# Patient Record
Sex: Male | Born: 1940
Health system: Southern US, Community
[De-identification: ages and names within clinical notes are randomized; demographics above are authoritative.]

## PROBLEM LIST (undated history)

## (undated) DIAGNOSIS — L2084 Intrinsic (allergic) eczema: Secondary | ICD-10-CM

## (undated) DIAGNOSIS — E119 Type 2 diabetes mellitus without complications: Secondary | ICD-10-CM

## (undated) DIAGNOSIS — R54 Age-related physical debility: Secondary | ICD-10-CM

## (undated) DIAGNOSIS — Z8719 Personal history of other diseases of the digestive system: Secondary | ICD-10-CM

## (undated) DIAGNOSIS — D649 Anemia, unspecified: Secondary | ICD-10-CM

## (undated) DIAGNOSIS — J449 Chronic obstructive pulmonary disease, unspecified: Secondary | ICD-10-CM

## (undated) DIAGNOSIS — E669 Obesity, unspecified: Secondary | ICD-10-CM

## (undated) DIAGNOSIS — I2510 Atherosclerotic heart disease of native coronary artery without angina pectoris: Secondary | ICD-10-CM

## (undated) DIAGNOSIS — R0600 Dyspnea, unspecified: Secondary | ICD-10-CM

## (undated) DIAGNOSIS — I480 Paroxysmal atrial fibrillation: Secondary | ICD-10-CM

## (undated) DIAGNOSIS — R2689 Other abnormalities of gait and mobility: Secondary | ICD-10-CM

## (undated) DIAGNOSIS — J309 Allergic rhinitis, unspecified: Secondary | ICD-10-CM

## (undated) DIAGNOSIS — Z9289 Personal history of other medical treatment: Secondary | ICD-10-CM

## (undated) DIAGNOSIS — M4722 Other spondylosis with radiculopathy, cervical region: Secondary | ICD-10-CM

## (undated) DIAGNOSIS — H548 Legal blindness, as defined in USA: Secondary | ICD-10-CM

## (undated) DIAGNOSIS — I5032 Chronic diastolic (congestive) heart failure: Secondary | ICD-10-CM

## (undated) DIAGNOSIS — Z8673 Personal history of transient ischemic attack (TIA), and cerebral infarction without residual deficits: Secondary | ICD-10-CM

## (undated) DIAGNOSIS — Z8711 Personal history of peptic ulcer disease: Secondary | ICD-10-CM

## (undated) DIAGNOSIS — R339 Retention of urine, unspecified: Secondary | ICD-10-CM

## (undated) DIAGNOSIS — Z8709 Personal history of other diseases of the respiratory system: Secondary | ICD-10-CM

## (undated) DIAGNOSIS — I10 Essential (primary) hypertension: Secondary | ICD-10-CM

## (undated) DIAGNOSIS — E559 Vitamin D deficiency, unspecified: Secondary | ICD-10-CM

## (undated) DIAGNOSIS — J189 Pneumonia, unspecified organism: Secondary | ICD-10-CM

## (undated) DIAGNOSIS — E7800 Pure hypercholesterolemia, unspecified: Secondary | ICD-10-CM

## (undated) DIAGNOSIS — K219 Gastro-esophageal reflux disease without esophagitis: Secondary | ICD-10-CM

## (undated) DIAGNOSIS — R531 Weakness: Secondary | ICD-10-CM

## (undated) DIAGNOSIS — I4892 Unspecified atrial flutter: Secondary | ICD-10-CM

## (undated) DIAGNOSIS — M1990 Unspecified osteoarthritis, unspecified site: Secondary | ICD-10-CM

## (undated) DIAGNOSIS — Z87442 Personal history of urinary calculi: Secondary | ICD-10-CM

## (undated) HISTORY — DX: Type 2 diabetes mellitus without complications: E11.9

## (undated) HISTORY — DX: Chronic diastolic (congestive) heart failure: I50.32

## (undated) HISTORY — DX: Other spondylosis with radiculopathy, cervical region: M47.22

## (undated) HISTORY — PX: YAG LASER APPLICATION: SHX6189

## (undated) HISTORY — DX: Unspecified atrial flutter: I48.92

## (undated) HISTORY — PX: CATARACT EXTRACTION W/ INTRAOCULAR LENS  IMPLANT, BILATERAL: SHX1307

## (undated) HISTORY — DX: Paroxysmal atrial fibrillation: I48.0

## (undated) HISTORY — DX: Chronic obstructive pulmonary disease, unspecified: J44.9

---

## 1980-03-27 DIAGNOSIS — Z9289 Personal history of other medical treatment: Secondary | ICD-10-CM

## 1980-03-27 HISTORY — DX: Personal history of other medical treatment: Z92.89

## 1980-03-27 HISTORY — PX: PARTIAL GASTRECTOMY: SHX2172

## 1996-03-27 HISTORY — PX: INCISION AND DRAINAGE ABSCESS / HEMATOMA OF BURSA / KNEE / THIGH: SUR668

## 1996-03-27 HISTORY — PX: KNEE BURSECTOMY: SHX5882

## 2010-03-23 ENCOUNTER — Inpatient Hospital Stay (HOSPITAL_COMMUNITY)
Admission: EM | Admit: 2010-03-23 | Discharge: 2010-03-25 | Payer: Self-pay | Source: Home / Self Care | Attending: Internal Medicine | Admitting: Internal Medicine

## 2010-03-25 ENCOUNTER — Encounter (INDEPENDENT_AMBULATORY_CARE_PROVIDER_SITE_OTHER): Payer: Self-pay | Admitting: Internal Medicine

## 2010-03-27 HISTORY — PX: CARDIAC CATHETERIZATION: SHX172

## 2010-03-27 HISTORY — PX: LAPAROSCOPIC CHOLECYSTECTOMY: SUR755

## 2010-04-08 ENCOUNTER — Inpatient Hospital Stay (HOSPITAL_COMMUNITY)
Admission: EM | Admit: 2010-04-08 | Discharge: 2010-04-12 | Payer: Self-pay | Source: Home / Self Care | Attending: Cardiology | Admitting: Cardiology

## 2010-04-11 LAB — CARDIAC PANEL(CRET KIN+CKTOT+MB+TROPI)
CK, MB: 2.2 ng/mL (ref 0.3–4.0)
CK, MB: 2.2 ng/mL (ref 0.3–4.0)
Relative Index: INVALID (ref 0.0–2.5)
Relative Index: INVALID (ref 0.0–2.5)
Total CK: 95 U/L (ref 7–232)
Total CK: 96 U/L (ref 7–232)
Troponin I: <0.01 ng/mL (ref 0.00–0.06)
Troponin I: 0.01 ng/mL (ref 0.00–0.06)

## 2010-04-11 LAB — COMPREHENSIVE METABOLIC PANEL
ALT: 23 U/L (ref 0–53)
AST: 22 U/L (ref 0–37)
Albumin: 3.7 g/dL (ref 3.5–5.2)
Alkaline Phosphatase: 71 U/L (ref 39–117)
BUN: 11 mg/dL (ref 6–23)
CO2: 24 mEq/L (ref 19–32)
Calcium: 9.3 mg/dL (ref 8.4–10.5)
Chloride: 104 mEq/L (ref 96–112)
Creatinine, Ser: 0.98 mg/dL (ref 0.4–1.5)
GFR calc Af Amer: >60 mL/min (ref >60)
GFR calc non Af Amer: >60 mL/min (ref >60)
Glucose, Bld: 118 mg/dL — ABNORMAL HIGH (ref 70–99)
Potassium: 4.6 mEq/L (ref 3.5–5.1)
Sodium: 139 mEq/L (ref 135–145)
Total Bilirubin: 0.3 mg/dL (ref 0.3–1.2)
Total Protein: 6.8 g/dL (ref 6.0–8.3)

## 2010-04-11 LAB — CBC
HCT: 48.3 % (ref 39.0–52.0)
HCT: 46 % (ref 39.0–52.0)
HCT: 47.8 % (ref 39.0–52.0)
Hemoglobin: 16.4 g/dL (ref 13.0–17.0)
Hemoglobin: 15.1 g/dL (ref 13.0–17.0)
Hemoglobin: 15.8 g/dL (ref 13.0–17.0)
MCH: 29.2 pg (ref 26.0–34.0)
MCH: 28.6 pg (ref 26.0–34.0)
MCH: 28.5 pg (ref 26.0–34.0)
MCHC: 34 g/dL (ref 30.0–36.0)
MCHC: 32.8 g/dL (ref 30.0–36.0)
MCHC: 33.1 g/dL (ref 30.0–36.0)
MCV: 85.9 fL (ref 78.0–100.0)
MCV: 87.1 fL (ref 78.0–100.0)
MCV: 86.3 fL (ref 78.0–100.0)
Platelets: 226 10*3/uL (ref 150–400)
Platelets: 201 10*3/uL (ref 150–400)
Platelets: 178 10*3/uL (ref 150–400)
RBC: 5.62 MIL/uL (ref 4.22–5.81)
RBC: 5.28 MIL/uL (ref 4.22–5.81)
RBC: 5.54 MIL/uL (ref 4.22–5.81)
RDW: 13.5 % (ref 11.5–15.5)
RDW: 13.8 % (ref 11.5–15.5)
RDW: 13.6 % (ref 11.5–15.5)
WBC: 10.5 10*3/uL (ref 4.0–10.5)
WBC: 8.7 10*3/uL (ref 4.0–10.5)
WBC: 7.3 10*3/uL (ref 4.0–10.5)

## 2010-04-11 LAB — DIFFERENTIAL
Basophils Absolute: 0.1 10*3/uL (ref 0.0–0.1)
Basophils Relative: 1 % (ref 0–1)
Eosinophils Absolute: 0.3 10*3/uL (ref 0.0–0.7)
Eosinophils Relative: 3 % (ref 0–5)
Lymphocytes Relative: 30 % (ref 12–46)
Lymphs Abs: 3.2 10*3/uL (ref 0.7–4.0)
Monocytes Absolute: 0.9 10*3/uL (ref 0.1–1.0)
Monocytes Relative: 9 % (ref 3–12)
Neutro Abs: 6.2 10*3/uL (ref 1.7–7.7)
Neutrophils Relative %: 58 % (ref 43–77)

## 2010-04-11 LAB — HEPARIN LEVEL (UNFRACTIONATED)
Heparin Unfractionated: <0.1 IU/mL — ABNORMAL LOW (ref 0.30–0.70)
Heparin Unfractionated: 0.33 IU/mL (ref 0.30–0.70)
Heparin Unfractionated: 0.28 IU/mL — ABNORMAL LOW (ref 0.30–0.70)
Heparin Unfractionated: 0.14 IU/mL — ABNORMAL LOW (ref 0.30–0.70)
Heparin Unfractionated: 0.41 IU/mL (ref 0.30–0.70)
Heparin Unfractionated: 0.42 IU/mL (ref 0.30–0.70)

## 2010-04-11 LAB — GLUCOSE, CAPILLARY
Glucose-Capillary: 149 mg/dL — ABNORMAL HIGH (ref 70–99)
Glucose-Capillary: 149 mg/dL — ABNORMAL HIGH (ref 70–99)
Glucose-Capillary: 149 mg/dL — ABNORMAL HIGH (ref 70–99)
Glucose-Capillary: 201 mg/dL — ABNORMAL HIGH (ref 70–99)
Glucose-Capillary: 132 mg/dL — ABNORMAL HIGH (ref 70–99)
Glucose-Capillary: 157 mg/dL — ABNORMAL HIGH (ref 70–99)
Glucose-Capillary: 151 mg/dL — ABNORMAL HIGH (ref 70–99)
Glucose-Capillary: 135 mg/dL — ABNORMAL HIGH (ref 70–99)
Glucose-Capillary: 187 mg/dL — ABNORMAL HIGH (ref 70–99)
Glucose-Capillary: 193 mg/dL — ABNORMAL HIGH (ref 70–99)

## 2010-04-11 LAB — POCT CARDIAC MARKERS
CKMB, poc: <1 ng/mL — ABNORMAL LOW (ref 1.0–8.0)
Myoglobin, poc: 52.9 ng/mL (ref 12–200)
Troponin i, poc: <0.05 ng/mL (ref 0.00–0.09)

## 2010-04-11 LAB — CK TOTAL AND CKMB (NOT AT ARMC)
CK, MB: 2.3 ng/mL (ref 0.3–4.0)
Relative Index: 2.1 (ref 0.0–2.5)
Total CK: 109 U/L (ref 7–232)

## 2010-04-11 LAB — LIPID PANEL
Cholesterol: 151 mg/dL (ref 0–200)
HDL: 37 mg/dL — ABNORMAL LOW (ref >39)
LDL Cholesterol: 77 mg/dL (ref 0–99)
Total CHOL/HDL Ratio: 4.1 RATIO
Triglycerides: 185 mg/dL — ABNORMAL HIGH (ref <150)
VLDL: 37 mg/dL (ref 0–40)

## 2010-04-11 LAB — BRAIN NATRIURETIC PEPTIDE: Pro B Natriuretic peptide (BNP): <30 pg/mL (ref 0.0–100.0)

## 2010-04-11 LAB — MAGNESIUM: Magnesium: 2.3 mg/dL (ref 1.5–2.5)

## 2010-04-11 LAB — PROTIME-INR
INR: 0.87 (ref 0.00–1.49)
Prothrombin Time: 12 seconds (ref 11.6–15.2)

## 2010-04-11 LAB — HEMOGLOBIN A1C
Hgb A1c MFr Bld: 7 % — ABNORMAL HIGH (ref <5.7)
Mean Plasma Glucose: 154 mg/dL — ABNORMAL HIGH (ref <117)

## 2010-04-11 LAB — TROPONIN I: Troponin I: <0.01 ng/mL (ref 0.00–0.06)

## 2010-04-11 LAB — TSH: TSH: 2.638 u[IU]/mL (ref 0.350–4.500)

## 2010-04-11 LAB — APTT: aPTT: 30 seconds (ref 24–37)

## 2010-04-13 LAB — CBC
HCT: 47.8 % (ref 39.0–52.0)
HCT: 44.3 % (ref 39.0–52.0)
Hemoglobin: 15.9 g/dL (ref 13.0–17.0)
Hemoglobin: 14.8 g/dL (ref 13.0–17.0)
MCH: 28.8 pg (ref 26.0–34.0)
MCH: 28.5 pg (ref 26.0–34.0)
MCHC: 33.3 g/dL (ref 30.0–36.0)
MCHC: 33.4 g/dL (ref 30.0–36.0)
MCV: 86.6 fL (ref 78.0–100.0)
MCV: 85.2 fL (ref 78.0–100.0)
Platelets: 207 10*3/uL (ref 150–400)
Platelets: 186 10*3/uL (ref 150–400)
RBC: 5.52 MIL/uL (ref 4.22–5.81)
RBC: 5.2 MIL/uL (ref 4.22–5.81)
RDW: 13.7 % (ref 11.5–15.5)
RDW: 13.5 % (ref 11.5–15.5)
WBC: 8.5 10*3/uL (ref 4.0–10.5)
WBC: 12.6 10*3/uL — ABNORMAL HIGH (ref 4.0–10.5)

## 2010-04-13 LAB — PROTIME-INR
INR: 0.89 (ref 0.00–1.49)
INR: 0.98 (ref 0.00–1.49)
Prothrombin Time: 12.2 seconds (ref 11.6–15.2)
Prothrombin Time: 13.2 seconds (ref 11.6–15.2)

## 2010-04-13 LAB — BASIC METABOLIC PANEL
BUN: 15 mg/dL (ref 6–23)
BUN: 21 mg/dL (ref 6–23)
CO2: 27 mEq/L (ref 19–32)
CO2: 25 mEq/L (ref 19–32)
Calcium: 9.2 mg/dL (ref 8.4–10.5)
Calcium: 8.9 mg/dL (ref 8.4–10.5)
Chloride: 102 mEq/L (ref 96–112)
Chloride: 102 mEq/L (ref 96–112)
Creatinine, Ser: 1.03 mg/dL (ref 0.4–1.5)
Creatinine, Ser: 1.17 mg/dL (ref 0.4–1.5)
GFR calc Af Amer: >60 mL/min (ref >60)
GFR calc Af Amer: >60 mL/min (ref >60)
GFR calc non Af Amer: >60 mL/min (ref >60)
GFR calc non Af Amer: >60 mL/min (ref >60)
Glucose, Bld: 142 mg/dL — ABNORMAL HIGH (ref 70–99)
Glucose, Bld: 156 mg/dL — ABNORMAL HIGH (ref 70–99)
Potassium: 4.5 mEq/L (ref 3.5–5.1)
Potassium: 4.1 mEq/L (ref 3.5–5.1)
Sodium: 140 mEq/L (ref 135–145)
Sodium: 136 mEq/L (ref 135–145)

## 2010-04-13 LAB — GLUCOSE, CAPILLARY
Glucose-Capillary: 149 mg/dL — ABNORMAL HIGH (ref 70–99)
Glucose-Capillary: 127 mg/dL — ABNORMAL HIGH (ref 70–99)
Glucose-Capillary: 111 mg/dL — ABNORMAL HIGH (ref 70–99)
Glucose-Capillary: 122 mg/dL — ABNORMAL HIGH (ref 70–99)
Glucose-Capillary: 107 mg/dL — ABNORMAL HIGH (ref 70–99)
Glucose-Capillary: 182 mg/dL — ABNORMAL HIGH (ref 70–99)
Glucose-Capillary: 141 mg/dL — ABNORMAL HIGH (ref 70–99)
Glucose-Capillary: 108 mg/dL — ABNORMAL HIGH (ref 70–99)

## 2010-04-13 LAB — HEPARIN LEVEL (UNFRACTIONATED): Heparin Unfractionated: 0.84 IU/mL — ABNORMAL HIGH (ref 0.30–0.70)

## 2010-04-14 ENCOUNTER — Inpatient Hospital Stay (HOSPITAL_COMMUNITY)
Admission: EM | Admit: 2010-04-14 | Discharge: 2010-04-26 | Disposition: A | Discharge status: Home / Self Care | Payer: Self-pay | Source: Home / Self Care

## 2010-04-14 NOTE — Procedures (Signed)
NAMEENOCH, MOFFA             ACCOUNT NO.:  [id number]  MEDICAL RECORD NO.:  [id number]          PATIENT TYPE:  INP  LOCATION:  3702                         FACILITY:  [facility]  PHYSICIAN:  Thereasa Solo. Little, M.D. DATE OF BIRTH:  02/24/1941  DATE OF PROCEDURE:  04/11/2010 DATE OF DISCHARGE:                           CARDIAC CATHETERIZATION   The patient does not have a primary care doctor.  INDICATIONS FOR TEST:  This 70 year old male has had some palpitations and tachycardia or an event monitor that showed PAF.  When seen in the office, he had ST-segment elevation in the inferior leads and a nuclear study was positive for questionable inferior lateral ischemia.  Because of this, he was brought to the cath lab for a diagnostic cardiac catheterization.  After obtaining informed consent, the patient was prepped and draped in the usual sterile fashion exposing the right groin.  Following local anesthetic with 1% Xylocaine, the Seldinger technique was employed and a 5-French introducer sheath was placed in the right femoral artery.  Left and right coronary arteriography and ventriculography in the RAO projection was performed.  COMPLICATIONS:  None.  EQUIPMENT:  A 5-French Judkins configuration catheters.  TOTAL CONTRAST:  80 mL  RESULTS: 1. Hemodynamic monitoring:  His central aortic pressure was 193/59.     His left ventricular pressure was 87/5 with no significant gradient     noted at the time of pullback.  The left ventricular end-diastolic     pressure was 13. 2. Ventriculography:  Ventriculography in the RAO projection revealed     normal LV systolic function.  There was marked ventricular ectopy     during the ventriculogram, but there were no wall motion     abnormalities and his ejection fraction appeared to be in excess of     55%. 3. Coronary arteriography:  On fluoroscopy, I did not appreciate any     calcification.     a.     Left main normal.  It  trifurcated.     b.     Circumflex.  The circumflex was small and gave rise to a      small first OM.  This system was free of disease. 4. Ramus:  The ramus was a medium-sized vessel, free of disease. 5. LAD.  The LAD had minor irregularities in the proximal portion of     20-30%.  The diagonal 1 and diagonal 2 were free of disease and the     mid and distal LAD was free of disease. 6. Right coronary artery.  There was minor proximal irregularities.     The RCA was large and long, it gave rise to a PDA and several     posterior lateral vessels, all of which were free of disease.  CONCLUSIONS: 1. Normal LV systolic function. 2. No occlusive coronary artery disease. 3. Paroxysmal atrial fibrillation, currently in a sinus rhythm, rate     of 92 with relative low blood pressure.  I stopped his beta-blockers because of his body habitus, which appears to be that one as one of COPD.  I increased his Cardizem from 120-240. I think his  blood pressure will allow this without the beta-blockers.  I placed him on Cardizem 10 mg a day, ordered an INR for in the morning, and he may be ready for discharge tomorrow with his INR being checked as an outpatient.          ______________________________ Thereasa Solo. Little, M.D.     ABL/MEDQ  D:  04/11/2010  T:  04/12/2010  Job:  [id number]  cc:   Landry Corporal, MD  Electronically Signed by Julieanne Manson M.D. on 04/14/2010 02:32:31 PM

## 2010-04-16 NOTE — H&P (Signed)
[name], [name]             ACCOUNT NO.:  [id number]  MEDICAL RECORD NO.:  [id number]          PATIENT TYPE:  INP  LOCATION:  1824                         FACILITY:  [facility]  PHYSICIAN:  Ruthy Dick, MD    DATE OF BIRTH:  05-07-1940  DATE OF ADMISSION:  04/14/2010 DATE OF DISCHARGE:                             HISTORY & PHYSICAL   The patient seen and examined in the emergency room.  PRIMARY CARDIOLOGIST:  Landry Corporal, MD.  CHIEF COMPLAINT:  Abdominal pain.  HISTORY OF PRESENT ILLNESS:  Mr. Hiney is a pleasant 70 year old Caucasian male with past medical history significant for diabetes mellitus type 2, hypertension, recent admission for chest pain and abnormal EKG during which the patient had a cardiac catheterization showing nonobstructive coronary artery disease and also has atrial flutter and COPD.  This patient said at the time he was discharged from the hospital around April 12, 2010, that he started having some mild abdominal pain which gradually progressed to severe abdominal pain by the time he got home.  According to him,  though the pain started in the hospital, he did not think it was anything until it got to this morning when he was kind of rolling over with pain.  He says the pain is about 8/10 and it goes to both flanks and to the back sometimes. Interestingly he has not had any nausea or vomiting during this time. No diarrhea and no constipation during this time.  No melanotic stool either.  He says he has not been a drinker in his life.  Admits to smoking cigarettes but also denies illicit drug use.  He says the pain kind of gets worse with eating but there is no alleviating  factor for the pain.  REVIEW OF SYSTEMS:  The patient denies dysuria, frequency, urgency. Denies palpitation, photophobia, diplopia.  Denies dysuria or frequency. Denies syncope.  Denies any new skin rash.  Also denies claudication.  PAST MEDICAL HISTORY: 1.  Supraventricular tachycardia likely atrial flutter on Coumadin. 2. Nonobstructive coronary artery disease. 3. COPD. 4. Diastolic dysfunction. 5. Hypertension. 6. Diabetes mellitus type 2. 7. Hyperlipidemia.  SOCIAL HISTORY:  Lives at home.  Smokes a pack of cigarettes per day since the age of 83.  Denies alcohol and illicit drug use.  ALLERGIES:  NO KNOWN DRUG ALLERGIES EVEN THOUGH TYLENOL IS LISTED AMONG HIS ALLERGIES.  HE SAYS THIS IS NOT AN ALLERGY FOR HIM.  ACTUALLY HE TAKES TYLENOL AT HOME.  MEDICATIONS:  During last admission, the patient was discharged on: 1. Tylenol. 2. Cardizem. 3. Hydrocortisone cream. 4. Milk of magnesia. 5. Protonix. 6. Coumadin. 7. Aspirin. 8. Cyclobenzaprine. 9. Lisinopril. 10.Loratadine. 11.Metformin. 12.Pravastatin. 13.Lopressor.  FAMILY HISTORY:  Mother died at the age of 32 from breast cancer. Father died at the age of 5 with heart attack and he has had a heart attack earlier than that.  He was also a long-term smoker and he has two brothers and three sisters.  One older brother died of a heart attack at the age of 51; also a smoker.  PHYSICAL EXAMINATION:  GENERAL APPEARANCE:  The patient seen and examined in  the emergency room.  He is alert, oriented x3, in no acute cardiopulmonary distress and no painful distress either. HEENT: Normocephalic, atraumatic.  Pupils equal, round, reactive to light. VITAL SIGNS:  Temperature 98.2, pulse 110, respiration 20, blood pressure 139/76 and saturating 99% on room air. NECK:  Supple.  No JVD.  No lymphadenopathy.  No thyromegaly. CHEST:  Clear to auscultation bilaterally. ABDOMEN: Soft.  No rebound.  No guarding.  But patient does have some tenderness mostly in the epigastric region on deep palpation.  Bowel sounds present and normoactive. EXTREMITIES: No clubbing, no cyanosis, no edema. CARDIOVASCULAR:  First and second heart sounds only irregular. CENTRAL NERVOUS SYSTEM:  Nonfocal.  Cranial  nerves intact II-XII.  The patient has normal reflexes and power is 5/5 globally.  LABS AND INVESTIGATIONS:  WBC is 13.4, hemoglobin 15, hematocrit 44, INR is 1.50, glucose is 175.  Sodium is 137, potassium is 4.5, BUN 12, creatinine 1.05.  Liver function test is within normal limits.  Lipase is 561, lactic acid is 1.5.  Troponin is 0.03.  Urinalysis shows a small leukocyte esterase but only 0-2 WBCs.  CT scan of the abdomen and pelvis shows mild inflammatory changes surrounding the body and the tail of the pancreas consistent with pancreatitis and there is no complicating features such as abscess of pancreatic necrosis identified.  There may be  some subtle gallstones in the gallbladder.  ASSESSMENT: 1. Acute pancreatitis, etiology not very clear at this time. 2. Dyslipidemia. 3. Supraventricular tachycardia likely secondary to atrial flutter. 4. Nonocclusive coronary artery disease. 5. Chronic obstructive pulmonary disease. 6. Diastolic dysfunction. 7. Hypertension. 8. Diabetes mellitus type 2. 9. Coagulopathy with Coumadin therapy. 10.Tobacco abuse.  PLAN OF CARE:  This patient will be admitted for acute pancreatitis.  We are going to have him on n.p.o. status and we are going to start him on a D5 half-normal saline IV fluid and because he is a diabetic, we are going to also have him on an insulin drip to keep his sugar manageable. Pain  management will be instituted and DVT prophylaxis will be provided.  Coumadin will be continued.  As far as the patient's pancreatitis is concerned, we are not quite sure at this time what the cause is.  I have reviewed his medications and the only thing that has a weak association to pancreatitis is lisinopril.  I am not going to stop this medication at this time since the suspicion is very weak and this patient is a cardiac patient.  However, if on further review and discussion with cardiology this medication is not as necessary, then this  may be discontinued.  But I have to repeat that this is a weak association.  We are going to get a right upper quadrant ultrasound to evaluate for gallstones even though there was no clear mention of gallstones in the CT scan of the abdomen.  They did, however, mention about possible subtle gallstones at that time.  Recently the patient had evaluation in the hospital and, at that time, his triglyceride was around 185 which makes this unlikely to be the cause of patient's pancreatitis and there was also the remote possibility of this being an atheromatous embolus postop cardiac catheterization.  But I will defer this to the rounding attending.  If for any reason the patient's pancreatitis is not getting any better then the cardiology may be asked to evaluate for this.  But with n.p.o. status, we are hoping that his pancreatitis will start getting better  and then we may not have to dig further.  We may have to evaluate this as being idiopathic pancreatitis. In any case, we will continue the other outpatient medications for this patient and any consults will be at the discretion of the rounding attending.  Plan of care has been discussed in detail with the patient. He is in full agreement and plans to comply.  He wishes to be full code and we will respect his wishes.  Total time used for admitting this patient is 1 hour.     Ruthy Dick, MD     GU/MEDQ  D:  04/14/2010  T:  04/14/2010  Job:  [id number]  Electronically Signed by Ruthy Dick  on 04/16/2010 11:25:25 AM

## 2010-04-18 LAB — URINALYSIS, ROUTINE W REFLEX MICROSCOPIC
Nitrite: NEGATIVE
Protein, ur: NEGATIVE mg/dL
Urine Glucose, Fasting: NEGATIVE mg/dL
Urobilinogen, UA: 1 mg/dL (ref 0.0–1.0)

## 2010-04-18 LAB — GLUCOSE, CAPILLARY
Glucose-Capillary: 110 mg/dL — ABNORMAL HIGH (ref 70–99)
Glucose-Capillary: 104 mg/dL — ABNORMAL HIGH (ref 70–99)
Glucose-Capillary: 98 mg/dL (ref 70–99)

## 2010-04-18 LAB — DIFFERENTIAL
Basophils Absolute: 0 10*3/uL (ref 0.0–0.1)
Basophils Relative: 0 % (ref 0–1)
Eosinophils Absolute: 0.2 10*3/uL (ref 0.0–0.7)
Eosinophils Relative: 2 % (ref 0–5)
Lymphocytes Relative: 17 % (ref 12–46)
Lymphs Abs: 2.2 10*3/uL (ref 0.7–4.0)
Monocytes Absolute: 1.3 10*3/uL — ABNORMAL HIGH (ref 0.1–1.0)
Monocytes Relative: 10 % (ref 3–12)
Neutro Abs: 9.6 10*3/uL — ABNORMAL HIGH (ref 1.7–7.7)
Neutrophils Relative %: 72 % (ref 43–77)

## 2010-04-18 LAB — CBC
HCT: 44.2 % (ref 39.0–52.0)
HCT: 43.1 % (ref 39.0–52.0)
Hemoglobin: 15.1 g/dL (ref 13.0–17.0)
Hemoglobin: 14.2 g/dL (ref 13.0–17.0)
MCH: 29.3 pg (ref 26.0–34.0)
MCHC: 34.2 g/dL (ref 30.0–36.0)
MCHC: 32.9 g/dL (ref 30.0–36.0)
MCV: 85.7 fL (ref 78.0–100.0)
MCV: 86 fL (ref 78.0–100.0)
Platelets: 206 10*3/uL (ref 150–400)
RBC: 5.16 MIL/uL (ref 4.22–5.81)
RDW: 13.6 % (ref 11.5–15.5)
RDW: 13.8 % (ref 11.5–15.5)
WBC: 13.4 10*3/uL — ABNORMAL HIGH (ref 4.0–10.5)
WBC: 12.8 10*3/uL — ABNORMAL HIGH (ref 4.0–10.5)

## 2010-04-18 LAB — COMPREHENSIVE METABOLIC PANEL
Alkaline Phosphatase: 79 U/L (ref 39–117)
BUN: 12 mg/dL (ref 6–23)
Chloride: 98 mEq/L (ref 96–112)
Glucose, Bld: 175 mg/dL — ABNORMAL HIGH (ref 70–99)
Potassium: 4.5 mEq/L (ref 3.5–5.1)
Total Bilirubin: 0.3 mg/dL (ref 0.3–1.2)

## 2010-04-18 LAB — BASIC METABOLIC PANEL
CO2: 28 mEq/L (ref 19–32)
Chloride: 100 mEq/L (ref 96–112)
Creatinine, Ser: 1 mg/dL (ref 0.4–1.5)
GFR calc Af Amer: >60 mL/min (ref >60)
Potassium: 4.9 mEq/L (ref 3.5–5.1)

## 2010-04-18 LAB — MRSA PCR SCREENING: MRSA by PCR: NEGATIVE

## 2010-04-18 LAB — HEPATIC FUNCTION PANEL
ALT: 21 U/L (ref 0–53)
AST: 16 U/L (ref 0–37)
Albumin: 3.2 g/dL — ABNORMAL LOW (ref 3.5–5.2)
Bilirubin, Direct: 0.1 mg/dL (ref 0.0–0.3)

## 2010-04-18 LAB — PROTIME-INR
INR: 1.5 — ABNORMAL HIGH (ref 0.00–1.49)
Prothrombin Time: 18.3 seconds — ABNORMAL HIGH (ref 11.6–15.2)

## 2010-04-18 LAB — LIPASE, BLOOD: Lipase: 561 U/L — ABNORMAL HIGH (ref 11–59)

## 2010-04-18 LAB — URINE MICROSCOPIC-ADD ON

## 2010-04-18 LAB — TROPONIN I: Troponin I: 0.03 ng/mL (ref 0.00–0.06)

## 2010-04-19 LAB — GLUCOSE, CAPILLARY
Glucose-Capillary: 113 mg/dL — ABNORMAL HIGH (ref 70–99)
Glucose-Capillary: 110 mg/dL — ABNORMAL HIGH (ref 70–99)
Glucose-Capillary: 91 mg/dL (ref 70–99)
Glucose-Capillary: 83 mg/dL (ref 70–99)
Glucose-Capillary: 81 mg/dL (ref 70–99)
Glucose-Capillary: 77 mg/dL (ref 70–99)

## 2010-04-19 LAB — CBC
Hemoglobin: 12.8 g/dL — ABNORMAL LOW (ref 13.0–17.0)
Hemoglobin: 13.3 g/dL (ref 13.0–17.0)
MCH: 28.2 pg (ref 26.0–34.0)
MCHC: 32.9 g/dL (ref 30.0–36.0)
Platelets: 202 10*3/uL (ref 150–400)
RBC: 4.66 MIL/uL (ref 4.22–5.81)
RDW: 13.6 % (ref 11.5–15.5)

## 2010-04-19 LAB — BRAIN NATRIURETIC PEPTIDE
Pro B Natriuretic peptide (BNP): 254 pg/mL — ABNORMAL HIGH (ref 0.0–100.0)
Pro B Natriuretic peptide (BNP): 220 pg/mL — ABNORMAL HIGH (ref 0.0–100.0)

## 2010-04-19 LAB — LIPASE, BLOOD
Lipase: 103 U/L — ABNORMAL HIGH (ref 11–59)
Lipase: 48 U/L (ref 11–59)

## 2010-04-19 LAB — HEPATIC FUNCTION PANEL
ALT: 18 U/L (ref 0–53)
Alkaline Phosphatase: 65 U/L (ref 39–117)
Bilirubin, Direct: 0.2 mg/dL (ref 0.0–0.3)
Indirect Bilirubin: 0.5 mg/dL (ref 0.3–0.9)

## 2010-04-20 LAB — PROTIME-INR
INR: 1.57 — ABNORMAL HIGH (ref 0.00–1.49)
Prothrombin Time: 23.4 seconds — ABNORMAL HIGH (ref 11.6–15.2)
Prothrombin Time: 19 seconds — ABNORMAL HIGH (ref 11.6–15.2)

## 2010-04-20 LAB — CBC
Hemoglobin: 12.3 g/dL — ABNORMAL LOW (ref 13.0–17.0)
MCH: 28.1 pg (ref 26.0–34.0)
MCHC: 34.3 g/dL (ref 30.0–36.0)
MCV: 82.2 fL (ref 78.0–100.0)
Platelets: 248 10*3/uL (ref 150–400)

## 2010-04-20 LAB — GLUCOSE, CAPILLARY
Glucose-Capillary: 126 mg/dL — ABNORMAL HIGH (ref 70–99)
Glucose-Capillary: 129 mg/dL — ABNORMAL HIGH (ref 70–99)
Glucose-Capillary: 82 mg/dL (ref 70–99)
Glucose-Capillary: 121 mg/dL — ABNORMAL HIGH (ref 70–99)
Glucose-Capillary: 147 mg/dL — ABNORMAL HIGH (ref 70–99)
Glucose-Capillary: 126 mg/dL — ABNORMAL HIGH (ref 70–99)

## 2010-04-20 LAB — COMPREHENSIVE METABOLIC PANEL
Alkaline Phosphatase: 62 U/L (ref 39–117)
BUN: 7 mg/dL (ref 6–23)
CO2: 25 mEq/L (ref 19–32)
GFR calc non Af Amer: >60 mL/min (ref >60)
Glucose, Bld: 107 mg/dL — ABNORMAL HIGH (ref 70–99)
Potassium: 4 mEq/L (ref 3.5–5.1)
Total Protein: 5.9 g/dL — ABNORMAL LOW (ref 6.0–8.3)

## 2010-04-20 LAB — HEPARIN LEVEL (UNFRACTIONATED): Heparin Unfractionated: 0.15 IU/mL — ABNORMAL LOW (ref 0.30–0.70)

## 2010-04-20 LAB — LIPASE, BLOOD: Lipase: 45 U/L (ref 11–59)

## 2010-04-21 LAB — GLUCOSE, CAPILLARY
Glucose-Capillary: 86 mg/dL (ref 70–99)
Glucose-Capillary: 119 mg/dL — ABNORMAL HIGH (ref 70–99)
Glucose-Capillary: 138 mg/dL — ABNORMAL HIGH (ref 70–99)
Glucose-Capillary: 118 mg/dL — ABNORMAL HIGH (ref 70–99)
Glucose-Capillary: 133 mg/dL — ABNORMAL HIGH (ref 70–99)

## 2010-04-21 LAB — CBC
HCT: 38 % — ABNORMAL LOW (ref 39.0–52.0)
MCH: 28 pg (ref 26.0–34.0)
MCV: 83.2 fL (ref 78.0–100.0)
Platelets: 288 10*3/uL (ref 150–400)
RDW: 13.1 % (ref 11.5–15.5)
WBC: 7 10*3/uL (ref 4.0–10.5)

## 2010-04-21 LAB — CROSSMATCH
ABO/RH(D): A NEG
Antibody Screen: NEGATIVE

## 2010-04-21 NOTE — Discharge Summary (Addendum)
  [name], [name]             ACCOUNT NO.:  [id number]  MEDICAL RECORD NO.:  [id number]          PATIENT TYPE:  INP  LOCATION:  3702                         FACILITY:  [facility]  PHYSICIAN:  Landry Corporal, MDDATE OF BIRTH:  July 19, 1940  DATE OF ADMISSION:  04/08/2010 DATE OF DISCHARGE:  04/12/2010                              DISCHARGE SUMMARY   DISCHARGE DIAGNOSES: 1. Tachycardia, probably atrial flutter. 2. Dyspnea on exertion, secondary to tachycardia and chronic     obstructive pulmonary disease. 3. Abnormal EKG and abnormal Myoview, no significant coronary artery     disease at catheterization this admission. 4. Chronic obstructive pulmonary disease by history. 5. Good left ventricular function with diastolic dysfunction. 6. Treated hypertension. 7. Type 2 diabetes.  HOSPITAL COURSE:  The patient is a 70 year old male who was seen by Dr. Herbie Baltimore in the office for tachycardia and dyspnea.  The patient has a past history of tachycardia and was hospitalized in [place] in the past.  He is not having chest pain.  He does have risk factors for coronary artery disease.  His EKG in the office showed ST elevation in the inferior leads.  Telemetry and a monitor showed PSVT, PAT or MAT. He was admitted by Dr. Herbie Baltimore, put on heparin, and set up for Myoview. This was abnormal suggesting inferior scar and ischemia with an EF of 38%.  Echocardiogram had showed good LV function with no wall motion abnormality.  He was set up for diagnostic catheterization which was done on April 11, 2010.  He had no significant coronary artery disease, he had a 20% LAD narrowing.  EF was 55% with normal LV function.  Dr. Clarene Duke thought it will be best to avoid beta-blocker, so we started Cardizem.  He also feels the patient is going to require Coumadin for now.  Dr. Lynnea Ferrier saw the patient on January 17 and discussed with Dr. Herbie Baltimore further evaluation of his atrial flutter. Dr. Herbie Baltimore  would like him evaluated by Dr. Johney Frame as an outpatient for possible ablation.  This will be arranged.  We feel he can be discharged on April 12, 2010 and follow up with Dr. Herbie Baltimore and Dr. Johney Frame as an outpatient.  He will have an INR checked on Friday.  LABORATORY DATA:  White count 12.6, hemoglobin 14.8, hematocrit 44.3, and platelets 186.  INR 0.98.  Sodium 136, potassium 4.1, BUN 21, and creatinine 1.17.  DISPOSITION:  The patient is discharged in stable condition, see med rec for complete discharge medications.     Abelino Derrick, P.A.   ______________________________ Landry Corporal, MD    LKK/MEDQ  D:  04/12/2010  T:  04/12/2010  Job:  [id number]  cc:   Hillis Range, MD  Electronically Signed by Corine Shelter P.A. on 04/19/2010 09:54:29 AM Electronically Signed by Bryan Lemma MD on 04/21/2010 11:22:33 PM

## 2010-04-22 LAB — CBC
HCT: 40.3 % (ref 39.0–52.0)
Hemoglobin: 13.4 g/dL (ref 13.0–17.0)
MCH: 28 pg (ref 26.0–34.0)
MCV: 85.3 fL (ref 78.0–100.0)
Platelets: 301 10*3/uL (ref 150–400)
RBC: 4.71 MIL/uL (ref 4.22–5.81)
RDW: 13.5 % (ref 11.5–15.5)
RDW: 13.4 % (ref 11.5–15.5)
WBC: 10.7 10*3/uL — ABNORMAL HIGH (ref 4.0–10.5)
WBC: 10.6 10*3/uL — ABNORMAL HIGH (ref 4.0–10.5)

## 2010-04-22 LAB — GLUCOSE, CAPILLARY
Glucose-Capillary: 150 mg/dL — ABNORMAL HIGH (ref 70–99)
Glucose-Capillary: 118 mg/dL — ABNORMAL HIGH (ref 70–99)
Glucose-Capillary: 139 mg/dL — ABNORMAL HIGH (ref 70–99)
Glucose-Capillary: 133 mg/dL — ABNORMAL HIGH (ref 70–99)

## 2010-04-22 LAB — PROTIME-INR
INR: 1.04 (ref 0.00–1.49)
Prothrombin Time: 13.8 seconds (ref 11.6–15.2)

## 2010-04-22 LAB — BASIC METABOLIC PANEL
BUN: 8 mg/dL (ref 6–23)
Creatinine, Ser: 0.96 mg/dL (ref 0.4–1.5)
GFR calc Af Amer: >60 mL/min (ref >60)
GFR calc non Af Amer: >60 mL/min (ref >60)

## 2010-04-22 NOTE — H&P (Signed)
[name], [name]             ACCOUNT NO.:  [id number]  MEDICAL RECORD NO.:  [id number]          PATIENT TYPE:  INP  LOCATION:  1823                         FACILITY:  [facility]  PHYSICIAN:  Massie Maroon, MD        DATE OF BIRTH:  October 17, 1940  DATE OF ADMISSION:  03/23/2010 DATE OF DISCHARGE:                             HISTORY & PHYSICAL   CHIEF COMPLAINT:  Tachycardia, palpitation.  HISTORY OF PRESENT ILLNESS:  A 70 year old male with a history of palpitations in the past complains of palpitations yesterday morning. He noted that his heart rate was about 116 to 120.  He woke up this morning and his heart rate was still elevated and so presented to the ED for evaluation.  The patient himself denies any fever, chills, chest pain, nausea, vomiting, diarrhea, bright red blood per rectum or black stool.  He does note occasional dry cough and some shortness of breath. He denies any hemoptysis.  The patient's EKG shows sinus tachycardia at 130, borderline left axis deviation, early R-wave progression, no ST, T segment changes consistent with acute ischemia.  Chest x-ray was negative for any acute process, other than chronic bronchitic changes and chronic interstitial lung disease.  The patient does have diffuse osteopenia on x-ray.  Cardiac markers are negative. The patient will be admitted for tachycardia, as well as shortness of breath.  PAST MEDICAL HISTORY: 1. Diabetes type 2. 2. Hypertension. 3. Hyperlipidemia. 4. History of cervical radiculopathy. 5. History of palpitations.  PAST SURGICAL HISTORY: 1. Peptic ulcer disease surgery. 2. Left knee surgery x2 complicated by extensive cellulitis.  SOCIAL HISTORY:  The patient smokes one pack per day times 50 years.  He does not drink.  He lives in [place].  He just moved here from [place], [name].  He lives with his daughter.  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  FAMILY HISTORY:  Mother died at age 29 from breast  cancer.  She was a nonsmoker.  She had a history of thyroid problems.  Father died at age 58 from a heart attack.  He had an earlier heart attack and was a smoker.  The patient has 2 brothers and 3 sisters and 1 older brother died of heart attack at age 55 and was a smoker.  ALLERGIES:  TYLENOL.  MEDICATIONS: 1. Lisinopril  10 mg p.o. daily. 2. Sublingual nitroglycerin as needed. 3. Flexeril 10 mg p.o. b.i.d. p.r.n. 4. Pravastatin 20 mg p.o. q.h.s. 5. Cardizem 120 mg p.o. daily. 6. Enteric-coated aspirin 81 mg p.o. daily. 7. Loratadine 10 mg p.o. daily. 8. Metformin 500 mg p.o. b.i.d.  REVIEW OF SYSTEMS:  Prior knee surgery; sounds like it was an I and D of some form of abscess and this was complicated by spreading cellulitis and a redo of his I and D.  The patient notes that he had had a nuclear stress test 9 years when he had palpitations in [name], when he was treated in [facility] in [place], [name].  Review of systems is otherwise negative for all 10 organ systems except for pertinent positives stated above.  PHYSICAL EXAM:  VITAL  SIGNS:  Temperature 98.0, pulse 131, blood pressure 113/74, pulse ox is 96% on room air. HEENT:  Anicteric. NECK:  No JVD, no bruit. HEART: Tachycardic S1, S2;  no murmurs, gallops or rubs. LUNGS:  The lungs are clear to auscultation bilaterally, prolonged expiratory phase. ABDOMEN:  Soft, obese, nontender, nondistended.  Positive bowel sounds. EXTREMITIES:  No cyanosis, clubbing or edema. SKIN:  No rashes. LYMPHATICS:  No adenopathy. NEUROLOGIC:  Exam nonfocal.  Cranial nerves II-XII intact.  Reflexes 2+, symmetric, diffuse with downgoing toes bilaterally.  Motor strength 5/5 in all for extremities, pinprick intact.  LABORATORY DATA:  WBC 9.7, hemoglobin 16.1, platelet count 198.  Sodium 135, potassium 4.4, BUN 12, creatinine 1.03. CPK 129, CK-MB 2.6, troponin-I 0.02.   Chest x-ray:  Mild chronic bronchitic  changes and chronic interstitial lung disease, no acute abnormality.  ASSESSMENT/PLAN: 1. Tachycardia:  Check CK, CK-MB, troponin-I q.6 h x3 sets.  Check a     TSH STAT, check cardiac 2-D echo.  Check D-dimer and if positive     then obtain a CT angio chest to rule out pulmonary embolus. 2. Type 2 diabetes:  Continue metformin 500 mg p.o. b.i.d.,     fingerstick blood sugars a.c. and h.s.  NovoLog sensitive sliding     scale with q.h.s. coverage. 3. Hypertension:  Continue diltiazem, lisinopril. 4. Hyperlipidemia:  Continue pravastatin. 5. Chronic interstitial lung disease changes on chest x-ray:  This may     need a CT chest in the future to evaluate chronic interstitial lung     disease.  Can have pulmonary workup as an outpatient once he is     seen by his PCP. 6. Deep venous thrombosis prophylaxis:  Lovenox 40 mg subcu daily.     Massie Maroon, MD     JYK/MEDQ  D:  03/23/2010  T:  03/23/2010  Job:  [id number]  cc:   Dr. Lynnea Ferrier  Electronically Signed by Pearson Grippe MD on 04/22/2010 08:44:59 PM

## 2010-04-23 LAB — RENAL FUNCTION PANEL
Albumin: 3.3 g/dL — ABNORMAL LOW (ref 3.5–5.2)
BUN: 7 mg/dL (ref 6–23)
Calcium: 9.2 mg/dL (ref 8.4–10.5)
Phosphorus: 4.4 mg/dL (ref 2.3–4.6)
Potassium: 4.7 mEq/L (ref 3.5–5.1)

## 2010-04-23 LAB — DIFFERENTIAL
Basophils Absolute: 0.1 10*3/uL (ref 0.0–0.1)
Basophils Relative: 1 % (ref 0–1)
Neutro Abs: 6.4 10*3/uL (ref 1.7–7.7)
Neutrophils Relative %: 62 % (ref 43–77)

## 2010-04-23 LAB — PROTIME-INR
INR: 1.17 (ref 0.00–1.49)
Prothrombin Time: 15.1 seconds (ref 11.6–15.2)

## 2010-04-23 LAB — CBC
Hemoglobin: 12.9 g/dL — ABNORMAL LOW (ref 13.0–17.0)
MCHC: 33.1 g/dL (ref 30.0–36.0)
RBC: 4.56 MIL/uL (ref 4.22–5.81)

## 2010-04-23 LAB — GLUCOSE, CAPILLARY: Glucose-Capillary: 117 mg/dL — ABNORMAL HIGH (ref 70–99)

## 2010-04-24 LAB — PROTIME-INR
INR: 1.21 (ref 0.00–1.49)
Prothrombin Time: 15.5 seconds — ABNORMAL HIGH (ref 11.6–15.2)

## 2010-04-24 LAB — CBC
MCV: 84.7 fL (ref 78.0–100.0)
Platelets: 277 10*3/uL (ref 150–400)
RBC: 4.65 MIL/uL (ref 4.22–5.81)
WBC: 8.8 10*3/uL (ref 4.0–10.5)

## 2010-04-24 LAB — GLUCOSE, CAPILLARY
Glucose-Capillary: 127 mg/dL — ABNORMAL HIGH (ref 70–99)
Glucose-Capillary: 136 mg/dL — ABNORMAL HIGH (ref 70–99)

## 2010-04-24 LAB — HEPARIN LEVEL (UNFRACTIONATED): Heparin Unfractionated: <0.1 IU/mL — ABNORMAL LOW (ref 0.30–0.70)

## 2010-04-24 NOTE — Op Note (Signed)
NAMELEWIS, [name]             ACCOUNT NO.:  [id number]  MEDICAL RECORD NO.:  [id number]           PATIENT TYPE:  LOCATION:                                 FACILITY:  PHYSICIAN:  Thomas A. Cornett, M.D.DATE OF BIRTH:  November 27, 1940  DATE OF PROCEDURE:  04/21/2010 DATE OF DISCHARGE:                              OPERATIVE REPORT   PREOPERATIVE DIAGNOSIS:  Gallstone pancreatitis.  POSTOPERATIVE DIAGNOSIS:  Gallstone pancreatitis.  PROCEDURE:  Laparoscopic cholecystectomy with intraoperative cholangiogram.  SURGEON:  Thomas A. Cornett, MD  ANESTHESIA:  General endotracheal anesthesia, 0.25% Sensorcaine local.  ESTIMATED BLOOD LOSS:  Minimal.  SPECIMEN:  Gallbladder to Pathology.  DRAINS:  None.  INDICATIONS FOR PROCEDURE:  The patient is a 70 year old male admitted with nausea, vomiting, abdominal pain.  On workup, he was found to have sludge in his gallbladder and what looked like pancreatitis.  There was concern he had gallstone pancreatitis.  I was asked to see him and felt that he indeed probably suffered from gallstone pancreatitis and would benefit from cholecystectomy.  He has multiple medical problems including atrial fibrillation and flutter and some cardiac issues, but he was cleared from a cardiac standpoint.  We recommended going ahead and proceeding with laparoscopic cholecystectomy to prevent any further recurrences of pancreatitis.  Risks, benefits, alternative therapies, and potential complications were discussed with the patient.  Risk of bleeding, infection, common bile duct injury, injury to never by structures to include small bowel, large bowel, stomach, duodenum, pancreas, liver, abdominal wall, major blood vessels, common bile duct, bile leak, postop bleeding, as well as diaphragm.  He had previous abdominal surgery as well.  DESCRIPTION OF PROCEDURE:  The patient was brought to the operating room.  After induction of general anesthesia, the  abdomen was prepped and draped in sterile fashion.  Time-out was done.  A 1-cm infraumbilical incision was made.  Dissection was carried down to his fascia.  The fascia was opened in the midline.  A pursestring suture of 0 Vicryl was placed, and a 12-mm Hasson cannula was placed under direct vision.  Pneumoperitoneum was created to 15 mmHg of CO2, and a laparoscope was placed.  He was placed in reverse Trendelenburg and rolled to his left.  He had some midline adhesions about his right upper quadrant.  It was relatively adhesion free.  We then placed two 5-mm ports in the right upper quadrant and then used these ports to place the 11-mm subxiphoid trocar without difficulty.  Gallbladder was grabbed by its dome and retracted the patient's right shoulder.  A second grasper was used to grab the infundibulum of the gallbladder and pulled to the patient's right lower quadrant.  He had minimal inflammation of the gallbladder.  Critical angle was dissected out.  The cystic duct was dissected out circumferentially.  We then made a small incision and placed a Cook cholangiogram catheter in the cystic duct.  Intraoperative cholangiogram was done which showed free flow of contrast on a tortuous cystic duct into the normal-caliber common bile duct and duodenum. There was free flow of contrast at the common hepatic duct into the right and left lymphatic ducts.  I then removed the catheter.  I placed clips on both sides of the cystic duct and divided it.  We divided the cystic artery in a similar fashion between clips as well as a posterior branch between clips.  Cautery was used to dissect the gallbladder from the gallbladder fossa without difficulty.  Gallbladder was then placed in an Endocatch bag.  Cautery was used for hemostasis in the gallbladder bed.  There were no signs of bile leakage or bleeding.  Surgicel was placed in the gallbladder bed.  We then extracted the gallbladder to the umbilicus  and passed it off the field.  This fascial wound was closed with the preexisting suture of 0 Vicryl.  We reinspected the gallbladder bed and found no signs of bleeding or bile leakage.  We did a four- quadrant laparoscopy and saw no evidence of bowel injury or other injury intra-abdominally.  Excess irrigation was suctioned out.  We then removed our ports, and allowed the CO2 to escape with no signs of port site bleeding.  A 4-0 Monocryl was used to close the skin in subcuticular fashion.  Dermabond was applied.  All final counts of sponge, needle, and instruments were found to be correct at this portion of the case.  The patient was awoke, extubated, taken to recovery in satisfactory condition.     Thomas A. Cornett, M.D.     TAC/MEDQ  D:  04/21/2010  T:  04/22/2010  Job:  [id number]  Electronically Signed by Harriette Bouillon M.D. on 04/24/2010 12:35:55 AM

## 2010-04-25 LAB — GLUCOSE, CAPILLARY
Glucose-Capillary: 117 mg/dL — ABNORMAL HIGH (ref 70–99)
Glucose-Capillary: 122 mg/dL — ABNORMAL HIGH (ref 70–99)
Glucose-Capillary: 122 mg/dL — ABNORMAL HIGH (ref 70–99)

## 2010-04-25 LAB — PROTIME-INR: INR: 1.41 (ref 0.00–1.49)

## 2010-04-26 NOTE — Progress Notes (Signed)
NAMEKAESYN, JOHNSTON             ACCOUNT NO.:  [id number]  MEDICAL RECORD NO.:  [id number]          PATIENT TYPE:  INP  LOCATION:  3305                         FACILITY:  [facility]  PHYSICIAN:  Lonia Blood, M.D.DATE OF BIRTH:  1940/06/23                                PROGRESS NOTE   ADMITTING PHYSICIAN: Dr. Ruthy Dick, with [facility].  ATTENDING PHYSICIAN: Today is Dr. Jetty Duhamel, with [facility].  PRIMARY CARDIOLOGIST: Dr. Herbie Baltimore.  CONSULTANTS THIS ADMISSION: Dr. Harriette Bouillon with General Surgery, Dr. Lewayne Bunting, with Electrophysiology and Dr. Herbie Baltimore with Cardiology.  CHIEF COMPLAINT AND REASON FOR ADMISSION: Mr. Cundari is a 70 year old male patient with known history of diabetes, hypertension, atrial flutter on Coumadin, and COPD.  He also has nonobstructive coronary artery disease.  He underwent cardiac catheterization on January 17.  Immediately postop procedure, he began having severe abdominal pain, worse after he arrived home.  Initially, the patient of this was more or less related to a GI bug, but when the pain did not go away, eventually he sought care.  Upon initial evaluation in the ER, the patient had a temperature of 98.2, heart rate 111, BP 139/76, respirations 20, and O2 saturations 99% on room air.  His physical exam was unremarkable except for tenderness in the epigastric region.  On abdominal exam, the bowel sounds were present and were normoactive.  His initial white count was 13,300, hemoglobin 15.  INR 1.5.  Glucose 175, sodium 137, potassium 4.5, BUN 12, creatinine 1.05.  LFTs were normal.  Lipase was elevated at 561. Lactic acid was 1.5, troponin was 0.03.  Urinalysis showed a small amount of leukocyte esterase, but only 0-2 wbcs.  A CT scan of the abdomen and pelvis demonstrated mild inflammatory changes surrounding the body and tail of the pancreas consistent with mild pancreatitis and no complicating features  such abscess, bleeding, or pancreatic necrosis. There may be gallstones without the gallbladder.  PAST MEDICAL HISTORY: 1. Supraventricular tachycardia versus atrial flutter, on chronic     Coumadin anticoagulation. 2. Nonobstructive CAD.  Last cardiac catheterization April 12, 2010. 3. COPD, currently compensated. 4. History of diastolic dysfunction with preserved LV function. 5. Hypertension. 6. Type 2 diabetes, on metformin. 7. Dyslipidemia.  ADMITTING DIAGNOSES: 1. Acute pancreatitis, etiology unclear. 2. Dyslipidemia. 3. History of supraventricular tachycardia/atrial flutter. 4. Systemic anticoagulation, on Coumadin.  Currently subtherapeutic     INR. 5. Nonobstructive coronary artery disease, currently asymptomatic. 6. Chronic obstructive pulmonary disease, compensated. 7. Diastolic dysfunction, compensated. 8. Hypertension, controlled. 9. Diabetes type 2, on metformin.  DIAGNOSTICS: 1. CT of the abdomen and pelvis January 19 showed mild inflammatory     changes surrounding the body and tail of the pancreas, consist of     pancreatitis, without complicating features such as abscess or     pancreatic necrosis, may be some gallstones in the gallbladder. 2. Abdominal ultrasound on January 20, showed nonvisualization of the     pancreas, gallbladder neck with nonobstructing sludge,     visualization of prior nonobstructing lower pole, 9-mm right renal     calculus and 1-cm lower pole left renal portable cyst seen  on CT.     Next otherwise negative. 3. Nuclear medicine hepatobiliary scan on January 21 of common bile     duct and cystic duct patent without evidence of acute     cholecystitis.  However, dilated appearance of the gallbladder is     noted, which can be seen in chronic cholecystitis.  LABORATORY DATA: Lactic acid at presentation was 1.5.  LFTs were normal, presenting lipase was 561 with a peak of 1445 with last lipase checked January 24, being normal at 45.   MRSA PCR screening was negative.  Cardiac isoenzymes were cycled and showed no evidence of acute ischemia.  A BNP was checked on January 22, which was 254.  Repeat on January 23 was down to 220.  Current labs drawn on January 27, sodium 139, potassium 4.4, chloride 98, CO2 of 29, glucose 60, BUN 8, creatinine 0.96.  White count 10,700, hemoglobin 13, hematocrit 39.6, platelets 301.  PT 13.8 and INR 1.04.  PROBLEMS: 1. Atrial flutter with rapid ventricular response.  The patient     presented with mild rapid ventricular response with mild     tachycardia.  The patient later developed uncontrolled ventricular     rates, resting tachycardia up to 110-120, minimal exertion causing     tachycardia up to 160. This prompted a cardiology consult as well as     as an electrophysiology consultation.  The patient did require     IV Cardizem dosages as high as 12.5 mg per hour.  Later digoxin was     added as well as Lopressor and he was resumed on a half-dose of his     usual Cardizem.  As of April 21, 2010, the patient converted back     to sinus rhythm and has remained in sinus rhythm with occasional     sinus tachycardia.  No further atrial dysrhythmia.  Cardiology/     electrophysiology services plan radiofrequency ablation in several     weeks. 2. Acute biliary pancreatitis secondary to chronic cholecystitis.  The     patient presented with abdominal pain and mild pancreatitis and     elevated lipase greater than 500.  Lipase peaked at 1445 and with     bowel rest has trended down to normal.  Workup including CT of the     abdomen, abdominal ultrasound, and HIDA scan which pointed towards a     biliary etiology with chronic cholecystitis as the most likely     culprit.  Dr. Harriette Bouillon of surgery was consulted.  Once the     patient's INR was less than 2, the patient was deemed appropriate     for surgery and he underwent a laparoscopic cholecystectomy, on     April 21, 2010, per Dr.  Luisa Hart.  He has done well since surgery     and is tolerating diet. His wounds are unremarkable. 3. Systemic anticoagulation secondary to atrial flutter.  The     patient's Coumadin had been resumed early in the hospitalization     and INR was actually therapeutic, but Coumadin was placed on hold     for planned cholecystectomy.  As of today INR is subtherapeutic.  He     is on IV heparin and we will resume Coumadin today with pharmacy     dosing. 4. Constipation and subtle rectal bleeding.  Postoperatively, the     patient has complained of constipation and some mild rectal     bleeding with  passage of stool.  He does have a history of internal     hemorrhoids.  We will start him on Colace and MiraLax as well as     ProctoFoam.  Need to watch for bleeding in the setting of     anticoagulation. 5. Nonobstructive CAD.  The patient has remained asymptomatic.     Cardiac isoenzymes and EKG have remained stable in this admission. 6. Diabetes type 2.  The patient was on metformin prior to admission.     The CBG's have remained stable on sliding scale insulin, as of today     they range from 118-150.  Recommend resume metformin after     discharge. 7. COPD.  At the present time, the patient is compensated without     wheezing. 8. Diastolic heart failure.  Again this problem is compensated.  Since     he is tolerating a diet and no longer n.p.o. we will KVO with IV     fluids. 9. Hypertension.  At the present time, the patient is normotensive on     beta blocker and calcium channel blocker, although the calcium     channel blocker dosage is half of what he was taking prior to     admission.  Please note that he was also on an ACE inhibitor prior     to admission.  Lopressor was added this admission for rate     control.  He does have normal LV function, so it does not appear     that he will need an ACE inhibitor at discharge, unless his blood     pressure is not well controlled on the  other two agents. 10.Dyslipidemia.  At the present time, he is stable off medications.     Plan on resuming after discharge.  DISPOSITION: At the present time, the patient is appropriate for transfer to a telemetry unit.  Recommend continued telemetry monitoring because of recent issues with atrial flutter and rapid ventricular response.     Allison L. Rennis Harding, N.P.   ______________________________ Lonia Blood, M.D.    ALE/MEDQ  D:  04/22/2010  T:  04/22/2010  Job:  [id number]  cc:   Landry Corporal, MD Fax: [phone]  Clovis Pu. Cornett, M.D. [address] [place] [address]  Electronically Signed by Junious Silk N.P. on 04/22/2010 03:03:10 PM Electronically Signed by Jetty Duhamel M.D. on 04/26/2010 09:54:08 AM

## 2010-05-03 NOTE — Consult Note (Signed)
NAMEJASSON, [name]             ACCOUNT NO.:  [id number]  MEDICAL RECORD NO.:  [id number]           PATIENT TYPE:  LOCATION:                                 FACILITY:  PHYSICIAN:  Mariano Doshi A. Cheyla Duchemin, M.D.DATE OF BIRTH:  11-26-1940  DATE OF CONSULTATION:  04/18/2010 DATE OF DISCHARGE:                                CONSULTATION   REQUESTING PHYSICIAN:  Hollice Espy, MD  CONSULTING SURGEON:  Clovis Pu. Chatara Lucente, MD  REASON FOR CONSULTATION:  Biliary pancreatitis.  HISTORY OF PRESENT ILLNESS:  Mr. [name] is a pleasant 70 year old gentleman who has had ongoing workup over the past month for tachycardia and palpitations.  He recently underwent cardiac catheterization on April 12, 2010, and stated he started having some upper abdominal discomfort with radiation to his back approximately the same time.  They allowed him to go home, but the pain persisted.  He has had some nausea but no vomiting and has been able to tolerate limited diet.  He has had no diarrhea or constipation.  Denies any fever, chills, or sweats.  Pain got to the point where it was severe enough that he had to come back to the hospital for followup evaluation.  Upon his evaluation, he had a CT scan with some labs drawn which were significantly abnormal, his lipase greater than 1400, and his CT scan showing evidence of pancreatitis as well as some gallbladder sludge.  No acute abscess or pseudocyst or necrosis was noted at that time.  Since that time, the patient has both clinically improved, as well as his laboratory values improving, and it is felt that at this point a surgical consultation is warranted for possible cholecystectomy.  PAST MEDICAL HISTORY: 1. Supraventricular tachycardia, likely secondary to atrial flutter,     currently on Coumadin. 2. Coronary artery disease. 3. Hypertension. 4. Hyperlipidemia. 5. COPD. 6. Diabetes mellitus.  PAST SURGICAL HISTORY:  The patient has had left knee  surgery.  He also describes some a major foregut surgery in 1982 where he believes "approximately 30% of his upper intestine" was removed due to ulcer disease.  The exact procedure is unknown, and I doubt we will be able to obtain records from [place] from 1982.  SOCIAL HISTORY:  The patient does live at home.  He continues smoke about a pack of cigarettes a day since the age of 64, but denies any alcohol or illicit drug use.  FAMILY HISTORY:  The patient's mother passed away from breast cancer at age 74.  His father passed away at age 62 with significant heart disease.  ALLERGIES:  No known drug or latex allergies.  MEDICATIONS:  Cardizem, Protonix, Coumadin, aspirin, Flexeril, lisinopril, metformin, pravastatin, and Lopressor.  REVIEW OF SYSTEMS:  Please see history of present illness for pertinent findings.  Otherwise, complete 12 systems reviewed and negative.  PHYSICAL EXAMINATION:  GENERAL:  This is a 70 year old gentleman who is in no acute distress at present time. VITAL SIGNS:  Current vital signs show a temperature of 97.7, heart rate in the low 100s, blood pressure 97/39, respiratory rate of 16, oxygen saturation 98%. HEENT:  Unremarkable. NECK:  Supple  without lymphadenopathy.  Trachea is midline.  Thyroid without megaly or masses. LUNGS:  Clear to auscultation.  No wheezes, rhonchi, or rales.  Normal respiratory effort without use of accessory muscles. HEART:  Tachycardiac but sinus ranging anywhere from 100 to 160.  No murmurs, gallops, or rubs are noted.  Peripheral pulses diminished but symmetrical. ABDOMEN:  Soft, nondistended.  Large midline scar is noted from his surgery.  The patient is a mildly tender in the epigastrium, radiating to the right upper quadrant.  No mass effect, hernias, or abscesses are appreciated. RECTAL:  Deferred. MUSCULOSKELETAL:  The patient has normal extremities.  With regard to active range of motion, no crepitus or pain.  Normal  muscle strength and tone without atrophy. SKIN:  Otherwise, warm and dry with good turgor. NEUROLOGIC:  The patient is alert and oriented x3.  DIAGNOSTICS:  Most recent CBC is today showing a white blood cell count of 7.3, hemoglobin of 13.3, hematocrit of 38.6, platelet count of 242. Last liver function tests showed bilirubin 0.7, alkaline phosphatase of 65, AST of 18, ALT of 18.  Lipase on admission greater than 1400, currently 48. INR of 2.1.  The patient has been off Coumadin times several days.  CT scan upon admission showing mild inflammatory changes of the pancreas with subtle signs of gallstones or sludge in the gallbladder. Subsequent ultrasound again shows low-level gallbladder sludge in the gallbladder neck without evidence of acute cholecystitis. Follow up HIDA scan shows patency of both the cystic and common bile duct again proving no evidence of acute cholecystitis.  IMPRESSION: 1. Biliary pancreatitis without acute cholecystitis. 2. Ongoing management for supraventricular tachycardia. 3. Diabetes mellitus.  PLAN:  Review of the cardiologist notes show again ongoing workup, but it appears that ablation therapy could be deferred until after any surgical intervention is warranted.  Therefore, we believe the patient would benefit from laparoscopic versus open cholecystectomy this admission as more definitive treatment.  Therefore, we will continue to hold the patient's Coumadin, order therapeutic heparin drip, and plan for laparoscopic versus open cholecystectomy later this week.  Clear liquid diet would be okay or low- fat or nonfat and full liquid diet may be okay as well.  Dr. Luisa Hart has seen this patient and is in agreement with our plan.  Thank you for allowing [place] to participate in this patient's care.     Brayton El, PA-C   ______________________________ Clovis Pu Carlyon Nolasco, M.D.    Corky Downs  D:  04/18/2010  T:  04/19/2010  Job:  [id number]  cc:    Hollice Espy, M.D.  Electronically Signed by Brayton El  on 04/27/2010 10:50:39 AM Electronically Signed by Harriette Bouillon M.D. on 05/03/2010 06:47:39 PM

## 2010-05-03 NOTE — Op Note (Signed)
  [name], [name]             ACCOUNT NO.:  [id number]  MEDICAL RECORD NO.:  [id number]           PATIENT TYPE:  LOCATION:                                 FACILITY:  PHYSICIAN:  Telesforo Brosnahan A. Opal Mckellips, M.D.DATE OF BIRTH:  [birth year]  DATE OF PROCEDURE:  04/21/2010 DATE OF DISCHARGE:                              OPERATIVE REPORT   ADDENDUM.  FIRST ASSISTANT:  Dr. Andrey Campanile was the first assistant on this procedure.     [name], M.D.     TAC/MEDQ  D:  04/29/2010  T:  04/29/2010  Job:  [id number]  Electronically Signed by Harriette Bouillon M.D. on 05/03/2010 06:47:44 PM

## 2010-05-05 NOTE — Consult Note (Signed)
NAMEELDEAN, NANNA             ACCOUNT NO.:  [id number]  MEDICAL RECORD NO.:  [id number]          PATIENT TYPE:  INP  LOCATION:  3305                         FACILITY:  [facility]  PHYSICIAN:  Doylene Canning. Ladona Ridgel, MD    DATE OF BIRTH:  18-Nov-1940  DATE OF CONSULTATION:  04/18/2010 DATE OF DISCHARGE:                                CONSULTATION   CONSULTATION REQUESTED BY:  Landry Corporal, MD  INDICATION FOR CONSULTATION:  Recurrent episodes of atrial arrhythmias associated with palpitations.  HISTORY OF PRESENT ILLNESS:  Patient is a very pleasant 70 year old man who has now had 3 admissions to the hospital for his atrial arrhythmias and associated complications.  He was hospitalized on March 23, 2010, with atrial flutter and was treated with rate control.  His flutter symptoms were thought to paroxysmal.  He re-presented on April 08, 2010, with recurrent palpitations and documented atrial tachycardia versus fibrillation.  The patient did have chest pain and underwent catheterization during that admission which demonstrated no obstructive coronary disease.  The patient has not had frank syncope.  When he goes into atrial fibrillation with a rapid rate, he does get dyspneic.  He was recently discharged from the hospital after presenting with abdominal pain.  He was readmitted to the hospital after presenting with abdominal pain.  He had been discharged on April 12, 2010, and was readmitted on April 14, 2010.  He was subsequently again found to be in atrial flutter with a rapid ventricular response.  The patient subsequently has ruled out for MI.  Attempts to control his ventricular rate has been made somewhat difficult because he has fairly significant pancreatitis which is thought to be gallstones or gallbladder sludge related.  The patient however is improving with bowel rest.  He has been difficult to control from an arrhythmia perspective requiring additional doses of  digoxin and IV calcium channel blockers.  The patient has not had frank syncope.  He did have nausea and vomiting, this has resolved. There has been no diarrhea, but he has had constipation.  He denies any bright red blood per rectum or melena.  The patient's past medical history is notable for a history of a tachy palpitations with a history of nonobstructive coronary disease, history of COPD, history of diastolic heart failure, history of hypertension, history of diabetes, and history of dyslipidemia.  SOCIAL HISTORY:  The patient lives with his wife at home.  He smokes a pack of cigarettes a day.  He denies alcohol or drug use.  He has allergies to possibly TYLENOL.  REVIEW OF SYSTEMS:  All systems reviewed and negative except as noted in the HPI.  FAMILY HISTORY:  Notable for mother who died of complications of breast cancer at age 19, father died in the 67s after an MI.  He has a brother with coronary artery disease as well.  PHYSICAL EXAMINATION:  GENERAL:  He is a diskempt-appearing man in no acute distress.  He looks somewhat older than the stated age. VITAL SIGNS:  Blood pressure was 100/64,  the pulse was 110 and irregular, the respirations are 18, temperature is 98. HEENT:  Normocephalic  and atraumatic.  Pupils equal and round.  The oropharynx is moist.  His sclerae anicteric. NECK:  Revealed no jugular venous distention.  There is no thyromegaly. Trachea is midline.  Carotid are 2+ and symmetric.  LUNGS:  Clear bilaterally to auscultation except for rales in the bases bilaterally. There is trace wheeze.  No rhonchi.  No rales. CARDIOVASCULAR:  Revealed irregular rhythm with normal S1 and S2.  The PMI was not enlarged or laterally displaced.  ABDOMEN:  Soft, nontender. There is no organomegaly.  There is minimal tenderness to deep palpation in the mid epigastric region. EXTREMITIES:  Warm with no cyanosis, clubbing, or edema. NEUROLOGIC:  Nonfocal.  Alert and oriented  x3.  Cranial nerves intact. Strength is 5/5 and symmetric.  EKG demonstrates atrial flutter with a rapid ventricular response with a flutter cycle length of around 180 milliseconds.  IMPRESSION: 1. Recurrent/persistent atrial flutter. 2. Pancreatitis, likely secondary to cholecystitis. 3. Congestive heart failure class I-II.  DISCUSSION:  I have discussed the treatment options with the patient. He will be a candidate for catheter ablation of his atrial flutter hope as he has got a flutter in the past.  Unfortunately, he was not therapeutic with his Coumadin when he came into the hospital.  In addition, the patient would have to be required on anticoagulation for approximately 22 weeks prior to the ablation procedure and remained anticoagulant after the ablation procedure.  This represents a logistical problem and that the patient may also have to have surgery or percutaneous drainage tube for his gallbladder.  At present, however his pancreatitis appears to be on the mend and my recommendation would be to continue that treatment.  Following final disposition on the status of his gallbladder, we will make additional recommendations on catheter ablation for now, however, I would recommend continued digoxin and diltiazem as tolerable.  IV amiodarone could be tried for rate control. For now, I would rather not use amiodarone for fear of reverting him back to sinus rhythm.     Doylene Canning. Ladona Ridgel, MD     GWT/MEDQ  D:  04/18/2010  T:  04/19/2010  Job:  [id number]  Electronically Signed by Lewayne Bunting MD on 05/05/2010 05:18:22 PM

## 2010-05-11 NOTE — Discharge Summary (Signed)
NAMEELMO, RIO             ACCOUNT NO.:  [id number]  MEDICAL RECORD NO.:  [id number]          PATIENT TYPE:  INP  LOCATION:  2031                         FACILITY:  [facility]  PHYSICIAN:  Peggye Pitt, M.D. DATE OF BIRTH:  24-Apr-1940  DATE OF ADMISSION:  04/14/2010 DATE OF DISCHARGE:  04/26/2010                              DISCHARGE SUMMARY   CARDIOLOGIST:  Landry Corporal, MD  DISCHARGE DIAGNOSES: 1. Acute cholecystitis status post laparoscopic cholecystectomy on     April 22, 2010. 2. Acute gallstone pancreatitis, resolved. 3. Paroxysmal atrial fibrillation. 4. Postoperative colonic ileus. 5. Type 2 diabetes with hypoglycemic episodes while in the hospital. 6. Hyperlipidemia. 7. Hypertension with hyponatremia during this hospitalization. 8. History of diastolic congestive heart failure with preserved left     ventricular function. 9. History of nonobstructive coronary artery disease with most recent     catheterization on April 12, 2010. 10.History of supraventricular tachycardia versus aflutter on chronic     anticoagulation with Coumadin.  DISCHARGE MEDICATIONS: 1. Digoxin 0.125 mg daily. 2. Lovenox 85 mg subcutaneously twice daily until INR therapeutic. 3. ProctoCream one application rectally twice daily. 4. Metoprolol 50 mg daily. 5. Percocet 5/325 mg 1-2 tablets every 4 hours as needed for pain. 6. Aspirin 81 mg daily. 7. Loratadine 10 mg daily. 8. Pravastatin 20 mg daily. 9. Warfarin 5 mg daily.  DISPOSITION AND FOLLOWUP:  Mr. Lingelbach will be discharged from the hospital home today in stable and improved condition.  He will need to follow up with [facility] for the wound clinic in 1-2 weeks.  [facility] has provided them with their office number and card for him to call for this appointment.  Case manager will arrange for him to find a new primary care physician as well as home health RN visits to help him with the Lovenox and with INR  draws.  CONSULTATIONS THIS HOSPITALIZATION:  Dr. Luisa Hart with Surgery Dr. Ladona Ridgel with Electrophysiology.  HISTORY AND PHYSICAL:  For full details, see dictation by Dr. Abram Sander on April 14, 2010, but in brief, Mr. Khim is a very pleasant 70 year old gentleman who came in with a chief complaint of abdominal pain.  He had just been discharged on April 12, 2010, and returned 2 days later on the April 14, 2010, with progressive abdominal pain.  He was found to have an elevated lipase and diagnosis of pancreatitis was made.  HOSPITAL COURSE BY PROBLEM: 1. Pancreatitis.  This is presumed to be a biliary gallstone     pancreatitis.  The pancreatitis resolved with supportive treatment. 2. Acute cholecystitis.  Surgery was consulted.  They performed a     laparoscopic cholecystectomy on April 22, 2010.  The patient has     done well postoperative from this regard. 3. Colonic ileus.  The patient did develop an ileus status post     surgery, and this has now improved.  He has now had several bowel     movements. 4. He has been having supraventricular arrhythmias, question     supraventricular tachycardia versus aflutter.  He has been     evaluated by Dr. Ladona Ridgel with  Electrophysiology.  They have tweaked     his medications somewhat.  They are thinking about possible     catheter ablation in the future.  Mr. Craun has not had any     arrhythmias for the past 3 days. 5. Diabetes mellitus with hypoglycemic events while in the hospital.     We have for now discontinued his metformin.  This will need to be     followed up in the outpatient basis. 6. For his history of hypertension, he has had some hypotensive     episodes while in the hospital.  His blood pressure on day of     discharge is 107/63.  I have discontinued most of his blood     pressure medications leaving mainly the metoprolol, which will     assist with heart rate control given his history of arrhythmias.     This will  need to be further followed up in the outpatient setting. 7. Rest of chronic conditions have been stable.  VITAL SIGNS ON DAY OF DISCHARGE:  Blood pressure 112/68, heart rate 67, respirations 18, sats 95% on room air, and a temperature of 97.9.     Peggye Pitt, M.D.     EH/MEDQ  D:  04/26/2010  T:  04/27/2010  Job:  [id number]  cc:   Landry Corporal, MD Clovis Pu Cornett, M.D. Doylene Canning. Ladona Ridgel, MD  Electronically Signed by Peggye Pitt M.D. on 05/11/2010 08:01:54 AM

## 2010-05-25 ENCOUNTER — Institutional Professional Consult (permissible substitution): Payer: Self-pay | Admitting: Internal Medicine

## 2010-06-02 ENCOUNTER — Institutional Professional Consult (permissible substitution): Payer: Self-pay | Admitting: Internal Medicine

## 2010-06-06 LAB — CBC
Hemoglobin: 16.1 g/dL (ref 13.0–17.0)
MCH: 29.4 pg (ref 26.0–34.0)
MCHC: 34.5 g/dL (ref 30.0–36.0)
MCV: 85.4 fL (ref 78.0–100.0)
MCV: 86.3 fL (ref 78.0–100.0)
Platelets: 181 10*3/uL (ref 150–400)
RBC: 5.1 MIL/uL (ref 4.22–5.81)
RBC: 5.47 MIL/uL (ref 4.22–5.81)
RDW: 13.7 % (ref 11.5–15.5)
WBC: 6.4 10*3/uL (ref 4.0–10.5)

## 2010-06-06 LAB — COMPREHENSIVE METABOLIC PANEL
AST: 18 U/L (ref 0–37)
Albumin: 3.3 g/dL — ABNORMAL LOW (ref 3.5–5.2)
Alkaline Phosphatase: 61 U/L (ref 39–117)
BUN: 16 mg/dL (ref 6–23)
GFR calc Af Amer: >60 mL/min (ref >60)
Potassium: 4.4 mEq/L (ref 3.5–5.1)
Total Protein: 6.1 g/dL (ref 6.0–8.3)

## 2010-06-06 LAB — GLUCOSE, CAPILLARY
Glucose-Capillary: 112 mg/dL — ABNORMAL HIGH (ref 70–99)
Glucose-Capillary: 117 mg/dL — ABNORMAL HIGH (ref 70–99)
Glucose-Capillary: 123 mg/dL — ABNORMAL HIGH (ref 70–99)
Glucose-Capillary: 130 mg/dL — ABNORMAL HIGH (ref 70–99)
Glucose-Capillary: 140 mg/dL — ABNORMAL HIGH (ref 70–99)

## 2010-06-06 LAB — APTT: aPTT: 30 seconds (ref 24–37)

## 2010-06-06 LAB — DIFFERENTIAL
Basophils Relative: 1 % (ref 0–1)
Eosinophils Absolute: 0.2 10*3/uL (ref 0.0–0.7)
Eosinophils Relative: 2 % (ref 0–5)
Lymphs Abs: 2.7 10*3/uL (ref 0.7–4.0)
Monocytes Relative: 7 % (ref 3–12)
Neutrophils Relative %: 63 % (ref 43–77)

## 2010-06-06 LAB — HEMOGLOBIN A1C
Hgb A1c MFr Bld: 6.9 % — ABNORMAL HIGH (ref <5.7)
Mean Plasma Glucose: 151 mg/dL — ABNORMAL HIGH (ref <117)

## 2010-06-06 LAB — DRUGS OF ABUSE SCREEN W/O ALC, ROUTINE URINE
Amphetamine Screen, Ur: NEGATIVE
Barbiturate Quant, Ur: NEGATIVE
Cocaine Metabolites: NEGATIVE
Creatinine,U: 56.8 mg/dL
Marijuana Metabolite: NEGATIVE
Methadone: NEGATIVE

## 2010-06-06 LAB — BASIC METABOLIC PANEL
CO2: 22 mEq/L (ref 19–32)
Calcium: 9.1 mg/dL (ref 8.4–10.5)
Chloride: 104 mEq/L (ref 96–112)
Creatinine, Ser: 1.03 mg/dL (ref 0.4–1.5)
Glucose, Bld: 164 mg/dL — ABNORMAL HIGH (ref 70–99)

## 2010-06-06 LAB — CARDIAC PANEL(CRET KIN+CKTOT+MB+TROPI)
CK, MB: 2.1 ng/mL (ref 0.3–4.0)
Relative Index: 1.9 (ref 0.0–2.5)
Relative Index: 1.9 (ref 0.0–2.5)
Total CK: 108 U/L (ref 7–232)
Troponin I: 0.01 ng/mL (ref 0.00–0.06)

## 2010-06-06 LAB — CK TOTAL AND CKMB (NOT AT ARMC): CK, MB: 2.6 ng/mL (ref 0.3–4.0)

## 2010-06-06 LAB — LIPID PANEL
LDL Cholesterol: 92 mg/dL (ref 0–99)
Total CHOL/HDL Ratio: 5 RATIO
Triglycerides: 160 mg/dL — ABNORMAL HIGH (ref <150)
VLDL: 32 mg/dL (ref 0–40)

## 2010-06-06 LAB — PROTIME-INR
INR: 0.95 (ref 0.00–1.49)
Prothrombin Time: 12.9 seconds (ref 11.6–15.2)

## 2010-06-06 LAB — D-DIMER, QUANTITATIVE: D-Dimer, Quant: 0.27 ug/mL-FEU (ref 0.00–0.48)

## 2010-07-27 ENCOUNTER — Encounter: Payer: Self-pay | Admitting: Internal Medicine

## 2010-07-29 ENCOUNTER — Ambulatory Visit (INDEPENDENT_AMBULATORY_CARE_PROVIDER_SITE_OTHER): Payer: Medicare Other | Admitting: Internal Medicine

## 2010-07-29 ENCOUNTER — Encounter: Payer: Self-pay | Admitting: *Deleted

## 2010-07-29 ENCOUNTER — Encounter: Payer: Self-pay | Admitting: Internal Medicine

## 2010-07-29 DIAGNOSIS — I10 Essential (primary) hypertension: Secondary | ICD-10-CM

## 2010-07-29 DIAGNOSIS — I4891 Unspecified atrial fibrillation: Secondary | ICD-10-CM

## 2010-07-29 DIAGNOSIS — I4892 Unspecified atrial flutter: Secondary | ICD-10-CM

## 2010-07-29 DIAGNOSIS — I509 Heart failure, unspecified: Secondary | ICD-10-CM

## 2010-07-29 DIAGNOSIS — I5032 Chronic diastolic (congestive) heart failure: Secondary | ICD-10-CM | POA: Insufficient documentation

## 2010-07-29 DIAGNOSIS — I2510 Atherosclerotic heart disease of native coronary artery without angina pectoris: Secondary | ICD-10-CM

## 2010-07-29 LAB — CBC WITH DIFFERENTIAL/PLATELET
Basophils Absolute: 0 10*3/uL (ref 0.0–0.1)
Basophils Relative: 0.4 % (ref 0.0–3.0)
Eosinophils Absolute: 0.2 10*3/uL (ref 0.0–0.7)
Eosinophils Relative: 3 % (ref 0.0–5.0)
HCT: 40.7 % (ref 39.0–52.0)
Hemoglobin: 13.6 g/dL (ref 13.0–17.0)
Lymphocytes Relative: 32.8 % (ref 12.0–46.0)
Lymphs Abs: 2.2 10*3/uL (ref 0.7–4.0)
MCHC: 33.5 g/dL (ref 30.0–36.0)
MCV: 80.6 fl (ref 78.0–100.0)
Monocytes Absolute: 0.6 10*3/uL (ref 0.1–1.0)
Monocytes Relative: 9.1 % (ref 3.0–12.0)
Neutro Abs: 3.7 10*3/uL (ref 1.4–7.7)
Neutrophils Relative %: 54.7 % (ref 43.0–77.0)
Platelets: 235 10*3/uL (ref 150.0–400.0)
RBC: 5.05 Mil/uL (ref 4.22–5.81)
RDW: 15.4 % — ABNORMAL HIGH (ref 11.5–14.6)
WBC: 6.8 10*3/uL (ref 4.5–10.5)

## 2010-07-29 LAB — BASIC METABOLIC PANEL
BUN: 13 mg/dL (ref 6–23)
CO2: 27 mEq/L (ref 19–32)
Calcium: 9.1 mg/dL (ref 8.4–10.5)
Chloride: 101 mEq/L (ref 96–112)
Creatinine, Ser: 0.9 mg/dL (ref 0.4–1.5)
GFR: 84.02 mL/min (ref >60.00)
Glucose, Bld: 141 mg/dL — ABNORMAL HIGH (ref 70–99)
Potassium: 4.4 mEq/L (ref 3.5–5.1)
Sodium: 136 mEq/L (ref 135–145)

## 2010-07-29 LAB — PROTIME-INR
INR: 2.5 ratio — ABNORMAL HIGH (ref 0.8–1.0)
Prothrombin Time: 25.6 s — ABNORMAL HIGH (ref 10.2–12.4)

## 2010-07-29 NOTE — Assessment & Plan Note (Signed)
Stable No change required today  

## 2010-07-29 NOTE — Assessment & Plan Note (Signed)
The patient has a h/o recurrent symptomatic typical appearing atrial flutter.  I have reviewed his prior EKGs and do not see any documentation of atrial fibrillation.  I agree with Dr Ladona Ridgel and Dr Herbie Baltimore that catheter ablation for atrial flutter is a reasonable option to reduce risks for stroke and improve symptoms. Therapeutic strategies for atrial flutter including medicine and ablation were discussed in detail with the patient today. Risk, benefits, and alternatives to EP study and radiofrequency ablation were also discussed in detail today. These risks include but are not limited to stroke, bleeding, vascular damage, tamponade, perforation, damage to the heart and other structures, AV block requiring pacemaker, worsening renal function, and death. The patient understands these risk and wishes to proceed.  We will therefore proceed with catheter ablation once the patient has been adequately anticoagulated.

## 2010-07-29 NOTE — Patient Instructions (Signed)
Your physician has recommended that you have an ablation. Catheter ablation is a medical procedure used to treat some cardiac arrhythmias (irregular heartbeats). During catheter ablation, a long, thin, flexible tube is put into a blood vessel in your groin (upper thigh), or neck. This tube is called an ablation catheter. It is then guided to your heart through the blood vessel. Radio frequency waves destroy small areas of heart tissue where abnormal heartbeats may cause an arrhythmia to start. Please see the instruction sheet given to you today.  Your physician recommends that you return for lab work today bmp/cbc/inr  [phone]

## 2010-07-29 NOTE — Progress Notes (Signed)
[name] is a pleasant 70 y.o. yo patient with a h/o recurrent symptomatic typical appearing atrial flutter who presents today for EP follow-up.  He was evaluated previously by Dr Ladona Ridgel during a hospitalization for acute cholecystitis and pancreatitis 1/12.  He was observed to have typical appearing atrial flutter at that time.  He also underwent cath 1/12 which revealed nonobstructive CAD.   Dr Ladona Ridgel felt that in the setting of acute cholecystitis, that catheter ablation should be deferred.  The patient therefore presents today for further follow-up. He reports doing well since his hospital discharge.  He has occasional palpiations. Today, he denies symptoms of  chest pain, shortness of breath, orthopnea, PND, lower extremity edema, dizziness, presyncope, syncope, or neurologic sequela. The patient is tolerating medications without difficulties and is otherwise without complaint today.   Past Medical History  Diagnosis Date  . Cholecystitis 04/22/2010    acute s/p cholecystectomy  . Gallstone pancreatitis 04/22/10  . Atrial flutter     typical by ekg  . Diabetes mellitus type II   . Hyperlipidemia   . HTN (hypertension)   . Diastolic CHF, chronic     preserved EF   Past Surgical History  Procedure Date  . Cholecystectomy 1/12    Current Outpatient Prescriptions  Medication Sig Dispense Refill  . aspirin 81 MG tablet Take 81 mg by mouth daily.        . digoxin (LANOXIN) 0.125 MG tablet Take 125 mcg by mouth daily.        . Hydrocortisone Ace-Pramoxine (PROCTOCREAM-HC RE) Place rectally. Prn        . loratadine (CLARITIN) 10 MG tablet Take 10 mg by mouth daily.        . metformin (FORTAMET) 500 MG (OSM) 24 hr tablet Take 500 mg by mouth 3 (three) times daily.        . metoprolol (TOPROL-XL) 50 MG 24 hr tablet Take 100 mg by mouth daily.        . nitroGLYCERIN (NITROSTAT) 0.4 MG SL tablet Place 0.4 mg under the tongue every 5 (five) minutes as needed.        . pravastatin  (PRAVACHOL) 20 MG tablet Take 20 mg by mouth daily.        [name] warfarin (COUMADIN) 5 MG tablet Take 5 mg by mouth daily.        [name] DISCONTD: metoprolol (LOPRESSOR) 50 MG tablet Take 50 mg by mouth daily.        [name] DISCONTD: oxyCODONE-acetaminophen (PERCOCET) 5-325 MG per tablet Take by mouth every 4 (four) hours as needed. 1-2 tablets every 4 hours as needed for pain         No Known Allergies  History   Social History  . Marital Status: Widowed    Spouse Name: [name]    Number of Children: [name]  . Years of Education: [name]   Occupational History  . Not on file.   Social History Main Topics  . Smoking status: Current Everyday Smoker  . Smokeless tobacco: Not on file  . Alcohol Use: No  . Drug Use: No  . Sexually Active: Not on file   Other Topics Concern  . Not on file   Social History Narrative    The patient lives with his wife at home    Family History  Problem Relation Age of Onset  . Cancer Mother   . Heart attack Father     ROS- All systems are reviewed and negative except as per  the HPI above  Physical Exam: Filed Vitals:   07/29/10 1003  BP: 120/80  Pulse: 96  Height: 5\' 6"  (1.676 m)  Weight: 214 lb (97.07 kg)    GEN- The patient is overweight, alert and oriented x 3 today.   Head- normocephalic, atraumatic Eyes-  Sclera clear, conjunctiva pink Ears- hearing intact Oropharynx- clear Neck- supple, no JVP Lymph- no cervical lymphadenopathy Lungs- Clear to ausculation bilaterally, normal work of breathing Heart- Regular rate and rhythm, no murmurs, rubs or gallops, PMI not laterally displaced GI- soft, NT, ND, + BS Extremities- no clubbing, cyanosis, or edema MS- no significant deformity or atrophy Skin- no rash or lesion Psych- euthymic mood, full affect Neuro- strength and sensation are intact  EKG reviewed from 1/12 reveals typical appearing atrial flutter.  Assessment and Plan:

## 2010-08-05 ENCOUNTER — Ambulatory Visit (HOSPITAL_COMMUNITY)
Admission: RE | Admit: 2010-08-05 | Discharge: 2010-08-05 | Disposition: A | Discharge status: Home / Self Care | Payer: Medicare Other | Source: Ambulatory Visit | Attending: Internal Medicine | Admitting: Internal Medicine

## 2010-08-05 DIAGNOSIS — I10 Essential (primary) hypertension: Secondary | ICD-10-CM | POA: Insufficient documentation

## 2010-08-05 DIAGNOSIS — E119 Type 2 diabetes mellitus without complications: Secondary | ICD-10-CM | POA: Insufficient documentation

## 2010-08-05 DIAGNOSIS — I4892 Unspecified atrial flutter: Secondary | ICD-10-CM

## 2010-08-05 DIAGNOSIS — Z01810 Encounter for preprocedural cardiovascular examination: Secondary | ICD-10-CM | POA: Insufficient documentation

## 2010-08-05 DIAGNOSIS — I519 Heart disease, unspecified: Secondary | ICD-10-CM | POA: Insufficient documentation

## 2010-08-05 DIAGNOSIS — Z01812 Encounter for preprocedural laboratory examination: Secondary | ICD-10-CM | POA: Insufficient documentation

## 2010-08-05 HISTORY — PX: ATRIAL ABLATION SURGERY: SHX560

## 2010-08-05 LAB — CBC
MCH: 25.6 pg — ABNORMAL LOW (ref 26.0–34.0)
MCV: 81.5 fL (ref 78.0–100.0)
Platelets: 247 10*3/uL (ref 150–400)
RDW: 14.6 % (ref 11.5–15.5)
WBC: 7.2 10*3/uL (ref 4.0–10.5)

## 2010-08-05 LAB — GLUCOSE, CAPILLARY: Glucose-Capillary: 249 mg/dL — ABNORMAL HIGH (ref 70–99)

## 2010-08-05 LAB — PROTIME-INR: Prothrombin Time: 22.4 seconds — ABNORMAL HIGH (ref 11.6–15.2)

## 2010-08-17 ENCOUNTER — Inpatient Hospital Stay (HOSPITAL_COMMUNITY)
Admission: EM | Admit: 2010-08-17 | Discharge: 2010-08-19 | DRG: 309 | Disposition: A | Discharge status: Home / Self Care | Payer: Medicare Other | Attending: Cardiology | Admitting: Cardiology

## 2010-08-17 ENCOUNTER — Emergency Department (HOSPITAL_COMMUNITY): Payer: Medicare Other

## 2010-08-17 DIAGNOSIS — E119 Type 2 diabetes mellitus without complications: Secondary | ICD-10-CM | POA: Diagnosis present

## 2010-08-17 DIAGNOSIS — I4891 Unspecified atrial fibrillation: Principal | ICD-10-CM | POA: Diagnosis present

## 2010-08-17 DIAGNOSIS — F17200 Nicotine dependence, unspecified, uncomplicated: Secondary | ICD-10-CM | POA: Diagnosis present

## 2010-08-17 DIAGNOSIS — I10 Essential (primary) hypertension: Secondary | ICD-10-CM | POA: Diagnosis present

## 2010-08-17 DIAGNOSIS — I4892 Unspecified atrial flutter: Secondary | ICD-10-CM | POA: Diagnosis present

## 2010-08-17 DIAGNOSIS — E669 Obesity, unspecified: Secondary | ICD-10-CM | POA: Diagnosis present

## 2010-08-17 DIAGNOSIS — G4733 Obstructive sleep apnea (adult) (pediatric): Secondary | ICD-10-CM | POA: Diagnosis present

## 2010-08-17 DIAGNOSIS — J441 Chronic obstructive pulmonary disease with (acute) exacerbation: Secondary | ICD-10-CM | POA: Diagnosis present

## 2010-08-17 LAB — CK TOTAL AND CKMB (NOT AT ARMC)
CK, MB: 2 ng/mL (ref 0.3–4.0)
Total CK: 124 U/L (ref 7–232)

## 2010-08-17 LAB — APTT: aPTT: 29 seconds (ref 24–37)

## 2010-08-17 LAB — CBC
MCH: 24.8 pg — ABNORMAL LOW (ref 26.0–34.0)
MCHC: 31.3 g/dL (ref 30.0–36.0)
Platelets: 214 10*3/uL (ref 150–400)

## 2010-08-17 LAB — DIGOXIN LEVEL
Digoxin Level: 0.5 ng/mL — ABNORMAL LOW (ref 0.8–2.0)
Digoxin Level: <0.3 ng/mL — ABNORMAL LOW (ref 0.8–2.0)

## 2010-08-17 LAB — TROPONIN I: Troponin I: <0.3 ng/mL (ref <0.30)

## 2010-08-17 LAB — PHOSPHORUS: Phosphorus: 2.6 mg/dL (ref 2.3–4.6)

## 2010-08-17 LAB — POCT I-STAT, CHEM 8
BUN: 15 mg/dL (ref 6–23)
Calcium, Ion: 1.01 mmol/L — ABNORMAL LOW (ref 1.12–1.32)
Creatinine, Ser: 1.1 mg/dL (ref 0.4–1.5)
Glucose, Bld: 197 mg/dL — ABNORMAL HIGH (ref 70–99)
TCO2: 19 mmol/L (ref 0–100)

## 2010-08-17 LAB — COMPREHENSIVE METABOLIC PANEL
Albumin: 3.1 g/dL — ABNORMAL LOW (ref 3.5–5.2)
Alkaline Phosphatase: 84 U/L (ref 39–117)
BUN: 15 mg/dL (ref 6–23)
Calcium: 8.6 mg/dL (ref 8.4–10.5)
Potassium: 4.6 mEq/L (ref 3.5–5.1)
Sodium: 134 mEq/L — ABNORMAL LOW (ref 135–145)
Total Protein: 6.6 g/dL (ref 6.0–8.3)

## 2010-08-17 LAB — DIFFERENTIAL
Basophils Relative: 0 % (ref 0–1)
Eosinophils Absolute: 0.1 10*3/uL (ref 0.0–0.7)
Monocytes Absolute: 0.5 10*3/uL (ref 0.1–1.0)
Monocytes Relative: 6 % (ref 3–12)

## 2010-08-17 LAB — HEPATIC FUNCTION PANEL
ALT: 70 U/L — ABNORMAL HIGH (ref 0–53)
AST: 69 U/L — ABNORMAL HIGH (ref 0–37)
Indirect Bilirubin: 0.1 mg/dL — ABNORMAL LOW (ref 0.3–0.9)
Total Protein: 7 g/dL (ref 6.0–8.3)

## 2010-08-17 LAB — GLUCOSE, CAPILLARY: Glucose-Capillary: 162 mg/dL — ABNORMAL HIGH (ref 70–99)

## 2010-08-17 LAB — POCT CARDIAC MARKERS

## 2010-08-17 LAB — PROTIME-INR: Prothrombin Time: 13.1 seconds (ref 11.6–15.2)

## 2010-08-18 LAB — CK TOTAL AND CKMB (NOT AT ARMC)
CK, MB: 1.8 ng/mL (ref 0.3–4.0)
CK, MB: 2 ng/mL (ref 0.3–4.0)
Relative Index: 1.3 (ref 0.0–2.5)
Relative Index: 1.3 (ref 0.0–2.5)
Total CK: 157 U/L (ref 7–232)

## 2010-08-18 LAB — BASIC METABOLIC PANEL
Calcium: 8.1 mg/dL — ABNORMAL LOW (ref 8.4–10.5)
GFR calc Af Amer: >60 mL/min (ref >60)
GFR calc non Af Amer: >60 mL/min (ref >60)
Glucose, Bld: 180 mg/dL — ABNORMAL HIGH (ref 70–99)
Potassium: 4 mEq/L (ref 3.5–5.1)
Sodium: 135 mEq/L (ref 135–145)

## 2010-08-18 LAB — GLUCOSE, CAPILLARY
Glucose-Capillary: 187 mg/dL — ABNORMAL HIGH (ref 70–99)
Glucose-Capillary: 165 mg/dL — ABNORMAL HIGH (ref 70–99)
Glucose-Capillary: 186 mg/dL — ABNORMAL HIGH (ref 70–99)

## 2010-08-18 LAB — HEPARIN LEVEL (UNFRACTIONATED)
Heparin Unfractionated: 0.34 IU/mL (ref 0.30–0.70)
Heparin Unfractionated: 0.13 IU/mL — ABNORMAL LOW (ref 0.30–0.70)

## 2010-08-18 LAB — CBC
HCT: 39.3 % (ref 39.0–52.0)
Hemoglobin: 12.8 g/dL — ABNORMAL LOW (ref 13.0–17.0)
MCH: 25.7 pg — ABNORMAL LOW (ref 26.0–34.0)
MCV: 78.9 fL (ref 78.0–100.0)
Platelets: 180 10*3/uL (ref 150–400)
RBC: 4.98 MIL/uL (ref 4.22–5.81)
WBC: 5.8 10*3/uL (ref 4.0–10.5)

## 2010-08-18 LAB — TSH: TSH: 0.988 u[IU]/mL (ref 0.350–4.500)

## 2010-08-18 LAB — TROPONIN I: Troponin I: <0.3 ng/mL (ref <0.30)

## 2010-08-19 LAB — DIFFERENTIAL
Eosinophils Relative: 5 % (ref 0–5)
Lymphocytes Relative: 40 % (ref 12–46)
Lymphs Abs: 2.1 10*3/uL (ref 0.7–4.0)
Monocytes Relative: 14 % — ABNORMAL HIGH (ref 3–12)

## 2010-08-19 LAB — GLUCOSE, CAPILLARY: Glucose-Capillary: 151 mg/dL — ABNORMAL HIGH (ref 70–99)

## 2010-08-19 LAB — CBC
MCHC: 32 g/dL (ref 30.0–36.0)
MCV: 79.4 fL (ref 78.0–100.0)
Platelets: 191 10*3/uL (ref 150–400)
RDW: 15.4 % (ref 11.5–15.5)
WBC: 5.2 10*3/uL (ref 4.0–10.5)

## 2010-08-19 LAB — PROTIME-INR: INR: 1.06 (ref 0.00–1.49)

## 2010-08-19 NOTE — Discharge Summary (Signed)
  [name], KELTNER             ACCOUNT NO.:  [id number]  MEDICAL RECORD NO.:  [id number]           PATIENT TYPE:  I  LOCATION:  2032                         FACILITY:  [facility]  PHYSICIAN:  Hillis Range, MD       DATE OF BIRTH:  February 07, 1941  DATE OF ADMISSION:  08/05/2010 DATE OF DISCHARGE:  08/05/2010                              DISCHARGE SUMMARY   ADMISSION DIAGNOSIS:  Atrial flutter.  SECONDARY DIAGNOSES: 1. Diabetes. 2. Hypertension. 3. Diastolic dysfunction.  PROCEDURES:  Electrophysiology study and radiofrequency ablation for atrial flutter.  CONSULTATIONS:  None.  HISTORY OF PRESENT ILLNESS:  Mr. [name] is a very pleasant 70 year old gentleman with a history of recurrent symptomatic typical-appearing atrial flutter who presented for EP study and radiofrequency ablation. He has had multiple episodes of atrial flutter despite medical therapy with digoxin and metoprolol.  He has been appropriately anticoagulated with Coumadin.  He now presents for EP study and radiofrequency ablation.  HOSPITAL COURSE:  Informed written consent was obtained and the patient was brought to the Electrophysiology Lab.  He was found to be in sinus rhythm upon presentation.  He had no evidence of accessory pathways or dual AV nodal physiology.  No arrhythmias could be induced today.  As he has a history of known typical atrial flutter, he underwent empiric cavotricuspid isthmus ablation and complete cavotricuspid isthmus block was achieved bidirectionally.  He did well following ablation and was observed without postprocedure complications.  He remained in sinus rhythm thereafter.  At the time of discharge, the patient was alert, ambulatory, and otherwise without complaint.  DISPOSITION:  Home.  DISCHARGE CONDITION:  Good.  DISCHARGE DIET:  Cardiac prudent.  DISCHARGE INSTRUCTIONS:  Increase activities as tolerated.  DISCHARGE MEDICATIONS:  Coumadin is discontinued.  The patient  will continue his other home medications as follows: 1. Aspirin 81 mg daily. 2. Loratadine 10 mg daily. 3. Metformin 500 mg t.i.d. 4. Digoxin 0.125 mg daily. 5. Toprol-XL 100 mg daily. 6. Pravastatin 20 mg daily. 7. Hydrocortisone/pramoxine rectal foam twice daily.  FOLLOWUP:  The patient will return for followup in my office in 4 weeks. He will continue to follow with Dr. Herbie Baltimore as scheduled.     Hillis Range, MD     JA/MEDQ  D:  08/05/2010  T:  08/06/2010  Job:  [id number]  cc:   Landry Corporal, MD  Electronically Signed by Hillis Range MD on 08/19/2010 05:38:06 PM

## 2010-08-19 NOTE — Op Note (Signed)
NAMEHAWK, [name]             ACCOUNT NO.:  [id number]  MEDICAL RECORD NO.:  [id number]           PATIENT TYPE:  I  LOCATION:  2032                         FACILITY:  [facility]  PHYSICIAN:  Trevor Range, MD       DATE OF BIRTH:  12-12-1940  DATE OF PROCEDURE: DATE OF DISCHARGE:  08/05/2010                              OPERATIVE REPORT   SURGEON:  Trevor Range, MD  PREPROCEDURE DIAGNOSIS:  Typical appearing atrial flutter.  POSTPROCEDURE DIAGNOSIS:  Typical appearing atrial flutter.  PROCEDURE: 1. Comprehensive electrophysiology study. 2. Coronary sinus pacing and recording. 3. Mapping of supraventricular tachycardia. 4. Radiofrequency ablation of supraventricular tachycardia.  INTRODUCTION:  [name] is a pleasant 70 year old gentleman with a history of recurrent symptomatic typical appearing atrial flutter who presents today for EP study and radiofrequency ablation.  He has had multiple episodes of typical appearing atrial flutter despite medical therapy with digoxin and metoprolol.  He has been appropriately anticoagulated with Coumadin.  He therefore presents today for EP study and radiofrequency ablation.  DESCRIPTION OF PROCEDURE:  Informed written consent was obtained and the patient was brought to the Electrophysiology Lab in the fasting state. He was adequately sedated with intravenous Versed and fentanyl as outlined in the nursing report.  The patient's right groin was prepped and draped in the usual sterile fashion by the EP lab staff.  Using a percutaneous Seldinger technique, two 6-French and one 8-French hemostasis sheaths were placed in the right common femoral vein.  A 6- [place] decapolar Pleurx x  catheter was introduced through the right common femoral vein and advanced into the coronary sinus for recording and pacing from this location.  A 6-French quadripolar Josephson catheter was introduced through the right common femoral vein and advanced into the  right ventricle for recording and pacing.  This catheter was then pulled back to the His bundle location.  The patient presented to the Electrophysiology Lab in normal sinus rhythm.  His PR interval was 206 milliseconds with a QRS duration of 71 milliseconds and a QT interval 343 milliseconds.  His AH interval measured 125 milliseconds with an HV interval of 53 milliseconds.  Ventricular pacing was performed which revealed VA dissociation when pacing at 600 milliseconds.  Rapid atrial pacing was performed which revealed decremental AV conduction with no evidence of PR greater than RR and no tachycardias induced when pacing down to a cycle length of 200 milliseconds.  Multiple attempts at atrial pacing were made in order to induce atrial flutter, however, atrial flutter could not be induced today.  As the patient has had classic appearing atrial flutter in the past I elected to perform cavotricuspid isthmus ablation today.  A 7- [place] [employer] II XP 8-mm ablation catheter was therefore introduced through the right common femoral vein and advanced into the right atrium.  Mapping was performed along the cavotricuspid isthmus which revealed a standard isthmus.  A series of radiofrequency applications were therefore delivered between the tricuspid valve annulus and the inferior vena cava along the usual cavotricuspid isthmus.  Lesions were delivered at 50 watts with a target temperature of 60 degrees for  120 seconds each.  A total of 5 radiofrequency applications were required in order to produce complete cavotricuspid isthmus block as evident by differential atrial pacing.  A 7-French dual decapolar circular halo catheter was introduced through the right common femoral vein and positioned around the tricuspid valve annulus. Differential atrial pacing from the low lateral right atrium confirmed complete bidirectional cavotricuspid isthmus block.  The patient was observed for  20 minutes without return of conduction through the isthmus.  Atrial extra stimulus testing was then performed which revealed decremental AV conduction with no AH jumps, echo beats or tachycardias observed.  The atrial ERP was 500/240 milliseconds. Additional rapid atrial pacing was performed which revealed an AV Wenckebach cycle length of 350 milliseconds.  The patient had nonsustained atrial tachycardia which was felt to likely be due to positioning of the halo catheter in the right atrium.  No sustained arrhythmias were observed.  Following ablation, the AH interval measured 107 milliseconds with an HV interval of 56 milliseconds.  The procedure was therefore considered completed.  All catheters were removed and sheaths were aspirated and flushed.  There were no early apparent complications.  CONCLUSIONS: 1. Sinus rhythm upon presentation. 2. No evidence of dual AV nodal physiology or accessory pathways. 3. No inducible supraventricular tachycardia, atrial flutter, or     atrial fibrillation today. 4. Due to the patient's prior presentation with classic atrial     flutter, empiric cavotricuspid isthmus ablation was performed today     with complete bidirectional cavotricuspid isthmus block achieved. 5. No inducible sustained arrhythmias following ablation. 6. No early apparent complications.     Trevor Range, MD     JA/MEDQ  D:  08/05/2010  T:  08/06/2010  Job:  [id number]  cc:   Landry Corporal, MD  Electronically Signed by Trevor Range MD on 08/19/2010 05:38:08 PM

## 2010-08-29 ENCOUNTER — Emergency Department (HOSPITAL_COMMUNITY)
Admission: EM | Admit: 2010-08-29 | Discharge: 2010-08-29 | Disposition: A | Discharge status: Home / Self Care | Payer: Medicare Other | Attending: Emergency Medicine | Admitting: Emergency Medicine

## 2010-08-29 ENCOUNTER — Emergency Department (HOSPITAL_COMMUNITY): Payer: Medicare Other

## 2010-08-29 DIAGNOSIS — Z7982 Long term (current) use of aspirin: Secondary | ICD-10-CM | POA: Insufficient documentation

## 2010-08-29 DIAGNOSIS — F17200 Nicotine dependence, unspecified, uncomplicated: Secondary | ICD-10-CM | POA: Insufficient documentation

## 2010-08-29 DIAGNOSIS — Z7901 Long term (current) use of anticoagulants: Secondary | ICD-10-CM | POA: Insufficient documentation

## 2010-08-29 DIAGNOSIS — R05 Cough: Secondary | ICD-10-CM | POA: Insufficient documentation

## 2010-08-29 DIAGNOSIS — Z79899 Other long term (current) drug therapy: Secondary | ICD-10-CM | POA: Insufficient documentation

## 2010-08-29 DIAGNOSIS — R0602 Shortness of breath: Secondary | ICD-10-CM | POA: Insufficient documentation

## 2010-08-29 DIAGNOSIS — E789 Disorder of lipoprotein metabolism, unspecified: Secondary | ICD-10-CM | POA: Insufficient documentation

## 2010-08-29 DIAGNOSIS — J42 Unspecified chronic bronchitis: Secondary | ICD-10-CM | POA: Insufficient documentation

## 2010-08-29 DIAGNOSIS — I4891 Unspecified atrial fibrillation: Secondary | ICD-10-CM | POA: Insufficient documentation

## 2010-08-29 DIAGNOSIS — E119 Type 2 diabetes mellitus without complications: Secondary | ICD-10-CM | POA: Insufficient documentation

## 2010-08-29 DIAGNOSIS — R059 Cough, unspecified: Secondary | ICD-10-CM | POA: Insufficient documentation

## 2010-08-29 DIAGNOSIS — E669 Obesity, unspecified: Secondary | ICD-10-CM | POA: Insufficient documentation

## 2010-08-29 LAB — BASIC METABOLIC PANEL
CO2: 23 mEq/L (ref 19–32)
Chloride: 100 mEq/L (ref 96–112)
GFR calc Af Amer: >60 mL/min (ref >60)
Sodium: 135 mEq/L (ref 135–145)

## 2010-08-29 LAB — CBC
HCT: 38.2 % — ABNORMAL LOW (ref 39.0–52.0)
Hemoglobin: 12.4 g/dL — ABNORMAL LOW (ref 13.0–17.0)
MCH: 25.6 pg — ABNORMAL LOW (ref 26.0–34.0)
MCHC: 32.5 g/dL (ref 30.0–36.0)
MCV: 78.8 fL (ref 78.0–100.0)

## 2010-08-29 LAB — DIFFERENTIAL
Basophils Relative: 1 % (ref 0–1)
Lymphocytes Relative: 40 % (ref 12–46)
Lymphs Abs: 3.1 10*3/uL (ref 0.7–4.0)
Monocytes Absolute: 0.9 10*3/uL (ref 0.1–1.0)
Monocytes Relative: 11 % (ref 3–12)
Neutro Abs: 3.3 10*3/uL (ref 1.7–7.7)

## 2010-08-29 LAB — TROPONIN I: Troponin I: <0.3 ng/mL (ref <0.30)

## 2010-08-29 LAB — GLUCOSE, CAPILLARY: Glucose-Capillary: 160 mg/dL — ABNORMAL HIGH (ref 70–99)

## 2010-09-01 NOTE — Discharge Summary (Signed)
[name], [name]             ACCOUNT NO.:  [id number]  MEDICAL RECORD NO.:  [id number]           PATIENT TYPE:  I  LOCATION:  2003                         FACILITY:  [facility]  PHYSICIAN:  Landry Corporal, MD DATE OF BIRTH:  [birth year]  DATE OF ADMISSION:  08/17/2010 DATE OF DISCHARGE:  08/19/2010                              DISCHARGE SUMMARY   DISCHARGE DIAGNOSES: 1. Recurrent atrial fibrillation and atrial flutter, converted     spontaneously to sinus rhythm this admission with the addition of     Cardizem. 2. History of paroxysmal atrial fibrillation and atrial flutter status     post radiofrequency ablation Aug 05, 2010. 3. Chest pressure and chest pain related to atrial fibrillation and     atrial flutter, resolved with rate control, the patient has no     significant coronary artery disease by catheterization April 11, 2010. 4. Preserved left ventricular function with an ejection fraction of     55%. 5. Hypertension, treated. 6. Type 2 non-insulin-dependent diabetes. 7. Chronic obstructive pulmonary disease with a mild exacerbation this     admission and improved at discharge.  HOSPITAL COURSE:  The patient is a 70 year old male who has seen Dr. Herbie Baltimore in the office.  He has had recurrent PAF and atrial flutter.  He had a catheterization April 12, 2010, showing only minor LAD narrowing of 20%.  He has good LV function.  The patient was seen by Dr. Johney Frame and had RF ablation and his Coumadin was discontinued.  He was readmitted Aug 17, 2010, with 48 hours of chest pressure and found to be back in atrial fibrillation.  One of his EKGs looks like atrial flutter with variable ventricular response.  The patient was admitted to telemetry, started on heparin and Coumadin.  Troponins were negative. His TSH was normal.  Diltiazem was added to Toprol and Lanoxin.  He converted spontaneously to sinus rhythm.  We suspect the patient most likely has sleep apnea as  well based on his obesity.  We feel he can be discharged Aug 19, 2010, on Lovenox and Coumadin crossover.  I will have to keep an eye on his INR as his LFTs were mildly elevated at discharge with an SGOT of 62 and SGPT 67.  He is on a low dose of Pravachol.  We will have an INR checked Tuesday and he will need a followup CMET as an outpatient.  Please see med rec for complete discharge medications.  LABORATORY DATA:  White count 5.2, hemoglobin 11.6, hematocrit 36.2, platelets 191, INR 1.06.  Sodium 135, potassium 4.0, BUN 15, creatinine 1.09, magnesium 2.0.  His LFTs were SGOT 62, SGPT 67.  TSH 0.988. Troponins were negative.  Dig levels less than 0.3.  Chest x-ray shows no acute abnormalities.  DISPOSITION:  The patient is discharged in stable condition.  Please see med rec for complete discharge medications.  We added diltiazem to his metoprolol and stopped his Lanoxin.  We did add Coumadin with Lovenox crossover.  He will need followup INR on Tuesday and this will be arranged.  He will need followup LFTs as  an outpatient.     Abelino Derrick, P.A.   ______________________________ Landry Corporal, MD    LKK/MEDQ  D:  08/19/2010  T:  08/20/2010  Job:  [id number]  cc:   Hillis Range, MD Tracey Harries, M.D.  Electronically Signed by Corine Shelter P.A. on 08/25/2010 04:27:07 PM Electronically Signed by Bryan Lemma MD on 09/01/2010 12:43:28 AM

## 2010-09-03 NOTE — H&P (Signed)
[name], [name]             ACCOUNT NO.:  [id number]  MEDICAL RECORD NO.:  [id number]           PATIENT TYPE:  I  LOCATION:  2003                         FACILITY:  [facility]  PHYSICIAN:  Landry Corporal, MD DATE OF BIRTH:  02/25/1941  DATE OF ADMISSION:  08/17/2010 DATE OF DISCHARGE:                             HISTORY & PHYSICAL   CHIEF COMPLAINT:  Rapid heart rate with bilateral arm soreness and discomfort in the chest up into the neck, also several days of diarrhea and shortness of breath.  HISTORY OF PRESENT ILLNESS:  A 70 year old white male patient that presents to the emergency room today and found to be in rapid atrial fibrillation.  He has a history of a flutter and atrial fibrillation. We have seen several times.  He recently on Aug 05, 2010, underwent EP study and radiofrequency ablation of the supraventricular tachycardia. At discharge, his Coumadin was discontinued.  The patient states over the last several days he has had diarrhea greater than 8 stools of liquid a day, and not feeling well.  Today, it was worse.  His arms were aching, chest discomfort, more of soreness. He took his blood pressure, and his heart rate was 129.  Called the EMS. By that time, his heart rate on the EMS was 140, here in the ER, it was 161 in AFib with rapid ventricular response.  He was given 20 mg bolus of Cardizem and a drip started at 5, rate slowed to around the 100, but with any activity, the heart rate will go back up to 120-140.  Even talking will cause his heart rate to go back up, but his symptoms have pretty much resolved.  He still has little discomfort in his neck, but his arms and chest discomfort is resolved.  He is not having diarrhea since he has been in the ER either.  PAST MEDICAL HISTORY:  As stated with SVT, atrial flutter, and AFib.  He has hypertension, hyperlipidemia, type 2 diabetes mellitus, COPD with long-term smoking, cervical radiculopathy, chronic  back pain, and chronic sinusitis.  He also in January had prolonged hospitalization for acute cholecystitis and status post laparoscopic cholecystectomy, acute gallstone, pancreatitis, which resolved and in January as well, he had a cardiac catheterization for positive nuclear study, and he had nonobstructive coronary artery disease.  Cardiac cath, he had 20-30% LAD proximal stenosis, minimal irregularities in other vessels, and EF was 55%.  The patient was in sinus rhythm during that time frame.  Recent medications at the time of discharge, Aug 05, 2010, aspirin 81 daily, his Coumadin had been discontinued, ranitidine 10 mg daily, metformin 500 t.i.d., and Lanoxin 0.125 mg daily, Toprol-XL 100 mg daily, pravastatin 20 mg daily, and hydrocortisone, pramoxine, ? med twice daily presumably for hemorrhoids.  He was to follow up with Dr. Johney Frame who did the procedure in 4 weeks.  FAMILY HISTORY:  Mother died at 39 of breast cancer.  She was a nonsmoker and had a history of thyroid disease.  Father died at age 18 with heart attack and actually his heart disease began earlier than 34. The patient and has two brothers and  three sisters, one older brother died of an MI at age 2.  He also was a smoker.  SOCIAL HISTORY:  The patient smokes one pack a day for at least 50 years.  He does not drink.  He lives here in [place] just moved recently from [place], [name], in the last year.  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  ALLERGIES:  Intolerant to TYLENOL.  REVIEW OF SYSTEMS:  GENERAL:  He had been doing fairly well.  No colds or fevers except for his chronic sinusitis.  SKIN:  No rashes or ulcers. HEENT:  No blurred vision.  CARDIOVASCULAR:  Chest discomfort as stated, rapid heart rate.  PULMONARY:  Short of breath with the tachycardia.  He states that always feels the same when he is in his atrial fib.  GI: Diarrhea.  He denies any blood in the stools.  No abdominal  cramping. GU:  No hematuria or dysuria.  MUSCULOSKELETAL:  Chronic back pain, stable at the current time.  NEURO:  No syncope.  PHYSICAL EXAMINATION:  VITAL SIGNS:  Blood pressure 115/74, pulse 161, respirations 23, temperature 99.4, FPO2 94%. GENERAL:  Alert, talkative, pleasant white male. HEENT:  Normocephalic.  Sclerae clear. NECK:  Supple.  No JVD.  No bruits detected. HEART:  Irregularly regular with rapid heart rate. LUNGS:  Fairly clear without rales, rhonchi, or wheezes. ABDOMEN:  Obese, soft, nontender, positive bowel sounds.  I could not palpate liver, spleen, or masses. LOWER EXTREMITIES:  No edema, 2+ pedals bilaterally. SKIN:  Warm and dry.  Brisk capillary refill. NEURO:  Alert and oriented x3.  Follows commands.  Sodium 135, potassium 4.4, BUN 15, creatinine 1.10, glucose 197. Hemoglobin 13.3, hematocrit 42, platelets 214, WBC 7.9, myoglobin 107, CK-MB 1.2, troponin-I less than 0.05.  Chest x-ray, no acute abnormalities and EKG AFib with rapid ventricular response without acute EKG changes, otherwise.  ASSESSMENT: 1. Atrial fibrillation with rapid ventricular response. 2. Chest discomfort secondary to her rapid heart that will rule out     myocardial infarction. 3. Diabetes mellitus, type 2. 4. Patent coronary arteries in January of this year. 5. Recent SVT abalation.  PLAN:  IV heparin, IV Cardizem to control heart rate, continue beta blocker, O2 two L.  Serial CK-MBs.  Dr. Herbie Baltimore will see for further orders.     Darcella Gasman. Annie Paras, N.P.   ______________________________ Landry Corporal, MD    LRI/MEDQ  D:  08/17/2010  T:  08/18/2010  Job:  [id number]  cc:   Tracey Harries, M.D. Dr. Ladona Ridgel  Electronically Signed by Nada Boozer N.P. on 09/03/2010 03:18:09 PM Electronically Signed by Bryan Lemma MD on 09/03/2010 06:16:52 PM

## 2010-09-23 ENCOUNTER — Encounter: Payer: Medicare Other | Admitting: Internal Medicine

## 2010-10-07 ENCOUNTER — Emergency Department (HOSPITAL_COMMUNITY): Payer: Medicare Other

## 2010-10-07 ENCOUNTER — Encounter: Payer: Self-pay | Admitting: Internal Medicine

## 2010-10-07 ENCOUNTER — Ambulatory Visit (INDEPENDENT_AMBULATORY_CARE_PROVIDER_SITE_OTHER): Payer: Medicare Other | Admitting: Internal Medicine

## 2010-10-07 ENCOUNTER — Inpatient Hospital Stay (HOSPITAL_COMMUNITY)
Admission: EM | Admit: 2010-10-07 | Discharge: 2010-10-10 | DRG: 310 | Disposition: A | Discharge status: Home / Self Care | Payer: Medicare Other | Attending: Internal Medicine | Admitting: Internal Medicine

## 2010-10-07 VITALS — BP 126/72 | HR 134 | Ht 66.0 in | Wt 217.0 lb

## 2010-10-07 DIAGNOSIS — E119 Type 2 diabetes mellitus without complications: Secondary | ICD-10-CM | POA: Diagnosis present

## 2010-10-07 DIAGNOSIS — Z7901 Long term (current) use of anticoagulants: Secondary | ICD-10-CM

## 2010-10-07 DIAGNOSIS — R002 Palpitations: Secondary | ICD-10-CM

## 2010-10-07 DIAGNOSIS — M549 Dorsalgia, unspecified: Secondary | ICD-10-CM | POA: Diagnosis present

## 2010-10-07 DIAGNOSIS — F17200 Nicotine dependence, unspecified, uncomplicated: Secondary | ICD-10-CM | POA: Diagnosis present

## 2010-10-07 DIAGNOSIS — I4891 Unspecified atrial fibrillation: Principal | ICD-10-CM | POA: Diagnosis present

## 2010-10-07 DIAGNOSIS — J4489 Other specified chronic obstructive pulmonary disease: Secondary | ICD-10-CM | POA: Diagnosis present

## 2010-10-07 DIAGNOSIS — I4892 Unspecified atrial flutter: Secondary | ICD-10-CM | POA: Diagnosis present

## 2010-10-07 DIAGNOSIS — J449 Chronic obstructive pulmonary disease, unspecified: Secondary | ICD-10-CM | POA: Diagnosis present

## 2010-10-07 DIAGNOSIS — Z23 Encounter for immunization: Secondary | ICD-10-CM

## 2010-10-07 DIAGNOSIS — I509 Heart failure, unspecified: Secondary | ICD-10-CM

## 2010-10-07 DIAGNOSIS — I4819 Other persistent atrial fibrillation: Secondary | ICD-10-CM | POA: Insufficient documentation

## 2010-10-07 DIAGNOSIS — Z7982 Long term (current) use of aspirin: Secondary | ICD-10-CM

## 2010-10-07 DIAGNOSIS — I5032 Chronic diastolic (congestive) heart failure: Secondary | ICD-10-CM

## 2010-10-07 DIAGNOSIS — E785 Hyperlipidemia, unspecified: Secondary | ICD-10-CM | POA: Diagnosis present

## 2010-10-07 DIAGNOSIS — I10 Essential (primary) hypertension: Secondary | ICD-10-CM | POA: Diagnosis present

## 2010-10-07 LAB — POCT I-STAT, CHEM 8
BUN: 18 mg/dL (ref 6–23)
Creatinine, Ser: 1.2 mg/dL (ref 0.50–1.35)
Hemoglobin: 14.3 g/dL (ref 13.0–17.0)
Potassium: 3.7 mEq/L (ref 3.5–5.1)
Sodium: 140 mEq/L (ref 135–145)

## 2010-10-07 LAB — DIFFERENTIAL
Basophils Absolute: 0.1 10*3/uL (ref 0.0–0.1)
Basophils Relative: 1 % (ref 0–1)
Lymphocytes Relative: 39 % (ref 12–46)
Monocytes Absolute: 0.7 10*3/uL (ref 0.1–1.0)
Monocytes Relative: 7 % (ref 3–12)
Neutro Abs: 4.6 10*3/uL (ref 1.7–7.7)
Neutrophils Relative %: 51 % (ref 43–77)

## 2010-10-07 LAB — CBC
HCT: 40.3 % (ref 39.0–52.0)
Hemoglobin: 13.1 g/dL (ref 13.0–17.0)
MCHC: 32.5 g/dL (ref 30.0–36.0)
RBC: 5.23 MIL/uL (ref 4.22–5.81)

## 2010-10-07 LAB — CK TOTAL AND CKMB (NOT AT ARMC)
CK, MB: 2.5 ng/mL (ref 0.3–4.0)
Relative Index: 1.7 (ref 0.0–2.5)
Total CK: 144 U/L (ref 7–232)

## 2010-10-07 LAB — PROTIME-INR
INR: 1.71 — ABNORMAL HIGH (ref 0.00–1.49)
Prothrombin Time: 20.4 seconds — ABNORMAL HIGH (ref 11.6–15.2)

## 2010-10-07 MED ORDER — DILTIAZEM HCL ER COATED BEADS 180 MG PO CP24: 180.0000 mg | ORAL_CAPSULE | Freq: Two times a day (BID) | ORAL | Status: DC

## 2010-10-07 NOTE — Assessment & Plan Note (Addendum)
Doing well s/p atrial flutter ablation but unfortunately he has developed symptomatic persistent atrial fibrillation with RVR. I would like to pursue sinus rhythm, though unfortunately, review of his recent INRs reveals that they have been subtherapeutic. I will therefore increase diltiazem CD to 180mg  BID today for rate control. He will continue coumadin and start flecainide 100mg  BID once his INRs are stable above 2.  If we are unable to maintaing consistent INRs then he should be switched to Xarelto or Pradaxa.  I have spoke with Dr Herbie Baltimore who will assist with this plan.  He will see Dr Herbie Baltimore as scheduled and I will see him again in 4 weeks.  He will need weekly INRs in the interim.

## 2010-10-07 NOTE — Patient Instructions (Signed)
Your physician recommends that you schedule a follow-up appointment in: 4 weeks with Dr Johney Frame  Your physician has recommended you make the following change in your medication: increase Diltiazem to 180mg  twice daily

## 2010-10-07 NOTE — Assessment & Plan Note (Signed)
Stable Salt restriction euvolemic on exam today.

## 2010-10-07 NOTE — Progress Notes (Signed)
The patient presents today for electrophysiology followup.  Since having his atrial flutter ablation, the patient reports doing reasonably well.  He denies procedure related complications.  Unfortunately, he has developed afib.  He reports symptoms of fatigue and weakness with afib.  He has been restarted on coumadin but has had difficulty maintaining a therapeutic INR.  Today, he denies symptoms of palpitations, chest pain, shortness of breath, orthopnea, PND, lower extremity edema, dizziness, presyncope, syncope, or neurologic sequela.  The patient feels that he is tolerating medications without difficulties and is otherwise without complaint today.   Past Medical History  Diagnosis Date  . Cholecystitis 04/22/2010    acute s/p cholecystectomy  . Gallstone pancreatitis 04/22/10  . Atrial flutter     s/p CTI ablation by [facility] 08/05/10  . Diabetes mellitus type II   . Hyperlipidemia   . HTN (hypertension)   . Diastolic CHF, chronic     preserved EF  . Atrial fibrillation    Past Surgical History  Procedure Date  . Cholecystectomy 1/12  . Atrial ablation surgery 08/05/10    CTI ablation for atrial flutter by [facility]    Current Outpatient Prescriptions  Medication Sig Dispense Refill  . aspirin 81 MG tablet Take 81 mg by mouth daily.        [name] diltiazem (CARDIZEM CD) 180 MG 24 hr capsule Take 1 capsule (180 mg total) by mouth 2 (two) times daily.  60 capsule  6  . glipiZIDE (GLUCOTROL XL) 10 MG 24 hr tablet Take 10 mg by mouth daily.        . Hydrocortisone Ace-Pramoxine (PROCTOCREAM-HC RE) Place rectally. Prn        . loratadine (CLARITIN) 10 MG tablet Take 10 mg by mouth daily.        . metFORMIN (GLUCOPHAGE) 1000 MG tablet Take 1,000 mg by mouth 2 (two) times daily with a meal.        . metoprolol (TOPROL-XL) 50 MG 24 hr tablet Take 100 mg by mouth daily.       . nitroGLYCERIN (NITROSTAT) 0.4 MG SL tablet Place 0.4 mg under the tongue every 5 (five) minutes as needed.        . pravastatin  (PRAVACHOL) 20 MG tablet Take 20 mg by mouth daily.        [name] warfarin (COUMADIN) 5 MG tablet Take 5 mg by mouth daily.        [name] DISCONTD: diltiazem (CARDIZEM CD) 180 MG 24 hr capsule Take 180 mg by mouth daily.          No Known Allergies  History   Social History  . Marital Status: Widowed    Spouse Name: [name]    Number of Children: [name]  . Years of Education: [name]   Occupational History  . Not on file.   Social History Main Topics  . Smoking status: Current Everyday Smoker -- 1.0 packs/day for 50 years    Types: Cigarettes  . Smokeless tobacco: Never Used  . Alcohol Use: No  . Drug Use: No  . Sexually Active: Not on file   Other Topics Concern  . Not on file   Social History Narrative    The patient lives with his wife at home    Family History  Problem Relation Age of Onset  . Cancer Mother   . Heart attack Father     ROS-  All systems are reviewed and are negative except as outlined in the HPI above  Physical Exam: Filed Vitals:   10/07/10 1112  BP: 126/72  Pulse: 134  Height: 5\' 6"  (1.676 m)  Weight: 217 lb (98.431 kg)    GEN- The patient is obese appearing, alert and oriented x 3 today.   Head- normocephalic, atraumatic Eyes-  Sclera clear, conjunctiva pink Ears- hearing intact Oropharynx- clear Neck- supple, no JVP Lymph- no cervical lymphadenopathy Lungs- Clear to ausculation bilaterally, normal work of breathing Heart- tachy irregular rate and rhythm, no murmurs, rubs or gallops, PMI not laterally displaced GI- soft, NT, ND, + BS Extremities- no clubbing, cyanosis, 1+ edema MS- no significant deformity or atrophy Skin- no rash or lesion Psych- euthymic mood, full affect Neuro- strength and sensation are intact  ekg today reveals afib with RVR, V rats 120s, nonspecific St/T changes  Assessment and Plan:

## 2010-10-07 NOTE — Assessment & Plan Note (Signed)
Stable No change required today  

## 2010-10-08 DIAGNOSIS — I4891 Unspecified atrial fibrillation: Secondary | ICD-10-CM

## 2010-10-08 LAB — HEPARIN LEVEL (UNFRACTIONATED): Heparin Unfractionated: 0.2 IU/mL — ABNORMAL LOW (ref 0.30–0.70)

## 2010-10-08 LAB — MRSA PCR SCREENING: MRSA by PCR: NEGATIVE

## 2010-10-08 LAB — GLUCOSE, CAPILLARY
Glucose-Capillary: 142 mg/dL — ABNORMAL HIGH (ref 70–99)
Glucose-Capillary: 88 mg/dL (ref 70–99)
Glucose-Capillary: 105 mg/dL — ABNORMAL HIGH (ref 70–99)

## 2010-10-09 LAB — HEPARIN LEVEL (UNFRACTIONATED): Heparin Unfractionated: <0.1 IU/mL — ABNORMAL LOW (ref 0.30–0.70)

## 2010-10-09 LAB — GLUCOSE, CAPILLARY: Glucose-Capillary: 132 mg/dL — ABNORMAL HIGH (ref 70–99)

## 2010-10-09 LAB — CBC
Hemoglobin: 11.4 g/dL — ABNORMAL LOW (ref 13.0–17.0)
RBC: 4.64 MIL/uL (ref 4.22–5.81)
WBC: 7.9 10*3/uL (ref 4.0–10.5)

## 2010-10-09 LAB — PROTIME-INR: Prothrombin Time: 21.8 seconds — ABNORMAL HIGH (ref 11.6–15.2)

## 2010-10-10 LAB — BASIC METABOLIC PANEL
BUN: 14 mg/dL (ref 6–23)
CO2: 25 mEq/L (ref 19–32)
Calcium: 8.4 mg/dL (ref 8.4–10.5)
Chloride: 103 mEq/L (ref 96–112)
Creatinine, Ser: 0.97 mg/dL (ref 0.50–1.35)
GFR calc Af Amer: >60 mL/min (ref >60)
GFR calc non Af Amer: >60 mL/min (ref >60)
Glucose, Bld: 113 mg/dL — ABNORMAL HIGH (ref 70–99)
Potassium: 3.9 mEq/L (ref 3.5–5.1)
Sodium: 137 mEq/L (ref 135–145)

## 2010-10-10 LAB — GLUCOSE, CAPILLARY
Glucose-Capillary: 123 mg/dL — ABNORMAL HIGH (ref 70–99)
Glucose-Capillary: 128 mg/dL — ABNORMAL HIGH (ref 70–99)

## 2010-10-10 LAB — PROTIME-INR: Prothrombin Time: 22.1 seconds — ABNORMAL HIGH (ref 11.6–15.2)

## 2010-10-10 LAB — HEPARIN LEVEL (UNFRACTIONATED): Heparin Unfractionated: 0.48 IU/mL (ref 0.30–0.70)

## 2010-10-15 NOTE — Discharge Summary (Addendum)
[name]             ACCOUNT NO.:  [id number]  MEDICAL RECORD NO.:  [id number]  LOCATION:  4702                         FACILITY:  [facility]  PHYSICIAN:  Hillis Range, MD       DATE OF BIRTH:  Nov 22, 1940  DATE OF ADMISSION:  10/07/2010 DATE OF DISCHARGE:  10/10/2010                              DISCHARGE SUMMARY   DISCHARGE DIAGNOSES: 1. Atrial fibrillation with rapid ventricular response.     a.     History of atrial fibrillation, atrial flutter status post      cavotricuspid isthmus ablation in May 2012.     b.     Spontaneously converted to sinus rhythm this admission,      initiated on flecainide.     c.     Maintained on Coumadin for anticoagulation. 2. Hypertension, 3. Hyperlipidemia. 4. Diabetes mellitus. 5. Chronic obstructive pulmonary disease. 6. Tobacco abuse. 7. Cervical radiculopathy. 8. Chronic back pain. 9. History of acute cholecystitis status post cholecystectomy. 10.History of nonobstructive coronary artery disease by cath in     January 2012. 11.Normal left ventricular function with EF of 55% by echo on March 25, 2010.  HOSPITAL COURSE:  [name] is a 70 year old gentleman with a history of known atrial fibrillation/flutter status post CTI ablation, COPD, diabetes mellitus who presented with palpitations, found to be in atrial fibrillation with RVR with a heart rate in the 140s in the ER.  He was given 20 mg of Cardizem bolus followed by a drip.  Initial blood pressure is around the lower side at 97/57.  An EKG demonstrated atrial fibrillation with RVR, 138 beats per minute, but otherwise no significant ST or T changes.  The diltiazem drip helped to better control him, this was continued as the patient was admitted to the hospital.  He was also continued on metoprolol XL 150 mg p.o. at bedtime.  His INR was subtherapeutic, so his Coumadin dosing was adjusted.  While on flecainide, the patient subsequently converted to sinus rhythm  on his own.  He was feeling much better and wants to go home.  Dr. Johney Frame has seen and examined him today and feels he is stable for discharge.  DISCHARGE LABS:  INR 1.9.  Sodium 137, potassium 3.9, chloride 103, CO2 25, glucose 111, BUN 14, creatinine 0.9 at present.  INR 1.9.  WBC 7.9, hemoglobin 11.4, hematocrit 36.7, and platelet count 234.  STUDIES:  Chest x-ray showed stable mild chronic changes without acute abnormality.  DISCHARGE MEDICATIONS: 1. Flecainide 100 mg b.i.d. 2. Coumadin 5 mg daily.  Per discussion with pharmacy, the patient is     to take 1-1/2 tablet daily until his INR checked given the fact     that his INR was subtherapeutic this admission and his home dose     may need adjusting. 3. Aspirin 81 mg daily. 4. Diltiazem 180 mg daily. 5. Glipizide 10 mg daily every morning. 6. Loratadine 10 mg every morning. 7. Metformin 1000 mg b.i.d. 8. Metoprolol succinate 50 mg 2 tablets at bedtime. 9. Nitroglycerin sublingual 0.4 mg every 5 minutes as needed up to 3     doses for chest pain. 10.Pravastatin  20 mg daily.  DISPOSITION:  [name] will be discharged in stable condition home. He is to follow a low-sodium, heart-healthy diet, and to call or return if he notices any pain, swelling, bleeding, or pus at the cath site.  He will follow up with Dr. Herbie Baltimore in the [facility] and Vascular office as well as get his INR checked in 2-3 days.  Those appointments are currently being scheduled prior to discharge.  He will also follow up with Dr. Johney Frame as scheduled.  DURATION OF DISCHARGE ENCOUNTER:  Greater than 30 minutes including physician and PA time.     Ronie Spies, P.A.C.   ______________________________ Hillis Range, MD    DD/MEDQ  D:  10/10/2010  T:  10/10/2010  Job:  [id number]  cc:   Landry Corporal, MD  Electronically Signed by Ronie Spies  on 10/15/2010 04:54:09 PM Electronically Signed by Hillis Range MD on 10/17/2010 09:59:57 AM

## 2010-11-14 ENCOUNTER — Ambulatory Visit: Payer: Medicare Other | Admitting: Internal Medicine

## 2010-12-06 ENCOUNTER — Inpatient Hospital Stay (HOSPITAL_COMMUNITY)
Admission: EM | Admit: 2010-12-06 | Discharge: 2010-12-07 | DRG: 310 | Disposition: A | Discharge status: Home Health | Payer: Medicare Other | Attending: Cardiology | Admitting: Cardiology

## 2010-12-06 ENCOUNTER — Emergency Department (HOSPITAL_COMMUNITY): Payer: Medicare Other

## 2010-12-06 DIAGNOSIS — J4489 Other specified chronic obstructive pulmonary disease: Secondary | ICD-10-CM | POA: Diagnosis present

## 2010-12-06 DIAGNOSIS — R0789 Other chest pain: Secondary | ICD-10-CM | POA: Diagnosis present

## 2010-12-06 DIAGNOSIS — J449 Chronic obstructive pulmonary disease, unspecified: Secondary | ICD-10-CM | POA: Diagnosis present

## 2010-12-06 DIAGNOSIS — E119 Type 2 diabetes mellitus without complications: Secondary | ICD-10-CM | POA: Diagnosis present

## 2010-12-06 DIAGNOSIS — I4891 Unspecified atrial fibrillation: Principal | ICD-10-CM | POA: Diagnosis present

## 2010-12-06 DIAGNOSIS — R002 Palpitations: Secondary | ICD-10-CM

## 2010-12-06 DIAGNOSIS — F17200 Nicotine dependence, unspecified, uncomplicated: Secondary | ICD-10-CM | POA: Diagnosis present

## 2010-12-06 DIAGNOSIS — Z7901 Long term (current) use of anticoagulants: Secondary | ICD-10-CM

## 2010-12-06 DIAGNOSIS — M549 Dorsalgia, unspecified: Secondary | ICD-10-CM | POA: Diagnosis present

## 2010-12-06 DIAGNOSIS — I2510 Atherosclerotic heart disease of native coronary artery without angina pectoris: Secondary | ICD-10-CM | POA: Diagnosis present

## 2010-12-06 DIAGNOSIS — Z7982 Long term (current) use of aspirin: Secondary | ICD-10-CM

## 2010-12-06 LAB — CBC
HCT: 40.7 % (ref 39.0–52.0)
Hemoglobin: 13.4 g/dL (ref 13.0–17.0)
MCV: 77.7 fL — ABNORMAL LOW (ref 78.0–100.0)
RDW: 16.5 % — ABNORMAL HIGH (ref 11.5–15.5)
WBC: 9.7 10*3/uL (ref 4.0–10.5)

## 2010-12-06 LAB — COMPREHENSIVE METABOLIC PANEL
Albumin: 3.5 g/dL (ref 3.5–5.2)
Alkaline Phosphatase: 79 U/L (ref 39–117)
BUN: 13 mg/dL (ref 6–23)
Chloride: 101 mEq/L (ref 96–112)
Creatinine, Ser: 0.93 mg/dL (ref 0.50–1.35)
GFR calc Af Amer: >60 mL/min (ref >60)
GFR calc non Af Amer: >60 mL/min (ref >60)
Glucose, Bld: 164 mg/dL — ABNORMAL HIGH (ref 70–99)
Total Bilirubin: 0.2 mg/dL — ABNORMAL LOW (ref 0.3–1.2)

## 2010-12-06 LAB — CK TOTAL AND CKMB (NOT AT ARMC): Total CK: 159 U/L (ref 7–232)

## 2010-12-06 LAB — TROPONIN I: Troponin I: <0.3 ng/mL (ref <0.30)

## 2010-12-07 DIAGNOSIS — J449 Chronic obstructive pulmonary disease, unspecified: Secondary | ICD-10-CM

## 2010-12-07 DIAGNOSIS — I4891 Unspecified atrial fibrillation: Secondary | ICD-10-CM

## 2010-12-07 HISTORY — PX: CARDIOVERSION: SHX1299

## 2010-12-07 LAB — GLUCOSE, CAPILLARY: Glucose-Capillary: 153 mg/dL — ABNORMAL HIGH (ref 70–99)

## 2010-12-07 LAB — BASIC METABOLIC PANEL
BUN: 13 mg/dL (ref 6–23)
Calcium: 8.5 mg/dL (ref 8.4–10.5)
Creatinine, Ser: 0.89 mg/dL (ref 0.50–1.35)
GFR calc non Af Amer: >60 mL/min (ref >60)
Glucose, Bld: 150 mg/dL — ABNORMAL HIGH (ref 70–99)

## 2010-12-07 LAB — CARDIAC PANEL(CRET KIN+CKTOT+MB+TROPI)
CK, MB: 3.2 ng/mL (ref 0.3–4.0)
Total CK: 177 U/L (ref 7–232)

## 2010-12-16 NOTE — Discharge Summary (Signed)
[name], [name]             ACCOUNT NO.:  [id number]  MEDICAL RECORD NO.:  [id number]  LOCATION:  2914                         FACILITY:  [facility]  PHYSICIAN:  Landry Corporal, MD DATE OF BIRTH:  1940/04/12  DATE OF ADMISSION:  12/06/2010 DATE OF DISCHARGE:  12/07/2010                              DISCHARGE SUMMARY   DISCHARGE DIAGNOSES: 1. Atrial fibrillation with rapid ventricular response status post     direct current cardioversion on December 07, 2010, to normal sinus     rhythm. 2. Diabetes mellitus. 3. Hypertension. 4. Hyperlipidemia. 5. Chronic obstructive pulmonary disease. 6. Tobacco abuse. 7. Cervical radiculopathy. 8. Chronic back pain. 9. History of acute cholecystitis status post cholecystectomy. 10.History of nonobstructive coronary artery disease by cath in     January 2012. 11.Normal left ventricular function with EF of 55% by echo in March 25, 2010.  HOSPITAL COURSE:  [name] is a 70 year old male with a history of atrial fibrillation status post atrial status post cavotricuspid isthmus ablation in May 2012.  He has been on flecainide since May 2012, who presented with palpitations which he said began abruptly while he was sitting at home.  The patient was found to be in atrial fibrillation on EKG with heart rate in the 120s, started on IV diltiazem infusion at 5 mg an hour and his rate slowed down in to the 80s.  At that point, he was asymptomatic.  His flecainide was increased to 150 mg b.i.d. and Cardizem increased to 240 mg daily.  He was set up for DC cardioversion which was completed on December 07, 2010, and currently maintaining sinus rhythm after cardioversion.  He also had tobacco cessation provided.  Currently, [name] feels great.  He has been seen by Dr. Herbie Baltimore who feels he is ready for discharge home at approximately 4:30 today.  DISCHARGE LABS:  WBCs 9.7, hemoglobin 13.4, hematocrit 40.7, platelets 302.  PT 26.6,  INR 2.41.  Sodium 137, potassium 4.2, chloride 104, carbon dioxide 26, glucose 150, BUN 13 creatinine 0.89, calcium 8.5, magnesium 9.1.  Cardiac enzymes were cycled and negative x2.  Thyroid stimulating hormone was 2.09.  MRSA negative by PCR.  STUDIES/PROCEDURES: 1. Chest x-ray, December 06, 2010, showed no focal consolidations.     Cardiomegaly to upper normal limits with central vascular     peribronchial prominence.  This is similar to prior.  No focal     consolidation, no pleural effusions, no pneumothorax, no acute     osseous abnormality. 2. DC cardioversion completed on December 07, 2010, from atrial     fibrillation to normal sinus rhythm.  DISCHARGE MEDICATIONS: 1. Diltiazem 240 mg 1 tablet by mouth daily. 2. Flecainide 100 mg 1-1/2 tablets by mouth twice daily. 3. Aspirin enteric coated 81 mg 1 tablet by mouth daily. 4. Glipizide XL 10 mg 1 tablet by mouth every morning. 5. Loratadine 10 mg over the counter 1 tablet by mouth every morning. 6. Metformin 1000 mg 1 tablet by mouth twice daily. 7. Metoprolol succinate 50 mg XL 2 tablets by mouth every evening. 8. Nitroglycerin sublingual 0.4 mg 1 tablet every 5 minutes up to 3  doses as needed for chest pain. 9. Warfarin 5 mg 1 tablet on Monday, Wednesday, and Friday, 1-1/2     tablets on Sunday, Tuesday, Thursday, and Saturday. 10.Pravastatin 20 mg 1 tablet by mouth daily.  DISPOSITION:  [name] will be discharged home in stable conditions. It was recommended he increase his activity slowly.  He should also eat a low-sodium, heart-healthy diet.  He will follow up with Dr. Herbie Baltimore, our office will call him with the appointment time.    ______________________________ Wilburt Finlay, PA   ______________________________ Landry Corporal, MD    BH/MEDQ  D:  12/07/2010  T:  12/08/2010  Job:  [id number]  Electronically Signed by Wilburt Finlay PA on 12/09/2010 02:51:31 PM Electronically Signed by Bryan Lemma MD on  12/16/2010 11:25:37 PM

## 2010-12-18 NOTE — Cardiovascular Report (Signed)
Trevor, [name]             ACCOUNT NO.:  [id number]  MEDICAL RECORD NO.:  [id number]  LOCATION:  2914                         FACILITY:  [facility]  PHYSICIAN:  [name], MDDATE OF BIRTH:  Nov 30, 1940  DATE OF PROCEDURE:  12/07/2010 DATE OF DISCHARGE:  12/07/2010                           DIRECT CURRENT CARDIOVERSION REPORT  PERFORMING PHYSICIAN:  Trevor Corporal, MD  Airway management by Pulmonary Critical Care Medicine: [name].  PROCEDURES PERFORMED:  Synchronized direct current cardioversion of atrial fibrillation to sinus rhythm.  BRIEF HISTORY:  [name] is a 70 year old gentleman, well known to me who initially presented with rapid atrial fibrillation/flutter and then was initially treated medically, but then persisted to have again.  He then had more distinct flutter in the setting of acute gallstone, cholecystitis.  He was managed medically to this and then had been placed on anticoagulation.  He subsequently underwent atrial flutter ablation and then has had recurrent atrial fibrillation/flutter.  He was recently placed on flecainide and underwent cardioversion, but now, however, presented back on December 06, 2010, with atrial fibrillation with rapid ventricular rate.  He was initially admitted to [name], electrophysiologist performed a seizure.  The patient was controlled excellently with rate controlled, his flecainide dose was increased to 150 mg b.i.d.  The plan was for him to undergo synchronized cardioversion in order to potentially let him be discharged.  He was confirmed to be therapeutic on warfarin for the last month and therefore, the plan was for him to undergo cardioversion.  Multiple other cardioversion scheduled for the day of planned procedure.  This procedure will be delayed by 2 days in order to have Anesthesiology present.  Therefore, the patient was in the coronary care unit.  [name] from pulmonary  critical care agreed to manage the patient's airway with respiratory therapy and his physician extenders.  The risks, benefits, alternatives, and indications were explained to the patient in detail.  Informed consent was obtained with signed form placed on the chart.  PROCEDURE:  With the patient in his CCU bed, the defibrillator pads were placed in the anteroposterior location.  The patient was then sedated by [name] using initially fentanyl and Versed, a total of 75 of fentanyl and 3 of Versed.  However, as he continued to not respond, decision was made to switch him over and give 30 mg of propofol.  The patient was inadequately sedated with the airway managed by respiratory therapy and [name].  With one single shock at 200 joules biphasic with synchronized direct current cardioversion, the patient was restored from atrial flutter into normal sinus rhythm.  This was again confirmed by EKG.  His airway was then managed by respiratory therapy until he was awake and arousable, and protect his own airway.  The patient was stable before, during, and after the procedure with no complications.  IMPRESSION:  Successful direct current cardioversion with atrial fibrillation to normal sinus rhythm.  PLAN:  He will likely be discharged with an increased dose of flecainide 150 mg a day b.i.d.  He will continue on his current dose of diltiazem and will follow up with myself in the next couple of weeks.  ______________________________ Trevor Corporal, MD     DWH/MEDQ  D:  12/16/2010  T:  12/17/2010  Job:  [id number]  cc:   [facility] and V[facility] Center  Electronically Signed by Trevor Lemma MD on 12/18/2010 12:10:37 PM

## 2010-12-21 NOTE — H&P (Signed)
[name], [name]             ACCOUNT NO.:  [id number]  MEDICAL RECORD NO.:  [id number]  LOCATION:  2914                         FACILITY:  [facility]  PHYSICIAN:  Natasha Bence, MD       DATE OF BIRTH:  12-May-1940  DATE OF ADMISSION:  12/06/2010 DATE OF DISCHARGE:                             HISTORY & PHYSICAL   PATIENT'S PRIMARY CARDIOLOGIST:  Hillis Range, M.D.  CHIEF COMPLAINT:  Palpitations and chest discomfort.  HISTORY OF PRESENT ILLNESS:  [name] is a 70 year old male with a history of atrial fibrillation who presents this evening with palpitations.  He said they began abruptly while he was sitting at home, typical of his previous episodes, in which he felt his heart racing.  He also felt some chest tightness across the center of his chest, but it did not radiate.  He did not have any associated shortness of breath, nausea, vomiting, or diaphoresis.  He did have a headache with this episode located in the back of the head; otherwise, no numbness or tingling.  He has not had any recent palpitations prior to this since his last admission for atrial fibrillation with rapid ventricular rate. At that admission, he was started on flecainide and converted back to sinus rhythm.  He has been tolerating the 100 mg of flecainide b.i.d. in addition to his diltiazem and Toprol doses and has not missed any doses; otherwise he has been feeling well.  He denies any dyspnea on exertion, PND or orthopnea.  He has noted some mild lower extremity swelling and some ankle pain in addition to some increased abdominal girth.  He is not sure what his weight has been doing.  REVIEW OF SYSTEMS:  Otherwise, a 12-point review of systems was performed and was otherwise negative except for as stated above.  He denies any fever, chills, or sweats.  He has not had any bleeding or melena.  He denies any rashes or ulcers.  PAST MEDICAL HISTORY: 1. History of recurrent atrial fibrillation, on  flecainide and     Coumadin. 2. History of atrial flutter, status post ablation. 3. Nonobstructive coronary artery disease. 4. Hypertension. 5. COPD. 6. Diabetes. 7. Cervical radiculopathy.  PAST SURGICAL HISTORY: 1. Cholecystectomy. 2. Atrial flutter ablation.  FAMILY HISTORY:  Mom died of breast cancer.  Brother died of an MI at 81.  SOCIAL HISTORY:  He is a 50-pack-year smoker.  No alcohol or illicit drug use.  ALLERGIES:  NO KNOWN DRUG ALLERGIES.  MEDICATIONS: 1. Flecainide 100 mg p.o. b.i.d. 2. Coumadin daily. 3. Aspirin 81 mg p.o. daily. 4. Diltiazem 180 mg of CD daily. 5. Metformin 1000 mg b.i.d. 6. Glipizide 10 mg every morning. 7. Toprol XL 100 mg every night. 8. Nitroglycerin p.r.n. 9. Pravastatin 20 mg daily.  PHYSICAL EXAMINATION:  VITAL SIGNS:  He is afebrile, respiratory rate 18, O2 sats 95% room air, blood pressure 96/52, current heart rate is 85 per minute. GENERAL:  He is an obese white male in no apparent distress. EYES AND EARS:  Anicteric sclera. NECK:  He has normal jugular pressure.  No carotid bruits.  No thyromegaly. LUNGS:  Clear to auscultation bilaterally. HEART:  Irregular, rate is in 80s.  No murmurs, rubs, or gallops. ABDOMEN:  Obese, soft, mildly distended.  No obvious ascites. EXTREMITIES:  Warm with trace pitting edema to his mid shins bilaterally.  He has symmetrical pulses throughout. NEURO:  Cranial nerves are intact.  Symmetrical strength throughout, nonfocal. HEENT:  Mucous membranes are moist. SKIN:  No rashes or ulcers.  LABORATORY DATA:  Sodium 136, potassium 4.3, chloride 101, bicarb of 25, BUN 13, creatinine 0.93, glucose 164, calcium 9.4.  White count was 9.7, hematocrit was 41, platelet count was 302.  CK-MB was 3.1.  Troponin was less than 0.3.  INR was 2.41.  Chest x-ray shows no infiltrates or effusions.  Her initial EKG showed atrial fibrillation with ventricular rate of 108 beats per minute as well as left  anterior fascicular block, and no ischemic ST changes.  I have reviewed his previous echocardiogram done in December 2011, shows LVH, evidence of diastolic dysfunction, EF 55%, and mild left atrial enlargement.  IMPRESSION AND PLAN:  This is a patient with recurrent atrial fibrillation despite treatment with flecainide, diltiazem, and Toprol XL.  Initially symptomatic heart rates in 120s, currently with heart rate in 80s on 5 mg diltiazem infusion.  He is fairly asymptomatic with his current heart rate.  His blood pressures are in the 90 systolic. we will admit him for possible antiarrhythmic therapy initiation- consider amiodarone or Tikosyn treatment.  He has noted some increased abdominal girth and lower extremity edema; however, he does not have any frank heart failure symptoms.  He is almost a year since his last echocardiogram, so we will repeat this.  For now,  we will continue rate control with Toprol XL and diltiazem, andconsider increase or discontinuation of flecainide.  If blood pressure is an issue, we will consider adding digoxin.  He is currently anticoagulated on Coumadin, with an INR of 2.4.  We will continue his home regimen.  For his diabetes, we will hold his glipizide and metformin for now.  We will place him on sliding scale insulin and transition back.  We may make him n.p.o. later for possible cardioversion; however, he says he will not going to do this tomorrow because there is some question whether or not he could have this safely based on his primary care recommendations.  This is unclear what he is referring to, however.          ______________________________ Natasha Bence, MD     MH/MEDQ  D:  12/07/2010  T:  12/07/2010  Job:  [id number]  Electronically Signed by Natasha Bence MD on 12/21/2010 07:44:37 PM

## 2011-01-09 ENCOUNTER — Emergency Department (HOSPITAL_COMMUNITY)
Admission: EM | Admit: 2011-01-09 | Discharge: 2011-01-09 | Disposition: A | Discharge status: Home / Self Care | Payer: Medicare Other | Attending: Emergency Medicine | Admitting: Emergency Medicine

## 2011-01-09 DIAGNOSIS — Z7901 Long term (current) use of anticoagulants: Secondary | ICD-10-CM | POA: Insufficient documentation

## 2011-01-09 DIAGNOSIS — R3589 Other polyuria: Secondary | ICD-10-CM | POA: Insufficient documentation

## 2011-01-09 DIAGNOSIS — H538 Other visual disturbances: Secondary | ICD-10-CM | POA: Insufficient documentation

## 2011-01-09 DIAGNOSIS — Z79899 Other long term (current) drug therapy: Secondary | ICD-10-CM | POA: Insufficient documentation

## 2011-01-09 DIAGNOSIS — I2510 Atherosclerotic heart disease of native coronary artery without angina pectoris: Secondary | ICD-10-CM | POA: Insufficient documentation

## 2011-01-09 DIAGNOSIS — I4891 Unspecified atrial fibrillation: Secondary | ICD-10-CM | POA: Insufficient documentation

## 2011-01-09 DIAGNOSIS — E789 Disorder of lipoprotein metabolism, unspecified: Secondary | ICD-10-CM | POA: Insufficient documentation

## 2011-01-09 DIAGNOSIS — E119 Type 2 diabetes mellitus without complications: Secondary | ICD-10-CM | POA: Insufficient documentation

## 2011-01-09 DIAGNOSIS — R358 Other polyuria: Secondary | ICD-10-CM | POA: Insufficient documentation

## 2011-01-09 DIAGNOSIS — Z794 Long term (current) use of insulin: Secondary | ICD-10-CM | POA: Insufficient documentation

## 2011-01-09 DIAGNOSIS — R631 Polydipsia: Secondary | ICD-10-CM | POA: Insufficient documentation

## 2011-01-09 LAB — DIFFERENTIAL
Basophils Absolute: 0.1 10*3/uL (ref 0.0–0.1)
Eosinophils Absolute: 0.3 10*3/uL (ref 0.0–0.7)
Eosinophils Relative: 4 % (ref 0–5)
Lymphs Abs: 3 10*3/uL (ref 0.7–4.0)
Monocytes Absolute: 0.9 10*3/uL (ref 0.1–1.0)

## 2011-01-09 LAB — POCT I-STAT, CHEM 8
BUN: 12 mg/dL (ref 6–23)
Calcium, Ion: 1.16 mmol/L (ref 1.12–1.32)
Chloride: 104 mEq/L (ref 96–112)
Creatinine, Ser: 1.1 mg/dL (ref 0.50–1.35)
Glucose, Bld: 158 mg/dL — ABNORMAL HIGH (ref 70–99)
HCT: 42 % (ref 39.0–52.0)
Hemoglobin: 14.3 g/dL (ref 13.0–17.0)
Potassium: 3.9 mEq/L (ref 3.5–5.1)
Sodium: 140 mEq/L (ref 135–145)
TCO2: 25 mmol/L (ref 0–100)

## 2011-01-09 LAB — PROTIME-INR
INR: 1.91 — ABNORMAL HIGH (ref 0.00–1.49)
Prothrombin Time: 22.2 seconds — ABNORMAL HIGH (ref 11.6–15.2)

## 2011-01-09 LAB — URINALYSIS, ROUTINE W REFLEX MICROSCOPIC
Nitrite: NEGATIVE
Specific Gravity, Urine: 1.007 (ref 1.005–1.030)
Urobilinogen, UA: 1 mg/dL (ref 0.0–1.0)
pH: 6 (ref 5.0–8.0)

## 2011-01-09 LAB — COMPREHENSIVE METABOLIC PANEL
AST: 25 U/L (ref 0–37)
Albumin: 3.4 g/dL — ABNORMAL LOW (ref 3.5–5.2)
BUN: 12 mg/dL (ref 6–23)
Chloride: 102 mEq/L (ref 96–112)
Creatinine, Ser: 1.01 mg/dL (ref 0.50–1.35)
Total Bilirubin: 0.2 mg/dL — ABNORMAL LOW (ref 0.3–1.2)
Total Protein: 7.3 g/dL (ref 6.0–8.3)

## 2011-01-09 LAB — CBC
MCHC: 32 g/dL (ref 30.0–36.0)
MCV: 77.2 fL — ABNORMAL LOW (ref 78.0–100.0)
Platelets: 278 10*3/uL (ref 150–400)
RDW: 16.6 % — ABNORMAL HIGH (ref 11.5–15.5)
WBC: 9 10*3/uL (ref 4.0–10.5)

## 2011-01-09 LAB — GLUCOSE, CAPILLARY
Glucose-Capillary: 202 mg/dL — ABNORMAL HIGH (ref 70–99)
Glucose-Capillary: 141 mg/dL — ABNORMAL HIGH (ref 70–99)
Glucose-Capillary: 194 mg/dL — ABNORMAL HIGH (ref 70–99)

## 2011-01-09 LAB — URINE MICROSCOPIC-ADD ON

## 2011-01-17 ENCOUNTER — Encounter: Payer: Self-pay | Admitting: *Deleted

## 2011-01-20 ENCOUNTER — Ambulatory Visit: Payer: Medicare Other | Admitting: Internal Medicine

## 2011-01-21 ENCOUNTER — Emergency Department (HOSPITAL_COMMUNITY)
Admission: EM | Admit: 2011-01-21 | Discharge: 2011-01-21 | Disposition: A | Discharge status: Home / Self Care | Payer: Medicare Other | Attending: Emergency Medicine | Admitting: Emergency Medicine

## 2011-01-21 ENCOUNTER — Emergency Department (HOSPITAL_COMMUNITY): Payer: Medicare Other

## 2011-01-21 DIAGNOSIS — E7800 Pure hypercholesterolemia, unspecified: Secondary | ICD-10-CM | POA: Insufficient documentation

## 2011-01-21 DIAGNOSIS — Z7901 Long term (current) use of anticoagulants: Secondary | ICD-10-CM | POA: Insufficient documentation

## 2011-01-21 DIAGNOSIS — I2510 Atherosclerotic heart disease of native coronary artery without angina pectoris: Secondary | ICD-10-CM | POA: Insufficient documentation

## 2011-01-21 DIAGNOSIS — R059 Cough, unspecified: Secondary | ICD-10-CM | POA: Insufficient documentation

## 2011-01-21 DIAGNOSIS — I4891 Unspecified atrial fibrillation: Secondary | ICD-10-CM | POA: Insufficient documentation

## 2011-01-21 DIAGNOSIS — E119 Type 2 diabetes mellitus without complications: Secondary | ICD-10-CM | POA: Insufficient documentation

## 2011-01-21 DIAGNOSIS — M542 Cervicalgia: Secondary | ICD-10-CM | POA: Insufficient documentation

## 2011-01-21 DIAGNOSIS — R5381 Other malaise: Secondary | ICD-10-CM | POA: Insufficient documentation

## 2011-01-21 DIAGNOSIS — Z794 Long term (current) use of insulin: Secondary | ICD-10-CM | POA: Insufficient documentation

## 2011-01-21 DIAGNOSIS — Z7982 Long term (current) use of aspirin: Secondary | ICD-10-CM | POA: Insufficient documentation

## 2011-01-21 DIAGNOSIS — R071 Chest pain on breathing: Secondary | ICD-10-CM | POA: Insufficient documentation

## 2011-01-21 DIAGNOSIS — J42 Unspecified chronic bronchitis: Secondary | ICD-10-CM | POA: Insufficient documentation

## 2011-01-21 DIAGNOSIS — R05 Cough: Secondary | ICD-10-CM | POA: Insufficient documentation

## 2011-01-21 LAB — COMPREHENSIVE METABOLIC PANEL
AST: 21 U/L (ref 0–37)
BUN: 16 mg/dL (ref 6–23)
CO2: 27 mEq/L (ref 19–32)
Chloride: 96 mEq/L (ref 96–112)
Creatinine, Ser: 1.06 mg/dL (ref 0.50–1.35)
GFR calc Af Amer: 80 mL/min — ABNORMAL LOW (ref >90)
GFR calc non Af Amer: 69 mL/min — ABNORMAL LOW (ref >90)
Glucose, Bld: 217 mg/dL — ABNORMAL HIGH (ref 70–99)
Total Bilirubin: 0.2 mg/dL — ABNORMAL LOW (ref 0.3–1.2)

## 2011-01-21 LAB — DIFFERENTIAL
Lymphocytes Relative: 34 % (ref 12–46)
Lymphs Abs: 3.2 10*3/uL (ref 0.7–4.0)
Monocytes Relative: 10 % (ref 3–12)
Neutro Abs: 5 10*3/uL (ref 1.7–7.7)
Neutrophils Relative %: 52 % (ref 43–77)

## 2011-01-21 LAB — CBC
HCT: 41.5 % (ref 39.0–52.0)
Hemoglobin: 12.9 g/dL — ABNORMAL LOW (ref 13.0–17.0)
MCV: 79.5 fL (ref 78.0–100.0)
Platelets: 290 10*3/uL (ref 150–400)
RBC: 5.22 MIL/uL (ref 4.22–5.81)
WBC: 9.6 10*3/uL (ref 4.0–10.5)

## 2011-01-21 LAB — PROTIME-INR: Prothrombin Time: 24.8 seconds — ABNORMAL HIGH (ref 11.6–15.2)

## 2011-01-21 LAB — GLUCOSE, CAPILLARY: Glucose-Capillary: 272 mg/dL — ABNORMAL HIGH (ref 70–99)

## 2011-01-31 ENCOUNTER — Encounter: Payer: Self-pay | Admitting: Internal Medicine

## 2011-04-09 ENCOUNTER — Encounter (HOSPITAL_COMMUNITY): Payer: Self-pay | Admitting: Emergency Medicine

## 2011-04-09 ENCOUNTER — Emergency Department (HOSPITAL_COMMUNITY)
Admission: EM | Admit: 2011-04-09 | Discharge: 2011-04-09 | Disposition: A | Discharge status: Home / Self Care | Payer: Medicare Other | Attending: Emergency Medicine | Admitting: Emergency Medicine

## 2011-04-09 ENCOUNTER — Emergency Department (HOSPITAL_COMMUNITY): Payer: Medicare Other

## 2011-04-09 DIAGNOSIS — Z7901 Long term (current) use of anticoagulants: Secondary | ICD-10-CM | POA: Insufficient documentation

## 2011-04-09 DIAGNOSIS — E119 Type 2 diabetes mellitus without complications: Secondary | ICD-10-CM | POA: Insufficient documentation

## 2011-04-09 DIAGNOSIS — R109 Unspecified abdominal pain: Secondary | ICD-10-CM | POA: Insufficient documentation

## 2011-04-09 DIAGNOSIS — I509 Heart failure, unspecified: Secondary | ICD-10-CM | POA: Insufficient documentation

## 2011-04-09 DIAGNOSIS — I252 Old myocardial infarction: Secondary | ICD-10-CM | POA: Insufficient documentation

## 2011-04-09 DIAGNOSIS — K5730 Diverticulosis of large intestine without perforation or abscess without bleeding: Secondary | ICD-10-CM | POA: Insufficient documentation

## 2011-04-09 DIAGNOSIS — R141 Gas pain: Secondary | ICD-10-CM | POA: Insufficient documentation

## 2011-04-09 DIAGNOSIS — I10 Essential (primary) hypertension: Secondary | ICD-10-CM | POA: Insufficient documentation

## 2011-04-09 DIAGNOSIS — Z86718 Personal history of other venous thrombosis and embolism: Secondary | ICD-10-CM | POA: Insufficient documentation

## 2011-04-09 DIAGNOSIS — K625 Hemorrhage of anus and rectum: Secondary | ICD-10-CM | POA: Insufficient documentation

## 2011-04-09 DIAGNOSIS — Z7982 Long term (current) use of aspirin: Secondary | ICD-10-CM | POA: Insufficient documentation

## 2011-04-09 DIAGNOSIS — E669 Obesity, unspecified: Secondary | ICD-10-CM | POA: Insufficient documentation

## 2011-04-09 DIAGNOSIS — R142 Eructation: Secondary | ICD-10-CM | POA: Insufficient documentation

## 2011-04-09 DIAGNOSIS — I5032 Chronic diastolic (congestive) heart failure: Secondary | ICD-10-CM | POA: Insufficient documentation

## 2011-04-09 DIAGNOSIS — I4891 Unspecified atrial fibrillation: Secondary | ICD-10-CM | POA: Insufficient documentation

## 2011-04-09 DIAGNOSIS — E785 Hyperlipidemia, unspecified: Secondary | ICD-10-CM | POA: Insufficient documentation

## 2011-04-09 DIAGNOSIS — K644 Residual hemorrhoidal skin tags: Secondary | ICD-10-CM | POA: Insufficient documentation

## 2011-04-09 DIAGNOSIS — I2510 Atherosclerotic heart disease of native coronary artery without angina pectoris: Secondary | ICD-10-CM | POA: Insufficient documentation

## 2011-04-09 DIAGNOSIS — Z79899 Other long term (current) drug therapy: Secondary | ICD-10-CM | POA: Insufficient documentation

## 2011-04-09 DIAGNOSIS — K5790 Diverticulosis of intestine, part unspecified, without perforation or abscess without bleeding: Secondary | ICD-10-CM

## 2011-04-09 LAB — CBC
HCT: 39.8 % (ref 39.0–52.0)
MCV: 77.6 fL — ABNORMAL LOW (ref 78.0–100.0)
RBC: 5.13 MIL/uL (ref 4.22–5.81)
RDW: 16.5 % — ABNORMAL HIGH (ref 11.5–15.5)
WBC: 11 10*3/uL — ABNORMAL HIGH (ref 4.0–10.5)

## 2011-04-09 LAB — URINALYSIS, ROUTINE W REFLEX MICROSCOPIC
Nitrite: NEGATIVE
Specific Gravity, Urine: 1.018 (ref 1.005–1.030)
Urobilinogen, UA: 0.2 mg/dL (ref 0.0–1.0)

## 2011-04-09 LAB — COMPREHENSIVE METABOLIC PANEL
CO2: 22 mEq/L (ref 19–32)
Calcium: 8.9 mg/dL (ref 8.4–10.5)
Creatinine, Ser: 0.9 mg/dL (ref 0.50–1.35)
GFR calc Af Amer: >90 mL/min (ref >90)
GFR calc non Af Amer: 84 mL/min — ABNORMAL LOW (ref >90)
Glucose, Bld: 159 mg/dL — ABNORMAL HIGH (ref 70–99)

## 2011-04-09 LAB — DIFFERENTIAL
Eosinophils Relative: 2 % (ref 0–5)
Lymphocytes Relative: 26 % (ref 12–46)
Lymphs Abs: 2.9 10*3/uL (ref 0.7–4.0)
Monocytes Absolute: 1 10*3/uL (ref 0.1–1.0)

## 2011-04-09 LAB — PROTIME-INR
INR: 2.38 — ABNORMAL HIGH (ref 0.00–1.49)
Prothrombin Time: 26.4 seconds — ABNORMAL HIGH (ref 11.6–15.2)

## 2011-04-09 MED ORDER — IOHEXOL 300 MG/ML  SOLN
100.0000 mL | Freq: Once | INTRAMUSCULAR | Status: AC | PRN
  Administered 2011-04-09: 100 mL via INTRAVENOUS

## 2011-04-09 NOTE — ED Notes (Signed)
Pt is excited to go home.  Reviewed not to take his metformin for 48hrs.  Pt stated he understood.

## 2011-04-09 NOTE — ED Notes (Signed)
Patient presents with rectal pain and bleeding since this AM.  Patient denies abdominal and chest pain. Abdomen distended, non tender upon palpation, bowel sounds present upon auscultation, patient alert and orientedx3, resting quietly, no distress noted.

## 2011-04-09 NOTE — ED Provider Notes (Signed)
History     CSN: [id number]  Arrival date & time 04/09/11  1505   First MD Initiated Contact with Patient 04/09/11 1618      Chief Complaint  Patient presents with  . Hemorrhoids    (Consider location/radiation/quality/duration/timing/severity/associated sxs/prior treatment) HPI Comments: Patient is a 71 year old man who says he's had rectal bleeding for 3 weeks. He says he's had hemorrhoids in the past and has had minimal bleeding. However more recently he's been seeing dripping of blood from the rectum. It is noteworthy that he is on Coumadin as treatment for atrial fibrillation. He had mild lower abdominal pain yesterday, for which he took Pepto-Bismol. He is not currently in pain. Where he had recurrent bouts of rectal bleeding, he sought evaluation.  Patient is a 71 y.o. male presenting with hematochezia. The history is provided by the patient and medical records. No language interpreter was used.  Rectal Bleeding  The current episode started more than 2 weeks ago. The problem occurs occasionally. The problem has been gradually worsening. The pain is mild. The stool is described as mixed with blood. There was no prior successful therapy (He had tried an over-the-counter hemorrhoidal cream without relief.). There was no prior unsuccessful therapy. Associated symptoms include abdominal pain and hemorrhoids. Pertinent negatives include no anorexia, no fever, no diarrhea and no rectal pain. He has been eating and drinking normally. Urine output has been normal. His past medical history is significant for abdominal surgery. There were no sick contacts. Recently, medical care has been given by a specialist and at this facility (He had been hospitalized in September 2012 for rapid atrial fibrillation. He was cardioverted at that time.).    Past Medical History  Diagnosis Date  . Cholecystitis 04/22/2010    acute s/p cholecystectomy  . Gallstone pancreatitis 04/22/10  . Atrial flutter     s/p  CTI ablation by [facility] 08/05/10  . Diabetes mellitus type II   . Hyperlipidemia   . HTN (hypertension)   . Diastolic CHF, chronic     preserved EF  . Atrial fibrillation     with rapid ventricular response status post direct current cardioversion on December 07, 2010, to normal sinus rhythm  . Tobacco abuse   . Cervical radiculopathy   . Cervical radiculopathy   . Coronary artery disease     Est. EF of 55% -- History of nonobstructive coronary artery disease by cath in January 2012  . Heart palpitations   . History of MI (myocardial infarction)   . History of DVT (deep vein thrombosis)     Past Surgical History  Procedure Date  . Cholecystectomy 1/12  . Atrial ablation surgery 08/05/10    CTI ablation for atrial flutter by [facility]  . Cardioversion 12/07/2010     Successful direct current cardioversion with atrial fibrillation to normal sinus rhythm  . Cardiac catheterization   . Subtotal gastrectomy     Family History  Problem Relation Age of Onset  . Cancer Mother   . Heart attack Father   . Heart attack Brother     History  Substance Use Topics  . Smoking status: Current Everyday Smoker -- 1.0 packs/day for 50 years    Types: Cigarettes  . Smokeless tobacco: Never Used  . Alcohol Use: No      Review of Systems  Constitutional: Negative for fever.  HENT: Negative.   Eyes: Negative.   Respiratory: Negative.   Cardiovascular: Negative.   Gastrointestinal: Positive for abdominal pain, hematochezia, anal bleeding  and hemorrhoids. Negative for diarrhea, rectal pain and anorexia.  Genitourinary: Negative.   Musculoskeletal: Negative.   Skin: Negative.   Neurological: Negative.   Psychiatric/Behavioral: Negative.     Allergies  Review of patient's allergies indicates no known allergies.  Home Medications   Current Outpatient Rx  Name Route Sig Dispense Refill  . ASPIRIN EC 81 MG PO TBEC Oral Take 81 mg by mouth every morning.    [name] DILTIAZEM HCL ER COATED BEADS 240  MG PO CP24 Oral Take 240 mg by mouth every morning.    [name] FLECAINIDE ACETATE 150 MG PO TABS Oral Take 150 mg by mouth 2 (two) times daily.    . INSULIN GLARGINE 100 UNIT/ML [place] SOLN Subcutaneous Inject 26 Units into the skin at bedtime.    [name] LORATADINE 10 MG PO TABS Oral Take 10 mg by mouth every morning.     [name] METFORMIN HCL 1000 MG PO TABS Oral Take 1,000 mg by mouth 2 (two) times daily with a meal.     . METOPROLOL SUCCINATE ER 50 MG PO TB24 Oral Take 200 mg by mouth at bedtime.     [name] PRAVASTATIN SODIUM 20 MG PO TABS Oral Take 20 mg by mouth every morning.     . WARFARIN SODIUM 5 MG PO TABS Oral Take 5-7.5 mg by mouth at bedtime. 1.5 tablets on Sunday, Tuesday, and Thursday. 1 tablet on all other days    . NITROGLYCERIN 0.4 MG SL SUBL Sublingual Place 0.4 mg under the tongue every 5 (five) minutes x 3 doses as needed. For chest pain.      BP 124/61  Pulse 90  Temp(Src) 97.9 F (36.6 C) (Oral)  Resp 22  SpO2 96%  Physical Exam  Constitutional: He is oriented to person, place, and time.       Is an elderly man in no distress. He is moderately obese.  HENT:  Head: Normocephalic and atraumatic.  Right Ear: External ear normal.  Left Ear: External ear normal.  Mouth/Throat: Oropharynx is clear and moist.  Eyes: Conjunctivae and EOM are normal. Pupils are equal, round, and reactive to light.  Neck: Normal range of motion. Neck supple.  Cardiovascular: Normal rate, regular rhythm and normal heart sounds.   Pulmonary/Chest: Effort normal and breath sounds normal.  Abdominal: He exhibits distension. There is no tenderness.  Genitourinary:       Stool was Hemoccult positive. Rectal exam was done by Dr. Adriana Simas, who reported no mass or tenderness. He had a very small external hemorrhoids it was felt to be the cause of bleeding.  Musculoskeletal: Normal range of motion.  Neurological: He is oriented to person, place, and time.       No sensory or motor deficit.  Skin: Skin is warm and dry.    Psychiatric: He has a normal mood and affect. His behavior is normal.    ED Course  Procedures (including critical care time)   Labs Reviewed  OCCULT BLOOD, POC DEVICE  CBC  DIFFERENTIAL  COMPREHENSIVE METABOLIC PANEL  URINALYSIS, ROUTINE W REFLEX MICROSCOPIC   5:50 PM Patient was seen, and had physical exam performed. Laboratory testing and CT of the abdomen and pelvis are pending.  7:54 PM Results for orders placed during the hospital encounter of 04/09/11  OCCULT BLOOD, POC DEVICE      Component Value Range   Fecal Occult Bld POSITIVE    CBC      Component Value Range   WBC 11.0 (*) 4.0 -  10.5 (K/uL)   RBC 5.13  4.22 - 5.81 (MIL/uL)   Hemoglobin 12.8 (*) 13.0 - 17.0 (g/dL)   HCT 16.1  09.6 - 04.5 (%)   MCV 77.6 (*) 78.0 - 100.0 (fL)   MCH 25.0 (*) 26.0 - 34.0 (pg)   MCHC 32.2  30.0 - 36.0 (g/dL)   RDW 40.9 (*) 81.1 - 15.5 (%)   Platelets 313  150 - 400 (K/uL)  DIFFERENTIAL      Component Value Range   Neutrophils Relative 63  43 - 77 (%)   Neutro Abs 6.9  1.7 - 7.7 (K/uL)   Lymphocytes Relative 26  12 - 46 (%)   Lymphs Abs 2.9  0.7 - 4.0 (K/uL)   Monocytes Relative 9  3 - 12 (%)   Monocytes Absolute 1.0  0.1 - 1.0 (K/uL)   Eosinophils Relative 2  0 - 5 (%)   Eosinophils Absolute 0.2  0.0 - 0.7 (K/uL)   Basophils Relative 1  0 - 1 (%)   Basophils Absolute 0.1  0.0 - 0.1 (K/uL)  COMPREHENSIVE METABOLIC PANEL      Component Value Range   Sodium 138  135 - 145 (mEq/L)   Potassium 4.1  3.5 - 5.1 (mEq/L)   Chloride 103  96 - 112 (mEq/L)   CO2 22  19 - 32 (mEq/L)   Glucose, Bld 159 (*) 70 - 99 (mg/dL)   BUN 14  6 - 23 (mg/dL)   Creatinine, Ser 9.14  0.50 - 1.35 (mg/dL)   Calcium 8.9  8.4 - 78.2 (mg/dL)   Total Protein 7.1  6.0 - 8.3 (g/dL)   Albumin 3.4 (*) 3.5 - 5.2 (g/dL)   AST 16  0 - 37 (U/L)   ALT 24  0 - 53 (U/L)   Alkaline Phosphatase 91  39 - 117 (U/L)   Total Bilirubin 0.2 (*) 0.3 - 1.2 (mg/dL)   GFR calc non Af Amer 84 (*) >90 (mL/min)   GFR calc  Af Amer >90  >90 (mL/min)  URINALYSIS, ROUTINE W REFLEX MICROSCOPIC      Component Value Range   Color, Urine YELLOW  YELLOW    APPearance CLEAR  CLEAR    Specific Gravity, Urine 1.018  1.005 - 1.030    pH 5.5  5.0 - 8.0    Glucose, UA NEGATIVE  NEGATIVE (mg/dL)   Hgb urine dipstick NEGATIVE  NEGATIVE    Bilirubin Urine NEGATIVE  NEGATIVE    Ketones, ur NEGATIVE  NEGATIVE (mg/dL)   Protein, ur NEGATIVE  NEGATIVE (mg/dL)   Urobilinogen, UA 0.2  0.0 - 1.0 (mg/dL)   Nitrite NEGATIVE  NEGATIVE    Leukocytes, UA NEGATIVE  NEGATIVE   PROTIME-INR      Component Value Range   Prothrombin Time 26.4 (*) 11.6 - 15.2 (seconds)   INR 2.38 (*) 0.00 - 1.49    No results found.  7:55 PM Review of her laboratory testing showed stool positive for blood. His CBC, chemistries, and urine were all negative. His INR is therapeutic at 2.38. He is waiting for his CT of the abdomen and pelvis.  8:31 PM CT of the abdomen and pelvis shows diverticulosis. Patient does not require admission. He should continue his Coumadin. I reassured him and released to home.  1. Rectal bleeding   2. Diverticulosis             Carleene Cooper III, MD 04/09/11 2035

## 2011-04-09 NOTE — ED Notes (Signed)
C/o hemorrhoids x 3 weeks.

## 2011-04-09 NOTE — ED Notes (Signed)
Patient notified of need to provide urine sample.

## 2011-04-13 ENCOUNTER — Encounter (HOSPITAL_COMMUNITY): Payer: Self-pay | Admitting: Emergency Medicine

## 2011-05-03 ENCOUNTER — Emergency Department (HOSPITAL_COMMUNITY)
Admission: EM | Admit: 2011-05-03 | Discharge: 2011-05-03 | Disposition: A | Discharge status: Home / Self Care | Payer: Medicare Other | Attending: Emergency Medicine | Admitting: Emergency Medicine

## 2011-05-03 ENCOUNTER — Emergency Department (HOSPITAL_COMMUNITY): Payer: Medicare Other

## 2011-05-03 ENCOUNTER — Encounter (HOSPITAL_COMMUNITY): Payer: Self-pay | Admitting: *Deleted

## 2011-05-03 DIAGNOSIS — R141 Gas pain: Secondary | ICD-10-CM | POA: Insufficient documentation

## 2011-05-03 DIAGNOSIS — I4891 Unspecified atrial fibrillation: Secondary | ICD-10-CM | POA: Insufficient documentation

## 2011-05-03 DIAGNOSIS — I252 Old myocardial infarction: Secondary | ICD-10-CM | POA: Insufficient documentation

## 2011-05-03 DIAGNOSIS — I2510 Atherosclerotic heart disease of native coronary artery without angina pectoris: Secondary | ICD-10-CM | POA: Insufficient documentation

## 2011-05-03 DIAGNOSIS — Z794 Long term (current) use of insulin: Secondary | ICD-10-CM | POA: Insufficient documentation

## 2011-05-03 DIAGNOSIS — I10 Essential (primary) hypertension: Secondary | ICD-10-CM | POA: Insufficient documentation

## 2011-05-03 DIAGNOSIS — E119 Type 2 diabetes mellitus without complications: Secondary | ICD-10-CM | POA: Insufficient documentation

## 2011-05-03 DIAGNOSIS — R05 Cough: Secondary | ICD-10-CM | POA: Insufficient documentation

## 2011-05-03 DIAGNOSIS — Z86718 Personal history of other venous thrombosis and embolism: Secondary | ICD-10-CM | POA: Insufficient documentation

## 2011-05-03 DIAGNOSIS — Z7982 Long term (current) use of aspirin: Secondary | ICD-10-CM | POA: Insufficient documentation

## 2011-05-03 DIAGNOSIS — Z79899 Other long term (current) drug therapy: Secondary | ICD-10-CM | POA: Insufficient documentation

## 2011-05-03 DIAGNOSIS — R143 Flatulence: Secondary | ICD-10-CM | POA: Insufficient documentation

## 2011-05-03 DIAGNOSIS — E785 Hyperlipidemia, unspecified: Secondary | ICD-10-CM | POA: Insufficient documentation

## 2011-05-03 DIAGNOSIS — R5381 Other malaise: Secondary | ICD-10-CM | POA: Insufficient documentation

## 2011-05-03 DIAGNOSIS — R142 Eructation: Secondary | ICD-10-CM | POA: Insufficient documentation

## 2011-05-03 DIAGNOSIS — R739 Hyperglycemia, unspecified: Secondary | ICD-10-CM

## 2011-05-03 DIAGNOSIS — R059 Cough, unspecified: Secondary | ICD-10-CM | POA: Insufficient documentation

## 2011-05-03 LAB — URINALYSIS, ROUTINE W REFLEX MICROSCOPIC
Bilirubin Urine: NEGATIVE
Nitrite: NEGATIVE
Specific Gravity, Urine: 1.025 (ref 1.005–1.030)
Urobilinogen, UA: 0.2 mg/dL (ref 0.0–1.0)

## 2011-05-03 LAB — CBC
Hemoglobin: 11.7 g/dL — ABNORMAL LOW (ref 13.0–17.0)
MCH: 24.4 pg — ABNORMAL LOW (ref 26.0–34.0)
MCHC: 31.7 g/dL (ref 30.0–36.0)
Platelets: 321 10*3/uL (ref 150–400)

## 2011-05-03 LAB — DIFFERENTIAL
Basophils Relative: 1 % (ref 0–1)
Eosinophils Absolute: 0.2 10*3/uL (ref 0.0–0.7)
Eosinophils Relative: 2 % (ref 0–5)
Monocytes Relative: 8 % (ref 3–12)
Neutrophils Relative %: 61 % (ref 43–77)

## 2011-05-03 LAB — BASIC METABOLIC PANEL
BUN: 13 mg/dL (ref 6–23)
Calcium: 9.1 mg/dL (ref 8.4–10.5)
GFR calc Af Amer: 78 mL/min — ABNORMAL LOW (ref >90)
GFR calc non Af Amer: 67 mL/min — ABNORMAL LOW (ref >90)
Potassium: 4.2 mEq/L (ref 3.5–5.1)

## 2011-05-03 MED ORDER — BENZONATATE 100 MG PO CAPS: 100.0000 mg | ORAL_CAPSULE | Freq: Three times a day (TID) | ORAL | Status: AC

## 2011-05-03 NOTE — ED Notes (Signed)
CBG registered 184 on ED glucometer

## 2011-05-03 NOTE — ED Notes (Signed)
Per EMS- pt in c/o weakness and feels "out of place", states he CBG typically runs around low 100s, today CBD 226 and he has been unable to get it to lower. Pt alert and oriented, no other complaints.

## 2011-05-03 NOTE — ED Provider Notes (Signed)
History     CSN: [id number]  Arrival date & time 05/03/11  1317   First MD Initiated Contact with Patient 05/03/11 1324      Chief Complaint  Patient presents with  . Hyperglycemia    (Consider location/radiation/quality/duration/timing/severity/associated sxs/prior treatment) HPI  71yoM h/o IDDM, afib/flutter s/p ablation, DVT on coumadin pw hyperglycemia. She states that his blood glucoses been in the high 200s at home for the past 3 days. He says that typically it is in the low 100s.-Home has been 226. He states he has been taking his insulin. He states that he feels weak all over " like when my sugar is high". He denies chest pain, shortness of breath. He denies palpitations. He states he's been compliant with his Coumadin. He does state he's had a productive cough for the past 2-3 days. There is no shortness of breath associated with it. He denies headache, dizziness. He denies numbness, tingling, weakness of his extremities. Denies back pain. There's been no recent fall. Denies hematuria/dysuria/freq/urgency.    ED Notes, ED Provider Notes from 05/03/11 0000 to 05/03/11 13:23:39       Servando Snare Bivens, RN 05/03/2011 13:22      Per EMS- pt in c/o weakness and feels "out of place", states he CBG typically runs around low 100s, today CBD 226 and he has been unable to get it to lower. Pt alert and oriented, no other complaints.     Past Medical History  Diagnosis Date  . Cholecystitis 04/22/2010    acute s/p cholecystectomy  . Gallstone pancreatitis 04/22/10  . Atrial flutter     s/p CTI ablation by [facility] 08/05/10  . Diabetes mellitus type II   . Hyperlipidemia   . HTN (hypertension)   . Diastolic CHF, chronic     preserved EF  . Atrial fibrillation     with rapid ventricular response status post direct current cardioversion on December 07, 2010, to normal sinus rhythm  . Tobacco abuse   . Cervical radiculopathy   . Cervical radiculopathy   . Coronary artery disease     Est.  EF of 55% -- History of nonobstructive coronary artery disease by cath in January 2012  . Heart palpitations   . History of MI (myocardial infarction)   . History of DVT (deep vein thrombosis)     Past Surgical History  Procedure Date  . Cholecystectomy 1/12  . Atrial ablation surgery 08/05/10    CTI ablation for atrial flutter by [facility]  . Cardioversion 12/07/2010     Successful direct current cardioversion with atrial fibrillation to normal sinus rhythm  . Cardiac catheterization   . Subtotal gastrectomy     Family History  Problem Relation Age of Onset  . Cancer Mother   . Heart attack Father   . Heart attack Brother     History  Substance Use Topics  . Smoking status: Current Everyday Smoker -- 1.0 packs/day for 50 years    Types: Cigarettes  . Smokeless tobacco: Never Used  . Alcohol Use: No    Review of Systems  All other systems reviewed and are negative.   except as noted HPI   Allergies  Review of patient's allergies indicates no known allergies.  Home Medications   Current Outpatient Rx  Name Route Sig Dispense Refill  . ASPIRIN EC 81 MG PO TBEC Oral Take 81 mg by mouth every morning.    [name] DILTIAZEM HCL ER COATED BEADS 240 MG PO CP24 Oral Take 240  mg by mouth every morning.    [name] FLECAINIDE ACETATE 150 MG PO TABS Oral Take 150 mg by mouth 2 (two) times daily.    [name] HYDROCORTISONE 1 % EX CREA Topical Apply 1 application topically 2 (two) times daily.    . INSULIN GLARGINE 100 UNIT/ML [place] SOLN Subcutaneous Inject 30 Units into the skin at bedtime.     [name] LORATADINE 10 MG PO TABS Oral Take 10 mg by mouth every morning.     [name] METFORMIN HCL 1000 MG PO TABS Oral Take 1,000 mg by mouth 2 (two) times daily with a meal.     . METOPROLOL SUCCINATE ER 50 MG PO TB24 Oral Take 200 mg by mouth at bedtime.     [name] NITROGLYCERIN 0.4 MG SL SUBL Sublingual Place 0.4 mg under the tongue every 5 (five) minutes x 3 doses as needed. For chest pain.    [name] PRAVASTATIN SODIUM 20 MG PO  TABS Oral Take 20 mg by mouth every morning.     . TETRAHYDROZOLINE HCL 0.05 % OP SOLN Left Eye Place 1 drop into the left eye 2 (two) times daily.    . WARFARIN SODIUM 5 MG PO TABS Oral Take 5-7.5 mg by mouth at bedtime. 1.5 tablets on Sunday, Tuesday, and Thursday. 1 tablet on all other days    . BENZONATATE 100 MG PO CAPS Oral Take 1 capsule (100 mg total) by mouth every 8 (eight) hours. 21 capsule 0    BP 124/66  Pulse 62  Temp(Src) 98 F (36.7 C) (Oral)  Resp 20  SpO2 100%  Physical Exam  Nursing note and vitals reviewed. Constitutional: He is oriented to person, place, and time. He appears well-developed and well-nourished. No distress.  HENT:  Head: Atraumatic.  Mouth/Throat: Oropharynx is clear and moist.  Eyes: Conjunctivae are normal. Pupils are equal, round, and reactive to light.  Neck: Neck supple.  Cardiovascular: Normal rate, regular rhythm, normal heart sounds and intact distal pulses.  Exam reveals no gallop and no friction rub.   No murmur heard. Pulmonary/Chest: Effort normal. No respiratory distress. He has no wheezes. He has no rales.  Abdominal: Soft. Bowel sounds are normal. He exhibits distension. There is no tenderness. There is no rebound and no guarding.  Musculoskeletal: Normal range of motion. He exhibits no edema and no tenderness.  Neurological: He is alert and oriented to person, place, and time.  Skin: Skin is warm and dry. No rash noted.  Psychiatric: He has a normal mood and affect.    ED Course  Procedures (including critical care time)  Labs Reviewed  GLUCOSE, CAPILLARY - Abnormal; Notable for the following:    Glucose-Capillary 184 (*)    All other components within normal limits  CBC - Abnormal; Notable for the following:    Hemoglobin 11.7 (*)    HCT 36.9 (*)    MCV 76.9 (*)    MCH 24.4 (*)    RDW 16.2 (*)    All other components within normal limits  BASIC METABOLIC PANEL - Abnormal; Notable for the following:    Sodium 134 (*)      Glucose, Bld 177 (*)    GFR calc non Af Amer 67 (*)    GFR calc Af Amer 78 (*)    All other components within normal limits  DIFFERENTIAL  URINALYSIS, ROUTINE W REFLEX MICROSCOPIC   Dg Chest 2 View  05/03/2011  *RADIOLOGY REPORT*  Clinical Data: Hyperglycemia  CHEST - 2 VIEW  Comparison: 01/21/2011  Findings: Lungs are clear. No pleural effusion or pneumothorax.  Cardiomediastinal silhouette is within normal limits.  Degenerative changes of the visualized thoracolumbar spine.  Surgical clips at the GE junction.  IMPRESSION: No evidence of acute cardiopulmonary disease.  Original Report Authenticated By: Charline Bills, M.D.   1. Hyperglycemia   2. Cough     MDM  Patient presents with hyperglycemia here. His point-of-care glucose is 184 and it is 177 on his formal BMP.  No evidence for infection. He has a cough but a negative chest x-ray. He appears quite well. There was no indication to check his INR today. Rviewed chest x-ray, labs with the patient. He is comfortable with discharge home. Will prescribe Tessalon for cough. He is to follow with his primary care doctor in the next 2 days. Continue to take his insulin, metformin as prescribed.    BP 124/66  Pulse 62  Temp(Src) 98 F (36.7 C) (Oral)  Resp 20  SpO2 100%     Forbes Cellar, MD 05/03/11 1609

## 2011-05-03 NOTE — ED Notes (Signed)
EVO:[id number]<XF> Expected date:05/03/11<BR> Expected time: 1:10 PM<BR> Means of arrival:Ambulance<BR> Comments:<BR> EMS 35 Ptar - hyperglycemia

## 2011-06-19 IMAGING — CR DG CHEST 1V PORT
1 series · 1 of 1 positions shown · non-contrast
Comparison: Chest 03/15/2010.

CLINICAL DATA: Atrial fibrillation.

PORTABLE CHEST - 1 VIEW

[AP]
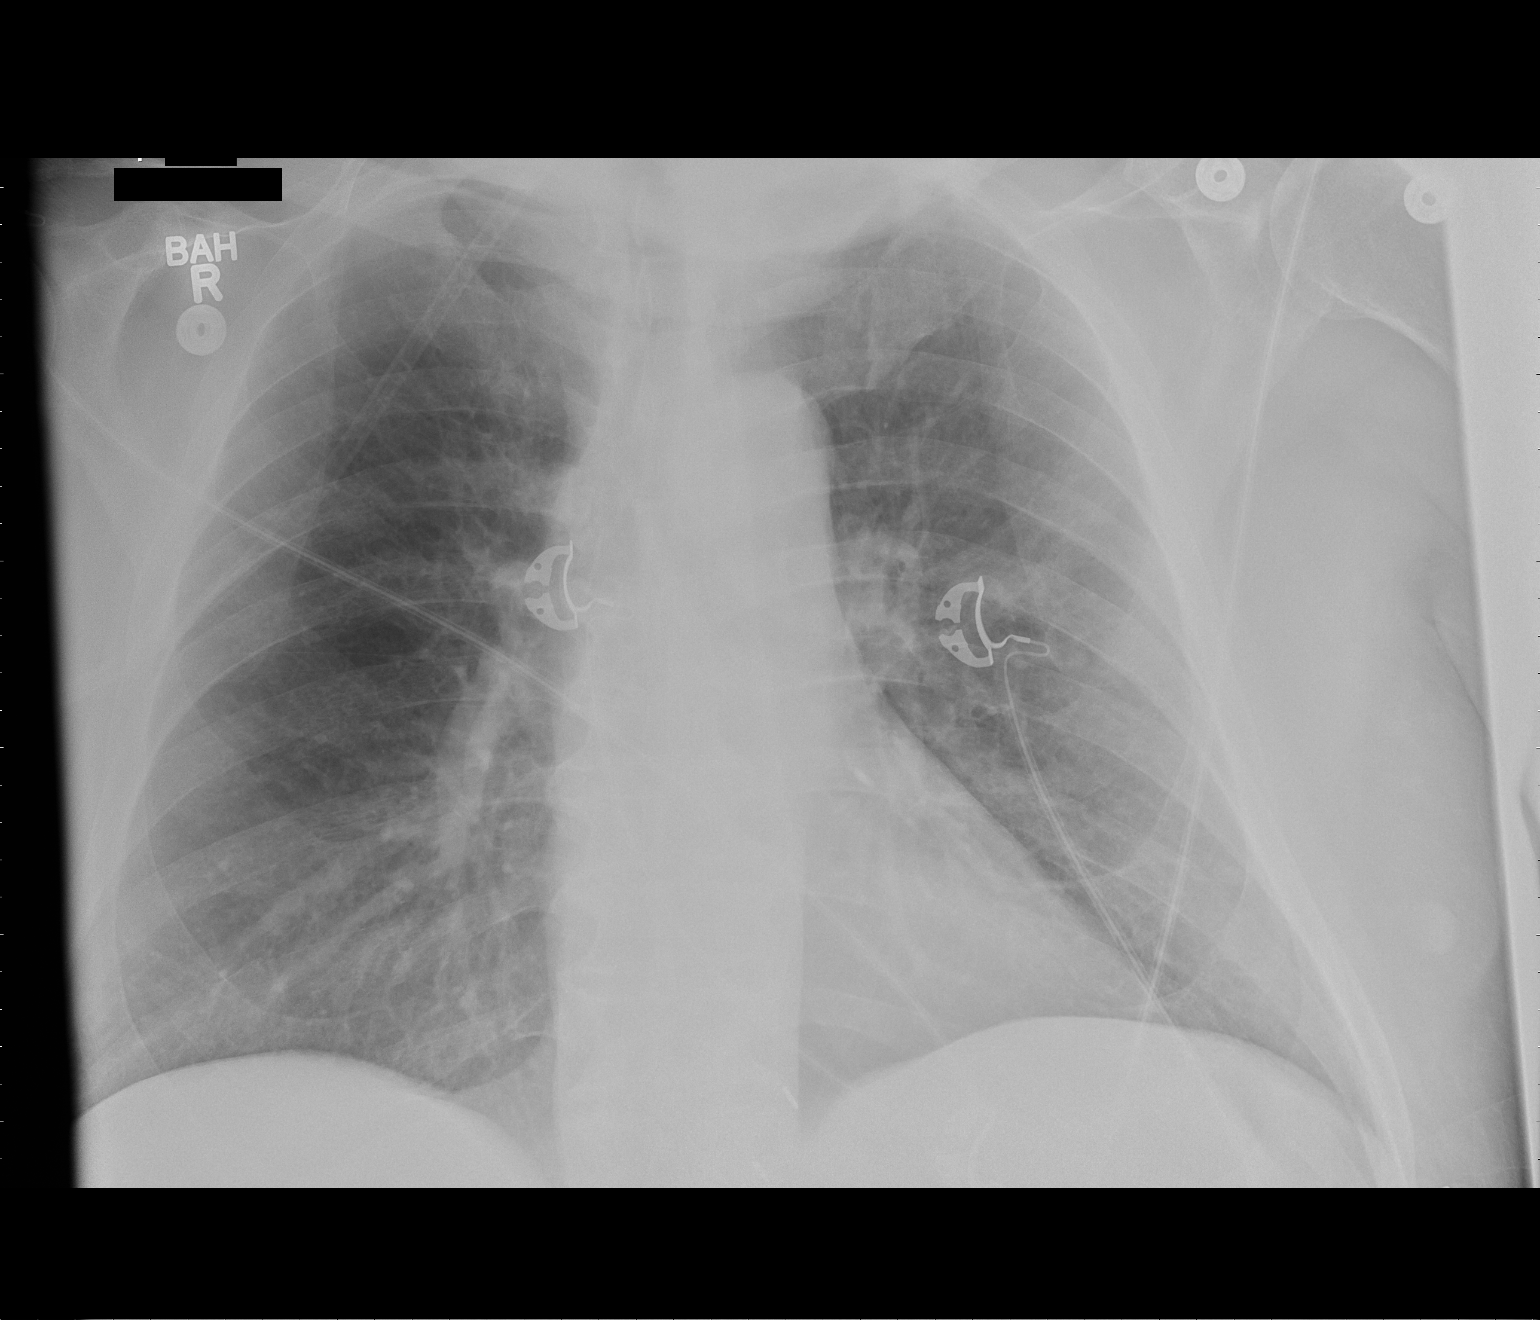

[1 of 1 positions shown; findings below may reference images not displayed]

FINDINGS: Lungs are clear.  Heart size is normal.  No pneumothorax
or pleural effusion.
IMPRESSION: Negative chest.

## 2011-06-25 IMAGING — CT CT ABD-PELV W/ CM
2 of 5 series · 14 of 32 positions shown, 19 images · IV contrast (agent unspecified)
Comparison: None

CLINICAL DATA: Upper abdominal pain.

CT ABDOMEN AND PELVIS WITH CONTRAST
TECHNIQUE: Multidetector CT imaging of the abdomen and pelvis was
performed following the standard protocol during bolus
administration of intravenous contrast.
Contrast: 100 ml [id number] IV

[Series 2: routine abdomen · axial · 0.88mm/px · z∈[-454,-99]mm · 6 of 101 slices shown, 11 images]
[im 15/101  soft-tissue]
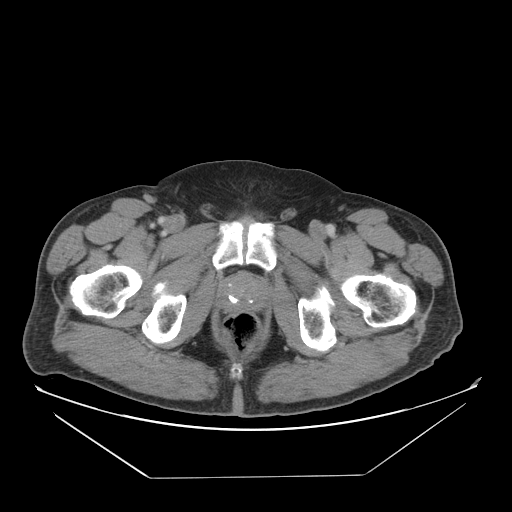
[im 15/101  bone]
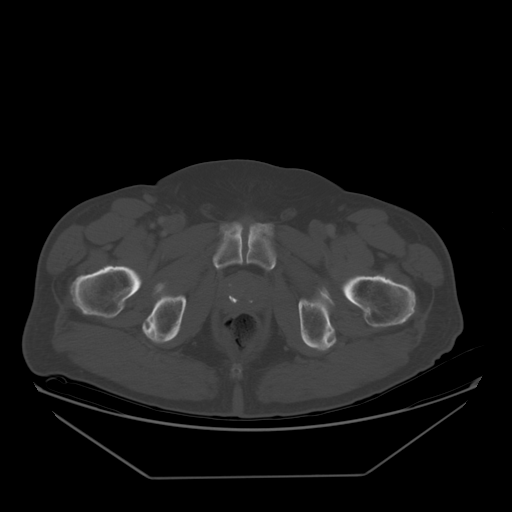
[im 29/101  soft-tissue]
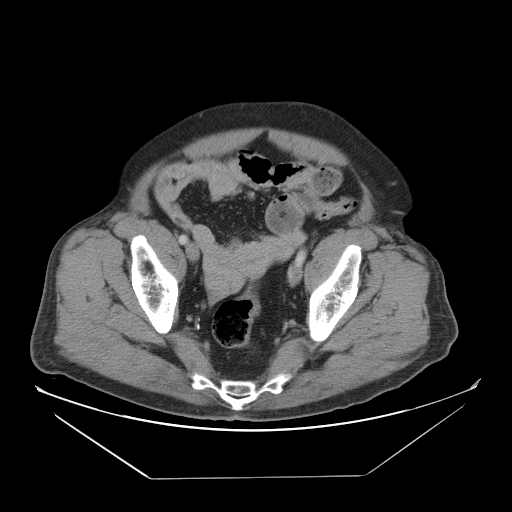
[im 43/101  soft-tissue]
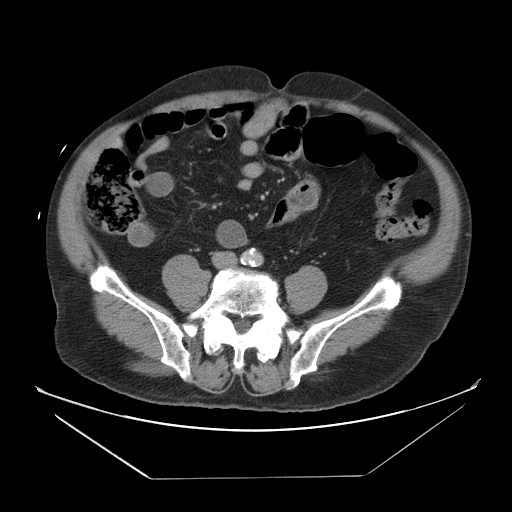
[im 43/101  lung]
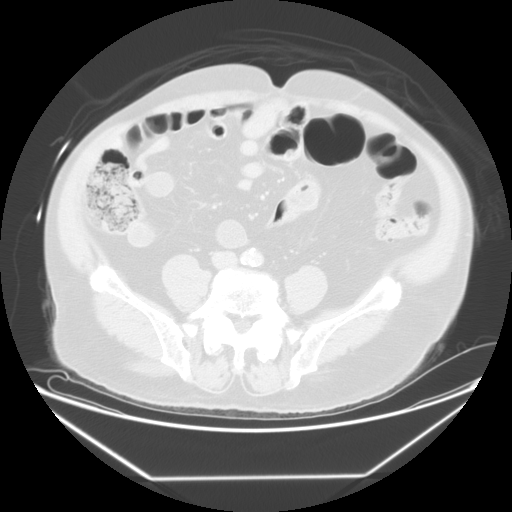
[im 58/101  soft-tissue]
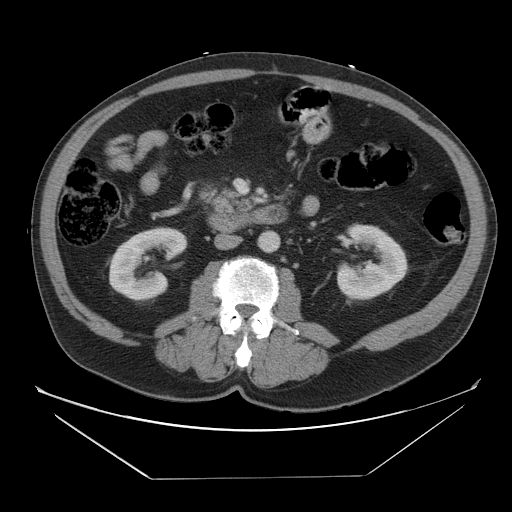
[im 58/101  lung]
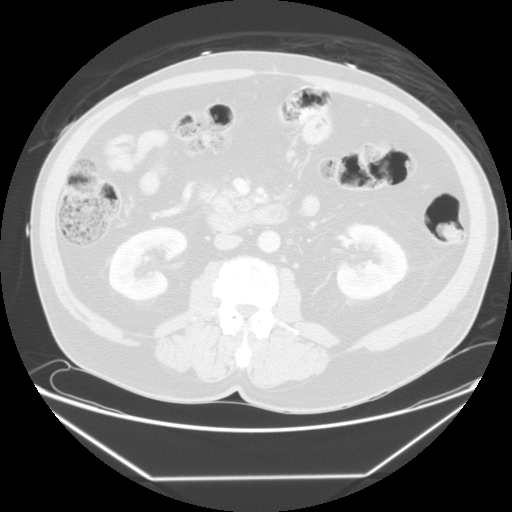
[im 72/101  soft-tissue]
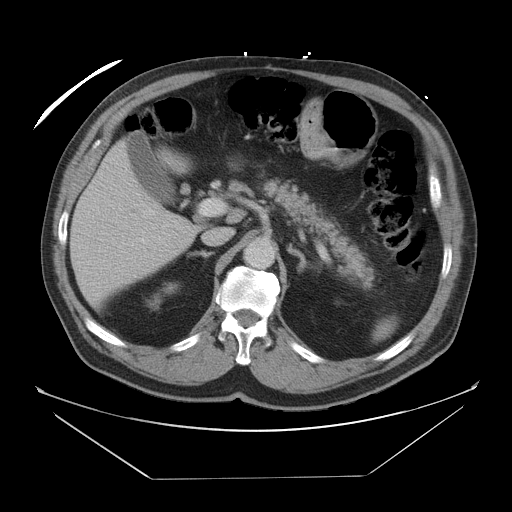
[im 72/101  lung]
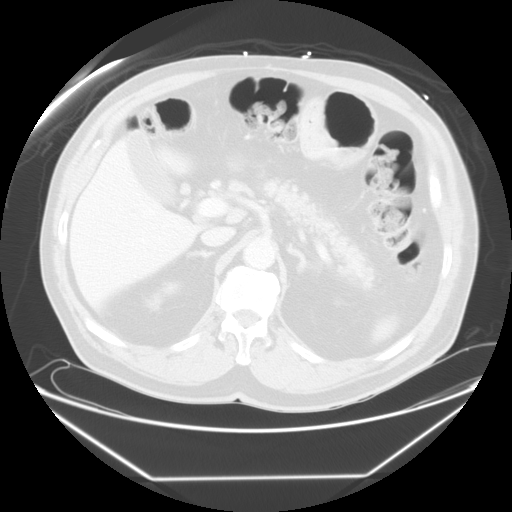
[im 86/101  soft-tissue]
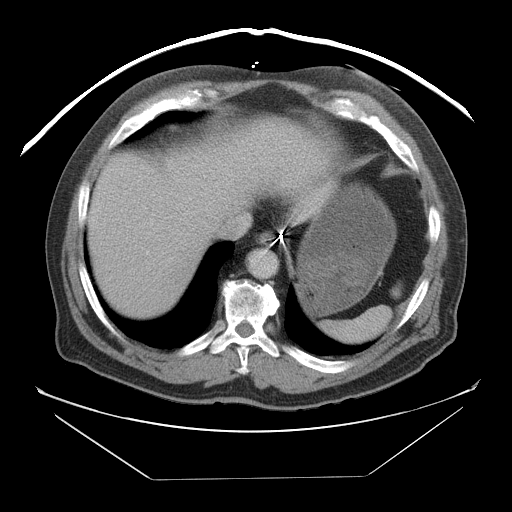
[im 86/101  lung]
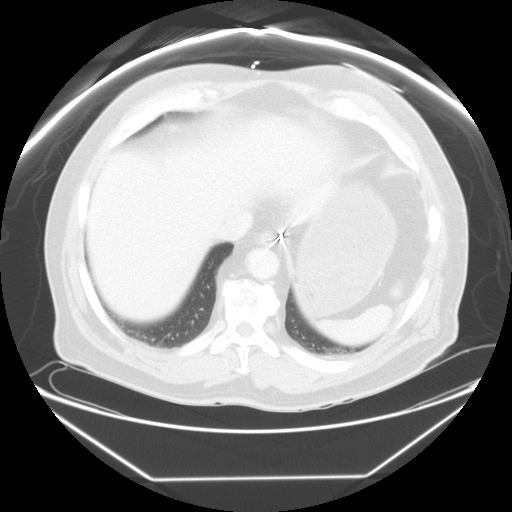

[Series 400: sag · sagittal · 1.00mm/px · 8 of 145 slices shown]
[im 15/145  soft-tissue]
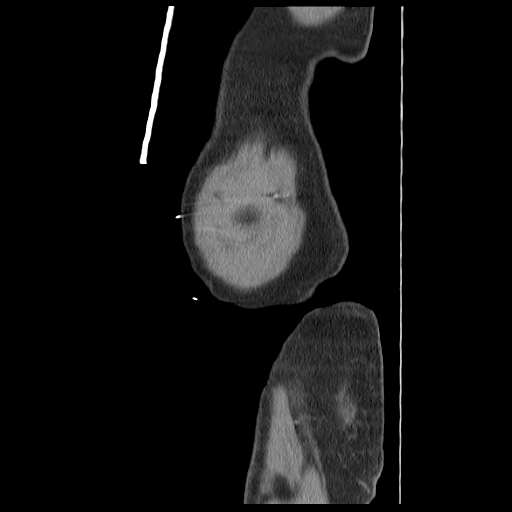
[im 29/145  soft-tissue]
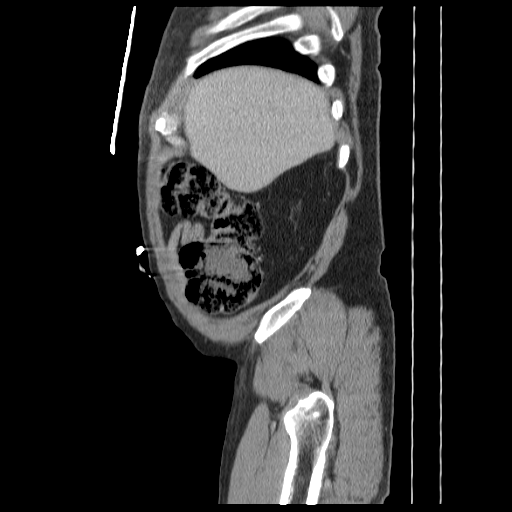
[im 44/145  soft-tissue]
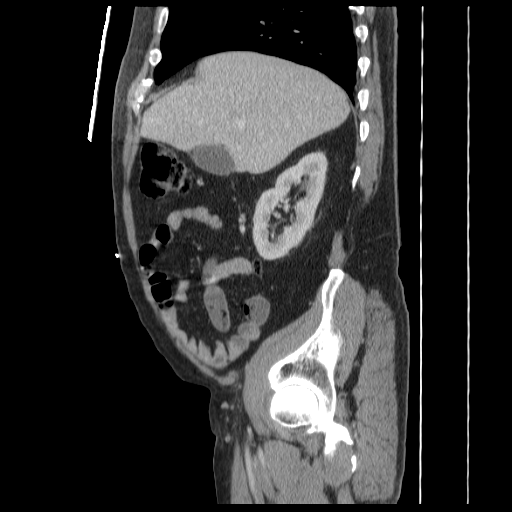
[im 58/145  soft-tissue]
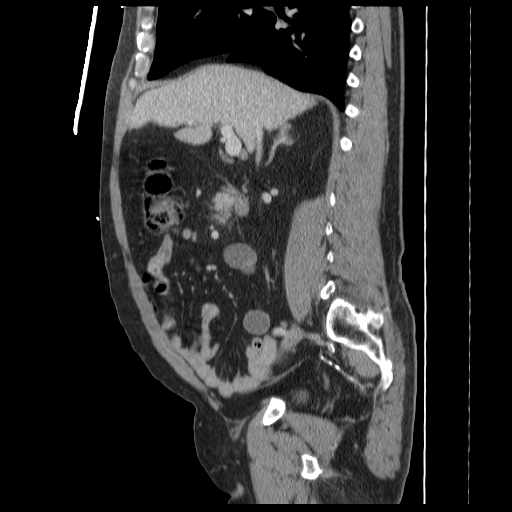
[im 87/145  soft-tissue]
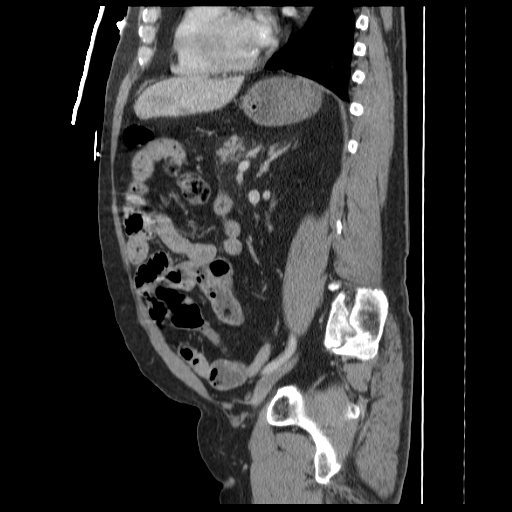
[im 101/145  soft-tissue]
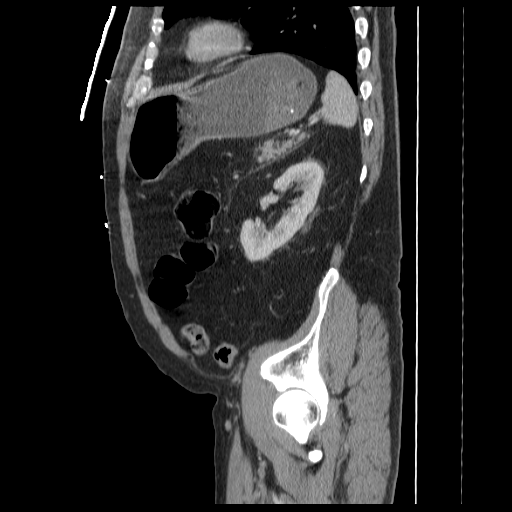
[im 116/145  soft-tissue]
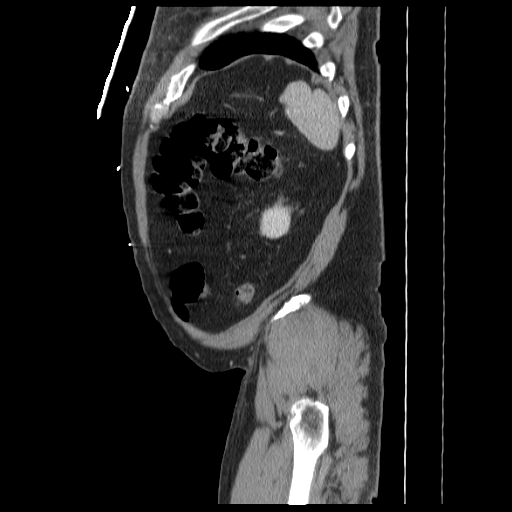
[im 130/145  soft-tissue]
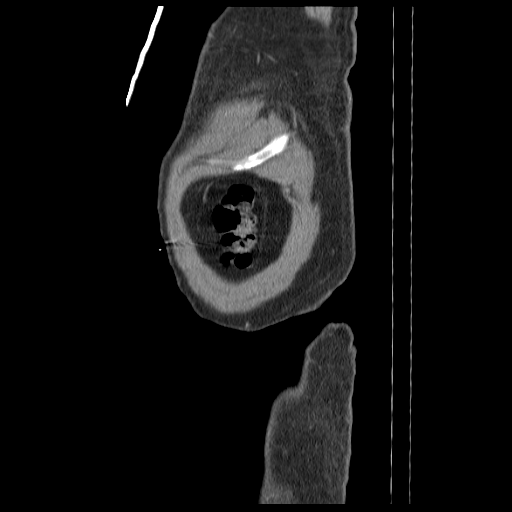

[14 of 32 positions shown; findings below may reference images not displayed]

FINDINGS: There are mild inflammatory changes surrounding the body
and tail of the pancreas and consistent with acute pancreatitis.
No evidence of focal abscess or pancreatic necrosis.  No focal
pancreatic mass is identified.

There is suggestion of potentially a few subtle gallstones in the
gallbladder.  No evidence of cholecystitis or biliary dilatation by
CT.  A few small lymph nodes are noted in the periportal region.

Bowel loops are unremarkable.  No evidence of hernia.  Bladder is
unremarkable.  The  liver, spleen and adrenal glands are
unremarkable.  There is a nonobstructing calculus in the lower pole
of the right kidney.  Bony structures are unremarkable.
IMPRESSION: Mild inflammatory changes surrounding the body and tail of the
pancreas and consistent with pancreatitis.  No complicating
features such as abscess or pancreatic necrosis identified.  There
may be some subtle gallstones in the gallbladder.

## 2011-06-26 IMAGING — US US ABDOMEN COMPLETE
1 series · 13 of 25 positions shown · non-contrast
Comparison: [HOSPITAL] abdominal CT 04/14/2010.

CLINICAL DATA: Acute pancreatitis.  Slight abdominal pain,
hypertension, diabetes.

COMPLETE ABDOMINAL ULTRASOUND

[Series 1: us abdomen complete · 0.35mm/px · 13 of 66 slices shown]
[im 1/66]
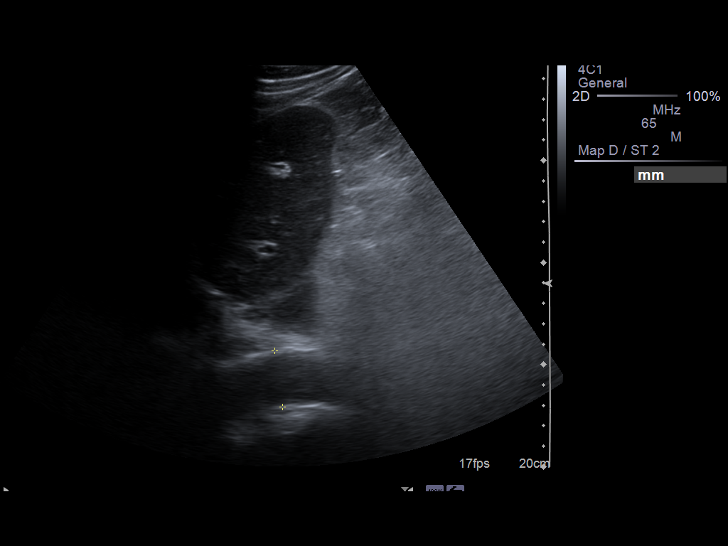
[im 6/66]
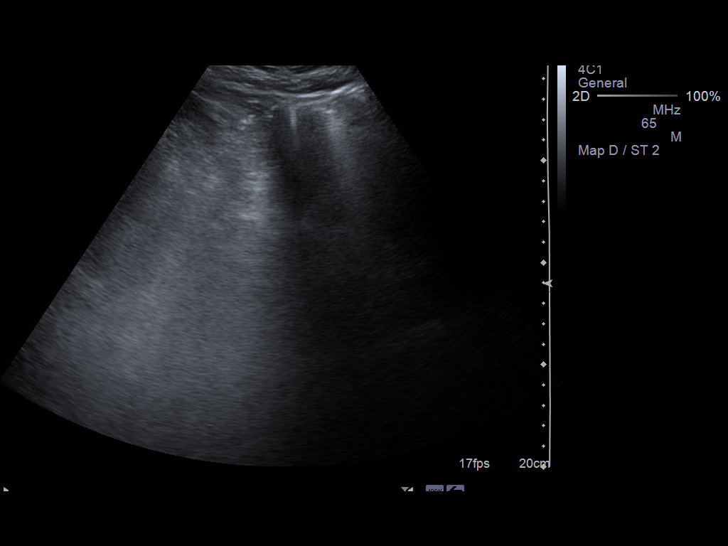
[im 11/66]
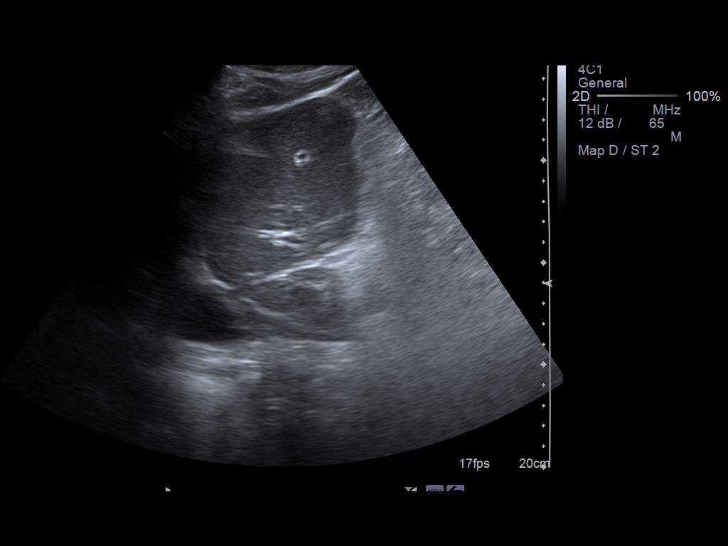
[im 17/66]
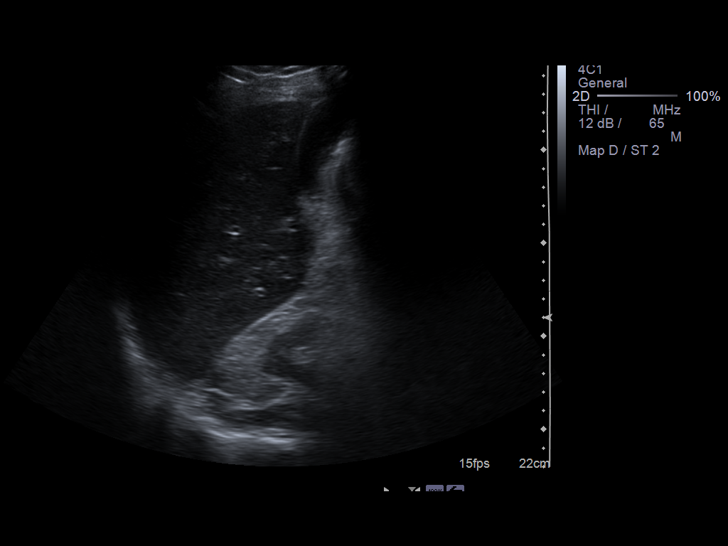
[im 22/66]
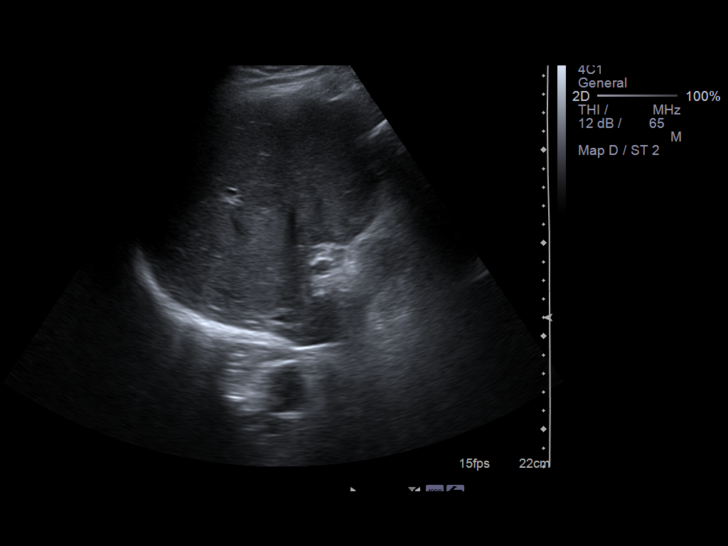
[im 28/66]
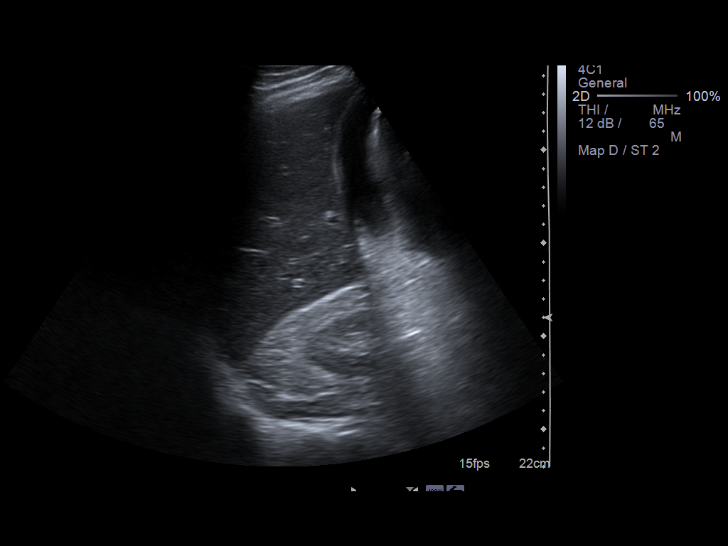
[im 33/66]
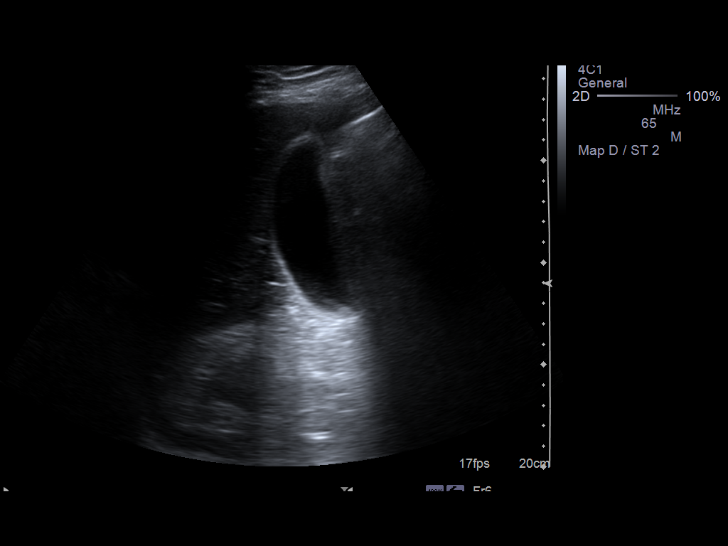
[im 38/66]
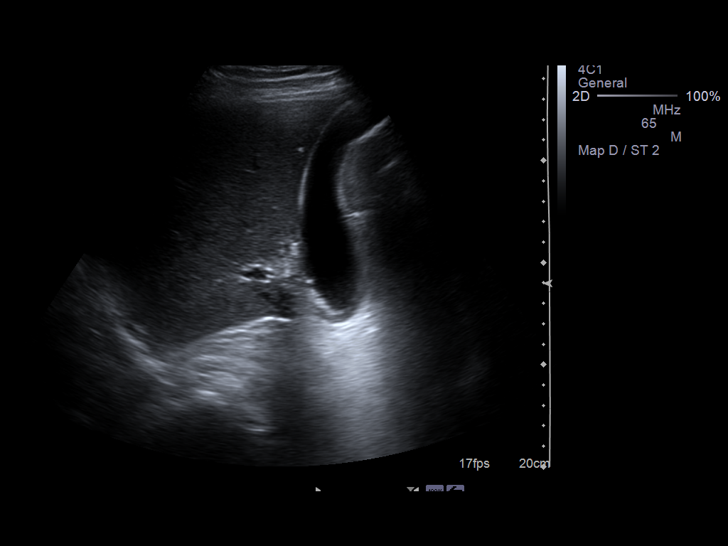
[im 44/66]
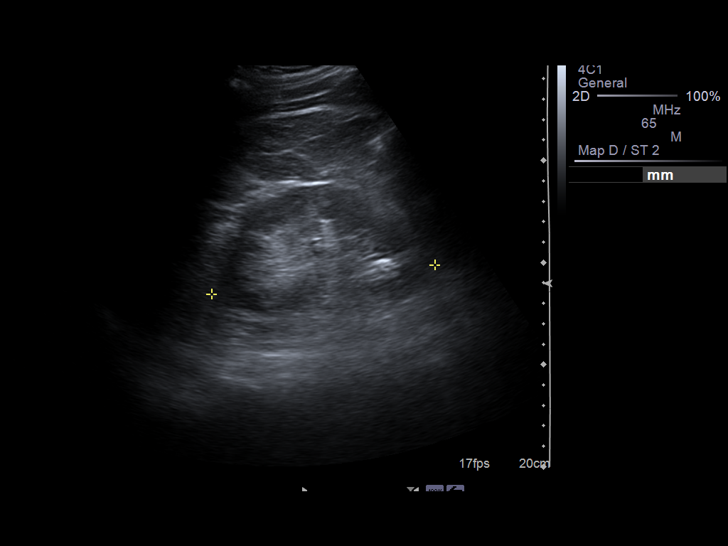
[im 49/66]
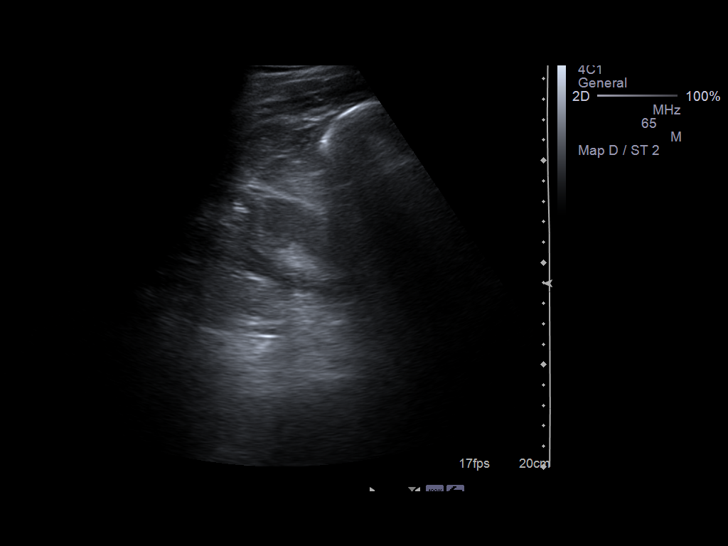
[im 55/66]
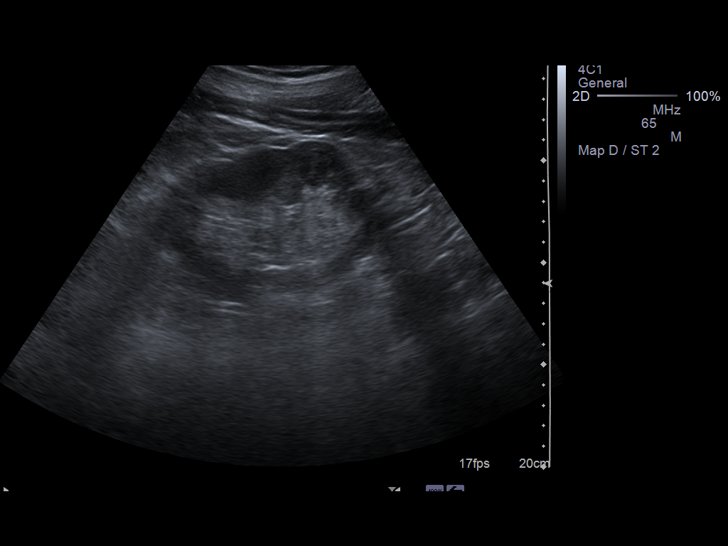
[im 60/66]
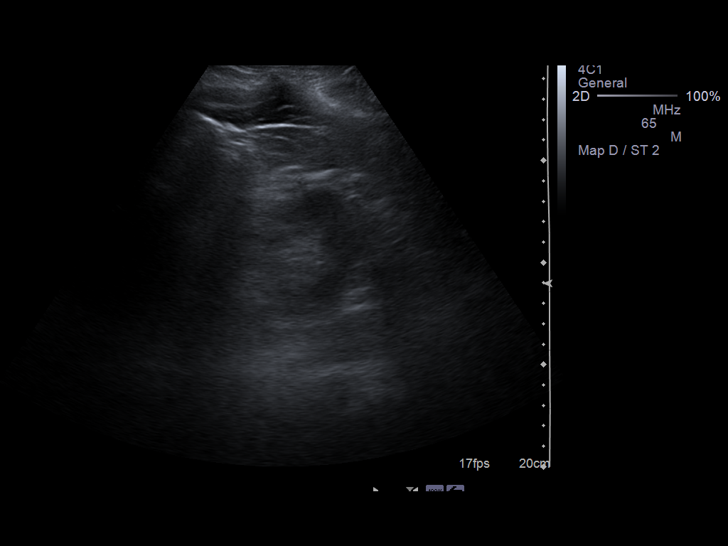
[im 66/66]
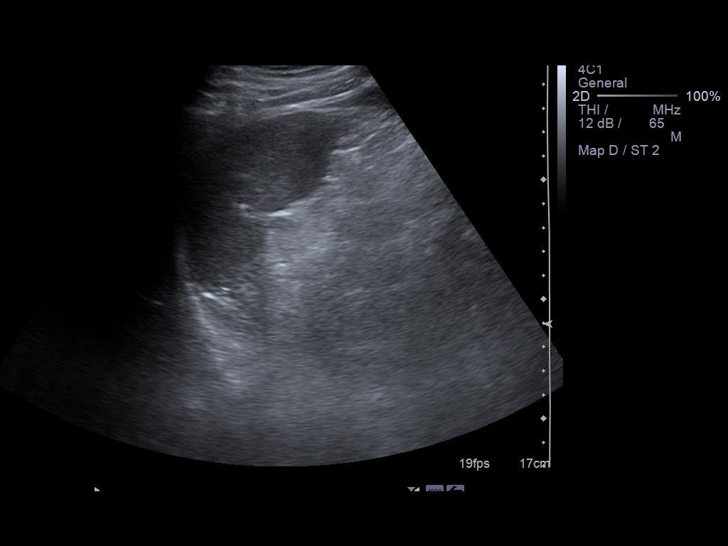

[13 of 25 positions shown; findings below may reference images not displayed]

FINDINGS: Gallbladder:  Slight low level gallsludge seen at neck similar to
CT finding without discrete gallstone.  Gallbladder wall thickness
is upper limits of normal at 3.9 mm with no sonographic Murphy's
sign evoked.

Common bile duct:  No dilated intrahepatic bile ducts with common
bile duct upper limits of normal for patient's age at 6 mm.

Liver:  Sonographically normal.

IVC:  Appears normal.

Pancreas:  Not visualized due to overlying gastrointestinal gas.

Spleen:  Sonographically normal measuring 6.6 cm long.

Right Kidney:  Sonographically normal measuring 11.0 cm long with a
CT manifest 9mm lower pole right renal calculus not visualized on
current ultrasound.

Left Kidney:  Sonographically normal measuring 12.1 cm long with
1cm lower pole left renal cortical cyst seen on CT not visualized
on current ultrasound.

Abdominal aorta:  No aneurysm identified with maximum proximal
diameter 2.8 cm.

No free fluid.
IMPRESSION: 1.  Nonvisualization pancreas.
2.  Gallbladder neck nonobstructing sludge.
3.  Nonvisualization of prior nonobstructing lower pole 9mm right
renal calculus and 1cm lower pole left renal cortical cyst seen on
CT.
4.  Otherwise, negative.

## 2011-06-27 IMAGING — NM NM HEPATOBILIARY IMAGE, INC GB
2 series · 7 of 7 positions shown · non-contrast
Comparison: CT abdomen 04/14/2010.

CLINICAL DATA: 70-year-old male with pancreatitis versus
cholecystitis.

NUCLEAR MEDICINE HEPATOBILIARY IMAGING
TECHNIQUE: Sequential images of the abdomen were obtained [DATE] minutes following intravenous administration of
radiopharmaceutical.
Radiopharmaceutical:  5.4 mCi Kc-SSm Choletec

[he hepatobiliary · 1 of 1 slices shown (1 of 2)]
[im 1/1]
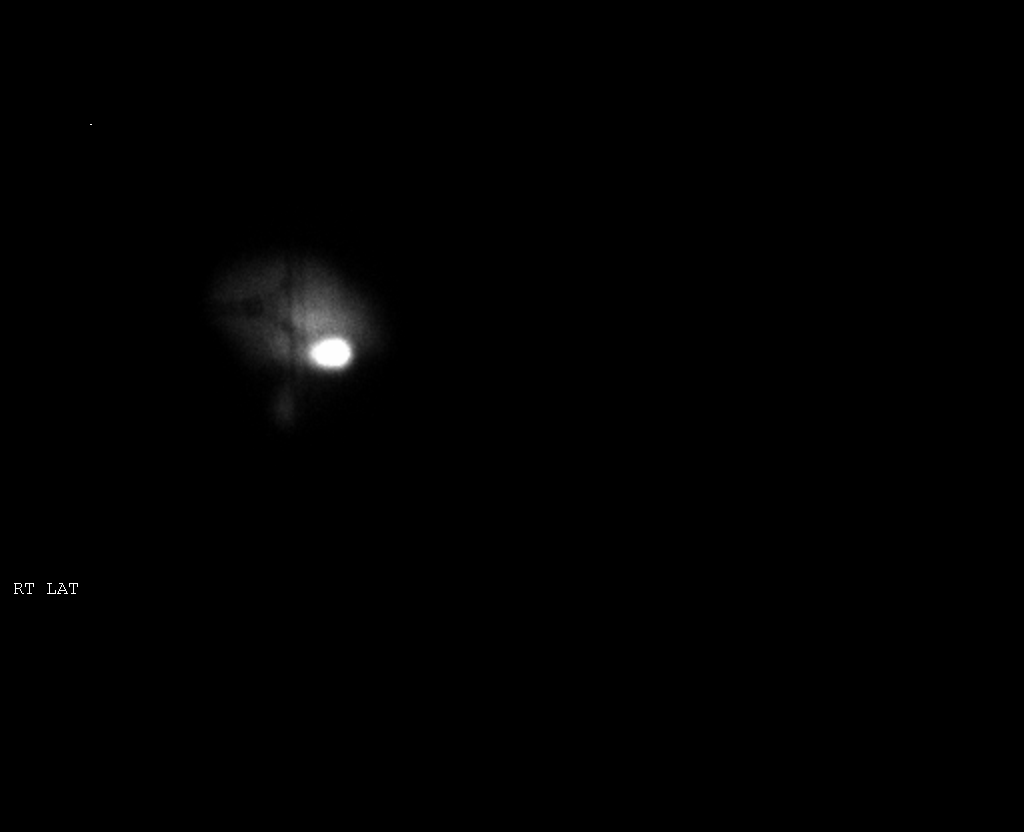

[he hepatobiliary · 3.43mm/px · 6 of 60 frames shown (2 of 2)]
[frame 6/60]
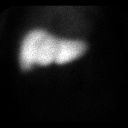
[frame 16/60]
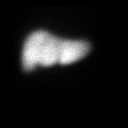
[frame 26/60]
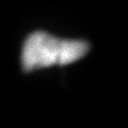
[frame 36/60]
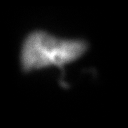
[frame 46/60]
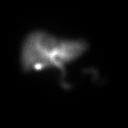
[frame 56/60]
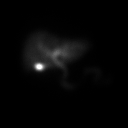

[7 of 7 positions shown; findings below may reference images not displayed]

FINDINGS: Prompt radiotracer uptake in the liver and clearance of
the blood pool.  Common bile duct activity first evident at 30-
minute, with subsequent small bowel activity.  Gallbladder activity
occurs at 45 minutes and increases over the course of the study.
IMPRESSION: Common bile duct and cystic duct are patent, without evidence of
acute cholecystitis.  However delayed appearance of the gallbladder
is noted which can be seen with chronic cholecystitis.

## 2011-07-02 IMAGING — RF DG CHOLANGIOGRAM OPERATIVE
1 series · 6 of 6 positions shown · non-contrast
Comparison: Abdomen ultrasound 04/15/2010 and earlier.

Fluoroscopy time of 0.3 minutes was utilized.

CLINICAL DATA: 70-year-old male undergoing laparoscopic
cholecystectomy.

INTRAOPERATIVE CHOLANGIOGRAM
TECHNIQUE: Cholangiographic images from the C-arm fluoroscopic
device were submitted for interpretation post-operatively.  Please
see the procedural report for the amount of contrast and the
fluoroscopy time utilized.

[Series 1: run · 3 acquisitions, 6 frames shown]
[im 1/3]
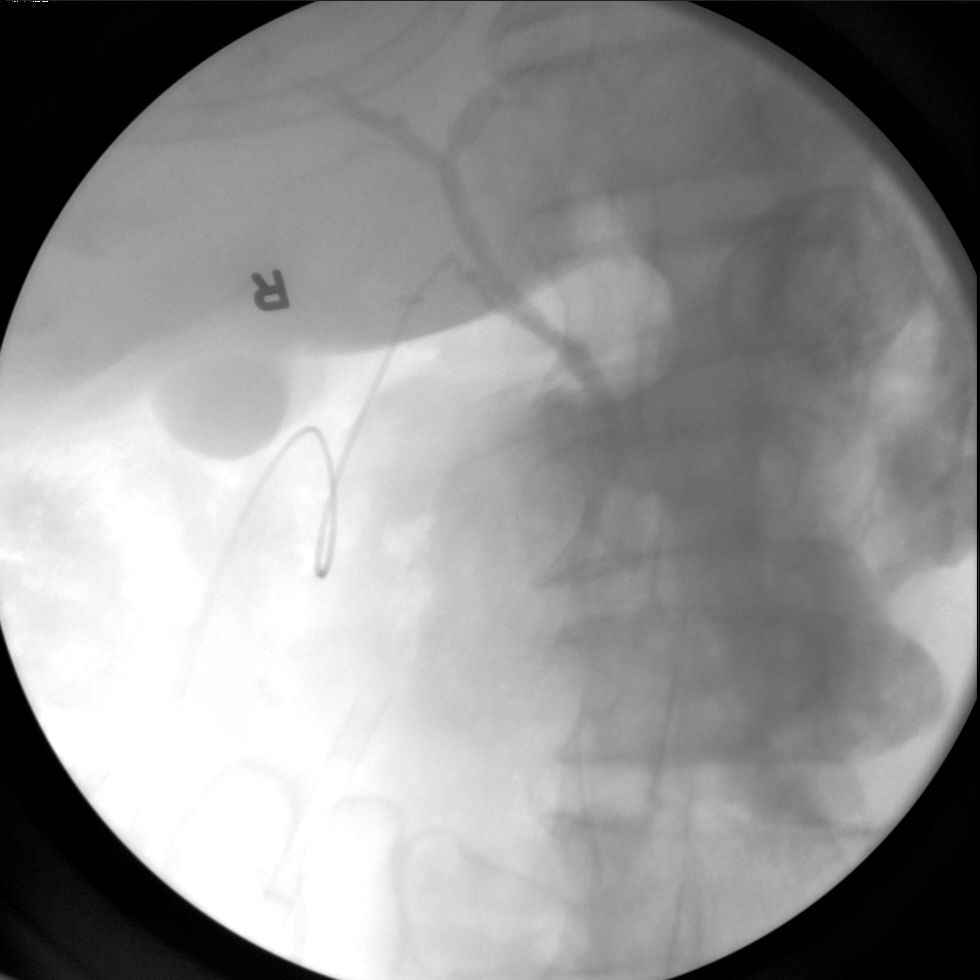
[im 1/3]
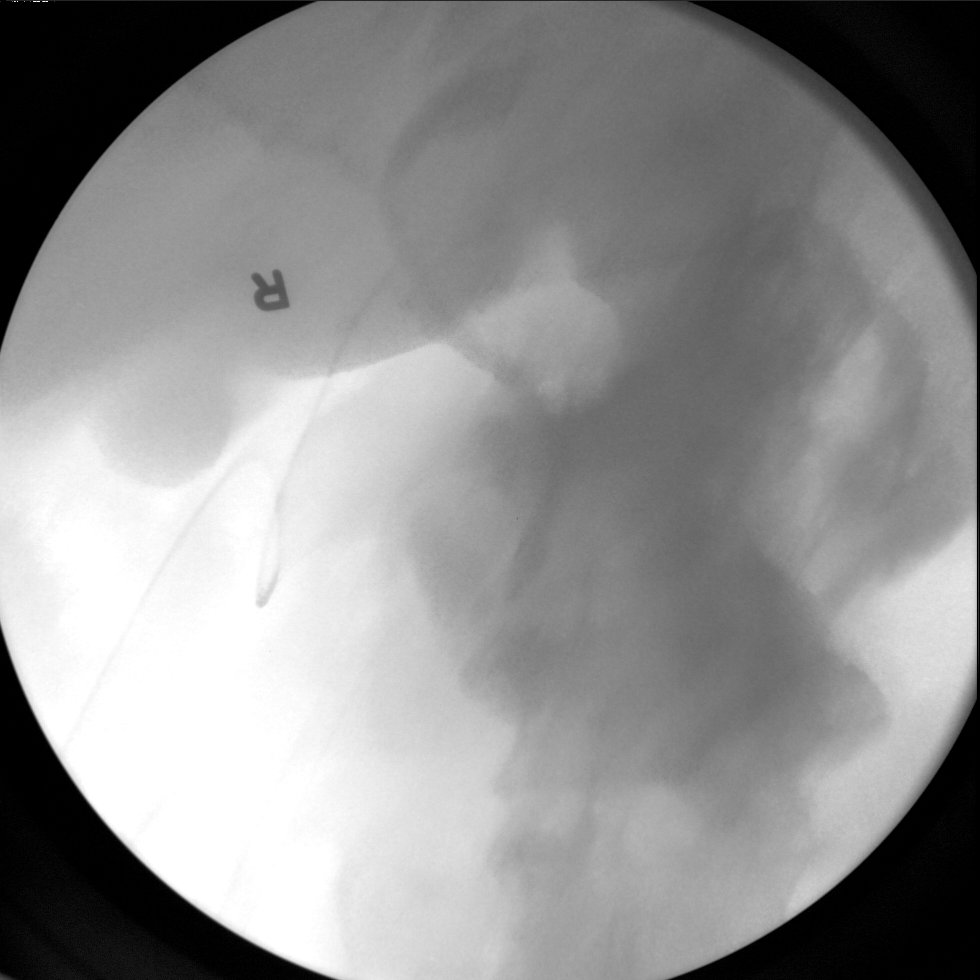
[im 1/3]
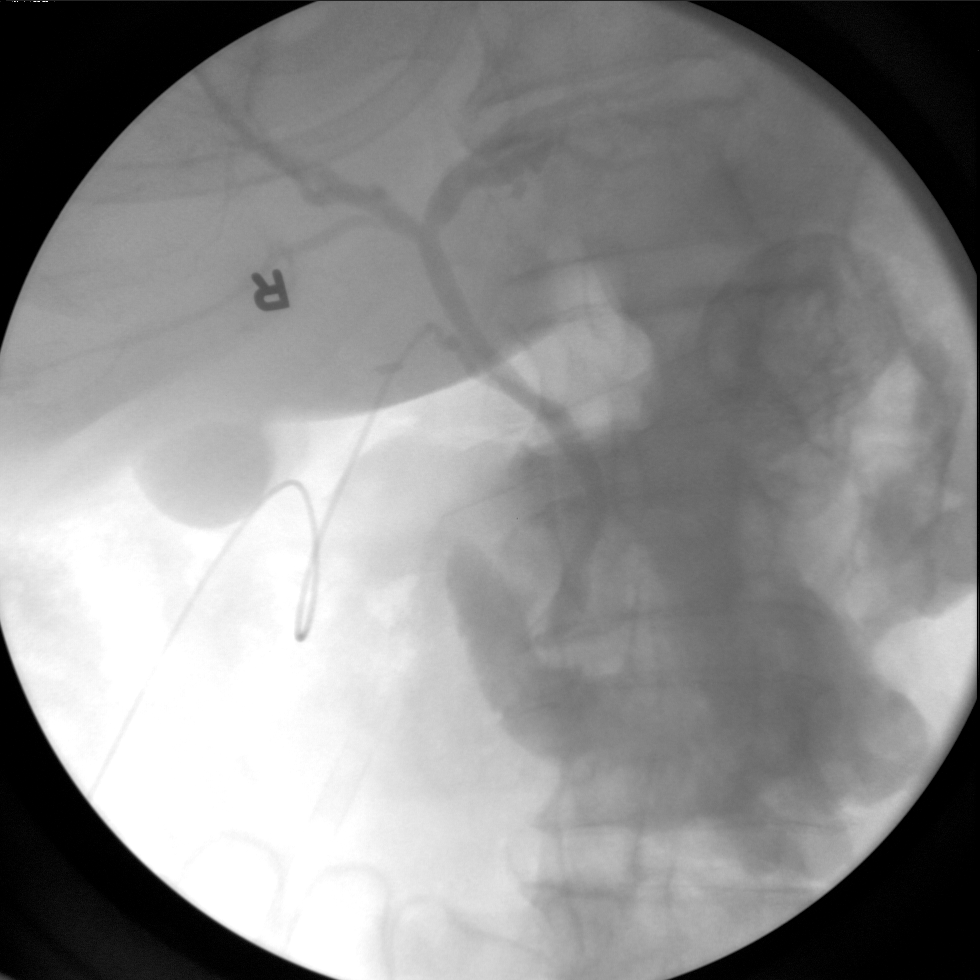
[im 1/3]
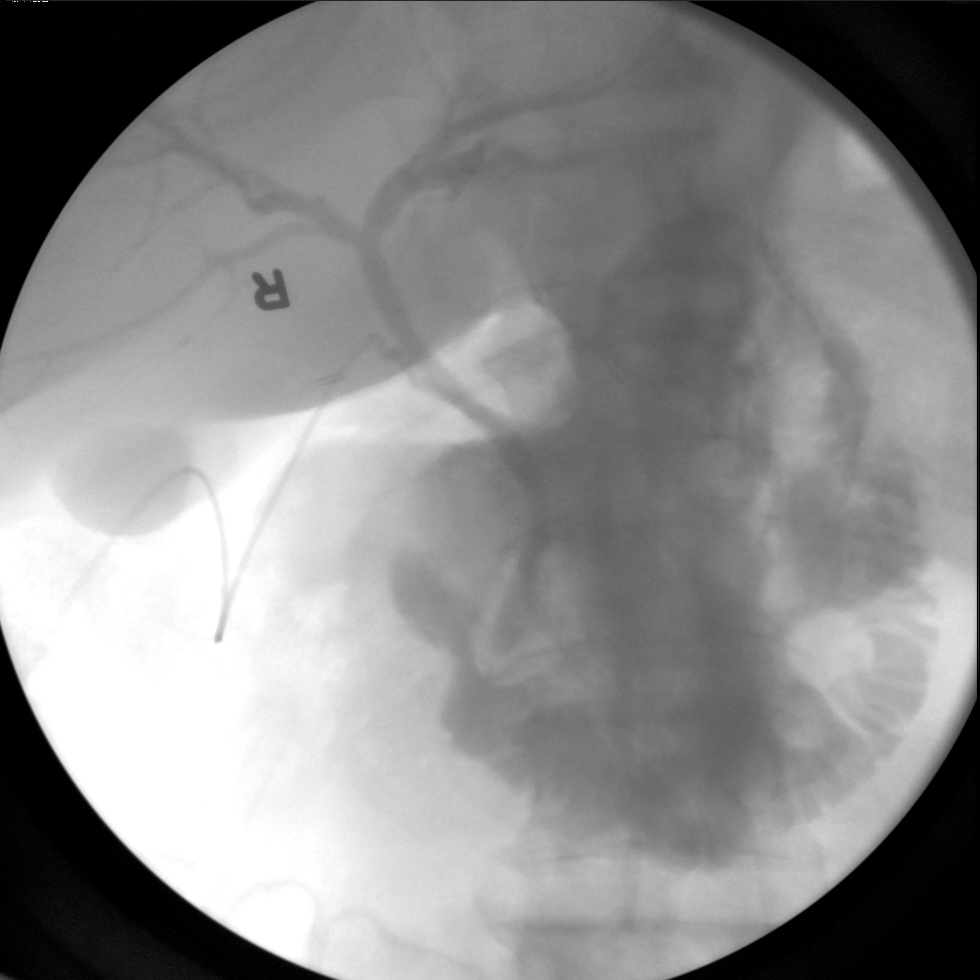
[im 2/3]
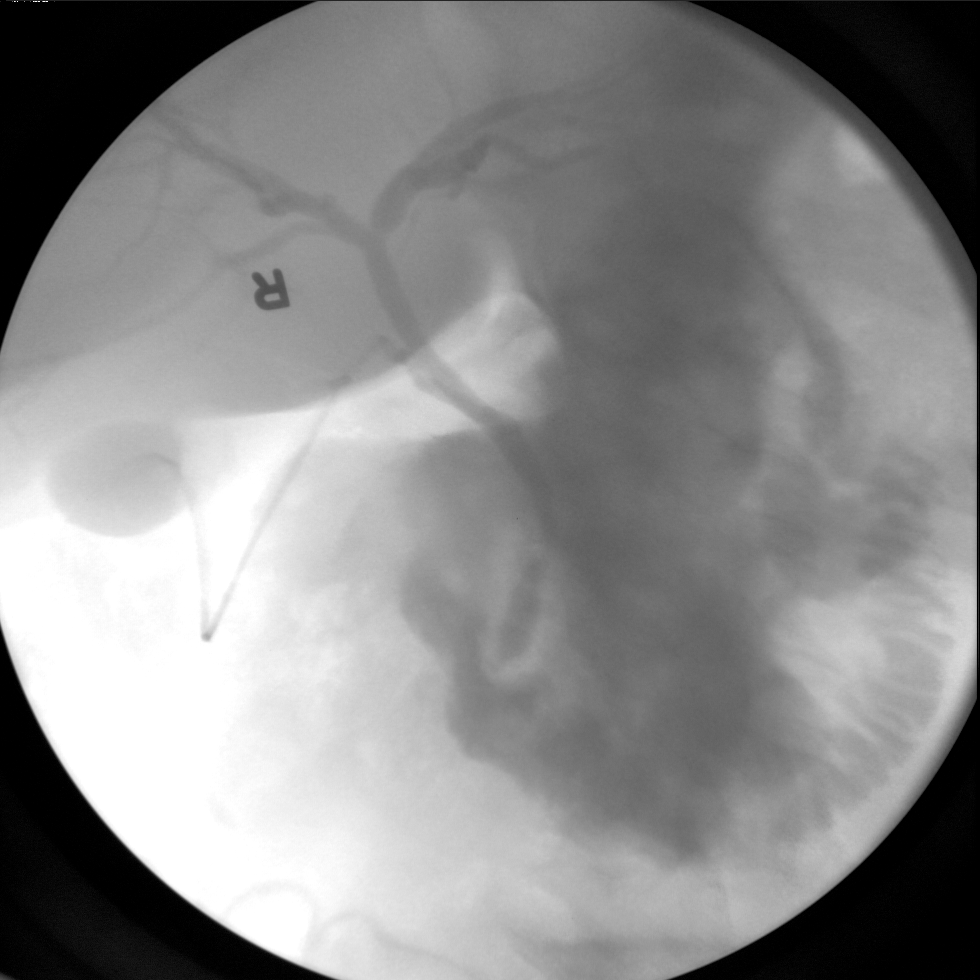
[im 3/3]
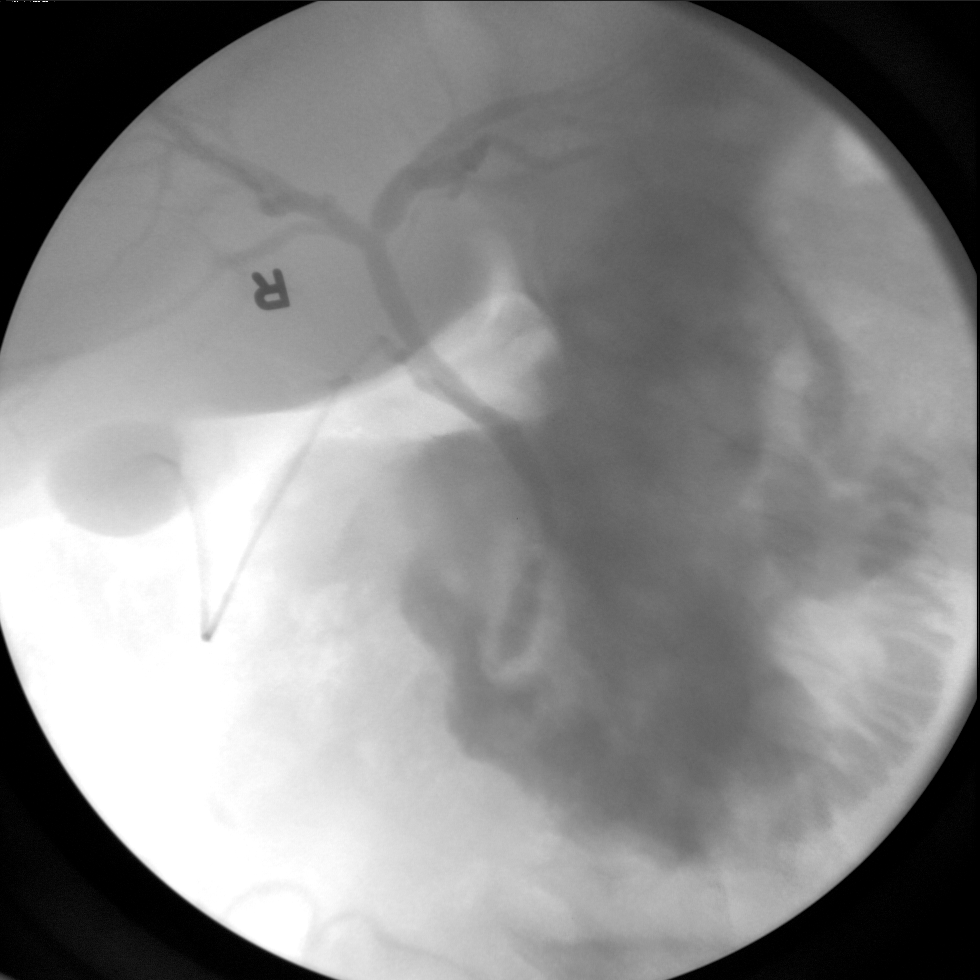

[6 of 6 positions shown; findings below may reference images not displayed]

FINDINGS: Multiple fluoroscopic images of the right upper
quadrant.  Surgical clip at the level of the cystic duct which is
cannulated.  Contrast is injected.  Nondilated intra and extra
panic biliary tree.  Motion artifact.  Prompt emptying of contrast
into the duodenum.  No filling defect or extravasation is evident.
IMPRESSION: Negative intraoperative cholangiogram.

## 2011-07-15 ENCOUNTER — Encounter (HOSPITAL_COMMUNITY): Payer: Self-pay | Admitting: *Deleted

## 2011-07-15 ENCOUNTER — Emergency Department (HOSPITAL_COMMUNITY)
Admission: EM | Admit: 2011-07-15 | Discharge: 2011-07-15 | Disposition: A | Discharge status: Home / Self Care | Payer: Medicare Other | Attending: Emergency Medicine | Admitting: Emergency Medicine

## 2011-07-15 ENCOUNTER — Emergency Department (HOSPITAL_COMMUNITY): Payer: Medicare Other

## 2011-07-15 DIAGNOSIS — R059 Cough, unspecified: Secondary | ICD-10-CM | POA: Insufficient documentation

## 2011-07-15 DIAGNOSIS — M542 Cervicalgia: Secondary | ICD-10-CM | POA: Insufficient documentation

## 2011-07-15 DIAGNOSIS — R531 Weakness: Secondary | ICD-10-CM

## 2011-07-15 DIAGNOSIS — M5412 Radiculopathy, cervical region: Secondary | ICD-10-CM | POA: Insufficient documentation

## 2011-07-15 DIAGNOSIS — R05 Cough: Secondary | ICD-10-CM | POA: Insufficient documentation

## 2011-07-15 DIAGNOSIS — R5381 Other malaise: Secondary | ICD-10-CM | POA: Insufficient documentation

## 2011-07-15 DIAGNOSIS — R350 Frequency of micturition: Secondary | ICD-10-CM | POA: Insufficient documentation

## 2011-07-15 DIAGNOSIS — R5383 Other fatigue: Secondary | ICD-10-CM | POA: Insufficient documentation

## 2011-07-15 DIAGNOSIS — I440 Atrioventricular block, first degree: Secondary | ICD-10-CM | POA: Insufficient documentation

## 2011-07-15 LAB — CBC
MCH: 22.1 pg — ABNORMAL LOW (ref 26.0–34.0)
Platelets: 327 10*3/uL (ref 150–400)
RBC: 5.06 MIL/uL (ref 4.22–5.81)

## 2011-07-15 LAB — URINALYSIS, ROUTINE W REFLEX MICROSCOPIC
Glucose, UA: NEGATIVE mg/dL
Hgb urine dipstick: NEGATIVE
Specific Gravity, Urine: 1.015 (ref 1.005–1.030)
pH: 5.5 (ref 5.0–8.0)

## 2011-07-15 LAB — DIFFERENTIAL
Basophils Relative: 1 % (ref 0–1)
Eosinophils Absolute: 0.3 10*3/uL (ref 0.0–0.7)
Lymphs Abs: 3.8 10*3/uL (ref 0.7–4.0)
Neutrophils Relative %: 55 % (ref 43–77)

## 2011-07-15 LAB — COMPREHENSIVE METABOLIC PANEL
ALT: 20 U/L (ref 0–53)
Albumin: 3.6 g/dL (ref 3.5–5.2)
Alkaline Phosphatase: 96 U/L (ref 39–117)
Glucose, Bld: 93 mg/dL (ref 70–99)
Potassium: 4.2 mEq/L (ref 3.5–5.1)
Sodium: 138 mEq/L (ref 135–145)
Total Protein: 7.1 g/dL (ref 6.0–8.3)

## 2011-07-15 LAB — URINE MICROSCOPIC-ADD ON

## 2011-07-15 MED ORDER — HYDROCODONE-ACETAMINOPHEN 5-500 MG PO TABS: 1.0000 | ORAL_TABLET | Freq: Four times a day (QID) | ORAL | Status: AC | PRN

## 2011-07-15 MED ORDER — DOCUSATE SODIUM 100 MG PO CAPS: 100.0000 mg | ORAL_CAPSULE | Freq: Two times a day (BID) | ORAL | Status: AC

## 2011-07-15 NOTE — ED Notes (Addendum)
To ED for eval of neck stiffness, weakness, and HA for the past cple of weeks. 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 Denies CP. Seen by endocrinology yesterday. Pt states the pain in his neck is worse with any movement.

## 2011-07-15 NOTE — ED Notes (Signed)
Pt states that he has chronic neck pain and then today he started having HA. Pt states that he is unaware if the neck pain is causing the HA. Pt denies having weakness or numbness. Pt has no neuro deficits.

## 2011-07-15 NOTE — Discharge Instructions (Signed)
Fatigue  Fatigue is a feeling of tiredness, lack of energy, lack of motivation, or feeling tired all the time. Having enough rest, good nutrition, and reducing stress will normally reduce fatigue. Consult your caregiver if it persists. The nature of your fatigue will help your caregiver to find out its cause. The treatment is based on the cause.   CAUSES   There are many causes for fatigue. Most of the time, fatigue can be traced to one or more of your habits or routines. Most causes fit into one or more of three general areas. They are:  Lifestyle problems   Sleep disturbances.   Overwork.   Physical exertion.   Unhealthy habits.   Poor eating habits or eating disorders.   Alcohol and/or drug use .   Lack of proper nutrition (malnutrition).  Psychological problems   Stress and/or anxiety problems.   Depression.   Grief.   Boredom.  Medical Problems or Conditions   Anemia.   Pregnancy.   Thyroid gland problems.   Recovery from major surgery.   Continuous pain.   Emphysema or asthma that is not well controlled   Allergic conditions.   Diabetes.   Infections (such as mononucleosis).   Obesity.   Sleep disorders, such as sleep apnea.   Heart failure or other heart-related problems.   Cancer.   Kidney disease.   Liver disease.   Effects of certain medicines such as antihistamines, cough and cold remedies, prescription pain medicines, heart and blood pressure medicines, drugs used for treatment of cancer, and some antidepressants.  SYMPTOMS   The symptoms of fatigue include:    Lack of energy.   Lack of drive (motivation).   Drowsiness.   Feeling of indifference to the surroundings.  DIAGNOSIS   The details of how you feel help guide your caregiver in finding out what is causing the fatigue. You will be asked about your present and past health condition. It is important to review all medicines that you take, including prescription and non-prescription items. A thorough exam will be done.  You will be questioned about your feelings, habits, and normal lifestyle. Your caregiver may suggest blood tests, urine tests, or other tests to look for common medical causes of fatigue.   TREATMENT   Fatigue is treated by correcting the underlying cause. For example, if you have continuous pain or depression, treating these causes will improve how you feel. Similarly, adjusting the dose of certain medicines will help in reducing fatigue.   HOME CARE INSTRUCTIONS    Try to get the required amount of good sleep every night.   Eat a healthy and nutritious diet, and drink enough water throughout the day.   Practice ways of relaxing (including yoga or meditation).   Exercise regularly.   Make plans to change situations that cause stress. Act on those plans so that stresses decrease over time. Keep your work and personal routine reasonable.   Avoid street drugs and minimize use of alcohol.   Start taking a daily multivitamin after consulting your caregiver.  SEEK MEDICAL CARE IF:    You have persistent tiredness, which cannot be accounted for.   You have fever.   You have unintentional weight loss.   You have headaches.   You have disturbed sleep throughout the night.   You are feeling sad.   You have constipation.   You have dry skin.   You have gained weight.   You are taking any new or different medicines that you   suspect are causing fatigue.   You are unable to sleep at night.   You develop any unusual swelling of your legs or other parts of your body.  SEEK IMMEDIATE MEDICAL CARE IF:    You are feeling confused.   Your vision is blurred.   You feel faint or pass out.   You develop severe headache.   You develop severe abdominal, pelvic, or back pain.   You develop chest pain, shortness of breath, or an irregular or fast heartbeat.   You are unable to pass a normal amount of urine.   You develop abnormal bleeding such as bleeding from the rectum or you vomit blood.   You have thoughts  about harming yourself or committing suicide.   You are worried that you might harm someone else.  MAKE SURE YOU:    Understand these instructions.   Will watch your condition.   Will get help right away if you are not doing well or get worse.  Document Released: 01/08/2007 Document Revised: 03/02/2011 Document Reviewed: 01/08/2007  [employer] Patient Information 2012 [employer]  Cervical Radiculopathy  Cervical radiculopathy happens when a nerve in the neck is pinched or bruised by a slipped (herniated) disk or by arthritic changes in the bones of the cervical spine. This can occur due to an injury or as part of the normal aging process. Pressure on the cervical nerves can cause pain or numbness that runs from your neck all the way down into your arm and fingers.  CAUSES   There are many possible causes, including:   Injury.   Muscle tightness in the neck from overuse.   Swollen, painful joints (arthritis).   Breakdown or degeneration in the bones and joints of the spine (spondylosis) due to aging.   Bone spurs that may develop near the cervical nerves.  SYMPTOMS   Symptoms include pain, weakness, or numbness in the affected arm and hand. Pain can be severe or irritating. Symptoms may be worse when extending or turning the neck.  DIAGNOSIS   Your caregiver will ask about your symptoms and do a physical exam. He or she may test your strength and reflexes. X-rays, CT scans, and MRI scans may be needed in cases of injury or if the symptoms do not go away after a period of time. Electromyography (EMG) or nerve conduction testing may be done to study how your nerves and muscles are working.  TREATMENT   Your caregiver may recommend certain exercises to help relieve your symptoms. Cervical radiculopathy can, and often does, get better with time and treatment. If your problems continue, treatment options may include:   Wearing a soft collar for short periods of time.   Physical therapy to strengthen the  neck muscles.   Medicines, such as nonsteroidal anti-inflammatory drugs (NSAIDs), oral corticosteroids, or spinal injections.   Surgery. Different types of surgery may be done depending on the cause of your problems.  HOME CARE INSTRUCTIONS    Put ice on the affected area.   Put ice in a plastic bag.   Place a towel between your skin and the bag.   Leave the ice on for 15 to 20 minutes, 3 to 4 times a day or as directed by your caregiver.   Use a flat pillow when you sleep.   Only take over-the-counter or prescription medicines for pain, discomfort, or fever as directed by your caregiver.   If physical therapy was prescribed, follow your caregiver's directions.   If a   soft collar was prescribed, use it as directed.  SEEK IMMEDIATE MEDICAL CARE IF:    Your pain gets much worse and cannot be controlled with medicines.   You have weakness or numbness in your hand, arm, face, or leg.   You have a high fever or a stiff, rigid neck.   You lose bowel or bladder control (incontinence).   You have trouble with walking, balance, or speaking.  MAKE SURE YOU:    Understand these instructions.   Will watch your condition.   Will get help right away if you are not doing well or get worse.  Document Released: 12/06/2000 Document Revised: 03/02/2011 Document Reviewed: 10/25/2010  [employer] Patient Information 2012 [employer]

## 2011-07-15 NOTE — ED Provider Notes (Signed)
History     CSN: [id number]  Arrival date & time 07/15/11  1847   First MD Initiated Contact with Patient 07/15/11 2055      Chief Complaint  Patient presents with  . Neck Pain  . Fatigue  . Headache    Location-generalized weakness/Neck pain-no radiation/quality-dull/duration-chronic/timing-intermittent/severity-mild to moderate/No associated sxs/No recent treatment) Patient is a 71 y.o. male presenting with weakness. The history is provided by the patient and a relative. No language interpreter was used.  Weakness Primary symptoms do not include headaches, syncope, loss of consciousness, altered mental status, seizures, dizziness, visual change, paresthesias, focal weakness, loss of sensation, speech change, memory loss, fever, nausea or vomiting. Episode onset: 1-3 weeks. The symptoms are unchanged. The neurological symptoms are diffuse.  Additional symptoms include weakness. Additional symptoms do not include neck stiffness, pain, lower back pain, leg pain, loss of balance, photophobia, hearing loss, tinnitus or vertigo. Medical issues do not include seizures or cerebral vascular accident.    Past Medical History  Diagnosis Date  . Cholecystitis 04/22/2010    acute s/p cholecystectomy  . Gallstone pancreatitis 04/22/10  . Atrial flutter     s/p CTI ablation by [facility] 08/05/10  . Diabetes mellitus type II   . Hyperlipidemia   . HTN (hypertension)   . Diastolic CHF, chronic     preserved EF  . Atrial fibrillation     with rapid ventricular response status post direct current cardioversion on December 07, 2010, to normal sinus rhythm  . Tobacco abuse   . Cervical radiculopathy   . Cervical radiculopathy   . Coronary artery disease     Est. EF of 55% -- History of nonobstructive coronary artery disease by cath in January 2012  . Heart palpitations   . History of MI (myocardial infarction)   . History of DVT (deep vein thrombosis)     Past Surgical History  Procedure Date  .  Cholecystectomy 1/12  . Atrial ablation surgery 08/05/10    CTI ablation for atrial flutter by [facility]  . Cardioversion 12/07/2010     Successful direct current cardioversion with atrial fibrillation to normal sinus rhythm  . Cardiac catheterization   . Subtotal gastrectomy     Family History  Problem Relation Age of Onset  . Cancer Mother   . Heart attack Father   . Heart attack Brother     History  Substance Use Topics  . Smoking status: Current Everyday Smoker -- 1.0 packs/day for 50 years    Types: Cigarettes  . Smokeless tobacco: Never Used  . Alcohol Use: No      Review of Systems  Constitutional: Negative for fever and chills.  HENT: Positive for neck pain. Negative for hearing loss, neck stiffness and tinnitus.        Chronic neck pain.  Eyes: Negative for photophobia and visual disturbance.  Respiratory: Positive for cough. Negative for chest tightness and shortness of breath.   Cardiovascular: Negative for chest pain, palpitations, leg swelling and syncope.  Gastrointestinal: Negative for nausea, vomiting, abdominal pain, diarrhea, constipation, blood in stool and abdominal distention.  Genitourinary: Positive for frequency. Negative for dysuria, urgency, hematuria, decreased urine volume and difficulty urinating.  Musculoskeletal: Negative for back pain and gait problem.  Skin: Negative for rash.  Neurological: Positive for weakness. Negative for dizziness, vertigo, tremors, speech change, focal weakness, seizures, loss of consciousness, syncope, facial asymmetry, speech difficulty, light-headedness, numbness, headaches, paresthesias and loss of balance.  Hematological: Negative for adenopathy. Does not bruise/bleed easily.  Psychiatric/Behavioral: Negative for memory loss, confusion and altered mental status.    Allergies  Review of patient's allergies indicates no known allergies.  Home Medications   Current Outpatient Rx  Name Route Sig Dispense Refill  .  ASPIRIN EC 81 MG PO TBEC Oral Take 81 mg by mouth every morning.    [name] DILTIAZEM HCL ER COATED BEADS 240 MG PO CP24 Oral Take 240 mg by mouth every morning.    [name] FLECAINIDE ACETATE 150 MG PO TABS Oral Take 150 mg by mouth 2 (two) times daily.    [name] HYDROCORTISONE 1 % EX CREA Topical Apply 1 application topically 2 (two) times daily.    . INSULIN GLARGINE 100 UNIT/ML [place] SOLN Subcutaneous Inject 54 Units into the skin at bedtime.     [name] LORATADINE 10 MG PO TABS Oral Take 10 mg by mouth every morning.     [name] METFORMIN HCL 1000 MG PO TABS Oral Take 1,000 mg by mouth 2 (two) times daily with a meal.     . METOPROLOL SUCCINATE ER 50 MG PO TB24 Oral Take 200 mg by mouth at bedtime.     [name] NITROGLYCERIN 0.4 MG SL SUBL Sublingual Place 0.4 mg under the tongue every 5 (five) minutes x 3 doses as needed. For chest pain.    [name] PRAVASTATIN SODIUM 20 MG PO TABS Oral Take 20 mg by mouth every morning.     . TETRAHYDROZOLINE HCL 0.05 % OP SOLN Left Eye Place 1 drop into the left eye 2 (two) times daily.    . WARFARIN SODIUM 5 MG PO TABS Oral Take 5-7.5 mg by mouth at bedtime. 1.5 tablets on Sunday, Tuesday, and Thursday. 1 tablet on all other days      BP 150/76  Pulse 84  Temp(Src) 97.9 F (36.6 C) (Oral)  Resp 16  SpO2 96%  Physical Exam  Constitutional: He is oriented to person, place, and time. He appears well-developed and well-nourished. No distress.  HENT:  Head: Normocephalic and atraumatic.  Eyes: Conjunctivae and EOM are normal. Pupils are equal, round, and reactive to light.  Neck: Normal range of motion. Neck supple. No JVD present.  Cardiovascular: Normal rate, regular rhythm, normal heart sounds and intact distal pulses.   No murmur heard. Pulmonary/Chest: Effort normal and breath sounds normal. No stridor. No respiratory distress. He has no wheezes. He has no rales. He exhibits no tenderness.  Abdominal: Soft. Bowel sounds are normal. He exhibits no distension and no mass. There is no  tenderness. There is no rebound and no guarding.  Musculoskeletal: Normal range of motion. He exhibits no edema and no tenderness.  Neurological: He is alert and oriented to person, place, and time. No cranial nerve deficit or sensory deficit. He exhibits normal muscle tone. Coordination normal. GCS eye subscore is 4. GCS verbal subscore is 5. GCS motor subscore is 6. He displays no Babinski's sign on the right side. He displays no Babinski's sign on the left side.  Reflex Scores:      Bicep reflexes are 1+ on the right side and 1+ on the left side.      Brachioradialis reflexes are 1+ on the right side and 1+ on the left side.      Patellar reflexes are 1+ on the right side and 1+ on the left side.      Achilles reflexes are 1+ on the right side and 1+ on the left side.      No ankle clonus. Negative  hoffmans sign bilat. Strength excellent in all 4 extremities. Spurling maneuver reproduced sx to both R and L sides.  Skin: Skin is warm and dry. He is not diaphoretic.  Psychiatric: He has a normal mood and affect. His behavior is normal.    ED Course  Procedures (including critical care time)  Labs Reviewed  CBC - Abnormal; Notable for the following:    WBC 11.2 (*)    Hemoglobin 11.2 (*)    HCT 36.5 (*)    MCV 72.1 (*)    MCH 22.1 (*)    RDW 16.8 (*)    All other components within normal limits  COMPREHENSIVE METABOLIC PANEL - Abnormal; Notable for the following:    Total Bilirubin 0.2 (*)    GFR calc non Af Amer 67 (*)    GFR calc Af Amer 77 (*)    All other components within normal limits  PROTIME-INR - Abnormal; Notable for the following:    Prothrombin Time 22.2 (*)    INR 1.91 (*)    All other components within normal limits  URINALYSIS, ROUTINE W REFLEX MICROSCOPIC - Abnormal; Notable for the following:    Leukocytes, UA SMALL (*)    All other components within normal limits  DIFFERENTIAL  URINE MICROSCOPIC-ADD ON  URINE CULTURE  TSH   Dg Chest 2 View  07/15/2011   *RADIOLOGY REPORT*  Clinical Data: Fatigue, weakness  CHEST - 2 VIEW  Comparison: 05/03/2011  Findings: Lungs are essentially clear.  No pleural effusion or pneumothorax.  Cardiomediastinal silhouette is within normal limits.  Degenerative changes of the visualized thoracolumbar spine.  Surgical clips at the GE junction.  IMPRESSION: No evidence of acute cardiopulmonary disease.  Original Report Authenticated By: Charline Bills, M.D.     1. Generalized weakness   2. Cervical radiculopathy       Date: 07/16/2011  Rate: 79  Rhythm: normal sinus rhythm  QRS Axis: normal  Intervals: PR prolonged  ST/T Wave abnormalities: nonspecific T wave changes  Conduction Disutrbances:first-degree A-V block   Narrative Interpretation:   Old EKG Reviewed: changes noted New TWI in aVL.   MDM  Pt is a well appearing 71yo M with chronic cervical radiculopathy worse over the last 3 weeks and mild generalized weakness for 1 week. Chronic cough mildly changed but CXR WNL. Chronic urinary frequency at night likely 2/2 BPH, UTI doubtful. EKG unremarkable. Labs unremarkable. Neck pain likely 2/2 chronic radiculopathy. Doubt central cervical cord compression given normal spine exam and lack of sx such as saddle anesthesia, urinary retention, incontinence. Clinical picture also doubtful for meningitis, encephalitis, anemia, stroke (no focal deficits), systemic infection, GB, arrhythmia, ACS, PE, PTX, hypothyroid (TSH pending), depression, dehydration or a rheumatologic cause. D/C home with PCP f/u and return precautions.         Consuello Masse, MD 07/16/11 [phone]

## 2011-07-17 LAB — URINE CULTURE
Colony Count: NO GROWTH
Culture: NO GROWTH

## 2011-07-17 NOTE — ED Provider Notes (Signed)
I saw and evaluated the patient, reviewed the resident's note and I agree with the findings and plan.  I have reviewed and agree with the EKG findings as documented by the resident.   Ethelda Chick, MD 07/17/11 [facility]ers

## 2011-10-09 ENCOUNTER — Emergency Department (HOSPITAL_COMMUNITY)
Admission: EM | Admit: 2011-10-09 | Discharge: 2011-10-09 | Disposition: A | Discharge status: Home / Self Care | Payer: Medicare Other | Attending: Emergency Medicine | Admitting: Emergency Medicine

## 2011-10-09 ENCOUNTER — Encounter (HOSPITAL_COMMUNITY): Payer: Self-pay

## 2011-10-09 ENCOUNTER — Emergency Department (HOSPITAL_COMMUNITY): Payer: Medicare Other

## 2011-10-09 DIAGNOSIS — Z794 Long term (current) use of insulin: Secondary | ICD-10-CM | POA: Insufficient documentation

## 2011-10-09 DIAGNOSIS — Z7901 Long term (current) use of anticoagulants: Secondary | ICD-10-CM | POA: Insufficient documentation

## 2011-10-09 DIAGNOSIS — I10 Essential (primary) hypertension: Secondary | ICD-10-CM | POA: Insufficient documentation

## 2011-10-09 DIAGNOSIS — I5032 Chronic diastolic (congestive) heart failure: Secondary | ICD-10-CM | POA: Insufficient documentation

## 2011-10-09 DIAGNOSIS — F17200 Nicotine dependence, unspecified, uncomplicated: Secondary | ICD-10-CM | POA: Insufficient documentation

## 2011-10-09 DIAGNOSIS — I509 Heart failure, unspecified: Secondary | ICD-10-CM | POA: Insufficient documentation

## 2011-10-09 DIAGNOSIS — Z9089 Acquired absence of other organs: Secondary | ICD-10-CM | POA: Insufficient documentation

## 2011-10-09 DIAGNOSIS — I4891 Unspecified atrial fibrillation: Secondary | ICD-10-CM | POA: Insufficient documentation

## 2011-10-09 DIAGNOSIS — Z7982 Long term (current) use of aspirin: Secondary | ICD-10-CM | POA: Insufficient documentation

## 2011-10-09 DIAGNOSIS — R5383 Other fatigue: Secondary | ICD-10-CM

## 2011-10-09 DIAGNOSIS — E119 Type 2 diabetes mellitus without complications: Secondary | ICD-10-CM | POA: Insufficient documentation

## 2011-10-09 DIAGNOSIS — R5381 Other malaise: Secondary | ICD-10-CM | POA: Insufficient documentation

## 2011-10-09 LAB — URINE MICROSCOPIC-ADD ON

## 2011-10-09 LAB — URINALYSIS, ROUTINE W REFLEX MICROSCOPIC
Ketones, ur: NEGATIVE mg/dL
Nitrite: NEGATIVE
Specific Gravity, Urine: 1.025 (ref 1.005–1.030)
Urobilinogen, UA: 0.2 mg/dL (ref 0.0–1.0)
pH: 5.5 (ref 5.0–8.0)

## 2011-10-09 LAB — COMPREHENSIVE METABOLIC PANEL
ALT: 19 U/L (ref 0–53)
AST: 17 U/L (ref 0–37)
Alkaline Phosphatase: 97 U/L (ref 39–117)
CO2: 23 mEq/L (ref 19–32)
Chloride: 99 mEq/L (ref 96–112)
Creatinine, Ser: 0.99 mg/dL (ref 0.50–1.35)
GFR calc non Af Amer: 80 mL/min — ABNORMAL LOW (ref >90)
Potassium: 3.6 mEq/L (ref 3.5–5.1)
Total Bilirubin: 0.1 mg/dL — ABNORMAL LOW (ref 0.3–1.2)

## 2011-10-09 LAB — CBC WITH DIFFERENTIAL/PLATELET
Basophils Relative: 1 % (ref 0–1)
Eosinophils Relative: 3 % (ref 0–5)
Hemoglobin: 11.4 g/dL — ABNORMAL LOW (ref 13.0–17.0)
MCH: 22.1 pg — ABNORMAL LOW (ref 26.0–34.0)
MCV: 73.5 fL — ABNORMAL LOW (ref 78.0–100.0)
Monocytes Absolute: 0.9 10*3/uL (ref 0.1–1.0)
Monocytes Relative: 11 % (ref 3–12)
Neutrophils Relative %: 65 % (ref 43–77)
RBC: 5.17 MIL/uL (ref 4.22–5.81)

## 2011-10-09 LAB — POCT I-STAT TROPONIN I

## 2011-10-09 MED ORDER — SODIUM CHLORIDE 0.9 % IV BOLUS (SEPSIS)
500.0000 mL | Freq: Once | INTRAVENOUS | Status: AC
  Administered 2011-10-09: 500 mL via INTRAVENOUS

## 2011-10-09 NOTE — ED Notes (Signed)
Per EMS, pt reports generalized weakness. States he woke up in a cold sweat. Denies pain.

## 2011-10-09 NOTE — ED Notes (Signed)
Pt. Reports he woke up in a cold sweat this morning. States generalized weakness. States "when I got out of bed I felt a little dizzy and my eyes went blurry. I got some cereal and milk and now I just fell weak all over". Walked from EMS stretcher to bed with no problems. Diabetic: CBG 120 per EMS. Hx of Afib. Temp 97.6.

## 2011-10-09 NOTE — ED Provider Notes (Signed)
History     CSN: [id number]  Arrival date & time 10/09/11  4098   First MD Initiated Contact with Patient 10/09/11 [phone]      Chief Complaint  Patient presents with  . Fatigue    (Consider location/radiation/quality/duration/timing/severity/associated sxs/prior treatment) HPI He c/o waking up soaking in sweat with generalized weakness.  He denies pain anywhere.   He denies fever, chills, n/v/diarrhea/sob.   He has had a np cough.   He smokes, has dm, and htn.  He says he walks in the am 6 d/week.   He denies recent illness.   Past Medical History  Diagnosis Date  . Cholecystitis 04/22/2010    acute s/p cholecystectomy  . Gallstone pancreatitis 04/22/10  . Atrial flutter     s/p CTI ablation by [facility] 08/05/10  . Diabetes mellitus type II   . Hyperlipidemia   . HTN (hypertension)   . Diastolic CHF, chronic     preserved EF  . Atrial fibrillation     with rapid ventricular response status post direct current cardioversion on December 07, 2010, to normal sinus rhythm  . Tobacco abuse   . Cervical radiculopathy   . Cervical radiculopathy   . Coronary artery disease     Est. EF of 55% -- History of nonobstructive coronary artery disease by cath in January 2012  . Heart palpitations   . History of MI (myocardial infarction)   . History of DVT (deep vein thrombosis)     Past Surgical History  Procedure Date  . Cholecystectomy 1/12  . Atrial ablation surgery 08/05/10    CTI ablation for atrial flutter by [facility]  . Cardioversion 12/07/2010     Successful direct current cardioversion with atrial fibrillation to normal sinus rhythm  . Cardiac catheterization   . Subtotal gastrectomy     Family History  Problem Relation Age of Onset  . Cancer Mother   . Heart attack Father   . Heart attack Brother     History  Substance Use Topics  . Smoking status: Current Everyday Smoker -- 1.0 packs/day for 50 years    Types: Cigarettes  . Smokeless tobacco: Never Used  . Alcohol Use: No        Review of Systems  Constitutional: Positive for diaphoresis and fatigue. Negative for fever and chills.  HENT: Negative for neck pain.   Eyes: Positive for visual disturbance.       Blurred vision  Respiratory: Positive for cough. Negative for shortness of breath.   Cardiovascular: Negative for chest pain, palpitations and leg swelling.  Gastrointestinal: Negative for nausea, vomiting, abdominal pain and diarrhea.  Musculoskeletal: Negative for back pain.  Skin: Negative for rash.  Neurological: Negative for headaches.  Hematological: Does not bruise/bleed easily.  Psychiatric/Behavioral: Negative for confusion.  All other systems reviewed and are negative.    Allergies  Review of patient's allergies indicates no known allergies.  Home Medications   Current Outpatient Rx  Name Route Sig Dispense Refill  . ASPIRIN EC 81 MG PO TBEC Oral Take 81 mg by mouth every morning.    [name] DILTIAZEM HCL ER COATED BEADS 240 MG PO CP24 Oral Take 240 mg by mouth every morning.    [name] FLECAINIDE ACETATE 150 MG PO TABS Oral Take 150 mg by mouth 2 (two) times daily.    [name] HYDROCORTISONE 1 % EX CREA Topical Apply 1 application topically 2 (two) times daily.    . INSULIN GLARGINE 100 UNIT/ML [place] SOLN Subcutaneous Inject 54 Units  into the skin at bedtime.     [name] LORATADINE 10 MG PO TABS Oral Take 10 mg by mouth every morning.     [name] METFORMIN HCL 1000 MG PO TABS Oral Take 1,000 mg by mouth 2 (two) times daily with a meal.     . METOPROLOL SUCCINATE ER 50 MG PO TB24 Oral Take 200 mg by mouth at bedtime.     [name] PRAVASTATIN SODIUM 20 MG PO TABS Oral Take 20 mg by mouth every morning.     . WARFARIN SODIUM 5 MG PO TABS Oral Take 5-7.5 mg by mouth at bedtime. 1.5 tablets on Sunday, Tuesday, and Thursday. 1 tablet on all other days    . NITROGLYCERIN 0.4 MG SL SUBL Sublingual Place 0.4 mg under the tongue every 5 (five) minutes x 3 doses as needed. For chest pain.      BP 105/59  Pulse 63  Temp 97.6 F  (36.4 C) (Oral)  Resp 20  SpO2 95%  Physical Exam  Nursing note and vitals reviewed. Constitutional: He is oriented to person, place, and time. No distress.       obese  HENT:  Head: Normocephalic and atraumatic.  Eyes: Conjunctivae are normal.  Neck: Normal range of motion. Neck supple.  Cardiovascular: Normal rate and intact distal pulses.   No murmur heard. Pulmonary/Chest: Effort normal. No respiratory distress. He has no wheezes. He has no rales.  Abdominal: Soft. He exhibits no distension. There is no tenderness. There is no guarding.  Musculoskeletal: Normal range of motion. He exhibits no edema and no tenderness.  Neurological: He is alert and oriented to person, place, and time.  Skin: Skin is warm and dry.  Psychiatric: He has a normal mood and affect. Thought content normal.    ED Course  Procedures (including critical care time) 41 y male with several chronic med problems presents with generalized weakness after waking up with soaking diaphoresis. No pain. No recent illness. np cough but no other sxs.  Will check cxr and screening labs.  Doubt acs.  No fever so infx unlikely.   bp is mildly low. He is on several meds that may lower bp.   May explain weakness but not sweating.     Labs Reviewed  CBC WITH DIFFERENTIAL  URINALYSIS, ROUTINE W REFLEX MICROSCOPIC  BASIC METABOLIC PANEL   No results found.   No diagnosis found.  ECG nsr hr 63 bpm 1st degree av block LAD  normal sttw No significant change from 07/15/11   MDM  weakness        Cheri Guppy, MD 10/10/11 1606

## 2011-10-09 NOTE — ED Provider Notes (Signed)
0900 report received from Dr. Andree Elk. Patient will be admitted to CDU pending u/a. Patient will be discharged home if normal. Rehydration in progress for generalized weakness. Nodular density on the left upper chest x-ray. Patient needs followup in 6 months.  1610 oh labs and urine unremarkable today. Patient will be discharged back to his PCP on [address] with [facility].  Remi Haggard, NP 10/09/11 1015

## 2011-10-09 NOTE — ED Provider Notes (Signed)
Medical screening examination/treatment/procedure(s) were conducted as a shared visit with non-physician practitioner(s) and myself.  I personally evaluated the patient during the encounter  Cheri Guppy, MD 10/09/11 [phone]

## 2011-10-09 NOTE — ED Notes (Signed)
Report called to Drinda Butts and Lawson Fiscal in CDU

## 2011-10-09 NOTE — ED Notes (Signed)
Spoke with radiologist and ct chest nonemergent recommended, md caporossi made aware.

## 2011-10-28 IMAGING — CR DG CHEST 1V PORT
1 series · 1 of 1 positions shown · non-contrast
Comparison: 04/08/2010

CLINICAL DATA: Chest pain.

PORTABLE CHEST - 1 VIEW

[AP]
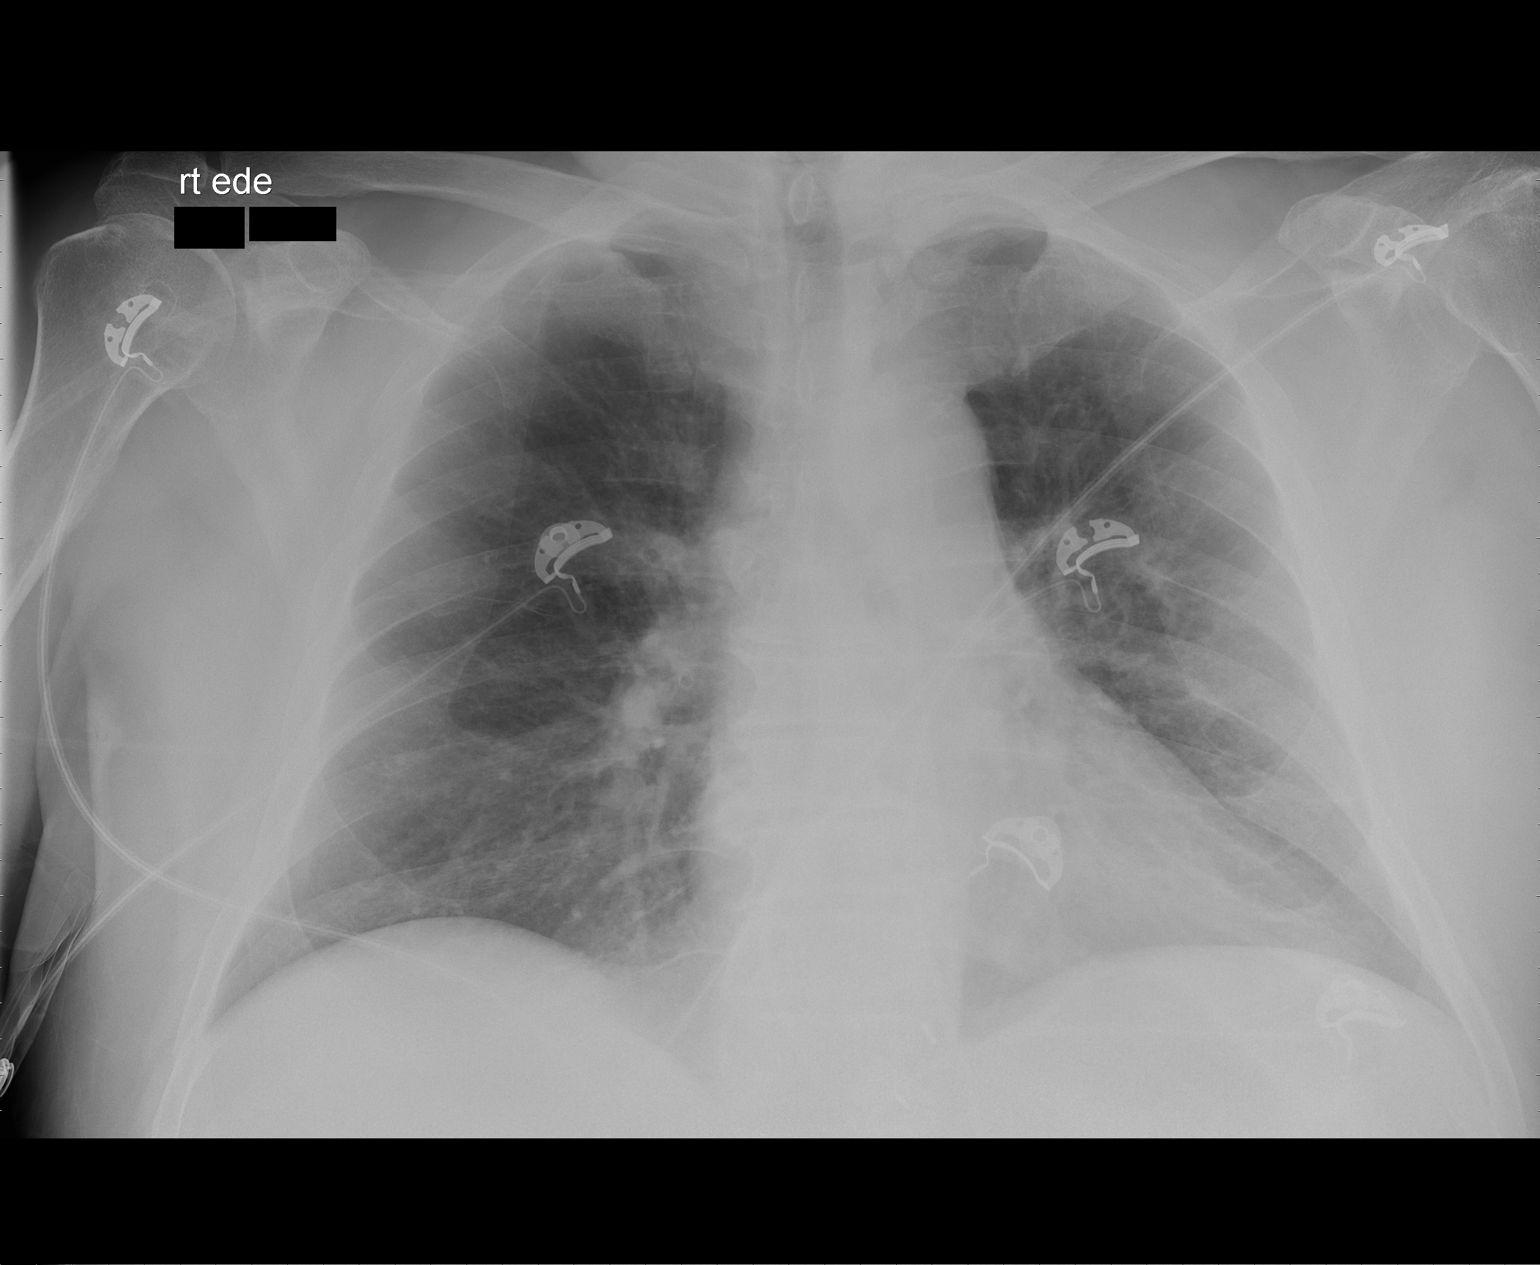

[1 of 1 positions shown; findings below may reference images not displayed]

FINDINGS: The heart size and vascularity are normal and the lungs
are clear.  No acute osseous abnormality.
IMPRESSION: No acute abnormalities.

## 2011-11-09 IMAGING — CR DG CHEST 2V
2 series · 2 of 2 positions shown · non-contrast
Comparison: Chest x-ray 08/17/2010.

CLINICAL DATA: Shortness of breath.

CHEST - 2 VIEW

[w chest pa]
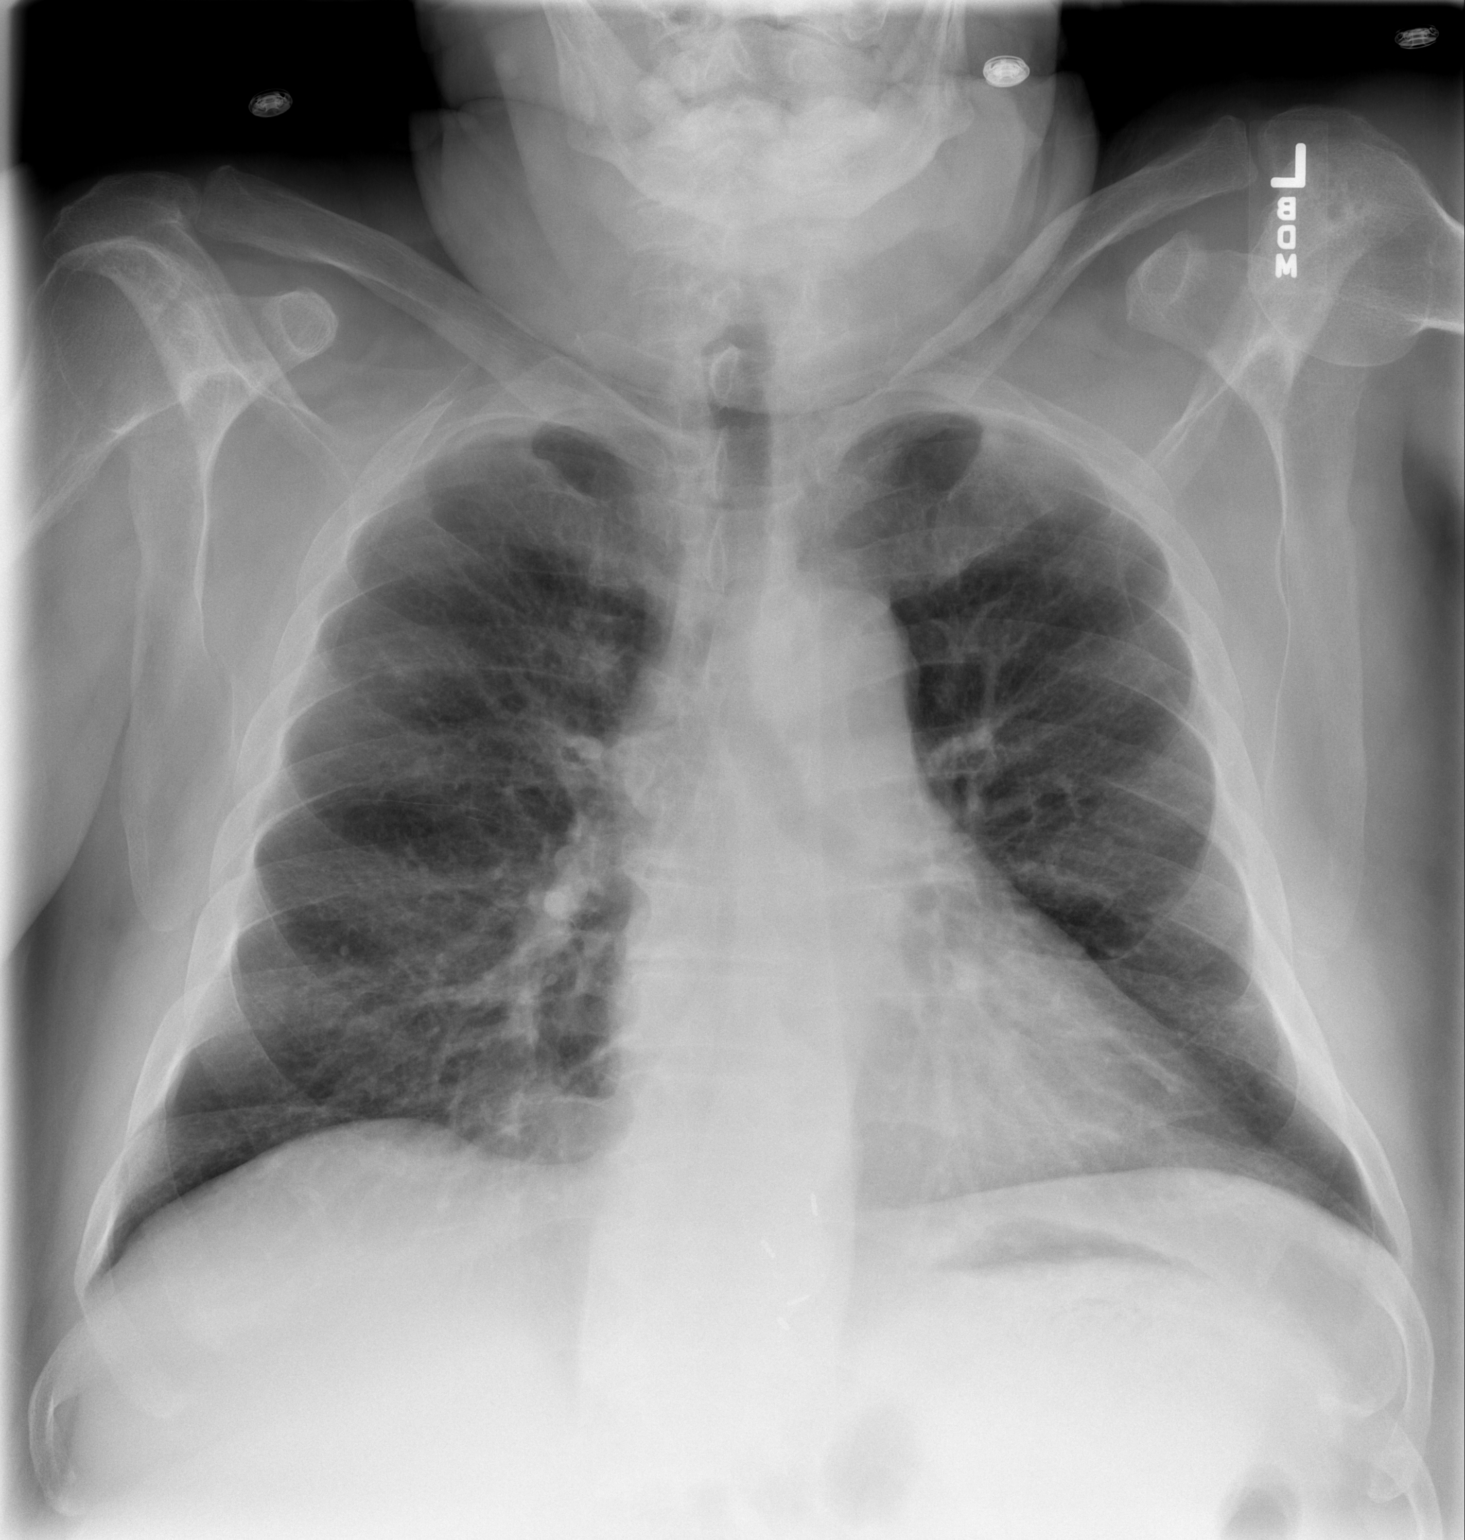

[w chest lat]
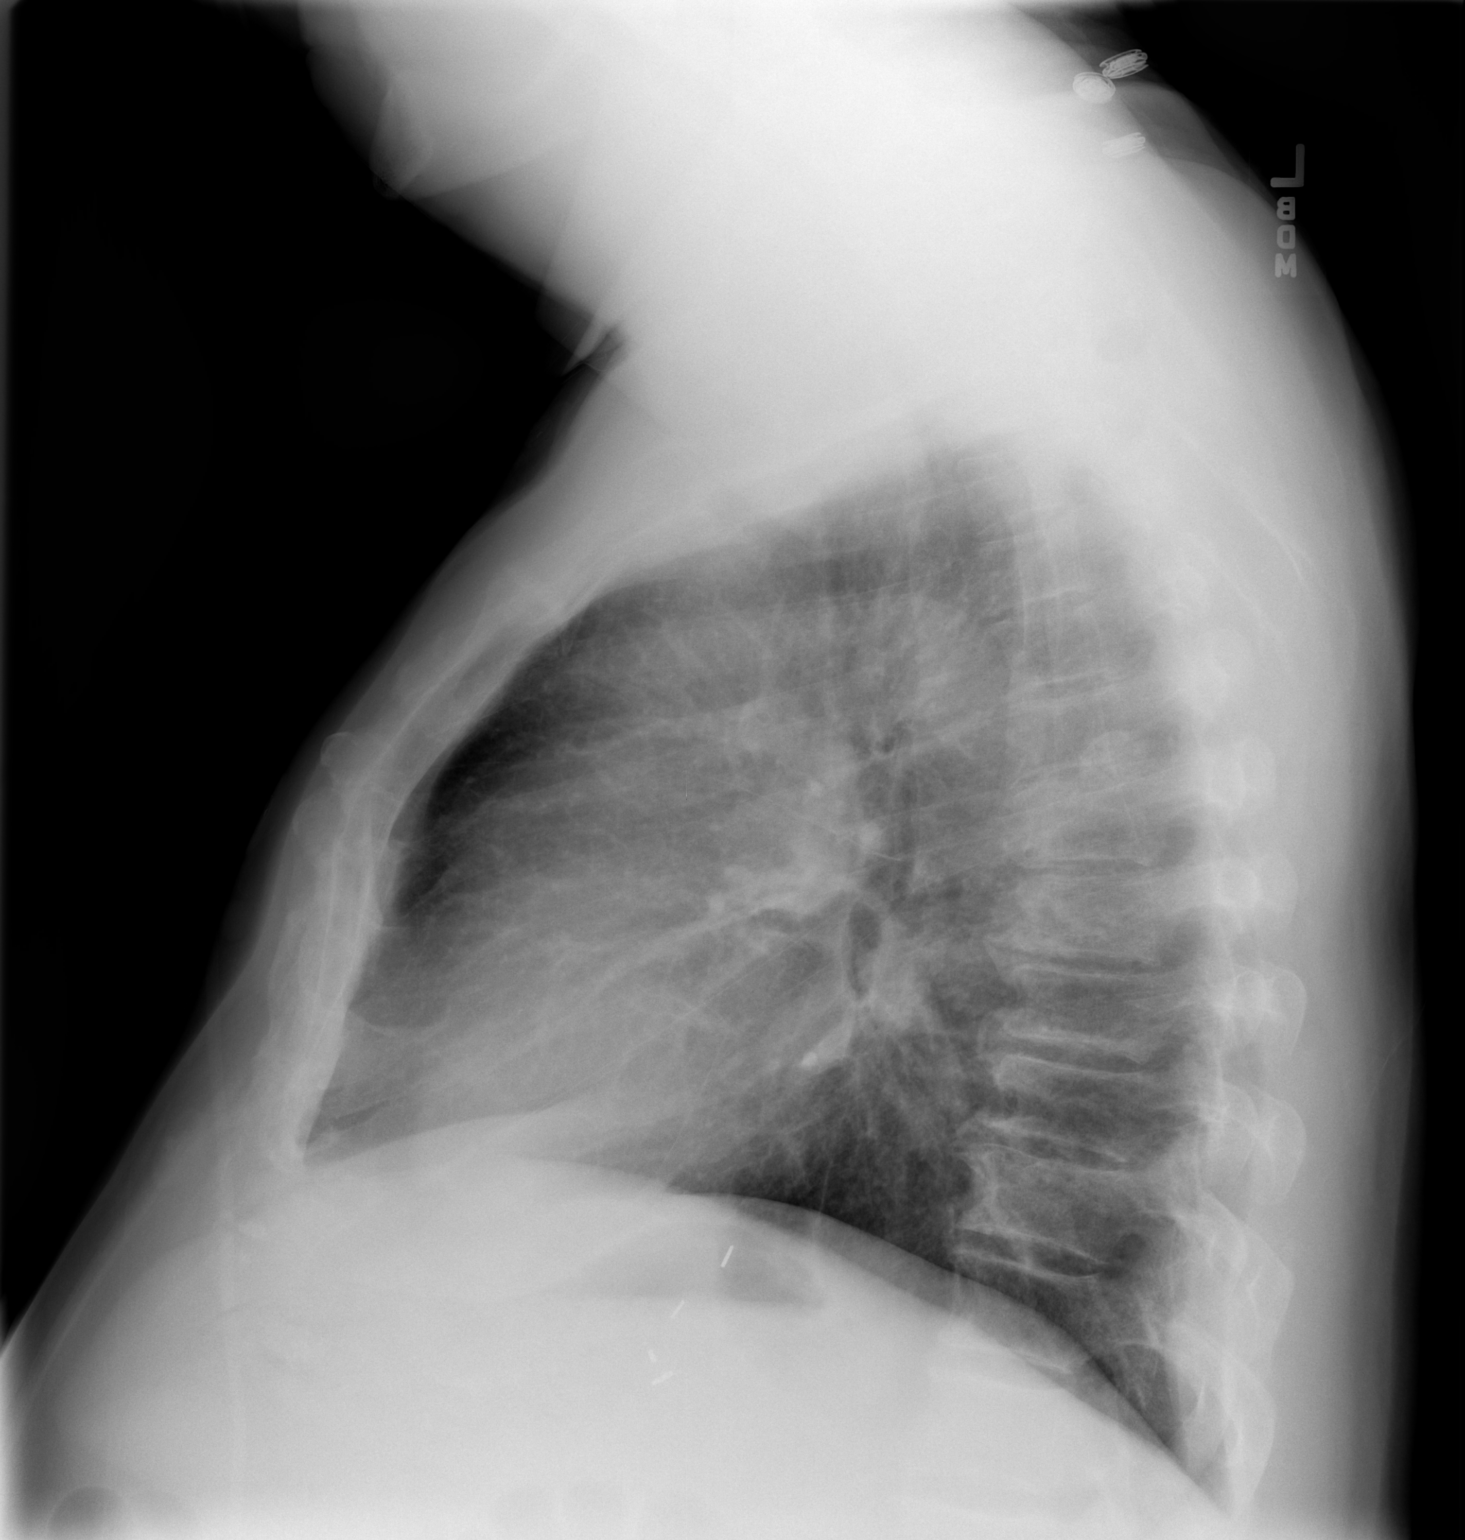

[2 of 2 positions shown; findings below may reference images not displayed]

FINDINGS: The cardiac silhouette, mediastinal and hilar contours
are stable.  There are mild underlying chronic bronchitic type lung
changes, likely related to smoking.  Mild superimposed vascular
congestion but no infiltrates, edema or effusions.  The bony thorax
is intact.
IMPRESSION: Chronic bronchitic type lung changes, likely related to smoking.
Mild vascular congestion but no infiltrates or edema.

## 2011-11-19 ENCOUNTER — Encounter (HOSPITAL_COMMUNITY): Payer: Self-pay | Admitting: *Deleted

## 2011-11-19 ENCOUNTER — Emergency Department (HOSPITAL_COMMUNITY)
Admission: EM | Admit: 2011-11-19 | Discharge: 2011-11-19 | Discharge status: Against Medical Advice | Payer: Medicare Other | Attending: Emergency Medicine | Admitting: Emergency Medicine

## 2011-11-19 ENCOUNTER — Emergency Department (HOSPITAL_COMMUNITY)
Admission: EM | Admit: 2011-11-19 | Discharge: 2011-11-20 | Disposition: A | Discharge status: Home / Self Care | Payer: Medicare Other | Attending: Emergency Medicine | Admitting: Emergency Medicine

## 2011-11-19 ENCOUNTER — Encounter (HOSPITAL_COMMUNITY): Payer: Self-pay | Admitting: Emergency Medicine

## 2011-11-19 DIAGNOSIS — Z794 Long term (current) use of insulin: Secondary | ICD-10-CM | POA: Insufficient documentation

## 2011-11-19 DIAGNOSIS — R58 Hemorrhage, not elsewhere classified: Secondary | ICD-10-CM | POA: Insufficient documentation

## 2011-11-19 DIAGNOSIS — E119 Type 2 diabetes mellitus without complications: Secondary | ICD-10-CM | POA: Insufficient documentation

## 2011-11-19 DIAGNOSIS — Z7901 Long term (current) use of anticoagulants: Secondary | ICD-10-CM | POA: Insufficient documentation

## 2011-11-19 DIAGNOSIS — X58XXXA Exposure to other specified factors, initial encounter: Secondary | ICD-10-CM | POA: Insufficient documentation

## 2011-11-19 DIAGNOSIS — F17200 Nicotine dependence, unspecified, uncomplicated: Secondary | ICD-10-CM | POA: Insufficient documentation

## 2011-11-19 DIAGNOSIS — I252 Old myocardial infarction: Secondary | ICD-10-CM | POA: Insufficient documentation

## 2011-11-19 DIAGNOSIS — I4891 Unspecified atrial fibrillation: Secondary | ICD-10-CM | POA: Insufficient documentation

## 2011-11-19 DIAGNOSIS — Z86718 Personal history of other venous thrombosis and embolism: Secondary | ICD-10-CM | POA: Insufficient documentation

## 2011-11-19 DIAGNOSIS — IMO0002 Reserved for concepts with insufficient information to code with codable children: Secondary | ICD-10-CM | POA: Insufficient documentation

## 2011-11-19 DIAGNOSIS — E785 Hyperlipidemia, unspecified: Secondary | ICD-10-CM | POA: Insufficient documentation

## 2011-11-19 DIAGNOSIS — Z79899 Other long term (current) drug therapy: Secondary | ICD-10-CM | POA: Insufficient documentation

## 2011-11-19 LAB — PROTIME-INR: INR: 1.68 — ABNORMAL HIGH (ref 0.00–1.49)

## 2011-11-19 NOTE — ED Notes (Signed)
Unable to locate pt  

## 2011-11-19 NOTE — ED Notes (Signed)
Pt sts was scratching right leg and scratched wound that was bleeding; pt sts takes coumadin and could not get to stop bleeding; bleeding controlled at present

## 2011-11-19 NOTE — ED Notes (Signed)
Patient called, no answer.  Will retry.

## 2011-11-19 NOTE — ED Notes (Signed)
Pt experiencing uncontrolled bleeding in R lower leg. Pt denies injury, but scratched at his leg. Pt take coumadin.

## 2011-11-19 NOTE — ED Notes (Signed)
ZOX:[id number]<AV> Expected date:<BR> Expected time:<BR> Means of arrival:<BR> Comments:<BR> CLOSED

## 2011-11-20 LAB — POCT I-STAT, CHEM 8
Creatinine, Ser: 1.1 mg/dL (ref 0.50–1.35)
Hemoglobin: 13.6 g/dL (ref 13.0–17.0)
Potassium: 4.1 mEq/L (ref 3.5–5.1)
Sodium: 139 mEq/L (ref 135–145)
TCO2: 24 mmol/L (ref 0–100)

## 2011-11-20 LAB — PROTIME-INR: INR: 1.45 (ref 0.00–1.49)

## 2011-11-20 NOTE — ED Provider Notes (Signed)
History     CSN: [id number]  Arrival date & time 11/19/11  2041   First MD Initiated Contact with Patient 11/20/11 0025      Chief Complaint  Patient presents with  . Coagulation Disorder    (Consider location/radiation/quality/duration/timing/severity/associated sxs/prior treatment) HPI Comments: 71 year old male with a history of atrial fibrillation requiring anticoagulation therapy who presents with right lower extremity bleeding wound that has been ongoing bleeding for approximately 6 hours. He does not remember the inciting event but thinks he may scratch to the leg. The bleeding is mild, persistent, better with pressure and dressing, worse when dressing comes off. He denies lightheadedness shortness of breath.  The history is provided by the patient.    Past Medical History  Diagnosis Date  . Cholecystitis 04/22/2010    acute s/p cholecystectomy  . Gallstone pancreatitis 04/22/10  . Atrial flutter     s/p CTI ablation by [facility] 08/05/10  . Diabetes mellitus type II   . Hyperlipidemia   . HTN (hypertension)   . Diastolic CHF, chronic     preserved EF  . Atrial fibrillation     with rapid ventricular response status post direct current cardioversion on December 07, 2010, to normal sinus rhythm  . Tobacco abuse   . Cervical radiculopathy   . Cervical radiculopathy   . Coronary artery disease     Est. EF of 55% -- History of nonobstructive coronary artery disease by cath in January 2012  . Heart palpitations   . History of MI (myocardial infarction)   . History of DVT (deep vein thrombosis)     Past Surgical History  Procedure Date  . Cholecystectomy 1/12  . Atrial ablation surgery 08/05/10    CTI ablation for atrial flutter by [facility]  . Cardioversion 12/07/2010     Successful direct current cardioversion with atrial fibrillation to normal sinus rhythm  . Cardiac catheterization   . Subtotal gastrectomy     Family History  Problem Relation Age of Onset  . Cancer Mother    . Heart attack Father   . Heart attack Brother     History  Substance Use Topics  . Smoking status: Current Everyday Smoker -- 1.0 packs/day for 50 years    Types: Cigarettes  . Smokeless tobacco: Never Used  . Alcohol Use: No      Review of Systems  Respiratory: Negative for shortness of breath.   Gastrointestinal: Negative for nausea and vomiting.  Neurological: Negative for light-headedness.  Hematological: Bruises/bleeds easily.    Allergies  Review of patient's allergies indicates no known allergies.  Home Medications   Current Outpatient Rx  Name Route Sig Dispense Refill  . ASPIRIN EC 81 MG PO TBEC Oral Take 81 mg by mouth every morning.    [name] DILTIAZEM HCL ER COATED BEADS 240 MG PO CP24 Oral Take 240 mg by mouth every morning.    [name] FLECAINIDE ACETATE 150 MG PO TABS Oral Take 150 mg by mouth 2 (two) times daily.    . INSULIN GLARGINE 100 UNIT/ML [place] SOLN Subcutaneous Inject 27 Units into the skin at bedtime.     [name] LORATADINE 10 MG PO TABS Oral Take 10 mg by mouth every morning.     [name] METFORMIN HCL 1000 MG PO TABS Oral Take 1,000 mg by mouth 2 (two) times daily with a meal.     . METOPROLOL SUCCINATE ER 50 MG PO TB24 Oral Take 200 mg by mouth at bedtime.     [name] NITROGLYCERIN  0.4 MG SL SUBL Sublingual Place 0.4 mg under the tongue every 5 (five) minutes x 3 doses as needed. For chest pain.    [name] PRAVASTATIN SODIUM 20 MG PO TABS Oral Take 20 mg by mouth every morning.     . WARFARIN SODIUM 5 MG PO TABS Oral Take 5-7.5 mg by mouth at bedtime. 1.5 tablets on Sunday, Tuesday, and Thursday. 1 tablet on all other days      BP 108/82  Pulse 80  Temp 98.3 F (36.8 C) (Oral)  Resp 20  SpO2 99%  Physical Exam  Nursing note and vitals reviewed. Constitutional: He appears well-developed and well-nourished. No distress.  HENT:  Head: Normocephalic and atraumatic.  Eyes: Conjunctivae are normal. No scleral icterus.  Pulmonary/Chest: Effort normal.  Musculoskeletal: Normal  range of motion. He exhibits no edema and no tenderness.  Neurological: He is alert. Coordination normal.  Skin: Skin is warm and dry.       Anterior mid right lower extremity with punctate wound that is mildly oozing dark red blood.    ED Course  Procedures (including critical care time)  Labs Reviewed  PROTIME-INR - Abnormal; Notable for the following:    Prothrombin Time 17.9 (*)     All other components within normal limits  POCT I-STAT, CHEM 8 - Abnormal; Notable for the following:    Glucose, Bld 121 (*)     Calcium, Ion 1.11 (*)     All other components within normal limits   No results found.   1. Bleeding       MDM  Dressing placed, will apply quick clot, dressing, reevaluate.   No further bleeding after > 1 hour of observation, pt stable for d/c.     Vida Roller, MD 11/20/11 [phone]

## 2011-12-04 ENCOUNTER — Emergency Department (HOSPITAL_COMMUNITY): Payer: Medicare Other

## 2011-12-04 ENCOUNTER — Observation Stay (HOSPITAL_COMMUNITY)
Admission: EM | Admit: 2011-12-04 | Discharge: 2011-12-05 | Disposition: A | Discharge status: Home / Self Care | Payer: Medicare Other | Attending: Internal Medicine | Admitting: Internal Medicine

## 2011-12-04 ENCOUNTER — Encounter (HOSPITAL_COMMUNITY): Payer: Self-pay | Admitting: Emergency Medicine

## 2011-12-04 DIAGNOSIS — I4819 Other persistent atrial fibrillation: Secondary | ICD-10-CM | POA: Diagnosis present

## 2011-12-04 DIAGNOSIS — I4892 Unspecified atrial flutter: Secondary | ICD-10-CM

## 2011-12-04 DIAGNOSIS — I5032 Chronic diastolic (congestive) heart failure: Secondary | ICD-10-CM

## 2011-12-04 DIAGNOSIS — IMO0002 Reserved for concepts with insufficient information to code with codable children: Secondary | ICD-10-CM

## 2011-12-04 DIAGNOSIS — I10 Essential (primary) hypertension: Secondary | ICD-10-CM

## 2011-12-04 DIAGNOSIS — E119 Type 2 diabetes mellitus without complications: Secondary | ICD-10-CM | POA: Insufficient documentation

## 2011-12-04 DIAGNOSIS — I2510 Atherosclerotic heart disease of native coronary artery without angina pectoris: Secondary | ICD-10-CM

## 2011-12-04 DIAGNOSIS — I4891 Unspecified atrial fibrillation: Secondary | ICD-10-CM

## 2011-12-04 DIAGNOSIS — R079 Chest pain, unspecified: Secondary | ICD-10-CM

## 2011-12-04 DIAGNOSIS — E1165 Type 2 diabetes mellitus with hyperglycemia: Secondary | ICD-10-CM

## 2011-12-04 DIAGNOSIS — I509 Heart failure, unspecified: Secondary | ICD-10-CM

## 2011-12-04 LAB — POCT I-STAT, CHEM 8
BUN: 13 mg/dL (ref 6–23)
Calcium, Ion: 1.14 mmol/L (ref 1.13–1.30)
Chloride: 102 mEq/L (ref 96–112)
Creatinine, Ser: 1.1 mg/dL (ref 0.50–1.35)
Glucose, Bld: 133 mg/dL — ABNORMAL HIGH (ref 70–99)
HCT: 36 % — ABNORMAL LOW (ref 39.0–52.0)
Hemoglobin: 12.2 g/dL — ABNORMAL LOW (ref 13.0–17.0)
Potassium: 4.1 mEq/L (ref 3.5–5.1)
Sodium: 139 mEq/L (ref 135–145)
TCO2: 23 mmol/L (ref 0–100)

## 2011-12-04 LAB — CBC WITH DIFFERENTIAL/PLATELET
Basophils Absolute: 0.1 10*3/uL (ref 0.0–0.1)
Basophils Relative: 1 % (ref 0–1)
Eosinophils Absolute: 0.2 10*3/uL (ref 0.0–0.7)
Eosinophils Relative: 3 % (ref 0–5)
HCT: 35.7 % — ABNORMAL LOW (ref 39.0–52.0)
Hemoglobin: 10.5 g/dL — ABNORMAL LOW (ref 13.0–17.0)
Lymphocytes Relative: 29 % (ref 12–46)
Lymphs Abs: 2.2 10*3/uL (ref 0.7–4.0)
MCH: 20.8 pg — ABNORMAL LOW (ref 26.0–34.0)
MCHC: 29.4 g/dL — ABNORMAL LOW (ref 30.0–36.0)
MCV: 70.6 fL — ABNORMAL LOW (ref 78.0–100.0)
Monocytes Absolute: 0.7 10*3/uL (ref 0.1–1.0)
Monocytes Relative: 9 % (ref 3–12)
Neutro Abs: 4.5 10*3/uL (ref 1.7–7.7)
Neutrophils Relative %: 58 % (ref 43–77)
Platelets: 298 10*3/uL (ref 150–400)
RBC: 5.06 MIL/uL (ref 4.22–5.81)
RDW: 17.2 % — ABNORMAL HIGH (ref 11.5–15.5)
WBC: 7.7 10*3/uL (ref 4.0–10.5)

## 2011-12-04 LAB — COMPREHENSIVE METABOLIC PANEL
ALT: 20 U/L (ref 0–53)
AST: 16 U/L (ref 0–37)
Alkaline Phosphatase: 91 U/L (ref 39–117)
CO2: 25 mEq/L (ref 19–32)
Calcium: 8.9 mg/dL (ref 8.4–10.5)
Chloride: 101 mEq/L (ref 96–112)
GFR calc Af Amer: 77 mL/min — ABNORMAL LOW (ref >90)
GFR calc non Af Amer: 67 mL/min — ABNORMAL LOW (ref >90)
Glucose, Bld: 139 mg/dL — ABNORMAL HIGH (ref 70–99)
Potassium: 3.9 mEq/L (ref 3.5–5.1)
Sodium: 136 mEq/L (ref 135–145)
Total Bilirubin: 0.2 mg/dL — ABNORMAL LOW (ref 0.3–1.2)

## 2011-12-04 LAB — CK TOTAL AND CKMB (NOT AT ARMC)
Relative Index: 1.5 (ref 0.0–2.5)
Total CK: 145 U/L (ref 7–232)

## 2011-12-04 LAB — GLUCOSE, CAPILLARY
Glucose-Capillary: 121 mg/dL — ABNORMAL HIGH (ref 70–99)
Glucose-Capillary: 134 mg/dL — ABNORMAL HIGH (ref 70–99)

## 2011-12-04 LAB — POCT I-STAT TROPONIN I: Troponin i, poc: 0 ng/mL (ref 0.00–0.08)

## 2011-12-04 LAB — PROTIME-INR: INR: 1.95 — ABNORMAL HIGH (ref 0.00–1.49)

## 2011-12-04 MED ORDER — DILTIAZEM HCL ER COATED BEADS 240 MG PO CP24
240.0000 mg | ORAL_CAPSULE | Freq: Every day | ORAL | Status: DC
  Administered 2011-12-04 – 2011-12-05 (×2): 240 mg via ORAL
  Filled 2011-12-04 (×2): qty 1

## 2011-12-04 MED ORDER — HYDROCODONE-ACETAMINOPHEN 5-325 MG PO TABS: 1.0000 | ORAL_TABLET | ORAL | Status: DC | PRN

## 2011-12-04 MED ORDER — INSULIN GLARGINE 100 UNIT/ML ~~LOC~~ SOLN
15.0000 [IU] | Freq: Every day | SUBCUTANEOUS | Status: DC
  Administered 2011-12-04: 15 [IU] via SUBCUTANEOUS

## 2011-12-04 MED ORDER — WARFARIN - PHARMACIST DOSING INPATIENT: Freq: Every day | Status: DC

## 2011-12-04 MED ORDER — WARFARIN SODIUM 7.5 MG PO TABS
7.5000 mg | ORAL_TABLET | Freq: Once | ORAL | Status: DC
  Filled 2011-12-04 (×2): qty 1

## 2011-12-04 MED ORDER — SIMVASTATIN 10 MG PO TABS
10.0000 mg | ORAL_TABLET | Freq: Every day | ORAL | Status: DC
  Administered 2011-12-04: 10 mg via ORAL
  Filled 2011-12-04 (×2): qty 1

## 2011-12-04 MED ORDER — ASPIRIN EC 81 MG PO TBEC
81.0000 mg | DELAYED_RELEASE_TABLET | Freq: Every day | ORAL | Status: DC
  Administered 2011-12-04 – 2011-12-05 (×2): 81 mg via ORAL
  Filled 2011-12-04 (×2): qty 1

## 2011-12-04 MED ORDER — NITROGLYCERIN 0.4 MG SL SUBL: 0.4000 mg | SUBLINGUAL_TABLET | SUBLINGUAL | Status: DC | PRN

## 2011-12-04 MED ORDER — LORATADINE 10 MG PO TABS
10.0000 mg | ORAL_TABLET | Freq: Every day | ORAL | Status: DC
  Administered 2011-12-04 – 2011-12-05 (×2): 10 mg via ORAL
  Filled 2011-12-04 (×2): qty 1

## 2011-12-04 MED ORDER — ONDANSETRON HCL 4 MG PO TABS: 4.0000 mg | ORAL_TABLET | Freq: Four times a day (QID) | ORAL | Status: DC | PRN

## 2011-12-04 MED ORDER — FLECAINIDE ACETATE 50 MG PO TABS
150.0000 mg | ORAL_TABLET | Freq: Two times a day (BID) | ORAL | Status: DC
  Administered 2011-12-04 – 2011-12-05 (×3): 150 mg via ORAL
  Filled 2011-12-04 (×4): qty 1

## 2011-12-04 MED ORDER — ALUM & MAG HYDROXIDE-SIMETH 200-200-20 MG/5ML PO SUSP: 30.0000 mL | Freq: Four times a day (QID) | ORAL | Status: DC | PRN

## 2011-12-04 MED ORDER — ONDANSETRON HCL 4 MG/2ML IJ SOLN: 4.0000 mg | Freq: Four times a day (QID) | INTRAMUSCULAR | Status: DC | PRN

## 2011-12-04 MED ORDER — METOPROLOL SUCCINATE ER 100 MG PO TB24
200.0000 mg | ORAL_TABLET | Freq: Every day | ORAL | Status: DC
  Administered 2011-12-04: 200 mg via ORAL
  Filled 2011-12-04 (×2): qty 2

## 2011-12-04 MED ORDER — SODIUM CHLORIDE 0.9 % IJ SOLN
3.0000 mL | Freq: Two times a day (BID) | INTRAMUSCULAR | Status: DC
  Administered 2011-12-04 – 2011-12-05 (×3): 3 mL via INTRAVENOUS

## 2011-12-04 MED ORDER — ASPIRIN 81 MG PO CHEW: 324.0000 mg | CHEWABLE_TABLET | Freq: Once | ORAL | Status: DC

## 2011-12-04 MED ORDER — INSULIN ASPART 100 UNIT/ML ~~LOC~~ SOLN
0.0000 [IU] | Freq: Three times a day (TID) | SUBCUTANEOUS | Status: DC
  Administered 2011-12-04 (×3): 2 [IU] via SUBCUTANEOUS
  Administered 2011-12-05: 3 [IU] via SUBCUTANEOUS

## 2011-12-04 MED ORDER — SODIUM CHLORIDE 0.9 % IV SOLN
INTRAVENOUS | Status: DC
  Administered 2011-12-05: 08:00:00 via INTRAVENOUS

## 2011-12-04 MED ORDER — POLYETHYLENE GLYCOL 3350 17 G PO PACK: 17.0000 g | PACK | Freq: Every day | ORAL | Status: DC | PRN

## 2011-12-04 NOTE — ED Notes (Signed)
PT c/o chest pain 0100 woke him up; nitro 1 SL given and pt took aspirin at home; chest pain free at this time.

## 2011-12-04 NOTE — Progress Notes (Signed)
Patient arrived from Ed via Doctor, general practice.  Ambulated to bathroom.  Patient placed on monitor.

## 2011-12-04 NOTE — Progress Notes (Addendum)
Subjective: Denies any chest pain or shortness of breath. Has not had any chest pain since receiving the nitroglycerin in the ambulance.  Objective: Vital signs in last 24 hours: Filed Vitals:   12/04/11 0500 12/04/11 0530 12/04/11 0630 12/04/11 0738  BP: 107/78 112/56 101/54 128/83  Pulse:  53 51 61  Temp:    97.7 F (36.5 C)  TempSrc:    Oral  Resp:  18 14 18   Height:    5\' 6"  (1.676 m)  Weight:    97.07 kg (214 lb)  SpO2:  96% 94% 97%   Weight change:   Intake/Output Summary (Last 24 hours) at 12/04/11 0856 Last data filed at 12/04/11 0812  Gross per 24 hour  Intake    240 ml  Output    100 ml  Net    140 ml    Physical Exam: General: Awake, Oriented, No acute distress. HEENT: EOMI. Neck: Supple CV: S1 and S2 Lungs: Clear to ascultation bilaterally Abdomen: Soft, Nontender, Nondistended, +bowel sounds. Ext: Good pulses. Trace edema.  Lab Results: Basic Metabolic Panel:  Lab 12/04/11 1610 12/04/11 0450  NA 139 136  K 4.1 3.9  CL 102 101  CO2 -- 25  GLUCOSE 133* 139*  BUN 13 14  CREATININE 1.10 1.08  CALCIUM -- 8.9  MG -- --  PHOS -- --   Liver Function Tests:  Lab 12/04/11 0450  AST 16  ALT 20  ALKPHOS 91  BILITOT 0.2*  PROT 6.9  ALBUMIN 3.3*   No results found for this basename: LIPASE:5,AMYLASE:5 in the last 168 hours No results found for this basename: AMMONIA:5 in the last 168 hours CBC:  Lab 12/04/11 0459 12/04/11 0450  WBC -- 7.7  NEUTROABS -- 4.5  HGB 12.2* 10.5*  HCT 36.0* 35.7*  MCV -- 70.6*  PLT -- 298   Cardiac Enzymes: No results found for this basename: CKTOTAL:5,CKMB:5,CKMBINDEX:5,TROPONINI:5 in the last 168 hours BNP (last 3 results) No results found for this basename: PROBNP:3 in the last 8760 hours CBG:  Lab 12/04/11 0818  GLUCAP 121*   No results found for this basename: HGBA1C:5 in the last 72 hours Other Labs: No components found with this basename: POCBNP:3 No results found for this basename: DDIMER:2 in the  last 168 hours No results found for this basename: CHOL:2,HDL:2,LDLCALC:2,TRIG:2,CHOLHDL:2,LDLDIRECT:2 in the last 168 hours No results found for this basename: TSH,T4TOTAL,FREET3,T3FREE,FREET4,THYROIDAB in the last 168 hours No results found for this basename: VITAMINB12:2,FOLATE:2,FERRITIN:2,TIBC:2,IRON:2,RETICCTPCT:2 in the last 168 hours  Micro Results: No results found for this or any previous visit (from the past 240 hour(s)).  Studies/Results: Dg Chest 2 View  12/04/2011  *RADIOLOGY REPORT*  Clinical Data: Chest pain.  Shortness of breath.  Cough.  Weakness.  CHEST - 2 VIEW  Comparison: 10/09/2011.  Findings: Normal heart size and pulmonary vascularity.  Suggestion of emphysematous changes and scattered fibrosis in the lungs. Nodular opacity in the left mid lung as seen previously appears to have an annular appearance today and may be due to scarring. Nodular opacity projected over the posterior lumbar spine likely represents hypertrophic change and is stable.  No focal airspace consolidation.  No blunting of costophrenic angles.  No pneumothorax.  Mediastinal contours appear intact.  Degenerative changes throughout the thoracic spine.  IMPRESSION: Stable appearance of the chest since previous study.  No evidence of active pulmonary disease.   Original Report Authenticated By: Marlon Pel, M.D.     Medications: I have reviewed the patient's current medications. Scheduled  Meds:   . aspirin  324 mg Oral Once  . aspirin EC  81 mg Oral Daily  . diltiazem  240 mg Oral Daily  . flecainide  150 mg Oral BID  . insulin aspart  0-15 Units Subcutaneous TID WC  . insulin glargine  15 Units Subcutaneous QHS  . loratadine  10 mg Oral Daily  . metoprolol succinate  200 mg Oral QHS  . simvastatin  10 mg Oral q1800  . sodium chloride  3 mL Intravenous Q12H  . warfarin  7.5 mg Oral ONCE-1800  . Warfarin - Pharmacist Dosing Inpatient   Does not apply q1800   Continuous Infusions:   . sodium  chloride     PRN Meds:.alum & mag hydroxide-simeth, HYDROcodone-acetaminophen, nitroGLYCERIN, ondansetron (ZOFRAN) IV, ondansetron, polyethylene glycol, DISCONTD: nitroGLYCERIN  Assessment/Plan: Chest pain Etiology unclear, per cath in Jan of 2012 has no occlusive coronary artery disease. Patient is chest pain free, continue aspirin 81 mg, continue statin, and metoprolol.  Cycle troponins to rule out ACS. In sinus on telemetry. Continue sublingual nitroglycerin when necessary and morphine when necessary for chest pain.  Hypertension Stable, continue home medications.   Axial fibrillation Currently normal sinus rhythm (EKG showed sinus p-waves noted versus junctional), rate controllted. Coumadin per pharmacy. Continue with the flecainide, beta blockers and Cardizem.  Chronic diastolic CHF Appears to be euvolemic, not in exacerbation.  Diabetes mellitus Continue with Lantus at a lower. Sliding scale insulin, will hold metformin.  Prophylaxis On warfarin.  Dispo Pending cardiology evaluation.   LOS: 0 days  Jaydalyn Demattia A, MD 12/04/2011, 8:56 AM

## 2011-12-04 NOTE — H&P (Signed)
[facility]                                                                                    Patient Demographics  [name], is a 71 y.o. male  CSN: [id number]  MRN: [id number]  DOB - 1941/02/18  Admit Date - 12/04/2011  Outpatient Primary MD for the patient is [name], MD   With History of -  Past Medical History  Diagnosis Date  . Cholecystitis 04/22/2010    acute s/p cholecystectomy  . Gallstone pancreatitis 04/22/10  . Atrial flutter     s/p CTI ablation by [facility] 08/05/10  . Diabetes mellitus type II   . Hyperlipidemia   . HTN (hypertension)   . Diastolic CHF, chronic     preserved EF  . Atrial fibrillation     with rapid ventricular response status post direct current cardioversion on December 07, 2010, to normal sinus rhythm  . Tobacco abuse   . Cervical radiculopathy   . Cervical radiculopathy   . Coronary artery disease     Est. EF of 55% -- History of nonobstructive coronary artery disease by cath in January 2012  . Heart palpitations   . History of MI (myocardial infarction)   . History of DVT (deep vein thrombosis)       Past Surgical History  Procedure Date  . Cholecystectomy 1/12  . Atrial ablation surgery 08/05/10    CTI ablation for atrial flutter by [facility]  . Cardioversion 12/07/2010     Successful direct current cardioversion with atrial fibrillation to normal sinus rhythm  . Cardiac catheterization   . Subtotal gastrectomy     in for   Chief Complaint  Patient presents with  . Chest Pain     HPI  [name]  is a 71 y.o. male, this 71 year old male with history of active fibrillation, on flecainide diltiazem and warfarin, and history of nonobstructive coronary disease, hypertension, diabetes, COPD, presents with complaint of chest pain, patient reports chest pain walking up from sleep today this describes it as pressure quality left side substernal was accompanied by shortness of breath, denies any palpitation,  diaphoresis, nausea, patient reports he took 4 baby aspirin when instructed over the phone by EMS, and when he was given sublingual nitroglycerin by EMS chest pain resolved, and first troponin was negative, EKG showing no significant changes from previous, currently chest pain free, normal sinus rhythm, comfortable.    Review of Systems    In addition to the HPI above,  No Fever-chills, No Headache, No changes with Vision or hearing, No problems swallowing food or Liquids, Complaints of chest pain accompanied with shortness of breath, currently resolved. No Abdominal pain, No Nausea or Vommitting, Bowel movements are regular, No Blood in stool or Urine, No dysuria, No new skin rashes or bruises, No new joints pains-aches,  No new weakness, tingling, numbness in any extremity, No recent weight gain or loss, No polyuria, polydypsia or polyphagia, No significant Mental Stressors.  A full 10 point Review of Systems was done, except as stated above, all other Review of Systems were negative.   Social History History  Substance Use Topics  .  Smoking status: Current Everyday Smoker -- 1.0 packs/day for 50 years    Types: Cigarettes  . Smokeless tobacco: Never Used  . Alcohol Use: No    Family History Family History  Problem Relation Age of Onset  . Cancer Mother   . Heart attack Father   . Heart attack Brother      Prior to Admission medications   Medication Sig Start Date End Date Taking? Authorizing Provider  aspirin EC 81 MG tablet Take 81 mg by mouth every morning.   Yes Historical Provider, MD  diltiazem (CARDIZEM CD) 240 MG 24 hr capsule Take 240 mg by mouth every morning.   Yes Historical Provider, MD  flecainide (TAMBOCOR) 150 MG tablet Take 150 mg by mouth 2 (two) times daily.   Yes Historical Provider, MD  insulin glargine (LANTUS SOLOSTAR) 100 UNIT/ML injection Inject 37 Units into the skin at bedtime.    Yes Historical Provider, MD  loratadine (CLARITIN) 10 MG  tablet Take 10 mg by mouth every morning.    Yes Historical Provider, MD  metFORMIN (GLUCOPHAGE) 1000 MG tablet Take 1,000 mg by mouth 2 (two) times daily with a meal.    Yes Historical Provider, MD  metoprolol (TOPROL-XL) 50 MG 24 hr tablet Take 200 mg by mouth at bedtime.    Yes Historical Provider, MD  nitroGLYCERIN (NITROSTAT) 0.4 MG SL tablet Place 0.4 mg under the tongue every 5 (five) minutes x 3 doses as needed. For chest pain.   Yes Historical Provider, MD  pravastatin (PRAVACHOL) 20 MG tablet Take 20 mg by mouth every morning.    Yes Historical Provider, MD  warfarin (COUMADIN) 5 MG tablet Take 5-7.5 mg by mouth at bedtime. 1.5 tablets on Sunday, Tuesday, and Thursday. 1 tablet on all other days   Yes Historical Provider, MD    No Known Allergies  Physical Exam  Vitals  Blood pressure 112/56, pulse 53, temperature 97.8 F (36.6 C), temperature source Oral, resp. rate 18, SpO2 96.00%.   1. General obese male lying in bed in NAD,    2. Normal affect and insight, Not Suicidal or Homicidal, Awake Alert, Oriented X 3.  3. No F.N deficits, ALL C.Nerves Intact, Strength 5/5 all 4 extremities, Sensation intact all 4 extremities, Plantars down going.  4. Ears and Eyes appear Normal, Conjunctivae clear, PERRLA. Moist Oral Mucosa.  5. Supple Neck, No JVD, No cervical lymphadenopathy appriciated, No Carotid Bruits.  6. Symmetrical Chest wall movement, Good air movement bilaterally, CTAB.  7. RRR, No Gallops, Rubs or Murmurs, No Parasternal Heave.  8. Positive Bowel Sounds, Abdomen Soft, Non tender, No organomegaly appriciated,No rebound -guarding or rigidity.  9.  No Cyanosis, Normal Skin Turgor, No Skin Rash or Bruise.  10. Good muscle tone,  joints appear normal , no effusions, Normal ROM.  11. No Palpable Lymph Nodes in Neck or Axillae    Data Review  CBC  Lab 12/04/11 0459 12/04/11 0450  WBC -- 7.7  HGB 12.2* 10.5*  HCT 36.0* 35.7*  PLT -- 298  MCV -- 70.6*  MCH  -- 20.8*  MCHC -- 29.4*  RDW -- 17.2*  LYMPHSABS -- 2.2  MONOABS -- 0.7  EOSABS -- 0.2  BASOSABS -- 0.1  BANDABS -- --   ------------------------------------------------------------------------------------------------------------------  Chemistries   Lab 12/04/11 0459 12/04/11 0450  NA 139 136  K 4.1 3.9  CL 102 101  CO2 -- 25  GLUCOSE 133* 139*  BUN 13 14  CREATININE 1.10 1.08  CALCIUM --  8.9  MG -- --  AST -- 16  ALT -- 20  ALKPHOS -- 91  BILITOT -- 0.2*   ------------------------------------------------------------------------------------------------------------------ CrCl is unknown because both a height and weight (above a minimum accepted value) are required for this calculation. ------------------------------------------------------------------------------------------------------------------ No results found for this basename: TSH,T4TOTAL,FREET3,T3FREE,THYROIDAB in the last 72 hours   Coagulation profile  Lab 12/04/11 0534  INR 1.95*  PROTIME --   ------------------------------------------------------------------------------------------------------------------- No results found for this basename: DDIMER:2 in the last 72 hours -------------------------------------------------------------------------------------------------------------------  Cardiac Enzymes No results found for this basename: CK:3,CKMB:3,TROPONINI:3,MYOGLOBIN:3 in the last 168 hours ------------------------------------------------------------------------------------------------------------------ No components found with this basename: POCBNP:3   ---------------------------------------------------------------------------------------------------------------  Urinalysis    Component Value Date/Time   COLORURINE YELLOW 10/09/2011 0846   APPEARANCEUR CLEAR 10/09/2011 0846   LABSPEC 1.025 10/09/2011 0846   PHURINE 5.5 10/09/2011 0846   GLUCOSEU NEGATIVE 10/09/2011 0846   HGBUR NEGATIVE 10/09/2011  0846   BILIRUBINUR NEGATIVE 10/09/2011 0846   KETONESUR NEGATIVE 10/09/2011 0846   PROTEINUR NEGATIVE 10/09/2011 0846   UROBILINOGEN 0.2 10/09/2011 0846   NITRITE NEGATIVE 10/09/2011 0846   LEUKOCYTESUR TRACE* 10/09/2011 0846    ----------------------------------------------------------------------------------------------------------------     Imaging results:   Dg Chest 2 View  12/04/2011  *RADIOLOGY REPORT*  Clinical Data: Chest pain.  Shortness of breath.  Cough.  Weakness.  CHEST - 2 VIEW  Comparison: 10/09/2011.  Findings: Normal heart size and pulmonary vascularity.  Suggestion of emphysematous changes and scattered fibrosis in the lungs. Nodular opacity in the left mid lung as seen previously appears to have an annular appearance today and may be due to scarring. Nodular opacity projected over the posterior lumbar spine likely represents hypertrophic change and is stable.  No focal airspace consolidation.  No blunting of costophrenic angles.  No pneumothorax.  Mediastinal contours appear intact.  Degenerative changes throughout the thoracic spine.  IMPRESSION: Stable appearance of the chest since previous study.  No evidence of active pulmonary disease.   Original Report Authenticated By: Marlon Pel, M.D.     My personal review of EKG: Rhythm NSR, Rate  61 BPM with no significant changes from last EKG in July 2000   Assessment & Plan  Active Problems:  Chest pain  Hypertension  Chronic diastolic congestive heart failure  CAD (coronary artery disease)  Atrial fibrillation    1. chest pain, patient is not to have history of coronary artery disease, chest pain free, received 324 mg of aspirin, as well on statin, aspirin, beta blockers, will cycle troponins, first one is negative, no significant EKG changes, we'll have sublingual nitroglycerin when necessary and morphine when necessary for chest pain,  2. Hypertension, appears to be well controlled, continue her  3. Axial  fibrillation, currently normal sinus rhythm, on warfarin for anticoagulation INR 1.95, we'll continue warfarin per pharmacy to dose, we'll continue with the flecainide, beta blockers and Cardizem  4. Chronic diastolic CHF, appears to be euvolemic, not in exacerbation  5. CAD, on statin, aspirin, beta blockers,  6. Diabetes mellitus, will continue with Lantus at a lower, will start insulin sliding scale, will hold metformin   DVT Prophylaxis  on warfarin  AM Labs Ordered, also please review Full Orders  Family Communication: Admission, patients condition and plan of care including tests being ordered have been discussed with the patient who indicate understanding and agree with the plan and Code Status.  Code Status Full  Disposition Plan: home  Time spent in minutes :  Condition GUARDED  Takela Varden  M.D on 12/04/2011 at 6:32 AM   [facility] Office  [phone]

## 2011-12-04 NOTE — Progress Notes (Signed)
Utilization Review Completed.  

## 2011-12-04 NOTE — ED Notes (Signed)
Patient is resting comfortably. 

## 2011-12-04 NOTE — Progress Notes (Signed)
Pt in junctional rhythm.  Dr. Betti Cruz text/paged.  Will continue to monitor pt.

## 2011-12-04 NOTE — Progress Notes (Signed)
ANTICOAGULATION CONSULT NOTE - Initial Consult  Pharmacy Consult for Coumadin Indication: atrial fibrillation  No Known Allergies  Patient Measurements:    Vital Signs: Temp: 97.8 F (36.6 C) (09/09 0425) Temp src: Oral (09/09 0425) BP: 101/54 mmHg (09/09 0630) Pulse Rate: 51  (09/09 0630)  Labs:  Basename 12/04/11 0534 12/04/11 0459 12/04/11 0450  HGB -- 12.2* 10.5*  HCT -- 36.0* 35.7*  PLT -- -- 298  APTT -- -- --  LABPROT 22.6* -- --  INR 1.95* -- --  HEPARINUNFRC -- -- --  CREATININE -- 1.10 1.08  CKTOTAL -- -- --  CKMB -- -- --  TROPONINI -- -- --    The CrCl is unknown because both a height and weight (above a minimum accepted value) are required for this calculation.   Medical History: Past Medical History  Diagnosis Date  . Cholecystitis 04/22/2010    acute s/p cholecystectomy  . Gallstone pancreatitis 04/22/10  . Atrial flutter     s/p CTI ablation by [facility] 08/05/10  . Diabetes mellitus type II   . Hyperlipidemia   . HTN (hypertension)   . Diastolic CHF, chronic     preserved EF  . Atrial fibrillation     with rapid ventricular response status post direct current cardioversion on December 07, 2010, to normal sinus rhythm  . Tobacco abuse   . Cervical radiculopathy   . Cervical radiculopathy   . Coronary artery disease     Est. EF of 55% -- History of nonobstructive coronary artery disease by cath in January 2012  . Heart palpitations   . History of MI (myocardial infarction)   . History of DVT (deep vein thrombosis)     Medications:  Scheduled:    . aspirin  324 mg Oral Once  . aspirin EC  81 mg Oral BH-q7a  . diltiazem  240 mg Oral BH-q7a  . flecainide  150 mg Oral BID  . insulin aspart  0-15 Units Subcutaneous TID WC  . insulin glargine  15 Units Subcutaneous QHS  . loratadine  10 mg Oral BH-q7a  . metoprolol succinate  200 mg Oral QHS  . simvastatin  10 mg Oral q1800  . sodium chloride  3 mL Intravenous Q12H    Assessment: 71 yo male  with h/o atrial fibrillation admitted with chest pain. Pharmacy consulted to manage Coumadin. Home Coumadin regimen is 5mg  daily, except 7.5mg  on Sunday, Tuesday and Thursday. INR (1.95) is slightly below-goal.   Goal of Therapy:  INR 2-3 Monitor platelets by anticoagulation protocol: Yes   Plan:  1. Coumadin 7.5mg  po today. 2. Daily PT / INR.  Emeline Gins 12/04/2011,6:49 AM

## 2011-12-04 NOTE — Consult Note (Signed)
Reason for Consult: chest pain, Atrial fib    Referring Physician: Dr. Clarita Leber is an 71 y.o. male.    Chief Complaint:  Chest pain  HPI: [name] is a 71 y.o. male,  with history of active fibrillation, on flecainide, diltiazem, metoprolol and warfarin, and history of nonobstructive coronary disease, hypertension, diabetes, COPD, presents with complaint of chest pain.  He reported chest pain waking up from sleep today.  Described it as pressure quality left side, substernal, was accompanied by shortness of breath, denies any palpitation, diaphoresis, nausea.  He did state it also went into his shoulder, though he does have arthritis in both shoulders.  Patient reported he took 4 baby aspirin when instructed over the phone by EMS, and when he was given sublingual nitroglycerin by EMS chest pain resolved, and first troponin was negative, EKG showing no significant changes from previous.   Pt. Was admitted and cardiac enzymes ordered.  Troponin POC negative.    Cardiac cath 04/11/10 LAD with 20-30% stenosis.  Normal LV function.    He has history of A. Flutter ablation 07/2010 with Dr. Johney Frame, with recurrent PAF. Brief episodes.  Anticoagulated with coumadin.  + tobacco use.  Hx. Diastolic dysfunction.  Past Medical History  Diagnosis Date  . Cholecystitis 04/22/2010    acute s/p cholecystectomy  . Gallstone pancreatitis 04/22/10  . Atrial flutter     s/p CTI ablation by [facility] 08/05/10  . Diabetes mellitus type II   . Hyperlipidemia   . HTN (hypertension)   . Diastolic CHF, chronic     preserved EF  . Atrial fibrillation     with rapid ventricular response status post direct current cardioversion on December 07, 2010, to normal sinus rhythm  . Tobacco abuse   . Cervical radiculopathy   . Cervical radiculopathy   . Coronary artery disease     Est. EF of 55% -- History of nonobstructive coronary artery disease by cath in January 2012  . Heart palpitations   . History of  MI (myocardial infarction)   . History of DVT (deep vein thrombosis)     Past Surgical History  Procedure Date  . Cholecystectomy 1/12  . Atrial ablation surgery 08/05/10    CTI ablation for atrial flutter by [facility]  . Cardioversion 12/07/2010     Successful direct current cardioversion with atrial fibrillation to normal sinus rhythm  . Cardiac catheterization   . Subtotal gastrectomy     Family History  Problem Relation Age of Onset  . Cancer Mother   . Heart attack Father   . Heart attack Brother    Social History:  reports that he has been smoking Cigarettes.  He has a 25 pack-year smoking history. He has never used smokeless tobacco. He reports that he does not drink alcohol or use illicit drugs.  Allergies: No Known Allergies  Medications Prior to Admission  Medication Sig Dispense Refill  . aspirin EC 81 MG tablet Take 81 mg by mouth every morning.      . diltiazem (CARDIZEM CD) 240 MG 24 hr capsule Take 240 mg by mouth every morning.      . flecainide (TAMBOCOR) 150 MG tablet Take 150 mg by mouth 2 (two) times daily.      . insulin glargine (LANTUS SOLOSTAR) 100 UNIT/ML injection Inject 37 Units into the skin at bedtime.       [name] loratadine (CLARITIN) 10 MG tablet Take 10 mg by mouth every morning.       [name]  metFORMIN (GLUCOPHAGE) 1000 MG tablet Take 1,000 mg by mouth 2 (two) times daily with a meal.       . metoprolol (TOPROL-XL) 50 MG 24 hr tablet Take 200 mg by mouth at bedtime.       . nitroGLYCERIN (NITROSTAT) 0.4 MG SL tablet Place 0.4 mg under the tongue every 5 (five) minutes x 3 doses as needed. For chest pain.      . pravastatin (PRAVACHOL) 20 MG tablet Take 20 mg by mouth every morning.       . warfarin (COUMADIN) 5 MG tablet Take 5-7.5 mg by mouth at bedtime. 1.5 tablets on Sunday, Tuesday, and Thursday. 1 tablet on all other days        Results for orders placed during the hospital encounter of 12/04/11 (from the past 48 hour(s))  CBC WITH DIFFERENTIAL     Status:  Abnormal   Collection Time   12/04/11  4:50 AM      Component Value Range Comment   WBC 7.7  4.0 - 10.5 K/uL    RBC 5.06  4.22 - 5.81 MIL/uL    Hemoglobin 10.5 (*) 13.0 - 17.0 g/dL    HCT 62.1 (*) 30.8 - 52.0 %    MCV 70.6 (*) 78.0 - 100.0 fL    MCH 20.8 (*) 26.0 - 34.0 pg    MCHC 29.4 (*) 30.0 - 36.0 g/dL    RDW 65.7 (*) 84.6 - 15.5 %    Platelets 298  150 - 400 K/uL    Neutrophils Relative 58  43 - 77 %    Lymphocytes Relative 29  12 - 46 %    Monocytes Relative 9  3 - 12 %    Eosinophils Relative 3  0 - 5 %    Basophils Relative 1  0 - 1 %    Neutro Abs 4.5  1.7 - 7.7 K/uL    Lymphs Abs 2.2  0.7 - 4.0 K/uL    Monocytes Absolute 0.7  0.1 - 1.0 K/uL    Eosinophils Absolute 0.2  0.0 - 0.7 K/uL    Basophils Absolute 0.1  0.0 - 0.1 K/uL    RBC Morphology POLYCHROMASIA PRESENT     COMPREHENSIVE METABOLIC PANEL     Status: Abnormal   Collection Time   12/04/11  4:50 AM      Component Value Range Comment   Sodium 136  135 - 145 mEq/L    Potassium 3.9  3.5 - 5.1 mEq/L    Chloride 101  96 - 112 mEq/L    CO2 25  19 - 32 mEq/L    Glucose, Bld 139 (*) 70 - 99 mg/dL    BUN 14  6 - 23 mg/dL    Creatinine, Ser 9.62  0.50 - 1.35 mg/dL    Calcium 8.9  8.4 - 95.2 mg/dL    Total Protein 6.9  6.0 - 8.3 g/dL    Albumin 3.3 (*) 3.5 - 5.2 g/dL    AST 16  0 - 37 U/L    ALT 20  0 - 53 U/L    Alkaline Phosphatase 91  39 - 117 U/L    Total Bilirubin 0.2 (*) 0.3 - 1.2 mg/dL    GFR calc non Af Amer 67 (*) >90 mL/min    GFR calc Af Amer 77 (*) >90 mL/min   POCT I-STAT TROPONIN I     Status: Normal   Collection Time   12/04/11  4:58 AM  Component Value Range Comment   Troponin i, poc 0.00  0.00 - 0.08 ng/mL    Comment 3            POCT I-STAT, CHEM 8     Status: Abnormal   Collection Time   12/04/11  4:59 AM      Component Value Range Comment   Sodium 139  135 - 145 mEq/L    Potassium 4.1  3.5 - 5.1 mEq/L    Chloride 102  96 - 112 mEq/L    BUN 13  6 - 23 mg/dL    Creatinine, Ser 1.61  0.50 -  1.35 mg/dL    Glucose, Bld 096 (*) 70 - 99 mg/dL    Calcium, Ion 0.45  4.09 - 1.30 mmol/L    TCO2 23  0 - 100 mmol/L    Hemoglobin 12.2 (*) 13.0 - 17.0 g/dL    HCT 81.1 (*) 91.4 - 52.0 %   PROTIME-INR     Status: Abnormal   Collection Time   12/04/11  5:34 AM      Component Value Range Comment   Prothrombin Time 22.6 (*) 11.6 - 15.2 seconds    INR 1.95 (*) 0.00 - 1.49   GLUCOSE, CAPILLARY     Status: Abnormal   Collection Time   12/04/11  8:18 AM      Component Value Range Comment   Glucose-Capillary 121 (*) 70 - 99 mg/dL    Dg Chest 2 View  10/02/2954  *RADIOLOGY REPORT*  Clinical Data: Chest pain.  Shortness of breath.  Cough.  Weakness.  CHEST - 2 VIEW  Comparison: 10/09/2011.  Findings: Normal heart size and pulmonary vascularity.  Suggestion of emphysematous changes and scattered fibrosis in the lungs. Nodular opacity in the left mid lung as seen previously appears to have an annular appearance today and may be due to scarring. Nodular opacity projected over the posterior lumbar spine likely represents hypertrophic change and is stable.  No focal airspace consolidation.  No blunting of costophrenic angles.  No pneumothorax.  Mediastinal contours appear intact.  Degenerative changes throughout the thoracic spine.  IMPRESSION: Stable appearance of the chest since previous study.  No evidence of active pulmonary disease.   Original Report Authenticated By: Marlon Pel, M.D.     ROS: General:no colds or fevers Skin:no rashes or ulcers HEENT:no blurred vision, no double vision OZ:HYQMV pressure see HPI, has episodes every week or so, but this was the most significant HQI:ONGE SOB with pain GI:no diarrhea or constipation, no melena GU:no dysuria no hematuria XB:[id number] pains in shoulders and ankles Neuro:no lightheadedness, no dizziness, no syncope. Endo:+ Dm   Blood pressure 118/60, pulse 61, temperature 97.7 F (36.5 C), temperature source Oral, resp. rate 18, height 5\' 6"   (1.676 m), weight 97.07 kg (214 lb), SpO2 97.00%. PE: General:alert and oriented, Pleasant affect Skin:warm and dry HEENT:normocephalic, keeps his eyes squinted while talking Neck:no JVD, no bruits Heart:S1S2 RRR, no obvious murmur Lungs:Clear without rales, rhonchi or wheezes Abd:+ BS, soft non tender, do not palpate, liver spleen or masses Ext:no edema Neuro:alert and oriented X 3, MAE, follows commands    Assessment/Plan Principal Problem:  *Chest pain Active Problems:  Hypertension  Chronic diastolic congestive heart failure  CAD (coronary artery disease)  Atrial fibrillation  PLAN: recheck EKG, ? Junctional rhythm.  Also check CKMB.  Poor baseline on strips, could be junct. Rhythm but prob S. Huston Foley.  No further chest pain.  Relief with 1 NTG. ?  nuc study either IP or Outpatient. MD to see.  INR is 1.95   INGOLD,LAURA R 12/04/2011, 10:45 AM     Agree with note written by Nada Boozer RNP  Non critical CAD by cath 18 months ago. S/P AF ablation by DR. Allred now in SB with first degree AVB. Admitted with SSCP responsive to SL NTG. Pain awakened him from sleep. EKG w/o acute changes. ENZ neg. Exam benign. Doubt progression of disease in short time pertiod but worry about being awakened by CP. Will get IP lexiscan myoview tomorrow. INR sub theraputic.  Runell Gess 12/04/2011 4:58 PM

## 2011-12-04 NOTE — ED Provider Notes (Signed)
History     CSN: [id number]  Arrival date & time 12/04/11  0417   First MD Initiated Contact with Patient 12/04/11 0425      Chief Complaint  Patient presents with  . Chest Pain    (Consider location/radiation/quality/duration/timing/severity/associated sxs/prior treatment) HPI Hx per PT. L side chest pressure tonight, mod in severity, SOB with pain, called EMS and pain relieved with NTG. Has afib and takes coumadin, currently pain free. No h/o MI. No diaphoresis, no known aggrevating factors. Followed by DR Janalyn Harder [facility] Past Medical History  Diagnosis Date  . Cholecystitis 04/22/2010    acute s/p cholecystectomy  . Gallstone pancreatitis 04/22/10  . Atrial flutter     s/p CTI ablation by [facility] 08/05/10  . Diabetes mellitus type II   . Hyperlipidemia   . HTN (hypertension)   . Diastolic CHF, chronic     preserved EF  . Atrial fibrillation     with rapid ventricular response status post direct current cardioversion on December 07, 2010, to normal sinus rhythm  . Tobacco abuse   . Cervical radiculopathy   . Cervical radiculopathy   . Coronary artery disease     Est. EF of 55% -- History of nonobstructive coronary artery disease by cath in January 2012  . Heart palpitations   . History of MI (myocardial infarction)   . History of DVT (deep vein thrombosis)     Past Surgical History  Procedure Date  . Cholecystectomy 1/12  . Atrial ablation surgery 08/05/10    CTI ablation for atrial flutter by [facility]  . Cardioversion 12/07/2010     Successful direct current cardioversion with atrial fibrillation to normal sinus rhythm  . Cardiac catheterization   . Subtotal gastrectomy     Family History  Problem Relation Age of Onset  . Cancer Mother   . Heart attack Father   . Heart attack Brother     History  Substance Use Topics  . Smoking status: Current Everyday Smoker -- 1.0 packs/day for 50 years    Types: Cigarettes  . Smokeless tobacco: Never Used  . Alcohol Use: No       Review of Systems  Constitutional: Negative for fever and chills.  HENT: Negative for neck pain and neck stiffness.   Eyes: Negative for pain.  Respiratory: Negative for shortness of breath.   Cardiovascular: Positive for chest pain.  Gastrointestinal: Negative for abdominal pain.  Genitourinary: Negative for dysuria.  Musculoskeletal: Negative for back pain.  Skin: Negative for rash.  Neurological: Negative for headaches.  All other systems reviewed and are negative.    Allergies  Review of patient's allergies indicates no known allergies.  Home Medications   Current Outpatient Rx  Name Route Sig Dispense Refill  . ASPIRIN EC 81 MG PO TBEC Oral Take 81 mg by mouth every morning.    [name] DILTIAZEM HCL ER COATED BEADS 240 MG PO CP24 Oral Take 240 mg by mouth every morning.    [name] FLECAINIDE ACETATE 150 MG PO TABS Oral Take 150 mg by mouth 2 (two) times daily.    . INSULIN GLARGINE 100 UNIT/ML [place] SOLN Subcutaneous Inject 37 Units into the skin at bedtime.     [name] LORATADINE 10 MG PO TABS Oral Take 10 mg by mouth every morning.     [name] METFORMIN HCL 1000 MG PO TABS Oral Take 1,000 mg by mouth 2 (two) times daily with a meal.     . METOPROLOL SUCCINATE ER 50 MG PO  TB24 Oral Take 200 mg by mouth at bedtime.     [name] NITROGLYCERIN 0.4 MG SL SUBL Sublingual Place 0.4 mg under the tongue every 5 (five) minutes x 3 doses as needed. For chest pain.    [name] PRAVASTATIN SODIUM 20 MG PO TABS Oral Take 20 mg by mouth every morning.     . WARFARIN SODIUM 5 MG PO TABS Oral Take 5-7.5 mg by mouth at bedtime. 1.5 tablets on Sunday, Tuesday, and Thursday. 1 tablet on all other days      BP 104/55  Temp 97.8 F (36.6 C) (Oral)  Resp 18  SpO2 99%  Physical Exam  Constitutional: He is oriented to person, place, and time. He appears well-developed and well-nourished.  HENT:  Head: Normocephalic and atraumatic.  Eyes: Conjunctivae and EOM are normal. Pupils are equal, round, and reactive to light.   Neck: Trachea normal. Neck supple. No thyromegaly present.  Cardiovascular: Normal rate, regular rhythm, S1 normal, S2 normal and normal pulses.     No systolic murmur is present   No diastolic murmur is present  Pulses:      Radial pulses are 2+ on the right side, and 2+ on the left side.  Pulmonary/Chest: Effort normal and breath sounds normal. He has no wheezes. He has no rhonchi. He has no rales. He exhibits no tenderness.  Abdominal: Soft. Normal appearance and bowel sounds are normal. There is no tenderness. There is no CVA tenderness and negative Murphy's sign.  Musculoskeletal:       BLE:s Calves nontender, no cords or erythema, negative Homans sign  Neurological: He is alert and oriented to person, place, and time. He has normal strength. No cranial nerve deficit or sensory deficit. GCS eye subscore is 4. GCS verbal subscore is 5. GCS motor subscore is 6.  Skin: Skin is warm and dry. No rash noted. He is not diaphoretic.  Psychiatric: His speech is normal.       Cooperative and appropriate    ED Course  Procedures (including critical care time)  Results for orders placed during the hospital encounter of 12/04/11  CBC WITH DIFFERENTIAL      Component Value Range   WBC 7.7  4.0 - 10.5 K/uL   RBC 5.06  4.22 - 5.81 MIL/uL   Hemoglobin 10.5 (*) 13.0 - 17.0 g/dL   HCT 16.1 (*) 09.6 - 04.5 %   MCV 70.6 (*) 78.0 - 100.0 fL   MCH 20.8 (*) 26.0 - 34.0 pg   MCHC 29.4 (*) 30.0 - 36.0 g/dL   RDW 40.9 (*) 81.1 - 91.4 %   Platelets 298  150 - 400 K/uL   Neutrophils Relative 58  43 - 77 %   Lymphocytes Relative 29  12 - 46 %   Monocytes Relative 9  3 - 12 %   Eosinophils Relative 3  0 - 5 %   Basophils Relative 1  0 - 1 %   Neutro Abs 4.5  1.7 - 7.7 K/uL   Lymphs Abs 2.2  0.7 - 4.0 K/uL   Monocytes Absolute 0.7  0.1 - 1.0 K/uL   Eosinophils Absolute 0.2  0.0 - 0.7 K/uL   Basophils Absolute 0.1  0.0 - 0.1 K/uL   RBC Morphology POLYCHROMASIA PRESENT    COMPREHENSIVE METABOLIC  PANEL      Component Value Range   Sodium 136  135 - 145 mEq/L   Potassium 3.9  3.5 - 5.1 mEq/L   Chloride 101  96 -  112 mEq/L   CO2 25  19 - 32 mEq/L   Glucose, Bld 139 (*) 70 - 99 mg/dL   BUN 14  6 - 23 mg/dL   Creatinine, Ser 0.34  0.50 - 1.35 mg/dL   Calcium 8.9  8.4 - 74.2 mg/dL   Total Protein 6.9  6.0 - 8.3 g/dL   Albumin 3.3 (*) 3.5 - 5.2 g/dL   AST 16  0 - 37 U/L   ALT 20  0 - 53 U/L   Alkaline Phosphatase 91  39 - 117 U/L   Total Bilirubin 0.2 (*) 0.3 - 1.2 mg/dL   GFR calc non Af Amer 67 (*) >90 mL/min   GFR calc Af Amer 77 (*) >90 mL/min  POCT I-STAT, CHEM 8      Component Value Range   Sodium 139  135 - 145 mEq/L   Potassium 4.1  3.5 - 5.1 mEq/L   Chloride 102  96 - 112 mEq/L   BUN 13  6 - 23 mg/dL   Creatinine, Ser 5.95  0.50 - 1.35 mg/dL   Glucose, Bld 638 (*) 70 - 99 mg/dL   Calcium, Ion 7.56  4.33 - 1.30 mmol/L   TCO2 23  0 - 100 mmol/L   Hemoglobin 12.2 (*) 13.0 - 17.0 g/dL   HCT 29.5 (*) 18.8 - 41.6 %  POCT I-STAT TROPONIN I      Component Value Range   Troponin i, poc 0.00  0.00 - 0.08 ng/mL   Comment 3           PROTIME-INR      Component Value Range   Prothrombin Time 22.6 (*) 11.6 - 15.2 seconds   INR 1.95 (*) 0.00 - 1.49  TROPONIN I      Component Value Range   Troponin I <0.30  <0.30 ng/mL  TROPONIN I      Component Value Range   Troponin I <0.30  <0.30 ng/mL  GLUCOSE, CAPILLARY      Component Value Range   Glucose-Capillary 121 (*) 70 - 99 mg/dL  CK TOTAL AND CKMB      Component Value Range   Total CK 145  7 - 232 U/L   CK, MB 2.2  0.3 - 4.0 ng/mL   Relative Index 1.5  0.0 - 2.5  GLUCOSE, CAPILLARY      Component Value Range   Glucose-Capillary 134 (*) 70 - 99 mg/dL   Comment 1 Notify RN    GLUCOSE, CAPILLARY      Component Value Range   Glucose-Capillary 123 (*) 70 - 99 mg/dL   Comment 1 Notify RN    GLUCOSE, CAPILLARY      Component Value Range   Glucose-Capillary 141 (*) 70 - 99 mg/dL   Comment 1 Notify RN     Comment 2  Documented in Chart    GLUCOSE, CAPILLARY      Component Value Range   Glucose-Capillary 121 (*) 70 - 99 mg/dL   Dg Chest 2 View  6/0/6301  *RADIOLOGY REPORT*  Clinical Data: Chest pain.  Shortness of breath.  Cough.  Weakness.  CHEST - 2 VIEW  Comparison: 10/09/2011.  Findings: Normal heart size and pulmonary vascularity.  Suggestion of emphysematous changes and scattered fibrosis in the lungs. Nodular opacity in the left mid lung as seen previously appears to have an annular appearance today and may be due to scarring. Nodular opacity projected over the posterior lumbar spine likely represents hypertrophic change and is  stable.  No focal airspace consolidation.  No blunting of costophrenic angles.  No pneumothorax.  Mediastinal contours appear intact.  Degenerative changes throughout the thoracic spine.  IMPRESSION: Stable appearance of the chest since previous study.  No evidence of active pulmonary disease.   Original Report Authenticated By: Marlon Pel, M.D.      Date: 12/04/2011  Rate: 61  Rhythm: normal sinus rhythm  QRS Axis: left  Intervals: PR prolonged  ST/T Wave abnormalities: nonspecific ST changes  Conduction Disutrbances:first-degree A-V block   Narrative Interpretation:   Old EKG Reviewed: unchanged  ASA. NTG PTA. Pain free in ED.   MED c/s for admit.  MDM   CP  VS, nursing notes and old records reviewed.   Evaluated with ECG, imaging and labs as above. MED admit        Sunnie Nielsen, MD 12/05/11 [phone]

## 2011-12-04 NOTE — ED Notes (Signed)
MD at bedside. 

## 2011-12-04 NOTE — ED Notes (Addendum)
Patient transported to x-ray. ?

## 2011-12-05 ENCOUNTER — Observation Stay (HOSPITAL_COMMUNITY): Payer: Medicare Other

## 2011-12-05 DIAGNOSIS — IMO0002 Reserved for concepts with insufficient information to code with codable children: Secondary | ICD-10-CM

## 2011-12-05 DIAGNOSIS — E1165 Type 2 diabetes mellitus with hyperglycemia: Secondary | ICD-10-CM

## 2011-12-05 DIAGNOSIS — I2510 Atherosclerotic heart disease of native coronary artery without angina pectoris: Secondary | ICD-10-CM

## 2011-12-05 LAB — CBC
MCH: 20.9 pg — ABNORMAL LOW (ref 26.0–34.0)
MCHC: 29.5 g/dL — ABNORMAL LOW (ref 30.0–36.0)
MCV: 70.8 fL — ABNORMAL LOW (ref 78.0–100.0)
Platelets: 296 10*3/uL (ref 150–400)
RBC: 5.17 MIL/uL (ref 4.22–5.81)
RDW: 17.1 % — ABNORMAL HIGH (ref 11.5–15.5)

## 2011-12-05 LAB — BASIC METABOLIC PANEL
BUN: 15 mg/dL (ref 6–23)
CO2: 28 mEq/L (ref 19–32)
Calcium: 8.8 mg/dL (ref 8.4–10.5)
Creatinine, Ser: 1.09 mg/dL (ref 0.50–1.35)
GFR calc non Af Amer: 66 mL/min — ABNORMAL LOW (ref >90)
Glucose, Bld: 130 mg/dL — ABNORMAL HIGH (ref 70–99)

## 2011-12-05 LAB — GLUCOSE, CAPILLARY: Glucose-Capillary: 168 mg/dL — ABNORMAL HIGH (ref 70–99)

## 2011-12-05 LAB — PROTIME-INR: Prothrombin Time: 21.7 seconds — ABNORMAL HIGH (ref 11.6–15.2)

## 2011-12-05 MED ORDER — THYROTROPIN ALFA 1.1 MG IM SOLR: 0.9000 mg | INTRAMUSCULAR | Status: DC

## 2011-12-05 MED ORDER — TECHNETIUM TC 99M TETROFOSMIN IV KIT
30.0000 | PACK | Freq: Once | INTRAVENOUS | Status: AC | PRN
  Administered 2011-12-05: 30 via INTRAVENOUS

## 2011-12-05 MED ORDER — WARFARIN SODIUM 10 MG PO TABS
10.0000 mg | ORAL_TABLET | Freq: Once | ORAL | Status: DC
  Filled 2011-12-05: qty 1

## 2011-12-05 MED ORDER — TECHNETIUM TC 99M TETROFOSMIN IV KIT
10.0000 | PACK | Freq: Once | INTRAVENOUS | Status: AC | PRN
  Administered 2011-12-05: 10 via INTRAVENOUS

## 2011-12-05 MED ORDER — REGADENOSON 0.4 MG/5ML IV SOLN
0.4000 mg | Freq: Once | INTRAVENOUS | Status: DC
  Administered 2011-12-05: 0.4 mg via INTRAVENOUS

## 2011-12-05 NOTE — Progress Notes (Signed)
Patient discharged with all belongings and verbalizes understanding of all discharge instructions. Anastasia Fiedler RN

## 2011-12-05 NOTE — Progress Notes (Signed)
Subjective: No further chest pain, no complaints  Objective: Vital signs in last 24 hours: Temp:  [97.7 F (36.5 C)-98.6 F (37 C)] 98.6 F (37 C) (09/10 0530) Pulse Rate:  [56-84] 84  (09/10 0530) Resp:  [18-20] 19  (09/10 0530) BP: (106-134)/(48-96) 117/72 mmHg (09/10 1009) SpO2:  [94 %-100 %] 100 % (09/10 0530) Weight:  [96.5 kg (212 lb 11.9 oz)] 96.5 kg (212 lb 11.9 oz) (09/10 0530) Weight change:  Last BM Date: 12/04/11 Intake/Output from previous day: +390 09/09 0701 - 09/10 0700 In: 700 [P.O.:700] Out: 500 [Urine:500] Intake/Output this shift:    PE: General:Alert and oriented Heart:S1S2 RRR Lungs:clear  Abd:+ BS soft non tender Ext:no edema Neuro:alert and oriented X 3   Lab Results:  Basename 12/05/11 0655 12/04/11 0459 12/04/11 0450  WBC 7.4 -- 7.7  HGB 10.8* 12.2* --  HCT 36.6* 36.0* --  PLT 296 -- 298   BMET  Basename 12/05/11 0655 12/04/11 0459 12/04/11 0450  NA 137 139 --  K 4.2 4.1 --  CL 103 102 --  CO2 28 -- 25  GLUCOSE 130* 133* --  BUN 15 13 --  CREATININE 1.09 1.10 --  CALCIUM 8.8 -- 8.9    Basename 12/04/11 1724 12/04/11 1100  TROPONINI <0.30 <0.30    Lab Results  Component Value Date   CHOL  Value: 151        ATP III CLASSIFICATION:  <200     mg/dL   Desirable  161-096  mg/dL   Borderline High  >=045    mg/dL   High        07/03/8117   HDL 37* 04/09/2010   LDLCALC  Value: 77        Total Cholesterol/HDL:CHD Risk Coronary Heart Disease Risk Table                     Men   Women  1/2 Average Risk   3.4   3.3  Average Risk       5.0   4.4  2 X Average Risk   9.6   7.1  3 X Average Risk  23.4   11.0        Use the calculated Patient Ratio above and the CHD Risk Table to determine the patient's CHD Risk.        ATP III CLASSIFICATION (LDL):  <100     mg/dL   Optimal  147-829  mg/dL   Near or Above                    Optimal  130-159  mg/dL   Borderline  562-130  mg/dL   High  >865     mg/dL   Very High 7/84/6962   TRIG 185* 04/09/2010   CHOLHDL 4.1 04/09/2010   Lab Results  Component Value Date   HGBA1C  Value: 8.2 (NOTE)                                                                       According to the ADA Clinical Practice Recommendations for 2011, when HbA1c is used as a screening test:   >=6.5%   Diagnostic of Diabetes Mellitus           (  if abnormal result  is confirmed)  5.7-6.4%   Increased risk of developing Diabetes Mellitus  References:Diagnosis and Classification of Diabetes Mellitus,Diabetes Care,2011,34(Suppl 1):S62-S69 and Standards of Medical Care in         Diabetes - 2011,Diabetes Care,2011,34  (Suppl 1):S11-S61.* 08/17/2010     Lab Results  Component Value Date   TSH 3.600 07/15/2011    Hepatic Function Panel  Basename 12/04/11 0450  PROT 6.9  ALBUMIN 3.3*  AST 16  ALT 20  ALKPHOS 91  BILITOT 0.2*  BILIDIR --  IBILI --     EKG: Orders placed during the hospital encounter of 12/04/11  . EKG 12-LEAD  . EKG 12-LEAD  . EKG 12-LEAD  . EKG 12-LEAD  . EKG 12-LEAD  . EKG 12-LEAD    Studies/Results: Dg Chest 2 View  12/04/2011  *RADIOLOGY REPORT*  Clinical Data: Chest pain.  Shortness of breath.  Cough.  Weakness.  CHEST - 2 VIEW  Comparison: 10/09/2011.  Findings: Normal heart size and pulmonary vascularity.  Suggestion of emphysematous changes and scattered fibrosis in the lungs. Nodular opacity in the left mid lung as seen previously appears to have an annular appearance today and may be due to scarring. Nodular opacity projected over the posterior lumbar spine likely represents hypertrophic change and is stable.  No focal airspace consolidation.  No blunting of costophrenic angles.  No pneumothorax.  Mediastinal contours appear intact.  Degenerative changes throughout the thoracic spine.  IMPRESSION: Stable appearance of the chest since previous study.  No evidence of active pulmonary disease.   Original Report Authenticated By: Marlon Pel, M.D.     Medications: I have reviewed the  patient's current medications.    [name] aspirin  324 mg Oral Once  . aspirin EC  81 mg Oral Daily  . diltiazem  240 mg Oral Daily  . flecainide  150 mg Oral BID  . insulin aspart  0-15 Units Subcutaneous TID WC  . insulin glargine  15 Units Subcutaneous QHS  . loratadine  10 mg Oral Daily  . metoprolol succinate  200 mg Oral QHS  . regadenoson  0.4 mg Intravenous Once  . simvastatin  10 mg Oral q1800  . sodium chloride  3 mL Intravenous Q12H  . thyrotropin alfa  0.9 mg Intramuscular to XRAY  . warfarin  7.5 mg Oral ONCE-1800  . Warfarin - Pharmacist Dosing Inpatient   Does not apply q1800   Assessment/Plan: Principal Problem:  *Chest pain Active Problems:  Hypertension  Chronic diastolic congestive heart failure  CAD (coronary artery disease)  Atrial fibrillation  PLAN: negative MI, just completed lexiscan myoview without complications.  No further chest pain, if nuc study negative would consider GI source.  Nuc resoults to follow.  INR sub therapeutic.   LOS: 1 day   INGOLD,LAURA R 12/05/2011, 10:21 AM

## 2011-12-05 NOTE — Discharge Summary (Addendum)
Physician Discharge Summary  [name] NFA:[id number] DOB: 1940-09-03 DOA: 12/04/2011  PCP: [name], MD  Admit date: 12/04/2011 Discharge date: 12/05/2011  Recommendations for Outpatient Follow-up:  1. Followup with PCP for outpatient referral to GI for noncardiac chest pain.   Discharge Diagnoses:  Principal Problem:  *Chest pain Active Problems:  Hypertension  Chronic diastolic congestive heart failure  CAD (coronary artery disease)  Atrial fibrillation  DM (diabetes mellitus), type 2, uncontrolled with complications  Discharge Condition: Stable  Diet recommendation: Diabetic diet.  Filed Weights   12/04/11 0738 12/05/11 0530  Weight: 97.07 kg (214 lb) 96.5 kg (212 lb 11.9 oz)    History of present illness:  [name] is a 71 y.o. male, this 71 year old male with history of active fibrillation, on flecainide diltiazem and warfarin, and history of nonobstructive coronary disease, hypertension, diabetes, COPD, presented with complaint of chest pain on 12/04/2011.   Hospital Course:  Chest pain  Etiology unclear, per cath in Jan of 2012 has no occlusive coronary artery disease. Patient is chest pain free, continue aspirin 81 mg, continue statin, and metoprolol. Cycle troponins to rule out ACS. Continue sublingual nitroglycerin when necessary and morphine when necessary for chest pain.  Patient was ruled out for acute coronary syndrome with negative troponins.  Seen by [facility], cardiology recommended, myocardial perfusion scan which on 12/05/2011 showed no evidence for infarction or ischemia, ejection fraction 50%.  Patient was discharged with outpatient followup with primary care physician for outpatient GI evaluation for possible etiology for noncardiac chest pain.   Hypertension  Stable, continue home medications.   Axial fibrillation  Rate controllted. Coumadin per pharmacy. Continue with the flecainide, beta blockers and Cardizem.   Chronic diastolic CHF    Appears to be euvolemic, not in exacerbation.   Diabetes mellitus  Continue with Lantus at a lower. Sliding scale insulin, will hold metformin. Likely can be resumed at discharge.   Procedures:  As above.  Consultations:  [facility], Dr. Rennis Golden and Dr. Allyson Sabal.  Discharge Exam: Filed Vitals:   12/05/11 1007 12/05/11 1009 12/05/11 1205 12/05/11 1326  BP: 124/72 117/72 128/76 127/63  Pulse:    65  Temp:    98.6 F (37 C)  TempSrc:    Oral  Resp:      Height:      Weight:      SpO2:    100%   Discharge Instructions  Discharge Orders    Future Orders Please Complete By Expires   Diet Carb Modified      Increase activity slowly      Discharge instructions      Comments:   Followup with [name], MD (PCP) in 1 week, please consider having a referral to gastroenterology for possible GI evaluation for noncardiac chest pain.     Medication List  As of 12/05/2011  4:27 PM   TAKE these medications         aspirin EC 81 MG tablet   Take 81 mg by mouth every morning.      diltiazem 240 MG 24 hr capsule   Commonly known as: CARDIZEM CD   Take 240 mg by mouth every morning.      flecainide 150 MG tablet   Commonly known as: TAMBOCOR   Take 150 mg by mouth 2 (two) times daily.      LANTUS SOLOSTAR 100 UNIT/ML injection   Generic drug: insulin glargine   Inject 37 Units into the skin at bedtime.  loratadine 10 MG tablet   Commonly known as: CLARITIN   Take 10 mg by mouth every morning.      metFORMIN 1000 MG tablet   Commonly known as: GLUCOPHAGE   Take 1,000 mg by mouth 2 (two) times daily with a meal.      metoprolol succinate 50 MG 24 hr tablet   Commonly known as: TOPROL-XL   Take 200 mg by mouth at bedtime.      nitroGLYCERIN 0.4 MG SL tablet   Commonly known as: NITROSTAT   Place 0.4 mg under the tongue every 5 (five) minutes x 3 doses as needed. For chest pain.      pravastatin 20 MG tablet   Commonly known as: PRAVACHOL   Take 20 mg by mouth every  morning.      warfarin 5 MG tablet   Commonly known as: COUMADIN   Take 5-7.5 mg by mouth at bedtime. 1.5 tablets on Sunday, Tuesday, and Thursday.  1 tablet on all other days           Follow-up Information    Follow up with BOUSKA,DAVID E, MD. Schedule an appointment as soon as possible for a visit in 1 week. (Please discuss with your primary care physician about consideration for GI referral for noncardiac chest pain evaluation)    Contact information:   [address] [place] [address] [phone]           The results of significant diagnostics from this hospitalization (including imaging, microbiology, ancillary and laboratory) are listed below for reference.    Significant Diagnostic Studies: Dg Chest 2 View  12/04/2011  *RADIOLOGY REPORT*  Clinical Data: Chest pain.  Shortness of breath.  Cough.  Weakness.  CHEST - 2 VIEW  Comparison: 10/09/2011.  Findings: Normal heart size and pulmonary vascularity.  Suggestion of emphysematous changes and scattered fibrosis in the lungs. Nodular opacity in the left mid lung as seen previously appears to have an annular appearance today and may be due to scarring. Nodular opacity projected over the posterior lumbar spine likely represents hypertrophic change and is stable.  No focal airspace consolidation.  No blunting of costophrenic angles.  No pneumothorax.  Mediastinal contours appear intact.  Degenerative changes throughout the thoracic spine.  IMPRESSION: Stable appearance of the chest since previous study.  No evidence of active pulmonary disease.   Original Report Authenticated By: Marlon Pel, M.D.    Nm Myocar Multi W/spect W/wall Motion / Ef  12/05/2011  *RADIOLOGY REPORT*  Clinical Data:  Angina.  MYOCARDIAL IMAGING WITH SPECT (REST AND PHARMACOLOGIC-STRESS) GATED LEFT VENTRICULAR WALL MOTION STUDY LEFT VENTRICULAR EJECTION FRACTION  Technique:  Standard myocardial SPECT imaging was performed after  resting intravenous injection of 10 mCi Tc-92m tetrofosmin. Subsequently, intravenous infusion of Lexiscan was performed under the supervision of the Cardiology staff.  At peak effect of the drug, 30 mCi Tc-76m tetrofosmin was injected intravenously and standard myocardial SPECT  imaging was performed.  Quantitative gated imaging was also performed to evaluate left ventricular wall motion, and estimate left ventricular ejection fraction.  Comparison:  None.  Findings: Significant splanchnic activity is noted on the stress images.  The SPECT stress and rest images demonstrate no fixed or reversible defects to suggest infarction or ischemia.  The gated SPECT images demonstrate normal wall motion with estimated ejection fraction of 50%.  IMPRESSION:  1.  No findings for infarction or ischemia. 2.  Ejection fraction calculated at 50%.   Original Report Authenticated  By: P. Loralie Champagne, M.D.     Microbiology: No results found for this or any previous visit (from the past 240 hour(s)).   Labs: Basic Metabolic Panel:  Lab 12/05/11 3664 12/04/11 0459 12/04/11 0450  NA 137 139 136  K 4.2 4.1 3.9  CL 103 102 101  CO2 28 -- 25  GLUCOSE 130* 133* 139*  BUN 15 13 14   CREATININE 1.09 1.10 1.08  CALCIUM 8.8 -- 8.9  MG -- -- --  PHOS -- -- --   Liver Function Tests:  Lab 12/04/11 0450  AST 16  ALT 20  ALKPHOS 91  BILITOT 0.2*  PROT 6.9  ALBUMIN 3.3*   No results found for this basename: LIPASE:5,AMYLASE:5 in the last 168 hours No results found for this basename: AMMONIA:5 in the last 168 hours CBC:  Lab 12/05/11 0655 12/04/11 0459 12/04/11 0450  WBC 7.4 -- 7.7  NEUTROABS -- -- 4.5  HGB 10.8* 12.2* 10.5*  HCT 36.6* 36.0* 35.7*  MCV 70.8* -- 70.6*  PLT 296 -- 298   Cardiac Enzymes:  Lab 12/04/11 1724 12/04/11 1100  CKTOTAL -- 145  CKMB -- 2.2  CKMBINDEX -- --  TROPONINI <0.30 <0.30   BNP: BNP (last 3 results) No results found for this basename: PROBNP:3 in the last 8760  hours CBG:  Lab 12/05/11 1153 12/05/11 0613 12/04/11 2128 12/04/11 1612 12/04/11 1123  GLUCAP 168* 121* 141* 123* 134*    Time coordinating discharge: 25 minutes  Signed:  Zeb Rawl A  [facility] 12/05/2011, 4:27 PM

## 2011-12-05 NOTE — Progress Notes (Signed)
Subjective: Denies any chest pain or shortness of breath.  No specific complaints.  Eager to wait the results of the stress test for possible discharge today.  Objective: Vital signs in last 24 hours: Filed Vitals:   12/05/11 1005 12/05/11 1007 12/05/11 1009 12/05/11 1205  BP: 119/73 124/72 117/72 128/76  Pulse:      Temp:      TempSrc:      Resp:      Height:      Weight:      SpO2:       Weight change:   Intake/Output Summary (Last 24 hours) at 12/05/11 1312 Last data filed at 12/05/11 0830  Gross per 24 hour  Intake    460 ml  Output    450 ml  Net     10 ml    Physical Exam: General: Awake, Oriented, No acute distress. HEENT: EOMI. Neck: Supple CV: S1 and S2 Lungs: Clear to ascultation bilaterally Abdomen: Soft, Nontender, Nondistended, +bowel sounds. Ext: Good pulses. Trace edema.  Lab Results: Basic Metabolic Panel:  Lab 12/05/11 1610 12/04/11 0459 12/04/11 0450  NA 137 139 136  K 4.2 4.1 3.9  CL 103 102 101  CO2 28 -- 25  GLUCOSE 130* 133* 139*  BUN 15 13 14   CREATININE 1.09 1.10 1.08  CALCIUM 8.8 -- 8.9  MG -- -- --  PHOS -- -- --   Liver Function Tests:  Lab 12/04/11 0450  AST 16  ALT 20  ALKPHOS 91  BILITOT 0.2*  PROT 6.9  ALBUMIN 3.3*   No results found for this basename: LIPASE:5,AMYLASE:5 in the last 168 hours No results found for this basename: AMMONIA:5 in the last 168 hours CBC:  Lab 12/05/11 0655 12/04/11 0459 12/04/11 0450  WBC 7.4 -- 7.7  NEUTROABS -- -- 4.5  HGB 10.8* 12.2* 10.5*  HCT 36.6* 36.0* 35.7*  MCV 70.8* -- 70.6*  PLT 296 -- 298   Cardiac Enzymes:  Lab 12/04/11 1724 12/04/11 1100  CKTOTAL -- 145  CKMB -- 2.2  CKMBINDEX -- --  TROPONINI <0.30 <0.30   BNP (last 3 results) No results found for this basename: PROBNP:3 in the last 8760 hours CBG:  Lab 12/05/11 1153 12/05/11 0613 12/04/11 2128 12/04/11 1612 12/04/11 1123  GLUCAP 168* 121* 141* 123* 134*   No results found for this basename: HGBA1C:5 in the  last 72 hours Other Labs: No components found with this basename: POCBNP:3 No results found for this basename: DDIMER:2 in the last 168 hours No results found for this basename: CHOL:2,HDL:2,LDLCALC:2,TRIG:2,CHOLHDL:2,LDLDIRECT:2 in the last 168 hours No results found for this basename: TSH,T4TOTAL,FREET3,T3FREE,FREET4,THYROIDAB in the last 168 hours No results found for this basename: VITAMINB12:2,FOLATE:2,FERRITIN:2,TIBC:2,IRON:2,RETICCTPCT:2 in the last 168 hours  Micro Results: No results found for this or any previous visit (from the past 240 hour(s)).  Studies/Results: Dg Chest 2 View  12/04/2011  *RADIOLOGY REPORT*  Clinical Data: Chest pain.  Shortness of breath.  Cough.  Weakness.  CHEST - 2 VIEW  Comparison: 10/09/2011.  Findings: Normal heart size and pulmonary vascularity.  Suggestion of emphysematous changes and scattered fibrosis in the lungs. Nodular opacity in the left mid lung as seen previously appears to have an annular appearance today and may be due to scarring. Nodular opacity projected over the posterior lumbar spine likely represents hypertrophic change and is stable.  No focal airspace consolidation.  No blunting of costophrenic angles.  No pneumothorax.  Mediastinal contours appear intact.  Degenerative changes throughout the thoracic  spine.  IMPRESSION: Stable appearance of the chest since previous study.  No evidence of active pulmonary disease.   Original Report Authenticated By: Marlon Pel, M.D.     Medications: I have reviewed the patient's current medications. Scheduled Meds:    . aspirin  324 mg Oral Once  . aspirin EC  81 mg Oral Daily  . diltiazem  240 mg Oral Daily  . flecainide  150 mg Oral BID  . insulin aspart  0-15 Units Subcutaneous TID WC  . insulin glargine  15 Units Subcutaneous QHS  . loratadine  10 mg Oral Daily  . metoprolol succinate  200 mg Oral QHS  . simvastatin  10 mg Oral q1800  . sodium chloride  3 mL Intravenous Q12H  .  warfarin  10 mg Oral ONCE-1800  . Warfarin - Pharmacist Dosing Inpatient   Does not apply q1800  . DISCONTD: regadenoson  0.4 mg Intravenous Once  . DISCONTD: thyrotropin alfa  0.9 mg Intramuscular to XRAY  . DISCONTD: warfarin  7.5 mg Oral ONCE-1800   Continuous Infusions:    . sodium chloride 50 mL/hr at 12/05/11 0745   PRN Meds:.alum & mag hydroxide-simeth, HYDROcodone-acetaminophen, nitroGLYCERIN, ondansetron (ZOFRAN) IV, ondansetron, polyethylene glycol  Assessment/Plan: Chest pain Etiology unclear, per cath in Jan of 2012 has no occlusive coronary artery disease. Patient is chest pain free, continue aspirin 81 mg, continue statin, and metoprolol.  Cycle troponins to rule out ACS.  Continue sublingual nitroglycerin when necessary and morphine when necessary for chest pain.  Appreciate cardiology input, had myocardial perfusion scan on 12/05/2011 results pending.  If myocardial perfusion scan negative can possibly discharge today with outpatient GI evaluation for possible etiology for chest pain.  Hypertension Stable, continue home medications.   Axial fibrillation Rate controllted. Coumadin per pharmacy. Continue with the flecainide, beta blockers and Cardizem.  Chronic diastolic CHF Appears to be euvolemic, not in exacerbation.  Diabetes mellitus Continue with Lantus at a lower. Sliding scale insulin, will hold metformin.  Likely can be resumed at discharge.  Prophylaxis On warfarin.  Dispo Likely discharge today of myocardial perfusion scan negative.   LOS: 1 day  Adlee Paar A, MD 12/05/2011, 1:12 PM

## 2011-12-05 NOTE — Progress Notes (Signed)
Agree with the NP/PA-C note as written.  Will review nuclear results. If negative, proceed with possible GI work-up.  Chrystie Nose, MD, [facility] Attending Cardiologist [facility]

## 2011-12-05 NOTE — Progress Notes (Signed)
ANTICOAGULATION CONSULT NOTE - Follow Up Consult  Pharmacy Consult for Coumadin Indication: atrial fibrillation  No Known Allergies  Patient Measurements: Height: 5\' 6"  (167.6 cm) Weight: 212 lb 11.9 oz (96.5 kg) (c scale) IBW/kg (Calculated) : 63.8  Heparin Dosing Weight:   Vital Signs: Temp: 98.6 F (37 C) (09/10 0530) Temp src: Oral (09/10 0530) BP: 117/72 mmHg (09/10 1009) Pulse Rate: 84  (09/10 0530)  Labs:  Basename 12/05/11 0655 12/04/11 1724 12/04/11 1100 12/04/11 0534 12/04/11 0459 12/04/11 0450  HGB 10.8* -- -- -- 12.2* --  HCT 36.6* -- -- -- 36.0* 35.7*  PLT 296 -- -- -- -- 298  APTT -- -- -- -- -- --  LABPROT 21.7* -- -- 22.6* -- --  INR 1.85* -- -- 1.95* -- --  HEPARINUNFRC -- -- -- -- -- --  CREATININE 1.09 -- -- -- 1.10 1.08  CKTOTAL -- -- 145 -- -- --  CKMB -- -- 2.2 -- -- --  TROPONINI -- <0.30 <0.30 -- -- --    Estimated Creatinine Clearance: 66.6 ml/min (by C-G formula based on Cr of 1.09).  Assessment: 71yom on Coumadin for h/o Afib. INR (1.85) is subtherapeutic and trended down due to 9/9 dose not given (RN contacted - stated she did not give because medication was not available). Home Coumadin regimen is 5mg  daily except 7.5mg  on Sunday, Tuesday and Thursday. - H/H and Plts stable - No significant bleeding reported  Goal of Therapy:  INR 2-3   Plan:  1. Coumadin 10mg  po x 1 today 2. Follow-up AM INR  Cleon Dew [phone] 12/05/2011,10:53 AM

## 2011-12-18 IMAGING — CR DG CHEST 2V
2 series · 2 of 2 positions shown · non-contrast
Comparison: 08/29/2010.

CLINICAL DATA: Chest congestion and shortness of breath.  Weakness.
Smoker.

CHEST - 2 VIEW

[w chest pa]
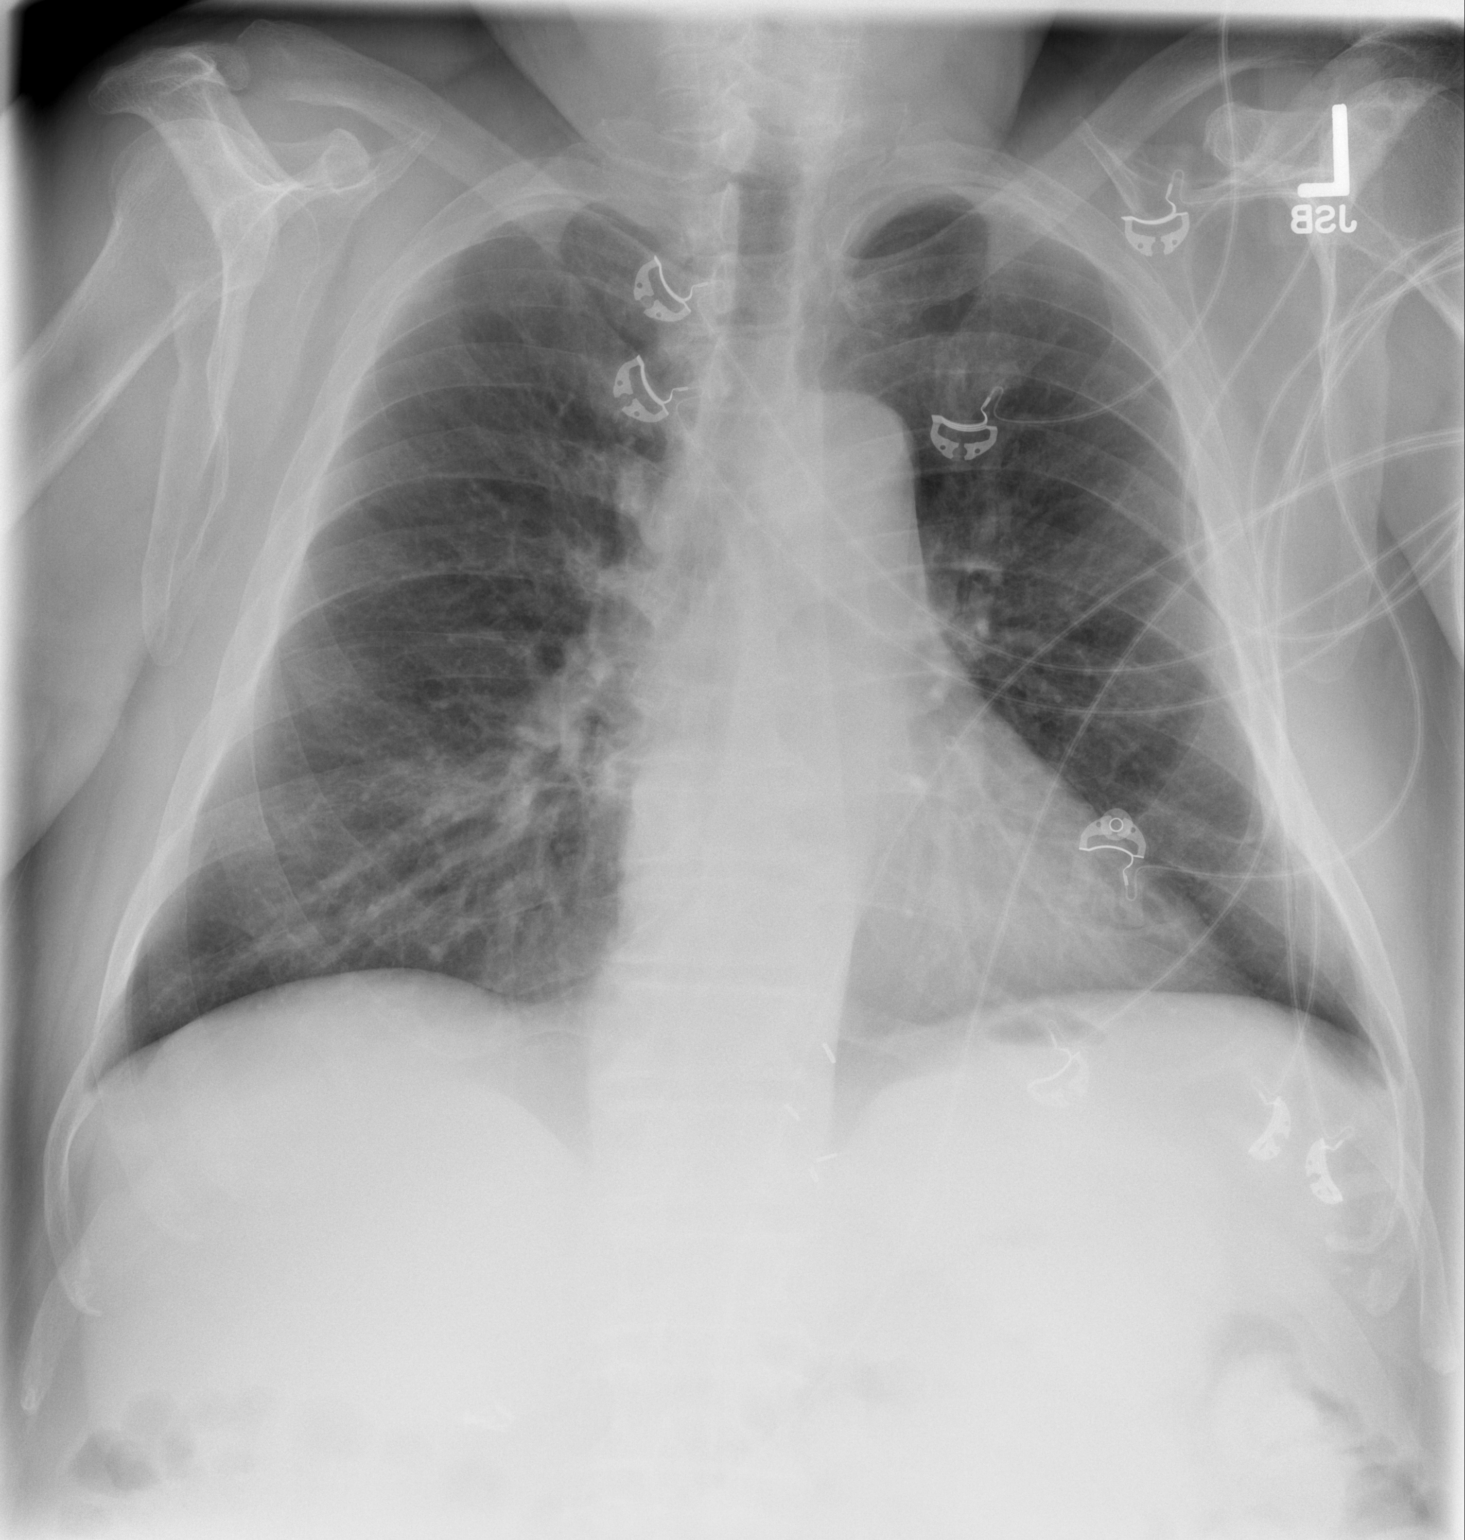

[w chest lat]
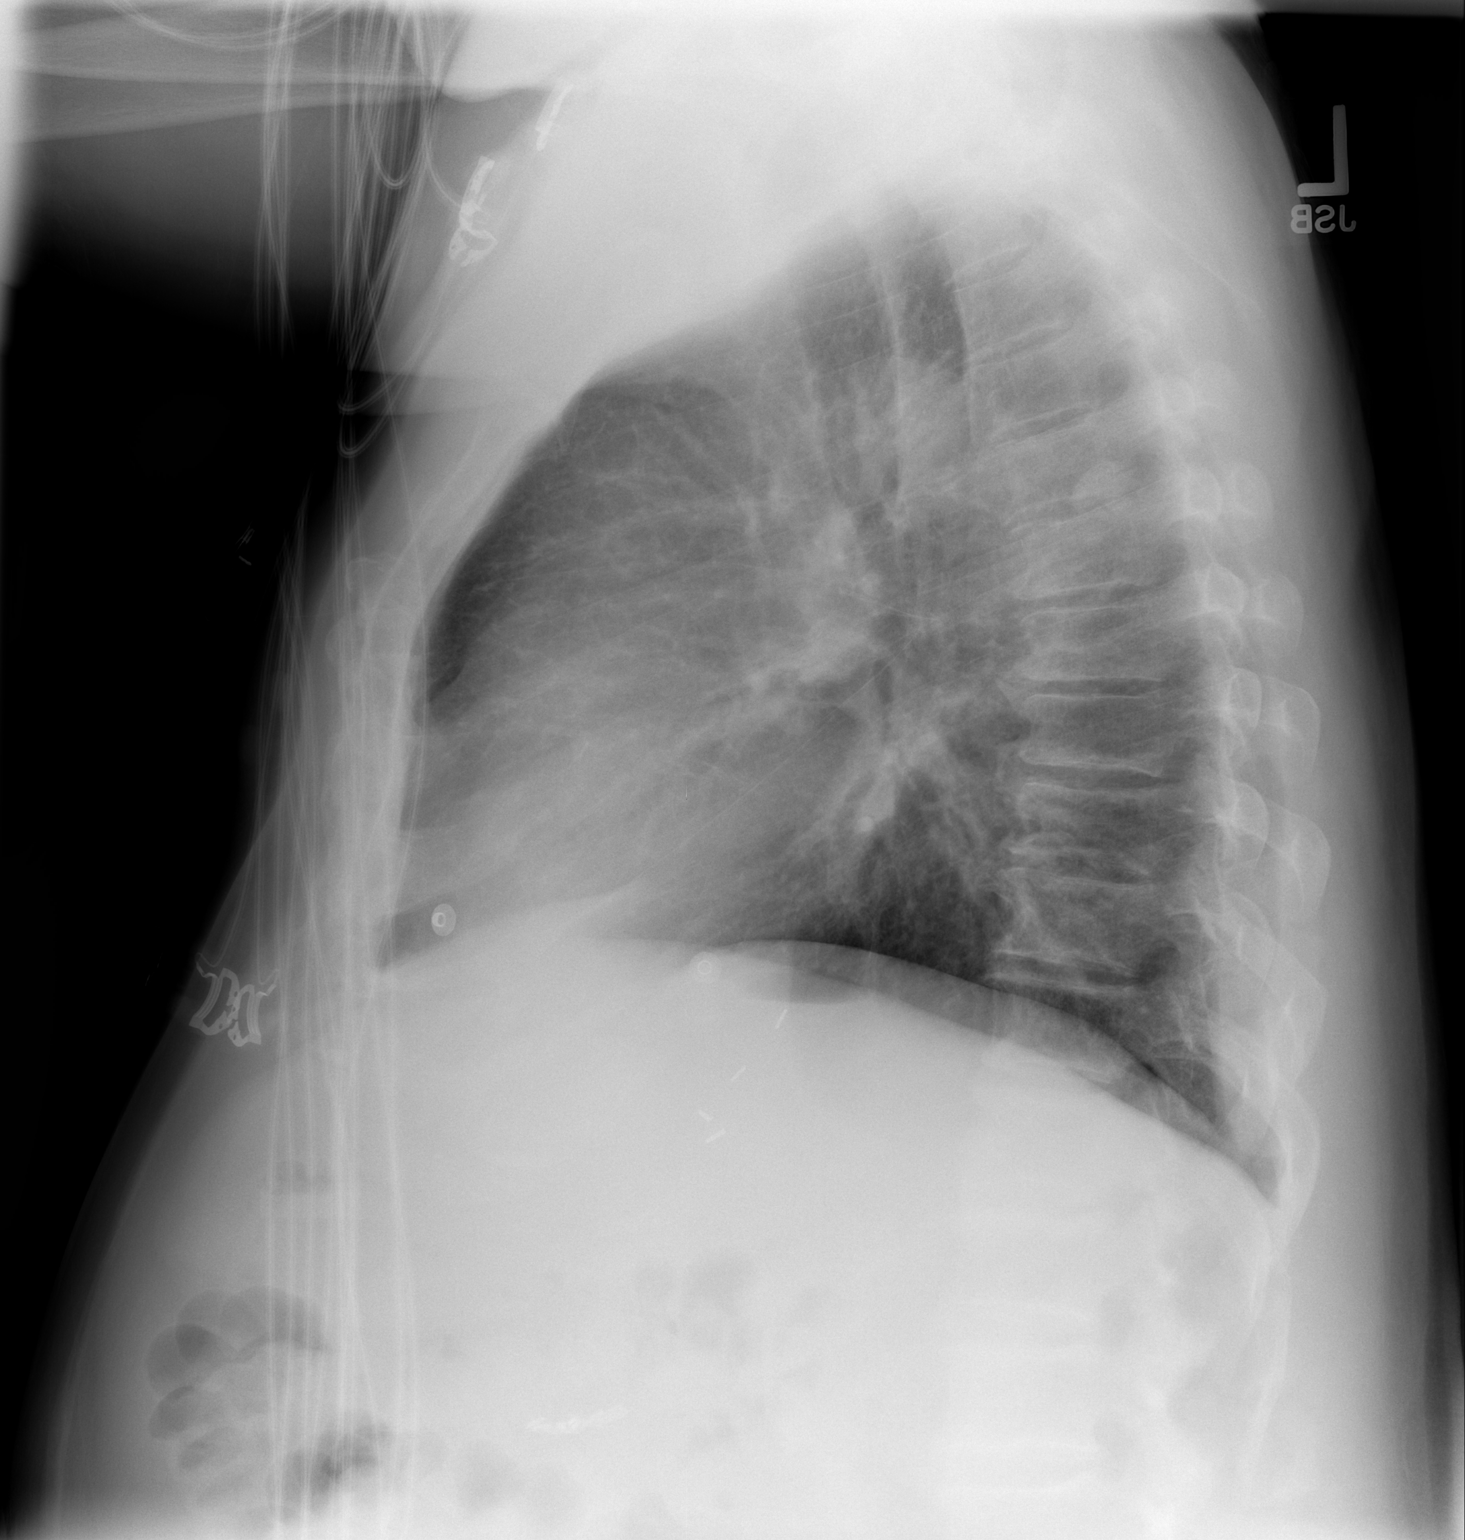

[2 of 2 positions shown; findings below may reference images not displayed]

FINDINGS: The heart remains normal in size.  The lungs are clear.
Stable mild diffuse peribronchial thickening. Thoracic spine
degenerative changes.  Cholecystectomy clips and surgical clips in
the region of the gastroesophageal junction.
IMPRESSION: Stable mild chronic bronchitic changes.  No acute abnormality.

## 2011-12-19 ENCOUNTER — Encounter (HOSPITAL_COMMUNITY): Payer: Self-pay

## 2011-12-19 ENCOUNTER — Emergency Department (HOSPITAL_COMMUNITY)
Admission: EM | Admit: 2011-12-19 | Discharge: 2011-12-19 | Disposition: A | Discharge status: Home / Self Care | Payer: Medicare Other | Attending: Emergency Medicine | Admitting: Emergency Medicine

## 2011-12-19 ENCOUNTER — Emergency Department (HOSPITAL_COMMUNITY): Payer: Medicare Other

## 2011-12-19 DIAGNOSIS — E119 Type 2 diabetes mellitus without complications: Secondary | ICD-10-CM | POA: Insufficient documentation

## 2011-12-19 DIAGNOSIS — I10 Essential (primary) hypertension: Secondary | ICD-10-CM | POA: Insufficient documentation

## 2011-12-19 DIAGNOSIS — R110 Nausea: Secondary | ICD-10-CM | POA: Insufficient documentation

## 2011-12-19 DIAGNOSIS — I509 Heart failure, unspecified: Secondary | ICD-10-CM | POA: Insufficient documentation

## 2011-12-19 DIAGNOSIS — Z7901 Long term (current) use of anticoagulants: Secondary | ICD-10-CM | POA: Insufficient documentation

## 2011-12-19 DIAGNOSIS — F17200 Nicotine dependence, unspecified, uncomplicated: Secondary | ICD-10-CM | POA: Insufficient documentation

## 2011-12-19 DIAGNOSIS — Z794 Long term (current) use of insulin: Secondary | ICD-10-CM | POA: Insufficient documentation

## 2011-12-19 DIAGNOSIS — R259 Unspecified abnormal involuntary movements: Secondary | ICD-10-CM | POA: Insufficient documentation

## 2011-12-19 DIAGNOSIS — I252 Old myocardial infarction: Secondary | ICD-10-CM | POA: Insufficient documentation

## 2011-12-19 DIAGNOSIS — R079 Chest pain, unspecified: Secondary | ICD-10-CM | POA: Insufficient documentation

## 2011-12-19 DIAGNOSIS — I5032 Chronic diastolic (congestive) heart failure: Secondary | ICD-10-CM | POA: Insufficient documentation

## 2011-12-19 LAB — COMPREHENSIVE METABOLIC PANEL
AST: 19 U/L (ref 0–37)
BUN: 14 mg/dL (ref 6–23)
CO2: 23 mEq/L (ref 19–32)
Calcium: 9.5 mg/dL (ref 8.4–10.5)
Chloride: 101 mEq/L (ref 96–112)
Creatinine, Ser: 1.04 mg/dL (ref 0.50–1.35)
GFR calc Af Amer: 81 mL/min — ABNORMAL LOW (ref >90)
GFR calc non Af Amer: 70 mL/min — ABNORMAL LOW (ref >90)
Glucose, Bld: 103 mg/dL — ABNORMAL HIGH (ref 70–99)
Total Bilirubin: 0.2 mg/dL — ABNORMAL LOW (ref 0.3–1.2)

## 2011-12-19 LAB — CBC WITH DIFFERENTIAL/PLATELET
Basophils Relative: 1 % (ref 0–1)
Eosinophils Absolute: 0.2 10*3/uL (ref 0.0–0.7)
Eosinophils Relative: 2 % (ref 0–5)
HCT: 38.1 % — ABNORMAL LOW (ref 39.0–52.0)
Hemoglobin: 11.4 g/dL — ABNORMAL LOW (ref 13.0–17.0)
Lymphs Abs: 2.4 10*3/uL (ref 0.7–4.0)
MCH: 20.8 pg — ABNORMAL LOW (ref 26.0–34.0)
MCHC: 29.9 g/dL — ABNORMAL LOW (ref 30.0–36.0)
MCV: 69.5 fL — ABNORMAL LOW (ref 78.0–100.0)
Monocytes Absolute: 0.7 10*3/uL (ref 0.1–1.0)
Neutro Abs: 5.4 10*3/uL (ref 1.7–7.7)
Neutrophils Relative %: 62 % (ref 43–77)
RBC: 5.48 MIL/uL (ref 4.22–5.81)

## 2011-12-19 MED ORDER — IOHEXOL 300 MG/ML  SOLN
100.0000 mL | Freq: Once | INTRAMUSCULAR | Status: AC | PRN
  Administered 2011-12-19: 100 mL via INTRAVENOUS

## 2011-12-19 NOTE — ED Notes (Signed)
I-Stat Troponin POCT resulted:   0.00 ng/mL 

## 2011-12-19 NOTE — ED Notes (Signed)
EMS reports pain began last night, left chest to left flank "Makes his chest shake" took own ASA PTA

## 2011-12-19 NOTE — ED Provider Notes (Signed)
Medical screening examination/treatment/procedure(s) were conducted as a shared visit with non-physician practitioner(s) and myself.  I personally evaluated the patient during the encounter On my exam this patient was in no distress.  On repeat checks the patient was resting comfortably, in no distress.  The patient's case as well described by the physician assistant.  I reviewed his prior chart, including his echocardiogram from 2 weeks ago.  I saw the ECG, relevant labs and studies - I agree with the interpretation.   Gerhard Munch, MD 12/19/11 [phone]

## 2011-12-19 NOTE — ED Provider Notes (Signed)
History     CSN: [id number]  Arrival date & time 12/19/11  1720   First MD Initiated Contact with Patient 12/19/11 1728      Chief Complaint  Patient presents with  . Chest Pain    (Consider location/radiation/quality/duration/timing/severity/associated sxs/prior treatment) The history is provided by the patient and medical records.    [name] is a 71 y.o. male presents to the emergency department complaining of L side chest pain.  The onset of the symptoms was  abrupt starting last night.  The patient has associated "shaking in his chest and arms" and nausea.  The symptoms have been  intermittent, gradually worsened.  nothing makes the symptoms worse and nothing makes symptoms better.  The patient denies fever, chills, neck pain, back pain, shortness of breath,  Vomiting, diarrhea, weakness, dizziness, syncopal episode.  Patient states these episodes are similar to his other episodes. He states the pain began yesterday evening. It is described as sharp in nature, intermittent and, rated 10 out of 10 when it times, and it does not radiate. He was seen by his endocrinologist yesterday and the appointment went well with no complications or concerns raised.  Patient has history of A. Fib.    Patient seen here on 12/04/2011 for similar symptoms and subsequently and admitted overnight.  History of Cardiac cath with nonobstructive coronary artery disease.  PMHx: hypertension, diabetes, COPD.  Cardiologist is Dr. Herbie Baltimore at [facility] cardiovascular.    Past Medical History  Diagnosis Date  . Cholecystitis 04/22/2010    acute s/p cholecystectomy  . Gallstone pancreatitis 04/22/10  . Atrial flutter     s/p CTI ablation by [facility] 08/05/10  . Diabetes mellitus type II   . Hyperlipidemia   . HTN (hypertension)   . Diastolic CHF, chronic     preserved EF  . Atrial fibrillation     with rapid ventricular response status post direct current cardioversion on December 07, 2010, to normal  sinus rhythm  . Tobacco abuse   . Cervical radiculopathy   . Cervical radiculopathy   . Coronary artery disease     Est. EF of 55% -- History of nonobstructive coronary artery disease by cath in January 2012  . Heart palpitations   . History of MI (myocardial infarction)   . History of DVT (deep vein thrombosis)     Past Surgical History  Procedure Date  . Cholecystectomy 1/12  . Atrial ablation surgery 08/05/10    CTI ablation for atrial flutter by [facility]  . Cardioversion 12/07/2010     Successful direct current cardioversion with atrial fibrillation to normal sinus rhythm  . Cardiac catheterization   . Subtotal gastrectomy     Family History  Problem Relation Age of Onset  . Cancer Mother   . Heart attack Father   . Heart attack Brother     History  Substance Use Topics  . Smoking status: Current Every Day Smoker -- 0.5 packs/day for 50 years    Types: Cigarettes  . Smokeless tobacco: Never Used  . Alcohol Use: No      Review of Systems  Constitutional: Negative for fever, diaphoresis, appetite change, fatigue and unexpected weight change.  HENT: Negative for mouth sores and neck stiffness.   Eyes: Negative for visual disturbance.  Respiratory: Negative for cough, chest tightness, shortness of breath and wheezing.   Cardiovascular: Positive for chest pain.  Gastrointestinal: Positive for nausea. Negative for vomiting, abdominal pain, diarrhea and constipation.  Genitourinary: Negative for dysuria, urgency,  frequency and hematuria.  Skin: Negative for rash.  Neurological: Positive for tremors. Negative for syncope, light-headedness and headaches.  Psychiatric/Behavioral: Negative for disturbed wake/sleep cycle. The patient is not nervous/anxious.     Allergies  Review of patient's allergies indicates no known allergies.  Home Medications   Current Outpatient Rx  Name Route Sig Dispense Refill  . ASPIRIN EC 81 MG PO TBEC Oral Take 81 mg by mouth every morning.      [name] DILTIAZEM HCL ER COATED BEADS 240 MG PO CP24 Oral Take 240 mg by mouth every morning.    [name] FLECAINIDE ACETATE 150 MG PO TABS Oral Take 150 mg by mouth 2 (two) times daily.    . INSULIN GLARGINE 100 UNIT/ML [place] SOLN Subcutaneous Inject 10-27 Units into the skin at bedtime. Patient does 27u around 10pm and 10 units a few hours later    . LORATADINE 10 MG PO TABS Oral Take 10 mg by mouth every morning.     [name] METFORMIN HCL 1000 MG PO TABS Oral Take 1,000 mg by mouth 2 (two) times daily with a meal.     . METOPROLOL SUCCINATE ER 50 MG PO TB24 Oral Take 100 mg by mouth 2 (two) times daily.     [name] NITROGLYCERIN 0.4 MG SL SUBL Sublingual Place 0.4 mg under the tongue every 5 (five) minutes x 3 doses as needed. For chest pain.    [name] PRAVASTATIN SODIUM 20 MG PO TABS Oral Take 20 mg by mouth every morning.     . WARFARIN SODIUM 5 MG PO TABS Oral Take 5-7.5 mg by mouth at bedtime. 1.5 tablets on Sunday, Tuesday, and Thursday. 1 tablet on all other days      BP 106/54  Pulse 72  Temp 98.3 F (36.8 C) (Oral)  Resp 19  SpO2 97%  Physical Exam  Nursing note and vitals reviewed. Constitutional: He appears well-developed and well-nourished. No distress.  HENT:  Head: Normocephalic and atraumatic.  Mouth/Throat: Oropharynx is clear and moist. No oropharyngeal exudate.       Patient with congestion in his throat, no cough  Eyes: Conjunctivae normal are normal. No scleral icterus.  Neck: Normal range of motion. Neck supple.  Cardiovascular: Normal rate, regular rhythm, normal heart sounds and intact distal pulses.  Exam reveals no gallop and no friction rub.   No murmur heard.      No evidence of A. fib today No pedal edema  Pulmonary/Chest: Effort normal and breath sounds normal. No respiratory distress. He has no wheezes. He has no rales. He exhibits tenderness.  Abdominal: Soft. Bowel sounds are normal. He exhibits no mass. There is no tenderness. There is no rebound and no guarding.   Musculoskeletal: Normal range of motion. He exhibits no edema and no tenderness.  Lymphadenopathy:    He has no cervical adenopathy.  Neurological: He is alert. He exhibits normal muscle tone. Coordination normal.       Speech is clear and goal oriented Moves extremities without ataxia  Skin: Skin is warm and dry. No rash noted. He is not diaphoretic. No erythema.  Psychiatric: He has a normal mood and affect.    ED Course  Procedures (including critical care time)  Labs Reviewed  CBC WITH DIFFERENTIAL - Abnormal; Notable for the following:    Hemoglobin 11.4 (*)     HCT 38.1 (*)     MCV 69.5 (*)     MCH 20.8 (*)     MCHC 29.9 (*)  RDW 17.3 (*)     All other components within normal limits  COMPREHENSIVE METABOLIC PANEL - Abnormal; Notable for the following:    Glucose, Bld 103 (*)     Total Bilirubin 0.2 (*)     GFR calc non Af Amer 70 (*)     GFR calc Af Amer 81 (*)     All other components within normal limits  PRO B NATRIURETIC PEPTIDE - Abnormal; Notable for the following:    Pro B Natriuretic peptide (BNP) 225.7 (*)     All other components within normal limits   Dg Chest 2 View  12/19/2011  *RADIOLOGY REPORT*  Clinical Data: Chest pain  CHEST - 2 VIEW  Comparison: 12/04/2011  Findings: Normal heart size.  Left lower lung zone linear scarring. Bronchitic changes.  Nodular density projects over the upper thoracic spine.  No obvious nodule on the PA view.  IMPRESSION: Possible nodule projecting over the thoracic spine.  CT is recommended.   Original Report Authenticated By: Donavan Burnet, M.D.    Ct Chest W Contrast  12/19/2011  *RADIOLOGY REPORT*  Clinical Data: Possible posterior nodule on plain film chest. Smoker.  CT CHEST WITH CONTRAST  Technique:  Multidetector CT imaging of the chest was performed following the standard protocol during bolus administration of intravenous contrast.  Contrast: OMNIPAQUE IOHEXOL 300 MG/ML  SOLN  Comparison: Plain film of  earlier today.  No prior CT.  Findings: Lung windows demonstrate mild nonspecific bronchial wall thickening to the lower lobes. 2 mm right upper lobe lung nodule on image 17.  2 mm right middle lobe lung nodule on image 30 3 mm superior segment right lower lobe nodule on image 18. No pulmonary parenchymal correlate for the plain film abnormality.  Soft tissue windows demonstrate mixing phenomenon in the left jugular vein.  Aortic atherosclerosis and tortuosity of the descending segment.  Normal heart size with mild left ventricular hypertrophy. Multivessel coronary artery atherosclerosis.  No pericardial or pleural effusion.  Mild lipomatous hypertrophy of the interatrial septum.  Small middle mediastinal nodes, without mediastinal or hilar adenopathy. Surgical changes at the gastroesophageal junction.  Limited abdominal imaging demonstrates probable mild hepatic steatosis.  Probable partial gastrectomy, incompletely imaged. Mildly prominent porta hepatis nodes which are likely secondary to steatosis/reactive.  No acute osseous abnormality.  The plain film abnormality was likely secondary to one of numerous upper thoracic vertebral osteophytes.  IMPRESSION:  1.  No dominant pulmonary nodule.  Plain film abnormality was secondary to a vertebral osteophyte. 2.  Tiny lung nodules. Given risk factors for bronchogenic carcinoma, follow-up chest CT at 1 year is recommended.  This recommendation follows the consensus statement:  "Guidelines for Management of Small Pulmonary Nodules Detected on CT Scans:  A Statement from the Fleischner Society" as published in Radiology 2005; 237:395-400.  Available online at: DietDisorder.cz. 3.  Mild hepatic steatosis. 4.  Mild left ventricular hypertrophy with multivessel coronary artery atherosclerosis.   Original Report Authenticated By: Consuello Bossier, M.D.    Results for orders placed during the hospital encounter of 12/19/11  CBC WITH  DIFFERENTIAL      Component Value Range   WBC 8.8  4.0 - 10.5 K/uL   RBC 5.48  4.22 - 5.81 MIL/uL   Hemoglobin 11.4 (*) 13.0 - 17.0 g/dL   HCT 21.3 (*) 08.6 - 57.8 %   MCV 69.5 (*) 78.0 - 100.0 fL   MCH 20.8 (*) 26.0 - 34.0 pg   MCHC 29.9 (*)  30.0 - 36.0 g/dL   RDW 78.2 (*) 95.6 - 21.3 %   Platelets 341  150 - 400 K/uL   Neutrophils Relative 62  43 - 77 %   Lymphocytes Relative 27  12 - 46 %   Monocytes Relative 8  3 - 12 %   Eosinophils Relative 2  0 - 5 %   Basophils Relative 1  0 - 1 %   Neutro Abs 5.4  1.7 - 7.7 K/uL   Lymphs Abs 2.4  0.7 - 4.0 K/uL   Monocytes Absolute 0.7  0.1 - 1.0 K/uL   Eosinophils Absolute 0.2  0.0 - 0.7 K/uL   Basophils Absolute 0.1  0.0 - 0.1 K/uL   RBC Morphology POLYCHROMASIA PRESENT     Smear Review LARGE PLATELETS PRESENT    COMPREHENSIVE METABOLIC PANEL      Component Value Range   Sodium 138  135 - 145 mEq/L   Potassium 4.3  3.5 - 5.1 mEq/L   Chloride 101  96 - 112 mEq/L   CO2 23  19 - 32 mEq/L   Glucose, Bld 103 (*) 70 - 99 mg/dL   BUN 14  6 - 23 mg/dL   Creatinine, Ser 0.86  0.50 - 1.35 mg/dL   Calcium 9.5  8.4 - 57.8 mg/dL   Total Protein 7.4  6.0 - 8.3 g/dL   Albumin 3.6  3.5 - 5.2 g/dL   AST 19  0 - 37 U/L   ALT 18  0 - 53 U/L   Alkaline Phosphatase 99  39 - 117 U/L   Total Bilirubin 0.2 (*) 0.3 - 1.2 mg/dL   GFR calc non Af Amer 70 (*) >90 mL/min   GFR calc Af Amer 81 (*) >90 mL/min  PRO B NATRIURETIC PEPTIDE      Component Value Range   Pro B Natriuretic peptide (BNP) 225.7 (*) 0 - 125 pg/mL   Dg Chest 2 View  12/04/2011  *RADIOLOGY REPORT*  Clinical Data: Chest pain.  Shortness of breath.  Cough.  Weakness.  CHEST - 2 VIEW  Comparison: 10/09/2011.  Findings: Normal heart size and pulmonary vascularity.  Suggestion of emphysematous changes and scattered fibrosis in the lungs. Nodular opacity in the left mid lung as seen previously appears to have an annular appearance today and may be due to scarring. Nodular opacity projected  over the posterior lumbar spine likely represents hypertrophic change and is stable.  No focal airspace consolidation.  No blunting of costophrenic angles.  No pneumothorax.  Mediastinal contours appear intact.  Degenerative changes throughout the thoracic spine.  IMPRESSION: Stable appearance of the chest since previous study.  No evidence of active pulmonary disease.   Original Report Authenticated By: Marlon Pel, M.D.    Nm Myocar Multi W/spect W/wall Motion / Ef  12/05/2011  *RADIOLOGY REPORT*  Clinical Data:  Angina.  MYOCARDIAL IMAGING WITH SPECT (REST AND PHARMACOLOGIC-STRESS) GATED LEFT VENTRICULAR WALL MOTION STUDY LEFT VENTRICULAR EJECTION FRACTION  Technique:  Standard myocardial SPECT imaging was performed after resting intravenous injection of 10 mCi Tc-49m tetrofosmin. Subsequently, intravenous infusion of Lexiscan was performed under the supervision of the Cardiology staff.  At peak effect of the drug, 30 mCi Tc-15m tetrofosmin was injected intravenously and standard myocardial SPECT  imaging was performed.  Quantitative gated imaging was also performed to evaluate left ventricular wall motion, and estimate left ventricular ejection fraction.  Comparison:  None.  Findings: Significant splanchnic activity is noted on the stress images.  The SPECT stress and rest images demonstrate no fixed or reversible defects to suggest infarction or ischemia.  The gated SPECT images demonstrate normal wall motion with estimated ejection fraction of 50%.  IMPRESSION:  1.  No findings for infarction or ischemia. 2.  Ejection fraction calculated at 50%.   Original Report Authenticated By: P. Loralie Champagne, M.D.     ECG:  Date: 12/19/2011  Rate: 75  Rhythm: normal sinus rhythm  QRS Axis: left  Intervals: PR prolonged  ST/T Wave abnormalities: normal  Conduction Disutrbances:first-degree A-V block   Narrative Interpretation: Sinus rhythm with 1st degree AV block, non-ischemic  Old EKG Reviewed:  unchanged    1. Chest pain       MDM  Izekiel Smyth sense with left-sided sharp chest pain.  Atypical chest pain, intermittent waves for almost 24 hours, sharp in nature, pt alert, in NAD, resting comfrtably.  CMP, CBC unremarkable. BNP of patient's baseline.  Troponin 0.00.  Stress echo on 12/05/2011 without major concern.   Chest x-ray today with possible pulmonary nodule, recommend CT correlation.  CT with contrast pending.  CT without dominant pulmonary nodule; plain film abnormality was secondary to her vertebral osteophyte.  Pt can be discharged home with cardiology follow-up as chest pain is not likely of cardiac or pulmonary etiology d/t presentation, VSS, no tracheal deviation, no JVD or new murmur, RRR, breath sounds equal bilaterally, EKG without acute abnormalities, and negative troponin. Pt has been advised to return to the ED if CP becomes exertional, associated with diaphoresis or nausea, radiates to left jaw/arm, worsens or becomes concerning in any way. Pt appears reliable for follow up and is agreeable to discharge.   Case has been discussed with and seen by Dr. Gerhard Munch who agrees with the above plan to discharge.    1. Medications: Usual home medications 2. Treatment: Rest, drink plenty of fluids 3. Follow Up: Cardiologist Dr Herbie Baltimore for further evaluation       Dierdre Forth, PA-C 12/19/11 2127

## 2011-12-20 LAB — POCT I-STAT TROPONIN I: Troponin i, poc: 0 ng/mL (ref 0.00–0.08)

## 2011-12-22 ENCOUNTER — Encounter: Payer: Self-pay | Admitting: Gastroenterology

## 2012-02-16 IMAGING — CR DG CHEST 1V PORT
1 series · 1 of 1 positions shown · non-contrast
Comparison: 10/07/2010

CLINICAL DATA: Shortness of breath, chest pain.

PORTABLE CHEST - 1 VIEW

[AP]
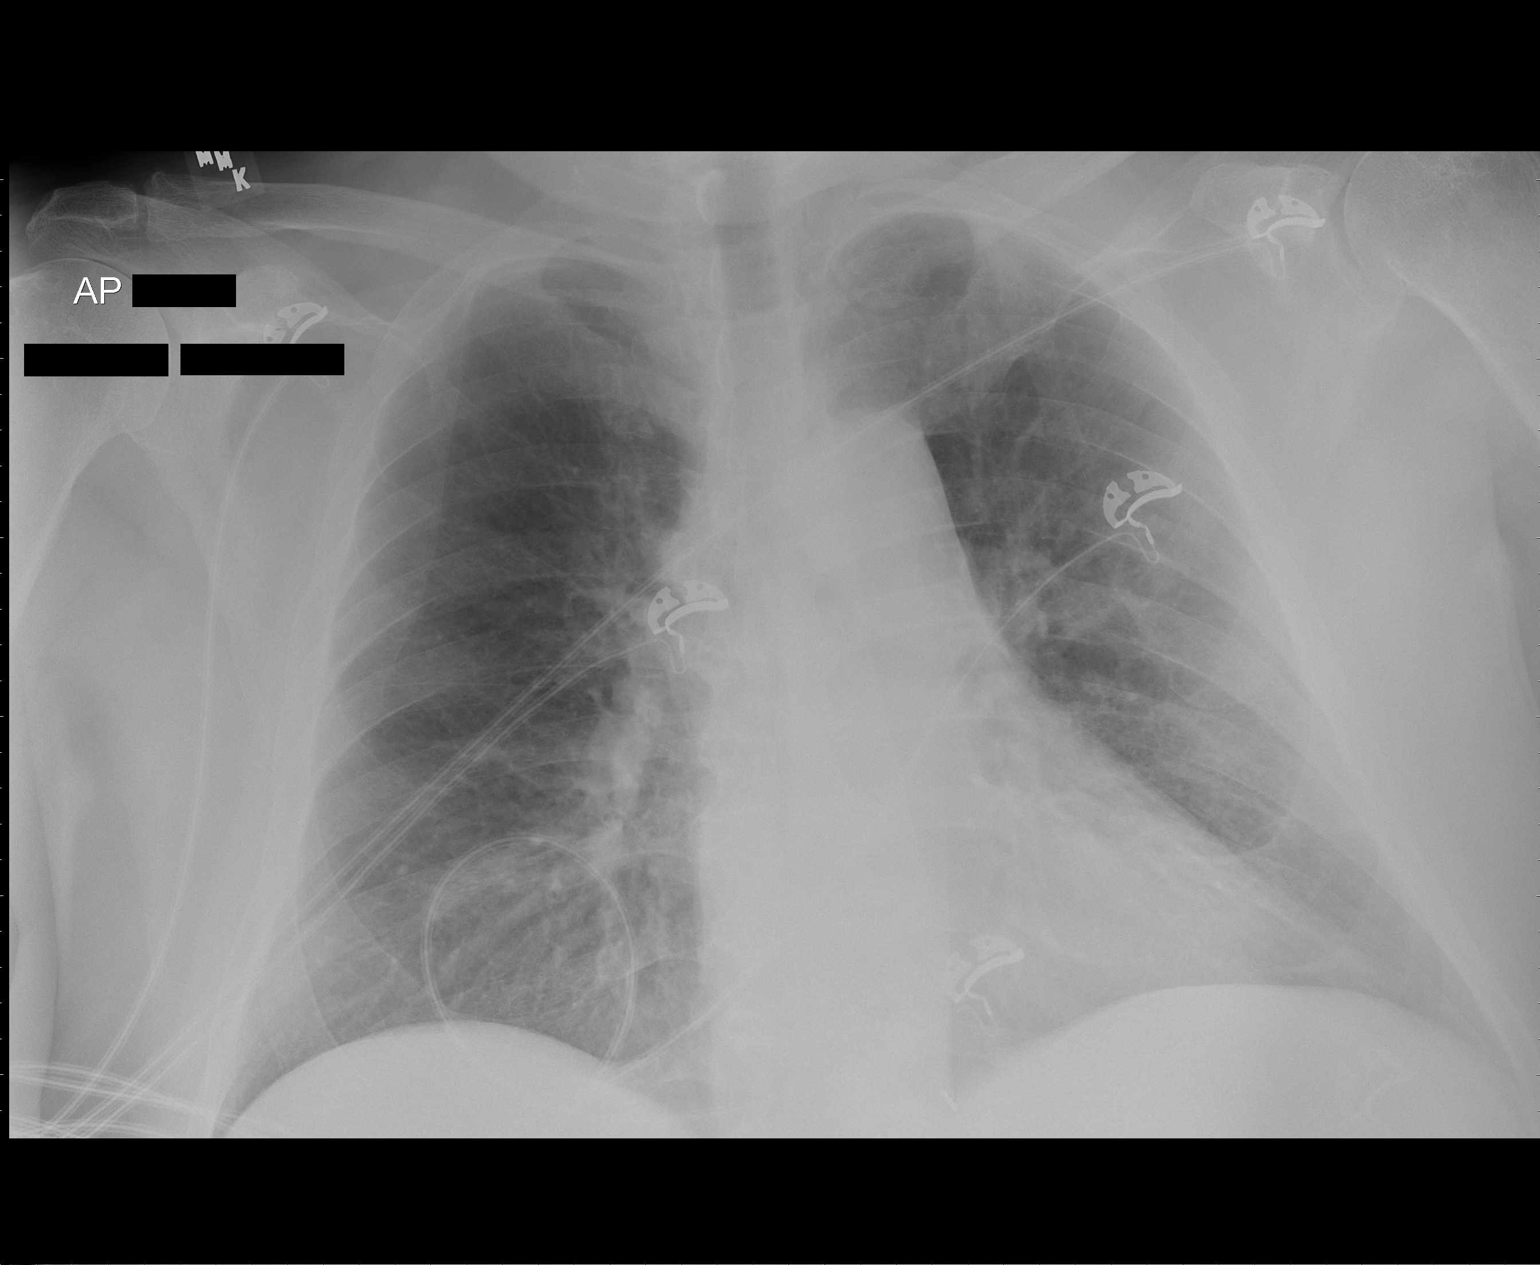

[1 of 1 positions shown; findings below may reference images not displayed]

FINDINGS: Cardiomegaly to upper normal limits with central vascular
and peribronchial prominence. This is similar to priors. No focal
consolidation.  No pleural effusion or pneumothorax.  No acute
osseous abnormality.
IMPRESSION: No focal consolidation.

## 2012-03-01 ENCOUNTER — Encounter: Payer: Medicare Other | Admitting: Gastroenterology

## 2012-05-29 ENCOUNTER — Emergency Department (HOSPITAL_COMMUNITY)
Admission: EM | Admit: 2012-05-29 | Discharge: 2012-05-30 | Disposition: A | Discharge status: Home / Self Care | Payer: Medicare Other | Attending: Emergency Medicine | Admitting: Emergency Medicine

## 2012-05-29 ENCOUNTER — Encounter (HOSPITAL_COMMUNITY): Payer: Self-pay | Admitting: *Deleted

## 2012-05-29 ENCOUNTER — Emergency Department (HOSPITAL_COMMUNITY): Payer: Medicare Other

## 2012-05-29 DIAGNOSIS — Z7901 Long term (current) use of anticoagulants: Secondary | ICD-10-CM | POA: Insufficient documentation

## 2012-05-29 DIAGNOSIS — I2510 Atherosclerotic heart disease of native coronary artery without angina pectoris: Secondary | ICD-10-CM | POA: Insufficient documentation

## 2012-05-29 DIAGNOSIS — I5032 Chronic diastolic (congestive) heart failure: Secondary | ICD-10-CM | POA: Insufficient documentation

## 2012-05-29 DIAGNOSIS — M25511 Pain in right shoulder: Secondary | ICD-10-CM

## 2012-05-29 DIAGNOSIS — Z7982 Long term (current) use of aspirin: Secondary | ICD-10-CM | POA: Insufficient documentation

## 2012-05-29 DIAGNOSIS — Z8739 Personal history of other diseases of the musculoskeletal system and connective tissue: Secondary | ICD-10-CM | POA: Insufficient documentation

## 2012-05-29 DIAGNOSIS — F17200 Nicotine dependence, unspecified, uncomplicated: Secondary | ICD-10-CM | POA: Insufficient documentation

## 2012-05-29 DIAGNOSIS — Z86718 Personal history of other venous thrombosis and embolism: Secondary | ICD-10-CM | POA: Insufficient documentation

## 2012-05-29 DIAGNOSIS — E119 Type 2 diabetes mellitus without complications: Secondary | ICD-10-CM | POA: Insufficient documentation

## 2012-05-29 DIAGNOSIS — E785 Hyperlipidemia, unspecified: Secondary | ICD-10-CM | POA: Insufficient documentation

## 2012-05-29 DIAGNOSIS — Z8719 Personal history of other diseases of the digestive system: Secondary | ICD-10-CM | POA: Insufficient documentation

## 2012-05-29 DIAGNOSIS — M25519 Pain in unspecified shoulder: Secondary | ICD-10-CM | POA: Insufficient documentation

## 2012-05-29 DIAGNOSIS — Z794 Long term (current) use of insulin: Secondary | ICD-10-CM | POA: Insufficient documentation

## 2012-05-29 DIAGNOSIS — Z8679 Personal history of other diseases of the circulatory system: Secondary | ICD-10-CM | POA: Insufficient documentation

## 2012-05-29 DIAGNOSIS — I252 Old myocardial infarction: Secondary | ICD-10-CM | POA: Insufficient documentation

## 2012-05-29 DIAGNOSIS — I10 Essential (primary) hypertension: Secondary | ICD-10-CM | POA: Insufficient documentation

## 2012-05-29 LAB — BASIC METABOLIC PANEL
CO2: 25 mEq/L (ref 19–32)
Calcium: 9.5 mg/dL (ref 8.4–10.5)
Creatinine, Ser: 0.99 mg/dL (ref 0.50–1.35)
GFR calc non Af Amer: 79 mL/min — ABNORMAL LOW (ref >90)
Glucose, Bld: 138 mg/dL — ABNORMAL HIGH (ref 70–99)

## 2012-05-29 LAB — URINE MICROSCOPIC-ADD ON

## 2012-05-29 LAB — URINALYSIS, ROUTINE W REFLEX MICROSCOPIC
Bilirubin Urine: NEGATIVE
Glucose, UA: NEGATIVE mg/dL
Hgb urine dipstick: NEGATIVE
Ketones, ur: NEGATIVE mg/dL
Protein, ur: NEGATIVE mg/dL

## 2012-05-29 LAB — CBC WITH DIFFERENTIAL/PLATELET
Basophils Relative: 1 % (ref 0–1)
Eosinophils Absolute: 0.3 10*3/uL (ref 0.0–0.7)
Eosinophils Relative: 3 % (ref 0–5)
Lymphs Abs: 3.4 10*3/uL (ref 0.7–4.0)
MCH: 20.1 pg — ABNORMAL LOW (ref 26.0–34.0)
MCHC: 30.6 g/dL (ref 30.0–36.0)
MCV: 65.8 fL — ABNORMAL LOW (ref 78.0–100.0)
Monocytes Absolute: 0.7 10*3/uL (ref 0.1–1.0)
Neutrophils Relative %: 56 % (ref 43–77)
Platelets: 340 10*3/uL (ref 150–400)
RBC: 5.46 MIL/uL (ref 4.22–5.81)

## 2012-05-29 LAB — POCT I-STAT TROPONIN I

## 2012-05-29 MED ORDER — HYDROCODONE-ACETAMINOPHEN 5-325 MG PO TABS
2.0000 | ORAL_TABLET | Freq: Once | ORAL | Status: AC
  Administered 2012-05-29: 2 via ORAL
  Filled 2012-05-29: qty 2

## 2012-05-29 MED ORDER — OXYCODONE-ACETAMINOPHEN 5-325 MG PO TABS: 1.0000 | ORAL_TABLET | ORAL | Status: DC | PRN

## 2012-05-29 NOTE — ED Notes (Signed)
Pt arrived by [facility] for pain across both shoulders, occuring for over one month and has seen pcp for it. No acute distress noted at triage.

## 2012-05-29 NOTE — ED Notes (Signed)
Pt placed in gown, belongings in bag

## 2012-05-29 NOTE — ED Notes (Signed)
Charge RN notified re: change in bed assignment in ED for cardiac monitoring, per [place], [place] pt c/o L sided CP

## 2012-05-29 NOTE — ED Provider Notes (Signed)
History    This chart was scribed for non-physician practitioner working with Joya Gaskins, MD by Sofie Rower, ED Scribe. This patient was seen in room TR05C/TR05C and the patient's care was started at 8:51PM.   CSN: [id number]  Arrival date & time 05/29/12  1753   First MD Initiated Contact with Patient 05/29/12 2051      Chief Complaint  Patient presents with  . Shoulder Pain    (Consider location/radiation/quality/duration/timing/severity/associated sxs/prior treatment) The history is provided by the patient. No language interpreter was used.    [name] is a 72 y.o. male , with a hx of diabetes, hypertension, heart palpitations, atrial fib (on coumadin) and MI, who presents to the Emergency Department complaining of intermittent, progressively worsening, shoulder pain located at the bilateral shoulders, radiating downwards towards the bilateral upper extremities, onset one month ago.  Associated symptoms include numbness located at the shoulders bilaterally. The pt reports he has been experiencing intermittent, bilateral, shoulder pain for the past month, however, he does not recall any trauma or injury which may have prompted his pain at the present point and time. Modifying factors include certain movements and positions which intensifies the bilateral shoulder pain. In addition pt reports CP, but states he was seen by [facility] cardiology on Friday and was "cleared". When asked in more detail pt denies any blood wk, imaging, or other testing being performed at that time.   The pt denies SOB, nausea, sweating, and difficulty breathing.   The pt is a current everyday smoker, however, he does not drink alcohol. The pt is taking blood thinners at the present point and time.   PCP is Dr. Everlene Other. Cardiologist is Dr. Herbie Baltimore ([facility])   Past Medical History  Diagnosis Date  . Cholecystitis 04/22/2010    acute s/p cholecystectomy  . Gallstone pancreatitis 04/22/10  .  Atrial flutter     s/p CTI ablation by [facility] 08/05/10  . Diabetes mellitus type II   . Hyperlipidemia   . HTN (hypertension)   . Diastolic CHF, chronic     preserved EF  . Atrial fibrillation     with rapid ventricular response status post direct current cardioversion on December 07, 2010, to normal sinus rhythm  . Tobacco abuse   . Cervical radiculopathy   . Cervical radiculopathy   . Coronary artery disease     Est. EF of 55% -- History of nonobstructive coronary artery disease by cath in January 2012  . Heart palpitations   . History of MI (myocardial infarction)   . History of DVT (deep vein thrombosis)     Past Surgical History  Procedure Laterality Date  . Cholecystectomy  1/12  . Atrial ablation surgery  08/05/10    CTI ablation for atrial flutter by [facility]  . Cardioversion  12/07/2010     Successful direct current cardioversion with atrial fibrillation to normal sinus rhythm  . Cardiac catheterization    . Subtotal gastrectomy      Family History  Problem Relation Age of Onset  . Cancer Mother   . Heart attack Father   . Heart attack Brother     History  Substance Use Topics  . Smoking status: Current Every Day Smoker -- 0.50 packs/day for 50 years    Types: Cigarettes  . Smokeless tobacco: Never Used  . Alcohol Use: No      Review of Systems  Musculoskeletal: Positive for arthralgias.  All other systems reviewed and are negative.  Allergies  Review of patient's allergies indicates no known allergies.  Home Medications   Current Outpatient Rx  Name  Route  Sig  Dispense  Refill  . aspirin EC 81 MG tablet   Oral   Take 81 mg by mouth every morning.         . diltiazem (CARDIZEM CD) 240 MG 24 hr capsule   Oral   Take 240 mg by mouth every morning.         . flecainide (TAMBOCOR) 150 MG tablet   Oral   Take 150 mg by mouth 2 (two) times daily.         . insulin glargine (LANTUS SOLOSTAR) 100 UNIT/ML injection   Subcutaneous   Inject 10-27  Units into the skin at bedtime. Patient does 27u around 10pm and 10 units a few hours later         . loratadine (CLARITIN) 10 MG tablet   Oral   Take 10 mg by mouth every morning.          . metFORMIN (GLUCOPHAGE) 1000 MG tablet   Oral   Take 1,000 mg by mouth 2 (two) times daily with a meal.          . metoprolol (TOPROL-XL) 50 MG 24 hr tablet   Oral   Take 100 mg by mouth 2 (two) times daily.          . nitroGLYCERIN (NITROSTAT) 0.4 MG SL tablet   Sublingual   Place 0.4 mg under the tongue every 5 (five) minutes x 3 doses as needed. For chest pain.         . pravastatin (PRAVACHOL) 20 MG tablet   Oral   Take 20 mg by mouth every morning.          . warfarin (COUMADIN) 5 MG tablet   Oral   Take 5-7.5 mg by mouth at bedtime. 1.5 tablets on Sunday, Tuesday, and Thursday. 1 tablet on all other days           BP 114/55  Pulse 78  Temp(Src) 98 F (36.7 C) (Oral)  Resp 16  SpO2 95%  Physical Exam  Nursing note and vitals reviewed. Constitutional: He appears well-developed and well-nourished. No distress.  HENT:  Head: Normocephalic and atraumatic.  Eyes: Conjunctivae and EOM are normal.  Neck: Normal range of motion. Neck supple.  Cardiovascular: Normal rate, regular rhythm and normal heart sounds.   No murmur heard. Intact distal pulses, capillary refill < 3 seconds  Pulmonary/Chest: Effort normal and breath sounds normal. No respiratory distress. He has no wheezes.  Right rib ttp.   Abdominal: Soft. Bowel sounds are normal.  Epigastric ttp, abd hard and distended (typical per pt)  Musculoskeletal:       Cervical back: He exhibits tenderness.  All other extremities with normal ROM. C-spine tenderness to palpitation. Large hump detected on exam (pt informs this hump has been present for many years).  Neurological:  No sensory deficit  Skin: Skin is warm and dry. He is not diaphoretic.  Skin intact, no tenting    ED Course  Procedures (including  critical care time)  DIAGNOSTIC STUDIES: Oxygen Saturation is 95% on room air, normal by my interpretation.    COORDINATION OF CARE:   9:03 PM- Treatment plan discussed with patient. Pt agrees with treatment.     Labs Reviewed - No data to display No results found.   No diagnosis found.    MDM  72 yo male  w hx of DM, Afib, DVT, MI and CHF presents to ER c/o CP, left shoulder pain (radiates dwn L arm and across back) onset 1 mo ago.  Low concern for cardiopulmonary etiology, however d/t co morbidities pt will be moved to back for more complete work up. Care to be resumed by another practitioner.       I personally performed the services described in this documentation, which was scribed in my presence. The recorded information has been reviewed and is accurate.    \  Jaci Carrel, [place] 05/29/12 2123

## 2012-05-29 NOTE — ED Provider Notes (Signed)
This pt was brought from [facility] after physical exam to await results of blood work. Managed pain. Lab work and chest xray did not indicate any acute emergency medical condition and the patient appears stable for discharge with appropriate outpatient follow up. Diagnosis was discussed with patient who verbalizes understanding and is agreeable to discharge. Pt case discussed with and seen by Dr. Bebe Shaggy who agrees with my plan.   Glade Nurse, PA-C 05/30/12 0009

## 2012-05-30 NOTE — ED Provider Notes (Signed)
Medical screening examination/treatment/procedure(s) were performed by non-physician practitioner and as supervising physician I was immediately available for consultation/collaboration.  Sunnie Nielsen, MD 05/30/12 [phone]

## 2012-05-31 LAB — URINE CULTURE

## 2012-05-31 NOTE — ED Provider Notes (Signed)
Medical screening examination/treatment/procedure(s) were conducted as a shared visit with non-physician practitioner(s) and myself.  I personally evaluated the patient during the encounter   Date: 05/30/2012  Rate: 57  Rhythm: normal sinus rhythm  QRS Axis: left  Intervals: normal  ST/T Wave abnormalities: nonspecific ST changes  Conduction Disutrbances:none   Pt well appearing, no distress, doubt ACS at this time.  Stable for d/c  Joya Gaskins, MD 05/31/12 [phone]

## 2012-06-12 ENCOUNTER — Ambulatory Visit: Payer: Self-pay | Admitting: Cardiology

## 2012-06-12 DIAGNOSIS — I4892 Unspecified atrial flutter: Secondary | ICD-10-CM

## 2012-06-19 IMAGING — CT CT ABD-PELV W/ CM
2 of 5 series · 14 of 32 positions shown, 19 images · IV contrast (omnipaque)
Comparison: CT abdomen and pelvis 04/14/2010.

CLINICAL DATA: Blood in stool.

CT ABDOMEN AND PELVIS WITH CONTRAST
TECHNIQUE: Multidetector CT imaging of the abdomen and pelvis was
performed following the standard protocol during bolus
administration of intravenous contrast.
Contrast: 100mL OMNIPAQUE IOHEXOL 300 MG/ML IV SOLN

[Series 2: routine abdomen · axial · 0.98mm/px · z∈[-450,-124]mm · 6 of 93 slices shown, 11 images]
[im 14/93  soft-tissue]
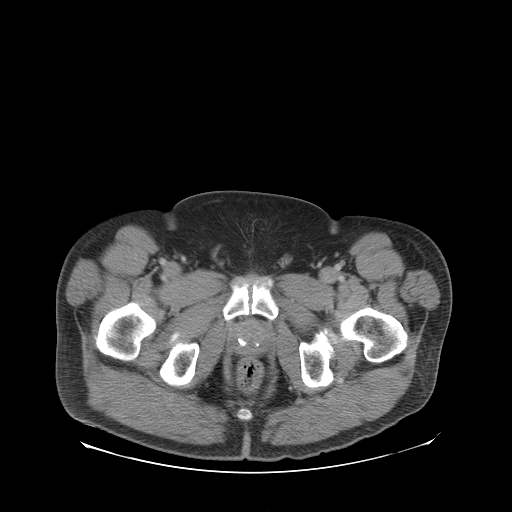
[im 14/93  bone]
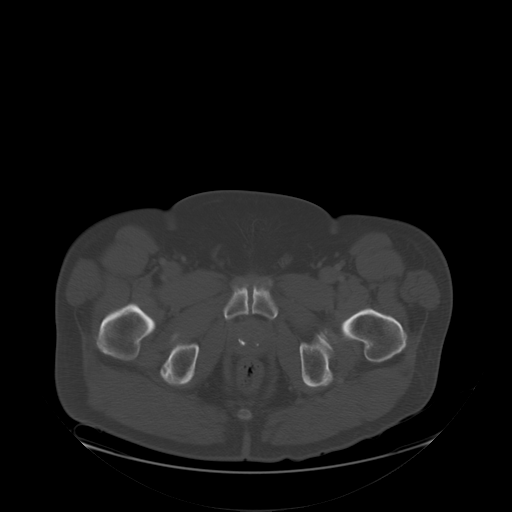
[im 27/93  soft-tissue]
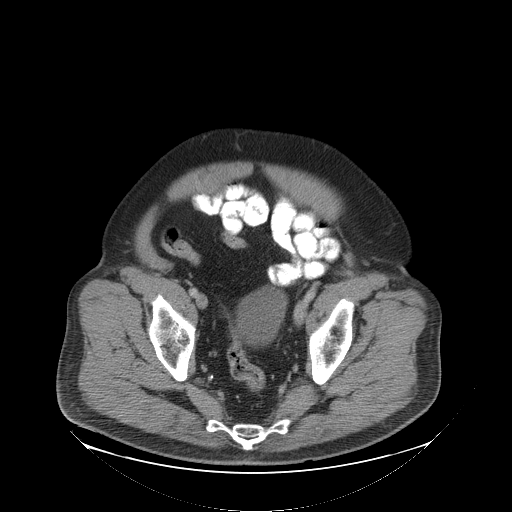
[im 40/93  soft-tissue]
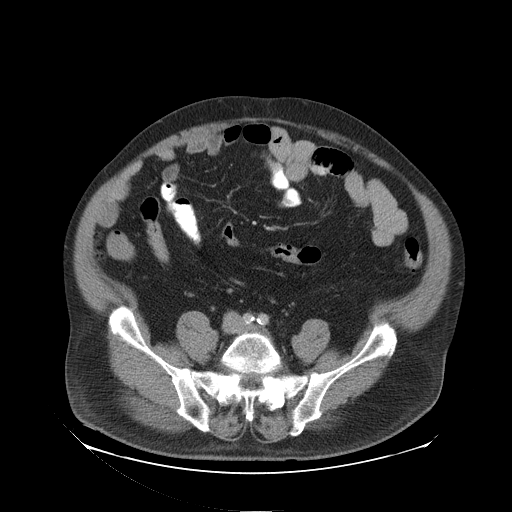
[im 40/93  lung]
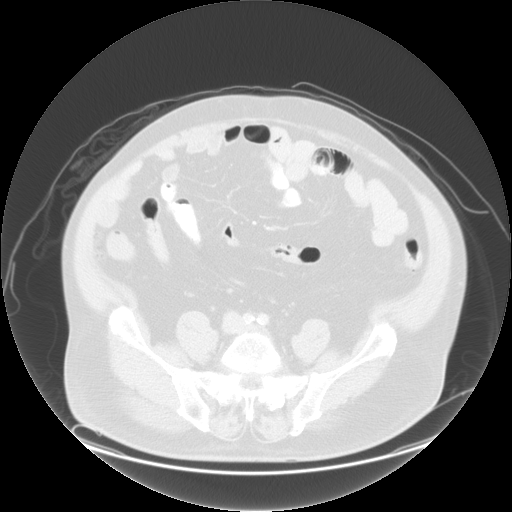
[im 53/93  soft-tissue]
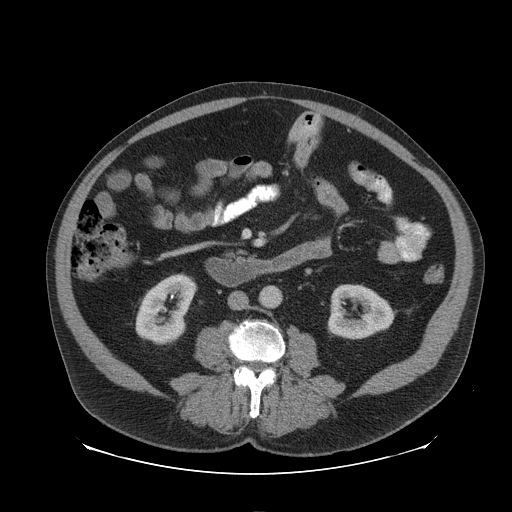
[im 53/93  lung]
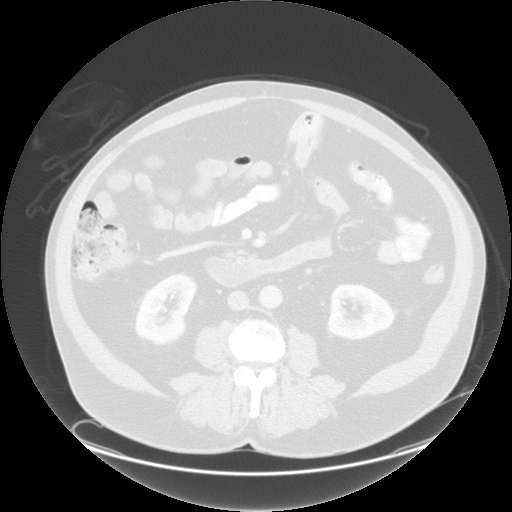
[im 66/93  soft-tissue]
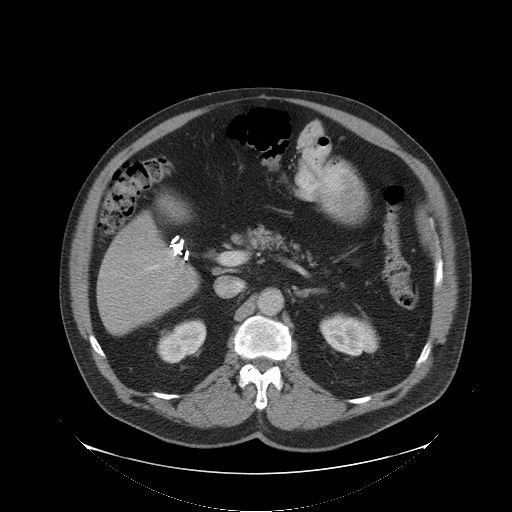
[im 66/93  lung]
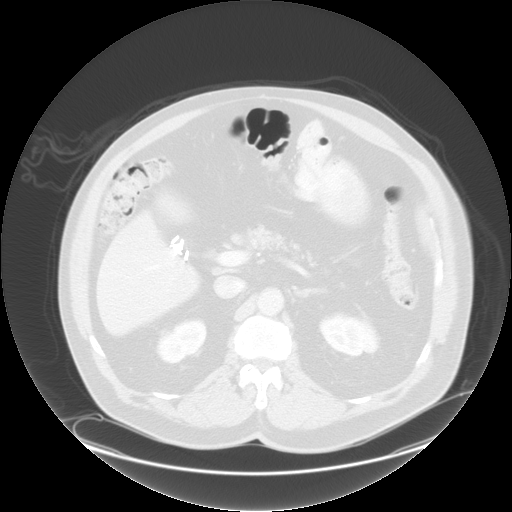
[im 79/93  soft-tissue]
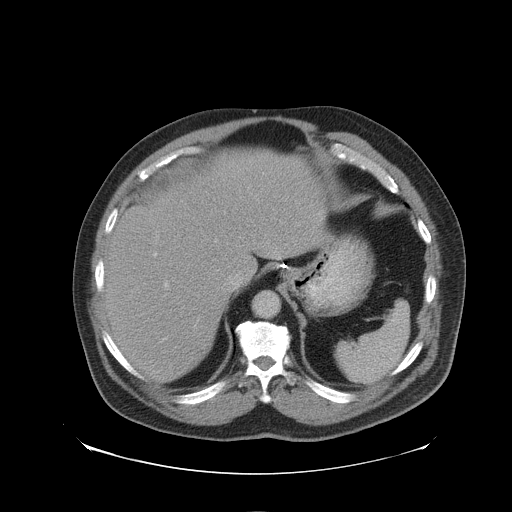
[im 79/93  lung]
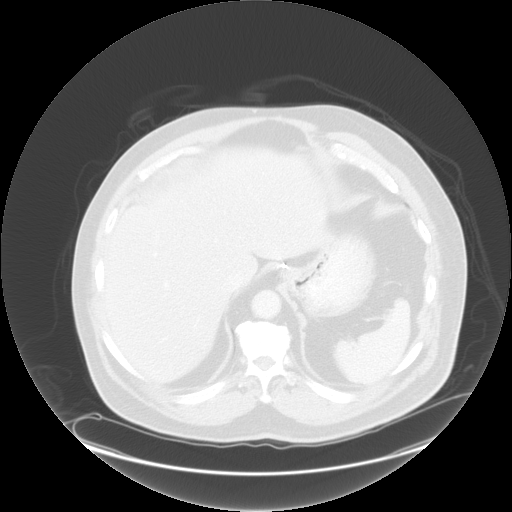

[Series 401: sagittal · sagittal · 0.98mm/px · 8 of 147 slices shown]
[im 15/147  soft-tissue]
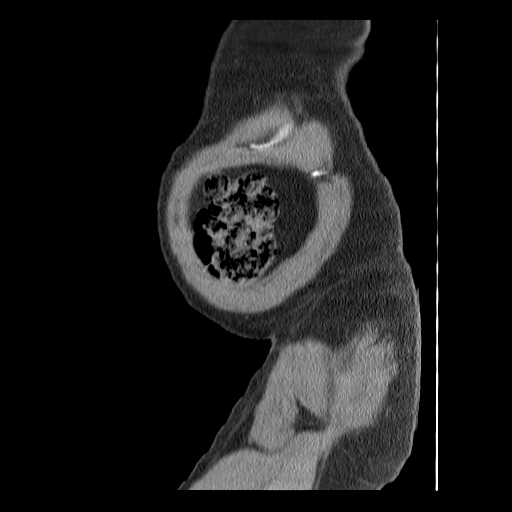
[im 30/147  soft-tissue]
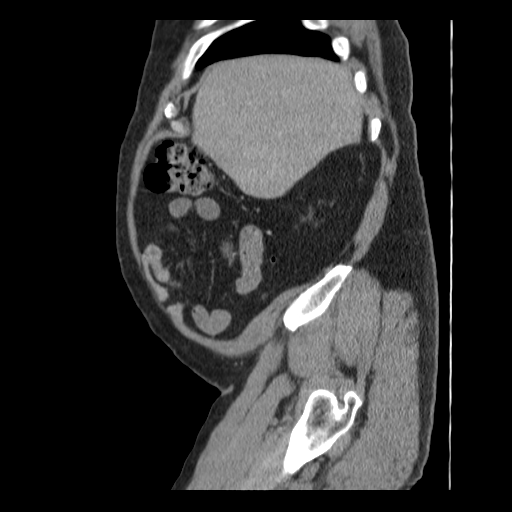
[im 44/147  soft-tissue]
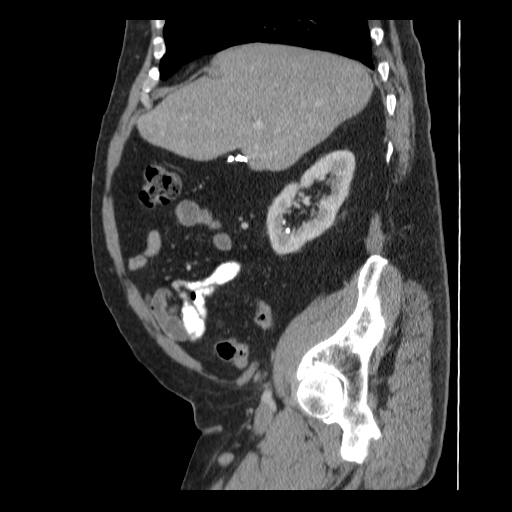
[im 59/147  soft-tissue]
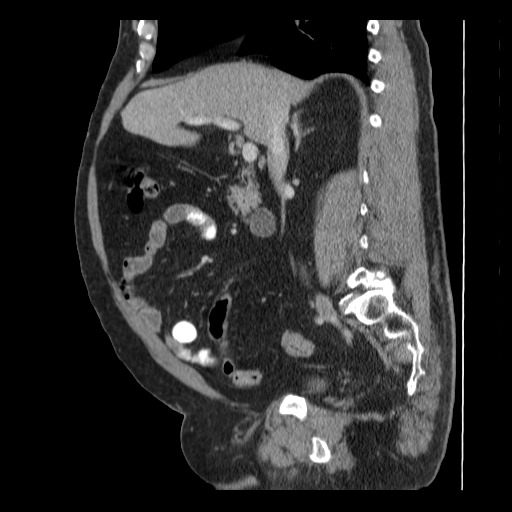
[im 88/147  soft-tissue]
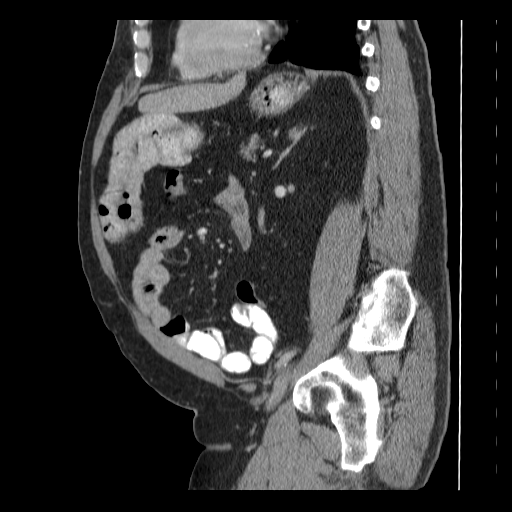
[im 103/147  soft-tissue]
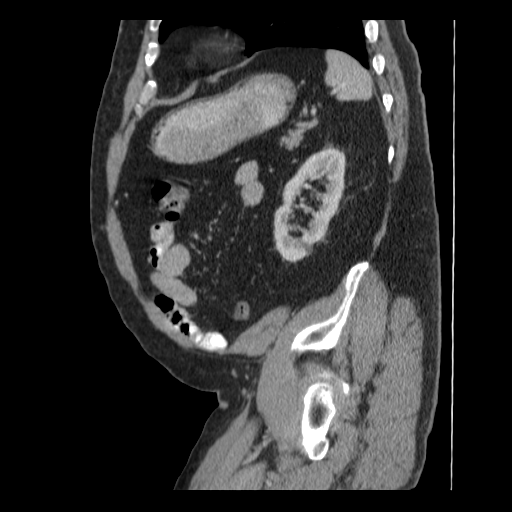
[im 117/147  soft-tissue]
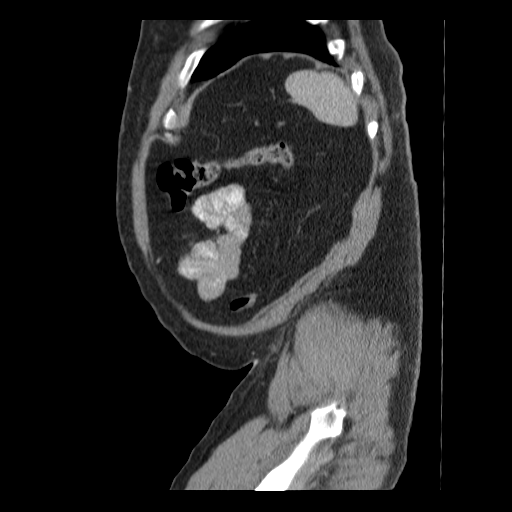
[im 132/147  soft-tissue]
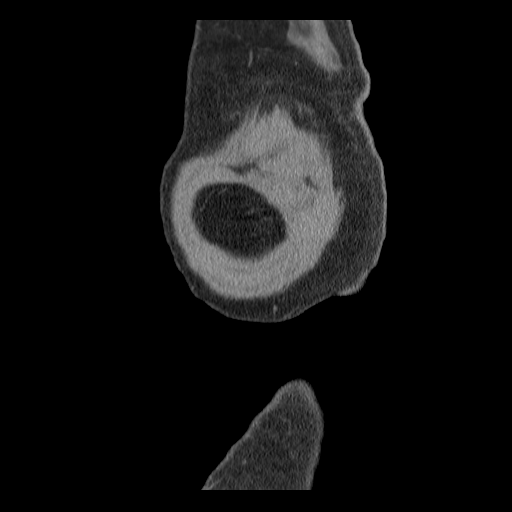

[14 of 32 positions shown; findings below may reference images not displayed]

FINDINGS: The lung bases are clear.  No pleural or pericardial
effusion.

The patient is status post cholecystectomy.  The liver, spleen,
adrenal glands, pancreas, biliary tree and right kidney are
unremarkable.  Very small cyst in the lower pole of the left kidney
is unchanged.  There is some scattered colonic diverticulosis but
no diverticulitis.  The colon is otherwise unremarkable.  The
appendix appears normal.  The duodenum does not cross the midline
which could be due to prior surgery or malrotation.  The appearance
is unchanged.  No lymphadenopathy or fluid.  No focal bony
abnormality.
IMPRESSION: 1.  No acute finding.
2.  Mild diverticulosis without diverticulitis.

## 2012-07-02 ENCOUNTER — Encounter (HOSPITAL_COMMUNITY): Payer: Self-pay

## 2012-07-02 ENCOUNTER — Emergency Department (HOSPITAL_COMMUNITY)
Admission: EM | Admit: 2012-07-02 | Discharge: 2012-07-03 | Disposition: A | Discharge status: Home / Self Care | Payer: Medicare Other | Attending: Emergency Medicine | Admitting: Emergency Medicine

## 2012-07-02 ENCOUNTER — Emergency Department (HOSPITAL_COMMUNITY): Payer: Medicare Other

## 2012-07-02 DIAGNOSIS — Z8719 Personal history of other diseases of the digestive system: Secondary | ICD-10-CM | POA: Insufficient documentation

## 2012-07-02 DIAGNOSIS — I5032 Chronic diastolic (congestive) heart failure: Secondary | ICD-10-CM | POA: Insufficient documentation

## 2012-07-02 DIAGNOSIS — F17200 Nicotine dependence, unspecified, uncomplicated: Secondary | ICD-10-CM | POA: Insufficient documentation

## 2012-07-02 DIAGNOSIS — R05 Cough: Secondary | ICD-10-CM | POA: Insufficient documentation

## 2012-07-02 DIAGNOSIS — Z794 Long term (current) use of insulin: Secondary | ICD-10-CM | POA: Insufficient documentation

## 2012-07-02 DIAGNOSIS — J209 Acute bronchitis, unspecified: Secondary | ICD-10-CM | POA: Insufficient documentation

## 2012-07-02 DIAGNOSIS — I10 Essential (primary) hypertension: Secondary | ICD-10-CM | POA: Insufficient documentation

## 2012-07-02 DIAGNOSIS — E785 Hyperlipidemia, unspecified: Secondary | ICD-10-CM | POA: Insufficient documentation

## 2012-07-02 DIAGNOSIS — J40 Bronchitis, not specified as acute or chronic: Secondary | ICD-10-CM

## 2012-07-02 DIAGNOSIS — E119 Type 2 diabetes mellitus without complications: Secondary | ICD-10-CM | POA: Insufficient documentation

## 2012-07-02 DIAGNOSIS — Z86718 Personal history of other venous thrombosis and embolism: Secondary | ICD-10-CM | POA: Insufficient documentation

## 2012-07-02 DIAGNOSIS — Z9861 Coronary angioplasty status: Secondary | ICD-10-CM | POA: Insufficient documentation

## 2012-07-02 DIAGNOSIS — I252 Old myocardial infarction: Secondary | ICD-10-CM | POA: Insufficient documentation

## 2012-07-02 DIAGNOSIS — Z7982 Long term (current) use of aspirin: Secondary | ICD-10-CM | POA: Insufficient documentation

## 2012-07-02 DIAGNOSIS — I2510 Atherosclerotic heart disease of native coronary artery without angina pectoris: Secondary | ICD-10-CM | POA: Insufficient documentation

## 2012-07-02 DIAGNOSIS — Z8679 Personal history of other diseases of the circulatory system: Secondary | ICD-10-CM | POA: Insufficient documentation

## 2012-07-02 DIAGNOSIS — Z79899 Other long term (current) drug therapy: Secondary | ICD-10-CM | POA: Insufficient documentation

## 2012-07-02 DIAGNOSIS — Z7901 Long term (current) use of anticoagulants: Secondary | ICD-10-CM | POA: Insufficient documentation

## 2012-07-02 DIAGNOSIS — R059 Cough, unspecified: Secondary | ICD-10-CM | POA: Insufficient documentation

## 2012-07-02 DIAGNOSIS — Z9089 Acquired absence of other organs: Secondary | ICD-10-CM | POA: Insufficient documentation

## 2012-07-02 MED ORDER — HYDROCOD POLST-CHLORPHEN POLST 10-8 MG/5ML PO LQCR
5.0000 mL | Freq: Once | ORAL | Status: AC
  Administered 2012-07-02: 5 mL via ORAL
  Filled 2012-07-02: qty 5

## 2012-07-02 MED ORDER — ALBUTEROL SULFATE HFA 108 (90 BASE) MCG/ACT IN AERS
2.0000 | INHALATION_SPRAY | RESPIRATORY_TRACT | Status: DC | PRN
  Administered 2012-07-02: 2 via RESPIRATORY_TRACT
  Filled 2012-07-02: qty 6.7

## 2012-07-02 MED ORDER — ALBUTEROL SULFATE HFA 108 (90 BASE) MCG/ACT IN AERS: 1.0000 | INHALATION_SPRAY | Freq: Four times a day (QID) | RESPIRATORY_TRACT | Status: DC | PRN

## 2012-07-02 MED ORDER — AZITHROMYCIN 250 MG PO TABS: 250.0000 mg | ORAL_TABLET | Freq: Every day | ORAL | Status: DC

## 2012-07-02 MED ORDER — HYDROCODONE-ACETAMINOPHEN 7.5-325 MG/15ML PO SOLN: 10.0000 mL | Freq: Three times a day (TID) | ORAL | Status: DC | PRN

## 2012-07-02 NOTE — ED Provider Notes (Signed)
History     CSN: [id number]  Arrival date & time 07/02/12  2200   First MD Initiated Contact with Patient 07/02/12 2306      Chief Complaint  Patient presents with  . Flank Pain  . Cough    (Consider location/radiation/quality/duration/timing/severity/associated sxs/prior treatment) HPI Hx per PT - cough with rib pain when he coughs. Smokes tob and denies h/o COPD. Followed by [facility] for h/o CHF, A fib, DM, HLD, MI.  Cough going on for about 6 weeks. No hemoptysis. No productive cough.  No SOB. HAs been seen in the ER for this last month and returns tonight because he ran out of Oxycodone. No PCP.  No leg pain or swelling. No wt gain.  Past Medical History  Diagnosis Date  . Cholecystitis 04/22/2010    acute s/p cholecystectomy  . Gallstone pancreatitis 04/22/10  . Atrial flutter     s/p CTI ablation by [facility] 08/05/10  . Diabetes mellitus type II   . Hyperlipidemia   . HTN (hypertension)   . Diastolic CHF, chronic     preserved EF  . Atrial fibrillation     with rapid ventricular response status post direct current cardioversion on December 07, 2010, to normal sinus rhythm  . Tobacco abuse   . Cervical radiculopathy   . Cervical radiculopathy   . Coronary artery disease     Est. EF of 55% -- History of nonobstructive coronary artery disease by cath in January 2012  . Heart palpitations   . History of MI (myocardial infarction)   . History of DVT (deep vein thrombosis)     Past Surgical History  Procedure Laterality Date  . Cholecystectomy  1/12  . Atrial ablation surgery  08/05/10    CTI ablation for atrial flutter by [facility]  . Cardioversion  12/07/2010     Successful direct current cardioversion with atrial fibrillation to normal sinus rhythm  . Cardiac catheterization    . Subtotal gastrectomy      Family History  Problem Relation Age of Onset  . Cancer Mother   . Heart attack Father   . Heart attack Brother     History  Substance Use Topics  . Smoking status:  Current Every Day Smoker -- 0.50 packs/day for 50 years    Types: Cigarettes  . Smokeless tobacco: Never Used  . Alcohol Use: No      Review of Systems  Constitutional: Negative for fever and chills.  HENT: Negative for neck pain and neck stiffness.   Eyes: Negative for visual disturbance.  Respiratory: Positive for cough.   Cardiovascular: Positive for chest pain. Negative for palpitations and leg swelling.  Gastrointestinal: Negative for abdominal pain.  Genitourinary: Negative for dysuria.  Musculoskeletal: Negative for back pain.  Skin: Negative for rash.  Neurological: Negative for headaches.  All other systems reviewed and are negative.    Allergies  Review of patient's allergies indicates no known allergies.  Home Medications   Current Outpatient Rx  Name  Route  Sig  Dispense  Refill  . aspirin EC 81 MG tablet   Oral   Take 81 mg by mouth every morning.         . diltiazem (CARDIZEM CD) 240 MG 24 hr capsule   Oral   Take 240 mg by mouth every morning.         . flecainide (TAMBOCOR) 150 MG tablet   Oral   Take 150 mg by mouth 2 (two) times daily.         [name]  insulin glargine (LANTUS SOLOSTAR) 100 UNIT/ML injection   Subcutaneous   Inject 10-27 Units into the skin at bedtime. Patient does 27u around 10pm and 10 units a few hours later         . loratadine (CLARITIN) 10 MG tablet   Oral   Take 10 mg by mouth every morning.          . metFORMIN (GLUCOPHAGE) 1000 MG tablet   Oral   Take 1,000 mg by mouth 2 (two) times daily with a meal.          . metoprolol (TOPROL-XL) 50 MG 24 hr tablet   Oral   Take 100 mg by mouth 2 (two) times daily.          . nitroGLYCERIN (NITROSTAT) 0.4 MG SL tablet   Sublingual   Place 0.4 mg under the tongue every 5 (five) minutes x 3 doses as needed. For chest pain.         [name] oxyCODONE-acetaminophen (PERCOCET) 5-325 MG per tablet   Oral   Take 1 tablet by mouth every 4 (four) hours as needed for pain.   20  tablet   0   . pravastatin (PRAVACHOL) 20 MG tablet   Oral   Take 20 mg by mouth every morning.          . warfarin (COUMADIN) 5 MG tablet   Oral   Take 5-7.5 mg by mouth at bedtime. 1.5 tablets on Sunday, Tuesday, and Thursday. 1 tablet on all other days           BP 134/72  Pulse 84  Temp(Src) 98.9 F (37.2 C) (Oral)  Resp 18  SpO2 96%  Physical Exam  Constitutional: He is oriented to person, place, and time. He appears well-developed and well-nourished.  HENT:  Head: Normocephalic and atraumatic.  Mouth/Throat: Oropharynx is clear and moist. No oropharyngeal exudate.  Eyes: EOM are normal. Pupils are equal, round, and reactive to light.  Neck: Neck supple.  Cardiovascular: Normal rate, regular rhythm and intact distal pulses.   Pulmonary/Chest: Effort normal. No stridor. No respiratory distress.  mildly dec breath sounds and prolonged expirations. Tenderness bilateral lower ribs no crepitus.   Abdominal: Soft. Bowel sounds are normal. He exhibits no distension. There is no tenderness.  Musculoskeletal: Normal range of motion. He exhibits no edema and no tenderness.  Calves nontender  Neurological: He is alert and oriented to person, place, and time.  Skin: Skin is warm and dry.    ED Course  Procedures (including critical care time)  Results for orders placed during the hospital encounter of 07/02/12  URINALYSIS, ROUTINE W REFLEX MICROSCOPIC      Result Value Range   Color, Urine YELLOW  YELLOW   APPearance CLEAR  CLEAR   Specific Gravity, Urine 1.022  1.005 - 1.030   pH 5.5  5.0 - 8.0   Glucose, UA NEGATIVE  NEGATIVE mg/dL   Hgb urine dipstick NEGATIVE  NEGATIVE   Bilirubin Urine NEGATIVE  NEGATIVE   Ketones, ur NEGATIVE  NEGATIVE mg/dL   Protein, ur NEGATIVE  NEGATIVE mg/dL   Urobilinogen, UA 1.0  0.0 - 1.0 mg/dL   Nitrite NEGATIVE  NEGATIVE   Leukocytes, UA MODERATE (*) NEGATIVE  URINE MICROSCOPIC-ADD ON      Result Value Range   Squamous Epithelial  / LPF RARE  RARE   WBC, UA 3-6  <3 WBC/hpf   RBC / HPF 0-2  <3 RBC/hpf   Dg Chest 2 View  07/02/2012  *RADIOLOGY REPORT*  Clinical Data: Cough and chest pain.  CHEST - 2 VIEW  Comparison: Chest radiograph performed 05/29/2012, and CT of the chest performed 12/19/2011  Findings: The lungs are well-aerated.  Chronically increased interstitial markings are seen, with chronic peribronchial thickening.  Known tiny bilateral pulmonary nodules are better seen on prior CT.  No definite focal superimposed airspace consolidation is identified.  No pleural effusion or pneumothorax is seen.  The heart is normal in size; the mediastinal contour is within normal limits.  No acute osseous abnormalities are seen. Postoperative change is noted about the gastroesophageal junction.  IMPRESSION: Chronically increased interstitial markings and chronic peribronchial thickening; no acute focal airspace consolidation seen.  Known tiny bilateral pulmonary nodules are better seen on prior CT.   Original Report Authenticated By: Tonia Ghent, M.D.       Date: 07/02/2012  Rate: 77  Rhythm: normal sinus rhythm  QRS Axis: left  Intervals: PR prolonged  ST/T Wave abnormalities: nonspecific ST changes  Conduction Disutrbances:first-degree A-V block   Narrative Interpretation:   Old EKG Reviewed: unchanged  albuterol treatment and inhaler provided  Plan prescription for antitussives and antibiotics, close outpatient followup. Return precautions verbalized as understood  MDM  Cough and rib discomfort for 6 weeks, treated for bronchitis.  Evaluated with EKG chest x-ray reviewed as above  Vital signs nursing notes reviewed and considered.    Sunnie Nielsen, MD 07/03/12 0130

## 2012-07-02 NOTE — ED Notes (Addendum)
Per EMS, pt with generalized chronic pain.  However has had rt sided flank pain x 2 weeks.  No chest pain, no SOB.  Also c/o cough and rib cage pain.  Has seen cardiology and was cleared.  Ran out of pain meds on Monday.  Hx DM, a-fib, HTN  Vitals: 121/51, 96% ra, hr 84.  Pt from home.

## 2012-07-02 NOTE — ED Notes (Signed)
Pt c/o pain to Rt posterior lower ribs / flank area; pt states that was seen approx 1 month ago for same and was given some "pain pills"; pt reports that he is out of his medications and the pain is still there; pt states pain is worse upon movement or deep inhalation; pt denies urinary symptoms.

## 2012-07-02 NOTE — ED Notes (Signed)
Also c/o shoulder blade pain.  Getting baseline EKG d/t Cardiac hx.

## 2012-07-03 LAB — URINALYSIS, ROUTINE W REFLEX MICROSCOPIC
Glucose, UA: NEGATIVE mg/dL
Nitrite: NEGATIVE
Protein, ur: NEGATIVE mg/dL
pH: 5.5 (ref 5.0–8.0)

## 2012-07-03 LAB — URINE MICROSCOPIC-ADD ON

## 2012-07-13 IMAGING — CR DG CHEST 2V
2 series · 2 of 2 positions shown · non-contrast
Comparison: 01/21/2011

CLINICAL DATA: Hyperglycemia

CHEST - 2 VIEW

[w chest pa]
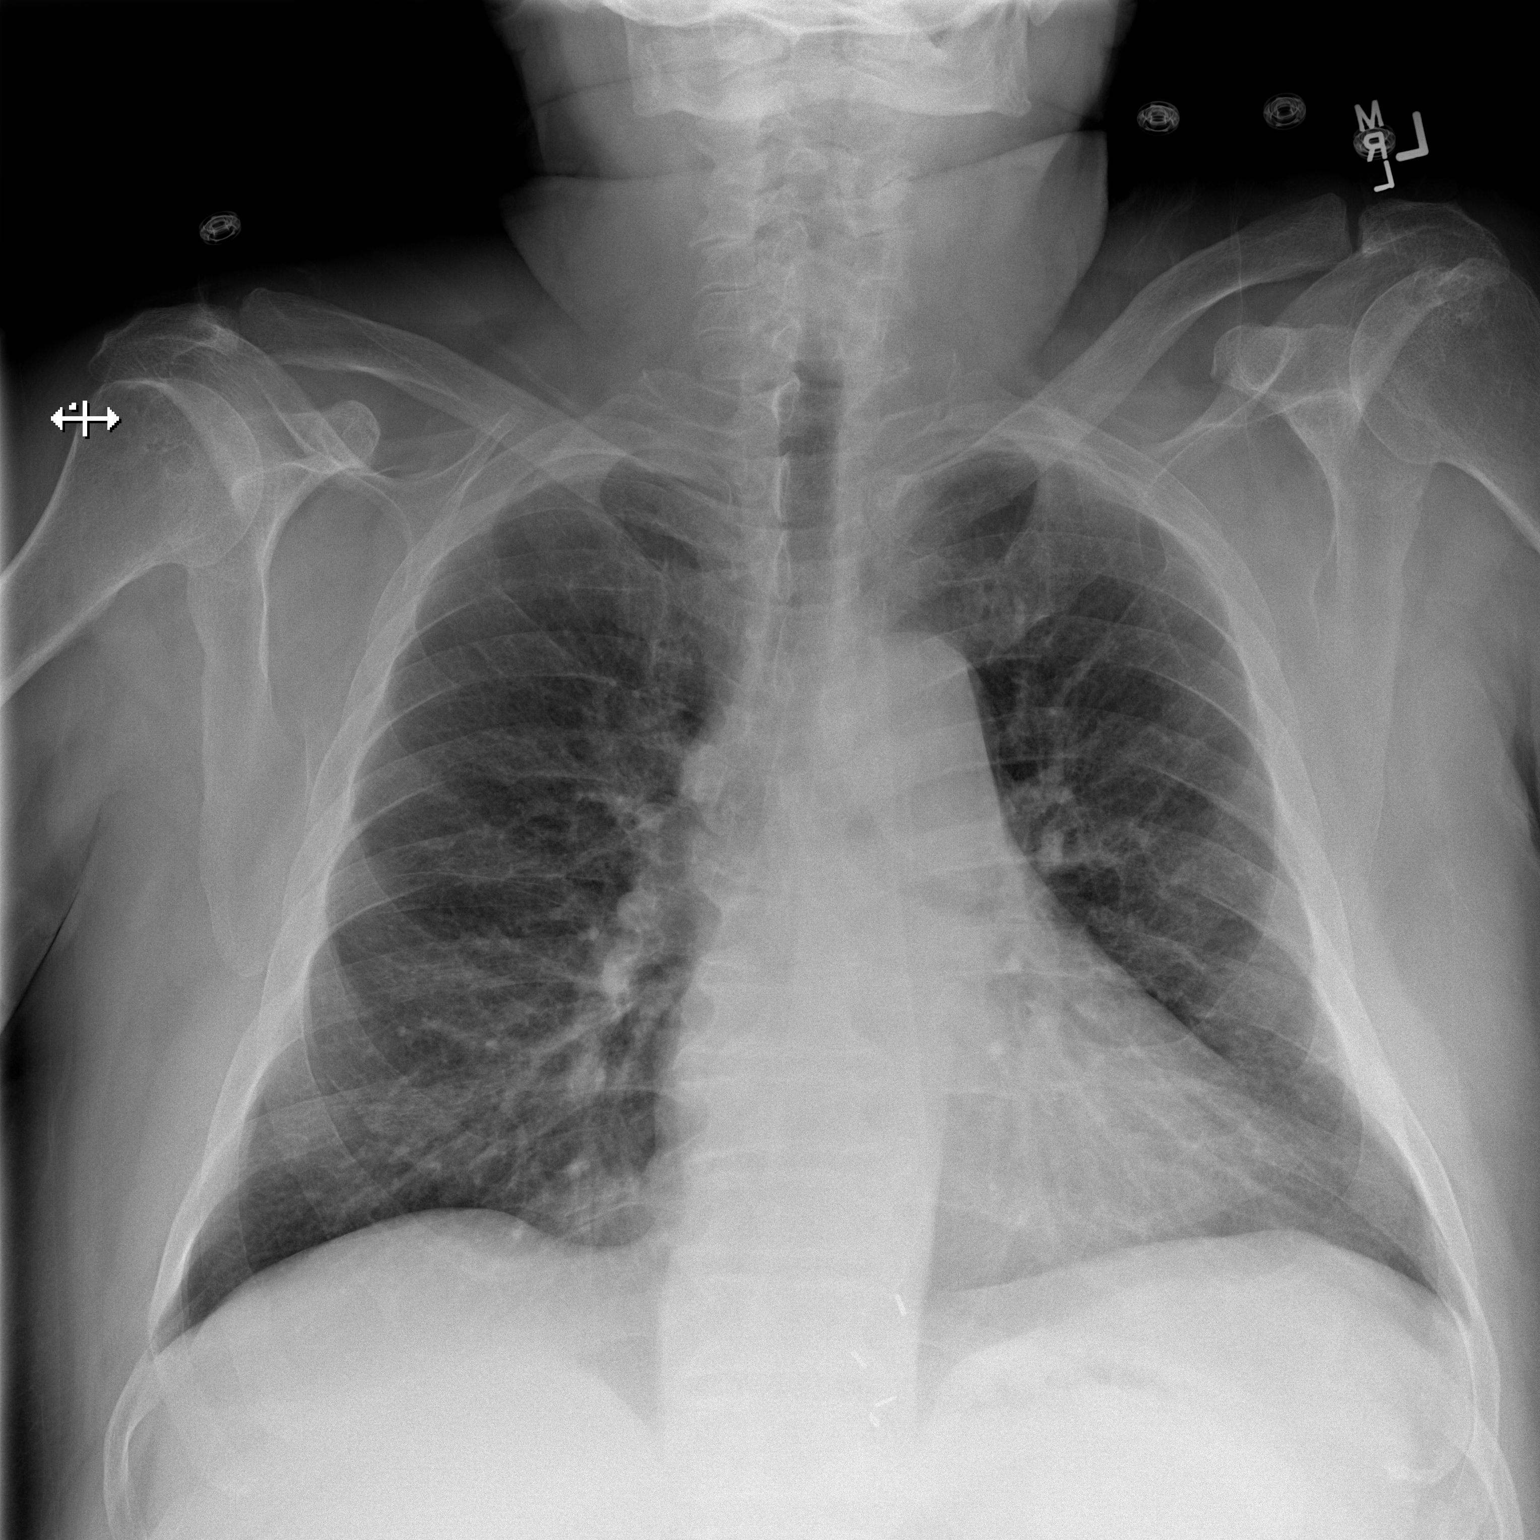

[w chest lat]
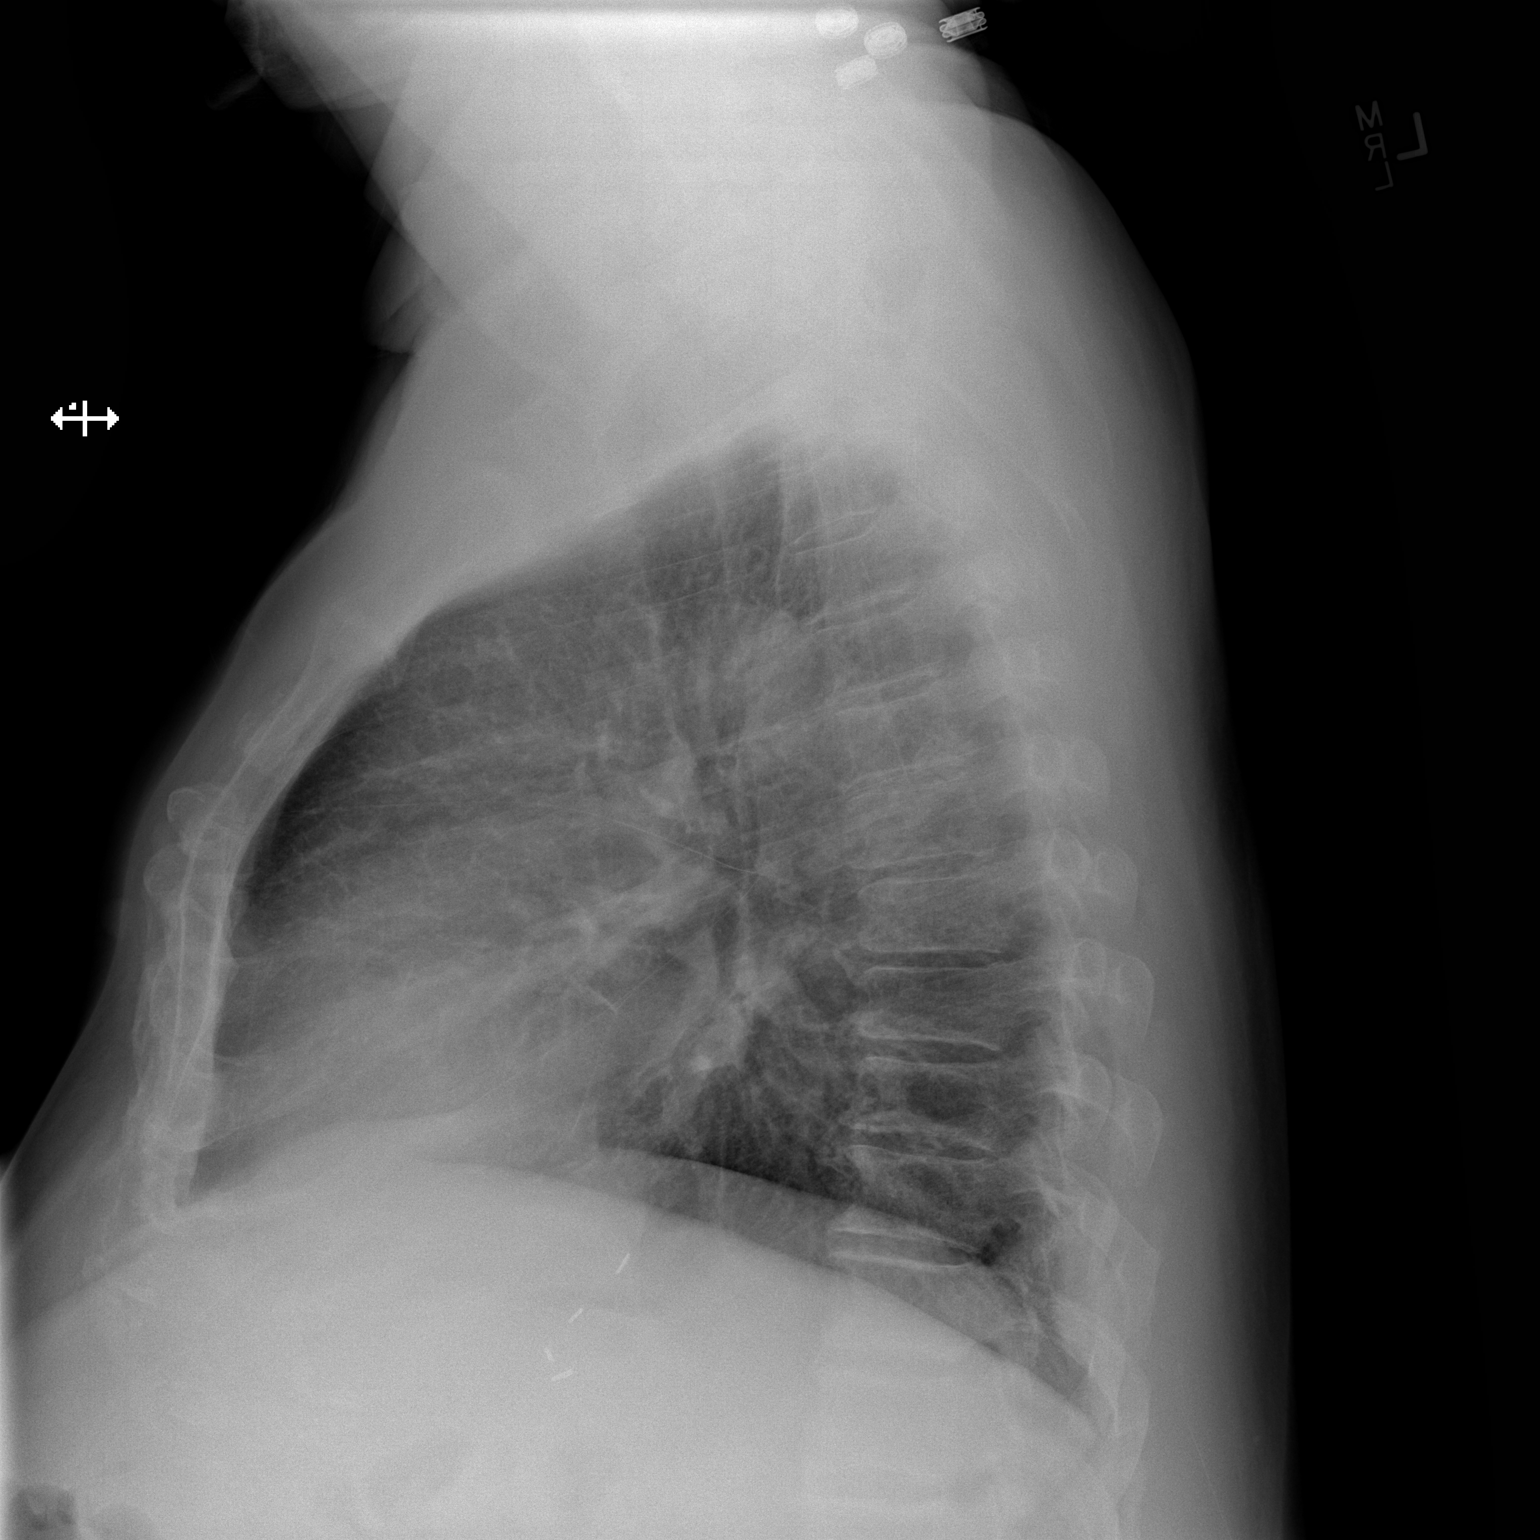

[2 of 2 positions shown; findings below may reference images not displayed]

FINDINGS: Lungs are clear. No pleural effusion or pneumothorax.

Cardiomediastinal silhouette is within normal limits.

Degenerative changes of the visualized thoracolumbar spine.

Surgical clips at the GE junction.
IMPRESSION: No evidence of acute cardiopulmonary disease.

## 2012-08-09 ENCOUNTER — Ambulatory Visit: Payer: Medicare Other | Admitting: Pharmacist Clinician (PhC)/ Clinical Pharmacy Specialist

## 2012-08-23 ENCOUNTER — Ambulatory Visit (INDEPENDENT_AMBULATORY_CARE_PROVIDER_SITE_OTHER): Payer: Medicare Other | Admitting: Pharmacist Clinician (PhC)/ Clinical Pharmacy Specialist

## 2012-08-23 VITALS — BP 120/64 | HR 80

## 2012-08-23 DIAGNOSIS — I4892 Unspecified atrial flutter: Secondary | ICD-10-CM

## 2012-08-23 DIAGNOSIS — I4891 Unspecified atrial fibrillation: Secondary | ICD-10-CM

## 2012-08-23 DIAGNOSIS — Z7901 Long term (current) use of anticoagulants: Secondary | ICD-10-CM

## 2012-08-23 LAB — POCT INR: INR: 2.7

## 2012-09-05 ENCOUNTER — Other Ambulatory Visit: Payer: Self-pay | Admitting: *Deleted

## 2012-09-05 MED ORDER — FLECAINIDE ACETATE 150 MG PO TABS: ORAL_TABLET | ORAL | Status: DC

## 2012-09-05 NOTE — Telephone Encounter (Signed)
Refilled flecanide to [facility].

## 2012-09-09 ENCOUNTER — Other Ambulatory Visit: Payer: Self-pay | Admitting: *Deleted

## 2012-09-09 MED ORDER — FLECAINIDE ACETATE 150 MG PO TABS: ORAL_TABLET | ORAL | Status: DC

## 2012-09-11 ENCOUNTER — Other Ambulatory Visit: Payer: Self-pay | Admitting: Pharmacist Clinician (PhC)/ Clinical Pharmacy Specialist

## 2012-09-20 ENCOUNTER — Ambulatory Visit (INDEPENDENT_AMBULATORY_CARE_PROVIDER_SITE_OTHER): Payer: Medicare Other | Admitting: Pharmacist Clinician (PhC)/ Clinical Pharmacy Specialist

## 2012-09-20 VITALS — BP 116/60 | HR 76

## 2012-09-20 DIAGNOSIS — I4891 Unspecified atrial fibrillation: Secondary | ICD-10-CM

## 2012-09-20 DIAGNOSIS — Z7901 Long term (current) use of anticoagulants: Secondary | ICD-10-CM

## 2012-09-20 DIAGNOSIS — I4892 Unspecified atrial flutter: Secondary | ICD-10-CM

## 2012-09-24 ENCOUNTER — Encounter (HOSPITAL_COMMUNITY): Payer: Self-pay

## 2012-09-24 ENCOUNTER — Emergency Department (HOSPITAL_COMMUNITY): Payer: Medicare Other

## 2012-09-24 ENCOUNTER — Emergency Department (HOSPITAL_COMMUNITY)
Admission: EM | Admit: 2012-09-24 | Discharge: 2012-09-24 | Disposition: A | Discharge status: Home / Self Care | Payer: Medicare Other | Attending: Emergency Medicine | Admitting: Emergency Medicine

## 2012-09-24 DIAGNOSIS — E119 Type 2 diabetes mellitus without complications: Secondary | ICD-10-CM | POA: Insufficient documentation

## 2012-09-24 DIAGNOSIS — Z794 Long term (current) use of insulin: Secondary | ICD-10-CM | POA: Insufficient documentation

## 2012-09-24 DIAGNOSIS — S4980XA Other specified injuries of shoulder and upper arm, unspecified arm, initial encounter: Secondary | ICD-10-CM | POA: Insufficient documentation

## 2012-09-24 DIAGNOSIS — I4891 Unspecified atrial fibrillation: Secondary | ICD-10-CM | POA: Insufficient documentation

## 2012-09-24 DIAGNOSIS — M25512 Pain in left shoulder: Secondary | ICD-10-CM

## 2012-09-24 DIAGNOSIS — I5032 Chronic diastolic (congestive) heart failure: Secondary | ICD-10-CM | POA: Insufficient documentation

## 2012-09-24 DIAGNOSIS — I252 Old myocardial infarction: Secondary | ICD-10-CM | POA: Insufficient documentation

## 2012-09-24 DIAGNOSIS — S46909A Unspecified injury of unspecified muscle, fascia and tendon at shoulder and upper arm level, unspecified arm, initial encounter: Secondary | ICD-10-CM | POA: Insufficient documentation

## 2012-09-24 DIAGNOSIS — I2510 Atherosclerotic heart disease of native coronary artery without angina pectoris: Secondary | ICD-10-CM | POA: Insufficient documentation

## 2012-09-24 DIAGNOSIS — Z7901 Long term (current) use of anticoagulants: Secondary | ICD-10-CM | POA: Insufficient documentation

## 2012-09-24 DIAGNOSIS — Z8739 Personal history of other diseases of the musculoskeletal system and connective tissue: Secondary | ICD-10-CM | POA: Insufficient documentation

## 2012-09-24 DIAGNOSIS — Z7982 Long term (current) use of aspirin: Secondary | ICD-10-CM | POA: Insufficient documentation

## 2012-09-24 DIAGNOSIS — Z86718 Personal history of other venous thrombosis and embolism: Secondary | ICD-10-CM | POA: Insufficient documentation

## 2012-09-24 DIAGNOSIS — Y929 Unspecified place or not applicable: Secondary | ICD-10-CM | POA: Insufficient documentation

## 2012-09-24 DIAGNOSIS — E785 Hyperlipidemia, unspecified: Secondary | ICD-10-CM | POA: Insufficient documentation

## 2012-09-24 DIAGNOSIS — F17200 Nicotine dependence, unspecified, uncomplicated: Secondary | ICD-10-CM | POA: Insufficient documentation

## 2012-09-24 DIAGNOSIS — Z79899 Other long term (current) drug therapy: Secondary | ICD-10-CM | POA: Insufficient documentation

## 2012-09-24 DIAGNOSIS — Z8719 Personal history of other diseases of the digestive system: Secondary | ICD-10-CM | POA: Insufficient documentation

## 2012-09-24 DIAGNOSIS — Y939 Activity, unspecified: Secondary | ICD-10-CM | POA: Insufficient documentation

## 2012-09-24 DIAGNOSIS — W19XXXA Unspecified fall, initial encounter: Secondary | ICD-10-CM | POA: Insufficient documentation

## 2012-09-24 DIAGNOSIS — I10 Essential (primary) hypertension: Secondary | ICD-10-CM | POA: Insufficient documentation

## 2012-09-24 MED ORDER — HYDROCODONE-ACETAMINOPHEN 5-325 MG PO TABS
1.0000 | ORAL_TABLET | Freq: Once | ORAL | Status: AC
  Administered 2012-09-24: 1 via ORAL
  Filled 2012-09-24: qty 1

## 2012-09-24 MED ORDER — HYDROCODONE-ACETAMINOPHEN 5-325 MG PO TABS: 1.0000 | ORAL_TABLET | ORAL | Status: DC | PRN

## 2012-09-24 NOTE — ED Provider Notes (Signed)
Medical screening examination/treatment/procedure(s) were conducted as a shared visit with non-physician practitioner(s) and myself.  I personally evaluated the patient during the encounter   Rhythm Gubbels, MD 09/24/12 1459 

## 2012-09-24 NOTE — ED Notes (Addendum)
Pt c/o Left shoulder pain.  VSS.  12 lead SR with 1st degree AVB.  BP  117/59.  P  82.  93% RA.  Pt took 324 mg Aspirin prior to EMS arrival.

## 2012-09-24 NOTE — ED Provider Notes (Signed)
History    CSN: [id number] Arrival date & time 09/24/12  9147  First MD Initiated Contact with Patient 09/24/12 [phone]     Chief Complaint  Patient presents with  . Shoulder Pain   (Consider location/radiation/quality/duration/timing/severity/associated sxs/prior Treatment) Patient is a 72 y.o. male presenting with shoulder pain. The history is provided by the patient. No language interpreter was used.  Shoulder Pain This is a recurrent problem. Associated symptoms include arthralgias. Pertinent negatives include no abdominal pain, chest pain, chills, fever, neck pain or numbness. Associated symptoms comments: Left shoulder pain that is recurrent. No chest pain, shortness of breath, cough or fever. He reports he fell 3 weeks ago and injured shoulder but has not followed up with the orthopedist yet. .   Past Medical History  Diagnosis Date  . Cholecystitis 04/22/2010    acute s/p cholecystectomy  . Gallstone pancreatitis 04/22/10  . Atrial flutter     s/p CTI ablation by [facility] 08/05/10  . Diabetes mellitus type II   . Hyperlipidemia   . HTN (hypertension)   . Diastolic CHF, chronic     preserved EF  . Atrial fibrillation     with rapid ventricular response status post direct current cardioversion on December 07, 2010, to normal sinus rhythm  . Tobacco abuse   . Cervical radiculopathy   . Cervical radiculopathy   . Coronary artery disease     Est. EF of 55% -- History of nonobstructive coronary artery disease by cath in January 2012  . Heart palpitations   . History of MI (myocardial infarction)   . History of DVT (deep vein thrombosis)    Past Surgical History  Procedure Laterality Date  . Cholecystectomy  1/12  . Atrial ablation surgery  08/05/10    CTI ablation for atrial flutter by [facility]  . Cardioversion  12/07/2010     Successful direct current cardioversion with atrial fibrillation to normal sinus rhythm  . Cardiac catheterization    . Subtotal gastrectomy     Family  History  Problem Relation Age of Onset  . Cancer Mother   . Heart attack Father   . Heart attack Brother    History  Substance Use Topics  . Smoking status: Current Every Day Smoker -- 0.50 packs/day for 50 years    Types: Cigarettes  . Smokeless tobacco: Never Used  . Alcohol Use: No    Review of Systems  Constitutional: Negative for fever and chills.  HENT: Negative for neck pain.   Respiratory: Negative.  Negative for shortness of breath.   Cardiovascular: Negative.  Negative for chest pain.  Gastrointestinal: Negative.  Negative for abdominal pain.  Musculoskeletal: Positive for arthralgias.       See HPI  Skin: Negative.  Negative for wound.  Neurological: Negative.  Negative for numbness.    Allergies  Review of patient's allergies indicates no known allergies.  Home Medications   Current Outpatient Rx  Name  Route  Sig  Dispense  Refill  . albuterol (PROVENTIL HFA;VENTOLIN HFA) 108 (90 BASE) MCG/ACT inhaler   Inhalation   Inhale 1-2 puffs into the lungs every 6 (six) hours as needed for wheezing.   1 Inhaler   0   . aspirin EC 81 MG tablet   Oral   Take 81 mg by mouth every morning.         . diltiazem (CARDIZEM CD) 240 MG 24 hr capsule   Oral   Take 240 mg by mouth every morning.         [name]  flecainide (TAMBOCOR) 150 MG tablet      Take 1 and 1/2 tablet twice daily.   270 tablet   3   . insulin glargine (LANTUS SOLOSTAR) 100 UNIT/ML injection   Subcutaneous   Inject 10-27 Units into the skin at bedtime. Patient does 27u around 10pm and 10 units a few hours later         . loratadine (CLARITIN) 10 MG tablet   Oral   Take 10 mg by mouth every morning.          . metFORMIN (GLUCOPHAGE) 1000 MG tablet   Oral   Take 1,000 mg by mouth 2 (two) times daily with a meal.          . metoprolol (TOPROL-XL) 50 MG 24 hr tablet   Oral   Take 100 mg by mouth 2 (two) times daily.          . pravastatin (PRAVACHOL) 20 MG tablet   Oral   Take 20  mg by mouth at bedtime.          [name] warfarin (COUMADIN) 5 MG tablet   Oral   Take 5-7.5 mg by mouth at bedtime. 1.5 tablets on Sunday, Tuesday, and Thursday. 1 tablet on all other days         . Aspirin 324 MG TBEF   Oral   Take 4 tablets by mouth once.          . nitroGLYCERIN (NITROSTAT) 0.4 MG SL tablet   Sublingual   Place 0.4 mg under the tongue every 5 (five) minutes x 3 doses as needed. For chest pain.          BP 118/76  Pulse 79  Temp(Src) 97.6 F (36.4 C) (Oral)  Resp 18  SpO2 97% Physical Exam  Constitutional: He is oriented to person, place, and time. He appears well-developed and well-nourished. No distress.  HENT:  Head: Normocephalic.  Neck: Normal range of motion. Neck supple.  Cardiovascular: Normal rate, regular rhythm and intact distal pulses.   Pulmonary/Chest: Effort normal and breath sounds normal.  Abdominal: Soft. Bowel sounds are normal. There is no tenderness. There is no rebound and no guarding.  Musculoskeletal: Normal range of motion.  FROM left shoulder with pain on full range. No swelling or discoloration. Shoulder tender over deltoid and superiorly. No strength deficits.   Neurological: He is alert and oriented to person, place, and time.  Skin: Skin is warm and dry. No rash noted.  Psychiatric: He has a normal mood and affect.    ED Course  Procedures (including critical care time) Labs Reviewed - No data to display Dg Shoulder Left  09/24/2012   *RADIOLOGY REPORT*  Clinical Data: Fall, left shoulder pain  LEFT SHOULDER - 2+ VIEW  Comparison: None.  Findings: No fracture or dislocation is seen.  Sclerosis along the posterolateral humeral head may reflect a prior Hill-Sachs deformity.  The joint spaces are preserved.  The visualized soft tissues are unremarkable.  Visualized left lung is essentially clear.  IMPRESSION: No fracture or dislocation is seen.   Original Report Authenticated By: Charline Bills, M.D.   No diagnosis  found. 1. Left shoulder pain  MDM  Shoulder pain that is musculoskeletal. No chest pain, unchanged EKG. Better with Norco.  He reports he is making an appointment with [facility].   Arnoldo Hooker, PA-C 09/24/12 1103

## 2012-09-24 NOTE — ED Notes (Signed)
Pt to xray

## 2012-10-11 ENCOUNTER — Ambulatory Visit: Payer: Medicare Other | Admitting: Pharmacist Clinician (PhC)/ Clinical Pharmacy Specialist

## 2012-11-08 ENCOUNTER — Ambulatory Visit: Payer: Medicare Other | Admitting: Pharmacist Clinician (PhC)/ Clinical Pharmacy Specialist

## 2012-11-28 ENCOUNTER — Encounter: Payer: Self-pay | Admitting: *Deleted

## 2012-11-29 ENCOUNTER — Encounter (INDEPENDENT_AMBULATORY_CARE_PROVIDER_SITE_OTHER): Payer: Self-pay | Admitting: Ophthalmology

## 2012-12-02 ENCOUNTER — Other Ambulatory Visit: Payer: Self-pay | Admitting: Pharmacist Clinician (PhC)/ Clinical Pharmacy Specialist

## 2012-12-02 ENCOUNTER — Ambulatory Visit (INDEPENDENT_AMBULATORY_CARE_PROVIDER_SITE_OTHER): Payer: Medicare Other | Admitting: Cardiology

## 2012-12-02 ENCOUNTER — Encounter: Payer: Self-pay | Admitting: Cardiology

## 2012-12-02 ENCOUNTER — Ambulatory Visit (INDEPENDENT_AMBULATORY_CARE_PROVIDER_SITE_OTHER): Payer: Medicare Other | Admitting: Pharmacist Clinician (PhC)/ Clinical Pharmacy Specialist

## 2012-12-02 VITALS — BP 134/72 | HR 63 | Ht 66.0 in | Wt 208.7 lb

## 2012-12-02 DIAGNOSIS — E785 Hyperlipidemia, unspecified: Secondary | ICD-10-CM

## 2012-12-02 DIAGNOSIS — Z716 Tobacco abuse counseling: Secondary | ICD-10-CM

## 2012-12-02 DIAGNOSIS — I4892 Unspecified atrial flutter: Secondary | ICD-10-CM

## 2012-12-02 DIAGNOSIS — E669 Obesity, unspecified: Secondary | ICD-10-CM

## 2012-12-02 DIAGNOSIS — I10 Essential (primary) hypertension: Secondary | ICD-10-CM

## 2012-12-02 DIAGNOSIS — I509 Heart failure, unspecified: Secondary | ICD-10-CM

## 2012-12-02 DIAGNOSIS — I4891 Unspecified atrial fibrillation: Secondary | ICD-10-CM

## 2012-12-02 DIAGNOSIS — I5032 Chronic diastolic (congestive) heart failure: Secondary | ICD-10-CM

## 2012-12-02 DIAGNOSIS — I2510 Atherosclerotic heart disease of native coronary artery without angina pectoris: Secondary | ICD-10-CM

## 2012-12-02 DIAGNOSIS — Z7901 Long term (current) use of anticoagulants: Secondary | ICD-10-CM

## 2012-12-02 DIAGNOSIS — Z7189 Other specified counseling: Secondary | ICD-10-CM

## 2012-12-02 DIAGNOSIS — F17200 Nicotine dependence, unspecified, uncomplicated: Secondary | ICD-10-CM

## 2012-12-02 MED ORDER — WARFARIN SODIUM 5 MG PO TABS: ORAL_TABLET | ORAL | Status: DC

## 2012-12-02 NOTE — Patient Instructions (Signed)
Dr Herbie Baltimore is agreement with you taking Crestor. It is okay to get it fill.    Good job in losing weight continue the good work    Your physician wants you to follow-up in 6 month Dr Herbie Baltimore.  You will receive a reminder letter in the mail two months in advance. If you don't receive a letter, please call our office to schedule the follow-up appointment.

## 2012-12-03 ENCOUNTER — Encounter: Payer: Self-pay | Admitting: Cardiology

## 2012-12-13 ENCOUNTER — Encounter (INDEPENDENT_AMBULATORY_CARE_PROVIDER_SITE_OTHER): Payer: Medicare Other | Admitting: Ophthalmology

## 2012-12-13 DIAGNOSIS — E1139 Type 2 diabetes mellitus with other diabetic ophthalmic complication: Secondary | ICD-10-CM

## 2012-12-13 DIAGNOSIS — H26499 Other secondary cataract, unspecified eye: Secondary | ICD-10-CM

## 2012-12-13 DIAGNOSIS — E11319 Type 2 diabetes mellitus with unspecified diabetic retinopathy without macular edema: Secondary | ICD-10-CM

## 2012-12-13 DIAGNOSIS — H43819 Vitreous degeneration, unspecified eye: Secondary | ICD-10-CM

## 2012-12-13 DIAGNOSIS — H3530 Unspecified macular degeneration: Secondary | ICD-10-CM

## 2012-12-13 DIAGNOSIS — H31009 Unspecified chorioretinal scars, unspecified eye: Secondary | ICD-10-CM

## 2012-12-14 ENCOUNTER — Encounter: Payer: Self-pay | Admitting: Cardiology

## 2012-12-14 DIAGNOSIS — E669 Obesity, unspecified: Secondary | ICD-10-CM | POA: Insufficient documentation
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
## 2012-12-15 DIAGNOSIS — E785 Hyperlipidemia, unspecified: Secondary | ICD-10-CM | POA: Insufficient documentation

## 2012-12-15 DIAGNOSIS — Z716 Tobacco abuse counseling: Secondary | ICD-10-CM | POA: Insufficient documentation

## 2012-12-15 NOTE — Assessment & Plan Note (Signed)
I actually don't think this is a true diagnosis for him. He has not on, and no as he been on standing dose of diuretic. With preserved EF the option for rate control is, in addition of diltiazem plus metoprolol succinate. He is not on an ACE inhibitor, to allow blood pressure room to use diltiazem. If his blood pressure were to increase sufficiently, we could consider adding an ACE inhibitor.

## 2012-12-15 NOTE — Assessment & Plan Note (Signed)
Stable, no changes needed today. As mentioned, if his blood pressure were to increase, would add ACE inhibitor.

## 2012-12-15 NOTE — Assessment & Plan Note (Signed)
I spent a good 4-5 minutes talking to him about trying to get back on the band wagon as far as smoking cessation. He was doing so well at the time of his atrial flutter ablation and cholecystitis surgery.  I think he's got a lot of pressure on him from a household.  I actually scolded his daughter, reiterating to her that it is her father's health as well as for help and is in jeopardy, she is obviously the voice of reason but does not understand herself.  Again we talked about trying to use Nicoderm patches or electronic cigarettes. I not sure how far I got through with them this time.

## 2012-12-15 NOTE — Assessment & Plan Note (Signed)
Well-controlled during his last visit, the triglycerides were little high. LDL less than 100 is pretty much at goal.  I do agree, however with converting from pravastatin to Crestor for reducing the symptoms and for increased potency.

## 2012-12-15 NOTE — Assessment & Plan Note (Signed)
He has done a good job of weight loss, loss 8 pounds in the last 6 months. Another 8 pounds in a year he is close to his target weight loss for the year. He is making dietary modifications, and is picked up his level of exercise. He also cut back on his alcohol drinking from before his move.

## 2012-12-15 NOTE — Assessment & Plan Note (Signed)
INR checked today was 2.6. Followed by Phillips Hay, RPH.

## 2012-12-15 NOTE — Assessment & Plan Note (Signed)
No further episodes of A. fib RVR. I agree with Dr. Johney Frame that we should keep him in normal sinus rhythm. He is on stable dose of diltiazem (however Dr. Jenel Lucks notes at 180 mg twice a day, is currently at 240 mg once a day). He is on flecainide 150 mg twice a day.  He is finally starting to get INR stable and is being checked routinely here. Continues on warfarin. No active bleeding.

## 2012-12-19 IMAGING — CR DG CHEST 2V
2 series · 2 of 2 positions shown · non-contrast
Comparison: 07/15/2011.

CLINICAL DATA: Cough, fever, weakness.

CHEST - 2 VIEW

[w chest pa]
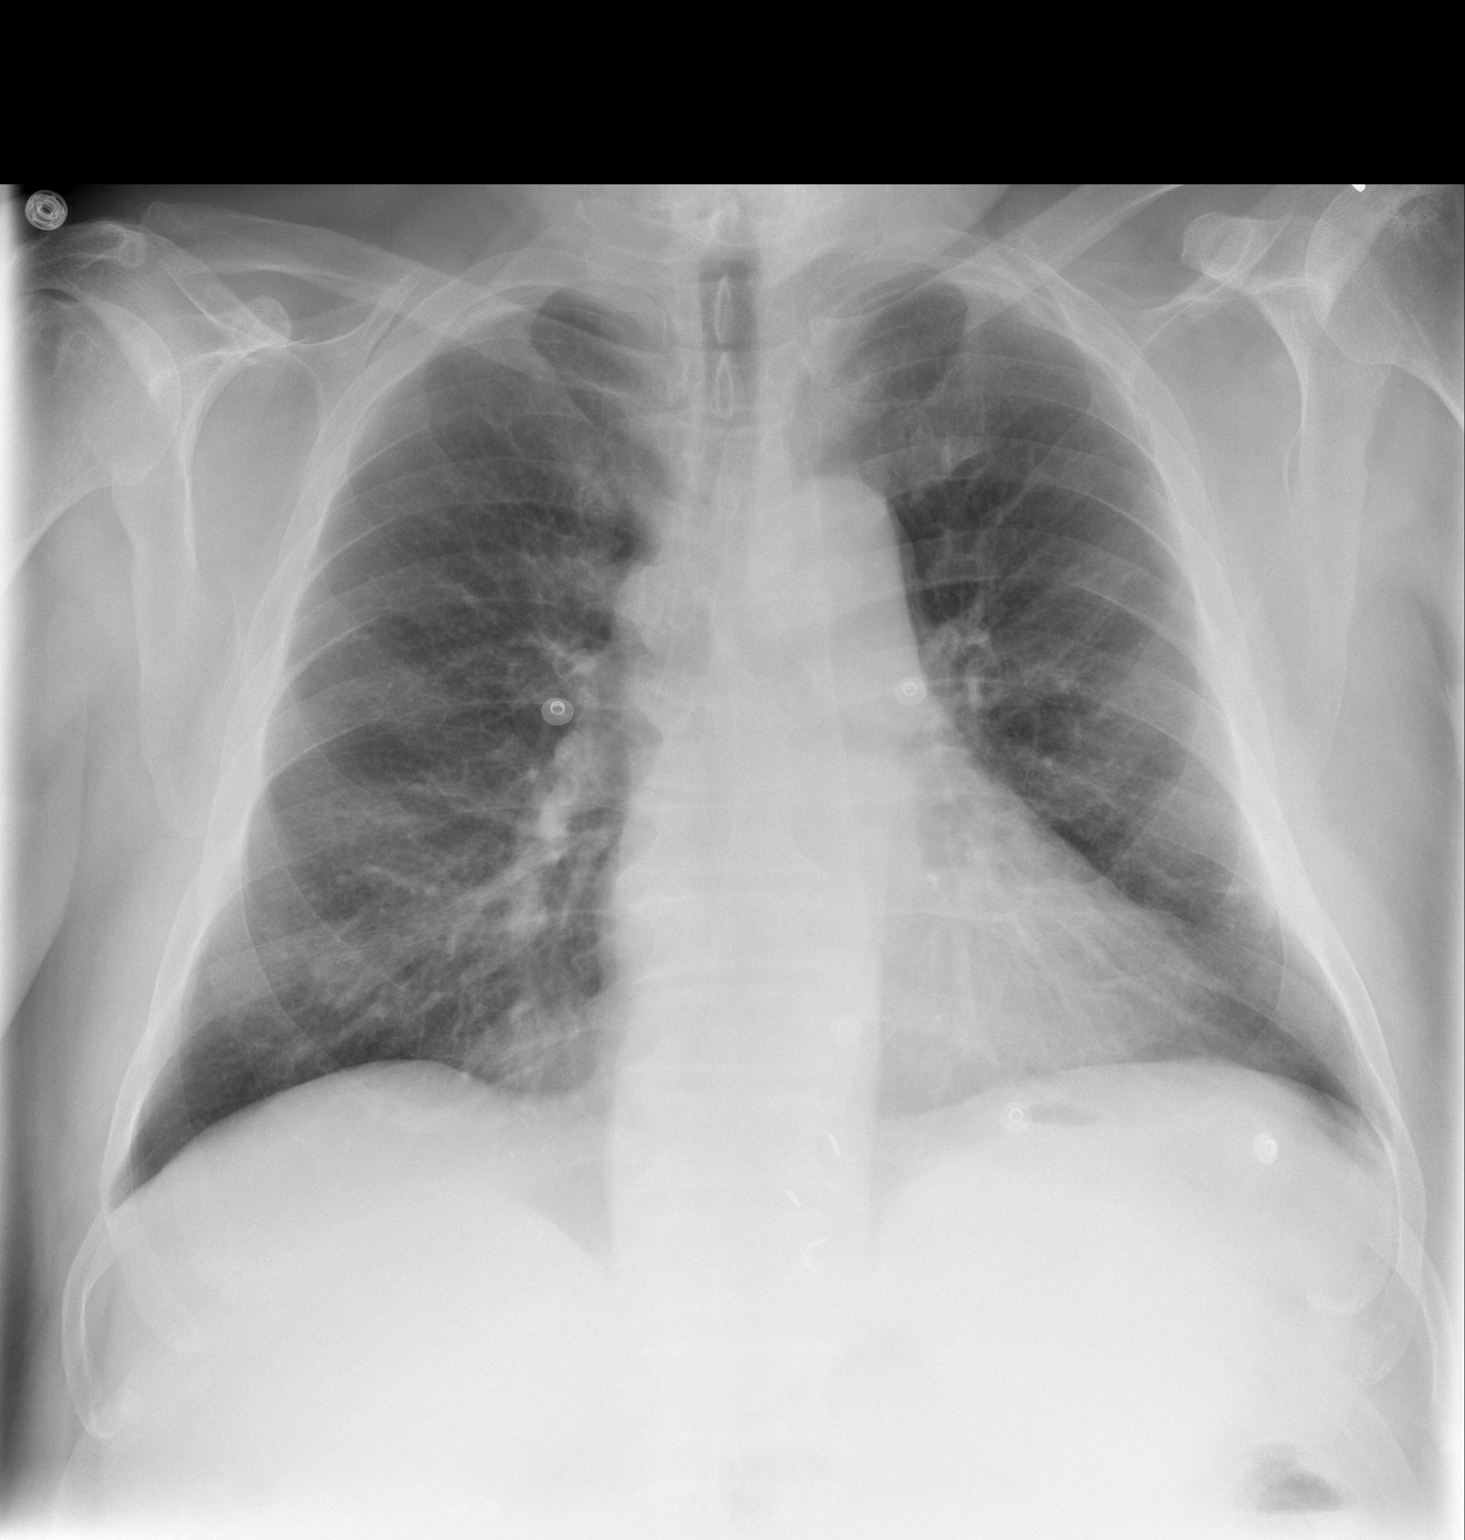

[w chest lat]
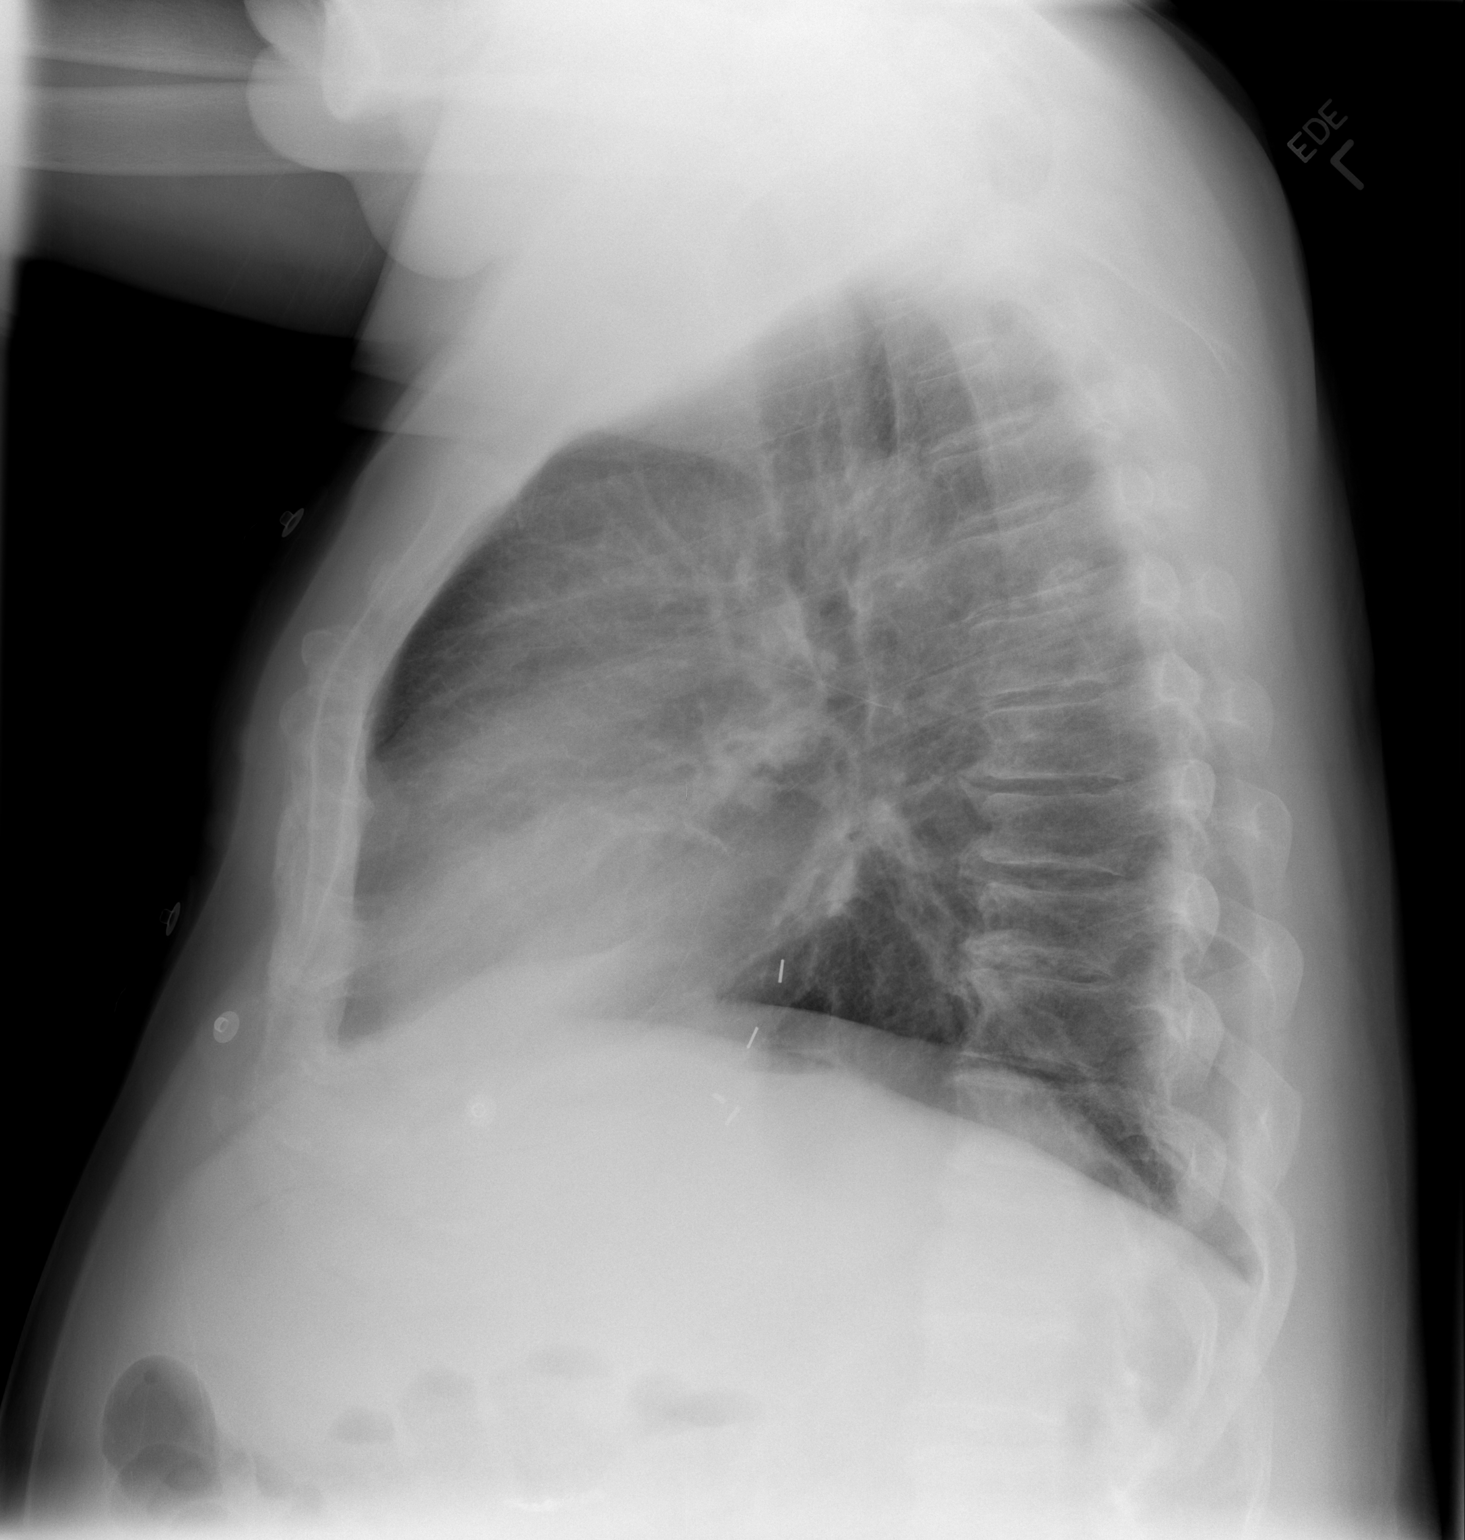

[2 of 2 positions shown; findings below may reference images not displayed]

FINDINGS: Trachea is midline.  Heart size stable.  Mild biapical
pleural thickening.  Left perihilar nodular density is questionably
present on 04/08/2010, not definitely seen on 03/23/2010.  Probable
linear scarring or atelectasis in the lingula.  Lungs are otherwise
clear.  No pleural fluid.  Degenerative changes are seen in the
spine.
IMPRESSION: 1.  No acute findings.
2.  Possible nodular density in the left perihilar region.  Non-
emergent CT chest without contrast could be performed in further
evaluation, as clinically indicated. These results were called by
telephone on 10/09/2011 at 2979 hours to Lipa Mamani, the patient's
nurse in [REDACTED], who verbally
acknowledged these results.

## 2012-12-27 ENCOUNTER — Encounter (INDEPENDENT_AMBULATORY_CARE_PROVIDER_SITE_OTHER): Payer: Medicare Other | Admitting: Ophthalmology

## 2012-12-27 DIAGNOSIS — H26499 Other secondary cataract, unspecified eye: Secondary | ICD-10-CM

## 2013-01-03 ENCOUNTER — Ambulatory Visit (INDEPENDENT_AMBULATORY_CARE_PROVIDER_SITE_OTHER): Payer: Medicare Other | Admitting: Pharmacist Clinician (PhC)/ Clinical Pharmacy Specialist

## 2013-01-03 VITALS — BP 118/62 | HR 80

## 2013-01-03 DIAGNOSIS — I4891 Unspecified atrial fibrillation: Secondary | ICD-10-CM

## 2013-01-03 DIAGNOSIS — Z7901 Long term (current) use of anticoagulants: Secondary | ICD-10-CM

## 2013-01-03 DIAGNOSIS — I4892 Unspecified atrial flutter: Secondary | ICD-10-CM

## 2013-01-17 ENCOUNTER — Encounter (INDEPENDENT_AMBULATORY_CARE_PROVIDER_SITE_OTHER): Payer: Self-pay | Admitting: Ophthalmology

## 2013-01-17 DIAGNOSIS — H43819 Vitreous degeneration, unspecified eye: Secondary | ICD-10-CM

## 2013-01-22 ENCOUNTER — Emergency Department (HOSPITAL_COMMUNITY): Payer: Medicare Other

## 2013-01-22 ENCOUNTER — Encounter (HOSPITAL_COMMUNITY): Payer: Self-pay | Admitting: Emergency Medicine

## 2013-01-22 ENCOUNTER — Inpatient Hospital Stay (HOSPITAL_COMMUNITY)
Admission: EM | Admit: 2013-01-22 | Discharge: 2013-01-25 | DRG: 871 | Disposition: A | Discharge status: Home / Self Care | Payer: Medicare Other | Attending: Internal Medicine | Admitting: Internal Medicine

## 2013-01-22 DIAGNOSIS — Z7901 Long term (current) use of anticoagulants: Secondary | ICD-10-CM

## 2013-01-22 DIAGNOSIS — I5032 Chronic diastolic (congestive) heart failure: Secondary | ICD-10-CM | POA: Diagnosis present

## 2013-01-22 DIAGNOSIS — I4891 Unspecified atrial fibrillation: Secondary | ICD-10-CM

## 2013-01-22 DIAGNOSIS — R0600 Dyspnea, unspecified: Secondary | ICD-10-CM | POA: Diagnosis present

## 2013-01-22 DIAGNOSIS — D649 Anemia, unspecified: Secondary | ICD-10-CM | POA: Diagnosis present

## 2013-01-22 DIAGNOSIS — N179 Acute kidney failure, unspecified: Secondary | ICD-10-CM | POA: Diagnosis present

## 2013-01-22 DIAGNOSIS — I10 Essential (primary) hypertension: Secondary | ICD-10-CM | POA: Diagnosis present

## 2013-01-22 DIAGNOSIS — IMO0001 Reserved for inherently not codable concepts without codable children: Secondary | ICD-10-CM | POA: Diagnosis present

## 2013-01-22 DIAGNOSIS — I2510 Atherosclerotic heart disease of native coronary artery without angina pectoris: Secondary | ICD-10-CM | POA: Diagnosis present

## 2013-01-22 DIAGNOSIS — Z716 Tobacco abuse counseling: Secondary | ICD-10-CM

## 2013-01-22 DIAGNOSIS — F17200 Nicotine dependence, unspecified, uncomplicated: Secondary | ICD-10-CM | POA: Diagnosis present

## 2013-01-22 DIAGNOSIS — J4489 Other specified chronic obstructive pulmonary disease: Secondary | ICD-10-CM | POA: Diagnosis present

## 2013-01-22 DIAGNOSIS — R509 Fever, unspecified: Secondary | ICD-10-CM | POA: Diagnosis present

## 2013-01-22 DIAGNOSIS — M5412 Radiculopathy, cervical region: Secondary | ICD-10-CM | POA: Diagnosis present

## 2013-01-22 DIAGNOSIS — E1165 Type 2 diabetes mellitus with hyperglycemia: Secondary | ICD-10-CM

## 2013-01-22 DIAGNOSIS — I509 Heart failure, unspecified: Secondary | ICD-10-CM | POA: Diagnosis present

## 2013-01-22 DIAGNOSIS — M479 Spondylosis, unspecified: Secondary | ICD-10-CM | POA: Diagnosis present

## 2013-01-22 DIAGNOSIS — E119 Type 2 diabetes mellitus without complications: Secondary | ICD-10-CM | POA: Diagnosis present

## 2013-01-22 DIAGNOSIS — E785 Hyperlipidemia, unspecified: Secondary | ICD-10-CM

## 2013-01-22 DIAGNOSIS — Z8249 Family history of ischemic heart disease and other diseases of the circulatory system: Secondary | ICD-10-CM

## 2013-01-22 DIAGNOSIS — J449 Chronic obstructive pulmonary disease, unspecified: Secondary | ICD-10-CM | POA: Diagnosis present

## 2013-01-22 DIAGNOSIS — Z86718 Personal history of other venous thrombosis and embolism: Secondary | ICD-10-CM

## 2013-01-22 DIAGNOSIS — R05 Cough: Secondary | ICD-10-CM

## 2013-01-22 DIAGNOSIS — R0602 Shortness of breath: Secondary | ICD-10-CM

## 2013-01-22 DIAGNOSIS — I4892 Unspecified atrial flutter: Secondary | ICD-10-CM | POA: Diagnosis present

## 2013-01-22 DIAGNOSIS — E669 Obesity, unspecified: Secondary | ICD-10-CM

## 2013-01-22 DIAGNOSIS — A419 Sepsis, unspecified organism: Secondary | ICD-10-CM | POA: Diagnosis present

## 2013-01-22 DIAGNOSIS — J189 Pneumonia, unspecified organism: Secondary | ICD-10-CM | POA: Diagnosis present

## 2013-01-22 LAB — CBC WITH DIFFERENTIAL/PLATELET
Basophils Absolute: 0 10*3/uL (ref 0.0–0.1)
Eosinophils Absolute: 0.2 10*3/uL (ref 0.0–0.7)
HCT: 34.8 % — ABNORMAL LOW (ref 39.0–52.0)
Lymphs Abs: 1.4 10*3/uL (ref 0.7–4.0)
MCHC: 31 g/dL (ref 30.0–36.0)
MCV: 70.7 fL — ABNORMAL LOW (ref 78.0–100.0)
Monocytes Absolute: 1.2 10*3/uL — ABNORMAL HIGH (ref 0.1–1.0)
Neutro Abs: 14.3 10*3/uL — ABNORMAL HIGH (ref 1.7–7.7)
Platelets: 270 10*3/uL (ref 150–400)
RDW: 17.5 % — ABNORMAL HIGH (ref 11.5–15.5)

## 2013-01-22 LAB — BASIC METABOLIC PANEL
BUN: 15 mg/dL (ref 6–23)
Calcium: 9.1 mg/dL (ref 8.4–10.5)
Creatinine, Ser: 1 mg/dL (ref 0.50–1.35)
GFR calc non Af Amer: 73 mL/min — ABNORMAL LOW (ref >90)
Glucose, Bld: 135 mg/dL — ABNORMAL HIGH (ref 70–99)

## 2013-01-22 LAB — URINALYSIS, ROUTINE W REFLEX MICROSCOPIC
Bilirubin Urine: NEGATIVE
Hgb urine dipstick: NEGATIVE
Ketones, ur: NEGATIVE mg/dL
Protein, ur: NEGATIVE mg/dL
Specific Gravity, Urine: 1.007 (ref 1.005–1.030)
Urobilinogen, UA: 0.2 mg/dL (ref 0.0–1.0)

## 2013-01-22 LAB — URINE MICROSCOPIC-ADD ON

## 2013-01-22 LAB — PROTIME-INR: INR: 2.85 — ABNORMAL HIGH (ref 0.00–1.49)

## 2013-01-22 LAB — POCT I-STAT TROPONIN I: Troponin i, poc: 0 ng/mL (ref 0.00–0.08)

## 2013-01-22 MED ORDER — ALBUTEROL SULFATE (5 MG/ML) 0.5% IN NEBU: 2.5000 mg | INHALATION_SOLUTION | RESPIRATORY_TRACT | Status: DC | PRN

## 2013-01-22 MED ORDER — FLECAINIDE ACETATE 50 MG PO TABS
225.0000 mg | ORAL_TABLET | Freq: Two times a day (BID) | ORAL | Status: DC
  Administered 2013-01-23 – 2013-01-24 (×5): 225 mg via ORAL
  Filled 2013-01-22 (×7): qty 1

## 2013-01-22 MED ORDER — WARFARIN SODIUM 5 MG PO TABS: 5.0000 mg | ORAL_TABLET | ORAL | Status: DC

## 2013-01-22 MED ORDER — DILTIAZEM HCL ER COATED BEADS 240 MG PO CP24
240.0000 mg | ORAL_CAPSULE | ORAL | Status: DC
  Administered 2013-01-23 – 2013-01-25 (×3): 240 mg via ORAL
  Filled 2013-01-22 (×4): qty 1

## 2013-01-22 MED ORDER — DEXTROSE 5 % IV SOLN
1.0000 g | INTRAVENOUS | Status: DC
  Administered 2013-01-23: 1 g via INTRAVENOUS
  Filled 2013-01-22 (×3): qty 10

## 2013-01-22 MED ORDER — WARFARIN SODIUM 7.5 MG PO TABS
7.5000 mg | ORAL_TABLET | ORAL | Status: DC
  Filled 2013-01-22: qty 1

## 2013-01-22 MED ORDER — ALBUTEROL SULFATE (5 MG/ML) 0.5% IN NEBU
5.0000 mg | INHALATION_SOLUTION | Freq: Once | RESPIRATORY_TRACT | Status: AC
  Administered 2013-01-22: 5 mg via RESPIRATORY_TRACT
  Filled 2013-01-22: qty 1

## 2013-01-22 MED ORDER — LORATADINE 10 MG PO TABS
10.0000 mg | ORAL_TABLET | ORAL | Status: DC
  Administered 2013-01-23 – 2013-01-25 (×3): 10 mg via ORAL
  Filled 2013-01-22 (×4): qty 1

## 2013-01-22 MED ORDER — INSULIN ASPART 100 UNIT/ML ~~LOC~~ SOLN
0.0000 [IU] | Freq: Three times a day (TID) | SUBCUTANEOUS | Status: DC
  Administered 2013-01-23 – 2013-01-24 (×4): 1 [IU] via SUBCUTANEOUS

## 2013-01-22 MED ORDER — IPRATROPIUM BROMIDE 0.02 % IN SOLN
0.5000 mg | Freq: Once | RESPIRATORY_TRACT | Status: AC
  Administered 2013-01-22: 0.5 mg via RESPIRATORY_TRACT
  Filled 2013-01-22: qty 2.5

## 2013-01-22 MED ORDER — METOPROLOL SUCCINATE ER 100 MG PO TB24
100.0000 mg | ORAL_TABLET | Freq: Every day | ORAL | Status: DC
  Administered 2013-01-23 – 2013-01-25 (×2): 100 mg via ORAL
  Filled 2013-01-22 (×4): qty 1

## 2013-01-22 MED ORDER — ACETAMINOPHEN 650 MG RE SUPP: 650.0000 mg | Freq: Four times a day (QID) | RECTAL | Status: DC | PRN

## 2013-01-22 MED ORDER — DEXTROSE 5 % IV SOLN
500.0000 mg | INTRAVENOUS | Status: DC
  Administered 2013-01-23: 500 mg via INTRAVENOUS
  Filled 2013-01-22 (×3): qty 500

## 2013-01-22 MED ORDER — IBUPROFEN 800 MG PO TABS
800.0000 mg | ORAL_TABLET | Freq: Once | ORAL | Status: AC
  Administered 2013-01-22: 800 mg via ORAL
  Filled 2013-01-22: qty 1

## 2013-01-22 MED ORDER — SODIUM CHLORIDE 0.9 % IJ SOLN
3.0000 mL | Freq: Two times a day (BID) | INTRAMUSCULAR | Status: DC
  Administered 2013-01-23 – 2013-01-24 (×2): 3 mL via INTRAVENOUS

## 2013-01-22 MED ORDER — WARFARIN - PHARMACIST DOSING INPATIENT: Freq: Every day | Status: DC

## 2013-01-22 MED ORDER — DEXTROSE 5 % IV SOLN
1.0000 g | Freq: Once | INTRAVENOUS | Status: AC
  Administered 2013-01-22: 1 g via INTRAVENOUS
  Filled 2013-01-22: qty 10

## 2013-01-22 MED ORDER — ATORVASTATIN CALCIUM 40 MG PO TABS
40.0000 mg | ORAL_TABLET | Freq: Every day | ORAL | Status: DC
  Administered 2013-01-23 – 2013-01-24 (×2): 40 mg via ORAL
  Filled 2013-01-22 (×3): qty 1

## 2013-01-22 MED ORDER — NITROGLYCERIN 0.4 MG SL SUBL: 0.4000 mg | SUBLINGUAL_TABLET | SUBLINGUAL | Status: DC | PRN

## 2013-01-22 MED ORDER — ONDANSETRON HCL 4 MG PO TABS: 4.0000 mg | ORAL_TABLET | Freq: Four times a day (QID) | ORAL | Status: DC | PRN

## 2013-01-22 MED ORDER — ACETAMINOPHEN 325 MG PO TABS
650.0000 mg | ORAL_TABLET | Freq: Four times a day (QID) | ORAL | Status: DC | PRN
  Administered 2013-01-22 – 2013-01-24 (×3): 650 mg via ORAL
  Filled 2013-01-22 (×3): qty 2

## 2013-01-22 MED ORDER — INSULIN GLARGINE 100 UNIT/ML ~~LOC~~ SOLN
37.0000 [IU] | Freq: Every day | SUBCUTANEOUS | Status: DC
  Administered 2013-01-22 – 2013-01-24 (×3): 37 [IU] via SUBCUTANEOUS
  Filled 2013-01-22 (×4): qty 0.37

## 2013-01-22 MED ORDER — ASPIRIN EC 81 MG PO TBEC
81.0000 mg | DELAYED_RELEASE_TABLET | ORAL | Status: DC
  Administered 2013-01-23 – 2013-01-25 (×3): 81 mg via ORAL
  Filled 2013-01-22 (×4): qty 1

## 2013-01-22 MED ORDER — ONDANSETRON HCL 4 MG/2ML IJ SOLN: 4.0000 mg | Freq: Four times a day (QID) | INTRAMUSCULAR | Status: DC | PRN

## 2013-01-22 MED ORDER — SODIUM CHLORIDE 0.9 % IJ SOLN
3.0000 mL | Freq: Two times a day (BID) | INTRAMUSCULAR | Status: DC
  Administered 2013-01-22 – 2013-01-23 (×2): 3 mL via INTRAVENOUS

## 2013-01-22 MED ORDER — DEXTROSE 5 % IV SOLN
500.0000 mg | Freq: Once | INTRAVENOUS | Status: AC
  Administered 2013-01-22: 500 mg via INTRAVENOUS

## 2013-01-22 NOTE — ED Notes (Signed)
Lactic Acid results called and given to Dr. Criss Alvine.
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
## 2013-01-22 NOTE — ED Notes (Signed)
Presents with fever that began Monday night, this evening became SOB and dry cough. Reports pain in chest with cough. Fever of 101.2 rectal,  Rhonchi on left lung upon auscultation, right lung clear. Denies nausea, reports headaches and dizziness. Alert and oriented
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
## 2013-01-22 NOTE — Progress Notes (Signed)
ANTICOAGULATION CONSULT NOTE - Initial Consult  Pharmacy Consult for Warfarin  Indication: atrial fibrillation  No Known Allergies  Patient Measurements: Height: 5\' 6"  (167.6 cm) Weight: 205 lb 8 oz (93.214 kg) (a scale) IBW/kg (Calculated) : 63.8  Labs:  Recent Labs  01/22/13 1914  HGB 10.8*  HCT 34.8*  PLT 270  LABPROT 28.9*  INR 2.85*  CREATININE 1.00    Estimated Creatinine Clearance: 70.4 ml/min (by C-G formula based on Cr of 1).   Medical History: Past Medical History  Diagnosis Date  . Atrial flutter 08/05/2010    Status post caval tricuspid isthmus ablation by Dr. Johney Frame  . PAF (paroxysmal atrial fibrillation) 2012    Recurrent after atrial flutter, currently controlled on flecainide plus diltiazem  . Diabetes mellitus type II     On metformin and insulin  . Hyperlipidemia   . HTN (hypertension)   . Diastolic CHF, chronic     preserved EF 55%; exacerbated by A. fib/A. flutter  . Coronary artery disease, non-occlusive January 2012    Nonocclusive disease by cath, performed for ST elevations on ECG.  . History of DVT (deep vein thrombosis)     on warfarin  . Tobacco abuse     Initially quit, now back to smoking.  [name] COPD (chronic obstructive pulmonary disease)   . Cholecystitis, acute January 2012    Status post cholecystectomy preceded by T-tube,   . Cervical radiculopathy due to degenerative joint disease of spine   . Claudication     lower ext dopplers 08/18/11-normal ABIs bilaterally with normal triphasic waveforms    Medications:  Warfarin PTA Dose:  7.5mg  Tues/Thurs/Sun 5mg  Mon/Wed/Fri/Sat  Assessment: 72 y/o M on chronic warfarin for afib here with fever. INR 2.85 on admit, Hgb 10.8, Plts 270, CrCl ~70, no overt bleeding noted.   Goal of Therapy:  INR 2-3 Monitor platelets by anticoagulation protocol: Yes   Plan:  -Continue warfarin per home regimen above -Daily PT/INR -Adjust warfarin as needed -Monitor for bleeding  Thank you for  allowing me to take part in this patient's care,  Abran Duke, PharmD Clinical Pharmacist Phone: [phone] Pager: [phone] 01/22/2013 11:00 PM

## 2013-01-23 DIAGNOSIS — E1165 Type 2 diabetes mellitus with hyperglycemia: Secondary | ICD-10-CM

## 2013-01-23 DIAGNOSIS — A419 Sepsis, unspecified organism: Secondary | ICD-10-CM | POA: Diagnosis present

## 2013-01-23 DIAGNOSIS — J189 Pneumonia, unspecified organism: Secondary | ICD-10-CM | POA: Diagnosis present

## 2013-01-23 DIAGNOSIS — N179 Acute kidney failure, unspecified: Secondary | ICD-10-CM | POA: Diagnosis present

## 2013-01-23 LAB — CBC
HCT: 29.9 % — ABNORMAL LOW (ref 39.0–52.0)
Hemoglobin: 9.6 g/dL — ABNORMAL LOW (ref 13.0–17.0)
MCHC: 32.1 g/dL (ref 30.0–36.0)
MCV: 70 fL — ABNORMAL LOW (ref 78.0–100.0)
Platelets: 232 10*3/uL (ref 150–400)
RBC: 4.27 MIL/uL (ref 4.22–5.81)
WBC: 14.3 10*3/uL — ABNORMAL HIGH (ref 4.0–10.5)

## 2013-01-23 LAB — INFLUENZA PANEL BY PCR (TYPE A & B)
H1N1 flu by pcr: NOT DETECTED
Influenza A By PCR: NEGATIVE

## 2013-01-23 LAB — GLUCOSE, CAPILLARY
Glucose-Capillary: 177 mg/dL — ABNORMAL HIGH (ref 70–99)
Glucose-Capillary: 106 mg/dL — ABNORMAL HIGH (ref 70–99)
Glucose-Capillary: 148 mg/dL — ABNORMAL HIGH (ref 70–99)
Glucose-Capillary: 107 mg/dL — ABNORMAL HIGH (ref 70–99)

## 2013-01-23 LAB — BASIC METABOLIC PANEL
CO2: 25 mEq/L (ref 19–32)
Chloride: 101 mEq/L (ref 96–112)
Creatinine, Ser: 1.36 mg/dL — ABNORMAL HIGH (ref 0.50–1.35)
Sodium: 138 mEq/L (ref 135–145)

## 2013-01-23 LAB — TROPONIN I: Troponin I: <0.3 ng/mL (ref <0.30)

## 2013-01-23 MED ORDER — SODIUM CHLORIDE 0.9 % IV SOLN
INTRAVENOUS | Status: AC
  Administered 2013-01-23: 21:00:00 via INTRAVENOUS
  Administered 2013-01-23: 100 mL/h via INTRAVENOUS
  Administered 2013-01-24: 07:00:00 via INTRAVENOUS

## 2013-01-23 MED ORDER — WARFARIN SODIUM 2.5 MG PO TABS
2.5000 mg | ORAL_TABLET | Freq: Once | ORAL | Status: AC
  Administered 2013-01-23: 2.5 mg via ORAL
  Filled 2013-01-23 (×2): qty 1

## 2013-01-23 MED ORDER — WARFARIN SODIUM 5 MG PO TABS
5.0000 mg | ORAL_TABLET | Freq: Once | ORAL | Status: DC
  Filled 2013-01-23: qty 1

## 2013-01-23 MED ORDER — SODIUM CHLORIDE 0.9 % IV BOLUS (SEPSIS)
1000.0000 mL | Freq: Once | INTRAVENOUS | Status: AC
  Administered 2013-01-23: 1000 mL via INTRAVENOUS

## 2013-01-23 MED ORDER — SODIUM CHLORIDE 0.9 % IV SOLN: INTRAVENOUS | Status: DC

## 2013-01-23 MED ORDER — DOCUSATE SODIUM 100 MG PO CAPS
100.0000 mg | ORAL_CAPSULE | Freq: Two times a day (BID) | ORAL | Status: AC
  Administered 2013-01-24 (×2): 100 mg via ORAL
  Filled 2013-01-23 (×5): qty 1

## 2013-01-23 NOTE — Progress Notes (Signed)
[facility] PROGRESS NOTE Interim History: 72 y.o. male history of atrial fibrillation on Coumadin, diabetes mellitus type 2, diastolic CHF last EF was 55%, history of DVT, COPD presented to the ER because of fever and shortness of breath over the last 2 days. Patient states that shortness of breath was present even at rest denies any exertional symptoms. Along with shortness of breath patient also has been having productive cough and pleuritic-type of chest pain. Denies any nausea vomiting abdominal pain dysuria or diarrhea. In the ER patient was found to be febrile with leukocytosis and mild elevated lactic acid levels   Assessment/Plan: Septic shock due to PNA: - Lactic acid elevated. Bolus NS, then cont at 150 cc monitor strict I and O's. - On Rocephin and azithromycin 10.29.2014. - blood cultures pending. - repeat CXR after hydration.  AKI (acute kidney injury) - NS bolus, most likely due to hypotension. - b-met in am.    Chronic diastolic HEart failure: - compensated. - cont meds.  Type II or unspecified type diabetes mellitus without mention of complication, uncontrolled: - cont lantus plus SSI.  Atrial flutter - status post CTI ablation - diltiazem, rate control. - INR mild elevated.  CAD (coronary artery disease) - cont meds.    Code Status: full Family Communication: none  Disposition Plan: inpatient   Consultants:  none  Procedures:  CXR  Antibiotics:  Rocephin and azithro 10.29.2014  HPI/Subjective: No complains.  Objective: Filed Vitals:   01/22/13 2130 01/22/13 2225 01/23/13 0056 01/23/13 0626  BP: 108/58 95/52 92/52  96/58  Pulse: 72 70 61 63  Temp:  98.2 F (36.8 C) 98.2 F (36.8 C) 97.1 F (36.2 C)  TempSrc:  Oral Oral Oral  Resp:  21 20 20   Height:  5\' 6"  (1.676 m)    Weight:  93.214 kg (205 lb 8 oz)  93.123 kg (205 lb 4.8 oz)  SpO2: 92% 94% 95% 99%    Intake/Output Summary (Last 24 hours) at 01/23/13 0813 Last data filed at  01/22/13 2254  Gross per 24 hour  Intake    222 ml  Output      0 ml  Net    222 ml   Filed Weights   01/22/13 2225 01/23/13 0626  Weight: 93.214 kg (205 lb 8 oz) 93.123 kg (205 lb 4.8 oz)    Exam:  General: Alert, awake, oriented x3, in no acute distress.  HEENT: No bruits, no goiter.  Heart: Regular rate and rhythm, without murmurs, rubs, gallops.  Lungs: Good air movement, clear to auscultation.  Abdomen: Soft, nontender, nondistended, positive bowel sounds.  Neuro: Grossly intact, nonfocal.   Data Reviewed: Basic Metabolic Panel:  Recent Labs Lab 01/22/13 1914 01/23/13 0413  NA 138 138  K 4.1 3.8  CL 100 101  CO2 24 25  GLUCOSE 135* 134*  BUN 15 20  CREATININE 1.00 1.36*  CALCIUM 9.1 8.6   Liver Function Tests: No results found for this basename: AST, ALT, ALKPHOS, BILITOT, PROT, ALBUMIN,  in the last 168 hours No results found for this basename: LIPASE, AMYLASE,  in the last 168 hours No results found for this basename: AMMONIA,  in the last 168 hours CBC:  Recent Labs Lab 01/22/13 1914 01/23/13 0413  WBC 17.1* 14.3*  NEUTROABS 14.3*  --   HGB 10.8* 9.6*  HCT 34.8* 29.9*  MCV 70.7* 70.0*  PLT 270 232   Cardiac Enzymes:  Recent Labs Lab 01/22/13 2350  TROPONINI <0.30   BNP (last  3 results)  Recent Labs  01/22/13 1916  PROBNP 265.1*   CBG:  Recent Labs Lab 01/22/13 2241  GLUCAP 177*    No results found for this or any previous visit (from the past 240 hour(s)).   Studies: Dg Chest 2 View  01/22/2013   CLINICAL DATA:  Cough. Fever. Flu like symptoms for 2 days.  EXAM: CHEST  2 VIEW  COMPARISON:  07/02/2012.  FINDINGS: Nonspecific diffuse interstitial prominence is present. The cardiopericardial silhouette is upper limits of normal for projection. Surgical clips are present overlying the gastroesophageal junction. Basilar atelectasis. No airspace disease. There is no pleural effusion.  IMPRESSION: Chronic changes of the chest with  lower lung volumes and basilar atelectasis.   Electronically Signed   By: Andreas Newport M.D.   On: 01/22/2013 20:14    Scheduled Meds: . aspirin EC  81 mg Oral BH-q7a  . atorvastatin  40 mg Oral Daily  . azithromycin  500 mg Intravenous Q24H  . cefTRIAXone (ROCEPHIN)  IV  1 g Intravenous Q24H  . diltiazem  240 mg Oral BH-q7a  . flecainide  225 mg Oral BID  . insulin aspart  0-9 Units Subcutaneous TID WC  . insulin glargine  37 Units Subcutaneous QHS  . loratadine  10 mg Oral BH-q7a  . metoprolol succinate  100 mg Oral QHS  . sodium chloride  1,000 mL Intravenous Once  . sodium chloride  3 mL Intravenous Q12H  . sodium chloride  3 mL Intravenous Q12H  . [START ON 01/24/2013] warfarin  5 mg Oral Q M,W,F,Sa-1800  . warfarin  7.5 mg Oral Custom  . Warfarin - Pharmacist Dosing Inpatient   Does not apply q1800   Continuous Infusions: . sodium chloride       Marinda Elk  [facility] Pager [phone]. If 8PM-8AM, please contact night-coverage at [email or web address], password [facility] 01/23/2013, 8:13 AM  LOS: 1 day

## 2013-01-23 NOTE — Progress Notes (Signed)
ANTICOAGULATION CONSULT NOTE - Follow up  Pharmacy Consult for Warfarin  Indication: atrial fibrillation  No Known Allergies  Patient Measurements: Height: 5\' 6"  (167.6 cm) Weight: 205 lb 4.8 oz (93.123 kg) (a scale) IBW/kg (Calculated) : 63.8  Labs:  Recent Labs  01/22/13 1914 01/22/13 2350 01/23/13 0413  HGB 10.8*  --  9.6*  HCT 34.8*  --  29.9*  PLT 270  --  232  LABPROT 28.9*  --  32.1*  INR 2.85*  --  3.27*  CREATININE 1.00  --  1.36*  TROPONINI  --  <0.30  --     Estimated Creatinine Clearance: 51.7 ml/min (by C-G formula based on Cr of 1.36).   Medical History: Past Medical History  Diagnosis Date  . Atrial flutter 08/05/2010    Status post caval tricuspid isthmus ablation by Dr. Johney Frame  . PAF (paroxysmal atrial fibrillation) 2012    Recurrent after atrial flutter, currently controlled on flecainide plus diltiazem  . Diabetes mellitus type II     On metformin and insulin  . Hyperlipidemia   . HTN (hypertension)   . Diastolic CHF, chronic     preserved EF 55%; exacerbated by A. fib/A. flutter  . Coronary artery disease, non-occlusive January 2012    Nonocclusive disease by cath, performed for ST elevations on ECG.  . History of DVT (deep vein thrombosis)     on warfarin  . Tobacco abuse     Initially quit, now back to smoking.  [name] COPD (chronic obstructive pulmonary disease)   . Cholecystitis, acute January 2012    Status post cholecystectomy preceded by T-tube,   . Cervical radiculopathy due to degenerative joint disease of spine   . Claudication     lower ext dopplers 08/18/11-normal ABIs bilaterally with normal triphasic waveforms    Medications:  Warfarin PTA Dose:  7.5mg  Tues/Thurs/Sun 5mg  Mon/Wed/Fri/Sat  Assessment: 72 y/o M on chronic warfarin for afib and here with fever. Also has h/o DVT. The INR is 3.27 this AM. Up from 2.85 last night on admit. Coumadin taken PTA yesterday and no coumadin given in hospital last night.  Hgb 9.6 slight  decrease and  PLTC stable at 232. SCr increased to 1.35,  CrCl ~51. No overt bleeding noted.   Azithromycin started 10/29 which is likely to increase coumadin effect.  Will reduce coumadin dose today as INR has increased slightly above goal.    Goal of Therapy:  INR 2-3 Monitor platelets by anticoagulation protocol: Yes   Plan:  -Coumadin 2.5 mg today.  -Daily PT/INR -Adjust warfarin as needed -Monitor for bleeding  Thank you for allowing me to take part in this patient's care,  Noah Delaine, RPh Clinical Pharmacist Pager: [phone]  01/23/2013 10:26 AM

## 2013-01-23 NOTE — Progress Notes (Addendum)
Physical Therapy Evaluation and D/C Patient Details Name: [name] MRN: [id number] DOB: 10/25/1940 Today's Date: 01/23/2013 Time: 4098-1191 PT Time Calculation (min): 20 min  PT Assessment / Plan / Recommendation History of Present Illness  Pt admit for sepsis, PNA and afib.   Clinical Impression  Pt admitted with above. Pt currently without functional limitations and ambulates with independence without LOB. Pt does not need skilled PT at this time.  Will sign off.       PT Assessment  Patent does not need any further PT services    Follow Up Recommendations  No PT follow up                Equipment Recommendations  None recommended by PT               Precautions / Restrictions Precautions Precautions: None Restrictions Weight Bearing Restrictions: No   Pertinent Vitals/Pain VSS, did not desat - O2 on RA with activity was 93% and >, No pain      Mobility  Bed Mobility Bed Mobility: Rolling Left;Left Sidelying to Sit;Sitting - Scoot to Edge of Bed Rolling Left: 7: Independent Left Sidelying to Sit: 7: Independent Sitting - Scoot to Edge of Bed: 7: Independent Transfers Transfers: Sit to Stand;Stand to Sit Sit to Stand: 7: Independent Stand to Sit: 7: Independent Ambulation/Gait Ambulation/Gait Assistance: 7: Independent Ambulation Distance (Feet): 245 Feet Assistive device: None Ambulation/Gait Assistance Details: No LOB with activity.  Sats on RA 93% and >.  Pt states he is at baseline with ambulation.  DOE 1/4.  Able to withstand moderate challenge to balance.  Gait Pattern: Within Functional Limits Stairs: No Wheelchair Mobility Wheelchair Mobility: No      PT Goals(Current goals can be found in the care plan section)    Visit Information  Last PT Received On: 01/23/13 Assistance Needed: +1 History of Present Illness: Pt admit for sepsis, PNA and afib.        Prior Functioning  Home Living Family/patient expects to be discharged to::  Private residence Living Arrangements: Children (oldest daughter and son in law) Available Help at Discharge: Family;Available PRN/intermittently (daughter and son in law works nights) Type of Home: House Home Access: Stairs to enter Secretary/administrator of Steps: 2 Entrance Stairs-Rails: Can reach both Home Layout: One level Home Equipment: None Prior Function Level of Independence: Independent Communication Communication: No difficulties    Cognition  Cognition Arousal/Alertness: Awake/alert Behavior During Therapy: WFL for tasks assessed/performed Overall Cognitive Status: Within Functional Limits for tasks assessed    Extremity/Trunk Assessment Upper Extremity Assessment Upper Extremity Assessment: Defer to OT evaluation Lower Extremity Assessment Lower Extremity Assessment: Overall WFL for tasks assessed Cervical / Trunk Assessment Cervical / Trunk Assessment: Normal   Balance High Level Balance High Level Balance Activites: Sudden stops;Turns;Direction changes;Head turns High Level Balance Comments: All of above without difficulty with independent.  End of Session PT - End of Session Equipment Utilized During Treatment: Gait belt;Oxygen Activity Tolerance: Patient tolerated treatment well Patient left: in bed;with call bell/phone within reach Nurse Communication: Mobility status       INGOLD,Neshia Mckenzie 01/23/2013, 3:57 PM [facility] [phone] (pager)

## 2013-01-23 NOTE — Progress Notes (Signed)
Pt says he feels much better than yesterday. Ambulated in hal earlier in shift by PT and tolerated well. Up ad lib in room. No c/o pain. 02 sats mid to high 90's  on 2 l n/c

## 2013-01-24 ENCOUNTER — Inpatient Hospital Stay (HOSPITAL_COMMUNITY): Payer: Medicare Other

## 2013-01-24 DIAGNOSIS — I517 Cardiomegaly: Secondary | ICD-10-CM

## 2013-01-24 DIAGNOSIS — I509 Heart failure, unspecified: Secondary | ICD-10-CM

## 2013-01-24 DIAGNOSIS — I10 Essential (primary) hypertension: Secondary | ICD-10-CM

## 2013-01-24 DIAGNOSIS — I5032 Chronic diastolic (congestive) heart failure: Secondary | ICD-10-CM

## 2013-01-24 LAB — GLUCOSE, CAPILLARY
Glucose-Capillary: 123 mg/dL — ABNORMAL HIGH (ref 70–99)
Glucose-Capillary: 136 mg/dL — ABNORMAL HIGH (ref 70–99)
Glucose-Capillary: 153 mg/dL — ABNORMAL HIGH (ref 70–99)

## 2013-01-24 LAB — URINE CULTURE: Culture: NO GROWTH

## 2013-01-24 LAB — BASIC METABOLIC PANEL
BUN: 14 mg/dL (ref 6–23)
CO2: 24 mEq/L (ref 19–32)
Calcium: 8.6 mg/dL (ref 8.4–10.5)
GFR calc Af Amer: 77 mL/min — ABNORMAL LOW (ref >90)
GFR calc non Af Amer: 66 mL/min — ABNORMAL LOW (ref >90)
Sodium: 141 mEq/L (ref 135–145)

## 2013-01-24 LAB — PROTIME-INR
INR: 2.63 — ABNORMAL HIGH (ref 0.00–1.49)
Prothrombin Time: 27.2 seconds — ABNORMAL HIGH (ref 11.6–15.2)

## 2013-01-24 MED ORDER — POTASSIUM CHLORIDE CRYS ER 20 MEQ PO TBCR
20.0000 meq | EXTENDED_RELEASE_TABLET | Freq: Once | ORAL | Status: AC
  Administered 2013-01-24: 20 meq via ORAL
  Filled 2013-01-24: qty 1

## 2013-01-24 MED ORDER — WARFARIN SODIUM 5 MG PO TABS
5.0000 mg | ORAL_TABLET | Freq: Once | ORAL | Status: AC
  Administered 2013-01-24: 5 mg via ORAL
  Filled 2013-01-24: qty 1

## 2013-01-24 MED ORDER — FUROSEMIDE 10 MG/ML IJ SOLN
20.0000 mg | Freq: Once | INTRAMUSCULAR | Status: AC
  Administered 2013-01-24: 20 mg via INTRAVENOUS

## 2013-01-24 MED ORDER — LEVOFLOXACIN 750 MG PO TABS
750.0000 mg | ORAL_TABLET | Freq: Every day | ORAL | Status: DC
  Administered 2013-01-24: 750 mg via ORAL
  Filled 2013-01-24 (×4): qty 1

## 2013-01-24 NOTE — Progress Notes (Signed)
ANTICOAGULATION CONSULT NOTE - Follow up  Pharmacy Consult for Warfarin  Indication: atrial fibrillation  No Known Allergies  Patient Measurements: Height: 5\' 6"  (167.6 cm) Weight: 206 lb 6.4 oz (93.622 kg) (a scale) IBW/kg (Calculated) : 63.8  Labs:  Recent Labs  01/22/13 1914 01/22/13 2350 01/23/13 0413 01/24/13 0430  HGB 10.8*  --  9.6*  --   HCT 34.8*  --  29.9*  --   PLT 270  --  232  --   LABPROT 28.9*  --  32.1* 27.2*  INR 2.85*  --  3.27* 2.63*  CREATININE 1.00  --  1.36* 1.08  TROPONINI  --  <0.30  --   --     Estimated Creatinine Clearance: 65.2 ml/min (by C-G formula based on Cr of 1.08).   Medical History: Past Medical History  Diagnosis Date  . Atrial flutter 08/05/2010    Status post caval tricuspid isthmus ablation by Dr. Johney Frame  . PAF (paroxysmal atrial fibrillation) 2012    Recurrent after atrial flutter, currently controlled on flecainide plus diltiazem  . Diabetes mellitus type II     On metformin and insulin  . Hyperlipidemia   . HTN (hypertension)   . Diastolic CHF, chronic     preserved EF 55%; exacerbated by A. fib/A. flutter  . Coronary artery disease, non-occlusive January 2012    Nonocclusive disease by cath, performed for ST elevations on ECG.  . History of DVT (deep vein thrombosis)     on warfarin  . Tobacco abuse     Initially quit, now back to smoking.  [name] COPD (chronic obstructive pulmonary disease)   . Cholecystitis, acute January 2012    Status post cholecystectomy preceded by T-tube,   . Cervical radiculopathy due to degenerative joint disease of spine   . Claudication     lower ext dopplers 08/18/11-normal ABIs bilaterally with normal triphasic waveforms    Medications:  Warfarin PTA Dose:  7.5mg  Tues/Thurs/Sun 5mg  Mon/Wed/Fri/Sat  Assessment: 72 y/o M on chronic warfarin for afib and here with fever. Also has h/o DVT. The INR is 2.63 today, back in therapeutic range after being above goal at 3.27  Yesterday.  On  01/23/13 Hgb 9.6, a slight decrease and PLTC stable at 232. SCr improved to 1.08.  CrCl ~65. No overt bleeding noted.   Azithromycin & ceftriaxone discontinued today. Changed to levofloxacin which may increase coumadin effect.   Goal of Therapy:  INR 2-3 Monitor platelets by anticoagulation protocol: Yes   Plan:  -Coumadin 5 mg today.  -Daily PT/INR -Monitor for bleeding  Thank you for allowing me to take part in this patient's care,  Noah Delaine, RPh Clinical Pharmacist Pager: [phone] 01/24/2013 10:34 AM

## 2013-01-24 NOTE — ED Provider Notes (Signed)
Medical screening examination/treatment/procedure(s) were conducted as a shared visit with non-physician practitioner(s) and myself.  I personally evaluated the patient during the encounter.  EKG Interpretation     Ventricular Rate:  82 PR Interval:  203 QRS Duration: 103 QT Interval:  396 QTC Calculation: 462 R Axis:   -42 Text Interpretation:  Sinus or ectopic atrial rhythm Left axis deviation Abnormal R-wave progression, early transition ED PHYSICIAN INTERPRETATION AVAILABLE IN [facility]             Patient with fever and respiratory sx. No PNA but CXR could lag behind. Will test for flu and treat for PNA. Admit to hospitalist.  Audree Camel, MD 01/24/13 [phone]

## 2013-01-24 NOTE — Progress Notes (Signed)
Pt.is A/Ox4 and is standby assist due to IV pump. He had c/o non-radiating left arm and shoulder pain so prn tylenol was given and was effective. Pt.had no further c/o pain. He went for chest x-ray this am.

## 2013-01-24 NOTE — Progress Notes (Signed)
  Echocardiogram 2D Echocardiogram has been performed.  [name] 01/24/2013, 11:30 AM

## 2013-01-24 NOTE — Plan of Care (Signed)
Problem: Phase I Progression Outcomes Goal: Confirm chest x-ray completed Outcome: Completed/Met Date Met:  01/24/13 Done on 01/22/2013

## 2013-01-24 NOTE — Progress Notes (Signed)
[facility] PROGRESS NOTE Interim History: 72 y.o. male history of atrial fibrillation on Coumadin, diabetes mellitus type 2, diastolic CHF last EF was 55%, history of DVT, COPD presented to the ER because of fever and shortness of breath over the last 2 days. Patient states that shortness of breath was present even at rest denies any exertional symptoms. Along with shortness of breath patient also has been having productive cough and pleuritic-type of chest pain. Denies any nausea vomiting abdominal pain dysuria or diarrhea. In the ER patient was found to be febrile with leukocytosis and mild elevated lactic acid levels   Assessment/Plan: Septic shock due to PNA: - On Rocephin and azithromycin 10.29.2014. Change to levaquin - blood cultures pending. - repeated CXR left lung base.  AKI (acute kidney injury) - Resolved. most likely due to hypotension. - b-met in am.    Chronic diastolic HEart failure: - compensated. Mild congestion on physical exam, low single dose of lasix. b-met in am. - cont metoprolol.  Type II or unspecified type diabetes mellitus without mention of complication, uncontrolled: - cont lantus plus SSI. - check Hbg A1c  Atrial flutter - status post CTI ablation - diltiazem and flecanide, rate control. - INR mild elevated.  CAD (coronary artery disease) - cont meds.    Code Status: full Family Communication: none  Disposition Plan: inpatient   Consultants:  none  Procedures:  CXR  Antibiotics:  Rocephin and azithro 10.29.2014  HPI/Subjective: No complains.  Objective: Filed Vitals:   01/23/13 1351 01/23/13 2034 01/24/13 0454 01/24/13 0900  BP: 90/53 115/60 96/58 101/52  Pulse: 62 68 59 64  Temp: 97.8 F (36.6 C) 97.6 F (36.4 C) 98.1 F (36.7 C) 97.8 F (36.6 C)  TempSrc: Oral Oral Oral Oral  Resp: 20 20 18 20   Height:      Weight:   93.622 kg (206 lb 6.4 oz)   SpO2: 94% 96% 96% 94%    Intake/Output Summary (Last 24 hours)  at 01/24/13 0924 Last data filed at 01/24/13 1610  Gross per 24 hour  Intake 3765.08 ml  Output    900 ml  Net 2865.08 ml   Filed Weights   01/22/13 2225 01/23/13 0626 01/24/13 0454  Weight: 93.214 kg (205 lb 8 oz) 93.123 kg (205 lb 4.8 oz) 93.622 kg (206 lb 6.4 oz)    Exam:  General: Alert, awake, oriented x3, in no acute distress.  HEENT: No bruits, no goiter.  Heart: Regular rate and rhythm, without murmurs, rubs, gallops.  Lungs: Good air movement, crackles at bases. Abdomen: Soft, nontender, nondistended, positive bowel sounds.  Neuro: Grossly intact, nonfocal.   Data Reviewed: Basic Metabolic Panel:  Recent Labs Lab 01/22/13 1914 01/23/13 0413 01/24/13 0430  NA 138 138 141  K 4.1 3.8 4.1  CL 100 101 107  CO2 24 25 24   GLUCOSE 135* 134* 94  BUN 15 20 14   CREATININE 1.00 1.36* 1.08  CALCIUM 9.1 8.6 8.6   Liver Function Tests: No results found for this basename: AST, ALT, ALKPHOS, BILITOT, PROT, ALBUMIN,  in the last 168 hours No results found for this basename: LIPASE, AMYLASE,  in the last 168 hours No results found for this basename: AMMONIA,  in the last 168 hours CBC:  Recent Labs Lab 01/22/13 1914 01/23/13 0413  WBC 17.1* 14.3*  NEUTROABS 14.3*  --   HGB 10.8* 9.6*  HCT 34.8* 29.9*  MCV 70.7* 70.0*  PLT 270 232   Cardiac Enzymes:  Recent Labs  Lab 01/22/13 2350  TROPONINI <0.30   BNP (last 3 results)  Recent Labs  01/22/13 1916  PROBNP 265.1*   CBG:  Recent Labs Lab 01/23/13 0728 01/23/13 1103 01/23/13 1614 01/23/13 2107 01/24/13 0649  GLUCAP 106* 129* 148* 107* 103*    Recent Results (from the past 240 hour(s))  CULTURE, BLOOD (ROUTINE X 2)     Status: None   Collection Time    01/22/13  9:46 PM      Result Value Range Status   Specimen Description BLOOD ARM RIGHT   Final   Special Requests BOTTLES DRAWN AEROBIC AND ANAEROBIC 10CC   Final   Culture  Setup Time     Final   Value: 01/23/2013 03:14     Performed at  [facility]   Culture     Final   Value:        BLOOD CULTURE RECEIVED NO GROWTH TO DATE CULTURE WILL BE HELD FOR 5 DAYS BEFORE ISSUING A FINAL NEGATIVE REPORT     Performed at [facility]   Report Status PENDING   Incomplete  CULTURE, BLOOD (ROUTINE X 2)     Status: None   Collection Time    01/22/13  9:55 PM      Result Value Range Status   Specimen Description BLOOD FOREARM RIGHT   Final   Special Requests BOTTLES DRAWN AEROBIC ONLY 10CC   Final   Culture  Setup Time     Final   Value: 01/23/2013 03:13     Performed at [facility]   Culture     Final   Value:        BLOOD CULTURE RECEIVED NO GROWTH TO DATE CULTURE WILL BE HELD FOR 5 DAYS BEFORE ISSUING A FINAL NEGATIVE REPORT     Performed at [facility]   Report Status PENDING   Incomplete     Studies: Dg Chest 2 View  01/24/2013   CLINICAL DATA:  Cough, short of breath  EXAM: CHEST  2 VIEW  COMPARISON:  Prior chest x-ray 01/22/2013  FINDINGS: Apparent enlargement of the mediastinum in cardiopericardial silhouette on the present study likely reflects differences in radiographic technique (AP versus PA positioning). Slightly increased pulmonary vascular congestion. Developing left retrocardiac opacity. Surgical clips noted in the region of the GE junction. No support apparatus identified.  IMPRESSION: Developing patchy opacity in the left lung base may reflect atelectasis or infiltrate.  Increased pulmonary vascular congestion without overt edema.   Electronically Signed   By: Malachy Moan M.D.   On: 01/24/2013 07:43   Dg Chest 2 View  01/22/2013   CLINICAL DATA:  Cough. Fever. Flu like symptoms for 2 days.  EXAM: CHEST  2 VIEW  COMPARISON:  07/02/2012.  FINDINGS: Nonspecific diffuse interstitial prominence is present. The cardiopericardial silhouette is upper limits of normal for projection. Surgical clips are present overlying the gastroesophageal junction. Basilar atelectasis. No airspace  disease. There is no pleural effusion.  IMPRESSION: Chronic changes of the chest with lower lung volumes and basilar atelectasis.   Electronically Signed   By: Andreas Newport M.D.   On: 01/22/2013 20:14    Scheduled Meds: . aspirin EC  81 mg Oral BH-q7a  . atorvastatin  40 mg Oral Daily  . azithromycin  500 mg Intravenous Q24H  . cefTRIAXone (ROCEPHIN)  IV  1 g Intravenous Q24H  . diltiazem  240 mg Oral BH-q7a  . docusate sodium  100 mg Oral BID  .  flecainide  225 mg Oral BID  . insulin aspart  0-9 Units Subcutaneous TID WC  . insulin glargine  37 Units Subcutaneous QHS  . loratadine  10 mg Oral BH-q7a  . metoprolol succinate  100 mg Oral QHS  . sodium chloride  3 mL Intravenous Q12H  . sodium chloride  3 mL Intravenous Q12H  . Warfarin - Pharmacist Dosing Inpatient   Does not apply q1800   Continuous Infusions:     Marinda Elk  [facility] Pager [phone]. If 8PM-8AM, please contact night-coverage at [email or web address], password [facility] 01/24/2013, 9:24 AM  LOS: 2 days

## 2013-01-25 LAB — BASIC METABOLIC PANEL
BUN: 14 mg/dL (ref 6–23)
Calcium: 8.9 mg/dL (ref 8.4–10.5)
Creatinine, Ser: 1.13 mg/dL (ref 0.50–1.35)
GFR calc Af Amer: 73 mL/min — ABNORMAL LOW (ref >90)
GFR calc non Af Amer: 63 mL/min — ABNORMAL LOW (ref >90)
Glucose, Bld: 114 mg/dL — ABNORMAL HIGH (ref 70–99)
Potassium: 4 mEq/L (ref 3.5–5.1)

## 2013-01-25 LAB — PROTIME-INR: Prothrombin Time: 19.9 seconds — ABNORMAL HIGH (ref 11.6–15.2)

## 2013-01-25 LAB — GLUCOSE, CAPILLARY: Glucose-Capillary: 85 mg/dL (ref 70–99)

## 2013-01-25 MED ORDER — LEVOFLOXACIN 750 MG PO TABS: 750.0000 mg | ORAL_TABLET | Freq: Every day | ORAL | Status: DC

## 2013-01-25 NOTE — Discharge Summary (Signed)
Physician Discharge Summary  [name] WUJ:[id number] DOB: 08/20/1940 DOA: 01/22/2013  PCP: [name], MD  Admit date: 01/22/2013 Discharge date: 01/25/2013  Time spent: 35 minutes  Recommendations for Outpatient Follow-up:  1. Follow up with PCP in 2 weeks.  Discharge Diagnoses:  Principal Problem:   Septic shock Active Problems:   AKI (acute kidney injury)   Atrial flutter - status post CTI ablation   CAD (coronary artery disease)   Fever   Shortness of breath   Type II or unspecified type diabetes mellitus without mention of complication, uncontrolled   CAP (community acquired pneumonia)   Discharge Condition: stable  Diet recommendation: heart healthy  Filed Weights   01/23/13 0626 01/24/13 0454 01/25/13 0509  Weight: 93.123 kg (205 lb 4.8 oz) 93.622 kg (206 lb 6.4 oz) 93.4 kg (205 lb 14.6 oz)    History of present illness:  72 y.o. male history of atrial fibrillation on Coumadin, diabetes mellitus type 2, diastolic CHF last EF was 55%, history of DVT, COPD presented to the ER because of fever and shortness of breath over the last 2 days. Patient states that shortness of breath was present even at rest denies any exertional symptoms. Along with shortness of breath patient also has been having productive cough and pleuritic-type of chest pain. Denies any nausea vomiting abdominal pain dysuria or diarrhea. In the ER patient was found to be febrile with leukocytosis and mild elevated lactic acid levels. Chest x-ray did not show any definite infiltrates. Patient has been started on ceftriaxone and Zithromax for presumptive diagnosis of pneumonia. Blood cultures has been sent and patient will be admitted for further management of possible pneumonia.   Hospital Course:  Septic shock due to PNA:  - On Rocephin and azithromycin 10.29.2014. Change to levaquin  - blood cultures negative till date.  - repeated CXR left lung base.   AKI (acute kidney injury)  - Resolved.  most likely due to hypotension.    Chronic diastolic HEart failure:  - compensated. Mild congestion on physical exam, low single dose of lasix. b-met in am.  - cont metoprolol.   Type II or unspecified type diabetes mellitus without mention of complication, uncontrolled:  - cont lantus plus SSI.  - check Hbg A1c   Atrial flutter - status post CTI ablation  - diltiazem and flecanide, rate control.  - INR mild elevated.   CAD (coronary artery disease)  - cont meds.   Procedures:  CXR  Consultations:  none  Discharge Exam: Filed Vitals:   01/25/13 0900  BP: 118/55  Pulse: 63  Temp: 97.8 F (36.6 C)  Resp: 20    General: A&O x3 Cardiovascular: RRR Respiratory: good air movement CTA B/L  Discharge Instructions      Discharge Orders   Future Appointments Provider Department Dept Phone   02/07/2013 10:00 AM Phillips Hay, RPH-CPP [facility] [phone]   01/23/2014 9:15 AM Sherrie George, MD [facility] [phone]   Future Orders Complete By Expires   Diet - low sodium heart healthy  As directed    Increase activity slowly  As directed        Medication List         aspirin EC 81 MG tablet  Take 81 mg by mouth every morning.     atorvastatin 40 MG tablet  Commonly known as:  LIPITOR  Take 40 mg by mouth daily.     B-D ULTRAFINE III SHORT PEN 31G X  8 MM Misc  Generic drug:  Insulin Pen Needle     diltiazem 240 MG 24 hr capsule  Commonly known as:  CARDIZEM CD  Take 240 mg by mouth every morning.     flecainide 150 MG tablet  Commonly known as:  TAMBOCOR  Take 225 mg by mouth 2 (two) times daily.     LANTUS SOLOSTAR 100 UNIT/ML injection  Generic drug:  insulin glargine  Inject 37 Units into the skin at bedtime. Patient does 27u around 10pm and 10 units a few hours later     levofloxacin 750 MG tablet  Commonly known as:  LEVAQUIN  Take 1 tablet (750 mg total) by mouth daily.     loratadine 10 MG  tablet  Commonly known as:  CLARITIN  Take 10 mg by mouth every morning.     metFORMIN 1000 MG tablet  Commonly known as:  GLUCOPHAGE  Take 1,000 mg by mouth 2 (two) times daily with a meal.     metoprolol succinate 50 MG 24 hr tablet  Commonly known as:  TOPROL-XL  Take 100 mg by mouth at bedtime.     nitroGLYCERIN 0.4 MG SL tablet  Commonly known as:  NITROSTAT  Place 0.4 mg under the tongue every 5 (five) minutes x 3 doses as needed. For chest pain.     warfarin 5 MG tablet  Commonly known as:  COUMADIN  Take 5-7.5 mg by mouth daily. 7.5mg  Sunday, Tuesday, Thursday; 5mg  the rest of the week       No Known Allergies Follow-up Information   Follow up with BOUSKA,DAVID E, MD In 2 weeks. (hospital follow up)    Specialty:  Family Medicine   Contact information:   [address] [place] [address] [phone]        The results of significant diagnostics from this hospitalization (including imaging, microbiology, ancillary and laboratory) are listed below for reference.    Significant Diagnostic Studies: Dg Chest 2 View  01/24/2013   CLINICAL DATA:  Cough, short of breath  EXAM: CHEST  2 VIEW  COMPARISON:  Prior chest x-ray 01/22/2013  FINDINGS: Apparent enlargement of the mediastinum in cardiopericardial silhouette on the present study likely reflects differences in radiographic technique (AP versus PA positioning). Slightly increased pulmonary vascular congestion. Developing left retrocardiac opacity. Surgical clips noted in the region of the GE junction. No support apparatus identified.  IMPRESSION: Developing patchy opacity in the left lung base may reflect atelectasis or infiltrate.  Increased pulmonary vascular congestion without overt edema.   Electronically Signed   By: Malachy Moan M.D.   On: 01/24/2013 07:43   Dg Chest 2 View  01/22/2013   CLINICAL DATA:  Cough. Fever. Flu like symptoms for 2 days.  EXAM: CHEST  2 VIEW  COMPARISON:  07/02/2012.   FINDINGS: Nonspecific diffuse interstitial prominence is present. The cardiopericardial silhouette is upper limits of normal for projection. Surgical clips are present overlying the gastroesophageal junction. Basilar atelectasis. No airspace disease. There is no pleural effusion.  IMPRESSION: Chronic changes of the chest with lower lung volumes and basilar atelectasis.   Electronically Signed   By: Andreas Newport M.D.   On: 01/22/2013 20:14    Microbiology: Recent Results (from the past 240 hour(s))  CULTURE, BLOOD (ROUTINE X 2)     Status: None   Collection Time    01/22/13  9:46 PM      Result Value Range Status   Specimen Description BLOOD ARM RIGHT  Final   Special Requests BOTTLES DRAWN AEROBIC AND ANAEROBIC 10CC   Final   Culture  Setup Time     Final   Value: 01/23/2013 03:14     Performed at [facility]   Culture     Final   Value:        BLOOD CULTURE RECEIVED NO GROWTH TO DATE CULTURE WILL BE HELD FOR 5 DAYS BEFORE ISSUING A FINAL NEGATIVE REPORT     Performed at [facility]   Report Status PENDING   Incomplete  CULTURE, BLOOD (ROUTINE X 2)     Status: None   Collection Time    01/22/13  9:55 PM      Result Value Range Status   Specimen Description BLOOD FOREARM RIGHT   Final   Special Requests BOTTLES DRAWN AEROBIC ONLY 10CC   Final   Culture  Setup Time     Final   Value: 01/23/2013 03:13     Performed at [facility]   Culture     Final   Value:        BLOOD CULTURE RECEIVED NO GROWTH TO DATE CULTURE WILL BE HELD FOR 5 DAYS BEFORE ISSUING A FINAL NEGATIVE REPORT     Performed at [facility]   Report Status PENDING   Incomplete  URINE CULTURE     Status: None   Collection Time    01/23/13 12:00 PM      Result Value Range Status   Specimen Description URINE, CLEAN CATCH   Final   Special Requests Normal   Final   Culture  Setup Time     Final   Value: 01/23/2013 17:15     Performed at [facility] Count     Final    Value: NO GROWTH     Performed at [facility]   Culture     Final   Value: NO GROWTH     Performed at [facility]   Report Status 01/24/2013 FINAL   Final     Labs: Basic Metabolic Panel:  Recent Labs Lab 01/22/13 1914 01/23/13 0413 01/24/13 0430 01/25/13 0345  NA 138 138 141 140  K 4.1 3.8 4.1 4.0  CL 100 101 107 105  CO2 24 25 24 24   GLUCOSE 135* 134* 94 114*  BUN 15 20 14 14   CREATININE 1.00 1.36* 1.08 1.13  CALCIUM 9.1 8.6 8.6 8.9   Liver Function Tests: No results found for this basename: AST, ALT, ALKPHOS, BILITOT, PROT, ALBUMIN,  in the last 168 hours No results found for this basename: LIPASE, AMYLASE,  in the last 168 hours No results found for this basename: AMMONIA,  in the last 168 hours CBC:  Recent Labs Lab 01/22/13 1914 01/23/13 0413  WBC 17.1* 14.3*  NEUTROABS 14.3*  --   HGB 10.8* 9.6*  HCT 34.8* 29.9*  MCV 70.7* 70.0*  PLT 270 232   Cardiac Enzymes:  Recent Labs Lab 01/22/13 2350  TROPONINI <0.30   BNP: BNP (last 3 results)  Recent Labs  01/22/13 1916  PROBNP 265.1*   CBG:  Recent Labs Lab 01/24/13 0649 01/24/13 1122 01/24/13 1613 01/24/13 2159 01/25/13 0751  GLUCAP 103* 123* 136* 153* 85       Signed:  FELIZ ORTIZ, Gracie Gupta  [facility] 01/25/2013, 9:50 AM

## 2013-01-25 NOTE — Progress Notes (Signed)
Client verbalized understanding of discharge instructions.  Ambulated to discharge area.  Refused am meds and stated, "I will take them when I get home."  Charge made aware of client decision.  IV and telemetry removed.

## 2013-01-29 LAB — CULTURE, BLOOD (ROUTINE X 2)
Culture: NO GROWTH
Culture: NO GROWTH

## 2013-02-07 ENCOUNTER — Ambulatory Visit (INDEPENDENT_AMBULATORY_CARE_PROVIDER_SITE_OTHER): Payer: Medicare Other | Admitting: Pharmacist Clinician (PhC)/ Clinical Pharmacy Specialist

## 2013-02-07 VITALS — BP 142/60 | HR 64

## 2013-02-07 DIAGNOSIS — Z7901 Long term (current) use of anticoagulants: Secondary | ICD-10-CM

## 2013-02-07 DIAGNOSIS — I4891 Unspecified atrial fibrillation: Secondary | ICD-10-CM

## 2013-02-07 DIAGNOSIS — I4892 Unspecified atrial flutter: Secondary | ICD-10-CM

## 2013-02-07 MED ORDER — FLECAINIDE ACETATE 150 MG PO TABS: 225.0000 mg | ORAL_TABLET | Freq: Two times a day (BID) | ORAL | Status: DC

## 2013-02-13 IMAGING — CR DG CHEST 2V
2 series · 2 of 2 positions shown · non-contrast
Comparison: 10/09/2011.

CLINICAL DATA: Chest pain.  Shortness of breath.  Cough.  Weakness.

CHEST - 2 VIEW

[w chest pa]
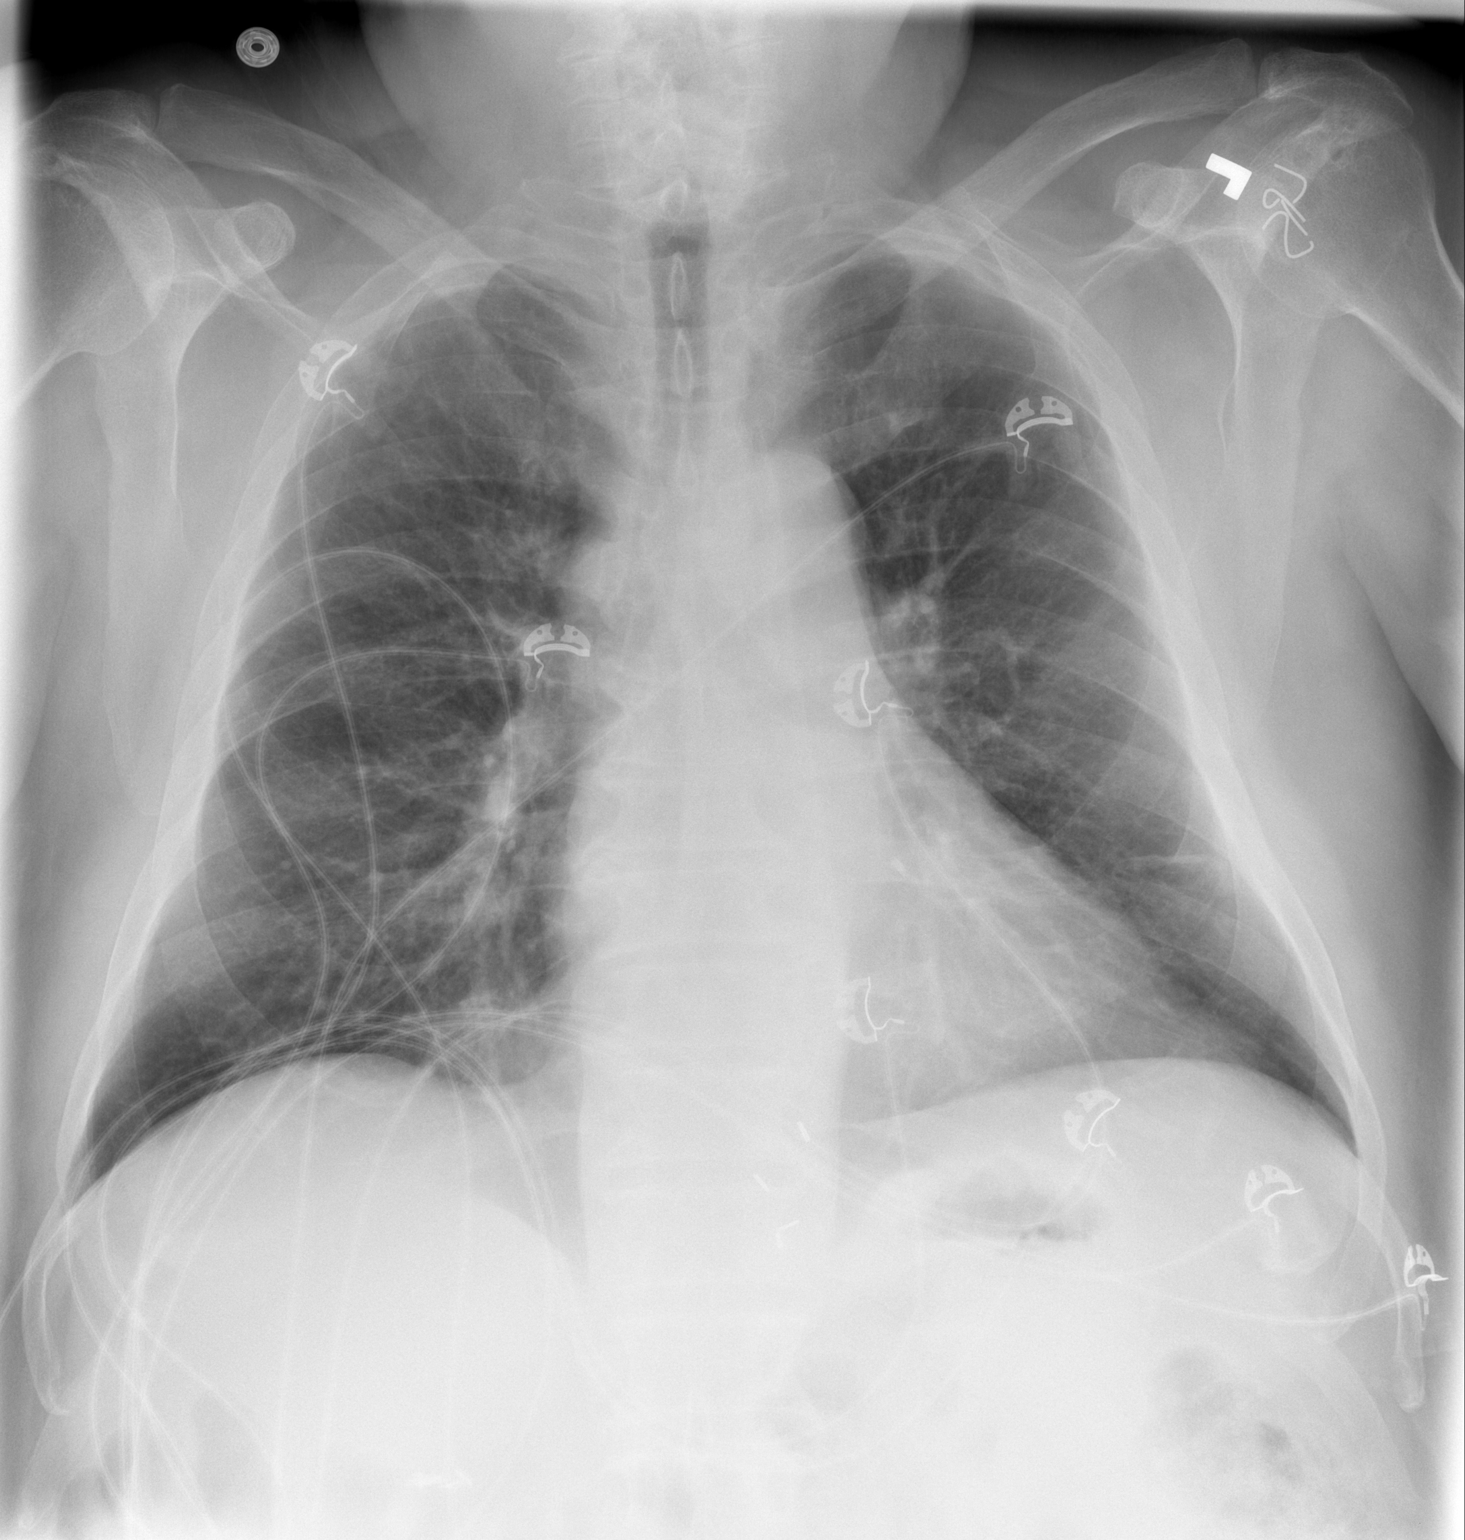

[w chest lat]
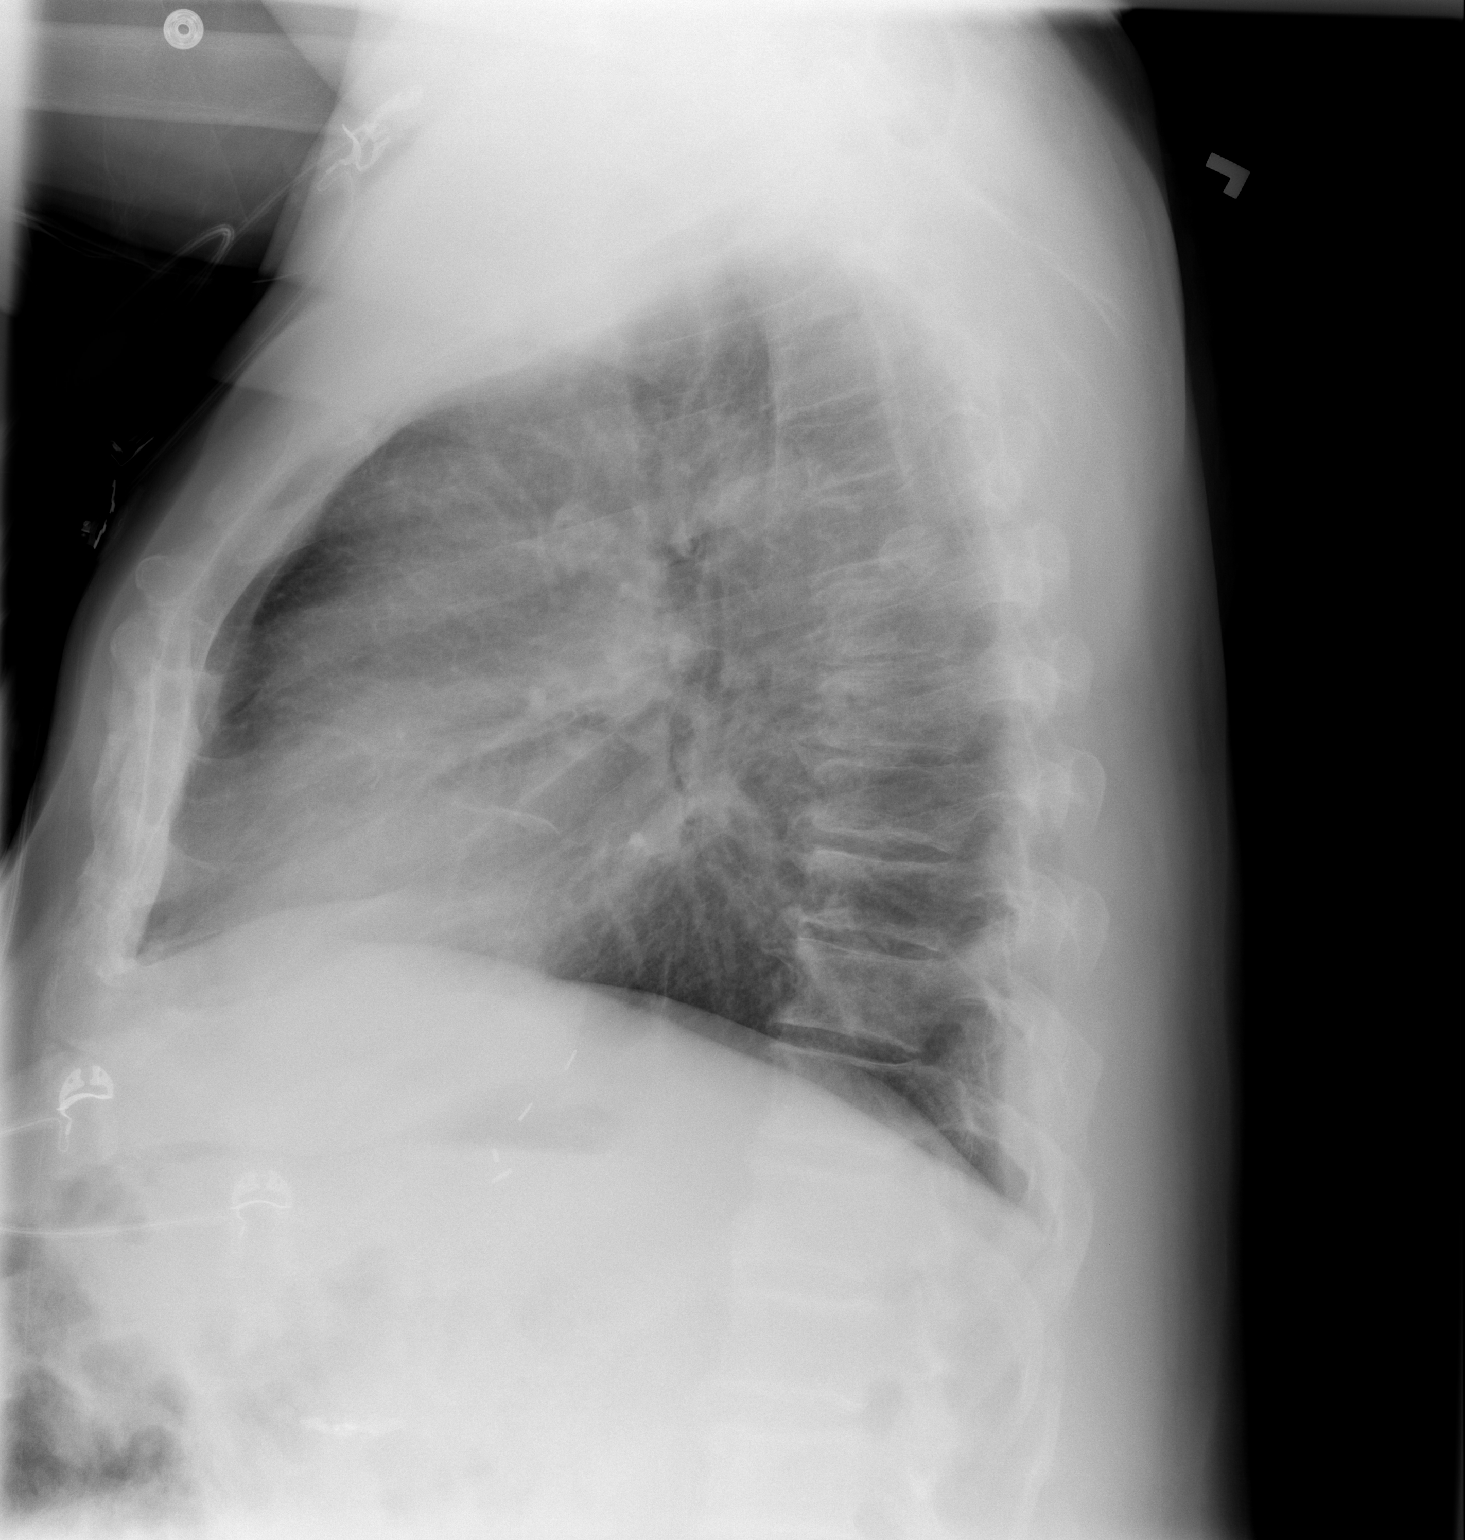

[2 of 2 positions shown; findings below may reference images not displayed]

FINDINGS: Normal heart size and pulmonary vascularity.  Suggestion
of emphysematous changes and scattered fibrosis in the lungs.
Nodular opacity in the left mid lung as seen previously appears to
have an annular appearance today and may be due to scarring.
Nodular opacity projected over the posterior lumbar spine likely
represents hypertrophic change and is stable.  No focal airspace
consolidation.  No blunting of costophrenic angles.  No
pneumothorax.  Mediastinal contours appear intact.  Degenerative
changes throughout the thoracic spine.
IMPRESSION: Stable appearance of the chest since previous study.  No evidence
of active pulmonary disease.

## 2013-02-17 ENCOUNTER — Telehealth: Payer: Self-pay

## 2013-02-19 MED ORDER — PRAVASTATIN SODIUM 40 MG PO TABS: 40.0000 mg | ORAL_TABLET | Freq: Every evening | ORAL | Status: DC

## 2013-02-19 NOTE — Telephone Encounter (Signed)
So, would use Pravastatin 40 mg -- my note discussed possible changing to Crestor.    Try taking 2 pills - if tolerated, lets increase to Pravachol 40mg .  Marykay Lex, MD

## 2013-02-19 NOTE — Telephone Encounter (Signed)
LM notifying daughter of Dr Elissa Hefty instructions to increase patient's Pravastatin to 2 pills daily and if he can tolerate that we will increase Pravastatin to 40 mg. Also told her to call back if patient needed new prescription for Pravastatin.

## 2013-02-19 NOTE — Telephone Encounter (Signed)
Received a message from [facility] to clarify whether patient was taking Pravastatin 20 or Atorvastatin 40. Per [facility], Pravastatin was changed to Atorvastatin in the hospital.  Attempted to call patient/daughter on several occasions.  Finally spoke to talk to patient's daughter today. Per her, they never got Atorvastatin filled and he has been taking Pravastatin without any problems or complaints. She did happen to mention that at his last visit you recommended a medication, she couldn't remember the name - I looked at the last office note and Crestor was mentioned.  Please advise? Can he stay on Pravastatin or start Crestor?

## 2013-02-19 NOTE — Telephone Encounter (Signed)
Sent in new prescription for Pravastatin 40 mg QD to pharmacy electronically.

## 2013-02-28 IMAGING — CR DG CHEST 2V
2 series · 2 of 2 positions shown · non-contrast
Comparison: 12/04/2011

CLINICAL DATA: Chest pain

CHEST - 2 VIEW

[w chest pa]
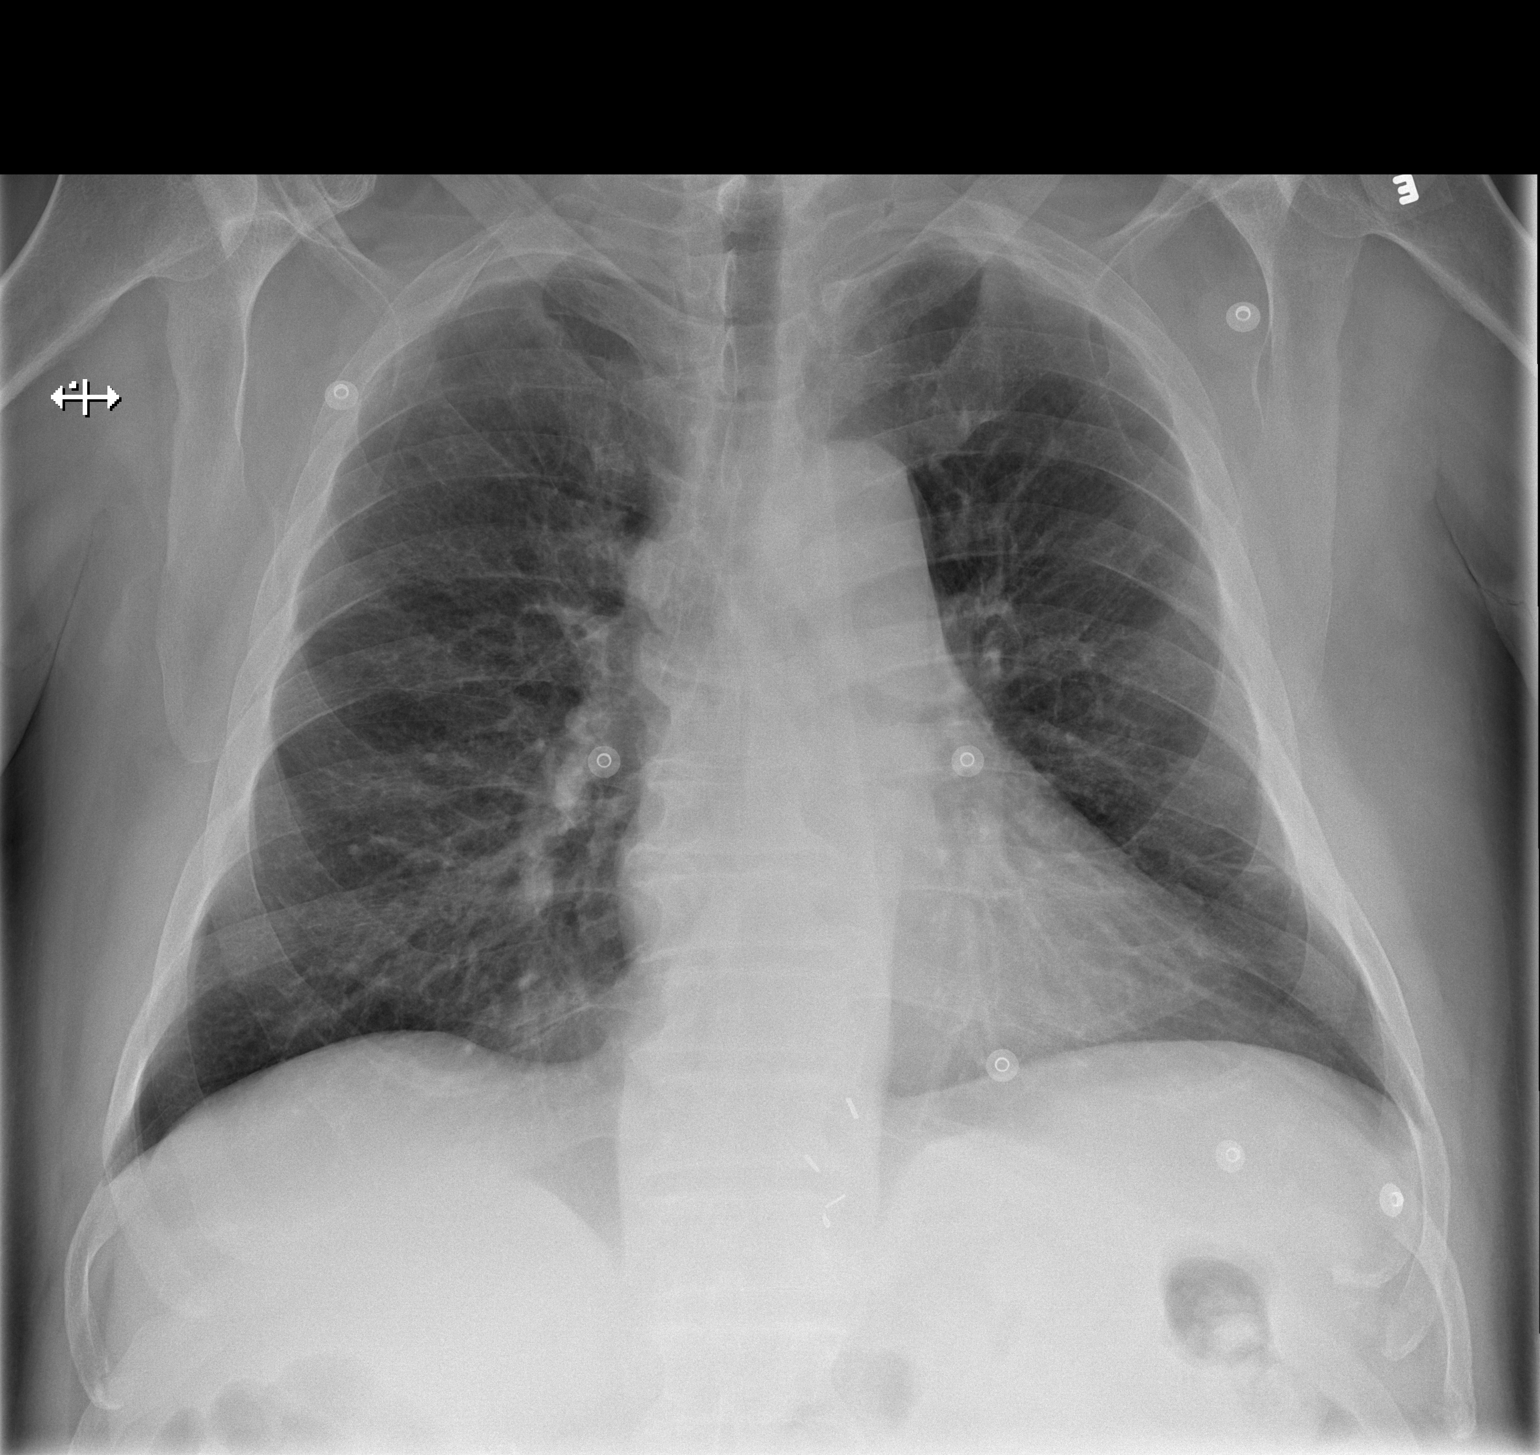

[w chest lat]
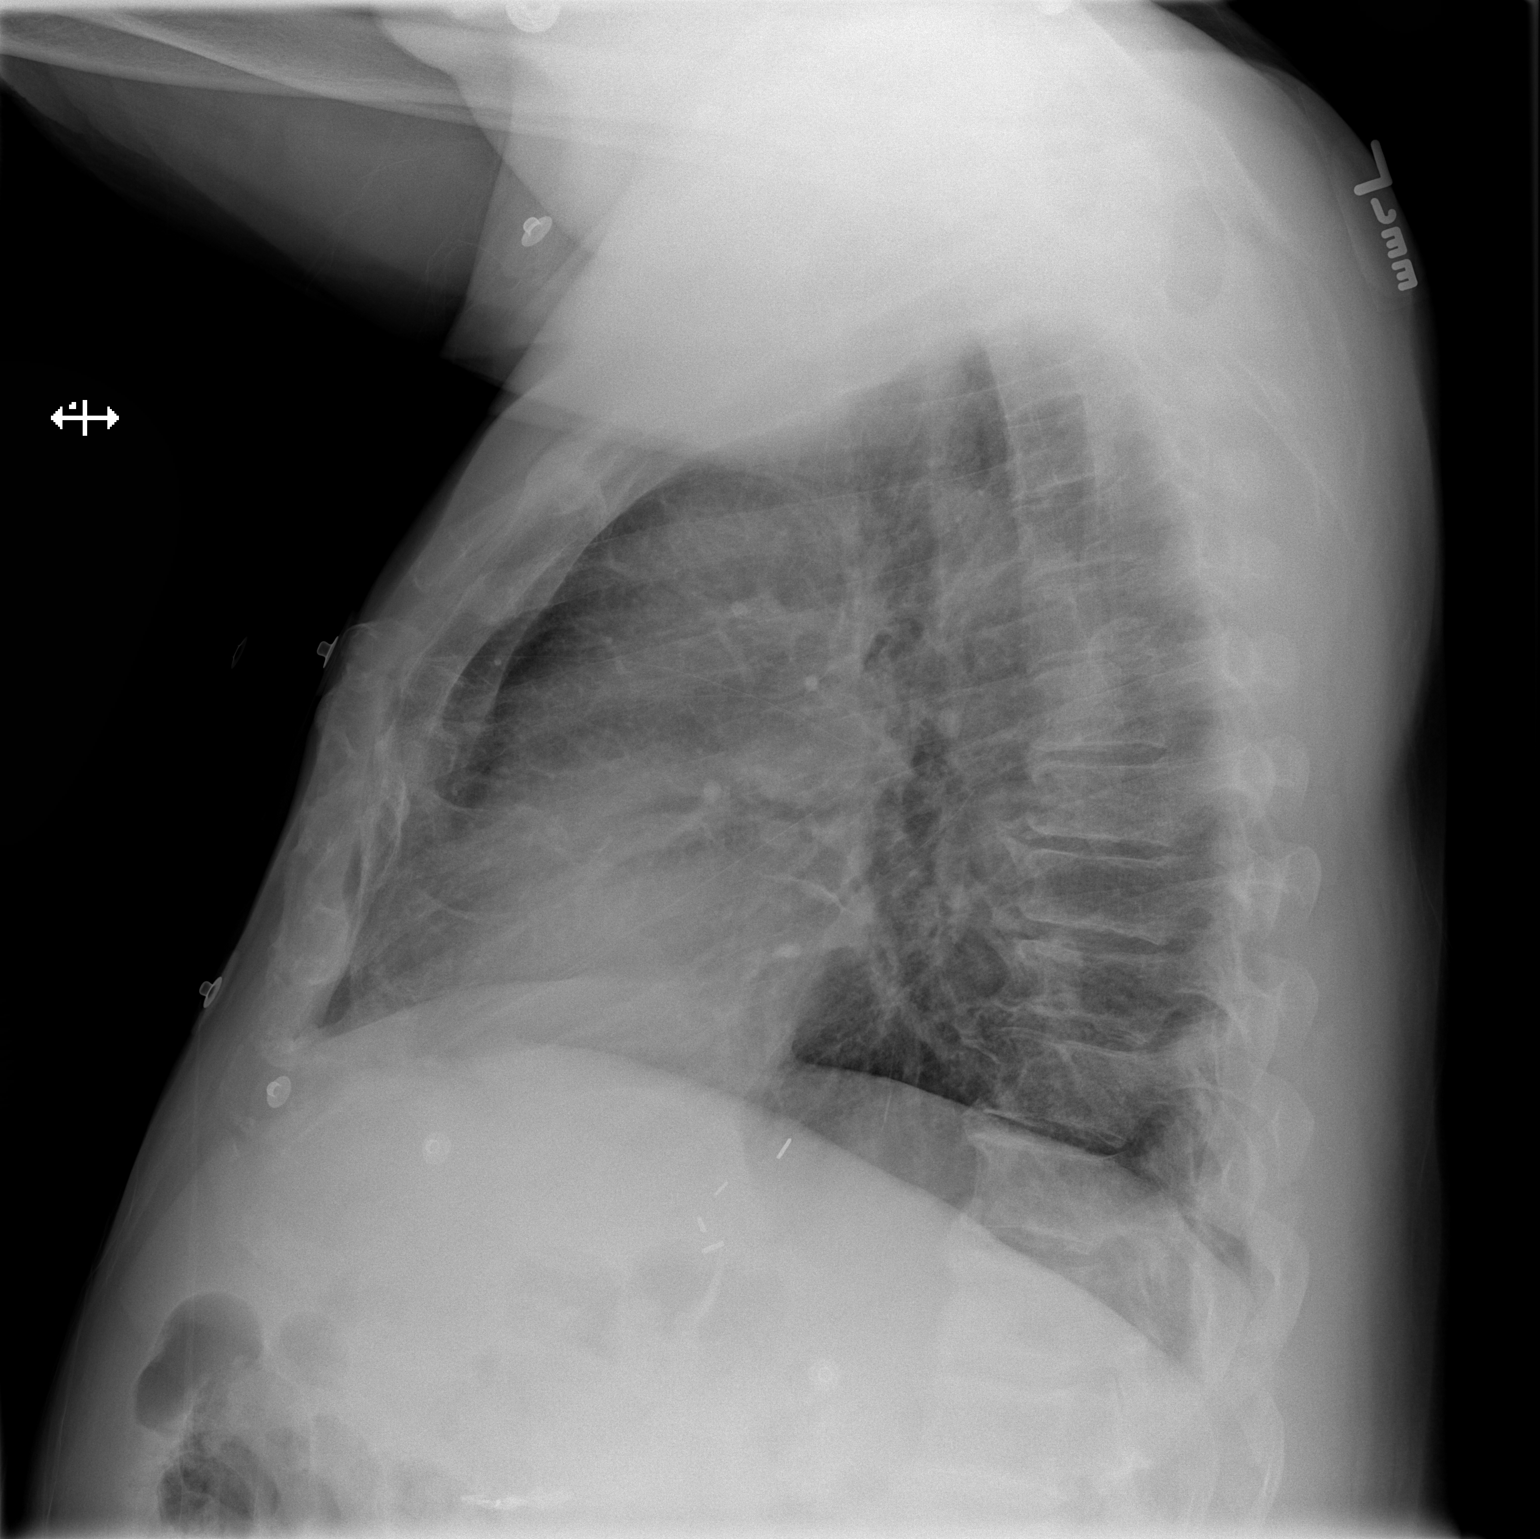

[2 of 2 positions shown; findings below may reference images not displayed]

FINDINGS: Normal heart size.  Left lower lung zone linear scarring.
Bronchitic changes.  Nodular density projects over the upper
thoracic spine.  No obvious nodule on the PA view.
IMPRESSION: Possible nodule projecting over the thoracic spine.  CT is
recommended.

## 2013-02-28 IMAGING — CT CT CHEST W/ CM
2 of 4 series · 15 of 36 positions shown, 18 images · IV contrast (100ml omni 300)
Comparison: Plain film of earlier today.  No prior CT.

CLINICAL DATA: Possible posterior nodule on plain film chest.
Smoker.

CT CHEST WITH CONTRAST
TECHNIQUE: Multidetector CT imaging of the chest was performed
following the standard protocol during bolus administration of
intravenous contrast.
Contrast: 100mL OMNIPAQUE IOHEXOL 300 MG/ML  SOLN

[Series 2: routine chest · axial · 0.73mm/px · z∈[-308,-54]mm · 12 of 61 slices shown, 15 images]
[im 5/61  mediastinal]
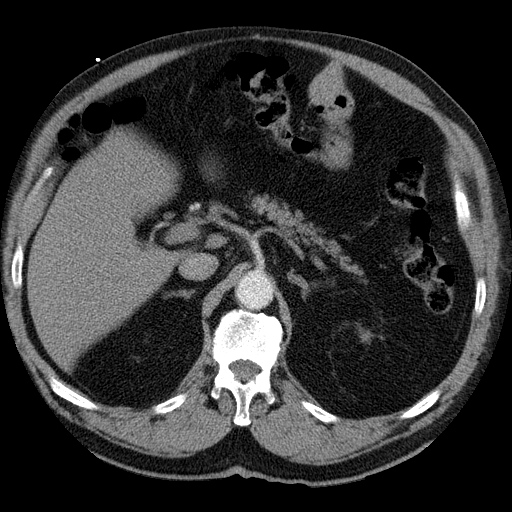
[im 5/61  lung]
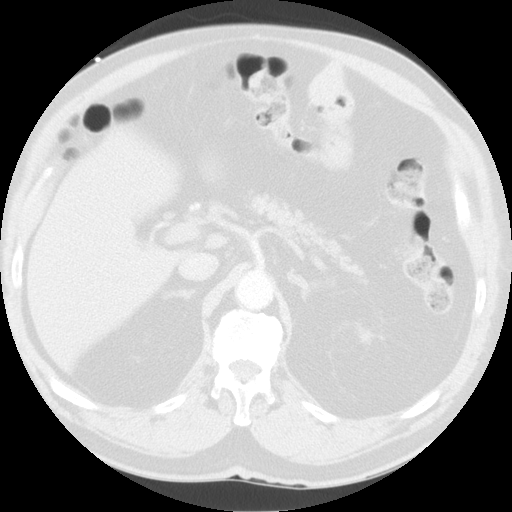
[im 9/61  lung]
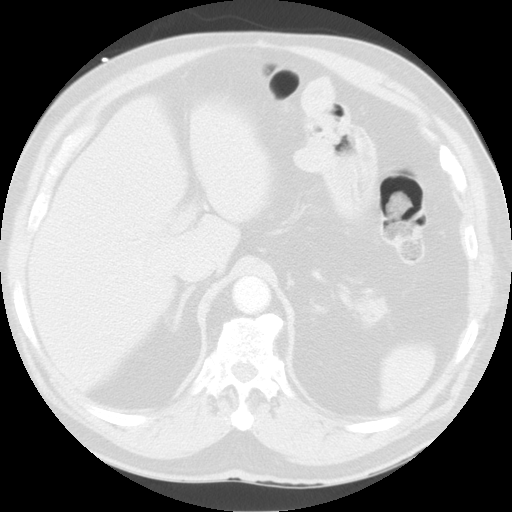
[im 13/61  lung]
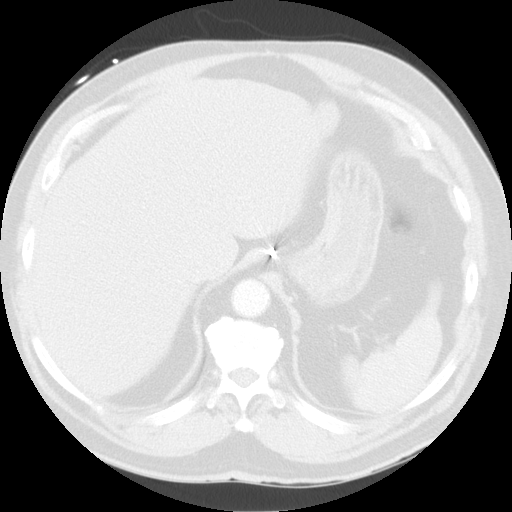
[im 18/61  lung]
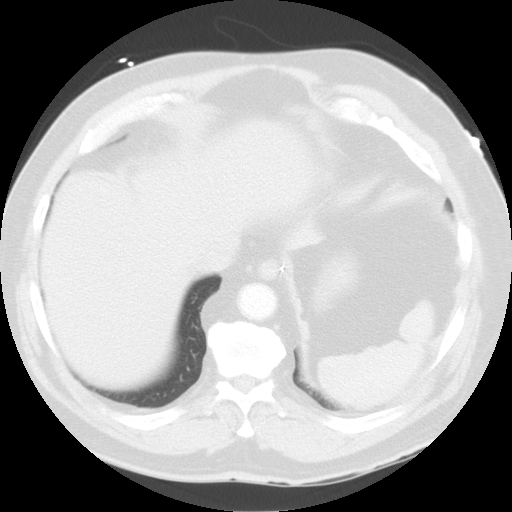
[im 22/61  mediastinal]
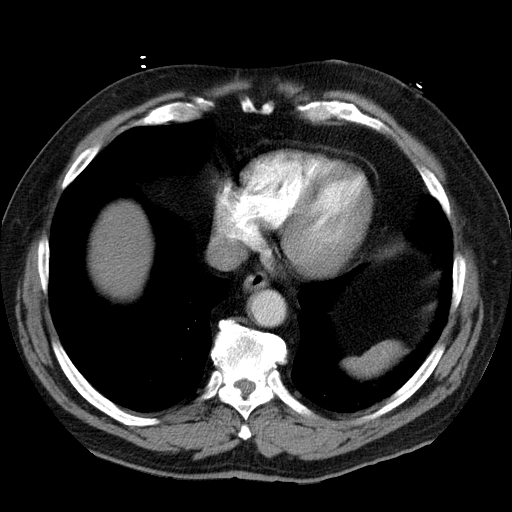
[im 22/61  lung]
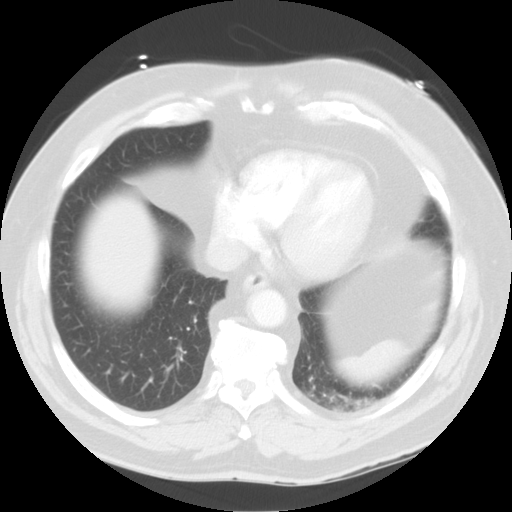
[im 26/61  lung]
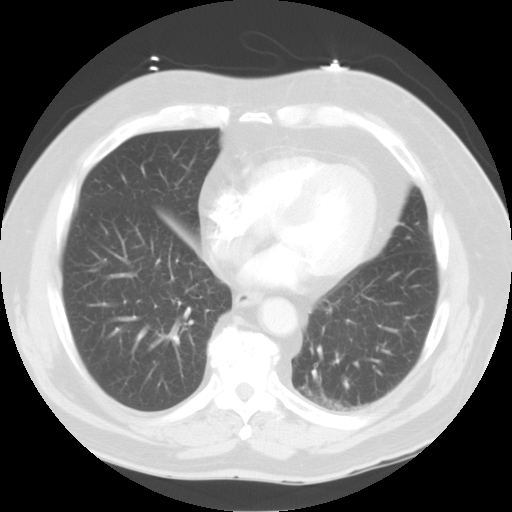
[im 35/61  lung]
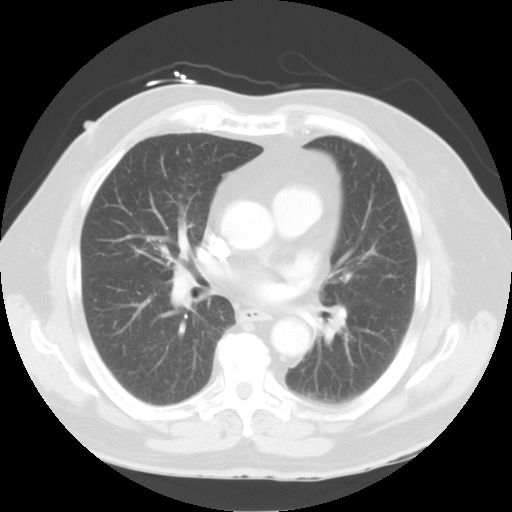
[im 39/61  lung]
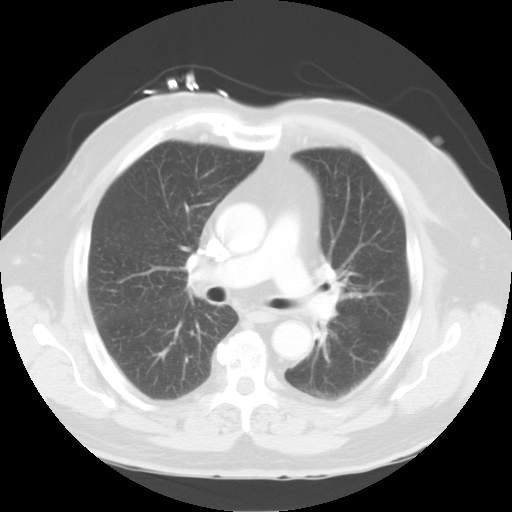
[im 43/61  mediastinal]
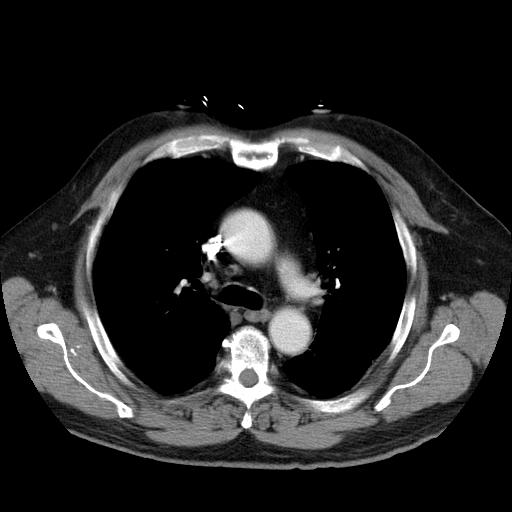
[im 43/61  lung]
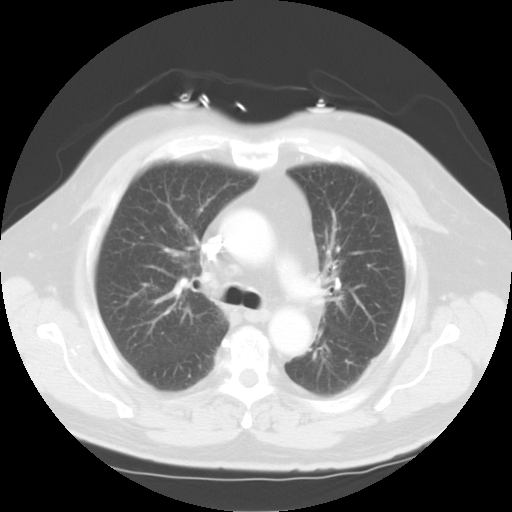
[im 48/61  lung]
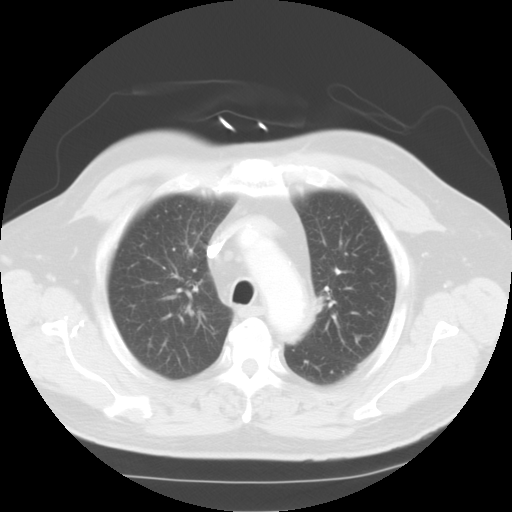
[im 52/61  lung]
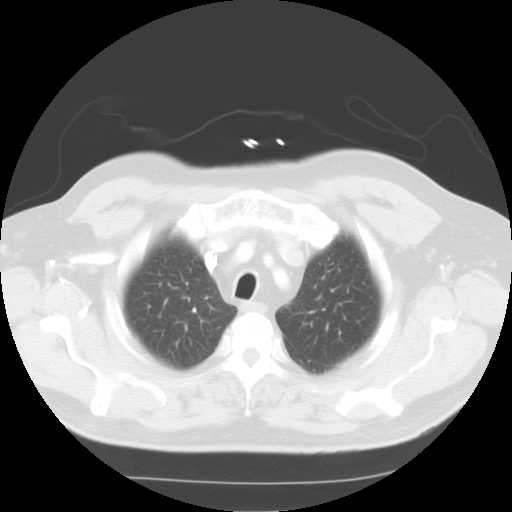
[im 56/61  lung]
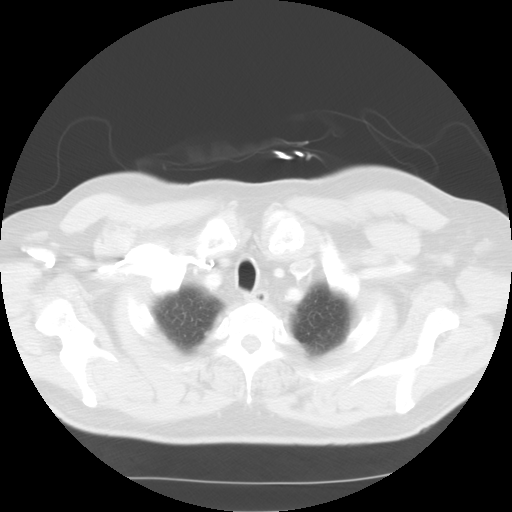

[Series 401: cor · coronal · 0.73mm/px · 3 of 113 slices shown]
[im 23/113  lung]
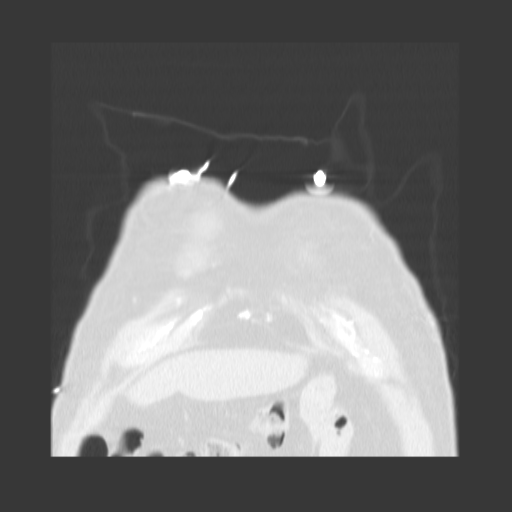
[im 45/113  lung]
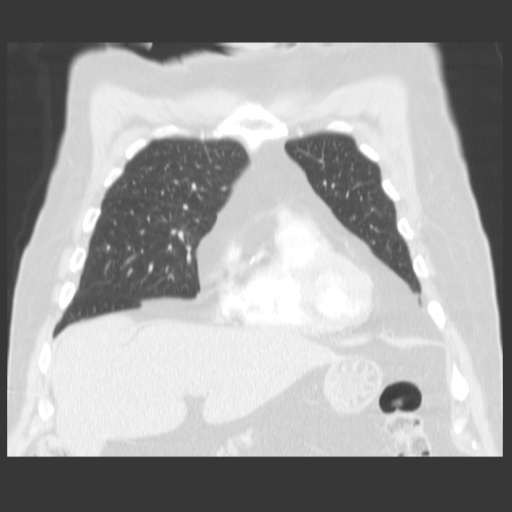
[im 68/113  lung]
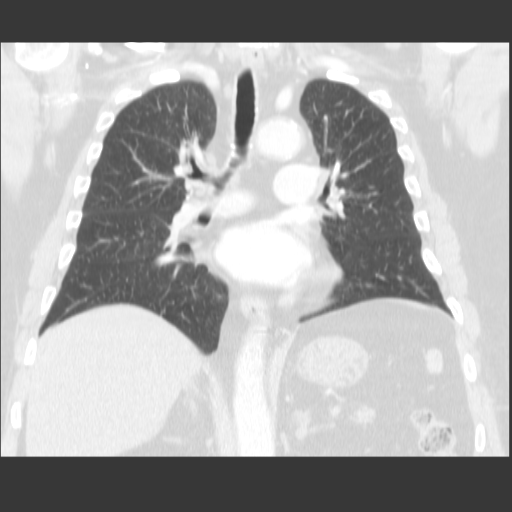

[15 of 36 positions shown; findings below may reference images not displayed]

FINDINGS: Lung windows demonstrate mild nonspecific bronchial wall
thickening to the lower lobes.
2 mm right upper lobe lung nodule on image 17.  2 mm right middle
lobe lung nodule on image 30
3 mm superior segment right lower lobe nodule on image 18.
No pulmonary parenchymal correlate for the plain film abnormality.

Soft tissue windows demonstrate mixing phenomenon in the left
jugular vein.  Aortic atherosclerosis and tortuosity of the
descending segment.  Normal heart size with mild left ventricular
hypertrophy. Multivessel coronary artery atherosclerosis.  No
pericardial or pleural effusion.  Mild lipomatous hypertrophy of
the interatrial septum.  Small middle mediastinal nodes, without
mediastinal or hilar adenopathy.
Surgical changes at the gastroesophageal junction.

Limited abdominal imaging demonstrates probable mild hepatic
steatosis.  Probable partial gastrectomy, incompletely imaged.
Mildly prominent porta hepatis nodes which are likely secondary to
steatosis/reactive.

No acute osseous abnormality.  The plain film abnormality was
likely secondary to one of numerous upper thoracic vertebral
osteophytes.
IMPRESSION: 1.  No dominant pulmonary nodule.  Plain film abnormality was
secondary to a vertebral osteophyte.
2.  Tiny lung nodules. Given risk factors for bronchogenic
carcinoma, follow-up chest CT at 1 year is recommended.  This
recommendation follows the consensus statement:  "Guidelines for
Management of Small Pulmonary Nodules Detected on CT Scans:  A
Statement from the [HOSPITAL]" as published in Radiology
1332; [DATE].  Available online at:
[URL]
3.  Mild hepatic steatosis.
4.  Mild left ventricular hypertrophy with multivessel coronary
artery atherosclerosis.

## 2013-03-07 ENCOUNTER — Ambulatory Visit (INDEPENDENT_AMBULATORY_CARE_PROVIDER_SITE_OTHER): Payer: Medicare Other | Admitting: Pharmacist Clinician (PhC)/ Clinical Pharmacy Specialist

## 2013-03-07 VITALS — BP 130/62 | HR 76

## 2013-03-07 DIAGNOSIS — Z7901 Long term (current) use of anticoagulants: Secondary | ICD-10-CM

## 2013-03-07 DIAGNOSIS — I4892 Unspecified atrial flutter: Secondary | ICD-10-CM

## 2013-03-07 DIAGNOSIS — I4891 Unspecified atrial fibrillation: Secondary | ICD-10-CM

## 2013-03-16 ENCOUNTER — Encounter (HOSPITAL_COMMUNITY): Payer: Self-pay | Admitting: Emergency Medicine

## 2013-03-16 ENCOUNTER — Emergency Department (HOSPITAL_COMMUNITY)
Admission: EM | Admit: 2013-03-16 | Discharge: 2013-03-16 | Disposition: A | Discharge status: Home / Self Care | Payer: Medicare Other | Attending: Emergency Medicine | Admitting: Emergency Medicine

## 2013-03-16 ENCOUNTER — Emergency Department (HOSPITAL_COMMUNITY): Payer: Medicare Other

## 2013-03-16 DIAGNOSIS — I4891 Unspecified atrial fibrillation: Secondary | ICD-10-CM | POA: Insufficient documentation

## 2013-03-16 DIAGNOSIS — I4892 Unspecified atrial flutter: Secondary | ICD-10-CM | POA: Insufficient documentation

## 2013-03-16 DIAGNOSIS — M25551 Pain in right hip: Secondary | ICD-10-CM

## 2013-03-16 DIAGNOSIS — Z794 Long term (current) use of insulin: Secondary | ICD-10-CM | POA: Insufficient documentation

## 2013-03-16 DIAGNOSIS — J449 Chronic obstructive pulmonary disease, unspecified: Secondary | ICD-10-CM | POA: Insufficient documentation

## 2013-03-16 DIAGNOSIS — M5412 Radiculopathy, cervical region: Secondary | ICD-10-CM | POA: Insufficient documentation

## 2013-03-16 DIAGNOSIS — I10 Essential (primary) hypertension: Secondary | ICD-10-CM | POA: Insufficient documentation

## 2013-03-16 DIAGNOSIS — Z79899 Other long term (current) drug therapy: Secondary | ICD-10-CM | POA: Insufficient documentation

## 2013-03-16 DIAGNOSIS — E785 Hyperlipidemia, unspecified: Secondary | ICD-10-CM | POA: Insufficient documentation

## 2013-03-16 DIAGNOSIS — Z7901 Long term (current) use of anticoagulants: Secondary | ICD-10-CM | POA: Insufficient documentation

## 2013-03-16 DIAGNOSIS — I739 Peripheral vascular disease, unspecified: Secondary | ICD-10-CM | POA: Insufficient documentation

## 2013-03-16 DIAGNOSIS — M549 Dorsalgia, unspecified: Secondary | ICD-10-CM | POA: Insufficient documentation

## 2013-03-16 DIAGNOSIS — Z7982 Long term (current) use of aspirin: Secondary | ICD-10-CM | POA: Insufficient documentation

## 2013-03-16 DIAGNOSIS — E119 Type 2 diabetes mellitus without complications: Secondary | ICD-10-CM | POA: Insufficient documentation

## 2013-03-16 DIAGNOSIS — I5032 Chronic diastolic (congestive) heart failure: Secondary | ICD-10-CM | POA: Insufficient documentation

## 2013-03-16 DIAGNOSIS — J4489 Other specified chronic obstructive pulmonary disease: Secondary | ICD-10-CM | POA: Insufficient documentation

## 2013-03-16 DIAGNOSIS — M25559 Pain in unspecified hip: Secondary | ICD-10-CM | POA: Insufficient documentation

## 2013-03-16 DIAGNOSIS — I2510 Atherosclerotic heart disease of native coronary artery without angina pectoris: Secondary | ICD-10-CM | POA: Insufficient documentation

## 2013-03-16 DIAGNOSIS — Z8719 Personal history of other diseases of the digestive system: Secondary | ICD-10-CM | POA: Insufficient documentation

## 2013-03-16 DIAGNOSIS — F17200 Nicotine dependence, unspecified, uncomplicated: Secondary | ICD-10-CM | POA: Insufficient documentation

## 2013-03-16 DIAGNOSIS — Z86718 Personal history of other venous thrombosis and embolism: Secondary | ICD-10-CM | POA: Insufficient documentation

## 2013-03-16 MED ORDER — HYDROCODONE-ACETAMINOPHEN 5-325 MG PO TABS
2.0000 | ORAL_TABLET | Freq: Once | ORAL | Status: AC
  Administered 2013-03-16: 2 via ORAL
  Filled 2013-03-16: qty 2

## 2013-03-16 MED ORDER — HYDROCODONE-ACETAMINOPHEN 5-325 MG PO TABS: 1.0000 | ORAL_TABLET | ORAL | Status: DC | PRN
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
## 2013-03-16 NOTE — ED Notes (Signed)
Pt reports in the beginning of the week, went out onto his patio and put his shoes on. When he turned he felt a pain in his right hip. Pt ambulatory in waiting area. Pt tried tylenol last night without relief.

## 2013-03-19 NOTE — ED Provider Notes (Signed)
Medical screening examination/treatment/procedure(s) were performed by non-physician practitioner and as supervising physician I was immediately available for consultation/collaboration.  EKG Interpretation   None       Date: 03/19/2013  Rate: 82  Rhythm: normal sinus rhythm  QRS Axis: left  Intervals: normal  ST/T Wave abnormalities: normal  Conduction Disutrbances:none  Narrative Interpretation:   Old EKG Reviewed: unchanged    Phyliss Hulick R. Rubin Payor, MD 03/19/13 [phone]

## 2013-03-25 ENCOUNTER — Telehealth: Payer: Self-pay | Admitting: *Deleted

## 2013-03-25 NOTE — Telephone Encounter (Signed)
FAXED CLARIFICATION OF MEDICATION  DIRECTION FLECAINIDE 150 MG  TAKE 1.5 TABLET BY MOUTH TWICE A DAY

## 2013-04-04 ENCOUNTER — Ambulatory Visit (INDEPENDENT_AMBULATORY_CARE_PROVIDER_SITE_OTHER): Payer: Medicare Other | Admitting: Pharmacist Clinician (PhC)/ Clinical Pharmacy Specialist

## 2013-04-04 VITALS — BP 130/58 | HR 76

## 2013-04-04 DIAGNOSIS — I4891 Unspecified atrial fibrillation: Secondary | ICD-10-CM

## 2013-04-04 DIAGNOSIS — Z7901 Long term (current) use of anticoagulants: Secondary | ICD-10-CM

## 2013-04-04 DIAGNOSIS — I4892 Unspecified atrial flutter: Secondary | ICD-10-CM

## 2013-04-04 DIAGNOSIS — Z5181 Encounter for therapeutic drug level monitoring: Secondary | ICD-10-CM

## 2013-04-04 LAB — POCT INR: INR: 3.6

## 2013-04-16 ENCOUNTER — Emergency Department (HOSPITAL_COMMUNITY)
Admission: EM | Admit: 2013-04-16 | Discharge: 2013-04-16 | Disposition: A | Discharge status: Home / Self Care | Payer: Medicare Other | Attending: Emergency Medicine | Admitting: Emergency Medicine

## 2013-04-16 ENCOUNTER — Emergency Department (HOSPITAL_COMMUNITY): Payer: Medicare Other

## 2013-04-16 ENCOUNTER — Encounter (HOSPITAL_COMMUNITY): Payer: Self-pay | Admitting: Emergency Medicine

## 2013-04-16 DIAGNOSIS — Z8701 Personal history of pneumonia (recurrent): Secondary | ICD-10-CM | POA: Insufficient documentation

## 2013-04-16 DIAGNOSIS — F17200 Nicotine dependence, unspecified, uncomplicated: Secondary | ICD-10-CM | POA: Insufficient documentation

## 2013-04-16 DIAGNOSIS — J449 Chronic obstructive pulmonary disease, unspecified: Secondary | ICD-10-CM | POA: Insufficient documentation

## 2013-04-16 DIAGNOSIS — Z8719 Personal history of other diseases of the digestive system: Secondary | ICD-10-CM | POA: Insufficient documentation

## 2013-04-16 DIAGNOSIS — Z79899 Other long term (current) drug therapy: Secondary | ICD-10-CM | POA: Insufficient documentation

## 2013-04-16 DIAGNOSIS — I2510 Atherosclerotic heart disease of native coronary artery without angina pectoris: Secondary | ICD-10-CM | POA: Insufficient documentation

## 2013-04-16 DIAGNOSIS — R059 Cough, unspecified: Secondary | ICD-10-CM | POA: Insufficient documentation

## 2013-04-16 DIAGNOSIS — M479 Spondylosis, unspecified: Secondary | ICD-10-CM | POA: Insufficient documentation

## 2013-04-16 DIAGNOSIS — D649 Anemia, unspecified: Secondary | ICD-10-CM | POA: Insufficient documentation

## 2013-04-16 DIAGNOSIS — Z95818 Presence of other cardiac implants and grafts: Secondary | ICD-10-CM | POA: Insufficient documentation

## 2013-04-16 DIAGNOSIS — Z7901 Long term (current) use of anticoagulants: Secondary | ICD-10-CM | POA: Insufficient documentation

## 2013-04-16 DIAGNOSIS — Z7982 Long term (current) use of aspirin: Secondary | ICD-10-CM | POA: Insufficient documentation

## 2013-04-16 DIAGNOSIS — I4891 Unspecified atrial fibrillation: Secondary | ICD-10-CM | POA: Insufficient documentation

## 2013-04-16 DIAGNOSIS — Z794 Long term (current) use of insulin: Secondary | ICD-10-CM | POA: Insufficient documentation

## 2013-04-16 DIAGNOSIS — E119 Type 2 diabetes mellitus without complications: Secondary | ICD-10-CM | POA: Insufficient documentation

## 2013-04-16 DIAGNOSIS — Z86718 Personal history of other venous thrombosis and embolism: Secondary | ICD-10-CM | POA: Insufficient documentation

## 2013-04-16 DIAGNOSIS — I4892 Unspecified atrial flutter: Secondary | ICD-10-CM | POA: Insufficient documentation

## 2013-04-16 DIAGNOSIS — I5032 Chronic diastolic (congestive) heart failure: Secondary | ICD-10-CM | POA: Insufficient documentation

## 2013-04-16 DIAGNOSIS — J4489 Other specified chronic obstructive pulmonary disease: Secondary | ICD-10-CM | POA: Insufficient documentation

## 2013-04-16 DIAGNOSIS — I10 Essential (primary) hypertension: Secondary | ICD-10-CM | POA: Insufficient documentation

## 2013-04-16 DIAGNOSIS — E785 Hyperlipidemia, unspecified: Secondary | ICD-10-CM | POA: Insufficient documentation

## 2013-04-16 DIAGNOSIS — R05 Cough: Secondary | ICD-10-CM | POA: Insufficient documentation

## 2013-04-16 LAB — BASIC METABOLIC PANEL
BUN: 20 mg/dL (ref 6–23)
CALCIUM: 8.9 mg/dL (ref 8.4–10.5)
CO2: 19 meq/L (ref 19–32)
CREATININE: 1.04 mg/dL (ref 0.50–1.35)
Chloride: 101 mEq/L (ref 96–112)
GFR calc Af Amer: 80 mL/min — ABNORMAL LOW (ref >90)
GFR, EST NON AFRICAN AMERICAN: 69 mL/min — AB (ref >90)
Glucose, Bld: 149 mg/dL — ABNORMAL HIGH (ref 70–99)
Potassium: 4.6 mEq/L (ref 3.7–5.3)
Sodium: 140 mEq/L (ref 137–147)

## 2013-04-16 LAB — GLUCOSE, CAPILLARY: GLUCOSE-CAPILLARY: 154 mg/dL — AB (ref 70–99)

## 2013-04-16 LAB — CBC WITH DIFFERENTIAL/PLATELET
Basophils Absolute: 0.1 10*3/uL (ref 0.0–0.1)
Basophils Relative: 1 % (ref 0–1)
EOS ABS: 0.2 10*3/uL (ref 0.0–0.7)
Eosinophils Relative: 2 % (ref 0–5)
HCT: 32.1 % — ABNORMAL LOW (ref 39.0–52.0)
Hemoglobin: 8.8 g/dL — ABNORMAL LOW (ref 13.0–17.0)
LYMPHS ABS: 2.6 10*3/uL (ref 0.7–4.0)
Lymphocytes Relative: 26 % (ref 12–46)
MCH: 18.6 pg — ABNORMAL LOW (ref 26.0–34.0)
MCHC: 27.4 g/dL — AB (ref 30.0–36.0)
MCV: 68 fL — AB (ref 78.0–100.0)
MONO ABS: 0.9 10*3/uL (ref 0.1–1.0)
Monocytes Relative: 9 % (ref 3–12)
Neutro Abs: 6.1 10*3/uL (ref 1.7–7.7)
Neutrophils Relative %: 62 % (ref 43–77)
Platelets: 353 10*3/uL (ref 150–400)
RBC: 4.72 MIL/uL (ref 4.22–5.81)
RDW: 17.2 % — AB (ref 11.5–15.5)
WBC: 9.9 10*3/uL (ref 4.0–10.5)

## 2013-04-16 MED ORDER — FERROUS SULFATE 325 (65 FE) MG PO TABS
325.0000 mg | ORAL_TABLET | Freq: Once | ORAL | Status: AC
  Administered 2013-04-16: 325 mg via ORAL
  Filled 2013-04-16: qty 1
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
## 2013-04-16 NOTE — Discharge Instructions (Signed)
Follow up with your doctor as scheduled. Refer to attached documents for more information. Return to the ED with worsening or concerning symptoms.

## 2013-04-16 NOTE — ED Notes (Signed)
Per EMS: Pt states that his blood sugar was high.  When EMS got there, it was 138.  Pt then stated he was hot and still wanted to come to the ER.

## 2013-04-16 NOTE — ED Notes (Signed)
Pt ambulatory to exam room with steady gait.  

## 2013-04-17 NOTE — ED Provider Notes (Signed)
Medical screening examination/treatment/procedure(s) were performed by non-physician practitioner and as supervising physician I was immediately available for consultation/collaboration.  EKG Interpretation   None        Sourish Allender R. Cristan Scherzer, MD 04/17/13 0002 

## 2013-04-18 ENCOUNTER — Ambulatory Visit (INDEPENDENT_AMBULATORY_CARE_PROVIDER_SITE_OTHER): Payer: Medicare Other | Admitting: Pharmacist Clinician (PhC)/ Clinical Pharmacy Specialist

## 2013-04-18 VITALS — BP 118/56 | HR 68

## 2013-04-18 DIAGNOSIS — I4892 Unspecified atrial flutter: Secondary | ICD-10-CM

## 2013-04-18 DIAGNOSIS — Z7901 Long term (current) use of anticoagulants: Secondary | ICD-10-CM

## 2013-04-18 DIAGNOSIS — I4891 Unspecified atrial fibrillation: Secondary | ICD-10-CM

## 2013-04-18 LAB — POCT INR: INR: 2.1

## 2013-05-16 ENCOUNTER — Ambulatory Visit: Payer: Medicare Other | Admitting: Pharmacist Clinician (PhC)/ Clinical Pharmacy Specialist

## 2013-05-30 ENCOUNTER — Ambulatory Visit (INDEPENDENT_AMBULATORY_CARE_PROVIDER_SITE_OTHER): Payer: Medicare Other | Admitting: Pharmacist Clinician (PhC)/ Clinical Pharmacy Specialist

## 2013-05-30 VITALS — BP 120/64 | HR 68

## 2013-05-30 DIAGNOSIS — I4892 Unspecified atrial flutter: Secondary | ICD-10-CM

## 2013-05-30 DIAGNOSIS — Z7901 Long term (current) use of anticoagulants: Secondary | ICD-10-CM

## 2013-05-30 DIAGNOSIS — I4891 Unspecified atrial fibrillation: Secondary | ICD-10-CM

## 2013-05-30 LAB — POCT INR: INR: 1.7

## 2013-06-06 DIAGNOSIS — E782 Mixed hyperlipidemia: Secondary | ICD-10-CM | POA: Insufficient documentation

## 2013-06-20 ENCOUNTER — Ambulatory Visit (INDEPENDENT_AMBULATORY_CARE_PROVIDER_SITE_OTHER): Payer: Medicare Other | Admitting: Pharmacist Clinician (PhC)/ Clinical Pharmacy Specialist

## 2013-06-20 VITALS — BP 138/62 | HR 76

## 2013-06-20 DIAGNOSIS — Z7901 Long term (current) use of anticoagulants: Secondary | ICD-10-CM

## 2013-06-20 DIAGNOSIS — I4892 Unspecified atrial flutter: Secondary | ICD-10-CM

## 2013-06-20 DIAGNOSIS — I4891 Unspecified atrial fibrillation: Secondary | ICD-10-CM

## 2013-06-20 LAB — POCT INR: INR: 2

## 2013-07-18 ENCOUNTER — Ambulatory Visit (INDEPENDENT_AMBULATORY_CARE_PROVIDER_SITE_OTHER): Payer: Medicare Other | Admitting: Pharmacist Clinician (PhC)/ Clinical Pharmacy Specialist

## 2013-07-18 ENCOUNTER — Ambulatory Visit (INDEPENDENT_AMBULATORY_CARE_PROVIDER_SITE_OTHER): Payer: Medicare Other | Admitting: Cardiology

## 2013-07-18 ENCOUNTER — Encounter: Payer: Self-pay | Admitting: Cardiology

## 2013-07-18 VITALS — BP 142/88 | HR 76 | Ht 66.0 in | Wt 210.6 lb

## 2013-07-18 DIAGNOSIS — I5032 Chronic diastolic (congestive) heart failure: Secondary | ICD-10-CM

## 2013-07-18 DIAGNOSIS — I509 Heart failure, unspecified: Secondary | ICD-10-CM

## 2013-07-18 DIAGNOSIS — I4891 Unspecified atrial fibrillation: Secondary | ICD-10-CM

## 2013-07-18 DIAGNOSIS — Z7189 Other specified counseling: Secondary | ICD-10-CM

## 2013-07-18 DIAGNOSIS — I4892 Unspecified atrial flutter: Secondary | ICD-10-CM

## 2013-07-18 DIAGNOSIS — Z7901 Long term (current) use of anticoagulants: Secondary | ICD-10-CM

## 2013-07-18 DIAGNOSIS — I10 Essential (primary) hypertension: Secondary | ICD-10-CM

## 2013-07-18 DIAGNOSIS — F17200 Nicotine dependence, unspecified, uncomplicated: Secondary | ICD-10-CM

## 2013-07-18 DIAGNOSIS — I2510 Atherosclerotic heart disease of native coronary artery without angina pectoris: Secondary | ICD-10-CM

## 2013-07-18 DIAGNOSIS — Z716 Tobacco abuse counseling: Secondary | ICD-10-CM

## 2013-07-18 DIAGNOSIS — E669 Obesity, unspecified: Secondary | ICD-10-CM

## 2013-07-18 LAB — POCT INR: INR: 2.4

## 2013-07-18 NOTE — Patient Instructions (Signed)
Your physician recommends that you schedule a follow-up appointment in: 6 months  Your physician has recommended you make the following change in your medication: Hold Diltiazem if heart rate is less than 50 bmp Exercise regularly an d check heart rate when exercise
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
## 2013-07-20 ENCOUNTER — Encounter: Payer: Self-pay | Admitting: Cardiology

## 2013-07-20 NOTE — Assessment & Plan Note (Signed)
No further episodes of RVR since increasing the dose at night to 150 twice a day. He is currently on 240 mg of Diltiazem along with 50 mg of Toprol. I recommend that his heart rate is below 50 beats a minute, he hold the diltiazem. Anticoagulated with warfarin. Monitored by PCP. No bleeding concerns.

## 2013-07-20 NOTE — Assessment & Plan Note (Signed)
He is maintained the weight loss that he achieved back in September. He is has not lost any more weight.  Again discussed the importance of dietary modification and continued exercise.

## 2013-07-20 NOTE — Assessment & Plan Note (Signed)
No recurrent episodes since ablation

## 2013-07-20 NOTE — Assessment & Plan Note (Signed)
3-4 minutes of counseling about the importance of smoking cessation. It seems again to go in one year now the other. He is at least down to just a couple cigarettes a day

## 2013-07-20 NOTE — Assessment & Plan Note (Signed)
Actually doing relatively well as long as he is not in rapid A. fib. Well-preserved EF. Rate control with beta blocker and calcium blocker. Consider adding ACE inhibitor plus or minus shortness diuretic if this becomes a problem.

## 2013-07-20 NOTE — Assessment & Plan Note (Addendum)
Borderline control today. Right at the upper limit of what would be normal. He would probably need a different agent such as a diuretic or ACE inhibitor added if he were to continue to have elevated pressures.

## 2013-07-20 NOTE — Assessment & Plan Note (Signed)
Neither the CAD. He is on aspirin blocker and statin. Recommended smoking cessation with counseling provided today.

## 2013-07-21 ENCOUNTER — Emergency Department (HOSPITAL_COMMUNITY): Payer: Medicare Other

## 2013-07-21 ENCOUNTER — Telehealth: Payer: Self-pay | Admitting: Cardiology

## 2013-07-21 ENCOUNTER — Observation Stay (HOSPITAL_COMMUNITY)
Admission: EM | Admit: 2013-07-21 | Discharge: 2013-07-22 | Disposition: A | Discharge status: Home / Self Care | Payer: Medicare Other | Attending: Internal Medicine | Admitting: Internal Medicine

## 2013-07-21 ENCOUNTER — Encounter (HOSPITAL_COMMUNITY): Payer: Self-pay | Admitting: Emergency Medicine

## 2013-07-21 DIAGNOSIS — F17200 Nicotine dependence, unspecified, uncomplicated: Secondary | ICD-10-CM | POA: Insufficient documentation

## 2013-07-21 DIAGNOSIS — I5032 Chronic diastolic (congestive) heart failure: Secondary | ICD-10-CM | POA: Insufficient documentation

## 2013-07-21 DIAGNOSIS — I10 Essential (primary) hypertension: Secondary | ICD-10-CM | POA: Diagnosis present

## 2013-07-21 DIAGNOSIS — R072 Precordial pain: Principal | ICD-10-CM | POA: Diagnosis present

## 2013-07-21 DIAGNOSIS — J4489 Other specified chronic obstructive pulmonary disease: Secondary | ICD-10-CM | POA: Insufficient documentation

## 2013-07-21 DIAGNOSIS — E1165 Type 2 diabetes mellitus with hyperglycemia: Secondary | ICD-10-CM | POA: Insufficient documentation

## 2013-07-21 DIAGNOSIS — E785 Hyperlipidemia, unspecified: Secondary | ICD-10-CM | POA: Insufficient documentation

## 2013-07-21 DIAGNOSIS — Z9889 Other specified postprocedural states: Secondary | ICD-10-CM | POA: Insufficient documentation

## 2013-07-21 DIAGNOSIS — I2510 Atherosclerotic heart disease of native coronary artery without angina pectoris: Secondary | ICD-10-CM

## 2013-07-21 DIAGNOSIS — Z79899 Other long term (current) drug therapy: Secondary | ICD-10-CM | POA: Insufficient documentation

## 2013-07-21 DIAGNOSIS — I4891 Unspecified atrial fibrillation: Secondary | ICD-10-CM

## 2013-07-21 DIAGNOSIS — Z7982 Long term (current) use of aspirin: Secondary | ICD-10-CM | POA: Insufficient documentation

## 2013-07-21 DIAGNOSIS — Z794 Long term (current) use of insulin: Secondary | ICD-10-CM | POA: Insufficient documentation

## 2013-07-21 DIAGNOSIS — I4892 Unspecified atrial flutter: Secondary | ICD-10-CM

## 2013-07-21 DIAGNOSIS — K810 Acute cholecystitis: Secondary | ICD-10-CM | POA: Insufficient documentation

## 2013-07-21 DIAGNOSIS — IMO0002 Reserved for concepts with insufficient information to code with codable children: Secondary | ICD-10-CM | POA: Insufficient documentation

## 2013-07-21 DIAGNOSIS — Z7901 Long term (current) use of anticoagulants: Secondary | ICD-10-CM | POA: Insufficient documentation

## 2013-07-21 DIAGNOSIS — M5412 Radiculopathy, cervical region: Secondary | ICD-10-CM | POA: Insufficient documentation

## 2013-07-21 DIAGNOSIS — E118 Type 2 diabetes mellitus with unspecified complications: Secondary | ICD-10-CM

## 2013-07-21 DIAGNOSIS — J449 Chronic obstructive pulmonary disease, unspecified: Secondary | ICD-10-CM | POA: Insufficient documentation

## 2013-07-21 DIAGNOSIS — R079 Chest pain, unspecified: Secondary | ICD-10-CM

## 2013-07-21 DIAGNOSIS — Z86718 Personal history of other venous thrombosis and embolism: Secondary | ICD-10-CM | POA: Insufficient documentation

## 2013-07-21 DIAGNOSIS — I739 Peripheral vascular disease, unspecified: Secondary | ICD-10-CM | POA: Insufficient documentation

## 2013-07-21 LAB — TSH: TSH: 3.71 u[IU]/mL (ref 0.350–4.500)

## 2013-07-21 LAB — CBC
HEMATOCRIT: 38.6 % — AB (ref 39.0–52.0)
HEMOGLOBIN: 11.4 g/dL — AB (ref 13.0–17.0)
MCH: 22.2 pg — ABNORMAL LOW (ref 26.0–34.0)
MCHC: 29.5 g/dL — AB (ref 30.0–36.0)
MCV: 75.1 fL — ABNORMAL LOW (ref 78.0–100.0)
Platelets: 269 10*3/uL (ref 150–400)
RBC: 5.14 MIL/uL (ref 4.22–5.81)
RDW: 18.2 % — ABNORMAL HIGH (ref 11.5–15.5)
WBC: 8.8 10*3/uL (ref 4.0–10.5)

## 2013-07-21 LAB — BASIC METABOLIC PANEL
BUN: 15 mg/dL (ref 6–23)
CHLORIDE: 101 meq/L (ref 96–112)
CO2: 20 mEq/L (ref 19–32)
Calcium: 9 mg/dL (ref 8.4–10.5)
Creatinine, Ser: 0.88 mg/dL (ref 0.50–1.35)
GFR calc Af Amer: >90 mL/min (ref >90)
GFR calc non Af Amer: 83 mL/min — ABNORMAL LOW (ref >90)
GLUCOSE: 127 mg/dL — AB (ref 70–99)
Potassium: 4.7 mEq/L (ref 3.7–5.3)
Sodium: 139 mEq/L (ref 137–147)

## 2013-07-21 LAB — GLUCOSE, CAPILLARY: Glucose-Capillary: 121 mg/dL — ABNORMAL HIGH (ref 70–99)

## 2013-07-21 LAB — PROTIME-INR
INR: 1.91 — ABNORMAL HIGH (ref 0.00–1.49)
INR: 1.82 — ABNORMAL HIGH (ref 0.00–1.49)
PROTHROMBIN TIME: 21.3 s — AB (ref 11.6–15.2)
PROTHROMBIN TIME: 20.5 s — AB (ref 11.6–15.2)

## 2013-07-21 LAB — D-DIMER, QUANTITATIVE: D-Dimer, Quant: <0.27 ug/mL-FEU (ref 0.00–0.48)

## 2013-07-21 LAB — TROPONIN I: Troponin I: <0.3 ng/mL (ref <0.30)

## 2013-07-21 LAB — I-STAT TROPONIN, ED: Troponin i, poc: 0.01 ng/mL (ref 0.00–0.08)

## 2013-07-21 LAB — PRO B NATRIURETIC PEPTIDE: Pro B Natriuretic peptide (BNP): 228.4 pg/mL — ABNORMAL HIGH (ref 0–125)

## 2013-07-21 LAB — MAGNESIUM: MAGNESIUM: 1.9 mg/dL (ref 1.5–2.5)

## 2013-07-21 MED ORDER — INSULIN ASPART 100 UNIT/ML ~~LOC~~ SOLN
0.0000 [IU] | Freq: Three times a day (TID) | SUBCUTANEOUS | Status: DC
  Administered 2013-07-22: 2 [IU] via SUBCUTANEOUS

## 2013-07-21 MED ORDER — NITROGLYCERIN 0.4 MG SL SUBL: 0.4000 mg | SUBLINGUAL_TABLET | SUBLINGUAL | Status: DC | PRN

## 2013-07-21 MED ORDER — SODIUM CHLORIDE 0.9 % IJ SOLN
3.0000 mL | Freq: Two times a day (BID) | INTRAMUSCULAR | Status: DC
  Administered 2013-07-21 – 2013-07-22 (×2): 3 mL via INTRAVENOUS

## 2013-07-21 MED ORDER — SIMVASTATIN 20 MG PO TABS
20.0000 mg | ORAL_TABLET | Freq: Every day | ORAL | Status: DC
  Filled 2013-07-21: qty 1

## 2013-07-21 MED ORDER — ASPIRIN 81 MG PO CHEW
324.0000 mg | CHEWABLE_TABLET | ORAL | Status: AC
  Administered 2013-07-21: 324 mg via ORAL
  Filled 2013-07-21: qty 4

## 2013-07-21 MED ORDER — DILTIAZEM HCL ER COATED BEADS 240 MG PO CP24
240.0000 mg | ORAL_CAPSULE | ORAL | Status: DC
Start: 2013-07-22 — End: 2013-07-22
  Administered 2013-07-22: 240 mg via ORAL
  Filled 2013-07-21 (×2): qty 1

## 2013-07-21 MED ORDER — ASPIRIN 300 MG RE SUPP: 300.0000 mg | RECTAL | Status: AC

## 2013-07-21 MED ORDER — FLECAINIDE ACETATE 50 MG PO TABS
150.0000 mg | ORAL_TABLET | Freq: Two times a day (BID) | ORAL | Status: DC
  Administered 2013-07-22: 150 mg via ORAL
  Filled 2013-07-21 (×4): qty 1

## 2013-07-21 MED ORDER — SODIUM CHLORIDE 0.9 % IJ SOLN: 3.0000 mL | INTRAMUSCULAR | Status: DC | PRN

## 2013-07-21 MED ORDER — ASPIRIN EC 81 MG PO TBEC
81.0000 mg | DELAYED_RELEASE_TABLET | ORAL | Status: DC
Start: 2013-07-22 — End: 2013-07-22
  Administered 2013-07-22: 81 mg via ORAL
  Filled 2013-07-21 (×2): qty 1

## 2013-07-21 MED ORDER — LORATADINE 10 MG PO TABS
10.0000 mg | ORAL_TABLET | ORAL | Status: DC
  Administered 2013-07-22: 10 mg via ORAL
  Filled 2013-07-21 (×2): qty 1

## 2013-07-21 MED ORDER — INSULIN GLARGINE 100 UNIT/ML ~~LOC~~ SOLN
14.0000 [IU] | Freq: Two times a day (BID) | SUBCUTANEOUS | Status: DC
  Administered 2013-07-21 – 2013-07-22 (×2): 14 [IU] via SUBCUTANEOUS
  Filled 2013-07-21 (×3): qty 0.14

## 2013-07-21 MED ORDER — METOPROLOL SUCCINATE ER 100 MG PO TB24
100.0000 mg | ORAL_TABLET | Freq: Every day | ORAL | Status: DC
  Administered 2013-07-21: 100 mg via ORAL
  Filled 2013-07-21 (×2): qty 1

## 2013-07-21 MED ORDER — SODIUM CHLORIDE 0.9 % IV SOLN: 250.0000 mL | INTRAVENOUS | Status: DC | PRN

## 2013-07-21 MED ORDER — PRAVASTATIN SODIUM 40 MG PO TABS
40.0000 mg | ORAL_TABLET | Freq: Every day | ORAL | Status: DC
  Administered 2013-07-21 – 2013-07-22 (×2): 40 mg via ORAL
  Filled 2013-07-21 (×2): qty 1

## 2013-07-21 NOTE — ED Notes (Signed)
Pt reports intermittent left sided CP since Saturday, also feeling weak and faint on and off. States Bilateral arm pain which is chronic. Has been checking HR and BP since Friday, was told not to take cardizem if HR < 50. Pt reports he feels fine now but has been feeling bad prior. Pt is a x 4. Was nauseated on the drive over. Family at bedside.

## 2013-07-21 NOTE — ED Notes (Signed)
Family at bedside. 

## 2013-07-21 NOTE — ED Notes (Signed)
Diet tray ordered 

## 2013-07-21 NOTE — Telephone Encounter (Signed)
Pt in ER now.
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
## 2013-07-21 NOTE — Telephone Encounter (Signed)
It does not sound like the dizziness is related to Afib or to orthostatic hypotension.   Not sure what to make of the CP -- unlikely cardiac though.    I agree with recommendations.  If dizziness continues - would check HR.  If stable -- consider checking with PCP.  Leonie Man, MD
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
## 2013-07-21 NOTE — Telephone Encounter (Signed)
Pt feeling dizzy,faint and weak.. Blood pressure was 166/78. Pt need to be seen asap.Should she just take hi to the ER?

## 2013-07-21 NOTE — Progress Notes (Signed)
ANTICOAGULATION CONSULT NOTE - Initial Consult  Pharmacy Consult for warfarin Indication: atrial fibrillation  No Known Allergies    Vital Signs: Temp: 98.1 F (36.7 C) (04/27 1744) Temp src: Oral (04/27 1744) BP: 122/62 mmHg (04/27 1815) Pulse Rate: 81 (04/27 1815)  Labs:  Recent Labs  07/21/13 1448  HGB 11.4*  HCT 38.6*  PLT 269  LABPROT 21.3*  INR 1.91*  CREATININE 0.88    The CrCl is unknown because both a height and weight (above a minimum accepted value) are required for this calculation.   Medical History: Past Medical History  Diagnosis Date  . Atrial flutter 08/05/2010    Status post caval tricuspid isthmus ablation by Dr. [name]  . PAF (paroxysmal atrial fibrillation) 2012    Recurrent after atrial flutter, currently controlled on flecainide plus diltiazem  . Diabetes mellitus type II     On metformin and insulin  . Hyperlipidemia   . HTN (hypertension)   . Diastolic CHF, chronic     preserved EF 55%; exacerbated by A. fib/A. flutter  . Coronary artery disease, non-occlusive January 2012    Nonocclusive disease by cath, performed for ST elevations on ECG.  . History of DVT (deep vein thrombosis)     on warfarin  . Tobacco abuse     Initially quit, now back to smoking.  [name] COPD (chronic obstructive pulmonary disease)   . Cholecystitis, acute January 2012    Status post cholecystectomy preceded by T-tube,   . Cervical radiculopathy due to degenerative joint disease of spine   . Claudication     lower ext dopplers 08/18/11-normal ABIs bilaterally with normal triphasic waveforms    Medications:  Prescriptions prior to admission  Medication Sig Dispense Refill  . aspirin EC 81 MG tablet Take 81 mg by mouth every morning.      . diltiazem (CARDIZEM CD) 240 MG 24 hr capsule Take 240 mg by mouth every morning.      . flecainide (TAMBOCOR) 150 MG tablet Take 150 mg by mouth 2 (two) times daily.      . insulin glargine (LANTUS SOLOSTAR) 100 UNIT/ML  injection Inject 14 Units into the skin 2 (two) times daily.       [name] loratadine (CLARITIN) 10 MG tablet Take 10 mg by mouth every morning.       . metFORMIN (GLUCOPHAGE) 1000 MG tablet Take 1,000 mg by mouth 2 (two) times daily with a meal.       . metoprolol (TOPROL-XL) 50 MG 24 hr tablet Take 100 mg by mouth at bedtime.       . nitroGLYCERIN (NITROSTAT) 0.4 MG SL tablet Place 0.4 mg under the tongue every 5 (five) minutes x 3 doses as needed. For chest pain.      . pravastatin (PRAVACHOL) 40 MG tablet Take 1 tablet (40 mg total) by mouth every evening.  90 tablet  3  . warfarin (COUMADIN) 5 MG tablet Take 5-7.5 mg by mouth daily. Take 5 mg on all days including Sunday. ONLY Take 7.5 mg on Monday & Friday        Assessment: 73 year old man on warfarin for AFib to continue while hospitalized for chest pain workup.  INR slightly below goal at 1.91 Goal of Therapy:  INR 2-3    Plan:  Patient already took dose today.  As INR only slightly below goal, will not redose increase dose at this time. Will check daily protimes.  Gearldine Bienenstock Henderson Frampton 07/21/2013,7:05 PM

## 2013-07-21 NOTE — Telephone Encounter (Signed)
Returned call and pt verified x 2 w/ [name], pt's daughter.  Stated pt saw Dr. [name] Friday.  Stated pt c/o feeling dizzy and weak and has had intermittent L CP (over to armpit) w/ intermittent SOB.  Stated pt said the dizziness and weakness comes when he stands.  Asked daughter to check BP now: (sitting) 159/75 87 (standing) 168/78 93.   Advised pt rest and Dr. [name] will be notified for further instructions when he comes in at 3pm since pt is asymptomatic right now.  Also advised pt take NTG if CP returns as that is what is rx'd for.  If CP lasts > 30 mins, then ER immediately.  Verbalized understanding and agreed w/ plan.  Message forwarded to Dr. [name] who mentioned in note that BP meds may need to be adjusted if BP continued to be elevated.

## 2013-07-22 ENCOUNTER — Observation Stay (HOSPITAL_COMMUNITY): Payer: Medicare Other

## 2013-07-22 ENCOUNTER — Encounter (HOSPITAL_COMMUNITY): Payer: Self-pay | Admitting: Cardiology

## 2013-07-22 DIAGNOSIS — R079 Chest pain, unspecified: Secondary | ICD-10-CM

## 2013-07-22 DIAGNOSIS — E118 Type 2 diabetes mellitus with unspecified complications: Secondary | ICD-10-CM

## 2013-07-22 DIAGNOSIS — IMO0002 Reserved for concepts with insufficient information to code with codable children: Secondary | ICD-10-CM

## 2013-07-22 DIAGNOSIS — E1165 Type 2 diabetes mellitus with hyperglycemia: Secondary | ICD-10-CM

## 2013-07-22 HISTORY — PX: NM MYOVIEW LTD: HXRAD82

## 2013-07-22 LAB — COMPREHENSIVE METABOLIC PANEL
ALT: 21 U/L (ref 0–53)
AST: 21 U/L (ref 0–37)
Albumin: 3.8 g/dL (ref 3.5–5.2)
Alkaline Phosphatase: 85 U/L (ref 39–117)
BUN: 16 mg/dL (ref 6–23)
CO2: 25 mEq/L (ref 19–32)
Calcium: 9.2 mg/dL (ref 8.4–10.5)
Chloride: 101 mEq/L (ref 96–112)
Creatinine, Ser: 1 mg/dL (ref 0.50–1.35)
GFR calc Af Amer: 83 mL/min — ABNORMAL LOW (ref >90)
GFR calc non Af Amer: 72 mL/min — ABNORMAL LOW (ref >90)
Glucose, Bld: 128 mg/dL — ABNORMAL HIGH (ref 70–99)
Potassium: 4.8 mEq/L (ref 3.7–5.3)
Sodium: 140 mEq/L (ref 137–147)
Total Bilirubin: <0.2 mg/dL — ABNORMAL LOW (ref 0.3–1.2)
Total Protein: 7.3 g/dL (ref 6.0–8.3)

## 2013-07-22 LAB — GLUCOSE, CAPILLARY
Glucose-Capillary: 123 mg/dL — ABNORMAL HIGH (ref 70–99)
Glucose-Capillary: 109 mg/dL — ABNORMAL HIGH (ref 70–99)
Glucose-Capillary: 159 mg/dL — ABNORMAL HIGH (ref 70–99)
Glucose-Capillary: 125 mg/dL — ABNORMAL HIGH (ref 70–99)

## 2013-07-22 LAB — CBC
HCT: 40.2 % (ref 39.0–52.0)
Hemoglobin: 11.9 g/dL — ABNORMAL LOW (ref 13.0–17.0)
MCH: 22.2 pg — AB (ref 26.0–34.0)
MCHC: 29.6 g/dL — ABNORMAL LOW (ref 30.0–36.0)
MCV: 75 fL — AB (ref 78.0–100.0)
PLATELETS: 263 10*3/uL (ref 150–400)
RBC: 5.36 MIL/uL (ref 4.22–5.81)
RDW: 18.5 % — ABNORMAL HIGH (ref 11.5–15.5)
WBC: 6.8 10*3/uL (ref 4.0–10.5)

## 2013-07-22 LAB — PROTIME-INR
INR: 1.6 — ABNORMAL HIGH (ref 0.00–1.49)
PROTHROMBIN TIME: 18.6 s — AB (ref 11.6–15.2)

## 2013-07-22 LAB — LIPID PANEL
CHOL/HDL RATIO: 4.2 ratio
Cholesterol: 155 mg/dL (ref 0–200)
HDL: 37 mg/dL — ABNORMAL LOW (ref >39)
LDL Cholesterol: 72 mg/dL (ref 0–99)
Triglycerides: 229 mg/dL — ABNORMAL HIGH (ref <150)
VLDL: 46 mg/dL — AB (ref 0–40)

## 2013-07-22 LAB — TROPONIN I: Troponin I: <0.3 ng/mL (ref <0.30)

## 2013-07-22 MED ORDER — TRAMADOL HCL 50 MG PO TABS: 50.0000 mg | ORAL_TABLET | Freq: Four times a day (QID) | ORAL | Status: DC | PRN

## 2013-07-22 MED ORDER — REGADENOSON 0.4 MG/5ML IV SOLN
INTRAVENOUS | Status: AC
  Administered 2013-07-22: 0.4 mg via INTRAVENOUS
  Filled 2013-07-22: qty 5

## 2013-07-22 MED ORDER — TECHNETIUM TC 99M SESTAMIBI GENERIC - CARDIOLITE
10.0000 | Freq: Once | INTRAVENOUS | Status: AC | PRN
  Administered 2013-07-22: 10 via INTRAVENOUS

## 2013-07-22 MED ORDER — ENOXAPARIN SODIUM 100 MG/ML ~~LOC~~ SOLN
90.0000 mg | Freq: Two times a day (BID) | SUBCUTANEOUS | Status: DC
  Administered 2013-07-22: 90 mg via SUBCUTANEOUS
  Filled 2013-07-22 (×2): qty 1

## 2013-07-22 MED ORDER — TECHNETIUM TC 99M SESTAMIBI GENERIC - CARDIOLITE
30.0000 | Freq: Once | INTRAVENOUS | Status: AC | PRN
  Administered 2013-07-22: 30 via INTRAVENOUS

## 2013-07-22 MED ORDER — REGADENOSON 0.4 MG/5ML IV SOLN
0.4000 mg | Freq: Once | INTRAVENOUS | Status: AC
Start: 2013-07-22 — End: 2013-07-22
  Administered 2013-07-22: 0.4 mg via INTRAVENOUS
  Filled 2013-07-22: qty 5

## 2013-07-22 MED ORDER — WARFARIN SODIUM 7.5 MG PO TABS
7.5000 mg | ORAL_TABLET | Freq: Once | ORAL | Status: AC
  Administered 2013-07-22: 7.5 mg via ORAL
  Filled 2013-07-22: qty 1

## 2013-07-22 MED ORDER — WARFARIN - PHARMACIST DOSING INPATIENT: Freq: Every day | Status: DC

## 2013-07-22 NOTE — Discharge Summary (Signed)
CARDIOLOGY DISCHARGE SUMMARY   Patient ID: [name] MRN: [id number] DOB/AGE: 11/09/1940 73 y.o.  Admit date: 07/21/2013 Discharge date: 07/22/2013  PCP: [name], MD Primary Cardiologist: Dr. [name]  Primary Discharge Diagnosis:  Precordial chest pain Secondary Discharge Diagnosis:  Chronic anticoagulation with Coumadin Diabetes Hypertension Hyperlipidemia  Procedures: Lexi scan [facility] Course: [name] is a 73 y.o. male with a history of CAD. He had intermittent left-sided chest pain it was not associated with activity. He came to the hospital when he was awakened by the pain and was concerned. He was admitted for further evaluation and treatment.  His cardiac enzymes were negative for MI. Once his enzymes are negative, he was stable for a Lexi scan Cardiolite which was performed on 04/28, results below. He was started on pain control medications for her symptoms and they improved. His diabetes was controlled with a combination of his home medications and sliding scale insulin. He was continued on his home blood pressure medications and his blood pressure was under adequate control. He has a history of CHF but oxygen saturation was 94% or more on room air. He has a history of hyperlipidemia. A lipid profile was performed, results below. No medication changes were made.  On 04/28, he was seen by Dr. [name] and all data were reviewed. No further inpatient workup is indicated and he is considered stable for discharge, to follow up as an outpatient.  Labs:   Lab Results  Component Value Date   WBC 6.8 07/22/2013   HGB 11.9* 07/22/2013   HCT 40.2 07/22/2013   MCV 75.0* 07/22/2013   PLT 263 07/22/2013     Recent Labs Lab 07/22/13 0750  NA 140  K 4.8  CL 101  CO2 25  BUN 16  CREATININE 1.00  CALCIUM 9.2  PROT 7.3  BILITOT <0.2*  ALKPHOS 85  ALT 21  AST 21  GLUCOSE 128*    Recent Labs  07/21/13 2013 07/22/13 0028 07/22/13 0750    TROPONINI <0.30 <0.30 <0.30   Lipid Panel     Component Value Date/Time   CHOL 155 07/22/2013 0750   TRIG 229* 07/22/2013 0750   HDL 37* 07/22/2013 0750   CHOLHDL 4.2 07/22/2013 0750   VLDL 46* 07/22/2013 0750   LDLCALC 72 07/22/2013 0750    Pro B Natriuretic peptide (BNP)  Date/Time Value Ref Range Status  07/21/2013  8:13 PM 228.4* 0 - 125 pg/mL Final  01/22/2013  7:16 PM 265.1* 0 - 125 pg/mL Final    Recent Labs  07/22/13 0750  INR 1.60*      Radiology: 07/22/2013 Lexiscan Nuclear stress test IMPRESSION: Exercise Capacity: [name] BP Response: normal Clinical Symptoms: No symptoms ECG Impression: No lexiscan EKG changes Comparison with Prior Nuclear Study: none Final Impression: Normal perfusion. LVEF 60% - normal wall motion.   Dg Chest 2 View 07/21/2013   CLINICAL DATA:  Chest pain, shortness of breath.  EXAM: CHEST  2 VIEW  COMPARISON:  April 16, 2013.  FINDINGS: The heart size and mediastinal contours are within normal limits. Both lungs are clear. No pneumothorax or pleural effusion is noted. The visualized skeletal structures are unremarkable.  IMPRESSION: No acute cardiopulmonary abnormality seen.   Electronically Signed   By: Sabino Dick M.D.   On: 07/21/2013 16:09    EKG: 22-Jul-2013 04:43:01   Sinus bradycardia with 1st degree A-V block Left axis deviation Vent. rate 59 BPM PR interval 300 ms QRS duration 90 ms QT/QTc  424/419 ms P-R-T axes 73 -43 65   FOLLOW UP PLANS AND APPOINTMENTS No Known Allergies   Medication List         aspirin EC 81 MG tablet  Take 81 mg by mouth every morning.     diltiazem 240 MG 24 hr capsule  Commonly known as:  CARDIZEM CD  Take 240 mg by mouth every morning.     flecainide 150 MG tablet  Commonly known as:  TAMBOCOR  Take 150 mg by mouth 2 (two) times daily.     LANTUS SOLOSTAR 100 UNIT/ML injection  Generic drug:  insulin glargine  Inject 14 Units into the skin 2 (two) times daily.     loratadine 10 MG  tablet  Commonly known as:  CLARITIN  Take 10 mg by mouth every morning.     metFORMIN 1000 MG tablet  Commonly known as:  GLUCOPHAGE  Take 1,000 mg by mouth 2 (two) times daily with a meal.     metoprolol succinate 50 MG 24 hr tablet  Commonly known as:  TOPROL-XL  Take 100 mg by mouth at bedtime.     nitroGLYCERIN 0.4 MG SL tablet  Commonly known as:  NITROSTAT  Place 0.4 mg under the tongue every 5 (five) minutes x 3 doses as needed. For chest pain.     pravastatin 40 MG tablet  Commonly known as:  PRAVACHOL  Take 1 tablet (40 mg total) by mouth every evening.     traMADol 50 MG tablet  Commonly known as:  ULTRAM  Take 1-2 tablets (50-100 mg total) by mouth every 6 (six) hours as needed for moderate pain.     warfarin 5 MG tablet  Commonly known as:  COUMADIN  Take 5-7.5 mg by mouth daily. Take 5 mg on all days including Sunday. ONLY Take 7.5 mg on Monday & Friday        Discharge Orders   Future Appointments Provider Department Dept Phone   08/04/2013 10:00 AM Bryan Hager, [place] [phone]   08/15/2013 10:10 AM Tommy Medal, RPH-CPP [facility] [phone]   01/23/2014 9:15 AM Hayden Pedro, MD [place] [phone]   Future Orders Complete By Expires   Diet - low sodium heart healthy  As directed    Diet Carb Modified  As directed    Increase activity slowly  As directed      Follow-up Information   Follow up with HAGER, BRYAN, PA-C On 08/04/2013. (See for Dr. [name] at 10:00 am)    Specialty:  Physician Assistant   Contact information:   [address] [place] [address] [place] [address] [place]  Time spent with patient to include physician time: 41 min Signed: Lonn Georgia, PA-C 07/22/2013, 4:39 PM Co-Sign MD  I have seen and evaluated the patient this PM along with Lonn Georgia , PA-C. I agree with her  findings, examination as well as impression recommendations.  Atypical CP & no further Afib. CP was L flank --no longer present.  Mild dizziness - ? Related to lo BP. If recurrent, will back down BB dose.  Negative / non-ischemic Myoview results - ready for d/c this PM. .    Leonie Man, M.D., M.S.  Interventional Cardiologist  [place]  Pager # [phone]  07/22/2013

## 2013-07-22 NOTE — Progress Notes (Signed)
UR Completed.  [name] [name] [phone] 07/22/2013  

## 2013-07-22 NOTE — Progress Notes (Signed)
Patient Name: [name] Date of Encounter: 07/22/2013  Active Problems:   Chest pain    Patient Profile: 73 yo male w/ hx HPpEF, non-obs CAD, aflutter ablation, afib, HTN, obesity who was admitted 04/27 w/ chest pain.  SUBJECTIVE: Symptoms have improved, mild tenderness lower lateral ribs.  OBJECTIVE Filed Vitals:   07/21/13 1800 07/21/13 1815 07/21/13 2030 07/22/13 0606  BP: 113/54 122/62 129/60 97/57  Pulse: 79 81 77 61  Temp:   97.7 F (36.5 C) 97.6 F (36.4 C)  TempSrc:    Oral  Resp: 14 23 18 18   Height:   5\' 6"  (1.676 m)   Weight:   204 lb 4.8 oz (92.67 kg) 204 lb 4.8 oz (92.67 kg)  SpO2: 98% 97% 95% 94%    Intake/Output Summary (Last 24 hours) at 07/22/13 4268 Last data filed at 07/21/13 2100  Gross per 24 hour  Intake    483 ml  Output      0 ml  Net    483 ml   Filed Weights   07/21/13 2030 07/22/13 0606  Weight: 204 lb 4.8 oz (92.67 kg) 204 lb 4.8 oz (92.67 kg)    PHYSICAL EXAM General: Well developed, well nourished, male in no acute distress. Head: Normocephalic, atraumatic.  Neck: Supple without bruits, JVD not elevated. Lungs:  Resp regular and unlabored, few rales bases Heart: RRR, S1, S2, no S3, S4, or murmur; no rub. Abdomen: Soft, non-tender, non-distended, BS + x 4.  Extremities: No clubbing, cyanosis, no edema.  Neuro: Alert and oriented X 3. Moves all extremities spontaneously. Psych: Normal affect.  LABS: CBC: Recent Labs  07/21/13 1448 07/22/13 0750  WBC 8.8 6.8  HGB 11.4* 11.9*  HCT 38.6* 40.2  MCV 75.1* 75.0*  PLT 269 263   INR: Recent Labs  07/22/13 0750  INR 3.41*   Basic Metabolic Panel: Recent Labs  07/21/13 1448 07/21/13 2013 07/22/13 0750  NA 139  --  140  K 4.7  --  4.8  CL 101  --  101  CO2 20  --  25  GLUCOSE 127*  --  128*  BUN 15  --  16  CREATININE 0.88  --  1.00  CALCIUM 9.0  --  9.2  MG  --  1.9  --    Liver Function Tests: Recent Labs  07/22/13 0750  AST 21  ALT 21  ALKPHOS  85  BILITOT <0.2*  PROT 7.3  ALBUMIN 3.8   Cardiac Enzymes: Recent Labs  07/21/13 2013 07/22/13 0028 07/22/13 0750  TROPONINI <0.30 <0.30 <0.30    Recent Labs  07/21/13 1458  TROPIPOC 0.01   BNP: Pro B Natriuretic peptide (BNP)  Date/Time Value Ref Range Status  07/21/2013  8:13 PM 228.4* 0 - 125 pg/mL Final  01/22/2013  7:16 PM 265.1* 0 - 125 pg/mL Final   Lab Results  Component Value Date   INR 1.60* 07/22/2013   INR 1.82* 07/21/2013   INR 1.91* 07/21/2013   D-dimer: Recent Labs  07/21/13 1448  DDIMER <0.27   Fasting Lipid Panel: Recent Labs  07/22/13 0750  CHOL 155  HDL 37*  LDLCALC 72  TRIG 229*  CHOLHDL 4.2   Thyroid Function Tests: Recent Labs  07/21/13 2013  TSH 3.710   TELE:  SR, seen in nuc med     Radiology/Studies: Dg Chest 2 View 07/21/2013   CLINICAL DATA:  Chest pain, shortness of breath.  EXAM: CHEST  2 VIEW  COMPARISON:  April 16, 2013.  FINDINGS: The heart size and mediastinal contours are within normal limits. Both lungs are clear. No pneumothorax or pleural effusion is noted. The visualized skeletal structures are unremarkable.  IMPRESSION: No acute cardiopulmonary abnormality seen.   Electronically Signed   By: Sabino Dick M.D.   On: 07/21/2013 16:09   Current Medications:  . aspirin EC  81 mg Oral BH-q7a  . diltiazem  240 mg Oral BH-q7a  . flecainide  150 mg Oral Q12H  . insulin aspart  0-15 Units Subcutaneous TID WC  . insulin glargine  14 Units Subcutaneous BID  . loratadine  10 mg Oral BH-q7a  . metoprolol succinate  100 mg Oral QHS  . pravastatin  40 mg Oral q1800  . regadenoson      . regadenoson  0.4 mg Intravenous Once  . sodium chloride  3 mL Intravenous Q12H   ASSESSMENT AND PLAN: Active Problems:   Chest pain - enzymes negative for MI, maintaining SR. For Lexiscan today, no further cardiology eval if negative. Add Tramadol PRN, can d/c on this if helpful.    DM - got Lantus last pm, check CBG since he is NPO.      Hx afib/flutter - maintaining SR    Chronic anticoagulation - coumadin held, but was subtherapeutic, give Lovenox   Plan - d/c later today if does well.  Signed, Lonn Georgia , PA-C  9:39 AM 07/22/2013   I have seen and evaluated the patient this PM along with Evelene Croon Barrett , PA-C. I agree with her findings, examination as well as impression recommendations.  Atypical CP & no further Afib.  CP was L flank --no longer present.  Mild dizziness - ? Related to lo BP.  If recurrent, will back down BB dose.  Awaiting Myoview results - if non-ischemic, d/c this PM.   Leonie Man, M.D., M.S. Interventional Cardiologist  [place] Pager # [phone] 07/22/2013

## 2013-07-22 NOTE — Progress Notes (Signed)
ANTICOAGULATION CONSULT NOTE - Initial Consult  Pharmacy Consult for warfarin/lovenox Indication: atrial fibrillation  No Known Allergies  Vital Signs: Temp: 97.6 F (36.4 C) (04/28 0606) Temp src: Oral (04/28 0606) BP: 97/57 mmHg (04/28 0606) Pulse Rate: 61 (04/28 0606)  Labs:  Recent Labs  07/21/13 1448 07/21/13 2013 07/22/13 0028 07/22/13 0750  HGB 11.4*  --   --  11.9*  HCT 38.6*  --   --  40.2  PLT 269  --   --  263  LABPROT 21.3* 20.5*  --  18.6*  INR 1.91* 1.82*  --  1.60*  CREATININE 0.88  --   --  1.00  TROPONINI  --  <0.30 <0.30 <0.30    Estimated Creatinine Clearance: 69.1 ml/min (by C-G formula based on Cr of 1).   Medical History: Past Medical History  Diagnosis Date  . Atrial flutter 08/05/2010    Status post caval tricuspid isthmus ablation by Dr. [name]  . PAF (paroxysmal atrial fibrillation) 2012    Recurrent after atrial flutter, currently controlled on flecainide plus diltiazem  . Diabetes mellitus type II     On metformin and insulin  . Hyperlipidemia   . HTN (hypertension)   . Diastolic CHF, chronic     preserved EF 55%; exacerbated by A. fib/A. flutter  . Coronary artery disease, non-occlusive January 2012    Nonocclusive disease by cath, performed for ST elevations on ECG.  . History of DVT (deep vein thrombosis)     on warfarin  . Tobacco abuse     Initially quit, now back to smoking.  [name] COPD (chronic obstructive pulmonary disease)   . Cholecystitis, acute January 2012    Status post cholecystectomy preceded by T-tube,   . Cervical radiculopathy due to degenerative joint disease of spine   . Claudication     lower ext dopplers 08/18/11-normal ABIs bilaterally with normal triphasic waveforms    Medications:  Prescriptions prior to admission  Medication Sig Dispense Refill  . aspirin EC 81 MG tablet Take 81 mg by mouth every morning.      . diltiazem (CARDIZEM CD) 240 MG 24 hr capsule Take 240 mg by mouth every morning.      .  flecainide (TAMBOCOR) 150 MG tablet Take 150 mg by mouth 2 (two) times daily.      . insulin glargine (LANTUS SOLOSTAR) 100 UNIT/ML injection Inject 14 Units into the skin 2 (two) times daily.       [name] loratadine (CLARITIN) 10 MG tablet Take 10 mg by mouth every morning.       . metFORMIN (GLUCOPHAGE) 1000 MG tablet Take 1,000 mg by mouth 2 (two) times daily with a meal.       . metoprolol (TOPROL-XL) 50 MG 24 hr tablet Take 100 mg by mouth at bedtime.       . nitroGLYCERIN (NITROSTAT) 0.4 MG SL tablet Place 0.4 mg under the tongue every 5 (five) minutes x 3 doses as needed. For chest pain.      . pravastatin (PRAVACHOL) 40 MG tablet Take 1 tablet (40 mg total) by mouth every evening.  90 tablet  3  . warfarin (COUMADIN) 5 MG tablet Take 5-7.5 mg by mouth daily. Take 5 mg on all days including Sunday. ONLY Take 7.5 mg on Monday & Friday        Assessment: 73 year old man on warfarin for AFib to continue while hospitalized for chest pain workup.  INR down to 1.6 this morning,  pharmacy is asked to start lovenox for bridging. Hgb 11.9, plt 263K stable, est. crcl ~ 70 ml/min. S/p Lexiscan, might go home today if normal  PTA dose: 7.5 mg on Monday & Friday, 5 mg on all other days. Last PTA dose on 4/27  Goal of Therapy:  INR 2-3    Plan:  Coumadin 7.5 mg today F/u Daily INR If goes home today, ok to resume home dose. Lovneox 90mg  sq Q 12 hrs for now. D/c once INR > 2, if goes home, do not need to bridge.  Maryanna Shape, PharmD, BCPS  Clinical Pharmacist  Pager: [phone]   07/22/2013,10:02 AM

## 2013-07-22 NOTE — Progress Notes (Signed)
DC orders received.  Patient stable with no S/S of distress.  Medication and discharge information reviewed with patient.  Patient DC home. [name]  

## 2013-07-22 NOTE — Progress Notes (Signed)
Lexiscan CL performed 

## 2013-08-01 ENCOUNTER — Emergency Department (HOSPITAL_COMMUNITY)
Admission: EM | Admit: 2013-08-01 | Discharge: 2013-08-01 | Disposition: A | Discharge status: Home / Self Care | Payer: Medicare Other | Attending: Emergency Medicine | Admitting: Emergency Medicine

## 2013-08-01 ENCOUNTER — Emergency Department (HOSPITAL_COMMUNITY): Payer: Medicare Other

## 2013-08-01 ENCOUNTER — Encounter (HOSPITAL_COMMUNITY): Payer: Self-pay | Admitting: Emergency Medicine

## 2013-08-01 DIAGNOSIS — I4891 Unspecified atrial fibrillation: Secondary | ICD-10-CM | POA: Insufficient documentation

## 2013-08-01 DIAGNOSIS — E119 Type 2 diabetes mellitus without complications: Secondary | ICD-10-CM | POA: Insufficient documentation

## 2013-08-01 DIAGNOSIS — S20219A Contusion of unspecified front wall of thorax, initial encounter: Secondary | ICD-10-CM | POA: Insufficient documentation

## 2013-08-01 DIAGNOSIS — S3981XA Other specified injuries of abdomen, initial encounter: Secondary | ICD-10-CM | POA: Insufficient documentation

## 2013-08-01 DIAGNOSIS — Y9389 Activity, other specified: Secondary | ICD-10-CM | POA: Insufficient documentation

## 2013-08-01 DIAGNOSIS — R109 Unspecified abdominal pain: Secondary | ICD-10-CM | POA: Insufficient documentation

## 2013-08-01 DIAGNOSIS — Z86718 Personal history of other venous thrombosis and embolism: Secondary | ICD-10-CM | POA: Insufficient documentation

## 2013-08-01 DIAGNOSIS — Z794 Long term (current) use of insulin: Secondary | ICD-10-CM | POA: Insufficient documentation

## 2013-08-01 DIAGNOSIS — J4489 Other specified chronic obstructive pulmonary disease: Secondary | ICD-10-CM | POA: Insufficient documentation

## 2013-08-01 DIAGNOSIS — F17200 Nicotine dependence, unspecified, uncomplicated: Secondary | ICD-10-CM | POA: Insufficient documentation

## 2013-08-01 DIAGNOSIS — I2510 Atherosclerotic heart disease of native coronary artery without angina pectoris: Secondary | ICD-10-CM | POA: Insufficient documentation

## 2013-08-01 DIAGNOSIS — J449 Chronic obstructive pulmonary disease, unspecified: Secondary | ICD-10-CM | POA: Insufficient documentation

## 2013-08-01 DIAGNOSIS — Z9889 Other specified postprocedural states: Secondary | ICD-10-CM | POA: Insufficient documentation

## 2013-08-01 DIAGNOSIS — Z79899 Other long term (current) drug therapy: Secondary | ICD-10-CM | POA: Insufficient documentation

## 2013-08-01 DIAGNOSIS — Y929 Unspecified place or not applicable: Secondary | ICD-10-CM | POA: Insufficient documentation

## 2013-08-01 DIAGNOSIS — Z8739 Personal history of other diseases of the musculoskeletal system and connective tissue: Secondary | ICD-10-CM | POA: Insufficient documentation

## 2013-08-01 DIAGNOSIS — N390 Urinary tract infection, site not specified: Secondary | ICD-10-CM | POA: Insufficient documentation

## 2013-08-01 DIAGNOSIS — E785 Hyperlipidemia, unspecified: Secondary | ICD-10-CM | POA: Insufficient documentation

## 2013-08-01 DIAGNOSIS — I5032 Chronic diastolic (congestive) heart failure: Secondary | ICD-10-CM | POA: Insufficient documentation

## 2013-08-01 DIAGNOSIS — W1809XA Striking against other object with subsequent fall, initial encounter: Secondary | ICD-10-CM | POA: Insufficient documentation

## 2013-08-01 DIAGNOSIS — I10 Essential (primary) hypertension: Secondary | ICD-10-CM | POA: Insufficient documentation

## 2013-08-01 DIAGNOSIS — Z7901 Long term (current) use of anticoagulants: Secondary | ICD-10-CM | POA: Insufficient documentation

## 2013-08-01 LAB — URINALYSIS, ROUTINE W REFLEX MICROSCOPIC
BILIRUBIN URINE: NEGATIVE
Glucose, UA: NEGATIVE mg/dL
HGB URINE DIPSTICK: NEGATIVE
KETONES UR: NEGATIVE mg/dL
NITRITE: NEGATIVE
PROTEIN: NEGATIVE mg/dL
Specific Gravity, Urine: 1.016 (ref 1.005–1.030)
UROBILINOGEN UA: 0.2 mg/dL (ref 0.0–1.0)
pH: 5.5 (ref 5.0–8.0)

## 2013-08-01 LAB — CBC
HEMATOCRIT: 40.4 % (ref 39.0–52.0)
HEMOGLOBIN: 12 g/dL — AB (ref 13.0–17.0)
MCH: 23.2 pg — AB (ref 26.0–34.0)
MCHC: 29.7 g/dL — AB (ref 30.0–36.0)
MCV: 78.1 fL (ref 78.0–100.0)
Platelets: 261 10*3/uL (ref 150–400)
RBC: 5.17 MIL/uL (ref 4.22–5.81)
RDW: 19.9 % — ABNORMAL HIGH (ref 11.5–15.5)
WBC: 7.1 10*3/uL (ref 4.0–10.5)

## 2013-08-01 LAB — BASIC METABOLIC PANEL
BUN: 11 mg/dL (ref 6–23)
CALCIUM: 9.5 mg/dL (ref 8.4–10.5)
CO2: 25 mEq/L (ref 19–32)
CREATININE: 1 mg/dL (ref 0.50–1.35)
Chloride: 100 mEq/L (ref 96–112)
GFR calc non Af Amer: 72 mL/min — ABNORMAL LOW (ref >90)
GFR, EST AFRICAN AMERICAN: 83 mL/min — AB (ref >90)
GLUCOSE: 165 mg/dL — AB (ref 70–99)
Potassium: 4.8 mEq/L (ref 3.7–5.3)
Sodium: 138 mEq/L (ref 137–147)

## 2013-08-01 LAB — PROTIME-INR
INR: 1.72 — AB (ref 0.00–1.49)
Prothrombin Time: 19.7 seconds — ABNORMAL HIGH (ref 11.6–15.2)

## 2013-08-01 LAB — URINE MICROSCOPIC-ADD ON

## 2013-08-01 MED ORDER — CEFAZOLIN SODIUM 1-5 GM-% IV SOLN
1.0000 g | Freq: Once | INTRAVENOUS | Status: DC
Start: 2013-08-01 — End: 2013-08-01

## 2013-08-01 MED ORDER — HYDROMORPHONE HCL PF 1 MG/ML IJ SOLN
0.5000 mg | Freq: Once | INTRAMUSCULAR | Status: AC
  Administered 2013-08-01: 0.5 mg via INTRAVENOUS
  Filled 2013-08-01: qty 1

## 2013-08-01 MED ORDER — OXYCODONE-ACETAMINOPHEN 5-325 MG PO TABS: 2.0000 | ORAL_TABLET | Freq: Three times a day (TID) | ORAL | Status: DC | PRN

## 2013-08-01 MED ORDER — CEPHALEXIN 500 MG PO CAPS: ORAL_CAPSULE | ORAL | Status: DC

## 2013-08-01 MED ORDER — IOHEXOL 300 MG/ML  SOLN
100.0000 mL | Freq: Once | INTRAMUSCULAR | Status: AC | PRN
  Administered 2013-08-01: 100 mL via INTRAVENOUS

## 2013-08-01 NOTE — ED Notes (Addendum)
Per pt sts he fell last Sunday while carrying some groceries in the house. Pt has bruising to left chest/rib area where he fell on a can. sts right hip pain. sts he has been weak all week.
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
## 2013-08-01 NOTE — ED Notes (Signed)
EDP ordered patient able to eat and drink.

## 2013-08-01 NOTE — ED Notes (Signed)
ED Doctor at bedside.  

## 2013-08-01 NOTE — ED Notes (Signed)
Patient eating turkey sandwich and applesauce 

## 2013-08-01 NOTE — Discharge Instructions (Signed)
You have been diagnosed by your caregiver as having chest wall pain. °SEEK IMMEDIATE MEDICAL ATTENTION IF: °You develop a fever.  °Your chest pains become severe or intolerable.  °You develop new, unexplained symptoms (problems).  °You develop shortness of breath, nausea, vomiting, sweating or feel light headed.  °You develop a new cough or you cough up blood. ° °

## 2013-08-03 LAB — URINE CULTURE

## 2013-08-04 ENCOUNTER — Ambulatory Visit: Payer: Medicare Other | Admitting: Physician Assistant

## 2013-08-09 IMAGING — CR DG CHEST 2V
2 series · 2 of 2 positions shown · non-contrast
Comparison: Chest radiograph and CT of the chest performed
12/19/2011

CLINICAL DATA: Left-sided chest pain; lower posterior neck pain,
radiating to both shoulders.

CHEST - 2 VIEW

[w chest pa]
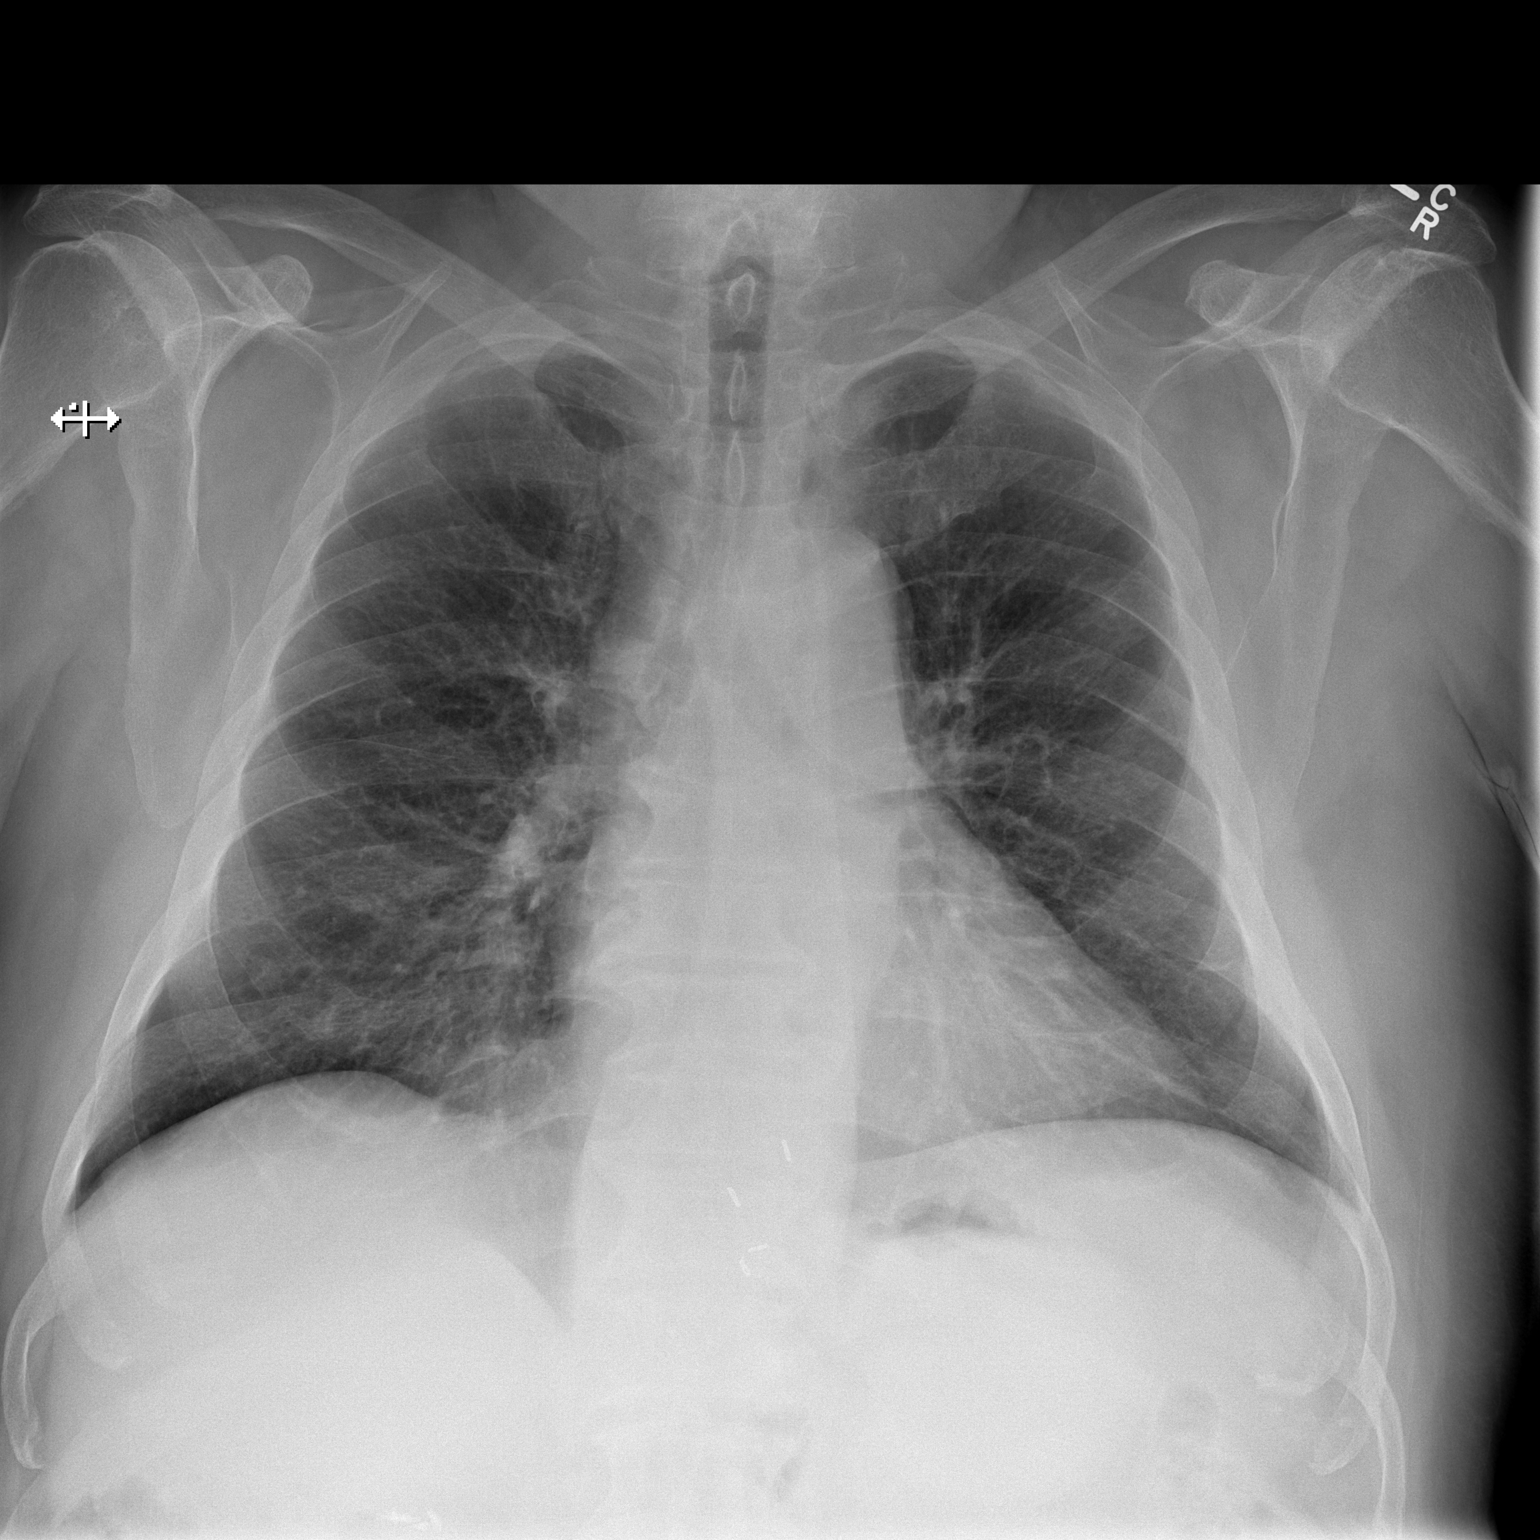

[w chest lat]
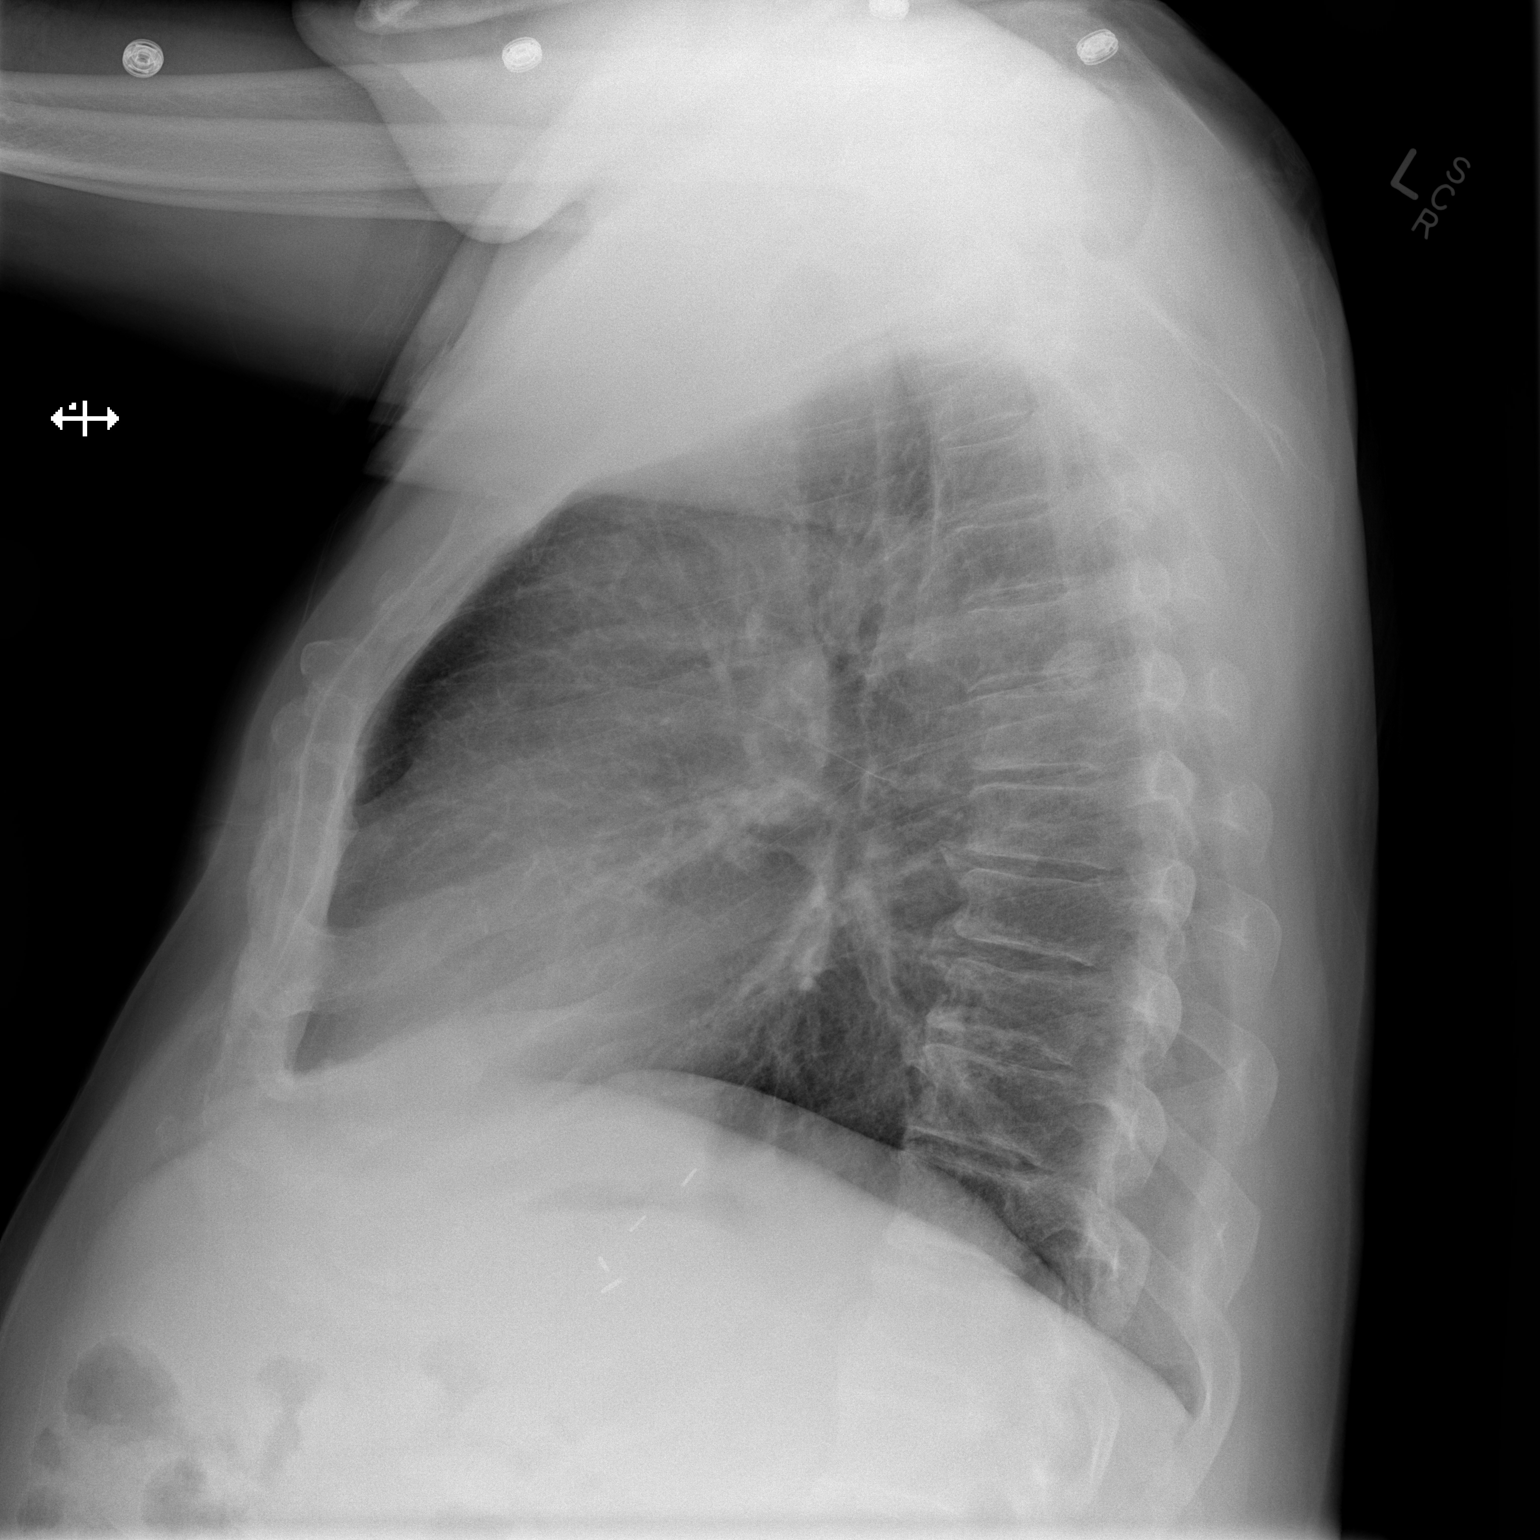

[2 of 2 positions shown; findings below may reference images not displayed]

FINDINGS: The lungs are well expanded.  Known tiny bilateral
pulmonary nodules are better characterized on prior CT.  A right-
sided nipple shadow is slightly better seen than on the prior
study.  No pleural effusion or pneumothorax is seen.  Mild
peribronchial thickening is noted.

The cardiomediastinal silhouette remains normal in size.  No acute
osseous abnormalities are seen.  Postoperative change is noted
about the gastroesophageal junction and at the right upper
quadrant.
IMPRESSION: Known tiny bilateral pulmonary nodules are better characterized on
prior CT; no acute cardiopulmonary process seen.

## 2013-08-15 ENCOUNTER — Ambulatory Visit (INDEPENDENT_AMBULATORY_CARE_PROVIDER_SITE_OTHER): Payer: Medicare Other | Admitting: Cardiology

## 2013-08-15 ENCOUNTER — Encounter: Payer: Self-pay | Admitting: Cardiology

## 2013-08-15 ENCOUNTER — Ambulatory Visit (INDEPENDENT_AMBULATORY_CARE_PROVIDER_SITE_OTHER): Payer: Medicare Other | Admitting: Pharmacist Clinician (PhC)/ Clinical Pharmacy Specialist

## 2013-08-15 VITALS — BP 148/81 | HR 72 | Ht 66.0 in | Wt 202.4 lb

## 2013-08-15 DIAGNOSIS — Z5181 Encounter for therapeutic drug level monitoring: Secondary | ICD-10-CM

## 2013-08-15 DIAGNOSIS — Z716 Tobacco abuse counseling: Secondary | ICD-10-CM

## 2013-08-15 DIAGNOSIS — I4891 Unspecified atrial fibrillation: Secondary | ICD-10-CM

## 2013-08-15 DIAGNOSIS — R079 Chest pain, unspecified: Secondary | ICD-10-CM

## 2013-08-15 DIAGNOSIS — Z7189 Other specified counseling: Secondary | ICD-10-CM

## 2013-08-15 DIAGNOSIS — I480 Paroxysmal atrial fibrillation: Secondary | ICD-10-CM | POA: Insufficient documentation

## 2013-08-15 DIAGNOSIS — I2510 Atherosclerotic heart disease of native coronary artery without angina pectoris: Secondary | ICD-10-CM

## 2013-08-15 DIAGNOSIS — Z7901 Long term (current) use of anticoagulants: Secondary | ICD-10-CM | POA: Insufficient documentation

## 2013-08-15 DIAGNOSIS — F17200 Nicotine dependence, unspecified, uncomplicated: Secondary | ICD-10-CM

## 2013-08-15 LAB — POCT INR: INR: 2.1

## 2013-08-15 MED ORDER — TRAMADOL HCL ER 100 MG PO TB24: 100.0000 mg | ORAL_TABLET | Freq: Two times a day (BID) | ORAL | Status: DC | PRN

## 2013-08-15 NOTE — Patient Instructions (Signed)
Weigh daily Call [phone] if weight climbs more than 3 pounds in a day or 5 pounds in a week. No salt to very little salt in your diet.  No more than 2000 mg in a day. Call if increased shortness of breath or increased swelling.   follow up with Dr. [name] in 6 weeks

## 2013-08-15 NOTE — Assessment & Plan Note (Signed)
occ chest pain, negative nuc study, EKG without acute changes, negative nuc study.

## 2013-08-18 NOTE — Assessment & Plan Note (Signed)
Briefly discussed.
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
## 2013-08-18 NOTE — Assessment & Plan Note (Signed)
On coumadin, stable, checked today

## 2013-08-18 NOTE — Assessment & Plan Note (Signed)
Non obstructive CAD, Nuc done this month negative for ischemia.

## 2013-08-18 NOTE — Assessment & Plan Note (Signed)
SR 1st degree AV block

## 2013-09-06 ENCOUNTER — Emergency Department (HOSPITAL_COMMUNITY)
Admission: EM | Admit: 2013-09-06 | Discharge: 2013-09-06 | Disposition: A | Discharge status: Home / Self Care | Payer: Medicare Other | Attending: Emergency Medicine | Admitting: Emergency Medicine

## 2013-09-06 ENCOUNTER — Emergency Department (HOSPITAL_COMMUNITY): Payer: Medicare Other

## 2013-09-06 ENCOUNTER — Encounter (HOSPITAL_COMMUNITY): Payer: Self-pay | Admitting: Emergency Medicine

## 2013-09-06 DIAGNOSIS — Z8739 Personal history of other diseases of the musculoskeletal system and connective tissue: Secondary | ICD-10-CM | POA: Insufficient documentation

## 2013-09-06 DIAGNOSIS — I2510 Atherosclerotic heart disease of native coronary artery without angina pectoris: Secondary | ICD-10-CM | POA: Insufficient documentation

## 2013-09-06 DIAGNOSIS — I4891 Unspecified atrial fibrillation: Secondary | ICD-10-CM | POA: Insufficient documentation

## 2013-09-06 DIAGNOSIS — I4892 Unspecified atrial flutter: Secondary | ICD-10-CM | POA: Insufficient documentation

## 2013-09-06 DIAGNOSIS — Z9889 Other specified postprocedural states: Secondary | ICD-10-CM | POA: Insufficient documentation

## 2013-09-06 DIAGNOSIS — E119 Type 2 diabetes mellitus without complications: Secondary | ICD-10-CM | POA: Insufficient documentation

## 2013-09-06 DIAGNOSIS — R739 Hyperglycemia, unspecified: Secondary | ICD-10-CM

## 2013-09-06 DIAGNOSIS — J449 Chronic obstructive pulmonary disease, unspecified: Secondary | ICD-10-CM | POA: Insufficient documentation

## 2013-09-06 DIAGNOSIS — I10 Essential (primary) hypertension: Secondary | ICD-10-CM | POA: Insufficient documentation

## 2013-09-06 DIAGNOSIS — Z7982 Long term (current) use of aspirin: Secondary | ICD-10-CM | POA: Insufficient documentation

## 2013-09-06 DIAGNOSIS — J4489 Other specified chronic obstructive pulmonary disease: Secondary | ICD-10-CM | POA: Insufficient documentation

## 2013-09-06 DIAGNOSIS — E785 Hyperlipidemia, unspecified: Secondary | ICD-10-CM | POA: Insufficient documentation

## 2013-09-06 DIAGNOSIS — Z8719 Personal history of other diseases of the digestive system: Secondary | ICD-10-CM | POA: Insufficient documentation

## 2013-09-06 DIAGNOSIS — Z794 Long term (current) use of insulin: Secondary | ICD-10-CM | POA: Insufficient documentation

## 2013-09-06 DIAGNOSIS — J069 Acute upper respiratory infection, unspecified: Secondary | ICD-10-CM | POA: Insufficient documentation

## 2013-09-06 DIAGNOSIS — Z79899 Other long term (current) drug therapy: Secondary | ICD-10-CM | POA: Insufficient documentation

## 2013-09-06 DIAGNOSIS — Z7901 Long term (current) use of anticoagulants: Secondary | ICD-10-CM | POA: Insufficient documentation

## 2013-09-06 DIAGNOSIS — Z86718 Personal history of other venous thrombosis and embolism: Secondary | ICD-10-CM | POA: Insufficient documentation

## 2013-09-06 DIAGNOSIS — I5032 Chronic diastolic (congestive) heart failure: Secondary | ICD-10-CM | POA: Insufficient documentation

## 2013-09-06 LAB — BASIC METABOLIC PANEL
BUN: 9 mg/dL (ref 6–23)
CALCIUM: 8.9 mg/dL (ref 8.4–10.5)
CO2: 21 meq/L (ref 19–32)
CREATININE: 0.95 mg/dL (ref 0.50–1.35)
Chloride: 99 mEq/L (ref 96–112)
GFR calc Af Amer: >90 mL/min (ref >90)
GFR, EST NON AFRICAN AMERICAN: 80 mL/min — AB (ref >90)
Glucose, Bld: 296 mg/dL — ABNORMAL HIGH (ref 70–99)
Potassium: 3.9 mEq/L (ref 3.7–5.3)
Sodium: 136 mEq/L — ABNORMAL LOW (ref 137–147)

## 2013-09-06 LAB — CBC WITH DIFFERENTIAL/PLATELET
BASOS ABS: 0 10*3/uL (ref 0.0–0.1)
Basophils Relative: 1 % (ref 0–1)
EOS ABS: 0.2 10*3/uL (ref 0.0–0.7)
EOS PCT: 2 % (ref 0–5)
HCT: 39.8 % (ref 39.0–52.0)
Hemoglobin: 12.7 g/dL — ABNORMAL LOW (ref 13.0–17.0)
LYMPHS PCT: 28 % (ref 12–46)
Lymphs Abs: 1.9 10*3/uL (ref 0.7–4.0)
MCH: 25.5 pg — ABNORMAL LOW (ref 26.0–34.0)
MCHC: 31.9 g/dL (ref 30.0–36.0)
MCV: 79.8 fL (ref 78.0–100.0)
MONO ABS: 0.4 10*3/uL (ref 0.1–1.0)
Monocytes Relative: 6 % (ref 3–12)
Neutro Abs: 4.4 10*3/uL (ref 1.7–7.7)
Neutrophils Relative %: 63 % (ref 43–77)
Platelets: 197 10*3/uL (ref 150–400)
RBC: 4.99 MIL/uL (ref 4.22–5.81)
RDW: 19 % — AB (ref 11.5–15.5)
WBC: 6.9 10*3/uL (ref 4.0–10.5)

## 2013-09-06 LAB — URINALYSIS, ROUTINE W REFLEX MICROSCOPIC
Bilirubin Urine: NEGATIVE
GLUCOSE, UA: 500 mg/dL — AB
Hgb urine dipstick: NEGATIVE
KETONES UR: NEGATIVE mg/dL
Leukocytes, UA: NEGATIVE
Nitrite: NEGATIVE
PROTEIN: NEGATIVE mg/dL
Specific Gravity, Urine: 1.018 (ref 1.005–1.030)
Urobilinogen, UA: 1 mg/dL (ref 0.0–1.0)
pH: 6 (ref 5.0–8.0)

## 2013-09-06 LAB — CBG MONITORING, ED: Glucose-Capillary: 208 mg/dL — ABNORMAL HIGH (ref 70–99)

## 2013-09-06 MED ORDER — INSULIN ASPART 100 UNIT/ML ~~LOC~~ SOLN
10.0000 [IU] | Freq: Once | SUBCUTANEOUS | Status: AC
Start: 2013-09-06 — End: 2013-09-06
  Administered 2013-09-06: 10 [IU] via SUBCUTANEOUS
  Filled 2013-09-06: qty 1

## 2013-09-06 MED ORDER — SODIUM CHLORIDE 0.9 % IV BOLUS (SEPSIS)
1000.0000 mL | Freq: Once | INTRAVENOUS | Status: AC
  Administered 2013-09-06: 1000 mL via INTRAVENOUS

## 2013-09-06 NOTE — ED Notes (Signed)
Pt c/o of cough and congestion for about a month. Pt states that it is clear phlegm.  Pt states my head stays "plugged up". Pt states that he gets weaker when he trys to walk now.  Pt sees Dr Elby Beck and was seen about month ago and told to watch it and if his symptoms got worse to go to the ED.

## 2013-09-06 NOTE — Discharge Instructions (Signed)
Manage your diabetes with your medications. Follow up with your doctor for further evaluation and management. Refer to attached documents for more information.
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
## 2013-09-07 NOTE — ED Provider Notes (Signed)
Medical screening examination/treatment/procedure(s) were performed by non-physician practitioner and as supervising physician I was immediately available for consultation/collaboration.   EKG Interpretation None       Virgel Manifold, MD 09/07/13 1009

## 2013-09-12 IMAGING — CR DG CHEST 2V
2 series · 2 of 2 positions shown · non-contrast
Comparison: Chest radiograph performed 05/29/2012, and CT of the
chest performed 12/19/2011

CLINICAL DATA: Cough and chest pain.

CHEST - 2 VIEW

[w chest pa]
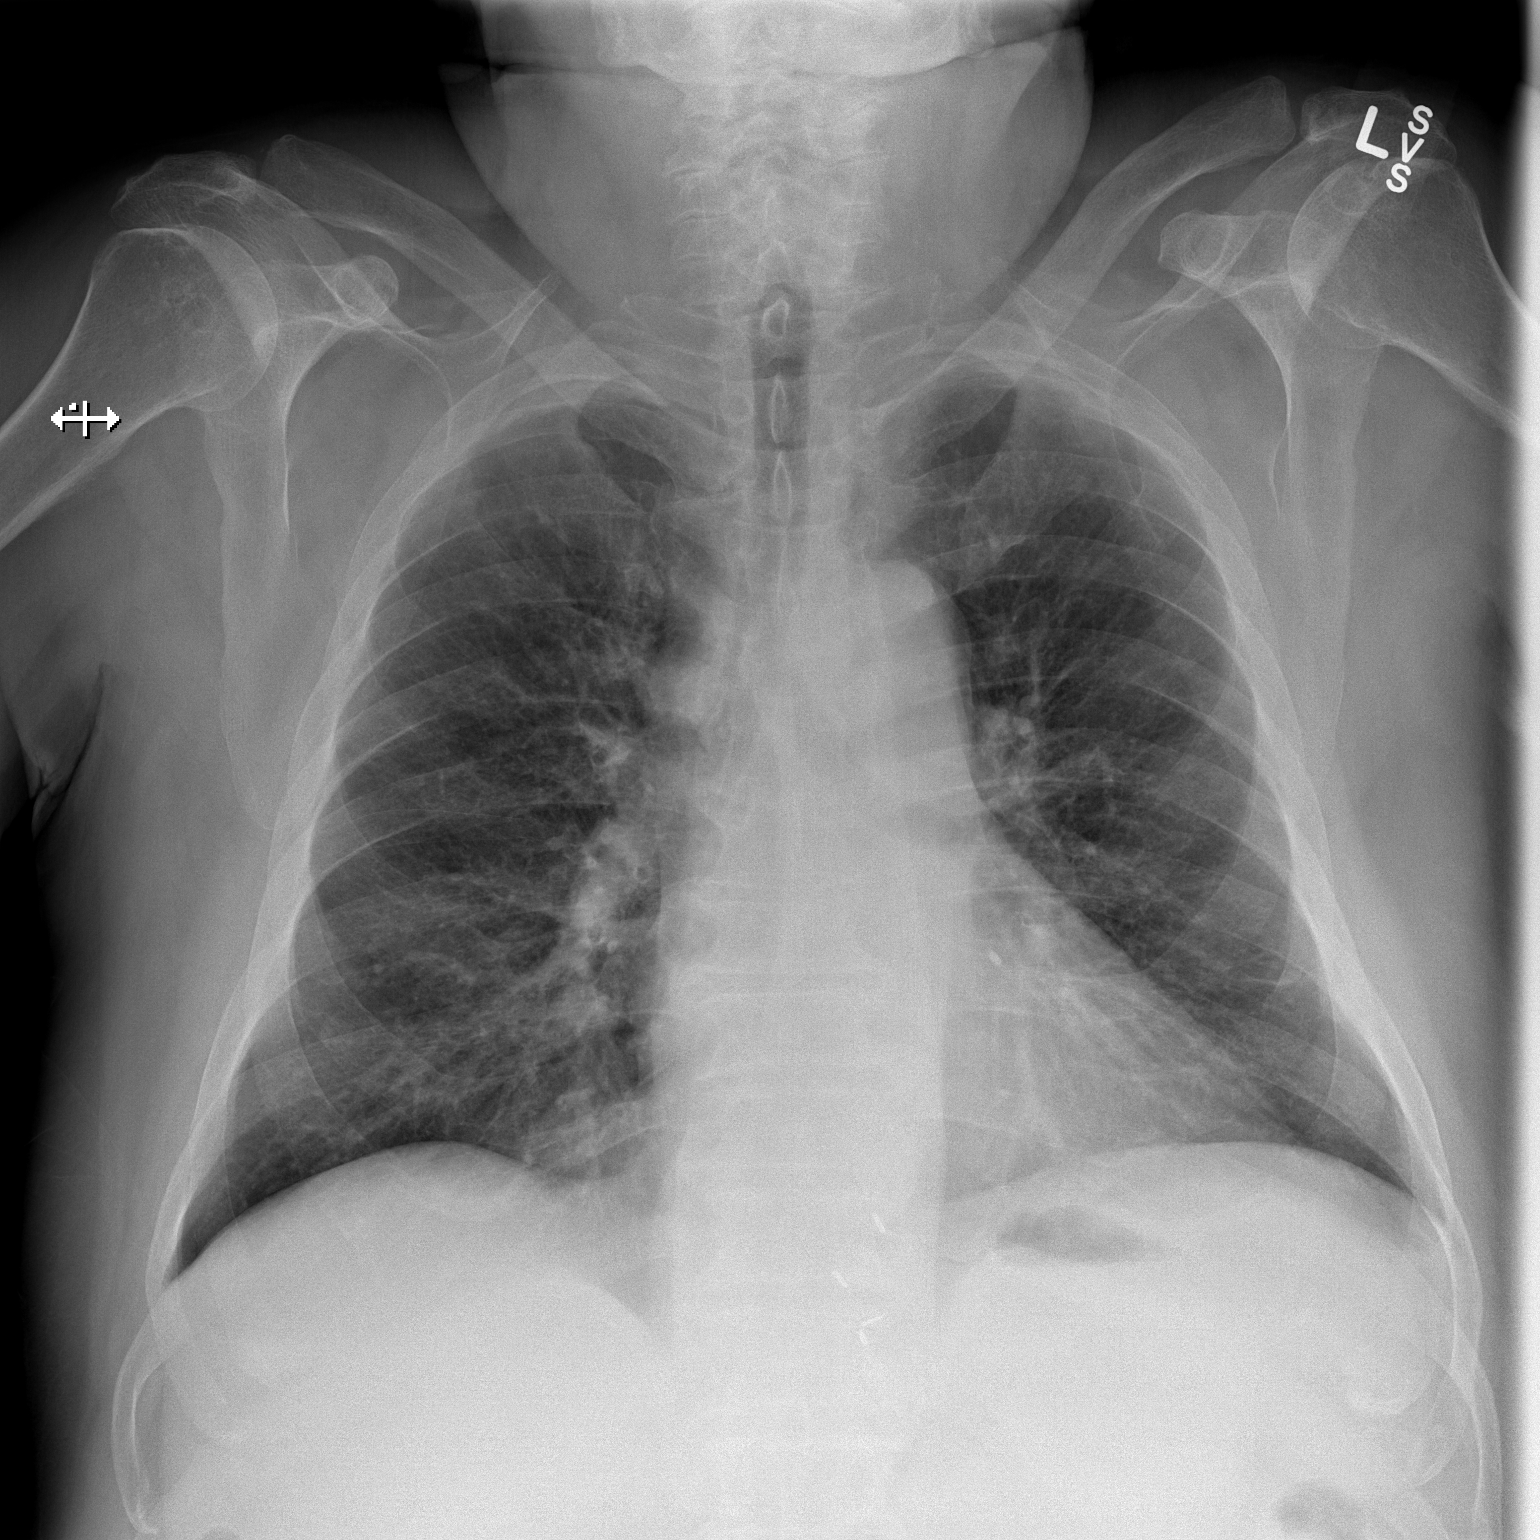

[w chest lat]
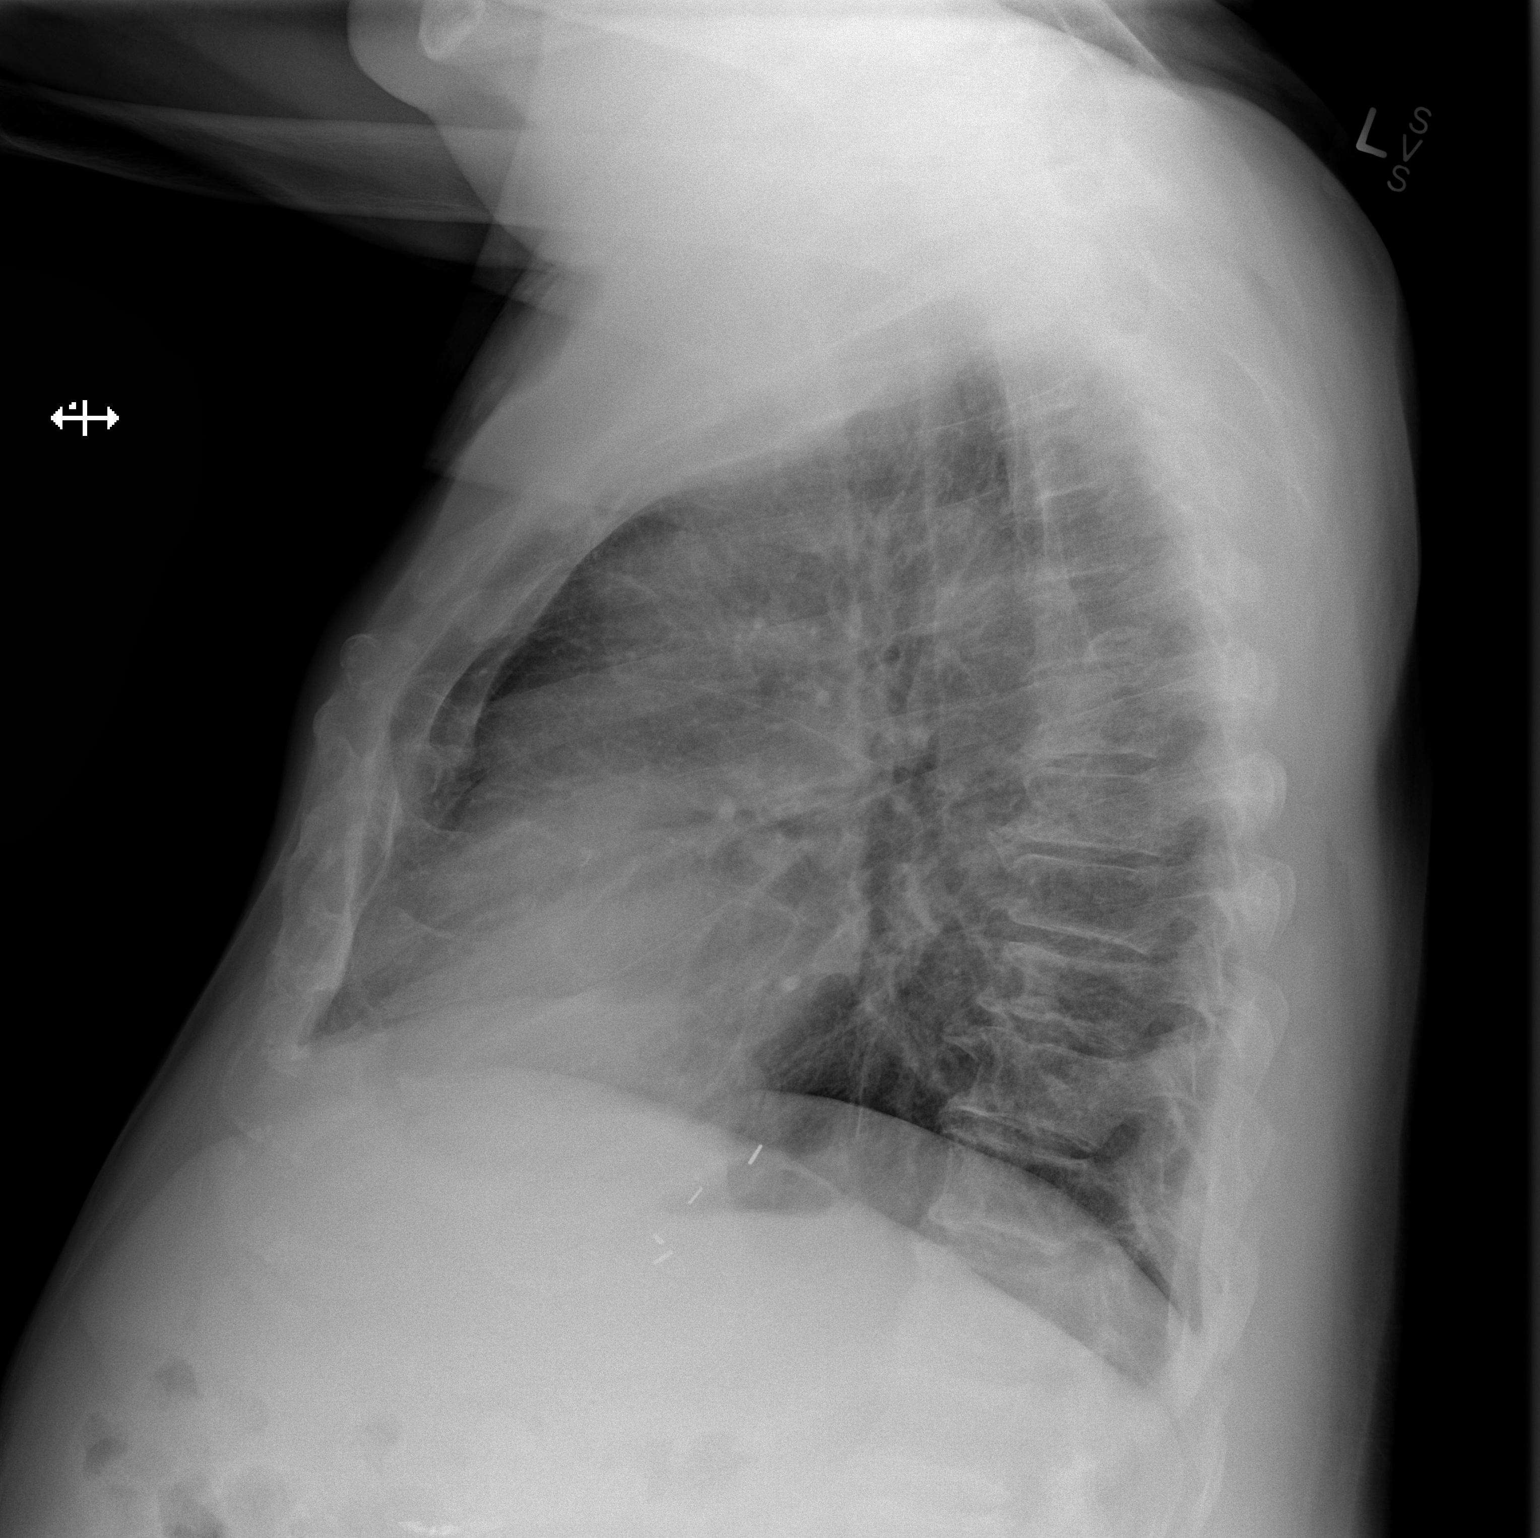

[2 of 2 positions shown; findings below may reference images not displayed]

FINDINGS: The lungs are well-aerated.  Chronically increased
interstitial markings are seen, with chronic peribronchial
thickening.  Known tiny bilateral pulmonary nodules are better seen
on prior CT.  No definite focal superimposed airspace consolidation
is identified.  No pleural effusion or pneumothorax is seen.

The heart is normal in size; the mediastinal contour is within
normal limits.  No acute osseous abnormalities are seen.
Postoperative change is noted about the gastroesophageal junction.
IMPRESSION: Chronically increased interstitial markings and chronic
peribronchial thickening; no acute focal airspace consolidation
seen.  Known tiny bilateral pulmonary nodules are better seen on
prior CT.

## 2013-10-06 ENCOUNTER — Ambulatory Visit: Payer: Medicare Other | Admitting: Cardiology

## 2013-10-17 ENCOUNTER — Ambulatory Visit: Payer: Medicare Other | Admitting: Pharmacist Clinician (PhC)/ Clinical Pharmacy Specialist

## 2013-10-17 ENCOUNTER — Encounter (HOSPITAL_COMMUNITY): Payer: Self-pay | Admitting: Emergency Medicine

## 2013-10-17 ENCOUNTER — Emergency Department (HOSPITAL_COMMUNITY)
Admission: EM | Admit: 2013-10-17 | Discharge: 2013-10-17 | Disposition: A | Discharge status: Home / Self Care | Payer: Medicare Other | Attending: Emergency Medicine | Admitting: Emergency Medicine

## 2013-10-17 DIAGNOSIS — Z794 Long term (current) use of insulin: Secondary | ICD-10-CM | POA: Insufficient documentation

## 2013-10-17 DIAGNOSIS — Z79899 Other long term (current) drug therapy: Secondary | ICD-10-CM | POA: Diagnosis not present

## 2013-10-17 DIAGNOSIS — M79609 Pain in unspecified limb: Secondary | ICD-10-CM | POA: Insufficient documentation

## 2013-10-17 DIAGNOSIS — Z7982 Long term (current) use of aspirin: Secondary | ICD-10-CM | POA: Diagnosis not present

## 2013-10-17 DIAGNOSIS — J4489 Other specified chronic obstructive pulmonary disease: Secondary | ICD-10-CM | POA: Insufficient documentation

## 2013-10-17 DIAGNOSIS — J449 Chronic obstructive pulmonary disease, unspecified: Secondary | ICD-10-CM | POA: Diagnosis not present

## 2013-10-17 DIAGNOSIS — Z8719 Personal history of other diseases of the digestive system: Secondary | ICD-10-CM | POA: Insufficient documentation

## 2013-10-17 DIAGNOSIS — G5602 Carpal tunnel syndrome, left upper limb: Secondary | ICD-10-CM

## 2013-10-17 DIAGNOSIS — M129 Arthropathy, unspecified: Secondary | ICD-10-CM | POA: Insufficient documentation

## 2013-10-17 DIAGNOSIS — I4892 Unspecified atrial flutter: Secondary | ICD-10-CM | POA: Diagnosis not present

## 2013-10-17 DIAGNOSIS — Z86718 Personal history of other venous thrombosis and embolism: Secondary | ICD-10-CM | POA: Insufficient documentation

## 2013-10-17 DIAGNOSIS — E785 Hyperlipidemia, unspecified: Secondary | ICD-10-CM | POA: Insufficient documentation

## 2013-10-17 DIAGNOSIS — I2510 Atherosclerotic heart disease of native coronary artery without angina pectoris: Secondary | ICD-10-CM | POA: Insufficient documentation

## 2013-10-17 DIAGNOSIS — I5032 Chronic diastolic (congestive) heart failure: Secondary | ICD-10-CM | POA: Diagnosis not present

## 2013-10-17 DIAGNOSIS — E119 Type 2 diabetes mellitus without complications: Secondary | ICD-10-CM | POA: Insufficient documentation

## 2013-10-17 DIAGNOSIS — I10 Essential (primary) hypertension: Secondary | ICD-10-CM | POA: Insufficient documentation

## 2013-10-17 DIAGNOSIS — Z7901 Long term (current) use of anticoagulants: Secondary | ICD-10-CM | POA: Diagnosis not present

## 2013-10-17 DIAGNOSIS — G5600 Carpal tunnel syndrome, unspecified upper limb: Secondary | ICD-10-CM | POA: Insufficient documentation

## 2013-10-17 DIAGNOSIS — F17200 Nicotine dependence, unspecified, uncomplicated: Secondary | ICD-10-CM | POA: Insufficient documentation

## 2013-10-17 HISTORY — DX: Unspecified osteoarthritis, unspecified site: M19.90

## 2013-10-17 MED ORDER — ACETAMINOPHEN 325 MG PO TABS: 650.0000 mg | ORAL_TABLET | Freq: Four times a day (QID) | ORAL | Status: DC | PRN

## 2013-10-17 NOTE — ED Notes (Signed)
States, "been to dr. But no one seems to want to do nothing about it."  States, "prescribed some 20 pills a way back."

## 2013-10-17 NOTE — ED Provider Notes (Signed)
Medical screening examination/treatment/procedure(s) were performed by non-physician practitioner and as supervising physician I was immediately available for consultation/collaboration.   EKG Interpretation None       Kalman Drape, MD 10/17/13 [phone]
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
## 2013-10-17 NOTE — Progress Notes (Signed)
Orthopedic Tech Progress Note Patient Details:  Timothee Gali Apr 28, 1940 [id number]  Ortho Devices Type of Ortho Device: Velcro wrist splint Ortho Device/Splint Location: left quick fit splint is applied for the patient. the wear and care is gone over verbally. he will contact nursing staff with any further questions. Ortho Device/Splint Interventions: Application   Ashok Cordia 10/17/2013, 6:39 AM

## 2013-10-17 NOTE — Discharge Instructions (Signed)
Take tylenol as directed as needed for your symptoms follow up with your primary care doctor.  Carpal Tunnel Syndrome The carpal tunnel is a narrow area located on the palm side of your wrist. The tunnel is formed by the wrist bones and ligaments. Nerves, blood vessels, and tendons pass through the carpal tunnel. Repeated wrist motion or certain diseases may cause swelling within the tunnel. This swelling pinches the main nerve in the wrist (median nerve) and causes the painful hand and arm condition called carpal tunnel syndrome. CAUSES   Repeated wrist motions.  Wrist injuries.  Certain diseases like arthritis, diabetes, alcoholism, hyperthyroidism, and kidney failure.  Obesity.  Pregnancy. SYMPTOMS   A "pins and needles" feeling in your fingers or hand, especially in your thumb, index and middle fingers.  Tingling or numbness in your fingers or hand.  An aching feeling in your entire arm, especially when your wrist and elbow are bent for long periods of time.  Wrist pain that goes up your arm to your shoulder.  Pain that goes down into your palm or fingers.  A weak feeling in your hands. DIAGNOSIS  Your health care provider will take your history and perform a physical exam. An electromyography test may be needed. This test measures electrical signals sent out by your nerves into the muscles. The electrical signals are usually slowed by carpal tunnel syndrome. You may also need X-rays. TREATMENT  Carpal tunnel syndrome may clear up by itself. Your health care provider may recommend a wrist splint or medicine such as a nonsteroidal anti-inflammatory medicine. Cortisone injections may help. Sometimes, surgery may be needed to free the pinched nerve.  HOME CARE INSTRUCTIONS   Take all medicine as directed by your health care provider. Only take over-the-counter or prescription medicines for pain, discomfort, or fever as directed by your health care provider.  If you were given a  splint to keep your wrist from bending, wear it as directed. It is important to wear the splint at night. Wear the splint for as long as you have pain or numbness in your hand, arm, or wrist. This may take 1 to 2 months.  Rest your wrist from any activity that may be causing your pain. If your symptoms are work-related, you may need to talk to your employer about changing to a job that does not require using your wrist.  Put ice on your wrist after long periods of wrist activity.  Put ice in a plastic bag.  Place a towel between your skin and the bag.  Leave the ice on for 15-20 minutes, 03-04 times a day.  Keep all follow-up visits as directed by your health care provider. This includes any orthopedic referrals, physical therapy, and rehabilitation. Any delay in getting necessary care could result in a delay or failure of your condition to heal. SEEK IMMEDIATE MEDICAL CARE IF:   You have new, unexplained symptoms.  Your symptoms get worse and are not helped or controlled with medicines. MAKE SURE YOU:   Understand these instructions.  Will watch your condition.  Will get help right away if you are not doing well or get worse. Document Released: 03/10/2000 Document Revised: 07/28/2013 Document Reviewed: 01/27/2011 [facility] Patient Information 2015 [place], [place]. This information is not intended to replace advice given to you by your health care provider. Make sure you discuss any questions you have with your health care provider.

## 2013-10-17 NOTE — ED Notes (Signed)
Pt. reports left hand pain / fingers numbness radiating to left upper arm for several weeks , denies injury.

## 2013-10-27 ENCOUNTER — Ambulatory Visit (INDEPENDENT_AMBULATORY_CARE_PROVIDER_SITE_OTHER): Payer: Medicare Other | Admitting: Pharmacist Clinician (PhC)/ Clinical Pharmacy Specialist

## 2013-10-27 DIAGNOSIS — Z7901 Long term (current) use of anticoagulants: Secondary | ICD-10-CM

## 2013-10-27 DIAGNOSIS — I4891 Unspecified atrial fibrillation: Secondary | ICD-10-CM

## 2013-10-27 DIAGNOSIS — I480 Paroxysmal atrial fibrillation: Secondary | ICD-10-CM

## 2013-10-27 LAB — POCT INR: INR: 2.4

## 2013-10-28 ENCOUNTER — Other Ambulatory Visit: Payer: Self-pay | Admitting: Pharmacist Clinician (PhC)/ Clinical Pharmacy Specialist

## 2013-10-28 ENCOUNTER — Other Ambulatory Visit: Payer: Self-pay | Admitting: Cardiology

## 2013-10-28 NOTE — Telephone Encounter (Signed)
Rx refill sent to patient pharmacy   

## 2013-11-24 ENCOUNTER — Telehealth: Payer: Self-pay | Admitting: Cardiology

## 2013-11-24 NOTE — Telephone Encounter (Signed)
Spoke with pt, he is noticing off and on bright blood on the toilet paper when he wipes. He relates it to after eating hot dogs. He has a hx of hemorrhoids but has not had a problem with them in some time. He denies pain or constipation. He has a coumadin appt this Friday, will discuss with pharm md and call the pt back. Pt agreed with this plan.

## 2013-11-24 NOTE — Telephone Encounter (Signed)
Please call,after certain meats he eats,blood comes in his stools.

## 2013-11-25 NOTE — Telephone Encounter (Signed)
Returned call to [name] - she states pt has been having rectal bleeding for several days off and on.  After talking to RN, decided it probably is hemorrhoids, as he couldn't always relate it to foods.    Advised they use OTC hemorrhoidal suppositories for 2-3 days, keep INR appointment for this Friday.  [name] voiced understanding.

## 2013-11-28 ENCOUNTER — Ambulatory Visit: Payer: Medicare Other | Admitting: Pharmacist Clinician (PhC)/ Clinical Pharmacy Specialist

## 2013-11-30 ENCOUNTER — Inpatient Hospital Stay (HOSPITAL_COMMUNITY)
Admission: EM | Admit: 2013-11-30 | Discharge: 2013-12-02 | DRG: 394 | Disposition: A | Discharge status: Home / Self Care | Payer: Medicare Other | Attending: Internal Medicine | Admitting: Internal Medicine

## 2013-11-30 ENCOUNTER — Encounter (HOSPITAL_COMMUNITY): Payer: Self-pay | Admitting: Emergency Medicine

## 2013-11-30 DIAGNOSIS — E1165 Type 2 diabetes mellitus with hyperglycemia: Secondary | ICD-10-CM | POA: Diagnosis present

## 2013-11-30 DIAGNOSIS — Z5181 Encounter for therapeutic drug level monitoring: Secondary | ICD-10-CM

## 2013-11-30 DIAGNOSIS — K5730 Diverticulosis of large intestine without perforation or abscess without bleeding: Secondary | ICD-10-CM | POA: Diagnosis present

## 2013-11-30 DIAGNOSIS — Z794 Long term (current) use of insulin: Secondary | ICD-10-CM | POA: Diagnosis not present

## 2013-11-30 DIAGNOSIS — R079 Chest pain, unspecified: Secondary | ICD-10-CM

## 2013-11-30 DIAGNOSIS — F17200 Nicotine dependence, unspecified, uncomplicated: Secondary | ICD-10-CM | POA: Diagnosis present

## 2013-11-30 DIAGNOSIS — R0602 Shortness of breath: Secondary | ICD-10-CM

## 2013-11-30 DIAGNOSIS — N179 Acute kidney failure, unspecified: Secondary | ICD-10-CM

## 2013-11-30 DIAGNOSIS — IMO0002 Reserved for concepts with insufficient information to code with codable children: Secondary | ICD-10-CM | POA: Diagnosis present

## 2013-11-30 DIAGNOSIS — Z7982 Long term (current) use of aspirin: Secondary | ICD-10-CM | POA: Diagnosis not present

## 2013-11-30 DIAGNOSIS — J189 Pneumonia, unspecified organism: Secondary | ICD-10-CM

## 2013-11-30 DIAGNOSIS — E782 Mixed hyperlipidemia: Secondary | ICD-10-CM | POA: Diagnosis present

## 2013-11-30 DIAGNOSIS — Z79899 Other long term (current) drug therapy: Secondary | ICD-10-CM | POA: Diagnosis not present

## 2013-11-30 DIAGNOSIS — R6521 Severe sepsis with septic shock: Secondary | ICD-10-CM

## 2013-11-30 DIAGNOSIS — K648 Other hemorrhoids: Principal | ICD-10-CM | POA: Diagnosis present

## 2013-11-30 DIAGNOSIS — I509 Heart failure, unspecified: Secondary | ICD-10-CM

## 2013-11-30 DIAGNOSIS — Z7901 Long term (current) use of anticoagulants: Secondary | ICD-10-CM

## 2013-11-30 DIAGNOSIS — R072 Precordial pain: Secondary | ICD-10-CM

## 2013-11-30 DIAGNOSIS — E785 Hyperlipidemia, unspecified: Secondary | ICD-10-CM | POA: Diagnosis present

## 2013-11-30 DIAGNOSIS — IMO0001 Reserved for inherently not codable concepts without codable children: Secondary | ICD-10-CM

## 2013-11-30 DIAGNOSIS — Z6831 Body mass index (BMI) 31.0-31.9, adult: Secondary | ICD-10-CM

## 2013-11-30 DIAGNOSIS — I4891 Unspecified atrial fibrillation: Secondary | ICD-10-CM | POA: Diagnosis present

## 2013-11-30 DIAGNOSIS — J4489 Other specified chronic obstructive pulmonary disease: Secondary | ICD-10-CM | POA: Diagnosis present

## 2013-11-30 DIAGNOSIS — J449 Chronic obstructive pulmonary disease, unspecified: Secondary | ICD-10-CM | POA: Diagnosis present

## 2013-11-30 DIAGNOSIS — I2510 Atherosclerotic heart disease of native coronary artery without angina pectoris: Secondary | ICD-10-CM | POA: Diagnosis present

## 2013-11-30 DIAGNOSIS — D129 Benign neoplasm of anus and anal canal: Secondary | ICD-10-CM

## 2013-11-30 DIAGNOSIS — Z86718 Personal history of other venous thrombosis and embolism: Secondary | ICD-10-CM

## 2013-11-30 DIAGNOSIS — I5032 Chronic diastolic (congestive) heart failure: Secondary | ICD-10-CM

## 2013-11-30 DIAGNOSIS — I25118 Atherosclerotic heart disease of native coronary artery with other forms of angina pectoris: Secondary | ICD-10-CM | POA: Diagnosis present

## 2013-11-30 DIAGNOSIS — D128 Benign neoplasm of rectum: Secondary | ICD-10-CM | POA: Diagnosis present

## 2013-11-30 DIAGNOSIS — I4892 Unspecified atrial flutter: Secondary | ICD-10-CM

## 2013-11-30 DIAGNOSIS — E669 Obesity, unspecified: Secondary | ICD-10-CM | POA: Diagnosis present

## 2013-11-30 DIAGNOSIS — Z8249 Family history of ischemic heart disease and other diseases of the circulatory system: Secondary | ICD-10-CM

## 2013-11-30 DIAGNOSIS — A419 Sepsis, unspecified organism: Secondary | ICD-10-CM

## 2013-11-30 DIAGNOSIS — K625 Hemorrhage of anus and rectum: Secondary | ICD-10-CM | POA: Diagnosis present

## 2013-11-30 DIAGNOSIS — E118 Type 2 diabetes mellitus with unspecified complications: Secondary | ICD-10-CM

## 2013-11-30 DIAGNOSIS — I480 Paroxysmal atrial fibrillation: Secondary | ICD-10-CM

## 2013-11-30 DIAGNOSIS — K922 Gastrointestinal hemorrhage, unspecified: Secondary | ICD-10-CM

## 2013-11-30 DIAGNOSIS — I10 Essential (primary) hypertension: Secondary | ICD-10-CM | POA: Diagnosis present

## 2013-11-30 DIAGNOSIS — Z716 Tobacco abuse counseling: Secondary | ICD-10-CM

## 2013-11-30 LAB — GLUCOSE, CAPILLARY
GLUCOSE-CAPILLARY: 154 mg/dL — AB (ref 70–99)
GLUCOSE-CAPILLARY: 139 mg/dL — AB (ref 70–99)
GLUCOSE-CAPILLARY: 156 mg/dL — AB (ref 70–99)
Glucose-Capillary: 103 mg/dL — ABNORMAL HIGH (ref 70–99)
Glucose-Capillary: 118 mg/dL — ABNORMAL HIGH (ref 70–99)
Glucose-Capillary: 133 mg/dL — ABNORMAL HIGH (ref 70–99)

## 2013-11-30 LAB — CBC WITH DIFFERENTIAL/PLATELET
Basophils Absolute: 0.1 10*3/uL (ref 0.0–0.1)
Basophils Relative: 1 % (ref 0–1)
EOS PCT: 2 % (ref 0–5)
Eosinophils Absolute: 0.2 10*3/uL (ref 0.0–0.7)
HCT: 41.5 % (ref 39.0–52.0)
Hemoglobin: 14 g/dL (ref 13.0–17.0)
Lymphocytes Relative: 24 % (ref 12–46)
Lymphs Abs: 2.2 10*3/uL (ref 0.7–4.0)
MCH: 29.4 pg (ref 26.0–34.0)
MCHC: 33.7 g/dL (ref 30.0–36.0)
MCV: 87.2 fL (ref 78.0–100.0)
Monocytes Absolute: 0.7 10*3/uL (ref 0.1–1.0)
Monocytes Relative: 8 % (ref 3–12)
Neutro Abs: 6.1 10*3/uL (ref 1.7–7.7)
Neutrophils Relative %: 65 % (ref 43–77)
Platelets: 262 10*3/uL (ref 150–400)
RBC: 4.76 MIL/uL (ref 4.22–5.81)
RDW: 14.7 % (ref 11.5–15.5)
WBC: 9.3 10*3/uL (ref 4.0–10.5)

## 2013-11-30 LAB — PROTIME-INR
INR: 1.82 — ABNORMAL HIGH (ref 0.00–1.49)
Prothrombin Time: 21.1 seconds — ABNORMAL HIGH (ref 11.6–15.2)

## 2013-11-30 LAB — COMPREHENSIVE METABOLIC PANEL
ALT: 23 U/L (ref 0–53)
ANION GAP: 20 — AB (ref 5–15)
AST: 20 U/L (ref 0–37)
Albumin: 3.5 g/dL (ref 3.5–5.2)
Alkaline Phosphatase: 78 U/L (ref 39–117)
BUN: 14 mg/dL (ref 6–23)
CALCIUM: 8.8 mg/dL (ref 8.4–10.5)
CO2: 19 mEq/L (ref 19–32)
Chloride: 100 mEq/L (ref 96–112)
Creatinine, Ser: 0.87 mg/dL (ref 0.50–1.35)
GFR calc Af Amer: >90 mL/min (ref >90)
GFR calc non Af Amer: 83 mL/min — ABNORMAL LOW (ref >90)
Glucose, Bld: 168 mg/dL — ABNORMAL HIGH (ref 70–99)
Potassium: 4.1 mEq/L (ref 3.7–5.3)
Sodium: 139 mEq/L (ref 137–147)
TOTAL PROTEIN: 7 g/dL (ref 6.0–8.3)
Total Bilirubin: <0.2 mg/dL — ABNORMAL LOW (ref 0.3–1.2)

## 2013-11-30 LAB — HEMOGLOBIN AND HEMATOCRIT, BLOOD
HCT: 39.4 % (ref 39.0–52.0)
HCT: 40.1 % (ref 39.0–52.0)
HCT: 39 % (ref 39.0–52.0)
HEMOGLOBIN: 13.2 g/dL (ref 13.0–17.0)
Hemoglobin: 13.4 g/dL (ref 13.0–17.0)
Hemoglobin: 12.8 g/dL — ABNORMAL LOW (ref 13.0–17.0)

## 2013-11-30 LAB — APTT: APTT: 36 s (ref 24–37)

## 2013-11-30 LAB — TYPE AND SCREEN
ABO/RH(D): A NEG
Antibody Screen: NEGATIVE

## 2013-11-30 LAB — HEMOGLOBIN A1C
HEMOGLOBIN A1C: 7 % — AB (ref <5.7)
Mean Plasma Glucose: 154 mg/dL — ABNORMAL HIGH (ref <117)

## 2013-11-30 LAB — POC OCCULT BLOOD, ED: FECAL OCCULT BLD: POSITIVE — AB

## 2013-11-30 MED ORDER — SODIUM CHLORIDE 0.9 % IV SOLN: INTRAVENOUS | Status: DC

## 2013-11-30 MED ORDER — PANTOPRAZOLE SODIUM 40 MG IV SOLR
40.0000 mg | Freq: Two times a day (BID) | INTRAVENOUS | Status: DC
  Administered 2013-11-30 – 2013-12-01 (×4): 40 mg via INTRAVENOUS
  Filled 2013-11-30 (×6): qty 40

## 2013-11-30 MED ORDER — ONDANSETRON HCL 4 MG PO TABS
4.0000 mg | ORAL_TABLET | Freq: Four times a day (QID) | ORAL | Status: DC | PRN
Start: 2013-11-30 — End: 2013-12-02

## 2013-11-30 MED ORDER — INSULIN ASPART 100 UNIT/ML ~~LOC~~ SOLN
0.0000 [IU] | SUBCUTANEOUS | Status: DC
  Administered 2013-11-30: 2 [IU] via SUBCUTANEOUS

## 2013-11-30 MED ORDER — ASPIRIN 81 MG PO CHEW: 81.0000 mg | CHEWABLE_TABLET | Freq: Every day | ORAL | Status: DC

## 2013-11-30 MED ORDER — SIMVASTATIN 20 MG PO TABS: 20.0000 mg | ORAL_TABLET | Freq: Every day | ORAL | Status: DC

## 2013-11-30 MED ORDER — HYDROMORPHONE HCL PF 1 MG/ML IJ SOLN: 0.5000 mg | INTRAMUSCULAR | Status: DC | PRN

## 2013-11-30 MED ORDER — NITROGLYCERIN 0.4 MG SL SUBL: 0.4000 mg | SUBLINGUAL_TABLET | SUBLINGUAL | Status: DC | PRN

## 2013-11-30 MED ORDER — ACETAMINOPHEN 650 MG RE SUPP: 650.0000 mg | Freq: Four times a day (QID) | RECTAL | Status: DC | PRN

## 2013-11-30 MED ORDER — LORATADINE 10 MG PO TABS
10.0000 mg | ORAL_TABLET | ORAL | Status: DC
  Administered 2013-11-30 – 2013-12-02 (×3): 10 mg via ORAL
  Filled 2013-11-30 (×5): qty 1

## 2013-11-30 MED ORDER — ACETAMINOPHEN 325 MG PO TABS: 650.0000 mg | ORAL_TABLET | Freq: Four times a day (QID) | ORAL | Status: DC | PRN

## 2013-11-30 MED ORDER — FLECAINIDE ACETATE 50 MG PO TABS
225.0000 mg | ORAL_TABLET | Freq: Two times a day (BID) | ORAL | Status: DC
  Administered 2013-11-30 – 2013-12-01 (×4): 225 mg via ORAL
  Filled 2013-11-30 (×6): qty 1

## 2013-11-30 MED ORDER — INSULIN ASPART 100 UNIT/ML ~~LOC~~ SOLN: 0.0000 [IU] | Freq: Every day | SUBCUTANEOUS | Status: DC

## 2013-11-30 MED ORDER — ONDANSETRON HCL 4 MG/2ML IJ SOLN: 4.0000 mg | Freq: Four times a day (QID) | INTRAMUSCULAR | Status: DC | PRN

## 2013-11-30 MED ORDER — OXYCODONE HCL 5 MG PO TABS: 5.0000 mg | ORAL_TABLET | ORAL | Status: DC | PRN

## 2013-11-30 MED ORDER — SODIUM CHLORIDE 0.9 % IV SOLN
INTRAVENOUS | Status: AC
  Administered 2013-11-30: 06:00:00 via INTRAVENOUS

## 2013-11-30 MED ORDER — DILTIAZEM HCL ER COATED BEADS 240 MG PO CP24
240.0000 mg | ORAL_CAPSULE | Freq: Every day | ORAL | Status: DC
  Administered 2013-11-30 – 2013-12-01 (×2): 240 mg via ORAL
  Filled 2013-11-30 (×3): qty 1

## 2013-11-30 MED ORDER — VITAMIN K1 10 MG/ML IJ SOLN
10.0000 mg | Freq: Once | INTRAVENOUS | Status: AC
  Administered 2013-11-30: 10 mg via INTRAVENOUS
  Filled 2013-11-30: qty 1

## 2013-11-30 MED ORDER — PEG 3350-KCL-NA BICARB-NACL 420 G PO SOLR
4000.0000 mL | Freq: Once | ORAL | Status: AC
  Administered 2013-11-30: 4000 mL via ORAL
  Filled 2013-11-30: qty 4000

## 2013-11-30 MED ORDER — PRAVASTATIN SODIUM 40 MG PO TABS
40.0000 mg | ORAL_TABLET | Freq: Every day | ORAL | Status: DC
  Administered 2013-12-01: 40 mg via ORAL
  Filled 2013-11-30 (×3): qty 1

## 2013-11-30 MED ORDER — INSULIN ASPART 100 UNIT/ML ~~LOC~~ SOLN
0.0000 [IU] | Freq: Three times a day (TID) | SUBCUTANEOUS | Status: DC
  Administered 2013-12-01 (×2): 2 [IU] via SUBCUTANEOUS
  Administered 2013-12-01 – 2013-12-02 (×2): 1 [IU] via SUBCUTANEOUS

## 2013-11-30 MED ORDER — METOPROLOL SUCCINATE ER 50 MG PO TB24
50.0000 mg | ORAL_TABLET | Freq: Two times a day (BID) | ORAL | Status: DC
Start: 2013-11-30 — End: 2013-12-02
  Administered 2013-11-30 – 2013-12-01 (×4): 50 mg via ORAL
  Filled 2013-11-30 (×6): qty 1

## 2013-11-30 NOTE — ED Notes (Signed)
Pt states that he when he went to the restroom earlier, there was blood in his stool.
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
## 2013-11-30 NOTE — ED Notes (Signed)
MD at bedside. 

## 2013-11-30 NOTE — ED Notes (Signed)
Attempted report 
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
## 2013-11-30 NOTE — Progress Notes (Signed)
Patient is awake and oriented x 4. Denies c./o pain/discomfort at present time. Patient reports that his last bowel movement was this morning, and that he did note blood in his stool. Patient is in sinus rhythm on the monitor. Oriented to call bell system and room. Encouraged patient to call Rn with any needs/concerns. Will continue to monitor.  Esperanza Heir, RN

## 2013-11-30 NOTE — ED Notes (Signed)
Per EMS: pt coming from home with c/o rectal bleeding. Pt states rectal bleeding for three months. Pt reports spotting but today reports a steady flow of blood. Pt is on Coumadin, hx of A-fib. Pt A&Ox4, respirations equal and unlabored, skin warm and dry

## 2013-11-30 NOTE — ED Notes (Signed)
Jenkins, MD at bedside.  
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
## 2013-11-30 NOTE — ED Notes (Signed)
Pt ambulatory with steady gait to the bathroom.

## 2013-11-30 NOTE — Progress Notes (Signed)
Patient Demographics  [name], is a 73 y.o. male, DOB - 1940-12-20, CBS:[id number]  Admit date - 11/30/2013   Admitting Physician Theressa Millard, MD  Outpatient Primary MD for the patient is [name], MD  LOS - 0   Chief Complaint  Patient presents with  . Rectal Bleeding     Brief HPI:    73 y.o. male with a history of Atrial fibrillation/Flutter S/P Ablation and on Coumadin rx, history also of HTN, Diastolic CHF, DM2, and Hyperlipidemia who presents to the ED with complaints of painless BRBPR x 1 month with worsening over the past 2 days. He denies having any nausea or vomiting or chest pain SOB or Dizziness. He was found to have an initial hemoglobin level of 14. His INR is 1.82. He was administered vitamin K , [name] remained stable, on Protonix drip.      Subjective:   [name] today has, No headache, No chest pain, No abdominal pain - No Nausea, No new weakness tingling or numbness, No Cough - SOB.   Assessment & Plan    Principal Problem:   Lower gastrointestinal bleeding Active Problems:   Atrial flutter - status post CTI ablation   Hypertension   Chronic diastolic congestive heart failure   CAD (coronary artery disease)   DM (diabetes mellitus), type 2, uncontrolled with complications   Long term (current) use of anticoagulants   Hyperlipidemia   Tobacco use disorder   1. Lower gastrointestinal bleeding  Vitamin K given to Reverse Coumadin  IV Protonix  Monitor H/Hs  Transfuse PRN  GI consult Continue with clear liquid diet    2. Atrial flutter - status post CTI ablation  Continue Fleicanide, Cardizem  Coumadin being reveresed   3. Hypertension  Continue CArdizem  Monitor BPs   4. Chronic diastolic congestive heart failure  Monitor for signs and Sxs of Fluid Overload   5. CAD (coronary artery disease)    NTG PRN  No complaints of chest pain or shortness of breath Resume aspirin when stable  6. DM (diabetes mellitus), type 2, uncontrolled with complications  CBG acceptable SSI coverage while on clear liquids  Hold Lantus Insulin and Metformin Rx   7. Long term (current) use of anticoagulants  Discontinued  Reversing with Vit K x 1   8. Hyperlipidemia  Continue Statin Rx.   9. Tobacco use disorder  Couneled  Nicotine Patch while inpatient     Code Status: Full  Family Communication: No family at bedside  Disposition Plan: Home   Procedures  None   Consults   Gastroenterology Dr. Collene Mares   Medications  Scheduled Meds: . sodium chloride   Intravenous STAT  . aspirin  81 mg Oral Daily  . diltiazem  240 mg Oral Daily  . flecainide  225 mg Oral BID  . insulin aspart  0-9 Units Subcutaneous 6 times per day  . loratadine  10 mg Oral BH-q7a  . metoprolol succinate  50 mg Oral BID  . pantoprazole (PROTONIX) IV  40 mg Intravenous Q12H  . pravastatin  40 mg Oral q1800   Continuous Infusions: . sodium chloride     PRN Meds:.acetaminophen, acetaminophen, HYDROmorphone (DILAUDID) injection, nitroGLYCERIN, ondansetron (ZOFRAN) IV, ondansetron, oxyCODONE  DVT Prophylaxis  SCDs   Lab Results  Component Value Date   PLT 262 11/30/2013    Antibiotics    Anti-infectives   None          Objective:   Filed Vitals:   11/30/13 0330 11/30/13 0431 11/30/13 0531 11/30/13 0632  BP: 117/104 105/54 110/60 137/87  Pulse: 71 65 65 86  Temp:    97.7 F (36.5 C)  TempSrc:    Oral  Resp: 20 19 21 20   Height:    5\' 6"  (1.676 m)  Weight:    87.726 kg (193 lb 6.4 oz)  SpO2: 93% 94% 94% 97%    Wt Readings from Last 3 Encounters:  11/30/13 87.726 kg (193 lb 6.4 oz)  10/17/13 90.719 kg (200 lb)  08/15/13 91.808 kg (202 lb 6.4 oz)     Intake/Output Summary (Last 24 hours) at 11/30/13 0920 Last data filed at 11/30/13 0910  Gross per 24 hour  Intake      0 ml  Output      75 ml  Net    -75 ml     Physical Exam  Awake Alert, Oriented X 3, No new F.N deficits, Normal affect [place].AT,PERRAL Supple Neck,No JVD, No cervical lymphadenopathy appriciated.  Symmetrical Chest wall movement, Good air movement bilaterally, CTAB RRR,No Gallops,Rubs or new Murmurs, No Parasternal Heave +ve B.Sounds, Abd Soft, No tenderness, No organomegaly appriciated, No rebound - guarding or rigidity. Obese, has midline old surgical scar from previous surgery. No Cyanosis, Clubbing or edema, No new Rash or bruise     Data Review   Micro Results No results found for this or any previous visit (from the past 240 hour(s)).  Radiology Reports No results found.  CBC  Recent Labs Lab 11/30/13 0255 11/30/13 0635  WBC 9.3  --   HGB 14.0 13.4  HCT 41.5 39.4  PLT 262  --   MCV 87.2  --   MCH 29.4  --   MCHC 33.7  --   RDW 14.7  --   LYMPHSABS 2.2  --   MONOABS 0.7  --   EOSABS 0.2  --   BASOSABS 0.1  --     Chemistries   Recent Labs Lab 11/30/13 0255  NA 139  K 4.1  CL 100  CO2 19  GLUCOSE 168*  BUN 14  CREATININE 0.87  CALCIUM 8.8  AST 20  ALT 23  ALKPHOS 78  BILITOT <0.2*   ------------------------------------------------------------------------------------------------------------------ estimated creatinine clearance is 77.3 ml/min (by C-G formula based on Cr of 0.87). ------------------------------------------------------------------------------------------------------------------ No results found for this basename: HGBA1C,  in the last 72 hours ------------------------------------------------------------------------------------------------------------------ No results found for this basename: CHOL, HDL, LDLCALC, TRIG, CHOLHDL, LDLDIRECT,  in the last 72 hours ------------------------------------------------------------------------------------------------------------------ No results found for this basename: TSH, T4TOTAL, FREET3, T3FREE, THYROIDAB,   in the last 72 hours ------------------------------------------------------------------------------------------------------------------ No results found for this basename: VITAMINB12, FOLATE, FERRITIN, TIBC, IRON, RETICCTPCT,  in the last 72 hours  Coagulation profile  Recent Labs Lab 11/30/13 0255  INR 1.82*    No results found for this basename: DDIMER,  in the last 72 hours  Cardiac Enzymes No results found for this basename: CK, CKMB, TROPONINI, MYOGLOBIN,  in the last 168 hours ------------------------------------------------------------------------------------------------------------------ No components found with this basename: POCBNP,      Time Spent in minutes 30 minutes   Randall Rampersad M.D on 11/30/2013 at 9:20 AM  Between 7am to 7pm - Pager - [phone]  After 7pm go to [email or web address] -  password [email or web address]  And look for the night coverage person covering for me after hours  [facility] Office  [phone]   **Disclaimer: This note may have been dictated with voice recognition software. Similar sounding words can inadvertently be transcribed and this note may contain transcription errors which may not have been corrected upon publication of note.**

## 2013-12-01 ENCOUNTER — Encounter (HOSPITAL_COMMUNITY)
Admission: EM | Disposition: A | Discharge status: Home / Self Care | Payer: Self-pay | Source: Home / Self Care | Attending: Internal Medicine

## 2013-12-01 LAB — PROTIME-INR
INR: 1.14 (ref 0.00–1.49)
Prothrombin Time: 14.6 seconds (ref 11.6–15.2)

## 2013-12-01 LAB — CBC
HEMATOCRIT: 40.8 % (ref 39.0–52.0)
Hemoglobin: 13.3 g/dL (ref 13.0–17.0)
MCH: 28.7 pg (ref 26.0–34.0)
MCHC: 32.6 g/dL (ref 30.0–36.0)
MCV: 88.1 fL (ref 78.0–100.0)
PLATELETS: 219 10*3/uL (ref 150–400)
RBC: 4.63 MIL/uL (ref 4.22–5.81)
RDW: 14.7 % (ref 11.5–15.5)
WBC: 6.7 10*3/uL (ref 4.0–10.5)

## 2013-12-01 LAB — GLUCOSE, CAPILLARY
GLUCOSE-CAPILLARY: 122 mg/dL — AB (ref 70–99)
GLUCOSE-CAPILLARY: 121 mg/dL — AB (ref 70–99)
Glucose-Capillary: 153 mg/dL — ABNORMAL HIGH (ref 70–99)
Glucose-Capillary: 183 mg/dL — ABNORMAL HIGH (ref 70–99)

## 2013-12-01 LAB — BASIC METABOLIC PANEL
Anion gap: 14 (ref 5–15)
BUN: 10 mg/dL (ref 6–23)
CALCIUM: 8.7 mg/dL (ref 8.4–10.5)
CO2: 25 mEq/L (ref 19–32)
CREATININE: 0.92 mg/dL (ref 0.50–1.35)
Chloride: 102 mEq/L (ref 96–112)
GFR calc Af Amer: >90 mL/min (ref >90)
GFR, EST NON AFRICAN AMERICAN: 81 mL/min — AB (ref >90)
Glucose, Bld: 137 mg/dL — ABNORMAL HIGH (ref 70–99)
Potassium: 4.1 mEq/L (ref 3.7–5.3)
SODIUM: 141 meq/L (ref 137–147)

## 2013-12-01 LAB — HEMOGLOBIN AND HEMATOCRIT, BLOOD
HCT: 41.7 % (ref 39.0–52.0)
HCT: 39.3 % (ref 39.0–52.0)
HEMOGLOBIN: 14.2 g/dL (ref 13.0–17.0)
Hemoglobin: 13.3 g/dL (ref 13.0–17.0)

## 2013-12-01 SURGERY — COLONOSCOPY
Anesthesia: Moderate Sedation | Laterality: Left

## 2013-12-01 MED ORDER — PEG 3350-KCL-NA BICARB-NACL 420 G PO SOLR
4000.0000 mL | Freq: Once | ORAL | Status: AC
Start: 2013-12-01 — End: 2013-12-01
  Administered 2013-12-01: 4000 mL via ORAL
  Filled 2013-12-01: qty 4000

## 2013-12-01 NOTE — Progress Notes (Signed)
Patient Demographics  [name], is a 73 y.o. male, DOB - 18-Jul-1940, HKV:[id number]  Admit date - 11/30/2013   Admitting Physician Theressa Millard, MD  Outpatient Primary MD for the patient is Trevor Inches, MD  LOS - 1   Chief Complaint  Patient presents with  . Rectal Bleeding     Brief HPI:    73 y.o. male with a history of Atrial fibrillation/Flutter S/P Ablation and on Coumadin rx, history also of HTN, Diastolic CHF, DM2, and Hyperlipidemia who presents to the ED with complaints of painless BRBPR x 1 month with worsening over the past 2 days. He denies having any nausea or vomiting or chest pain SOB or Dizziness. He was found to have an initial hemoglobin level of 14. His INR is 1.82. He was administered vitamin K , [name] remained stable, on Protonix drip.      Subjective:   [name] today has, No headache, No chest pain, No abdominal pain - No Nausea, No new weakness tingling or numbness, No Cough - SOB. Having diarrhea secondary to bowel preparation for EGD, not clear bowel movements yet.  Assessment & Plan    Principal Problem:   Lower gastrointestinal bleeding Active Problems:   Atrial flutter - status post CTI ablation   Hypertension   Chronic diastolic congestive heart failure   CAD (coronary artery disease)   DM (diabetes mellitus), type 2, uncontrolled with complications   Long term (current) use of anticoagulants   Hyperlipidemia   Tobacco use disorder   1. Lower gastrointestinal bleeding  Vitamin K given to Reverse Coumadin ,INR WNL IV Protonix  Monitor H/Hs , stable. Transfuse PRN : no indication for transfusion. GI consult appreciated, Poor prep by am, will cont with Golytely, possible EGD today if good bowel prep. Continue with clear liquid diet    2. Atrial flutter - status post CTI ablation  Continue  Fleicanide, Cardizem  Coumadin being reveresed   3. Hypertension  Continue CArdizem  Monitor BPs   4. Chronic diastolic congestive heart failure  Monitor for signs and Sxs of Fluid Overload   5. CAD (coronary artery disease)  NTG PRN  No complaints of chest pain or shortness of breath Resume aspirin when stable  6. DM (diabetes mellitus), type 2, uncontrolled with complications  CBG acceptable SSI coverage while on clear liquids  Hold Lantus Insulin and Metformin Rx   7. Long term (current) use of anticoagulants  Discontinued  Reversing with Vit K x 1   8. Hyperlipidemia  Continue Statin Rx.   9. Tobacco use disorder  Couneled  Nicotine Patch while inpatient     Code Status: Full  Family Communication: No family at bedside  Disposition Plan: Home   Procedures  None   Consults   Gastroenterology Dr Collene Mares   Medications  Scheduled Meds: . diltiazem  240 mg Oral Daily  . flecainide  225 mg Oral BID  . insulin aspart  0-5 Units Subcutaneous QHS  . insulin aspart  0-9 Units Subcutaneous TID WC  . loratadine  10 mg Oral BH-q7a  . metoprolol succinate  50 mg Oral BID  . pantoprazole (PROTONIX) IV  40 mg Intravenous Q12H  . pravastatin  40 mg Oral q1800  Continuous Infusions:   PRN Meds:.acetaminophen, acetaminophen, HYDROmorphone (DILAUDID) injection, nitroGLYCERIN, ondansetron (ZOFRAN) IV, ondansetron, oxyCODONE  DVT Prophylaxis  SCDs   Lab Results  Component Value Date   PLT 219 12/01/2013    Antibiotics    Anti-infectives   None          Objective:   Filed Vitals:   11/30/13 1340 11/30/13 2136 11/30/13 2217 12/01/13 0620  BP: 109/64 114/69 107/67 102/64  Pulse: 68 79 54 66  Temp: 97.8 F (36.6 C)  98.1 F (36.7 C) 97.6 F (36.4 C)  TempSrc: Oral  Oral Oral  Resp: 20  18 18   Height:      Weight:    86.2 kg (190 lb 0.6 oz)  SpO2: 96%  93% 96%    Wt Readings from Last 3 Encounters:  12/01/13 86.2 kg (190 lb 0.6 oz)  12/01/13  86.2 kg (190 lb 0.6 oz)  10/17/13 90.719 kg (200 lb)     Intake/Output Summary (Last 24 hours) at 12/01/13 1214 Last data filed at 12/01/13 1205  Gross per 24 hour  Intake    990 ml  Output      0 ml  Net    990 ml     Physical Exam  Awake Alert, Oriented X 3, No new F.N deficits, Normal affect [place].AT,PERRAL Supple Neck,No JVD, No cervical lymphadenopathy appriciated.  Symmetrical Chest wall movement, Good air movement bilaterally, CTAB RRR,No Gallops,Rubs or new Murmurs, No Parasternal Heave +ve B.Sounds, Abd Soft, No tenderness, No organomegaly appriciated, No rebound - guarding or rigidity. Obese, has midline old surgical scar from previous surgery. No Cyanosis, Clubbing or edema, No new Rash or bruise     Data Review   Micro Results No results found for this or any previous visit (from the past 240 hour(s)).  Radiology Reports No results found.  CBC  Recent Labs Lab 11/30/13 0255 11/30/13 0635 11/30/13 1338 11/30/13 2116 12/01/13 0231  WBC 9.3  --   --   --  6.7  HGB 14.0 13.4 13.2 12.8* 13.3  HCT 41.5 39.4 40.1 39.0 40.8  PLT 262  --   --   --  219  MCV 87.2  --   --   --  88.1  MCH 29.4  --   --   --  28.7  MCHC 33.7  --   --   --  32.6  RDW 14.7  --   --   --  14.7  LYMPHSABS 2.2  --   --   --   --   MONOABS 0.7  --   --   --   --   EOSABS 0.2  --   --   --   --   BASOSABS 0.1  --   --   --   --     Chemistries   Recent Labs Lab 11/30/13 0255 12/01/13 0231  NA 139 141  K 4.1 4.1  CL 100 102  CO2 19 25  GLUCOSE 168* 137*  BUN 14 10  CREATININE 0.87 0.92  CALCIUM 8.8 8.7  AST 20  --   ALT 23  --   ALKPHOS 78  --   BILITOT <0.2*  --    ------------------------------------------------------------------------------------------------------------------ estimated creatinine clearance is 72.5 ml/min (by C-G formula based on Cr of  0.92). ------------------------------------------------------------------------------------------------------------------  Recent Labs  11/30/13 0635  HGBA1C 7.0*   ------------------------------------------------------------------------------------------------------------------ No results found for this basename: CHOL, HDL, LDLCALC, TRIG, CHOLHDL, LDLDIRECT,  in the  last 72 hours ------------------------------------------------------------------------------------------------------------------ No results found for this basename: TSH, T4TOTAL, FREET3, T3FREE, THYROIDAB,  in the last 72 hours ------------------------------------------------------------------------------------------------------------------ No results found for this basename: VITAMINB12, FOLATE, FERRITIN, TIBC, IRON, RETICCTPCT,  in the last 72 hours  Coagulation profile  Recent Labs Lab 11/30/13 0255 12/01/13 0231  INR 1.82* 1.14    No results found for this basename: DDIMER,  in the last 72 hours  Cardiac Enzymes No results found for this basename: CK, CKMB, TROPONINI, MYOGLOBIN,  in the last 168 hours ------------------------------------------------------------------------------------------------------------------ No components found with this basename: POCBNP,      Time Spent in minutes 25 minutes   Rohini Jaroszewski M.D on 12/01/2013 at 12:14 PM  Between 7am to 7pm - Pager - [phone]  After 7pm go to [email or web address] - password [email or web address]  And look for the night coverage person covering for me after hours  [facility] Office  [phone]   **Disclaimer: This note may have been dictated with voice recognition software. Similar sounding words can inadvertently be transcribed and this note may contain transcription errors which may not have been corrected upon publication of note.**

## 2013-12-01 NOTE — Progress Notes (Signed)
Patient sleeping peacefully. Patient has drank the majority of the [place] fluid in preparation for a colonoscopy this am. Patient is otherwise maintained NPO. Patient has had several loose, semi-formed bowel movements and has noted a scant amount of red blood in his stool as per his report. Patient maintained on telemetry and is in sinus rhythm on the monitor. Will continue to monitor.  Esperanza Heir, RN

## 2013-12-01 NOTE — Progress Notes (Signed)
Spoke with Dr. Eda Paschal office , (gastroenterology), and informed them that patient is not clear yet for scheduled colonoscopy today. Office staff informed this Probation officer that they would page Dr. Collene Mares and inform him of this information, and would return this nurse's phone-call. Awaiting return phone-call.  Esperanza Heir, RN. CHPN.

## 2013-12-01 NOTE — Progress Notes (Signed)
Patient completed the entire container of Golytely. However, patient's stool is still brown, not clear. Notified Dr. Bernell List. Awaiting return phone-call.  Esperanza Heir, RN

## 2013-12-01 NOTE — Progress Notes (Signed)
Received return phonecall from Dr. Tammi Klippel, who ordered for the patient to repeat Golytely prep and that the colonoscopy would be canceled for today and rescheduled for tomorrow, secondary to patient's stool not being clear. This nurse verbalized understanding.  Esperanza Heir, RN

## 2013-12-01 NOTE — Progress Notes (Signed)
Unassigned  Cross cover [employer] Subjective: Since I last evaluated the patient, he has been taking his prep for a colonoscopy today buy as he was not completely cleaned out a repeat prep has been ordered. He claims he has seen some blood in his stool while doing the prep.  Objective: Vital signs in last 24 hours: Temp:  [97.6 F (36.4 C)-98.1 F (36.7 C)] 97.6 F (36.4 C) (09/07 0620) Pulse Rate:  [54-79] 66 (09/07 0620) Resp:  [18-20] 18 (09/07 0620) BP: (102-114)/(64-69) 102/64 mmHg (09/07 0620) SpO2:  [93 %-96 %] 96 % (09/07 0620) Weight:  [86.2 kg (190 lb 0.6 oz)] 86.2 kg (190 lb 0.6 oz) (09/07 0620) Last BM Date: 11/30/13  Intake/Output from previous day: 09/06 0701 - 09/07 0700 In: 62 [P.O.:570] Out: 75 [Urine:75] Intake/Output this shift:   General appearance: alert, cooperative, appears stated age, no distress and moderately obese Resp: clear to auscultation bilaterally Cardio: regular rate and rhythm, S1, S2 normal, no murmur, click, rub or gallop GI: soft, non-tender; bowel sounds normal; no masses,  no organomegaly  Lab Results:  Recent Labs  11/30/13 0255  11/30/13 1338 11/30/13 2116 12/01/13 0231  WBC 9.3  --   --   --  6.7  HGB 14.0  < > 13.2 12.8* 13.3  HCT 41.5  < > 40.1 39.0 40.8  PLT 262  --   --   --  219  < > = values in this interval not displayed. BMET  Recent Labs  11/30/13 0255 12/01/13 0231  NA 139 141  K 4.1 4.1  CL 100 102  CO2 19 25  GLUCOSE 168* 137*  BUN 14 10  CREATININE 0.87 0.92  CALCIUM 8.8 8.7   LFT  Recent Labs  11/30/13 0255  PROT 7.0  ALBUMIN 3.5  AST 20  ALT 23  ALKPHOS 78  BILITOT <0.2*   PT/INR  Recent Labs  11/30/13 0255 12/01/13 0231  LABPROT 21.1* 14.6  INR 1.82* 1.14   Medications: I have reviewed the patient's current medications.  Assessment/Plan: Rectal bleeding on Coumadin-currently off all anticoagulants; bing prepped for a colonoscopy tomorrow.   LOS: 1 day   [name] 12/01/2013,  8:58 AM

## 2013-12-02 ENCOUNTER — Encounter (HOSPITAL_COMMUNITY)
Admission: EM | Disposition: A | Discharge status: Home / Self Care | Payer: Self-pay | Source: Home / Self Care | Attending: Internal Medicine

## 2013-12-02 ENCOUNTER — Encounter (HOSPITAL_COMMUNITY): Payer: Self-pay

## 2013-12-02 DIAGNOSIS — D128 Benign neoplasm of rectum: Secondary | ICD-10-CM

## 2013-12-02 DIAGNOSIS — D129 Benign neoplasm of anus and anal canal: Secondary | ICD-10-CM

## 2013-12-02 DIAGNOSIS — K922 Gastrointestinal hemorrhage, unspecified: Secondary | ICD-10-CM

## 2013-12-02 HISTORY — PX: COLONOSCOPY: SHX5424

## 2013-12-02 LAB — CBC
HCT: 40.2 % (ref 39.0–52.0)
Hemoglobin: 13.2 g/dL (ref 13.0–17.0)
MCH: 28.4 pg (ref 26.0–34.0)
MCHC: 32.8 g/dL (ref 30.0–36.0)
MCV: 86.6 fL (ref 78.0–100.0)
PLATELETS: 246 10*3/uL (ref 150–400)
RBC: 4.64 MIL/uL (ref 4.22–5.81)
RDW: 14.7 % (ref 11.5–15.5)
WBC: 6.5 10*3/uL (ref 4.0–10.5)

## 2013-12-02 LAB — BASIC METABOLIC PANEL
ANION GAP: 15 (ref 5–15)
BUN: 9 mg/dL (ref 6–23)
CO2: 23 mEq/L (ref 19–32)
Calcium: 8.8 mg/dL (ref 8.4–10.5)
Chloride: 98 mEq/L (ref 96–112)
Creatinine, Ser: 0.94 mg/dL (ref 0.50–1.35)
GFR calc Af Amer: >90 mL/min (ref >90)
GFR, EST NON AFRICAN AMERICAN: 80 mL/min — AB (ref >90)
Glucose, Bld: 131 mg/dL — ABNORMAL HIGH (ref 70–99)
POTASSIUM: 4 meq/L (ref 3.7–5.3)
SODIUM: 136 meq/L — AB (ref 137–147)

## 2013-12-02 LAB — PROTIME-INR
INR: 1.11 (ref 0.00–1.49)
PROTHROMBIN TIME: 14.3 s (ref 11.6–15.2)

## 2013-12-02 LAB — GLUCOSE, CAPILLARY: GLUCOSE-CAPILLARY: 142 mg/dL — AB (ref 70–99)

## 2013-12-02 SURGERY — COLONOSCOPY
Anesthesia: Moderate Sedation

## 2013-12-02 MED ORDER — MIDAZOLAM HCL 5 MG/5ML IJ SOLN
INTRAMUSCULAR | Status: DC | PRN
  Administered 2013-12-02: 1 mg via INTRAVENOUS
  Administered 2013-12-02: 2 mg via INTRAVENOUS
  Administered 2013-12-02 (×2): 1 mg via INTRAVENOUS
  Administered 2013-12-02: 2 mg via INTRAVENOUS

## 2013-12-02 MED ORDER — FENTANYL CITRATE 0.05 MG/ML IJ SOLN
INTRAMUSCULAR | Status: DC | PRN
  Administered 2013-12-02 (×3): 25 ug via INTRAVENOUS

## 2013-12-02 MED ORDER — SODIUM CHLORIDE 0.9 % IV SOLN
INTRAVENOUS | Status: DC
  Administered 2013-12-02: 10:00:00 via INTRAVENOUS

## 2013-12-02 MED ORDER — FENTANYL CITRATE 0.05 MG/ML IJ SOLN
INTRAMUSCULAR | Status: AC
Start: 2013-12-02 — End: 2013-12-02
  Filled 2013-12-02: qty 4

## 2013-12-02 MED ORDER — NICOTINE 21 MG/24HR TD PT24: 21.0000 mg | MEDICATED_PATCH | Freq: Every day | TRANSDERMAL | Status: DC

## 2013-12-02 MED ORDER — MIDAZOLAM HCL 5 MG/ML IJ SOLN
INTRAMUSCULAR | Status: AC
  Filled 2013-12-02: qty 2
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
## 2013-12-02 NOTE — Op Note (Signed)
[facility] [place], [id number]   COLONOSCOPY PROCEDURE REPORT  PATIENT: Trevor, [name]  MR#: [id number] BIRTHDATE: 10-May-1940 , 48  yrs. old GENDER: Male ENDOSCOPIST: Eustace Quail, MD REFERRED QZ:RAQTM Hospitalist PROCEDURE DATE:  12/02/2013 PROCEDURE:   Colonoscopy with snare polypectomy x 1 First Screening Colonoscopy - Avg.  risk and is 50 yrs.  old or older - No.  Prior Negative Screening - Now for repeat screening. [name]  History of Adenoma - Now for follow-up colonoscopy & has been > or = to 3 yrs.  [name]  Polyps Removed Today? Yes. ASA CLASS:   Class III INDICATIONS:chronic intermittent hematochezia.  on Coumadin. INR now corrected. Hemoglobin normal at 13.2 MEDICATIONS: Versed 7 mg IV, Fentanyl 75 mcg IV. Medications were titrated to patient response per physician's verbal order DESCRIPTION OF PROCEDURE:   After the risks benefits and alternatives of the procedure were thoroughly explained, informed consent was obtained.  A digital rectal exam revealed no abnormalities of the rectum.   The [employer] Adult Colon [id number] endoscope was introduced through the anus and advanced to the cecum, which was identified by both the appendix and ileocecal valve. No adverse events experienced.   The quality of the prep was good, using Colyte  The instrument was then slowly withdrawn as the colon was fully examined.  COLON FINDINGS: A diminutive polyp was found in the rectum.  A polypectomy was performed with a cold snare.  The resection was complete and the polyp tissue was completely retrieved.   Mild diverticulosis was noted The finding was in the left colon.   The colon mucosa was otherwise normal.  Retroflexed views revealed moderate internal hemorrhoids. The time to cecum=4 minutes 0 seconds.  Withdrawal time=10 minutes 0 seconds.  The scope was withdrawn and the procedure completed. COMPLICATIONS: There were no complications.  ENDOSCOPIC  IMPRESSION: 1.   Diminutive polyp was found in the rectum; polypectomy was performed with a cold snare 2.   Mild diverticulosis was noted in the left colon 3.   The colon mucosa was otherwise normal . Hemoorhoids as source of chronic minor rectal bleeding  RECOMMENDATIONS: 1.  Anusol HC supp.  prn 2.  Okay to resume Coumadin if medically necessary.Return to the care of your primary provider.  GI follow up as needed   eSigned:  Eustace Quail, MD 12/02/2013 10:45 AM   cc: Bernerd Limbo, MD    ; Glenetta Hew, MD

## 2013-12-02 NOTE — H&P (View-Only) (Signed)
Unassigned  Cross cover [employer] Subjective: Since I last evaluated the patient, he has been taking his prep for a colonoscopy today buy as he was not completely cleaned out a repeat prep has been ordered. He claims he has seen some blood in his stool while doing the prep.  Objective: Vital signs in last 24 hours: Temp:  [97.6 F (36.4 C)-98.1 F (36.7 C)] 97.6 F (36.4 C) (09/07 0620) Pulse Rate:  [54-79] 66 (09/07 0620) Resp:  [18-20] 18 (09/07 0620) BP: (102-114)/(64-69) 102/64 mmHg (09/07 0620) SpO2:  [93 %-96 %] 96 % (09/07 0620) Weight:  [86.2 kg (190 lb 0.6 oz)] 86.2 kg (190 lb 0.6 oz) (09/07 0620) Last BM Date: 11/30/13  Intake/Output from previous day: 09/06 0701 - 09/07 0700 In: 67 [P.O.:570] Out: 75 [Urine:75] Intake/Output this shift:   General appearance: alert, cooperative, appears stated age, no distress and moderately obese Resp: clear to auscultation bilaterally Cardio: regular rate and rhythm, S1, S2 normal, no murmur, click, rub or gallop GI: soft, non-tender; bowel sounds normal; no masses,  no organomegaly  Lab Results:  Recent Labs  11/30/13 0255  11/30/13 1338 11/30/13 2116 12/01/13 0231  WBC 9.3  --   --   --  6.7  HGB 14.0  < > 13.2 12.8* 13.3  HCT 41.5  < > 40.1 39.0 40.8  PLT 262  --   --   --  219  < > = values in this interval not displayed. BMET  Recent Labs  11/30/13 0255 12/01/13 0231  NA 139 141  K 4.1 4.1  CL 100 102  CO2 19 25  GLUCOSE 168* 137*  BUN 14 10  CREATININE 0.87 0.92  CALCIUM 8.8 8.7   LFT  Recent Labs  11/30/13 0255  PROT 7.0  ALBUMIN 3.5  AST 20  ALT 23  ALKPHOS 78  BILITOT <0.2*   PT/INR  Recent Labs  11/30/13 0255 12/01/13 0231  LABPROT 21.1* 14.6  INR 1.82* 1.14   Medications: I have reviewed the patient's current medications.  Assessment/Plan: Rectal bleeding on Coumadin-currently off all anticoagulants; bing prepped for a colonoscopy tomorrow.   LOS: 1 day   [name] 12/01/2013,  8:58 AM

## 2013-12-02 NOTE — Care Management Note (Addendum)
  Page 1 of 1   12/02/2013     4:35:24 PM CARE MANAGEMENT NOTE 12/02/2013  Patient:  [name], [name]   Account Number:  [id number]  Date Initiated:  12/02/2013  Documentation initiated by:  Mariann Laster  Subjective/Objective Assessment:   Rectal bleeding     Action/Plan:   CM to follow for disposition needs   Anticipated DC Date:  12/03/2013   Anticipated DC Plan:  HOME/SELF CARE         Choice offered to / List presented to:             Status of service:  Completed, signed off Medicare Important Message given?  YES (If response is "NO", the following Medicare IM given date fields will be blank) Date Medicare IM given:  12/02/2013 Medicare IM given by:  Kanija Remmel Date Additional Medicare IM given:   Additional Medicare IM given by:    Discharge Disposition:    Per UR Regulation:    If discussed at Long Length of Stay Meetings, dates discussed:    Comments:  Cienna Dumais RN, BSN, MSHL, CCM  Nurse - Case Manager,  ([facility] [place])  [phone]  12/02/2013 From home with DTR+ Son in [place] Colonoscopy this admission Disposition Plan:  Home / Self care

## 2013-12-02 NOTE — Interval H&P Note (Signed)
History and Physical Interval Note:  12/02/2013 9:38 AM  [name]  has presented today for surgery, with the diagnosis of rectal bleeding  The various methods of treatment have been discussed with the patient and family. After consideration of risks, benefits and other options for treatment, the patient has consented to  Procedure(s): COLONOSCOPY ([name]) as a surgical intervention .  The patient's history has been reviewed, patient examined, no change in status, stable for surgery.  I have reviewed the patient's chart and labs.  Questions were answered to the patient's satisfaction.  [name]
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
## 2013-12-02 NOTE — Discharge Instructions (Signed)
Follow with Primary MD BOUSKA,DAVID E, MD in 7 days   Get CBC, CMP, 2 view Chest X ray checked  by Primary MD next visit.    Activity: As tolerated with Full fall precautions use walker/cane & assistance as needed   Disposition Home    Diet: Heart Healthy , feeding assistance and aspiration precautions if needed.  For Heart failure patients - Check your Weight same time everyday, if you gain over 2 pounds, or you develop in leg swelling, experience more shortness of breath or chest pain, call your Primary MD immediately. Follow Cardiac Low Salt Diet and 1.8 lit/day fluid restriction.   On your next visit with her primary care physician please Get Medicines reviewed and adjusted.  Please request your Prim.MD to go over all Hospital Tests and Procedure/Radiological results at the follow up, please get all Hospital records sent to your Prim MD by signing hospital release before you go home.   If you experience worsening of your admission symptoms, develop shortness of breath, life threatening emergency, suicidal or homicidal thoughts you must seek medical attention immediately by calling 911 or calling your MD immediately  if symptoms less severe.  You Must read complete instructions/literature along with all the possible adverse reactions/side effects for all the Medicines you take and that have been prescribed to you. Take any new Medicines after you have completely understood and accpet all the possible adverse reactions/side effects.   Do not drive, operating heavy machinery, perform activities at heights, swimming or participation in water activities or provide baby sitting services if your were admitted for syncope or siezures until you have seen by Primary MD or a Neurologist and advised to do so again.  Do not drive when taking Pain medications.    Do not take more than prescribed Pain, Sleep and Anxiety Medications  Special Instructions: If you have smoked or chewed Tobacco  in  the last 2 yrs please stop smoking, stop any regular Alcohol  and or any Recreational drug use.  Wear Seat belts while driving.   Please note  You were cared for by a hospitalist during your hospital stay. If you have any questions about your discharge medications or the care you received while you were in the hospital after you are discharged, you can call the unit and asked to speak with the hospitalist on call if the hospitalist that took care of you is not available. Once you are discharged, your primary care physician will handle any further medical issues. Please note that NO REFILLS for any discharge medications will be authorized once you are discharged, as it is imperative that you return to your primary care physician (or establish a relationship with a primary care physician if you do not have one) for your aftercare needs so that they can reassess your need for medications and monitor your lab values.

## 2013-12-02 NOTE — Interval H&P Note (Signed)
History and Physical Interval Note:  12/02/2013 9:37 AM  [name]  has presented today for surgery, with the diagnosis of rectal bleeding  The various methods of treatment have been discussed with the patient and family. After consideration of risks, benefits and other options for treatment, the patient has consented to  Procedure(s): COLONOSCOPY ([name]) as a surgical intervention .  The patient's history has been reviewed, patient examined, no change in status, stable for surgery.  I have reviewed the patient's chart and labs.  Questions were answered to the patient's satisfaction.     Scarlette Shorts

## 2013-12-02 NOTE — Progress Notes (Signed)
Discharge given to pt and Daughter. Pt refused to take this am meds . From endoscopy . Pt's daughter and instructed  Verbalized understanding. Discharged with daughter

## 2013-12-03 ENCOUNTER — Encounter (HOSPITAL_COMMUNITY): Payer: Self-pay | Admitting: Internal Medicine

## 2013-12-03 ENCOUNTER — Emergency Department (HOSPITAL_COMMUNITY)
Admission: EM | Admit: 2013-12-03 | Discharge: 2013-12-03 | Disposition: A | Discharge status: Home / Self Care | Payer: Medicare Other | Attending: Emergency Medicine | Admitting: Emergency Medicine

## 2013-12-03 ENCOUNTER — Ambulatory Visit: Payer: Medicare Other | Admitting: Pharmacist Clinician (PhC)/ Clinical Pharmacy Specialist

## 2013-12-03 DIAGNOSIS — J449 Chronic obstructive pulmonary disease, unspecified: Secondary | ICD-10-CM | POA: Insufficient documentation

## 2013-12-03 DIAGNOSIS — Z79899 Other long term (current) drug therapy: Secondary | ICD-10-CM | POA: Diagnosis not present

## 2013-12-03 DIAGNOSIS — M129 Arthropathy, unspecified: Secondary | ICD-10-CM | POA: Diagnosis not present

## 2013-12-03 DIAGNOSIS — E119 Type 2 diabetes mellitus without complications: Secondary | ICD-10-CM | POA: Insufficient documentation

## 2013-12-03 DIAGNOSIS — I10 Essential (primary) hypertension: Secondary | ICD-10-CM | POA: Insufficient documentation

## 2013-12-03 DIAGNOSIS — K649 Unspecified hemorrhoids: Secondary | ICD-10-CM

## 2013-12-03 DIAGNOSIS — K625 Hemorrhage of anus and rectum: Secondary | ICD-10-CM | POA: Insufficient documentation

## 2013-12-03 DIAGNOSIS — M47812 Spondylosis without myelopathy or radiculopathy, cervical region: Secondary | ICD-10-CM | POA: Diagnosis not present

## 2013-12-03 DIAGNOSIS — Z794 Long term (current) use of insulin: Secondary | ICD-10-CM | POA: Diagnosis not present

## 2013-12-03 DIAGNOSIS — Z86718 Personal history of other venous thrombosis and embolism: Secondary | ICD-10-CM | POA: Diagnosis not present

## 2013-12-03 DIAGNOSIS — E785 Hyperlipidemia, unspecified: Secondary | ICD-10-CM | POA: Diagnosis not present

## 2013-12-03 DIAGNOSIS — Z7901 Long term (current) use of anticoagulants: Secondary | ICD-10-CM | POA: Diagnosis not present

## 2013-12-03 DIAGNOSIS — K644 Residual hemorrhoidal skin tags: Secondary | ICD-10-CM | POA: Insufficient documentation

## 2013-12-03 DIAGNOSIS — F17200 Nicotine dependence, unspecified, uncomplicated: Secondary | ICD-10-CM | POA: Diagnosis not present

## 2013-12-03 DIAGNOSIS — I2510 Atherosclerotic heart disease of native coronary artery without angina pectoris: Secondary | ICD-10-CM | POA: Insufficient documentation

## 2013-12-03 DIAGNOSIS — Z7982 Long term (current) use of aspirin: Secondary | ICD-10-CM | POA: Diagnosis not present

## 2013-12-03 DIAGNOSIS — J4489 Other specified chronic obstructive pulmonary disease: Secondary | ICD-10-CM | POA: Insufficient documentation

## 2013-12-03 DIAGNOSIS — I4891 Unspecified atrial fibrillation: Secondary | ICD-10-CM | POA: Diagnosis not present

## 2013-12-03 DIAGNOSIS — I5032 Chronic diastolic (congestive) heart failure: Secondary | ICD-10-CM | POA: Diagnosis not present

## 2013-12-03 LAB — CBC WITH DIFFERENTIAL/PLATELET
Basophils Absolute: 0 10*3/uL (ref 0.0–0.1)
Basophils Relative: 1 % (ref 0–1)
Eosinophils Absolute: 0.2 10*3/uL (ref 0.0–0.7)
Eosinophils Relative: 3 % (ref 0–5)
HCT: 39.8 % (ref 39.0–52.0)
Hemoglobin: 13.1 g/dL (ref 13.0–17.0)
LYMPHS ABS: 1.8 10*3/uL (ref 0.7–4.0)
LYMPHS PCT: 27 % (ref 12–46)
MCH: 28.6 pg (ref 26.0–34.0)
MCHC: 32.9 g/dL (ref 30.0–36.0)
MCV: 86.9 fL (ref 78.0–100.0)
MONOS PCT: 12 % (ref 3–12)
Monocytes Absolute: 0.8 10*3/uL (ref 0.1–1.0)
NEUTROS ABS: 3.8 10*3/uL (ref 1.7–7.7)
NEUTROS PCT: 57 % (ref 43–77)
PLATELETS: 240 10*3/uL (ref 150–400)
RBC: 4.58 MIL/uL (ref 4.22–5.81)
RDW: 14.2 % (ref 11.5–15.5)
WBC: 6.5 10*3/uL (ref 4.0–10.5)

## 2013-12-03 LAB — ABO/RH: ABO/RH(D): A NEG

## 2013-12-03 LAB — COMPREHENSIVE METABOLIC PANEL
ALK PHOS: 84 U/L (ref 39–117)
ALT: 51 U/L (ref 0–53)
ANION GAP: 13 (ref 5–15)
AST: 29 U/L (ref 0–37)
Albumin: 3.3 g/dL — ABNORMAL LOW (ref 3.5–5.2)
BUN: 12 mg/dL (ref 6–23)
CHLORIDE: 102 meq/L (ref 96–112)
CO2: 21 meq/L (ref 19–32)
Calcium: 9.3 mg/dL (ref 8.4–10.5)
Creatinine, Ser: 0.93 mg/dL (ref 0.50–1.35)
GFR, EST NON AFRICAN AMERICAN: 81 mL/min — AB (ref >90)
GLUCOSE: 162 mg/dL — AB (ref 70–99)
POTASSIUM: 4.1 meq/L (ref 3.7–5.3)
SODIUM: 136 meq/L — AB (ref 137–147)
Total Protein: 7 g/dL (ref 6.0–8.3)

## 2013-12-03 LAB — TYPE AND SCREEN
ABO/RH(D): A NEG
Antibody Screen: NEGATIVE

## 2013-12-03 LAB — PROTIME-INR
INR: 1 (ref 0.00–1.49)
Prothrombin Time: 13.2 seconds (ref 11.6–15.2)

## 2013-12-03 MED ORDER — SODIUM CHLORIDE 0.9 % IV BOLUS (SEPSIS)
500.0000 mL | Freq: Once | INTRAVENOUS | Status: AC
  Administered 2013-12-03: 500 mL via INTRAVENOUS

## 2013-12-03 NOTE — ED Notes (Signed)
Bed: EM33 Expected date:  Expected time:  Means of arrival:  Comments: EMS rectal bleeding

## 2013-12-03 NOTE — ED Provider Notes (Signed)
TIME SEEN: 8:15 PM  CHIEF COMPLAINT: bright red blood per rectum  HPI: Patient is a 73 -year-old male with history of a drug or relation on Coumadin, CHF, hypertension, diabetes, hyperlipidemia presents to the emergency department with bright red blood per rectum that started at 3 PM today with a bowel movement. He is having some mild rectal pain. He is admitted to the hospital 11/30/12 for rectal bleeding and had a hemoglobin of 7. Colonoscopy on 9/8 by Dr. Henrene Pastor revealed hemorrhoids. Patient has restarted his Coumadin. Denies any fevers, chills, vomiting, melena, abdominal pain, chest pain or shortness of breath, lightheadedness.  ROS: See HPI Constitutional: no fever  Eyes: no drainage  ENT: no runny nose   Cardiovascular:  no chest pain  Resp: no SOB  GI: no vomiting GU: no dysuria Integumentary: no rash  Allergy: no hives  Musculoskeletal: no leg swelling  Neurological: no slurred speech ROS otherwise negative  PAST MEDICAL HISTORY/PAST SURGICAL HISTORY:  Past Medical History  Diagnosis Date  . Atrial flutter 08/05/2010    Status post caval tricuspid isthmus ablation by Dr. [name]  . PAF (paroxysmal atrial fibrillation) 2012    Recurrent after atrial flutter, currently controlled on flecainide plus diltiazem  . Diabetes mellitus type II     On metformin and insulin  . Hyperlipidemia   . HTN (hypertension)   . Diastolic CHF, chronic     preserved EF 55%; exacerbated by A. fib/A. flutter  . Coronary artery disease, non-occlusive January 2012    Nonocclusive disease by cath, performed for ST elevations on ECG.  . History of DVT (deep vein thrombosis)     on warfarin  . Tobacco abuse     Initially quit, now back to smoking.  [name] COPD (chronic obstructive pulmonary disease)   . Cholecystitis, acute January 2012    Status post cholecystectomy preceded by T-tube,   . Cervical radiculopathy due to degenerative joint disease of spine   . Claudication     lower ext dopplers  08/18/11-normal ABIs bilaterally with normal triphasic waveforms  . Arthritis     MEDICATIONS:  Prior to Admission medications   Medication Sig Start Date End Date Taking? Authorizing Provider  aspirin 81 MG chewable tablet Chew 81 mg by mouth daily.   Yes Historical Provider, MD  diltiazem (CARDIZEM CD) 240 MG 24 hr capsule Take 240 mg by mouth daily.   Yes Historical Provider, MD  ferrous sulfate 325 (65 FE) MG tablet Take 325 mg by mouth daily with breakfast.   Yes Historical Provider, MD  flecainide (TAMBOCOR) 150 MG tablet Take 225 mg by mouth 2 (two) times daily.   Yes Historical Provider, MD  insulin glargine (LANTUS SOLOSTAR) 100 UNIT/ML injection Inject 12 Units into the skin 3 (three) times daily as needed (blood sugar).    Yes Historical Provider, MD  loratadine (CLARITIN) 10 MG tablet Take 10 mg by mouth every morning.    Yes Historical Provider, MD  metFORMIN (GLUCOPHAGE) 1000 MG tablet Take 1,000 mg by mouth 2 (two) times daily with a meal.    Yes Historical Provider, MD  metoprolol succinate (TOPROL-XL) 50 MG 24 hr tablet Take 50 mg by mouth 2 (two) times daily. Take with or immediately following a meal.   Yes Historical Provider, MD  nitroGLYCERIN (NITROSTAT) 0.4 MG SL tablet Place 0.4 mg under the tongue every 5 (five) minutes x 3 doses as needed. For chest pain.   Yes Historical Provider, MD  pravastatin (PRAVACHOL) 40 MG tablet  Take 40 mg by mouth daily.   Yes Historical Provider, MD  warfarin (COUMADIN) 5 MG tablet Take 5-7.5 mg by mouth daily. Take 5mg  on Tuesday, Wednesday, Thursday, Saturday and Sunday.  Take 7.5mg  on Monday and Friday   Yes Historical Provider, MD  nicotine (NICODERM CQ) 21 mg/24hr patch Place 1 patch (21 mg total) onto the skin daily. 12/02/13   Phillips Climes, MD    ALLERGIES:  No Known Allergies  SOCIAL HISTORY:  History  Substance Use Topics  . Smoking status: Current Every Day Smoker -- 0.50 packs/day for 50 years    Types: Cigarettes  .  Smokeless tobacco: Never Used  . Alcohol Use: No    FAMILY HISTORY: Family History  Problem Relation Age of Onset  . Cancer Mother   . Heart attack Father   . Heart attack Brother     EXAM: BP 100/85  Pulse 79  Temp(Src) 98.1 F (36.7 C) (Oral)  Resp 22  SpO2 95% CONSTITUTIONAL: Alert and oriented and responds appropriately to questions. Well-appearing; well-nourished HEAD: Normocephalic EYES: Conjunctivae clear, PERRL ENT: normal nose; no rhinorrhea; moist mucous membranes; pharynx without lesions noted NECK: Supple, no meningismus, no LAD  CARD: RRR; S1 and S2 appreciated; no murmurs, no clicks, no rubs, no gallops RESP: Normal chest excursion without splinting or tachypnea; breath sounds clear and equal bilaterally; no wheezes, no rhonchi, no rales,  ABD/GI: Normal bowel sounds; non-distended; soft, non-tender, no rebound, no guarding RECTAL:  Patient has a large nonthrombosed external hemorrhoid is not actively bleeding, there is a small amount of gross red blood in his rectum, no melena, normal rectal exam, no. Rectal erythema or warmth or fluctuance BACK:  The back appears normal and is non-tender to palpation, there is no CVA tenderness EXT: Normal ROM in all joints; non-tender to palpation; no edema; normal capillary refill; no cyanosis    SKIN: Normal color for age and race; warm NEURO: Moves all extremities equally PSYCH: The patient's mood and manner are appropriate. Grooming and personal hygiene are appropriate.  MEDICAL DECISION MAKING: Patient here with her that the rectum. Likely secondary to his hemorrhoids. She is hemodynamically stable. We'll check hemoglobin, INR.  ED PROGRESS: Patient's hemoglobin today is 13.1, INR 1.0. He is still hemodynamically stable. Repeat blood pressure was 113/73 with a heart rate of 63. He has no complaints. No current rectal bleeding. We'll discharge home with supportive care instructions and return precautions. Patient verbalizes  understanding and is comfortable with plan.     [place], DO 12/03/13 2134

## 2013-12-03 NOTE — ED Notes (Signed)
Per EMS: Pt has been noticing streaks of bright red blood with stool onset 1500 today. Rectal pain. Hx of hemorrhoids. Denies abdominal pain. Denies N/V/D. Pt is on blood thinners (coumadin). NSR on monitor.

## 2013-12-03 NOTE — Discharge Instructions (Signed)

## 2013-12-04 ENCOUNTER — Telehealth: Payer: Self-pay | Admitting: Pharmacist Clinician (PhC)/ Clinical Pharmacy Specialist

## 2013-12-04 NOTE — Telephone Encounter (Signed)
Pt daughter called this am.  [facility], stating Mr Staffieri had been in hospital and now discharged.  Wanted to know what to do about warfarin.  Returned call LMOM.  Pt was hospitalized for lower GI bleed (hemorrhoids), now d/c and improving.  Advised dtr to continue standard dose, no extra doses, as pt was still having some bleeding after discharge.  Would prefer that INR increase to therapeutic gradually so area can heal.

## 2013-12-05 IMAGING — CR DG SHOULDER 2+V*L*
2 series · 2 of 2 positions shown · non-contrast
Comparison: None.

CLINICAL DATA: Fall, left shoulder pain

LEFT SHOULDER - 2+ VIEW

[w shoulder ap internal left]
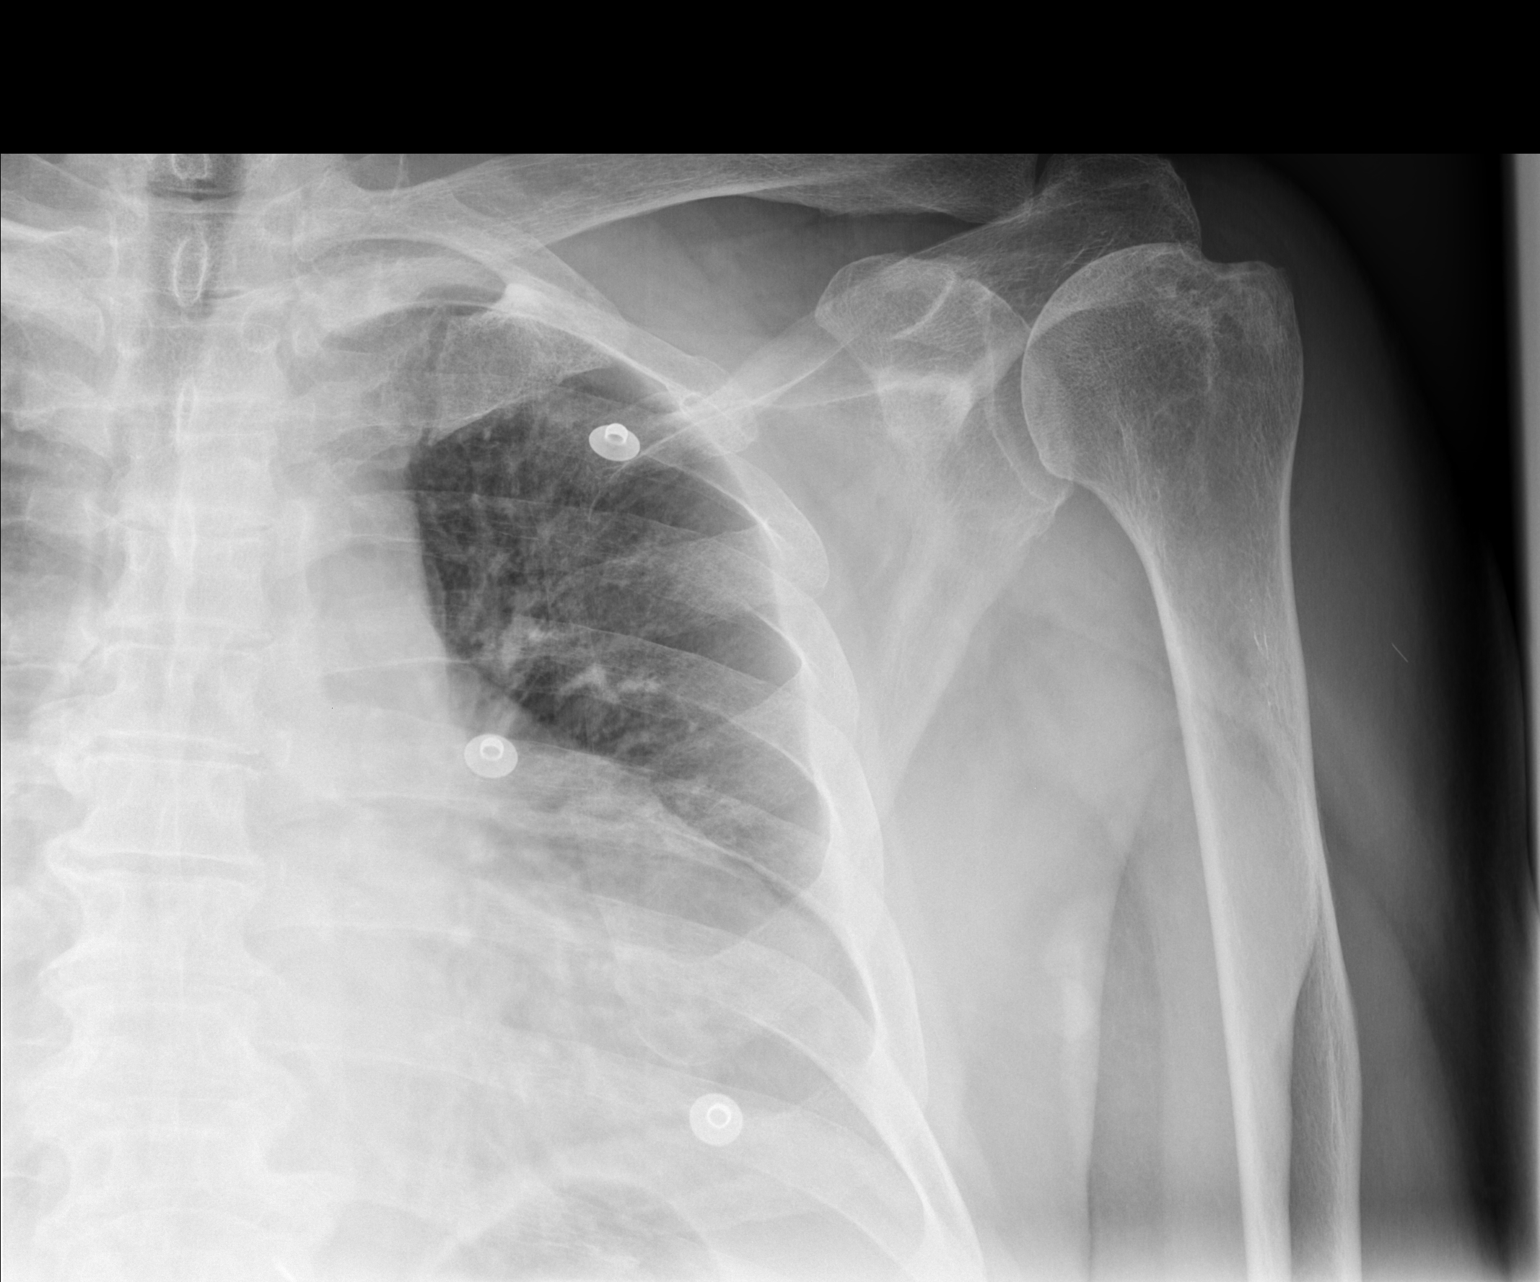

[w shoulder y view left]
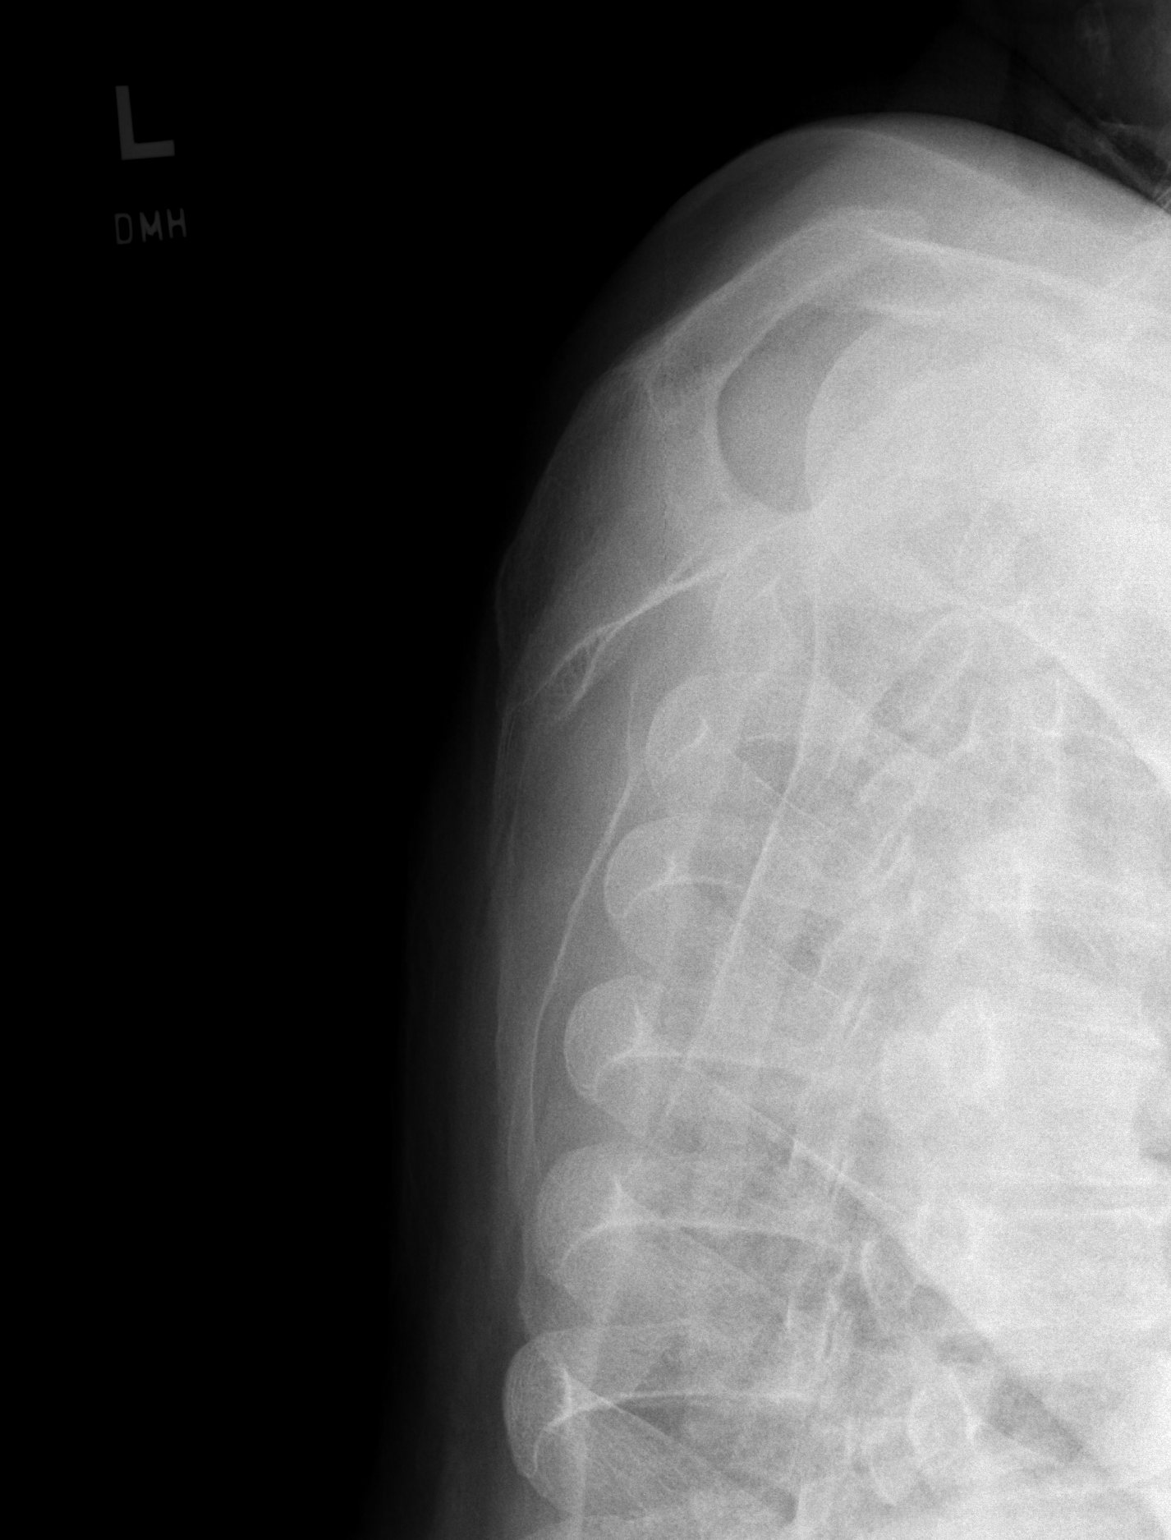

[2 of 2 positions shown; findings below may reference images not displayed]

FINDINGS: No fracture or dislocation is seen.

Sclerosis along the posterolateral humeral head may reflect a prior
Hill-Sachs deformity.

The joint spaces are preserved.

The visualized soft tissues are unremarkable.

Visualized left lung is essentially clear.
IMPRESSION: No fracture or dislocation is seen.

## 2013-12-26 ENCOUNTER — Ambulatory Visit (INDEPENDENT_AMBULATORY_CARE_PROVIDER_SITE_OTHER): Payer: Medicare Other | Admitting: Pharmacist Clinician (PhC)/ Clinical Pharmacy Specialist

## 2013-12-26 DIAGNOSIS — I480 Paroxysmal atrial fibrillation: Secondary | ICD-10-CM

## 2013-12-26 DIAGNOSIS — Z7901 Long term (current) use of anticoagulants: Secondary | ICD-10-CM

## 2013-12-26 LAB — POCT INR: INR: 1.8

## 2013-12-31 ENCOUNTER — Telehealth: Payer: Self-pay | Admitting: *Deleted

## 2013-12-31 NOTE — Telephone Encounter (Signed)
Faxed  Clearance for surgery from a cardiac standpoint- bilateral carpal tunnel release with [facility] Per Dr [name] , okay to stop warfarin  5 days pre-op then restart day #1 post -op

## 2014-01-07 ENCOUNTER — Other Ambulatory Visit (HOSPITAL_COMMUNITY): Payer: Self-pay | Admitting: Orthopaedic Surgery

## 2014-01-08 ENCOUNTER — Encounter (HOSPITAL_COMMUNITY): Payer: Self-pay | Admitting: Emergency Medicine

## 2014-01-08 ENCOUNTER — Observation Stay (HOSPITAL_COMMUNITY)
Admission: EM | Admit: 2014-01-08 | Discharge: 2014-01-09 | Disposition: A | Discharge status: Home / Self Care | Payer: Medicare Other | Attending: Internal Medicine | Admitting: Internal Medicine

## 2014-01-08 ENCOUNTER — Emergency Department (HOSPITAL_COMMUNITY): Payer: Medicare Other

## 2014-01-08 DIAGNOSIS — R05 Cough: Secondary | ICD-10-CM | POA: Insufficient documentation

## 2014-01-08 DIAGNOSIS — Z720 Tobacco use: Secondary | ICD-10-CM | POA: Diagnosis not present

## 2014-01-08 DIAGNOSIS — R0789 Other chest pain: Secondary | ICD-10-CM | POA: Diagnosis not present

## 2014-01-08 DIAGNOSIS — Z7901 Long term (current) use of anticoagulants: Secondary | ICD-10-CM

## 2014-01-08 DIAGNOSIS — Z23 Encounter for immunization: Secondary | ICD-10-CM | POA: Insufficient documentation

## 2014-01-08 DIAGNOSIS — Z86718 Personal history of other venous thrombosis and embolism: Secondary | ICD-10-CM | POA: Diagnosis not present

## 2014-01-08 DIAGNOSIS — E1165 Type 2 diabetes mellitus with hyperglycemia: Secondary | ICD-10-CM | POA: Insufficient documentation

## 2014-01-08 DIAGNOSIS — I480 Paroxysmal atrial fibrillation: Secondary | ICD-10-CM | POA: Insufficient documentation

## 2014-01-08 DIAGNOSIS — Z5181 Encounter for therapeutic drug level monitoring: Secondary | ICD-10-CM

## 2014-01-08 DIAGNOSIS — I10 Essential (primary) hypertension: Secondary | ICD-10-CM | POA: Diagnosis not present

## 2014-01-08 DIAGNOSIS — E785 Hyperlipidemia, unspecified: Secondary | ICD-10-CM | POA: Insufficient documentation

## 2014-01-08 DIAGNOSIS — R079 Chest pain, unspecified: Secondary | ICD-10-CM | POA: Diagnosis present

## 2014-01-08 DIAGNOSIS — J449 Chronic obstructive pulmonary disease, unspecified: Secondary | ICD-10-CM | POA: Insufficient documentation

## 2014-01-08 DIAGNOSIS — I5032 Chronic diastolic (congestive) heart failure: Secondary | ICD-10-CM | POA: Diagnosis present

## 2014-01-08 DIAGNOSIS — F17200 Nicotine dependence, unspecified, uncomplicated: Secondary | ICD-10-CM | POA: Diagnosis present

## 2014-01-08 LAB — BASIC METABOLIC PANEL
Anion gap: 15 (ref 5–15)
BUN: 13 mg/dL (ref 6–23)
CHLORIDE: 100 meq/L (ref 96–112)
CO2: 23 meq/L (ref 19–32)
Calcium: 9.4 mg/dL (ref 8.4–10.5)
Creatinine, Ser: 0.95 mg/dL (ref 0.50–1.35)
GFR calc non Af Amer: 80 mL/min — ABNORMAL LOW (ref >90)
Glucose, Bld: 144 mg/dL — ABNORMAL HIGH (ref 70–99)
POTASSIUM: 4.4 meq/L (ref 3.7–5.3)
SODIUM: 138 meq/L (ref 137–147)

## 2014-01-08 LAB — CBC
HCT: 46.4 % (ref 39.0–52.0)
Hemoglobin: 15.4 g/dL (ref 13.0–17.0)
MCH: 29.2 pg (ref 26.0–34.0)
MCHC: 33.2 g/dL (ref 30.0–36.0)
MCV: 88 fL (ref 78.0–100.0)
PLATELETS: 247 10*3/uL (ref 150–400)
RBC: 5.27 MIL/uL (ref 4.22–5.81)
RDW: 13.7 % (ref 11.5–15.5)
WBC: 6.9 10*3/uL (ref 4.0–10.5)

## 2014-01-08 LAB — GLUCOSE, CAPILLARY
GLUCOSE-CAPILLARY: 128 mg/dL — AB (ref 70–99)
Glucose-Capillary: 128 mg/dL — ABNORMAL HIGH (ref 70–99)
Glucose-Capillary: 138 mg/dL — ABNORMAL HIGH (ref 70–99)

## 2014-01-08 LAB — PROTIME-INR
INR: 1.92 — ABNORMAL HIGH (ref 0.00–1.49)
PROTHROMBIN TIME: 22.1 s — AB (ref 11.6–15.2)

## 2014-01-08 LAB — PRO B NATRIURETIC PEPTIDE: PRO B NATRI PEPTIDE: 105.2 pg/mL (ref 0–125)

## 2014-01-08 LAB — CBG MONITORING, ED: Glucose-Capillary: 102 mg/dL — ABNORMAL HIGH (ref 70–99)

## 2014-01-08 LAB — TROPONIN I
Troponin I: <0.3 ng/mL (ref <0.30)
Troponin I: <0.3 ng/mL (ref <0.30)

## 2014-01-08 LAB — I-STAT TROPONIN, ED: TROPONIN I, POC: 0.02 ng/mL (ref 0.00–0.08)

## 2014-01-08 MED ORDER — LORATADINE 10 MG PO TABS
10.0000 mg | ORAL_TABLET | Freq: Every day | ORAL | Status: DC
  Filled 2014-01-08: qty 1

## 2014-01-08 MED ORDER — WARFARIN SODIUM 7.5 MG PO TABS
7.5000 mg | ORAL_TABLET | Freq: Once | ORAL | Status: AC
  Administered 2014-01-08: 7.5 mg via ORAL
  Filled 2014-01-08: qty 1

## 2014-01-08 MED ORDER — SODIUM CHLORIDE 0.9 % IJ SOLN
3.0000 mL | Freq: Two times a day (BID) | INTRAMUSCULAR | Status: DC
  Administered 2014-01-08 – 2014-01-09 (×3): 3 mL via INTRAVENOUS

## 2014-01-08 MED ORDER — FERROUS SULFATE 325 (65 FE) MG PO TABS
325.0000 mg | ORAL_TABLET | Freq: Every day | ORAL | Status: DC
  Filled 2014-01-08 (×2): qty 1

## 2014-01-08 MED ORDER — ASPIRIN 81 MG PO CHEW
324.0000 mg | CHEWABLE_TABLET | Freq: Once | ORAL | Status: AC
  Administered 2014-01-08: 324 mg via ORAL
  Filled 2014-01-08: qty 4

## 2014-01-08 MED ORDER — WARFARIN - PHARMACIST DOSING INPATIENT: Freq: Every day | Status: DC

## 2014-01-08 MED ORDER — INSULIN ASPART 100 UNIT/ML ~~LOC~~ SOLN
0.0000 [IU] | Freq: Three times a day (TID) | SUBCUTANEOUS | Status: DC
  Administered 2014-01-08 – 2014-01-09 (×3): 1 [IU] via SUBCUTANEOUS

## 2014-01-08 MED ORDER — INFLUENZA VAC SPLIT QUAD 0.5 ML IM SUSY
0.5000 mL | PREFILLED_SYRINGE | INTRAMUSCULAR | Status: AC
  Administered 2014-01-09: 0.5 mL via INTRAMUSCULAR
  Filled 2014-01-08: qty 0.5

## 2014-01-08 MED ORDER — ACETAMINOPHEN 650 MG RE SUPP: 650.0000 mg | Freq: Four times a day (QID) | RECTAL | Status: DC | PRN

## 2014-01-08 MED ORDER — PRAVASTATIN SODIUM 40 MG PO TABS
40.0000 mg | ORAL_TABLET | Freq: Every day | ORAL | Status: DC
  Administered 2014-01-08: 40 mg via ORAL
  Filled 2014-01-08 (×2): qty 1

## 2014-01-08 MED ORDER — ACETAMINOPHEN 325 MG PO TABS: 650.0000 mg | ORAL_TABLET | Freq: Four times a day (QID) | ORAL | Status: DC | PRN

## 2014-01-08 MED ORDER — FLECAINIDE ACETATE 50 MG PO TABS
225.0000 mg | ORAL_TABLET | Freq: Two times a day (BID) | ORAL | Status: DC
  Administered 2014-01-08 – 2014-01-09 (×2): 225 mg via ORAL
  Filled 2014-01-08 (×3): qty 1

## 2014-01-08 MED ORDER — DILTIAZEM HCL ER COATED BEADS 240 MG PO CP24
240.0000 mg | ORAL_CAPSULE | Freq: Every day | ORAL | Status: DC
  Administered 2014-01-09: 240 mg via ORAL
  Filled 2014-01-08: qty 1

## 2014-01-08 MED ORDER — HEPARIN SODIUM (PORCINE) 5000 UNIT/ML IJ SOLN: 5000.0000 [IU] | Freq: Three times a day (TID) | INTRAMUSCULAR | Status: DC

## 2014-01-08 MED ORDER — HYDROCODONE-ACETAMINOPHEN 5-325 MG PO TABS
1.0000 | ORAL_TABLET | Freq: Four times a day (QID) | ORAL | Status: DC | PRN
  Administered 2014-01-08: 1 via ORAL
  Administered 2014-01-09: 2 via ORAL
  Filled 2014-01-08: qty 1
  Filled 2014-01-08: qty 2

## 2014-01-08 MED ORDER — NITROGLYCERIN 0.4 MG SL SUBL: 0.4000 mg | SUBLINGUAL_TABLET | SUBLINGUAL | Status: DC | PRN

## 2014-01-08 MED ORDER — DOCUSATE SODIUM 100 MG PO CAPS
100.0000 mg | ORAL_CAPSULE | Freq: Two times a day (BID) | ORAL | Status: DC | PRN
  Administered 2014-01-08: 100 mg via ORAL
  Filled 2014-01-08 (×2): qty 1

## 2014-01-08 MED ORDER — METOPROLOL SUCCINATE ER 50 MG PO TB24
50.0000 mg | ORAL_TABLET | Freq: Two times a day (BID) | ORAL | Status: DC
  Administered 2014-01-08 – 2014-01-09 (×2): 50 mg via ORAL
  Filled 2014-01-08 (×4): qty 1

## 2014-01-08 MED ORDER — ASPIRIN 81 MG PO CHEW
81.0000 mg | CHEWABLE_TABLET | Freq: Every day | ORAL | Status: DC
  Administered 2014-01-09: 81 mg via ORAL
  Filled 2014-01-08: qty 1

## 2014-01-08 MED ORDER — LORATADINE 10 MG PO TABS
10.0000 mg | ORAL_TABLET | Freq: Every day | ORAL | Status: DC
  Administered 2014-01-09: 10 mg via ORAL
  Filled 2014-01-08: qty 1

## 2014-01-08 NOTE — ED Provider Notes (Signed)
Pt with L sided CP - onset at 3 AM, on exam had clear heart and lung sounds, no edema or JVD, no change to ECG but is not normal at baseline.  Recent stress in April, on Coumadin, labs, d/w Cardiology re: dispo.    EKG Interpretation  Date/Time:  Thursday January 08 2014 09:07:42 EDT Ventricular Rate:  75 PR Interval:  332 QRS Duration: 110 QT Interval:  410 QTC Calculation: 457 R Axis:   -44 Text Interpretation:  Sinus rhythm with 1st degree A-V block Left axis deviation Cannot rule out Anterior infarct , age undetermined Abnormal ECG Since last tracing no significant changes are seen Confirmed by Sabra Heck  MD, Tamyrah Burbage ([id number]) on 01/08/2014 9:20:30 AM      Medical screening examination/treatment/procedure(s) were conducted as a shared visit with non-physician practitioner(s) and myself.  I personally evaluated the patient during the encounter.  Clinical Impression:   Final diagnoses:  Chest pain, unspecified chest pain type         Johnna Acosta, MD 01/08/14 2050

## 2014-01-08 NOTE — ED Notes (Signed)
Dr. Loreta Ave at the bedside.
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
## 2014-01-08 NOTE — Progress Notes (Signed)
Occupational Therapy Evaluation Patient Details Name: [name] MRN: [id number] DOB: 1940/05/30 Today's Date: 01/08/2014    History of Present Illness Patient is a 73 y/o male admitted for left sided chest pain that awoke him from sleep associated with dizziness, nausea and SOB. CHest XRAY- (-). PMH of A flutter, HTN, DM, PAF, CHF, CAD, DVT and COPD.   Clinical Impression   PTA pt lived at home and was independent with ADLs. Pt overall at Supervision level for ADLs and functional mobility. PT plans to address mild balance deficits with DME trial. No further acute OT needs. Pt interested in 3N1 vs. Shower seat for increased safety with bathing.     Follow Up Recommendations  No OT follow up;Supervision - Intermittent    Equipment Recommendations  Tub/shower seat;3 in 1 bedside comode;Other (comment) (pt would like to see options)    Recommendations for Other Services       Precautions / Restrictions Precautions Precautions: Fall Restrictions Weight Bearing Restrictions: No      Mobility Bed Mobility Overal bed mobility: Modified Independent;Needs Assistance Bed Mobility: Supine to Sit     Supine to sit: Modified independent (Device/Increase time);HOB elevated     General bed mobility comments: Use of rails.  Transfers Overall transfer level: Needs assistance Equipment used: None Transfers: Sit to/from Stand Sit to Stand: Supervision         General transfer comment: Supervision for safety.     Balance Overall balance assessment: Needs assistance   Sitting balance-Leahy Scale: Good Sitting balance - Comments: Able to weight shift and reach outside BOs without LOB or difficulty.    Standing balance support: During functional activity Standing balance-Leahy Scale: Fair                              ADL Overall ADL's : Needs assistance/impaired                                       General ADL Comments: Pt overall at  Supervision for functional mobility and ADLs. Pt able to reach Bil socks to don/doff. Discussed sitting on shower seat for bathing and pt agreed and is interested in shower seat. Answered pt's questions regarding safety with ADLs. PT plans to address improved mobility with use of DME. No further acute OT needs.      Vision  Pt reports no change from baseline.                    Perception Perception Perception Tested?: No   Praxis Praxis Praxis tested?: Within functional limits    Pertinent Vitals/Pain Pain Assessment: No/denies pain (discomfort in back)     Hand Dominance Right   Extremity/Trunk Assessment Upper Extremity Assessment Upper Extremity Assessment: Generalized weakness (Pt with shoulder OA)   Lower Extremity Assessment Lower Extremity Assessment: Defer to PT evaluation   Cervical / Trunk Assessment Cervical / Trunk Assessment: Normal   Communication Communication Communication: No difficulties   Cognition Arousal/Alertness: Awake/alert Behavior During Therapy: WFL for tasks assessed/performed Overall Cognitive Status: Within Functional Limits for tasks assessed                                Home Living Family/patient expects to be discharged to:: Private residence Living Arrangements: Children;Other relatives Available Help  at Discharge: Family;Available PRN/intermittently (daughter and son work nights) Type of Home: House Home Access: Stairs to enter Technical brewer of Steps: 3 Entrance Stairs-Rails: Can reach both Home Layout: One level     Bathroom Shower/Tub: Tub/shower unit Shower/tub characteristics: Architectural technologist: Standard     Home Equipment: None          Prior Functioning/Environment Level of Independence: Independent             OT Diagnosis: Generalized weakness;Acute pain                          End of Session  Activity Tolerance: Patient tolerated treatment well Patient left:  in bed;with call bell/phone within reach   Time: 8366-2947 OT Time Calculation (min): 19 min Charges:  OT General Charges $OT Visit: 1 Procedure OT Evaluation $Initial OT Evaluation Tier I: 1 Procedure OT Treatments $Self Care/Home Management : 8-22 mins G-Codes: OT G-codes **NOT FOR INPATIENT CLASS** Functional Assessment Tool Used: clinical judgment Functional Limitation: Self care Self Care Current Status (M5465): At least 1 percent but less than 20 percent impaired, limited or restricted Self Care Goal Status (K3546): At least 1 percent but less than 20 percent impaired, limited or restricted Self Care Discharge Status [phone]): At least 1 percent but less than 20 percent impaired, limited or restricted  Juluis Rainier 01/08/2014, 5:32 PM  Cyndie Chime, OTR/L Occupational Therapist [phone] (pager)
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
## 2014-01-08 NOTE — ED Notes (Signed)
Floor called room is ready for patient.

## 2014-01-08 NOTE — ED Notes (Signed)
Pt back from x-ray.

## 2014-01-08 NOTE — ED Notes (Signed)
Reports left sided chest since 0300 this morning, is dizzy and feels nauseated. Reports he feels SOB. Skin is warm and dry. Has cardiac hx

## 2014-01-08 NOTE — Progress Notes (Signed)
ANTICOAGULATION CONSULT NOTE - Initial Consult  Pharmacy Consult for coumadin Indication: atrial fibrillation  No Known Allergies  Patient Measurements: Height: 5\' 6"  (167.6 cm) Weight: 189 lb 2.5 oz (85.8 kg) IBW/kg (Calculated) : 63.8   Vital Signs: Temp: 97.8 F (36.6 C) (10/15 1310) Temp Source: Oral (10/15 1310) BP: 116/52 mmHg (10/15 1310) Pulse Rate: 61 (10/15 1310)  Labs:  Recent Labs  01/08/14 0930  HGB 15.4  HCT 46.4  PLT 247  LABPROT 22.1*  INR 1.92*  CREATININE 0.95    Estimated Creatinine Clearance: 70.1 ml/min (by C-G formula based on Cr of 0.95).   Medical History: Past Medical History  Diagnosis Date  . Atrial flutter 08/05/2010    Status post caval tricuspid isthmus ablation by Dr. [name]  . PAF (paroxysmal atrial fibrillation) 2012    Recurrent after atrial flutter, currently controlled on flecainide plus diltiazem  . Diabetes mellitus type II     On metformin and insulin  . Hyperlipidemia   . HTN (hypertension)   . Diastolic CHF, chronic     preserved EF 55%; exacerbated by A. fib/A. flutter  . Coronary artery disease, non-occlusive January 2012    Nonocclusive disease by cath, performed for ST elevations on ECG.  . History of DVT (deep vein thrombosis)     on warfarin  . Tobacco abuse     Initially quit, now back to smoking.  [name] COPD (chronic obstructive pulmonary disease)   . Cholecystitis, acute January 2012    Status post cholecystectomy preceded by T-tube,   . Cervical radiculopathy due to degenerative joint disease of spine   . Claudication     lower ext dopplers 08/18/11-normal ABIs bilaterally with normal triphasic waveforms  . Arthritis   . Dysrhythmia     HX OF ATRIAL IFB /FLUTTER     Medications:  Prescriptions prior to admission  Medication Sig Dispense Refill  . aspirin 81 MG chewable tablet Chew 81 mg by mouth daily.      [name] diltiazem (CARDIZEM CD) 240 MG 24 hr capsule Take 240 mg by mouth daily.      . ferrous  sulfate 325 (65 FE) MG tablet Take 325 mg by mouth daily with breakfast.      . flecainide (TAMBOCOR) 150 MG tablet Take 225 mg by mouth 2 (two) times daily.      . hydrocortisone cream 1 % Apply 1 application topically 2 (two) times daily as needed for itching.      . insulin glargine (LANTUS SOLOSTAR) 100 UNIT/ML injection Inject 12 Units into the skin 2 (two) times daily as needed (blood sugar).       [name] loratadine (CLARITIN) 10 MG tablet Take 10 mg by mouth every morning.       . metFORMIN (GLUCOPHAGE) 1000 MG tablet Take 1,000 mg by mouth 2 (two) times daily with a meal.       . metoprolol succinate (TOPROL-XL) 50 MG 24 hr tablet Take 50 mg by mouth 2 (two) times daily. Take with or immediately following a meal.      . nitroGLYCERIN (NITROSTAT) 0.4 MG SL tablet Place 0.4 mg under the tongue every 5 (five) minutes x 3 doses as needed. For chest pain.      . pravastatin (PRAVACHOL) 40 MG tablet Take 40 mg by mouth daily.      [name] warfarin (COUMADIN) 5 MG tablet Take 5-7.5 mg by mouth daily. Take 5mg  on Tuesday, Thursday, Saturday and Sunday.  Take 7.5mg  on Monday,  Wednesday, and Friday        Assessment: 73 yo man admitted with CP to continue coumadin.  Home dose as above, last dose 10/14.  Admit INR 1.92 Goal of Therapy:  INR 2-3 Monitor platelets by anticoagulation protocol: Yes   Plan:  Coumadin 7.5 mg today Daily PT/INR  Thanks for allowing pharmacy to be a part of this patient's care.  Excell Seltzer, PharmD Clinical Pharmacist, [phone] 01/08/2014,2:50 PM

## 2014-01-08 NOTE — Evaluation (Signed)
Physical Therapy Evaluation Patient Details Name: [name] MRN: [id number] DOB: [birth year] Today's Date: 01/08/2014   History of Present Illness  Patient is a 73 y/o male admitted for left sided chest pain that awoke him from sleep associated with dizziness, nausea and SOB. CHest XRAY- (-). PMH of A flutter, HTN, DM, PAF, CHF, CAD, DVT and COPD.   Clinical Impression  Patient presents with functional limitations due to deficits listed in PT problem list (see below). Pt presents with balance deficits limiting safe mobility. Pt enjoys walking daily however has limited it due to feeling "top heavy" and unsteady. Pt may benefit from use of AD for support during ambulation- will assess RW vs SPC next session. Discussed importance of using AD for support to allow for increased distance and safety during ambulation. Pt would benefit from acute PT to improve balance and safe mobility so pt can reduce risk of falls and return to PLOF.    Follow Up Recommendations Supervision for mobility/OOB    Equipment Recommendations  Other (comment) (TBD.)    Recommendations for Other Services       Precautions / Restrictions Precautions Precautions: Fall Restrictions Weight Bearing Restrictions: No      Mobility  Bed Mobility Overal bed mobility: Modified Independent;Needs Assistance Bed Mobility: Supine to Sit     Supine to sit: Modified independent (Device/Increase time);HOB elevated     General bed mobility comments: Use of rails.  Transfers Overall transfer level: Needs assistance Equipment used: None Transfers: Sit to/from Stand Sit to Stand: Supervision         General transfer comment: SUpervision for safety.  Ambulation/Gait Ambulation/Gait assistance: Min guard Ambulation Distance (Feet): 200 Feet Assistive device: None Gait Pattern/deviations: Step-through pattern;Drifts right/left;Decreased stride length;Staggering left;Staggering right     General Gait Details:  Pt unsteady during gait with mild staggering r/l and drifting at times. Reports he feels "top heavy" Anterior bias noted occasionally resulting in forward momentum during gait. Dyspnea noted. No CP. Sa02 90%.  Stairs Stairs: Yes Stairs assistance: Supervision Stair Management: One rail Right;One rail Left;Alternating pattern Number of Stairs: 3 General stair comments: Safe technique. VC to take rest breaks.  Wheelchair Mobility    Modified Rankin (Stroke Patients Only)       Balance Overall balance assessment: Needs assistance   Sitting balance-Leahy Scale: Good Sitting balance - Comments: Able to weight shift and reach outside BOs without LOB or difficulty.    Standing balance support: During functional activity Standing balance-Leahy Scale: Fair                               Pertinent Vitals/Pain Pain Assessment: No/denies pain (but reports discomfort in back.)    Home Living Family/patient expects to be discharged to:: Private residence Living Arrangements: Children;Other relatives Available Help at Discharge: Family;Available PRN/intermittently (Daughter and son work nights.) Type of Home: House Home Access: Stairs to enter Entrance Stairs-Rails: Can reach both Technical brewer of Steps: 3 Home Layout: One level Home Equipment: None      Prior Function Level of Independence: Independent               Hand Dominance        Extremity/Trunk Assessment   Upper Extremity Assessment: Defer to OT evaluation           Lower Extremity Assessment: Overall WFL for tasks assessed         Communication   Communication: No difficulties  Cognition Arousal/Alertness: Awake/alert Behavior During Therapy: WFL for tasks assessed/performed Overall Cognitive Status: Within Functional Limits for tasks assessed                      General Comments      Exercises        Assessment/Plan    PT Assessment Patient needs  continued PT services  PT Diagnosis Difficulty walking   PT Problem List Decreased balance;Decreased safety awareness;Decreased activity tolerance;Cardiopulmonary status limiting activity  PT Treatment Interventions Balance training;Gait training;Stair training;Patient/family education;Functional mobility training;Therapeutic exercise;Therapeutic activities   PT Goals (Current goals can be found in the Care Plan section) Acute Rehab PT Goals Patient Stated Goal: to get my balance better PT Goal Formulation: With patient Time For Goal Achievement: 01/22/14 Potential to Achieve Goals: Good    Frequency Min 3X/week   Barriers to discharge        Co-evaluation               End of Session Equipment Utilized During Treatment: Gait belt Activity Tolerance: Patient tolerated treatment well Patient left: in bed;with call bell/phone within reach;Other (comment) (with OT in room.) Nurse Communication: Mobility status    Functional Assessment Tool Used: Clinical judgment. Functional Limitation: Mobility: Walking and moving around Mobility: Walking and Moving Around Current Status [phone]): At least 1 percent but less than 20 percent impaired, limited or restricted Mobility: Walking and Moving Around Goal Status [phone]): At least 1 percent but less than 20 percent impaired, limited or restricted    Time: 8295-6213 PT Time Calculation (min): 21 min   Charges:   PT Evaluation $Initial PT Evaluation Tier I: 1 Procedure PT Treatments $Gait Training: 8-22 mins   PT G Codes:   Functional Assessment Tool Used: Clinical judgment. Functional Limitation: Mobility: Walking and moving around    Hubbard, [place] 01/08/2014, 4:46 PM Candy Sledge, PT, DPT [phone]

## 2014-01-08 NOTE — ED Provider Notes (Signed)
I saw and evaluated the patient, reviewed the resident's note and I agree with the findings and plan.  Please see my separate note regarding my evaluation of the patient.    Johnna Acosta, MD 01/08/14 [phone]

## 2014-01-09 ENCOUNTER — Ambulatory Visit: Payer: Medicare Other | Admitting: Pharmacist Clinician (PhC)/ Clinical Pharmacy Specialist

## 2014-01-09 ENCOUNTER — Telehealth: Payer: Self-pay

## 2014-01-09 DIAGNOSIS — I10 Essential (primary) hypertension: Secondary | ICD-10-CM

## 2014-01-09 DIAGNOSIS — I5032 Chronic diastolic (congestive) heart failure: Secondary | ICD-10-CM | POA: Diagnosis not present

## 2014-01-09 DIAGNOSIS — Z23 Encounter for immunization: Secondary | ICD-10-CM | POA: Diagnosis not present

## 2014-01-09 DIAGNOSIS — R0789 Other chest pain: Secondary | ICD-10-CM | POA: Diagnosis not present

## 2014-01-09 DIAGNOSIS — E119 Type 2 diabetes mellitus without complications: Secondary | ICD-10-CM

## 2014-01-09 DIAGNOSIS — Z720 Tobacco use: Secondary | ICD-10-CM

## 2014-01-09 DIAGNOSIS — R05 Cough: Secondary | ICD-10-CM

## 2014-01-09 DIAGNOSIS — I4891 Unspecified atrial fibrillation: Secondary | ICD-10-CM

## 2014-01-09 LAB — TROPONIN I: Troponin I: <0.3 ng/mL (ref <0.30)

## 2014-01-09 LAB — PROTIME-INR
INR: 2.25 — ABNORMAL HIGH (ref 0.00–1.49)
PROTHROMBIN TIME: 25 s — AB (ref 11.6–15.2)

## 2014-01-09 LAB — GLUCOSE, CAPILLARY
Glucose-Capillary: 146 mg/dL — ABNORMAL HIGH (ref 70–99)
Glucose-Capillary: 136 mg/dL — ABNORMAL HIGH (ref 70–99)

## 2014-01-09 MED ORDER — WARFARIN SODIUM 5 MG PO TABS: 5.0000 mg | ORAL_TABLET | ORAL | Status: DC

## 2014-01-09 MED ORDER — FERROUS SULFATE 325 (65 FE) MG PO TABS
325.0000 mg | ORAL_TABLET | Freq: Every day | ORAL | Status: DC
Start: 2014-01-09 — End: 2014-01-09
  Administered 2014-01-09: 325 mg via ORAL
  Filled 2014-01-09 (×2): qty 1

## 2014-01-09 MED ORDER — WARFARIN SODIUM 7.5 MG PO TABS
7.5000 mg | ORAL_TABLET | ORAL | Status: DC
Start: 2014-01-09 — End: 2014-01-09
  Filled 2014-01-09: qty 1

## 2014-01-09 MED ORDER — HYDROCODONE-ACETAMINOPHEN 5-325 MG PO TABS: 1.0000 | ORAL_TABLET | Freq: Four times a day (QID) | ORAL | Status: DC | PRN

## 2014-01-09 NOTE — Discharge Instructions (Signed)
It was a pleasure to meet you [name]. Please continue your home medications as you take them. You will see your PCP's partner Dr Luciana Axe on 10/23 at 1 pm and Dr Allison Quarry clinic will call you to schedule a cardiology appointment as soon as possible. Please return to ED or seek medical care if you have new or worsening chest pain, shortness of breath, or other worrisome medical condition.   Chest Pain (Nonspecific) It is often hard to give a specific diagnosis for the cause of chest pain. There is always a chance that your pain could be related to something serious, such as a heart attack or a blood clot in the lungs. You need to follow up with your health care provider for further evaluation. CAUSES   Heartburn.  Pneumonia or bronchitis.  Anxiety or stress.  Inflammation around your heart (pericarditis) or lung (pleuritis or pleurisy).  A blood clot in the lung.  A collapsed lung (pneumothorax). It can develop suddenly on its own (spontaneous pneumothorax) or from trauma to the chest.  Shingles infection (herpes zoster virus). The chest wall is composed of bones, muscles, and cartilage. Any of these can be the source of the pain.  The bones can be bruised by injury.  The muscles or cartilage can be strained by coughing or overwork.  The cartilage can be affected by inflammation and become sore (costochondritis). DIAGNOSIS  Lab tests or other studies may be needed to find the cause of your pain. Your health care provider may have you take a test called an ambulatory electrocardiogram (ECG). An ECG records your heartbeat patterns over a 24-hour period. You may also have other tests, such as:  Transthoracic echocardiogram (TTE). During echocardiography, sound waves are used to evaluate how blood flows through your heart.  Transesophageal echocardiogram (TEE).  Cardiac monitoring. This allows your health care provider to monitor your heart rate and rhythm in real time.  Holter  monitor. This is a portable device that records your heartbeat and can help diagnose heart arrhythmias. It allows your health care provider to track your heart activity for several days, if needed.  Stress tests by exercise or by giving medicine that makes the heart beat faster. TREATMENT   Treatment depends on what may be causing your chest pain. Treatment may include:  Acid blockers for heartburn.  Anti-inflammatory medicine.  Pain medicine for inflammatory conditions.  Antibiotics if an infection is present.  You may be advised to change lifestyle habits. This includes stopping smoking and avoiding alcohol, caffeine, and chocolate.  You may be advised to keep your head raised (elevated) when sleeping. This reduces the chance of acid going backward from your stomach into your esophagus. Most of the time, nonspecific chest pain will improve within 2-3 days with rest and mild pain medicine.  HOME CARE INSTRUCTIONS   If antibiotics were prescribed, take them as directed. Finish them even if you start to feel better.  For the next few days, avoid physical activities that bring on chest pain. Continue physical activities as directed.  Do not use any tobacco products, including cigarettes, chewing tobacco, or electronic cigarettes.  Avoid drinking alcohol.  Only take medicine as directed by your health care provider.  Follow your health care provider's suggestions for further testing if your chest pain does not go away.  Keep any follow-up appointments you made. If you do not go to an appointment, you could develop lasting (chronic) problems with pain. If there is any problem keeping an appointment,  call to reschedule. SEEK MEDICAL CARE IF:   Your chest pain does not go away, even after treatment.  You have a rash with blisters on your chest.  You have a fever. SEEK IMMEDIATE MEDICAL CARE IF:   You have increased chest pain or pain that spreads to your arm, neck, jaw, back, or  abdomen.  You have shortness of breath.  You have an increasing cough, or you cough up blood.  You have severe back or abdominal pain.  You feel nauseous or vomit.  You have severe weakness.  You faint.  You have chills. This is an emergency. Do not wait to see if the pain will go away. Get medical help at once. Call your local emergency services (911 in [place] Do not drive yourself to the hospital. MAKE SURE YOU:   Understand these instructions.  Will watch your condition.  Will get help right away if you are not doing well or get worse. Document Released: 12/21/2004 Document Revised: 03/18/2013 Document Reviewed: 10/17/2007 [facility] Patient Information 2015 [place], [place]. This information is not intended to replace advice given to you by your health care provider. Make sure you discuss any questions you have with your health care provider.

## 2014-01-09 NOTE — Discharge Summary (Signed)
INTERNAL MEDICINE ATTENDING DISCHARGE COSIGN   I evaluated the patient on the day of discharge and discussed the discharge plan with my resident team. I agree with the discharge documentation and disposition.   [name] 01/09/2014, 5:30 PM

## 2014-01-09 NOTE — H&P (Signed)
Pt see and examined. I agree with documentation as outlined. Please refer to my note for further details

## 2014-01-09 NOTE — Progress Notes (Signed)
Pt seen and examined with Dr. Ethelene Hal. Please refer to resident note for details  Pt still complaining of intermittent Left lower rib cage pain worse on palpation. NO new complaints  Exam: Gen: AAO*3, NAD Cardio: RRR, normal heart sounds Pulm: CTA b/l Abd: soft, non tender, BS + Ext: no edema  Assessment and Plan: 73 y/o male p/w CP admitted for rule out  CP: - likely MSK in nature - troponins negative. No further w/u for now - c/w asa, statin - d/c tele  Afib: - c/w diltiazem and flecainide  DM: - resume home meds  Dispo: - stable for d/c home today

## 2014-01-09 NOTE — Telephone Encounter (Signed)
Transitional Care Clinic:  Admit date:  01/08/14 Discharge date: 01/09/14 Discharge Disposition: Home Patient contact information:  [phone]  1230-This Case Manager reviewed patient's EMR and determined patient may benefit from chronic care management services through the [facility]. Patient has had 3 hospital admissions in the last 6 months and 4 ED visits in the last 6 months. Patient has a history of coronary artery disease, atrial fibrillation, DVT, hypertension, heart failure with preserved ejection fraction, type 2 diabetes mellitus who presented to hospital on 01/08/14 with chest pain. This Case Manager met with patient to discuss the services and medical management that can be provided at the [facility]. Patient was interested in the [facility] but wanted to discuss the clinic with his daughter. Informed patient that this Case Manager would call him at home number to follow-up with him at a later time.  1600-Placed call to patient to continue discussion about Transitional Care Clinic. Spoke with family member who indicated patient resting; left message for patient requesting return call.

## 2014-01-09 NOTE — Progress Notes (Signed)
Subjective: NAEON. Patient says the pain is mostly gone but still localizes it to the L lower rib cage. He says it is reproducible with deep palpation or twisting motion of his torso. ROS otherwise negative.  Objective: Vital signs in last 24 hours: Filed Vitals:   01/09/14 0202 01/09/14 0634 01/09/14 0700 01/09/14 0954  BP: 110/84 103/64 106/50 104/56  Pulse: 66 56 58 60  Temp: 97.6 F (36.4 C) 97.6 F (36.4 C) 97.3 F (36.3 C)   TempSrc: Oral Oral Oral   Resp: 18 18 18 18   Height:      Weight:  188 lb 6.4 oz (85.458 kg)    SpO2: 98% 98% 98%    Weight change:   Intake/Output Summary (Last 24 hours) at 01/09/14 1134 Last data filed at 01/09/14 0802  Gross per 24 hour  Intake    820 ml  Output      0 ml  Net    820 ml   Gen: A&O x 4, no acute distress, well developed, well nourished HEENT: Atraumatic, PERRL, EOMI, sclerae anicteric, moist mucous membranes Neck: No carotid bruits or JVD Heart: Regular rate and rhythm, normal S1 S2, no murmurs, rubs, or gallops Lungs: Clear to auscultation bilaterally, respirations unlabored Abd: Soft, non-tender, non-distended, + bowel sounds, no hepatosplenomegaly Ext: No edema or cyanosis  Lab Results: Basic Metabolic Panel:  Recent Labs Lab 01/08/14 0930  NA 138  K 4.4  CL 100  CO2 23  GLUCOSE 144*  BUN 13  CREATININE 0.95  CALCIUM 9.4   CBC:  Recent Labs Lab 01/08/14 0930  WBC 6.9  HGB 15.4  HCT 46.4  MCV 88.0  PLT 247   Cardiac Enzymes:  Recent Labs Lab 01/08/14 1620 01/08/14 1942 01/09/14 0405  TROPONINI <0.30 <0.30 <0.30   BNP:  Recent Labs Lab 01/08/14 0930  PROBNP 105.2   D-Dimer: No results found for this basename: DDIMER,  in the last 168 hours CBG:  Recent Labs Lab 01/08/14 1201 01/08/14 1622 01/08/14 2132 01/08/14 2343 01/09/14 0616 01/09/14 1114  GLUCAP 102* 128* 128* 138* 146* 136*   Coagulation:  Recent Labs Lab 01/08/14 0930 01/09/14 0405  LABPROT 22.1* 25.0*  INR  1.92* 2.25*   Urine Drug Screen: Drugs of Abuse     Component Value Date/Time   LABOPIA NEGATIVE 03/24/2010 0350   COCAINSCRNUR NEGATIVE 03/24/2010 0350   LABBENZ NEGATIVE 03/24/2010 0350   AMPHETMU NEGATIVE 03/24/2010 0350  Misc. Labs: EKG: First degree AV block, no significant ST, T wave changes, unchanged   Studies/Results: Dg Chest 2 View  01/08/2014   CLINICAL DATA:  Chest pain.  EXAM: CHEST  2 VIEW  COMPARISON:  09/06/2013 and 04/16/2013 and 05/29/2012  FINDINGS: The heart size and mediastinal contours are within normal limits. Both lungs are clear except for a tiny area of linear scarring at the left base laterally. The visualized skeletal structures are unremarkable.  IMPRESSION: No acute disease.   Electronically Signed   By: Rozetta Nunnery M.D.   On: 01/08/2014 09:59   Medications: I have reviewed the patient's current medications. Scheduled Meds: . aspirin  81 mg Oral Daily  . diltiazem  240 mg Oral Daily  . ferrous sulfate  325 mg Oral Q breakfast  . flecainide  225 mg Oral BID  . insulin aspart  0-9 Units Subcutaneous TID WC  . loratadine  10 mg Oral Daily  . metoprolol succinate  50 mg Oral BID PC  . pravastatin  40 mg  Oral q1800  . sodium chloride  3 mL Intravenous Q12H  . [START ON 01/10/2014] warfarin  5 mg Oral Q T,Th,S,Su-1800  . warfarin  7.5 mg Oral Q M,W,F-1800  . Warfarin - Pharmacist Dosing Inpatient   Does not apply q1800   Continuous Infusions:  PRN Meds:.acetaminophen, acetaminophen, docusate sodium, HYDROcodone-acetaminophen, nitroGLYCERIN Assessment/Plan: Active Problems:   Hypertension   Chronic diastolic congestive heart failure   DM (diabetes mellitus), type 2, uncontrolled with complications   Anticoagulation goal of INR 2 to 3, for PAF    Chest pain   Tobacco use disorder  # Atypical Chest pain: Patient has a history of CAD with a catheterization in 2012 showing non-occlusive disease. Patient presents with chest pain that is non-exertional.  It is reproducible with deep palpation of the L lower rib cage and twisting of his torso. Patient reports similar though less severe symptoms for almost a year. EKG and troponins x3 not concerning for infarction. Chest x-ray unremarkable for an acute cardiopulmonary process. Called cardiology to check that they are okay with discharge if he has follow-up. - Appreciate cards - Continue sublingual nitrogen PRN  - Continue morphine PRN  - Continue home aspirin 81 mg daily  - Continue pravastatin 40 mg  - Cardiac monitoring  #Atrial fibrillation: Patient has a history of cardiac ablation is currently on diltiazem and flecainide for rate and rhythm management.  - Continue home diltiazem and flecainide   # Chronic cough: Patient reports having a history of cough for several months. It has not been productive of any significant sputum. Chest x-ray was not remarkable for any infiltrate. Patient does not seem to have any pulmonary function tests on file. Patient has not been prescribed any inhalers at home. This could represent an allergic etiology given that he is prescribed claritin. No acute issues and currently denies - May consider further workup as an outpatient.   Hypertension: Patient BP was 98-146/50-84 overnight - Continue home diltiazem, metoprolol   History of DVT: Patient is on warfarin. INR 2.25. No current calf pain. No lower extremity swelling. Patient satting well on room air.  - Continue home warfarin.   Heart failure with preserved ejection fraction: Echo from October 2014 showing 55-60% left ventricle ejection fraction, concentric hypertrophy suggesting diastolic function.  - Continue home metoprolol   Type 2 diabetes: Patient's last hemoglobin A1c was 7.0 in September 2015. Patient is on 12 units of lantus each bedtime and metformin 1000 mg twice a day. CBG 102 upon admission.  - sensitive sliding scale for now.   Tobacco use: Still smokes 0.5 packs a day.  - Encourage smoking  cessation   Dispo: Disposition is deferred at this time, awaiting improvement of current medical problems.  Anticipated discharge in approximately 0-1 day(s).   The patient does have a current PCP [name], MD) and does need an [facility] hospital follow-up appointment after discharge.  The patient does not know have transportation limitations that hinder transportation to clinic appointments.  .Services Needed at time of discharge: Y = Yes, Blank = No PT:   OT:   RN:   Equipment:   Other:     LOS: 1 day   Kelby Aline, MD 01/09/2014, 11:34 AM

## 2014-01-09 NOTE — Discharge Summary (Signed)
Name: [name] MRN: [id number] DOB: 1940-11-17 73 y.o. PCP: [name], MD  Date of Admission: 01/08/2014  9:07 AM Date of Discharge: 01/09/2014 Attending Physician: No att. providers found  Discharge Diagnosis:  Active Problems:   Hypertension   Chronic diastolic congestive heart failure   DM (diabetes mellitus), type 2, uncontrolled with complications   Anticoagulation goal of INR 2 to 3, for PAF    Chest pain   Tobacco use disorder  Discharge Medications:   Medication List         aspirin 81 MG chewable tablet  Chew 81 mg by mouth daily.     diltiazem 240 MG 24 hr capsule  Commonly known as:  CARDIZEM CD  Take 240 mg by mouth daily.     ferrous sulfate 325 (65 FE) MG tablet  Take 325 mg by mouth daily with breakfast.     flecainide 150 MG tablet  Commonly known as:  TAMBOCOR  Take 225 mg by mouth 2 (two) times daily.     HYDROcodone-acetaminophen 5-325 MG per tablet  Commonly known as:  NORCO/VICODIN  Take 1 tablet by mouth every 6 (six) hours as needed for moderate pain.     hydrocortisone cream 1 %  Apply 1 application topically 2 (two) times daily as needed for itching.     LANTUS SOLOSTAR 100 UNIT/ML injection  Generic drug:  insulin glargine  Inject 12 Units into the skin 2 (two) times daily as needed (blood sugar).     loratadine 10 MG tablet  Commonly known as:  CLARITIN  Take 10 mg by mouth every morning.     metFORMIN 1000 MG tablet  Commonly known as:  GLUCOPHAGE  Take 1,000 mg by mouth 2 (two) times daily with a meal.     metoprolol succinate 50 MG 24 hr tablet  Commonly known as:  TOPROL-XL  Take 50 mg by mouth 2 (two) times daily. Take with or immediately following a meal.     nitroGLYCERIN 0.4 MG SL tablet  Commonly known as:  NITROSTAT  Place 0.4 mg under the tongue every 5 (five) minutes x 3 doses as needed. For chest pain.     pravastatin 40 MG tablet  Commonly known as:  PRAVACHOL  Take 40 mg by mouth daily.     warfarin 5 MG tablet  Commonly known as:  COUMADIN  Take 5-7.5 mg by mouth daily. Take 5mg  on Tuesday, Thursday, Saturday and Sunday.  Take 7.5mg  on Monday, Wednesday, and Friday        Disposition and follow-up:   [name] was discharged from [facility] in Good condition.  At the hospital follow up visit please address:  1.  Atypical chest pain  2.  Labs / imaging needed at time of follow-up: none  3.  Pending labs/ test needing follow-up: none  Follow-up Appointments: Follow-up Information   Follow up with [name], MD On 01/16/2014. (@ 1 pm)    Specialty:  Family Medicine   Contact information:   [address] [place] [address] [phone]       Call Leonie Man, MD. (Clinic will call you to schedule ASAP)    Specialty:  Cardiology   Contact information:   [address] [place] [address] [phone]       Discharge Instructions: Discharge Instructions   Diet - low sodium heart healthy    Complete by:  As directed  Increase activity slowly    Complete by:  As directed            Consultations:    Procedures Performed:  Dg Chest 2 View  01/08/2014   CLINICAL DATA:  Chest pain.  EXAM: CHEST  2 VIEW  COMPARISON:  09/06/2013 and 04/16/2013 and 05/29/2012  FINDINGS: The heart size and mediastinal contours are within normal limits. Both lungs are clear except for a tiny area of linear scarring at the left base laterally. The visualized skeletal structures are unremarkable.  IMPRESSION: No acute disease.   Electronically Signed   By: Rozetta Nunnery M.D.   On: 01/08/2014 09:59   Admission HPI: [name] is a 73 year old gentleman with a history of coronary artery disease, atrial fibrillation (status post ablation), DVT (on Coumadin), hypertension, heart failure with preserved ejection fraction, type 2 diabetes, and tobacco use presents with chest pain. Patient  presents with left-sided chest pain started around 3 AM this morning. He states that it is a soreness, that 7/10 in severity, and was nonradiating. He states that it woke him up from sleep, and he did not try taking any medications for it. Patient reports some associated dizziness this morning as well, but denies having any loss of consciousness or falls. Patient also reports having some associated nausea though denies any episodes of vomiting. In the past, patient has been diagnosed with atrial fibrillation, but is not able to feel when he is having an arrhythmia.   He states that this kind of pain has been going on for almost a year and tends to come and go. He states that over this period, he hasn't noticed anything that makes the pain worse. Exertion does not the pain worse. He has not taken any nitroglycerin at home for any of these pains. Patient does endorse having some subjective fevers at home but did not measure his temperature. Additionally, patient reports having a chronic cough along with some congestion for the last several months. Patient denies having any inhalers that he uses at home. Patient otherwise denies any chills, dyspnea, abdominal pain, constipation, diarrhea, dysuria, hematuria, or there is any change in color of the stool. Patient reports smoking half a pack a day with a greater than 100 pack year history. She denies any alcohol use or illicit drug use.   By the time of arrival at the hospital, patient reports that his soreness has moderately improved. Patient reports that it is now 4/10 in severity upon interview. Patient has not received anything to alleviate the pain in the emergency department.   Hospital Course by problem list: Active Problems:   Hypertension   Chronic diastolic congestive heart failure   DM (diabetes mellitus), type 2, uncontrolled with complications   Anticoagulation goal of INR 2 to 3, for PAF    Chest pain   Tobacco use disorder   # Atypical Chest  pain: Patient has a history of CAD with a catheterization in 2012 showing non-occlusive disease. Patient presents with chest pain that is non-exertional. It is reproducible with deep palpation of the L lower rib cage and twisting of his torso. Patient reports similar though less severe symptoms for almost a year. EKG and troponins x3 not concerning for infarction. Chest x-ray unremarkable for an acute cardiopulmonary process. Called cardiology to check that they are okay with discharge if he has follow-up. He continued his sublingual nitrogen PRN, home ASA 81 mg daily, and pravastatin 40 mg, and had morphine prn under cardiac monitoring.  #  Atrial fibrillation: Patient has a history of cardiac ablation is currently on diltiazem and flecainide for rate and rhythm management. We continued his home diltiazem and flecainide.  #Chronic cough: Patient reports having a history of cough for several months. It has not been productive of any significant sputum. Chest x-ray was not remarkable for any infiltrate. Patient does not seem to have any pulmonary function tests on file. Patient has not been prescribed any inhalers at home. This could represent an allergic etiology given that he is prescribed claritin. No acute issues and currently denies. May consider further workup as an outpatient.   #Hypertension: Patient BP was 98-146/50-84 overnight. We continued his home diltiazem and metoprolol.   #History of DVT: Patient is on warfarin. INR was 1.92 on presentation then 2.25 the morning of discharge. No current calf pain. No lower extremity swelling. Patient satting well on room air. We continued home warfarin.   #Heart failure with preserved ejection fraction: Echo from October 2014 showing 55-60% left ventricle ejection fraction, concentric hypertrophy suggesting diastolic function. We continued home metoprolol.   #Type 2 diabetes: Patient's last hemoglobin A1c was 7.0 in September 2015. Patient is on 12 units of  lantus each bedtime and metformin 1000 mg twice a day. CBG 102-146 upon admission.  He was on a sensitive sliding scale of insulin.  #Tobacco use: Still smokes 0.5 packs a day. We encouraged smoking cessation.    Discharge Vitals:   BP 104/56  Pulse 60  Temp(Src) 97.3 F (36.3 C) (Oral)  Resp 18  Ht 5\' 6"  (1.676 m)  Wt 188 lb 6.4 oz (85.458 kg)  BMI 30.42 kg/m2  SpO2 98%  Discharge Physical Exam: Gen: A&O x 4, no acute distress, well developed, well nourished  HEENT: Atraumatic, PERRL, EOMI, sclerae anicteric, moist mucous membranes  Neck: No carotid bruits or JVD  Heart: Regular rate and rhythm, normal S1 S2, no murmurs, rubs, or gallops  Lungs: Clear to auscultation bilaterally, respirations unlabored  Abd: Soft, non-tender, non-distended, + bowel sounds, no hepatosplenomegaly  Ext: No edema or cyanosis  Discharge Labs:  Basic Metabolic Panel:  Recent Labs  01/08/14 0930  NA 138  K 4.4  CL 100  CO2 23  GLUCOSE 144*  BUN 13  CREATININE 0.95  CALCIUM 9.4   CBC:  Recent Labs  01/08/14 0930  WBC 6.9  HGB 15.4  HCT 46.4  MCV 88.0  PLT 247   Cardiac Enzymes:  Recent Labs  01/08/14 1620 01/08/14 1942 01/09/14 0405  TROPONINI <0.30 <0.30 <0.30   BNP:  Recent Labs  01/08/14 0930  PROBNP 105.2   CBG:  Recent Labs  01/08/14 1201 01/08/14 1622 01/08/14 2132 01/08/14 2343 01/09/14 0616 01/09/14 1114  GLUCAP 102* 128* 128* 138* 146* 136*   Coagulation:  Recent Labs  01/08/14 0930 01/09/14 0405  LABPROT 22.1* 25.0*  INR 1.92* 2.25*   Urine Drug Screen: Drugs of Abuse     Component Value Date/Time   LABOPIA NEGATIVE 03/24/2010 0350   COCAINSCRNUR NEGATIVE 03/24/2010 0350   LABBENZ NEGATIVE 03/24/2010 0350   AMPHETMU NEGATIVE 03/24/2010 0350  Misc. Labs: EKG: First degree AV block, no significant ST, T-wave changes, unchanged  Signed: Kelby Aline, MD 01/09/2014, 5:02 PM    Services Ordered on Discharge: none Equipment  Ordered on Discharge: none

## 2014-01-09 NOTE — Progress Notes (Signed)
Discharge paperwork reviewed. Discharge medications and prescriptions reviewed. Follow up appointments reviewed with patient. Patient acknowledged understanding same.

## 2014-01-09 NOTE — Progress Notes (Signed)
ANTICOAGULATION CONSULT NOTE   Pharmacy Consult for coumadin Indication: atrial fibrillation  No Known Allergies  Patient Measurements: Height: 5\' 6"  (167.6 cm) Weight: 188 lb 6.4 oz (85.458 kg) (Scale B) IBW/kg (Calculated) : 63.8   Vital Signs: Temp: 97.3 F (36.3 C) (10/16 0700) Temp Source: Oral (10/16 0700) BP: 106/50 mmHg (10/16 0700) Pulse Rate: 58 (10/16 0700)  Labs:  Recent Labs  01/08/14 0930 01/08/14 1620 01/08/14 1942 01/09/14 0405  HGB 15.4  --   --   --   HCT 46.4  --   --   --   PLT 247  --   --   --   LABPROT 22.1*  --   --  25.0*  INR 1.92*  --   --  2.25*  CREATININE 0.95  --   --   --   TROPONINI  --  <0.30 <0.30 <0.30    Estimated Creatinine Clearance: 70 ml/min (by C-G formula based on Cr of 0.95).   Medical History: Past Medical History  Diagnosis Date  . Atrial flutter 08/05/2010    Status post caval tricuspid isthmus ablation by Dr. [name]  . PAF (paroxysmal atrial fibrillation) 2012    Recurrent after atrial flutter, currently controlled on flecainide plus diltiazem  . Diabetes mellitus type II     On metformin and insulin  . Hyperlipidemia   . HTN (hypertension)   . Diastolic CHF, chronic     preserved EF 55%; exacerbated by A. fib/A. flutter  . Coronary artery disease, non-occlusive January 2012    Nonocclusive disease by cath, performed for ST elevations on ECG.  . History of DVT (deep vein thrombosis)     on warfarin  . Tobacco abuse     Initially quit, now back to smoking.  [name] COPD (chronic obstructive pulmonary disease)   . Cholecystitis, acute January 2012    Status post cholecystectomy preceded by T-tube,   . Cervical radiculopathy due to degenerative joint disease of spine   . Claudication     lower ext dopplers 08/18/11-normal ABIs bilaterally with normal triphasic waveforms  . Arthritis   . Dysrhythmia     HX OF ATRIAL IFB /FLUTTER     Medications:  Prescriptions prior to admission  Medication Sig Dispense  Refill  . aspirin 81 MG chewable tablet Chew 81 mg by mouth daily.      [name] diltiazem (CARDIZEM CD) 240 MG 24 hr capsule Take 240 mg by mouth daily.      . ferrous sulfate 325 (65 FE) MG tablet Take 325 mg by mouth daily with breakfast.      . flecainide (TAMBOCOR) 150 MG tablet Take 225 mg by mouth 2 (two) times daily.      . hydrocortisone cream 1 % Apply 1 application topically 2 (two) times daily as needed for itching.      . insulin glargine (LANTUS SOLOSTAR) 100 UNIT/ML injection Inject 12 Units into the skin 2 (two) times daily as needed (blood sugar).       [name] loratadine (CLARITIN) 10 MG tablet Take 10 mg by mouth every morning.       . metFORMIN (GLUCOPHAGE) 1000 MG tablet Take 1,000 mg by mouth 2 (two) times daily with a meal.       . metoprolol succinate (TOPROL-XL) 50 MG 24 hr tablet Take 50 mg by mouth 2 (two) times daily. Take with or immediately following a meal.      . nitroGLYCERIN (NITROSTAT) 0.4 MG SL tablet Place  0.4 mg under the tongue every 5 (five) minutes x 3 doses as needed. For chest pain.      . pravastatin (PRAVACHOL) 40 MG tablet Take 40 mg by mouth daily.      [name] warfarin (COUMADIN) 5 MG tablet Take 5-7.5 mg by mouth daily. Take 5mg  on Tuesday, Thursday, Saturday and Sunday.  Take 7.5mg  on Monday, Wednesday, and Friday        Assessment: 73 yo man admitted with CP to continue coumadin.  Home dose as above.  INR today therapeutic Goal of Therapy:  INR 2-3 Monitor platelets by anticoagulation protocol: Yes   Plan:  Resume home dose coumadin, 7.5 mg MWF , 5 mg other days Daily PT/INR  Thanks for allowing pharmacy to be a part of this patient's care.  Excell Seltzer, PharmD Clinical Pharmacist, [phone] 01/09/2014,10:04 AM

## 2014-01-16 ENCOUNTER — Ambulatory Visit: Payer: Medicare Other | Admitting: Cardiology

## 2014-01-16 ENCOUNTER — Encounter: Payer: Self-pay | Admitting: Cardiology

## 2014-01-16 ENCOUNTER — Telehealth: Payer: Self-pay | Admitting: Cardiology

## 2014-01-19 ENCOUNTER — Ambulatory Visit (INDEPENDENT_AMBULATORY_CARE_PROVIDER_SITE_OTHER): Payer: Medicare Other | Admitting: Pharmacist Clinician (PhC)/ Clinical Pharmacy Specialist

## 2014-01-19 DIAGNOSIS — I480 Paroxysmal atrial fibrillation: Secondary | ICD-10-CM

## 2014-01-19 DIAGNOSIS — Z7901 Long term (current) use of anticoagulants: Secondary | ICD-10-CM

## 2014-01-19 LAB — POCT INR: INR: 1.9

## 2014-01-19 NOTE — Telephone Encounter (Signed)
Close encounter 

## 2014-01-20 ENCOUNTER — Ambulatory Visit: Payer: Medicare Other | Admitting: Cardiology

## 2014-01-21 NOTE — Pre-Procedure Instructions (Signed)
Kinta Martis  01/21/2014   Your procedure is scheduled on:  Fri, Nov 6 @ 7:30 AM  Report to [facility]  at 5:30 AM.  Call this number if you have problems the morning of surgery: [phone]   Remember:   Do not eat food or drink liquids after midnight.   Take these medicines the morning of surgery with A SIP OF WATER: Cardizem(Diltiazem),Flecainide(Tambocor),Pain Pill(if needed),Claritin(Loratadine),and Metoprolol(Toprol)               Stop taking your Coumadin 5 days prior to surgery as you have been instructed and Aspirin. No Goody's,BC's,Aleve,Ibuprofen,Fish Oil,or any Herbal Medications   Do not wear jewelry  Do not wear lotions, powders, or colognes. You may wear deodorant.  Men may shave face and neck.  Do not bring valuables to the hospital.  [facility] is not responsible                  for any belongings or valuables.               Contacts, dentures or bridgework may not be worn into surgery.  Leave suitcase in the car. After surgery it may be brought to your room.  For patients admitted to the hospital, discharge time is determined by your                treatment team.               Patients discharged the day of surgery will not be allowed to drive  home.    Special Instructions:  [place] - Preparing for Surgery  Before surgery, you can play an important role.  Because skin is not sterile, your skin needs to be as free of germs as possible.  You can reduce the number of germs on you skin by washing with CHG (chlorahexidine gluconate) soap before surgery.  CHG is an antiseptic cleaner which kills germs and bonds with the skin to continue killing germs even after washing.  Please DO NOT use if you have an allergy to CHG or antibacterial soaps.  If your skin becomes reddened/irritated stop using the CHG and inform your nurse when you arrive at Short Stay.  Do not shave (including legs and underarms) for at least 48 hours prior to the first CHG shower.   You may shave your face.  Please follow these instructions carefully:   1.  Shower with CHG Soap the night before surgery and the                                morning of Surgery.  2.  If you choose to wash your hair, wash your hair first as usual with your       normal shampoo.  3.  After you shampoo, rinse your hair and body thoroughly to remove the                      Shampoo.  4.  Use CHG as you would any other liquid soap.  You can apply chg directly       to the skin and wash gently with scrungie or a clean washcloth.  5.  Apply the CHG Soap to your body ONLY FROM THE NECK DOWN.        Do not use on open wounds or open sores.  Avoid contact with your eyes,  ears, mouth and genitals (private parts).  Wash genitals (private parts)       with your normal soap.  6.  Wash thoroughly, paying special attention to the area where your surgery        will be performed.  7.  Thoroughly rinse your body with warm water from the neck down.  8.  DO NOT shower/wash with your normal soap after using and rinsing off       the CHG Soap.  9.  Pat yourself dry with a clean towel.            10.  Wear clean pajamas.            11.  Place clean sheets on your bed the night of your first shower and do not        sleep with pets.  Day of Surgery  Do not apply any lotions/deoderants the morning of surgery.  Please wear clean clothes to the hospital/surgery center.     Please read over the following fact sheets that you were given: Pain Booklet, Coughing and Deep Breathing and Surgical Site Infection Prevention

## 2014-01-22 ENCOUNTER — Encounter (HOSPITAL_COMMUNITY): Payer: Self-pay

## 2014-01-22 ENCOUNTER — Encounter (HOSPITAL_COMMUNITY)
Admission: RE | Admit: 2014-01-22 | Discharge: 2014-01-22 | Disposition: A | Discharge status: Home / Self Care | Payer: Medicare Other | Source: Ambulatory Visit | Attending: Orthopaedic Surgery | Admitting: Orthopaedic Surgery

## 2014-01-22 DIAGNOSIS — Z01812 Encounter for preprocedural laboratory examination: Secondary | ICD-10-CM | POA: Insufficient documentation

## 2014-01-22 HISTORY — DX: Weakness: R53.1

## 2014-01-22 HISTORY — DX: Personal history of other medical treatment: Z92.89

## 2014-01-22 HISTORY — DX: Anemia, unspecified: D64.9

## 2014-01-22 HISTORY — DX: Personal history of peptic ulcer disease: Z87.11

## 2014-01-22 HISTORY — DX: Personal history of other diseases of the digestive system: Z87.19

## 2014-01-22 HISTORY — DX: Personal history of other diseases of the respiratory system: Z87.09

## 2014-01-22 LAB — BASIC METABOLIC PANEL
Anion gap: 14 (ref 5–15)
BUN: 13 mg/dL (ref 6–23)
CHLORIDE: 103 meq/L (ref 96–112)
CO2: 22 mEq/L (ref 19–32)
Calcium: 9.3 mg/dL (ref 8.4–10.5)
Creatinine, Ser: 0.91 mg/dL (ref 0.50–1.35)
GFR calc non Af Amer: 81 mL/min — ABNORMAL LOW (ref >90)
GLUCOSE: 124 mg/dL — AB (ref 70–99)
POTASSIUM: 4.2 meq/L (ref 3.7–5.3)
Sodium: 139 mEq/L (ref 137–147)

## 2014-01-22 LAB — CBC
HCT: 43.4 % (ref 39.0–52.0)
HEMOGLOBIN: 14.7 g/dL (ref 13.0–17.0)
MCH: 29.2 pg (ref 26.0–34.0)
MCHC: 33.9 g/dL (ref 30.0–36.0)
MCV: 86.3 fL (ref 78.0–100.0)
Platelets: 241 10*3/uL (ref 150–400)
RBC: 5.03 MIL/uL (ref 4.22–5.81)
RDW: 13.7 % (ref 11.5–15.5)
WBC: 6.5 10*3/uL (ref 4.0–10.5)

## 2014-01-22 NOTE — Progress Notes (Signed)
01/22/14 0914  OBSTRUCTIVE SLEEP APNEA  Have you ever been diagnosed with sleep apnea through a sleep study? No  Do you snore loudly (loud enough to be heard through closed doors)?  0  Do you often feel tired, fatigued, or sleepy during the daytime? 0  Has anyone observed you stop breathing during your sleep? 0  Do you have, or are you being treated for high blood pressure? 1  BMI more than 35 kg/m2? 0  Age over 74 years old? 1  Neck circumference greater than 40 cm/16 inches? 1 (18)  Gender: 1  Obstructive Sleep Apnea Score 4  Score 4 or greater  Results sent to PCP

## 2014-01-22 NOTE — Progress Notes (Addendum)
Medical and Cardiac Clearance note in chart   Echo report in epic from 2014  Stress test in epic from 2012/2013/2015  Heart cath in epic from 2012  EKG in epic from 01-08-14  CXR in epic from 09-06-13  Cardiologist is Dr.Harding with last visit about 6 months ago(note in epic from 07-22-13)  Medical Md is Bernerd Limbo

## 2014-01-22 NOTE — Progress Notes (Signed)
Average fasting blood sugar 134-138

## 2014-01-23 ENCOUNTER — Ambulatory Visit (INDEPENDENT_AMBULATORY_CARE_PROVIDER_SITE_OTHER): Payer: Medicare Other | Admitting: Ophthalmology

## 2014-01-23 DIAGNOSIS — E11329 Type 2 diabetes mellitus with mild nonproliferative diabetic retinopathy without macular edema: Secondary | ICD-10-CM

## 2014-01-23 DIAGNOSIS — H3531 Nonexudative age-related macular degeneration: Secondary | ICD-10-CM

## 2014-01-23 DIAGNOSIS — H43813 Vitreous degeneration, bilateral: Secondary | ICD-10-CM

## 2014-01-23 DIAGNOSIS — H35372 Puckering of macula, left eye: Secondary | ICD-10-CM

## 2014-01-23 DIAGNOSIS — E11319 Type 2 diabetes mellitus with unspecified diabetic retinopathy without macular edema: Secondary | ICD-10-CM

## 2014-01-25 DIAGNOSIS — R2689 Other abnormalities of gait and mobility: Secondary | ICD-10-CM

## 2014-01-25 HISTORY — DX: Other abnormalities of gait and mobility: R26.89

## 2014-01-26 ENCOUNTER — Ambulatory Visit: Payer: Medicare Other | Admitting: Cardiology

## 2014-01-29 MED ORDER — CEFAZOLIN SODIUM-DEXTROSE 2-3 GM-% IV SOLR
2.0000 g | INTRAVENOUS | Status: AC
  Administered 2014-01-30: 2 g via INTRAVENOUS
  Filled 2014-01-29: qty 50
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
## 2014-01-30 ENCOUNTER — Ambulatory Visit (HOSPITAL_COMMUNITY)
Admission: RE | Admit: 2014-01-30 | Discharge: 2014-01-30 | Disposition: A | Discharge status: Home / Self Care | Payer: Medicare Other | Source: Ambulatory Visit | Attending: Orthopaedic Surgery | Admitting: Orthopaedic Surgery

## 2014-01-30 ENCOUNTER — Encounter (HOSPITAL_COMMUNITY): Payer: Self-pay | Admitting: Anesthesiology

## 2014-01-30 ENCOUNTER — Encounter (HOSPITAL_COMMUNITY)
Admission: RE | Disposition: A | Discharge status: Home / Self Care | Payer: Self-pay | Source: Ambulatory Visit | Attending: Orthopaedic Surgery

## 2014-01-30 ENCOUNTER — Ambulatory Visit (HOSPITAL_COMMUNITY): Payer: Medicare Other | Admitting: Anesthesiology

## 2014-01-30 DIAGNOSIS — M5412 Radiculopathy, cervical region: Secondary | ICD-10-CM | POA: Diagnosis not present

## 2014-01-30 DIAGNOSIS — I480 Paroxysmal atrial fibrillation: Secondary | ICD-10-CM | POA: Diagnosis not present

## 2014-01-30 DIAGNOSIS — I10 Essential (primary) hypertension: Secondary | ICD-10-CM | POA: Insufficient documentation

## 2014-01-30 DIAGNOSIS — D649 Anemia, unspecified: Secondary | ICD-10-CM | POA: Diagnosis not present

## 2014-01-30 DIAGNOSIS — G5601 Carpal tunnel syndrome, right upper limb: Secondary | ICD-10-CM | POA: Diagnosis present

## 2014-01-30 DIAGNOSIS — Z79899 Other long term (current) drug therapy: Secondary | ICD-10-CM | POA: Diagnosis not present

## 2014-01-30 DIAGNOSIS — E119 Type 2 diabetes mellitus without complications: Secondary | ICD-10-CM | POA: Insufficient documentation

## 2014-01-30 DIAGNOSIS — Z7982 Long term (current) use of aspirin: Secondary | ICD-10-CM | POA: Insufficient documentation

## 2014-01-30 DIAGNOSIS — I5032 Chronic diastolic (congestive) heart failure: Secondary | ICD-10-CM | POA: Diagnosis not present

## 2014-01-30 DIAGNOSIS — E785 Hyperlipidemia, unspecified: Secondary | ICD-10-CM | POA: Insufficient documentation

## 2014-01-30 DIAGNOSIS — Z794 Long term (current) use of insulin: Secondary | ICD-10-CM | POA: Insufficient documentation

## 2014-01-30 DIAGNOSIS — I2510 Atherosclerotic heart disease of native coronary artery without angina pectoris: Secondary | ICD-10-CM | POA: Diagnosis not present

## 2014-01-30 DIAGNOSIS — I4892 Unspecified atrial flutter: Secondary | ICD-10-CM | POA: Diagnosis not present

## 2014-01-30 DIAGNOSIS — J449 Chronic obstructive pulmonary disease, unspecified: Secondary | ICD-10-CM | POA: Insufficient documentation

## 2014-01-30 DIAGNOSIS — G5602 Carpal tunnel syndrome, left upper limb: Secondary | ICD-10-CM | POA: Diagnosis not present

## 2014-01-30 DIAGNOSIS — F17210 Nicotine dependence, cigarettes, uncomplicated: Secondary | ICD-10-CM | POA: Diagnosis not present

## 2014-01-30 DIAGNOSIS — Z7901 Long term (current) use of anticoagulants: Secondary | ICD-10-CM | POA: Diagnosis not present

## 2014-01-30 HISTORY — PX: CARPAL TUNNEL RELEASE: SHX101

## 2014-01-30 LAB — PROTIME-INR
INR: 1.01 (ref 0.00–1.49)
Prothrombin Time: 13.4 seconds (ref 11.6–15.2)

## 2014-01-30 LAB — GLUCOSE, CAPILLARY
Glucose-Capillary: 131 mg/dL — ABNORMAL HIGH (ref 70–99)
Glucose-Capillary: 134 mg/dL — ABNORMAL HIGH (ref 70–99)

## 2014-01-30 LAB — APTT: aPTT: 30 seconds (ref 24–37)

## 2014-01-30 SURGERY — CARPAL TUNNEL RELEASE
Anesthesia: General | Site: Wrist | Laterality: Bilateral

## 2014-01-30 MED ORDER — FENTANYL CITRATE 0.05 MG/ML IJ SOLN
INTRAMUSCULAR | Status: DC | PRN
  Administered 2014-01-30: 100 ug via INTRAVENOUS
  Administered 2014-01-30 (×2): 50 ug via INTRAVENOUS

## 2014-01-30 MED ORDER — SUCCINYLCHOLINE CHLORIDE 20 MG/ML IJ SOLN
INTRAMUSCULAR | Status: AC
  Filled 2014-01-30: qty 1

## 2014-01-30 MED ORDER — PROPOFOL 10 MG/ML IV BOLUS
INTRAVENOUS | Status: AC
  Filled 2014-01-30: qty 20

## 2014-01-30 MED ORDER — LACTATED RINGERS IV SOLN
INTRAVENOUS | Status: DC | PRN
  Administered 2014-01-30 (×2): via INTRAVENOUS

## 2014-01-30 MED ORDER — FENTANYL CITRATE 0.05 MG/ML IJ SOLN
INTRAMUSCULAR | Status: AC
  Filled 2014-01-30: qty 5

## 2014-01-30 MED ORDER — DEXAMETHASONE SODIUM PHOSPHATE 4 MG/ML IJ SOLN
INTRAMUSCULAR | Status: AC
  Filled 2014-01-30: qty 1

## 2014-01-30 MED ORDER — ONDANSETRON HCL 4 MG/2ML IJ SOLN
INTRAMUSCULAR | Status: AC
  Filled 2014-01-30: qty 2

## 2014-01-30 MED ORDER — ONDANSETRON HCL 4 MG/2ML IJ SOLN
INTRAMUSCULAR | Status: DC | PRN
  Administered 2014-01-30: 4 mg via INTRAVENOUS

## 2014-01-30 MED ORDER — LIDOCAINE HCL (CARDIAC) 20 MG/ML IV SOLN
INTRAVENOUS | Status: AC
  Filled 2014-01-30: qty 5

## 2014-01-30 MED ORDER — DEXAMETHASONE SODIUM PHOSPHATE 4 MG/ML IJ SOLN
INTRAMUSCULAR | Status: DC | PRN
  Administered 2014-01-30: 4 mg via INTRAVENOUS

## 2014-01-30 MED ORDER — BUPIVACAINE HCL (PF) 0.25 % IJ SOLN
INTRAMUSCULAR | Status: DC | PRN
  Administered 2014-01-30: 10 mL

## 2014-01-30 MED ORDER — DEXTROSE 5 % IV SOLN
10.0000 mg | INTRAVENOUS | Status: DC | PRN
Start: 2014-01-30 — End: 2014-01-30
  Administered 2014-01-30: 40 ug/min via INTRAVENOUS

## 2014-01-30 MED ORDER — HYDROCODONE-ACETAMINOPHEN 5-325 MG PO TABS: 1.0000 | ORAL_TABLET | Freq: Four times a day (QID) | ORAL | Status: DC | PRN

## 2014-01-30 MED ORDER — SUCCINYLCHOLINE CHLORIDE 20 MG/ML IJ SOLN
INTRAMUSCULAR | Status: DC | PRN
  Administered 2014-01-30: 80 mg via INTRAVENOUS

## 2014-01-30 MED ORDER — GLYCOPYRROLATE 0.2 MG/ML IJ SOLN
INTRAMUSCULAR | Status: DC | PRN
  Administered 2014-01-30: 0.2 mg via INTRAVENOUS

## 2014-01-30 MED ORDER — PHENYLEPHRINE HCL 10 MG/ML IJ SOLN
INTRAMUSCULAR | Status: DC | PRN
  Administered 2014-01-30 (×6): 40 ug via INTRAVENOUS

## 2014-01-30 MED ORDER — EPHEDRINE SULFATE 50 MG/ML IJ SOLN
INTRAMUSCULAR | Status: DC | PRN
  Administered 2014-01-30: 10 mg via INTRAVENOUS

## 2014-01-30 MED ORDER — SODIUM CHLORIDE 0.9 % IR SOLN
Status: DC | PRN
Start: 2014-01-30 — End: 2014-01-30
  Administered 2014-01-30: 1000 mL

## 2014-01-30 MED ORDER — MIDAZOLAM HCL 2 MG/2ML IJ SOLN
INTRAMUSCULAR | Status: AC
  Filled 2014-01-30: qty 2

## 2014-01-30 MED ORDER — SODIUM CHLORIDE 0.9 % IJ SOLN
INTRAMUSCULAR | Status: AC
  Filled 2014-01-30: qty 20

## 2014-01-30 MED ORDER — EPHEDRINE SULFATE 50 MG/ML IJ SOLN
INTRAMUSCULAR | Status: AC
  Filled 2014-01-30: qty 1

## 2014-01-30 MED ORDER — MIDAZOLAM HCL 5 MG/5ML IJ SOLN
INTRAMUSCULAR | Status: DC | PRN
  Administered 2014-01-30 (×2): 1 mg via INTRAVENOUS

## 2014-01-30 MED ORDER — LIDOCAINE HCL (CARDIAC) 20 MG/ML IV SOLN
INTRAVENOUS | Status: DC | PRN
  Administered 2014-01-30: 40 mg via INTRAVENOUS

## 2014-01-30 MED ORDER — GLYCOPYRROLATE 0.2 MG/ML IJ SOLN
INTRAMUSCULAR | Status: AC
  Filled 2014-01-30: qty 1

## 2014-01-30 MED ORDER — PHENYLEPHRINE 40 MCG/ML (10ML) SYRINGE FOR IV PUSH (FOR BLOOD PRESSURE SUPPORT)
PREFILLED_SYRINGE | INTRAVENOUS | Status: AC
  Filled 2014-01-30: qty 10

## 2014-01-30 MED ORDER — BUPIVACAINE HCL (PF) 0.25 % IJ SOLN
INTRAMUSCULAR | Status: AC
  Filled 2014-01-30: qty 30

## 2014-01-30 MED ORDER — PROPOFOL 10 MG/ML IV BOLUS
INTRAVENOUS | Status: DC | PRN
  Administered 2014-01-30: 20 mg via INTRAVENOUS
  Administered 2014-01-30: 140 mg via INTRAVENOUS
  Administered 2014-01-30: 20 mg via INTRAVENOUS
  Administered 2014-01-30: 40 mg via INTRAVENOUS

## 2014-01-30 SURGICAL SUPPLY — 30 items
BANDAGE ELASTIC 3 VELCRO ST LF (GAUZE/BANDAGES/DRESSINGS) ×6 IMPLANT
BNDG CONFORM 3 STRL LF (GAUZE/BANDAGES/DRESSINGS) ×6 IMPLANT
BNDG ESMARK 4X9 LF (GAUZE/BANDAGES/DRESSINGS) ×3 IMPLANT
CORDS BIPOLAR (ELECTRODE) ×3 IMPLANT
COVER SURGICAL LIGHT HANDLE (MISCELLANEOUS) ×3 IMPLANT
CUFF TOURNIQUET SINGLE 18IN (TOURNIQUET CUFF) ×6 IMPLANT
DRAPE EXTREMITY TIBURON (DRAPES) ×3 IMPLANT
DRAPE SURG 17X23 STRL (DRAPES) ×3 IMPLANT
DURAPREP 26ML APPLICATOR (WOUND CARE) IMPLANT
ELECT REM PT RETURN 9FT ADLT (ELECTROSURGICAL) ×3
ELECTRODE REM PT RTRN 9FT ADLT (ELECTROSURGICAL) ×1 IMPLANT
GAUZE SPONGE 4X4 12PLY STRL (GAUZE/BANDAGES/DRESSINGS) ×3 IMPLANT
GAUZE XEROFORM 1X8 LF (GAUZE/BANDAGES/DRESSINGS) ×6 IMPLANT
GOWN STRL REIN XL XLG (GOWN DISPOSABLE) ×3 IMPLANT
GOWN STRL REUS W/ TWL LRG LVL3 (GOWN DISPOSABLE) ×1 IMPLANT
GOWN STRL REUS W/TWL LRG LVL3 (GOWN DISPOSABLE) ×2
KIT BASIN OR (CUSTOM PROCEDURE TRAY) ×3 IMPLANT
KIT ROOM TURNOVER OR (KITS) ×3 IMPLANT
NS IRRIG 1000ML POUR BTL (IV SOLUTION) ×6 IMPLANT
PACK ORTHO EXTREMITY (CUSTOM PROCEDURE TRAY) ×3 IMPLANT
PAD ARMBOARD 7.5X6 YLW CONV (MISCELLANEOUS) ×6 IMPLANT
PADDING CAST ABS 4INX4YD NS (CAST SUPPLIES) ×4
PADDING CAST ABS COTTON 4X4 ST (CAST SUPPLIES) ×2 IMPLANT
SPONGE GAUZE 4X4 12PLY STER LF (GAUZE/BANDAGES/DRESSINGS) ×6 IMPLANT
SUT ETHILON 4 0 PS 2 18 (SUTURE) ×3 IMPLANT
SYR CONTROL 10ML LL (SYRINGE) IMPLANT
TOWEL OR 17X24 6PK STRL BLUE (TOWEL DISPOSABLE) ×3 IMPLANT
TOWEL OR 17X26 10 PK STRL BLUE (TOWEL DISPOSABLE) ×3 IMPLANT
UNDERPAD 30X30 INCONTINENT (UNDERPADS AND DIAPERS) ×6 IMPLANT
WATER STERILE IRR 1000ML POUR (IV SOLUTION) ×3 IMPLANT

## 2014-01-30 NOTE — Anesthesia Postprocedure Evaluation (Signed)
  Anesthesia Post-op Note  Patient: [name]  Procedure(s) Performed: Procedure(s): BILATERAL CARPAL TUNNEL RELEASE (Bilateral)  Patient Location: PACU  Anesthesia Type:General  Level of Consciousness: awake and alert   Airway and Oxygen Therapy: Patient Spontanous Breathing  Post-op Pain: none  Post-op Assessment: Post-op Vital signs reviewed, Patient's Cardiovascular Status Stable and Respiratory Function Stable  Post-op Vital Signs: Reviewed  Filed Vitals:   01/30/14 0948  BP:   Pulse: 73  Temp: 36.3 C  Resp: 15    Complications: No apparent anesthesia complications

## 2014-01-30 NOTE — Anesthesia Procedure Notes (Signed)
Procedure Name: LMA Insertion Date/Time: 01/30/2014 7:45 AM Performed by: Terrill Mohr Pre-anesthesia Checklist: Emergency Drugs available, Patient identified, Suction available and Patient being monitored Patient Re-evaluated:Patient Re-evaluated prior to inductionOxygen Delivery Method: Circle system utilized Preoxygenation: Pre-oxygenation with 100% oxygen Intubation Type: IV induction Ventilation: Mask ventilation without difficulty LMA: LMA inserted LMA Size: 5.0 Number of attempts: 1 Tube secured with: taped across cheeks; gauze roll b/t gums. Dental Injury: Teeth and Oropharynx as per pre-operative assessment

## 2014-01-30 NOTE — Discharge Instructions (Signed)
1. Remove dressings on Sunday and place band aids on the incisions 2. Take pain meds as needed 3. Do not submerge hands until follow up appointment

## 2014-01-30 NOTE — Op Note (Signed)
   Carpal tunnel op note  DATE OF SURGERY:01/30/2014  PREOPERATIVE DIAGNOSIS:  Bilateral carpal tunnel syndrome  POSTOPERATIVE DIAGNOSIS: same  PROCEDURE:  Bilateral  carpal tunnel release. CPT 64721 x 2  SURGEON: Surgeon(s): Berlyn Malina Eduard Roux, MD  ANESTHESIA:  Choice  TOURNIQUET TIME: Total Tourniquet Time Documented: Upper Arm (Right) - 17 minutes Upper Arm (Right) - 11 minutes Total: Upper Arm (Right) - 56 minutes .  BLOOD LOSS: Minimal.  COMPLICATIONS: None.  PATHOLOGY: None.  INDICATIONS: The patient is a 73 y.o. -year-old male who presented with carpal tunnel syndrome failing nonsurgical management, indicated for surgical release.  DESCRIPTION OF PROCEDURE: The patient was identified in the preoperative holding area.  The operative site was marked by the surgeon and confirmed by the patient.  He was brought back to the operating room.  Anesthesia was induced by the anesthesia team.  A well padded nonsterile tourniquet was placed. The operative extremity was prepped and draped in standard sterile fashion.  A timeout was performed.  Preoperative antibiotics were given.  We began with the right side. A palmar incision was made about 5 mm ulnar to the thenar crease.  The palmar aponeurosis was exposed and divided in line with the skin incision. The palmaris brevis was visualized and divided.  The distal edge of the transcarpal ligament was identified. A hemostat was inserted into the carpal tunnel to protect the median nerve and the flexor tendons. Then, the transverse carpal ligament was released under direct visualization. Proximally, a subcutaneous tunnel was made allowing a Sewell retractor to be placed. Then, the distal portion of the antebrachial fascia was released. Distally, all fibrous bands were released. The median nerve was visualized, and the fat pad was exposed. Following release, local infiltration with 0.25% of Sensorcaine was given. The tourniquet was deflated.     Total Tourniquet Time Documented: Upper Arm (Right) - 17 minutes Upper Arm (Right) - 11 minutes Total: Upper Arm (Right) - 28 minutes . Hemostasis achieved.  Wound was irrigated and closed with 4-0 nylon sutures. Sterile dressing applied. The same was then repeated for the left side.  The patient was transferred to the recovery room in stable condition after all counts were correct.  POSTOPERATIVE PLAN: To start nerve gliding exercises as tolerated and no heavy lifting for four weeks.

## 2014-01-30 NOTE — Progress Notes (Signed)
Call to Dr. Erlinda Hong, no need to repeat INR today.

## 2014-01-30 NOTE — Transfer of Care (Signed)
Immediate Anesthesia Transfer of Care Note  Patient: [name]  Procedure(s) Performed: Procedure(s): BILATERAL CARPAL TUNNEL RELEASE (Bilateral)  Patient Location: PACU  Anesthesia Type:General  Level of Consciousness: awake and confused  Airway & Oxygen Therapy: Patient Spontanous Breathing and Patient connected to face mask oxygen  Post-op Assessment: Report given to PACU RN, Post -op Vital signs reviewed and stable and Patient moving all extremities  Post vital signs: Reviewed and stable  Complications: No apparent anesthesia complications and intitial confusion cleared after five minutes

## 2014-01-30 NOTE — Anesthesia Preprocedure Evaluation (Addendum)
Anesthesia Evaluation  Patient identified by MRN, date of birth, ID band Patient awake    Reviewed: Allergy & Precautions, H&P , NPO status , Patient's Chart, lab work & pertinent test results, reviewed documented beta blocker date and time   Airway Mallampati: II  TM Distance: >3 FB Neck ROM: Full    Dental no notable dental hx. (+) Edentulous Upper, Edentulous Lower, Dental Advisory Given   Pulmonary COPDCurrent Smoker,  breath sounds clear to auscultation  Pulmonary exam normal       Cardiovascular hypertension, Pt. on medications and Pt. on home beta blockers + CAD and +CHF + dysrhythmias Atrial Fibrillation Rhythm:Regular Rate:Normal     Neuro/Psych negative neurological ROS  negative psych ROS   GI/Hepatic negative GI ROS, Neg liver ROS,   Endo/Other  diabetes, Well Controlled, Type 2, Insulin Dependent, Oral Hypoglycemic Agents  Renal/GU negative Renal ROS  negative genitourinary   Musculoskeletal  (+) Arthritis -, Osteoarthritis,    Abdominal   Peds  Hematology negative hematology ROS (+) anemia ,   Anesthesia Other Findings   Reproductive/Obstetrics negative OB ROS                          Anesthesia Physical Anesthesia Plan  ASA: III  Anesthesia Plan: General   Post-op Pain Management:    Induction: Intravenous  Airway Management Planned: LMA  Additional Equipment:   Intra-op Plan:   Post-operative Plan: Extubation in OR  Informed Consent: I have reviewed the patients History and Physical, chart, labs and discussed the procedure including the risks, benefits and alternatives for the proposed anesthesia with the patient or authorized representative who has indicated his/her understanding and acceptance.   Dental advisory given  Plan Discussed with: CRNA  Anesthesia Plan Comments:         Anesthesia Quick Evaluation

## 2014-02-03 ENCOUNTER — Encounter (HOSPITAL_COMMUNITY): Payer: Self-pay | Admitting: Orthopaedic Surgery

## 2014-02-06 ENCOUNTER — Ambulatory Visit (INDEPENDENT_AMBULATORY_CARE_PROVIDER_SITE_OTHER): Payer: Medicare Other | Admitting: Physician Assistant

## 2014-02-06 ENCOUNTER — Ambulatory Visit (INDEPENDENT_AMBULATORY_CARE_PROVIDER_SITE_OTHER): Payer: Medicare Other | Admitting: Pharmacist Clinician (PhC)/ Clinical Pharmacy Specialist

## 2014-02-06 ENCOUNTER — Encounter: Payer: Self-pay | Admitting: Physician Assistant

## 2014-02-06 VITALS — BP 112/58 | HR 48 | Ht 66.0 in | Wt 189.7 lb

## 2014-02-06 DIAGNOSIS — I480 Paroxysmal atrial fibrillation: Secondary | ICD-10-CM

## 2014-02-06 DIAGNOSIS — I2510 Atherosclerotic heart disease of native coronary artery without angina pectoris: Secondary | ICD-10-CM

## 2014-02-06 DIAGNOSIS — E1165 Type 2 diabetes mellitus with hyperglycemia: Secondary | ICD-10-CM

## 2014-02-06 DIAGNOSIS — I2583 Coronary atherosclerosis due to lipid rich plaque: Secondary | ICD-10-CM

## 2014-02-06 DIAGNOSIS — IMO0002 Reserved for concepts with insufficient information to code with codable children: Secondary | ICD-10-CM

## 2014-02-06 DIAGNOSIS — Z7901 Long term (current) use of anticoagulants: Secondary | ICD-10-CM

## 2014-02-06 DIAGNOSIS — I10 Essential (primary) hypertension: Secondary | ICD-10-CM

## 2014-02-06 DIAGNOSIS — I5032 Chronic diastolic (congestive) heart failure: Secondary | ICD-10-CM

## 2014-02-06 DIAGNOSIS — E785 Hyperlipidemia, unspecified: Secondary | ICD-10-CM

## 2014-02-06 DIAGNOSIS — E118 Type 2 diabetes mellitus with unspecified complications: Secondary | ICD-10-CM

## 2014-02-06 LAB — POCT INR: INR: 2.1

## 2014-02-06 NOTE — Assessment & Plan Note (Signed)
INR today is 2.1

## 2014-02-06 NOTE — Assessment & Plan Note (Signed)
atient appears euvolemic.

## 2014-02-06 NOTE — Assessment & Plan Note (Signed)
Patient is maintaining sinus bradycardia with a rate of 48 bpm with a first-degree AV block of 268 ms. He has reported some occasional lightheadedness. I'm going to decrease his metoprolol from 100 mg daily to 50 mg daily.  He is still on diltiazem and flecainide.

## 2014-02-06 NOTE — Assessment & Plan Note (Signed)
Pressure well controlled.

## 2014-02-06 NOTE — Assessment & Plan Note (Signed)
No angina Stable.  

## 2014-02-06 NOTE — Assessment & Plan Note (Signed)
He is on a statin.  Lipid Panel     Component Value Date/Time   CHOL 155 07/22/2013 0750   TRIG 229* 07/22/2013 0750   HDL 37* 07/22/2013 0750   CHOLHDL 4.2 07/22/2013 0750   VLDL 46* 07/22/2013 0750   LDLCALC 72 07/22/2013 0750

## 2014-02-06 NOTE — Patient Instructions (Signed)
Your physician recommends that you schedule a follow-up appointment in: April with Dr [name]  Your physician has recommended you make the following change in your medication: Decrease Metoprolol to 50 mg daily

## 2014-02-06 NOTE — Progress Notes (Signed)
Date:  02/06/2014   ID:  [name], DOB 03-May-1940, MRN [id number]  PCP:  [name], MD  Primary Cardiologist:  [name]    History of Present Illness: [name] is a 73 y.o. male  PMH below who presents today for six-month followup. He first saw Dr. [name] with dyspnea and chest discomfort, his ECG suggested possible ST elevations however, was not having symptoms of chest pain at that time. He was in rapid atrial fibrillation. He was sent for urgent catheterization which did not reveal any significant coronary disease. His ejection fraction has been preserved as well. His major cardiac issue has been related to atrial fibrillation which subsequently developed atrial flutter when he presented with acute cholecystitis. Following treatment of cholecystitis with cholecystectomy, he underwent atrial flutter ablation, but has had recurrent atrial fibrillation that is well-controlled now on flecainide. He is currently anticoagulated on warfarin. Unfortunately he quit smoking initially and then has gone back to smoking.  Interval History:   She presents today for six-month evaluation. He recently had bilateral carpal tunnel release. He states he's had occasional lightheadedness and SOB and very rare left axillary/lateral chest pain. He otherwise, denies nausea, vomiting, fever, chest pain, shortness of breath, orthopnea, dizziness, PND, cough, congestion, abdominal pain, hematochezia, melena, lower extremity edema, claudication.  Wt Readings from Last 3 Encounters:  02/06/14 189 lb 11.2 oz (86.047 kg)  01/30/14 190 lb (86.183 kg)  01/22/14 190 lb (86.183 kg)     Past Medical History  Diagnosis Date  . Atrial flutter 08/05/2010    Status post caval tricuspid isthmus ablation by Dr. Midge Aver Metoprolol daily  . PAF (paroxysmal atrial fibrillation) 2012    Recurrent after atrial flutter, currently controlled on flecainide plus diltiazem  . Hyperlipidemia     takes  Pravastatin daily  . Diastolic CHF, chronic     preserved EF 55%; exacerbated by A. fib/A. flutter  . Coronary artery disease, non-occlusive January 2012    Nonocclusive disease by cath, performed for ST elevations on ECG.  . COPD (chronic obstructive pulmonary disease)   . Cervical radiculopathy due to degenerative joint disease of spine   . Claudication     lower ext dopplers 08/18/11-normal ABIs bilaterally with normal triphasic waveforms  . Arthritis   . HTN (hypertension)     takes Diltiazem daily  . Diabetes mellitus type II     takes Metformin and Lantus daily  . Dysrhythmia     HX OF ATRIAL IFB /FLUTTER takes Flecanide and Coumadin daily  . Pneumonia     several years ago  . History of bronchitis     1998  . Weakness     numbness and tingling both hands  . Joint pain   . History of gastric ulcer   . Anemia     takes Ferrous Sulfate daily  . History of blood transfusion 1982    no abnormal reaction noted    Current Outpatient Prescriptions  Medication Sig Dispense Refill  . aspirin 81 MG chewable tablet Chew 81 mg by mouth daily.    [name] diltiazem (CARDIZEM CD) 240 MG 24 hr capsule Take 240 mg by mouth daily.    . ferrous sulfate 325 (65 FE) MG tablet Take 325 mg by mouth daily with breakfast.    . flecainide (TAMBOCOR) 150 MG tablet Take 225 mg by mouth 2 (two) times daily.    [name] HYDROcodone-acetaminophen (NORCO) 5-325 MG per tablet Take 1-2 tablets by mouth every 6 (six) hours as  needed. 90 tablet 0  . HYDROcodone-acetaminophen (NORCO/VICODIN) 5-325 MG per tablet Take 1 tablet by mouth every 6 (six) hours as needed for moderate pain. 28 tablet 0  . hydrocortisone cream 1 % Apply 1 application topically 2 (two) times daily as needed for itching.    . insulin glargine (LANTUS SOLOSTAR) 100 UNIT/ML injection Inject 6 Units into the skin 2 (two) times daily as needed (blood sugar).     [name] loratadine (CLARITIN) 10 MG tablet Take 10 mg by mouth every morning.     . metFORMIN  (GLUCOPHAGE) 1000 MG tablet Take 1,000 mg by mouth 2 (two) times daily with a meal.     . metoprolol succinate (TOPROL-XL) 50 MG 24 hr tablet Take 50 mg by mouth daily with supper. Take with or immediately following a meal.    . nitroGLYCERIN (NITROSTAT) 0.4 MG SL tablet Place 0.4 mg under the tongue every 5 (five) minutes x 3 doses as needed. For chest pain.    . pravastatin (PRAVACHOL) 40 MG tablet Take 40 mg by mouth daily.    [name] warfarin (COUMADIN) 5 MG tablet Take 5-7.5 mg by mouth daily. Take 5mg  on Tuesday, Thursday, Saturday and Sunday.  Take 7.5mg  on Monday, Wednesday, and Friday     No current facility-administered medications for this visit.    Allergies:   No Known Allergies  Social History:  The patient  reports that he has been smoking Cigarettes.  He has a 25 pack-year smoking history. He has never used smokeless tobacco. He reports that he does not drink alcohol or use illicit drugs.   Family history:   Family History  Problem Relation Age of Onset  . Cancer Mother   . Heart attack Father   . Heart attack Brother     ROS:  Please see the history of present illness.  All other systems reviewed and negative.   PHYSICAL EXAM: VS:  BP 112/58 mmHg  Pulse 48  Ht 5\' 6"  (1.676 m)  Wt 189 lb 11.2 oz (86.047 kg)  BMI 30.63 kg/m2 Well nourished, well developed, in no acute distress HEENT: Pupils are equal round react to light accommodation extraocular movements are intact.  Neck: no JVDNo cervical lymphadenopathy. Cardiac: Regular rate and rhythm without murmurs rubs or gallops. Lungs:  clear to auscultation bilaterally, no wheezing, rhonchi or rales Abd: soft, nontender, positive bowel sounds all quadrants, no hepatosplenomegaly Ext: no lower extremity edema.  2+ radial and dorsalis pedis pulses. Skin: warm and dry Neuro:  Grossly normal  EKG:  Sinus bradycardia rate 48 bpm degree AV block   ASSESSMENT AND PLAN:  Problem List Items Addressed This Visit    CAD  (coronary artery disease) (Chronic)    No angina. Stable    Chronic diastolic congestive heart failure (Chronic)    atient appears euvolemic.    DM (diabetes mellitus), type 2, uncontrolled with complications   Hyperlipidemia (Chronic)    He is on a statin.  Lipid Panel     Component Value Date/Time   CHOL 155 07/22/2013 0750   TRIG 229* 07/22/2013 0750   HDL 37* 07/22/2013 0750   CHOLHDL 4.2 07/22/2013 0750   VLDL 46* 07/22/2013 0750   LDLCALC 72 07/22/2013 0750        Hypertension - Primary (Chronic)    Pressure well-controlled.      Long term current use of anticoagulant therapy (Chronic)    INR today is 2.1    PAF (paroxysmal atrial fibrillation), maintaining SR (Chronic)  Patient is maintaining sinus bradycardia with a rate of 48 bpm with a first-degree AV block of 268 ms. He has reported some occasional lightheadedness. I'm going to decrease his metoprolol from 100 mg daily to 50 mg daily.  He is still on diltiazem and flecainide.

## 2014-02-09 ENCOUNTER — Ambulatory Visit (INDEPENDENT_AMBULATORY_CARE_PROVIDER_SITE_OTHER): Payer: Medicare Other | Admitting: Pharmacist Clinician (PhC)/ Clinical Pharmacy Specialist

## 2014-02-09 DIAGNOSIS — Z7901 Long term (current) use of anticoagulants: Secondary | ICD-10-CM

## 2014-02-09 DIAGNOSIS — I480 Paroxysmal atrial fibrillation: Secondary | ICD-10-CM

## 2014-02-09 LAB — POCT INR: INR: 2.3

## 2014-02-15 ENCOUNTER — Encounter (HOSPITAL_COMMUNITY): Payer: Self-pay

## 2014-02-15 ENCOUNTER — Other Ambulatory Visit: Payer: Self-pay

## 2014-02-15 ENCOUNTER — Emergency Department (HOSPITAL_COMMUNITY): Payer: Medicare Other

## 2014-02-15 ENCOUNTER — Observation Stay (HOSPITAL_COMMUNITY)
Admission: EM | Admit: 2014-02-15 | Discharge: 2014-02-17 | Disposition: A | Discharge status: Home / Self Care | Payer: Medicare Other | Attending: Internal Medicine | Admitting: Internal Medicine

## 2014-02-15 DIAGNOSIS — Z720 Tobacco use: Secondary | ICD-10-CM

## 2014-02-15 DIAGNOSIS — E119 Type 2 diabetes mellitus without complications: Secondary | ICD-10-CM

## 2014-02-15 DIAGNOSIS — I10 Essential (primary) hypertension: Secondary | ICD-10-CM | POA: Insufficient documentation

## 2014-02-15 DIAGNOSIS — I739 Peripheral vascular disease, unspecified: Secondary | ICD-10-CM | POA: Diagnosis not present

## 2014-02-15 DIAGNOSIS — R0789 Other chest pain: Secondary | ICD-10-CM

## 2014-02-15 DIAGNOSIS — R531 Weakness: Secondary | ICD-10-CM | POA: Diagnosis not present

## 2014-02-15 DIAGNOSIS — R079 Chest pain, unspecified: Secondary | ICD-10-CM

## 2014-02-15 DIAGNOSIS — I5032 Chronic diastolic (congestive) heart failure: Secondary | ICD-10-CM | POA: Diagnosis not present

## 2014-02-15 DIAGNOSIS — R55 Syncope and collapse: Principal | ICD-10-CM | POA: Diagnosis present

## 2014-02-15 DIAGNOSIS — I4892 Unspecified atrial flutter: Secondary | ICD-10-CM | POA: Insufficient documentation

## 2014-02-15 DIAGNOSIS — R42 Dizziness and giddiness: Secondary | ICD-10-CM | POA: Diagnosis present

## 2014-02-15 DIAGNOSIS — J449 Chronic obstructive pulmonary disease, unspecified: Secondary | ICD-10-CM | POA: Diagnosis not present

## 2014-02-15 DIAGNOSIS — Z794 Long term (current) use of insulin: Secondary | ICD-10-CM | POA: Insufficient documentation

## 2014-02-15 DIAGNOSIS — Z7982 Long term (current) use of aspirin: Secondary | ICD-10-CM | POA: Diagnosis not present

## 2014-02-15 DIAGNOSIS — Z7901 Long term (current) use of anticoagulants: Secondary | ICD-10-CM | POA: Diagnosis not present

## 2014-02-15 DIAGNOSIS — I480 Paroxysmal atrial fibrillation: Secondary | ICD-10-CM | POA: Diagnosis not present

## 2014-02-15 HISTORY — DX: Other abnormalities of gait and mobility: R26.89

## 2014-02-15 LAB — PROTIME-INR
INR: 2.15 — AB (ref 0.00–1.49)
PROTHROMBIN TIME: 24.2 s — AB (ref 11.6–15.2)

## 2014-02-15 LAB — BASIC METABOLIC PANEL
ANION GAP: 13 (ref 5–15)
BUN: 13 mg/dL (ref 6–23)
CHLORIDE: 101 meq/L (ref 96–112)
CO2: 22 mEq/L (ref 19–32)
Calcium: 8.4 mg/dL (ref 8.4–10.5)
Creatinine, Ser: 0.97 mg/dL (ref 0.50–1.35)
GFR calc non Af Amer: 79 mL/min — ABNORMAL LOW (ref >90)
Glucose, Bld: 150 mg/dL — ABNORMAL HIGH (ref 70–99)
POTASSIUM: 4.3 meq/L (ref 3.7–5.3)
Sodium: 136 mEq/L — ABNORMAL LOW (ref 137–147)

## 2014-02-15 LAB — CBC
HCT: 38.4 % — ABNORMAL LOW (ref 39.0–52.0)
Hemoglobin: 12.6 g/dL — ABNORMAL LOW (ref 13.0–17.0)
MCH: 28.9 pg (ref 26.0–34.0)
MCHC: 32.8 g/dL (ref 30.0–36.0)
MCV: 88.1 fL (ref 78.0–100.0)
PLATELETS: 201 10*3/uL (ref 150–400)
RBC: 4.36 MIL/uL (ref 4.22–5.81)
RDW: 13.5 % (ref 11.5–15.5)
WBC: 7.9 10*3/uL (ref 4.0–10.5)

## 2014-02-15 LAB — URINE MICROSCOPIC-ADD ON

## 2014-02-15 LAB — URINALYSIS, ROUTINE W REFLEX MICROSCOPIC
Bilirubin Urine: NEGATIVE
Glucose, UA: NEGATIVE mg/dL
Hgb urine dipstick: NEGATIVE
Ketones, ur: NEGATIVE mg/dL
NITRITE: NEGATIVE
PROTEIN: NEGATIVE mg/dL
SPECIFIC GRAVITY, URINE: 1.015 (ref 1.005–1.030)
UROBILINOGEN UA: 1 mg/dL (ref 0.0–1.0)
pH: 7 (ref 5.0–8.0)

## 2014-02-15 LAB — GLUCOSE, CAPILLARY: Glucose-Capillary: 173 mg/dL — ABNORMAL HIGH (ref 70–99)

## 2014-02-15 LAB — TROPONIN I: Troponin I: <0.3 ng/mL (ref <0.30)

## 2014-02-15 LAB — CBG MONITORING, ED: Glucose-Capillary: 124 mg/dL — ABNORMAL HIGH (ref 70–99)

## 2014-02-15 MED ORDER — SODIUM CHLORIDE 0.9 % IV BOLUS (SEPSIS)
500.0000 mL | Freq: Once | INTRAVENOUS | Status: AC
  Administered 2014-02-15: 500 mL via INTRAVENOUS

## 2014-02-15 MED ORDER — INSULIN ASPART 100 UNIT/ML ~~LOC~~ SOLN
0.0000 [IU] | Freq: Three times a day (TID) | SUBCUTANEOUS | Status: DC
  Administered 2014-02-16: 1 [IU] via SUBCUTANEOUS
  Administered 2014-02-16 (×2): 2 [IU] via SUBCUTANEOUS
  Administered 2014-02-17: 1 [IU] via SUBCUTANEOUS
  Administered 2014-02-17: 2 [IU] via SUBCUTANEOUS

## 2014-02-15 MED ORDER — NITROGLYCERIN 0.4 MG SL SUBL: 0.4000 mg | SUBLINGUAL_TABLET | SUBLINGUAL | Status: DC | PRN

## 2014-02-15 MED ORDER — WARFARIN - PHARMACIST DOSING INPATIENT: Freq: Every day | Status: DC

## 2014-02-15 MED ORDER — INSULIN ASPART 100 UNIT/ML ~~LOC~~ SOLN
0.0000 [IU] | Freq: Every day | SUBCUTANEOUS | Status: DC
  Administered 2014-02-16: 2 [IU] via SUBCUTANEOUS

## 2014-02-15 MED ORDER — SODIUM CHLORIDE 0.9 % IV SOLN
INTRAVENOUS | Status: AC
  Administered 2014-02-16: 03:00:00 via INTRAVENOUS

## 2014-02-15 MED ORDER — DILTIAZEM HCL ER COATED BEADS 120 MG PO CP24
120.0000 mg | ORAL_CAPSULE | Freq: Every day | ORAL | Status: DC
  Administered 2014-02-15 – 2014-02-17 (×2): 120 mg via ORAL
  Filled 2014-02-15 (×3): qty 1

## 2014-02-15 MED ORDER — INSULIN GLARGINE 100 UNIT/ML ~~LOC~~ SOLN
6.0000 [IU] | Freq: Every day | SUBCUTANEOUS | Status: DC
  Administered 2014-02-15 – 2014-02-16 (×2): 6 [IU] via SUBCUTANEOUS
  Filled 2014-02-15 (×4): qty 0.06

## 2014-02-15 MED ORDER — SODIUM CHLORIDE 0.9 % IJ SOLN
3.0000 mL | Freq: Two times a day (BID) | INTRAMUSCULAR | Status: DC
  Administered 2014-02-16 – 2014-02-17 (×4): 3 mL via INTRAVENOUS

## 2014-02-15 MED ORDER — TRAMADOL HCL 50 MG PO TABS
50.0000 mg | ORAL_TABLET | Freq: Four times a day (QID) | ORAL | Status: DC | PRN
  Administered 2014-02-16 – 2014-02-17 (×4): 50 mg via ORAL
  Filled 2014-02-15 (×5): qty 1

## 2014-02-15 MED ORDER — SODIUM CHLORIDE 0.9 % IV BOLUS (SEPSIS)
1000.0000 mL | Freq: Once | INTRAVENOUS | Status: AC
  Administered 2014-02-15: 1000 mL via INTRAVENOUS

## 2014-02-15 MED ORDER — FERROUS SULFATE 325 (65 FE) MG PO TABS
325.0000 mg | ORAL_TABLET | Freq: Every day | ORAL | Status: DC
Start: 2014-02-16 — End: 2014-02-17
  Administered 2014-02-16 – 2014-02-17 (×2): 325 mg via ORAL
  Filled 2014-02-15 (×4): qty 1

## 2014-02-15 MED ORDER — CEPHALEXIN 500 MG PO CAPS
500.0000 mg | ORAL_CAPSULE | Freq: Four times a day (QID) | ORAL | Status: DC
  Administered 2014-02-15 – 2014-02-17 (×7): 500 mg via ORAL
  Filled 2014-02-15 (×9): qty 1

## 2014-02-15 MED ORDER — MUPIROCIN 2 % EX OINT
1.0000 | TOPICAL_OINTMENT | Freq: Every day | CUTANEOUS | Status: DC
Start: 2014-02-16 — End: 2014-02-17
  Administered 2014-02-15 – 2014-02-17 (×3): 1 via TOPICAL
  Filled 2014-02-15 (×2): qty 22

## 2014-02-15 MED ORDER — PRAVASTATIN SODIUM 40 MG PO TABS
40.0000 mg | ORAL_TABLET | Freq: Every day | ORAL | Status: DC
  Administered 2014-02-15 – 2014-02-17 (×3): 40 mg via ORAL
  Filled 2014-02-15 (×3): qty 1

## 2014-02-15 MED ORDER — WARFARIN SODIUM 5 MG PO TABS
5.0000 mg | ORAL_TABLET | ORAL | Status: AC
  Administered 2014-02-15: 5 mg via ORAL
  Filled 2014-02-15: qty 1

## 2014-02-15 MED ORDER — ASPIRIN 81 MG PO CHEW
81.0000 mg | CHEWABLE_TABLET | Freq: Every day | ORAL | Status: DC
  Administered 2014-02-15 – 2014-02-17 (×3): 81 mg via ORAL
  Filled 2014-02-15 (×3): qty 1

## 2014-02-15 MED ORDER — FLECAINIDE ACETATE 50 MG PO TABS
225.0000 mg | ORAL_TABLET | Freq: Two times a day (BID) | ORAL | Status: DC
  Administered 2014-02-15 – 2014-02-17 (×4): 225 mg via ORAL
  Filled 2014-02-15 (×5): qty 1

## 2014-02-15 NOTE — ED Notes (Signed)
Heart healthy dinner tray ordered 
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
## 2014-02-15 NOTE — ED Notes (Signed)
Pt angry because he hasn't been fed all day. This RN explained to the patient that all patients are kept NPO until a plan of care is in process. Pt has been told that a meal tray has been ordered. Service response has been called in regards to the tray and the tray is "in process."
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
## 2014-02-15 NOTE — Progress Notes (Signed)
Pt admitted to room 2W13 from ER Pt is alert and oriented x4, able to follow commands, denies any pain at this time. VSS, no s/s of respiratory distress on RA. Standard hospital orientation completed, bed in lowest position, call bell is within reach. Will continue to monitor pt closely.

## 2014-02-15 NOTE — Progress Notes (Signed)
ANTICOAGULATION CONSULT NOTE - Initial Consult  Pharmacy Consult for Coumadin Indication: atrial fibrillation  No Known Allergies  Patient Measurements:   Weight 86 kg Height 5'6"  Vital Signs: Temp: 98.1 F (36.7 C) (11/22 2106) Temp Source: Oral (11/22 2106) BP: 98/48 mmHg (11/22 2106) Pulse Rate: 71 (11/22 2106)  Labs:  Recent Labs  02/15/14 0813  HGB 12.6*  HCT 38.4*  PLT 201  LABPROT 24.2*  INR 2.15*  CREATININE 0.97  TROPONINI <0.30    Estimated Creatinine Clearance: 68.7 mL/min (by C-G formula based on Cr of 0.97).   Medical History: Past Medical History  Diagnosis Date  . Atrial flutter     a. 07/2010 Status post caval tricuspid isthmus ablation by Dr. Midge Aver Metoprolol daily  . PAF (paroxysmal atrial fibrillation)     a. Recurrent after atrial flutter, currently controlled on flecainide plus diltiazem  . Hyperlipidemia     takes Pravastatin daily  . Diastolic CHF, chronic     a. 12/2012 EF 55-60%, diast dysfxn, triv MR, mildly dil LA/RA.  [name] Coronary artery disease, non-occlusive     a. 03/2010 Nonocclusive disease by cath, performed for ST elevations on ECG;  b. 06/2013 Lexi MV: EF 60%, no ischemia.  [name] COPD (chronic obstructive pulmonary disease)   . Cervical radiculopathy due to degenerative joint disease of spine   . Claudication     a. lower ext dopplers 08/18/11-normal ABIs bilaterally with normal triphasic waveforms  . Arthritis   . HTN (hypertension)     takes Diltiazem daily  . Diabetes mellitus type II     takes Metformin and Lantus daily  . Dysrhythmia     HX OF ATRIAL IFB /FLUTTER takes Flecanide and Coumadin daily  . Pneumonia     several years ago  . History of bronchitis     1998  . Weakness     numbness and tingling both hands  . Joint pain   . History of gastric ulcer   . Anemia     takes Ferrous Sulfate daily  . History of blood transfusion 1982    no abnormal reaction noted    Medications:  Prescriptions prior to  admission  Medication Sig Dispense Refill Last Dose  . aspirin 81 MG chewable tablet Chew 81 mg by mouth daily.   02/14/2014 at Unknown time  . cephALEXin (KEFLEX) 500 MG capsule Take 500 mg by mouth 4 (four) times daily.    02/15/2014 at 0600  . diltiazem (CARDIZEM CD) 240 MG 24 hr capsule Take 240 mg by mouth daily.   02/14/2014 at Unknown time  . ferrous sulfate 325 (65 FE) MG tablet Take 325 mg by mouth daily with breakfast.   02/14/2014 at Unknown time  . flecainide (TAMBOCOR) 150 MG tablet Take 225 mg by mouth 2 (two) times daily.   02/14/2014 at Unknown time  . HYDROcodone-acetaminophen (NORCO) 5-325 MG per tablet Take 1-2 tablets by mouth every 6 (six) hours as needed. (Patient taking differently: Take 1-2 tablets by mouth every 6 (six) hours as needed (pain). ) 90 tablet 0 02/14/2014 at Unknown time  . hydrocortisone cream 1 % Apply 1 application topically 2 (two) times daily as needed for itching.   unk at Unknown time  . insulin glargine (LANTUS SOLOSTAR) 100 UNIT/ML injection Inject 3 Units into the skin 4 (four) times daily.    02/15/2014 at Unknown time  . loratadine (CLARITIN) 10 MG tablet Take 10 mg by mouth every morning.    02/14/2014  at Unknown time  . metFORMIN (GLUCOPHAGE) 1000 MG tablet Take 1,000 mg by mouth 2 (two) times daily with a meal.    02/14/2014 at Unknown time  . metoprolol succinate (TOPROL-XL) 50 MG 24 hr tablet Take 50 mg by mouth daily with supper. Take with or immediately following a meal.   02/14/2014 at 1830  . mupirocin ointment (BACTROBAN) 2 % Apply 1 application topically daily.    02/15/2014 at Unknown time  . nitroGLYCERIN (NITROSTAT) 0.4 MG SL tablet Place 0.4 mg under the tongue every 5 (five) minutes x 3 doses as needed for chest pain.    unk  . pravastatin (PRAVACHOL) 40 MG tablet Take 40 mg by mouth daily.   02/14/2014 at Unknown time  . warfarin (COUMADIN) 5 MG tablet Take 5-7.5 mg by mouth daily. Take 5mg  on Tuesday, Thursday, Saturday and Sunday.   Take 7.5mg  on Monday, Wednesday, and Friday   02/14/2014 at Unknown time  . HYDROcodone-acetaminophen (NORCO/VICODIN) 5-325 MG per tablet Take 1 tablet by mouth every 6 (six) hours as needed for moderate pain. (Patient not taking: Reported on 02/15/2014) 28 tablet 0 Taking  . traMADol (ULTRAM) 50 MG tablet Take 50 mg by mouth every 6 (six) hours as needed (pain).        Assessment: 73 yo M presented to ED this morning with weakness, dizziness, unsteady gait which has progressively worsened over the last several months.  Pt has a PMH significant for afib and is on metoprolol, diltiazem, flecainide, and coumadin.  His INR today is therapeutic.  Home Coumadin dose = 5mg  TTSS and 7.5mg  MWF - last dose 11/21  Goal of Therapy:  INR 2-3 Monitor platelets by anticoagulation protocol: Yes   Plan:  Coumadin 5mg  PO x 1 tonight. Daily INR for now.  [employer], Pharm.D., BCPS Clinical Pharmacist Pager [phone] 02/15/2014 9:32 PM

## 2014-02-15 NOTE — ED Notes (Signed)
Pt undressed, in gown, on monitor, continuous pulse oximetry and blood pressure cuff 

## 2014-02-15 NOTE — ED Notes (Signed)
[facility]- Pt is from home, was awoken by chest pain at approximately 0200. He was also experiencing dizziness and couldn't go back to sleep. EMS had an initial BP reading of 118/60  and HR of 64, pt became bradycardic in the field with HR in the 50's, pt also had drop in BP to 98/60. Initial CBG of 181, pt took 3 units of at home insulin. Recently had bilateral carpel tunnel surgery.

## 2014-02-15 NOTE — ED Notes (Signed)
Admitting physicians at bedside.

## 2014-02-16 ENCOUNTER — Encounter (HOSPITAL_COMMUNITY): Payer: Self-pay | Admitting: General Practice

## 2014-02-16 ENCOUNTER — Ambulatory Visit: Payer: Medicare Other | Admitting: Pharmacist Clinician (PhC)/ Clinical Pharmacy Specialist

## 2014-02-16 DIAGNOSIS — I952 Hypotension due to drugs: Secondary | ICD-10-CM

## 2014-02-16 DIAGNOSIS — R55 Syncope and collapse: Principal | ICD-10-CM

## 2014-02-16 DIAGNOSIS — I4891 Unspecified atrial fibrillation: Secondary | ICD-10-CM

## 2014-02-16 DIAGNOSIS — I4892 Unspecified atrial flutter: Secondary | ICD-10-CM

## 2014-02-16 DIAGNOSIS — R079 Chest pain, unspecified: Secondary | ICD-10-CM

## 2014-02-16 DIAGNOSIS — I5030 Unspecified diastolic (congestive) heart failure: Secondary | ICD-10-CM

## 2014-02-16 DIAGNOSIS — I10 Essential (primary) hypertension: Secondary | ICD-10-CM

## 2014-02-16 DIAGNOSIS — I517 Cardiomegaly: Secondary | ICD-10-CM

## 2014-02-16 HISTORY — PX: TRANSTHORACIC ECHOCARDIOGRAM: SHX275

## 2014-02-16 LAB — BASIC METABOLIC PANEL
ANION GAP: 13 (ref 5–15)
BUN: 10 mg/dL (ref 6–23)
CALCIUM: 8.2 mg/dL — AB (ref 8.4–10.5)
CO2: 22 mEq/L (ref 19–32)
CREATININE: 0.97 mg/dL (ref 0.50–1.35)
Chloride: 104 mEq/L (ref 96–112)
GFR calc non Af Amer: 79 mL/min — ABNORMAL LOW (ref >90)
Glucose, Bld: 149 mg/dL — ABNORMAL HIGH (ref 70–99)
Potassium: 3.8 mEq/L (ref 3.7–5.3)
Sodium: 139 mEq/L (ref 137–147)

## 2014-02-16 LAB — GLUCOSE, CAPILLARY
GLUCOSE-CAPILLARY: 204 mg/dL — AB (ref 70–99)
Glucose-Capillary: 160 mg/dL — ABNORMAL HIGH (ref 70–99)
Glucose-Capillary: 134 mg/dL — ABNORMAL HIGH (ref 70–99)
Glucose-Capillary: 159 mg/dL — ABNORMAL HIGH (ref 70–99)
Glucose-Capillary: 207 mg/dL — ABNORMAL HIGH (ref 70–99)

## 2014-02-16 LAB — CBC
HCT: 35 % — ABNORMAL LOW (ref 39.0–52.0)
Hemoglobin: 11.5 g/dL — ABNORMAL LOW (ref 13.0–17.0)
MCH: 29.1 pg (ref 26.0–34.0)
MCHC: 32.9 g/dL (ref 30.0–36.0)
MCV: 88.6 fL (ref 78.0–100.0)
PLATELETS: 161 10*3/uL (ref 150–400)
RBC: 3.95 MIL/uL — ABNORMAL LOW (ref 4.22–5.81)
RDW: 13.6 % (ref 11.5–15.5)
WBC: 3.3 10*3/uL — ABNORMAL LOW (ref 4.0–10.5)

## 2014-02-16 LAB — PROTIME-INR
INR: 1.72 — ABNORMAL HIGH (ref 0.00–1.49)
Prothrombin Time: 20.3 seconds — ABNORMAL HIGH (ref 11.6–15.2)

## 2014-02-16 LAB — TROPONIN I

## 2014-02-16 MED ORDER — HYDROCODONE-ACETAMINOPHEN 5-325 MG PO TABS
1.0000 | ORAL_TABLET | Freq: Once | ORAL | Status: AC
  Administered 2014-02-16: 1 via ORAL
  Filled 2014-02-16: qty 1

## 2014-02-16 MED ORDER — WARFARIN SODIUM 7.5 MG PO TABS
7.5000 mg | ORAL_TABLET | Freq: Once | ORAL | Status: AC
  Administered 2014-02-16: 7.5 mg via ORAL
  Filled 2014-02-16: qty 1

## 2014-02-16 NOTE — Consult Note (Signed)
Patient Name: [name] Date of Encounter: 02/16/2014  Active Problems:   Near syncope   Pre-syncope   Length of Stay: 1  SUBJECTIVE  73 y/o male with h/o paroxysmal aflutter and nonobstructive CAD who presented to the ED this AM with left sided chest pain and tenderness and dizziness. These symptoms have resolved. He has no complains today.  CURRENT MEDS . aspirin  81 mg Oral Daily  . cephALEXin  500 mg Oral QID  . diltiazem  120 mg Oral Daily  . ferrous sulfate  325 mg Oral Q breakfast  . flecainide  225 mg Oral BID  . insulin aspart  0-5 Units Subcutaneous QHS  . insulin aspart  0-9 Units Subcutaneous TID WC  . insulin glargine  6 Units Subcutaneous QHS  . mupirocin ointment  1 application Topical Daily  . pravastatin  40 mg Oral Daily  . sodium chloride  3 mL Intravenous Q12H  . warfarin  7.5 mg Oral ONCE-1800  . Warfarin - Pharmacist Dosing Inpatient   Does not apply q1800   OBJECTIVE  Filed Vitals:   02/15/14 1800 02/15/14 1837 02/15/14 2106 02/16/14 0328  BP: 109/71 98/43 98/48  101/51  Pulse: 87 73 71 69  Temp:  98.1 F (36.7 C) 98.1 F (36.7 C) 97.9 F (36.6 C)  TempSrc:  Oral Oral Oral  Resp:  18 18 18   Height:    5\' 6"  (1.676 m)  Weight:    190 lb 7.6 oz (86.4 kg)  SpO2: 92% 94% 95% 95%    Intake/Output Summary (Last 24 hours) at 02/16/14 1142 Last data filed at 02/16/14 0900  Gross per 24 hour  Intake    480 ml  Output   1225 ml  Net   -745 ml   Filed Weights   02/16/14 0328  Weight: 190 lb 7.6 oz (86.4 kg)   PHYSICAL EXAM  General: Pleasant, NAD. Neuro: Alert and oriented X 3. Moves all extremities spontaneously. Psych: Normal affect. HEENT:  Normal  Neck: Supple without bruits or JVD. Lungs:  Resp regular and unlabored, CTA. Heart: RRR no s3, s4, or murmurs. Abdomen: Soft, non-tender, non-distended, BS + x 4.  Extremities: No clubbing, cyanosis or edema. DP/PT/Radials 2+ and equal bilaterally.  Accessory Clinical  Findings  CBC  Recent Labs  02/15/14 0813 02/16/14 0319  WBC 7.9 3.3*  HGB 12.6* 11.5*  HCT 38.4* 35.0*  MCV 88.1 88.6  PLT 201 865   Basic Metabolic Panel  Recent Labs  02/15/14 0813 02/16/14 0319  NA 136* 139  K 4.3 3.8  CL 101 104  CO2 22 22  GLUCOSE 150* 149*  BUN 13 10  CREATININE 0.97 0.97  CALCIUM 8.4 8.2*   Cardiac Enzymes  Recent Labs  02/15/14 0813 02/15/14 2300 02/16/14 0319  TROPONINI <0.30 <0.30 <0.30   Radiology/Studies  Dg Chest 2 View  02/15/2014    IMPRESSION: Subtle increased density in the left retrocardiac region which may be due to vascular crowding although cannot exclude atelectasis or early infection.     Ct Head Wo Contrast  02/15/2014   CLINICAL DATA:  Recent episodes of dizziness, vertigo and gait instability.    IMPRESSION: Advanced small vessel disease with old lacunar infarcts, as above. There are no acute findings identified by head CT.    TELE: SR   ASSESSMENT AND PLAN   1. Left-sided chest and abdominal pain and dizzyness: Patient has a long history of this exact symptom with negative stress tests  in 2013 and again in April 2015 - this is reassuring. Pain is easily reproducible with palpation over his left lateral rib cage. His troponin is normal and ECG is nonacute. Given recent study in April of this year, would not pursue additional ischemic evaluation at this time.  2. Labile hypertension/hypotension: Blood pressure has been variable in the ER with recordings as low as 73 systolic and as high as 983. Patient has chronic lightheadedness and unsteady gait. Recommend reducing diltiazem to 120 mg daily seems to be working for him as his dizziness has resolved. His bradycardia has resolved with reduced dose of diltiazem as well.   3. Paroxysmal atrial fibrillation and flutter: INR is therapeutic and he is in sinus rhythm. As above, with hypotension, recommend reducing diltiazem to 120 mg daily.   4. Tob Abuse: Cessation  advised.  5. DM: Followed by PCP as outpt. Pt seems to have a good handle on it.  6. HL: Cont statin.  He can be discharged from cardiology standpoint, we will arrange for a follow up appointment with Dr [name].    Ena Dawley H MD, [facility] 02/16/2014

## 2014-02-16 NOTE — Progress Notes (Signed)
Echo Lab  2D Echocardiogram completed.  [place], RDCS 02/16/2014 10:52 AM

## 2014-02-16 NOTE — Plan of Care (Signed)
Problem: Phase I Progression Outcomes Goal: Pain controlled with appropriate interventions Outcome: Completed/Met Date Met:  02/16/14     

## 2014-02-16 NOTE — Progress Notes (Signed)
UR completed 

## 2014-02-16 NOTE — Progress Notes (Signed)
Subjective: Doing well. No problem over night. Had 1 bm. No fever,chills. No dizziness. Only had some "wobbly" feeling when walking or standing up. Worked with PT this morning, had mild staggering gait, which they thought was due to mild vestibular dysfunction. BP remains low and HR controlled in 80's, nsr. Denies palpitations, cp, sob.   Objective: Vital signs in last 24 hours: Filed Vitals:   02/15/14 1800 02/15/14 1837 02/15/14 2106 02/16/14 0328  BP: 109/71 98/43 98/48  101/51  Pulse: 87 73 71 69  Temp:  98.1 F (36.7 C) 98.1 F (36.7 C) 97.9 F (36.6 C)  TempSrc:  Oral Oral Oral  Resp:  18 18 18   Height:    5\' 6"  (1.676 m)  Weight:    86.4 kg (190 lb 7.6 oz)  SpO2: 92% 94% 95% 95%   Weight change:   Intake/Output Summary (Last 24 hours) at 02/16/14 1015 Last data filed at 02/16/14 0900  Gross per 24 hour  Intake    480 ml  Output   1225 ml  Net   -745 ml   Vitals reviewed. General: resting in bed, NAD HEENT: PERRL, EOMI, no scleral icterus Cardiac: RRR, no rubs, murmurs or gallops Pulm: clear to auscultation bilaterally, no wheezes, rales, or rhonchi Abd: soft, nontender, nondistended, BS present Ext: warm and well perfused, no pedal edema. Both wrist has carpal tunnel sx incision site that's healing well.  Neuro: alert and oriented X3, cranial nerves II-XII grossly intact, strength and sensation to light touch equal in bilateral upper and lower extremities. Cerebellar exam normal.  Lab Results: Basic Metabolic Panel:  Recent Labs Lab 02/15/14 0813 02/16/14 0319  NA 136* 139  K 4.3 3.8  CL 101 104  CO2 22 22  GLUCOSE 150* 149*  BUN 13 10  CREATININE 0.97 0.97  CALCIUM 8.4 8.2*   Liver Function Tests: No results for input(s): AST, ALT, ALKPHOS, BILITOT, PROT, ALBUMIN in the last 168 hours. No results for input(s): LIPASE, AMYLASE in the last 168 hours. No results for input(s): AMMONIA in the last 168 hours. CBC:  Recent Labs Lab 02/15/14 0813  02/16/14 0319  WBC 7.9 3.3*  HGB 12.6* 11.5*  HCT 38.4* 35.0*  MCV 88.1 88.6  PLT 201 161   Cardiac Enzymes:  Recent Labs Lab 02/15/14 0813 02/15/14 2300 02/16/14 0319  TROPONINI <0.30 <0.30 <0.30   BNP: No results for input(s): PROBNP in the last 168 hours. D-Dimer: No results for input(s): DDIMER in the last 168 hours. CBG:  Recent Labs Lab 02/15/14 1746 02/15/14 2126 02/16/14 0608  GLUCAP 124* 173* 160*   Hemoglobin A1C: No results for input(s): HGBA1C in the last 168 hours. Fasting Lipid Panel: No results for input(s): CHOL, HDL, LDLCALC, TRIG, CHOLHDL, LDLDIRECT in the last 168 hours. Thyroid Function Tests: No results for input(s): TSH, T4TOTAL, FREET4, T3FREE, THYROIDAB in the last 168 hours. Coagulation:  Recent Labs Lab 02/15/14 0813 02/16/14 0319  LABPROT 24.2* 20.3*  INR 2.15* 1.72*   Anemia Panel: No results for input(s): VITAMINB12, FOLATE, FERRITIN, TIBC, IRON, RETICCTPCT in the last 168 hours. Urine Drug Screen: Drugs of Abuse     Component Value Date/Time   LABOPIA NEGATIVE 03/24/2010 0350   COCAINSCRNUR NEGATIVE 03/24/2010 0350   LABBENZ NEGATIVE 03/24/2010 0350   AMPHETMU NEGATIVE 03/24/2010 0350    Alcohol Level: No results for input(s): ETH in the last 168 hours. Urinalysis:  Recent Labs Lab 02/15/14 1014  COLORURINE YELLOW  LABSPEC 1.015  PHURINE 7.0  GLUCOSEU NEGATIVE  HGBUR NEGATIVE  BILIRUBINUR NEGATIVE  KETONESUR NEGATIVE  PROTEINUR NEGATIVE  UROBILINOGEN 1.0  NITRITE NEGATIVE  LEUKOCYTESUR SMALL*   Misc. Labs:  Micro Results: No results found for this or any previous visit (from the past 240 hour(s)). Studies/Results: Dg Chest 2 View  02/15/2014   CLINICAL DATA:  Chest pain beginning 6 weeks ago with weakness. Fainting spells beginning several months ago.  EXAM: CHEST  2 VIEW  COMPARISON:  01/08/2014  FINDINGS: The lungs are adequately inflated No evidence of effusion or pneumothorax. There is subtle increased  density in the left retrocardiac region. Cardiomediastinal silhouette and remainder of the exam is unchanged.  IMPRESSION: Subtle increased density in the left retrocardiac region which may be due to vascular crowding although cannot exclude atelectasis or early infection.   Electronically Signed   By: Marin Olp M.D.   On: 02/15/2014 09:57   Ct Head Wo Contrast  02/15/2014   CLINICAL DATA:  Recent episodes of dizziness, vertigo and gait instability.  EXAM: CT HEAD WITHOUT CONTRAST  TECHNIQUE: Contiguous axial images were obtained from the base of the skull through the vertex without intravenous contrast.  COMPARISON:  None.  FINDINGS: Moderately advanced small vessel ischemic changes are identified in the periventricular white matter. There are old lacunar infarcts involving the right caudate nucleus, right centrum semiovale and white matter near the posterior vertex. Mild cortical atrophy present. The brain demonstrates no evidence of hemorrhage, acute infarction, edema, mass effect, extra-axial fluid collection, hydrocephalus or mass lesion. The skull is unremarkable.  IMPRESSION: Advanced small vessel disease with old lacunar infarcts, as above. There are no acute findings identified by head CT.   Electronically Signed   By: Aletta Edouard M.D.   On: 02/15/2014 09:27   Medications: I have reviewed the patient's current medications. Scheduled Meds: . aspirin  81 mg Oral Daily  . cephALEXin  500 mg Oral QID  . diltiazem  120 mg Oral Daily  . ferrous sulfate  325 mg Oral Q breakfast  . flecainide  225 mg Oral BID  . insulin aspart  0-5 Units Subcutaneous QHS  . insulin aspart  0-9 Units Subcutaneous TID WC  . insulin glargine  6 Units Subcutaneous QHS  . mupirocin ointment  1 application Topical Daily  . pravastatin  40 mg Oral Daily  . sodium chloride  3 mL Intravenous Q12H  . Warfarin - Pharmacist Dosing Inpatient   Does not apply q1800   Continuous Infusions:  PRN Meds:.nitroGLYCERIN,  traMADol Assessment/Plan: Active Problems:   Near syncope   Pre-syncope  73 yo with Afib, diastolic CHF here with near syncope.  Pre-syncope - likely 2/2 to labile BP and gait instability. BP has been variable in ED 60-150's. orthostats negative.  - continue to work with PT and then home health PT. He has some mild vestibular dysfunction per PT eval, which would benefit from PT - cards reduced doze of cardizem to 120 (from 240). Also on coreg and flecanide for Afib.  - will monitor him and hi BP closely. Will likely d/c home with home PT if his BP stable.  Parox Afib - now in NSR. INR therapetuic.   -cont flecanide. Holding dilt and metoprolol in the setting of low BP. Can restart if tachycardic or BP goes up  Left sided chest pain - now resolved. Likely MSK, he had a fall few months ago. - trops neg, ekg unchanged.   DM II - hgba1c 7.0. Takes lantus 3 unit QID.  Total 12 units + metformin 1000 BID - lantus 6 unit daily + ssi.  S/p carpal tunnel sx - wound is healing per our exam but wound care thinks he may have some yellow necrotic tissue on both wrists.  - Cont keflex 5 days more (QID) - tramadol for pain 50mg  q6hr prn. - consult ortho tomorrow for their recs    Dispo: Disposition is deferred at this time, awaiting improvement of current medical problems.  Anticipated discharge in approximately 1-2 day(s).   The patient does have a current PCP [name], MD) and does need an [facility] hospital follow-up appointment after discharge.  The patient does have transportation limitations that hinder transportation to clinic appointments.  .Services Needed at time of discharge: Y = Yes, Blank = No PT:   OT:   RN:   Equipment:   Other:     LOS: 1 day   Dellia Nims, MD 02/16/2014, 10:15 AM

## 2014-02-16 NOTE — Progress Notes (Signed)
ANTICOAGULATION CONSULT NOTE - Follow Up Consult  Pharmacy Consult for Coumadin Indication: atrial fibrillation  No Known Allergies  Patient Measurements: Height: 5\' 6"  (167.6 cm) Weight: 190 lb 7.6 oz (86.4 kg) IBW/kg (Calculated) : 63.8 Heparin Dosing Weight:   Vital Signs: Temp: 97.9 F (36.6 C) (11/23 0328) Temp Source: Oral (11/23 0328) BP: 101/51 mmHg (11/23 0328) Pulse Rate: 69 (11/23 0328)  Labs:  Recent Labs  02/15/14 0813 02/15/14 2300 02/16/14 0319  HGB 12.6*  --  11.5*  HCT 38.4*  --  35.0*  PLT 201  --  161  LABPROT 24.2*  --  20.3*  INR 2.15*  --  1.72*  CREATININE 0.97  --  0.97  TROPONINI <0.30 <0.30 <0.30    Estimated Creatinine Clearance: 68.8 mL/min (by C-G formula based on Cr of 0.97).  Assessment: [name] continues on Coumadin for Afib. INR (1.72) is now subtherapeutic despite continuing PTA regimen (5mg  daily except 7.5mg  MWF) - will give 7.5mg  tonight as scheduled but may need increased dose tomorrow if INR does not respond.  - H/H and Plts drifted down - No significant bleeding reported  Goal of Therapy:  INR 2-3   Plan:  1. Coumadin 7.5mg  PO x 1 today 2. Follow-up AM INR  Earleen Newport  [phone] 02/16/2014,10:28 AM

## 2014-02-16 NOTE — Consult Note (Signed)
WOC wound consult note Reason for Consult: evaluation of surgical wounds, S/P carpel tunnel release, bilateral 01/30/14 per Dr. Erlinda Hong.  Wound type:  Surgical  Measurement: 2cm  Bilateral surgical incisions, Left/right wrist Wound bed: entire incision with 100% yellow skin necrosis that extends from the incision line 0.5cm on the right and 0.2cm x on the left  Drainage (amount, consistency, odor) moderate, serosanguinous  Periwound: intact, no significant erythema   Pt reports follow up with Dr. Erlinda Hong last week and at that time placed on PO antibiotics and given "cream" to put on the incisions.  He reports his daughter has been cleaning daily and applying "cream" at home.    WOC would recommend consultation with Dr. Erlinda Hong while inpatient, these wounds have skin necrosis and the right has full separation of the incision.  I have paged the admitting service to notify.  Will only order bedside nurse to clean with saline at this time and reapply dry dressing.  Discussed POC with patient and bedside nurse.  Re consult if needed, will not follow at this time. Thanks  Sidney Silberman Kellogg, [place] [phone])

## 2014-02-16 NOTE — Evaluation (Signed)
Physical Therapy Evaluation Patient Details Name: [name] MRN: [id number] DOB: [birth year] Today's Date: 02/16/2014   History of Present Illness  73 yo male with hx of aflutter (on coumadin), diastolic CHF (VF64-33%), COPD, HTN, DM II here with feeling weak, dizzy, unsteady, and stumbling for last several months which is progressively worsening. Had bil carpal tunnel release 01/30/14.  Clinical Impression  Pt admitted with the above. Pt currently with functional limitations due to the deficits listed below (see PT Problem List). Mild balance deficits noted with dynamic gait challenges today but did not require physical assist to correct. Feel he would greatly benefit from use of a cane for improved stability and follow up with HHPT to further improve his balance upon d/c. Pt will benefit from skilled PT to increase their independence and safety with mobility to allow discharge to the venue listed below.      Follow Up Recommendations Home health PT;Supervision - Intermittent    Equipment Recommendations  Cane    Recommendations for Other Services       Precautions / Restrictions Precautions Precautions: Fall Precaution Comments: no lifting > 2.5 lbs (per pt report) Restrictions Weight Bearing Restrictions: Yes      Mobility  Bed Mobility                  Transfers Overall transfer level: Modified independent               General transfer comment: performed from toilet and bed  Ambulation/Gait Ambulation/Gait assistance: Min guard Ambulation Distance (Feet): 375 Feet Assistive device: None Gait Pattern/deviations: Step-through pattern;Staggering left;Drifts right/left     General Gait Details: Pt with mild stagger and drift to left when tasked with dynamic gait challenges such as head turns and high marching, particularly notable with looking upward. Pt with some loss of balance during bout but able to self correct. Reported one instance of  "lightheadedness" but resolved with standing rest break.  Stairs Stairs: Yes Stairs assistance: Supervision Stair Management: One rail Right;Step to pattern;Forwards Number of Stairs: 4 General stair comments: Supervision for safety. Performed this task safely without physical assist. Cues to hold rail.  Wheelchair Mobility    Modified Rankin (Stroke Patients Only)       Balance Overall balance assessment: Needs assistance Sitting-balance support: No upper extremity supported;Feet supported Sitting balance-Leahy Scale: Good     Standing balance support: No upper extremity supported Standing balance-Leahy Scale: Fair                               Pertinent Vitals/Pain Pain Assessment: 0-10 Pain Score: 5  Pain Location: bil wrists Pain Descriptors / Indicators: Burning;Aching Pain Intervention(s): Monitored during session;Repositioned    Home Living Family/patient expects to be discharged to:: Private residence Living Arrangements: Children;Other relatives Available Help at Discharge: Family;Available PRN/intermittently (daugther and son work at night) Type of Home: House Home Access: Stairs to enter Entrance Stairs-Rails: Can reach Optician, dispensing of Steps: 3 Home Layout: One level Home Equipment: None      Prior Function Level of Independence: Independent               Hand Dominance   Dominant Hand: Right    Extremity/Trunk Assessment   Upper Extremity Assessment: Defer to OT evaluation           Lower Extremity Assessment: Overall WFL for tasks assessed         Communication  Communication: No difficulties  Cognition Arousal/Alertness: Awake/alert Behavior During Therapy: WFL for tasks assessed/performed Overall Cognitive Status: Within Functional Limits for tasks assessed                      General Comments      Exercises        Assessment/Plan    PT Assessment Patient needs  continued PT services  PT Diagnosis Abnormality of gait;Acute pain   PT Problem List Decreased activity tolerance;Decreased balance;Decreased mobility;Decreased knowledge of use of DME;Pain  PT Treatment Interventions DME instruction;Gait training;Stair training;Functional mobility training;Therapeutic activities;Therapeutic exercise;Balance training;Neuromuscular re-education;Patient/family education;Modalities   PT Goals (Current goals can be found in the Care Plan section) Acute Rehab PT Goals Patient Stated Goal: GO home PT Goal Formulation: With patient Time For Goal Achievement: 03/02/14 Potential to Achieve Goals: Good    Frequency Min 3X/week   Barriers to discharge Decreased caregiver support son works in evening    Co-evaluation               End of Session Equipment Utilized During Treatment: Gait belt Activity Tolerance: Patient tolerated treatment well Patient left: in bed;with call bell/phone within reach Nurse Communication: Mobility status    Functional Assessment Tool Used: clinical observation Functional Limitation: Mobility: Walking and moving around Mobility: Walking and Moving Around Current Status [phone]): At least 1 percent but less than 20 percent impaired, limited or restricted Mobility: Walking and Moving Around Goal Status [phone]): 0 percent impaired, limited or restricted    Time: 0855-0916 PT Time Calculation (min) (ACUTE ONLY): 21 min   Charges:   PT Evaluation $Initial PT Evaluation Tier I: 1 Procedure PT Treatments $Gait Training: 8-22 mins   PT G Codes:   Functional Assessment Tool Used: clinical observation Functional Limitation: Mobility: Walking and moving around    [name] 02/16/2014, 9:26 AM  [name], [place]

## 2014-02-17 LAB — CBC
HCT: 37.3 % — ABNORMAL LOW (ref 39.0–52.0)
Hemoglobin: 12.1 g/dL — ABNORMAL LOW (ref 13.0–17.0)
MCH: 29.1 pg (ref 26.0–34.0)
MCHC: 32.4 g/dL (ref 30.0–36.0)
MCV: 89.7 fL (ref 78.0–100.0)
PLATELETS: 168 10*3/uL (ref 150–400)
RBC: 4.16 MIL/uL — AB (ref 4.22–5.81)
RDW: 13.9 % (ref 11.5–15.5)
WBC: 4.1 10*3/uL (ref 4.0–10.5)

## 2014-02-17 LAB — PROTIME-INR
INR: 1.51 — ABNORMAL HIGH (ref 0.00–1.49)
Prothrombin Time: 18.3 seconds — ABNORMAL HIGH (ref 11.6–15.2)

## 2014-02-17 LAB — BASIC METABOLIC PANEL
Anion gap: 12 (ref 5–15)
BUN: 9 mg/dL (ref 6–23)
CALCIUM: 8.5 mg/dL (ref 8.4–10.5)
CO2: 23 mEq/L (ref 19–32)
Chloride: 102 mEq/L (ref 96–112)
Creatinine, Ser: 0.92 mg/dL (ref 0.50–1.35)
GFR, EST NON AFRICAN AMERICAN: 81 mL/min — AB (ref >90)
GLUCOSE: 183 mg/dL — AB (ref 70–99)
Potassium: 4 mEq/L (ref 3.7–5.3)
SODIUM: 137 meq/L (ref 137–147)

## 2014-02-17 LAB — GLUCOSE, CAPILLARY
Glucose-Capillary: 164 mg/dL — ABNORMAL HIGH (ref 70–99)
Glucose-Capillary: 149 mg/dL — ABNORMAL HIGH (ref 70–99)

## 2014-02-17 MED ORDER — DILTIAZEM HCL ER COATED BEADS 120 MG PO CP24: 120.0000 mg | ORAL_CAPSULE | Freq: Every day | ORAL | Status: DC

## 2014-02-17 NOTE — Discharge Instructions (Signed)
You were here because of pre-syncope likely from your blood pressure being low. We changed your medicines and you should continue to follow up with cardiologist and your primary doctor closely.  Please take 10 mg coumadin tonight and then 7.5 mg tomorrow. Then resume your regular schedule after that. Go to the clinic on Monday 02/23/14 to check your INR.  Also follow up with Dr. Erlinda Hong for your wrists.

## 2014-02-17 NOTE — Progress Notes (Signed)
Subjective: Doing well. No problem over night. Had 1 bm. No fever,chills. No dizziness.  BP remains low and HR controlled in 80's, nsr. Denies palpitations, cp, sob.   Objective: Vital signs in last 24 hours: Filed Vitals:   02/16/14 0328 02/16/14 1333 02/16/14 2102 02/17/14 0428  BP: 101/51 115/51 102/61 99/58  Pulse: 69 75  72  Temp: 97.9 F (36.6 C) 97.4 F (36.3 C) 97.8 F (36.6 C) 97.5 F (36.4 C)  TempSrc: Oral Oral Oral Oral  Resp: 18 18 18 18   Height: 5\' 6"  (1.676 m)     Weight: 86.4 kg (190 lb 7.6 oz)     SpO2: 95% 99% 95% 95%   Weight change:   Intake/Output Summary (Last 24 hours) at 02/17/14 0809 Last data filed at 02/17/14 0600  Gross per 24 hour  Intake    843 ml  Output   1050 ml  Net   -207 ml   Vitals reviewed. General: resting in bed, NAD HEENT: PERRL, EOMI, no scleral icterus Cardiac: RRR, no rubs, murmurs or gallops Pulm: clear to auscultation bilaterally, no wheezes, rales, or rhonchi Abd: soft, nontender, nondistended, BS present Ext: warm and well perfused, no pedal edema. Both wrist has carpal tunnel sx incision site that's healing well.  Neuro: alert and oriented X3, cranial nerves II-XII grossly intact, strength and sensation to light touch equal in bilateral upper and lower extremities. Cerebellar exam normal.  Lab Results: Basic Metabolic Panel:  Recent Labs Lab 02/16/14 0319 02/17/14 0417  NA 139 137  K 3.8 4.0  CL 104 102  CO2 22 23  GLUCOSE 149* 183*  BUN 10 9  CREATININE 0.97 0.92  CALCIUM 8.2* 8.5   Liver Function Tests: No results for input(s): AST, ALT, ALKPHOS, BILITOT, PROT, ALBUMIN in the last 168 hours. No results for input(s): LIPASE, AMYLASE in the last 168 hours. No results for input(s): AMMONIA in the last 168 hours. CBC:  Recent Labs Lab 02/16/14 0319 02/17/14 0417  WBC 3.3* 4.1  HGB 11.5* 12.1*  HCT 35.0* 37.3*  MCV 88.6 89.7  PLT 161 168   Cardiac Enzymes:  Recent Labs Lab 02/15/14 0813  02/15/14 2300 02/16/14 0319  TROPONINI <0.30 <0.30 <0.30   BNP: No results for input(s): PROBNP in the last 168 hours. D-Dimer: No results for input(s): DDIMER in the last 168 hours. CBG:  Recent Labs Lab 02/16/14 0608 02/16/14 1136 02/16/14 1610 02/16/14 2058 02/16/14 2119 02/17/14 0645  GLUCAP 160* 134* 159* 204* 207* 164*   Hemoglobin A1C: No results for input(s): HGBA1C in the last 168 hours. Fasting Lipid Panel: No results for input(s): CHOL, HDL, LDLCALC, TRIG, CHOLHDL, LDLDIRECT in the last 168 hours. Thyroid Function Tests: No results for input(s): TSH, T4TOTAL, FREET4, T3FREE, THYROIDAB in the last 168 hours. Coagulation:  Recent Labs Lab 02/15/14 0813 02/16/14 0319 02/17/14 0417  LABPROT 24.2* 20.3* 18.3*  INR 2.15* 1.72* 1.51*   Anemia Panel: No results for input(s): VITAMINB12, FOLATE, FERRITIN, TIBC, IRON, RETICCTPCT in the last 168 hours. Urine Drug Screen: Drugs of Abuse     Component Value Date/Time   LABOPIA NEGATIVE 03/24/2010 0350   COCAINSCRNUR NEGATIVE 03/24/2010 0350   LABBENZ NEGATIVE 03/24/2010 0350   AMPHETMU NEGATIVE 03/24/2010 0350    Alcohol Level: No results for input(s): ETH in the last 168 hours. Urinalysis:  Recent Labs Lab 02/15/14 1014  COLORURINE YELLOW  LABSPEC 1.015  PHURINE 7.0  GLUCOSEU NEGATIVE  HGBUR NEGATIVE  BILIRUBINUR NEGATIVE  KETONESUR NEGATIVE  PROTEINUR NEGATIVE  UROBILINOGEN 1.0  NITRITE NEGATIVE  LEUKOCYTESUR SMALL*   Misc. Labs:  Micro Results: No results found for this or any previous visit (from the past 240 hour(s)). Studies/Results: Dg Chest 2 View  02/15/2014   CLINICAL DATA:  Chest pain beginning 6 weeks ago with weakness. Fainting spells beginning several months ago.  EXAM: CHEST  2 VIEW  COMPARISON:  01/08/2014  FINDINGS: The lungs are adequately inflated No evidence of effusion or pneumothorax. There is subtle increased density in the left retrocardiac region. Cardiomediastinal  silhouette and remainder of the exam is unchanged.  IMPRESSION: Subtle increased density in the left retrocardiac region which may be due to vascular crowding although cannot exclude atelectasis or early infection.   Electronically Signed   By: Marin Olp M.D.   On: 02/15/2014 09:57   Ct Head Wo Contrast  02/15/2014   CLINICAL DATA:  Recent episodes of dizziness, vertigo and gait instability.  EXAM: CT HEAD WITHOUT CONTRAST  TECHNIQUE: Contiguous axial images were obtained from the base of the skull through the vertex without intravenous contrast.  COMPARISON:  None.  FINDINGS: Moderately advanced small vessel ischemic changes are identified in the periventricular white matter. There are old lacunar infarcts involving the right caudate nucleus, right centrum semiovale and white matter near the posterior vertex. Mild cortical atrophy present. The brain demonstrates no evidence of hemorrhage, acute infarction, edema, mass effect, extra-axial fluid collection, hydrocephalus or mass lesion. The skull is unremarkable.  IMPRESSION: Advanced small vessel disease with old lacunar infarcts, as above. There are no acute findings identified by head CT.   Electronically Signed   By: Aletta Edouard M.D.   On: 02/15/2014 09:27   Medications: I have reviewed the patient's current medications. Scheduled Meds: . aspirin  81 mg Oral Daily  . cephALEXin  500 mg Oral QID  . diltiazem  120 mg Oral Daily  . ferrous sulfate  325 mg Oral Q breakfast  . flecainide  225 mg Oral BID  . insulin aspart  0-5 Units Subcutaneous QHS  . insulin aspart  0-9 Units Subcutaneous TID WC  . insulin glargine  6 Units Subcutaneous QHS  . mupirocin ointment  1 application Topical Daily  . pravastatin  40 mg Oral Daily  . sodium chloride  3 mL Intravenous Q12H  . Warfarin - Pharmacist Dosing Inpatient   Does not apply q1800   Continuous Infusions:  PRN Meds:.nitroGLYCERIN, traMADol Assessment/Plan: Active Problems:   Near  syncope   Pre-syncope  73 yo with Afib, diastolic CHF here with near syncope.  Pre-syncope - likely 2/2 to labile BP and gait instability. BP has been variable in ED 60-150's but now stable around 100. orthostats negative.  - continue thearpy with home health PT. He has some mild vestibular dysfunction per PT eval, which would benefit from PT - cards reduced doze of cardizem to 120 (from 240). Also on metoprolol and flecanide for Afib. Held metoprolol because of BP and HR is normal. Held dilt once yesterday because of low BP but got it today at reduced 120mg  dose. - will d/c home with dilt 120 mg daily. Hold metoprolol. Continue flecanide. - follow up with Dr. [name].  Parox Afib - now in NSR. INR therapetuic.   -cont flecanide. Cont dilt 120mg  daily (was on 240 before). Hold metoprolol since BP low and Hr is controlled. This might be his home dose going forward - follow up cards outpatient Dr. [name].  Left sided chest pain - now  resolved. Likely MSK, he had a fall few months ago. - trops neg, ekg unchanged.   DM II - hgba1c 7.0. Takes lantus 3 unit QID. Total 12 units + metformin 1000 BID - lantus 6 unit daily + ssi.  S/p carpal tunnel sx - wound is healing per our exam but wound care thinks he may have some yellow necrotic tissue on both wrists.  - Cont keflex 5 days more (QID) - tramadol for pain 50mg  q6hr prn. - consulted ortho Dr. Erlinda Hong. He saw the patient here and thinks the wound is stable. - f/up Dr. Erlinda Hong outpatient.     Dispo: Disposition is deferred at this time, awaiting improvement of current medical problems.  Anticipated discharge in approximately 1-2 day(s).   The patient does have a current PCP [name], MD) and does need an [facility] hospital follow-up appointment after discharge.  The patient does have transportation limitations that hinder transportation to clinic appointments.  .Services Needed at time of discharge: Y = Yes, Blank = No PT:   OT:   RN:    Equipment:   Other:     LOS: 2 days   Dellia Nims, MD 02/17/2014, 8:09 AM

## 2014-02-17 NOTE — Progress Notes (Signed)
Internal Medicine Attending  Date: 02/17/2014  Patient name: [name] Medical record number: [id number] Date of birth: [birth year] Age: 73 y.o. Gender: male  I saw and evaluated the patient, and discussed his care with resident on A.M rounds.  Patient is doing well with no further symptoms, and is stable for discharge home.  We discussed the discharge plans and follow-up plans in detail with patient.  I reviewed the resident's note by Dr. Genene Churn and I agree with the resident's findings and plans as documented in his note.

## 2014-02-17 NOTE — Progress Notes (Signed)
Patient Name: [name] Date of Encounter: 02/17/2014  Active Problems:   Near syncope   Pre-syncope   Length of Stay: 2  SUBJECTIVE  No complains, no dizziness.   CURRENT MEDS . aspirin  81 mg Oral Daily  . cephALEXin  500 mg Oral QID  . diltiazem  120 mg Oral Daily  . ferrous sulfate  325 mg Oral Q breakfast  . flecainide  225 mg Oral BID  . insulin aspart  0-5 Units Subcutaneous QHS  . insulin aspart  0-9 Units Subcutaneous TID WC  . insulin glargine  6 Units Subcutaneous QHS  . mupirocin ointment  1 application Topical Daily  . pravastatin  40 mg Oral Daily  . sodium chloride  3 mL Intravenous Q12H  . Warfarin - Pharmacist Dosing Inpatient   Does not apply q1800    OBJECTIVE  Filed Vitals:   02/16/14 0328 02/16/14 1333 02/16/14 2102 02/17/14 0428  BP: 101/51 115/51 102/61 99/58  Pulse: 69 75  72  Temp: 97.9 F (36.6 C) 97.4 F (36.3 C) 97.8 F (36.6 C) 97.5 F (36.4 C)  TempSrc: Oral Oral Oral Oral  Resp: 18 18 18 18   Height: 5\' 6"  (1.676 m)     Weight: 190 lb 7.6 oz (86.4 kg)     SpO2: 95% 99% 95% 95%    Intake/Output Summary (Last 24 hours) at 02/17/14 1029 Last data filed at 02/17/14 0600  Gross per 24 hour  Intake    843 ml  Output    850 ml  Net     -7 ml   Filed Weights   02/16/14 0328  Weight: 190 lb 7.6 oz (86.4 kg)    PHYSICAL EXAM  General: Pleasant, NAD. Neuro: Alert and oriented X 3. Moves all extremities spontaneously. Psych: Normal affect. HEENT:  Normal  Neck: Supple without bruits or JVD. Lungs:  Resp regular and unlabored, CTA. Heart: RRR no s3, s4, or murmurs. Abdomen: Soft, non-tender, non-distended, BS + x 4.  Extremities: No clubbing, cyanosis or edema. DP/PT/Radials 2+ and equal bilaterally.  Accessory Clinical Findings  CBC  Recent Labs  02/16/14 0319 02/17/14 0417  WBC 3.3* 4.1  HGB 11.5* 12.1*  HCT 35.0* 37.3*  MCV 88.6 89.7  PLT 161 010   Basic Metabolic Panel  Recent Labs  02/16/14 0319  02/17/14 0417  NA 139 137  K 3.8 4.0  CL 104 102  CO2 22 23  GLUCOSE 149* 183*  BUN 10 9  CREATININE 0.97 0.92  CALCIUM 8.2* 8.5   Cardiac Enzymes  Recent Labs  02/15/14 0813 02/15/14 2300 02/16/14 0319  TROPONINI <0.30 <0.30 <0.30   Radiology/Studies  Dg Chest 2 View  02/15/2014   CLINICAL DATA:  IMPRESSION: Subtle increased density in the left retrocardiac region which may be due to vascular crowding although cannot exclude atelectasis or early infection.      Ct Head Wo Contrast  02/15/2014   CLINICAL DATA:  Recent episodes of dizziness, vertigo and gait instability.   IMPRESSION: Advanced small vessel disease with old lacunar infarcts, as above. There are no acute findings identified by head CT.     TELE: SR    ASSESSMENT AND PLAN 73 y/o male with h/o paroxysmal aflutter and nonobstructive CAD who presented to the ED this AM with left sided chest pain and tenderness and dizziness. These symptoms have resolved. He has no complains today.  1. Left-sided chest and abdominal pain and dizzyness: Patient has a long history  of this exact symptom with negative stress tests in 2013 and again in April 2015 - this is reassuring. Pain is easily reproducible with palpation over his left lateral rib cage. His troponin is normal and ECG is nonacute. Given recent study in April of this year, would not pursue additional ischemic evaluation at this time.  2. Labile hypertension/hypotension: Now rather hypotensive,  Continue low doses of diltiazem.  Patient has chronic lightheadedness and unsteady gait. His bradycardia has resolved with reduced dose of diltiazem as well.   3. Paroxysmal atrial fibrillation and flutter: INR is therapeutic and he is in sinus rhythm. As above, with hypotension, recommend reducing diltiazem to 120 mg daily.   4. Tob Abuse: Cessation advised.  5. DM: Followed by PCP as outpt. Pt seems to have a good handle on it.  6. HL: Cont statin.  He can be  discharged from cardiology standpoint, we will arrange for a follow up appointment with Dr [name]. We will sign off.   Signed, Dorothy Spark MD, [facility] 02/17/2014

## 2014-02-17 NOTE — Progress Notes (Signed)
Wounds are stable. May dc from ortho standpoint Patient already has f/u appt with me.  Azucena Cecil, MD [place] 1:04 PM

## 2014-02-17 NOTE — Progress Notes (Signed)
MD removed dsg to both pt hands to examine site. New dsgs applied to incisions on both hands. Will continue to monitor quietly. Delia Heady RN

## 2014-02-17 NOTE — Care Management Note (Signed)
    Page 1 of 1   02/17/2014     12:04:03 PM CARE MANAGEMENT NOTE 02/17/2014  Patient:  [name], [name]   Account Number:  [id number]  Date Initiated:  02/17/2014  Documentation initiated by:  Marvetta Gibbons  Subjective/Objective Assessment:   Pt admitted with near snycope     Action/Plan:   PTA pt lived at home with daughter- PT eval   Anticipated DC Date:  02/17/2014   Anticipated DC Plan:  [place]  CM consult      [address] [facility] Choice  HOME HEALTH   Choice offered to / List presented to:  C-1 Patient        [place] arranged  HH-2 PT      [place].   Status of service:  Completed, signed off Medicare Important Message given?  NO (If response is "NO", the following Medicare IM given date fields will be blank) Date Medicare IM given:   Medicare IM given by:   Date Additional Medicare IM given:   Additional Medicare IM given by:    Discharge Disposition:  [place]  Per UR Regulation:  Reviewed for med. necessity/level of care/duration of stay  If discussed at [place] of Stay Meetings, dates discussed:    Comments:  02/17/14- 1030- Marvetta Gibbons RN, BSN [phone] Pt for d/c home today- order for HH-PT- spoke with pt at bedside- list of [facility] agencies for [facility] reviewed with pt- per pt choice would like to use [facility] for services- referral called to [facility] with [facility] for HH-PT.

## 2014-02-17 NOTE — Progress Notes (Signed)
Pt A&O x4; pt discharge education and instructions completed with pt and daughter at bedside. Both voices understanding and denies any questions. Pt IV and telemetry removed; pt discharge home with daughter to transport him home. Pt transported off unit via wheelchair with belongings and daughter at side. Francis Gaines Nefertiti Mohamad RN.

## 2014-02-17 NOTE — Plan of Care (Signed)
Problem: Phase II Progression Outcomes Goal: Obtain order to discontinue catheter if appropriate Outcome: Not Applicable Date Met:  02/17/14     

## 2014-02-17 NOTE — Discharge Summary (Signed)
Name: [name] MRN: [id number] DOB: 1940/07/07 73 y.o. PCP: [name], MD  Date of Admission: 02/15/2014  7:33 AM Date of Discharge: 02/17/2014 Attending Physician: Axel Filler, MD  Discharge Diagnosis:  Active Problems:   Near syncope   Pre-syncope  Discharge Medications:   Medication List    STOP taking these medications        metoprolol succinate 50 MG 24 hr tablet  Commonly known as:  TOPROL-XL      TAKE these medications        aspirin 81 MG chewable tablet  Chew 81 mg by mouth daily.     cephALEXin 500 MG capsule  Commonly known as:  KEFLEX  Take 500 mg by mouth 4 (four) times daily.     diltiazem 120 MG 24 hr capsule  Commonly known as:  CARDIZEM CD  Take 1 capsule (120 mg total) by mouth daily.     ferrous sulfate 325 (65 FE) MG tablet  Take 325 mg by mouth daily with breakfast.     flecainide 150 MG tablet  Commonly known as:  TAMBOCOR  Take 225 mg by mouth 2 (two) times daily.     HYDROcodone-acetaminophen 5-325 MG per tablet  Commonly known as:  NORCO/VICODIN  Take 1 tablet by mouth every 6 (six) hours as needed for moderate pain.     hydrocortisone cream 1 %  Apply 1 application topically 2 (two) times daily as needed for itching.     LANTUS SOLOSTAR 100 UNIT/ML injection  Generic drug:  insulin glargine  Inject 3 Units into the skin 4 (four) times daily.     loratadine 10 MG tablet  Commonly known as:  CLARITIN  Take 10 mg by mouth every morning.     metFORMIN 1000 MG tablet  Commonly known as:  GLUCOPHAGE  Take 1,000 mg by mouth 2 (two) times daily with a meal.     mupirocin ointment 2 %  Commonly known as:  BACTROBAN  Apply 1 application topically daily.     nitroGLYCERIN 0.4 MG SL tablet  Commonly known as:  NITROSTAT  Place 0.4 mg under the tongue every 5 (five) minutes x 3 doses as needed for chest pain.     pravastatin 40 MG tablet  Commonly known as:  PRAVACHOL  Take 40 mg by mouth daily.     traMADol  50 MG tablet  Commonly known as:  ULTRAM  Take 50 mg by mouth every 6 (six) hours as needed (pain).     warfarin 5 MG tablet  Commonly known as:  COUMADIN  Take 5-7.5 mg by mouth daily. Take 5mg  on Tuesday, Thursday, Saturday and Sunday.  Take 7.5mg  on Monday, Wednesday, and Friday        Disposition and follow-up:   Mr.[name] was discharged from [facility] in Stable condition.  At the hospital follow up visit please address:  1.  Please check his INR. He was sub therapeutic here Please monitor his BP. His dilt was reduced and metoprolol was held as BP low and HR is controlled now. This was the likely cause of his pre-syncope. He will get PT at home for his unsteady gait.   2.  Labs / imaging needed at time of follow-up: INR  3.  Pending labs/ test needing follow-up:   Follow-up Appointments: Follow-up Information    Follow up with Leonie Man, MD.   Specialty:  Cardiology   Why:  The office will call.  Contact information:   [address] [place] [address] [phone]       Follow up with [facility].   Why:  HH-PT arranged   Contact information:   [address] [place]  [address] [phone]       Follow up with [name], MD. Schedule an appointment as soon as possible for a visit on 02/25/2014.   Specialty:  Family Medicine   Why:  @ 1 pm.   Contact information:   [address] [place] [address] [phone]       Follow up with [name], MD On 02/23/2014.   Specialty:  Family Medicine   Why:  Please go to the office on Monday for INR check. you can walk in.   Contact information:   [address] [place] [address] [phone]       Discharge Instructions:   Consultations:    Procedures Performed:  Dg Chest 2 View  02/15/2014   CLINICAL DATA:  Chest pain beginning 6 weeks ago with weakness. Fainting spells beginning several months  ago.  EXAM: CHEST  2 VIEW  COMPARISON:  01/08/2014  FINDINGS: The lungs are adequately inflated No evidence of effusion or pneumothorax. There is subtle increased density in the left retrocardiac region. Cardiomediastinal silhouette and remainder of the exam is unchanged.  IMPRESSION: Subtle increased density in the left retrocardiac region which may be due to vascular crowding although cannot exclude atelectasis or early infection.   Electronically Signed   By: Marin Olp M.D.   On: 02/15/2014 09:57   Ct Head Wo Contrast  02/15/2014   CLINICAL DATA:  Recent episodes of dizziness, vertigo and gait instability.  EXAM: CT HEAD WITHOUT CONTRAST  TECHNIQUE: Contiguous axial images were obtained from the base of the skull through the vertex without intravenous contrast.  COMPARISON:  None.  FINDINGS: Moderately advanced small vessel ischemic changes are identified in the periventricular white matter. There are old lacunar infarcts involving the right caudate nucleus, right centrum semiovale and white matter near the posterior vertex. Mild cortical atrophy present. The brain demonstrates no evidence of hemorrhage, acute infarction, edema, mass effect, extra-axial fluid collection, hydrocephalus or mass lesion. The skull is unremarkable.  IMPRESSION: Advanced small vessel disease with old lacunar infarcts, as above. There are no acute findings identified by head CT.   Electronically Signed   By: Aletta Edouard M.D.   On: 02/15/2014 09:27    2D Echo:   Cardiac Cath:   Admission HPI:   73 yo male with hx of aflutter (on coumadin), diastolic CHF (YS06-30%), COPD, HTN, DM II here with feeling weak, dizzy, unsteady, and stumbling for last several months which is progressively worsening. He feels dizzy but no vertigo, no hearing deficit or visual changes, headache, focal weakness, numbness, tingling. When he gets up he feels dizzy and feels like he will pass out or fall if he doesn't hold on to something. He is  unsteady. He is on metoprolol, cardizem, and flecanide for his Afib. Had ablation done for Afib. His Metop was decreased recently bc of his HR being low in 50-60's. He takes lantus and metformin for his DM II. No falls recently just feels unsteady. He feels somewhat dehydrated today.   He also has chronic left sided pain under his rib which as been going on for months. No change with breathing or position changes. It's mild/dull type of pain. No rashes. Had a fall 6-8 months  ago on that side but no fractures. Has carpel tunnel surgery 01/30/14, was put on keflex for 6 days QID.    BP has been labile SBP 60-130's. HR also labile. orthostats negative. CT head old lacunar infarct, no acute changes.. CXR vascular crowding. EKG shows NSR now.   Cards consulted, EKG NSR.   Hospital Course by problem list: Active Problems:   Near syncope   Pre-syncope   73 yo with Afib, diastolic CHF here with near syncope.  Pre-syncope - likely 2/2 to labile BP and gait instability. BP has been variable in ED 60-150's but now stable around 100. orthostats negative.  - continue thearpy with home health PT. He has some mild vestibular dysfunction per PT eval, which would benefit from PT - cards reduced doze of cardizem to 120 (from 240). Also on metoprolol and flecanide for Afib. Held metoprolol because of BP and HR is normal. Held dilt once yesterday because of low BP but got it today at reduced 120mg  dose. - will d/c home with dilt 120 mg daily. Hold metoprolol. Continue flecanide. - follow up with Dr. [name] cards.   Parox Afib - now in NSR and normal rate. INR therapetuic.  -cont flecanide. Cont dilt 120mg  daily (was on 240 before). Hold metoprolol since BP low and Hr is controlled. This might be his home dose going forward - follow up cards outpatient Dr. [name]. - [facility] 01/23/14. Take 10 mg coumadin today, 7.5 tomorrow, then back to home regimen.  Left sided chest pain - now  resolved. Likely MSK, he had a fall few months ago. - trops neg, ekg unchanged. Resolved.   DM II - hgba1c 7.0. Takes lantus 3 unit QID. Total 12 units + metformin 1000 BID - lantus 6 unit daily + ssi.  S/p carpal tunnel sx - wound is healing per our exam but wound care thinks he may have some yellow necrotic tissue on both wrists.  - Cont keflex 5 days more (QID) - tramadol for pain 50mg  q6hr prn. - consulted ortho Dr. Erlinda Hong. He saw the patient here and thinks the wound is stable. - f/up Dr. Erlinda Hong outpatient. Has appt already per Dr. Phoebe Sharps note.   Discharge Vitals:   BP 116/63 mmHg  Pulse 90  Temp(Src) 97.9 F (36.6 C) (Oral)  Resp 17  Ht 5\' 6"  (1.676 m)  Wt 86.4 kg (190 lb 7.6 oz)  BMI 30.76 kg/m2  SpO2 96%  Discharge Labs:  Results for orders placed or performed during the hospital encounter of 02/15/14 (from the past 24 hour(s))  Glucose, capillary     Status: Abnormal   Collection Time: 02/16/14  4:10 PM  Result Value Ref Range   Glucose-Capillary 159 (H) 70 - 99 mg/dL   Comment 1 Documented in Chart    Comment 2 Notify RN   Glucose, capillary     Status: Abnormal   Collection Time: 02/16/14  8:58 PM  Result Value Ref Range   Glucose-Capillary 204 (H) 70 - 99 mg/dL  Glucose, capillary     Status: Abnormal   Collection Time: 02/16/14  9:19 PM  Result Value Ref Range   Glucose-Capillary 207 (H) 70 - 99 mg/dL  Protime-INR     Status: Abnormal   Collection Time: 02/17/14  4:17 AM  Result Value Ref Range   Prothrombin Time 18.3 (H) 11.6 - 15.2 seconds   INR 1.51 (H) 0.00 - 5.10  Basic metabolic panel     Status: Abnormal  Collection Time: 02/17/14  4:17 AM  Result Value Ref Range   Sodium 137 137 - 147 mEq/L   Potassium 4.0 3.7 - 5.3 mEq/L   Chloride 102 96 - 112 mEq/L   CO2 23 19 - 32 mEq/L   Glucose, Bld 183 (H) 70 - 99 mg/dL   BUN 9 6 - 23 mg/dL   Creatinine, Ser 0.92 0.50 - 1.35 mg/dL   Calcium 8.5 8.4 - 10.5 mg/dL   GFR calc non Af Amer 81 (L) >90 mL/min   GFR  calc Af Amer >90 >90 mL/min   Anion gap 12 5 - 15  CBC     Status: Abnormal   Collection Time: 02/17/14  4:17 AM  Result Value Ref Range   WBC 4.1 4.0 - 10.5 K/uL   RBC 4.16 (L) 4.22 - 5.81 MIL/uL   Hemoglobin 12.1 (L) 13.0 - 17.0 g/dL   HCT 37.3 (L) 39.0 - 52.0 %   MCV 89.7 78.0 - 100.0 fL   MCH 29.1 26.0 - 34.0 pg   MCHC 32.4 30.0 - 36.0 g/dL   RDW 13.9 11.5 - 15.5 %   Platelets 168 150 - 400 K/uL  Glucose, capillary     Status: Abnormal   Collection Time: 02/17/14  6:45 AM  Result Value Ref Range   Glucose-Capillary 164 (H) 70 - 99 mg/dL  Glucose, capillary     Status: Abnormal   Collection Time: 02/17/14 11:21 AM  Result Value Ref Range   Glucose-Capillary 149 (H) 70 - 99 mg/dL   Comment 1 Notify RN     Signed: Dellia Nims, MD 02/17/2014, 2:10 PM    Services Ordered on Discharge:  Equipment Ordered on Discharge:

## 2014-02-17 NOTE — Plan of Care (Signed)
Problem: Phase I Progression Outcomes Goal: Voiding-avoid urinary catheter unless indicated Outcome: Completed/Met Date Met:  02/17/14

## 2014-02-18 ENCOUNTER — Telehealth: Payer: Self-pay | Admitting: Cardiology

## 2014-02-18 ENCOUNTER — Encounter (HOSPITAL_COMMUNITY): Payer: Self-pay | Admitting: *Deleted

## 2014-02-18 ENCOUNTER — Emergency Department (HOSPITAL_COMMUNITY)
Admission: EM | Admit: 2014-02-18 | Discharge: 2014-02-18 | Disposition: A | Discharge status: Home / Self Care | Payer: Medicare Other | Attending: Emergency Medicine | Admitting: Emergency Medicine

## 2014-02-18 DIAGNOSIS — Z8719 Personal history of other diseases of the digestive system: Secondary | ICD-10-CM | POA: Diagnosis not present

## 2014-02-18 DIAGNOSIS — Z8739 Personal history of other diseases of the musculoskeletal system and connective tissue: Secondary | ICD-10-CM | POA: Insufficient documentation

## 2014-02-18 DIAGNOSIS — D649 Anemia, unspecified: Secondary | ICD-10-CM | POA: Diagnosis not present

## 2014-02-18 DIAGNOSIS — I5032 Chronic diastolic (congestive) heart failure: Secondary | ICD-10-CM | POA: Insufficient documentation

## 2014-02-18 DIAGNOSIS — Z720 Tobacco use: Secondary | ICD-10-CM | POA: Diagnosis not present

## 2014-02-18 DIAGNOSIS — Z0130 Encounter for examination of blood pressure without abnormal findings: Secondary | ICD-10-CM | POA: Diagnosis not present

## 2014-02-18 DIAGNOSIS — M1990 Unspecified osteoarthritis, unspecified site: Secondary | ICD-10-CM | POA: Diagnosis not present

## 2014-02-18 DIAGNOSIS — Z7982 Long term (current) use of aspirin: Secondary | ICD-10-CM | POA: Insufficient documentation

## 2014-02-18 DIAGNOSIS — Z792 Long term (current) use of antibiotics: Secondary | ICD-10-CM | POA: Diagnosis not present

## 2014-02-18 DIAGNOSIS — Z8701 Personal history of pneumonia (recurrent): Secondary | ICD-10-CM | POA: Insufficient documentation

## 2014-02-18 DIAGNOSIS — I2510 Atherosclerotic heart disease of native coronary artery without angina pectoris: Secondary | ICD-10-CM | POA: Diagnosis not present

## 2014-02-18 DIAGNOSIS — E119 Type 2 diabetes mellitus without complications: Secondary | ICD-10-CM | POA: Insufficient documentation

## 2014-02-18 DIAGNOSIS — I10 Essential (primary) hypertension: Secondary | ICD-10-CM | POA: Diagnosis present

## 2014-02-18 DIAGNOSIS — J449 Chronic obstructive pulmonary disease, unspecified: Secondary | ICD-10-CM | POA: Insufficient documentation

## 2014-02-18 DIAGNOSIS — I4892 Unspecified atrial flutter: Secondary | ICD-10-CM | POA: Insufficient documentation

## 2014-02-18 DIAGNOSIS — Z9889 Other specified postprocedural states: Secondary | ICD-10-CM | POA: Insufficient documentation

## 2014-02-18 DIAGNOSIS — Z79899 Other long term (current) drug therapy: Secondary | ICD-10-CM | POA: Insufficient documentation

## 2014-02-18 LAB — I-STAT CHEM 8, ED
BUN: 4 mg/dL — ABNORMAL LOW (ref 6–23)
Calcium, Ion: 1.12 mmol/L — ABNORMAL LOW (ref 1.13–1.30)
Chloride: 103 mEq/L (ref 96–112)
Creatinine, Ser: 0.8 mg/dL (ref 0.50–1.35)
Glucose, Bld: 141 mg/dL — ABNORMAL HIGH (ref 70–99)
HEMATOCRIT: 36 % — AB (ref 39.0–52.0)
HEMOGLOBIN: 12.2 g/dL — AB (ref 13.0–17.0)
POTASSIUM: 3.8 meq/L (ref 3.7–5.3)
SODIUM: 139 meq/L (ref 137–147)
TCO2: 23 mmol/L (ref 0–100)

## 2014-02-18 LAB — CBC WITH DIFFERENTIAL/PLATELET
BASOS ABS: 0.1 10*3/uL (ref 0.0–0.1)
Basophils Relative: 1 % (ref 0–1)
Eosinophils Absolute: 0.4 10*3/uL (ref 0.0–0.7)
Eosinophils Relative: 7 % — ABNORMAL HIGH (ref 0–5)
HCT: 38.8 % — ABNORMAL LOW (ref 39.0–52.0)
HEMOGLOBIN: 13.1 g/dL (ref 13.0–17.0)
Lymphocytes Relative: 15 % (ref 12–46)
Lymphs Abs: 0.9 10*3/uL (ref 0.7–4.0)
MCH: 29 pg (ref 26.0–34.0)
MCHC: 33.8 g/dL (ref 30.0–36.0)
MCV: 86 fL (ref 78.0–100.0)
MONOS PCT: 14 % — AB (ref 3–12)
Monocytes Absolute: 0.8 10*3/uL (ref 0.1–1.0)
NEUTROS PCT: 63 % (ref 43–77)
Neutro Abs: 3.5 10*3/uL (ref 1.7–7.7)
Platelets: 219 10*3/uL (ref 150–400)
RBC: 4.51 MIL/uL (ref 4.22–5.81)
RDW: 13.7 % (ref 11.5–15.5)
WBC: 5.5 10*3/uL (ref 4.0–10.5)

## 2014-02-18 LAB — PROTIME-INR
INR: 2.01 — ABNORMAL HIGH (ref 0.00–1.49)
Prothrombin Time: 23 seconds — ABNORMAL HIGH (ref 11.6–15.2)

## 2014-02-18 LAB — BASIC METABOLIC PANEL
Anion gap: 17 — ABNORMAL HIGH (ref 5–15)
BUN: 7 mg/dL (ref 6–23)
CO2: 21 meq/L (ref 19–32)
Calcium: 9.2 mg/dL (ref 8.4–10.5)
Chloride: 99 mEq/L (ref 96–112)
Creatinine, Ser: 0.82 mg/dL (ref 0.50–1.35)
GFR calc Af Amer: >90 mL/min (ref >90)
GFR, EST NON AFRICAN AMERICAN: 85 mL/min — AB (ref >90)
Glucose, Bld: 166 mg/dL — ABNORMAL HIGH (ref 70–99)
POTASSIUM: 3.9 meq/L (ref 3.7–5.3)
Sodium: 137 mEq/L (ref 137–147)

## 2014-02-18 LAB — TROPONIN I: Troponin I: <0.3 ng/mL (ref <0.30)

## 2014-02-18 MED ORDER — SODIUM CHLORIDE 0.9 % IV BOLUS (SEPSIS)
1000.0000 mL | Freq: Once | INTRAVENOUS | Status: AC
  Administered 2014-02-18: 1000 mL via INTRAVENOUS

## 2014-02-18 NOTE — Telephone Encounter (Signed)
Reuben Likes was calling in to get a home health order for the pt. Please call  Thanks
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
## 2014-02-18 NOTE — ED Notes (Signed)
MD Mesner at bedside  °

## 2014-02-18 NOTE — Telephone Encounter (Signed)
Spoke to [place] RN ,PATIENT'S BLOOD PRESSURE AROUND 140'S/90 REQUEST A HOME HEALTH NURSE TO FOLLOW /EVALUATE BLOOD PRESSURE AND ALSO TO DRAW PROTIME AND SEND RESULTS- schedule for 02/23/14 OKAY, FOR RN TO EVALUATE WILL INFORM DR harding

## 2014-02-18 NOTE — ED Notes (Signed)
Pt reports just being dc from hospital yesterday, was admitted for htn. Pt reports having changes made to bp meds and now having htn again. bp is 136/73 at triage. No acute distress noted.

## 2014-02-18 NOTE — ED Notes (Signed)
Pt. Ambulated in hall with no problems

## 2014-02-18 NOTE — Telephone Encounter (Signed)
That is fine with me to see if [facility] will do that. BP in 140/90 is OK.  Leonie Man, MD

## 2014-02-23 ENCOUNTER — Ambulatory Visit: Payer: Medicare Other | Admitting: Pharmacist Clinician (PhC)/ Clinical Pharmacy Specialist

## 2014-02-23 ENCOUNTER — Ambulatory Visit: Payer: Medicare Other | Admitting: Cardiology

## 2014-02-23 ENCOUNTER — Ambulatory Visit (INDEPENDENT_AMBULATORY_CARE_PROVIDER_SITE_OTHER): Payer: Medicare Other | Admitting: Pharmacist Clinician (PhC)/ Clinical Pharmacy Specialist

## 2014-02-23 LAB — POCT INR: INR: 2.8

## 2014-02-27 ENCOUNTER — Telehealth: Payer: Self-pay | Admitting: *Deleted

## 2014-02-27 NOTE — Telephone Encounter (Signed)
Faxed and signed  [phone]-- [facility]

## 2014-03-06 ENCOUNTER — Ambulatory Visit (INDEPENDENT_AMBULATORY_CARE_PROVIDER_SITE_OTHER): Payer: Medicare Other | Admitting: Pharmacist Clinician (PhC)/ Clinical Pharmacy Specialist

## 2014-03-06 DIAGNOSIS — I480 Paroxysmal atrial fibrillation: Secondary | ICD-10-CM

## 2014-03-06 DIAGNOSIS — Z7901 Long term (current) use of anticoagulants: Secondary | ICD-10-CM

## 2014-03-06 LAB — POCT INR: INR: 2.4

## 2014-03-09 ENCOUNTER — Ambulatory Visit: Payer: Medicare Other | Admitting: Cardiology

## 2014-03-11 ENCOUNTER — Telehealth: Payer: Self-pay | Admitting: Cardiology

## 2014-03-11 MED ORDER — DILTIAZEM HCL ER COATED BEADS 120 MG PO CP24: 120.0000 mg | ORAL_CAPSULE | Freq: Every day | ORAL | Status: DC

## 2014-03-11 NOTE — Telephone Encounter (Signed)
Pt need another refill on his Cartia XT 120 mg #30,they changed his dosage in the hospital.. Please call to [phone].

## 2014-03-11 NOTE — Telephone Encounter (Signed)
Refills sent to pharmacy. 

## 2014-03-21 ENCOUNTER — Emergency Department (HOSPITAL_COMMUNITY): Payer: Medicare Other

## 2014-03-21 ENCOUNTER — Emergency Department (HOSPITAL_COMMUNITY)
Admission: EM | Admit: 2014-03-21 | Discharge: 2014-03-21 | Disposition: A | Discharge status: Home / Self Care | Payer: Medicare Other | Attending: Emergency Medicine | Admitting: Emergency Medicine

## 2014-03-21 ENCOUNTER — Encounter (HOSPITAL_COMMUNITY): Payer: Self-pay | Admitting: Emergency Medicine

## 2014-03-21 DIAGNOSIS — J189 Pneumonia, unspecified organism: Secondary | ICD-10-CM

## 2014-03-21 DIAGNOSIS — J441 Chronic obstructive pulmonary disease with (acute) exacerbation: Secondary | ICD-10-CM | POA: Insufficient documentation

## 2014-03-21 DIAGNOSIS — M1990 Unspecified osteoarthritis, unspecified site: Secondary | ICD-10-CM | POA: Diagnosis not present

## 2014-03-21 DIAGNOSIS — Z8701 Personal history of pneumonia (recurrent): Secondary | ICD-10-CM | POA: Insufficient documentation

## 2014-03-21 DIAGNOSIS — Z792 Long term (current) use of antibiotics: Secondary | ICD-10-CM | POA: Diagnosis not present

## 2014-03-21 DIAGNOSIS — I2510 Atherosclerotic heart disease of native coronary artery without angina pectoris: Secondary | ICD-10-CM | POA: Insufficient documentation

## 2014-03-21 DIAGNOSIS — E119 Type 2 diabetes mellitus without complications: Secondary | ICD-10-CM | POA: Diagnosis not present

## 2014-03-21 DIAGNOSIS — I4892 Unspecified atrial flutter: Secondary | ICD-10-CM | POA: Diagnosis not present

## 2014-03-21 DIAGNOSIS — Z720 Tobacco use: Secondary | ICD-10-CM | POA: Insufficient documentation

## 2014-03-21 DIAGNOSIS — E785 Hyperlipidemia, unspecified: Secondary | ICD-10-CM | POA: Diagnosis not present

## 2014-03-21 DIAGNOSIS — I480 Paroxysmal atrial fibrillation: Secondary | ICD-10-CM | POA: Diagnosis not present

## 2014-03-21 DIAGNOSIS — I10 Essential (primary) hypertension: Secondary | ICD-10-CM | POA: Diagnosis not present

## 2014-03-21 DIAGNOSIS — Z9889 Other specified postprocedural states: Secondary | ICD-10-CM | POA: Insufficient documentation

## 2014-03-21 DIAGNOSIS — Z794 Long term (current) use of insulin: Secondary | ICD-10-CM | POA: Insufficient documentation

## 2014-03-21 DIAGNOSIS — Z8719 Personal history of other diseases of the digestive system: Secondary | ICD-10-CM | POA: Insufficient documentation

## 2014-03-21 DIAGNOSIS — Z7982 Long term (current) use of aspirin: Secondary | ICD-10-CM | POA: Diagnosis not present

## 2014-03-21 DIAGNOSIS — R0602 Shortness of breath: Secondary | ICD-10-CM

## 2014-03-21 DIAGNOSIS — D649 Anemia, unspecified: Secondary | ICD-10-CM | POA: Insufficient documentation

## 2014-03-21 DIAGNOSIS — J159 Unspecified bacterial pneumonia: Secondary | ICD-10-CM | POA: Diagnosis not present

## 2014-03-21 DIAGNOSIS — Z7901 Long term (current) use of anticoagulants: Secondary | ICD-10-CM | POA: Insufficient documentation

## 2014-03-21 DIAGNOSIS — Z79899 Other long term (current) drug therapy: Secondary | ICD-10-CM | POA: Insufficient documentation

## 2014-03-21 DIAGNOSIS — I5032 Chronic diastolic (congestive) heart failure: Secondary | ICD-10-CM | POA: Insufficient documentation

## 2014-03-21 LAB — CBC
HCT: 41 % (ref 39.0–52.0)
Hemoglobin: 13.2 g/dL (ref 13.0–17.0)
MCH: 27.7 pg (ref 26.0–34.0)
MCHC: 32.2 g/dL (ref 30.0–36.0)
MCV: 86.1 fL (ref 78.0–100.0)
PLATELETS: 259 10*3/uL (ref 150–400)
RBC: 4.76 MIL/uL (ref 4.22–5.81)
RDW: 13.7 % (ref 11.5–15.5)
WBC: 6.8 10*3/uL (ref 4.0–10.5)

## 2014-03-21 LAB — BASIC METABOLIC PANEL
ANION GAP: 9 (ref 5–15)
BUN: 10 mg/dL (ref 6–23)
CHLORIDE: 102 meq/L (ref 96–112)
CO2: 27 mmol/L (ref 19–32)
Calcium: 9.2 mg/dL (ref 8.4–10.5)
Creatinine, Ser: 0.97 mg/dL (ref 0.50–1.35)
GFR calc non Af Amer: 79 mL/min — ABNORMAL LOW (ref >90)
Glucose, Bld: 158 mg/dL — ABNORMAL HIGH (ref 70–99)
Potassium: 4.1 mmol/L (ref 3.5–5.1)
Sodium: 138 mmol/L (ref 135–145)

## 2014-03-21 LAB — I-STAT TROPONIN, ED: Troponin i, poc: 0 ng/mL (ref 0.00–0.08)

## 2014-03-21 LAB — BRAIN NATRIURETIC PEPTIDE: B NATRIURETIC PEPTIDE 5: 40.1 pg/mL (ref 0.0–100.0)

## 2014-03-21 MED ORDER — PREDNISONE 20 MG PO TABS
40.0000 mg | ORAL_TABLET | Freq: Once | ORAL | Status: AC
  Administered 2014-03-21: 40 mg via ORAL
  Filled 2014-03-21: qty 2

## 2014-03-21 MED ORDER — PREDNISONE 20 MG PO TABS: 60.0000 mg | ORAL_TABLET | Freq: Once | ORAL | Status: DC

## 2014-03-21 MED ORDER — IPRATROPIUM-ALBUTEROL 0.5-2.5 (3) MG/3ML IN SOLN
3.0000 mL | Freq: Once | RESPIRATORY_TRACT | Status: AC
  Administered 2014-03-21: 3 mL via RESPIRATORY_TRACT
  Filled 2014-03-21: qty 3

## 2014-03-21 MED ORDER — ALBUTEROL SULFATE HFA 108 (90 BASE) MCG/ACT IN AERS: 2.0000 | INHALATION_SPRAY | RESPIRATORY_TRACT | Status: DC | PRN

## 2014-03-21 MED ORDER — AMOXICILLIN 500 MG PO CAPS: 1000.0000 mg | ORAL_CAPSULE | Freq: Two times a day (BID) | ORAL | Status: DC

## 2014-03-21 MED ORDER — PREDNISONE 20 MG PO TABS: 40.0000 mg | ORAL_TABLET | Freq: Every day | ORAL | Status: DC

## 2014-03-21 MED ORDER — AMOXICILLIN 500 MG PO CAPS
1000.0000 mg | ORAL_CAPSULE | Freq: Once | ORAL | Status: AC
  Administered 2014-03-21: 1000 mg via ORAL
  Filled 2014-03-21: qty 2

## 2014-03-21 NOTE — ED Notes (Signed)
PT ambulated with baseline gait; VSS; A&Ox3; no signs of distress; respirations even and unlabored; skin warm and dry; no questions upon discharge.  

## 2014-03-21 NOTE — Discharge Instructions (Signed)
Pneumonia Pneumonia is an infection of the lungs.  CAUSES Pneumonia may be caused by bacteria or a virus. Usually, these infections are caused by breathing infectious particles into the lungs (respiratory tract). SIGNS AND SYMPTOMS   Cough.  Fever.  Chest pain.  Increased rate of breathing.  Wheezing.  Mucus production. DIAGNOSIS  If you have the common symptoms of pneumonia, your health care provider will typically confirm the diagnosis with a chest X-ray. The X-ray will show an abnormality in the lung (pulmonary infiltrate) if you have pneumonia. Other tests of your blood, urine, or sputum may be done to find the specific cause of your pneumonia. Your health care provider may also do tests (blood gases or pulse oximetry) to see how well your lungs are working. TREATMENT  Some forms of pneumonia may be spread to other people when you cough or sneeze. You may be asked to wear a mask before and during your exam. Pneumonia that is caused by bacteria is treated with antibiotic medicine. Pneumonia that is caused by the influenza virus may be treated with an antiviral medicine. Most other viral infections must run their course. These infections will not respond to antibiotics.  HOME CARE INSTRUCTIONS   Cough suppressants may be used if you are losing too much rest. However, coughing protects you by clearing your lungs. You should avoid using cough suppressants if you can.  Your health care provider may have prescribed medicine if he or she thinks your pneumonia is caused by bacteria or influenza. Finish your medicine even if you start to feel better.  Your health care provider may also prescribe an expectorant. This loosens the mucus to be coughed up.  Take medicines only as directed by your health care provider.  Do not smoke. Smoking is a common cause of bronchitis and can contribute to pneumonia. If you are a smoker and continue to smoke, your cough may last several weeks after your  pneumonia has cleared.  A cold steam vaporizer or humidifier in your room or home may help loosen mucus.  Coughing is often worse at night. Sleeping in a semi-upright position in a recliner or using a couple pillows under your head will help with this.  Get rest as you feel it is needed. Your body will usually let you know when you need to rest. PREVENTION A pneumococcal shot (vaccine) is available to prevent a common bacterial cause of pneumonia. This is usually suggested for:  People over 65 years old.  Patients on chemotherapy.  People with chronic lung problems, such as bronchitis or emphysema.  People with immune system problems. If you are over 65 or have a high risk condition, you may receive the pneumococcal vaccine if you have not received it before. In some countries, a routine influenza vaccine is also recommended. This vaccine can help prevent some cases of pneumonia.You may be offered the influenza vaccine as part of your care. If you smoke, it is time to quit. You may receive instructions on how to stop smoking. Your health care provider can provide medicines and counseling to help you quit. SEEK MEDICAL CARE IF: You have a fever. SEEK IMMEDIATE MEDICAL CARE IF:   Your illness becomes worse. This is especially true if you are elderly or weakened from any other disease.  You cannot control your cough with suppressants and are losing sleep.  You begin coughing up blood.  You develop pain which is getting worse or is uncontrolled with medicines.  Any of the symptoms   which initially brought you in for treatment are getting worse rather than better.  You develop shortness of breath or chest pain. MAKE SURE YOU:   Understand these instructions.  Will watch your condition.  Will get help right away if you are not doing well or get worse. Document Released: 03/13/2005 Document Revised: 07/28/2013 Document Reviewed: 06/02/2010 [facility] Patient Information 2015  [place], [place]. This information is not intended to replace advice given to you by your health care provider. Make sure you discuss any questions you have with your health care provider.  Amoxicillin capsules or tablets What is this medicine? AMOXICILLIN (a mox i SIL in) is a penicillin antibiotic. It is used to treat certain kinds of bacterial infections. It will not work for colds, flu, or other viral infections. This medicine may be used for other purposes; ask your health care provider or pharmacist if you have questions. COMMON BRAND NAME(S): Amoxil, Moxilin, Sumox, Trimox What should I tell my health care provider before I take this medicine? They need to know if you have any of these conditions: -asthma -kidney disease -an unusual or allergic reaction to amoxicillin, other penicillins, cephalosporin antibiotics, other medicines, foods, dyes, or preservatives -pregnant or trying to get pregnant -breast-feeding How should I use this medicine? Take this medicine by mouth with a glass of water. Follow the directions on your prescription label. You may take this medicine with food or on an empty stomach. Take your medicine at regular intervals. Do not take your medicine more often than directed. Take all of your medicine as directed even if you think your are better. Do not skip doses or stop your medicine early. Talk to your pediatrician regarding the use of this medicine in children. While this drug may be prescribed for selected conditions, precautions do apply. Overdosage: If you think you have taken too much of this medicine contact a poison control center or emergency room at once. NOTE: This medicine is only for you. Do not share this medicine with others. What if I miss a dose? If you miss a dose, take it as soon as you can. If it is almost time for your next dose, take only that dose. Do not take double or extra doses. What may interact with this medicine? -amiloride -birth control  pills -chloramphenicol -macrolides -probenecid -sulfonamides -tetracyclines This list may not describe all possible interactions. Give your health care provider a list of all the medicines, herbs, non-prescription drugs, or dietary supplements you use. Also tell them if you smoke, drink alcohol, or use illegal drugs. Some items may interact with your medicine. What should I watch for while using this medicine? Tell your doctor or health care professional if your symptoms do not improve in 2 or 3 days. Take all of the doses of your medicine as directed. Do not skip doses or stop your medicine early. If you are diabetic, you may get a false positive result for sugar in your urine with certain brands of urine tests. Check with your doctor. Do not treat diarrhea with over-the-counter products. Contact your doctor if you have diarrhea that lasts more than 2 days or if the diarrhea is severe and watery. What side effects may I notice from receiving this medicine? Side effects that you should report to your doctor or health care professional as soon as possible: -allergic reactions like skin rash, itching or hives, swelling of the face, lips, or tongue -breathing problems -dark urine -redness, blistering, peeling or loosening of the skin, including  inside the mouth -seizures -severe or watery diarrhea -trouble passing urine or change in the amount of urine -unusual bleeding or bruising -unusually weak or tired -yellowing of the eyes or skin Side effects that usually do not require medical attention (report to your doctor or health care professional if they continue or are bothersome): -dizziness -headache -stomach upset -trouble sleeping This list may not describe all possible side effects. Call your doctor for medical advice about side effects. You may report side effects to FDA at 1-800-FDA-1088. Where should I keep my medicine? Keep out of the reach of children. Store between 68 and 77  degrees F (20 and 25 degrees C). Keep bottle closed tightly. Throw away any unused medicine after the expiration date. NOTE: This sheet is a summary. It may not cover all possible information. If you have questions about this medicine, talk to your doctor, pharmacist, or health care provider.  2015, [employer] Standard. (2007-06-04 14:10:59)  Albuterol inhalation aerosol What is this medicine? ALBUTEROL (al [place]) is a bronchodilator. It helps open up the airways in your lungs to make it easier to breathe. This medicine is used to treat and to prevent bronchospasm. This medicine may be used for other purposes; ask your health care provider or pharmacist if you have questions. COMMON BRAND NAME(S): Proair HFA, Proventil, Proventil HFA, Respirol, Ventolin, Ventolin HFA What should I tell my health care provider before I take this medicine? They need to know if you have any of the following conditions: -diabetes -heart disease or irregular heartbeat -high blood pressure -pheochromocytoma -seizures -thyroid disease -an unusual or allergic reaction to albuterol, levalbuterol, sulfites, other medicines, foods, dyes, or preservatives -pregnant or trying to get pregnant -breast-feeding How should I use this medicine? This medicine is for inhalation through the mouth. Follow the directions on your prescription label. Take your medicine at regular intervals. Do not use more often than directed. Make sure that you are using your inhaler correctly. Ask you doctor or health care provider if you have any questions. Talk to your pediatrician regarding the use of this medicine in children. Special care may be needed. Overdosage: If you think you have taken too much of this medicine contact a poison control center or emergency room at once. NOTE: This medicine is only for you. Do not share this medicine with others. What if I miss a dose? If you miss a dose, use it as soon as you can. If it is  almost time for your next dose, use only that dose. Do not use double or extra doses. What may interact with this medicine? -anti-infectives like chloroquine and pentamidine -caffeine -cisapride -diuretics -medicines for colds -medicines for depression or for emotional or psychotic conditions -medicines for weight loss including some herbal products -methadone -some antibiotics like clarithromycin, erythromycin, levofloxacin, and linezolid -some heart medicines -steroid hormones like dexamethasone, cortisone, hydrocortisone -theophylline -thyroid hormones This list may not describe all possible interactions. Give your health care provider a list of all the medicines, herbs, non-prescription drugs, or dietary supplements you use. Also tell them if you smoke, drink alcohol, or use illegal drugs. Some items may interact with your medicine. What should I watch for while using this medicine? Tell your doctor or health care professional if your symptoms do not improve. Do not use extra albuterol. If your asthma or bronchitis gets worse while you are using this medicine, call your doctor right away. If your mouth gets dry try chewing sugarless gum or sucking hard candy.  Drink water as directed. What side effects may I notice from receiving this medicine? Side effects that you should report to your doctor or health care professional as soon as possible: -allergic reactions like skin rash, itching or hives, swelling of the face, lips, or tongue -breathing problems -chest pain -feeling faint or lightheaded, falls -high blood pressure -irregular heartbeat -fever -muscle cramps or weakness -pain, tingling, numbness in the hands or feet -vomiting Side effects that usually do not require medical attention (report to your doctor or health care professional if they continue or are bothersome): -cough -difficulty sleeping -headache -nervousness or trembling -stomach upset -stuffy or runny  nose -throat irritation -unusual taste This list may not describe all possible side effects. Call your doctor for medical advice about side effects. You may report side effects to FDA at 1-800-FDA-1088. Where should I keep my medicine? Keep out of the reach of children. Store at room temperature between 15 and 30 degrees C (59 and 86 degrees F). The contents are under pressure and may burst when exposed to heat or flame. Do not freeze. This medicine does not work as well if it is too cold. Throw away any unused medicine after the expiration date. Inhalers need to be thrown away after the labeled number of puffs have been used or by the expiration date; whichever comes first. Ventolin HFA should be thrown away 12 months after removing from foil pouch. Check the instructions that come with your medicine. NOTE: This sheet is a summary. It may not cover all possible information. If you have questions about this medicine, talk to your doctor, pharmacist, or health care provider.  2015, [employer]vier/Gold Standard. (2012-08-29 10:57:17)  Prednisone tablets What is this medicine? PREDNISONE (PRED ni sone) is a corticosteroid. It is commonly used to treat inflammation of the skin, joints, lungs, and other organs. Common conditions treated include asthma, allergies, and arthritis. It is also used for other conditions, such as blood disorders and diseases of the adrenal glands. This medicine may be used for other purposes; ask your health care provider or pharmacist if you have questions. COMMON BRAND NAME(S): Deltasone, Predone, Sterapred, Sterapred DS What should I tell my health care provider before I take this medicine? They need to know if you have any of these conditions: -Cushing's syndrome -diabetes -glaucoma -heart disease -high blood pressure -infection (especially a virus infection such as chickenpox, cold sores, or herpes) -kidney disease -liver disease -mental illness -myasthenia  gravis -osteoporosis -seizures -stomach or intestine problems -thyroid disease -an unusual or allergic reaction to lactose, prednisone, other medicines, foods, dyes, or preservatives -pregnant or trying to get pregnant -breast-feeding How should I use this medicine? Take this medicine by mouth with a glass of water. Follow the directions on the prescription label. Take this medicine with food. If you are taking this medicine once a day, take it in the morning. Do not take more medicine than you are told to take. Do not suddenly stop taking your medicine because you may develop a severe reaction. Your doctor will tell you how much medicine to take. If your doctor wants you to stop the medicine, the dose may be slowly lowered over time to avoid any side effects. Talk to your pediatrician regarding the use of this medicine in children. Special care may be needed. Overdosage: If you think you have taken too much of this medicine contact a poison control center or emergency room at once. NOTE: This medicine is only for you. Do not share this  medicine with others. What if I miss a dose? If you miss a dose, take it as soon as you can. If it is almost time for your next dose, talk to your doctor or health care professional. You may need to miss a dose or take an extra dose. Do not take double or extra doses without advice. What may interact with this medicine? Do not take this medicine with any of the following medications: -metyrapone -mifepristone This medicine may also interact with the following medications: -aminoglutethimide -amphotericin B -aspirin and aspirin-like medicines -barbiturates -certain medicines for diabetes, like glipizide or glyburide -cholestyramine -cholinesterase inhibitors -cyclosporine -digoxin -diuretics -ephedrine -male hormones, like estrogens and birth control pills -isoniazid -ketoconazole -NSAIDS, medicines for pain and inflammation, like ibuprofen or  naproxen -phenytoin -rifampin -toxoids -vaccines -warfarin This list may not describe all possible interactions. Give your health care provider a list of all the medicines, herbs, non-prescription drugs, or dietary supplements you use. Also tell them if you smoke, drink alcohol, or use illegal drugs. Some items may interact with your medicine. What should I watch for while using this medicine? Visit your doctor or health care professional for regular checks on your progress. If you are taking this medicine over a prolonged period, carry an identification card with your name and address, the type and dose of your medicine, and your doctor's name and address. This medicine may increase your risk of getting an infection. Tell your doctor or health care professional if you are around anyone with measles or chickenpox, or if you develop sores or blisters that do not heal properly. If you are going to have surgery, tell your doctor or health care professional that you have taken this medicine within the last twelve months. Ask your doctor or health care professional about your diet. You may need to lower the amount of salt you eat. This medicine may affect blood sugar levels. If you have diabetes, check with your doctor or health care professional before you change your diet or the dose of your diabetic medicine. What side effects may I notice from receiving this medicine? Side effects that you should report to your doctor or health care professional as soon as possible: -allergic reactions like skin rash, itching or hives, swelling of the face, lips, or tongue -changes in emotions or moods -changes in vision -depressed mood -eye pain -fever or chills, cough, sore throat, pain or difficulty passing urine -increased thirst -swelling of ankles, feet Side effects that usually do not require medical attention (report to your doctor or health care professional if they continue or are  bothersome): -confusion, excitement, restlessness -headache -nausea, vomiting -skin problems, acne, thin and shiny skin -trouble sleeping -weight gain This list may not describe all possible side effects. Call your doctor for medical advice about side effects. You may report side effects to FDA at 1-800-FDA-1088. Where should I keep my medicine? Keep out of the reach of children. Store at room temperature between 15 and 30 degrees C (59 and 86 degrees F). Protect from light. Keep container tightly closed. Throw away any unused medicine after the expiration date. NOTE: This sheet is a summary. It may not cover all possible information. If you have questions about this medicine, talk to your doctor, pharmacist, or health care provider.  2015, [employer] Standard. (2010-10-27 10:57:14)

## 2014-03-21 NOTE — ED Notes (Signed)
Patient transported to X-ray 
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
## 2014-03-21 NOTE — ED Notes (Signed)
Patient here with complaint of shortness of breath. States that he has been having issues for about 5 days. Has seen PCP and was told to pick up Inhaler from pharmacy. Patient reports that inhaler only worked twice and then stopped working. Additionally reports some lower left chest pain.

## 2014-03-30 ENCOUNTER — Ambulatory Visit: Payer: Medicare Other | Admitting: Cardiology

## 2014-04-01 ENCOUNTER — Encounter (HOSPITAL_COMMUNITY): Payer: Self-pay | Admitting: Family Medicine

## 2014-04-01 ENCOUNTER — Emergency Department (HOSPITAL_COMMUNITY)
Admission: EM | Admit: 2014-04-01 | Discharge: 2014-04-01 | Disposition: A | Discharge status: Home / Self Care | Payer: Medicare Other | Attending: Emergency Medicine | Admitting: Emergency Medicine

## 2014-04-01 DIAGNOSIS — Z79899 Other long term (current) drug therapy: Secondary | ICD-10-CM | POA: Insufficient documentation

## 2014-04-01 DIAGNOSIS — Z7952 Long term (current) use of systemic steroids: Secondary | ICD-10-CM | POA: Insufficient documentation

## 2014-04-01 DIAGNOSIS — Z794 Long term (current) use of insulin: Secondary | ICD-10-CM | POA: Insufficient documentation

## 2014-04-01 DIAGNOSIS — I2510 Atherosclerotic heart disease of native coronary artery without angina pectoris: Secondary | ICD-10-CM | POA: Insufficient documentation

## 2014-04-01 DIAGNOSIS — M1990 Unspecified osteoarthritis, unspecified site: Secondary | ICD-10-CM | POA: Insufficient documentation

## 2014-04-01 DIAGNOSIS — Z7982 Long term (current) use of aspirin: Secondary | ICD-10-CM | POA: Insufficient documentation

## 2014-04-01 DIAGNOSIS — Z7901 Long term (current) use of anticoagulants: Secondary | ICD-10-CM | POA: Diagnosis not present

## 2014-04-01 DIAGNOSIS — K625 Hemorrhage of anus and rectum: Secondary | ICD-10-CM | POA: Diagnosis present

## 2014-04-01 DIAGNOSIS — I5032 Chronic diastolic (congestive) heart failure: Secondary | ICD-10-CM | POA: Diagnosis not present

## 2014-04-01 DIAGNOSIS — I10 Essential (primary) hypertension: Secondary | ICD-10-CM | POA: Insufficient documentation

## 2014-04-01 DIAGNOSIS — Z720 Tobacco use: Secondary | ICD-10-CM | POA: Diagnosis not present

## 2014-04-01 DIAGNOSIS — K644 Residual hemorrhoidal skin tags: Secondary | ICD-10-CM

## 2014-04-01 DIAGNOSIS — J449 Chronic obstructive pulmonary disease, unspecified: Secondary | ICD-10-CM | POA: Diagnosis not present

## 2014-04-01 DIAGNOSIS — E119 Type 2 diabetes mellitus without complications: Secondary | ICD-10-CM | POA: Insufficient documentation

## 2014-04-01 DIAGNOSIS — D649 Anemia, unspecified: Secondary | ICD-10-CM | POA: Diagnosis not present

## 2014-04-01 DIAGNOSIS — Z8701 Personal history of pneumonia (recurrent): Secondary | ICD-10-CM | POA: Insufficient documentation

## 2014-04-01 DIAGNOSIS — K921 Melena: Secondary | ICD-10-CM | POA: Diagnosis not present

## 2014-04-01 DIAGNOSIS — E785 Hyperlipidemia, unspecified: Secondary | ICD-10-CM | POA: Diagnosis not present

## 2014-04-01 DIAGNOSIS — Z792 Long term (current) use of antibiotics: Secondary | ICD-10-CM | POA: Insufficient documentation

## 2014-04-01 DIAGNOSIS — I480 Paroxysmal atrial fibrillation: Secondary | ICD-10-CM | POA: Insufficient documentation

## 2014-04-01 DIAGNOSIS — I4892 Unspecified atrial flutter: Secondary | ICD-10-CM | POA: Diagnosis not present

## 2014-04-01 LAB — COMPREHENSIVE METABOLIC PANEL
ALT: 46 U/L (ref 0–53)
AST: 30 U/L (ref 0–37)
Albumin: 3.6 g/dL (ref 3.5–5.2)
Alkaline Phosphatase: 83 U/L (ref 39–117)
Anion gap: 12 (ref 5–15)
BILIRUBIN TOTAL: 0.3 mg/dL (ref 0.3–1.2)
BUN: 15 mg/dL (ref 6–23)
CO2: 22 mmol/L (ref 19–32)
CREATININE: 0.91 mg/dL (ref 0.50–1.35)
Calcium: 9.4 mg/dL (ref 8.4–10.5)
Chloride: 104 mEq/L (ref 96–112)
GFR calc non Af Amer: 81 mL/min — ABNORMAL LOW (ref >90)
Glucose, Bld: 160 mg/dL — ABNORMAL HIGH (ref 70–99)
Potassium: 4.6 mmol/L (ref 3.5–5.1)
Sodium: 138 mmol/L (ref 135–145)
TOTAL PROTEIN: 6.5 g/dL (ref 6.0–8.3)

## 2014-04-01 LAB — CBC
HCT: 43.5 % (ref 39.0–52.0)
Hemoglobin: 14 g/dL (ref 13.0–17.0)
MCH: 27.8 pg (ref 26.0–34.0)
MCHC: 32.2 g/dL (ref 30.0–36.0)
MCV: 86.5 fL (ref 78.0–100.0)
PLATELETS: 246 10*3/uL (ref 150–400)
RBC: 5.03 MIL/uL (ref 4.22–5.81)
RDW: 13.8 % (ref 11.5–15.5)
WBC: 10.4 10*3/uL (ref 4.0–10.5)

## 2014-04-01 LAB — PROTIME-INR
INR: 1.63 — ABNORMAL HIGH (ref 0.00–1.49)
Prothrombin Time: 19.4 seconds — ABNORMAL HIGH (ref 11.6–15.2)

## 2014-04-01 NOTE — ED Notes (Addendum)
Called lab to follow up on PT/INR results per Dr. Colin Rhein.

## 2014-04-01 NOTE — ED Notes (Signed)
Notified Dr. Colin Rhein of PT/INR levels. Given ok to D/C with follow up.

## 2014-04-01 NOTE — ED Notes (Addendum)
Pt from home for eval of blood in stool only when passing a bowel movement. Denies passing any clots, n/v/d or stool changes. Pt denies any constipation or abdominal pain. States hx of same since colonoscopy, has tried prep H and tramadol with no relief. NAD.

## 2014-04-01 NOTE — ED Notes (Signed)
Pt here for rectal bleeding x 2 weeks. sts possible hemorrhoid.
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
## 2014-04-01 NOTE — Discharge Instructions (Signed)

## 2014-04-01 NOTE — ED Notes (Signed)
Pt d/c wheelchair, escorted to waiting room by this RN 

## 2014-04-03 ENCOUNTER — Encounter: Payer: Self-pay | Admitting: Cardiology

## 2014-04-03 ENCOUNTER — Ambulatory Visit (INDEPENDENT_AMBULATORY_CARE_PROVIDER_SITE_OTHER): Payer: Medicare Other | Admitting: Cardiology

## 2014-04-03 ENCOUNTER — Ambulatory Visit (INDEPENDENT_AMBULATORY_CARE_PROVIDER_SITE_OTHER): Payer: Medicare Other | Admitting: Pharmacist Clinician (PhC)/ Clinical Pharmacy Specialist

## 2014-04-03 VITALS — BP 104/60 | HR 96 | Ht 66.0 in | Wt 181.0 lb

## 2014-04-03 DIAGNOSIS — I480 Paroxysmal atrial fibrillation: Secondary | ICD-10-CM

## 2014-04-03 DIAGNOSIS — Z8673 Personal history of transient ischemic attack (TIA), and cerebral infarction without residual deficits: Secondary | ICD-10-CM | POA: Insufficient documentation

## 2014-04-03 DIAGNOSIS — Z7901 Long term (current) use of anticoagulants: Secondary | ICD-10-CM

## 2014-04-03 DIAGNOSIS — I4891 Unspecified atrial fibrillation: Secondary | ICD-10-CM

## 2014-04-03 DIAGNOSIS — I10 Essential (primary) hypertension: Secondary | ICD-10-CM

## 2014-04-03 LAB — POCT INR: INR: 1.9

## 2014-04-03 MED ORDER — METOPROLOL SUCCINATE ER 25 MG PO TB24: 12.5000 mg | ORAL_TABLET | Freq: Every day | ORAL | Status: DC

## 2014-04-03 MED ORDER — DILTIAZEM HCL ER COATED BEADS 120 MG PO CP24: 120.0000 mg | ORAL_CAPSULE | Freq: Every day | ORAL | Status: DC

## 2014-04-03 NOTE — Progress Notes (Signed)
04/03/2014 [name]   1940-07-23  [id number]  Primary Physician [name], MD Primary Cardiologist: Dr [name]  HPI:  74 y/o admitted to the hospital 04/17/13 with chest pain and dizziness. He had non obstructive CAD at cath Jan 2012. He has PAF and has been maintained on Flecainide and Coumadin. He was found to be bradycardic and hypotensive. His Toprol was stopped and Diltiazem decreased. He is in the office today for follow up. He says he has been doing well, he denies palpitations or dizziness. He has noted his HR is in the high 90s.   Current Outpatient Prescriptions  Medication Sig Dispense Refill  . albuterol (PROVENTIL HFA;VENTOLIN HFA) 108 (90 BASE) MCG/ACT inhaler Inhale 2 puffs into the lungs every 4 (four) hours as needed for wheezing or shortness of breath (or coughing). 1 Inhaler 0  . aspirin 81 MG chewable tablet Chew 81 mg by mouth daily.    [name] diltiazem (CARDIZEM CD) 120 MG 24 hr capsule Take 1 capsule (120 mg total) by mouth daily. 90 capsule 3  . ferrous sulfate 325 (65 FE) MG tablet Take 325 mg by mouth daily with breakfast.    . flecainide (TAMBOCOR) 150 MG tablet Take 225 mg by mouth 2 (two) times daily.    . hydrocortisone cream 1 % Apply 1 application topically 2 (two) times daily as needed for itching.    . insulin glargine (LANTUS SOLOSTAR) 100 UNIT/ML injection Inject 3 Units into the skin 4 (four) times daily.     [name] loratadine (CLARITIN) 10 MG tablet Take 10 mg by mouth every morning.     . metFORMIN (GLUCOPHAGE) 1000 MG tablet Take 1,000 mg by mouth 2 (two) times daily with a meal.     . nitroGLYCERIN (NITROSTAT) 0.4 MG SL tablet Place 0.4 mg under the tongue every 5 (five) minutes x 3 doses as needed for chest pain.     . pravastatin (PRAVACHOL) 40 MG tablet Take 40 mg by mouth every evening.     . predniSONE (DELTASONE) 20 MG tablet Take 2 tablets (40 mg total) by mouth daily. 10 tablet 0  . traMADol (ULTRAM) 50 MG tablet Take 50 mg by mouth every 6  (six) hours as needed (pain).     [name] warfarin (COUMADIN) 5 MG tablet Take 5-7.5 mg by mouth every evening. Take 5mg  on Tuesday, Thursday, Saturday and Sunday.  Take 7.5mg  on Monday, Wednesday, and Friday    . metoprolol succinate (TOPROL XL) 25 MG 24 hr tablet Take 0.5 tablets (12.5 mg total) by mouth daily. 30 tablet 6  . mupirocin ointment (BACTROBAN) 2 %      No current facility-administered medications for this visit.    No Known Allergies  History   Social History  . Marital Status: Widowed    Spouse Name: [name]    Number of Children: [name]  . Years of Education: [name]   Occupational History  . Not on file.   Social History Main Topics  . Smoking status: Current Every Day Smoker -- 0.50 packs/day for 50 years    Types: Cigarettes  . Smokeless tobacco: Never Used     Comment: He's been smoking between 0.5 and 1 ppd since age 38.  [name] Alcohol Use: No  . Drug Use: No  . Sexual Activity: No   Other Topics Concern  . Not on file   Social History Narrative   He is a widower, who recently moved to [place] back in 2011 to  live closer to his daughter. He formerly lived in [place]. He is a father 27, grandfather, and great-grandfather of 60. Unfortunately as I mentioned he's gone back to smoking. Smoking about a half pack a day now. He is not very active, but does try to get outside and walk some. He does not drink alcohol.     Review of Systems: General: negative for chills, fever, night sweats or weight changes.  Cardiovascular: negative for chest pain, dyspnea on exertion, edema, orthopnea, palpitations, paroxysmal nocturnal dyspnea or shortness of breath Dermatological: negative for rash Respiratory: negative for cough or wheezing Urologic: negative for hematuria Abdominal: negative for nausea, vomiting, diarrhea, bright red blood per rectum, melena, or hematemesis Neurologic: negative for visual changes, syncope, or dizziness All other systems reviewed and are otherwise  negative except as noted above.    Blood pressure 104/60, pulse 96, height 5\' 6"  (1.676 m), weight 181 lb (82.101 kg).  General appearance: alert, cooperative and no distress Lungs: decreased, scattere wheezing Heart: regular rate and rhythm  EKG NSR 96  ASSESSMENT AND PLAN:   PAF (paroxysmal atrial fibrillation), maintaining SR On Flecainide  Long term current use of anticoagulant therapy Coumadin  Hypertension Admitted in Nov 2015 and Diltiazem decreased, Toprol stopped  CAD (coronary artery disease) nonobstructive  History of CVA (cerebrovascular accident) .  DM (diabetes mellitus), type 2 .    PLAN  I added back Toprol 12.5 mg. He'll f/u with Dr [name] in 3 months.  Tonnya Garbett KPA-C 04/03/2014 11:16 AM

## 2014-04-03 NOTE — Assessment & Plan Note (Signed)
Coumadin 

## 2014-04-03 NOTE — Assessment & Plan Note (Signed)
nonobstructive

## 2014-04-03 NOTE — Assessment & Plan Note (Signed)
Admitted in Nov 2015 and Diltiazem decreased, Toprol stopped

## 2014-04-03 NOTE — Patient Instructions (Signed)
Your physician has recommended you make the following change in your medication: start new prescription for metoprolol succ. 25 mg. This has already be sent to your pharmacy.  Your physician recommends that you schedule a follow-up appointment in: 3 months with Dr. [name].

## 2014-04-03 NOTE — Assessment & Plan Note (Signed)
On Flecainide

## 2014-04-04 IMAGING — CR DG CHEST 2V
2 series · 2 of 2 positions shown · non-contrast
Comparison: 07/02/2012.

CLINICAL DATA: Cough. Fever. Flu like symptoms for 2 days.

EXAM:
CHEST  2 VIEW

[w chest pa]
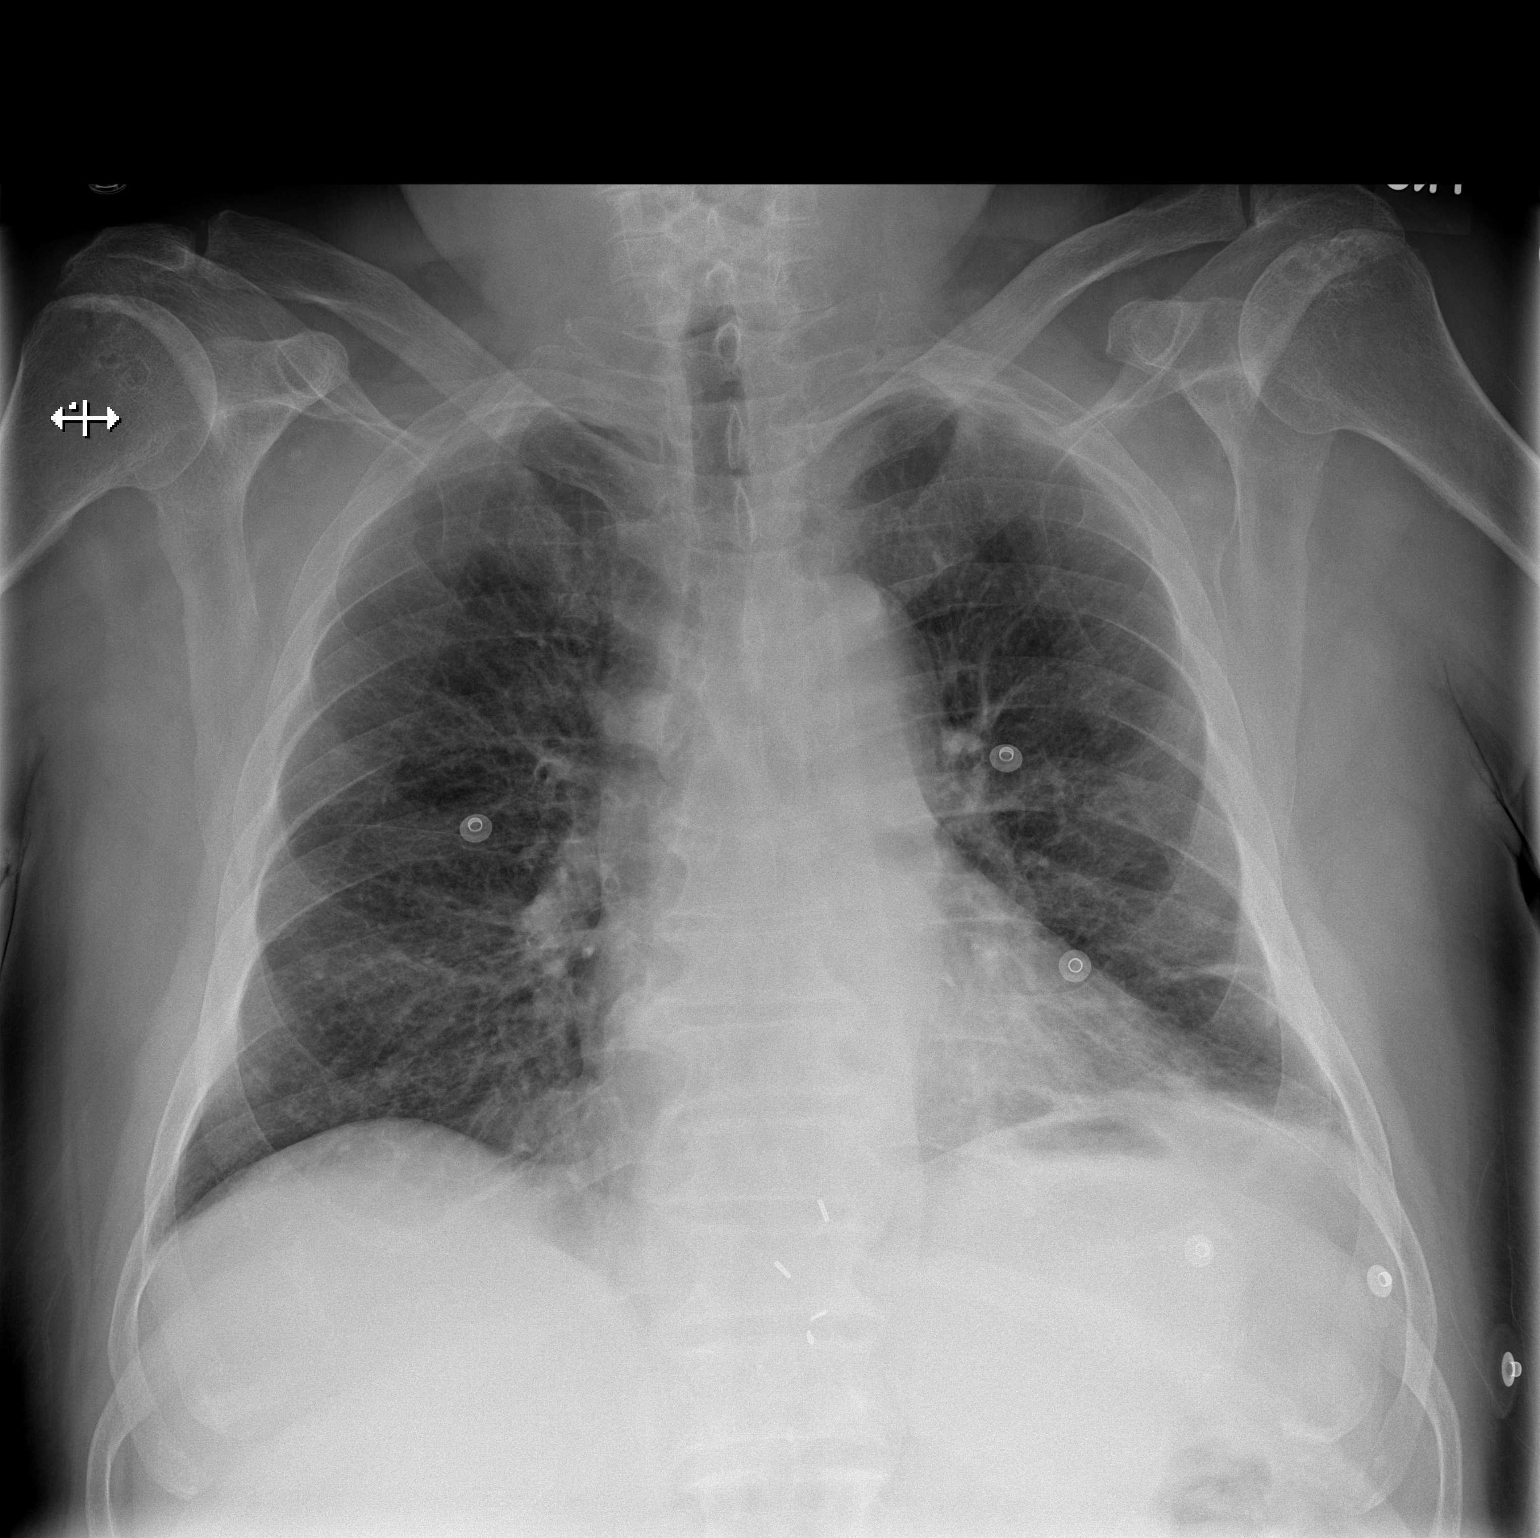

[w chest lat]
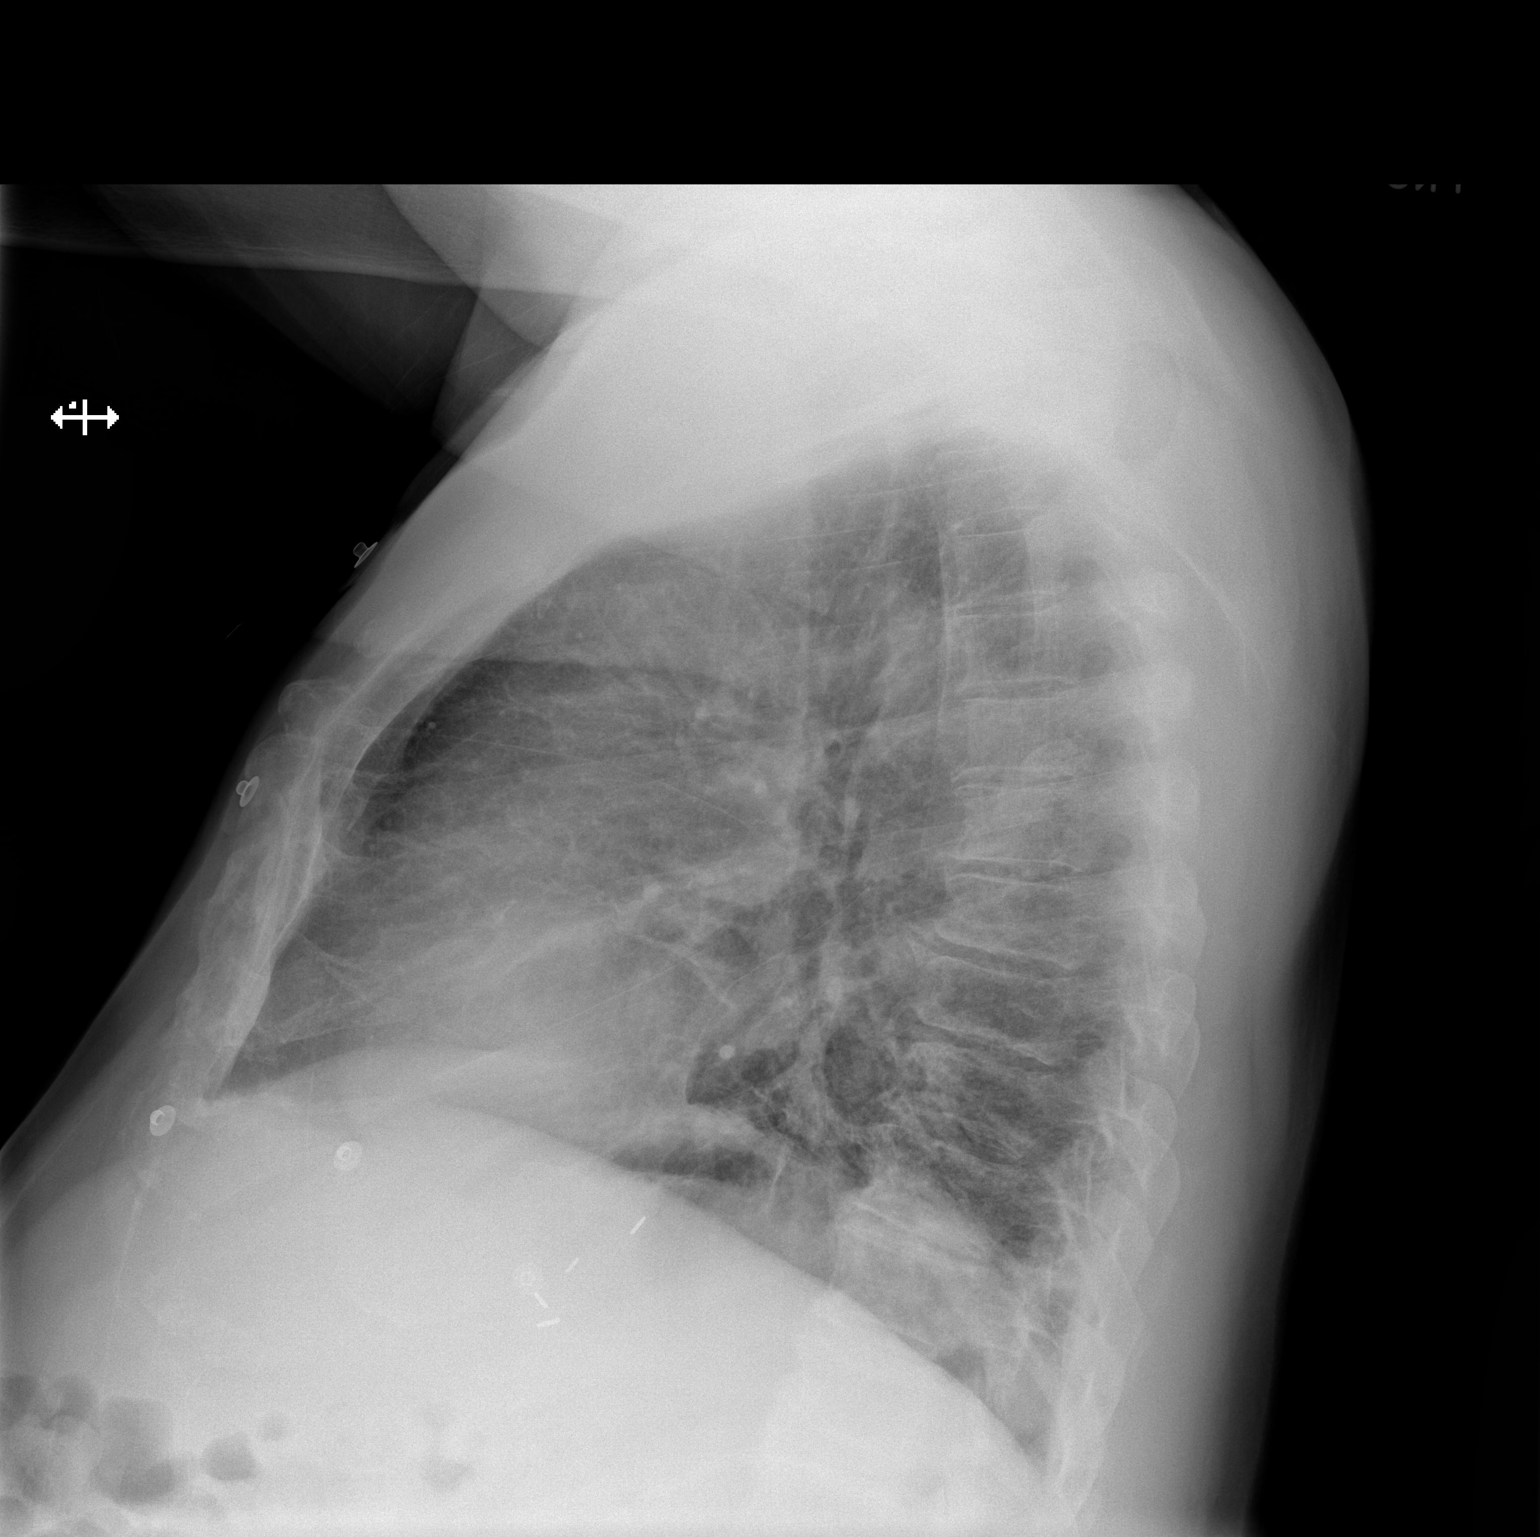

[2 of 2 positions shown; findings below may reference images not displayed]

FINDINGS: Nonspecific diffuse interstitial prominence is present. The
cardiopericardial silhouette is upper limits of normal for
projection. Surgical clips are present overlying the
gastroesophageal junction. Basilar atelectasis. No airspace disease.
There is no pleural effusion.
IMPRESSION: Chronic changes of the chest with lower lung volumes and basilar
atelectasis.

## 2014-04-09 ENCOUNTER — Encounter (HOSPITAL_COMMUNITY): Payer: Self-pay | Admitting: Gastroenterology

## 2014-04-24 ENCOUNTER — Ambulatory Visit (INDEPENDENT_AMBULATORY_CARE_PROVIDER_SITE_OTHER): Payer: Medicare Other | Admitting: Pharmacist Clinician (PhC)/ Clinical Pharmacy Specialist

## 2014-04-24 DIAGNOSIS — I480 Paroxysmal atrial fibrillation: Secondary | ICD-10-CM

## 2014-04-24 DIAGNOSIS — Z7901 Long term (current) use of anticoagulants: Secondary | ICD-10-CM

## 2014-04-24 LAB — POCT INR: INR: 1.7

## 2014-05-08 ENCOUNTER — Ambulatory Visit (INDEPENDENT_AMBULATORY_CARE_PROVIDER_SITE_OTHER): Payer: Medicare Other | Admitting: Pharmacist Clinician (PhC)/ Clinical Pharmacy Specialist

## 2014-05-08 DIAGNOSIS — Z7901 Long term (current) use of anticoagulants: Secondary | ICD-10-CM

## 2014-05-08 DIAGNOSIS — I480 Paroxysmal atrial fibrillation: Secondary | ICD-10-CM

## 2014-05-08 LAB — POCT INR: INR: 1.4

## 2014-05-22 ENCOUNTER — Ambulatory Visit (INDEPENDENT_AMBULATORY_CARE_PROVIDER_SITE_OTHER): Payer: Medicare Other | Admitting: Pharmacist Clinician (PhC)/ Clinical Pharmacy Specialist

## 2014-05-22 DIAGNOSIS — I480 Paroxysmal atrial fibrillation: Secondary | ICD-10-CM

## 2014-05-22 DIAGNOSIS — Z7901 Long term (current) use of anticoagulants: Secondary | ICD-10-CM

## 2014-05-22 LAB — POCT INR: INR: 2.2

## 2014-05-22 MED ORDER — NITROGLYCERIN 0.4 MG SL SUBL: 0.4000 mg | SUBLINGUAL_TABLET | SUBLINGUAL | Status: DC | PRN

## 2014-05-22 MED ORDER — METOPROLOL SUCCINATE ER 25 MG PO TB24: 12.5000 mg | ORAL_TABLET | Freq: Every day | ORAL | Status: DC

## 2014-05-24 ENCOUNTER — Encounter (HOSPITAL_COMMUNITY): Payer: Self-pay | Admitting: *Deleted

## 2014-05-24 ENCOUNTER — Emergency Department (HOSPITAL_COMMUNITY)
Admission: EM | Admit: 2014-05-24 | Discharge: 2014-05-24 | Disposition: A | Discharge status: Home / Self Care | Payer: Medicare Other | Attending: Emergency Medicine | Admitting: Emergency Medicine

## 2014-05-24 ENCOUNTER — Emergency Department (HOSPITAL_COMMUNITY): Payer: Medicare Other

## 2014-05-24 DIAGNOSIS — J449 Chronic obstructive pulmonary disease, unspecified: Secondary | ICD-10-CM | POA: Diagnosis not present

## 2014-05-24 DIAGNOSIS — E119 Type 2 diabetes mellitus without complications: Secondary | ICD-10-CM | POA: Insufficient documentation

## 2014-05-24 DIAGNOSIS — Z794 Long term (current) use of insulin: Secondary | ICD-10-CM | POA: Insufficient documentation

## 2014-05-24 DIAGNOSIS — I5032 Chronic diastolic (congestive) heart failure: Secondary | ICD-10-CM | POA: Insufficient documentation

## 2014-05-24 DIAGNOSIS — E785 Hyperlipidemia, unspecified: Secondary | ICD-10-CM | POA: Diagnosis not present

## 2014-05-24 DIAGNOSIS — Z7982 Long term (current) use of aspirin: Secondary | ICD-10-CM | POA: Diagnosis not present

## 2014-05-24 DIAGNOSIS — I2510 Atherosclerotic heart disease of native coronary artery without angina pectoris: Secondary | ICD-10-CM | POA: Insufficient documentation

## 2014-05-24 DIAGNOSIS — I4892 Unspecified atrial flutter: Secondary | ICD-10-CM | POA: Insufficient documentation

## 2014-05-24 DIAGNOSIS — Z720 Tobacco use: Secondary | ICD-10-CM | POA: Insufficient documentation

## 2014-05-24 DIAGNOSIS — I10 Essential (primary) hypertension: Secondary | ICD-10-CM | POA: Diagnosis not present

## 2014-05-24 DIAGNOSIS — J40 Bronchitis, not specified as acute or chronic: Secondary | ICD-10-CM

## 2014-05-24 DIAGNOSIS — Z79899 Other long term (current) drug therapy: Secondary | ICD-10-CM | POA: Diagnosis not present

## 2014-05-24 DIAGNOSIS — J209 Acute bronchitis, unspecified: Secondary | ICD-10-CM | POA: Diagnosis not present

## 2014-05-24 DIAGNOSIS — M1990 Unspecified osteoarthritis, unspecified site: Secondary | ICD-10-CM | POA: Diagnosis not present

## 2014-05-24 DIAGNOSIS — D649 Anemia, unspecified: Secondary | ICD-10-CM | POA: Diagnosis not present

## 2014-05-24 DIAGNOSIS — M791 Myalgia: Secondary | ICD-10-CM | POA: Diagnosis present

## 2014-05-24 DIAGNOSIS — Z8701 Personal history of pneumonia (recurrent): Secondary | ICD-10-CM | POA: Diagnosis not present

## 2014-05-24 DIAGNOSIS — Z7901 Long term (current) use of anticoagulants: Secondary | ICD-10-CM | POA: Diagnosis not present

## 2014-05-24 DIAGNOSIS — I480 Paroxysmal atrial fibrillation: Secondary | ICD-10-CM | POA: Insufficient documentation

## 2014-05-24 LAB — CBC WITH DIFFERENTIAL/PLATELET
BASOS ABS: 0 10*3/uL (ref 0.0–0.1)
BASOS PCT: 0 % (ref 0–1)
EOS ABS: 0.2 10*3/uL (ref 0.0–0.7)
Eosinophils Relative: 3 % (ref 0–5)
HCT: 40.6 % (ref 39.0–52.0)
Hemoglobin: 13.4 g/dL (ref 13.0–17.0)
LYMPHS ABS: 2.3 10*3/uL (ref 0.7–4.0)
Lymphocytes Relative: 32 % (ref 12–46)
MCH: 28.2 pg (ref 26.0–34.0)
MCHC: 33 g/dL (ref 30.0–36.0)
MCV: 85.5 fL (ref 78.0–100.0)
MONO ABS: 0.7 10*3/uL (ref 0.1–1.0)
Monocytes Relative: 9 % (ref 3–12)
Neutro Abs: 4.1 10*3/uL (ref 1.7–7.7)
Neutrophils Relative %: 56 % (ref 43–77)
Platelets: 220 10*3/uL (ref 150–400)
RBC: 4.75 MIL/uL (ref 4.22–5.81)
RDW: 13.9 % (ref 11.5–15.5)
WBC: 7.3 10*3/uL (ref 4.0–10.5)

## 2014-05-24 LAB — BASIC METABOLIC PANEL
ANION GAP: 8 (ref 5–15)
BUN: 11 mg/dL (ref 6–23)
CALCIUM: 9.3 mg/dL (ref 8.4–10.5)
CO2: 26 mmol/L (ref 19–32)
Chloride: 104 mmol/L (ref 96–112)
Creatinine, Ser: 0.92 mg/dL (ref 0.50–1.35)
GFR calc Af Amer: >90 mL/min (ref >90)
GFR calc non Af Amer: 80 mL/min — ABNORMAL LOW (ref >90)
Glucose, Bld: 115 mg/dL — ABNORMAL HIGH (ref 70–99)
Potassium: 4 mmol/L (ref 3.5–5.1)
Sodium: 138 mmol/L (ref 135–145)

## 2014-05-24 LAB — PROTIME-INR
INR: 2.18 — ABNORMAL HIGH (ref 0.00–1.49)
Prothrombin Time: 24.4 seconds — ABNORMAL HIGH (ref 11.6–15.2)

## 2014-05-24 MED ORDER — IPRATROPIUM-ALBUTEROL 0.5-2.5 (3) MG/3ML IN SOLN: 3.0000 mL | Freq: Once | RESPIRATORY_TRACT | Status: DC

## 2014-05-24 MED ORDER — ALBUTEROL SULFATE HFA 108 (90 BASE) MCG/ACT IN AERS
2.0000 | INHALATION_SPRAY | Freq: Once | RESPIRATORY_TRACT | Status: AC
  Administered 2014-05-24: 2 via RESPIRATORY_TRACT
  Filled 2014-05-24: qty 6.7

## 2014-05-24 MED ORDER — AZITHROMYCIN 250 MG PO TABS: 250.0000 mg | ORAL_TABLET | Freq: Every day | ORAL | Status: DC

## 2014-05-24 MED ORDER — AEROCHAMBER PLUS W/MASK MISC
1.0000 | Freq: Once | Status: AC
  Administered 2014-05-24: 1
  Filled 2014-05-24: qty 1
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
## 2014-05-24 NOTE — Discharge Instructions (Signed)
Return to the emergency room with worsening of symptoms, new symptoms or with symptoms that are concerning , especially fevers, stiff neck, worsening headache, nausea/vomiting, visual changes or slurred speech, chest pain, shortness of breath, cough with thick colored mucous or blood Drink plenty of fluids with electrolytes especially Gatorade. OTC cold medications such as mucinex, nyquil, dayquil are recommended.  Please take all of your antibiotics until finished!   You may develop abdominal discomfort or diarrhea from the antibiotic.  You may help offset this with probiotics which you can buy or get in yogurt. Do not eat  or take the probiotics until 2 hours after your antibiotic.  AFTER YOU HAVE FINISHED YOUR ANTIBIOTIC HAVE YOUR INR RECHECKED! Please call your doctor for a followup appointment within 24-48 hours. When you talk to your doctor please let them know that you were seen in the emergency department and have them acquire all of your records so that they can discuss the findings with you and formulate a treatment plan to fully care for your new and ongoing problems. Read below information and follow recommendations.   Acute Bronchitis Bronchitis is inflammation of the airways that extend from the windpipe into the lungs (bronchi). The inflammation often causes mucus to develop. This leads to a cough, which is the most common symptom of bronchitis.  In acute bronchitis, the condition usually develops suddenly and goes away over time, usually in a couple weeks. Smoking, allergies, and asthma can make bronchitis worse. Repeated episodes of bronchitis may cause further lung problems.  CAUSES Acute bronchitis is most often caused by the same virus that causes a cold. The virus can spread from person to person (contagious) through coughing, sneezing, and touching contaminated objects. SIGNS AND SYMPTOMS   Cough.   Fever.   Coughing up mucus.   Body aches.   Chest congestion.    Chills.   Shortness of breath.   Sore throat.  DIAGNOSIS  Acute bronchitis is usually diagnosed through a physical exam. Your health care provider will also ask you questions about your medical history. Tests, such as chest X-rays, are sometimes done to rule out other conditions.  TREATMENT  Acute bronchitis usually goes away in a couple weeks. Oftentimes, no medical treatment is necessary. Medicines are sometimes given for relief of fever or cough. Antibiotic medicines are usually not needed but may be prescribed in certain situations. In some cases, an inhaler may be recommended to help reduce shortness of breath and control the cough. A cool mist vaporizer may also be used to help thin bronchial secretions and make it easier to clear the chest.  HOME CARE INSTRUCTIONS  Get plenty of rest.   Drink enough fluids to keep your urine clear or pale yellow (unless you have a medical condition that requires fluid restriction). Increasing fluids may help thin your respiratory secretions (sputum) and reduce chest congestion, and it will prevent dehydration.   Take medicines only as directed by your health care provider.  If you were prescribed an antibiotic medicine, finish it all even if you start to feel better.  Avoid smoking and secondhand smoke. Exposure to cigarette smoke or irritating chemicals will make bronchitis worse. If you are a smoker, consider using nicotine gum or skin patches to help control withdrawal symptoms. Quitting smoking will help your lungs heal faster.   Reduce the chances of another bout of acute bronchitis by washing your hands frequently, avoiding people with cold symptoms, and trying not to touch your hands to your  mouth, nose, or eyes.   Keep all follow-up visits as directed by your health care provider.  SEEK MEDICAL CARE IF: Your symptoms do not improve after 1 week of treatment.  SEEK IMMEDIATE MEDICAL CARE IF:  You develop an increased fever or  chills.   You have chest pain.   You have severe shortness of breath.  You have bloody sputum.   You develop dehydration.  You faint or repeatedly feel like you are going to pass out.  You develop repeated vomiting.  You develop a severe headache. MAKE SURE YOU:   Understand these instructions.  Will watch your condition.  Will get help right away if you are not doing well or get worse. Document Released: 04/20/2004 Document Revised: 07/28/2013 Document Reviewed: 09/03/2012 [facility] Patient Information 2015 [place], [place]. This information is not intended to replace advice given to you by your health care provider. Make sure you discuss any questions you have with your health care provider.

## 2014-05-24 NOTE — ED Provider Notes (Signed)
The patient is a 74 year old male, he has a history of atrial fibrillation as well as lung disease and diabetes, he has been having increased cough with subjective fevers and chills for the last several days, feels as though he is bringing up-colored phlegm. He denies any swelling of the legs, he has mild shortness of breath when he ambulates but this is not significant according to the patient. On exam the patient has clear heart and lung sounds, no fever, no hypoxia, no peripheral edema or JVD, he has been able to ambulate to the bathroom without difficulty and a rapid pace. His chest x-ray reveals no signs of infiltrate, at this time he appears stable. The patient will be given an metered-dose inhaler with a spacer and instructions as he states he does not know how to use that at home. He will also be given an antibiotic, will avoid prednisone as the patient is a diabetic and does not have any wheezing. This is more likely infectious and less likely inflammatory.   Medical screening examination/treatment/procedure(s) were conducted as a shared visit with non-physician practitioner(s) and myself.  I personally evaluated the patient during the encounter.  Clinical Impression:   Final diagnoses:  Bronchitis         Johnna Acosta, MD 05/25/14 2038

## 2014-05-24 NOTE — ED Notes (Signed)
Pt. Left with all belongings and refused wheelchair 

## 2014-05-24 NOTE — ED Notes (Signed)
Patient presents stating he has been having chills since Friday and his "chest is plugged up"   States he is "chilling" now

## 2014-05-27 ENCOUNTER — Telehealth: Payer: Self-pay | Admitting: Cardiology

## 2014-05-27 IMAGING — CR DG HIP COMPLETE 2+V*R*
3 series · 3 of 3 positions shown · non-contrast
Comparison: No priors.

CLINICAL DATA: Right-sided hip pain for the past 6 days.

EXAM:
RIGHT HIP - COMPLETE 2+ VIEW

[t pelvis ap]
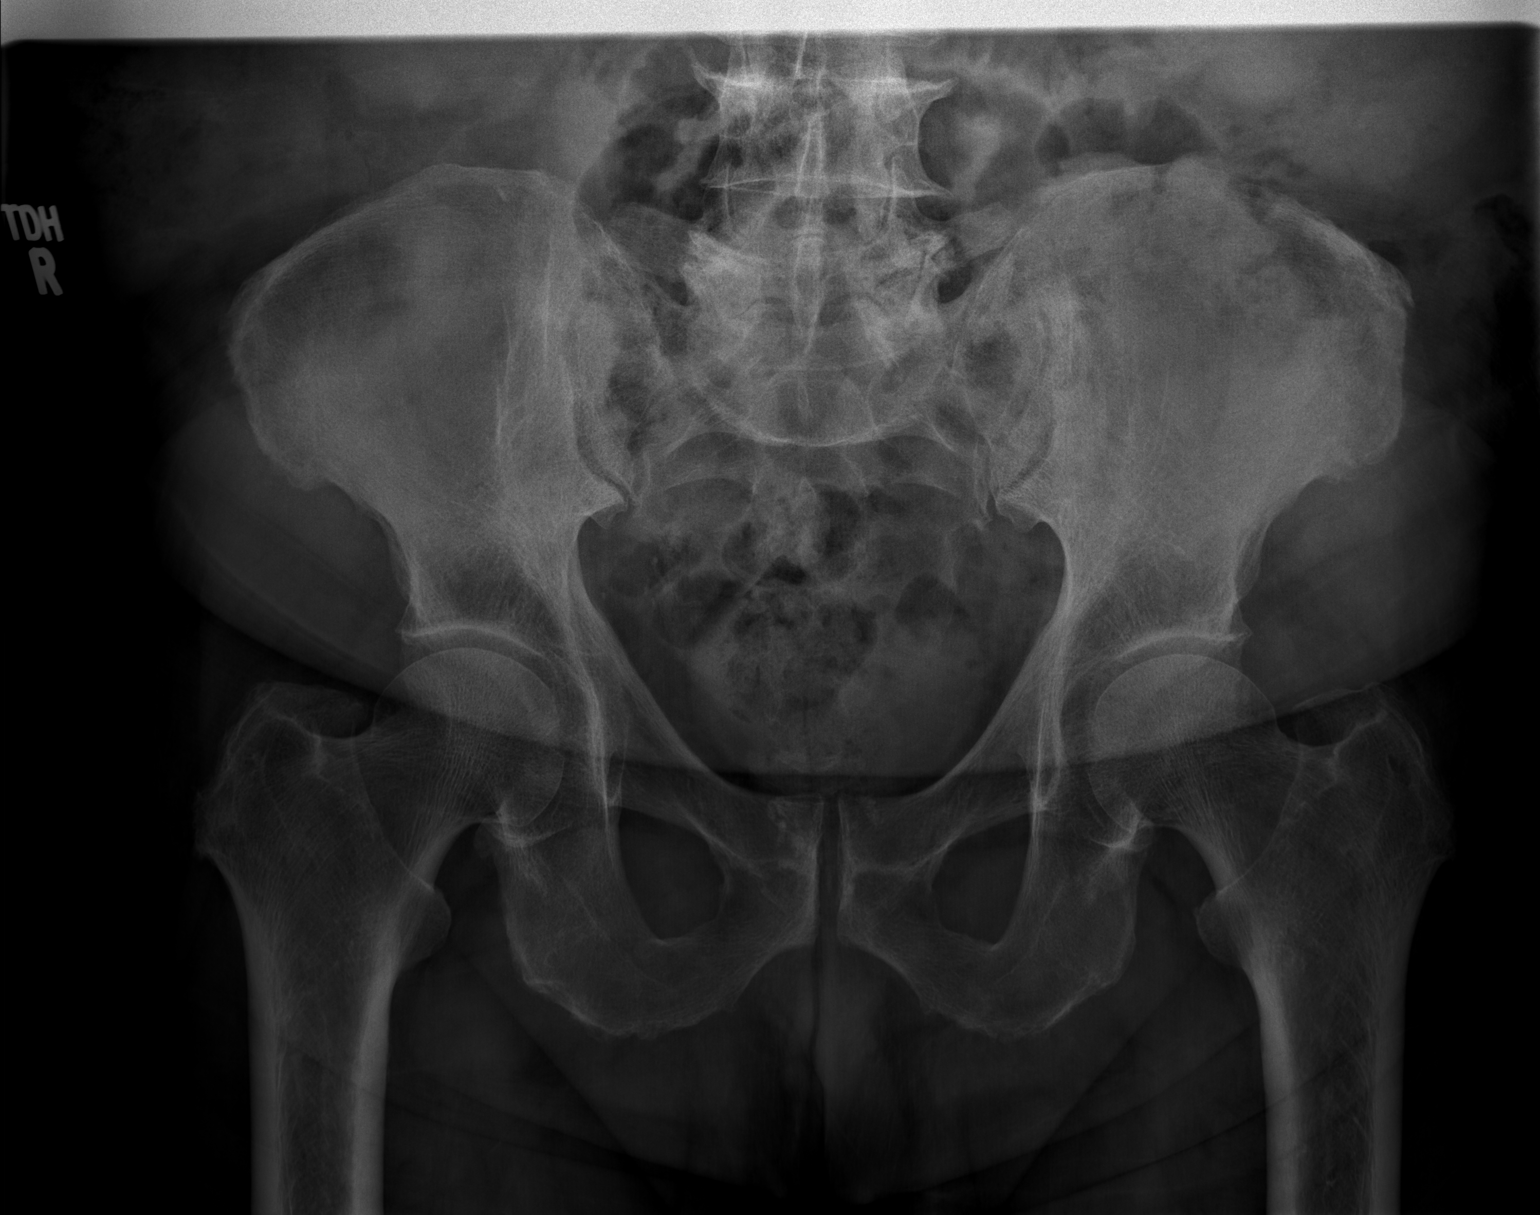

[t hip ap right]
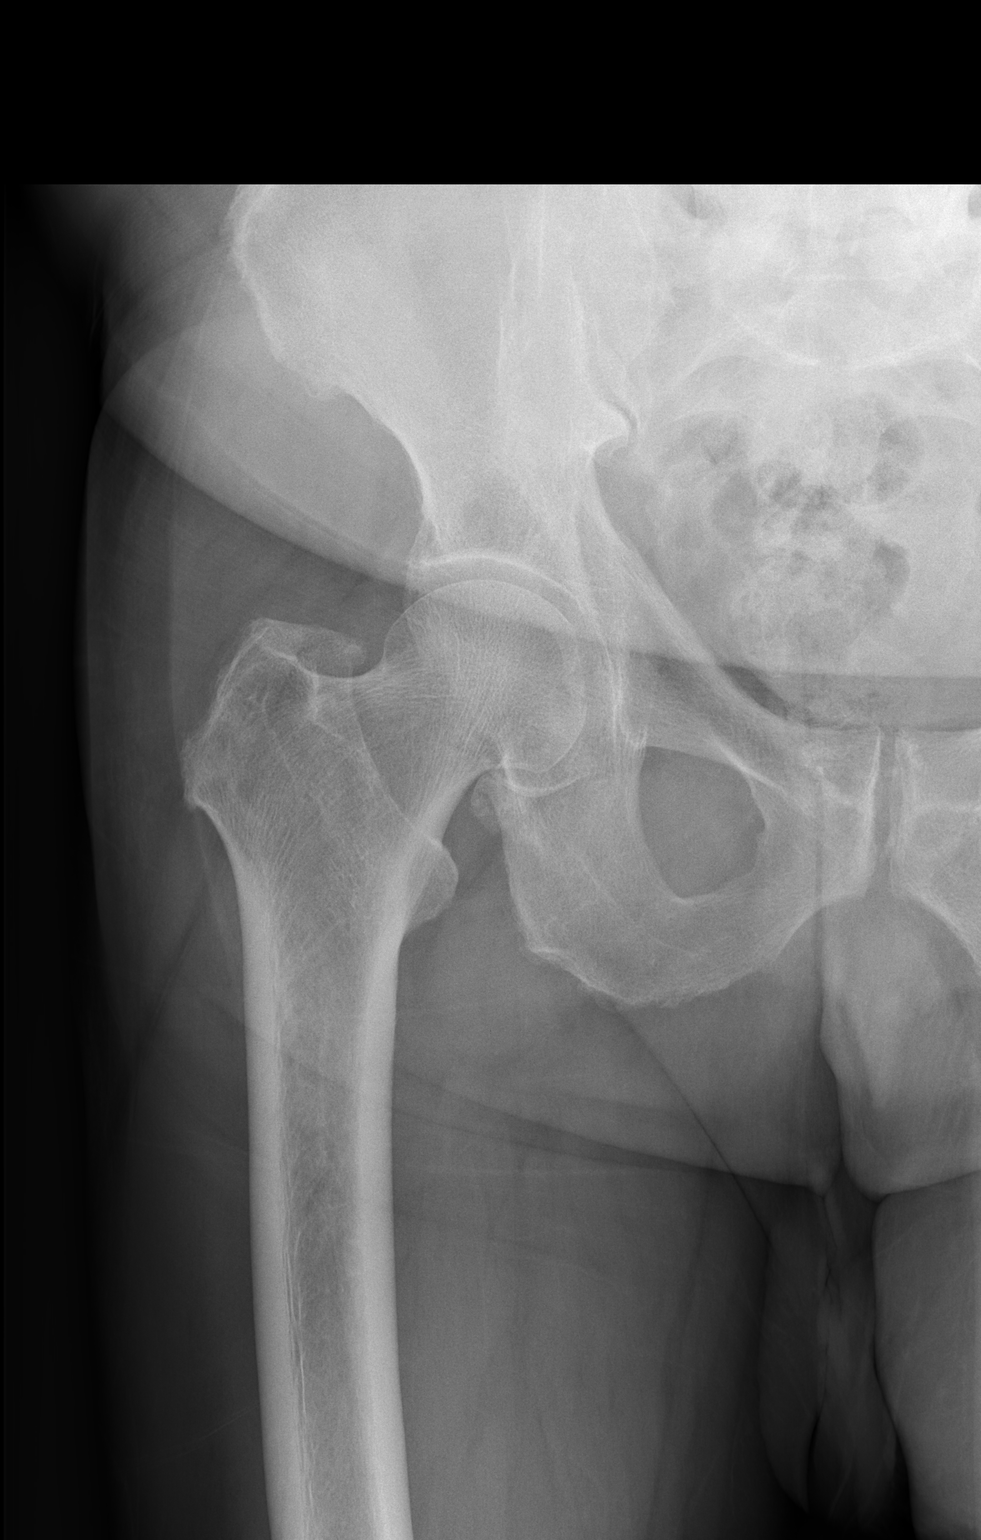

[t hip frog leg right]
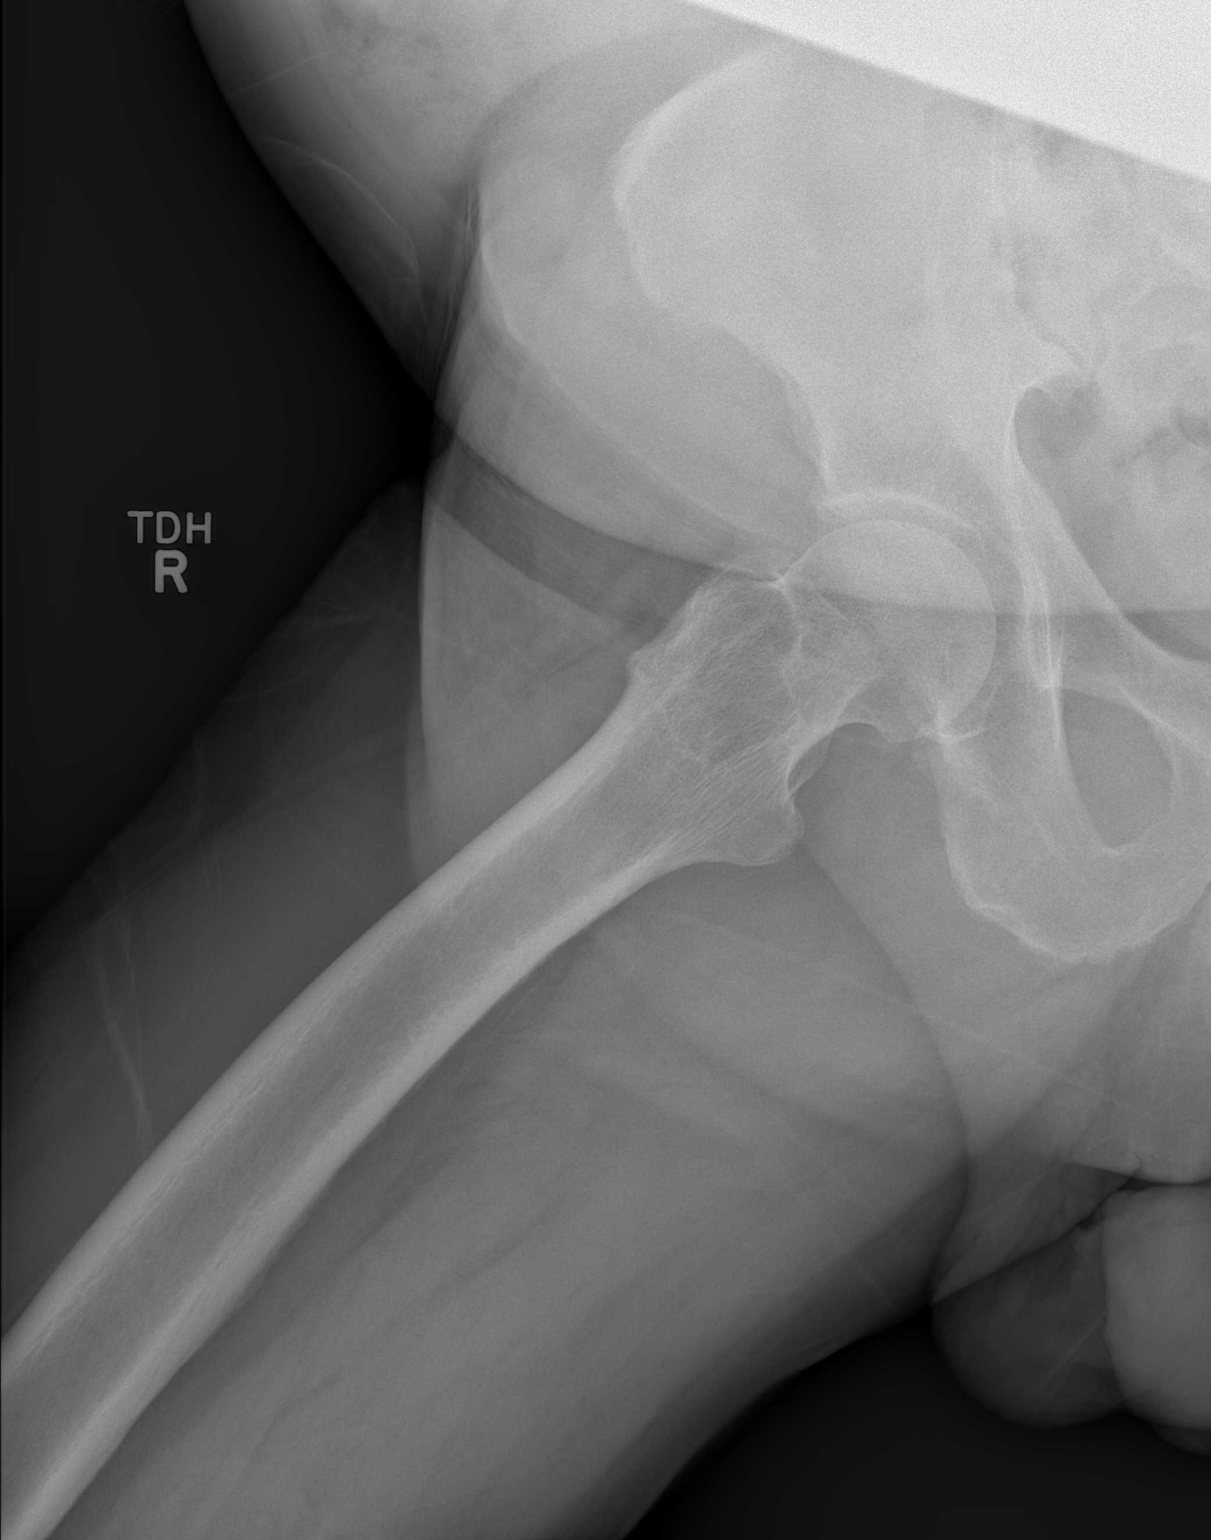

[3 of 3 positions shown; findings below may reference images not displayed]

FINDINGS: Three views of the bony pelvis and right hip demonstrate no definite
acute displaced fracture of the bony pelvic ring. Visualized
portions of the proximal femurs are intact bilaterally, the right
femoral head is properly located. Mild degenerative changes of
osteoarthritis are noted in the hip joints bilaterally.
IMPRESSION: 1. No acute radiographic abnormality of the bony pelvis or the right
hip.

## 2014-05-28 NOTE — Telephone Encounter (Signed)
CLOSE ENCOUNTER °

## 2014-05-30 ENCOUNTER — Encounter (HOSPITAL_COMMUNITY): Payer: Self-pay | Admitting: Nurse Practitioner

## 2014-05-30 ENCOUNTER — Emergency Department (HOSPITAL_COMMUNITY)
Admission: EM | Admit: 2014-05-30 | Discharge: 2014-05-31 | Disposition: A | Discharge status: Home / Self Care | Payer: Medicare Other | Attending: Emergency Medicine | Admitting: Emergency Medicine

## 2014-05-30 DIAGNOSIS — I5032 Chronic diastolic (congestive) heart failure: Secondary | ICD-10-CM | POA: Diagnosis not present

## 2014-05-30 DIAGNOSIS — I2510 Atherosclerotic heart disease of native coronary artery without angina pectoris: Secondary | ICD-10-CM | POA: Insufficient documentation

## 2014-05-30 DIAGNOSIS — I4892 Unspecified atrial flutter: Secondary | ICD-10-CM | POA: Diagnosis not present

## 2014-05-30 DIAGNOSIS — Z794 Long term (current) use of insulin: Secondary | ICD-10-CM | POA: Insufficient documentation

## 2014-05-30 DIAGNOSIS — M1990 Unspecified osteoarthritis, unspecified site: Secondary | ICD-10-CM | POA: Diagnosis not present

## 2014-05-30 DIAGNOSIS — Z8719 Personal history of other diseases of the digestive system: Secondary | ICD-10-CM | POA: Diagnosis not present

## 2014-05-30 DIAGNOSIS — J449 Chronic obstructive pulmonary disease, unspecified: Secondary | ICD-10-CM | POA: Insufficient documentation

## 2014-05-30 DIAGNOSIS — Z7982 Long term (current) use of aspirin: Secondary | ICD-10-CM | POA: Diagnosis not present

## 2014-05-30 DIAGNOSIS — R51 Headache: Secondary | ICD-10-CM | POA: Insufficient documentation

## 2014-05-30 DIAGNOSIS — Z720 Tobacco use: Secondary | ICD-10-CM | POA: Diagnosis not present

## 2014-05-30 DIAGNOSIS — E119 Type 2 diabetes mellitus without complications: Secondary | ICD-10-CM | POA: Insufficient documentation

## 2014-05-30 DIAGNOSIS — D649 Anemia, unspecified: Secondary | ICD-10-CM | POA: Diagnosis not present

## 2014-05-30 DIAGNOSIS — R519 Headache, unspecified: Secondary | ICD-10-CM

## 2014-05-30 DIAGNOSIS — Z8701 Personal history of pneumonia (recurrent): Secondary | ICD-10-CM | POA: Diagnosis not present

## 2014-05-30 DIAGNOSIS — I10 Essential (primary) hypertension: Secondary | ICD-10-CM | POA: Insufficient documentation

## 2014-05-30 DIAGNOSIS — Z7901 Long term (current) use of anticoagulants: Secondary | ICD-10-CM | POA: Insufficient documentation

## 2014-05-30 DIAGNOSIS — E785 Hyperlipidemia, unspecified: Secondary | ICD-10-CM | POA: Insufficient documentation

## 2014-05-30 DIAGNOSIS — Z79899 Other long term (current) drug therapy: Secondary | ICD-10-CM | POA: Diagnosis not present

## 2014-05-30 MED ORDER — ACETAMINOPHEN 500 MG PO TABS
1000.0000 mg | ORAL_TABLET | Freq: Once | ORAL | Status: DC
Start: 2014-05-30 — End: 2014-05-31
  Filled 2014-05-30: qty 2

## 2014-05-30 NOTE — ED Notes (Signed)
Olivia Mackie in main lab will add on blood work to draw and hold specimen

## 2014-05-30 NOTE — ED Notes (Signed)
Pt states he cannot take tylenol per Dr. [name] his cardiologist.

## 2014-05-30 NOTE — ED Notes (Signed)
Pt remains monitored by blood pressure and pulse ox.  
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
## 2014-05-30 NOTE — ED Notes (Signed)
He c/o posterior and R sided headache since Thursday. He denies ear pain, vision changes, n/v. He has tried nothing for the pain. He states nothing makes the pain better or worse. He is a&ox4, breathing easily. He denies head injury

## 2014-05-31 ENCOUNTER — Emergency Department (HOSPITAL_COMMUNITY): Payer: Medicare Other

## 2014-05-31 LAB — BASIC METABOLIC PANEL
Anion gap: 4 — ABNORMAL LOW (ref 5–15)
BUN: 12 mg/dL (ref 6–23)
CO2: 29 mmol/L (ref 19–32)
Calcium: 9 mg/dL (ref 8.4–10.5)
Chloride: 105 mmol/L (ref 96–112)
Creatinine, Ser: 0.82 mg/dL (ref 0.50–1.35)
GFR calc non Af Amer: 84 mL/min — ABNORMAL LOW (ref >90)
GLUCOSE: 136 mg/dL — AB (ref 70–99)
Potassium: 3.9 mmol/L (ref 3.5–5.1)
Sodium: 138 mmol/L (ref 135–145)

## 2014-05-31 LAB — CBC WITH DIFFERENTIAL/PLATELET
BASOS ABS: 0.1 10*3/uL (ref 0.0–0.1)
Basophils Relative: 1 % (ref 0–1)
EOS ABS: 0.3 10*3/uL (ref 0.0–0.7)
EOS PCT: 4 % (ref 0–5)
HCT: 37.1 % — ABNORMAL LOW (ref 39.0–52.0)
Hemoglobin: 12 g/dL — ABNORMAL LOW (ref 13.0–17.0)
Lymphocytes Relative: 38 % (ref 12–46)
Lymphs Abs: 2.7 10*3/uL (ref 0.7–4.0)
MCH: 28.6 pg (ref 26.0–34.0)
MCHC: 32.3 g/dL (ref 30.0–36.0)
MCV: 88.3 fL (ref 78.0–100.0)
Monocytes Absolute: 0.5 10*3/uL (ref 0.1–1.0)
Monocytes Relative: 6 % (ref 3–12)
NEUTROS ABS: 3.7 10*3/uL (ref 1.7–7.7)
Neutrophils Relative %: 51 % (ref 43–77)
Platelets: 190 10*3/uL (ref 150–400)
RBC: 4.2 MIL/uL — AB (ref 4.22–5.81)
RDW: 14.4 % (ref 11.5–15.5)
WBC: 7.1 10*3/uL (ref 4.0–10.5)

## 2014-05-31 LAB — PROTIME-INR
INR: 2.2 — ABNORMAL HIGH (ref 0.00–1.49)
Prothrombin Time: 24.6 seconds — ABNORMAL HIGH (ref 11.6–15.2)

## 2014-05-31 MED ORDER — HYDROMORPHONE HCL 1 MG/ML IJ SOLN
0.5000 mg | Freq: Once | INTRAMUSCULAR | Status: AC
  Administered 2014-05-31: 0.5 mg via INTRAMUSCULAR
  Filled 2014-05-31: qty 1

## 2014-05-31 NOTE — Discharge Instructions (Signed)
Please be sure to follow-up with your primary care physician in 2 days to arrange appropriate ongoing care of your headache, and other medical issues.  For the next 2 days, please use ice packs, ibuprofen, other medication as needed for resolution of your symptoms.  Return here for concerning changes in your condition.

## 2014-06-12 ENCOUNTER — Ambulatory Visit: Payer: Medicare Other | Admitting: Pharmacist Clinician (PhC)/ Clinical Pharmacy Specialist

## 2014-06-19 ENCOUNTER — Ambulatory Visit (INDEPENDENT_AMBULATORY_CARE_PROVIDER_SITE_OTHER): Payer: Medicare Other | Admitting: Pharmacist Clinician (PhC)/ Clinical Pharmacy Specialist

## 2014-06-19 DIAGNOSIS — Z7901 Long term (current) use of anticoagulants: Secondary | ICD-10-CM | POA: Diagnosis not present

## 2014-06-19 DIAGNOSIS — I480 Paroxysmal atrial fibrillation: Secondary | ICD-10-CM

## 2014-06-19 LAB — POCT INR: INR: 1.7

## 2014-06-23 ENCOUNTER — Other Ambulatory Visit: Payer: Self-pay | Admitting: Cardiology

## 2014-06-23 NOTE — Telephone Encounter (Signed)
Rx(s) sent to pharmacy electronically.  

## 2014-06-27 IMAGING — CR DG CHEST 2V
2 series · 2 of 2 positions shown · non-contrast
Comparison: 01/24/2013

CLINICAL DATA: Cough, congestion and weakness.

EXAM:
CHEST  2 VIEW

[w chest pa]
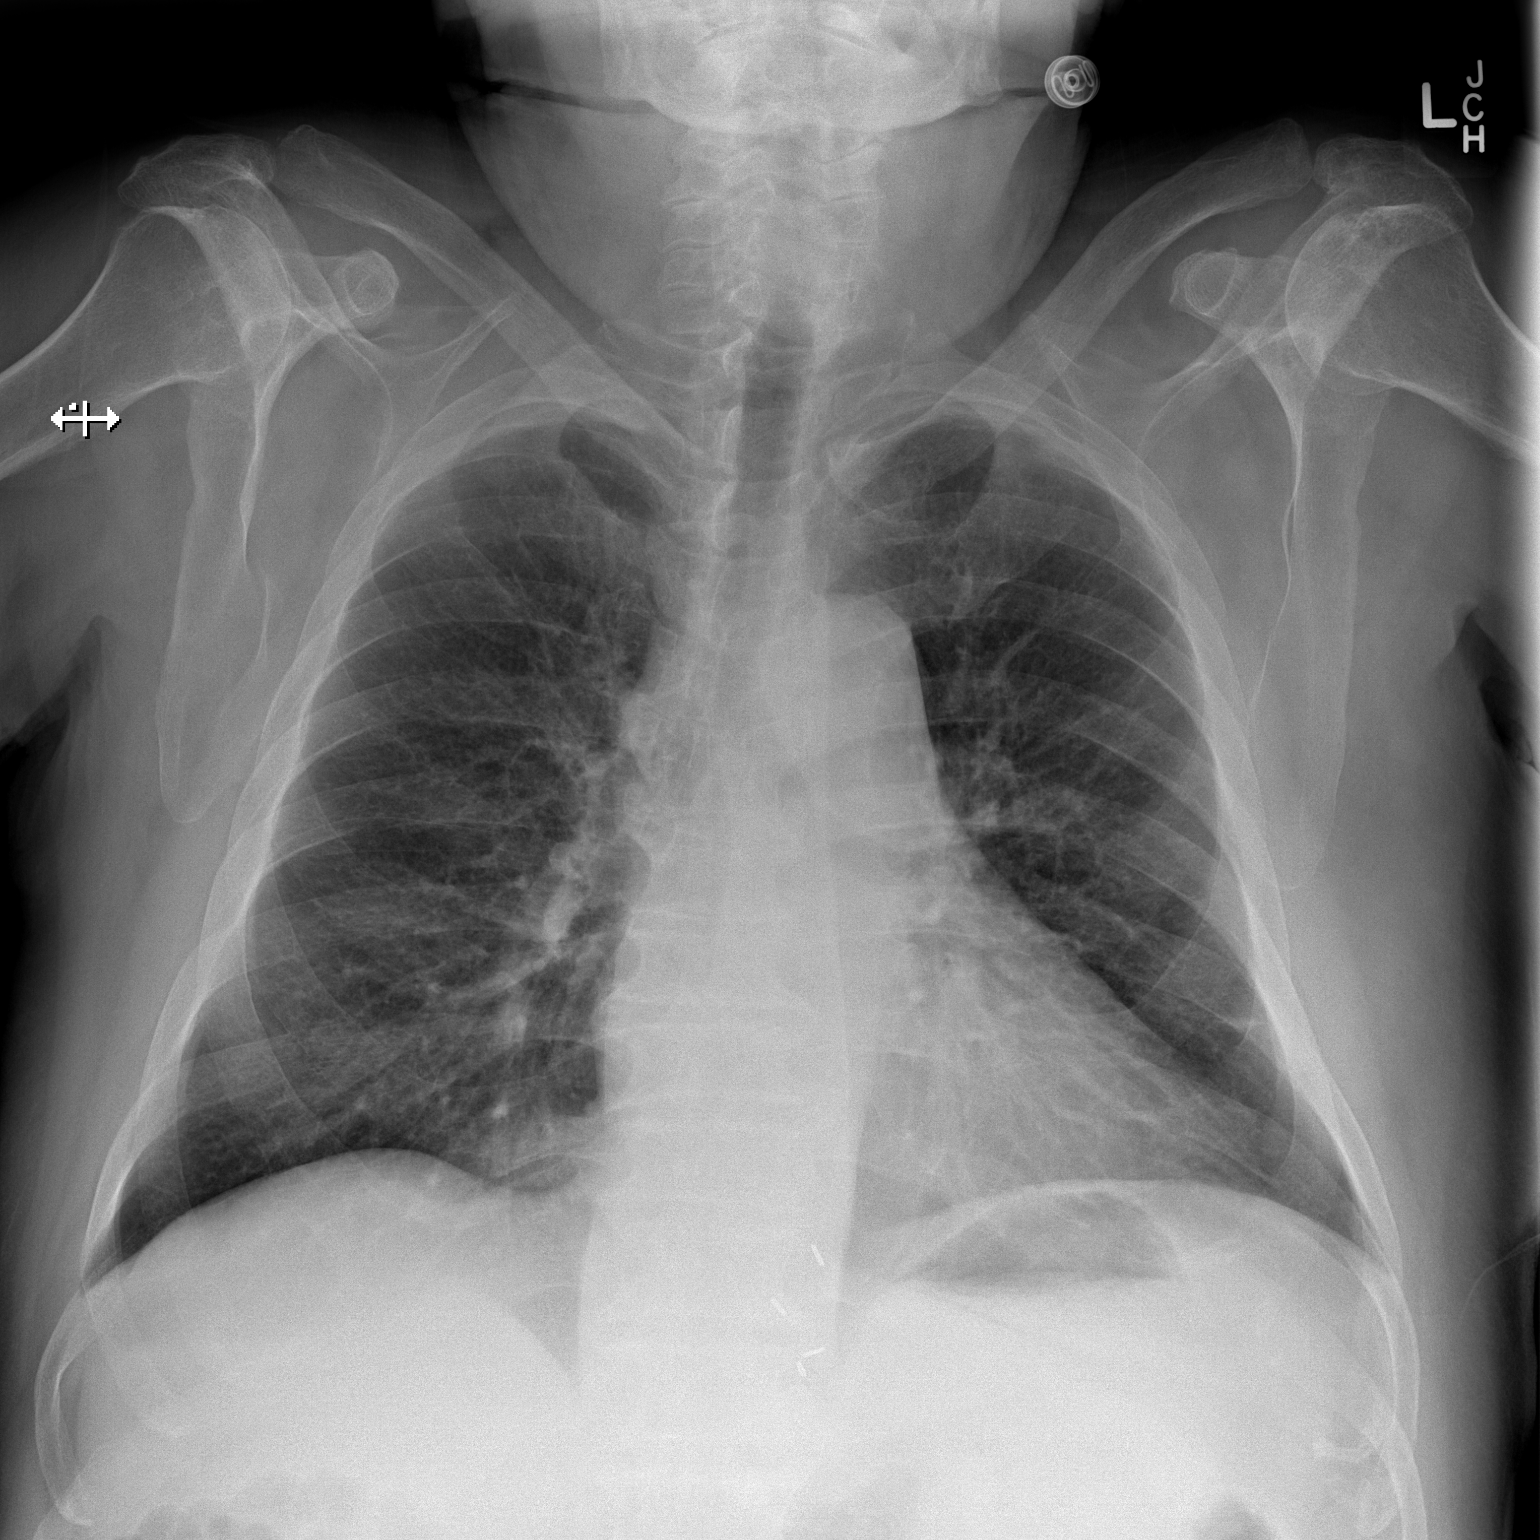

[w chest lat]
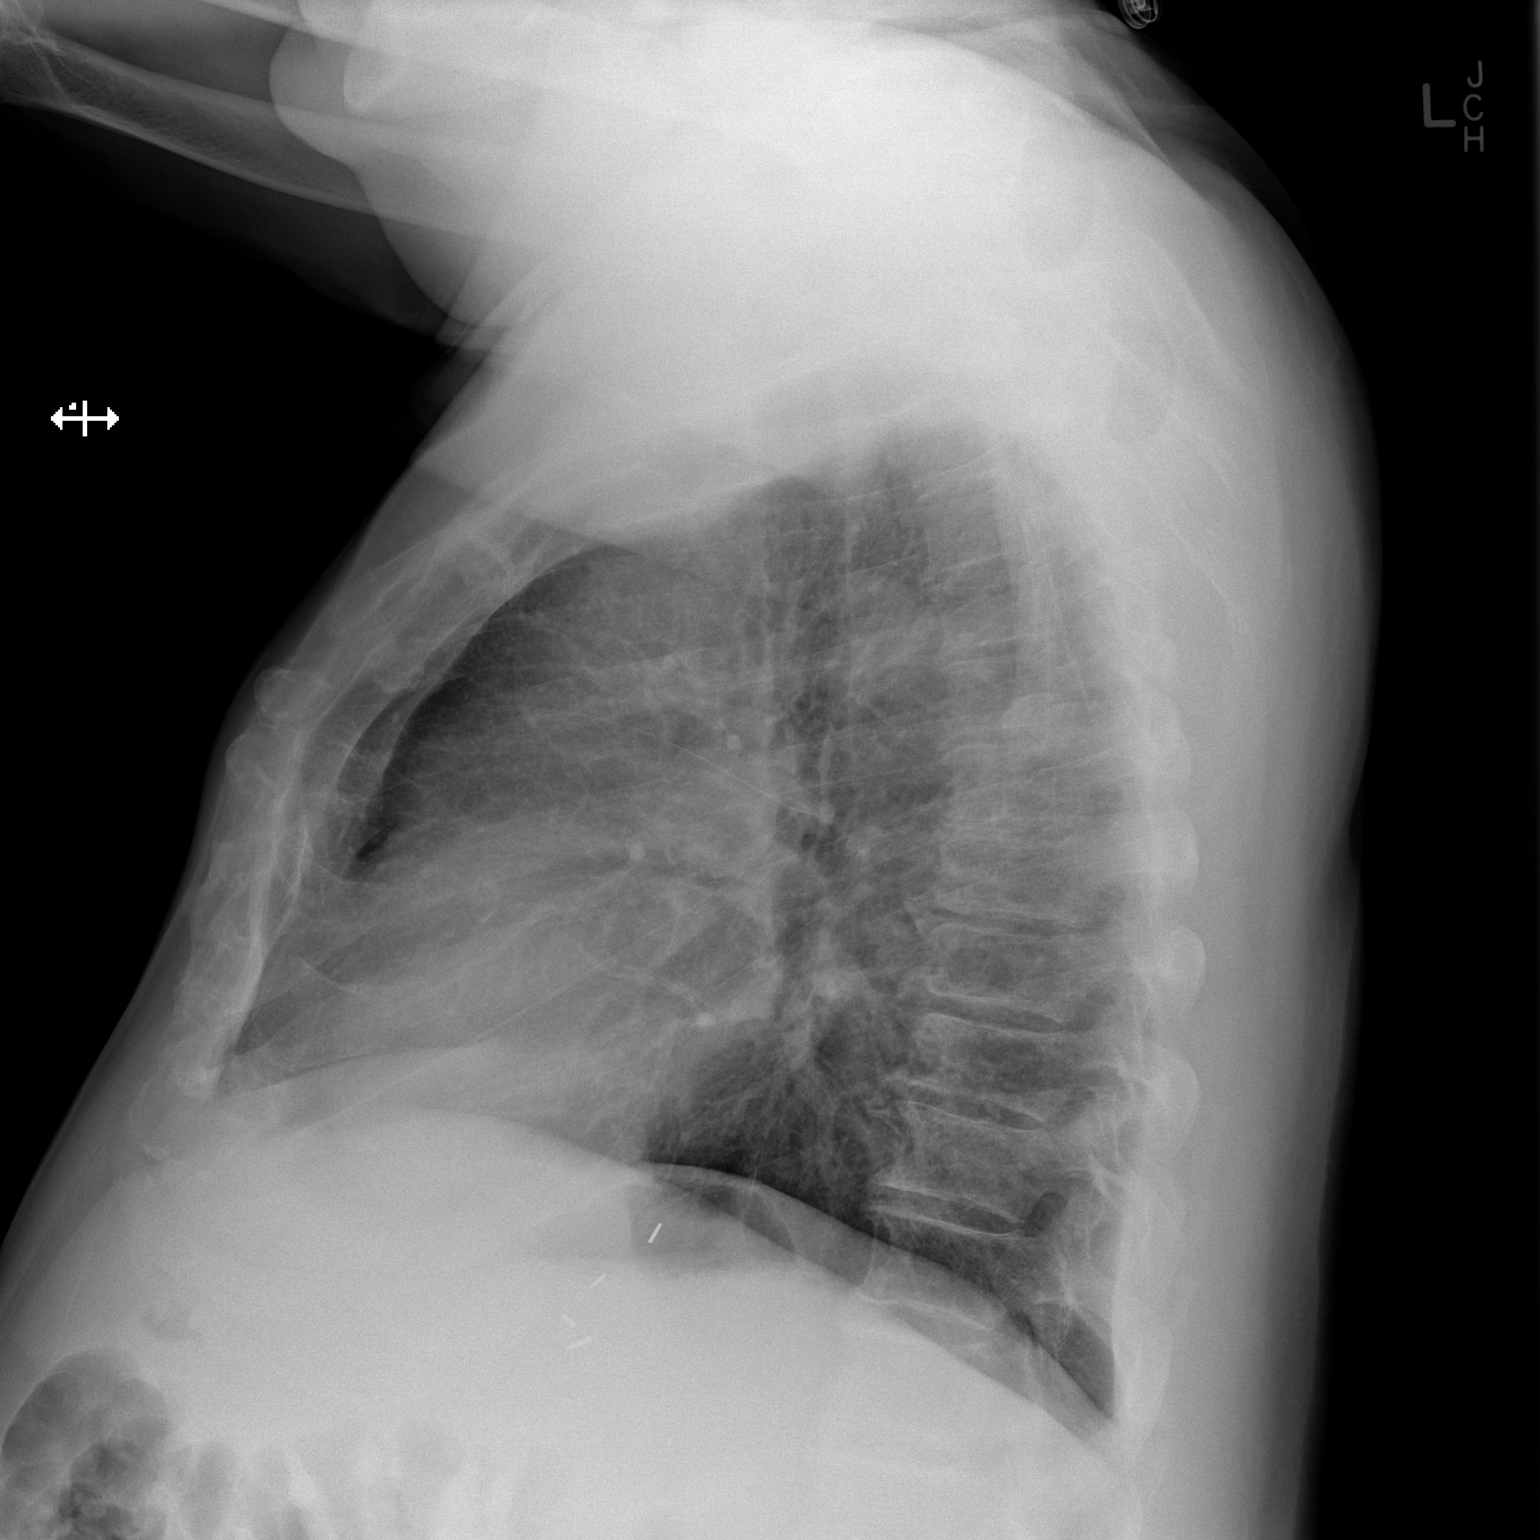

[2 of 2 positions shown; findings below may reference images not displayed]

FINDINGS: Cardiac silhouette is normal in size. Normal mediastinal and hilar
contours. The lungs are mildly hyperexpanded. The lungs are clear.
No pleural effusion or pneumothorax.

The bony thorax is demineralized but intact.
IMPRESSION: No acute cardiopulmonary disease.

## 2014-07-03 ENCOUNTER — Ambulatory Visit (INDEPENDENT_AMBULATORY_CARE_PROVIDER_SITE_OTHER): Payer: Medicare Other | Admitting: Pharmacist Clinician (PhC)/ Clinical Pharmacy Specialist

## 2014-07-03 DIAGNOSIS — I4891 Unspecified atrial fibrillation: Secondary | ICD-10-CM

## 2014-07-03 DIAGNOSIS — Z7901 Long term (current) use of anticoagulants: Secondary | ICD-10-CM | POA: Diagnosis not present

## 2014-07-03 DIAGNOSIS — I4892 Unspecified atrial flutter: Secondary | ICD-10-CM | POA: Diagnosis not present

## 2014-07-03 LAB — POCT INR: INR: 1.8

## 2014-07-06 ENCOUNTER — Telehealth: Payer: Self-pay | Admitting: Cardiology

## 2014-07-06 ENCOUNTER — Emergency Department (HOSPITAL_COMMUNITY)
Admission: EM | Admit: 2014-07-06 | Discharge: 2014-07-07 | Disposition: A | Discharge status: Home / Self Care | Payer: Medicare Other | Attending: Emergency Medicine | Admitting: Emergency Medicine

## 2014-07-06 ENCOUNTER — Encounter (HOSPITAL_COMMUNITY): Payer: Self-pay | Admitting: Emergency Medicine

## 2014-07-06 ENCOUNTER — Encounter: Payer: Self-pay | Admitting: Cardiology

## 2014-07-06 ENCOUNTER — Emergency Department (HOSPITAL_COMMUNITY): Payer: Medicare Other

## 2014-07-06 DIAGNOSIS — Z720 Tobacco use: Secondary | ICD-10-CM | POA: Diagnosis not present

## 2014-07-06 DIAGNOSIS — M1990 Unspecified osteoarthritis, unspecified site: Secondary | ICD-10-CM | POA: Diagnosis not present

## 2014-07-06 DIAGNOSIS — I480 Paroxysmal atrial fibrillation: Secondary | ICD-10-CM | POA: Diagnosis not present

## 2014-07-06 DIAGNOSIS — Z8719 Personal history of other diseases of the digestive system: Secondary | ICD-10-CM | POA: Diagnosis not present

## 2014-07-06 DIAGNOSIS — I10 Essential (primary) hypertension: Secondary | ICD-10-CM | POA: Insufficient documentation

## 2014-07-06 DIAGNOSIS — I2510 Atherosclerotic heart disease of native coronary artery without angina pectoris: Secondary | ICD-10-CM | POA: Insufficient documentation

## 2014-07-06 DIAGNOSIS — Z9889 Other specified postprocedural states: Secondary | ICD-10-CM | POA: Diagnosis not present

## 2014-07-06 DIAGNOSIS — Z7901 Long term (current) use of anticoagulants: Secondary | ICD-10-CM | POA: Insufficient documentation

## 2014-07-06 DIAGNOSIS — R05 Cough: Secondary | ICD-10-CM | POA: Diagnosis present

## 2014-07-06 DIAGNOSIS — J449 Chronic obstructive pulmonary disease, unspecified: Secondary | ICD-10-CM | POA: Insufficient documentation

## 2014-07-06 DIAGNOSIS — Z79899 Other long term (current) drug therapy: Secondary | ICD-10-CM | POA: Diagnosis not present

## 2014-07-06 DIAGNOSIS — Z794 Long term (current) use of insulin: Secondary | ICD-10-CM | POA: Insufficient documentation

## 2014-07-06 DIAGNOSIS — D649 Anemia, unspecified: Secondary | ICD-10-CM | POA: Diagnosis not present

## 2014-07-06 DIAGNOSIS — I5032 Chronic diastolic (congestive) heart failure: Secondary | ICD-10-CM | POA: Diagnosis not present

## 2014-07-06 DIAGNOSIS — E119 Type 2 diabetes mellitus without complications: Secondary | ICD-10-CM | POA: Diagnosis not present

## 2014-07-06 DIAGNOSIS — Z7982 Long term (current) use of aspirin: Secondary | ICD-10-CM | POA: Diagnosis not present

## 2014-07-06 DIAGNOSIS — E785 Hyperlipidemia, unspecified: Secondary | ICD-10-CM | POA: Diagnosis not present

## 2014-07-06 DIAGNOSIS — Z8701 Personal history of pneumonia (recurrent): Secondary | ICD-10-CM | POA: Diagnosis not present

## 2014-07-06 DIAGNOSIS — J209 Acute bronchitis, unspecified: Secondary | ICD-10-CM

## 2014-07-06 LAB — CBC WITH DIFFERENTIAL/PLATELET
Basophils Absolute: 0.1 10*3/uL (ref 0.0–0.1)
Basophils Relative: 1 % (ref 0–1)
EOS PCT: 4 % (ref 0–5)
Eosinophils Absolute: 0.3 10*3/uL (ref 0.0–0.7)
HEMATOCRIT: 39 % (ref 39.0–52.0)
Hemoglobin: 12.8 g/dL — ABNORMAL LOW (ref 13.0–17.0)
LYMPHS PCT: 38 % (ref 12–46)
Lymphs Abs: 2.8 10*3/uL (ref 0.7–4.0)
MCH: 28.9 pg (ref 26.0–34.0)
MCHC: 32.8 g/dL (ref 30.0–36.0)
MCV: 88 fL (ref 78.0–100.0)
MONOS PCT: 8 % (ref 3–12)
Monocytes Absolute: 0.6 10*3/uL (ref 0.1–1.0)
Neutro Abs: 3.5 10*3/uL (ref 1.7–7.7)
Neutrophils Relative %: 49 % (ref 43–77)
Platelets: 191 10*3/uL (ref 150–400)
RBC: 4.43 MIL/uL (ref 4.22–5.81)
RDW: 14.5 % (ref 11.5–15.5)
WBC: 7.3 10*3/uL (ref 4.0–10.5)

## 2014-07-06 LAB — BASIC METABOLIC PANEL
Anion gap: 12 (ref 5–15)
BUN: 11 mg/dL (ref 6–23)
CALCIUM: 9.1 mg/dL (ref 8.4–10.5)
CO2: 24 mmol/L (ref 19–32)
CREATININE: 0.96 mg/dL (ref 0.50–1.35)
Chloride: 106 mmol/L (ref 96–112)
GFR calc Af Amer: >90 mL/min (ref >90)
GFR calc non Af Amer: 79 mL/min — ABNORMAL LOW (ref >90)
GLUCOSE: 123 mg/dL — AB (ref 70–99)
Potassium: 3.7 mmol/L (ref 3.5–5.1)
SODIUM: 142 mmol/L (ref 135–145)

## 2014-07-06 MED ORDER — ALBUTEROL SULFATE HFA 108 (90 BASE) MCG/ACT IN AERS
2.0000 | INHALATION_SPRAY | RESPIRATORY_TRACT | Status: DC | PRN
  Administered 2014-07-06: 2 via RESPIRATORY_TRACT
  Filled 2014-07-06: qty 6.7

## 2014-07-06 MED ORDER — LEVOFLOXACIN 500 MG PO TABS: 500.0000 mg | ORAL_TABLET | Freq: Every day | ORAL | Status: DC

## 2014-07-06 MED ORDER — ALBUTEROL SULFATE (2.5 MG/3ML) 0.083% IN NEBU
5.0000 mg | INHALATION_SOLUTION | Freq: Once | RESPIRATORY_TRACT | Status: AC
  Administered 2014-07-06: 5 mg via RESPIRATORY_TRACT
  Filled 2014-07-06: qty 6

## 2014-07-06 MED ORDER — AEROCHAMBER PLUS W/MASK MISC
1.0000 | Freq: Once | Status: AC
  Administered 2014-07-06: 1
  Filled 2014-07-06: qty 1

## 2014-07-06 MED ORDER — PREDNISONE 20 MG PO TABS
60.0000 mg | ORAL_TABLET | Freq: Once | ORAL | Status: AC
  Administered 2014-07-06: 60 mg via ORAL
  Filled 2014-07-06: qty 3

## 2014-07-06 MED ORDER — PREDNISONE 10 MG PO TABS: ORAL_TABLET | ORAL | Status: DC

## 2014-07-06 MED ORDER — IPRATROPIUM BROMIDE 0.02 % IN SOLN
0.5000 mg | Freq: Once | RESPIRATORY_TRACT | Status: AC
  Administered 2014-07-06: 0.5 mg via RESPIRATORY_TRACT
  Filled 2014-07-06: qty 2.5

## 2014-07-06 MED ORDER — LEVOFLOXACIN 750 MG PO TABS
750.0000 mg | ORAL_TABLET | Freq: Once | ORAL | Status: AC
  Administered 2014-07-06: 750 mg via ORAL
  Filled 2014-07-06: qty 1
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
## 2014-07-06 NOTE — ED Notes (Signed)
Pt sts cough and pain with cough; pt sts hx of bronchitis and feels like same

## 2014-07-07 NOTE — Telephone Encounter (Signed)
Close encounter 

## 2014-07-10 ENCOUNTER — Ambulatory Visit: Payer: Medicare Other | Admitting: Cardiology

## 2014-07-17 ENCOUNTER — Ambulatory Visit (INDEPENDENT_AMBULATORY_CARE_PROVIDER_SITE_OTHER): Payer: Medicare Other | Admitting: Pharmacist Clinician (PhC)/ Clinical Pharmacy Specialist

## 2014-07-17 ENCOUNTER — Ambulatory Visit: Payer: Medicare Other | Admitting: Cardiology

## 2014-07-17 ENCOUNTER — Ambulatory Visit: Payer: Medicare Other | Admitting: Pharmacist Clinician (PhC)/ Clinical Pharmacy Specialist

## 2014-07-17 DIAGNOSIS — Z7901 Long term (current) use of anticoagulants: Secondary | ICD-10-CM

## 2014-07-17 DIAGNOSIS — I480 Paroxysmal atrial fibrillation: Secondary | ICD-10-CM | POA: Diagnosis not present

## 2014-07-17 LAB — POCT INR: INR: 2.3

## 2014-07-21 ENCOUNTER — Encounter (HOSPITAL_COMMUNITY): Payer: Self-pay | Admitting: Physical Medicine and Rehabilitation

## 2014-07-21 ENCOUNTER — Emergency Department (HOSPITAL_COMMUNITY)
Admission: EM | Admit: 2014-07-21 | Discharge: 2014-07-21 | Disposition: A | Discharge status: Home / Self Care | Payer: Medicare Other | Attending: Emergency Medicine | Admitting: Emergency Medicine

## 2014-07-21 ENCOUNTER — Emergency Department (HOSPITAL_COMMUNITY): Payer: Medicare Other

## 2014-07-21 DIAGNOSIS — M7981 Nontraumatic hematoma of soft tissue: Secondary | ICD-10-CM | POA: Insufficient documentation

## 2014-07-21 DIAGNOSIS — Z8701 Personal history of pneumonia (recurrent): Secondary | ICD-10-CM | POA: Insufficient documentation

## 2014-07-21 DIAGNOSIS — M79602 Pain in left arm: Secondary | ICD-10-CM | POA: Diagnosis present

## 2014-07-21 DIAGNOSIS — I5032 Chronic diastolic (congestive) heart failure: Secondary | ICD-10-CM | POA: Diagnosis not present

## 2014-07-21 DIAGNOSIS — I2510 Atherosclerotic heart disease of native coronary artery without angina pectoris: Secondary | ICD-10-CM | POA: Diagnosis not present

## 2014-07-21 DIAGNOSIS — Z9889 Other specified postprocedural states: Secondary | ICD-10-CM | POA: Diagnosis not present

## 2014-07-21 DIAGNOSIS — Z794 Long term (current) use of insulin: Secondary | ICD-10-CM | POA: Insufficient documentation

## 2014-07-21 DIAGNOSIS — E785 Hyperlipidemia, unspecified: Secondary | ICD-10-CM | POA: Diagnosis not present

## 2014-07-21 DIAGNOSIS — Z720 Tobacco use: Secondary | ICD-10-CM | POA: Diagnosis not present

## 2014-07-21 DIAGNOSIS — I10 Essential (primary) hypertension: Secondary | ICD-10-CM | POA: Insufficient documentation

## 2014-07-21 DIAGNOSIS — M1990 Unspecified osteoarthritis, unspecified site: Secondary | ICD-10-CM | POA: Insufficient documentation

## 2014-07-21 DIAGNOSIS — Z7901 Long term (current) use of anticoagulants: Secondary | ICD-10-CM | POA: Diagnosis not present

## 2014-07-21 DIAGNOSIS — I4891 Unspecified atrial fibrillation: Secondary | ICD-10-CM | POA: Insufficient documentation

## 2014-07-21 DIAGNOSIS — Z79899 Other long term (current) drug therapy: Secondary | ICD-10-CM | POA: Insufficient documentation

## 2014-07-21 DIAGNOSIS — T148XXA Other injury of unspecified body region, initial encounter: Secondary | ICD-10-CM

## 2014-07-21 DIAGNOSIS — Z7982 Long term (current) use of aspirin: Secondary | ICD-10-CM | POA: Insufficient documentation

## 2014-07-21 DIAGNOSIS — E119 Type 2 diabetes mellitus without complications: Secondary | ICD-10-CM | POA: Diagnosis not present

## 2014-07-21 DIAGNOSIS — R200 Anesthesia of skin: Secondary | ICD-10-CM | POA: Insufficient documentation

## 2014-07-21 DIAGNOSIS — D649 Anemia, unspecified: Secondary | ICD-10-CM | POA: Insufficient documentation

## 2014-07-21 DIAGNOSIS — J449 Chronic obstructive pulmonary disease, unspecified: Secondary | ICD-10-CM | POA: Diagnosis not present

## 2014-07-21 LAB — PROTIME-INR
INR: 2.17 — AB (ref 0.00–1.49)
Prothrombin Time: 24.3 seconds — ABNORMAL HIGH (ref 11.6–15.2)

## 2014-07-21 LAB — APTT: aPTT: 42 seconds — ABNORMAL HIGH (ref 24–37)

## 2014-07-21 NOTE — ED Notes (Signed)
Pt reports bruising to L am. Also states numbness and pain to L hand and arm. Currently taking Coumadin. Pt denies recent injury. He is alert and oriented x4.
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
## 2014-07-21 NOTE — Discharge Instructions (Signed)
Contusion °A contusion is a deep bruise. Contusions are the result of an injury that caused bleeding under the skin. The contusion may turn blue, purple, or yellow. Minor injuries will give you a painless contusion, but more severe contusions may stay painful and swollen for a few weeks.  °CAUSES  °A contusion is usually caused by a blow, trauma, or direct force to an area of the body. °SYMPTOMS  °· Swelling and redness of the injured area. °· Bruising of the injured area. °· Tenderness and soreness of the injured area. °· Pain. °DIAGNOSIS  °The diagnosis can be made by taking a history and physical exam. An X-ray, CT scan, or MRI may be needed to determine if there were any associated injuries, such as fractures. °TREATMENT  °Specific treatment will depend on what area of the body was injured. In general, the best treatment for a contusion is resting, icing, elevating, and applying cold compresses to the injured area. Over-the-counter medicines may also be recommended for pain control. Ask your caregiver what the best treatment is for your contusion. °HOME CARE INSTRUCTIONS  °· Put ice on the injured area. °¨ Put ice in a plastic bag. °¨ Place a towel between your skin and the bag. °¨ Leave the ice on for 15-20 minutes, 3-4 times a day, or as directed by your health care provider. °· Only take over-the-counter or prescription medicines for pain, discomfort, or fever as directed by your caregiver. Your caregiver may recommend avoiding anti-inflammatory medicines (aspirin, ibuprofen, and naproxen) for 48 hours because these medicines may increase bruising. °· Rest the injured area. °· If possible, elevate the injured area to reduce swelling. °SEEK IMMEDIATE MEDICAL CARE IF:  °· You have increased bruising or swelling. °· You have pain that is getting worse. °· Your swelling or pain is not relieved with medicines. °MAKE SURE YOU:  °· Understand these instructions. °· Will watch your condition. °· Will get help right  away if you are not doing well or get worse. °Document Released: 12/21/2004 Document Revised: 03/18/2013 Document Reviewed: 01/16/2011 °[employer]® Patient Information ©2015 [employer]. This information is not intended to replace advice given to you by your health care provider. Make sure you discuss any questions you have with your health care provider. ° °

## 2014-07-22 ENCOUNTER — Encounter (HOSPITAL_COMMUNITY): Payer: Self-pay | Admitting: Emergency Medicine

## 2014-07-22 ENCOUNTER — Emergency Department (HOSPITAL_COMMUNITY): Payer: Medicare Other

## 2014-07-22 ENCOUNTER — Emergency Department (HOSPITAL_COMMUNITY)
Admission: EM | Admit: 2014-07-22 | Discharge: 2014-07-23 | Disposition: A | Discharge status: Home / Self Care | Payer: Medicare Other | Attending: Emergency Medicine | Admitting: Emergency Medicine

## 2014-07-22 DIAGNOSIS — Z79899 Other long term (current) drug therapy: Secondary | ICD-10-CM | POA: Insufficient documentation

## 2014-07-22 DIAGNOSIS — Z8719 Personal history of other diseases of the digestive system: Secondary | ICD-10-CM | POA: Insufficient documentation

## 2014-07-22 DIAGNOSIS — I4892 Unspecified atrial flutter: Secondary | ICD-10-CM | POA: Insufficient documentation

## 2014-07-22 DIAGNOSIS — Z7901 Long term (current) use of anticoagulants: Secondary | ICD-10-CM | POA: Diagnosis not present

## 2014-07-22 DIAGNOSIS — E119 Type 2 diabetes mellitus without complications: Secondary | ICD-10-CM | POA: Diagnosis not present

## 2014-07-22 DIAGNOSIS — Z7982 Long term (current) use of aspirin: Secondary | ICD-10-CM | POA: Diagnosis not present

## 2014-07-22 DIAGNOSIS — I5032 Chronic diastolic (congestive) heart failure: Secondary | ICD-10-CM | POA: Diagnosis not present

## 2014-07-22 DIAGNOSIS — E785 Hyperlipidemia, unspecified: Secondary | ICD-10-CM | POA: Insufficient documentation

## 2014-07-22 DIAGNOSIS — I499 Cardiac arrhythmia, unspecified: Secondary | ICD-10-CM | POA: Diagnosis not present

## 2014-07-22 DIAGNOSIS — M1990 Unspecified osteoarthritis, unspecified site: Secondary | ICD-10-CM | POA: Diagnosis not present

## 2014-07-22 DIAGNOSIS — J449 Chronic obstructive pulmonary disease, unspecified: Secondary | ICD-10-CM | POA: Diagnosis not present

## 2014-07-22 DIAGNOSIS — D649 Anemia, unspecified: Secondary | ICD-10-CM | POA: Insufficient documentation

## 2014-07-22 DIAGNOSIS — I2510 Atherosclerotic heart disease of native coronary artery without angina pectoris: Secondary | ICD-10-CM | POA: Diagnosis not present

## 2014-07-22 DIAGNOSIS — Z8701 Personal history of pneumonia (recurrent): Secondary | ICD-10-CM | POA: Insufficient documentation

## 2014-07-22 DIAGNOSIS — Z720 Tobacco use: Secondary | ICD-10-CM | POA: Insufficient documentation

## 2014-07-22 DIAGNOSIS — Z794 Long term (current) use of insulin: Secondary | ICD-10-CM | POA: Insufficient documentation

## 2014-07-22 DIAGNOSIS — R110 Nausea: Secondary | ICD-10-CM | POA: Insufficient documentation

## 2014-07-22 DIAGNOSIS — R51 Headache: Secondary | ICD-10-CM | POA: Diagnosis not present

## 2014-07-22 DIAGNOSIS — R079 Chest pain, unspecified: Secondary | ICD-10-CM | POA: Insufficient documentation

## 2014-07-22 DIAGNOSIS — I480 Paroxysmal atrial fibrillation: Secondary | ICD-10-CM | POA: Insufficient documentation

## 2014-07-22 DIAGNOSIS — I10 Essential (primary) hypertension: Secondary | ICD-10-CM | POA: Insufficient documentation

## 2014-07-22 LAB — CBC WITH DIFFERENTIAL/PLATELET
Basophils Absolute: 0 10*3/uL (ref 0.0–0.1)
Basophils Relative: 1 % (ref 0–1)
Eosinophils Absolute: 0.2 10*3/uL (ref 0.0–0.7)
Eosinophils Relative: 3 % (ref 0–5)
HCT: 36.4 % — ABNORMAL LOW (ref 39.0–52.0)
HEMOGLOBIN: 11.7 g/dL — AB (ref 13.0–17.0)
LYMPHS ABS: 2.3 10*3/uL (ref 0.7–4.0)
LYMPHS PCT: 29 % (ref 12–46)
MCH: 28.5 pg (ref 26.0–34.0)
MCHC: 32.1 g/dL (ref 30.0–36.0)
MCV: 88.8 fL (ref 78.0–100.0)
Monocytes Absolute: 0.6 10*3/uL (ref 0.1–1.0)
Monocytes Relative: 7 % (ref 3–12)
NEUTROS PCT: 60 % (ref 43–77)
Neutro Abs: 4.9 10*3/uL (ref 1.7–7.7)
Platelets: 200 10*3/uL (ref 150–400)
RBC: 4.1 MIL/uL — ABNORMAL LOW (ref 4.22–5.81)
RDW: 14.4 % (ref 11.5–15.5)
WBC: 8.1 10*3/uL (ref 4.0–10.5)

## 2014-07-22 LAB — PROTIME-INR
INR: 2.2 — ABNORMAL HIGH (ref 0.00–1.49)
PROTHROMBIN TIME: 24.6 s — AB (ref 11.6–15.2)

## 2014-07-22 NOTE — ED Notes (Signed)
Left sided chest pain since noon today relieved by NTG sl x 2 in ambulance.  Pt took 7 low dose aspirin prior to EMS arrival.  Currently pain free, just c/o muscle pain in left chest.
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
## 2014-07-23 LAB — BASIC METABOLIC PANEL
Anion gap: 10 (ref 5–15)
BUN: 12 mg/dL (ref 6–23)
CALCIUM: 9.4 mg/dL (ref 8.4–10.5)
CO2: 27 mmol/L (ref 19–32)
CREATININE: 1.03 mg/dL (ref 0.50–1.35)
Chloride: 104 mmol/L (ref 96–112)
GFR calc Af Amer: 80 mL/min — ABNORMAL LOW (ref >90)
GFR calc non Af Amer: 69 mL/min — ABNORMAL LOW (ref >90)
GLUCOSE: 94 mg/dL (ref 70–99)
Potassium: 4.1 mmol/L (ref 3.5–5.1)
Sodium: 141 mmol/L (ref 135–145)

## 2014-07-23 LAB — TROPONIN I: Troponin I: <0.03 ng/mL (ref <0.031)

## 2014-07-23 NOTE — Discharge Instructions (Signed)

## 2014-08-05 ENCOUNTER — Encounter (HOSPITAL_COMMUNITY): Payer: Self-pay | Admitting: *Deleted

## 2014-08-05 ENCOUNTER — Emergency Department (HOSPITAL_COMMUNITY)
Admission: EM | Admit: 2014-08-05 | Discharge: 2014-08-05 | Disposition: A | Discharge status: Home / Self Care | Payer: Medicare Other | Attending: Emergency Medicine | Admitting: Emergency Medicine

## 2014-08-05 DIAGNOSIS — I10 Essential (primary) hypertension: Secondary | ICD-10-CM | POA: Diagnosis not present

## 2014-08-05 DIAGNOSIS — Z794 Long term (current) use of insulin: Secondary | ICD-10-CM | POA: Insufficient documentation

## 2014-08-05 DIAGNOSIS — Z8701 Personal history of pneumonia (recurrent): Secondary | ICD-10-CM | POA: Insufficient documentation

## 2014-08-05 DIAGNOSIS — J449 Chronic obstructive pulmonary disease, unspecified: Secondary | ICD-10-CM | POA: Diagnosis not present

## 2014-08-05 DIAGNOSIS — Z7901 Long term (current) use of anticoagulants: Secondary | ICD-10-CM | POA: Diagnosis not present

## 2014-08-05 DIAGNOSIS — Z9889 Other specified postprocedural states: Secondary | ICD-10-CM | POA: Diagnosis not present

## 2014-08-05 DIAGNOSIS — Z79899 Other long term (current) drug therapy: Secondary | ICD-10-CM | POA: Insufficient documentation

## 2014-08-05 DIAGNOSIS — I499 Cardiac arrhythmia, unspecified: Secondary | ICD-10-CM | POA: Insufficient documentation

## 2014-08-05 DIAGNOSIS — E119 Type 2 diabetes mellitus without complications: Secondary | ICD-10-CM | POA: Diagnosis not present

## 2014-08-05 DIAGNOSIS — I2510 Atherosclerotic heart disease of native coronary artery without angina pectoris: Secondary | ICD-10-CM | POA: Diagnosis not present

## 2014-08-05 DIAGNOSIS — E785 Hyperlipidemia, unspecified: Secondary | ICD-10-CM | POA: Diagnosis not present

## 2014-08-05 DIAGNOSIS — Z8719 Personal history of other diseases of the digestive system: Secondary | ICD-10-CM | POA: Diagnosis not present

## 2014-08-05 DIAGNOSIS — Z720 Tobacco use: Secondary | ICD-10-CM | POA: Insufficient documentation

## 2014-08-05 DIAGNOSIS — M25532 Pain in left wrist: Secondary | ICD-10-CM

## 2014-08-05 DIAGNOSIS — I5032 Chronic diastolic (congestive) heart failure: Secondary | ICD-10-CM | POA: Insufficient documentation

## 2014-08-05 DIAGNOSIS — M1990 Unspecified osteoarthritis, unspecified site: Secondary | ICD-10-CM | POA: Diagnosis not present

## 2014-08-05 DIAGNOSIS — I480 Paroxysmal atrial fibrillation: Secondary | ICD-10-CM | POA: Diagnosis not present

## 2014-08-05 DIAGNOSIS — D649 Anemia, unspecified: Secondary | ICD-10-CM | POA: Diagnosis not present

## 2014-08-05 DIAGNOSIS — Z7982 Long term (current) use of aspirin: Secondary | ICD-10-CM | POA: Insufficient documentation

## 2014-08-05 DIAGNOSIS — I4892 Unspecified atrial flutter: Secondary | ICD-10-CM | POA: Diagnosis not present

## 2014-08-05 MED ORDER — TRAMADOL HCL 50 MG PO TABS
50.0000 mg | ORAL_TABLET | Freq: Once | ORAL | Status: AC
  Administered 2014-08-05: 50 mg via ORAL
  Filled 2014-08-05: qty 1

## 2014-08-05 MED ORDER — TRAMADOL HCL 50 MG PO TABS: 50.0000 mg | ORAL_TABLET | Freq: Four times a day (QID) | ORAL | Status: DC | PRN

## 2014-08-05 NOTE — ED Notes (Signed)
The pt has had  Pain in his lt wrist and hand since he had carpal tunnel surgery noivember 2015 he has had the pain since the surgery
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
## 2014-08-05 NOTE — Discharge Instructions (Signed)
Take the prescribed medication as directed. Follow-up with Dr. Erlinda Hong. Return to the ED for new or worsening symptoms.

## 2014-08-07 ENCOUNTER — Emergency Department (HOSPITAL_COMMUNITY): Payer: Medicare Other

## 2014-08-07 ENCOUNTER — Encounter (HOSPITAL_COMMUNITY): Payer: Self-pay | Admitting: *Deleted

## 2014-08-07 ENCOUNTER — Emergency Department (HOSPITAL_COMMUNITY)
Admission: EM | Admit: 2014-08-07 | Discharge: 2014-08-08 | Disposition: A | Discharge status: Home / Self Care | Payer: Medicare Other | Attending: Emergency Medicine | Admitting: Emergency Medicine

## 2014-08-07 DIAGNOSIS — I10 Essential (primary) hypertension: Secondary | ICD-10-CM | POA: Insufficient documentation

## 2014-08-07 DIAGNOSIS — E785 Hyperlipidemia, unspecified: Secondary | ICD-10-CM | POA: Insufficient documentation

## 2014-08-07 DIAGNOSIS — R0789 Other chest pain: Secondary | ICD-10-CM

## 2014-08-07 DIAGNOSIS — R059 Cough, unspecified: Secondary | ICD-10-CM

## 2014-08-07 DIAGNOSIS — I2510 Atherosclerotic heart disease of native coronary artery without angina pectoris: Secondary | ICD-10-CM | POA: Insufficient documentation

## 2014-08-07 DIAGNOSIS — E119 Type 2 diabetes mellitus without complications: Secondary | ICD-10-CM | POA: Insufficient documentation

## 2014-08-07 DIAGNOSIS — R05 Cough: Secondary | ICD-10-CM

## 2014-08-07 DIAGNOSIS — J449 Chronic obstructive pulmonary disease, unspecified: Secondary | ICD-10-CM

## 2014-08-07 DIAGNOSIS — Z7952 Long term (current) use of systemic steroids: Secondary | ICD-10-CM | POA: Insufficient documentation

## 2014-08-07 DIAGNOSIS — Z720 Tobacco use: Secondary | ICD-10-CM | POA: Diagnosis not present

## 2014-08-07 DIAGNOSIS — I5032 Chronic diastolic (congestive) heart failure: Secondary | ICD-10-CM | POA: Insufficient documentation

## 2014-08-07 DIAGNOSIS — Z8701 Personal history of pneumonia (recurrent): Secondary | ICD-10-CM | POA: Insufficient documentation

## 2014-08-07 DIAGNOSIS — M1990 Unspecified osteoarthritis, unspecified site: Secondary | ICD-10-CM | POA: Insufficient documentation

## 2014-08-07 DIAGNOSIS — Z79899 Other long term (current) drug therapy: Secondary | ICD-10-CM | POA: Diagnosis not present

## 2014-08-07 DIAGNOSIS — Z7901 Long term (current) use of anticoagulants: Secondary | ICD-10-CM | POA: Diagnosis not present

## 2014-08-07 DIAGNOSIS — J441 Chronic obstructive pulmonary disease with (acute) exacerbation: Secondary | ICD-10-CM | POA: Diagnosis not present

## 2014-08-07 DIAGNOSIS — I480 Paroxysmal atrial fibrillation: Secondary | ICD-10-CM | POA: Insufficient documentation

## 2014-08-07 DIAGNOSIS — Z7982 Long term (current) use of aspirin: Secondary | ICD-10-CM | POA: Diagnosis not present

## 2014-08-07 DIAGNOSIS — D649 Anemia, unspecified: Secondary | ICD-10-CM | POA: Insufficient documentation

## 2014-08-07 DIAGNOSIS — Z792 Long term (current) use of antibiotics: Secondary | ICD-10-CM | POA: Insufficient documentation

## 2014-08-07 LAB — CBC WITH DIFFERENTIAL/PLATELET
BASOS PCT: 1 % (ref 0–1)
Basophils Absolute: 0.1 10*3/uL (ref 0.0–0.1)
EOS ABS: 0.3 10*3/uL (ref 0.0–0.7)
EOS PCT: 4 % (ref 0–5)
HCT: 33.8 % — ABNORMAL LOW (ref 39.0–52.0)
Hemoglobin: 10.8 g/dL — ABNORMAL LOW (ref 13.0–17.0)
LYMPHS PCT: 35 % (ref 12–46)
Lymphs Abs: 2.8 10*3/uL (ref 0.7–4.0)
MCH: 28.2 pg (ref 26.0–34.0)
MCHC: 32 g/dL (ref 30.0–36.0)
MCV: 88.3 fL (ref 78.0–100.0)
MONO ABS: 0.8 10*3/uL (ref 0.1–1.0)
Monocytes Relative: 10 % (ref 3–12)
NEUTROS ABS: 4.1 10*3/uL (ref 1.7–7.7)
Neutrophils Relative %: 50 % (ref 43–77)
PLATELETS: 266 10*3/uL (ref 150–400)
RBC: 3.83 MIL/uL — AB (ref 4.22–5.81)
RDW: 14.3 % (ref 11.5–15.5)
WBC: 8 10*3/uL (ref 4.0–10.5)

## 2014-08-07 LAB — I-STAT TROPONIN, ED: Troponin i, poc: 0 ng/mL (ref 0.00–0.08)

## 2014-08-07 MED ORDER — IPRATROPIUM-ALBUTEROL 0.5-2.5 (3) MG/3ML IN SOLN
3.0000 mL | Freq: Once | RESPIRATORY_TRACT | Status: AC
  Administered 2014-08-07: 3 mL via RESPIRATORY_TRACT
  Filled 2014-08-07: qty 3

## 2014-08-07 MED ORDER — HYDROCODONE-ACETAMINOPHEN 5-325 MG PO TABS
2.0000 | ORAL_TABLET | Freq: Once | ORAL | Status: AC
Start: 2014-08-07 — End: 2014-08-07
  Administered 2014-08-07: 2 via ORAL
  Filled 2014-08-07: qty 2

## 2014-08-07 MED ORDER — PREDNISONE 20 MG PO TABS
60.0000 mg | ORAL_TABLET | Freq: Once | ORAL | Status: AC
  Administered 2014-08-07: 60 mg via ORAL
  Filled 2014-08-07: qty 3

## 2014-08-07 NOTE — ED Notes (Signed)
Pt. Was seen a couple of weeks ago for bronchitis and received antibiotics and prednisone. Pt. Finished treatment and now comes in because of generalized body aches and a persistent cough

## 2014-08-07 NOTE — ED Provider Notes (Signed)
TIME SEEN: 11:00 PM  CHIEF COMPLAINT: Cough  HPI: Pt is a 74 y.o. male with history of a flutter, diastolic heart failure, CAD, COPD on oxygen, hypertension, diabetes, hyperlipidemia who presents to the emergency department with cough for the past 2 weeks with gray sputum production. Reports he is having pain in his sides with coughing.  Denies chest pain or shortness of breath states he has not been on antibiotics or steroids in several weeks. States he had body aches without fever. He states he has never been told he has COPD but is a current smoker. States this feels like his chronic bronchitis.  ROS: See HPI Constitutional: no fever  Eyes: no drainage  ENT: no runny nose   Cardiovascular:  no chest pain  Resp: no SOB  GI: no vomiting GU: no dysuria Integumentary: no rash  Allergy: no hives  Musculoskeletal: no leg swelling  Neurological: no slurred speech ROS otherwise negative  PAST MEDICAL HISTORY/PAST SURGICAL HISTORY:  Past Medical History  Diagnosis Date  . Atrial flutter     a. 07/2010 Status post caval tricuspid isthmus ablation by Dr. Midge Aver Metoprolol daily  . PAF (paroxysmal atrial fibrillation)     a. Recurrent after atrial flutter, currently controlled on flecainide plus diltiazem  . Hyperlipidemia     takes Pravastatin daily  . Diastolic CHF, chronic     a. 12/2012 EF 55-60%, diast dysfxn, triv MR, mildly dil LA/RA.  [name] Coronary artery disease, non-occlusive     a. 03/2010 Nonocclusive disease by cath, performed for ST elevations on ECG;  b. 06/2013 Lexi MV: EF 60%, no ischemia.  [name] COPD (chronic obstructive pulmonary disease)   . Cervical radiculopathy due to degenerative joint disease of spine   . Claudication     a. lower ext dopplers 08/18/11-normal ABIs bilaterally with normal triphasic waveforms  . Arthritis   . HTN (hypertension)     takes Diltiazem daily  . Diabetes mellitus type II     takes Metformin and Lantus daily  . Dysrhythmia     HX OF  ATRIAL IFB /FLUTTER takes Flecanide and Coumadin daily  . Pneumonia     several years ago  . History of bronchitis     1998  . Weakness     numbness and tingling both hands  . Joint pain   . History of gastric ulcer   . Anemia     takes Ferrous Sulfate daily  . History of blood transfusion 1982    no abnormal reaction noted  . Balance problem 01/2014    MEDICATIONS:  Prior to Admission medications   Medication Sig Start Date End Date Taking? Authorizing Provider  insulin glargine (LANTUS SOLOSTAR) 100 UNIT/ML injection Inject 8 Units into the skin 2 (two) times daily.    Yes Historical Provider, MD  aspirin EC 81 MG tablet Take 81 mg by mouth daily.    Historical Provider, MD  diltiazem (CARDIZEM CD) 120 MG 24 hr capsule Take 1 capsule (120 mg total) by mouth daily. 04/03/14   Erlene Quan, PA-C  ferrous sulfate 325 (65 FE) MG tablet Take 325 mg by mouth daily with breakfast.    Historical Provider, MD  flecainide (TAMBOCOR) 150 MG tablet Take 1.5 tablets (225 mg total) by mouth 2 (two) times daily. 06/23/14   Leonie Man, MD  hydrocortisone cream 1 % Apply 1 application topically 2 (two) times daily as needed for itching (rash).     Historical Provider, MD  levofloxacin [facility])  500 MG tablet Take 1 tablet (500 mg total) by mouth daily. 07/06/14   Daleen Bo, MD  loratadine (CLARITIN) 10 MG tablet Take 10 mg by mouth 2 (two) times daily.     Historical Provider, MD  metFORMIN (GLUCOPHAGE) 1000 MG tablet Take 1,000 mg by mouth 2 (two) times daily with a meal.     Historical Provider, MD  metoprolol succinate (TOPROL XL) 25 MG 24 hr tablet Take 0.5 tablets (12.5 mg total) by mouth daily. 05/22/14   Leonie Man, MD  metoprolol succinate (TOPROL-XL) 50 MG 24 hr tablet Take 25 mg by mouth at bedtime. Take with or immediately following a meal.    Historical Provider, MD  Multiple Vitamin (MULTIVITAMIN WITH MINERALS) TABS tablet Take 1 tablet by mouth daily.    Historical Provider,  MD  mupirocin ointment (BACTROBAN) 2 % Apply 1 application topically 2 (two) times daily as needed (wound care).  03/26/14   Historical Provider, MD  nitroGLYCERIN (NITROSTAT) 0.4 MG SL tablet Place 1 tablet (0.4 mg total) under the tongue every 5 (five) minutes x 3 doses as needed for chest pain. 05/22/14   Leonie Man, MD  pravastatin (PRAVACHOL) 40 MG tablet Take 40 mg by mouth daily at 6 PM.     Historical Provider, MD  predniSONE (DELTASONE) 10 MG tablet Take q day 6,5,4,3,2,1 Patient not taking: Reported on 07/22/2014 07/06/14   Daleen Bo, MD  tamsulosin (FLOMAX) 0.4 MG CAPS capsule Take 0.4 mg by mouth daily.  05/22/14 05/22/15  Historical Provider, MD  traMADol (ULTRAM) 50 MG tablet Take 1 tablet (50 mg total) by mouth every 6 (six) hours as needed. 08/05/14   Larene Pickett, PA-C  warfarin (COUMADIN) 5 MG tablet Take 7.5-10 mg by mouth daily at 6 PM. Take 2 tablet (10 mg) on Friday, take 1 1/2 tablets (7.5 mg) on Tuesday, Wednesday, Thursday, Saturday, Sunday, Monday    Historical Provider, MD    ALLERGIES:  No Known Allergies  SOCIAL HISTORY:  History  Substance Use Topics  . Smoking status: Current Every Day Smoker -- 0.50 packs/day for 50 years    Types: Cigarettes  . Smokeless tobacco: Never Used     Comment: He's been smoking between 0.5 and 1 ppd since age 22.  [name] Alcohol Use: No    FAMILY HISTORY: Family History  Problem Relation Age of Onset  . Cancer Mother   . Heart attack Father   . Heart attack Brother     EXAM: BP 148/70 mmHg  Pulse 98  Temp(Src) 97.5 F (36.4 C) (Oral)  Resp 16  SpO2 97% CONSTITUTIONAL: Alert and oriented and responds appropriately to questions. Well-appearing; well-nourished HEAD: Normocephalic EYES: Conjunctivae clear, PERRL ENT: normal nose; no rhinorrhea; moist mucous membranes; pharynx without lesions noted NECK: Supple, no meningismus, no LAD  CARD: RRR; S1 and S2 appreciated; no murmurs, no clicks, no rubs, no gallops RESP:  Normal chest excursion without splinting or tachypnea; breath sounds equal bilaterally but he is slightly diminishes spaces with some mild ex prior wheezing, no rhonchi or rales no hypoxia or respiratory distress, speaking full sentences CHEST:  Tender to palpation over the lateral chest wall bilaterally without crepitus or ecchymosis or deformity ABD/GI: Normal bowel sounds; non-distended; soft, non-tender, no rebound, no guarding, no peritoneal signs BACK:  The back appears normal and is non-tender to palpation, there is no CVA tenderness EXT: Normal ROM in all joints; non-tender to palpation; no edema; normal capillary refill; no cyanosis, no  calf tenderness or swelling    SKIN: Normal color for age and race; warm NEURO: Moves all extremities equally, sensation to light touch intact diffusely, cranial nerves II through XII intact PSYCH: The patient's mood and manner are appropriate. Grooming and personal hygiene are appropriate.  MEDICAL DECISION MAKING: Patient here with likely COPD exacerbation that is mild. No hypoxia rest for distress. He is very well-appearing, nontoxic. Hemodynamically stable. Will obtain labs, troponin, chest x-ray, EKG. We'll give DuoNeb, prednisone. We'll give Vicodin for his chest wall pain. Patient would prefer discharge home if workup is unremarkable.  ED PROGRESS: Patient reports feeling much better. Workup in the ED has been unremarkable including negative troponin and normal BNP. Chest x-ray clear. Suspect this is his COPD. We'll discharge with prednisone burst and given albuterol inhaler in the ED. Have advised him to fall post primary care provider. Will also discharge with Vicodin for chest wall pain. Discussed return cautions. He verbalizes understanding is comfortable with plan.    EKG Interpretation  Date/Time:  Friday Aug 07 2014 23:40:09 EDT Ventricular Rate:  80 PR Interval:  315 QRS Duration: 99 QT Interval:  398 QTC Calculation: 459 R  Axis:   -41 Text Interpretation:  Sinus rhythm Prolonged PR interval Left axis deviation No significant change since last tracing Confirmed by Khaylee Mcevoy,  DO, Lashun Ramseyer [phone]) on 08/07/2014 11:42:18 PM        [place], DO 08/08/14 1540

## 2014-08-08 LAB — BASIC METABOLIC PANEL
Anion gap: 11 (ref 5–15)
BUN: 17 mg/dL (ref 6–20)
CO2: 24 mmol/L (ref 22–32)
Calcium: 9 mg/dL (ref 8.9–10.3)
Chloride: 105 mmol/L (ref 101–111)
Creatinine, Ser: 1.13 mg/dL (ref 0.61–1.24)
GFR calc Af Amer: >60 mL/min (ref >60)
GFR calc non Af Amer: >60 mL/min (ref >60)
Glucose, Bld: 98 mg/dL (ref 65–99)
Potassium: 4 mmol/L (ref 3.5–5.1)
Sodium: 140 mmol/L (ref 135–145)

## 2014-08-08 LAB — BRAIN NATRIURETIC PEPTIDE: B NATRIURETIC PEPTIDE 5: 47.8 pg/mL (ref 0.0–100.0)

## 2014-08-08 MED ORDER — PREDNISONE 20 MG PO TABS: 60.0000 mg | ORAL_TABLET | Freq: Every day | ORAL | Status: DC

## 2014-08-08 MED ORDER — HYDROCODONE-ACETAMINOPHEN 5-325 MG PO TABS: 1.0000 | ORAL_TABLET | ORAL | Status: DC | PRN

## 2014-08-08 MED ORDER — ALBUTEROL SULFATE HFA 108 (90 BASE) MCG/ACT IN AERS
2.0000 | INHALATION_SPRAY | Freq: Once | RESPIRATORY_TRACT | Status: AC
  Administered 2014-08-08: 2 via RESPIRATORY_TRACT
  Filled 2014-08-08: qty 6.7

## 2014-08-08 NOTE — Discharge Instructions (Signed)
Chronic Obstructive Pulmonary Disease  Chronic obstructive pulmonary disease (COPD) is a common lung condition in which airflow from the lungs is limited. COPD is a general term that can be used to describe many different lung problems that limit airflow, including both chronic bronchitis and emphysema. If you have COPD, your lung function will probably never return to normal, but there are measures you can take to improve lung function and make yourself feel better.   CAUSES    Smoking (common).    Exposure to secondhand smoke.    Genetic problems.   Chronic inflammatory lung diseases or recurrent infections.  SYMPTOMS    Shortness of breath, especially with physical activity.    Deep, persistent (chronic) cough with a large amount of thick mucus.    Wheezing.    Rapid breaths (tachypnea).    Gray or bluish discoloration (cyanosis) of the skin, especially in fingers, toes, or lips.    Fatigue.    Weight loss.    Frequent infections or episodes when breathing symptoms become much worse (exacerbations).    Chest tightness.  DIAGNOSIS   Your health care provider will take a medical history and perform a physical examination to make the initial diagnosis. Additional tests for COPD may include:    Lung (pulmonary) function tests.   Chest X-ray.   CT scan.   Blood tests.  TREATMENT   Treatment available to help you feel better when you have COPD includes:    Inhaler and nebulizer medicines. These help manage the symptoms of COPD and make your breathing more comfortable.   Supplemental oxygen. Supplemental oxygen is only helpful if you have a low oxygen level in your blood.    Exercise and physical activity. These are beneficial for nearly all people with COPD. Some people may also benefit from a pulmonary rehabilitation program.  HOME CARE INSTRUCTIONS    Take all medicines (inhaled or pills) as directed by your health care provider.   Avoid over-the-counter medicines or cough syrups  that dry up your airway (such as antihistamines) and slow down the elimination of secretions unless instructed otherwise by your health care provider.    If you are a smoker, the most important thing that you can do is stop smoking. Continuing to smoke will cause further lung damage and breathing trouble. Ask your health care provider for help with quitting smoking. He or she can direct you to community resources or hospitals that provide support.   Avoid exposure to irritants such as smoke, chemicals, and fumes that aggravate your breathing.   Use oxygen therapy and pulmonary rehabilitation if directed by your health care provider. If you require home oxygen therapy, ask your health care provider whether you should purchase a pulse oximeter to measure your oxygen level at home.    Avoid contact with individuals who have a contagious illness.   Avoid extreme temperature and humidity changes.   Eat healthy foods. Eating smaller, more frequent meals and resting before meals may help you maintain your strength.   Stay active, but balance activity with periods of rest. Exercise and physical activity will help you maintain your ability to do things you want to do.   Preventing infection and hospitalization is very important when you have COPD. Make sure to receive all the vaccines your health care provider recommends, especially the pneumococcal and influenza vaccines. Ask your health care provider whether you need a pneumonia vaccine.   Learn and use relaxation techniques to manage stress.   Learn   going to whistle and breathe out (exhale) through the pursed lips for 2 seconds.   Diaphragmatic breathing. Start by putting one hand on your abdomen just above  your waist. Inhale slowly through your nose. The hand on your abdomen should move out. Then purse your lips and exhale slowly. You should be able to feel the hand on your abdomen moving in as you exhale.   Learn and use controlled coughing to clear mucus from your lungs. Controlled coughing is a series of short, progressive coughs. The steps of controlled coughing are:  1. Lean your head slightly forward.  2. Breathe in deeply using diaphragmatic breathing.  3. Try to hold your breath for 3 seconds.  4. Keep your mouth slightly open while coughing twice.  5. Spit any mucus out into a tissue.  6. Rest and repeat the steps once or twice as needed. SEEK MEDICAL CARE IF:   You are coughing up more mucus than usual.   There is a change in the color or thickness of your mucus.   Your breathing is more labored than usual.   Your breathing is faster than usual.  SEEK IMMEDIATE MEDICAL CARE IF:   You have shortness of breath while you are resting.   You have shortness of breath that prevents you from:  Being able to talk.   Performing your usual physical activities.   You have chest pain lasting longer than 5 minutes.   Your skin color is more cyanotic than usual.  You measure low oxygen saturations for longer than 5 minutes with a pulse oximeter. MAKE SURE YOU:   Understand these instructions.  Will watch your condition.  Will get help right away if you are not doing well or get worse. Document Released: 12/21/2004 Document Revised: 07/28/2013 Document Reviewed: 11/07/2012 [facility] Patient Information 2015 [place], [place]. This information is not intended to replace advice given to you by your health care provider. Make sure you discuss any questions you have with your health care provider.   Chest Wall Pain Chest wall pain is pain in or around the bones and muscles of your chest. It may take up to 6 weeks to get better. It may take longer if you must stay  physically active in your work and activities.  CAUSES  Chest wall pain may happen on its own. However, it may be caused by:  A viral illness like the flu.  Injury.  Coughing.  Exercise.  Arthritis.  Fibromyalgia.  Shingles. HOME CARE INSTRUCTIONS   Avoid overtiring physical activity. Try not to strain or perform activities that cause pain. This includes any activities using your chest or your abdominal and side muscles, especially if heavy weights are used.  Put ice on the sore area.  Put ice in a plastic bag.  Place a towel between your skin and the bag.  Leave the ice on for 15-20 minutes per hour while awake for the first 2 days.  Only take over-the-counter or prescription medicines for pain, discomfort, or fever as directed by your caregiver. SEEK IMMEDIATE MEDICAL CARE IF:   Your pain increases, or you are very uncomfortable.  You have a fever.  Your chest pain becomes worse.  You have new, unexplained symptoms.  You have nausea or vomiting.  You feel sweaty or lightheaded.  You have a cough with phlegm (sputum), or you cough up blood. MAKE SURE YOU:   Understand these instructions.  Will watch your condition.  Will get help right away if you  are not doing well or get worse. Document Released: 03/13/2005 Document Revised: 06/05/2011 Document Reviewed: 11/07/2010 [facility] Patient Information 2015 [place], [place]. This information is not intended to replace advice given to you by your health care provider. Make sure you discuss any questions you have with your health care provider.

## 2014-08-08 NOTE — ED Notes (Signed)
Pt. Left with all belongings and refused wheelchair 

## 2014-08-14 ENCOUNTER — Ambulatory Visit (INDEPENDENT_AMBULATORY_CARE_PROVIDER_SITE_OTHER): Payer: Medicare Other | Admitting: Pharmacist Clinician (PhC)/ Clinical Pharmacy Specialist

## 2014-08-14 DIAGNOSIS — I480 Paroxysmal atrial fibrillation: Secondary | ICD-10-CM | POA: Diagnosis not present

## 2014-08-14 DIAGNOSIS — Z7901 Long term (current) use of anticoagulants: Secondary | ICD-10-CM | POA: Diagnosis not present

## 2014-08-14 LAB — POCT INR: INR: 2.8

## 2014-08-20 ENCOUNTER — Emergency Department (HOSPITAL_COMMUNITY): Payer: Medicare Other

## 2014-08-20 ENCOUNTER — Encounter (HOSPITAL_COMMUNITY): Payer: Self-pay | Admitting: Emergency Medicine

## 2014-08-20 DIAGNOSIS — R140 Abdominal distension (gaseous): Secondary | ICD-10-CM | POA: Insufficient documentation

## 2014-08-20 DIAGNOSIS — Z8701 Personal history of pneumonia (recurrent): Secondary | ICD-10-CM | POA: Insufficient documentation

## 2014-08-20 DIAGNOSIS — J449 Chronic obstructive pulmonary disease, unspecified: Secondary | ICD-10-CM | POA: Insufficient documentation

## 2014-08-20 DIAGNOSIS — I2510 Atherosclerotic heart disease of native coronary artery without angina pectoris: Secondary | ICD-10-CM | POA: Diagnosis not present

## 2014-08-20 DIAGNOSIS — D649 Anemia, unspecified: Secondary | ICD-10-CM | POA: Insufficient documentation

## 2014-08-20 DIAGNOSIS — Z9889 Other specified postprocedural states: Secondary | ICD-10-CM | POA: Insufficient documentation

## 2014-08-20 DIAGNOSIS — Z7952 Long term (current) use of systemic steroids: Secondary | ICD-10-CM | POA: Insufficient documentation

## 2014-08-20 DIAGNOSIS — Z794 Long term (current) use of insulin: Secondary | ICD-10-CM | POA: Diagnosis not present

## 2014-08-20 DIAGNOSIS — Z8719 Personal history of other diseases of the digestive system: Secondary | ICD-10-CM | POA: Insufficient documentation

## 2014-08-20 DIAGNOSIS — R0789 Other chest pain: Secondary | ICD-10-CM | POA: Diagnosis not present

## 2014-08-20 DIAGNOSIS — R1013 Epigastric pain: Secondary | ICD-10-CM | POA: Diagnosis not present

## 2014-08-20 DIAGNOSIS — E119 Type 2 diabetes mellitus without complications: Secondary | ICD-10-CM | POA: Insufficient documentation

## 2014-08-20 DIAGNOSIS — Z9049 Acquired absence of other specified parts of digestive tract: Secondary | ICD-10-CM | POA: Diagnosis not present

## 2014-08-20 DIAGNOSIS — Z7901 Long term (current) use of anticoagulants: Secondary | ICD-10-CM | POA: Diagnosis not present

## 2014-08-20 DIAGNOSIS — E785 Hyperlipidemia, unspecified: Secondary | ICD-10-CM | POA: Insufficient documentation

## 2014-08-20 DIAGNOSIS — I5032 Chronic diastolic (congestive) heart failure: Secondary | ICD-10-CM | POA: Diagnosis not present

## 2014-08-20 DIAGNOSIS — I480 Paroxysmal atrial fibrillation: Secondary | ICD-10-CM | POA: Diagnosis not present

## 2014-08-20 DIAGNOSIS — Z7982 Long term (current) use of aspirin: Secondary | ICD-10-CM | POA: Insufficient documentation

## 2014-08-20 DIAGNOSIS — R079 Chest pain, unspecified: Secondary | ICD-10-CM | POA: Diagnosis present

## 2014-08-20 DIAGNOSIS — M1990 Unspecified osteoarthritis, unspecified site: Secondary | ICD-10-CM | POA: Diagnosis not present

## 2014-08-20 DIAGNOSIS — I10 Essential (primary) hypertension: Secondary | ICD-10-CM | POA: Diagnosis not present

## 2014-08-20 LAB — CBC
HCT: 33.6 % — ABNORMAL LOW (ref 39.0–52.0)
Hemoglobin: 10.5 g/dL — ABNORMAL LOW (ref 13.0–17.0)
MCH: 27.6 pg (ref 26.0–34.0)
MCHC: 31.3 g/dL (ref 30.0–36.0)
MCV: 88.2 fL (ref 78.0–100.0)
PLATELETS: 253 10*3/uL (ref 150–400)
RBC: 3.81 MIL/uL — ABNORMAL LOW (ref 4.22–5.81)
RDW: 14.3 % (ref 11.5–15.5)
WBC: 10.2 10*3/uL (ref 4.0–10.5)

## 2014-08-20 LAB — BASIC METABOLIC PANEL
Anion gap: 17 — ABNORMAL HIGH (ref 5–15)
BUN: 14 mg/dL (ref 6–20)
CHLORIDE: 103 mmol/L (ref 101–111)
CO2: 18 mmol/L — AB (ref 22–32)
Calcium: 8.7 mg/dL — ABNORMAL LOW (ref 8.9–10.3)
Creatinine, Ser: 1.14 mg/dL (ref 0.61–1.24)
Glucose, Bld: 164 mg/dL — ABNORMAL HIGH (ref 65–99)
POTASSIUM: 4.2 mmol/L (ref 3.5–5.1)
Sodium: 138 mmol/L (ref 135–145)

## 2014-08-20 LAB — I-STAT TROPONIN, ED: Troponin i, poc: 0.01 ng/mL (ref 0.00–0.08)

## 2014-08-20 NOTE — ED Notes (Signed)
Patient with chest pain and abdominal pain since the weekend.  Patient is stating that it is mid epigastric area and lower abdominal pain.  No nausea or vomiting.  No shortness of breath.  He states that he is dehydrated and feels fatigued.

## 2014-08-21 ENCOUNTER — Emergency Department (HOSPITAL_COMMUNITY)
Admission: EM | Admit: 2014-08-21 | Discharge: 2014-08-21 | Disposition: A | Discharge status: Home / Self Care | Payer: Medicare Other | Attending: Emergency Medicine | Admitting: Emergency Medicine

## 2014-08-21 ENCOUNTER — Emergency Department (HOSPITAL_COMMUNITY): Payer: Medicare Other

## 2014-08-21 ENCOUNTER — Encounter (HOSPITAL_COMMUNITY): Payer: Self-pay

## 2014-08-21 DIAGNOSIS — R109 Unspecified abdominal pain: Secondary | ICD-10-CM

## 2014-08-21 DIAGNOSIS — R0789 Other chest pain: Secondary | ICD-10-CM

## 2014-08-21 LAB — I-STAT TROPONIN, ED: Troponin i, poc: 0 ng/mL (ref 0.00–0.08)

## 2014-08-21 LAB — URINALYSIS, ROUTINE W REFLEX MICROSCOPIC
BILIRUBIN URINE: NEGATIVE
Glucose, UA: NEGATIVE mg/dL
Hgb urine dipstick: NEGATIVE
KETONES UR: NEGATIVE mg/dL
Nitrite: NEGATIVE
PH: 7.5 (ref 5.0–8.0)
PROTEIN: NEGATIVE mg/dL
Specific Gravity, Urine: 1.012 (ref 1.005–1.030)
Urobilinogen, UA: 0.2 mg/dL (ref 0.0–1.0)

## 2014-08-21 LAB — HEPATIC FUNCTION PANEL
ALK PHOS: 60 U/L (ref 38–126)
ALT: 21 U/L (ref 17–63)
AST: 19 U/L (ref 15–41)
Albumin: 3.4 g/dL — ABNORMAL LOW (ref 3.5–5.0)
Total Bilirubin: 0.1 mg/dL — ABNORMAL LOW (ref 0.3–1.2)
Total Protein: 6.3 g/dL — ABNORMAL LOW (ref 6.5–8.1)

## 2014-08-21 LAB — CBG MONITORING, ED: Glucose-Capillary: 128 mg/dL — ABNORMAL HIGH (ref 65–99)

## 2014-08-21 LAB — URINE MICROSCOPIC-ADD ON

## 2014-08-21 LAB — LIPASE, BLOOD: LIPASE: 28 U/L (ref 22–51)

## 2014-08-21 MED ORDER — MORPHINE SULFATE 4 MG/ML IJ SOLN
4.0000 mg | Freq: Once | INTRAMUSCULAR | Status: AC
  Administered 2014-08-21: 4 mg via INTRAVENOUS
  Filled 2014-08-21: qty 1

## 2014-08-21 MED ORDER — IOHEXOL 300 MG/ML  SOLN
25.0000 mL | Freq: Once | INTRAMUSCULAR | Status: AC | PRN
Start: 2014-08-21 — End: 2014-08-21
  Administered 2014-08-21: 25 mL via ORAL

## 2014-08-21 MED ORDER — ONDANSETRON HCL 4 MG/2ML IJ SOLN
4.0000 mg | Freq: Once | INTRAMUSCULAR | Status: AC
  Administered 2014-08-21: 4 mg via INTRAVENOUS
  Filled 2014-08-21: qty 2

## 2014-08-21 MED ORDER — IOHEXOL 300 MG/ML  SOLN
100.0000 mL | Freq: Once | INTRAMUSCULAR | Status: AC | PRN
  Administered 2014-08-21: 100 mL via INTRAVENOUS

## 2014-08-21 MED ORDER — SODIUM CHLORIDE 0.9 % IV BOLUS (SEPSIS)
250.0000 mL | Freq: Once | INTRAVENOUS | Status: AC
  Administered 2014-08-21: 250 mL via INTRAVENOUS
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
## 2014-08-21 NOTE — Discharge Instructions (Signed)
Abdominal Pain °Many things can cause abdominal pain. Usually, abdominal pain is not caused by a disease and will improve without treatment. It can often be observed and treated at home. Your health care provider will do a physical exam and possibly order blood tests and X-rays to help determine the seriousness of your pain. However, in many cases, more time must pass before a clear cause of the pain can be found. Before that point, your health care provider may not know if you need more testing or further treatment. °HOME CARE INSTRUCTIONS  °Monitor your abdominal pain for any changes. The following actions may help to alleviate any discomfort you are experiencing: °· Only take over-the-counter or prescription medicines as directed by your health care provider. °· Do not take laxatives unless directed to do so by your health care provider. °· Try a clear liquid diet (broth, tea, or water) as directed by your health care provider. Slowly move to a bland diet as tolerated. °SEEK MEDICAL CARE IF: °· You have unexplained abdominal pain. °· You have abdominal pain associated with nausea or diarrhea. °· You have pain when you urinate or have a bowel movement. °· You experience abdominal pain that wakes you in the night. °· You have abdominal pain that is worsened or improved by eating food. °· You have abdominal pain that is worsened with eating fatty foods. °· You have a fever. °SEEK IMMEDIATE MEDICAL CARE IF:  °· Your pain does not go away within 2 hours. °· You keep throwing up (vomiting). °· Your pain is felt only in portions of the abdomen, such as the right side or the left lower portion of the abdomen. °· You pass bloody or black tarry stools. °MAKE SURE YOU: °· Understand these instructions.   °· Will watch your condition.   °· Will get help right away if you are not doing well or get worse.   °Document Released: 12/21/2004 Document Revised: 03/18/2013 Document Reviewed: 11/20/2012 °[employer]® Patient Information  ©2015 [employer]. This information is not intended to replace advice given to you by your health care provider. Make sure you discuss any questions you have with your health care provider. ° °Chest Pain (Nonspecific) °It is often hard to give a specific diagnosis for the cause of chest pain. There is always a chance that your pain could be related to something serious, such as a heart attack or a blood clot in the lungs. You need to follow up with your health care provider for further evaluation. °CAUSES  °· Heartburn. °· Pneumonia or bronchitis. °· Anxiety or stress. °· Inflammation around your heart (pericarditis) or lung (pleuritis or pleurisy). °· A blood clot in the lung. °· A collapsed lung (pneumothorax). It can develop suddenly on its own (spontaneous pneumothorax) or from trauma to the chest. °· Shingles infection (herpes zoster virus). °The chest wall is composed of bones, muscles, and cartilage. Any of these can be the source of the pain. °· The bones can be bruised by injury. °· The muscles or cartilage can be strained by coughing or overwork. °· The cartilage can be affected by inflammation and become sore (costochondritis). °DIAGNOSIS  °Lab tests or other studies may be needed to find the cause of your pain. Your health care provider may have you take a test called an ambulatory electrocardiogram (ECG). An ECG records your heartbeat patterns over a 24-hour period. You may also have other tests, such as: °· Transthoracic echocardiogram (TTE). During echocardiography, sound waves are used to evaluate how blood   flows through your heart. °· Transesophageal echocardiogram (TEE). °· Cardiac monitoring. This allows your health care provider to monitor your heart rate and rhythm in real time. °· Holter monitor. This is a portable device that records your heartbeat and can help diagnose heart arrhythmias. It allows your health care provider to track your heart activity for several days, if needed. °· Stress  tests by exercise or by giving medicine that makes the heart beat faster. °TREATMENT  °· Treatment depends on what may be causing your chest pain. Treatment may include: °¨ Acid blockers for heartburn. °¨ Anti-inflammatory medicine. °¨ Pain medicine for inflammatory conditions. °¨ Antibiotics if an infection is present. °· You may be advised to change lifestyle habits. This includes stopping smoking and avoiding alcohol, caffeine, and chocolate. °· You may be advised to keep your head raised (elevated) when sleeping. This reduces the chance of acid going backward from your stomach into your esophagus. °Most of the time, nonspecific chest pain will improve within 2-3 days with rest and mild pain medicine.  °HOME CARE INSTRUCTIONS  °· If antibiotics were prescribed, take them as directed. Finish them even if you start to feel better. °· For the next few days, avoid physical activities that bring on chest pain. Continue physical activities as directed. °· Do not use any tobacco products, including cigarettes, chewing tobacco, or electronic cigarettes. °· Avoid drinking alcohol. °· Only take medicine as directed by your health care provider. °· Follow your health care provider's suggestions for further testing if your chest pain does not go away. °· Keep any follow-up appointments you made. If you do not go to an appointment, you could develop lasting (chronic) problems with pain. If there is any problem keeping an appointment, call to reschedule. °SEEK MEDICAL CARE IF:  °· Your chest pain does not go away, even after treatment. °· You have a rash with blisters on your chest. °· You have a fever. °SEEK IMMEDIATE MEDICAL CARE IF:  °· You have increased chest pain or pain that spreads to your arm, neck, jaw, back, or abdomen. °· You have shortness of breath. °· You have an increasing cough, or you cough up blood. °· You have severe back or abdominal pain. °· You feel nauseous or vomit. °· You have severe weakness. °· You  faint. °· You have chills. °This is an emergency. Do not wait to see if the pain will go away. Get medical help at once. Call your local emergency services (911 in [place] Do not drive yourself to the hospital. °MAKE SURE YOU:  °· Understand these instructions. °· Will watch your condition. °· Will get help right away if you are not doing well or get worse. °Document Released: 12/21/2004 Document Revised: 03/18/2013 Document Reviewed: 10/17/2007 °[employer]® Patient Information ©2015 [employer]. This information is not intended to replace advice given to you by your health care provider. Make sure you discuss any questions you have with your health care provider. ° °

## 2014-08-21 NOTE — ED Notes (Signed)
CBG Taken = 128

## 2014-08-27 ENCOUNTER — Emergency Department (HOSPITAL_COMMUNITY): Payer: Medicare Other

## 2014-08-27 ENCOUNTER — Encounter (HOSPITAL_COMMUNITY): Payer: Self-pay | Admitting: Emergency Medicine

## 2014-08-27 ENCOUNTER — Emergency Department (HOSPITAL_COMMUNITY)
Admission: EM | Admit: 2014-08-27 | Discharge: 2014-08-27 | Disposition: A | Discharge status: Home / Self Care | Payer: Medicare Other | Attending: Emergency Medicine | Admitting: Emergency Medicine

## 2014-08-27 DIAGNOSIS — D649 Anemia, unspecified: Secondary | ICD-10-CM | POA: Insufficient documentation

## 2014-08-27 DIAGNOSIS — E119 Type 2 diabetes mellitus without complications: Secondary | ICD-10-CM | POA: Insufficient documentation

## 2014-08-27 DIAGNOSIS — Z79899 Other long term (current) drug therapy: Secondary | ICD-10-CM | POA: Diagnosis not present

## 2014-08-27 DIAGNOSIS — Z8701 Personal history of pneumonia (recurrent): Secondary | ICD-10-CM | POA: Diagnosis not present

## 2014-08-27 DIAGNOSIS — R109 Unspecified abdominal pain: Secondary | ICD-10-CM | POA: Diagnosis not present

## 2014-08-27 DIAGNOSIS — M1990 Unspecified osteoarthritis, unspecified site: Secondary | ICD-10-CM | POA: Diagnosis not present

## 2014-08-27 DIAGNOSIS — Z7952 Long term (current) use of systemic steroids: Secondary | ICD-10-CM | POA: Diagnosis not present

## 2014-08-27 DIAGNOSIS — I5032 Chronic diastolic (congestive) heart failure: Secondary | ICD-10-CM | POA: Diagnosis not present

## 2014-08-27 DIAGNOSIS — I480 Paroxysmal atrial fibrillation: Secondary | ICD-10-CM | POA: Diagnosis not present

## 2014-08-27 DIAGNOSIS — Z9889 Other specified postprocedural states: Secondary | ICD-10-CM | POA: Insufficient documentation

## 2014-08-27 DIAGNOSIS — I10 Essential (primary) hypertension: Secondary | ICD-10-CM | POA: Insufficient documentation

## 2014-08-27 DIAGNOSIS — E785 Hyperlipidemia, unspecified: Secondary | ICD-10-CM | POA: Insufficient documentation

## 2014-08-27 DIAGNOSIS — Z7901 Long term (current) use of anticoagulants: Secondary | ICD-10-CM | POA: Insufficient documentation

## 2014-08-27 DIAGNOSIS — I2510 Atherosclerotic heart disease of native coronary artery without angina pectoris: Secondary | ICD-10-CM | POA: Insufficient documentation

## 2014-08-27 DIAGNOSIS — K5900 Constipation, unspecified: Secondary | ICD-10-CM | POA: Insufficient documentation

## 2014-08-27 DIAGNOSIS — J449 Chronic obstructive pulmonary disease, unspecified: Secondary | ICD-10-CM | POA: Insufficient documentation

## 2014-08-27 DIAGNOSIS — Z7982 Long term (current) use of aspirin: Secondary | ICD-10-CM | POA: Insufficient documentation

## 2014-08-27 DIAGNOSIS — Z720 Tobacco use: Secondary | ICD-10-CM | POA: Insufficient documentation

## 2014-08-27 DIAGNOSIS — M549 Dorsalgia, unspecified: Secondary | ICD-10-CM | POA: Insufficient documentation

## 2014-08-27 DIAGNOSIS — I4892 Unspecified atrial flutter: Secondary | ICD-10-CM | POA: Insufficient documentation

## 2014-08-27 LAB — CBC WITH DIFFERENTIAL/PLATELET
BASOS ABS: 0 10*3/uL (ref 0.0–0.1)
Basophils Relative: 1 % (ref 0–1)
Eosinophils Absolute: 0.2 10*3/uL (ref 0.0–0.7)
Eosinophils Relative: 2 % (ref 0–5)
HEMATOCRIT: 33.3 % — AB (ref 39.0–52.0)
Hemoglobin: 10.3 g/dL — ABNORMAL LOW (ref 13.0–17.0)
LYMPHS ABS: 1.9 10*3/uL (ref 0.7–4.0)
Lymphocytes Relative: 26 % (ref 12–46)
MCH: 27.2 pg (ref 26.0–34.0)
MCHC: 30.9 g/dL (ref 30.0–36.0)
MCV: 87.9 fL (ref 78.0–100.0)
MONOS PCT: 6 % (ref 3–12)
Monocytes Absolute: 0.5 10*3/uL (ref 0.1–1.0)
NEUTROS ABS: 4.6 10*3/uL (ref 1.7–7.7)
Neutrophils Relative %: 65 % (ref 43–77)
PLATELETS: 268 10*3/uL (ref 150–400)
RBC: 3.79 MIL/uL — AB (ref 4.22–5.81)
RDW: 14.2 % (ref 11.5–15.5)
WBC: 7.1 10*3/uL (ref 4.0–10.5)

## 2014-08-27 LAB — COMPREHENSIVE METABOLIC PANEL
ALBUMIN: 3.4 g/dL — AB (ref 3.5–5.0)
ALT: 23 U/L (ref 17–63)
AST: 22 U/L (ref 15–41)
Alkaline Phosphatase: 73 U/L (ref 38–126)
Anion gap: 9 (ref 5–15)
BILIRUBIN TOTAL: 0.1 mg/dL — AB (ref 0.3–1.2)
BUN: 14 mg/dL (ref 6–20)
CHLORIDE: 104 mmol/L (ref 101–111)
CO2: 27 mmol/L (ref 22–32)
CREATININE: 1.22 mg/dL (ref 0.61–1.24)
Calcium: 9.7 mg/dL (ref 8.9–10.3)
GFR calc non Af Amer: 56 mL/min — ABNORMAL LOW (ref >60)
Glucose, Bld: 129 mg/dL — ABNORMAL HIGH (ref 65–99)
Potassium: 4 mmol/L (ref 3.5–5.1)
SODIUM: 140 mmol/L (ref 135–145)
Total Protein: 7.1 g/dL (ref 6.5–8.1)

## 2014-08-27 LAB — URINALYSIS, ROUTINE W REFLEX MICROSCOPIC
Bilirubin Urine: NEGATIVE
Glucose, UA: NEGATIVE mg/dL
Ketones, ur: NEGATIVE mg/dL
Nitrite: NEGATIVE
PH: 7 (ref 5.0–8.0)
PROTEIN: NEGATIVE mg/dL
SPECIFIC GRAVITY, URINE: 1.006 (ref 1.005–1.030)
Urobilinogen, UA: 0.2 mg/dL (ref 0.0–1.0)

## 2014-08-27 LAB — LIPASE, BLOOD: LIPASE: 23 U/L (ref 22–51)

## 2014-08-27 LAB — URINE MICROSCOPIC-ADD ON

## 2014-08-27 MED ORDER — POLYETHYLENE GLYCOL 3350 17 GM/SCOOP PO POWD: ORAL | Status: DC

## 2014-08-27 MED ORDER — MINERAL OIL RE ENEM: ENEMA | RECTAL | Status: DC

## 2014-08-27 NOTE — Discharge Instructions (Signed)

## 2014-08-27 NOTE — ED Notes (Signed)
Pt c/o constipation for past 2 weeks-- pain with straining-- radiates up back.

## 2014-08-27 NOTE — ED Notes (Signed)
Patient transported to X-ray 
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
## 2014-08-27 NOTE — ED Notes (Signed)
MD at bedside. 

## 2014-09-10 ENCOUNTER — Ambulatory Visit (INDEPENDENT_AMBULATORY_CARE_PROVIDER_SITE_OTHER): Payer: Medicare Other | Admitting: Cardiology

## 2014-09-10 ENCOUNTER — Encounter: Payer: Self-pay | Admitting: Cardiology

## 2014-09-10 ENCOUNTER — Ambulatory Visit (INDEPENDENT_AMBULATORY_CARE_PROVIDER_SITE_OTHER): Payer: Medicare Other

## 2014-09-10 VITALS — BP 146/62 | HR 82 | Ht 66.0 in | Wt 192.7 lb

## 2014-09-10 DIAGNOSIS — E785 Hyperlipidemia, unspecified: Secondary | ICD-10-CM

## 2014-09-10 DIAGNOSIS — E1159 Type 2 diabetes mellitus with other circulatory complications: Secondary | ICD-10-CM

## 2014-09-10 DIAGNOSIS — I5032 Chronic diastolic (congestive) heart failure: Secondary | ICD-10-CM | POA: Diagnosis not present

## 2014-09-10 DIAGNOSIS — I10 Essential (primary) hypertension: Secondary | ICD-10-CM | POA: Diagnosis not present

## 2014-09-10 DIAGNOSIS — I2510 Atherosclerotic heart disease of native coronary artery without angina pectoris: Secondary | ICD-10-CM | POA: Diagnosis not present

## 2014-09-10 DIAGNOSIS — Z716 Tobacco abuse counseling: Secondary | ICD-10-CM

## 2014-09-10 DIAGNOSIS — Z7901 Long term (current) use of anticoagulants: Secondary | ICD-10-CM | POA: Diagnosis not present

## 2014-09-10 DIAGNOSIS — I480 Paroxysmal atrial fibrillation: Secondary | ICD-10-CM

## 2014-09-10 DIAGNOSIS — Z79899 Other long term (current) drug therapy: Secondary | ICD-10-CM

## 2014-09-10 DIAGNOSIS — E669 Obesity, unspecified: Secondary | ICD-10-CM

## 2014-09-10 LAB — POCT INR: INR: 3.5

## 2014-09-10 NOTE — Patient Instructions (Signed)
NO CHANGES WITH CURRENT MEDICATIONS  YOUR INR TODAY WAS 3.5- HOLD TODAY'S DOSE AND THEN RESUME REGULAR DOSES RE-CHECK IN 2 WEEKS  PLEASE HAVE LABS DONE ON A DAY YOU HAVE NOT EATEN OR DRINK--CMP LIPIDS   Your physician recommends that you schedule a follow-up appointment in [place]    Your physician wants you to follow-up in 12 months DR HARDING- 30 MIN APPOINTMENT.  You will receive a reminder letter in the mail two months in advance. If you don't receive a letter, please call our office to schedule the follow-up appointment.
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
## 2014-09-11 ENCOUNTER — Encounter: Payer: Self-pay | Admitting: Cardiology

## 2014-09-11 NOTE — Assessment & Plan Note (Signed)
On pravastatin. Due to check lipid panel and chemistries. I don't see recent labs in quite some time.

## 2014-09-11 NOTE — Assessment & Plan Note (Signed)
Rhythm control with flecainide. Rate control with Toprol plus diltiazem at reduced dose. No further bradycardia episodes. Need to monitor for signs and symptoms of tachybradycardia. Anticoagulated with warfarin. Okay to stop aspirin.

## 2014-09-11 NOTE — Assessment & Plan Note (Signed)
He has lost about 5 pounds since May. He was having GI bug and not eating a lot. He is hoping that he can Keep this weight off.

## 2014-09-11 NOTE — Assessment & Plan Note (Signed)
Smoking cessation instruction/counseling given:  counseled patient on the dangers of tobacco use, advised patient to stop smoking, and reviewed strategies to maximize success.  His daughter indicates that she has been pushing him to cut back down and finally quit but they're having a hard time with this. Roughly 4-5 minutes spent discussing thoughts and options. They are reluctant to take other medications.

## 2014-09-11 NOTE — Assessment & Plan Note (Signed)
He is on a beta blocker and statin. Because of hypotension not on ACE inhibitor or ARB since he has the calcium channel blocker for additional rate control. Diabetes controlled by PCP.

## 2014-09-11 NOTE — Assessment & Plan Note (Signed)
Had a history of hypotension and therefore medications were decreased. Borderline pressures today. I'm inclined to just continue to monitor and not up-titrate doses.

## 2014-09-14 ENCOUNTER — Emergency Department (HOSPITAL_COMMUNITY): Payer: Medicare Other

## 2014-09-14 ENCOUNTER — Emergency Department (HOSPITAL_COMMUNITY)
Admission: EM | Admit: 2014-09-14 | Discharge: 2014-09-14 | Disposition: A | Discharge status: Home / Self Care | Payer: Medicare Other | Attending: Emergency Medicine | Admitting: Emergency Medicine

## 2014-09-14 ENCOUNTER — Encounter (HOSPITAL_COMMUNITY): Payer: Self-pay | Admitting: *Deleted

## 2014-09-14 DIAGNOSIS — Z8701 Personal history of pneumonia (recurrent): Secondary | ICD-10-CM | POA: Insufficient documentation

## 2014-09-14 DIAGNOSIS — M1990 Unspecified osteoarthritis, unspecified site: Secondary | ICD-10-CM | POA: Insufficient documentation

## 2014-09-14 DIAGNOSIS — Z79899 Other long term (current) drug therapy: Secondary | ICD-10-CM | POA: Insufficient documentation

## 2014-09-14 DIAGNOSIS — Z7982 Long term (current) use of aspirin: Secondary | ICD-10-CM | POA: Diagnosis not present

## 2014-09-14 DIAGNOSIS — Z794 Long term (current) use of insulin: Secondary | ICD-10-CM | POA: Diagnosis not present

## 2014-09-14 DIAGNOSIS — I10 Essential (primary) hypertension: Secondary | ICD-10-CM | POA: Insufficient documentation

## 2014-09-14 DIAGNOSIS — R0602 Shortness of breath: Secondary | ICD-10-CM | POA: Diagnosis present

## 2014-09-14 DIAGNOSIS — E785 Hyperlipidemia, unspecified: Secondary | ICD-10-CM | POA: Diagnosis not present

## 2014-09-14 DIAGNOSIS — I2510 Atherosclerotic heart disease of native coronary artery without angina pectoris: Secondary | ICD-10-CM | POA: Insufficient documentation

## 2014-09-14 DIAGNOSIS — Z7901 Long term (current) use of anticoagulants: Secondary | ICD-10-CM | POA: Diagnosis not present

## 2014-09-14 DIAGNOSIS — I4892 Unspecified atrial flutter: Secondary | ICD-10-CM | POA: Diagnosis not present

## 2014-09-14 DIAGNOSIS — I5032 Chronic diastolic (congestive) heart failure: Secondary | ICD-10-CM | POA: Diagnosis not present

## 2014-09-14 DIAGNOSIS — J441 Chronic obstructive pulmonary disease with (acute) exacerbation: Secondary | ICD-10-CM | POA: Diagnosis not present

## 2014-09-14 DIAGNOSIS — Z720 Tobacco use: Secondary | ICD-10-CM | POA: Diagnosis not present

## 2014-09-14 DIAGNOSIS — D649 Anemia, unspecified: Secondary | ICD-10-CM | POA: Insufficient documentation

## 2014-09-14 DIAGNOSIS — E119 Type 2 diabetes mellitus without complications: Secondary | ICD-10-CM | POA: Diagnosis not present

## 2014-09-14 DIAGNOSIS — I480 Paroxysmal atrial fibrillation: Secondary | ICD-10-CM | POA: Insufficient documentation

## 2014-09-14 DIAGNOSIS — J40 Bronchitis, not specified as acute or chronic: Secondary | ICD-10-CM

## 2014-09-14 LAB — BASIC METABOLIC PANEL
Anion gap: 11 (ref 5–15)
BUN: 15 mg/dL (ref 6–20)
CO2: 25 mmol/L (ref 22–32)
CREATININE: 1.17 mg/dL (ref 0.61–1.24)
Calcium: 9 mg/dL (ref 8.9–10.3)
Chloride: 101 mmol/L (ref 101–111)
GFR calc non Af Amer: 59 mL/min — ABNORMAL LOW (ref >60)
Glucose, Bld: 148 mg/dL — ABNORMAL HIGH (ref 65–99)
Potassium: 4 mmol/L (ref 3.5–5.1)
Sodium: 137 mmol/L (ref 135–145)

## 2014-09-14 LAB — CBC
HCT: 31.2 % — ABNORMAL LOW (ref 39.0–52.0)
Hemoglobin: 9.5 g/dL — ABNORMAL LOW (ref 13.0–17.0)
MCH: 26.2 pg (ref 26.0–34.0)
MCHC: 30.4 g/dL (ref 30.0–36.0)
MCV: 86 fL (ref 78.0–100.0)
PLATELETS: 314 10*3/uL (ref 150–400)
RBC: 3.63 MIL/uL — ABNORMAL LOW (ref 4.22–5.81)
RDW: 13.8 % (ref 11.5–15.5)
WBC: 7.1 10*3/uL (ref 4.0–10.5)

## 2014-09-14 LAB — BRAIN NATRIURETIC PEPTIDE: B NATRIURETIC PEPTIDE 5: 59.2 pg/mL (ref 0.0–100.0)

## 2014-09-14 LAB — I-STAT TROPONIN, ED: Troponin i, poc: 0.01 ng/mL (ref 0.00–0.08)

## 2014-09-14 MED ORDER — ALBUTEROL SULFATE HFA 108 (90 BASE) MCG/ACT IN AERS
4.0000 | INHALATION_SPRAY | Freq: Once | RESPIRATORY_TRACT | Status: AC
  Administered 2014-09-14: 4 via RESPIRATORY_TRACT
  Filled 2014-09-14: qty 6.7

## 2014-09-14 MED ORDER — ACETAMINOPHEN 500 MG PO TABS
1000.0000 mg | ORAL_TABLET | Freq: Once | ORAL | Status: AC
  Administered 2014-09-14: 1000 mg via ORAL
  Filled 2014-09-14: qty 2

## 2014-09-14 MED ORDER — PREDNISONE 20 MG PO TABS: ORAL_TABLET | ORAL | Status: DC

## 2014-09-14 MED ORDER — ALBUTEROL SULFATE HFA 108 (90 BASE) MCG/ACT IN AERS: 2.0000 | INHALATION_SPRAY | RESPIRATORY_TRACT | Status: DC | PRN
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
## 2014-09-14 NOTE — ED Notes (Signed)
Pt state that he is having SOB with recent bronchitis. Denies chest pain.

## 2014-09-18 ENCOUNTER — Encounter (HOSPITAL_COMMUNITY): Payer: Self-pay | Admitting: Emergency Medicine

## 2014-09-18 ENCOUNTER — Inpatient Hospital Stay (HOSPITAL_COMMUNITY)
Admission: EM | Admit: 2014-09-18 | Discharge: 2014-09-24 | DRG: 194 | Disposition: A | Discharge status: Home / Self Care | Payer: Medicare Other | Attending: Internal Medicine | Admitting: Internal Medicine

## 2014-09-18 ENCOUNTER — Emergency Department (HOSPITAL_COMMUNITY): Payer: Medicare Other

## 2014-09-18 DIAGNOSIS — I739 Peripheral vascular disease, unspecified: Secondary | ICD-10-CM | POA: Diagnosis present

## 2014-09-18 DIAGNOSIS — I5032 Chronic diastolic (congestive) heart failure: Secondary | ICD-10-CM | POA: Diagnosis present

## 2014-09-18 DIAGNOSIS — D509 Iron deficiency anemia, unspecified: Secondary | ICD-10-CM | POA: Diagnosis present

## 2014-09-18 DIAGNOSIS — I25118 Atherosclerotic heart disease of native coronary artery with other forms of angina pectoris: Secondary | ICD-10-CM | POA: Diagnosis present

## 2014-09-18 DIAGNOSIS — Z794 Long term (current) use of insulin: Secondary | ICD-10-CM

## 2014-09-18 DIAGNOSIS — K2970 Gastritis, unspecified, without bleeding: Secondary | ICD-10-CM | POA: Diagnosis present

## 2014-09-18 DIAGNOSIS — F17200 Nicotine dependence, unspecified, uncomplicated: Secondary | ICD-10-CM | POA: Diagnosis present

## 2014-09-18 DIAGNOSIS — E785 Hyperlipidemia, unspecified: Secondary | ICD-10-CM | POA: Diagnosis present

## 2014-09-18 DIAGNOSIS — I10 Essential (primary) hypertension: Secondary | ICD-10-CM | POA: Diagnosis present

## 2014-09-18 DIAGNOSIS — R109 Unspecified abdominal pain: Secondary | ICD-10-CM | POA: Diagnosis not present

## 2014-09-18 DIAGNOSIS — E872 Acidosis: Secondary | ICD-10-CM | POA: Diagnosis present

## 2014-09-18 DIAGNOSIS — Z720 Tobacco use: Secondary | ICD-10-CM | POA: Diagnosis not present

## 2014-09-18 DIAGNOSIS — N200 Calculus of kidney: Secondary | ICD-10-CM | POA: Diagnosis present

## 2014-09-18 DIAGNOSIS — J449 Chronic obstructive pulmonary disease, unspecified: Secondary | ICD-10-CM | POA: Diagnosis present

## 2014-09-18 DIAGNOSIS — J189 Pneumonia, unspecified organism: Secondary | ICD-10-CM | POA: Diagnosis present

## 2014-09-18 DIAGNOSIS — I480 Paroxysmal atrial fibrillation: Secondary | ICD-10-CM | POA: Diagnosis present

## 2014-09-18 DIAGNOSIS — E119 Type 2 diabetes mellitus without complications: Secondary | ICD-10-CM | POA: Diagnosis present

## 2014-09-18 DIAGNOSIS — K5900 Constipation, unspecified: Secondary | ICD-10-CM | POA: Diagnosis not present

## 2014-09-18 DIAGNOSIS — Z7982 Long term (current) use of aspirin: Secondary | ICD-10-CM | POA: Diagnosis not present

## 2014-09-18 DIAGNOSIS — D638 Anemia in other chronic diseases classified elsewhere: Secondary | ICD-10-CM | POA: Diagnosis present

## 2014-09-18 DIAGNOSIS — Z7901 Long term (current) use of anticoagulants: Secondary | ICD-10-CM

## 2014-09-18 DIAGNOSIS — E1159 Type 2 diabetes mellitus with other circulatory complications: Secondary | ICD-10-CM | POA: Diagnosis not present

## 2014-09-18 DIAGNOSIS — I2510 Atherosclerotic heart disease of native coronary artery without angina pectoris: Secondary | ICD-10-CM | POA: Diagnosis present

## 2014-09-18 DIAGNOSIS — D649 Anemia, unspecified: Secondary | ICD-10-CM | POA: Diagnosis not present

## 2014-09-18 DIAGNOSIS — R1012 Left upper quadrant pain: Secondary | ICD-10-CM

## 2014-09-18 DIAGNOSIS — R1032 Left lower quadrant pain: Secondary | ICD-10-CM | POA: Diagnosis present

## 2014-09-18 DIAGNOSIS — F17210 Nicotine dependence, cigarettes, uncomplicated: Secondary | ICD-10-CM | POA: Diagnosis present

## 2014-09-18 HISTORY — DX: Pneumonia, unspecified organism: J18.9

## 2014-09-18 LAB — CBC WITH DIFFERENTIAL/PLATELET
BASOS ABS: 0.1 10*3/uL (ref 0.0–0.1)
Basophils Relative: 1 % (ref 0–1)
Eosinophils Absolute: 0.4 10*3/uL (ref 0.0–0.7)
Eosinophils Relative: 4 % (ref 0–5)
HCT: 31.9 % — ABNORMAL LOW (ref 39.0–52.0)
Hemoglobin: 9.7 g/dL — ABNORMAL LOW (ref 13.0–17.0)
LYMPHS ABS: 2 10*3/uL (ref 0.7–4.0)
Lymphocytes Relative: 22 % (ref 12–46)
MCH: 26.1 pg (ref 26.0–34.0)
MCHC: 30.4 g/dL (ref 30.0–36.0)
MCV: 86 fL (ref 78.0–100.0)
MONO ABS: 0.7 10*3/uL (ref 0.1–1.0)
Monocytes Relative: 8 % (ref 3–12)
Neutro Abs: 6.1 10*3/uL (ref 1.7–7.7)
Neutrophils Relative %: 65 % (ref 43–77)
Platelets: 292 10*3/uL (ref 150–400)
RBC: 3.71 MIL/uL — ABNORMAL LOW (ref 4.22–5.81)
RDW: 14 % (ref 11.5–15.5)
WBC: 9.3 10*3/uL (ref 4.0–10.5)

## 2014-09-18 LAB — COMPREHENSIVE METABOLIC PANEL
ALK PHOS: 53 U/L (ref 38–126)
ALT: 22 U/L (ref 17–63)
AST: 28 U/L (ref 15–41)
Albumin: 3.4 g/dL — ABNORMAL LOW (ref 3.5–5.0)
Anion gap: 13 (ref 5–15)
BUN: 13 mg/dL (ref 6–20)
CO2: 24 mmol/L (ref 22–32)
Calcium: 8.9 mg/dL (ref 8.9–10.3)
Chloride: 105 mmol/L (ref 101–111)
Creatinine, Ser: 1.09 mg/dL (ref 0.61–1.24)
GLUCOSE: 118 mg/dL — AB (ref 65–99)
POTASSIUM: 3.8 mmol/L (ref 3.5–5.1)
Sodium: 142 mmol/L (ref 135–145)
Total Bilirubin: 0.3 mg/dL (ref 0.3–1.2)
Total Protein: 5.8 g/dL — ABNORMAL LOW (ref 6.5–8.1)

## 2014-09-18 LAB — I-STAT CHEM 8, ED
BUN: 17 mg/dL (ref 6–20)
CHLORIDE: 105 mmol/L (ref 101–111)
Calcium, Ion: 1.09 mmol/L — ABNORMAL LOW (ref 1.13–1.30)
Creatinine, Ser: 1.2 mg/dL (ref 0.61–1.24)
Glucose, Bld: 112 mg/dL — ABNORMAL HIGH (ref 65–99)
HCT: 32 % — ABNORMAL LOW (ref 39.0–52.0)
Hemoglobin: 10.9 g/dL — ABNORMAL LOW (ref 13.0–17.0)
Potassium: 3.9 mmol/L (ref 3.5–5.1)
SODIUM: 142 mmol/L (ref 135–145)
TCO2: 24 mmol/L (ref 0–100)

## 2014-09-18 LAB — URINALYSIS, ROUTINE W REFLEX MICROSCOPIC
Bilirubin Urine: NEGATIVE
Glucose, UA: NEGATIVE mg/dL
Hgb urine dipstick: NEGATIVE
KETONES UR: NEGATIVE mg/dL
NITRITE: NEGATIVE
PROTEIN: NEGATIVE mg/dL
SPECIFIC GRAVITY, URINE: 1.016 (ref 1.005–1.030)
UROBILINOGEN UA: 0.2 mg/dL (ref 0.0–1.0)
pH: 7 (ref 5.0–8.0)

## 2014-09-18 LAB — URINE MICROSCOPIC-ADD ON

## 2014-09-18 LAB — GLUCOSE, CAPILLARY
Glucose-Capillary: 104 mg/dL — ABNORMAL HIGH (ref 65–99)
Glucose-Capillary: 118 mg/dL — ABNORMAL HIGH (ref 65–99)
Glucose-Capillary: 108 mg/dL — ABNORMAL HIGH (ref 65–99)

## 2014-09-18 LAB — LIPASE, BLOOD: Lipase: 29 U/L (ref 22–51)

## 2014-09-18 LAB — STREP PNEUMONIAE URINARY ANTIGEN: Strep Pneumo Urinary Antigen: NEGATIVE

## 2014-09-18 LAB — I-STAT CG4 LACTIC ACID, ED: Lactic Acid, Venous: 3.06 mmol/L (ref 0.5–2.0)

## 2014-09-18 LAB — PROTIME-INR
INR: 2.42 — AB (ref 0.00–1.49)
Prothrombin Time: 26 seconds — ABNORMAL HIGH (ref 11.6–15.2)

## 2014-09-18 MED ORDER — OXYCODONE-ACETAMINOPHEN 5-325 MG PO TABS
1.0000 | ORAL_TABLET | Freq: Four times a day (QID) | ORAL | Status: DC | PRN
  Administered 2014-09-18 – 2014-09-21 (×9): 1 via ORAL
  Filled 2014-09-18 (×11): qty 1

## 2014-09-18 MED ORDER — DILTIAZEM HCL ER COATED BEADS 120 MG PO CP24
120.0000 mg | ORAL_CAPSULE | Freq: Every day | ORAL | Status: DC
  Administered 2014-09-18 – 2014-09-24 (×7): 120 mg via ORAL
  Filled 2014-09-18 (×7): qty 1

## 2014-09-18 MED ORDER — ACETAMINOPHEN 650 MG RE SUPP: 650.0000 mg | Freq: Four times a day (QID) | RECTAL | Status: DC | PRN

## 2014-09-18 MED ORDER — FLECAINIDE ACETATE 50 MG PO TABS
225.0000 mg | ORAL_TABLET | Freq: Two times a day (BID) | ORAL | Status: DC
  Administered 2014-09-18 – 2014-09-24 (×13): 225 mg via ORAL
  Filled 2014-09-18 (×19): qty 1

## 2014-09-18 MED ORDER — ACETAMINOPHEN 325 MG PO TABS: 650.0000 mg | ORAL_TABLET | Freq: Four times a day (QID) | ORAL | Status: DC | PRN

## 2014-09-18 MED ORDER — LORATADINE 10 MG PO TABS
10.0000 mg | ORAL_TABLET | Freq: Two times a day (BID) | ORAL | Status: DC
  Administered 2014-09-18 – 2014-09-24 (×12): 10 mg via ORAL
  Filled 2014-09-18 (×13): qty 1

## 2014-09-18 MED ORDER — AZITHROMYCIN 250 MG PO TABS
500.0000 mg | ORAL_TABLET | Freq: Once | ORAL | Status: AC
  Administered 2014-09-18: 500 mg via ORAL
  Filled 2014-09-18: qty 2

## 2014-09-18 MED ORDER — INSULIN GLARGINE 100 UNIT/ML ~~LOC~~ SOLN
8.0000 [IU] | Freq: Two times a day (BID) | SUBCUTANEOUS | Status: DC
  Administered 2014-09-18 – 2014-09-24 (×12): 8 [IU] via SUBCUTANEOUS
  Filled 2014-09-18 (×15): qty 0.08

## 2014-09-18 MED ORDER — SODIUM CHLORIDE 0.9 % IV BOLUS (SEPSIS)
1000.0000 mL | Freq: Once | INTRAVENOUS | Status: AC
  Administered 2014-09-18: 1000 mL via INTRAVENOUS

## 2014-09-18 MED ORDER — DEXTROSE 5 % IV SOLN
500.0000 mg | INTRAVENOUS | Status: DC
  Administered 2014-09-19 – 2014-09-24 (×6): 500 mg via INTRAVENOUS
  Filled 2014-09-18 (×6): qty 500

## 2014-09-18 MED ORDER — IOHEXOL 300 MG/ML  SOLN
25.0000 mL | Freq: Once | INTRAMUSCULAR | Status: AC | PRN
  Administered 2014-09-18: 25 mL via ORAL

## 2014-09-18 MED ORDER — ASPIRIN EC 81 MG PO TBEC
81.0000 mg | DELAYED_RELEASE_TABLET | Freq: Every day | ORAL | Status: DC
  Administered 2014-09-18 – 2014-09-24 (×7): 81 mg via ORAL
  Filled 2014-09-18 (×7): qty 1

## 2014-09-18 MED ORDER — METOPROLOL SUCCINATE 12.5 MG HALF TABLET
12.5000 mg | ORAL_TABLET | Freq: Every day | ORAL | Status: DC
  Administered 2014-09-18 – 2014-09-24 (×6): 12.5 mg via ORAL
  Filled 2014-09-18 (×8): qty 1

## 2014-09-18 MED ORDER — CEFTRIAXONE SODIUM 2 G IJ SOLR
2.0000 g | Freq: Once | INTRAMUSCULAR | Status: AC
  Administered 2014-09-18: 2 g via INTRAVENOUS
  Filled 2014-09-18: qty 2

## 2014-09-18 MED ORDER — MORPHINE SULFATE 4 MG/ML IJ SOLN
4.0000 mg | Freq: Once | INTRAMUSCULAR | Status: AC
  Administered 2014-09-18: 4 mg via INTRAVENOUS
  Filled 2014-09-18: qty 1

## 2014-09-18 MED ORDER — HYDROMORPHONE HCL 1 MG/ML IJ SOLN
1.0000 mg | Freq: Once | INTRAMUSCULAR | Status: AC
  Administered 2014-09-18: 1 mg via INTRAVENOUS
  Filled 2014-09-18: qty 1

## 2014-09-18 MED ORDER — ACETAMINOPHEN 500 MG PO TABS
1000.0000 mg | ORAL_TABLET | Freq: Once | ORAL | Status: AC
  Administered 2014-09-18: 1000 mg via ORAL
  Filled 2014-09-18: qty 2

## 2014-09-18 MED ORDER — WARFARIN SODIUM 7.5 MG PO TABS
7.5000 mg | ORAL_TABLET | Freq: Once | ORAL | Status: AC
  Administered 2014-09-18: 7.5 mg via ORAL
  Filled 2014-09-18: qty 1

## 2014-09-18 MED ORDER — KETOROLAC TROMETHAMINE 30 MG/ML IJ SOLN
30.0000 mg | Freq: Once | INTRAMUSCULAR | Status: AC
  Administered 2014-09-18: 30 mg via INTRAVENOUS
  Filled 2014-09-18: qty 1

## 2014-09-18 MED ORDER — SODIUM CHLORIDE 0.9 % IV SOLN
INTRAVENOUS | Status: DC
  Administered 2014-09-18: 100 mL/h via INTRAVENOUS
  Administered 2014-09-18: 23:00:00 via INTRAVENOUS

## 2014-09-18 MED ORDER — OXYCODONE-ACETAMINOPHEN 5-325 MG PO TABS
1.0000 | ORAL_TABLET | Freq: Four times a day (QID) | ORAL | Status: DC | PRN
Start: 2014-09-18 — End: 2014-09-18

## 2014-09-18 MED ORDER — PRAVASTATIN SODIUM 40 MG PO TABS
40.0000 mg | ORAL_TABLET | Freq: Every day | ORAL | Status: DC
  Administered 2014-09-18 – 2014-09-23 (×6): 40 mg via ORAL
  Filled 2014-09-18 (×7): qty 1

## 2014-09-18 MED ORDER — IOHEXOL 300 MG/ML  SOLN
100.0000 mL | Freq: Once | INTRAMUSCULAR | Status: AC | PRN
  Administered 2014-09-18: 100 mL via INTRAVENOUS

## 2014-09-18 MED ORDER — INSULIN ASPART 100 UNIT/ML ~~LOC~~ SOLN
0.0000 [IU] | Freq: Three times a day (TID) | SUBCUTANEOUS | Status: DC
  Administered 2014-09-19 – 2014-09-20 (×3): 1 [IU] via SUBCUTANEOUS
  Administered 2014-09-20: 2 [IU] via SUBCUTANEOUS
  Administered 2014-09-21 (×2): 1 [IU] via SUBCUTANEOUS
  Administered 2014-09-22: 2 [IU] via SUBCUTANEOUS
  Administered 2014-09-23: 1 [IU] via SUBCUTANEOUS
  Administered 2014-09-23 (×2): 2 [IU] via SUBCUTANEOUS
  Administered 2014-09-24: 1 [IU] via SUBCUTANEOUS

## 2014-09-18 MED ORDER — WARFARIN - PHARMACIST DOSING INPATIENT
Freq: Every day | Status: DC
  Administered 2014-09-21 – 2014-09-22 (×2)
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
## 2014-09-18 NOTE — Progress Notes (Signed)
ANTIBIOTIC CONSULT NOTE - INITIAL Pharmacy Consult for Ceftriaxone Indication: CAP ANTICOAGULATION CONSULT NOTE - Initial Consult Pharmacy Consult for Coumadin  Indication: Afib   No Known Allergies  Patient Measurements: Height: 5\' 6"  (167.6 cm) Weight: 192 lb (87.091 kg) IBW/kg (Calculated) : 63.8 Adjusted Body Weight:   Vital Signs: Temp: 98.1 F (36.7 C) (06/24 0135) Temp Source: Oral (06/24 0135) BP: 107/52 mmHg (06/24 1115) Pulse Rate: 69 (06/24 1115) Intake/Output from previous day:   Intake/Output from this shift:    Labs:  Recent Labs  09/18/14 0147 09/18/14 0207  WBC 9.3  --   HGB 9.7* 10.9*  PLT 292  --   CREATININE 1.09 1.20   Estimated Creatinine Clearance: 55 mL/min (by C-G formula based on Cr of 1.2). No results for input(s): VANCOTROUGH, VANCOPEAK, VANCORANDOM, GENTTROUGH, GENTPEAK, GENTRANDOM, TOBRATROUGH, TOBRAPEAK, TOBRARND, AMIKACINPEAK, AMIKACINTROU, AMIKACIN in the last 72 hours.   Labs:  Recent Labs  09/18/14 0147 09/18/14 0207  HGB 9.7* 10.9*  HCT 31.9* 32.0*  PLT 292  --   LABPROT 26.0*  --   INR 2.42*  --   CREATININE 1.09 1.20     Microbiology: Recent Results (from the past 720 hour(s))  Culture, blood (routine x 2)     Status: None (Preliminary result)   Collection Time: 09/18/14  6:30 AM  Result Value Ref Range Status   Specimen Description BLOOD RIGHT ANTECUBITAL  Final   Special Requests BOTTLES DRAWN AEROBIC AND ANAEROBIC 4CC  Final   Culture PENDING  Incomplete   Report Status PENDING  Incomplete    Medical History: Past Medical History  Diagnosis Date  . Atrial flutter     a. 07/2010 Status post caval tricuspid isthmus ablation by Dr. Midge Aver Metoprolol daily  . PAF (paroxysmal atrial fibrillation)     a. Recurrent after atrial flutter, currently controlled on flecainide plus diltiazem  . Hyperlipidemia     takes Pravastatin daily  . Diastolic CHF, chronic     a. 12/2012 EF 55-60%, diast dysfxn, triv  MR, mildly dil LA/RA.  [name] Coronary artery disease, non-occlusive     a. 03/2010 Nonocclusive disease by cath, performed for ST elevations on ECG;  b. 06/2013 Lexi MV: EF 60%, no ischemia.  [name] COPD (chronic obstructive pulmonary disease)   . Cervical radiculopathy due to degenerative joint disease of spine   . Claudication     a. lower ext dopplers 08/18/11-normal ABIs bilaterally with normal triphasic waveforms  . Arthritis   . HTN (hypertension)     takes Diltiazem daily  . Diabetes mellitus type II     takes Metformin and Lantus daily  . Dysrhythmia     HX OF ATRIAL IFB /FLUTTER takes Flecanide and Coumadin daily  . Pneumonia     several years ago  . History of bronchitis     1998  . Weakness     numbness and tingling both hands  . Joint pain   . History of gastric ulcer   . Anemia     takes Ferrous Sulfate daily  . History of blood transfusion 1982    no abnormal reaction noted  . Balance problem 01/2014    Medications:  Prescriptions prior to admission  Medication Sig Dispense Refill Last Dose  . albuterol (PROVENTIL HFA;VENTOLIN HFA) 108 (90 BASE) MCG/ACT inhaler Inhale 2 puffs into the lungs every 4 (four) hours as needed for wheezing or shortness of breath. 1 Inhaler 0 unk  . aspirin EC 81 MG tablet Take 81  mg by mouth daily.   09/17/2014 at Unknown time  . diltiazem (CARDIZEM CD) 120 MG 24 hr capsule Take 1 capsule (120 mg total) by mouth daily. 90 capsule 3 09/17/2014 at Unknown time  . ferrous sulfate 325 (65 FE) MG tablet Take 325 mg by mouth daily with breakfast.   09/17/2014 at Unknown time  . flecainide (TAMBOCOR) 150 MG tablet Take 1.5 tablets (225 mg total) by mouth 2 (two) times daily. 270 tablet 2 09/17/2014 at Unknown time  . hydrocortisone cream 1 % Apply 1 application topically 2 (two) times daily as needed for itching (rash).    unk  . LANTUS SOLOSTAR 100 UNIT/ML Solostar Pen Inject 8 Units as directed 2 (two) times daily. Inject 8 units twice a day   09/17/2014 at  Unknown time  . loratadine (CLARITIN) 10 MG tablet Take 10 mg by mouth 2 (two) times daily.    09/17/2014 at Unknown time  . metFORMIN (GLUCOPHAGE) 1000 MG tablet Take 500 mg by mouth 2 (two) times daily with a meal.    09/17/2014 at Unknown time  . metoprolol succinate (TOPROL XL) 25 MG 24 hr tablet Take 0.5 tablets (12.5 mg total) by mouth daily. (Patient taking differently: Take 25 mg by mouth daily. ) 45 tablet 1 09/17/2014 at unk  . Multiple Vitamin (MULTIVITAMIN WITH MINERALS) TABS tablet Take 1 tablet by mouth daily.   09/17/2014 at Unknown time  . nitroGLYCERIN (NITROSTAT) 0.4 MG SL tablet Place 1 tablet (0.4 mg total) under the tongue every 5 (five) minutes x 3 doses as needed for chest pain. 25 tablet 9 unk  . polyethylene glycol powder (MIRALAX) powder 6 capfuls tomorrow morning. Then 3 capfuls three times per day for 3 days. Then 1 capful three times per day for 3 days. Then 1 capful per day for 7 days. 255 g 0 09/17/2014 at Unknown time  . pravastatin (PRAVACHOL) 40 MG tablet Take 40 mg by mouth daily at 6 PM.    09/17/2014 at Unknown time  . predniSONE (DELTASONE) 20 MG tablet 2 tabs po daily x 4 days 8 tablet 0 09/17/2014 at Unknown time  . tamsulosin (FLOMAX) 0.4 MG CAPS capsule Take 0.4 mg by mouth daily.    09/17/2014 at Unknown time  . warfarin (COUMADIN) 5 MG tablet Take 7.5-10 mg by mouth daily at 6 PM. Take 2 tablet (10 mg) on Friday, take 1 1/2 tablets (7.5 mg) on Tuesday, Wednesday, Thursday, Saturday, Sunday, Monday   09/17/2014 at Unknown time   Scheduled:  . aspirin EC  81 mg Oral Daily  . [START ON 09/19/2014] azithromycin  500 mg Intravenous Q24H  . diltiazem  120 mg Oral Daily  . flecainide  225 mg Oral BID  . insulin aspart  0-9 Units Subcutaneous TID WC  . Insulin Glargine  8 Units Subcutaneous BID  . loratadine  10 mg Oral BID  . metoprolol succinate  12.5 mg Oral Daily  . pravastatin  40 mg Oral q1800   Assessment: 74 y.o male with h/o PAF s/p ablation con chronic  coumadin PTA. Presented to ED this AM 09/18/14 with chief complaint of abdominal pain. MD also notes patient complained of coughing yellow-colored phlegm over the past few days, also admits to having chills. Patient chest x-ray shows pneumonia. WBC 9.3K, Afebrile. Left lower quadrant abdominal pain ? Cause. ? Referred pain from pneumonia,  CT abdomen pelvis is negative for significant lesion per MD's assessment.  Patient admitted and has received first doses  of IV Ceftriaxone and azithromycin this AM.   INR = 2.42 on admission 6/23. INR is therapeutic. H/H low at 10.9/32, PLTC wnl 292. His PTA coumadin dose is Coumadin 7.5 mg daily except takes 10 mg every Friday for h/o PAF s/p ablation. Coumadin last taken on 09/17/14. Continues on flecainide and metoprolol. Pharmacy consulted to dose IV ceftriaxone and continue coumadin. Hypoprothrombinemic effects of warfarin may be increased by azithromycin. I will lower today's coumadin dose to 7.5 mg due to potential drug interaction with azithromycin. (usual dose today would be 10mg ).   Goal of Therapy:  INR 2-3 Monitor platelets by anticoagulation protocol: Yes    Plan:  Ceftriaxone 2 g IV q24 hours Follow blood cultures 2. Coumadin 7.5 mg po x 1 Daily INR  Thank you for allowing pharmacy to be part of this patients care team.  Nicole Cella, [place] Clinical Pharmacist Pager: [phone]

## 2014-09-18 NOTE — ED Notes (Signed)
Pt arrives with LLQ abdominal pain ongoing for two days. No constipation, N/V/'D.

## 2014-09-18 NOTE — Progress Notes (Signed)
Admission note:  Arrival Method:   Patient arrived on stretcher from ED with staff accompanying. Mental Orientation:  Alert and oriented x 4. Telemetry: 6E-01, NSR, CCMD notified. Assessment: See doc flow sheets. Skin: Warm, dry and intact.  No open areas or any wounds noted. No redness or any pressure areas noted.  Assessed with [name].   IV: Left wrist, patent transfusing NS 100 ml/hr. Pain: 4/ 10 from his abdomen, but didn't want anything at this moment. Tubes: [name]. Safety Measures: Bed in low position, call bell and phone within reach.  Bed alarm as patient gait is unsteady. Fall Prevention Safety Plan: Reviewed the plan, understood and acknowledged. Admission Screening: In progress. 6700 Orientation: Patient has been oriented to the unit, staff and to the room.

## 2014-09-18 NOTE — ED Notes (Signed)
Admitting MD at bedside. Patient requesting pain medication. MD aware.
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
## 2014-09-18 NOTE — Discharge Instructions (Addendum)
Follow with Primary MD BOUSKA,DAVID E, MD in 2-3 days   Get CBC, CMP, INR, 2 view Chest X ray checked  by Primary MD next visit.    Activity: As tolerated with Full fall precautions use walker/cane & assistance as needed   Disposition Home     Diet: Heart Healthy  Low Carb  For Heart failure patients - Check your Weight same time everyday, if you gain over 2 pounds, or you develop in leg swelling, experience more shortness of breath or chest pain, call your Primary MD immediately. Follow Cardiac Low Salt Diet and 1.5 lit/day fluid restriction.   On your next visit with your primary care physician please Get Medicines reviewed and adjusted.   Please request your Prim.MD to go over all Hospital Tests and Procedure/Radiological results at the follow up, please get all Hospital records sent to your Prim MD by signing hospital release before you go home.   If you experience worsening of your admission symptoms, develop shortness of breath, life threatening emergency, suicidal or homicidal thoughts you must seek medical attention immediately by calling 911 or calling your MD immediately  if symptoms less severe.  You Must read complete instructions/literature along with all the possible adverse reactions/side effects for all the Medicines you take and that have been prescribed to you. Take any new Medicines after you have completely understood and accpet all the possible adverse reactions/side effects.   Do not drive, operating heavy machinery, perform activities at heights, swimming or participation in water activities or provide baby sitting services if your were admitted for syncope or siezures until you have seen by Primary MD or a Neurologist and advised to do so again.  Do not drive when taking Pain medications.    Do not take more than prescribed Pain, Sleep and Anxiety Medications  Special Instructions: If you have smoked or chewed Tobacco  in the last 2 yrs please stop smoking,  stop any regular Alcohol  and or any Recreational drug use.  Wear Seat belts while driving.   Please note  You were cared for by a hospitalist during your hospital stay. If you have any questions about your discharge medications or the care you received while you were in the hospital after you are discharged, you can call the unit and asked to speak with the hospitalist on call if the hospitalist that took care of you is not available. Once you are discharged, your primary care physician will handle any further medical issues. Please note that NO REFILLS for any discharge medications will be authorized once you are discharged, as it is imperative that you return to your primary care physician (or establish a relationship with a primary care physician if you do not have one) for your aftercare needs so that they can reassess your need for medications and monitor your lab values.          Information on my medicine - Coumadin   (Warfarin)  This medication education was reviewed with me or my healthcare representative as part of my discharge preparation.  The pharmacist that spoke with me during my hospital stay was:  Arman Bogus, [facility]  Why was Coumadin prescribed for you? Coumadin was prescribed for you because you have a blood clot or a medical condition that can cause an increased risk of forming blood clots. Blood clots can cause serious health problems by blocking the flow of blood to the heart, lung, or brain. Coumadin can prevent harmful blood clots from  forming. As a reminder your indication for Coumadin is:   Stroke Prevention Because Of Atrial Fibrillation  What test will check on my response to Coumadin? While on Coumadin (warfarin) you will need to have an INR test regularly to ensure that your dose is keeping you in the desired range. The INR (international normalized ratio) number is calculated from the result of the laboratory test called prothrombin time (PT).  If an  INR APPOINTMENT HAS NOT ALREADY BEEN MADE FOR YOU please schedule an appointment to have this lab work done by your health care provider within 7 days. Your INR goal is usually a number between:  2 to 3 or your provider may give you a more narrow range like 2-2.5.  Ask your health care provider during an office visit what your goal INR is.  What  do you need to  know  About  COUMADIN? Take Coumadin (warfarin) exactly as prescribed by your healthcare provider about the same time each day.  DO NOT stop taking without talking to the doctor who prescribed the medication.  Stopping without other blood clot prevention medication to take the place of Coumadin may increase your risk of developing a new clot or stroke.  Get refills before you run out.  What do you do if you miss a dose? If you miss a dose, take it as soon as you remember on the same day then continue your regularly scheduled regimen the next day.  Do not take two doses of Coumadin at the same time.  Important Safety Information A possible side effect of Coumadin (Warfarin) is an increased risk of bleeding. You should call your healthcare provider right away if you experience any of the following: ? Bleeding from an injury or your nose that does not stop. ? Unusual colored urine (red or dark brown) or unusual colored stools (red or black). ? Unusual bruising for unknown reasons. ? A serious fall or if you hit your head (even if there is no bleeding).  Some foods or medicines interact with Coumadin (warfarin) and might alter your response to warfarin. To help avoid this: ? Eat a balanced diet, maintaining a consistent amount of Vitamin K. ? Notify your provider about major diet changes you plan to make. ? Avoid alcohol or limit your intake to 1 drink for women and 2 drinks for men per day. (1 drink is 5 oz. wine, 12 oz. beer, or 1.5 oz. liquor.)  Make sure that ANY health care provider who prescribes medication for you knows that you are  taking Coumadin (warfarin).  Also make sure the healthcare provider who is monitoring your Coumadin knows when you have started a new medication including herbals and non-prescription products.  Coumadin (Warfarin)  Major Drug Interactions  Increased Warfarin Effect Decreased Warfarin Effect  Alcohol (large quantities) Antibiotics (esp. Septra/Bactrim, Flagyl, Cipro) Amiodarone (Cordarone) Aspirin (ASA) Cimetidine (Tagamet) Megestrol (Megace) NSAIDs (ibuprofen, naproxen, etc.) Piroxicam (Feldene) Propafenone (Rythmol SR) Propranolol (Inderal) Isoniazid (INH) Posaconazole (Noxafil) Barbiturates (Phenobarbital) Carbamazepine (Tegretol) Chlordiazepoxide (Librium) Cholestyramine (Questran) Griseofulvin Oral Contraceptives Rifampin Sucralfate (Carafate) Vitamin K   Coumadin (Warfarin) Major Herbal Interactions  Increased Warfarin Effect Decreased Warfarin Effect  Garlic Ginseng Ginkgo biloba Coenzyme Q10 Green tea St. Johns wort    Coumadin (Warfarin) FOOD Interactions  Eat a consistent number of servings per week of foods HIGH in Vitamin K (1 serving =  cup)  Collards (cooked, or boiled & drained) Kale (cooked, or boiled & drained) Mustard greens (cooked, or boiled &  drained) Parsley *serving size only =  cup Spinach (cooked, or boiled & drained) Swiss chard (cooked, or boiled & drained) Turnip greens (cooked, or boiled & drained)  Eat a consistent number of servings per week of foods MEDIUM-HIGH in Vitamin K (1 serving = 1 cup)  Asparagus (cooked, or boiled & drained) Broccoli (cooked, boiled & drained, or raw & chopped) Brussel sprouts (cooked, or boiled & drained) *serving size only =  cup Lettuce, raw (green leaf, endive, romaine) Spinach, raw Turnip greens, raw & chopped   These websites have more information on Coumadin (warfarin):  FailFactory.se; VeganReport.com.au;

## 2014-09-19 DIAGNOSIS — Z720 Tobacco use: Secondary | ICD-10-CM

## 2014-09-19 DIAGNOSIS — D649 Anemia, unspecified: Secondary | ICD-10-CM

## 2014-09-19 LAB — COMPREHENSIVE METABOLIC PANEL
ALK PHOS: 47 U/L (ref 38–126)
ALT: 19 U/L (ref 17–63)
ANION GAP: 5 (ref 5–15)
AST: 22 U/L (ref 15–41)
Albumin: 2.8 g/dL — ABNORMAL LOW (ref 3.5–5.0)
BILIRUBIN TOTAL: 0.2 mg/dL — AB (ref 0.3–1.2)
BUN: 9 mg/dL (ref 6–20)
CALCIUM: 7.9 mg/dL — AB (ref 8.9–10.3)
CO2: 25 mmol/L (ref 22–32)
Chloride: 109 mmol/L (ref 101–111)
Creatinine, Ser: 0.97 mg/dL (ref 0.61–1.24)
GFR calc Af Amer: >60 mL/min (ref >60)
GFR calc non Af Amer: >60 mL/min (ref >60)
Glucose, Bld: 102 mg/dL — ABNORMAL HIGH (ref 65–99)
Potassium: 3.8 mmol/L (ref 3.5–5.1)
Sodium: 139 mmol/L (ref 135–145)
Total Protein: 4.9 g/dL — ABNORMAL LOW (ref 6.5–8.1)

## 2014-09-19 LAB — VITAMIN B12: VITAMIN B 12: 371 pg/mL (ref 180–914)

## 2014-09-19 LAB — RETICULOCYTES
RBC.: 3.57 MIL/uL — AB (ref 4.22–5.81)
Retic Count, Absolute: 117.8 10*3/uL (ref 19.0–186.0)
Retic Ct Pct: 3.3 % — ABNORMAL HIGH (ref 0.4–3.1)

## 2014-09-19 LAB — CBC
HCT: 25.8 % — ABNORMAL LOW (ref 39.0–52.0)
HEMOGLOBIN: 7.7 g/dL — AB (ref 13.0–17.0)
MCH: 25.8 pg — AB (ref 26.0–34.0)
MCHC: 29.8 g/dL — ABNORMAL LOW (ref 30.0–36.0)
MCV: 86.3 fL (ref 78.0–100.0)
PLATELETS: 237 10*3/uL (ref 150–400)
RBC: 2.99 MIL/uL — AB (ref 4.22–5.81)
RDW: 14.2 % (ref 11.5–15.5)
WBC: 6.9 10*3/uL (ref 4.0–10.5)

## 2014-09-19 LAB — IRON AND TIBC
Iron: 17 ug/dL — ABNORMAL LOW (ref 45–182)
Saturation Ratios: 4 % — ABNORMAL LOW (ref 17.9–39.5)
TIBC: 421 ug/dL (ref 250–450)
UIBC: 404 ug/dL

## 2014-09-19 LAB — HEMOGLOBIN A1C
Hgb A1c MFr Bld: 5.8 % — ABNORMAL HIGH (ref 4.8–5.6)
MEAN PLASMA GLUCOSE: 120 mg/dL

## 2014-09-19 LAB — PROTIME-INR
INR: 2.7 — ABNORMAL HIGH (ref 0.00–1.49)
PROTHROMBIN TIME: 28.3 s — AB (ref 11.6–15.2)

## 2014-09-19 LAB — GLUCOSE, CAPILLARY
Glucose-Capillary: 126 mg/dL — ABNORMAL HIGH (ref 65–99)
Glucose-Capillary: 109 mg/dL — ABNORMAL HIGH (ref 65–99)
Glucose-Capillary: 135 mg/dL — ABNORMAL HIGH (ref 65–99)
Glucose-Capillary: 114 mg/dL — ABNORMAL HIGH (ref 65–99)

## 2014-09-19 LAB — MRSA PCR SCREENING: MRSA BY PCR: NEGATIVE

## 2014-09-19 LAB — TSH: TSH: 5.407 u[IU]/mL — AB (ref 0.350–4.500)

## 2014-09-19 LAB — TYPE AND SCREEN
ABO/RH(D): A NEG
Antibody Screen: NEGATIVE

## 2014-09-19 MED ORDER — FLORANEX PO PACK
1.0000 g | PACK | Freq: Three times a day (TID) | ORAL | Status: DC
  Administered 2014-09-19 – 2014-09-24 (×14): 1 g via ORAL
  Filled 2014-09-19 (×18): qty 1

## 2014-09-19 MED ORDER — SENNOSIDES-DOCUSATE SODIUM 8.6-50 MG PO TABS
2.0000 | ORAL_TABLET | Freq: Two times a day (BID) | ORAL | Status: DC
Start: 2014-09-19 — End: 2014-09-24
  Administered 2014-09-19 – 2014-09-23 (×8): 2 via ORAL
  Filled 2014-09-19 (×12): qty 2

## 2014-09-19 MED ORDER — GUAIFENESIN ER 600 MG PO TB12
600.0000 mg | ORAL_TABLET | Freq: Two times a day (BID) | ORAL | Status: DC
  Administered 2014-09-19 – 2014-09-24 (×11): 600 mg via ORAL
  Filled 2014-09-19 (×12): qty 1

## 2014-09-19 MED ORDER — NICOTINE 14 MG/24HR TD PT24
14.0000 mg | MEDICATED_PATCH | Freq: Every day | TRANSDERMAL | Status: DC
  Administered 2014-09-19 – 2014-09-24 (×6): 14 mg via TRANSDERMAL
  Filled 2014-09-19 (×6): qty 1

## 2014-09-19 MED ORDER — FAMOTIDINE 40 MG/5ML PO SUSR
20.0000 mg | Freq: Every day | ORAL | Status: DC
  Administered 2014-09-19 – 2014-09-22 (×4): 20 mg via ORAL
  Filled 2014-09-19 (×4): qty 2.5

## 2014-09-19 MED ORDER — PANTOPRAZOLE SODIUM 20 MG PO TBEC
20.0000 mg | DELAYED_RELEASE_TABLET | Freq: Every day | ORAL | Status: DC
  Administered 2014-09-19 – 2014-09-22 (×4): 20 mg via ORAL
  Filled 2014-09-19 (×4): qty 1

## 2014-09-19 MED ORDER — ALUM & MAG HYDROXIDE-SIMETH 200-200-20 MG/5ML PO SUSP
30.0000 mL | Freq: Four times a day (QID) | ORAL | Status: DC | PRN
  Administered 2014-09-21 – 2014-09-23 (×4): 30 mL via ORAL
  Filled 2014-09-19 (×4): qty 30

## 2014-09-19 MED ORDER — IPRATROPIUM BROMIDE 0.02 % IN SOLN
0.5000 mg | Freq: Four times a day (QID) | RESPIRATORY_TRACT | Status: DC
  Administered 2014-09-19 – 2014-09-24 (×16): 0.5 mg via RESPIRATORY_TRACT
  Filled 2014-09-19 (×18): qty 2.5

## 2014-09-19 MED ORDER — LEVALBUTEROL HCL 1.25 MG/0.5ML IN NEBU
1.2500 mg | INHALATION_SOLUTION | Freq: Four times a day (QID) | RESPIRATORY_TRACT | Status: DC
  Administered 2014-09-19 – 2014-09-24 (×16): 1.25 mg via RESPIRATORY_TRACT
  Filled 2014-09-19 (×25): qty 0.5

## 2014-09-19 MED ORDER — WARFARIN SODIUM 7.5 MG PO TABS
7.5000 mg | ORAL_TABLET | Freq: Once | ORAL | Status: AC
  Administered 2014-09-19: 7.5 mg via ORAL
  Filled 2014-09-19: qty 1

## 2014-09-19 MED ORDER — CEFTRIAXONE SODIUM IN DEXTROSE 40 MG/ML IV SOLN
2.0000 g | INTRAVENOUS | Status: DC
  Administered 2014-09-19 – 2014-09-20 (×2): 2 g via INTRAVENOUS
  Filled 2014-09-19 (×3): qty 50

## 2014-09-19 NOTE — Progress Notes (Addendum)
ANTICOAGULATION CONSULT NOTE - Follow Up Consult  Pharmacy Consult for Coumadin Indication: atrial fibrillation  No Known Allergies  Patient Measurements: Height: 5\' 6"  (167.6 cm) Weight: 199 lb 8.3 oz (90.5 kg) IBW/kg (Calculated) : 63.8  Vital Signs: Temp: 97.7 F (36.5 C) (06/25 0910) Temp Source: Oral (06/25 0910) BP: 153/68 mmHg (06/25 0910) Pulse Rate: 77 (06/25 0910)  Labs:  Recent Labs  09/18/14 0147 09/18/14 0207 09/19/14 0250  HGB 9.7* 10.9* 7.7*  HCT 31.9* 32.0* 25.8*  PLT 292  --  237  LABPROT 26.0*  --  28.3*  INR 2.42*  --  2.70*  CREATININE 1.09 1.20 0.97    Estimated Creatinine Clearance: 69.3 mL/min (by C-G formula based on Cr of 0.97).  Assessment: 74 y.o male with h/o PAF s/p ablation con chronic coumadin PTA. Presented to ED this AM 09/18/14 with chief complaint of abdominal pain. INR on admit 6/24 = 2.42, therapeutic. Current INR is therapeutic at 2.7. H/H dropped from 10.9 to 7.7, pltc wnl, no bleeding reported. Pt also on ceftriaxone and azithromycin for CAP, watch trend in INR closely.  PTA dose: 7.5 mg daily except takes 10 mg qFri   Goal of Therapy:  INR 2-3 Monitor platelets by anticoagulation protocol: Yes   Plan:  Coumadin 7.5 mg po x 1 Monitor daily INR, CBC, s/s of bleed  [name] J 09/19/2014,9:47 AM

## 2014-09-19 NOTE — Progress Notes (Signed)
Patient is refusing the bed alarm at this time. Educated patient that the bed alarm is used for the patient's safety. Patient stated he will call when he needs to get out of bed. Call light within reach, will continue to monitor.   Shelbie Hutching, RN, BSN

## 2014-09-19 NOTE — Progress Notes (Addendum)
PROGRESS NOTE  [name] PPJ:[id number] DOB: Jan 28, 1941 DOA: 09/18/2014 PCP: [name], MD  HPI/Recap of past 24 hours:  Sitting on edge of the bed, reported less left sided ab pain, still intermittent minimal productive cough, currently on room air, talking in full sentences, no respiratory distress.  Assessment/Plan: Active Problems:   Coronary artery disease, non-occlusive   DM (diabetes mellitus), type 2   Long term current use of anticoagulant therapy   Tobacco use disorder   CAP (community acquired pneumonia)   Pneumonia  Normalcytic anemia, acute on chronic, no obvious sign of blood loss. anemia work up pending, Consider transfusion if hgb less than 7 or symptomatic. Denies dizziness, no chest pain, no weakness, no DOE. lft wnl, less likely hemolysis.  Remote history of peptic ulcer requiring partial gastrectomy. Denies blood in stool, denies epigastric pain, no n/v. FOBT pending.  Pneumonia, likely community acquired with background copd Patient chest x-ray shows Patchy bilateral airspace opacities, more prominent at the left lung base, raise concern for multifocal pneumonia. Underlying peribronchial thickening noted. IV Rocephin and Zithromax. Add nebs and mucolytics. urinary strep pneumo antigen negative, Legionella antigen pending. blood cultures 2 pending, sputum culture pending collection   Left lower quadrant abdominal pain ? Cause. ? Referred pain from pneumonia Patient presents with left lower quadrant abdominal pain, CT abdomen pelvis is negative for significant lesion. He does have a history of diverticulosis but no diverticulitis on the CT abdomen pelvis. Non obstruction stone on lower pole of right kidney, UA, no blood. Continue pain medications  Last colonoscopy in 11/2013: ENDOSCOPIC IMPRESSION: 1. Diminutive polyp was found in the rectum; polypectomy was performed with a cold snare 2. Mild diverticulosis was noted in the left colon 3. The colon  mucosa was otherwise normal . Moderate internal Hemoorhoids as source of chronic minor rectal bleeding RECOMMENDATIONS: 1. Anusol HC supp. prn 2. Okay to resume Coumadin if medically necessary.Return to the care of your primary provider. GI follow up as needed  Insulin dependent Diabetes mellitus Hold metformin, initiate sliding scale insulin with NovoLog, continue home meds lantus a1c 5.8  History of paroxysmal A. Fib/aflutter s/p ablation Continue flecainide and metoprolol. ekg sinus rhythm with first degree av block. Continue warfarin per pharmacy consultation. INR therapeutic.  Smoker: smoking cessation education provided, agreed with nicotine patch .  Code Status: full  Family Communication: patient  Disposition Plan: remain inpatient   Consultants:  none  Procedures:  none  Antibiotics:  Rocephin/zithro from admission   Objective: BP 153/68 mmHg  Pulse 77  Temp(Src) 97.7 F (36.5 C) (Oral)  Resp 18  Ht 5\' 6"  (1.676 m)  Wt 90.5 kg (199 lb 8.3 oz)  BMI 32.22 kg/m2  SpO2 97%  Intake/Output Summary (Last 24 hours) at 09/19/14 1117 Last data filed at 09/19/14 0946  Gross per 24 hour  Intake   1680 ml  Output   1700 ml  Net    -20 ml   Filed Weights   09/18/14 0135 09/18/14 2036  Weight: 87.091 kg (192 lb) 90.5 kg (199 lb 8.3 oz)    Exam:   General:  NAD  Cardiovascular: RRR  Respiratory: bilbasilar rhonchi, prolonged expiratory phase, mild expiratory wheezes. No rales.  Abdomen: left sided tender, no guarding, Soft/ND, positive BS, old well healed mid line surgical scar.  Musculoskeletal: No Edema  Neuro: aaox3, no focal deficit  Data Reviewed: Basic Metabolic Panel:  Recent Labs Lab 09/14/14 1737 09/18/14 0147 09/18/14 0207 09/19/14 0250  NA 137 142  142 139  K 4.0 3.8 3.9 3.8  CL 101 105 105 109  CO2 25 24  --  25  GLUCOSE 148* 118* 112* 102*  BUN 15 13 17 9   CREATININE 1.17 1.09 1.20 0.97  CALCIUM 9.0 8.9  --  7.9*   Liver  Function Tests:  Recent Labs Lab 09/18/14 0147 09/19/14 0250  AST 28 22  ALT 22 19  ALKPHOS 53 47  BILITOT 0.3 0.2*  PROT 5.8* 4.9*  ALBUMIN 3.4* 2.8*    Recent Labs Lab 09/18/14 0147  LIPASE 29   No results for input(s): AMMONIA in the last 168 hours. CBC:  Recent Labs Lab 09/14/14 1737 09/18/14 0147 09/18/14 0207 09/19/14 0250  WBC 7.1 9.3  --  6.9  NEUTROABS  --  6.1  --   --   HGB 9.5* 9.7* 10.9* 7.7*  HCT 31.2* 31.9* 32.0* 25.8*  MCV 86.0 86.0  --  86.3  PLT 314 292  --  237   Cardiac Enzymes:   No results for input(s): CKTOTAL, CKMB, CKMBINDEX, TROPONINI in the last 168 hours. BNP (last 3 results)  Recent Labs  03/21/14 0324 08/07/14 2327 09/14/14 1737  BNP 40.1 47.8 59.2    ProBNP (last 3 results)  Recent Labs  01/08/14 0930  PROBNP 105.2    CBG:  Recent Labs Lab 09/18/14 1220 09/18/14 1720 09/18/14 2034 09/19/14 0742  GLUCAP 104* 118* 108* 126*    Recent Results (from the past 240 hour(s))  Culture, blood (routine x 2)     Status: None (Preliminary result)   Collection Time: 09/18/14  6:30 AM  Result Value Ref Range Status   Specimen Description BLOOD RIGHT ANTECUBITAL  Final   Special Requests BOTTLES DRAWN AEROBIC AND ANAEROBIC 4CC  Final   Culture PENDING  Incomplete   Report Status PENDING  Incomplete     Studies: No results found.  Scheduled Meds: . aspirin EC  81 mg Oral Daily  . azithromycin  500 mg Intravenous Q24H  . diltiazem  120 mg Oral Daily  . flecainide  225 mg Oral BID  . guaiFENesin  600 mg Oral BID  . insulin aspart  0-9 Units Subcutaneous TID WC  . insulin glargine  8 Units Subcutaneous BID  . ipratropium  0.5 mg Nebulization Q6H  . lactobacillus  1 g Oral TID WC  . levalbuterol  1.25 mg Nebulization 4 times per day  . loratadine  10 mg Oral BID  . metoprolol succinate  12.5 mg Oral Daily  . pravastatin  40 mg Oral q1800  . senna-docusate  2 tablet Oral BID  . warfarin  7.5 mg Oral ONCE-1800  .  Warfarin - Pharmacist Dosing Inpatient   Does not apply q1800    Continuous Infusions: . sodium chloride 100 mL/hr at 09/18/14 2235     Time spent: 75mins  Lianny Molter MD, PhD  [facility] Pager [phone]. If 7PM-7AM, please contact night-coverage at [email or web address], password [facility] 09/19/2014, 11:17 AM  LOS: 1 day

## 2014-09-19 NOTE — Progress Notes (Signed)
Utilization review completed.  

## 2014-09-20 DIAGNOSIS — K5900 Constipation, unspecified: Secondary | ICD-10-CM

## 2014-09-20 DIAGNOSIS — R109 Unspecified abdominal pain: Secondary | ICD-10-CM

## 2014-09-20 LAB — GLUCOSE, CAPILLARY
GLUCOSE-CAPILLARY: 110 mg/dL — AB (ref 65–99)
GLUCOSE-CAPILLARY: 126 mg/dL — AB (ref 65–99)
GLUCOSE-CAPILLARY: 154 mg/dL — AB (ref 65–99)
GLUCOSE-CAPILLARY: 94 mg/dL (ref 65–99)

## 2014-09-20 LAB — CBC
HCT: 28.1 % — ABNORMAL LOW (ref 39.0–52.0)
HEMATOCRIT: 26.4 % — AB (ref 39.0–52.0)
Hemoglobin: 8 g/dL — ABNORMAL LOW (ref 13.0–17.0)
Hemoglobin: 8.4 g/dL — ABNORMAL LOW (ref 13.0–17.0)
MCH: 25.8 pg — ABNORMAL LOW (ref 26.0–34.0)
MCH: 25.5 pg — AB (ref 26.0–34.0)
MCHC: 30.3 g/dL (ref 30.0–36.0)
MCHC: 29.9 g/dL — ABNORMAL LOW (ref 30.0–36.0)
MCV: 85.2 fL (ref 78.0–100.0)
MCV: 85.4 fL (ref 78.0–100.0)
Platelets: 247 10*3/uL (ref 150–400)
Platelets: 270 10*3/uL (ref 150–400)
RBC: 3.1 MIL/uL — ABNORMAL LOW (ref 4.22–5.81)
RBC: 3.29 MIL/uL — AB (ref 4.22–5.81)
RDW: 14.2 % (ref 11.5–15.5)
RDW: 14.1 % (ref 11.5–15.5)
WBC: 6.3 10*3/uL (ref 4.0–10.5)
WBC: 7 10*3/uL (ref 4.0–10.5)

## 2014-09-20 LAB — OCCULT BLOOD X 1 CARD TO LAB, STOOL: Fecal Occult Bld: POSITIVE — AB

## 2014-09-20 LAB — BASIC METABOLIC PANEL
ANION GAP: 5 (ref 5–15)
BUN: 9 mg/dL (ref 6–20)
CALCIUM: 8 mg/dL — AB (ref 8.9–10.3)
CO2: 27 mmol/L (ref 22–32)
Chloride: 110 mmol/L (ref 101–111)
Creatinine, Ser: 1.08 mg/dL (ref 0.61–1.24)
GFR calc non Af Amer: >60 mL/min (ref >60)
Glucose, Bld: 102 mg/dL — ABNORMAL HIGH (ref 65–99)
Potassium: 3.8 mmol/L (ref 3.5–5.1)
Sodium: 142 mmol/L (ref 135–145)

## 2014-09-20 LAB — PROTIME-INR
INR: 2.45 — ABNORMAL HIGH (ref 0.00–1.49)
Prothrombin Time: 26.3 seconds — ABNORMAL HIGH (ref 11.6–15.2)

## 2014-09-20 LAB — LACTIC ACID, PLASMA: Lactic Acid, Venous: 0.7 mmol/L (ref 0.5–2.0)

## 2014-09-20 LAB — MAGNESIUM: MAGNESIUM: 2.1 mg/dL (ref 1.7–2.4)

## 2014-09-20 LAB — T4, FREE: Free T4: 0.88 ng/dL (ref 0.61–1.12)

## 2014-09-20 MED ORDER — POLYETHYLENE GLYCOL 3350 17 G PO PACK
17.0000 g | PACK | Freq: Every day | ORAL | Status: DC
  Administered 2014-09-20 – 2014-09-24 (×3): 17 g via ORAL
  Filled 2014-09-20 (×5): qty 1

## 2014-09-20 MED ORDER — WARFARIN SODIUM 7.5 MG PO TABS
7.5000 mg | ORAL_TABLET | ORAL | Status: AC
  Administered 2014-09-20: 7.5 mg via ORAL
  Filled 2014-09-20: qty 1

## 2014-09-20 MED ORDER — WARFARIN SODIUM 10 MG PO TABS: 10.0000 mg | ORAL_TABLET | ORAL | Status: DC

## 2014-09-20 NOTE — Progress Notes (Signed)
ANTICOAGULATION CONSULT NOTE - Follow Up Consult  Pharmacy Consult for Coumadin Indication: atrial fibrillation  No Known Allergies  Patient Measurements: Height: 5\' 6"  (167.6 cm) Weight: 201 lb 1 oz (91.2 kg) IBW/kg (Calculated) : 63.8  Vital Signs: Temp: 98 F (36.7 C) (06/26 0921) Temp Source: Oral (06/26 0921) BP: 132/66 mmHg (06/26 0921) Pulse Rate: 85 (06/26 0921)  Labs:  Recent Labs  09/18/14 0147 09/18/14 0207 09/19/14 0250 09/20/14 0331  HGB 9.7* 10.9* 7.7* 8.0*  HCT 31.9* 32.0* 25.8* 26.4*  PLT 292  --  237 247  LABPROT 26.0*  --  28.3* 26.3*  INR 2.42*  --  2.70* 2.45*  CREATININE 1.09 1.20 0.97 1.08    Estimated Creatinine Clearance: 62.5 mL/min (by C-G formula based on Cr of 1.08).  Assessment: 74 y.o male with h/o PAF s/p ablation con chronic coumadin PTA. Presented to ED this AM 09/18/14 with chief complaint of abdominal pain. INR on admit 6/24 = 2.42, therapeutic. Current INR is therapeutic at 2.45. H/H dropped from 10.9 to 7.7 but now stable at 8, pltc wnl, no bleeding reported. Pt also on ceftriaxone and azithromycin for CAP.  Goal of Therapy:  INR 2-3 Monitor platelets by anticoagulation protocol: Yes   Plan:  Continue home Coumadin 7.5 mg PO daily exc 10mg  on Friday Monitor daily INR, CBC, s/s of bleed  Dameisha Tschida J 09/20/2014,9:31 AM

## 2014-09-20 NOTE — Progress Notes (Signed)
PROGRESS NOTE  [name] VOH:[id number] DOB: 07/09/1940 DOA: 09/18/2014 PCP: [name], MD  HPI/Recap of past 24 hours:  Still c/o left sided ab pain, less cough, reported breathing treatment helped.  on room air, no respiratory distress.  Assessment/Plan: Active Problems:   Coronary artery disease, non-occlusive   DM (diabetes mellitus), type 2   Long term current use of anticoagulant therapy   Tobacco use disorder   CAP (community acquired pneumonia)   Pneumonia  Normalcytic anemia, likely multifactorial including anemia of chronic disease, and iron deficiency. acute on chronic, no obvious sign of blood loss.  Consider transfusion if hgb less than 7 or symptomatic. Denies dizziness, no chest pain, no weakness, no DOE. anemia work up pending: Remote history of peptic ulcer requiring partial gastrectomy. Denies blood in stool, denies epigastric pain, no n/v. FOBT pending collection. lft wnl, less likely hemolysis. B12/folate wnl. retic inappropriately low.  +iron deficiency, will start iron supplement after FOBT.  Pneumonia, likely community acquired with background copd Patient chest x-ray shows Patchy bilateral airspace opacities, more prominent at the left lung base, raise concern for multifocal pneumonia. Underlying peribronchial thickening noted. IV Rocephin and Zithromax. Add nebs and mucolytics. urinary strep pneumo antigen negative, Legionella antigen pending. blood cultures 2 pending, sputum culture pending collection   Left lower quadrant abdominal pain ? Cause. ? Referred pain from pneumonia Patient presents with left lower quadrant abdominal pain, CT abdomen pelvis is negative for significant lesion. He does have a history of diverticulosis but no diverticulitis on the CT abdomen pelvis. Non obstruction stone on lower pole of right kidney, UA, no blood. Continue pain medications  Last colonoscopy in 11/2013: ENDOSCOPIC IMPRESSION: 1. Diminutive polyp  was found in the rectum; polypectomy was performed with a cold snare 2. Mild diverticulosis was noted in the left colon 3. The colon mucosa was otherwise normal . Moderate internal Hemoorhoids as source of chronic minor rectal bleeding RECOMMENDATIONS: 1. Anusol HC supp. prn 2. Okay to resume Coumadin if medically necessary.Return to the care of your primary provider. GI follow up as needed  Insulin dependent Diabetes mellitus Hold metformin, initiate sliding scale insulin with NovoLog, continue home meds lantus a1c 5.8  History of paroxysmal A. Fib/aflutter s/p ablation Continue flecainide and metoprolol. ekg sinus rhythm with first degree av block. Continue warfarin per pharmacy consultation. INR therapeutic.  Smoker: smoking cessation education provided, agreed with nicotine patch .  Elevated tsh, check free t4.  Lactic acidosis: resolved.  Constipation: stool softener, wonder if constipation contribute to ab pain. Consider repeat CT ab if pain persist.  Code Status: full  Family Communication: patient  Disposition Plan: remain inpatient   Consultants:  none  Procedures:  none  Antibiotics:  Rocephin/zithro from admission   Objective: BP 132/66 mmHg  Pulse 85  Temp(Src) 98 F (36.7 C) (Oral)  Resp 18  Ht 5\' 6"  (1.676 m)  Wt 91.2 kg (201 lb 1 oz)  BMI 32.47 kg/m2  SpO2 94%  Intake/Output Summary (Last 24 hours) at 09/20/14 1424 Last data filed at 09/20/14 1138  Gross per 24 hour  Intake   1060 ml  Output    950 ml  Net    110 ml   Filed Weights   09/18/14 0135 09/18/14 2036 09/19/14 2133  Weight: 87.091 kg (192 lb) 90.5 kg (199 lb 8.3 oz) 91.2 kg (201 lb 1 oz)    Exam:   General:  NAD  Cardiovascular: RRR  Respiratory: bilbasilar rhonchi, prolonged expiratory phase, mild  expiratory wheezes. No rales.  Abdomen: left sided tender, no guarding, Soft/ND, positive BS, old well healed mid line surgical scar.  Musculoskeletal: No Edema  Neuro:  aaox3, no focal deficit  Data Reviewed: Basic Metabolic Panel:  Recent Labs Lab 09/14/14 1737 09/18/14 0147 09/18/14 0207 09/19/14 0250 09/20/14 0331  NA 137 142 142 139 142  K 4.0 3.8 3.9 3.8 3.8  CL 101 105 105 109 110  CO2 25 24  --  25 27  GLUCOSE 148* 118* 112* 102* 102*  BUN 15 13 17 9 9   CREATININE 1.17 1.09 1.20 0.97 1.08  CALCIUM 9.0 8.9  --  7.9* 8.0*  MG  --   --   --   --  2.1   Liver Function Tests:  Recent Labs Lab 09/18/14 0147 09/19/14 0250  AST 28 22  ALT 22 19  ALKPHOS 53 47  BILITOT 0.3 0.2*  PROT 5.8* 4.9*  ALBUMIN 3.4* 2.8*    Recent Labs Lab 09/18/14 0147  LIPASE 29   No results for input(s): AMMONIA in the last 168 hours. CBC:  Recent Labs Lab 09/14/14 1737 09/18/14 0147 09/18/14 0207 09/19/14 0250 09/20/14 0331 09/20/14 1135  WBC 7.1 9.3  --  6.9 6.3 7.0  NEUTROABS  --  6.1  --   --   --   --   HGB 9.5* 9.7* 10.9* 7.7* 8.0* 8.4*  HCT 31.2* 31.9* 32.0* 25.8* 26.4* 28.1*  MCV 86.0 86.0  --  86.3 85.2 85.4  PLT 314 292  --  237 247 270   Cardiac Enzymes:   No results for input(s): CKTOTAL, CKMB, CKMBINDEX, TROPONINI in the last 168 hours. BNP (last 3 results)  Recent Labs  03/21/14 0324 08/07/14 2327 09/14/14 1737  BNP 40.1 47.8 59.2    ProBNP (last 3 results)  Recent Labs  01/08/14 0930  PROBNP 105.2    CBG:  Recent Labs Lab 09/19/14 1148 09/19/14 1648 09/19/14 2130 09/20/14 0747 09/20/14 1146  GLUCAP 109* 135* 114* 110* 126*    Recent Results (from the past 240 hour(s))  Culture, blood (routine x 2)     Status: None (Preliminary result)   Collection Time: 09/18/14  6:30 AM  Result Value Ref Range Status   Specimen Description BLOOD RIGHT ARM  Final   Special Requests BOTTLES DRAWN AEROBIC AND ANAEROBIC 5CC  Final   Culture NO GROWTH 2 DAYS  Final   Report Status PENDING  Incomplete  Culture, blood (routine x 2)     Status: None (Preliminary result)   Collection Time: 09/18/14  6:30 AM  Result  Value Ref Range Status   Specimen Description BLOOD RIGHT ANTECUBITAL  Final   Special Requests BOTTLES DRAWN AEROBIC AND ANAEROBIC 4CC  Final   Culture NO GROWTH 2 DAYS  Final   Report Status PENDING  Incomplete  MRSA PCR Screening     Status: None   Collection Time: 09/19/14  1:05 PM  Result Value Ref Range Status   MRSA by PCR NEGATIVE NEGATIVE Final    Comment:        The GeneXpert MRSA Assay (FDA approved for NASAL specimens only), is one component of a comprehensive MRSA colonization surveillance program. It is not intended to diagnose MRSA infection nor to guide or monitor treatment for MRSA infections.      Studies: No results found.  Scheduled Meds: . aspirin EC  81 mg Oral Daily  . azithromycin  500 mg Intravenous Q24H  . cefTRIAXone (ROCEPHIN)  IV  2 g Intravenous Q24H  . diltiazem  120 mg Oral Daily  . famotidine  20 mg Oral Daily  . flecainide  225 mg Oral BID  . guaiFENesin  600 mg Oral BID  . insulin aspart  0-9 Units Subcutaneous TID WC  . insulin glargine  8 Units Subcutaneous BID  . ipratropium  0.5 mg Nebulization Q6H  . lactobacillus  1 g Oral TID WC  . levalbuterol  1.25 mg Nebulization 4 times per day  . loratadine  10 mg Oral BID  . metoprolol succinate  12.5 mg Oral Daily  . nicotine  14 mg Transdermal Daily  . pantoprazole  20 mg Oral Daily  . pravastatin  40 mg Oral q1800  . senna-docusate  2 tablet Oral BID  . warfarin  7.5 mg Oral Once per day on Sun Mon Tue Wed Thu Sat   And  . [START ON 09/25/2014] warfarin  10 mg Oral Once per day on Fri  . Warfarin - Pharmacist Dosing Inpatient   Does not apply q1800    Continuous Infusions: . sodium chloride 100 mL/hr at 09/18/14 2235     Time spent: 2mins  Aiko Belko MD, PhD  [facility] Pager [phone]. If 7PM-7AM, please contact night-coverage at [email or web address], password [facility] 09/20/2014, 2:24 PM  LOS: 2 days

## 2014-09-21 DIAGNOSIS — D509 Iron deficiency anemia, unspecified: Secondary | ICD-10-CM

## 2014-09-21 LAB — FOLATE RBC
Folate, RBC: >2088 ng/mL (ref >498)
HEMATOCRIT: 29.7 % — AB (ref 37.5–51.0)

## 2014-09-21 LAB — LEGIONELLA ANTIGEN, URINE

## 2014-09-21 LAB — BASIC METABOLIC PANEL
ANION GAP: 5 (ref 5–15)
BUN: 9 mg/dL (ref 6–20)
CHLORIDE: 111 mmol/L (ref 101–111)
CO2: 25 mmol/L (ref 22–32)
Calcium: 8.3 mg/dL — ABNORMAL LOW (ref 8.9–10.3)
Creatinine, Ser: 1.03 mg/dL (ref 0.61–1.24)
GFR calc Af Amer: >60 mL/min (ref >60)
GFR calc non Af Amer: >60 mL/min (ref >60)
Glucose, Bld: 112 mg/dL — ABNORMAL HIGH (ref 65–99)
POTASSIUM: 3.7 mmol/L (ref 3.5–5.1)
Sodium: 141 mmol/L (ref 135–145)

## 2014-09-21 LAB — CBC
HEMATOCRIT: 29.4 % — AB (ref 39.0–52.0)
HEMOGLOBIN: 8.6 g/dL — AB (ref 13.0–17.0)
MCH: 25.2 pg — AB (ref 26.0–34.0)
MCHC: 29.3 g/dL — AB (ref 30.0–36.0)
MCV: 86.2 fL (ref 78.0–100.0)
Platelets: 289 10*3/uL (ref 150–400)
RBC: 3.41 MIL/uL — ABNORMAL LOW (ref 4.22–5.81)
RDW: 14.2 % (ref 11.5–15.5)
WBC: 6.9 10*3/uL (ref 4.0–10.5)

## 2014-09-21 LAB — GLUCOSE, CAPILLARY
GLUCOSE-CAPILLARY: 127 mg/dL — AB (ref 65–99)
GLUCOSE-CAPILLARY: 97 mg/dL (ref 65–99)
GLUCOSE-CAPILLARY: 127 mg/dL — AB (ref 65–99)
Glucose-Capillary: 140 mg/dL — ABNORMAL HIGH (ref 65–99)

## 2014-09-21 LAB — MAGNESIUM: Magnesium: 2 mg/dL (ref 1.7–2.4)

## 2014-09-21 LAB — PROTIME-INR
INR: 2.13 — AB (ref 0.00–1.49)
PROTHROMBIN TIME: 23.6 s — AB (ref 11.6–15.2)

## 2014-09-21 MED ORDER — WARFARIN SODIUM 7.5 MG PO TABS
7.5000 mg | ORAL_TABLET | Freq: Once | ORAL | Status: AC
  Administered 2014-09-21: 7.5 mg via ORAL
  Filled 2014-09-21: qty 1

## 2014-09-21 MED ORDER — CEFTRIAXONE SODIUM IN DEXTROSE 20 MG/ML IV SOLN
1.0000 g | INTRAVENOUS | Status: DC
  Administered 2014-09-21 – 2014-09-23 (×2): 1 g via INTRAVENOUS
  Filled 2014-09-21 (×4): qty 50

## 2014-09-21 NOTE — Consult Note (Signed)
Referring Provider: Dr. Florencia Reasons ([facility]) Primary Care Physician:  [name], MD Primary Gastroenterologist:  None (unassigned); had inpatient colonoscopy last year by Dr. Henrene Pastor  Reason for Consultation:  Abdominal pain, heme positive stool and anemia  HPI: [name] is a 74 y.o. male admitted through the emergency room 2 days ago because of left lower quadrant pain. His admission chest x-ray showed evidence of left-sided pneumonia, which is currently under treatment.  He indicates that his abdominal pain has been occurring on almost a daily basis for the past 6 or 8 months. The pain will "keep going on" unless he gets pain medication, although he did not have any pain medication as an outpatient, apart from over-the-counter use of Aleve. It seemed to be worse shortly after his evening meal and would be made better by bowel movement. He does have a tendency toward constipation.  In addition, the patient notes that he has had the recent onset of indigestion symptoms, something he had not experienced for the past 34 years since he had a partial gastrectomy for ulcer disease.  Since coming into the hospital, his spells of pain have been well controlled by the use of pain medication. The pain typically resolves within an hour of taking the pain medicine.  The pain is in the left lower quadrant, roughly at the level of the waistline. It comes on and can become quite severe. It does not radiate elsewhere. No associated nausea or vomiting. As noted, there is typically relieved after a bowel movement.  With respect to the heme positive stool and anemia, his stool was noted to be Hemoccult-positive on the day of admission. He is, however, on a daily aspirin and is also on Coumadin. His her current hemoglobin of 8.5 is substantially lower than where it was a year ago, when it was 13.5. At that time, he underwent colonoscopy by Dr. Rockne Menghini, because of a history of hematochezia. Moderate  internal hemorrhoids and minimal sigmoid diverticulosis were noted but there was no finding of vascular ectasia, colitis, or other sources of low-grade blood loss.  The patient does smoke about one half pack per day but is in the process of quitting. He used to smoke 2 packs per day.   Past Medical History  Diagnosis Date  . Atrial flutter     a. 07/2010 Status post caval tricuspid isthmus ablation by Dr. Midge Aver Metoprolol daily  . PAF (paroxysmal atrial fibrillation)     a. Recurrent after atrial flutter, currently controlled on flecainide plus diltiazem  . Hyperlipidemia     takes Pravastatin daily  . Diastolic CHF, chronic     a. 12/2012 EF 55-60%, diast dysfxn, triv MR, mildly dil LA/RA.  [name] Coronary artery disease, non-occlusive     a. 03/2010 Nonocclusive disease by cath, performed for ST elevations on ECG;  b. 06/2013 Lexi MV: EF 60%, no ischemia.  . Cervical radiculopathy due to degenerative joint disease of spine   . Claudication     a. lower ext dopplers 08/18/11-normal ABIs bilaterally with normal triphasic waveforms  . HTN (hypertension)     takes Diltiazem daily  . Dysrhythmia     HX OF ATRIAL IFB /FLUTTER takes Flecanide and Coumadin daily  . History of bronchitis     1998  . Weakness     numbness and tingling both hands  . Joint pain   . History of gastric ulcer   . Anemia     takes Ferrous Sulfate daily  .  History of blood transfusion 1982    "when I had stomach OR"  . Balance problem 01/2014  . Diabetes mellitus type II     takes Metformin and Lantus daily  . Pneumonia 1999  . CAP (community acquired pneumonia) 09/18/2014  . Arthritis     "all over"  . COPD (chronic obstructive pulmonary disease)     Past Surgical History  Procedure Laterality Date  . Atrial ablation surgery  08/05/10    CTI ablation for atrial flutter by JA  . Cardioversion  12/07/2010     Successful direct current cardioversion with atrial fibrillation to normal sinus rhythm  .  Partial gastrectomy  1982    subtotal; "took out 30% for ulcers"  . Nm myoview ltd  07/22/2013    Normal EF ~60%, no ischemia or infarction.  . Colonoscopy [name] 12/02/2013    Procedure: COLONOSCOPY;  Surgeon: Irene Shipper, MD;  Location: [place];  Service: Endoscopy;  Laterality: [name];  . Cardiac catheterization  2012/2013/2015    nl LV function, no occlusive CAD, PAF  . Knee bursectomy Left 1998  . Incision and drainage abscess / hematoma of bursa / knee / thigh Left 1998    knee  . Cataract extraction w/ intraocular lens  implant, bilateral Bilateral   . Yag laser application Bilateral   . Carpal tunnel release Bilateral 01/30/2014    Procedure: BILATERAL CARPAL TUNNEL RELEASE;  Surgeon: Marianna Payment, MD;  Location: [place];  Service: Orthopedics;  Laterality: Bilateral;  . Transthoracic echocardiogram  02/16/2014    EF 60%, no RWMA. - otherwise normal  . Laparoscopic cholecystectomy  03/2010    Prior to Admission medications   Medication Sig Start Date End Date Taking? Authorizing Provider  albuterol (PROVENTIL HFA;VENTOLIN HFA) 108 (90 BASE) MCG/ACT inhaler Inhale 2 puffs into the lungs every 4 (four) hours as needed for wheezing or shortness of breath. 09/14/14  Yes Sherwood Gambler, MD  aspirin EC 81 MG tablet Take 81 mg by mouth daily.   Yes Historical Provider, MD  diltiazem (CARDIZEM CD) 120 MG 24 hr capsule Take 1 capsule (120 mg total) by mouth daily. 04/03/14  Yes Erlene Quan, PA-C  ferrous sulfate 325 (65 FE) MG tablet Take 325 mg by mouth daily with breakfast.   Yes Historical Provider, MD  flecainide (TAMBOCOR) 150 MG tablet Take 1.5 tablets (225 mg total) by mouth 2 (two) times daily. 06/23/14  Yes Leonie Man, MD  hydrocortisone cream 1 % Apply 1 application topically 2 (two) times daily as needed for itching (rash).    Yes Historical Provider, MD  LANTUS SOLOSTAR 100 UNIT/ML Solostar Pen Inject 8 Units as directed 2 (two) times daily. Inject 8 units twice a day 08/31/14   Yes Historical Provider, MD  loratadine (CLARITIN) 10 MG tablet Take 10 mg by mouth 2 (two) times daily.    Yes Historical Provider, MD  metFORMIN (GLUCOPHAGE) 1000 MG tablet Take 500 mg by mouth 2 (two) times daily with a meal.    Yes Historical Provider, MD  metoprolol succinate (TOPROL XL) 25 MG 24 hr tablet Take 0.5 tablets (12.5 mg total) by mouth daily. Patient taking differently: Take 25 mg by mouth daily.  05/22/14  Yes Leonie Man, MD  Multiple Vitamin (MULTIVITAMIN WITH MINERALS) TABS tablet Take 1 tablet by mouth daily.   Yes Historical Provider, MD  nitroGLYCERIN (NITROSTAT) 0.4 MG SL tablet Place 1 tablet (0.4 mg total) under the tongue every 5 (five) minutes  x 3 doses as needed for chest pain. 05/22/14  Yes Leonie Man, MD  polyethylene glycol powder [facility]) powder 6 capfuls tomorrow morning. Then 3 capfuls three times per day for 3 days. Then 1 capful three times per day for 3 days. Then 1 capful per day for 7 days. 08/27/14  Yes Addison Lank, MD  pravastatin (PRAVACHOL) 40 MG tablet Take 40 mg by mouth daily at 6 PM.    Yes Historical Provider, MD  predniSONE (DELTASONE) 20 MG tablet 2 tabs po daily x 4 days 09/14/14  Yes Sherwood Gambler, MD  tamsulosin (FLOMAX) 0.4 MG CAPS capsule Take 0.4 mg by mouth daily.  05/22/14 05/22/15 Yes Historical Provider, MD  warfarin (COUMADIN) 5 MG tablet Take 7.5-10 mg by mouth daily at 6 PM. Take 2 tablet (10 mg) on Friday, take 1 1/2 tablets (7.5 mg) on Tuesday, Wednesday, Thursday, Saturday, Sunday, Monday   Yes Historical Provider, MD    Current Facility-Administered Medications  Medication Dose Route Frequency Provider Last Rate Last Dose  . acetaminophen (TYLENOL) tablet 650 mg  650 mg Oral Q6H PRN Oswald Hillock, MD      . alum & mag hydroxide-simeth (MAALOX/MYLANTA) 200-200-20 MG/5ML suspension 30 mL  30 mL Oral Q6H PRN Florencia Reasons, MD      . aspirin EC tablet 81 mg  81 mg Oral Daily Oswald Hillock, MD   81 mg at 09/21/14 1100  . azithromycin  (ZITHROMAX) 500 mg in dextrose 5 % 250 mL IVPB  500 mg Intravenous Q24H Oswald Hillock, MD   500 mg at 09/21/14 0546  . cefTRIAXone (ROCEPHIN) 1 g in dextrose 5 % 50 mL IVPB - Premix  1 g Intravenous Q24H Jake Church Masters, RPH   1 g at 09/21/14 1302  . diltiazem (CARDIZEM CD) 24 hr capsule 120 mg  120 mg Oral Daily Oswald Hillock, MD   120 mg at 09/21/14 1100  . famotidine (PEPCID) 40 MG/5ML suspension 20 mg  20 mg Oral Daily Florencia Reasons, MD   20 mg at 09/21/14 1100  . flecainide (TAMBOCOR) tablet 225 mg  225 mg Oral BID Oswald Hillock, MD   225 mg at 09/21/14 1100  . guaiFENesin (MUCINEX) 12 hr tablet 600 mg  600 mg Oral BID Florencia Reasons, MD   600 mg at 09/21/14 1100  . insulin aspart (novoLOG) injection 0-9 Units  0-9 Units Subcutaneous TID WC Oswald Hillock, MD   1 Units at 09/21/14 1252  . insulin glargine (LANTUS) injection 8 Units  8 Units Subcutaneous BID Oswald Hillock, MD   8 Units at 09/21/14 1100  . ipratropium (ATROVENT) nebulizer solution 0.5 mg  0.5 mg Nebulization Q6H Florencia Reasons, MD   0.5 mg at 09/21/14 1327  . lactobacillus (FLORANEX/LACTINEX) granules 1 g  1 g Oral TID WC Florencia Reasons, MD   1 g at 09/21/14 1251  . levalbuterol (XOPENEX) nebulizer solution 1.25 mg  1.25 mg Nebulization 4 times per day Florencia Reasons, MD   1.25 mg at 09/21/14 1327  . loratadine (CLARITIN) tablet 10 mg  10 mg Oral BID Oswald Hillock, MD   10 mg at 09/21/14 1100  . metoprolol succinate (TOPROL-XL) 24 hr tablet 12.5 mg  12.5 mg Oral Daily Oswald Hillock, MD   12.5 mg at 09/21/14 1100  . nicotine (NICODERM CQ - dosed in mg/24 hours) patch 14 mg  14 mg Transdermal Daily Florencia Reasons, MD   14 mg at  09/21/14 1100  . oxyCODONE-acetaminophen (PERCOCET/ROXICET) 5-325 MG per tablet 1 tablet  1 tablet Oral Q6H PRN Oswald Hillock, MD   1 tablet at 09/21/14 1124  . pantoprazole (PROTONIX) EC tablet 20 mg  20 mg Oral Daily Florencia Reasons, MD   20 mg at 09/21/14 1100  . polyethylene glycol (MIRALAX / GLYCOLAX) packet 17 g  17 g Oral Daily Florencia Reasons, MD   17 g at 09/21/14  1100  . pravastatin (PRAVACHOL) tablet 40 mg  40 mg Oral q1800 Oswald Hillock, MD   40 mg at 09/20/14 1820  . senna-docusate (Senokot-S) tablet 2 tablet  2 tablet Oral BID Florencia Reasons, MD   2 tablet at 09/21/14 1100  . [START ON 09/25/2014] warfarin (COUMADIN) tablet 10 mg  10 mg Oral Once per day on Fri Reginia Naas, RPH      . warfarin (COUMADIN) tablet 7.5 mg  7.5 mg Oral ONCE-1800 Jake Church Masters, [facility]      . Warfarin - Pharmacist Dosing Inpatient   Does not apply q1800 Wendee Beavers, RPH   0  at 09/18/14 1800    Allergies as of 09/18/2014  . (No Known Allergies)    Family History  Problem Relation Age of Onset  . Cancer Mother   . Heart attack Father   . Heart attack Brother     History   Social History  . Marital Status: Widowed    Spouse Name: [name]  . Number of Children: [name]  . Years of Education: [name]   Occupational History  . Not on file.   Social History Main Topics  . Smoking status: Current Every Day Smoker -- 0.50 packs/day for 55 years    Types: Cigarettes  . Smokeless tobacco: Never Used     Comment: He's been smoking between 0.5 and 2 ppd since age 15.  [name] Alcohol Use: 0.0 oz/week    0 Standard drinks or equivalent per week     Comment: "quit drinking in 1986"  . Drug Use: No  . Sexual Activity: No   Other Topics Concern  . Not on file   Social History Narrative   He is a widower, who recently moved to [place] back in 2011 to live closer to his daughter. He formerly lived in [place]. He is a father 88, grandfather, and great-grandfather of 66. Unfortunately as I mentioned he's gone back to smoking. Smoking about a half pack a day now. He is not very active, but does try to get outside and walk some. He does not drink alcohol.    Review of Systems: Negative for anorexia, weight loss, dysphagia, upper abdominal pain, diarrhea. 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 No other skin problems, no chest pain, no chronic pulmonary symptoms despite his smoking history. Does have urgency of urination related to prostate and bladder problems.. He has arthritis "all over" but doesn't really seem to have significant limitations due to joint pain.  Physical Exam: Vital signs in last 24 hours: Temp:  [97.5 F (36.4 C)-98.2 F (36.8 C)] 97.5 F (36.4 C) (06/27 0832) Pulse Rate:  [68-87] 86 (06/27 0832) Resp:  [16-18] 17 (06/27 0832) BP: (111-139)/(44-85) 139/85 mmHg (06/27 0832) SpO2:  [95 %-100 %] 100 % (06/27 0832) Weight:  [93.4 kg (205 lb  14.6 oz)] 93.4 kg (205 lb 14.6 oz) (06/26 2038) Last BM Date: 09/20/14 General:   Alert,  Well-developed, significantly overweight, pleasant and cooperative in NAD Head:  Normocephalic and atraumatic. Eyes:  Sclera clear, no icterus.   Conjunctiva pink. Mouth:   No ulcerations or lesions.  Oropharynx pink & moist. Neck:   No masses or thyromegaly. Lungs:  Fairly good air movement. However, he does have end expiratory wheezes bilaterally. No rales. Heart:   Regular rate and rhythm; no murmurs, clicks, rubs,  or gallops. Abdomen:  Severely obese with a rotund, somewhat firm panniculus. There is tympany all in the right and midportion of the abdomen, worse the left lower quadrant is somewhat dull or. Bowel sounds are normal and preserved Rectal:  Enlarged smooth prostate without masses, generous amount of soft but not mushy stool, no visible blood, no rectal masses   Msk:   Symmetrical without gross deformities. Pulses:  Normal radial pulse is noted. Extremities:   Without clubbing, cyanosis, or edema. Neurologic:  Alert and coherent;  grossly normal neurologically. Skin:  Intact without significant lesions or rashes. Cervical Nodes:  No significant cervical adenopathy. Psych:   Alert and cooperative. Normal mood and  affect.  Intake/Output from previous day: 06/26 0701 - 06/27 0700 In: 2370 [P.O.:1520; I.V.:800; IV Piggyback:50] Out: 1051 [Urine:1050; Stool:1] Intake/Output this shift: Total I/O In: 480 [P.O.:480] Out: 775 [Urine:775]  Lab Results:  Recent Labs  09/20/14 0331 09/20/14 1135 09/21/14 0430  WBC 6.3 7.0 6.9  HGB 8.0* 8.4* 8.6*  HCT 26.4* 28.1* 29.4*  PLT 247 270 289   BMET  Recent Labs  09/19/14 0250 09/20/14 0331 09/21/14 0430  NA 139 142 141  K 3.8 3.8 3.7  CL 109 110 111  CO2 25 27 25   GLUCOSE 102* 102* 112*  BUN 9 9 9   CREATININE 0.97 1.08 1.03  CALCIUM 7.9* 8.0* 8.3*   LFT  Recent Labs  09/19/14 0250  PROT 4.9*  ALBUMIN 2.8*  AST 22  ALT 19  ALKPHOS 47  BILITOT 0.2*   PT/INR  Recent Labs  09/20/14 0331 09/21/14 0430  LABPROT 26.3* 23.6*  INR 2.45* 2.13*    Studies/Results: No results found.  Impression: 1. Recurring left lower quadrant pain. Etiology unclear, but I wonder about sigmoid spasms related to fecal obstipation 2. Heme positive stool 3. Anemia, with 4% iron saturation but normal MCV (86). 4. History of colonoscopy a year ago by Dr. Henrene Pastor for rectal bleeding, with mild diverticulosis in the sigmoid region noted at that time, also a solitary diminutive adenomatous polyp in the rectum 5. Tendency toward constipation 6. Left-sided pneumonia, under treatment 7. Remote history of partial gastrectomy 34 years ago for ulcer disease 8. Recent onset reflux symptomatology  Plan: 1.  Agree with use of MiraLAX for fecal obstipation which could be contributing to his left lower quadrant pain. I reviewed his CT scan and it did look like there was quite a bit of stool throughout the colon, not to mention what I felt on rectal exam today 2. Endoscopic evaluation tomorrow. Petra Kuba, purpose, risks reviewed, patient agreeable. Okay to do while fully anticoagulated, as long as he is in the therapeutic range as he currently is. Look for  aspirin-induced ulceration, or gastric stump cancer, for which he is at increased risk in view of being many years status post partial gastric resection. 3. Check ferritin level to try to further discern whether the patient truly has iron deficiency anemia. 4. I don't think  the patient will need colonoscopy while in the hospital, and perhaps not at all, considering that his previous colonoscopy was less than a year ago   LOS: 3 days   Jasimine Simms V  09/21/2014, 3:29 PM   Pager [phone] If no answer or after 5 PM call [phone]

## 2014-09-21 NOTE — Care Management (Signed)
Important Message  Patient Details  Name: [name] MRN: [id number] Date of Birth: Jan 07, 1941   Medicare Important Message Given:  Yes-second notification given    Delorse Lek 09/21/2014, 3:48 PM

## 2014-09-21 NOTE — Progress Notes (Signed)
[name] for Coumadin Indication: atrial fibrillation  No Known Allergies  Patient Measurements: Height: 5\' 6"  (167.6 cm) Weight: 205 lb 14.6 oz (93.4 kg) IBW/kg (Calculated) : 63.8  Vital Signs: Temp: 97.5 F (36.4 C) (06/27 0832) Temp Source: Oral (06/27 0832) BP: 139/85 mmHg (06/27 0832) Pulse Rate: 86 (06/27 0832)  Labs:  Recent Labs  09/19/14 0250 09/20/14 0331 09/20/14 1135 09/21/14 0430  HGB 7.7* 8.0* 8.4* 8.6*  HCT 25.8* 26.4* 28.1* 29.4*  PLT 237 247 270 289  LABPROT 28.3* 26.3*  --  23.6*  INR 2.70* 2.45*  --  2.13*  CREATININE 0.97 1.08  --  1.03    Estimated Creatinine Clearance: 66.3 mL/min (by C-G formula based on Cr of 1.03).  Assessment: 74 y.o male with h/o PAF s/p ablation on coumadin PTA. Presented to ED with chief complaint of abdominal pain. Current INR is therapeutic at 2.13, hgb low 8.6, plts wnl.   PTA warfarin: 10 mg qFriday, 7.5 mg all other days   Goal of Therapy:  INR 2-3 Monitor platelets by anticoagulation protocol: Yes   Plan:  -Warfarin 7.5 mg po x1 -Daily INR -Monitor for s/sx bleeding    Hughes Better, PharmD, BCPS Clinical Pharmacist Pager: [phone] 09/21/2014 9:39 AM

## 2014-09-21 NOTE — Progress Notes (Signed)
PROGRESS NOTE  [name] CHY:[id number] DOB: Aug 18, 1940 DOA: 09/18/2014 PCP: [name], MD  HPI/Recap of past 24 hours:  reported left sided ab pain much improved, still need prn pain meds though. No n/v.  less cough, reported breathing treatment helped.  on room air, no respiratory distress.  Assessment/Plan: Active Problems:   Coronary artery disease, non-occlusive   DM (diabetes mellitus), type 2   Long term current use of anticoagulant therapy   Tobacco use disorder   CAP (community acquired pneumonia)   Pneumonia  Normalcytic anemia, likely multifactorial including anemia of chronic disease, and iron deficiency. acute on chronic, no obvious sign of blood loss.  Consider transfusion if hgb less than 7 or symptomatic. Denies dizziness, no chest pain, no weakness, no DOE. anemia work up pending: lft wnl, less likely hemolysis. B12/folate wnl. retic inappropriately low.  +iron deficiency, Remote history of peptic ulcer requiring partial gastrectomy. Denies blood in stool, denies epigastric pain, no n/v. +FOBT . Patient agreed with iv iron, also agreed with GI consult, GI paged. (caveat; patient is on coumadin)  Left lower quadrant abdominal pain, reported intermittent for the last several weeks. ? Cause. ? Referred pain from pneumonia, constipation? CT abdomen pelvis is negative for significant lesion. He does have a history of diverticulosis but no diverticulitis on the CT abdomen pelvis. Non obstruction stone on lower pole of right kidney, UA, no blood. Continue pain medications  Last colonoscopy in 11/2013: ENDOSCOPIC IMPRESSION: 1. Diminutive polyp was found in the rectum; polypectomy was performed with a cold snare 2. Mild diverticulosis was noted in the left colon 3. The colon mucosa was otherwise normal . Moderate internal Hemoorhoids as source of chronic minor rectal bleeding RECOMMENDATIONS: 1. AnuPneumonia, likely community acquired with background  copd Patient chest x-ray shows Patchy bilateral airspace opacities, more prominent at the left lung base, raise concern for multifocal pneumonia. Underlying peribronchial thickening noted. IV Rocephin and Zithromax. Add nebs and mucolytics. urinary strep pneumo antigen negative, Legionella antigen pending. blood cultures 2 pending, sputum culture pending collection sol HC supp. prn 2. Okay to resume Coumadin if medically necessary.Return to the care of your primary provider. GI follow up as needed   Pneumonia, likely community acquired with background copd Patient chest x-ray shows Patchy bilateral airspace opacities, more prominent at the left lung base, raise concern for multifocal pneumonia. Underlying peribronchial thickening noted. IV Rocephin and Zithromax. Add nebs and mucolytics. urinary strep pneumo antigen negative, Legionella antigen pending. blood cultures 2 pending, sputum culture pending collection  Insulin dependent Diabetes mellitus Hold metformin, initiate sliding scale insulin with NovoLog, continue home meds lantus a1c 5.8  History of paroxysmal A. Fib/aflutter s/p ablation Continue flecainide and metoprolol. ekg sinus rhythm with first degree av block. Continue warfarin per pharmacy consultation. INR therapeutic.  Smoker: smoking cessation education provided, agreed with nicotine patch .  Elevated tsh, free t4 wnl  Lactic acidosis: resolved.  Constipation: stool softener, wonder if constipation contribute to ab pain. Still c/o left sided ab pain, GI paged on 6/27. Consider repeat CT ab if pain persist.  Code Status: full  Family Communication: patient  Disposition Plan: remain inpatient   Consultants:  [facility]  Procedures:  none  Antibiotics:  Rocephin/zithro from admission   Objective: BP 139/85 mmHg  Pulse 86  Temp(Src) 97.5 F (36.4 C) (Oral)  Resp 17  Ht 5\' 6"  (1.676 m)  Wt 93.4 kg (205 lb 14.6 oz)  BMI 33.25 kg/m2  SpO2  100%  Intake/Output Summary (  Last 24 hours) at 09/21/14 1342 Last data filed at 09/21/14 1141  Gross per 24 hour  Intake   1810 ml  Output   1326 ml  Net    484 ml   Filed Weights   09/18/14 2036 09/19/14 2133 09/20/14 2038  Weight: 90.5 kg (199 lb 8.3 oz) 91.2 kg (201 lb 1 oz) 93.4 kg (205 lb 14.6 oz)    Exam:   General:  NAD  Cardiovascular: RRR  Respiratory: improved areation, prolonged expiratory phase, no wheezes. No rales. Intermittent mild rhonchi.  Abdomen: left sided tender, no guarding, Soft/ND, positive BS, old well healed mid line surgical scar.  Musculoskeletal: No Edema  Neuro: aaox3, no focal deficit  Data Reviewed: Basic Metabolic Panel:  Recent Labs Lab 09/14/14 1737 09/18/14 0147 09/18/14 0207 09/19/14 0250 09/20/14 0331 09/21/14 0430  NA 137 142 142 139 142 141  K 4.0 3.8 3.9 3.8 3.8 3.7  CL 101 105 105 109 110 111  CO2 25 24  --  25 27 25   GLUCOSE 148* 118* 112* 102* 102* 112*  BUN 15 13 17 9 9 9   CREATININE 1.17 1.09 1.20 0.97 1.08 1.03  CALCIUM 9.0 8.9  --  7.9* 8.0* 8.3*  MG  --   --   --   --  2.1 2.0   Liver Function Tests:  Recent Labs Lab 09/18/14 0147 09/19/14 0250  AST 28 22  ALT 22 19  ALKPHOS 53 47  BILITOT 0.3 0.2*  PROT 5.8* 4.9*  ALBUMIN 3.4* 2.8*    Recent Labs Lab 09/18/14 0147  LIPASE 29   No results for input(s): AMMONIA in the last 168 hours. CBC:  Recent Labs Lab 09/18/14 0147 09/18/14 0207 09/19/14 0250 09/20/14 0331 09/20/14 1135 09/21/14 0430  WBC 9.3  --  6.9 6.3 7.0 6.9  NEUTROABS 6.1  --   --   --   --   --   HGB 9.7* 10.9* 7.7* 8.0* 8.4* 8.6*  HCT 31.9* 32.0* 25.8* 26.4* 28.1* 29.4*  MCV 86.0  --  86.3 85.2 85.4 86.2  PLT 292  --  237 247 270 289   Cardiac Enzymes:   No results for input(s): CKTOTAL, CKMB, CKMBINDEX, TROPONINI in the last 168 hours. BNP (last 3 results)  Recent Labs  03/21/14 0324 08/07/14 2327 09/14/14 1737  BNP 40.1 47.8 59.2    ProBNP (last 3  results)  Recent Labs  01/08/14 0930  PROBNP 105.2    CBG:  Recent Labs Lab 09/20/14 1146 09/20/14 1831 09/20/14 2037 09/21/14 0731 09/21/14 1136  GLUCAP 126* 154* 94 140* 127*    Recent Results (from the past 240 hour(s))  Culture, blood (routine x 2)     Status: None (Preliminary result)   Collection Time: 09/18/14  6:30 AM  Result Value Ref Range Status   Specimen Description BLOOD RIGHT ARM  Final   Special Requests BOTTLES DRAWN AEROBIC AND ANAEROBIC 5CC  Final   Culture NO GROWTH 2 DAYS  Final   Report Status PENDING  Incomplete  Culture, blood (routine x 2)     Status: None (Preliminary result)   Collection Time: 09/18/14  6:30 AM  Result Value Ref Range Status   Specimen Description BLOOD RIGHT ANTECUBITAL  Final   Special Requests BOTTLES DRAWN AEROBIC AND ANAEROBIC 4CC  Final   Culture NO GROWTH 2 DAYS  Final   Report Status PENDING  Incomplete  MRSA PCR Screening     Status: None  Collection Time: 09/19/14  1:05 PM  Result Value Ref Range Status   MRSA by PCR NEGATIVE NEGATIVE Final    Comment:        The GeneXpert MRSA Assay (FDA approved for NASAL specimens only), is one component of a comprehensive MRSA colonization surveillance program. It is not intended to diagnose MRSA infection nor to guide or monitor treatment for MRSA infections.      Studies: No results found.  Scheduled Meds: . aspirin EC  81 mg Oral Daily  . azithromycin  500 mg Intravenous Q24H  . cefTRIAXone (ROCEPHIN)  IV  1 g Intravenous Q24H  . diltiazem  120 mg Oral Daily  . famotidine  20 mg Oral Daily  . flecainide  225 mg Oral BID  . guaiFENesin  600 mg Oral BID  . insulin aspart  0-9 Units Subcutaneous TID WC  . insulin glargine  8 Units Subcutaneous BID  . ipratropium  0.5 mg Nebulization Q6H  . lactobacillus  1 g Oral TID WC  . levalbuterol  1.25 mg Nebulization 4 times per day  . loratadine  10 mg Oral BID  . metoprolol succinate  12.5 mg Oral Daily  .  nicotine  14 mg Transdermal Daily  . pantoprazole  20 mg Oral Daily  . polyethylene glycol  17 g Oral Daily  . pravastatin  40 mg Oral q1800  . senna-docusate  2 tablet Oral BID  . [START ON 09/25/2014] warfarin  10 mg Oral Once per day on Fri  . warfarin  7.5 mg Oral ONCE-1800  . Warfarin - Pharmacist Dosing Inpatient   Does not apply q1800    Continuous Infusions: ivf stopped on 6/27     Time spent: 63mins  Kenny Rea MD, PhD  [facility] Pager [phone]. If 7PM-7AM, please contact night-coverage at [email or web address], password [facility] 09/21/2014, 1:42 PM  LOS: 3 days

## 2014-09-22 ENCOUNTER — Inpatient Hospital Stay (HOSPITAL_COMMUNITY): Payer: Medicare Other

## 2014-09-22 ENCOUNTER — Encounter (HOSPITAL_COMMUNITY)
Admission: EM | Disposition: A | Discharge status: Home / Self Care | Payer: Self-pay | Source: Home / Self Care | Attending: Internal Medicine

## 2014-09-22 ENCOUNTER — Encounter (HOSPITAL_COMMUNITY): Payer: Self-pay | Admitting: *Deleted

## 2014-09-22 HISTORY — PX: ESOPHAGOGASTRODUODENOSCOPY: SHX5428

## 2014-09-22 LAB — BASIC METABOLIC PANEL
ANION GAP: 4 — AB (ref 5–15)
BUN: 9 mg/dL (ref 6–20)
CALCIUM: 7.8 mg/dL — AB (ref 8.9–10.3)
CO2: 24 mmol/L (ref 22–32)
Chloride: 108 mmol/L (ref 101–111)
Creatinine, Ser: 0.9 mg/dL (ref 0.61–1.24)
GFR calc Af Amer: >60 mL/min (ref >60)
Glucose, Bld: 180 mg/dL — ABNORMAL HIGH (ref 65–99)
Potassium: 3.6 mmol/L (ref 3.5–5.1)
Sodium: 136 mmol/L (ref 135–145)

## 2014-09-22 LAB — MAGNESIUM: MAGNESIUM: 1.9 mg/dL (ref 1.7–2.4)

## 2014-09-22 LAB — URINALYSIS, ROUTINE W REFLEX MICROSCOPIC
Bilirubin Urine: NEGATIVE
GLUCOSE, UA: NEGATIVE mg/dL
HGB URINE DIPSTICK: NEGATIVE
KETONES UR: NEGATIVE mg/dL
LEUKOCYTES UA: NEGATIVE
Nitrite: NEGATIVE
Protein, ur: NEGATIVE mg/dL
Specific Gravity, Urine: 1.007 (ref 1.005–1.030)
Urobilinogen, UA: 0.2 mg/dL (ref 0.0–1.0)
pH: 7.5 (ref 5.0–8.0)

## 2014-09-22 LAB — CBC
HEMATOCRIT: 27.7 % — AB (ref 39.0–52.0)
HEMOGLOBIN: 8.3 g/dL — AB (ref 13.0–17.0)
MCH: 25.6 pg — ABNORMAL LOW (ref 26.0–34.0)
MCHC: 30 g/dL (ref 30.0–36.0)
MCV: 85.5 fL (ref 78.0–100.0)
Platelets: 264 10*3/uL (ref 150–400)
RBC: 3.24 MIL/uL — ABNORMAL LOW (ref 4.22–5.81)
RDW: 14.2 % (ref 11.5–15.5)
WBC: 6.7 10*3/uL (ref 4.0–10.5)

## 2014-09-22 LAB — GLUCOSE, CAPILLARY
GLUCOSE-CAPILLARY: 95 mg/dL (ref 65–99)
GLUCOSE-CAPILLARY: 86 mg/dL (ref 65–99)
GLUCOSE-CAPILLARY: 97 mg/dL (ref 65–99)
Glucose-Capillary: 157 mg/dL — ABNORMAL HIGH (ref 65–99)
Glucose-Capillary: 116 mg/dL — ABNORMAL HIGH (ref 65–99)
Glucose-Capillary: 175 mg/dL — ABNORMAL HIGH (ref 65–99)

## 2014-09-22 LAB — PROTIME-INR
INR: 2.42 — AB (ref 0.00–1.49)
PROTHROMBIN TIME: 26 s — AB (ref 11.6–15.2)

## 2014-09-22 LAB — FERRITIN: Ferritin: 5 ng/mL — ABNORMAL LOW (ref 24–336)

## 2014-09-22 SURGERY — EGD (ESOPHAGOGASTRODUODENOSCOPY)
Anesthesia: Moderate Sedation

## 2014-09-22 MED ORDER — PANTOPRAZOLE SODIUM 40 MG PO TBEC
40.0000 mg | DELAYED_RELEASE_TABLET | Freq: Every day | ORAL | Status: DC
  Administered 2014-09-22 – 2014-09-24 (×3): 40 mg via ORAL
  Filled 2014-09-22 (×2): qty 1

## 2014-09-22 MED ORDER — IOHEXOL 300 MG/ML  SOLN
100.0000 mL | Freq: Once | INTRAMUSCULAR | Status: AC | PRN
  Administered 2014-09-22: 100 mL via INTRAVENOUS

## 2014-09-22 MED ORDER — SODIUM CHLORIDE 0.9 % IV SOLN
1000.0000 mg | Freq: Once | INTRAVENOUS | Status: AC
  Administered 2014-09-22: 1000 mg via INTRAVENOUS
  Filled 2014-09-22 (×2): qty 20

## 2014-09-22 MED ORDER — SODIUM CHLORIDE 0.9 % IV SOLN
25.0000 mg | Freq: Once | INTRAVENOUS | Status: AC
  Administered 2014-09-22: 25 mg via INTRAVENOUS
  Filled 2014-09-22: qty 0.5

## 2014-09-22 MED ORDER — SODIUM CHLORIDE 0.9 % IV SOLN
INTRAVENOUS | Status: DC
  Administered 2014-09-22: 14:00:00 via INTRAVENOUS

## 2014-09-22 MED ORDER — DIPHENHYDRAMINE HCL 50 MG/ML IJ SOLN
INTRAMUSCULAR | Status: AC
  Filled 2014-09-22: qty 1

## 2014-09-22 MED ORDER — IOHEXOL 300 MG/ML  SOLN
25.0000 mL | INTRAMUSCULAR | Status: AC
  Administered 2014-09-22 (×2): 25 mL via ORAL

## 2014-09-22 MED ORDER — FENTANYL CITRATE (PF) 100 MCG/2ML IJ SOLN
INTRAMUSCULAR | Status: AC
  Filled 2014-09-22: qty 2

## 2014-09-22 MED ORDER — MORPHINE SULFATE 2 MG/ML IJ SOLN
1.0000 mg | INTRAMUSCULAR | Status: DC | PRN
  Administered 2014-09-22 (×2): 1 mg via INTRAVENOUS
  Filled 2014-09-22 (×2): qty 1

## 2014-09-22 MED ORDER — FENTANYL CITRATE (PF) 100 MCG/2ML IJ SOLN
INTRAMUSCULAR | Status: DC | PRN
  Administered 2014-09-22 (×2): 25 ug via INTRAVENOUS

## 2014-09-22 MED ORDER — WARFARIN SODIUM 7.5 MG PO TABS
7.5000 mg | ORAL_TABLET | ORAL | Status: AC
  Administered 2014-09-22: 7.5 mg via ORAL
  Filled 2014-09-22: qty 1

## 2014-09-22 MED ORDER — MIDAZOLAM HCL 5 MG/ML IJ SOLN
INTRAMUSCULAR | Status: AC
  Filled 2014-09-22: qty 2

## 2014-09-22 MED ORDER — MIDAZOLAM HCL 10 MG/2ML IJ SOLN
INTRAMUSCULAR | Status: DC | PRN
  Administered 2014-09-22 (×2): 2 mg via INTRAVENOUS

## 2014-09-22 NOTE — Progress Notes (Signed)
[place] for Coumadin Indication: atrial fibrillation  No Known Allergies  Patient Measurements: Height: 5\' 6"  (167.6 cm) Weight: 208 lb 5.4 oz (94.5 kg) IBW/kg (Calculated) : 63.8  Vital Signs: Temp: 98 F (36.7 C) (06/28 0813) Temp Source: Oral (06/28 0813) BP: 110/55 mmHg (06/28 0813) Pulse Rate: 71 (06/28 0813)  Labs:  Recent Labs  09/20/14 0331 09/20/14 1135 09/21/14 0430 09/22/14 0700  HGB 8.0* 8.4* 8.6* 8.3*  HCT 26.4* 28.1* 29.4* 27.7*  PLT 247 270 289 264  LABPROT 26.3*  --  23.6* 26.0*  INR 2.45*  --  2.13* 2.42*  CREATININE 1.08  --  1.03 0.90    Estimated Creatinine Clearance: 76.3 mL/min (by C-G formula based on Cr of 0.9).  Assessment: 74 y.o male with h/o PAF s/p ablation on coumadin PTA. Presented to ED with chief complaint of abdominal pain. Current INR is therapeutic at 2.4, hgb low 8.3, plts wnl.   PTA warfarin: 10 mg qFriday, 7.5 mg all other days   Goal of Therapy:  INR 2-3 Monitor platelets by anticoagulation protocol: Yes   Plan:  -Resume home warfarin: 10 mg qFri, 7.5 mg all other days -Daily INR -Monitor for s/sx bleeding -F/u results of endoscopy     Hughes Better, PharmD, BCPS Clinical Pharmacist Pager: [phone] 09/22/2014 8:53 AM

## 2014-09-22 NOTE — Progress Notes (Signed)
PROGRESS NOTE  [name] JSH:[id number] DOB: [birth year] DOA: 09/18/2014 PCP: [name], MD  HPI/Recap of past 24 hours:  reported left sided ab pain radiating to left flank last 4hrs last night, pain improved with pain meds,  No n/v. Patient reported the pain has been going on for the last 3 month, ambulating without difficulty  less cough, reported breathing treatment helped.  on room air, no respiratory distress.  Assessment/Plan: Active Problems:   Coronary artery disease, non-occlusive   DM (diabetes mellitus), type 2   Long term current use of anticoagulant therapy   Tobacco use disorder   CAP (community acquired pneumonia)   Pneumonia  Normalcytic anemia, likely multifactorial including anemia of chronic disease, and iron deficiency. acute on chronic, no obvious sign of blood loss.  Consider transfusion if hgb less than 7 or symptomatic. Denies dizziness, no chest pain, no weakness, no DOE. anemia work up pending: lft wnl, less likely hemolysis. B12/folate wnl. retic inappropriately low.  +iron deficiency, Remote history of peptic ulcer requiring partial gastrectomy. Denies blood in stool, denies epigastric pain, no n/v. +FOBT . Patient agreed with iv iron, also agreed with GI consult,  s/p egd on 6/28  Left lower quadrant abdominal pain, reported intermittent for the last several months. ? Cause. ? Referred pain from pneumonia, constipation? Muscle spasm? CT abdomen pelvis is negative for significant lesion. He does have a history of diverticulosis but no diverticulitis on the CT abdomen pelvis. Non obstruction stone on lower pole of right kidney, UA, no blood. Continue pain medications  Last colonoscopy in 11/2013: ENDOSCOPIC IMPRESSION: 1. Diminutive polyp was found in the rectum; polypectomy was performed with a cold snare 2. Mild diverticulosis was noted in the left colon 3. The colon mucosa was otherwise normal . Moderate internal Hemoorhoids as source of  chronic minor rectal bleeding RECOMMENDATIONS: 1. AnuPneumonia, likely community acquired with background copd Patient chest x-ray shows Patchy bilateral airspace opacities, more prominent at the left lung base, raise concern for multifocal pneumonia. Underlying peribronchial thickening noted. IV Rocephin and Zithromax. Add nebs and mucolytics. urinary strep pneumo antigen negative, Legionella antigen pending. blood cultures 2 pending, sputum culture pending collection sol HC supp. prn 2. Okay to resume Coumadin if medically necessary.Return to the care of your primary provider. GI follow up as needed   Pneumonia, likely community acquired with background copd Patient chest x-ray shows Patchy bilateral airspace opacities, more prominent at the left lung base, raise concern for multifocal pneumonia. Underlying peribronchial thickening noted. IV Rocephin and Zithromax. Add nebs and mucolytics. urinary strep pneumo antigen negative, Legionella antigen negative. blood cultures 2 pending, sputum culture pending collection  Insulin dependent Diabetes mellitus Hold metformin, initiate sliding scale insulin with NovoLog, continue home meds lantus a1c 5.8  History of paroxysmal A. Fib/aflutter s/p ablation Continue flecainide and metoprolol. ekg sinus rhythm with first degree av block. Continue warfarin per pharmacy consultation. INR therapeutic.  Smoker: smoking cessation education provided, agreed with nicotine patch .  Elevated tsh, free t4 wnl  Lactic acidosis: resolved.  Constipation: stool softener, wonder if constipation contribute to ab pain. Still c/o left sided ab pain, persistent pain,  repeat CT ab/pel on 6/28 if negative, consider muscle spasm, though patient denies back pain reported never has problems with his back, hip or legs.  Code Status: full  Family Communication: patient  Disposition Plan: remain inpatient   Consultants:  [facility]  Procedures:  none  Antibiotics:  Rocephin/zithro from admission   Objective: BP  118/75 mmHg  Pulse 78  Temp(Src) 98 F (36.7 C) (Oral)  Resp 17  Ht 5\' 6"  (1.676 m)  Wt 94.5 kg (208 lb 5.4 oz)  BMI 33.64 kg/m2  SpO2 97%  Intake/Output Summary (Last 24 hours) at 09/22/14 1840 Last data filed at 09/22/14 1755  Gross per 24 hour  Intake    240 ml  Output   2100 ml  Net  -1860 ml   Filed Weights   09/19/14 2133 09/20/14 2038 09/21/14 2128  Weight: 91.2 kg (201 lb 1 oz) 93.4 kg (205 lb 14.6 oz) 94.5 kg (208 lb 5.4 oz)    Exam:   General:  NAD  Cardiovascular: RRR  Respiratory: improved areation, prolonged expiratory phase, no wheezes. No rales. Intermittent mild rhonchi.  Abdomen: left sided tender, no guarding, Soft/ND, positive BS, old well healed mid line surgical scar.  Musculoskeletal: No Edema  Neuro: aaox3, no focal deficit  Data Reviewed: Basic Metabolic Panel:  Recent Labs Lab 09/18/14 0147 09/18/14 0207 09/19/14 0250 09/20/14 0331 09/21/14 0430 09/22/14 0700  NA 142 142 139 142 141 136  K 3.8 3.9 3.8 3.8 3.7 3.6  CL 105 105 109 110 111 108  CO2 24  --  25 27 25 24   GLUCOSE 118* 112* 102* 102* 112* 180*  BUN 13 17 9 9 9 9   CREATININE 1.09 1.20 0.97 1.08 1.03 0.90  CALCIUM 8.9  --  7.9* 8.0* 8.3* 7.8*  MG  --   --   --  2.1 2.0 1.9   Liver Function Tests:  Recent Labs Lab 09/18/14 0147 09/19/14 0250  AST 28 22  ALT 22 19  ALKPHOS 53 47  BILITOT 0.3 0.2*  PROT 5.8* 4.9*  ALBUMIN 3.4* 2.8*    Recent Labs Lab 09/18/14 0147  LIPASE 29   No results for input(s): AMMONIA in the last 168 hours. CBC:  Recent Labs Lab 09/18/14 0147  09/19/14 0250 09/19/14 1221 09/20/14 0331 09/20/14 1135 09/21/14 0430 09/22/14 0700  WBC 9.3  --  6.9  --  6.3 7.0 6.9 6.7  NEUTROABS 6.1  --   --   --   --   --   --   --   HGB 9.7*  < > 7.7*  --  8.0* 8.4* 8.6* 8.3*  HCT 31.9*  < > 25.8* 29.7* 26.4* 28.1* 29.4* 27.7*  MCV 86.0  --   86.3  --  85.2 85.4 86.2 85.5  PLT 292  --  237  --  247 270 289 264  < > = values in this interval not displayed. Cardiac Enzymes:   No results for input(s): CKTOTAL, CKMB, CKMBINDEX, TROPONINI in the last 168 hours. BNP (last 3 results)  Recent Labs  03/21/14 0324 08/07/14 2327 09/14/14 1737  BNP 40.1 47.8 59.2    ProBNP (last 3 results)  Recent Labs  01/08/14 0930  PROBNP 105.2    CBG:  Recent Labs Lab 09/22/14 0739 09/22/14 1128 09/22/14 1355 09/22/14 1455 09/22/14 1622  GLUCAP 157* 95 86 116* 97    Recent Results (from the past 240 hour(s))  Culture, blood (routine x 2)     Status: None (Preliminary result)   Collection Time: 09/18/14  6:30 AM  Result Value Ref Range Status   Specimen Description BLOOD RIGHT ARM  Final   Special Requests BOTTLES DRAWN AEROBIC AND ANAEROBIC 5CC  Final   Culture NO GROWTH 4 DAYS  Final   Report Status PENDING  Incomplete  Culture,  blood (routine x 2)     Status: None (Preliminary result)   Collection Time: 09/18/14  6:30 AM  Result Value Ref Range Status   Specimen Description BLOOD RIGHT ANTECUBITAL  Final   Special Requests BOTTLES DRAWN AEROBIC AND ANAEROBIC 4CC  Final   Culture NO GROWTH 4 DAYS  Final   Report Status PENDING  Incomplete  MRSA PCR Screening     Status: None   Collection Time: 09/19/14  1:05 PM  Result Value Ref Range Status   MRSA by PCR NEGATIVE NEGATIVE Final    Comment:        The GeneXpert MRSA Assay (FDA approved for NASAL specimens only), is one component of a comprehensive MRSA colonization surveillance program. It is not intended to diagnose MRSA infection nor to guide or monitor treatment for MRSA infections.      Studies: No results found.  Scheduled Meds: . aspirin EC  81 mg Oral Daily  . azithromycin  500 mg Intravenous Q24H  . cefTRIAXone (ROCEPHIN)  IV  1 g Intravenous Q24H  . diltiazem  120 mg Oral Daily  . flecainide  225 mg Oral BID  . guaiFENesin  600 mg Oral BID  .  insulin aspart  0-9 Units Subcutaneous TID WC  . insulin glargine  8 Units Subcutaneous BID  . ipratropium  0.5 mg Nebulization Q6H  . lactobacillus  1 g Oral TID WC  . levalbuterol  1.25 mg Nebulization 4 times per day  . loratadine  10 mg Oral BID  . metoprolol succinate  12.5 mg Oral Daily  . nicotine  14 mg Transdermal Daily  . pantoprazole  40 mg Oral Daily  . polyethylene glycol  17 g Oral Daily  . pravastatin  40 mg Oral q1800  . senna-docusate  2 tablet Oral BID  . [START ON 09/25/2014] warfarin  10 mg Oral Once per day on Fri  . Warfarin - Pharmacist Dosing Inpatient   Does not apply q1800    Continuous Infusions: ivf stopped on 6/27     Time spent: 20mins  Levin Dagostino MD, PhD  [facility] Pager [phone]. If 7PM-7AM, please contact night-coverage at [email or web address], password [facility] 09/22/2014, 6:40 PM  LOS: 4 days

## 2014-09-22 NOTE — Progress Notes (Signed)
Endoscopy was well tolerated.  Please see dictated procedure report for significant findings and recommendations.  I believe this problem will be able to be followed adequately by his primary physician following discharge. I will be available, however, if he desires assistance in the patient's management.  I will sign off at this time. Call if needed.  Cleotis Nipper, M.D. Pager [phone] If no answer or after 5 PM call [phone]

## 2014-09-22 NOTE — Progress Notes (Signed)
MEDICATION RELATED CONSULT NOTE - INITIAL   Pharmacy Consult for IV iron x 1 Indication: Anemia  No Known Allergies  Patient Measurements: Height: 5\' 6"  (167.6 cm) Weight: 208 lb 5.4 oz (94.5 kg) IBW/kg (Calculated) : 63.8  Vital Signs: Temp: 98 F (36.7 C) (06/28 1624) Temp Source: Oral (06/28 1624) BP: 118/75 mmHg (06/28 1624) Pulse Rate: 78 (06/28 1624) Intake/Output from previous day: 06/27 0701 - 06/28 0700 In: 840 [P.O.:840] Out: 2175 [Urine:2175] Intake/Output from this shift:    Labs:  Recent Labs  09/20/14 0331 09/20/14 1135 09/21/14 0430 09/22/14 0700  WBC 6.3 7.0 6.9 6.7  HGB 8.0* 8.4* 8.6* 8.3*  HCT 26.4* 28.1* 29.4* 27.7*  PLT 247 270 289 264  CREATININE 1.08  --  1.03 0.90  MG 2.1  --  2.0 1.9   Estimated Creatinine Clearance: 76.3 mL/min (by C-G formula based on Cr of 0.9).   Microbiology: Recent Results (from the past 720 hour(s))  Culture, blood (routine x 2)     Status: None (Preliminary result)   Collection Time: 09/18/14  6:30 AM  Result Value Ref Range Status   Specimen Description BLOOD RIGHT ARM  Final   Special Requests BOTTLES DRAWN AEROBIC AND ANAEROBIC 5CC  Final   Culture NO GROWTH 4 DAYS  Final   Report Status PENDING  Incomplete  Culture, blood (routine x 2)     Status: None (Preliminary result)   Collection Time: 09/18/14  6:30 AM  Result Value Ref Range Status   Specimen Description BLOOD RIGHT ANTECUBITAL  Final   Special Requests BOTTLES DRAWN AEROBIC AND ANAEROBIC 4CC  Final   Culture NO GROWTH 4 DAYS  Final   Report Status PENDING  Incomplete  MRSA PCR Screening     Status: None   Collection Time: 09/19/14  1:05 PM  Result Value Ref Range Status   MRSA by PCR NEGATIVE NEGATIVE Final    Comment:        The GeneXpert MRSA Assay (FDA approved for NASAL specimens only), is one component of a comprehensive MRSA colonization surveillance program. It is not intended to diagnose MRSA infection nor to guide or monitor  treatment for MRSA infections.     Medical History: Past Medical History  Diagnosis Date  . Atrial flutter     a. 07/2010 Status post caval tricuspid isthmus ablation by Dr. Midge Aver Metoprolol daily  . PAF (paroxysmal atrial fibrillation)     a. Recurrent after atrial flutter, currently controlled on flecainide plus diltiazem  . Hyperlipidemia     takes Pravastatin daily  . Diastolic CHF, chronic     a. 12/2012 EF 55-60%, diast dysfxn, triv MR, mildly dil LA/RA.  [name] Coronary artery disease, non-occlusive     a. 03/2010 Nonocclusive disease by cath, performed for ST elevations on ECG;  b. 06/2013 Lexi MV: EF 60%, no ischemia.  . Cervical radiculopathy due to degenerative joint disease of spine   . Claudication     a. lower ext dopplers 08/18/11-normal ABIs bilaterally with normal triphasic waveforms  . HTN (hypertension)     takes Diltiazem daily  . Dysrhythmia     HX OF ATRIAL IFB /FLUTTER takes Flecanide and Coumadin daily  . History of bronchitis     1998  . Weakness     numbness and tingling both hands  . Joint pain   . History of gastric ulcer   . Anemia     takes Ferrous Sulfate daily  . History of blood transfusion  1982    "when I had stomach OR"  . Balance problem 01/2014  . Diabetes mellitus type II     takes Metformin and Lantus daily  . Pneumonia 1999  . CAP (community acquired pneumonia) 09/18/2014  . Arthritis     "all over"  . COPD (chronic obstructive pulmonary disease)      Assessment: 74 year old male on Coumadin prior to admission for Afib now with anemia.  Pharmacy asked to dose one dose of IV iron Calculated iron deficit close to 1000 mg   Plan:  Test dose of IV iron dextran at 25 mg Followed by dose of 1000 mg   Pharmacy to sign off  Thank you. Anette Guarneri, PharmD [phone]  09/22/2014,8:30 PM

## 2014-09-23 ENCOUNTER — Encounter (HOSPITAL_COMMUNITY): Payer: Self-pay | Admitting: Gastroenterology

## 2014-09-23 DIAGNOSIS — J189 Pneumonia, unspecified organism: Principal | ICD-10-CM

## 2014-09-23 DIAGNOSIS — Z7901 Long term (current) use of anticoagulants: Secondary | ICD-10-CM

## 2014-09-23 DIAGNOSIS — I2510 Atherosclerotic heart disease of native coronary artery without angina pectoris: Secondary | ICD-10-CM

## 2014-09-23 LAB — COMPREHENSIVE METABOLIC PANEL
ALT: 25 U/L (ref 17–63)
AST: 24 U/L (ref 15–41)
Albumin: 3.1 g/dL — ABNORMAL LOW (ref 3.5–5.0)
Alkaline Phosphatase: 55 U/L (ref 38–126)
Anion gap: 10 (ref 5–15)
BUN: 12 mg/dL (ref 6–20)
CALCIUM: 8.4 mg/dL — AB (ref 8.9–10.3)
CO2: 23 mmol/L (ref 22–32)
CREATININE: 1.02 mg/dL (ref 0.61–1.24)
Chloride: 104 mmol/L (ref 101–111)
GFR calc non Af Amer: >60 mL/min (ref >60)
Glucose, Bld: 118 mg/dL — ABNORMAL HIGH (ref 65–99)
Potassium: 4.1 mmol/L (ref 3.5–5.1)
Sodium: 137 mmol/L (ref 135–145)
Total Bilirubin: 0.8 mg/dL (ref 0.3–1.2)
Total Protein: 5.8 g/dL — ABNORMAL LOW (ref 6.5–8.1)

## 2014-09-23 LAB — CBC
HEMATOCRIT: 29.5 % — AB (ref 39.0–52.0)
HEMOGLOBIN: 9.1 g/dL — AB (ref 13.0–17.0)
MCH: 25.6 pg — AB (ref 26.0–34.0)
MCHC: 30.8 g/dL (ref 30.0–36.0)
MCV: 82.9 fL (ref 78.0–100.0)
Platelets: 330 10*3/uL (ref 150–400)
RBC: 3.56 MIL/uL — AB (ref 4.22–5.81)
RDW: 14.2 % (ref 11.5–15.5)
WBC: 7.8 10*3/uL (ref 4.0–10.5)

## 2014-09-23 LAB — LIPASE, BLOOD: LIPASE: 21 U/L — AB (ref 22–51)

## 2014-09-23 LAB — CULTURE, BLOOD (ROUTINE X 2)
CULTURE: NO GROWTH
Culture: NO GROWTH

## 2014-09-23 LAB — GLUCOSE, CAPILLARY
GLUCOSE-CAPILLARY: 172 mg/dL — AB (ref 65–99)
GLUCOSE-CAPILLARY: 127 mg/dL — AB (ref 65–99)
Glucose-Capillary: 137 mg/dL — ABNORMAL HIGH (ref 65–99)
Glucose-Capillary: 165 mg/dL — ABNORMAL HIGH (ref 65–99)
Glucose-Capillary: 125 mg/dL — ABNORMAL HIGH (ref 65–99)

## 2014-09-23 LAB — PROTIME-INR
INR: 2.12 — ABNORMAL HIGH (ref 0.00–1.49)
Prothrombin Time: 23.6 seconds — ABNORMAL HIGH (ref 11.6–15.2)

## 2014-09-23 LAB — MAGNESIUM: MAGNESIUM: 2.3 mg/dL (ref 1.7–2.4)

## 2014-09-23 MED ORDER — WARFARIN SODIUM 7.5 MG PO TABS
7.5000 mg | ORAL_TABLET | ORAL | Status: DC
  Administered 2014-09-23: 7.5 mg via ORAL
  Filled 2014-09-23 (×3): qty 1

## 2014-09-23 NOTE — Progress Notes (Signed)
[place] for Coumadin Indication: atrial fibrillation  No Known Allergies  Patient Measurements: Height: 5\' 6"  (167.6 cm) Weight: 208 lb 5.4 oz (94.5 kg) IBW/kg (Calculated) : 63.8  Vital Signs: Temp: 98.2 F (36.8 C) (06/29 0940) Temp Source: Oral (06/29 0940) BP: 114/66 mmHg (06/29 0836) Pulse Rate: 79 (06/29 0940)  Labs:  Recent Labs  09/21/14 0430 09/22/14 0700 09/23/14 0550 09/23/14 0745  HGB 8.6* 8.3*  --  9.1*  HCT 29.4* 27.7*  --  29.5*  PLT 289 264  --  330  LABPROT 23.6* 26.0* 23.6*  --   INR 2.13* 2.42* 2.12*  --   CREATININE 1.03 0.90 1.02  --     Estimated Creatinine Clearance: 67.4 mL/min (by C-G formula based on Cr of 1.02).  Assessment: 74 y.o male with h/o PAF s/p ablation on coumadin PTA. Presented to ED with chief complaint of abdominal pain. Current INR is therapeutic at 2.1, hgb low 9.1, plts wnl.   PTA warfarin: 10 mg qFriday, 7.5 mg all other days   Goal of Therapy:  INR 2-3 Monitor platelets by anticoagulation protocol: Yes   Plan:  -Resume home warfarin: 10 mg qFri, 7.5 mg all other days -Daily INR -Monitor for s/sx bleeding -F/u results of endoscopy     Hughes Better, PharmD, BCPS Clinical Pharmacist Pager: [phone] 09/23/2014 10:58 AM

## 2014-09-23 NOTE — Progress Notes (Addendum)
PROGRESS NOTE  Dwayne Bulkley WUJ:[id number] DOB: 1940-10-11 DOA: 09/18/2014 PCP: [name], MD  HPI/Recap of past 24 hours:  Patient sitting in bed, denies any headache chest or abdominal pain. No shortness of breath. Feels better today.  Assessment/Plan:  Iron deficiency anemia. GI following. The went EGD which was unremarkable formal report pending, per last colonoscopy he has internal hemorrhoids with minor rectal bleeding. Could be the source of anemia. Received IV iron, H&H stable. Follow with GI outpatient.    Left lower quadrant abdominal pain, reported intermittent for the last several months. ? Cause. ? Referred pain from pneumonia, constipation? Muscle spasm? - Much improved after bowel regimen and a bowel movement, continue supportive care. Currently pain resolved. Follow with GI outpatient.   CAP with background copd - clinically much improved with empiric IV Rocephin and azithromycin, continue supportive care with oxygen and laser treatments, urinary strep pneumo antigen negative, Legionella antigen negative. blood cultures and sputum culture negative thus far, chest pain resolved.   History of paroxysmal A. Fib/aflutter s/p ablation - Continue flecainide and metoprolol. ekg sinus rhythm with first degree av block. Continue warfarin per pharmacy consultation. INR therapeutic.   Smoker: smoking cessation education provided, agreed with nicotine patch .   Elevated tsh, free t4 wnl - repeat TSH in 4-6 weeks likely sick euthyroid.   Lactic acidosis: resolved.   Constipation: Resolved after bowel regimen had a large bowel movement feels fine.   Insulin dependent Diabetes mellitus Hold metformin, initiate sliding scale insulin with NovoLog, continue home meds lantus a1c 5.8  CBG (last 3)   Recent Labs  09/22/14 2156 09/23/14 0215 09/23/14 0741  GLUCAP 175* 137* 165*      Code Status: full  Family Communication: patient  Disposition Plan: remain  inpatient   Consultants:  [facility]  Procedures:   Anti-infectives    Start     Dose/Rate Route Frequency Ordered Stop   09/21/14 1400  cefTRIAXone (ROCEPHIN) 1 g in dextrose 5 % 50 mL IVPB - Premix     1 g 100 mL/hr over 30 Minutes Intravenous Every 24 hours 09/21/14 0945     09/19/14 1400  cefTRIAXone (ROCEPHIN) 2 g in dextrose 5 % 50 mL IVPB - Premix  Status:  Discontinued     2 g 100 mL/hr over 30 Minutes Intravenous Every 24 hours 09/19/14 1301 09/21/14 0945   09/19/14 0630  azithromycin (ZITHROMAX) 500 mg in dextrose 5 % 250 mL IVPB     500 mg 250 mL/hr over 60 Minutes Intravenous Every 24 hours 09/18/14 1158     09/18/14 0630  cefTRIAXone (ROCEPHIN) 2 g in dextrose 5 % 50 mL IVPB     2 g 100 mL/hr over 30 Minutes Intravenous  Once 09/18/14 0615 09/18/14 0717   09/18/14 0630  azithromycin (ZITHROMAX) tablet 500 mg     500 mg Oral  Once 09/18/14 0615 09/18/14 0647       Objective: BP 114/66 mmHg  Pulse 79  Temp(Src) 98.2 F (36.8 C) (Oral)  Resp 18  Ht 5\' 6"  (1.676 m)  Wt 94.5 kg (208 lb 5.4 oz)  BMI 33.64 kg/m2  SpO2 97%  Intake/Output Summary (Last 24 hours) at 09/23/14 1116 Last data filed at 09/23/14 0846  Gross per 24 hour  Intake    980 ml  Output   1075 ml  Net    -95 ml   Filed Weights   09/19/14 2133 09/20/14 2038 09/21/14 2128  Weight: 91.2 kg (201  lb 1 oz) 93.4 kg (205 lb 14.6 oz) 94.5 kg (208 lb 5.4 oz)    Exam:   General:  NAD  Cardiovascular: RRR  Respiratory: improved areation, prolonged expiratory phase, no wheezes. No rales. Intermittent mild rhonchi.  Abdomen: left sided tender, no guarding, Soft/ND, positive BS, old well healed mid line surgical scar.  Musculoskeletal: No Edema  Neuro: aaox3, no focal deficit  Data Reviewed: Basic Metabolic Panel:  Recent Labs Lab 09/19/14 0250 09/20/14 0331 09/21/14 0430 09/22/14 0700 09/23/14 0550  NA 139 142 141 136 137  K 3.8 3.8 3.7 3.6 4.1  CL 109 110 111 108 104  CO2 25 27  25 24 23   GLUCOSE 102* 102* 112* 180* 118*  BUN 9 9 9 9 12   CREATININE 0.97 1.08 1.03 0.90 1.02  CALCIUM 7.9* 8.0* 8.3* 7.8* 8.4*  MG  --  2.1 2.0 1.9 2.3   Liver Function Tests:  Recent Labs Lab 09/18/14 0147 09/19/14 0250 09/23/14 0550  AST 28 22 24   ALT 22 19 25   ALKPHOS 53 47 55  BILITOT 0.3 0.2* 0.8  PROT 5.8* 4.9* 5.8*  ALBUMIN 3.4* 2.8* 3.1*    Recent Labs Lab 09/18/14 0147 09/23/14 0550  LIPASE 29 21*   No results for input(s): AMMONIA in the last 168 hours. CBC:  Recent Labs Lab 09/18/14 0147  09/20/14 0331 09/20/14 1135 09/21/14 0430 09/22/14 0700 09/23/14 0745  WBC 9.3  < > 6.3 7.0 6.9 6.7 7.8  NEUTROABS 6.1  --   --   --   --   --   --   HGB 9.7*  < > 8.0* 8.4* 8.6* 8.3* 9.1*  HCT 31.9*  < > 26.4* 28.1* 29.4* 27.7* 29.5*  MCV 86.0  < > 85.2 85.4 86.2 85.5 82.9  PLT 292  < > 247 270 289 264 330  < > = values in this interval not displayed. Cardiac Enzymes:   No results for input(s): CKTOTAL, CKMB, CKMBINDEX, TROPONINI in the last 168 hours. BNP (last 3 results)  Recent Labs  03/21/14 0324 08/07/14 2327 09/14/14 1737  BNP 40.1 47.8 59.2    ProBNP (last 3 results)  Recent Labs  01/08/14 0930  PROBNP 105.2    CBG:  Recent Labs Lab 09/22/14 1455 09/22/14 1622 09/22/14 2156 09/23/14 0215 09/23/14 0741  GLUCAP 116* 97 175* 137* 165*    Recent Results (from the past 240 hour(s))  Culture, blood (routine x 2)     Status: None (Preliminary result)   Collection Time: 09/18/14  6:30 AM  Result Value Ref Range Status   Specimen Description BLOOD RIGHT ARM  Final   Special Requests BOTTLES DRAWN AEROBIC AND ANAEROBIC 5CC  Final   Culture NO GROWTH 4 DAYS  Final   Report Status PENDING  Incomplete  Culture, blood (routine x 2)     Status: None (Preliminary result)   Collection Time: 09/18/14  6:30 AM  Result Value Ref Range Status   Specimen Description BLOOD RIGHT ANTECUBITAL  Final   Special Requests BOTTLES DRAWN AEROBIC AND  ANAEROBIC 4CC  Final   Culture NO GROWTH 4 DAYS  Final   Report Status PENDING  Incomplete  MRSA PCR Screening     Status: None   Collection Time: 09/19/14  1:05 PM  Result Value Ref Range Status   MRSA by PCR NEGATIVE NEGATIVE Final    Comment:        The GeneXpert MRSA Assay (FDA approved for NASAL specimens only),  is one component of a comprehensive MRSA colonization surveillance program. It is not intended to diagnose MRSA infection nor to guide or monitor treatment for MRSA infections.      Studies: Ct Abdomen Pelvis W Contrast  09/23/2014   CLINICAL DATA:  Left-sided abdominal pain. History of diverticulosis.  EXAM: CT ABDOMEN AND PELVIS WITH CONTRAST  TECHNIQUE: Multidetector CT imaging of the abdomen and pelvis was performed using the standard protocol following bolus administration of intravenous contrast.  CONTRAST:  163mL OMNIPAQUE IOHEXOL 300 MG/ML  SOLN  COMPARISON:  09/18/2014  FINDINGS: There is marked gastric distention there is probably a prior antrectomy and gastrojejunostomy. There is extensive colonic diverticulosis. There is no evidence of diverticulitis or other acute inflammatory process. Small bowel is normal.  There are normal appearances of the liver. There is prior cholecystectomy. Biliary system is unremarkable.  The pancreas, spleen, and adrenals appear unremarkable.  There is a 7 x 10 mm lower pole right collecting system renal calculus. There is another calculus immediately anterior to this, measuring 3 x 4 mm. The kidneys and ureters are otherwise unremarkable in appearance. Urinary bladder appears unremarkable.  The abdominal aorta is normal in caliber. There is moderate atherosclerotic calcification. There is no adenopathy in the abdomen or pelvis.  There is a new trace pleural effusion on the left with slight adjacent atelectatic appearing posterior left lower lobe airspace opacity. There is a calcified granuloma in the right middle lobe base.  There is no  significant musculoskeletal lesion. There is moderately severe facet arthritis, greatest on the left at L4-5 and L5-S1.  IMPRESSION: 1. Diverticulosis without evidence of diverticulitis. 2. Marked gastric distention. Probable prior antrectomy and gastrojejunostomy. 3. 7 x 10 mm lower pole right renal calculus. Adjacent 3 x 4 mm calculus. 4. Trace left pleural effusion, new from 09/18/2014. Minimal adjacent atelectatic airspace opacities. 5. Lumbar facet arthritis, greatest on the left at L4-5 and L5-S1.   Electronically Signed   By: Andreas Newport M.D.   On: 09/23/2014 06:50    Scheduled Meds: . aspirin EC  81 mg Oral Daily  . azithromycin  500 mg Intravenous Q24H  . cefTRIAXone (ROCEPHIN)  IV  1 g Intravenous Q24H  . diltiazem  120 mg Oral Daily  . flecainide  225 mg Oral BID  . guaiFENesin  600 mg Oral BID  . insulin aspart  0-9 Units Subcutaneous TID WC  . insulin glargine  8 Units Subcutaneous BID  . ipratropium  0.5 mg Nebulization Q6H  . lactobacillus  1 g Oral TID WC  . levalbuterol  1.25 mg Nebulization 4 times per day  . loratadine  10 mg Oral BID  . metoprolol succinate  12.5 mg Oral Daily  . nicotine  14 mg Transdermal Daily  . pantoprazole  40 mg Oral Daily  . polyethylene glycol  17 g Oral Daily  . pravastatin  40 mg Oral q1800  . senna-docusate  2 tablet Oral BID  . [START ON 09/25/2014] warfarin  10 mg Oral Once per day on Fri  . warfarin  7.5 mg Oral Once per day on Sun Mon Tue Wed Thu Sat  . Warfarin - Pharmacist Dosing Inpatient   Does not apply q1800    Continuous Infusions: ivf stopped on 6/27     Time spent: 45mins  Thurnell Lose MD,   [facility] Pager [phone]. If 7PM-7AM, please contact night-coverage at [email or web address], password [facility] 09/23/2014, 11:16 AM  LOS: 5 days

## 2014-09-24 LAB — GLUCOSE, CAPILLARY
GLUCOSE-CAPILLARY: 205 mg/dL — AB (ref 65–99)
GLUCOSE-CAPILLARY: 108 mg/dL — AB (ref 65–99)
Glucose-Capillary: 132 mg/dL — ABNORMAL HIGH (ref 65–99)

## 2014-09-24 LAB — MAGNESIUM: Magnesium: 2.4 mg/dL (ref 1.7–2.4)

## 2014-09-24 LAB — PROTIME-INR
INR: 2.69 — ABNORMAL HIGH (ref 0.00–1.49)
Prothrombin Time: 28.2 seconds — ABNORMAL HIGH (ref 11.6–15.2)

## 2014-09-24 MED ORDER — SUCRALFATE 1 GM/10ML PO SUSP
1.0000 g | Freq: Three times a day (TID) | ORAL | Status: DC
  Administered 2014-09-24 (×2): 1 g via ORAL
  Filled 2014-09-24 (×2): qty 10

## 2014-09-24 MED ORDER — SUCRALFATE 1 GM/10ML PO SUSP: 1.0000 g | Freq: Three times a day (TID) | ORAL | Status: DC

## 2014-09-24 MED ORDER — PANTOPRAZOLE SODIUM 40 MG PO TBEC: 40.0000 mg | DELAYED_RELEASE_TABLET | Freq: Every day | ORAL | Status: DC

## 2014-09-24 MED ORDER — AZITHROMYCIN 500 MG PO TABS: 500.0000 mg | ORAL_TABLET | Freq: Every day | ORAL | Status: DC

## 2014-09-24 NOTE — Discharge Summary (Signed)
[name], is a 74 y.o. male  DOB July 21, 1940  MRN [id number].  Admission date:  09/18/2014  Admitting Physician  Ivor Costa, MD  Discharge Date:  09/24/2014   Primary MD  [name], MD  Recommendations for primary care physician for things to follow:   Review CT scan report, final EGD report carefully.  Needs outpatient GI follow-up with Dr. Cristina Gong  Check CBC, CMP, INR and a 2 view chest x-ray in 2-3 days , repeat TSH and iron panel in 4 weeks   Admission Diagnosis  LLQ abdominal pain [R10.32] LUQ abdominal pain [R10.12] CAP (community acquired pneumonia) [J18.9]   Discharge Diagnosis  LLQ abdominal pain [R10.32] LUQ abdominal pain [R10.12] CAP (community acquired pneumonia) [J18.9]     Active Problems:   Coronary artery disease, non-occlusive   DM (diabetes mellitus), type 2   Long term current use of anticoagulant therapy   Tobacco use disorder   CAP (community acquired pneumonia)   Pneumonia      Past Medical History  Diagnosis Date  . Atrial flutter     a. 07/2010 Status post caval tricuspid isthmus ablation by Dr. Midge Aver Metoprolol daily  . PAF (paroxysmal atrial fibrillation)     a. Recurrent after atrial flutter, currently controlled on flecainide plus diltiazem  . Hyperlipidemia     takes Pravastatin daily  . Diastolic CHF, chronic     a. 12/2012 EF 55-60%, diast dysfxn, triv MR, mildly dil LA/RA.  [name] Coronary artery disease, non-occlusive     a. 03/2010 Nonocclusive disease by cath, performed for ST elevations on ECG;  b. 06/2013 Lexi MV: EF 60%, no ischemia.  . Cervical radiculopathy due to degenerative joint disease of spine   . Claudication     a. lower ext dopplers 08/18/11-normal ABIs bilaterally with normal triphasic waveforms  . HTN (hypertension)     takes Diltiazem  daily  . Dysrhythmia     HX OF ATRIAL IFB /FLUTTER takes Flecanide and Coumadin daily  . History of bronchitis     1998  . Weakness     numbness and tingling both hands  . Joint pain   . History of gastric ulcer   . Anemia     takes Ferrous Sulfate daily  . History of blood transfusion 1982    "when I had stomach OR"  . Balance problem 01/2014  . Diabetes mellitus type II     takes Metformin and Lantus daily  . Pneumonia 1999  . CAP (community acquired pneumonia) 09/18/2014  . Arthritis     "all over"  . COPD (chronic obstructive pulmonary disease)     Past Surgical History  Procedure Laterality Date  . Atrial ablation surgery  08/05/10    CTI ablation for atrial flutter by [facility]  . Cardioversion  12/07/2010     Successful direct current cardioversion with atrial fibrillation to normal sinus rhythm  . Partial gastrectomy  1982    subtotal; "took out 30% for ulcers"  . Nm myoview ltd  07/22/2013  Normal EF ~60%, no ischemia or infarction.  . Colonoscopy [name] 12/02/2013    Procedure: COLONOSCOPY;  Surgeon: Irene Shipper, MD;  Location: [facility];  Service: Endoscopy;  Laterality: [name];  . Cardiac catheterization  2012/2013/2015    nl LV function, no occlusive CAD, PAF  . Knee bursectomy Left 1998  . Incision and drainage abscess / hematoma of bursa / knee / thigh Left 1998    knee  . Cataract extraction w/ intraocular lens  implant, bilateral Bilateral   . Yag laser application Bilateral   . Carpal tunnel release Bilateral 01/30/2014    Procedure: BILATERAL CARPAL TUNNEL RELEASE;  Surgeon: Marianna Payment, MD;  Location: [facility];  Service: Orthopedics;  Laterality: Bilateral;  . Transthoracic echocardiogram  02/16/2014    EF 60%, no RWMA. - otherwise normal  . Laparoscopic cholecystectomy  03/2010  . Esophagogastroduodenoscopy [name] 09/22/2014    Procedure: ESOPHAGOGASTRODUODENOSCOPY (EGD);  Surgeon: Ronald Lobo, MD;  Location: [facility] ENDOSCOPY;  Service: Endoscopy;  Laterality:  [name];       HPI  from the history and physical done on the day of admission:    74 year old male who  has a past medical history of Atrial flutter; PAF (paroxysmal atrial fibrillation); Hyperlipidemia; Diastolic CHF, chronic; Coronary artery disease, non-occlusive; COPD (chronic obstructive pulmonary disease); Cervical radiculopathy due to degenerative joint disease of spine; Claudication; Arthritis; HTN (hypertension); Diabetes mellitus type II; Dysrhythmia; Pneumonia; History of bronchitis; Weakness; Joint pain; History of gastric ulcer; Anemia; History of blood transfusion (1982); and Balance problem (01/2014).  Today came to the hospital with left-sided abdominal pain. Patient says that he has been hurting over the past few days the left lower quadrant, he denies nausea vomiting or diarrhea. Patient has a history of kidney stone in the past, in the ED CT of the abdomen pelvis showed small kidney stone in the right. Chest takes a showed multifocal pneumonia Patient also complains of coughing yellow-colored phlegm over the past few days, also admits to having chills. Denies any fever Patient is a smoker and smokes half-pack per day, also has a history of paroxysmal atrial fibrillation status post ablation currently on Coumadin for anticoagulation. Recently was seen by cardiology.     Hospital Course:     Iron deficiency anemia. GI following. The went EGD which showed some evidence of erythema and possible old ulceration around the gastrectomy site, also showed retained food, this was discussed by me with Dr. Cristina Gong personally final report is pending. He could have lost some blood from the gastric erosion, placed on PPI and Carafate, also per last colonoscopy he has internal hemorrhoids with minor rectal bleeding. Could be the source of anemia. Received IV iron, H&H stable. Follow with GI outpatient. [facility] PCP to monitor iron panel and CBC in the outpatient setting.   Left lower quadrant  abdominal pain, reported intermittent for the last several months. ? Cause. ? Referred pain from pneumonia, constipation? Muscle spasm? - Much improved after bowel regimen and a bowel movement, continue supportive care. Currently pain resolved. Follow with GI outpatient.   CAP with background copd - clinically much improved with empiric IV Rocephin and azithromycin, continue supportive care currently not on oxygen and no oxygen need, feels much better, urinary strep pneumo antigen negative, Legionella antigen negative. blood cultures and sputum culture negative thus far, chest pain resolved. Will give 3 more days of oral azithromycin upon discharge.   History of paroxysmal A. Fib/aflutter s/p ablation - Continue flecainide and metoprolol. ekg sinus  rhythm with first degree av block.Continue warfarin per pharmacy consultation. INR therapeutic.   Smoker: smoking cessation education provided, agreed with nicotine patch .   Elevated tsh, free t4 wnl - repeat TSH in 4-6 weeks likely sick euthyroid.   Lactic acidosis: resolved.   Constipation: Resolved after bowel regimen had a large bowel movement feels fine.   Insulin dependent Diabetes mellitus A1c was 5.8, resume home regimen unchanged.   Discharge Condition: Stable  Follow UP  Follow-up Information    Follow up with BOUSKA,DAVID E, MD. Schedule an appointment as soon as possible for a visit in 2 days.   Specialty:  Family Medicine   Why:  INR, CBC, CMP, 2 view chest x-ray checked post hospital discharge   Contact information:   [place] [address] [phone]       Follow up with Cleotis Nipper, MD. Schedule an appointment as soon as possible for a visit in 1 week.   Specialty:  Gastroenterology   Contact information:   [address] [place] [address] [phone]       Please follow up.   Why:  Abnormal EGD       Consults obtained -  GI  Diet and Activity recommendation: See Discharge Instructions below  Discharge Instructions       Discharge Instructions    Discharge instructions    Complete by:  As directed   Follow with Primary MD BOUSKA,DAVID E, MD in 2-3 days   Get CBC, CMP, INR, 2 view Chest X ray checked  by Primary MD next visit.    Activity: As tolerated with Full fall precautions use walker/cane & assistance as needed   Disposition Home     Diet: Heart Healthy  Low Carb  For Heart failure patients - Check your Weight same time everyday, if you gain over 2 pounds, or you develop in leg swelling, experience more shortness of breath or chest pain, call your Primary MD immediately. Follow Cardiac Low Salt Diet and 1.5 lit/day fluid restriction.   On your next visit with your primary care physician please Get Medicines reviewed and adjusted.   Please request your Prim.MD to go over all Hospital Tests and Procedure/Radiological results at the follow up, please get all Hospital records sent to your Prim MD by signing hospital release before you go home.   If you experience worsening of your admission symptoms, develop shortness of breath, life threatening emergency, suicidal or homicidal thoughts you must seek medical attention immediately by calling 911 or calling your MD immediately  if symptoms less severe.  You Must read complete instructions/literature along with all the possible adverse reactions/side effects for all the Medicines you take and that have been prescribed to you. Take any new Medicines after you have completely understood and accpet all the possible adverse reactions/side effects.   Do not drive, operating heavy machinery, perform activities at heights, swimming or participation in water activities or provide baby sitting services if your were admitted for syncope or siezures until you have seen by Primary MD or a Neurologist and advised to do so again.  Do not drive when taking Pain  medications.    Do not take more than prescribed Pain, Sleep and Anxiety Medications  Special Instructions: If you have smoked or chewed Tobacco  in the last 2 yrs please stop smoking, stop any regular Alcohol  and or any Recreational drug use.  Wear Seat belts  while driving.   Please note  You were cared for by a hospitalist during your hospital stay. If you have any questions about your discharge medications or the care you received while you were in the hospital after you are discharged, you can call the unit and asked to speak with the hospitalist on call if the hospitalist that took care of you is not available. Once you are discharged, your primary care physician will handle any further medical issues. Please note that NO REFILLS for any discharge medications will be authorized once you are discharged, as it is imperative that you return to your primary care physician (or establish a relationship with a primary care physician if you do not have one) for your aftercare needs so that they can reassess your need for medications and monitor your lab values.     Increase activity slowly    Complete by:  As directed              Discharge Medications       Medication List    STOP taking these medications        predniSONE 20 MG tablet  Commonly known as:  DELTASONE      TAKE these medications        albuterol 108 (90 BASE) MCG/ACT inhaler  Commonly known as:  PROVENTIL HFA;VENTOLIN HFA  Inhale 2 puffs into the lungs every 4 (four) hours as needed for wheezing or shortness of breath.     aspirin EC 81 MG tablet  Take 81 mg by mouth daily.     azithromycin 500 MG tablet  Commonly known as:  ZITHROMAX  Take 1 tablet (500 mg total) by mouth daily.     diltiazem 120 MG 24 hr capsule  Commonly known as:  CARDIZEM CD  Take 1 capsule (120 mg total) by mouth daily.     ferrous sulfate 325 (65 FE) MG tablet  Take 325 mg by mouth daily with breakfast.     flecainide 150 MG  tablet  Commonly known as:  TAMBOCOR  Take 1.5 tablets (225 mg total) by mouth 2 (two) times daily.     hydrocortisone cream 1 %  Apply 1 application topically 2 (two) times daily as needed for itching (rash).     LANTUS SOLOSTAR 100 UNIT/ML Solostar Pen  Generic drug:  Insulin Glargine  Inject 8 Units as directed 2 (two) times daily. Inject 8 units twice a day     loratadine 10 MG tablet  Commonly known as:  CLARITIN  Take 10 mg by mouth 2 (two) times daily.     metFORMIN 1000 MG tablet  Commonly known as:  GLUCOPHAGE  Take 500 mg by mouth 2 (two) times daily with a meal.     metoprolol succinate 25 MG 24 hr tablet  Commonly known as:  TOPROL XL  Take 0.5 tablets (12.5 mg total) by mouth daily.     multivitamin with minerals Tabs tablet  Take 1 tablet by mouth daily.     nitroGLYCERIN 0.4 MG SL tablet  Commonly known as:  NITROSTAT  Place 1 tablet (0.4 mg total) under the tongue every 5 (five) minutes x 3 doses as needed for chest pain.     pantoprazole 40 MG tablet  Commonly known as:  PROTONIX  Take 1 tablet (40 mg total) by mouth daily.     polyethylene glycol powder powder  Commonly known as:  MIRALAX  6 capfuls tomorrow morning. Then 3 capfuls three  times per day for 3 days. Then 1 capful three times per day for 3 days. Then 1 capful per day for 7 days.     pravastatin 40 MG tablet  Commonly known as:  PRAVACHOL  Take 40 mg by mouth daily at 6 PM.     sucralfate 1 GM/10ML suspension  Commonly known as:  CARAFATE  Take 10 mLs (1 g total) by mouth 4 (four) times daily -  with meals and at bedtime.     tamsulosin 0.4 MG Caps capsule  Commonly known as:  FLOMAX  Take 0.4 mg by mouth daily.     warfarin 5 MG tablet  Commonly known as:  COUMADIN  Take 7.5-10 mg by mouth daily at 6 PM. Take 2 tablet (10 mg) on Friday, take 1 1/2 tablets (7.5 mg) on Tuesday, Wednesday, Thursday, Saturday, Sunday, Monday        Major procedures and Radiology Reports - PLEASE  review detailed and final reports for all details, in brief -       Dg Chest 2 View  09/18/2014   CLINICAL DATA:  Acute onset of generalized chest pain and left upper quadrant abdominal pain. Initial encounter.  EXAM: CHEST  2 VIEW  COMPARISON:  Chest radiograph performed 09/14/2014  FINDINGS: Patchy bilateral airspace opacities, more prominent at the left lung base, raise concern for multifocal pneumonia. No pleural effusion or pneumothorax is seen. Underlying peribronchial thickening is noted.  The heart remains normal in size. No acute osseous abnormalities are identified. Postoperative change is noted about the gastroesophageal junction.  IMPRESSION: Patchy bilateral airspace opacities, more prominent at the left lung base, raise concern for multifocal pneumonia. Underlying peribronchial thickening noted.   Electronically Signed   By: Garald Balding M.D.   On: 09/18/2014 06:00   Dg Chest 2 View  09/14/2014   CLINICAL DATA:  Initial encounter for shortness of breath with productive cough.  EXAM: CHEST  2 VIEW  COMPARISON:  Chest x-ray from acute abdomen series of 08/27/2014.  FINDINGS: Lungs are mildly hyperexpanded. Interstitial markings are diffusely coarsened with chronic features. No edema or focal airspace consolidation. Cardiopericardial silhouette is at upper limits of normal for size. Opacity in the right cardiophrenic angle is secondary to a fat pad, as confirmed by CT scan of 08/21/2014. Imaged bony structures of the thorax are intact.  IMPRESSION: Stable.  No acute cardiopulmonary findings.   Electronically Signed   By: Misty Stanley M.D.   On: 09/14/2014 18:51   Ct Abdomen Pelvis W Contrast  09/23/2014   CLINICAL DATA:  Left-sided abdominal pain. History of diverticulosis.  EXAM: CT ABDOMEN AND PELVIS WITH CONTRAST  TECHNIQUE: Multidetector CT imaging of the abdomen and pelvis was performed using the standard protocol following bolus administration of intravenous contrast.  CONTRAST:   161mL OMNIPAQUE IOHEXOL 300 MG/ML  SOLN  COMPARISON:  09/18/2014  FINDINGS: There is marked gastric distention there is probably a prior antrectomy and gastrojejunostomy. There is extensive colonic diverticulosis. There is no evidence of diverticulitis or other acute inflammatory process. Small bowel is normal.  There are normal appearances of the liver. There is prior cholecystectomy. Biliary system is unremarkable.  The pancreas, spleen, and adrenals appear unremarkable.  There is a 7 x 10 mm lower pole right collecting system renal calculus. There is another calculus immediately anterior to this, measuring 3 x 4 mm. The kidneys and ureters are otherwise unremarkable in appearance. Urinary bladder appears unremarkable.  The abdominal aorta is normal in caliber.  There is moderate atherosclerotic calcification. There is no adenopathy in the abdomen or pelvis.  There is a new trace pleural effusion on the left with slight adjacent atelectatic appearing posterior left lower lobe airspace opacity. There is a calcified granuloma in the right middle lobe base.  There is no significant musculoskeletal lesion. There is moderately severe facet arthritis, greatest on the left at L4-5 and L5-S1.  IMPRESSION: 1. Diverticulosis without evidence of diverticulitis. 2. Marked gastric distention. Probable prior antrectomy and gastrojejunostomy. 3. 7 x 10 mm lower pole right renal calculus. Adjacent 3 x 4 mm calculus. 4. Trace left pleural effusion, new from 09/18/2014. Minimal adjacent atelectatic airspace opacities. 5. Lumbar facet arthritis, greatest on the left at L4-5 and L5-S1.   Electronically Signed   By: Andreas Newport M.D.   On: 09/23/2014 06:50   Ct Abdomen Pelvis W Contrast  09/18/2014   CLINICAL DATA:  Severe left lower quadrant abdominal pain. Initial encounter.  EXAM: CT ABDOMEN AND PELVIS WITH CONTRAST  TECHNIQUE: Multidetector CT imaging of the abdomen and pelvis was performed using the standard protocol  following bolus administration of intravenous contrast.  CONTRAST:  152mL OMNIPAQUE IOHEXOL 300 MG/ML  SOLN  COMPARISON:  CT of the abdomen and pelvis from 08/21/2014  FINDINGS: Minimal left basilar atelectasis is noted. Diffuse coronary artery calcifications are seen. Postoperative change is noted about the gastroesophageal junction.  The liver and spleen are unremarkable in appearance. The patient is status post cholecystectomy, with clips noted at the gallbladder fossa. The pancreas and adrenal glands are unremarkable.  Mild nonspecific perinephric stranding is noted bilaterally. A 7 mm stone is noted at the lower pole of the right kidney. The kidneys are otherwise unremarkable. There is no evidence of hydronephrosis. No adrenal residual stones are seen.  No free fluid is identified. The small bowel is unremarkable in appearance. The stomach is filled with solid material and within normal limits. No acute vascular abnormalities are seen. Scattered calcification is seen along the abdominal aorta and its branches, with minimal ectasia of the infrarenal abdominal aorta.  The appendix is normal in size and contains air, without evidence of appendicitis. Scattered diverticulosis is noted along the descending colon, without evidence of diverticulitis.  The bladder is mildly distended and is grossly unremarkable in appearance. The prostate is normal in size, with scattered calcification. No inguinal lymphadenopathy is seen.  No acute osseous abnormalities are identified. Facet disease is noted at the lower lumbar spine.  IMPRESSION: 1. No acute abnormality seen within the abdomen or pelvis. 2. Nonobstructing 7 mm stone at the lower pole of the right kidney. 3. Scattered calcification along the abdominal aorta and its branches. 4. Scattered diverticulosis along the descending colon, without evidence of diverticulitis. 5. Diffuse coronary artery calcifications seen.   Electronically Signed   By: Garald Balding M.D.   On:  09/18/2014 03:53   Dg Abd Acute W/chest  08/27/2014   CLINICAL DATA:  Constipation for 1 week. Chest pain. History of bowel resection.  EXAM: DG ABDOMEN ACUTE W/ 1V CHEST  COMPARISON:  Chest radiograph - 08/20/2014 ; 05/24/2014; CT abdomen pelvis- 08/21/2014  FINDINGS: Grossly unchanged cardiac silhouette and mediastinal contours given reduced lung volumes. Grossly unchanged bibasilar heterogeneous opacities, left greater than right, likely atelectasis. No new focal airspace opacities. No pleural effusion or pneumothorax. No definite evidence of edema.  Large colonic stool burden without evidence of enteric obstruction. No pneumoperitoneum, pneumatosis or portal venous gas.  Post cholecystectomy. Additional surgical clips overlie the  expected location of the gastroesophageal junction.  Punctate (approximately 0.9 cm) opacity overlying expected location of the inferior pole of the right kidney likely correlates with the nonobstructing stones seen on recently acquired abdominal CT. Additional punctate opacity overlying the left upper abdominal quadrant is favored to represent ingested radiopaque debris.  No acute osseus abnormalities.  IMPRESSION: 1. Bibasilar atelectasis without acute cardiopulmonary disease. 2. Large colonic stool burden without evidence of enteric obstruction. 3. Suspected right-sided nephrolithiasis as demonstrated on recently obtained abdominal CT.   Electronically Signed   By: Sandi Mariscal M.D.   On: 08/27/2014 17:31    Micro Results      Recent Results (from the past 240 hour(s))  Culture, blood (routine x 2)     Status: None   Collection Time: 09/18/14  6:30 AM  Result Value Ref Range Status   Specimen Description BLOOD RIGHT ARM  Final   Special Requests BOTTLES DRAWN AEROBIC AND ANAEROBIC 5CC  Final   Culture NO GROWTH 5 DAYS  Final   Report Status 09/23/2014 FINAL  Final  Culture, blood (routine x 2)     Status: None   Collection Time: 09/18/14  6:30 AM  Result Value Ref  Range Status   Specimen Description BLOOD RIGHT ANTECUBITAL  Final   Special Requests BOTTLES DRAWN AEROBIC AND ANAEROBIC 4CC  Final   Culture NO GROWTH 5 DAYS  Final   Report Status 09/23/2014 FINAL  Final  MRSA PCR Screening     Status: None   Collection Time: 09/19/14  1:05 PM  Result Value Ref Range Status   MRSA by PCR NEGATIVE NEGATIVE Final    Comment:        The GeneXpert MRSA Assay (FDA approved for NASAL specimens only), is one component of a comprehensive MRSA colonization surveillance program. It is not intended to diagnose MRSA infection nor to guide or monitor treatment for MRSA infections.        Today   Subjective    Hanan Leitz today has no headache,no chest abdominal pain,no new weakness tingling or numbness, feels much better wants to go home today.     Objective   Blood pressure 126/63, pulse 80, temperature 97.6 F (36.4 C), temperature source Oral, resp. rate 18, height 5\' 6"  (1.676 m), weight 94.5 kg (208 lb 5.4 oz), SpO2 97 %.   Intake/Output Summary (Last 24 hours) at 09/24/14 0852 Last data filed at 09/24/14 2800  Gross per 24 hour  Intake    820 ml  Output    100 ml  Net    720 ml    Exam Awake Alert, Oriented x 3, No new F.N deficits, Normal affect [place].AT,PERRAL Supple Neck,No JVD, No cervical lymphadenopathy appriciated.  Symmetrical Chest wall movement, Good air movement bilaterally, CTAB RRR,No Gallops,Rubs or new Murmurs, No Parasternal Heave +ve B.Sounds, Abd Soft, Non tender, No organomegaly appriciated, No rebound -guarding or rigidity. No Cyanosis, Clubbing or edema, No new Rash or bruise   Data Review   CBC w Diff: Lab Results  Component Value Date   WBC 7.8 09/23/2014   HGB 9.1* 09/23/2014   HCT 29.5* 09/23/2014   HCT 29.7* 09/19/2014   PLT 330 09/23/2014   LYMPHOPCT 22 09/18/2014   MONOPCT 8 09/18/2014   EOSPCT 4 09/18/2014   BASOPCT 1 09/18/2014    CMP: Lab Results  Component Value Date   NA 137  09/23/2014   K 4.1 09/23/2014   CL 104 09/23/2014   CO2 23 09/23/2014  BUN 12 09/23/2014   CREATININE 1.02 09/23/2014   PROT 5.8* 09/23/2014   ALBUMIN 3.1* 09/23/2014   BILITOT 0.8 09/23/2014   ALKPHOS 55 09/23/2014   AST 24 09/23/2014   ALT 25 09/23/2014  .  Lab Results  Component Value Date   INR 2.69* 09/24/2014   INR 2.12* 09/23/2014   INR 2.42* 09/22/2014    Total Time in preparing paper work, data evaluation and todays exam - 35 minutes  Thurnell Lose M.D on 09/24/2014 at 8:52 AM  [facility]   Office  [phone]

## 2014-09-24 NOTE — Progress Notes (Signed)
[name] to be D/C'd Home per MD order.  Discussed prescriptions and follow up appointments with the patient. Prescriptions given to patient, medication list explained in detail. Pt verbalized understanding.    Medication List    STOP taking these medications        predniSONE 20 MG tablet  Commonly known as:  DELTASONE      TAKE these medications        albuterol 108 (90 BASE) MCG/ACT inhaler  Commonly known as:  PROVENTIL HFA;VENTOLIN HFA  Inhale 2 puffs into the lungs every 4 (four) hours as needed for wheezing or shortness of breath.     aspirin EC 81 MG tablet  Take 81 mg by mouth daily.     azithromycin 500 MG tablet  Commonly known as:  ZITHROMAX  Take 1 tablet (500 mg total) by mouth daily.     diltiazem 120 MG 24 hr capsule  Commonly known as:  CARDIZEM CD  Take 1 capsule (120 mg total) by mouth daily.     ferrous sulfate 325 (65 FE) MG tablet  Take 325 mg by mouth daily with breakfast.     flecainide 150 MG tablet  Commonly known as:  TAMBOCOR  Take 1.5 tablets (225 mg total) by mouth 2 (two) times daily.     hydrocortisone cream 1 %  Apply 1 application topically 2 (two) times daily as needed for itching (rash).     LANTUS SOLOSTAR 100 UNIT/ML Solostar Pen  Generic drug:  Insulin Glargine  Inject 8 Units as directed 2 (two) times daily. Inject 8 units twice a day     loratadine 10 MG tablet  Commonly known as:  CLARITIN  Take 10 mg by mouth 2 (two) times daily.     metFORMIN 1000 MG tablet  Commonly known as:  GLUCOPHAGE  Take 500 mg by mouth 2 (two) times daily with a meal.     metoprolol succinate 25 MG 24 hr tablet  Commonly known as:  TOPROL XL  Take 0.5 tablets (12.5 mg total) by mouth daily.     multivitamin with minerals Tabs tablet  Take 1 tablet by mouth daily.     nitroGLYCERIN 0.4 MG SL tablet  Commonly known as:  NITROSTAT  Place 1 tablet (0.4 mg total) under the tongue every 5 (five) minutes x 3 doses as needed for chest pain.      pantoprazole 40 MG tablet  Commonly known as:  PROTONIX  Take 1 tablet (40 mg total) by mouth daily.     polyethylene glycol powder powder  Commonly known as:  MIRALAX  6 capfuls tomorrow morning. Then 3 capfuls three times per day for 3 days. Then 1 capful three times per day for 3 days. Then 1 capful per day for 7 days.     pravastatin 40 MG tablet  Commonly known as:  PRAVACHOL  Take 40 mg by mouth daily at 6 PM.     sucralfate 1 GM/10ML suspension  Commonly known as:  CARAFATE  Take 10 mLs (1 g total) by mouth 4 (four) times daily -  with meals and at bedtime.     tamsulosin 0.4 MG Caps capsule  Commonly known as:  FLOMAX  Take 0.4 mg by mouth daily.     warfarin 5 MG tablet  Commonly known as:  COUMADIN  Take 7.5-10 mg by mouth daily at 6 PM. Take 2 tablet (10 mg) on Friday, take 1 1/2 tablets (7.5 mg) on Tuesday, Wednesday,  Thursday, Saturday, Sunday, Monday        Filed Vitals:   09/24/14 0905  BP: 110/79  Pulse: 81  Temp: 97.8 F (36.6 C)  Resp: 18    Skin clean, dry and intact without evidence of skin break down, no evidence of skin tears noted. IV catheter discontinued intact. Site without signs and symptoms of complications. Dressing and pressure applied. Pt denies pain at this time. No complaints noted.  An After Visit Summary was printed and given to the patient. Patient escorted via [place], and D/C home via private auto.  Deantae Shackleton C 09/24/2014 1:25 PM

## 2014-09-24 NOTE — Evaluation (Addendum)
Physical Therapy Evaluation Patient Details Name: [name] MRN: [id number] DOB: Feb 13, 1941 Today's Date: 09/24/2014   History of Present Illness  74 y/o WM admitted with CAP after having progressive weakness.  Clinical Impression  Pt able to ambulate hallways with S/MOD I with occasional small stagger, but no LOB.  Pt attributes this to footwear as he normally wears sneakers and not slip on shoes.  Pt is pending d/c today and will not pick up on acute caseload.  Pt will not need any follow up PT at home. SOB with gait, but 02 99% on room air. Pt educated on standing HEP.    Follow Up Recommendations No PT follow up    Equipment Recommendations  None recommended by PT    Recommendations for Other Services       Precautions / Restrictions Precautions Precautions: None Restrictions Weight Bearing Restrictions: No      Mobility  Bed Mobility Overal bed mobility: Modified Independent                Transfers Overall transfer level: Modified independent                  Ambulation/Gait Ambulation/Gait assistance: Supervision;Modified independent (Device/Increase time) Ambulation Distance (Feet): 350 Feet Assistive device: None Gait Pattern/deviations: Staggering left;Step-through pattern Gait velocity: Quick   General Gait Details: Pt with occasional small stagger which pt attributes to wearing his slip on shoes (normally wears sneakers) and not having been up in a few days.  No LOB.  Cues to slow down gait speed for safety.  Stairs            Wheelchair Mobility    Modified Rankin (Stroke Patients Only)       Balance Overall balance assessment: Needs assistance   Sitting balance-Leahy Scale: Good       Standing balance-Leahy Scale: Fair                               Pertinent Vitals/Pain Pain Assessment: No/denies pain    Home Living Family/patient expects to be discharged to:: Private residence Living Arrangements:  Children Available Help at Discharge: Available 24 hours/day;Family Type of Home: Mobile home Home Access: Stairs to enter Entrance Stairs-Rails: Left;Right;Can reach both Technical brewer of Steps: 2 Home Layout: One level Home Equipment: None      Prior Function Level of Independence: Independent               Hand Dominance   Dominant Hand: Right    Extremity/Trunk Assessment   Upper Extremity Assessment: Overall WFL for tasks assessed           Lower Extremity Assessment: Overall WFL for tasks assessed      Cervical / Trunk Assessment: Normal  Communication   Communication: No difficulties  Cognition Arousal/Alertness: Awake/alert Behavior During Therapy: WFL for tasks assessed/performed Overall Cognitive Status: Within Functional Limits for tasks assessed                      General Comments      Exercises  Standing marching, mini-squats and heel raises at counter x 10 reps B'ly      Assessment/Plan    PT Assessment Patent does not need any further PT services  PT Diagnosis Difficulty walking   PT Problem List    PT Treatment Interventions     PT Goals (Current goals can be found in the Care Plan  section) Acute Rehab PT Goals PT Goal Formulation: All assessment and education complete, DC therapy    Frequency     Barriers to discharge        Co-evaluation               End of Session   Activity Tolerance: Patient tolerated treatment well Patient left: in chair;with call bell/phone within reach Nurse Communication: Mobility status         Time: 1740-8144 PT Time Calculation (min) (ACUTE ONLY): 17 min   Charges:   PT Evaluation $Initial PT Evaluation Tier I: 1 Procedure     PT G Codes:        Lesleyanne Politte LUBECK 09/24/2014, 9:34 AM

## 2014-09-24 NOTE — Op Note (Addendum)
[facility] [place], [id number]   ENDOSCOPY PROCEDURE REPORT  PATIENT: [name], [name]  MR#: [id number] BIRTHDATE: [birth year] , 22  yrs. old GENDER: male ENDOSCOPIST:Gabreil [place], MD REFERRED BY:  Dr. Bernerd Limbo PROCEDURE DATE:  10/04/14 PROCEDURE:   Upper endoscopy ASA CLASS:    III INDICATIONS: heme positive stool, iron deficiency anemia MEDICATION: fentanyl 50 g, Versed 4 mg IV TOPICAL ANESTHETIC:   Cetacaine spray  DESCRIPTION OF PROCEDURE:   After the risks and benefits of the procedure were explained, informed consent was obtained. the patient was brought from his hospital room to the [facility] cone endoscopy unit. Time out was performed. The Pentax adult video endoscope was introduced through the mouth  and advanced to the second portion of the duodenum .  The instrument was slowly withdrawn as the mucosa was fully examined. Estimated blood loss is zero unless otherwise noted in this procedure report.    The larynx was not well seen.  The esophagus was readily entered and was normal in its entirety. No reflux esophagitis, Barrett's esophagus, varices, infection, neoplasia, ring, stricture, or hiatal hernia were appreciated.  The gastric remnant was entered. It appeared quite large. It contained a large amount of bile and retained food debris. There was a double limb gastroenterostomy on the distal aspect of the gastric pouch. The margin of the gastroenterostomy was eroded and hemorrhagic. However, no deep ulcerations were seen, and there was no mass effect to suggest malignancy. Each limb was explored for a moderate distance, perhaps 5 or 10 cm, revealing normal appearing mucosa.  Retroflexion showed a normal cardia.  However, a large amount of the proximal stomach could not be seen because of residual food debris.  The scope was then withdrawn from the patient and the procedure completed. No biopsies were  obtained.  COMPLICATIONS: There were no immediate complications.  ENDOSCOPIC IMPRESSION: 1. Status post gastric surgery, with distal gastrectomy and double limb gastroenterostomy (Billroth II anatomy)  2. Marginal erosion and mucosal hemorrhage, presumably accounting for the patient's heme positive stool and iron deficiency anemia  3. Significant amount of retained food debris in the stomach, suggestive of gastric stasis given the absence of any anatomic gastric outlet obstruction.   RECOMMENDATIONS: 1. Initiate PPI therapy and maintain him on that indefinitely.  2. If the patient remains persistently heme positive and/or his hemoglobin does not correct, would consider addition of sucralfate to the above-mentioned PPI therapy.  3. This patient may be iron deficient simply on the basis of his Billroth II anatomy, although there is probably a contribution from the erosive marginal gastritis noted above. Since he had an essentially negative colonoscopy just a year ago, I do not feel that updated colonoscopy is needed, based on the current endoscopic findings which probably explain his heme positivity and possibly his anemia as well.  4. Follow-up endoscopy in a few months, to assess healing on PPI and/or sucralfate therapy, could be considered if he remains heme positive and/or his hemoglobin fails to correct on iron supplementation.   _______________________________ eSignedRonald Lobo, MD October 04, 2014 3:01 PM     cc:  CPT CODES: ICD CODES:  The ICD and CPT codes recommended by this software are interpretations from the data that the clinical staff has captured with the software.  The verification of the translation of this report to the ICD and CPT codes and modifiers is the sole responsibility of the health care institution and practicing physician where this report was  generated.  [employer]. will not be held responsible for the validity of  the ICD and CPT codes included on this report.  AMA assumes no liability for data contained or not contained herein. CPT is a Designer, television/film set of the [employer].  PATIENT NAME:  [name], [name] MR#: [id number]

## 2014-09-24 NOTE — Care Management Note (Signed)
Case Management Note  Patient Details  Name: [name] MRN: [id number] Date of Birth: 08-12-1940  Subjective/Objective:        CM following for progression and d/c planning.            Action/Plan: Not d/c needs identifed  Expected Discharge Date:     09/24/2014             Expected Discharge Plan:  Home/Self Care  In-House Referral:  NA  Discharge planning Services  NA  Post Acute Care Choice:  NA Choice offered to:  NA  DME Arranged:    DME Agency:     HH Arranged:    HH Agency:     Status of Service:  Completed, signed off  Medicare Important Message Given:  Yes-third notification given Date Medicare IM Given:    Medicare IM give by:    Date Additional Medicare IM Given:    Additional Medicare Important Message give by:     If discussed at [place] of Stay Meetings, dates discussed:    Additional Comments:  Adron Bene, RN 09/24/2014, 11:44 AM

## 2014-09-24 NOTE — Care Management (Signed)
Important Message  Patient Details  Name: [name] MRN: [id number] Date of Birth: [birth year]   Medicare Important Message Given:  Yes-third notification given    Delorse Lek 09/24/2014, 10:40 AM

## 2014-09-25 ENCOUNTER — Ambulatory Visit: Payer: Medicare Other | Admitting: Pharmacist Clinician (PhC)/ Clinical Pharmacy Specialist

## 2014-09-29 ENCOUNTER — Emergency Department (HOSPITAL_COMMUNITY)
Admission: EM | Admit: 2014-09-29 | Discharge: 2014-09-29 | Disposition: A | Discharge status: Home / Self Care | Payer: Medicare Other | Attending: Emergency Medicine | Admitting: Emergency Medicine

## 2014-09-29 ENCOUNTER — Encounter (HOSPITAL_COMMUNITY): Payer: Self-pay | Admitting: Emergency Medicine

## 2014-09-29 ENCOUNTER — Emergency Department (HOSPITAL_COMMUNITY): Payer: Medicare Other

## 2014-09-29 DIAGNOSIS — Z720 Tobacco use: Secondary | ICD-10-CM | POA: Insufficient documentation

## 2014-09-29 DIAGNOSIS — M1990 Unspecified osteoarthritis, unspecified site: Secondary | ICD-10-CM | POA: Insufficient documentation

## 2014-09-29 DIAGNOSIS — E119 Type 2 diabetes mellitus without complications: Secondary | ICD-10-CM | POA: Insufficient documentation

## 2014-09-29 DIAGNOSIS — R200 Anesthesia of skin: Secondary | ICD-10-CM | POA: Diagnosis not present

## 2014-09-29 DIAGNOSIS — D649 Anemia, unspecified: Secondary | ICD-10-CM | POA: Diagnosis not present

## 2014-09-29 DIAGNOSIS — I10 Essential (primary) hypertension: Secondary | ICD-10-CM | POA: Diagnosis not present

## 2014-09-29 DIAGNOSIS — Z7982 Long term (current) use of aspirin: Secondary | ICD-10-CM | POA: Insufficient documentation

## 2014-09-29 DIAGNOSIS — J449 Chronic obstructive pulmonary disease, unspecified: Secondary | ICD-10-CM | POA: Diagnosis not present

## 2014-09-29 DIAGNOSIS — R0602 Shortness of breath: Secondary | ICD-10-CM | POA: Diagnosis present

## 2014-09-29 DIAGNOSIS — I2510 Atherosclerotic heart disease of native coronary artery without angina pectoris: Secondary | ICD-10-CM | POA: Insufficient documentation

## 2014-09-29 DIAGNOSIS — M5412 Radiculopathy, cervical region: Secondary | ICD-10-CM

## 2014-09-29 DIAGNOSIS — I5032 Chronic diastolic (congestive) heart failure: Secondary | ICD-10-CM | POA: Diagnosis not present

## 2014-09-29 DIAGNOSIS — Z79899 Other long term (current) drug therapy: Secondary | ICD-10-CM | POA: Diagnosis not present

## 2014-09-29 DIAGNOSIS — Z792 Long term (current) use of antibiotics: Secondary | ICD-10-CM | POA: Diagnosis not present

## 2014-09-29 DIAGNOSIS — Z8701 Personal history of pneumonia (recurrent): Secondary | ICD-10-CM | POA: Diagnosis not present

## 2014-09-29 DIAGNOSIS — I480 Paroxysmal atrial fibrillation: Secondary | ICD-10-CM | POA: Insufficient documentation

## 2014-09-29 DIAGNOSIS — E785 Hyperlipidemia, unspecified: Secondary | ICD-10-CM | POA: Insufficient documentation

## 2014-09-29 LAB — BASIC METABOLIC PANEL
ANION GAP: 7 (ref 5–15)
BUN: 10 mg/dL (ref 6–20)
CALCIUM: 8.3 mg/dL — AB (ref 8.9–10.3)
CO2: 23 mmol/L (ref 22–32)
CREATININE: 0.93 mg/dL (ref 0.61–1.24)
Chloride: 107 mmol/L (ref 101–111)
GFR calc Af Amer: >60 mL/min (ref >60)
GLUCOSE: 129 mg/dL — AB (ref 65–99)
Potassium: 4.1 mmol/L (ref 3.5–5.1)
Sodium: 137 mmol/L (ref 135–145)

## 2014-09-29 LAB — CBC
HEMATOCRIT: 33 % — AB (ref 39.0–52.0)
Hemoglobin: 9.9 g/dL — ABNORMAL LOW (ref 13.0–17.0)
MCH: 26.5 pg (ref 26.0–34.0)
MCHC: 30 g/dL (ref 30.0–36.0)
MCV: 88.5 fL (ref 78.0–100.0)
Platelets: 354 10*3/uL (ref 150–400)
RBC: 3.73 MIL/uL — ABNORMAL LOW (ref 4.22–5.81)
RDW: 18.7 % — ABNORMAL HIGH (ref 11.5–15.5)
WBC: 7.3 10*3/uL (ref 4.0–10.5)

## 2014-09-29 LAB — I-STAT TROPONIN, ED: TROPONIN I, POC: 0.01 ng/mL (ref 0.00–0.08)

## 2014-09-29 LAB — BRAIN NATRIURETIC PEPTIDE: B NATRIURETIC PEPTIDE 5: 49.5 pg/mL (ref 0.0–100.0)

## 2014-09-29 MED ORDER — DIAZEPAM 5 MG PO TABS: 5.0000 mg | ORAL_TABLET | Freq: Four times a day (QID) | ORAL | Status: DC | PRN

## 2014-09-29 NOTE — ED Notes (Signed)
Pt from home for eval of sob for the past few months that has not gotten better with prescriptions from prednisone. Pt also reports left sided numbness that comes and goes x1 month with radiation to left side of neck. Pt able to speak in complete sentences, no neuro deficits noted.

## 2014-09-29 NOTE — ED Provider Notes (Signed)
CSN: [id number]     Arrival date & time 09/29/14  1712 History   First MD Initiated Contact with Patient 09/29/14 1851     Chief Complaint  Patient presents with  . Shortness of Breath  . Numbness     (Consider location/radiation/quality/duration/timing/severity/associated sxs/prior Treatment) HPI   [name] is a 74 y.o. male who presents for evaluation of numbness and pain, left arm and left neck. Symptoms ongoing for several months, and worsened the last 24 hours. No recent trauma. He was recently hospitalized for a multifocal pneumonia and states that his shortness of breath and cough have improved. No recent trauma. No weakness. No headache, nausea, vomiting. There are no other known modifying factors.   Past Medical History  Diagnosis Date  . Atrial flutter     a. 07/2010 Status post caval tricuspid isthmus ablation by Dr. Midge Aver Metoprolol daily  . PAF (paroxysmal atrial fibrillation)     a. Recurrent after atrial flutter, currently controlled on flecainide plus diltiazem  . Hyperlipidemia     takes Pravastatin daily  . Diastolic CHF, chronic     a. 12/2012 EF 55-60%, diast dysfxn, triv MR, mildly dil LA/RA.  [name] Coronary artery disease, non-occlusive     a. 03/2010 Nonocclusive disease by cath, performed for ST elevations on ECG;  b. 06/2013 Lexi MV: EF 60%, no ischemia.  . Cervical radiculopathy due to degenerative joint disease of spine   . Claudication     a. lower ext dopplers 08/18/11-normal ABIs bilaterally with normal triphasic waveforms  . HTN (hypertension)     takes Diltiazem daily  . Dysrhythmia     HX OF ATRIAL IFB /FLUTTER takes Flecanide and Coumadin daily  . History of bronchitis     1998  . Weakness     numbness and tingling both hands  . Joint pain   . History of gastric ulcer   . Anemia     takes Ferrous Sulfate daily  . History of blood transfusion 1982    "when I had stomach OR"  . Balance problem 01/2014  . Diabetes mellitus type II      takes Metformin and Lantus daily  . Pneumonia 1999  . CAP (community acquired pneumonia) 09/18/2014  . Arthritis     "all over"  . COPD (chronic obstructive pulmonary disease)    Past Surgical History  Procedure Laterality Date  . Atrial ablation surgery  08/05/10    CTI ablation for atrial flutter by JA  . Cardioversion  12/07/2010     Successful direct current cardioversion with atrial fibrillation to normal sinus rhythm  . Partial gastrectomy  1982    subtotal; "took out 30% for ulcers"  . Nm myoview ltd  07/22/2013    Normal EF ~60%, no ischemia or infarction.  . Colonoscopy [name] 12/02/2013    Procedure: COLONOSCOPY;  Surgeon: Irene Shipper, MD;  Location: [place];  Service: Endoscopy;  Laterality: [name];  . Cardiac catheterization  2012/2013/2015    nl LV function, no occlusive CAD, PAF  . Knee bursectomy Left 1998  . Incision and drainage abscess / hematoma of bursa / knee / thigh Left 1998    knee  . Cataract extraction w/ intraocular lens  implant, bilateral Bilateral   . Yag laser application Bilateral   . Carpal tunnel release Bilateral 01/30/2014    Procedure: BILATERAL CARPAL TUNNEL RELEASE;  Surgeon: Marianna Payment, MD;  Location: [place];  Service: Orthopedics;  Laterality: Bilateral;  . Transthoracic echocardiogram  02/16/2014    EF 60%, no RWMA. - otherwise normal  . Laparoscopic cholecystectomy  03/2010  . Esophagogastroduodenoscopy [name] 09/22/2014    Procedure: ESOPHAGOGASTRODUODENOSCOPY (EGD);  Surgeon: Ronald Lobo, MD;  [facility] ENDOSCOPY;  Service: Endoscopy;  Laterality: [name];   Family History  Problem Relation Age of Onset  . Cancer Mother   . Heart attack Father   . Heart attack Brother    History  Substance Use Topics  . Smoking status: Current Every Day Smoker -- 0.50 packs/day for 55 years    Types: Cigarettes  . Smokeless tobacco: Never Used     Comment: He's been smoking between 0.5 and 2 ppd since age 40.  [name] Alcohol Use: 0.0 oz/week    0  Standard drinks or equivalent per week     Comment: "quit drinking in 1986"    Review of Systems  All other systems reviewed and are negative.     Allergies  Review of patient's allergies indicates no known allergies.  Home Medications   Prior to Admission medications   Medication Sig Start Date End Date Taking? Authorizing Provider  albuterol (PROVENTIL HFA;VENTOLIN HFA) 108 (90 BASE) MCG/ACT inhaler Inhale 2 puffs into the lungs every 4 (four) hours as needed for wheezing or shortness of breath. 09/14/14  Yes Sherwood Gambler, MD  aspirin EC 81 MG tablet Take 81 mg by mouth daily.   Yes Historical Provider, MD  azithromycin (ZITHROMAX) 500 MG tablet Take 1 tablet (500 mg total) by mouth daily. 09/24/14  Yes Thurnell Lose, MD  diltiazem (CARDIZEM CD) 120 MG 24 hr capsule Take 1 capsule (120 mg total) by mouth daily. 04/03/14  Yes Erlene Quan, PA-C  ferrous sulfate 325 (65 FE) MG tablet Take 325 mg by mouth daily with breakfast.   Yes Historical Provider, MD  flecainide (TAMBOCOR) 150 MG tablet Take 1.5 tablets (225 mg total) by mouth 2 (two) times daily. 06/23/14  Yes Leonie Man, MD  hydrocortisone cream 1 % Apply 1 application topically 2 (two) times daily as needed for itching (rash).    Yes Historical Provider, MD  LANTUS SOLOSTAR 100 UNIT/ML Solostar Pen Inject 8 Units as directed 2 (two) times daily. Inject 8 units twice a day 08/31/14  Yes Historical Provider, MD  loratadine (CLARITIN) 10 MG tablet Take 10 mg by mouth 2 (two) times daily.    Yes Historical Provider, MD  metFORMIN (GLUCOPHAGE) 1000 MG tablet Take 500 mg by mouth 2 (two) times daily with a meal.    Yes Historical Provider, MD  metoprolol succinate (TOPROL XL) 25 MG 24 hr tablet Take 0.5 tablets (12.5 mg total) by mouth daily. Patient taking differently: Take 25 mg by mouth daily.  05/22/14  Yes Leonie Man, MD  Multiple Vitamin (MULTIVITAMIN WITH MINERALS) TABS tablet Take 1 tablet by mouth daily.   Yes  Historical Provider, MD  nitroGLYCERIN (NITROSTAT) 0.4 MG SL tablet Place 1 tablet (0.4 mg total) under the tongue every 5 (five) minutes x 3 doses as needed for chest pain. 05/22/14  Yes Leonie Man, MD  pantoprazole (PROTONIX) 40 MG tablet Take 1 tablet (40 mg total) by mouth daily. 09/24/14  Yes Thurnell Lose, MD  polyethylene glycol powder (MIRALAX) powder 6 capfuls tomorrow morning. Then 3 capfuls three times per day for 3 days. Then 1 capful three times per day for 3 days. Then 1 capful per day for 7 days. 08/27/14  Yes Addison Lank, MD  pravastatin (PRAVACHOL) 40  MG tablet Take 40 mg by mouth daily at 6 PM.    Yes Historical Provider, MD  sucralfate (CARAFATE) 1 GM/10ML suspension Take 10 mLs (1 g total) by mouth 4 (four) times daily -  with meals and at bedtime. 09/24/14  Yes Thurnell Lose, MD  tamsulosin (FLOMAX) 0.4 MG CAPS capsule Take 0.4 mg by mouth daily.  05/22/14 05/22/15 Yes Historical Provider, MD  warfarin (COUMADIN) 5 MG tablet Take 7.5-10 mg by mouth daily at 6 PM. Take 2 tablet (10 mg) on Friday, take 1 1/2 tablets (7.5 mg) on Tuesday, Wednesday, Thursday, Saturday, Sunday, Monday   Yes Historical Provider, MD   BP 125/57 mmHg  Pulse 86  Temp(Src) 98.1 F (36.7 C) (Oral)  Resp 16  Ht 5\' 6"  (1.676 m)  Wt 186 lb 4.8 oz (84.505 kg)  BMI 30.08 kg/m2  SpO2 97% Physical Exam  Constitutional: He is oriented to person, place, and time. He appears well-developed and well-nourished.  HENT:  Head: Normocephalic and atraumatic.  Right Ear: External ear normal.  Left Ear: External ear normal.  Eyes: Conjunctivae and EOM are normal. Pupils are equal, round, and reactive to light.  Neck: Normal range of motion and phonation normal. Neck supple.  Cardiovascular: Normal rate, regular rhythm and normal heart sounds.   Pulmonary/Chest: Effort normal and breath sounds normal. He exhibits no bony tenderness.  Abdominal: Soft. There is no tenderness.  Musculoskeletal: Normal range of  motion.  Normal range of motion, neck, both arms and shoulders, bilaterally. Normal strength in arms and legs bilaterally.  Neurological: He is alert and oriented to person, place, and time. No cranial nerve deficit or sensory deficit. He exhibits normal muscle tone. Coordination normal.  No altered light touch sensation of either arm.  Skin: Skin is warm, dry and intact.  Psychiatric: He has a normal mood and affect. His behavior is normal. Judgment and thought content normal.  Nursing note and vitals reviewed.   ED Course  Procedures (including critical care time)  Findings discussed with patient, and daughter, all questions answered. He has a follow-up with his PCP in 11 days.  Labs Review Labs Reviewed  CBC - Abnormal; Notable for the following:    RBC 3.73 (*)    Hemoglobin 9.9 (*)    HCT 33.0 (*)    RDW 18.7 (*)    All other components within normal limits  BASIC METABOLIC PANEL - Abnormal; Notable for the following:    Glucose, Bld 129 (*)    Calcium 8.3 (*)    All other components within normal limits  BRAIN NATRIURETIC PEPTIDE  I-STAT TROPOININ, ED    Imaging Review Dg Chest 2 View  09/29/2014   CLINICAL DATA:  Patient with shortness of breath and left arm numbness extending into the left shoulder. This has been present for 2 months.  EXAM: CHEST  2 VIEW  COMPARISON:  Chest radiograph 09/18/2014 semi: CT abdomen pelvis 09/22/2014 and 08/01/2013 chest radiograph 08/01/2013.  FINDINGS: Stable cardiac and mediastinal contours. Interval improvement but not complete resolution of previously described patchy opacities within the left lower hemi thorax. There is a 9 mm nodule within the left lower hemi thorax. No definite pleural effusion or pneumothorax. Mid thoracic spine degenerative changes. Upper abdominal surgical clips.  IMPRESSION: Suggestion of a 9 mm nodular density projecting over the lateral left lower hemi thorax which may represent nipple shadow. Prior to CT evaluation,  recommend further evaluation with repeat radiograph and nipple markers. If this does  not correspond with nipple shadow, CT would be warranted in the non acute setting.  Interval improvement in previously described heterogeneous opacities left lower lobe, potentially resolving infection.   Electronically Signed   By: Lovey Newcomer M.D.   On: 09/29/2014 18:27     EKG Interpretation None      MDM   Final diagnoses:  Cervical radiculopathy    Suspect cervical neuropathy related to suspected cervical joint disease cervical spine. No evidence for worsening pneumonia, serious bacterial infection or metabolic instability.   Nursing Notes Reviewed/ Care Coordinated Applicable Imaging Reviewed Interpretation of Laboratory Data incorporated into ED treatment  The patient appears reasonably screened and/or stabilized for discharge and I doubt any other medical condition or other [facility] requiring further screening, evaluation, or treatment in the ED at this time prior to discharge.  Plan: Home Medications- Tylenol, Valium; Home Treatments- rest, heat; return here if the recommended treatment, does not improve the symptoms; Recommended follow up- PCP as scheduled   Daleen Bo, MD 09/29/14 9532

## 2014-09-29 NOTE — ED Notes (Signed)
Pt A&Ox4, ambulatory at d/c with steady gait, NAD 

## 2014-09-29 NOTE — Discharge Instructions (Signed)
Use heat on the sore area every 2 or 3 hours. Take Tylenol 650 mg every 4 hours as needed for pain. Follow-up with your doctor as planned.   Cervical Radiculopathy Cervical radiculopathy happens when a nerve in the neck is pinched or bruised by a slipped (herniated) disk or by arthritic changes in the bones of the cervical spine. This can occur due to an injury or as part of the normal aging process. Pressure on the cervical nerves can cause pain or numbness that runs from your neck all the way down into your arm and fingers. CAUSES  There are many possible causes, including:  Injury.  Muscle tightness in the neck from overuse.  Swollen, painful joints (arthritis).  Breakdown or degeneration in the bones and joints of the spine (spondylosis) due to aging.  Bone spurs that may develop near the cervical nerves. SYMPTOMS  Symptoms include pain, weakness, or numbness in the affected arm and hand. Pain can be severe or irritating. Symptoms may be worse when extending or turning the neck. DIAGNOSIS  Your caregiver will ask about your symptoms and do a physical exam. He or she may test your strength and reflexes. X-rays, CT scans, and MRI scans may be needed in cases of injury or if the symptoms do not go away after a period of time. Electromyography (EMG) or nerve conduction testing may be done to study how your nerves and muscles are working. TREATMENT  Your caregiver may recommend certain exercises to help relieve your symptoms. Cervical radiculopathy can, and often does, get better with time and treatment. If your problems continue, treatment options may include:  Wearing a soft collar for short periods of time.  Physical therapy to strengthen the neck muscles.  Medicines, such as nonsteroidal anti-inflammatory drugs (NSAIDs), oral corticosteroids, or spinal injections.  Surgery. Different types of surgery may be done depending on the cause of your problems. HOME CARE INSTRUCTIONS    Put ice on the affected area.  Put ice in a plastic bag.  Place a towel between your skin and the bag.  Leave the ice on for 15-20 minutes, 03-04 times a day or as directed by your caregiver.  If ice does not help, you can try using heat. Take a warm shower or bath, or use a hot water bottle as directed by your caregiver.  You may try a gentle neck and shoulder massage.  Use a flat pillow when you sleep.  Only take over-the-counter or prescription medicines for pain, discomfort, or fever as directed by your caregiver.  If physical therapy was prescribed, follow your caregiver's directions.  If a soft collar was prescribed, use it as directed. SEEK IMMEDIATE MEDICAL CARE IF:   Your pain gets much worse and cannot be controlled with medicines.  You have weakness or numbness in your hand, arm, face, or leg.  You have a high fever or a stiff, rigid neck.  You lose bowel or bladder control (incontinence).  You have trouble with walking, balance, or speaking. MAKE SURE YOU:   Understand these instructions.  Will watch your condition.  Will get help right away if you are not doing well or get worse. Document Released: 12/06/2000 Document Revised: 06/05/2011 Document Reviewed: 10/25/2010 [facility] Patient Information 2015 [place], [place]. This information is not intended to replace advice given to you by your health care provider. Make sure you discuss any questions you have with your health care provider.

## 2014-10-09 ENCOUNTER — Encounter (HOSPITAL_COMMUNITY): Payer: Self-pay | Admitting: Emergency Medicine

## 2014-10-09 ENCOUNTER — Emergency Department (HOSPITAL_COMMUNITY): Payer: Medicare Other

## 2014-10-09 ENCOUNTER — Emergency Department (HOSPITAL_COMMUNITY)
Admission: EM | Admit: 2014-10-09 | Discharge: 2014-10-10 | Disposition: A | Discharge status: Home / Self Care | Payer: Medicare Other | Attending: Emergency Medicine | Admitting: Emergency Medicine

## 2014-10-09 ENCOUNTER — Ambulatory Visit (INDEPENDENT_AMBULATORY_CARE_PROVIDER_SITE_OTHER): Payer: Medicare Other | Admitting: Pharmacist Clinician (PhC)/ Clinical Pharmacy Specialist

## 2014-10-09 DIAGNOSIS — J449 Chronic obstructive pulmonary disease, unspecified: Secondary | ICD-10-CM | POA: Diagnosis not present

## 2014-10-09 DIAGNOSIS — E119 Type 2 diabetes mellitus without complications: Secondary | ICD-10-CM | POA: Insufficient documentation

## 2014-10-09 DIAGNOSIS — M1990 Unspecified osteoarthritis, unspecified site: Secondary | ICD-10-CM | POA: Insufficient documentation

## 2014-10-09 DIAGNOSIS — M5412 Radiculopathy, cervical region: Secondary | ICD-10-CM | POA: Insufficient documentation

## 2014-10-09 DIAGNOSIS — I480 Paroxysmal atrial fibrillation: Secondary | ICD-10-CM | POA: Diagnosis not present

## 2014-10-09 DIAGNOSIS — Z7982 Long term (current) use of aspirin: Secondary | ICD-10-CM | POA: Insufficient documentation

## 2014-10-09 DIAGNOSIS — Z7901 Long term (current) use of anticoagulants: Secondary | ICD-10-CM | POA: Insufficient documentation

## 2014-10-09 DIAGNOSIS — M5030 Other cervical disc degeneration, unspecified cervical region: Secondary | ICD-10-CM | POA: Diagnosis not present

## 2014-10-09 DIAGNOSIS — Z720 Tobacco use: Secondary | ICD-10-CM | POA: Diagnosis not present

## 2014-10-09 DIAGNOSIS — M79622 Pain in left upper arm: Secondary | ICD-10-CM | POA: Insufficient documentation

## 2014-10-09 DIAGNOSIS — Z9889 Other specified postprocedural states: Secondary | ICD-10-CM | POA: Diagnosis not present

## 2014-10-09 DIAGNOSIS — Z8701 Personal history of pneumonia (recurrent): Secondary | ICD-10-CM | POA: Insufficient documentation

## 2014-10-09 DIAGNOSIS — Z792 Long term (current) use of antibiotics: Secondary | ICD-10-CM | POA: Insufficient documentation

## 2014-10-09 DIAGNOSIS — Z043 Encounter for examination and observation following other accident: Secondary | ICD-10-CM | POA: Insufficient documentation

## 2014-10-09 DIAGNOSIS — Z794 Long term (current) use of insulin: Secondary | ICD-10-CM | POA: Insufficient documentation

## 2014-10-09 DIAGNOSIS — Z79899 Other long term (current) drug therapy: Secondary | ICD-10-CM | POA: Insufficient documentation

## 2014-10-09 DIAGNOSIS — I2510 Atherosclerotic heart disease of native coronary artery without angina pectoris: Secondary | ICD-10-CM | POA: Insufficient documentation

## 2014-10-09 DIAGNOSIS — D649 Anemia, unspecified: Secondary | ICD-10-CM | POA: Insufficient documentation

## 2014-10-09 DIAGNOSIS — I5032 Chronic diastolic (congestive) heart failure: Secondary | ICD-10-CM | POA: Insufficient documentation

## 2014-10-09 DIAGNOSIS — E785 Hyperlipidemia, unspecified: Secondary | ICD-10-CM | POA: Diagnosis not present

## 2014-10-09 DIAGNOSIS — I10 Essential (primary) hypertension: Secondary | ICD-10-CM | POA: Insufficient documentation

## 2014-10-09 DIAGNOSIS — I4892 Unspecified atrial flutter: Secondary | ICD-10-CM | POA: Insufficient documentation

## 2014-10-09 DIAGNOSIS — Y9289 Other specified places as the place of occurrence of the external cause: Secondary | ICD-10-CM | POA: Insufficient documentation

## 2014-10-09 DIAGNOSIS — M47812 Spondylosis without myelopathy or radiculopathy, cervical region: Secondary | ICD-10-CM

## 2014-10-09 DIAGNOSIS — Y9389 Activity, other specified: Secondary | ICD-10-CM | POA: Diagnosis not present

## 2014-10-09 DIAGNOSIS — W1839XA Other fall on same level, initial encounter: Secondary | ICD-10-CM | POA: Insufficient documentation

## 2014-10-09 DIAGNOSIS — Y998 Other external cause status: Secondary | ICD-10-CM | POA: Diagnosis not present

## 2014-10-09 DIAGNOSIS — R5383 Other fatigue: Secondary | ICD-10-CM | POA: Diagnosis present

## 2014-10-09 LAB — BASIC METABOLIC PANEL
ANION GAP: 7 (ref 5–15)
BUN: 15 mg/dL (ref 6–20)
CHLORIDE: 105 mmol/L (ref 101–111)
CO2: 28 mmol/L (ref 22–32)
Calcium: 9 mg/dL (ref 8.9–10.3)
Creatinine, Ser: 1.08 mg/dL (ref 0.61–1.24)
GFR calc Af Amer: >60 mL/min (ref >60)
Glucose, Bld: 127 mg/dL — ABNORMAL HIGH (ref 65–99)
Potassium: 3.9 mmol/L (ref 3.5–5.1)
Sodium: 140 mmol/L (ref 135–145)

## 2014-10-09 LAB — CBC
HEMATOCRIT: 34.1 % — AB (ref 39.0–52.0)
HEMOGLOBIN: 10.2 g/dL — AB (ref 13.0–17.0)
MCH: 27.2 pg (ref 26.0–34.0)
MCHC: 29.9 g/dL — ABNORMAL LOW (ref 30.0–36.0)
MCV: 90.9 fL (ref 78.0–100.0)
PLATELETS: 269 10*3/uL (ref 150–400)
RBC: 3.75 MIL/uL — AB (ref 4.22–5.81)
RDW: 20 % — ABNORMAL HIGH (ref 11.5–15.5)
WBC: 6.8 10*3/uL (ref 4.0–10.5)

## 2014-10-09 LAB — URINALYSIS, ROUTINE W REFLEX MICROSCOPIC
Bilirubin Urine: NEGATIVE
Glucose, UA: NEGATIVE mg/dL
HGB URINE DIPSTICK: NEGATIVE
Ketones, ur: NEGATIVE mg/dL
Leukocytes, UA: NEGATIVE
NITRITE: NEGATIVE
PROTEIN: NEGATIVE mg/dL
Specific Gravity, Urine: 1.013 (ref 1.005–1.030)
UROBILINOGEN UA: 0.2 mg/dL (ref 0.0–1.0)
pH: 6.5 (ref 5.0–8.0)

## 2014-10-09 LAB — DIFFERENTIAL
Basophils Absolute: 0.1 10*3/uL (ref 0.0–0.1)
Basophils Relative: 2 % — ABNORMAL HIGH (ref 0–1)
Eosinophils Absolute: 0.6 10*3/uL (ref 0.0–0.7)
Eosinophils Relative: 9 % — ABNORMAL HIGH (ref 0–5)
LYMPHS ABS: 2.1 10*3/uL (ref 0.7–4.0)
Lymphocytes Relative: 31 % (ref 12–46)
MONO ABS: 0.7 10*3/uL (ref 0.1–1.0)
Monocytes Relative: 10 % (ref 3–12)
NEUTROS ABS: 3.3 10*3/uL (ref 1.7–7.7)
Neutrophils Relative %: 48 % (ref 43–77)

## 2014-10-09 LAB — POCT INR: INR: 2.7

## 2014-10-09 LAB — ETHANOL

## 2014-10-09 LAB — I-STAT TROPONIN, ED: Troponin i, poc: 0 ng/mL (ref 0.00–0.08)

## 2014-10-09 LAB — PROTIME-INR
INR: 2.56 — AB (ref 0.00–1.49)
Prothrombin Time: 27.2 seconds — ABNORMAL HIGH (ref 11.6–15.2)

## 2014-10-09 LAB — APTT: APTT: 43 s — AB (ref 24–37)

## 2014-10-09 MED ORDER — HYDROCODONE-ACETAMINOPHEN 5-325 MG PO TABS: 1.0000 | ORAL_TABLET | Freq: Four times a day (QID) | ORAL | Status: DC | PRN

## 2014-10-09 MED ORDER — HYDROCODONE-ACETAMINOPHEN 5-325 MG PO TABS
1.0000 | ORAL_TABLET | ORAL | Status: AC
  Administered 2014-10-10: 1 via ORAL
  Filled 2014-10-09: qty 1

## 2014-10-09 NOTE — ED Notes (Signed)
Patient transported to CT 

## 2014-10-09 NOTE — Discharge Instructions (Signed)

## 2014-10-09 NOTE — ED Notes (Signed)
Pt arrives via [facility] for c/o weakness and 3 falls today. Pt reports he fell onto his knees on carpet all 3 times, did not hit his head. Pt was d/c from hospital 2 weeks ago with pneumonia. Denies fever, chills, or pain. Pt alert, oriented. Nad.

## 2014-10-09 NOTE — ED Provider Notes (Signed)
CSN: [id number]     Arrival date & time 10/09/14  2135 History   First MD Initiated Contact with Patient 10/09/14 2150     Chief Complaint  Patient presents with  . Fatigue  . Fall   HPI Patient has been having trouble with pain in his left neck and left arm for a few weeks. He was seen in the emergency department and was told it could be a pinched nerve. Patient has been having persistent trouble with that. Occasional the pain is more severe in his left neck and left arm. Patient also feels that intermittently his left arm is weaker. Patient states he was having persistent episodes of pain today. He thinks because of the pain in his arm his knee started to give out. Patient fell to the ground a few times today. He denies any head injury or neck injury. He denies any numbness or weakness in his legs. Past Medical History  Diagnosis Date  . Atrial flutter     a. 07/2010 Status post caval tricuspid isthmus ablation by Dr. Midge Aver Metoprolol daily  . PAF (paroxysmal atrial fibrillation)     a. Recurrent after atrial flutter, currently controlled on flecainide plus diltiazem  . Hyperlipidemia     takes Pravastatin daily  . Diastolic CHF, chronic     a. 12/2012 EF 55-60%, diast dysfxn, triv MR, mildly dil LA/RA.  [name] Coronary artery disease, non-occlusive     a. 03/2010 Nonocclusive disease by cath, performed for ST elevations on ECG;  b. 06/2013 Lexi MV: EF 60%, no ischemia.  . Cervical radiculopathy due to degenerative joint disease of spine   . Claudication     a. lower ext dopplers 08/18/11-normal ABIs bilaterally with normal triphasic waveforms  . HTN (hypertension)     takes Diltiazem daily  . Dysrhythmia     HX OF ATRIAL IFB /FLUTTER takes Flecanide and Coumadin daily  . History of bronchitis     1998  . Weakness     numbness and tingling both hands  . Joint pain   . History of gastric ulcer   . Anemia     takes Ferrous Sulfate daily  . History of blood transfusion 1982    "when  I had stomach OR"  . Balance problem 01/2014  . Diabetes mellitus type II     takes Metformin and Lantus daily  . Pneumonia 1999  . CAP (community acquired pneumonia) 09/18/2014  . Arthritis     "all over"  . COPD (chronic obstructive pulmonary disease)    Past Surgical History  Procedure Laterality Date  . Atrial ablation surgery  08/05/10    CTI ablation for atrial flutter by JA  . Cardioversion  12/07/2010     Successful direct current cardioversion with atrial fibrillation to normal sinus rhythm  . Partial gastrectomy  1982    subtotal; "took out 30% for ulcers"  . Nm myoview ltd  07/22/2013    Normal EF ~60%, no ischemia or infarction.  . Colonoscopy [name] 12/02/2013    Procedure: COLONOSCOPY;  Surgeon: Irene Shipper, MD;  Location: [facility];  Service: Endoscopy;  Laterality: [name];  . Cardiac catheterization  2012/2013/2015    nl LV function, no occlusive CAD, PAF  . Knee bursectomy Left 1998  . Incision and drainage abscess / hematoma of bursa / knee / thigh Left 1998    knee  . Cataract extraction w/ intraocular lens  implant, bilateral Bilateral   . Yag laser application Bilateral   .  Carpal tunnel release Bilateral 01/30/2014    Procedure: BILATERAL CARPAL TUNNEL RELEASE;  Surgeon: Marianna Payment, MD;  Location: [facility];  Service: Orthopedics;  Laterality: Bilateral;  . Transthoracic echocardiogram  02/16/2014    EF 60%, no RWMA. - otherwise normal  . Laparoscopic cholecystectomy  03/2010  . Esophagogastroduodenoscopy [name] 09/22/2014    Procedure: ESOPHAGOGASTRODUODENOSCOPY (EGD);  Surgeon: Ronald Lobo, MD;  Location: [facility] ENDOSCOPY;  Service: Endoscopy;  Laterality: [name];   Family History  Problem Relation Age of Onset  . Cancer Mother   . Heart attack Father   . Heart attack Brother    History  Substance Use Topics  . Smoking status: Current Every Day Smoker -- 0.50 packs/day for 55 years    Types: Cigarettes  . Smokeless tobacco: Never Used     Comment: He's been  smoking between 0.5 and 2 ppd since age 31.  [name] Alcohol Use: 0.0 oz/week    0 Standard drinks or equivalent per week     Comment: "quit drinking in 1986"    Review of Systems  All other systems reviewed and are negative.     Allergies  Review of patient's allergies indicates no known allergies.  Home Medications   Prior to Admission medications   Medication Sig Start Date End Date Taking? Authorizing Provider  aspirin EC 81 MG tablet Take 81 mg by mouth daily.   Yes Historical Provider, MD  diazepam (VALIUM) 5 MG tablet Take 1 tablet (5 mg total) by mouth every 6 (six) hours as needed for muscle spasms. 09/29/14  Yes Daleen Bo, MD  diltiazem (CARDIZEM CD) 120 MG 24 hr capsule Take 1 capsule (120 mg total) by mouth daily. 04/03/14  Yes Erlene Quan, PA-C  ferrous sulfate 325 (65 FE) MG tablet Take 325 mg by mouth daily with breakfast.   Yes Historical Provider, MD  flecainide (TAMBOCOR) 150 MG tablet Take 1.5 tablets (225 mg total) by mouth 2 (two) times daily. 06/23/14  Yes Leonie Man, MD  hydrocortisone cream 1 % Apply 1 application topically 2 (two) times daily as needed for itching (rash).    Yes Historical Provider, MD  LANTUS SOLOSTAR 100 UNIT/ML Solostar Pen Inject 8 Units as directed 2 (two) times daily. Inject 8 units twice a day 08/31/14  Yes Historical Provider, MD  loratadine (CLARITIN) 10 MG tablet Take 10 mg by mouth 2 (two) times daily.    Yes Historical Provider, MD  metFORMIN (GLUCOPHAGE) 1000 MG tablet Take 500 mg by mouth 2 (two) times daily with a meal.    Yes Historical Provider, MD  metoprolol succinate (TOPROL XL) 25 MG 24 hr tablet Take 0.5 tablets (12.5 mg total) by mouth daily. Patient taking differently: Take 25 mg by mouth daily.  05/22/14  Yes Leonie Man, MD  Multiple Vitamin (MULTIVITAMIN WITH MINERALS) TABS tablet Take 1 tablet by mouth daily.   Yes Historical Provider, MD  nitroGLYCERIN (NITROSTAT) 0.4 MG SL tablet Place 1 tablet (0.4 mg total)  under the tongue every 5 (five) minutes x 3 doses as needed for chest pain. 05/22/14  Yes Leonie Man, MD  pantoprazole (PROTONIX) 40 MG tablet Take 1 tablet (40 mg total) by mouth daily. 09/24/14  Yes Thurnell Lose, MD  polyethylene glycol powder (MIRALAX) powder 6 capfuls tomorrow morning. Then 3 capfuls three times per day for 3 days. Then 1 capful three times per day for 3 days. Then 1 capful per day for 7 days. 08/27/14  Yes  Addison Lank, MD  pravastatin (PRAVACHOL) 40 MG tablet Take 40 mg by mouth daily at 6 PM.    Yes Historical Provider, MD  sucralfate (CARAFATE) 1 GM/10ML suspension Take 10 mLs (1 g total) by mouth 4 (four) times daily -  with meals and at bedtime. 09/24/14  Yes Thurnell Lose, MD  tamsulosin (FLOMAX) 0.4 MG CAPS capsule Take 0.4 mg by mouth daily.  05/22/14 05/22/15 Yes Historical Provider, MD  warfarin (COUMADIN) 5 MG tablet Take 7.5-10 mg by mouth daily at 6 PM. Take 2 tablet (10 mg) on Friday, take 1 1/2 tablets (7.5 mg) on Tuesday, Wednesday, Thursday, Saturday, Sunday, Monday   Yes Historical Provider, MD  albuterol (PROVENTIL HFA;VENTOLIN HFA) 108 (90 BASE) MCG/ACT inhaler Inhale 2 puffs into the lungs every 4 (four) hours as needed for wheezing or shortness of breath. 09/14/14   Sherwood Gambler, MD  azithromycin (ZITHROMAX) 500 MG tablet Take 1 tablet (500 mg total) by mouth daily. 09/24/14   Thurnell Lose, MD  HYDROcodone-acetaminophen (NORCO/VICODIN) 5-325 MG per tablet Take 1 tablet by mouth every 6 (six) hours as needed for moderate pain. 10/09/14   Dorie Rank, MD   BP 106/64 mmHg  Pulse 68  Temp(Src) 97.7 F (36.5 C)  Resp 13  SpO2 91% Physical Exam  Constitutional: No distress.  HENT:  Head: Normocephalic and atraumatic.  Right Ear: External ear normal.  Left Ear: External ear normal.  Eyes: Conjunctivae are normal. Right eye exhibits no discharge. Left eye exhibits no discharge. No scleral icterus.  Neck: Neck supple. No tracheal deviation present.   Cardiovascular: Normal rate, regular rhythm and intact distal pulses.   Pulmonary/Chest: Effort normal and breath sounds normal. No stridor. No respiratory distress. He has no wheezes. He has no rales.  Abdominal: Soft. Bowel sounds are normal. He exhibits no distension. There is no tenderness. There is no rebound and no guarding.  Musculoskeletal: He exhibits no edema or tenderness.  Neurological: He is alert. No cranial nerve deficit (no facial droop, extraocular movements intact, no slurred speech) or sensory deficit. He exhibits normal muscle tone. He displays no seizure activity. Coordination normal. GCS eye subscore is 4. GCS verbal subscore is 5. GCS motor subscore is 6.  Reflex Scores:      Patellar reflexes are 2+ on the right side and 2+ on the left side. Decreased grip strength left hand, 5 out of 5 plantar and dorsiflexion bilateral lower extremities  Skin: Skin is warm and dry. No rash noted.  Psychiatric: He has a normal mood and affect.  Nursing note and vitals reviewed.   ED Course  Procedures (including critical care time) Labs Review Labs Reviewed  BASIC METABOLIC PANEL - Abnormal; Notable for the following:    Glucose, Bld 127 (*)    All other components within normal limits  CBC - Abnormal; Notable for the following:    RBC 3.75 (*)    Hemoglobin 10.2 (*)    HCT 34.1 (*)    MCHC 29.9 (*)    RDW 20.0 (*)    All other components within normal limits  PROTIME-INR - Abnormal; Notable for the following:    Prothrombin Time 27.2 (*)    INR 2.56 (*)    All other components within normal limits  APTT - Abnormal; Notable for the following:    aPTT 43 (*)    All other components within normal limits  DIFFERENTIAL - Abnormal; Notable for the following:    Eosinophils Relative 9 (*)  Basophils Relative 2 (*)    All other components within normal limits  ETHANOL  URINALYSIS, ROUTINE W REFLEX MICROSCOPIC (NOT AT [facility])  I-STAT TROPOININ, ED    Imaging Review Ct  Head Wo Contrast  10/09/2014   CLINICAL DATA:  Weakness in the right arm. Neck pain. Three falls today. Patient fell onto is knees on carpet all 3 times and did not strike head. Discharge from hospital 2 weeks ago with pneumonia.  EXAM: CT HEAD WITHOUT CONTRAST  CT CERVICAL SPINE WITHOUT CONTRAST  TECHNIQUE: Multidetector CT imaging of the head and cervical spine was performed following the standard protocol without intravenous contrast. Multiplanar CT image reconstructions of the cervical spine were also generated.  COMPARISON:  CT head 05/31/2014 and 02/15/2014  FINDINGS: CT HEAD FINDINGS  Diffuse cerebral atrophy. Ventricular dilatation consistent with central atrophy. Low-attenuation changes in the deep white matter consistent with small vessel ischemia. Old lacunar infarcts in the deep white matter on the right. No mass effect or midline shift. No abnormal extra-axial fluid collections. Gray-white matter junctions are distinct. Basal cisterns are not effaced. No evidence of acute intracranial hemorrhage. No depressed skull fractures. Visualized paranasal sinuses and mastoid air cells are not opacified. Vascular calcifications.  CT CERVICAL SPINE FINDINGS  Mild reversal of cervical lordosis in the midcervical region likely degenerative change. No anterior subluxation. Posterior elements demonstrate normal alignment. Diffuse degenerative changes throughout the cervical spine with narrowed cervical interspaces and associated endplate hypertrophic changes. Degenerative changes throughout the facet joints. Uncovertebral spurring causes bone encroachment upon the neural foramina at multiple levels bilaterally. Prominent disc osteophyte complexes posteriorly at C3-4, C4-5, and C5-6 levels. No vertebral compression deformities. No prevertebral soft tissue swelling. C1-2 articulation appears intact. Degenerative changes at C1-2. No focal bone lesion or bone destruction. Bone cortex and trabecular architecture appear  intact. Probable degenerative cyst in the left lateral mass of C1. Soft tissue calcifications, likely ligamentous posterior to the spinous processes.  IMPRESSION: No acute intracranial abnormalities. Chronic atrophy and small vessel ischemic changes.  Degenerative changes throughout the cervical spine. No acute displaced fractures identified.   Electronically Signed   By: Lucienne Capers M.D.   On: 10/09/2014 22:27   Ct Cervical Spine Wo Contrast  10/09/2014   CLINICAL DATA:  Weakness in the right arm. Neck pain. Three falls today. Patient fell onto is knees on carpet all 3 times and did not strike head. Discharge from hospital 2 weeks ago with pneumonia.  EXAM: CT HEAD WITHOUT CONTRAST  CT CERVICAL SPINE WITHOUT CONTRAST  TECHNIQUE: Multidetector CT imaging of the head and cervical spine was performed following the standard protocol without intravenous contrast. Multiplanar CT image reconstructions of the cervical spine were also generated.  COMPARISON:  CT head 05/31/2014 and 02/15/2014  FINDINGS: CT HEAD FINDINGS  Diffuse cerebral atrophy. Ventricular dilatation consistent with central atrophy. Low-attenuation changes in the deep white matter consistent with small vessel ischemia. Old lacunar infarcts in the deep white matter on the right. No mass effect or midline shift. No abnormal extra-axial fluid collections. Gray-white matter junctions are distinct. Basal cisterns are not effaced. No evidence of acute intracranial hemorrhage. No depressed skull fractures. Visualized paranasal sinuses and mastoid air cells are not opacified. Vascular calcifications.  CT CERVICAL SPINE FINDINGS  Mild reversal of cervical lordosis in the midcervical region likely degenerative change. No anterior subluxation. Posterior elements demonstrate normal alignment. Diffuse degenerative changes throughout the cervical spine with narrowed cervical interspaces and associated endplate hypertrophic changes. Degenerative changes  throughout  the facet joints. Uncovertebral spurring causes bone encroachment upon the neural foramina at multiple levels bilaterally. Prominent disc osteophyte complexes posteriorly at C3-4, C4-5, and C5-6 levels. No vertebral compression deformities. No prevertebral soft tissue swelling. C1-2 articulation appears intact. Degenerative changes at C1-2. No focal bone lesion or bone destruction. Bone cortex and trabecular architecture appear intact. Probable degenerative cyst in the left lateral mass of C1. Soft tissue calcifications, likely ligamentous posterior to the spinous processes.  IMPRESSION: No acute intracranial abnormalities. Chronic atrophy and small vessel ischemic changes.  Degenerative changes throughout the cervical spine. No acute displaced fractures identified.   Electronically Signed   By: Lucienne Capers M.D.   On: 10/09/2014 22:27     EKG Interpretation   Date/Time:  Friday October 09 2014 21:46:54 EDT Ventricular Rate:  67 PR Interval:  303 QRS Duration: 102 QT Interval:  426 QTC Calculation: 450 R Axis:   -37 Text Interpretation:  Sinus or ectopic atrial rhythm Prolonged PR interval  Left axis deviation No significant change since last tracing Confirmed by  Kalani Baray  MD-J, Mikenzie Mccannon ([id number]) on 10/09/2014 9:49:42 PM      MDM   Final diagnoses:  Cervical radiculopathy  Degenerative arthritis of cervical spine    Pt is able to walk without difficulty.  I doubt acute CVA.  Pt does have a cervical radiculopathy.  CT scan of c spine shows arthritis and degenerative changes.  Discussed findings with patient.   Will refill his vicodin.  PCP is referring him to a spine surgeon.    Dorie Rank, MD 10/09/14 [phone]

## 2014-10-10 NOTE — ED Notes (Signed)
Spoke with Charge RN Raquel Sarna in regards to what resources we can provide to patient to help him get a walker or assistive device at home since we do not have walkers in the ED and Case Management is not here at this time of night. Charge RN advised to check with current ED doc in regards to an rx for a walker for patient.

## 2014-10-10 NOTE — ED Notes (Signed)
This RN spoke with Dr. Claudine Mouton about getting patient rx for walker, advised to get rx pad out of pyxis so that he could write rx for walker for patient.

## 2014-10-10 NOTE — ED Notes (Signed)
Patient attempted to call his daughter who did not answer. This RN asked patient if he needed transportation home, patient advised he would call a cab once he was discharged so that they did not get here before he was ready.

## 2014-10-12 IMAGING — CR DG HIP COMPLETE 2+V*R*
3 series · 3 of 3 positions shown · non-contrast
Comparison: Prior radiographs of the pelvis and right hip
03/16/2013

CLINICAL DATA: Fall, right hip pain

EXAM:
RIGHT HIP - COMPLETE 2+ VIEW

[t pelvis a.p.]
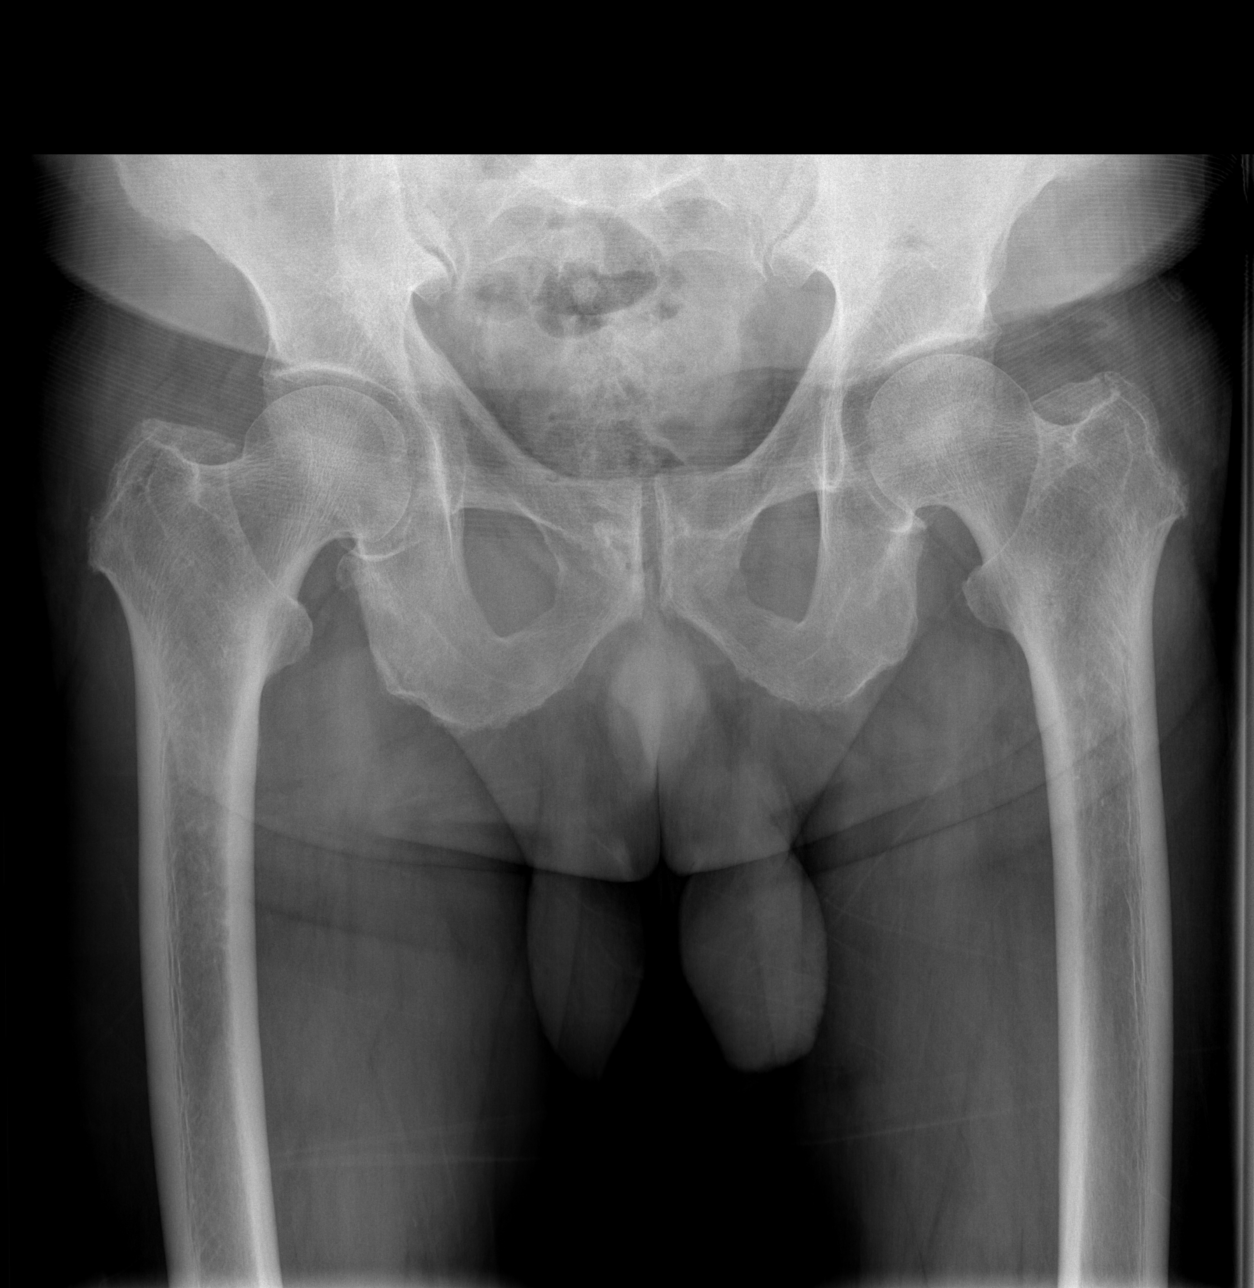

[t hip ap right]
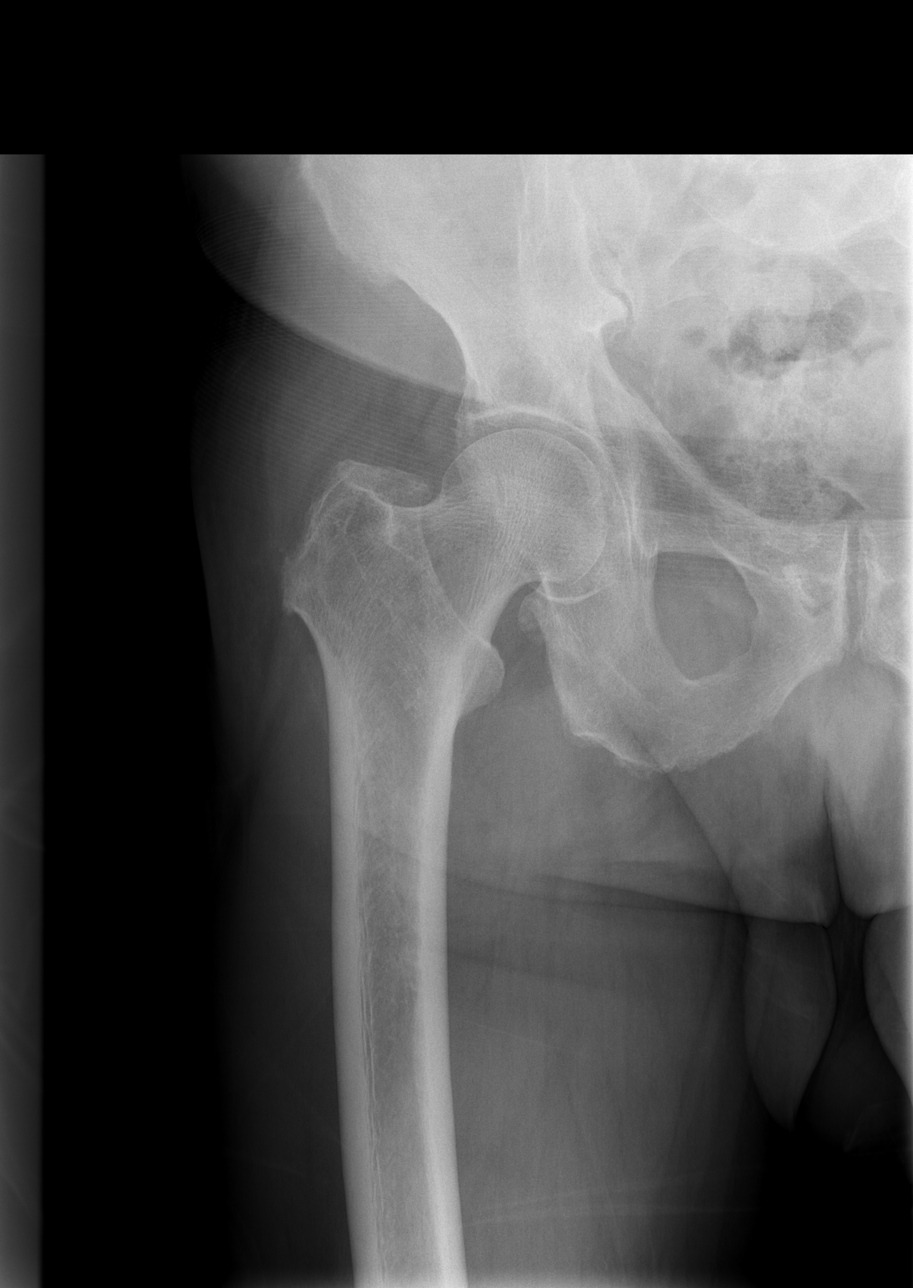

[t hip frog leg right]
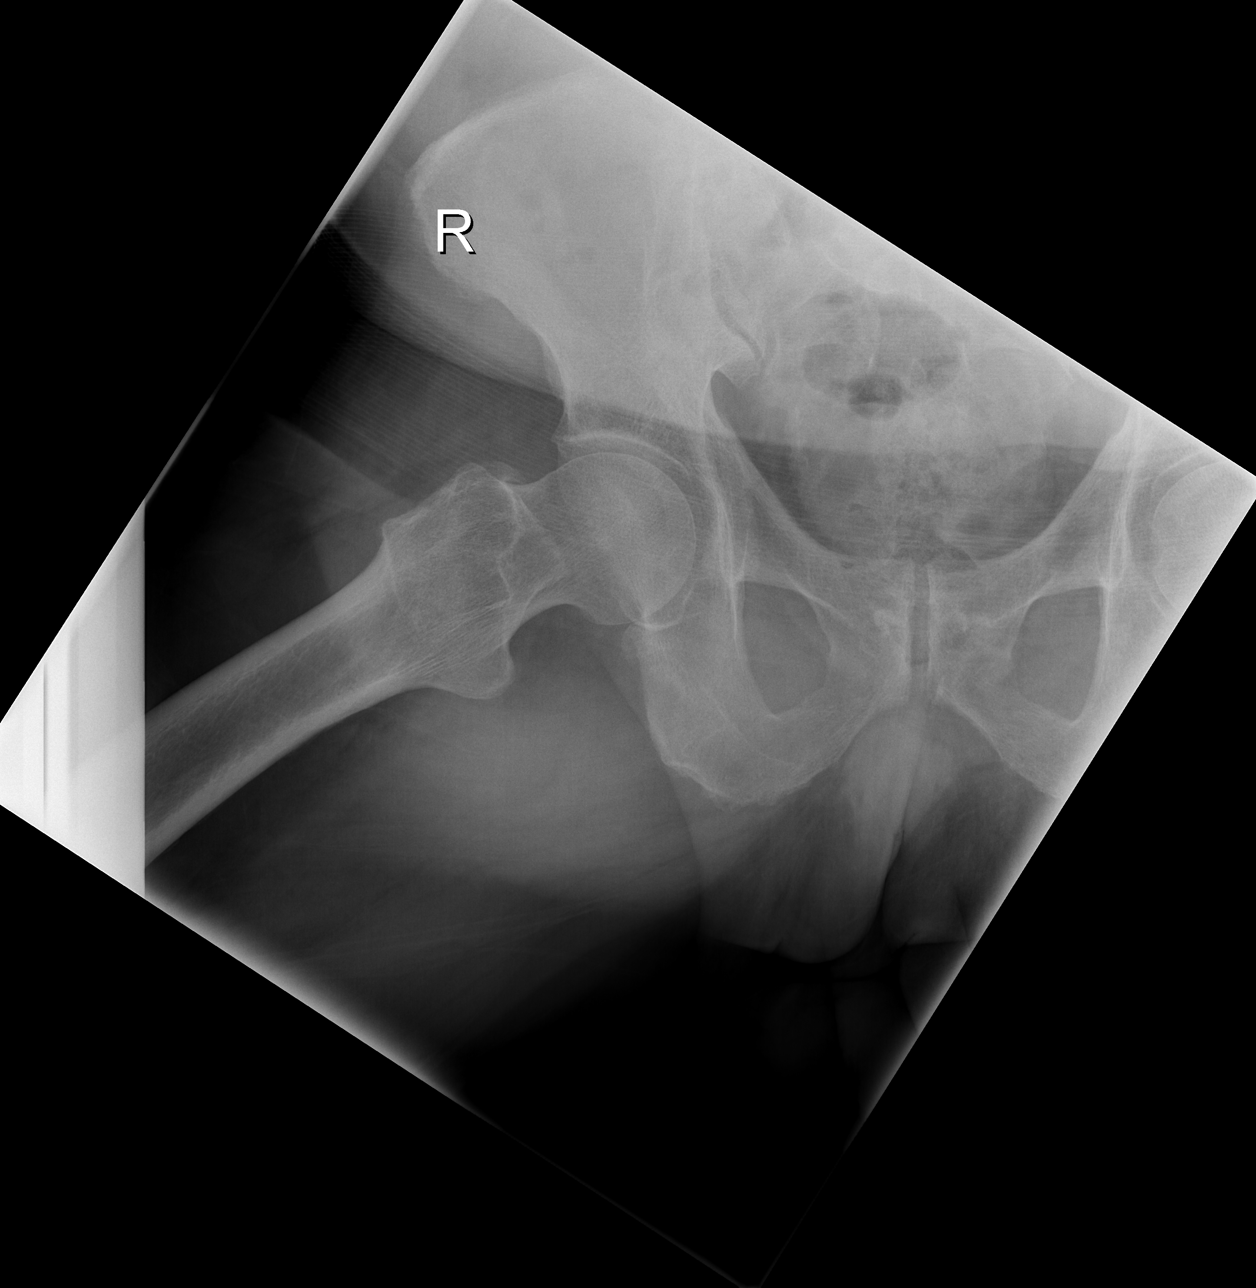

[3 of 3 positions shown; findings below may reference images not displayed]

FINDINGS: There is no evidence of hip fracture or dislocation. There is no
evidence of arthropathy or other focal bone abnormality.
IMPRESSION: Negative.

## 2014-10-26 ENCOUNTER — Other Ambulatory Visit: Payer: Self-pay | Admitting: Pharmacist Clinician (PhC)/ Clinical Pharmacy Specialist

## 2014-10-26 ENCOUNTER — Other Ambulatory Visit: Payer: Self-pay | Admitting: Cardiology

## 2014-10-26 NOTE — Telephone Encounter (Signed)
Rx(s) sent to pharmacy electronically.  

## 2014-11-06 ENCOUNTER — Ambulatory Visit (INDEPENDENT_AMBULATORY_CARE_PROVIDER_SITE_OTHER): Payer: Medicare Other | Admitting: Pharmacist Clinician (PhC)/ Clinical Pharmacy Specialist

## 2014-11-06 DIAGNOSIS — Z7901 Long term (current) use of anticoagulants: Secondary | ICD-10-CM | POA: Diagnosis not present

## 2014-11-06 DIAGNOSIS — I480 Paroxysmal atrial fibrillation: Secondary | ICD-10-CM | POA: Diagnosis not present

## 2014-11-06 LAB — POCT INR: INR: 3.3

## 2014-11-17 IMAGING — CR DG CHEST 2V
2 series · 2 of 2 positions shown · non-contrast
Comparison: 08/01/2013

CLINICAL DATA: Productive cough, congestion

EXAM:
CHEST  2 VIEW

[w chest pa]
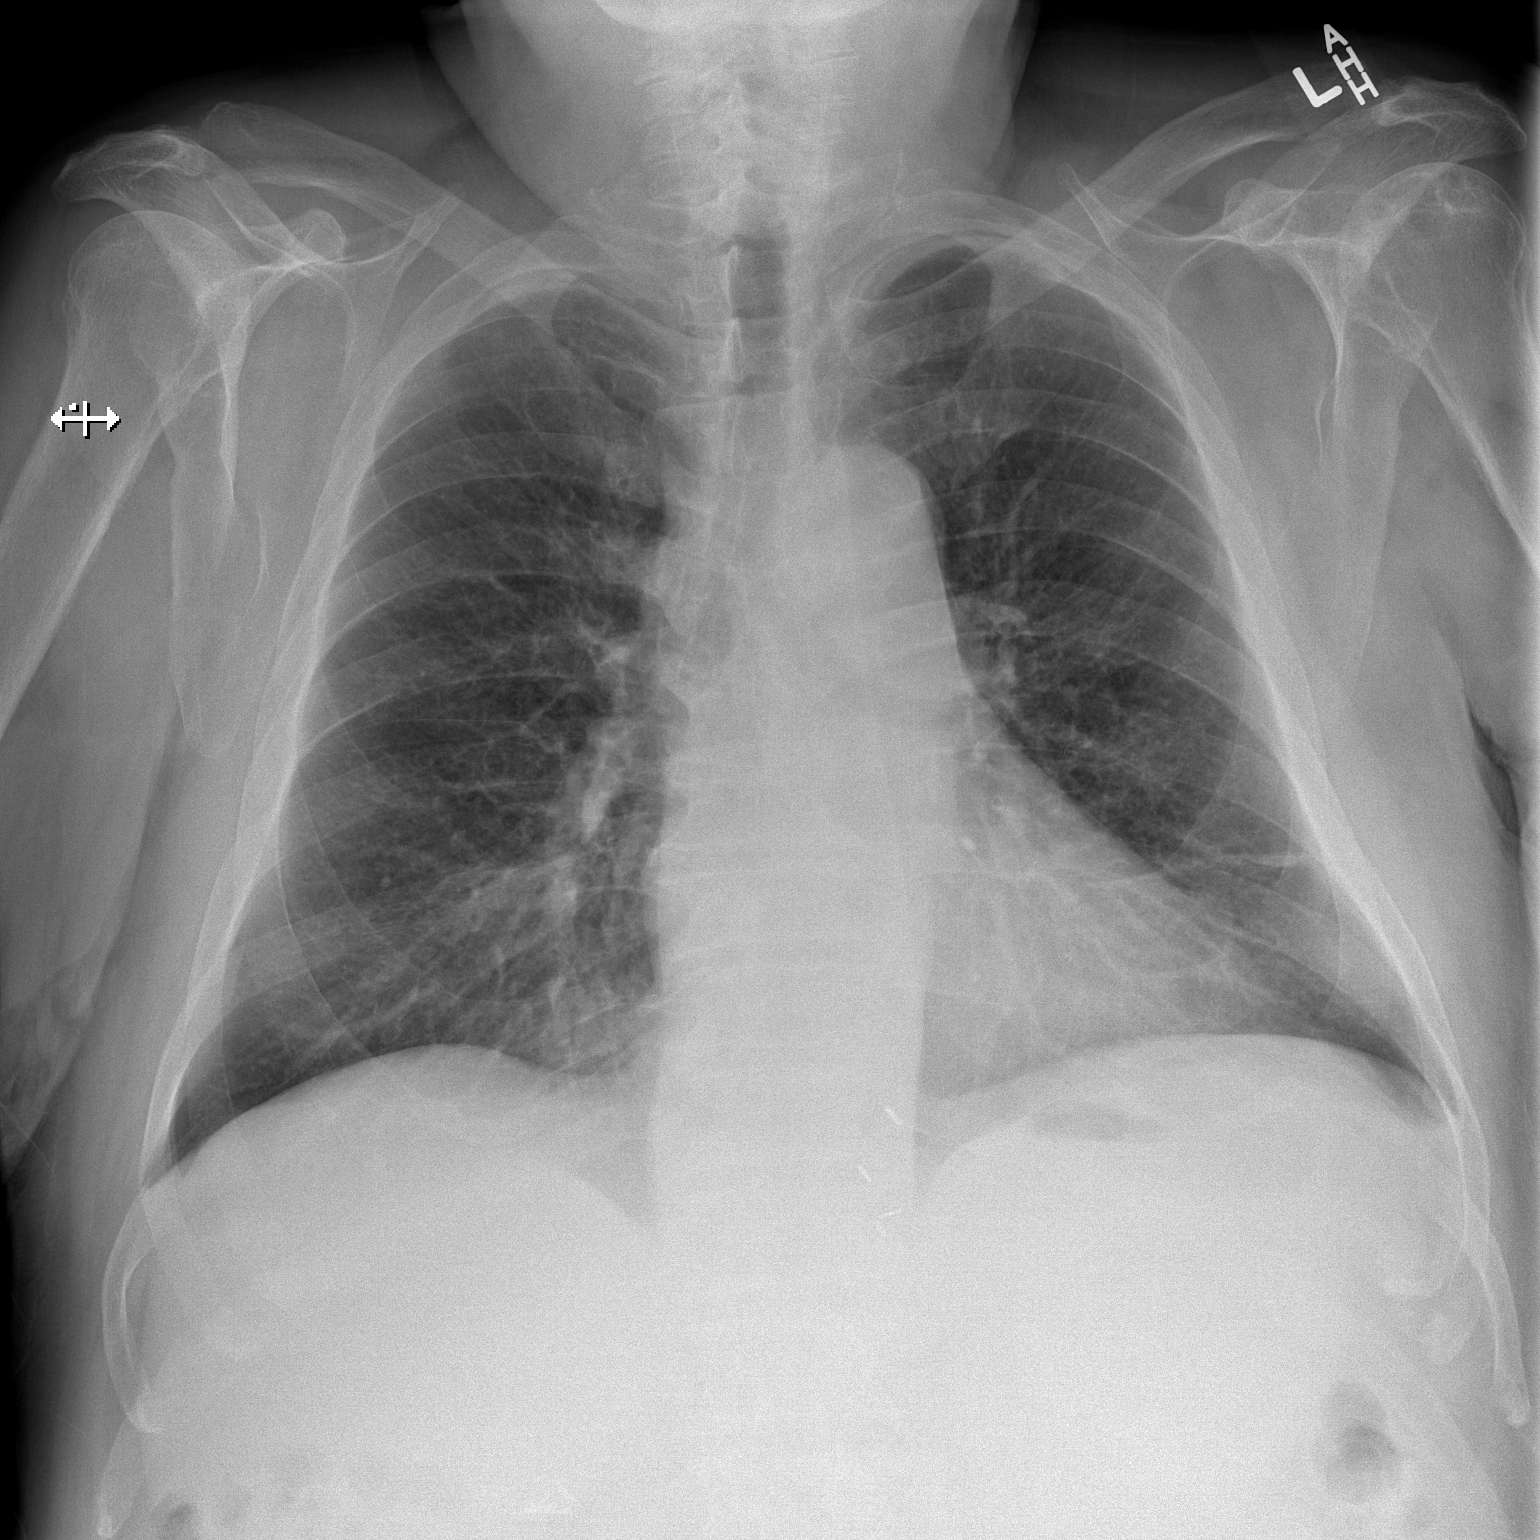

[w chest lat]
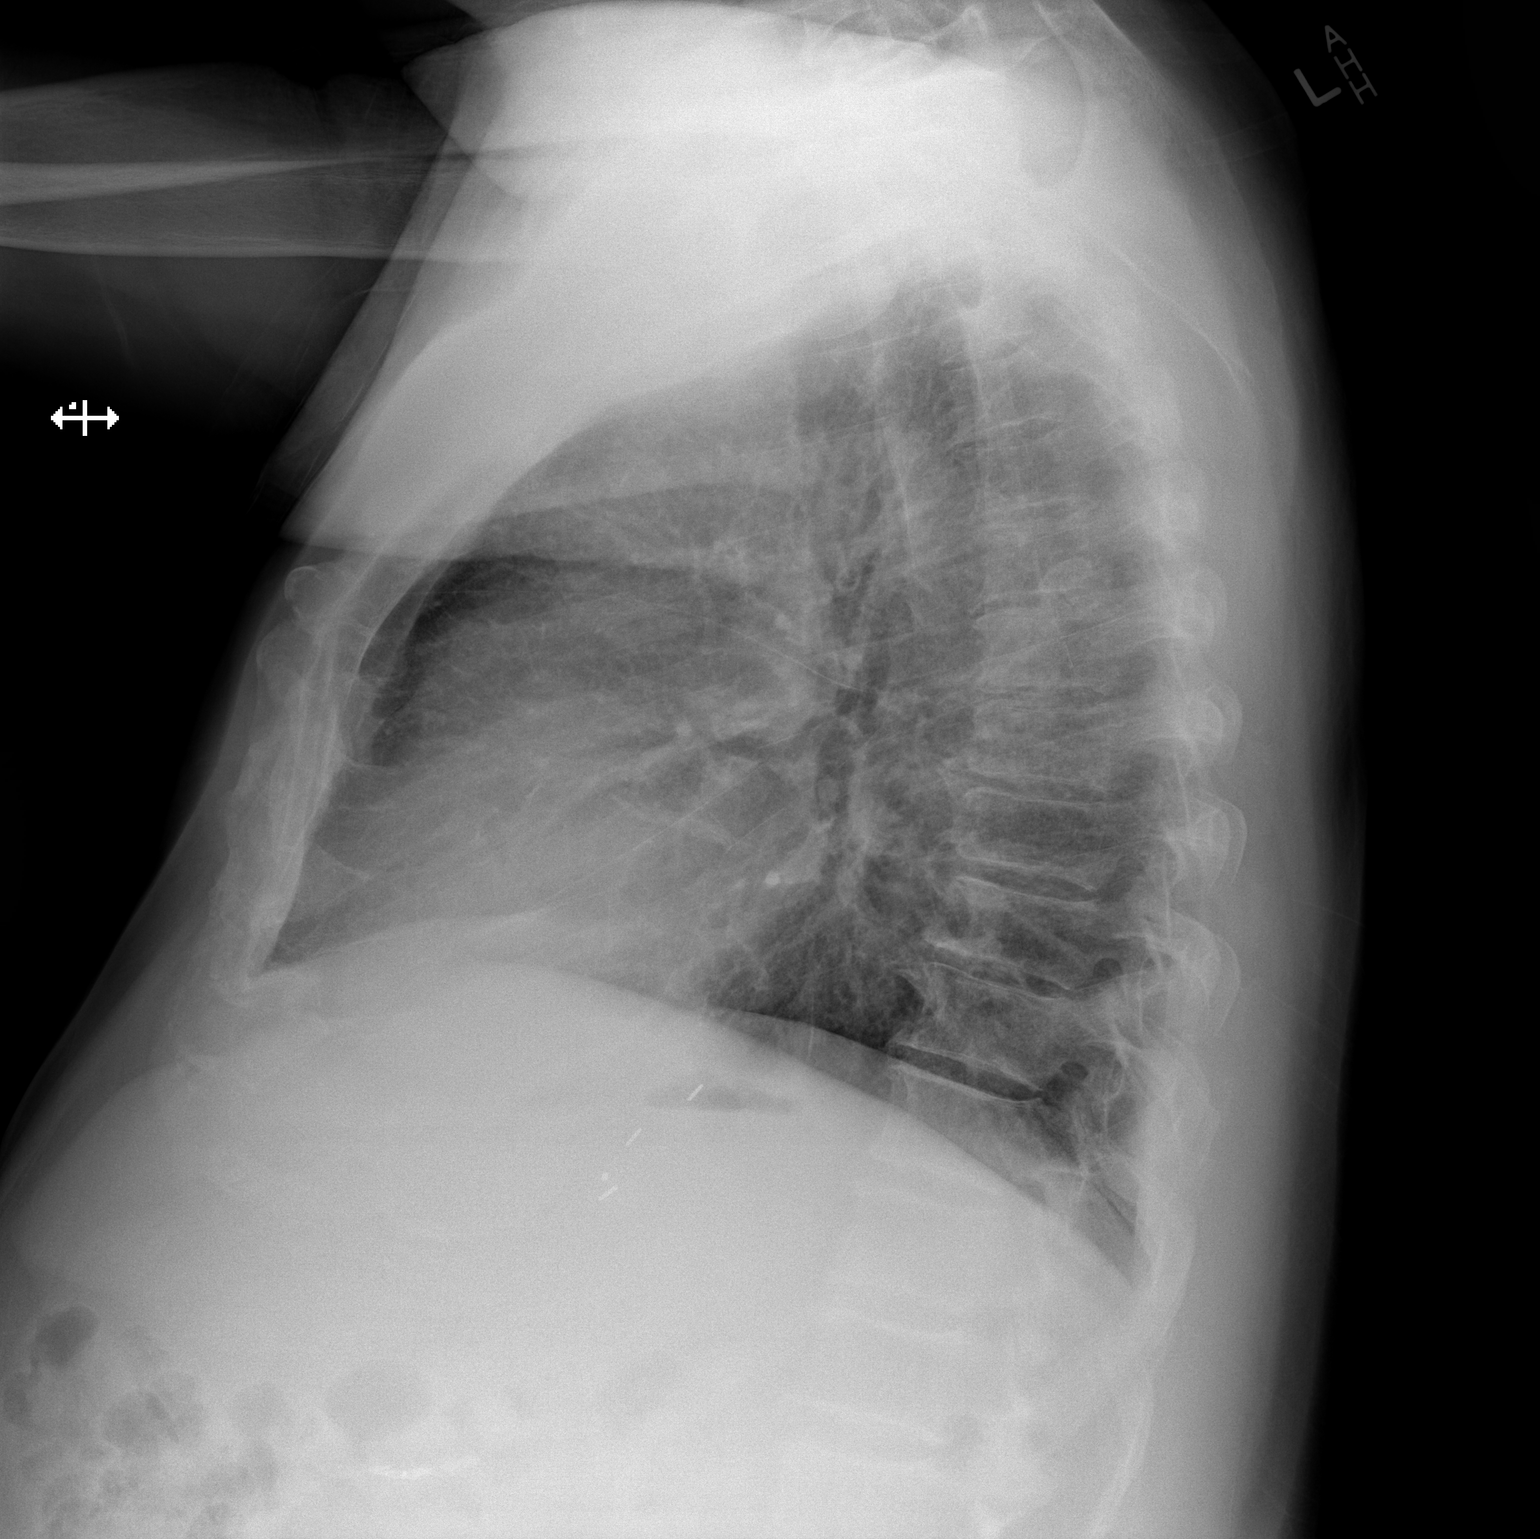

[2 of 2 positions shown; findings below may reference images not displayed]

FINDINGS: Cardiomediastinal silhouette is stable. Linear atelectasis or
scarring in lingula again noted. No acute infiltrate or pleural
effusion. No pulmonary edema. Mild degenerative changes thoracic
spine.
IMPRESSION: No active cardiopulmonary disease.

## 2014-11-20 ENCOUNTER — Ambulatory Visit: Payer: Medicare Other | Admitting: Pharmacist Clinician (PhC)/ Clinical Pharmacy Specialist

## 2014-11-20 ENCOUNTER — Ambulatory Visit (INDEPENDENT_AMBULATORY_CARE_PROVIDER_SITE_OTHER): Payer: Medicare Other | Admitting: Pharmacist Clinician (PhC)/ Clinical Pharmacy Specialist

## 2014-11-20 DIAGNOSIS — Z7901 Long term (current) use of anticoagulants: Secondary | ICD-10-CM

## 2014-11-20 DIAGNOSIS — I480 Paroxysmal atrial fibrillation: Secondary | ICD-10-CM

## 2014-11-20 LAB — POCT INR: INR: 4.5

## 2014-12-01 ENCOUNTER — Telehealth: Payer: Self-pay | Admitting: Cardiology

## 2014-12-01 MED ORDER — FLECAINIDE ACETATE 150 MG PO TABS: 225.0000 mg | ORAL_TABLET | Freq: Two times a day (BID) | ORAL | Status: DC

## 2014-12-01 NOTE — Telephone Encounter (Signed)
Pt says he needs #30,instead of 10,he takes 3 pills a day.

## 2014-12-01 NOTE — Telephone Encounter (Signed)
Daughter called again,says pt will need 61 pills now,his mail order will not come until the end of the month.

## 2014-12-01 NOTE — Telephone Encounter (Signed)
LM for emergency contact advising refill sent.

## 2014-12-01 NOTE — Telephone Encounter (Signed)
°  1. Which medications need to be refilled? Flecainide-Needs enough until his mail order comes in please.  2. Which pharmacy-[facility]-[phone] 3. Do they need a 30 day or 90 day supply? 10 4. Would they like a call back once the medication has been sent to the pharmacy? yes

## 2014-12-04 ENCOUNTER — Ambulatory Visit (INDEPENDENT_AMBULATORY_CARE_PROVIDER_SITE_OTHER): Payer: Medicare Other | Admitting: Pharmacist Clinician (PhC)/ Clinical Pharmacy Specialist

## 2014-12-04 DIAGNOSIS — Z7901 Long term (current) use of anticoagulants: Secondary | ICD-10-CM | POA: Diagnosis not present

## 2014-12-04 DIAGNOSIS — I480 Paroxysmal atrial fibrillation: Secondary | ICD-10-CM | POA: Diagnosis not present

## 2014-12-04 LAB — POCT INR: INR: 3.4

## 2014-12-19 ENCOUNTER — Encounter (HOSPITAL_COMMUNITY): Payer: Self-pay | Admitting: Emergency Medicine

## 2014-12-19 ENCOUNTER — Emergency Department (HOSPITAL_COMMUNITY)
Admission: EM | Admit: 2014-12-19 | Discharge: 2014-12-19 | Disposition: A | Discharge status: Home / Self Care | Payer: Medicare Other | Attending: Emergency Medicine | Admitting: Emergency Medicine

## 2014-12-19 ENCOUNTER — Emergency Department (HOSPITAL_COMMUNITY): Payer: Medicare Other

## 2014-12-19 DIAGNOSIS — E785 Hyperlipidemia, unspecified: Secondary | ICD-10-CM | POA: Insufficient documentation

## 2014-12-19 DIAGNOSIS — I480 Paroxysmal atrial fibrillation: Secondary | ICD-10-CM | POA: Diagnosis not present

## 2014-12-19 DIAGNOSIS — I5032 Chronic diastolic (congestive) heart failure: Secondary | ICD-10-CM | POA: Insufficient documentation

## 2014-12-19 DIAGNOSIS — Z7901 Long term (current) use of anticoagulants: Secondary | ICD-10-CM | POA: Insufficient documentation

## 2014-12-19 DIAGNOSIS — J441 Chronic obstructive pulmonary disease with (acute) exacerbation: Secondary | ICD-10-CM | POA: Diagnosis not present

## 2014-12-19 DIAGNOSIS — I2510 Atherosclerotic heart disease of native coronary artery without angina pectoris: Secondary | ICD-10-CM | POA: Diagnosis not present

## 2014-12-19 DIAGNOSIS — I10 Essential (primary) hypertension: Secondary | ICD-10-CM | POA: Insufficient documentation

## 2014-12-19 DIAGNOSIS — M1990 Unspecified osteoarthritis, unspecified site: Secondary | ICD-10-CM | POA: Diagnosis not present

## 2014-12-19 DIAGNOSIS — Z7982 Long term (current) use of aspirin: Secondary | ICD-10-CM | POA: Insufficient documentation

## 2014-12-19 DIAGNOSIS — Z79899 Other long term (current) drug therapy: Secondary | ICD-10-CM | POA: Diagnosis not present

## 2014-12-19 DIAGNOSIS — Z8701 Personal history of pneumonia (recurrent): Secondary | ICD-10-CM | POA: Diagnosis not present

## 2014-12-19 DIAGNOSIS — R05 Cough: Secondary | ICD-10-CM | POA: Insufficient documentation

## 2014-12-19 DIAGNOSIS — R059 Cough, unspecified: Secondary | ICD-10-CM

## 2014-12-19 DIAGNOSIS — Z862 Personal history of diseases of the blood and blood-forming organs and certain disorders involving the immune mechanism: Secondary | ICD-10-CM | POA: Insufficient documentation

## 2014-12-19 DIAGNOSIS — E119 Type 2 diabetes mellitus without complications: Secondary | ICD-10-CM | POA: Insufficient documentation

## 2014-12-19 LAB — BASIC METABOLIC PANEL
ANION GAP: 10 (ref 5–15)
BUN: 14 mg/dL (ref 6–20)
CALCIUM: 9.5 mg/dL (ref 8.9–10.3)
CHLORIDE: 101 mmol/L (ref 101–111)
CO2: 25 mmol/L (ref 22–32)
Creatinine, Ser: 0.95 mg/dL (ref 0.61–1.24)
GFR calc non Af Amer: >60 mL/min (ref >60)
GLUCOSE: 111 mg/dL — AB (ref 65–99)
POTASSIUM: 4.2 mmol/L (ref 3.5–5.1)
Sodium: 136 mmol/L (ref 135–145)

## 2014-12-19 LAB — CBC
HEMATOCRIT: 39.4 % (ref 39.0–52.0)
HEMOGLOBIN: 12.5 g/dL — AB (ref 13.0–17.0)
MCH: 29.3 pg (ref 26.0–34.0)
MCHC: 31.7 g/dL (ref 30.0–36.0)
MCV: 92.5 fL (ref 78.0–100.0)
Platelets: 240 10*3/uL (ref 150–400)
RBC: 4.26 MIL/uL (ref 4.22–5.81)
RDW: 13.1 % (ref 11.5–15.5)
WBC: 7.2 10*3/uL (ref 4.0–10.5)

## 2014-12-19 LAB — I-STAT TROPONIN, ED: Troponin i, poc: 0 ng/mL (ref 0.00–0.08)

## 2014-12-19 MED ORDER — DOXYCYCLINE HYCLATE 100 MG PO CAPS: 100.0000 mg | ORAL_CAPSULE | Freq: Two times a day (BID) | ORAL | Status: DC

## 2014-12-19 MED ORDER — ALBUTEROL SULFATE HFA 108 (90 BASE) MCG/ACT IN AERS
2.0000 | INHALATION_SPRAY | Freq: Once | RESPIRATORY_TRACT | Status: AC
  Administered 2014-12-19: 2 via RESPIRATORY_TRACT
  Filled 2014-12-19: qty 6.7

## 2014-12-19 NOTE — Discharge Instructions (Signed)
Take doxycycline as directed for complete week. Use your albuterol inhaler at home for cough and wheezing. Be sure to have your INR closely checked while on antibiotics.  Cough, Adult  A cough is a reflex that helps clear your throat and airways. It can help heal the body or may be a reaction to an irritated airway. A cough may only last 2 or 3 weeks (acute) or may last more than 8 weeks (chronic).  CAUSES Acute cough:  Viral or bacterial infections. Chronic cough:  Infections.  Allergies.  Asthma.  Post-nasal drip.  Smoking.  Heartburn or acid reflux.  Some medicines.  Chronic lung problems (COPD).  Cancer. SYMPTOMS   Cough.  Fever.  Chest pain.  Increased breathing rate.  High-pitched whistling sound when breathing (wheezing).  Colored mucus that you cough up (sputum). TREATMENT   A bacterial cough may be treated with antibiotic medicine.  A viral cough must run its course and will not respond to antibiotics.  Your caregiver may recommend other treatments if you have a chronic cough. HOME CARE INSTRUCTIONS   Only take over-the-counter or prescription medicines for pain, discomfort, or fever as directed by your caregiver. Use cough suppressants only as directed by your caregiver.  Use a cold steam vaporizer or humidifier in your bedroom or home to help loosen secretions.  Sleep in a semi-upright position if your cough is worse at night.  Rest as needed.  Stop smoking if you smoke. SEEK IMMEDIATE MEDICAL CARE IF:   You have pus in your sputum.  Your cough starts to worsen.  You cannot control your cough with suppressants and are losing sleep.  You begin coughing up blood.  You have difficulty breathing.  You develop pain which is getting worse or is uncontrolled with medicine.  You have a fever. MAKE SURE YOU:   Understand these instructions.  Will watch your condition.  Will get help right away if you are not doing well or get  worse. Document Released: 09/09/2010 Document Revised: 06/05/2011 Document Reviewed: 09/09/2010 [facility] Patient Information 2015 [place], [place]. This information is not intended to replace advice given to you by your health care provider. Make sure you discuss any questions you have with your health care provider. Acute Bronchitis Bronchitis is inflammation of the airways that extend from the windpipe into the lungs (bronchi). The inflammation often causes mucus to develop. This leads to a cough, which is the most common symptom of bronchitis.  In acute bronchitis, the condition usually develops suddenly and goes away over time, usually in a couple weeks. Smoking, allergies, and asthma can make bronchitis worse. Repeated episodes of bronchitis may cause further lung problems.  CAUSES Acute bronchitis is most often caused by the same virus that causes a cold. The virus can spread from person to person (contagious) through coughing, sneezing, and touching contaminated objects. SIGNS AND SYMPTOMS   Cough.   Fever.   Coughing up mucus.   Body aches.   Chest congestion.   Chills.   Shortness of breath.   Sore throat.  DIAGNOSIS  Acute bronchitis is usually diagnosed through a physical exam. Your health care provider will also ask you questions about your medical history. Tests, such as chest X-rays, are sometimes done to rule out other conditions.  TREATMENT  Acute bronchitis usually goes away in a couple weeks. Oftentimes, no medical treatment is necessary. Medicines are sometimes given for relief of fever or cough. Antibiotic medicines are usually not needed but may be prescribed in  certain situations. In some cases, an inhaler may be recommended to help reduce shortness of breath and control the cough. A cool mist vaporizer may also be used to help thin bronchial secretions and make it easier to clear the chest.  HOME CARE INSTRUCTIONS  Get plenty of rest.   Drink enough  fluids to keep your urine clear or pale yellow (unless you have a medical condition that requires fluid restriction). Increasing fluids may help thin your respiratory secretions (sputum) and reduce chest congestion, and it will prevent dehydration.   Take medicines only as directed by your health care provider.  If you were prescribed an antibiotic medicine, finish it all even if you start to feel better.  Avoid smoking and secondhand smoke. Exposure to cigarette smoke or irritating chemicals will make bronchitis worse. If you are a smoker, consider using nicotine gum or skin patches to help control withdrawal symptoms. Quitting smoking will help your lungs heal faster.   Reduce the chances of another bout of acute bronchitis by washing your hands frequently, avoiding people with cold symptoms, and trying not to touch your hands to your mouth, nose, or eyes.   Keep all follow-up visits as directed by your health care provider.  SEEK MEDICAL CARE IF: Your symptoms do not improve after 1 week of treatment.  SEEK IMMEDIATE MEDICAL CARE IF:  You develop an increased fever or chills.   You have chest pain.   You have severe shortness of breath.  You have bloody sputum.   You develop dehydration.  You faint or repeatedly feel like you are going to pass out.  You develop repeated vomiting.  You develop a severe headache. MAKE SURE YOU:   Understand these instructions.  Will watch your condition.  Will get help right away if you are not doing well or get worse. Document Released: 04/20/2004 Document Revised: 07/28/2013 Document Reviewed: 09/03/2012 [facility] Patient Information 2015 [place], [place]. This information is not intended to replace advice given to you by your health care provider. Make sure you discuss any questions you have with your health care provider.

## 2014-12-19 NOTE — ED Notes (Signed)
Sob and cough x 2 weeks, also c/o chronic low back pain and new neck pain.  Has had bronchitis, wants to be evaluated again because he is not getting better.

## 2014-12-19 NOTE — ED Provider Notes (Signed)
CSN: [id number]     Arrival date & time 12/19/14  1243 History   First MD Initiated Contact with Patient 12/19/14 1521     Chief Complaint  Patient presents with  . Cough  . Shortness of Breath  . Neck Pain     (Consider location/radiation/quality/duration/timing/severity/associated sxs/prior Treatment) HPI Comments: 74 year old male complaining of cough 1.5 weeks. Initially the cough initially dry and has developed into a productive cough with brown/gray sputum. Reports a history of bronchitis back in June and states this feels similar. States he feels like he is wheezing, had his daughter listen to his lungs and she told him it "may be the beginning of bronchitis". No aggravating or alleviating factors. Admits to associated shortness of breath when coughing. Denies chest pain. Also complaining of ongoing chronic low back pain and right-sided neck pain which is related to "the cervical radiculopathy I was diagnosed with a few months back". No extremity numbness, tingling or weakness.  Patient is a 74 y.o. male presenting with cough, shortness of breath, and neck pain. The history is provided by the patient.  Cough Associated symptoms: shortness of breath   Shortness of Breath Associated symptoms: cough and neck pain (chronic)   Neck Pain   Past Medical History  Diagnosis Date  . Atrial flutter     a. 07/2010 Status post caval tricuspid isthmus ablation by Dr. Midge [name] Metoprolol daily  . PAF (paroxysmal atrial fibrillation)     a. Recurrent after atrial flutter, currently controlled on flecainide plus diltiazem  . Hyperlipidemia     takes Pravastatin daily  . Diastolic CHF, chronic     a. 12/2012 EF 55-60%, diast dysfxn, triv MR, mildly dil LA/RA.  [name] Coronary artery disease, non-occlusive     a. 03/2010 Nonocclusive disease by cath, performed for ST elevations on ECG;  b. 06/2013 Lexi MV: EF 60%, no ischemia.  . Cervical radiculopathy due to degenerative joint disease of spine    . Claudication     a. lower ext dopplers 08/18/11-normal ABIs bilaterally with normal triphasic waveforms  . HTN (hypertension)     takes Diltiazem daily  . Dysrhythmia     HX OF ATRIAL IFB /FLUTTER takes Flecanide and Coumadin daily  . History of bronchitis     1998  . Weakness     numbness and tingling both hands  . Joint pain   . History of gastric ulcer   . Anemia     takes Ferrous Sulfate daily  . History of blood transfusion 1982    "when I had stomach OR"  . Balance problem 01/2014  . Diabetes mellitus type II     takes Metformin and Lantus daily  . Pneumonia 1999  . CAP (community acquired pneumonia) 09/18/2014  . Arthritis     "all over"  . COPD (chronic obstructive pulmonary disease)    Past Surgical History  Procedure Laterality Date  . Atrial ablation surgery  08/05/10    CTI ablation for atrial flutter by [name]  . Cardioversion  12/07/2010     Successful direct current cardioversion with atrial fibrillation to normal sinus rhythm  . Partial gastrectomy  1982    subtotal; "took out 30% for ulcers"  . Nm myoview ltd  07/22/2013    Normal EF ~60%, no ischemia or infarction.  . Colonoscopy [name] 12/02/2013    Procedure: COLONOSCOPY;  Surgeon: Trevor Shipper, MD;  Location: [place];  Service: Endoscopy;  Laterality: [name];  . Cardiac catheterization  2012/2013/2015  nl LV function, no occlusive CAD, PAF  . Knee bursectomy Left 1998  . Incision and drainage abscess / hematoma of bursa / knee / thigh Left 1998    knee  . Cataract extraction w/ intraocular lens  implant, bilateral Bilateral   . Yag laser application Bilateral   . Carpal tunnel release Bilateral 01/30/2014    Procedure: BILATERAL CARPAL TUNNEL RELEASE;  Surgeon: Trevor Payment, MD;  Location: [place];  Service: Orthopedics;  Laterality: Bilateral;  . Transthoracic echocardiogram  02/16/2014    EF 60%, no RWMA. - otherwise normal  . Laparoscopic cholecystectomy  03/2010  . Esophagogastroduodenoscopy [name]  09/22/2014    Procedure: ESOPHAGOGASTRODUODENOSCOPY (EGD);  Surgeon: Trevor Lobo, MD;  [facility] ENDOSCOPY;  Service: Endoscopy;  Laterality: [name];   Family History  Problem Relation Age of Onset  . Cancer Mother   . Heart attack Father   . Heart attack Brother    Social History  Substance Use Topics  . Smoking status: Current Every Day Smoker -- 0.50 packs/day for 55 years    Types: Cigarettes  . Smokeless tobacco: Never Used     Comment: He's been smoking between 0.5 and 2 ppd since age 72.  [name] Alcohol Use: 0.0 oz/week    0 Standard drinks or equivalent per week     Comment: "quit drinking in 1986"    Review of Systems  Respiratory: Positive for cough and shortness of breath.   Musculoskeletal: Positive for back pain (chronic) and neck pain (chronic).  All other systems reviewed and are negative.     Allergies  Review of patient's allergies indicates no known allergies.  Home Medications   Prior to Admission medications   Medication Sig Start Date End Date Taking? Authorizing Provider  albuterol (PROVENTIL HFA;VENTOLIN HFA) 108 (90 BASE) MCG/ACT inhaler Inhale 2 puffs into the lungs every 4 (four) hours as needed for wheezing or shortness of breath. 09/14/14  Yes Trevor Gambler, MD  aspirin EC 81 MG tablet Take 81 mg by mouth daily.   Yes Historical Provider, MD  diltiazem (CARDIZEM CD) 120 MG 24 hr capsule Take 1 capsule (120 mg total) by mouth daily. 04/03/14  Yes Trevor Quan, PA-C  ferrous sulfate 325 (65 FE) MG tablet Take 325 mg by mouth daily with breakfast.   Yes Historical Provider, MD  flecainide (TAMBOCOR) 150 MG tablet Take 1.5 tablets (225 mg total) by mouth 2 (two) times daily. 12/01/14  Yes Trevor Man, MD  hydrocortisone cream 1 % Apply 1 application topically 2 (two) times daily as needed for itching (rash).    Yes Historical Provider, MD  LANTUS SOLOSTAR 100 UNIT/ML Solostar Pen Inject 4 Units as directed every 6 (six) hours.  08/31/14  Yes Historical  Provider, MD  loratadine (CLARITIN) 10 MG tablet Take 10 mg by mouth 2 (two) times daily.    Yes Historical Provider, MD  meloxicam (MOBIC) 15 MG tablet Take 15 mg by mouth daily. 11/27/14  Yes Historical Provider, MD  metFORMIN (GLUCOPHAGE) 1000 MG tablet Take 1,000 mg by mouth 2 (two) times daily with a meal.    Yes Historical Provider, MD  metoprolol succinate (TOPROL XL) 25 MG 24 hr tablet Take 0.5 tablets (12.5 mg total) by mouth daily. Patient taking differently: Take 12.5 mg by mouth at bedtime.  05/22/14  Yes Trevor Man, MD  Multiple Vitamin (MULTIVITAMIN WITH MINERALS) TABS tablet Take 1 tablet by mouth daily.   Yes Historical Provider, MD  nitroGLYCERIN (NITROSTAT)  0.4 MG SL tablet Place 1 tablet (0.4 mg total) under the tongue every 5 (five) minutes x 3 doses as needed for chest pain. 05/22/14  Yes Trevor Man, MD  pantoprazole (PROTONIX) 40 MG tablet Take 1 tablet (40 mg total) by mouth daily. 09/24/14  Yes Thurnell Lose, MD  polyethylene glycol powder (MIRALAX) powder 6 capfuls tomorrow morning. Then 3 capfuls three times per day for 3 days. Then 1 capful three times per day for 3 days. Then 1 capful per day for 7 days. Patient taking differently: Take 0.5 Containers by mouth daily as needed for mild constipation or moderate constipation.  08/27/14  Yes Addison Lank, MD  pravastatin (PRAVACHOL) 40 MG tablet TAKE 1 TABLET EVERY EVENING 10/26/14  Yes Trevor Man, MD  warfarin (COUMADIN) 5 MG tablet Take 1.5 to 2 tablets by mouth daily as directed by coumadin clinic Patient taking differently: Take 5-7.5 mg by mouth daily at 6 PM. Take 5 mg by mouth daily Mon, Wed, and Fri. Take 7.5 mg by mouth daily on all other days. 10/27/14  Yes Troy Sine, MD  diazepam (VALIUM) 5 MG tablet Take 1 tablet (5 mg total) by mouth every 6 (six) hours as needed for muscle spasms. Patient not taking: Reported on 12/19/2014 09/29/14   Daleen Bo, MD  doxycycline (VIBRAMYCIN) 100 MG capsule Take 1  capsule (100 mg total) by mouth 2 (two) times daily. One po bid x 7 days 12/19/14   Carman Ching, PA-C  HYDROcodone-acetaminophen (NORCO/VICODIN) 5-325 MG per tablet Take 1 tablet by mouth every 6 (six) hours as needed for moderate pain. Patient not taking: Reported on 12/19/2014 10/09/14   Dorie Rank, MD  sucralfate (CARAFATE) 1 GM/10ML suspension Take 10 mLs (1 g total) by mouth 4 (four) times daily -  with meals and at bedtime. Patient not taking: Reported on 12/19/2014 09/24/14   Thurnell Lose, MD   BP 125/69 mmHg  Pulse 77  Temp(Src) 98.1 F (36.7 C) (Oral)  Resp 15  Ht 5\' 6"  (1.676 m)  Wt 190 lb (86.183 kg)  BMI 30.68 kg/m2  SpO2 98% Physical Exam  Constitutional: He is oriented to person, place, and time. He appears well-developed and well-nourished. No distress.  HENT:  Head: Normocephalic and atraumatic.  Mouth/Throat: Oropharynx is clear and moist.  Eyes: Conjunctivae and EOM are normal. Pupils are equal, round, and reactive to light.  Neck: Normal range of motion. Neck supple. No JVD present.  Cardiovascular: Normal rate, regular rhythm, normal heart sounds and intact distal pulses.   No extremity edema.  Pulmonary/Chest: Effort normal and breath sounds normal. No respiratory distress. He has no decreased breath sounds. He has no wheezes. He has no rhonchi. He has no rales.  Abdominal: Soft. Bowel sounds are normal. There is no tenderness.  Musculoskeletal: Normal range of motion. He exhibits no edema.  Neurological: He is alert and oriented to person, place, and time. He has normal strength. No sensory deficit.  Speech fluent, goal oriented. Moves extremities without ataxia. Equal grip strength bilateral.  Skin: Skin is warm and dry. He is not diaphoretic.  Psychiatric: He has a normal mood and affect. His behavior is normal.  Nursing note and vitals reviewed.   ED Course  Procedures (including critical care time) Labs Review Labs Reviewed  CBC - Abnormal; Notable for  the following:    Hemoglobin 12.5 (*)    All other components within normal limits  BASIC METABOLIC PANEL - Abnormal;  Notable for the following:    Glucose, Bld 111 (*)    All other components within normal limits  I-STAT TROPOININ, ED    Imaging Review Dg Chest 2 View  12/19/2014   CLINICAL DATA:  Productive cough and shortness of breath for 2 weeks  EXAM: CHEST - 2 VIEW  COMPARISON:  10/30/2014  FINDINGS: Cardiac shadow is within normal limits. The lungs are well aerated bilaterally. Bilateral nipple shadows are again noted. No focal infiltrate or sizable effusion is seen. Degenerative changes of the thoracic spine are noted.  IMPRESSION: No active disease.   Electronically Signed   By: Inez Catalina M.D.   On: 12/19/2014 14:17   I have personally reviewed and evaluated these images and lab results as part of my medical decision-making.   EKG Interpretation   Date/Time:  Saturday December 19 2014 12:49:28 EDT Ventricular Rate:  94 PR Interval:  274 QRS Duration: 100 QT Interval:  362 QTC Calculation: 452 R Axis:   -55 Text Interpretation:  Sinus rhythm with 1st degree A-V block with  Premature atrial complexes with Abberant conduction Left axis deviation  Abnormal ECG No significant change was found Confirmed by Wyvonnia Dusky  MD,  STEPHEN ([id number]) on 12/19/2014 4:30:46 PM      MDM   Final diagnoses:  Cough   Nontoxic appearing, NAD. AF VSS. Lungs clear on arrival. Hx of COPD/bronchitis. Similar symptoms in June. No hypoxia. CXR clear. Labs without acute finding. No CP. Does not appear SOB. Doubt PE, very low suspicion. Will treat with doxy. Probable URI causing cough, however increased risk of acute bronchitis flair with COPD/smoking. F/u with PCP. Advised use of albuterol inhaler. Stable for d/c. Return precautions given. Patient states understanding of treatment care plan and is agreeable.  Discussed with attending Dr. Wyvonnia Dusky who also evaluated patient and agrees with plan of  care.  Carman Ching, PA-C 12/19/14 [place], MD 12/19/14 [phone]

## 2014-12-25 ENCOUNTER — Ambulatory Visit (INDEPENDENT_AMBULATORY_CARE_PROVIDER_SITE_OTHER): Payer: Medicare Other | Admitting: Pharmacist Clinician (PhC)/ Clinical Pharmacy Specialist

## 2014-12-25 DIAGNOSIS — I480 Paroxysmal atrial fibrillation: Secondary | ICD-10-CM

## 2014-12-25 DIAGNOSIS — Z7901 Long term (current) use of anticoagulants: Secondary | ICD-10-CM | POA: Diagnosis not present

## 2014-12-25 LAB — POCT INR: INR: 1.8

## 2015-01-15 ENCOUNTER — Ambulatory Visit (INDEPENDENT_AMBULATORY_CARE_PROVIDER_SITE_OTHER): Payer: Medicare Other | Admitting: Pharmacist Clinician (PhC)/ Clinical Pharmacy Specialist

## 2015-01-15 DIAGNOSIS — Z7901 Long term (current) use of anticoagulants: Secondary | ICD-10-CM

## 2015-01-15 DIAGNOSIS — I480 Paroxysmal atrial fibrillation: Secondary | ICD-10-CM | POA: Diagnosis not present

## 2015-01-15 LAB — POCT INR: INR: 2

## 2015-01-17 ENCOUNTER — Emergency Department (HOSPITAL_COMMUNITY): Payer: Medicare Other

## 2015-01-17 ENCOUNTER — Encounter (HOSPITAL_COMMUNITY): Payer: Self-pay | Admitting: *Deleted

## 2015-01-17 ENCOUNTER — Emergency Department (HOSPITAL_COMMUNITY)
Admission: EM | Admit: 2015-01-17 | Discharge: 2015-01-17 | Disposition: A | Discharge status: Home / Self Care | Payer: Medicare Other | Attending: Emergency Medicine | Admitting: Emergency Medicine

## 2015-01-17 DIAGNOSIS — M1990 Unspecified osteoarthritis, unspecified site: Secondary | ICD-10-CM | POA: Diagnosis not present

## 2015-01-17 DIAGNOSIS — I5032 Chronic diastolic (congestive) heart failure: Secondary | ICD-10-CM | POA: Insufficient documentation

## 2015-01-17 DIAGNOSIS — Z794 Long term (current) use of insulin: Secondary | ICD-10-CM | POA: Insufficient documentation

## 2015-01-17 DIAGNOSIS — Z720 Tobacco use: Secondary | ICD-10-CM | POA: Insufficient documentation

## 2015-01-17 DIAGNOSIS — I4892 Unspecified atrial flutter: Secondary | ICD-10-CM | POA: Insufficient documentation

## 2015-01-17 DIAGNOSIS — M545 Low back pain: Secondary | ICD-10-CM | POA: Insufficient documentation

## 2015-01-17 DIAGNOSIS — J449 Chronic obstructive pulmonary disease, unspecified: Secondary | ICD-10-CM | POA: Insufficient documentation

## 2015-01-17 DIAGNOSIS — I10 Essential (primary) hypertension: Secondary | ICD-10-CM | POA: Insufficient documentation

## 2015-01-17 DIAGNOSIS — E785 Hyperlipidemia, unspecified: Secondary | ICD-10-CM | POA: Insufficient documentation

## 2015-01-17 DIAGNOSIS — Z9889 Other specified postprocedural states: Secondary | ICD-10-CM | POA: Diagnosis not present

## 2015-01-17 DIAGNOSIS — Z79899 Other long term (current) drug therapy: Secondary | ICD-10-CM | POA: Diagnosis not present

## 2015-01-17 DIAGNOSIS — Z8701 Personal history of pneumonia (recurrent): Secondary | ICD-10-CM | POA: Insufficient documentation

## 2015-01-17 DIAGNOSIS — M5410 Radiculopathy, site unspecified: Secondary | ICD-10-CM | POA: Diagnosis not present

## 2015-01-17 DIAGNOSIS — Z8719 Personal history of other diseases of the digestive system: Secondary | ICD-10-CM | POA: Diagnosis not present

## 2015-01-17 DIAGNOSIS — E119 Type 2 diabetes mellitus without complications: Secondary | ICD-10-CM | POA: Insufficient documentation

## 2015-01-17 DIAGNOSIS — D649 Anemia, unspecified: Secondary | ICD-10-CM | POA: Diagnosis not present

## 2015-01-17 DIAGNOSIS — M542 Cervicalgia: Secondary | ICD-10-CM | POA: Diagnosis not present

## 2015-01-17 DIAGNOSIS — Z7982 Long term (current) use of aspirin: Secondary | ICD-10-CM | POA: Insufficient documentation

## 2015-01-17 DIAGNOSIS — I480 Paroxysmal atrial fibrillation: Secondary | ICD-10-CM | POA: Diagnosis not present

## 2015-01-17 DIAGNOSIS — G8929 Other chronic pain: Secondary | ICD-10-CM | POA: Insufficient documentation

## 2015-01-17 DIAGNOSIS — M546 Pain in thoracic spine: Secondary | ICD-10-CM | POA: Diagnosis not present

## 2015-01-17 DIAGNOSIS — I2510 Atherosclerotic heart disease of native coronary artery without angina pectoris: Secondary | ICD-10-CM | POA: Insufficient documentation

## 2015-01-17 DIAGNOSIS — M549 Dorsalgia, unspecified: Secondary | ICD-10-CM

## 2015-01-17 LAB — BASIC METABOLIC PANEL
Anion gap: 11 (ref 5–15)
BUN: 13 mg/dL (ref 6–20)
CALCIUM: 9.9 mg/dL (ref 8.9–10.3)
CO2: 26 mmol/L (ref 22–32)
CREATININE: 0.98 mg/dL (ref 0.61–1.24)
Chloride: 101 mmol/L (ref 101–111)
GLUCOSE: 105 mg/dL — AB (ref 65–99)
Potassium: 4.2 mmol/L (ref 3.5–5.1)
Sodium: 138 mmol/L (ref 135–145)

## 2015-01-17 LAB — CBC WITH DIFFERENTIAL/PLATELET
BASOS PCT: 1 %
Basophils Absolute: 0.1 10*3/uL (ref 0.0–0.1)
EOS PCT: 2 %
Eosinophils Absolute: 0.1 10*3/uL (ref 0.0–0.7)
HEMATOCRIT: 41.2 % (ref 39.0–52.0)
Hemoglobin: 13.9 g/dL (ref 13.0–17.0)
Lymphocytes Relative: 25 %
Lymphs Abs: 1.5 10*3/uL (ref 0.7–4.0)
MCH: 29.9 pg (ref 26.0–34.0)
MCHC: 33.7 g/dL (ref 30.0–36.0)
MCV: 88.6 fL (ref 78.0–100.0)
MONO ABS: 0.4 10*3/uL (ref 0.1–1.0)
MONOS PCT: 7 %
NEUTROS ABS: 3.9 10*3/uL (ref 1.7–7.7)
Neutrophils Relative %: 65 %
PLATELETS: 279 10*3/uL (ref 150–400)
RBC: 4.65 MIL/uL (ref 4.22–5.81)
RDW: 12.6 % (ref 11.5–15.5)
WBC: 5.9 10*3/uL (ref 4.0–10.5)

## 2015-01-17 LAB — PROTIME-INR
INR: 1.69 — ABNORMAL HIGH (ref 0.00–1.49)
Prothrombin Time: 19.9 seconds — ABNORMAL HIGH (ref 11.6–15.2)

## 2015-01-17 LAB — CBG MONITORING, ED: Glucose-Capillary: 98 mg/dL (ref 65–99)

## 2015-01-17 MED ORDER — OXYCODONE-ACETAMINOPHEN 5-325 MG PO TABS: 1.0000 | ORAL_TABLET | Freq: Four times a day (QID) | ORAL | Status: DC | PRN

## 2015-01-17 MED ORDER — MORPHINE SULFATE (PF) 4 MG/ML IV SOLN
4.0000 mg | Freq: Once | INTRAVENOUS | Status: AC
  Administered 2015-01-17: 4 mg via INTRAVENOUS
  Filled 2015-01-17: qty 1

## 2015-01-17 NOTE — ED Notes (Signed)
Pt still in MRI 

## 2015-01-17 NOTE — ED Notes (Signed)
Patient transported to MRI 

## 2015-01-17 NOTE — ED Notes (Signed)
Pt presents via POV c/o worsening lower back pain and neck pain x 3 weeks.  Pt has known ruptured disc in neck but back pain is new.  Denies loss of bowel or bladder control, denies weakness or tingling on one side.  Pt states when he walks his legs start to buckle, uses a walker at home.  Pt a x 4, NAD.  MD at bedside

## 2015-01-17 NOTE — Discharge Instructions (Signed)
Take tylenol for pain.  Take percocet for severe pain. Do not drive with it.   See Dr. Cyndy Freeze in the office this week.  Return to ER if you have worse pain, numbness, trouble walking, dropping things with your hand.

## 2015-01-17 NOTE — ED Provider Notes (Signed)
CSN: [id number]     Arrival date & time 01/17/15  1004 History   First MD Initiated Contact with Patient 01/17/15 1013     Chief Complaint  Patient presents with  . Neck Pain  . Back Pain     (Consider location/radiation/quality/duration/timing/severity/associated sxs/prior Treatment) The history is provided by the patient.  [name] is a 74 y.o. male hx of afib on coumadin, cervical disc disease, diastolic heart failure here presenting with neck pain, back pain. States that he has chronic neck and back pain over the last 3 weeks has gotten worse. States that over the last week or so he has been having weakness in his arms. He usually walks with a walker at home but states that he's been weaker on both legs. States that his leg sometimes gave out under him. Denies any trouble urinating or loss of bowel control. He was seen in the ER several months ago and was diagnosed with cervical radiculopathy based on the CT cervical spine. He has no previous MRI in our system and has never seen a neurosurgeon.    Past Medical History  Diagnosis Date  . Atrial flutter ([place])     a. 07/2010 Status post caval tricuspid isthmus ablation by Dr. Midge Aver Metoprolol daily  . PAF (paroxysmal atrial fibrillation) ([place])     a. Recurrent after atrial flutter, currently controlled on flecainide plus diltiazem  . Hyperlipidemia     takes Pravastatin daily  . Diastolic CHF, chronic ([place])     a. 12/2012 EF 55-60%, diast dysfxn, triv MR, mildly dil LA/RA.  [name] Coronary artery disease, non-occlusive     a. 03/2010 Nonocclusive disease by cath, performed for ST elevations on ECG;  b. 06/2013 Lexi MV: EF 60%, no ischemia.  . Cervical radiculopathy due to degenerative joint disease of spine   . Claudication [facility])     a. lower ext dopplers 08/18/11-normal ABIs bilaterally with normal triphasic waveforms  . HTN (hypertension)     takes Diltiazem daily  . Dysrhythmia     HX OF ATRIAL IFB /FLUTTER takes Flecanide  and Coumadin daily  . History of bronchitis     1998  . Weakness     numbness and tingling both hands  . Joint pain   . History of gastric ulcer   . Anemia     takes Ferrous Sulfate daily  . History of blood transfusion 1982    "when I had stomach OR"  . Balance problem 01/2014  . Diabetes mellitus type II     takes Metformin and Lantus daily  . Pneumonia 1999  . CAP (community acquired pneumonia) 09/18/2014  . Arthritis     "all over"  . COPD (chronic obstructive pulmonary disease) [facility])    Past Surgical History  Procedure Laterality Date  . Atrial ablation surgery  08/05/10    CTI ablation for atrial flutter by JA  . Cardioversion  12/07/2010     Successful direct current cardioversion with atrial fibrillation to normal sinus rhythm  . Partial gastrectomy  1982    subtotal; "took out 30% for ulcers"  . Nm myoview ltd  07/22/2013    Normal EF ~60%, no ischemia or infarction.  . Colonoscopy [name] 12/02/2013    Procedure: COLONOSCOPY;  Surgeon: Irene Shipper, MD;  Location: [place];  Service: Endoscopy;  Laterality: [name];  . Cardiac catheterization  2012/2013/2015    nl LV function, no occlusive CAD, PAF  . Knee bursectomy Left 1998  . Incision and  drainage abscess / hematoma of bursa / knee / thigh Left 1998    knee  . Cataract extraction w/ intraocular lens  implant, bilateral Bilateral   . Yag laser application Bilateral   . Carpal tunnel release Bilateral 01/30/2014    Procedure: BILATERAL CARPAL TUNNEL RELEASE;  Surgeon: Marianna Payment, MD;  Location: [place];  Service: Orthopedics;  Laterality: Bilateral;  . Transthoracic echocardiogram  02/16/2014    EF 60%, no RWMA. - otherwise normal  . Laparoscopic cholecystectomy  03/2010  . Esophagogastroduodenoscopy [name] 09/22/2014    Procedure: ESOPHAGOGASTRODUODENOSCOPY (EGD);  Surgeon: Ronald Lobo, MD;  Location: [facility] ENDOSCOPY;  Service: Endoscopy;  Laterality: [name];   Family History  Problem Relation Age of Onset  . Cancer  Mother   . Heart attack Father   . Heart attack Brother    Social History  Substance Use Topics  . Smoking status: Current Every Day Smoker -- 0.50 packs/day for 55 years    Types: Cigarettes  . Smokeless tobacco: Never Used     Comment: He's been smoking between 0.5 and 2 ppd since age 21.  [name] Alcohol Use: 0.0 oz/week    0 Standard drinks or equivalent per week     Comment: "quit drinking in 1986"    Review of Systems  Musculoskeletal: Positive for back pain and neck pain.  All other systems reviewed and are negative.     Allergies  Review of patient's allergies indicates no known allergies.  Home Medications   Prior to Admission medications   Medication Sig Start Date End Date Taking? Authorizing Provider  acetaminophen (TYLENOL) 500 MG tablet Take 500 mg by mouth 2 (two) times daily.   Yes Historical Provider, MD  aspirin EC 81 MG tablet Take 81 mg by mouth daily.   Yes Historical Provider, MD  diltiazem (CARDIZEM CD) 120 MG 24 hr capsule Take 1 capsule (120 mg total) by mouth daily. 04/03/14  Yes Erlene Quan, PA-C  ferrous sulfate 325 (65 FE) MG tablet Take 325 mg by mouth daily with breakfast.   Yes Historical Provider, MD  flecainide (TAMBOCOR) 150 MG tablet Take 1.5 tablets (225 mg total) by mouth 2 (two) times daily. 12/01/14  Yes Leonie Man, MD  hydrocortisone cream 1 % Apply 1 application topically 2 (two) times daily as needed for itching (rash on arms and legs).    Yes Historical Provider, MD  LANTUS SOLOSTAR 100 UNIT/ML Solostar Pen Inject 4 Units as directed every 6 (six) hours.  08/31/14  Yes Historical Provider, MD  loratadine (CLARITIN) 10 MG tablet Take 10 mg by mouth 2 (two) times daily.    Yes Historical Provider, MD  metFORMIN (GLUCOPHAGE) 1000 MG tablet Take 1,000 mg by mouth 2 (two) times daily with a meal.    Yes Historical Provider, MD  metoprolol succinate (TOPROL XL) 25 MG 24 hr tablet Take 0.5 tablets (12.5 mg total) by mouth daily. Patient taking  differently: Take 12.5 mg by mouth at bedtime.  05/22/14  Yes Leonie Man, MD  Multiple Vitamin (MULTIVITAMIN WITH MINERALS) TABS tablet Take 1 tablet by mouth daily.   Yes Historical Provider, MD  nitroGLYCERIN (NITROSTAT) 0.4 MG SL tablet Place 1 tablet (0.4 mg total) under the tongue every 5 (five) minutes x 3 doses as needed for chest pain. 05/22/14  Yes Leonie Man, MD  pantoprazole (PROTONIX) 40 MG tablet Take 1 tablet (40 mg total) by mouth daily. Patient taking differently: Take 40 mg by mouth every  evening.  09/24/14  Yes Thurnell Lose, MD  pravastatin (PRAVACHOL) 40 MG tablet TAKE 1 TABLET EVERY EVENING 10/26/14  Yes Leonie Man, MD  warfarin (COUMADIN) 5 MG tablet Take 1.5 to 2 tablets by mouth daily as directed by coumadin clinic Patient taking differently: Take 5-7.5 mg by mouth daily at 6 PM. Take 5 mg by mouth daily Mon, Wed, and Fri. Take 7.5 mg by mouth daily on all other days. 10/27/14  Yes Troy Sine, MD  albuterol (PROVENTIL HFA;VENTOLIN HFA) 108 (90 BASE) MCG/ACT inhaler Inhale 2 puffs into the lungs every 4 (four) hours as needed for wheezing or shortness of breath. 09/14/14   Sherwood Gambler, MD  diazepam (VALIUM) 5 MG tablet Take 1 tablet (5 mg total) by mouth every 6 (six) hours as needed for muscle spasms. Patient not taking: Reported on 12/19/2014 09/29/14   Daleen Bo, MD  doxycycline (VIBRAMYCIN) 100 MG capsule Take 1 capsule (100 mg total) by mouth 2 (two) times daily. One po bid x 7 days 12/19/14   Carman Ching, PA-C  HYDROcodone-acetaminophen (NORCO/VICODIN) 5-325 MG per tablet Take 1 tablet by mouth every 6 (six) hours as needed for moderate pain. Patient not taking: Reported on 12/19/2014 10/09/14   Dorie Rank, MD  polyethylene glycol powder [facility]) powder 6 capfuls tomorrow morning. Then 3 capfuls three times per day for 3 days. Then 1 capful three times per day for 3 days. Then 1 capful per day for 7 days. Patient taking differently: Take 0.5 Containers by  mouth daily as needed for mild constipation or moderate constipation.  08/27/14   Addison Lank, MD  sucralfate (CARAFATE) 1 GM/10ML suspension Take 10 mLs (1 g total) by mouth 4 (four) times daily -  with meals and at bedtime. Patient not taking: Reported on 12/19/2014 09/24/14   Thurnell Lose, MD   BP 114/54 mmHg  Pulse 78  Temp(Src) 97.4 F (36.3 C) (Oral)  Resp 18  SpO2 94% Physical Exam  Constitutional: He is oriented to person, place, and time.  Uncomfortable, chronically ill   HENT:  Head: Normocephalic.  Mouth/Throat: Oropharynx is clear and moist.  Eyes: Conjunctivae are normal. Pupils are equal, round, and reactive to light.  Neck: Normal range of motion. Neck supple.  Lipoma R side of neck, nontender   Cardiovascular: Normal rate, regular rhythm and normal heart sounds.   Pulmonary/Chest: Effort normal and breath sounds normal. No respiratory distress. He has no wheezes. He has no rales.  Abdominal: Soft. Bowel sounds are normal. He exhibits no distension. There is no tenderness. There is no rebound.  Musculoskeletal:  Mild thoracic and lumbar tenderness, no obvious stepoff or deformity.   Neurological: He is alert and oriented to person, place, and time.  Strength 4/5 bilateral hand grasp and biceps flexion and triceps extension. Strength 5/5 bilateral lower extremities. No obvious saddle anesthesia   Skin: Skin is warm and dry.  Psychiatric: He has a normal mood and affect. His behavior is normal. Judgment and thought content normal.  Nursing note and vitals reviewed.   ED Course  Procedures (including critical care time) Labs Review Labs Reviewed  BASIC METABOLIC PANEL - Abnormal; Notable for the following:    Glucose, Bld 105 (*)    All other components within normal limits  PROTIME-INR - Abnormal; Notable for the following:    Prothrombin Time 19.9 (*)    INR 1.69 (*)    All other components within normal limits  CBC WITH DIFFERENTIAL/PLATELET  CBG  MONITORING, ED    Imaging Review Mr Cervical Spine Wo Contrast  01/17/2015  CLINICAL DATA:  Progressive neck and low back pain. Known cervical disc disease. Weakness in both legs. EXAM: MRI CERVICAL, THORACIC AND LUMBAR SPINE WITHOUT CONTRAST TECHNIQUE: Multiplanar and multiecho pulse sequences of the cervical spine, to include the craniocervical junction and cervicothoracic junction, and thoracic and lumbar spine, were obtained without intravenous contrast. COMPARISON:  CT of the cervical spine 10/09/2014. FINDINGS: MRI CERVICAL SPINE FINDINGS Normal signal is present in the cervical and upper thoracic spinal cord. Mild endplate marrow changes are evident at C4-5, C5-6, and C6-7. Vertebral body heights are maintained. Alignment is anatomic. There straightening of the normal cervical lordosis. Craniocervical junction is within normal limits. The visualized intracranial contents are normal. C2-3: A rightward disc osteophyte complex and asymmetric right-sided facet hypertrophy results in severe right foraminal narrowing. There is moderate right central canal stenosis. C3-4: A broad-based disc osteophyte complex is present. Moderate facet hypertrophy is worse on the left. This results in severe left and moderate right foraminal narrowing. There is effacement of the ventral CSF. C4-5: A broad-based disc osteophyte complex is present. Uncovertebral spurring and facet hypertrophy results in moderate right and mild left foraminal narrowing. C5-6: A broad-based disc osteophyte complex effaces ventral CSF and contacts the ventral surface of the cord. Moderate central canal stenosis is present. Moderate foraminal narrowing is worse on the left. C6-7: A broad-based disc osteophyte complex is asymmetric to the right. There is partial effacement of ventral CSF. Uncovertebral spurring results in severe right and moderate left foraminal stenosis. C7-T1: A broad-based disc osteophyte complex is present. Uncovertebral spurring  results in moderate foraminal narrowing, left greater than right. MRI THORACIC SPINE FINDINGS Normal signal is present throughout the thoracic spinal cord. A hemangioma is seen posteriorly on the right at Celsius T6. Marrow signal, vertebral body heights, alignment are otherwise normal. The disc levels at T7-8 above are normal Facet hypertrophy contributes to foraminal narrowing at at T8-9, right greater than left. Left foraminal narrowing is present T9-10 due to facet hypertrophy. The lower thoracic spine is unremarkable. The paraspinous soft tissues are within normal limits. MRI LUMBAR SPINE FINDINGS Normal signal is present in the conus medullaris which terminates at L1-2, within normal limits. There is diffuse fatty infiltration of the marrow. Vertebral body heights alignment are maintained. Limited imaging the abdomen is unremarkable. No significant adenopathy is present. L1-2: Asymmetric right-sided facet hypertrophy is present. There is no significant disc protrusion or stenosis. L2-3: A broad-based disc protrusion is asymmetric to the right. Moderate facet hypertrophy is noted. This results in moderate right and mild left subarticular narrowing. Mild foraminal narrowing is present bilaterally. L3-4: A mild broad-based disc bulge is present. Facet hypertrophy contributes to mild right foraminal stenosis. L4-5: A broad-based leftward disc protrusion is present. Mild subarticular narrowing is worse on the left. Moderate facet hypertrophy contributes to mild left foraminal narrowing. L5-S1: Advanced facet hypertrophy is worse on the right. There is no significant disc protrusion. Mild right foraminal stenosis is noted. IMPRESSION: 1. Multilevel moderate to severe spondylosis of the cervical spine as described. 2. Severe right foraminal and moderate right central canal stenosis at C2-3. 3. Severe left and moderate right foraminal narrowing at C3-4. 4. Moderate right and mild left foraminal narrowing at C4-5. 5.  Moderate central and bilateral foraminal narrowing at C5-6, worse on the left. 6. Severe right and moderate left foraminal narrowing at C6-7. 7. Moderate foraminal narrowing at C7-T1 is worse  on the left. 8. Foraminal narrowing at T8-9 and T9-10 due to facet hypertrophy. 9. The thoracic spine is otherwise within normal limits. 10. Moderate right and mild left subarticular narrowing at L2-3 secondary to a rightward disc protrusion and advanced facet hypertrophy. 11. Moderate to severe facet hypertrophy throughout the lumbar spine with relatively short pedicles. 12. Mild right foraminal narrowing at L3-4. 13. Mild subarticular narrowing at L4-5 is worse on the left. There is also mild left foraminal narrowing. 14. Mild right foraminal narrowing at L5-S1 secondary to facet hypertrophy. Electronically Signed   By: San Morelle M.D.   On: 01/17/2015 13:48   Mr Thoracic Spine Wo Contrast  01/17/2015  CLINICAL DATA:  Progressive neck and low back pain. Known cervical disc disease. Weakness in both legs. EXAM: MRI CERVICAL, THORACIC AND LUMBAR SPINE WITHOUT CONTRAST TECHNIQUE: Multiplanar and multiecho pulse sequences of the cervical spine, to include the craniocervical junction and cervicothoracic junction, and thoracic and lumbar spine, were obtained without intravenous contrast. COMPARISON:  CT of the cervical spine 10/09/2014. FINDINGS: MRI CERVICAL SPINE FINDINGS Normal signal is present in the cervical and upper thoracic spinal cord. Mild endplate marrow changes are evident at C4-5, C5-6, and C6-7. Vertebral body heights are maintained. Alignment is anatomic. There straightening of the normal cervical lordosis. Craniocervical junction is within normal limits. The visualized intracranial contents are normal. C2-3: A rightward disc osteophyte complex and asymmetric right-sided facet hypertrophy results in severe right foraminal narrowing. There is moderate right central canal stenosis. C3-4: A broad-based  disc osteophyte complex is present. Moderate facet hypertrophy is worse on the left. This results in severe left and moderate right foraminal narrowing. There is effacement of the ventral CSF. C4-5: A broad-based disc osteophyte complex is present. Uncovertebral spurring and facet hypertrophy results in moderate right and mild left foraminal narrowing. C5-6: A broad-based disc osteophyte complex effaces ventral CSF and contacts the ventral surface of the cord. Moderate central canal stenosis is present. Moderate foraminal narrowing is worse on the left. C6-7: A broad-based disc osteophyte complex is asymmetric to the right. There is partial effacement of ventral CSF. Uncovertebral spurring results in severe right and moderate left foraminal stenosis. C7-T1: A broad-based disc osteophyte complex is present. Uncovertebral spurring results in moderate foraminal narrowing, left greater than right. MRI THORACIC SPINE FINDINGS Normal signal is present throughout the thoracic spinal cord. A hemangioma is seen posteriorly on the right at Celsius T6. Marrow signal, vertebral body heights, alignment are otherwise normal. The disc levels at T7-8 above are normal Facet hypertrophy contributes to foraminal narrowing at at T8-9, right greater than left. Left foraminal narrowing is present T9-10 due to facet hypertrophy. The lower thoracic spine is unremarkable. The paraspinous soft tissues are within normal limits. MRI LUMBAR SPINE FINDINGS Normal signal is present in the conus medullaris which terminates at L1-2, within normal limits. There is diffuse fatty infiltration of the marrow. Vertebral body heights alignment are maintained. Limited imaging the abdomen is unremarkable. No significant adenopathy is present. L1-2: Asymmetric right-sided facet hypertrophy is present. There is no significant disc protrusion or stenosis. L2-3: A broad-based disc protrusion is asymmetric to the right. Moderate facet hypertrophy is noted. This  results in moderate right and mild left subarticular narrowing. Mild foraminal narrowing is present bilaterally. L3-4: A mild broad-based disc bulge is present. Facet hypertrophy contributes to mild right foraminal stenosis. L4-5: A broad-based leftward disc protrusion is present. Mild subarticular narrowing is worse on the left. Moderate facet hypertrophy contributes to mild  left foraminal narrowing. L5-S1: Advanced facet hypertrophy is worse on the right. There is no significant disc protrusion. Mild right foraminal stenosis is noted. IMPRESSION: 1. Multilevel moderate to severe spondylosis of the cervical spine as described. 2. Severe right foraminal and moderate right central canal stenosis at C2-3. 3. Severe left and moderate right foraminal narrowing at C3-4. 4. Moderate right and mild left foraminal narrowing at C4-5. 5. Moderate central and bilateral foraminal narrowing at C5-6, worse on the left. 6. Severe right and moderate left foraminal narrowing at C6-7. 7. Moderate foraminal narrowing at C7-T1 is worse on the left. 8. Foraminal narrowing at T8-9 and T9-10 due to facet hypertrophy. 9. The thoracic spine is otherwise within normal limits. 10. Moderate right and mild left subarticular narrowing at L2-3 secondary to a rightward disc protrusion and advanced facet hypertrophy. 11. Moderate to severe facet hypertrophy throughout the lumbar spine with relatively short pedicles. 12. Mild right foraminal narrowing at L3-4. 13. Mild subarticular narrowing at L4-5 is worse on the left. There is also mild left foraminal narrowing. 14. Mild right foraminal narrowing at L5-S1 secondary to facet hypertrophy. Electronically Signed   By: San Morelle M.D.   On: 01/17/2015 13:48   Mr Lumbar Spine Wo Contrast  01/17/2015  CLINICAL DATA:  Progressive neck and low back pain. Known cervical disc disease. Weakness in both legs. EXAM: MRI CERVICAL, THORACIC AND LUMBAR SPINE WITHOUT CONTRAST TECHNIQUE: Multiplanar  and multiecho pulse sequences of the cervical spine, to include the craniocervical junction and cervicothoracic junction, and thoracic and lumbar spine, were obtained without intravenous contrast. COMPARISON:  CT of the cervical spine 10/09/2014. FINDINGS: MRI CERVICAL SPINE FINDINGS Normal signal is present in the cervical and upper thoracic spinal cord. Mild endplate marrow changes are evident at C4-5, C5-6, and C6-7. Vertebral body heights are maintained. Alignment is anatomic. There straightening of the normal cervical lordosis. Craniocervical junction is within normal limits. The visualized intracranial contents are normal. C2-3: A rightward disc osteophyte complex and asymmetric right-sided facet hypertrophy results in severe right foraminal narrowing. There is moderate right central canal stenosis. C3-4: A broad-based disc osteophyte complex is present. Moderate facet hypertrophy is worse on the left. This results in severe left and moderate right foraminal narrowing. There is effacement of the ventral CSF. C4-5: A broad-based disc osteophyte complex is present. Uncovertebral spurring and facet hypertrophy results in moderate right and mild left foraminal narrowing. C5-6: A broad-based disc osteophyte complex effaces ventral CSF and contacts the ventral surface of the cord. Moderate central canal stenosis is present. Moderate foraminal narrowing is worse on the left. C6-7: A broad-based disc osteophyte complex is asymmetric to the right. There is partial effacement of ventral CSF. Uncovertebral spurring results in severe right and moderate left foraminal stenosis. C7-T1: A broad-based disc osteophyte complex is present. Uncovertebral spurring results in moderate foraminal narrowing, left greater than right. MRI THORACIC SPINE FINDINGS Normal signal is present throughout the thoracic spinal cord. A hemangioma is seen posteriorly on the right at Celsius T6. Marrow signal, vertebral body heights, alignment are  otherwise normal. The disc levels at T7-8 above are normal Facet hypertrophy contributes to foraminal narrowing at at T8-9, right greater than left. Left foraminal narrowing is present T9-10 due to facet hypertrophy. The lower thoracic spine is unremarkable. The paraspinous soft tissues are within normal limits. MRI LUMBAR SPINE FINDINGS Normal signal is present in the conus medullaris which terminates at L1-2, within normal limits. There is diffuse fatty infiltration of the marrow. Vertebral  body heights alignment are maintained. Limited imaging the abdomen is unremarkable. No significant adenopathy is present. L1-2: Asymmetric right-sided facet hypertrophy is present. There is no significant disc protrusion or stenosis. L2-3: A broad-based disc protrusion is asymmetric to the right. Moderate facet hypertrophy is noted. This results in moderate right and mild left subarticular narrowing. Mild foraminal narrowing is present bilaterally. L3-4: A mild broad-based disc bulge is present. Facet hypertrophy contributes to mild right foraminal stenosis. L4-5: A broad-based leftward disc protrusion is present. Mild subarticular narrowing is worse on the left. Moderate facet hypertrophy contributes to mild left foraminal narrowing. L5-S1: Advanced facet hypertrophy is worse on the right. There is no significant disc protrusion. Mild right foraminal stenosis is noted. IMPRESSION: 1. Multilevel moderate to severe spondylosis of the cervical spine as described. 2. Severe right foraminal and moderate right central canal stenosis at C2-3. 3. Severe left and moderate right foraminal narrowing at C3-4. 4. Moderate right and mild left foraminal narrowing at C4-5. 5. Moderate central and bilateral foraminal narrowing at C5-6, worse on the left. 6. Severe right and moderate left foraminal narrowing at C6-7. 7. Moderate foraminal narrowing at C7-T1 is worse on the left. 8. Foraminal narrowing at T8-9 and T9-10 due to facet hypertrophy.  9. The thoracic spine is otherwise within normal limits. 10. Moderate right and mild left subarticular narrowing at L2-3 secondary to a rightward disc protrusion and advanced facet hypertrophy. 11. Moderate to severe facet hypertrophy throughout the lumbar spine with relatively short pedicles. 12. Mild right foraminal narrowing at L3-4. 13. Mild subarticular narrowing at L4-5 is worse on the left. There is also mild left foraminal narrowing. 14. Mild right foraminal narrowing at L5-S1 secondary to facet hypertrophy. Electronically Signed   By: San Morelle M.D.   On: 01/17/2015 13:48   I have personally reviewed and evaluated these images and lab results as part of my medical decision-making.   EKG Interpretation None      MDM   Final diagnoses:  Back pain   [name] is a 74 y.o. male here with back pain, neck pain. Consider central cord vs peripheral neuropathy. No trauma, no spinal surgeries. Will get MRI entire spine. I am not concerned for epidural abscess.   3:03 PM MRI showed multi level disease. Repeat exam showed nl grip strength and nl gait so the weakness likely from pain. Also had carpal tunnel surgery and had been weaker so the arm weakness can be from peripheral causes. Consulted Dr. Cyndy Freeze, who reviewed MRI. States that has chronic spinal issues. No acute neurosurgery intervention needed. Recommend pain meds. He is on vicodin so will switch to percocet. Will have him see Dr. Cyndy Freeze this week.    Wandra Arthurs, MD 01/17/15 [phone]

## 2015-02-05 ENCOUNTER — Ambulatory Visit (INDEPENDENT_AMBULATORY_CARE_PROVIDER_SITE_OTHER): Payer: Medicare Other | Admitting: Ophthalmology

## 2015-02-05 DIAGNOSIS — H31002 Unspecified chorioretinal scars, left eye: Secondary | ICD-10-CM | POA: Diagnosis not present

## 2015-02-05 DIAGNOSIS — H43813 Vitreous degeneration, bilateral: Secondary | ICD-10-CM

## 2015-02-05 DIAGNOSIS — E113293 Type 2 diabetes mellitus with mild nonproliferative diabetic retinopathy without macular edema, bilateral: Secondary | ICD-10-CM | POA: Diagnosis not present

## 2015-02-05 DIAGNOSIS — E11319 Type 2 diabetes mellitus with unspecified diabetic retinopathy without macular edema: Secondary | ICD-10-CM | POA: Diagnosis not present

## 2015-02-12 ENCOUNTER — Ambulatory Visit (INDEPENDENT_AMBULATORY_CARE_PROVIDER_SITE_OTHER): Payer: Medicare Other | Admitting: Pharmacist Clinician (PhC)/ Clinical Pharmacy Specialist

## 2015-02-12 DIAGNOSIS — Z7901 Long term (current) use of anticoagulants: Secondary | ICD-10-CM

## 2015-02-12 DIAGNOSIS — I480 Paroxysmal atrial fibrillation: Secondary | ICD-10-CM

## 2015-02-12 LAB — POCT INR: INR: 2

## 2015-03-10 ENCOUNTER — Encounter (HOSPITAL_COMMUNITY): Payer: Self-pay | Admitting: Emergency Medicine

## 2015-03-10 ENCOUNTER — Observation Stay (HOSPITAL_COMMUNITY)
Admission: EM | Admit: 2015-03-10 | Discharge: 2015-03-11 | Disposition: A | Discharge status: Home / Self Care | Payer: Medicare Other | Attending: Internal Medicine | Admitting: Internal Medicine

## 2015-03-10 ENCOUNTER — Emergency Department (HOSPITAL_COMMUNITY): Payer: Medicare Other

## 2015-03-10 DIAGNOSIS — I10 Essential (primary) hypertension: Secondary | ICD-10-CM | POA: Diagnosis not present

## 2015-03-10 DIAGNOSIS — I440 Atrioventricular block, first degree: Secondary | ICD-10-CM | POA: Insufficient documentation

## 2015-03-10 DIAGNOSIS — E039 Hypothyroidism, unspecified: Secondary | ICD-10-CM | POA: Insufficient documentation

## 2015-03-10 DIAGNOSIS — F17200 Nicotine dependence, unspecified, uncomplicated: Secondary | ICD-10-CM

## 2015-03-10 DIAGNOSIS — I2510 Atherosclerotic heart disease of native coronary artery without angina pectoris: Secondary | ICD-10-CM | POA: Diagnosis not present

## 2015-03-10 DIAGNOSIS — Z961 Presence of intraocular lens: Secondary | ICD-10-CM | POA: Diagnosis not present

## 2015-03-10 DIAGNOSIS — E119 Type 2 diabetes mellitus without complications: Secondary | ICD-10-CM | POA: Diagnosis not present

## 2015-03-10 DIAGNOSIS — M5134 Other intervertebral disc degeneration, thoracic region: Secondary | ICD-10-CM | POA: Insufficient documentation

## 2015-03-10 DIAGNOSIS — I739 Peripheral vascular disease, unspecified: Secondary | ICD-10-CM | POA: Diagnosis not present

## 2015-03-10 DIAGNOSIS — Z7901 Long term (current) use of anticoagulants: Secondary | ICD-10-CM | POA: Diagnosis not present

## 2015-03-10 DIAGNOSIS — R0602 Shortness of breath: Secondary | ICD-10-CM | POA: Diagnosis not present

## 2015-03-10 DIAGNOSIS — I5032 Chronic diastolic (congestive) heart failure: Secondary | ICD-10-CM | POA: Insufficient documentation

## 2015-03-10 DIAGNOSIS — Z9842 Cataract extraction status, left eye: Secondary | ICD-10-CM | POA: Diagnosis not present

## 2015-03-10 DIAGNOSIS — I4892 Unspecified atrial flutter: Secondary | ICD-10-CM | POA: Insufficient documentation

## 2015-03-10 DIAGNOSIS — R079 Chest pain, unspecified: Secondary | ICD-10-CM | POA: Diagnosis not present

## 2015-03-10 DIAGNOSIS — M542 Cervicalgia: Secondary | ICD-10-CM | POA: Insufficient documentation

## 2015-03-10 DIAGNOSIS — M549 Dorsalgia, unspecified: Secondary | ICD-10-CM | POA: Diagnosis not present

## 2015-03-10 DIAGNOSIS — F17210 Nicotine dependence, cigarettes, uncomplicated: Secondary | ICD-10-CM | POA: Insufficient documentation

## 2015-03-10 DIAGNOSIS — Z794 Long term (current) use of insulin: Secondary | ICD-10-CM | POA: Insufficient documentation

## 2015-03-10 DIAGNOSIS — E782 Mixed hyperlipidemia: Secondary | ICD-10-CM | POA: Diagnosis present

## 2015-03-10 DIAGNOSIS — E1159 Type 2 diabetes mellitus with other circulatory complications: Secondary | ICD-10-CM

## 2015-03-10 DIAGNOSIS — G8929 Other chronic pain: Secondary | ICD-10-CM | POA: Diagnosis not present

## 2015-03-10 DIAGNOSIS — I25118 Atherosclerotic heart disease of native coronary artery with other forms of angina pectoris: Secondary | ICD-10-CM | POA: Diagnosis present

## 2015-03-10 DIAGNOSIS — Z7982 Long term (current) use of aspirin: Secondary | ICD-10-CM | POA: Diagnosis not present

## 2015-03-10 DIAGNOSIS — Z8719 Personal history of other diseases of the digestive system: Secondary | ICD-10-CM | POA: Diagnosis not present

## 2015-03-10 DIAGNOSIS — E785 Hyperlipidemia, unspecified: Secondary | ICD-10-CM

## 2015-03-10 DIAGNOSIS — I480 Paroxysmal atrial fibrillation: Secondary | ICD-10-CM | POA: Diagnosis not present

## 2015-03-10 DIAGNOSIS — J449 Chronic obstructive pulmonary disease, unspecified: Secondary | ICD-10-CM | POA: Insufficient documentation

## 2015-03-10 DIAGNOSIS — I110 Hypertensive heart disease with heart failure: Secondary | ICD-10-CM | POA: Diagnosis not present

## 2015-03-10 DIAGNOSIS — D649 Anemia, unspecified: Secondary | ICD-10-CM

## 2015-03-10 DIAGNOSIS — Z9841 Cataract extraction status, right eye: Secondary | ICD-10-CM | POA: Insufficient documentation

## 2015-03-10 DIAGNOSIS — R531 Weakness: Secondary | ICD-10-CM | POA: Diagnosis not present

## 2015-03-10 DIAGNOSIS — M5412 Radiculopathy, cervical region: Secondary | ICD-10-CM | POA: Diagnosis not present

## 2015-03-10 LAB — CBC
HCT: 36 % — ABNORMAL LOW (ref 39.0–52.0)
Hemoglobin: 11.5 g/dL — ABNORMAL LOW (ref 13.0–17.0)
MCH: 29.9 pg (ref 26.0–34.0)
MCHC: 31.9 g/dL (ref 30.0–36.0)
MCV: 93.8 fL (ref 78.0–100.0)
PLATELETS: 225 10*3/uL (ref 150–400)
RBC: 3.84 MIL/uL — AB (ref 4.22–5.81)
RDW: 15 % (ref 11.5–15.5)
WBC: 6.6 10*3/uL (ref 4.0–10.5)

## 2015-03-10 LAB — PROTIME-INR
INR: 1.92 — ABNORMAL HIGH (ref 0.00–1.49)
Prothrombin Time: 21.9 seconds — ABNORMAL HIGH (ref 11.6–15.2)

## 2015-03-10 LAB — TSH: TSH: 2.444 u[IU]/mL (ref 0.350–4.500)

## 2015-03-10 LAB — BASIC METABOLIC PANEL
Anion gap: 10 (ref 5–15)
BUN: 11 mg/dL (ref 6–20)
CALCIUM: 9.4 mg/dL (ref 8.9–10.3)
CO2: 24 mmol/L (ref 22–32)
CREATININE: 1.17 mg/dL (ref 0.61–1.24)
Chloride: 103 mmol/L (ref 101–111)
GFR, EST NON AFRICAN AMERICAN: 59 mL/min — AB (ref >60)
Glucose, Bld: 166 mg/dL — ABNORMAL HIGH (ref 65–99)
Potassium: 3.7 mmol/L (ref 3.5–5.1)
Sodium: 137 mmol/L (ref 135–145)

## 2015-03-10 LAB — PHOSPHORUS: PHOSPHORUS: 4.9 mg/dL — AB (ref 2.5–4.6)

## 2015-03-10 LAB — GLUCOSE, CAPILLARY: GLUCOSE-CAPILLARY: 156 mg/dL — AB (ref 65–99)

## 2015-03-10 LAB — MAGNESIUM: Magnesium: 1.8 mg/dL (ref 1.7–2.4)

## 2015-03-10 LAB — I-STAT TROPONIN, ED: TROPONIN I, POC: 0 ng/mL (ref 0.00–0.08)

## 2015-03-10 LAB — VITAMIN B12: Vitamin B-12: 308 pg/mL (ref 180–914)

## 2015-03-10 LAB — TROPONIN I: Troponin I: <0.03 ng/mL (ref <0.031)

## 2015-03-10 MED ORDER — FLECAINIDE ACETATE 50 MG PO TABS
225.0000 mg | ORAL_TABLET | Freq: Two times a day (BID) | ORAL | Status: DC
  Administered 2015-03-11: 225 mg via ORAL
  Filled 2015-03-10 (×3): qty 1

## 2015-03-10 MED ORDER — OXYCODONE-ACETAMINOPHEN 5-325 MG PO TABS
1.0000 | ORAL_TABLET | Freq: Four times a day (QID) | ORAL | Status: DC | PRN
  Administered 2015-03-10 – 2015-03-11 (×3): 2 via ORAL
  Filled 2015-03-10 (×3): qty 2

## 2015-03-10 MED ORDER — INSULIN ASPART 100 UNIT/ML ~~LOC~~ SOLN
0.0000 [IU] | Freq: Three times a day (TID) | SUBCUTANEOUS | Status: DC
  Administered 2015-03-11: 1 [IU] via SUBCUTANEOUS

## 2015-03-10 MED ORDER — FLECAINIDE ACETATE 100 MG PO TABS: 225.0000 mg | ORAL_TABLET | Freq: Two times a day (BID) | ORAL | Status: DC

## 2015-03-10 MED ORDER — NITROGLYCERIN 0.4 MG SL SUBL: 0.4000 mg | SUBLINGUAL_TABLET | SUBLINGUAL | Status: DC | PRN

## 2015-03-10 MED ORDER — ADULT MULTIVITAMIN W/MINERALS CH
1.0000 | ORAL_TABLET | Freq: Every day | ORAL | Status: DC
  Administered 2015-03-11: 1 via ORAL
  Filled 2015-03-10: qty 1

## 2015-03-10 MED ORDER — PANTOPRAZOLE SODIUM 40 MG PO TBEC
40.0000 mg | DELAYED_RELEASE_TABLET | Freq: Every day | ORAL | Status: DC
Start: 2015-03-10 — End: 2015-03-11
  Administered 2015-03-11: 40 mg via ORAL
  Filled 2015-03-10: qty 1

## 2015-03-10 MED ORDER — ACETAMINOPHEN 325 MG PO TABS: 650.0000 mg | ORAL_TABLET | ORAL | Status: DC | PRN

## 2015-03-10 MED ORDER — LORATADINE 10 MG PO TABS
10.0000 mg | ORAL_TABLET | Freq: Two times a day (BID) | ORAL | Status: DC
  Administered 2015-03-11: 10 mg via ORAL
  Filled 2015-03-10: qty 1

## 2015-03-10 MED ORDER — ALBUTEROL SULFATE (2.5 MG/3ML) 0.083% IN NEBU: 2.5000 mg | INHALATION_SOLUTION | RESPIRATORY_TRACT | Status: DC | PRN

## 2015-03-10 MED ORDER — ALBUTEROL SULFATE HFA 108 (90 BASE) MCG/ACT IN AERS: 2.0000 | INHALATION_SPRAY | RESPIRATORY_TRACT | Status: DC | PRN

## 2015-03-10 MED ORDER — ENOXAPARIN SODIUM 40 MG/0.4ML ~~LOC~~ SOLN
40.0000 mg | SUBCUTANEOUS | Status: DC
  Administered 2015-03-10: 40 mg via SUBCUTANEOUS
  Filled 2015-03-10: qty 0.4

## 2015-03-10 MED ORDER — FLECAINIDE ACETATE 50 MG PO TABS: 225.0000 mg | ORAL_TABLET | Freq: Two times a day (BID) | ORAL | Status: DC

## 2015-03-10 MED ORDER — PRAVASTATIN SODIUM 40 MG PO TABS: 40.0000 mg | ORAL_TABLET | Freq: Every evening | ORAL | Status: DC

## 2015-03-10 MED ORDER — ONDANSETRON HCL 4 MG/2ML IJ SOLN: 4.0000 mg | Freq: Four times a day (QID) | INTRAMUSCULAR | Status: DC | PRN

## 2015-03-10 MED ORDER — MORPHINE SULFATE (PF) 2 MG/ML IV SOLN
2.0000 mg | INTRAVENOUS | Status: DC | PRN
  Administered 2015-03-11 (×2): 2 mg via INTRAVENOUS
  Filled 2015-03-10 (×2): qty 1

## 2015-03-10 MED ORDER — DILTIAZEM HCL ER COATED BEADS 120 MG PO CP24
120.0000 mg | ORAL_CAPSULE | Freq: Every day | ORAL | Status: DC
  Administered 2015-03-11: 120 mg via ORAL
  Filled 2015-03-10: qty 1

## 2015-03-10 MED ORDER — WARFARIN SODIUM 7.5 MG PO TABS
7.5000 mg | ORAL_TABLET | Freq: Once | ORAL | Status: DC
  Filled 2015-03-10: qty 1

## 2015-03-10 MED ORDER — WARFARIN - PHARMACIST DOSING INPATIENT
Freq: Every day | Status: DC
  Administered 2015-03-11: 18:00:00

## 2015-03-10 MED ORDER — FERROUS SULFATE 325 (65 FE) MG PO TABS
325.0000 mg | ORAL_TABLET | Freq: Every day | ORAL | Status: DC
  Administered 2015-03-11: 325 mg via ORAL
  Filled 2015-03-10: qty 1

## 2015-03-10 MED ORDER — METOPROLOL SUCCINATE ER 25 MG PO TB24: 12.5000 mg | ORAL_TABLET | Freq: Every day | ORAL | Status: DC

## 2015-03-10 MED ORDER — ASPIRIN 81 MG PO CHEW
324.0000 mg | CHEWABLE_TABLET | Freq: Once | ORAL | Status: AC
Start: 2015-03-10 — End: 2015-03-10
  Administered 2015-03-10: 324 mg via ORAL
  Filled 2015-03-10: qty 4

## 2015-03-10 MED ORDER — ASPIRIN EC 81 MG PO TBEC
81.0000 mg | DELAYED_RELEASE_TABLET | Freq: Every day | ORAL | Status: DC
  Administered 2015-03-11: 81 mg via ORAL
  Filled 2015-03-10: qty 1

## 2015-03-10 NOTE — ED Provider Notes (Signed)
CSN: [id number]     Arrival date & time 03/10/15  1425 History   First MD Initiated Contact with Patient 03/10/15 1612     Chief Complaint  Patient presents with  . Chest Pain    Patient is a 74 y.o. male presenting with chest pain. The history is provided by the patient.  Chest Pain Pain location:  L chest Pain quality: sharp   Pain radiates to:  Does not radiate Pain severity:  Moderate Onset quality:  Gradual Duration:  3 days Timing:  Intermittent Progression:  Worsening Chronicity:  New Relieved by:  Nothing Worsened by:  Nothing tried Associated symptoms: weakness   Associated symptoms: no diaphoresis, no shortness of breath, no syncope and not vomiting   Risk factors: coronary artery disease   Patient reports left sided CP for past 3 days It is intermittent, lasts for about 15 minutes at at time He reports weakness at times with CP He denies SOB but told nurse that he did have SOB No pleuritic CP is reported  Past Medical History  Diagnosis Date  . Atrial flutter ([place])     a. 07/2010 Status post caval tricuspid isthmus ablation by Dr. Midge Aver Metoprolol daily  . PAF (paroxysmal atrial fibrillation) ([place])     a. Recurrent after atrial flutter, currently controlled on flecainide plus diltiazem  . Hyperlipidemia     takes Pravastatin daily  . Diastolic CHF, chronic ([place])     a. 12/2012 EF 55-60%, diast dysfxn, triv MR, mildly dil LA/RA.  [name] Coronary artery disease, non-occlusive     a. 03/2010 Nonocclusive disease by cath, performed for ST elevations on ECG;  b. 06/2013 Lexi MV: EF 60%, no ischemia.  . Cervical radiculopathy due to degenerative joint disease of spine   . Claudication [facility])     a. lower ext dopplers 08/18/11-normal ABIs bilaterally with normal triphasic waveforms  . HTN (hypertension)     takes Diltiazem daily  . Dysrhythmia     HX OF ATRIAL IFB /FLUTTER takes Flecanide and Coumadin daily  . History of bronchitis     1998  . Weakness      numbness and tingling both hands  . Joint pain   . History of gastric ulcer   . Anemia     takes Ferrous Sulfate daily  . History of blood transfusion 1982    "when I had stomach OR"  . Balance problem 01/2014  . Diabetes mellitus type II     takes Metformin and Lantus daily  . Pneumonia 1999  . CAP (community acquired pneumonia) 09/18/2014  . Arthritis     "all over"  . COPD (chronic obstructive pulmonary disease) [facility])    Past Surgical History  Procedure Laterality Date  . Atrial ablation surgery  08/05/10    CTI ablation for atrial flutter by JA  . Cardioversion  12/07/2010     Successful direct current cardioversion with atrial fibrillation to normal sinus rhythm  . Partial gastrectomy  1982    subtotal; "took out 30% for ulcers"  . Nm myoview ltd  07/22/2013    Normal EF ~60%, no ischemia or infarction.  . Colonoscopy [name] 12/02/2013    Procedure: COLONOSCOPY;  Surgeon: Irene Shipper, MD;  Location: [place];  Service: Endoscopy;  Laterality: [name];  . Cardiac catheterization  2012/2013/2015    nl LV function, no occlusive CAD, PAF  . Knee bursectomy Left 1998  . Incision and drainage abscess / hematoma of bursa / knee / thigh  Left 1998    knee  . Cataract extraction w/ intraocular lens  implant, bilateral Bilateral   . Yag laser application Bilateral   . Carpal tunnel release Bilateral 01/30/2014    Procedure: BILATERAL CARPAL TUNNEL RELEASE;  Surgeon: Marianna Payment, MD;  Location: [place];  Service: Orthopedics;  Laterality: Bilateral;  . Transthoracic echocardiogram  02/16/2014    EF 60%, no RWMA. - otherwise normal  . Laparoscopic cholecystectomy  03/2010  . Esophagogastroduodenoscopy [name] 09/22/2014    Procedure: ESOPHAGOGASTRODUODENOSCOPY (EGD);  Surgeon: Ronald Lobo, MD;  Location: [facility] ENDOSCOPY;  Service: Endoscopy;  Laterality: [name];   Family History  Problem Relation Age of Onset  . Cancer Mother   . Heart attack Father   . Heart attack Brother    Social  History  Substance Use Topics  . Smoking status: Current Every Day Smoker -- 0.50 packs/day for 55 years    Types: Cigarettes  . Smokeless tobacco: Never Used     Comment: He's been smoking between 0.5 and 2 ppd since age 47.  [name] Alcohol Use: 0.0 oz/week    0 Standard drinks or equivalent per week     Comment: "quit drinking in 1986"    Review of Systems  Constitutional: Negative for diaphoresis.  Respiratory: Negative for shortness of breath.   Cardiovascular: Positive for chest pain. Negative for syncope.  Gastrointestinal: Negative for vomiting.  Neurological: Positive for weakness.  All other systems reviewed and are negative.     Allergies  Review of patient's allergies indicates no known allergies.  Home Medications   Prior to Admission medications   Medication Sig Start Date End Date Taking? Authorizing Provider  acetaminophen (TYLENOL) 500 MG tablet Take 500 mg by mouth 2 (two) times daily.    Historical Provider, MD  albuterol (PROVENTIL HFA;VENTOLIN HFA) 108 (90 BASE) MCG/ACT inhaler Inhale 2 puffs into the lungs every 4 (four) hours as needed for wheezing or shortness of breath. 09/14/14   Sherwood Gambler, MD  aspirin EC 81 MG tablet Take 81 mg by mouth daily.    Historical Provider, MD  diazepam (VALIUM) 5 MG tablet Take 1 tablet (5 mg total) by mouth every 6 (six) hours as needed for muscle spasms. Patient not taking: Reported on 12/19/2014 09/29/14   Daleen Bo, MD  diltiazem (CARDIZEM CD) 120 MG 24 hr capsule Take 1 capsule (120 mg total) by mouth daily. 04/03/14   Erlene Quan, PA-C  doxycycline (VIBRAMYCIN) 100 MG capsule Take 1 capsule (100 mg total) by mouth 2 (two) times daily. One po bid x 7 days 12/19/14   Carman Ching, PA-C  ferrous sulfate 325 (65 FE) MG tablet Take 325 mg by mouth daily with breakfast.    Historical Provider, MD  flecainide (TAMBOCOR) 150 MG tablet Take 1.5 tablets (225 mg total) by mouth 2 (two) times daily. 12/01/14   Leonie Man, MD   HYDROcodone-acetaminophen (NORCO/VICODIN) 5-325 MG per tablet Take 1 tablet by mouth every 6 (six) hours as needed for moderate pain. Patient not taking: Reported on 12/19/2014 10/09/14   Dorie Rank, MD  hydrocortisone cream 1 % Apply 1 application topically 2 (two) times daily as needed for itching (rash on arms and legs).     Historical Provider, MD  LANTUS SOLOSTAR 100 UNIT/ML Solostar Pen Inject 4 Units as directed every 6 (six) hours.  08/31/14   Historical Provider, MD  loratadine (CLARITIN) 10 MG tablet Take 10 mg by mouth 2 (two) times daily.  Historical Provider, MD  metFORMIN (GLUCOPHAGE) 1000 MG tablet Take 1,000 mg by mouth 2 (two) times daily with a meal.     Historical Provider, MD  metoprolol succinate (TOPROL XL) 25 MG 24 hr tablet Take 0.5 tablets (12.5 mg total) by mouth daily. Patient taking differently: Take 12.5 mg by mouth at bedtime.  05/22/14   Leonie Man, MD  Multiple Vitamin (MULTIVITAMIN WITH MINERALS) TABS tablet Take 1 tablet by mouth daily.    Historical Provider, MD  nitroGLYCERIN (NITROSTAT) 0.4 MG SL tablet Place 1 tablet (0.4 mg total) under the tongue every 5 (five) minutes x 3 doses as needed for chest pain. 05/22/14   Leonie Man, MD  oxyCODONE-acetaminophen (PERCOCET) 5-325 MG tablet Take 1-2 tablets by mouth every 6 (six) hours as needed. 01/17/15   Wandra Arthurs, MD  pantoprazole (PROTONIX) 40 MG tablet Take 1 tablet (40 mg total) by mouth daily. Patient taking differently: Take 40 mg by mouth every evening.  09/24/14   Thurnell Lose, MD  polyethylene glycol powder (MIRALAX) powder 6 capfuls tomorrow morning. Then 3 capfuls three times per day for 3 days. Then 1 capful three times per day for 3 days. Then 1 capful per day for 7 days. Patient taking differently: Take 0.5 Containers by mouth daily as needed for mild constipation or moderate constipation.  08/27/14   Addison Lank, MD  pravastatin (PRAVACHOL) 40 MG tablet TAKE 1 TABLET EVERY EVENING 10/26/14    Leonie Man, MD  sucralfate (CARAFATE) 1 GM/10ML suspension Take 10 mLs (1 g total) by mouth 4 (four) times daily -  with meals and at bedtime. Patient not taking: Reported on 12/19/2014 09/24/14   Thurnell Lose, MD  warfarin (COUMADIN) 5 MG tablet Take 1.5 to 2 tablets by mouth daily as directed by coumadin clinic Patient taking differently: Take 5-7.5 mg by mouth daily at 6 PM. Take 5 mg by mouth daily Mon, Wed, and Fri. Take 7.5 mg by mouth daily on all other days. 10/27/14   Troy Sine, MD   BP 131/72 mmHg  Pulse 91  Temp(Src) 97.5 F (36.4 C) (Oral)  Resp 18  SpO2 100% Physical Exam CONSTITUTIONAL: Well developed/well nourished HEAD: Normocephalic/atraumatic EYES: EOMI ENMT: Mucous membranes moist NECK: supple no meningeal signs SPINE/BACK:entire spine nontender CV: S1/S2 noted, no murmurs/rubs/gallops noted LUNGS: Lungs are clear to auscultation bilaterally, no apparent distress ABDOMEN: soft, nontender, no rebound or guarding, bowel sounds noted throughout abdomen GU:no cva tenderness NEURO: Pt is awake/alert/appropriate, moves all extremitiesx4.   EXTREMITIES: pulses normal/equal, full ROM SKIN: warm, color normal, no chest wall tenderness, no rash to chest PSYCH: no abnormalities of mood noted, alert and oriented to situation  ED Course  Procedures  Medications  aspirin chewable tablet 324 mg (324 mg Oral Given 03/10/15 1636)    4:50 PM Pt with intermittent episodes of CP, he feels it is worsening He is CP free Pt is high risk as he has h/o CAD Will benefit from admission I doubt PE/Dissection at this time 5:02 PM D/w dr Ree Kida She requests OBS admit for triad  Labs Review Labs Reviewed  BASIC METABOLIC PANEL - Abnormal; Notable for the following:    Glucose, Bld 166 (*)    GFR calc non Af Amer 59 (*)    All other components within normal limits  CBC - Abnormal; Notable for the following:    RBC 3.84 (*)    Hemoglobin 11.5 (*)    HCT 36.0 (*)  All other components within normal limits  PROTIME-INR - Abnormal; Notable for the following:    Prothrombin Time 21.9 (*)    INR 1.92 (*)    All other components within normal limits  I-STAT TROPOININ, ED    Imaging Review Dg Chest 2 View  03/10/2015  CLINICAL DATA:  Chest pain and shortness of breath for 3 days. EXAM: CHEST  2 VIEW COMPARISON:  December 19, 2014. FINDINGS: The heart size and mediastinal contours are within normal limits. Both lungs are clear. No pneumothorax or pleural effusion is noted. Multilevel degenerative disc disease is noted in the thoracic spine. IMPRESSION: No active cardiopulmonary disease. Electronically Signed   By: Marijo Conception, M.D.   On: 03/10/2015 15:12   I have personally reviewed and evaluated these images and lab results as part of my medical decision-making.   EKG Interpretation   Date/Time:  Wednesday March 10 2015 14:33:38 EST Ventricular Rate:  79 PR Interval:  280 QRS Duration: 104 QT Interval:  406 QTC Calculation: 465 R Axis:   -28 Text Interpretation:  Sinus rhythm with 1st degree A-V block Otherwise  normal ECG Confirmed by Hazle Coca [phone]) on 03/10/2015 2:41:36 PM      MDM   Final diagnoses:  Chest pain, rule out acute myocardial infarction    Nursing notes including past medical history and social history reviewed and considered in documentation xrays/imaging reviewed by myself and considered during evaluation Labs/vital reviewed myself and considered during evaluation Previous records reviewed and considered     Ripley Fraise, MD 03/10/15 1705

## 2015-03-10 NOTE — H&P (Addendum)
[facility] History and Physical  [name] [id number] DOB: 04-14-1940 DOA: 03/10/2015  Referring physician: Dr. Ripley Fraise, EDP PCP: [name], MD  Specialists: Dr. [name], cardiology  Chief Complaint: Chest pain and weakness  HPI: [name] is a 74 y.o. male  With a history of nonobstructive coronary artery disease, atrial fibrillation, hypertension, diabetes mellitus that presented to the emergency department with complaints of chest pain and weakness. Chest pain as been ongoing for 3 days. He states it is located on his left side with no radiation of symptoms. He denied any shortness of breath, nausea, dizziness, diaphoresis with this pain. He states it is sharp in nature and lasts a few minutes. Currently, patient is not having any chest pain. He denies any recent ill contacts or travel. Patient also complains of weakness especially in his upper extremities. He does state he had an MRI and has seen a neurosurgeon who "just gives medications." Patient currently denies any nausea, vomiting, shortness of breath, abdominal pain, dizziness or headache. Patient does see Dr. [name], cardiologist, for his atrial fibrillation. In the emergency department, patient was found to have no changes in his EKG, troponin was negative. Given his risk factors, [facility] was called for admission.  Review of Systems:  Constitutional: Denies fever, chills, diaphoresis, appetite change and fatigue.  HEENT: Denies photophobia, eye pain, redness, hearing loss, ear pain, congestion, sore throat, rhinorrhea, sneezing, mouth sores, trouble swallowing, neck pain, neck stiffness and tinnitus.   Respiratory: Complains of shortness of breath occasionally. Cardiovascular: Complains of chest pain.  Gastrointestinal: Denies nausea, vomiting, abdominal pain, diarrhea, constipation, blood in stool and abdominal distention.  Genitourinary: Denies dysuria, urgency, frequency, hematuria, flank pain and  difficulty urinating.  Musculoskeletal: Complains of back pain and neck pain. Skin: Denies pallor, rash and wound.  Neurological: Planes of weakness in his upper extremities. Hematological: Denies adenopathy. Easy bruising, personal or family bleeding history  Psychiatric/Behavioral: Denies suicidal ideation, mood changes, confusion, nervousness, sleep disturbance and agitation  Past Medical History  Diagnosis Date  . Atrial flutter ([place])     a. 07/2010 Status post caval tricuspid isthmus ablation by Dr. Midge Aver Metoprolol daily  . PAF (paroxysmal atrial fibrillation) ([place])     a. Recurrent after atrial flutter, currently controlled on flecainide plus diltiazem  . Hyperlipidemia     takes Pravastatin daily  . Diastolic CHF, chronic ([place])     a. 12/2012 EF 55-60%, diast dysfxn, triv MR, mildly dil LA/RA.  [name] Coronary artery disease, non-occlusive     a. 03/2010 Nonocclusive disease by cath, performed for ST elevations on ECG;  b. 06/2013 Lexi MV: EF 60%, no ischemia.  . Cervical radiculopathy due to degenerative joint disease of spine   . Claudication [facility])     a. lower ext dopplers 08/18/11-normal ABIs bilaterally with normal triphasic waveforms  . HTN (hypertension)     takes Diltiazem daily  . Dysrhythmia     HX OF ATRIAL IFB /FLUTTER takes Flecanide and Coumadin daily  . History of bronchitis     1998  . Weakness     numbness and tingling both hands  . Joint pain   . History of gastric ulcer   . Anemia     takes Ferrous Sulfate daily  . History of blood transfusion 1982    "when I had stomach OR"  . Balance problem 01/2014  . Diabetes mellitus type II     takes Metformin and Lantus daily  . Pneumonia 1999  . CAP (community acquired  pneumonia) 09/18/2014  . Arthritis     "all over"  . COPD (chronic obstructive pulmonary disease) [facility])    Past Surgical History  Procedure Laterality Date  . Atrial ablation surgery  08/05/10    CTI ablation for atrial flutter by JA  .  Cardioversion  12/07/2010     Successful direct current cardioversion with atrial fibrillation to normal sinus rhythm  . Partial gastrectomy  1982    subtotal; "took out 30% for ulcers"  . Nm myoview ltd  07/22/2013    Normal EF ~60%, no ischemia or infarction.  . Colonoscopy [name] 12/02/2013    Procedure: COLONOSCOPY;  Surgeon: Irene Shipper, MD;  Location: [place];  Service: Endoscopy;  Laterality: [name];  . Cardiac catheterization  2012/2013/2015    nl LV function, no occlusive CAD, PAF  . Knee bursectomy Left 1998  . Incision and drainage abscess / hematoma of bursa / knee / thigh Left 1998    knee  . Cataract extraction w/ intraocular lens  implant, bilateral Bilateral   . Yag laser application Bilateral   . Carpal tunnel release Bilateral 01/30/2014    Procedure: BILATERAL CARPAL TUNNEL RELEASE;  Surgeon: Marianna Payment, MD;  Location: [place];  Service: Orthopedics;  Laterality: Bilateral;  . Transthoracic echocardiogram  02/16/2014    EF 60%, no RWMA. - otherwise normal  . Laparoscopic cholecystectomy  03/2010  . Esophagogastroduodenoscopy [name] 09/22/2014    Procedure: ESOPHAGOGASTRODUODENOSCOPY (EGD);  Surgeon: Ronald Lobo, MD;  Location: [facility] ENDOSCOPY;  Service: Endoscopy;  Laterality: [name];   Social History:  reports that he has been smoking Cigarettes.  He has a 27.5 pack-year smoking history. He has never used smokeless tobacco. He reports that he drinks alcohol. He reports that he does not use illicit drugs.   No Known Allergies  Family History  Problem Relation Age of Onset  . Cancer Mother   . Heart attack Father   . Heart attack Brother     Prior to Admission medications   Medication Sig Start Date End Date Taking? Authorizing Provider  acetaminophen (TYLENOL) 500 MG tablet Take 500 mg by mouth 2 (two) times daily.   Yes Historical Provider, MD  albuterol (PROVENTIL HFA;VENTOLIN HFA) 108 (90 BASE) MCG/ACT inhaler Inhale 2 puffs into the lungs every 4 (four) hours as  needed for wheezing or shortness of breath. 09/14/14  Yes Sherwood Gambler, MD  aspirin EC 81 MG tablet Take 81 mg by mouth daily.   Yes Historical Provider, MD  diltiazem (CARDIZEM CD) 120 MG 24 hr capsule Take 1 capsule (120 mg total) by mouth daily. 04/03/14  Yes Erlene Quan, PA-C  ferrous sulfate 325 (65 FE) MG tablet Take 325 mg by mouth daily with breakfast.   Yes Historical Provider, MD  flecainide (TAMBOCOR) 150 MG tablet Take 1.5 tablets (225 mg total) by mouth 2 (two) times daily. 12/01/14  Yes Leonie Man, MD  HYDROcodone-acetaminophen (NORCO/VICODIN) 5-325 MG per tablet Take 1 tablet by mouth every 6 (six) hours as needed for moderate pain. Patient taking differently: Take 0.5-1 tablets by mouth every 6 (six) hours as needed for moderate pain.  10/09/14  Yes Dorie Rank, MD  LANTUS SOLOSTAR 100 UNIT/ML Solostar Pen Inject 4 Units as directed every 6 (six) hours.  08/31/14  Yes Historical Provider, MD  loratadine (CLARITIN) 10 MG tablet Take 10 mg by mouth 2 (two) times daily.    Yes Historical Provider, MD  metFORMIN (GLUCOPHAGE) 1000 MG tablet Take 1,000 mg  by mouth 2 (two) times daily with a meal.    Yes Historical Provider, MD  metoprolol succinate (TOPROL XL) 25 MG 24 hr tablet Take 0.5 tablets (12.5 mg total) by mouth daily. Patient taking differently: Take 12.5 mg by mouth at bedtime.  05/22/14  Yes Leonie Man, MD  Multiple Vitamin (MULTIVITAMIN WITH MINERALS) TABS tablet Take 1 tablet by mouth daily.   Yes Historical Provider, MD  oxyCODONE-acetaminophen (PERCOCET) 5-325 MG tablet Take 1-2 tablets by mouth every 6 (six) hours as needed. 01/17/15  Yes Wandra Arthurs, MD  pantoprazole (PROTONIX) 40 MG tablet Take 1 tablet (40 mg total) by mouth daily. Patient taking differently: Take 40 mg by mouth every evening.  09/24/14  Yes Thurnell Lose, MD  polyethylene glycol powder (MIRALAX) powder 6 capfuls tomorrow morning. Then 3 capfuls three times per day for 3 days. Then 1 capful three  times per day for 3 days. Then 1 capful per day for 7 days. Patient taking differently: Take 0.5 Containers by mouth daily as needed for mild constipation or moderate constipation.  08/27/14  Yes Addison Lank, MD  pravastatin (PRAVACHOL) 40 MG tablet TAKE 1 TABLET EVERY EVENING 10/26/14  Yes Leonie Man, MD  sucralfate (CARAFATE) 1 GM/10ML suspension Take 10 mLs (1 g total) by mouth 4 (four) times daily -  with meals and at bedtime. 09/24/14  Yes Thurnell Lose, MD  warfarin (COUMADIN) 5 MG tablet Take 1.5 to 2 tablets by mouth daily as directed by coumadin clinic Patient taking differently: Take 5-7.5 mg by mouth daily at 6 PM. Take 5 mg by mouth daily Mon, Wed, and Fri. Take 7.5 mg by mouth daily on all other days. 10/27/14  Yes Troy Sine, MD  nitroGLYCERIN (NITROSTAT) 0.4 MG SL tablet Place 1 tablet (0.4 mg total) under the tongue every 5 (five) minutes x 3 doses as needed for chest pain. 05/22/14   Leonie Man, MD   Physical Exam: Filed Vitals:   03/10/15 1645 03/10/15 1700  BP: 133/74 124/65  Pulse: 73   Temp:    Resp:       General: Well developed, well nourished, NAD, appears stated age  HEENT: NCAT, PERRLA, EOMI, Anicteic Sclera, mucous membranes moist.   Neck: Supple, no JVD, no masses  Cardiovascular: S1 S2 auscultated, no rubs, murmurs or gallops. Regular rate and rhythm.  Respiratory: Clear to auscultation bilaterally with equal chest rise  Abdomen: Soft, nontender, nondistended, + bowel sounds  Extremities: warm dry without cyanosis clubbing or edema  Neuro: AAOx3, cranial nerves grossly intact. Strength 4/5 in in upper ext B/L, 5/5 LE  Skin: Without rashes exudates or nodules  Psych: Normal affect and demeanor with intact judgement and insight  Labs on Admission:  Basic Metabolic Panel:  Recent Labs Lab 03/10/15 1441  NA 137  K 3.7  CL 103  CO2 24  GLUCOSE 166*  BUN 11  CREATININE 1.17  CALCIUM 9.4   Liver Function Tests: No results for  input(s): AST, ALT, ALKPHOS, BILITOT, PROT, ALBUMIN in the last 168 hours. No results for input(s): LIPASE, AMYLASE in the last 168 hours. No results for input(s): AMMONIA in the last 168 hours. CBC:  Recent Labs Lab 03/10/15 1441  WBC 6.6  HGB 11.5*  HCT 36.0*  MCV 93.8  PLT 225   Cardiac Enzymes: No results for input(s): CKTOTAL, CKMB, CKMBINDEX, TROPONINI in the last 168 hours.  BNP (last 3 results)  Recent Labs  08/07/14 2327  09/14/14 1737 09/29/14 1723  BNP 47.8 59.2 49.5    ProBNP (last 3 results) No results for input(s): PROBNP in the last 8760 hours.  CBG: No results for input(s): GLUCAP in the last 168 hours.  Radiological Exams on Admission: Dg Chest 2 View  03/10/2015  CLINICAL DATA:  Chest pain and shortness of breath for 3 days. EXAM: CHEST  2 VIEW COMPARISON:  December 19, 2014. FINDINGS: The heart size and mediastinal contours are within normal limits. Both lungs are clear. No pneumothorax or pleural effusion is noted. Multilevel degenerative disc disease is noted in the thoracic spine. IMPRESSION: No active cardiopulmonary disease. Electronically Signed   By: Marijo Conception, M.D.   On: 03/10/2015 15:12    EKG: Independently reviewed. Sinus rhythm, rate 79, first-degree AV block, PR 280  Assessment/Plan  Chest pain -Patient will be admitted to telemetry for observation -EKG shows no changes when compared to previous EKG -Patient is currently chest pain-free -Troponin negative, will continue to cycle -Will obtain magnesium, phosphate, TSH -Continue statin, metoprolol, aspirin, pain control -Echocardiogram 02/16/2014 showed an EF of 60%, no regional wall motion abnormalities -Will obtain repeat echocardiogram -Lexiscan 07/14/2013: Normal perfusion, EF 60%  Upper arm weakness/chronic neck and back pain -Seems to be an ongoing issue -Patient seen in the emergency department October 2016 had MRI of the cervical, thoracic, lumbar spine -Patient is  following with Dr. Cyndy Freeze, neurosurgery  Atrial fibrillation -Currently in sinus rhythm -S/p ablation 2012 -CHADSVASC 5 (based on age, HTN, DM, CHF) -Continue Cardizem, flecainide, metoprolol -Continue warfarin per pharmacy  Hypothyroidism -Continue Synthroid -Check TSH  Diabetes mellitus, type II -Metformin and Lantus -Will place patient on insulin sliding scale with CBG monitoring  Hyperlipidemia -Continue statin  Normocytic anemia -Hemoglobin currently 11.5, baseline appears to be 8-10 -Continue iron supplementation  Essential hypertension -Continue diltiazem, metoprolol  Tobacco abuse -Discussed smoking cessation  Chronic diastolic heart failure -Stable, compenated -Continue metoprolol, aspirin, statin  DVT prophylaxis: Couamdin  Code Status: Full  Condition: Guarded  Family Communication: None at bedside. Admission, patients condition and plan of care including tests being ordered have been discussed with the patient,  who indicates understanding and agrees with the plan and Code Status.  Disposition Plan: Admitted for observation.  Time spent: 60 minute  Khalifa Knecht D.O. [facility] Pager [phone]  If 7PM-7AM, please contact night-coverage [email or web address] Password [facility] 03/10/2015, 5:21 PM

## 2015-03-10 NOTE — Progress Notes (Signed)
ANTICOAGULATION CONSULT NOTE - Initial Consult  Pharmacy Consult for warfarin Indication: atrial fibrillation  No Known Allergies  Patient Measurements: Height: 5\' 6"  (167.6 cm) Weight: 184 lb 11.9 oz (83.8 kg) (scale C) IBW/kg (Calculated) : 63.8   Vital Signs: Temp: 97.8 F (36.6 C) (12/14 1900) Temp Source: Oral (12/14 1900) BP: 127/58 mmHg (12/14 1900) Pulse Rate: 69 (12/14 1900)  Labs:  Recent Labs  03/10/15 1441  HGB 11.5*  HCT 36.0*  PLT 225  LABPROT 21.9*  INR 1.92*  CREATININE 1.17    Estimated Creatinine Clearance: 55.4 mL/min (by C-G formula based on Cr of 1.17).  Assessment: 10 YOM with history of hypothyroidism, HTN, DM2, dCHF, HLD and AFib who presents with chest pain.  Pharmacy asked to dose warfarin. Home dose of warfarin 7.5mg  daily except 5mg  on MWF. Admit INR today just below goal at 1.92.  Baseline Hgb 11.5, plts 225. No bleeding noted.   Goal of Therapy:  INR 2-3 Monitor platelets by anticoagulation protocol: Yes   Plan:  -warfarin 7.5mg  po x1 tonight -daily INR and CBC -follow for s/s bleeding and any changes to medication list  Rue Valladares D. Honour Schwieger, PharmD, BCPS Clinical Pharmacist Pager: [phone] 03/10/2015 7:11 PM

## 2015-03-10 NOTE — ED Notes (Signed)
Pt sts left sided CP x 3 days with some SOB

## 2015-03-11 ENCOUNTER — Encounter (HOSPITAL_COMMUNITY): Payer: Self-pay | Admitting: Cardiology

## 2015-03-11 ENCOUNTER — Observation Stay (HOSPITAL_COMMUNITY): Payer: Medicare Other

## 2015-03-11 LAB — BASIC METABOLIC PANEL
Anion gap: 9 (ref 5–15)
BUN: 14 mg/dL (ref 6–20)
CO2: 24 mmol/L (ref 22–32)
Calcium: 9.1 mg/dL (ref 8.9–10.3)
Chloride: 106 mmol/L (ref 101–111)
Creatinine, Ser: 1.03 mg/dL (ref 0.61–1.24)
GFR calc Af Amer: >60 mL/min (ref >60)
GLUCOSE: 124 mg/dL — AB (ref 65–99)
POTASSIUM: 4.2 mmol/L (ref 3.5–5.1)
Sodium: 139 mmol/L (ref 135–145)

## 2015-03-11 LAB — CBC
HCT: 35 % — ABNORMAL LOW (ref 39.0–52.0)
HCT: 34.7 % — ABNORMAL LOW (ref 39.0–52.0)
Hemoglobin: 11 g/dL — ABNORMAL LOW (ref 13.0–17.0)
Hemoglobin: 11.4 g/dL — ABNORMAL LOW (ref 13.0–17.0)
MCH: 29.9 pg (ref 26.0–34.0)
MCH: 30.7 pg (ref 26.0–34.0)
MCHC: 31.4 g/dL (ref 30.0–36.0)
MCHC: 32.9 g/dL (ref 30.0–36.0)
MCV: 95.1 fL (ref 78.0–100.0)
MCV: 93.5 fL (ref 78.0–100.0)
PLATELETS: 215 10*3/uL (ref 150–400)
PLATELETS: 222 10*3/uL (ref 150–400)
RBC: 3.68 MIL/uL — AB (ref 4.22–5.81)
RBC: 3.71 MIL/uL — AB (ref 4.22–5.81)
RDW: 15 % (ref 11.5–15.5)
RDW: 14.9 % (ref 11.5–15.5)
WBC: 5.6 10*3/uL (ref 4.0–10.5)
WBC: 6.4 10*3/uL (ref 4.0–10.5)

## 2015-03-11 LAB — TROPONIN I
Troponin I: <0.03 ng/mL (ref <0.031)
Troponin I: <0.03 ng/mL (ref <0.031)

## 2015-03-11 LAB — GLUCOSE, CAPILLARY
GLUCOSE-CAPILLARY: 122 mg/dL — AB (ref 65–99)
Glucose-Capillary: 117 mg/dL — ABNORMAL HIGH (ref 65–99)

## 2015-03-11 LAB — PROTIME-INR
INR: 1.91 — ABNORMAL HIGH (ref 0.00–1.49)
Prothrombin Time: 21.8 seconds — ABNORMAL HIGH (ref 11.6–15.2)

## 2015-03-11 MED ORDER — WARFARIN SODIUM 5 MG PO TABS: ORAL_TABLET | ORAL | Status: DC

## 2015-03-11 MED ORDER — WARFARIN SODIUM 7.5 MG PO TABS
7.5000 mg | ORAL_TABLET | Freq: Once | ORAL | Status: AC
  Administered 2015-03-11: 7.5 mg via ORAL
  Filled 2015-03-11: qty 1

## 2015-03-11 NOTE — Discharge Summary (Signed)
Physician Discharge Summary  [name] [id number] DOB: October 24, 1940 DOA: 03/10/2015  PCP: [name], MD  Admit date: 03/10/2015 Discharge date: 03/11/2015  Time spent: 45 minutes  Recommendations for Outpatient Follow-up:  Patient will be discharged to home.  Patient will need to follow up with primary care provider within one week of discharge.  Patient will also ned to follow up with cardiology on 03/15/2015 at 2:45pm. Have INR checked in 2-3 days. Patient should continue medications as prescribed.  Patient should follow a heart healthy/carb modified diet.    Discharge Diagnoses:  Principal Problem:   Chest pain Active Problems:   Essential hypertension   Coronary artery disease, non-occlusive   DM (diabetes mellitus), type 2 (HCC)   Hyperlipidemia   Tobacco use disorder   Normocytic anemia   Discharge Condition: Stable  Diet recommendation: heart healthy/carb modified  Filed Weights   03/10/15 1900  Weight: 83.8 kg (184 lb 11.9 oz)    History of present illness:  On 03/10/2015  [name] is a 74 y.o. Male with a history of nonobstructive coronary artery disease, atrial fibrillation, hypertension, diabetes mellitus that presented to the emergency department with complaints of chest pain and weakness. Chest pain as been ongoing for 3 days. He states it is located on his left side with no radiation of symptoms. He denied any shortness of breath, nausea, dizziness, diaphoresis with this pain. He states it is sharp in nature and lasts a few minutes. Currently, patient is not having any chest pain. He denies any recent ill contacts or travel. Patient also complains of weakness especially in his upper extremities. He does state he had an MRI and has seen a neurosurgeon who "just gives medications." Patient currently denies any nausea, vomiting, shortness of breath, abdominal pain, dizziness or headache. Patient does see Dr. [name], cardiologist, for his atrial  fibrillation. In the emergency department, patient was found to have no changes in his EKG, troponin was negative. Given his risk factors, [facility] was called for admission.  Hospital Course:  Chest pain -EKG shows no changes when compared to previous EKG -Patient is currently chest pain-free -Troponin cycled and negative x3 -TSH 2.444,  Magnesium 1.8, Phos 4.9 -Continue statin, metoprolol, aspirin, pain control -Echocardiogram 02/16/2014 showed an EF of 60%, no regional wall motion abnormalities -Lexiscan 07/14/2013: Normal perfusion, EF 60% -Echcoardiogram 03/11/2015 EF 50-55% -Patient is to follow up with cardiology as an outpatient, appointment made for 12/19 at 2:45pm  Upper arm weakness/chronic neck and back pain -Seems to be an ongoing issue -Patient seen in the emergency department October 2016 had MRI of the cervical, thoracic, lumbar spine -Patient should follow up with Dr. Cyndy Freeze, neurosurgery  Atrial fibrillation -Currently in sinus rhythm -S/p ablation 2012 -CHADSVASC 5 (based on age, HTN, DM, CHF) -Continue Cardizem, flecainide, metoprolol -Continue warfarin per pharmacy  Hypothyroidism -Continue Synthroid -TSH 2.444  Diabetes mellitus, type II -Metformin and Lantus held, but may resume upon discharge -last hemoglobin A1c was 5.8 on 09/18/2014  Hyperlipidemia -Continue statin  Normocytic anemia -Hemoglobin currently 11.4, baseline appears to be 8-10 -Continue iron supplementation  Essential hypertension -Continue diltiazem, metoprolol  Tobacco abuse -Discussed smoking cessation  Chronic diastolic heart failure -Stable, compenated -Continue metoprolol, aspirin, statin  Procedures: Echocardiogram  Consultations: None  Discharge Exam: Filed Vitals:   03/11/15 0719 03/11/15 1238  BP: 105/59 130/93  Pulse: 58 69  Temp: 98.1 F (36.7 C) 97.9 F (36.6 C)  Resp: 20 18     General: Well developed, well nourished,  NAD  HEENT: NCAT, mucous membranes  moist.   Cardiovascular: S1 S2 auscultated,RRR  Respiratory: Clear to auscultation   Abdomen: Soft, nontender, nondistended, + bowel sounds  Extremities: warm dry without cyanosis clubbing or edema  Neuro: AAOx3, cranial nerves grossly intact. Strength 4/5 in in upper ext B/L, 5/5 LE  Psych: Normal affect and demeanor   Discharge Instructions      Discharge Instructions    Discharge instructions    Complete by:  As directed   Patient will be discharged to home.  Patient will need to follow up with primary care provider within one week of discharge.  Patient will also ned to follow up with cardiology. Have INR checked in 2-3 days. Patient should continue medications as prescribed.  Patient should follow a heart healthy/carb modified diet.   Do not take coumadin this evening.  Start taking 7.5mg  03/12/15.            Medication List    STOP taking these medications        HYDROcodone-acetaminophen 5-325 MG tablet  Commonly known as:  NORCO/VICODIN      TAKE these medications        acetaminophen 500 MG tablet  Commonly known as:  TYLENOL  Take 500 mg by mouth 2 (two) times daily.     albuterol 108 (90 BASE) MCG/ACT inhaler  Commonly known as:  PROVENTIL HFA;VENTOLIN HFA  Inhale 2 puffs into the lungs every 4 (four) hours as needed for wheezing or shortness of breath.     aspirin EC 81 MG tablet  Take 81 mg by mouth daily.     diltiazem 120 MG 24 hr capsule  Commonly known as:  CARDIZEM CD  Take 1 capsule (120 mg total) by mouth daily.     ferrous sulfate 325 (65 FE) MG tablet  Take 325 mg by mouth daily with breakfast.     flecainide 150 MG tablet  Commonly known as:  TAMBOCOR  Take 1.5 tablets (225 mg total) by mouth 2 (two) times daily.     LANTUS SOLOSTAR 100 UNIT/ML Solostar Pen  Generic drug:  Insulin Glargine  Inject 4 Units as directed every 6 (six) hours.     loratadine 10 MG tablet  Commonly known as:  CLARITIN  Take 10 mg by mouth 2 (two)  times daily.     metFORMIN 1000 MG tablet  Commonly known as:  GLUCOPHAGE  Take 1,000 mg by mouth 2 (two) times daily with a meal.     metoprolol succinate 25 MG 24 hr tablet  Commonly known as:  TOPROL XL  Take 0.5 tablets (12.5 mg total) by mouth daily.     multivitamin with minerals Tabs tablet  Take 1 tablet by mouth daily.     nitroGLYCERIN 0.4 MG SL tablet  Commonly known as:  NITROSTAT  Place 1 tablet (0.4 mg total) under the tongue every 5 (five) minutes x 3 doses as needed for chest pain.     oxyCODONE-acetaminophen 5-325 MG tablet  Commonly known as:  PERCOCET  Take 1-2 tablets by mouth every 6 (six) hours as needed.     pantoprazole 40 MG tablet  Commonly known as:  PROTONIX  Take 1 tablet (40 mg total) by mouth daily.     polyethylene glycol powder powder  Commonly known as:  MIRALAX  6 capfuls tomorrow morning. Then 3 capfuls three times per day for 3 days. Then 1 capful three times per day for 3 days. Then 1  capful per day for 7 days.     pravastatin 40 MG tablet  Commonly known as:  PRAVACHOL  TAKE 1 TABLET EVERY EVENING     sucralfate 1 GM/10ML suspension  Commonly known as:  CARAFATE  Take 10 mLs (1 g total) by mouth 4 (four) times daily -  with meals and at bedtime.     warfarin 5 MG tablet  Commonly known as:  COUMADIN  Starting on 03/12/2015: Take 7.5mg  on Monday, Wednesday, Friday, Saturday and Sunday  Take 5mg  on Tuesday and Thursday       No Known Allergies Follow-up Information    Follow up with [name], MD On 03/19/2015.   Specialty:  Family Medicine   Why:  Hospital follow-up at 8:30am   Contact information:   [id number] [place] [id number] [phone]       Follow up On 03/15/2015.       The results of significant diagnostics from this hospitalization (including imaging, microbiology, ancillary and laboratory) are listed below for reference.    Significant Diagnostic Studies: Dg  Chest 2 View  03/10/2015  CLINICAL DATA:  Chest pain and shortness of breath for 3 days. EXAM: CHEST  2 VIEW COMPARISON:  December 19, 2014. FINDINGS: The heart size and mediastinal contours are within normal limits. Both lungs are clear. No pneumothorax or pleural effusion is noted. Multilevel degenerative disc disease is noted in the thoracic spine. IMPRESSION: No active cardiopulmonary disease. Electronically Signed   By: Marijo Conception, M.D.   On: 03/10/2015 15:12    Microbiology: No results found for this or any previous visit (from the past 240 hour(s)).   Labs: Basic Metabolic Panel:  Recent Labs Lab 03/10/15 1441 03/10/15 2024 03/11/15 0845  NA 137  --  139  K 3.7  --  4.2  CL 103  --  106  CO2 24  --  24  GLUCOSE 166*  --  124*  BUN 11  --  14  CREATININE 1.17  --  1.03  CALCIUM 9.4  --  9.1  MG  --  1.8  --   PHOS  --  4.9*  --    Liver Function Tests: No results for input(s): AST, ALT, ALKPHOS, BILITOT, PROT, ALBUMIN in the last 168 hours. No results for input(s): LIPASE, AMYLASE in the last 168 hours. No results for input(s): AMMONIA in the last 168 hours. CBC:  Recent Labs Lab 03/10/15 1441 03/11/15 0118 03/11/15 0845  WBC 6.6 5.6 6.4  HGB 11.5* 11.0* 11.4*  HCT 36.0* 35.0* 34.7*  MCV 93.8 95.1 93.5  PLT 225 215 222   Cardiac Enzymes:  Recent Labs Lab 03/10/15 2024 03/11/15 0118 03/11/15 0845  TROPONINI <0.03 <0.03 <0.03   BNP: BNP (last 3 results)  Recent Labs  08/07/14 2327 09/14/14 1737 09/29/14 1723  BNP 47.8 59.2 49.5    ProBNP (last 3 results) No results for input(s): PROBNP in the last 8760 hours.  CBG:  Recent Labs Lab 03/10/15 2145 03/11/15 0637 03/11/15 1237  GLUCAP 156* 122* 117*       Signed:  Cristal Ford  [facility] 03/11/2015, 3:30 PM

## 2015-03-11 NOTE — Progress Notes (Addendum)
ANTICOAGULATION CONSULT NOTE - Follow Up Consult  Pharmacy Consult for warfarin Indication: atrial fibrillation  No Known Allergies  Patient Measurements: Height: 5\' 6"  (167.6 cm) Weight: 184 lb 11.9 oz (83.8 kg) (scale C) IBW/kg (Calculated) : 63.8  Vital Signs: Temp: 98.1 F (36.7 C) (12/15 0719) Temp Source: Oral (12/15 0719) BP: 105/59 mmHg (12/15 0719) Pulse Rate: 58 (12/15 0719)  Labs:  Recent Labs  03/10/15 1441 03/10/15 2024 03/11/15 0118 03/11/15 0845  HGB 11.5*  --  11.0* 11.4*  HCT 36.0*  --  35.0* 34.7*  PLT 225  --  215 222  LABPROT 21.9*  --  21.8*  --   INR 1.92*  --  1.91*  --   CREATININE 1.17  --   --   --   TROPONINI  --  <0.03 <0.03  --     Estimated Creatinine Clearance: 55.4 mL/min (by C-G formula based on Cr of 1.17).   Assessment: 2 yoM admitted 03/10/2015 on warfarin prior to admission for Afib. Pharmacy consulted to dose warfarin. Patient states he does not miss any doses at home.  PTA dose: Warfarin 7.5 mg daily, except 5 mg MWF.  Current INR 1.91, H/H stable, Plt wnl. No s/sx of bleeding noted.  Goal of Therapy:  INR 2-3 Monitor platelets by anticoagulation protocol: Yes   Plan:  - Give warfarin 7.5 mg x1 tonight - Monitor daily INR, CBC, s/sx of bleeding - Recommend to change regimen to 7.5 mg daily, except 5 mg Tuesday and Thursdays - D/c Lovenox once INR therapeutic  Dimitri Ped, PharmD. PGY-1 Pharmacy Resident Pager: [phone]  03/11/2015,9:16 AM

## 2015-03-11 NOTE — Discharge Instructions (Signed)
Nonspecific Chest Pain  °Chest pain can be caused by many different conditions. There is always a chance that your pain could be related to something serious, such as a heart attack or a blood clot in your lungs. Chest pain can also be caused by conditions that are not life-threatening. If you have chest pain, it is very important to follow up with your health care provider. °CAUSES  °Chest pain can be caused by: °· Heartburn. °· Pneumonia or bronchitis. °· Anxiety or stress. °· Inflammation around your heart (pericarditis) or lung (pleuritis or pleurisy). °· A blood clot in your lung. °· A collapsed lung (pneumothorax). It can develop suddenly on its own (spontaneous pneumothorax) or from trauma to the chest. °· Shingles infection (varicella-zoster virus). °· Heart attack. °· Damage to the bones, muscles, and cartilage that make up your chest wall. This can include: °¨ Bruised bones due to injury. °¨ Strained muscles or cartilage due to frequent or repeated coughing or overwork. °¨ Fracture to one or more ribs. °¨ Sore cartilage due to inflammation (costochondritis). °RISK FACTORS  °Risk factors for chest pain may include: °· Activities that increase your risk for trauma or injury to your chest. °· Respiratory infections or conditions that cause frequent coughing. °· Medical conditions or overeating that can cause heartburn. °· Heart disease or family history of heart disease. °· Conditions or health behaviors that increase your risk of developing a blood clot. °· Having had chicken pox (varicella zoster). °SIGNS AND SYMPTOMS °Chest pain can feel like: °· Burning or tingling on the surface of your chest or deep in your chest. °· Crushing, pressure, aching, or squeezing pain. °· Dull or sharp pain that is worse when you move, cough, or take a deep breath. °· Pain that is also felt in your back, neck, shoulder, or arm, or pain that spreads to any of these areas. °Your chest pain may come and go, or it may stay  constant. °DIAGNOSIS °Lab tests or other studies may be needed to find the cause of your pain. Your health care provider may have you take a test called an ambulatory ECG (electrocardiogram). An ECG records your heartbeat patterns at the time the test is performed. You may also have other tests, such as: °· Transthoracic echocardiogram (TTE). During echocardiography, sound waves are used to create a picture of all of the heart structures and to look at how blood flows through your heart. °· Transesophageal echocardiogram (TEE). This is a more advanced imaging test that obtains images from inside your body. It allows your health care provider to see your heart in finer detail. °· Cardiac monitoring. This allows your health care provider to monitor your heart rate and rhythm in real time. °· Holter monitor. This is a portable device that records your heartbeat and can help to diagnose abnormal heartbeats. It allows your health care provider to track your heart activity for several days, if needed. °· Stress tests. These can be done through exercise or by taking medicine that makes your heart beat more quickly. °· Blood tests. °· Imaging tests. °TREATMENT  °Your treatment depends on what is causing your chest pain. Treatment may include: °· Medicines. These may include: °¨ Acid blockers for heartburn. °¨ Anti-inflammatory medicine. °¨ Pain medicine for inflammatory conditions. °¨ Antibiotic medicine, if an infection is present. °¨ Medicines to dissolve blood clots. °¨ Medicines to treat coronary artery disease. °· Supportive care for conditions that do not require medicines. This may include: °¨ Resting. °¨ Applying heat   or cold packs to injured areas.  Limiting activities until pain decreases. HOME CARE INSTRUCTIONS  If you were prescribed an antibiotic medicine, finish it all even if you start to feel better.  Avoid any activities that bring on chest pain.  Do not use any tobacco products, including  cigarettes, chewing tobacco, or electronic cigarettes. If you need help quitting, ask your health care provider.  Do not drink alcohol.  Take medicines only as directed by your health care provider.  Keep all follow-up visits as directed by your health care provider. This is important. This includes any further testing if your chest pain does not go away.  If heartburn is the cause for your chest pain, you may be told to keep your head raised (elevated) while sleeping. This reduces the chance that acid will go from your stomach into your esophagus.  Make lifestyle changes as directed by your health care provider. These may include:  Getting regular exercise. Ask your health care provider to suggest some activities that are safe for you.  Eating a heart-healthy diet. A registered dietitian can help you to learn healthy eating options.  Maintaining a healthy weight.  Managing diabetes, if necessary.  Reducing stress. SEEK MEDICAL CARE IF:  Your chest pain does not go away after treatment.  You have a rash with blisters on your chest.  You have a fever. SEEK IMMEDIATE MEDICAL CARE IF:   Your chest pain is worse.  You have an increasing cough, or you cough up blood.  You have severe abdominal pain.  You have severe weakness.  You faint.  You have chills.  You have sudden, unexplained chest discomfort.  You have sudden, unexplained discomfort in your arms, back, neck, or jaw.  You have shortness of breath at any time.  You suddenly start to sweat, or your skin gets clammy.  You feel nauseous or you vomit.  You suddenly feel light-headed or dizzy.  Your heart begins to beat quickly, or it feels like it is skipping beats. These symptoms may represent a serious problem that is an emergency. Do not wait to see if the symptoms will go away. Get medical help right away. Call your local emergency services (911 in the [place] Do not drive yourself to the hospital.   This  information is not intended to replace advice given to you by your health care provider. Make sure you discuss any questions you have with your health care provider.   Document Released: 12/21/2004 Document Revised: 04/03/2014 Document Reviewed: 10/17/2013 Elsevier Interactive Patient Education 2016 [place] on my medicine - Coumadin   (Warfarin)  Why was Coumadin prescribed for you? Coumadin was prescribed for you because you have a blood clot or a medical condition that can cause an increased risk of forming blood clots. Blood clots can cause serious health problems by blocking the flow of blood to the heart, lung, or brain. Coumadin can prevent harmful blood clots from forming. As a reminder your indication for Coumadin is:   Stroke Prevention Because Of Atrial Fibrillation  What test will check on my response to Coumadin? While on Coumadin (warfarin) you will need to have an INR test regularly to ensure that your dose is keeping you in the desired range. The INR (international normalized ratio) number is calculated from the result of the laboratory test called prothrombin time (PT).  If an INR APPOINTMENT HAS NOT ALREADY BEEN MADE FOR YOU please schedule an appointment to have this  lab work done by your health care provider within 7 days. Your INR goal is usually a number between:  2 to 3 or your provider may give you a more narrow range like 2-2.5.  Ask your health care provider during an office visit what your goal INR is.  What  do you need to  know  About  COUMADIN? Take Coumadin (warfarin) exactly as prescribed by your healthcare provider about the same time each day.  DO NOT stop taking without talking to the doctor who prescribed the medication.  Stopping without other blood clot prevention medication to take the place of Coumadin may increase your risk of developing a new clot or stroke.  Get refills before you run out.  What do you do if you miss a dose? If  you miss a dose, take it as soon as you remember on the same day then continue your regularly scheduled regimen the next day.  Do not take two doses of Coumadin at the same time.  Important Safety Information A possible side effect of Coumadin (Warfarin) is an increased risk of bleeding. You should call your healthcare provider right away if you experience any of the following: ? Bleeding from an injury or your nose that does not stop. ? Unusual colored urine (red or dark brown) or unusual colored stools (red or black). ? Unusual bruising for unknown reasons. ? A serious fall or if you hit your head (even if there is no bleeding).  Some foods or medicines interact with Coumadin (warfarin) and might alter your response to warfarin. To help avoid this: ? Eat a balanced diet, maintaining a consistent amount of Vitamin K. ? Notify your provider about major diet changes you plan to make. ? Avoid alcohol or limit your intake to 1 drink for women and 2 drinks for men per day. (1 drink is 5 oz. wine, 12 oz. beer, or 1.5 oz. liquor.)  Make sure that ANY health care provider who prescribes medication for you knows that you are taking Coumadin (warfarin).  Also make sure the healthcare provider who is monitoring your Coumadin knows when you have started a new medication including herbals and non-prescription products.  Coumadin (Warfarin)  Major Drug Interactions  Increased Warfarin Effect Decreased Warfarin Effect  Alcohol (large quantities) Antibiotics (esp. Septra/Bactrim, Flagyl, Cipro) Amiodarone (Cordarone) Aspirin (ASA) Cimetidine (Tagamet) Megestrol (Megace) NSAIDs (ibuprofen, naproxen, etc.) Piroxicam (Feldene) Propafenone (Rythmol SR) Propranolol (Inderal) Isoniazid (INH) Posaconazole (Noxafil) Barbiturates (Phenobarbital) Carbamazepine (Tegretol) Chlordiazepoxide (Librium) Cholestyramine (Questran) Griseofulvin Oral Contraceptives Rifampin Sucralfate (Carafate) Vitamin K    Coumadin (Warfarin) Major Herbal Interactions  Increased Warfarin Effect Decreased Warfarin Effect  Garlic Ginseng Ginkgo biloba Coenzyme Q10 Green tea [place]. Johns wort    Coumadin (Warfarin) FOOD Interactions  Eat a consistent number of servings per week of foods HIGH in Vitamin K (1 serving =  cup)  Collards (cooked, or boiled & drained) Kale (cooked, or boiled & drained) Mustard greens (cooked, or boiled & drained) Parsley *serving size only =  cup Spinach (cooked, or boiled & drained) Swiss chard (cooked, or boiled & drained) Turnip greens (cooked, or boiled & drained)  Eat a consistent number of servings per week of foods MEDIUM-HIGH in Vitamin K (1 serving = 1 cup)  Asparagus (cooked, or boiled & drained) Broccoli (cooked, boiled & drained, or raw & chopped) Brussel sprouts (cooked, or boiled & drained) *serving size only =  cup Lettuce, raw (green leaf, endive, romaine) Spinach, raw Turnip greens, raw & chopped  These websites have more information on Coumadin (warfarin):  FailFactory.se; VeganReport.com.au;

## 2015-03-11 NOTE — Progress Notes (Signed)
  Echocardiogram 2D Echocardiogram has been performed.  Jennette Dubin 03/11/2015, 12:26 PM

## 2015-03-11 NOTE — Progress Notes (Signed)
Pt has orders to be discharged. Discharge instructions given and pt has no additional questions at this time. Medication regimen reviewed and pt educated. Prescription and today's coumadin dose given. Pt verbalized understanding and has no additional questions. Telemetry box removed. IV removed and site in good condition. Pt stable and waiting for transportation.

## 2015-03-11 NOTE — Care Management Obs Status (Signed)
[place] NOTIFICATION   Patient Details  Name: [name] MRN: [id number] Date of Birth: Apr 19, 1940   Medicare Observation Status Notification Given:   yes    Royston Bake, RN 03/11/2015, 3:32 PM

## 2015-03-12 ENCOUNTER — Encounter (HOSPITAL_COMMUNITY): Payer: Self-pay | Admitting: *Deleted

## 2015-03-12 ENCOUNTER — Emergency Department (HOSPITAL_COMMUNITY): Payer: Medicare Other

## 2015-03-12 ENCOUNTER — Ambulatory Visit: Payer: Medicare Other | Admitting: Pharmacist Clinician (PhC)/ Clinical Pharmacy Specialist

## 2015-03-12 ENCOUNTER — Telehealth: Payer: Self-pay | Admitting: Pharmacist Clinician (PhC)/ Clinical Pharmacy Specialist

## 2015-03-12 ENCOUNTER — Emergency Department (HOSPITAL_COMMUNITY)
Admission: EM | Admit: 2015-03-12 | Discharge: 2015-03-12 | Disposition: A | Discharge status: Home / Self Care | Payer: Medicare Other | Attending: Emergency Medicine | Admitting: Emergency Medicine

## 2015-03-12 DIAGNOSIS — J449 Chronic obstructive pulmonary disease, unspecified: Secondary | ICD-10-CM | POA: Insufficient documentation

## 2015-03-12 DIAGNOSIS — M1990 Unspecified osteoarthritis, unspecified site: Secondary | ICD-10-CM | POA: Diagnosis not present

## 2015-03-12 DIAGNOSIS — Y9389 Activity, other specified: Secondary | ICD-10-CM | POA: Diagnosis not present

## 2015-03-12 DIAGNOSIS — Z862 Personal history of diseases of the blood and blood-forming organs and certain disorders involving the immune mechanism: Secondary | ICD-10-CM | POA: Diagnosis not present

## 2015-03-12 DIAGNOSIS — W1839XA Other fall on same level, initial encounter: Secondary | ICD-10-CM | POA: Diagnosis not present

## 2015-03-12 DIAGNOSIS — I2510 Atherosclerotic heart disease of native coronary artery without angina pectoris: Secondary | ICD-10-CM | POA: Diagnosis not present

## 2015-03-12 DIAGNOSIS — Y9289 Other specified places as the place of occurrence of the external cause: Secondary | ICD-10-CM | POA: Diagnosis not present

## 2015-03-12 DIAGNOSIS — E119 Type 2 diabetes mellitus without complications: Secondary | ICD-10-CM | POA: Diagnosis not present

## 2015-03-12 DIAGNOSIS — I5032 Chronic diastolic (congestive) heart failure: Secondary | ICD-10-CM | POA: Insufficient documentation

## 2015-03-12 DIAGNOSIS — Y998 Other external cause status: Secondary | ICD-10-CM | POA: Insufficient documentation

## 2015-03-12 DIAGNOSIS — S4992XA Unspecified injury of left shoulder and upper arm, initial encounter: Secondary | ICD-10-CM | POA: Insufficient documentation

## 2015-03-12 DIAGNOSIS — F17210 Nicotine dependence, cigarettes, uncomplicated: Secondary | ICD-10-CM | POA: Diagnosis not present

## 2015-03-12 DIAGNOSIS — E785 Hyperlipidemia, unspecified: Secondary | ICD-10-CM | POA: Diagnosis not present

## 2015-03-12 DIAGNOSIS — I10 Essential (primary) hypertension: Secondary | ICD-10-CM | POA: Insufficient documentation

## 2015-03-12 DIAGNOSIS — Z8701 Personal history of pneumonia (recurrent): Secondary | ICD-10-CM | POA: Insufficient documentation

## 2015-03-12 DIAGNOSIS — Z79899 Other long term (current) drug therapy: Secondary | ICD-10-CM | POA: Insufficient documentation

## 2015-03-12 DIAGNOSIS — Z7982 Long term (current) use of aspirin: Secondary | ICD-10-CM | POA: Insufficient documentation

## 2015-03-12 MED ORDER — ACETAMINOPHEN 500 MG PO TABS: 500.0000 mg | ORAL_TABLET | Freq: Two times a day (BID) | ORAL | Status: DC

## 2015-03-12 NOTE — ED Notes (Signed)
PA at bedside.

## 2015-03-12 NOTE — ED Notes (Signed)
Pt able to ambulate independently 

## 2015-03-12 NOTE — Discharge Instructions (Signed)
RICE for Routine Care of Injuries Theroutine careofmanyinjuriesincludes rest, ice, compression, and elevation (RICE therapy). RICE therapy is often recommended for injuries to soft tissues, such as a muscle strain, ligament injuries, bruises, and overuse injuries. It can also be used for some bony injuries. Using RICE therapy can help to relieve pain, lessen swelling, and enable your body to heal. Rest Rest is required to allow your body to heal. This usually involves reducing your normal activities and avoiding use of the injured part of your body. Generally, you can return to your normal activities when you are comfortable and have been given permission by your health care provider. Ice Icing your injury helps to keep the swelling down, and it lessens pain. Do not apply ice directly to your skin.  Put ice in a plastic bag.  Place a towel between your skin and the bag.  Leave the ice on for 20 minutes, 2-3 times a day. Do this for as long as you are directed by your health care provider. Compression Compression means putting pressure on the injured area. Compression helps to keep swelling down, gives support, and helps with discomfort. Compression may be done with an elastic bandage. If an elastic bandage has been applied, follow these general tips:  Remove and reapply the bandage every 3-4 hours or as directed by your health care provider.  Make sure the bandage is not wrapped too tightly, because this can cut off circulation. If part of your body beyond the bandage becomes blue, numb, cold, swollen, or more painful, your bandage is most likely too tight. If this occurs, remove your bandage and reapply it more loosely.  See your health care provider if the bandage seems to be making your problems worse rather than better. Elevation Elevation means keeping the injured area raised. This helps to lessen swelling and decrease pain. If possible, your injured area should be elevated at or  above the level of your heart or the center of your chest. [place]? You should seek medical care if:  Your pain and swelling continue.  Your symptoms are getting worse rather than improving. These symptoms may indicate that further evaluation or further X-rays are needed. Sometimes, X-rays may not show a small broken bone (fracture) until a number of days later. Make a follow-up appointment with your health care provider. WHEN SHOULD I SEEK IMMEDIATE MEDICAL CARE? You should seek immediate medical care if:  You have sudden severe pain at or below the area of your injury.  You have redness or increased swelling around your injury.  You have tingling or numbness at or below the area of your injury that does not improve after you remove the elastic bandage.   This information is not intended to replace advice given to you by your health care provider. Make sure you discuss any questions you have with your health care provider.   Document Released: 06/25/2000 Document Revised: 12/02/2014 Document Reviewed: 02/18/2014 Elsevier Interactive Patient Education 2016 [employer].  How to Use a Sling A sling is a type of hanging bandage. You wear it around your neck to protect an injured arm, shoulder, or other body part. You may need to wear a sling to keep you from moving the injured body part while it heals. Keeping the injured part of your body still reduces pain and speeds up healing. Your doctor may suggest you use a sling if you have:  A broken arm.  A broken collarbone.  A shoulder  injury.  Surgery. RISKS AND COMPLICATIONS Wearing a sling the wrong way can:  Make your injury worse.  Cause stiffness or numbness.  Affect blood circulation in your arm and hand. This can cause tingling or numbness in your fingers or hands. HOW TO USE A SLING The way that you should use a sling depends on your injury. It is important that you follow all of your doctor's  instructions for your injury. Also follow these general suggestions:  Wear the sling so that your arm bends 90 degrees at the elbow. That is like a right angle or the shape of a capital letter "L." The sling should also support your wrist and hand.  Try not to move your arm.  Do not lie down flat on your back while you have to wear a sling. Sleep in a recliner or use pillows to raise your upper body in bed.  Do not twist, raise, or move your arm in a way that could make your injury worse.  Do not lean on your arm while you have to wear a sling.  Do not lift anything while you have to wear a sling. GET HELP IF:  You have bruising, swelling, or pain that is getting worse.  Your pain medicine is not helping.  You have a fever. GET HELP RIGHT AWAY IF:  Your fingers are numb or tingling.  Your fingers turn blue or feel cold to the touch.  You cannot control the bleeding from your injury.  You are short of breath.   This information is not intended to replace advice given to you by your health care provider. Make sure you discuss any questions you have with your health care provider.   Document Released: 06/07/2009 Document Revised: 04/03/2014 Document Reviewed: 01/14/2014 Elsevier Interactive Patient Education [employer].

## 2015-03-12 NOTE — ED Provider Notes (Signed)
CSN: [id number]     Arrival date & time 03/12/15  1422 History  By signing my name below, I, Starleen Arms, attest that this documentation has been prepared under the direction and in the presence of Domenic Moras, PA-C. Electronically Signed: Starleen Arms ED Scribe. 03/12/2015. 4:26 PM.    Chief Complaint  Patient presents with  . Fall   The history is provided by the patient. No language interpreter was used.   HPI Comments: [name] is a 74 y.o. male who presents to the Emergency Department complaining of a mechanical fall that occurred last night after the patient slipped.  He reports he landed on his left shoulder and complains currently of aching, severe, worse with ROM right shoulder pain.  No treatments have been tried.  Patient uses warfarin.  He denies head trauma, new back pain, new neck pain, chest or abdominal pain. No numbness or weakness. No precipitating sxs prior to the fall.   NKA.   PCP: Dr. Coletta Memos Past Medical History  Diagnosis Date  . Atrial flutter ([place])     a. 07/2010 Status post caval tricuspid isthmus ablation by Dr. Midge Aver Metoprolol daily  . PAF (paroxysmal atrial fibrillation) ([place])     a. Recurrent after atrial flutter, currently controlled on flecainide plus diltiazem  . Hyperlipidemia     takes Pravastatin daily  . Diastolic CHF, chronic ([place])     a. 12/2012 EF 55-60%, diast dysfxn, triv MR, mildly dil LA/RA.  [name] Coronary artery disease, non-occlusive     a. 03/2010 Nonocclusive disease by cath, performed for ST elevations on ECG;  b. 06/2013 Lexi MV: EF 60%, no ischemia.  . Cervical radiculopathy due to degenerative joint disease of spine   . Claudication [facility])     a. lower ext dopplers 08/18/11-normal ABIs bilaterally with normal triphasic waveforms  . HTN (hypertension)     takes Diltiazem daily  . Dysrhythmia     HX OF ATRIAL IFB /FLUTTER takes Flecanide and Coumadin daily  . History of bronchitis     1998  . Weakness     numbness and  tingling both hands  . Joint pain   . History of gastric ulcer   . Anemia     takes Ferrous Sulfate daily  . History of blood transfusion 1982    "when I had stomach OR"  . Balance problem 01/2014  . Diabetes mellitus type II     takes Metformin and Lantus daily  . Pneumonia 1999  . CAP (community acquired pneumonia) 09/18/2014  . Arthritis     "all over"  . COPD (chronic obstructive pulmonary disease) [facility])    Past Surgical History  Procedure Laterality Date  . Atrial ablation surgery  08/05/10    CTI ablation for atrial flutter by JA  . Cardioversion  12/07/2010     Successful direct current cardioversion with atrial fibrillation to normal sinus rhythm  . Partial gastrectomy  1982    subtotal; "took out 30% for ulcers"  . Nm myoview ltd  07/22/2013    Normal EF ~60%, no ischemia or infarction.  . Colonoscopy [name] 12/02/2013    Procedure: COLONOSCOPY;  Surgeon: Irene Shipper, MD;  Location: [facility];  Service: Endoscopy;  Laterality: [name];  . Cardiac catheterization  2012/2013/2015    nl LV function, no occlusive CAD, PAF  . Knee bursectomy Left 1998  . Incision and drainage abscess / hematoma of bursa / knee / thigh Left 1998    knee  .  Cataract extraction w/ intraocular lens  implant, bilateral Bilateral   . Yag laser application Bilateral   . Carpal tunnel release Bilateral 01/30/2014    Procedure: BILATERAL CARPAL TUNNEL RELEASE;  Surgeon: Marianna Payment, MD;  Location: [facility];  Service: Orthopedics;  Laterality: Bilateral;  . Transthoracic echocardiogram  02/16/2014    EF 60%, no RWMA. - otherwise normal  . Laparoscopic cholecystectomy  03/2010  . Esophagogastroduodenoscopy [name] 09/22/2014    Procedure: ESOPHAGOGASTRODUODENOSCOPY (EGD);  Surgeon: Ronald Lobo, MD;  Location: [facility] ENDOSCOPY;  Service: Endoscopy;  Laterality: [name];   Family History  Problem Relation Age of Onset  . Cancer Mother   . Heart attack Father   . Heart attack Brother    Social History   Substance Use Topics  . Smoking status: Current Every Day Smoker -- 0.50 packs/day for 55 years    Types: Cigarettes  . Smokeless tobacco: Never Used     Comment: He's been smoking between 0.5 and 2 ppd since age 52.  [name] Alcohol Use: 0.0 oz/week    0 Standard drinks or equivalent per week     Comment: "quit drinking in 1986"    Review of Systems  Constitutional: Negative for fever.  Musculoskeletal: Positive for arthralgias. Negative for back pain and neck pain.      Allergies  Review of patient's allergies indicates no known allergies.  Home Medications   Prior to Admission medications   Medication Sig Start Date End Date Taking? Authorizing Provider  acetaminophen (TYLENOL) 500 MG tablet Take 500 mg by mouth 2 (two) times daily.    Historical Provider, MD  albuterol (PROVENTIL HFA;VENTOLIN HFA) 108 (90 BASE) MCG/ACT inhaler Inhale 2 puffs into the lungs every 4 (four) hours as needed for wheezing or shortness of breath. 09/14/14   Sherwood Gambler, MD  aspirin EC 81 MG tablet Take 81 mg by mouth daily.    Historical Provider, MD  diltiazem (CARDIZEM CD) 120 MG 24 hr capsule Take 1 capsule (120 mg total) by mouth daily. 04/03/14   Erlene Quan, PA-C  ferrous sulfate 325 (65 FE) MG tablet Take 325 mg by mouth daily with breakfast.    Historical Provider, MD  flecainide (TAMBOCOR) 150 MG tablet Take 1.5 tablets (225 mg total) by mouth 2 (two) times daily. 12/01/14   Leonie Man, MD  LANTUS SOLOSTAR 100 UNIT/ML Solostar Pen Inject 4 Units as directed every 6 (six) hours.  08/31/14   Historical Provider, MD  loratadine (CLARITIN) 10 MG tablet Take 10 mg by mouth 2 (two) times daily.     Historical Provider, MD  metFORMIN (GLUCOPHAGE) 1000 MG tablet Take 1,000 mg by mouth 2 (two) times daily with a meal.     Historical Provider, MD  metoprolol succinate (TOPROL XL) 25 MG 24 hr tablet Take 0.5 tablets (12.5 mg total) by mouth daily. Patient taking differently: Take 12.5 mg by mouth at  bedtime.  05/22/14   Leonie Man, MD  Multiple Vitamin (MULTIVITAMIN WITH MINERALS) TABS tablet Take 1 tablet by mouth daily.    Historical Provider, MD  nitroGLYCERIN (NITROSTAT) 0.4 MG SL tablet Place 1 tablet (0.4 mg total) under the tongue every 5 (five) minutes x 3 doses as needed for chest pain. 05/22/14   Leonie Man, MD  oxyCODONE-acetaminophen (PERCOCET) 5-325 MG tablet Take 1-2 tablets by mouth every 6 (six) hours as needed. 01/17/15   Wandra Arthurs, MD  pantoprazole (PROTONIX) 40 MG tablet Take 1 tablet (40 mg total)  by mouth daily. Patient taking differently: Take 40 mg by mouth every evening.  09/24/14   Thurnell Lose, MD  polyethylene glycol powder (MIRALAX) powder 6 capfuls tomorrow morning. Then 3 capfuls three times per day for 3 days. Then 1 capful three times per day for 3 days. Then 1 capful per day for 7 days. Patient taking differently: Take 0.5 Containers by mouth daily as needed for mild constipation or moderate constipation.  08/27/14   Addison Lank, MD  pravastatin (PRAVACHOL) 40 MG tablet TAKE 1 TABLET EVERY EVENING 10/26/14   Leonie Man, MD  sucralfate (CARAFATE) 1 GM/10ML suspension Take 10 mLs (1 g total) by mouth 4 (four) times daily -  with meals and at bedtime. 09/24/14   Thurnell Lose, MD  warfarin (COUMADIN) 5 MG tablet Starting on 03/12/2015: Take 7.5mg  on Monday, Wednesday, Friday, Saturday and Sunday  Take 5mg  on Tuesday and Thursday 03/11/15   Maryann Mikhail, DO   BP 127/113 mmHg  Pulse 89  Temp(Src) 98 F (36.7 C) (Oral)  Resp 18  Ht 5\' 6"  (1.676 m)  Wt 184 lb (83.462 kg)  BMI 29.71 kg/m2  SpO2 97% Physical Exam  Constitutional: He is oriented to person, place, and time. He appears well-developed and well-nourished. No distress.  HENT:  Head: Normocephalic and atraumatic.  Scalp nontender  Eyes: Conjunctivae and EOM are normal.  Neck: Neck supple. No tracheal deviation present.  Cardiovascular: Normal rate.   Pulmonary/Chest: Effort  normal. No respiratory distress. He exhibits no tenderness.  Abdominal: There is no tenderness.  No LUQ pain  Musculoskeletal: Normal range of motion. He exhibits tenderness.  Left shoulder: tenderness to glenohumeral joint on palpation and along the lateral deltoid without any gross deformity or bruising.  Increasing pain with shoulder flexion and abduction without any obvious difficulties with ROM.  Elbow and wrist nontender.  Sensation intact.  No cervical tenderness.   Neurological: He is alert and oriented to person, place, and time.  Skin: Skin is warm and dry.  Psychiatric: He has a normal mood and affect. His behavior is normal.  Nursing note and vitals reviewed.   ED Course  Procedures (including critical care time) DIAGNOSTIC STUDIES: Oxygen Saturation is 97% on RA, normal by my interpretation.    COORDINATION OF CARE:  4:28 PM Discussed normal imaging with patient.  Patient should f/u with PCP.  Patient acknowledges and agrees with plan.    Imaging Review Dg Shoulder Left  03/12/2015  CLINICAL DATA:  74 year old male who fell at 5 p.m. last night landing on heart floor. Persistent left shoulder pain. Initial encounter. EXAM: LEFT SHOULDER - 2+ VIEW COMPARISON:  09/24/2012. FINDINGS: No glenohumeral joint dislocation. Visible left clavicle intact. No left scapula fracture identified. Proximal left humerus appears intact with some chronic degenerative osseous changes at the rotator cuff attachment site including subchondral cysts. Visible left ribs and lung parenchyma appear stable. IMPRESSION: No acute fracture or dislocation identified about the left shoulder. Electronically Signed   By: Genevie Ann M.D.   On: 03/12/2015 15:15   I have personally reviewed and evaluated these images and lab results as part of my medical decision-making.    MDM   Final diagnoses:  Shoulder injury, left, initial encounter    BP 127/113 mmHg  Pulse 89  Temp(Src) 98 F (36.7 C) (Oral)  Resp  18  Ht 5\' 6"  (1.676 m)  Wt 83.462 kg  BMI 29.71 kg/m2  SpO2 97%   I personally performed the  services described in this documentation, which was scribed in my presence. The recorded information has been reviewed and is accurate.     5:00 PM Pt had a mechanical fall, presents with L shoulder pain. Xray neg for acute fx/dislocation.  Pt is NVI.  Pt is on warfarin but has no headache or neck pain. No cp or abd pain.  Sling provide for support, care discussed with Dr. Alvino Chapel who agrees with plan.   Domenic Moras, PA-C 03/12/15 1704  Davonna Belling, MD 03/12/15 [phone]

## 2015-03-12 NOTE — Telephone Encounter (Signed)
Spoke with dtr, pt d/c from hospital yesterday, was told to increase warfarin to 7.5 mg daily (from 7.5 mg qd x 5 mg MWF).  Wanted to clarify before increasing dose.    Returned call, advised dtr to give 7.5 mg today (INR on 12/15 was 1.9), then start with 7.5 mg qd x 5 mg MF.  Pt has appt with [place] on Friday Dec 23.  Daughter voiced understanding.  Note that they are currently in route to ER, as patient fell yesterday after leaving hospital, having shoulder pain.

## 2015-03-12 NOTE — ED Notes (Signed)
Pt reports tripping and falling last night on his left shoulder. Pt able to move arm. Pt reports no other injuries. Denies hitting head.

## 2015-03-15 ENCOUNTER — Ambulatory Visit: Payer: Medicare Other | Admitting: Pharmacist Clinician (PhC)/ Clinical Pharmacy Specialist

## 2015-03-15 ENCOUNTER — Ambulatory Visit: Payer: Medicare Other | Admitting: Physician Assistant

## 2015-03-19 ENCOUNTER — Ambulatory Visit (INDEPENDENT_AMBULATORY_CARE_PROVIDER_SITE_OTHER): Payer: Medicare Other | Admitting: Pharmacist Clinician (PhC)/ Clinical Pharmacy Specialist

## 2015-03-19 ENCOUNTER — Ambulatory Visit (INDEPENDENT_AMBULATORY_CARE_PROVIDER_SITE_OTHER): Payer: Medicare Other | Admitting: Cardiology

## 2015-03-19 ENCOUNTER — Encounter: Payer: Self-pay | Admitting: Cardiology

## 2015-03-19 VITALS — BP 135/70 | HR 77 | Ht 66.0 in | Wt 185.4 lb

## 2015-03-19 DIAGNOSIS — I2510 Atherosclerotic heart disease of native coronary artery without angina pectoris: Secondary | ICD-10-CM

## 2015-03-19 DIAGNOSIS — I480 Paroxysmal atrial fibrillation: Secondary | ICD-10-CM | POA: Diagnosis not present

## 2015-03-19 DIAGNOSIS — Z7901 Long term (current) use of anticoagulants: Secondary | ICD-10-CM | POA: Diagnosis not present

## 2015-03-19 DIAGNOSIS — Z5181 Encounter for therapeutic drug level monitoring: Secondary | ICD-10-CM | POA: Diagnosis not present

## 2015-03-19 DIAGNOSIS — I10 Essential (primary) hypertension: Secondary | ICD-10-CM | POA: Diagnosis not present

## 2015-03-19 LAB — POCT INR: INR: 2.9

## 2015-03-19 MED ORDER — DILTIAZEM HCL ER COATED BEADS 120 MG PO CP24: 120.0000 mg | ORAL_CAPSULE | Freq: Every day | ORAL | Status: DC

## 2015-03-19 MED ORDER — METOPROLOL SUCCINATE ER 25 MG PO TB24: 12.5000 mg | ORAL_TABLET | Freq: Every day | ORAL | Status: DC

## 2015-03-19 MED ORDER — FLECAINIDE ACETATE 150 MG PO TABS: 225.0000 mg | ORAL_TABLET | Freq: Two times a day (BID) | ORAL | Status: DC

## 2015-03-19 NOTE — Assessment & Plan Note (Signed)
Pt recently hospitalized overnight for chest pain, instructed to f/u with his cardiologist.

## 2015-03-19 NOTE — Patient Instructions (Signed)
Medication Instructions:  Your physician recommends that you continue on your current medications as directed. Please refer to the Current Medication list given to you today.   Labwork: none  Testing/Procedures: none  Follow-Up: Your physician recommends that you schedule a follow-up appointment in: 6 months with Dr. [name].   Any Other Special Instructions Will Be Listed Below (If Applicable).     If you need a refill on your cardiac medications before your next appointment, please call your pharmacy.

## 2015-03-19 NOTE — Assessment & Plan Note (Signed)
CHADS VASC=6 

## 2015-03-19 NOTE — Assessment & Plan Note (Signed)
Controlled.  

## 2015-03-19 NOTE — Progress Notes (Signed)
03/19/2015 [name]   [birth year]  [id number]  Primary Physician [name], MD Primary Cardiologist: Dr [name]  HPI:  74 y.o.Caucasian male with a history of mildCAD (20% LAD) by cath in 2012, low risk Myoview in April 2015, s/p flutter ablation May 2012 followed by DCCV Sept 2012 for PAF. He has maintained NSR on BB, CCB & flecainide.  He is on chronic Coumadin followed in our office. He has had multiple ED visits for chest pain. He was seen in the ED at [facility] 03/10/15 for localized Lt lateral chest pain. Troponin's were negative x 3. Echo done showed normal LVF- EF 50-55%. He was instructed to f/u with cardiologist. He has not had recurrent pain and denies any palpitations or chest discomfort suggestive of angina.    Current Outpatient Prescriptions  Medication Sig Dispense Refill  . acetaminophen (TYLENOL) 500 MG tablet Take 1 tablet (500 mg total) by mouth 2 (two) times daily. 30 tablet 0  . albuterol (PROVENTIL HFA;VENTOLIN HFA) 108 (90 BASE) MCG/ACT inhaler Inhale 2 puffs into the lungs every 4 (four) hours as needed for wheezing or shortness of breath. 1 Inhaler 0  . aspirin EC 81 MG tablet Take 81 mg by mouth daily.    [name] diltiazem (CARDIZEM CD) 120 MG 24 hr capsule Take 1 capsule (120 mg total) by mouth daily. 90 capsule 3  . ferrous sulfate 325 (65 FE) MG tablet Take 325 mg by mouth daily with breakfast.    . flecainide (TAMBOCOR) 150 MG tablet Take 1.5 tablets (225 mg total) by mouth 2 (two) times daily. 90 tablet 3  . HYDROcodone-acetaminophen (NORCO/VICODIN) 5-325 MG tablet Take 1 tablet by mouth as needed.    [name] LANTUS SOLOSTAR 100 UNIT/ML Solostar Pen Inject 4 Units as directed every 6 (six) hours.     [name] loratadine (CLARITIN) 10 MG tablet Take 10 mg by mouth 2 (two) times daily.     . metFORMIN (GLUCOPHAGE) 1000 MG tablet Take 1,000 mg by mouth 2 (two) times daily with a meal.     . metoprolol succinate (TOPROL XL) 25 MG 24 hr tablet Take 0.5 tablets (12.5 mg total)  by mouth daily. 45 tablet 3  . Multiple Vitamin (MULTIVITAMIN WITH MINERALS) TABS tablet Take 1 tablet by mouth daily.    . nitroGLYCERIN (NITROSTAT) 0.4 MG SL tablet Place 1 tablet (0.4 mg total) under the tongue every 5 (five) minutes x 3 doses as needed for chest pain. 25 tablet 9  . pantoprazole (PROTONIX) 40 MG tablet Take 1 tablet (40 mg total) by mouth daily. (Patient taking differently: Take 40 mg by mouth every evening. ) 30 tablet 1  . pravastatin (PRAVACHOL) 40 MG tablet TAKE 1 TABLET EVERY EVENING 90 tablet 3  . warfarin (COUMADIN) 5 MG tablet Starting on 03/12/2015: Take 7.5mg  on Monday, Wednesday, Friday, Saturday and Sunday  Take 5mg  on Tuesday and Thursday 150 tablet 1   No current facility-administered medications for this visit.    No Known Allergies  Social History   Social History  . Marital Status: Widowed    Spouse Name: [name]  . Number of Children: [name]  . Years of Education: [name]   Occupational History  . Not on file.   Social History Main Topics  . Smoking status: Current Every Day Smoker -- 0.50 packs/day for 55 years    Types: Cigarettes  . Smokeless tobacco: Never Used     Comment: He's been smoking between 0.5 and 2 ppd since age  19.  . Alcohol Use: 0.0 oz/week    0 Standard drinks or equivalent per week     Comment: "quit drinking in 1986"  . Drug Use: No  . Sexual Activity: No   Other Topics Concern  . Not on file   Social History Narrative   He is a widower, who recently moved to [place] back in 2011 to live closer to his daughter. He formerly lived in [place]. He is a father 44, grandfather, and great-grandfather of 81. Unfortunately as I mentioned he's gone back to smoking. Smoking about a half pack a day now. He is not very active, but does try to get outside and walk some. He does not drink alcohol.     Review of Systems: General: negative for chills, fever, night sweats or weight changes.  Cardiovascular: negative for chest pain,  dyspnea on exertion, edema, orthopnea, palpitations, paroxysmal nocturnal dyspnea or shortness of breath Dermatological: negative for rash Respiratory: negative for cough or wheezing Urologic: negative for hematuria Abdominal: negative for nausea, vomiting, diarrhea, bright red blood  per rectum, melena, or hematemesis He has PUD by endoscopy June 2016 Neurologic: negative for visual changes, syncope, or dizziness He has significant C-spine DJD by MRI All other systems reviewed and are otherwise negative except as noted above.    Blood pressure 135/70, pulse 77, height 5\' 6"  (1.676 m), weight 185 lb 6.4 oz (84.097 kg).  General appearance: alert, cooperative and no distress Lungs: clear to auscultation bilaterally Heart: regular rate and rhythm Skin: Skin color, texture, turgor normal. No rashes or lesions Neurologic: Grossly normal  EKG 12/14/165- NSR, !st degree AVB, QTc 465  Echo: 03/11/15 Study Conclusions  - Left ventricle: The cavity size was normal. Wall thickness was increased in a pattern of moderate LVH. Systolic function was normal. The estimated ejection fraction was in the range of 50% to 55%. Wall motion was normal; there were no regional wall motion abnormalities. - Mitral valve: Moderately calcified annulus. There was mild regurgitation. - Left atrium: The atrium was moderately dilated.   ASSESSMENT AND PLAN:   Chest pain with low risk for cardiac etiology Pt recently hospitalized overnight for chest pain, instructed to f/u with his cardiologist.  Coronary artery disease, non-occlusive 20% LAD in 2012, low risk Myoview April 2015  PAF (paroxysmal atrial fibrillation), maintaining SR Hx of prior RFA for flutter and DCCV for PAF. Maintaining NSR on Flecainide  Essential hypertension Controlled  Type 2 diabetes mellitus (HCC) On oral agents   Long term current use of anticoagulant therapy Coumadin- CHADS VASC=6   Type II or unspecified  type diabetes mellitus without mention of complication, uncontrolled On oral agents  Anticoagulation goal of INR 2 to 3, for PAF  CHADS VASC=6    PLAN  Reassurance. F/U with Dr harding 6 months. Meds refilled.   Kerin Ransom K PA-C 03/19/2015 8:18 AM

## 2015-03-19 NOTE — Assessment & Plan Note (Signed)
On oral agents 

## 2015-03-19 NOTE — Assessment & Plan Note (Signed)
20% LAD in 2012, low risk Myoview April 2015

## 2015-03-19 NOTE — Assessment & Plan Note (Signed)
Coumadin- CHADS VASC=6 

## 2015-03-19 NOTE — Assessment & Plan Note (Signed)
Hx of prior RFA for flutter and DCCV for PAF. Maintaining NSR on Flecainide

## 2015-03-21 IMAGING — CR DG CHEST 2V
2 series · 2 of 2 positions shown · non-contrast
Comparison: 09/06/2013 and 04/16/2013 and 05/29/2012

CLINICAL DATA: Chest pain.

EXAM:
CHEST  2 VIEW

[w chest pa]
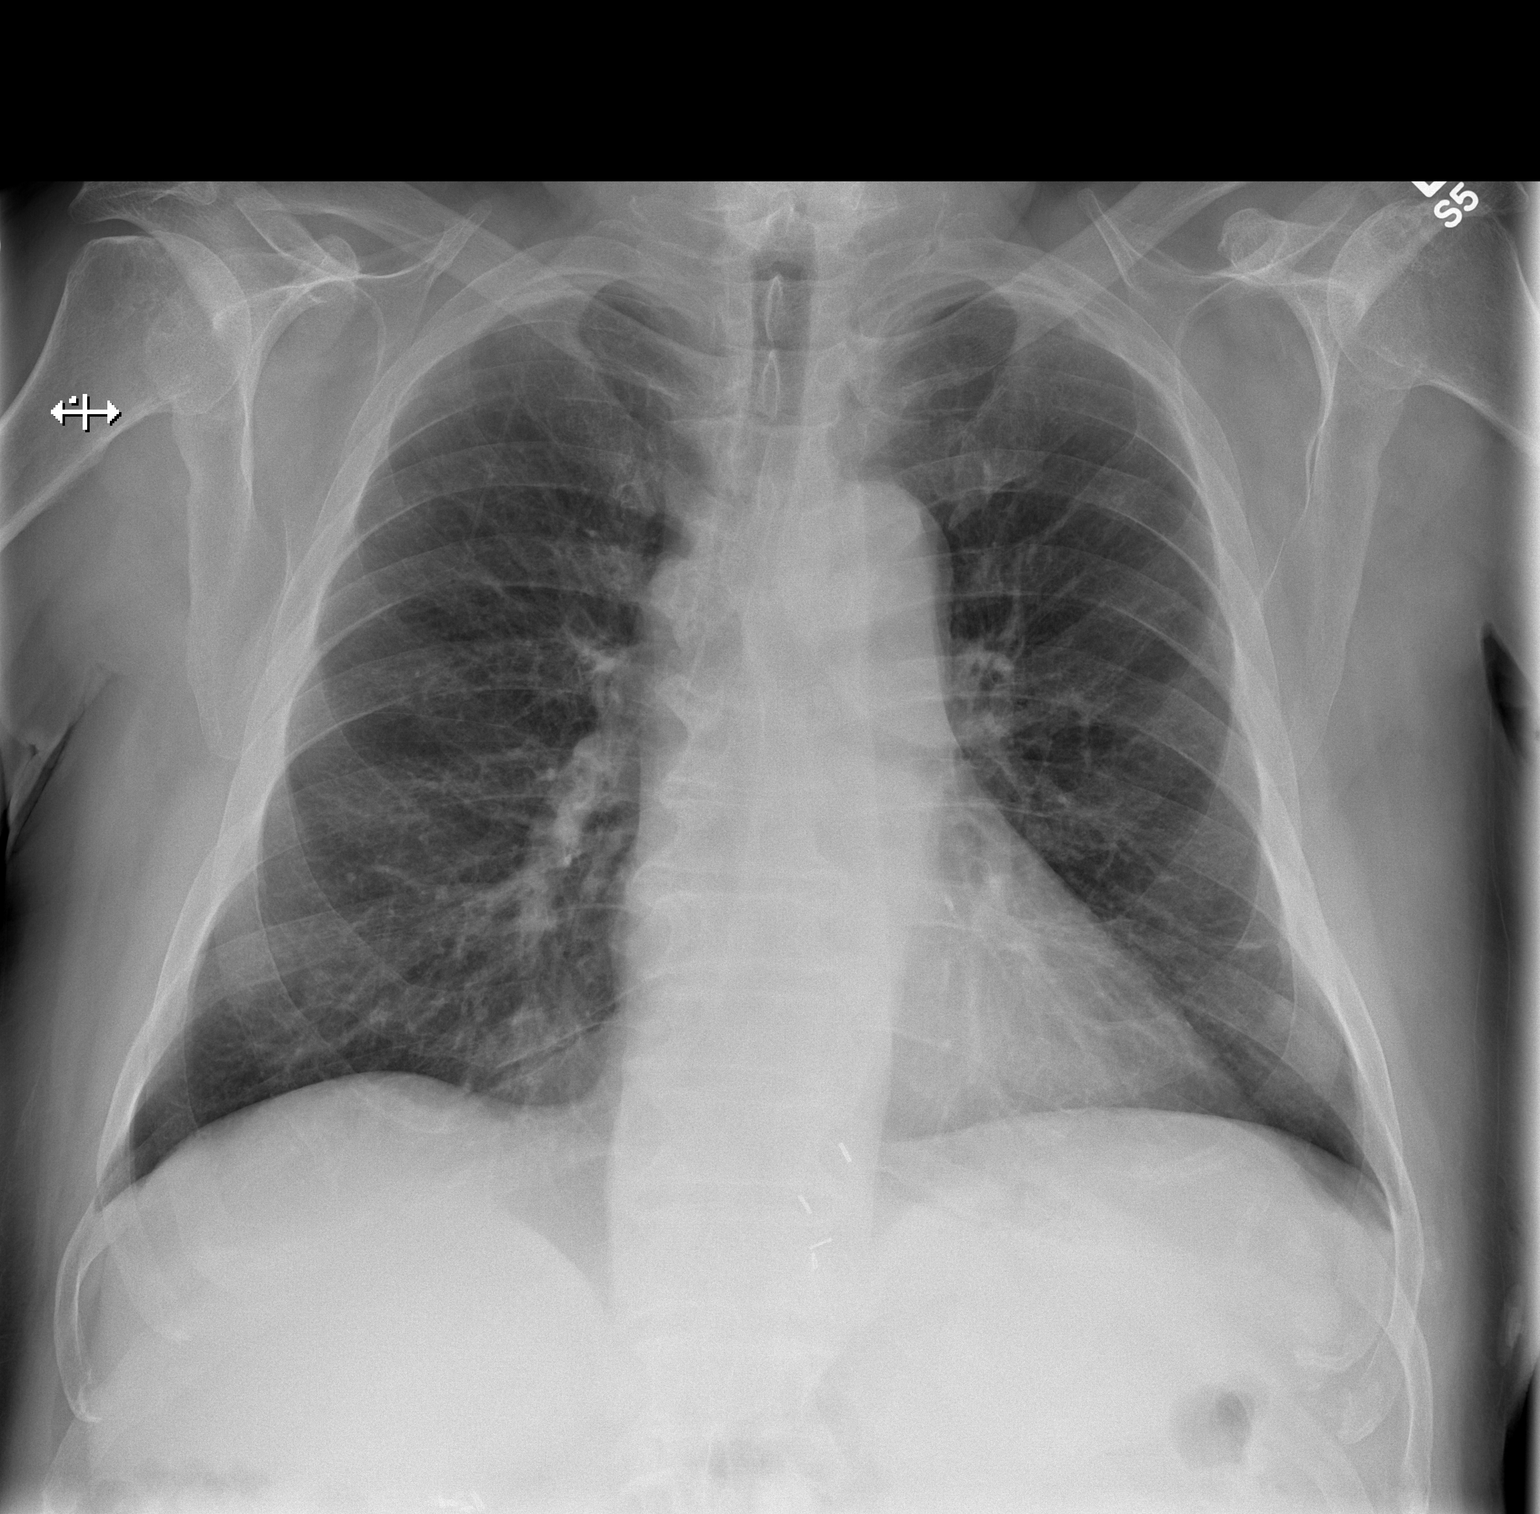

[w chest lat]
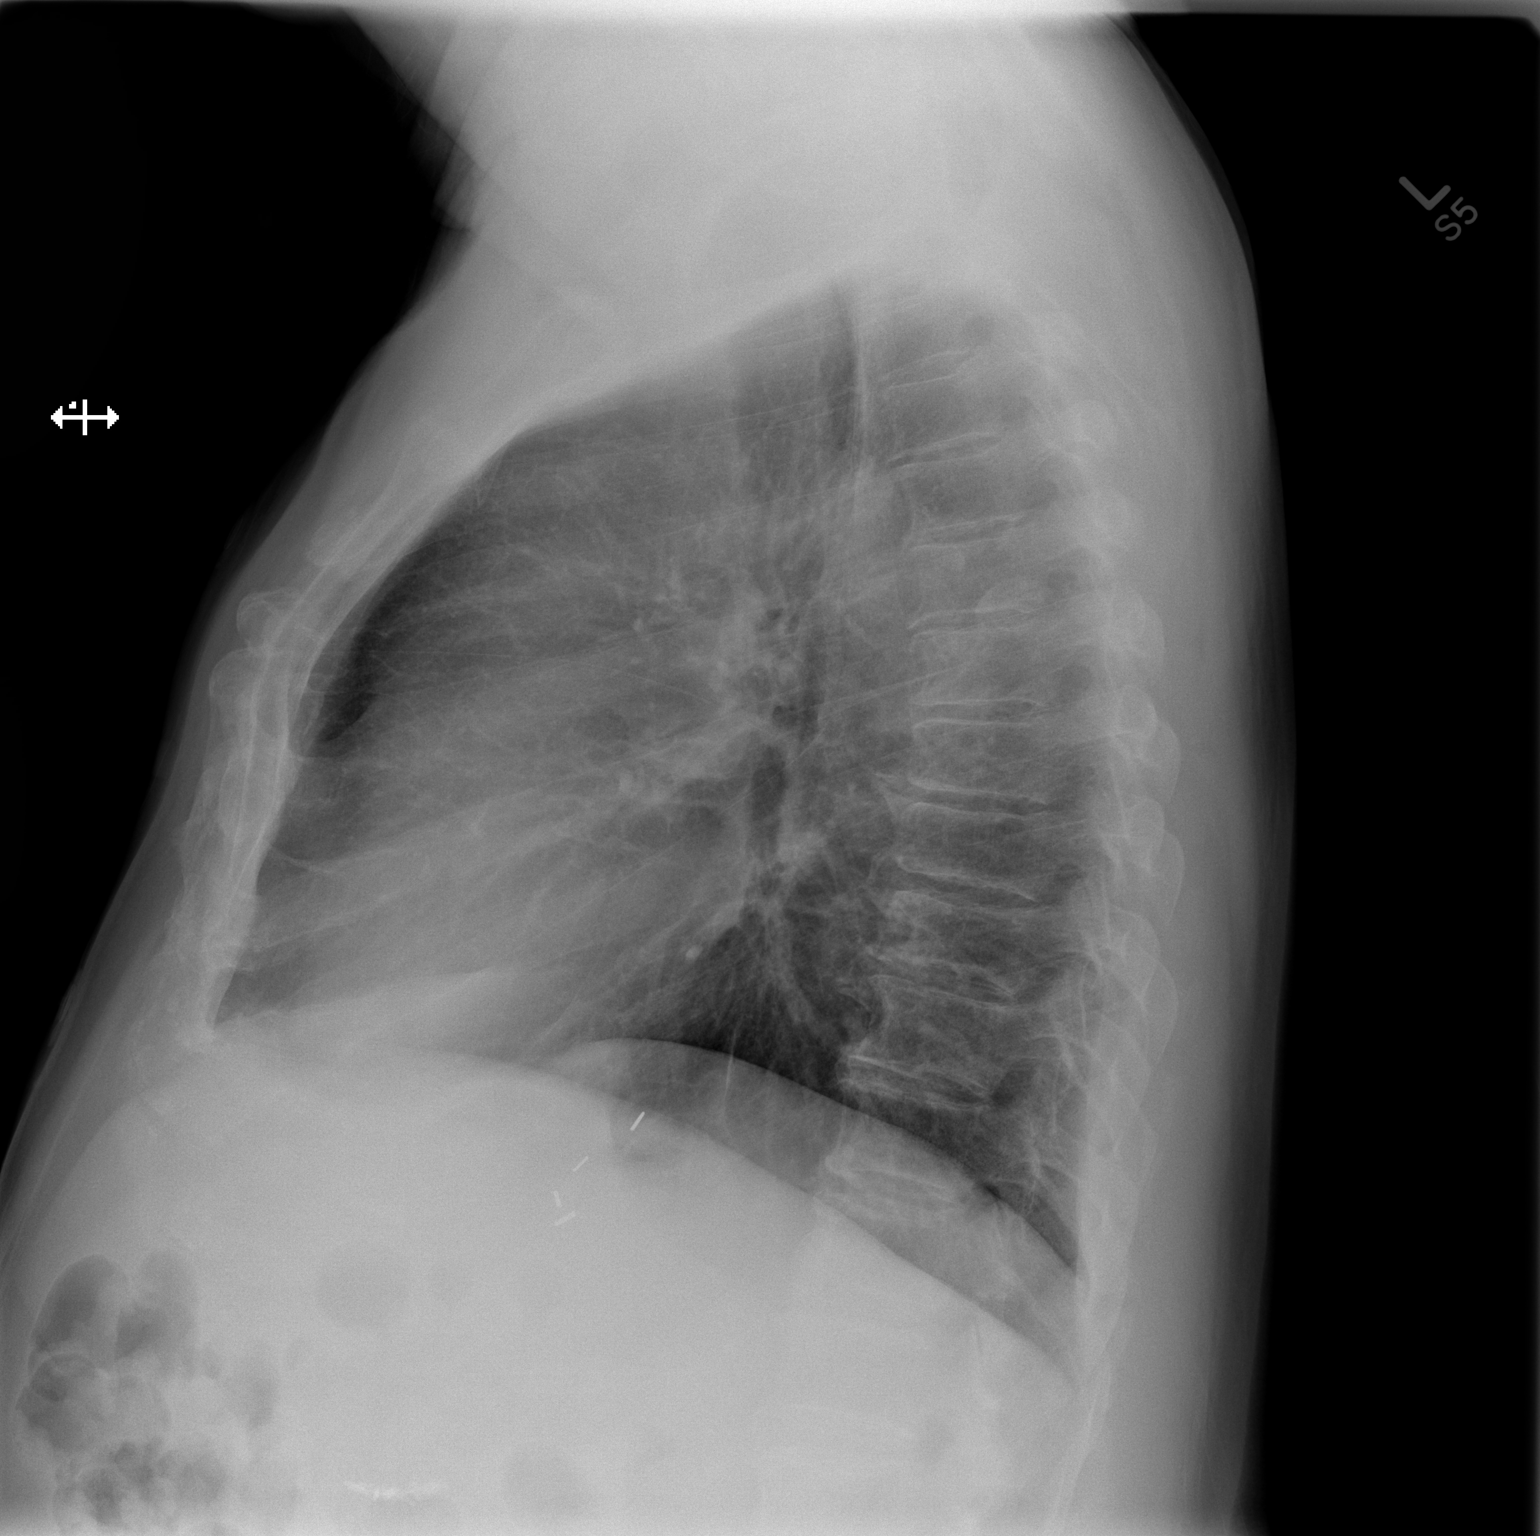

[2 of 2 positions shown; findings below may reference images not displayed]

FINDINGS: The heart size and mediastinal contours are within normal limits.
Both lungs are clear except for a tiny area of linear scarring at
the left base laterally. The visualized skeletal structures are
unremarkable.
IMPRESSION: No acute disease.

## 2015-04-06 ENCOUNTER — Other Ambulatory Visit: Payer: Self-pay | Admitting: Pharmacist Clinician (PhC)/ Clinical Pharmacy Specialist

## 2015-04-11 ENCOUNTER — Encounter (HOSPITAL_COMMUNITY): Payer: Self-pay

## 2015-04-11 ENCOUNTER — Emergency Department (HOSPITAL_COMMUNITY)
Admission: EM | Admit: 2015-04-11 | Discharge: 2015-04-11 | Disposition: A | Discharge status: Home / Self Care | Payer: Medicare Other | Attending: Emergency Medicine | Admitting: Emergency Medicine

## 2015-04-11 DIAGNOSIS — Z8719 Personal history of other diseases of the digestive system: Secondary | ICD-10-CM | POA: Diagnosis not present

## 2015-04-11 DIAGNOSIS — I2510 Atherosclerotic heart disease of native coronary artery without angina pectoris: Secondary | ICD-10-CM | POA: Diagnosis not present

## 2015-04-11 DIAGNOSIS — E119 Type 2 diabetes mellitus without complications: Secondary | ICD-10-CM | POA: Diagnosis not present

## 2015-04-11 DIAGNOSIS — M542 Cervicalgia: Secondary | ICD-10-CM | POA: Diagnosis present

## 2015-04-11 DIAGNOSIS — I480 Paroxysmal atrial fibrillation: Secondary | ICD-10-CM | POA: Insufficient documentation

## 2015-04-11 DIAGNOSIS — Z8701 Personal history of pneumonia (recurrent): Secondary | ICD-10-CM | POA: Diagnosis not present

## 2015-04-11 DIAGNOSIS — J449 Chronic obstructive pulmonary disease, unspecified: Secondary | ICD-10-CM | POA: Diagnosis not present

## 2015-04-11 DIAGNOSIS — M1990 Unspecified osteoarthritis, unspecified site: Secondary | ICD-10-CM | POA: Insufficient documentation

## 2015-04-11 DIAGNOSIS — F17210 Nicotine dependence, cigarettes, uncomplicated: Secondary | ICD-10-CM | POA: Insufficient documentation

## 2015-04-11 DIAGNOSIS — Z7982 Long term (current) use of aspirin: Secondary | ICD-10-CM | POA: Insufficient documentation

## 2015-04-11 DIAGNOSIS — Z9889 Other specified postprocedural states: Secondary | ICD-10-CM | POA: Diagnosis not present

## 2015-04-11 DIAGNOSIS — Z794 Long term (current) use of insulin: Secondary | ICD-10-CM | POA: Insufficient documentation

## 2015-04-11 DIAGNOSIS — I5032 Chronic diastolic (congestive) heart failure: Secondary | ICD-10-CM | POA: Insufficient documentation

## 2015-04-11 DIAGNOSIS — Z7901 Long term (current) use of anticoagulants: Secondary | ICD-10-CM | POA: Insufficient documentation

## 2015-04-11 DIAGNOSIS — D649 Anemia, unspecified: Secondary | ICD-10-CM | POA: Diagnosis not present

## 2015-04-11 DIAGNOSIS — G8929 Other chronic pain: Secondary | ICD-10-CM | POA: Diagnosis not present

## 2015-04-11 DIAGNOSIS — I4892 Unspecified atrial flutter: Secondary | ICD-10-CM | POA: Insufficient documentation

## 2015-04-11 DIAGNOSIS — Z7984 Long term (current) use of oral hypoglycemic drugs: Secondary | ICD-10-CM | POA: Diagnosis not present

## 2015-04-11 DIAGNOSIS — I10 Essential (primary) hypertension: Secondary | ICD-10-CM | POA: Diagnosis not present

## 2015-04-11 DIAGNOSIS — Z79899 Other long term (current) drug therapy: Secondary | ICD-10-CM | POA: Insufficient documentation

## 2015-04-11 MED ORDER — OXYCODONE-ACETAMINOPHEN 5-325 MG PO TABS
1.0000 | ORAL_TABLET | Freq: Once | ORAL | Status: AC
  Administered 2015-04-11: 1 via ORAL

## 2015-04-11 MED ORDER — OXYCODONE-ACETAMINOPHEN 5-325 MG PO TABS
ORAL_TABLET | ORAL | Status: AC
  Filled 2015-04-11: qty 1

## 2015-04-11 MED ORDER — OXYCODONE-ACETAMINOPHEN 5-325 MG PO TABS
1.0000 | ORAL_TABLET | Freq: Once | ORAL | Status: AC
  Administered 2015-04-11: 1 via ORAL
  Filled 2015-04-11: qty 1

## 2015-04-11 NOTE — ED Notes (Signed)
MD at bedside. 

## 2015-04-11 NOTE — ED Provider Notes (Signed)
CSN: [id number]     Arrival date & time 04/11/15  1951 History   First MD Initiated Contact with Patient 04/11/15 2030     Chief Complaint  Patient presents with  . Neck Pain     (Consider location/radiation/quality/duration/timing/severity/associated sxs/prior Treatment) HPI [name] is a 75 y.o. male who presents to the ED with neck pain that he reports has been bothering him for over a week. He denies any injury. Patient reports he has a ruptured disc in his neck and takes Vicodin for the pain. He reports that his PCP sent him to another doctor for his pain. He only get 20 Vicodin a month so he usually breaks them in half and tries to tolerate the pain. Today he took a Vicodin but his neck still hurts. He is here tonight to get something for pain until he can see his doctor.  He denies chest pain, n/v, abdominal pain or any other problems tonight.   Past Medical History  Diagnosis Date  . Atrial flutter ([place])     a. 07/2010 Status post caval tricuspid isthmus ablation by Dr. Midge Aver Metoprolol daily  . PAF (paroxysmal atrial fibrillation) ([place])     a. Recurrent after atrial flutter, currently controlled on flecainide plus diltiazem  . Hyperlipidemia     takes Pravastatin daily  . Diastolic CHF, chronic ([place])     a. 12/2012 EF 55-60%, diast dysfxn, triv MR, mildly dil LA/RA.  [name] Coronary artery disease, non-occlusive     a. 03/2010 Nonocclusive disease by cath, performed for ST elevations on ECG;  b. 06/2013 Lexi MV: EF 60%, no ischemia.  . Cervical radiculopathy due to degenerative joint disease of spine   . HTN (hypertension)     takes Diltiazem daily  . Dysrhythmia     HX OF ATRIAL IFB /FLUTTER takes Flecanide and Coumadin daily  . History of bronchitis     1998  . Weakness     numbness and tingling both hands  . Joint pain   . History of gastric ulcer   . Anemia     takes Ferrous Sulfate daily  . History of blood transfusion 1982    "when I had stomach OR"  .  Balance problem 01/2014  . Diabetes mellitus type II     takes Metformin and Lantus daily  . Pneumonia 1999  . CAP (community acquired pneumonia) 09/18/2014  . Arthritis     "all over"  . COPD (chronic obstructive pulmonary disease) [facility])    Past Surgical History  Procedure Laterality Date  . Atrial ablation surgery  08/05/10    CTI ablation for atrial flutter by JA  . Cardioversion  12/07/2010     Successful direct current cardioversion with atrial fibrillation to normal sinus rhythm  . Partial gastrectomy  1982    subtotal; "took out 30% for ulcers"  . Nm myoview ltd  07/22/2013    Normal EF ~60%, no ischemia or infarction.  . Colonoscopy [name] 12/02/2013    Procedure: COLONOSCOPY;  Surgeon: Irene Shipper, MD;  Location: [place];  Service: Endoscopy;  Laterality: [name];  . Cardiac catheterization  2012    nl LV function, no occlusive CAD, PAF  . Knee bursectomy Left 1998  . Incision and drainage abscess / hematoma of bursa / knee / thigh Left 1998    knee  . Cataract extraction w/ intraocular lens  implant, bilateral Bilateral   . Yag laser application Bilateral   . Carpal tunnel release Bilateral 01/30/2014  Procedure: BILATERAL CARPAL TUNNEL RELEASE;  Surgeon: Marianna Payment, MD;  Location: [place];  Service: Orthopedics;  Laterality: Bilateral;  . Transthoracic echocardiogram  02/16/2014    EF 60%, no RWMA. - otherwise normal  . Laparoscopic cholecystectomy  03/2010  . Esophagogastroduodenoscopy [name] 09/22/2014    Procedure: ESOPHAGOGASTRODUODENOSCOPY (EGD);  Surgeon: Ronald Lobo, MD;  Location: [facility] ENDOSCOPY;  Service: Endoscopy;  Laterality: [name];   Family History  Problem Relation Age of Onset  . Cancer Mother   . Heart attack Father   . Heart attack Brother    Social History  Substance Use Topics  . Smoking status: Current Every Day Smoker -- 0.50 packs/day for 55 years    Types: Cigarettes  . Smokeless tobacco: Never Used     Comment: He's been smoking between 0.5  and 2 ppd since age 69.  [name] Alcohol Use: 0.0 oz/week    0 Standard drinks or equivalent per week     Comment: "quit drinking in 1986"    Review of Systems  Negative except as stated in HPI  Allergies  Review of patient's allergies indicates no known allergies.  Home Medications   Prior to Admission medications   Medication Sig Start Date End Date Taking? Authorizing Provider  acetaminophen (TYLENOL) 500 MG tablet Take 1 tablet (500 mg total) by mouth 2 (two) times daily. 03/12/15   Domenic Moras, PA-C  albuterol (PROVENTIL HFA;VENTOLIN HFA) 108 (90 BASE) MCG/ACT inhaler Inhale 2 puffs into the lungs every 4 (four) hours as needed for wheezing or shortness of breath. 09/14/14   Sherwood Gambler, MD  aspirin EC 81 MG tablet Take 81 mg by mouth daily.    Historical Provider, MD  diltiazem (CARDIZEM CD) 120 MG 24 hr capsule Take 1 capsule (120 mg total) by mouth daily. 03/19/15   Erlene Quan, PA-C  ferrous sulfate 325 (65 FE) MG tablet Take 325 mg by mouth daily with breakfast.    Historical Provider, MD  flecainide (TAMBOCOR) 150 MG tablet Take 1.5 tablets (225 mg total) by mouth 2 (two) times daily. 03/19/15   Erlene Quan, PA-C  HYDROcodone-acetaminophen (NORCO/VICODIN) 5-325 MG tablet Take 1 tablet by mouth as needed. 02/27/15   Historical Provider, MD  LANTUS SOLOSTAR 100 UNIT/ML Solostar Pen Inject 4 Units as directed every 6 (six) hours.  08/31/14   Historical Provider, MD  loratadine (CLARITIN) 10 MG tablet Take 10 mg by mouth 2 (two) times daily.     Historical Provider, MD  metFORMIN (GLUCOPHAGE) 1000 MG tablet Take 1,000 mg by mouth 2 (two) times daily with a meal.     Historical Provider, MD  metoprolol succinate (TOPROL XL) 25 MG 24 hr tablet Take 0.5 tablets (12.5 mg total) by mouth daily. 03/19/15   Erlene Quan, PA-C  Multiple Vitamin (MULTIVITAMIN WITH MINERALS) TABS tablet Take 1 tablet by mouth daily.    Historical Provider, MD  nitroGLYCERIN (NITROSTAT) 0.4 MG SL tablet Place 1  tablet (0.4 mg total) under the tongue every 5 (five) minutes x 3 doses as needed for chest pain. 05/22/14   Leonie Man, MD  pantoprazole (PROTONIX) 40 MG tablet Take 1 tablet (40 mg total) by mouth daily. Patient taking differently: Take 40 mg by mouth every evening.  09/24/14   Thurnell Lose, MD  pravastatin (PRAVACHOL) 40 MG tablet TAKE 1 TABLET EVERY EVENING 10/26/14   Leonie Man, MD  warfarin (COUMADIN) 5 MG tablet TAKE 1 TO 1 AND 1/2 TABLETS DAILY AS  DIRECTED 04/06/15   Troy Sine, MD   BP 141/79 mmHg  Pulse 83  Temp(Src) 98 F (36.7 C) (Oral)  Resp 18  SpO2 98% Physical Exam  Constitutional: He is oriented to person, place, and time. He appears well-developed and well-nourished. No distress.  HENT:  Head: Normocephalic and atraumatic.  Eyes: Conjunctivae and EOM are normal. Pupils are equal, round, and reactive to light.  Neck: Trachea normal and normal range of motion. Neck supple. Spinous process tenderness and muscular tenderness present.  Cardiovascular: Normal rate and regular rhythm.   Pulmonary/Chest: Effort normal and breath sounds normal.  Abdominal: Soft. There is no tenderness.  Musculoskeletal: Normal range of motion.  Radial pulses 2+, adequate circulation, equal grips.  Neurological: He is alert and oriented to person, place, and time. He has normal strength. No cranial nerve deficit or sensory deficit. Gait normal.  Skin: Skin is warm and dry.  Psychiatric: He has a normal mood and affect. His behavior is normal.  Nursing note and vitals reviewed.   ED Course Dr. Billy Fischer in to examine the patient and discuss plan of care.  Will treat patent for his pain now but he will need to see his pain management doctor tomorrow for follow up. Patient agrees with plan.   Procedures MDM  75 y.o. male with chronic neck pain and hx of ruptured disc stable for d/c without focal neuro deficits. Patient treated with Percocet for pain her in the ED and reports  improvement after medication.  Discussed with the patient and all questioned fully answered. He will return if any problems arise.   Final diagnoses:  Chronic neck pain       Ashley Murrain, NP 04/11/15 2130  Gareth Morgan, MD 04/12/15 [phone]

## 2015-04-11 NOTE — ED Notes (Signed)
Pt states his neck has been bothering him since before the snow hit. Denies any fall or injury. States it hurts when he moves.

## 2015-04-16 ENCOUNTER — Ambulatory Visit (INDEPENDENT_AMBULATORY_CARE_PROVIDER_SITE_OTHER): Payer: Medicare Other | Admitting: Pharmacist Clinician (PhC)/ Clinical Pharmacy Specialist

## 2015-04-16 DIAGNOSIS — I480 Paroxysmal atrial fibrillation: Secondary | ICD-10-CM

## 2015-04-16 DIAGNOSIS — Z7901 Long term (current) use of anticoagulants: Secondary | ICD-10-CM | POA: Diagnosis not present

## 2015-04-16 LAB — POCT INR: INR: 2.9

## 2015-04-26 ENCOUNTER — Telehealth: Payer: Self-pay | Admitting: Cardiology

## 2015-04-26 MED ORDER — METOPROLOL SUCCINATE ER 25 MG PO TB24: 12.5000 mg | ORAL_TABLET | Freq: Every day | ORAL | Status: DC

## 2015-04-26 NOTE — Telephone Encounter (Signed)
Refill sent.

## 2015-04-26 NOTE — Telephone Encounter (Signed)
Follow Up  Pt daughter called states that they will need the script for metoprolol faxed to [facility] home pharmacy.   Fax #: per pt she doesn't have it but the office should have it in the system

## 2015-04-28 IMAGING — CR DG CHEST 2V
2 series · 2 of 2 positions shown · non-contrast
Comparison: 01/08/2014

CLINICAL DATA: Chest pain beginning 6 weeks ago with weakness.
Fainting spells beginning several months ago.

EXAM:
CHEST  2 VIEW

[chest pa]
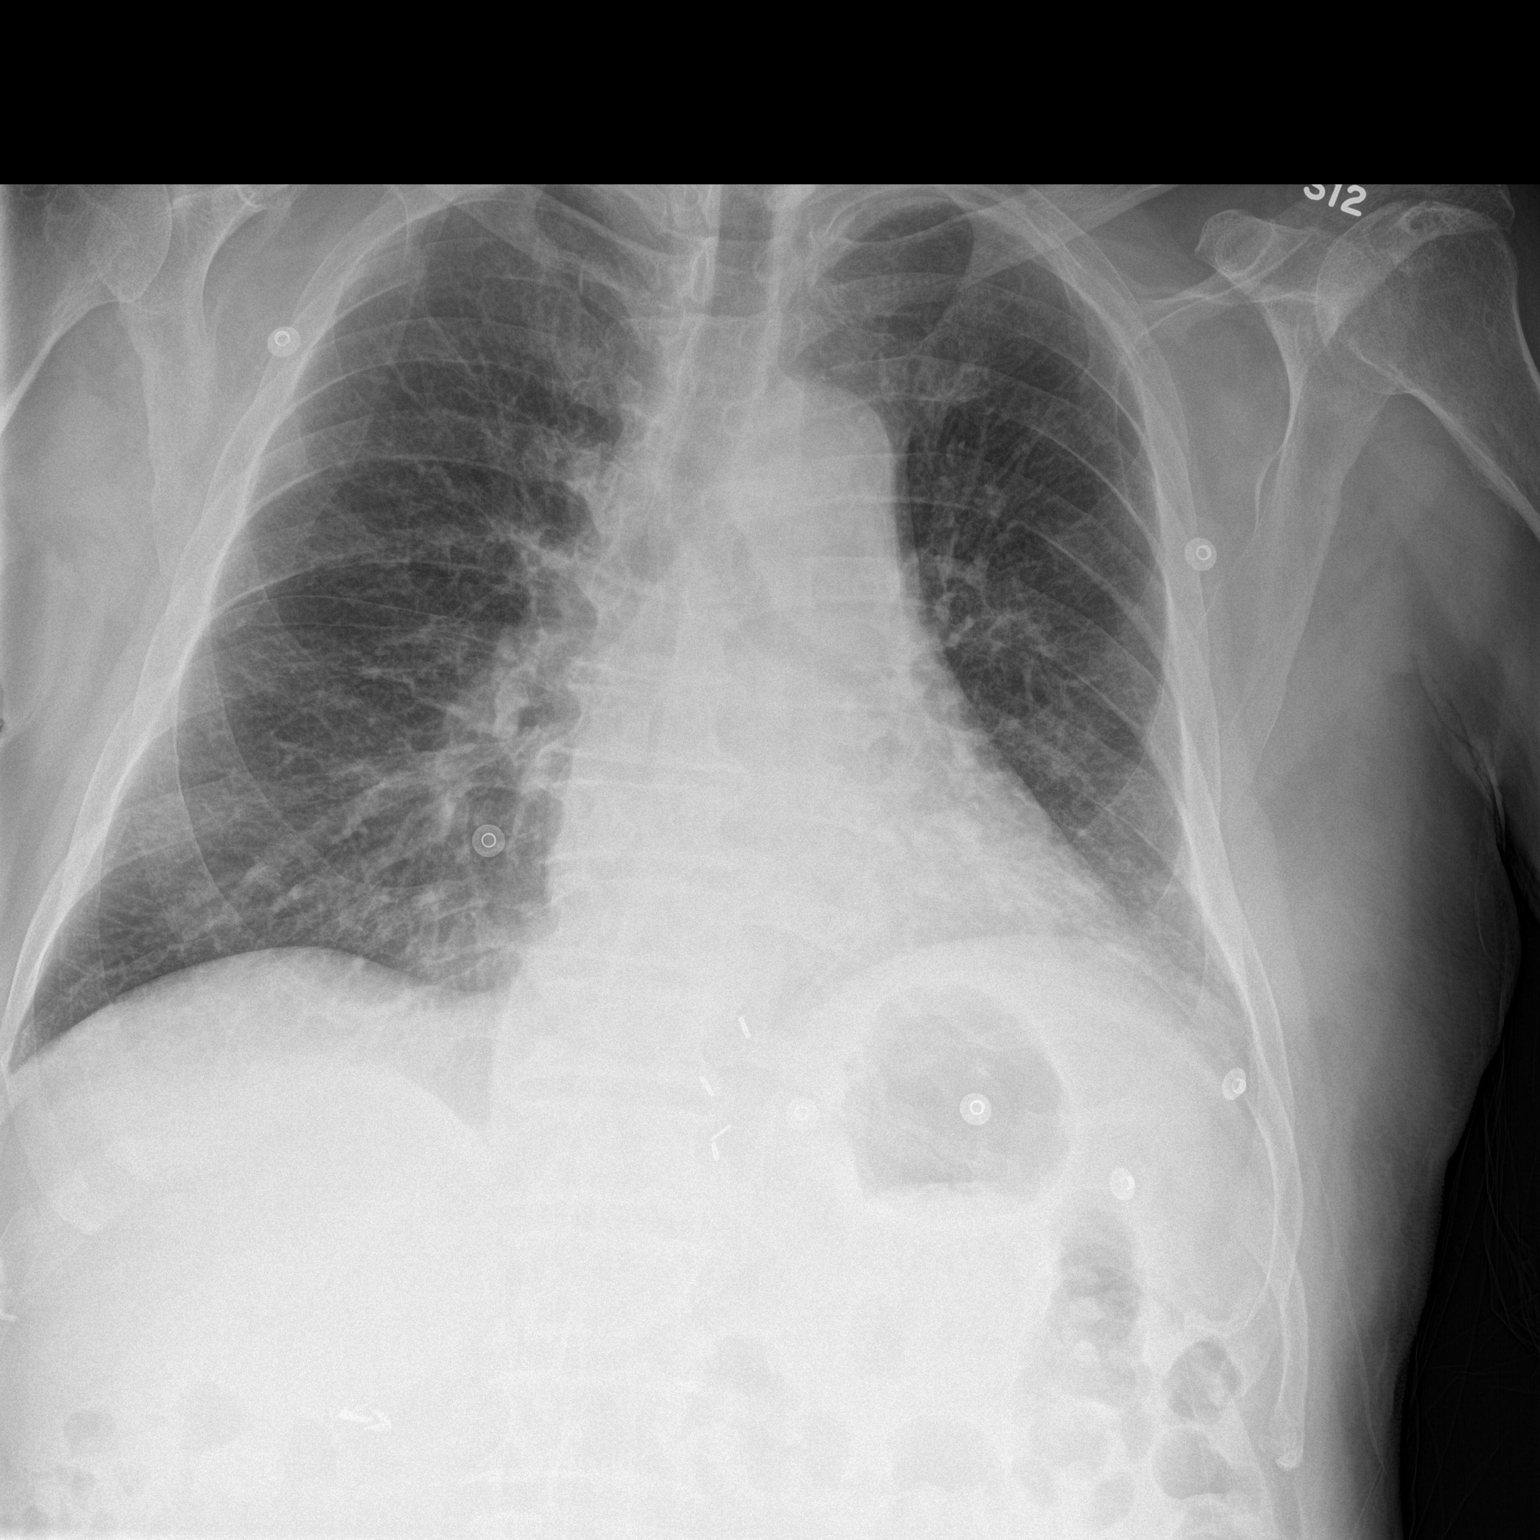

[chest lat]
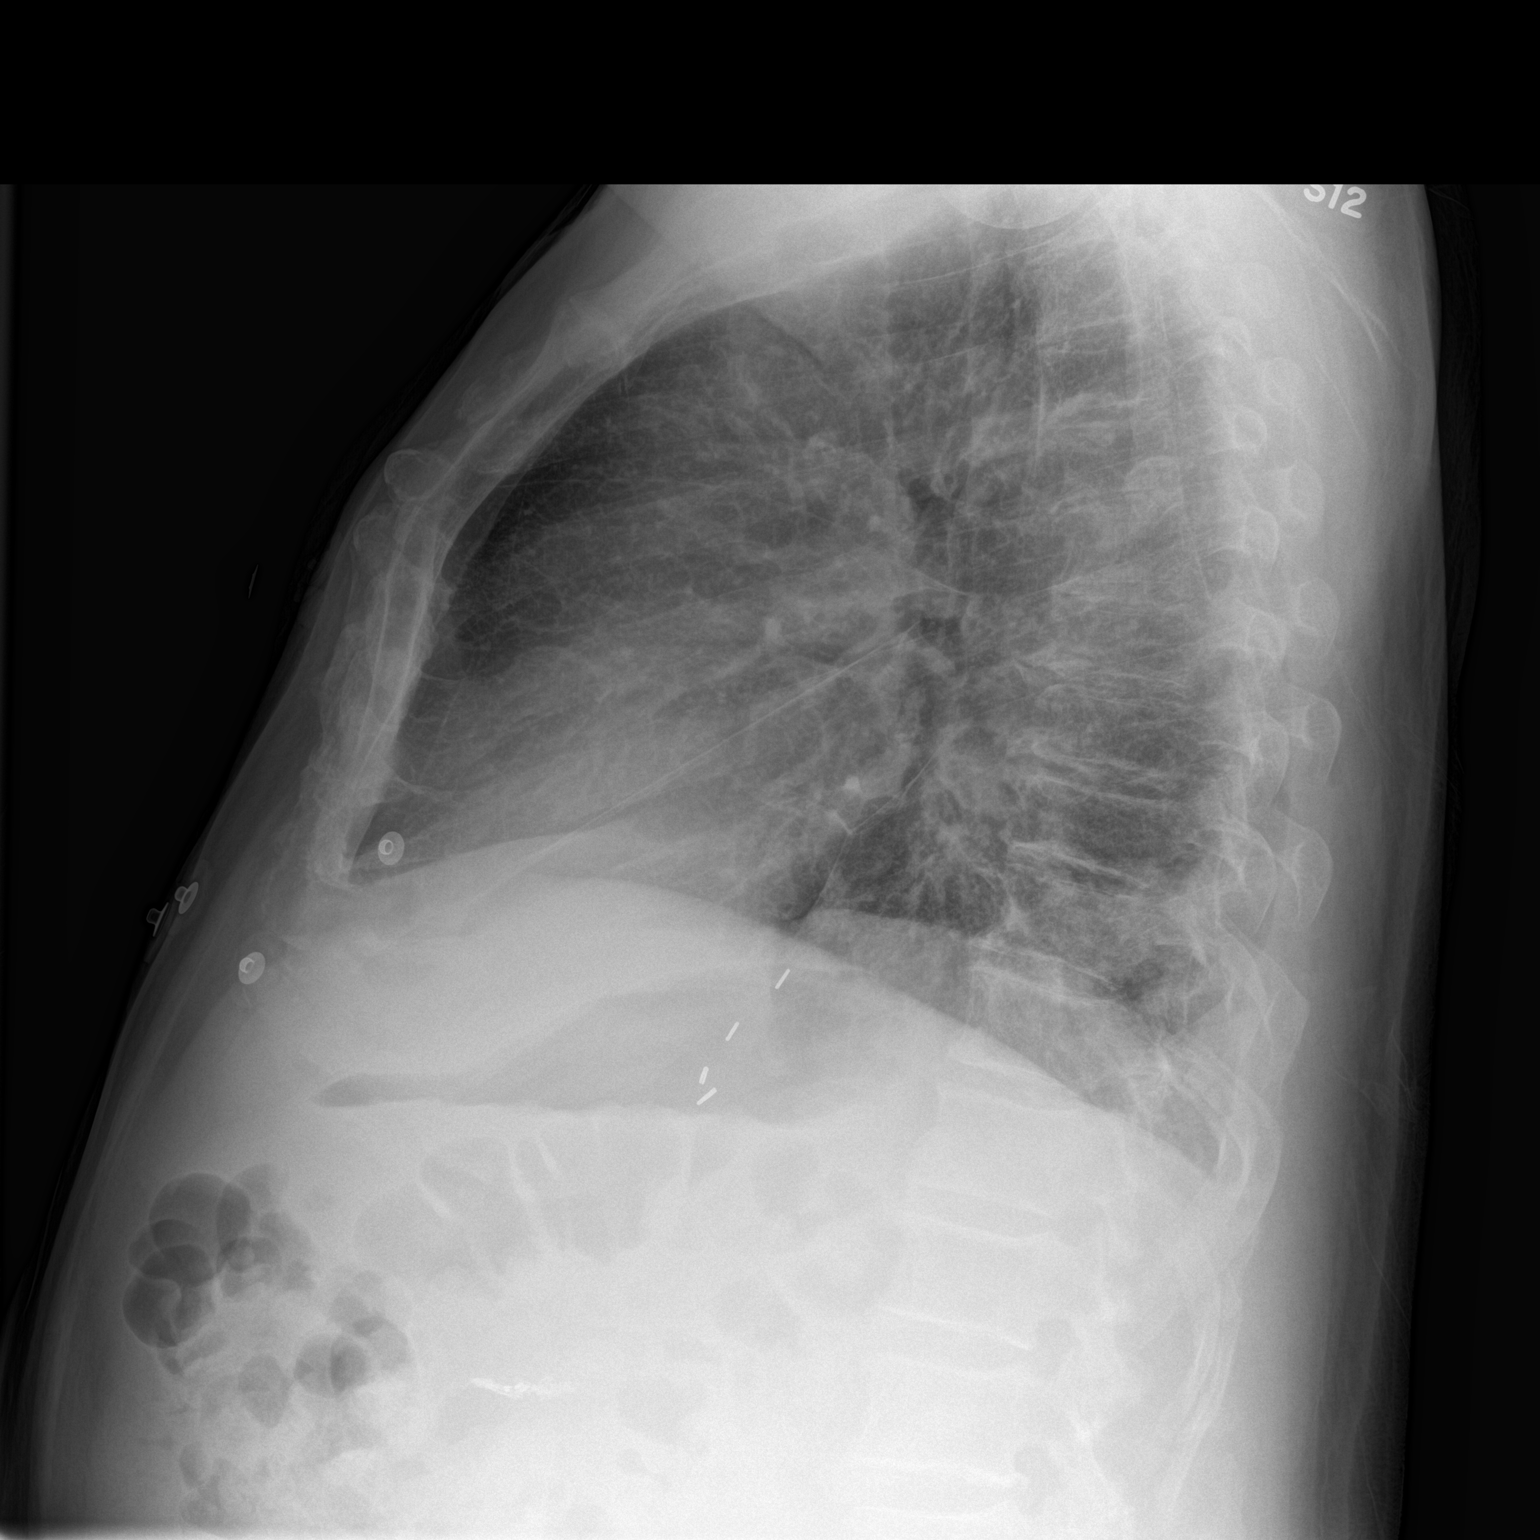

[2 of 2 positions shown; findings below may reference images not displayed]

FINDINGS: The lungs are adequately inflated No evidence of effusion or
pneumothorax. There is subtle increased density in the left
retrocardiac region. Cardiomediastinal silhouette and remainder of
the exam is unchanged.
IMPRESSION: Subtle increased density in the left retrocardiac region which may
be due to vascular crowding although cannot exclude atelectasis or
early infection.

## 2015-04-28 IMAGING — CT CT HEAD W/O CM
2 series · 16 of 30 positions shown, 18 images · non-contrast
Comparison: None.

CLINICAL DATA: Recent episodes of dizziness, vertigo and gait
instability.

EXAM:
CT HEAD WITHOUT CONTRAST
TECHNIQUE: Contiguous axial images were obtained from the base of the skull
through the vertex without intravenous contrast.

[Series 201: head w/o, idose (1) · axial · non-contrast · 0.44mm/px · z∈[+93,+218]mm · 8 of 33 slices shown, 10 images]
[im 4/33  brain]
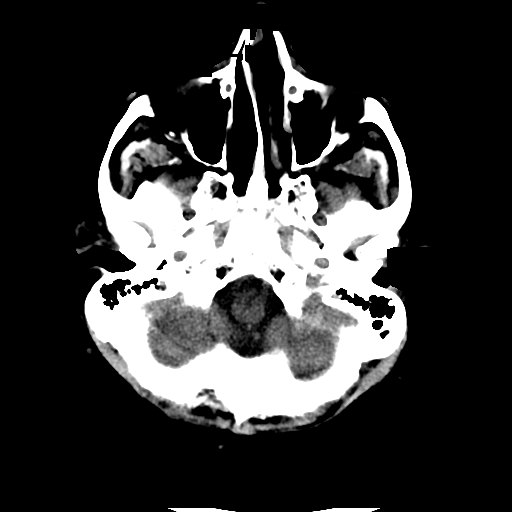
[im 4/33  bone]
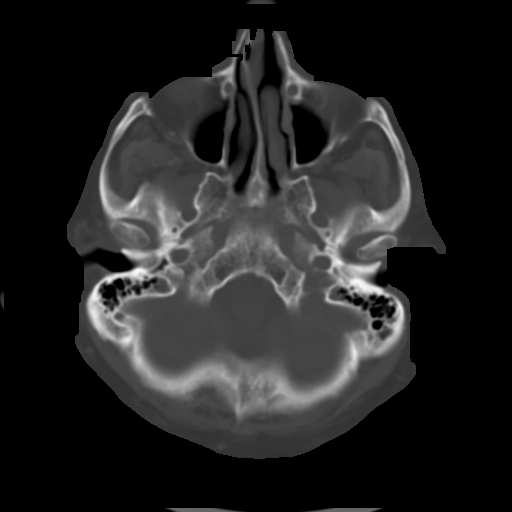
[im 8/33  brain]
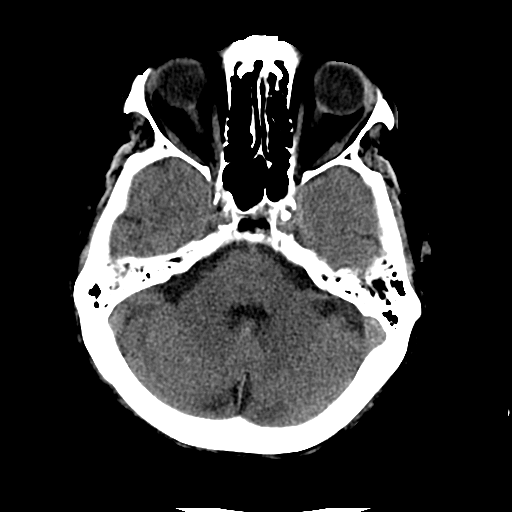
[im 11/33  brain]
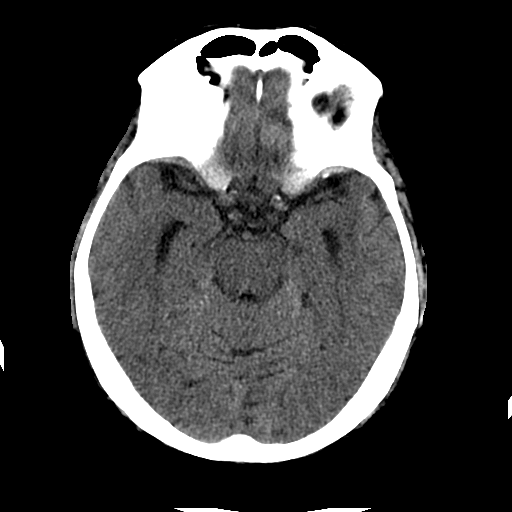
[im 15/33  brain]
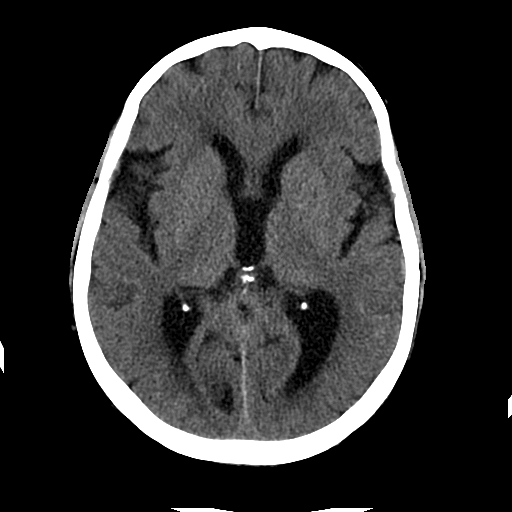
[im 18/33  brain]
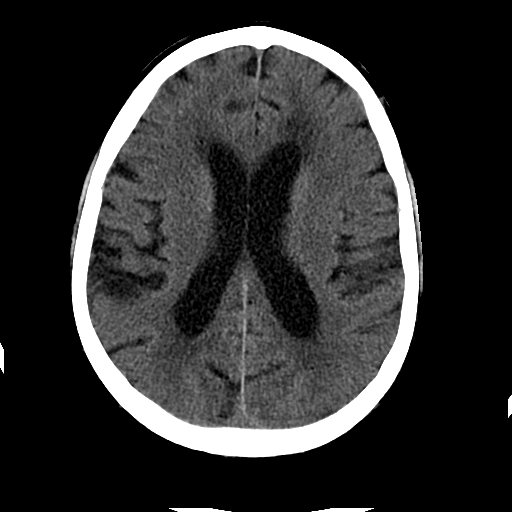
[im 18/33  bone]
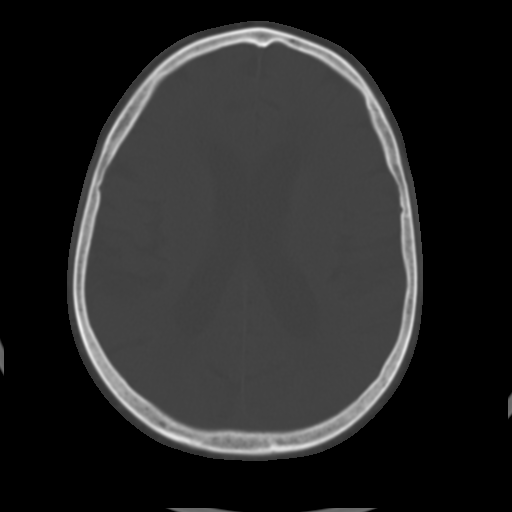
[im 22/33  brain]
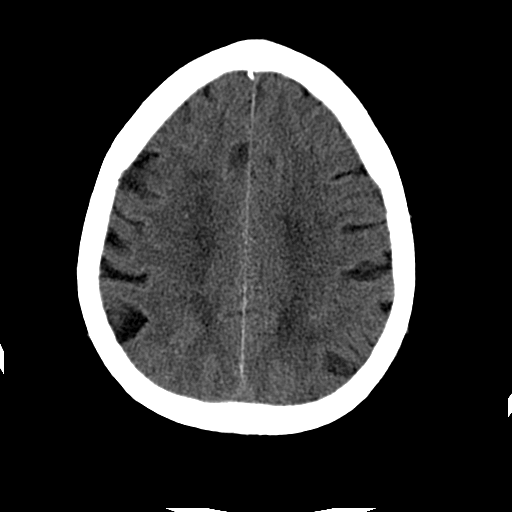
[im 25/33  brain]
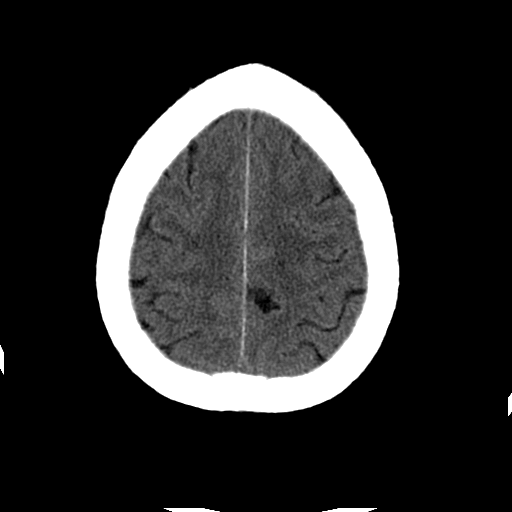
[im 29/33  brain]
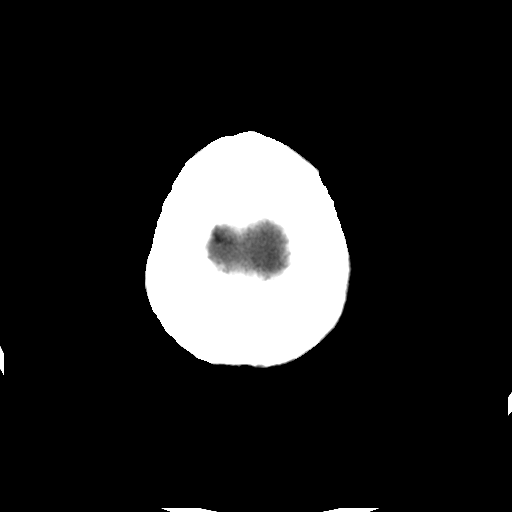

[Series 202: head w/o bone, idose (1) · axial · non-contrast · 0.44mm/px · z∈[+92,+222]mm · 8 of 66 slices shown]
[im 7/66  bone]
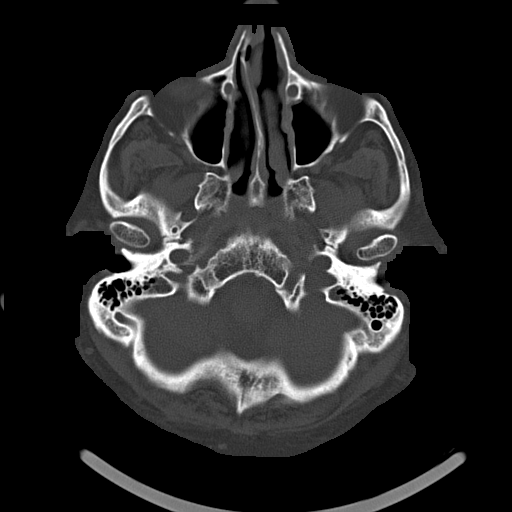
[im 14/66  bone]
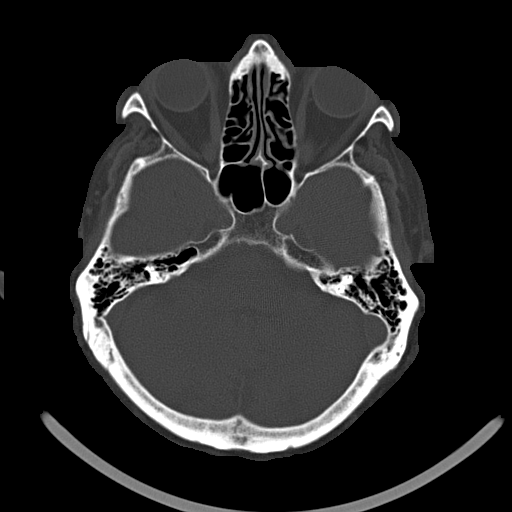
[im 21/66  bone]
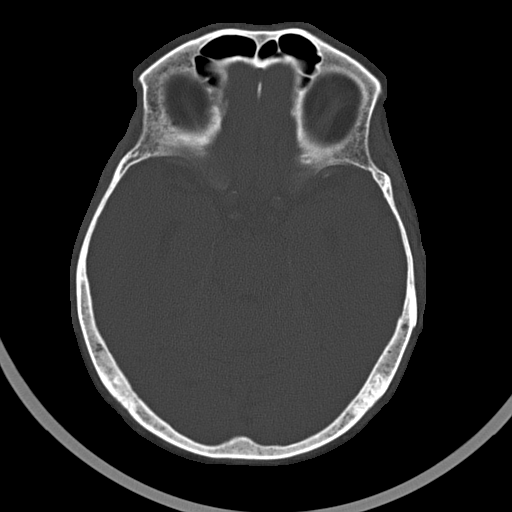
[im 28/66  bone]
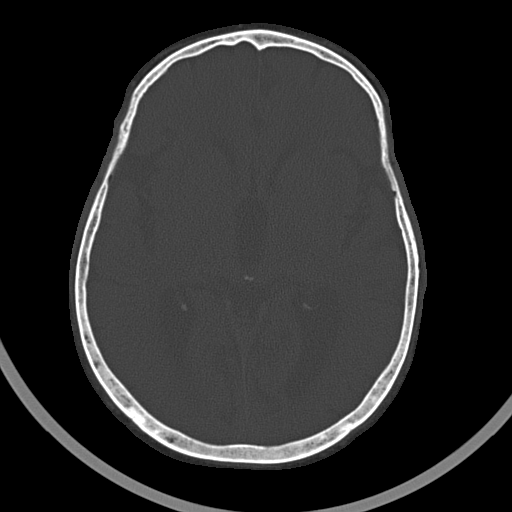
[im 38/66  bone]
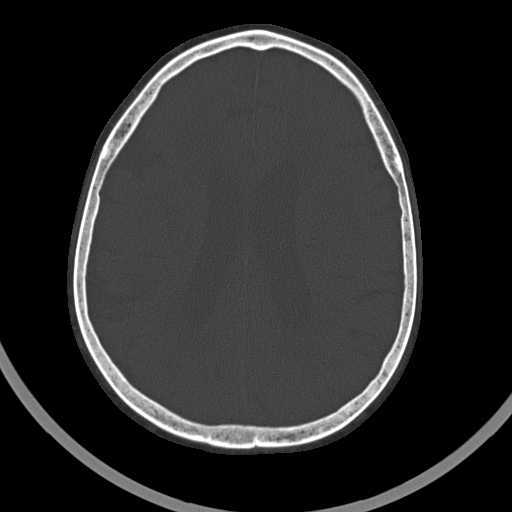
[im 45/66  bone]
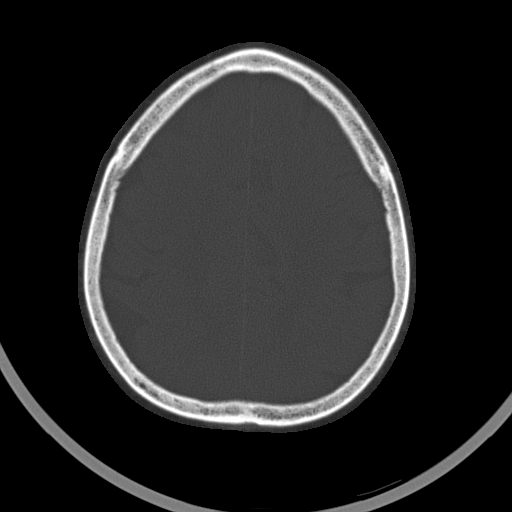
[im 52/66  bone]
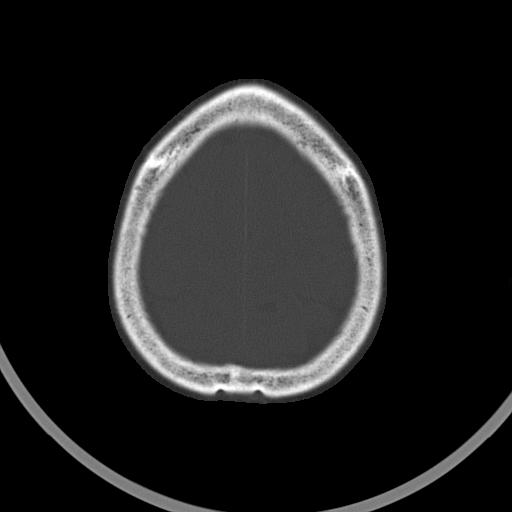
[im 59/66  bone]
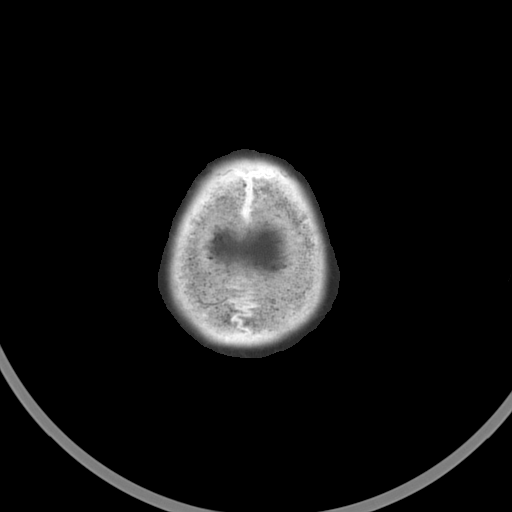

[16 of 30 positions shown; findings below may reference images not displayed]

FINDINGS: Moderately advanced small vessel ischemic changes are identified in
the periventricular white matter. There are old lacunar infarcts
involving the right caudate nucleus, right centrum semiovale and
white matter near the posterior vertex. Mild cortical atrophy
present. The brain demonstrates no evidence of hemorrhage, acute
infarction, edema, mass effect, extra-axial fluid collection,
hydrocephalus or mass lesion. The skull is unremarkable.
IMPRESSION: Advanced small vessel disease with old lacunar infarcts, as above.
There are no acute findings identified by head CT.

## 2015-05-03 ENCOUNTER — Encounter (HOSPITAL_COMMUNITY): Payer: Self-pay | Admitting: Cardiology

## 2015-05-03 ENCOUNTER — Emergency Department (HOSPITAL_COMMUNITY)
Admission: EM | Admit: 2015-05-03 | Discharge: 2015-05-03 | Disposition: A | Discharge status: Home / Self Care | Payer: Medicare Other | Source: Home / Self Care

## 2015-05-03 ENCOUNTER — Emergency Department (HOSPITAL_COMMUNITY)
Admission: EM | Admit: 2015-05-03 | Discharge: 2015-05-04 | Discharge status: Against Medical Advice | Payer: Medicare Other | Attending: Emergency Medicine | Admitting: Emergency Medicine

## 2015-05-03 ENCOUNTER — Encounter (HOSPITAL_COMMUNITY): Payer: Self-pay | Admitting: Emergency Medicine

## 2015-05-03 DIAGNOSIS — F17210 Nicotine dependence, cigarettes, uncomplicated: Secondary | ICD-10-CM | POA: Insufficient documentation

## 2015-05-03 DIAGNOSIS — R51 Headache: Secondary | ICD-10-CM | POA: Insufficient documentation

## 2015-05-03 DIAGNOSIS — E119 Type 2 diabetes mellitus without complications: Secondary | ICD-10-CM | POA: Diagnosis not present

## 2015-05-03 DIAGNOSIS — J449 Chronic obstructive pulmonary disease, unspecified: Secondary | ICD-10-CM | POA: Insufficient documentation

## 2015-05-03 DIAGNOSIS — I10 Essential (primary) hypertension: Secondary | ICD-10-CM | POA: Diagnosis not present

## 2015-05-03 DIAGNOSIS — I5032 Chronic diastolic (congestive) heart failure: Secondary | ICD-10-CM | POA: Insufficient documentation

## 2015-05-03 DIAGNOSIS — M542 Cervicalgia: Secondary | ICD-10-CM | POA: Insufficient documentation

## 2015-05-03 DIAGNOSIS — I2510 Atherosclerotic heart disease of native coronary artery without angina pectoris: Secondary | ICD-10-CM | POA: Diagnosis not present

## 2015-05-03 LAB — CBG MONITORING, ED: Glucose-Capillary: 127 mg/dL — ABNORMAL HIGH (ref 65–99)

## 2015-05-03 NOTE — ED Notes (Signed)
Pt reports neck pain, and shoulder pain that started about 3 weeks ago. Also reports his CBG has been running high at home.

## 2015-05-03 NOTE — ED Notes (Addendum)
Pt states that he has had headache, neck pain and states that he thinks it hurts more when his blood sugar spikes. Hx of cervical degenerative disc. Alert and oriented. CBG 163.

## 2015-05-14 ENCOUNTER — Ambulatory Visit (INDEPENDENT_AMBULATORY_CARE_PROVIDER_SITE_OTHER): Payer: Medicare Other | Admitting: Pharmacist Clinician (PhC)/ Clinical Pharmacy Specialist

## 2015-05-14 DIAGNOSIS — Z7901 Long term (current) use of anticoagulants: Secondary | ICD-10-CM

## 2015-05-14 DIAGNOSIS — I480 Paroxysmal atrial fibrillation: Secondary | ICD-10-CM | POA: Diagnosis not present

## 2015-05-14 LAB — POCT INR: INR: 3

## 2015-05-15 ENCOUNTER — Emergency Department (HOSPITAL_COMMUNITY): Payer: Medicare Other

## 2015-05-15 ENCOUNTER — Encounter (HOSPITAL_COMMUNITY): Payer: Self-pay

## 2015-05-15 ENCOUNTER — Observation Stay (HOSPITAL_COMMUNITY)
Admission: EM | Admit: 2015-05-15 | Discharge: 2015-05-17 | Disposition: A | Discharge status: Home / Self Care | Payer: Medicare Other | Attending: Internal Medicine | Admitting: Internal Medicine

## 2015-05-15 DIAGNOSIS — I10 Essential (primary) hypertension: Secondary | ICD-10-CM | POA: Insufficient documentation

## 2015-05-15 DIAGNOSIS — F17210 Nicotine dependence, cigarettes, uncomplicated: Secondary | ICD-10-CM | POA: Insufficient documentation

## 2015-05-15 DIAGNOSIS — Z7982 Long term (current) use of aspirin: Secondary | ICD-10-CM | POA: Diagnosis not present

## 2015-05-15 DIAGNOSIS — E119 Type 2 diabetes mellitus without complications: Secondary | ICD-10-CM | POA: Diagnosis not present

## 2015-05-15 DIAGNOSIS — M47812 Spondylosis without myelopathy or radiculopathy, cervical region: Secondary | ICD-10-CM | POA: Diagnosis not present

## 2015-05-15 DIAGNOSIS — Z79899 Other long term (current) drug therapy: Secondary | ICD-10-CM | POA: Diagnosis not present

## 2015-05-15 DIAGNOSIS — M5090 Cervical disc disorder, unspecified, unspecified cervical region: Secondary | ICD-10-CM | POA: Diagnosis not present

## 2015-05-15 DIAGNOSIS — R55 Syncope and collapse: Secondary | ICD-10-CM | POA: Diagnosis not present

## 2015-05-15 DIAGNOSIS — I2510 Atherosclerotic heart disease of native coronary artery without angina pectoris: Secondary | ICD-10-CM | POA: Insufficient documentation

## 2015-05-15 DIAGNOSIS — E785 Hyperlipidemia, unspecified: Secondary | ICD-10-CM | POA: Diagnosis not present

## 2015-05-15 DIAGNOSIS — I480 Paroxysmal atrial fibrillation: Secondary | ICD-10-CM | POA: Diagnosis present

## 2015-05-15 DIAGNOSIS — Z8673 Personal history of transient ischemic attack (TIA), and cerebral infarction without residual deficits: Secondary | ICD-10-CM

## 2015-05-15 DIAGNOSIS — I499 Cardiac arrhythmia, unspecified: Secondary | ICD-10-CM | POA: Diagnosis not present

## 2015-05-15 DIAGNOSIS — I25118 Atherosclerotic heart disease of native coronary artery with other forms of angina pectoris: Secondary | ICD-10-CM | POA: Diagnosis present

## 2015-05-15 DIAGNOSIS — I4892 Unspecified atrial flutter: Secondary | ICD-10-CM | POA: Insufficient documentation

## 2015-05-15 DIAGNOSIS — M1990 Unspecified osteoarthritis, unspecified site: Secondary | ICD-10-CM | POA: Insufficient documentation

## 2015-05-15 DIAGNOSIS — D649 Anemia, unspecified: Secondary | ICD-10-CM | POA: Diagnosis not present

## 2015-05-15 DIAGNOSIS — Z8701 Personal history of pneumonia (recurrent): Secondary | ICD-10-CM | POA: Insufficient documentation

## 2015-05-15 DIAGNOSIS — I5032 Chronic diastolic (congestive) heart failure: Secondary | ICD-10-CM | POA: Insufficient documentation

## 2015-05-15 DIAGNOSIS — J441 Chronic obstructive pulmonary disease with (acute) exacerbation: Secondary | ICD-10-CM | POA: Insufficient documentation

## 2015-05-15 DIAGNOSIS — R8290 Unspecified abnormal findings in urine: Secondary | ICD-10-CM | POA: Diagnosis present

## 2015-05-15 LAB — COMPREHENSIVE METABOLIC PANEL
ALK PHOS: 68 U/L (ref 38–126)
ALT: 88 U/L — AB (ref 17–63)
AST: 32 U/L (ref 15–41)
Albumin: 2.7 g/dL — ABNORMAL LOW (ref 3.5–5.0)
Anion gap: 11 (ref 5–15)
BUN: 16 mg/dL (ref 6–20)
CHLORIDE: 101 mmol/L (ref 101–111)
CO2: 23 mmol/L (ref 22–32)
CREATININE: 1 mg/dL (ref 0.61–1.24)
Calcium: 8.5 mg/dL — ABNORMAL LOW (ref 8.9–10.3)
GFR calc Af Amer: >60 mL/min (ref >60)
Glucose, Bld: 145 mg/dL — ABNORMAL HIGH (ref 65–99)
Potassium: 4 mmol/L (ref 3.5–5.1)
Sodium: 135 mmol/L (ref 135–145)
Total Bilirubin: 0.3 mg/dL (ref 0.3–1.2)
Total Protein: 5.3 g/dL — ABNORMAL LOW (ref 6.5–8.1)

## 2015-05-15 LAB — URINALYSIS, ROUTINE W REFLEX MICROSCOPIC
Bilirubin Urine: NEGATIVE
GLUCOSE, UA: NEGATIVE mg/dL
HGB URINE DIPSTICK: NEGATIVE
Ketones, ur: NEGATIVE mg/dL
Nitrite: NEGATIVE
PROTEIN: NEGATIVE mg/dL
Specific Gravity, Urine: 1.021 (ref 1.005–1.030)
pH: 5.5 (ref 5.0–8.0)

## 2015-05-15 LAB — URINE MICROSCOPIC-ADD ON
RBC / HPF: NONE SEEN RBC/hpf (ref 0–5)
SQUAMOUS EPITHELIAL / LPF: NONE SEEN

## 2015-05-15 LAB — PHOSPHORUS: PHOSPHORUS: 4 mg/dL (ref 2.5–4.6)

## 2015-05-15 LAB — CBC WITH DIFFERENTIAL/PLATELET
BASOS ABS: 0 10*3/uL (ref 0.0–0.1)
Basophils Relative: 0 %
Eosinophils Absolute: 0.2 10*3/uL (ref 0.0–0.7)
Eosinophils Relative: 2 %
HEMATOCRIT: 30.4 % — AB (ref 39.0–52.0)
HEMOGLOBIN: 9.3 g/dL — AB (ref 13.0–17.0)
LYMPHS PCT: 16 %
Lymphs Abs: 1.2 10*3/uL (ref 0.7–4.0)
MCH: 28.3 pg (ref 26.0–34.0)
MCHC: 30.6 g/dL (ref 30.0–36.0)
MCV: 92.4 fL (ref 78.0–100.0)
Monocytes Absolute: 0.7 10*3/uL (ref 0.1–1.0)
Monocytes Relative: 10 %
NEUTROS ABS: 5.5 10*3/uL (ref 1.7–7.7)
NEUTROS PCT: 72 %
PLATELETS: 267 10*3/uL (ref 150–400)
RBC: 3.29 MIL/uL — AB (ref 4.22–5.81)
RDW: 12.9 % (ref 11.5–15.5)
WBC: 7.6 10*3/uL (ref 4.0–10.5)

## 2015-05-15 LAB — CALCIUM: CALCIUM: 8.3 mg/dL — AB (ref 8.9–10.3)

## 2015-05-15 LAB — FERRITIN: FERRITIN: 9 ng/mL — AB (ref 24–336)

## 2015-05-15 LAB — CBG MONITORING, ED
GLUCOSE-CAPILLARY: 140 mg/dL — AB (ref 65–99)
Glucose-Capillary: 138 mg/dL — ABNORMAL HIGH (ref 65–99)

## 2015-05-15 LAB — IRON AND TIBC
IRON: 23 ug/dL — AB (ref 45–182)
Saturation Ratios: 6 % — ABNORMAL LOW (ref 17.9–39.5)
TIBC: 361 ug/dL (ref 250–450)
UIBC: 338 ug/dL

## 2015-05-15 LAB — MAGNESIUM: MAGNESIUM: 1.8 mg/dL (ref 1.7–2.4)

## 2015-05-15 LAB — TROPONIN I

## 2015-05-15 LAB — RETICULOCYTES
RBC.: 3.21 MIL/uL — ABNORMAL LOW (ref 4.22–5.81)
RETIC COUNT ABSOLUTE: 118.8 10*3/uL (ref 19.0–186.0)
Retic Ct Pct: 3.7 % — ABNORMAL HIGH (ref 0.4–3.1)

## 2015-05-15 LAB — PROTIME-INR
INR: 2.93 — ABNORMAL HIGH (ref 0.00–1.49)
Prothrombin Time: 30.1 seconds — ABNORMAL HIGH (ref 11.6–15.2)

## 2015-05-15 LAB — OCCULT BLOOD X 1 CARD TO LAB, STOOL: Fecal Occult Bld: POSITIVE — AB

## 2015-05-15 LAB — GLUCOSE, CAPILLARY: GLUCOSE-CAPILLARY: 172 mg/dL — AB (ref 65–99)

## 2015-05-15 LAB — CG4 I-STAT (LACTIC ACID): LACTIC ACID, VENOUS: 1.08 mmol/L (ref 0.5–2.0)

## 2015-05-15 LAB — FOLATE: FOLATE: 22.6 ng/mL (ref >5.9)

## 2015-05-15 LAB — VITAMIN B12: Vitamin B-12: 373 pg/mL (ref 180–914)

## 2015-05-15 LAB — I-STAT CG4 LACTIC ACID, ED: LACTIC ACID, VENOUS: 1.75 mmol/L (ref 0.5–2.0)

## 2015-05-15 MED ORDER — FLECAINIDE ACETATE 50 MG PO TABS
225.0000 mg | ORAL_TABLET | Freq: Two times a day (BID) | ORAL | Status: DC
  Administered 2015-05-15 – 2015-05-17 (×4): 225 mg via ORAL
  Filled 2015-05-15 (×5): qty 1

## 2015-05-15 MED ORDER — INSULIN ASPART 100 UNIT/ML ~~LOC~~ SOLN: 0.0000 [IU] | Freq: Every day | SUBCUTANEOUS | Status: DC

## 2015-05-15 MED ORDER — PRAVASTATIN SODIUM 40 MG PO TABS
40.0000 mg | ORAL_TABLET | Freq: Every evening | ORAL | Status: DC
  Administered 2015-05-15 – 2015-05-16 (×2): 40 mg via ORAL
  Filled 2015-05-15 (×2): qty 1

## 2015-05-15 MED ORDER — HYDROCODONE-ACETAMINOPHEN 5-325 MG PO TABS
0.5000 | ORAL_TABLET | Freq: Once | ORAL | Status: AC
  Administered 2015-05-15: 0.5 via ORAL
  Filled 2015-05-15: qty 1

## 2015-05-15 MED ORDER — LORATADINE 10 MG PO TABS
10.0000 mg | ORAL_TABLET | Freq: Every evening | ORAL | Status: DC
  Administered 2015-05-15 – 2015-05-16 (×2): 10 mg via ORAL
  Filled 2015-05-15 (×2): qty 1

## 2015-05-15 MED ORDER — METOPROLOL SUCCINATE ER 25 MG PO TB24
12.5000 mg | ORAL_TABLET | Freq: Every evening | ORAL | Status: DC
  Administered 2015-05-15: 12.5 mg via ORAL
  Filled 2015-05-15: qty 1

## 2015-05-15 MED ORDER — TAMSULOSIN HCL 0.4 MG PO CAPS
0.4000 mg | ORAL_CAPSULE | ORAL | Status: DC
  Administered 2015-05-16 – 2015-05-17 (×2): 0.4 mg via ORAL
  Filled 2015-05-15 (×2): qty 1

## 2015-05-15 MED ORDER — FERROUS SULFATE 325 (65 FE) MG PO TABS
325.0000 mg | ORAL_TABLET | Freq: Every day | ORAL | Status: DC
Start: 2015-05-16 — End: 2015-05-17
  Administered 2015-05-16 – 2015-05-17 (×2): 325 mg via ORAL
  Filled 2015-05-15 (×2): qty 1

## 2015-05-15 MED ORDER — WARFARIN SODIUM 5 MG PO TABS
5.0000 mg | ORAL_TABLET | Freq: Once | ORAL | Status: AC
  Administered 2015-05-15: 5 mg via ORAL
  Filled 2015-05-15: qty 1

## 2015-05-15 MED ORDER — WARFARIN - PHARMACIST DOSING INPATIENT: Freq: Every day | Status: DC

## 2015-05-15 MED ORDER — SUCRALFATE 1 G PO TABS
1.0000 g | ORAL_TABLET | Freq: Three times a day (TID) | ORAL | Status: DC
  Administered 2015-05-15 – 2015-05-17 (×7): 1 g via ORAL
  Filled 2015-05-15 (×7): qty 1

## 2015-05-15 MED ORDER — ADULT MULTIVITAMIN W/MINERALS CH
1.0000 | ORAL_TABLET | Freq: Every evening | ORAL | Status: DC
  Administered 2015-05-16: 1 via ORAL
  Filled 2015-05-15: qty 1

## 2015-05-15 MED ORDER — SODIUM CHLORIDE 0.9 % IV BOLUS (SEPSIS)
1000.0000 mL | Freq: Once | INTRAVENOUS | Status: AC
  Administered 2015-05-15: 1000 mL via INTRAVENOUS

## 2015-05-15 MED ORDER — SODIUM CHLORIDE 0.9% FLUSH
3.0000 mL | Freq: Two times a day (BID) | INTRAVENOUS | Status: DC
Start: 2015-05-15 — End: 2015-05-17
  Administered 2015-05-16 – 2015-05-17 (×2): 3 mL via INTRAVENOUS

## 2015-05-15 MED ORDER — DILTIAZEM HCL ER COATED BEADS 120 MG PO CP24
120.0000 mg | ORAL_CAPSULE | Freq: Every day | ORAL | Status: DC
Start: 2015-05-16 — End: 2015-05-17
  Administered 2015-05-16 – 2015-05-17 (×2): 120 mg via ORAL
  Filled 2015-05-15 (×2): qty 1

## 2015-05-15 MED ORDER — METHOCARBAMOL 750 MG PO TABS
750.0000 mg | ORAL_TABLET | Freq: Four times a day (QID) | ORAL | Status: DC | PRN
  Administered 2015-05-15 – 2015-05-17 (×2): 750 mg via ORAL
  Filled 2015-05-15 (×2): qty 1

## 2015-05-15 MED ORDER — ASPIRIN EC 81 MG PO TBEC
81.0000 mg | DELAYED_RELEASE_TABLET | Freq: Every day | ORAL | Status: DC
  Administered 2015-05-16 – 2015-05-17 (×2): 81 mg via ORAL
  Filled 2015-05-15 (×2): qty 1

## 2015-05-15 MED ORDER — PANTOPRAZOLE SODIUM 40 MG PO TBEC
40.0000 mg | DELAYED_RELEASE_TABLET | Freq: Every evening | ORAL | Status: DC
  Administered 2015-05-15 – 2015-05-16 (×2): 40 mg via ORAL
  Filled 2015-05-15 (×2): qty 1

## 2015-05-15 MED ORDER — INSULIN ASPART 100 UNIT/ML ~~LOC~~ SOLN
0.0000 [IU] | Freq: Three times a day (TID) | SUBCUTANEOUS | Status: DC
  Administered 2015-05-16 – 2015-05-17 (×3): 2 [IU] via SUBCUTANEOUS

## 2015-05-15 NOTE — ED Provider Notes (Signed)
CSN: [id number]     Arrival date & time 05/15/15  1208 History   First MD Initiated Contact with Patient 05/15/15 1215     No chief complaint on file.    (Consider location/radiation/quality/duration/timing/severity/associated sxs/prior Treatment) The history is provided by the patient and medical records. No language interpreter was used.   [name] is a 75 y.o. male  with a PMH of afib, aflutter, CHF, HTN, HLD, COPD, and cervical DJD who presents to the Emergency Department for syncope witnessed by daughter lasting 1-2 minutes while sitting in the passenger seat of vehicle just PTA. [name] Associated symptoms include generalized weakness. No prodromal symptoms, no post-ictal symptoms. Denies chest pain, headache, head injury, sob, incontinence. Patient is at baseline mental status upon arrival. Denies hx of similar sxs. No treatment for symptoms prior to arrival. Admits to compliance with regular medications.    Past Medical History  Diagnosis Date  . Atrial flutter ([place])     a. 07/2010 Status post caval tricuspid isthmus ablation by Dr. Midge Aver Metoprolol daily  . PAF (paroxysmal atrial fibrillation) ([place])     a. Recurrent after atrial flutter, currently controlled on flecainide plus diltiazem  . Hyperlipidemia     takes Pravastatin daily  . Diastolic CHF, chronic ([place])     a. 12/2012 EF 55-60%, diast dysfxn, triv MR, mildly dil LA/RA.  [name] Coronary artery disease, non-occlusive     a. 03/2010 Nonocclusive disease by cath, performed for ST elevations on ECG;  b. 06/2013 Lexi MV: EF 60%, no ischemia.  . Cervical radiculopathy due to degenerative joint disease of spine   . HTN (hypertension)     takes Diltiazem daily  . Dysrhythmia     HX OF ATRIAL IFB /FLUTTER takes Flecanide and Coumadin daily  . History of bronchitis     1998  . Weakness     numbness and tingling both hands  . Joint pain   . History of gastric ulcer   . Anemia     takes Ferrous Sulfate daily  . History of  blood transfusion 1982    "when I had stomach OR"  . Balance problem 01/2014  . Diabetes mellitus type II     takes Metformin and Lantus daily  . Pneumonia 1999  . CAP (community acquired pneumonia) 09/18/2014  . Arthritis     "all over"  . COPD (chronic obstructive pulmonary disease) [facility])    Past Surgical History  Procedure Laterality Date  . Atrial ablation surgery  08/05/10    CTI ablation for atrial flutter by JA  . Cardioversion  12/07/2010     Successful direct current cardioversion with atrial fibrillation to normal sinus rhythm  . Partial gastrectomy  1982    subtotal; "took out 30% for ulcers"  . Nm myoview ltd  07/22/2013    Normal EF ~60%, no ischemia or infarction.  . Colonoscopy [name] 12/02/2013    Procedure: COLONOSCOPY;  Surgeon: Irene Shipper, MD;  Location: [place];  Service: Endoscopy;  Laterality: [name];  . Cardiac catheterization  2012    nl LV function, no occlusive CAD, PAF  . Knee bursectomy Left 1998  . Incision and drainage abscess / hematoma of bursa / knee / thigh Left 1998    knee  . Cataract extraction w/ intraocular lens  implant, bilateral Bilateral   . Yag laser application Bilateral   . Carpal tunnel release Bilateral 01/30/2014    Procedure: BILATERAL CARPAL TUNNEL RELEASE;  Surgeon: Marianna Payment, MD;  Location: [place];  Service: Orthopedics;  Laterality: Bilateral;  . Transthoracic echocardiogram  02/16/2014    EF 60%, no RWMA. - otherwise normal  . Laparoscopic cholecystectomy  03/2010  . Esophagogastroduodenoscopy [name] 09/22/2014    Procedure: ESOPHAGOGASTRODUODENOSCOPY (EGD);  Surgeon: Ronald Lobo, MD;  Location: [facility] ENDOSCOPY;  Service: Endoscopy;  Laterality: [name];   Family History  Problem Relation Age of Onset  . Cancer Mother   . Heart attack Father   . Heart attack Brother    Social History  Substance Use Topics  . Smoking status: Current Every Day Smoker -- 0.50 packs/day for 55 years    Types: Cigarettes  . Smokeless tobacco:  Never Used     Comment: He's been smoking between 0.5 and 2 ppd since age 39.  [name] Alcohol Use: No     Comment: "quit drinking in 1986"    Review of Systems  Constitutional: Negative for fever and chills.  HENT: Negative for congestion and sore throat.   Eyes: Negative for visual disturbance.  Respiratory: Negative for cough and shortness of breath.   Cardiovascular: Negative.   Gastrointestinal: Negative for nausea, vomiting and abdominal pain.  Genitourinary: Negative for dysuria.  Musculoskeletal: Positive for neck pain (Chronic). Negative for myalgias.  Skin: Negative for rash.  Neurological: Positive for syncope and weakness. Negative for dizziness and headaches.      Allergies  Review of patient's allergies indicates no known allergies.  Home Medications   Prior to Admission medications   Medication Sig Start Date End Date Taking? Authorizing Provider  acetaminophen (TYLENOL) 650 MG CR tablet Take 650 mg by mouth 2 (two) times daily as needed for pain.   Yes Historical Provider, MD  aspirin EC 81 MG tablet Take 81 mg by mouth daily.   Yes Historical Provider, MD  diltiazem (CARDIZEM CD) 120 MG 24 hr capsule Take 1 capsule (120 mg total) by mouth daily. 03/19/15  Yes Erlene Quan, PA-C  ferrous sulfate 325 (65 FE) MG tablet Take 325 mg by mouth daily with breakfast.   Yes Historical Provider, MD  flecainide (TAMBOCOR) 150 MG tablet Take 1.5 tablets (225 mg total) by mouth 2 (two) times daily. 03/19/15  Yes Erlene Quan, PA-C  HYDROcodone-acetaminophen (NORCO/VICODIN) 5-325 MG tablet Take 0.5 tablets by mouth 3 (three) times daily as needed for moderate pain.  02/27/15  Yes Historical Provider, MD  hydrocortisone cream 1 % Apply 1 application topically 2 (two) times daily as needed for itching.  04/27/15  Yes Historical Provider, MD  LANTUS SOLOSTAR 100 UNIT/ML Solostar Pen Inject 2-3 Units as directed 3 (three) times daily.  08/31/14  Yes Historical Provider, MD  loratadine  (CLARITIN) 10 MG tablet Take 10 mg by mouth every evening.    Yes Historical Provider, MD  metFORMIN (GLUCOPHAGE) 1000 MG tablet Take 500 mg by mouth 2 (two) times daily with a meal.    Yes Historical Provider, MD  methocarbamol (ROBAXIN) 750 MG tablet Take 750 mg by mouth every 6 (six) hours as needed for muscle spasms.  04/17/15  Yes Historical Provider, MD  metoprolol succinate (TOPROL XL) 25 MG 24 hr tablet Take 0.5 tablets (12.5 mg total) by mouth daily. Patient taking differently: Take 12.5 mg by mouth every evening.  04/26/15  Yes Leonie Man, MD  Multiple Vitamin (MULTIVITAMIN WITH MINERALS) TABS tablet Take 1 tablet by mouth every evening.    Yes Historical Provider, MD  nitroGLYCERIN (NITROSTAT) 0.4 MG SL tablet Place 1 tablet (0.4 mg  total) under the tongue every 5 (five) minutes x 3 doses as needed for chest pain. 05/22/14  Yes Leonie Man, MD  pantoprazole (PROTONIX) 40 MG tablet Take 1 tablet (40 mg total) by mouth daily. Patient taking differently: Take 40 mg by mouth every evening.  09/24/14  Yes Thurnell Lose, MD  pravastatin (PRAVACHOL) 40 MG tablet TAKE 1 TABLET EVERY EVENING 10/26/14  Yes Leonie Man, MD  tamsulosin (FLOMAX) 0.4 MG CAPS capsule Take 0.4 mg by mouth every morning. 04/19/15  Yes Historical Provider, MD  warfarin (COUMADIN) 5 MG tablet TAKE 1 TO 1 AND 1/2 TABLETS DAILY AS DIRECTED Patient taking differently: TAKE 1 TO 1 AND 1/2 TABLETS DAILY AS DIRECTED every evening, 5 mg Mon, Wed, and Fri.  7.5 mg all other days 04/06/15  Yes Troy Sine, MD  acetaminophen (TYLENOL) 500 MG tablet Take 1 tablet (500 mg total) by mouth 2 (two) times daily. Patient not taking: Reported on 05/15/2015 03/12/15   Domenic Moras, PA-C  albuterol (PROVENTIL HFA;VENTOLIN HFA) 108 (90 BASE) MCG/ACT inhaler Inhale 2 puffs into the lungs every 4 (four) hours as needed for wheezing or shortness of breath. Patient not taking: Reported on 05/15/2015 09/14/14   Sherwood Gambler, MD   SpO2  96% Physical Exam  Constitutional: He is oriented to person, place, and time. He appears well-developed and well-nourished.  NAD  HENT:  No evidence of tongue biting  Neck: Normal range of motion. Neck supple. No JVD present.  No carotid bruits  Cardiovascular: Normal rate, regular rhythm and normal heart sounds.  Exam reveals no gallop and no friction rub.   No murmur heard. Intact and equal pulses in all four extremities  Pulmonary/Chest: Effort normal. No respiratory distress. He has wheezes.  Abdominal:  Soft, non-tender, nondistended No abdominal bruits No pulsatile masses  Musculoskeletal: He exhibits no edema.  Neurological: He is alert and oriented to person, place, and time.  Alert, oriented, thought content appropriate, able to give a coherent history. Speech is clear and goal oriented, able to follow commands.  Cranial Nerves:  II:  Peripheral visual fields grossly normal, pupils equal, round, reactive to light III, IV, VI: EOM intact bilaterally, ptosis not present V,VII: smile symmetric, eyes kept closed tightly against resistance, facial light touch sensation equal VIII: hearing grossly normal IX, X: symmetric soft palate movement, uvula elevates symmetrically  XI: bilateral shoulder shrug symmetric and strong XII: midline tongue extension 5/5 muscle strength in upper and lower extremities bilaterally including strong and equal grip strength and dorsiflexion/plantar flexion Sensory to light touch normal in all four extremities.  Normal finger-to-nose and rapid alternating movements  Skin: No pallor.  Psychiatric: He has a normal mood and affect. His behavior is normal. Judgment and thought content normal.  Nursing note and vitals reviewed.   ED Course  Procedures (including critical care time) Labs Review Labs Reviewed  CBG MONITORING, ED - Abnormal; Notable for the following:    Glucose-Capillary 140 (*)    All other components within normal limits  CBG  MONITORING, ED - Abnormal; Notable for the following:    Glucose-Capillary 138 (*)    All other components within normal limits  CBC WITH DIFFERENTIAL/PLATELET  COMPREHENSIVE METABOLIC PANEL  TROPONIN I  URINALYSIS, ROUTINE W REFLEX MICROSCOPIC (NOT AT [facility])    Imaging Review No results found. I have personally reviewed and evaluated these images and lab results as part of my medical decision-making.   EKG Interpretation None  MDM   Final diagnoses:  Syncope   Erice Verzosa presents after witnessed syncopal event lasting 1-2 minutes while sitting in the passenger's seat of car. Patient states he was back to baseline mentation immediately upon awakening - no post-ictal phase or incontinence. No prodromal symptoms. No focal neuro deficits upon exam. Hypotensive initially upon arrival. Given 1L fluids with improvement of blood pressure.   Labs: see above; urine sent for cx CBC with H&H of 9.2/30.4; hemoccult +  Imaging: CXR shows no acute cardiopulmonary disease, CT head with no acute changes.   A&P: Consult to hospitalist who will admit for observation.   Patient seen by and discussed with Dr. Billy Fischer who agrees with treatment plan.   [facility] Dae Highley, PA-C 05/15/15 1552  Gareth Morgan, MD 05/17/15 2252

## 2015-05-15 NOTE — ED Notes (Signed)
Pt arrives EMS with c/o witnessed syncope by daughter lasting 1-2 minutes. Pt alert, with skin pink warm and dry by  EMS.

## 2015-05-15 NOTE — Progress Notes (Signed)
ANTICOAGULATION CONSULT NOTE - Initial Consult  Pharmacy Consult for Warfarin Indication: atrial fibrillation  No Known Allergies  Patient Measurements: Height: 5\' 6"  (167.6 cm) Weight: 190 lb 12.8 oz (86.546 kg) IBW/kg (Calculated) : 63.8 Heparin Dosing Weight:   Vital Signs: Temp: 98.6 F (37 C) (02/18 1827) Temp Source: Oral (02/18 1827) BP: 134/72 mmHg (02/18 1827) Pulse Rate: 77 (02/18 1827)  Labs:  Recent Labs  05/14/15 0940 05/15/15 1252 05/15/15 1352  HGB  --  9.3*  --   HCT  --  30.4*  --   PLT  --  267  --   LABPROT  --   --  30.1*  INR 3  --  2.93*  CREATININE  --  1.00  --   TROPONINI  --  <0.03  --     Estimated Creatinine Clearance: 64.8 mL/min (by C-G formula based on Cr of 1).   Medical History: Past Medical History  Diagnosis Date  . Atrial flutter ([place])     a. 07/2010 Status post caval tricuspid isthmus ablation by Dr. Midge Aver Metoprolol daily  . PAF (paroxysmal atrial fibrillation) ([place])     a. Recurrent after atrial flutter, currently controlled on flecainide plus diltiazem  . Hyperlipidemia     takes Pravastatin daily  . Diastolic CHF, chronic ([place])     a. 12/2012 EF 55-60%, diast dysfxn, triv MR, mildly dil LA/RA.  [name] Coronary artery disease, non-occlusive     a. 03/2010 Nonocclusive disease by cath, performed for ST elevations on ECG;  b. 06/2013 Lexi MV: EF 60%, no ischemia.  . Cervical radiculopathy due to degenerative joint disease of spine   . HTN (hypertension)     takes Diltiazem daily  . Dysrhythmia     HX OF ATRIAL IFB /FLUTTER takes Flecanide and Coumadin daily  . History of bronchitis     1998  . Weakness     numbness and tingling both hands  . Joint pain   . History of gastric ulcer   . Anemia     takes Ferrous Sulfate daily  . History of blood transfusion 1982    "when I had stomach OR"  . Balance problem 01/2014  . Diabetes mellitus type II     takes Metformin and Lantus daily  . Pneumonia 1999  . CAP  (community acquired pneumonia) 09/18/2014  . Arthritis     "all over"  . COPD (chronic obstructive pulmonary disease) ([facility])     Medications:  Prescriptions prior to admission  Medication Sig Dispense Refill Last Dose  . acetaminophen (TYLENOL) 650 MG CR tablet Take 650 mg by mouth 2 (two) times daily as needed for pain.   05/15/2015 at Unknown time  . aspirin EC 81 MG tablet Take 81 mg by mouth daily.   05/15/2015 at Unknown time  . diltiazem (CARDIZEM CD) 120 MG 24 hr capsule Take 1 capsule (120 mg total) by mouth daily. 90 capsule 3 05/15/2015 at Unknown time  . ferrous sulfate 325 (65 FE) MG tablet Take 325 mg by mouth daily with breakfast.   05/15/2015 at Unknown time  . flecainide (TAMBOCOR) 150 MG tablet Take 1.5 tablets (225 mg total) by mouth 2 (two) times daily. 90 tablet 3 05/15/2015 at Unknown time  . HYDROcodone-acetaminophen (NORCO/VICODIN) 5-325 MG tablet Take 0.5 tablets by mouth 3 (three) times daily as needed for moderate pain.    05/15/2015 at Unknown time  . hydrocortisone cream 1 % Apply 1 application topically 2 (two) times daily  as needed for itching.    05/14/2015 at Unknown time  . LANTUS SOLOSTAR 100 UNIT/ML Solostar Pen Inject 2-3 Units as directed 3 (three) times daily.    05/15/2015 at Unknown time  . loratadine (CLARITIN) 10 MG tablet Take 10 mg by mouth every evening.    05/14/2015 at Unknown time  . metFORMIN (GLUCOPHAGE) 1000 MG tablet Take 500 mg by mouth 2 (two) times daily with a meal.    05/15/2015 at Unknown time  . methocarbamol (ROBAXIN) 750 MG tablet Take 750 mg by mouth every 6 (six) hours as needed for muscle spasms.    05/15/2015 at Unknown time  . metoprolol succinate (TOPROL XL) 25 MG 24 hr tablet Take 0.5 tablets (12.5 mg total) by mouth daily. (Patient taking differently: Take 12.5 mg by mouth every evening. ) 45 tablet 3 05/14/2015 at Unknown time  . Multiple Vitamin (MULTIVITAMIN WITH MINERALS) TABS tablet Take 1 tablet by mouth every evening.    05/14/2015 at  Unknown time  . nitroGLYCERIN (NITROSTAT) 0.4 MG SL tablet Place 1 tablet (0.4 mg total) under the tongue every 5 (five) minutes x 3 doses as needed for chest pain. 25 tablet 9 3 years  . pantoprazole (PROTONIX) 40 MG tablet Take 1 tablet (40 mg total) by mouth daily. (Patient taking differently: Take 40 mg by mouth every evening. ) 30 tablet 1 05/14/2015 at Unknown time  . pravastatin (PRAVACHOL) 40 MG tablet TAKE 1 TABLET EVERY EVENING 90 tablet 3 05/14/2015 at Unknown time  . tamsulosin (FLOMAX) 0.4 MG CAPS capsule Take 0.4 mg by mouth every morning.   05/15/2015 at Unknown time  . warfarin (COUMADIN) 5 MG tablet TAKE 1 TO 1 AND 1/2 TABLETS DAILY AS DIRECTED (Patient taking differently: TAKE 1 TO 1 AND 1/2 TABLETS DAILY AS DIRECTED every evening, 5 mg Mon, Wed, and Fri.  7.5 mg all other days) 135 tablet 1 05/14/2015 at Unknown time  . acetaminophen (TYLENOL) 500 MG tablet Take 1 tablet (500 mg total) by mouth 2 (two) times daily. (Patient not taking: Reported on 05/15/2015) 30 tablet 0 Not Taking at Unknown time  . albuterol (PROVENTIL HFA;VENTOLIN HFA) 108 (90 BASE) MCG/ACT inhaler Inhale 2 puffs into the lungs every 4 (four) hours as needed for wheezing or shortness of breath. (Patient not taking: Reported on 05/15/2015) 1 Inhaler 0 Not Taking at Unknown time   Scheduled:  . aspirin EC  81 mg Oral Daily  . diltiazem  120 mg Oral Daily  . [START ON 05/16/2015] ferrous sulfate  325 mg Oral Q breakfast  . flecainide  225 mg Oral BID  . insulin aspart  0-5 Units Subcutaneous QHS  . [START ON 05/16/2015] insulin aspart  0-9 Units Subcutaneous TID WC  . loratadine  10 mg Oral QPM  . metoprolol succinate  12.5 mg Oral QPM  . multivitamin with minerals  1 tablet Oral QPM  . pantoprazole  40 mg Oral QPM  . pravastatin  40 mg Oral QPM  . sodium chloride flush  3 mL Intravenous Q12H  . sucralfate  1 g Oral TID WC & HS  . [START ON 05/16/2015] tamsulosin  0.4 mg Oral [id number]   Infusions:     Assessment: 75yo male presents with syncopal episode. Pharmacy is consulted to dose warfarin for atrial fibrillation. INR on admit is 2.93, Hgb 9.3, Plt wnl, sCr 1.   PTA Warfarin Dose: 5mg  MWF and 7.5mg  AODs with last dose 2/17  Goal of Therapy:  INR 2-3  Monitor platelets by anticoagulation protocol: Yes   Plan:  Warfarin 5mg  tonight x1 Daily INR Monitor s/sx of bleeding  Andrey Cota. Diona Foley, PharmD, [place] Pharmacist Pager [phone] 05/15/2015,6:49 PM

## 2015-05-15 NOTE — ED Notes (Signed)
Admitting NP at bedside

## 2015-05-15 NOTE — ED Notes (Signed)
Patient transported to X-ray & CT °

## 2015-05-15 NOTE — H&P (Signed)
[facility] History and Physical                                                                                    [name], is a 75 y.o. male  MRN: [id number]   DOB - Apr 28, 1940  Admit Date - 05/15/2015  Outpatient Primary MD for the patient is [name], MD  Referring MD: Billy Fischer / ER  PMH: Past Medical History  Diagnosis Date  . Atrial flutter ([place])     a. 07/2010 Status post caval tricuspid isthmus ablation by Dr. Midge Aver Metoprolol daily  . PAF (paroxysmal atrial fibrillation) ([place])     a. Recurrent after atrial flutter, currently controlled on flecainide plus diltiazem  . Hyperlipidemia     takes Pravastatin daily  . Diastolic CHF, chronic ([place])     a. 12/2012 EF 55-60%, diast dysfxn, triv MR, mildly dil LA/RA.  [name] Coronary artery disease, non-occlusive     a. 03/2010 Nonocclusive disease by cath, performed for ST elevations on ECG;  b. 06/2013 Lexi MV: EF 60%, no ischemia.  . Cervical radiculopathy due to degenerative joint disease of spine   . HTN (hypertension)     takes Diltiazem daily  . Dysrhythmia     HX OF ATRIAL IFB /FLUTTER takes Flecanide and Coumadin daily  . History of bronchitis     1998  . Weakness     numbness and tingling both hands  . Joint pain   . History of gastric ulcer   . Anemia     takes Ferrous Sulfate daily  . History of blood transfusion 1982    "when I had stomach OR"  . Balance problem 01/2014  . Diabetes mellitus type II     takes Metformin and Lantus daily  . Pneumonia 1999  . CAP (community acquired pneumonia) 09/18/2014  . Arthritis     "all over"  . COPD (chronic obstructive pulmonary disease) ([facility])       PSH: Past Surgical History  Procedure Laterality Date  . Atrial ablation surgery  08/05/10    CTI ablation for atrial flutter by JA  . Cardioversion  12/07/2010     Successful direct current cardioversion with atrial fibrillation to normal sinus rhythm  . Partial gastrectomy  1982    subtotal;  "took out 30% for ulcers"  . Nm myoview ltd  07/22/2013    Normal EF ~60%, no ischemia or infarction.  . Colonoscopy [name] 12/02/2013    Procedure: COLONOSCOPY;  Surgeon: Irene Shipper, MD;  Location: [facility];  Service: Endoscopy;  Laterality: [name];  . Cardiac catheterization  2012    nl LV function, no occlusive CAD, PAF  . Knee bursectomy Left 1998  . Incision and drainage abscess / hematoma of bursa / knee / thigh Left 1998    knee  . Cataract extraction w/ intraocular lens  implant, bilateral Bilateral   . Yag laser application Bilateral   . Carpal tunnel release Bilateral 01/30/2014    Procedure: BILATERAL CARPAL TUNNEL RELEASE;  Surgeon: Marianna Payment, MD;  Location: [facility];  Service: Orthopedics;  Laterality: Bilateral;  . Transthoracic echocardiogram  02/16/2014    EF  60%, no RWMA. - otherwise normal  . Laparoscopic cholecystectomy  03/2010  . Esophagogastroduodenoscopy [name] 09/22/2014    Procedure: ESOPHAGOGASTRODUODENOSCOPY (EGD);  Surgeon: Ronald Lobo, MD;  Location: [facility] ENDOSCOPY;  Service: Endoscopy;  Laterality: [name];     CC: No chief complaint on file.    HPI: This is a 75 year old male patient who has a history of nonobstructive CAD by cardiac catheterization in 2012 with subsequently responded to April 2015, history of atrial flutter post-ablation May 2012 followed by DC CV September 2012 for recurrent PAF maintaining sinus rhythm on combination of beta blockers, calcium channel blockers and flecainide. He also is on Coumadin. He was last admitted by cardiology in December 2012 for noncardiac chest pain. He also carries a history of hypertension, chronic diastolic congestive heart failure, history of prior CVA, legally blind, dyslipidemia, and ongoing tobacco abuse. Patient also has history of chronic neck pain and underwent MRI in October 2016 which demonstrated severe cervical stenosis. Patient was apparently referred to Dr. Cyndy Freeze of neurosurgery but apparently was  reluctant to undergo surgical procedure. Patient reports that last night he was having difficulty sleeping because of severe neck pain.   Today he had a witnessed syncopal event while sitting in the car next to his daughter. The last thing the patient recalls was getting into the car and the next thing he remembers was waking up beside his daughter who was on the phone. Daughter reports that patient became unresponsive and had myoclonic jerking of the right side without any incontinence of bowel or bladder. Patient immediately awakened from this episode. Patient has not had any new medications initiated and has not been sick with any kind of upper respiratory symptoms, nausea vomiting diarrhea. He reports feeling weak 1 month and he suspects this may be related to his chronic anemia. Patient has not had any awareness of any chest pain chest pressure this evening on exertion or palpitations.  ER Evaluation and treatment: At presentation patient was afebrile with a temperature of 97.8, supine blood pressure 93/56, heart rate 69 respirations 16. He has been given IV fluids in the ER and when orthostatic vital signs were checked post infusion of fluids these were within normal limits. He was not hypoxemic. EKG: Sinus rhythm with ventricular rate 74 bpm, QTC 447 ms, elevated J point's in inferior lateral leads unchanged from previous EKGs CT of the head without contrast: Stable atrophy and chronic white matter microvascular ischemic changes without any acute changes 2 View CXR: No acute or active cardiopulmonary disease Laboratory data: Na 135, K 4.0, BUN 16, Cr 1.00, glucose 145, albumin 2.7, ALT 88 with normal AST and normal total bilirubin, troponin less than 0.03, lactic acid 1.75, CBC 7600 with hemoglobin 9.3 and platelets 267,000, INR therapeutic at 2.9, Urinalysis:  WBCs 6-30 with rare bacteria and small leukocytes, FOB is positive Normal saline IV fluid One half tablet of a 5/325 mg Vicodin  Review  of Systems   In addition to the HPI above,  No Fever-chills, myalgias or other constitutional symptoms No Headache, changes with Vision or hearing, new weakness, tingling, numbness in any extremity-daughter noted myoclonic jerking of right side with syncopal episode today without incontinence or postictal phase No problems swallowing food or Liquids, indigestion/reflux No Chest pain, Cough or Shortness of Breath, palpitations, orthopnea or DOE No Abdominal pain, N/V; no melena or hematochezia, no dark tarry stools No dysuria, hematuria or flank pain No new skin rashes, lesions, masses or bruises, No new joints pains-aches No recent  weight gain or loss No polyuria, polydypsia or polyphagia,  *A full 10 point Review of Systems was done, except as stated above, all other Review of Systems were negative.  Social History Social History  Substance Use Topics  . Smoking status: Current Every Day Smoker -- 0.50 packs/day for 55 years    Types: Cigarettes  . Smokeless tobacco: Never Used     Comment: He's been smoking between 0.5 and 2 ppd since age 89.  [name] Alcohol Use: No     Comment: "quit drinking in 1986"    Resides at: Private residence  Lives with: Alone  Ambulatory status: Without assistive devices   Family History Family History  Problem Relation Age of Onset  . Cancer Mother   . Heart attack Father   . Heart attack Brother      Prior to Admission medications   Medication Sig Start Date End Date Taking? Authorizing Provider  acetaminophen (TYLENOL) 650 MG CR tablet Take 650 mg by mouth 2 (two) times daily as needed for pain.   Yes Historical Provider, MD  aspirin EC 81 MG tablet Take 81 mg by mouth daily.   Yes Historical Provider, MD  diltiazem (CARDIZEM CD) 120 MG 24 hr capsule Take 1 capsule (120 mg total) by mouth daily. 03/19/15  Yes Erlene Quan, PA-C  ferrous sulfate 325 (65 FE) MG tablet Take 325 mg by mouth daily with breakfast.   Yes Historical Provider, MD    flecainide (TAMBOCOR) 150 MG tablet Take 1.5 tablets (225 mg total) by mouth 2 (two) times daily. 03/19/15  Yes Erlene Quan, PA-C  HYDROcodone-acetaminophen (NORCO/VICODIN) 5-325 MG tablet Take 0.5 tablets by mouth 3 (three) times daily as needed for moderate pain.  02/27/15  Yes Historical Provider, MD  hydrocortisone cream 1 % Apply 1 application topically 2 (two) times daily as needed for itching.  04/27/15  Yes Historical Provider, MD  LANTUS SOLOSTAR 100 UNIT/ML Solostar Pen Inject 2-3 Units as directed 3 (three) times daily.  08/31/14  Yes Historical Provider, MD  loratadine (CLARITIN) 10 MG tablet Take 10 mg by mouth every evening.    Yes Historical Provider, MD  metFORMIN (GLUCOPHAGE) 1000 MG tablet Take 500 mg by mouth 2 (two) times daily with a meal.    Yes Historical Provider, MD  methocarbamol (ROBAXIN) 750 MG tablet Take 750 mg by mouth every 6 (six) hours as needed for muscle spasms.  04/17/15  Yes Historical Provider, MD  metoprolol succinate (TOPROL XL) 25 MG 24 hr tablet Take 0.5 tablets (12.5 mg total) by mouth daily. Patient taking differently: Take 12.5 mg by mouth every evening.  04/26/15  Yes Leonie Man, MD  Multiple Vitamin (MULTIVITAMIN WITH MINERALS) TABS tablet Take 1 tablet by mouth every evening.    Yes Historical Provider, MD  nitroGLYCERIN (NITROSTAT) 0.4 MG SL tablet Place 1 tablet (0.4 mg total) under the tongue every 5 (five) minutes x 3 doses as needed for chest pain. 05/22/14  Yes Leonie Man, MD  pantoprazole (PROTONIX) 40 MG tablet Take 1 tablet (40 mg total) by mouth daily. Patient taking differently: Take 40 mg by mouth every evening.  09/24/14  Yes Thurnell Lose, MD  pravastatin (PRAVACHOL) 40 MG tablet TAKE 1 TABLET EVERY EVENING 10/26/14  Yes Leonie Man, MD  tamsulosin (FLOMAX) 0.4 MG CAPS capsule Take 0.4 mg by mouth every morning. 04/19/15  Yes Historical Provider, MD  warfarin (COUMADIN) 5 MG tablet TAKE 1 TO 1 AND 1/2  TABLETS DAILY AS  DIRECTED Patient taking differently: TAKE 1 TO 1 AND 1/2 TABLETS DAILY AS DIRECTED every evening, 5 mg Mon, Wed, and Fri.  7.5 mg all other days 04/06/15  Yes Troy Sine, MD  acetaminophen (TYLENOL) 500 MG tablet Take 1 tablet (500 mg total) by mouth 2 (two) times daily. Patient not taking: Reported on 05/15/2015 03/12/15   Domenic Moras, PA-C  albuterol (PROVENTIL HFA;VENTOLIN HFA) 108 (90 BASE) MCG/ACT inhaler Inhale 2 puffs into the lungs every 4 (four) hours as needed for wheezing or shortness of breath. Patient not taking: Reported on 05/15/2015 09/14/14   Sherwood Gambler, MD    No Known Allergies  Physical Exam  Vitals  Blood pressure 125/65, pulse 71, temperature 97.8 F (36.6 C), temperature source Oral, resp. rate 19, SpO2 99 %.   General:  In no acute distress, appears chronically ill  Psych:  Normal affect, Denies Suicidal or Homicidal ideations, Awake Alert, Oriented X 3. Speech and thought patterns are clear and appropriate, no apparent short term memory deficits  Neuro:   No focal neurological deficits, CN II through XII intact except legally blind and has chronic left lateral disconjugate gaze, Strength 5/5 all 4 extremities, Sensation intact all 4 extremities.  ENT:  Ears and Eyes appear Normal, Conjunctivae clear, PER. Moist oral mucosa without erythema or exudates.  Neck:  Supple, No lymphadenopathy appreciated  Respiratory:  Symmetrical chest wall movement, Good air movement bilaterally, CTAB. Room Air  Cardiac:  RRR, No Murmurs, no LE edema noted, no JVD, No carotid bruits, peripheral pulses palpable at 2+  Abdomen:  Positive bowel sounds, Soft, Non tender, Non distended,  No masses appreciated, no obvious hepatosplenomegaly  Skin:  No Cyanosis, Normal Skin Turgor, No Skin Rash or Bruise.  Extremities: Symmetrical without obvious trauma or injury,  no effusions.  Data Review  CBC  Recent Labs Lab 05/15/15 1252  WBC 7.6  HGB 9.3*  HCT 30.4*  PLT 267   MCV 92.4  MCH 28.3  MCHC 30.6  RDW 12.9  LYMPHSABS 1.2  MONOABS 0.7  EOSABS 0.2  BASOSABS 0.0    Chemistries   Recent Labs Lab 05/15/15 1252  NA 135  K 4.0  CL 101  CO2 23  GLUCOSE 145*  BUN 16  CREATININE 1.00  CALCIUM 8.5*  AST 32  ALT 88*  ALKPHOS 68  BILITOT 0.3    CrCl cannot be calculated (Unknown ideal weight.).  No results for input(s): TSH, T4TOTAL, T3FREE, THYROIDAB in the last 72 hours.  Invalid input(s): FREET3  Coagulation profile  Recent Labs Lab 05/14/15 0940 05/15/15 1352  INR 3 2.93*    No results for input(s): DDIMER in the last 72 hours.  Cardiac Enzymes  Recent Labs Lab 05/15/15 1252  TROPONINI <0.03    Invalid input(s): POCBNP  Urinalysis    Component Value Date/Time   COLORURINE YELLOW 05/15/2015 [place] 05/15/2015 1347   LABSPEC 1.021 05/15/2015 1347   PHURINE 5.5 05/15/2015 1347   GLUCOSEU NEGATIVE 05/15/2015 1347   HGBUR NEGATIVE 05/15/2015 1347   BILIRUBINUR NEGATIVE 05/15/2015 1347   KETONESUR NEGATIVE 05/15/2015 1347   PROTEINUR NEGATIVE 05/15/2015 1347   UROBILINOGEN 0.2 10/09/2014 2315   NITRITE NEGATIVE 05/15/2015 1347   LEUKOCYTESUR SMALL* 05/15/2015 1347    Imaging results:   Dg Chest 2 View  05/15/2015  CLINICAL DATA:  Cough for 3 weeks EXAM: CHEST  2 VIEW COMPARISON:  03/10/2015 FINDINGS: Lungs are under aerated and grossly clear.  Normal heart size. No pneumothorax. No pleural effusion. IMPRESSION: No active cardiopulmonary disease. Electronically Signed   By: Marybelle Killings M.D.   On: 05/15/2015 14:22   Ct Head Wo Contrast  05/15/2015  CLINICAL DATA:  Syncopal episode, hypertension, possible seizure EXAM: CT HEAD WITHOUT CONTRAST TECHNIQUE: Contiguous axial images were obtained from the base of the skull through the vertex without contrast. COMPARISON:  05/31/2014 FINDINGS: Stable brain atrophy and chronic white matter microvascular ischemic changes about the ventricles throughout both  cerebral hemispheres. No acute intracranial hemorrhage, mass lesion, definite infarction, focal mass effect or edema. No hydrocephalus, midline shift, herniation, or extra-axial fluid collection. Cisterns are patent. Mild cerebellar atrophy as well. Orbits are symmetric. Mastoids and sinuses remain clear. No skull abnormality. IMPRESSION: Stable atrophy and chronic white matter microvascular ischemic changes. No interval change or acute process by noncontrast CT. Electronically Signed   By: Jerilynn Mages.  Shick M.D.   On: 05/15/2015 14:12     EKG: (Independently reviewed)  Sinus rhythm with ventricular rate 74 bpm, QTC 447 ms, elevated J point's in inferior lateral leads unchanged from previous EKGs   Assessment & Plan  Principal Problem:   Syncope -Witnessed syncopal event while seated with associated myoclonic right-sided jerking during event not consistent with seizure activity -Suspect syncopal event precipitated by symptomatic anemia as well as possible vasovagal etiology in setting of significant neck pain -Admit to telemetry/Obs -Had echocardiogram December 2016 so no indication to repeat this admission (no aortic stenosis noted and has moderate LVH) -Has not been having chest pain or palpitations and was admitted in December by cardiology and deemed to have noncardiac chest pain -Current EKG and troponin negative  Active Problems:   Cervical disc disease -Significant in nature and causing difficult to control pain -Continue preadmission Vicodin -Suspect myoclonus seen with syncopal episode related to cervical stenosis since is primarily right sided -Instructed patient to follow-up with physical therapy as well as neurosurgery as previously recommended -Currently no focal residual numbness or tingling or deficits    Anemia -History of significant iron deficiency anemia in June 2016; he received IV iron during that admission -On oral iron at home (if compliant) -EGD in June showed: evidence of  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), marginal erosion and mucosal hemorrhage and retained food debris without anatomic gastric outlet obstruction suggestive of gastric stasis -At that time GI recommended indefinite proton pump inhibitor and if remain persistently heme-positive or hemoglobin did not correct consider addition of sucralfate-we'll start sucralfate this admission -GI felt that Billroth II anatomy contributing to anemia -Had negative colonoscopy in 2015 only diminutive polyp found -Patient without any signs of melena or frank bleeding so no indication to consult GI at this juncture -Continue PPI as above -Follow hemoglobin and transfuse for hemoglobin less than or equal to 8.0 noting current hemoglobin around 9 and baseline around 11 -Continue warfarin for now   Diabetes Mellitus 2, controlled -We'll hold metformin acutely -SSI -Hemoglobin A1c was 5.8 in June thousand 16    Essential hypertension -Current blood pressure well controlled -On Toprol and Cardizem for rate control for underlying A. Fib    Chronic diastolic congestive heart failure  -Currently appears compensated    PAF maintaining SR -Patient denies awareness of palpitations -Continue BB. CCB, and Flecanide -CHADVASc=7    Abnormal urinalysis -Patient asymptomatic, afebrile and has no leukocytosis -Follow up on urine culture and treat only if positive    Coronary artery disease, non-occlusive -See above regarding syncope  History of CVA (cerebrovascular accident)    DVT Prophylaxis: Warfarin  Family Communication:   Daughter at bedside  Code Status:  Full code  Condition:  Stable  Discharge disposition: When medically stable anticipate discharge back to home  Time spent in minutes : 60      ELLIS,ALLISON L. ANP on 05/15/2015 at 5:07 PM  You may contact me by going to [email or web address] - password [email or web address]  I am available from 7a-7p but please confirm I am on  the schedule by going to Amion as above.   After 7p please contact night coverage person covering me after hours  [facility]  I have examined the patient, reviewed the chart and modified the above note which I agree with. Syncopal episode today. Orthostatics negative. Recent ECHO was normal. Drop in Hb and heme + stool as well. Cont PPI. Agree with checking anemia panel.   Delorean Knutzen,MD Pager # [phone] [email or web address] 05/15/2015, 5:43 PM

## 2015-05-15 NOTE — ED Notes (Signed)
MD at bedside. 

## 2015-05-16 ENCOUNTER — Observation Stay (HOSPITAL_COMMUNITY): Payer: Medicare Other

## 2015-05-16 DIAGNOSIS — I480 Paroxysmal atrial fibrillation: Secondary | ICD-10-CM

## 2015-05-16 DIAGNOSIS — I2510 Atherosclerotic heart disease of native coronary artery without angina pectoris: Secondary | ICD-10-CM

## 2015-05-16 DIAGNOSIS — I10 Essential (primary) hypertension: Secondary | ICD-10-CM

## 2015-05-16 DIAGNOSIS — M5090 Cervical disc disorder, unspecified, unspecified cervical region: Secondary | ICD-10-CM

## 2015-05-16 DIAGNOSIS — R55 Syncope and collapse: Secondary | ICD-10-CM | POA: Diagnosis not present

## 2015-05-16 DIAGNOSIS — Z8673 Personal history of transient ischemic attack (TIA), and cerebral infarction without residual deficits: Secondary | ICD-10-CM

## 2015-05-16 LAB — BASIC METABOLIC PANEL
Anion gap: 9 (ref 5–15)
BUN: 13 mg/dL (ref 6–20)
CO2: 25 mmol/L (ref 22–32)
CREATININE: 0.9 mg/dL (ref 0.61–1.24)
Calcium: 8.4 mg/dL — ABNORMAL LOW (ref 8.9–10.3)
Chloride: 106 mmol/L (ref 101–111)
GFR calc Af Amer: >60 mL/min (ref >60)
GLUCOSE: 160 mg/dL — AB (ref 65–99)
POTASSIUM: 3.8 mmol/L (ref 3.5–5.1)
SODIUM: 140 mmol/L (ref 135–145)

## 2015-05-16 LAB — CBC
HEMATOCRIT: 30.4 % — AB (ref 39.0–52.0)
Hemoglobin: 9.1 g/dL — ABNORMAL LOW (ref 13.0–17.0)
MCH: 28 pg (ref 26.0–34.0)
MCHC: 29.9 g/dL — AB (ref 30.0–36.0)
MCV: 93.5 fL (ref 78.0–100.0)
PLATELETS: 270 10*3/uL (ref 150–400)
RBC: 3.25 MIL/uL — ABNORMAL LOW (ref 4.22–5.81)
RDW: 13 % (ref 11.5–15.5)
WBC: 6 10*3/uL (ref 4.0–10.5)

## 2015-05-16 LAB — GLUCOSE, CAPILLARY
GLUCOSE-CAPILLARY: 165 mg/dL — AB (ref 65–99)
GLUCOSE-CAPILLARY: 162 mg/dL — AB (ref 65–99)
Glucose-Capillary: 151 mg/dL — ABNORMAL HIGH (ref 65–99)
Glucose-Capillary: 145 mg/dL — ABNORMAL HIGH (ref 65–99)

## 2015-05-16 LAB — PROTIME-INR
INR: 2.61 — AB (ref 0.00–1.49)
Prothrombin Time: 27.6 seconds — ABNORMAL HIGH (ref 11.6–15.2)

## 2015-05-16 LAB — TSH: TSH: 2.24 u[IU]/mL (ref 0.350–4.500)

## 2015-05-16 MED ORDER — WARFARIN SODIUM 7.5 MG PO TABS
7.5000 mg | ORAL_TABLET | Freq: Once | ORAL | Status: AC
  Administered 2015-05-16: 7.5 mg via ORAL
  Filled 2015-05-16: qty 1

## 2015-05-16 MED ORDER — DEXTROSE 5 % IV SOLN
1.0000 g | Freq: Every day | INTRAVENOUS | Status: DC
  Filled 2015-05-16 (×2): qty 10

## 2015-05-16 MED ORDER — IBUPROFEN 200 MG PO TABS
400.0000 mg | ORAL_TABLET | Freq: Four times a day (QID) | ORAL | Status: DC | PRN
  Administered 2015-05-16 – 2015-05-17 (×3): 400 mg via ORAL
  Filled 2015-05-16 (×3): qty 2

## 2015-05-16 MED ORDER — LORAZEPAM 2 MG/ML IJ SOLN
1.0000 mg | Freq: Once | INTRAMUSCULAR | Status: AC
  Administered 2015-05-16: 1 mg via INTRAVENOUS
  Filled 2015-05-16: qty 1

## 2015-05-16 MED ORDER — HYDROCODONE-ACETAMINOPHEN 5-325 MG PO TABS
0.5000 | ORAL_TABLET | Freq: Three times a day (TID) | ORAL | Status: DC | PRN
  Administered 2015-05-16: 0.5 via ORAL
  Filled 2015-05-16: qty 1

## 2015-05-16 NOTE — Consult Note (Signed)
Primary cardiologist: Dr Glenetta Hew Consulting cardiologist: Dr Carlyle Dolly Requesting physician: Dr Lala Lund  Clinical Summary [name] is a 75 y.o.male history of nonobstructive CAD by cath in 2012 with low risk myoview 2015, aflutter ablation 2012, PAF on flecanide and chronic coumadin, HTN, chronic diastolic HF, admitted with syncope. Episode occurred while sitting in daughters car. Denies any signficicant prodrome. No chest pain, no palpitations. Family reports some myoclonic jerking. Reports normal intake, had been feeling ok prior.    ER vitals: bp 92/55 p 69 98% sat. Orthostatics negative.  02/2015 echo: LVEF 50-55%, no WMAs Cr 1, BUN 16, Hgb 9.3, HCT 30.4, FOBT positive, ferritin 9,  EKG NSR CT head no acute changes, stable chronic changes CXR no acute process  No Known Allergies  Medications Scheduled Medications: . aspirin EC  81 mg Oral Daily  . cefTRIAXone (ROCEPHIN)  IV  1 g Intravenous Daily  . diltiazem  120 mg Oral Daily  . ferrous sulfate  325 mg Oral Q breakfast  . flecainide  225 mg Oral BID  . insulin aspart  0-5 Units Subcutaneous QHS  . insulin aspart  0-9 Units Subcutaneous TID WC  . loratadine  10 mg Oral QPM  . LORazepam  1 mg Intravenous Once  . metoprolol succinate  12.5 mg Oral QPM  . multivitamin with minerals  1 tablet Oral QPM  . pantoprazole  40 mg Oral QPM  . pravastatin  40 mg Oral QPM  . sodium chloride flush  3 mL Intravenous Q12H  . sucralfate  1 g Oral TID WC & HS  . tamsulosin  0.4 mg Oral BH-q7a  . warfarin  7.5 mg Oral ONCE-1800  . Warfarin - Pharmacist Dosing Inpatient   Does not apply q1800     Infusions:     PRN Medications:  ibuprofen, methocarbamol   Past Medical History  Diagnosis Date  . Atrial flutter ([place])     a. 07/2010 Status post caval tricuspid isthmus ablation by Dr. Midge Aver Metoprolol daily  . PAF (paroxysmal atrial fibrillation) ([place])     a. Recurrent after atrial flutter,  currently controlled on flecainide plus diltiazem  . Hyperlipidemia     takes Pravastatin daily  . Diastolic CHF, chronic ([place])     a. 12/2012 EF 55-60%, diast dysfxn, triv MR, mildly dil LA/RA.  [name] Coronary artery disease, non-occlusive     a. 03/2010 Nonocclusive disease by cath, performed for ST elevations on ECG;  b. 06/2013 Lexi MV: EF 60%, no ischemia.  . Cervical radiculopathy due to degenerative joint disease of spine   . HTN (hypertension)     takes Diltiazem daily  . Dysrhythmia     HX OF ATRIAL IFB /FLUTTER takes Flecanide and Coumadin daily  . History of bronchitis     1998  . Weakness     numbness and tingling both hands  . Joint pain   . History of gastric ulcer   . Anemia     takes Ferrous Sulfate daily  . History of blood transfusion 1982    "when I had stomach OR"  . Balance problem 01/2014  . Diabetes mellitus type II     takes Metformin and Lantus daily  . Pneumonia 1999  . CAP (community acquired pneumonia) 09/18/2014  . Arthritis     "all over"  . COPD (chronic obstructive pulmonary disease) [facility])     Past Surgical History  Procedure Laterality Date  . Atrial ablation surgery  08/05/10  CTI ablation for atrial flutter by JA  . Cardioversion  12/07/2010     Successful direct current cardioversion with atrial fibrillation to normal sinus rhythm  . Partial gastrectomy  1982    subtotal; "took out 30% for ulcers"  . Nm myoview ltd  07/22/2013    Normal EF ~60%, no ischemia or infarction.  . Colonoscopy [name] 12/02/2013    Procedure: COLONOSCOPY;  Surgeon: Irene Shipper, MD;  Location: [place];  Service: Endoscopy;  Laterality: [name];  . Cardiac catheterization  2012    nl LV function, no occlusive CAD, PAF  . Knee bursectomy Left 1998  . Incision and drainage abscess / hematoma of bursa / knee / thigh Left 1998    knee  . Cataract extraction w/ intraocular lens  implant, bilateral Bilateral   . Yag laser application Bilateral   . Carpal tunnel release  Bilateral 01/30/2014    Procedure: BILATERAL CARPAL TUNNEL RELEASE;  Surgeon: Marianna Payment, MD;  Location: [place];  Service: Orthopedics;  Laterality: Bilateral;  . Transthoracic echocardiogram  02/16/2014    EF 60%, no RWMA. - otherwise normal  . Laparoscopic cholecystectomy  03/2010  . Esophagogastroduodenoscopy [name] 09/22/2014    Procedure: ESOPHAGOGASTRODUODENOSCOPY (EGD);  Surgeon: Ronald Lobo, MD;  Location: [facility] ENDOSCOPY;  Service: Endoscopy;  Laterality: [name];    Family History  Problem Relation Age of Onset  . Cancer Mother   . Heart attack Father   . Heart attack Brother     Social History [name] reports that he has been smoking Cigarettes.  He has a 27.5 pack-year smoking history. He has never used smokeless tobacco. [name] reports that he does not drink alcohol.  Review of Systems CONSTITUTIONAL: No weight loss, fever, chills, weakness or fatigue.  HEENT: Eyes: No visual loss, blurred vision, double vision or yellow sclerae. No hearing loss, sneezing, congestion, runny nose or sore throat.  SKIN: No rash or itching.  CARDIOVASCULAR: No chest pain, chest pressure or chest discomfort. No palpitations or edema.  RESPIRATORY: No shortness of breath, cough or sputum.  GASTROINTESTINAL: No anorexia, nausea, vomiting or diarrhea. No abdominal pain or blood.  GENITOURINARY: no polyuria, no dysuria NEUROLOGICAL: syncope MUSCULOSKELETAL: No muscle, back pain, joint pain or stiffness.  HEMATOLOGIC: No anemia, bleeding or bruising.  LYMPHATICS: No enlarged nodes. No history of splenectomy.  PSYCHIATRIC: No history of depression or anxiety.      Physical Examination Blood pressure 109/51, pulse 84, temperature 97.4 F (36.3 C), temperature source Oral, resp. rate 18, height 5\' 6"  (1.676 m), weight 189 lb 2.5 oz (85.8 kg), SpO2 96 %.  Intake/Output Summary (Last 24 hours) at 05/16/15 1410 Last data filed at 05/16/15 1344  Gross per 24 hour  Intake    840 ml    Output    900 ml  Net    -60 ml    HEENT: sclera clear, throat clear  Cardiovascular: RRR, no m/r/g, no jvd  Respiratory: CTAB  GI: abdomen soft, NT, ND  MSK: no LE edema  Neuro: no focal deficits  Psych: appropriate affect   Lab Results  Basic Metabolic Panel:  Recent Labs Lab 05/15/15 1252 05/15/15 1901 05/16/15 0322  NA 135  --  140  K 4.0  --  3.8  CL 101  --  106  CO2 23  --  25  GLUCOSE 145*  --  160*  BUN 16  --  13  CREATININE 1.00  --  0.90  CALCIUM 8.5* 8.3*  8.4*  MG  --  1.8  --   PHOS  --  4.0  --     Liver Function Tests:  Recent Labs Lab 05/15/15 1252  AST 32  ALT 88*  ALKPHOS 68  BILITOT 0.3  PROT 5.3*  ALBUMIN 2.7*    CBC:  Recent Labs Lab 05/15/15 1252 05/16/15 0322  WBC 7.6 6.0  NEUTROABS 5.5  --   HGB 9.3* 9.1*  HCT 30.4* 30.4*  MCV 92.4 93.5  PLT 267 270    Cardiac Enzymes:  Recent Labs Lab 05/15/15 1252  TROPONINI <0.03    BNP: Invalid input(s): POCBNP     Impression/Recommendations 1. Syncope  - unclear etiology. Fairly normal recent echo. EKG and tele show NSR, occasional PVCs. Orthostatics negative. Has had noticeable low bp's with SBP in low 90s that may have contributed - stop Toprol XL. Continue tele. Follow neuro workup by primary team including EEG given myoclonic jerks. If negative inpatient eval consider outpatient monitor  2. Afib - low bp's, will stop Toprol and continue dilt with flecanide - remains in NSR   3. Anemia - Hgb 13.9-->9.1 - low ferritin - workup per primary team. No history of frank GI bleeding, continuing coumadin for now.    Carlyle Dolly, M.D.

## 2015-05-16 NOTE — Plan of Care (Signed)
Problem: Activity: Goal: Risk for activity intolerance will decrease Outcome: Adequate for Discharge Pt ambulating to restroom with limit assist and walking in halls with PT with walker

## 2015-05-16 NOTE — Progress Notes (Signed)
[place] for Warfarin Indication: atrial fibrillation  No Known Allergies  Patient Measurements: Height: 5\' 6"  (167.6 cm) Weight: 189 lb 2.5 oz (85.8 kg) IBW/kg (Calculated) : 63.8 Heparin Dosing Weight:   Vital Signs: Temp: 97.7 F (36.5 C) (02/19 0551) Temp Source: Oral (02/19 0551) BP: 104/43 mmHg (02/19 0551) Pulse Rate: 68 (02/19 0551)  Labs:  Recent Labs  05/14/15 0940 05/15/15 1252 05/15/15 1352 05/16/15 0322  HGB  --  9.3*  --  9.1*  HCT  --  30.4*  --  30.4*  PLT  --  267  --  270  LABPROT  --   --  30.1* 27.6*  INR 3  --  2.93* 2.61*  CREATININE  --  1.00  --  0.90  TROPONINI  --  <0.03  --   --     Estimated Creatinine Clearance: 71.7 mL/min (by C-G formula based on Cr of 0.9).   Scheduled:  . aspirin EC  81 mg Oral Daily  . cefTRIAXone (ROCEPHIN)  IV  1 g Intravenous Daily  . diltiazem  120 mg Oral Daily  . ferrous sulfate  325 mg Oral Q breakfast  . flecainide  225 mg Oral BID  . insulin aspart  0-5 Units Subcutaneous QHS  . insulin aspart  0-9 Units Subcutaneous TID WC  . loratadine  10 mg Oral QPM  . LORazepam  1 mg Intravenous Once  . metoprolol succinate  12.5 mg Oral QPM  . multivitamin with minerals  1 tablet Oral QPM  . pantoprazole  40 mg Oral QPM  . pravastatin  40 mg Oral QPM  . sodium chloride flush  3 mL Intravenous Q12H  . sucralfate  1 g Oral TID WC & HS  . tamsulosin  0.4 mg Oral BH-q7a  . Warfarin - Pharmacist Dosing Inpatient   Does not apply q1800    Assessment: 75yo male presents with syncopal episode. Pharmacy is consulted to dose warfarin for atrial fibrillation. INR today is 2.61 and at goal  PTA Warfarin Dose: 5mg  MWF and 7.5mg  STTS  Goal of Therapy:  INR 2-3 Monitor platelets by anticoagulation protocol: Yes   Plan:  Warfarin 7.5mg  tonight x1 Daily INR  Hildred Laser, Pharm D 05/16/2015 10:47 AM

## 2015-05-16 NOTE — Progress Notes (Signed)
Patient Demographics:    [name], is a 75 y.o. male, DOB - April 12, 1940, [id number]  Admit date - 05/15/2015   Admitting Physician Debbe Odea, MD  Outpatient Primary MD for the patient is [name], MD  LOS -    No chief complaint on file.       Subjective:    [name] today has, Mild chronic occipital headache, No chest pain, No abdominal pain - No Nausea, No new weakness tingling or numbness, No Cough - SOB.    Assessment  & Plan :     1. Syncope. This happened while he was sitting in the car, history of atrial fibrillation on Flecanide also has chronic neck pain and headache. Will check orthostatics, check a head MRI due to ongoing headaches, check EEG as he had myoclonic jerks on the right side when he passed out. Since he has history of atrial fibrillation and is on Flecanide have requested cardiology to evaluate as well. Monitor on telemetry. Initiate activity, PT monitor. If stable likely discharge in the morning. Note his QTC was 447 ms.  2. Chronic C-spine DJD. Has seen Dr. Cyndy Freeze neurosurgeon in the past, surgery was recommended but patient refused. Outpatient follow-up with neurosurgery.  3. Paroxysmal atrial fibrillation [place] vasc 2 score greater than 5. He is on beta blocker, diltiazem along with Flecanide, on Coumadin pharmacy monitoring. Cardiology requested to evaluate.  4. Essential hypertension. Stable on comminution of beta blocker and Cardizem.  5. Chronic diastolic CHF. Last EF of 55%. Currently compensated monitor.  6. History of CVA. No acute issues. Currently on aspirin and statin for secondary prevention continue.  7. DM type II. On sliding scale for now will continue to monitor.  Lab Results  Component Value Date   HGBA1C 5.8* 09/18/2014    CBG  (last 3)   Recent Labs  05/15/15 1249 05/15/15 2140 05/16/15 0733  GLUCAP 138* 172* 151*      Code Status : Full  Family Communication  : None present  Disposition Plan  : To be decided  Consults  :  Cardiology  Procedures  :    EEG, MRI brain  DVT Prophylaxis  :  Coumadin  Lab Results  Component Value Date   INR 2.61* 05/16/2015   INR 2.93* 05/15/2015   INR 3 05/14/2015     Lab Results  Component Value Date   PLT 270 05/16/2015    Inpatient Medications  Scheduled Meds: . aspirin EC  81 mg Oral Daily  . cefTRIAXone (ROCEPHIN)  IV  1 g Intravenous Daily  . diltiazem  120 mg Oral Daily  . ferrous sulfate  325 mg Oral Q breakfast  . flecainide  225 mg Oral BID  . insulin aspart  0-5 Units Subcutaneous QHS  . insulin aspart  0-9 Units Subcutaneous TID WC  . loratadine  10 mg Oral QPM  . LORazepam  1 mg Intravenous Once  . metoprolol succinate  12.5 mg Oral QPM  . multivitamin with minerals  1 tablet Oral QPM  . pantoprazole  40 mg Oral QPM  . pravastatin  40 mg Oral QPM  . sodium chloride flush  3 mL Intravenous Q12H  . sucralfate  1 g Oral TID WC & HS  .  tamsulosin  0.4 mg Oral BH-q7a  . Warfarin - Pharmacist Dosing Inpatient   Does not apply q1800   Continuous Infusions:  PRN Meds:.ibuprofen, methocarbamol  Antibiotics  :     Anti-infectives    Start     Dose/Rate Route Frequency Ordered Stop   05/16/15 0800  cefTRIAXone (ROCEPHIN) 1 g in dextrose 5 % 50 mL IVPB     1 g 100 mL/hr over 30 Minutes Intravenous Daily 05/16/15 0725          Objective:   Filed Vitals:   05/15/15 2038 05/15/15 2115 05/15/15 2206 05/16/15 0551  BP: 132/69 90/44 104/64 104/43  Pulse: 78 67 70 68  Temp:  97.9 F (36.6 C)  97.7 F (36.5 C)  TempSrc:  Oral  Oral  Resp:  18  18  Height:      Weight:    85.8 kg (189 lb 2.5 oz)  SpO2:  98%  99%    Wt Readings from Last 3 Encounters:  05/16/15 85.8 kg (189 lb 2.5 oz)  03/19/15 84.097 kg (185 lb 6.4 oz)    03/12/15 83.462 kg (184 lb)     Intake/Output Summary (Last 24 hours) at 05/16/15 1018 Last data filed at 05/16/15 0806  Gross per 24 hour  Intake    840 ml  Output    700 ml  Net    140 ml     Physical Exam  Awake Alert, Oriented X 3, No new F.N deficits, Normal affect [place].AT,PERRAL Supple Neck,No JVD, No cervical lymphadenopathy appriciated.  Symmetrical Chest wall movement, Good air movement bilaterally, CTAB RRR,No Gallops,Rubs or new Murmurs, No Parasternal Heave +ve B.Sounds, Abd Soft, No tenderness, No organomegaly appriciated, No rebound - guarding or rigidity. No Cyanosis, Clubbing or edema, No new Rash or bruise     Data Review:   Micro Results No results found for this or any previous visit (from the past 240 hour(s)).  Radiology Reports Dg Chest 2 View  05/15/2015  CLINICAL DATA:  Cough for 3 weeks EXAM: CHEST  2 VIEW COMPARISON:  03/10/2015 FINDINGS: Lungs are under aerated and grossly clear. Normal heart size. No pneumothorax. No pleural effusion. IMPRESSION: No active cardiopulmonary disease. Electronically Signed   By: Marybelle Killings M.D.   On: 05/15/2015 14:22   Ct Head Wo Contrast  05/15/2015  CLINICAL DATA:  Syncopal episode, hypertension, possible seizure EXAM: CT HEAD WITHOUT CONTRAST TECHNIQUE: Contiguous axial images were obtained from the base of the skull through the vertex without contrast. COMPARISON:  05/31/2014 FINDINGS: Stable brain atrophy and chronic white matter microvascular ischemic changes about the ventricles throughout both cerebral hemispheres. No acute intracranial hemorrhage, mass lesion, definite infarction, focal mass effect or edema. No hydrocephalus, midline shift, herniation, or extra-axial fluid collection. Cisterns are patent. Mild cerebellar atrophy as well. Orbits are symmetric. Mastoids and sinuses remain clear. No skull abnormality. IMPRESSION: Stable atrophy and chronic white matter microvascular ischemic changes. No interval  change or acute process by noncontrast CT. Electronically Signed   By: Jerilynn Mages.  Shick M.D.   On: 05/15/2015 14:12     CBC  Recent Labs Lab 05/15/15 1252 05/16/15 0322  WBC 7.6 6.0  HGB 9.3* 9.1*  HCT 30.4* 30.4*  PLT 267 270  MCV 92.4 93.5  MCH 28.3 28.0  MCHC 30.6 29.9*  RDW 12.9 13.0  LYMPHSABS 1.2  --   MONOABS 0.7  --   EOSABS 0.2  --   BASOSABS 0.0  --  Chemistries   Recent Labs Lab 05/15/15 1252 05/15/15 1901 05/16/15 0322  NA 135  --  140  K 4.0  --  3.8  CL 101  --  106  CO2 23  --  25  GLUCOSE 145*  --  160*  BUN 16  --  13  CREATININE 1.00  --  0.90  CALCIUM 8.5* 8.3* 8.4*  MG  --  1.8  --   AST 32  --   --   ALT 88*  --   --   ALKPHOS 68  --   --   BILITOT 0.3  --   --    ------------------------------------------------------------------------------------------------------------------ No results for input(s): CHOL, HDL, LDLCALC, TRIG, CHOLHDL, LDLDIRECT in the last 72 hours.  Lab Results  Component Value Date   HGBA1C 5.8* 09/18/2014   ------------------------------------------------------------------------------------------------------------------ No results for input(s): TSH, T4TOTAL, T3FREE, THYROIDAB in the last 72 hours.  Invalid input(s): FREET3 ------------------------------------------------------------------------------------------------------------------  Recent Labs  05/15/15 1803  VITAMINB12 373  FOLATE 22.6  FERRITIN 9*  TIBC 361  IRON 23*  RETICCTPCT 3.7*    Coagulation profile  Recent Labs Lab 05/14/15 0940 05/15/15 1352 05/16/15 0322  INR 3 2.93* 2.61*    No results for input(s): DDIMER in the last 72 hours.  Cardiac Enzymes  Recent Labs Lab 05/15/15 1252  TROPONINI <0.03   ------------------------------------------------------------------------------------------------------------------    Component Value Date/Time   BNP 49.5 09/29/2014 1723    Time Spent in minutes 35   Oveta Idris K M.D on  05/16/2015 at 10:18 AM  Between 7am to 7pm - Pager - [phone]  After 7pm go to [email or web address] - password [facility]  [facility] -  Office  [phone]

## 2015-05-16 NOTE — Evaluation (Signed)
Physical Therapy Evaluation Patient Details Name: [name] MRN: [id number] DOB: Sep 22, 1940 Today's Date: 05/16/2015   History of Present Illness  Mr. Mavity is a 75 y.o.male history of nonobstructive CAD by cath in 2012 with low risk myoview 2015, aflutter ablation 2012, PAF on flecanide and chronic coumadin, HTN, chronic diastolic HF, admitted with syncope. Episode occurred while sitting in daughters car. Denies any signficicant prodrome. No chest pain, no palpitations. Family reports some myoclonic jerking.  Clinical Impression  Pt admitted with above. Pt with noted balance impairment and decreased activity tolerance. Pt to benefit from HHPT to address mentioned deficits to decrease falls risk. Pt requires RW for safe ambulation at this time. Acute PT to con't to follow to progress mobility as able.    Follow Up Recommendations Home health PT;Supervision/Assistance - 24 hour    Equipment Recommendations       Recommendations for Other Services       Precautions / Restrictions Precautions Precautions: Fall Restrictions Weight Bearing Restrictions: No      Mobility  Bed Mobility Overal bed mobility: Needs Assistance Bed Mobility: Supine to Sit     Supine to sit: Supervision     General bed mobility comments: used bed rail  Transfers Overall transfer level: Needs assistance Equipment used: None Transfers: Sit to/from Stand Sit to Stand: Min guard         General transfer comment: min guard to steady pt due to slight impulsiviety/quick to move  Ambulation/Gait Ambulation/Gait assistance: Min guard;Min assist Ambulation Distance (Feet): 200 Feet Assistive device: Rolling walker (2 wheeled);None Gait Pattern/deviations: Step-through pattern;Staggering left;Staggering right Gait velocity: wfl   General Gait Details: initially began amb without AD but pt unsteady and reaching for ralling in hallway requiring minA for safety. Pt given RW pt with increased  stabilty and only requiring min guard more for safe walker management and to stay in walker  Stairs Stairs: Yes Stairs assistance: Min assist Stair Management: One rail Right;One rail Left;Step to pattern Number of Stairs: 3    Wheelchair Mobility    Modified Rankin (Stroke Patients Only)       Balance Overall balance assessment: Needs assistance         Standing balance support: No upper extremity supported Standing balance-Leahy Scale: Fair Standing balance comment: okay statically but requires something to hold onto during amb                             Pertinent Vitals/Pain Pain Assessment: 0-10 Pain Score: 10-Worst pain ever Pain Location: neck    Home Living Family/patient expects to be discharged to:: Private residence Living Arrangements: Children Available Help at Discharge: Family;Available PRN/intermittently (daughter and son in law work in afternoon from 230-9) Type of Home: Mobile home Home Access: Stairs to enter Entrance Stairs-Rails: Left;Right;Can reach both Technical brewer of Steps: 2 Home Layout: One level Home Equipment: None      Prior Function Level of Independence: Independent               Hand Dominance   Dominant Hand: Right    Extremity/Trunk Assessment   Upper Extremity Assessment: Overall WFL for tasks assessed           Lower Extremity Assessment: Generalized weakness      Cervical / Trunk Assessment: Kyphotic (forward head with elevated shlds)  Communication   Communication: No difficulties  Cognition Arousal/Alertness: Awake/alert Behavior During Therapy: WFL for tasks assessed/performed Overall Cognitive Status:  Within Functional Limits for tasks assessed                      General Comments General comments (skin integrity, edema, etc.): reports blurred vision from his DM. assisted pt to bathroom.  pt had to sit and urinate due to not steady standing    Exercises         Assessment/Plan    PT Assessment Patient needs continued PT services  PT Diagnosis Difficulty walking;Generalized weakness   PT Problem List Decreased strength;Decreased range of motion;Decreased activity tolerance;Decreased balance;Decreased mobility;Decreased knowledge of use of DME;Decreased safety awareness;Decreased coordination  PT Treatment Interventions DME instruction;Gait training;Stair training;Functional mobility training;Therapeutic activities;Therapeutic exercise;Balance training   PT Goals (Current goals can be found in the Care Plan section) Acute Rehab PT Goals Patient Stated Goal: stop the neck pain PT Goal Formulation: With patient Time For Goal Achievement: 05/23/15 Potential to Achieve Goals: Good    Frequency Min 3X/week   Barriers to discharge Decreased caregiver support family works in afternoon, home alone for 7 hours    Co-evaluation               End of Session Equipment Utilized During Treatment: Gait belt Activity Tolerance: Patient tolerated treatment well Patient left: in chair;with call bell/phone within reach;with chair alarm set Nurse Communication: Mobility status    Functional Assessment Tool Used: clinical judgement Functional Limitation: Mobility: Walking and moving around Mobility: Walking and Moving Around Current Status [phone]): At least 20 percent but less than 40 percent impaired, limited or restricted Mobility: Walking and Moving Around Goal Status [phone]): At least 1 percent but less than 20 percent impaired, limited or restricted    Time: 1423-1447 PT Time Calculation (min) (ACUTE ONLY): 24 min   Charges:   PT Evaluation $PT Eval Moderate Complexity: 1 Procedure PT Treatments $Gait Training: 8-22 mins   PT G Codes:   PT G-Codes **NOT FOR INPATIENT CLASS** Functional Assessment Tool Used: clinical judgement Functional Limitation: Mobility: Walking and moving around Mobility: Walking and Moving Around Current  Status [phone]): At least 20 percent but less than 40 percent impaired, limited or restricted Mobility: Walking and Moving Around Goal Status [phone]): At least 1 percent but less than 20 percent impaired, limited or restricted    Kingsley Callander 05/16/2015, 2:58 PM   Kittie Plater, PT, DPT Pager #: [phone] Office #: [phone]

## 2015-05-17 ENCOUNTER — Observation Stay (HOSPITAL_COMMUNITY): Payer: Medicare Other

## 2015-05-17 DIAGNOSIS — R8290 Unspecified abnormal findings in urine: Secondary | ICD-10-CM

## 2015-05-17 DIAGNOSIS — R4182 Altered mental status, unspecified: Secondary | ICD-10-CM | POA: Diagnosis not present

## 2015-05-17 DIAGNOSIS — R55 Syncope and collapse: Secondary | ICD-10-CM | POA: Diagnosis not present

## 2015-05-17 DIAGNOSIS — I5032 Chronic diastolic (congestive) heart failure: Secondary | ICD-10-CM | POA: Diagnosis not present

## 2015-05-17 DIAGNOSIS — M5090 Cervical disc disorder, unspecified, unspecified cervical region: Secondary | ICD-10-CM | POA: Diagnosis not present

## 2015-05-17 LAB — HEMOGLOBIN A1C
HEMOGLOBIN A1C: 6 % — AB (ref 4.8–5.6)
Hgb A1c MFr Bld: 6 % — ABNORMAL HIGH (ref 4.8–5.6)
MEAN PLASMA GLUCOSE: 126 mg/dL
MEAN PLASMA GLUCOSE: 126 mg/dL

## 2015-05-17 LAB — GLUCOSE, CAPILLARY
GLUCOSE-CAPILLARY: 161 mg/dL — AB (ref 65–99)
Glucose-Capillary: 159 mg/dL — ABNORMAL HIGH (ref 65–99)

## 2015-05-17 LAB — PROTIME-INR
INR: 1.91 — AB (ref 0.00–1.49)
PROTHROMBIN TIME: 21.8 s — AB (ref 11.6–15.2)

## 2015-05-17 MED ORDER — CEFPODOXIME PROXETIL 200 MG PO TABS: 200.0000 mg | ORAL_TABLET | Freq: Two times a day (BID) | ORAL | Status: DC

## 2015-05-17 MED ORDER — WARFARIN SODIUM 7.5 MG PO TABS
7.5000 mg | ORAL_TABLET | Freq: Once | ORAL | Status: DC
Start: 2015-05-17 — End: 2015-05-17

## 2015-05-17 NOTE — Progress Notes (Signed)
ANTICOAGULATION CONSULT NOTE - Follow-Up  Pharmacy Consult for Warfarin Indication: atrial fibrillation  No Known Allergies  Patient Measurements: Height: 5\' 6"  (167.6 cm) Weight: 184 lb 4.9 oz (83.6 kg) IBW/kg (Calculated) : 63.8  Vital Signs: Temp: 97.5 F (36.4 C) (02/20 0740) Temp Source: Oral (02/20 0740) BP: 121/64 mmHg (02/20 0740) Pulse Rate: 68 (02/20 0740)  Labs:  Recent Labs  05/15/15 1252 05/15/15 1352 05/16/15 0322 05/17/15 0536  HGB 9.3*  --  9.1*  --   HCT 30.4*  --  30.4*  --   PLT 267  --  270  --   LABPROT  --  30.1* 27.6* 21.8*  INR  --  2.93* 2.61* 1.91*  CREATININE 1.00  --  0.90  --   TROPONINI <0.03  --   --   --     Estimated Creatinine Clearance: 70.8 mL/min (by C-G formula based on Cr of 0.9).   Scheduled:  . aspirin EC  81 mg Oral Daily  . cefTRIAXone (ROCEPHIN)  IV  1 g Intravenous Daily  . diltiazem  120 mg Oral Daily  . ferrous sulfate  325 mg Oral Q breakfast  . flecainide  225 mg Oral BID  . insulin aspart  0-5 Units Subcutaneous QHS  . insulin aspart  0-9 Units Subcutaneous TID WC  . loratadine  10 mg Oral QPM  . multivitamin with minerals  1 tablet Oral QPM  . pantoprazole  40 mg Oral QPM  . pravastatin  40 mg Oral QPM  . sodium chloride flush  3 mL Intravenous Q12H  . sucralfate  1 g Oral TID WC & HS  . tamsulosin  0.4 mg Oral BH-q7a  . Warfarin - Pharmacist Dosing Inpatient   Does not apply q1800    Assessment: 75 yo male presents with syncopal episode. Pharmacy is consulted to dose warfarin for atrial fibrillation. INR today has dropped to 1.91.  No missed doses noted.  PTA Warfarin Dose: 5mg  MWF and 7.5mg  STTS  Goal of Therapy:  INR 2-3 Monitor platelets by anticoagulation protocol: Yes   Plan:  Repeat Warfarin 7.5mg  tonight x1 Daily INR  [employer], Pharm.D., BCPS Clinical Pharmacist Pager [phone] 05/17/2015 11:14 AM

## 2015-05-17 NOTE — Discharge Summary (Addendum)
[name], is a 75 y.o. male  DOB 03/11/1941  MRN [id number].  Admission date:  05/15/2015  Admitting Physician  Debbe Odea, MD  Discharge Date:  05/17/2015   Primary MD  [name], MD  Recommendations for primary care physician for things to follow:   Monitor INR, CBC, BMP closely.  Repeat UA in a week, follow final urine culture results obtained this admission.    Admission Diagnosis  Syncope [R55]   Discharge Diagnosis  Syncope [R55]    Principal Problem:   Syncope Active Problems:   Essential hypertension   Chronic diastolic congestive heart failure (HCC)   Coronary artery disease, non-occlusive   PAF (paroxysmal atrial fibrillation), maintaining SR   History of CVA (cerebrovascular accident)   Cervical disc disease   Abnormal urinalysis   Anemia      Past Medical History  Diagnosis Date  . Atrial flutter ([place])     a. 07/2010 Status post caval tricuspid isthmus ablation by Dr. Midge Aver Metoprolol daily  . PAF (paroxysmal atrial fibrillation) ([place])     a. Recurrent after atrial flutter, currently controlled on flecainide plus diltiazem  . Hyperlipidemia     takes Pravastatin daily  . Diastolic CHF, chronic ([place])     a. 12/2012 EF 55-60%, diast dysfxn, triv MR, mildly dil LA/RA.  [name] Coronary artery disease, non-occlusive     a. 03/2010 Nonocclusive disease by cath, performed for ST elevations on ECG;  b. 06/2013 Lexi MV: EF 60%, no ischemia.  . Cervical radiculopathy due to degenerative joint disease of spine   . HTN (hypertension)     takes Diltiazem daily  . Dysrhythmia     HX OF ATRIAL IFB /FLUTTER takes Flecanide and Coumadin daily  . History of bronchitis     1998  . Weakness     numbness and tingling both hands  . Joint pain   . History of gastric ulcer   . Anemia    takes Ferrous Sulfate daily  . History of blood transfusion 1982    "when I had stomach OR"  . Balance problem 01/2014  . Diabetes mellitus type II     takes Metformin and Lantus daily  . Pneumonia 1999  . CAP (community acquired pneumonia) 09/18/2014  . Arthritis     "all over"  . COPD (chronic obstructive pulmonary disease) [facility])     Past Surgical History  Procedure Laterality Date  . Atrial ablation surgery  08/05/10    CTI ablation for atrial flutter by JA  . Cardioversion  12/07/2010     Successful direct current cardioversion with atrial fibrillation to normal sinus rhythm  . Partial gastrectomy  1982    subtotal; "took out 30% for ulcers"  . Nm myoview ltd  07/22/2013    Normal EF ~60%, no ischemia or infarction.  . Colonoscopy [name] 12/02/2013    Procedure: COLONOSCOPY;  Surgeon: Irene Shipper, MD;  Location: [facility];  Service: Endoscopy;  Laterality: [name];  . Cardiac catheterization  2012  nl LV function, no occlusive CAD, PAF  . Knee bursectomy Left 1998  . Incision and drainage abscess / hematoma of bursa / knee / thigh Left 1998    knee  . Cataract extraction w/ intraocular lens  implant, bilateral Bilateral   . Yag laser application Bilateral   . Carpal tunnel release Bilateral 01/30/2014    Procedure: BILATERAL CARPAL TUNNEL RELEASE;  Surgeon: Marianna Payment, MD;  Location: [facility];  Service: Orthopedics;  Laterality: Bilateral;  . Transthoracic echocardiogram  02/16/2014    EF 60%, no RWMA. - otherwise normal  . Laparoscopic cholecystectomy  03/2010  . Esophagogastroduodenoscopy [name] 09/22/2014    Procedure: ESOPHAGOGASTRODUODENOSCOPY (EGD);  Surgeon: Ronald Lobo, MD;  Location: [facility] ENDOSCOPY;  Service: Endoscopy;  Laterality: [name];       HPI  from the history and physical done on the day of admission:    This is a 75 year old male patient who has a history of nonobstructive CAD by cardiac catheterization in 2012 with subsequently responded to April 2015,  history of atrial flutter post-ablation May 2012 followed by DC CV September 2012 for recurrent PAF maintaining sinus rhythm on combination of beta blockers, calcium channel blockers and flecainide. He also is on Coumadin. He was last admitted by cardiology in December 2012 for noncardiac chest pain. He also carries a history of hypertension, chronic diastolic congestive heart failure, history of prior CVA, legally blind, dyslipidemia, and ongoing tobacco abuse. Patient also has history of chronic neck pain and underwent MRI in October 2016 which demonstrated severe cervical stenosis. Patient was apparently referred to Dr. Cyndy Freeze of neurosurgery but apparently was reluctant to undergo surgical procedure. Patient reports that last night he was having difficulty sleeping because of severe neck pain.   Today he had a witnessed syncopal event while sitting in the car next to his daughter. The last thing the patient recalls was getting into the car and the next thing he remembers was waking up beside his daughter who was on the phone. Daughter reports that patient became unresponsive and had myoclonic jerking of the right side without any incontinence of bowel or bladder. Patient immediately awakened from this episode. Patient has not had any new medications initiated and has not been sick with any kind of upper respiratory symptoms, nausea vomiting diarrhea. He reports feeling weak 1 month and he suspects this may be related to his chronic anemia. Patient has not had any awareness of any chest pain chest pressure this evening on exertion or palpitations.     Hospital Course:     1. Syncope. This happened while he was sitting in the car, when he arrived he was hypotensive and syncope likely was due to that, he does have history of atrial fibrillation on Flecanide also has chronic neck pain and headache.   He was hydrated, currently blood pressure stable and he is negative for orthostatic hypotension, nonacute  MRI of the head, we also checked EEG as he had myoclonic jerks on the right side when he passed out, EEG was negative he likely had myoclonic jerks after the syncopal episode. Since he has history of atrial fibrillation and is on Flecanide we requested cardiology to evaluate, his QTC was 447 ms. per cardiology no further inpatient workup, he will get 30 day event monitor upon discharge and follow with cardiology post discharge along with PCP  He was seen by PT, cleared for home discharge, does not have any further syncopal episodes with activity, blood pressure is much better  after beta blocker was discontinued by cardiology due to low blood pressures.   2. Chronic C-spine DJD. Has seen Dr. Cyndy Freeze neurosurgeon in the past, surgery was recommended but patient refused. Outpatient follow-up with neurosurgery.  3. Paroxysmal atrial fibrillation [place] vasc 2 score greater than 5. He is on beta blocker, diltiazem along with Flecanide, on Coumadin pharmacy monitoring. Cardiology requested to evaluate. Toprol stopped by cards due to soft BP.  4. Essential hypertension. Stable on comminution of beta blocker and Cardizem.  5. Chronic diastolic CHF. Last EF of 55%. Currently compensated monitor.  6. History of CVA. No acute issues. Currently on aspirin and statin for secondary prevention continue. MRI nonacute.  7. UTI. Placed on Vantin upon discharge. Follow final urine culture results which are pending. Clinically UTI resolved no dysuria.   8. DM type II. Commence home regimen and changed.    Lab Results  Component Value Date   HGBA1C 6.0* 05/16/2015    CBG (last 3)   Recent Labs  05/16/15 2243 05/17/15 0719 05/17/15 1207  GLUCAP 162* 161* 159*      Discharge Condition: Stable  Follow UP  Follow-up Information    Follow up with BOUSKA,DAVID E, MD. Schedule an appointment as soon as possible for a visit in 3 days.   Specialty:  Family Medicine   Contact information:   [place] [address] [phone]       Follow up with Leonie Man, MD. Schedule an appointment as soon as possible for a visit in 1 week.   Specialty:  Cardiology   Contact information:   [address] [place] [address] [phone]       Follow up with Kevan Ny Ditty, MD. Schedule an appointment as soon as possible for a visit in 1 week.   Specialty:  Neurosurgery   Contact information:   [address] [place] [address] [phone]        Consults obtained - Cards  Diet and Activity recommendation: See Discharge Instructions below  Discharge Instructions           Discharge Instructions    Diet - low sodium heart healthy    Complete by:  As directed      Discharge instructions    Complete by:  As directed   Follow with Primary MD BOUSKA,DAVID E, MD in 3 days   Get CBC, CMP, INR,   checked  by Primary MD next visit.    Activity: As tolerated with Full fall precautions use walker/cane & assistance as needed   Disposition Home     Diet:   Heart Healthy  .  For Heart failure patients - Check your Weight same time everyday, if you gain over 2 pounds, or you develop in leg swelling, experience more shortness of breath or chest pain, call your Primary MD immediately. Follow Cardiac Low Salt Diet and 1.5 lit/day fluid restriction.   On your next visit with your primary care physician please Get Medicines reviewed and adjusted.   Please request your Prim.MD to go over all Hospital Tests and Procedure/Radiological results at the follow up, please get all Hospital records sent to your Prim MD by signing hospital release before you go home.   If you experience worsening of your admission symptoms, develop shortness of breath, life threatening emergency, suicidal or homicidal thoughts you must seek medical attention immediately by calling 911 or calling your MD immediately  if  symptoms less severe.  You Must  read complete instructions/literature along with all the possible adverse reactions/side effects for all the Medicines you take and that have been prescribed to you. Take any new Medicines after you have completely understood and accpet all the possible adverse reactions/side effects.   Do not drive, operating heavy machinery, perform activities at heights, swimming or participation in water activities or provide baby sitting services if your were admitted for syncope or siezures until you have seen by Primary MD or a Neurologist and advised to do so again.  Do not drive when taking Pain medications.    Do not take more than prescribed Pain, Sleep and Anxiety Medications  Special Instructions: If you have smoked or chewed Tobacco  in the last 2 yrs please stop smoking, stop any regular Alcohol  and or any Recreational drug use.  Wear Seat belts while driving.   Please note  You were cared for by a hospitalist during your hospital stay. If you have any questions about your discharge medications or the care you received while you were in the hospital after you are discharged, you can call the unit and asked to speak with the hospitalist on call if the hospitalist that took care of you is not available. Once you are discharged, your primary care physician will handle any further medical issues. Please note that NO REFILLS for any discharge medications will be authorized once you are discharged, as it is imperative that you return to your primary care physician (or establish a relationship with a primary care physician if you do not have one) for your aftercare needs so that they can reassess your need for medications and monitor your lab values.     Increase activity slowly    Complete by:  As directed              Discharge Medications       Medication List    STOP taking these medications        metoprolol succinate 25 MG 24 hr tablet  Commonly known as:  TOPROL XL      TAKE these  medications        acetaminophen 650 MG CR tablet  Commonly known as:  TYLENOL  Take 650 mg by mouth 2 (two) times daily as needed for pain.     acetaminophen 500 MG tablet  Commonly known as:  TYLENOL  Take 1 tablet (500 mg total) by mouth 2 (two) times daily.     albuterol 108 (90 Base) MCG/ACT inhaler  Commonly known as:  PROVENTIL HFA;VENTOLIN HFA  Inhale 2 puffs into the lungs every 4 (four) hours as needed for wheezing or shortness of breath.     aspirin EC 81 MG tablet  Take 81 mg by mouth daily.     cefpodoxime 200 MG tablet  Commonly known as:  VANTIN  Take 1 tablet (200 mg total) by mouth 2 (two) times daily.     diltiazem 120 MG 24 hr capsule  Commonly known as:  CARDIZEM CD  Take 1 capsule (120 mg total) by mouth daily.     ferrous sulfate 325 (65 FE) MG tablet  Take 325 mg by mouth daily with breakfast.     flecainide 150 MG tablet  Commonly known as:  TAMBOCOR  Take 1.5 tablets (225 mg total) by mouth 2 (two) times daily.     HYDROcodone-acetaminophen 5-325 MG tablet  Commonly known as:  NORCO/VICODIN  Take 0.5 tablets by  mouth 3 (three) times daily as needed for moderate pain.     hydrocortisone cream 1 %  Apply 1 application topically 2 (two) times daily as needed for itching.     LANTUS SOLOSTAR 100 UNIT/ML Solostar Pen  Generic drug:  Insulin Glargine  Inject 2-3 Units as directed 3 (three) times daily.     loratadine 10 MG tablet  Commonly known as:  CLARITIN  Take 10 mg by mouth every evening.     metFORMIN 1000 MG tablet  Commonly known as:  GLUCOPHAGE  Take 500 mg by mouth 2 (two) times daily with a meal.     methocarbamol 750 MG tablet  Commonly known as:  ROBAXIN  Take 750 mg by mouth every 6 (six) hours as needed for muscle spasms.     multivitamin with minerals Tabs tablet  Take 1 tablet by mouth every evening.     nitroGLYCERIN 0.4 MG SL tablet  Commonly known as:  NITROSTAT  Place 1 tablet (0.4 mg total) under the tongue every 5  (five) minutes x 3 doses as needed for chest pain.     pantoprazole 40 MG tablet  Commonly known as:  PROTONIX  Take 1 tablet (40 mg total) by mouth daily.     pravastatin 40 MG tablet  Commonly known as:  PRAVACHOL  TAKE 1 TABLET EVERY EVENING     tamsulosin 0.4 MG Caps capsule  Commonly known as:  FLOMAX  Take 0.4 mg by mouth every morning.     warfarin 5 MG tablet  Commonly known as:  COUMADIN  TAKE 1 TO 1 AND 1/2 TABLETS DAILY AS DIRECTED        Major procedures and Radiology Reports - PLEASE review detailed and final reports for all details, in brief -   EEG  This is a normal EEG for the patients stated age. There were no focal, hemispheric or lateralizing features. No epileptiform activity was recorded. A normal EEG does not exclude the diagnosis of a seizure disorder and if seizure remains high on the list of differential diagnosis, an ambulatory EEG may be of value. Clinical correlation is required.   Dg Chest 2 View  05/15/2015  CLINICAL DATA:  Cough for 3 weeks EXAM: CHEST  2 VIEW COMPARISON:  03/10/2015 FINDINGS: Lungs are under aerated and grossly clear. Normal heart size. No pneumothorax. No pleural effusion. IMPRESSION: No active cardiopulmonary disease. Electronically Signed   By: Marybelle Killings M.D.   On: 05/15/2015 14:22   Ct Head Wo Contrast  05/15/2015  CLINICAL DATA:  Syncopal episode, hypertension, possible seizure EXAM: CT HEAD WITHOUT CONTRAST TECHNIQUE: Contiguous axial images were obtained from the base of the skull through the vertex without contrast. COMPARISON:  05/31/2014 FINDINGS: Stable brain atrophy and chronic white matter microvascular ischemic changes about the ventricles throughout both cerebral hemispheres. No acute intracranial hemorrhage, mass lesion, definite infarction, focal mass effect or edema. No hydrocephalus, midline shift, herniation, or extra-axial fluid collection. Cisterns are patent. Mild cerebellar atrophy as well. Orbits are  symmetric. Mastoids and sinuses remain clear. No skull abnormality. IMPRESSION: Stable atrophy and chronic white matter microvascular ischemic changes. No interval change or acute process by noncontrast CT. Electronically Signed   By: Jerilynn Mages.  Shick M.D.   On: 05/15/2015 14:12   Mr Brain Wo Contrast  05/17/2015  CLINICAL DATA:  Initial evaluation for syncope. EXAM: MRI HEAD WITHOUT CONTRAST TECHNIQUE: Multiplanar, multiecho pulse sequences of the brain and surrounding structures were obtained without intravenous contrast. COMPARISON:  Prior CT from 05/15/2015. FINDINGS: Study mildly degraded by motion artifact. Diffuse prominence of the CSF containing spaces consistent with generalized age-related cerebral atrophy. Patchy T2/FLAIR hyperintensity within the periventricular and deep white matter both cerebral hemispheres most consistent with chronic small vessel ischemic disease, mild to moderate in nature. Small remote lacunar infarct within the right corona radiata. Additional small remote lacunar infarct within the left midbrain. No abnormal foci of restricted diffusion to suggest acute intracranial infarct. Major intracranial vascular flow voids are maintained. No acute or chronic intracranial hemorrhage. No mass lesion, midline shift, or mass effect. Ventricular prominence related to global parenchymal volume loss present without hydrocephalus. No extra-axial fluid collection. Major dural sinuses are patent. Craniocervical junction within normal limits. Pituitary gland within normal limits. No acute abnormality about the orbits. Sequela prior bilateral lens extraction noted. Paranasal sinuses are clear. No mastoid effusion. Inner ear structures grossly normal. Bone marrow signal intensity within normal limits. No scalp soft tissue abnormality. IMPRESSION: 1. No acute intracranial process. 2. Generalized age-related cerebral atrophy with mild to moderate chronic small vessel ischemic disease. Electronically Signed    By: Jeannine Boga M.D.   On: 05/17/2015 01:54    Micro Results      Recent Results (from the past 240 hour(s))  Urine culture     Status: None (Preliminary result)   Collection Time: 05/15/15  1:47 PM  Result Value Ref Range Status   Specimen Description URINE, CLEAN CATCH  Final   Special Requests NONE  Final   Culture 90,000 COLONIES/ml GRAM NEGATIVE RODS  Final   Report Status PENDING  Incomplete       Today   Subjective    Baine Gabel today has no headache,no chest abdominal pain,no new weakness tingling or numbness, feels much better wants to go home today.     Objective   Blood pressure 115/60, pulse 76, temperature 97.5 F (36.4 C), temperature source Oral, resp. rate 18, height 5\' 6"  (1.676 m), weight 83.6 kg (184 lb 4.9 oz), SpO2 95 %.   Intake/Output Summary (Last 24 hours) at 05/17/15 1346 Last data filed at 05/17/15 1239  Gross per 24 hour  Intake    840 ml  Output    575 ml  Net    265 ml    Exam Awake Alert, Oriented x 3, No new F.N deficits, Normal affect [place].AT,PERRAL Supple Neck,No JVD, No cervical lymphadenopathy appriciated.  Symmetrical Chest wall movement, Good air movement bilaterally, CTAB RRR,No Gallops,Rubs or new Murmurs, No Parasternal Heave +ve B.Sounds, Abd Soft, Non tender, No organomegaly appriciated, No rebound -guarding or rigidity. No Cyanosis, Clubbing or edema, No new Rash or bruise   Data Review   CBC w Diff:  Lab Results  Component Value Date   WBC 6.0 05/16/2015   HGB 9.1* 05/16/2015   HCT 30.4* 05/16/2015   HCT 29.7* 09/19/2014   PLT 270 05/16/2015   LYMPHOPCT 16 05/15/2015   MONOPCT 10 05/15/2015   EOSPCT 2 05/15/2015   BASOPCT 0 05/15/2015    CMP:  Lab Results  Component Value Date   NA 140 05/16/2015   K 3.8 05/16/2015   CL 106 05/16/2015   CO2 25 05/16/2015   BUN 13 05/16/2015   CREATININE 0.90 05/16/2015   PROT 5.3* 05/15/2015   ALBUMIN 2.7* 05/15/2015   BILITOT 0.3 05/15/2015    ALKPHOS 68 05/15/2015   AST 32 05/15/2015   ALT 88* 05/15/2015  . Lab Results  Component Value Date   INR  1.91* 05/17/2015   INR 2.61* 05/16/2015   INR 2.93* 05/15/2015     Total Time in preparing paper work, data evaluation and todays exam - 35 minutes  Thurnell Lose M.D on 05/17/2015 at [facility]   Office  [phone]

## 2015-05-17 NOTE — Care Management Obs Status (Signed)
[facility] NOTIFICATION   Patient Details  Name: [name] MRN: [id number] Date of Birth: 1940/06/25   Medicare Observation Status Notification Given:  Yes (sycope)    Bethena Roys, RN 05/17/2015, 1:43 PM

## 2015-05-17 NOTE — Procedures (Signed)
HPI:  He was admitted 05/15/15 after a syncopal spell. Pt says he was waiting for his daughter outside [place] smoking a cigarette. He went to he car and his daughter said he was found not responsive. The pt says he has felt weak for the past few days. His B/P was low on adm. No history of tachycardia, nausea or vomiting, black stool or GI   TECHNICAL SUMMARY:  A multichannel referential and bipolar montage EEG using the standard international 10-20 system was performed on the patient described as awake, drowsy and asleep.  The dominant background activity consists of 9-10 hertz activity seen most prominantly over the posterior head region.  The backgound activity is reactive to eye opening and closing procedures.  Low voltage fast (beta) activity is distributed symmetrically and maximally over the anterior head regions.  ACTIVATION:  Stepwise photic stimulation at 4-20 flashes per second was performed and did not elicit any abnormal waveforms.  Hyperventilation was performed  EPILEPTIFORM ACTIVITY:  There were no spikes, sharp waves or paroxysmal activity.  SLEEP:  Physiologic drowsiness as well as stage II sleep was noted.   IMPRESSION:  This is a normal EEG for the patients stated age.  There were no focal, hemispheric or lateralizing features.  No epileptiform activity was recorded.  A normal EEG does not exclude the diagnosis of a seizure disorder and if seizure remains high on the list of differential diagnosis, an ambulatory EEG may be of value.  Clinical correlation is required.

## 2015-05-17 NOTE — Progress Notes (Signed)
EEG completed; results pending.    

## 2015-05-17 NOTE — Progress Notes (Signed)
Subjective:  No further syncope  Objective:  Vital Signs in the last 24 hours: Temp:  [97.4 F (36.3 C)-98.5 F (36.9 C)] 97.5 F (36.4 C) (02/20 0740) Pulse Rate:  [64-84] 68 (02/20 0740) Resp:  [18] 18 (02/20 0740) BP: (106-127)/(44-67) 121/64 mmHg (02/20 0740) SpO2:  [95 %-98 %] 98 % (02/20 0740) Weight:  [184 lb 4.9 oz (83.6 kg)] 184 lb 4.9 oz (83.6 kg) (02/20 0500)  Intake/Output from previous day:  Intake/Output Summary (Last 24 hours) at 05/17/15 1032 Last data filed at 05/17/15 0830  Gross per 24 hour  Intake    360 ml  Output    775 ml  Net   -415 ml    Physical Exam: General appearance: alert, cooperative and no distress Lungs: clear to auscultation bilaterally Heart: regular rate and rhythm Neurologic: Grossly normal   Rate: 68  Rhythm: normal sinus rhythm and 1st degree AVB, PVCs  Lab Results:  Recent Labs  05/15/15 1252 05/16/15 0322  WBC 7.6 6.0  HGB 9.3* 9.1*  PLT 267 270    Recent Labs  05/15/15 1252 05/16/15 0322  NA 135 140  K 4.0 3.8  CL 101 106  CO2 23 25  GLUCOSE 145* 160*  BUN 16 13  CREATININE 1.00 0.90    Recent Labs  05/15/15 1252  TROPONINI <0.03    Recent Labs  05/17/15 0536  INR 1.91*    Scheduled Meds: . aspirin EC  81 mg Oral Daily  . cefTRIAXone (ROCEPHIN)  IV  1 g Intravenous Daily  . diltiazem  120 mg Oral Daily  . ferrous sulfate  325 mg Oral Q breakfast  . flecainide  225 mg Oral BID  . insulin aspart  0-5 Units Subcutaneous QHS  . insulin aspart  0-9 Units Subcutaneous TID WC  . loratadine  10 mg Oral QPM  . multivitamin with minerals  1 tablet Oral QPM  . pantoprazole  40 mg Oral QPM  . pravastatin  40 mg Oral QPM  . sodium chloride flush  3 mL Intravenous Q12H  . sucralfate  1 g Oral TID WC & HS  . tamsulosin  0.4 mg Oral BH-q7a  . Warfarin - Pharmacist Dosing Inpatient   Does not apply q1800   Continuous Infusions:  PRN Meds:.ibuprofen, methocarbamol   Imaging: Imaging results have  been reviewed   Assessment/Plan:  75 y.o.Caucasian male followed by Dr [name] with a history of mildCAD (20% LAD) by cath in 2012, low risk Myoview in April 2015, s/p flutter ablation May 2012 followed by DCCV Sept 2012 for PAF. He has maintained NSR on BB, CCB & flecainide. He is on chronic Coumadin followed in our office. He has had multiple ED visits for chest pain. He was seen in the ED at [facility] 03/10/15 for localized Lt lateral chest pain. Troponin's were negative x 3. Echo done showed normal LVF- EF 50-55%. He was admitted 05/15/15 after a syncopal spell. Pt says he was waiting for his daughter outside [place] smoking a cigarette. He went to he car and his daughter said he was found not responsive. The pt says he has felt weak for the past few days. His B/P was low on adm. No history of tachycardia, nausea or vomiting, black stool or GI bleeding.   Principal Problem:  Syncope-? Secondary to hypotension  Active Problems: Coronary artery disease, non-occlusive 20% LAD in 2012, low risk Myoview April 2015  PAF (paroxysmal atrial fibrillation), maintaining SR Hx of prior RFA  for flutter and DCCV for PAF. Maintaining NSR on Flecainide  Essential hypertension Controlled  Type 2 diabetes mellitus (HCC) On oral agents  Long term current use of anticoagulant therapy Coumadin- CHADS VASC=6   Type II or unspecified type diabetes mellitus without mention of complication, uncontrolled On oral agents  Anticoagulation goal of INR 2 to 3, for PAF  CHADS VASC=6    PLAN:  Beta blocker stopped.   Kerin Ransom PA-C 05/17/2015, 10:32 AM [phone]  Patient seen and examined. I agree with the assessment and plan as detailed above. See also my additional thoughts below.   We will assess the patient during the day today to see how he does with blood pressure stabilized.  All muscle Dola Argyle, MD, [facility] 05/17/2015 11:03 AM

## 2015-05-17 NOTE — Discharge Instructions (Signed)
Follow with Primary MD BOUSKA,DAVID E, MD in 3 days   Get CBC, CMP, INR checked  by Primary MD next visit.    Activity: As tolerated with Full fall precautions use walker/cane & assistance as needed   Disposition Home     Diet:   Heart Healthy  .  For Heart failure patients - Check your Weight same time everyday, if you gain over 2 pounds, or you develop in leg swelling, experience more shortness of breath or chest pain, call your Primary MD immediately. Follow Cardiac Low Salt Diet and 1.5 lit/day fluid restriction.   On your next visit with your primary care physician please Get Medicines reviewed and adjusted.   Please request your Prim.MD to go over all Hospital Tests and Procedure/Radiological results at the follow up, please get all Hospital records sent to your Prim MD by signing hospital release before you go home.   If you experience worsening of your admission symptoms, develop shortness of breath, life threatening emergency, suicidal or homicidal thoughts you must seek medical attention immediately by calling 911 or calling your MD immediately  if symptoms less severe.  You Must read complete instructions/literature along with all the possible adverse reactions/side effects for all the Medicines you take and that have been prescribed to you. Take any new Medicines after you have completely understood and accpet all the possible adverse reactions/side effects.   Do not drive, operating heavy machinery, perform activities at heights, swimming or participation in water activities or provide baby sitting services if your were admitted for syncope or siezures until you have seen by Primary MD or a Neurologist and advised to do so again.  Do not drive when taking Pain medications.    Do not take more than prescribed Pain, Sleep and Anxiety Medications  Special Instructions: If you have smoked or chewed Tobacco  in the last 2 yrs please stop smoking, stop any regular Alcohol  and  or any Recreational drug use.  Wear Seat belts while driving.   Please note  You were cared for by a hospitalist during your hospital stay. If you have any questions about your discharge medications or the care you received while you were in the hospital after you are discharged, you can call the unit and asked to speak with the hospitalist on call if the hospitalist that took care of you is not available. Once you are discharged, your primary care physician will handle any further medical issues. Please note that NO REFILLS for any discharge medications will be authorized once you are discharged, as it is imperative that you return to your primary care physician (or establish a relationship with a primary care physician if you do not have one) for your aftercare needs so that they can reassess your need for medications and monitor your lab values.

## 2015-05-18 ENCOUNTER — Other Ambulatory Visit: Payer: Self-pay | Admitting: Physician Assistant

## 2015-05-18 DIAGNOSIS — I480 Paroxysmal atrial fibrillation: Secondary | ICD-10-CM

## 2015-05-18 DIAGNOSIS — R55 Syncope and collapse: Secondary | ICD-10-CM

## 2015-05-18 LAB — URINE CULTURE: Culture: 90000

## 2015-05-20 ENCOUNTER — Ambulatory Visit (INDEPENDENT_AMBULATORY_CARE_PROVIDER_SITE_OTHER): Payer: Medicare Other

## 2015-05-20 DIAGNOSIS — I480 Paroxysmal atrial fibrillation: Secondary | ICD-10-CM

## 2015-05-20 DIAGNOSIS — R55 Syncope and collapse: Secondary | ICD-10-CM | POA: Diagnosis not present

## 2015-05-22 ENCOUNTER — Encounter (HOSPITAL_COMMUNITY): Payer: Self-pay | Admitting: *Deleted

## 2015-05-22 DIAGNOSIS — Z794 Long term (current) use of insulin: Secondary | ICD-10-CM | POA: Diagnosis not present

## 2015-05-22 DIAGNOSIS — R0602 Shortness of breath: Secondary | ICD-10-CM | POA: Diagnosis present

## 2015-05-22 DIAGNOSIS — E785 Hyperlipidemia, unspecified: Secondary | ICD-10-CM | POA: Insufficient documentation

## 2015-05-22 DIAGNOSIS — Z7901 Long term (current) use of anticoagulants: Secondary | ICD-10-CM | POA: Diagnosis not present

## 2015-05-22 DIAGNOSIS — Z792 Long term (current) use of antibiotics: Secondary | ICD-10-CM | POA: Diagnosis not present

## 2015-05-22 DIAGNOSIS — Z79899 Other long term (current) drug therapy: Secondary | ICD-10-CM | POA: Diagnosis not present

## 2015-05-22 DIAGNOSIS — Z8701 Personal history of pneumonia (recurrent): Secondary | ICD-10-CM | POA: Insufficient documentation

## 2015-05-22 DIAGNOSIS — K921 Melena: Secondary | ICD-10-CM | POA: Diagnosis not present

## 2015-05-22 DIAGNOSIS — F17210 Nicotine dependence, cigarettes, uncomplicated: Secondary | ICD-10-CM | POA: Diagnosis not present

## 2015-05-22 DIAGNOSIS — I5032 Chronic diastolic (congestive) heart failure: Secondary | ICD-10-CM | POA: Diagnosis not present

## 2015-05-22 DIAGNOSIS — E119 Type 2 diabetes mellitus without complications: Secondary | ICD-10-CM | POA: Diagnosis not present

## 2015-05-22 DIAGNOSIS — I10 Essential (primary) hypertension: Secondary | ICD-10-CM | POA: Insufficient documentation

## 2015-05-22 DIAGNOSIS — J441 Chronic obstructive pulmonary disease with (acute) exacerbation: Secondary | ICD-10-CM | POA: Diagnosis not present

## 2015-05-22 DIAGNOSIS — Z7984 Long term (current) use of oral hypoglycemic drugs: Secondary | ICD-10-CM | POA: Insufficient documentation

## 2015-05-22 DIAGNOSIS — I480 Paroxysmal atrial fibrillation: Secondary | ICD-10-CM | POA: Insufficient documentation

## 2015-05-22 DIAGNOSIS — Z7982 Long term (current) use of aspirin: Secondary | ICD-10-CM | POA: Insufficient documentation

## 2015-05-22 DIAGNOSIS — D649 Anemia, unspecified: Secondary | ICD-10-CM | POA: Diagnosis not present

## 2015-05-22 DIAGNOSIS — I4892 Unspecified atrial flutter: Secondary | ICD-10-CM | POA: Diagnosis not present

## 2015-05-22 DIAGNOSIS — T45515A Adverse effect of anticoagulants, initial encounter: Secondary | ICD-10-CM | POA: Diagnosis not present

## 2015-05-22 DIAGNOSIS — I2510 Atherosclerotic heart disease of native coronary artery without angina pectoris: Secondary | ICD-10-CM | POA: Diagnosis not present

## 2015-05-22 DIAGNOSIS — D6832 Hemorrhagic disorder due to extrinsic circulating anticoagulants: Secondary | ICD-10-CM | POA: Diagnosis not present

## 2015-05-22 DIAGNOSIS — R55 Syncope and collapse: Secondary | ICD-10-CM | POA: Diagnosis not present

## 2015-05-22 DIAGNOSIS — M1990 Unspecified osteoarthritis, unspecified site: Secondary | ICD-10-CM | POA: Insufficient documentation

## 2015-05-22 LAB — CBC
HCT: 32.5 % — ABNORMAL LOW (ref 39.0–52.0)
HEMOGLOBIN: 9.7 g/dL — AB (ref 13.0–17.0)
MCH: 27.2 pg (ref 26.0–34.0)
MCHC: 29.8 g/dL — AB (ref 30.0–36.0)
MCV: 91.3 fL (ref 78.0–100.0)
Platelets: 402 10*3/uL — ABNORMAL HIGH (ref 150–400)
RBC: 3.56 MIL/uL — ABNORMAL LOW (ref 4.22–5.81)
RDW: 12.9 % (ref 11.5–15.5)
WBC: 8.5 10*3/uL (ref 4.0–10.5)

## 2015-05-22 LAB — I-STAT TROPONIN, ED: TROPONIN I, POC: 0 ng/mL (ref 0.00–0.08)

## 2015-05-22 NOTE — ED Notes (Signed)
Pt says that around 2145 tonight, he felt faint, arms were "quivering" and weak all over. Pt says his chest "feels funny" throughout. He checked his heart rate and it was 50, pt is wearing a holter monitor per dr. Bryson Corona since yesterday.

## 2015-05-23 ENCOUNTER — Emergency Department (HOSPITAL_COMMUNITY)
Admission: EM | Admit: 2015-05-23 | Discharge: 2015-05-23 | Disposition: A | Discharge status: Home / Self Care | Payer: Medicare Other | Attending: Emergency Medicine | Admitting: Emergency Medicine

## 2015-05-23 DIAGNOSIS — D649 Anemia, unspecified: Secondary | ICD-10-CM

## 2015-05-23 DIAGNOSIS — D6832 Hemorrhagic disorder due to extrinsic circulating anticoagulants: Secondary | ICD-10-CM

## 2015-05-23 DIAGNOSIS — R195 Other fecal abnormalities: Secondary | ICD-10-CM

## 2015-05-23 DIAGNOSIS — R002 Palpitations: Secondary | ICD-10-CM

## 2015-05-23 DIAGNOSIS — T45515A Adverse effect of anticoagulants, initial encounter: Secondary | ICD-10-CM

## 2015-05-23 LAB — URINE MICROSCOPIC-ADD ON

## 2015-05-23 LAB — PROTIME-INR
INR: 3.41 — AB (ref 0.00–1.49)
PROTHROMBIN TIME: 33.7 s — AB (ref 11.6–15.2)

## 2015-05-23 LAB — CBG MONITORING, ED: GLUCOSE-CAPILLARY: 150 mg/dL — AB (ref 65–99)

## 2015-05-23 LAB — URINALYSIS, ROUTINE W REFLEX MICROSCOPIC
Bilirubin Urine: NEGATIVE
Glucose, UA: NEGATIVE mg/dL
Hgb urine dipstick: NEGATIVE
Ketones, ur: NEGATIVE mg/dL
NITRITE: NEGATIVE
PROTEIN: NEGATIVE mg/dL
SPECIFIC GRAVITY, URINE: 1.024 (ref 1.005–1.030)
pH: 5.5 (ref 5.0–8.0)

## 2015-05-23 LAB — BASIC METABOLIC PANEL
ANION GAP: 12 (ref 5–15)
BUN: 17 mg/dL (ref 6–20)
CHLORIDE: 105 mmol/L (ref 101–111)
CO2: 21 mmol/L — ABNORMAL LOW (ref 22–32)
Calcium: 8.8 mg/dL — ABNORMAL LOW (ref 8.9–10.3)
Creatinine, Ser: 1.07 mg/dL (ref 0.61–1.24)
Glucose, Bld: 164 mg/dL — ABNORMAL HIGH (ref 65–99)
POTASSIUM: 4.1 mmol/L (ref 3.5–5.1)
SODIUM: 138 mmol/L (ref 135–145)

## 2015-05-23 LAB — POC OCCULT BLOOD, ED: FECAL OCCULT BLD: POSITIVE — AB

## 2015-05-23 LAB — I-STAT TROPONIN, ED
Troponin i, poc: 0 ng/mL (ref 0.00–0.08)
Troponin i, poc: 0 ng/mL (ref 0.00–0.08)

## 2015-05-23 NOTE — Discharge Instructions (Signed)
Wear your Holter monitor so Dr. [name] will get information about your heart rhythm. Do not take your Coumadin today, your INR was 3.4. You should have your level checked either tomorrow or the following day before you start back on the Coumadin. When you see her doctor this week talk to him about your anemia and your blood in your stools. He may want you to be evaluated by a gastroenterologist. Return to the ED if you start bleeding from your rectum. Also if you get chest pain.

## 2015-05-23 NOTE — ED Notes (Signed)
Pt educated upon reasons for discharged and verbalized understanding to follow up with Dr Selena Batten on Monday. Pt was educated multiple times on the fact that his labs were reassuring and tests by Dr Tomi Bamberger and this RN. Pt stable, alert and oriented x 4, NAD upon d/c.

## 2015-05-23 NOTE — ED Provider Notes (Signed)
CSN: [id number]     Arrival date & time 05/22/15  2254 History  By signing my name below, I, Randa Evens, attest that this documentation has been prepared under the direction and in the presence of Rolland Porter, MD at [facility]. Electronically Signed: Randa Evens, ED Scribe. 05/23/2015. 12:53 AM.      Chief Complaint  Patient presents with  . Near Syncope   The history is provided by the patient. No language interpreter was used.   HPI Comments: [name] is a 75 y.o. male who presents to the Emergency Department stating tonight around 9:30 PM he was laying down in his arms got shaky and then he got a quivering in his chest. He states his daughter checked his heart rate and it was 50. He states he felt short of breath without chest pain. He states the left lateral portion of his chest was also quivering and then his shoulder started shaking.[name] He states that his symptoms resolved within 15-20 minutes.  He states that his HR is normally in the 90 range. (Please note during my exam his heart rate was in the 90s however he was having ventricular bigeminy).  [name]  He reports syncope 1 week prior while sitting in the car and he was admitted to the hospital.. States that he had CT and MRI of head. He reports  he was taken off of his metoprolol. Pt reports smoking less than a half pack of cigarettes each day. Pt states that he had a heart monitored placed yesterday. He states that he was placed on the heart monitor after being taken off of the metoprolol. Denies CP, diaphoresis, abdominal pain, nausea, blood in stool or HA. Pt does report anticoagulant use.   PCP Dr Coletta Memos Cardiology Dr [name]  Past Medical History  Diagnosis Date  . Atrial flutter ([place])     a. 07/2010 Status post caval tricuspid isthmus ablation by Dr. Midge Aver Metoprolol daily  . PAF (paroxysmal atrial fibrillation) ([place])     a. Recurrent after atrial flutter, currently controlled on flecainide plus diltiazem  . Hyperlipidemia      takes Pravastatin daily  . Diastolic CHF, chronic ([place])     a. 12/2012 EF 55-60%, diast dysfxn, triv MR, mildly dil LA/RA.  [name] Coronary artery disease, non-occlusive     a. 03/2010 Nonocclusive disease by cath, performed for ST elevations on ECG;  b. 06/2013 Lexi MV: EF 60%, no ischemia.  . Cervical radiculopathy due to degenerative joint disease of spine   . HTN (hypertension)     takes Diltiazem daily  . Dysrhythmia     HX OF ATRIAL IFB /FLUTTER takes Flecanide and Coumadin daily  . History of bronchitis     1998  . Weakness     numbness and tingling both hands  . Joint pain   . History of gastric ulcer   . Anemia     takes Ferrous Sulfate daily  . History of blood transfusion 1982    "when I had stomach OR"  . Balance problem 01/2014  . Diabetes mellitus type II     takes Metformin and Lantus daily  . Pneumonia 1999  . CAP (community acquired pneumonia) 09/18/2014  . Arthritis     "all over"  . COPD (chronic obstructive pulmonary disease) [facility])    Past Surgical History  Procedure Laterality Date  . Atrial ablation surgery  08/05/10    CTI ablation for atrial flutter by JA  . Cardioversion  12/07/2010  Successful direct current cardioversion with atrial fibrillation to normal sinus rhythm  . Partial gastrectomy  1982    subtotal; "took out 30% for ulcers"  . Nm myoview ltd  07/22/2013    Normal EF ~60%, no ischemia or infarction.  . Colonoscopy [name] 12/02/2013    Procedure: COLONOSCOPY;  Surgeon: Irene Shipper, MD;  Location: [place];  Service: Endoscopy;  Laterality: [name];  . Cardiac catheterization  2012    nl LV function, no occlusive CAD, PAF  . Knee bursectomy Left 1998  . Incision and drainage abscess / hematoma of bursa / knee / thigh Left 1998    knee  . Cataract extraction w/ intraocular lens  implant, bilateral Bilateral   . Yag laser application Bilateral   . Carpal tunnel release Bilateral 01/30/2014    Procedure: BILATERAL CARPAL TUNNEL RELEASE;   Surgeon: Marianna Payment, MD;  Location: [place];  Service: Orthopedics;  Laterality: Bilateral;  . Transthoracic echocardiogram  02/16/2014    EF 60%, no RWMA. - otherwise normal  . Laparoscopic cholecystectomy  03/2010  . Esophagogastroduodenoscopy [name] 09/22/2014    Procedure: ESOPHAGOGASTRODUODENOSCOPY (EGD);  Surgeon: Ronald Lobo, MD;  Location: [facility] ENDOSCOPY;  Service: Endoscopy;  Laterality: [name];   Family History  Problem Relation Age of Onset  . Cancer Mother   . Heart attack Father   . Heart attack Brother    Social History  Substance Use Topics  . Smoking status: Current Every Day Smoker -- 0.50 packs/day for 55 years    Types: Cigarettes  . Smokeless tobacco: Never Used     Comment: He's been smoking between 0.5 and 2 ppd since age 25.  [name] Alcohol Use: No     Comment: "quit drinking in 1986"    Review of Systems  Constitutional: Negative for diaphoresis.  Cardiovascular: Negative for chest pain.  Gastrointestinal: Negative for nausea, abdominal pain and blood in stool.  Neurological: Negative for headaches.  All other systems reviewed and are negative.    Allergies  Review of patient's allergies indicates no known allergies.  Home Medications   Prior to Admission medications   Medication Sig Start Date End Date Taking? Authorizing Provider  acetaminophen (TYLENOL) 650 MG CR tablet Take 650 mg by mouth 2 (two) times daily as needed for pain.   Yes Historical Provider, MD  aspirin EC 81 MG tablet Take 81 mg by mouth daily.   Yes Historical Provider, MD  cefpodoxime (VANTIN) 200 MG tablet Take 1 tablet (200 mg total) by mouth 2 (two) times daily. 05/17/15  Yes Thurnell Lose, MD  diltiazem (CARDIZEM CD) 120 MG 24 hr capsule Take 1 capsule (120 mg total) by mouth daily. 03/19/15  Yes Erlene Quan, PA-C  ferrous sulfate 325 (65 FE) MG tablet Take 325 mg by mouth daily with breakfast.   Yes Historical Provider, MD  flecainide (TAMBOCOR) 150 MG tablet Take 1.5 tablets  (225 mg total) by mouth 2 (two) times daily. 03/19/15  Yes Erlene Quan, PA-C  HYDROcodone-acetaminophen (NORCO/VICODIN) 5-325 MG tablet Take 0.5 tablets by mouth 3 (three) times daily as needed for moderate pain.  02/27/15  Yes Historical Provider, MD  hydrocortisone cream 1 % Apply 1 application topically 2 (two) times daily as needed for itching.  04/27/15  Yes Historical Provider, MD  LANTUS SOLOSTAR 100 UNIT/ML Solostar Pen Inject 2-3 Units as directed 3 (three) times daily.  08/31/14  Yes Historical Provider, MD  loratadine (CLARITIN) 10 MG tablet Take 10 mg by  mouth every evening.    Yes Historical Provider, MD  metFORMIN (GLUCOPHAGE) 1000 MG tablet Take 500 mg by mouth 2 (two) times daily with a meal.    Yes Historical Provider, MD  methocarbamol (ROBAXIN) 750 MG tablet Take 750 mg by mouth every 6 (six) hours as needed for muscle spasms.  04/17/15  Yes Historical Provider, MD  Multiple Vitamin (MULTIVITAMIN WITH MINERALS) TABS tablet Take 1 tablet by mouth every evening.    Yes Historical Provider, MD  nitroGLYCERIN (NITROSTAT) 0.4 MG SL tablet Place 1 tablet (0.4 mg total) under the tongue every 5 (five) minutes x 3 doses as needed for chest pain. 05/22/14  Yes Leonie Man, MD  pantoprazole (PROTONIX) 40 MG tablet Take 1 tablet (40 mg total) by mouth daily. Patient taking differently: Take 40 mg by mouth every evening.  09/24/14  Yes Thurnell Lose, MD  pravastatin (PRAVACHOL) 40 MG tablet TAKE 1 TABLET EVERY EVENING 10/26/14  Yes Leonie Man, MD  tamsulosin (FLOMAX) 0.4 MG CAPS capsule Take 0.4 mg by mouth every morning. 04/19/15  Yes Historical Provider, MD  warfarin (COUMADIN) 5 MG tablet TAKE 1 TO 1 AND 1/2 TABLETS DAILY AS DIRECTED Patient taking differently: TAKE 1 TO 1 AND 1/2 TABLETS DAILY AS DIRECTED every evening, 5 mg Mon, Wed, and Fri.  7.5 mg all other days 04/06/15  Yes Troy Sine, MD  acetaminophen (TYLENOL) 500 MG tablet Take 1 tablet (500 mg total) by mouth 2 (two) times  daily. Patient not taking: Reported on 05/15/2015 03/12/15   Domenic Moras, PA-C  albuterol (PROVENTIL HFA;VENTOLIN HFA) 108 (90 BASE) MCG/ACT inhaler Inhale 2 puffs into the lungs every 4 (four) hours as needed for wheezing or shortness of breath. Patient not taking: Reported on 05/15/2015 09/14/14   Sherwood Gambler, MD   BP 119/73 mmHg  Pulse 100  Temp(Src) 97.9 F (36.6 C) (Oral)  Resp 22  Ht 5\' 6"  (1.676 m)  Wt 190 lb (86.183 kg)  BMI 30.68 kg/m2  SpO2 96%  Vital signs normal     01:15 Orthostatic Vital Signs Orthostatic Lying  - BP- Lying: 145/74 mmHg ; Pulse- Lying: 93  Orthostatic Sitting - BP- Sitting: 155/88 mmHg ; Pulse- Sitting: 100  Orthostatic Standing at 0 minutes - BP- Standing at 0 minutes: 130/65 mmHg ; Pulse- Standing at 0 minutes: 101      Orthostatic vital signs are not significant  Physical Exam  Constitutional: He is oriented to person, place, and time. He appears well-developed and well-nourished.  Non-toxic appearance. He does not appear ill. No distress.  HENT:  Head: Normocephalic and atraumatic.  Right Ear: External ear normal.  Left Ear: External ear normal.  Nose: Nose normal. No mucosal edema or rhinorrhea.  Mouth/Throat: Oropharynx is clear and moist and mucous membranes are normal. No dental abscesses or uvula swelling.  Eyes: Conjunctivae and EOM are normal. Pupils are equal, round, and reactive to light.  Neck: Normal range of motion and full passive range of motion without pain. Neck supple.  Cardiovascular: Normal rate, regular rhythm and normal heart sounds.  Exam reveals no gallop and no friction rub.   No murmur heard. He is having short episodes of ventricular bigeminy.    Pulmonary/Chest: Effort normal and breath sounds normal. No respiratory distress. He has no wheezes. He has no rhonchi. He has no rales. He exhibits no tenderness and no crepitus.  Abdominal: Soft. Normal appearance and bowel sounds are normal. He exhibits no distension.  There is no tenderness. There is no rebound and no guarding.  Musculoskeletal: Normal range of motion. He exhibits no edema or tenderness.  Moves all extremities well.   Neurological: He is alert and oriented to person, place, and time. He has normal strength. No cranial nerve deficit.  Skin: Skin is warm, dry and intact. No rash noted. No erythema. There is pallor.  Psychiatric: He has a normal mood and affect. His speech is normal and behavior is normal. His mood appears not anxious.  Nursing note and vitals reviewed.   ED Course  Procedures (including critical care time)    DIAGNOSTIC STUDIES: Oxygen Saturation is 99% on RA, normal by my interpretation.    COORDINATION OF CARE: 12:55 AM-Discussed treatment plan with pt at bedside and pt agreed to plan.  We discussed that when his heart rate was listed as 50 it could be he was in the ventricular bigeminy pattern in which case the pressure monitor he has at home may have only counted every other beat. Patient is wearing a Holter monitor already.   3:36 AM- Discussed lab results. We discussed his INR is over therapeutic and he should hold his Coumadin for a day or so. He denies seeing any rectal bleeding or blood in his stools. He artery has an appointment of his PCP this week and he was advised to discuss the anemia and heme-positive stool with his PCP. He may need a GI referral. Patient also has an appointment with his cardiologist the following week. Pt is ready for discharge. His daughter has already gone home and took his Holter monitor with her.    Labs Review Results for orders placed or performed during the hospital encounter of [id number]  Basic metabolic panel  Result Value Ref Range   Sodium 138 135 - 145 mmol/L   Potassium 4.1 3.5 - 5.1 mmol/L   Chloride 105 101 - 111 mmol/L   CO2 21 (L) 22 - 32 mmol/L   Glucose, Bld 164 (H) 65 - 99 mg/dL   BUN 17 6 - 20 mg/dL   Creatinine, Ser 1.07 0.61 - 1.24 mg/dL   Calcium 8.8 (L)  8.9 - 10.3 mg/dL   GFR calc non Af Amer >60 >60 mL/min   GFR calc Af Amer >60 >60 mL/min   Anion gap 12 5 - 15  CBC  Result Value Ref Range   WBC 8.5 4.0 - 10.5 K/uL   RBC 3.56 (L) 4.22 - 5.81 MIL/uL   Hemoglobin 9.7 (L) 13.0 - 17.0 g/dL   HCT 32.5 (L) 39.0 - 52.0 %   MCV 91.3 78.0 - 100.0 fL   MCH 27.2 26.0 - 34.0 pg   MCHC 29.8 (L) 30.0 - 36.0 g/dL   RDW 12.9 11.5 - 15.5 %   Platelets 402 (H) 150 - 400 K/uL  Urinalysis, Routine w reflex microscopic (not at [facility])  Result Value Ref Range   Color, Urine YELLOW YELLOW   APPearance CLEAR CLEAR   Specific Gravity, Urine 1.024 1.005 - 1.030   pH 5.5 5.0 - 8.0   Glucose, UA NEGATIVE NEGATIVE mg/dL   Hgb urine dipstick NEGATIVE NEGATIVE   Bilirubin Urine NEGATIVE NEGATIVE   Ketones, ur NEGATIVE NEGATIVE mg/dL   Protein, ur NEGATIVE NEGATIVE mg/dL   Nitrite NEGATIVE NEGATIVE   Leukocytes, UA TRACE (A) NEGATIVE  Protime-INR  Result Value Ref Range   Prothrombin Time 33.7 (H) 11.6 - 15.2 seconds   INR 3.41 (H) 0.00 - 1.49  Urine microscopic-add  on  Result Value Ref Range   Squamous Epithelial / LPF 0-5 (A) NONE SEEN   WBC, UA 0-5 0 - 5 WBC/hpf   RBC / HPF 0-5 0 - 5 RBC/hpf   Bacteria, UA RARE (A) NONE SEEN  CBG monitoring, ED  Result Value Ref Range   Glucose-Capillary 150 (H) 65 - 99 mg/dL  I-Stat Troponin, ED (not at [facility])  Result Value Ref Range   Troponin i, poc 0.00 0.00 - 0.08 ng/mL   Comment 3          I-stat troponin, ED  Result Value Ref Range   Troponin i, poc 0.00 0.00 - 0.08 ng/mL   Comment 3          I-stat troponin, ED  Result Value Ref Range   Troponin i, poc 0.00 0.00 - 0.08 ng/mL   Comment 3          POC occult blood, ED RN will collect  Result Value Ref Range   Fecal Occult Bld POSITIVE (A) NEGATIVE    Laboratory interpretation all normal except stable slightly improving anemia, heme positive stool, over therapeutic INR.   Dg Chest 2 View  05/15/2015  CLINICAL DATA:  Cough for 3 weeks. IMPRESSION:  No active cardiopulmonary disease. Electronically Signed   By: Marybelle Killings M.D.   On: 05/15/2015 14:22   Ct Head Wo Contrast  05/15/2015  CLINICAL DATA:  Syncopal episode, hypertension, possible seizure IMPRESSION: Stable atrophy and chronic white matter microvascular ischemic changes. No interval change or acute process by noncontrast CT. Electronically Signed   By: Jerilynn Mages.  Shick M.D.   On: 05/15/2015 14:12   Mr Brain Wo Contrast  05/17/2015  CLINICAL DATA:  Initial evaluation for syncope. [name] IMPRESSION: 1. No acute intracranial process. 2. Generalized age-related cerebral atrophy with mild to moderate chronic small vessel ischemic disease. Electronically Signed   By: Jeannine Boga M.D.   On: 05/17/2015 01:54      Imaging Review No results found. I have personally reviewed and evaluated these images and lab results as part of my medical decision-making.   EKG Interpretation   Date/Time:  Saturday May 22 2015 23:15:59 EST Ventricular Rate:  99 PR Interval:  294 QRS Duration: 96 QT Interval:  346 QTC Calculation: 444 R Axis:   -16 Text Interpretation:  Sinus rhythm with 1st degree A-V block with frequent  Premature ventricular complexes Otherwise normal ECG Since last tracing 15 May 2015 Premature ventricular complexes are now Present Confirmed by  Larance Ratledge  MD-I, Tytiana Coles ([id number]) on 05/23/2015 12:17:21 AM      MDM   Final diagnoses:  Palpitations  Anemia, unspecified  Warfarin-induced coagulopathy ([place])  Heme positive stool    Plan discharge  Rolland Porter, MD, FACEP   I personally performed the services described in this documentation, which was scribed in my presence. The recorded information has been reviewed and considered.  Rolland Porter, MD, Barbette Or, MD 05/23/15 [phone]

## 2015-05-27 ENCOUNTER — Ambulatory Visit (INDEPENDENT_AMBULATORY_CARE_PROVIDER_SITE_OTHER): Payer: Medicare Other | Admitting: Pharmacist Clinician (PhC)/ Clinical Pharmacy Specialist

## 2015-05-27 DIAGNOSIS — I480 Paroxysmal atrial fibrillation: Secondary | ICD-10-CM

## 2015-05-27 DIAGNOSIS — Z7901 Long term (current) use of anticoagulants: Secondary | ICD-10-CM

## 2015-05-27 LAB — POCT INR: INR: 1.7

## 2015-06-01 IMAGING — CR DG CHEST 2V
2 series · 2 of 2 positions shown · non-contrast
Comparison: Chest radiograph performed 02/15/2014

CLINICAL DATA: Acute onset of shortness of breath. Initial
encounter.

EXAM:
CHEST  2 VIEW

[chest pa]
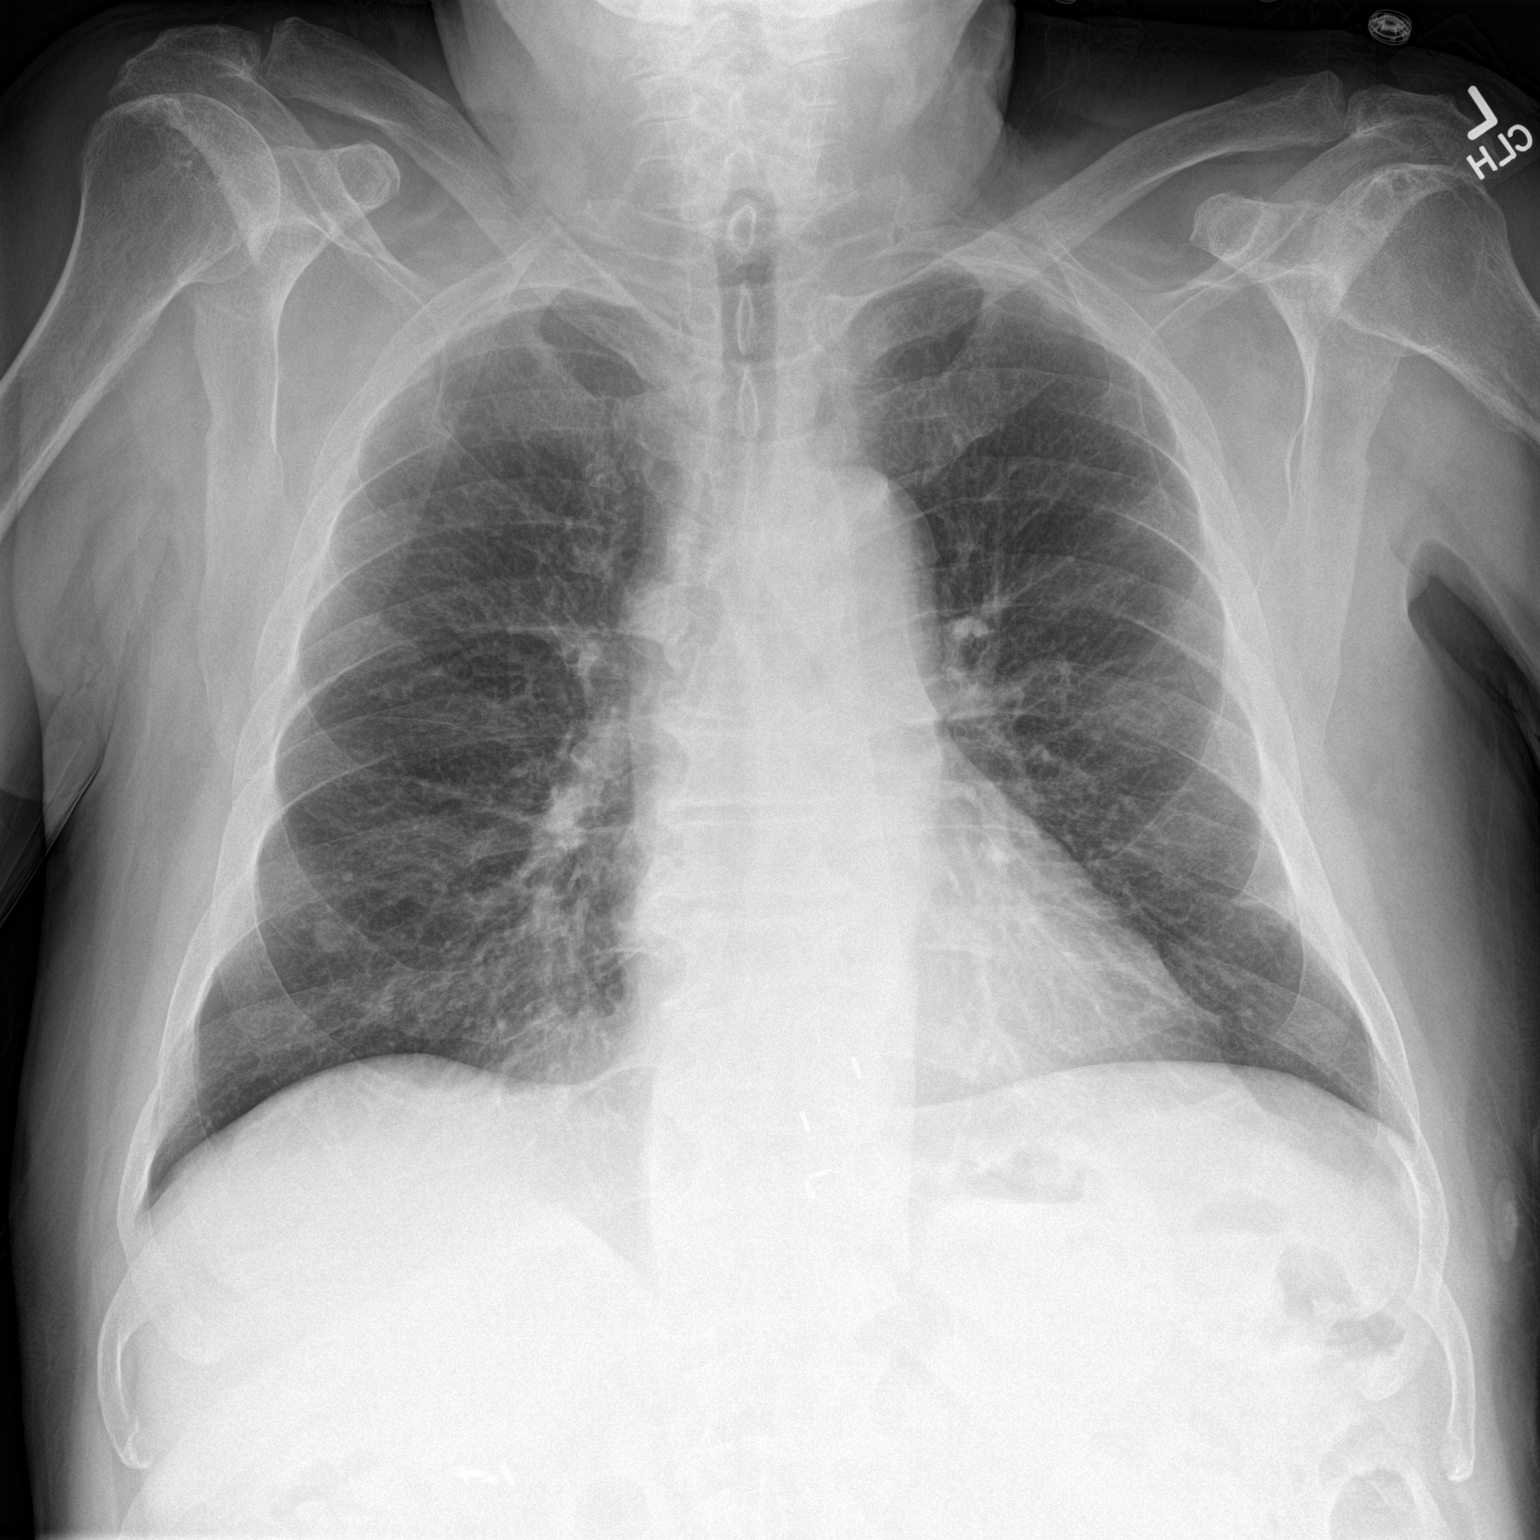

[chest lat]
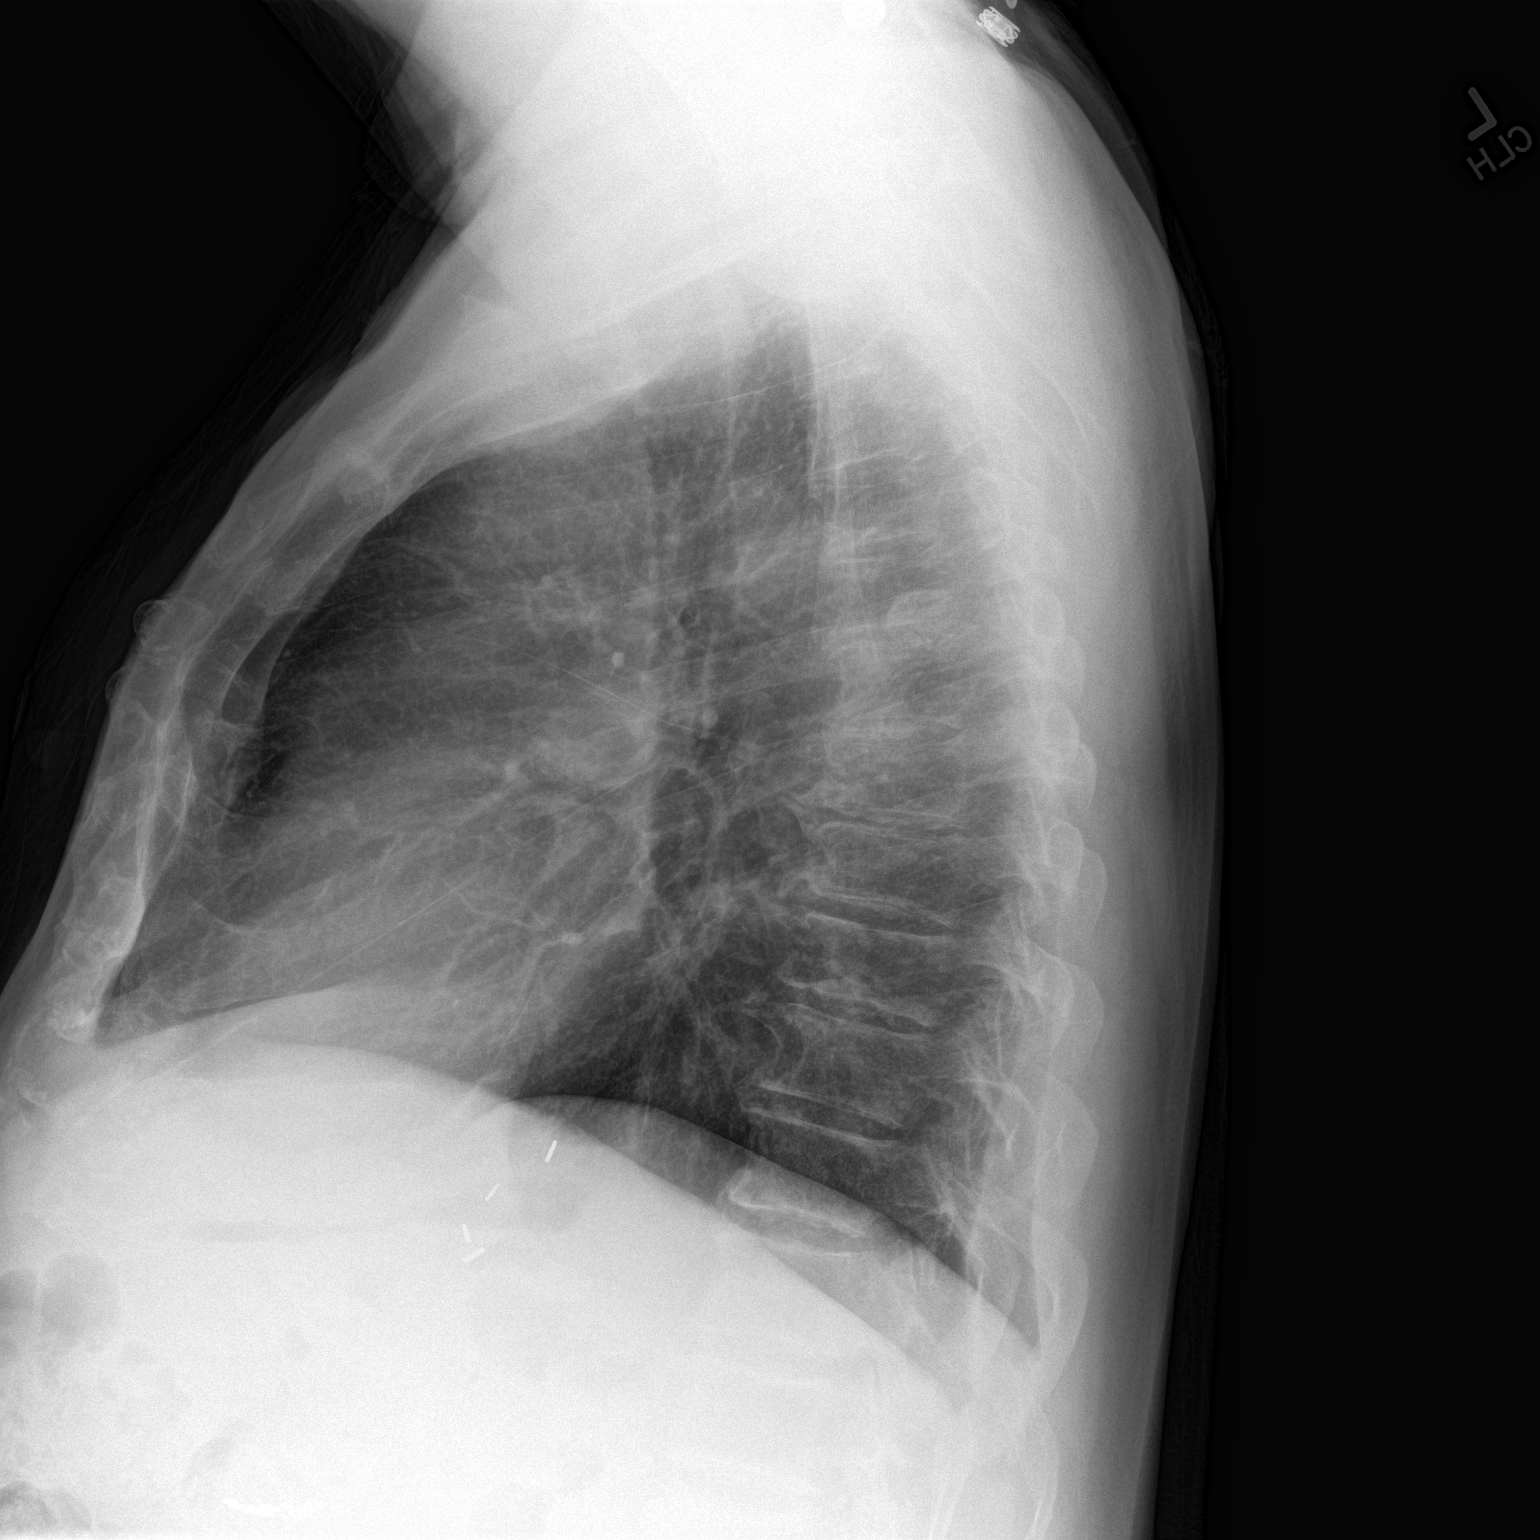

[2 of 2 positions shown; findings below may reference images not displayed]

FINDINGS: The lungs are well-aerated. Mild medial right basilar airspace
opacity may reflect atelectasis or possibly mild pneumonia. Chronic
peribronchial thickening is seen. There is no evidence of pleural
effusion or pneumothorax. Bilateral nipple shadows are seen.

The heart is normal in size; the mediastinal contour is within
normal limits. No acute osseous abnormalities are seen. Clips are
noted within the right upper quadrant, reflecting prior
cholecystectomy. Clips are also noted about the gastroesophageal
junction.
IMPRESSION: Mild medial right basilar airspace opacity may reflect atelectasis
or possibly mild pneumonia. Chronic peribronchial thickening seen.

## 2015-06-11 ENCOUNTER — Ambulatory Visit (INDEPENDENT_AMBULATORY_CARE_PROVIDER_SITE_OTHER): Payer: Medicare Other | Admitting: Pharmacist Clinician (PhC)/ Clinical Pharmacy Specialist

## 2015-06-11 DIAGNOSIS — I480 Paroxysmal atrial fibrillation: Secondary | ICD-10-CM

## 2015-06-11 DIAGNOSIS — Z7901 Long term (current) use of anticoagulants: Secondary | ICD-10-CM | POA: Diagnosis not present

## 2015-06-11 LAB — POCT INR: INR: 3.4

## 2015-06-28 ENCOUNTER — Other Ambulatory Visit: Payer: Self-pay | Admitting: Cardiology

## 2015-06-28 NOTE — Telephone Encounter (Signed)
Rx refill sent to pharmacy. 

## 2015-06-29 ENCOUNTER — Ambulatory Visit: Payer: Medicare Other | Admitting: Cardiology

## 2015-06-30 ENCOUNTER — Emergency Department (HOSPITAL_COMMUNITY)
Admission: EM | Admit: 2015-06-30 | Discharge: 2015-06-30 | Disposition: A | Discharge status: Home / Self Care | Payer: Medicare Other | Attending: Emergency Medicine | Admitting: Emergency Medicine

## 2015-06-30 ENCOUNTER — Encounter (HOSPITAL_COMMUNITY): Payer: Self-pay | Admitting: Family Medicine

## 2015-06-30 DIAGNOSIS — I5032 Chronic diastolic (congestive) heart failure: Secondary | ICD-10-CM | POA: Insufficient documentation

## 2015-06-30 DIAGNOSIS — I10 Essential (primary) hypertension: Secondary | ICD-10-CM | POA: Insufficient documentation

## 2015-06-30 DIAGNOSIS — R531 Weakness: Secondary | ICD-10-CM | POA: Insufficient documentation

## 2015-06-30 DIAGNOSIS — E119 Type 2 diabetes mellitus without complications: Secondary | ICD-10-CM | POA: Insufficient documentation

## 2015-06-30 DIAGNOSIS — F17210 Nicotine dependence, cigarettes, uncomplicated: Secondary | ICD-10-CM | POA: Diagnosis not present

## 2015-06-30 DIAGNOSIS — J449 Chronic obstructive pulmonary disease, unspecified: Secondary | ICD-10-CM | POA: Insufficient documentation

## 2015-06-30 DIAGNOSIS — I2510 Atherosclerotic heart disease of native coronary artery without angina pectoris: Secondary | ICD-10-CM | POA: Diagnosis not present

## 2015-06-30 LAB — CBC
HCT: 32 % — ABNORMAL LOW (ref 39.0–52.0)
HEMOGLOBIN: 9.1 g/dL — AB (ref 13.0–17.0)
MCH: 25.1 pg — ABNORMAL LOW (ref 26.0–34.0)
MCHC: 28.4 g/dL — ABNORMAL LOW (ref 30.0–36.0)
MCV: 88.2 fL (ref 78.0–100.0)
Platelets: 317 10*3/uL (ref 150–400)
RBC: 3.63 MIL/uL — AB (ref 4.22–5.81)
RDW: 14.3 % (ref 11.5–15.5)
WBC: 4.9 10*3/uL (ref 4.0–10.5)

## 2015-06-30 LAB — BASIC METABOLIC PANEL
ANION GAP: 12 (ref 5–15)
BUN: 13 mg/dL (ref 6–20)
CALCIUM: 9.6 mg/dL (ref 8.9–10.3)
CO2: 24 mmol/L (ref 22–32)
Chloride: 104 mmol/L (ref 101–111)
Creatinine, Ser: 1.09 mg/dL (ref 0.61–1.24)
Glucose, Bld: 232 mg/dL — ABNORMAL HIGH (ref 65–99)
Potassium: 4.1 mmol/L (ref 3.5–5.1)
Sodium: 140 mmol/L (ref 135–145)

## 2015-06-30 LAB — PROTIME-INR
INR: 2.79 — AB (ref 0.00–1.49)
PROTHROMBIN TIME: 29 s — AB (ref 11.6–15.2)

## 2015-06-30 LAB — I-STAT TROPONIN, ED: TROPONIN I, POC: 0 ng/mL (ref 0.00–0.08)

## 2015-06-30 NOTE — ED Notes (Signed)
Pt here for increasing weakness over the past week. sts doctor checked hgb and it had dropped from 11 to 9. Denies bleeding but is on coumadin.

## 2015-07-05 NOTE — Progress Notes (Signed)
PCP: [name], MD  Clinic Note: Chief Complaint  Patient presents with  . Follow-up    no chest pain, no shortness of breath, no edema, no pain or cramping in legs, no lightheadedness or dizziness  . Atrial Fibrillation    HPI: [name] is a 75 y.o. male with a PMH below who presents today for 6 month follow-up and ER follow-up for A. fib/flutter (status post a flutter ablation in May 2012). He subsequently had recurrence of A. fib requiring cardioversion in September 2012. He's been maintained on beta blocker, calcium channel blocker and flecainide. He is on chronic Coumadin. When I first met him, he had a very concerning EKG for possible STEMI. He had heart catheterization did not show any significant coronary disease.  [name] was last seen by me in June 2016, and then was seen by Kerin Ransom, PA back in December 2016.  Recent Hospitalizations:   09/24/2014: Admitted with left upper quadrant pain and pneumonia. Treated with bowel regimen. Short course of antibiotics.  ER visit 10/09/2014: Status post fall fatigue. Was having left neck and arm pain. He fell off the ground a few times. Thought to be related to cervical radiculopathy.  ER visit December 19, 2014: Cough and dyspnea with neck pain; discharge with albuterol inhaler.  01/17/2015 ER visit: Neck pain and back pain  Hospital admission 12/14-15/2016: Chest pain rule out MI. Sharp stabbing pain  03/12/2015 ER visit for fall  Feb 2017 - Syncope.  Studies Reviewed:   Event monitor Feb 2017:  Study Highlights     Mostly sinus rhythm to sinus tachycardia with rates from 62-148 BPM.  Mostly 1 AV block  Frequent PVCs, often in bigeminy pattern  No notable evidence of atrial fibrillation. Difficult to assess if rapid rates could be atrial flutter, but less likely.  No significant arrhythmia.   Relatively normal monitor. No evidence of recurrent A. Fib.     Interval History: He presents  today actually doing relatively well. He hasn't had any more significant arrhythmias or syncopal/near-syncopal episodes since his last ER visit. The last time it seemed like he may have been hypotensive, so his Toprol was stopped. He has noted that his heart rate has gone up with irregularity, but is not sure if he's been in A. fib. He deathly notes irregularities heartbeat but is not sure if he is in A. fib or not. He is not having any chest tightness or pressure with rest or exertion. No PND, orthopnea or edema.   No TIA/amaurosis fugax symptoms. No melena, hematochezia, hematuria, or epstaxis. No claudication.  ROS: A comprehensive was performed. Review of Systems  Constitutional: Negative for malaise/fatigue.  HENT: Negative for congestion, hearing loss and nosebleeds.   Eyes: Negative for blurred vision, double vision and photophobia.  Respiratory: Positive for cough (Morning smoker's cough), shortness of breath and wheezing.   Cardiovascular: Negative for leg swelling.  Gastrointestinal: Negative for blood in stool and melena.  Genitourinary: Negative for hematuria.  Musculoskeletal: Positive for joint pain and falls (Has had multiple falls as noted above. Most of them not related to any cardiac etiology.). Negative for myalgias.  Neurological: Positive for dizziness (Sometimes positional) and headaches. Negative for loss of consciousness (No further episodes).  Endo/Heme/Allergies: Does not bruise/bleed easily.  Psychiatric/Behavioral: Negative for depression and memory loss. The patient has insomnia. The patient is not nervous/anxious.   All other systems reviewed and are negative.    Past Medical History  Diagnosis Date  .  Atrial flutter ([place])     a. 07/2010 Status post caval tricuspid isthmus ablation by Dr. Midge Aver Metoprolol daily  . PAF (paroxysmal atrial fibrillation) ([place])     a. Recurrent after atrial flutter, currently controlled on flecainide plus diltiazem  .  Hyperlipidemia     takes Pravastatin daily  . Diastolic CHF, chronic ([place])     a. 12/2012 EF 55-60%, diast dysfxn, triv MR, mildly dil LA/RA.  [name] Coronary artery disease, non-occlusive     a. 03/2010 Nonocclusive disease by cath, performed for ST elevations on ECG;  b. 06/2013 Lexi MV: EF 60%, no ischemia.  . Cervical radiculopathy due to degenerative joint disease of spine   . HTN (hypertension)     takes Diltiazem daily  . Dysrhythmia     HX OF ATRIAL IFB /FLUTTER takes Flecanide and Coumadin daily  . History of bronchitis     1998  . Weakness     numbness and tingling both hands  . Joint pain   . History of gastric ulcer   . Anemia     takes Ferrous Sulfate daily  . History of blood transfusion 1982    "when I had stomach OR"  . Balance problem 01/2014  . Diabetes mellitus type II     takes Metformin and Lantus daily  . Pneumonia 1999  . CAP (community acquired pneumonia) 09/18/2014  . Arthritis     "all over"  . COPD (chronic obstructive pulmonary disease) [facility])     Past Surgical History  Procedure Laterality Date  . Atrial ablation surgery  08/05/10    CTI ablation for atrial flutter by JA  . Cardioversion  12/07/2010     Successful direct current cardioversion with atrial fibrillation to normal sinus rhythm  . Partial gastrectomy  1982    subtotal; "took out 30% for ulcers"  . Nm myoview ltd  07/22/2013    Normal EF ~60%, no ischemia or infarction.  . Colonoscopy [name] 12/02/2013    Procedure: COLONOSCOPY;  Surgeon: Irene Shipper, MD;  Location: [facility];  Service: Endoscopy;  Laterality: [name];  . Cardiac catheterization  2012    nl LV function, no occlusive CAD, PAF  . Knee bursectomy Left 1998  . Incision and drainage abscess / hematoma of bursa / knee / thigh Left 1998    knee  . Cataract extraction w/ intraocular lens  implant, bilateral Bilateral   . Yag laser application Bilateral   . Carpal tunnel release Bilateral 01/30/2014    Procedure: BILATERAL CARPAL  TUNNEL RELEASE;  Surgeon: Marianna Payment, MD;  Location: [facility];  Service: Orthopedics;  Laterality: Bilateral;  . Transthoracic echocardiogram  02/16/2014    EF 60%, no RWMA. - otherwise normal  . Laparoscopic cholecystectomy  03/2010  . Esophagogastroduodenoscopy [name] 09/22/2014    Procedure: ESOPHAGOGASTRODUODENOSCOPY (EGD);  Surgeon: Ronald Lobo, MD;  Location[facility] ENDOSCOPY;  Service: Endoscopy;  Laterality: [name];   Prior to Admission medications   Medication Sig Start Date End Date Taking? Authorizing Provider  acetaminophen (TYLENOL) 500 MG tablet Take 1 tablet (500 mg total) by mouth 2 (two) times daily. 03/12/15  Yes Domenic Moras, PA-C  acetaminophen (TYLENOL) 650 MG CR tablet Take 650 mg by mouth 2 (two) times daily as needed for pain.   Yes Historical Provider, MD  aspirin EC 81 MG tablet Take 81 mg by mouth daily.   Yes Historical Provider, MD  diltiazem (CARDIZEM CD) 120 MG 24 hr capsule Take 1 capsule (120 mg total)  by mouth daily. 03/19/15  Yes Erlene Quan, PA-C  ferrous sulfate 325 (65 FE) MG tablet Take 325 mg by mouth daily with breakfast.   Yes Historical Provider, MD  flecainide (TAMBOCOR) 150 MG tablet TAKE 1 AND 1/2 TABLETS TWICE DAILY 06/28/15  Yes Leonie Man, MD  HYDROcodone-acetaminophen (NORCO/VICODIN) 5-325 MG tablet Take 0.5 tablets by mouth 3 (three) times daily as needed for moderate pain.  02/27/15  Yes Historical Provider, MD  hydrocortisone cream 1 % Apply 1 application topically 2 (two) times daily as needed for itching.  04/27/15  Yes Historical Provider, MD  LANTUS SOLOSTAR 100 UNIT/ML Solostar Pen Inject 2-3 Units as directed 3 (three) times daily.  08/31/14  Yes Historical Provider, MD  loratadine (CLARITIN) 10 MG tablet Take 10 mg by mouth every evening.    Yes Historical Provider, MD  metFORMIN (GLUCOPHAGE) 1000 MG tablet Take 500 mg by mouth 2 (two) times daily with a meal.    Yes Historical Provider, MD  Multiple Vitamin (MULTIVITAMIN WITH MINERALS) TABS  tablet Take 1 tablet by mouth every evening.    Yes Historical Provider, MD  nitroGLYCERIN (NITROSTAT) 0.4 MG SL tablet Place 1 tablet (0.4 mg total) under the tongue every 5 (five) minutes x 3 doses as needed for chest pain. 05/22/14  Yes Leonie Man, MD  pantoprazole (PROTONIX) 40 MG tablet Take 1 tablet (40 mg total) by mouth daily. Patient taking differently: Take 40 mg by mouth every evening.  09/24/14  Yes Thurnell Lose, MD  pravastatin (PRAVACHOL) 40 MG tablet TAKE 1 TABLET EVERY EVENING 10/26/14  Yes Leonie Man, MD  tamsulosin (FLOMAX) 0.4 MG CAPS capsule Take 0.4 mg by mouth every morning. 04/19/15  Yes Historical Provider, MD  warfarin (COUMADIN) 5 MG tablet TAKE 1 TO 1 AND 1/2 TABLETS DAILY AS DIRECTED Patient taking differently: TAKE 1 TO 1 AND 1/2 TABLETS DAILY AS DIRECTED every evening, 5 mg Mon, Wed, and Fri.  7.5 mg all other days 04/06/15  Yes Troy Sine, MD    No Known Allergies   Social History   Social History  . Marital Status: Widowed    Spouse Name: [name]  . Number of Children: [name]  . Years of Education: [name]   Social History Main Topics  . Smoking status: Current Every Day Smoker -- 0.50 packs/day for 55 years    Types: Cigarettes  . Smokeless tobacco: Never Used     Comment: He's been smoking between 0.5 and 2 ppd since age 61.  [name] Alcohol Use: No     Comment: "quit drinking in 1986"  . Drug Use: No  . Sexual Activity: No   Other Topics Concern  . None   Social History Narrative   He is a widower, who recently moved to [place] back in 2011 to live closer to his daughter. He formerly lived in [place]. He is a father 64, grandfather, and great-grandfather of 4. Unfortunately as I mentioned he's gone back to smoking. Smoking about a half pack a day now. He is not very active, but does try to get outside and walk some. He does not drink alcohol.   Family History  Problem Relation Age of Onset  . Cancer Mother   . Heart attack Father   .  Heart attack Brother     Wt Readings from Last 3 Encounters:  07/06/15 188 lb (85.276 kg)  05/22/15 190 lb (86.183 kg)  05/17/15 184 lb 4.9 oz (83.6 kg)  PHYSICAL EXAM BP 122/70 mmHg  Pulse 84  Ht 5' 6"  (1.676 m)  Wt 188 lb (85.276 kg)  BMI 30.36 kg/m2  SpO2 98%  General appearance: alert, cooperative, appears stated age, no distress, moderately obese and Mildly disheveled as usual. Smells like cigarettes, several day growth of beard. However he answers questions properly, and has normal mood and affect. He does have somewhat pressured speech   Neck: no adenopathy, no carotid bruit, no JVD and supple, symmetrical, trachea midline   Lungs: mild rhonchi bilaterally and Mostly in upper lung fields, wheezes Expiratory in the bases and Other interstitial sounds, but no increased worker breathing. He does have increased AP diameter.   Heart: regular rate and rhythm, S1, S2 normal, no murmur, click, rub or gallop and normal apical impulse   Abdomen: soft, non-tender; bowel sounds normal; no masses, no organomegaly and Obese   Extremities: extremities normal, atraumatic, no cyanosis or edema, no edema, redness or tenderness in the calves or thighs and no ulcers, gangrene or trophic changes; Pulses: 2+ and symmetric   Neurologic: Grossly normal    Adult ECG Report n/a   Other studies Reviewed: Additional studies/ records that were reviewed today include:  Recent Labs:  Checked by PCP - not available.    ASSESSMENT / PLAN: Problem List Items Addressed This Visit    Tobacco abuse counseling (Chronic)    He is adamant about the fact that he has cut down significantly now to less than half pack a day, but is really still the contemplative stage of quitting. 3-4 minutes spent stress and treatment options versus simply trying to scale back. Seem to be interested in any treatment options. Keep saying that he will be able to finally quit.      PAF (paroxysmal atrial  fibrillation), maintaining SR (Chronic)    Maintaining sinus rhythm with combination of flecainide plus diltiazem. I'm a bit concerned with him having some tachycardia and bigeminy. Plan is to restart low-dose Toprol 12.5 mg daily. On warfarin for anticoagulation  I don't think he would understand the concept of pill in the pocket flecainide as a breakthrough option.      Relevant Medications   metoprolol succinate (TOPROL-XL) 25 MG 24 hr tablet   Obesity (BMI 30-39.9) (Chronic)    Maintaining stable weight now. Borderline obese.      Near syncope    No further episodes. Probably was vasovagal or hypotensive in nature. He needs to make sure he drinks and eats plenty. I think were okay restarting his beta blocker however.      Relevant Medications   metoprolol succinate (TOPROL-XL) 25 MG 24 hr tablet   Hyperlipidemia (Chronic)    On pravastatin. Monitored by PCP.      Relevant Medications   metoprolol succinate (TOPROL-XL) 25 MG 24 hr tablet   Essential hypertension - Primary (Chronic)    Controlled, if anything may potentially hypotensive. Okay to restart low-dose Toprol.      Relevant Medications   metoprolol succinate (TOPROL-XL) 25 MG 24 hr tablet   Coronary artery disease, non-occlusive (Chronic)    Cardiac cath in 2012 grossly abnormal EKG with A. fib. Minimal disease noted on cath. He is on a beta blocker and calcium channel blocker now. He is also on statin with less followed by PCP. Taking baby aspirin along with warfarin.      Relevant Medications   metoprolol succinate (TOPROL-XL) 25 MG 24 hr tablet   Chronic diastolic congestive heart failure (HCC) (Chronic)  Euvolemic today. No signs of heart failure.      Relevant Medications   metoprolol succinate (TOPROL-XL) 25 MG 24 hr tablet   Anticoagulation goal of INR 2 to 3, for PAF  (Chronic)    - restart 12.5 mg Toprol.    Current medicines are reviewed at length with the patient today. (+/- concerns)  n/a  The following changes have been made:   Start taking METOPROLOL ER 12.5 MG ( 1/2 TABLET OF 25 MG TABLET)  IN THE EVENING  NO OTHER CHANGES  Your physician wants you to follow-up in [place].  9-12 months with Dr. [name]  Studies Ordered:   No orders of the defined types were placed in this encounter.      Leonie Man, M.D., M.S. Interventional Cardiologist   Pager # [phone] Phone # [phone] [address] [place], [place] [address]

## 2015-07-06 ENCOUNTER — Ambulatory Visit (INDEPENDENT_AMBULATORY_CARE_PROVIDER_SITE_OTHER): Payer: Medicare Other | Admitting: Pharmacist Clinician (PhC)/ Clinical Pharmacy Specialist

## 2015-07-06 ENCOUNTER — Ambulatory Visit (INDEPENDENT_AMBULATORY_CARE_PROVIDER_SITE_OTHER): Payer: Medicare Other | Admitting: Cardiology

## 2015-07-06 ENCOUNTER — Encounter: Payer: Self-pay | Admitting: Cardiology

## 2015-07-06 VITALS — BP 122/70 | HR 84 | Ht 66.0 in | Wt 188.0 lb

## 2015-07-06 DIAGNOSIS — Z7901 Long term (current) use of anticoagulants: Secondary | ICD-10-CM | POA: Diagnosis not present

## 2015-07-06 DIAGNOSIS — Z5181 Encounter for therapeutic drug level monitoring: Secondary | ICD-10-CM

## 2015-07-06 DIAGNOSIS — E785 Hyperlipidemia, unspecified: Secondary | ICD-10-CM | POA: Diagnosis not present

## 2015-07-06 DIAGNOSIS — I480 Paroxysmal atrial fibrillation: Secondary | ICD-10-CM | POA: Diagnosis not present

## 2015-07-06 DIAGNOSIS — I10 Essential (primary) hypertension: Secondary | ICD-10-CM | POA: Diagnosis not present

## 2015-07-06 DIAGNOSIS — I2510 Atherosclerotic heart disease of native coronary artery without angina pectoris: Secondary | ICD-10-CM | POA: Diagnosis not present

## 2015-07-06 DIAGNOSIS — R55 Syncope and collapse: Secondary | ICD-10-CM

## 2015-07-06 DIAGNOSIS — I5032 Chronic diastolic (congestive) heart failure: Secondary | ICD-10-CM | POA: Diagnosis not present

## 2015-07-06 DIAGNOSIS — Z716 Tobacco abuse counseling: Secondary | ICD-10-CM

## 2015-07-06 DIAGNOSIS — E669 Obesity, unspecified: Secondary | ICD-10-CM

## 2015-07-06 LAB — POCT INR: INR: 3.6

## 2015-07-06 MED ORDER — METOPROLOL SUCCINATE ER 25 MG PO TB24: 12.5000 mg | ORAL_TABLET | Freq: Every evening | ORAL | Status: DC

## 2015-07-06 NOTE — Patient Instructions (Signed)
Start taking METOPROLOL ER 12.5 MG ( 1/2 TABLET OF 25 MG TABLET)  IN THE EVENING  NO OTHER CHANGES  Your physician wants you to follow-up in [place].   If you need a refill on your cardiac medications before your next appointment, please call your pharmacy.

## 2015-07-07 ENCOUNTER — Encounter: Payer: Self-pay | Admitting: Cardiology

## 2015-07-07 NOTE — Assessment & Plan Note (Signed)
Controlled, if anything may potentially hypotensive. Okay to restart low-dose Toprol.

## 2015-07-07 NOTE — Assessment & Plan Note (Signed)
He is adamant about the fact that he has cut down significantly now to less than half pack a day, but is really still the contemplative stage of quitting. 3-4 minutes spent stress and treatment options versus simply trying to scale back. Seem to be interested in any treatment options. Keep saying that he will be able to finally quit.

## 2015-07-07 NOTE — Assessment & Plan Note (Signed)
No further episodes. Probably was vasovagal or hypotensive in nature. He needs to make sure he drinks and eats plenty. I think were okay restarting his beta blocker however.

## 2015-07-07 NOTE — Assessment & Plan Note (Addendum)
Maintaining sinus rhythm with combination of flecainide plus diltiazem. I'm a bit concerned with him having some tachycardia and bigeminy. Plan is to restart low-dose Toprol 12.5 mg daily. On warfarin for anticoagulation  I don't think he would understand the concept of pill in the pocket flecainide as a breakthrough option.

## 2015-07-07 NOTE — Assessment & Plan Note (Signed)
Maintaining stable weight now. Borderline obese.

## 2015-07-07 NOTE — Assessment & Plan Note (Signed)
Euvolemic today. No signs of heart failure.

## 2015-07-07 NOTE — Assessment & Plan Note (Signed)
Cardiac cath in 2012 grossly abnormal EKG with A. fib. Minimal disease noted on cath. He is on a beta blocker and calcium channel blocker now. He is also on statin with less followed by PCP. Taking baby aspirin along with warfarin.

## 2015-07-07 NOTE — Assessment & Plan Note (Signed)
On pravastatin. Monitored by PCP. 

## 2015-07-30 ENCOUNTER — Ambulatory Visit (INDEPENDENT_AMBULATORY_CARE_PROVIDER_SITE_OTHER): Payer: Medicare Other | Admitting: Pharmacist Clinician (PhC)/ Clinical Pharmacy Specialist

## 2015-07-30 DIAGNOSIS — Z7901 Long term (current) use of anticoagulants: Secondary | ICD-10-CM

## 2015-07-30 DIAGNOSIS — I480 Paroxysmal atrial fibrillation: Secondary | ICD-10-CM

## 2015-07-30 LAB — POCT INR: INR: 2.2

## 2015-08-04 IMAGING — CR DG CHEST 2V
2 series · 2 of 2 positions shown · non-contrast
Comparison: 03/21/2014

CLINICAL DATA: Chills. Shortness of breath. Chest congestion.
Productive cough for 3 days.

EXAM:
CHEST  2 VIEW

[w chest pa]
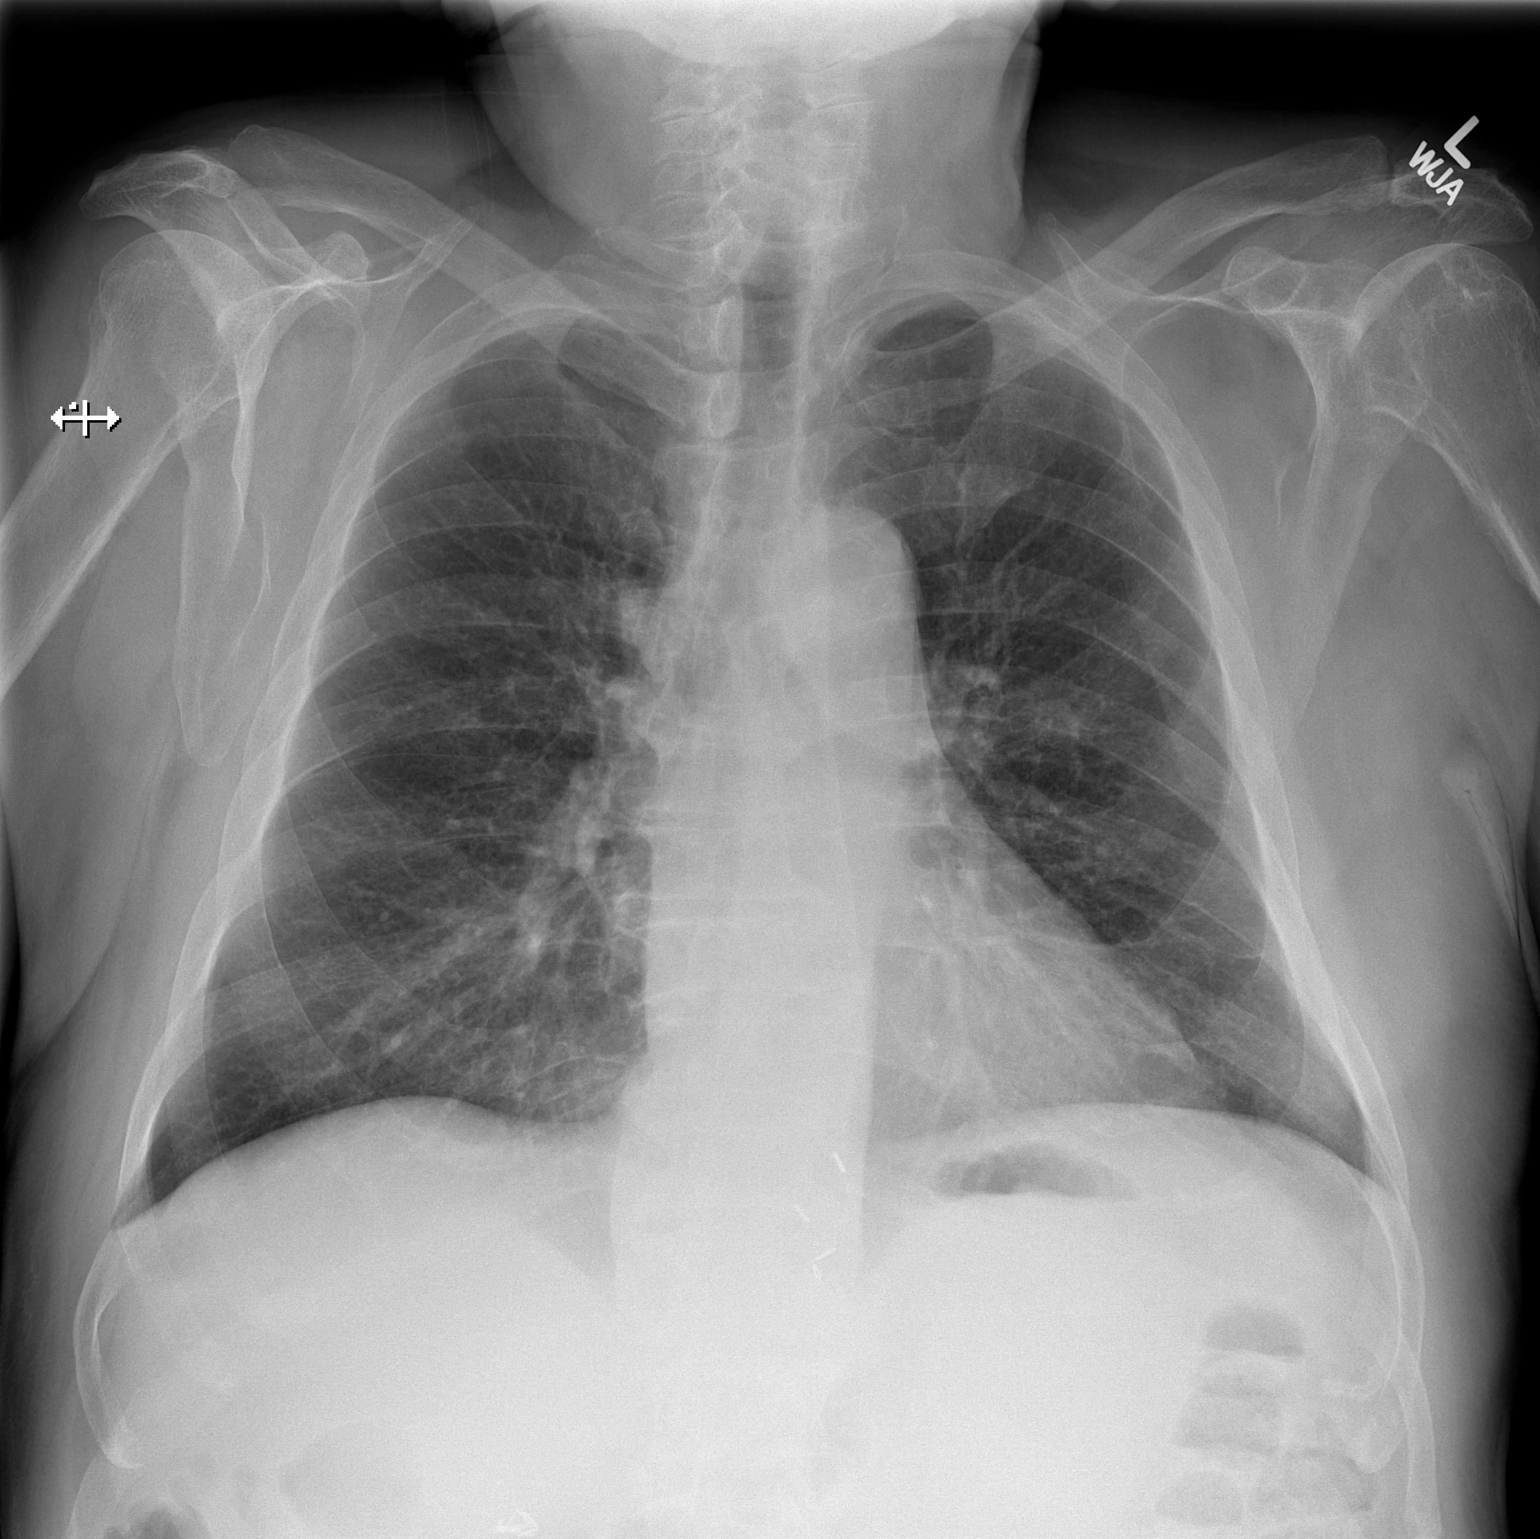

[w chest lat]
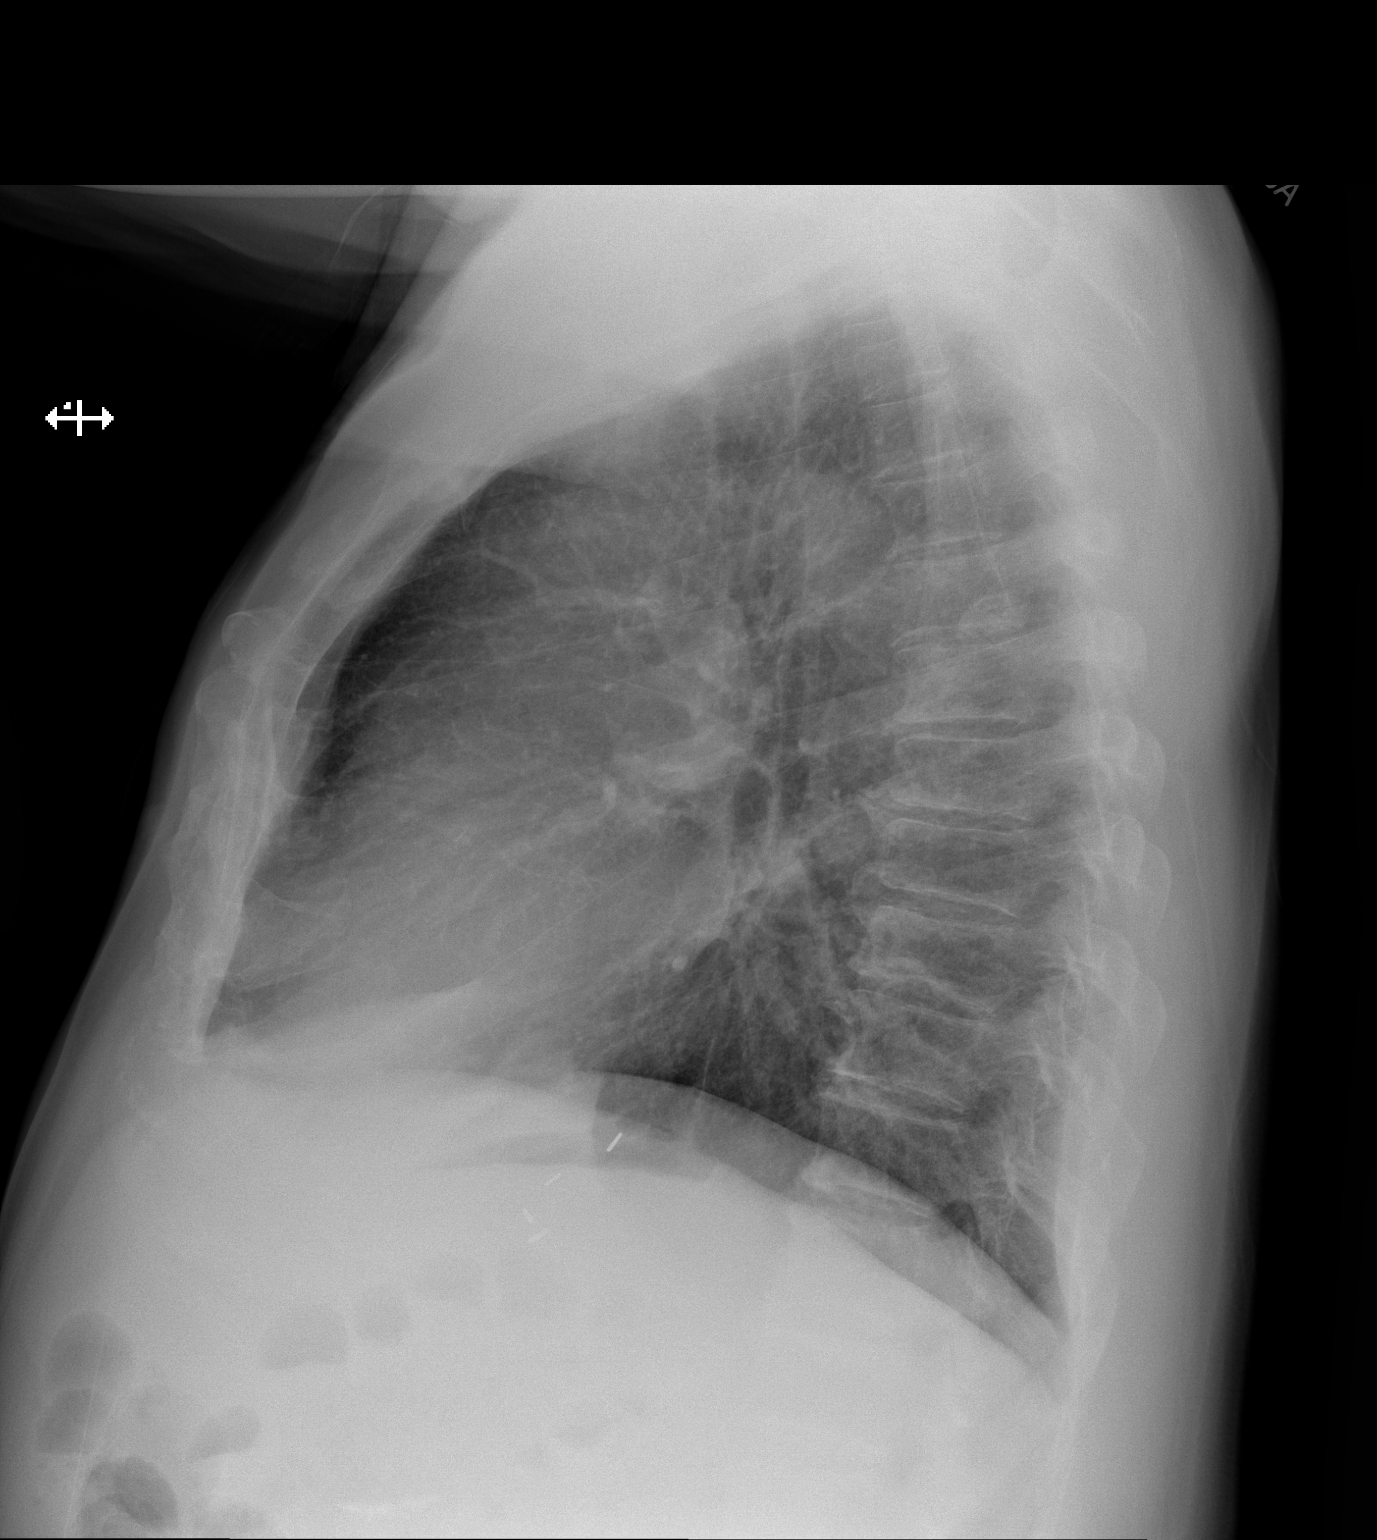

[2 of 2 positions shown; findings below may reference images not displayed]

FINDINGS: Mild thoracic spondylosis. There clips along the gastroesophageal
junction. Density at the T5-6 level on the lateral projection is
shown to be an osteophyte on prior chest CT of 12/19/2011.

The lungs appear clear.  No pleural effusion.  Heart size normal.
IMPRESSION: 1. No active cardiopulmonary disease is radiographically apparent.
2. Thoracic spondylosis

## 2015-08-11 IMAGING — CT CT HEAD W/O CM
1 of 2 series · 15 of 30 positions shown, 19 images · non-contrast
Comparison: CT of the head performed 02/15/2014

CLINICAL DATA: Right-sided posterior headache. Patient on Coumadin.
Right facial pain. Initial encounter.

EXAM:
CT HEAD WITHOUT CONTRAST
TECHNIQUE: Contiguous axial images were obtained from the base of the skull
through the vertex without intravenous contrast.

[Series 3: head 2.0 h70h · axial · 0.44mm/px · z∈[-130,+16]mm · 15 of 83 slices shown, 19 images]
[im 5/83  brain]
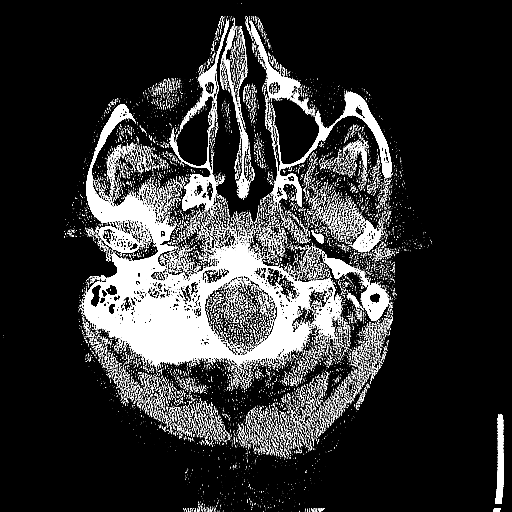
[im 5/83  bone]
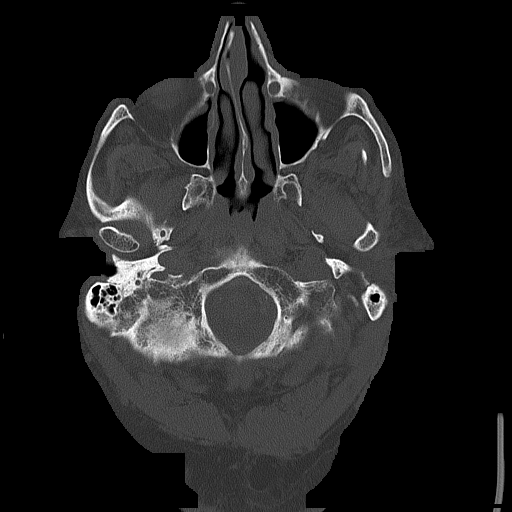
[im 9/83  brain]
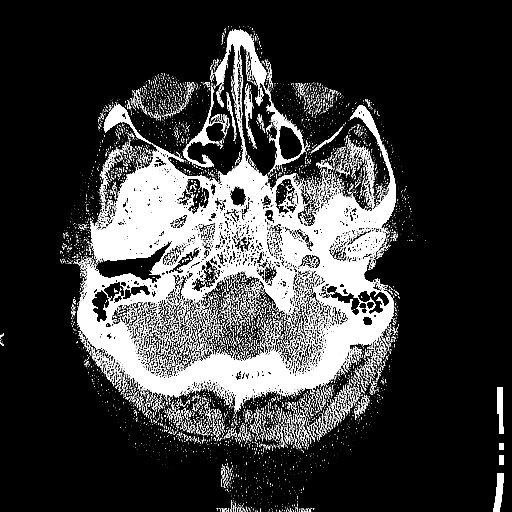
[im 17/83  brain]
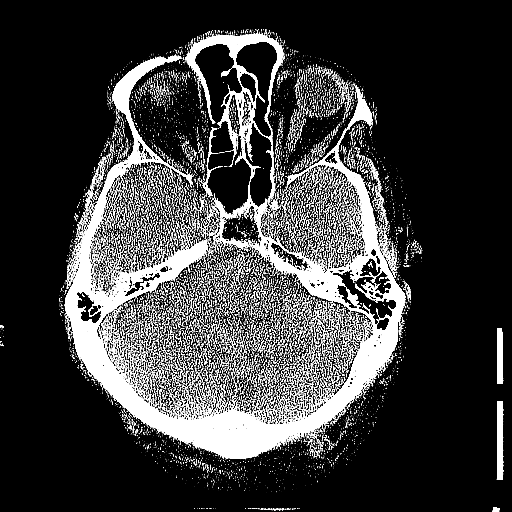
[im 21/83  brain]
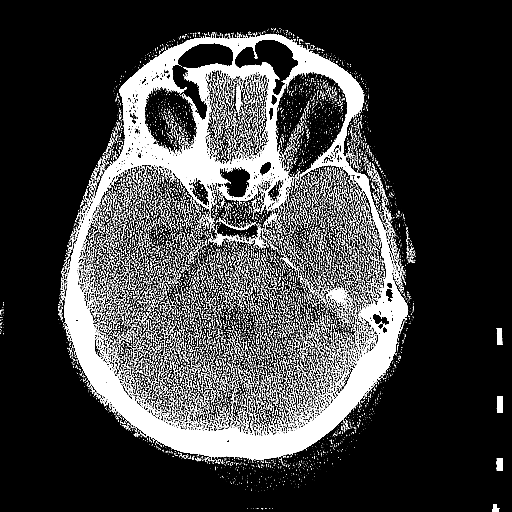
[im 25/83  brain]
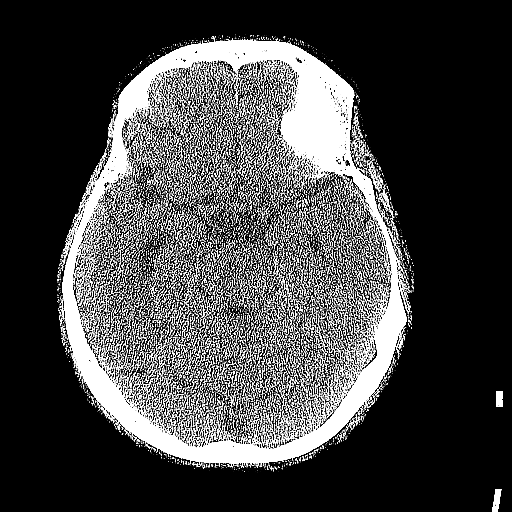
[im 25/83  bone]
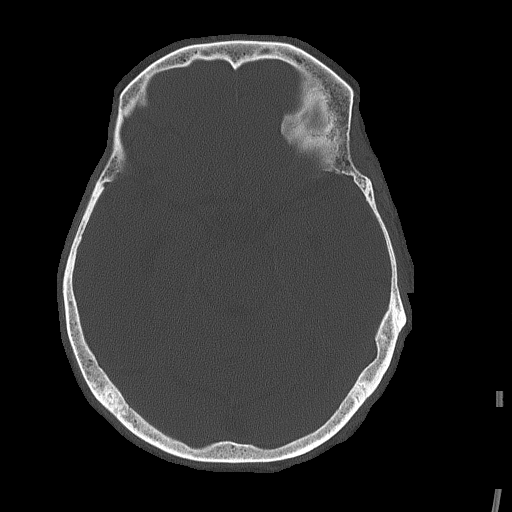
[im 29/83  brain]
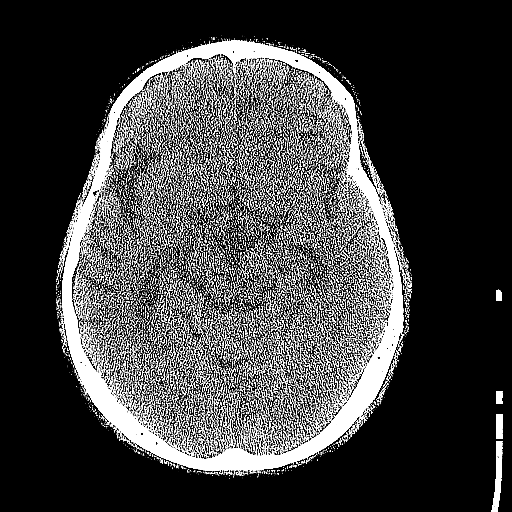
[im 37/83  brain]
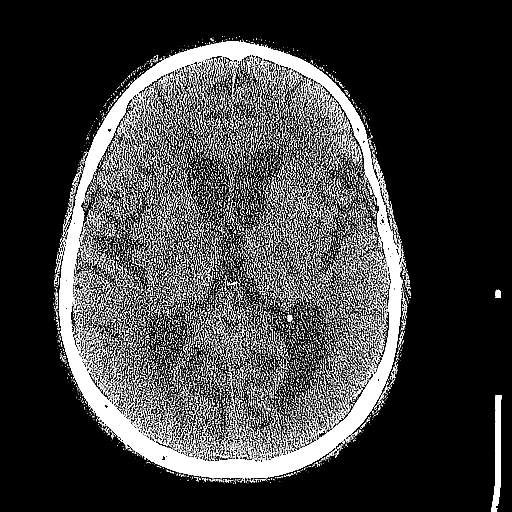
[im 42/83  brain]
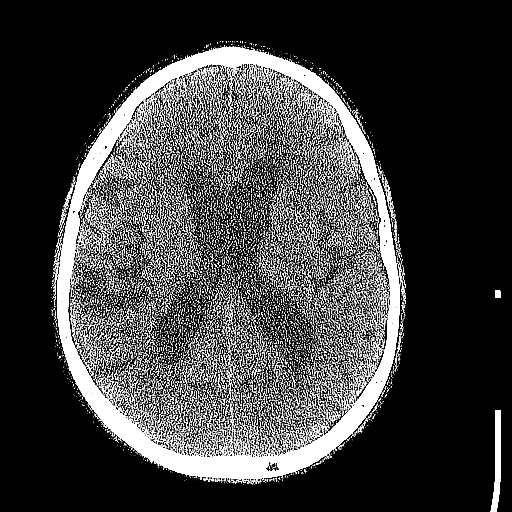
[im 46/83  brain]
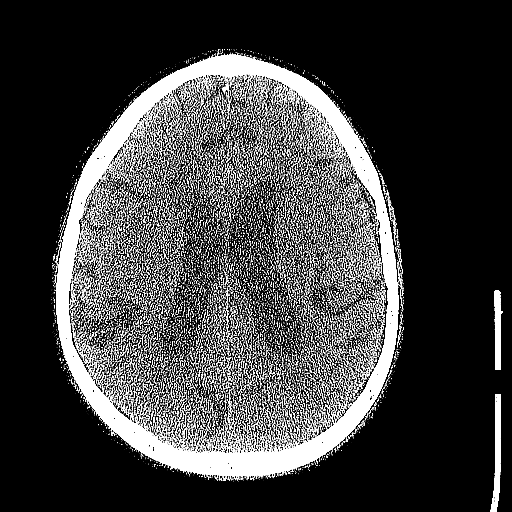
[im 46/83  bone]
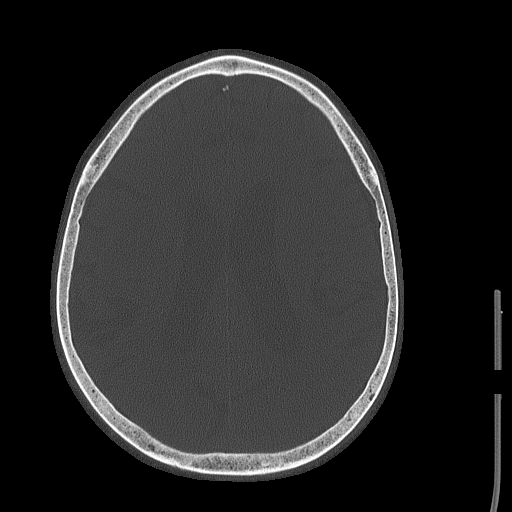
[im 54/83  brain]
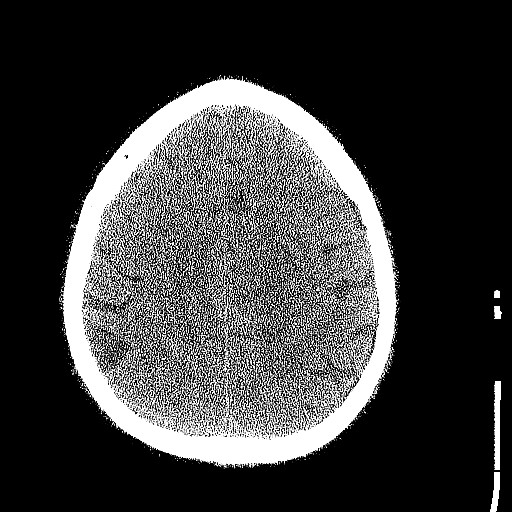
[im 58/83  brain]
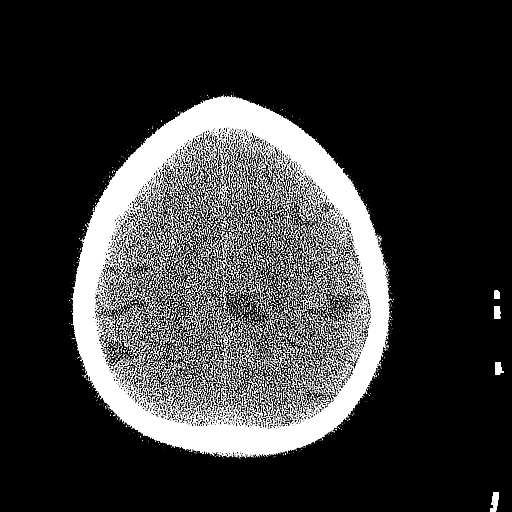
[im 62/83  brain]
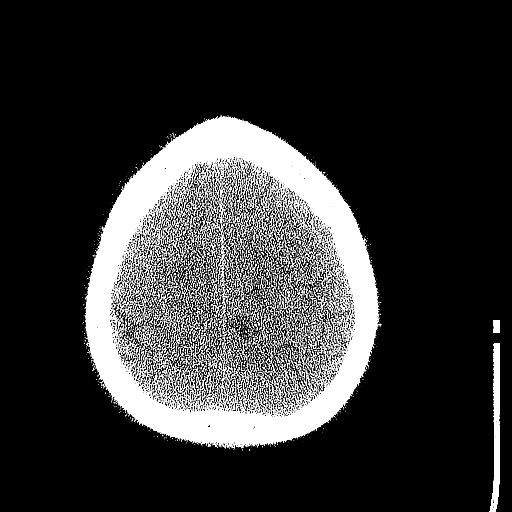
[im 66/83  brain]
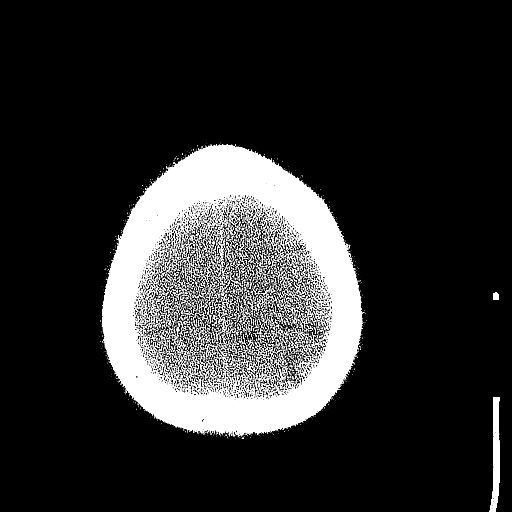
[im 66/83  bone]
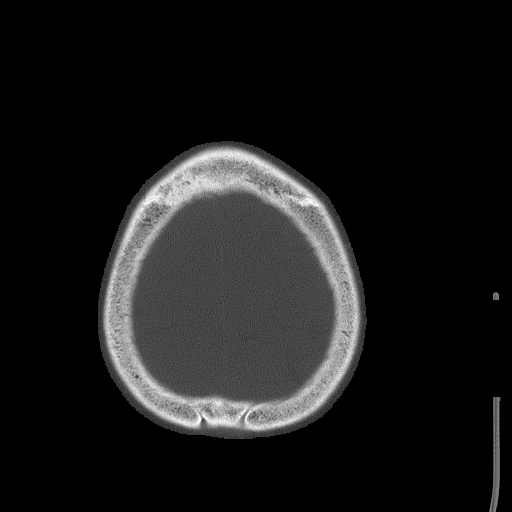
[im 74/83  brain]
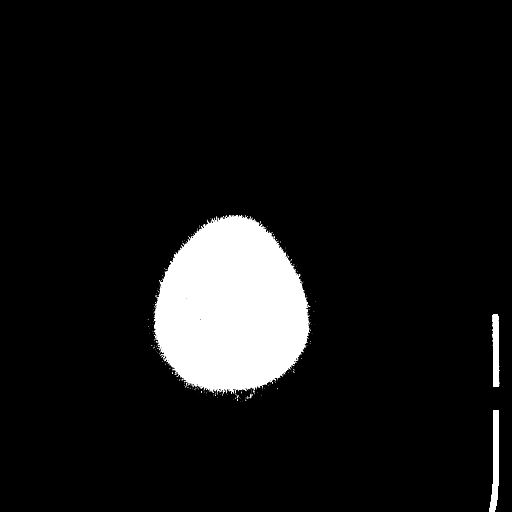
[im 78/83  brain]
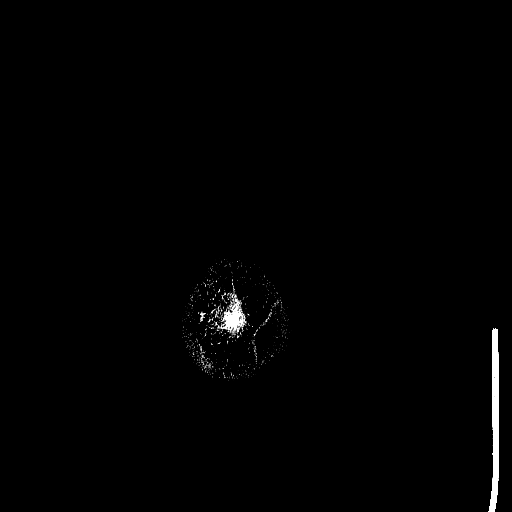

[15 of 30 positions shown; findings below may reference images not displayed]

FINDINGS: There is no evidence of acute infarction, mass lesion, or intra- or
extra-axial hemorrhage on CT.

Prominence of the ventricles and sulci reflects mild cortical volume
loss. Cerebellar atrophy is noted. Scattered periventricular and
subcortical white matter change likely reflects small vessel
ischemic microangiopathy. Minimally increased attenuation at the
occipital lobes is thought to be artifactual in nature.

The brainstem and fourth ventricle are within normal limits. The
basal ganglia are unremarkable in appearance. The cerebral
hemispheres demonstrate grossly normal gray-white differentiation.
No mass effect or midline shift is seen.

There is no evidence of fracture; visualized osseous structures are
unremarkable in appearance. The orbits are within normal limits. The
paranasal sinuses and mastoid air cells are well-aerated. No
significant soft tissue abnormalities are seen.
IMPRESSION: 1. No acute intracranial pathology seen on CT.
2. Mild cortical volume loss and scattered small vessel ischemic
microangiopathy.

## 2015-08-27 ENCOUNTER — Ambulatory Visit (INDEPENDENT_AMBULATORY_CARE_PROVIDER_SITE_OTHER): Payer: Medicare Other | Admitting: Pharmacist

## 2015-08-27 DIAGNOSIS — Z7901 Long term (current) use of anticoagulants: Secondary | ICD-10-CM

## 2015-08-27 DIAGNOSIS — I480 Paroxysmal atrial fibrillation: Secondary | ICD-10-CM | POA: Diagnosis not present

## 2015-08-27 LAB — POCT INR: INR: 3

## 2015-09-01 ENCOUNTER — Emergency Department (HOSPITAL_COMMUNITY)
Admission: EM | Admit: 2015-09-01 | Discharge: 2015-09-01 | Disposition: A | Discharge status: Home / Self Care | Payer: Medicare Other | Attending: Emergency Medicine | Admitting: Emergency Medicine

## 2015-09-01 ENCOUNTER — Encounter (HOSPITAL_COMMUNITY): Payer: Self-pay | Admitting: Emergency Medicine

## 2015-09-01 ENCOUNTER — Emergency Department (HOSPITAL_COMMUNITY): Payer: Medicare Other

## 2015-09-01 DIAGNOSIS — Z7984 Long term (current) use of oral hypoglycemic drugs: Secondary | ICD-10-CM | POA: Insufficient documentation

## 2015-09-01 DIAGNOSIS — R0781 Pleurodynia: Secondary | ICD-10-CM | POA: Diagnosis not present

## 2015-09-01 DIAGNOSIS — I110 Hypertensive heart disease with heart failure: Secondary | ICD-10-CM | POA: Insufficient documentation

## 2015-09-01 DIAGNOSIS — Z794 Long term (current) use of insulin: Secondary | ICD-10-CM | POA: Diagnosis not present

## 2015-09-01 DIAGNOSIS — F17210 Nicotine dependence, cigarettes, uncomplicated: Secondary | ICD-10-CM | POA: Diagnosis not present

## 2015-09-01 DIAGNOSIS — M1990 Unspecified osteoarthritis, unspecified site: Secondary | ICD-10-CM | POA: Diagnosis not present

## 2015-09-01 DIAGNOSIS — I5032 Chronic diastolic (congestive) heart failure: Secondary | ICD-10-CM | POA: Insufficient documentation

## 2015-09-01 DIAGNOSIS — Z7901 Long term (current) use of anticoagulants: Secondary | ICD-10-CM | POA: Insufficient documentation

## 2015-09-01 DIAGNOSIS — I2510 Atherosclerotic heart disease of native coronary artery without angina pectoris: Secondary | ICD-10-CM | POA: Insufficient documentation

## 2015-09-01 DIAGNOSIS — E119 Type 2 diabetes mellitus without complications: Secondary | ICD-10-CM | POA: Insufficient documentation

## 2015-09-01 DIAGNOSIS — J449 Chronic obstructive pulmonary disease, unspecified: Secondary | ICD-10-CM | POA: Diagnosis not present

## 2015-09-01 DIAGNOSIS — R1032 Left lower quadrant pain: Secondary | ICD-10-CM | POA: Insufficient documentation

## 2015-09-01 DIAGNOSIS — E785 Hyperlipidemia, unspecified: Secondary | ICD-10-CM | POA: Insufficient documentation

## 2015-09-01 DIAGNOSIS — R109 Unspecified abdominal pain: Secondary | ICD-10-CM

## 2015-09-01 DIAGNOSIS — Z7982 Long term (current) use of aspirin: Secondary | ICD-10-CM | POA: Insufficient documentation

## 2015-09-01 LAB — CBC WITH DIFFERENTIAL/PLATELET
BASOS ABS: 0.1 10*3/uL (ref 0.0–0.1)
Basophils Relative: 1 %
EOS ABS: 0.3 10*3/uL (ref 0.0–0.7)
EOS PCT: 4 %
HCT: 36.3 % — ABNORMAL LOW (ref 39.0–52.0)
Hemoglobin: 11.2 g/dL — ABNORMAL LOW (ref 13.0–17.0)
LYMPHS ABS: 1.7 10*3/uL (ref 0.7–4.0)
LYMPHS PCT: 28 %
MCH: 28.6 pg (ref 26.0–34.0)
MCHC: 30.9 g/dL (ref 30.0–36.0)
MCV: 92.8 fL (ref 78.0–100.0)
MONO ABS: 0.5 10*3/uL (ref 0.1–1.0)
MONOS PCT: 8 %
Neutro Abs: 3.6 10*3/uL (ref 1.7–7.7)
Neutrophils Relative %: 59 %
PLATELETS: 280 10*3/uL (ref 150–400)
RBC: 3.91 MIL/uL — AB (ref 4.22–5.81)
RDW: 15.9 % — AB (ref 11.5–15.5)
WBC: 6.1 10*3/uL (ref 4.0–10.5)

## 2015-09-01 LAB — URINALYSIS, ROUTINE W REFLEX MICROSCOPIC
Bilirubin Urine: NEGATIVE
GLUCOSE, UA: NEGATIVE mg/dL
KETONES UR: NEGATIVE mg/dL
Nitrite: NEGATIVE
PROTEIN: NEGATIVE mg/dL
Specific Gravity, Urine: 1.012 (ref 1.005–1.030)
pH: 6.5 (ref 5.0–8.0)

## 2015-09-01 LAB — BASIC METABOLIC PANEL
Anion gap: 7 (ref 5–15)
BUN: 15 mg/dL (ref 6–20)
CHLORIDE: 104 mmol/L (ref 101–111)
CO2: 25 mmol/L (ref 22–32)
CREATININE: 0.92 mg/dL (ref 0.61–1.24)
Calcium: 9.6 mg/dL (ref 8.9–10.3)
GFR calc Af Amer: >60 mL/min (ref >60)
GFR calc non Af Amer: >60 mL/min (ref >60)
GLUCOSE: 146 mg/dL — AB (ref 65–99)
POTASSIUM: 4.2 mmol/L (ref 3.5–5.1)
Sodium: 136 mmol/L (ref 135–145)

## 2015-09-01 LAB — URINE MICROSCOPIC-ADD ON

## 2015-09-01 MED ORDER — HYDROCODONE-ACETAMINOPHEN 5-325 MG PO TABS
1.0000 | ORAL_TABLET | Freq: Once | ORAL | Status: AC
  Administered 2015-09-01: 1 via ORAL
  Filled 2015-09-01: qty 1

## 2015-09-01 MED ORDER — HYDROCODONE-ACETAMINOPHEN 5-325 MG PO TABS: 0.5000 | ORAL_TABLET | Freq: Four times a day (QID) | ORAL | Status: DC | PRN

## 2015-09-01 NOTE — ED Provider Notes (Signed)
Patient seen and evaluated. Discussed with PA. Has tenderness of the left lower ribs and upper abdomen. Reassuring studies. Reproducible on exam.  Agree with symptomatic treatment.  Tanna Furry, MD 09/01/15 2231

## 2015-09-01 NOTE — ED Notes (Signed)
Per EMS pt reports pain in left lower rib area since 08/28/15. Denies any injury to ribs or pain with deep breath or movement. Pt denies any urinary symptoms or chest pain. Pt ambulated without difficulty to restroom

## 2015-09-01 NOTE — ED Provider Notes (Signed)
CSN: [id number]     Arrival date & time 09/01/15  2017 History   First MD Initiated Contact with Patient 09/01/15 2029     Chief Complaint  Patient presents with  . Muscle Pain    (Consider location/radiation/quality/duration/timing/severity/associated sxs/prior Treatment) Patient is a 75 y.o. male presenting with musculoskeletal pain. The history is provided by the patient and medical records. No language interpreter was used.  Muscle Pain Associated symptoms include nausea. Pertinent negatives include no abdominal pain, chills, congestion, coughing, fever, headaches, neck pain, rash, vomiting or weakness.  Gerrad Post is a 75 y.o. male  with a PMH of controlled afib, CHF, CAD,DM, COPD who presents to the Emergency Department complaining of intermittent non-radiating sharp, aching left lower rib / flank pain that has occurred throughout the day for the last 5 days. Taking half tablet of home Norco which provides temporary relief. Denies aggravating factors including worsening with movement. Denies injury or inciting event. Admits to nausea. Denies chest pain, sob, abdominal pain, vomiting, dysuria, fever. Normal BM's twice daily.   Past Medical History  Diagnosis Date  . Atrial flutter ([place])     a. 07/2010 Status post caval tricuspid isthmus ablation by Dr. Midge Aver Metoprolol daily  . PAF (paroxysmal atrial fibrillation) ([place])     a. Recurrent after atrial flutter, currently controlled on flecainide plus diltiazem  . Hyperlipidemia     takes Pravastatin daily  . Diastolic CHF, chronic ([place])     a. 12/2012 EF 55-60%, diast dysfxn, triv MR, mildly dil LA/RA.  [name] Coronary artery disease, non-occlusive     a. 03/2010 Nonocclusive disease by cath, performed for ST elevations on ECG;  b. 06/2013 Lexi MV: EF 60%, no ischemia.  . Cervical radiculopathy due to degenerative joint disease of spine   . HTN (hypertension)     takes Diltiazem daily  . Dysrhythmia     HX OF ATRIAL IFB /FLUTTER  takes Flecanide and Coumadin daily  . History of bronchitis     1998  . Weakness     numbness and tingling both hands  . Joint pain   . History of gastric ulcer   . Anemia     takes Ferrous Sulfate daily  . History of blood transfusion 1982    "when I had stomach OR"  . Balance problem 01/2014  . Diabetes mellitus type II     takes Metformin and Lantus daily  . Pneumonia 1999  . CAP (community acquired pneumonia) 09/18/2014  . Arthritis     "all over"  . COPD (chronic obstructive pulmonary disease) [facility])    Past Surgical History  Procedure Laterality Date  . Atrial ablation surgery  08/05/10    CTI ablation for atrial flutter by JA  . Cardioversion  12/07/2010     Successful direct current cardioversion with atrial fibrillation to normal sinus rhythm  . Partial gastrectomy  1982    subtotal; "took out 30% for ulcers"  . Nm myoview ltd  07/22/2013    Normal EF ~60%, no ischemia or infarction.  . Colonoscopy [name] 12/02/2013    Procedure: COLONOSCOPY;  Surgeon: Irene Shipper, MD;  Location: [place];  Service: Endoscopy;  Laterality: [name];  . Cardiac catheterization  2012    nl LV function, no occlusive CAD, PAF  . Knee bursectomy Left 1998  . Incision and drainage abscess / hematoma of bursa / knee / thigh Left 1998    knee  . Cataract extraction w/ intraocular lens  implant, bilateral Bilateral   .  Yag laser application Bilateral   . Carpal tunnel release Bilateral 01/30/2014    Procedure: BILATERAL CARPAL TUNNEL RELEASE;  Surgeon: Marianna Payment, MD;  Location: [place];  Service: Orthopedics;  Laterality: Bilateral;  . Transthoracic echocardiogram  02/16/2014    EF 60%, no RWMA. - otherwise normal  . Laparoscopic cholecystectomy  03/2010  . Esophagogastroduodenoscopy [name] 09/22/2014    Procedure: ESOPHAGOGASTRODUODENOSCOPY (EGD);  Surgeon: Ronald Lobo, MD;  Location: [facility] ENDOSCOPY;  Service: Endoscopy;  Laterality: [name];   Family History  Problem Relation Age of Onset  .  Cancer Mother   . Heart attack Father   . Heart attack Brother    Social History  Substance Use Topics  . Smoking status: Current Every Day Smoker -- 0.50 packs/day for 55 years    Types: Cigarettes  . Smokeless tobacco: Never Used     Comment: He's been smoking between 0.5 and 2 ppd since age 77.  [name] Alcohol Use: No     Comment: "quit drinking in 1986"    Review of Systems  Constitutional: Negative for fever and chills.  HENT: Negative for congestion.   Eyes: Negative for visual disturbance.  Respiratory: Negative for cough and shortness of breath.   Cardiovascular: Negative.   Gastrointestinal: Positive for nausea. Negative for vomiting and abdominal pain.  Genitourinary: Positive for flank pain. Negative for dysuria.  Musculoskeletal: Negative for back pain and neck pain.  Skin: Negative for rash.  Neurological: Negative for dizziness, weakness and headaches.      Allergies  Review of patient's allergies indicates no known allergies.  Home Medications   Prior to Admission medications   Medication Sig Start Date End Date Taking? Authorizing Provider  aspirin EC 81 MG tablet Take 81 mg by mouth daily.   Yes Historical Provider, MD  diltiazem (CARDIZEM CD) 120 MG 24 hr capsule Take 1 capsule (120 mg total) by mouth daily. 03/19/15  Yes Erlene Quan, PA-C  ferrous sulfate 325 (65 FE) MG tablet Take 325 mg by mouth daily with breakfast.   Yes Historical Provider, MD  flecainide (TAMBOCOR) 150 MG tablet TAKE 1 AND 1/2 TABLETS TWICE DAILY 06/28/15  Yes Leonie Man, MD  hydrocortisone cream 1 % Apply 1 application topically 2 (two) times daily as needed for itching.  04/27/15  Yes Historical Provider, MD  Insulin Glargine (LANTUS) 100 UNIT/ML Solostar Pen Inject 3 Units into the skin 4 (four) times daily.   Yes Historical Provider, MD  loratadine (CLARITIN) 10 MG tablet Take 10 mg by mouth every evening.    Yes Historical Provider, MD  metFORMIN (GLUCOPHAGE) 1000 MG tablet Take  500 mg by mouth 2 (two) times daily with a meal.    Yes Historical Provider, MD  metoprolol succinate (TOPROL-XL) 25 MG 24 hr tablet Take 0.5 tablets (12.5 mg total) by mouth every evening. 07/06/15  Yes Leonie Man, MD  Multiple Vitamin (MULTIVITAMIN WITH MINERALS) TABS tablet Take 1 tablet by mouth every evening.    Yes Historical Provider, MD  pantoprazole (PROTONIX) 40 MG tablet Take 1 tablet (40 mg total) by mouth daily. Patient taking differently: Take 40 mg by mouth every evening.  09/24/14  Yes Thurnell Lose, MD  pravastatin (PRAVACHOL) 40 MG tablet TAKE 1 TABLET EVERY EVENING 10/26/14  Yes Leonie Man, MD  tamsulosin (FLOMAX) 0.4 MG CAPS capsule Take 0.4 mg by mouth every morning. 04/19/15  Yes Historical Provider, MD  warfarin (COUMADIN) 5 MG tablet TAKE 1 TO 1 AND  1/2 TABLETS DAILY AS DIRECTED Patient taking differently: TAKE 1 TO 1 AND 1/2 TABLETS DAILY AS DIRECTED every evening, 5 mg Mon, Wed, and Fri.  7.5 mg all other days 04/06/15  Yes Troy Sine, MD  acetaminophen (TYLENOL) 500 MG tablet Take 1 tablet (500 mg total) by mouth 2 (two) times daily. 03/12/15   Domenic Moras, PA-C  acetaminophen (TYLENOL) 650 MG CR tablet Take 650 mg by mouth 2 (two) times daily as needed for pain.    Historical Provider, MD  HYDROcodone-acetaminophen (NORCO/VICODIN) 5-325 MG tablet Take 0.5 tablets by mouth every 6 (six) hours as needed for severe pain. 09/01/15   Ozella Almond Hitesh Fouche, PA-C  nitroGLYCERIN (NITROSTAT) 0.4 MG SL tablet Place 1 tablet (0.4 mg total) under the tongue every 5 (five) minutes x 3 doses as needed for chest pain. 05/22/14   Leonie Man, MD   BP 159/85 mmHg  Pulse 82  Temp(Src) 97.8 F (36.6 C)  Resp 18  SpO2 96% Physical Exam  Constitutional: He is oriented to person, place, and time. He appears well-developed and well-nourished.  Alert and in no acute distress  HENT:  Head: Normocephalic and atraumatic.  Cardiovascular: Normal rate, regular rhythm and normal heart  sounds.   Pulmonary/Chest: Effort normal and breath sounds normal. No respiratory distress. He has no wheezes. He has no rales. He exhibits no tenderness.  Abdominal: Soft. Bowel sounds are normal. He exhibits no distension and no mass. There is no rebound and no guarding.    No CVA tenderness.   Musculoskeletal: He exhibits no edema.  Neurological: He is alert and oriented to person, place, and time.  Skin: Skin is warm and dry.  No erythema, ecchymosis, swelling, or deformity appreciated over area of tenderness.   Nursing note and vitals reviewed.   ED Course  Procedures (including critical care time) Labs Review Labs Reviewed  BASIC METABOLIC PANEL - Abnormal; Notable for the following:    Glucose, Bld 146 (*)    All other components within normal limits  CBC WITH DIFFERENTIAL/PLATELET - Abnormal; Notable for the following:    RBC 3.91 (*)    Hemoglobin 11.2 (*)    HCT 36.3 (*)    RDW 15.9 (*)    All other components within normal limits  URINALYSIS, ROUTINE W REFLEX MICROSCOPIC (NOT AT [facility]) - Abnormal; Notable for the following:    APPearance CLOUDY (*)    Hgb urine dipstick TRACE (*)    Leukocytes, UA LARGE (*)    All other components within normal limits  URINE MICROSCOPIC-ADD ON - Abnormal; Notable for the following:    Squamous Epithelial / LPF 0-5 (*)    Bacteria, UA FEW (*)    All other components within normal limits  URINE CULTURE    Imaging Review Ct Renal Stone Study  09/01/2015  CLINICAL DATA:  75 year old male with left flank pain. EXAM: CT ABDOMEN AND PELVIS WITHOUT CONTRAST TECHNIQUE: Multidetector CT imaging of the abdomen and pelvis was performed following the standard protocol without IV contrast. COMPARISON:  Abdominal CT dated 09/22/2014 FINDINGS: Evaluation of this exam is limited in the absence of intravenous contrast. The visualized lung bases are clear. There is coronary vascular calcification. No intra-abdominal free air or free fluid.  Cholecystectomy. The liver, pancreas, spleen, adrenal glands appear unremarkable. Vascular calcification versus small nonobstructing right renal inferior pole calculi measuring up to 6 mm. No hydronephrosis. The left kidney is unremarkable. The visualized ureters and urinary bladder appear unremarkable. The prostate  and seminal vesicles are grossly unremarkable. Surgical clips noted at the gastroesophageal junction. There is postsurgical changes of the stomach likely related to prior antrectomy and gastrojejunostomy. There is no evidence of bowel obstruction or active inflammation. Moderate stool noted throughout the colon. There scattered colonic diverticula without active inflammatory changes. The appendix is unremarkable. There is mild moderate aortoiliac atherosclerotic disease. There is a 2.5 cm infrarenal abdominal aortic ectasia. Evaluation of the vasculature is limited on this noncontrast study. No portal venous gas identified. There is no adenopathy. There is a midline vertical anterior pelvic wall incisional scar. The abdominal wall soft tissues are otherwise unremarkable. There is degenerative changes of the spine with anterior osteophyte. No acute fracture. IMPRESSION: Vascular calcification versus nonobstructing right renal inferior pole calculi. No hydronephrosis. No evidence of bowel obstruction or inflammation.  Normal appendix. Colonic diverticulosis. Electronically Signed   By: Anner Crete M.D.   On: 09/01/2015 21:55   I have personally reviewed and evaluated these images and lab results as part of my medical decision-making.   EKG Interpretation None      MDM   Final diagnoses:  Left flank pain   Tahir Gollehon presents with 5 day history of left lower rib / flank pain. + nausea - no other associated symptoms. On exam, no overlying skin changes. Mild TTP. Will obtain labs, UA, and CT renal.   Labs: CBC with stable anemia, BMP reassuring, UA with large leuks and 6-30 WBC's --  patient denies any urinary symptoms and exam findings not c/w UTI - will send urine for cx.  Imaging: CT reassuring.   Therapeutics: Pain control while in ED  Will dc with short course pain meds. Encouraged PCP follow up. Return precautions given and all questions answered.   Patient seen by and discussed with Dr. Jeneen Rinks who agrees with treatment plan.   [facility] Glendoris Nodarse, PA-C 09/02/15 0031  Tanna Furry, MD 09/16/15 [phone]

## 2015-09-01 NOTE — Discharge Instructions (Signed)
It was my pleasure taking care of you today!   Take pain medication only as needed for severe pain - This can make you very drowsy - please do not drink alcohol, operate heavy machinery or drive on this medication. Also be careful when walking because this medication can affect your balance and lead to a fall. Follow up with your primary care provider if symptoms do not improve in 3 days. Return to ER for new or worsening symptoms, any additional concerns.

## 2015-09-01 NOTE — ED Notes (Signed)
Bed: [facility] Expected date:  Expected time:  Means of arrival:  Comments: 75 yo M  Flank pain

## 2015-09-03 LAB — URINE CULTURE

## 2015-09-06 ENCOUNTER — Encounter (HOSPITAL_COMMUNITY): Payer: Self-pay | Admitting: Vascular Surgery

## 2015-09-06 ENCOUNTER — Emergency Department (HOSPITAL_COMMUNITY)
Admission: EM | Admit: 2015-09-06 | Discharge: 2015-09-06 | Disposition: A | Discharge status: Home / Self Care | Payer: Medicare Other | Attending: Dermatology | Admitting: Dermatology

## 2015-09-06 DIAGNOSIS — Z5321 Procedure and treatment not carried out due to patient leaving prior to being seen by health care provider: Secondary | ICD-10-CM | POA: Insufficient documentation

## 2015-09-06 DIAGNOSIS — R109 Unspecified abdominal pain: Secondary | ICD-10-CM | POA: Diagnosis not present

## 2015-09-06 DIAGNOSIS — M545 Low back pain: Secondary | ICD-10-CM | POA: Insufficient documentation

## 2015-09-06 DIAGNOSIS — Z7982 Long term (current) use of aspirin: Secondary | ICD-10-CM | POA: Insufficient documentation

## 2015-09-06 LAB — COMPREHENSIVE METABOLIC PANEL
ALK PHOS: 78 U/L (ref 38–126)
ALT: 32 U/L (ref 17–63)
ANION GAP: 7 (ref 5–15)
AST: 27 U/L (ref 15–41)
Albumin: 3.4 g/dL — ABNORMAL LOW (ref 3.5–5.0)
BILIRUBIN TOTAL: 0.4 mg/dL (ref 0.3–1.2)
BUN: 14 mg/dL (ref 6–20)
CALCIUM: 9.4 mg/dL (ref 8.9–10.3)
CO2: 28 mmol/L (ref 22–32)
Chloride: 104 mmol/L (ref 101–111)
Creatinine, Ser: 1.16 mg/dL (ref 0.61–1.24)
GFR, EST NON AFRICAN AMERICAN: 59 mL/min — AB (ref >60)
Glucose, Bld: 134 mg/dL — ABNORMAL HIGH (ref 65–99)
Potassium: 4.4 mmol/L (ref 3.5–5.1)
Sodium: 139 mmol/L (ref 135–145)
TOTAL PROTEIN: 6.2 g/dL — AB (ref 6.5–8.1)

## 2015-09-06 LAB — CBC
HCT: 35.6 % — ABNORMAL LOW (ref 39.0–52.0)
HEMOGLOBIN: 10.6 g/dL — AB (ref 13.0–17.0)
MCH: 27.8 pg (ref 26.0–34.0)
MCHC: 29.8 g/dL — AB (ref 30.0–36.0)
MCV: 93.4 fL (ref 78.0–100.0)
Platelets: 234 10*3/uL (ref 150–400)
RBC: 3.81 MIL/uL — AB (ref 4.22–5.81)
RDW: 15 % (ref 11.5–15.5)
WBC: 7.4 10*3/uL (ref 4.0–10.5)

## 2015-09-06 LAB — LIPASE, BLOOD: Lipase: 22 U/L (ref 11–51)

## 2015-09-06 NOTE — ED Notes (Signed)
Pt reports to the ED for eval of abd pain and bilateral flank pain intermittently for several months. Pt has been seen by his PCP and here for the same and has had negative workup. He also has pain in his low back as well as some nausea. Denies any V/D. Pt reports decreased urinary frequency but thinks it may be because he has not been drinking much. Pt A&Ox4, resp e/u, and skin warm and dry.

## 2015-09-16 IMAGING — DX DG CHEST 2V
2 series · 2 of 2 positions shown · non-contrast
Comparison: 05/24/2014

CLINICAL DATA: Cough, wheezing for the past 4 months.

EXAM:
CHEST  2 VIEW

[chest pa]
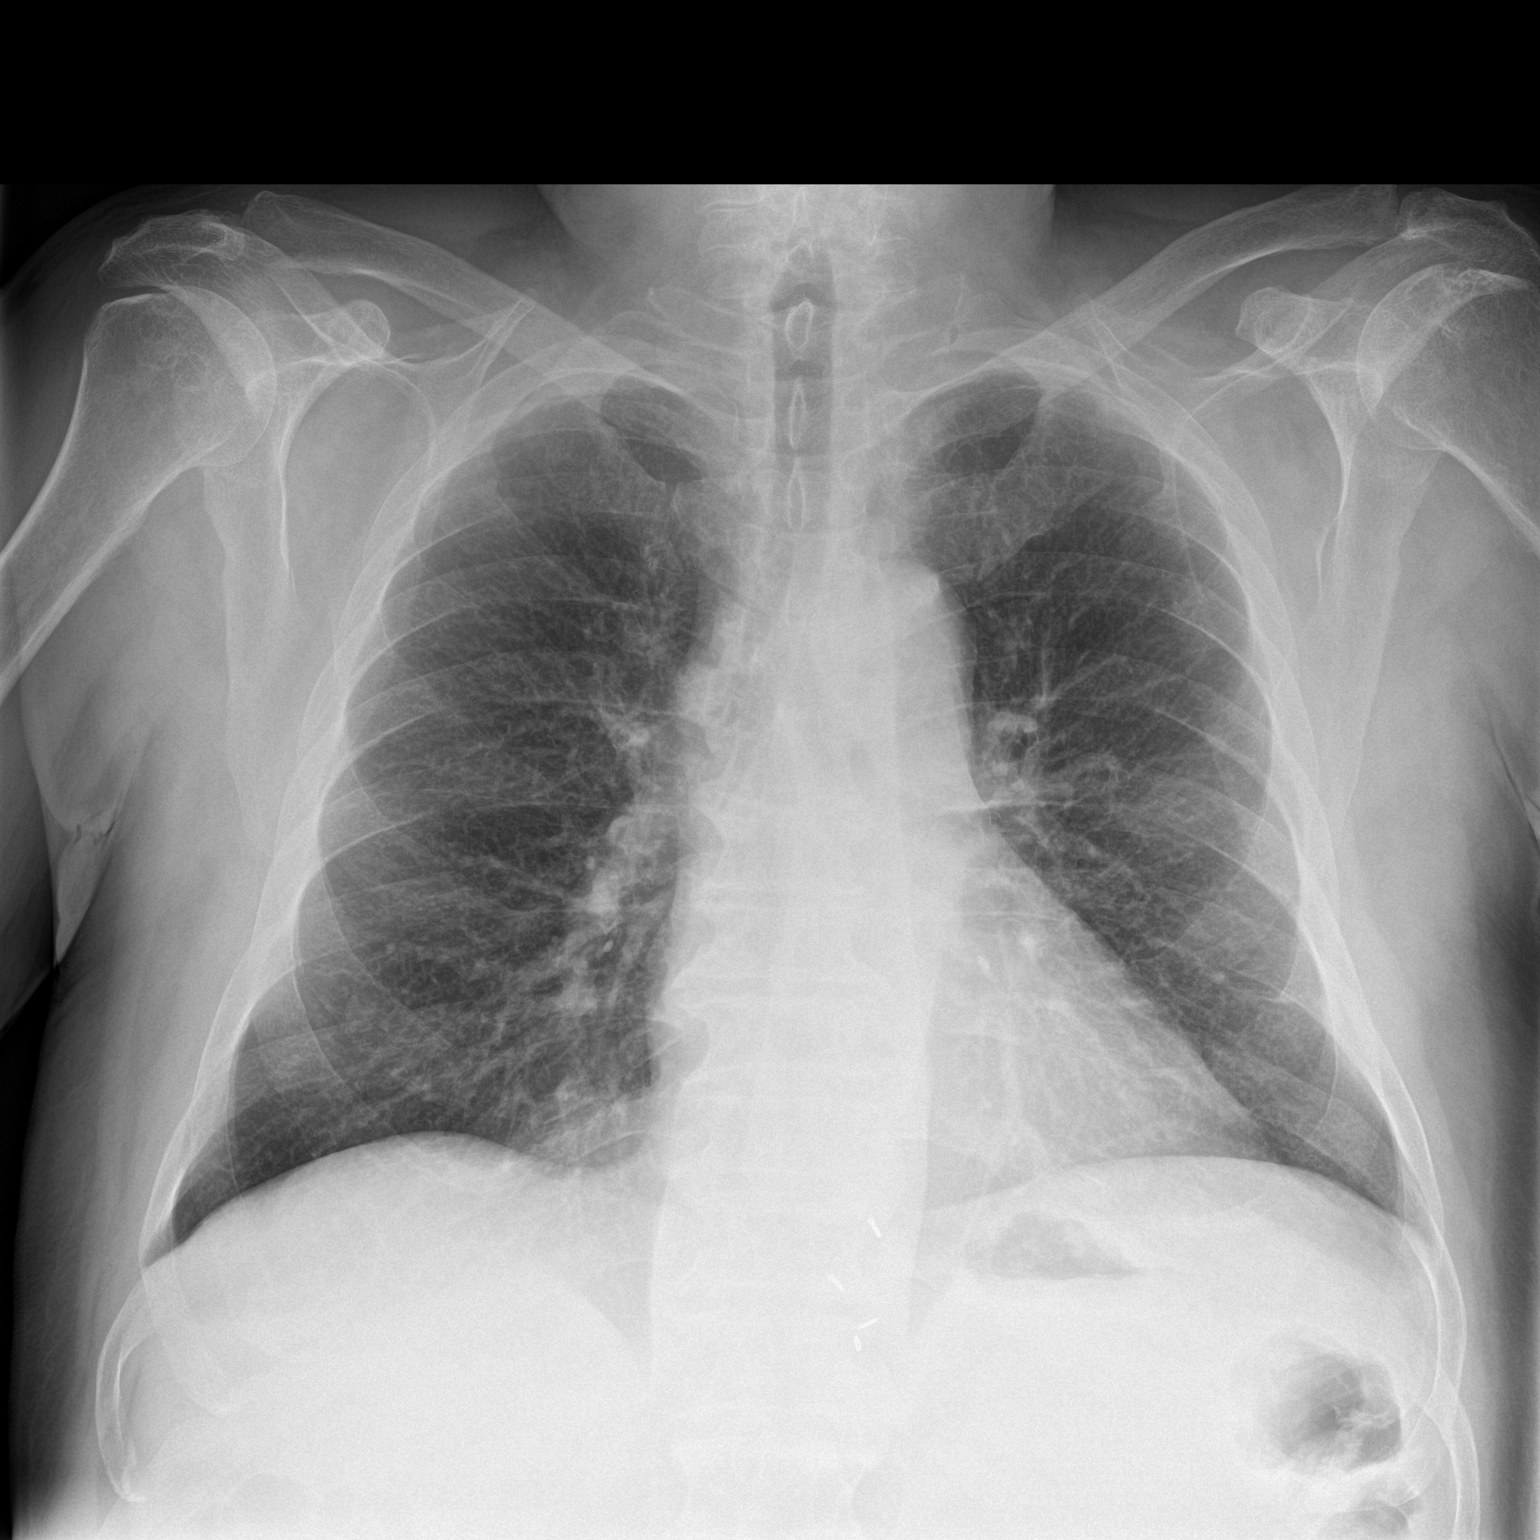

[chest lat]
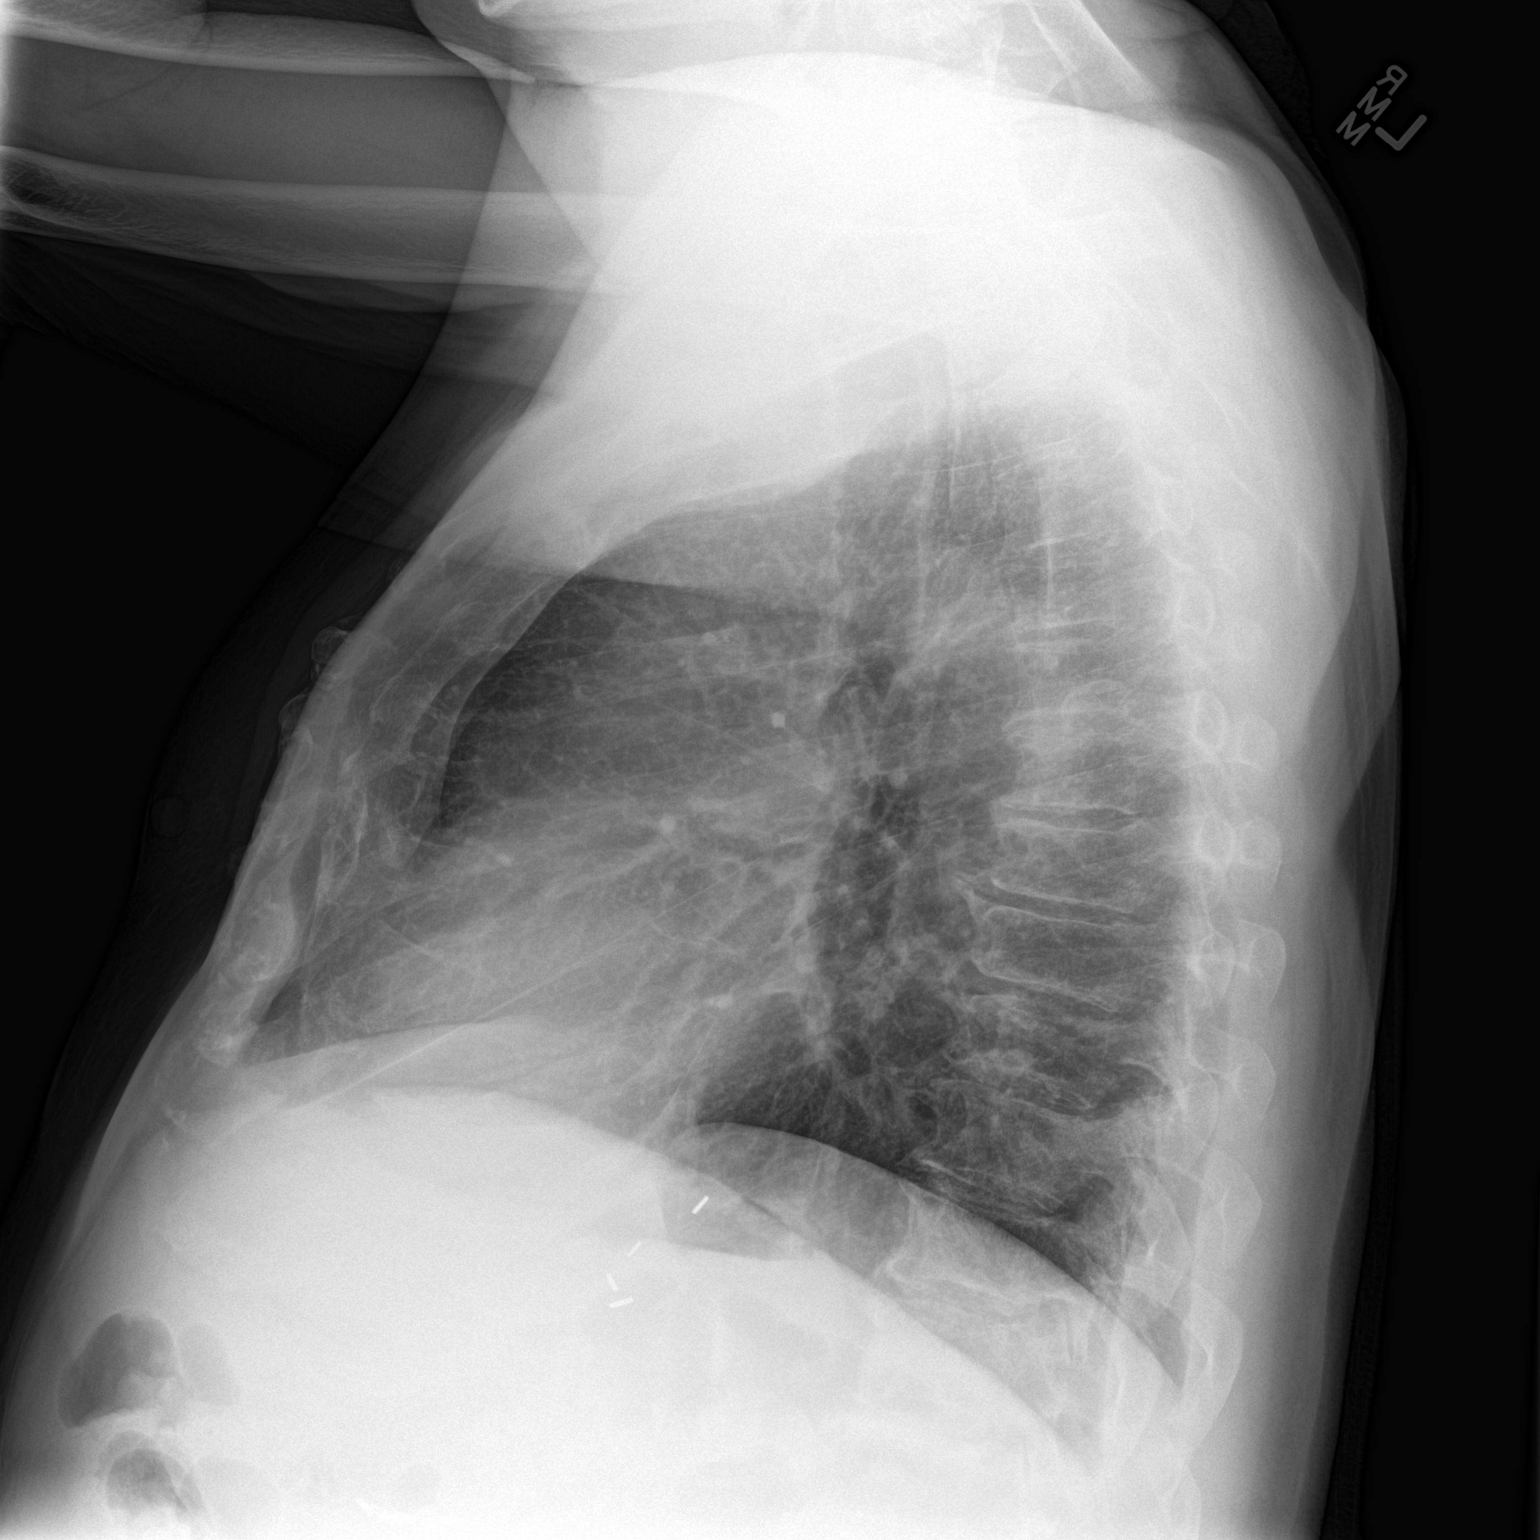

[2 of 2 positions shown; findings below may reference images not displayed]

FINDINGS: The heart and mediastinal contours are normal. There is mild
perihilar peribronchial thickening. There are no focal
consolidations or pleural effusions.
IMPRESSION: 1. Mild bronchitic changes.
2.  No focal acute pulmonary abnormality.

## 2015-09-24 ENCOUNTER — Ambulatory Visit (INDEPENDENT_AMBULATORY_CARE_PROVIDER_SITE_OTHER): Payer: Medicare Other | Admitting: Pharmacist

## 2015-09-24 DIAGNOSIS — I480 Paroxysmal atrial fibrillation: Secondary | ICD-10-CM | POA: Diagnosis not present

## 2015-09-24 DIAGNOSIS — Z7901 Long term (current) use of anticoagulants: Secondary | ICD-10-CM | POA: Diagnosis not present

## 2015-09-24 LAB — POCT INR: INR: 2.2

## 2015-09-27 ENCOUNTER — Other Ambulatory Visit: Payer: Self-pay | Admitting: Cardiology

## 2015-09-27 NOTE — Telephone Encounter (Signed)
REFILL 

## 2015-10-01 IMAGING — DX DG WRIST 2V*L*
2 series · 2 of 2 positions shown · non-contrast
Comparison: None.

CLINICAL DATA: Bruising to left arm. Numbness and pain to left
hand.

EXAM:
LEFT WRIST - 2 VIEW

[wrist pa]
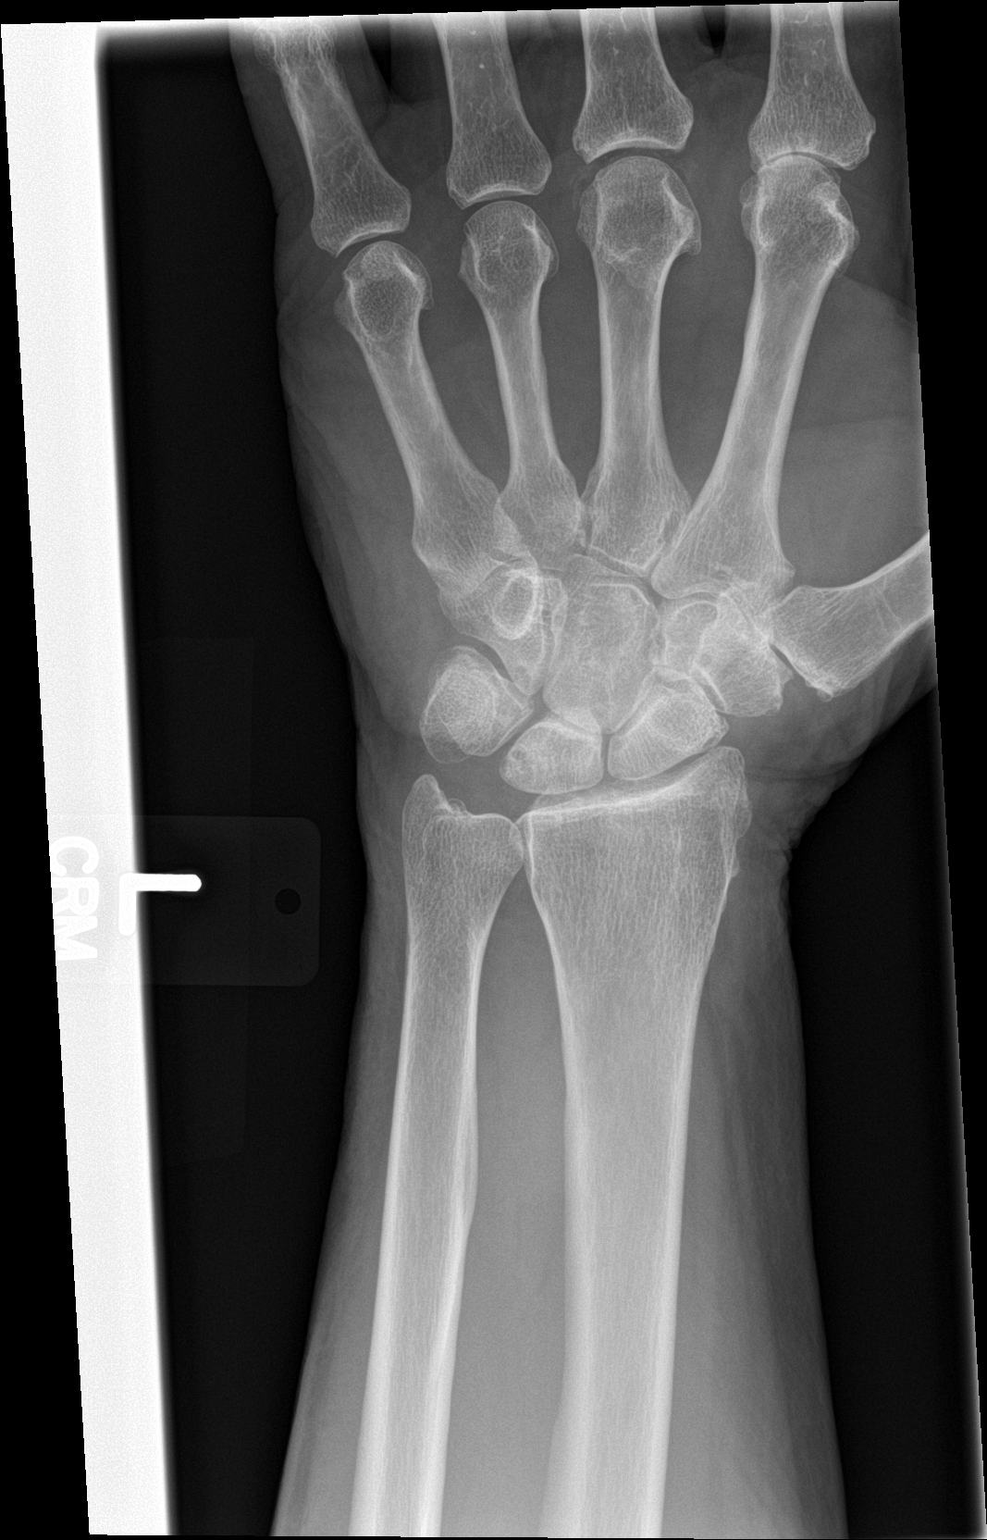

[wrist lat]
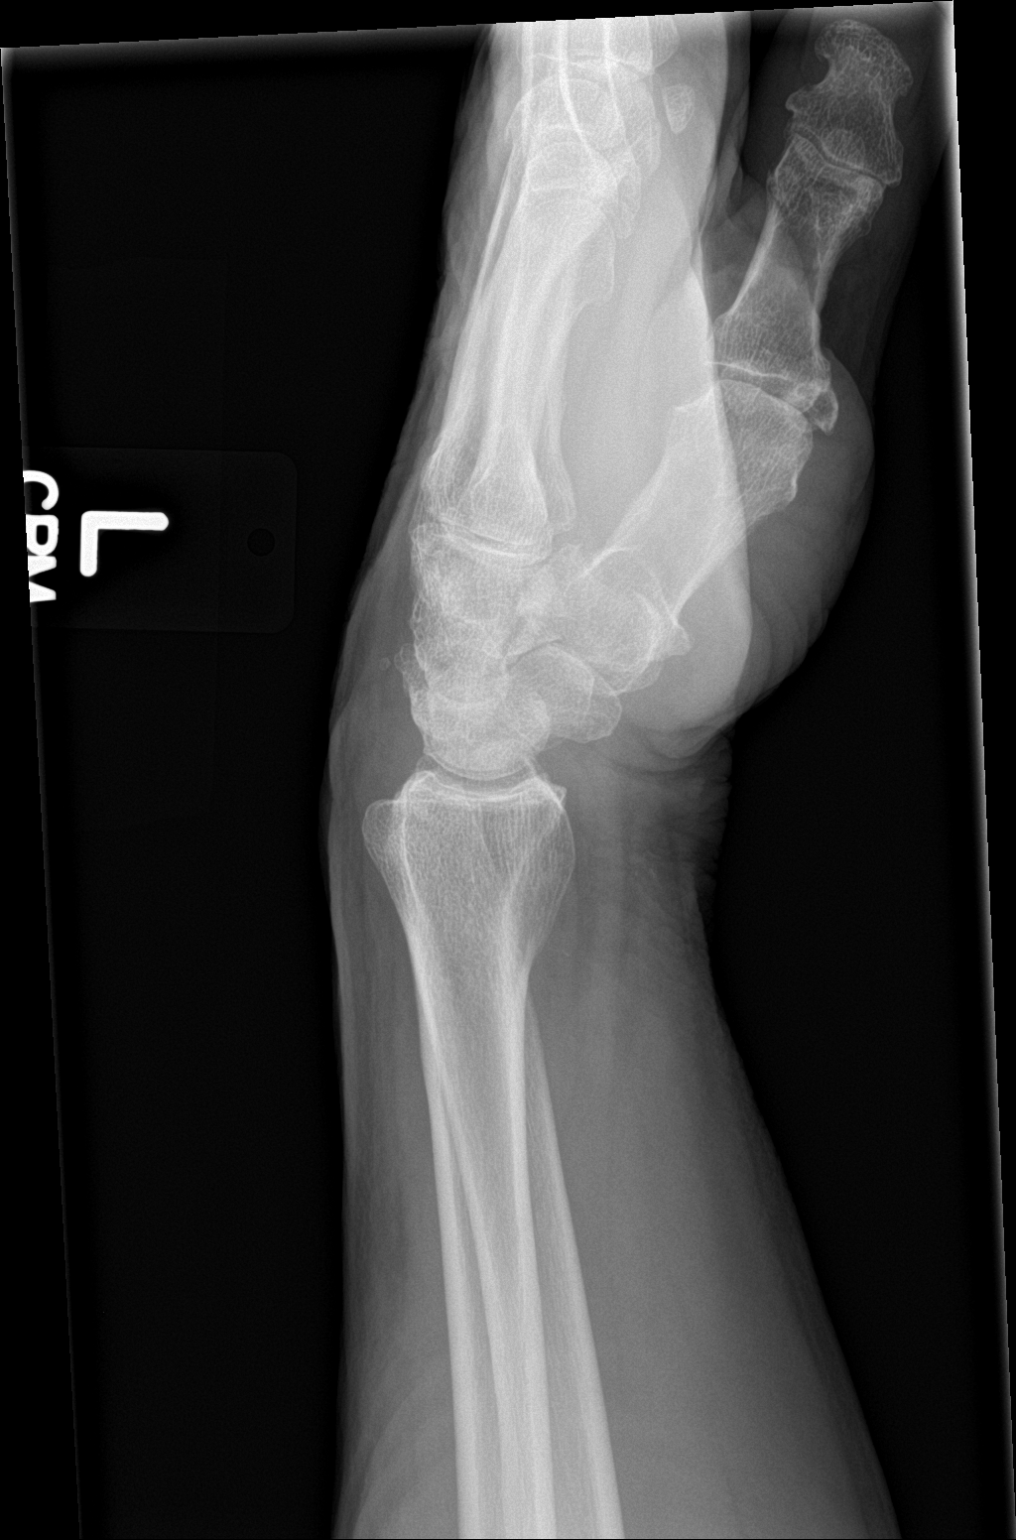

[2 of 2 positions shown; findings below may reference images not displayed]

FINDINGS: Mild degenerative changes noted at the basilar joint. Radiocarpal
joint degenerative changes also noted. There is no acute fracture or
subluxation identified. Mild dorsal soft tissue swelling identified.
IMPRESSION: 1. Degenerative changes
2. Dorsal soft tissue swelling.

## 2015-10-02 IMAGING — DX DG CHEST 2V
2 series · 2 of 2 positions shown · non-contrast
Comparison: Prior chest x-ray 07/06/2014

CLINICAL DATA: 74-year-old male with sudden onset intermittent
left-sided chest pain

EXAM:
CHEST  2 VIEW

[chest pa]
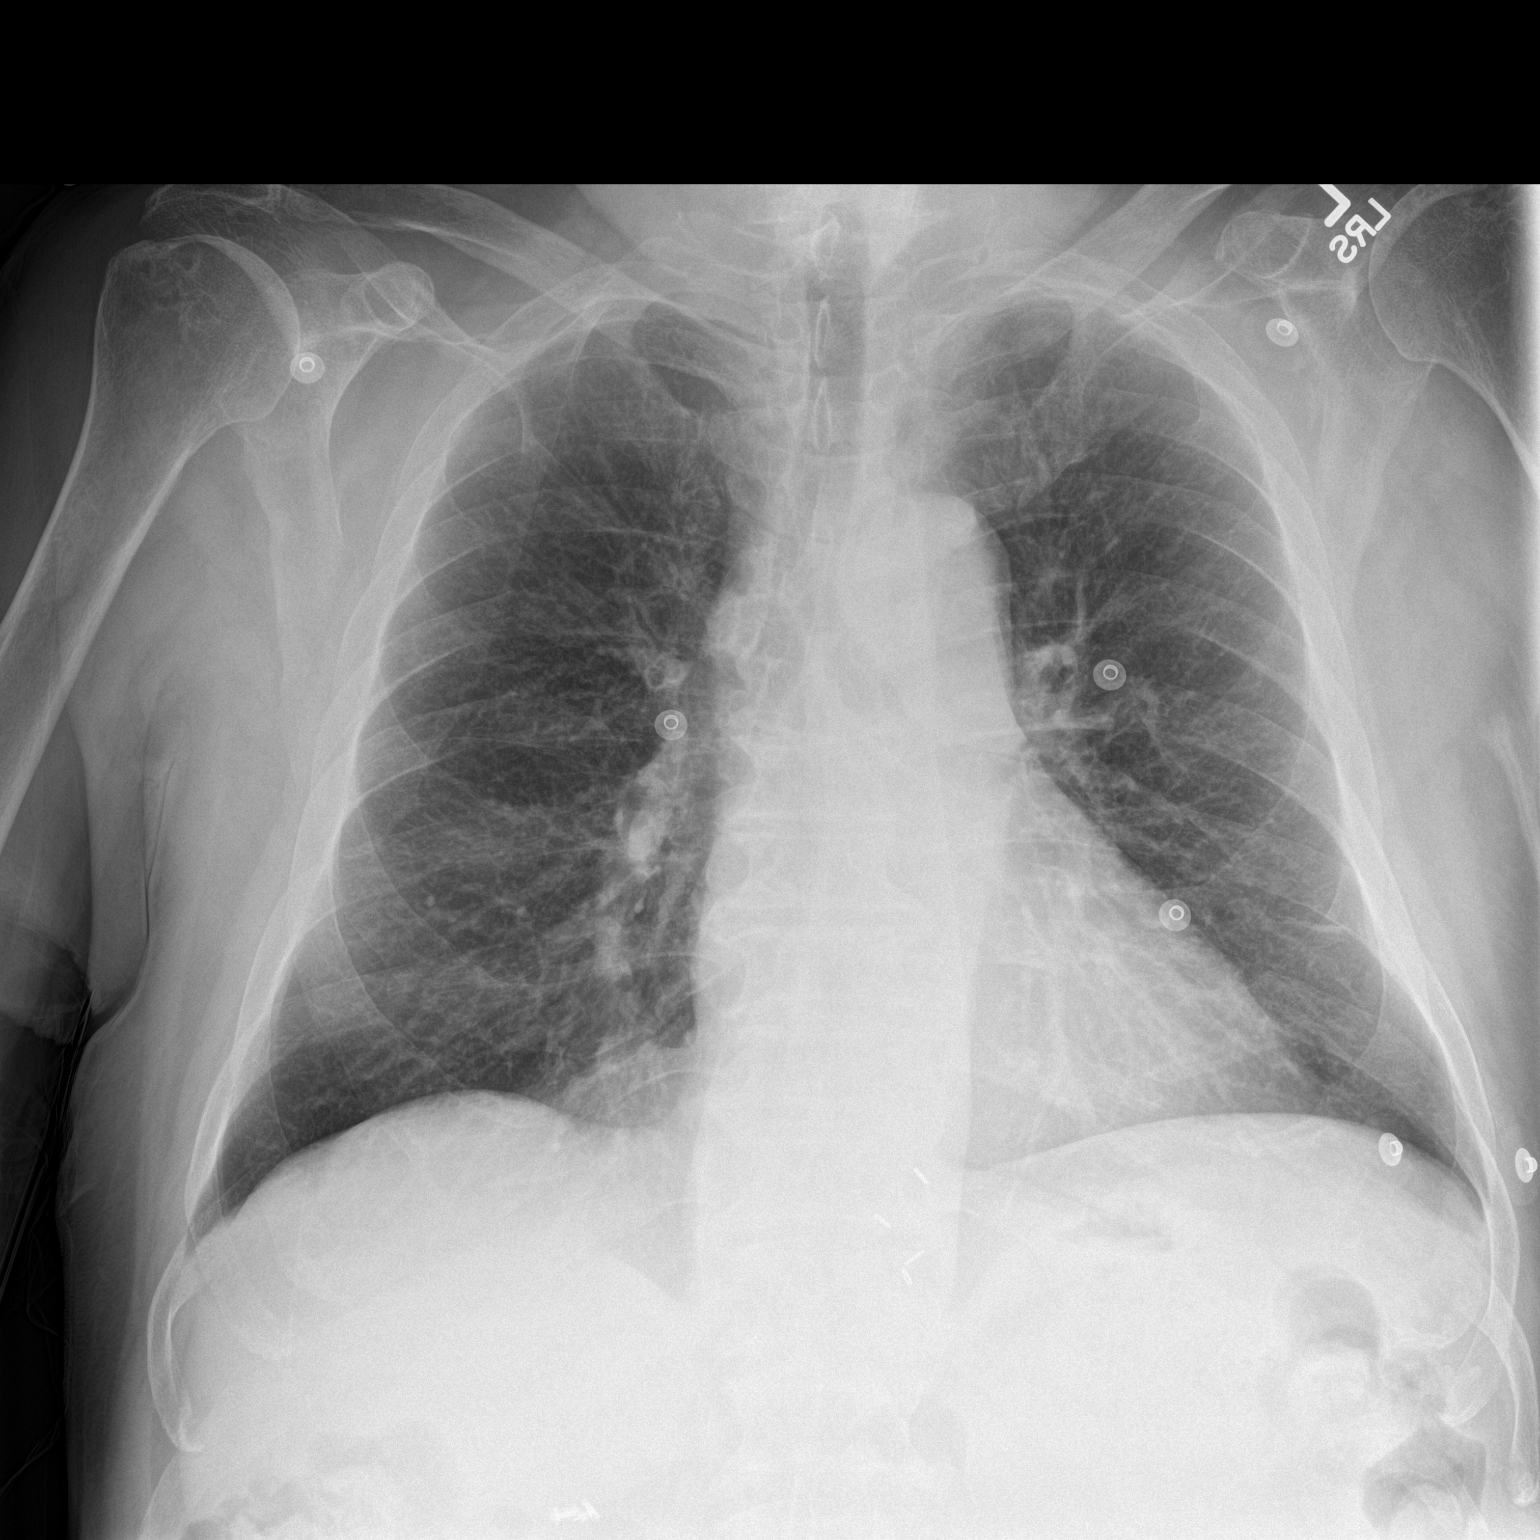

[chest lat]
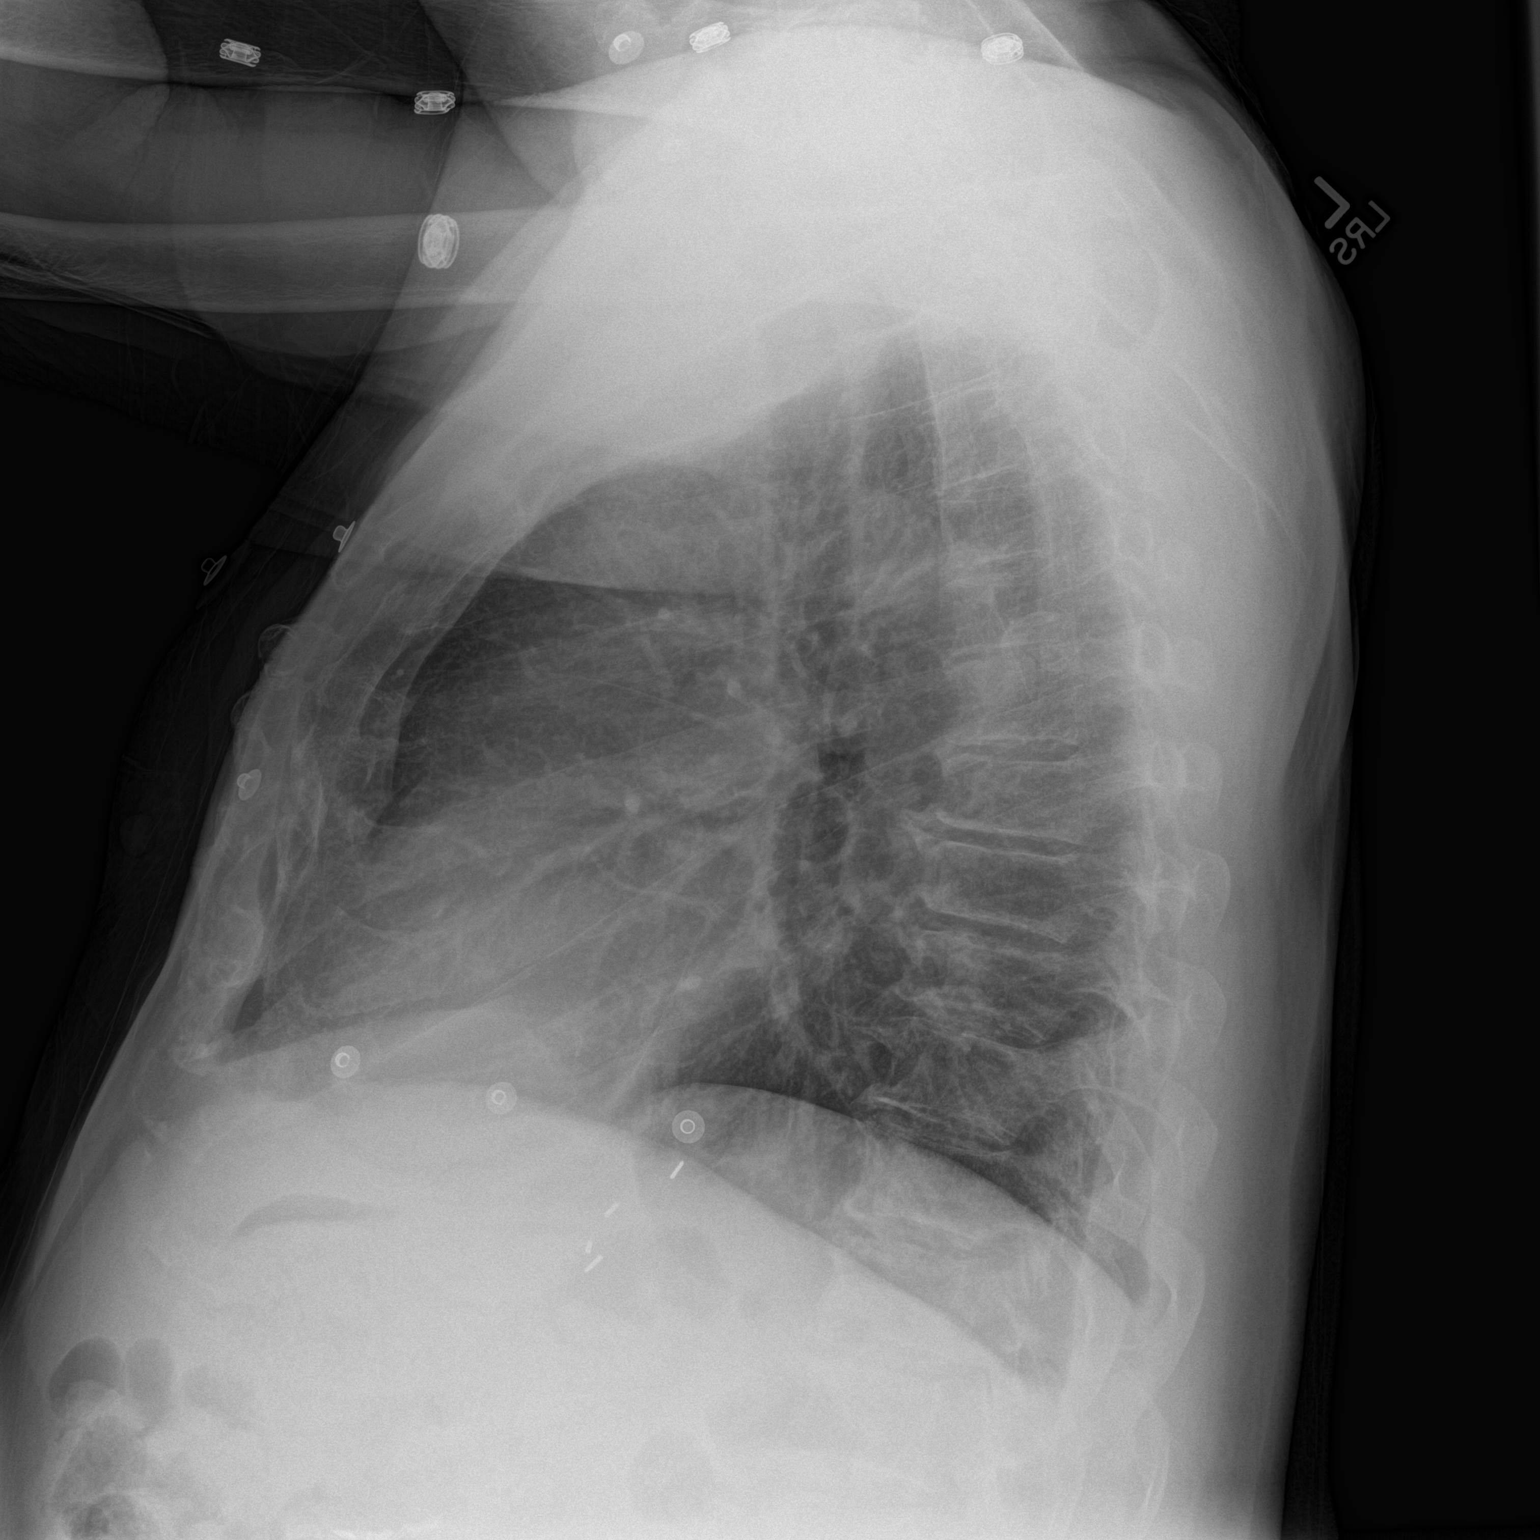

[2 of 2 positions shown; findings below may reference images not displayed]

FINDINGS: The lungs are clear and negative for focal airspace consolidation,
pulmonary edema or suspicious pulmonary nodule. Stable mild central
bronchitic change. No pleural effusion or pneumothorax. Cardiac and
mediastinal contours are within normal limits. No acute fracture or
lytic or blastic osseous lesions. Multilevel degenerative endplate
spurring throughout the spine. The visualized upper abdominal bowel
gas pattern is unremarkable.
IMPRESSION: No active cardiopulmonary disease.

## 2015-10-17 ENCOUNTER — Encounter (HOSPITAL_COMMUNITY): Payer: Self-pay | Admitting: Emergency Medicine

## 2015-10-17 ENCOUNTER — Emergency Department (HOSPITAL_COMMUNITY)
Admission: EM | Admit: 2015-10-17 | Discharge: 2015-10-17 | Disposition: A | Discharge status: Home / Self Care | Payer: Medicare Other | Attending: Emergency Medicine | Admitting: Emergency Medicine

## 2015-10-17 DIAGNOSIS — I5032 Chronic diastolic (congestive) heart failure: Secondary | ICD-10-CM | POA: Diagnosis not present

## 2015-10-17 DIAGNOSIS — Z7901 Long term (current) use of anticoagulants: Secondary | ICD-10-CM | POA: Diagnosis not present

## 2015-10-17 DIAGNOSIS — Z7982 Long term (current) use of aspirin: Secondary | ICD-10-CM | POA: Insufficient documentation

## 2015-10-17 DIAGNOSIS — M542 Cervicalgia: Secondary | ICD-10-CM | POA: Diagnosis present

## 2015-10-17 DIAGNOSIS — J449 Chronic obstructive pulmonary disease, unspecified: Secondary | ICD-10-CM | POA: Diagnosis not present

## 2015-10-17 DIAGNOSIS — F17200 Nicotine dependence, unspecified, uncomplicated: Secondary | ICD-10-CM | POA: Diagnosis not present

## 2015-10-17 DIAGNOSIS — E119 Type 2 diabetes mellitus without complications: Secondary | ICD-10-CM | POA: Insufficient documentation

## 2015-10-17 DIAGNOSIS — I2510 Atherosclerotic heart disease of native coronary artery without angina pectoris: Secondary | ICD-10-CM | POA: Diagnosis not present

## 2015-10-17 DIAGNOSIS — Z7984 Long term (current) use of oral hypoglycemic drugs: Secondary | ICD-10-CM | POA: Insufficient documentation

## 2015-10-17 DIAGNOSIS — Z794 Long term (current) use of insulin: Secondary | ICD-10-CM | POA: Insufficient documentation

## 2015-10-17 DIAGNOSIS — G8929 Other chronic pain: Secondary | ICD-10-CM | POA: Insufficient documentation

## 2015-10-17 DIAGNOSIS — I110 Hypertensive heart disease with heart failure: Secondary | ICD-10-CM | POA: Insufficient documentation

## 2015-10-17 LAB — PROTIME-INR
INR: 1.95 — ABNORMAL HIGH (ref 0.00–1.49)
PROTHROMBIN TIME: 22.1 s — AB (ref 11.6–15.2)

## 2015-10-17 MED ORDER — OXYCODONE-ACETAMINOPHEN 5-325 MG PO TABS
1.0000 | ORAL_TABLET | Freq: Once | ORAL | Status: AC
  Administered 2015-10-17: 1 via ORAL
  Filled 2015-10-17: qty 1

## 2015-10-17 NOTE — ED Provider Notes (Signed)
[facility] DEPT Provider Note   CSN: [id number] Arrival date & time: 10/17/15  [id number]  First Provider Contact:  First MD Initiated Contact with Patient 10/17/15 [phone]        History   Chief Complaint Chief Complaint  Patient presents with  . Neck Pain    HPI [name] is a 75 y.o. male.  HPI   [name] is a 75 y.o. male with PMH significant for COPD, CHF, DM, CAD, PAF anticoagulated on warfarin, chronic neck pain due to cervical disc disease BIB EMS who presents with neck pain. No injury or trauma. Patient reports over the last several weeks she's been experiencing intermittently, gradual onset, worsening neck pain that consistent with his chronic neck pain. He states that it will radiate to his bilateral ears and is worse with movement he saw his primary care doctor 3 days ago who increased his hydrocodone prescription to 1 tablet per day. He states this morning approximately 3 AM he got up and felt generally weak throughout his upper body prompting his visit. He denies any chest pain, fever, shortness of breath, cough, palpitations, visual disturbance, facial droop, slurred speech, or numbness. He does endorse a posterior headache that is worse with movement. He received 50 g of Fentanyl en route which improved his symptoms, however he states the pain is beginning to return.  Past Medical History:  Diagnosis Date  . Anemia    takes Ferrous Sulfate daily  . Arthritis    "all over"  . Atrial flutter ([place])    a. 07/2010 Status post caval tricuspid isthmus ablation by Dr. Midge Aver Metoprolol daily  . Balance problem 01/2014  . CAP (community acquired pneumonia) 09/18/2014  . Cervical radiculopathy due to degenerative joint disease of spine   . COPD (chronic obstructive pulmonary disease) ([place])   . Coronary artery disease, non-occlusive    a. 03/2010 Nonocclusive disease by cath, performed for ST elevations on ECG;  b. 06/2013 Lexi MV: EF 60%, no ischemia.  .  Diabetes mellitus type II    takes Metformin and Lantus daily  . Diastolic CHF, chronic ([place])    a. 12/2012 EF 55-60%, diast dysfxn, triv MR, mildly dil LA/RA.  [name] Dysrhythmia    HX OF ATRIAL IFB /FLUTTER takes Flecanide and Coumadin daily  . History of blood transfusion 1982   "when I had stomach OR"  . History of bronchitis    1998  . History of gastric ulcer   . HTN (hypertension)    takes Diltiazem daily  . Hyperlipidemia    takes Pravastatin daily  . Joint pain   . PAF (paroxysmal atrial fibrillation) ([place])    a. Recurrent after atrial flutter, currently controlled on flecainide plus diltiazem  . Pneumonia 1999  . Weakness    numbness and tingling both hands    Patient Active Problem List   Diagnosis Date Noted  . Syncope 05/15/2015  . Cervical disc disease 05/15/2015  . Abnormal urinalysis 05/15/2015  . Anemia 05/15/2015  . Normocytic anemia 03/10/2015  . History of CVA (cerebrovascular accident) 04/03/2014  . Near syncope 02/15/2014  . Pre-syncope 02/15/2014  . Benign neoplasm of rectum and anal canal 12/02/2013  . Lower gastrointestinal bleeding 11/30/2013  . Lower GI bleed 11/30/2013  . Tobacco use disorder 11/30/2013  . PAF (paroxysmal atrial fibrillation), maintaining SR 08/15/2013  . Anticoagulation goal of INR 2 to 3, for PAF  08/15/2013  . Chest pain with low risk for cardiac etiology 07/22/2013  . Type II  or unspecified type diabetes mellitus without mention of complication, uncontrolled 01/22/2013  . Tobacco abuse counseling 12/15/2012  . Hyperlipidemia   . Obesity (BMI 30-39.9) 12/14/2012  . Atrial flutter - status post CTI ablation 07/29/2010  . Essential hypertension 07/29/2010  . Chronic diastolic congestive heart failure ([place]) 07/29/2010  . Coronary artery disease, non-occlusive 07/29/2010    Past Surgical History:  Procedure Laterality Date  . ATRIAL ABLATION SURGERY  08/05/10   CTI ablation for atrial flutter by [facility]  . CARDIAC CATHETERIZATION   2012   nl LV function, no occlusive CAD, PAF  . CARDIOVERSION  12/07/2010    Successful direct current cardioversion with atrial fibrillation to normal sinus rhythm  . CARPAL TUNNEL RELEASE Bilateral 01/30/2014   Procedure: BILATERAL CARPAL TUNNEL RELEASE;  Surgeon: Marianna Payment, MD;  Location: [facility];  Service: Orthopedics;  Laterality: Bilateral;  . CATARACT EXTRACTION W/ INTRAOCULAR LENS  IMPLANT, BILATERAL Bilateral   . COLONOSCOPY [name] 12/02/2013   Procedure: COLONOSCOPY;  Surgeon: Irene Shipper, MD;  Location: [facility];  Service: Endoscopy;  Laterality: [name];  . ESOPHAGOGASTRODUODENOSCOPY [name] 09/22/2014   Procedure: ESOPHAGOGASTRODUODENOSCOPY (EGD);  Surgeon: Ronald Lobo, MD;  Location: [facility] ENDOSCOPY;  Service: Endoscopy;  Laterality: [name];  . INCISION AND DRAINAGE ABSCESS / HEMATOMA OF BURSA / KNEE / THIGH Left 1998   knee  . KNEE BURSECTOMY Left 1998  . LAPAROSCOPIC CHOLECYSTECTOMY  03/2010  . NM MYOVIEW LTD  07/22/2013   Normal EF ~60%, no ischemia or infarction.  [name] PARTIAL GASTRECTOMY  1982   subtotal; "took out 30% for ulcers"  . TRANSTHORACIC ECHOCARDIOGRAM  02/16/2014   EF 60%, no RWMA. - otherwise normal  . YAG LASER APPLICATION Bilateral        Home Medications    Prior to Admission medications   Medication Sig Start Date End Date Taking? Authorizing Provider  acetaminophen (TYLENOL) 500 MG tablet Take 1 tablet (500 mg total) by mouth 2 (two) times daily. Patient taking differently: Take 1,000 mg by mouth 2 (two) times daily.  03/12/15  Yes Domenic Moras, PA-C  acetaminophen (TYLENOL) 650 MG CR tablet Take 650 mg by mouth 2 (two) times daily as needed for pain.   Yes Historical Provider, MD  aspirin EC 81 MG tablet Take 81 mg by mouth daily.   Yes Historical Provider, MD  diltiazem (CARDIZEM CD) 120 MG 24 hr capsule Take 1 capsule (120 mg total) by mouth daily. 03/19/15  Yes Erlene Quan, PA-C  ferrous sulfate 325 (65 FE) MG tablet Take 325 mg by mouth daily with  breakfast.   Yes Historical Provider, MD  flecainide (TAMBOCOR) 150 MG tablet TAKE 1 AND 1/2 TABLETS TWICE DAILY 09/27/15  Yes Leonie Man, MD  HYDROcodone-acetaminophen (NORCO/VICODIN) 5-325 MG tablet Take 0.5 tablets by mouth every 6 (six) hours as needed for severe pain. 09/01/15  Yes Jaime Pilcher Ward, PA-C  hydrocortisone cream 1 % Apply 1 application topically 2 (two) times daily as needed for itching.  04/27/15  Yes Historical Provider, MD  Insulin Glargine (LANTUS) 100 UNIT/ML Solostar Pen Inject 2 Units into the skin 2 (two) times daily.    Yes Historical Provider, MD  loratadine (CLARITIN) 10 MG tablet Take 10 mg by mouth every evening.    Yes Historical Provider, MD  metFORMIN (GLUCOPHAGE) 1000 MG tablet Take 1,000 mg by mouth 2 (two) times daily with a meal.    Yes Historical Provider, MD  metoprolol succinate (TOPROL-XL) 25 MG 24 hr tablet Take  0.5 tablets (12.5 mg total) by mouth every evening. 07/06/15  Yes Leonie Man, MD  Multiple Vitamin (MULTIVITAMIN WITH MINERALS) TABS tablet Take 1 tablet by mouth every evening.    Yes Historical Provider, MD  pravastatin (PRAVACHOL) 40 MG tablet TAKE 1 TABLET EVERY EVENING 10/26/14  Yes Leonie Man, MD  tamsulosin (FLOMAX) 0.4 MG CAPS capsule Take 0.4 mg by mouth every morning. 04/19/15  Yes Historical Provider, MD  warfarin (COUMADIN) 5 MG tablet TAKE 1 TO 1 AND 1/2 TABLETS DAILY AS DIRECTED Patient taking differently: TAKE 1 TO 1 AND 1/2 TABLETS DAILY AS DIRECTED every evening, 5 mg Mon, Wed, and Fri.  7.5 mg all other days 04/06/15  Yes Troy Sine, MD  nitroGLYCERIN (NITROSTAT) 0.4 MG SL tablet Place 1 tablet (0.4 mg total) under the tongue every 5 (five) minutes x 3 doses as needed for chest pain. 05/22/14   Leonie Man, MD    Family History Family History  Problem Relation Age of Onset  . Cancer Mother   . Heart attack Father   . Heart attack Brother     Social History Social History  Substance Use Topics  . Smoking  status: Current Every Day Smoker    Packs/day: 0.50    Years: 55.00    Types: Cigarettes  . Smokeless tobacco: Never Used     Comment: He's been smoking between 0.5 and 2 ppd since age 40.  [name] Alcohol use No     Comment: "quit drinking in 1986"     Allergies   Review of patient's allergies indicates no known allergies.   Review of Systems Review of Systems All other systems negative unless otherwise stated in HPI   Physical Exam Updated Vital Signs BP 130/72   Pulse 66   Temp 98.6 F (37 C) (Oral)   Resp 18   SpO2 96%   Physical Exam  Constitutional: He appears well-developed and well-nourished.  HENT:  Head: Normocephalic and atraumatic.  Eyes: Conjunctivae are normal.  Neck: Neck supple.  Cardiovascular: Normal rate, regular rhythm and intact distal pulses.   No murmur heard. Pulses:      Radial pulses are 2+ on the right side, and 2+ on the left side.  Pulmonary/Chest: Effort normal and breath sounds normal. No respiratory distress.  Abdominal: Soft. There is no tenderness.  Musculoskeletal: He exhibits no edema.  Diffuse cervical spinous process tenderness.  No step offs.  Limited lateral flexion bilaterally due to pain.   Neurological: He is alert.  Mental Status:   AOx3.  Speech clear without dysarthria. Cranial Nerves:  I-not tested  II-PERRLA  III, IV, VI-EOMs intact  V-temporal and masseter strength intact  VII-symmetrical facial movements intact, no facial droop  VIII-hearing grossly intact bilaterally  IX, X-gag intact  XI-strength of sternomastoid and trapezius muscles 5/5  XII-tongue midline Motor:   Good muscle bulk and tone  Strength 5/5 bilaterally in upper and lower extremities   Cerebellar--intact RAMs, finger to nose intact bilaterally.  Gait normal.  No pronator drift Sensory:  Intact in upper and lower extremities   Skin: Skin is warm and dry.  Psychiatric: He has a normal mood and affect.  Nursing note and vitals  reviewed.    ED Treatments / Results  Labs (all labs ordered are listed, but only abnormal results are displayed) Labs Reviewed  PROTIME-INR - Abnormal; Notable for the following:       Result Value   Prothrombin Time 22.1 (*)  INR 1.95 (*)    All other components within normal limits    EKG  EKG Interpretation None       Radiology No results found.  Procedures Procedures (including critical care time)  Medications Ordered in ED Medications  oxyCODONE-acetaminophen (PERCOCET/ROXICET) 5-325 MG per tablet 1 tablet (1 tablet Oral Given 10/17/15 0926)  oxyCODONE-acetaminophen (PERCOCET/ROXICET) 5-325 MG per tablet 1 tablet (1 tablet Oral Given 10/17/15 1158)     Initial Impression / Assessment and Plan / ED Course  I have reviewed the triage vital signs and the nursing notes.  Patient presents with chronic neck pain. Symptoms are worsened with movement. No numbness or weakness. He does complain of posterior headache as well. This is likely related to his neck pain as it is also exacerbated with movement and palpation. No new injury or trauma. VSS, NAD.  Afebrile.  Appears well non-toxic. No focal neurological deficits.  He is anticoagulated on warfarin. Will check INR here. Patient has been given Percocet for his pain.  Patient's pain improved with 2 Percocet in ED. Discussed with patient that this is likely his chronic back pain and he needs to follow-up with his family care physician for narcotic pain medication. PT/INR 1.95. Low suspicion for intracranial bleed or infection. Return precautions discussed. Patient agrees and acknowledges the above plan for discharge.  Case has been discussed with and seen by Dr. Laverta Baltimore who agrees with the above plan for discharge.  Pertinent labs & imaging results that were available during my care of the patient were reviewed by me and considered in my medical decision making (see chart for details).  Clinical Course    Final Clinical  Impressions(s) / ED Diagnoses   Final diagnoses:  Chronic neck pain    New Prescriptions New Prescriptions   No medications on file     Vivien Rossetti 10/17/15 [place], MD 10/17/15 [phone]

## 2015-10-17 NOTE — ED Notes (Signed)
PA Rose at bedside

## 2015-10-17 NOTE — ED Triage Notes (Signed)
Pt arrives via [facility] from home, ems reports pt c/o neck pain and posterior headache x5 days. Pt saw pcp a few days ago but received no official diagnosis. EMS reports pt unable to touch chin to chest, pt received 50 mcg fentanyl pta. Pt a/ox4, nad.

## 2015-10-18 IMAGING — CR DG CHEST 2V
2 series · 2 of 2 positions shown · non-contrast
Comparison: 07/22/2014

CLINICAL DATA: Cough. Seen a couple weeks ago for bronchitis.
Generalized body aches with persistent cough.

EXAM:
CHEST  2 VIEW

[w chest pa]
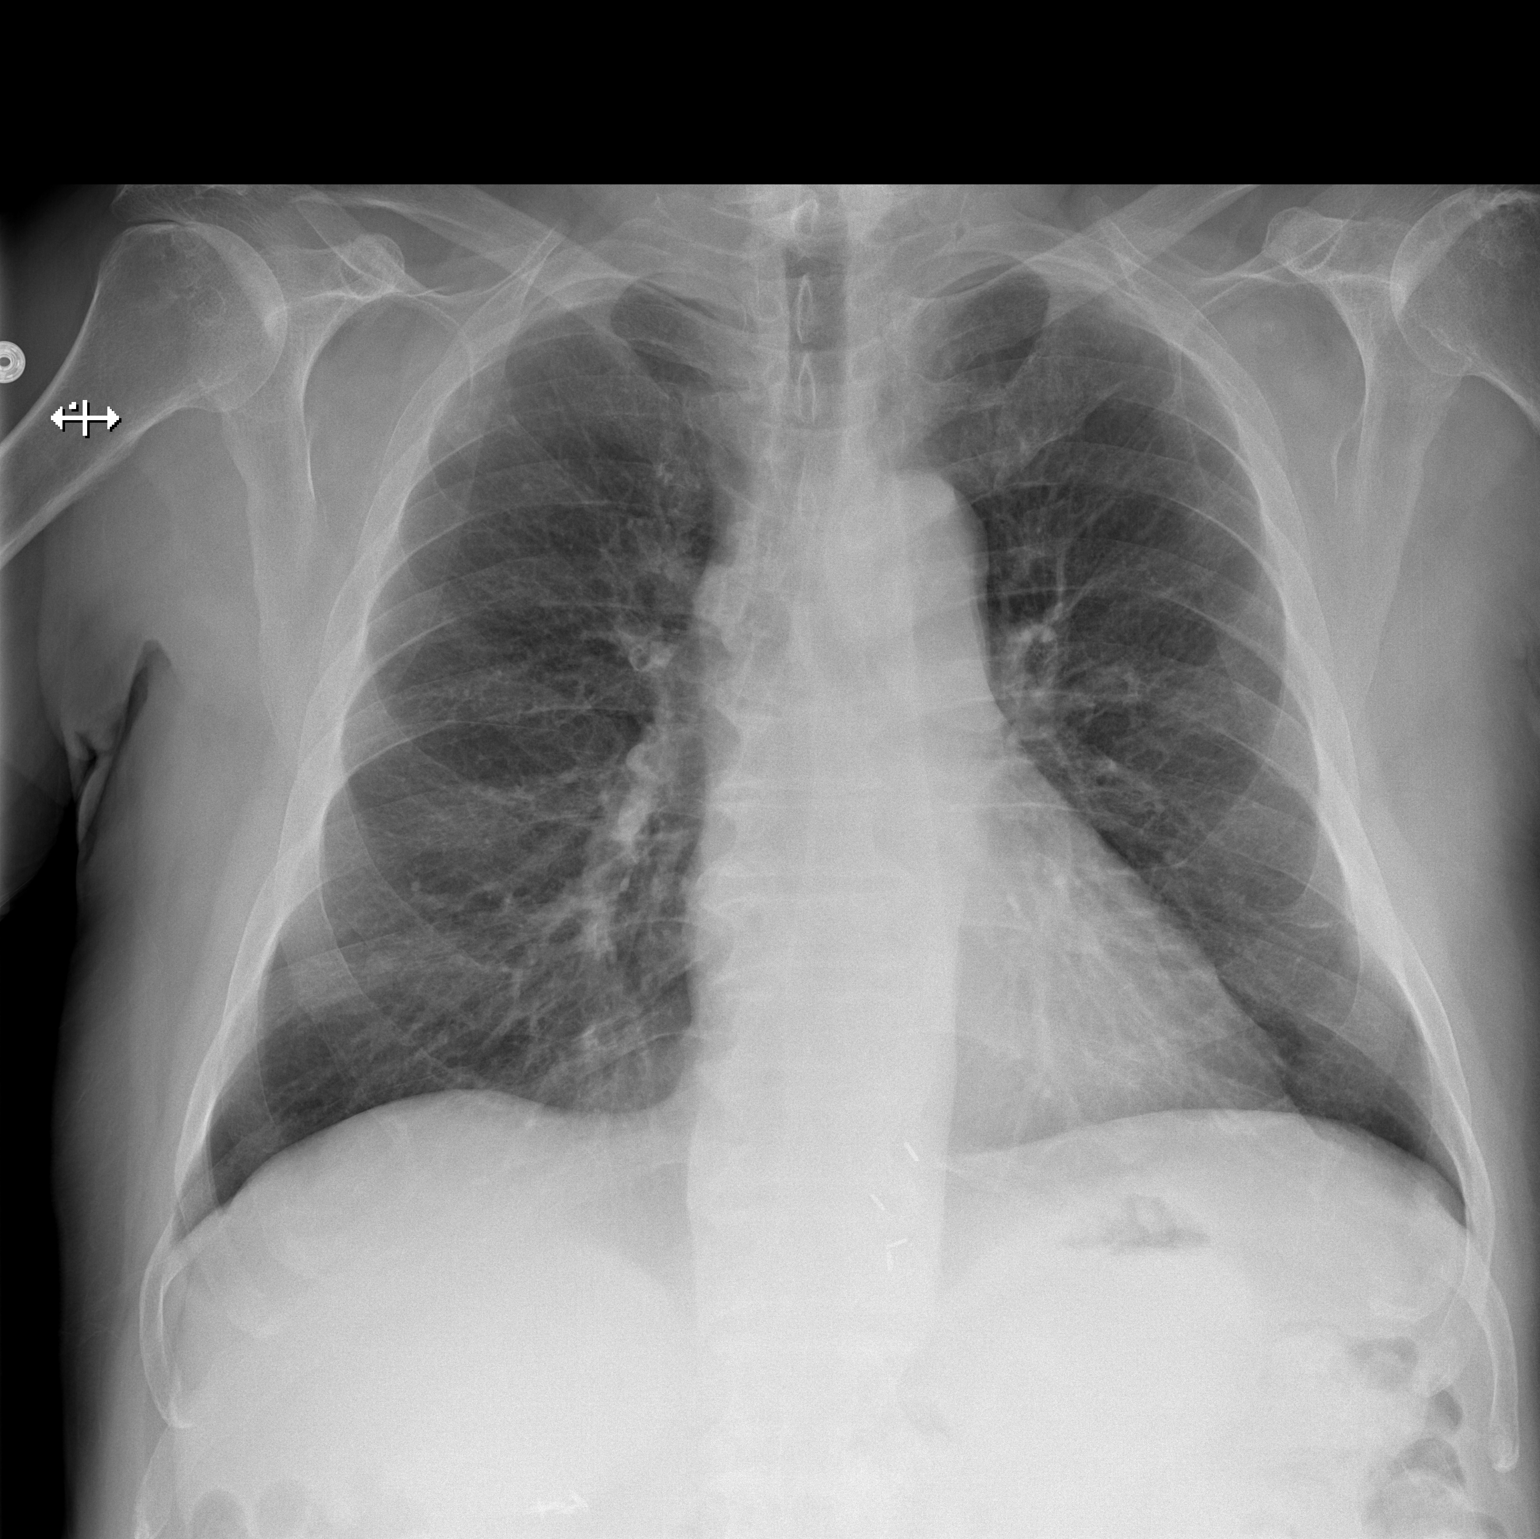

[w chest lat]
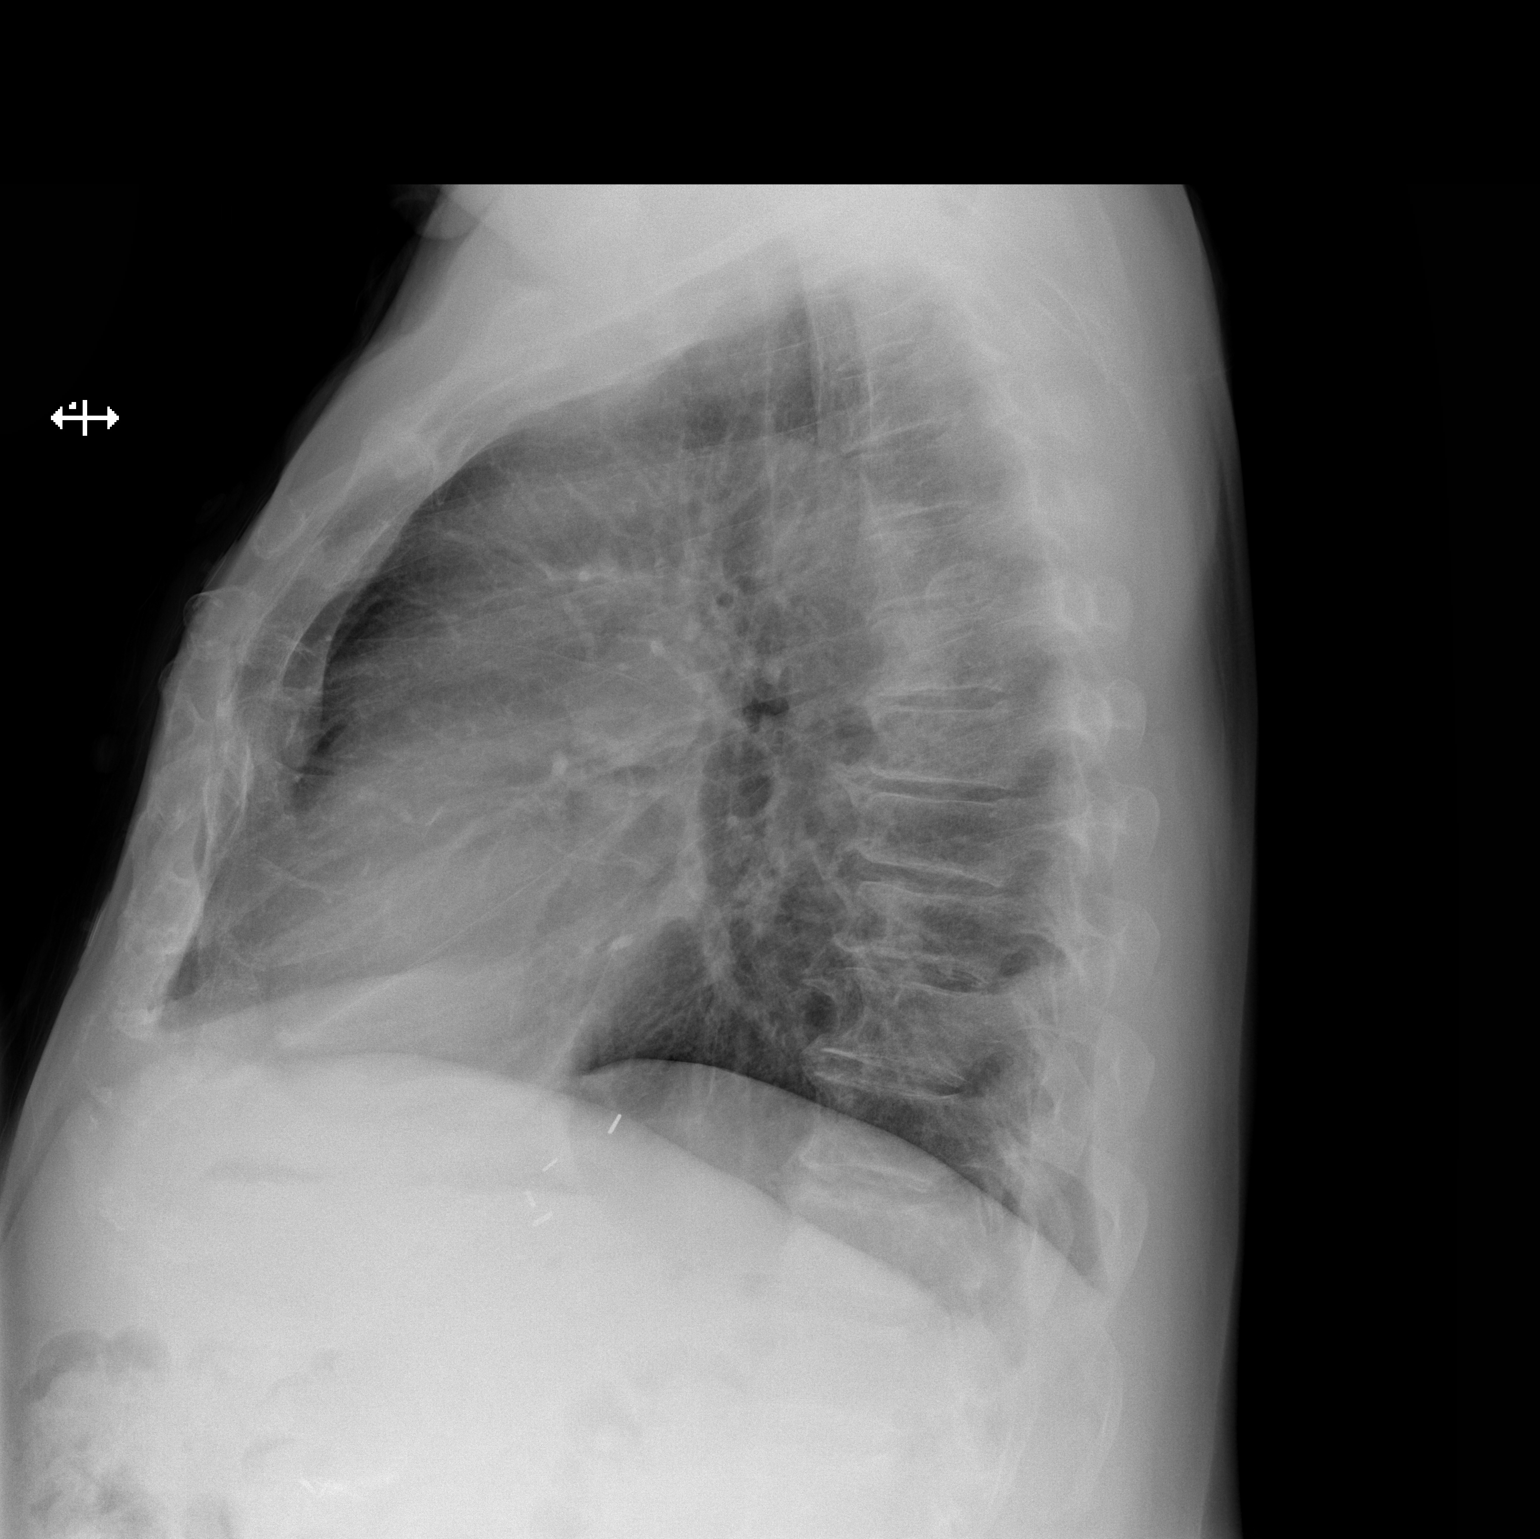

[2 of 2 positions shown; findings below may reference images not displayed]

FINDINGS: Normal heart size and pulmonary vascularity. No focal airspace
disease or consolidation in the lungs. No blunting of costophrenic
angles. No pneumothorax. Mediastinal contours appear intact.
Degenerative changes in the spine and shoulders. Surgical clips at
the EG junction.
IMPRESSION: No active cardiopulmonary disease.

## 2015-10-19 ENCOUNTER — Emergency Department (HOSPITAL_COMMUNITY): Payer: Medicare Other

## 2015-10-19 ENCOUNTER — Emergency Department (HOSPITAL_COMMUNITY)
Admission: EM | Admit: 2015-10-19 | Discharge: 2015-10-19 | Disposition: A | Discharge status: Home / Self Care | Payer: Medicare Other | Attending: Emergency Medicine | Admitting: Emergency Medicine

## 2015-10-19 ENCOUNTER — Encounter (HOSPITAL_COMMUNITY): Payer: Self-pay | Admitting: *Deleted

## 2015-10-19 DIAGNOSIS — Z85048 Personal history of other malignant neoplasm of rectum, rectosigmoid junction, and anus: Secondary | ICD-10-CM | POA: Diagnosis not present

## 2015-10-19 DIAGNOSIS — M47812 Spondylosis without myelopathy or radiculopathy, cervical region: Secondary | ICD-10-CM

## 2015-10-19 DIAGNOSIS — J449 Chronic obstructive pulmonary disease, unspecified: Secondary | ICD-10-CM | POA: Insufficient documentation

## 2015-10-19 DIAGNOSIS — I110 Hypertensive heart disease with heart failure: Secondary | ICD-10-CM | POA: Insufficient documentation

## 2015-10-19 DIAGNOSIS — I2510 Atherosclerotic heart disease of native coronary artery without angina pectoris: Secondary | ICD-10-CM | POA: Insufficient documentation

## 2015-10-19 DIAGNOSIS — R51 Headache: Secondary | ICD-10-CM | POA: Insufficient documentation

## 2015-10-19 DIAGNOSIS — M542 Cervicalgia: Secondary | ICD-10-CM | POA: Insufficient documentation

## 2015-10-19 DIAGNOSIS — E119 Type 2 diabetes mellitus without complications: Secondary | ICD-10-CM | POA: Insufficient documentation

## 2015-10-19 DIAGNOSIS — Z7901 Long term (current) use of anticoagulants: Secondary | ICD-10-CM | POA: Insufficient documentation

## 2015-10-19 DIAGNOSIS — I5032 Chronic diastolic (congestive) heart failure: Secondary | ICD-10-CM | POA: Insufficient documentation

## 2015-10-19 DIAGNOSIS — Z7982 Long term (current) use of aspirin: Secondary | ICD-10-CM | POA: Diagnosis not present

## 2015-10-19 DIAGNOSIS — F17210 Nicotine dependence, cigarettes, uncomplicated: Secondary | ICD-10-CM | POA: Diagnosis not present

## 2015-10-19 DIAGNOSIS — Z794 Long term (current) use of insulin: Secondary | ICD-10-CM | POA: Diagnosis not present

## 2015-10-19 DIAGNOSIS — Z7984 Long term (current) use of oral hypoglycemic drugs: Secondary | ICD-10-CM | POA: Insufficient documentation

## 2015-10-19 DIAGNOSIS — M4692 Unspecified inflammatory spondylopathy, cervical region: Secondary | ICD-10-CM | POA: Diagnosis not present

## 2015-10-19 MED ORDER — SODIUM CHLORIDE 0.9 % IV SOLN
1000.0000 mL | Freq: Once | INTRAVENOUS | Status: AC
  Administered 2015-10-19: 1000 mL via INTRAVENOUS

## 2015-10-19 MED ORDER — KETOROLAC TROMETHAMINE 30 MG/ML IJ SOLN: 30.0000 mg | Freq: Once | INTRAMUSCULAR | Status: DC

## 2015-10-19 MED ORDER — SODIUM CHLORIDE 0.9 % IV SOLN
1000.0000 mL | INTRAVENOUS | Status: DC
  Administered 2015-10-19: 1000 mL via INTRAVENOUS

## 2015-10-19 MED ORDER — METOCLOPRAMIDE HCL 5 MG/ML IJ SOLN
10.0000 mg | Freq: Once | INTRAMUSCULAR | Status: AC
  Administered 2015-10-19: 10 mg via INTRAVENOUS
  Filled 2015-10-19: qty 2

## 2015-10-19 MED ORDER — ORPHENADRINE CITRATE ER 100 MG PO TB12: 100.0000 mg | ORAL_TABLET | Freq: Two times a day (BID) | ORAL | 0 refills | Status: DC

## 2015-10-19 MED ORDER — HYDROCODONE-ACETAMINOPHEN 5-325 MG PO TABS: 0.5000 | ORAL_TABLET | ORAL | 0 refills | Status: DC | PRN

## 2015-10-19 MED ORDER — DIPHENHYDRAMINE HCL 50 MG/ML IJ SOLN
25.0000 mg | Freq: Once | INTRAMUSCULAR | Status: AC
  Administered 2015-10-19: 25 mg via INTRAVENOUS
  Filled 2015-10-19: qty 1

## 2015-10-19 MED ORDER — METHOCARBAMOL 1000 MG/10ML IJ SOLN
1000.0000 mg | Freq: Once | INTRAVENOUS | Status: AC
  Administered 2015-10-19: 1000 mg via INTRAVENOUS
  Filled 2015-10-19: qty 10

## 2015-10-19 MED ORDER — MORPHINE SULFATE (PF) 4 MG/ML IV SOLN
4.0000 mg | Freq: Once | INTRAVENOUS | Status: AC
  Administered 2015-10-19: 4 mg via INTRAVENOUS
  Filled 2015-10-19: qty 1

## 2015-10-19 NOTE — ED Triage Notes (Signed)
Per EMS: pt coming from home with c/o HA for 3 months. Pt denies any recent fall or injury, ems stroke scale negative.

## 2015-10-19 NOTE — ED Provider Notes (Signed)
[facility] DEPT Provider Note   CSN: [id number] Arrival date & time: 10/19/15  0208  First Provider Contact:  First MD Initiated Contact with Patient 10/19/15 [phone]        History   Chief Complaint Chief Complaint  Patient presents with  . Headache    HPI [name] is a 75 y.o. male.  The history is provided by the patient.  Headache    He has history of COPD, diabetes, arthritis, atrial fibrillation, diastolic heart failure, hypertension. He comes in with approximately two-month history of pain in the back of his neck with some radiation to the occiput. It is gotten worse over the last several weeks. It is worse with any movement. He is currently rated at 10/10. He denies any weakness, numbness, tingling. His PCP has been giving him hydrocodone-acetaminophen, but only enough to take 1 day. He was seen here 2 days ago and was given a single dose of hydrocodone-acetaminophen but was not given any prescriptions to take at home. He is taking acetaminophen which is not giving him relief.  Past Medical History:  Diagnosis Date  . Anemia    takes Ferrous Sulfate daily  . Arthritis    "all over"  . Atrial flutter ([place])    a. 07/2010 Status post caval tricuspid isthmus ablation by Dr. Midge Aver Metoprolol daily  . Balance problem 01/2014  . CAP (community acquired pneumonia) 09/18/2014  . Cervical radiculopathy due to degenerative joint disease of spine   . COPD (chronic obstructive pulmonary disease) ([place])   . Coronary artery disease, non-occlusive    a. 03/2010 Nonocclusive disease by cath, performed for ST elevations on ECG;  b. 06/2013 Lexi MV: EF 60%, no ischemia.  . Diabetes mellitus type II    takes Metformin and Lantus daily  . Diastolic CHF, chronic ([place])    a. 12/2012 EF 55-60%, diast dysfxn, triv MR, mildly dil LA/RA.  [name] Dysrhythmia    HX OF ATRIAL IFB /FLUTTER takes Flecanide and Coumadin daily  . History of blood transfusion 1982   "when I had stomach OR"    . History of bronchitis    1998  . History of gastric ulcer   . HTN (hypertension)    takes Diltiazem daily  . Hyperlipidemia    takes Pravastatin daily  . Joint pain   . PAF (paroxysmal atrial fibrillation) ([place])    a. Recurrent after atrial flutter, currently controlled on flecainide plus diltiazem  . Pneumonia 1999  . Weakness    numbness and tingling both hands    Patient Active Problem List   Diagnosis Date Noted  . Syncope 05/15/2015  . Cervical disc disease 05/15/2015  . Abnormal urinalysis 05/15/2015  . Anemia 05/15/2015  . Normocytic anemia 03/10/2015  . History of CVA (cerebrovascular accident) 04/03/2014  . Near syncope 02/15/2014  . Pre-syncope 02/15/2014  . Benign neoplasm of rectum and anal canal 12/02/2013  . Lower gastrointestinal bleeding 11/30/2013  . Lower GI bleed 11/30/2013  . Tobacco use disorder 11/30/2013  . PAF (paroxysmal atrial fibrillation), maintaining SR 08/15/2013  . Anticoagulation goal of INR 2 to 3, for PAF  08/15/2013  . Chest pain with low risk for cardiac etiology 07/22/2013  . Type II or unspecified type diabetes mellitus without mention of complication, uncontrolled 01/22/2013  . Tobacco abuse counseling 12/15/2012  . Hyperlipidemia   . Obesity (BMI 30-39.9) 12/14/2012  . Atrial flutter - status post CTI ablation 07/29/2010  . Essential hypertension 07/29/2010  . Chronic diastolic congestive heart  failure ([place]) 07/29/2010  . Coronary artery disease, non-occlusive 07/29/2010    Past Surgical History:  Procedure Laterality Date  . ATRIAL ABLATION SURGERY  08/05/10   CTI ablation for atrial flutter by [facility]  . CARDIAC CATHETERIZATION  2012   nl LV function, no occlusive CAD, PAF  . CARDIOVERSION  12/07/2010    Successful direct current cardioversion with atrial fibrillation to normal sinus rhythm  . CARPAL TUNNEL RELEASE Bilateral 01/30/2014   Procedure: BILATERAL CARPAL TUNNEL RELEASE;  Surgeon: Marianna Payment, MD;  Location: [facility];  Service: Orthopedics;  Laterality: Bilateral;  . CATARACT EXTRACTION W/ INTRAOCULAR LENS  IMPLANT, BILATERAL Bilateral   . COLONOSCOPY [name] 12/02/2013   Procedure: COLONOSCOPY;  Surgeon: Irene Shipper, MD;  Location: [facility];  Service: Endoscopy;  Laterality: [name];  . ESOPHAGOGASTRODUODENOSCOPY [name] 09/22/2014   Procedure: ESOPHAGOGASTRODUODENOSCOPY (EGD);  Surgeon: Ronald Lobo, MD;  Location: [facility] ENDOSCOPY;  Service: Endoscopy;  Laterality: [name];  . INCISION AND DRAINAGE ABSCESS / HEMATOMA OF BURSA / KNEE / THIGH Left 1998   knee  . KNEE BURSECTOMY Left 1998  . LAPAROSCOPIC CHOLECYSTECTOMY  03/2010  . NM MYOVIEW LTD  07/22/2013   Normal EF ~60%, no ischemia or infarction.  [name] PARTIAL GASTRECTOMY  1982   subtotal; "took out 30% for ulcers"  . TRANSTHORACIC ECHOCARDIOGRAM  02/16/2014   EF 60%, no RWMA. - otherwise normal  . YAG LASER APPLICATION Bilateral        Home Medications    Prior to Admission medications   Medication Sig Start Date End Date Taking? Authorizing Provider  acetaminophen (TYLENOL) 500 MG tablet Take 1 tablet (500 mg total) by mouth 2 (two) times daily. Patient taking differently: Take 1,000 mg by mouth 2 (two) times daily.  03/12/15   Domenic Moras, PA-C  acetaminophen (TYLENOL) 650 MG CR tablet Take 650 mg by mouth 2 (two) times daily as needed for pain.    Historical Provider, MD  aspirin EC 81 MG tablet Take 81 mg by mouth daily.    Historical Provider, MD  diltiazem (CARDIZEM CD) 120 MG 24 hr capsule Take 1 capsule (120 mg total) by mouth daily. 03/19/15   Erlene Quan, PA-C  ferrous sulfate 325 (65 FE) MG tablet Take 325 mg by mouth daily with breakfast.    Historical Provider, MD  flecainide (TAMBOCOR) 150 MG tablet TAKE 1 AND 1/2 TABLETS TWICE DAILY 09/27/15   Leonie Man, MD  HYDROcodone-acetaminophen (NORCO/VICODIN) 5-325 MG tablet Take 0.5 tablets by mouth every 6 (six) hours as needed for severe pain. 09/01/15   Ozella Almond Ward, PA-C  hydrocortisone  cream 1 % Apply 1 application topically 2 (two) times daily as needed for itching.  04/27/15   Historical Provider, MD  Insulin Glargine (LANTUS) 100 UNIT/ML Solostar Pen Inject 2 Units into the skin 2 (two) times daily.     Historical Provider, MD  loratadine (CLARITIN) 10 MG tablet Take 10 mg by mouth every evening.     Historical Provider, MD  metFORMIN (GLUCOPHAGE) 1000 MG tablet Take 1,000 mg by mouth 2 (two) times daily with a meal.     Historical Provider, MD  metoprolol succinate (TOPROL-XL) 25 MG 24 hr tablet Take 0.5 tablets (12.5 mg total) by mouth every evening. 07/06/15   Leonie Man, MD  Multiple Vitamin (MULTIVITAMIN WITH MINERALS) TABS tablet Take 1 tablet by mouth every evening.     Historical Provider, MD  nitroGLYCERIN (NITROSTAT) 0.4 MG SL tablet Place 1 tablet (  0.4 mg total) under the tongue every 5 (five) minutes x 3 doses as needed for chest pain. 05/22/14   Leonie Man, MD  pravastatin (PRAVACHOL) 40 MG tablet TAKE 1 TABLET EVERY EVENING 10/26/14   Leonie Man, MD  tamsulosin [facility]) 0.4 MG CAPS capsule Take 0.4 mg by mouth every morning. 04/19/15   Historical Provider, MD  warfarin (COUMADIN) 5 MG tablet TAKE 1 TO 1 AND 1/2 TABLETS DAILY AS DIRECTED Patient taking differently: TAKE 1 TO 1 AND 1/2 TABLETS DAILY AS DIRECTED every evening, 5 mg Mon, Wed, and Fri.  7.5 mg all other days 04/06/15   Troy Sine, MD    Family History Family History  Problem Relation Age of Onset  . Cancer Mother   . Heart attack Father   . Heart attack Brother     Social History Social History  Substance Use Topics  . Smoking status: Current Every Day Smoker    Packs/day: 0.50    Years: 55.00    Types: Cigarettes  . Smokeless tobacco: Never Used     Comment: He's been smoking between 0.5 and 2 ppd since age 33.  [name] Alcohol use No     Comment: "quit drinking in 1986"     Allergies   Review of patient's allergies indicates no known allergies.   Review of Systems Review  of Systems  Neurological: Positive for headaches.  All other systems reviewed and are negative.    Physical Exam Updated Vital Signs BP 125/60 (BP Location: Right Arm)   Pulse 82   Temp 98.9 F (37.2 C) (Oral)   Resp 21   Ht 5\' 6"  (1.676 m)   Wt 178 lb 8 oz (81 kg)   SpO2 98%   BMI 28.81 kg/m   Physical Exam  Nursing note and vitals reviewed.  75 year old male, who appears uncomfortable, but is in no acute distress. Vital signs are normal. Oxygen saturation is 98%, which is normal. Head is normocephalic and atraumatic. PERRLA, EOMI. Oropharynx is clear. Neck is tender at the insertion of the left paracervical muscles. There is pain with passive range of motion of the head. There is no adenopathy or JVD. Back is nontender and there is no CVA tenderness. Lungs are clear without rales, wheezes, or rhonchi. Chest is nontender. Heart has regular rate and rhythm without murmur. Abdomen is soft, flat, nontender without masses or hepatosplenomegaly and peristalsis is normoactive. Extremities have no cyanosis or edema, full range of motion is present. Skin is warm and dry without rash. Neurologic: Mental status is normal, cranial nerves are intact, there are no motor or sensory deficits.   ED Treatments / Results  Labs (all labs ordered are listed, but only abnormal results are displayed) Labs Reviewed - No data to display  EKG  EKG Interpretation None       Radiology Ct Head Wo Contrast  Result Date: 10/19/2015 CLINICAL DATA:  Complains of a headache at the vertex. Posterior lateral neck pain on both sides for 3-4 months. No injury. EXAM: CT HEAD WITHOUT CONTRAST CT CERVICAL SPINE WITHOUT CONTRAST TECHNIQUE: Multidetector CT imaging of the head and cervical spine was performed following the standard protocol without intravenous contrast. Multiplanar CT image reconstructions of the cervical spine were also generated. COMPARISON:  Brain MRI 05/16/2015.  Cervical spine CT  10/09/2014 FINDINGS: CT HEAD FINDINGS Diffuse low density throughout the white matter is suggestive for chronic changes and minimally changed from the previous examination. There is an  old lacune infarct involving the right caudate head. Cerebral atrophy is unchanged. No evidence for acute hemorrhage, mass lesion, midline shift, hydrocephalus or large infarct. Visualized paranasal sinuses and mastoid air appearance cells are clear. No calvarial fracture. CT CERVICAL SPINE FINDINGS The lung apices are clear. Alignment of the cervical spine is within normal limits with multilevel disc space narrowing from C4 through T1. There is bilateral facet arthropathy. There is increased bone destruction or erosions along the left posterior aspect of the dens. In addition, there is increased cortical destruction involving the left posterior aspect of the C1 body. There appears to be a soft tissue pannus formation involving these erosive changes. There does not appear to be significantly increased mass effect in the spinal canal related to these changes at C1-C2 but this could be better characterized with MRI. Again noted is central canal narrowing in cervical spine better characterized on the previous MRI. No significant soft tissue swelling in the paravertebral soft tissues. No evidence for a fracture. IMPRESSION: No acute intracranial abnormality. Stable white matter changes suggestive for chronic small vessel ischemic disease. Evidence of an old lacune infarct involving the right caudate head. Progression of erosive changes with presumed pannus formation involving the left side of the dens and left side of C1. These findings are concerning for an underlying inflammatory arthropathy. Cervical spondylosis. Electronically Signed   By: Markus Daft M.D.   On: 10/19/2015 08:35  Ct Cervical Spine Wo Contrast  Result Date: 10/19/2015 CLINICAL DATA:  Complains of a headache at the vertex. Posterior lateral neck pain on both sides  for 3-4 months. No injury. EXAM: CT HEAD WITHOUT CONTRAST CT CERVICAL SPINE WITHOUT CONTRAST TECHNIQUE: Multidetector CT imaging of the head and cervical spine was performed following the standard protocol without intravenous contrast. Multiplanar CT image reconstructions of the cervical spine were also generated. COMPARISON:  Brain MRI 05/16/2015.  Cervical spine CT 10/09/2014 FINDINGS: CT HEAD FINDINGS Diffuse low density throughout the white matter is suggestive for chronic changes and minimally changed from the previous examination. There is an old lacune infarct involving the right caudate head. Cerebral atrophy is unchanged. No evidence for acute hemorrhage, mass lesion, midline shift, hydrocephalus or large infarct. Visualized paranasal sinuses and mastoid air appearance cells are clear. No calvarial fracture. CT CERVICAL SPINE FINDINGS The lung apices are clear. Alignment of the cervical spine is within normal limits with multilevel disc space narrowing from C4 through T1. There is bilateral facet arthropathy. There is increased bone destruction or erosions along the left posterior aspect of the dens. In addition, there is increased cortical destruction involving the left posterior aspect of the C1 body. There appears to be a soft tissue pannus formation involving these erosive changes. There does not appear to be significantly increased mass effect in the spinal canal related to these changes at C1-C2 but this could be better characterized with MRI. Again noted is central canal narrowing in cervical spine better characterized on the previous MRI. No significant soft tissue swelling in the paravertebral soft tissues. No evidence for a fracture. IMPRESSION: No acute intracranial abnormality. Stable white matter changes suggestive for chronic small vessel ischemic disease. Evidence of an old lacune infarct involving the right caudate head. Progression of erosive changes with presumed pannus formation involving  the left side of the dens and left side of C1. These findings are concerning for an underlying inflammatory arthropathy. Cervical spondylosis. Electronically Signed   By: Markus Daft M.D.   On: 10/19/2015 08:35  Procedures Procedures (including critical care time)  Medications Ordered in ED Medications  0.9 %  sodium chloride infusion (0 mLs Intravenous Stopped 10/19/15 0837)    Followed by  0.9 %  sodium chloride infusion (1,000 mLs Intravenous New Bag/Given 10/19/15 0837)  metoCLOPramide (REGLAN) injection 10 mg (10 mg Intravenous Given 10/19/15 0706)  diphenhydrAMINE (BENADRYL) injection 25 mg (25 mg Intravenous Given 10/19/15 0706)  methocarbamol (ROBAXIN) 1,000 mg in dextrose 5 % 50 mL IVPB (0 mg Intravenous Stopped 10/19/15 0837)  morphine 4 MG/ML injection 4 mg (4 mg Intravenous Given 10/19/15 0837)     Initial Impression / Assessment and Plan / ED Course  I have reviewed the triage vital signs and the nursing notes.  Pertinent imaging results that were available during my care of the patient were reviewed by me and considered in my medical decision making (see chart for details).  Clinical Course    Neck pain which seems to be largely related some muscle spasm, possibly cervical osteoarthritis. No red flags to suggest more serious disease. Old records are reviewed confirming ED visit 2 days ago. I see no evidence of any imaging of the cervical spine and no recent CTs and head. These will be obtained today. He will be given a dose of methocarbamol as well as a headache cocktail of normal saline, metoclopramide, diphenhydramine. NSAIDs have to be avoided because of anticoagulated state. INR was checked 2 days ago and will not be repeated today.  He had moderate relief with above noted treatment, but symptoms got worse when he started to move. He clearly will need narcotics, at least in the short run. He is discharged with prescriptions for Phenergan and hydrocodone-acetaminophen and  referred back to his PCP.  Final Clinical Impressions(s) / ED Diagnoses   Final diagnoses:  Neck pain  Cervical arthritis (HCC)    New Prescriptions New Prescriptions   ORPHENADRINE (NORFLEX) 100 MG TABLET    Take 1 tablet (100 mg total) by mouth 2 (two) times daily.     Delora Fuel, MD [phone]

## 2015-10-19 NOTE — ED Triage Notes (Signed)
Pt reports neck pain for three months. Pt states tonight he had a sudden headache with the neck pain, pt denies headache at this time, only reports his chronic neck pain. Pt denies any recent fall or injury, denies loss of bowel or bladder.

## 2015-10-19 NOTE — Discharge Instructions (Signed)
Apply ace and/or heat to your neck. Talk with your doctor about physical therapy. You may need to be referred to a pain clinic.

## 2015-10-22 ENCOUNTER — Emergency Department (HOSPITAL_COMMUNITY)
Admission: EM | Admit: 2015-10-22 | Discharge: 2015-10-23 | Disposition: A | Discharge status: Home / Self Care | Payer: Medicare Other | Attending: Emergency Medicine | Admitting: Emergency Medicine

## 2015-10-22 ENCOUNTER — Encounter (HOSPITAL_COMMUNITY): Payer: Self-pay | Admitting: Emergency Medicine

## 2015-10-22 ENCOUNTER — Ambulatory Visit: Payer: Medicare Other | Admitting: Cardiology

## 2015-10-22 DIAGNOSIS — R519 Headache, unspecified: Secondary | ICD-10-CM

## 2015-10-22 DIAGNOSIS — F17210 Nicotine dependence, cigarettes, uncomplicated: Secondary | ICD-10-CM | POA: Diagnosis not present

## 2015-10-22 DIAGNOSIS — R51 Headache: Secondary | ICD-10-CM | POA: Diagnosis not present

## 2015-10-22 DIAGNOSIS — I5032 Chronic diastolic (congestive) heart failure: Secondary | ICD-10-CM | POA: Insufficient documentation

## 2015-10-22 DIAGNOSIS — M542 Cervicalgia: Secondary | ICD-10-CM | POA: Diagnosis not present

## 2015-10-22 DIAGNOSIS — J449 Chronic obstructive pulmonary disease, unspecified: Secondary | ICD-10-CM | POA: Diagnosis not present

## 2015-10-22 DIAGNOSIS — Z8673 Personal history of transient ischemic attack (TIA), and cerebral infarction without residual deficits: Secondary | ICD-10-CM | POA: Insufficient documentation

## 2015-10-22 DIAGNOSIS — G8929 Other chronic pain: Secondary | ICD-10-CM | POA: Diagnosis not present

## 2015-10-22 DIAGNOSIS — E119 Type 2 diabetes mellitus without complications: Secondary | ICD-10-CM | POA: Diagnosis not present

## 2015-10-22 DIAGNOSIS — I110 Hypertensive heart disease with heart failure: Secondary | ICD-10-CM | POA: Diagnosis not present

## 2015-10-22 DIAGNOSIS — I2510 Atherosclerotic heart disease of native coronary artery without angina pectoris: Secondary | ICD-10-CM | POA: Diagnosis not present

## 2015-10-22 DIAGNOSIS — Z85048 Personal history of other malignant neoplasm of rectum, rectosigmoid junction, and anus: Secondary | ICD-10-CM | POA: Insufficient documentation

## 2015-10-22 LAB — I-STAT CHEM 8, ED
BUN: 16 mg/dL (ref 6–20)
CALCIUM ION: 1.13 mmol/L (ref 1.12–1.23)
CHLORIDE: 99 mmol/L — AB (ref 101–111)
CREATININE: 1 mg/dL (ref 0.61–1.24)
Glucose, Bld: 121 mg/dL — ABNORMAL HIGH (ref 65–99)
HEMATOCRIT: 38 % — AB (ref 39.0–52.0)
Hemoglobin: 12.9 g/dL — ABNORMAL LOW (ref 13.0–17.0)
Potassium: 4.2 mmol/L (ref 3.5–5.1)
Sodium: 137 mmol/L (ref 135–145)
TCO2: 27 mmol/L (ref 0–100)

## 2015-10-22 LAB — PROTIME-INR
INR: 2.31
PROTHROMBIN TIME: 25.8 s — AB (ref 11.4–15.2)

## 2015-10-22 LAB — I-STAT TROPONIN, ED: Troponin i, poc: 0 ng/mL (ref 0.00–0.08)

## 2015-10-22 MED ORDER — DIPHENHYDRAMINE HCL 25 MG PO CAPS
50.0000 mg | ORAL_CAPSULE | Freq: Once | ORAL | Status: AC
  Administered 2015-10-22: 50 mg via ORAL
  Filled 2015-10-22: qty 2

## 2015-10-22 MED ORDER — METOCLOPRAMIDE HCL 10 MG PO TABS: 10.0000 mg | ORAL_TABLET | Freq: Three times a day (TID) | ORAL | 0 refills | Status: DC | PRN

## 2015-10-22 MED ORDER — METOCLOPRAMIDE HCL 10 MG PO TABS
10.0000 mg | ORAL_TABLET | Freq: Once | ORAL | Status: AC
Start: 2015-10-22 — End: 2015-10-22
  Administered 2015-10-22: 10 mg via ORAL
  Filled 2015-10-22: qty 1

## 2015-10-22 NOTE — Discharge Instructions (Signed)
You have been seen in the Emergency Department (ED) for a headache.  Please use Tylenol and Reglan as needed for symptoms, but only as written on the box.  As we have discussed, please follow up with your primary care doctor as soon as possible regarding today?s Emergency Department (ED) visit and your headache symptoms.    Call your doctor or return to the ED if you have a worsening headache, sudden and severe headache, confusion, slurred speech, facial droop, weakness or numbness in any arm or leg, extreme fatigue, vision problems, or other symptoms that concern you.

## 2015-10-22 NOTE — ED Provider Notes (Signed)
Emergency Department Provider Note   I have reviewed the triage vital signs and the nursing notes.   HISTORY  Chief Complaint Headache; Neck Pain; and Chest Pain   HPI [name] is a 75 y.o. male with PMH of a-flutter on coumadin, COPD, CAD, DM, diastolic CHF, presents to the emergency department for reevaluation of continued headache and chronic neck pain. The patient reports that his neck discomfort has been ongoing for the last 6 months. He describes primarily lateral neck pain on both sides. He denies any sudden worsening or change in quality of his pain. He denies any fever or neck stiffness. He was seen in the emergency department yesterday for similar set of symptoms including headache which responded to medications and IV fluids. He describes the headache is primarily in the back of his head with minimal radiation. He reports it came on gradually and has been steadily worsening throughout the day. He does not take pain medication at home and has no history of severe headache or intracranial hemorrhage. Reports pink point with his warfarin and follows his INR carefully. No weakness or numbness in his arms or legs. No difficulty walking.  The patient came by EMS and by report had endorse some chest discomfort. He denies this to me during my evaluation.    Past Medical History:  Diagnosis Date  . Anemia    takes Ferrous Sulfate daily  . Arthritis    "all over"  . Atrial flutter ([place])    a. 07/2010 Status post caval tricuspid isthmus ablation by Dr. Midge Aver Metoprolol daily  . Balance problem 01/2014  . CAP (community acquired pneumonia) 09/18/2014  . Cervical radiculopathy due to degenerative joint disease of spine   . COPD (chronic obstructive pulmonary disease) ([place])   . Coronary artery disease, non-occlusive    a. 03/2010 Nonocclusive disease by cath, performed for ST elevations on ECG;  b. 06/2013 Lexi MV: EF 60%, no ischemia.  . Diabetes mellitus type II    takes  Metformin and Lantus daily  . Diastolic CHF, chronic ([place])    a. 12/2012 EF 55-60%, diast dysfxn, triv MR, mildly dil LA/RA.  [name] Dysrhythmia    HX OF ATRIAL IFB /FLUTTER takes Flecanide and Coumadin daily  . History of blood transfusion 1982   "when I had stomach OR"  . History of bronchitis    1998  . History of gastric ulcer   . HTN (hypertension)    takes Diltiazem daily  . Hyperlipidemia    takes Pravastatin daily  . Joint pain   . PAF (paroxysmal atrial fibrillation) ([place])    a. Recurrent after atrial flutter, currently controlled on flecainide plus diltiazem  . Pneumonia 1999  . Weakness    numbness and tingling both hands    Patient Active Problem List   Diagnosis Date Noted  . Syncope 05/15/2015  . Cervical disc disease 05/15/2015  . Abnormal urinalysis 05/15/2015  . Anemia 05/15/2015  . Normocytic anemia 03/10/2015  . History of CVA (cerebrovascular accident) 04/03/2014  . Near syncope 02/15/2014  . Pre-syncope 02/15/2014  . Benign neoplasm of rectum and anal canal 12/02/2013  . Lower gastrointestinal bleeding 11/30/2013  . Lower GI bleed 11/30/2013  . Tobacco use disorder 11/30/2013  . PAF (paroxysmal atrial fibrillation), maintaining SR 08/15/2013  . Anticoagulation goal of INR 2 to 3, for PAF  08/15/2013  . Chest pain with low risk for cardiac etiology 07/22/2013  . Type II or unspecified type diabetes mellitus without mention of  complication, uncontrolled 01/22/2013  . Tobacco abuse counseling 12/15/2012  . Hyperlipidemia   . Obesity (BMI 30-39.9) 12/14/2012  . Atrial flutter - status post CTI ablation 07/29/2010  . Essential hypertension 07/29/2010  . Chronic diastolic congestive heart failure ([place]) 07/29/2010  . Coronary artery disease, non-occlusive 07/29/2010    Past Surgical History:  Procedure Laterality Date  . ATRIAL ABLATION SURGERY  08/05/10   CTI ablation for atrial flutter by [facility]  . CARDIAC CATHETERIZATION  2012   nl LV function, no  occlusive CAD, PAF  . CARDIOVERSION  12/07/2010    Successful direct current cardioversion with atrial fibrillation to normal sinus rhythm  . CARPAL TUNNEL RELEASE Bilateral 01/30/2014   Procedure: BILATERAL CARPAL TUNNEL RELEASE;  Surgeon: Marianna Payment, MD;  Location: [facility];  Service: Orthopedics;  Laterality: Bilateral;  . CATARACT EXTRACTION W/ INTRAOCULAR LENS  IMPLANT, BILATERAL Bilateral   . COLONOSCOPY [name] 12/02/2013   Procedure: COLONOSCOPY;  Surgeon: Irene Shipper, MD;  Location: [facility];  Service: Endoscopy;  Laterality: [name];  . ESOPHAGOGASTRODUODENOSCOPY [name] 09/22/2014   Procedure: ESOPHAGOGASTRODUODENOSCOPY (EGD);  Surgeon: Ronald Lobo, MD;  Location: [facility] ENDOSCOPY;  Service: Endoscopy;  Laterality: [name];  . INCISION AND DRAINAGE ABSCESS / HEMATOMA OF BURSA / KNEE / THIGH Left 1998   knee  . KNEE BURSECTOMY Left 1998  . LAPAROSCOPIC CHOLECYSTECTOMY  03/2010  . NM MYOVIEW LTD  07/22/2013   Normal EF ~60%, no ischemia or infarction.  [name] PARTIAL GASTRECTOMY  1982   subtotal; "took out 30% for ulcers"  . TRANSTHORACIC ECHOCARDIOGRAM  02/16/2014   EF 60%, no RWMA. - otherwise normal  . YAG LASER APPLICATION Bilateral     Current Outpatient Rx  . Order #: [id number] Class: Print  . Order #: [id number] Class: Historical Med  . Order #: [id number] Class: Historical Med  . Order #: [id number] Class: Normal  . Order #: [id number] Class: Historical Med  . Order #: [id number] Class: Normal  . Order #: [id number] Class: Print  . Order #: [id number] Class: Historical Med  . Order #: [id number] Class: Historical Med  . Order #: [id number] Class: Historical Med  . Order #: [id number] Class: Historical Med  . Order #: [id number] Class: Normal  . Order #: [id number] Class: Historical Med  . Order #: [id number] Class: Normal  . Order #: [id number] Class: Print  . Order #: [id number] Class: Normal  . Order #: [id number] Class: Historical Med  . Order #: [id number] Class: Normal    Allergies Review of patient's  allergies indicates no known allergies.  Family History  Problem Relation Age of Onset  . Cancer Mother   . Heart attack Father   . Heart attack Brother     Social History Social History  Substance Use Topics  . Smoking status: Current Every Day Smoker    Packs/day: 0.50    Years: 55.00    Types: Cigarettes  . Smokeless tobacco: Never Used     Comment: He's been smoking between 0.5 and 2 ppd since age 47.  [name] Alcohol use No     Comment: "quit drinking in 1986"    Review of Systems  Constitutional: No fever/chills Eyes: No visual changes. ENT: No sore throat. Cardiovascular: Denies chest pain. Respiratory: Denies shortness of breath. Gastrointestinal: No abdominal pain.  No nausea, no vomiting.  No diarrhea.  No constipation. Genitourinary: Negative for dysuria. Musculoskeletal: Negative for back pain. Skin: Negative for rash. Neurological: Negative for focal weakness or numbness. Positive HA and chronic neck pain.   10-point ROS otherwise negative.  ____________________________________________   PHYSICAL EXAM:  VITAL  SIGNS: ED Triage Vitals  Enc Vitals Group     BP 10/22/15 2115 130/78     Pulse Rate 10/22/15 2115 78     Resp 10/22/15 2115 20     Temp 10/22/15 2050 97.3 F (36.3 C)     Temp Source 10/22/15 2050 Oral     SpO2 10/22/15 2050 91 %     Weight 10/22/15 2050 178 lb (80.7 kg)     Height 10/22/15 2050 5\' 6"  (1.676 m)     Pain Score 10/22/15 2050 10   Constitutional: Alert and oriented. Well appearing and in no acute distress. Eyes: Conjunctivae are normal. PERRL. EOMI. Head: Atraumatic. Nose: No congestion/rhinnorhea. Mouth/Throat: Mucous membranes are moist.  Oropharynx non-erythematous. Neck: No stridor.  No meningeal signs. No cervical spine tenderness to palpation. Cardiovascular: Normal rate, regular rhythm. Good peripheral circulation. Grossly normal heart sounds.   Respiratory: Normal respiratory effort.  No retractions. Lungs  CTAB. Gastrointestinal: Soft and nontender. No distention.  Musculoskeletal: No lower extremity tenderness nor edema. No gross deformities of extremities. Neurologic:  Normal speech and language. No gross focal neurologic deficits are appreciated.  Skin:  Skin is warm, dry and intact. No rash noted. Psychiatric: Mood and affect are normal. Speech and behavior are normal.  ____________________________________________   LABS (all labs ordered are listed, but only abnormal results are displayed)  Labs Reviewed  PROTIME-INR - Abnormal; Notable for the following:       Result Value   Prothrombin Time 25.8 (*)    All other components within normal limits  I-STAT CHEM 8, ED - Abnormal; Notable for the following:    Chloride 99 (*)    Glucose, Bld 121 (*)    Hemoglobin 12.9 (*)    HCT 38.0 (*)    All other components within normal limits  I-STAT TROPOININ, ED   ____________________________________________  EKG  Reviewed in MUSE. No STEMI.  ____________________________________________  RADIOLOGY  None ____________________________________________   PROCEDURES  Procedure(s) performed:   Procedures  None ____________________________________________   INITIAL IMPRESSION / ASSESSMENT AND PLAN / ED COURSE  Pertinent labs & imaging results that were available during my care of the patient were reviewed by me and considered in my medical decision making (see chart for details).  Patient resents to the emergency department for evaluation of chronic neck pain and headache. Headache was gone yesterday to headache cocktail medications. Patient describes it as gradual in onset. Nonfocal neurological exam. Extremities lower suspicion for subarachnoid hemorrhage or other intracranial hemorrhage. No signs of trauma. Patient has no midline cervical spine tenderness. No evidence of meningitis or other infection. Plan for pain control in the emergency department headache diary and neurology  follow-up. Will also obtain a single troponin given report by EMS of complaining of chest pain although the patient denies this to me now.   11:33 PM Patient with significantly improved headache symptoms after oral Reglan and Benadryl. No red flag symptoms to suggest need for additional neuroimaging or invasive testing at this time. Gave prescription for Reglan and discussed the use of Tylenol and Benadryl as needed at home but only for the shortest duration. Discussed side effects in detail. Also discussed keeping a headache diary. Provided follow-up contact information with local neurologist. Patient has an appointment scheduled with his primary care physician on Monday for reassessment.   At this time, I do not feel there is any life-threatening condition present. I have reviewed and discussed all results (EKG, imaging, lab, urine as appropriate), exam findings  with patient. I have reviewed nursing notes and appropriate previous records.  I feel the patient is safe to be discharged home without further emergent workup. Discussed usual and customary return precautions. Patient and family (if present) verbalize understanding and are comfortable with this plan.  Patient will follow-up with their primary care provider. If they do not have a primary care provider, information for follow-up has been provided to them. All questions have been answered.  ____________________________________________  FINAL CLINICAL IMPRESSION(S) / ED DIAGNOSES  Final diagnoses:  Neck pain  Acute nonintractable headache, unspecified headache type     MEDICATIONS GIVEN DURING THIS VISIT:  Medications  metoCLOPramide (REGLAN) tablet 10 mg (10 mg Oral Given 10/22/15 2159)  diphenhydrAMINE (BENADRYL) capsule 50 mg (50 mg Oral Given 10/22/15 2159)     NEW OUTPATIENT MEDICATIONS STARTED DURING THIS VISIT:  New Prescriptions   METOCLOPRAMIDE (REGLAN) 10 MG TABLET    Take 1 tablet (10 mg total) by mouth every 8 (eight)  hours as needed for nausea (headache).      Note:  This document was prepared using Dragon voice recognition software and may include unintentional dictation errors.  Nanda Quinton, MD Emergency Medicine   Margette Fast, MD 10/23/15 [phone]

## 2015-10-22 NOTE — ED Notes (Signed)
Report given to Bonnie RN

## 2015-10-22 NOTE — ED Triage Notes (Signed)
Brought via EMS from home for c/o neck and head pain and chest pain.  Denies having any chest pain or headache in triage.  Was seen yesterday for the same thing and sent home.  Reports falling three times today due to weakness.  Denies any LOC or injury.  States the neck pain is the same as prior to fall.

## 2015-10-23 ENCOUNTER — Emergency Department (HOSPITAL_COMMUNITY)
Admission: EM | Admit: 2015-10-23 | Discharge: 2015-10-23 | Disposition: A | Discharge status: Home / Self Care | Payer: Medicare Other | Source: Home / Self Care | Attending: Emergency Medicine | Admitting: Emergency Medicine

## 2015-10-23 ENCOUNTER — Emergency Department (HOSPITAL_COMMUNITY): Payer: Medicare Other

## 2015-10-23 ENCOUNTER — Encounter (HOSPITAL_COMMUNITY): Payer: Self-pay | Admitting: Emergency Medicine

## 2015-10-23 DIAGNOSIS — I5032 Chronic diastolic (congestive) heart failure: Secondary | ICD-10-CM | POA: Insufficient documentation

## 2015-10-23 DIAGNOSIS — R296 Repeated falls: Secondary | ICD-10-CM

## 2015-10-23 DIAGNOSIS — I2510 Atherosclerotic heart disease of native coronary artery without angina pectoris: Secondary | ICD-10-CM | POA: Insufficient documentation

## 2015-10-23 DIAGNOSIS — M25561 Pain in right knee: Secondary | ICD-10-CM | POA: Insufficient documentation

## 2015-10-23 DIAGNOSIS — G8929 Other chronic pain: Secondary | ICD-10-CM

## 2015-10-23 DIAGNOSIS — M542 Cervicalgia: Secondary | ICD-10-CM | POA: Insufficient documentation

## 2015-10-23 DIAGNOSIS — Y92009 Unspecified place in unspecified non-institutional (private) residence as the place of occurrence of the external cause: Secondary | ICD-10-CM

## 2015-10-23 DIAGNOSIS — I110 Hypertensive heart disease with heart failure: Secondary | ICD-10-CM

## 2015-10-23 DIAGNOSIS — Y999 Unspecified external cause status: Secondary | ICD-10-CM | POA: Insufficient documentation

## 2015-10-23 DIAGNOSIS — J449 Chronic obstructive pulmonary disease, unspecified: Secondary | ICD-10-CM

## 2015-10-23 DIAGNOSIS — R51 Headache: Secondary | ICD-10-CM | POA: Insufficient documentation

## 2015-10-23 DIAGNOSIS — Y939 Activity, unspecified: Secondary | ICD-10-CM

## 2015-10-23 DIAGNOSIS — F17210 Nicotine dependence, cigarettes, uncomplicated: Secondary | ICD-10-CM | POA: Insufficient documentation

## 2015-10-23 DIAGNOSIS — Z8673 Personal history of transient ischemic attack (TIA), and cerebral infarction without residual deficits: Secondary | ICD-10-CM | POA: Insufficient documentation

## 2015-10-23 DIAGNOSIS — E119 Type 2 diabetes mellitus without complications: Secondary | ICD-10-CM

## 2015-10-23 DIAGNOSIS — W19XXXA Unspecified fall, initial encounter: Secondary | ICD-10-CM

## 2015-10-23 LAB — URINALYSIS, ROUTINE W REFLEX MICROSCOPIC
BILIRUBIN URINE: NEGATIVE
GLUCOSE, UA: NEGATIVE mg/dL
HGB URINE DIPSTICK: NEGATIVE
Ketones, ur: 15 mg/dL — AB
Nitrite: NEGATIVE
PROTEIN: NEGATIVE mg/dL
Specific Gravity, Urine: 1.022 (ref 1.005–1.030)
pH: 6 (ref 5.0–8.0)

## 2015-10-23 LAB — URINE MICROSCOPIC-ADD ON: RBC / HPF: NONE SEEN RBC/hpf (ref 0–5)

## 2015-10-23 MED ORDER — CEPHALEXIN 500 MG PO CAPS: 500.0000 mg | ORAL_CAPSULE | Freq: Four times a day (QID) | ORAL | 0 refills | Status: DC

## 2015-10-23 NOTE — Discharge Instructions (Signed)
You have been seen in the Emergency Department (ED) today for a fall.  Your work up does not show any concerning injuries.  Please take over-the-counter ibuprofen and/or Tylenol as needed for your pain (unless you have an allergy or your doctor as told you not to take them), or take any prescribed medication as instructed.  Discontinue or limit your use of Hydrocodone and Muscle Relaxer as this may contribute to falling more often at home.   Please follow up with your doctor regarding today's Emergency Department (ED) visit and your recent fall.    Return to the ED if you have any headache, confusion, slurred speech, weakness/numbness of any arm or leg, or any increased pain.

## 2015-10-23 NOTE — ED Notes (Signed)
[facility] per patient request to arrange transportation to home. Patient informed that cab fare is $20 when driver arrives. Patient has wallet and $20 cash to pay driver to transport to home. Patient being placed in lobby awaiting taxi driver arrival. No distress noted.

## 2015-10-23 NOTE — ED Provider Notes (Signed)
Emergency Department Provider Note   I have reviewed the triage vital signs and the nursing notes.   HISTORY  Chief Complaint No chief complaint on file.   HPI [name] is a 75 y.o. male with PMH of a-flutter on coumadin, COPD, CAD, DM, diastolic CHF presents to the emergency department for evaluation of frequent falls at home. Patient has been seen in the emergency department several times in the last week. During this encounter the patient's daughter is at bedside. She clarifies that the patient presented yesterday was actually for frequent falls at home. She notes the headache and neck discomfort are chronic problems. She reports the patient has been taking hydrocodone and has been newly taking a muscle relaxer with some improvement in symptoms but his walking and falling have increased. He fell 3 times yesterday and once since ED discharge. She has not noticed any fevers, confusion, or cough. No medication changes other than the addition of the muscle relaxer. The patient has been on hydrocodone for some time.    Past Medical History:  Diagnosis Date  . Anemia    takes Ferrous Sulfate daily  . Arthritis    "all over"  . Atrial flutter ([place])    a. 07/2010 Status post caval tricuspid isthmus ablation by Dr. Midge Aver Metoprolol daily  . Balance problem 01/2014  . CAP (community acquired pneumonia) 09/18/2014  . Cervical radiculopathy due to degenerative joint disease of spine   . COPD (chronic obstructive pulmonary disease) ([place])   . Coronary artery disease, non-occlusive    a. 03/2010 Nonocclusive disease by cath, performed for ST elevations on ECG;  b. 06/2013 Lexi MV: EF 60%, no ischemia.  . Diabetes mellitus type II    takes Metformin and Lantus daily  . Diastolic CHF, chronic ([place])    a. 12/2012 EF 55-60%, diast dysfxn, triv MR, mildly dil LA/RA.  [name] Dysrhythmia    HX OF ATRIAL IFB /FLUTTER takes Flecanide and Coumadin daily  . History of blood transfusion 1982   "when I had stomach OR"  . History of bronchitis    1998  . History of gastric ulcer   . HTN (hypertension)    takes Diltiazem daily  . Hyperlipidemia    takes Pravastatin daily  . Joint pain   . PAF (paroxysmal atrial fibrillation) ([place])    a. Recurrent after atrial flutter, currently controlled on flecainide plus diltiazem  . Pneumonia 1999  . Weakness    numbness and tingling both hands    Patient Active Problem List   Diagnosis Date Noted  . Syncope 05/15/2015  . Cervical disc disease 05/15/2015  . Abnormal urinalysis 05/15/2015  . Anemia 05/15/2015  . Normocytic anemia 03/10/2015  . History of CVA (cerebrovascular accident) 04/03/2014  . Near syncope 02/15/2014  . Pre-syncope 02/15/2014  . Benign neoplasm of rectum and anal canal 12/02/2013  . Lower gastrointestinal bleeding 11/30/2013  . Lower GI bleed 11/30/2013  . Tobacco use disorder 11/30/2013  . PAF (paroxysmal atrial fibrillation), maintaining SR 08/15/2013  . Anticoagulation goal of INR 2 to 3, for PAF  08/15/2013  . Chest pain with low risk for cardiac etiology 07/22/2013  . Type II or unspecified type diabetes mellitus without mention of complication, uncontrolled 01/22/2013  . Tobacco abuse counseling 12/15/2012  . Hyperlipidemia   . Obesity (BMI 30-39.9) 12/14/2012  . Atrial flutter - status post CTI ablation 07/29/2010  . Essential hypertension 07/29/2010  . Chronic diastolic congestive heart failure ([place]) 07/29/2010  . Coronary artery  disease, non-occlusive 07/29/2010    Past Surgical History:  Procedure Laterality Date  . ATRIAL ABLATION SURGERY  08/05/10   CTI ablation for atrial flutter by JA  . CARDIAC CATHETERIZATION  2012   nl LV function, no occlusive CAD, PAF  . CARDIOVERSION  12/07/2010    Successful direct current cardioversion with atrial fibrillation to normal sinus rhythm  . CARPAL TUNNEL RELEASE Bilateral 01/30/2014   Procedure: BILATERAL CARPAL TUNNEL RELEASE;  Surgeon: Marianna Payment, MD;  Location: [place];  Service: Orthopedics;  Laterality: Bilateral;  . CATARACT EXTRACTION W/ INTRAOCULAR LENS  IMPLANT, BILATERAL Bilateral   . COLONOSCOPY [name] 12/02/2013   Procedure: COLONOSCOPY;  Surgeon: Irene Shipper, MD;  Location: [place];  Service: Endoscopy;  Laterality: [name];  . ESOPHAGOGASTRODUODENOSCOPY [name] 09/22/2014   Procedure: ESOPHAGOGASTRODUODENOSCOPY (EGD);  Surgeon: Ronald Lobo, MD;  Location: [facility] ENDOSCOPY;  Service: Endoscopy;  Laterality: [name];  . INCISION AND DRAINAGE ABSCESS / HEMATOMA OF BURSA / KNEE / THIGH Left 1998   knee  . KNEE BURSECTOMY Left 1998  . LAPAROSCOPIC CHOLECYSTECTOMY  03/2010  . NM MYOVIEW LTD  07/22/2013   Normal EF ~60%, no ischemia or infarction.  [name] PARTIAL GASTRECTOMY  1982   subtotal; "took out 30% for ulcers"  . TRANSTHORACIC ECHOCARDIOGRAM  02/16/2014   EF 60%, no RWMA. - otherwise normal  . YAG LASER APPLICATION Bilateral     Current Outpatient Rx  . Order #: [id number] Class: Print  . Order #: [id number] Class: Historical Med  . Order #: [id number] Class: Historical Med  . Order #: [id number] Class: Print  . Order #: [id number] Class: Normal  . Order #: [id number] Class: Historical Med  . Order #: [id number] Class: Normal  . Order #: [id number] Class: Print  . Order #: [id number] Class: Historical Med  . Order #: [id number] Class: Historical Med  . Order #: [id number] Class: Historical Med  . Order #: [id number] Class: Historical Med  . Order #: [id number] Class: Print  . Order #: [id number] Class: Normal  . Order #: [id number] Class: Historical Med  . Order #: [id number] Class: Normal  . Order #: [id number] Class: Print  . Order #: [id number] Class: Normal  . Order #: [id number] Class: Historical Med  . Order #: [id number] Class: Normal    Allergies Review of patient's allergies indicates no known allergies.  Family History  Problem Relation Age of Onset  . Cancer Mother   . Heart attack Father   . Heart attack Brother     Social  History Social History  Substance Use Topics  . Smoking status: Current Every Day Smoker    Packs/day: 0.50    Years: 55.00    Types: Cigarettes  . Smokeless tobacco: Never Used     Comment: He's been smoking between 0.5 and 2 ppd since age 38.  [name] Alcohol use No     Comment: "quit drinking in 1986"    Review of Systems  Constitutional: No fever/chills Eyes: No visual changes. ENT: No sore throat. Cardiovascular: Denies chest pain. Respiratory: Denies shortness of breath. Gastrointestinal: No abdominal pain. No nausea, no vomiting.  No diarrhea.  No constipation. Genitourinary: Negative for dysuria. Musculoskeletal: Negative for back pain. Right knee pain after a fall.  Skin: Negative for rash. Neurological: Negative for focal weakness or numbness. Positive HA and frequent falls.   10-point ROS otherwise negative.  ____________________________________________   PHYSICAL EXAM:  VITAL SIGNS: ED Triage Vitals  Enc Vitals Group     BP 10/23/15 1703 141/72     Pulse Rate 10/23/15 1703 79     Resp  10/23/15 1703 18     Temp 10/23/15 1703 98.1 F (36.7 C)     Temp Source 10/23/15 1703 Oral     SpO2 10/23/15 1657 97 %     Weight 10/23/15 1703 178 lb (80.7 kg)     Height 10/23/15 1703 5\' 6"  (1.676 m)   Constitutional: Alert and oriented. Well appearing and in no acute distress. Eyes: Conjunctivae are normal. PERRL. EOMI. Head: Atraumatic. Nose: No congestion/rhinnorhea. Mouth/Throat: Mucous membranes are moist.  Oropharynx non-erythematous. Neck: No stridor.  No meningeal signs. No cervical spine tenderness to palpation. Cardiovascular: Normal rate, regular rhythm. Good peripheral circulation. Grossly normal heart sounds.   Respiratory: Normal respiratory effort.  No retractions. Lungs CTAB. Gastrointestinal: Soft and nontender. No distention.  Musculoskeletal: No lower extremity edema. No gross deformities of extremities. Tenderness and mild redness to the right knee.  Full passive ROM. No effusion.  Neurologic:  Normal speech and language. No gross focal neurologic deficits are appreciated.  Skin:  Skin is warm, dry and intact. No rash noted. Psychiatric: Mood and affect are normal. Speech and behavior are normal.  ____________________________________________   LABS (all labs ordered are listed, but only abnormal results are displayed)  Labs Reviewed  URINALYSIS, ROUTINE W REFLEX MICROSCOPIC (NOT AT [facility]) - Abnormal; Notable for the following:       Result Value   Ketones, ur 15 (*)    Leukocytes, UA SMALL (*)    All other components within normal limits  URINE MICROSCOPIC-ADD ON - Abnormal; Notable for the following:    Squamous Epithelial / LPF 0-5 (*)    Bacteria, UA RARE (*)    All other components within normal limits   ____________________________________________  EKG  Reviewed in MUSE. No STEMI.  ____________________________________________  RADIOLOGY  Dg Knee 2 Views Right  Result Date: 10/23/2015 CLINICAL DATA:  Right knee pain and bruising. EXAM: RIGHT KNEE - 1-2 VIEW COMPARISON:  None. FINDINGS: Prepatellar soft tissue swelling is present at the right knee. There is no underlying fracture. No joint effusion is present. No acute or focal osseous abnormality is evident. IMPRESSION: 1. Prepatellar soft tissue swelling without an underlying fracture. Electronically Signed   By: San Morelle M.D.   On: 10/23/2015 20:04  Ct Head Wo Contrast  Result Date: 10/23/2015 CLINICAL DATA:  Unsteady gait EXAM: CT HEAD WITHOUT CONTRAST TECHNIQUE: Contiguous axial images were obtained from the base of the skull through the vertex without intravenous contrast. COMPARISON:  10/19/2015 FINDINGS: Atrophic changes and chronic white matter ischemic change is again identified similar to that seen on the recent exam. No acute hemorrhage or acute infarct is noted. No space-occupying mass lesion is seen. Small lacunar infarct is noted in the deep white  matter adjacent to the right lateral ventricle stable from the prior study. There again noted changes at C1 to articulation similar to that noted on the prior exam with some rows sh changes consistent with arthropathy. IMPRESSION: Chronic atrophic and ischemic changes without acute abnormality. Inflammatory changes at the C1-2 articulation. These are also stable from the prior study. Electronically Signed   By: Inez Catalina M.D.   On: 10/23/2015 19:49   ____________________________________________   PROCEDURES  Procedure(s) performed:   Procedures  None ____________________________________________   INITIAL IMPRESSION / ASSESSMENT AND PLAN / ED COURSE  Pertinent labs & imaging results that were available during my care of the patient were reviewed by me and considered in my medical decision making (see chart for details).  Patient presents  to the emergency department for evaluation of frequent falls and chronic head and neck pain. The patient's daughter is at bedside which improves our understanding of the patient's underlying issue. He has been on hydrocodone for some time and recently started a muscle relaxer. Higher suspicion for partly pharmacy as the cause of his gait abnormality. With additional falls since his CT scan on 7/25 and being on Warfarin I plan to re-scan his head and x-ray the right knee. Will obtain UA and obtain EKG. Patient had labs done in the emergency department yesterday including INR. Will not repeat these. Patient may benefit for home health/PT which I discussed with the patient and daughter in detail.   Patient is ambulatory in the ED. Repeat CT and knee x-ray are negative. No clinical evidence to suggest septic joint. Discussed cutting back on the patient's hydrocodone and muscle relaxer. Will treat patient's UA with few bacteria and positive leuk esterase. Patient has PCP follow up already scheduled for the coming week. I have also placed an order for home health  evaluation and PT evaluation. Discussed the plan with the patient and daughter at bedside who understand the follow up plan and are pleased.  ____________________________________________  FINAL CLINICAL IMPRESSION(S) / ED DIAGNOSES  Final diagnoses:  Multiple falls     MEDICATIONS GIVEN DURING THIS VISIT:  None  NEW OUTPATIENT MEDICATIONS STARTED DURING THIS VISIT:  Discharge Medication List as of 10/23/2015  8:47 PM    START taking these medications   Details  cephALEXin (KEFLEX) 500 MG capsule Take 1 capsule (500 mg total) by mouth 4 (four) times daily., Starting Sat 10/23/2015, Print          Note:  This document was prepared using Dragon voice recognition software and may include unintentional dictation errors.  Nanda Quinton, MD Emergency Medicine   Margette Fast, MD 10/23/15 2155

## 2015-10-23 NOTE — ED Notes (Signed)
Patient returned from X-ray 

## 2015-10-23 NOTE — ED Triage Notes (Signed)
Per [facility], pt c.o pain everywhere with unsteady gait, pt seen here yesterday for the same and discharged. Pt here for the same, no changes. Pt ambulatory with assistance, given a wheelchair at home. Pt in NAD. VSS

## 2015-10-31 IMAGING — CR DG CHEST 2V
2 series · 2 of 2 positions shown · non-contrast
Comparison: 08/07/2014

CLINICAL DATA: Chest and abdominal pain

EXAM:
CHEST  2 VIEW

[chest pa]
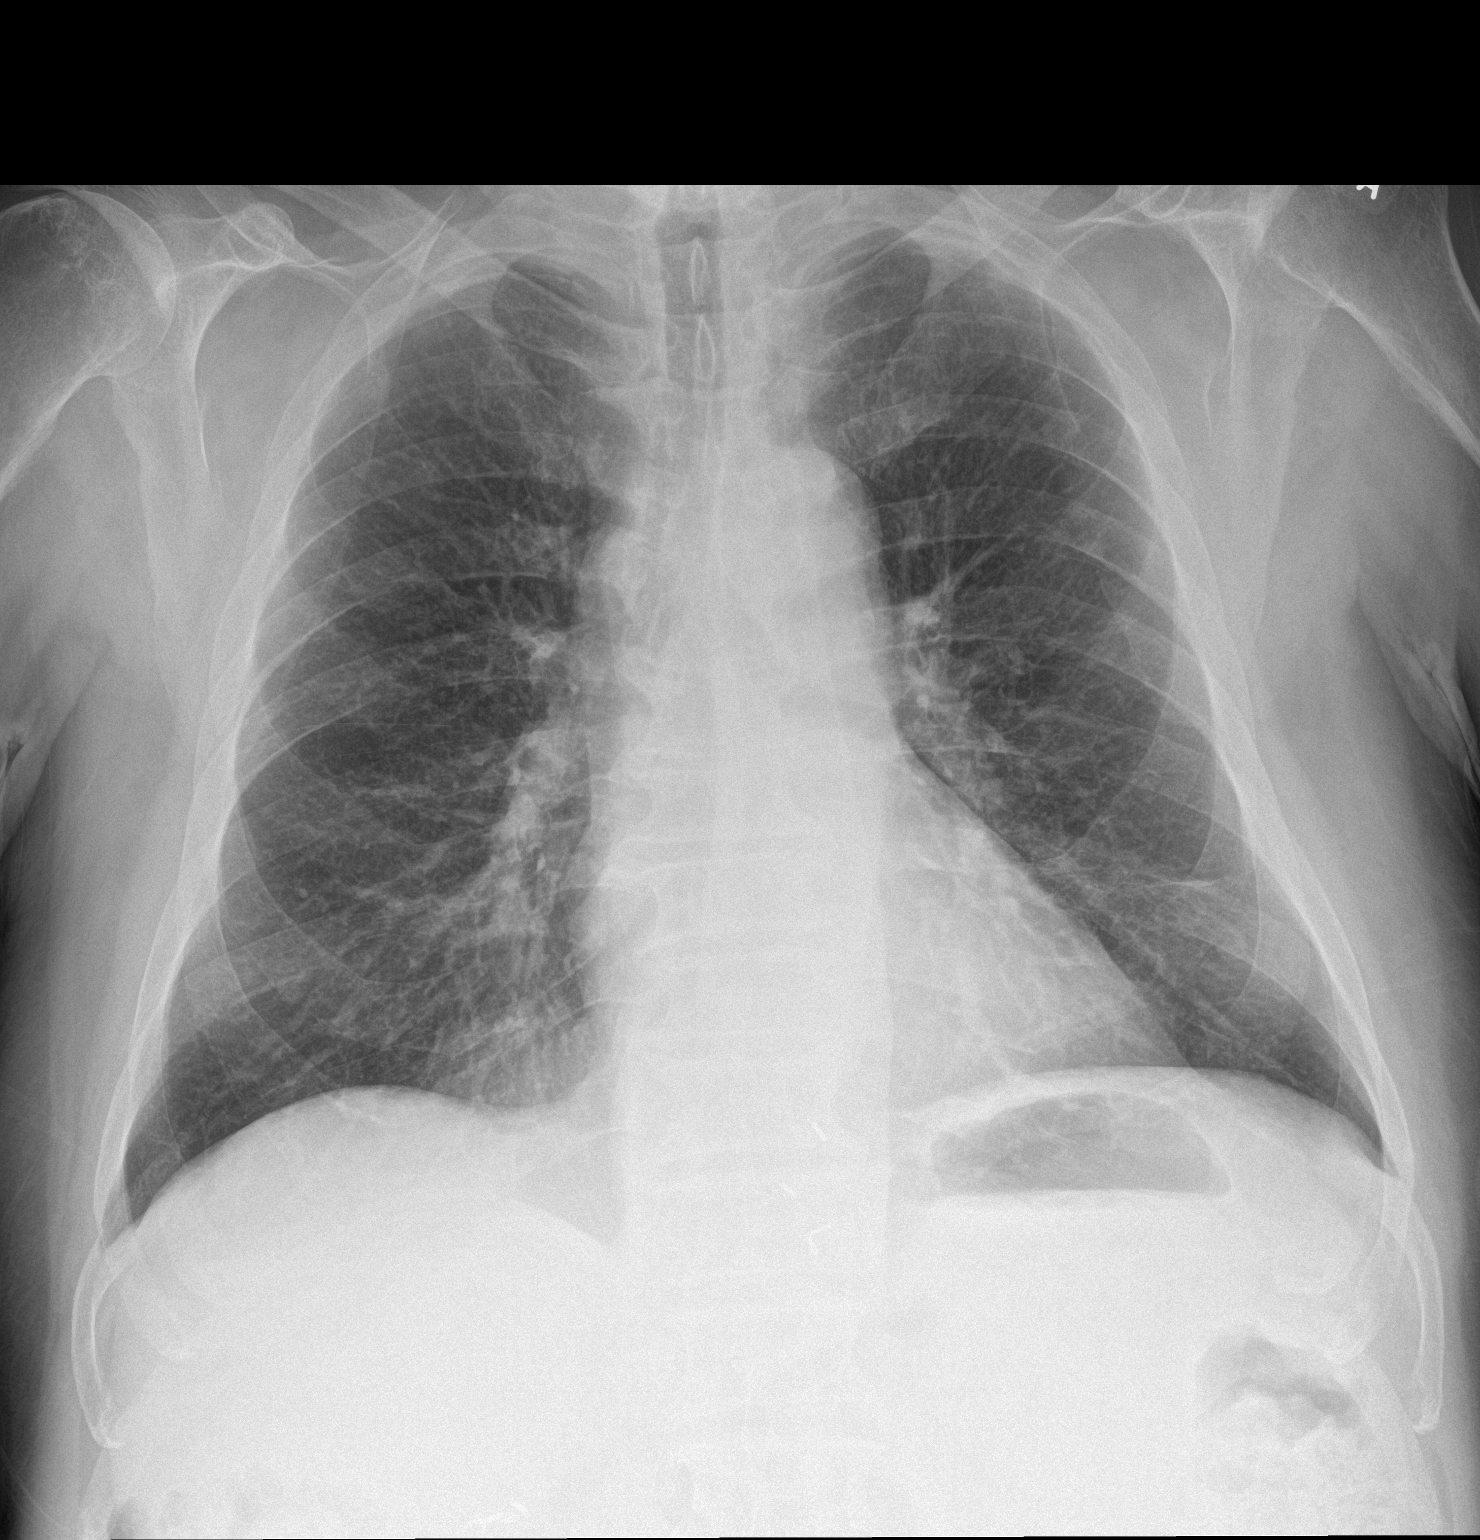

[chest lat]
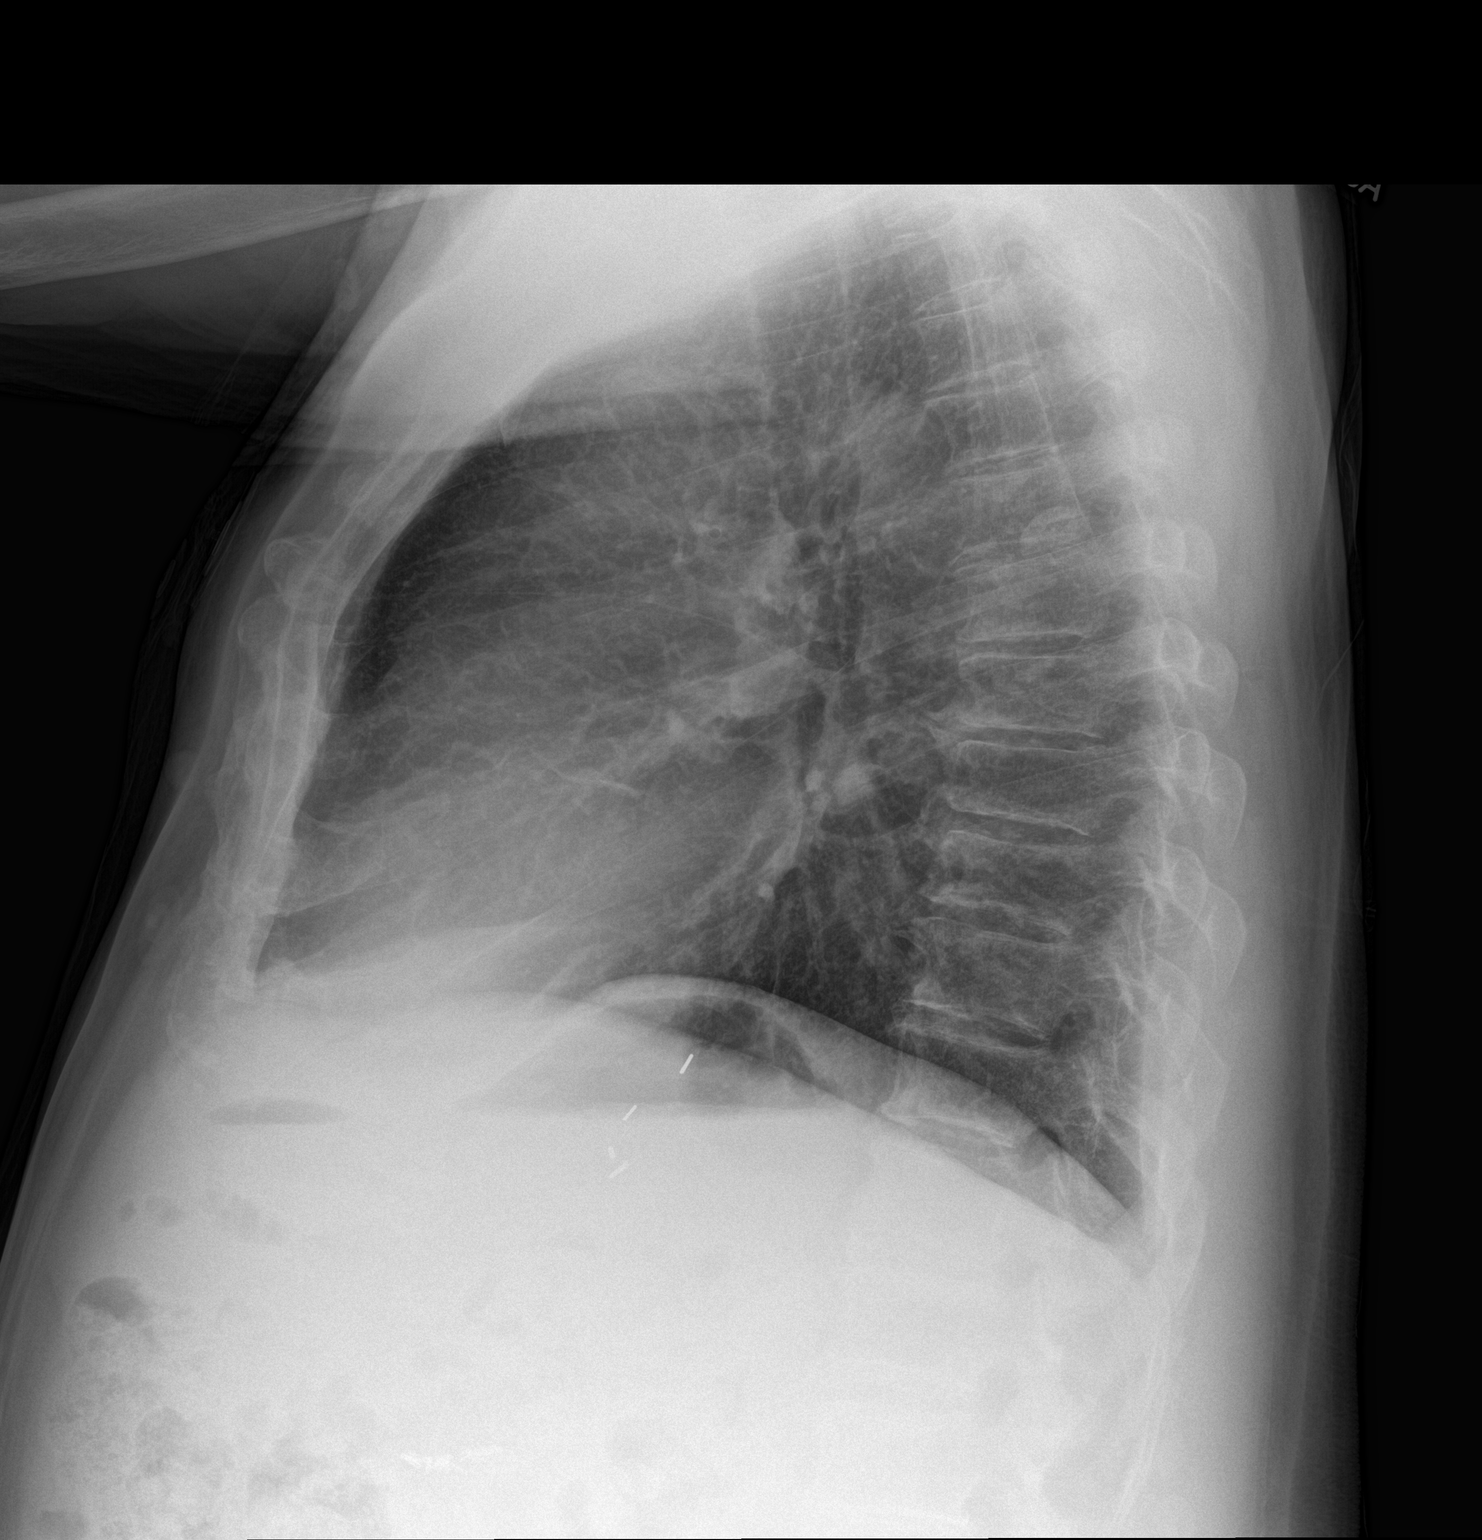

[2 of 2 positions shown; findings below may reference images not displayed]

FINDINGS: Normal heart size and mediastinal contours. Chronic hyperinflation
and interstitial coarsening in this patient with history of COPD. No
acute infiltrate or edema. No effusion or pneumothorax. No acute
osseous findings.
IMPRESSION: No active cardiopulmonary disease.

## 2015-11-01 IMAGING — CT CT ABD-PELV W/ CM
2 of 5 series · 16 of 46 positions shown, 18 images · IV contrast (Omni 300)
Comparison: 08/01/2013

CLINICAL DATA: Epigastric and lower abdominal pain since last
weekend. Initial encounter.

EXAM:
CT ABDOMEN AND PELVIS WITH CONTRAST
TECHNIQUE: Multidetector CT imaging of the abdomen and pelvis was performed
using the standard protocol following bolus administration of
intravenous contrast.
CONTRAST:  25mL OMNIPAQUE IOHEXOL 300 MG/ML SOLN, 100mL OMNIPAQUE
IOHEXOL 300 MG/ML SOLN

[Series 2: abd/ pelvis 5.0 i30f 1 · axial · 0.92mm/px · z∈[-521,-86]mm · 13 of 99 slices shown, 15 images]
[im 6/99  soft-tissue]
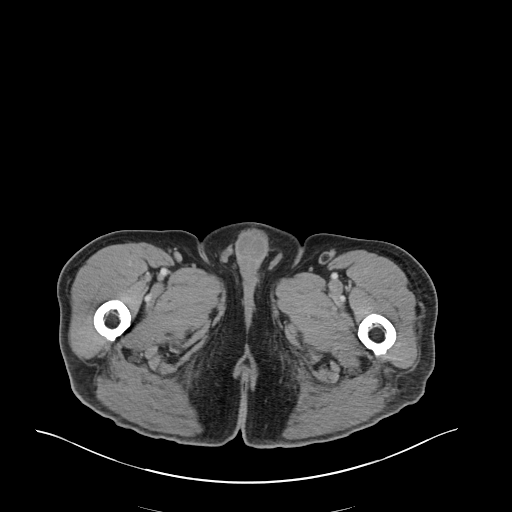
[im 6/99  bone]
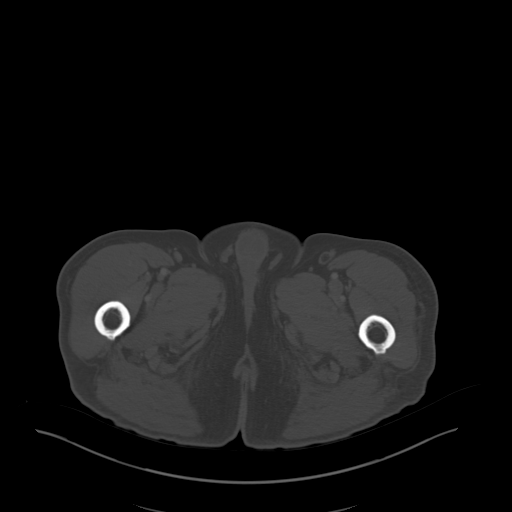
[im 16/99  soft-tissue]
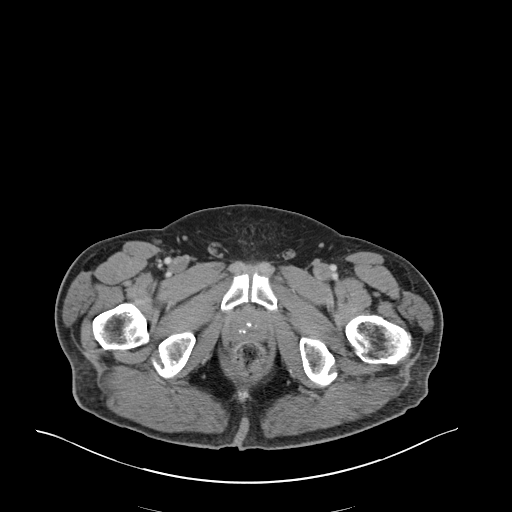
[im 21/99  soft-tissue]
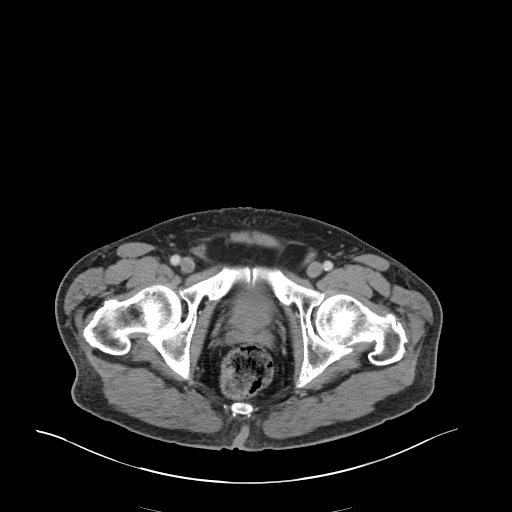
[im 26/99  soft-tissue]
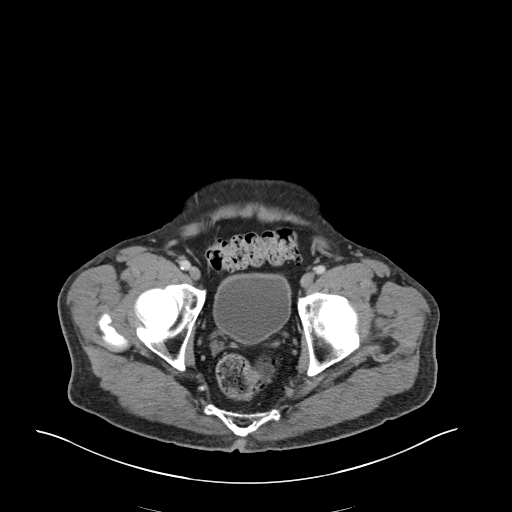
[im 37/99  soft-tissue]
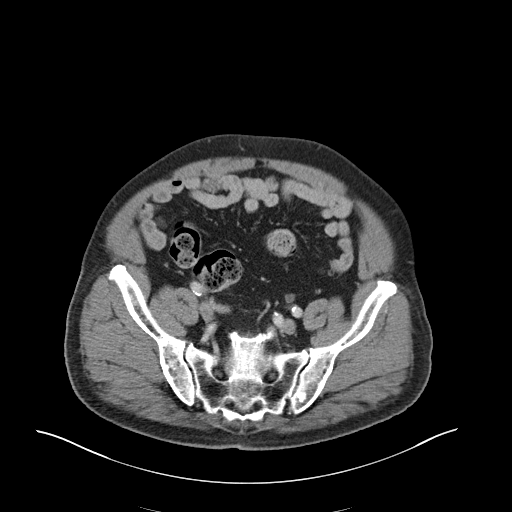
[im 42/99  soft-tissue]
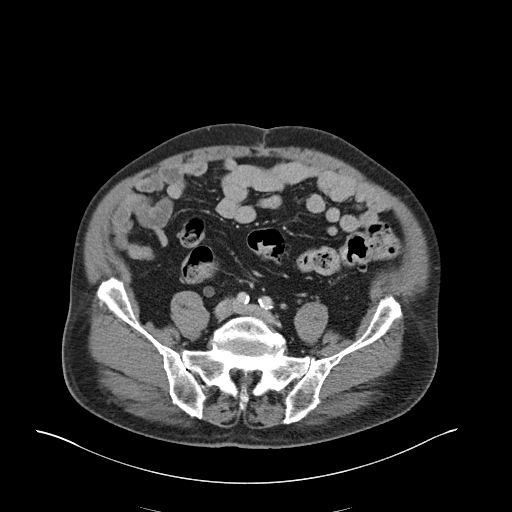
[im 52/99  soft-tissue]
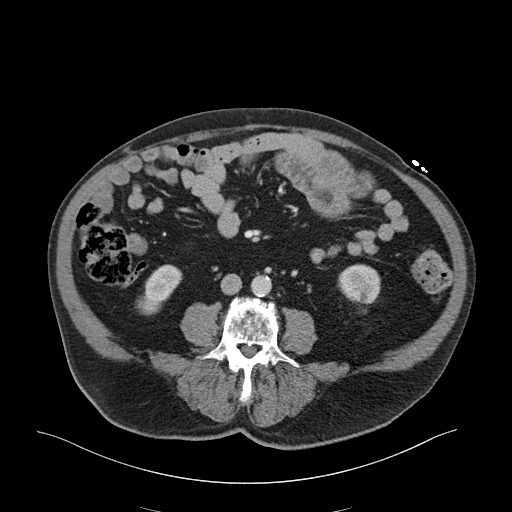
[im 57/99  soft-tissue]
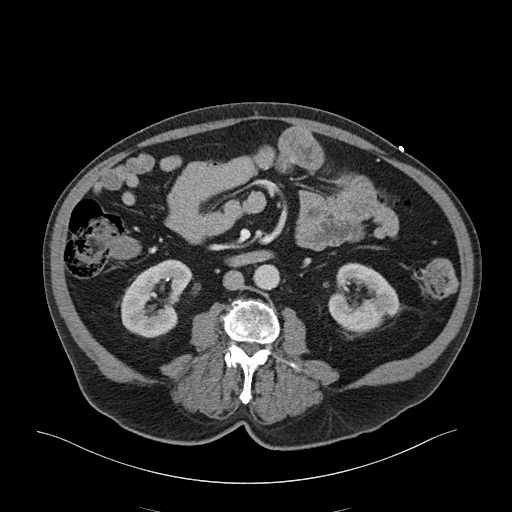
[im 62/99  soft-tissue]
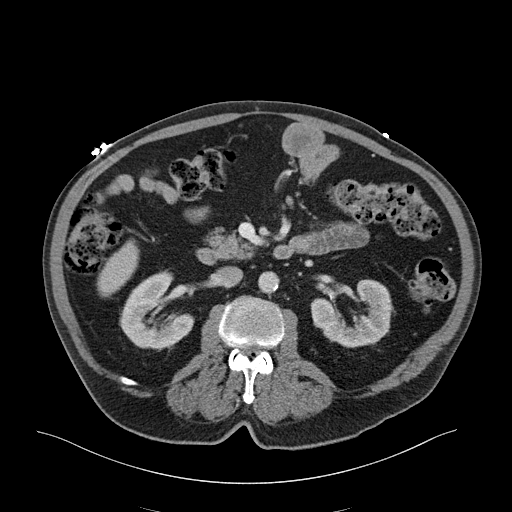
[im 62/99  bone]
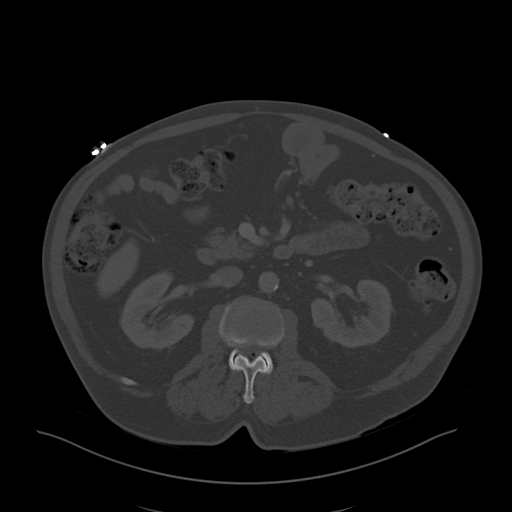
[im 73/99  soft-tissue]
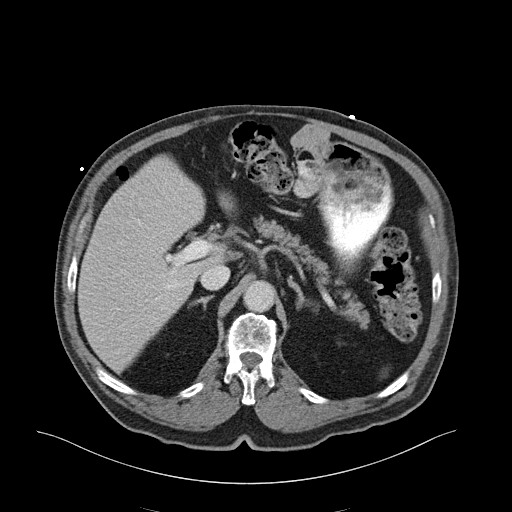
[im 78/99  soft-tissue]
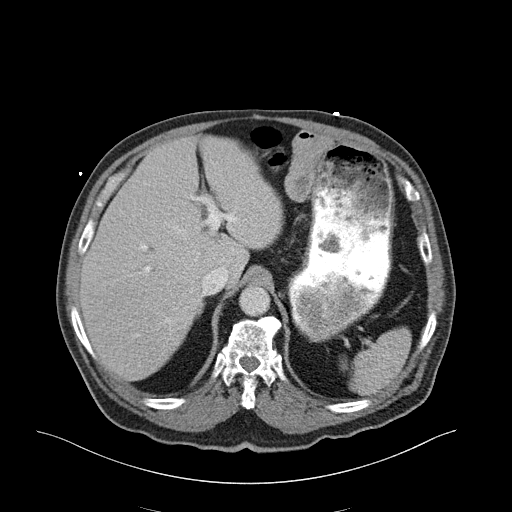
[im 83/99  soft-tissue]
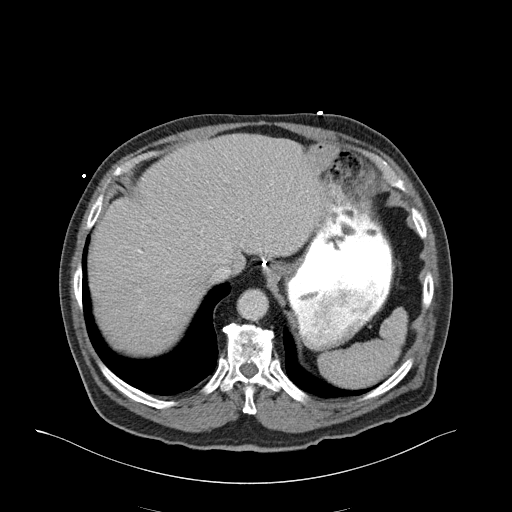
[im 93/99  soft-tissue]
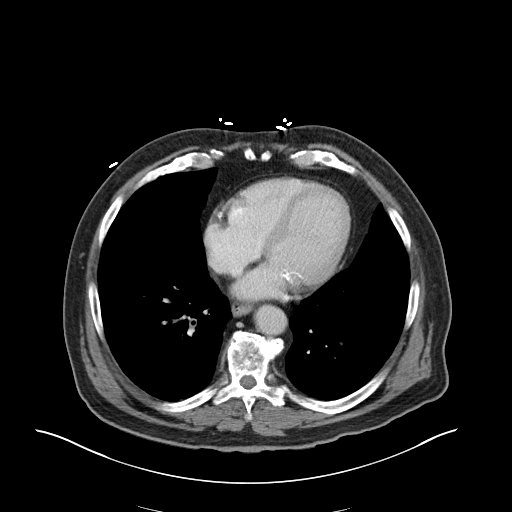

[Series 5: coronals · coronal · 0.87mm/px · 3 of 156 slices shown]
[im 52/156  soft-tissue]
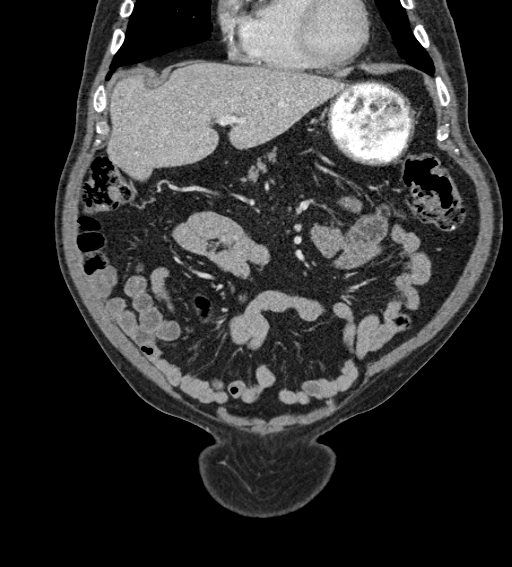
[im 69/156  soft-tissue]
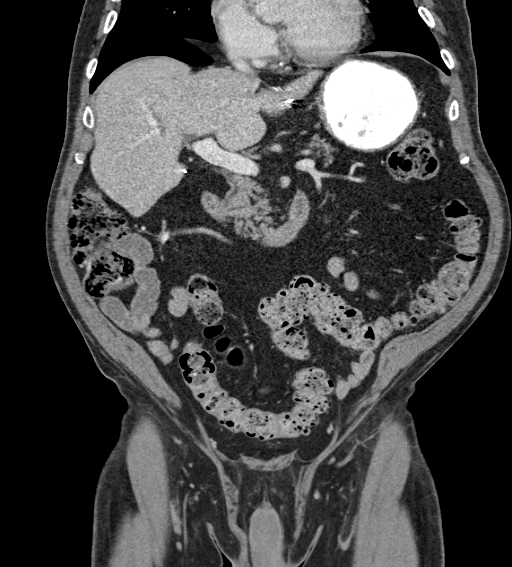
[im 87/156  soft-tissue]
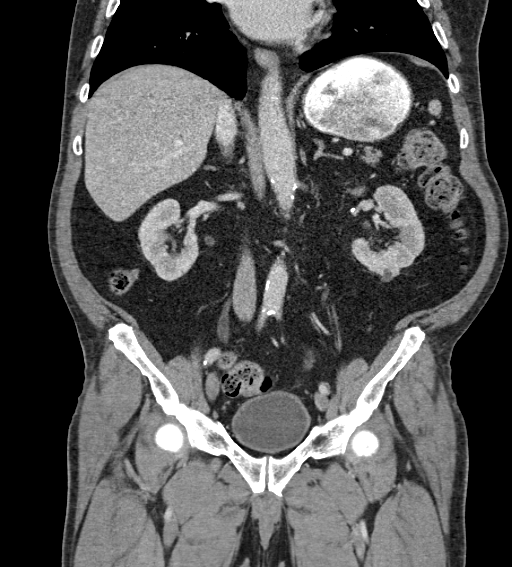

[16 of 46 positions shown; findings below may reference images not displayed]

FINDINGS: BODY WALL: No contributory findings.

LOWER CHEST: Mild bronchial wall thickening in the right lower lobe.
Calcified granuloma in the right lower lobe. Diffuse
atherosclerosis. No acute findings.

ABDOMEN/PELVIS:

Liver: No focal abnormality.

Biliary: Mild intrahepatic biliary prominence after cholecystectomy.
No biliary obstruction is suspected.

Pancreas: Unremarkable.

Spleen: Unremarkable.

Adrenals: Unremarkable.

Kidneys and ureters: Nonobstructive stones in the lower pole right
kidney, up to 7 mm. Left renal hilar calcification is arterial.

Bladder: Unremarkable.

Reproductive: Unremarkable for age.

Bowel: No obstruction. Normal appendix. Distal colonic
diverticulosis which is mild. Distal gastrectomy. No inflammatory
changes at the gastroenteric anastomosis. Surgical changes at the GE
junction.

Retroperitoneum: No mass or adenopathy.

Peritoneum: No ascites or pneumoperitoneum.

Vascular: No acute abnormality.

OSSEOUS: Diffuse spondylotic endplate spurring and facet
arthropathy. At L2-3 there is a posterior gas containing cystic
structure, likely left facet synovial cyst.
IMPRESSION: 1. No acute findings.
2. Right nephrolithiasis and other incidental findings are stable
from prior noted above.

## 2015-11-07 IMAGING — CR DG ABDOMEN ACUTE W/ 1V CHEST
3 series · 3 of 3 positions shown · non-contrast
Comparison: Chest radiograph - 08/20/2014 ; 05/24/2014; CT abdomen
pelvis- 08/21/2014

CLINICAL DATA: Constipation for 1 week. Chest pain. History of
bowel resection.

EXAM:
DG ABDOMEN ACUTE W/ 1V CHEST

[chest pa]
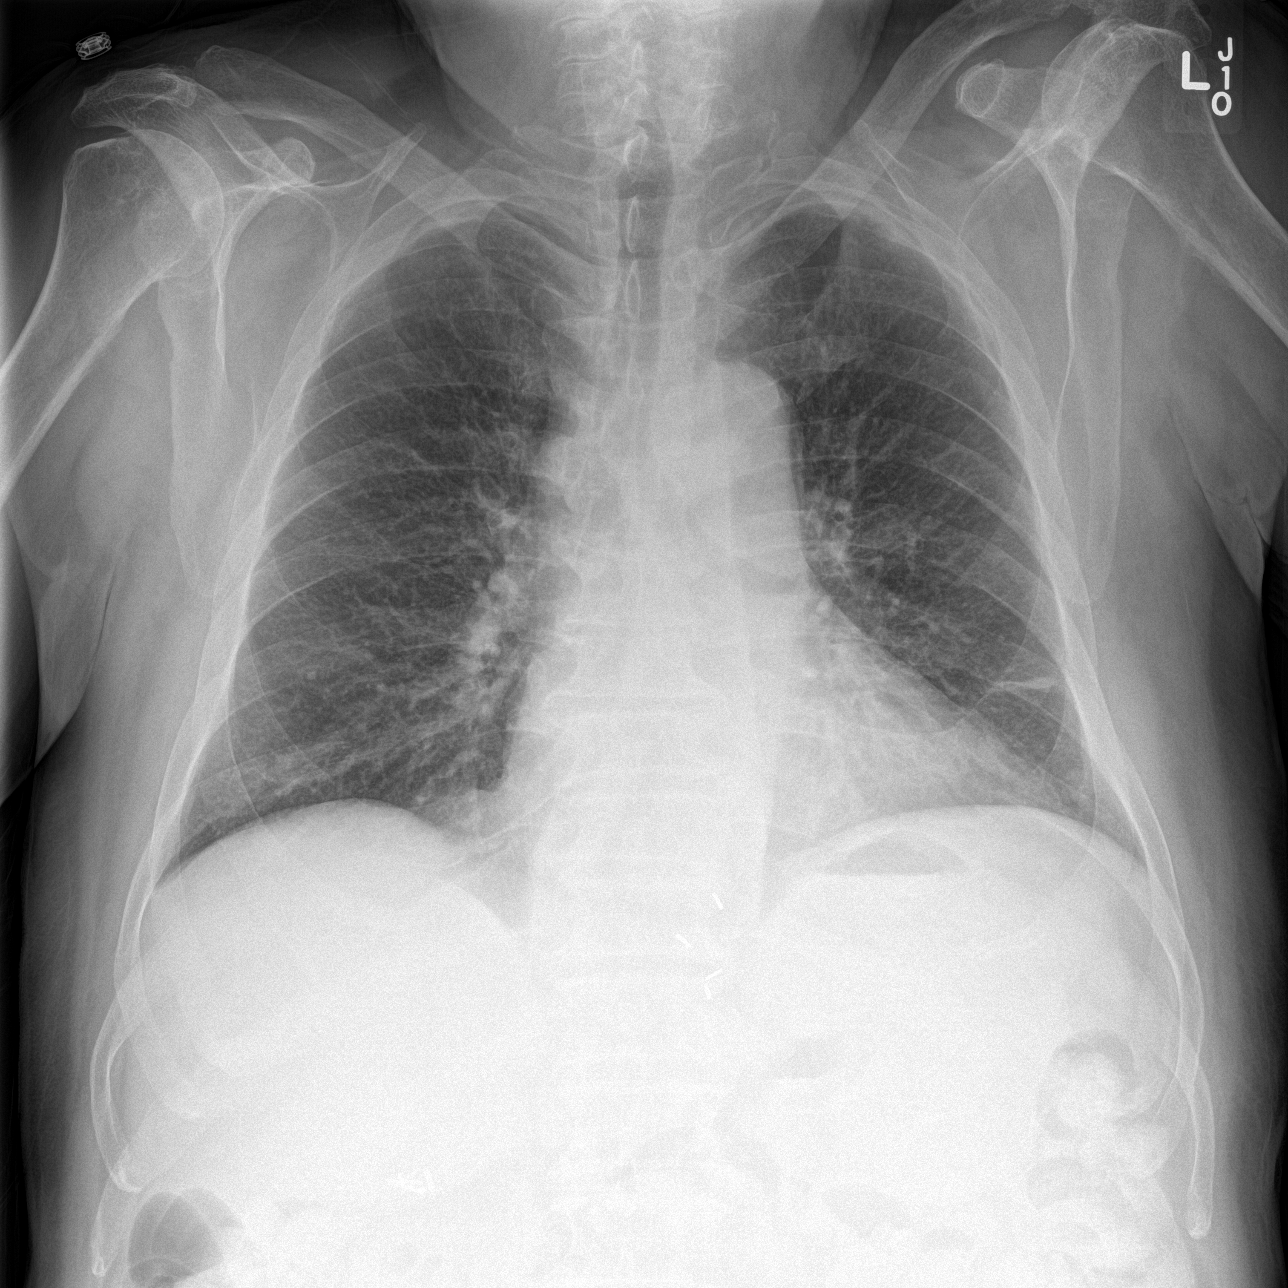

[abdomen erect]
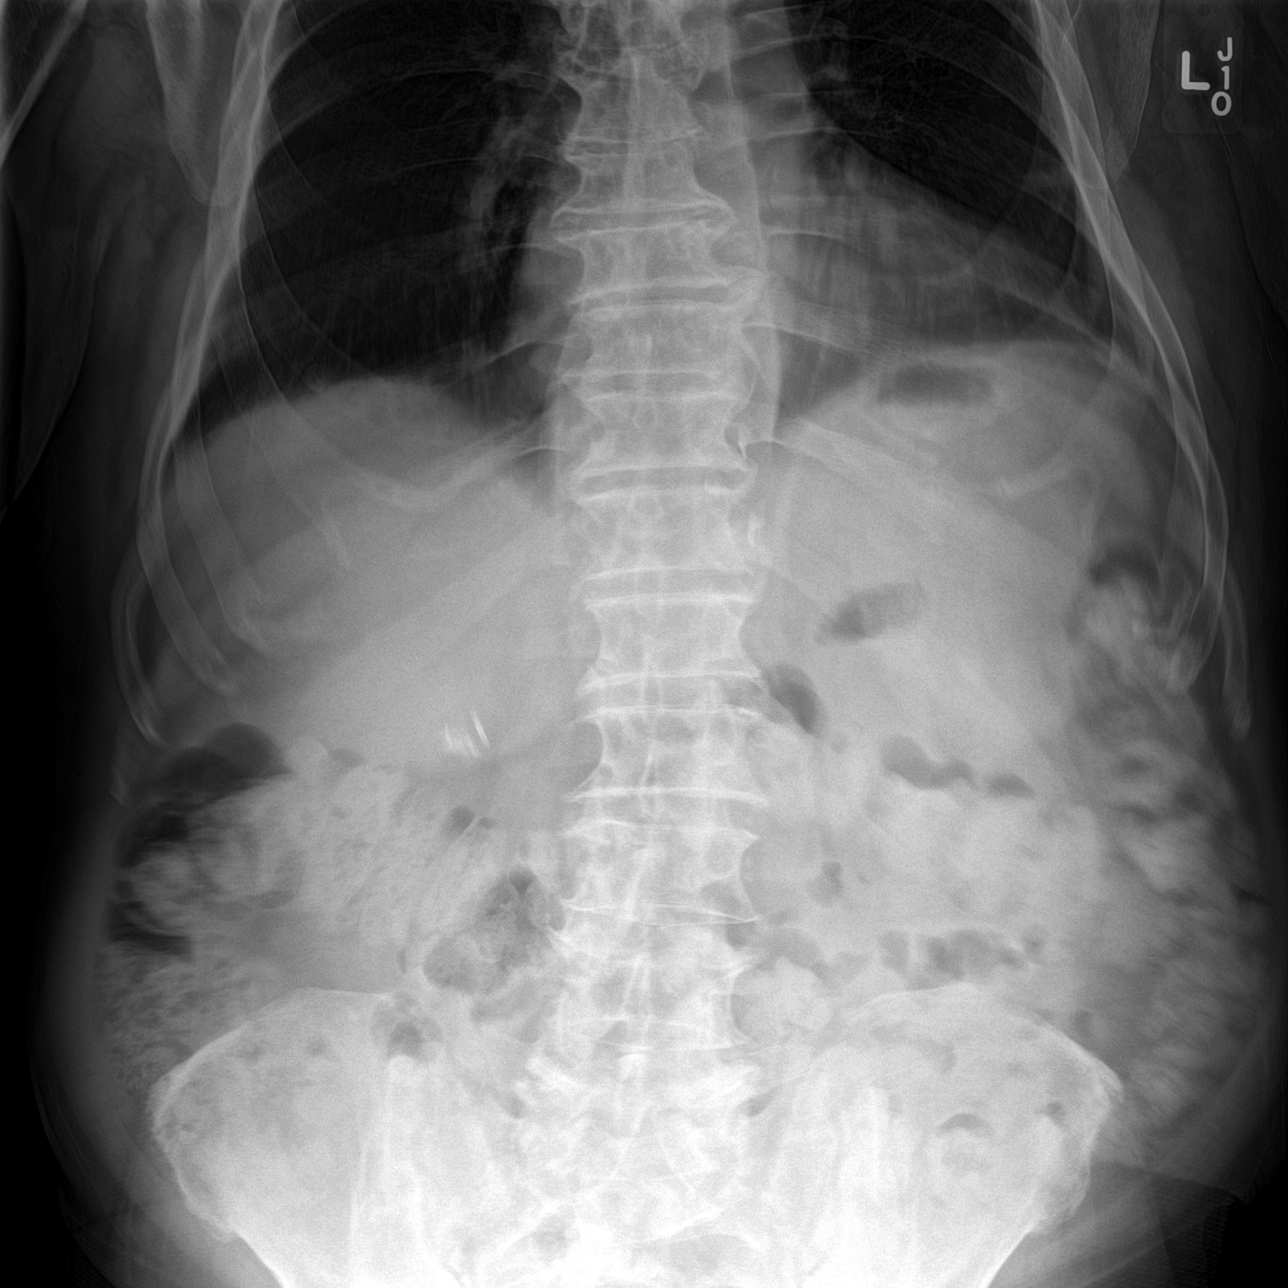

[abdomen supine]
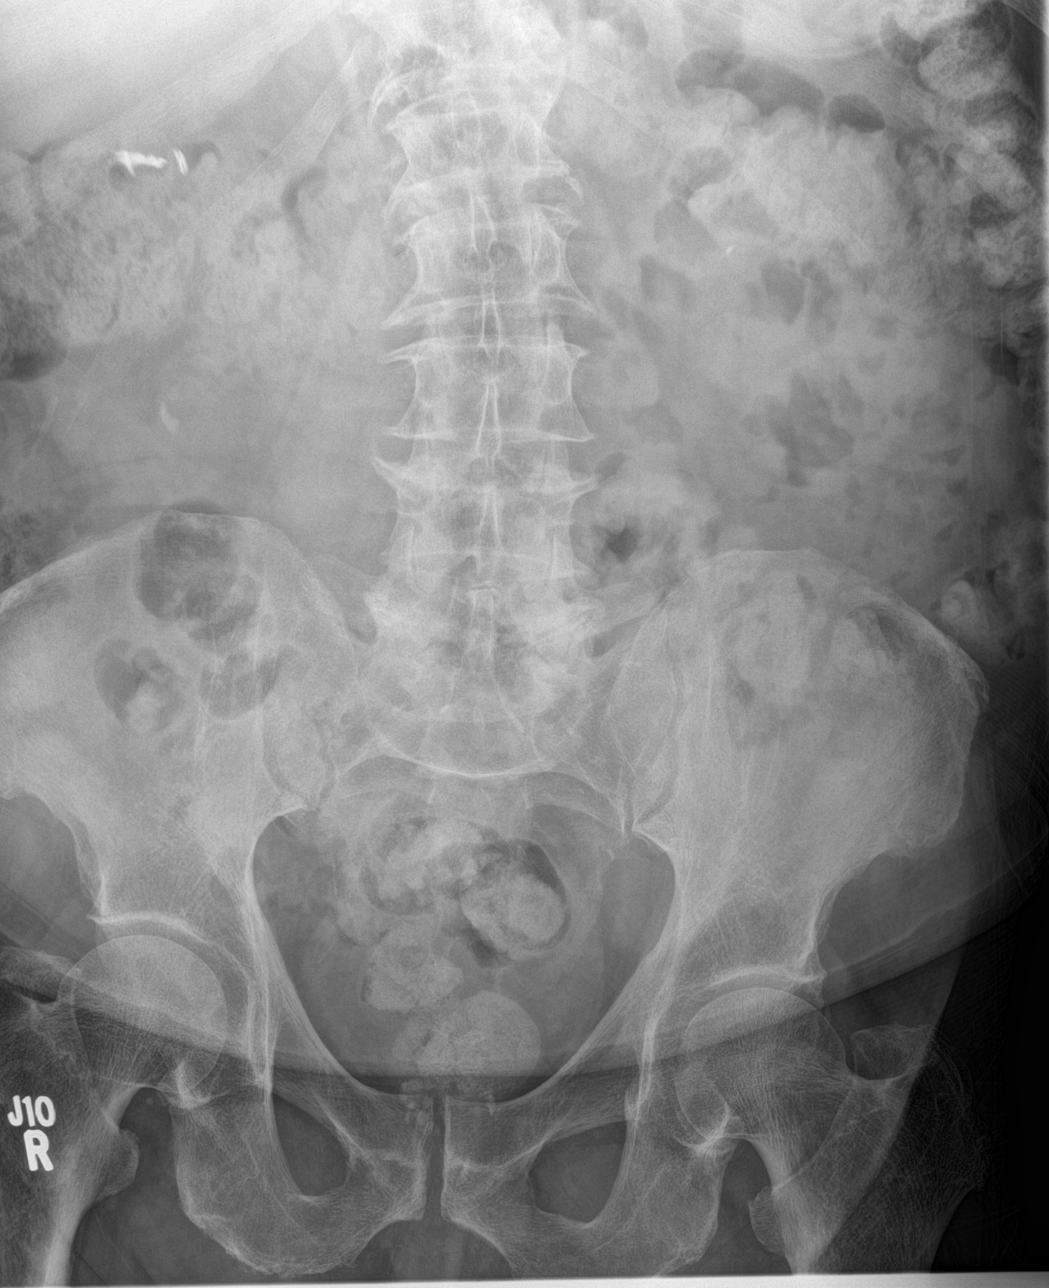

[3 of 3 positions shown; findings below may reference images not displayed]

FINDINGS: Grossly unchanged cardiac silhouette and mediastinal contours given
reduced lung volumes. Grossly unchanged bibasilar heterogeneous
opacities, left greater than right, likely atelectasis. No new focal
airspace opacities. No pleural effusion or pneumothorax. No definite
evidence of edema.

Large colonic stool burden without evidence of enteric obstruction.
No pneumoperitoneum, pneumatosis or portal venous gas.

Post cholecystectomy. Additional surgical clips overlie the expected
location of the gastroesophageal junction.

Punctate (approximately 0.9 cm) opacity overlying expected location
of the inferior pole of the right kidney likely correlates with the
nonobstructing stones seen on recently acquired abdominal CT.
Additional punctate opacity overlying the left upper abdominal
quadrant is favored to represent ingested radiopaque debris.

No acute osseus abnormalities.
IMPRESSION: 1. Bibasilar atelectasis without acute cardiopulmonary disease.
2. Large colonic stool burden without evidence of enteric
obstruction.
3. Suspected right-sided nephrolithiasis as demonstrated on recently
obtained abdominal CT.

## 2015-11-19 ENCOUNTER — Ambulatory Visit (INDEPENDENT_AMBULATORY_CARE_PROVIDER_SITE_OTHER): Payer: Medicare Other | Admitting: Cardiology

## 2015-11-19 ENCOUNTER — Encounter (INDEPENDENT_AMBULATORY_CARE_PROVIDER_SITE_OTHER): Payer: Self-pay

## 2015-11-19 ENCOUNTER — Ambulatory Visit (INDEPENDENT_AMBULATORY_CARE_PROVIDER_SITE_OTHER): Payer: Medicare Other | Admitting: Pharmacist Clinician (PhC)/ Clinical Pharmacy Specialist

## 2015-11-19 ENCOUNTER — Encounter: Payer: Self-pay | Admitting: Cardiology

## 2015-11-19 VITALS — BP 120/62 | HR 78 | Ht 66.0 in | Wt 182.8 lb

## 2015-11-19 DIAGNOSIS — I480 Paroxysmal atrial fibrillation: Secondary | ICD-10-CM

## 2015-11-19 DIAGNOSIS — I10 Essential (primary) hypertension: Secondary | ICD-10-CM | POA: Diagnosis not present

## 2015-11-19 DIAGNOSIS — Z7901 Long term (current) use of anticoagulants: Secondary | ICD-10-CM | POA: Diagnosis not present

## 2015-11-19 DIAGNOSIS — I2510 Atherosclerotic heart disease of native coronary artery without angina pectoris: Secondary | ICD-10-CM | POA: Diagnosis not present

## 2015-11-19 DIAGNOSIS — E669 Obesity, unspecified: Secondary | ICD-10-CM | POA: Diagnosis not present

## 2015-11-19 DIAGNOSIS — E785 Hyperlipidemia, unspecified: Secondary | ICD-10-CM | POA: Diagnosis not present

## 2015-11-19 DIAGNOSIS — E1100 Type 2 diabetes mellitus with hyperosmolarity without nonketotic hyperglycemic-hyperosmolar coma (NKHHC): Secondary | ICD-10-CM

## 2015-11-19 DIAGNOSIS — I5032 Chronic diastolic (congestive) heart failure: Secondary | ICD-10-CM

## 2015-11-19 DIAGNOSIS — Z5181 Encounter for therapeutic drug level monitoring: Secondary | ICD-10-CM

## 2015-11-19 LAB — POCT INR: INR: 2.5

## 2015-11-19 MED ORDER — NITROGLYCERIN 0.4 MG SL SUBL: 0.4000 mg | SUBLINGUAL_TABLET | SUBLINGUAL | 9 refills | Status: DC | PRN

## 2015-11-19 NOTE — Assessment & Plan Note (Signed)
Recent falls attributed to medication.

## 2015-11-19 NOTE — Assessment & Plan Note (Signed)
No CHF on exam or by history

## 2015-11-19 NOTE — Assessment & Plan Note (Signed)
On statin Rx 

## 2015-11-19 NOTE — Assessment & Plan Note (Signed)
On Insulin, PCP follows

## 2015-11-19 NOTE — Assessment & Plan Note (Signed)
Controlled.  

## 2015-11-19 NOTE — Assessment & Plan Note (Signed)
Holding NSR with 1st degree AVB. No change in his medications. We have had to back off beta blocker in the past but he is stable on current dose.

## 2015-11-19 NOTE — Patient Instructions (Addendum)
No changes have been made at this visit. Your physician has recommended that you continue your current therapy.   Follow-Up  Your physician wants you to follow-up in: 6 months with Dr. [name]. You will receive a reminder letter in the mail two months in advance. If you don't receive a letter, please call our office to schedule the follow-up appointment.  If you need a refill on your cardiac medications before your next appointment, please call your pharmacy.

## 2015-11-19 NOTE — Progress Notes (Signed)
11/19/2015 [name]   1940-07-14  [id number]  Primary Physician [name], MD Primary Cardiologist: Dr [name]  HPI:  75 y.o.Caucasian male originally from [name] who moved to [place] to be closer to his daughter. He has a history of mildCAD (20% LAD) by cath in 2012, low risk Myoview in April 2015, s/p flutter ablation May 2012 followed by DCCV Sept 2012 for recurrent PAF. He has maintained NSR on BB, CCB & flecainide.  He is on chronic Coumadin (CHADs VASc=5), INR followed in our office. Other problems include HTN, HLD, COPD, IDDM, and severe C-spine DJD. He is in the office today with his daughter for a 6 month check.         He has had multiple ED visits since we last saw him. He was seen 09/01/15 for rib pain. He was seen 7/23, 7/25, and 7/28 for headaches and neck pain. He was placed on a "muscle relaxer" and came back to the ED 7/29 after several falls at home.  It turns out the muscle relaxer was causing him to loose his balance. He has stopped this and has since done well, no more falls. His EKG today shows NSR with a long first degree AVB.    Current Outpatient Prescriptions  Medication Sig Dispense Refill  . acetaminophen (TYLENOL) 650 MG CR tablet Take 650 mg by mouth 2 (two) times daily as needed for pain.    [name] aspirin EC 81 MG tablet Take 81 mg by mouth daily.    [name] diltiazem (CARDIZEM CD) 120 MG 24 hr capsule Take 1 capsule (120 mg total) by mouth daily. 90 capsule 3  . ferrous sulfate 325 (65 FE) MG tablet Take 325 mg by mouth daily with breakfast.    . flecainide (TAMBOCOR) 150 MG tablet Take 225 mg by mouth 2 (two) times daily. Take one and one half tabs twice daily    . HYDROcodone-acetaminophen (NORCO/VICODIN) 5-325 MG tablet Take 0.5 tablets by mouth every 4 (four) hours as needed for severe pain. 20 tablet 0  . hydrocortisone cream 1 % Apply 1 application topically 2 (two) times daily as needed for itching.     . Insulin Glargine (LANTUS) 100 UNIT/ML  Solostar Pen Inject 2 Units into the skin 2 (two) times daily.     [name] loratadine (CLARITIN) 10 MG tablet Take 10 mg by mouth every evening.     . metFORMIN (GLUCOPHAGE) 1000 MG tablet Take 1,000 mg by mouth 2 (two) times daily with a meal.     . metoprolol succinate (TOPROL-XL) 25 MG 24 hr tablet Take 0.5 tablets (12.5 mg total) by mouth every evening. 45 tablet 3  . Multiple Vitamin (MULTIVITAMIN WITH MINERALS) TABS tablet Take 1 tablet by mouth every evening.     . nitroGLYCERIN (NITROSTAT) 0.4 MG SL tablet Place 1 tablet (0.4 mg total) under the tongue every 5 (five) minutes x 3 doses as needed for chest pain. 25 tablet 9  . pravastatin (PRAVACHOL) 40 MG tablet Take 40 mg by mouth daily.    . tamsulosin (FLOMAX) 0.4 MG CAPS capsule Take 0.4 mg by mouth every morning.    . warfarin (COUMADIN) 5 MG tablet TAKE 1 TO 1 AND 1/2 TABLETS DAILY AS DIRECTED (Patient taking differently: TAKE 1 TO 1 AND 1/2 TABLETS DAILY AS DIRECTED every evening, 5 mg Mon, Wed, and Fri.  7.5 mg all other days) 135 tablet 1   No current facility-administered medications for this visit.  No Known Allergies  Social History   Social History  . Marital status: Widowed    Spouse name: [name]  . Number of children: [name]  . Years of education: [name]   Occupational History  . Not on file.   Social History Main Topics  . Smoking status: Current Every Day Smoker    Packs/day: 0.50    Years: 55.00    Types: Cigarettes  . Smokeless tobacco: Never Used     Comment: He's been smoking between 0.5 and 2 ppd since age 61.  [name] Alcohol use No     Comment: "quit drinking in 1986"  . Drug use: No  . Sexual activity: No   Other Topics Concern  . Not on file   Social History Narrative   He is a widower, who recently moved to [name] back in 2011 to live closer to his daughter. He formerly lived in [name]. He is a father 47, grandfather, and great-grandfather of 68. Unfortunately as I mentioned he's gone back to  smoking. Smoking about a half pack a day now. He is not very active, but does try to get outside and walk some. He does not drink alcohol.     Review of Systems: General: negative for chills, fever, night sweats or weight changes.  Cardiovascular: negative for chest pain, dyspnea on exertion, edema, orthopnea, palpitations, paroxysmal nocturnal dyspnea or shortness of breath Dermatological: negative for rash Respiratory: negative for cough or wheezing Urologic: negative for hematuria Abdominal: negative for nausea, vomiting, diarrhea, bright red blood per rectum, melena, or hematemesis Neurologic: negative for visual changes, syncope, or dizziness All other systems reviewed and are otherwise negative except as noted above.    Blood pressure 120/62, pulse 78, height 5\' 6"  (1.676 m), weight 182 lb 12.8 oz (82.9 kg).  General appearance: alert, cooperative and no distress Neck: no JVD and he has very limited ROM Lungs: few rhonchi Heart: regular rate and rhythm Extremities: extremities normal, atraumatic, no cyanosis or edema Skin: Skin color, texture, turgor normal. No rashes or lesions Neurologic: Grossly normal  EKG NSR, PR 320, QTc 474  ASSESSMENT AND PLAN:   PAF (paroxysmal atrial fibrillation), maintaining SR Holding NSR with 1st degree AVB. No change in his medications. We have had to back off beta blocker in the past but he is stable on current dose.   Hyperlipidemia On statin Rx  Essential hypertension Controlled  Chronic diastolic congestive heart failure (HCC) No CHF on exam or by history  Anticoagulation goal of INR 2 to 3, for PAF  Recent falls attributed to medication.   Type 2 diabetes mellitus (HCC) On Insulin, PCP follows   PLAN  No change in his medications. If he has recurrent falls we may have to re evaluate Coumadin risk.   Kerin Ransom PA-C 11/19/2015 9:24 AM

## 2015-11-25 IMAGING — DX DG CHEST 2V
2 series · 2 of 2 positions shown · non-contrast
Comparison: Chest x-ray from acute abdomen series of 08/27/2014.

CLINICAL DATA: Initial encounter for shortness of breath with
productive cough.

EXAM:
CHEST  2 VIEW

[chest pa]
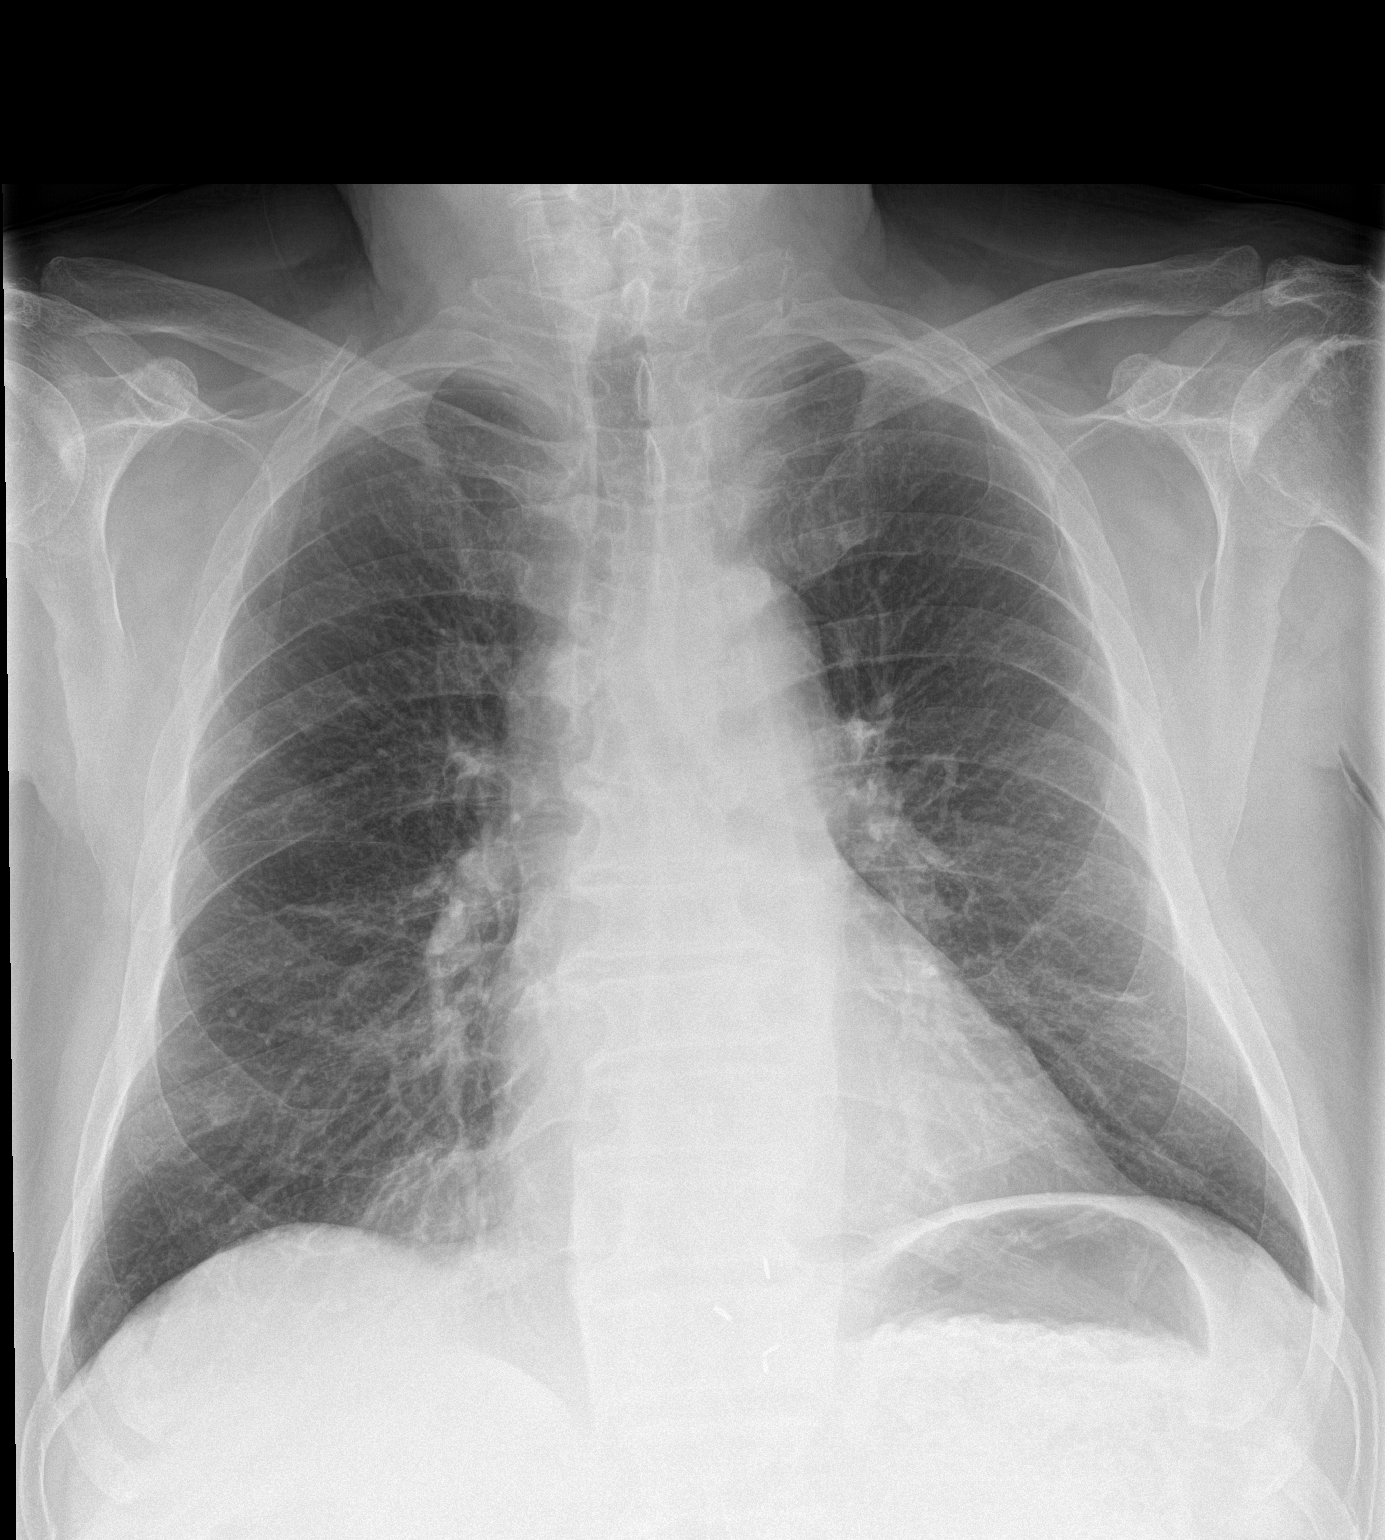

[chest lat]
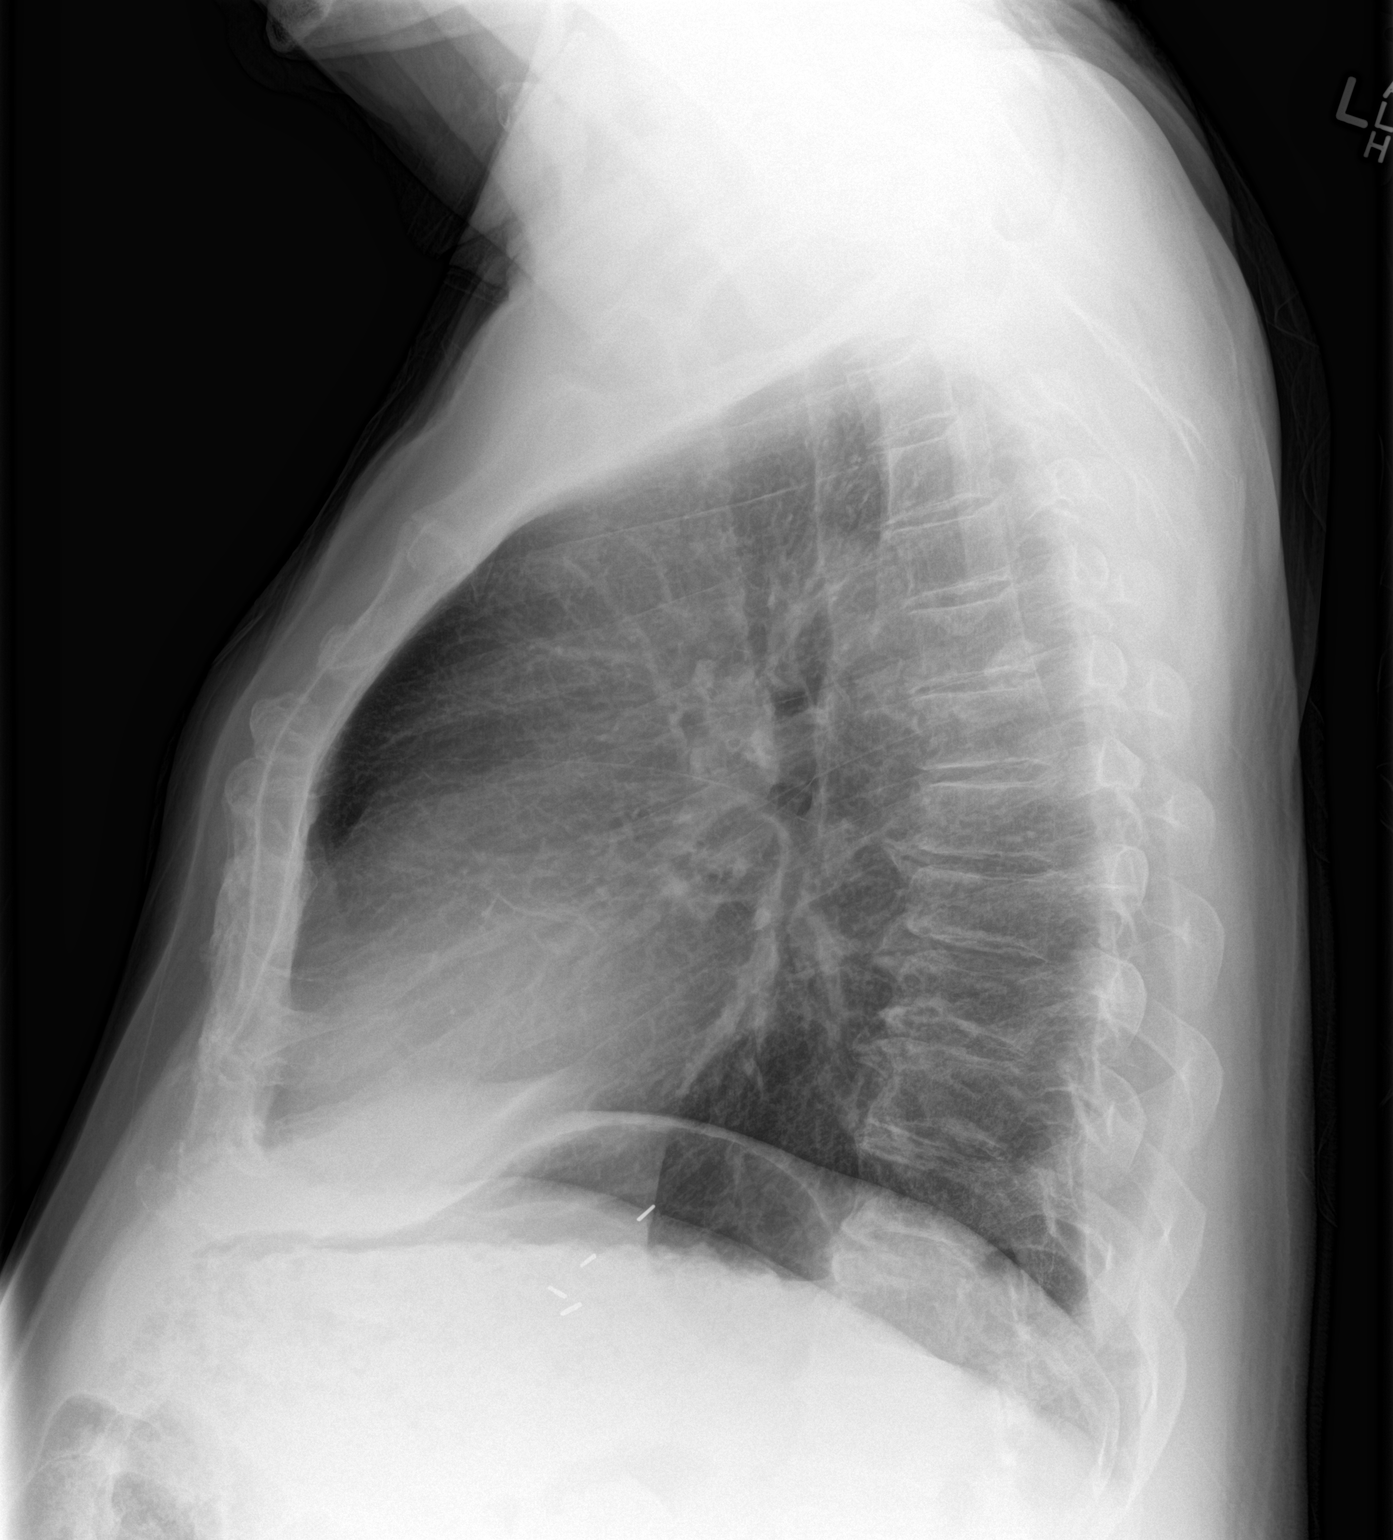

[2 of 2 positions shown; findings below may reference images not displayed]

FINDINGS: Lungs are mildly hyperexpanded. Interstitial markings are diffusely
coarsened with chronic features. No edema or focal airspace
consolidation. Cardiopericardial silhouette is at upper limits of
normal for size. Opacity in the right cardiophrenic angle is
secondary to a fat pad, as confirmed by CT scan of 08/21/2014.
Imaged bony structures of the thorax are intact.
IMPRESSION: Stable.  No acute cardiopulmonary findings.

## 2015-11-29 IMAGING — CT CT ABD-PELV W/ CM
2 of 5 series · 16 of 46 positions shown, 18 images · IV contrast (omnipaque)
Comparison: CT of the abdomen and pelvis from 08/21/2014

CLINICAL DATA: Severe left lower quadrant abdominal pain. Initial
encounter.

EXAM:
CT ABDOMEN AND PELVIS WITH CONTRAST
TECHNIQUE: Multidetector CT imaging of the abdomen and pelvis was performed
using the standard protocol following bolus administration of
intravenous contrast.
CONTRAST:  100mL OMNIPAQUE IOHEXOL 300 MG/ML  SOLN

[Series 2: abd/ pelvis 5.0 i30f 1 · axial · 0.87mm/px · z∈[-432,-12]mm · 13 of 96 slices shown, 15 images]
[im 6/96  soft-tissue]
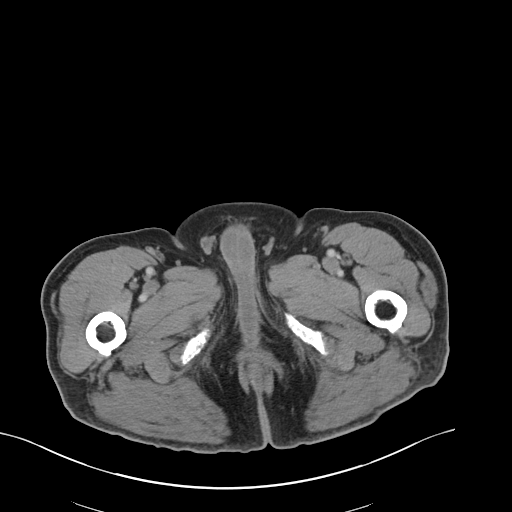
[im 6/96  bone]
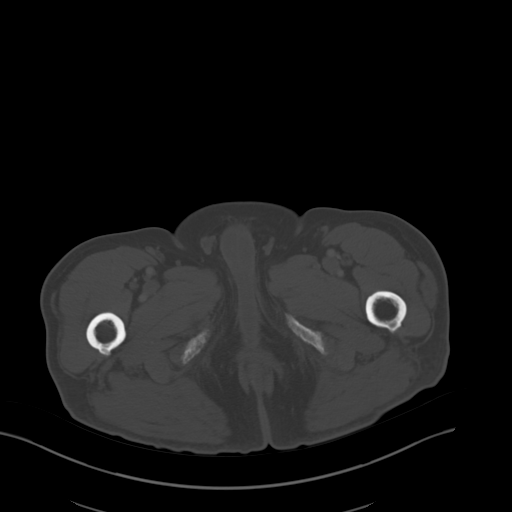
[im 11/96  soft-tissue]
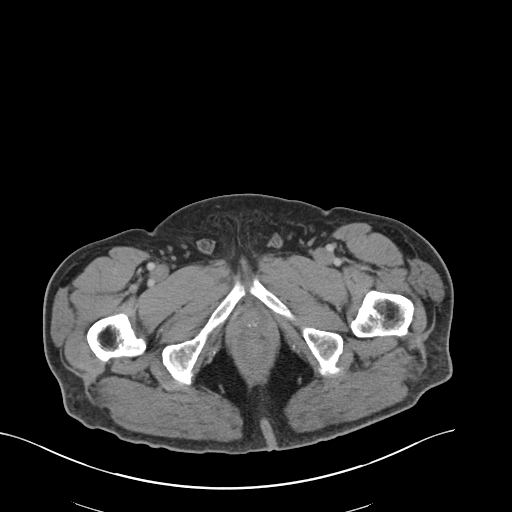
[im 22/96  soft-tissue]
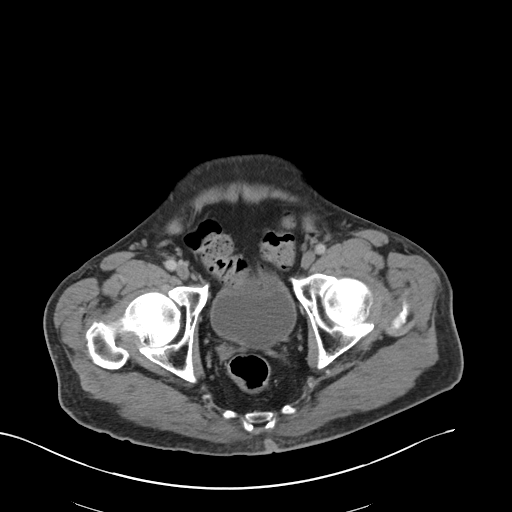
[im 27/96  soft-tissue]
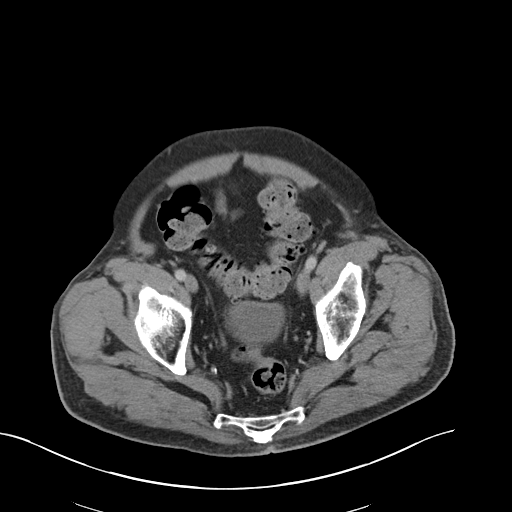
[im 32/96  soft-tissue]
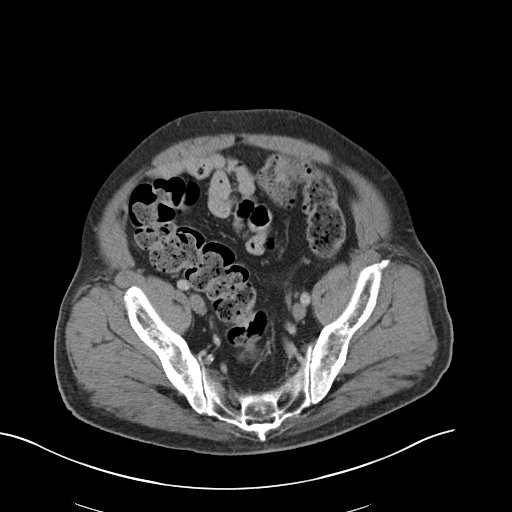
[im 43/96  soft-tissue]
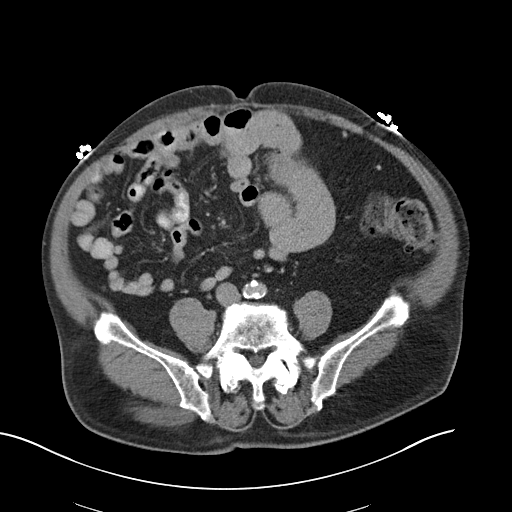
[im 48/96  soft-tissue]
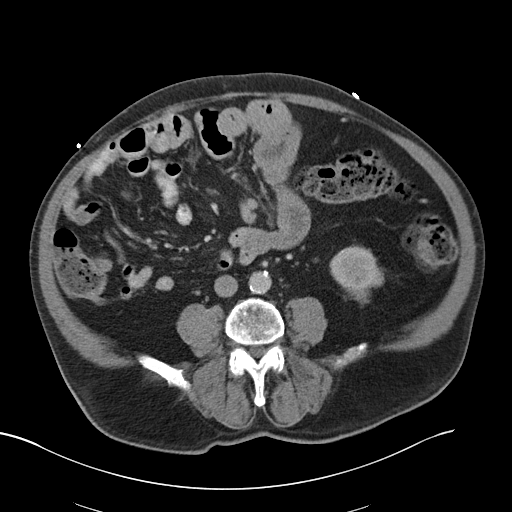
[im 53/96  soft-tissue]
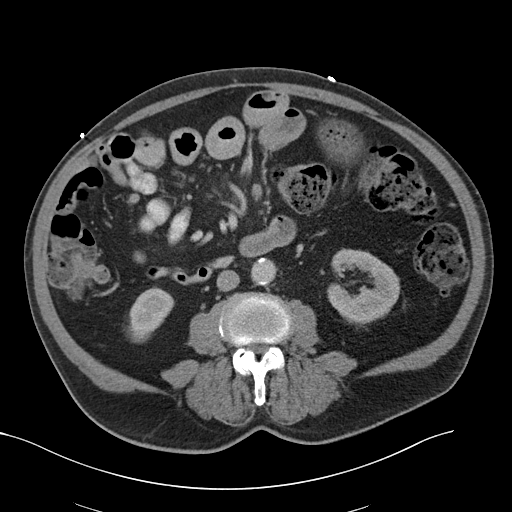
[im 64/96  soft-tissue]
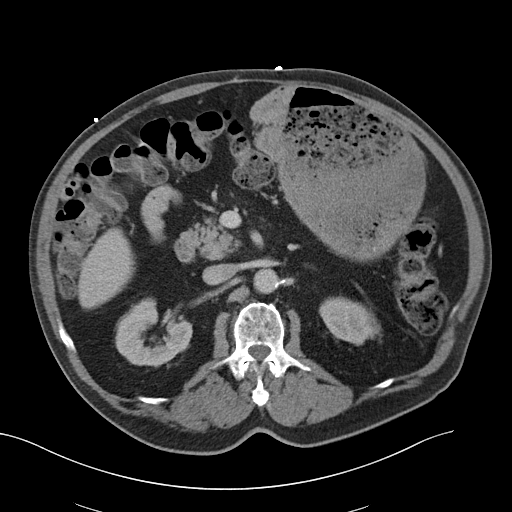
[im 64/96  bone]
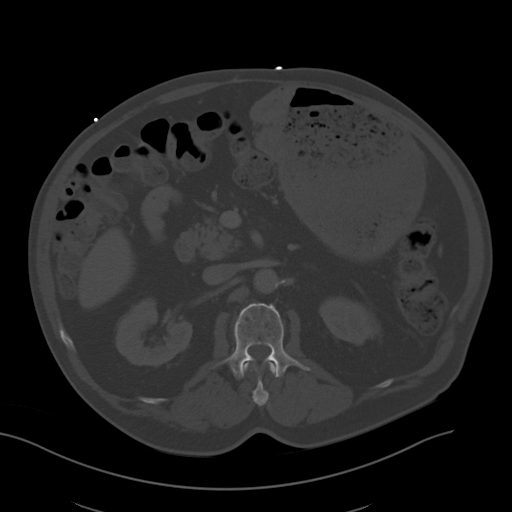
[im 69/96  soft-tissue]
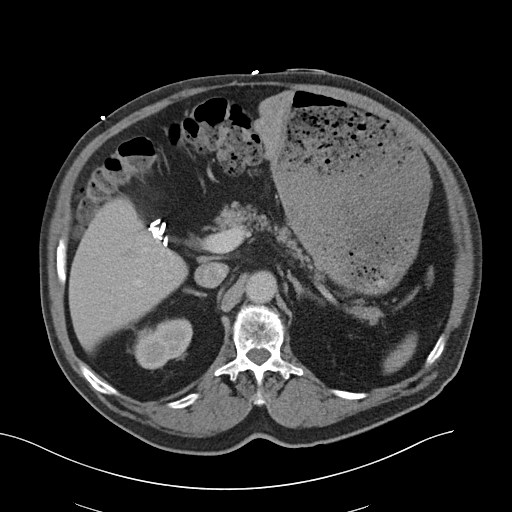
[im 74/96  soft-tissue]
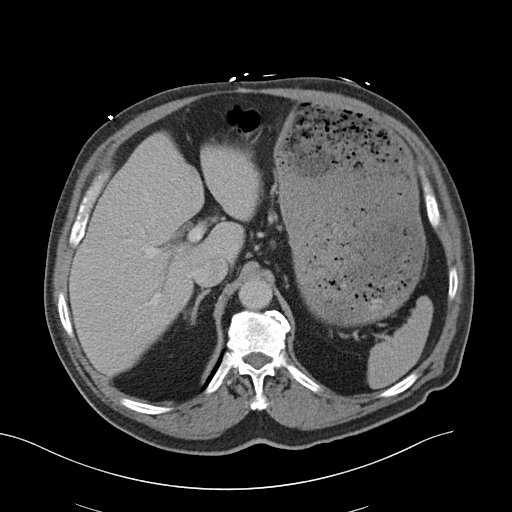
[im 85/96  soft-tissue]
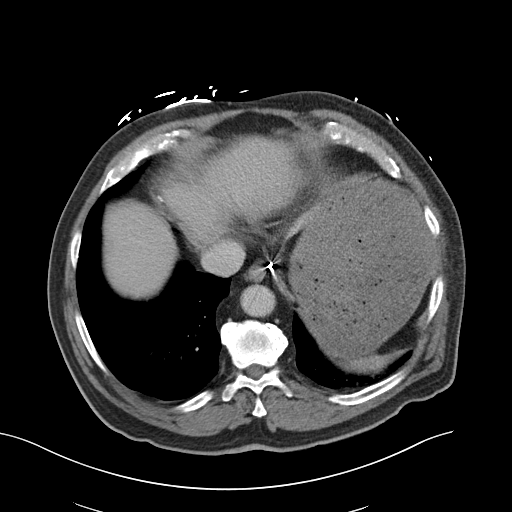
[im 90/96  soft-tissue]
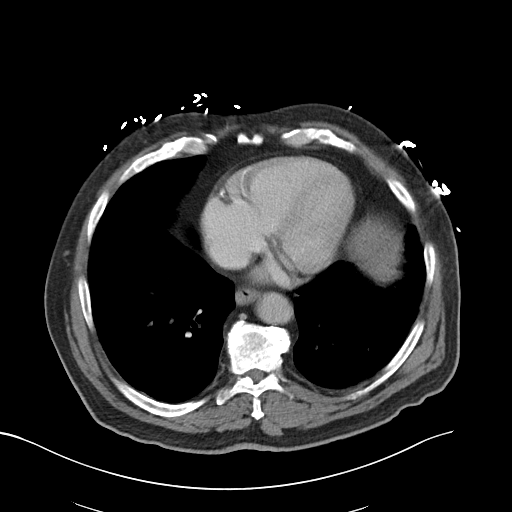

[Series 5: coronals · coronal · 0.96mm/px · 3 of 167 slices shown]
[im 56/167  soft-tissue]
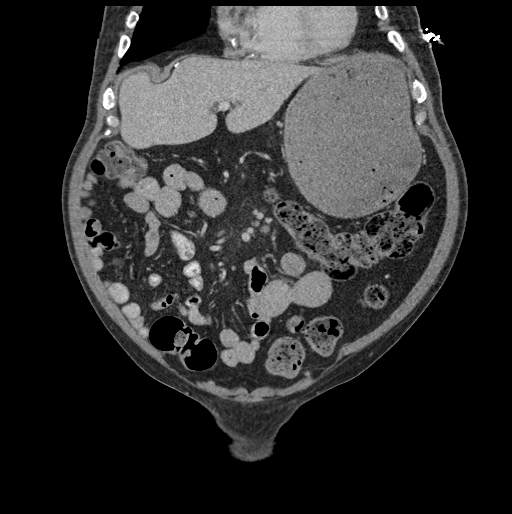
[im 74/167  soft-tissue]
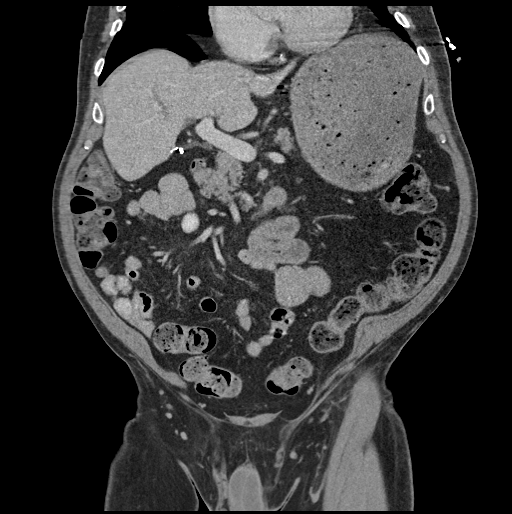
[im 93/167  soft-tissue]
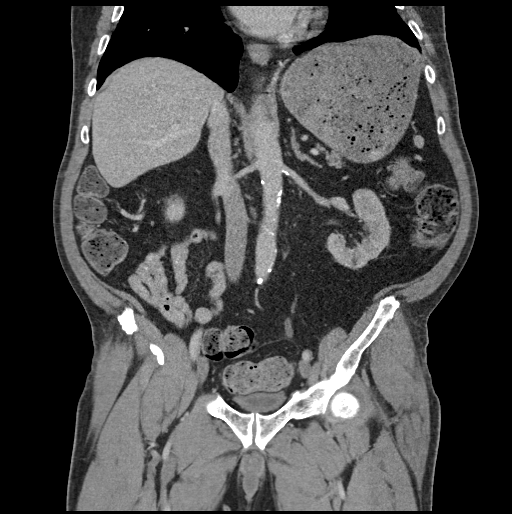

[16 of 46 positions shown; findings below may reference images not displayed]

FINDINGS: Minimal left basilar atelectasis is noted. Diffuse coronary artery
calcifications are seen. Postoperative change is noted about the
gastroesophageal junction.

The liver and spleen are unremarkable in appearance. The patient is
status post cholecystectomy, with clips noted at the gallbladder
fossa. The pancreas and adrenal glands are unremarkable.

Mild nonspecific perinephric stranding is noted bilaterally. A 7 mm
stone is noted at the lower pole of the right kidney. The kidneys
are otherwise unremarkable. There is no evidence of hydronephrosis.
No adrenal residual stones are seen.

No free fluid is identified. The small bowel is unremarkable in
appearance. The stomach is filled with solid material and within
normal limits. No acute vascular abnormalities are seen. Scattered
calcification is seen along the abdominal aorta and its branches,
with minimal ectasia of the infrarenal abdominal aorta.

The appendix is normal in size and contains air, without evidence of
appendicitis. Scattered diverticulosis is noted along the descending
colon, without evidence of diverticulitis.

The bladder is mildly distended and is grossly unremarkable in
appearance. The prostate is normal in size, with scattered
calcification. No inguinal lymphadenopathy is seen.

No acute osseous abnormalities are identified. Facet disease is
noted at the lower lumbar spine.
IMPRESSION: 1. No acute abnormality seen within the abdomen or pelvis.
2. Nonobstructing 7 mm stone at the lower pole of the right kidney.
3. Scattered calcification along the abdominal aorta and its
branches.
4. Scattered diverticulosis along the descending colon, without
evidence of diverticulitis.
5. Diffuse coronary artery calcifications seen.

## 2015-11-29 IMAGING — DX DG CHEST 2V
2 series · 2 of 2 positions shown · non-contrast
Comparison: Chest radiograph performed 09/14/2014

CLINICAL DATA: Acute onset of generalized chest pain and left upper
quadrant abdominal pain. Initial encounter.

EXAM:
CHEST  2 VIEW

[chest pa]
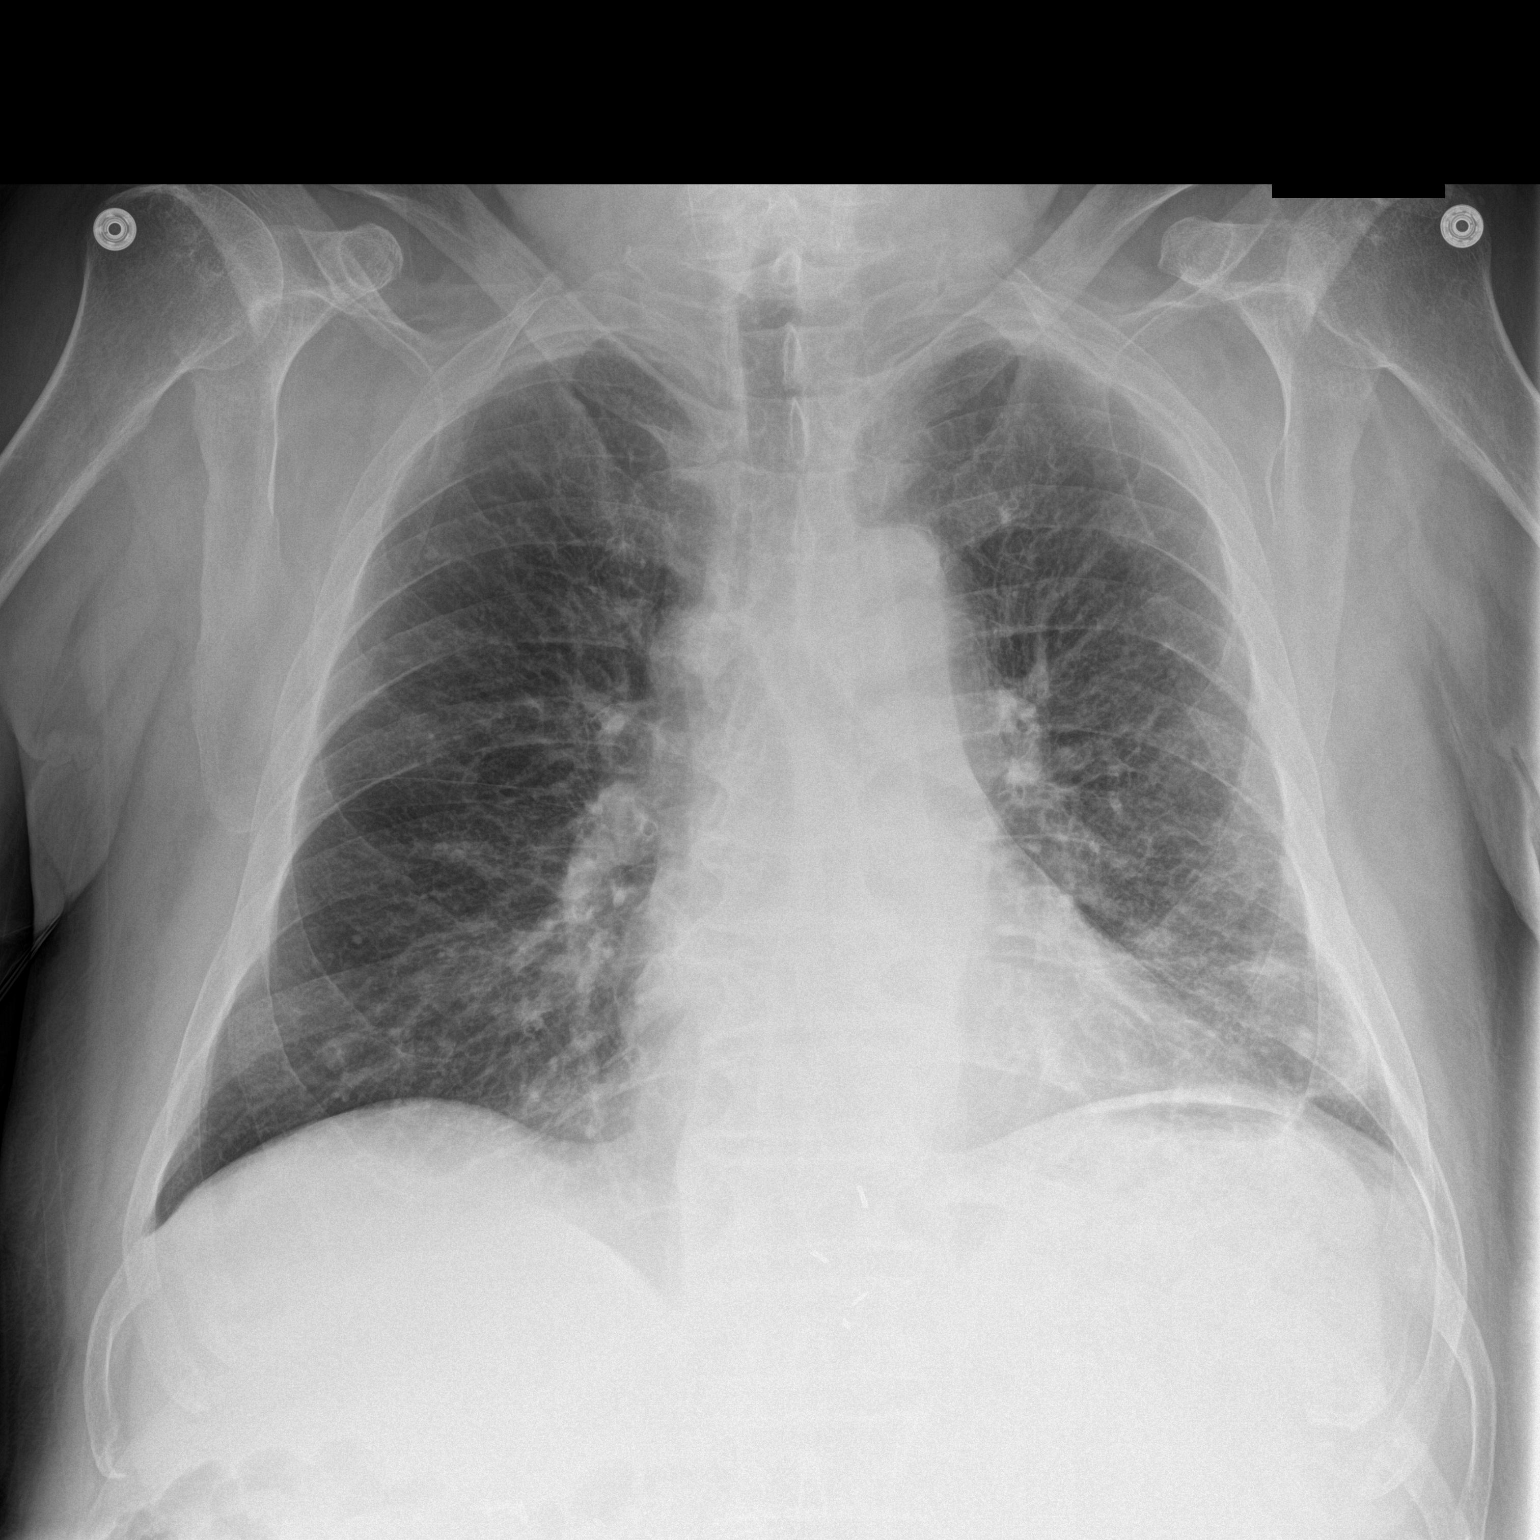

[chest lat]
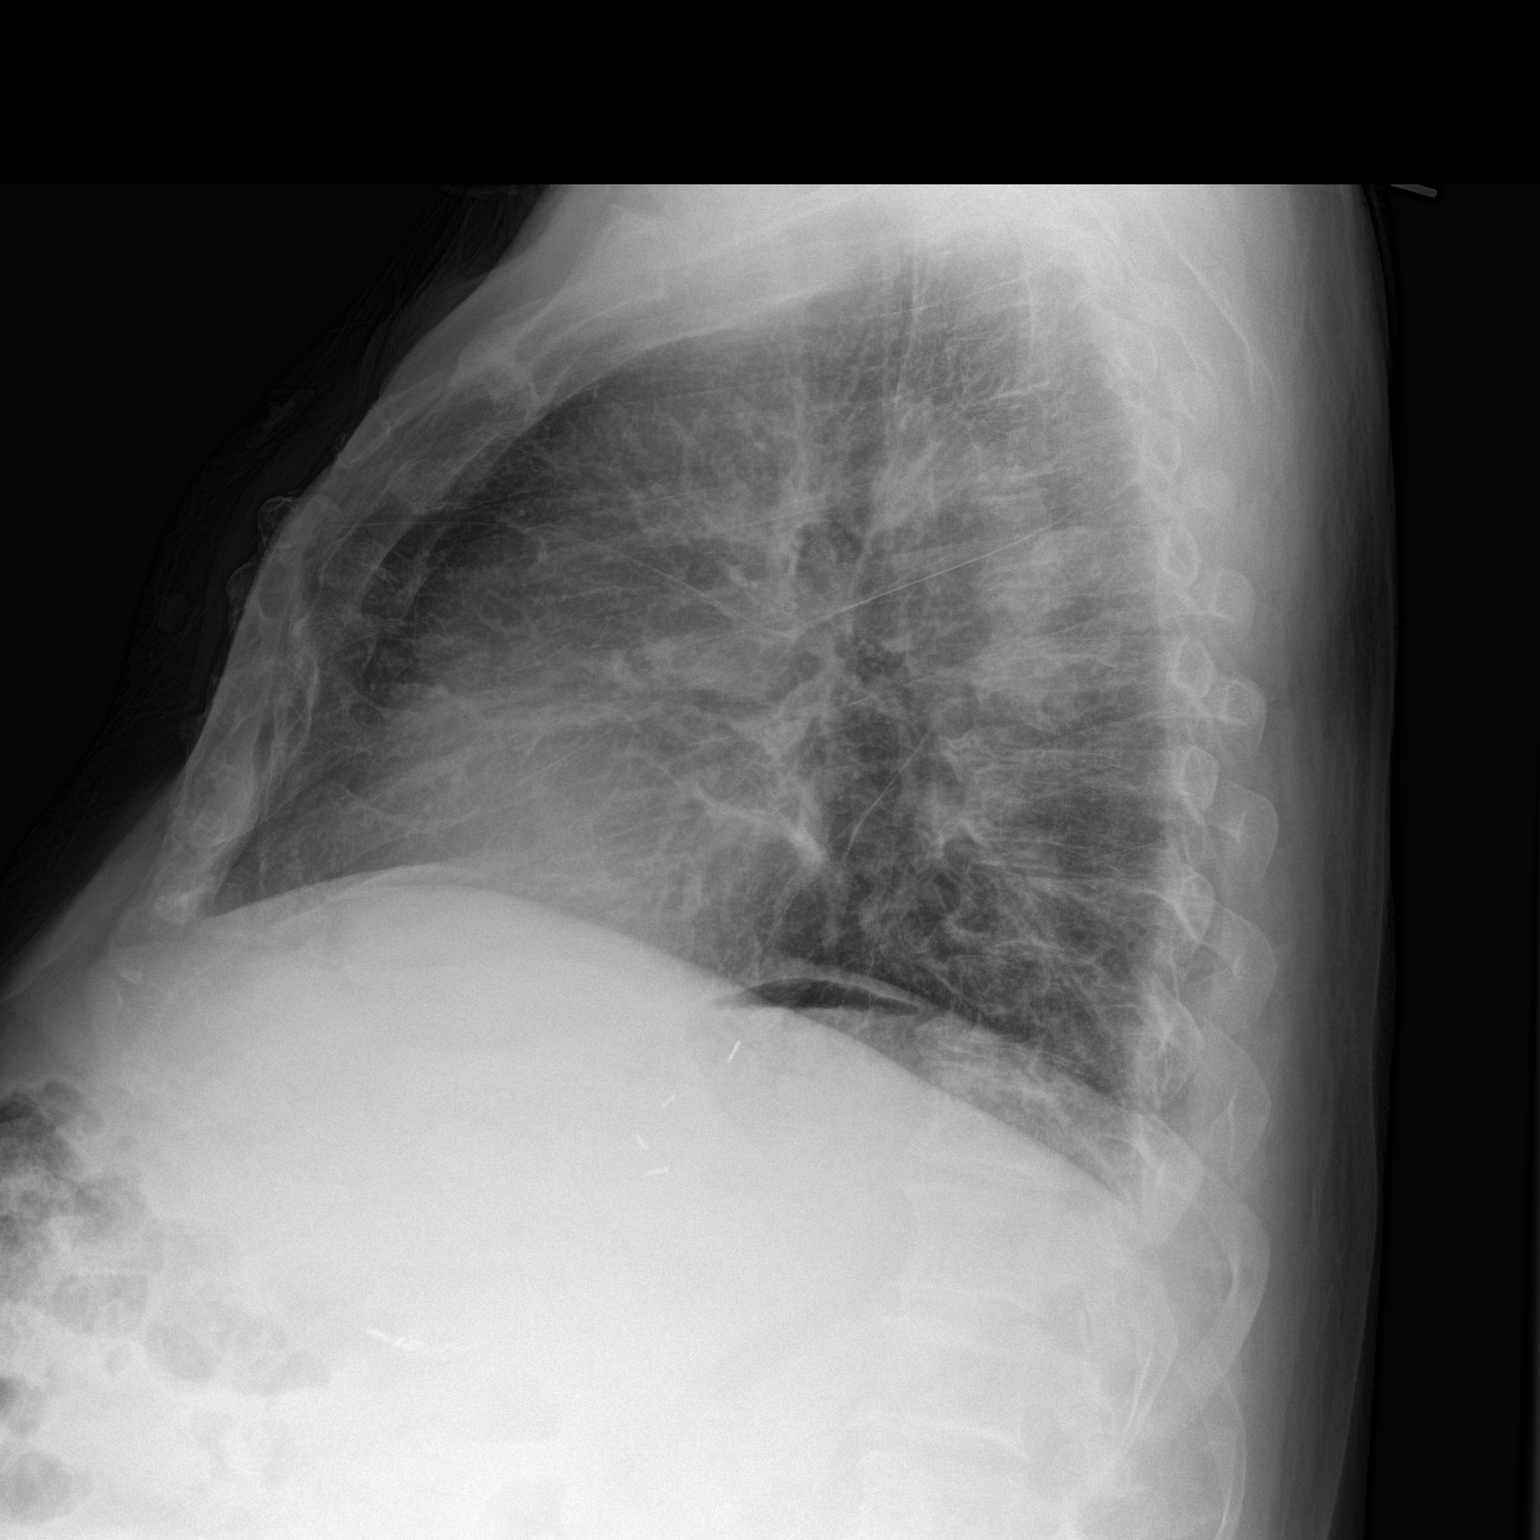

[2 of 2 positions shown; findings below may reference images not displayed]

FINDINGS: Patchy bilateral airspace opacities, more prominent at the left lung
base, raise concern for multifocal pneumonia. No pleural effusion or
pneumothorax is seen. Underlying peribronchial thickening is noted.

The heart remains normal in size. No acute osseous abnormalities are
identified. Postoperative change is noted about the gastroesophageal
junction.
IMPRESSION: Patchy bilateral airspace opacities, more prominent at the left lung
base, raise concern for multifocal pneumonia. Underlying
peribronchial thickening noted.

## 2015-12-03 IMAGING — CT CT ABD-PELV W/ CM
2 of 5 series · 16 of 46 positions shown, 18 images · IV contrast (Omni 300)
Comparison: 09/18/2014

CLINICAL DATA: Left-sided abdominal pain. History of
diverticulosis.

EXAM:
CT ABDOMEN AND PELVIS WITH CONTRAST
TECHNIQUE: Multidetector CT imaging of the abdomen and pelvis was performed
using the standard protocol following bolus administration of
intravenous contrast.
CONTRAST:  100mL OMNIPAQUE IOHEXOL 300 MG/ML  SOLN

[Series 2: abd/ pelvis 5.0 i30f 1 · axial · 0.94mm/px · z∈[-347,+78]mm · 13 of 97 slices shown, 15 images]
[im 6/97  soft-tissue]
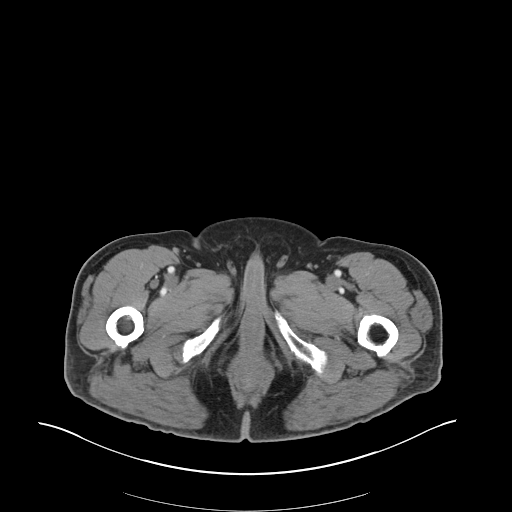
[im 6/97  bone]
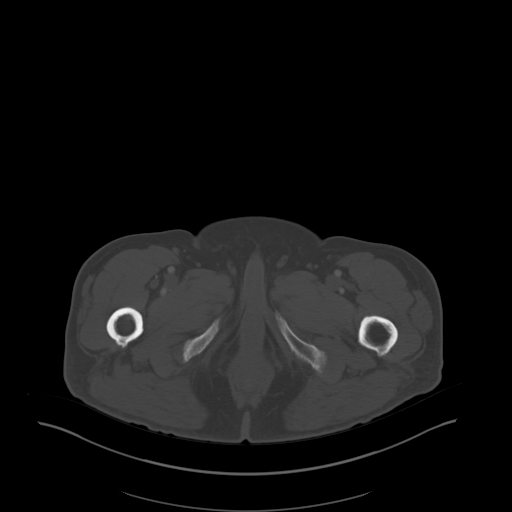
[im 16/97  soft-tissue]
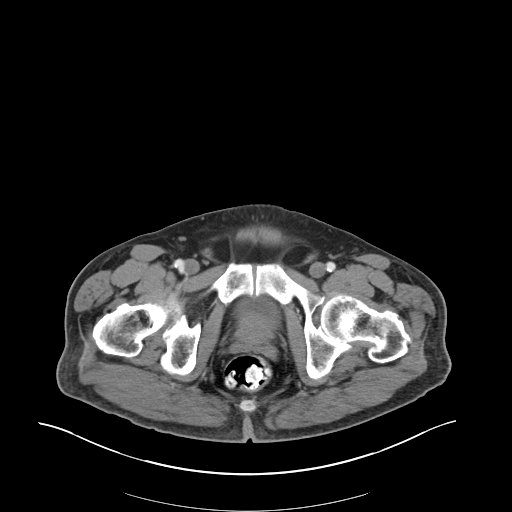
[im 21/97  soft-tissue]
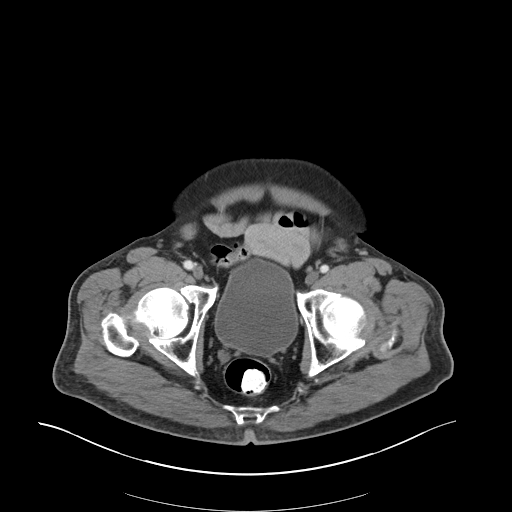
[im 26/97  soft-tissue]
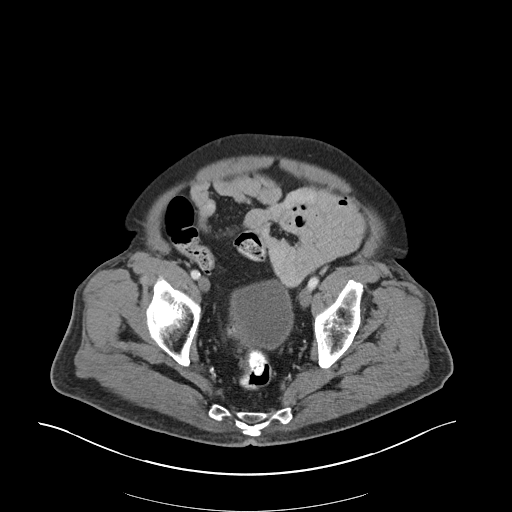
[im 36/97  soft-tissue]
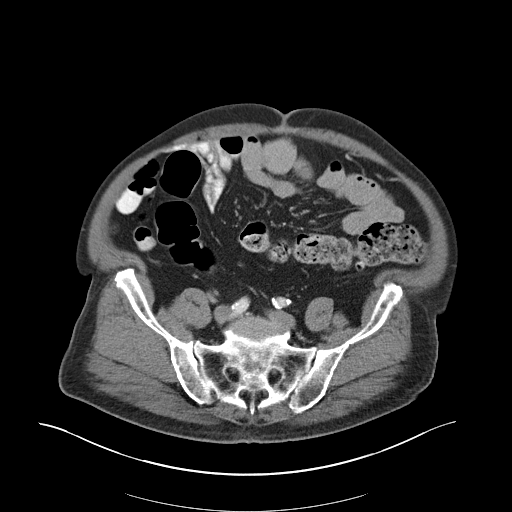
[im 41/97  soft-tissue]
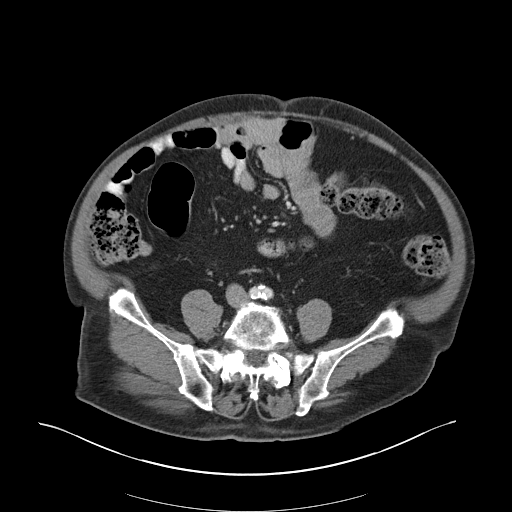
[im 51/97  soft-tissue]
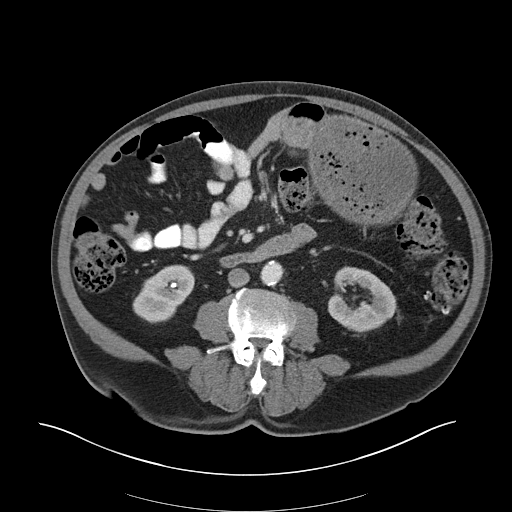
[im 56/97  soft-tissue]
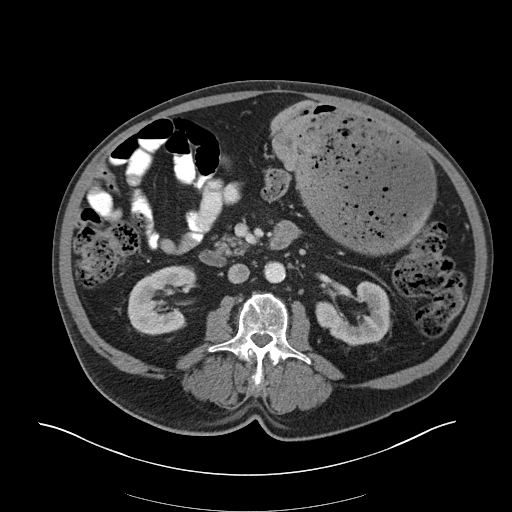
[im 61/97  soft-tissue]
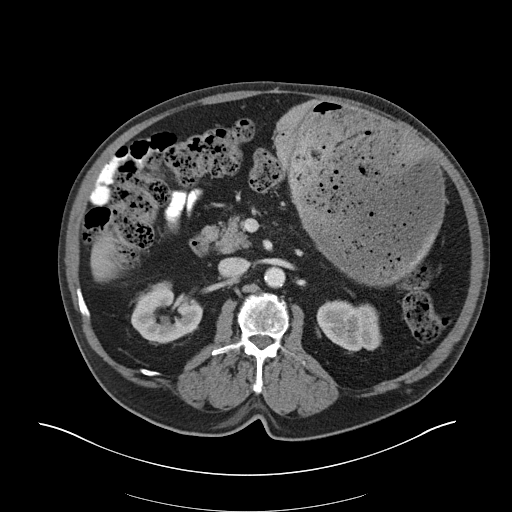
[im 61/97  bone]
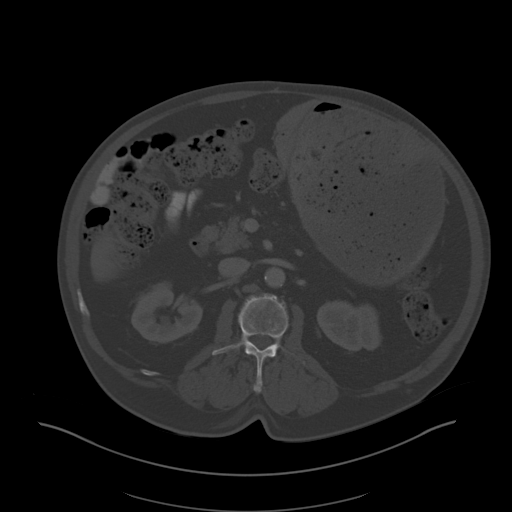
[im 71/97  soft-tissue]
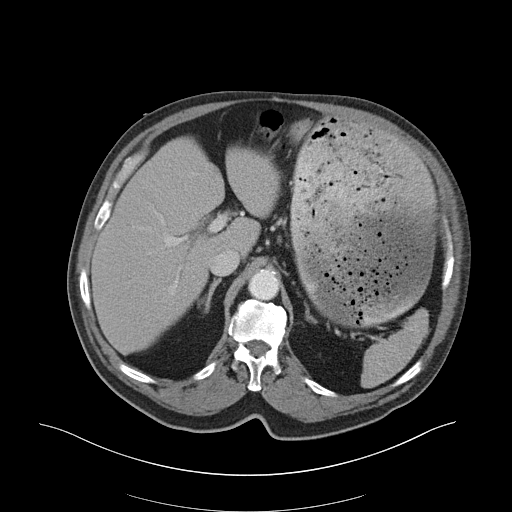
[im 76/97  soft-tissue]
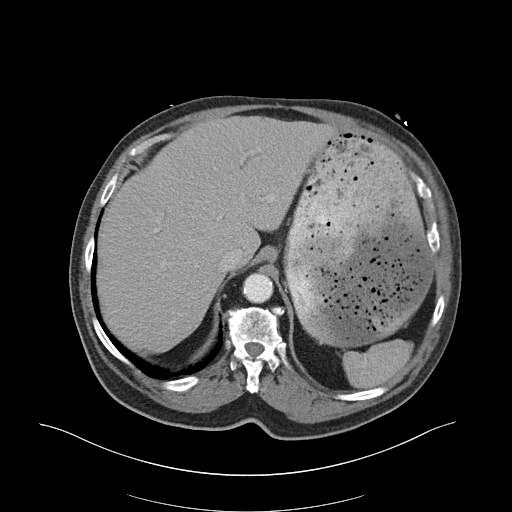
[im 81/97  soft-tissue]
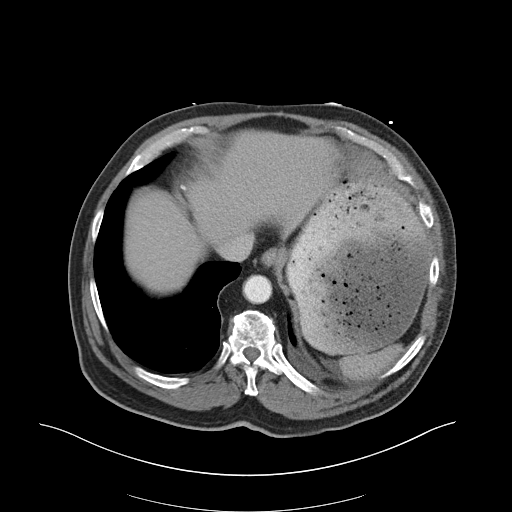
[im 91/97  soft-tissue]
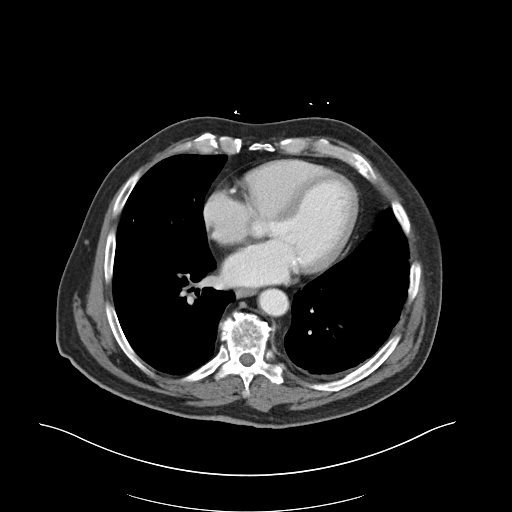

[Series 5: coronals · coronal · 0.82mm/px · 3 of 160 slices shown]
[im 54/160  soft-tissue]
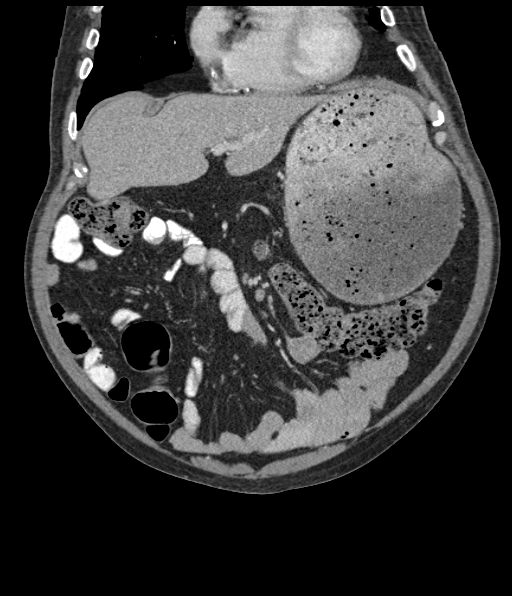
[im 71/160  soft-tissue]
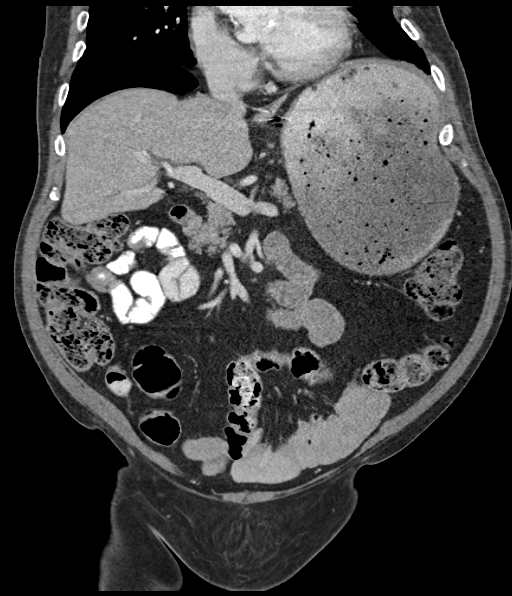
[im 89/160  soft-tissue]
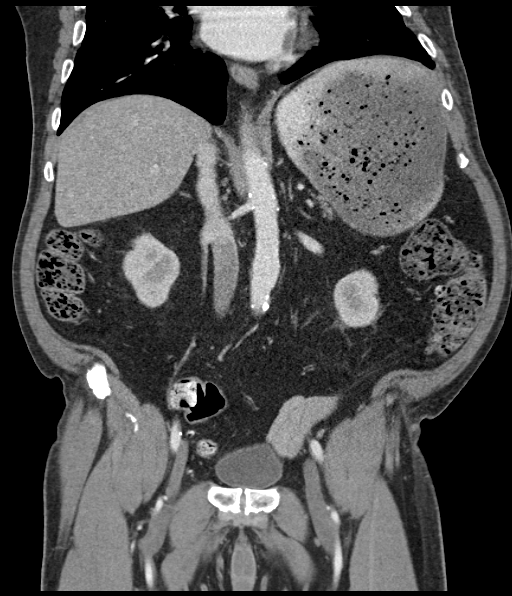

[16 of 46 positions shown; findings below may reference images not displayed]

FINDINGS: There is marked gastric distention there is probably a prior
antrectomy and gastrojejunostomy. There is extensive colonic
diverticulosis. There is no evidence of diverticulitis or other
acute inflammatory process. Small bowel is normal.

There are normal appearances of the liver. There is prior
cholecystectomy. Biliary system is unremarkable.

The pancreas, spleen, and adrenals appear unremarkable.

There is a 7 x 10 mm lower pole right collecting system renal
calculus. There is another calculus immediately anterior to this,
measuring 3 x 4 mm. The kidneys and ureters are otherwise
unremarkable in appearance. Urinary bladder appears unremarkable.

The abdominal aorta is normal in caliber. There is moderate
atherosclerotic calcification. There is no adenopathy in the abdomen
or pelvis.

There is a new trace pleural effusion on the left with slight
adjacent atelectatic appearing posterior left lower lobe airspace
opacity. There is a calcified granuloma in the right middle lobe
base.

There is no significant musculoskeletal lesion. There is moderately
severe facet arthritis, greatest on the left at L4-5 and L5-S1.
IMPRESSION: 1. Diverticulosis without evidence of diverticulitis.
2. Marked gastric distention. Probable prior antrectomy and
gastrojejunostomy.
3. 7 x 10 mm lower pole right renal calculus. Adjacent 3 x 4 mm
calculus.
4. Trace left pleural effusion, new from 09/18/2014. Minimal
adjacent atelectatic airspace opacities.
5. Lumbar facet arthritis, greatest on the left at L4-5 and L5-S1.

## 2015-12-10 IMAGING — DX DG CHEST 2V
2 series · 2 of 2 positions shown · non-contrast
Comparison: Chest radiograph 09/18/2014 semi: CT abdomen pelvis
09/22/2014 and 08/01/2013 chest radiograph 08/01/2013.

CLINICAL DATA: Patient with shortness of breath and left arm
numbness extending into the left shoulder. This has been present for
2 months.

EXAM:
CHEST  2 VIEW

[chest pa]
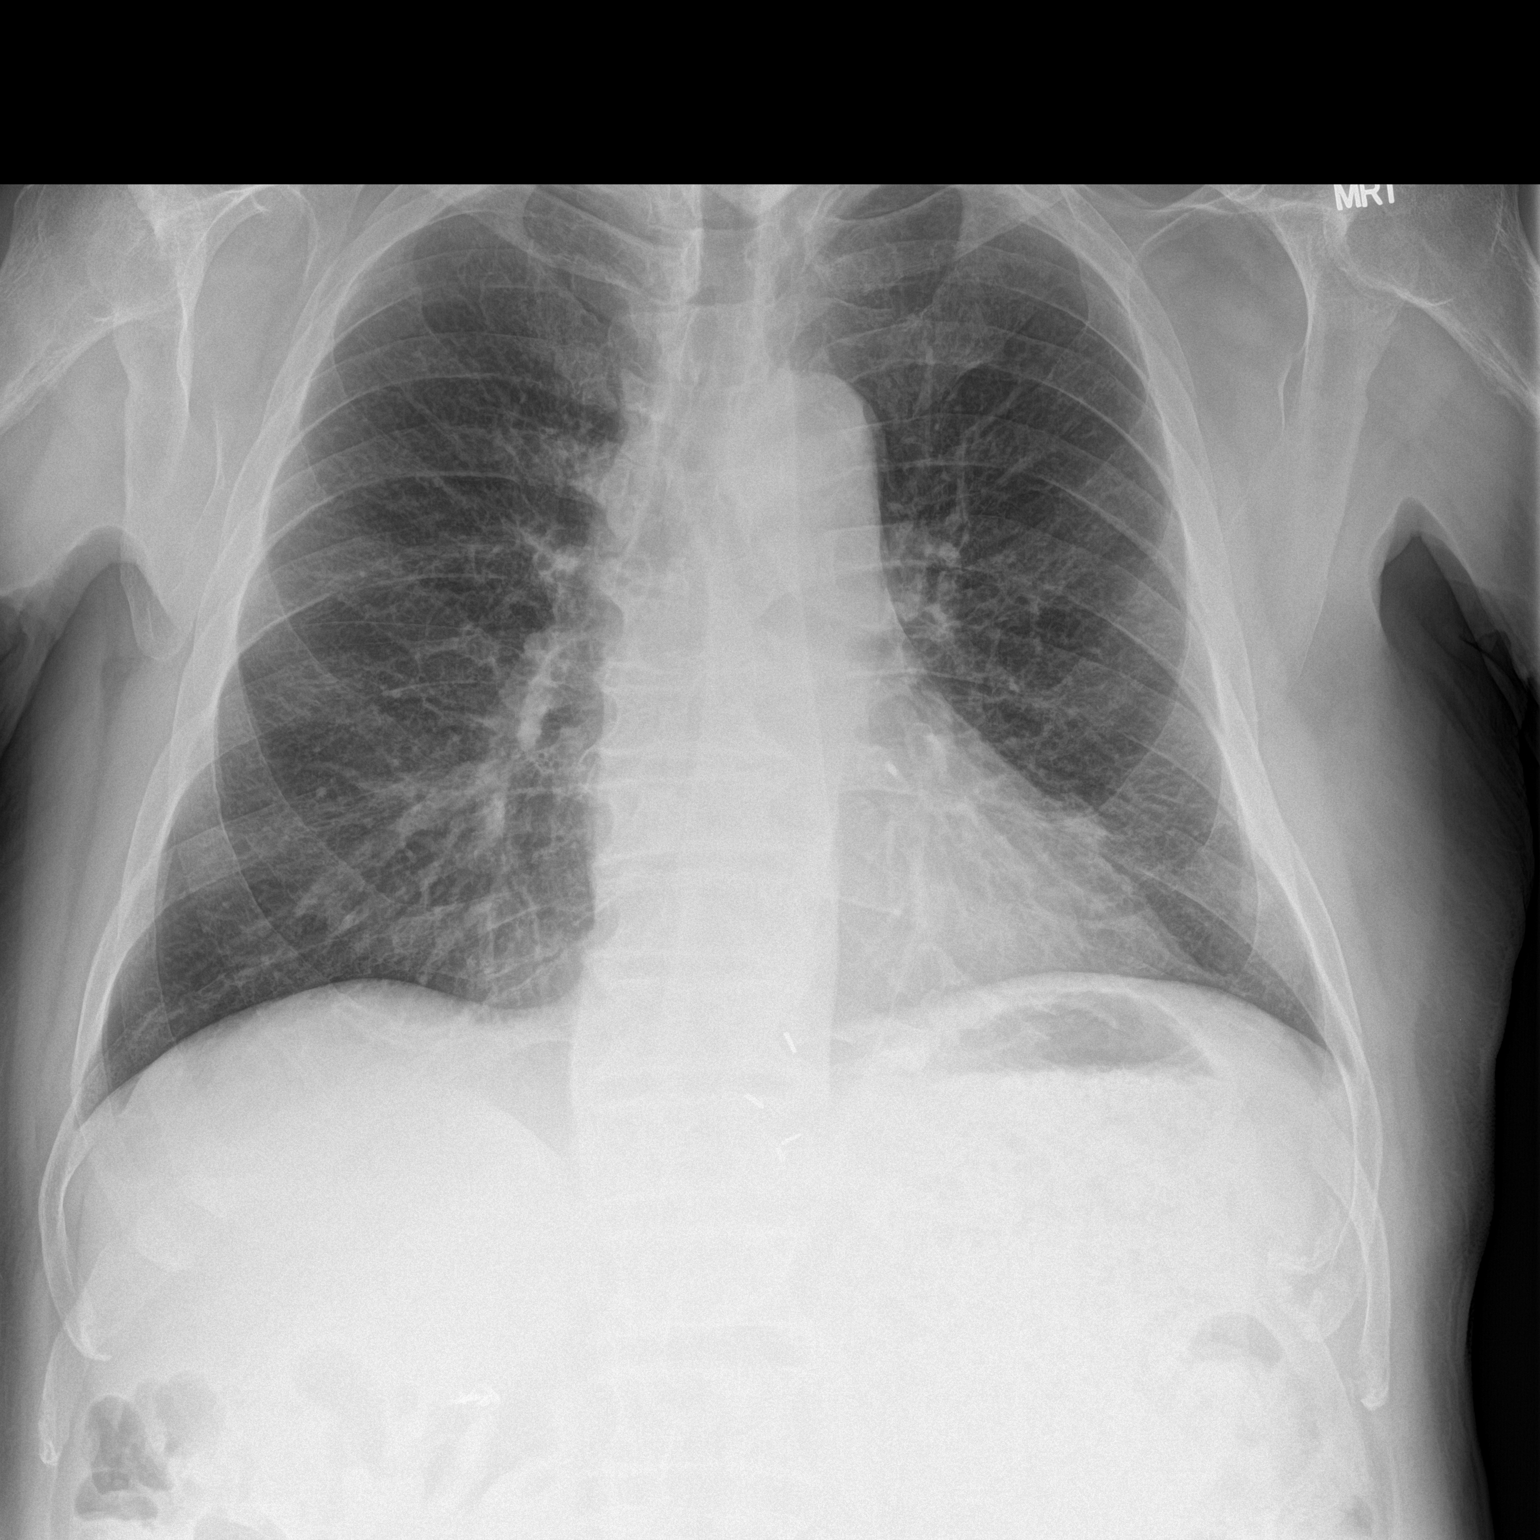

[chest lat]
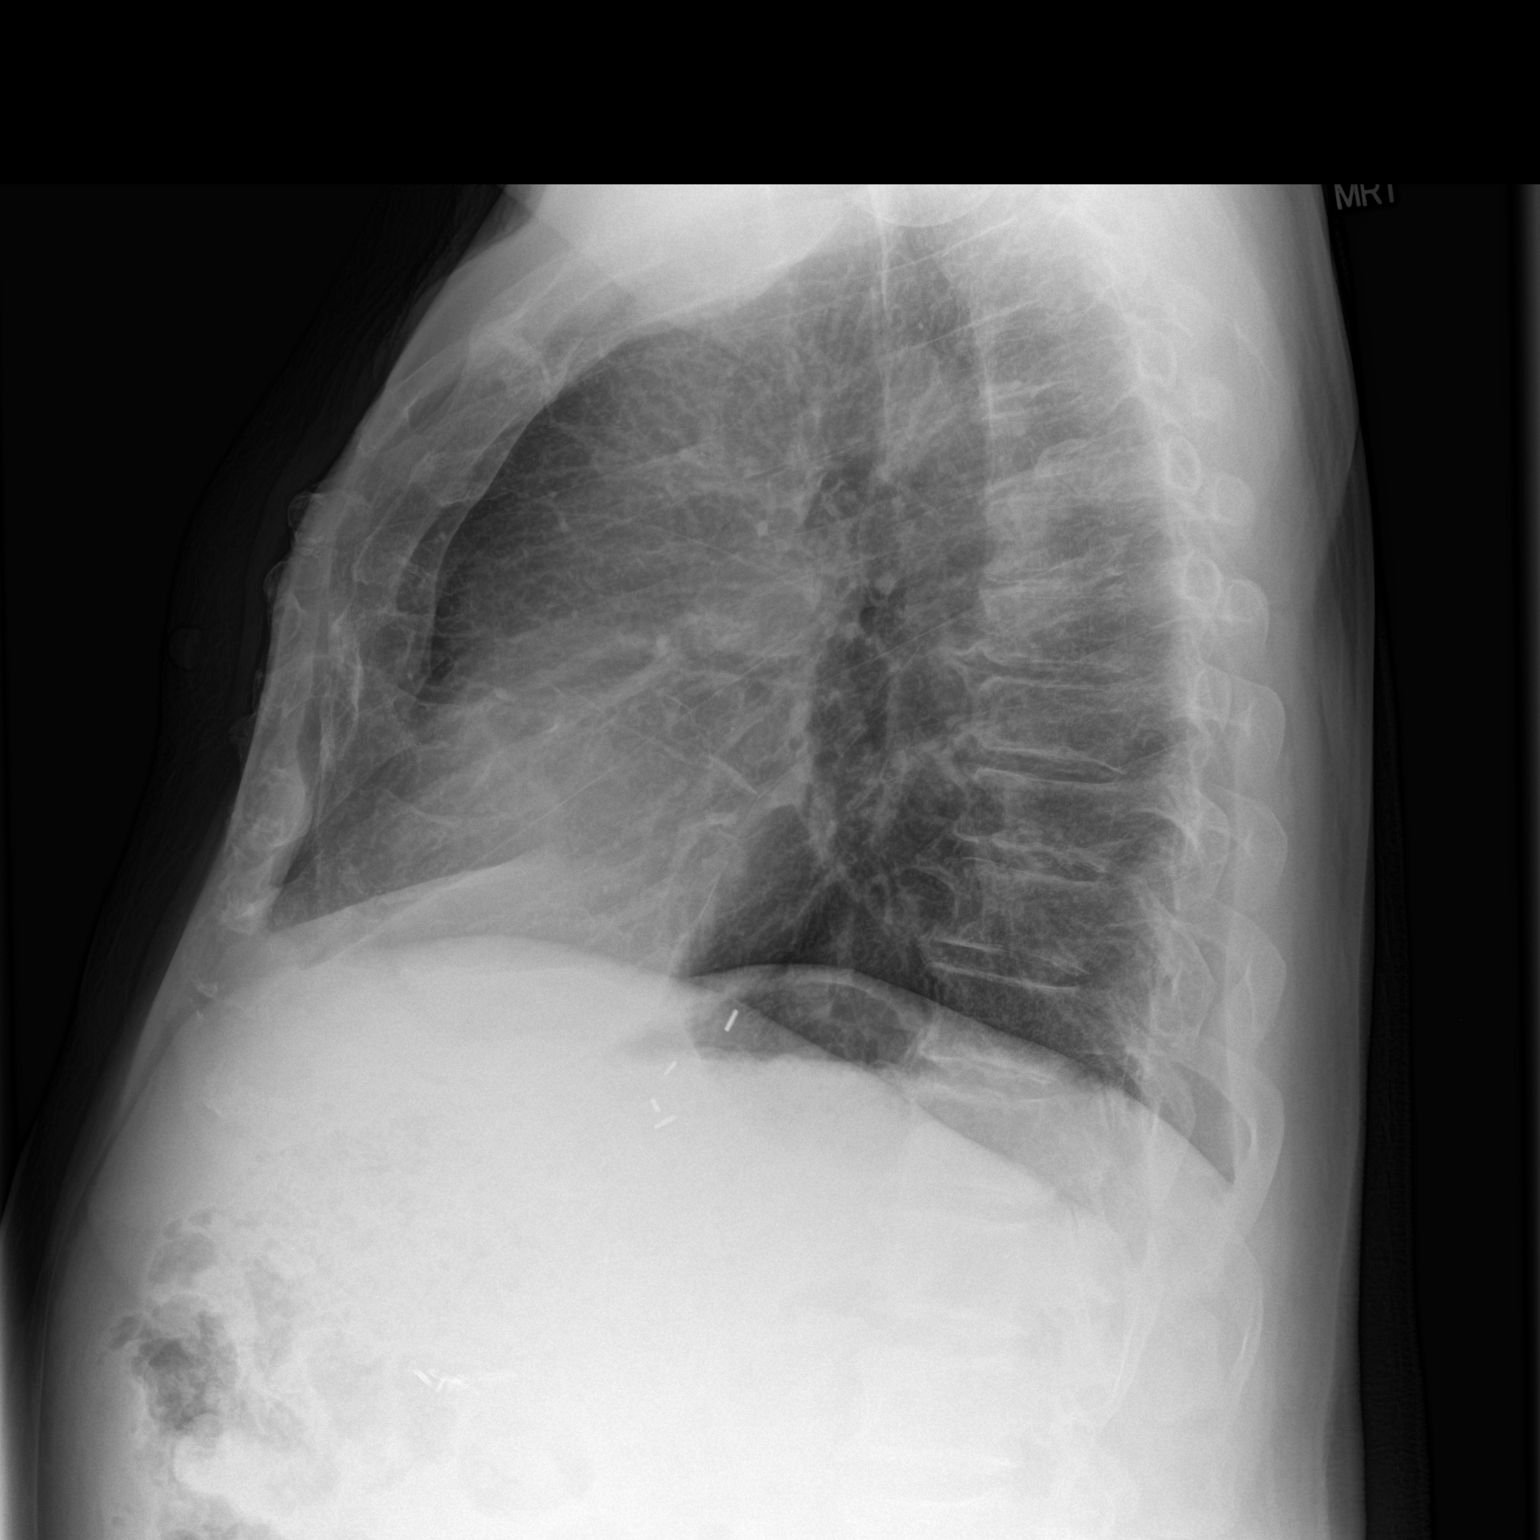

[2 of 2 positions shown; findings below may reference images not displayed]

FINDINGS: Stable cardiac and mediastinal contours. Interval improvement but
not complete resolution of previously described patchy opacities
within the left lower hemi thorax. There is a 9 mm nodule within the
left lower hemi thorax. No definite pleural effusion or
pneumothorax. Mid thoracic spine degenerative changes. Upper
abdominal surgical clips.
IMPRESSION: Suggestion of a 9 mm nodular density projecting over the lateral
left lower hemi thorax which may represent nipple shadow. Prior to
CT evaluation, recommend further evaluation with repeat radiograph
and nipple markers. If this does not correspond with nipple shadow,
CT would be warranted in the non acute setting.

Interval improvement in previously described heterogeneous opacities
left lower lobe, potentially resolving infection.

## 2015-12-20 ENCOUNTER — Other Ambulatory Visit: Payer: Self-pay | Admitting: Cardiovascular Disease

## 2015-12-20 ENCOUNTER — Other Ambulatory Visit: Payer: Self-pay | Admitting: Cardiology

## 2015-12-20 ENCOUNTER — Telehealth: Payer: Self-pay | Admitting: Cardiology

## 2015-12-20 MED ORDER — WARFARIN SODIUM 5 MG PO TABS: ORAL_TABLET | ORAL | 1 refills | Status: DC

## 2015-12-20 NOTE — Telephone Encounter (Signed)
Rx request sent to pharmacy.  

## 2015-12-20 NOTE — Telephone Encounter (Signed)
New Message   *STAT* If patient is at the pharmacy, call can be transferred to refill team.   1. Which medications need to be refilled? (please list name of each medication and dose if known) Warfarin 5mg    2. Which pharmacy/location (including street and city if local pharmacy) is medication to be sent to? [facility] [address] [place].  3. Do they need a 30 day or 90 day supply? 30    Per [name] pt will be out of medication tomorrow 9/26

## 2015-12-31 ENCOUNTER — Ambulatory Visit (INDEPENDENT_AMBULATORY_CARE_PROVIDER_SITE_OTHER): Payer: Medicare Other | Admitting: Pharmacist

## 2015-12-31 DIAGNOSIS — I480 Paroxysmal atrial fibrillation: Secondary | ICD-10-CM

## 2015-12-31 DIAGNOSIS — Z7901 Long term (current) use of anticoagulants: Secondary | ICD-10-CM

## 2015-12-31 LAB — POCT INR: INR: 2.7

## 2016-02-11 ENCOUNTER — Ambulatory Visit (INDEPENDENT_AMBULATORY_CARE_PROVIDER_SITE_OTHER): Payer: Medicare Other | Admitting: Pharmacist

## 2016-02-11 ENCOUNTER — Ambulatory Visit (INDEPENDENT_AMBULATORY_CARE_PROVIDER_SITE_OTHER): Payer: Medicare Other | Admitting: Ophthalmology

## 2016-02-11 DIAGNOSIS — I2510 Atherosclerotic heart disease of native coronary artery without angina pectoris: Secondary | ICD-10-CM

## 2016-02-11 DIAGNOSIS — Z7901 Long term (current) use of anticoagulants: Secondary | ICD-10-CM | POA: Diagnosis not present

## 2016-02-11 DIAGNOSIS — I480 Paroxysmal atrial fibrillation: Secondary | ICD-10-CM | POA: Diagnosis not present

## 2016-02-11 LAB — POCT INR: INR: 2

## 2016-02-23 ENCOUNTER — Emergency Department (HOSPITAL_COMMUNITY): Payer: Medicare Other

## 2016-02-23 ENCOUNTER — Emergency Department (HOSPITAL_COMMUNITY)
Admission: EM | Admit: 2016-02-23 | Discharge: 2016-02-23 | Disposition: A | Discharge status: Home / Self Care | Payer: Medicare Other | Attending: Emergency Medicine | Admitting: Emergency Medicine

## 2016-02-23 ENCOUNTER — Encounter (HOSPITAL_COMMUNITY): Payer: Self-pay | Admitting: *Deleted

## 2016-02-23 DIAGNOSIS — Z7901 Long term (current) use of anticoagulants: Secondary | ICD-10-CM | POA: Insufficient documentation

## 2016-02-23 DIAGNOSIS — Z8673 Personal history of transient ischemic attack (TIA), and cerebral infarction without residual deficits: Secondary | ICD-10-CM | POA: Diagnosis not present

## 2016-02-23 DIAGNOSIS — I110 Hypertensive heart disease with heart failure: Secondary | ICD-10-CM | POA: Insufficient documentation

## 2016-02-23 DIAGNOSIS — R0789 Other chest pain: Secondary | ICD-10-CM

## 2016-02-23 DIAGNOSIS — Z7982 Long term (current) use of aspirin: Secondary | ICD-10-CM | POA: Insufficient documentation

## 2016-02-23 DIAGNOSIS — J449 Chronic obstructive pulmonary disease, unspecified: Secondary | ICD-10-CM | POA: Diagnosis not present

## 2016-02-23 DIAGNOSIS — Z794 Long term (current) use of insulin: Secondary | ICD-10-CM | POA: Diagnosis not present

## 2016-02-23 DIAGNOSIS — F17210 Nicotine dependence, cigarettes, uncomplicated: Secondary | ICD-10-CM | POA: Insufficient documentation

## 2016-02-23 DIAGNOSIS — I2510 Atherosclerotic heart disease of native coronary artery without angina pectoris: Secondary | ICD-10-CM | POA: Diagnosis not present

## 2016-02-23 DIAGNOSIS — I5032 Chronic diastolic (congestive) heart failure: Secondary | ICD-10-CM | POA: Diagnosis not present

## 2016-02-23 DIAGNOSIS — J40 Bronchitis, not specified as acute or chronic: Secondary | ICD-10-CM | POA: Diagnosis not present

## 2016-02-23 DIAGNOSIS — E119 Type 2 diabetes mellitus without complications: Secondary | ICD-10-CM | POA: Diagnosis not present

## 2016-02-23 DIAGNOSIS — R05 Cough: Secondary | ICD-10-CM | POA: Diagnosis present

## 2016-02-23 LAB — PROTIME-INR
INR: 1.94
PROTHROMBIN TIME: 22.5 s — AB (ref 11.4–15.2)

## 2016-02-23 LAB — BASIC METABOLIC PANEL
ANION GAP: 9 (ref 5–15)
BUN: 15 mg/dL (ref 6–20)
CALCIUM: 8.9 mg/dL (ref 8.9–10.3)
CO2: 21 mmol/L — ABNORMAL LOW (ref 22–32)
Chloride: 107 mmol/L (ref 101–111)
Creatinine, Ser: 0.85 mg/dL (ref 0.61–1.24)
GFR calc Af Amer: >60 mL/min (ref >60)
Glucose, Bld: 146 mg/dL — ABNORMAL HIGH (ref 65–99)
POTASSIUM: 4 mmol/L (ref 3.5–5.1)
SODIUM: 137 mmol/L (ref 135–145)

## 2016-02-23 LAB — CBC
HEMATOCRIT: 31 % — AB (ref 39.0–52.0)
HEMOGLOBIN: 9.6 g/dL — AB (ref 13.0–17.0)
MCH: 25.3 pg — ABNORMAL LOW (ref 26.0–34.0)
MCHC: 31 g/dL (ref 30.0–36.0)
MCV: 81.8 fL (ref 78.0–100.0)
Platelets: 298 10*3/uL (ref 150–400)
RBC: 3.79 MIL/uL — ABNORMAL LOW (ref 4.22–5.81)
RDW: 14.8 % (ref 11.5–15.5)
WBC: 6.9 10*3/uL (ref 4.0–10.5)

## 2016-02-23 LAB — I-STAT TROPONIN, ED: TROPONIN I, POC: 0 ng/mL (ref 0.00–0.08)

## 2016-02-23 MED ORDER — ALBUTEROL SULFATE HFA 108 (90 BASE) MCG/ACT IN AERS
2.0000 | INHALATION_SPRAY | Freq: Once | RESPIRATORY_TRACT | Status: AC
  Administered 2016-02-23: 2 via RESPIRATORY_TRACT
  Filled 2016-02-23: qty 6.7

## 2016-02-23 MED ORDER — HYDROCODONE-HOMATROPINE 5-1.5 MG/5ML PO SYRP: 5.0000 mL | ORAL_SOLUTION | Freq: Four times a day (QID) | ORAL | 0 refills | Status: DC | PRN

## 2016-02-23 MED ORDER — HYDROCODONE-ACETAMINOPHEN 5-325 MG PO TABS
1.0000 | ORAL_TABLET | Freq: Once | ORAL | Status: AC
  Administered 2016-02-23: 1 via ORAL
  Filled 2016-02-23: qty 1

## 2016-02-23 NOTE — ED Provider Notes (Signed)
[facility] DEPT Provider Note   CSN: [id number] Arrival date & time: 02/23/16  1149     History   Chief Complaint Chief Complaint  Patient presents with  . Cough  . Abdominal Pain    HPI [name] is a 75 y.o. male.  HPI  Patient has had cough for a first of 3 weeks. He reports that he is sits aching along the bottoms of his ribs on both sides. Then the aching also goes to his shoulders. He denies any fever. Occasionally he feels some shortness of breath. Patient does have a smoking history. He used to smoke 2 packs per day but he's been down to a half pack per day for quite some time. He does not have any inhaler available at this time. He also reports that he has some problems with chronic back pain and his physician gives him a Vicodin prescription each month. He reports that the prescription allows him to use 1 tablet a day. He reports it usually is helpful but sometimes he has to take an extra tablet of his back pain is worse. Sometimes he runs out early and now he can't fill his prescription until the end of the week. Past Medical History:  Diagnosis Date  . Anemia    takes Ferrous Sulfate daily  . Arthritis    "all over"  . Atrial flutter ([place])    a. 07/2010 Status post caval tricuspid isthmus ablation by Dr. Midge Aver Metoprolol daily  . Balance problem 01/2014  . CAP (community acquired pneumonia) 09/18/2014  . Cervical radiculopathy due to degenerative joint disease of spine   . COPD (chronic obstructive pulmonary disease) ([place])   . Coronary artery disease, non-occlusive    a. 03/2010 Nonocclusive disease by cath, performed for ST elevations on ECG;  b. 06/2013 Lexi MV: EF 60%, no ischemia.  . Diabetes mellitus type II    takes Metformin and Lantus daily  . Diastolic CHF, chronic ([place])    a. 12/2012 EF 55-60%, diast dysfxn, triv MR, mildly dil LA/RA.  [name] Dysrhythmia    HX OF ATRIAL IFB /FLUTTER takes Flecanide and Coumadin daily  . History of blood  transfusion 1982   "when I had stomach OR"  . History of bronchitis    1998  . History of gastric ulcer   . HTN (hypertension)    takes Diltiazem daily  . Hyperlipidemia    takes Pravastatin daily  . Joint pain   . PAF (paroxysmal atrial fibrillation) ([place])    a. Recurrent after atrial flutter, currently controlled on flecainide plus diltiazem  . Pneumonia 1999  . Weakness    numbness and tingling both hands    Patient Active Problem List   Diagnosis Date Noted  . Syncope 05/15/2015  . Cervical disc disease 05/15/2015  . Abnormal urinalysis 05/15/2015  . Anemia 05/15/2015  . Normocytic anemia 03/10/2015  . History of CVA (cerebrovascular accident) 04/03/2014  . Near syncope 02/15/2014  . Pre-syncope 02/15/2014  . Benign neoplasm of rectum and anal canal 12/02/2013  . Lower gastrointestinal bleeding 11/30/2013  . Lower GI bleed 11/30/2013  . Tobacco use disorder 11/30/2013  . PAF (paroxysmal atrial fibrillation), maintaining SR 08/15/2013  . Anticoagulation goal of INR 2 to 3, for PAF  08/15/2013  . Chest pain with low risk for cardiac etiology 07/22/2013  . Type 2 diabetes mellitus ([place]) 01/22/2013  . Tobacco abuse counseling 12/15/2012  . Hyperlipidemia   . Obesity (BMI 30-39.9) 12/14/2012  . Atrial flutter -  status post CTI ablation 07/29/2010  . Essential hypertension 07/29/2010  . Chronic diastolic congestive heart failure ([place]) 07/29/2010  . Coronary artery disease, non-occlusive 07/29/2010    Past Surgical History:  Procedure Laterality Date  . ATRIAL ABLATION SURGERY  08/05/10   CTI ablation for atrial flutter by [facility]  . CARDIAC CATHETERIZATION  2012   nl LV function, no occlusive CAD, PAF  . CARDIOVERSION  12/07/2010    Successful direct current cardioversion with atrial fibrillation to normal sinus rhythm  . CARPAL TUNNEL RELEASE Bilateral 01/30/2014   Procedure: BILATERAL CARPAL TUNNEL RELEASE;  Surgeon: Marianna Payment, MD;  Location: [facility];  Service:  Orthopedics;  Laterality: Bilateral;  . CATARACT EXTRACTION W/ INTRAOCULAR LENS  IMPLANT, BILATERAL Bilateral   . COLONOSCOPY [name] 12/02/2013   Procedure: COLONOSCOPY;  Surgeon: Irene Shipper, MD;  Location: [facility];  Service: Endoscopy;  Laterality: [name];  . ESOPHAGOGASTRODUODENOSCOPY [name] 09/22/2014   Procedure: ESOPHAGOGASTRODUODENOSCOPY (EGD);  Surgeon: Ronald Lobo, MD;  [facility];  Service: Endoscopy;  Laterality: [name];  . INCISION AND DRAINAGE ABSCESS / HEMATOMA OF BURSA / KNEE / THIGH Left 1998   knee  . KNEE BURSECTOMY Left 1998  . LAPAROSCOPIC CHOLECYSTECTOMY  03/2010  . NM MYOVIEW LTD  07/22/2013   Normal EF ~60%, no ischemia or infarction.  [name] PARTIAL GASTRECTOMY  1982   subtotal; "took out 30% for ulcers"  . TRANSTHORACIC ECHOCARDIOGRAM  02/16/2014   EF 60%, no RWMA. - otherwise normal  . YAG LASER APPLICATION Bilateral        Home Medications    Prior to Admission medications   Medication Sig Start Date End Date Taking? Authorizing Provider  acetaminophen (TYLENOL) 650 MG CR tablet Take 650 mg by mouth 2 (two) times daily as needed for pain.    Historical Provider, MD  aspirin EC 81 MG tablet Take 81 mg by mouth daily.    Historical Provider, MD  diltiazem (CARDIZEM CD) 120 MG 24 hr capsule Take 1 capsule (120 mg total) by mouth daily. 03/19/15   Erlene Quan, PA-C  ferrous sulfate 325 (65 FE) MG tablet Take 325 mg by mouth daily with breakfast.    Historical Provider, MD  flecainide (TAMBOCOR) 150 MG tablet TAKE 1 AND 1/2 TABLETS TWICE DAILY 12/20/15   Leonie Man, MD  HYDROcodone-acetaminophen (NORCO/VICODIN) 5-325 MG tablet Take 0.5 tablets by mouth every 4 (four) hours as needed for severe pain. [id number]   Delora Fuel, MD  HYDROcodone-homatropine [facility]) 5-1.5 MG/5ML syrup Take 5 mLs by mouth every 6 (six) hours as needed for cough. 02/23/16   Charlesetta Shanks, MD  hydrocortisone cream 1 % Apply 1 application topically 2 (two) times daily as needed for  itching.  04/27/15   Historical Provider, MD  Insulin Glargine (LANTUS) 100 UNIT/ML Solostar Pen Inject 2 Units into the skin 2 (two) times daily.     Historical Provider, MD  loratadine (CLARITIN) 10 MG tablet Take 10 mg by mouth every evening.     Historical Provider, MD  metFORMIN (GLUCOPHAGE) 1000 MG tablet Take 1,000 mg by mouth 2 (two) times daily with a meal.     Historical Provider, MD  metoprolol succinate (TOPROL-XL) 25 MG 24 hr tablet Take 0.5 tablets (12.5 mg total) by mouth every evening. 07/06/15   Leonie Man, MD  Multiple Vitamin (MULTIVITAMIN WITH MINERALS) TABS tablet Take 1 tablet by mouth every evening.     Historical Provider, MD  nitroGLYCERIN (NITROSTAT) 0.4 MG SL tablet  Place 1 tablet (0.4 mg total) under the tongue every 5 (five) minutes x 3 doses as needed for chest pain. 11/19/15   Erlene Quan, PA-C  pravastatin (PRAVACHOL) 40 MG tablet Take 40 mg by mouth daily.    Historical Provider, MD  tamsulosin (FLOMAX) 0.4 MG CAPS capsule Take 0.4 mg by mouth every morning. 04/19/15   Historical Provider, MD  warfarin (COUMADIN) 5 MG tablet TAKE 1 TO 1 AND 1/2 TABLETS DAILY AS DIRECTED 12/20/15   Leonie Man, MD    Family History Family History  Problem Relation Age of Onset  . Cancer Mother   . Heart attack Father   . Heart attack Brother     Social History Social History  Substance Use Topics  . Smoking status: Current Every Day Smoker    Packs/day: 0.50    Years: 55.00    Types: Cigarettes  . Smokeless tobacco: Never Used     Comment: He's been smoking between 0.5 and 2 ppd since age 9.  [name] Alcohol use No     Comment: "quit drinking in 1986"     Allergies   Patient has no known allergies.   Review of Systems Review of Systems  10 Systems reviewed and are negative for acute change except as noted in the HPI.  Physical Exam Updated Vital Signs BP 111/56 (BP Location: Right Arm)   Pulse 72   Temp 98.6 F (37 C) (Oral)   Resp 20   SpO2 98%    Physical Exam  Constitutional: He is oriented to person, place, and time. He appears well-developed and well-nourished.  HENT:  Head: Normocephalic and atraumatic.  Eyes: Conjunctivae and EOM are normal.  Neck: Neck supple.  Cardiovascular: Normal rate and regular rhythm.   No murmur heard. Pulmonary/Chest: Effort normal and breath sounds normal. No respiratory distress. He exhibits tenderness.  With compression to lower chest wall bilaterally patient endorses discomfort. He reports this is consistent with the area he experiences pain. Chest walls normal without rashes, crepitus  Abdominal: Soft. There is no tenderness.  Musculoskeletal: He exhibits no edema or tenderness.  Neurological: He is alert and oriented to person, place, and time. He exhibits normal muscle tone. Coordination normal.  Skin: Skin is warm and dry.  Psychiatric: He has a normal mood and affect.  Nursing note and vitals reviewed.    ED Treatments / Results  Labs (all labs ordered are listed, but only abnormal results are displayed) Labs Reviewed  BASIC METABOLIC PANEL - Abnormal; Notable for the following:       Result Value   CO2 21 (*)    Glucose, Bld 146 (*)    All other components within normal limits  CBC - Abnormal; Notable for the following:    RBC 3.79 (*)    Hemoglobin 9.6 (*)    HCT 31.0 (*)    MCH 25.3 (*)    All other components within normal limits  PROTIME-INR - Abnormal; Notable for the following:    Prothrombin Time 22.5 (*)    All other components within normal limits  I-STAT TROPOININ, ED    EKG  EKG Interpretation  Date/Time:  Wednesday February 23 2016 12:02:06 EST Ventricular Rate:  89 PR Interval:  282 QRS Duration: 100 QT Interval:  378 QTC Calculation: 459 R Axis:   -14 Text Interpretation:  Sinus rhythm with 1st degree A-V block with occasional Premature ventricular complexes Otherwise normal ECG No significant change since last tracing Confirmed by FLOYD  MD, Quillian Quince  [phone]) on 02/23/2016 12:05:50 PM       Radiology Dg Chest 2 View  Result Date: 02/23/2016 CLINICAL DATA:  Upper respiratory tract infection. EXAM: CHEST  2 VIEW COMPARISON:  05/15/2015 FINDINGS: Heart size is normal. No pleural or pericardial effusion. Coarsened interstitial markings are identified bilaterally. Multi level degenerative disc disease identified throughout the thoracic spine. No superimposed airspace consolidation. IMPRESSION: 1. No acute cardiopulmonary abnormalities. 2. Chronic interstitial coarsening. Electronically Signed   By: Kerby Moors M.D.   On: 02/23/2016 12:27    Procedures Procedures (including critical care time)  Medications Ordered in ED Medications  albuterol (PROVENTIL HFA;VENTOLIN HFA) 108 (90 Base) MCG/ACT inhaler 2 puff (not administered)  HYDROcodone-acetaminophen (NORCO/VICODIN) 5-325 MG per tablet 1 tablet (not administered)     Initial Impression / Assessment and Plan / ED Course  I have reviewed the triage vital signs and the nursing notes.  Pertinent labs & imaging results that were available during my care of the patient were reviewed by me and considered in my medical decision making (see chart for details).  Clinical Course     Final Clinical Impressions(s) / ED Diagnoses   Final diagnoses:  Bronchitis  Chest wall pain  Patient is clinically well appearance. He does not have any respiratory distress. Sounds are clear at this time. Patient does have history of tobacco use and ongoing half pack per day use. Findings are consistent with bronchitis with chest wall discomfort from coughing. At this time, patient will be treated symptomatically as there is no clinical or x-ray evidence of active pneumonia. Patient reports he had a significantly adverse reaction to an antibiotic in the past whereby he became partially paralyzed for a while. Inhaler dispensed from the emergency department and prescription for Hycodan. Patient is counseled on  signs and symptoms worsen or return and reports he has a PCP for follow-up.  New Prescriptions New Prescriptions   HYDROCODONE-HOMATROPINE (HYCODAN) 5-1.5 MG/5ML SYRUP    Take 5 mLs by mouth every 6 (six) hours as needed for cough.     Charlesetta Shanks, MD 02/23/16 [phone]

## 2016-02-23 NOTE — ED Triage Notes (Signed)
Pt has multiple complaints. Reports cough x 3 weeks. Has pain to entire torso. Pain to chest, back, shoulders, arms and abd. Reports sob and wheezing.

## 2016-02-29 IMAGING — CR DG CHEST 2V
2 series · 2 of 2 positions shown · non-contrast
Comparison: 10/30/2014

CLINICAL DATA: Productive cough and shortness of breath for 2 weeks

EXAM:
CHEST - 2 VIEW

[chest pa]
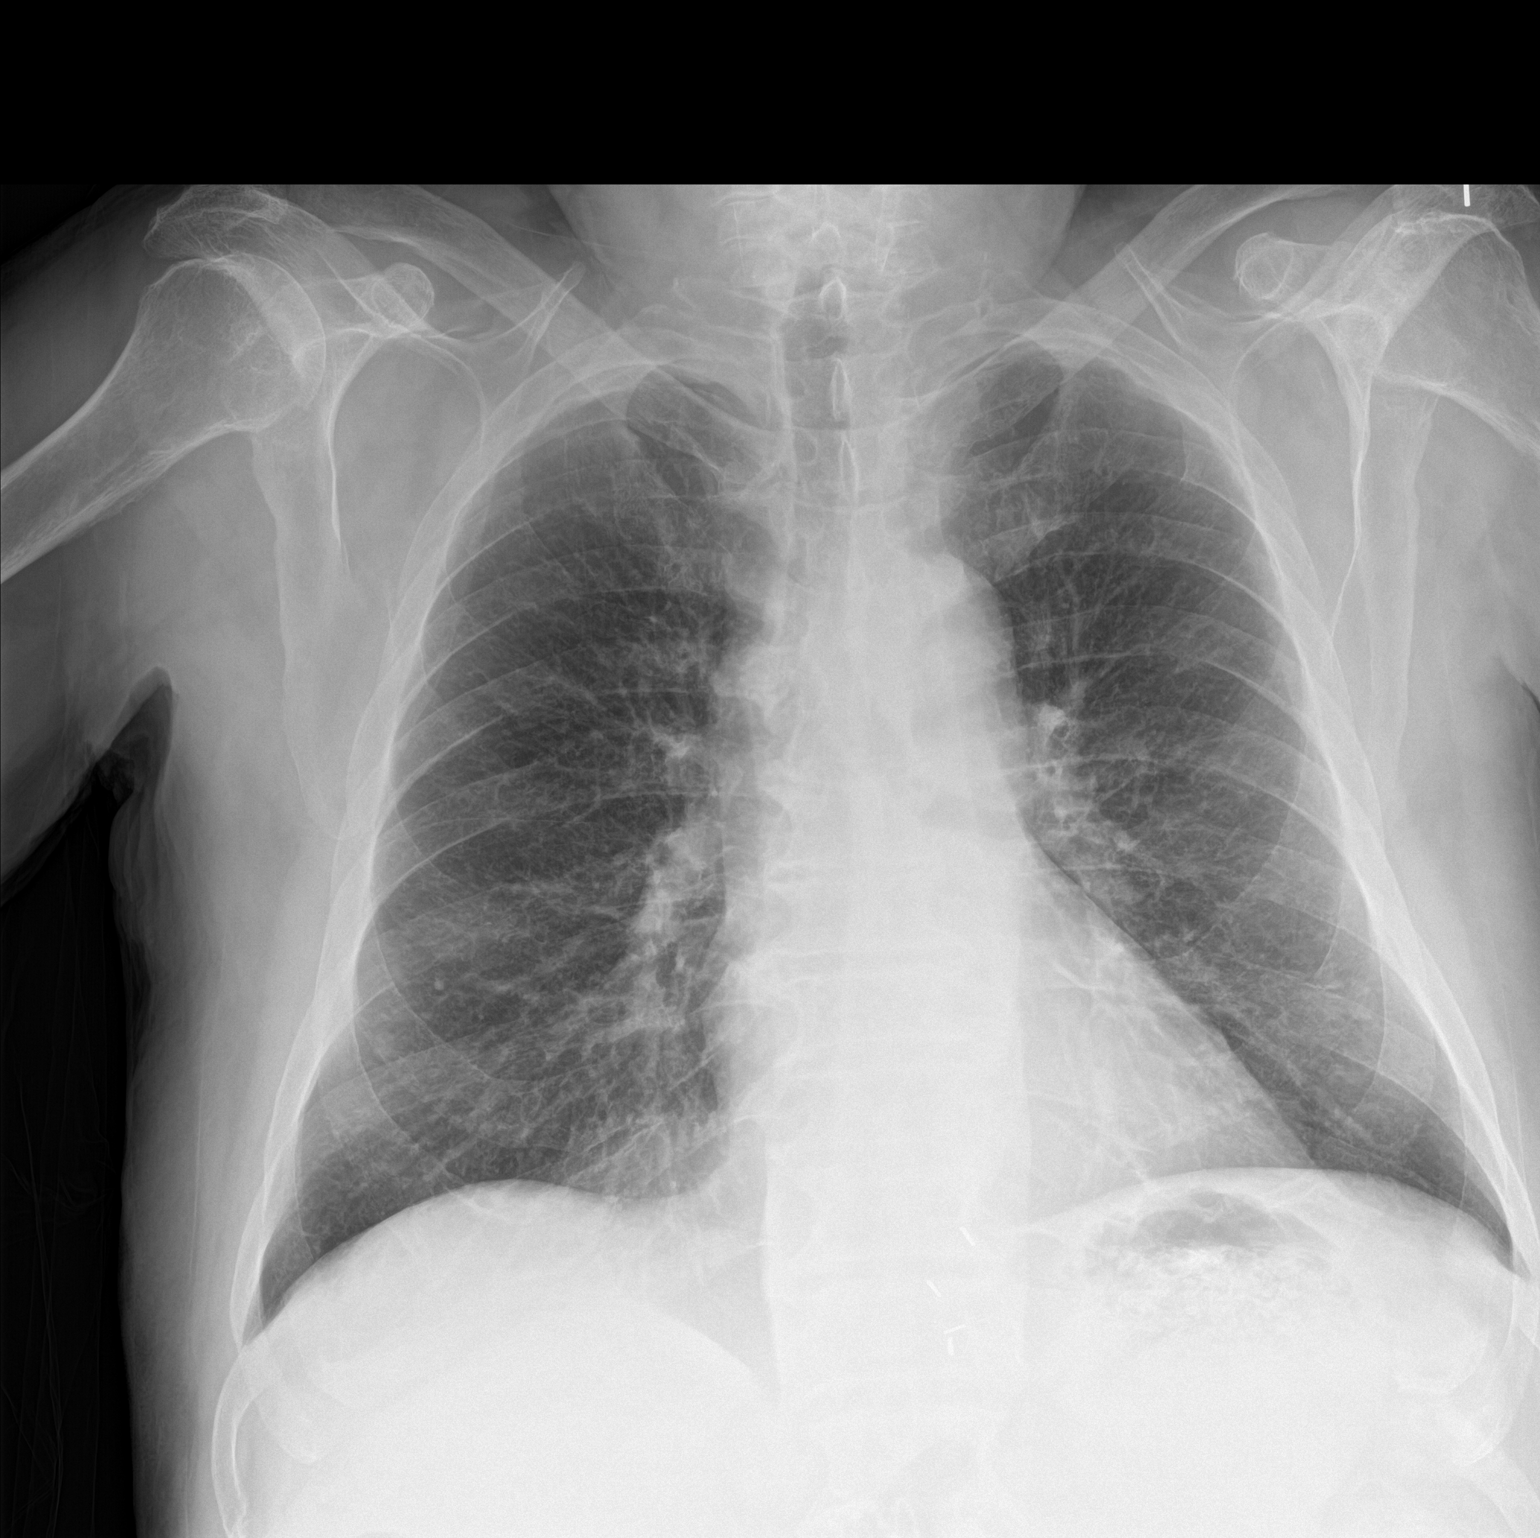

[chest lat]
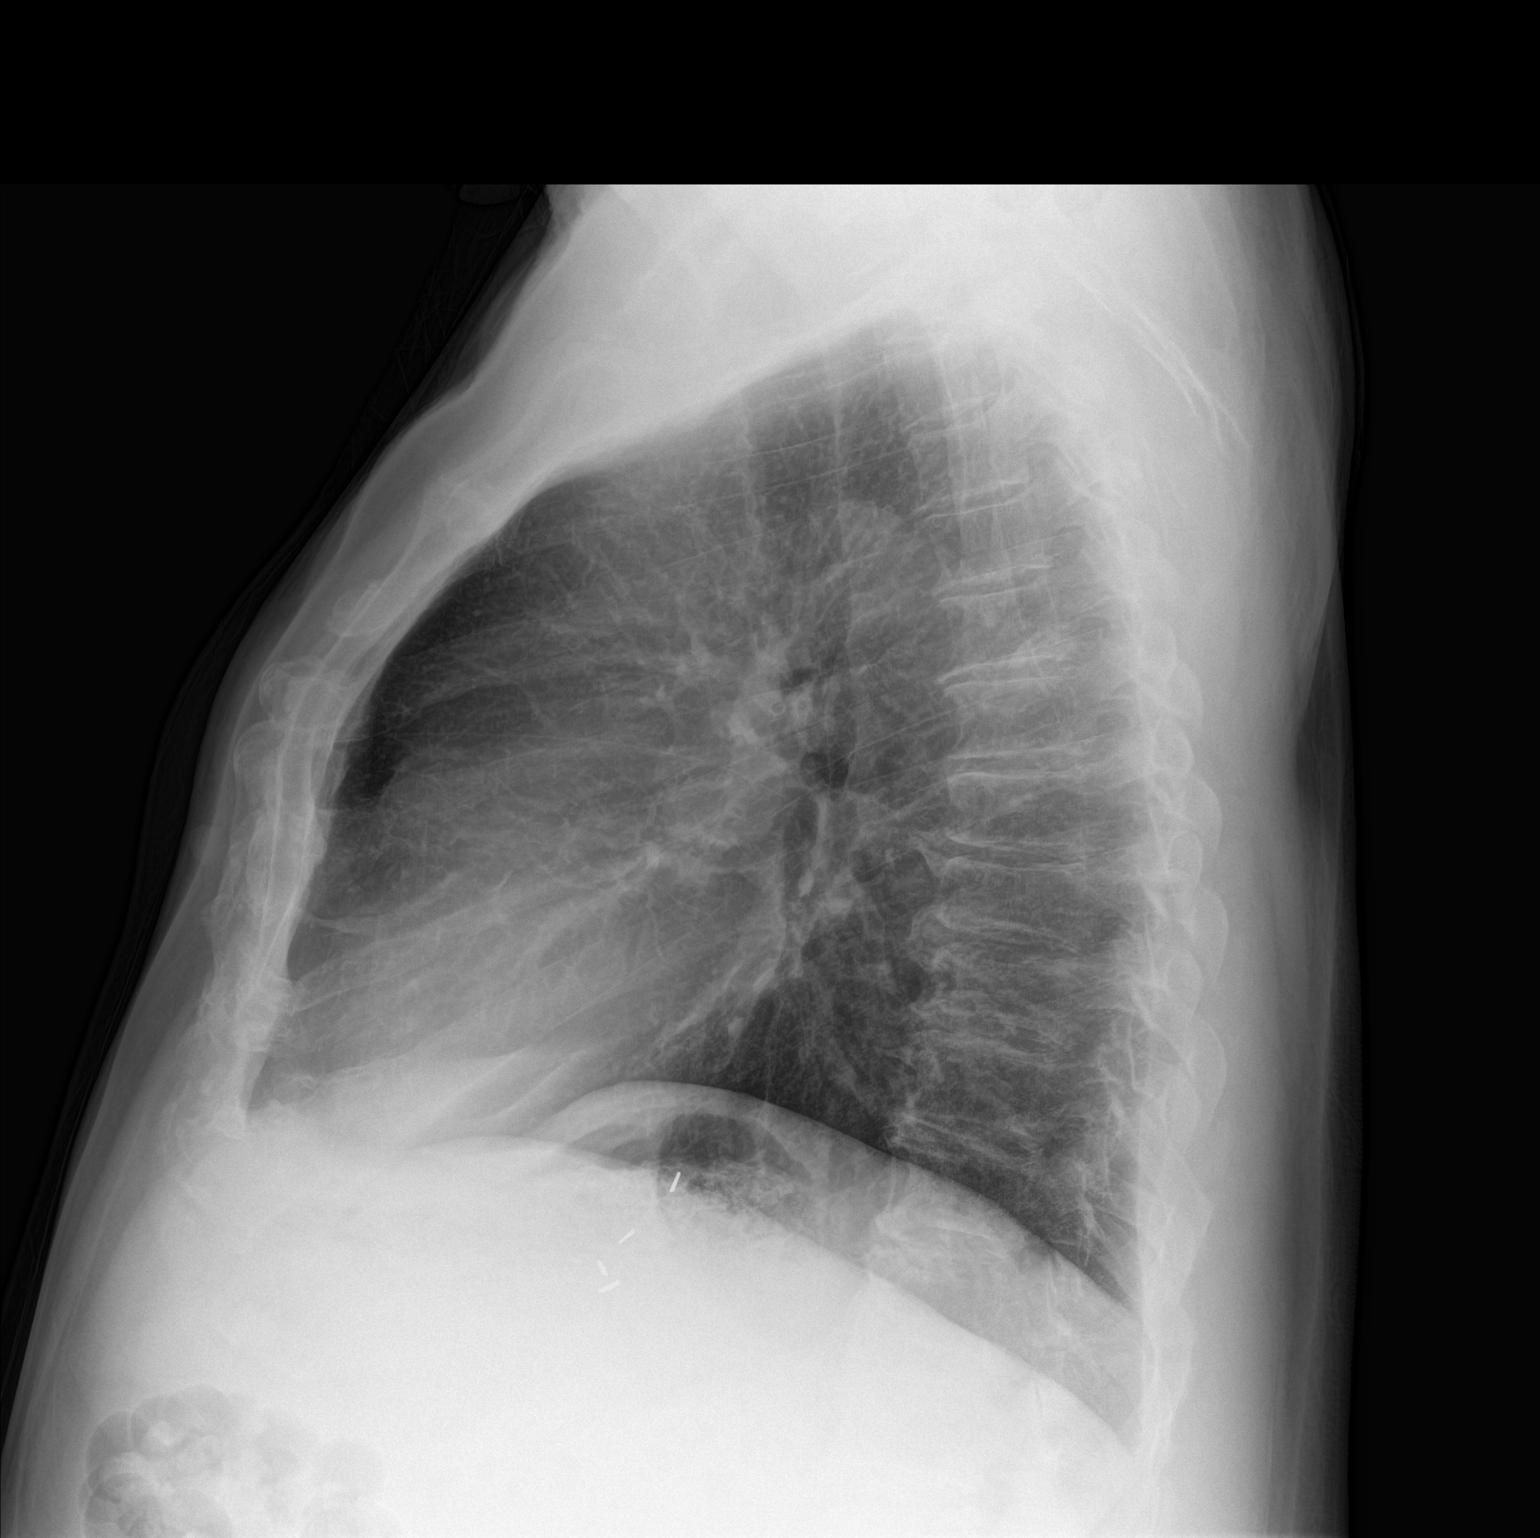

[2 of 2 positions shown; findings below may reference images not displayed]

FINDINGS: Cardiac shadow is within normal limits. The lungs are well aerated
bilaterally. Bilateral nipple shadows are again noted. No focal
infiltrate or sizable effusion is seen. Degenerative changes of the
thoracic spine are noted.
IMPRESSION: No active disease.

## 2016-03-14 ENCOUNTER — Other Ambulatory Visit: Payer: Self-pay | Admitting: *Deleted

## 2016-03-14 ENCOUNTER — Other Ambulatory Visit: Payer: Self-pay | Admitting: Cardiovascular Disease

## 2016-03-14 MED ORDER — WARFARIN SODIUM 5 MG PO TABS: ORAL_TABLET | ORAL | 1 refills | Status: DC

## 2016-03-15 ENCOUNTER — Other Ambulatory Visit: Payer: Self-pay | Admitting: Cardiology

## 2016-03-24 ENCOUNTER — Ambulatory Visit (INDEPENDENT_AMBULATORY_CARE_PROVIDER_SITE_OTHER): Payer: Medicare Other | Admitting: Pharmacist

## 2016-03-24 DIAGNOSIS — I2510 Atherosclerotic heart disease of native coronary artery without angina pectoris: Secondary | ICD-10-CM

## 2016-03-24 DIAGNOSIS — Z7901 Long term (current) use of anticoagulants: Secondary | ICD-10-CM | POA: Diagnosis not present

## 2016-03-24 DIAGNOSIS — I480 Paroxysmal atrial fibrillation: Secondary | ICD-10-CM | POA: Diagnosis not present

## 2016-03-24 LAB — POCT INR: INR: 2.3

## 2016-03-24 MED ORDER — WARFARIN SODIUM 5 MG PO TABS: ORAL_TABLET | ORAL | 1 refills | Status: DC

## 2016-03-29 IMAGING — MR MR CERVICAL SPINE W/O CM
6 of 20 series · 16 of 48 positions shown · non-contrast
Comparison: CT of the cervical spine 10/09/2014.

CLINICAL DATA: Progressive neck and low back pain. Known cervical
disc disease. Weakness in both legs.

EXAM:
MRI CERVICAL, THORACIC AND LUMBAR SPINE WITHOUT CONTRAST
TECHNIQUE: Multiplanar and multiecho pulse sequences of the cervical spine, to
include the craniocervical junction and cervicothoracic junction,
and thoracic and lumbar spine, were obtained without intravenous
contrast.

[Series 27: T2 · sagittal · 3.0mm · 0.43mm/px · 2 of 16 slices shown (1 of 6)]
[im 1/16]
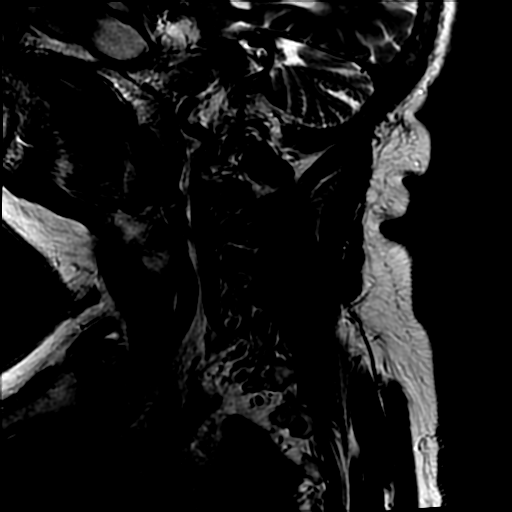
[im 16/16]
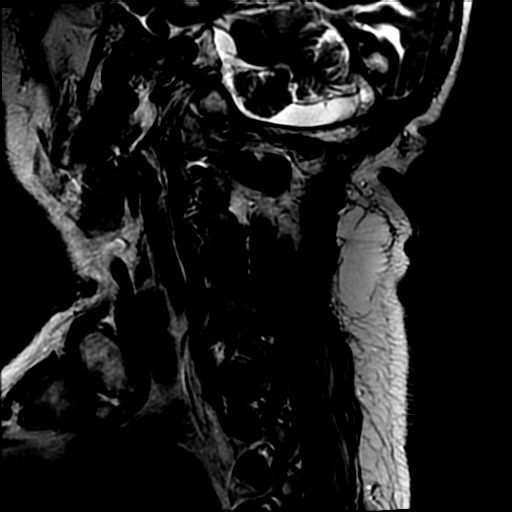

[Series 28: T2 · sagittal · 3.0mm · 0.43mm/px · 2 of 16 slices shown (2 of 6)]
[im 1/16]
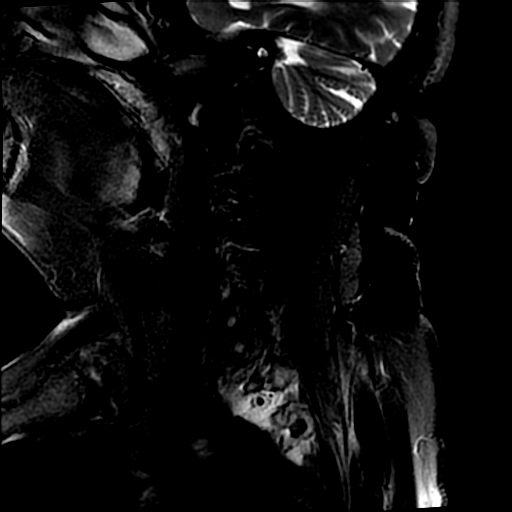
[im 16/16]
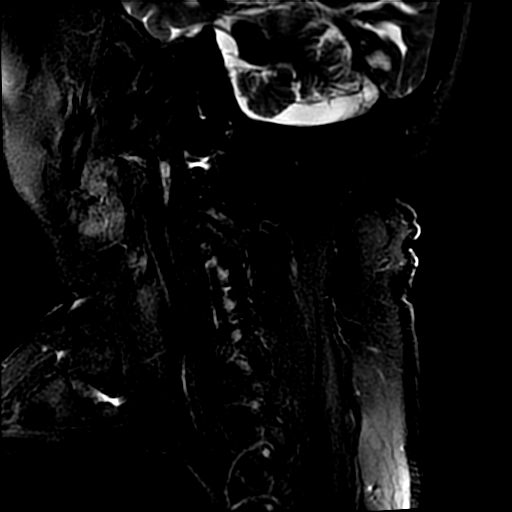

[Series 29: T2 · axial · 3.0mm · 0.35mm/px · z∈[+140,+229]mm · 3 of 29 slices shown (3 of 6)]
[im 1/29]
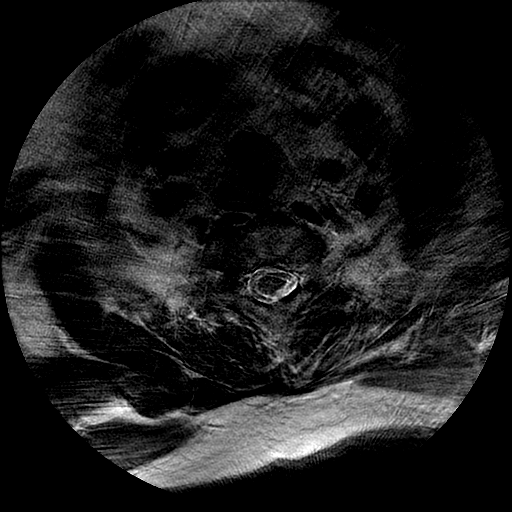
[im 15/29]
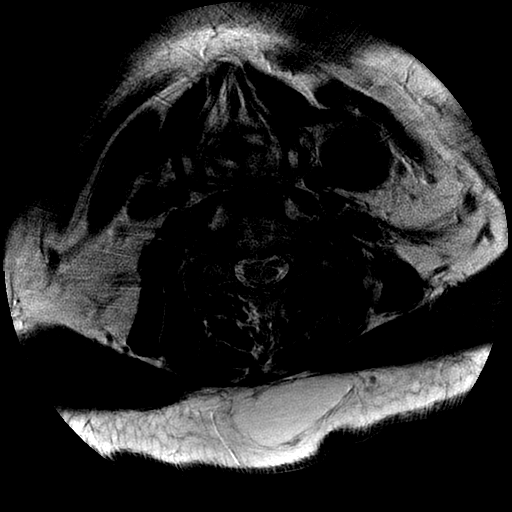
[im 29/29]
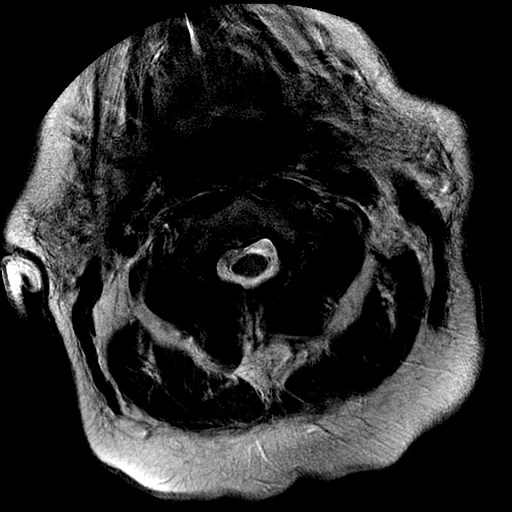

[Series 400: T2 · sagittal · 4.0mm · 0.55mm/px · 2 of 14 slices shown (4 of 6)]
[im 1/14]
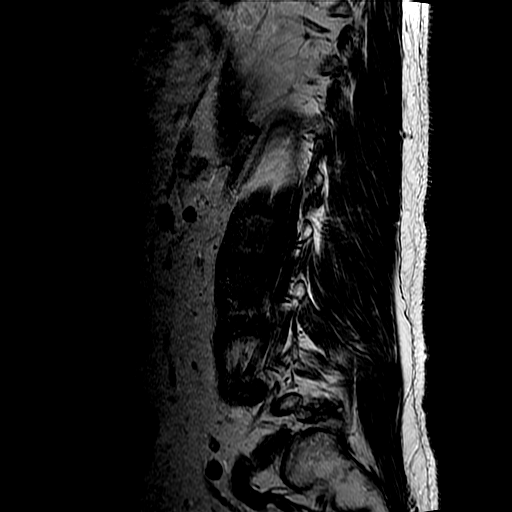
[im 14/14]
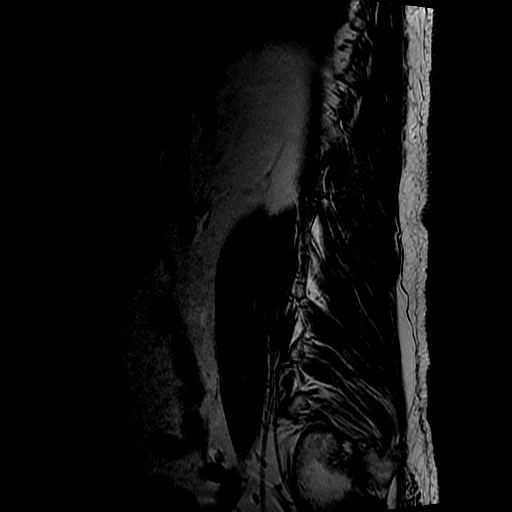

[Series 900: T2 · axial · 4.0mm · 0.39mm/px · z∈[-10,+210]mm · 5 of 40 slices shown (5 of 6)]
[im 1/40]
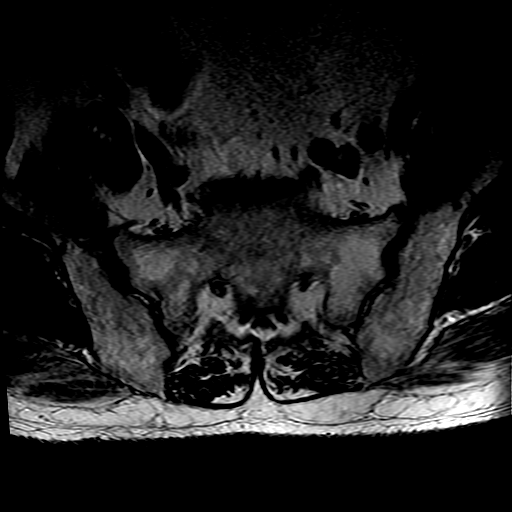
[im 10/40]
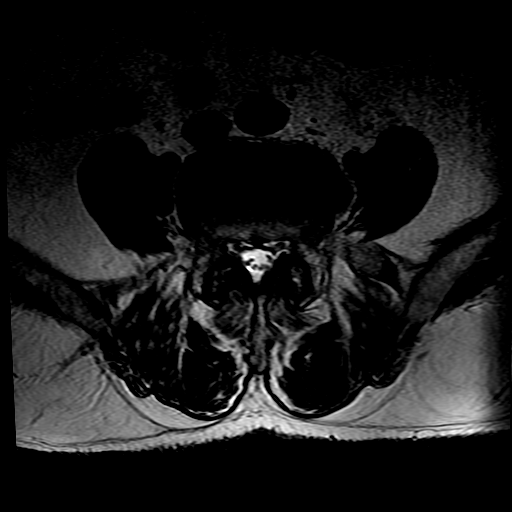
[im 20/40]
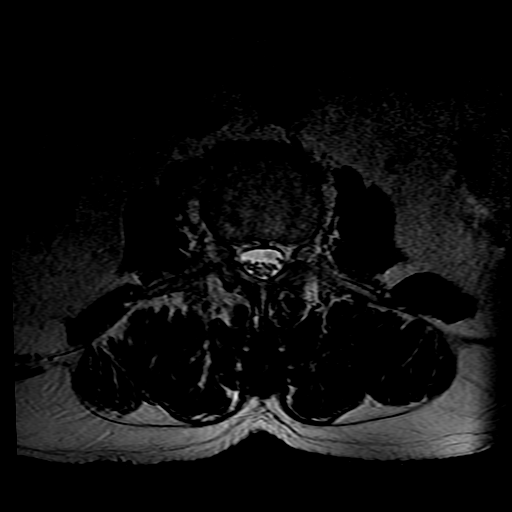
[im 30/40]
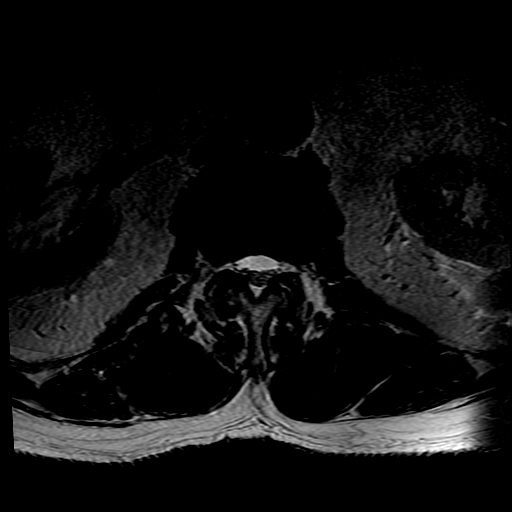
[im 40/40]
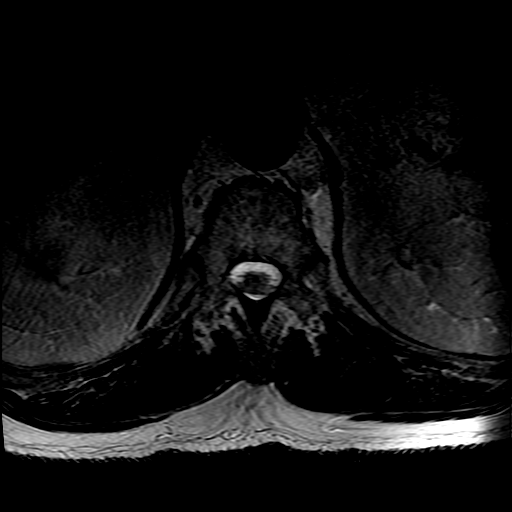

[Series 1100: T2 · sagittal · 3.0mm · 0.66mm/px · 2 of 16 slices shown (6 of 6)]
[im 1/16]
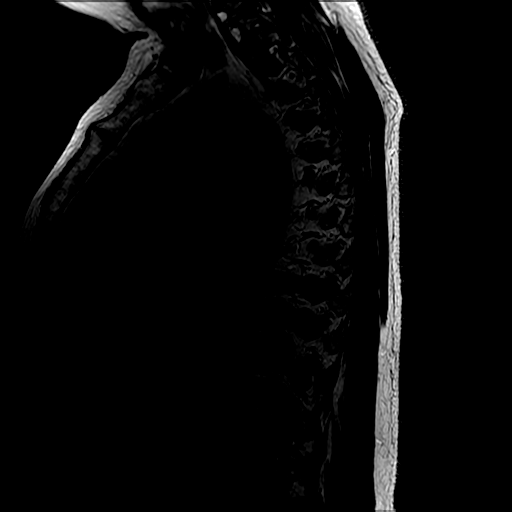
[im 16/16]
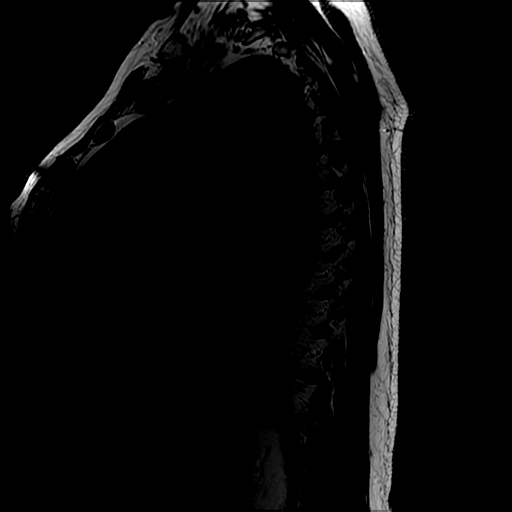

[16 of 48 positions shown; findings below may reference images not displayed]

FINDINGS: MRI CERVICAL SPINE FINDINGS

Normal signal is present in the cervical and upper thoracic spinal
cord. Mild endplate marrow changes are evident at C4-5, C5-6, and
C6-7. Vertebral body heights are maintained. Alignment is anatomic.
There straightening of the normal cervical lordosis.

Craniocervical junction is within normal limits. The visualized
intracranial contents are normal.

C2-3: A rightward disc osteophyte complex and asymmetric right-sided
facet hypertrophy results in severe right foraminal narrowing. There
is moderate right central canal stenosis.

C3-4: A broad-based disc osteophyte complex is present. Moderate
facet hypertrophy is worse on the left. This results in severe left
and moderate right foraminal narrowing. There is effacement of the
ventral CSF.

C4-5: A broad-based disc osteophyte complex is present.
Uncovertebral spurring and facet hypertrophy results in moderate
right and mild left foraminal narrowing.

C5-6: A broad-based disc osteophyte complex effaces ventral CSF and
contacts the ventral surface of the cord. Moderate central canal
stenosis is present. Moderate foraminal narrowing is worse on the
left.

C6-7: A broad-based disc osteophyte complex is asymmetric to the
right. There is partial effacement of ventral CSF. Uncovertebral
spurring results in severe right and moderate left foraminal
stenosis.

C7-T1: A broad-based disc osteophyte complex is present.
Uncovertebral spurring results in moderate foraminal narrowing, left
greater than right.

MRI THORACIC SPINE FINDINGS

Normal signal is present throughout the thoracic spinal cord. A
hemangioma is seen posteriorly on the right at [place] T6. Marrow
signal, vertebral body heights, alignment are otherwise normal.

The disc levels at T7-8 above are normal

Facet hypertrophy contributes to foraminal narrowing at at T8-9,
right greater than left. Left foraminal narrowing is present T9-10
due to facet hypertrophy. The lower thoracic spine is unremarkable.

The paraspinous soft tissues are within normal limits.

MRI LUMBAR SPINE FINDINGS

Normal signal is present in the conus medullaris which terminates at
L1-2, within normal limits. There is diffuse fatty infiltration of
the marrow. Vertebral body heights alignment are maintained.

Limited imaging the abdomen is unremarkable. No significant
adenopathy is present.

L1-2: Asymmetric right-sided facet hypertrophy is present. There is
no significant disc protrusion or stenosis.

L2-3: A broad-based disc protrusion is asymmetric to the right.
Moderate facet hypertrophy is noted. This results in moderate right
and mild left subarticular narrowing. Mild foraminal narrowing is
present bilaterally.

L3-4: A mild broad-based disc bulge is present. Facet hypertrophy
contributes to mild right foraminal stenosis.

L4-5: A broad-based leftward disc protrusion is present. Mild
subarticular narrowing is worse on the left. Moderate facet
hypertrophy contributes to mild left foraminal narrowing.

L5-S1: Advanced facet hypertrophy is worse on the right. There is no
significant disc protrusion. Mild right foraminal stenosis is noted.
IMPRESSION: 1. Multilevel moderate to severe spondylosis of the cervical spine
as described.
2. Severe right foraminal and moderate right central canal stenosis
at C2-3.
3. Severe left and moderate right foraminal narrowing at C3-4.
4. Moderate right and mild left foraminal narrowing at C4-5.
5. Moderate central and bilateral foraminal narrowing at C5-6, worse
on the left.
6. Severe right and moderate left foraminal narrowing at C6-7.
7. Moderate foraminal narrowing at C7-T1 is worse on the left.
8. Foraminal narrowing at T8-9 and T9-10 due to facet hypertrophy.
9. The thoracic spine is otherwise within normal limits.
10. Moderate right and mild left subarticular narrowing at L2-3
secondary to a rightward disc protrusion and advanced facet
hypertrophy.
11. Moderate to severe facet hypertrophy throughout the lumbar spine
with relatively short pedicles.
12. Mild right foraminal narrowing at L3-4.
13. Mild subarticular narrowing at L4-5 is worse on the left. There
is also mild left foraminal narrowing.
14. Mild right foraminal narrowing at L5-S1 secondary to facet
hypertrophy.

## 2016-04-07 DIAGNOSIS — H02831 Dermatochalasis of right upper eyelid: Secondary | ICD-10-CM | POA: Diagnosis not present

## 2016-04-07 DIAGNOSIS — H02422 Myogenic ptosis of left eyelid: Secondary | ICD-10-CM | POA: Diagnosis not present

## 2016-04-07 DIAGNOSIS — H02834 Dermatochalasis of left upper eyelid: Secondary | ICD-10-CM | POA: Diagnosis not present

## 2016-04-07 DIAGNOSIS — H02412 Mechanical ptosis of left eyelid: Secondary | ICD-10-CM | POA: Diagnosis not present

## 2016-04-25 ENCOUNTER — Other Ambulatory Visit: Payer: Self-pay | Admitting: Cardiology

## 2016-04-25 DIAGNOSIS — Z794 Long term (current) use of insulin: Secondary | ICD-10-CM | POA: Diagnosis not present

## 2016-04-25 DIAGNOSIS — M542 Cervicalgia: Secondary | ICD-10-CM | POA: Diagnosis not present

## 2016-04-25 DIAGNOSIS — I10 Essential (primary) hypertension: Secondary | ICD-10-CM | POA: Diagnosis not present

## 2016-04-25 DIAGNOSIS — E119 Type 2 diabetes mellitus without complications: Secondary | ICD-10-CM | POA: Diagnosis not present

## 2016-04-25 DIAGNOSIS — Z0000 Encounter for general adult medical examination without abnormal findings: Secondary | ICD-10-CM | POA: Diagnosis not present

## 2016-04-25 DIAGNOSIS — E785 Hyperlipidemia, unspecified: Secondary | ICD-10-CM | POA: Diagnosis not present

## 2016-04-25 DIAGNOSIS — I480 Paroxysmal atrial fibrillation: Secondary | ICD-10-CM | POA: Diagnosis not present

## 2016-04-25 NOTE — Telephone Encounter (Signed)
Rx(s) sent to pharmacy electronically.  

## 2016-04-28 ENCOUNTER — Other Ambulatory Visit: Payer: Self-pay | Admitting: Cardiology

## 2016-05-05 ENCOUNTER — Ambulatory Visit (INDEPENDENT_AMBULATORY_CARE_PROVIDER_SITE_OTHER): Payer: Medicare Other | Admitting: Pharmacist

## 2016-05-05 DIAGNOSIS — I480 Paroxysmal atrial fibrillation: Secondary | ICD-10-CM | POA: Diagnosis not present

## 2016-05-05 DIAGNOSIS — Z7901 Long term (current) use of anticoagulants: Secondary | ICD-10-CM | POA: Diagnosis not present

## 2016-05-05 LAB — POCT INR: INR: 2.5

## 2016-05-20 IMAGING — DX DG CHEST 2V
2 series · 2 of 2 positions shown · non-contrast
Comparison: December 19, 2014.

CLINICAL DATA: Chest pain and shortness of breath for 3 days.

EXAM:
CHEST  2 VIEW

[w chest pa]
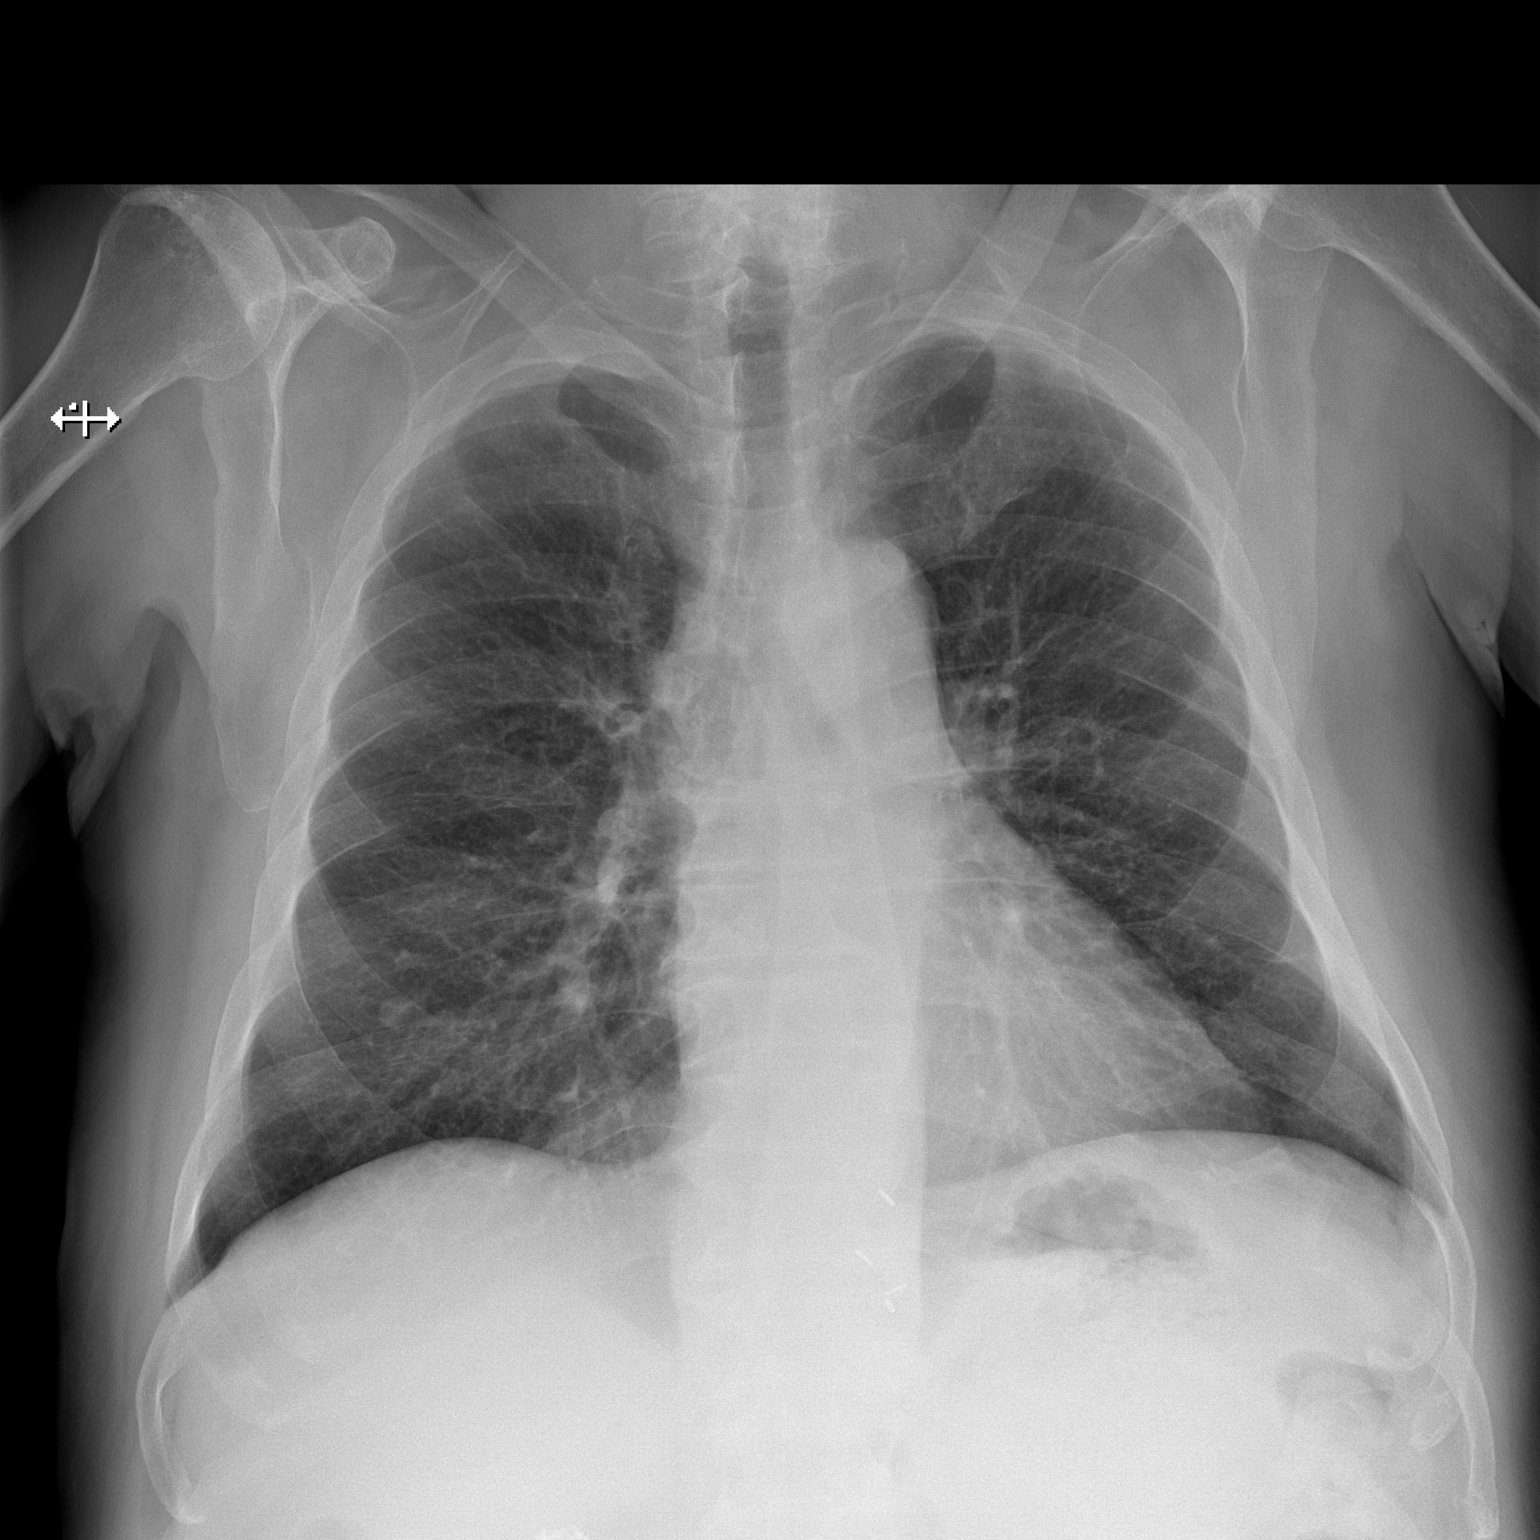

[w chest lat]
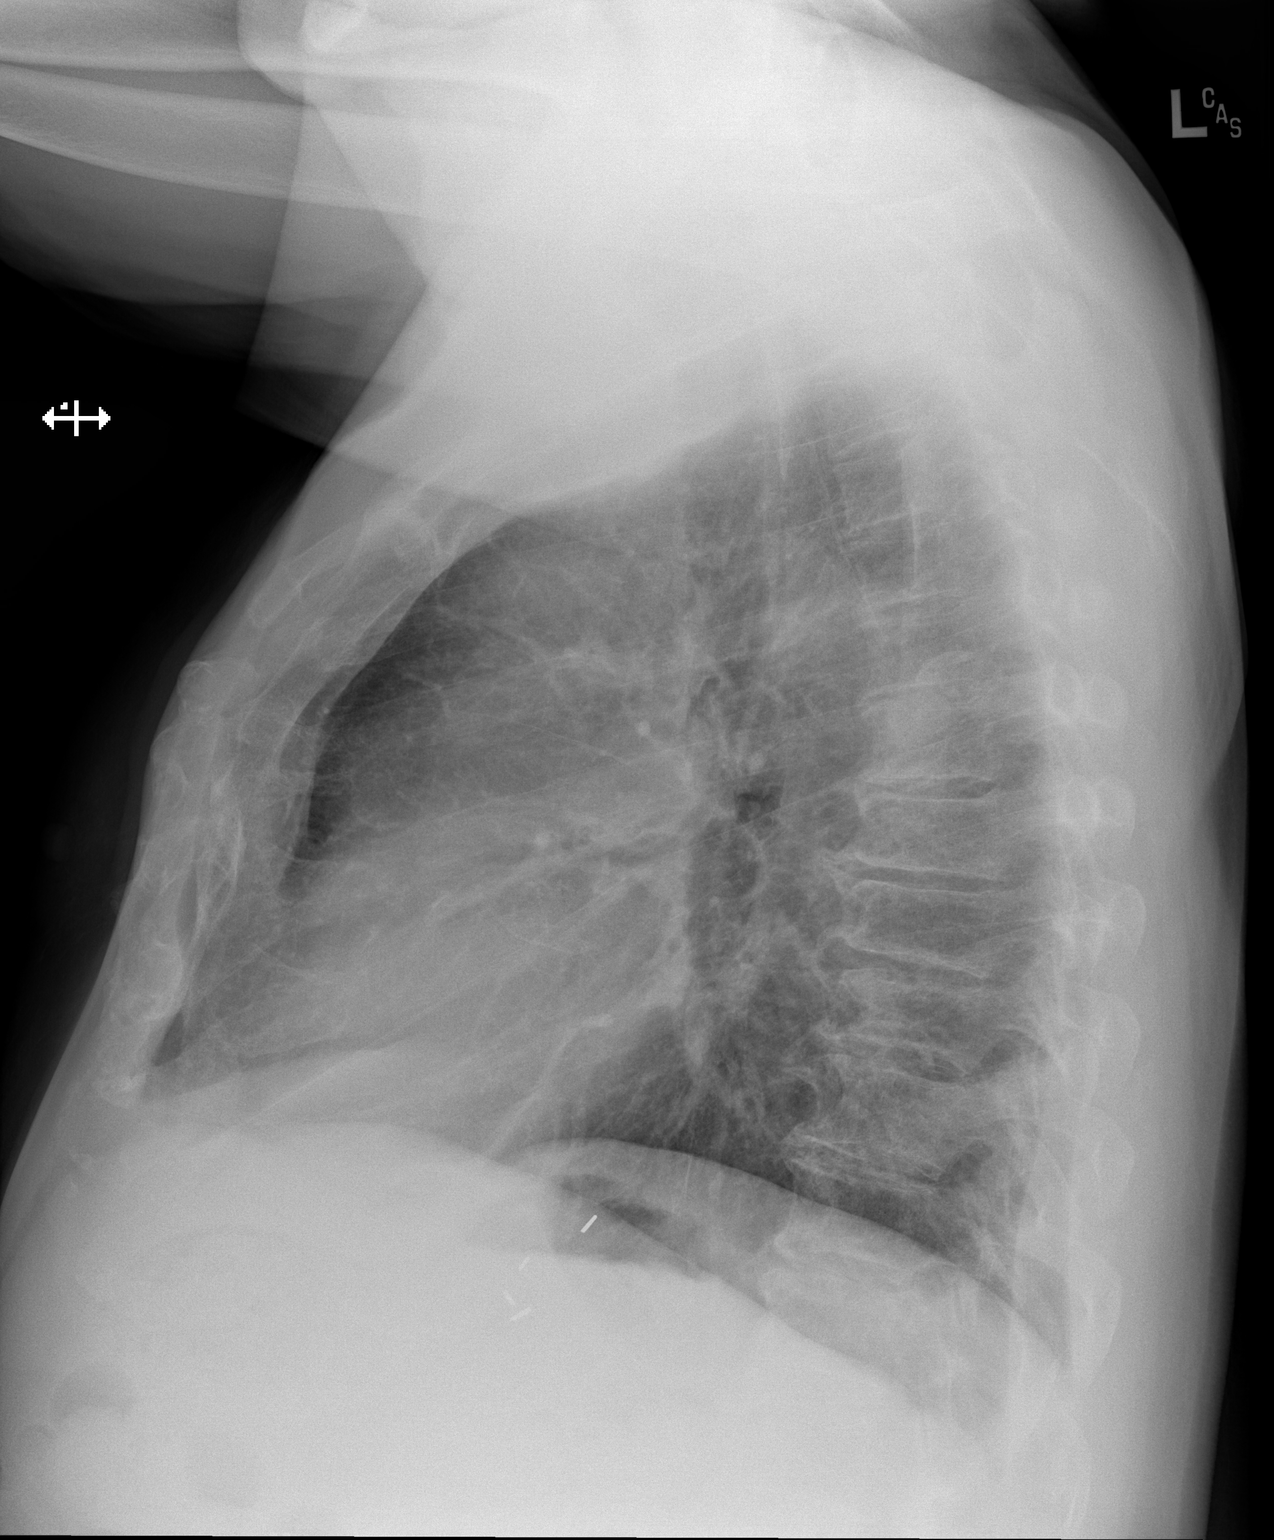

[2 of 2 positions shown; findings below may reference images not displayed]

FINDINGS: The heart size and mediastinal contours are within normal limits.
Both lungs are clear. No pneumothorax or pleural effusion is noted.
Multilevel degenerative disc disease is noted in the thoracic spine.
IMPRESSION: No active cardiopulmonary disease.

## 2016-05-26 ENCOUNTER — Encounter: Payer: Self-pay | Admitting: Cardiology

## 2016-05-26 ENCOUNTER — Ambulatory Visit (INDEPENDENT_AMBULATORY_CARE_PROVIDER_SITE_OTHER): Payer: Medicare Other | Admitting: Cardiology

## 2016-05-26 VITALS — BP 124/52 | HR 88 | Ht 66.0 in | Wt 188.0 lb

## 2016-05-26 DIAGNOSIS — I10 Essential (primary) hypertension: Secondary | ICD-10-CM | POA: Diagnosis not present

## 2016-05-26 DIAGNOSIS — Z716 Tobacco abuse counseling: Secondary | ICD-10-CM

## 2016-05-26 DIAGNOSIS — I480 Paroxysmal atrial fibrillation: Secondary | ICD-10-CM | POA: Diagnosis not present

## 2016-05-26 DIAGNOSIS — Z7901 Long term (current) use of anticoagulants: Secondary | ICD-10-CM | POA: Diagnosis not present

## 2016-05-26 DIAGNOSIS — Z5181 Encounter for therapeutic drug level monitoring: Secondary | ICD-10-CM

## 2016-05-26 DIAGNOSIS — I5032 Chronic diastolic (congestive) heart failure: Secondary | ICD-10-CM | POA: Diagnosis not present

## 2016-05-26 DIAGNOSIS — I2510 Atherosclerotic heart disease of native coronary artery without angina pectoris: Secondary | ICD-10-CM | POA: Diagnosis not present

## 2016-05-26 DIAGNOSIS — R55 Syncope and collapse: Secondary | ICD-10-CM

## 2016-05-26 NOTE — Patient Instructions (Addendum)
No changes with current medications   Okay to have surgery  It will be okay to do home INR testing after surgery. Will discuss with pharmacist.   Your physician wants you to follow-up in 6 months with Trevor Bayley, [name].  You will receive a reminder letter in the mail two months in advance. If you don't receive a letter, please call our office to schedule the follow-up appointment.  Your physician wants you to follow-up in: 12 months with [name]. You will receive a reminder letter in the mail two months in advance. If you don't receive a letter, please call our office to schedule the follow-up appointment.

## 2016-05-26 NOTE — Progress Notes (Signed)
PCP: [name], MD  Clinic Note: Chief Complaint  Patient presents with  . Follow-up    Need surgical clearance for eye.  . Atrial Fibrillation    Status post atrial flutter ablation. Rhythm control with flecainide    HPI: [name] is a 76 y.o. male with a PMH below who presents today for Six-month follow-up for paroxysmal A. fib/flutter. He has had several cardioversions along with atrial flutter. Cardiac catheterization was performed because his initial EKG when I first saw him suggested possible STEMI. He did not have any significant CAD. He has been relatively well-controlled in sinus rhythm with moderate dose flecainide. He has been on warfarin for endocrine relation mostly based on his desire to stay "old school ".  [name] was last seen in August 2016 by Kerin Ransom, PA. I last saw him in April 2017.  Recent Hospitalizations: n/a  Studies Reviewed: n/a  Interval History: [name] returns today with no major cardiac complaints. The only thing he really knows today is that his back pain isn't bothering him, and he needs to have eye surgery. He is not had any issues at all with symptoms to suggest recurrence of his A. fib or flutter. No rapid, irregular heartbeats. No sensation of palpitations. As such, he is not had any chest tightness or pressure with rest or exertion. He does have some baseline exertional dyspnea and is not very active because of his back. His INR levels been very stable and has not had any bleeding issues on warfarin. Since starting coenzyme Q 10, he's been noticing less muscle aches and cramps in general. They asked if they can increase his dose. Cardiac review of symptoms: No chest pain or shortness of breath with rest or exertion.  No PND, orthopnea or edema. No palpitations, lightheadedness, dizziness, weakness or syncope/near syncope. No TIA/amaurosis fugax symptoms. No melena, hematochezia, hematuria, or epstaxis. No obvious  claudication -but with limited mobility from back pain, he does not walk much.   ROS: A comprehensive was performed. Review of Systems  Constitutional: Negative for malaise/fatigue.  HENT: Positive for hearing loss.   Respiratory: Positive for cough (Sometimes in the morning) and wheezing (Occasionally when allergies act up). Negative for sputum production and shortness of breath.   Musculoskeletal: Positive for back pain and joint pain (Also hip and knee pain). Negative for myalgias (Much better with CoQ10).  Skin: Negative.   Neurological: Positive for dizziness (Sometimes positional).  Endo/Heme/Allergies: Positive for environmental allergies. Bruises/bleeds easily (But tolerable).  Psychiatric/Behavioral: Negative for memory loss. The patient is not nervous/anxious and does not have insomnia.   All other systems reviewed and are negative.   Past Medical History:  Diagnosis Date  . Anemia    takes Ferrous Sulfate daily  . Arthritis    "all over"  . Atrial flutter ([place])    a. 07/2010 Status post caval tricuspid isthmus ablation by Dr. Midge Aver Metoprolol daily  . Balance problem 01/2014  . CAP (community acquired pneumonia) 09/18/2014  . Cervical radiculopathy due to degenerative joint disease of spine   . COPD (chronic obstructive pulmonary disease) ([place])   . Coronary artery disease, non-occlusive    a. 03/2010 Nonocclusive disease by cath, performed for ST elevations on ECG;  b. 06/2013 Lexi MV: EF 60%, no ischemia.  . Diabetes mellitus type II    takes Metformin and Lantus daily  . Diastolic CHF, chronic ([place])    a. 12/2012 EF 55-60%, diast dysfxn, triv MR, mildly dil LA/RA.  [name]  Dysrhythmia    HX OF ATRIAL IFB /FLUTTER takes Flecanide and Coumadin daily  . History of blood transfusion 1982   "when I had stomach OR"  . History of bronchitis    1998  . History of gastric ulcer   . HTN (hypertension)    takes Diltiazem daily  . Hyperlipidemia    takes Pravastatin daily  .  Joint pain   . PAF (paroxysmal atrial fibrillation) ([place])    a. Recurrent after atrial flutter, currently controlled on flecainide plus diltiazem  . Pneumonia 1999  . Weakness    numbness and tingling both hands   This patients CHA2DS2-VASc Score and unadjusted Ischemic Stroke Rate (% per year) is equal to 11.2 % stroke rate/year from a score of 7 Above score calculated as 1 point each if present [CHF, HTN, DM, Vascular=MI/PAD/Aortic Plaque, Age if 65-74, or Male] Above score calculated as 2 points each if present [Age > 75, or Stroke/TIA/TE]   Past Surgical History:  Procedure Laterality Date  . ATRIAL ABLATION SURGERY  08/05/10   CTI ablation for atrial flutter by JA  . CARDIAC CATHETERIZATION  2012   nl LV function, no occlusive CAD, PAF  . CARDIOVERSION  12/07/2010    Successful direct current cardioversion with atrial fibrillation to normal sinus rhythm  . CARPAL TUNNEL RELEASE Bilateral 01/30/2014   Procedure: BILATERAL CARPAL TUNNEL RELEASE;  Surgeon: Marianna Payment, MD;  Location: [facility];  Service: Orthopedics;  Laterality: Bilateral;  . CATARACT EXTRACTION W/ INTRAOCULAR LENS  IMPLANT, BILATERAL Bilateral   . COLONOSCOPY [name] 12/02/2013   Procedure: COLONOSCOPY;  Surgeon: Irene Shipper, MD;  Location: [facility];  Service: Endoscopy;  Laterality: [name];  . ESOPHAGOGASTRODUODENOSCOPY [name] 09/22/2014   Procedure: ESOPHAGOGASTRODUODENOSCOPY (EGD);  Surgeon: Ronald Lobo, MD;  Location: [facility] ENDOSCOPY;  Service: Endoscopy;  Laterality: [name];  . INCISION AND DRAINAGE ABSCESS / HEMATOMA OF BURSA / KNEE / THIGH Left 1998   knee  . KNEE BURSECTOMY Left 1998  . LAPAROSCOPIC CHOLECYSTECTOMY  03/2010  . NM MYOVIEW LTD  07/22/2013   Normal EF ~60%, no ischemia or infarction.  [name] PARTIAL GASTRECTOMY  1982   subtotal; "took out 30% for ulcers"  . TRANSTHORACIC ECHOCARDIOGRAM  02/16/2014   EF 60%, no RWMA. - otherwise normal  . YAG LASER APPLICATION Bilateral     Current Meds  Medication  Sig  . acetaminophen (TYLENOL) 650 MG CR tablet Take 650 mg by mouth 2 (two) times daily as needed for pain.  [name] aspirin EC 81 MG tablet Take 81 mg by mouth daily.  [name] diltiazem (CARDIZEM CD) 120 MG 24 hr capsule Take 1 capsule (120 mg total) by mouth daily.  . ferrous sulfate 325 (65 FE) MG tablet Take 325 mg by mouth daily with breakfast.  . flecainide (TAMBOCOR) 150 MG tablet TAKE 1 AND 1/2 TABLETS TWICE DAILY  . HYDROcodone-acetaminophen (NORCO/VICODIN) 5-325 MG tablet Take 0.5 tablets by mouth every 4 (four) hours as needed for severe pain.  [name] HYDROcodone-homatropine (HYCODAN) 5-1.5 MG/5ML syrup Take 5 mLs by mouth every 6 (six) hours as needed for cough.  . hydrocortisone cream 1 % Apply 1 application topically 2 (two) times daily as needed for itching.   . Insulin Glargine (LANTUS) 100 UNIT/ML Solostar Pen Inject 2 Units into the skin 2 (two) times daily.   [name] loratadine (CLARITIN) 10 MG tablet Take 10 mg by mouth every evening.   . metFORMIN (GLUCOPHAGE) 1000 MG tablet Take 1,000 mg by mouth 2 (two)  times daily with a meal.   . metoprolol succinate (TOPROL-XL) 25 MG 24 hr tablet TAKE 1/2 TABLET  (12.5 MG TOTAL) BY MOUTH DAILY.  . Multiple Vitamin (MULTIVITAMIN WITH MINERALS) TABS tablet Take 1 tablet by mouth every evening.   . nitroGLYCERIN (NITROSTAT) 0.4 MG SL tablet Place 1 tablet (0.4 mg total) under the tongue every 5 (five) minutes x 3 doses as needed for chest pain.  . pravastatin (PRAVACHOL) 40 MG tablet TAKE 1 TABLET EVERY EVENING  . tamsulosin (FLOMAX) 0.4 MG CAPS capsule Take 0.4 mg by mouth every morning.  . warfarin (COUMADIN) 5 MG tablet TAKE 1 TO 1 AND 1/2 TABLETS DAILY AS DIRECTED    No Known Allergies  Social History   Social History  . Marital status: Widowed    Spouse name: [name]  . Number of children: [name]  . Years of education: [name]   Social History Main Topics  . Smoking status: Current Every Day Smoker    Packs/day: 0.50    Years: 55.00    Types: Cigarettes    . Smokeless tobacco: Never Used     Comment: He's been smoking between 0.5 and 2 ppd since age 85.  [name] Alcohol use No     Comment: "quit drinking in 1986"  . Drug use: No  . Sexual activity: No   Other Topics Concern  . None   Social History Narrative   He is a widower, who recently moved to [place] back in 2011 to live closer to his daughter. He formerly lived in [place]. He is a father 25, grandfather, and great-grandfather of 50. Unfortunately as I mentioned he's gone back to smoking. Smoking about a half pack a day now. He is not very active, but does try to get outside and walk some. He does not drink alcohol.    family history includes Cancer in his mother; Heart attack in his brother and father.  Wt Readings from Last 3 Encounters:  05/26/16 85.3 kg (188 lb)  11/19/15 82.9 kg (182 lb 12.8 oz)  10/23/15 80.7 kg (178 lb)    PHYSICAL EXAM BP (!) 124/52   Pulse 88   Ht 5\' 6"  (1.676 m)   Wt 85.3 kg (188 lb)   BMI 30.34 kg/m  General appearance: alert, cooperative, appears stated age, no distress and Mild truncal obesity. He smells of cigarettes, and is mildly disheveled. He answers questions appropriately with normal mood and affect. He does have some pressured speech and can go on tangents. His daughter often answers questions for him.  Neck: no adenopathy, no carotid bruit and no JVD Lungs: clear to auscultation bilaterally with the exception of mild x-ray wheezes in the bases- , normal percussion bilaterally and non-labored. He does use accessory muscles with increased AP diameter, but no increased worker breathing  Heart: regular rate and rhythm, S1& S2 normal, no murmur, click, rub or gallop; nondisplaced PMI.  Abdomen: soft, non-tender; bowel sounds normal; no masses,  no organomegaly; no HJR. Obese Extremities: extremities normal, atraumatic, no cyanosis, and edema - trivial; Pulses: 2+ and symmetric;  Skin: mobility and turgor normal, no edema, no lesions noted  and Mild bruising on his forearms and hands. Neurologic: Mental status: Alert, oriented, thought content appropriate    Adult ECG Report n/a  Other studies Reviewed: Additional studies/ records that were reviewed today include:  Recent Labs:  Checked by PCP   ASSESSMENT / PLAN: Problem List Items Addressed This Visit    Anticoagulation goal of  INR 2 to 3, for PAF - CHA2DS2Vasc = 7 - Primary (Chronic)    No recurrent falls now. Maintaining stable INR. No major bleeding issues.  He will need to hold his home warfarin 5 days prior to his eye surgery. Would then simply restart 1-2 days postop. I will notify our pharmacy team.      Chronic diastolic congestive heart failure (HCC) (Chronic)    Euvolemic on exam. Not requiring any diuretic. As long as he remains in sinus rhythm, he seems to do okay. He is on a good dose of Toprol. Also on diltiazem as opposed to ARB because of his A. fib.      Coronary artery disease, non-occlusive (Chronic)    Ischemic evaluation the past included a cardiac catheterization in 2012 followed up by a negative Myoview in April 2015. In order to delay progression of disease, continue aggressive management with aspirin and statin along with beta blocker.      Relevant Orders   EKG 12-Lead   Essential hypertension (Chronic)    Controlled on current meds including Toprol and diltiazem.      PAF (paroxysmal atrial fibrillation), maintaining SR; CHA2DS2Vasc = 7 (Chronic)    Maintaining sinus rhythm with flecainide at 150 mg twice a day. He is also on accommodation of Toprol and diltiazem for rate control. As far as I can tell he is not had a breakthrough in quite some time now. We talked about potential options for breakthrough being to take a full 200 mg of flecainide twice a day to see if it will break. If that did not work, with him being stable on warfarin, he could come in for cardioversion as well.  Stable dose of warfarin with well-controlled INR. He  does not seem to be interested in switching to [place]. He asked about doing home monitoring, but after discussion with our pharmacy team, this is probably not a good option for him.      Relevant Orders   EKG 12-Lead   Pre-syncope    No further episodes.      Relevant Orders   EKG 12-Lead   Tobacco abuse counseling (Chronic)    He cut down a bit, but is now still not willing to make the final commitment to fully quit. We talked briefly, but this was not something he is interested in discussing. His daughter simply said he's been doing it forever and will not stop.         Current medicines are reviewed at length with the patient today. (+/- concerns) None  The following changes have been made: None   Patient Instructions  No changes with current medications   Okay to have surgery  It will be okay to do home INR testing after surgery. Will discuss with pharmacist.   Your physician wants you to follow-up in 6 months with Ignacia Bayley, NP.  You will receive a reminder letter in the mail two months in advance. If you don't receive a letter, please call our office to schedule the follow-up appointment.  Your physician wants you to follow-up in: 12 months with Dr. [name]. You will receive a reminder letter in the mail two months in advance. If you don't receive a letter, please call our office to schedule the follow-up appointment.   Studies Ordered:   Orders Placed This Encounter  Procedures  . EKG 12-Lead      Glenetta Hew, M.D., M.S. Interventional Cardiologist   Pager # [phone] Phone # [phone] [address]  [place]. [place], [place] [address]

## 2016-05-28 ENCOUNTER — Encounter: Payer: Self-pay | Admitting: Cardiology

## 2016-05-28 NOTE — Assessment & Plan Note (Signed)
Controlled on current meds including Toprol and diltiazem.

## 2016-05-28 NOTE — Assessment & Plan Note (Addendum)
No recurrent falls now. Maintaining stable INR. No major bleeding issues.  He will need to hold his home warfarin 5 days prior to his eye surgery. Would then simply restart 1-2 days postop. I will notify our pharmacy team.

## 2016-05-28 NOTE — Assessment & Plan Note (Signed)
Ischemic evaluation the past included a cardiac catheterization in 2012 followed up by a negative Myoview in April 2015. In order to delay progression of disease, continue aggressive management with aspirin and statin along with beta blocker.

## 2016-05-28 NOTE — Assessment & Plan Note (Signed)
Euvolemic on exam. Not requiring any diuretic. As long as he remains in sinus rhythm, he seems to do okay. He is on a good dose of Toprol. Also on diltiazem as opposed to ARB because of his A. fib.

## 2016-05-28 NOTE — Assessment & Plan Note (Signed)
He cut down a bit, but is now still not willing to make the final commitment to fully quit. We talked briefly, but this was not something he is interested in discussing. His daughter simply said he's been doing it forever and will not stop.

## 2016-05-28 NOTE — Assessment & Plan Note (Signed)
No further episodes

## 2016-05-28 NOTE — Assessment & Plan Note (Addendum)
Maintaining sinus rhythm with flecainide at 150 mg twice a day. He is also on accommodation of Toprol and diltiazem for rate control. As far as I can tell he is not had a breakthrough in quite some time now. We talked about potential options for breakthrough being to take a full 200 mg of flecainide twice a day to see if it will break. If that did not work, with him being stable on warfarin, he could come in for cardioversion as well.  Stable dose of warfarin with well-controlled INR. He does not seem to be interested in switching to [place]. He asked about doing home monitoring, but after discussion with our pharmacy team, this is probably not a good option for him.

## 2016-06-01 ENCOUNTER — Telehealth: Payer: Self-pay | Admitting: Cardiology

## 2016-06-01 NOTE — Telephone Encounter (Signed)
[name] [facility] ) is calling because she sent over a Medical clearance form to stop warfarin , but also want to know can he stop his Aspirin 5 days prior and she is faxing a new medical clearance form about this . Thanks

## 2016-06-01 NOTE — Telephone Encounter (Signed)
This is for MD to determine, will forward

## 2016-06-01 NOTE — Telephone Encounter (Signed)
Please advise 

## 2016-06-05 NOTE — Telephone Encounter (Signed)
Perfectly fine to stop aspirin as well as warfarin.

## 2016-06-07 NOTE — Telephone Encounter (Signed)
Called office to inform information sent.

## 2016-06-07 NOTE — Telephone Encounter (Signed)
ROUTED INFORMATION TO [facility]

## 2016-06-11 ENCOUNTER — Other Ambulatory Visit: Payer: Self-pay | Admitting: Cardiology

## 2016-06-12 ENCOUNTER — Encounter (HOSPITAL_COMMUNITY): Payer: Self-pay | Admitting: Emergency Medicine

## 2016-06-12 ENCOUNTER — Emergency Department (HOSPITAL_COMMUNITY)
Admission: EM | Admit: 2016-06-12 | Discharge: 2016-06-12 | Disposition: A | Discharge status: Home / Self Care | Payer: Medicare Other | Attending: Emergency Medicine | Admitting: Emergency Medicine

## 2016-06-12 ENCOUNTER — Emergency Department (HOSPITAL_COMMUNITY): Payer: Medicare Other

## 2016-06-12 DIAGNOSIS — Z7901 Long term (current) use of anticoagulants: Secondary | ICD-10-CM | POA: Diagnosis not present

## 2016-06-12 DIAGNOSIS — Z7982 Long term (current) use of aspirin: Secondary | ICD-10-CM | POA: Diagnosis not present

## 2016-06-12 DIAGNOSIS — J449 Chronic obstructive pulmonary disease, unspecified: Secondary | ICD-10-CM | POA: Diagnosis not present

## 2016-06-12 DIAGNOSIS — I5032 Chronic diastolic (congestive) heart failure: Secondary | ICD-10-CM | POA: Diagnosis not present

## 2016-06-12 DIAGNOSIS — Z79899 Other long term (current) drug therapy: Secondary | ICD-10-CM | POA: Diagnosis not present

## 2016-06-12 DIAGNOSIS — I110 Hypertensive heart disease with heart failure: Secondary | ICD-10-CM | POA: Insufficient documentation

## 2016-06-12 DIAGNOSIS — R935 Abnormal findings on diagnostic imaging of other abdominal regions, including retroperitoneum: Secondary | ICD-10-CM | POA: Insufficient documentation

## 2016-06-12 DIAGNOSIS — I2510 Atherosclerotic heart disease of native coronary artery without angina pectoris: Secondary | ICD-10-CM | POA: Insufficient documentation

## 2016-06-12 DIAGNOSIS — Z794 Long term (current) use of insulin: Secondary | ICD-10-CM | POA: Diagnosis not present

## 2016-06-12 DIAGNOSIS — Z8673 Personal history of transient ischemic attack (TIA), and cerebral infarction without residual deficits: Secondary | ICD-10-CM | POA: Diagnosis not present

## 2016-06-12 DIAGNOSIS — F17210 Nicotine dependence, cigarettes, uncomplicated: Secondary | ICD-10-CM | POA: Insufficient documentation

## 2016-06-12 DIAGNOSIS — N200 Calculus of kidney: Secondary | ICD-10-CM | POA: Diagnosis not present

## 2016-06-12 DIAGNOSIS — M545 Low back pain, unspecified: Secondary | ICD-10-CM

## 2016-06-12 DIAGNOSIS — E119 Type 2 diabetes mellitus without complications: Secondary | ICD-10-CM | POA: Insufficient documentation

## 2016-06-12 LAB — URINALYSIS, ROUTINE W REFLEX MICROSCOPIC
BILIRUBIN URINE: NEGATIVE
Glucose, UA: NEGATIVE mg/dL
HGB URINE DIPSTICK: NEGATIVE
KETONES UR: NEGATIVE mg/dL
Leukocytes, UA: NEGATIVE
NITRITE: NEGATIVE
Protein, ur: NEGATIVE mg/dL
SPECIFIC GRAVITY, URINE: 1.016 (ref 1.005–1.030)
pH: 5 (ref 5.0–8.0)

## 2016-06-12 MED ORDER — OXYCODONE-ACETAMINOPHEN 5-325 MG PO TABS: 2.0000 | ORAL_TABLET | Freq: Once | ORAL | Status: DC

## 2016-06-12 MED ORDER — OXYCODONE-ACETAMINOPHEN 5-325 MG PO TABS: 1.0000 | ORAL_TABLET | ORAL | 0 refills | Status: DC | PRN

## 2016-06-12 MED ORDER — OXYCODONE-ACETAMINOPHEN 5-325 MG PO TABS
1.0000 | ORAL_TABLET | Freq: Once | ORAL | Status: AC
  Administered 2016-06-12: 1 via ORAL
  Filled 2016-06-12: qty 1

## 2016-06-12 MED ORDER — OXYCODONE-ACETAMINOPHEN 5-325 MG PO TABS: 2.0000 | ORAL_TABLET | ORAL | 0 refills | Status: DC | PRN

## 2016-06-12 NOTE — ED Provider Notes (Signed)
[facility] DEPT Provider Note   CSN: [id number] Arrival date & time: 06/12/16  1853  By signing my name below, I, Neta Mends, attest that this documentation has been prepared under the direction and in the presence of Alyse Low, [place]. Electronically Signed: Neta Mends, ED Scribe. 06/12/2016. 9:47 PM.    History   Chief Complaint Chief Complaint  Patient presents with  . Back Pain  . Hip Pain    The history is provided by the patient. No language interpreter was used.   HPI Comments:  [name] is a 76 y.o. male with PMHx of DM who presents to the Emergency Department complaining of constant lower back pain x 6-8 months. He states that the pain has pain switches back and forth between the lower left and right back. Pt has been taking hydrocodone for pain with no relief. Pt denies any injury/trauma.   PCP: [name], MD   Past Medical History:  Diagnosis Date  . Anemia    takes Ferrous Sulfate daily  . Arthritis    "all over"  . Atrial flutter ([place])    a. 07/2010 Status post caval tricuspid isthmus ablation by Dr. Midge Aver Metoprolol daily  . Balance problem 01/2014  . CAP (community acquired pneumonia) 09/18/2014  . Cervical radiculopathy due to degenerative joint disease of spine   . COPD (chronic obstructive pulmonary disease) ([facility])   . Coronary artery disease, non-occlusive    a. 03/2010 Nonocclusive disease by cath, performed for ST elevations on ECG;  b. 06/2013 Lexi MV: EF 60%, no ischemia.  . Diabetes mellitus type II    takes Metformin and Lantus daily  . Diastolic CHF, chronic ([place])    a. 12/2012 EF 55-60%, diast dysfxn, triv MR, mildly dil LA/RA.  [name] Dysrhythmia    HX OF ATRIAL IFB /FLUTTER takes Flecanide and Coumadin daily  . History of blood transfusion 1982   "when I had stomach OR"  . History of bronchitis    1998  . History of gastric ulcer   . HTN (hypertension)    takes Diltiazem daily  . Hyperlipidemia    takes  Pravastatin daily  . Joint pain   . PAF (paroxysmal atrial fibrillation) ([facility])    a. Recurrent after atrial flutter, currently controlled on flecainide plus diltiazem  . Pneumonia 1999  . Weakness    numbness and tingling both hands    Patient Active Problem List   Diagnosis Date Noted  . Syncope 05/15/2015  . Cervical disc disease 05/15/2015  . Abnormal urinalysis 05/15/2015  . Anemia 05/15/2015  . Normocytic anemia 03/10/2015  . History of CVA (cerebrovascular accident) 04/03/2014  . Near syncope 02/15/2014  . Pre-syncope 02/15/2014  . Benign neoplasm of rectum and anal canal 12/02/2013  . Lower gastrointestinal bleeding 11/30/2013  . Tobacco use disorder 11/30/2013  . PAF (paroxysmal atrial fibrillation), maintaining SR; CHA2DS2Vasc = 7 08/15/2013  . Anticoagulation goal of INR 2 to 3, for PAF - CHA2DS2Vasc = 7 08/15/2013  . Type 2 diabetes mellitus ([place]) 01/22/2013  . Tobacco abuse counseling 12/15/2012  . Hyperlipidemia   . Obesity (BMI 30-39.9) 12/14/2012  . Atrial flutter - status post CTI ablation 07/29/2010  . Essential hypertension 07/29/2010  . Chronic diastolic congestive heart failure ([place]) 07/29/2010  . Coronary artery disease, non-occlusive 07/29/2010    Past Surgical History:  Procedure Laterality Date  . ATRIAL ABLATION SURGERY  08/05/10   CTI ablation for atrial flutter by JA  . CARDIAC CATHETERIZATION  2012  nl LV function, no occlusive CAD, PAF  . CARDIOVERSION  12/07/2010    Successful direct current cardioversion with atrial fibrillation to normal sinus rhythm  . CARPAL TUNNEL RELEASE Bilateral 01/30/2014   Procedure: BILATERAL CARPAL TUNNEL RELEASE;  Surgeon: Marianna Payment, MD;  Location: [facility];  Service: Orthopedics;  Laterality: Bilateral;  . CATARACT EXTRACTION W/ INTRAOCULAR LENS  IMPLANT, BILATERAL Bilateral   . COLONOSCOPY [name] 12/02/2013   Procedure: COLONOSCOPY;  Surgeon: Irene Shipper, MD;  Location: [facility];  Service: Endoscopy;   Laterality: [name];  . ESOPHAGOGASTRODUODENOSCOPY [name] 09/22/2014   Procedure: ESOPHAGOGASTRODUODENOSCOPY (EGD);  Surgeon: Ronald Lobo, MD;  Location: [facility] ENDOSCOPY;  Service: Endoscopy;  Laterality: [name];  . INCISION AND DRAINAGE ABSCESS / HEMATOMA OF BURSA / KNEE / THIGH Left 1998   knee  . KNEE BURSECTOMY Left 1998  . LAPAROSCOPIC CHOLECYSTECTOMY  03/2010  . NM MYOVIEW LTD  07/22/2013   Normal EF ~60%, no ischemia or infarction.  [name] PARTIAL GASTRECTOMY  1982   subtotal; "took out 30% for ulcers"  . TRANSTHORACIC ECHOCARDIOGRAM  02/16/2014   EF 60%, no RWMA. - otherwise normal  . YAG LASER APPLICATION Bilateral        Home Medications    Prior to Admission medications   Medication Sig Start Date End Date Taking? Authorizing Provider  acetaminophen (TYLENOL) 650 MG CR tablet Take 650 mg by mouth 2 (two) times daily as needed for pain.    Historical Provider, MD  aspirin EC 81 MG tablet Take 81 mg by mouth daily.    Historical Provider, MD  diltiazem (CARDIZEM CD) 120 MG 24 hr capsule Take 1 capsule (120 mg total) by mouth daily. 03/19/15   Erlene Quan, PA-C  ferrous sulfate 325 (65 FE) MG tablet Take 325 mg by mouth daily with breakfast.    Historical Provider, MD  flecainide (TAMBOCOR) 150 MG tablet TAKE 1 AND 1/2 TABLETS TWICE DAILY 12/20/15   Leonie Man, MD  HYDROcodone-acetaminophen (NORCO/VICODIN) 5-325 MG tablet Take 0.5 tablets by mouth every 4 (four) hours as needed for severe pain. 7/61/60   Delora Fuel, MD  HYDROcodone-homatropine [facility]) 5-1.5 MG/5ML syrup Take 5 mLs by mouth every 6 (six) hours as needed for cough. 02/23/16   Charlesetta Shanks, MD  hydrocortisone cream 1 % Apply 1 application topically 2 (two) times daily as needed for itching.  04/27/15   Historical Provider, MD  Insulin Glargine (LANTUS) 100 UNIT/ML Solostar Pen Inject 2 Units into the skin 2 (two) times daily.     Historical Provider, MD  loratadine (CLARITIN) 10 MG tablet Take 10 mg by mouth every  evening.     Historical Provider, MD  metFORMIN (GLUCOPHAGE) 1000 MG tablet Take 1,000 mg by mouth 2 (two) times daily with a meal.     Historical Provider, MD  metoprolol succinate (TOPROL-XL) 25 MG 24 hr tablet TAKE 1/2 TABLET  (12.5 MG TOTAL) BY MOUTH DAILY. 04/28/16   Leonie Man, MD  Multiple Vitamin (MULTIVITAMIN WITH MINERALS) TABS tablet Take 1 tablet by mouth every evening.     Historical Provider, MD  nitroGLYCERIN (NITROSTAT) 0.4 MG SL tablet Place 1 tablet (0.4 mg total) under the tongue every 5 (five) minutes x 3 doses as needed for chest pain. 11/19/15   Erlene Quan, PA-C  pravastatin (PRAVACHOL) 40 MG tablet TAKE 1 TABLET EVERY EVENING 04/25/16   Leonie Man, MD  tamsulosin [facility]) 0.4 MG CAPS capsule Take 0.4 mg by mouth every morning.  04/19/15   Historical Provider, MD  warfarin (COUMADIN) 5 MG tablet TAKE 1 TO 1 AND 1/2 TABLETS DAILY AS DIRECTED 03/24/16   Leonie Man, MD    Family History Family History  Problem Relation Age of Onset  . Cancer Mother   . Heart attack Father   . Heart attack Brother     Social History Social History  Substance Use Topics  . Smoking status: Current Every Day Smoker    Packs/day: 0.50    Years: 55.00    Types: Cigarettes  . Smokeless tobacco: Never Used     Comment: He's been smoking between 0.5 and 2 ppd since age 23.  [name] Alcohol use No     Comment: "quit drinking in 1986"     Allergies   Patient has no known allergies.   Review of Systems Review of Systems  Constitutional: Negative for fever.  Gastrointestinal: Negative for diarrhea.  Musculoskeletal: Positive for back pain.     Physical Exam Updated Vital Signs BP 122/62   Pulse 70   Temp 98.5 F (36.9 C) (Oral)   Resp 16   SpO2 99%   Physical Exam  Constitutional: He appears well-developed and well-nourished. No distress.  HENT:  Head: Normocephalic and atraumatic.  Eyes: Conjunctivae are normal.  Cardiovascular: Normal rate.   Pulmonary/Chest:  Effort normal.  Abdominal: He exhibits no distension.  Neurological: He is alert.  Skin: Skin is warm and dry.  Psychiatric: He has a normal mood and affect.  Nursing note and vitals reviewed.    ED Treatments / Results  DIAGNOSTIC STUDIES:  Oxygen Saturation is 99% on RA, normal by my interpretation.    COORDINATION OF CARE:  9:47 PM Discussed treatment plan with pt at bedside and pt agreed to plan.   Labs (all labs ordered are listed, but only abnormal results are displayed) Labs Reviewed - No data to display  EKG  EKG Interpretation None       Radiology No results found.  Procedures Procedures (including critical care time)  Medications Ordered in ED Medications - No data to display   Initial Impression / Assessment and Plan / ED Course  I have reviewed the triage vital signs and the nursing notes.  Pertinent labs & imaging results that were available during my care of the patient were reviewed by me and considered in my medical decision making (see chart for details).     Database reviewed.  No entry's for name and birth date.  Pt reports he takes hydrocodone but has no pain relief.  Pt ask for stronger medication.  Pt reports pain was helped by percocet.  Pt counseled on pain medication.  He is advised to see his Md for recheck and to discuss pain management  Final Clinical Impressions(s) / ED Diagnoses   Final diagnoses:  Low back pain without sciatica, unspecified back pain laterality, unspecified chronicity    New Prescriptions Discharge Medication List as of 06/12/2016 11:03 PM    An After Visit Summary was printed and given to the patient. Meds ordered this encounter  Medications  . DISCONTD: oxyCODONE-acetaminophen (PERCOCET/ROXICET) 5-325 MG tablet    Sig: Take 2 tablets by mouth every 4 (four) hours as needed for severe pain.    Dispense:  15 tablet    Refill:  0    Order Specific Question:   Supervising Provider    Answer:   MILLER, BRIAN  [[id number]]  . DISCONTD: oxyCODONE-acetaminophen (PERCOCET/ROXICET) 5-325 MG per tablet 2 tablet  .  DISCONTD: oxyCODONE-acetaminophen (PERCOCET/ROXICET) 5-325 MG tablet    Sig: Take 1-2 tablets by mouth every 4 (four) hours as needed for severe pain.    Dispense:  10 tablet    Refill:  0    Order Specific Question:   Supervising Provider    Answer:   Sabra Heck, BRIAN [[id number]]  . oxyCODONE-acetaminophen (PERCOCET/ROXICET) 5-325 MG per tablet 1 tablet    I personally performed the services in this documentation, which was scribed in my presence.  The recorded information has been reviewed and considered.   Ronnald Collum.    Hollace Kinnier [place], PA-C 06/13/16 0030    Isla Pence, MD 06/13/16 1323

## 2016-06-12 NOTE — ED Notes (Signed)
Pt up to bathroom.

## 2016-06-12 NOTE — Discharge Instructions (Signed)
Return if any problems.

## 2016-06-12 NOTE — ED Notes (Signed)
Patient transported to CT 

## 2016-06-12 NOTE — ED Triage Notes (Signed)
Pt reports ongoing lower back pain, states he takes hydrocodone for it, reports his doctor has checked his kidney function and urine recently and did not find anything. Pt denies any falls. States pain switches from right to left side.

## 2016-06-13 NOTE — Telephone Encounter (Signed)
Rx(s) sent to pharmacy electronically.  

## 2016-06-16 ENCOUNTER — Telehealth: Payer: Self-pay | Admitting: Cardiology

## 2016-06-16 ENCOUNTER — Ambulatory Visit (INDEPENDENT_AMBULATORY_CARE_PROVIDER_SITE_OTHER): Payer: Medicare Other | Admitting: Pharmacist

## 2016-06-16 DIAGNOSIS — Z7901 Long term (current) use of anticoagulants: Secondary | ICD-10-CM | POA: Diagnosis not present

## 2016-06-16 DIAGNOSIS — I2510 Atherosclerotic heart disease of native coronary artery without angina pectoris: Secondary | ICD-10-CM

## 2016-06-16 DIAGNOSIS — I480 Paroxysmal atrial fibrillation: Secondary | ICD-10-CM | POA: Diagnosis not present

## 2016-06-16 LAB — POCT INR: INR: 8

## 2016-06-16 LAB — PROTIME-INR
INR: 7 — AB
PROTHROMBIN TIME: 68.3 s — AB (ref 9.0–11.5)

## 2016-06-16 NOTE — Telephone Encounter (Signed)
Re: stat INR results. discussed w Erasmo Downer, routed to Raquel for follow up.

## 2016-06-16 NOTE — Telephone Encounter (Signed)
Thank you for the update.  See coumadin note for addtiotnal details. Patient and daughter aware of plan to hold warfarin and repeat INR on Monday.

## 2016-06-16 NOTE — Telephone Encounter (Signed)
Jonelle Sidle with [facility] is calling to make you aware that the patient results are available for viewing. Thanks.

## 2016-06-19 ENCOUNTER — Ambulatory Visit (INDEPENDENT_AMBULATORY_CARE_PROVIDER_SITE_OTHER): Payer: Medicare Other | Admitting: Pharmacist Clinician (PhC)/ Clinical Pharmacy Specialist

## 2016-06-19 DIAGNOSIS — Z7901 Long term (current) use of anticoagulants: Secondary | ICD-10-CM | POA: Diagnosis not present

## 2016-06-19 DIAGNOSIS — I2510 Atherosclerotic heart disease of native coronary artery without angina pectoris: Secondary | ICD-10-CM

## 2016-06-19 DIAGNOSIS — I480 Paroxysmal atrial fibrillation: Secondary | ICD-10-CM | POA: Diagnosis not present

## 2016-06-19 LAB — POCT INR: INR: 1.4

## 2016-06-26 ENCOUNTER — Ambulatory Visit (INDEPENDENT_AMBULATORY_CARE_PROVIDER_SITE_OTHER): Payer: Medicare Other | Admitting: Pharmacist Clinician (PhC)/ Clinical Pharmacy Specialist

## 2016-06-26 DIAGNOSIS — Z7901 Long term (current) use of anticoagulants: Secondary | ICD-10-CM

## 2016-06-26 DIAGNOSIS — I480 Paroxysmal atrial fibrillation: Secondary | ICD-10-CM | POA: Diagnosis not present

## 2016-06-26 DIAGNOSIS — I2510 Atherosclerotic heart disease of native coronary artery without angina pectoris: Secondary | ICD-10-CM

## 2016-06-26 LAB — POCT INR: INR: 3.2

## 2016-07-10 ENCOUNTER — Emergency Department (HOSPITAL_COMMUNITY): Payer: Medicare Other

## 2016-07-10 ENCOUNTER — Encounter (HOSPITAL_COMMUNITY): Payer: Self-pay

## 2016-07-10 ENCOUNTER — Inpatient Hospital Stay (HOSPITAL_COMMUNITY): Payer: Medicare Other

## 2016-07-10 ENCOUNTER — Inpatient Hospital Stay (HOSPITAL_COMMUNITY)
Admission: EM | Admit: 2016-07-10 | Discharge: 2016-07-12 | DRG: 812 | Disposition: A | Discharge status: Home / Self Care | Payer: Medicare Other | Attending: Internal Medicine | Admitting: Internal Medicine

## 2016-07-10 DIAGNOSIS — R911 Solitary pulmonary nodule: Secondary | ICD-10-CM

## 2016-07-10 DIAGNOSIS — J449 Chronic obstructive pulmonary disease, unspecified: Secondary | ICD-10-CM | POA: Diagnosis present

## 2016-07-10 DIAGNOSIS — S3991XA Unspecified injury of abdomen, initial encounter: Secondary | ICD-10-CM | POA: Diagnosis not present

## 2016-07-10 DIAGNOSIS — Z7982 Long term (current) use of aspirin: Secondary | ICD-10-CM

## 2016-07-10 DIAGNOSIS — R195 Other fecal abnormalities: Secondary | ICD-10-CM

## 2016-07-10 DIAGNOSIS — D649 Anemia, unspecified: Secondary | ICD-10-CM

## 2016-07-10 DIAGNOSIS — R109 Unspecified abdominal pain: Secondary | ICD-10-CM | POA: Diagnosis not present

## 2016-07-10 DIAGNOSIS — E669 Obesity, unspecified: Secondary | ICD-10-CM | POA: Diagnosis present

## 2016-07-10 DIAGNOSIS — Z8673 Personal history of transient ischemic attack (TIA), and cerebral infarction without residual deficits: Secondary | ICD-10-CM | POA: Diagnosis not present

## 2016-07-10 DIAGNOSIS — R791 Abnormal coagulation profile: Secondary | ICD-10-CM | POA: Diagnosis present

## 2016-07-10 DIAGNOSIS — E782 Mixed hyperlipidemia: Secondary | ICD-10-CM | POA: Diagnosis present

## 2016-07-10 DIAGNOSIS — K922 Gastrointestinal hemorrhage, unspecified: Secondary | ICD-10-CM

## 2016-07-10 DIAGNOSIS — E860 Dehydration: Secondary | ICD-10-CM

## 2016-07-10 DIAGNOSIS — R531 Weakness: Secondary | ICD-10-CM

## 2016-07-10 DIAGNOSIS — K210 Gastro-esophageal reflux disease with esophagitis: Secondary | ICD-10-CM | POA: Diagnosis present

## 2016-07-10 DIAGNOSIS — Z79899 Other long term (current) drug therapy: Secondary | ICD-10-CM | POA: Diagnosis not present

## 2016-07-10 DIAGNOSIS — K5900 Constipation, unspecified: Secondary | ICD-10-CM | POA: Diagnosis present

## 2016-07-10 DIAGNOSIS — G4489 Other headache syndrome: Secondary | ICD-10-CM | POA: Diagnosis not present

## 2016-07-10 DIAGNOSIS — K289 Gastrojejunal ulcer, unspecified as acute or chronic, without hemorrhage or perforation: Secondary | ICD-10-CM | POA: Diagnosis present

## 2016-07-10 DIAGNOSIS — Z6830 Body mass index (BMI) 30.0-30.9, adult: Secondary | ICD-10-CM | POA: Diagnosis not present

## 2016-07-10 DIAGNOSIS — I4892 Unspecified atrial flutter: Secondary | ICD-10-CM | POA: Diagnosis not present

## 2016-07-10 DIAGNOSIS — T182XXA Foreign body in stomach, initial encounter: Secondary | ICD-10-CM | POA: Diagnosis not present

## 2016-07-10 DIAGNOSIS — E119 Type 2 diabetes mellitus without complications: Secondary | ICD-10-CM | POA: Diagnosis not present

## 2016-07-10 DIAGNOSIS — I480 Paroxysmal atrial fibrillation: Secondary | ICD-10-CM | POA: Diagnosis not present

## 2016-07-10 DIAGNOSIS — G8929 Other chronic pain: Secondary | ICD-10-CM | POA: Diagnosis not present

## 2016-07-10 DIAGNOSIS — I10 Essential (primary) hypertension: Secondary | ICD-10-CM | POA: Diagnosis present

## 2016-07-10 DIAGNOSIS — R51 Headache: Secondary | ICD-10-CM | POA: Diagnosis not present

## 2016-07-10 DIAGNOSIS — R05 Cough: Secondary | ICD-10-CM | POA: Diagnosis not present

## 2016-07-10 DIAGNOSIS — F17210 Nicotine dependence, cigarettes, uncomplicated: Secondary | ICD-10-CM | POA: Diagnosis present

## 2016-07-10 DIAGNOSIS — I25118 Atherosclerotic heart disease of native coronary artery with other forms of angina pectoris: Secondary | ICD-10-CM | POA: Diagnosis present

## 2016-07-10 DIAGNOSIS — R0602 Shortness of breath: Secondary | ICD-10-CM | POA: Diagnosis not present

## 2016-07-10 DIAGNOSIS — D500 Iron deficiency anemia secondary to blood loss (chronic): Secondary | ICD-10-CM | POA: Diagnosis not present

## 2016-07-10 DIAGNOSIS — E785 Hyperlipidemia, unspecified: Secondary | ICD-10-CM | POA: Diagnosis present

## 2016-07-10 DIAGNOSIS — I2510 Atherosclerotic heart disease of native coronary artery without angina pectoris: Secondary | ICD-10-CM | POA: Diagnosis present

## 2016-07-10 DIAGNOSIS — Z7901 Long term (current) use of anticoagulants: Secondary | ICD-10-CM | POA: Diagnosis not present

## 2016-07-10 DIAGNOSIS — M25511 Pain in right shoulder: Secondary | ICD-10-CM | POA: Diagnosis not present

## 2016-07-10 DIAGNOSIS — I5032 Chronic diastolic (congestive) heart failure: Secondary | ICD-10-CM | POA: Diagnosis not present

## 2016-07-10 DIAGNOSIS — M25551 Pain in right hip: Secondary | ICD-10-CM | POA: Diagnosis not present

## 2016-07-10 DIAGNOSIS — Z794 Long term (current) use of insulin: Secondary | ICD-10-CM

## 2016-07-10 DIAGNOSIS — B349 Viral infection, unspecified: Secondary | ICD-10-CM | POA: Diagnosis present

## 2016-07-10 DIAGNOSIS — M5489 Other dorsalgia: Secondary | ICD-10-CM | POA: Diagnosis not present

## 2016-07-10 DIAGNOSIS — I110 Hypertensive heart disease with heart failure: Secondary | ICD-10-CM | POA: Diagnosis not present

## 2016-07-10 DIAGNOSIS — M549 Dorsalgia, unspecified: Secondary | ICD-10-CM | POA: Diagnosis not present

## 2016-07-10 DIAGNOSIS — Z85048 Personal history of other malignant neoplasm of rectum, rectosigmoid junction, and anus: Secondary | ICD-10-CM | POA: Diagnosis not present

## 2016-07-10 DIAGNOSIS — K912 Postsurgical malabsorption, not elsewhere classified: Secondary | ICD-10-CM | POA: Diagnosis present

## 2016-07-10 DIAGNOSIS — E876 Hypokalemia: Secondary | ICD-10-CM | POA: Diagnosis present

## 2016-07-10 DIAGNOSIS — M545 Low back pain: Secondary | ICD-10-CM | POA: Diagnosis not present

## 2016-07-10 DIAGNOSIS — R52 Pain, unspecified: Secondary | ICD-10-CM | POA: Diagnosis not present

## 2016-07-10 DIAGNOSIS — R404 Transient alteration of awareness: Secondary | ICD-10-CM | POA: Diagnosis not present

## 2016-07-10 LAB — URINALYSIS, ROUTINE W REFLEX MICROSCOPIC
BILIRUBIN URINE: NEGATIVE
Bacteria, UA: NONE SEEN
GLUCOSE, UA: NEGATIVE mg/dL
HGB URINE DIPSTICK: NEGATIVE
Ketones, ur: 20 mg/dL — AB
Nitrite: NEGATIVE
PROTEIN: NEGATIVE mg/dL
Specific Gravity, Urine: 1.021 (ref 1.005–1.030)
pH: 5 (ref 5.0–8.0)

## 2016-07-10 LAB — CBC
HEMATOCRIT: 28.6 % — AB (ref 39.0–52.0)
Hemoglobin: 7.9 g/dL — ABNORMAL LOW (ref 13.0–17.0)
MCH: 20.3 pg — ABNORMAL LOW (ref 26.0–34.0)
MCHC: 27.6 g/dL — ABNORMAL LOW (ref 30.0–36.0)
MCV: 73.5 fL — ABNORMAL LOW (ref 78.0–100.0)
Platelets: 319 10*3/uL (ref 150–400)
RBC: 3.89 MIL/uL — AB (ref 4.22–5.81)
RDW: 20.4 % — AB (ref 11.5–15.5)
WBC: 7.3 10*3/uL (ref 4.0–10.5)

## 2016-07-10 LAB — POC OCCULT BLOOD, ED: Fecal Occult Bld: POSITIVE — AB

## 2016-07-10 LAB — COMPREHENSIVE METABOLIC PANEL
ALT: 33 U/L (ref 17–63)
ANION GAP: 10 (ref 5–15)
AST: 33 U/L (ref 15–41)
Albumin: 2.6 g/dL — ABNORMAL LOW (ref 3.5–5.0)
Alkaline Phosphatase: 115 U/L (ref 38–126)
BILIRUBIN TOTAL: 0.4 mg/dL (ref 0.3–1.2)
BUN: 11 mg/dL (ref 6–20)
CO2: 22 mmol/L (ref 22–32)
Calcium: 7.8 mg/dL — ABNORMAL LOW (ref 8.9–10.3)
Chloride: 102 mmol/L (ref 101–111)
Creatinine, Ser: 0.98 mg/dL (ref 0.61–1.24)
Glucose, Bld: 174 mg/dL — ABNORMAL HIGH (ref 65–99)
POTASSIUM: 3.8 mmol/L (ref 3.5–5.1)
Sodium: 134 mmol/L — ABNORMAL LOW (ref 135–145)
TOTAL PROTEIN: 5.6 g/dL — AB (ref 6.5–8.1)

## 2016-07-10 LAB — I-STAT TROPONIN, ED: TROPONIN I, POC: 0 ng/mL (ref 0.00–0.08)

## 2016-07-10 LAB — HEMOGLOBIN AND HEMATOCRIT, BLOOD
HEMATOCRIT: 29.1 % — AB (ref 39.0–52.0)
HEMOGLOBIN: 8.1 g/dL — AB (ref 13.0–17.0)

## 2016-07-10 LAB — PROTIME-INR
INR: 5.2
Prothrombin Time: 49.4 seconds — ABNORMAL HIGH (ref 11.4–15.2)

## 2016-07-10 LAB — GLUCOSE, CAPILLARY: GLUCOSE-CAPILLARY: 144 mg/dL — AB (ref 65–99)

## 2016-07-10 LAB — I-STAT CG4 LACTIC ACID, ED: LACTIC ACID, VENOUS: 1.79 mmol/L (ref 0.5–1.9)

## 2016-07-10 LAB — PREPARE RBC (CROSSMATCH)

## 2016-07-10 MED ORDER — DILTIAZEM HCL ER COATED BEADS 120 MG PO CP24
120.0000 mg | ORAL_CAPSULE | Freq: Every day | ORAL | Status: DC
  Administered 2016-07-11 – 2016-07-12 (×2): 120 mg via ORAL
  Filled 2016-07-10 (×2): qty 1

## 2016-07-10 MED ORDER — SODIUM CHLORIDE 0.9 % IV SOLN: Freq: Once | INTRAVENOUS | Status: DC

## 2016-07-10 MED ORDER — FERROUS SULFATE 325 (65 FE) MG PO TABS
325.0000 mg | ORAL_TABLET | Freq: Every day | ORAL | Status: DC
Start: 2016-07-11 — End: 2016-07-12
  Administered 2016-07-11 – 2016-07-12 (×2): 325 mg via ORAL
  Filled 2016-07-10 (×2): qty 1

## 2016-07-10 MED ORDER — FLECAINIDE ACETATE 50 MG PO TABS
150.0000 mg | ORAL_TABLET | Freq: Two times a day (BID) | ORAL | Status: DC
  Administered 2016-07-10 – 2016-07-12 (×4): 150 mg via ORAL
  Filled 2016-07-10 (×4): qty 1

## 2016-07-10 MED ORDER — HYDROCODONE-ACETAMINOPHEN 5-325 MG PO TABS
1.0000 | ORAL_TABLET | ORAL | Status: DC | PRN
  Administered 2016-07-10 – 2016-07-12 (×8): 1 via ORAL
  Filled 2016-07-10 (×8): qty 1

## 2016-07-10 MED ORDER — IPRATROPIUM-ALBUTEROL 0.5-2.5 (3) MG/3ML IN SOLN: 3.0000 mL | Freq: Once | RESPIRATORY_TRACT | Status: DC

## 2016-07-10 MED ORDER — PANTOPRAZOLE SODIUM 40 MG PO TBEC
40.0000 mg | DELAYED_RELEASE_TABLET | Freq: Two times a day (BID) | ORAL | Status: DC
  Administered 2016-07-11 – 2016-07-12 (×3): 40 mg via ORAL
  Filled 2016-07-10 (×3): qty 1

## 2016-07-10 MED ORDER — PANTOPRAZOLE SODIUM 40 MG IV SOLR
80.0000 mg | Freq: Once | INTRAVENOUS | Status: AC
  Administered 2016-07-10: 80 mg via INTRAVENOUS
  Filled 2016-07-10: qty 80

## 2016-07-10 MED ORDER — METOPROLOL SUCCINATE ER 25 MG PO TB24
12.5000 mg | ORAL_TABLET | Freq: Every day | ORAL | Status: DC
Start: 2016-07-11 — End: 2016-07-12
  Administered 2016-07-11 – 2016-07-12 (×2): 12.5 mg via ORAL
  Filled 2016-07-10 (×2): qty 1

## 2016-07-10 MED ORDER — SODIUM CHLORIDE 0.9 % IV BOLUS (SEPSIS)
1000.0000 mL | Freq: Once | INTRAVENOUS | Status: AC
  Administered 2016-07-10: 1000 mL via INTRAVENOUS

## 2016-07-10 MED ORDER — ACETAMINOPHEN 325 MG PO TABS
650.0000 mg | ORAL_TABLET | Freq: Four times a day (QID) | ORAL | Status: DC | PRN
  Administered 2016-07-12: 650 mg via ORAL
  Filled 2016-07-10: qty 2

## 2016-07-10 MED ORDER — SODIUM CHLORIDE 0.9% FLUSH
3.0000 mL | Freq: Two times a day (BID) | INTRAVENOUS | Status: DC
  Administered 2016-07-10 – 2016-07-12 (×4): 3 mL via INTRAVENOUS

## 2016-07-10 MED ORDER — DICLOFENAC SODIUM 1 % TD GEL
2.0000 g | Freq: Four times a day (QID) | TRANSDERMAL | Status: DC
  Filled 2016-07-10 (×2): qty 100

## 2016-07-10 MED ORDER — INSULIN GLARGINE 100 UNIT/ML ~~LOC~~ SOLN
8.0000 [IU] | Freq: Every day | SUBCUTANEOUS | Status: DC
  Administered 2016-07-10 – 2016-07-11 (×2): 8 [IU] via SUBCUTANEOUS
  Filled 2016-07-10 (×2): qty 0.08

## 2016-07-10 MED ORDER — PRAVASTATIN SODIUM 40 MG PO TABS
40.0000 mg | ORAL_TABLET | Freq: Every evening | ORAL | Status: DC
  Administered 2016-07-10 – 2016-07-11 (×2): 40 mg via ORAL
  Filled 2016-07-10 (×2): qty 1

## 2016-07-10 MED ORDER — ACETAMINOPHEN 650 MG RE SUPP: 650.0000 mg | Freq: Four times a day (QID) | RECTAL | Status: DC | PRN

## 2016-07-10 MED ORDER — LORATADINE 10 MG PO TABS
10.0000 mg | ORAL_TABLET | Freq: Every evening | ORAL | Status: DC
  Administered 2016-07-10 – 2016-07-11 (×2): 10 mg via ORAL
  Filled 2016-07-10 (×2): qty 1

## 2016-07-10 MED ORDER — VITAMIN K1 10 MG/ML IJ SOLN
5.0000 mg | Freq: Once | INTRAVENOUS | Status: AC
  Administered 2016-07-10: 5 mg via INTRAVENOUS
  Filled 2016-07-10: qty 0.5

## 2016-07-10 MED ORDER — NICOTINE 7 MG/24HR TD PT24
7.0000 mg | MEDICATED_PATCH | Freq: Every day | TRANSDERMAL | Status: DC
  Administered 2016-07-10 – 2016-07-12 (×3): 7 mg via TRANSDERMAL
  Filled 2016-07-10 (×3): qty 1

## 2016-07-10 MED ORDER — IOPAMIDOL (ISOVUE-300) INJECTION 61%
INTRAVENOUS | Status: AC
  Administered 2016-07-10: 100 mL via INTRAVENOUS
  Filled 2016-07-10: qty 100

## 2016-07-10 MED ORDER — HYDROMORPHONE HCL 1 MG/ML IJ SOLN
0.5000 mg | Freq: Once | INTRAMUSCULAR | Status: AC
  Administered 2016-07-10: 0.5 mg via INTRAVENOUS
  Filled 2016-07-10: qty 1

## 2016-07-10 NOTE — H&P (Signed)
Date: 07/10/2016               Patient Name:  [name] MRN: [id number]  DOB: 1940/08/25 Age / Sex: 76 y.o.,  male   PCP: Bernerd Limbo, MD         Medical Service: Internal Medicine Teaching Service         Attending Physician: Dr. Lucious Groves, DO    First Contact: Dr. Gay Filler Pager: [phone]  Second Contact: Dr. Tiburcio Pea Pager: [phone]       After Hours (After 5p/  First Contact Pager: [phone]  weekends / holidays): Second Contact Pager: [phone]   Chief Complaint: Symptomatic Anemia  History of Present Illness: [name] is a 75 M with PMHx of   Patient presents with a 3 month history of right shoulder pain and hip pain. Patient states that he hasn't been able to see his pain doctor due to transportation and financial issues. He states that his PCP told him to come to the ED if he has worsening pain. He also states for the past one month he has experienced worsening weakness and fatigue. He reports a similar presentation in 2016 when he required a blood transfusion. He denies any current chest pain, but does admit to some shortness of breath.   Patient admits to yellow-gray diarrhea since this morning. He denies any evidence of melena or hematochezia. He denies abdominal pain. Patient has a history of partial gastrectomy due to h/o GIB from PUD. He states he has done well since then. He takes OTC tylenol, but denies NSAID use. He denies alcohol use.   EGD from 2016 showed status post gastric surgery, with distal gastrectomy and double limb gastroenterostomy (Billroth II anatomy). Marginal erosion and mucosal hemorrhage, presumably accounting for the patient's heme positive stool and iron deficiency anemia. Recommendations were to initiate PPI therapy and maintain him on that indefinitely. If the patient remained persistently heme positive and/or his hemoglobin does not correct, would consider addition of sucralfate to the above-mentioned PPI therapy. This patient may be  iron deficient simply on the basis of his Billroth II anatomy, although there is probably a contribution from the erosive marginal gastritis. Recommended repeat EGD in a few months which did not occur. Pt denies PPI therapy at home.  In the ED, pt was found to be anemic to 7.8 w/ supreatherapeutic INR of 5.2. He received Vitamin K and 2U pRBCs. He HDS on admission.  Meds: Current Facility-Administered Medications  Medication Dose Route Frequency Provider Last Rate Last Dose  . 0.9 %  sodium chloride infusion   Intravenous Once Duffy Bruce, MD      . ipratropium-albuterol (DUONEB) 0.5-2.5 (3) MG/3ML nebulizer solution 3 mL  3 mL Nebulization Once Duffy Bruce, MD      . Derrill Memo ON 07/11/2016] pantoprazole (PROTONIX) EC tablet 40 mg  40 mg Oral BID Vena Rua, PA-C       Current Outpatient Prescriptions  Medication Sig Dispense Refill  . acetaminophen (TYLENOL) 650 MG CR tablet Take 650 mg by mouth 2 (two) times daily as needed for pain.    [name] aspirin EC 81 MG tablet Take 81 mg by mouth daily.    [name] diltiazem (CARDIZEM CD) 120 MG 24 hr capsule Take 1 capsule (120 mg total) by mouth daily. 90 capsule 3  . ferrous sulfate 325 (65 FE) MG tablet Take 325 mg by mouth daily with breakfast.    . flecainide (TAMBOCOR) 150 MG tablet TAKE  1 AND 1/2 TABLETS TWICE DAILY 270 tablet 3  . hydrocortisone cream 1 % Apply 1 application topically 2 (two) times daily as needed for itching.     . Insulin Glargine (LANTUS) 100 UNIT/ML Solostar Pen Inject 4 Units into the skin 4 (four) times daily as needed.     . loratadine (CLARITIN) 10 MG tablet Take 10 mg by mouth every evening.     . metFORMIN (GLUCOPHAGE) 1000 MG tablet Take 1,000 mg by mouth 2 (two) times daily with a meal.     . metoprolol succinate (TOPROL-XL) 25 MG 24 hr tablet TAKE 1/2 TABLET DAILY. (NEED MD APPT FOR FURTHER REFILLS) 45 tablet 3  . Multiple Vitamin (MULTIVITAMIN WITH MINERALS) TABS tablet Take 1 tablet by mouth every evening.     .  nitroGLYCERIN (NITROSTAT) 0.4 MG SL tablet Place 1 tablet (0.4 mg total) under the tongue every 5 (five) minutes x 3 doses as needed for chest pain. 25 tablet 9  . pravastatin (PRAVACHOL) 40 MG tablet TAKE 1 TABLET EVERY EVENING 90 tablet 1  . warfarin (COUMADIN) 5 MG tablet TAKE 1 TO 1 AND 1/2 TABLETS DAILY AS DIRECTED (Patient taking differently: 7.5mg  Sat Sun Tues Thur 5mg  Mon Wed Fri) 135 tablet 1  . HYDROcodone-acetaminophen (NORCO/VICODIN) 5-325 MG tablet Take 0.5 tablets by mouth every 4 (four) hours as needed for severe pain. (Patient not taking: Reported on 07/10/2016) 20 tablet 0  . HYDROcodone-homatropine (HYCODAN) 5-1.5 MG/5ML syrup Take 5 mLs by mouth every 6 (six) hours as needed for cough. (Patient not taking: Reported on 07/10/2016) 120 mL 0    Allergies: Allergies as of 07/10/2016  . (No Known Allergies)   Past Medical History:  Diagnosis Date  . Anemia    takes Ferrous Sulfate daily  . Arthritis    "all over"  . Atrial flutter ([place])    a. 07/2010 Status post caval tricuspid isthmus ablation by Dr. Midge Aver Metoprolol daily  . Balance problem 01/2014  . CAP (community acquired pneumonia) 09/18/2014  . Cervical radiculopathy due to degenerative joint disease of spine   . COPD (chronic obstructive pulmonary disease) ([place])   . Coronary artery disease, non-occlusive    a. 03/2010 Nonocclusive disease by cath, performed for ST elevations on ECG;  b. 06/2013 Lexi MV: EF 60%, no ischemia.  . Diabetes mellitus type II    takes Metformin and Lantus daily  . Diastolic CHF, chronic ([place])    a. 12/2012 EF 55-60%, diast dysfxn, triv MR, mildly dil LA/RA.  [name] Dysrhythmia    HX OF ATRIAL IFB /FLUTTER takes Flecanide and Coumadin daily  . History of blood transfusion 1982   "when I had stomach OR"  . History of bronchitis    1998  . History of gastric ulcer   . HTN (hypertension)    takes Diltiazem daily  . Hyperlipidemia    takes Pravastatin daily  . Joint pain   . PAF  (paroxysmal atrial fibrillation) ([place])    a. Recurrent after atrial flutter, currently controlled on flecainide plus diltiazem  . Pneumonia 1999  . Weakness    numbness and tingling both hands    Family History: Patient's family history includes Cancer in his mother; Heart attack in his brother and father.  Social History:  Tobacco Use: 1/2 ppd Alcohol Use: Denies Illicit Drug Use: Denies Lives with son and daughter in law who help take care of him.   Review of Systems: A complete ROS was reviewed and negative except as  per HPI.   Physical Exam: Blood pressure 115/72, pulse 83, temperature 98.1 F (36.7 C), temperature source Oral, resp. rate (!) 22, height 5\' 8"  (1.727 m), weight 185 lb (83.9 kg), SpO2 100 %. Physical Exam  Constitutional: He is oriented to person, place, and time. He appears well-developed and well-nourished. He is cooperative. No distress.  HENT:  Head: Normocephalic and atraumatic.  Right Ear: Hearing normal.  Left Ear: Hearing normal.  Nose: Nose normal.  Mouth/Throat: Mucous membranes are pale and dry. No oropharyngeal exudate.  Eyes: Pupils are equal, round, and reactive to light.  Cardiovascular: Normal rate, regular rhythm, S1 normal, S2 normal and intact distal pulses.  Exam reveals no gallop.   No murmur heard. Pulmonary/Chest: Effort normal and breath sounds normal. No respiratory distress. He has no wheezes. He has no rhonchi. He has no rales. He exhibits no tenderness.  Abdominal: Soft. Normal appearance and bowel sounds are normal. He exhibits no ascites. There is no hepatosplenomegaly. There is no tenderness.  Neurological: He is alert and oriented to person, place, and time. He has normal strength.  Skin: Skin is warm, dry and intact. He is not diaphoretic. There is pallor.  Psychiatric: He has a normal mood and affect. His speech is normal and behavior is normal.   EKG Interpretation  Date/Time:  Monday July 10 2016 08:31:58  EDT Ventricular Rate:  83 PR Interval:    QRS Duration: 106 QT Interval:  390 QTC Calculation: 459 R Axis:   -34 Text Interpretation:  Sinus rhythm with first degree AV block  Prolonged PR interval Consider left atrial enlargement Left axis deviation No significant change since last tracing Confirmed by ISAACS MD, Lysbeth Galas [phone]) on 07/10/2016 10:23:53 AM  CXR: Personally reviewed. No remarkable acute cardiopulmonary process.  Assessment & Plan by Problem: Principal Problem:   Symptomatic anemia Active Problems:   Atrial flutter - status post CTI ablation   Essential hypertension   Chronic diastolic congestive heart failure (HCC)   Coronary artery disease, non-occlusive   Obesity (BMI 30-39.9)   Hyperlipidemia   Type 2 diabetes mellitus (H[facility])   PAF (paroxysmal atrial fibrillation), maintaining SR; CHA2DS2Vasc = 7   History of CVA (cerebrovascular accident)   Normocytic anemia  Pt is a 76 y.o. male w/ a h/o CHF, t2DM, HTN, PAF s/p ablation on flecanaide and warfarin, h/o PUD s/p gastrectomy w/ Billroth II who presented to the ED with several month h/o progressive generalized weakness which worsened over the last 3 weeks, found to have anemia to 7.8 and positive FOBT.  Symptomatic anemia: GIB: Pt presents with generalized weakness, pallor, Hgb of 7.8 and positive FOBT w/ h/o PUD requiring gastrectomy and hemorrhoids. Pt denies CP or SOB. s/p 2U pRBC in ED. BP soft, but stable. HR wnl. Hgb was 9.8 last fall 2017. - GI c/s, appreciate recs: plan for EGD, clear diet - PPI w/ protonix 40mg  BID - trend CBC  Shoulder pain / hip pain / back pain: Pt had fall 3 weeks ago with no traumatic fractures. h/o vertebral disc herniation. Pain complaints are what prompted pt to present to the ED. Reports that he has a pain doctor who prescribes his medication, but [place] reports only one fill of #15 Vicodin from his PCP 1 month ago. - Norco 5-325mg  q4h - Voltaren gel PRN  DM: Pt has metformin +  Lantus 16U daily home meds. Last A1c 6.0 04/2015. Would not favor tight control d/t concern of hypoglycemia. Hold metformin. - Lantus 8U qHS +  SSI-s qAC  PAF: Supratherapeutic INR: NSR. Presented with INR of 5.2, on warfarin. s/p Vit K reversal in ED. HDS w/o RVR. Pt follows w/ Dr. [name]. On flecainide for rhythm control, Dilt + Metop for rate control, warfarin for AC. EMR reports 225mg  BID dosing for flecainide, but last Cardiology note reports 150mg  BID. - tele - continue home meds  Dispo: Admit patient to Inpatient with expected length of stay greater than 2 midnights.  Signed: Dr. Holley Raring PGY-I, IMTS [phone] 07/10/2016 2:27 PM

## 2016-07-10 NOTE — Progress Notes (Signed)
Dr. Gay Filler aware that of hemoglobin after blood transfusion.

## 2016-07-10 NOTE — ED Provider Notes (Signed)
[facility] DEPT Provider Note   CSN: [id number] Arrival date & time: 07/10/16  4098     History   Chief Complaint Chief Complaint  Patient presents with  . Weakness    pt c/o pain to generalized weakness and pain related to a fall that aggravated the chronic pain he has to his R hip area     HPI [name] is a 76 y.o. male.  HPI   76 yo M with PMhx pAFib on coumadin, h/o gastric ulcer, h/o dCHF, DM2, CAD, COPD here with multiple complaints. Pt states that since a fall in March, he has had progressively worsening bilateral lower lumbar and neck pain. No weakness or numbness. He was seen at the time of fall and had negative CT. Over the past week, however, he has had generalized worsening pain, decreasing appetite, and mild abdominal pain. He has had diarrhea as well and nausea, with decreased PO intake. He states that for the past several days, he has been unable to get out of bed due to generalized weakness, and has been unable to feed himself. He lives with family but is normally fairly independent. Denies any falls since March. No HA or vision changes. He is not sure what his last coumadin level was.  Past Medical History:  Diagnosis Date  . Anemia    takes Ferrous Sulfate daily  . Arthritis    "all over"  . Atrial flutter ([place])    a. 07/2010 Status post caval tricuspid isthmus ablation by Dr. Midge Aver Metoprolol daily  . Balance problem 01/2014  . CAP (community acquired pneumonia) 09/18/2014  . Cervical radiculopathy due to degenerative joint disease of spine   . COPD (chronic obstructive pulmonary disease) ([place])   . Coronary artery disease, non-occlusive    a. 03/2010 Nonocclusive disease by cath, performed for ST elevations on ECG;  b. 06/2013 Lexi MV: EF 60%, no ischemia.  . Diabetes mellitus type II    takes Metformin and Lantus daily  . Diastolic CHF, chronic ([place])    a. 12/2012 EF 55-60%, diast dysfxn, triv MR, mildly dil LA/RA.  [name] Dysrhythmia    HX OF  ATRIAL IFB /FLUTTER takes Flecanide and Coumadin daily  . History of blood transfusion 1982   "when I had stomach OR"  . History of bronchitis    1998  . History of gastric ulcer   . HTN (hypertension)    takes Diltiazem daily  . Hyperlipidemia    takes Pravastatin daily  . Joint pain   . PAF (paroxysmal atrial fibrillation) ([place])    a. Recurrent after atrial flutter, currently controlled on flecainide plus diltiazem  . Pneumonia 1999  . Weakness    numbness and tingling both hands    Patient Active Problem List   Diagnosis Date Noted  . Symptomatic anemia 07/10/2016  . Syncope 05/15/2015  . Cervical disc disease 05/15/2015  . Abnormal urinalysis 05/15/2015  . Anemia 05/15/2015  . Normocytic anemia 03/10/2015  . History of CVA (cerebrovascular accident) 04/03/2014  . Near syncope 02/15/2014  . Pre-syncope 02/15/2014  . Benign neoplasm of rectum and anal canal 12/02/2013  . Lower gastrointestinal bleeding 11/30/2013  . Tobacco use disorder 11/30/2013  . PAF (paroxysmal atrial fibrillation), maintaining SR; CHA2DS2Vasc = 7 08/15/2013  . Anticoagulation goal of INR 2 to 3, for PAF - CHA2DS2Vasc = 7 08/15/2013  . Type 2 diabetes mellitus ([place]) 01/22/2013  . Tobacco abuse counseling 12/15/2012  . Hyperlipidemia   . Obesity (BMI 30-39.9) 12/14/2012  .  Atrial flutter - status post CTI ablation 07/29/2010  . Essential hypertension 07/29/2010  . Chronic diastolic congestive heart failure ([place]) 07/29/2010  . Coronary artery disease, non-occlusive 07/29/2010    Past Surgical History:  Procedure Laterality Date  . ATRIAL ABLATION SURGERY  08/05/10   CTI ablation for atrial flutter by JA  . CARDIAC CATHETERIZATION  2012   nl LV function, no occlusive CAD, PAF  . CARDIOVERSION  12/07/2010    Successful direct current cardioversion with atrial fibrillation to normal sinus rhythm  . CARPAL TUNNEL RELEASE Bilateral 01/30/2014   Procedure: BILATERAL CARPAL TUNNEL RELEASE;  Surgeon:  Marianna Payment, MD;  Location: [facility];  Service: Orthopedics;  Laterality: Bilateral;  . CATARACT EXTRACTION W/ INTRAOCULAR LENS  IMPLANT, BILATERAL Bilateral   . COLONOSCOPY [name] 12/02/2013   Procedure: COLONOSCOPY;  Surgeon: Irene Shipper, MD;  Location: [facility];  Service: Endoscopy;  Laterality: [name];  . ESOPHAGOGASTRODUODENOSCOPY [name] 09/22/2014   Procedure: ESOPHAGOGASTRODUODENOSCOPY (EGD);  Surgeon: Ronald Lobo, MD;  Location: [facility] ENDOSCOPY;  Service: Endoscopy;  Laterality: [name];  . INCISION AND DRAINAGE ABSCESS / HEMATOMA OF BURSA / KNEE / THIGH Left 1998   knee  . KNEE BURSECTOMY Left 1998  . LAPAROSCOPIC CHOLECYSTECTOMY  03/2010  . NM MYOVIEW LTD  07/22/2013   Normal EF ~60%, no ischemia or infarction.  [name] PARTIAL GASTRECTOMY  1982   subtotal; "took out 30% for ulcers"  . TRANSTHORACIC ECHOCARDIOGRAM  02/16/2014   EF 60%, no RWMA. - otherwise normal  . YAG LASER APPLICATION Bilateral        Home Medications    Prior to Admission medications   Medication Sig Start Date End Date Taking? Authorizing Provider  acetaminophen (TYLENOL) 650 MG CR tablet Take 650 mg by mouth 2 (two) times daily as needed for pain.   Yes Historical Provider, MD  aspirin EC 81 MG tablet Take 81 mg by mouth daily.   Yes Historical Provider, MD  diltiazem (CARDIZEM CD) 120 MG 24 hr capsule Take 1 capsule (120 mg total) by mouth daily. 03/19/15  Yes Erlene Quan, PA-C  ferrous sulfate 325 (65 FE) MG tablet Take 325 mg by mouth daily with breakfast.   Yes Historical Provider, MD  flecainide (TAMBOCOR) 150 MG tablet TAKE 1 AND 1/2 TABLETS TWICE DAILY 12/20/15  Yes Leonie Man, MD  hydrocortisone cream 1 % Apply 1 application topically 2 (two) times daily as needed for itching.  04/27/15  Yes Historical Provider, MD  Insulin Glargine (LANTUS) 100 UNIT/ML Solostar Pen Inject 4 Units into the skin 4 (four) times daily as needed.    Yes Historical Provider, MD  loratadine (CLARITIN) 10 MG tablet Take 10 mg  by mouth every evening.    Yes Historical Provider, MD  metFORMIN (GLUCOPHAGE) 1000 MG tablet Take 1,000 mg by mouth 2 (two) times daily with a meal.    Yes Historical Provider, MD  metoprolol succinate (TOPROL-XL) 25 MG 24 hr tablet TAKE 1/2 TABLET DAILY. (NEED MD APPT FOR FURTHER REFILLS) 06/13/16  Yes Leonie Man, MD  Multiple Vitamin (MULTIVITAMIN WITH MINERALS) TABS tablet Take 1 tablet by mouth every evening.    Yes Historical Provider, MD  nitroGLYCERIN (NITROSTAT) 0.4 MG SL tablet Place 1 tablet (0.4 mg total) under the tongue every 5 (five) minutes x 3 doses as needed for chest pain. 11/19/15  Yes Erlene Quan, PA-C  pravastatin (PRAVACHOL) 40 MG tablet TAKE 1 TABLET EVERY EVENING 04/25/16  Yes Leonie Man, MD  warfarin (COUMADIN) 5 MG tablet TAKE 1 TO 1 AND 1/2 TABLETS DAILY AS DIRECTED Patient taking differently: 7.5mg  Sat Sun Tues Thur 5mg  Jory Sims Fri 03/24/16  Yes Leonie Man, MD  HYDROcodone-acetaminophen (NORCO/VICODIN) 5-325 MG tablet Take 0.5 tablets by mouth every 4 (four) hours as needed for severe pain. Patient not taking: Reported on 07/10/2016 9/50/93   Delora Fuel, MD  HYDROcodone-homatropine [facility]) 5-1.5 MG/5ML syrup Take 5 mLs by mouth every 6 (six) hours as needed for cough. Patient not taking: Reported on 07/10/2016 02/23/16   Charlesetta Shanks, MD    Family History Family History  Problem Relation Age of Onset  . Cancer Mother   . Heart attack Father   . Heart attack Brother     Social History Social History  Substance Use Topics  . Smoking status: Current Every Day Smoker    Packs/day: 0.50    Years: 55.00    Types: Cigarettes  . Smokeless tobacco: Never Used     Comment: He's been smoking between 0.5 and 2 ppd since age 86.  [name] Alcohol use No     Comment: "quit drinking in 1986"     Allergies   Patient has no known allergies.   Review of Systems Review of Systems  Constitutional: Positive for fatigue. Negative for fever.  HENT: Negative  for congestion and rhinorrhea.   Respiratory: Positive for cough and shortness of breath.   Gastrointestinal: Positive for abdominal pain, diarrhea and nausea. Negative for vomiting.  Genitourinary: Positive for frequency. Negative for dysuria.  Allergic/Immunologic: Negative for immunocompromised state.  Neurological: Positive for weakness.  All other systems reviewed and are negative.    Physical Exam Updated Vital Signs BP 121/66 (BP Location: Right Arm)   Pulse 87   Temp 98.9 F (37.2 C) (Oral)   Resp (!) 22   Ht 5\' 3"  (1.6 m)   Wt 173 lb 14.4 oz (78.9 kg)   SpO2 98%   BMI 30.81 kg/m   Physical Exam  Constitutional: He is oriented to person, place, and time. He appears well-developed and well-nourished. No distress.  HENT:  Head: Normocephalic and atraumatic.  Mouth/Throat: Oropharynx is clear and moist.  Moderately dry MM  Eyes: Conjunctivae are normal.  Neck: Neck supple.  Cardiovascular: Normal rate, regular rhythm and normal heart sounds.  Exam reveals no friction rub.   No murmur heard. Pulmonary/Chest: Effort normal. No respiratory distress. He has decreased breath sounds. He has wheezes. He has rhonchi in the right lower field and the left lower field. He has no rales.  Abdominal: Soft. He exhibits no distension. Bowel sounds are increased. There is generalized tenderness. There is no rigidity, no rebound and no guarding.  Musculoskeletal: He exhibits no edema.  Neurological: He is alert and oriented to person, place, and time. He exhibits normal muscle tone.  Skin: Skin is warm. Capillary refill takes less than 2 seconds.  Psychiatric: He has a normal mood and affect.  Nursing note and vitals reviewed.    ED Treatments / Results  Labs (all labs ordered are listed, but only abnormal results are displayed) Labs Reviewed  COMPREHENSIVE METABOLIC PANEL - Abnormal; Notable for the following:       Result Value   Sodium 134 (*)    Glucose, Bld 174 (*)     Calcium 7.8 (*)    Total Protein 5.6 (*)    Albumin 2.6 (*)    All other components within normal limits  CBC - Abnormal; Notable for  the following:    RBC 3.89 (*)    Hemoglobin 7.9 (*)    HCT 28.6 (*)    MCV 73.5 (*)    MCH 20.3 (*)    MCHC 27.6 (*)    RDW 20.4 (*)    All other components within normal limits  PROTIME-INR - Abnormal; Notable for the following:    Prothrombin Time 49.4 (*)    INR 5.20 (*)    All other components within normal limits  URINALYSIS, ROUTINE W REFLEX MICROSCOPIC - Abnormal; Notable for the following:    Ketones, ur 20 (*)    Leukocytes, UA TRACE (*)    Squamous Epithelial / LPF 0-5 (*)    All other components within normal limits  POC OCCULT BLOOD, ED - Abnormal; Notable for the following:    Fecal Occult Bld POSITIVE (*)    All other components within normal limits  I-STAT CG4 LACTIC ACID, ED  I-STAT TROPOININ, ED  TYPE AND SCREEN  PREPARE RBC (CROSSMATCH)    EKG  EKG Interpretation  Date/Time:  Monday July 10 2016 08:31:58 EDT Ventricular Rate:  83 PR Interval:    QRS Duration: 106 QT Interval:  390 QTC Calculation: 459 R Axis:   -34 Text Interpretation:  Sinus rhythm with first degree AV block  Prolonged PR interval Consider left atrial enlargement Left axis deviation No significant change since last tracing Confirmed by Jadrian Bulman MD, Lysbeth Galas [phone]) on 07/10/2016 10:23:53 AM       Radiology Dg Chest 2 View  Result Date: 07/10/2016 CLINICAL DATA:  Cough, shortness of breath. EXAM: CHEST  2 VIEW COMPARISON:  Radiographs of February 23, 2016. FINDINGS: The heart size and mediastinal contours are within normal limits. No consolidative process is noted. Rounded nodular density seen projected over right lower lobe. No pneumothorax or pleural effusion is noted. The visualized skeletal structures are unremarkable. IMPRESSION: Nodular rounded density seen projected over right lower lobe which may simply represent overlying nipple shadow, but  pulmonary nodule cannot be excluded. Repeat radiograph nipple markers is recommended. No other abnormality seen in the chest Electronically Signed   By: Marijo Conception, M.D.   On: 07/10/2016 10:27   Ct Abdomen Pelvis W Contrast  Result Date: 07/10/2016 CLINICAL DATA:  Fall out of bed landing on the left hip 3 weeks ago. Persistent pain. EXAM: CT ABDOMEN AND PELVIS WITH CONTRAST TECHNIQUE: Multidetector CT imaging of the abdomen and pelvis was performed using the standard protocol following bolus administration of intravenous contrast. CONTRAST:  100 cc Isovue 300 intravenous COMPARISON:  06/02/2016 FINDINGS: Lower chest:  Coronary atherosclerosis.  No acute finding Hepatobiliary: No focal liver abnormality.Cholecystectomy. Normal common bile duct diameter. Pancreas: Unremarkable. Spleen: Unremarkable. Adrenals/Urinary Tract: Negative adrenals. Two nonobstructing right renal calculi. No hydronephrosis. Symmetric renal enhancement. Smooth symmetric thickened appearance of the bladder floor which is stable over priors and likely from prostate. Stomach/Bowel: Status post distal gastrectomy, reportedly for ulcers. The stomach contains semi solid-appearing material, also noted on the 3 most recent comparisons. The stomach wall is borderline thickened but no fat inflammation. There is newly seen fat edema in the small bowel mesenteries at the gastroenteric anastomosis. Lymph nodes are also focally prominent in this region. Mesenteric vessels are patent. No pneumatosis. No small bowel or colonic obstruction. Clips at the GE junction, presumed anti reflux surgery. Mild fullness along the medial wall of the mid duodenum, stable since at least September 22 2014 and presumably fold redundancy at the ampulla. Vascular/Lymphatic: No acute vascular  abnormality. Diffuse atheromatous calcifications. No mass or adenopathy. Reproductive:Prostatic calcification and mild enlargement that is similar to priors. Other: No ascites or  pneumoperitoneum. Musculoskeletal: No acute abnormalities. Spondylosis and lower lumbar facet arthropathy. IMPRESSION: 1. Mild mesenteric inflammation near the gastroenteric anastomosis. There is no visible small bowel thickening, but given focal appearance this could reflect marginal enteritis. 2. The gastric remnant is moderately distended with semi-solid, also noted on preceding comparison CTs. Correlate for chronic outlet obstruction. 3. Right nephrolithiasis. Electronically Signed   By: Monte Fantasia M.D.   On: 07/10/2016 11:03    Procedures .Critical Care Performed by: Duffy Bruce Authorized by: Duffy Bruce   Critical care provider statement:    Critical care time (minutes):  35  CRITICAL CARE Performed by: Evonnie Pat   Total critical care time: 35 minutes  Critical care time was exclusive of separately billable procedures and treating other patients.  Critical care was necessary to treat or prevent imminent or life-threatening deterioration.  Critical care was time spent personally by me on the following activities: development of treatment plan with patient and/or surrogate as well as nursing, discussions with consultants, evaluation of patient's response to treatment, examination of patient, obtaining history from patient or surrogate, ordering and performing treatments and interventions, ordering and review of laboratory studies, ordering and review of radiographic studies, pulse oximetry and re-evaluation of patient's condition.      (including critical care time)  Medications Ordered in ED Medications  ipratropium-albuterol (DUONEB) 0.5-2.5 (3) MG/3ML nebulizer solution 3 mL (not administered)  0.9 %  sodium chloride infusion (not administered)  pantoprazole (PROTONIX) EC tablet 40 mg (not administered)  metoprolol succinate (TOPROL-XL) 24 hr tablet 12.5 mg (not administered)  pravastatin (PRAVACHOL) tablet 40 mg (not administered)  flecainide (TAMBOCOR)  tablet 150 mg (not administered)  HYDROcodone-acetaminophen (NORCO/VICODIN) 5-325 MG per tablet 1 tablet (not administered)  insulin glargine (LANTUS) injection 8 Units (not administered)  diltiazem (CARDIZEM CD) 24 hr capsule 120 mg (not administered)  ferrous sulfate tablet 325 mg (not administered)  loratadine (CLARITIN) tablet 10 mg (not administered)  sodium chloride flush (NS) 0.9 % injection 3 mL (not administered)  acetaminophen (TYLENOL) tablet 650 mg (not administered)    Or  acetaminophen (TYLENOL) suppository 650 mg (not administered)  diclofenac sodium (VOLTAREN) 1 % transdermal gel 2 g (not administered)  nicotine (NICODERM CQ - dosed in mg/24 hr) patch 7 mg (not administered)  sodium chloride 0.9 % bolus 1,000 mL (0 mLs Intravenous Stopped 07/10/16 1106)  iopamidol (ISOVUE-300) 61 % injection (100 mLs Intravenous Contrast Given 07/10/16 1040)  pantoprazole (PROTONIX) 80 mg in sodium chloride 0.9 % 100 mL IVPB (80 mg Intravenous New Bag/Given 07/10/16 1339)  HYDROmorphone (DILAUDID) injection 0.5 mg (0.5 mg Intravenous Given 07/10/16 1309)  phytonadione (VITAMIN K) 5 mg in dextrose 5 % 50 mL IVPB (0 mg Intravenous Stopped 07/10/16 1340)     Initial Impression / Assessment and Plan / ED Course  I have reviewed the triage vital signs and the nursing notes.  Pertinent labs & imaging results that were available during my care of the patient were reviewed by me and considered in my medical decision making (see chart for details).    76 yo M with PMhx pAFib on coumadin, h/o gastric ulcer, h/o dCHF, DM2, CAD, COPD here with generalized weakness, cough, abdominal pain, and diarrhea. Lab work shows acute on chronic anemia, with Hgb <8 in pt with known heart disease, + fecal blood - will transfuse for goal Hgb>8, consult  GI as pt has h/o gastrectomy with marginal ulcers. PPI given. No evidence of HD instability. Labs o/w as above and reassuring. LA normal, doubt sepsis. CT scan with focal  thickening at gastroenteric anastomosis, but no perforation. Will admit for hydration, transfusion, possible scope. GI consulted in ED, will evaluate pt.   Final Clinical Impressions(s) / ED Diagnoses   Final diagnoses:  Upper GI bleed  Dehydration  Generalized weakness    New Prescriptions Current Discharge Medication List       Duffy Bruce, MD 07/10/16 1553

## 2016-07-10 NOTE — ED Notes (Signed)
Pt on bedside commode having loose BM ER MD aware

## 2016-07-10 NOTE — Consult Note (Addendum)
[facility] Consult: 1:01 PM 07/10/2016  LOS: 0 days    Referring Provider: Dr Ellender Hose in ED  Primary Care Physician:  [name], MD Primary Gastroenterologist:  unassigned    Reason for Consultation:  Anemia. FOBT positive.     HPI: Camdon Saetern is a 76 y.o. male.  PMH: non-occlusive CAD.  Diastolic CHF.  IDDM.  Chronic Coumadin for  Afib/flutter. s/p tricuspid isthmus ablation.  s/p cardioversion. Iron def anemia.  On oral iron.  Hx gastric ulcer and GIB leading to Billroth 2 in 1982.   Lap chole 2012   11/2013 Colonoscopy.  Dr Henrene Pastor.  For intermittent hematochezia on Coumadin.  Diminutive rectal polyp(tubular adenoma).  Mild left colon tics.  Internal hemorrhoids felt to be source of bleeding.   08/2014 EGD.  Dr Cristina Gong.  For IDA, FOBT +.  Billroth 2 anatomy, distal gastrectomy with double limb gastroenterostomy.  Erosion and mucosal hemorrhage at anastomosis, likely causing FOBT + anemia.   Retained food in stomach without GOO.  Worsening of chronic low back pain after a fall 8 weeks ago. He fell out of bed sustaining impact on his right hip. Hip pain, low back pain has persisted and hydrocodone is providing no relief.  Due to the chronic, more intense pain, he just generally feels unwell.  He felt a little bit dizzy yesterday but overall has not felt profound weakness.  Generally has stools at least once a day which are brown, formed. Appetite a bit reduced in the last several weeks, again he attributes this to the persistent pain. Has not had any nausea, vomiting.  No tarry, bloody stools.  No abdominal pain. No chest pain, no palpitations. No new dyspnea on exertion. No PND.  Because of constipation, the iron dose has never been increased beyond once daily but he is compliant with this.    Because of  the unrelenting pain patient presented to the emergency room today. INR is 5.2.  Hgb 7.9 (9.6 on 02/23/16).  BUN is not elevated.   Patient denies use of aspirin products and nonsteroidal anti-inflammatories. He has not used PPI for more than a year.  Past Medical History:  Diagnosis Date  . Anemia    takes Ferrous Sulfate daily  . Arthritis    "all over"  . Atrial flutter ([place])    a. 07/2010 Status post caval tricuspid isthmus ablation by Dr. Midge Aver Metoprolol daily  . Balance problem 01/2014  . CAP (community acquired pneumonia) 09/18/2014  . Cervical radiculopathy due to degenerative joint disease of spine   . COPD (chronic obstructive pulmonary disease) ([place])   . Coronary artery disease, non-occlusive    a. 03/2010 Nonocclusive disease by cath, performed for ST elevations on ECG;  b. 06/2013 Lexi MV: EF 60%, no ischemia.  . Diabetes mellitus type II    takes Metformin and Lantus daily  . Diastolic CHF, chronic ([place])    a. 12/2012 EF 55-60%, diast dysfxn, triv MR, mildly dil LA/RA.  [name] Dysrhythmia    HX OF ATRIAL IFB /FLUTTER takes Flecanide and Coumadin daily  .  History of blood transfusion 1982   "when I had stomach OR"  . History of bronchitis    1998  . History of gastric ulcer   . HTN (hypertension)    takes Diltiazem daily  . Hyperlipidemia    takes Pravastatin daily  . Joint pain   . PAF (paroxysmal atrial fibrillation) ([place])    a. Recurrent after atrial flutter, currently controlled on flecainide plus diltiazem  . Pneumonia 1999  . Weakness    numbness and tingling both hands    Past Surgical History:  Procedure Laterality Date  . ATRIAL ABLATION SURGERY  08/05/10   CTI ablation for atrial flutter by JA  . CARDIAC CATHETERIZATION  2012   nl LV function, no occlusive CAD, PAF  . CARDIOVERSION  12/07/2010    Successful direct current cardioversion with atrial fibrillation to normal sinus rhythm  . CARPAL TUNNEL RELEASE Bilateral 01/30/2014   Procedure:  BILATERAL CARPAL TUNNEL RELEASE;  Surgeon: Marianna Payment, MD;  Location: [place];  Service: Orthopedics;  Laterality: Bilateral;  . CATARACT EXTRACTION W/ INTRAOCULAR LENS  IMPLANT, BILATERAL Bilateral   . COLONOSCOPY [name] 12/02/2013   Procedure: COLONOSCOPY;  Surgeon: Irene Shipper, MD;  Location: [place];  Service: Endoscopy;  Laterality: [name];  . ESOPHAGOGASTRODUODENOSCOPY [name] 09/22/2014   Procedure: ESOPHAGOGASTRODUODENOSCOPY (EGD);  Surgeon: Ronald Lobo, MD;  Location: [facility] ENDOSCOPY;  Service: Endoscopy;  Laterality: [name];  . INCISION AND DRAINAGE ABSCESS / HEMATOMA OF BURSA / KNEE / THIGH Left 1998   knee  . KNEE BURSECTOMY Left 1998  . LAPAROSCOPIC CHOLECYSTECTOMY  03/2010  . NM MYOVIEW LTD  07/22/2013   Normal EF ~60%, no ischemia or infarction.  [name] PARTIAL GASTRECTOMY  1982   subtotal; "took out 30% for ulcers"  . TRANSTHORACIC ECHOCARDIOGRAM  02/16/2014   EF 60%, no RWMA. - otherwise normal  . YAG LASER APPLICATION Bilateral     Prior to Admission medications   Medication Sig Start Date End Date Taking? Authorizing Provider  acetaminophen (TYLENOL) 650 MG CR tablet Take 650 mg by mouth 2 (two) times daily as needed for pain.   Yes Historical Provider, MD  aspirin EC 81 MG tablet Take 81 mg by mouth daily.   Yes Historical Provider, MD  diltiazem (CARDIZEM CD) 120 MG 24 hr capsule Take 1 capsule (120 mg total) by mouth daily. 03/19/15  Yes Erlene Quan, PA-C  ferrous sulfate 325 (65 FE) MG tablet Take 325 mg by mouth daily with breakfast.   Yes Historical Provider, MD  flecainide (TAMBOCOR) 150 MG tablet TAKE 1 AND 1/2 TABLETS TWICE DAILY 12/20/15  Yes Leonie Man, MD  hydrocortisone cream 1 % Apply 1 application topically 2 (two) times daily as needed for itching.  04/27/15  Yes Historical Provider, MD  Insulin Glargine (LANTUS) 100 UNIT/ML Solostar Pen Inject 4 Units into the skin 4 (four) times daily as needed.    Yes Historical Provider, MD  loratadine (CLARITIN) 10 MG  tablet Take 10 mg by mouth every evening.    Yes Historical Provider, MD  metFORMIN (GLUCOPHAGE) 1000 MG tablet Take 1,000 mg by mouth 2 (two) times daily with a meal.    Yes Historical Provider, MD  metoprolol succinate (TOPROL-XL) 25 MG 24 hr tablet TAKE 1/2 TABLET DAILY. (NEED MD APPT FOR FURTHER REFILLS) 06/13/16  Yes Leonie Man, MD  Multiple Vitamin (MULTIVITAMIN WITH MINERALS) TABS tablet Take 1 tablet by mouth every evening.    Yes Historical Provider, MD  nitroGLYCERIN (  NITROSTAT) 0.4 MG SL tablet Place 1 tablet (0.4 mg total) under the tongue every 5 (five) minutes x 3 doses as needed for chest pain. 11/19/15  Yes Erlene Quan, PA-C  pravastatin (PRAVACHOL) 40 MG tablet TAKE 1 TABLET EVERY EVENING 04/25/16  Yes Leonie Man, MD  warfarin (COUMADIN) 5 MG tablet TAKE 1 TO 1 AND 1/2 TABLETS DAILY AS DIRECTED Patient taking differently: 7.5mg  Sat Lenna Sciara 5mg  Mon Wed Fri 03/24/16  Yes Leonie Man, MD  HYDROcodone-acetaminophen (NORCO/VICODIN) 5-325 MG tablet Take 0.5 tablets by mouth every 4 (four) hours as needed for severe pain. Patient not taking: Reported on 07/10/2016 4/65/03   Delora Fuel, MD  HYDROcodone-homatropine [facility]) 5-1.5 MG/5ML syrup Take 5 mLs by mouth every 6 (six) hours as needed for cough. Patient not taking: Reported on 07/10/2016 02/23/16   Charlesetta Shanks, MD    Scheduled Meds: .  HYDROmorphone (DILAUDID) injection  0.5 mg Intravenous Once  . ipratropium-albuterol  3 mL Nebulization Once   Infusions: . sodium chloride    . pantoprazole (PROTONIX) IV    . phytonadione (VITAMIN K) IV 5 mg (07/10/16 1229)   PRN Meds:    Allergies as of 07/10/2016  . (No Known Allergies)    Family History  Problem Relation Age of Onset  . Cancer Mother   . Heart attack Father   . Heart attack Brother     Social Hx: former smoker  REVIEW OF SYSTEMS: Constitutional:  Per HPI ENT:  No nose bleeds Pulm:  Per HPI CV:  No palpitations, no LE edema.  GU:   No hematuria, no frequency GI:  No dysphagia.  Cuts up solid food to small pieces due to having no teeth and inadequate mastication. Heme:  No unusual bleeding or bruising   Transfusions:  In 2016 Neuro:  No headaches, no peripheral tingling or numbness Derm:  No itching, no rash or sores.  Endocrine:  No sweats or chills.  No polyuria or dysuria Immunization:  Not queried. Travel:  None beyond local counties in last few months.    PHYSICAL EXAM: Vital signs in last 24 hours: Vitals:   07/10/16 1219 07/10/16 1230  BP: 126/64 (!) 137/57  Pulse:    Resp:    Temp:     Wt Readings from Last 3 Encounters:  07/10/16 83.9 kg (185 lb)  05/26/16 85.3 kg (188 lb)  11/19/15 82.9 kg (182 lb 12.8 oz)    General: Overweight, chronically ill appearing slightly uncomfortable elderly WM. Head:  No facial asymmetry. No signs of head trauma. No facial edema.  Eyes:  No scleral icterus. Conjunctiva pink. EOMI. Ears:  Slightly HOH.  Nose:  No discharge or congestion Mouth:  Edentulous. Oral mucosa moist, pink, clear. Tongue midline. Neck:  No JVD, no thyromegaly, no masses. Lungs:  Reduced breath sounds but clear bilaterally. No dyspnea or cough. Heart: RRR. No MRG. S1, S2 present Abdomen:  Soft. Not tender, not distended. Bowel sounds active. No HSM, masses, hernias..   Rectal: Deferred rectal exam as this was reported as brown, soft/formed and FOBT positive per day emergency room physician   Musc/Skeltl: No joint contracture deformities or swelling. Extremities:  No CCE.  Neurologic:  Patient is alert. Oriented times 3. No limb weakness, no tremor. Skin:  No telangiectasia, rashes, or sores. Tattoos:  None seen. Nodes:  No cervical adenopathy.   Psych:  Pleasant, cooperative, calm.  Intake/Output from previous day: No intake/output data recorded. Intake/Output this shift: Total I/O  In: 1000 [IV Piggyback:1000] Out: -   LAB RESULTS:  Recent Labs  07/10/16 0838  WBC 7.3  HGB 7.9*   HCT 28.6*  PLT 319   BMET Lab Results  Component Value Date   NA 134 (L) 07/10/2016   NA 137 02/23/2016   NA 137 10/22/2015   K 3.8 07/10/2016   K 4.0 02/23/2016   K 4.2 10/22/2015   CL 102 07/10/2016   CL 107 02/23/2016   CL 99 (L) 10/22/2015   CO2 22 07/10/2016   CO2 21 (L) 02/23/2016   CO2 28 09/06/2015   GLUCOSE 174 (H) 07/10/2016   GLUCOSE 146 (H) 02/23/2016   GLUCOSE 121 (H) 10/22/2015   BUN 11 07/10/2016   BUN 15 02/23/2016   BUN 16 10/22/2015   CREATININE 0.98 07/10/2016   CREATININE 0.85 02/23/2016   CREATININE 1.00 10/22/2015   CALCIUM 7.8 (L) 07/10/2016   CALCIUM 8.9 02/23/2016   CALCIUM 9.4 09/06/2015   LFT  Recent Labs  07/10/16 0838  PROT 5.6*  ALBUMIN 2.6*  AST 33  ALT 33  ALKPHOS 115  BILITOT 0.4   PT/INR Lab Results  Component Value Date   INR 5.20 (HH) 07/10/2016   INR 3.2 06/26/2016   INR 1.4 06/19/2016   Hepatitis Panel No results for input(s): HEPBSAG, HCVAB, HEPAIGM, HEPBIGM in the last 72 hours. C-Diff No components found for: CDIFF Lipase     Component Value Date/Time   LIPASE 22 09/06/2015 1253    Drugs of Abuse     Component Value Date/Time   LABOPIA NEGATIVE 03/24/2010 0350   COCAINSCRNUR NEGATIVE 03/24/2010 0350   LABBENZ NEGATIVE 03/24/2010 0350   AMPHETMU NEGATIVE 03/24/2010 0350     RADIOLOGY STUDIES: Dg Chest 2 View  Result Date: 07/10/2016 CLINICAL DATA:  Cough, shortness of breath. EXAM: CHEST  2 VIEW COMPARISON:  Radiographs of February 23, 2016. FINDINGS: The heart size and mediastinal contours are within normal limits. No consolidative process is noted. Rounded nodular density seen projected over right lower lobe. No pneumothorax or pleural effusion is noted. The visualized skeletal structures are unremarkable. IMPRESSION: Nodular rounded density seen projected over right lower lobe which may simply represent overlying nipple shadow, but pulmonary nodule cannot be excluded. Repeat radiograph nipple markers  is recommended. No other abnormality seen in the chest Electronically Signed   By: Marijo Conception, M.D.   On: 07/10/2016 10:27   Ct Abdomen Pelvis W Contrast  Result Date: 07/10/2016 CLINICAL DATA:  Fall out of bed landing on the left hip 3 weeks ago. Persistent pain. EXAM: CT ABDOMEN AND PELVIS WITH CONTRAST TECHNIQUE: Multidetector CT imaging of the abdomen and pelvis was performed using the standard protocol following bolus administration of intravenous contrast. CONTRAST:  100 cc Isovue 300 intravenous COMPARISON:  06/02/2016 FINDINGS: Lower chest:  Coronary atherosclerosis.  No acute finding Hepatobiliary: No focal liver abnormality.Cholecystectomy. Normal common bile duct diameter. Pancreas: Unremarkable. Spleen: Unremarkable. Adrenals/Urinary Tract: Negative adrenals. Two nonobstructing right renal calculi. No hydronephrosis. Symmetric renal enhancement. Smooth symmetric thickened appearance of the bladder floor which is stable over priors and likely from prostate. Stomach/Bowel: Status post distal gastrectomy, reportedly for ulcers. The stomach contains semi solid-appearing material, also noted on the 3 most recent comparisons. The stomach wall is borderline thickened but no fat inflammation. There is newly seen fat edema in the small bowel mesenteries at the gastroenteric anastomosis. Lymph nodes are also focally prominent in this region. Mesenteric vessels are patent. No pneumatosis. No small bowel  or colonic obstruction. Clips at the GE junction, presumed anti reflux surgery. Mild fullness along the medial wall of the mid duodenum, stable since at least September 22 2014 and presumably fold redundancy at the ampulla. Vascular/Lymphatic: No acute vascular abnormality. Diffuse atheromatous calcifications. No mass or adenopathy. Reproductive:Prostatic calcification and mild enlargement that is similar to priors. Other: No ascites or pneumoperitoneum. Musculoskeletal: No acute abnormalities. Spondylosis and  lower lumbar facet arthropathy. IMPRESSION: 1. Mild mesenteric inflammation near the gastroenteric anastomosis. There is no visible small bowel thickening, but given focal appearance this could reflect marginal enteritis. 2. The gastric remnant is moderately distended with semi-solid, also noted on preceding comparison CTs. Correlate for chronic outlet obstruction. 3. Right nephrolithiasis. Electronically Signed   By: Monte Fantasia M.D.   On: 07/10/2016 11:03     IMPRESSION:   *  Acute on chronic anemia.  Microcytic indices. Anastomotic ulcer on EGD in 2016, suspect this has led to slow but persistent GI bleeding especially since he's not been using PPI for at least one year.  On 81 ASA, coumadin, once daily oral iron chronically. Had small tubular adenomatous polyp on colonoscopy in 04/2013. Currently FOBT + brown stool.  There has been no melena or overt bleeding. IV Protonix 80 mg currently infusing along with the single, ordered PRBC.   *  Chronic Coumadin for a fib/flutte, CHADS-VASC 5.  Supratherapeutic INR.  Same on 06/16/16 when INR 7.0 but subsequently normalized.  s/p  Vitamin K 5mg  IV.       PLAN:     *  CBC and PT/INR in AM.  Clear diet now.  EGD tomorrow vs 4/18?[name]    *  Does not need PPI drip.  Ordered oral Protonix 40 mg twice a day to begin tomorrow. He is already in the midst of receiving 80 mg of IV Protonix at the time of this dictation.   Azucena Freed  07/10/2016, 1:01 PM Pager: [phone]  I have reviewed the entire case in detail with the above APP and discussed the plan in detail.  Therefore, I agree with the diagnoses recorded above. In addition,  I have personally interviewed and examined the patient and have personally reviewed any abdominal/pelvic CT scan images.  My additional thoughts are as follows:  CC: Iron deficiency anemia Heme positive stool Over-therapeutic on coumadin for Afib  This seems to be the same scenario as in 2016. He probably has an  anastomotic ulcer from aspirin, exacerbated by [facility].  However, I think the majority of his IDA is from poor absorption b/c has has had a Bilroth II gastrojejunostomy.  EGD 4/18 after INR below 2.0  Please do serial Hgb and Hct and transfuse to keep Hgb over 8.0 with his co-morbidities.  Then his PCP needs to keep a close eye on his Hgb and iron levels and periodically give him IV iron treatments.  Nelida Meuse III Pager [phone]  Mon-Fri 8a-5p [phone] after 5p, weekends, holidays

## 2016-07-10 NOTE — Evaluation (Signed)
Physical Therapy Evaluation Patient Details Name: [name] MRN: [id number] DOB: 1940-09-11 Today's Date: 07/10/2016   History of Present Illness  76 y.o. male w/ a h/o CHF, t2DM, HTN, PAF s/p ablation on flecanaide and warfarin, h/o PUD s/p gastrectomy w/ Billroth II who presented to the ED with several month h/o progressive generalized weakness which worsened over the last 3 weeks  in setting of GIB  Clinical Impression  Patient demonstrates deficits in functional mobility as indicated below. Will need continued skilled PT to address deficits and maximize function. Will see as indicated and progress as tolerated.     Follow Up Recommendations Home health PT;Supervision for mobility/OOB    Equipment Recommendations  Rolling walker with 5" wheels    Recommendations for Other Services       Precautions / Restrictions Precautions Precautions: Fall      Mobility  Bed Mobility Overal bed mobility: Needs Assistance Bed Mobility: Rolling;Sidelying to Sit;Sit to Sidelying Rolling: Supervision Sidelying to sit: Min assist     Sit to sidelying: Min guard General bed mobility comments: Min assist to power to upright position in sidelying. Patient guarded due to back pain  Transfers Overall transfer level: Needs assistance Equipment used: Rolling walker (2 wheeled) Transfers: Sit to/from Stand Sit to Stand: Min guard         General transfer comment: min guard with use of RW, some instability noted  Ambulation/Gait Ambulation/Gait assistance: Min assist Ambulation Distance (Feet): 120 Feet Assistive device: Rolling walker (2 wheeled) Gait Pattern/deviations: Step-through pattern;Decreased stride length;Drifts right/left;Trunk flexed;Narrow base of support Gait velocity: decreased Gait velocity interpretation: Below normal speed for age/gender General Gait Details: patient required min assist for control of RW and instability LOb during turns. Assisted patient with  positioning and safety. VCs for upright postures and environmental cues to avoid objects. patient easily distracted  Stairs            Wheelchair Mobility    Modified Rankin (Stroke Patients Only)       Balance Overall balance assessment: History of Falls                                           Pertinent Vitals/Pain Pain Assessment: 0-10 Pain Score: 7  Pain Location: back and leg Pain Descriptors / Indicators: Aching;Grimacing;Guarding;Sharp Pain Intervention(s): Monitored during session;Patient requesting pain meds-RN notified    Home Living Family/patient expects to be discharged to:: Private residence Living Arrangements: Children Available Help at Discharge: Family;Available PRN/intermittently Type of Home: Mobile home Home Access: Stairs to enter Entrance Stairs-Rails: Chemical engineer of Steps: 2 Home Layout: One level Home Equipment: None      Prior Function Level of Independence: Independent with assistive device(s)         Comments: uses a cane to get around [employer]     Hand Dominance   Dominant Hand: Right    Extremity/Trunk Assessment   Upper Extremity Assessment Upper Extremity Assessment: Overall WFL for tasks assessed    Lower Extremity Assessment Lower Extremity Assessment: Generalized weakness       Communication   Communication: HOH  Cognition Arousal/Alertness: Awake/alert Behavior During Therapy: WFL for tasks assessed/performed Overall Cognitive Status: No family/caregiver present to determine baseline cognitive functioning  General Comments      Exercises     Assessment/Plan    PT Assessment Patient needs continued PT services  PT Problem List Decreased strength;Decreased activity tolerance;Decreased balance;Decreased mobility;Decreased coordination;Decreased cognition;Decreased safety awareness;Pain       PT  Treatment Interventions DME instruction;Gait training;Stair training;Functional mobility training;Therapeutic activities;Therapeutic exercise;Balance training;Patient/family education    PT Goals (Current goals can be found in the Care Plan section)  Acute Rehab PT Goals Patient Stated Goal: to go home PT Goal Formulation: With patient Time For Goal Achievement: 07/24/16 Potential to Achieve Goals: Good    Frequency Min 3X/week   Barriers to discharge        Co-evaluation               End of Session Equipment Utilized During Treatment: Gait belt Activity Tolerance: Patient limited by pain Patient left: in bed;with call bell/phone within reach;with bed alarm set Nurse Communication: Mobility status PT Visit Diagnosis: Unsteadiness on feet (R26.81);Muscle weakness (generalized) (M62.81)    Time: 6734-1937 PT Time Calculation (min) (ACUTE ONLY): 19 min   Charges:   PT Evaluation $PT Eval Moderate Complexity: 1 Procedure     PT G Codes:        Alben Deeds, PT DPT  [phone]   Duncan Dull 07/10/2016, 6:08 PM

## 2016-07-10 NOTE — ED Triage Notes (Signed)
Pt has a hx of chronic pain pt fell a few weeks ago aggrevating his pain which has made the pain worse and left him unable to get comfortable and lose sleep which has left him feeling generally weak and unable to care for himself during the day when he is left home by himself

## 2016-07-10 NOTE — ED Notes (Signed)
Pt with large amount of watery diahhrrea stool sent off for hemocult ER MD aware

## 2016-07-11 ENCOUNTER — Inpatient Hospital Stay (HOSPITAL_COMMUNITY): Payer: Medicare Other | Admitting: Anesthesiology

## 2016-07-11 ENCOUNTER — Encounter (HOSPITAL_COMMUNITY)
Admission: EM | Disposition: A | Discharge status: Home / Self Care | Payer: Self-pay | Source: Home / Self Care | Attending: Internal Medicine

## 2016-07-11 ENCOUNTER — Encounter (HOSPITAL_COMMUNITY): Payer: Self-pay | Admitting: *Deleted

## 2016-07-11 DIAGNOSIS — Z7982 Long term (current) use of aspirin: Secondary | ICD-10-CM

## 2016-07-11 DIAGNOSIS — M25551 Pain in right hip: Secondary | ICD-10-CM

## 2016-07-11 DIAGNOSIS — I110 Hypertensive heart disease with heart failure: Secondary | ICD-10-CM

## 2016-07-11 DIAGNOSIS — Z809 Family history of malignant neoplasm, unspecified: Secondary | ICD-10-CM

## 2016-07-11 DIAGNOSIS — M549 Dorsalgia, unspecified: Secondary | ICD-10-CM

## 2016-07-11 DIAGNOSIS — K922 Gastrointestinal hemorrhage, unspecified: Secondary | ICD-10-CM

## 2016-07-11 DIAGNOSIS — M25511 Pain in right shoulder: Secondary | ICD-10-CM

## 2016-07-11 DIAGNOSIS — E785 Hyperlipidemia, unspecified: Secondary | ICD-10-CM

## 2016-07-11 DIAGNOSIS — Z8711 Personal history of peptic ulcer disease: Secondary | ICD-10-CM

## 2016-07-11 DIAGNOSIS — Z79899 Other long term (current) drug therapy: Secondary | ICD-10-CM

## 2016-07-11 DIAGNOSIS — I5032 Chronic diastolic (congestive) heart failure: Secondary | ICD-10-CM

## 2016-07-11 DIAGNOSIS — Z8249 Family history of ischemic heart disease and other diseases of the circulatory system: Secondary | ICD-10-CM

## 2016-07-11 DIAGNOSIS — R195 Other fecal abnormalities: Secondary | ICD-10-CM

## 2016-07-11 DIAGNOSIS — Z9889 Other specified postprocedural states: Secondary | ICD-10-CM

## 2016-07-11 DIAGNOSIS — R791 Abnormal coagulation profile: Secondary | ICD-10-CM

## 2016-07-11 DIAGNOSIS — E119 Type 2 diabetes mellitus without complications: Secondary | ICD-10-CM

## 2016-07-11 DIAGNOSIS — Z794 Long term (current) use of insulin: Secondary | ICD-10-CM

## 2016-07-11 DIAGNOSIS — I2510 Atherosclerotic heart disease of native coronary artery without angina pectoris: Secondary | ICD-10-CM

## 2016-07-11 HISTORY — PX: ESOPHAGOGASTRODUODENOSCOPY: SHX5428

## 2016-07-11 LAB — CBC
HCT: 28.9 % — ABNORMAL LOW (ref 39.0–52.0)
HCT: 32.3 % — ABNORMAL LOW (ref 39.0–52.0)
HEMOGLOBIN: 7.9 g/dL — AB (ref 13.0–17.0)
Hemoglobin: 9.4 g/dL — ABNORMAL LOW (ref 13.0–17.0)
MCH: 20.2 pg — ABNORMAL LOW (ref 26.0–34.0)
MCH: 21.8 pg — ABNORMAL LOW (ref 26.0–34.0)
MCHC: 27.3 g/dL — AB (ref 30.0–36.0)
MCHC: 29.1 g/dL — AB (ref 30.0–36.0)
MCV: 73.9 fL — ABNORMAL LOW (ref 78.0–100.0)
MCV: 74.9 fL — ABNORMAL LOW (ref 78.0–100.0)
PLATELETS: 295 10*3/uL (ref 150–400)
PLATELETS: 329 10*3/uL (ref 150–400)
RBC: 3.91 MIL/uL — ABNORMAL LOW (ref 4.22–5.81)
RBC: 4.31 MIL/uL (ref 4.22–5.81)
RDW: 19.5 % — AB (ref 11.5–15.5)
RDW: 19.1 % — ABNORMAL HIGH (ref 11.5–15.5)
WBC: 6.8 10*3/uL (ref 4.0–10.5)
WBC: 6.9 10*3/uL (ref 4.0–10.5)

## 2016-07-11 LAB — BASIC METABOLIC PANEL
Anion gap: 9 (ref 5–15)
BUN: 7 mg/dL (ref 6–20)
CALCIUM: 7.5 mg/dL — AB (ref 8.9–10.3)
CHLORIDE: 106 mmol/L (ref 101–111)
CO2: 20 mmol/L — ABNORMAL LOW (ref 22–32)
CREATININE: 0.9 mg/dL (ref 0.61–1.24)
Glucose, Bld: 164 mg/dL — ABNORMAL HIGH (ref 65–99)
Potassium: 3 mmol/L — ABNORMAL LOW (ref 3.5–5.1)
SODIUM: 135 mmol/L (ref 135–145)

## 2016-07-11 LAB — PROTIME-INR
INR: 1.4
PROTHROMBIN TIME: 17.3 s — AB (ref 11.4–15.2)

## 2016-07-11 LAB — PREPARE RBC (CROSSMATCH)

## 2016-07-11 LAB — GLUCOSE, CAPILLARY: GLUCOSE-CAPILLARY: 123 mg/dL — AB (ref 65–99)

## 2016-07-11 SURGERY — EGD (ESOPHAGOGASTRODUODENOSCOPY)
Anesthesia: Monitor Anesthesia Care

## 2016-07-11 MED ORDER — LACTATED RINGERS IV SOLN: INTRAVENOUS | Status: DC | PRN

## 2016-07-11 MED ORDER — PROPOFOL 10 MG/ML IV BOLUS
INTRAVENOUS | Status: DC | PRN
  Administered 2016-07-11: 10 mg via INTRAVENOUS

## 2016-07-11 MED ORDER — WARFARIN SODIUM 10 MG PO TABS
10.0000 mg | ORAL_TABLET | Freq: Once | ORAL | Status: AC
  Administered 2016-07-11: 10 mg via ORAL
  Filled 2016-07-11: qty 1

## 2016-07-11 MED ORDER — SODIUM CHLORIDE 0.9 % IV SOLN
25.0000 mg | Freq: Once | INTRAVENOUS | Status: AC
  Administered 2016-07-11: 25 mg via INTRAVENOUS
  Filled 2016-07-11: qty 0.5

## 2016-07-11 MED ORDER — PROPOFOL 500 MG/50ML IV EMUL
INTRAVENOUS | Status: DC | PRN
  Administered 2016-07-11: 100 ug/kg/min via INTRAVENOUS

## 2016-07-11 MED ORDER — POTASSIUM CHLORIDE CRYS ER 20 MEQ PO TBCR: 40.0000 meq | EXTENDED_RELEASE_TABLET | Freq: Once | ORAL | Status: DC

## 2016-07-11 MED ORDER — POTASSIUM CHLORIDE CRYS ER 20 MEQ PO TBCR
40.0000 meq | EXTENDED_RELEASE_TABLET | Freq: Two times a day (BID) | ORAL | Status: AC
  Administered 2016-07-11 – 2016-07-12 (×2): 40 meq via ORAL
  Filled 2016-07-11 (×2): qty 2

## 2016-07-11 MED ORDER — SODIUM CHLORIDE 0.9 % IV SOLN
Freq: Once | INTRAVENOUS | Status: AC
  Administered 2016-07-11: 09:00:00 via INTRAVENOUS

## 2016-07-11 MED ORDER — POTASSIUM CHLORIDE CRYS ER 20 MEQ PO TBCR
40.0000 meq | EXTENDED_RELEASE_TABLET | Freq: Once | ORAL | Status: AC
Start: 2016-07-11 — End: 2016-07-11
  Administered 2016-07-11: 40 meq via ORAL
  Filled 2016-07-11: qty 2

## 2016-07-11 MED ORDER — LACTATED RINGERS IV SOLN
INTRAVENOUS | Status: DC | PRN
  Administered 2016-07-11: 10:00:00 via INTRAVENOUS

## 2016-07-11 MED ORDER — SODIUM CHLORIDE 0.9 % IV SOLN
1000.0000 mg | Freq: Once | INTRAVENOUS | Status: AC
  Administered 2016-07-11: 1000 mg via INTRAVENOUS
  Filled 2016-07-11 (×2): qty 20

## 2016-07-11 MED ORDER — WARFARIN - PHARMACIST DOSING INPATIENT: Freq: Every day | Status: DC

## 2016-07-11 MED ORDER — POTASSIUM CHLORIDE CRYS ER 20 MEQ PO TBCR: 40.0000 meq | EXTENDED_RELEASE_TABLET | Freq: Two times a day (BID) | ORAL | Status: DC

## 2016-07-11 NOTE — Transfer of Care (Signed)
Immediate Anesthesia Transfer of Care Note  Patient: [name]  Procedure(s) Performed: Procedure(s): ESOPHAGOGASTRODUODENOSCOPY (EGD) ([name])  Patient Location: Endoscopy Unit  Anesthesia Type:MAC  Level of Consciousness: awake, alert  and oriented  Airway & Oxygen Therapy: Patient Spontanous Breathing and Patient connected to nasal cannula oxygen  Post-op Assessment: Report given to RN, Post -op Vital signs reviewed and stable and Patient moving all extremities  Post vital signs: Reviewed and stable  Last Vitals:  Vitals:   07/11/16 1046 07/11/16 1128  BP: (!) 130/96 (P) 134/69  Pulse: 83 (P) 83  Resp: 20 (P) 12  Temp: 36.7 C (P) 36.6 C    Last Pain:  Vitals:   07/11/16 1128  TempSrc: (P) Oral  PainSc:       Patients Stated Pain Goal: 3 (17/49/44 9675)  Complications: No apparent anesthesia complications

## 2016-07-11 NOTE — Anesthesia Procedure Notes (Signed)
Procedure Name: Intubation Date/Time: 07/11/2016 11:16 AM Performed by: Rush Farmer E Pre-anesthesia Checklist: Patient identified, Emergency Drugs available, Suction available and Patient being monitored Patient Re-evaluated:Patient Re-evaluated prior to inductionOxygen Delivery Method: Nasal cannula Intubation Type: IV induction Placement Confirmation: positive ETCO2

## 2016-07-11 NOTE — H&P (View-Only) (Signed)
[facility] Consult: 1:01 PM 07/10/2016  LOS: 0 days    Referring Provider: Dr Ellender Hose in ED  Primary Care Physician:  [name], MD Primary Gastroenterologist:  unassigned    Reason for Consultation:  Anemia. FOBT positive.     HPI: [name] is a 76 y.o. male.  PMH: non-occlusive CAD.  Diastolic CHF.  IDDM.  Chronic Coumadin for  Afib/flutter. s/p tricuspid isthmus ablation.  s/p cardioversion. Iron def anemia.  On oral iron.  Hx gastric ulcer and GIB leading to Billroth 2 in 1982.   Lap chole 2012   11/2013 Colonoscopy.  Dr Henrene Pastor.  For intermittent hematochezia on Coumadin.  Diminutive rectal polyp(tubular adenoma).  Mild left colon tics.  Internal hemorrhoids felt to be source of bleeding.   08/2014 EGD.  Dr Cristina Gong.  For IDA, FOBT +.  Billroth 2 anatomy, distal gastrectomy with double limb gastroenterostomy.  Erosion and mucosal hemorrhage at anastomosis, likely causing FOBT + anemia.   Retained food in stomach without GOO.  Worsening of chronic low back pain after a fall 8 weeks ago. He fell out of bed sustaining impact on his right hip. Hip pain, low back pain has persisted and hydrocodone is providing no relief.  Due to the chronic, more intense pain, he just generally feels unwell.  He felt a little bit dizzy yesterday but overall has not felt profound weakness.  Generally has stools at least once a day which are brown, formed. Appetite a bit reduced in the last several weeks, again he attributes this to the persistent pain. Has not had any nausea, vomiting.  No tarry, bloody stools.  No abdominal pain. No chest pain, no palpitations. No new dyspnea on exertion. No PND.  Because of constipation, the iron dose has never been increased beyond once daily but he is compliant with this.    Because of  the unrelenting pain patient presented to the emergency room today. INR is 5.2.  Hgb 7.9 (9.6 on 02/23/16).  BUN is not elevated.   Patient denies use of aspirin products and nonsteroidal anti-inflammatories. He has not used PPI for more than a year.  Past Medical History:  Diagnosis Date  . Anemia    takes Ferrous Sulfate daily  . Arthritis    "all over"  . Atrial flutter ([place])    a. 07/2010 Status post caval tricuspid isthmus ablation by Dr. Midge Aver Metoprolol daily  . Balance problem 01/2014  . CAP (community acquired pneumonia) 09/18/2014  . Cervical radiculopathy due to degenerative joint disease of spine   . COPD (chronic obstructive pulmonary disease) ([place])   . Coronary artery disease, non-occlusive    a. 03/2010 Nonocclusive disease by cath, performed for ST elevations on ECG;  b. 06/2013 Lexi MV: EF 60%, no ischemia.  . Diabetes mellitus type II    takes Metformin and Lantus daily  . Diastolic CHF, chronic ([place])    a. 12/2012 EF 55-60%, diast dysfxn, triv MR, mildly dil LA/RA.  [name] Dysrhythmia    HX OF ATRIAL IFB /FLUTTER takes Flecanide and Coumadin daily  .  History of blood transfusion 1982   "when I had stomach OR"  . History of bronchitis    1998  . History of gastric ulcer   . HTN (hypertension)    takes Diltiazem daily  . Hyperlipidemia    takes Pravastatin daily  . Joint pain   . PAF (paroxysmal atrial fibrillation) ([place])    a. Recurrent after atrial flutter, currently controlled on flecainide plus diltiazem  . Pneumonia 1999  . Weakness    numbness and tingling both hands    Past Surgical History:  Procedure Laterality Date  . ATRIAL ABLATION SURGERY  08/05/10   CTI ablation for atrial flutter by JA  . CARDIAC CATHETERIZATION  2012   nl LV function, no occlusive CAD, PAF  . CARDIOVERSION  12/07/2010    Successful direct current cardioversion with atrial fibrillation to normal sinus rhythm  . CARPAL TUNNEL RELEASE Bilateral 01/30/2014   Procedure:  BILATERAL CARPAL TUNNEL RELEASE;  Surgeon: Marianna Payment, MD;  Location: [facility];  Service: Orthopedics;  Laterality: Bilateral;  . CATARACT EXTRACTION W/ INTRAOCULAR LENS  IMPLANT, BILATERAL Bilateral   . COLONOSCOPY [name] 12/02/2013   Procedure: COLONOSCOPY;  Surgeon: Irene Shipper, MD;  Location: [facility];  Service: Endoscopy;  Laterality: [name];  . ESOPHAGOGASTRODUODENOSCOPY [name] 09/22/2014   Procedure: ESOPHAGOGASTRODUODENOSCOPY (EGD);  Surgeon: Ronald Lobo, MD;  Location: [facility] ENDOSCOPY;  Service: Endoscopy;  Laterality: [name];  . INCISION AND DRAINAGE ABSCESS / HEMATOMA OF BURSA / KNEE / THIGH Left 1998   knee  . KNEE BURSECTOMY Left 1998  . LAPAROSCOPIC CHOLECYSTECTOMY  03/2010  . NM MYOVIEW LTD  07/22/2013   Normal EF ~60%, no ischemia or infarction.  [name] PARTIAL GASTRECTOMY  1982   subtotal; "took out 30% for ulcers"  . TRANSTHORACIC ECHOCARDIOGRAM  02/16/2014   EF 60%, no RWMA. - otherwise normal  . YAG LASER APPLICATION Bilateral     Prior to Admission medications   Medication Sig Start Date End Date Taking? Authorizing Provider  acetaminophen (TYLENOL) 650 MG CR tablet Take 650 mg by mouth 2 (two) times daily as needed for pain.   Yes Historical Provider, MD  aspirin EC 81 MG tablet Take 81 mg by mouth daily.   Yes Historical Provider, MD  diltiazem (CARDIZEM CD) 120 MG 24 hr capsule Take 1 capsule (120 mg total) by mouth daily. 03/19/15  Yes Erlene Quan, PA-C  ferrous sulfate 325 (65 FE) MG tablet Take 325 mg by mouth daily with breakfast.   Yes Historical Provider, MD  flecainide (TAMBOCOR) 150 MG tablet TAKE 1 AND 1/2 TABLETS TWICE DAILY 12/20/15  Yes Leonie Man, MD  hydrocortisone cream 1 % Apply 1 application topically 2 (two) times daily as needed for itching.  04/27/15  Yes Historical Provider, MD  Insulin Glargine (LANTUS) 100 UNIT/ML Solostar Pen Inject 4 Units into the skin 4 (four) times daily as needed.    Yes Historical Provider, MD  loratadine (CLARITIN) 10 MG  tablet Take 10 mg by mouth every evening.    Yes Historical Provider, MD  metFORMIN (GLUCOPHAGE) 1000 MG tablet Take 1,000 mg by mouth 2 (two) times daily with a meal.    Yes Historical Provider, MD  metoprolol succinate (TOPROL-XL) 25 MG 24 hr tablet TAKE 1/2 TABLET DAILY. (NEED MD APPT FOR FURTHER REFILLS) 06/13/16  Yes Leonie Man, MD  Multiple Vitamin (MULTIVITAMIN WITH MINERALS) TABS tablet Take 1 tablet by mouth every evening.    Yes Historical Provider, MD  nitroGLYCERIN (  NITROSTAT) 0.4 MG SL tablet Place 1 tablet (0.4 mg total) under the tongue every 5 (five) minutes x 3 doses as needed for chest pain. 11/19/15  Yes Erlene Quan, PA-C  pravastatin (PRAVACHOL) 40 MG tablet TAKE 1 TABLET EVERY EVENING 04/25/16  Yes Leonie Man, MD  warfarin (COUMADIN) 5 MG tablet TAKE 1 TO 1 AND 1/2 TABLETS DAILY AS DIRECTED Patient taking differently: 7.5mg  Sat Sun Tues Thur 5mg  Jory Sims Fri 03/24/16  Yes Leonie Man, MD  HYDROcodone-acetaminophen (NORCO/VICODIN) 5-325 MG tablet Take 0.5 tablets by mouth every 4 (four) hours as needed for severe pain. Patient not taking: Reported on 07/10/2016 7/82/95   Delora Fuel, MD  HYDROcodone-homatropine [facility]) 5-1.5 MG/5ML syrup Take 5 mLs by mouth every 6 (six) hours as needed for cough. Patient not taking: Reported on 07/10/2016 02/23/16   Charlesetta Shanks, MD    Scheduled Meds: .  HYDROmorphone (DILAUDID) injection  0.5 mg Intravenous Once  . ipratropium-albuterol  3 mL Nebulization Once   Infusions: . sodium chloride    . pantoprazole (PROTONIX) IV    . phytonadione (VITAMIN K) IV 5 mg (07/10/16 1229)   PRN Meds:    Allergies as of 07/10/2016  . (No Known Allergies)    Family History  Problem Relation Age of Onset  . Cancer Mother   . Heart attack Father   . Heart attack Brother     Social Hx: former smoker  REVIEW OF SYSTEMS: Constitutional:  Per HPI ENT:  No nose bleeds Pulm:  Per HPI CV:  No palpitations, no LE edema.  GU:   No hematuria, no frequency GI:  No dysphagia.  Cuts up solid food to small pieces due to having no teeth and inadequate mastication. Heme:  No unusual bleeding or bruising   Transfusions:  In 2016 Neuro:  No headaches, no peripheral tingling or numbness Derm:  No itching, no rash or sores.  Endocrine:  No sweats or chills.  No polyuria or dysuria Immunization:  Not queried. Travel:  None beyond local counties in last few months.    PHYSICAL EXAM: Vital signs in last 24 hours: Vitals:   07/10/16 1219 07/10/16 1230  BP: 126/64 (!) 137/57  Pulse:    Resp:    Temp:     Wt Readings from Last 3 Encounters:  07/10/16 83.9 kg (185 lb)  05/26/16 85.3 kg (188 lb)  11/19/15 82.9 kg (182 lb 12.8 oz)    General: Overweight, chronically ill appearing slightly uncomfortable elderly WM. Head:  No facial asymmetry. No signs of head trauma. No facial edema.  Eyes:  No scleral icterus. Conjunctiva pink. EOMI. Ears:  Slightly HOH.  Nose:  No discharge or congestion Mouth:  Edentulous. Oral mucosa moist, pink, clear. Tongue midline. Neck:  No JVD, no thyromegaly, no masses. Lungs:  Reduced breath sounds but clear bilaterally. No dyspnea or cough. Heart: RRR. No MRG. S1, S2 present Abdomen:  Soft. Not tender, not distended. Bowel sounds active. No HSM, masses, hernias..   Rectal: Deferred rectal exam as this was reported as brown, soft/formed and FOBT positive per day emergency room physician   Musc/Skeltl: No joint contracture deformities or swelling. Extremities:  No CCE.  Neurologic:  Patient is alert. Oriented times 3. No limb weakness, no tremor. Skin:  No telangiectasia, rashes, or sores. Tattoos:  None seen. Nodes:  No cervical adenopathy.   Psych:  Pleasant, cooperative, calm.  Intake/Output from previous day: No intake/output data recorded. Intake/Output this shift: Total I/O  In: 1000 [IV Piggyback:1000] Out: -   LAB RESULTS:  Recent Labs  07/10/16 0838  WBC 7.3  HGB 7.9*   HCT 28.6*  PLT 319   BMET Lab Results  Component Value Date   NA 134 (L) 07/10/2016   NA 137 02/23/2016   NA 137 10/22/2015   K 3.8 07/10/2016   K 4.0 02/23/2016   K 4.2 10/22/2015   CL 102 07/10/2016   CL 107 02/23/2016   CL 99 (L) 10/22/2015   CO2 22 07/10/2016   CO2 21 (L) 02/23/2016   CO2 28 09/06/2015   GLUCOSE 174 (H) 07/10/2016   GLUCOSE 146 (H) 02/23/2016   GLUCOSE 121 (H) 10/22/2015   BUN 11 07/10/2016   BUN 15 02/23/2016   BUN 16 10/22/2015   CREATININE 0.98 07/10/2016   CREATININE 0.85 02/23/2016   CREATININE 1.00 10/22/2015   CALCIUM 7.8 (L) 07/10/2016   CALCIUM 8.9 02/23/2016   CALCIUM 9.4 09/06/2015   LFT  Recent Labs  07/10/16 0838  PROT 5.6*  ALBUMIN 2.6*  AST 33  ALT 33  ALKPHOS 115  BILITOT 0.4   PT/INR Lab Results  Component Value Date   INR 5.20 (HH) 07/10/2016   INR 3.2 06/26/2016   INR 1.4 06/19/2016   Hepatitis Panel No results for input(s): HEPBSAG, HCVAB, HEPAIGM, HEPBIGM in the last 72 hours. C-Diff No components found for: CDIFF Lipase     Component Value Date/Time   LIPASE 22 09/06/2015 1253    Drugs of Abuse     Component Value Date/Time   LABOPIA NEGATIVE 03/24/2010 0350   COCAINSCRNUR NEGATIVE 03/24/2010 0350   LABBENZ NEGATIVE 03/24/2010 0350   AMPHETMU NEGATIVE 03/24/2010 0350     RADIOLOGY STUDIES: Dg Chest 2 View  Result Date: 07/10/2016 CLINICAL DATA:  Cough, shortness of breath. EXAM: CHEST  2 VIEW COMPARISON:  Radiographs of February 23, 2016. FINDINGS: The heart size and mediastinal contours are within normal limits. No consolidative process is noted. Rounded nodular density seen projected over right lower lobe. No pneumothorax or pleural effusion is noted. The visualized skeletal structures are unremarkable. IMPRESSION: Nodular rounded density seen projected over right lower lobe which may simply represent overlying nipple shadow, but pulmonary nodule cannot be excluded. Repeat radiograph nipple markers  is recommended. No other abnormality seen in the chest Electronically Signed   By: Marijo Conception, M.D.   On: 07/10/2016 10:27   Ct Abdomen Pelvis W Contrast  Result Date: 07/10/2016 CLINICAL DATA:  Fall out of bed landing on the left hip 3 weeks ago. Persistent pain. EXAM: CT ABDOMEN AND PELVIS WITH CONTRAST TECHNIQUE: Multidetector CT imaging of the abdomen and pelvis was performed using the standard protocol following bolus administration of intravenous contrast. CONTRAST:  100 cc Isovue 300 intravenous COMPARISON:  06/02/2016 FINDINGS: Lower chest:  Coronary atherosclerosis.  No acute finding Hepatobiliary: No focal liver abnormality.Cholecystectomy. Normal common bile duct diameter. Pancreas: Unremarkable. Spleen: Unremarkable. Adrenals/Urinary Tract: Negative adrenals. Two nonobstructing right renal calculi. No hydronephrosis. Symmetric renal enhancement. Smooth symmetric thickened appearance of the bladder floor which is stable over priors and likely from prostate. Stomach/Bowel: Status post distal gastrectomy, reportedly for ulcers. The stomach contains semi solid-appearing material, also noted on the 3 most recent comparisons. The stomach wall is borderline thickened but no fat inflammation. There is newly seen fat edema in the small bowel mesenteries at the gastroenteric anastomosis. Lymph nodes are also focally prominent in this region. Mesenteric vessels are patent. No pneumatosis. No small bowel  or colonic obstruction. Clips at the GE junction, presumed anti reflux surgery. Mild fullness along the medial wall of the mid duodenum, stable since at least September 22 2014 and presumably fold redundancy at the ampulla. Vascular/Lymphatic: No acute vascular abnormality. Diffuse atheromatous calcifications. No mass or adenopathy. Reproductive:Prostatic calcification and mild enlargement that is similar to priors. Other: No ascites or pneumoperitoneum. Musculoskeletal: No acute abnormalities. Spondylosis and  lower lumbar facet arthropathy. IMPRESSION: 1. Mild mesenteric inflammation near the gastroenteric anastomosis. There is no visible small bowel thickening, but given focal appearance this could reflect marginal enteritis. 2. The gastric remnant is moderately distended with semi-solid, also noted on preceding comparison CTs. Correlate for chronic outlet obstruction. 3. Right nephrolithiasis. Electronically Signed   By: Monte Fantasia M.D.   On: 07/10/2016 11:03     IMPRESSION:   *  Acute on chronic anemia.  Microcytic indices. Anastomotic ulcer on EGD in 2016, suspect this has led to slow but persistent GI bleeding especially since he's not been using PPI for at least one year.  On 81 ASA, coumadin, once daily oral iron chronically. Had small tubular adenomatous polyp on colonoscopy in 04/2013. Currently FOBT + brown stool.  There has been no melena or overt bleeding. IV Protonix 80 mg currently infusing along with the single, ordered PRBC.   *  Chronic Coumadin for a fib/flutte, CHADS-VASC 5.  Supratherapeutic INR.  Same on 06/16/16 when INR 7.0 but subsequently normalized.  s/p  Vitamin K 5mg  IV.       PLAN:     *  CBC and PT/INR in AM.  Clear diet now.  EGD tomorrow vs 4/18?[name]    *  Does not need PPI drip.  Ordered oral Protonix 40 mg twice a day to begin tomorrow. He is already in the midst of receiving 80 mg of IV Protonix at the time of this dictation.   Azucena Freed  07/10/2016, 1:01 PM Pager: [phone]  I have reviewed the entire case in detail with the above APP and discussed the plan in detail.  Therefore, I agree with the diagnoses recorded above. In addition,  I have personally interviewed and examined the patient and have personally reviewed any abdominal/pelvic CT scan images.  My additional thoughts are as follows:  CC: Iron deficiency anemia Heme positive stool Over-therapeutic on coumadin for Afib  This seems to be the same scenario as in 2016. He probably has an  anastomotic ulcer from aspirin, exacerbated by [facility].  However, I think the majority of his IDA is from poor absorption b/c has has had a Bilroth II gastrojejunostomy.  EGD 4/18 after INR below 2.0  Please do serial Hgb and Hct and transfuse to keep Hgb over 8.0 with his co-morbidities.  Then his PCP needs to keep a close eye on his Hgb and iron levels and periodically give him IV iron treatments.  Nelida Meuse III Pager [phone]  Mon-Fri 8a-5p [phone] after 5p, weekends, holidays

## 2016-07-11 NOTE — Anesthesia Preprocedure Evaluation (Signed)
Anesthesia Evaluation  Patient identified by MRN, date of birth, ID band Patient awake    Reviewed: Allergy & Precautions, NPO status , Patient's Chart, lab work & pertinent test results, reviewed documented beta blocker date and time   History of Anesthesia Complications Negative for: history of anesthetic complications  Airway Mallampati: I  TM Distance: >3 FB Neck ROM: Full    Dental  (+) Edentulous Upper, Edentulous Lower   Pulmonary COPD, Current Smoker,    breath sounds clear to auscultation       Cardiovascular hypertension, Pt. on medications and Pt. on home beta blockers + CAD (non-obstructive)  + dysrhythmias (s/p ablation) Atrial Fibrillation  Rhythm:Irregular Rate:Normal  '16 ECHO:  EF 50-55%, valves OK '12 cath: Nonocclusive disease '15 Lexi MV: EF 60%, no ischemia.   Neuro/Psych negative neurological ROS     GI/Hepatic negative GI ROS, Neg liver ROS, PUD,   Endo/Other  diabetes (glu 123), Insulin Dependent  Renal/GU negative Renal ROS     Musculoskeletal  (+) Arthritis , Osteoarthritis,    Abdominal   Peds  Hematology  (+) Blood dyscrasia (Hb 7.9, Coumadin: INR 1.40), anemia ,   Anesthesia Other Findings   Reproductive/Obstetrics                             Anesthesia Physical Anesthesia Plan  ASA: III  Anesthesia Plan: MAC   Post-op Pain Management:    Induction: Intravenous  Airway Management Planned: Natural Airway and Nasal Cannula  Additional Equipment:   Intra-op Plan:   Post-operative Plan:   Informed Consent: I have reviewed the patients History and Physical, chart, labs and discussed the procedure including the risks, benefits and alternatives for the proposed anesthesia with the patient or authorized representative who has indicated his/her understanding and acceptance.     Plan Discussed with: CRNA and Surgeon  Anesthesia Plan Comments: (Plan  routine monitors, MAC)        Anesthesia Quick Evaluation

## 2016-07-11 NOTE — Interval H&P Note (Signed)
History and Physical Interval Note:  07/11/2016 11:08 AM  [name]  has presented today for surgery, with the diagnosis of anemia and FOBT +  The various methods of treatment have been discussed with the patient and family. After consideration of risks, benefits and other options for treatment, the patient has consented to  Procedure(s): ESOPHAGOGASTRODUODENOSCOPY (EGD) ([name]) as a surgical intervention .  The patient's history has been reviewed, patient examined, no change in status, stable for surgery.  I have reviewed the patient's chart and labs.  Questions were answered to the patient's satisfaction.     Nelida Meuse III

## 2016-07-11 NOTE — Anesthesia Postprocedure Evaluation (Addendum)
Anesthesia Post Note  Patient: [name]  Procedure(s) Performed: Procedure(s) (LRB): ESOPHAGOGASTRODUODENOSCOPY (EGD) ([name])  Patient location during evaluation: Endoscopy Anesthesia Type: MAC Level of consciousness: oriented, awake and alert and patient cooperative Pain management: pain level controlled Vital Signs Assessment: post-procedure vital signs reviewed and stable Respiratory status: spontaneous breathing, nonlabored ventilation, respiratory function stable and patient connected to nasal cannula oxygen Cardiovascular status: blood pressure returned to baseline and stable Postop Assessment: no signs of nausea or vomiting Anesthetic complications: no       Last Vitals:  Vitals:   07/11/16 1128 07/11/16 1138  BP: 134/69 (!) 133/53  Pulse: 83 80  Resp: 12 20  Temp: 36.6 C 36.6 C    Last Pain:  Vitals:   07/11/16 1138  TempSrc: Oral  PainSc:                  Kenise Barraco,E. Akeria Hedstrom

## 2016-07-11 NOTE — Evaluation (Signed)
Occupational Therapy Evaluation Patient Details Name: [name] MRN: [id number] DOB: Jun 29, 1940 Today's Date: 07/11/2016    History of Present Illness 76 y.o. male w/ a h/o CHF, t2DM, HTN, PAF s/p ablation on flecanaide and warfarin, h/o PUD s/p gastrectomy w/ Billroth II who presented to the ED with several month h/o progressive generalized weakness which worsened over the last 3 weeks  in setting of GIB   Clinical Impression   Pt admitted with progressive weakness. Pt currently with functional limitations due to the deficits listed below (see OT Problem List).  Pt will benefit from skilled OT to increase their safety and independence with ADL and functional mobility for ADL to facilitate discharge to venue listed below.      Follow Up Recommendations  No OT follow up    Equipment Recommendations  None recommended by OT       Precautions / Restrictions Precautions Precautions: Fall      Mobility Bed Mobility Overal bed mobility: Needs Assistance Bed Mobility: Rolling;Sidelying to Sit;Sit to Sidelying Rolling: Supervision Sidelying to sit: Mod assist     Sit to sidelying: Mod assist General bed mobility comments: mod A  Transfers Overall transfer level: Needs assistance Equipment used: Rolling walker (2 wheeled) Transfers: Sit to/from Stand Sit to Stand: Mod assist         General transfer comment: min guard with use of RW, some instability noted    Balance Overall balance assessment: History of Falls                                         ADL either performed or assessed with clinical judgement   ADL Overall ADL's : Needs assistance/impaired Eating/Feeding: Sitting;Set up   Grooming: Sitting;Wash/dry face;Set up   Upper Body Bathing: Sitting;Minimal assistance   Lower Body Bathing: Sit to/from stand;Moderate assistance;Cueing for safety   Upper Body Dressing : Minimal assistance;Sitting   Lower Body Dressing: Moderate  assistance;Sit to/from stand;Cueing for safety Lower Body Dressing Details (indicate cue type and reason): pt may benefit from [place] and Hygiene: Moderate assistance;Sit to/from stand;Cueing for safety;Cueing for sequencing         General ADL Comments: pt in pain this morning.  Pt did agree to OOB, standing EOB and then needed to return to bed.  RN notified of pts pain     Vision Baseline Vision/History: Wears glasses              Pertinent Vitals/Pain Pain Score: 8  Pain Descriptors / Indicators: Guarding;Constant Pain Intervention(s): Monitored during session;Repositioned;Patient requesting pain meds-RN notified     Hand Dominance Right   Extremity/Trunk Assessment Upper Extremity Assessment Upper Extremity Assessment: Overall WFL for tasks assessed           Communication Communication Communication: HOH   Cognition Arousal/Alertness: Awake/alert Behavior During Therapy: WFL for tasks assessed/performed Overall Cognitive Status: No family/caregiver present to determine baseline cognitive functioning                                                Home Living Family/patient expects to be discharged to:: Private residence Living Arrangements: Children Available Help at Discharge: Family;Available PRN/intermittently Type of Home: Mobile home Home Access: Stairs to enter Entrance  Stairs-Number of Steps: 2 Entrance Stairs-Rails: Left;Right Home Layout: One level     Bathroom Shower/Tub: Teacher, early years/pre: Standard     Home Equipment: None          Prior Functioning/Environment Level of Independence: Independent with assistive device(s)        Comments: uses a cane to get around [employer]        OT Problem List: Decreased strength;Decreased activity tolerance;Decreased safety awareness;Impaired balance (sitting and/or standing);Pain      OT Treatment/Interventions:  Self-care/ADL training;DME and/or AE instruction;Patient/family education    OT Goals(Current goals can be found in the care plan section) Acute Rehab OT Goals Patient Stated Goal: to go home ADL Goals Pt Will Perform Grooming: with supervision;standing Pt Will Perform Lower Body Dressing: with supervision;sit to/from stand Pt Will Transfer to Toilet: with supervision;ambulating Pt Will Perform Toileting - Clothing Manipulation and hygiene: sit to/from stand;with supervision Pt Will Perform Tub/Shower Transfer: with supervision;rolling walker;shower seat;ambulating  OT Frequency: Min 2X/week           End of Session Equipment Utilized During Treatment: Surveyor, mining Communication: Mobility status  Activity Tolerance: Patient limited by pain Patient left: in chair  OT Visit Diagnosis: Unsteadiness on feet (R26.81);Muscle weakness (generalized) (M62.81);History of falling (Z91.81);Pain                Time: 5993-5701 OT Time Calculation (min): 14 min Charges:  OT General Charges $OT Visit: 1 Procedure OT Evaluation $OT Eval Moderate Complexity: 1 Procedure G-Codes:     Kari Baars, OT [phone]  Payton Mccallum D 07/11/2016, 9:41 AM

## 2016-07-11 NOTE — Progress Notes (Signed)
PT Cancellation Note  Patient Details Name: Mac Dowdell MRN: [id number] DOB: [birth year]   Cancelled Treatment:    Reason Eval/Treat Not Completed: Patient at procedure or test/unavailable (Pt in EGD)   [place] 07/11/2016, 11:23 AM Suanne Marker PT [phone]

## 2016-07-11 NOTE — Progress Notes (Signed)
Notified IMTS DR. Lovena Le regarding patient passing loose stools 4x.MD Stated will pass it on to am to incoming MD what they are going to do about it.no further orders recieved

## 2016-07-11 NOTE — Progress Notes (Signed)
Please see my EGD note and recommendations.  Coumadin can be resumed, but ideally INR 2-3.  See need for outpatient management of hgb and iron levels  GI signing off - call as needed.

## 2016-07-11 NOTE — Progress Notes (Signed)
ANTICOAGULATION CONSULT NOTE - Initial Consult  Pharmacy Consult:  Coumadin Indication: atrial fibrillation  No Known Allergies  Patient Measurements: Height: 5\' 3"  (160 cm) Weight: 173 lb 9.6 oz (78.7 kg) IBW/kg (Calculated) : 56.9  Vital Signs: Temp: 98 F (36.7 C) (04/17 1223) Temp Source: Oral (04/17 1223) BP: 129/60 (04/17 1223) Pulse Rate: 80 (04/17 1223)  Labs:  Recent Labs  07/10/16 0838 07/10/16 1802 07/11/16 0339  HGB 7.9* 8.1* 7.9*  HCT 28.6* 29.1* 28.9*  PLT 319  --  295  LABPROT 49.4*  --  17.3*  INR 5.20*  --  1.40  CREATININE 0.98  --  0.90    Estimated Creatinine Clearance: 63.8 mL/min (by C-G formula based on SCr of 0.9 mg/dL).   Medical History: Past Medical History:  Diagnosis Date  . Anemia    takes Ferrous Sulfate daily  . Arthritis    "all over"  . Atrial flutter ([place])    a. 07/2010 Status post caval tricuspid isthmus ablation by Dr. Midge Aver Metoprolol daily  . Balance problem 01/2014  . CAP (community acquired pneumonia) 09/18/2014  . Cervical radiculopathy due to degenerative joint disease of spine   . COPD (chronic obstructive pulmonary disease) ([place])   . Coronary artery disease, non-occlusive    a. 03/2010 Nonocclusive disease by cath, performed for ST elevations on ECG;  b. 06/2013 Lexi MV: EF 60%, no ischemia.  . Diabetes mellitus type II    takes Metformin and Lantus daily  . Diastolic CHF, chronic ([place])    a. 12/2012 EF 55-60%, diast dysfxn, triv MR, mildly dil LA/RA.  [name] Dysrhythmia    HX OF ATRIAL IFB /FLUTTER takes Flecanide and Coumadin daily  . History of blood transfusion 1982   "when I had stomach OR"  . History of bronchitis    1998  . History of gastric ulcer   . HTN (hypertension)    takes Diltiazem daily  . Hyperlipidemia    takes Pravastatin daily  . Joint pain   . PAF (paroxysmal atrial fibrillation) ([place])    a. Recurrent after atrial flutter, currently controlled on flecainide plus diltiazem  . Pneumonia  1999  . Weakness    numbness and tingling both hands      Assessment: 33 YOM with history of Afib/Aflutter on Coumadin PTA.  He presented with symptomatic anemia so Coumadin was held and reversed with Vitamin K 5mg  IV x 1 on 07/10/16.  Now s/p EGD that is negative for acute bleed and Pharmacy consulted to resume Coumadin.  INR currently sub-therapeutic at 1.4 and expect a slow rise given Vitamin K administration.  Noted order for Infed.  Home Coumadin regimen:  7.5mg  daily except 5mg  MWF   Goal of Therapy:  INR 2-3    Plan:  - Coumadin 10mg  PO today - Daily PT / INR - Monitor closely for s/sx of bleeding    Ikhlas Albo D. Mina Marble, PharmD, BCPS Pager:  [phone] 07/11/2016, 1:19 PM

## 2016-07-11 NOTE — Op Note (Signed)
[facility] Patient Name: [name] Procedure Date : 07/11/2016 MRN: [id number] Attending MD: Estill Cotta. Loletha Carrow , MD Date of Birth: 1940-05-20 CSN: [id number] Age: 76 Admit Type: Inpatient Procedure:                Upper GI endoscopy Indications:              Iron deficiency anemia, Heme positive stool Providers:                Mallie Mussel L. Loletha Carrow, MD, Burtis Junes, RN, Alfonso Patten,                            Technician, Rhae Lerner, CRNA Referring MD:              Medicines:                Monitored Anesthesia Care Complications:            No immediate complications. Estimated Blood Loss:     Estimated blood loss: none. Procedure:                Pre-Anesthesia Assessment:                           - Prior to the procedure, a History and Physical                            was performed, and patient medications and                            allergies were reviewed. The patient's tolerance of                            previous anesthesia was also reviewed. The risks                            and benefits of the procedure and the sedation                            options and risks were discussed with the patient.                            All questions were answered, and informed consent                            was obtained. Prior Anticoagulants: The patient has                            taken aspirin, last dose was 2 days prior to                            procedure. ASA Grade Assessment: III - A patient                            with severe systemic disease. After reviewing the  risks and benefits, the patient was deemed in                            satisfactory condition to undergo the procedure.                           After obtaining informed consent, the endoscope was                            passed under direct vision. Throughout the                            procedure, the patient's blood pressure, pulse, and              oxygen saturations were monitored continuously. The                            EG-[id number] ([id number]) scope was introduced through the                            mouth, and advanced to the afferent and efferent                            jejunal loops. The upper GI endoscopy was                            accomplished without difficulty. The patient                            tolerated the procedure well. Scope In: Scope Out: Findings:      Moderately severe bile-stained distal esophagitis with no bleeding was       found.      A large amount of bile-stained food (residue) was found in the entire       examined stomach. This significantly limited visualization in the       stomach.      Evidence of a patent Billroth II gastrojejunostomy was found. The       gastrojejunal anastomosis was characterized by extensive linear       ulceration and friability. This was traversed. The efferent limb was       examined and was normal. The afferent limb was examined and was normal. Impression:               - Moderately severe reflux esophagitis.                           - A large amount of food (residue) in the stomach.                           - Patent Billroth II gastrojejunostomy was found,                            characterized by ulceration.                           - No specimens  collected. Moderate Sedation:      MAC sedation used Recommendation:           - Low fiber diet indefinitely.                           - Continue present medications, including twice                            daily PPI.                           - Resume Coumadin (warfarin) at prior dose today.                           - GI would prefer that this patient not resume                            aspirin if possible.                           - PLEASE SEE GI CONSULT NOTE - THIS PATIENT NEEDS                            CLOSE FOLLOW UP WITH PRIMARY CARE OR HEMATOLOGY TO                            FOLLOW  HEMOGLOBIN AND IRON LEVELS AS WELL AS MANAGE                            IV IRON TREATMENTS. Procedure Code(s):        --- Professional ---                           [phone], Esophagogastroduodenoscopy, flexible,                            transoral; diagnostic, including collection of                            specimen(s) by brushing or washing, when performed                            (separate procedure) Diagnosis Code(s):        --- Professional ---                           K21.0, Gastro-esophageal reflux disease with                            esophagitis                           Z98.0, Intestinal bypass and anastomosis status                           D50.9, Iron deficiency anemia, unspecified  R19.5, Other fecal abnormalities CPT copyright 2016 American Medical Association. All rights reserved. The codes documented in this report are preliminary and upon coder review may  be revised to meet current compliance requirements. Dashanna Kinnamon L. Loletha Carrow, MD 07/11/2016 11:44:56 AM This report has been signed electronically. Number of Addenda: 0

## 2016-07-11 NOTE — Progress Notes (Signed)
Patient returned from endoscopy and is awake and oriented x4.  Patient has no complaints at this time.  VSS. Will continue to monitor.

## 2016-07-11 NOTE — Discharge Instructions (Signed)

## 2016-07-11 NOTE — H&P (Signed)
Internal Medicine Attending Admission Note  I saw and evaluated the patient. I reviewed the resident's note and I agree with the resident's findings and plan as documented in the resident's note.  Assessment & Plan by Problem:   Symptomatic anemia from Chronic GI blood loss in setting of supratheraputic INR - EGD today with Dr Pincus Sanes shows patent anastomosis site however with ulcerations.  GI recommends D/C of ASA if able. - Given history of Bilroth II procedure will not be able to absorb oral iron adequately.  Will need CBC trended chronically and likely need intermittent IV iron.  He has received 2 units of pRBC this visit.  We will also give him 1 dose of IV ferraheme 510mg  prior to discharge. -Will need to remain on BID PPI. -Transfuse for INR <8.    Atrial flutter - status post CTI ablation -Warfarin, with goal INR 2-3 - No need for bridging -Flecainide 150mg  BID    Essential hypertension - diltiazem 120mg  daily -Troprol-XL 12.5mg  daily    Chronic diastolic congestive heart failure (HCC) - continue BB, appears euvolemic, monitor with transfusions    Coronary artery disease, non-occlusive - At home was on Aspirin 81mg  and Pravastatin,  discontinue ASA for now (although he also has previous evidence of lacunar stroke on CT scan).  Can discuss resuming with Dr Leitha Bleak PCP at their next visit.    Hyperlipidemia - Pravastatin    Type 2 diabetes mellitus ([place]) - odd dosing of lantus reported at home (4 units QID), Care everywhere shows PCP instructions are Lantus 16 units daily. -Continue Lantus 8 units +SSI, CBG currently at goal   Chief Complaint(s):fatigue  History - key components related to admission: Briefly [name] is a 76 year old male with a past medical history of A. fib/A flutter status post ablation, chronic anticoagulation, peptic ulcer disease status post Billroth II, iron deficiency anemia, hypertension, type 2 diabetes, chronic diastolic CHF, cervical  radiculopathy, hyperlipidemia.  He presented to the emergency department with fatigue as well as some right shoulder and hip pain following a fall a few months ago. He has not seen any of his physicians recently due to financial and transportation.  He reports gradual onset of fatigue, weakness, as well as mild shortness of breath on exertion. This is similar to a hospitalization in 2016 in which he required a blood transfusion.  He denies melena or hematochezia. He denies any abdominal pain. He has been taking his oral iron pill. He does not take any over-the-counter nonsteroidal anti-inflammatories. He does take 81 mg of aspirin daily. In the ED he was found to be anemic with a hemoglobin of 7.8 and a supratherapeutic INR of 5.2. A point-of-care FOBT was positive for heme and he was given vitamin K for reversal of that does not appear that he had an acute GI bleed.  Lab results: Reviewed in Epic  EKG: NSR with 1st degree AV block  Physical Exam - key components related to admission: General: resting in bed, frail HEENT: EOMI, no scleral icterus Cardiac: RRR Pulm: clear to auscultation bilaterally, moving normal volumes of air Abd: soft, nontender, nondistended, BS present Ext: warm and well perfused, no pedal edema MSK: muscular tenderness of right trapezius   Vitals:   07/11/16 1145 07/11/16 1155 07/11/16 1205 07/11/16 1223  BP: 118/73 133/63 (!) 142/51 129/60  Pulse: 84 83 79 80  Resp: (!) 23 15 14 18   Temp:    98 F (36.7 C)  TempSrc:    Oral  SpO2: 99% 98% 99% 99%  Weight:      Height:

## 2016-07-11 NOTE — Progress Notes (Signed)
Subjective: Currently, the patient is feeling marginally better. Several episodes of brown diarrhea yesterday and ON. Scratchy throat with dry cough. No SOB. Afebrile. No CP.  Interval Events: s/p 1U transfusion w/ inappropriate Hgb response. EGD this AM with esophagitis/ulceration of anastomosis.  Objective: Vital signs in last 24 hours: Vitals:   07/11/16 1145 07/11/16 1155 07/11/16 1205 07/11/16 1223  BP: 118/73 133/63 (!) 142/51 129/60  Pulse: 84 83 79 80  Resp: (!) 23 15 14 18   Temp:    98 F (36.7 C)  TempSrc:    Oral  SpO2: 99% 98% 99% 99%  Weight:      Height:       Physical Exam: Physical Exam  Constitutional: He is oriented to person, place, and time. No distress.  Cardiovascular: Normal rate and regular rhythm.   Murmur heard. Pulmonary/Chest: Effort normal and breath sounds normal.  Abdominal: Soft. Bowel sounds are normal. There is no tenderness.  Musculoskeletal: He exhibits no edema.  Neurological: He is alert and oriented to person, place, and time.  Skin: There is pallor.   Labs: CBC:  Recent Labs Lab 07/10/16 0838 07/10/16 1802 07/11/16 0339  WBC 7.3  --  6.8  HGB 7.9* 8.1* 7.9*  HCT 28.6* 29.1* 28.9*  MCV 73.5*  --  73.9*  PLT 319  --  233   Metabolic Panel:  Recent Labs Lab 07/10/16 0838 07/11/16 0339  NA 134* 135  K 3.8 3.0*  CL 102 106  CO2 22 20*  GLUCOSE 174* 164*  BUN 11 7  CREATININE 0.98 0.90  CALCIUM 7.8* 7.5*  ALT 33  --   ALKPHOS 115  --   BILITOT 0.4  --   PROT 5.6*  --   ALBUMIN 2.6*  --   LABPROT 49.4* 17.3*  INR 5.20* 1.40   Cardiac Labs:  Recent Labs Lab 07/10/16 0910  TROPIPOC 0.00   BG:  Recent Labs Lab 07/10/16 2209 07/11/16 0747  GLUCAP 144* 123*   Lab Results  Component Value Date   HGBA1C 6.0 (H) 05/16/2015    Medications: Infusions: . iron dextran (INFED/DEXFERRUM) infusion     Followed by  . iron dextran (INFED/DEXFERRUM) infusion     Scheduled Medications: . diclofenac sodium  2  g Topical QID  . diltiazem  120 mg Oral Daily  . ferrous sulfate  325 mg Oral Q breakfast  . flecainide  150 mg Oral Q12H  . insulin glargine  8 Units Subcutaneous Q2200  . ipratropium-albuterol  3 mL Nebulization Once  . loratadine  10 mg Oral QPM  . metoprolol succinate  12.5 mg Oral Daily  . nicotine  7 mg Transdermal Daily  . pantoprazole  40 mg Oral BID  . potassium chloride  40 mEq Oral BID WC  . pravastatin  40 mg Oral QPM  . sodium chloride flush  3 mL Intravenous Q12H  . warfarin  10 mg Oral ONCE-1800  . Warfarin - Pharmacist Dosing Inpatient   Does not apply q1800   PRN Medications: acetaminophen **OR** acetaminophen, HYDROcodone-acetaminophen  Assessment/Plan: Pt is a 76 y.o. male w/ a h/o CHF, t2DM, HTN, PAF s/p ablation on flecanaide and warfarin, h/o PUD s/p gastrectomy w/ Billroth II who presented to the ED with several month h/o progressive generalized weakness which worsened over the last 3 weeks, found to have anemia to 7.9 and positive FOBT. Now s/p EGD w/ signs of anastomosis ulcer and esophagitis.  Symptomatic anemia: GIB: s/p EGD with signs of  ulceration around gastrojejunal anastomosis and esophagitis. Will need chronic PPI therapy and close supervision of iron supplementation for BL anemia. s/p 1U pRBC in ED w/ inappropriate response from 7.9 to 7.9 this AM, now s/p 2nd U pRBC. - PPI PO 40mg  BID - transfuse 1000mg  IV Fe-dextran - DC w/ oral Fe and close f/u - trend CBC in PM / AM  Diarrhea / Acute viral illness: Pt with several episodes of loose brown stool yesterday and ON. No melana/hematochezia. Suspect enteritis given CT A/P findings of SB inflammation. Pt also w/ some upper airway irritation and dry cough. Could be viral process. Afebrile w/o leukocytosis. Continue to monitor, supportive care.  Shoulder pain / hip pain / back pain: Pt had fall 3 weeks ago with no traumatic fractures. h/o vertebral disc herniation. Pain complaints are what prompted pt  to present to the ED. Reports that he has a pain doctor who prescribes his medication, but [place] reports only one fill of #15 Vicodin from his PCP 1 month ago. - Norco 5-325mg  q4h - Voltaren gel PRN  DM: Pt has metformin + Lantus 16U daily home meds. Last A1c 6.0 04/2015. Would not favor tight control d/t concern of hypoglycemia. Hold metformin. - Lantus 8U qHS + SSI-s qAC  PAF: Supratherapeutic INR: NSR on flecainide, dilt, metop. INR 1.4 today after Vit K reversal. Restart warfarin per GI. - 10mg  coumadin today - daily PT/INR - continue home meds for rhythm/rate control  Length of Stay: 1 day(s) Dispo: Anticipated discharge tomorrow.  Holley Raring, MD Pager: [phone] (7AM-5PM) 07/11/2016, 1:37 PM

## 2016-07-11 NOTE — Progress Notes (Signed)
Transitions of Care Pharmacy Note  Plan:  Educated on importance of adherence to PPI and PO Iron outpatient, educated on increase of lantus to 8 units daily. Reviewed indications, adverse effects of each medication. Addressed concerns regarding how to obtain OTC iron tablets, coumadin dosing Outpatient follow-up - keep coumadin monitoring appointment for Friday --------------------------------------------- [name] is an 76 y.o. male who presents with a chief complaint symptomatic anemia. In anticipation of discharge, pharmacy has reviewed this patient's prior to admission medication history, as well as current inpatient medications listed per the [facility].  Current medication indications, dosing, frequency, and notable side effects reviewed with patient. patient verbalized understanding of current inpatient medication regimen and is aware that the After Visit Summary when presented, will represent the most accurate medication list at discharge.   [name] expressed concerns regarding how to obtain iron tablets. No other concerns expressed.    Assessment: Understanding of regimen: fair Understanding of indications: fair Potential of compliance: fair Barriers to Obtaining Medications: No  Patient instructed to contact inpatient pharmacy team with further questions or concerns if needed.    Time spent preparing for discharge counseling: 10 mins Time spent counseling patient: 30 mins   Thank you for allowing pharmacy to be a part of this patient's care.  Carlean Jews, Pharm.D. PGY1 Pharmacy Resident 4/17/20186:20 PM Pager [phone]

## 2016-07-11 NOTE — Progress Notes (Signed)
Daily Rounding Note  07/11/2016, 9:54 AM  LOS: 1 day   SUBJECTIVE:   Chief complaint: hip pain.  Stools are brown.  Feels ok      OBJECTIVE:         Vital signs in last 24 hours:    Temp:  [97.5 F (36.4 C)-98.9 F (37.2 C)] 97.5 F (36.4 C) (04/17 0914) Pulse Rate:  [72-92] 89 (04/17 0914) Resp:  [18-22] 18 (04/17 0914) BP: (98-137)/(42-88) 116/42 (04/17 0914) SpO2:  [97 %-100 %] 99 % (04/17 0914) Weight:  [78.7 kg (173 lb 9.6 oz)-78.9 kg (173 lb 14.4 oz)] 78.7 kg (173 lb 9.6 oz) (04/17 0025) Last BM Date: 07/10/16 Filed Weights   07/10/16 0833 07/10/16 1539 07/11/16 0025  Weight: 83.9 kg (185 lb) 78.9 kg (173 lb 14.4 oz) 78.7 kg (173 lb 9.6 oz)   General: pleasant.  Uncomfortable.  Not acutely ill looking but overall looks unwell.    Heart: RRR Chest: clear bil.  No cough or labored breathing Abdomen: soft, NT.  ND.  Active BS  Extremities: no CCE.  Feet warm.   Neuro/Psych:  Pleasant, alert, oriented x 3.  No tremor.  Moves all 4 limbs.   Intake/Output from previous day: 04/16 0701 - 04/17 0700 In: 1810 [P.O.:480; Blood:280; IV Piggyback:1050] Out: 400 [Urine:400]  Intake/Output this shift: Total I/O In: 3 [I.V.:3] Out: 200 [Urine:200]  Lab Results:  Recent Labs  07/10/16 0838 07/10/16 1802 07/11/16 0339  WBC 7.3  --  6.8  HGB 7.9* 8.1* 7.9*  HCT 28.6* 29.1* 28.9*  PLT 319  --  295   BMET  Recent Labs  07/10/16 0838 07/11/16 0339  NA 134* 135  K 3.8 3.0*  CL 102 106  CO2 22 20*  GLUCOSE 174* 164*  BUN 11 7  CREATININE 0.98 0.90  CALCIUM 7.8* 7.5*   LFT  Recent Labs  07/10/16 0838  PROT 5.6*  ALBUMIN 2.6*  AST 33  ALT 33  ALKPHOS 115  BILITOT 0.4   PT/INR  Recent Labs  07/10/16 0838 07/11/16 0339  LABPROT 49.4* 17.3*  INR 5.20* 1.40   Hepatitis Panel No results for input(s): HEPBSAG, HCVAB, HEPAIGM, HEPBIGM in the last 72 hours.  Studies/Results: Dg Chest 2  View  Result Date: 07/10/2016 CLINICAL DATA:  Pulmonary nodule. EXAM: CHEST  2 VIEW with nipple markers COMPARISON:  07/10/2016 at 10:13 a.m. and CT scan of the chest dated 12/19/2011 FINDINGS: Exam demonstrates at the nodule seen on chest x-ray at the right lung base does not represent a nipple shadow but does represent a granuloma, which correlates with a granuloma in that area present on prior chest CT dated 12/19/2011. This granuloma appears slightly larger than on the prior study but this is not felt to be worrisome. There other small nodules demonstrated in the lungs on the prior CT scan as well. IMPRESSION: No active cardiopulmonary disease. The nodule noted at the right lung base on the prior exam is felt represent a granuloma. Electronically Signed   By: Lorriane Shire M.D.   On: 07/10/2016 17:23   Dg Chest 2 View  Result Date: 07/10/2016 CLINICAL DATA:  Cough, shortness of breath. EXAM: CHEST  2 VIEW COMPARISON:  Radiographs of February 23, 2016. FINDINGS: The heart size and mediastinal contours are within normal limits. No consolidative process is noted. Rounded nodular density seen projected over right lower lobe. No pneumothorax or pleural effusion is noted. The visualized  skeletal structures are unremarkable. IMPRESSION: Nodular rounded density seen projected over right lower lobe which may simply represent overlying nipple shadow, but pulmonary nodule cannot be excluded. Repeat radiograph nipple markers is recommended. No other abnormality seen in the chest Electronically Signed   By: Marijo Conception, M.D.   On: 07/10/2016 10:27   Ct Abdomen Pelvis W Contrast  Result Date: 07/10/2016 CLINICAL DATA:  Fall out of bed landing on the left hip 3 weeks ago. Persistent pain. EXAM: CT ABDOMEN AND PELVIS WITH CONTRAST TECHNIQUE: Multidetector CT imaging of the abdomen and pelvis was performed using the standard protocol following bolus administration of intravenous contrast. CONTRAST:  100 cc Isovue  300 intravenous COMPARISON:  06/02/2016 FINDINGS: Lower chest:  Coronary atherosclerosis.  No acute finding Hepatobiliary: No focal liver abnormality.Cholecystectomy. Normal common bile duct diameter. Pancreas: Unremarkable. Spleen: Unremarkable. Adrenals/Urinary Tract: Negative adrenals. Two nonobstructing right renal calculi. No hydronephrosis. Symmetric renal enhancement. Smooth symmetric thickened appearance of the bladder floor which is stable over priors and likely from prostate. Stomach/Bowel: Status post distal gastrectomy, reportedly for ulcers. The stomach contains semi solid-appearing material, also noted on the 3 most recent comparisons. The stomach wall is borderline thickened but no fat inflammation. There is newly seen fat edema in the small bowel mesenteries at the gastroenteric anastomosis. Lymph nodes are also focally prominent in this region. Mesenteric vessels are patent. No pneumatosis. No small bowel or colonic obstruction. Clips at the GE junction, presumed anti reflux surgery. Mild fullness along the medial wall of the mid duodenum, stable since at least September 22 2014 and presumably fold redundancy at the ampulla. Vascular/Lymphatic: No acute vascular abnormality. Diffuse atheromatous calcifications. No mass or adenopathy. Reproductive:Prostatic calcification and mild enlargement that is similar to priors. Other: No ascites or pneumoperitoneum. Musculoskeletal: No acute abnormalities. Spondylosis and lower lumbar facet arthropathy. IMPRESSION: 1. Mild mesenteric inflammation near the gastroenteric anastomosis. There is no visible small bowel thickening, but given focal appearance this could reflect marginal enteritis. 2. The gastric remnant is moderately distended with semi-solid, also noted on preceding comparison CTs. Correlate for chronic outlet obstruction. 3. Right nephrolithiasis. Electronically Signed   By: Monte Fantasia M.D.   On: 07/10/2016 11:03    ASSESMENT:   *  Anemia.   Brown FOBT + stool.  Hx anastomotic ulcer at [facility] anastomosis 08/2014.  Bilroth 2 in 1982. s/p PRBC x 1.  Post transfusion Hgb 7.9 >> 7.9 this AM.  Another unit of blood transfusing now.  Microcytic.  *  Chronic Coumadin for afib/flutter.  INR 5 >> 1/4 post IV vitamin K on 4/16.    *  Hypokalemia. Oral supplement given this AM.    PLAN   *  Aiming for EGD this afternoon., otherwise will do this tomorrow.  He is NPO.    Azucena Freed  07/11/2016, 9:54 AM Pager: [phone]

## 2016-07-12 ENCOUNTER — Encounter (HOSPITAL_COMMUNITY): Payer: Self-pay | Admitting: Gastroenterology

## 2016-07-12 ENCOUNTER — Emergency Department (HOSPITAL_COMMUNITY)
Admission: EM | Admit: 2016-07-12 | Discharge: 2016-07-12 | Disposition: A | Discharge status: Home / Self Care | Payer: Medicare Other | Attending: Emergency Medicine | Admitting: Emergency Medicine

## 2016-07-12 DIAGNOSIS — M545 Low back pain, unspecified: Secondary | ICD-10-CM

## 2016-07-12 DIAGNOSIS — I110 Hypertensive heart disease with heart failure: Secondary | ICD-10-CM | POA: Insufficient documentation

## 2016-07-12 DIAGNOSIS — Z8673 Personal history of transient ischemic attack (TIA), and cerebral infarction without residual deficits: Secondary | ICD-10-CM | POA: Insufficient documentation

## 2016-07-12 DIAGNOSIS — I2510 Atherosclerotic heart disease of native coronary artery without angina pectoris: Secondary | ICD-10-CM | POA: Insufficient documentation

## 2016-07-12 DIAGNOSIS — E119 Type 2 diabetes mellitus without complications: Secondary | ICD-10-CM | POA: Diagnosis not present

## 2016-07-12 DIAGNOSIS — G8929 Other chronic pain: Secondary | ICD-10-CM | POA: Diagnosis not present

## 2016-07-12 DIAGNOSIS — Z7901 Long term (current) use of anticoagulants: Secondary | ICD-10-CM | POA: Insufficient documentation

## 2016-07-12 DIAGNOSIS — I5032 Chronic diastolic (congestive) heart failure: Secondary | ICD-10-CM | POA: Insufficient documentation

## 2016-07-12 DIAGNOSIS — Z85048 Personal history of other malignant neoplasm of rectum, rectosigmoid junction, and anus: Secondary | ICD-10-CM | POA: Insufficient documentation

## 2016-07-12 DIAGNOSIS — Z794 Long term (current) use of insulin: Secondary | ICD-10-CM | POA: Insufficient documentation

## 2016-07-12 DIAGNOSIS — I480 Paroxysmal atrial fibrillation: Secondary | ICD-10-CM

## 2016-07-12 DIAGNOSIS — R51 Headache: Secondary | ICD-10-CM | POA: Insufficient documentation

## 2016-07-12 DIAGNOSIS — J449 Chronic obstructive pulmonary disease, unspecified: Secondary | ICD-10-CM | POA: Insufficient documentation

## 2016-07-12 DIAGNOSIS — Z79899 Other long term (current) drug therapy: Secondary | ICD-10-CM | POA: Insufficient documentation

## 2016-07-12 DIAGNOSIS — R519 Headache, unspecified: Secondary | ICD-10-CM

## 2016-07-12 DIAGNOSIS — F17210 Nicotine dependence, cigarettes, uncomplicated: Secondary | ICD-10-CM | POA: Insufficient documentation

## 2016-07-12 LAB — BASIC METABOLIC PANEL
ANION GAP: 5 (ref 5–15)
BUN: 6 mg/dL (ref 6–20)
CALCIUM: 8.2 mg/dL — AB (ref 8.9–10.3)
CHLORIDE: 107 mmol/L (ref 101–111)
CO2: 25 mmol/L (ref 22–32)
CREATININE: 0.93 mg/dL (ref 0.61–1.24)
GFR calc non Af Amer: >60 mL/min (ref >60)
Glucose, Bld: 139 mg/dL — ABNORMAL HIGH (ref 65–99)
Potassium: 4.1 mmol/L (ref 3.5–5.1)
SODIUM: 137 mmol/L (ref 135–145)

## 2016-07-12 LAB — TYPE AND SCREEN
ABO/RH(D): A NEG
ANTIBODY SCREEN: NEGATIVE
Unit division: 0
Unit division: 0

## 2016-07-12 LAB — BPAM RBC
BLOOD PRODUCT EXPIRATION DATE: 201804252359
BLOOD PRODUCT EXPIRATION DATE: 201804262359
ISSUE DATE / TIME: 201804170847
ISSUE DATE / TIME: 201804161255
UNIT TYPE AND RH: 600
UNIT TYPE AND RH: 600

## 2016-07-12 LAB — CBC
HEMATOCRIT: 32.5 % — AB (ref 39.0–52.0)
HEMOGLOBIN: 9.3 g/dL — AB (ref 13.0–17.0)
MCH: 21.6 pg — ABNORMAL LOW (ref 26.0–34.0)
MCHC: 28.6 g/dL — ABNORMAL LOW (ref 30.0–36.0)
MCV: 75.4 fL — ABNORMAL LOW (ref 78.0–100.0)
Platelets: 345 10*3/uL (ref 150–400)
RBC: 4.31 MIL/uL (ref 4.22–5.81)
RDW: 19.1 % — ABNORMAL HIGH (ref 11.5–15.5)
WBC: 7.4 10*3/uL (ref 4.0–10.5)

## 2016-07-12 LAB — PROTIME-INR
INR: 1.19
PROTHROMBIN TIME: 15.2 s (ref 11.4–15.2)

## 2016-07-12 MED ORDER — PANTOPRAZOLE SODIUM 40 MG PO TBEC: 40.0000 mg | DELAYED_RELEASE_TABLET | Freq: Two times a day (BID) | ORAL | 0 refills | Status: DC

## 2016-07-12 MED ORDER — WARFARIN SODIUM 5 MG PO TABS: 10.0000 mg | ORAL_TABLET | Freq: Every day | ORAL | 1 refills | Status: DC

## 2016-07-12 MED ORDER — WARFARIN SODIUM 10 MG PO TABS: 10.0000 mg | ORAL_TABLET | Freq: Once | ORAL | Status: DC

## 2016-07-12 MED ORDER — DICLOFENAC SODIUM 1 % TD GEL: 2.0000 g | Freq: Four times a day (QID) | TRANSDERMAL | 0 refills | Status: DC

## 2016-07-12 MED ORDER — FLECAINIDE ACETATE 150 MG PO TABS: 150.0000 mg | ORAL_TABLET | Freq: Two times a day (BID) | ORAL | 3 refills | Status: DC

## 2016-07-12 MED ORDER — IBUPROFEN 200 MG PO TABS: 400.0000 mg | ORAL_TABLET | Freq: Three times a day (TID) | ORAL | Status: DC | PRN

## 2016-07-12 MED ORDER — INSULIN GLARGINE 100 UNIT/ML SOLOSTAR PEN: 8.0000 [IU] | PEN_INJECTOR | Freq: Every day | SUBCUTANEOUS | 11 refills | Status: DC

## 2016-07-12 NOTE — Discharge Summary (Signed)
Name: [name] MRN: [id number] DOB: 1940-09-02 76 y.o. PCP: Bernerd Limbo, MD  Date of Admission: 07/10/2016  8:23 AM Date of Discharge: 07/12/2016 Attending Physician: Lucious Groves, DO  Discharge Diagnosis: Principal Problem:   Symptomatic anemia Active Problems:   Atrial flutter - status post CTI ablation   Essential hypertension   Chronic diastolic congestive heart failure (HCC)   Coronary artery disease, non-occlusive   Hyperlipidemia   Type 2 diabetes mellitus (HCC)   PAF (paroxysmal atrial fibrillation), maintaining SR; CHA2DS2Vasc = 7   History of CVA (cerebrovascular accident)   Heme positive stool  Discharge Medications: Allergies as of 07/12/2016   No Known Allergies     Medication List    STOP taking these medications   aspirin EC 81 MG tablet   HYDROcodone-homatropine 5-1.5 MG/5ML syrup Commonly known as:  HYCODAN     TAKE these medications   acetaminophen 650 MG CR tablet Commonly known as:  TYLENOL Take 650 mg by mouth 2 (two) times daily as needed for pain.   diclofenac sodium 1 % Gel Commonly known as:  VOLTAREN Apply 2 g topically 4 (four) times daily.   diltiazem 120 MG 24 hr capsule Commonly known as:  CARDIZEM CD Take 1 capsule (120 mg total) by mouth daily.   ferrous sulfate 325 (65 FE) MG tablet Take 325 mg by mouth daily with breakfast.   flecainide 150 MG tablet Commonly known as:  TAMBOCOR Take 1 tablet (150 mg total) by mouth 2 (two) times daily. What changed:  See the new instructions.   HYDROcodone-acetaminophen 5-325 MG tablet Commonly known as:  NORCO/VICODIN Take 0.5 tablets by mouth every 4 (four) hours as needed for severe pain.   hydrocortisone cream 1 % Apply 1 application topically 2 (two) times daily as needed for itching.   Insulin Glargine 100 UNIT/ML Solostar Pen Commonly known as:  LANTUS Inject 8 Units into the skin at bedtime. What changed:  how much to take  when to take this  reasons to take  this   loratadine 10 MG tablet Commonly known as:  CLARITIN Take 10 mg by mouth every evening.   metFORMIN 1000 MG tablet Commonly known as:  GLUCOPHAGE Take 1,000 mg by mouth 2 (two) times daily with a meal.   metoprolol succinate 25 MG 24 hr tablet Commonly known as:  TOPROL-XL TAKE 1/2 TABLET DAILY. (NEED MD APPT FOR FURTHER REFILLS)   multivitamin with minerals Tabs tablet Take 1 tablet by mouth every evening.   nitroGLYCERIN 0.4 MG SL tablet Commonly known as:  NITROSTAT Place 1 tablet (0.4 mg total) under the tongue every 5 (five) minutes x 3 doses as needed for chest pain.   pantoprazole 40 MG tablet Commonly known as:  PROTONIX Take 1 tablet (40 mg total) by mouth 2 (two) times daily.   pravastatin 40 MG tablet Commonly known as:  PRAVACHOL TAKE 1 TABLET EVERY EVENING   warfarin 5 MG tablet Commonly known as:  COUMADIN Take 2 tablets (10 mg total) by mouth daily at 6 PM. Until you follow up in the INR clinic. What changed:  how much to take  how to take this  when to take this  additional instructions      Disposition and follow-up:   Mr.[name] was discharged from [facility] in Good condition.  At the hospital follow up visit please address:  Fe Def Anemia: Patient with persistent ulceration and chronic GI blood loss, will likely need regular  follow-up with hemoglobin and iron testing. By mouth supplementation may be less helpful given the poor absorption. PAF: Patient is supratherapeutic INR Coumadin was held and reversed with vitamin K. Coumadin was restarted at 10 mg patient has follow-up in INR clinic. Please confirm Coumadin dosing therapeutic INR. DM: Patient's insulin regimen was reduced due to poor by mouth intake. Last A1c in our system was 6.0 which is likely represents control which is unnecessarily tight. Consider loosening A1c goal to greater than 7 in elderly gentleman 76.  Labs / imaging needed at time of follow-up:  Hgb, Fe studies  Follow-up Appointments: Follow-up Information    [name], MD. Go on 07/17/2016.   Specialty:  Family Medicine Why:  Appointment at 11:00am for hospital follow up. Contact information: [place] [id number] [place] Hospital Course by problem list: Principal Problem:   Symptomatic anemia Active Problems:   Atrial flutter - status post CTI ablation   Essential hypertension   Chronic diastolic congestive heart failure (HCC)   Coronary artery disease, non-occlusive   Hyperlipidemia   Type 2 diabetes mellitus (HCC)   PAF (paroxysmal atrial fibrillation), maintaining SR; CHA2DS2Vasc = 7   History of CVA (cerebrovascular accident)   Heme positive stool   1. Symptomatic anemia 2/2 iron deficiency from acute on chronic blood loss and malabsorbtion d/t gastrectomy: Patient with a history of partial gastrectomy and Billroth II anastomosis as well as GI bleeding in the past presented with several month history of generalized weakness, pallor, found to be anemic with hemoglobin 7.8. He was evaluated by GI with EGD and found to have distal esophagitis without evidence of bleeding, as well as ulceration around gastrojejunal anastomosis. No interventions were done during the procedure. GI recommended twice daily PPI therapy indefinitely, and discontinuation of aspirin therapy if possible. Patient was given 2 units of packed red blood cells and had a response of hemoglobin to 9.3 at the time of discharge. He was also given an infusion of IV iron. Given the patient's gastrectomy likely has poor absorption of oral iron and may need frequent monitoring her hemoglobin and iron levels for his IV iron infusion. Patient was feeling improved at the time of discharge.  2. PAF on warfarin with supratherapeutic INR: Patient is a history of paroxysmal A. fib status post ablation currently on rhythm and rate control with flecainide, diltiazem,  metoprolol. He is on anticoagulation with warfarin and was found to be supratherapeutic with INR of 5.2 on arrival. In the emergency department he was reversed with vitamin K and INR trended down to 1.2. Patient's Coumadin was restarted at 10 mg and follow-up INR clinic 2 days following discharge. Normal sinus rhythm during his entirety of his hospitalization  3. DM: Patient's long history of diabetes mellitus controlled with metformin and Lantus. On arrival patient had poor by mouth intake and his Lantus dosing was reduced from 16 units daily 2 units daily. His metformin was held. Patient's blood glucose remained under good control on this dosing. Patient was recommended to continue with 8 units daily dosing of his Lantus on discharge with follow-up esophagus. Last A1c recorded our system was 6.0. Would not recommend this tight control in elderly gentleman with risk of hypoglycemia. Patient to continue to follow-up with PCP for further titration of insulin. Metformin was restarted at discharge.  4. CAD / h/o CVA: Patient's history of coronary artery disease as well as lacunar strokes. He is currently  on secondary prophylaxis with aspirin therapy. As noted above, cancer neurology recommended he discontinue aspirin therapy for gastric protection. This was discontinued at discharge to allow for gastric healing. Patient continue discussion on risks and benefits of continued aspirin therapy versus no aspirin therapy in light of his GI bleeding and history of CVA.  Discharge Vitals:   BP (!) 117/56 (BP Location: Right Arm)   Pulse 76   Temp 97.8 F (36.6 C) (Axillary)   Resp 20   Ht 5\' 3"  (1.6 m)   Wt 174 lb 11.2 oz (79.2 kg) Comment: scale b  SpO2 100%   BMI 30.95 kg/m   Pertinent Labs, Studies, and Procedures:  Recent Labs Lab 07/11/16 0339 07/11/16 1635 07/12/16 0513  HGB 7.9* 9.4* 9.3*  HCT 28.9* 32.3* 32.5*  WBC 6.8 6.9 7.4  PLT 295 329 345    Recent Labs Lab 07/10/16 0838  07/11/16 0339 07/12/16 0915  INR 5.20* 1.40 1.19   Procedures Performed:  Dg Chest 2 View Result Date: 07/10/2016 IMPRESSION: No active cardiopulmonary disease. The nodule noted at the right lung base on the prior exam is felt represent a granuloma.   Ct Abdomen Pelvis W Contrast Result Date: 07/10/2016 IMPRESSION: 1. Mild mesenteric inflammation near the gastroenteric anastomosis. There is no visible small bowel thickening, but given focal appearance this could reflect marginal enteritis. 2. The gastric remnant is moderately distended with semi-solid, also noted on preceding comparison CTs. Correlate for chronic outlet obstruction. 3. Right nephrolithiasis.  EGD: - Moderately severe reflux esophagitis. - A large amount of food (residue) in the stomach. - Patent Billroth II gastrojejunostomy was found, characterized by bleeding ulceration. - No specimens collected.  Consultations: Gastroenterology, Dr. Loletha Carrow  Discharge Instructions: Discharge Instructions    Call MD for:  difficulty breathing, headache or visual disturbances    Complete by:  As directed    Call MD for:  extreme fatigue    Complete by:  As directed    Call MD for:  persistant dizziness or light-headedness    Complete by:  As directed    Diet - low sodium heart healthy    Complete by:  As directed    Discharge instructions    Complete by:  As directed    Your blood counts and iron levels were very low causing you to feel weak. The EGD procedure by gastroenterology revealed that there was some small amount of bleeding in your stomach. There are several recommendations in order to reduce the irritation to your stomach, protected in the future, and to increase your blood counts and maintain your iron levels.  1) Stop taking your daily aspirin to give your stomach a chance to heal. You can discuss restarting this medication with your primary doctor after follow-up visit.  2) Start taking 40 mg Protonix 2 times every  day in order to help protect the lining of your stomach.  3) Continue to take your iron supplements every day. However your stomach may not absorb this iron well, and it will be important to follow up with your primary doctor to have your blood counts and iron levels checked periodically. He may require infusions of iron through your veins in order to maintain your levels.  You also had elevated INR on your arrival. We stopped your Coumadin initially and daily vitamins to bring her INR down. We have restarted her Coumadin at 10 mg a day and you should continue this until you follow up in your INR clinic on Friday.  We've also adjusted your insulin because you are not eating well while in the hospital. Please continue to take a reduced dose of your insulin at 8 units Lantus before bed, and follow-up with your primary doctor who can adjust her insulin as needed.   Increase activity slowly    Complete by:  As directed      Signed: Holley Raring, MD 07/12/2016, 12:17 PM   Pager: [phone]

## 2016-07-12 NOTE — ED Notes (Signed)
Patient left treatment room before having discharge papers reviewed and given to him. Pt was alert and appeared in no acute distress when ambulating to the lobby.

## 2016-07-12 NOTE — Progress Notes (Signed)
ANTICOAGULATION CONSULT NOTE -Follow up Pharmacy Consult:  Coumadin Indication: atrial fibrillation  No Known Allergies  Patient Measurements: Height: 5\' 3"  (160 cm) Weight: 174 lb 11.2 oz (79.2 kg) (scale b) IBW/kg (Calculated) : 56.9  Vital Signs: Temp: 97.8 F (36.6 C) (04/18 1214) Temp Source: Axillary (04/18 1214) BP: 117/56 (04/18 1214) Pulse Rate: 76 (04/18 1214)  Labs:  Recent Labs  07/10/16 0838  07/11/16 0339 07/11/16 1635 07/12/16 0513 07/12/16 0915  HGB 7.9*  < > 7.9* 9.4* 9.3*  --   HCT 28.6*  < > 28.9* 32.3* 32.5*  --   PLT 319  --  295 329 345  --   LABPROT 49.4*  --  17.3*  --   --  15.2  INR 5.20*  --  1.40  --   --  1.19  CREATININE 0.98  --  0.90  --  0.93  --   < > = values in this interval not displayed.  Estimated Creatinine Clearance: 61.9 mL/min (by C-G formula based on SCr of 0.93 mg/dL).   Medical History: Past Medical History:  Diagnosis Date  . Anemia    takes Ferrous Sulfate daily  . Arthritis    "all over"  . Atrial flutter ([place])    a. 07/2010 Status post caval tricuspid isthmus ablation by Dr. Midge Aver Metoprolol daily  . Balance problem 01/2014  . CAP (community acquired pneumonia) 09/18/2014  . Cervical radiculopathy due to degenerative joint disease of spine   . COPD (chronic obstructive pulmonary disease) ([place])   . Coronary artery disease, non-occlusive    a. 03/2010 Nonocclusive disease by cath, performed for ST elevations on ECG;  b. 06/2013 Lexi MV: EF 60%, no ischemia.  . Diabetes mellitus type II    takes Metformin and Lantus daily  . Diastolic CHF, chronic ([place])    a. 12/2012 EF 55-60%, diast dysfxn, triv MR, mildly dil LA/RA.  [name] Dysrhythmia    HX OF ATRIAL IFB /FLUTTER takes Flecanide and Coumadin daily  . History of blood transfusion 1982   "when I had stomach OR"  . History of bronchitis    1998  . History of gastric ulcer   . HTN (hypertension)    takes Diltiazem daily  . Hyperlipidemia    takes  Pravastatin daily  . Joint pain   . PAF (paroxysmal atrial fibrillation) ([place])    a. Recurrent after atrial flutter, currently controlled on flecainide plus diltiazem  . Pneumonia 1999  . Weakness    numbness and tingling both hands      Assessment: 28 YOM with history of Afib/Aflutter on Coumadin PTA.  He presented on 07/10/16  with symptomatic anemia so Coumadin was held and reversed with Vitamin K 5mg  IV x 1 on 07/10/16.  Now s/p EGD that is negative for acute bleed and Pharmacy consulted to resume Coumadin on 07/11/16.  INR currently sub-therapeutic at 1.19 today. Anticipating a slow rise in INR given Vitamin K administration. - hgb 7.9>9.3, plts WNL  s/p 2U pRBC and IV Iron.  ASA is on hold.  Currently on po Iron supplement.   MD notes: Anticipating discharge today. No need for bridge. Continue warfarin 10mg  daily, f/u in INR clinic on Friday.  Home Coumadin regimen:  7.5mg  daily except 5mg  MWF   Goal of Therapy:  INR 2-3    Plan:  - Coumadin 10mg  PO today x1 if not discharged today. - Daily PT / INR - Monitor closely for s/sx of bleeding  Thuy D. Mina Marble, PharmD, BCPS Pager:  [phone] 07/12/2016, 12:20 PM

## 2016-07-12 NOTE — ED Triage Notes (Signed)
Pt presents from home after c/o headache and back pain. Chronic back pain. Neuro intact. Alert and oriented.

## 2016-07-12 NOTE — Progress Notes (Signed)
Internal Medicine Attending:   I saw and examined the patient. I reviewed the resident's note and I agree with the resident's findings and plan as documented in the resident's note. [name] is feeling much better today,  Hgb stable at ~9.  His daughter was present today and we discussed findings from EGD and recommendations as outlined in Dr Doran Clay note.  He has received IV iron as well as 2 units of pRBC so I suspect this will improve his iron reserves.  He may continue PO iron although we excect this will be poorly absorbed and he will likely need intermittent IV iron transfusions.  Given some ulceration on EGD we have recommended he hold aspirin for now.  He does have non obstructive CAD and previous history of lacunar infarct, he will discuss with PCP of timing of when to resume ASA.  He has an appointment Friday for INR recheck,  I suspect he will have sluggish response given vit K administration this hospitalization.

## 2016-07-12 NOTE — ED Triage Notes (Signed)
States lower back which is chronic no bowel or bladder problems voiced, states headache in back of head and neck worse with movement alert and oriented x 3 moves all extremities.

## 2016-07-12 NOTE — ED Notes (Signed)
Bed: WA04 Expected date:  Expected time:  Means of arrival:  Comments: 76 yo M  General weakness, chemo pt, urinary retention

## 2016-07-12 NOTE — ED Provider Notes (Signed)
[facility] DEPT Provider Note   CSN: [id number] Arrival date & time: 07/12/16  4098     History   Chief Complaint Chief Complaint  Patient presents with  . Back Pain  . Headache    HPI [name] is a 76 y.o. male.  The history is provided by the patient.  Back Pain   This is a chronic problem. The problem occurs constantly. The problem has not changed since onset.Associated symptoms include headaches. Pertinent negatives include no fever, no bowel incontinence, no perianal numbness, no bladder incontinence and no paresthesias.  Headache   This is a recurrent problem. Episode onset: 3 months. Episode frequency: intermittent. Progression since onset: fluctuating. The headache is associated with nothing. The pain is located in the occipital region. The quality of the pain is described as dull. Pertinent negatives include no fever, no near-syncope, no shortness of breath, no nausea and no vomiting. He has tried acetaminophen for the symptoms. The treatment provided moderate relief.   Been seen by his PCP for the headache and it's being managed with Tylenol.   Past Medical History:  Diagnosis Date  . Anemia    takes Ferrous Sulfate daily  . Arthritis    "all over"  . Atrial flutter ([place])    a. 07/2010 Status post caval tricuspid isthmus ablation by Dr. Midge Aver Metoprolol daily  . Balance problem 01/2014  . CAP (community acquired pneumonia) 09/18/2014  . Cervical radiculopathy due to degenerative joint disease of spine   . COPD (chronic obstructive pulmonary disease) ([place])   . Coronary artery disease, non-occlusive    a. 03/2010 Nonocclusive disease by cath, performed for ST elevations on ECG;  b. 06/2013 Lexi MV: EF 60%, no ischemia.  . Diabetes mellitus type II    takes Metformin and Lantus daily  . Diastolic CHF, chronic ([place])    a. 12/2012 EF 55-60%, diast dysfxn, triv MR, mildly dil LA/RA.  [name] Dysrhythmia    HX OF ATRIAL IFB /FLUTTER takes Flecanide and  Coumadin daily  . History of blood transfusion 1982   "when I had stomach OR"  . History of bronchitis    1998  . History of gastric ulcer   . HTN (hypertension)    takes Diltiazem daily  . Hyperlipidemia    takes Pravastatin daily  . Joint pain   . PAF (paroxysmal atrial fibrillation) ([place])    a. Recurrent after atrial flutter, currently controlled on flecainide plus diltiazem  . Pneumonia 1999  . Weakness    numbness and tingling both hands    Patient Active Problem List   Diagnosis Date Noted  . Heme positive stool   . Symptomatic anemia 07/10/2016  . Syncope 05/15/2015  . Cervical disc disease 05/15/2015  . Abnormal urinalysis 05/15/2015  . Anemia 05/15/2015  . History of CVA (cerebrovascular accident) 04/03/2014  . Near syncope 02/15/2014  . Pre-syncope 02/15/2014  . Benign neoplasm of rectum and anal canal 12/02/2013  . Lower gastrointestinal bleeding 11/30/2013  . Tobacco use disorder 11/30/2013  . PAF (paroxysmal atrial fibrillation), maintaining SR; CHA2DS2Vasc = 7 08/15/2013  . Anticoagulation goal of INR 2 to 3, for PAF - CHA2DS2Vasc = 7 08/15/2013  . Type 2 diabetes mellitus ([place]) 01/22/2013  . Tobacco abuse counseling 12/15/2012  . Hyperlipidemia   . Obesity (BMI 30-39.9) 12/14/2012  . Atrial flutter - status post CTI ablation 07/29/2010  . Essential hypertension 07/29/2010  . Chronic diastolic congestive heart failure ([place]) 07/29/2010  . Coronary artery disease, non-occlusive 07/29/2010  Past Surgical History:  Procedure Laterality Date  . ATRIAL ABLATION SURGERY  08/05/10   CTI ablation for atrial flutter by JA  . CARDIAC CATHETERIZATION  2012   nl LV function, no occlusive CAD, PAF  . CARDIOVERSION  12/07/2010    Successful direct current cardioversion with atrial fibrillation to normal sinus rhythm  . CARPAL TUNNEL RELEASE Bilateral 01/30/2014   Procedure: BILATERAL CARPAL TUNNEL RELEASE;  Surgeon: Marianna Payment, MD;  Location: [facility];   Service: Orthopedics;  Laterality: Bilateral;  . CATARACT EXTRACTION W/ INTRAOCULAR LENS  IMPLANT, BILATERAL Bilateral   . COLONOSCOPY [name] 12/02/2013   Procedure: COLONOSCOPY;  Surgeon: Irene Shipper, MD;  Location: [facility];  Service: Endoscopy;  Laterality: [name];  . ESOPHAGOGASTRODUODENOSCOPY [name] 09/22/2014   Procedure: ESOPHAGOGASTRODUODENOSCOPY (EGD);  Surgeon: Ronald Lobo, MD;  Location: [facility] ENDOSCOPY;  Service: Endoscopy;  Laterality: [name];  . ESOPHAGOGASTRODUODENOSCOPY [name] 07/11/2016   Procedure: ESOPHAGOGASTRODUODENOSCOPY (EGD);  Surgeon: Doran Stabler, MD;  Location[facility] ENDOSCOPY;  Service: Endoscopy;  Laterality: [name];  . INCISION AND DRAINAGE ABSCESS / HEMATOMA OF BURSA / KNEE / THIGH Left 1998   knee  . KNEE BURSECTOMY Left 1998  . LAPAROSCOPIC CHOLECYSTECTOMY  03/2010  . NM MYOVIEW LTD  07/22/2013   Normal EF ~60%, no ischemia or infarction.  [name] PARTIAL GASTRECTOMY  1982   subtotal; "took out 30% for ulcers"  . TRANSTHORACIC ECHOCARDIOGRAM  02/16/2014   EF 60%, no RWMA. - otherwise normal  . YAG LASER APPLICATION Bilateral        Home Medications    Prior to Admission medications   Medication Sig Start Date End Date Taking? Authorizing Provider  acetaminophen (TYLENOL) 650 MG CR tablet Take 650 mg by mouth 2 (two) times daily as needed for pain.   Yes Historical Provider, MD  diltiazem (CARDIZEM CD) 120 MG 24 hr capsule Take 1 capsule (120 mg total) by mouth daily. 03/19/15  Yes Erlene Quan, PA-C  ferrous sulfate 325 (65 FE) MG tablet Take 325 mg by mouth daily with breakfast.   Yes Historical Provider, MD  flecainide (TAMBOCOR) 150 MG tablet Take 1 tablet (150 mg total) by mouth 2 (two) times daily. 07/12/16  Yes Holley Raring, MD  Insulin Glargine (LANTUS) 100 UNIT/ML Solostar Pen Inject 8 Units into the skin at bedtime. 07/12/16  Yes Holley Raring, MD  loratadine (CLARITIN) 10 MG tablet Take 10 mg by mouth every evening.    Yes Historical Provider, MD  metFORMIN  (GLUCOPHAGE) 1000 MG tablet Take 1,000 mg by mouth 2 (two) times daily with a meal.    Yes Historical Provider, MD  metoprolol succinate (TOPROL-XL) 25 MG 24 hr tablet TAKE 1/2 TABLET DAILY. (NEED MD APPT FOR FURTHER REFILLS) Patient taking differently: TAKE 1/2 TABLET (12.5 MG) DAILY. (NEED MD APPT FOR FURTHER REFILLS) 06/13/16  Yes Leonie Man, MD  Multiple Vitamin (MULTIVITAMIN WITH MINERALS) TABS tablet Take 1 tablet by mouth 2 (two) times daily.    Yes Historical Provider, MD  pravastatin (PRAVACHOL) 40 MG tablet TAKE 1 TABLET EVERY EVENING Patient taking differently: TAKE 1 TABLET PO EVERY EVENING 04/25/16  Yes Leonie Man, MD  warfarin (COUMADIN) 5 MG tablet Take 2 tablets (10 mg total) by mouth daily at 6 PM. Until you follow up in the INR clinic. Patient taking differently: Take 5-7.5 mg by mouth as directed. Take 1.5 tablets (7.5 mg) on Sun, Tues and Thurs and Take 1 tablet (5 mg) on MWF and Sat 07/12/16  Yes Holley Raring, MD  diclofenac sodium (VOLTAREN) 1 % GEL Apply 2 g topically 4 (four) times daily. 07/12/16   Holley Raring, MD  HYDROcodone-acetaminophen (NORCO/VICODIN) 5-325 MG tablet Take 0.5 tablets by mouth every 4 (four) hours as needed for severe pain. Patient not taking: Reported on 07/10/2016 2/48/25   Delora Fuel, MD  hydrocortisone cream 1 % Apply 1 application topically 2 (two) times daily as needed for itching.  04/27/15   Historical Provider, MD  nitroGLYCERIN (NITROSTAT) 0.4 MG SL tablet Place 1 tablet (0.4 mg total) under the tongue every 5 (five) minutes x 3 doses as needed for chest pain. 11/19/15   Erlene Quan, PA-C  pantoprazole (PROTONIX) 40 MG tablet Take 1 tablet (40 mg total) by mouth 2 (two) times daily. 07/12/16   Holley Raring, MD    Family History Family History  Problem Relation Age of Onset  . Cancer Mother   . Heart attack Father   . Heart attack Brother     Social History Social History  Substance Use Topics  . Smoking status: Current Every  Day Smoker    Packs/day: 0.50    Years: 55.00    Types: Cigarettes  . Smokeless tobacco: Never Used     Comment: He's been smoking between 0.5 and 2 ppd since age 10.  [name] Alcohol use No     Comment: "quit drinking in 1986"     Allergies   Patient has no known allergies.   Review of Systems Review of Systems  Constitutional: Negative for fever.  Respiratory: Negative for shortness of breath.   Cardiovascular: Negative for near-syncope.  Gastrointestinal: Negative for bowel incontinence, nausea and vomiting.  Genitourinary: Negative for bladder incontinence.  Musculoskeletal: Positive for back pain.  Neurological: Positive for headaches. Negative for paresthesias.     Physical Exam Updated Vital Signs BP 118/60 (BP Location: Right Arm)   Pulse 87   Temp 98.3 F (36.8 C) (Oral)   Resp 18   Ht 5\' 6"  (1.676 m)   Wt 174 lb (78.9 kg)   SpO2 95%   BMI 28.08 kg/m   Physical Exam  Constitutional: He is oriented to person, place, and time. He appears well-developed and well-nourished. No distress.  HENT:  Head: Normocephalic and atraumatic.  Nose: Nose normal.  Eyes: Conjunctivae and EOM are normal. Pupils are equal, round, and reactive to light. Right eye exhibits no discharge. Left eye exhibits no discharge. No scleral icterus.  Neck: Normal range of motion. Neck supple.  Cardiovascular: Normal rate and regular rhythm.  Exam reveals no gallop and no friction rub.   No murmur heard. Pulmonary/Chest: Effort normal and breath sounds normal. No stridor. No respiratory distress. He has no rales.  Abdominal: Soft. He exhibits no distension. There is no tenderness.  Musculoskeletal: He exhibits no edema or tenderness.  Neurological: He is alert and oriented to person, place, and time.  Mental Status: Alert and oriented to person, place, and time. Attention and concentration normal. Speech clear. Recent memory is intac  Cranial Nerves  II Visual Fields: Intact to confrontation.  Visual fields intact. III, IV, VI: Pupils equal and reactive to light and near. Full eye movement without nystagmus  V Facial Sensation: Normal. No weakness of masticatory muscles  VII: No facial weakness or asymmetry  VIII Auditory Acuity: Grossly normal  IX/X: The uvula is midline; the palate elevates symmetrically  XI: Normal sternocleidomastoid and trapezius strength  XII: The tongue is midline. No atrophy or fasciculations.  Motor System: Muscle Strength: 5/5 and symmetric in the upper and lower extremities. No pronation or drift.  Muscle Tone: Tone and muscle bulk are normal in the upper and lower extremities.   Reflexes: DTRs: 2+ and symmetrical in all four extremities. Plantar responses are flexor bilaterally.  Coordination: Intact finger-to-nose.. No tremor.  Sensation: Intact to light touch, and pinprick. Gait: Routine gait normal    Skin: Skin is warm and dry. No rash noted. He is not diaphoretic. No erythema.  Psychiatric: He has a normal mood and affect.  Vitals reviewed.    ED Treatments / Results  Labs (all labs ordered are listed, but only abnormal results are displayed) Labs Reviewed - No data to display  EKG  EKG Interpretation None       Radiology No results found.  Procedures Procedures (including critical care time)  Medications Ordered in ED Medications - No data to display   Initial Impression / Assessment and Plan / ED Course  I have reviewed the triage vital signs and the nursing notes.  Pertinent labs & imaging results that were available during my care of the patient were reviewed by me and considered in my medical decision making (see chart for details).     1. Chronic back pain. No acute changes. No red flags that would be concerning for cauda equina. No need for imaging at this time. Follow-up with PCP.  2. Headache Non focal neuro exam. No recent head trauma. No fever. Doubt meningitis. Doubt intracranial bleed. Doubt IIH. No  indication for imaging.   The patient is safe for discharge with strict return precautions.    Final Clinical Impressions(s) / ED Diagnoses   Final diagnoses:  Chronic bilateral low back pain without sciatica  Bad headache   Disposition: Discharge  Condition: Good  I have discussed the results, Dx and Tx plan with the patient who expressed understanding and agree(s) with the plan. Discharge instructions discussed at great length. The patient was given strict return precautions who verbalized understanding of the instructions. No further questions at time of discharge.    Discharge Medication List as of 07/12/2016 11:11 PM      Follow Up: Bernerd Limbo, MD [address] [place] [address] [phone]  Schedule an appointment as soon as possible for a visit  As needed      Fatima Blank, MD 07/13/16 [phone]

## 2016-07-12 NOTE — Progress Notes (Signed)
Subjective: Currently, the patient is feeling much improved. No acute complaints. Still some minor pain in hip and shoulder. Diarrhea improved.  Interval Events: Appropriate Hgb response after transfusion 1U pRBC yesterday. s/p IV Fe infusion.  Objective: Vital signs in last 24 hours: Vitals:   07/11/16 2033 07/12/16 0034 07/12/16 0433 07/12/16 0500  BP: (!) 122/52 (!) 124/56  136/73  Pulse: 74 75  77  Resp: 18 18  18   Temp: 97.5 F (36.4 C) 97.9 F (36.6 C)  97.7 F (36.5 C)  TempSrc: Oral Oral  Oral  SpO2: 99% 99%  100%  Weight:   174 lb 11.2 oz (79.2 kg)   Height:       Physical Exam: Physical Exam  Constitutional: He is oriented to person, place, and time. No distress.  Cardiovascular: Normal rate and regular rhythm.   Murmur heard. Pulmonary/Chest: Effort normal and breath sounds normal.  Abdominal: Soft. Bowel sounds are normal. There is no tenderness.  Musculoskeletal: He exhibits no edema.  Neurological: He is alert and oriented to person, place, and time.  Skin: There is pallor.   Labs: CBC:  Recent Labs Lab 07/10/16 0838 07/10/16 1802 07/11/16 0339 07/11/16 1635 07/12/16 0513  WBC 7.3  --  6.8 6.9 7.4  HGB 7.9* 8.1* 7.9* 9.4* 9.3*  HCT 28.6* 29.1* 28.9* 32.3* 32.5*  MCV 73.5*  --  73.9* 74.9* 75.4*  PLT 319  --  295 732 202   Metabolic Panel:  Recent Labs Lab 07/10/16 0838 07/11/16 0339 07/12/16 0513  NA 134* 135 137  K 3.8 3.0* 4.1  CL 102 106 107  CO2 22 20* 25  GLUCOSE 174* 164* 139*  BUN 11 7 6   CREATININE 0.98 0.90 0.93  CALCIUM 7.8* 7.5* 8.2*  ALT 33  --   --   ALKPHOS 115  --   --   BILITOT 0.4  --   --   PROT 5.6*  --   --   ALBUMIN 2.6*  --   --   LABPROT 49.4* 17.3*  --   INR 5.20* 1.40  --    BG:  Recent Labs Lab 07/10/16 2209 07/11/16 0747  GLUCAP 144* 123*     Lab Results  Component Value Date   HGBA1C 6.0 (H) 05/16/2015     Medications: Scheduled Medications: . diclofenac sodium  2 g Topical QID  .  diltiazem  120 mg Oral Daily  . ferrous sulfate  325 mg Oral Q breakfast  . flecainide  150 mg Oral Q12H  . insulin glargine  8 Units Subcutaneous Q2200  . ipratropium-albuterol  3 mL Nebulization Once  . loratadine  10 mg Oral QPM  . metoprolol succinate  12.5 mg Oral Daily  . nicotine  7 mg Transdermal Daily  . pantoprazole  40 mg Oral BID  . pravastatin  40 mg Oral QPM  . sodium chloride flush  3 mL Intravenous Q12H  . Warfarin - Pharmacist Dosing Inpatient   Does not apply q1800   PRN Medications: acetaminophen **OR** acetaminophen, HYDROcodone-acetaminophen  Assessment/Plan: Pt is a 76 y.o. male w/ a h/o CHF, t2DM, HTN, PAF s/p ablation on flecanaide and warfarin, h/o PUD s/p gastrectomy w/ Billroth II who presented to the ED with several month h/o progressive generalized weakness which worsened over the last 3 weeks, found to have anemia to 7.9 and positive FOBT. Now s/p EGD w/ signs of anastomosis ulcer and esophagitis.  Symptomatic anemia: GIB: s/p EGD with signs of ulceration  around gastrojejunal anastomosis and esophagitis. Hgb stable today at 9.3. s/p 2U pRBC and IV Fe. Hold ASA at discharge until f/u, should be on this for 2nd prophylaxis of lacunar stroke. - PPI PO 40mg  BID - daily PO Fe  Diarrhea / Acute viral illness: Continue to monitor, supportive care.  Shoulder pain / hip pain / back pain: s/p fall 3 weeks ago, no fx. Also w/ chronic back pain. Transition away from narcotics. - APAP 1000mg  q8h PRN - Voltaren gel PRN  DM: Pt has metformin + Lantus 16U daily home meds. Last A1c 6.0 04/2015. Would not favor tight control d/t concern of hypoglycemia. Hold metformin. - Lantus 8U qHS + SSI-s qAC  PAF: Supratherapeutic INR: NSR on flecainide, dilt, metop. INR 1.4-->1.19. No need for bridge. Continue warfarin 10mg  daily, f/u in INR clinic on Friday. - continue coumadin 10mg  - continue home meds for rhythm/rate control  Length of Stay: 2 day(s) Dispo:  Anticipated discharge today.  Holley Raring, MD Pager: [phone] (7AM-5PM) 07/12/2016, 7:18 AM

## 2016-07-14 ENCOUNTER — Ambulatory Visit (INDEPENDENT_AMBULATORY_CARE_PROVIDER_SITE_OTHER): Payer: Medicare Other | Admitting: Pharmacist Clinician (PhC)/ Clinical Pharmacy Specialist

## 2016-07-14 DIAGNOSIS — Z7901 Long term (current) use of anticoagulants: Secondary | ICD-10-CM | POA: Diagnosis not present

## 2016-07-14 DIAGNOSIS — I480 Paroxysmal atrial fibrillation: Secondary | ICD-10-CM

## 2016-07-14 DIAGNOSIS — I2510 Atherosclerotic heart disease of native coronary artery without angina pectoris: Secondary | ICD-10-CM

## 2016-07-14 LAB — POCT INR: INR: 1.2

## 2016-07-24 DIAGNOSIS — M542 Cervicalgia: Secondary | ICD-10-CM | POA: Diagnosis not present

## 2016-07-24 DIAGNOSIS — R600 Localized edema: Secondary | ICD-10-CM | POA: Diagnosis not present

## 2016-07-25 IMAGING — DX DG CHEST 2V
2 series · 2 of 2 positions shown · non-contrast
Comparison: 03/10/2015

CLINICAL DATA: Cough for 3 weeks

EXAM:
CHEST  2 VIEW

[x chest ap]
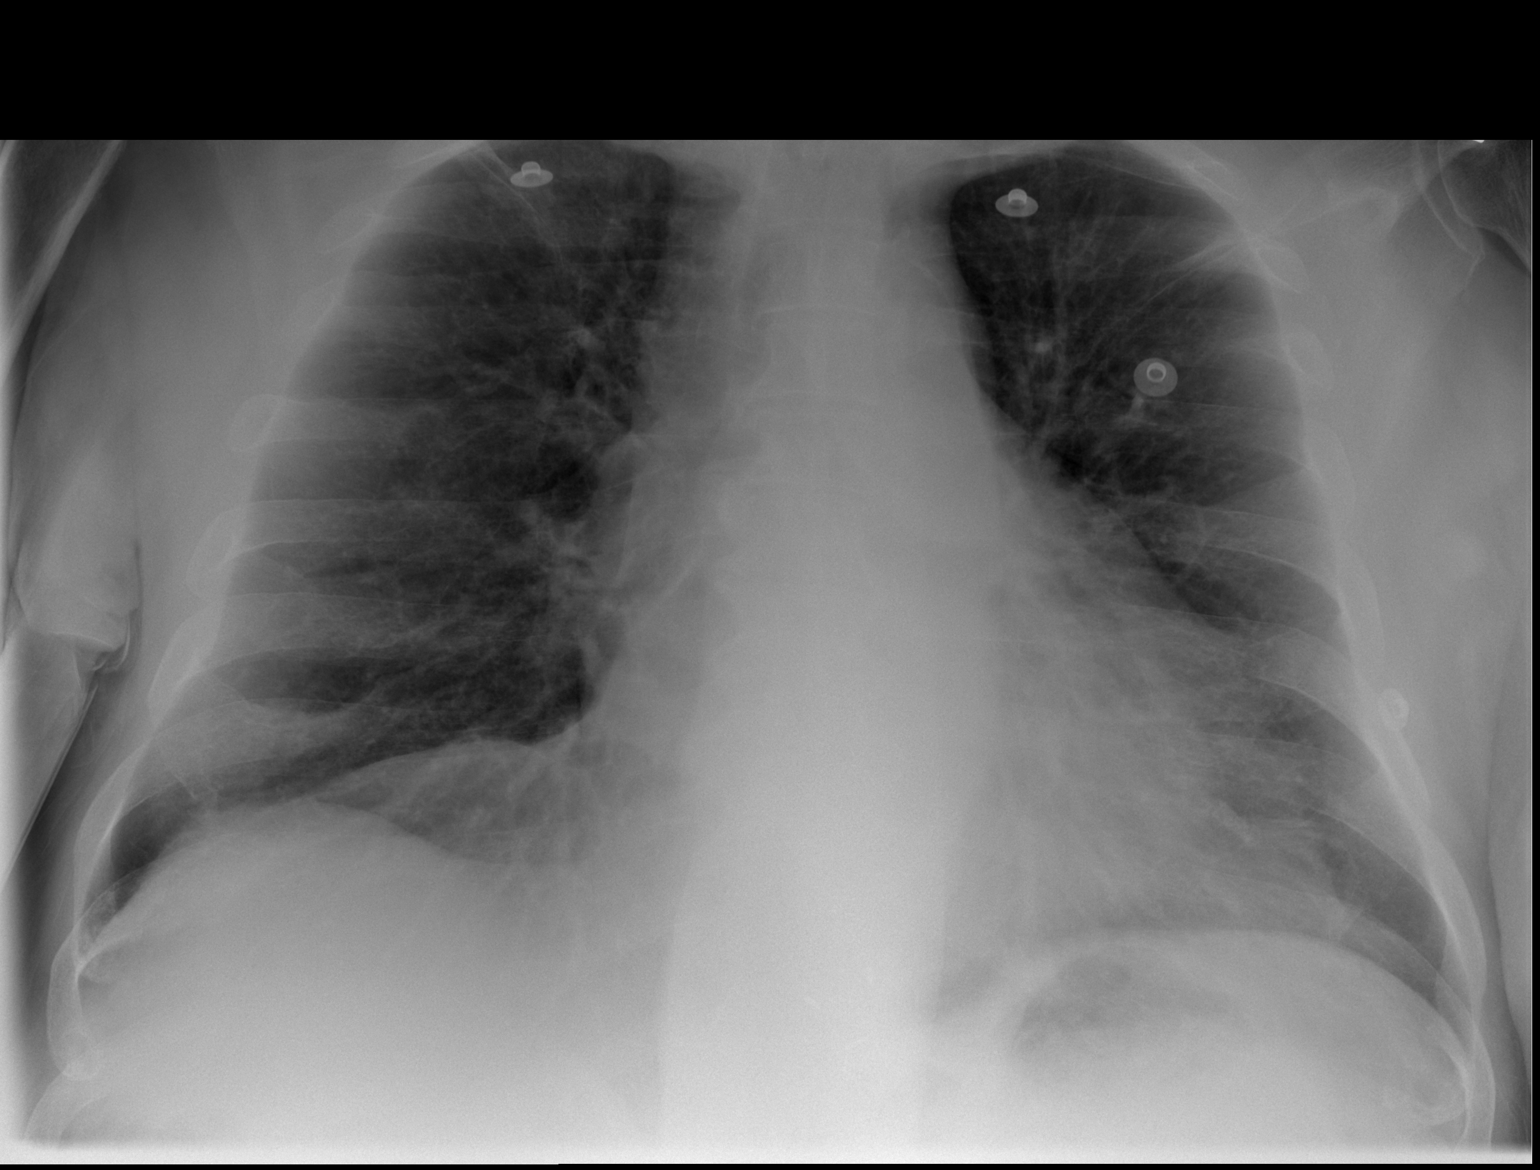

[w chest lat]
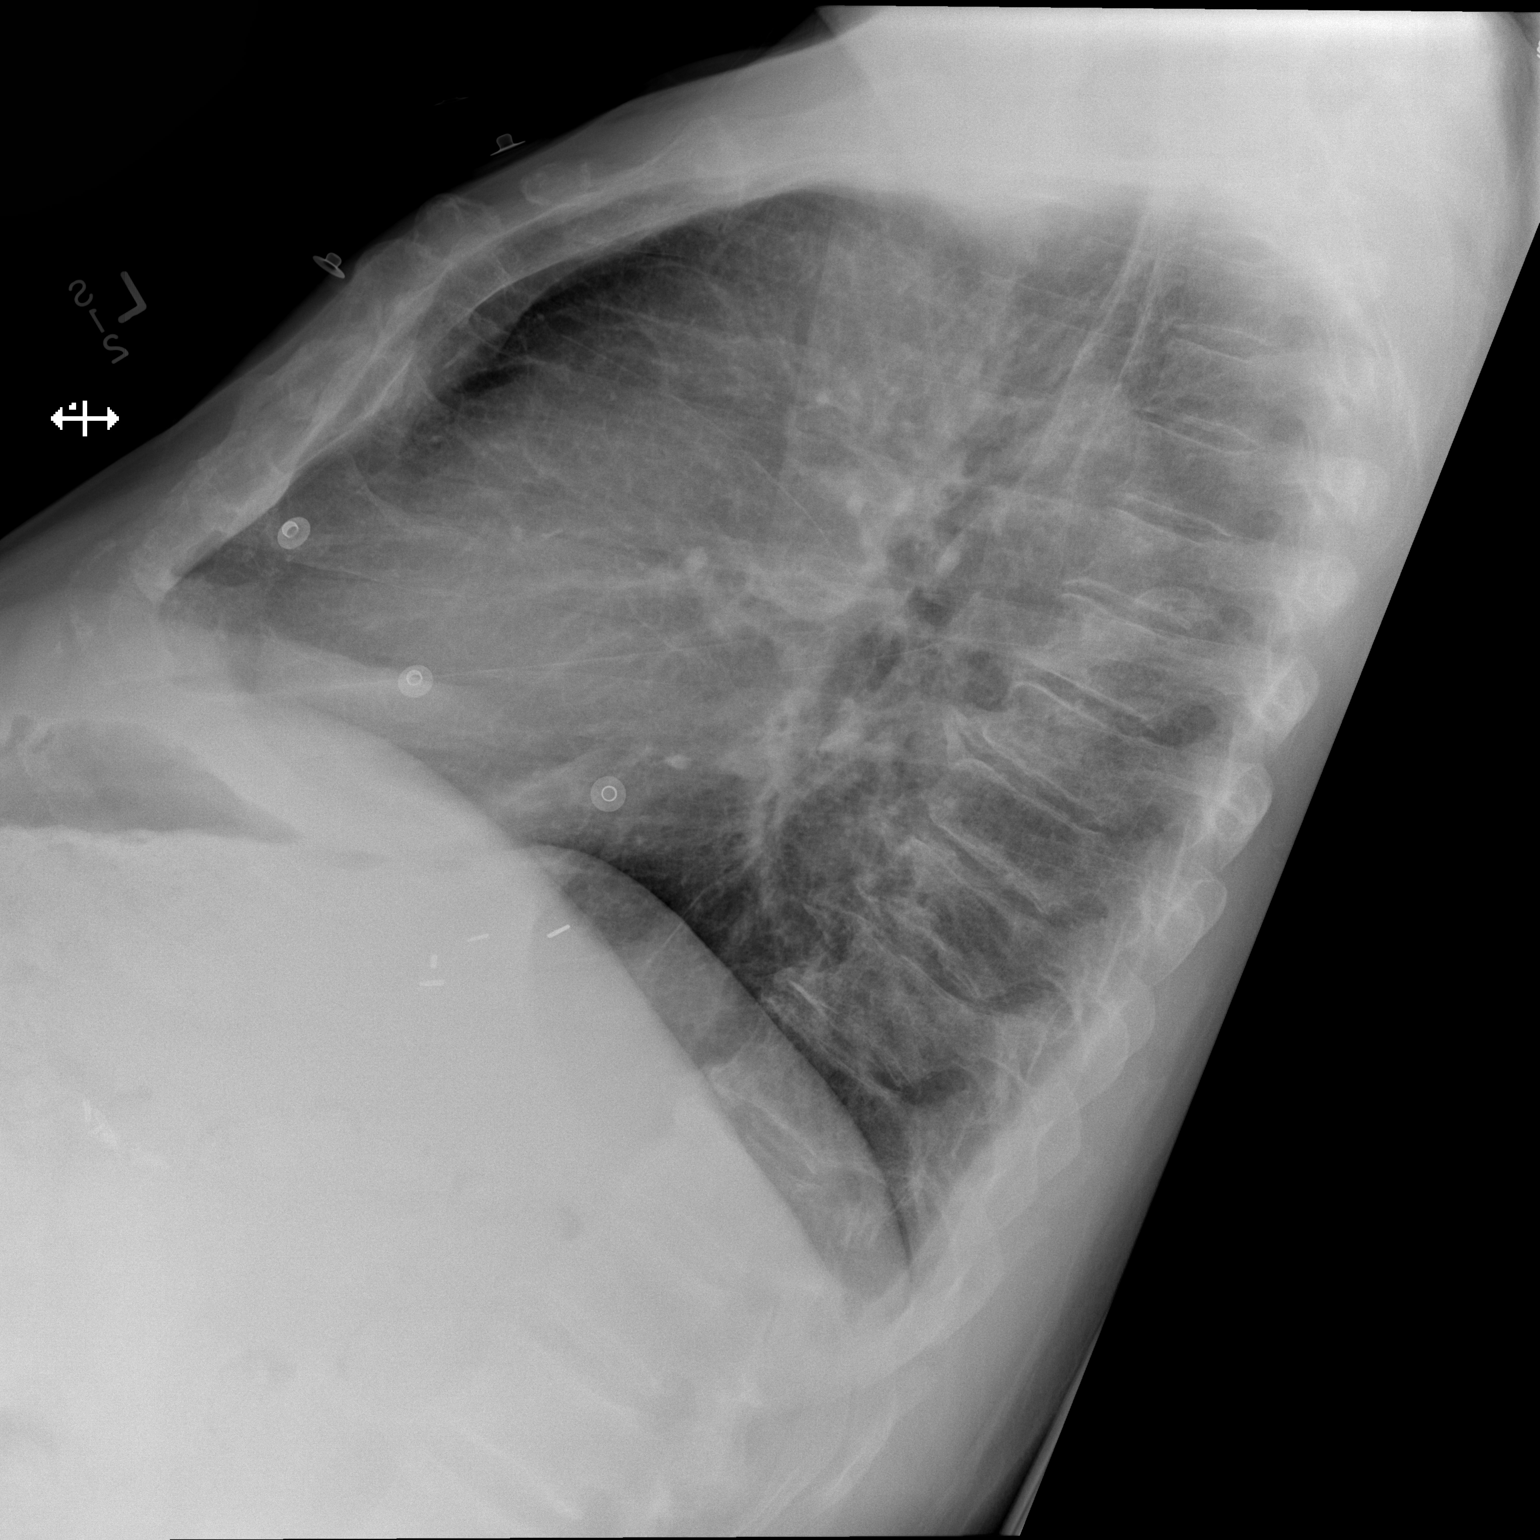

[2 of 2 positions shown; findings below may reference images not displayed]

FINDINGS: Lungs are under aerated and grossly clear. Normal heart size. No
pneumothorax. No pleural effusion.
IMPRESSION: No active cardiopulmonary disease.

## 2016-07-25 IMAGING — CT CT HEAD W/O CM
2 series · 16 of 30 positions shown, 20 images · non-contrast
Comparison: 05/31/2014

CLINICAL DATA: Syncopal episode, hypertension, possible seizure

EXAM:
CT HEAD WITHOUT CONTRAST
TECHNIQUE: Contiguous axial images were obtained from the base of the skull
through the vertex without contrast.

[Series 201: head w/o, idose (1) · axial · non-contrast · 0.43mm/px · z∈[+66,+201]mm · 13 of 33 slices shown, 17 images]
[im 3/33  brain]
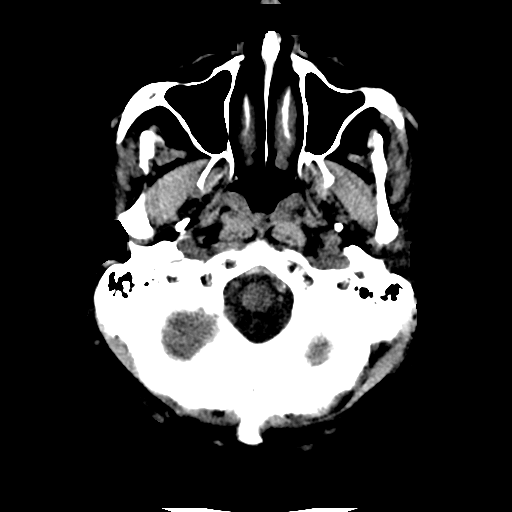
[im 3/33  bone]
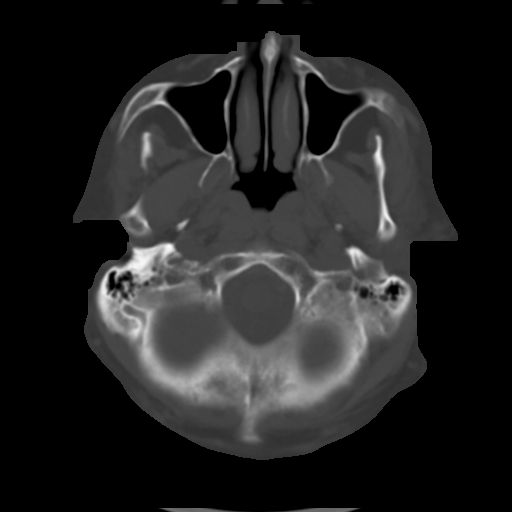
[im 5/33  brain]
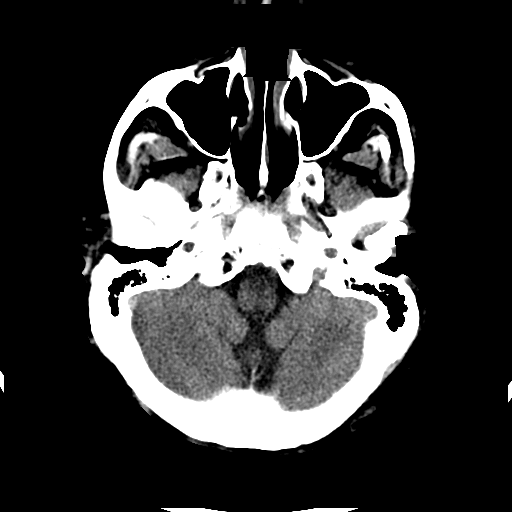
[im 7/33  brain]
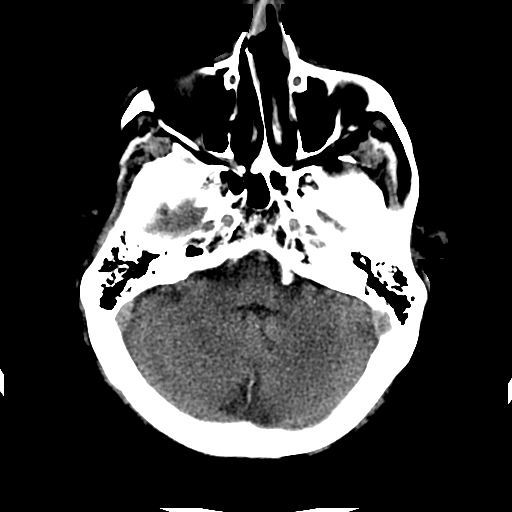
[im 10/33  brain]
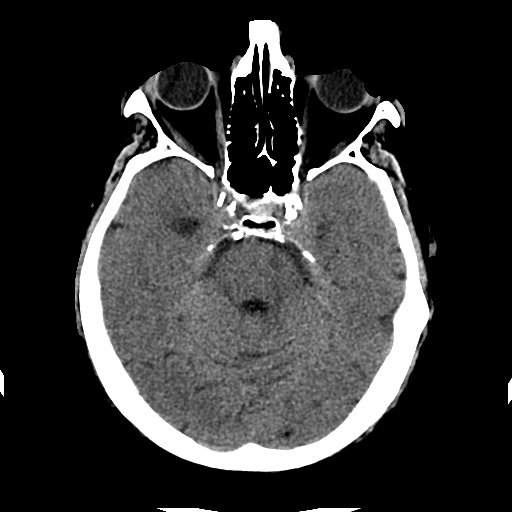
[im 12/33  brain]
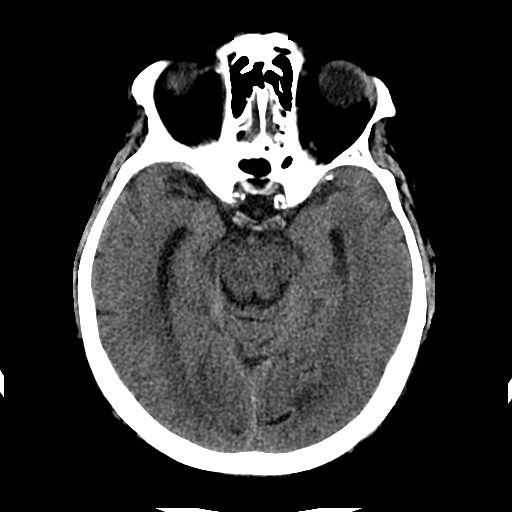
[im 12/33  bone]
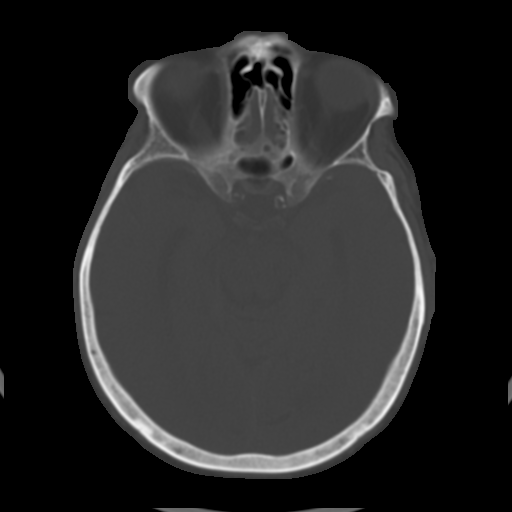
[im 14/33  brain]
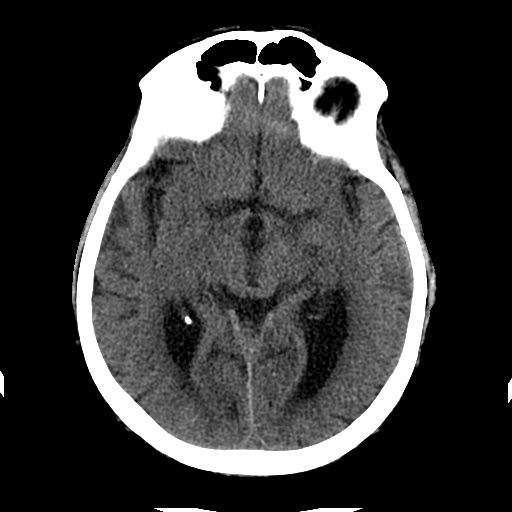
[im 17/33  brain]
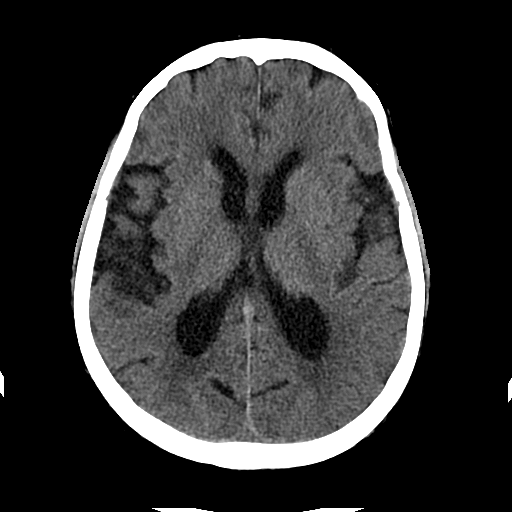
[im 19/33  brain]
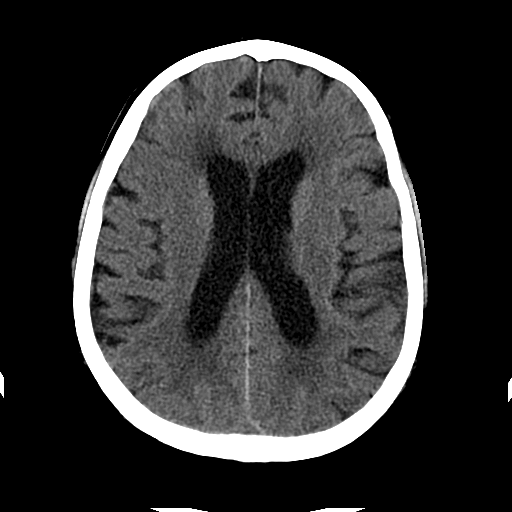
[im 21/33  brain]
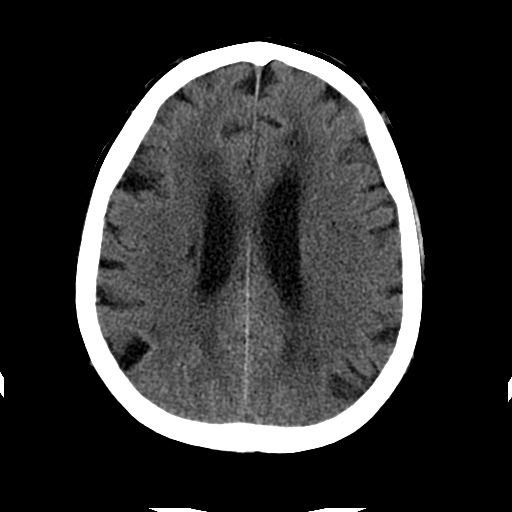
[im 21/33  bone]
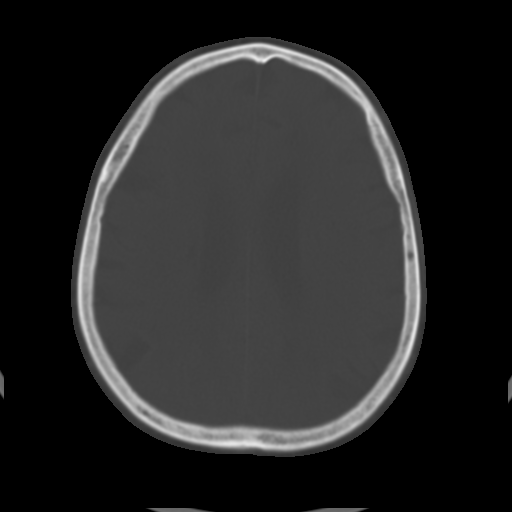
[im 23/33  brain]
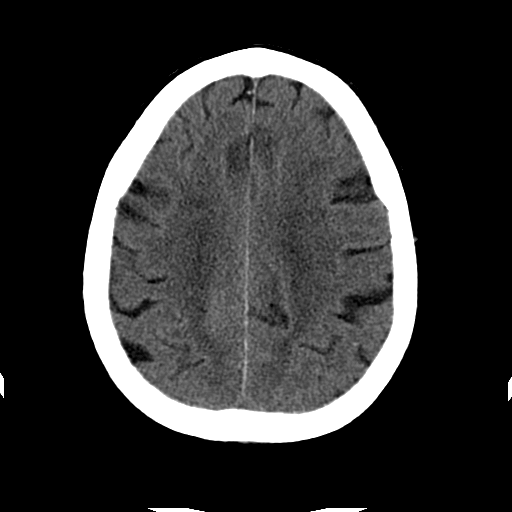
[im 26/33  brain]
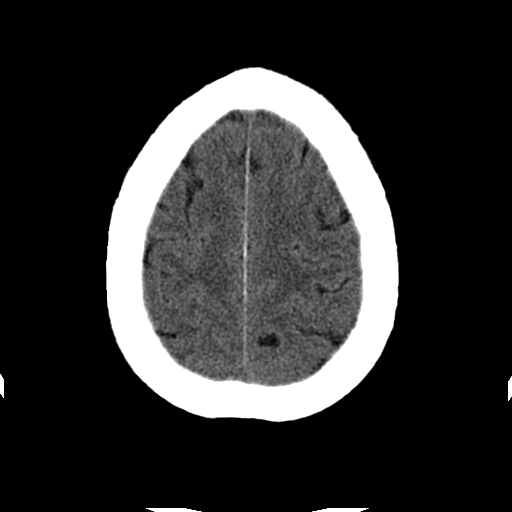
[im 28/33  brain]
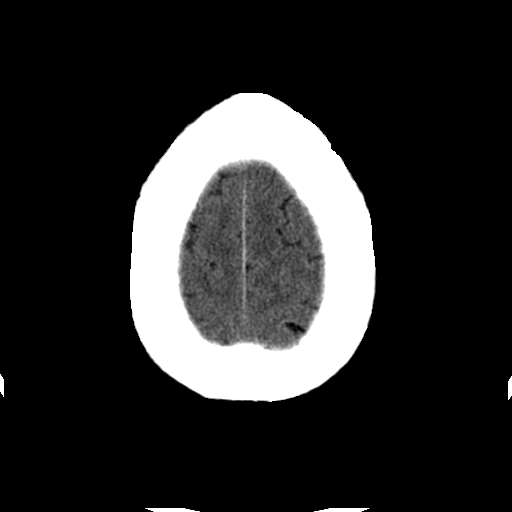
[im 30/33  brain]
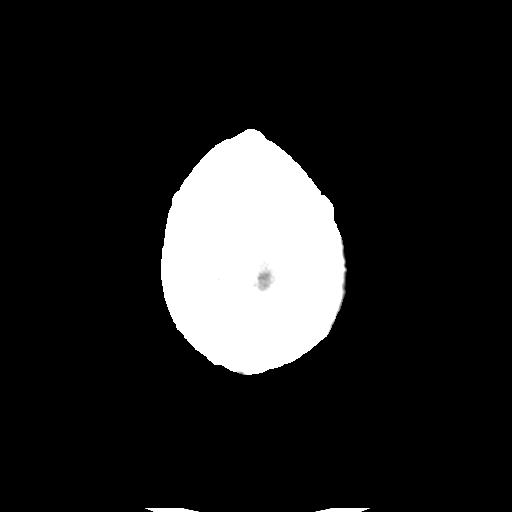
[im 30/33  bone]
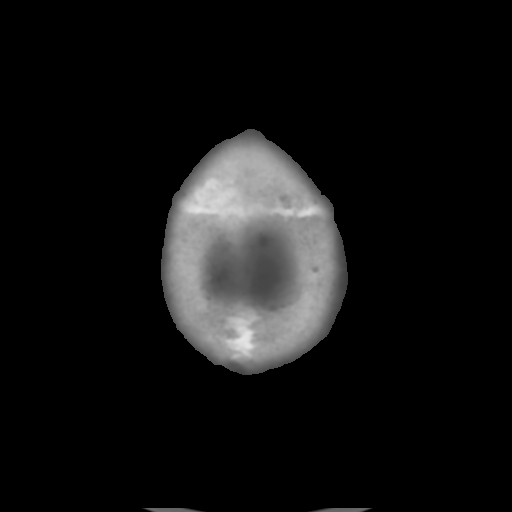

[Series 202: head w/o bone, idose (1) · axial · non-contrast · 0.43mm/px · z∈[+66,+111]mm · 3 of 33 slices shown]
[im 3/33  bone]
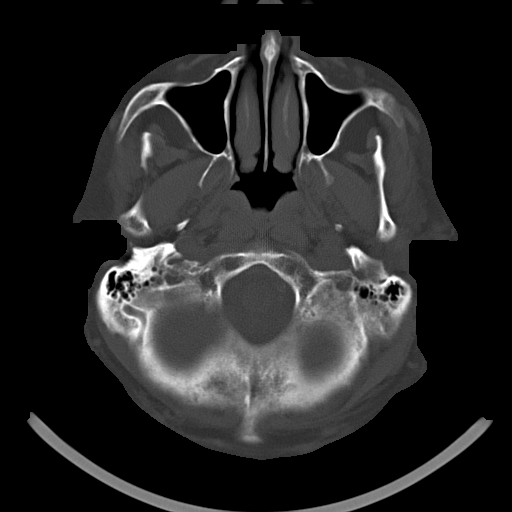
[im 7/33  bone]
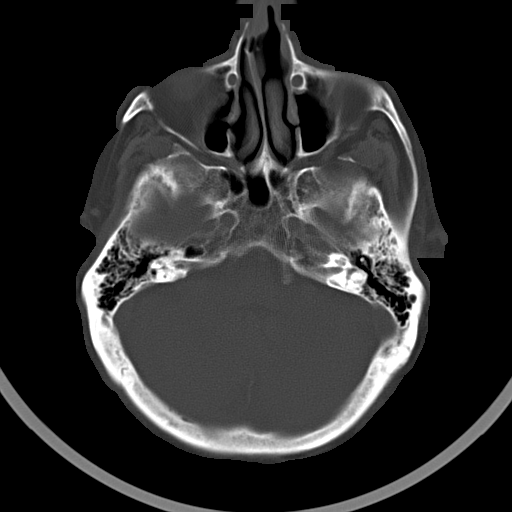
[im 12/33  bone]
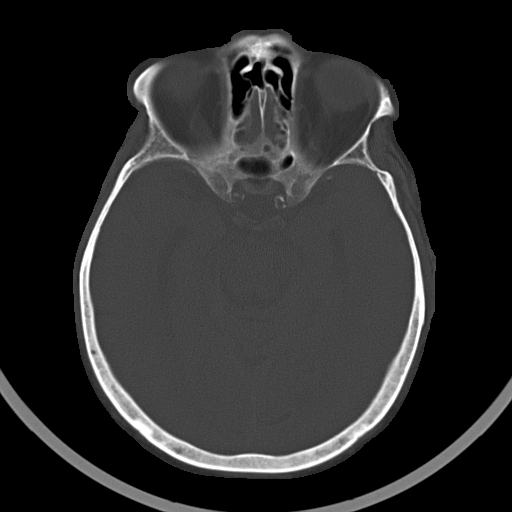

[16 of 30 positions shown; findings below may reference images not displayed]

FINDINGS: Stable brain atrophy and chronic white matter microvascular ischemic
changes about the ventricles throughout both cerebral hemispheres.
No acute intracranial hemorrhage, mass lesion, definite infarction,
focal mass effect or edema. No hydrocephalus, midline shift,
herniation, or extra-axial fluid collection. Cisterns are patent.
Mild cerebellar atrophy as well. Orbits are symmetric. Mastoids and
sinuses remain clear. No skull abnormality.
IMPRESSION: Stable atrophy and chronic white matter microvascular ischemic
changes.

No interval change or acute process by noncontrast CT.

## 2016-07-26 DIAGNOSIS — R600 Localized edema: Secondary | ICD-10-CM | POA: Diagnosis not present

## 2016-07-26 DIAGNOSIS — M542 Cervicalgia: Secondary | ICD-10-CM | POA: Diagnosis not present

## 2016-07-26 IMAGING — MR MR HEAD W/O CM
1 series · 19 of 27 positions shown · non-contrast
Comparison: Prior CT from 05/15/2015.

CLINICAL DATA: Initial evaluation for syncope.

EXAM:
MRI HEAD WITHOUT CONTRAST
TECHNIQUE: Multiplanar, multiecho pulse sequences of the brain and surrounding
structures were obtained without intravenous contrast.

[Series 3: T1 · sagittal · 5.0mm · 0.47mm/px · 19 of 27 slices shown]
[im 1/27]
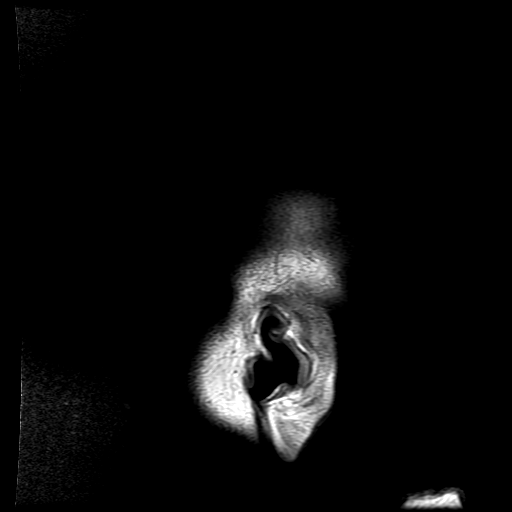
[im 2/27]
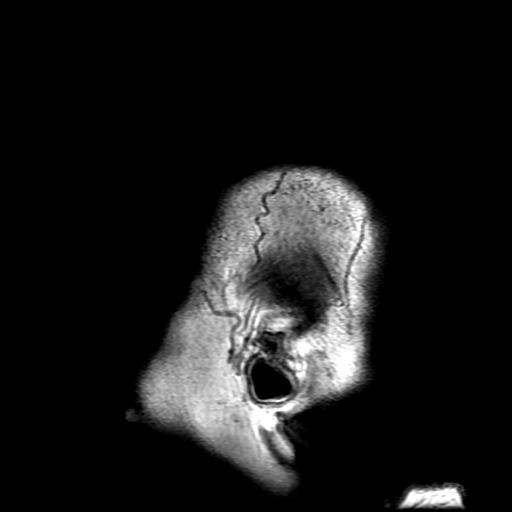
[im 3/27]
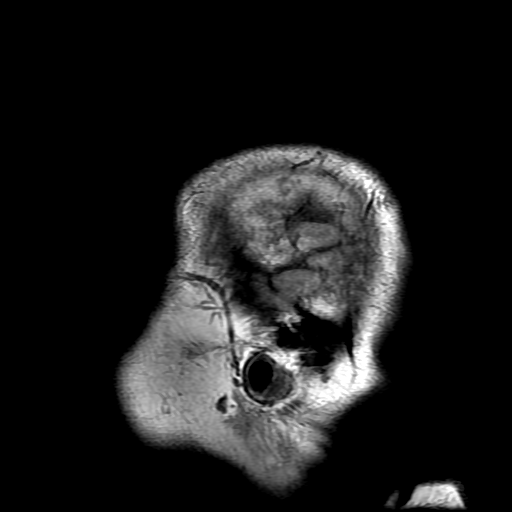
[im 4/27]
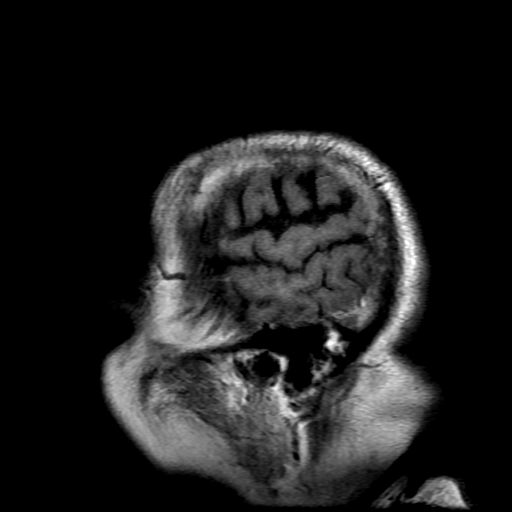
[im 5/27]
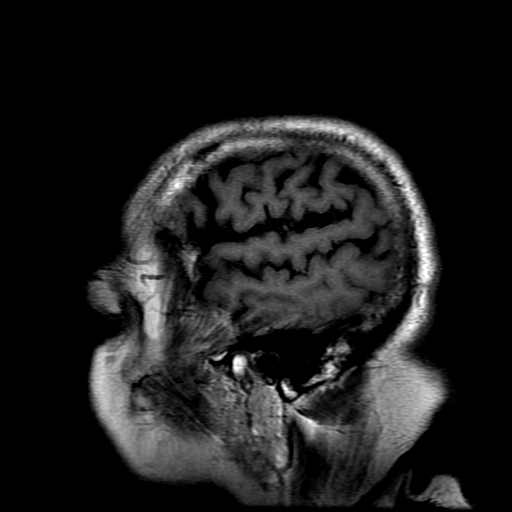
[im 6/27]
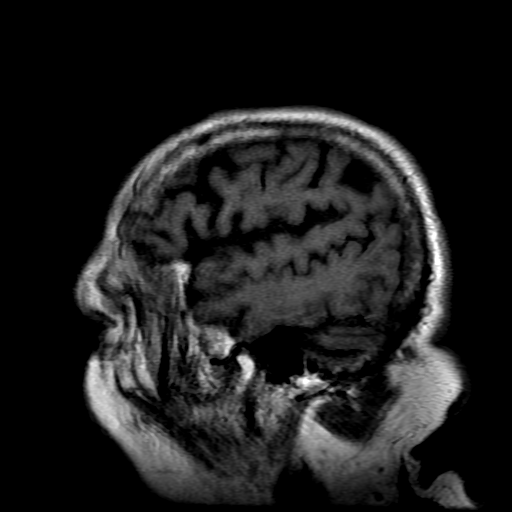
[im 7/27]
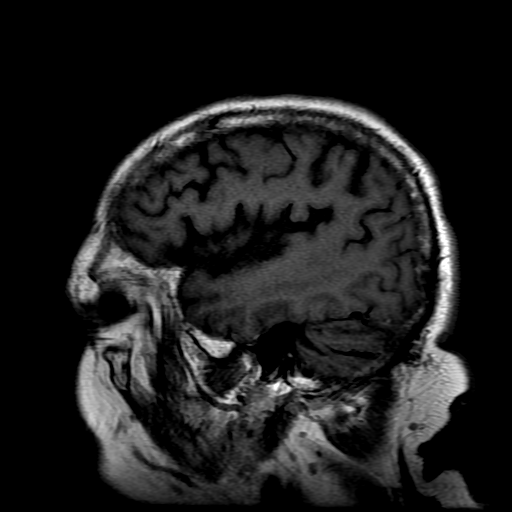
[im 8/27]
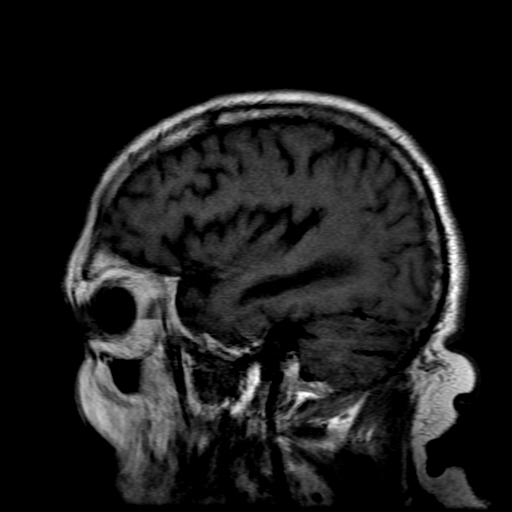
[im 9/27]
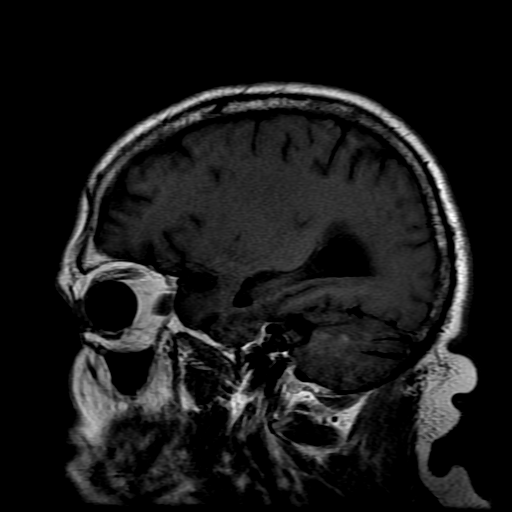
[im 10/27]
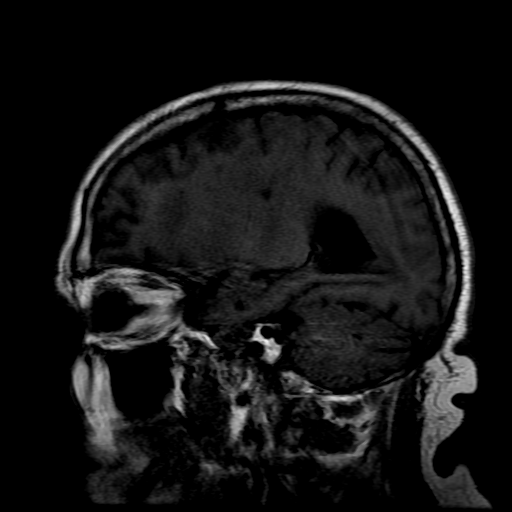
[im 11/27]
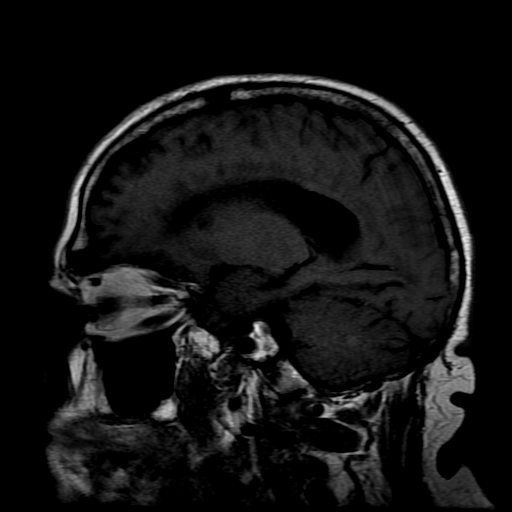
[im 12/27]
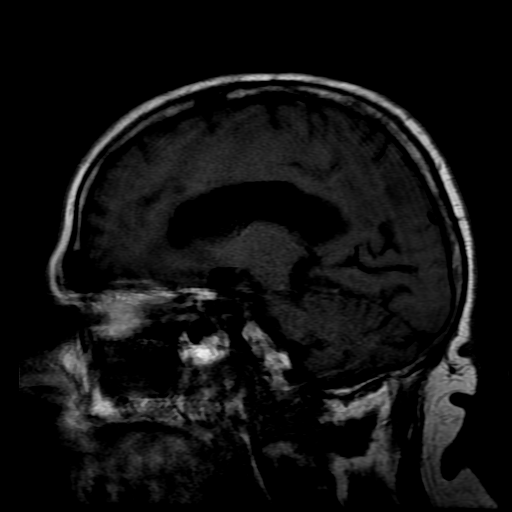
[im 13/27]
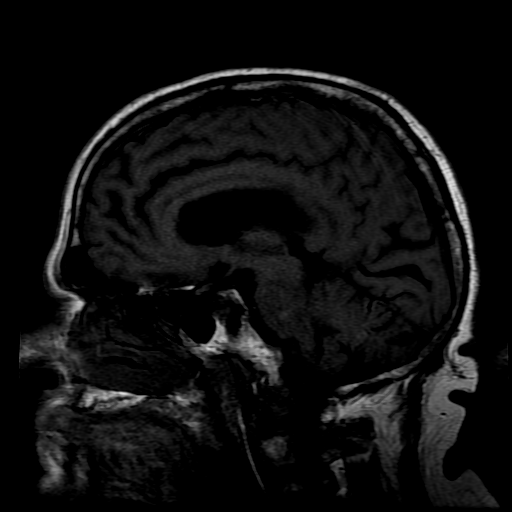
[im 14/27]
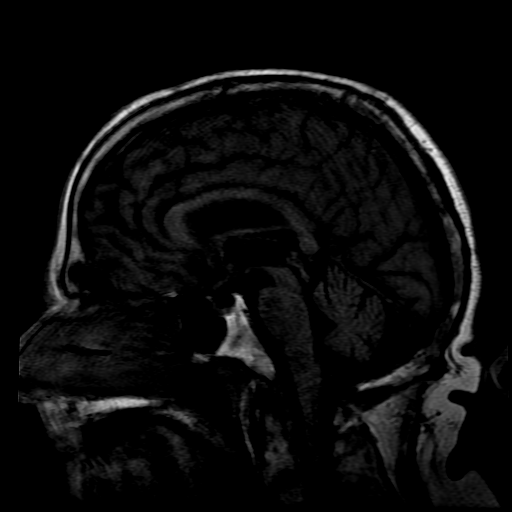
[im 16/27]
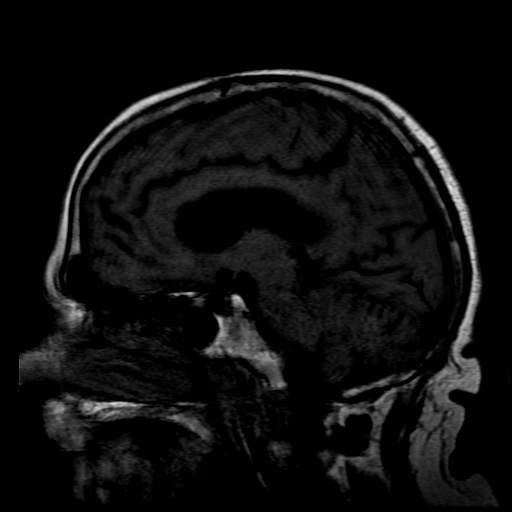
[im 19/27]
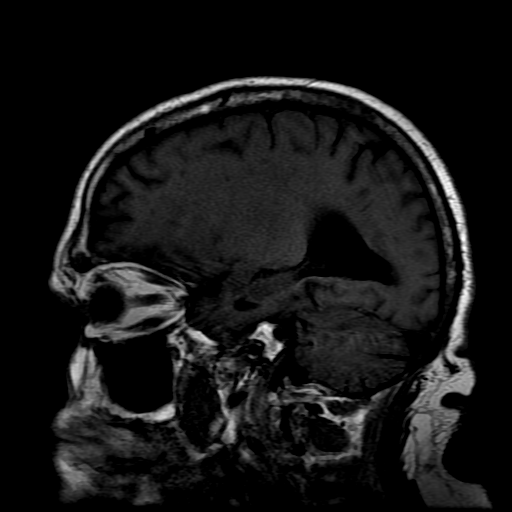
[im 22/27]
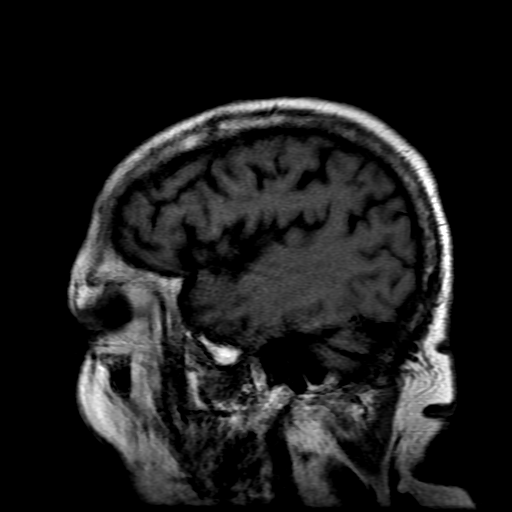
[im 23/27]
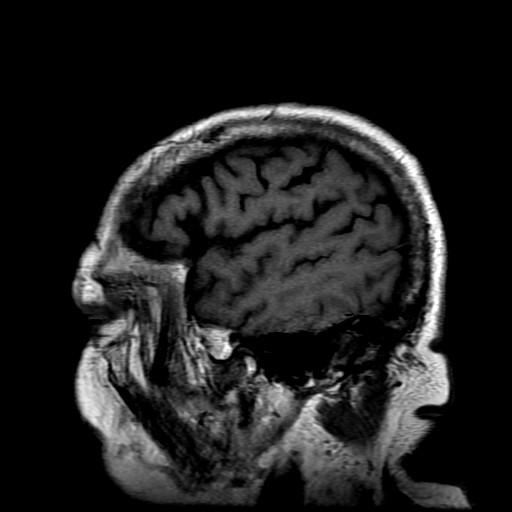
[im 26/27]
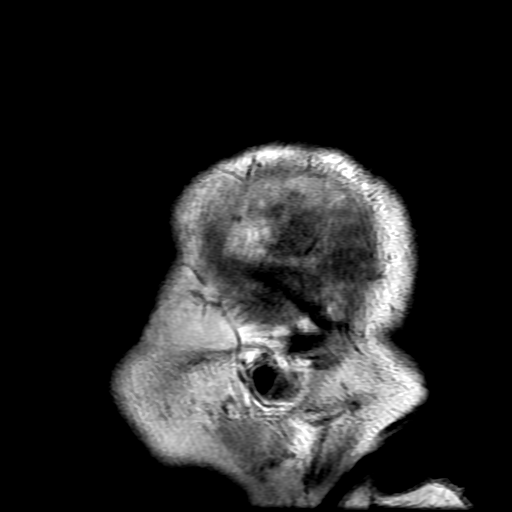

[19 of 27 positions shown; findings below may reference images not displayed]

FINDINGS: Study mildly degraded by motion artifact.

Diffuse prominence of the CSF containing spaces consistent with
generalized age-related cerebral atrophy. Patchy T2/FLAIR
hyperintensity within the periventricular and deep white matter both
cerebral hemispheres most consistent with chronic small vessel
ischemic disease, mild to moderate in nature. Small remote lacunar
infarct within the right corona radiata. Additional small remote
lacunar infarct within the left midbrain.

No abnormal foci of restricted diffusion to suggest acute
intracranial infarct. Major intracranial vascular flow voids are
maintained. No acute or chronic intracranial hemorrhage.

No mass lesion, midline shift, or mass effect. Ventricular
prominence related to global parenchymal volume loss present without
hydrocephalus. No extra-axial fluid collection. Major dural sinuses
are patent.

Craniocervical junction within normal limits.

Pituitary gland within normal limits. No acute abnormality about the
orbits. Sequela prior bilateral lens extraction noted.

Paranasal sinuses are clear. No mastoid effusion. Inner ear
structures grossly normal.

Bone marrow signal intensity within normal limits. No scalp soft
tissue abnormality.
IMPRESSION: 1. No acute intracranial process.
2. Generalized age-related cerebral atrophy with mild to moderate
chronic small vessel ischemic disease.

## 2016-07-27 ENCOUNTER — Ambulatory Visit (INDEPENDENT_AMBULATORY_CARE_PROVIDER_SITE_OTHER): Payer: Medicare Other | Admitting: Pharmacist

## 2016-07-27 DIAGNOSIS — M542 Cervicalgia: Secondary | ICD-10-CM | POA: Diagnosis not present

## 2016-07-27 DIAGNOSIS — R600 Localized edema: Secondary | ICD-10-CM | POA: Diagnosis not present

## 2016-07-27 LAB — PROTIME-INR: INR: 3.4 — AB (ref <=1.1)

## 2016-07-28 DIAGNOSIS — I10 Essential (primary) hypertension: Secondary | ICD-10-CM | POA: Diagnosis not present

## 2016-07-28 DIAGNOSIS — D508 Other iron deficiency anemias: Secondary | ICD-10-CM | POA: Diagnosis not present

## 2016-07-28 DIAGNOSIS — R600 Localized edema: Secondary | ICD-10-CM | POA: Diagnosis not present

## 2016-07-28 DIAGNOSIS — E119 Type 2 diabetes mellitus without complications: Secondary | ICD-10-CM | POA: Diagnosis not present

## 2016-07-28 DIAGNOSIS — I480 Paroxysmal atrial fibrillation: Secondary | ICD-10-CM | POA: Diagnosis not present

## 2016-07-28 DIAGNOSIS — M542 Cervicalgia: Secondary | ICD-10-CM | POA: Diagnosis not present

## 2016-07-28 DIAGNOSIS — Z794 Long term (current) use of insulin: Secondary | ICD-10-CM | POA: Diagnosis not present

## 2016-07-31 DIAGNOSIS — M542 Cervicalgia: Secondary | ICD-10-CM | POA: Diagnosis not present

## 2016-07-31 DIAGNOSIS — R600 Localized edema: Secondary | ICD-10-CM | POA: Diagnosis not present

## 2016-08-01 DIAGNOSIS — M542 Cervicalgia: Secondary | ICD-10-CM | POA: Diagnosis not present

## 2016-08-01 DIAGNOSIS — R600 Localized edema: Secondary | ICD-10-CM | POA: Diagnosis not present

## 2016-08-03 ENCOUNTER — Ambulatory Visit (INDEPENDENT_AMBULATORY_CARE_PROVIDER_SITE_OTHER): Payer: Medicare Other | Admitting: Pharmacist

## 2016-08-03 DIAGNOSIS — M542 Cervicalgia: Secondary | ICD-10-CM | POA: Diagnosis not present

## 2016-08-03 DIAGNOSIS — R600 Localized edema: Secondary | ICD-10-CM | POA: Diagnosis not present

## 2016-08-03 LAB — PROTIME-INR: INR: 1.7 — AB (ref <=1.1)

## 2016-08-04 DIAGNOSIS — M542 Cervicalgia: Secondary | ICD-10-CM | POA: Diagnosis not present

## 2016-08-04 DIAGNOSIS — R600 Localized edema: Secondary | ICD-10-CM | POA: Diagnosis not present

## 2016-08-08 DIAGNOSIS — M542 Cervicalgia: Secondary | ICD-10-CM | POA: Diagnosis not present

## 2016-08-08 DIAGNOSIS — R600 Localized edema: Secondary | ICD-10-CM | POA: Diagnosis not present

## 2016-08-10 ENCOUNTER — Other Ambulatory Visit: Payer: Self-pay | Admitting: Internal Medicine

## 2016-08-10 ENCOUNTER — Ambulatory Visit (INDEPENDENT_AMBULATORY_CARE_PROVIDER_SITE_OTHER): Payer: Medicare Other | Admitting: Pharmacist

## 2016-08-10 DIAGNOSIS — M542 Cervicalgia: Secondary | ICD-10-CM | POA: Diagnosis not present

## 2016-08-10 DIAGNOSIS — I480 Paroxysmal atrial fibrillation: Secondary | ICD-10-CM

## 2016-08-10 DIAGNOSIS — R600 Localized edema: Secondary | ICD-10-CM | POA: Diagnosis not present

## 2016-08-10 LAB — PROTIME-INR: INR: 2.3 — AB (ref <=1.1)

## 2016-08-11 DIAGNOSIS — R600 Localized edema: Secondary | ICD-10-CM | POA: Diagnosis not present

## 2016-08-11 DIAGNOSIS — M542 Cervicalgia: Secondary | ICD-10-CM | POA: Diagnosis not present

## 2016-08-14 ENCOUNTER — Other Ambulatory Visit: Payer: Self-pay | Admitting: Cardiology

## 2016-08-14 NOTE — Telephone Encounter (Signed)
Rx request sent to pharmacy.  

## 2016-08-17 ENCOUNTER — Ambulatory Visit (INDEPENDENT_AMBULATORY_CARE_PROVIDER_SITE_OTHER): Payer: Medicare Other | Admitting: Pharmacist Clinician (PhC)/ Clinical Pharmacy Specialist

## 2016-08-17 DIAGNOSIS — M542 Cervicalgia: Secondary | ICD-10-CM | POA: Diagnosis not present

## 2016-08-17 DIAGNOSIS — R600 Localized edema: Secondary | ICD-10-CM | POA: Diagnosis not present

## 2016-08-17 DIAGNOSIS — I480 Paroxysmal atrial fibrillation: Secondary | ICD-10-CM

## 2016-08-17 LAB — POCT INR: INR: 2.2

## 2016-08-29 ENCOUNTER — Other Ambulatory Visit: Payer: Self-pay | Admitting: Cardiology

## 2016-09-02 ENCOUNTER — Other Ambulatory Visit: Payer: Self-pay | Admitting: Internal Medicine

## 2016-09-08 ENCOUNTER — Ambulatory Visit (INDEPENDENT_AMBULATORY_CARE_PROVIDER_SITE_OTHER): Payer: Medicare Other | Admitting: Pharmacist Clinician (PhC)/ Clinical Pharmacy Specialist

## 2016-09-08 DIAGNOSIS — Z7901 Long term (current) use of anticoagulants: Secondary | ICD-10-CM

## 2016-09-08 DIAGNOSIS — I2510 Atherosclerotic heart disease of native coronary artery without angina pectoris: Secondary | ICD-10-CM | POA: Diagnosis not present

## 2016-09-08 DIAGNOSIS — I480 Paroxysmal atrial fibrillation: Secondary | ICD-10-CM

## 2016-09-08 LAB — POCT INR: INR: 1.4

## 2016-09-14 DIAGNOSIS — E119 Type 2 diabetes mellitus without complications: Secondary | ICD-10-CM | POA: Diagnosis not present

## 2016-09-14 DIAGNOSIS — I10 Essential (primary) hypertension: Secondary | ICD-10-CM | POA: Diagnosis not present

## 2016-09-14 DIAGNOSIS — D500 Iron deficiency anemia secondary to blood loss (chronic): Secondary | ICD-10-CM | POA: Diagnosis not present

## 2016-09-14 DIAGNOSIS — I480 Paroxysmal atrial fibrillation: Secondary | ICD-10-CM | POA: Diagnosis not present

## 2016-09-14 DIAGNOSIS — Z794 Long term (current) use of insulin: Secondary | ICD-10-CM | POA: Diagnosis not present

## 2016-09-22 ENCOUNTER — Other Ambulatory Visit: Payer: Self-pay | Admitting: Cardiology

## 2016-09-22 DIAGNOSIS — J449 Chronic obstructive pulmonary disease, unspecified: Secondary | ICD-10-CM | POA: Insufficient documentation

## 2016-09-22 DIAGNOSIS — E785 Hyperlipidemia, unspecified: Secondary | ICD-10-CM | POA: Insufficient documentation

## 2016-09-22 DIAGNOSIS — F17210 Nicotine dependence, cigarettes, uncomplicated: Secondary | ICD-10-CM | POA: Insufficient documentation

## 2016-09-22 DIAGNOSIS — L0231 Cutaneous abscess of buttock: Secondary | ICD-10-CM | POA: Insufficient documentation

## 2016-09-22 DIAGNOSIS — I5032 Chronic diastolic (congestive) heart failure: Secondary | ICD-10-CM | POA: Diagnosis not present

## 2016-09-22 DIAGNOSIS — I110 Hypertensive heart disease with heart failure: Secondary | ICD-10-CM | POA: Insufficient documentation

## 2016-09-22 DIAGNOSIS — Z79899 Other long term (current) drug therapy: Secondary | ICD-10-CM | POA: Diagnosis not present

## 2016-09-22 DIAGNOSIS — I480 Paroxysmal atrial fibrillation: Secondary | ICD-10-CM | POA: Diagnosis not present

## 2016-09-23 ENCOUNTER — Emergency Department (HOSPITAL_COMMUNITY)
Admission: EM | Admit: 2016-09-23 | Discharge: 2016-09-23 | Disposition: A | Discharge status: Home / Self Care | Payer: Medicare Other | Attending: Emergency Medicine | Admitting: Emergency Medicine

## 2016-09-23 ENCOUNTER — Encounter (HOSPITAL_COMMUNITY): Payer: Self-pay | Admitting: Emergency Medicine

## 2016-09-23 DIAGNOSIS — L0231 Cutaneous abscess of buttock: Secondary | ICD-10-CM

## 2016-09-23 LAB — CBC WITH DIFFERENTIAL/PLATELET
BASOS ABS: 0 10*3/uL (ref 0.0–0.1)
Basophils Relative: 1 %
EOS PCT: 3 %
Eosinophils Absolute: 0.2 10*3/uL (ref 0.0–0.7)
HEMATOCRIT: 28.2 % — AB (ref 39.0–52.0)
Hemoglobin: 8.5 g/dL — ABNORMAL LOW (ref 13.0–17.0)
LYMPHS ABS: 1.2 10*3/uL (ref 0.7–4.0)
LYMPHS PCT: 16 %
MCH: 25.2 pg — AB (ref 26.0–34.0)
MCHC: 30.1 g/dL (ref 30.0–36.0)
MCV: 83.7 fL (ref 78.0–100.0)
MONO ABS: 0.8 10*3/uL (ref 0.1–1.0)
MONOS PCT: 11 %
NEUTROS ABS: 4.9 10*3/uL (ref 1.7–7.7)
Neutrophils Relative %: 69 %
PLATELETS: 293 10*3/uL (ref 150–400)
RBC: 3.37 MIL/uL — ABNORMAL LOW (ref 4.22–5.81)
RDW: 17.4 % — AB (ref 11.5–15.5)
WBC: 7.1 10*3/uL (ref 4.0–10.5)

## 2016-09-23 LAB — PROTIME-INR
INR: 1.99
Prothrombin Time: 22.9 seconds — ABNORMAL HIGH (ref 11.4–15.2)

## 2016-09-23 LAB — BASIC METABOLIC PANEL
ANION GAP: 9 (ref 5–15)
BUN: 15 mg/dL (ref 6–20)
CALCIUM: 8.7 mg/dL — AB (ref 8.9–10.3)
CO2: 24 mmol/L (ref 22–32)
Chloride: 105 mmol/L (ref 101–111)
Creatinine, Ser: 0.89 mg/dL (ref 0.61–1.24)
GFR calc Af Amer: >60 mL/min (ref >60)
GFR calc non Af Amer: >60 mL/min (ref >60)
GLUCOSE: 118 mg/dL — AB (ref 65–99)
Potassium: 3.7 mmol/L (ref 3.5–5.1)
Sodium: 138 mmol/L (ref 135–145)

## 2016-09-23 MED ORDER — HYDROCODONE-ACETAMINOPHEN 5-325 MG PO TABS
1.0000 | ORAL_TABLET | Freq: Once | ORAL | Status: AC
  Administered 2016-09-23: 1 via ORAL
  Filled 2016-09-23: qty 1

## 2016-09-23 MED ORDER — CLINDAMYCIN HCL 300 MG PO CAPS
300.0000 mg | ORAL_CAPSULE | Freq: Four times a day (QID) | ORAL | 0 refills | Status: DC
Start: 2016-09-23 — End: 2016-10-03

## 2016-09-23 MED ORDER — LIDOCAINE-EPINEPHRINE (PF) 2 %-1:200000 IJ SOLN
10.0000 mL | Freq: Once | INTRAMUSCULAR | Status: AC
  Administered 2016-09-23: 10 mL
  Filled 2016-09-23: qty 20

## 2016-09-23 MED ORDER — DOXYCYCLINE HYCLATE 100 MG PO TABS
100.0000 mg | ORAL_TABLET | Freq: Once | ORAL | Status: AC
  Administered 2016-09-23: 100 mg via ORAL
  Filled 2016-09-23: qty 1

## 2016-09-23 NOTE — ED Provider Notes (Signed)
[facility] DEPT Provider Note   CSN: [id number] Arrival date & time: 09/22/16  2359     History   Chief Complaint Chief Complaint  Patient presents with  . Abscess    HPI [name] is a 76 y.o. male.  The history is provided by the patient.  Abscess  Abscess location: buttock. Abscess quality: induration, painful and redness   Duration:  1 month Progression:  Worsening Pain details:    Quality:  Dull   Severity:  Moderate   Timing:  Constant   Progression:  Worsening Chronicity:  New Relieved by:  Nothing Exacerbated by: palpation. Associated symptoms: no fever and no vomiting   pt reports abscess to left buttock for past month that is worsening over past several days No fever/vomiting No abd pain He is able to have bowel movements without pain   Past Medical History:  Diagnosis Date  . Anemia    takes Ferrous Sulfate daily  . Arthritis    "all over"  . Atrial flutter ([place])    a. 07/2010 Status post caval tricuspid isthmus ablation by Dr. Midge Aver Metoprolol daily  . Balance problem 01/2014  . CAP (community acquired pneumonia) 09/18/2014  . Cervical radiculopathy due to degenerative joint disease of spine   . COPD (chronic obstructive pulmonary disease) ([place])   . Coronary artery disease, non-occlusive    a. 03/2010 Nonocclusive disease by cath, performed for ST elevations on ECG;  b. 06/2013 Lexi MV: EF 60%, no ischemia.  . Diabetes mellitus type II    takes Metformin and Lantus daily  . Diastolic CHF, chronic ([place])    a. 12/2012 EF 55-60%, diast dysfxn, triv MR, mildly dil LA/RA.  [name] Dysrhythmia    HX OF ATRIAL IFB /FLUTTER takes Flecanide and Coumadin daily  . History of blood transfusion 1982   "when I had stomach OR"  . History of bronchitis    1998  . History of gastric ulcer   . HTN (hypertension)    takes Diltiazem daily  . Hyperlipidemia    takes Pravastatin daily  . Joint pain   . PAF (paroxysmal atrial fibrillation) ([place])    a.  Recurrent after atrial flutter, currently controlled on flecainide plus diltiazem  . Pneumonia 1999  . Weakness    numbness and tingling both hands    Patient Active Problem List   Diagnosis Date Noted  . Heme positive stool   . Symptomatic anemia 07/10/2016  . Syncope 05/15/2015  . Cervical disc disease 05/15/2015  . Abnormal urinalysis 05/15/2015  . Anemia 05/15/2015  . History of CVA (cerebrovascular accident) 04/03/2014  . Near syncope 02/15/2014  . Pre-syncope 02/15/2014  . Benign neoplasm of rectum and anal canal 12/02/2013  . Lower gastrointestinal bleeding 11/30/2013  . Tobacco use disorder 11/30/2013  . PAF (paroxysmal atrial fibrillation), maintaining SR; CHA2DS2Vasc = 7 08/15/2013  . Anticoagulation goal of INR 2 to 3, for PAF - CHA2DS2Vasc = 7 08/15/2013  . Type 2 diabetes mellitus ([place]) 01/22/2013  . Tobacco abuse counseling 12/15/2012  . Hyperlipidemia   . Obesity (BMI 30-39.9) 12/14/2012  . Atrial flutter - status post CTI ablation 07/29/2010  . Essential hypertension 07/29/2010  . Chronic diastolic congestive heart failure ([place]) 07/29/2010  . Coronary artery disease, non-occlusive 07/29/2010    Past Surgical History:  Procedure Laterality Date  . ATRIAL ABLATION SURGERY  08/05/10   CTI ablation for atrial flutter by JA  . CARDIAC CATHETERIZATION  2012   nl LV function, no occlusive CAD,  PAF  . CARDIOVERSION  12/07/2010    Successful direct current cardioversion with atrial fibrillation to normal sinus rhythm  . CARPAL TUNNEL RELEASE Bilateral 01/30/2014   Procedure: BILATERAL CARPAL TUNNEL RELEASE;  Surgeon: Marianna Payment, MD;  Location: [facility];  Service: Orthopedics;  Laterality: Bilateral;  . CATARACT EXTRACTION W/ INTRAOCULAR LENS  IMPLANT, BILATERAL Bilateral   . COLONOSCOPY [name] 12/02/2013   Procedure: COLONOSCOPY;  Surgeon: Irene Shipper, MD;  Location: [facility];  Service: Endoscopy;  Laterality: [name];  . ESOPHAGOGASTRODUODENOSCOPY [name] 09/22/2014    Procedure: ESOPHAGOGASTRODUODENOSCOPY (EGD);  Surgeon: Ronald Lobo, MD;  Location[facility] ENDOSCOPY;  Service: Endoscopy;  Laterality: [name];  . ESOPHAGOGASTRODUODENOSCOPY [name] 07/11/2016   Procedure: ESOPHAGOGASTRODUODENOSCOPY (EGD);  Surgeon: Doran Stabler, MD;  Location: [facility] ENDOSCOPY;  Service: Endoscopy;  Laterality: [name];  . INCISION AND DRAINAGE ABSCESS / HEMATOMA OF BURSA / KNEE / THIGH Left 1998   knee  . KNEE BURSECTOMY Left 1998  . LAPAROSCOPIC CHOLECYSTECTOMY  03/2010  . NM MYOVIEW LTD  07/22/2013   Normal EF ~60%, no ischemia or infarction.  [name] PARTIAL GASTRECTOMY  1982   subtotal; "took out 30% for ulcers"  . TRANSTHORACIC ECHOCARDIOGRAM  02/16/2014   EF 60%, no RWMA. - otherwise normal  . YAG LASER APPLICATION Bilateral        Home Medications    Prior to Admission medications   Medication Sig Start Date End Date Taking? Authorizing Provider  acetaminophen (TYLENOL) 650 MG CR tablet Take 650 mg by mouth 2 (two) times daily as needed for pain.   Yes [provider]  diltiazem (CARDIZEM CD) 120 MG 24 hr capsule Take 1 capsule (120 mg total) by mouth daily. 03/19/15  Yes Kilroy, Luke K, PA-C  ferrous sulfate 325 (65 FE) MG tablet Take 325 mg by mouth daily with breakfast.   Yes [provider]  flecainide (TAMBOCOR) 150 MG tablet TAKE 1 AND 1/2 TABLETS TWICE DAILY 08/14/16  Yes Leonie Man, MD  Insulin Glargine (LANTUS) 100 UNIT/ML Solostar Pen Inject 8 Units into the skin at bedtime. Patient taking differently: Inject 8 Units into the skin 2 (two) times daily.  07/12/16  Yes Holley Raring, MD  loratadine (CLARITIN) 10 MG tablet Take 10 mg by mouth every evening.    Yes [provider]  metFORMIN (GLUCOPHAGE) 1000 MG tablet Take 1,000 mg by mouth 2 (two) times daily with a meal.    Yes [provider]  metoprolol succinate (TOPROL-XL) 25 MG 24 hr tablet TAKE 1/2 TABLET DAILY. (NEED MD APPT FOR FURTHER REFILLS) Patient taking differently:  TAKE 1/2 TABLET (12.5 MG) DAILY. (NEED MD APPT FOR FURTHER REFILLS) 06/13/16  Yes Leonie Man, MD  Multiple Vitamin (MULTIVITAMIN WITH MINERALS) TABS tablet Take 1 tablet by mouth 2 (two) times daily.    Yes [provider]  nitroGLYCERIN (NITROSTAT) 0.4 MG SL tablet Place 1 tablet (0.4 mg total) under the tongue every 5 (five) minutes x 3 doses as needed for chest pain. 11/19/15  Yes Kilroy, Lurena Joiner K, PA-C  pravastatin (PRAVACHOL) 40 MG tablet TAKE 1 TABLET EVERY EVENING 09/22/16  Yes Leonie Man, MD  warfarin (COUMADIN) 5 MG tablet Take 5-7.5 mg by mouth See admin instructions. Take 1 tablet on Monday, Wednesday and Friday then take 1 and 1/2 tablets all the other days   Yes [provider]  diclofenac sodium (VOLTAREN) 1 % GEL Apply 2 g topically 4 (four) times daily. Patient not taking: Reported on  09/23/2016 07/12/16   Holley Raring, MD  HYDROcodone-acetaminophen (NORCO/VICODIN) 5-325 MG tablet Take 0.5 tablets by mouth every 4 (four) hours as needed for severe pain. Patient not taking: Reported on 0/63/0160 04/04/30   Delora Fuel, MD  pantoprazole (PROTONIX) 40 MG tablet Take 1 tablet (40 mg total) by mouth 2 (two) times daily. Patient not taking: Reported on 09/23/2016 07/12/16   Holley Raring, MD  warfarin (COUMADIN) 5 MG tablet TAKE 1 TO 1 AND 1/2 TABLETS DAILY AS DIRECTED Patient not taking: Reported on 09/23/2016 08/29/16   Troy Sine, MD    Family History Family History  Problem Relation Age of Onset  . Cancer Mother   . Heart attack Father   . Heart attack Brother     Social History Social History  Substance Use Topics  . Smoking status: Current Every Day Smoker    Packs/day: 0.50    Years: 55.00    Types: Cigarettes  . Smokeless tobacco: Never Used     Comment: He's been smoking between 0.5 and 2 ppd since age 38.  [name] Alcohol use No     Comment: "quit drinking in 1986"     Allergies   Patient has no known allergies.   Review of  Systems Review of Systems  Constitutional: Negative for fever.  Gastrointestinal: Negative for rectal pain and vomiting.  Skin: Positive for wound.  All other systems reviewed and are negative.    Physical Exam Updated Vital Signs BP (!) 121/52 (BP Location: Right Arm)   Pulse 77   Temp 98.5 F (36.9 C) (Oral)   Resp 16   Ht 1.676 m (5\' 6" )   Wt 78.5 kg (173 lb)   SpO2 100%   BMI 27.92 kg/m   Physical Exam CONSTITUTIONAL: elderly, but no acute distress HEAD: Normocephalic/atraumatic EYES: EOM  ENMT: Mucous membranes moist NECK: supple no meningeal signs CV:   no murmurs/rubs/gallops noted LUNGS: Lungs are clear to auscultation bilaterally, no apparent distress ABDOMEN: soft, nontender GU:no cva tenderness NEURO: Pt is awake/alert/appropriate, moves all extremitiesx4.  No facial droop.   EXTREMITIES: pulses normal/equal, full ROM SKIN: warm, color normal Erythematous abscess with mild fluctuance to left buttock.  No crepitus.  No streaking.  There is no perirectal abscess.  No scrotal tenderness.  No perineal tenderness  PSYCH: no abnormalities of mood noted, alert and oriented to situation   ED Treatments / Results  Labs (all labs ordered are listed, but only abnormal results are displayed) Labs Reviewed  PROTIME-INR - Abnormal; Notable for the following:       Result Value   Prothrombin Time 22.9 (*)    All other components within normal limits  CBC WITH DIFFERENTIAL/PLATELET - Abnormal; Notable for the following:    RBC 3.37 (*)    Hemoglobin 8.5 (*)    HCT 28.2 (*)    MCH 25.2 (*)    RDW 17.4 (*)    All other components within normal limits  BASIC METABOLIC PANEL - Abnormal; Notable for the following:    Glucose, Bld 118 (*)    Calcium 8.7 (*)    All other components within normal limits    EKG  EKG Interpretation None       Radiology No results found.  Procedures Procedures   INCISION AND DRAINAGE Performed by: Sharyon Cable Consent:  Verbal consent obtained. Risks and benefits: risks, benefits and alternatives were discussed Type: abscess  Body area: LEFT BUTTOCKS  Anesthesia: local infiltration  Incision was made with  a scalpel.  Local anesthetic: lidocaine % with epinephrine  Anesthetic total: 5 ml  Complexity: complex Blunt dissection to break up loculations  Drainage: purulent  Drainage amount: moderate Patient tolerance: Patient tolerated the procedure well with no immediate complications.    Medications Ordered in ED Medications  HYDROcodone-acetaminophen (NORCO/VICODIN) 5-325 MG per tablet 1 tablet (1 tablet Oral Given 09/23/16 6389)  doxycycline (VIBRA-TABS) tablet 100 mg (100 mg Oral Given 09/23/16 3734)  lidocaine-EPINEPHrine (XYLOCAINE W/EPI) 2 %-1:200000 (PF) injection 10 mL (10 mLs Other Given 09/23/16 2876)     Initial Impression / Assessment and Plan / ED Course  I have reviewed the triage vital signs and the nursing notes.  Pertinent labs results that were available during my care of the patient were reviewed by me and considered in my medical decision making (see chart for details).     Pt well appearing He tolerated I&D No significant blood loss He had moderate amt of pus extracted and swelling improved No signs of deep/perirectal abscess Will start on clindamycin due to he is on coumadin and concern for interactions Advised wound care, and we discussed strict return precautions Advised f/u for his subtherapeutic INR  Final Clinical Impressions(s) / ED Diagnoses   Final diagnoses:  Abscess of left buttock    New Prescriptions Discharge Medication List as of 09/23/2016  7:40 AM    START taking these medications   Details  clindamycin (CLEOCIN) 300 MG capsule Take 1 capsule (300 mg total) by mouth 4 (four) times daily. X 7 days, Starting Sat 09/23/2016, Print         Ripley Fraise, MD 09/23/16 [phone]

## 2016-09-23 NOTE — ED Notes (Signed)
EDP at bedside with I&D.

## 2016-09-23 NOTE — Discharge Instructions (Signed)
YOUR COUMADIN LEVEL IS 1.9 (INR) PLEASE FOLLOWUP WITH YOUR HEART DOCTOR ABOUT THIS  KEEP AREA CLEAN/DRY CHANGE BANDAGE EACH DAY RETURN FOR WORSENED PAIN/REDNESS/SWELLING

## 2016-09-23 NOTE — ED Triage Notes (Signed)
Pt presents reporting abscess to L inner buttocks. Now states the pain is spreading to his L groin area. Approx size of golfball, red and swollen. Tender to touch.

## 2016-09-24 ENCOUNTER — Encounter (HOSPITAL_COMMUNITY): Payer: Self-pay

## 2016-09-24 ENCOUNTER — Emergency Department (HOSPITAL_COMMUNITY)
Admission: EM | Admit: 2016-09-24 | Discharge: 2016-09-24 | Disposition: A | Discharge status: Home / Self Care | Payer: Medicare Other | Attending: Emergency Medicine | Admitting: Emergency Medicine

## 2016-09-24 DIAGNOSIS — F17210 Nicotine dependence, cigarettes, uncomplicated: Secondary | ICD-10-CM | POA: Insufficient documentation

## 2016-09-24 DIAGNOSIS — R58 Hemorrhage, not elsewhere classified: Secondary | ICD-10-CM

## 2016-09-24 DIAGNOSIS — E119 Type 2 diabetes mellitus without complications: Secondary | ICD-10-CM | POA: Diagnosis not present

## 2016-09-24 DIAGNOSIS — I5032 Chronic diastolic (congestive) heart failure: Secondary | ICD-10-CM | POA: Insufficient documentation

## 2016-09-24 DIAGNOSIS — I2510 Atherosclerotic heart disease of native coronary artery without angina pectoris: Secondary | ICD-10-CM | POA: Insufficient documentation

## 2016-09-24 DIAGNOSIS — I10 Essential (primary) hypertension: Secondary | ICD-10-CM | POA: Insufficient documentation

## 2016-09-24 DIAGNOSIS — Z8673 Personal history of transient ischemic attack (TIA), and cerebral infarction without residual deficits: Secondary | ICD-10-CM | POA: Insufficient documentation

## 2016-09-24 DIAGNOSIS — J449 Chronic obstructive pulmonary disease, unspecified: Secondary | ICD-10-CM | POA: Insufficient documentation

## 2016-09-24 DIAGNOSIS — Z7901 Long term (current) use of anticoagulants: Secondary | ICD-10-CM | POA: Insufficient documentation

## 2016-09-24 DIAGNOSIS — I4892 Unspecified atrial flutter: Secondary | ICD-10-CM | POA: Insufficient documentation

## 2016-09-24 DIAGNOSIS — R031 Nonspecific low blood-pressure reading: Secondary | ICD-10-CM | POA: Diagnosis not present

## 2016-09-24 DIAGNOSIS — R040 Epistaxis: Secondary | ICD-10-CM | POA: Insufficient documentation

## 2016-09-24 DIAGNOSIS — Z79899 Other long term (current) drug therapy: Secondary | ICD-10-CM | POA: Diagnosis not present

## 2016-09-24 DIAGNOSIS — Z7984 Long term (current) use of oral hypoglycemic drugs: Secondary | ICD-10-CM | POA: Insufficient documentation

## 2016-09-24 DIAGNOSIS — L7621 Postprocedural hemorrhage and hematoma of skin and subcutaneous tissue following a dermatologic procedure: Secondary | ICD-10-CM | POA: Diagnosis not present

## 2016-09-24 DIAGNOSIS — Z794 Long term (current) use of insulin: Secondary | ICD-10-CM | POA: Diagnosis not present

## 2016-09-24 LAB — CBC
HCT: 27.3 % — ABNORMAL LOW (ref 39.0–52.0)
Hemoglobin: 8.2 g/dL — ABNORMAL LOW (ref 13.0–17.0)
MCH: 24.9 pg — AB (ref 26.0–34.0)
MCHC: 30 g/dL (ref 30.0–36.0)
MCV: 83 fL (ref 78.0–100.0)
PLATELETS: 305 10*3/uL (ref 150–400)
RBC: 3.29 MIL/uL — AB (ref 4.22–5.81)
RDW: 17.3 % — AB (ref 11.5–15.5)
WBC: 7.6 10*3/uL (ref 4.0–10.5)

## 2016-09-24 LAB — PROTIME-INR
INR: 1.92
PROTHROMBIN TIME: 22.3 s — AB (ref 11.4–15.2)

## 2016-09-24 NOTE — ED Provider Notes (Signed)
[facility] DEPT Provider Note   CSN: [id number] Arrival date & time: 09/24/16  1732     History   Chief Complaint Chief Complaint  Patient presents with  . Recurrent Skin Infections    I&D day ago   . Nausea    HPI [name] is a 76 y.o. male.  HPI Patient is a 76 year old male who presents to emergency department because of bleeding from his recent left gluteal I and D site.  He underwent incision and drainage in the emergency department yesterday for an abscess of the left gluteal region.  He states he's been doing well at home when he suddenly developed some bleeding from this area.  He is able to control the bleeding with direct pressure.  He reports no bleeding at this time.  No lightheadedness or weakness.  He is on chronic anticoagulation (Coumadin).  He reports his last INR was 1.9.  He has no other complaints at this time   Past Medical History:  Diagnosis Date  . Anemia    takes Ferrous Sulfate daily  . Arthritis    "all over"  . Atrial flutter ([place])    a. 07/2010 Status post caval tricuspid isthmus ablation by Dr. Midge Aver Metoprolol daily  . Balance problem 01/2014  . CAP (community acquired pneumonia) 09/18/2014  . Cervical radiculopathy due to degenerative joint disease of spine   . COPD (chronic obstructive pulmonary disease) ([place])   . Coronary artery disease, non-occlusive    a. 03/2010 Nonocclusive disease by cath, performed for ST elevations on ECG;  b. 06/2013 Lexi MV: EF 60%, no ischemia.  . Diabetes mellitus type II    takes Metformin and Lantus daily  . Diastolic CHF, chronic ([place])    a. 12/2012 EF 55-60%, diast dysfxn, triv MR, mildly dil LA/RA.  [name] Dysrhythmia    HX OF ATRIAL IFB /FLUTTER takes Flecanide and Coumadin daily  . History of blood transfusion 1982   "when I had stomach OR"  . History of bronchitis    1998  . History of gastric ulcer   . HTN (hypertension)    takes Diltiazem daily  . Hyperlipidemia    takes Pravastatin  daily  . Joint pain   . PAF (paroxysmal atrial fibrillation) ([place])    a. Recurrent after atrial flutter, currently controlled on flecainide plus diltiazem  . Pneumonia 1999  . Weakness    numbness and tingling both hands    Patient Active Problem List   Diagnosis Date Noted  . Heme positive stool   . Symptomatic anemia 07/10/2016  . Syncope 05/15/2015  . Cervical disc disease 05/15/2015  . Abnormal urinalysis 05/15/2015  . Anemia 05/15/2015  . History of CVA (cerebrovascular accident) 04/03/2014  . Near syncope 02/15/2014  . Pre-syncope 02/15/2014  . Benign neoplasm of rectum and anal canal 12/02/2013  . Lower gastrointestinal bleeding 11/30/2013  . Tobacco use disorder 11/30/2013  . PAF (paroxysmal atrial fibrillation), maintaining SR; CHA2DS2Vasc = 7 08/15/2013  . Anticoagulation goal of INR 2 to 3, for PAF - CHA2DS2Vasc = 7 08/15/2013  . Type 2 diabetes mellitus ([place]) 01/22/2013  . Tobacco abuse counseling 12/15/2012  . Hyperlipidemia   . Obesity (BMI 30-39.9) 12/14/2012  . Atrial flutter - status post CTI ablation 07/29/2010  . Essential hypertension 07/29/2010  . Chronic diastolic congestive heart failure ([place]) 07/29/2010  . Coronary artery disease, non-occlusive 07/29/2010    Past Surgical History:  Procedure Laterality Date  . ATRIAL ABLATION SURGERY  08/05/10   CTI  ablation for atrial flutter by JA  . CARDIAC CATHETERIZATION  2012   nl LV function, no occlusive CAD, PAF  . CARDIOVERSION  12/07/2010    Successful direct current cardioversion with atrial fibrillation to normal sinus rhythm  . CARPAL TUNNEL RELEASE Bilateral 01/30/2014   Procedure: BILATERAL CARPAL TUNNEL RELEASE;  Surgeon: Marianna Payment, MD;  Location: [place];  Service: Orthopedics;  Laterality: Bilateral;  . CATARACT EXTRACTION W/ INTRAOCULAR LENS  IMPLANT, BILATERAL Bilateral   . COLONOSCOPY [name] 12/02/2013   Procedure: COLONOSCOPY;  Surgeon: Irene Shipper, MD;  Location: [place];  Service:  Endoscopy;  Laterality: [name];  . ESOPHAGOGASTRODUODENOSCOPY [name] 09/22/2014   Procedure: ESOPHAGOGASTRODUODENOSCOPY (EGD);  Surgeon: Ronald Lobo, MD;  Location[facility] ENDOSCOPY;  Service: Endoscopy;  Laterality: [name];  . ESOPHAGOGASTRODUODENOSCOPY [name] 07/11/2016   Procedure: ESOPHAGOGASTRODUODENOSCOPY (EGD);  Surgeon: Doran Stabler, MD;  Location: [facility] ENDOSCOPY;  Service: Endoscopy;  Laterality: [name];  . INCISION AND DRAINAGE ABSCESS / HEMATOMA OF BURSA / KNEE / THIGH Left 1998   knee  . KNEE BURSECTOMY Left 1998  . LAPAROSCOPIC CHOLECYSTECTOMY  03/2010  . NM MYOVIEW LTD  07/22/2013   Normal EF ~60%, no ischemia or infarction.  [name] PARTIAL GASTRECTOMY  1982   subtotal; "took out 30% for ulcers"  . TRANSTHORACIC ECHOCARDIOGRAM  02/16/2014   EF 60%, no RWMA. - otherwise normal  . YAG LASER APPLICATION Bilateral        Home Medications    Prior to Admission medications   Medication Sig Start Date End Date Taking? Authorizing Provider  acetaminophen (TYLENOL) 650 MG CR tablet Take 650 mg by mouth 2 (two) times daily as needed for pain.    [provider]  clindamycin (CLEOCIN) 300 MG capsule Take 1 capsule (300 mg total) by mouth 4 (four) times daily. X 7 days 09/23/16   Ripley Fraise, MD  diclofenac sodium (VOLTAREN) 1 % GEL Apply 2 g topically 4 (four) times daily. Patient not taking: Reported on 09/23/2016 07/12/16   Holley Raring, MD  diltiazem (CARDIZEM CD) 120 MG 24 hr capsule Take 1 capsule (120 mg total) by mouth daily. 03/19/15   Erlene Quan, PA-C  ferrous sulfate 325 (65 FE) MG tablet Take 325 mg by mouth daily with breakfast.    [provider]  flecainide (TAMBOCOR) 150 MG tablet TAKE 1 AND 1/2 TABLETS TWICE DAILY 08/14/16   Leonie Man, MD  Insulin Glargine (LANTUS) 100 UNIT/ML Solostar Pen Inject 8 Units into the skin at bedtime. Patient taking differently: Inject 8 Units into the skin 2 (two) times daily.  07/12/16   Holley Raring, MD  loratadine  (CLARITIN) 10 MG tablet Take 10 mg by mouth every evening.     [provider]  metFORMIN (GLUCOPHAGE) 1000 MG tablet Take 1,000 mg by mouth 2 (two) times daily with a meal.     [provider]  metoprolol succinate (TOPROL-XL) 25 MG 24 hr tablet TAKE 1/2 TABLET DAILY. (NEED MD APPT FOR FURTHER REFILLS) Patient taking differently: TAKE 1/2 TABLET (12.5 MG) DAILY. (NEED MD APPT FOR FURTHER REFILLS) 06/13/16   Leonie Man, MD  Multiple Vitamin (MULTIVITAMIN WITH MINERALS) TABS tablet Take 1 tablet by mouth 2 (two) times daily.     [provider]  nitroGLYCERIN (NITROSTAT) 0.4 MG SL tablet Place 1 tablet (0.4 mg total) under the tongue every 5 (five) minutes x 3 doses as needed for chest pain. 11/19/15   Erlene Quan, PA-C  pantoprazole (  PROTONIX) 40 MG tablet Take 1 tablet (40 mg total) by mouth 2 (two) times daily. Patient not taking: Reported on 09/23/2016 07/12/16   Holley Raring, MD  pravastatin (PRAVACHOL) 40 MG tablet TAKE 1 TABLET EVERY EVENING 09/22/16   Leonie Man, MD  warfarin (COUMADIN) 5 MG tablet TAKE 1 TO 1 AND 1/2 TABLETS DAILY AS DIRECTED Patient not taking: Reported on 09/23/2016 08/29/16   Troy Sine, MD  warfarin (COUMADIN) 5 MG tablet Take 5-7.5 mg by mouth See admin instructions. Take 1 tablet on Monday, Wednesday and Friday then take 1 and 1/2 tablets all the other days    [provider]    Family History Family History  Problem Relation Age of Onset  . Cancer Mother   . Heart attack Father   . Heart attack Brother     Social History Social History  Substance Use Topics  . Smoking status: Current Every Day Smoker    Packs/day: 0.50    Years: 55.00    Types: Cigarettes  . Smokeless tobacco: Never Used     Comment: He's been smoking between 0.5 and 2 ppd since age 80.  [name] Alcohol use No     Comment: "quit drinking in 1986"     Allergies   Patient has no known allergies.   Review of Systems Review of Systems    All other systems reviewed and are negative.    Physical Exam Updated Vital Signs BP (!) 112/52   Pulse 89   Temp 98.8 F (37.1 C) (Oral)   Resp 16   Ht 5\' 6"  (1.676 m)   Wt 79.4 kg (175 lb)   SpO2 95%   BMI 28.25 kg/m   Physical Exam  Constitutional: He is oriented to person, place, and time. He appears well-developed and well-nourished.  HENT:  Head: Normocephalic.  Eyes: EOM are normal.  Neck: Normal range of motion.  Pulmonary/Chest: Effort normal.  Abdominal: He exhibits no distension.  Musculoskeletal: Normal range of motion.  Healing incision and drainage site of the left gluteal region.  No surrounding erythema or fluctuance.  No bleeding at this time.  No purulent drainage.  Neurological: He is alert and oriented to person, place, and time.  Psychiatric: He has a normal mood and affect.  Nursing note and vitals reviewed.    ED Treatments / Results  Labs (all labs ordered are listed, but only abnormal results are displayed) Labs Reviewed  CBC - Abnormal; Notable for the following:       Result Value   RBC 3.29 (*)    Hemoglobin 8.2 (*)    HCT 27.3 (*)    MCH 24.9 (*)    RDW 17.3 (*)    All other components within normal limits  PROTIME-INR - Abnormal; Notable for the following:    Prothrombin Time 22.3 (*)    All other components within normal limits    EKG  EKG Interpretation None       Radiology No results found.  Procedures Procedures (including critical care time)  Medications Ordered in ED Medications - No data to display   Initial Impression / Assessment and Plan / ED Course  I have reviewed the triage vital signs and the nursing notes.  Pertinent labs & imaging results that were available during my care of the patient were reviewed by me and considered in my medical decision making (see chart for details).     Well-appearing.  Subtherapeutic INR.  No active bleeding at  this time.  Hemoglobin stable for patient.  Discharge home  in good condition.  Hemoglobin  Date Value Ref Range Status  09/24/2016 8.2 (L) 13.0 - 17.0 g/dL Final  09/23/2016 8.5 (L) 13.0 - 17.0 g/dL Final  07/12/2016 9.3 (L) 13.0 - 17.0 g/dL Final  07/11/2016 9.4 (L) 13.0 - 17.0 g/dL Final     Final Clinical Impressions(s) / ED Diagnoses   Final diagnoses:  Acute bleeding    New Prescriptions New Prescriptions   No medications on file     Jola Schmidt, MD 09/24/16 1910

## 2016-09-24 NOTE — ED Triage Notes (Signed)
Patient here after having I&D done on boil on buttocks. Stated today that there was pressure there and began to bleed. Bleeding stopped with a band aid and no bleeding around the band aid.  EMS applied gauze and no active bleeding noted. Stated he got nauseated after the bleeding started.  Vitals stable, A&Ox4

## 2016-09-24 NOTE — ED Notes (Signed)
Pt ambulated to BR with even, steady gait.  

## 2016-09-28 ENCOUNTER — Encounter (HOSPITAL_COMMUNITY): Payer: Self-pay

## 2016-09-28 ENCOUNTER — Emergency Department (HOSPITAL_COMMUNITY)
Admission: EM | Admit: 2016-09-28 | Discharge: 2016-09-29 | Disposition: A | Discharge status: Home / Self Care | Payer: Medicare Other | Source: Home / Self Care | Attending: Emergency Medicine | Admitting: Emergency Medicine

## 2016-09-28 ENCOUNTER — Emergency Department (HOSPITAL_COMMUNITY): Payer: Medicare Other

## 2016-09-28 DIAGNOSIS — Z7984 Long term (current) use of oral hypoglycemic drugs: Secondary | ICD-10-CM

## 2016-09-28 DIAGNOSIS — I4892 Unspecified atrial flutter: Secondary | ICD-10-CM | POA: Diagnosis not present

## 2016-09-28 DIAGNOSIS — J449 Chronic obstructive pulmonary disease, unspecified: Secondary | ICD-10-CM | POA: Insufficient documentation

## 2016-09-28 DIAGNOSIS — Z79899 Other long term (current) drug therapy: Secondary | ICD-10-CM | POA: Insufficient documentation

## 2016-09-28 DIAGNOSIS — M546 Pain in thoracic spine: Secondary | ICD-10-CM

## 2016-09-28 DIAGNOSIS — K5900 Constipation, unspecified: Secondary | ICD-10-CM | POA: Diagnosis not present

## 2016-09-28 DIAGNOSIS — D689 Coagulation defect, unspecified: Secondary | ICD-10-CM | POA: Diagnosis not present

## 2016-09-28 DIAGNOSIS — I10 Essential (primary) hypertension: Secondary | ICD-10-CM

## 2016-09-28 DIAGNOSIS — E119 Type 2 diabetes mellitus without complications: Secondary | ICD-10-CM

## 2016-09-28 DIAGNOSIS — D649 Anemia, unspecified: Secondary | ICD-10-CM | POA: Insufficient documentation

## 2016-09-28 DIAGNOSIS — D62 Acute posthemorrhagic anemia: Secondary | ICD-10-CM | POA: Diagnosis not present

## 2016-09-28 DIAGNOSIS — K921 Melena: Secondary | ICD-10-CM | POA: Diagnosis not present

## 2016-09-28 DIAGNOSIS — K5730 Diverticulosis of large intestine without perforation or abscess without bleeding: Secondary | ICD-10-CM | POA: Diagnosis not present

## 2016-09-28 DIAGNOSIS — I509 Heart failure, unspecified: Secondary | ICD-10-CM | POA: Insufficient documentation

## 2016-09-28 DIAGNOSIS — R109 Unspecified abdominal pain: Secondary | ICD-10-CM | POA: Diagnosis not present

## 2016-09-28 DIAGNOSIS — I2510 Atherosclerotic heart disease of native coronary artery without angina pectoris: Secondary | ICD-10-CM

## 2016-09-28 DIAGNOSIS — F17210 Nicotine dependence, cigarettes, uncomplicated: Secondary | ICD-10-CM | POA: Insufficient documentation

## 2016-09-28 DIAGNOSIS — R05 Cough: Secondary | ICD-10-CM | POA: Insufficient documentation

## 2016-09-28 DIAGNOSIS — I5032 Chronic diastolic (congestive) heart failure: Secondary | ICD-10-CM | POA: Diagnosis not present

## 2016-09-28 DIAGNOSIS — M549 Dorsalgia, unspecified: Secondary | ICD-10-CM | POA: Diagnosis not present

## 2016-09-28 DIAGNOSIS — M545 Low back pain: Secondary | ICD-10-CM | POA: Diagnosis not present

## 2016-09-28 DIAGNOSIS — R195 Other fecal abnormalities: Secondary | ICD-10-CM | POA: Diagnosis not present

## 2016-09-28 LAB — CBC WITH DIFFERENTIAL/PLATELET
Basophils Absolute: 0 K/uL (ref 0.0–0.1)
Basophils Relative: 0 %
Eosinophils Absolute: 0 K/uL (ref 0.0–0.7)
Eosinophils Relative: 0 %
HCT: 29 % — ABNORMAL LOW (ref 39.0–52.0)
Hemoglobin: 8.7 g/dL — ABNORMAL LOW (ref 13.0–17.0)
Lymphocytes Relative: 8 %
Lymphs Abs: 0.9 K/uL (ref 0.7–4.0)
MCH: 24.6 pg — ABNORMAL LOW (ref 26.0–34.0)
MCHC: 30 g/dL (ref 30.0–36.0)
MCV: 82.2 fL (ref 78.0–100.0)
Monocytes Absolute: 0.9 K/uL (ref 0.1–1.0)
Monocytes Relative: 9 %
Neutro Abs: 8.9 K/uL — ABNORMAL HIGH (ref 1.7–7.7)
Neutrophils Relative %: 83 %
Platelets: 361 K/uL (ref 150–400)
RBC: 3.53 MIL/uL — ABNORMAL LOW (ref 4.22–5.81)
RDW: 17.5 % — ABNORMAL HIGH (ref 11.5–15.5)
WBC: 10.8 K/uL — ABNORMAL HIGH (ref 4.0–10.5)

## 2016-09-28 MED ORDER — MORPHINE SULFATE (PF) 4 MG/ML IV SOLN
2.0000 mg | Freq: Once | INTRAVENOUS | Status: AC
  Administered 2016-09-28: 2 mg via INTRAVENOUS
  Filled 2016-09-28: qty 1

## 2016-09-28 NOTE — ED Notes (Signed)
Patient transported to X-ray 

## 2016-09-28 NOTE — ED Provider Notes (Signed)
[facility] DEPT Provider Note   CSN: [id number] Arrival date & time: 09/28/16  1942     History   Chief Complaint Chief Complaint  Patient presents with  . Flank Pain    HPI [name] is a 76 y.o. male.  Patient with a history of DM, CHF, COPD, atrial fibrillation s/p ablation, coagulopathy on Coumadin, HTN, HLD, presents with pain in the left flank that goes upward to bilateral posterior shoulders and down the right side back. The symptoms started yesterday. The patient is a vague historian who reports constant pain as well as pain that goes away and returns. He has a cough that makes the pain worse. No nausea, vomiting, diarrhea, abdominal pain or chest pain. He is urinating normally. No loss of appetite.    The history is provided by the patient. No language interpreter was used.  Flank Pain  Pertinent negatives include no chest pain and no abdominal pain.    Past Medical History:  Diagnosis Date  . Anemia    takes Ferrous Sulfate daily  . Arthritis    "all over"  . Atrial flutter ([place])    a. 07/2010 Status post caval tricuspid isthmus ablation by Dr. Midge Aver Metoprolol daily  . Balance problem 01/2014  . CAP (community acquired pneumonia) 09/18/2014  . Cervical radiculopathy due to degenerative joint disease of spine   . COPD (chronic obstructive pulmonary disease) ([place])   . Coronary artery disease, non-occlusive    a. 03/2010 Nonocclusive disease by cath, performed for ST elevations on ECG;  b. 06/2013 Lexi MV: EF 60%, no ischemia.  . Diabetes mellitus type II    takes Metformin and Lantus daily  . Diastolic CHF, chronic ([place])    a. 12/2012 EF 55-60%, diast dysfxn, triv MR, mildly dil LA/RA.  [name] Dysrhythmia    HX OF ATRIAL IFB /FLUTTER takes Flecanide and Coumadin daily  . History of blood transfusion 1982   "when I had stomach OR"  . History of bronchitis    1998  . History of gastric ulcer   . HTN (hypertension)    takes Diltiazem daily  .  Hyperlipidemia    takes Pravastatin daily  . Joint pain   . PAF (paroxysmal atrial fibrillation) ([place])    a. Recurrent after atrial flutter, currently controlled on flecainide plus diltiazem  . Pneumonia 1999  . Weakness    numbness and tingling both hands    Patient Active Problem List   Diagnosis Date Noted  . Heme positive stool   . Symptomatic anemia 07/10/2016  . Syncope 05/15/2015  . Cervical disc disease 05/15/2015  . Abnormal urinalysis 05/15/2015  . Anemia 05/15/2015  . History of CVA (cerebrovascular accident) 04/03/2014  . Near syncope 02/15/2014  . Pre-syncope 02/15/2014  . Benign neoplasm of rectum and anal canal 12/02/2013  . Lower gastrointestinal bleeding 11/30/2013  . Tobacco use disorder 11/30/2013  . PAF (paroxysmal atrial fibrillation), maintaining SR; CHA2DS2Vasc = 7 08/15/2013  . Anticoagulation goal of INR 2 to 3, for PAF - CHA2DS2Vasc = 7 08/15/2013  . Type 2 diabetes mellitus ([place]) 01/22/2013  . Tobacco abuse counseling 12/15/2012  . Hyperlipidemia   . Obesity (BMI 30-39.9) 12/14/2012  . Atrial flutter - status post CTI ablation 07/29/2010  . Essential hypertension 07/29/2010  . Chronic diastolic congestive heart failure ([place]) 07/29/2010  . Coronary artery disease, non-occlusive 07/29/2010    Past Surgical History:  Procedure Laterality Date  . ATRIAL ABLATION SURGERY  08/05/10   CTI ablation for atrial  flutter by JA  . CARDIAC CATHETERIZATION  2012   nl LV function, no occlusive CAD, PAF  . CARDIOVERSION  12/07/2010    Successful direct current cardioversion with atrial fibrillation to normal sinus rhythm  . CARPAL TUNNEL RELEASE Bilateral 01/30/2014   Procedure: BILATERAL CARPAL TUNNEL RELEASE;  Surgeon: Marianna Payment, MD;  Location: [facility];  Service: Orthopedics;  Laterality: Bilateral;  . CATARACT EXTRACTION W/ INTRAOCULAR LENS  IMPLANT, BILATERAL Bilateral   . COLONOSCOPY [name] 12/02/2013   Procedure: COLONOSCOPY;  Surgeon: Irene Shipper, MD;   Location: [facility];  Service: Endoscopy;  Laterality: [name];  . ESOPHAGOGASTRODUODENOSCOPY [name] 09/22/2014   Procedure: ESOPHAGOGASTRODUODENOSCOPY (EGD);  Surgeon: Ronald Lobo, MD;  Location[facility] ENDOSCOPY;  Service: Endoscopy;  Laterality: [name];  . ESOPHAGOGASTRODUODENOSCOPY [name] 07/11/2016   Procedure: ESOPHAGOGASTRODUODENOSCOPY (EGD);  Surgeon: Doran Stabler, MD;  Location[facility] ENDOSCOPY;  Service: Endoscopy;  Laterality: [name];  . INCISION AND DRAINAGE ABSCESS / HEMATOMA OF BURSA / KNEE / THIGH Left 1998   knee  . KNEE BURSECTOMY Left 1998  . LAPAROSCOPIC CHOLECYSTECTOMY  03/2010  . NM MYOVIEW LTD  07/22/2013   Normal EF ~60%, no ischemia or infarction.  [name] PARTIAL GASTRECTOMY  1982   subtotal; "took out 30% for ulcers"  . TRANSTHORACIC ECHOCARDIOGRAM  02/16/2014   EF 60%, no RWMA. - otherwise normal  . YAG LASER APPLICATION Bilateral        Home Medications    Prior to Admission medications   Medication Sig Start Date End Date Taking? Authorizing Provider  acetaminophen (TYLENOL) 650 MG CR tablet Take 650 mg by mouth 2 (two) times daily as needed for pain.    [provider]  clindamycin (CLEOCIN) 300 MG capsule Take 1 capsule (300 mg total) by mouth 4 (four) times daily. X 7 days 09/23/16   Ripley Fraise, MD  diclofenac sodium (VOLTAREN) 1 % GEL Apply 2 g topically 4 (four) times daily. Patient not taking: Reported on 09/23/2016 07/12/16   Holley Raring, MD  diltiazem (CARDIZEM CD) 120 MG 24 hr capsule Take 1 capsule (120 mg total) by mouth daily. 03/19/15   Erlene Quan, PA-C  ferrous sulfate 325 (65 FE) MG tablet Take 325 mg by mouth daily with breakfast.    [provider]  flecainide (TAMBOCOR) 150 MG tablet TAKE 1 AND 1/2 TABLETS TWICE DAILY 08/14/16   Leonie Man, MD  Insulin Glargine (LANTUS) 100 UNIT/ML Solostar Pen Inject 8 Units into the skin at bedtime. Patient taking differently: Inject 8 Units into the skin 2 (two) times daily.  07/12/16    Holley Raring, MD  loratadine (CLARITIN) 10 MG tablet Take 10 mg by mouth every evening.     [provider]  metFORMIN (GLUCOPHAGE) 1000 MG tablet Take 1,000 mg by mouth 2 (two) times daily with a meal.     [provider]  metoprolol succinate (TOPROL-XL) 25 MG 24 hr tablet TAKE 1/2 TABLET DAILY. (NEED MD APPT FOR FURTHER REFILLS) Patient taking differently: TAKE 1/2 TABLET (12.5 MG) DAILY. (NEED MD APPT FOR FURTHER REFILLS) 06/13/16   Leonie Man, MD  Multiple Vitamin (MULTIVITAMIN WITH MINERALS) TABS tablet Take 1 tablet by mouth 2 (two) times daily.     [provider]  nitroGLYCERIN (NITROSTAT) 0.4 MG SL tablet Place 1 tablet (0.4 mg total) under the tongue every 5 (five) minutes x 3 doses as needed for chest pain. 11/19/15   Erlene Quan, PA-C  pantoprazole (PROTONIX) 40 MG  tablet Take 1 tablet (40 mg total) by mouth 2 (two) times daily. Patient not taking: Reported on 09/23/2016 07/12/16   Holley Raring, MD  pravastatin (PRAVACHOL) 40 MG tablet TAKE 1 TABLET EVERY EVENING 09/22/16   Leonie Man, MD  warfarin (COUMADIN) 5 MG tablet TAKE 1 TO 1 AND 1/2 TABLETS DAILY AS DIRECTED Patient not taking: Reported on 09/23/2016 08/29/16   Troy Sine, MD  warfarin (COUMADIN) 5 MG tablet Take 5-7.5 mg by mouth See admin instructions. Take 1 tablet on Monday, Wednesday and Friday then take 1 and 1/2 tablets all the other days    [provider]    Family History Family History  Problem Relation Age of Onset  . Cancer Mother   . Heart attack Father   . Heart attack Brother     Social History Social History  Substance Use Topics  . Smoking status: Current Every Day Smoker    Packs/day: 0.50    Years: 55.00    Types: Cigarettes  . Smokeless tobacco: Never Used     Comment: He's been smoking between 0.5 and 2 ppd since age 64.  [name] Alcohol use No     Comment: "quit drinking in 1986"     Allergies   Patient has no known allergies.   Review  of Systems Review of Systems  Constitutional: Negative for appetite change, chills and fever.  HENT: Negative.   Respiratory: Positive for cough.   Cardiovascular: Negative.  Negative for chest pain.  Gastrointestinal: Negative.  Negative for abdominal pain, diarrhea, nausea and vomiting.  Genitourinary: Positive for flank pain. Negative for dysuria and testicular pain.  Musculoskeletal:       See HPI.  Skin: Negative.  Negative for rash.  Neurological: Negative.  Negative for syncope and weakness.     Physical Exam Updated Vital Signs BP (!) 143/71 (BP Location: Right Arm)   Pulse (!) 106   Temp 98 F (36.7 C) (Oral)   Resp (!) 24   Ht 5\' 6"  (1.676 m)   Wt 77.1 kg (170 lb)   SpO2 98%   BMI 27.44 kg/m   Physical Exam  Constitutional: He is oriented to person, place, and time. He appears well-developed and well-nourished.  Restless, uncomfortable appearing.  HENT:  Head: Normocephalic.  Mouth/Throat: Oropharynx is clear and moist.  Neck: Normal range of motion. Neck supple.  Cardiovascular: Normal rate and regular rhythm.   Pulmonary/Chest: Effort normal and breath sounds normal. He has no wheezes. He has no rales.  Abdominal: Soft. Bowel sounds are normal. There is no tenderness. There is no rebound and no guarding.  Musculoskeletal: Normal range of motion.  Generalized back tenderness to palpation.   Neurological: He is alert and oriented to person, place, and time.  Skin: Skin is warm and dry. No rash noted.  Psychiatric: He has a normal mood and affect.     ED Treatments / Results  Labs (all labs ordered are listed, but only abnormal results are displayed) Labs Reviewed  URINE CULTURE  COMPREHENSIVE METABOLIC PANEL  CBC WITH DIFFERENTIAL/PLATELET  PROTIME-INR  URINALYSIS, ROUTINE W REFLEX MICROSCOPIC  LACTIC ACID, PLASMA  LACTIC ACID, PLASMA  TROPONIN I    EKG  EKG Interpretation None       Radiology No results found.  Procedures Procedures  (including critical care time)  Medications Ordered in ED Medications  morphine 4 MG/ML injection 2 mg (not administered)     Initial Impression / Assessment and Plan / ED  Course  I have reviewed the triage vital signs and the nursing notes.  Pertinent labs & imaging results that were available during my care of the patient were reviewed by me and considered in my medical decision making (see chart for details).     Patient presents with back pain described as going from left flank area, across upper back and down the right side. No fever. He reports cough.  Labs are reassuring. No evidence of infection, pneumonia, stone. Transiently better with Morphine. Will add muscle relaxer. He is examined by Dr. Leonette Monarch and felt appropriate for discharge home. Update provided to patient.   Final Clinical Impressions(s) / ED Diagnoses   Final diagnoses:  None   1. Back pain  New Prescriptions New Prescriptions   No medications on file     Dennie Bible 09/29/16 0122    Fatima Blank, MD 09/30/16 [phone]

## 2016-09-28 NOTE — ED Triage Notes (Signed)
Pt reporting back pain that started yesterday. He pointed to left and right flank areas of his back. He denies urinary trouble or abnormalities, denies recent injury. He denies abdominal pain.

## 2016-09-29 LAB — COMPREHENSIVE METABOLIC PANEL
ALBUMIN: 3.4 g/dL — AB (ref 3.5–5.0)
ALT: 26 U/L (ref 17–63)
ANION GAP: 10 (ref 5–15)
AST: 25 U/L (ref 15–41)
Alkaline Phosphatase: 78 U/L (ref 38–126)
BUN: 15 mg/dL (ref 6–20)
CHLORIDE: 98 mmol/L — AB (ref 101–111)
CO2: 23 mmol/L (ref 22–32)
Calcium: 9.6 mg/dL (ref 8.9–10.3)
Creatinine, Ser: 0.86 mg/dL (ref 0.61–1.24)
GFR calc non Af Amer: >60 mL/min (ref >60)
GLUCOSE: 140 mg/dL — AB (ref 65–99)
Potassium: 3.9 mmol/L (ref 3.5–5.1)
SODIUM: 131 mmol/L — AB (ref 135–145)
Total Bilirubin: 0.3 mg/dL (ref 0.3–1.2)
Total Protein: 6.4 g/dL — ABNORMAL LOW (ref 6.5–8.1)

## 2016-09-29 LAB — URINALYSIS, ROUTINE W REFLEX MICROSCOPIC
BILIRUBIN URINE: NEGATIVE
GLUCOSE, UA: NEGATIVE mg/dL
HGB URINE DIPSTICK: NEGATIVE
KETONES UR: NEGATIVE mg/dL
NITRITE: NEGATIVE
PH: 6 (ref 5.0–8.0)
Protein, ur: NEGATIVE mg/dL
Specific Gravity, Urine: 1.017 (ref 1.005–1.030)

## 2016-09-29 LAB — PROTIME-INR
INR: 1.76
Prothrombin Time: 20.8 seconds — ABNORMAL HIGH (ref 11.4–15.2)

## 2016-09-29 LAB — LACTIC ACID, PLASMA: Lactic Acid, Venous: 1.4 mmol/L (ref 0.5–1.9)

## 2016-09-29 LAB — TROPONIN I: Troponin I: <0.03 ng/mL (ref <0.03)

## 2016-09-29 MED ORDER — CYCLOBENZAPRINE HCL 5 MG PO TABS: 10.0000 mg | ORAL_TABLET | Freq: Three times a day (TID) | ORAL | 0 refills | Status: DC | PRN

## 2016-09-29 MED ORDER — CYCLOBENZAPRINE HCL 10 MG PO TABS
5.0000 mg | ORAL_TABLET | Freq: Once | ORAL | Status: AC
  Administered 2016-09-29: 5 mg via ORAL
  Filled 2016-09-29: qty 1

## 2016-09-29 MED ORDER — MORPHINE SULFATE (PF) 4 MG/ML IV SOLN
2.0000 mg | Freq: Once | INTRAVENOUS | Status: AC
  Administered 2016-09-29: 2 mg via INTRAVENOUS
  Filled 2016-09-29: qty 1

## 2016-09-29 MED ORDER — TRAMADOL HCL 50 MG PO TABS: 50.0000 mg | ORAL_TABLET | Freq: Four times a day (QID) | ORAL | 0 refills | Status: DC | PRN

## 2016-09-30 LAB — URINE CULTURE

## 2016-10-01 ENCOUNTER — Inpatient Hospital Stay (HOSPITAL_COMMUNITY)
Admission: EM | Admit: 2016-10-01 | Discharge: 2016-10-03 | DRG: 392 | Disposition: A | Discharge status: Home Health | Payer: Medicare Other | Attending: Internal Medicine | Admitting: Internal Medicine

## 2016-10-01 ENCOUNTER — Emergency Department (HOSPITAL_COMMUNITY): Payer: Medicare Other

## 2016-10-01 ENCOUNTER — Encounter (HOSPITAL_COMMUNITY): Payer: Self-pay | Admitting: Emergency Medicine

## 2016-10-01 ENCOUNTER — Telehealth: Payer: Self-pay

## 2016-10-01 DIAGNOSIS — I509 Heart failure, unspecified: Secondary | ICD-10-CM | POA: Diagnosis present

## 2016-10-01 DIAGNOSIS — K5900 Constipation, unspecified: Principal | ICD-10-CM | POA: Diagnosis present

## 2016-10-01 DIAGNOSIS — R109 Unspecified abdominal pain: Secondary | ICD-10-CM

## 2016-10-01 DIAGNOSIS — J449 Chronic obstructive pulmonary disease, unspecified: Secondary | ICD-10-CM | POA: Diagnosis present

## 2016-10-01 DIAGNOSIS — D62 Acute posthemorrhagic anemia: Secondary | ICD-10-CM | POA: Diagnosis not present

## 2016-10-01 DIAGNOSIS — R05 Cough: Secondary | ICD-10-CM | POA: Diagnosis present

## 2016-10-01 DIAGNOSIS — Z961 Presence of intraocular lens: Secondary | ICD-10-CM | POA: Diagnosis present

## 2016-10-01 DIAGNOSIS — D689 Coagulation defect, unspecified: Secondary | ICD-10-CM

## 2016-10-01 DIAGNOSIS — I5032 Chronic diastolic (congestive) heart failure: Secondary | ICD-10-CM | POA: Diagnosis not present

## 2016-10-01 DIAGNOSIS — R1032 Left lower quadrant pain: Secondary | ICD-10-CM

## 2016-10-01 DIAGNOSIS — Z9841 Cataract extraction status, right eye: Secondary | ICD-10-CM

## 2016-10-01 DIAGNOSIS — F17210 Nicotine dependence, cigarettes, uncomplicated: Secondary | ICD-10-CM | POA: Diagnosis present

## 2016-10-01 DIAGNOSIS — I110 Hypertensive heart disease with heart failure: Secondary | ICD-10-CM | POA: Diagnosis present

## 2016-10-01 DIAGNOSIS — E119 Type 2 diabetes mellitus without complications: Secondary | ICD-10-CM | POA: Diagnosis present

## 2016-10-01 DIAGNOSIS — R1084 Generalized abdominal pain: Secondary | ICD-10-CM | POA: Diagnosis not present

## 2016-10-01 DIAGNOSIS — K5730 Diverticulosis of large intestine without perforation or abscess without bleeding: Secondary | ICD-10-CM | POA: Diagnosis not present

## 2016-10-01 DIAGNOSIS — Z794 Long term (current) use of insulin: Secondary | ICD-10-CM | POA: Diagnosis not present

## 2016-10-01 DIAGNOSIS — Z79899 Other long term (current) drug therapy: Secondary | ICD-10-CM

## 2016-10-01 DIAGNOSIS — Z7984 Long term (current) use of oral hypoglycemic drugs: Secondary | ICD-10-CM

## 2016-10-01 DIAGNOSIS — I2510 Atherosclerotic heart disease of native coronary artery without angina pectoris: Secondary | ICD-10-CM | POA: Diagnosis present

## 2016-10-01 DIAGNOSIS — M546 Pain in thoracic spine: Secondary | ICD-10-CM | POA: Diagnosis present

## 2016-10-01 DIAGNOSIS — E782 Mixed hyperlipidemia: Secondary | ICD-10-CM | POA: Diagnosis present

## 2016-10-01 DIAGNOSIS — I10 Essential (primary) hypertension: Secondary | ICD-10-CM | POA: Diagnosis present

## 2016-10-01 DIAGNOSIS — Z9842 Cataract extraction status, left eye: Secondary | ICD-10-CM | POA: Diagnosis not present

## 2016-10-01 DIAGNOSIS — I482 Chronic atrial fibrillation: Secondary | ICD-10-CM | POA: Diagnosis not present

## 2016-10-01 DIAGNOSIS — I480 Paroxysmal atrial fibrillation: Secondary | ICD-10-CM | POA: Diagnosis present

## 2016-10-01 DIAGNOSIS — E785 Hyperlipidemia, unspecified: Secondary | ICD-10-CM | POA: Diagnosis not present

## 2016-10-01 DIAGNOSIS — D649 Anemia, unspecified: Secondary | ICD-10-CM | POA: Diagnosis present

## 2016-10-01 DIAGNOSIS — K649 Unspecified hemorrhoids: Secondary | ICD-10-CM | POA: Diagnosis present

## 2016-10-01 DIAGNOSIS — I4892 Unspecified atrial flutter: Secondary | ICD-10-CM | POA: Diagnosis not present

## 2016-10-01 DIAGNOSIS — R195 Other fecal abnormalities: Secondary | ICD-10-CM | POA: Diagnosis present

## 2016-10-01 DIAGNOSIS — D509 Iron deficiency anemia, unspecified: Secondary | ICD-10-CM | POA: Diagnosis not present

## 2016-10-01 DIAGNOSIS — K921 Melena: Secondary | ICD-10-CM | POA: Diagnosis present

## 2016-10-01 DIAGNOSIS — Z7901 Long term (current) use of anticoagulants: Secondary | ICD-10-CM

## 2016-10-01 LAB — CBC
HCT: 27.2 % — ABNORMAL LOW (ref 39.0–52.0)
Hemoglobin: 8.1 g/dL — ABNORMAL LOW (ref 13.0–17.0)
MCH: 24 pg — ABNORMAL LOW (ref 26.0–34.0)
MCHC: 29.8 g/dL — ABNORMAL LOW (ref 30.0–36.0)
MCV: 80.5 fL (ref 78.0–100.0)
Platelets: 347 10*3/uL (ref 150–400)
RBC: 3.38 MIL/uL — ABNORMAL LOW (ref 4.22–5.81)
RDW: 17.3 % — ABNORMAL HIGH (ref 11.5–15.5)
WBC: 6.1 10*3/uL (ref 4.0–10.5)

## 2016-10-01 LAB — COMPREHENSIVE METABOLIC PANEL
ALT: 29 U/L (ref 17–63)
AST: 24 U/L (ref 15–41)
Albumin: 3.2 g/dL — ABNORMAL LOW (ref 3.5–5.0)
Alkaline Phosphatase: 79 U/L (ref 38–126)
Anion gap: 10 (ref 5–15)
BUN: 14 mg/dL (ref 6–20)
CHLORIDE: 101 mmol/L (ref 101–111)
CO2: 24 mmol/L (ref 22–32)
CREATININE: 0.84 mg/dL (ref 0.61–1.24)
Calcium: 8.7 mg/dL — ABNORMAL LOW (ref 8.9–10.3)
Glucose, Bld: 181 mg/dL — ABNORMAL HIGH (ref 65–99)
POTASSIUM: 3.7 mmol/L (ref 3.5–5.1)
Sodium: 135 mmol/L (ref 135–145)
Total Bilirubin: 0.3 mg/dL (ref 0.3–1.2)
Total Protein: 6.3 g/dL — ABNORMAL LOW (ref 6.5–8.1)

## 2016-10-01 LAB — URINALYSIS, ROUTINE W REFLEX MICROSCOPIC
Bilirubin Urine: NEGATIVE
GLUCOSE, UA: NEGATIVE mg/dL
Hgb urine dipstick: NEGATIVE
Ketones, ur: NEGATIVE mg/dL
LEUKOCYTES UA: NEGATIVE
Nitrite: NEGATIVE
PROTEIN: NEGATIVE mg/dL
Specific Gravity, Urine: 1.011 (ref 1.005–1.030)
pH: 6 (ref 5.0–8.0)

## 2016-10-01 LAB — PROTIME-INR
INR: 2.52
Prothrombin Time: 27.7 seconds — ABNORMAL HIGH (ref 11.4–15.2)

## 2016-10-01 LAB — GLUCOSE, CAPILLARY: Glucose-Capillary: 145 mg/dL — ABNORMAL HIGH (ref 65–99)

## 2016-10-01 LAB — HEMATOCRIT: HEMATOCRIT: 25.9 % — AB (ref 39.0–52.0)

## 2016-10-01 LAB — LIPASE, BLOOD: Lipase: 28 U/L (ref 11–51)

## 2016-10-01 MED ORDER — ACETAMINOPHEN ER 650 MG PO TBCR: 650.0000 mg | EXTENDED_RELEASE_TABLET | Freq: Two times a day (BID) | ORAL | Status: DC | PRN

## 2016-10-01 MED ORDER — ZOLPIDEM TARTRATE 5 MG PO TABS
5.0000 mg | ORAL_TABLET | Freq: Every evening | ORAL | Status: DC | PRN
  Administered 2016-10-01: 5 mg via ORAL
  Filled 2016-10-01 (×2): qty 1

## 2016-10-01 MED ORDER — FLECAINIDE ACETATE 50 MG PO TABS: 150.0000 mg | ORAL_TABLET | Freq: Two times a day (BID) | ORAL | Status: DC

## 2016-10-01 MED ORDER — MORPHINE SULFATE (PF) 4 MG/ML IV SOLN
1.0000 mg | INTRAVENOUS | Status: DC | PRN
  Administered 2016-10-01 – 2016-10-02 (×3): 1 mg via INTRAVENOUS
  Filled 2016-10-01 (×3): qty 1

## 2016-10-01 MED ORDER — FERROUS SULFATE 325 (65 FE) MG PO TABS
325.0000 mg | ORAL_TABLET | Freq: Every day | ORAL | Status: DC
  Administered 2016-10-02: 325 mg via ORAL
  Filled 2016-10-01: qty 1

## 2016-10-01 MED ORDER — PANTOPRAZOLE SODIUM 40 MG IV SOLR
40.0000 mg | Freq: Two times a day (BID) | INTRAVENOUS | Status: DC
  Administered 2016-10-01 – 2016-10-03 (×4): 40 mg via INTRAVENOUS
  Filled 2016-10-01 (×4): qty 40

## 2016-10-01 MED ORDER — INSULIN GLARGINE 100 UNITS/ML SOLOSTAR PEN
8.0000 [IU] | PEN_INJECTOR | Freq: Every day | SUBCUTANEOUS | Status: DC
  Filled 2016-10-01: qty 3

## 2016-10-01 MED ORDER — SODIUM CHLORIDE 0.9 % IV BOLUS (SEPSIS)
1000.0000 mL | Freq: Once | INTRAVENOUS | Status: AC
  Administered 2016-10-01: 1000 mL via INTRAVENOUS

## 2016-10-01 MED ORDER — NITROGLYCERIN 0.4 MG SL SUBL: 0.4000 mg | SUBLINGUAL_TABLET | SUBLINGUAL | Status: DC | PRN

## 2016-10-01 MED ORDER — SODIUM CHLORIDE 0.9 % IV SOLN: 250.0000 mL | INTRAVENOUS | Status: DC | PRN

## 2016-10-01 MED ORDER — INSULIN GLARGINE 100 UNIT/ML ~~LOC~~ SOLN
8.0000 [IU] | Freq: Every day | SUBCUTANEOUS | Status: DC
  Administered 2016-10-01: 8 [IU] via SUBCUTANEOUS
  Filled 2016-10-01 (×3): qty 0.08

## 2016-10-01 MED ORDER — TRAMADOL HCL 50 MG PO TABS
50.0000 mg | ORAL_TABLET | Freq: Four times a day (QID) | ORAL | Status: DC | PRN
  Administered 2016-10-01 – 2016-10-03 (×6): 50 mg via ORAL
  Filled 2016-10-01 (×6): qty 1

## 2016-10-01 MED ORDER — LORATADINE 10 MG PO TABS
10.0000 mg | ORAL_TABLET | Freq: Every evening | ORAL | Status: DC
  Administered 2016-10-02 – 2016-10-03 (×2): 10 mg via ORAL
  Filled 2016-10-01 (×3): qty 1

## 2016-10-01 MED ORDER — PRAVASTATIN SODIUM 40 MG PO TABS
40.0000 mg | ORAL_TABLET | Freq: Every evening | ORAL | Status: DC
  Administered 2016-10-02 – 2016-10-03 (×2): 40 mg via ORAL
  Filled 2016-10-01 (×3): qty 1

## 2016-10-01 MED ORDER — SODIUM CHLORIDE 0.9% FLUSH: 3.0000 mL | INTRAVENOUS | Status: DC | PRN

## 2016-10-01 MED ORDER — SODIUM CHLORIDE 0.9% FLUSH
3.0000 mL | Freq: Two times a day (BID) | INTRAVENOUS | Status: DC
  Administered 2016-10-01 – 2016-10-03 (×4): 3 mL via INTRAVENOUS

## 2016-10-01 MED ORDER — FLECAINIDE ACETATE 50 MG PO TABS
225.0000 mg | ORAL_TABLET | Freq: Two times a day (BID) | ORAL | Status: DC
  Administered 2016-10-01 – 2016-10-03 (×4): 225 mg via ORAL
  Filled 2016-10-01 (×5): qty 1

## 2016-10-01 MED ORDER — CYCLOBENZAPRINE HCL 10 MG PO TABS
10.0000 mg | ORAL_TABLET | Freq: Three times a day (TID) | ORAL | Status: DC | PRN
  Administered 2016-10-01 – 2016-10-03 (×6): 10 mg via ORAL
  Filled 2016-10-01 (×6): qty 1

## 2016-10-01 MED ORDER — IOPAMIDOL (ISOVUE-300) INJECTION 61%
INTRAVENOUS | Status: AC
  Administered 2016-10-01: 100 mL via INTRAVENOUS
  Filled 2016-10-01: qty 100

## 2016-10-01 MED ORDER — ADULT MULTIVITAMIN W/MINERALS CH
1.0000 | ORAL_TABLET | Freq: Every day | ORAL | Status: DC
  Administered 2016-10-02 – 2016-10-03 (×2): 1 via ORAL
  Filled 2016-10-01 (×3): qty 1

## 2016-10-01 MED ORDER — DILTIAZEM HCL ER COATED BEADS 120 MG PO CP24
120.0000 mg | ORAL_CAPSULE | Freq: Every day | ORAL | Status: DC
  Administered 2016-10-01 – 2016-10-03 (×3): 120 mg via ORAL
  Filled 2016-10-01 (×3): qty 1

## 2016-10-01 MED ORDER — INSULIN ASPART 100 UNIT/ML ~~LOC~~ SOLN
0.0000 [IU] | Freq: Three times a day (TID) | SUBCUTANEOUS | Status: DC
  Administered 2016-10-02: 2 [IU] via SUBCUTANEOUS
  Administered 2016-10-03: 1 [IU] via SUBCUTANEOUS
  Administered 2016-10-03: 2 [IU] via SUBCUTANEOUS

## 2016-10-01 MED ORDER — ACETAMINOPHEN 325 MG PO TABS
650.0000 mg | ORAL_TABLET | Freq: Four times a day (QID) | ORAL | Status: DC | PRN
  Administered 2016-10-01 – 2016-10-03 (×4): 650 mg via ORAL
  Filled 2016-10-01 (×4): qty 2

## 2016-10-01 MED ORDER — POLYETHYLENE GLYCOL 3350 17 G PO PACK
17.0000 g | PACK | Freq: Every day | ORAL | Status: DC
  Administered 2016-10-02 – 2016-10-03 (×2): 17 g via ORAL
  Filled 2016-10-01 (×2): qty 1

## 2016-10-01 MED ORDER — ACETAMINOPHEN 650 MG RE SUPP: 650.0000 mg | Freq: Four times a day (QID) | RECTAL | Status: DC | PRN

## 2016-10-01 NOTE — ED Triage Notes (Signed)
Received pt from home via EMS with c/o left sided abdominal pain ongoing for 1 year. Pt reports bright red blood noted in stool today.

## 2016-10-01 NOTE — ED Provider Notes (Signed)
[facility] DEPT Provider Note   CSN: [id number] Arrival date & time: 10/01/16  1352     History   Chief Complaint Chief Complaint  Patient presents with  . Abdominal Pain  . Blood In Stools    HPI Archer [name] is a 76 y.o. male.  The history is provided by the patient.  Illness  This is a chronic problem. The current episode started more than 1 week ago. The problem occurs constantly. The problem has been gradually worsening. Associated symptoms include abdominal pain. Pertinent negatives include no chest pain, no headaches and no shortness of breath. Associated symptoms comments: Blood-streaked stools. Exacerbated by: Palpation. Nothing relieves the symptoms. He has tried nothing for the symptoms.    Past Medical History:  Diagnosis Date  . Anemia    takes Ferrous Sulfate daily  . Arthritis    "all over"  . Atrial flutter ([place])    a. 07/2010 Status post caval tricuspid isthmus ablation by Dr. Midge Aver Metoprolol daily  . Balance problem 01/2014  . CAP (community acquired pneumonia) 09/18/2014  . Cervical radiculopathy due to degenerative joint disease of spine   . COPD (chronic obstructive pulmonary disease) ([place])   . Coronary artery disease, non-occlusive    a. 03/2010 Nonocclusive disease by cath, performed for ST elevations on ECG;  b. 06/2013 Lexi MV: EF 60%, no ischemia.  . Diabetes mellitus type II    takes Metformin and Lantus daily  . Diastolic CHF, chronic ([place])    a. 12/2012 EF 55-60%, diast dysfxn, triv MR, mildly dil LA/RA.  [name] Dysrhythmia    HX OF ATRIAL IFB /FLUTTER takes Flecanide and Coumadin daily  . History of blood transfusion 1982   "when I had stomach OR"  . History of bronchitis    1998  . History of gastric ulcer   . HTN (hypertension)    takes Diltiazem daily  . Hyperlipidemia    takes Pravastatin daily  . Joint pain   . PAF (paroxysmal atrial fibrillation) ([place])    a. Recurrent after atrial flutter, currently controlled on  flecainide plus diltiazem  . Pneumonia 1999  . Weakness    numbness and tingling both hands    Patient Active Problem List   Diagnosis Date Noted  . Left lower quadrant pain   . Coagulopathy ([place])   . Heme positive stool   . Symptomatic anemia 07/10/2016  . Syncope 05/15/2015  . Cervical disc disease 05/15/2015  . Abnormal urinalysis 05/15/2015  . Anemia 05/15/2015  . History of CVA (cerebrovascular accident) 04/03/2014  . Near syncope 02/15/2014  . Pre-syncope 02/15/2014  . Benign neoplasm of rectum and anal canal 12/02/2013  . Lower gastrointestinal bleeding 11/30/2013  . Tobacco use disorder 11/30/2013  . PAF (paroxysmal atrial fibrillation), maintaining SR; CHA2DS2Vasc = 7 08/15/2013  . Anticoagulation goal of INR 2 to 3, for PAF - CHA2DS2Vasc = 7 08/15/2013  . Type 2 diabetes mellitus ([place]) 01/22/2013  . Tobacco abuse counseling 12/15/2012  . Hyperlipidemia   . Obesity (BMI 30-39.9) 12/14/2012  . Atrial flutter - status post CTI ablation 07/29/2010  . Essential hypertension 07/29/2010  . Chronic diastolic congestive heart failure ([place]) 07/29/2010  . Coronary artery disease, non-occlusive 07/29/2010    Past Surgical History:  Procedure Laterality Date  . ATRIAL ABLATION SURGERY  08/05/10   CTI ablation for atrial flutter by JA  . CARDIAC CATHETERIZATION  2012   nl LV function, no occlusive CAD, PAF  . CARDIOVERSION  12/07/2010    Successful  direct current cardioversion with atrial fibrillation to normal sinus rhythm  . CARPAL TUNNEL RELEASE Bilateral 01/30/2014   Procedure: BILATERAL CARPAL TUNNEL RELEASE;  Surgeon: Marianna Payment, MD;  Location: [facility];  Service: Orthopedics;  Laterality: Bilateral;  . CATARACT EXTRACTION W/ INTRAOCULAR LENS  IMPLANT, BILATERAL Bilateral   . COLONOSCOPY [name] 12/02/2013   Procedure: COLONOSCOPY;  Surgeon: Irene Shipper, MD;  Location: [facility];  Service: Endoscopy;  Laterality: [name];  . ESOPHAGOGASTRODUODENOSCOPY [name] 09/22/2014    Procedure: ESOPHAGOGASTRODUODENOSCOPY (EGD);  Surgeon: Ronald Lobo, MD;  Location[facility] ENDOSCOPY;  Service: Endoscopy;  Laterality: [name];  . ESOPHAGOGASTRODUODENOSCOPY [name] 07/11/2016   Procedure: ESOPHAGOGASTRODUODENOSCOPY (EGD);  Surgeon: Doran Stabler, MD;  Location: [facility] ENDOSCOPY;  Service: Endoscopy;  Laterality: [name];  . INCISION AND DRAINAGE ABSCESS / HEMATOMA OF BURSA / KNEE / THIGH Left 1998   knee  . KNEE BURSECTOMY Left 1998  . LAPAROSCOPIC CHOLECYSTECTOMY  03/2010  . NM MYOVIEW LTD  07/22/2013   Normal EF ~60%, no ischemia or infarction.  [name] PARTIAL GASTRECTOMY  1982   subtotal; "took out 30% for ulcers"  . TRANSTHORACIC ECHOCARDIOGRAM  02/16/2014   EF 60%, no RWMA. - otherwise normal  . YAG LASER APPLICATION Bilateral        Home Medications    Prior to Admission medications   Medication Sig Start Date End Date Taking? Authorizing Provider  acetaminophen (TYLENOL) 650 MG CR tablet Take 650 mg by mouth 2 (two) times daily as needed for pain.    [provider]  clindamycin (CLEOCIN) 300 MG capsule Take 1 capsule (300 mg total) by mouth 4 (four) times daily. X 7 days 09/23/16   Ripley Fraise, MD  cyclobenzaprine (FLEXERIL) 5 MG tablet Take 2 tablets (10 mg total) by mouth 3 (three) times daily as needed for muscle spasms. 09/29/16   Charlann Lange, PA-C  diclofenac sodium (VOLTAREN) 1 % GEL Apply 2 g topically 4 (four) times daily. Patient not taking: Reported on 09/23/2016 07/12/16   Holley Raring, MD  diltiazem (CARDIZEM CD) 120 MG 24 hr capsule Take 1 capsule (120 mg total) by mouth daily. 03/19/15   Erlene Quan, PA-C  ferrous sulfate 325 (65 FE) MG tablet Take 325 mg by mouth daily with breakfast.    [provider]  flecainide (TAMBOCOR) 150 MG tablet TAKE 1 AND 1/2 TABLETS TWICE DAILY 08/14/16   Leonie Man, MD  Insulin Glargine (LANTUS) 100 UNIT/ML Solostar Pen Inject 8 Units into the skin at bedtime. Patient taking differently: Inject 8 Units  into the skin 2 (two) times daily.  07/12/16   Holley Raring, MD  loratadine (CLARITIN) 10 MG tablet Take 10 mg by mouth every evening.     [provider]  metFORMIN (GLUCOPHAGE) 1000 MG tablet Take 1,000 mg by mouth 2 (two) times daily with a meal.     [provider]  metoprolol succinate (TOPROL-XL) 25 MG 24 hr tablet TAKE 1/2 TABLET DAILY. (NEED MD APPT FOR FURTHER REFILLS) Patient taking differently: TAKE 1/2 TABLET (12.5 MG) DAILY. (NEED MD APPT FOR FURTHER REFILLS) 06/13/16   Leonie Man, MD  Multiple Vitamin (MULTIVITAMIN WITH MINERALS) TABS tablet Take 1 tablet by mouth 2 (two) times daily.     [provider]  nitroGLYCERIN (NITROSTAT) 0.4 MG SL tablet Place 1 tablet (0.4 mg total) under the tongue every 5 (five) minutes x 3 doses as needed for chest pain. 11/19/15   Erlene Quan, PA-C  pantoprazole (PROTONIX)  40 MG tablet Take 1 tablet (40 mg total) by mouth 2 (two) times daily. Patient not taking: Reported on 09/23/2016 07/12/16   Holley Raring, MD  pravastatin (PRAVACHOL) 40 MG tablet TAKE 1 TABLET EVERY EVENING 09/22/16   Leonie Man, MD  traMADol (ULTRAM) 50 MG tablet Take 1 tablet (50 mg total) by mouth every 6 (six) hours as needed. 09/29/16   Charlann Lange, PA-C  warfarin (COUMADIN) 5 MG tablet TAKE 1 TO 1 AND 1/2 TABLETS DAILY AS DIRECTED Patient not taking: Reported on 09/23/2016 08/29/16   Troy Sine, MD  warfarin (COUMADIN) 5 MG tablet Take 5-7.5 mg by mouth See admin instructions. Take 1 tablet on Monday, Wednesday and Friday then take 1 and 1/2 tablets all the other days    [provider]    Family History Family History  Problem Relation Age of Onset  . Cancer Mother   . Heart attack Father   . Heart attack Brother     Social History Social History  Substance Use Topics  . Smoking status: Current Every Day Smoker    Packs/day: 0.50    Years: 55.00    Types: Cigarettes  . Smokeless tobacco: Never Used     Comment:  He's been smoking between 0.5 and 2 ppd since age 55.  [name] Alcohol use No     Comment: "quit drinking in 1986"     Allergies   Patient has no known allergies.   Review of Systems Review of Systems  Constitutional: Negative for chills and fever.  HENT: Negative for ear pain and sore throat.   Eyes: Negative for pain and visual disturbance.  Respiratory: Negative for cough and shortness of breath.   Cardiovascular: Negative for chest pain and palpitations.  Gastrointestinal: Positive for abdominal distention, abdominal pain and blood in stool. Negative for constipation, diarrhea, nausea and vomiting.  Endocrine: Negative for polyuria.  Genitourinary: Negative for dysuria and hematuria.  Musculoskeletal: Negative for arthralgias and back pain.  Skin: Negative for color change and rash.  Allergic/Immunologic: Negative for immunocompromised state.  Neurological: Negative for seizures, syncope and headaches.  Psychiatric/Behavioral: Negative for agitation and behavioral problems.  All other systems reviewed and are negative.    Physical Exam Updated Vital Signs BP 101/60 (BP Location: Left Arm)   Pulse 85   Temp 98.2 F (36.8 C) (Oral)   Resp 19   Ht 5\' 6"  (1.676 m)   Wt 78.5 kg (173 lb)   SpO2 96%   BMI 27.92 kg/m   Physical Exam  Constitutional: He is oriented to person, place, and time. He appears well-developed and well-nourished. No distress.  HENT:  Head: Normocephalic and atraumatic.  Eyes: Conjunctivae and EOM are normal. Pupils are equal, round, and reactive to light.  Neck: No tracheal deviation present.  Cardiovascular: Normal rate and intact distal pulses.   Pulmonary/Chest: Effort normal and breath sounds normal. No respiratory distress. He has no wheezes.  Abdominal: Soft. Bowel sounds are normal. He exhibits distension. There is tenderness.  Mild distention noted to the left flank with some tenderness to palpation in that area. Also with left lower quadrant  abdominal tenderness  Genitourinary: Rectal exam shows guaiac positive stool.  Genitourinary Comments: Small external hemorrhoid noted, no fissures, FOBT positive.  Musculoskeletal: He exhibits no tenderness or deformity.  Neurological: He is alert and oriented to person, place, and time.  Skin: Skin is warm and dry. He is not diaphoretic.  Psychiatric: He has a normal mood and  affect.     ED Treatments / Results  Labs (all labs ordered are listed, but only abnormal results are displayed) Labs Reviewed  COMPREHENSIVE METABOLIC PANEL - Abnormal; Notable for the following:       Result Value   Glucose, Bld 181 (*)    Calcium 8.7 (*)    Total Protein 6.3 (*)    Albumin 3.2 (*)    All other components within normal limits  CBC - Abnormal; Notable for the following:    RBC 3.38 (*)    Hemoglobin 8.1 (*)    HCT 27.2 (*)    MCH 24.0 (*)    MCHC 29.8 (*)    RDW 17.3 (*)    All other components within normal limits  URINALYSIS, ROUTINE W REFLEX MICROSCOPIC - Abnormal; Notable for the following:    Color, Urine STRAW (*)    All other components within normal limits  PROTIME-INR - Abnormal; Notable for the following:    Prothrombin Time 27.7 (*)    All other components within normal limits  HEMATOCRIT - Abnormal; Notable for the following:    HCT 25.9 (*)    All other components within normal limits  GLUCOSE, CAPILLARY - Abnormal; Notable for the following:    Glucose-Capillary 145 (*)    All other components within normal limits  LIPASE, BLOOD  CBC    EKG  EKG Interpretation None       Radiology Ct Abdomen Pelvis W Contrast  Result Date: 10/01/2016 CLINICAL DATA:  Intermittent bloody stools over the past year. Left lower quadrant pain. Decreased hemoglobin. Recent left buttock abscess drainage. Partial gastrectomy secondary to chronic blood loss. Rule out diverticulitis or diverticulosis. EXAM: CT ABDOMEN AND PELVIS WITH CONTRAST TECHNIQUE: Multidetector CT imaging of the  abdomen and pelvis was performed using the standard protocol following bolus administration of intravenous contrast. CONTRAST:  158mL ISOVUE-300 IOPAMIDOL (ISOVUE-300) INJECTION 61% COMPARISON:  07/10/2016 FINDINGS: Mild to moderate motion degradation throughout. Lower chest: Clear lung bases. Mild cardiomegaly with right coronary artery atherosclerosis. Surgical changes at the gastroesophageal junction. Hepatobiliary: Normal liver. Cholecystectomy, without biliary ductal dilatation. Pancreas: Mild to moderate pancreatic atrophy, without duct dilatation or acute inflammation. Spleen: Normal in size, without focal abnormality. Adrenals/Urinary Tract: Normal adrenal glands. Lower pole right renal collecting system stone or stones on the order of 4 mm. Normal left kidney, without hydronephrosis. Normal urinary bladder. Stomach/Bowel: Status post partial gastrectomy and gastrojejunostomy. Colonic stool burden suggests constipation. Scattered colonic diverticula. No evidence of diverticulitis. Normal terminal ileum. Appendix grossly normal but suboptimally assessed evaluated secondary to motion. Normal small bowel. Vascular/Lymphatic: Aortic and branch vessel atherosclerosis. No abdominopelvic adenopathy. Reproductive: Mild prostatomegaly. Other: Subcutaneous thickening about the left gluteal crease, including on image 90/ series 3. Incompletely imaged. Musculoskeletal: Disc bulges at L3-4 and L2-3. IMPRESSION: 1. Mild-to-moderate motion degraded exam. 2.  Possible constipation. 3. Scattered diverticulosis without evidence of diverticulitis. 4. Right nephrolithiasis. 5. Coronary artery atherosclerosis. Aortic Atherosclerosis (ICD10-I70.0). 6. Nonspecific soft tissue thickening about the left gluteal crease. Likely the site of reported abscess drainage. This is incompletely imaged and should be correlated with physical exam. Electronically Signed   By: Abigail Miyamoto M.D.   On: 10/01/2016 17:29    Procedures Procedures  (including critical care time)  Medications Ordered in ED Medications  cyclobenzaprine (FLEXERIL) tablet 10 mg (10 mg Oral Given 10/01/16 2137)  traMADol (ULTRAM) tablet 50 mg (50 mg Oral Given 10/01/16 2137)  pravastatin (PRAVACHOL) tablet 40 mg (not administered)  nitroGLYCERIN (NITROSTAT)  SL tablet 0.4 mg (not administered)  diltiazem (CARDIZEM CD) 24 hr capsule 120 mg (120 mg Oral Given 10/01/16 2050)  multivitamin with minerals tablet 1 tablet (not administered)  ferrous sulfate tablet 325 mg (not administered)  loratadine (CLARITIN) tablet 10 mg (not administered)  sodium chloride flush (NS) 0.9 % injection 3 mL (3 mLs Intravenous Given 10/01/16 2251)  sodium chloride flush (NS) 0.9 % injection 3 mL (not administered)  0.9 %  sodium chloride infusion (not administered)  acetaminophen (TYLENOL) tablet 650 mg (650 mg Oral Given 10/01/16 2104)    Or  acetaminophen (TYLENOL) suppository 650 mg ( Rectal See Alternative 10/01/16 2104)  zolpidem (AMBIEN) tablet 5 mg (5 mg Oral Given 10/01/16 2104)  pantoprazole (PROTONIX) injection 40 mg (40 mg Intravenous Given 10/01/16 2104)  insulin aspart (novoLOG) injection 0-9 Units (not administered)  flecainide (TAMBOCOR) tablet 225 mg (225 mg Oral Given 10/01/16 2250)  insulin glargine (LANTUS) injection 8 Units (8 Units Subcutaneous Given 10/01/16 2259)  sodium chloride 0.9 % bolus 1,000 mL (0 mLs Intravenous Stopped 10/01/16 1652)  iopamidol (ISOVUE-300) 61 % injection (100 mLs Intravenous Contrast Given 10/01/16 1652)     Initial Impression / Assessment and Plan / ED Course  I have reviewed the triage vital signs and the nursing notes.  Pertinent labs & imaging results that were available during my care of the patient were reviewed by me and considered in my medical decision making (see chart for details).     Iley Breeden is a 76 year old male coming in today with abdominal pain and bloody stools. Patient states his abdominal pain for the last year that has  been waxing and waning. However recently he states he has had blood-streaked stools spelled more fatigued and weak over the last day. No nausea, vomiting. Also denies diarrhea or constipation. Patient was recently seen for abscess, left gluteal region and he states the bleeding is not from that. Of note, patient also has history of colon surgery with reanastomoses.  On exam there is marked left abdominal tenderness with some distention. Fecal occult blood test was positive with no signs of fissures or large hemorrhoids. Rest of exam as above. Hemoglobin low at 8.1. Comprehensive metabolic panel unremarkable and urinalysis negative for nitrite/blood. CT abdomen and pelvis ordered and shows no acute abnormalities that would explain his bleeding. However considering low hemoglobin and recent fatigue, he'll be admitted to the hospitalist service for further evaluation and management of symptomatic anemia. Transferred in stable condition and stable while under my care in the emergency department.  Patient was seen with my attending, Dr. Jeneen Rinks, who voiced agreement and oversaw the evaluation and treatment of this patient.   [employer] seismologist was used in the creation of this note. If there are any errors or inconsistencies needing clarification, please contact me directly.   Final Clinical Impressions(s) / ED Diagnoses   Final diagnoses:  None    New Prescriptions Current Discharge Medication List       Valda Lamb, MD 10/01/16 2325    Tanna Furry, MD 10/04/16 1531

## 2016-10-01 NOTE — Progress Notes (Signed)
Pt new admit from ED alert and oriented, room air, no s/s of distress noted at this time, Dx anemia.

## 2016-10-01 NOTE — H&P (Signed)
[facility]                                                                                    Patient Demographics  [name], is a 76 y.o. male  CSN: [id number]  MRN: [id number]  DOB - 03/10/1941  Admit Date - 10/01/2016  Outpatient Primary MD for the patient is Bernerd Limbo, MD   With History of -  Past Medical History:  Diagnosis Date  . Anemia    takes Ferrous Sulfate daily  . Arthritis    "all over"  . Atrial flutter ([place])    a. 07/2010 Status post caval tricuspid isthmus ablation by Dr. Midge Aver Metoprolol daily  . Balance problem 01/2014  . CAP (community acquired pneumonia) 09/18/2014  . Cervical radiculopathy due to degenerative joint disease of spine   . COPD (chronic obstructive pulmonary disease) ([place])   . Coronary artery disease, non-occlusive    a. 03/2010 Nonocclusive disease by cath, performed for ST elevations on ECG;  b. 06/2013 Lexi MV: EF 60%, no ischemia.  . Diabetes mellitus type II    takes Metformin and Lantus daily  . Diastolic CHF, chronic ([place])    a. 12/2012 EF 55-60%, diast dysfxn, triv MR, mildly dil LA/RA.  [name] Dysrhythmia    HX OF ATRIAL IFB /FLUTTER takes Flecanide and Coumadin daily  . History of blood transfusion 1982   "when I had stomach OR"  . History of bronchitis    1998  . History of gastric ulcer   . HTN (hypertension)    takes Diltiazem daily  . Hyperlipidemia    takes Pravastatin daily  . Joint pain   . PAF (paroxysmal atrial fibrillation) ([place])    a. Recurrent after atrial flutter, currently controlled on flecainide plus diltiazem  . Pneumonia 1999  . Weakness    numbness and tingling both hands      Past Surgical History:  Procedure Laterality Date  . ATRIAL ABLATION SURGERY  08/05/10   CTI ablation for atrial flutter by JA  . CARDIAC CATHETERIZATION  2012   nl LV function, no occlusive CAD, PAF  . CARDIOVERSION  12/07/2010    Successful direct current cardioversion with atrial fibrillation to  normal sinus rhythm  . CARPAL TUNNEL RELEASE Bilateral 01/30/2014   Procedure: BILATERAL CARPAL TUNNEL RELEASE;  Surgeon: Marianna Payment, MD;  Location: [place];  Service: Orthopedics;  Laterality: Bilateral;  . CATARACT EXTRACTION W/ INTRAOCULAR LENS  IMPLANT, BILATERAL Bilateral   . COLONOSCOPY [name] 12/02/2013   Procedure: COLONOSCOPY;  Surgeon: Irene Shipper, MD;  Location: [place];  Service: Endoscopy;  Laterality: [name];  . ESOPHAGOGASTRODUODENOSCOPY [name] 09/22/2014   Procedure: ESOPHAGOGASTRODUODENOSCOPY (EGD);  Surgeon: Ronald Lobo, MD;  Location: [facility] ENDOSCOPY;  Service: Endoscopy;  Laterality: [name];  . ESOPHAGOGASTRODUODENOSCOPY [name] 07/11/2016   Procedure: ESOPHAGOGASTRODUODENOSCOPY (EGD);  Surgeon: Doran Stabler, MD;  Location: [facility] ENDOSCOPY;  Service: Endoscopy;  Laterality: [name];  . INCISION AND DRAINAGE ABSCESS / HEMATOMA OF BURSA / KNEE / THIGH Left 1998   knee  . KNEE BURSECTOMY Left 1998  . LAPAROSCOPIC CHOLECYSTECTOMY  03/2010  . NM MYOVIEW LTD  07/22/2013   Normal  EF ~60%, no ischemia or infarction.  [name] PARTIAL GASTRECTOMY  1982   subtotal; "took out 30% for ulcers"  . TRANSTHORACIC ECHOCARDIOGRAM  02/16/2014   EF 60%, no RWMA. - otherwise normal  . YAG LASER APPLICATION Bilateral     in for   Chief Complaint  Patient presents with  . Abdominal Pain  . Blood In Stools     HPI  [name]  is a 76 y.o. male,With past medical history significant for at A. fib flutter on flecainide and Coumadin and history of gastric ulcer status post gastrectomy in the past presenting with 9 months history of abdominal pain left lower quadrant with anemia. Patient came to the emergency room around 3 days ago with the same complaint and his hemoglobin was 8.7. His hemoglobin now is 8.1 and his stool guaiac was positive. No history of nausea vomiting or diarrhea but he took clindamycin for right buttocks abscess in the past. The pain has got worse in the last few days according to the  patient who is rolling in bed due to  pain. No history of fever or chills. CT scan in the emergency room showed possible constipation with diverticulosis without evidence of diverticulitis  Review of Systems    In addition to the HPI above,  No Fever-chills, No Headache, No changes with Vision or hearing, No problems swallowing food or Liquids, No Chest pain, Cough or Shortness of Breath, No Nausea or Vommitting, Bowel movements are regular,, No dysuria, No new skin rashes or bruises, No new joints pains-aches,  No new weakness, tingling, numbness in any extremity, No recent weight gain or loss, No polyuria, polydypsia or polyphagia, No significant Mental Stressors.  A full 10 point Review of Systems was done, except as stated above, all other Review of Systems were negative.   Social History Social History  Substance Use Topics  . Smoking status: Current Every Day Smoker    Packs/day: 0.50    Years: 55.00    Types: Cigarettes  . Smokeless tobacco: Never Used     Comment: He's been smoking between 0.5 and 2 ppd since age 27.  [name] Alcohol use No     Comment: "quit drinking in 1986"    Family History Family History  Problem Relation Age of Onset  . Cancer Mother   . Heart attack Father   . Heart attack Brother      Prior to Admission medications   Medication Sig Start Date End Date Taking? Authorizing Provider  acetaminophen (TYLENOL) 650 MG CR tablet Take 650 mg by mouth 2 (two) times daily as needed for pain.    [provider]  clindamycin (CLEOCIN) 300 MG capsule Take 1 capsule (300 mg total) by mouth 4 (four) times daily. X 7 days 09/23/16   Ripley Fraise, MD  cyclobenzaprine (FLEXERIL) 5 MG tablet Take 2 tablets (10 mg total) by mouth 3 (three) times daily as needed for muscle spasms. 09/29/16   Charlann Lange, PA-C  diclofenac sodium (VOLTAREN) 1 % GEL Apply 2 g topically 4 (four) times daily. Patient not taking: Reported on 09/23/2016 07/12/16   Holley Raring, MD  diltiazem (CARDIZEM CD) 120 MG 24 hr capsule Take 1 capsule (120 mg total) by mouth daily. 03/19/15   Erlene Quan, PA-C  ferrous sulfate 325 (65 FE) MG tablet Take 325 mg by mouth daily with breakfast.    [provider]  flecainide (TAMBOCOR) 150 MG tablet TAKE 1 AND 1/2 TABLETS TWICE DAILY 08/14/16  Leonie Man, MD  Insulin Glargine (LANTUS) 100 UNIT/ML Solostar Pen Inject 8 Units into the skin at bedtime. Patient taking differently: Inject 8 Units into the skin 2 (two) times daily.  07/12/16   Holley Raring, MD  loratadine (CLARITIN) 10 MG tablet Take 10 mg by mouth every evening.     [provider]  metFORMIN (GLUCOPHAGE) 1000 MG tablet Take 1,000 mg by mouth 2 (two) times daily with a meal.     [provider]  metoprolol succinate (TOPROL-XL) 25 MG 24 hr tablet TAKE 1/2 TABLET DAILY. (NEED MD APPT FOR FURTHER REFILLS) Patient taking differently: TAKE 1/2 TABLET (12.5 MG) DAILY. (NEED MD APPT FOR FURTHER REFILLS) 06/13/16   Leonie Man, MD  Multiple Vitamin (MULTIVITAMIN WITH MINERALS) TABS tablet Take 1 tablet by mouth 2 (two) times daily.     [provider]  nitroGLYCERIN (NITROSTAT) 0.4 MG SL tablet Place 1 tablet (0.4 mg total) under the tongue every 5 (five) minutes x 3 doses as needed for chest pain. 11/19/15   Erlene Quan, PA-C  pantoprazole (PROTONIX) 40 MG tablet Take 1 tablet (40 mg total) by mouth 2 (two) times daily. Patient not taking: Reported on 09/23/2016 07/12/16   Holley Raring, MD  pravastatin (PRAVACHOL) 40 MG tablet TAKE 1 TABLET EVERY EVENING 09/22/16   Leonie Man, MD  traMADol (ULTRAM) 50 MG tablet Take 1 tablet (50 mg total) by mouth every 6 (six) hours as needed. 09/29/16   Charlann Lange, PA-C  warfarin (COUMADIN) 5 MG tablet TAKE 1 TO 1 AND 1/2 TABLETS DAILY AS DIRECTED Patient not taking: Reported on 09/23/2016 08/29/16   Troy Sine, MD  warfarin (COUMADIN) 5 MG tablet Take 5-7.5 mg by mouth See  admin instructions. Take 1 tablet on Monday, Wednesday and Friday then take 1 and 1/2 tablets all the other days    [provider]    No Known Allergies  Physical Exam  Vitals  Blood pressure 125/74, pulse 83, temperature 98.9 F (37.2 C), temperature source Oral, resp. rate 20, height 5\' 6"  (1.676 m), weight 78.5 kg (173 lb), SpO2 96 %.   1. General Elderly male in pain  2. Normal affect and insight, Not Suicidal or Homicidal, Awake Alert, Oriented X 3.  3. No F.N deficits, grossly patient moving all extremities,   4. Ears and Eyes appear Normal, Conjunctivae clear, PERRLA. Moist Oral Mucosa.  5. Supple Neck, No JVD, No cervical lymphadenopathy appriciated, No Carotid Bruits.  6. Symmetrical Chest wall movement, Good air movement bilaterally, CTAB.  7. RRR, No Gallops, Rubs or Murmurs, No Parasternal Heave.  8. Positive Bowel Sounds, Abdomen Soft, left lower quadrant tenderness  noted, well-healed surgical wound No organomegaly appriciated,  9.  No Cyanosis, Normal Skin Turgor, No Skin Rash or Bruise.  10. Good muscle tone,  joints appear normal , no effusions, Normal ROM.    Data Review  CBC  Recent Labs Lab 09/28/16 2352 10/01/16 1402  WBC 10.8* 6.1  HGB 8.7* 8.1*  HCT 29.0* 27.2*  PLT 361 347  MCV 82.2 80.5  MCH 24.6* 24.0*  MCHC 30.0 29.8*  RDW 17.5* 17.3*  LYMPHSABS 0.9  --   MONOABS 0.9  --   EOSABS 0.0  --   BASOSABS 0.0  --    ------------------------------------------------------------------------------------------------------------------  Chemistries   Recent Labs Lab 09/28/16 2352 10/01/16 1402  NA 131* 135  K 3.9 3.7  CL 98* 101  CO2 23 24  GLUCOSE 140*  181*  BUN 15 14  CREATININE 0.86 0.84  CALCIUM 9.6 8.7*  AST 25 24  ALT 26 29  ALKPHOS 78 79  BILITOT 0.3 0.3   ------------------------------------------------------------------------------------------------------------------ estimated creatinine clearance is 72.6  mL/min (by C-G formula based on SCr of 0.84 mg/dL). ------------------------------------------------------------------------------------------------------------------ No results for input(s): TSH, T4TOTAL, T3FREE, THYROIDAB in the last 72 hours.  Invalid input(s): FREET3   Coagulation profile  Recent Labs Lab 09/28/16 2352  INR 1.76   ------------------------------------------------------------------------------------------------------------------- No results for input(s): DDIMER in the last 72 hours. -------------------------------------------------------------------------------------------------------------------  Cardiac Enzymes  Recent Labs Lab 09/28/16 2352  TROPONINI <0.03   ------------------------------------------------------------------------------------------------------------------ Invalid input(s): POCBNP   ---------------------------------------------------------------------------------------------------------------  Urinalysis    Component Value Date/Time   COLORURINE STRAW (A) 10/01/2016 1354   APPEARANCEUR CLEAR 10/01/2016 1354   LABSPEC 1.011 10/01/2016 1354   PHURINE 6.0 10/01/2016 1354   GLUCOSEU NEGATIVE 10/01/2016 1354   HGBUR NEGATIVE 10/01/2016 1354   BILIRUBINUR NEGATIVE 10/01/2016 1354   KETONESUR NEGATIVE 10/01/2016 1354   PROTEINUR NEGATIVE 10/01/2016 1354   UROBILINOGEN 0.2 10/09/2014 2315   NITRITE NEGATIVE 10/01/2016 1354   LEUKOCYTESUR NEGATIVE 10/01/2016 1354    ----------------------------------------------------------------------------------------------------------------   Imaging results:   Dg Chest 2 View  Result Date: 09/28/2016 CLINICAL DATA:  Back pain EXAM: CHEST  2 VIEW COMPARISON:  Chest radiograph 07/10/2016 FINDINGS: Cardiac silhouette is unchanged. There are also unchanged prominent interstitial opacities. No focal consolidation or pulmonary edema. No pleural effusion or pneumothorax. IMPRESSION: No active  cardiopulmonary disease. Electronically Signed   By: Ulyses Jarred M.D.   On: 09/28/2016 23:58   Ct Abdomen Pelvis W Contrast  Result Date: 10/01/2016 CLINICAL DATA:  Intermittent bloody stools over the past year. Left lower quadrant pain. Decreased hemoglobin. Recent left buttock abscess drainage. Partial gastrectomy secondary to chronic blood loss. Rule out diverticulitis or diverticulosis. EXAM: CT ABDOMEN AND PELVIS WITH CONTRAST TECHNIQUE: Multidetector CT imaging of the abdomen and pelvis was performed using the standard protocol following bolus administration of intravenous contrast. CONTRAST:  129mL ISOVUE-300 IOPAMIDOL (ISOVUE-300) INJECTION 61% COMPARISON:  07/10/2016 FINDINGS: Mild to moderate motion degradation throughout. Lower chest: Clear lung bases. Mild cardiomegaly with right coronary artery atherosclerosis. Surgical changes at the gastroesophageal junction. Hepatobiliary: Normal liver. Cholecystectomy, without biliary ductal dilatation. Pancreas: Mild to moderate pancreatic atrophy, without duct dilatation or acute inflammation. Spleen: Normal in size, without focal abnormality. Adrenals/Urinary Tract: Normal adrenal glands. Lower pole right renal collecting system stone or stones on the order of 4 mm. Normal left kidney, without hydronephrosis. Normal urinary bladder. Stomach/Bowel: Status post partial gastrectomy and gastrojejunostomy. Colonic stool burden suggests constipation. Scattered colonic diverticula. No evidence of diverticulitis. Normal terminal ileum. Appendix grossly normal but suboptimally assessed evaluated secondary to motion. Normal small bowel. Vascular/Lymphatic: Aortic and branch vessel atherosclerosis. No abdominopelvic adenopathy. Reproductive: Mild prostatomegaly. Other: Subcutaneous thickening about the left gluteal crease, including on image 90/ series 3. Incompletely imaged. Musculoskeletal: Disc bulges at L3-4 and L2-3. IMPRESSION: 1. Mild-to-moderate motion degraded  exam. 2.  Possible constipation. 3. Scattered diverticulosis without evidence of diverticulitis. 4. Right nephrolithiasis. 5. Coronary artery atherosclerosis. Aortic Atherosclerosis (ICD10-I70.0). 6. Nonspecific soft tissue thickening about the left gluteal crease. Likely the site of reported abscess drainage. This is incompletely imaged and should be correlated with physical exam. Electronically Signed   By: Abigail Miyamoto M.D.   On: 10/01/2016 17:29      Assessment & Plan  1. Abdominal pain; CT of abdomen negative. Probably an element of constipation worsening his pain 2. Anemia  patient is on Coumadin and Voltaren gel. Patient has a history of gastric ulcer status post gastrectomy. His INR is still pending 3. Arterial fib/flutter on Coumadin and flecainide, INR is pending 4. History of diabetes mellitus  Plan Follow hemoglobin hematocrit every 8 hours MiraLAX daily Pain control IV Protonix Hold his Glucophage the day Clear liquid diet Nothing by mouth after midnight Consider GI consult in a.m.    DVT Prophylaxis Coumadin  AM Labs Ordered, also please review Full Orders  Code Status full  Disposition Plan: Home  Time spent in minutes : 38 minutes  Condition GUARDED   @SIGNATURE @

## 2016-10-01 NOTE — Telephone Encounter (Signed)
No treatment for UC ED 09/29/16 per Christiana D

## 2016-10-01 NOTE — ED Provider Notes (Signed)
Since seen and evaluated. DW Dr. Manya Silvas.  Patient complains of intermittent bloody stools over the last year. Also intermittent abdominal pain. Complains of left lower quadrant pain with some blood in his stools yesterday. No fever. Pain is intermittent. Denies fever vomiting. Labs show hemoglobin 8.1.  Patient underwent incision and drainage of left buttock abscess 6/30.  Has had some bleeding since then was rechecked on 7/1. Patient has history of Billroth II anastomosis and partial gastrectomy and chronic anemia secondary to chronic blood loss. He went transfusion 4/18 of 18.  Plan CT imaging. Rule out diverticulitis/diverticulosis. His guaiac positive on rectal exam. No frank bleeding from I and D site. We'll reexamine after completion of labs and studies.     Tanna Furry, MD 10/01/16 [phone]

## 2016-10-01 NOTE — ED Notes (Signed)
Patient transported to CT 

## 2016-10-02 DIAGNOSIS — K5900 Constipation, unspecified: Principal | ICD-10-CM

## 2016-10-02 DIAGNOSIS — I4892 Unspecified atrial flutter: Secondary | ICD-10-CM

## 2016-10-02 DIAGNOSIS — R109 Unspecified abdominal pain: Secondary | ICD-10-CM

## 2016-10-02 DIAGNOSIS — D689 Coagulation defect, unspecified: Secondary | ICD-10-CM

## 2016-10-02 DIAGNOSIS — R195 Other fecal abnormalities: Secondary | ICD-10-CM

## 2016-10-02 DIAGNOSIS — E1100 Type 2 diabetes mellitus with hyperosmolarity without nonketotic hyperglycemic-hyperosmolar coma (NKHHC): Secondary | ICD-10-CM

## 2016-10-02 DIAGNOSIS — D649 Anemia, unspecified: Secondary | ICD-10-CM

## 2016-10-02 DIAGNOSIS — D62 Acute posthemorrhagic anemia: Secondary | ICD-10-CM

## 2016-10-02 DIAGNOSIS — I480 Paroxysmal atrial fibrillation: Secondary | ICD-10-CM

## 2016-10-02 DIAGNOSIS — R1084 Generalized abdominal pain: Secondary | ICD-10-CM

## 2016-10-02 DIAGNOSIS — E785 Hyperlipidemia, unspecified: Secondary | ICD-10-CM

## 2016-10-02 DIAGNOSIS — I10 Essential (primary) hypertension: Secondary | ICD-10-CM

## 2016-10-02 DIAGNOSIS — I5032 Chronic diastolic (congestive) heart failure: Secondary | ICD-10-CM

## 2016-10-02 DIAGNOSIS — R1032 Left lower quadrant pain: Secondary | ICD-10-CM

## 2016-10-02 LAB — CBC
HCT: 23.8 % — ABNORMAL LOW (ref 39.0–52.0)
Hemoglobin: 7 g/dL — ABNORMAL LOW (ref 13.0–17.0)
MCH: 23.8 pg — ABNORMAL LOW (ref 26.0–34.0)
MCHC: 29.4 g/dL — ABNORMAL LOW (ref 30.0–36.0)
MCV: 81 fL (ref 78.0–100.0)
PLATELETS: 335 10*3/uL (ref 150–400)
RBC: 2.94 MIL/uL — ABNORMAL LOW (ref 4.22–5.81)
RDW: 17.4 % — AB (ref 11.5–15.5)
WBC: 5.2 10*3/uL (ref 4.0–10.5)

## 2016-10-02 LAB — COMPREHENSIVE METABOLIC PANEL
ALT: 26 U/L (ref 17–63)
AST: 31 U/L (ref 15–41)
Albumin: 2.9 g/dL — ABNORMAL LOW (ref 3.5–5.0)
Alkaline Phosphatase: 73 U/L (ref 38–126)
Anion gap: 10 (ref 5–15)
BILIRUBIN TOTAL: 0.3 mg/dL (ref 0.3–1.2)
BUN: 11 mg/dL (ref 6–20)
CHLORIDE: 105 mmol/L (ref 101–111)
CO2: 21 mmol/L — ABNORMAL LOW (ref 22–32)
CREATININE: 0.82 mg/dL (ref 0.61–1.24)
Calcium: 8.3 mg/dL — ABNORMAL LOW (ref 8.9–10.3)
Glucose, Bld: 86 mg/dL (ref 65–99)
Potassium: 4.1 mmol/L (ref 3.5–5.1)
Sodium: 136 mmol/L (ref 135–145)
TOTAL PROTEIN: 5.7 g/dL — AB (ref 6.5–8.1)

## 2016-10-02 LAB — HEMATOCRIT
HEMATOCRIT: 25.6 % — AB (ref 39.0–52.0)
HEMATOCRIT: 29.6 % — AB (ref 39.0–52.0)

## 2016-10-02 LAB — MAGNESIUM: MAGNESIUM: 2 mg/dL (ref 1.7–2.4)

## 2016-10-02 LAB — GLUCOSE, CAPILLARY
GLUCOSE-CAPILLARY: 106 mg/dL — AB (ref 65–99)
GLUCOSE-CAPILLARY: 82 mg/dL (ref 65–99)
Glucose-Capillary: 152 mg/dL — ABNORMAL HIGH (ref 65–99)
Glucose-Capillary: 78 mg/dL (ref 65–99)

## 2016-10-02 LAB — PHOSPHORUS: PHOSPHORUS: 3.2 mg/dL (ref 2.5–4.6)

## 2016-10-02 LAB — PREPARE RBC (CROSSMATCH)

## 2016-10-02 MED ORDER — SODIUM CHLORIDE 0.9 % IV SOLN
Freq: Once | INTRAVENOUS | Status: AC
  Administered 2016-10-02: 12:00:00 via INTRAVENOUS

## 2016-10-02 MED ORDER — FUROSEMIDE 10 MG/ML IJ SOLN
20.0000 mg | Freq: Once | INTRAMUSCULAR | Status: AC
  Administered 2016-10-02: 20 mg via INTRAVENOUS
  Filled 2016-10-02: qty 2

## 2016-10-02 NOTE — Progress Notes (Signed)
PROGRESS NOTE    [name]  JOI:[id number] DOB: 1940/12/13 DOA: 10/01/2016 PCP: Bernerd Limbo, MD   Brief Narrative:  [name]  is a 76 y.o. male,With past medical history significant for at A. Fib/Flutter on flecainide and Coumadin and history of gastric ulcer status post gastrectomy in the past presenting with 9 months history of abdominal pain left lower quadrant associated with anemia. Patient came to the emergency room around 3 days ago with the same complaint and his hemoglobin was 8.7. His hemoglobin now is 8.1 and his stool guaiac was positive. No history of nausea vomiting or diarrhea but he took clindamycin for right buttocks abscess in the past. The pain has got worse in the last few days according to the patient who is rolling in bed due to pain. No history of fever or chills. CT scan in the emergency room showed possible constipation with diverticulosis without evidence of diverticulitis. Gastroenterology was consulted and awaiting to see the patient still.   Assessment & Plan:   Active Problems:   Atrial flutter - status post CTI ablation   Essential hypertension   Chronic diastolic congestive heart failure (HCC)   Hyperlipidemia   Type 2 diabetes mellitus (HCC)   PAF (paroxysmal atrial fibrillation), maintaining SR; CHA2DS2Vasc = 7   Anemia   Symptomatic anemia   Heme positive stool   Left lower quadrant pain   Coagulopathy (HCC)   Acute blood loss anemia   Abdominal pain   Constipation  Abdominal Pain -CT Scan of the Abdomen showed Status post partial gastrectomy and gastrojejunostomy. Colonic stool burden suggests constipation.Scattered colonic diverticula. No evidence of diverticulitis. Normal terminal ileum. Appendix grossly normal but suboptimally assessed evaluated secondary to motion. Normal small bowel. -? Related to GIB? -C/w Tramadol for Pain Control -C/w Clear Liquid Diet -Gastroenterology consulted and appreciate evaluation and further  recommendations  Acute Blood Loss Anemia in the setting of Chronic Iron Deficiency Anemia Likely 2/2 to suspected GIB -FOBT was positive -Stopped Iron Supplementation and Anticoagulation with Coumadin -Patient's Hb/Hct went from 8.1/25.9 -> 7.0/23.8 -Type and Screen and transfuse 2 units of pRBC's with IV 20 mg of Lasix -Repeat CBC in AM -Continue to Monitor for S/Sx of Bleeding  Suspected GIB -Has a Hx of Gastric Ulcer Status post partial gastrectomy and gastrojejunostomy. Colonic stool burden suggests constipation -Hold Coumadin and Voltaren gel; INR was 2.52 -C/w IV Pantoprazole 40 mg q12h -Gastroenterology consulted and appreciated Recc's -C/w Clear Liquid Diet for now  Atrial Flutter/Atrial Fibrillation -C/w Telemetry -C/w Flecanide 225 mg po q12h as well as Diltazem 120 mg po daily -Continue to Hold Coumadin -Check Daily PT-INR  Constipation -C/w Miralax 17 grams po Daily  Diabetes Mellitus Type 2 -Home Metformin 1000 mg po BID with a Meal -C/w Lantus 8 units sq qHS and Sensitive Novolog SSI AC -CBG's ranging from 82-152 -Check HbA1c  Hyperlipidemia -C/w Pravastatin 40 mg po qHS  Hx of Diastolic CHF -Currently compensated -Continue to Monitor Volume Status  DVT prophylaxis: SCDs; Coumadin is Held Code Status: FULL CODE Family Communication: No family present at bedside Disposition Plan: Pending further workup  Consultants:   Gastroenterology (Consulted [facility] but was told patient was an unassigned patient so then called [facility] and awaiting evaluation). [facility] secretary [place] stated she would refer consult to Dr. Arta Silence.    Procedures: None   Antimicrobials:  Anti-infectives    None     Subjective: Seen and examined at bedside and was extremely hungry. No nausea  or vomiting. States he was having blood with his stools and has had a GI ulcer in the past. No other concerns or complaints at this time.   Objective: Vitals:     10/02/16 1416 10/02/16 1441 10/02/16 1726 10/02/16 1837  BP: (!) 102/56 (!) 105/56 (!) 108/54 101/60  Pulse: 69 70 60 (!) 58  Resp: 20  20 20   Temp: 98.6 F (37 C) 98.7 F (37.1 C) (!) 97.3 F (36.3 C) (!) 97.5 F (36.4 C)  TempSrc: Oral Oral Oral Oral  SpO2:    97%  Weight:      Height:        Intake/Output Summary (Last 24 hours) at 10/02/16 2044 Last data filed at 10/02/16 1837  Gross per 24 hour  Intake              633 ml  Output              625 ml  Net                8 ml   Filed Weights   10/01/16 1357  Weight: 78.5 kg (173 lb)   Examination: Physical Exam:  Constitutional: WN/WD, NAD and appears calm and comfortable Eyes: Lids and conjunctivae normal, sclerae anicteric  ENMT: External Ears, Nose appear normal. Grossly normal hearing. Mucous membranes are moist.  Neck: Appears normal, supple, no cervical masses, normal ROM, no appreciable thyromegaly Respiratory: Diminished to auscultation bilaterally, no wheezing, rales, rhonchi or crackles. Normal respiratory effort and patient is not tachypenic. No accessory muscle use.  Cardiovascular: RRR, no murmurs / rubs / gallops. S1 and S2 auscultated. No extremity edema.   Abdomen: Soft, mildly tender, non-distended. No masses palpated. No appreciable hepatosplenomegaly. Bowel sounds positive x4.  GU: Deferred. Musculoskeletal: No clubbing / cyanosis of digits/nails. No joint deformity upper and lower extremities.  Skin: No rashes, lesions, ulcers. No induration; Warm and dry.  Neurologic: CN 2-12 grossly intact with no focal deficits. Sensation intact in all 4 Extremities. Romberg sign cerebellar reflexes not assessed.  Psychiatric: Normal judgment and insight. Alert and oriented x 3. Normal mood and appropriate affect.   Data Reviewed: I have personally reviewed following labs and imaging studies  CBC:  Recent Labs Lab 09/28/16 2352 10/01/16 1402 10/01/16 2035 10/02/16 0424 10/02/16 1205  WBC 10.8* 6.1  --   5.2  --   NEUTROABS 8.9*  --   --   --   --   HGB 8.7* 8.1*  --  7.0*  --   HCT 29.0* 27.2* 25.9* 23.8* 25.6*  MCV 82.2 80.5  --  81.0  --   PLT 361 347  --  335  --    Basic Metabolic Panel:  Recent Labs Lab 09/28/16 2352 10/01/16 1402 10/02/16 1205  NA 131* 135 136  K 3.9 3.7 4.1  CL 98* 101 105  CO2 23 24 21*  GLUCOSE 140* 181* 86  BUN 15 14 11   CREATININE 0.86 0.84 0.82  CALCIUM 9.6 8.7* 8.3*  MG  --   --  2.0  PHOS  --   --  3.2   GFR: Estimated Creatinine Clearance: 74.4 mL/min (by C-G formula based on SCr of 0.82 mg/dL). Liver Function Tests:  Recent Labs Lab 09/28/16 2352 10/01/16 1402 10/02/16 1205  AST 25 24 31   ALT 26 29 26   ALKPHOS 78 79 73  BILITOT 0.3 0.3 0.3  PROT 6.4* 6.3* 5.7*  ALBUMIN 3.4* 3.2* 2.9*  Recent Labs Lab 10/01/16 1402  LIPASE 28   No results for input(s): AMMONIA in the last 168 hours. Coagulation Profile:  Recent Labs Lab 09/28/16 2352 10/01/16 1929  INR 1.76 2.52   Cardiac Enzymes:  Recent Labs Lab 09/28/16 2352  TROPONINI <0.03   BNP (last 3 results) No results for input(s): PROBNP in the last 8760 hours. HbA1C: No results for input(s): HGBA1C in the last 72 hours. CBG:  Recent Labs Lab 10/01/16 2245 10/02/16 0824 10/02/16 1245 10/02/16 1804  GLUCAP 145* 106* 82 152*   Lipid Profile: No results for input(s): CHOL, HDL, LDLCALC, TRIG, CHOLHDL, LDLDIRECT in the last 72 hours. Thyroid Function Tests: No results for input(s): TSH, T4TOTAL, FREET4, T3FREE, THYROIDAB in the last 72 hours. Anemia Panel: No results for input(s): VITAMINB12, FOLATE, FERRITIN, TIBC, IRON, RETICCTPCT in the last 72 hours. Sepsis Labs:  Recent Labs Lab 09/28/16 2349  LATICACIDVEN 1.4    Recent Results (from the past 240 hour(s))  Urine culture     Status: Abnormal   Collection Time: 09/28/16 12:11 AM  Result Value Ref Range Status   Specimen Description URINE, RANDOM  Final   Special Requests NONE  Final   Culture  MULTIPLE ORGANISMS PRESENT, NONE PREDOMINANT (A)  Final   Report Status 09/30/2016 FINAL  Final    Radiology Studies: Ct Abdomen Pelvis W Contrast  Result Date: 10/01/2016 CLINICAL DATA:  Intermittent bloody stools over the past year. Left lower quadrant pain. Decreased hemoglobin. Recent left buttock abscess drainage. Partial gastrectomy secondary to chronic blood loss. Rule out diverticulitis or diverticulosis. EXAM: CT ABDOMEN AND PELVIS WITH CONTRAST TECHNIQUE: Multidetector CT imaging of the abdomen and pelvis was performed using the standard protocol following bolus administration of intravenous contrast. CONTRAST:  147mL ISOVUE-300 IOPAMIDOL (ISOVUE-300) INJECTION 61% COMPARISON:  07/10/2016 FINDINGS: Mild to moderate motion degradation throughout. Lower chest: Clear lung bases. Mild cardiomegaly with right coronary artery atherosclerosis. Surgical changes at the gastroesophageal junction. Hepatobiliary: Normal liver. Cholecystectomy, without biliary ductal dilatation. Pancreas: Mild to moderate pancreatic atrophy, without duct dilatation or acute inflammation. Spleen: Normal in size, without focal abnormality. Adrenals/Urinary Tract: Normal adrenal glands. Lower pole right renal collecting system stone or stones on the order of 4 mm. Normal left kidney, without hydronephrosis. Normal urinary bladder. Stomach/Bowel: Status post partial gastrectomy and gastrojejunostomy. Colonic stool burden suggests constipation. Scattered colonic diverticula. No evidence of diverticulitis. Normal terminal ileum. Appendix grossly normal but suboptimally assessed evaluated secondary to motion. Normal small bowel. Vascular/Lymphatic: Aortic and branch vessel atherosclerosis. No abdominopelvic adenopathy. Reproductive: Mild prostatomegaly. Other: Subcutaneous thickening about the left gluteal crease, including on image 90/ series 3. Incompletely imaged. Musculoskeletal: Disc bulges at L3-4 and L2-3. IMPRESSION: 1.  Mild-to-moderate motion degraded exam. 2.  Possible constipation. 3. Scattered diverticulosis without evidence of diverticulitis. 4. Right nephrolithiasis. 5. Coronary artery atherosclerosis. Aortic Atherosclerosis (ICD10-I70.0). 6. Nonspecific soft tissue thickening about the left gluteal crease. Likely the site of reported abscess drainage. This is incompletely imaged and should be correlated with physical exam. Electronically Signed   By: Abigail Miyamoto M.D.   On: 10/01/2016 17:29   Scheduled Meds: . diltiazem  120 mg Oral Daily  . flecainide  225 mg Oral Q12H  . insulin aspart  0-9 Units Subcutaneous TID WC  . insulin glargine  8 Units Subcutaneous QHS  . loratadine  10 mg Oral QPM  . multivitamin with minerals  1 tablet Oral Daily  . pantoprazole (PROTONIX) IV  40 mg Intravenous Q12H  . polyethylene glycol  17 g Oral Daily  . pravastatin  40 mg Oral QPM  . sodium chloride flush  3 mL Intravenous Q12H   Continuous Infusions: . sodium chloride       LOS: 1 day   Kerney Elbe, DO [facility] Pager [phone]  If 7PM-7AM, please contact night-coverage [email or web address] Password [facility] 10/02/2016, 8:44 PM

## 2016-10-03 DIAGNOSIS — D509 Iron deficiency anemia, unspecified: Secondary | ICD-10-CM

## 2016-10-03 DIAGNOSIS — K649 Unspecified hemorrhoids: Secondary | ICD-10-CM

## 2016-10-03 DIAGNOSIS — K921 Melena: Secondary | ICD-10-CM

## 2016-10-03 LAB — CBC WITH DIFFERENTIAL/PLATELET
Basophils Absolute: 0.1 10*3/uL (ref 0.0–0.1)
Basophils Relative: 1 %
EOS PCT: 3 %
Eosinophils Absolute: 0.2 10*3/uL (ref 0.0–0.7)
HCT: 31 % — ABNORMAL LOW (ref 39.0–52.0)
Hemoglobin: 9.5 g/dL — ABNORMAL LOW (ref 13.0–17.0)
LYMPHS ABS: 0.6 10*3/uL — AB (ref 0.7–4.0)
LYMPHS PCT: 10 %
MCH: 24.2 pg — AB (ref 26.0–34.0)
MCHC: 30.3 g/dL (ref 30.0–36.0)
MCV: 79.7 fL (ref 78.0–100.0)
MONO ABS: 0.7 10*3/uL (ref 0.1–1.0)
MONOS PCT: 11 %
NEUTROS ABS: 4.8 10*3/uL (ref 1.7–7.7)
Neutrophils Relative %: 76 %
PLATELETS: 312 10*3/uL (ref 150–400)
RBC: 3.89 MIL/uL — ABNORMAL LOW (ref 4.22–5.81)
RDW: 17.6 % — AB (ref 11.5–15.5)
WBC: 6.3 10*3/uL (ref 4.0–10.5)

## 2016-10-03 LAB — BPAM RBC
BLOOD PRODUCT EXPIRATION DATE: 201807202359
BLOOD PRODUCT EXPIRATION DATE: 201807212359
ISSUE DATE / TIME: 201807091818
ISSUE DATE / TIME: 201807091359
UNIT TYPE AND RH: 600
UNIT TYPE AND RH: 600

## 2016-10-03 LAB — TYPE AND SCREEN
ABO/RH(D): A NEG
ANTIBODY SCREEN: NEGATIVE
Unit division: 0
Unit division: 0

## 2016-10-03 LAB — COMPREHENSIVE METABOLIC PANEL
ALT: 29 U/L (ref 17–63)
ANION GAP: 9 (ref 5–15)
AST: 28 U/L (ref 15–41)
Albumin: 3 g/dL — ABNORMAL LOW (ref 3.5–5.0)
Alkaline Phosphatase: 80 U/L (ref 38–126)
BILIRUBIN TOTAL: 0.9 mg/dL (ref 0.3–1.2)
BUN: 12 mg/dL (ref 6–20)
CO2: 24 mmol/L (ref 22–32)
Calcium: 8.4 mg/dL — ABNORMAL LOW (ref 8.9–10.3)
Chloride: 103 mmol/L (ref 101–111)
Creatinine, Ser: 0.94 mg/dL (ref 0.61–1.24)
Glucose, Bld: 107 mg/dL — ABNORMAL HIGH (ref 65–99)
POTASSIUM: 3.5 mmol/L (ref 3.5–5.1)
Sodium: 136 mmol/L (ref 135–145)
TOTAL PROTEIN: 5.7 g/dL — AB (ref 6.5–8.1)

## 2016-10-03 LAB — GLUCOSE, CAPILLARY
GLUCOSE-CAPILLARY: 94 mg/dL (ref 65–99)
Glucose-Capillary: 119 mg/dL — ABNORMAL HIGH (ref 65–99)
Glucose-Capillary: 127 mg/dL — ABNORMAL HIGH (ref 65–99)
Glucose-Capillary: 162 mg/dL — ABNORMAL HIGH (ref 65–99)

## 2016-10-03 LAB — PROTIME-INR
INR: 1.79
Prothrombin Time: 21 seconds — ABNORMAL HIGH (ref 11.4–15.2)

## 2016-10-03 LAB — PHOSPHORUS: PHOSPHORUS: 3.1 mg/dL (ref 2.5–4.6)

## 2016-10-03 LAB — MAGNESIUM: MAGNESIUM: 1.9 mg/dL (ref 1.7–2.4)

## 2016-10-03 LAB — HEMATOCRIT: HCT: 33.5 % — ABNORMAL LOW (ref 39.0–52.0)

## 2016-10-03 MED ORDER — SENNOSIDES-DOCUSATE SODIUM 8.6-50 MG PO TABS
1.0000 | ORAL_TABLET | Freq: Two times a day (BID) | ORAL | Status: DC
  Administered 2016-10-03: 1 via ORAL
  Filled 2016-10-03: qty 1

## 2016-10-03 MED ORDER — SENNOSIDES-DOCUSATE SODIUM 8.6-50 MG PO TABS: 1.0000 | ORAL_TABLET | Freq: Two times a day (BID) | ORAL | 0 refills | Status: DC

## 2016-10-03 MED ORDER — ONDANSETRON 4 MG PO TBDP
4.0000 mg | ORAL_TABLET | Freq: Three times a day (TID) | ORAL | Status: DC | PRN
  Administered 2016-10-03: 4 mg via ORAL
  Filled 2016-10-03: qty 1

## 2016-10-03 MED ORDER — POLYETHYLENE GLYCOL 3350 17 G PO PACK: 17.0000 g | PACK | Freq: Two times a day (BID) | ORAL | Status: DC

## 2016-10-03 MED ORDER — WARFARIN SODIUM 5 MG PO TABS
7.5000 mg | ORAL_TABLET | Freq: Once | ORAL | Status: AC
  Administered 2016-10-03: 7.5 mg via ORAL
  Filled 2016-10-03: qty 2

## 2016-10-03 MED ORDER — PANTOPRAZOLE SODIUM 40 MG PO TBEC: 40.0000 mg | DELAYED_RELEASE_TABLET | Freq: Two times a day (BID) | ORAL | Status: DC

## 2016-10-03 MED ORDER — WARFARIN - PHARMACIST DOSING INPATIENT: Freq: Every day | Status: DC

## 2016-10-03 MED ORDER — POLYETHYLENE GLYCOL 3350 17 G PO PACK: 17.0000 g | PACK | Freq: Two times a day (BID) | ORAL | 0 refills | Status: DC

## 2016-10-03 MED ORDER — PANTOPRAZOLE SODIUM 40 MG PO TBEC: 40.0000 mg | DELAYED_RELEASE_TABLET | Freq: Two times a day (BID) | ORAL | 0 refills | Status: DC

## 2016-10-03 NOTE — Progress Notes (Signed)
ANTICOAGULATION CONSULT NOTE - Initial Consult  Pharmacy Consult:  Coumadin Indication: atrial fibrillation  No Known Allergies  Patient Measurements: Height: 5\' 6"  (167.6 cm) Weight: 173 lb (78.5 kg) IBW/kg (Calculated) : 63.8  Vital Signs: Temp: 98.4 F (36.9 C) (07/10 0539) Temp Source: Oral (07/10 0539) BP: 95/51 (07/10 0539) Pulse Rate: 66 (07/10 0539)  Labs:  Recent Labs  10/01/16 1402 10/01/16 1929  10/02/16 0424 10/02/16 1205 10/02/16 2237 10/03/16 0437  HGB 8.1*  --   --  7.0*  --   --  9.5*  HCT 27.2*  --   < > 23.8* 25.6* 29.6* 31.0*  PLT 347  --   --  335  --   --  312  LABPROT  --  27.7*  --   --   --   --  21.0*  INR  --  2.52  --   --   --   --  1.79  CREATININE 0.84  --   --   --  0.82  --  0.94  < > = values in this interval not displayed.  Estimated Creatinine Clearance: 64.9 mL/min (by C-G formula based on SCr of 0.94 mg/dL).   Medical History: Past Medical History:  Diagnosis Date  . Anemia    takes Ferrous Sulfate daily  . Arthritis    "all over"  . Atrial flutter ([place])    a. 07/2010 Status post caval tricuspid isthmus ablation by Dr. Midge Aver Metoprolol daily  . Balance problem 01/2014  . CAP (community acquired pneumonia) 09/18/2014  . Cervical radiculopathy due to degenerative joint disease of spine   . COPD (chronic obstructive pulmonary disease) ([place])   . Coronary artery disease, non-occlusive    a. 03/2010 Nonocclusive disease by cath, performed for ST elevations on ECG;  b. 06/2013 Lexi MV: EF 60%, no ischemia.  . Diabetes mellitus type II    takes Metformin and Lantus daily  . Diastolic CHF, chronic ([place])    a. 12/2012 EF 55-60%, diast dysfxn, triv MR, mildly dil LA/RA.  [name] Dysrhythmia    HX OF ATRIAL IFB /FLUTTER takes Flecanide and Coumadin daily  . History of blood transfusion 1982   "when I had stomach OR"  . History of bronchitis    1998  . History of gastric ulcer   . HTN (hypertension)    takes Diltiazem daily  .  Hyperlipidemia    takes Pravastatin daily  . Joint pain   . PAF (paroxysmal atrial fibrillation) ([place])    a. Recurrent after atrial flutter, currently controlled on flecainide plus diltiazem  . Pneumonia 1999  . Weakness    numbness and tingling both hands      Assessment: 69 YOM with history of Afib on Coumadin PTA.  Coumadin held due to +FOB on admission.  GI recommended to weigh risk vs benefit with continuing AC because anemia and bleeding will likely be a chronic issue with AC.  Pharmacy consulted to resume Coumadin.  INR trended down to 1.79.   Goal of Therapy:  INR 2-3 Monitor platelets by anticoagulation protocol: Yes    Plan:  - Coumadin 7.5mg  PO today - Daily PT / INR - Monitor CBC closely   Mairely Foxworth D. Mina Marble, PharmD, BCPS Pager:  [phone] 10/03/2016, 11:24 AM

## 2016-10-03 NOTE — Discharge Instructions (Signed)

## 2016-10-03 NOTE — Discharge Summary (Signed)
Physician Discharge Summary  [name]:[id number] DOB: 1940-11-15 DOA: 10/01/2016  PCP: Bernerd Limbo, MD  Admit date: 10/01/2016 Discharge date: 10/03/2016  Admitted From: Home Disposition:  Home with [place] PT/RN  Recommendations for Outpatient Follow-up:  1. Follow up with PCP in 1-2 weeks; an appointment was made for you to follow up with Trevor Pounds, FNP on 10/06/16 at 1:20 pm 2. Follow up with Gastroenterology Dr. Paulita Fujita as an outpatient 3. Follow up with Cardiology as an outpatient 4. Please Repeat PT-INR on Friday for Coumadin check 5. Discuss with patient about Maintenance IV Iron as long term options given malabsorption of po Iron 6. Please obtain CMP/CBC, Mag, Phos in one week  Home Health: [facility] Equipment/Devices: Conservation officer, nature with 5" Wheels  Discharge Condition: Stable CODE STATUS: FULL CODE Diet recommendation: Heart Healthy Carb Modified  Diet  Brief/Interim Summary: [name] a 76 y.o.male,With past medical history significant for at A. Fib/Flutter on flecainide and Coumadin and history of gastric ulcer status post gastrectomy in the past presenting with 9 months history of abdominal pain left lower quadrant associated with anemia. Patient came to the emergency room around 3 days ago with the same complaint and his hemoglobin was 8.7. His hemoglobin on admission was 8.1 and his stool guaiac was positive. No history of nausea vomiting or diarrhea but he took clindamycin for right buttocks abscess in the past. The pain has got worse in the last few days according to the patient who is rolling in bed due to pain. No history of fever or chills. CT scan in the emergency room showed possible constipation with diverticulosis without evidence of diverticulitis. Hemoglobin dropped to 7 so patient was transfused 2 units of pRBC's. Gastroenterology was consulted and Dr. Paulita Fujita felt that the hematochezia patient had was related to constipation and hemorrhoids and  did not recommend further endoscopic Evaluation. It was recommended to the patient to remain on PPI Indefinitely and outpatient IV Iron transfusions. Risks of continuing Coumadin were discussed with the patient and he would like to go back on Coumadin so it was resumed. Patient was deemed medically stable and PT evaluated him and recommended home Health. He will be D/C'd home and follow up with PCP on Friday 10/06/16 and he will need to follow up with Gastroenterology and Cardiology as an outpatient.   Discharge Diagnoses:  Active Problems:   Atrial flutter - status post CTI ablation   Essential hypertension   Chronic diastolic congestive heart failure (HCC)   Hyperlipidemia   Type 2 diabetes mellitus (HCC)   PAF (paroxysmal atrial fibrillation), maintaining SR; CHA2DS2Vasc = 7   Anemia   Symptomatic anemia   Heme positive stool   Left lower quadrant pain   Coagulopathy (HCC)   Acute blood loss anemia   Abdominal pain   Constipation  Abdominal Pain likely related to Constipation -CT Scan of the Abdomen showed Status post partial gastrectomy and gastrojejunostomy. Colonic stool burden suggests constipation.Scattered colonic diverticula. No evidence of diverticulitis. Normal terminal ileum. Appendix grossly normal but suboptimally assessed evaluated secondary to motion. Normal small bowel. -C/w Tramadol for Pain Control -Diet advanced from Clear to Soft -Bowel Regimen given -Gastroenterology consulted and appreciate evaluation and further recommendations; Recommended a good bowel regimen, minimizing narcotics, and no further endoscopic evaluation -Follow up with PCP and Gastroenterology as an outpatient   Acute Blood Loss Anemia/Hematochezia in the setting of Chronic Iron Deficiency Anemia Likely 2/2 to Constipation in the setting of Hemorrhoids -FOBT was positive -Stopped Iron Supplementation and  Anticoagulation with Coumadin -Will need outpatient IV Iron Infusions -Patient is s/p 2  units of pRBC's -Patient's Hb/Hct went from 8.1/25.9 -> 7.0/23.8 -> 9.5/31.0 -Repeat CBC as an outpatient  -Continue to Monitor for S/Sx of Bleeding -Follow up with PCP and Gastroenterology  Hematochezia likely from Hemorrhoids -Has a Hx of Gastric Ulcer Status post partial gastrectomy and gastrojejunostomy. Colonic stool burden suggests constipation -Held Coumadin and Voltaren gel; INR was 2.52 -Was on IV Pantoprazole 40 mg q12h and transitioned to po Protonix 40 mg BID -Gastroenterology consulted and appreciated Recc's -GI Recommended no Further Endoscopic evaluation as they didn't feel that it would be helpful as he has had two endoscopies, last April 2018, and one colonoscopy, September 2015, for these exact issues. -GI recommended PPI indefinitely and No NSAIDs indefinitely -Will need Outpatient IV Iron Infusions for maintenance   Atrial Flutter/Atrial Fibrillation -C/w Telemetry -C/w Flecanide 225 mg po q12h as well as Diltazem 120 mg po daily -Resume Coumadin and Metoprolol  -Check PT-INR with PCP   Constipation -C/w Miralax 17 grams po BID and Senna-Docusate  Diabetes Mellitus Type 2 -Resume Metformin 1000 mg po BID with a Meal -C/w Lantus 8 units sq qHS and Sensitive Novolog SSI AC -CBG's ranging from 94-162 -Check HbA1c as an outpatient   Hyperlipidemia -C/w Pravastatin 40 mg po qHS  Hx of Diastolic CHF -Currently compensated -Continue to Monitor Volume Status  Discharge Instructions  Discharge Instructions    Call MD for:  difficulty breathing, headache or visual disturbances    Complete by:  As directed    Call MD for:  extreme fatigue    Complete by:  As directed    Call MD for:  hives    Complete by:  As directed    Call MD for:  persistant dizziness or light-headedness    Complete by:  As directed    Call MD for:  persistant nausea and vomiting    Complete by:  As directed    Call MD for:  redness, tenderness, or signs of infection (pain,  swelling, redness, odor or green/yellow discharge around incision site)    Complete by:  As directed    Call MD for:  severe uncontrolled pain    Complete by:  As directed    Call MD for:  temperature >100.4    Complete by:  As directed    Diet - low sodium heart healthy    Complete by:  As directed    Discharge instructions    Complete by:  As directed    Follow up with PCP and Gastroenterology as an outpatient. Take all medications as prescribed. Discuss with PCP about Iron Transfusions and repeat PT/INR in AM. If symptoms worsen or change please return to the ED for evaluation.   Increase activity slowly    Complete by:  As directed      Allergies as of 10/03/2016   No Known Allergies     Medication List    STOP taking these medications   clindamycin 300 MG capsule Commonly known as:  CLEOCIN   diclofenac sodium 1 % Gel Commonly known as:  VOLTAREN     TAKE these medications   acetaminophen 650 MG CR tablet Commonly known as:  TYLENOL Take 650 mg by mouth 2 (two) times daily as needed for pain. Notes to patient:  Last received at 9:30am 10/03/16   cyclobenzaprine 5 MG tablet Commonly known as:  FLEXERIL Take 2 tablets (10 mg total) by mouth 3 (three)  times daily as needed for muscle spasms. Notes to patient:  Last dose received at 9:30am 10/03/16   diltiazem 120 MG 24 hr capsule Commonly known as:  CARDIZEM CD Take 1 capsule (120 mg total) by mouth daily.   ferrous sulfate 325 (65 FE) MG tablet Take 325 mg by mouth daily with breakfast.   flecainide 150 MG tablet Commonly known as:  TAMBOCOR TAKE 1 AND 1/2 TABLETS TWICE DAILY What changed:  See the new instructions.   Insulin Glargine 100 UNIT/ML Solostar Pen Commonly known as:  LANTUS Inject 8 Units into the skin at bedtime. What changed:  when to take this   loratadine 10 MG tablet Commonly known as:  CLARITIN Take 10 mg by mouth every evening.   metFORMIN 1000 MG tablet Commonly known as:   GLUCOPHAGE Take 1,000 mg by mouth 2 (two) times daily with a meal.   metoprolol succinate 25 MG 24 hr tablet Commonly known as:  TOPROL-XL TAKE 1/2 TABLET DAILY. (NEED MD APPT FOR FURTHER REFILLS) What changed:  See the new instructions.   multivitamin with minerals Tabs tablet Take 1 tablet by mouth 2 (two) times daily.   nitroGLYCERIN 0.4 MG SL tablet Commonly known as:  NITROSTAT Place 1 tablet (0.4 mg total) under the tongue every 5 (five) minutes x 3 doses as needed for chest pain.   pantoprazole 40 MG tablet Commonly known as:  PROTONIX Take 1 tablet (40 mg total) by mouth 2 (two) times daily.   polyethylene glycol packet Commonly known as:  MIRALAX / GLYCOLAX Take 17 g by mouth 2 (two) times daily.   pravastatin 40 MG tablet Commonly known as:  PRAVACHOL TAKE 1 TABLET EVERY EVENING What changed:  See the new instructions.   senna-docusate 8.6-50 MG tablet Commonly known as:  Senokot-S Take 1 tablet by mouth 2 (two) times daily.   traMADol 50 MG tablet Commonly known as:  ULTRAM Take 1 tablet (50 mg total) by mouth every 6 (six) hours as needed. What changed:  reasons to take this Notes to patient:  Last dose received at 9:30am 10/03/16   warfarin 5 MG tablet Commonly known as:  COUMADIN TAKE 1 TO 1 AND 1/2 TABLETS DAILY AS DIRECTED What changed:  See the new instructions.            Durable Medical Equipment        Start     Ordered   10/03/16 1437  For home use only DME Walker rolling  [facility])  Once    Question:  Patient needs a walker to treat with the following condition  Answer:  Weakness   10/03/16 1437     Follow-up Information    Bernerd Limbo, MD. Go on 10/06/2016.   Specialty:  Family Medicine Why:  Follow up with PCP scheduled at 1:20 pm on 10/06/16 with Trevor Pounds, FNP Contact information: [place] [address] [phone]        Arta Silence, MD. Call.   Specialty:  Gastroenterology Why:  Follow  up with Dr. Paulita Fujita for Hospital Follow up. Contact information: [address] [place] [address] [phone]          No Known Allergies  Consultations:  Gastroenterology Dr. Arta Silence  Procedures/Studies: Dg Chest 2 View  Result Date: 09/28/2016 CLINICAL DATA:  Back pain EXAM: CHEST  2 VIEW COMPARISON:  Chest radiograph 07/10/2016 FINDINGS: Cardiac silhouette is unchanged. There are also unchanged prominent interstitial opacities. No  focal consolidation or pulmonary edema. No pleural effusion or pneumothorax. IMPRESSION: No active cardiopulmonary disease. Electronically Signed   By: Ulyses Jarred M.D.   On: 09/28/2016 23:58   Ct Abdomen Pelvis W Contrast  Result Date: 10/01/2016 CLINICAL DATA:  Intermittent bloody stools over the past year. Left lower quadrant pain. Decreased hemoglobin. Recent left buttock abscess drainage. Partial gastrectomy secondary to chronic blood loss. Rule out diverticulitis or diverticulosis. EXAM: CT ABDOMEN AND PELVIS WITH CONTRAST TECHNIQUE: Multidetector CT imaging of the abdomen and pelvis was performed using the standard protocol following bolus administration of intravenous contrast. CONTRAST:  134mL ISOVUE-300 IOPAMIDOL (ISOVUE-300) INJECTION 61% COMPARISON:  07/10/2016 FINDINGS: Mild to moderate motion degradation throughout. Lower chest: Clear lung bases. Mild cardiomegaly with right coronary artery atherosclerosis. Surgical changes at the gastroesophageal junction. Hepatobiliary: Normal liver. Cholecystectomy, without biliary ductal dilatation. Pancreas: Mild to moderate pancreatic atrophy, without duct dilatation or acute inflammation. Spleen: Normal in size, without focal abnormality. Adrenals/Urinary Tract: Normal adrenal glands. Lower pole right renal collecting system stone or stones on the order of 4 mm. Normal left kidney, without hydronephrosis. Normal urinary bladder. Stomach/Bowel: Status post partial gastrectomy and  gastrojejunostomy. Colonic stool burden suggests constipation. Scattered colonic diverticula. No evidence of diverticulitis. Normal terminal ileum. Appendix grossly normal but suboptimally assessed evaluated secondary to motion. Normal small bowel. Vascular/Lymphatic: Aortic and branch vessel atherosclerosis. No abdominopelvic adenopathy. Reproductive: Mild prostatomegaly. Other: Subcutaneous thickening about the left gluteal crease, including on image 90/ series 3. Incompletely imaged. Musculoskeletal: Disc bulges at L3-4 and L2-3. IMPRESSION: 1. Mild-to-moderate motion degraded exam. 2.  Possible constipation. 3. Scattered diverticulosis without evidence of diverticulitis. 4. Right nephrolithiasis. 5. Coronary artery atherosclerosis. Aortic Atherosclerosis (ICD10-I70.0). 6. Nonspecific soft tissue thickening about the left gluteal crease. Likely the site of reported abscess drainage. This is incompletely imaged and should be correlated with physical exam. Electronically Signed   By: Abigail Miyamoto M.D.   On: 10/01/2016 17:29     Subjective: Seen and examined and talked with GI Physician and did not want further endoscopic evaluation. Denied any abdominal pain, nausea or vomiting. No further bleeding per patient. Just stated he was weak but wanted to go home.  Discharge Exam: Vitals:   10/02/16 2058 10/03/16 0539  BP: (!) 102/50 (!) 95/51  Pulse: (!) 55 66  Resp: 20 17  Temp: (!) 97.5 F (36.4 C) 98.4 F (36.9 C)   Vitals:   10/02/16 1837 10/02/16 2044 10/02/16 2058 10/03/16 0539  BP: 101/60 (!) 91/57 (!) 102/50 (!) 95/51  Pulse: (!) 58 (!) 50 (!) 55 66  Resp: 20 18 20 17   Temp: (!) 97.5 F (36.4 C) 97.6 F (36.4 C) (!) 97.5 F (36.4 C) 98.4 F (36.9 C)  TempSrc: Oral Oral Oral Oral  SpO2: 97% 95%  93%  Weight:      Height:       General: Pt is alert, awake, not in acute distress Cardiovascular: RRR, S1/S2 +, no rubs, no gallops Respiratory: CTA bilaterally, no wheezing, no  rhonchi Abdominal: Soft, NT, ND, bowel sounds + Extremities: no edema, no cyanosis  The results of significant diagnostics from this hospitalization (including imaging, microbiology, ancillary and laboratory) are listed below for reference.    Microbiology: Recent Results (from the past 240 hour(s))  Urine culture     Status: Abnormal   Collection Time: 09/28/16 12:11 AM  Result Value Ref Range Status   Specimen Description URINE, RANDOM  Final   Special Requests NONE  Final  Culture MULTIPLE ORGANISMS PRESENT, NONE PREDOMINANT (A)  Final   Report Status 09/30/2016 FINAL  Final    Labs: BNP (last 3 results) No results for input(s): BNP in the last 8760 hours. Basic Metabolic Panel:  Recent Labs Lab 09/28/16 2352 10/01/16 1402 10/02/16 1205 10/03/16 0437  NA 131* 135 136 136  K 3.9 3.7 4.1 3.5  CL 98* 101 105 103  CO2 23 24 21* 24  GLUCOSE 140* 181* 86 107*  BUN 15 14 11 12   CREATININE 0.86 0.84 0.82 0.94  CALCIUM 9.6 8.7* 8.3* 8.4*  MG  --   --  2.0 1.9  PHOS  --   --  3.2 3.1   Liver Function Tests:  Recent Labs Lab 09/28/16 2352 10/01/16 1402 10/02/16 1205 10/03/16 0437  AST 25 24 31 28   ALT 26 29 26 29   ALKPHOS 78 79 73 80  BILITOT 0.3 0.3 0.3 0.9  PROT 6.4* 6.3* 5.7* 5.7*  ALBUMIN 3.4* 3.2* 2.9* 3.0*    Recent Labs Lab 10/01/16 1402  LIPASE 28   No results for input(s): AMMONIA in the last 168 hours. CBC:  Recent Labs Lab 09/28/16 2352 10/01/16 1402  10/02/16 0424 10/02/16 1205 10/02/16 2237 10/03/16 0437 10/03/16 1203  WBC 10.8* 6.1  --  5.2  --   --  6.3  --   NEUTROABS 8.9*  --   --   --   --   --  4.8  --   HGB 8.7* 8.1*  --  7.0*  --   --  9.5*  --   HCT 29.0* 27.2*  < > 23.8* 25.6* 29.6* 31.0* 33.5*  MCV 82.2 80.5  --  81.0  --   --  79.7  --   PLT 361 347  --  335  --   --  312  --   < > = values in this interval not displayed. Cardiac Enzymes:  Recent Labs Lab 09/28/16 2352  TROPONINI <0.03   BNP: Invalid input(s):  POCBNP CBG:  Recent Labs Lab 10/02/16 2045 10/03/16 0035 10/03/16 0758 10/03/16 1148 10/03/16 1701  GLUCAP 78 94 119* 127* 162*   D-Dimer No results for input(s): DDIMER in the last 72 hours. Hgb A1c No results for input(s): HGBA1C in the last 72 hours. Lipid Profile No results for input(s): CHOL, HDL, LDLCALC, TRIG, CHOLHDL, LDLDIRECT in the last 72 hours. Thyroid function studies No results for input(s): TSH, T4TOTAL, T3FREE, THYROIDAB in the last 72 hours.  Invalid input(s): FREET3 Anemia work up No results for input(s): VITAMINB12, FOLATE, FERRITIN, TIBC, IRON, RETICCTPCT in the last 72 hours. Urinalysis    Component Value Date/Time   COLORURINE STRAW (A) 10/01/2016 1354   APPEARANCEUR CLEAR 10/01/2016 1354   LABSPEC 1.011 10/01/2016 1354   PHURINE 6.0 10/01/2016 1354   GLUCOSEU NEGATIVE 10/01/2016 1354   HGBUR NEGATIVE 10/01/2016 1354   BILIRUBINUR NEGATIVE 10/01/2016 1354   KETONESUR NEGATIVE 10/01/2016 1354   PROTEINUR NEGATIVE 10/01/2016 1354   UROBILINOGEN 0.2 10/09/2014 2315   NITRITE NEGATIVE 10/01/2016 1354   LEUKOCYTESUR NEGATIVE 10/01/2016 1354   Sepsis Labs Invalid input(s): PROCALCITONIN,  WBC,  LACTICIDVEN Microbiology Recent Results (from the past 240 hour(s))  Urine culture     Status: Abnormal   Collection Time: 09/28/16 12:11 AM  Result Value Ref Range Status   Specimen Description URINE, RANDOM  Final   Special Requests NONE  Final   Culture MULTIPLE ORGANISMS PRESENT, NONE PREDOMINANT (A)  Final   Report  Status 09/30/2016 FINAL  Final   Time coordinating discharge: 35 minutes  SIGNED:  Kerney Elbe, DO [facility] 10/03/2016, 9:16 PM Pager [phone]  If 7PM-7AM, please contact night-coverage [email or web address] Password [email or web address]

## 2016-10-03 NOTE — Care Management Note (Signed)
Case Management Note  Patient Details  Name: [name] MRN: [id number] Date of Birth: [birth year]  Subjective/Objective:                 Spoke w patient at the bedside. He states that he would like to use [facility] for home health again, referral made to [facility], clinical liaison. Patient states he has a RW at home already, but will need assistance with transportation. Notified Shelton Silvas [phone] CSW that he would like a cab voucher.    Action/Plan:  DC to home w [facility] PT RN  Expected Discharge Date:  10/03/16               Expected Discharge Plan:  [place]  In-House Referral:     Discharge planning Services  CM Consult  Post Acute Care Choice:    Choice offered to:  Patient  DME Arranged:    DME Agency:     HH Arranged:  RN, PT [place] Agency:  [place]  Status of Service:  Completed, signed off  If discussed at [place] of Stay Meetings, dates discussed:    Additional Comments:  Carles Collet, RN 10/03/2016, 3:00 PM

## 2016-10-03 NOTE — Progress Notes (Signed)
Discharge instructions printed and reviewed with patient, and copy given for them to take home. All questions addressed at this time. New prescriptions reviewed. IV's removed. Room searched for patient belongings, and confirmed with patient that all valuables were accounted for. Staff escorted patient to discharge via wheelchair, transportation home arranged through [facility] to take him to his daughter's house in [place].

## 2016-10-03 NOTE — Consult Note (Signed)
[facility] Gastroenterology Consultation Note  Referring Provider: Dr. Alfredia Ferguson [facility]) Primary Care Physician:  Bernerd Limbo, MD  Reason for Consultation:  Anemia, GI bleed  HPI: Rolfe Hartsell is a 76 y.o. male admitted with weakness and isolated episode of hematochezia.  Chronic left-lower abdominal pain for years, some constipation as well.  No melena.  Gastric surgery 50+ years ago for gastric ulcer, Bilroth-II, and has been "anemic all my life."  Takes warfarin chronically and oral iron.  Recurrent anemia issues with endoscopy x 2 and colonoscopy, all within the past 2.5 years.   Past Medical History:  Diagnosis Date  . Anemia    takes Ferrous Sulfate daily  . Arthritis    "all over"  . Atrial flutter ([place])    a. 07/2010 Status post caval tricuspid isthmus ablation by Dr. Midge Aver Metoprolol daily  . Balance problem 01/2014  . CAP (community acquired pneumonia) 09/18/2014  . Cervical radiculopathy due to degenerative joint disease of spine   . COPD (chronic obstructive pulmonary disease) ([place])   . Coronary artery disease, non-occlusive    a. 03/2010 Nonocclusive disease by cath, performed for ST elevations on ECG;  b. 06/2013 Lexi MV: EF 60%, no ischemia.  . Diabetes mellitus type II    takes Metformin and Lantus daily  . Diastolic CHF, chronic ([place])    a. 12/2012 EF 55-60%, diast dysfxn, triv MR, mildly dil LA/RA.  [name] Dysrhythmia    HX OF ATRIAL IFB /FLUTTER takes Flecanide and Coumadin daily  . History of blood transfusion 1982   "when I had stomach OR"  . History of bronchitis    1998  . History of gastric ulcer   . HTN (hypertension)    takes Diltiazem daily  . Hyperlipidemia    takes Pravastatin daily  . Joint pain   . PAF (paroxysmal atrial fibrillation) ([place])    a. Recurrent after atrial flutter, currently controlled on flecainide plus diltiazem  . Pneumonia 1999  . Weakness    numbness and tingling both hands    Past Surgical History:  Procedure Laterality Date   . ATRIAL ABLATION SURGERY  08/05/10   CTI ablation for atrial flutter by JA  . CARDIAC CATHETERIZATION  2012   nl LV function, no occlusive CAD, PAF  . CARDIOVERSION  12/07/2010    Successful direct current cardioversion with atrial fibrillation to normal sinus rhythm  . CARPAL TUNNEL RELEASE Bilateral 01/30/2014   Procedure: BILATERAL CARPAL TUNNEL RELEASE;  Surgeon: Marianna Payment, MD;  Location: [place];  Service: Orthopedics;  Laterality: Bilateral;  . CATARACT EXTRACTION W/ INTRAOCULAR LENS  IMPLANT, BILATERAL Bilateral   . COLONOSCOPY [name] 12/02/2013   Procedure: COLONOSCOPY;  Surgeon: Irene Shipper, MD;  Location: [place];  Service: Endoscopy;  Laterality: [name];  . ESOPHAGOGASTRODUODENOSCOPY [name] 09/22/2014   Procedure: ESOPHAGOGASTRODUODENOSCOPY (EGD);  Surgeon: Ronald Lobo, MD;  Location: [facility] ENDOSCOPY;  Service: Endoscopy;  Laterality: [name];  . ESOPHAGOGASTRODUODENOSCOPY [name] 07/11/2016   Procedure: ESOPHAGOGASTRODUODENOSCOPY (EGD);  Surgeon: Doran Stabler, MD;  Location: [facility] ENDOSCOPY;  Service: Endoscopy;  Laterality: [name];  . INCISION AND DRAINAGE ABSCESS / HEMATOMA OF BURSA / KNEE / THIGH Left 1998   knee  . KNEE BURSECTOMY Left 1998  . LAPAROSCOPIC CHOLECYSTECTOMY  03/2010  . NM MYOVIEW LTD  07/22/2013   Normal EF ~60%, no ischemia or infarction.  [name] PARTIAL GASTRECTOMY  1982   subtotal; "took out 30% for ulcers"  . TRANSTHORACIC ECHOCARDIOGRAM  02/16/2014   EF 60%, no RWMA. -  otherwise normal  . YAG LASER APPLICATION Bilateral     Prior to Admission medications   Medication Sig Start Date End Date Taking? Authorizing Provider  diclofenac sodium (VOLTAREN) 1 % GEL Apply 2 g topically 4 (four) times daily. 07/12/16  Yes Holley Raring, MD  diltiazem (CARDIZEM CD) 120 MG 24 hr capsule Take 1 capsule (120 mg total) by mouth daily. 03/19/15  Yes Kilroy, Luke K, PA-C  ferrous sulfate 325 (65 FE) MG tablet Take 325 mg by mouth daily with breakfast.   Yes [provider]   flecainide (TAMBOCOR) 150 MG tablet TAKE 1 AND 1/2 TABLETS TWICE DAILY Patient taking differently: TAKE 225 MG BY MOUTH TWICE DAILY 08/14/16  Yes Leonie Man, MD  Insulin Glargine (LANTUS) 100 UNIT/ML Solostar Pen Inject 8 Units into the skin at bedtime. Patient taking differently: Inject 8 Units into the skin 2 (two) times daily.  07/12/16  Yes Holley Raring, MD  loratadine (CLARITIN) 10 MG tablet Take 10 mg by mouth every evening.    Yes [provider]  metFORMIN (GLUCOPHAGE) 1000 MG tablet Take 1,000 mg by mouth 2 (two) times daily with a meal.    Yes [provider]  metoprolol succinate (TOPROL-XL) 25 MG 24 hr tablet TAKE 1/2 TABLET DAILY. (NEED MD APPT FOR FURTHER REFILLS) Patient taking differently: TAKE 1/2 TABLET (12.5 MG) DAILY. (NEED MD APPT FOR FURTHER REFILLS) 06/13/16  Yes Leonie Man, MD  Multiple Vitamin (MULTIVITAMIN WITH MINERALS) TABS tablet Take 1 tablet by mouth 2 (two) times daily.    Yes [provider]  pantoprazole (PROTONIX) 40 MG tablet Take 1 tablet (40 mg total) by mouth 2 (two) times daily. 07/12/16  Yes Holley Raring, MD  pravastatin (PRAVACHOL) 40 MG tablet TAKE 1 TABLET EVERY EVENING Patient taking differently: TAKE 40 MG BY MOUTH EVERY EVENING 09/22/16  Yes Leonie Man, MD  warfarin (COUMADIN) 5 MG tablet TAKE 1 TO 1 AND 1/2 TABLETS DAILY AS DIRECTED Patient taking differently: Take 5 mg by mouth daily on Monday, Wednesday and Friday. Take 7.5 mg by mouth daily on all other days 08/29/16  Yes Troy Sine, MD  acetaminophen (TYLENOL) 650 MG CR tablet Take 650 mg by mouth 2 (two) times daily as needed for pain.    [provider]  clindamycin (CLEOCIN) 300 MG capsule Take 1 capsule (300 mg total) by mouth 4 (four) times daily. X 7 days Patient not taking: Reported on 10/02/2016 09/23/16   Ripley Fraise, MD  cyclobenzaprine (FLEXERIL) 5 MG tablet Take 2 tablets (10 mg total) by mouth 3 (three) times daily as needed  for muscle spasms. 09/29/16   Charlann Lange, PA-C  nitroGLYCERIN (NITROSTAT) 0.4 MG SL tablet Place 1 tablet (0.4 mg total) under the tongue every 5 (five) minutes x 3 doses as needed for chest pain. 11/19/15   Erlene Quan, PA-C  traMADol (ULTRAM) 50 MG tablet Take 1 tablet (50 mg total) by mouth every 6 (six) hours as needed. Patient taking differently: Take 50 mg by mouth every 6 (six) hours as needed for moderate pain.  09/29/16   Charlann Lange, PA-C    Current Facility-Administered Medications  Medication Dose Route Frequency Provider Last Rate Last Dose  . 0.9 %  sodium chloride infusion  250 mL Intravenous PRN Merton Border, MD      . acetaminophen (TYLENOL) tablet 650 mg  650 mg Oral Q6H PRN Merton Border, MD   650 mg at 10/03/16 0930  Or  . acetaminophen (TYLENOL) suppository 650 mg  650 mg Rectal Q6H PRN Merton Border, MD      . cyclobenzaprine (FLEXERIL) tablet 10 mg  10 mg Oral TID PRN Merton Border, MD   10 mg at 10/03/16 0930  . diltiazem (CARDIZEM CD) 24 hr capsule 120 mg  120 mg Oral Daily Merton Border, MD   120 mg at 10/03/16 0931  . flecainide (TAMBOCOR) tablet 225 mg  225 mg Oral Q12H Merton Border, MD   225 mg at 10/03/16 0930  . insulin aspart (novoLOG) injection 0-9 Units  0-9 Units Subcutaneous TID WC Merton Border, MD   2 Units at 10/02/16 1805  . insulin glargine (LANTUS) injection 8 Units  8 Units Subcutaneous QHS Merton Border, MD   8 Units at 10/01/16 2259  . loratadine (CLARITIN) tablet 10 mg  10 mg Oral QPM Merton Border, MD   10 mg at 10/02/16 1759  . morphine 4 MG/ML injection 1 mg  1 mg Intravenous Q4H PRN Merton Border, MD   1 mg at 10/02/16 1431  . multivitamin with minerals tablet 1 tablet  1 tablet Oral Daily Merton Border, MD   1 tablet at 10/03/16 0930  . nitroGLYCERIN (NITROSTAT) SL tablet 0.4 mg  0.4 mg Sublingual Q5 Min x 3 PRN Merton Border, MD      . ondansetron (ZOFRAN-ODT) disintegrating tablet 4 mg  4 mg Oral Q8H PRN Gardiner Barefoot, NP   4 mg at 10/03/16 0408  .  pantoprazole (PROTONIX) injection 40 mg  40 mg Intravenous Q12H Merton Border, MD   40 mg at 10/03/16 0930  . polyethylene glycol (MIRALAX / GLYCOLAX) packet 17 g  17 g Oral Daily Merton Border, MD   17 g at 10/03/16 0930  . pravastatin (PRAVACHOL) tablet 40 mg  40 mg Oral QPM Merton Border, MD   40 mg at 10/02/16 1759  . sodium chloride flush (NS) 0.9 % injection 3 mL  3 mL Intravenous Q12H Merton Border, MD   3 mL at 10/03/16 0932  . sodium chloride flush (NS) 0.9 % injection 3 mL  3 mL Intravenous PRN Merton Border, MD      . traMADol Veatrice Bourbon) tablet 50 mg  50 mg Oral Q6H PRN Merton Border, MD   50 mg at 10/03/16 0931  . zolpidem (AMBIEN) tablet 5 mg  5 mg Oral QHS PRN Merton Border, MD   5 mg at 10/01/16 2104    Allergies as of 10/01/2016  . (No Known Allergies)    Family History  Problem Relation Age of Onset  . Cancer Mother   . Heart attack Father   . Heart attack Brother     Social History   Social History  . Marital status: Widowed    Spouse name: [name]  . Number of children: [name]  . Years of education: [name]   Occupational History  . Not on file.   Social History Main Topics  . Smoking status: Current Every Day Smoker    Packs/day: 0.50    Years: 55.00    Types: Cigarettes  . Smokeless tobacco: Never Used     Comment: He's been smoking between 0.5 and 2 ppd since age 18.  [name] Alcohol use No     Comment: "quit drinking in 1986"  . Drug use: No  . Sexual activity: No   Other Topics Concern  . Not on file   Social History Narrative   He is a widower, who recently  moved to [place] back in 2011 to live closer to his daughter. He formerly lived in [place]. He is a father 7, grandfather, and great-grandfather of 61. Unfortunately as I mentioned he's gone back to smoking. Smoking about a half pack a day now. He is not very active, but does try to get outside and walk some. He does not drink alcohol.    Review of Systems: Positive = bold Gen: Denies any fever, chills, rigors,  night sweats, anorexia, fatigue, weakness, malaise, involuntary weight loss, and sleep disorder CV: Denies chest pain, angina, palpitations, syncope, orthopnea, PND, peripheral edema, and claudication. Resp: Denies dyspnea, cough, sputum, wheezing, coughing up blood. GI: Described in detail in HPI.    GU : Denies urinary burning, blood in urine, urinary frequency, urinary hesitancy, nocturnal urination, and urinary incontinence. MS: Denies joint pain or swelling.  Denies muscle weakness, cramps, atrophy.  Derm: Denies rash, itching, oral ulcerations, hives, unhealing ulcers.  Psych: Denies depression, anxiety, memory loss, suicidal ideation, hallucinations,  and confusion. Heme: Denies bruising, bleeding, and enlarged lymph nodes. Neuro:  Denies any headaches, dizziness, paresthesias. Endo:  Denies any problems with DM, thyroid, adrenal function.  Physical Exam: Vital signs in last 24 hours: Temp:  [97.3 F (36.3 C)-98.7 F (37.1 C)] 98.4 F (36.9 C) (07/10 0539) Pulse Rate:  [50-70] 66 (07/10 0539) Resp:  [17-20] 17 (07/10 0539) BP: (91-108)/(50-60) 95/51 (07/10 0539) SpO2:  [93 %-97 %] 93 % (07/10 0539) Last BM Date: 10/01/16 General:   Alert,  Somewhat frail and cachectic-appearing, pale, NAD Head:  Normocephalic and atraumatic. Eyes:  Sclera clear, no icterus.   Conjunctiva pale Ears:  Normal auditory acuity. Nose:  No deformity, discharge,  or lesions. Mouth:  No deformity or lesions.  Oropharynx pale and dry Neck:  Supple; no masses or thyromegaly. Lungs:  Diffusely mild post-expiratory wheezes.   No crackles or rhonchi. No acute distress. Heart:  Regular rate and rhythm; no murmurs, clicks, rubs,  or gallops. Abdomen:  Soft, protuberant, mild distended, left-sided ill-defined tenderness without peritonitis. No masses, hepatosplenomegaly or hernias noted. Normal bowel sounds, without guarding, and without rebound.     Msk:  DIffusely atrophied but symmetrical without gross  deformities. Normal posture. Pulses:  Normal pulses noted. Extremities:  Without clubbing or edema. Neurologic:  Diffusely weak, otherwise alert and  oriented x4;  grossly normal neurologically. Skin:  Pale otherwise intact without significant lesions or rashes. Psych:  Alert and cooperative. Normal mood and affect.   Lab Results:  Recent Labs  10/01/16 1402  10/02/16 0424 10/02/16 1205 10/02/16 2237 10/03/16 0437  WBC 6.1  --  5.2  --   --  6.3  HGB 8.1*  --  7.0*  --   --  9.5*  HCT 27.2*  < > 23.8* 25.6* 29.6* 31.0*  PLT 347  --  335  --   --  312  < > = values in this interval not displayed. BMET  Recent Labs  10/01/16 1402 10/02/16 1205 10/03/16 0437  NA 135 136 136  K 3.7 4.1 3.5  CL 101 105 103  CO2 24 21* 24  GLUCOSE 181* 86 107*  BUN 14 11 12   CREATININE 0.84 0.82 0.94  CALCIUM 8.7* 8.3* 8.4*   LFT  Recent Labs  10/03/16 0437  PROT 5.7*  ALBUMIN 3.0*  AST 28  ALT 29  ALKPHOS 80  BILITOT 0.9   PT/INR  Recent Labs  10/01/16 1929 10/03/16 0437  LABPROT 27.7* 21.0*  INR 2.52 1.79    Studies/Results: Ct Abdomen Pelvis W Contrast  Result Date: 10/01/2016 CLINICAL DATA:  Intermittent bloody stools over the past year. Left lower quadrant pain. Decreased hemoglobin. Recent left buttock abscess drainage. Partial gastrectomy secondary to chronic blood loss. Rule out diverticulitis or diverticulosis. EXAM: CT ABDOMEN AND PELVIS WITH CONTRAST TECHNIQUE: Multidetector CT imaging of the abdomen and pelvis was performed using the standard protocol following bolus administration of intravenous contrast. CONTRAST:  137mL ISOVUE-300 IOPAMIDOL (ISOVUE-300) INJECTION 61% COMPARISON:  07/10/2016 FINDINGS: Mild to moderate motion degradation throughout. Lower chest: Clear lung bases. Mild cardiomegaly with right coronary artery atherosclerosis. Surgical changes at the gastroesophageal junction. Hepatobiliary: Normal liver. Cholecystectomy, without biliary ductal  dilatation. Pancreas: Mild to moderate pancreatic atrophy, without duct dilatation or acute inflammation. Spleen: Normal in size, without focal abnormality. Adrenals/Urinary Tract: Normal adrenal glands. Lower pole right renal collecting system stone or stones on the order of 4 mm. Normal left kidney, without hydronephrosis. Normal urinary bladder. Stomach/Bowel: Status post partial gastrectomy and gastrojejunostomy. Colonic stool burden suggests constipation. Scattered colonic diverticula. No evidence of diverticulitis. Normal terminal ileum. Appendix grossly normal but suboptimally assessed evaluated secondary to motion. Normal small bowel. Vascular/Lymphatic: Aortic and branch vessel atherosclerosis. No abdominopelvic adenopathy. Reproductive: Mild prostatomegaly. Other: Subcutaneous thickening about the left gluteal crease, including on image 90/ series 3. Incompletely imaged. Musculoskeletal: Disc bulges at L3-4 and L2-3. IMPRESSION: 1. Mild-to-moderate motion degraded exam. 2.  Possible constipation. 3. Scattered diverticulosis without evidence of diverticulitis. 4. Right nephrolithiasis. 5. Coronary artery atherosclerosis. Aortic Atherosclerosis (ICD10-I70.0). 6. Nonspecific soft tissue thickening about the left gluteal crease. Likely the site of reported abscess drainage. This is incompletely imaged and should be correlated with physical exam. Electronically Signed   By: Abigail Miyamoto M.D.   On: 10/01/2016 17:29   Impression:  1.  Hematochezia, with constipation, isolated, scant resolved, known history of hemorrhoids.  Suspect hemorrhoidal.  Could have component of intermittent ischemic colitis but recent CT showed no colonic thickening. 2.  Anemia.  Ongoing for years.  Suspect most likely from gastric surgery, possible contribution from gastrojejunal anastomotic friability as well.  Two endoscopies and colonoscopy for the GI bleeding and anemia within the past 2.5 years. 3.  Abdominal pain, left-sided,  chronic for years, suspect possible constipation component. 4.  Chronic anticoagulation, warfarin.  Plan:  1.  Patient does not want any further endoscopic evaluation, and I don't really think it will be all that helpful; he's had two endoscopies, last April 2018, and one colonoscopy, September 2015, for these exact issues. 2.  Stay on PPI indefinitely.  No NSAIDs indefinitely. 3.  Patient has been on oral iron, but I suspect post-gastric surgery there is component of malabsorption, thus maintenance IV iron long-term is likely best. 4.  So long as patient stays on anticoagulation, I would imagine he would continue to have these issues, as there is not a readily "fixable" endoscopic solution to his anemia and bleeding (only thing that would be of potential benefit is surgical revision of his gastrojejunostomy, but doubt this is very feasible given his age and comorbidities), thus would weigh the risk:benefit of continuing warfarin in the longterm. 5.  Minimize use of narcotics, which could enhance his constipation and then his pain. 6.  Medical therapy for constipation. 7.  Smoking cessation may help with healing of any potential GI ulcer issues. 8.  Advance diet as tolerated. 9.  [facility] will follow.   LOS: 2 days   Landry Dyke  10/03/2016, 10:14 AM  Pager [phone] If no answer or after 5 PM call [phone]

## 2016-10-03 NOTE — Evaluation (Signed)
Physical Therapy Evaluation Patient Details Name: [name] MRN: [id number] DOB: [birth year] Today's Date: 10/03/2016   History of Present Illness  Pt is a 76 y/o male admitted secondary to progressive weakness and hematochezia, as well as, abdominal pain. Per GI notes, likely hemrrhoidal in nature.   Clinical Impression  Pt is admitted secondary to problem above with deficits below. PTA, pt was ambulating with cane secondary to back pain. Upon evaluation, pt unsteady with ambulation with cane, and demonstrated improper use of cane. Pt also with decreased strength and safety awareness. Required min A to prevent LOB during mobility, and limited tolerance secondary to back pain. Pt requesting to sit after gait, and unable to attempt stair navigation today secondary to back pain. Pt will need to perform stair navigation to ensure safety before return home. Spoke with mobility tech and discussed her seeing pt to increase mobility. Pt wanting to go home so recommending follow up below. Will continue to follow acutely to maximize functional mobility independence.     Follow Up Recommendations Home health PT;Supervision for mobility/OOB    Equipment Recommendations  Rolling walker with 5" wheels    Recommendations for Other Services       Precautions / Restrictions Precautions Precautions: Fall Restrictions Weight Bearing Restrictions: No      Mobility  Bed Mobility Overal bed mobility: Independent             General bed mobility comments: no assist required   Transfers Overall transfer level: Needs assistance Equipment used:  (hurricane ) Transfers: Sit to/from Stand Sit to Stand: Min guard         General transfer comment: Min guard for safety.   Ambulation/Gait Ambulation/Gait assistance: Min assist Ambulation Distance (Feet): 200 Feet Assistive device:  (hurricane ) Gait Pattern/deviations: Step-through pattern;Decreased stride length;Shuffle;Antalgic;Decreased  dorsiflexion - right;Decreased dorsiflexion - left Gait velocity: Decreased Gait velocity interpretation: Below normal speed for age/gender General Gait Details: Slow, unsteady gait with use of cane, especially when turning. Demonstrated LOB X 3 during gait requiring min A to maintain balance. Educated to use RW at home to increase safety with mobility. Verbal cues throughout for appropriate use of cane, as pt was leaving cane behind. Verbal cues for increasing step height. Pt with increased instability at end of gait training secondary to back pain, and requesting to sit.   Stairs            Wheelchair Mobility    Modified Rankin (Stroke Patients Only)       Balance Overall balance assessment: Needs assistance Sitting-balance support: No upper extremity supported;Feet supported Sitting balance-Leahy Scale: Good     Standing balance support: Single extremity supported;During functional activity Standing balance-Leahy Scale: Poor Standing balance comment: Reliant on UE support and external support to maintain balance.                              Pertinent Vitals/Pain Pain Assessment: 0-10 Pain Score: 8  Pain Location: back; L abdomen  Pain Descriptors / Indicators: Sore;Aching Pain Intervention(s): Limited activity within patient's tolerance;Monitored during session;Repositioned    Home Living Family/patient expects to be discharged to:: Private residence Living Arrangements: Children Available Help at Discharge: Family;Available PRN/intermittently Type of Home: Mobile home Home Access: Stairs to enter Entrance Stairs-Rails: Right;Left Entrance Stairs-Number of Steps: 3 Home Layout: One level Home Equipment: Cane - single point;Walker - 4 wheels;Shower seat      Prior Function Level of Independence: Independent  with assistive device(s)         Comments: Uses a cane secondary to pain in back      Hand Dominance   Dominant Hand: Right     Extremity/Trunk Assessment   Upper Extremity Assessment Upper Extremity Assessment: Defer to OT evaluation    Lower Extremity Assessment Lower Extremity Assessment: Generalized weakness (grossly 4-/5 throughout )    Cervical / Trunk Assessment Cervical / Trunk Assessment: Kyphotic  Communication   Communication: HOH  Cognition Arousal/Alertness: Awake/alert Behavior During Therapy: WFL for tasks assessed/performed Overall Cognitive Status: No family/caregiver present to determine baseline cognitive functioning                                 General Comments: Decreased safety awareness noted. Multiple cues required to wait for PT and for proper use of DME.       General Comments General comments (skin integrity, edema, etc.): Educated about use of RW at home to increase stability. Educated to [employer] throughout stay with assist; notified mobility tech to add to her list of patients to increase mobility.     Exercises     Assessment/Plan    PT Assessment Patient needs continued PT services  PT Problem List Decreased strength;Decreased range of motion;Decreased activity tolerance;Decreased balance;Decreased mobility;Decreased knowledge of use of DME;Decreased safety awareness;Decreased knowledge of precautions;Pain       PT Treatment Interventions DME instruction;Gait training;Stair training;Functional mobility training;Therapeutic activities;Balance training;Therapeutic exercise;Neuromuscular re-education;Patient/family education    PT Goals (Current goals can be found in the Care Plan section)  Acute Rehab PT Goals Patient Stated Goal: to go home  PT Goal Formulation: With patient Time For Goal Achievement: 10/17/16 Potential to Achieve Goals: Good    Frequency Min 3X/week   Barriers to discharge        Co-evaluation               AM-PAC PT "6 Clicks" Daily Activity  Outcome Measure Difficulty turning over in bed (including adjusting  bedclothes, sheets and blankets)?: None Difficulty moving from lying on back to sitting on the side of the bed? : None Difficulty sitting down on and standing up from a chair with arms (e.g., wheelchair, bedside commode, etc,.)?: Total Help needed moving to and from a bed to chair (including a wheelchair)?: A Little Help needed walking in hospital room?: A Little Help needed climbing 3-5 steps with a railing? : A Lot 6 Click Score: 17    End of Session Equipment Utilized During Treatment: Gait belt Activity Tolerance: Patient tolerated treatment well Patient left: in bed;with call bell/phone within reach Nurse Communication: Mobility status PT Visit Diagnosis: Unsteadiness on feet (R26.81);Pain Pain - part of body:  (back )    Time: 1253-1316 PT Time Calculation (min) (ACUTE ONLY): 23 min   Charges:   PT Evaluation $PT Eval Low Complexity: 1 Procedure PT Treatments $Gait Training: 8-22 mins   PT G Codes:        Leighton Ruff, PT, DPT  Acute Rehabilitation Services  Pager: [phone]   Rudean Hitt 10/03/2016, 2:19 PM

## 2016-10-04 ENCOUNTER — Other Ambulatory Visit: Payer: Self-pay | Admitting: Cardiology

## 2016-10-04 DIAGNOSIS — M150 Primary generalized (osteo)arthritis: Secondary | ICD-10-CM | POA: Diagnosis not present

## 2016-10-04 DIAGNOSIS — M4722 Other spondylosis with radiculopathy, cervical region: Secondary | ICD-10-CM | POA: Diagnosis not present

## 2016-10-05 DIAGNOSIS — M150 Primary generalized (osteo)arthritis: Secondary | ICD-10-CM | POA: Diagnosis not present

## 2016-10-05 DIAGNOSIS — M4722 Other spondylosis with radiculopathy, cervical region: Secondary | ICD-10-CM | POA: Diagnosis not present

## 2016-10-06 ENCOUNTER — Ambulatory Visit (INDEPENDENT_AMBULATORY_CARE_PROVIDER_SITE_OTHER): Payer: Medicare Other | Admitting: Pharmacist

## 2016-10-06 DIAGNOSIS — M542 Cervicalgia: Secondary | ICD-10-CM | POA: Diagnosis not present

## 2016-10-06 DIAGNOSIS — K5903 Drug induced constipation: Secondary | ICD-10-CM | POA: Diagnosis not present

## 2016-10-06 DIAGNOSIS — M4722 Other spondylosis with radiculopathy, cervical region: Secondary | ICD-10-CM | POA: Diagnosis not present

## 2016-10-06 DIAGNOSIS — M150 Primary generalized (osteo)arthritis: Secondary | ICD-10-CM | POA: Diagnosis not present

## 2016-10-06 DIAGNOSIS — Z7901 Long term (current) use of anticoagulants: Secondary | ICD-10-CM | POA: Diagnosis not present

## 2016-10-06 DIAGNOSIS — Z794 Long term (current) use of insulin: Secondary | ICD-10-CM | POA: Diagnosis not present

## 2016-10-06 DIAGNOSIS — I480 Paroxysmal atrial fibrillation: Secondary | ICD-10-CM

## 2016-10-06 DIAGNOSIS — D500 Iron deficiency anemia secondary to blood loss (chronic): Secondary | ICD-10-CM | POA: Diagnosis not present

## 2016-10-06 DIAGNOSIS — D508 Other iron deficiency anemias: Secondary | ICD-10-CM | POA: Diagnosis not present

## 2016-10-06 DIAGNOSIS — R54 Age-related physical debility: Secondary | ICD-10-CM | POA: Diagnosis not present

## 2016-10-06 DIAGNOSIS — I5032 Chronic diastolic (congestive) heart failure: Secondary | ICD-10-CM | POA: Diagnosis not present

## 2016-10-06 DIAGNOSIS — E119 Type 2 diabetes mellitus without complications: Secondary | ICD-10-CM | POA: Diagnosis not present

## 2016-10-06 DIAGNOSIS — I4892 Unspecified atrial flutter: Secondary | ICD-10-CM | POA: Diagnosis not present

## 2016-10-06 DIAGNOSIS — Z09 Encounter for follow-up examination after completed treatment for conditions other than malignant neoplasm: Secondary | ICD-10-CM | POA: Diagnosis not present

## 2016-10-06 LAB — PROTIME-INR: INR: 1.9 — AB (ref <=1.1)

## 2016-10-10 DIAGNOSIS — M4722 Other spondylosis with radiculopathy, cervical region: Secondary | ICD-10-CM | POA: Diagnosis not present

## 2016-10-10 DIAGNOSIS — M150 Primary generalized (osteo)arthritis: Secondary | ICD-10-CM | POA: Diagnosis not present

## 2016-10-12 DIAGNOSIS — M150 Primary generalized (osteo)arthritis: Secondary | ICD-10-CM | POA: Diagnosis not present

## 2016-10-12 DIAGNOSIS — M4722 Other spondylosis with radiculopathy, cervical region: Secondary | ICD-10-CM | POA: Diagnosis not present

## 2016-10-12 NOTE — Addendum Note (Signed)
Addendum  created 10/12/16 1745 by Annye Asa, MD   Sign clinical note

## 2016-10-13 ENCOUNTER — Ambulatory Visit (INDEPENDENT_AMBULATORY_CARE_PROVIDER_SITE_OTHER): Payer: Medicare Other | Admitting: Pharmacist

## 2016-10-13 DIAGNOSIS — M4722 Other spondylosis with radiculopathy, cervical region: Secondary | ICD-10-CM | POA: Diagnosis not present

## 2016-10-13 DIAGNOSIS — Z7901 Long term (current) use of anticoagulants: Secondary | ICD-10-CM | POA: Diagnosis not present

## 2016-10-13 DIAGNOSIS — M150 Primary generalized (osteo)arthritis: Secondary | ICD-10-CM | POA: Diagnosis not present

## 2016-10-13 DIAGNOSIS — I480 Paroxysmal atrial fibrillation: Secondary | ICD-10-CM

## 2016-10-13 DIAGNOSIS — D649 Anemia, unspecified: Secondary | ICD-10-CM | POA: Diagnosis not present

## 2016-10-13 LAB — PROTIME-INR: INR: 2.4 — AB (ref <=1.1)

## 2016-10-17 DIAGNOSIS — M150 Primary generalized (osteo)arthritis: Secondary | ICD-10-CM | POA: Diagnosis not present

## 2016-10-17 DIAGNOSIS — M4722 Other spondylosis with radiculopathy, cervical region: Secondary | ICD-10-CM | POA: Diagnosis not present

## 2016-10-19 ENCOUNTER — Ambulatory Visit (INDEPENDENT_AMBULATORY_CARE_PROVIDER_SITE_OTHER): Payer: Medicare Other | Admitting: Pharmacist

## 2016-10-19 ENCOUNTER — Other Ambulatory Visit: Payer: Self-pay | Admitting: Family

## 2016-10-19 DIAGNOSIS — D649 Anemia, unspecified: Secondary | ICD-10-CM

## 2016-10-19 DIAGNOSIS — I480 Paroxysmal atrial fibrillation: Secondary | ICD-10-CM

## 2016-10-19 DIAGNOSIS — M4722 Other spondylosis with radiculopathy, cervical region: Secondary | ICD-10-CM | POA: Diagnosis not present

## 2016-10-19 DIAGNOSIS — M150 Primary generalized (osteo)arthritis: Secondary | ICD-10-CM | POA: Diagnosis not present

## 2016-10-19 LAB — PROTIME-INR: INR: 2.4 — AB (ref <=1.1)

## 2016-10-20 ENCOUNTER — Ambulatory Visit (HOSPITAL_BASED_OUTPATIENT_CLINIC_OR_DEPARTMENT_OTHER): Payer: Medicare Other | Admitting: Family

## 2016-10-20 ENCOUNTER — Ambulatory Visit: Payer: Medicare Other

## 2016-10-20 ENCOUNTER — Other Ambulatory Visit (HOSPITAL_BASED_OUTPATIENT_CLINIC_OR_DEPARTMENT_OTHER): Payer: Medicare Other

## 2016-10-20 VITALS — BP 119/58 | HR 100 | Temp 98.1°F | Resp 19 | Ht 66.0 in | Wt 170.0 lb

## 2016-10-20 DIAGNOSIS — D649 Anemia, unspecified: Secondary | ICD-10-CM

## 2016-10-20 DIAGNOSIS — D500 Iron deficiency anemia secondary to blood loss (chronic): Secondary | ICD-10-CM

## 2016-10-20 DIAGNOSIS — I4891 Unspecified atrial fibrillation: Secondary | ICD-10-CM

## 2016-10-20 DIAGNOSIS — Z720 Tobacco use: Secondary | ICD-10-CM | POA: Diagnosis not present

## 2016-10-20 DIAGNOSIS — E119 Type 2 diabetes mellitus without complications: Secondary | ICD-10-CM

## 2016-10-20 DIAGNOSIS — Z809 Family history of malignant neoplasm, unspecified: Secondary | ICD-10-CM | POA: Diagnosis not present

## 2016-10-20 LAB — CBC WITH DIFFERENTIAL (CANCER CENTER ONLY)
BASO#: 0.1 10*3/uL (ref 0.0–0.2)
BASO%: 0.7 % (ref 0.0–2.0)
EOS ABS: 0.1 10*3/uL (ref 0.0–0.5)
EOS%: 1 % (ref 0.0–7.0)
HEMATOCRIT: 26.7 % — AB (ref 38.7–49.9)
HEMOGLOBIN: 8.1 g/dL — AB (ref 13.0–17.1)
LYMPH#: 0.9 10*3/uL (ref 0.9–3.3)
LYMPH%: 12.3 % — ABNORMAL LOW (ref 14.0–48.0)
MCH: 24.5 pg — AB (ref 28.0–33.4)
MCHC: 30.3 g/dL — AB (ref 32.0–35.9)
MCV: 81 fL — ABNORMAL LOW (ref 82–98)
MONO#: 0.9 10*3/uL (ref 0.1–0.9)
MONO%: 12.4 % (ref 0.0–13.0)
NEUT#: 5.3 10*3/uL (ref 1.5–6.5)
NEUT%: 73.6 % (ref 40.0–80.0)
Platelets: 450 10*3/uL — ABNORMAL HIGH (ref 145–400)
RBC: 3.31 10*6/uL — ABNORMAL LOW (ref 4.20–5.70)
RDW: 17.3 % — ABNORMAL HIGH (ref 11.1–15.7)
WBC: 7.2 10*3/uL (ref 4.0–10.0)

## 2016-10-20 LAB — FERRITIN: FERRITIN: 6 ng/mL — AB (ref 22–316)

## 2016-10-20 LAB — COMPREHENSIVE METABOLIC PANEL
ALBUMIN: 3.2 g/dL — AB (ref 3.5–5.0)
ALK PHOS: 85 U/L (ref 40–150)
ALT: 42 U/L (ref 0–55)
ANION GAP: 10 meq/L (ref 3–11)
AST: 33 U/L (ref 5–34)
BUN: 15.2 mg/dL (ref 7.0–26.0)
CHLORIDE: 103 meq/L (ref 98–109)
CO2: 26 mEq/L (ref 22–29)
Calcium: 9.6 mg/dL (ref 8.4–10.4)
Creatinine: 0.8 mg/dL (ref 0.7–1.3)
EGFR: 86 mL/min/{1.73_m2} — AB (ref >90)
Glucose: 101 mg/dl (ref 70–140)
POTASSIUM: 4 meq/L (ref 3.5–5.1)
Sodium: 139 mEq/L (ref 136–145)
Total Bilirubin: <0.22 mg/dL (ref 0.20–1.20)
Total Protein: 6.5 g/dL (ref 6.4–8.3)

## 2016-10-20 LAB — CHCC SATELLITE - SMEAR

## 2016-10-20 LAB — IRON AND TIBC
%SAT: 3 % — ABNORMAL LOW (ref 20–55)
IRON: 14 ug/dL — AB (ref 42–163)
TIBC: 401 ug/dL (ref 202–409)
UIBC: 388 ug/dL — ABNORMAL HIGH (ref 117–376)

## 2016-10-20 LAB — LACTATE DEHYDROGENASE: LDH: 151 U/L (ref 125–245)

## 2016-10-20 NOTE — Progress Notes (Signed)
Hematology/Oncology Consultation   Name: [name]      MRN: [id number]    Location: Room/bed info not found  Date: 10/20/2016 Time:11:44 AM   REFERRING PHYSICIAN: Bernerd Limbo, MD  REASON FOR CONSULT: Iron deficiency anemia secondary to chronic blood loss    DIAGNOSIS: Iron deficiency anemia secondary to chronic GI blood loss   HISTORY OF PRESENT ILLNESS: [name] is a very pleasant 76 yo caucasian gentleman with anemia secondary to chronic GI blood loss. Hgb at this time is 8.1 with an MCV of 81.  He is symptomatic with fatigue at times.  He has been on Coumadin for atrial fib/flutter for the past 8 years. He states that his INR yesterday was 2.4.  He has history of gastric ulcer s/p gastrectomy. He was hospitalized earlier this month with anemia (Hgb 7.0) and his stool was positive for blood. He received 2 units of blood during admission. He is now on Protonix 40 mg PO daily.  He states that Dr. Paulita Fujita does not want to make any other intervention at this time.  No fever, chills, n/v, rash, dizziness, SOB, chest pain, palpitations or abdominal pain.  He has constipation and takes Mirilax as well as Senokot as needed.  He smokes 1/2 ppd. He has chronic bronchitis and uses his inhaler as needed.  He has noticed delayed emptying when he urinates and plans on following up with his PCP, Dr. Coletta Memos, regarding this.  No swelling or tenderness in his extremities. He has numbness and tingling in his hands that comes and goes due to carpal tunnel syndrome.  He uses a cane when ambulating. He denies having had any falls or syncopal episodes.  He has maintained a good appetite and follows a heart healthy low sodium diet. He is staying well hydrated. His weight is stable.  He is diabetic and states that his blood sugars are well controlled.  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 He loves to cook.   ROS: All other 10 point review of systems is negative.   PAST MEDICAL  HISTORY:   Past Medical History:  Diagnosis Date  . Anemia    takes Ferrous Sulfate daily  . Arthritis    "all over"  . Atrial flutter ([place])    a. 07/2010 Status post caval tricuspid isthmus ablation by Dr. Midge Aver Metoprolol daily  . Balance problem 01/2014  . CAP (community acquired pneumonia) 09/18/2014  . Cervical radiculopathy due to degenerative joint disease of spine   . COPD (chronic obstructive pulmonary disease) ([place])   . Coronary artery disease, non-occlusive    a. 03/2010 Nonocclusive disease by cath, performed for ST elevations on ECG;  b. 06/2013 Lexi MV: EF 60%, no ischemia.  . Diabetes mellitus type II    takes Metformin and Lantus daily  . Diastolic CHF, chronic ([place])    a. 12/2012 EF 55-60%, diast dysfxn, triv MR, mildly dil LA/RA.  [name] Dysrhythmia    HX OF ATRIAL IFB /FLUTTER takes Flecanide and Coumadin daily  . History of blood transfusion 1982   "when I had stomach OR"  . History of bronchitis    1998  . History of gastric ulcer   . HTN (hypertension)    takes Diltiazem daily  . Hyperlipidemia    takes Pravastatin daily  . Joint pain   . PAF (paroxysmal atrial fibrillation) ([place])    a. Recurrent after atrial flutter, currently controlled on flecainide plus diltiazem  . Pneumonia 1999  .  Weakness    numbness and tingling both hands    ALLERGIES: No Known Allergies    MEDICATIONS:  Current Outpatient Prescriptions on File Prior to Visit  Medication Sig Dispense Refill  . acetaminophen (TYLENOL) 650 MG CR tablet Take 650 mg by mouth 2 (two) times daily as needed for pain.    . cyclobenzaprine (FLEXERIL) 5 MG tablet Take 2 tablets (10 mg total) by mouth 3 (three) times daily as needed for muscle spasms. 15 tablet 0  . diltiazem (CARDIZEM CD) 120 MG 24 hr capsule Take 1 capsule (120 mg total) by mouth daily. 90 capsule 3  . ferrous sulfate 325 (65 FE) MG tablet Take 325 mg by mouth daily with breakfast.    . flecainide (TAMBOCOR) 150 MG tablet TAKE 1  AND 1/2 TABLETS TWICE DAILY (Patient taking differently: TAKE 225 MG BY MOUTH TWICE DAILY) 270 tablet 3  . Insulin Glargine (LANTUS) 100 UNIT/ML Solostar Pen Inject 8 Units into the skin at bedtime. (Patient taking differently: Inject 8 Units into the skin 2 (two) times daily. ) 15 mL 11  . loratadine (CLARITIN) 10 MG tablet Take 10 mg by mouth every evening.     . metFORMIN (GLUCOPHAGE) 1000 MG tablet Take 1,000 mg by mouth 2 (two) times daily with a meal.     . metoprolol succinate (TOPROL-XL) 25 MG 24 hr tablet TAKE 1/2 TABLET  (12.5 MG TOTAL) BY MOUTH DAILY. 15 tablet 0  . Multiple Vitamin (MULTIVITAMIN WITH MINERALS) TABS tablet Take 1 tablet by mouth 2 (two) times daily.     . nitroGLYCERIN (NITROSTAT) 0.4 MG SL tablet Place 1 tablet (0.4 mg total) under the tongue every 5 (five) minutes x 3 doses as needed for chest pain. 25 tablet 9  . polyethylene glycol (MIRALAX / GLYCOLAX) packet Take 17 g by mouth 2 (two) times daily. 14 each 0  . pravastatin (PRAVACHOL) 40 MG tablet TAKE 1 TABLET EVERY EVENING (Patient taking differently: TAKE 40 MG BY MOUTH EVERY EVENING) 90 tablet 1  . senna-docusate (SENOKOT-S) 8.6-50 MG tablet Take 1 tablet by mouth 2 (two) times daily. 60 tablet 0  . traMADol (ULTRAM) 50 MG tablet Take 1 tablet (50 mg total) by mouth every 6 (six) hours as needed. (Patient taking differently: Take 50 mg by mouth every 6 (six) hours as needed for moderate pain. ) 15 tablet 0  . warfarin (COUMADIN) 5 MG tablet TAKE 1 TO 1 AND 1/2 TABLETS DAILY AS DIRECTED (Patient taking differently: Take 5 mg by mouth daily on Monday, Wednesday and Friday. Take 7.5 mg by mouth daily on all other days) 135 tablet 1   No current facility-administered medications on file prior to visit.      PAST SURGICAL HISTORY Past Surgical History:  Procedure Laterality Date  . ATRIAL ABLATION SURGERY  08/05/10   CTI ablation for atrial flutter by [facility]  . CARDIAC CATHETERIZATION  2012   nl LV function, no  occlusive CAD, PAF  . CARDIOVERSION  12/07/2010    Successful direct current cardioversion with atrial fibrillation to normal sinus rhythm  . CARPAL TUNNEL RELEASE Bilateral 01/30/2014   Procedure: BILATERAL CARPAL TUNNEL RELEASE;  Surgeon: Marianna Payment, MD;  Location: [facility];  Service: Orthopedics;  Laterality: Bilateral;  . CATARACT EXTRACTION W/ INTRAOCULAR LENS  IMPLANT, BILATERAL Bilateral   . COLONOSCOPY [name] 12/02/2013   Procedure: COLONOSCOPY;  Surgeon: Irene Shipper, MD;  Location: [facility];  Service: Endoscopy;  Laterality: [name];  . ESOPHAGOGASTRODUODENOSCOPY [name]  09/22/2014   Procedure: ESOPHAGOGASTRODUODENOSCOPY (EGD);  Surgeon: Ronald Lobo, MD;  Location[facility] ENDOSCOPY;  Service: Endoscopy;  Laterality: [name];  . ESOPHAGOGASTRODUODENOSCOPY [name] 07/11/2016   Procedure: ESOPHAGOGASTRODUODENOSCOPY (EGD);  Surgeon: Doran Stabler, MD;  Location: [facility] ENDOSCOPY;  Service: Endoscopy;  Laterality: [name];  . INCISION AND DRAINAGE ABSCESS / HEMATOMA OF BURSA / KNEE / THIGH Left 1998   knee  . KNEE BURSECTOMY Left 1998  . LAPAROSCOPIC CHOLECYSTECTOMY  03/2010  . NM MYOVIEW LTD  07/22/2013   Normal EF ~60%, no ischemia or infarction.  [name] PARTIAL GASTRECTOMY  1982   subtotal; "took out 30% for ulcers"  . TRANSTHORACIC ECHOCARDIOGRAM  02/16/2014   EF 60%, no RWMA. - otherwise normal  . YAG LASER APPLICATION Bilateral     FAMILY HISTORY: Family History  Problem Relation Age of Onset  . Cancer Mother   . Heart attack Father   . Heart attack Brother     SOCIAL HISTORY:  reports that he has been smoking Cigarettes.  He has a 27.50 pack-year smoking history. He has never used smokeless tobacco. He reports that he does not drink alcohol or use drugs.  PERFORMANCE STATUS: The patient's performance status is 1 - Symptomatic but completely ambulatory  PHYSICAL EXAM: Most Recent Vital Signs: Blood pressure (!) 119/58, pulse 100, temperature 98.1 F (36.7 C), temperature source Oral, resp.  rate 19, height 5\' 6"  (1.676 m), weight 170 lb (77.1 kg), SpO2 97 %. BP (!) 119/58 (BP Location: Left Arm, Patient Position: Sitting)   Pulse 100   Temp 98.1 F (36.7 C) (Oral)   Resp 19   Ht 5\' 6"  (1.676 m)   Wt 170 lb (77.1 kg)   SpO2 97%   BMI 27.44 kg/m   General Appearance:    Alert, cooperative, no distress, appears stated age  Head:    Normocephalic, without obvious abnormality, atraumatic  Eyes:    PERRL, conjunctiva/corneas clear, EOM's intact, fundi    benign, both eyes             Throat:   Lips, mucosa, and tongue normal; teeth and gums normal  Neck:   Supple, symmetrical, trachea midline, no adenopathy;       thyroid:  No enlargement/tenderness/nodules; no carotid   bruit or JVD  Back:     Symmetric, no curvature, ROM normal, no CVA tenderness  Lungs:     Clear to auscultation bilaterally, respirations unlabored  Chest wall:    No tenderness or deformity  Heart:    Regular rate and rhythm, S1 and S2 normal, no murmur, rub   or gallop  Abdomen:     Soft, non-tender, bowel sounds active all four quadrants,    no masses, no organomegaly         Extremities:   Extremities normal, atraumatic, no cyanosis or edema  Pulses:   2+ and symmetric all extremities  Skin:   Skin color, texture, turgor normal, no rashes or lesions  Lymph nodes:   Cervical, supraclavicular, and axillary nodes normal  Neurologic:   CNII-XII intact. Normal strength, sensation and reflexes      throughout    LABORATORY DATA:  Results for orders placed or performed in visit on 10/20/16 (from the past 48 hour(s))  CBC w/Diff     Status: Abnormal   Collection Time: 10/20/16 11:10 AM  Result Value Ref Range   WBC 7.2 4.0 - 10.0 10e3/uL   RBC 3.31 (L) 4.20 - 5.70 10e6/uL   HGB 8.1 (  L) 13.0 - 17.1 g/dL   HCT 26.7 (L) 38.7 - 49.9 %   MCV 81 (L) 82 - 98 fL   MCH 24.5 (L) 28.0 - 33.4 pg   MCHC 30.3 (L) 32.0 - 35.9 g/dL   RDW 17.3 (H) 11.1 - 15.7 %   Platelets 450 (H) 145 - 400 10e3/uL   NEUT#  5.3 1.5 - 6.5 10e3/uL   LYMPH# 0.9 0.9 - 3.3 10e3/uL   MONO# 0.9 0.1 - 0.9 10e3/uL   Eosinophils Absolute 0.1 0.0 - 0.5 10e3/uL   BASO# 0.1 0.0 - 0.2 10e3/uL   NEUT% 73.6 40.0 - 80.0 %   LYMPH% 12.3 (L) 14.0 - 48.0 %   MONO% 12.4 0.0 - 13.0 %   EOS% 1.0 0.0 - 7.0 %   BASO% 0.7 0.0 - 2.0 %  Smear     Status: None   Collection Time: 10/20/16 11:10 AM  Result Value Ref Range   Smear Result Smear Available       RADIOGRAPHY: No results found.     PATHOLOGY: None  ASSESSMENT/PLAN: Mr. Portlock is a very pleasant 76 yo caucasian gentleman with anemia secondary to chronic GI blood loss. He is symptomatic at this time with fatigue. His Hgb is 8.1 and MCV of 81. Iron saturation is 3% and with a ferritin of 6.  We will give him IV iron next week and second dose 7 days later.  We will plan to see him back again in 6 weeks for repeat lab work and follow-up.   All questions were answered. Both he and his family know to contact our office with any questions or concerns. We can certainly see him much sooner if necessary.  He was discussed with and also seen by Dr. Marin Olp and he is in agreement with the aforementioned.   [facility]     Addendum:  I saw and examined the patient with Sarah. I reviewed his blood smear under the microscope. He definitely has some microcytic red blood cells. I saw no nucleated red blood cells. There were no teardrop cells. I saw no rouleau formation.  I suspect this is iron deficiency from bleeding. He is on Coumadin.  Given the fact that he also is a diabetic may make his erythropoietin levels low. In fact, his erythropoietin level was 400. Iron this is a good indicator that he should respond very nicely to IV iron.  He is very nice. He comes in with his wife. We have a nice talk. We reassured him. I think that he should do very well with IV iron. I would not think that we would have to give him a blood transfusion.   We will plan to see him back in  another 6 weeks.  We spent about 40-45 minutes with him.  Lattie Haw, MD

## 2016-10-21 LAB — ERYTHROPOIETIN: ERYTHROPOIETIN: 395.4 m[IU]/mL — AB (ref 2.6–18.5)

## 2016-10-21 LAB — RETICULOCYTES: RETICULOCYTE COUNT: 3.3 % — AB (ref 0.6–2.6)

## 2016-10-22 ENCOUNTER — Other Ambulatory Visit: Payer: Self-pay

## 2016-10-22 ENCOUNTER — Encounter (HOSPITAL_COMMUNITY): Payer: Self-pay | Admitting: Emergency Medicine

## 2016-10-22 ENCOUNTER — Observation Stay (HOSPITAL_COMMUNITY)
Admission: EM | Admit: 2016-10-22 | Discharge: 2016-10-23 | Disposition: A | Discharge status: Home / Self Care | Payer: Medicare Other | Attending: Family Medicine | Admitting: Family Medicine

## 2016-10-22 ENCOUNTER — Emergency Department (HOSPITAL_COMMUNITY): Payer: Medicare Other

## 2016-10-22 ENCOUNTER — Other Ambulatory Visit (HOSPITAL_COMMUNITY): Payer: Self-pay

## 2016-10-22 DIAGNOSIS — F17210 Nicotine dependence, cigarettes, uncomplicated: Secondary | ICD-10-CM | POA: Insufficient documentation

## 2016-10-22 DIAGNOSIS — Z79899 Other long term (current) drug therapy: Secondary | ICD-10-CM | POA: Insufficient documentation

## 2016-10-22 DIAGNOSIS — M1990 Unspecified osteoarthritis, unspecified site: Secondary | ICD-10-CM | POA: Diagnosis not present

## 2016-10-22 DIAGNOSIS — I5032 Chronic diastolic (congestive) heart failure: Secondary | ICD-10-CM | POA: Diagnosis not present

## 2016-10-22 DIAGNOSIS — I110 Hypertensive heart disease with heart failure: Secondary | ICD-10-CM | POA: Diagnosis not present

## 2016-10-22 DIAGNOSIS — Z8673 Personal history of transient ischemic attack (TIA), and cerebral infarction without residual deficits: Secondary | ICD-10-CM | POA: Diagnosis not present

## 2016-10-22 DIAGNOSIS — Z794 Long term (current) use of insulin: Secondary | ICD-10-CM | POA: Insufficient documentation

## 2016-10-22 DIAGNOSIS — D509 Iron deficiency anemia, unspecified: Secondary | ICD-10-CM | POA: Diagnosis not present

## 2016-10-22 DIAGNOSIS — J449 Chronic obstructive pulmonary disease, unspecified: Secondary | ICD-10-CM | POA: Diagnosis not present

## 2016-10-22 DIAGNOSIS — I2510 Atherosclerotic heart disease of native coronary artery without angina pectoris: Secondary | ICD-10-CM | POA: Insufficient documentation

## 2016-10-22 DIAGNOSIS — Z8719 Personal history of other diseases of the digestive system: Secondary | ICD-10-CM | POA: Insufficient documentation

## 2016-10-22 DIAGNOSIS — R404 Transient alteration of awareness: Secondary | ICD-10-CM | POA: Diagnosis not present

## 2016-10-22 DIAGNOSIS — D649 Anemia, unspecified: Secondary | ICD-10-CM | POA: Diagnosis present

## 2016-10-22 DIAGNOSIS — E669 Obesity, unspecified: Secondary | ICD-10-CM | POA: Diagnosis not present

## 2016-10-22 DIAGNOSIS — D689 Coagulation defect, unspecified: Secondary | ICD-10-CM | POA: Diagnosis not present

## 2016-10-22 DIAGNOSIS — R05 Cough: Secondary | ICD-10-CM | POA: Diagnosis not present

## 2016-10-22 DIAGNOSIS — Z8711 Personal history of peptic ulcer disease: Secondary | ICD-10-CM | POA: Insufficient documentation

## 2016-10-22 DIAGNOSIS — R531 Weakness: Secondary | ICD-10-CM | POA: Diagnosis not present

## 2016-10-22 DIAGNOSIS — Z7901 Long term (current) use of anticoagulants: Secondary | ICD-10-CM | POA: Diagnosis not present

## 2016-10-22 DIAGNOSIS — I480 Paroxysmal atrial fibrillation: Secondary | ICD-10-CM | POA: Diagnosis not present

## 2016-10-22 DIAGNOSIS — Z6828 Body mass index (BMI) 28.0-28.9, adult: Secondary | ICD-10-CM | POA: Insufficient documentation

## 2016-10-22 DIAGNOSIS — R0602 Shortness of breath: Secondary | ICD-10-CM | POA: Diagnosis not present

## 2016-10-22 DIAGNOSIS — E119 Type 2 diabetes mellitus without complications: Secondary | ICD-10-CM

## 2016-10-22 DIAGNOSIS — E785 Hyperlipidemia, unspecified: Secondary | ICD-10-CM | POA: Diagnosis not present

## 2016-10-22 LAB — CBC
HEMATOCRIT: 24 % — AB (ref 39.0–52.0)
Hemoglobin: 7.4 g/dL — ABNORMAL LOW (ref 13.0–17.0)
MCH: 23.9 pg — ABNORMAL LOW (ref 26.0–34.0)
MCHC: 30.8 g/dL (ref 30.0–36.0)
MCV: 77.7 fL — ABNORMAL LOW (ref 78.0–100.0)
PLATELETS: 402 10*3/uL — AB (ref 150–400)
RBC: 3.09 MIL/uL — ABNORMAL LOW (ref 4.22–5.81)
RDW: 17.6 % — AB (ref 11.5–15.5)
WBC: 7.9 10*3/uL (ref 4.0–10.5)

## 2016-10-22 LAB — COMPREHENSIVE METABOLIC PANEL
ALBUMIN: 2.9 g/dL — AB (ref 3.5–5.0)
ALT: 39 U/L (ref 17–63)
AST: 33 U/L (ref 15–41)
Alkaline Phosphatase: 65 U/L (ref 38–126)
Anion gap: 10 (ref 5–15)
BUN: 15 mg/dL (ref 6–20)
CHLORIDE: 103 mmol/L (ref 101–111)
CO2: 24 mmol/L (ref 22–32)
Calcium: 8.7 mg/dL — ABNORMAL LOW (ref 8.9–10.3)
Creatinine, Ser: 0.76 mg/dL (ref 0.61–1.24)
GFR calc Af Amer: >60 mL/min (ref >60)
GFR calc non Af Amer: >60 mL/min (ref >60)
GLUCOSE: 172 mg/dL — AB (ref 65–99)
POTASSIUM: 4.2 mmol/L (ref 3.5–5.1)
SODIUM: 137 mmol/L (ref 135–145)
TOTAL PROTEIN: 5.9 g/dL — AB (ref 6.5–8.1)
Total Bilirubin: <0.1 mg/dL — ABNORMAL LOW (ref 0.3–1.2)

## 2016-10-22 LAB — URINALYSIS, ROUTINE W REFLEX MICROSCOPIC
Bilirubin Urine: NEGATIVE
Glucose, UA: NEGATIVE mg/dL
Hgb urine dipstick: NEGATIVE
Ketones, ur: NEGATIVE mg/dL
Nitrite: NEGATIVE
PROTEIN: NEGATIVE mg/dL
SPECIFIC GRAVITY, URINE: 1.01 (ref 1.005–1.030)
pH: 7 (ref 5.0–8.0)

## 2016-10-22 LAB — I-STAT TROPONIN, ED: Troponin i, poc: 0.01 ng/mL (ref 0.00–0.08)

## 2016-10-22 LAB — PROTIME-INR
INR: 2.07
PROTHROMBIN TIME: 23.6 s — AB (ref 11.4–15.2)

## 2016-10-22 LAB — POC OCCULT BLOOD, ED: FECAL OCCULT BLD: NEGATIVE

## 2016-10-22 LAB — CBG MONITORING, ED: GLUCOSE-CAPILLARY: 172 mg/dL — AB (ref 65–99)

## 2016-10-22 MED ORDER — ALBUTEROL SULFATE (2.5 MG/3ML) 0.083% IN NEBU
5.0000 mg | INHALATION_SOLUTION | Freq: Once | RESPIRATORY_TRACT | Status: AC
  Administered 2016-10-22: 5 mg via RESPIRATORY_TRACT
  Filled 2016-10-22: qty 6

## 2016-10-22 MED ORDER — ONDANSETRON HCL 4 MG PO TABS: 4.0000 mg | ORAL_TABLET | Freq: Four times a day (QID) | ORAL | Status: DC | PRN

## 2016-10-22 MED ORDER — ADULT MULTIVITAMIN W/MINERALS CH
1.0000 | ORAL_TABLET | Freq: Two times a day (BID) | ORAL | Status: DC
  Administered 2016-10-23 (×2): 1 via ORAL
  Filled 2016-10-22 (×2): qty 1

## 2016-10-22 MED ORDER — PRAVASTATIN SODIUM 40 MG PO TABS: 40.0000 mg | ORAL_TABLET | Freq: Every evening | ORAL | Status: DC

## 2016-10-22 MED ORDER — ACETAMINOPHEN 650 MG RE SUPP
650.0000 mg | Freq: Four times a day (QID) | RECTAL | Status: DC | PRN
Start: 2016-10-22 — End: 2016-10-23

## 2016-10-22 MED ORDER — ACETAMINOPHEN 325 MG PO TABS
650.0000 mg | ORAL_TABLET | Freq: Four times a day (QID) | ORAL | Status: DC | PRN
  Administered 2016-10-23: 650 mg via ORAL
  Filled 2016-10-22: qty 2

## 2016-10-22 MED ORDER — HYDROCODONE-ACETAMINOPHEN 5-325 MG PO TABS
1.0000 | ORAL_TABLET | Freq: Two times a day (BID) | ORAL | Status: DC | PRN
  Administered 2016-10-23: 1 via ORAL
  Filled 2016-10-22: qty 1

## 2016-10-22 MED ORDER — FLECAINIDE ACETATE 50 MG PO TABS
225.0000 mg | ORAL_TABLET | Freq: Two times a day (BID) | ORAL | Status: DC
  Administered 2016-10-23: 225 mg via ORAL
  Filled 2016-10-22: qty 1

## 2016-10-22 MED ORDER — SODIUM CHLORIDE 0.9 % IV SOLN
10.0000 mL/h | Freq: Once | INTRAVENOUS | Status: AC
  Administered 2016-10-23: 10 mL/h via INTRAVENOUS

## 2016-10-22 MED ORDER — INSULIN GLARGINE 100 UNIT/ML ~~LOC~~ SOLN
5.0000 [IU] | Freq: Two times a day (BID) | SUBCUTANEOUS | Status: DC
  Administered 2016-10-23: 5 [IU] via SUBCUTANEOUS
  Filled 2016-10-22 (×2): qty 0.05

## 2016-10-22 MED ORDER — DILTIAZEM HCL ER COATED BEADS 120 MG PO CP24
120.0000 mg | ORAL_CAPSULE | Freq: Every day | ORAL | Status: DC
  Administered 2016-10-23: 120 mg via ORAL
  Filled 2016-10-22: qty 1

## 2016-10-22 MED ORDER — METOPROLOL SUCCINATE ER 25 MG PO TB24
12.5000 mg | ORAL_TABLET | Freq: Every day | ORAL | Status: DC
  Filled 2016-10-22: qty 1

## 2016-10-22 MED ORDER — ONDANSETRON HCL 4 MG/2ML IJ SOLN: 4.0000 mg | Freq: Four times a day (QID) | INTRAMUSCULAR | Status: DC | PRN

## 2016-10-22 MED ORDER — NITROGLYCERIN 0.4 MG SL SUBL: 0.4000 mg | SUBLINGUAL_TABLET | SUBLINGUAL | Status: DC | PRN

## 2016-10-22 MED ORDER — LORATADINE 10 MG PO TABS: 10.0000 mg | ORAL_TABLET | Freq: Every evening | ORAL | Status: DC

## 2016-10-22 MED ORDER — FERROUS SULFATE 325 (65 FE) MG PO TABS
325.0000 mg | ORAL_TABLET | Freq: Every day | ORAL | Status: DC
  Administered 2016-10-23: 325 mg via ORAL
  Filled 2016-10-22: qty 1

## 2016-10-22 MED ORDER — TAMSULOSIN HCL 0.4 MG PO CAPS
0.4000 mg | ORAL_CAPSULE | Freq: Every day | ORAL | Status: DC
  Administered 2016-10-23: 0.4 mg via ORAL
  Filled 2016-10-22: qty 1

## 2016-10-22 MED ORDER — INSULIN ASPART 100 UNIT/ML ~~LOC~~ SOLN
0.0000 [IU] | Freq: Three times a day (TID) | SUBCUTANEOUS | Status: DC
  Administered 2016-10-23 (×2): 1 [IU] via SUBCUTANEOUS

## 2016-10-22 NOTE — H&P (Signed)
History and Physical    [name] IWP:[id number] DOB: Oct 30, 1940 DOA: 10/22/2016  PCP: Bernerd Limbo, MD  Patient coming from: Home.  Chief Complaint: Weakness.  HPI: [name] is a 76 y.o. male with history of atrial fibrillation/flutter on flecainide and Cardizem and Coumadin, history of partial gastrectomy and had EGD in April of this year which showed gastric ulcer, diabetes mellitus type 2 who was recently admitted 2 weeks ago for abdominal pain and anemia and was transfused 2 units of PRBC and has outpatient follow-up with gastroenterology presents to the ER with complaints of increasing weakness and fatigue over the last few days. Patient has been having exertional shortness of breath. Denies any chest pain. Patient states he is continued to have blood transfusion this coming Wednesday 4 days from now. But since patient's symptoms have worsened patient came to the ER.   ED Course: In the ER patient's hemoglobin is around 7.4. At the time of discharge 2 weeks ago was around 9. Had dropped to around 8 last week. Recent anemia panel shows low ferritin and iron levels. Patient is being admitted for symptomatic anemia. Chest x-ray was unremarkable and EKG was showing nonspecific findings.  Review of Systems: As per HPI, rest all negative.   Past Medical History:  Diagnosis Date  . Anemia    takes Ferrous Sulfate daily  . Arthritis    "all over"  . Atrial flutter ([place])    a. 07/2010 Status post caval tricuspid isthmus ablation by Dr. Midge Aver Metoprolol daily  . Balance problem 01/2014  . CAP (community acquired pneumonia) 09/18/2014  . Cervical radiculopathy due to degenerative joint disease of spine   . COPD (chronic obstructive pulmonary disease) ([place])   . Coronary artery disease, non-occlusive    a. 03/2010 Nonocclusive disease by cath, performed for ST elevations on ECG;  b. 06/2013 Lexi MV: EF 60%, no ischemia.  . Diabetes mellitus type II    takes Metformin and  Lantus daily  . Diastolic CHF, chronic ([place])    a. 12/2012 EF 55-60%, diast dysfxn, triv MR, mildly dil LA/RA.  [name] Dysrhythmia    HX OF ATRIAL IFB /FLUTTER takes Flecanide and Coumadin daily  . History of blood transfusion 1982   "when I had stomach OR"  . History of bronchitis    1998  . History of gastric ulcer   . HTN (hypertension)    takes Diltiazem daily  . Hyperlipidemia    takes Pravastatin daily  . Joint pain   . PAF (paroxysmal atrial fibrillation) ([place])    a. Recurrent after atrial flutter, currently controlled on flecainide plus diltiazem  . Pneumonia 1999  . Weakness    numbness and tingling both hands    Past Surgical History:  Procedure Laterality Date  . ATRIAL ABLATION SURGERY  08/05/10   CTI ablation for atrial flutter by JA  . CARDIAC CATHETERIZATION  2012   nl LV function, no occlusive CAD, PAF  . CARDIOVERSION  12/07/2010    Successful direct current cardioversion with atrial fibrillation to normal sinus rhythm  . CARPAL TUNNEL RELEASE Bilateral 01/30/2014   Procedure: BILATERAL CARPAL TUNNEL RELEASE;  Surgeon: Marianna Payment, MD;  Location: [place];  Service: Orthopedics;  Laterality: Bilateral;  . CATARACT EXTRACTION W/ INTRAOCULAR LENS  IMPLANT, BILATERAL Bilateral   . COLONOSCOPY [name] 12/02/2013   Procedure: COLONOSCOPY;  Surgeon: Irene Shipper, MD;  Location: [place];  Service: Endoscopy;  Laterality: [name];  . ESOPHAGOGASTRODUODENOSCOPY [name] 09/22/2014   Procedure:  ESOPHAGOGASTRODUODENOSCOPY (EGD);  Surgeon: Ronald Lobo, MD;  Location: [facility] ENDOSCOPY;  Service: Endoscopy;  Laterality: [name];  . ESOPHAGOGASTRODUODENOSCOPY [name] 07/11/2016   Procedure: ESOPHAGOGASTRODUODENOSCOPY (EGD);  Surgeon: Doran Stabler, MD;  Location: [facility] ENDOSCOPY;  Service: Endoscopy;  Laterality: [name];  . INCISION AND DRAINAGE ABSCESS / HEMATOMA OF BURSA / KNEE / THIGH Left 1998   knee  . KNEE BURSECTOMY Left 1998  . LAPAROSCOPIC CHOLECYSTECTOMY  03/2010  . NM MYOVIEW LTD   07/22/2013   Normal EF ~60%, no ischemia or infarction.  [name] PARTIAL GASTRECTOMY  1982   subtotal; "took out 30% for ulcers"  . TRANSTHORACIC ECHOCARDIOGRAM  02/16/2014   EF 60%, no RWMA. - otherwise normal  . YAG LASER APPLICATION Bilateral      reports that he has been smoking Cigarettes.  He has a 27.50 pack-year smoking history. He has never used smokeless tobacco. He reports that he does not drink alcohol or use drugs.  Allergies  Allergen Reactions  . Flexeril [Cyclobenzaprine] Other (See Comments)    Makes it hard for him to stand    Family History  Problem Relation Age of Onset  . Cancer Mother   . Heart attack Father   . Heart attack Brother     Prior to Admission medications   Medication Sig Start Date End Date Taking? Authorizing Provider  acetaminophen (TYLENOL) 650 MG CR tablet Take 650 mg by mouth 2 (two) times daily as needed for pain.   Yes [provider]  diltiazem (CARDIZEM CD) 120 MG 24 hr capsule Take 1 capsule (120 mg total) by mouth daily. 03/19/15  Yes Kilroy, Luke K, PA-C  ferrous sulfate 325 (65 FE) MG tablet Take 325 mg by mouth daily with breakfast.   Yes [provider]  flecainide (TAMBOCOR) 150 MG tablet TAKE 1 AND 1/2 TABLETS TWICE DAILY Patient taking differently: TAKE 225 MG BY MOUTH TWICE DAILY 08/14/16  Yes Leonie Man, MD  HYDROcodone-acetaminophen (NORCO/VICODIN) 5-325 MG tablet Take 1 tablet by mouth 2 (two) times daily as needed for moderate pain.   Yes [provider]  Insulin Glargine (LANTUS) 100 UNIT/ML Solostar Pen Inject 8 Units into the skin at bedtime. Patient taking differently: Inject 8 Units into the skin 2 (two) times daily.  07/12/16  Yes Holley Raring, MD  loratadine (CLARITIN) 10 MG tablet Take 10 mg by mouth every evening.    Yes [provider]  metFORMIN (GLUCOPHAGE) 1000 MG tablet Take 1,000 mg by mouth 2 (two) times daily with a meal.    Yes [provider]  metoprolol  succinate (TOPROL-XL) 25 MG 24 hr tablet TAKE 1/2 TABLET  (12.5 MG TOTAL) BY MOUTH DAILY. 10/04/16  Yes Leonie Man, MD  Multiple Vitamin (MULTIVITAMIN WITH MINERALS) TABS tablet Take 1 tablet by mouth 2 (two) times daily.    Yes [provider]  nitroGLYCERIN (NITROSTAT) 0.4 MG SL tablet Place 1 tablet (0.4 mg total) under the tongue every 5 (five) minutes x 3 doses as needed for chest pain. 11/19/15  Yes Kilroy, Luke K, PA-C  pantoprazole (PROTONIX) 40 MG tablet Take 40 mg by mouth daily.  10/06/16  Yes [provider]  polyethylene glycol (MIRALAX / GLYCOLAX) packet Take 17 g by mouth 2 (two) times daily. 10/03/16  Yes Sheikh, [place], DO  pravastatin (PRAVACHOL) 40 MG tablet TAKE 1 TABLET EVERY EVENING Patient taking differently: TAKE 40 MG BY MOUTH EVERY EVENING 09/22/16  Yes Leonie Man, MD  senna-docusate (  SENOKOT-S) 8.6-50 MG tablet Take 1 tablet by mouth 2 (two) times daily. 10/03/16  Yes Sheikh, Omair Latif, DO  tamsulosin (FLOMAX) 0.4 MG CAPS capsule Take 0.4 mg by mouth daily.  10/01/16  Yes [provider]  warfarin (COUMADIN) 5 MG tablet TAKE 1 TO 1 AND 1/2 TABLETS DAILY AS DIRECTED Patient taking differently: Take 5 mg by mouth daily on Monday, Wednesday and Friday. Take 7.5 mg by mouth daily on all other days 08/29/16  Yes Troy Sine, MD  cyclobenzaprine (FLEXERIL) 5 MG tablet Take 2 tablets (10 mg total) by mouth 3 (three) times daily as needed for muscle spasms. Patient not taking: Reported on 10/22/2016 09/29/16   Charlann Lange, PA-C  traMADol (ULTRAM) 50 MG tablet Take 1 tablet (50 mg total) by mouth every 6 (six) hours as needed. Patient not taking: Reported on 10/22/2016 09/29/16   Charlann Lange, PA-C    Physical Exam: Vitals:   10/22/16 2148 10/22/16 2200 10/22/16 2230 10/22/16 2256  BP: 91/71 117/78 117/70 (!) 118/56  Pulse: 88  82 86  Resp: 18 (!) 22 20 20   Temp:    98.3 F (36.8 C)  TempSrc:    Oral  SpO2: 99%  98% 99%  Weight:     79.7 kg (175 lb 11.3 oz)  Height:    5\' 6"  (1.676 m)      Constitutional: Moderately built and nourished. Vitals:   10/22/16 2148 10/22/16 2200 10/22/16 2230 10/22/16 2256  BP: 91/71 117/78 117/70 (!) 118/56  Pulse: 88  82 86  Resp: 18 (!) 22 20 20   Temp:    98.3 F (36.8 C)  TempSrc:    Oral  SpO2: 99%  98% 99%  Weight:    79.7 kg (175 lb 11.3 oz)  Height:    5\' 6"  (1.676 m)   Eyes: Anicteric mild pallor. ENMT: No discharge from the ears eyes nose and mouth. Neck: No mass felt. No neck rigidity. Respiratory: No rhonchi or crepitations. Cardiovascular: S1 and S2 heard no murmurs appreciated. Abdomen: Soft nontender bowel sounds present. No guarding or rigidity. Musculoskeletal: No edema. No joint effusion. Skin: No rash. Skin appears warm. Neurologic: Alert awake oriented to time place and person. Moves all extremities. Psychiatric: Appears normal. Normal affect.   Labs on Admission: I have personally reviewed following labs and imaging studies  CBC:  Recent Labs Lab 10/20/16 1110 10/22/16 1944  WBC 7.2 7.9  NEUTROABS 5.3  --   HGB 8.1* 7.4*  HCT 26.7* 24.0*  MCV 81* 77.7*  PLT 450* 962*   Basic Metabolic Panel:  Recent Labs Lab 10/20/16 1110 10/22/16 1954  NA 139 137  K 4.0 4.2  CL  --  103  CO2 26 24  GLUCOSE 101 172*  BUN 15.2 15  CREATININE 0.8 0.76  CALCIUM 9.6 8.7*   GFR: Estimated Creatinine Clearance: 76.8 mL/min (by C-G formula based on SCr of 0.76 mg/dL). Liver Function Tests:  Recent Labs Lab 10/20/16 1110 10/22/16 1954  AST 33 33  ALT 42 39  ALKPHOS 85 65  BILITOT <0.22 <0.1*  PROT 6.5 5.9*  ALBUMIN 3.2* 2.9*   No results for input(s): LIPASE, AMYLASE in the last 168 hours. No results for input(s): AMMONIA in the last 168 hours. Coagulation Profile:  Recent Labs Lab 10/19/16 10/22/16 1954  INR 2.4* 2.07   Cardiac Enzymes: No results for input(s): CKTOTAL, CKMB, CKMBINDEX, TROPONINI in the last 168 hours. BNP (last 3  results) No results for input(s):  PROBNP in the last 8760 hours. HbA1C: No results for input(s): HGBA1C in the last 72 hours. CBG:  Recent Labs Lab 10/22/16 1956  GLUCAP 172*   Lipid Profile: No results for input(s): CHOL, HDL, LDLCALC, TRIG, CHOLHDL, LDLDIRECT in the last 72 hours. Thyroid Function Tests: No results for input(s): TSH, T4TOTAL, FREET4, T3FREE, THYROIDAB in the last 72 hours. Anemia Panel:  Recent Labs  10/20/16 1110  FERRITIN 6*  TIBC 401  IRON 14*   Urine analysis:    Component Value Date/Time   COLORURINE STRAW (A) 10/22/2016 1954   APPEARANCEUR CLEAR 10/22/2016 1954   LABSPEC 1.010 10/22/2016 1954   PHURINE 7.0 10/22/2016 [place] 10/22/2016 1954   HGBUR NEGATIVE 10/22/2016 [place] NEGATIVE 10/22/2016 [place] NEGATIVE 10/22/2016 1954   PROTEINUR NEGATIVE 10/22/2016 1954   UROBILINOGEN 0.2 10/09/2014 2315   NITRITE NEGATIVE 10/22/2016 1954   LEUKOCYTESUR LARGE (A) 10/22/2016 1954   Sepsis Labs: @LABRCNTIP (procalcitonin:4,lacticidven:4) )No results found for this or any previous visit (from the past 240 hour(s)).   Radiological Exams on Admission: Dg Chest 2 View  Result Date: 10/22/2016 CLINICAL DATA:  Shortness of breath and weakness. Cough for a day. History of bronchitis. Current smoker EXAM: CHEST  2 VIEW COMPARISON:  09/28/2016 FINDINGS: Peribronchial thickening with perihilar linear opacities consistent with chronic bronchitis. Normal heart size and pulmonary vascularity. No focal airspace disease or consolidation in the lungs. No blunting of costophrenic angles. No pneumothorax. Mediastinal contours appear intact. Surgical clips in the right upper abdomen and EG junction. Degenerative changes in the spine. IMPRESSION: Chronic bronchitic changes in the lungs. No evidence of active consolidation. Electronically Signed   By: Lucienne Capers M.D.   On: 10/22/2016 21:09    EKG: Independently reviewed. Normal  sinus rhythm with nonspecific ST-T changes.  Assessment/Plan Principal Problem:   Symptomatic anemia Active Problems:   Chronic diastolic congestive heart failure (HCC)   Type 2 diabetes mellitus (HCC)   PAF (paroxysmal atrial fibrillation), maintaining SR; CHA2DS2Vasc = 7    1. Symptomatic anemia - recent anemia panel shows patient has iron deficiency anemia. Patient has had EGD in April of this year which shows gastric ulceration. At this time patient is receiving 2 units of PRBC. Recheck hemoglobin in a.m. Continue with PPI. 2. Paroxysmal atrial fibrillation - patient is on flecainide and Cardizem which will be continued. Patient states he has already taken his Coumadin. If hemoglobin increases appropriately for the transfusion and his gastroenterologist is not planning any procedure (please check with them in a.m.) them continue Coumadin. 3. Diabetes mellitus type 2 - have decrease patient's Lantus dose from 8 units to 6 units twice a day. Sliding scale coverage. Closely follow CBGs.  I have reviewed patient's old chart from last.   DVT prophylaxis: SCDs and patient has therapeutic INR. See #1. Code Status: Full code.  Family Communication: Discussed with patient.  Disposition Plan: Home.  Consults called: None.  Admission status: Observation.    Rise Patience MD [facility] Pager [phone].  If 7PM-7AM, please contact night-coverage [email or web address] Password [facility]  10/22/2016, 11:41 PM

## 2016-10-22 NOTE — ED Provider Notes (Signed)
[facility] DEPT Provider Note   CSN: [id number] Arrival date & time: 10/22/16  1906     History   Chief Complaint Chief Complaint  Patient presents with  . Generalized Weakness    HPI [name] is a 76 y.o. male.  The history is provided by the patient. No language interpreter was used.   [name] is a 76 y.o. male who presents to the Emergency Department complaining of weakness.  He reports generalized weakness with shortness of breath over the last 1-2 days. He saw his family doctor on Friday was told his Hgb was 8 and he would need a transfusion on Wednesday. He presents today because he feels too weak to wait until Wednesday. He is told that he has a bleeding ulcer but notes brown stool at home. He is currently on Coumadin for atrial fibrillation. He denies any fevers, chest pain, bowel pain, vomiting, diarrhea. He does have a cough. Past Medical History:  Diagnosis Date  . Anemia    takes Ferrous Sulfate daily  . Arthritis    "all over"  . Atrial flutter ([place])    a. 07/2010 Status post caval tricuspid isthmus ablation by Dr. Midge Aver Metoprolol daily  . Balance problem 01/2014  . CAP (community acquired pneumonia) 09/18/2014  . Cervical radiculopathy due to degenerative joint disease of spine   . COPD (chronic obstructive pulmonary disease) ([place])   . Coronary artery disease, non-occlusive    a. 03/2010 Nonocclusive disease by cath, performed for ST elevations on ECG;  b. 06/2013 Lexi MV: EF 60%, no ischemia.  . Diabetes mellitus type II    takes Metformin and Lantus daily  . Diastolic CHF, chronic ([place])    a. 12/2012 EF 55-60%, diast dysfxn, triv MR, mildly dil LA/RA.  [name] Dysrhythmia    HX OF ATRIAL IFB /FLUTTER takes Flecanide and Coumadin daily  . History of blood transfusion 1982   "when I had stomach OR"  . History of bronchitis    1998  . History of gastric ulcer   . HTN (hypertension)    takes Diltiazem daily  . Hyperlipidemia    takes  Pravastatin daily  . Joint pain   . PAF (paroxysmal atrial fibrillation) ([place])    a. Recurrent after atrial flutter, currently controlled on flecainide plus diltiazem  . Pneumonia 1999  . Weakness    numbness and tingling both hands    Patient Active Problem List   Diagnosis Date Noted  . Acute blood loss anemia 10/02/2016  . Abdominal pain 10/02/2016  . Constipation 10/02/2016  . Left lower quadrant pain   . Coagulopathy ([place])   . Heme positive stool   . Symptomatic anemia 07/10/2016  . Syncope 05/15/2015  . Cervical disc disease 05/15/2015  . Abnormal urinalysis 05/15/2015  . Anemia 05/15/2015  . History of CVA (cerebrovascular accident) 04/03/2014  . Near syncope 02/15/2014  . Pre-syncope 02/15/2014  . Benign neoplasm of rectum and anal canal 12/02/2013  . GI bleed 11/30/2013  . Tobacco use disorder 11/30/2013  . PAF (paroxysmal atrial fibrillation), maintaining SR; CHA2DS2Vasc = 7 08/15/2013  . Anticoagulation goal of INR 2 to 3, for PAF - CHA2DS2Vasc = 7 08/15/2013  . Type 2 diabetes mellitus ([place]) 01/22/2013  . Tobacco abuse counseling 12/15/2012  . Hyperlipidemia   . Obesity (BMI 30-39.9) 12/14/2012  . Atrial flutter - status post CTI ablation 07/29/2010  . Essential hypertension 07/29/2010  . Chronic diastolic congestive heart failure ([place]) 07/29/2010  . Coronary artery disease, non-occlusive  07/29/2010    Past Surgical History:  Procedure Laterality Date  . ATRIAL ABLATION SURGERY  08/05/10   CTI ablation for atrial flutter by JA  . CARDIAC CATHETERIZATION  2012   nl LV function, no occlusive CAD, PAF  . CARDIOVERSION  12/07/2010    Successful direct current cardioversion with atrial fibrillation to normal sinus rhythm  . CARPAL TUNNEL RELEASE Bilateral 01/30/2014   Procedure: BILATERAL CARPAL TUNNEL RELEASE;  Surgeon: Marianna Payment, MD;  Location: [facility];  Service: Orthopedics;  Laterality: Bilateral;  . CATARACT EXTRACTION W/ INTRAOCULAR LENS  IMPLANT,  BILATERAL Bilateral   . COLONOSCOPY [name] 12/02/2013   Procedure: COLONOSCOPY;  Surgeon: Irene Shipper, MD;  Location: [facility];  Service: Endoscopy;  Laterality: [name];  . ESOPHAGOGASTRODUODENOSCOPY [name] 09/22/2014   Procedure: ESOPHAGOGASTRODUODENOSCOPY (EGD);  Surgeon: Ronald Lobo, MD;  Location: [facility] ENDOSCOPY;  Service: Endoscopy;  Laterality: [name];  . ESOPHAGOGASTRODUODENOSCOPY [name] 07/11/2016   Procedure: ESOPHAGOGASTRODUODENOSCOPY (EGD);  Surgeon: Doran Stabler, MD;  Location: [facility]COPY;  Service: Endoscopy;  Laterality: [name];  . INCISION AND DRAINAGE ABSCESS / HEMATOMA OF BURSA / KNEE / THIGH Left 1998   knee  . KNEE BURSECTOMY Left 1998  . LAPAROSCOPIC CHOLECYSTECTOMY  03/2010  . NM MYOVIEW LTD  07/22/2013   Normal EF ~60%, no ischemia or infarction.  [name] PARTIAL GASTRECTOMY  1982   subtotal; "took out 30% for ulcers"  . TRANSTHORACIC ECHOCARDIOGRAM  02/16/2014   EF 60%, no RWMA. - otherwise normal  . YAG LASER APPLICATION Bilateral        Home Medications    Prior to Admission medications   Medication Sig Start Date End Date Taking? Authorizing Provider  acetaminophen (TYLENOL) 650 MG CR tablet Take 650 mg by mouth 2 (two) times daily as needed for pain.   Yes [provider]  diltiazem (CARDIZEM CD) 120 MG 24 hr capsule Take 1 capsule (120 mg total) by mouth daily. 03/19/15  Yes Kilroy, Luke K, PA-C  ferrous sulfate 325 (65 FE) MG tablet Take 325 mg by mouth daily with breakfast.   Yes [provider]  flecainide (TAMBOCOR) 150 MG tablet TAKE 1 AND 1/2 TABLETS TWICE DAILY Patient taking differently: TAKE 225 MG BY MOUTH TWICE DAILY 08/14/16  Yes Leonie Man, MD  HYDROcodone-acetaminophen (NORCO/VICODIN) 5-325 MG tablet Take 1 tablet by mouth 2 (two) times daily as needed for moderate pain.   Yes [provider]  Insulin Glargine (LANTUS) 100 UNIT/ML Solostar Pen Inject 8 Units into the skin at bedtime. Patient taking differently: Inject 8 Units  into the skin 2 (two) times daily.  07/12/16  Yes Holley Raring, MD  loratadine (CLARITIN) 10 MG tablet Take 10 mg by mouth every evening.    Yes [provider]  metFORMIN (GLUCOPHAGE) 1000 MG tablet Take 1,000 mg by mouth 2 (two) times daily with a meal.    Yes [provider]  metoprolol succinate (TOPROL-XL) 25 MG 24 hr tablet TAKE 1/2 TABLET  (12.5 MG TOTAL) BY MOUTH DAILY. 10/04/16  Yes Leonie Man, MD  Multiple Vitamin (MULTIVITAMIN WITH MINERALS) TABS tablet Take 1 tablet by mouth 2 (two) times daily.    Yes [provider]  nitroGLYCERIN (NITROSTAT) 0.4 MG SL tablet Place 1 tablet (0.4 mg total) under the tongue every 5 (five) minutes x 3 doses as needed for chest pain. 11/19/15  Yes Kilroy, Luke K, PA-C  pantoprazole (PROTONIX) 40 MG tablet Take 40 mg by mouth daily.  10/06/16  Yes [provider]  polyethylene glycol (MIRALAX / GLYCOLAX) packet Take 17 g by mouth 2 (two) times daily. 10/03/16  Yes Sheikh, [place], DO  pravastatin (PRAVACHOL) 40 MG tablet TAKE 1 TABLET EVERY EVENING Patient taking differently: TAKE 40 MG BY MOUTH EVERY EVENING 09/22/16  Yes Leonie Man, MD  senna-docusate (SENOKOT-S) 8.6-50 MG tablet Take 1 tablet by mouth 2 (two) times daily. 10/03/16  Yes Sheikh, Omair Latif, DO  tamsulosin (FLOMAX) 0.4 MG CAPS capsule Take 0.4 mg by mouth daily.  10/01/16  Yes [provider]  warfarin (COUMADIN) 5 MG tablet TAKE 1 TO 1 AND 1/2 TABLETS DAILY AS DIRECTED Patient taking differently: Take 5 mg by mouth daily on Monday, Wednesday and Friday. Take 7.5 mg by mouth daily on all other days 08/29/16  Yes Troy Sine, MD  cyclobenzaprine (FLEXERIL) 5 MG tablet Take 2 tablets (10 mg total) by mouth 3 (three) times daily as needed for muscle spasms. Patient not taking: Reported on 10/22/2016 09/29/16   Charlann Lange, PA-C  traMADol (ULTRAM) 50 MG tablet Take 1 tablet (50 mg total) by mouth every 6 (six) hours as needed. Patient not  taking: Reported on 10/22/2016 09/29/16   Charlann Lange, PA-C    Family History Family History  Problem Relation Age of Onset  . Cancer Mother   . Heart attack Father   . Heart attack Brother     Social History Social History  Substance Use Topics  . Smoking status: Current Every Day Smoker    Packs/day: 0.50    Years: 55.00    Types: Cigarettes  . Smokeless tobacco: Never Used     Comment: He's been smoking between 0.5 and 2 ppd since age 89.  [name] Alcohol use No     Comment: "quit drinking in 1986"     Allergies   Flexeril [cyclobenzaprine]   Review of Systems Review of Systems  All other systems reviewed and are negative.    Physical Exam Updated Vital Signs BP (!) 118/56 (BP Location: Right Arm)   Pulse 86   Temp 98.3 F (36.8 C) (Oral)   Resp 20   Ht 5\' 6"  (1.676 m)   Wt 79.7 kg (175 lb 11.3 oz)   SpO2 99%   BMI 28.36 kg/m   Physical Exam  Constitutional: He is oriented to person, place, and time. He appears well-developed and well-nourished.  HENT:  Head: Normocephalic and atraumatic.  Cardiovascular: Regular rhythm.   No murmur heard. Tachycardic  Pulmonary/Chest: Effort normal. No respiratory distress.  Occasional wheeze in left lung fields  Abdominal: Soft. There is no tenderness. There is no rebound and no guarding.  Genitourinary:  Genitourinary Comments: Empty rectal vault, no gross blood  Musculoskeletal: He exhibits no edema or tenderness.  Neurological: He is alert and oriented to person, place, and time.  mild generalized weakness  Skin: Skin is warm and dry.  Psychiatric: He has a normal mood and affect. His behavior is normal.  Nursing note and vitals reviewed.    ED Treatments / Results  Labs (all labs ordered are listed, but only abnormal results are displayed) Labs Reviewed  CBC - Abnormal; Notable for the following:       Result Value   RBC 3.09 (*)    Hemoglobin 7.4 (*)    HCT 24.0 (*)    MCV 77.7 (*)    MCH 23.9 (*)     RDW 17.6 (*)    Platelets 402 (*)  All other components within normal limits  URINALYSIS, ROUTINE W REFLEX MICROSCOPIC - Abnormal; Notable for the following:    Color, Urine STRAW (*)    Leukocytes, UA LARGE (*)    Bacteria, UA RARE (*)    Squamous Epithelial / LPF 0-5 (*)    All other components within normal limits  COMPREHENSIVE METABOLIC PANEL - Abnormal; Notable for the following:    Glucose, Bld 172 (*)    Calcium 8.7 (*)    Total Protein 5.9 (*)    Albumin 2.9 (*)    Total Bilirubin <0.1 (*)    All other components within normal limits  PROTIME-INR - Abnormal; Notable for the following:    Prothrombin Time 23.6 (*)    All other components within normal limits  CBG MONITORING, ED - Abnormal; Notable for the following:    Glucose-Capillary 172 (*)    All other components within normal limits  BASIC METABOLIC PANEL  CBC  I-STAT TROPONIN, ED  POC OCCULT BLOOD, ED  TYPE AND SCREEN  PREPARE RBC (CROSSMATCH)    EKG  EKG Interpretation None       Radiology Dg Chest 2 View  Result Date: 10/22/2016 CLINICAL DATA:  Shortness of breath and weakness. Cough for a day. History of bronchitis. Current smoker EXAM: CHEST  2 VIEW COMPARISON:  09/28/2016 FINDINGS: Peribronchial thickening with perihilar linear opacities consistent with chronic bronchitis. Normal heart size and pulmonary vascularity. No focal airspace disease or consolidation in the lungs. No blunting of costophrenic angles. No pneumothorax. Mediastinal contours appear intact. Surgical clips in the right upper abdomen and EG junction. Degenerative changes in the spine. IMPRESSION: Chronic bronchitic changes in the lungs. No evidence of active consolidation. Electronically Signed   By: Lucienne Capers M.D.   On: 10/22/2016 21:09    Procedures Procedures (including critical care time)  Medications Ordered in ED Medications  0.9 %  sodium chloride infusion (not administered)  HYDROcodone-acetaminophen  (NORCO/VICODIN) 5-325 MG per tablet 1 tablet (1 tablet Oral Given 10/23/16 0056)  tamsulosin (FLOMAX) capsule 0.4 mg (not administered)  metoprolol succinate (TOPROL-XL) 24 hr tablet 12.5 mg (not administered)  pravastatin (PRAVACHOL) tablet 40 mg (not administered)  flecainide (TAMBOCOR) tablet 225 mg (not administered)  insulin glargine (LANTUS) injection 5 Units (not administered)  nitroGLYCERIN (NITROSTAT) SL tablet 0.4 mg (not administered)  diltiazem (CARDIZEM CD) 24 hr capsule 120 mg (not administered)  multivitamin with minerals tablet 1 tablet (1 tablet Oral Given 10/23/16 0056)  ferrous sulfate tablet 325 mg (not administered)  loratadine (CLARITIN) tablet 10 mg (not administered)  acetaminophen (TYLENOL) tablet 650 mg (not administered)    Or  acetaminophen (TYLENOL) suppository 650 mg (not administered)  ondansetron (ZOFRAN) tablet 4 mg (not administered)    Or  ondansetron (ZOFRAN) injection 4 mg (not administered)  insulin aspart (novoLOG) injection 0-9 Units (not administered)  feeding supplement (ENSURE ENLIVE) (ENSURE ENLIVE) liquid 237 mL (not administered)  albuterol (PROVENTIL) (2.5 MG/3ML) 0.083% nebulizer solution 5 mg (5 mg Nebulization Given 10/22/16 2025)     Initial Impression / Assessment and Plan / ED Course  I have reviewed the triage vital signs and the nursing notes.  Pertinent labs & imaging results that were available during my care of the patient were reviewed by me and considered in my medical decision making (see chart for details).     Patient here for evaluation of generalized weakness, shortness of breath. He is on Coumadin for atrial fibrillation and has history of GI bleeding. Rectal  exam with no gross blood and Hemoccult negative. CBC demonstrates progressive anemia with hemoglobin to 7.4 from 8.1 3 days ago. Plan to transfuse and admit to hospital service for ongoing transfusions. Patient updated findings and studies an hospitalist consulted for  admission.  Presentation is not consistent with ACS, PE, CHF, pneumonia.  Final Clinical Impressions(s) / ED Diagnoses   Final diagnoses:  None    New Prescriptions Current Discharge Medication List       Quintella Reichert, MD 10/23/16 [phone]

## 2016-10-22 NOTE — ED Triage Notes (Signed)
Brought in by EMS from home with c/o generalized weakness with shortness of breath on exertion.  Pt stated that he is having these symptoms, he "needed blood transfusion".  Pt stated that he is scheduled to have blood transfusion this coming Wednesday, but he "can not wait that long".

## 2016-10-22 NOTE — ED Notes (Signed)
Bed: QA06 Expected date:  Expected time:  Means of arrival:  Comments: 33m weakness

## 2016-10-23 DIAGNOSIS — D649 Anemia, unspecified: Secondary | ICD-10-CM | POA: Diagnosis not present

## 2016-10-23 DIAGNOSIS — E1100 Type 2 diabetes mellitus with hyperosmolarity without nonketotic hyperglycemic-hyperosmolar coma (NKHHC): Secondary | ICD-10-CM

## 2016-10-23 DIAGNOSIS — I5032 Chronic diastolic (congestive) heart failure: Secondary | ICD-10-CM

## 2016-10-23 DIAGNOSIS — I480 Paroxysmal atrial fibrillation: Secondary | ICD-10-CM | POA: Diagnosis not present

## 2016-10-23 LAB — CBC
HCT: 24.9 % — ABNORMAL LOW (ref 39.0–52.0)
Hemoglobin: 7.7 g/dL — ABNORMAL LOW (ref 13.0–17.0)
MCH: 24.8 pg — ABNORMAL LOW (ref 26.0–34.0)
MCHC: 30.9 g/dL (ref 30.0–36.0)
MCV: 80.1 fL (ref 78.0–100.0)
PLATELETS: 347 10*3/uL (ref 150–400)
RBC: 3.11 MIL/uL — AB (ref 4.22–5.81)
RDW: 17.4 % — AB (ref 11.5–15.5)
WBC: 6.2 10*3/uL (ref 4.0–10.5)

## 2016-10-23 LAB — TROPONIN I

## 2016-10-23 LAB — HEMOGLOBIN AND HEMATOCRIT, BLOOD
HCT: 31.2 % — ABNORMAL LOW (ref 39.0–52.0)
HEMOGLOBIN: 9.6 g/dL — AB (ref 13.0–17.0)

## 2016-10-23 LAB — BASIC METABOLIC PANEL
Anion gap: 6 (ref 5–15)
BUN: 15 mg/dL (ref 6–20)
CALCIUM: 8.3 mg/dL — AB (ref 8.9–10.3)
CHLORIDE: 105 mmol/L (ref 101–111)
CO2: 27 mmol/L (ref 22–32)
CREATININE: 0.78 mg/dL (ref 0.61–1.24)
Glucose, Bld: 117 mg/dL — ABNORMAL HIGH (ref 65–99)
Potassium: 3.9 mmol/L (ref 3.5–5.1)
SODIUM: 138 mmol/L (ref 135–145)

## 2016-10-23 LAB — PROTIME-INR
INR: 2.2
PROTHROMBIN TIME: 24.8 s — AB (ref 11.4–15.2)

## 2016-10-23 LAB — GLUCOSE, CAPILLARY
GLUCOSE-CAPILLARY: 126 mg/dL — AB (ref 65–99)
Glucose-Capillary: 145 mg/dL — ABNORMAL HIGH (ref 65–99)

## 2016-10-23 LAB — PREPARE RBC (CROSSMATCH)

## 2016-10-23 MED ORDER — PANTOPRAZOLE SODIUM 40 MG IV SOLR
40.0000 mg | Freq: Two times a day (BID) | INTRAVENOUS | Status: DC
  Administered 2016-10-23: 40 mg via INTRAVENOUS
  Filled 2016-10-23 (×2): qty 40

## 2016-10-23 MED ORDER — ENSURE ENLIVE PO LIQD: 237.0000 mL | Freq: Two times a day (BID) | ORAL | Status: DC

## 2016-10-23 NOTE — Discharge Summary (Signed)
Physician Discharge Summary  [name] UKG:[id number] DOB: June 27, 1940 DOA: 10/22/2016  PCP: Bernerd Limbo, MD  Admit date: 10/22/2016 Discharge date: 10/23/2016  Admitted From: Home Disposition: Home   Recommendations for Outpatient Follow-up:  1. Follow up with [facility], Dr. Paulita Fujita in the next 1 - 2 weeks.  2. Continue outpatient iron infusions, monitoring CBC with transfusions as needed.   Home Health: None Equipment/Devices: None Discharge Condition: Stable CODE STATUS: Full Diet recommendation: Heart healthy, carbohydrate-modified.   Brief/Interim Summary: [name] is a 76 y.o. male with history of atrial fibrillation/flutter on coumadin, s/p partial gastrectomy with gastric ulcer found April 2018 who presented for progressive fatigue. He denies any evidence of GI bleeding, and has continued to follow up for outpatient IV iron and prn transfusions. He had been admitted 2 weeks prior for abdominal pain and anemia, transfused 2u PRBCs and no diagnostic procedures done. The plan was to follow up with GI (seen by Dr. Paulita Fujita). On arrival 7/29, he was found to have hgb drop to 7.4 (down from 9) which improved appropriately to 9.6 the following morning after 2u PRBCs. He continues to have no clinical bleeding, and feels improved from the transfusions. [facility], Dr. Cristina Gong, was contacted for consideration of inpatient work up. It is felt that since the patient is stable and has no symptoms of lower GI bleeding, no inpatient procedures are indicated. He will follow up with GI in the next 1 - 2 weeks.   Discharge Diagnoses:  Principal Problem:   Symptomatic anemia Active Problems:   Chronic diastolic congestive heart failure (HCC)   Type 2 diabetes mellitus (HCC)   PAF (paroxysmal atrial fibrillation), maintaining SR; CHA2DS2Vasc = 7  Symptomatic recurrent anemia: Cause is presumed chronic blood loss anemia which has caused iron deficiency anemia. BP somewhat low but improved with  transfusions, apparent baseline is in 110s/50s-60s.  - Continue PPI - Continue po iron as tolerated - After discussions of risks and benefits, will continue coumadin - Plan to follow up with [facility]. Dr. Cristina Gong to alert Dr. Paulita Fujita of the patient's admission and requirement for prompt follow up.   Paroxysmal atrial fibrillation: NSR during admission.  - Continue cardizem, flecainide and coumadin. INR therapeutic.  T2DM:  - Continue home medications  Suspected chronic bronchitis: Seen on CXR without acute findings, exam reassuring.  - PCP follow up recommended  Discharge Instructions Discharge Instructions    Discharge instructions    Complete by:  As directed    You were admitted for symptomatic anemia which has improved with transfusions. The on-call gastroenterologist from Dr. Erlinda Hong practice recommends that you follow up in the clinic in the next 1 - 2 weeks, and that no procedure is indicated at this time as an inpatient. Therefore, you are stable for discharge with the following recommendations:  - Call Dr. Erlinda Hong office, [facility], today to schedule an appointment. - Continue taking medications as you were, including protonix and iron.  - If you notice any dark black or red blood in stools, abdominal pain, or vomiting, seek medical attention right away.     Allergies as of 10/23/2016      Reactions   Flexeril [cyclobenzaprine] Other (See Comments)   Makes it hard for him to stand      Medication List    STOP taking these medications   cyclobenzaprine 5 MG tablet Commonly known as:  FLEXERIL     TAKE these medications   acetaminophen 650 MG CR tablet Commonly known as:  TYLENOL Take 650 mg by mouth 2 (two) times daily as needed for pain.   diltiazem 120 MG 24 hr capsule Commonly known as:  CARDIZEM CD Take 1 capsule (120 mg total) by mouth daily.   ferrous sulfate 325 (65 FE) MG tablet Take 325 mg by mouth daily with breakfast.   flecainide 150 MG  tablet Commonly known as:  TAMBOCOR TAKE 1 AND 1/2 TABLETS TWICE DAILY What changed:  See the new instructions.   HYDROcodone-acetaminophen 5-325 MG tablet Commonly known as:  NORCO/VICODIN Take 1 tablet by mouth 2 (two) times daily as needed for moderate pain.   Insulin Glargine 100 UNIT/ML Solostar Pen Commonly known as:  LANTUS Inject 8 Units into the skin at bedtime. What changed:  when to take this   loratadine 10 MG tablet Commonly known as:  CLARITIN Take 10 mg by mouth every evening.   metFORMIN 1000 MG tablet Commonly known as:  GLUCOPHAGE Take 1,000 mg by mouth 2 (two) times daily with a meal.   metoprolol succinate 25 MG 24 hr tablet Commonly known as:  TOPROL-XL TAKE 1/2 TABLET  (12.5 MG TOTAL) BY MOUTH DAILY.   multivitamin with minerals Tabs tablet Take 1 tablet by mouth 2 (two) times daily.   nitroGLYCERIN 0.4 MG SL tablet Commonly known as:  NITROSTAT Place 1 tablet (0.4 mg total) under the tongue every 5 (five) minutes x 3 doses as needed for chest pain.   pantoprazole 40 MG tablet Commonly known as:  PROTONIX Take 40 mg by mouth daily.   polyethylene glycol packet Commonly known as:  MIRALAX / GLYCOLAX Take 17 g by mouth 2 (two) times daily.   pravastatin 40 MG tablet Commonly known as:  PRAVACHOL TAKE 1 TABLET EVERY EVENING What changed:  See the new instructions.   senna-docusate 8.6-50 MG tablet Commonly known as:  Senokot-S Take 1 tablet by mouth 2 (two) times daily.   tamsulosin 0.4 MG Caps capsule Commonly known as:  FLOMAX Take 0.4 mg by mouth daily.   traMADol 50 MG tablet Commonly known as:  ULTRAM Take 1 tablet (50 mg total) by mouth every 6 (six) hours as needed.   warfarin 5 MG tablet Commonly known as:  COUMADIN TAKE 1 TO 1 AND 1/2 TABLETS DAILY AS DIRECTED What changed:  See the new instructions.      Follow-up Information    Bernerd Limbo, MD Follow up.   Specialty:  Family Medicine Contact information: [place] [address] [phone]        Arta Silence, MD. Schedule an appointment as soon as possible for a visit in 1 week(s).   Specialty:  Gastroenterology Contact information: [address] [place] [address] [phone]          Allergies  Allergen Reactions  . Flexeril [Cyclobenzaprine] Other (See Comments)    Makes it hard for him to stand    Consultations:  Dr. Cristina Gong, GI, by phone only 7/30.  Procedures/Studies: Dg Chest 2 View  Result Date: 10/22/2016 CLINICAL DATA:  Shortness of breath and weakness. Cough for a day. History of bronchitis. Current smoker EXAM: CHEST  2 VIEW COMPARISON:  09/28/2016 FINDINGS: Peribronchial thickening with perihilar linear opacities consistent with chronic bronchitis. Normal heart size and pulmonary vascularity. No focal airspace disease or consolidation in the lungs. No blunting of costophrenic angles. No pneumothorax. Mediastinal contours appear intact. Surgical clips in the right upper abdomen and EG junction. Degenerative changes in the  spine. IMPRESSION: Chronic bronchitic changes in the lungs. No evidence of active consolidation. Electronically Signed   By: Lucienne Capers M.D.   On: 10/22/2016 21:09   Dg Chest 2 View  Result Date: 09/28/2016 CLINICAL DATA:  Back pain EXAM: CHEST  2 VIEW COMPARISON:  Chest radiograph 07/10/2016 FINDINGS: Cardiac silhouette is unchanged. There are also unchanged prominent interstitial opacities. No focal consolidation or pulmonary edema. No pleural effusion or pneumothorax. IMPRESSION: No active cardiopulmonary disease. Electronically Signed   By: Ulyses Jarred M.D.   On: 09/28/2016 23:58   Ct Abdomen Pelvis W Contrast  Result Date: 10/01/2016 CLINICAL DATA:  Intermittent bloody stools over the past year. Left lower quadrant pain. Decreased hemoglobin. Recent left buttock abscess drainage. Partial gastrectomy secondary to chronic blood loss. Rule out  diverticulitis or diverticulosis. EXAM: CT ABDOMEN AND PELVIS WITH CONTRAST TECHNIQUE: Multidetector CT imaging of the abdomen and pelvis was performed using the standard protocol following bolus administration of intravenous contrast. CONTRAST:  157mL ISOVUE-300 IOPAMIDOL (ISOVUE-300) INJECTION 61% COMPARISON:  07/10/2016 FINDINGS: Mild to moderate motion degradation throughout. Lower chest: Clear lung bases. Mild cardiomegaly with right coronary artery atherosclerosis. Surgical changes at the gastroesophageal junction. Hepatobiliary: Normal liver. Cholecystectomy, without biliary ductal dilatation. Pancreas: Mild to moderate pancreatic atrophy, without duct dilatation or acute inflammation. Spleen: Normal in size, without focal abnormality. Adrenals/Urinary Tract: Normal adrenal glands. Lower pole right renal collecting system stone or stones on the order of 4 mm. Normal left kidney, without hydronephrosis. Normal urinary bladder. Stomach/Bowel: Status post partial gastrectomy and gastrojejunostomy. Colonic stool burden suggests constipation. Scattered colonic diverticula. No evidence of diverticulitis. Normal terminal ileum. Appendix grossly normal but suboptimally assessed evaluated secondary to motion. Normal small bowel. Vascular/Lymphatic: Aortic and branch vessel atherosclerosis. No abdominopelvic adenopathy. Reproductive: Mild prostatomegaly. Other: Subcutaneous thickening about the left gluteal crease, including on image 90/ series 3. Incompletely imaged. Musculoskeletal: Disc bulges at L3-4 and L2-3. IMPRESSION: 1. Mild-to-moderate motion degraded exam. 2.  Possible constipation. 3. Scattered diverticulosis without evidence of diverticulitis. 4. Right nephrolithiasis. 5. Coronary artery atherosclerosis. Aortic Atherosclerosis (ICD10-I70.0). 6. Nonspecific soft tissue thickening about the left gluteal crease. Likely the site of reported abscess drainage. This is incompletely imaged and should be correlated  with physical exam. Electronically Signed   By: Abigail Miyamoto M.D.   On: 10/01/2016 17:29   Subjective: Fatigue is resolved per pt. No dyspnea or chest pain or leg swelling.  Discharge Exam: BP (!) 117/55 (BP Location: Right Arm)   Pulse 75   Temp 97.8 F (36.6 C) (Oral)   Resp 18   Ht 5\' 6"  (1.676 m)   Wt 79.7 kg (175 lb 11.3 oz)   SpO2 99%   BMI 28.36 kg/m   General: Pt is alert, awake, not in acute distress Cardiovascular: RRR, no edema or JVD Respiratory: Nonlabored on room air, clear but diminished throughout. No wheezing.  Abdominal: Soft, NT, ND, bowel sounds +  Labs: Basic Metabolic Panel:  Recent Labs Lab 10/20/16 1110 10/22/16 1954 10/23/16 0448  NA 139 137 138  K 4.0 4.2 3.9  CL  --  103 105  CO2 26 24 27   GLUCOSE 101 172* 117*  BUN 15.2 15 15   CREATININE 0.8 0.76 0.78  CALCIUM 9.6 8.7* 8.3*   Liver Function Tests:  Recent Labs Lab 10/20/16 1110 10/22/16 1954  AST 33 33  ALT 42 39  ALKPHOS 85 65  BILITOT <0.22 <0.1*  PROT 6.5 5.9*  ALBUMIN 3.2* 2.9*   CBC:  Recent  Labs Lab 10/20/16 1110 10/22/16 1944 10/23/16 0448 10/23/16 1036  WBC 7.2 7.9 6.2  --   NEUTROABS 5.3  --   --   --   HGB 8.1* 7.4* 7.7* 9.6*  HCT 26.7* 24.0* 24.9* 31.2*  MCV 81* 77.7* 80.1  --   PLT 450* 402* 347  --    Cardiac Enzymes:  Recent Labs Lab 10/23/16 0448  TROPONINI <0.03   CBG:  Recent Labs Lab 10/22/16 1956 10/23/16 0744  GLUCAP 172* 126*   Urinalysis    Component Value Date/Time   COLORURINE STRAW (A) 10/22/2016 1954   APPEARANCEUR CLEAR 10/22/2016 1954   LABSPEC 1.010 10/22/2016 1954   PHURINE 7.0 10/22/2016 1954   GLUCOSEU NEGATIVE 10/22/2016 1954   HGBUR NEGATIVE 10/22/2016 [place] NEGATIVE 10/22/2016 [place] NEGATIVE 10/22/2016 1954   PROTEINUR NEGATIVE 10/22/2016 1954   UROBILINOGEN 0.2 10/09/2014 2315   NITRITE NEGATIVE 10/22/2016 1954   LEUKOCYTESUR LARGE (A) 10/22/2016 1954    Microbiology No results  found for this or any previous visit (from the past 240 hour(s)).  Time coordinating discharge: Approximately 40 minutes  Vance Gather, MD  [facility] 10/23/2016, 12:03 PM Pager [phone]

## 2016-10-24 LAB — TYPE AND SCREEN
ABO/RH(D): A NEG
ANTIBODY SCREEN: NEGATIVE
UNIT DIVISION: 0
UNIT DIVISION: 0

## 2016-10-24 LAB — BPAM RBC
BLOOD PRODUCT EXPIRATION DATE: 201808172359
Blood Product Expiration Date: 201808132359
ISSUE DATE / TIME: 201807300105
ISSUE DATE / TIME: 201807300610
UNIT TYPE AND RH: 600
Unit Type and Rh: 600

## 2016-10-25 ENCOUNTER — Ambulatory Visit: Payer: Medicare Other

## 2016-10-25 ENCOUNTER — Ambulatory Visit (INDEPENDENT_AMBULATORY_CARE_PROVIDER_SITE_OTHER): Payer: Medicare Other | Admitting: Student

## 2016-10-25 ENCOUNTER — Encounter: Payer: Self-pay | Admitting: Student

## 2016-10-25 VITALS — BP 113/67 | HR 72 | Ht 66.0 in | Wt 169.0 lb

## 2016-10-25 DIAGNOSIS — I5032 Chronic diastolic (congestive) heart failure: Secondary | ICD-10-CM

## 2016-10-25 DIAGNOSIS — M150 Primary generalized (osteo)arthritis: Secondary | ICD-10-CM | POA: Diagnosis not present

## 2016-10-25 DIAGNOSIS — Z7901 Long term (current) use of anticoagulants: Secondary | ICD-10-CM

## 2016-10-25 DIAGNOSIS — I2510 Atherosclerotic heart disease of native coronary artery without angina pectoris: Secondary | ICD-10-CM

## 2016-10-25 DIAGNOSIS — D649 Anemia, unspecified: Secondary | ICD-10-CM | POA: Diagnosis not present

## 2016-10-25 DIAGNOSIS — I480 Paroxysmal atrial fibrillation: Secondary | ICD-10-CM

## 2016-10-25 DIAGNOSIS — I10 Essential (primary) hypertension: Secondary | ICD-10-CM

## 2016-10-25 DIAGNOSIS — M4722 Other spondylosis with radiculopathy, cervical region: Secondary | ICD-10-CM | POA: Diagnosis not present

## 2016-10-25 DIAGNOSIS — Z87898 Personal history of other specified conditions: Secondary | ICD-10-CM

## 2016-10-25 MED ORDER — FLECAINIDE ACETATE 150 MG PO TABS: 225.0000 mg | ORAL_TABLET | Freq: Two times a day (BID) | ORAL | 3 refills | Status: DC

## 2016-10-25 MED ORDER — DILTIAZEM HCL ER COATED BEADS 120 MG PO CP24: 120.0000 mg | ORAL_CAPSULE | Freq: Every day | ORAL | 3 refills | Status: DC

## 2016-10-25 MED ORDER — METOPROLOL SUCCINATE ER 25 MG PO TB24: ORAL_TABLET | ORAL | 3 refills | Status: DC

## 2016-10-25 NOTE — Progress Notes (Signed)
Cardiology Office Note    Date:  10/25/2016   ID:  [name], DOB 08/04/1940, MRN [id number]  PCP:  Bernerd Limbo, MD  Cardiologist: Dr. [name]   Chief Complaint  Patient presents with  . Hospitalization Follow-up    History of Present Illness:    [name] is a 76 y.o. male with past medical history of PAF (on Coumadin and Flecaindie), chronic diastolic CHF, CAD (cath in 2012 showing minimal CAD, low-risk NST in 06/2013), HTN, HLD, and tobacco use who presents to the office today for hospital follow-up.     He was last examined by Dr. [name] in 05/2016 and reported doing well from a cardiac perspective at that time. Was continued on his current medication regimen. He has experienced multiple admissions since, with the first being in 06/2016 for a GIB with Hgb of 7.8 and positive FOBT in the setting of a supratherapeutic INR of 5.2. EGD was performed and showed signs of ulceration around the GJ anastomosis and esophagitis. He received 2 units pRBC's and was started on PPI BID therapy.Coumadin was restarted at the time of discharge.   He was again admitted from 7/8 - 10/03/2016 for abdominal pain with CT of the abdomen showing no acute findings. GI was consulted but did not perform any repeat testing in the setting of his recent endoscopy and colonoscopy but he did receive 2 units pRBC's. Presented back to [facility] ED on 7/29 for generalized weakness. Was found to have a Hgb of 7.4 and received an additional 2 units pRBC's with Hgb at 9.6 at the time of discharge on 10/23/2016.   In talking with the patient today, he denies any recent chest discomfort or dyspnea on exertion. No recent palpitations, orthopnea, PND, or lower extremity edema. He is aware his he is significantly anemic as he develops significant fatigue and weakness when this occurs. No recent melena or hematochezia. He is being followed by Gastroenterology and Hematology. Remains on Coumadin at this time.   Past Medical  History:  Diagnosis Date  . Anemia    takes Ferrous Sulfate daily  . Arthritis    "all over"  . Atrial flutter ([place])    a. 07/2010 Status post caval tricuspid isthmus ablation by Dr. Midge Aver Metoprolol daily  . Balance problem 01/2014  . CAP (community acquired pneumonia) 09/18/2014  . Cervical radiculopathy due to degenerative joint disease of spine   . COPD (chronic obstructive pulmonary disease) ([place])   . Coronary artery disease, non-occlusive    a. 03/2010 Nonocclusive disease by cath, performed for ST elevations on ECG;  b. 06/2013 Lexi MV: EF 60%, no ischemia.  . Diabetes mellitus type II    takes Metformin and Lantus daily  . Diastolic CHF, chronic ([place])    a. 12/2012 EF 55-60%, diast dysfxn, triv MR, mildly dil LA/RA.  [name] Dysrhythmia    HX OF ATRIAL IFB /FLUTTER takes Flecanide and Coumadin daily  . History of blood transfusion 1982   "when I had stomach OR"  . History of bronchitis    1998  . History of gastric ulcer   . HTN (hypertension)    takes Diltiazem daily  . Hyperlipidemia    takes Pravastatin daily  . Joint pain   . PAF (paroxysmal atrial fibrillation) ([place])    a. Recurrent after atrial flutter, currently controlled on flecainide plus diltiazem  . Pneumonia 1999  . Weakness    numbness and tingling both hands    Past Surgical History:  Procedure Laterality  Date  . ATRIAL ABLATION SURGERY  08/05/10   CTI ablation for atrial flutter by JA  . CARDIAC CATHETERIZATION  2012   nl LV function, no occlusive CAD, PAF  . CARDIOVERSION  12/07/2010    Successful direct current cardioversion with atrial fibrillation to normal sinus rhythm  . CARPAL TUNNEL RELEASE Bilateral 01/30/2014   Procedure: BILATERAL CARPAL TUNNEL RELEASE;  Surgeon: Marianna Payment, MD;  Location: [place];  Service: Orthopedics;  Laterality: Bilateral;  . CATARACT EXTRACTION W/ INTRAOCULAR LENS  IMPLANT, BILATERAL Bilateral   . COLONOSCOPY [name] 12/02/2013   Procedure: COLONOSCOPY;  Surgeon:  Irene Shipper, MD;  Location: [place];  Service: Endoscopy;  Laterality: [name];  . ESOPHAGOGASTRODUODENOSCOPY [name] 09/22/2014   Procedure: ESOPHAGOGASTRODUODENOSCOPY (EGD);  Surgeon: Ronald Lobo, MD;  Location: [facility] ENDOSCOPY;  Service: Endoscopy;  Laterality: [name];  . ESOPHAGOGASTRODUODENOSCOPY [name] 07/11/2016   Procedure: ESOPHAGOGASTRODUODENOSCOPY (EGD);  Surgeon: Doran Stabler, MD;  Location[facility] ENDOSCOPY;  Service: Endoscopy;  Laterality: [name];  . INCISION AND DRAINAGE ABSCESS / HEMATOMA OF BURSA / KNEE / THIGH Left 1998   knee  . KNEE BURSECTOMY Left 1998  . LAPAROSCOPIC CHOLECYSTECTOMY  03/2010  . NM MYOVIEW LTD  07/22/2013   Normal EF ~60%, no ischemia or infarction.  [name] PARTIAL GASTRECTOMY  1982   subtotal; "took out 30% for ulcers"  . TRANSTHORACIC ECHOCARDIOGRAM  02/16/2014   EF 60%, no RWMA. - otherwise normal  . YAG LASER APPLICATION Bilateral     Current Medications: Outpatient Medications Prior to Visit  Medication Sig Dispense Refill  . acetaminophen (TYLENOL) 650 MG CR tablet Take 650 mg by mouth 2 (two) times daily as needed for pain.    [name] HYDROcodone-acetaminophen (NORCO/VICODIN) 5-325 MG tablet Take 1 tablet by mouth 2 (two) times daily as needed for moderate pain.    . Insulin Glargine (LANTUS) 100 UNIT/ML Solostar Pen Inject 8 Units into the skin at bedtime. (Patient taking differently: Inject 8 Units into the skin 2 (two) times daily. ) 15 mL 11  . loratadine (CLARITIN) 10 MG tablet Take 10 mg by mouth every evening.     . metFORMIN (GLUCOPHAGE) 1000 MG tablet Take 1,000 mg by mouth 2 (two) times daily with a meal.     . Multiple Vitamin (MULTIVITAMIN WITH MINERALS) TABS tablet Take 1 tablet by mouth 2 (two) times daily.     . nitroGLYCERIN (NITROSTAT) 0.4 MG SL tablet Place 1 tablet (0.4 mg total) under the tongue every 5 (five) minutes x 3 doses as needed for chest pain. 25 tablet 9  . pantoprazole (PROTONIX) 40 MG tablet Take 40 mg by mouth daily.     .  polyethylene glycol (MIRALAX / GLYCOLAX) packet Take 17 g by mouth 2 (two) times daily. 14 each 0  . pravastatin (PRAVACHOL) 40 MG tablet TAKE 1 TABLET EVERY EVENING (Patient taking differently: TAKE 40 MG BY MOUTH EVERY EVENING) 90 tablet 1  . senna-docusate (SENOKOT-S) 8.6-50 MG tablet Take 1 tablet by mouth 2 (two) times daily. 60 tablet 0  . tamsulosin (FLOMAX) 0.4 MG CAPS capsule Take 0.4 mg by mouth daily.     [name] warfarin (COUMADIN) 5 MG tablet TAKE 1 TO 1 AND 1/2 TABLETS DAILY AS DIRECTED (Patient taking differently: Take 5 mg by mouth daily on Monday, Wednesday and Friday. Take 7.5 mg by mouth daily on all other days) 135 tablet 1  . diltiazem (CARDIZEM CD) 120 MG 24 hr capsule Take 1 capsule (120 mg total) by  mouth daily. 90 capsule 3  . flecainide (TAMBOCOR) 150 MG tablet TAKE 1 AND 1/2 TABLETS TWICE DAILY (Patient taking differently: TAKE 225 MG BY MOUTH TWICE DAILY) 270 tablet 3  . metoprolol succinate (TOPROL-XL) 25 MG 24 hr tablet TAKE 1/2 TABLET  (12.5 MG TOTAL) BY MOUTH DAILY. 15 tablet 0  . traMADol (ULTRAM) 50 MG tablet Take 1 tablet (50 mg total) by mouth every 6 (six) hours as needed. (Patient not taking: Reported on 10/22/2016) 15 tablet 0   No facility-administered medications prior to visit.      Allergies:   Flexeril [cyclobenzaprine]   Social History   Social History  . Marital status: Widowed    Spouse name: [name]  . Number of children: [name]  . Years of education: [name]   Social History Main Topics  . Smoking status: Current Every Day Smoker    Packs/day: 0.50    Years: 55.00    Types: Cigarettes  . Smokeless tobacco: Never Used     Comment: He's been smoking between 0.5 and 2 ppd since age 42.  [name] Alcohol use No     Comment: "quit drinking in 1986"  . Drug use: No  . Sexual activity: No   Other Topics Concern  . None   Social History Narrative   He is a widower, who recently moved to [place] back in 2011 to live closer to his daughter. He formerly  lived in [place]. He is a father 60, grandfather, and great-grandfather of 19. Unfortunately as I mentioned he's gone back to smoking. Smoking about a half pack a day now. He is not very active, but does try to get outside and walk some. He does not drink alcohol.     Family History:  The patient's family history includes Cancer in his mother; Heart attack in his brother and father.   Review of Systems:   Please see the history of present illness.     General:  No chills, fever, night sweats or weight changes. Positive for fatigue.  Cardiovascular:  No chest pain, dyspnea on exertion, edema, orthopnea, palpitations, paroxysmal nocturnal dyspnea. Dermatological: No rash, lesions/masses Respiratory: No cough, dyspnea Urologic: No hematuria, dysuria Abdominal:   No nausea, vomiting, diarrhea, bright red blood per rectum, melena, or hematemesis Neurologic:  No visual changes, wkns, changes in mental status. All other systems reviewed and are otherwise negative except as noted above.   Physical Exam:    VS:  BP 113/67   Pulse 72   Ht 5\' 6"  (1.676 m)   Wt 169 lb (76.7 kg)   BMI 27.28 kg/m    General: Well developed, well nourished Caucasian male appearing in no acute distress. Head: Normocephalic, atraumatic, sclera non-icteric, no xanthomas, nares are without discharge.  Neck: No carotid bruits. JVD not elevated.  Lungs: Respirations regular and unlabored, without wheezes or rales.  Heart: Regular rate and rhythm. No S3 or S4.  No murmur, no rubs, or gallops appreciated. Abdomen: Soft, non-tender, non-distended with normoactive bowel sounds. No hepatomegaly. No rebound/guarding. No obvious abdominal masses. Msk:  Strength and tone appear normal for age. No joint deformities or effusions. Extremities: No clubbing or cyanosis. No lower extremity edema.  Distal pedal pulses are 2+ bilaterally. Neuro: Alert and oriented X 3. Moves all extremities spontaneously. No focal deficits  noted. Psych:  Responds to questions appropriately with a normal affect. Skin: No rashes or lesions noted  Wt Readings from Last 3 Encounters:  10/25/16 169 lb (76.7 kg)  10/22/16 175 lb 11.3 oz (79.7 kg)  10/20/16 170 lb (77.1 kg)     Studies/Labs Reviewed:   EKG:  EKG is not ordered today. EKG from 10/22/2016 is reviewed which shows NSR, HR 88, with 1st degree AV block.   Recent Labs: 10/03/2016: Magnesium 1.9 10/22/2016: ALT 39 10/23/2016: BUN 15; Creatinine, Ser 0.78; Hemoglobin 9.6; Platelets 347; Potassium 3.9; Sodium 138   Lipid Panel    Component Value Date/Time   CHOL 155 07/22/2013 0750   TRIG 229 (H) 07/22/2013 0750   HDL 37 (L) 07/22/2013 0750   CHOLHDL 4.2 07/22/2013 0750   VLDL 46 (H) 07/22/2013 0750   LDLCALC 72 07/22/2013 0750    Additional studies/ records that were reviewed today include:   Echocardiogram: 03/11/2015 Study Conclusions  - Left ventricle: The cavity size was normal. Wall thickness was   increased in a pattern of moderate LVH. Systolic function was   normal. The estimated ejection fraction was in the range of 50%   to 55%. Wall motion was normal; there were no regional wall   motion abnormalities. - Mitral valve: Moderately calcified annulus. There was mild   regurgitation. - Left atrium: The atrium was moderately dilated.  Assessment:    1. PAF (paroxysmal atrial fibrillation) ([place])   2. Long term current use of anticoagulant therapy   3. Chronic diastolic congestive heart failure ([place])   4. History of chest pain   5. Essential hypertension   6. Symptomatic anemia      Plan:   In order of problems listed above:  1. Paroxysmal Atrial Fibrillation/ Long-term Use of Anticoagulation  - he has a known history of PAF, having undergone ablation in 2012. - he denies any recent palpitations or dyspnea on exertion. Has been in NSR during his recent hospitalizations by review of prior EKG's.  - continue Flecainide, Cardizem CD, and  Toprol-XL at current doses. - This patients CHA2DS2-VASc Score and unadjusted Ischemic Stroke Rate (% per year) is equal to 7.2 % stroke rate/year from a score of 5 (HTN, CAD, DM, Age (2)). He has continued on Coumadin even though he has experienced multiple admissions for symptomatic anemia. We reviewed the risks and benefits of continuing on Coumadin and he wishes to stay on anticoagulation at this time. We also spent time discussing NOAC's as one of his episodes was exacerbated by a supra-therapeutic INR. The patient's daughter was interested in him switching to Eliquis or Xarelto but he declines to switch at this time. He will discuss this further with GI and Hematology as he has follow-up with both within the month. He will contact our office if he wishes to switch to a NOAC.   2. Chronic Diastolic CHF - he does not appear volume overloaded by physical examination. - Continue current medication regimen. Importance of sodium restriction was reviewed.  3. History of Chest Pain - cath in 2012 showed minimal CAD, low-risk NST in 06/2013. - continue BB and statin therapy. No ASA secondary to the need for Coumadin.   4. HTN - BP is well-controlled at 113/67 during today's visit. - continue Cardizem CD 120mg  daily and Toprol-XL 12.5mg  daily.   5. Symptomatic Anemia - has experienced multiple admissions for this over the past 4 months. Most recent EGD in 06/2016 showed signs of ulceration around the GJ anastomosis and esophagitis. Started on PPI BID therapy. - continues to be followed by GI and Hematology. Patient is to receive IV Iron transfusions over the coming months per his report.  Medication Adjustments/Labs and Tests Ordered: Current medicines are reviewed at length with the patient today.  Concerns regarding medicines are outlined above.  Medication changes, Labs and Tests ordered today are listed in the Patient Instructions below. Patient Instructions  Continue same  medications  Your physician wants you to follow-up 05/2017 with Dr.Harding. You will receive a reminder letter in the mail two months in advance. If you don't receive a letter, please call our office to schedule the follow-up appointment.   Signed, Erma Heritage, PA-C  10/25/2016 8:17 PM    [place], [place], [place]  [address] Phone: [phone]; Fax: [phone]  [address] [place], [place] [address] Phone: [phone]

## 2016-10-25 NOTE — Patient Instructions (Addendum)
Continue same medications     Your physician wants you to follow-up 05/2017 with Dr.Harding. You will receive a reminder letter in the mail two months in advance. If you don't receive a letter, please call our office to schedule the follow-up appointment.

## 2016-11-02 DIAGNOSIS — N3281 Overactive bladder: Secondary | ICD-10-CM | POA: Diagnosis not present

## 2016-11-02 DIAGNOSIS — E785 Hyperlipidemia, unspecified: Secondary | ICD-10-CM | POA: Diagnosis not present

## 2016-11-02 DIAGNOSIS — D508 Other iron deficiency anemias: Secondary | ICD-10-CM | POA: Diagnosis not present

## 2016-11-02 DIAGNOSIS — E119 Type 2 diabetes mellitus without complications: Secondary | ICD-10-CM | POA: Diagnosis not present

## 2016-11-02 DIAGNOSIS — M542 Cervicalgia: Secondary | ICD-10-CM | POA: Diagnosis not present

## 2016-11-02 DIAGNOSIS — Z794 Long term (current) use of insulin: Secondary | ICD-10-CM | POA: Diagnosis not present

## 2016-11-03 ENCOUNTER — Ambulatory Visit (INDEPENDENT_AMBULATORY_CARE_PROVIDER_SITE_OTHER): Payer: Medicare Other | Admitting: Pharmacist

## 2016-11-03 DIAGNOSIS — I480 Paroxysmal atrial fibrillation: Secondary | ICD-10-CM

## 2016-11-03 DIAGNOSIS — M4722 Other spondylosis with radiculopathy, cervical region: Secondary | ICD-10-CM | POA: Diagnosis not present

## 2016-11-03 DIAGNOSIS — M150 Primary generalized (osteo)arthritis: Secondary | ICD-10-CM | POA: Diagnosis not present

## 2016-11-03 LAB — PROTIME-INR: INR: 2 — AB (ref <=1.1)

## 2016-11-10 ENCOUNTER — Other Ambulatory Visit: Payer: Self-pay | Admitting: Family

## 2016-11-10 ENCOUNTER — Ambulatory Visit (HOSPITAL_BASED_OUTPATIENT_CLINIC_OR_DEPARTMENT_OTHER): Payer: Medicare Other

## 2016-11-10 VITALS — BP 91/52 | HR 68 | Temp 98.5°F | Resp 18

## 2016-11-10 DIAGNOSIS — D508 Other iron deficiency anemias: Secondary | ICD-10-CM

## 2016-11-10 DIAGNOSIS — D649 Anemia, unspecified: Secondary | ICD-10-CM

## 2016-11-10 MED ORDER — SODIUM CHLORIDE 0.9 % IV SOLN
510.0000 mg | Freq: Once | INTRAVENOUS | Status: AC
  Administered 2016-11-10: 510 mg via INTRAVENOUS
  Filled 2016-11-10: qty 17

## 2016-11-10 MED ORDER — SODIUM CHLORIDE 0.9 % IV SOLN
Freq: Once | INTRAVENOUS | Status: AC
  Administered 2016-11-10: 09:00:00 via INTRAVENOUS

## 2016-11-10 NOTE — Patient Instructions (Signed)

## 2016-11-11 IMAGING — CT CT RENAL STONE PROTOCOL
2 of 3 series · 16 of 46 positions shown, 18 images · non-contrast
Comparison: Abdominal CT dated 09/22/2014

CLINICAL DATA: 75-year-old male with left flank pain.

EXAM:
CT ABDOMEN AND PELVIS WITHOUT CONTRAST
TECHNIQUE: Multidetector CT imaging of the abdomen and pelvis was performed
following the standard protocol without IV contrast.

[Series 3: coronal · coronal · 0.74mm/px · 3 of 160 slices shown]
[im 54/160  soft-tissue]
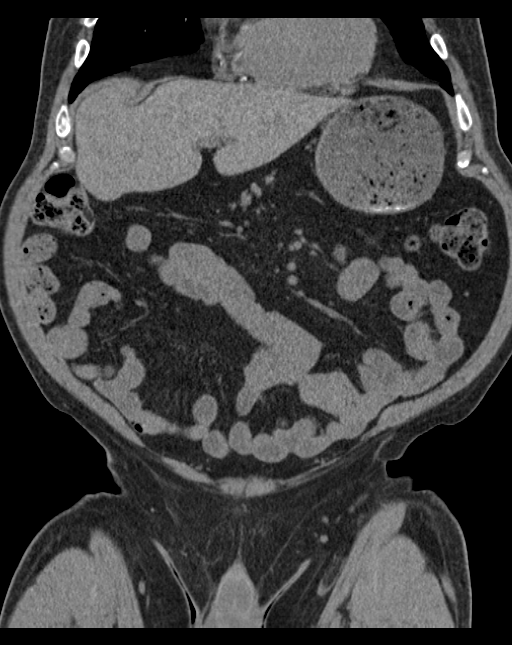
[im 71/160  soft-tissue]
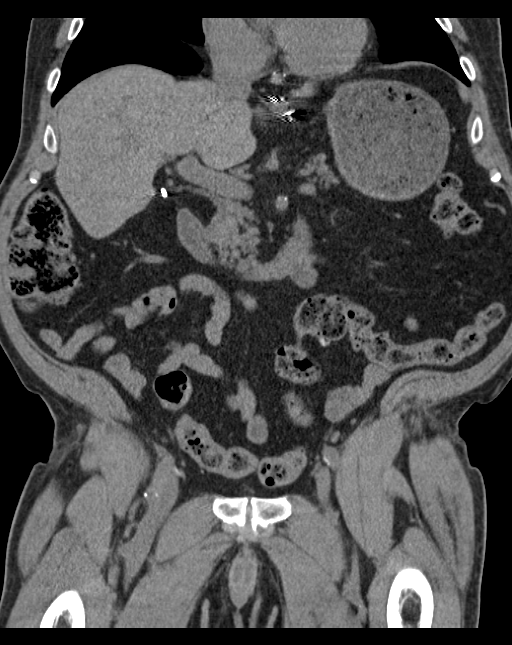
[im 89/160  soft-tissue]
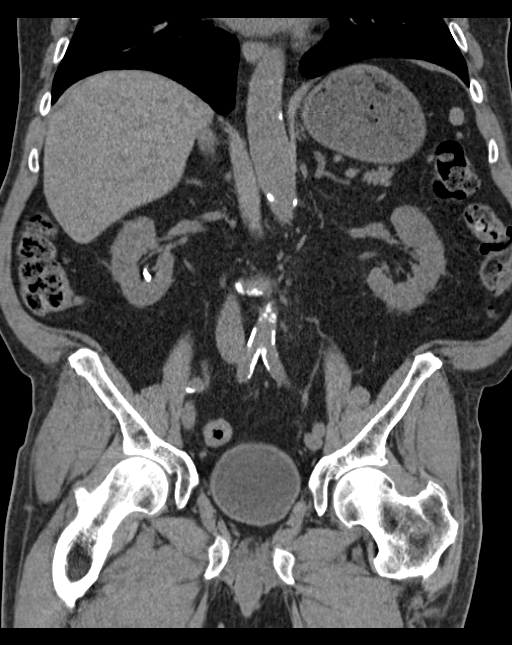

[Series 6: lung · axial · 0.87mm/px · z∈[+1347,+1455]mm · 13 of 62 slices shown, 15 images]
[im 4/62  soft-tissue]
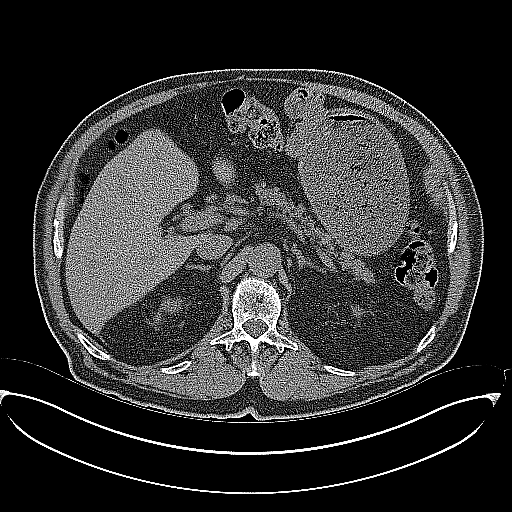
[im 4/62  bone]
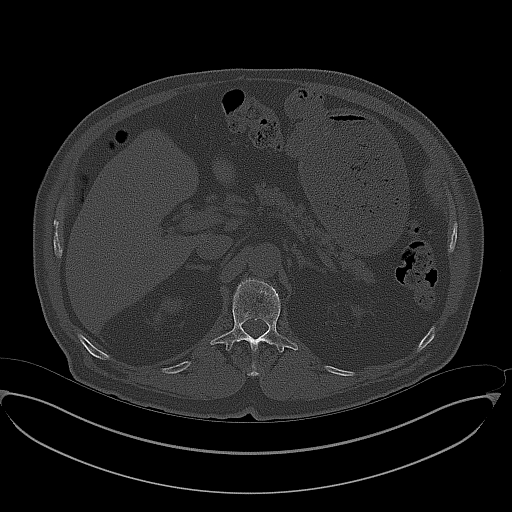
[im 8/62  soft-tissue]
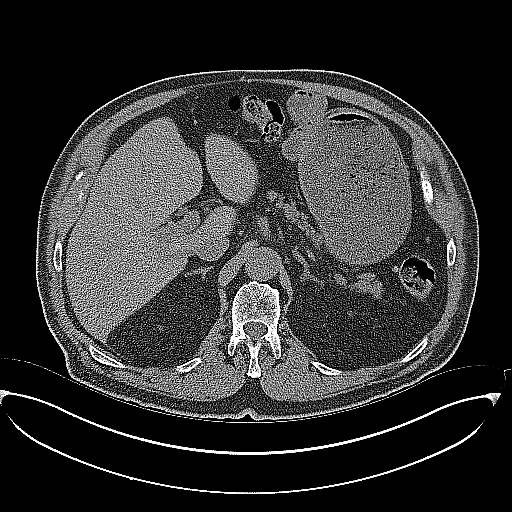
[im 12/62  soft-tissue]
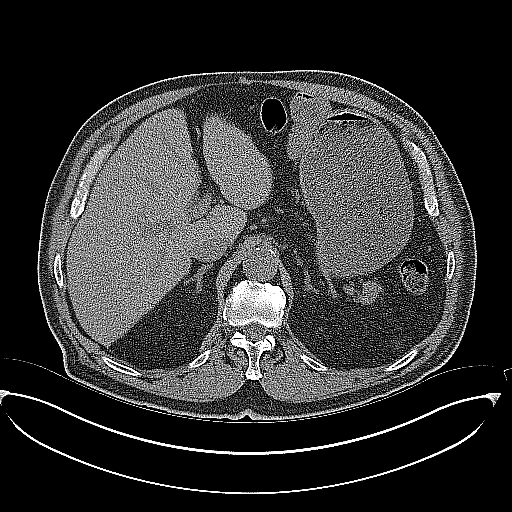
[im 18/62  soft-tissue]
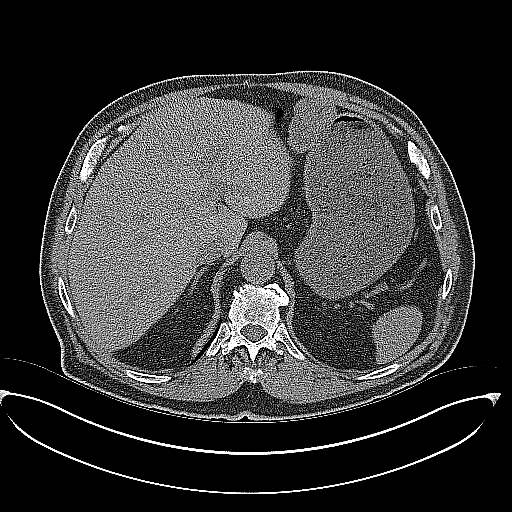
[im 22/62  soft-tissue]
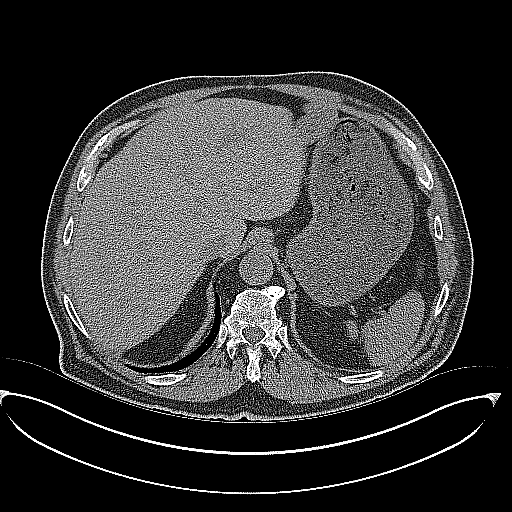
[im 26/62  soft-tissue]
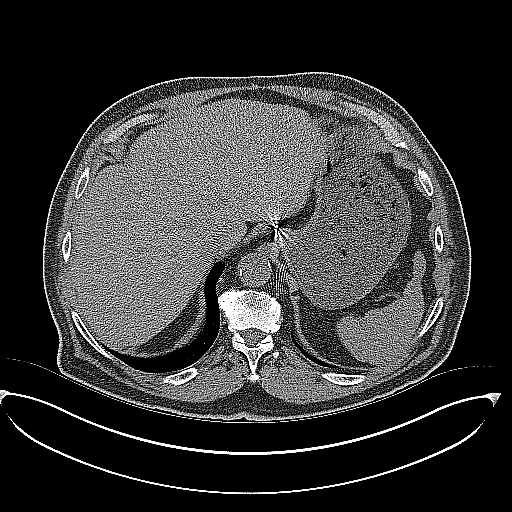
[im 32/62  soft-tissue]
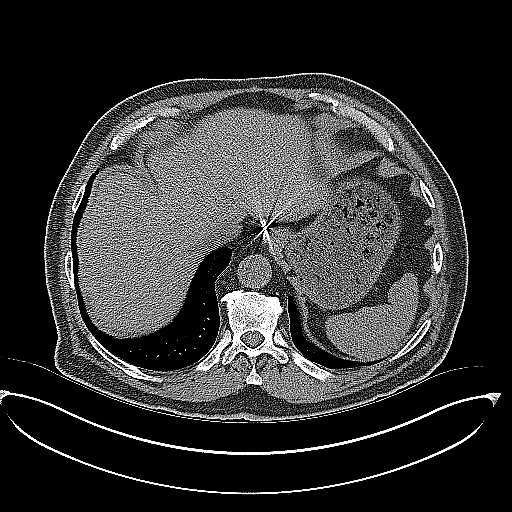
[im 36/62  soft-tissue]
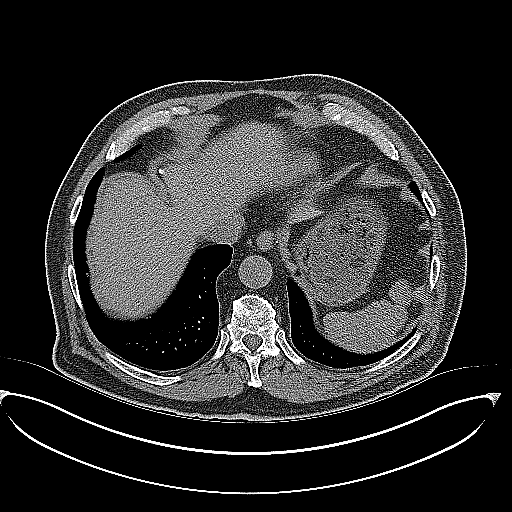
[im 40/62  soft-tissue]
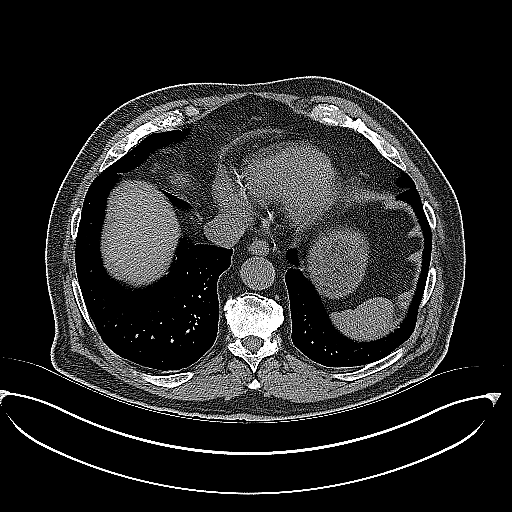
[im 40/62  bone]
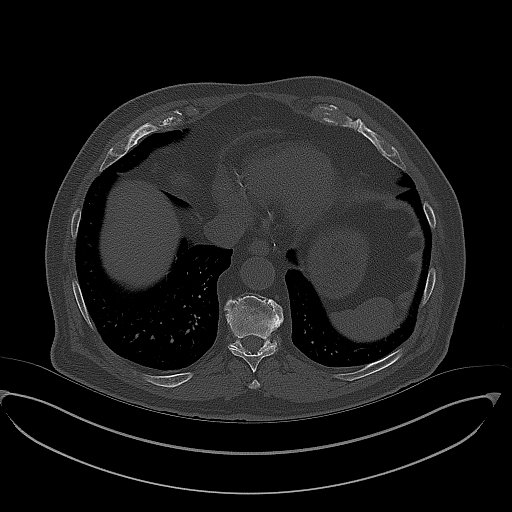
[im 44/62  soft-tissue]
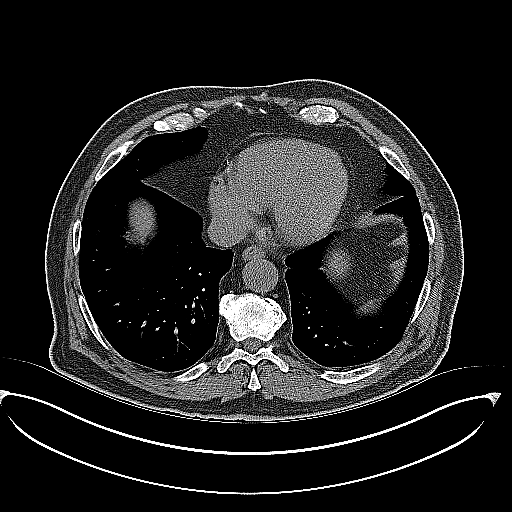
[im 50/62  soft-tissue]
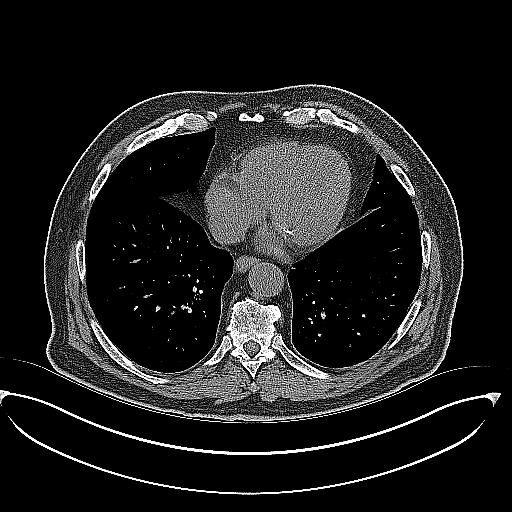
[im 54/62  soft-tissue]
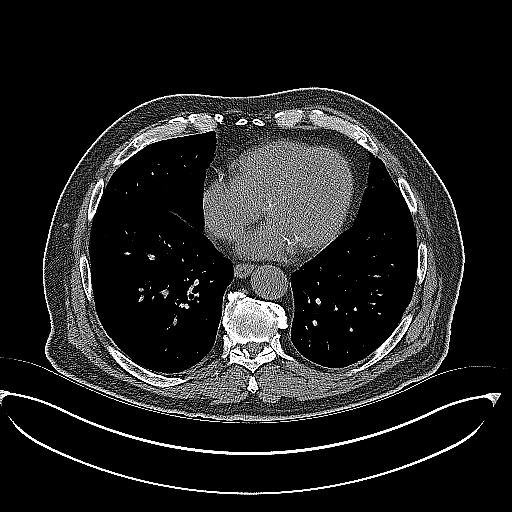
[im 58/62  soft-tissue]
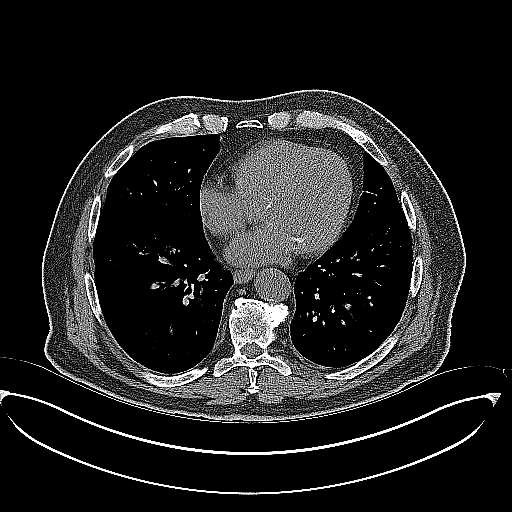

[16 of 46 positions shown; findings below may reference images not displayed]

FINDINGS: Evaluation of this exam is limited in the absence of intravenous
contrast.

The visualized lung bases are clear. There is coronary vascular
calcification. No intra-abdominal free air or free fluid.

Cholecystectomy. The liver, pancreas, spleen, adrenal glands appear
unremarkable. Vascular calcification versus small nonobstructing
right renal inferior pole calculi measuring up to 6 mm. No
hydronephrosis. The left kidney is unremarkable. The visualized
ureters and urinary bladder appear unremarkable. The prostate and
seminal vesicles are grossly unremarkable.

Surgical clips noted at the gastroesophageal junction. There is
postsurgical changes of the stomach likely related to prior
antrectomy and gastrojejunostomy. There is no evidence of bowel
obstruction or active inflammation. Moderate stool noted throughout
the colon. There scattered colonic diverticula without active
inflammatory changes. The appendix is unremarkable.

There is mild moderate aortoiliac atherosclerotic disease. There is
a 2.5 cm infrarenal abdominal aortic ectasia. Evaluation of the
vasculature is limited on this noncontrast study. No portal venous
gas identified. There is no adenopathy. There is a midline vertical
anterior pelvic wall incisional scar. The abdominal wall soft
tissues are otherwise unremarkable. There is degenerative changes of
the spine with anterior osteophyte. No acute fracture.
IMPRESSION: Vascular calcification versus nonobstructing right renal inferior
pole calculi. No hydronephrosis.

No evidence of bowel obstruction or inflammation.  Normal appendix.

Colonic diverticulosis.

## 2016-11-17 ENCOUNTER — Ambulatory Visit (INDEPENDENT_AMBULATORY_CARE_PROVIDER_SITE_OTHER): Payer: Medicare Other | Admitting: Pharmacist Clinician (PhC)/ Clinical Pharmacy Specialist

## 2016-11-17 DIAGNOSIS — Z7901 Long term (current) use of anticoagulants: Secondary | ICD-10-CM

## 2016-11-17 DIAGNOSIS — I480 Paroxysmal atrial fibrillation: Secondary | ICD-10-CM | POA: Diagnosis not present

## 2016-11-17 DIAGNOSIS — I2510 Atherosclerotic heart disease of native coronary artery without angina pectoris: Secondary | ICD-10-CM | POA: Diagnosis not present

## 2016-11-17 LAB — POCT INR: INR: 1.5

## 2016-11-18 ENCOUNTER — Telehealth: Payer: Self-pay | Admitting: Pharmacist Clinician (PhC)/ Clinical Pharmacy Specialist

## 2016-11-18 DIAGNOSIS — I482 Chronic atrial fibrillation, unspecified: Secondary | ICD-10-CM

## 2016-11-18 NOTE — Telephone Encounter (Signed)
Patient will need INR in 2 weeks, has appt with [facility] that day.  Will have them draw INR with their labs and forward results to [place].

## 2016-11-21 ENCOUNTER — Other Ambulatory Visit: Payer: Self-pay | Admitting: Family

## 2016-11-21 DIAGNOSIS — I480 Paroxysmal atrial fibrillation: Secondary | ICD-10-CM

## 2016-11-23 DIAGNOSIS — D649 Anemia, unspecified: Secondary | ICD-10-CM | POA: Diagnosis not present

## 2016-11-28 ENCOUNTER — Emergency Department (HOSPITAL_COMMUNITY): Payer: Medicare Other

## 2016-11-28 ENCOUNTER — Inpatient Hospital Stay (HOSPITAL_COMMUNITY)
Admission: EM | Admit: 2016-11-28 | Discharge: 2016-11-30 | DRG: 378 | Disposition: A | Discharge status: Home / Self Care | Payer: Medicare Other | Attending: Internal Medicine | Admitting: Internal Medicine

## 2016-11-28 ENCOUNTER — Encounter (HOSPITAL_COMMUNITY): Payer: Self-pay

## 2016-11-28 DIAGNOSIS — Z903 Acquired absence of stomach [part of]: Secondary | ICD-10-CM

## 2016-11-28 DIAGNOSIS — I4892 Unspecified atrial flutter: Secondary | ICD-10-CM | POA: Diagnosis not present

## 2016-11-28 DIAGNOSIS — E669 Obesity, unspecified: Secondary | ICD-10-CM | POA: Diagnosis present

## 2016-11-28 DIAGNOSIS — Z809 Family history of malignant neoplasm, unspecified: Secondary | ICD-10-CM

## 2016-11-28 DIAGNOSIS — I2510 Atherosclerotic heart disease of native coronary artery without angina pectoris: Secondary | ICD-10-CM | POA: Diagnosis not present

## 2016-11-28 DIAGNOSIS — D62 Acute posthemorrhagic anemia: Secondary | ICD-10-CM | POA: Diagnosis not present

## 2016-11-28 DIAGNOSIS — K922 Gastrointestinal hemorrhage, unspecified: Secondary | ICD-10-CM | POA: Diagnosis not present

## 2016-11-28 DIAGNOSIS — I110 Hypertensive heart disease with heart failure: Secondary | ICD-10-CM | POA: Diagnosis present

## 2016-11-28 DIAGNOSIS — M5412 Radiculopathy, cervical region: Secondary | ICD-10-CM | POA: Diagnosis present

## 2016-11-28 DIAGNOSIS — I10 Essential (primary) hypertension: Secondary | ICD-10-CM | POA: Diagnosis present

## 2016-11-28 DIAGNOSIS — I480 Paroxysmal atrial fibrillation: Secondary | ICD-10-CM | POA: Diagnosis present

## 2016-11-28 DIAGNOSIS — F17210 Nicotine dependence, cigarettes, uncomplicated: Secondary | ICD-10-CM | POA: Diagnosis present

## 2016-11-28 DIAGNOSIS — Z7901 Long term (current) use of anticoagulants: Secondary | ICD-10-CM

## 2016-11-28 DIAGNOSIS — D500 Iron deficiency anemia secondary to blood loss (chronic): Secondary | ICD-10-CM

## 2016-11-28 DIAGNOSIS — I5032 Chronic diastolic (congestive) heart failure: Secondary | ICD-10-CM | POA: Diagnosis present

## 2016-11-28 DIAGNOSIS — Z6827 Body mass index (BMI) 27.0-27.9, adult: Secondary | ICD-10-CM

## 2016-11-28 DIAGNOSIS — D649 Anemia, unspecified: Secondary | ICD-10-CM | POA: Diagnosis not present

## 2016-11-28 DIAGNOSIS — Z888 Allergy status to other drugs, medicaments and biological substances status: Secondary | ICD-10-CM

## 2016-11-28 DIAGNOSIS — R0602 Shortness of breath: Secondary | ICD-10-CM | POA: Diagnosis not present

## 2016-11-28 DIAGNOSIS — Z5181 Encounter for therapeutic drug level monitoring: Secondary | ICD-10-CM

## 2016-11-28 DIAGNOSIS — Z794 Long term (current) use of insulin: Secondary | ICD-10-CM

## 2016-11-28 DIAGNOSIS — Z8249 Family history of ischemic heart disease and other diseases of the circulatory system: Secondary | ICD-10-CM

## 2016-11-28 DIAGNOSIS — Z9049 Acquired absence of other specified parts of digestive tract: Secondary | ICD-10-CM

## 2016-11-28 DIAGNOSIS — Z9842 Cataract extraction status, left eye: Secondary | ICD-10-CM

## 2016-11-28 DIAGNOSIS — J449 Chronic obstructive pulmonary disease, unspecified: Secondary | ICD-10-CM | POA: Diagnosis present

## 2016-11-28 DIAGNOSIS — K284 Chronic or unspecified gastrojejunal ulcer with hemorrhage: Secondary | ICD-10-CM | POA: Diagnosis not present

## 2016-11-28 DIAGNOSIS — Z961 Presence of intraocular lens: Secondary | ICD-10-CM | POA: Diagnosis present

## 2016-11-28 DIAGNOSIS — R05 Cough: Secondary | ICD-10-CM | POA: Diagnosis not present

## 2016-11-28 DIAGNOSIS — Z9841 Cataract extraction status, right eye: Secondary | ICD-10-CM

## 2016-11-28 DIAGNOSIS — E119 Type 2 diabetes mellitus without complications: Secondary | ICD-10-CM

## 2016-11-28 DIAGNOSIS — Z8711 Personal history of peptic ulcer disease: Secondary | ICD-10-CM

## 2016-11-28 DIAGNOSIS — R531 Weakness: Secondary | ICD-10-CM | POA: Diagnosis not present

## 2016-11-28 DIAGNOSIS — E785 Hyperlipidemia, unspecified: Secondary | ICD-10-CM | POA: Diagnosis present

## 2016-11-28 DIAGNOSIS — Z8673 Personal history of transient ischemic attack (TIA), and cerebral infarction without residual deficits: Secondary | ICD-10-CM

## 2016-11-28 DIAGNOSIS — F17200 Nicotine dependence, unspecified, uncomplicated: Secondary | ICD-10-CM | POA: Diagnosis present

## 2016-11-28 DIAGNOSIS — R5381 Other malaise: Secondary | ICD-10-CM | POA: Diagnosis not present

## 2016-11-28 DIAGNOSIS — K210 Gastro-esophageal reflux disease with esophagitis: Secondary | ICD-10-CM | POA: Diagnosis present

## 2016-11-28 LAB — URINALYSIS, ROUTINE W REFLEX MICROSCOPIC
Bilirubin Urine: NEGATIVE
Glucose, UA: NEGATIVE mg/dL
Hgb urine dipstick: NEGATIVE
KETONES UR: NEGATIVE mg/dL
Nitrite: NEGATIVE
PH: 7 (ref 5.0–8.0)
PROTEIN: NEGATIVE mg/dL
Specific Gravity, Urine: 1.013 (ref 1.005–1.030)

## 2016-11-28 LAB — COMPREHENSIVE METABOLIC PANEL
ALBUMIN: 3 g/dL — AB (ref 3.5–5.0)
ALK PHOS: 65 U/L (ref 38–126)
ALT: 31 U/L (ref 17–63)
AST: 24 U/L (ref 15–41)
Anion gap: 9 (ref 5–15)
BUN: 17 mg/dL (ref 6–20)
CALCIUM: 8.8 mg/dL — AB (ref 8.9–10.3)
CO2: 27 mmol/L (ref 22–32)
CREATININE: 0.91 mg/dL (ref 0.61–1.24)
Chloride: 103 mmol/L (ref 101–111)
GFR calc Af Amer: >60 mL/min (ref >60)
GFR calc non Af Amer: >60 mL/min (ref >60)
Glucose, Bld: 110 mg/dL — ABNORMAL HIGH (ref 65–99)
Potassium: 4.2 mmol/L (ref 3.5–5.1)
SODIUM: 139 mmol/L (ref 135–145)
TOTAL PROTEIN: 5.6 g/dL — AB (ref 6.5–8.1)
Total Bilirubin: 0.2 mg/dL — ABNORMAL LOW (ref 0.3–1.2)

## 2016-11-28 LAB — CBC WITH DIFFERENTIAL/PLATELET
Basophils Absolute: 0 10*3/uL (ref 0.0–0.1)
Basophils Relative: 1 %
EOS ABS: 0.1 10*3/uL (ref 0.0–0.7)
Eosinophils Relative: 2 %
HCT: 23.1 % — ABNORMAL LOW (ref 39.0–52.0)
HEMOGLOBIN: 6.9 g/dL — AB (ref 13.0–17.0)
LYMPHS ABS: 0.8 10*3/uL (ref 0.7–4.0)
Lymphocytes Relative: 17 %
MCH: 25.1 pg — AB (ref 26.0–34.0)
MCHC: 29.9 g/dL — ABNORMAL LOW (ref 30.0–36.0)
MCV: 84 fL (ref 78.0–100.0)
Monocytes Absolute: 0.6 10*3/uL (ref 0.1–1.0)
Monocytes Relative: 13 %
NEUTROS PCT: 68 %
Neutro Abs: 3.2 10*3/uL (ref 1.7–7.7)
Platelets: 259 10*3/uL (ref 150–400)
RBC: 2.75 MIL/uL — AB (ref 4.22–5.81)
RDW: 20.2 % — ABNORMAL HIGH (ref 11.5–15.5)
WBC: 4.7 10*3/uL (ref 4.0–10.5)

## 2016-11-28 LAB — PREPARE RBC (CROSSMATCH)

## 2016-11-28 LAB — PROTIME-INR
INR: 1.68
Prothrombin Time: 19.7 seconds — ABNORMAL HIGH (ref 11.4–15.2)

## 2016-11-28 LAB — I-STAT TROPONIN, ED: TROPONIN I, POC: 0 ng/mL (ref 0.00–0.08)

## 2016-11-28 LAB — POC OCCULT BLOOD, ED: FECAL OCCULT BLD: POSITIVE — AB

## 2016-11-28 MED ORDER — HYDROCODONE-ACETAMINOPHEN 5-325 MG PO TABS
1.0000 | ORAL_TABLET | Freq: Two times a day (BID) | ORAL | Status: DC | PRN
  Administered 2016-11-30: 1 via ORAL
  Filled 2016-11-28: qty 1

## 2016-11-28 MED ORDER — OXYBUTYNIN CHLORIDE ER 5 MG PO TB24
5.0000 mg | ORAL_TABLET | Freq: Every day | ORAL | Status: DC
  Administered 2016-11-29 – 2016-11-30 (×2): 5 mg via ORAL
  Filled 2016-11-28 (×2): qty 1

## 2016-11-28 MED ORDER — INSULIN ASPART 100 UNIT/ML ~~LOC~~ SOLN
0.0000 [IU] | Freq: Three times a day (TID) | SUBCUTANEOUS | Status: DC
  Administered 2016-11-30: 2 [IU] via SUBCUTANEOUS

## 2016-11-28 MED ORDER — INSULIN GLARGINE 100 UNIT/ML ~~LOC~~ SOLN
8.0000 [IU] | Freq: Two times a day (BID) | SUBCUTANEOUS | Status: DC
  Administered 2016-11-29 – 2016-11-30 (×3): 8 [IU] via SUBCUTANEOUS
  Filled 2016-11-28 (×5): qty 0.08

## 2016-11-28 MED ORDER — IPRATROPIUM-ALBUTEROL 0.5-2.5 (3) MG/3ML IN SOLN
3.0000 mL | Freq: Four times a day (QID) | RESPIRATORY_TRACT | Status: DC
  Administered 2016-11-29 (×2): 3 mL via RESPIRATORY_TRACT
  Filled 2016-11-28 (×2): qty 3

## 2016-11-28 MED ORDER — METOPROLOL SUCCINATE ER 25 MG PO TB24
12.5000 mg | ORAL_TABLET | Freq: Every day | ORAL | Status: DC
  Administered 2016-11-29 – 2016-11-30 (×2): 12.5 mg via ORAL
  Filled 2016-11-28 (×3): qty 1

## 2016-11-28 MED ORDER — ACETAMINOPHEN 325 MG PO TABS: 650.0000 mg | ORAL_TABLET | Freq: Four times a day (QID) | ORAL | Status: DC | PRN

## 2016-11-28 MED ORDER — SODIUM CHLORIDE 0.9 % IV SOLN
Freq: Once | INTRAVENOUS | Status: AC
  Administered 2016-11-29: 01:00:00 via INTRAVENOUS

## 2016-11-28 MED ORDER — SODIUM CHLORIDE 0.9 % IV SOLN
INTRAVENOUS | Status: DC
  Administered 2016-11-29 (×2): via INTRAVENOUS

## 2016-11-28 MED ORDER — PRAVASTATIN SODIUM 40 MG PO TABS
40.0000 mg | ORAL_TABLET | Freq: Every evening | ORAL | Status: DC
  Administered 2016-11-29 – 2016-11-30 (×2): 40 mg via ORAL
  Filled 2016-11-28 (×2): qty 1

## 2016-11-28 MED ORDER — ONDANSETRON HCL 4 MG PO TABS: 4.0000 mg | ORAL_TABLET | Freq: Four times a day (QID) | ORAL | Status: DC | PRN

## 2016-11-28 MED ORDER — ALBUTEROL SULFATE (2.5 MG/3ML) 0.083% IN NEBU: 2.5000 mg | INHALATION_SOLUTION | RESPIRATORY_TRACT | Status: DC | PRN

## 2016-11-28 MED ORDER — LORATADINE 10 MG PO TABS
10.0000 mg | ORAL_TABLET | Freq: Every evening | ORAL | Status: DC
  Administered 2016-11-29 – 2016-11-30 (×2): 10 mg via ORAL
  Filled 2016-11-28 (×2): qty 1

## 2016-11-28 MED ORDER — ONDANSETRON HCL 4 MG/2ML IJ SOLN: 4.0000 mg | Freq: Four times a day (QID) | INTRAMUSCULAR | Status: DC | PRN

## 2016-11-28 MED ORDER — DILTIAZEM HCL ER COATED BEADS 120 MG PO CP24
120.0000 mg | ORAL_CAPSULE | Freq: Every day | ORAL | Status: DC
  Administered 2016-11-29 – 2016-11-30 (×2): 120 mg via ORAL
  Filled 2016-11-28 (×2): qty 1

## 2016-11-28 MED ORDER — PANTOPRAZOLE SODIUM 40 MG IV SOLR
40.0000 mg | Freq: Two times a day (BID) | INTRAVENOUS | Status: DC
  Administered 2016-11-29 – 2016-11-30 (×4): 40 mg via INTRAVENOUS
  Filled 2016-11-28 (×4): qty 40

## 2016-11-28 MED ORDER — TAMSULOSIN HCL 0.4 MG PO CAPS
0.4000 mg | ORAL_CAPSULE | Freq: Every day | ORAL | Status: DC
  Administered 2016-11-29 – 2016-11-30 (×2): 0.4 mg via ORAL
  Filled 2016-11-28 (×2): qty 1

## 2016-11-28 MED ORDER — FLECAINIDE ACETATE 50 MG PO TABS
225.0000 mg | ORAL_TABLET | Freq: Two times a day (BID) | ORAL | Status: DC
  Administered 2016-11-29 – 2016-11-30 (×3): 225 mg via ORAL
  Filled 2016-11-28 (×5): qty 1

## 2016-11-28 MED ORDER — ACETAMINOPHEN 650 MG RE SUPP
650.0000 mg | Freq: Four times a day (QID) | RECTAL | Status: DC | PRN
Start: 2016-11-28 — End: 2016-11-30

## 2016-11-28 MED ORDER — NICOTINE 14 MG/24HR TD PT24
14.0000 mg | MEDICATED_PATCH | Freq: Every day | TRANSDERMAL | Status: DC
  Administered 2016-11-29 – 2016-11-30 (×2): 14 mg via TRANSDERMAL
  Filled 2016-11-28 (×2): qty 1

## 2016-11-28 NOTE — ED Provider Notes (Signed)
[facility] DEPT Provider Note   CSN: [id number] Arrival date & time: 11/28/16  1933     History   Chief Complaint Chief Complaint  Patient presents with  . Weakness    HPI [name] is a 76 y.o. male.  Patient is a 76 year old male with a history of atrial flutter on Coumadin therapy, coronary artery disease, CHF, diabetes, anemia, GI bleed resulting in blood transfusion presenting today with abrupt onset of generalized weakness. Patient states he was doing bili well until about 1:00 this afternoon when he started to feel generally weak with tingling in his hands and feet. He states he felt slightly faint but that resolved after laying down for 20 minutes. He denies any chest pain, shortness of breath, abdominal pain or change in the color of his stool. He was able to stand and walk here without feeling faint. He has not had any recent change in his medications but does still take Coumadin. He does say that he was having some issues with his urine and his doctor told him he may have an infection but as far as he knows he is not on an antibiotic at this time.He denies any fever.   The history is provided by the patient.  Weakness  This is a recurrent problem. The current episode started 3 to 5 hours ago. The problem has not changed since onset.There was no focality noted. There has been no fever. Pertinent negatives include no shortness of breath, no chest pain, no vomiting, no altered mental status and no confusion.    Past Medical History:  Diagnosis Date  . Anemia    takes Ferrous Sulfate daily  . Arthritis    "all over"  . Atrial flutter ([place])    a. 07/2010 Status post caval tricuspid isthmus ablation by Dr. Midge Aver Metoprolol daily  . Balance problem 01/2014  . CAP (community acquired pneumonia) 09/18/2014  . Cervical radiculopathy due to degenerative joint disease of spine   . COPD (chronic obstructive pulmonary disease) ([place])   . Coronary artery disease,  non-occlusive    a. 03/2010 Nonocclusive disease by cath, performed for ST elevations on ECG;  b. 06/2013 Lexi MV: EF 60%, no ischemia.  . Diabetes mellitus type II    takes Metformin and Lantus daily  . Diastolic CHF, chronic ([place])    a. 12/2012 EF 55-60%, diast dysfxn, triv MR, mildly dil LA/RA.  [name] Dysrhythmia    HX OF ATRIAL IFB /FLUTTER takes Flecanide and Coumadin daily  . History of blood transfusion 1982   "when I had stomach OR"  . History of bronchitis    1998  . History of gastric ulcer   . HTN (hypertension)    takes Diltiazem daily  . Hyperlipidemia    takes Pravastatin daily  . Joint pain   . PAF (paroxysmal atrial fibrillation) ([place])    a. Recurrent after atrial flutter, currently controlled on flecainide plus diltiazem  . Pneumonia 1999  . Weakness    numbness and tingling both hands    Patient Active Problem List   Diagnosis Date Noted  . Acute blood loss anemia 10/02/2016  . Abdominal pain 10/02/2016  . Constipation 10/02/2016  . Left lower quadrant pain   . Coagulopathy ([place])   . Heme positive stool   . Symptomatic anemia 07/10/2016  . Syncope 05/15/2015  . Cervical disc disease 05/15/2015  . Abnormal urinalysis 05/15/2015  . Anemia 05/15/2015  . History of CVA (cerebrovascular accident) 04/03/2014  . Near syncope 02/15/2014  .  Pre-syncope 02/15/2014  . Benign neoplasm of rectum and anal canal 12/02/2013  . GI bleed 11/30/2013  . Tobacco use disorder 11/30/2013  . PAF (paroxysmal atrial fibrillation), maintaining SR; CHA2DS2Vasc = 7 08/15/2013  . Anticoagulation goal of INR 2 to 3, for PAF - CHA2DS2Vasc = 7 08/15/2013  . Type 2 diabetes mellitus ([place]) 01/22/2013  . Tobacco abuse counseling 12/15/2012  . Hyperlipidemia   . Obesity (BMI 30-39.9) 12/14/2012  . Atrial flutter - status post CTI ablation 07/29/2010  . Essential hypertension 07/29/2010  . Chronic diastolic congestive heart failure ([place]) 07/29/2010  . Coronary artery disease, non-occlusive  07/29/2010    Past Surgical History:  Procedure Laterality Date  . ATRIAL ABLATION SURGERY  08/05/10   CTI ablation for atrial flutter by JA  . CARDIAC CATHETERIZATION  2012   nl LV function, no occlusive CAD, PAF  . CARDIOVERSION  12/07/2010    Successful direct current cardioversion with atrial fibrillation to normal sinus rhythm  . CARPAL TUNNEL RELEASE Bilateral 01/30/2014   Procedure: BILATERAL CARPAL TUNNEL RELEASE;  Surgeon: Marianna Payment, MD;  Location: [facility];  Service: Orthopedics;  Laterality: Bilateral;  . CATARACT EXTRACTION W/ INTRAOCULAR LENS  IMPLANT, BILATERAL Bilateral   . COLONOSCOPY [name] 12/02/2013   Procedure: COLONOSCOPY;  Surgeon: Irene Shipper, MD;  Location: [facility];  Service: Endoscopy;  Laterality: [name];  . ESOPHAGOGASTRODUODENOSCOPY [name] 09/22/2014   Procedure: ESOPHAGOGASTRODUODENOSCOPY (EGD);  Surgeon: Ronald Lobo, MD;  Location: [facility] ENDOSCOPY;  Service: Endoscopy;  Laterality: [name];  . ESOPHAGOGASTRODUODENOSCOPY [name] 07/11/2016   Procedure: ESOPHAGOGASTRODUODENOSCOPY (EGD);  Surgeon: Doran Stabler, MD;  Location: [facility] ENDOSCOPY;  Service: Endoscopy;  Laterality: [name];  . INCISION AND DRAINAGE ABSCESS / HEMATOMA OF BURSA / KNEE / THIGH Left 1998   knee  . KNEE BURSECTOMY Left 1998  . LAPAROSCOPIC CHOLECYSTECTOMY  03/2010  . NM MYOVIEW LTD  07/22/2013   Normal EF ~60%, no ischemia or infarction.  [name] PARTIAL GASTRECTOMY  1982   subtotal; "took out 30% for ulcers"  . TRANSTHORACIC ECHOCARDIOGRAM  02/16/2014   EF 60%, no RWMA. - otherwise normal  . YAG LASER APPLICATION Bilateral        Home Medications    Prior to Admission medications   Medication Sig Start Date End Date Taking? Authorizing Provider  acetaminophen (TYLENOL) 650 MG CR tablet Take 650 mg by mouth 2 (two) times daily as needed for pain.   Yes [provider]  diltiazem (CARDIZEM CD) 120 MG 24 hr capsule Take 1 capsule (120 mg total) by mouth daily. 10/25/16  Yes Strader, Fransisco Hertz, PA-C  flecainide (TAMBOCOR) 150 MG tablet Take 1.5 tablets (225 mg total) by mouth 2 (two) times daily. 10/25/16  Yes Strader, Fransisco Hertz, PA-C  HYDROcodone-acetaminophen (NORCO/VICODIN) 5-325 MG tablet Take 1 tablet by mouth 2 (two) times daily as needed for moderate pain.   Yes [provider]  Insulin Glargine (LANTUS) 100 UNIT/ML Solostar Pen Inject 8 Units into the skin at bedtime. Patient taking differently: Inject 8 Units into the skin 2 (two) times daily.  07/12/16  Yes Holley Raring, MD  loratadine (CLARITIN) 10 MG tablet Take 10 mg by mouth every evening.    Yes [provider]  metFORMIN (GLUCOPHAGE) 1000 MG tablet Take 1,000 mg by mouth 2 (two) times daily with a meal.    Yes [provider]  metoprolol succinate (TOPROL-XL) 25 MG 24 hr tablet TAKE 1/2 TABLET  (12.5 MG TOTAL) BY MOUTH DAILY. 10/25/16  Yes Strader, [place] M, PA-C  Multiple Vitamin (MULTIVITAMIN WITH MINERALS) TABS tablet Take 1 tablet by mouth 2 (two) times daily.    Yes [provider]  nitroGLYCERIN (NITROSTAT) 0.4 MG SL tablet Place 1 tablet (0.4 mg total) under the tongue every 5 (five) minutes x 3 doses as needed for chest pain. 11/19/15  Yes Kilroy, Luke K, PA-C  oxybutynin (DITROPAN-XL) 5 MG 24 hr tablet Take 5 mg by mouth daily. 11/02/16 11/02/17 Yes [provider]  pantoprazole (PROTONIX) 40 MG tablet Take 40 mg by mouth daily.  10/06/16  Yes [provider]  polyethylene glycol (MIRALAX / GLYCOLAX) packet Take 17 g by mouth 2 (two) times daily. 10/03/16  Yes Sheikh, [place], DO  pravastatin (PRAVACHOL) 40 MG tablet TAKE 1 TABLET EVERY EVENING Patient taking differently: TAKE 40 MG BY MOUTH EVERY EVENING 09/22/16  Yes Leonie Man, MD  senna-docusate (SENOKOT-S) 8.6-50 MG tablet Take 1 tablet by mouth 2 (two) times daily. 10/03/16  Yes Sheikh, Omair Latif, DO  tamsulosin (FLOMAX) 0.4 MG CAPS capsule Take 0.4 mg by mouth daily.  10/01/16  Yes [provider]  warfarin (COUMADIN) 5 MG tablet TAKE 1 TO 1 AND 1/2 TABLETS DAILY AS DIRECTED Patient taking differently: Take 5 mg by mouth daily on Monday, Wednesday and Friday. Take 7.5 mg by mouth daily on all other days 08/29/16  Yes Troy Sine, MD    Family History Family History  Problem Relation Age of Onset  . Cancer Mother   . Heart attack Father   . Heart attack Brother     Social History Social History  Substance Use Topics  . Smoking status: Current Every Day Smoker    Packs/day: 0.50    Years: 55.00    Types: Cigarettes  . Smokeless tobacco: Never Used     Comment: He's been smoking between 0.5 and 2 ppd since age 31.  [name] Alcohol use No     Comment: "quit drinking in 1986"     Allergies   Flexeril [cyclobenzaprine]   Review of Systems Review of Systems  Respiratory: Negative for shortness of breath.   Cardiovascular: Negative for chest pain.  Gastrointestinal: Negative for vomiting.  Neurological: Positive for weakness.  Psychiatric/Behavioral: Negative for confusion.  All other systems reviewed and are negative.    Physical Exam Updated Vital Signs BP (!) 109/58 (BP Location: Right Arm)   Pulse 89   Temp 98.5 F (36.9 C) (Oral)   Resp 20   Wt 76.7 kg (169 lb)   SpO2 99%   BMI 27.28 kg/m   Physical Exam  Constitutional: He is oriented to person, place, and time. He appears well-developed and well-nourished. No distress.  HENT:  Head: Normocephalic and atraumatic.  Mouth/Throat: Oropharynx is clear and moist.  Eyes: Pupils are equal, round, and reactive to light. Conjunctivae and EOM are normal.  Neck: Normal range of motion. Neck supple.  Cardiovascular: Normal rate and intact distal pulses.  An irregularly irregular rhythm present.  No murmur heard. Pulmonary/Chest: Effort normal and breath sounds normal. No respiratory distress. He has no wheezes. He has no rales.  Abdominal: Soft. He exhibits no distension. There is no tenderness.  There is no rebound and no guarding.  Genitourinary:  Genitourinary Comments: Normal brown stool  Musculoskeletal: Normal range of motion. He exhibits no edema or tenderness.  Neurological: He is alert and oriented to person, place, and time. He has normal strength. No cranial nerve deficit or sensory  deficit. Coordination normal.  Skin: Skin is warm and dry. No rash noted. No erythema. There is pallor.  Psychiatric: He has a normal mood and affect. His behavior is normal.  Nursing note and vitals reviewed.    ED Treatments / Results  Labs (all labs ordered are listed, but only abnormal results are displayed) Labs Reviewed  CBC WITH DIFFERENTIAL/PLATELET - Abnormal; Notable for the following:       Result Value   RBC 2.75 (*)    Hemoglobin 6.9 (*)    HCT 23.1 (*)    MCH 25.1 (*)    MCHC 29.9 (*)    RDW 20.2 (*)    All other components within normal limits  COMPREHENSIVE METABOLIC PANEL - Abnormal; Notable for the following:    Glucose, Bld 110 (*)    Calcium 8.8 (*)    Total Protein 5.6 (*)    Albumin 3.0 (*)    Total Bilirubin 0.2 (*)    All other components within normal limits  PROTIME-INR - Abnormal; Notable for the following:    Prothrombin Time 19.7 (*)    All other components within normal limits  URINALYSIS, ROUTINE W REFLEX MICROSCOPIC  OCCULT BLOOD X 1 CARD TO LAB, STOOL  I-STAT TROPONIN, ED  TYPE AND SCREEN    EKG  EKG Interpretation None       Radiology No results found.  Procedures Procedures (including critical care time)  Medications Ordered in ED Medications - No data to display   Initial Impression / Assessment and Plan / ED Course  I have reviewed the triage vital signs and the nursing notes.  Pertinent labs & imaging results that were available during my care of the patient were reviewed by me and considered in my medical decision making (see chart for details).     Patient here for a vague weakness that occurred earlier this  afternoon with multiple medical problems including GI bleed and anemia. Patient is concerned that he is anemic again. He states he will usually feels this way when that happens. He is hemodynamically stable and in no acute distress at this time. He denies any symptoms suggestive of cardiac or respiratory etiology. He has no abdominal pain or concerns fora leaking he AAA, dissection or CHF. Patient displays no strokelike symptoms. Will check CBC, CMP, UA, EKG and troponin to further evaluate.  9:35 PM Hemoglobin today is 6.9. Rectal exam done was normal color and Hemoccult is pending. Will order blood transfusion of systems is most likely the cause of the patient's weakness.  Hemoccult positive and INR stable at 1.68  CRITICAL CARE Performed by: [name] Total critical care time: 30 minutes Critical care time was exclusive of separately billable procedures and treating other patients. Critical care was necessary to treat or prevent imminent or life-threatening deterioration. Critical care was time spent personally by me on the following activities: development of treatment plan with patient and/or surrogate as well as nursing, discussions with consultants, evaluation of patient's response to treatment, examination of patient, obtaining history from patient or surrogate, ordering and performing treatments and interventions, ordering and review of laboratory studies, ordering and review of radiographic studies, pulse oximetry and re-evaluation of patient's condition.   Final Clinical Impressions(s) / ED Diagnoses   Final diagnoses:  Acute blood loss anemia  Gastrointestinal hemorrhage, unspecified gastrointestinal hemorrhage type    New Prescriptions New Prescriptions   No medications on file     [name], MD 11/28/16 2142

## 2016-11-28 NOTE — ED Triage Notes (Signed)
Patient BIB [facility] from home with c/o weakness, starting at 1400 this afternoon. Patient VSS for EMS. Patient reports he has history of anemia and "wants to be checked."

## 2016-11-28 NOTE — ED Notes (Signed)
Patient given sprite.

## 2016-11-28 NOTE — H&P (Addendum)
History and Physical    [name] HCW:[id number] DOB: Aug 08, 1940 DOA: 11/28/2016  PCP: Bernerd Limbo, MD Consultants:  Paulita Fujita - GIMarin Olp - oncology; [name] - cardiology Patient coming from:  Home - lives with daughter and son-in-law; Donald Prose: daughter, [phone]  Chief Complaint: weakness  HPI: [name] is a 76 y.o. male with medical history significant of PAF on Coumadin, Diltiazem, and Flecainide; HTN; HLD; h/o gastric ulcer and multiple transfusions; diastolic heart failure; DM; and COPD with ongoing tobacco use presenting because he noticed weakness a couple of days ago.  It has progressed.  He started feeling cold this AM and decided he should check it out.  No blood in his stools now or ever that he has seen.  He has had this problem multiple times in the past, at least 6.  No SOB.  No abdominal pain.    His last EGD was on 07/11/16 and showed moderately severe reflux esophagitis; a large amount of food (residue) in the stomach; and a patent Billroth II gastrojejunostomy was found, characterized by ulceration.  He was subsequently hospitalized for bleeds from 7/8-10 and 7/29-30.   ED Course: Hgb 6.9, heme positive, INR 1.68 on Coumadin.  Transfusion of 2 units PRBC.  Review of Systems: As per HPI; otherwise review of systems reviewed and negative.   Ambulatory Status:  Ambulates with a cane when he feels weak  Past Medical History:  Diagnosis Date  . Anemia    takes Ferrous Sulfate daily  . Arthritis    "all over"  . Balance problem 01/2014  . CAP (community acquired pneumonia) 09/18/2014  . Cervical radiculopathy due to degenerative joint disease of spine   . COPD (chronic obstructive pulmonary disease) ([place])   . Coronary artery disease, non-occlusive    a. 03/2010 Nonocclusive disease by cath, performed for ST elevations on ECG;  b. 06/2013 Lexi MV: EF 60%, no ischemia.  . Diabetes mellitus type II    takes Metformin and Lantus daily  . Diastolic CHF, chronic  ([place])    a. 12/2012 EF 55-60%, diast dysfxn, triv MR, mildly dil LA/RA.  [name] History of blood transfusion 1982   "when I had stomach OR"  . History of bronchitis    1998  . History of gastric ulcer   . HTN (hypertension)    takes Diltiazem daily  . Hyperlipidemia    takes Pravastatin daily  . Joint pain   . PAF (paroxysmal atrial fibrillation) (HCC)    Recurrent after atrial flutter (a. 07/2010 Status post caval tricuspid isthmus ablation by Dr. Midge Aver Metoprolol daily), currently controlled on flecainide plus diltiazem and Coumadin  . Weakness    numbness and tingling both hands    Past Surgical History:  Procedure Laterality Date  . ATRIAL ABLATION SURGERY  08/05/10   CTI ablation for atrial flutter by JA  . CARDIAC CATHETERIZATION  2012   nl LV function, no occlusive CAD, PAF  . CARDIOVERSION  12/07/2010    Successful direct current cardioversion with atrial fibrillation to normal sinus rhythm  . CARPAL TUNNEL RELEASE Bilateral 01/30/2014   Procedure: BILATERAL CARPAL TUNNEL RELEASE;  Surgeon: Marianna Payment, MD;  Location: [facility];  Service: Orthopedics;  Laterality: Bilateral;  . CATARACT EXTRACTION W/ INTRAOCULAR LENS  IMPLANT, BILATERAL Bilateral   . COLONOSCOPY [name] 12/02/2013   Procedure: COLONOSCOPY;  Surgeon: Irene Shipper, MD;  Location: [facility];  Service: Endoscopy;  Laterality: [name];  . ESOPHAGOGASTRODUODENOSCOPY [name] 09/22/2014   Procedure: ESOPHAGOGASTRODUODENOSCOPY (EGD);  Surgeon:  Ronald Lobo, MD;  Location: [facility] ENDOSCOPY;  Service: Endoscopy;  Laterality: [name];  . ESOPHAGOGASTRODUODENOSCOPY [name] 07/11/2016   Procedure: ESOPHAGOGASTRODUODENOSCOPY (EGD);  Surgeon: Doran Stabler, MD;  Location: [facility] ENDOSCOPY;  Service: Endoscopy;  Laterality: [name];  . INCISION AND DRAINAGE ABSCESS / HEMATOMA OF BURSA / KNEE / THIGH Left 1998   knee  . KNEE BURSECTOMY Left 1998  . LAPAROSCOPIC CHOLECYSTECTOMY  03/2010  . NM MYOVIEW LTD  07/22/2013   Normal EF ~60%, no ischemia  or infarction.  [name] PARTIAL GASTRECTOMY  1982   subtotal; "took out 30% for ulcers"  . TRANSTHORACIC ECHOCARDIOGRAM  02/16/2014   EF 60%, no RWMA. - otherwise normal  . YAG LASER APPLICATION Bilateral     Social History   Social History  . Marital status: Widowed    Spouse name: [name]  . Number of children: [name]  . Years of education: [name]   Occupational History  . Retired    Social History Main Topics  . Smoking status: Current Every Day Smoker    Packs/day: 0.50    Years: 55.00    Types: Cigarettes  . Smokeless tobacco: Never Used     Comment: He's been smoking between 0.5 and 2 ppd since age 65.  [name] Alcohol use No     Comment: "quit drinking in 1986"  . Drug use: No  . Sexual activity: No   Other Topics Concern  . Not on file   Social History Narrative   He is a widower, who recently moved to [place] back in 2011 to live closer to his daughter. He formerly lived in [place]. He is a father 71, grandfather, and great-grandfather of 57. Unfortunately as I mentioned he's gone back to smoking. Smoking about a half pack a day now. He is not very active, but does try to get outside and walk some. He does not drink alcohol.    Allergies  Allergen Reactions  . Flexeril [Cyclobenzaprine] Other (See Comments)    Makes it hard for him to stand    Family History  Problem Relation Age of Onset  . Cancer Mother   . Heart attack Father   . Heart attack Brother     Prior to Admission medications   Medication Sig Start Date End Date Taking? Authorizing Provider  acetaminophen (TYLENOL) 650 MG CR tablet Take 650 mg by mouth 2 (two) times daily as needed for pain.   Yes [provider]  diltiazem (CARDIZEM CD) 120 MG 24 hr capsule Take 1 capsule (120 mg total) by mouth daily. 10/25/16  Yes Strader, Fransisco Hertz, PA-C  flecainide (TAMBOCOR) 150 MG tablet Take 1.5 tablets (225 mg total) by mouth 2 (two) times daily. 10/25/16  Yes Strader, Fransisco Hertz, PA-C    HYDROcodone-acetaminophen (NORCO/VICODIN) 5-325 MG tablet Take 1 tablet by mouth 2 (two) times daily as needed for moderate pain.   Yes [provider]  Insulin Glargine (LANTUS) 100 UNIT/ML Solostar Pen Inject 8 Units into the skin at bedtime. Patient taking differently: Inject 8 Units into the skin 2 (two) times daily.  07/12/16  Yes Holley Raring, MD  loratadine (CLARITIN) 10 MG tablet Take 10 mg by mouth every evening.    Yes [provider]  metFORMIN (GLUCOPHAGE) 1000 MG tablet Take 1,000 mg by mouth 2 (two) times daily with a meal.    Yes [provider]  metoprolol succinate (TOPROL-XL) 25 MG 24 hr tablet TAKE 1/2 TABLET  (12.5 MG TOTAL) BY MOUTH DAILY.  10/25/16  Yes Strader, [place] M, PA-C  Multiple Vitamin (MULTIVITAMIN WITH MINERALS) TABS tablet Take 1 tablet by mouth 2 (two) times daily.    Yes [provider]  nitroGLYCERIN (NITROSTAT) 0.4 MG SL tablet Place 1 tablet (0.4 mg total) under the tongue every 5 (five) minutes x 3 doses as needed for chest pain. 11/19/15  Yes Kilroy, Luke K, PA-C  oxybutynin (DITROPAN-XL) 5 MG 24 hr tablet Take 5 mg by mouth daily. 11/02/16 11/02/17 Yes [provider]  pantoprazole (PROTONIX) 40 MG tablet Take 40 mg by mouth daily.  10/06/16  Yes [provider]  polyethylene glycol (MIRALAX / GLYCOLAX) packet Take 17 g by mouth 2 (two) times daily. 10/03/16  Yes Sheikh, [place], DO  pravastatin (PRAVACHOL) 40 MG tablet TAKE 1 TABLET EVERY EVENING Patient taking differently: TAKE 40 MG BY MOUTH EVERY EVENING 09/22/16  Yes Leonie Man, MD  senna-docusate (SENOKOT-S) 8.6-50 MG tablet Take 1 tablet by mouth 2 (two) times daily. 10/03/16  Yes Sheikh, Omair Latif, DO  tamsulosin (FLOMAX) 0.4 MG CAPS capsule Take 0.4 mg by mouth daily.  10/01/16  Yes [provider]  warfarin (COUMADIN) 5 MG tablet TAKE 1 TO 1 AND 1/2 TABLETS DAILY AS DIRECTED Patient taking differently: Take 5 mg by mouth daily on  Monday, Wednesday and Friday. Take 7.5 mg by mouth daily on all other days 08/29/16  Yes Troy Sine, MD    Physical Exam: Vitals:   11/28/16 1956 11/28/16 2001 11/28/16 2255  BP:  (!) 109/58 (!) 117/47  Pulse:  89 75  Resp:  20 20  Temp:  98.5 F (36.9 C)   TempSrc:  Oral   SpO2:  99% 93%  Weight: 76.7 kg (169 lb)       General: Appears calm and comfortable and is NAD Eyes:  PERRL, EOMI, normal lids, iris ENT:  grossly normal hearing, lips & tongue, mildly dry mm Neck:  no LAD, masses or thyromegaly; no carotid bruits Cardiovascular:  RRR, no m/r/g. No LE edema.  Respiratory:   CTA bilaterally with no rales/rhonchi but scattered wheezes are noted.  Normal respiratory effort. Abdomen: soft, NT, ND, NABS Back:   normal alignment, no CVAT Skin:  no rash or induration seen on limited exam Musculoskeletal:  grossly normal tone BUE/BLE, good ROM, no bony abnormality Psychiatric:  grossly normal mood and affect, speech fluent and appropriate, AOx3 Neurologic:  CN 2-12 grossly intact, moves all extremities in coordinated fashion, sensation intact    Radiological Exams on Admission: Dg Chest 2 View  Result Date: 11/28/2016 CLINICAL DATA:  Shortness of breath and cough EXAM: CHEST  2 VIEW COMPARISON:  October 22, 2016 FINDINGS: There is no edema or consolidation. The heart size and pulmonary vascularity are normal. No adenopathy. No bone lesions. There are surgical clips at the gastroesophageal junction. IMPRESSION: No edema or consolidation. Electronically Signed   By: Lowella Grip III M.D.   On: 11/28/2016 21:15    EKG: Independently reviewed.  NSR with rate 71; nonspecific ST changes with no evidence of acute ischemia   Labs on Admission: I have personally reviewed the available labs and imaging studies at the time of the admission.  Pertinent labs:   UA: small LE, rare bacteria Heme positive Glucose 110 Albumin 3.0 Hgb 6.9, prior 7.7 on 7/30, baseline  8-9   Assessment/Plan Principal Problem:   Symptomatic anemia Active Problems:   Essential hypertension   Chronic diastolic congestive heart failure ([place])  Type 2 diabetes mellitus (HCC)   Anticoagulation goal of INR 2 to 3, for PAF - CHA2DS2Vasc = 7   Upper GI bleeding   Tobacco use disorder   Symptomatic anemia from an upper GI bleed -Patient with multiple prior admissions for GI bleeding including 4/18 and 7/18 x2 -Hgb is 6.9, baseline 8-9; it is normocytic likely from acute blood loss. -Heme testing was positive in the ER. -Will observe in a med surg bed. -Transfuse 2 units PRBC and recheck Hgb afterwards.   -Patient counseled about short- and long-term risks associated with transfusion and consents to receive blood products. -Patient's lightheadedness and fatigue are most likely caused by anemia secondary to upper GI bleeding.  - NPO for possible EGD tomorrow - he did not have an evaluation during his most recent admission and was supposed to have this done as an outpatient but this has not been coordinated to date. - NS at 75 mL/hr while NPO. - Start IV pantoprazole 40 mg bid - Zofran IV for nausea - Avoid NSAIDs and SQ heparin - Maintain IV access (2 large bore IVs if possible). - Hold Coumadin and consider discontinuation (see below).  Afib on Coumadin -Patient with long-standing afib -Most recent estimated CHA2DS2-VASc score was 7, appears to currently be 5 with an estimated adjusted stroke rate of 6.7%/year -With now 4 bleeds since 4/18, his bleeding risk appears to be higher than his stroke risk -Suggest ongoing consideration of this issue -Hold Coumadin for now and allow INR to drift back down -Continue Cardizem, Flecainide, and Toprol  HTN -On Cardizem and Toprol with low normal BP while in the ER -Continue medications tomorrow when due  Diastolic CHF -Questionable on 10/14 Echo but not reported since on subsequent echoes in 11/15 or 12/16 -Will be judicious  but not overly cautious with hydration  DM -A1c 6 in 2/17, will repeat -Hold Glucophage -Cover with moderate-scale SSI  Tobacco dependence -Encourage cessation.  This was discussed with the patient and should be reviewed on an ongoing basis.  -Patch ordered at patient request. -Duonebs and prn albuterol ordered.   DVT prophylaxis:  SCDs Code Status: Full - confirmed with patient Family Communication: None present  Disposition Plan:  Home once clinically improved Consults called: Gi in AM  Admission status: It is my clinical opinion that referral for OBSERVATION is reasonable and necessary in this patient based on the above information provided. The aforementioned taken together are felt to place the patient at high risk for further clinical deterioration. However it is anticipated that the patient may be medically stable for discharge from the hospital within 24 to 48 hours.    Karmen Bongo MD [facility]  If note is complete, please contact covering daytime or nighttime physician. [email or web address] Password [email or web address]  11/28/2016, 11:02 PM

## 2016-11-28 NOTE — Progress Notes (Signed)
Please call Hilda at [phone] for report. Phone # [phone]

## 2016-11-28 NOTE — ED Notes (Signed)
Gave report to [place], RN for room [phone].

## 2016-11-28 NOTE — ED Notes (Signed)
Bed: [facility] Expected date:  Expected time:  Means of arrival:  Comments: 

## 2016-11-29 DIAGNOSIS — Z9049 Acquired absence of other specified parts of digestive tract: Secondary | ICD-10-CM | POA: Diagnosis not present

## 2016-11-29 DIAGNOSIS — Z7901 Long term (current) use of anticoagulants: Secondary | ICD-10-CM | POA: Diagnosis not present

## 2016-11-29 DIAGNOSIS — D500 Iron deficiency anemia secondary to blood loss (chronic): Secondary | ICD-10-CM | POA: Diagnosis not present

## 2016-11-29 DIAGNOSIS — F17210 Nicotine dependence, cigarettes, uncomplicated: Secondary | ICD-10-CM | POA: Diagnosis present

## 2016-11-29 DIAGNOSIS — Z794 Long term (current) use of insulin: Secondary | ICD-10-CM | POA: Diagnosis not present

## 2016-11-29 DIAGNOSIS — E119 Type 2 diabetes mellitus without complications: Secondary | ICD-10-CM | POA: Diagnosis present

## 2016-11-29 DIAGNOSIS — I10 Essential (primary) hypertension: Secondary | ICD-10-CM | POA: Diagnosis not present

## 2016-11-29 DIAGNOSIS — E669 Obesity, unspecified: Secondary | ICD-10-CM | POA: Diagnosis present

## 2016-11-29 DIAGNOSIS — D62 Acute posthemorrhagic anemia: Secondary | ICD-10-CM | POA: Diagnosis not present

## 2016-11-29 DIAGNOSIS — I5032 Chronic diastolic (congestive) heart failure: Secondary | ICD-10-CM | POA: Diagnosis not present

## 2016-11-29 DIAGNOSIS — J449 Chronic obstructive pulmonary disease, unspecified: Secondary | ICD-10-CM | POA: Diagnosis present

## 2016-11-29 DIAGNOSIS — K284 Chronic or unspecified gastrojejunal ulcer with hemorrhage: Secondary | ICD-10-CM | POA: Diagnosis present

## 2016-11-29 DIAGNOSIS — Z9842 Cataract extraction status, left eye: Secondary | ICD-10-CM | POA: Diagnosis not present

## 2016-11-29 DIAGNOSIS — I480 Paroxysmal atrial fibrillation: Secondary | ICD-10-CM | POA: Diagnosis present

## 2016-11-29 DIAGNOSIS — I110 Hypertensive heart disease with heart failure: Secondary | ICD-10-CM | POA: Diagnosis present

## 2016-11-29 DIAGNOSIS — Z961 Presence of intraocular lens: Secondary | ICD-10-CM | POA: Diagnosis present

## 2016-11-29 DIAGNOSIS — Z87891 Personal history of nicotine dependence: Secondary | ICD-10-CM | POA: Diagnosis not present

## 2016-11-29 DIAGNOSIS — Z809 Family history of malignant neoplasm, unspecified: Secondary | ICD-10-CM | POA: Diagnosis not present

## 2016-11-29 DIAGNOSIS — Z888 Allergy status to other drugs, medicaments and biological substances status: Secondary | ICD-10-CM | POA: Diagnosis not present

## 2016-11-29 DIAGNOSIS — Z903 Acquired absence of stomach [part of]: Secondary | ICD-10-CM | POA: Diagnosis not present

## 2016-11-29 DIAGNOSIS — R531 Weakness: Secondary | ICD-10-CM | POA: Diagnosis not present

## 2016-11-29 DIAGNOSIS — I4892 Unspecified atrial flutter: Secondary | ICD-10-CM | POA: Diagnosis present

## 2016-11-29 DIAGNOSIS — I2510 Atherosclerotic heart disease of native coronary artery without angina pectoris: Secondary | ICD-10-CM | POA: Diagnosis present

## 2016-11-29 DIAGNOSIS — I4891 Unspecified atrial fibrillation: Secondary | ICD-10-CM | POA: Diagnosis not present

## 2016-11-29 DIAGNOSIS — Z9841 Cataract extraction status, right eye: Secondary | ICD-10-CM | POA: Diagnosis not present

## 2016-11-29 DIAGNOSIS — Z8249 Family history of ischemic heart disease and other diseases of the circulatory system: Secondary | ICD-10-CM | POA: Diagnosis not present

## 2016-11-29 DIAGNOSIS — D649 Anemia, unspecified: Secondary | ICD-10-CM | POA: Diagnosis not present

## 2016-11-29 DIAGNOSIS — Z6827 Body mass index (BMI) 27.0-27.9, adult: Secondary | ICD-10-CM | POA: Diagnosis not present

## 2016-11-29 DIAGNOSIS — E785 Hyperlipidemia, unspecified: Secondary | ICD-10-CM | POA: Diagnosis present

## 2016-11-29 DIAGNOSIS — Z8673 Personal history of transient ischemic attack (TIA), and cerebral infarction without residual deficits: Secondary | ICD-10-CM | POA: Diagnosis not present

## 2016-11-29 LAB — CBC
HEMATOCRIT: 28.5 % — AB (ref 39.0–52.0)
HEMOGLOBIN: 8.7 g/dL — AB (ref 13.0–17.0)
MCH: 25.1 pg — ABNORMAL LOW (ref 26.0–34.0)
MCHC: 30.5 g/dL (ref 30.0–36.0)
MCV: 82.1 fL (ref 78.0–100.0)
Platelets: 245 10*3/uL (ref 150–400)
RBC: 3.47 MIL/uL — ABNORMAL LOW (ref 4.22–5.81)
RDW: 18.8 % — ABNORMAL HIGH (ref 11.5–15.5)
WBC: 4.6 10*3/uL (ref 4.0–10.5)

## 2016-11-29 LAB — BASIC METABOLIC PANEL
Anion gap: 6 (ref 5–15)
BUN: 15 mg/dL (ref 6–20)
CO2: 26 mmol/L (ref 22–32)
Calcium: 8.5 mg/dL — ABNORMAL LOW (ref 8.9–10.3)
Chloride: 106 mmol/L (ref 101–111)
Creatinine, Ser: 0.83 mg/dL (ref 0.61–1.24)
GFR calc Af Amer: >60 mL/min (ref >60)
GFR calc non Af Amer: >60 mL/min (ref >60)
GLUCOSE: 101 mg/dL — AB (ref 65–99)
Potassium: 3.9 mmol/L (ref 3.5–5.1)
Sodium: 138 mmol/L (ref 135–145)

## 2016-11-29 LAB — GLUCOSE, CAPILLARY
GLUCOSE-CAPILLARY: 100 mg/dL — AB (ref 65–99)
GLUCOSE-CAPILLARY: 104 mg/dL — AB (ref 65–99)
GLUCOSE-CAPILLARY: 123 mg/dL — AB (ref 65–99)
GLUCOSE-CAPILLARY: 78 mg/dL (ref 65–99)
GLUCOSE-CAPILLARY: 86 mg/dL (ref 65–99)

## 2016-11-29 LAB — HEMOGLOBIN A1C
Hgb A1c MFr Bld: 4.4 % — ABNORMAL LOW (ref 4.8–5.6)
Mean Plasma Glucose: 79.58 mg/dL

## 2016-11-29 MED ORDER — IPRATROPIUM-ALBUTEROL 0.5-2.5 (3) MG/3ML IN SOLN: 3.0000 mL | Freq: Four times a day (QID) | RESPIRATORY_TRACT | Status: DC | PRN

## 2016-11-29 NOTE — Progress Notes (Signed)
Resumed care of patient. Agree with previous RN's assessments. Will continue to follow plan of care for patient.

## 2016-11-29 NOTE — Care Management Note (Signed)
Case Management Note  Patient Details  Name: Sandip Power MRN: [id number] Date of Birth: 1941-01-19  Subjective/Objective:  76 y/o m admitted w/Symptomatic anemia. From home.                   Action/Plan:d/c home.   Expected Discharge Date:                  Expected Discharge Plan:  Home/Self Care  In-House Referral:     Discharge planning Services  CM Consult  Post Acute Care Choice:    Choice offered to:     DME Arranged:    DME Agency:     HH Arranged:    HH Agency:     Status of Service:  In process, will continue to follow  If discussed at Long Length of Stay Meetings, dates discussed:    Additional Comments:  Dessa Phi, RN 11/29/2016, 12:49 PM

## 2016-11-29 NOTE — Consult Note (Signed)
[facility] Consult  Referring Provider: Domenic Polite, MD Primary Care Physician:  Bernerd Limbo, MD Primary Gastroenterologist: Dr.Outlaw  Reason for Consultation:  Anemia, prior anastomotic ulcer  HPI: [name] is a 76 y.o. Caucasian male admitted on 11/28/16 with complaints of weakness. Patient states he was in his usual state of health until 3 weeks ago, he felt well after receiving blood transfusions. However, he became progressively weak. Since this has happened multiple times in the past and he has required blood transfusions which caused improvement in his symptoms, he decided to come to the ER.  Hemoglobin on 10/23/16 was 9.6 and hemoglobin on admission yesterday was explained to 6.9. Patient takes warfarin for atrial flutter and atrial fibrillation. Patient states he has had ulcers since childhood(from his esophagus to stomach and small bowel), eventually needing a gastrojejunostomy in 1982.  EGD from 07/11/16 showed esophagitis and ulcerated friable gastrojejunostomy, Billroth II surgery with normal afferent and efferent loops. He was seen by Dr. Paulita Fujita in the office on 11/23/16, and was recommended not to have further endoscopy, as prior CAT scan has not shown any obvious mass, and bleeding is from oozing and ulceration of the gastrojejunostomy site. He was advised to continue Protonix and IV iron along with by mouth iron.  Patient denies black stool or bloody bowel movements. He is on narcotic for neck and back pain and reports constipation and hard stools. Colonoscopy from 2015 showed a tubular adenoma in the rectum, diverticulosis and internal hemorrhoids.   Past Medical History:  Diagnosis Date  . Anemia    takes Ferrous Sulfate daily  . Arthritis    "all over"  . Balance problem 01/2014  . CAP (community acquired pneumonia) 09/18/2014  . Cervical radiculopathy due to degenerative joint disease of spine   . COPD (chronic obstructive pulmonary disease)  ([place])   . Coronary artery disease, non-occlusive    a. 03/2010 Nonocclusive disease by cath, performed for ST elevations on ECG;  b. 06/2013 Lexi MV: EF 60%, no ischemia.  . Diabetes mellitus type II    takes Metformin and Lantus daily  . Diastolic CHF, chronic ([place])    a. 12/2012 EF 55-60%, diast dysfxn, triv MR, mildly dil LA/RA.  [name] History of blood transfusion 1982   "when I had stomach OR"  . History of bronchitis    1998  . History of gastric ulcer   . HTN (hypertension)    takes Diltiazem daily  . Hyperlipidemia    takes Pravastatin daily  . Joint pain   . PAF (paroxysmal atrial fibrillation) (H[facility])    Recurrent after atrial flutter (a. 07/2010 Status post caval tricuspid isthmus ablation by Dr. Midge Aver Metoprolol daily), currently controlled on flecainide plus diltiazem and Coumadin  . Weakness    numbness and tingling both hands    Past Surgical History:  Procedure Laterality Date  . ATRIAL ABLATION SURGERY  08/05/10   CTI ablation for atrial flutter by JA  . CARDIAC CATHETERIZATION  2012   nl LV function, no occlusive CAD, PAF  . CARDIOVERSION  12/07/2010    Successful direct current cardioversion with atrial fibrillation to normal sinus rhythm  . CARPAL TUNNEL RELEASE Bilateral 01/30/2014   Procedure: BILATERAL CARPAL TUNNEL RELEASE;  Surgeon: Marianna Payment, MD;  Location: [facility];  Service: Orthopedics;  Laterality: Bilateral;  . CATARACT EXTRACTION W/ INTRAOCULAR LENS  IMPLANT, BILATERAL Bilateral   . COLONOSCOPY [name] 12/02/2013   Procedure: COLONOSCOPY;  Surgeon: Irene Shipper, MD;  Location: [facility];  Service: Endoscopy;  Laterality: [name];  . ESOPHAGOGASTRODUODENOSCOPY [name] 09/22/2014   Procedure: ESOPHAGOGASTRODUODENOSCOPY (EGD);  Surgeon: Ronald Lobo, MD;  Location: [facility] ENDOSCOPY;  Service: Endoscopy;  Laterality: [name];  . ESOPHAGOGASTRODUODENOSCOPY [name] 07/11/2016   Procedure: ESOPHAGOGASTRODUODENOSCOPY (EGD);  Surgeon: Doran Stabler, MD;  Location: [facility]  ENDOSCOPY;  Service: Endoscopy;  Laterality: [name];  . INCISION AND DRAINAGE ABSCESS / HEMATOMA OF BURSA / KNEE / THIGH Left 1998   knee  . KNEE BURSECTOMY Left 1998  . LAPAROSCOPIC CHOLECYSTECTOMY  03/2010  . NM MYOVIEW LTD  07/22/2013   Normal EF ~60%, no ischemia or infarction.  [name] PARTIAL GASTRECTOMY  1982   subtotal; "took out 30% for ulcers"  . TRANSTHORACIC ECHOCARDIOGRAM  02/16/2014   EF 60%, no RWMA. - otherwise normal  . YAG LASER APPLICATION Bilateral     Prior to Admission medications   Medication Sig Start Date End Date Taking? Authorizing Provider  acetaminophen (TYLENOL) 650 MG CR tablet Take 650 mg by mouth 2 (two) times daily as needed for pain.   Yes [provider]  diltiazem (CARDIZEM CD) 120 MG 24 hr capsule Take 1 capsule (120 mg total) by mouth daily. 10/25/16  Yes Strader, Fransisco Hertz, PA-C  flecainide (TAMBOCOR) 150 MG tablet Take 1.5 tablets (225 mg total) by mouth 2 (two) times daily. 10/25/16  Yes Strader, Fransisco Hertz, PA-C  HYDROcodone-acetaminophen (NORCO/VICODIN) 5-325 MG tablet Take 1 tablet by mouth 2 (two) times daily as needed for moderate pain.   Yes [provider]  Insulin Glargine (LANTUS) 100 UNIT/ML Solostar Pen Inject 8 Units into the skin at bedtime. Patient taking differently: Inject 8 Units into the skin 2 (two) times daily.  07/12/16  Yes Holley Raring, MD  loratadine (CLARITIN) 10 MG tablet Take 10 mg by mouth every evening.    Yes [provider]  metFORMIN (GLUCOPHAGE) 1000 MG tablet Take 1,000 mg by mouth 2 (two) times daily with a meal.    Yes [provider]  metoprolol succinate (TOPROL-XL) 25 MG 24 hr tablet TAKE 1/2 TABLET  (12.5 MG TOTAL) BY MOUTH DAILY. 10/25/16  Yes Strader, [place] M, PA-C  Multiple Vitamin (MULTIVITAMIN WITH MINERALS) TABS tablet Take 1 tablet by mouth 2 (two) times daily.    Yes [provider]  nitroGLYCERIN (NITROSTAT) 0.4 MG SL tablet Place 1 tablet (0.4 mg total) under the  tongue every 5 (five) minutes x 3 doses as needed for chest pain. 11/19/15  Yes Kilroy, Luke K, PA-C  oxybutynin (DITROPAN-XL) 5 MG 24 hr tablet Take 5 mg by mouth daily. 11/02/16 11/02/17 Yes [provider]  pantoprazole (PROTONIX) 40 MG tablet Take 40 mg by mouth daily.  10/06/16  Yes [provider]  polyethylene glycol (MIRALAX / GLYCOLAX) packet Take 17 g by mouth 2 (two) times daily. 10/03/16  Yes Sheikh, [place], DO  pravastatin (PRAVACHOL) 40 MG tablet TAKE 1 TABLET EVERY EVENING Patient taking differently: TAKE 40 MG BY MOUTH EVERY EVENING 09/22/16  Yes Leonie Man, MD  senna-docusate (SENOKOT-S) 8.6-50 MG tablet Take 1 tablet by mouth 2 (two) times daily. 10/03/16  Yes Sheikh, Omair Latif, DO  tamsulosin (FLOMAX) 0.4 MG CAPS capsule Take 0.4 mg by mouth daily.  10/01/16  Yes [provider]  warfarin (COUMADIN) 5 MG tablet TAKE 1 TO 1 AND 1/2 TABLETS DAILY AS DIRECTED Patient taking differently: Take 5 mg by mouth daily on Monday, Wednesday and Friday. Take 7.5 mg by mouth daily on all other days  08/29/16  Yes Troy Sine, MD    Current Facility-Administered Medications  Medication Dose Route Frequency Provider Last Rate Last Dose  . acetaminophen (TYLENOL) tablet 650 mg  650 mg Oral Q6H PRN Karmen Bongo, MD       Or  . acetaminophen (TYLENOL) suppository 650 mg  650 mg Rectal Q6H PRN Karmen Bongo, MD      . albuterol (PROVENTIL) (2.5 MG/3ML) 0.083% nebulizer solution 2.5 mg  2.5 mg Nebulization Q2H PRN Karmen Bongo, MD      . diltiazem (CARDIZEM CD) 24 hr capsule 120 mg  120 mg Oral Daily Karmen Bongo, MD   120 mg at 11/29/16 8299  . flecainide (TAMBOCOR) tablet 225 mg  225 mg Oral BID Karmen Bongo, MD      . HYDROcodone-acetaminophen (NORCO/VICODIN) 5-325 MG per tablet 1 tablet  1 tablet Oral BID PRN Karmen Bongo, MD      . insulin aspart (novoLOG) injection 0-15 Units  0-15 Units Subcutaneous TID WC Karmen Bongo, MD      . insulin  glargine (LANTUS) injection 8 Units  8 Units Subcutaneous BID Karmen Bongo, MD   8 Units at 11/29/16 [phone]  . ipratropium-albuterol (DUONEB) 0.5-2.5 (3) MG/3ML nebulizer solution 3 mL  3 mL Nebulization Q6H PRN Domenic Polite, MD      . loratadine (CLARITIN) tablet 10 mg  10 mg Oral QPM Karmen Bongo, MD      . metoprolol succinate (TOPROL-XL) 24 hr tablet 12.5 mg  12.5 mg Oral Daily Karmen Bongo, MD      . nicotine (NICODERM CQ - dosed in mg/24 hours) patch 14 mg  14 mg Transdermal Daily Karmen Bongo, MD   14 mg at 11/29/16 9678  . ondansetron (ZOFRAN) tablet 4 mg  4 mg Oral Q6H PRN Karmen Bongo, MD       Or  . ondansetron [facility]) injection 4 mg  4 mg Intravenous Q6H PRN Karmen Bongo, MD      . oxybutynin (DITROPAN-XL) 24 hr tablet 5 mg  5 mg Oral Daily Karmen Bongo, MD   5 mg at 11/29/16 9381  . pantoprazole (PROTONIX) injection 40 mg  40 mg Intravenous Lillia Mountain, MD   40 mg at 11/29/16 0175  . pravastatin (PRAVACHOL) tablet 40 mg  40 mg Oral QPM Karmen Bongo, MD      . tamsulosin [facility]) capsule 0.4 mg  0.4 mg Oral Daily Karmen Bongo, MD   0.4 mg at 11/29/16 1025    Allergies as of 11/28/2016 - Review Complete 11/28/2016  Allergen Reaction Noted  . Flexeril [cyclobenzaprine] Other (See Comments) 10/22/2016    Family History  Problem Relation Age of Onset  . Cancer Mother   . Heart attack Father   . Heart attack Brother     Social History   Social History  . Marital status: Widowed    Spouse name: [name]  . Number of children: [name]  . Years of education: [name]   Occupational History  . Retired    Social History Main Topics  . Smoking status: Current Every Day Smoker    Packs/day: 0.50    Years: 55.00    Types: Cigarettes  . Smokeless tobacco: Never Used     Comment: He's been smoking between 0.5 and 2 ppd since age 73.  [name] Alcohol use No     Comment: "quit drinking in 1986"  . Drug use: No  . Sexual activity: No   Other Topics Concern   . Not  on file   Social History Narrative   He is a widower, who recently moved to [place] back in 2011 to live closer to his daughter. He formerly lived in [place]. He is a father 34, grandfather, and great-grandfather of 59. Unfortunately as I mentioned he's gone back to smoking. Smoking about a half pack a day now. He is not very active, but does try to get outside and walk some. He does not drink alcohol.    Review of Systems: Positive for: GI: Described in detail in HPI.    JOA:CZYSAYT, weakness, malaise, involuntary weight loss,  Denies any fever, chills, rigors, night sweats, anorexia, and sleep disorder CV: Denies chest pain, angina, palpitations, syncope, orthopnea, PND, peripheral edema, and claudication. Resp: Denies dyspnea, cough, sputum, wheezing, coughing up blood. GU : Denies urinary burning, blood in urine, urinary frequency, urinary hesitancy, nocturnal urination, and urinary incontinence. MS: Denies joint pain or swelling.  Denies muscle weakness, cramps, atrophy.  Derm: Denies rash, itching, oral ulcerations, hives, unhealing ulcers.  Psych: Denies depression, anxiety, memory loss, suicidal ideation, hallucinations,  and confusion. Heme: Denies bruising, bleeding, and enlarged lymph nodes. Neuro:  dizziness,Denies any headaches, paresthesias. Endo:  Denies any problems with DM, thyroid, adrenal function.  Physical Exam: Vital signs in last 24 hours: Temp:  [97.6 F (36.4 C)-98.5 F (36.9 C)] 97.8 F (36.6 C) (09/05 1500) Pulse Rate:  [47-89] 52 (09/05 1647) Resp:  [14-20] 18 (09/05 1500) BP: (85-131)/(43-70) 106/51 (09/05 1647) SpO2:  [93 %-100 %] 100 % (09/05 1500) Weight:  [76.5 kg (168 lb 10.4 oz)-76.7 kg (169 lb)] 76.5 kg (168 lb 10.4 oz) (09/04 2349) Last BM Date: 11/28/16  General:   Alert,  Well-developed, well-nourished, pleasant and cooperative in NAD Head:  Normocephalic and atraumatic. Eyes:  Sclera clear, no icterus.   Mild pallor Ears:   Normal auditory acuity. Nose:  No deformity, discharge,  or lesions. Mouth:  No deformity or lesions.  Oropharynx pink & moist. Neck:  Supple; no masses or thyromegaly. Lungs:  Clear throughout to auscultation.   No wheezes, crackles, or rhonchi. No acute distress. Heart:  Regular rate and rhythm; no murmurs, clicks, rubs,  or gallops. Extremities:  Without clubbing or edema. Neurologic:  Alert and  oriented x4;  grossly normal neurologically. Skin:  Intact without significant lesions or rashes. Psych:  Alert and cooperative. Normal mood and affect. Abdomen:  Surgical scar ,Soft, nontender and nondistended. No masses, hepatosplenomegaly or hernias noted. Normal bowel sounds, without guarding, and without rebound.         Lab Results:  Recent Labs  11/28/16 2053 11/29/16 0925  WBC 4.7 4.6  HGB 6.9* 8.7*  HCT 23.1* 28.5*  PLT 259 245   BMET  Recent Labs  11/28/16 2053 11/29/16 0925  NA 139 138  K 4.2 3.9  CL 103 106  CO2 27 26  GLUCOSE 110* 101*  BUN 17 15  CREATININE 0.91 0.83  CALCIUM 8.8* 8.5*   LFT  Recent Labs  11/28/16 2053  PROT 5.6*  ALBUMIN 3.0*  AST 24  ALT 31  ALKPHOS 65  BILITOT 0.2*   PT/INR  Recent Labs  11/28/16 2053  LABPROT 19.7*  INR 1.68    Studies/Results: Dg Chest 2 View  Result Date: 11/28/2016 CLINICAL DATA:  Shortness of breath and cough EXAM: CHEST  2 VIEW COMPARISON:  October 22, 2016 FINDINGS: There is no edema or consolidation. The heart size and pulmonary vascularity are normal. No adenopathy. No bone lesions.  There are surgical clips at the gastroesophageal junction. IMPRESSION: No edema or consolidation. Electronically Signed   By: Lowella Grip III M.D.   On: 11/28/2016 21:15    Impression: 1. Anemia, status post transfusion 2. Normal BUN/creatinine ratio 3. Low albumin of 3, low total protein of 5.6 4. Severe iron deficiency as reflected from labs on 10/20/2016, iron saturation 3%, ferritin 6, elevated TIBC of 401,  iron 14 5. Status post Billroth II surgery with anastomotic ulcers on prior endoscopies  Plan: No frank bleeding such as melena or hematochezia noted, however, patient has chronic iron deficiency anemia. This is likely related to oozing from the anastomotic site with underlying friability and ulceration as noted on prior endoscopy. ? May benefit from serum gastrin as an outpatient(levels may be falsely elevated with continuous use of PPI), however, prior CAT scan does not show any pancreatic mass suspicious for gastrinoma. Mild to moderate pancreatic atrophy noted on prior CAT scan.  Would recommend revisiting the indication for anticoagulation and avoiding warfarin or antiplatelets or NSAIDs as much as possible. This needs to be decided between patient's primary care physician, cardiologist and admitting team.  Patient  needs to continue his PPI therapy, once to twice a day, along with iron supplementation po and IV. Patient may need fiber supplementation or bulk forming laxatives as he is on narcotics and will be on by mouth iron and has constipation.  Patient has had endoscopies(recently on 07/11/16, and also on 09/22/14) with similar findings of anastomotic ulcers and friability around the anastomotic site. I doubt if further endoscopy would provide any diagnostic or therapeutic benefit as patient does not have ongoing melena or hematochezia or hematemesis.  Recommend H&H monitoring and transfusing if hemoglobin is less than 7.     LOS: 0 days   Ronnette Juniper, M.D.  11/29/2016, 5:24 PM  Pager [phone] If no answer or after 5 PM call [phone]

## 2016-11-29 NOTE — Care Management Obs Status (Signed)
[facility] NOTIFICATION   Patient Details  Name: [name] MRN: [id number] Date of Birth: 10/07/1940   Medicare Observation Status Notification Given:  Yes    MahabirJuliann Pulse, RN 11/29/2016, 12:48 PM

## 2016-11-29 NOTE — Progress Notes (Signed)
PROGRESS NOTE    [name]  KKX:[id number] DOB: 1940-11-04 DOA: 11/28/2016 PCP: Bernerd Limbo, MD  Brief Narrative:[name] is a 76 y.o. male with medical history significant of PAF on Coumadin, Diltiazem, and Flecainide; HTN; HLD; s/p billroth 2 and anastomotic ulcer, requiring multiple transfusions; diastolic heart failure; DM; and COPD with ongoing tobacco use presenting because he noticed weakness a couple of days ago. His last EGD was on 07/11/16 and showed moderately severe reflux esophagitis; a large amount of food (residue) in the stomach; and a patent Billroth II gastrojejunostomy was found, characterized by ulceration.  He was subsequently hospitalized for bleeds from 7/8-10 and 7/29-30. In ED,  Hgb 6.9, heme positive, INR 1.68 on Coumadin.  s/p Transfusion of 2 units PRBC.   Assessment & Plan:   Symptomatic anemia from Recurrent Upper GI bleed -Patient with multiple prior admissions for GI bleeding including 4/18 and 7/8 and 7/29 -last EGD with GJ anastomotic ulcer -complaint with PPI, continue IV now -s/p 2units PRBC -[facility] consulted -discussed with patient abt stopping coumadin given 4th admission for same since April 2018, I called and discussed with Dr.Harding, and refer to Afib clinic to eval for Watchman  Afib on Coumadin -Patient with long-standing afib -CHA2DS2-VASc score is  5 with an estimated adjusted stroke rate of 6.7%/year -With now 4 bleeds since 4/18, his bleeding risk is quite high and hence I discussed stopping coumadin with patient and its risks and benefits, and patient agrees, I discussed this with his primary cardiologist dr.Harding as well who is also in agreement, he will be referred to Afib CLinic to eval for Watchman's device -stopped Coumadin and allow INR to drift back down -Continue Cardizem, Flecainide, and Toprol  HTN -On Cardizem and Toprol with low normal BP while in the ER  Chronic Diastolic CHF -stable, compensated, not on  diuretics at home  DM -A1c 6 in 2/17, will repeat -Hold Glucophage -Cover with moderate-scale SSI  Tobacco dependence -Encouraged cessation  DVT prophylaxis:  SCDs Code Status: Full - confirmed with patient Family Communication: None present  Disposition Plan:  Home tomorrow if stable   Consultants:   [facility]  D/w Cards Dr.Harding   Subjective: Feels better after blood transfusion, hasnt had ongoing bleeding that he notices   Objective: Vitals:   11/29/16 0333 11/29/16 0411 11/29/16 0730 11/29/16 0753  BP: (!) 106/52 (!) 101/50 115/70   Pulse: 61 (!) 56 60   Resp: 16 16 18    Temp: 97.6 F (36.4 C) 97.8 F (36.6 C) 97.7 F (36.5 C)   TempSrc: Oral Oral Oral   SpO2: 100% 99% 100% 98%  Weight:      Height:        Intake/Output Summary (Last 24 hours) at 11/29/16 1135 Last data filed at 11/29/16 0833  Gross per 24 hour  Intake              649 ml  Output             1400 ml  Net             -751 ml   Filed Weights   11/28/16 1956 11/28/16 2349  Weight: 76.7 kg (169 lb) 76.5 kg (168 lb 10.4 oz)    Examination:  General exam: Appears calm and comfortable  Respiratory system: Clear to auscultation. Respiratory effort normal. Cardiovascular system: S1 & S2 heard, RRR Gastrointestinal system: Abdomen is nondistended, soft and Normal bowel sounds heard. Central nervous system: Alert and oriented.  No focal neurological deficits. Extremities: Symmetric 5 x 5 power. Skin: No rashes, lesions or ulcers Psychiatry: Judgement and insight appear normal. Mood & affect appropriate.     Data Reviewed:   CBC:  Recent Labs Lab 11/28/16 2053 11/29/16 0925  WBC 4.7 4.6  NEUTROABS 3.2  --   HGB 6.9* 8.7*  HCT 23.1* 28.5*  MCV 84.0 82.1  PLT 259 299   Basic Metabolic Panel:  Recent Labs Lab 11/28/16 2053 11/29/16 0925  NA 139 138  K 4.2 3.9  CL 103 106  CO2 27 26  GLUCOSE 110* 101*  BUN 17 15  CREATININE 0.91 0.83  CALCIUM 8.8* 8.5*    GFR: Estimated Creatinine Clearance: 67.3 mL/min (by C-G formula based on SCr of 0.83 mg/dL). Liver Function Tests:  Recent Labs Lab 11/28/16 2053  AST 24  ALT 31  ALKPHOS 65  BILITOT 0.2*  PROT 5.6*  ALBUMIN 3.0*   No results for input(s): LIPASE, AMYLASE in the last 168 hours. No results for input(s): AMMONIA in the last 168 hours. Coagulation Profile:  Recent Labs Lab 11/28/16 2053  INR 1.68   Cardiac Enzymes: No results for input(s): CKTOTAL, CKMB, CKMBINDEX, TROPONINI in the last 168 hours. BNP (last 3 results) No results for input(s): PROBNP in the last 8760 hours. HbA1C:  Recent Labs  11/29/16 0005  HGBA1C 4.4*   CBG:  Recent Labs Lab 11/29/16 0021 11/29/16 0742  GLUCAP 123* 100*   Lipid Profile: No results for input(s): CHOL, HDL, LDLCALC, TRIG, CHOLHDL, LDLDIRECT in the last 72 hours. Thyroid Function Tests: No results for input(s): TSH, T4TOTAL, FREET4, T3FREE, THYROIDAB in the last 72 hours. Anemia Panel: No results for input(s): VITAMINB12, FOLATE, FERRITIN, TIBC, IRON, RETICCTPCT in the last 72 hours. Urine analysis:    Component Value Date/Time   COLORURINE STRAW (A) 11/28/2016 2148   APPEARANCEUR CLEAR 11/28/2016 2148   LABSPEC 1.013 11/28/2016 2148   PHURINE 7.0 11/28/2016 2148   GLUCOSEU NEGATIVE 11/28/2016 2148   HGBUR NEGATIVE 11/28/2016 2148   BILIRUBINUR NEGATIVE 11/28/2016 2148   KETONESUR NEGATIVE 11/28/2016 2148   PROTEINUR NEGATIVE 11/28/2016 2148   UROBILINOGEN 0.2 10/09/2014 2315   NITRITE NEGATIVE 11/28/2016 2148   LEUKOCYTESUR SMALL (A) 11/28/2016 2148   Sepsis Labs: @LABRCNTIP (procalcitonin:4,lacticidven:4)  )No results found for this or any previous visit (from the past 240 hour(s)).       Radiology Studies: Dg Chest 2 View  Result Date: 11/28/2016 CLINICAL DATA:  Shortness of breath and cough EXAM: CHEST  2 VIEW COMPARISON:  October 22, 2016 FINDINGS: There is no edema or consolidation. The heart size and  pulmonary vascularity are normal. No adenopathy. No bone lesions. There are surgical clips at the gastroesophageal junction. IMPRESSION: No edema or consolidation. Electronically Signed   By: Lowella Grip III M.D.   On: 11/28/2016 21:15        Scheduled Meds: . diltiazem  120 mg Oral Daily  . flecainide  225 mg Oral BID  . insulin aspart  0-15 Units Subcutaneous TID WC  . insulin glargine  8 Units Subcutaneous BID  . ipratropium-albuterol  3 mL Nebulization Q6H  . loratadine  10 mg Oral QPM  . metoprolol succinate  12.5 mg Oral Daily  . nicotine  14 mg Transdermal Daily  . oxybutynin  5 mg Oral Daily  . pantoprazole (PROTONIX) IV  40 mg Intravenous Q12H  . pravastatin  40 mg Oral QPM  . tamsulosin  0.4 mg Oral Daily   Continuous  Infusions:   LOS: 0 days    Time spent: 72min   Domenic Polite, MD [facility] Pager [phone]  If 7PM-7AM, please contact night-coverage [email or web address] Password [email or web address] 11/29/2016, 11:35 AM

## 2016-11-30 ENCOUNTER — Other Ambulatory Visit: Payer: Self-pay | Admitting: Hematology and Oncology

## 2016-11-30 ENCOUNTER — Encounter (HOSPITAL_COMMUNITY): Payer: Self-pay | Admitting: Hematology and Oncology

## 2016-11-30 ENCOUNTER — Encounter: Payer: Self-pay | Admitting: *Deleted

## 2016-11-30 DIAGNOSIS — D500 Iron deficiency anemia secondary to blood loss (chronic): Secondary | ICD-10-CM

## 2016-11-30 DIAGNOSIS — K922 Gastrointestinal hemorrhage, unspecified: Secondary | ICD-10-CM

## 2016-11-30 DIAGNOSIS — Z809 Family history of malignant neoplasm, unspecified: Secondary | ICD-10-CM

## 2016-11-30 DIAGNOSIS — I4891 Unspecified atrial fibrillation: Secondary | ICD-10-CM

## 2016-11-30 DIAGNOSIS — Z87891 Personal history of nicotine dependence: Secondary | ICD-10-CM

## 2016-11-30 LAB — BASIC METABOLIC PANEL
ANION GAP: 3 — AB (ref 5–15)
BUN: 12 mg/dL (ref 6–20)
CO2: 26 mmol/L (ref 22–32)
Calcium: 8.1 mg/dL — ABNORMAL LOW (ref 8.9–10.3)
Chloride: 110 mmol/L (ref 101–111)
Creatinine, Ser: 0.78 mg/dL (ref 0.61–1.24)
GFR calc Af Amer: >60 mL/min (ref >60)
GLUCOSE: 88 mg/dL (ref 65–99)
POTASSIUM: 4 mmol/L (ref 3.5–5.1)
Sodium: 139 mmol/L (ref 135–145)

## 2016-11-30 LAB — CBC
HEMATOCRIT: 28.1 % — AB (ref 39.0–52.0)
Hemoglobin: 8.4 g/dL — ABNORMAL LOW (ref 13.0–17.0)
MCH: 24.5 pg — ABNORMAL LOW (ref 26.0–34.0)
MCHC: 29.9 g/dL — ABNORMAL LOW (ref 30.0–36.0)
MCV: 81.9 fL (ref 78.0–100.0)
Platelets: 244 10*3/uL (ref 150–400)
RBC: 3.43 MIL/uL — AB (ref 4.22–5.81)
RDW: 18.9 % — ABNORMAL HIGH (ref 11.5–15.5)
WBC: 3.4 10*3/uL — AB (ref 4.0–10.5)

## 2016-11-30 LAB — BPAM RBC
BLOOD PRODUCT EXPIRATION DATE: 201809192359
Blood Product Expiration Date: 201809142359
ISSUE DATE / TIME: 201809050042
ISSUE DATE / TIME: 201809050345
UNIT TYPE AND RH: 600
Unit Type and Rh: 600

## 2016-11-30 LAB — TYPE AND SCREEN
ABO/RH(D): A NEG
ANTIBODY SCREEN: NEGATIVE
UNIT DIVISION: 0
UNIT DIVISION: 0

## 2016-11-30 LAB — GLUCOSE, CAPILLARY
GLUCOSE-CAPILLARY: 113 mg/dL — AB (ref 65–99)
Glucose-Capillary: 103 mg/dL — ABNORMAL HIGH (ref 65–99)
Glucose-Capillary: 148 mg/dL — ABNORMAL HIGH (ref 65–99)

## 2016-11-30 MED ORDER — INSULIN GLARGINE 100 UNIT/ML SOLOSTAR PEN: 8.0000 [IU] | PEN_INJECTOR | Freq: Two times a day (BID) | SUBCUTANEOUS | Status: DC

## 2016-11-30 MED ORDER — FERUMOXYTOL INJECTION 510 MG/17 ML
510.0000 mg | Freq: Once | INTRAVENOUS | Status: AC
  Administered 2016-11-30: 510 mg via INTRAVENOUS
  Filled 2016-11-30: qty 17

## 2016-11-30 MED ORDER — LOPERAMIDE HCL 2 MG PO CAPS
2.0000 mg | ORAL_CAPSULE | Freq: Once | ORAL | Status: AC
  Administered 2016-11-30: 2 mg via ORAL
  Filled 2016-11-30: qty 1

## 2016-11-30 MED ORDER — PANTOPRAZOLE SODIUM 40 MG PO TBEC: 40.0000 mg | DELAYED_RELEASE_TABLET | Freq: Two times a day (BID) | ORAL | Status: DC

## 2016-11-30 NOTE — Progress Notes (Signed)
Per Dr. Lebron Conners, message sent to scheduling to schedule pt for lab/MD/3 hr infusion for blood/feraheme for 12/08/16

## 2016-11-30 NOTE — Progress Notes (Signed)
D/c ing to home all d/c instructions given w/ verbal understanding.He wants to eat "supper"prior to d/c and family cannot come so SW will provide a taxi voucher.

## 2016-11-30 NOTE — Discharge Summary (Signed)
Physician Discharge Summary  [name] HYQ:[id number] DOB: [birth year] DOA: 11/28/2016  PCP: Bernerd Limbo, MD  Admit date: 11/28/2016 Discharge date: 11/30/2016  Time spent: 35 minutes  Recommendations for Outpatient Follow-up:  1. PCP Dr. Doreene Nest in one week, - Coumadin stopped due to recurrent GI bleeds 2. Cardiology Dr.Allred, AFib clinic to evaluate for a Watchman's device   Discharge Diagnoses:   Chronic GI blood loss anemia  History of Billroth II gastrojejunostomy with anastomotic ulcer  Chronic iron deficiency anemia  Paroxysmal atrial fibrillation  Mild Cognitive dysfunction/memory loss suspected   Essential hypertension   Chronic diastolic congestive heart failure (HCC)   Type 2 diabetes mellitus (HCC)   Anticoagulation goal of INR 2 to 3, for PAF - CHA2DS2Vasc = 7   Upper GI bleeding   Tobacco use disorder   Anemia   Discharge Condition: stable  Diet recommendation: DM heart healthy  Filed Weights   11/28/16 1956 11/28/16 2349  Weight: 76.7 kg (169 lb) 76.5 kg (168 lb 10.4 oz)    History of present illness:  [name] a 76 y.o.malewith medical history significant of PAF on Coumadin, Diltiazem, and Flecainide; HTN; HLD; s/p billroth 2 and anastomotic ulcer, requiring multiple transfusions; diastolic heart failure; DM; and COPD with ongoing tobacco use presenting because he noticed weakness a couple of days ago. His last EGD was on 07/11/16 and showed moderately severe reflux esophagitis; alarge amount of food (residue) in the stomach; and a patent Billroth II gastrojejunostomy was found, characterized by ulceration. He was subsequently hospitalized for bleeds from 7/8-10 and 7/29-30. In ED, Hgb 6.9, heme positive, INR 1.7 on Coumadin  Hospital Course:   Symptomatic anemia from Recurrent Upper GI bleed -Patient with multiple prior admissionsfor GI bleeding including 4/18 and 7/8 and 7/29 -last EGD with GJ anastomotic ulcer -felt to have chronic GI  blood loss from these lesions exacerbated by Coumadin -complaint with PPI, this was continued, he was given 2units PRBC -[facility] consulted, recommended re-eval of anticoagulation and supportive care with iron etc, they didn't feel that repeat endoscopic evaluation was warranted now -I discussed with patient about stopping coumadin given 4th admission for same bleeding issues since April 2018, I called and discussed with Dr.Harding who agreed with stopping coumadin at this time, and referred him to the Afib clinic with Dr.Allred to be evaluated for a Watchman's device. -also given IV Iron this admission and also seen by hematology and will be set up for periodic Iron infusions in the cancer center  Afib on Coumadin -Patient with long-standing afib -CHA2DS2-VASc score is  5 with an estimated adjusted stroke rate of 6.7%/year -With now 4 bleeds since 4/18, his bleeding risk is quite high and hence I discussed stopping coumadin with patient and its risks and benefits, and patient agrees, I discussed this with his primary cardiologist dr.Harding as well who is also in agreement, he is referred to Afib CLinic to eval for Watchman's device -pt clearly understands that his Stroke risk will be higher while he is off anticoagulation -stopped Coumadin -Continue Cardizem, Flecainide, and Toprol  HTN -On Cardizem and Toprol   Chronic Diastolic CHF -stable, compensated, not on diuretics at home  DM -A1c 6 in 2/17, resumed glocophage  Tobacco dependence -Encouraged cessation   Consultations:  [facility]  Heme Dr.Perlov  Discharge Exam: Vitals:   11/30/16 0540 11/30/16 1416  BP: 119/70 (!) 95/43  Pulse: (!) 55 62  Resp: 18 18  Temp: 97.6 F (36.4 C) 97.7 F (36.5 C)  SpO2: 100% 98%    General:AAOx3 Cardiovascular: S!S2/RRR Respiratory: CTAB  Discharge Instructions   Discharge Instructions    Diet - low sodium heart healthy    Complete by:  As directed    Diet Carb Modified     Complete by:  As directed    Increase activity slowly    Complete by:  As directed      Current Discharge Medication List    CONTINUE these medications which have CHANGED   Details  Insulin Glargine (LANTUS) 100 UNIT/ML Solostar Pen Inject 8 Units into the skin 2 (two) times daily.    pantoprazole (PROTONIX) 40 MG tablet Take 1 tablet (40 mg total) by mouth 2 (two) times daily before a meal.   Associated Diagnoses: Iron deficiency anemia due to chronic blood loss      CONTINUE these medications which have NOT CHANGED   Details  acetaminophen (TYLENOL) 650 MG CR tablet Take 650 mg by mouth 2 (two) times daily as needed for pain.    diltiazem (CARDIZEM CD) 120 MG 24 hr capsule Take 1 capsule (120 mg total) by mouth daily. Qty: 90 capsule, Refills: 3    flecainide (TAMBOCOR) 150 MG tablet Take 1.5 tablets (225 mg total) by mouth 2 (two) times daily. Qty: 270 tablet, Refills: 3    HYDROcodone-acetaminophen (NORCO/VICODIN) 5-325 MG tablet Take 1 tablet by mouth 2 (two) times daily as needed for moderate pain.    loratadine (CLARITIN) 10 MG tablet Take 10 mg by mouth every evening.     metFORMIN (GLUCOPHAGE) 1000 MG tablet Take 1,000 mg by mouth 2 (two) times daily with a meal.     metoprolol succinate (TOPROL-XL) 25 MG 24 hr tablet TAKE 1/2 TABLET  (12.5 MG TOTAL) BY MOUTH DAILY. Qty: 45 tablet, Refills: 3    Multiple Vitamin (MULTIVITAMIN WITH MINERALS) TABS tablet Take 1 tablet by mouth 2 (two) times daily.     nitroGLYCERIN (NITROSTAT) 0.4 MG SL tablet Place 1 tablet (0.4 mg total) under the tongue every 5 (five) minutes x 3 doses as needed for chest pain. Qty: 25 tablet, Refills: 9    oxybutynin (DITROPAN-XL) 5 MG 24 hr tablet Take 5 mg by mouth daily.    polyethylene glycol (MIRALAX / GLYCOLAX) packet Take 17 g by mouth 2 (two) times daily. Qty: 14 each, Refills: 0    pravastatin (PRAVACHOL) 40 MG tablet TAKE 1 TABLET EVERY EVENING Qty: 90 tablet, Refills: 1     senna-docusate (SENOKOT-S) 8.6-50 MG tablet Take 1 tablet by mouth 2 (two) times daily. Qty: 60 tablet, Refills: 0    tamsulosin (FLOMAX) 0.4 MG CAPS capsule Take 0.4 mg by mouth daily.    Associated Diagnoses: Iron deficiency anemia due to chronic blood loss      STOP taking these medications     warfarin (COUMADIN) 5 MG tablet        Allergies  Allergen Reactions  . Flexeril [Cyclobenzaprine] Other (See Comments)    Makes it hard for him to stand   Follow-up Information    Bernerd Limbo, MD. Schedule an appointment as soon as possible for a visit in 1 week(s).   Specialty:  Family Medicine Contact information: [place] [address] [phone]        Thompson Grayer, MD. Schedule an appointment as soon as possible for a visit in 1 month(s).   Specialty:  Cardiology Why:  FOr evaluation for Watchman's device Contact information: [address]  [place] [address] [phone]        Arta Silence, MD. Schedule an appointment as soon as possible for a visit in 4 week(s).   Specialty:  Gastroenterology Contact information: [address] [place] [address] [phone]            The results of significant diagnostics from this hospitalization (including imaging, microbiology, ancillary and laboratory) are listed below for reference.    Significant Diagnostic Studies: Dg Chest 2 View  Result Date: 11/28/2016 CLINICAL DATA:  Shortness of breath and cough EXAM: CHEST  2 VIEW COMPARISON:  October 22, 2016 FINDINGS: There is no edema or consolidation. The heart size and pulmonary vascularity are normal. No adenopathy. No bone lesions. There are surgical clips at the gastroesophageal junction. IMPRESSION: No edema or consolidation. Electronically Signed   By: Lowella Grip III M.D.   On: 11/28/2016 21:15    Microbiology: No results found for this or any previous visit (from the past 240 hour(s)).    Labs: Basic Metabolic Panel:  Recent Labs Lab 11/28/16 2053 11/29/16 0925 11/30/16 0446  NA 139 138 139  K 4.2 3.9 4.0  CL 103 106 110  CO2 27 26 26   GLUCOSE 110* 101* 88  BUN 17 15 12   CREATININE 0.91 0.83 0.78  CALCIUM 8.8* 8.5* 8.1*   Liver Function Tests:  Recent Labs Lab 11/28/16 2053  AST 24  ALT 31  ALKPHOS 65  BILITOT 0.2*  PROT 5.6*  ALBUMIN 3.0*   No results for input(s): LIPASE, AMYLASE in the last 168 hours. No results for input(s): AMMONIA in the last 168 hours. CBC:  Recent Labs Lab 11/28/16 2053 11/29/16 0925 11/30/16 0446  WBC 4.7 4.6 3.4*  NEUTROABS 3.2  --   --   HGB 6.9* 8.7* 8.4*  HCT 23.1* 28.5* 28.1*  MCV 84.0 82.1 81.9  PLT 259 245 244   Cardiac Enzymes: No results for input(s): CKTOTAL, CKMB, CKMBINDEX, TROPONINI in the last 168 hours. BNP: BNP (last 3 results) No results for input(s): BNP in the last 8760 hours.  ProBNP (last 3 results) No results for input(s): PROBNP in the last 8760 hours.  CBG:  Recent Labs Lab 11/29/16 1136 11/29/16 1659 11/29/16 2136 11/30/16 0746 11/30/16 1206  GLUCAP 104* 78 86 103* 148*       SignedDomenic Polite MD.  [facility] 11/30/2016, 2:57 PM

## 2016-11-30 NOTE — Progress Notes (Signed)
Pt is ready to d/c.All d/c instructions given w/ verbal understanding.Taxi called and awaiting ride.

## 2016-11-30 NOTE — Consult Note (Signed)
New Hematology/Oncology Consult   Referral MD:  Dr Ronnette Juniper     Reason for Referral: Iron deficiency anemia due to recurrent bleeding from an anastomotic ulcer   HPI:  [name] is a 76 year old male with extensive past medical history including coronary artery disease, hypertension, atrial fibrillation on anticoagulation with warfarin, hyperlipidemia, history of Billroth II surgery with history of ulcer at the gastrojejunostomy site basement EGD obtained on 07/11/16. Patient is currently admitted to the hospital after presenting with progressive weakness, numbness in bilateral lower extremities, and worsening numbness in the left hand. On admission, he was found to have hemoglobin of 6.9. With normal white blood cell count and platelets. Patient has received 2 units of packed red blood cells on 11/28/16 with significant symptomatic improvement, most recent hemoglobin is 8.4. Patient has had previous blood transfusions for the same problem, receiving 2 units of packed red blood cells in April, 2 units on 10/02/16, and 2 units on 10/22/16. He was considering referral as outpatient to hematology to evaluate for possible parenteral iron supplementation, but ended up admitted to the hospital. Our consultation was requested facilitate arrangements for the outpatient visit.  At the present time, patient is feeling significantly better compared to the time of admission. He any subjective shortness of breath while at rest. He reports complete resolution of paresthesias in the lower extremities in significant improvement of the numbness in his left hand. He denies any chest pain, left shoulder pain, lightheadedness, or dizziness at this time. He does not have any frank melena or hematochezia preceding the recent episode. Testing of his stool admission returned positive for heme.     Past Medical History:  Diagnosis Date  . Anemia    takes Ferrous Sulfate daily  . Arthritis    "all over"  .  Balance problem 01/2014  . CAP (community acquired pneumonia) 09/18/2014  . Cervical radiculopathy due to degenerative joint disease of spine   . COPD (chronic obstructive pulmonary disease) ([place])   . Coronary artery disease, non-occlusive    a. 03/2010 Nonocclusive disease by cath, performed for ST elevations on ECG;  b. 06/2013 Lexi MV: EF 60%, no ischemia.  . Diabetes mellitus type II    takes Metformin and Lantus daily  . Diastolic CHF, chronic ([place])    a. 12/2012 EF 55-60%, diast dysfxn, triv MR, mildly dil LA/RA.  [name] History of blood transfusion 1982   "when I had stomach OR"  . History of bronchitis    1998  . History of gastric ulcer   . HTN (hypertension)    takes Diltiazem daily  . Hyperlipidemia    takes Pravastatin daily  . Joint pain   . PAF (paroxysmal atrial fibrillation) (HCC)    Recurrent after atrial flutter (a. 07/2010 Status post caval tricuspid isthmus ablation by Dr. Midge Aver Metoprolol daily), currently controlled on flecainide plus diltiazem and Coumadin  . Weakness    numbness and tingling both hands  :  Past Surgical History:  Procedure Laterality Date  . ATRIAL ABLATION SURGERY  08/05/10   CTI ablation for atrial flutter by JA  . CARDIAC CATHETERIZATION  2012   nl LV function, no occlusive CAD, PAF  . CARDIOVERSION  12/07/2010    Successful direct current cardioversion with atrial fibrillation to normal sinus rhythm  . CARPAL TUNNEL RELEASE Bilateral 01/30/2014   Procedure: BILATERAL CARPAL TUNNEL RELEASE;  Surgeon: Marianna Payment, MD;  Location: [place] [facility];  Service: Orthopedics;  Laterality: Bilateral;  . CATARACT EXTRACTION W/  INTRAOCULAR LENS  IMPLANT, BILATERAL Bilateral   . COLONOSCOPY [name] 12/02/2013   Procedure: COLONOSCOPY;  Surgeon: Irene Shipper, MD;  Location: [facility];  Service: Endoscopy;  Laterality: [name];  . ESOPHAGOGASTRODUODENOSCOPY [name] 09/22/2014   Procedure: ESOPHAGOGASTRODUODENOSCOPY (EGD);  Surgeon: Ronald Lobo, MD;  Location[facility]  ENDOSCOPY;  Service: Endoscopy;  Laterality: [name];  . ESOPHAGOGASTRODUODENOSCOPY [name] 07/11/2016   Procedure: ESOPHAGOGASTRODUODENOSCOPY (EGD);  Surgeon: Doran Stabler, MD;  Location: [facility] ENDOSCOPY;  Service: Endoscopy;  Laterality: [name];  . INCISION AND DRAINAGE ABSCESS / HEMATOMA OF BURSA / KNEE / THIGH Left 1998   knee  . KNEE BURSECTOMY Left 1998  . LAPAROSCOPIC CHOLECYSTECTOMY  03/2010  . NM MYOVIEW LTD  07/22/2013   Normal EF ~60%, no ischemia or infarction.  [name] PARTIAL GASTRECTOMY  1982   subtotal; "took out 30% for ulcers"  . TRANSTHORACIC ECHOCARDIOGRAM  02/16/2014   EF 60%, no RWMA. - otherwise normal  . YAG LASER APPLICATION Bilateral   :   Current Facility-Administered Medications:  .  acetaminophen (TYLENOL) tablet 650 mg, 650 mg, Oral, Q6H PRN **OR** acetaminophen (TYLENOL) suppository 650 mg, 650 mg, Rectal, Q6H PRN, Karmen Bongo, MD .  albuterol (PROVENTIL) (2.5 MG/3ML) 0.083% nebulizer solution 2.5 mg, 2.5 mg, Nebulization, Q2H PRN, Karmen Bongo, MD .  diltiazem (CARDIZEM CD) 24 hr capsule 120 mg, 120 mg, Oral, Daily, Karmen Bongo, MD, 120 mg at 11/30/16 0932 .  flecainide (TAMBOCOR) tablet 225 mg, 225 mg, Oral, BID, Karmen Bongo, MD, 225 mg at 11/30/16 6712 .  HYDROcodone-acetaminophen (NORCO/VICODIN) 5-325 MG per tablet 1 tablet, 1 tablet, Oral, BID PRN, Karmen Bongo, MD, 1 tablet at 11/30/16 0934 .  insulin aspart (novoLOG) injection 0-15 Units, 0-15 Units, Subcutaneous, TID WC, Karmen Bongo, MD, 2 Units at 11/30/16 1208 .  insulin glargine (LANTUS) injection 8 Units, 8 Units, Subcutaneous, BID, Karmen Bongo, MD, 8 Units at 11/30/16 [phone] .  ipratropium-albuterol (DUONEB) 0.5-2.5 (3) MG/3ML nebulizer solution 3 mL, 3 mL, Nebulization, Q6H PRN, Domenic Polite, MD .  loratadine (CLARITIN) tablet 10 mg, 10 mg, Oral, QPM, Karmen Bongo, MD, 10 mg at 11/29/16 1743 .  metoprolol succinate (TOPROL-XL) 24 hr tablet 12.5 mg, 12.5 mg, Oral, Daily, Karmen Bongo, MD, 12.5 mg at 11/29/16 1745 .  nicotine (NICODERM CQ - dosed in mg/24 hours) patch 14 mg, 14 mg, Transdermal, Daily, Karmen Bongo, MD, 14 mg at 11/30/16 0923 .  ondansetron (ZOFRAN) tablet 4 mg, 4 mg, Oral, Q6H PRN **OR** ondansetron (ZOFRAN) injection 4 mg, 4 mg, Intravenous, Q6H PRN, Karmen Bongo, MD .  oxybutynin (DITROPAN-XL) 24 hr tablet 5 mg, 5 mg, Oral, Daily, Karmen Bongo, MD, 5 mg at 11/30/16 9983 .  pantoprazole (PROTONIX) injection 40 mg, 40 mg, Intravenous, Q12H, Karmen Bongo, MD, 40 mg at 11/30/16 0923 .  pravastatin (PRAVACHOL) tablet 40 mg, 40 mg, Oral, QPM, Karmen Bongo, MD, 40 mg at 11/29/16 1744 .  tamsulosin (FLOMAX) capsule 0.4 mg, 0.4 mg, Oral, Daily, Karmen Bongo, MD, 0.4 mg at 11/30/16 3825:  . diltiazem  120 mg Oral Daily  . flecainide  225 mg Oral BID  . insulin aspart  0-15 Units Subcutaneous TID WC  . insulin glargine  8 Units Subcutaneous BID  . loratadine  10 mg Oral QPM  . metoprolol succinate  12.5 mg Oral Daily  . nicotine  14 mg Transdermal Daily  . oxybutynin  5 mg Oral Daily  . pantoprazole (PROTONIX) IV  40 mg Intravenous Q12H  . pravastatin  40 mg  Oral QPM  . tamsulosin  0.4 mg Oral Daily  :  Allergies  Allergen Reactions  . Flexeril [Cyclobenzaprine] Other (See Comments)    Makes it hard for him to stand  :   Family History  Problem Relation Age of Onset  . Cancer Mother   . Heart attack Father   . Heart attack Brother     SOCIAL HISTORY:  Social History   Social History  . Marital status: Widowed    Spouse name: [name]  . Number of children: [name]  . Years of education: [name]   Occupational History  . Retired    Social History Main Topics  . Smoking status: Current Every Day Smoker    Packs/day: 0.50    Years: 55.00    Types: Cigarettes  . Smokeless tobacco: Never Used     Comment: He's been smoking between 0.5 and 2 ppd since age 32.  [name] Alcohol use No     Comment: "quit drinking in 1986"  . Drug  use: No  . Sexual activity: No   Other Topics Concern  . Not on file   Social History Narrative   He is a widower, who moved to [place] in 2011 to live closer to his daughter. He formerly lived in [place]. He is a father of 18, grandfather, and great-grandfather of 44. Does not drink alcohol, context of smoker.    Review of Systems:  Positives include:  Fatigue, chronic musculoskeletal pains which have not gotten worse recently. Dyspnea with exertion  A complete ROS was otherwise negative.   Physical Exam:  Blood pressure (!) 95/43, pulse 62, temperature 97.7 F (36.5 C), temperature source Oral, resp. rate 18, height 5\' 6"  (1.676 m), weight 168 lb 10.4 oz (76.5 kg), SpO2 98 %.  HEENT: Conjunctival pallor without scleral icterus or ulcerations. Lungs: Bilateral expiratory wheezing noted. Decreased breath sounds bilaterally Cardiac: Irregularly irregular, no murmurs appreciated. Abdomen: Soft, nontender nondistended. Vascular: No extremity edema or abnormal vascular pattern since. Lymph nodes: No palpable lymphadenopathy in the bilateral cervical, supraclavicular, axillary, or inguinal regions Neurologic: No gross focal neurological deficit Skin: Pale skin without jaundice Musculoskeletal: Unremarkable  LABS:   Recent Labs  11/29/16 0925 11/30/16 0446  WBC 4.6 3.4*  HGB 8.7* 8.4*  HCT 28.5* 28.1*  PLT 245 244     Recent Labs  11/29/16 0925 11/30/16 0446  NA 138 139  K 3.9 4.0  CL 106 110  CO2 26 26  GLUCOSE 101* 88  BUN 15 12  CREATININE 0.83 0.78  CALCIUM 8.5* 8.1*      RADIOLOGY:  Dg Chest 2 View  Result Date: 11/28/2016 CLINICAL DATA:  Shortness of breath and cough EXAM: CHEST  2 VIEW COMPARISON:  October 22, 2016 FINDINGS: There is no edema or consolidation. The heart size and pulmonary vascularity are normal. No adenopathy. No bone lesions. There are surgical clips at the gastroesophageal junction. IMPRESSION: No edema or consolidation.  Electronically Signed   By: Lowella Grip III M.D.   On: 11/28/2016 21:15    Assessment and Plan:  76 year old male with recurrent gastrointestinal bleeding due to known ulceration at the site of gastrojejunal anastomosis from previous surgery. Previous laboratory demonstrates severe iron depletion which does not appear to be responding to oral iron supplementation, patient does require recurrent blood transfusions with 8 units total administered over the past 6 months according to our records. Patient responds well to transfusions, with symptomatic threshold at approximately 8.0 g/dL of Hgb. Patient  has previously been evaluated by Hematology at [facility], NP & Dr Lattie Haw) who are planning on initiating parenteral iron supplementation with Feraheme next Friday based on the information available in the chart.  Recommendations: --Continue supportive care as per Drs Orson Gear Brahmbhatt & Dr Ronnette Juniper with discharge by the clinical progress of the patient. Ultimately, her permanent solution to the recurrent bleeding would be optimal --Start parenteral iron replacement therapy with Feraheme as planned by Dr Marin Olp and Laverna Peace, NP next week. I have contacted them to suggest an adjustment to the course as to include a continugent blood transfusion support with 1-2 units pRBC if Hgb <=8.0 on presentation to the clinic --At the present time, the transfusions the patient has received will provide him a good amount of iron and initiation of iron infusion while hospitalized is not indicated. --I will continue following the patient while he remains hospitalized, but he'll return him to the care of his usual hematological providers on discharge.   Ardath Sax, MD 11/30/2016, 3:06 PM

## 2016-11-30 NOTE — Progress Notes (Signed)
[facility] Gastroenterology Progress Note  [name] 76 y.o. [birth year]  CC:   Symptomatic anemia, history of anastomotic ulcer   Subjective:  Patient denied any abdominal pain. Denied black stool or blood in the stool. He wants to advance his diet.  ROS : Negative for chest pain and shortness of breath   Objective: Vital signs in last 24 hours: Vitals:   11/29/16 2129 11/30/16 0540  BP: (!) 93/48 119/70  Pulse: (!) 50 (!) 55  Resp: 16 18  Temp: 97.6 F (36.4 C) 97.6 F (36.4 C)  SpO2: 100% 100%    Physical Exam:  General:  Alert, cooperative, no distress, appears stated age  Head:  Normocephalic, without obvious abnormality, atraumatic  Eyes:  , EOM's intact,   Lungs:   Clear to auscultation bilaterally, respirations unlabored  Heart:  Regular rate and rhythm, S1, S2 normal  Abdomen:   Soft, non-tender, bowel sounds active all four quadrants,  no masses,   Extremities: Extremities normal, atraumatic, no  edema  Pulses: 2+ and symmetric    Lab Results:  Recent Labs  11/29/16 0925 11/30/16 0446  NA 138 139  K 3.9 4.0  CL 106 110  CO2 26 26  GLUCOSE 101* 88  BUN 15 12  CREATININE 0.83 0.78  CALCIUM 8.5* 8.1*    Recent Labs  11/28/16 2053  AST 24  ALT 31  ALKPHOS 65  BILITOT 0.2*  PROT 5.6*  ALBUMIN 3.0*    Recent Labs  11/28/16 2053 11/29/16 0925 11/30/16 0446  WBC 4.7 4.6 3.4*  NEUTROABS 3.2  --   --   HGB 6.9* 8.7* 8.4*  HCT 23.1* 28.5* 28.1*  MCV 84.0 82.1 81.9  PLT 259 245 244    Recent Labs  11/28/16 2053  LABPROT 19.7*  INR 1.68      Assessment/Plan: - Recurrent anemia without any recent overt melena or bright red blood per rectum. Multiple EGD and colonoscopy in recent past. - History of chronic anastomotic ulcer - History of Billroth II surgery for gastric ulcer in the past - Paroxysmal atrial fibrillation. On Coumadin. Currently on hold.  Recommendations --------------------------- - Long discussion with the patient  regarding further management options. I think patient may not be absorbing his oral iron properly. I have discussed with hematology for possible IV iron infusion. They will see patient while in the hospital. - He may need outpatient surgical evaluation if he continues to have recurrent anemia despite of being on IV iron. - Patient remains at risk of recurrent GI bleed from anastomotic ulcer and risk and benefits of anticoagulation needs to be addressed properly. - Continue PPI for now. Advance diet to soft. - GI will follow.   Otis Brace MD, [place] 11/30/2016, 9:41 AM  Pager [phone]  If no answer or after 5 PM call [phone]

## 2016-12-01 ENCOUNTER — Telehealth: Payer: Self-pay | Admitting: *Deleted

## 2016-12-01 ENCOUNTER — Encounter (HOSPITAL_COMMUNITY): Payer: Self-pay

## 2016-12-01 ENCOUNTER — Emergency Department (HOSPITAL_COMMUNITY)
Admission: EM | Admit: 2016-12-01 | Discharge: 2016-12-01 | Disposition: A | Discharge status: Home / Self Care | Payer: Medicare Other | Attending: Emergency Medicine | Admitting: Emergency Medicine

## 2016-12-01 ENCOUNTER — Other Ambulatory Visit: Payer: Medicare Other

## 2016-12-01 ENCOUNTER — Ambulatory Visit: Payer: Medicare Other | Admitting: Hematology & Oncology

## 2016-12-01 ENCOUNTER — Emergency Department (HOSPITAL_COMMUNITY): Payer: Medicare Other

## 2016-12-01 DIAGNOSIS — E119 Type 2 diabetes mellitus without complications: Secondary | ICD-10-CM | POA: Insufficient documentation

## 2016-12-01 DIAGNOSIS — I110 Hypertensive heart disease with heart failure: Secondary | ICD-10-CM | POA: Diagnosis not present

## 2016-12-01 DIAGNOSIS — I2510 Atherosclerotic heart disease of native coronary artery without angina pectoris: Secondary | ICD-10-CM | POA: Insufficient documentation

## 2016-12-01 DIAGNOSIS — R1013 Epigastric pain: Secondary | ICD-10-CM | POA: Diagnosis not present

## 2016-12-01 DIAGNOSIS — M6281 Muscle weakness (generalized): Secondary | ICD-10-CM | POA: Insufficient documentation

## 2016-12-01 DIAGNOSIS — R0602 Shortness of breath: Secondary | ICD-10-CM | POA: Insufficient documentation

## 2016-12-01 DIAGNOSIS — R531 Weakness: Secondary | ICD-10-CM | POA: Diagnosis not present

## 2016-12-01 DIAGNOSIS — R110 Nausea: Secondary | ICD-10-CM | POA: Insufficient documentation

## 2016-12-01 DIAGNOSIS — J449 Chronic obstructive pulmonary disease, unspecified: Secondary | ICD-10-CM | POA: Insufficient documentation

## 2016-12-01 DIAGNOSIS — R5383 Other fatigue: Secondary | ICD-10-CM | POA: Diagnosis not present

## 2016-12-01 DIAGNOSIS — Z794 Long term (current) use of insulin: Secondary | ICD-10-CM | POA: Insufficient documentation

## 2016-12-01 DIAGNOSIS — Z79899 Other long term (current) drug therapy: Secondary | ICD-10-CM | POA: Diagnosis not present

## 2016-12-01 DIAGNOSIS — F17210 Nicotine dependence, cigarettes, uncomplicated: Secondary | ICD-10-CM | POA: Diagnosis not present

## 2016-12-01 DIAGNOSIS — I5032 Chronic diastolic (congestive) heart failure: Secondary | ICD-10-CM | POA: Insufficient documentation

## 2016-12-01 DIAGNOSIS — R404 Transient alteration of awareness: Secondary | ICD-10-CM | POA: Diagnosis not present

## 2016-12-01 DIAGNOSIS — R42 Dizziness and giddiness: Secondary | ICD-10-CM | POA: Diagnosis present

## 2016-12-01 LAB — URINALYSIS, ROUTINE W REFLEX MICROSCOPIC
BILIRUBIN URINE: NEGATIVE
Bacteria, UA: NONE SEEN
Glucose, UA: NEGATIVE mg/dL
HGB URINE DIPSTICK: NEGATIVE
Ketones, ur: NEGATIVE mg/dL
NITRITE: NEGATIVE
PROTEIN: NEGATIVE mg/dL
Specific Gravity, Urine: 1.003 — ABNORMAL LOW (ref 1.005–1.030)
Squamous Epithelial / LPF: NONE SEEN
pH: 7 (ref 5.0–8.0)

## 2016-12-01 LAB — HEPATIC FUNCTION PANEL
ALT: 38 U/L (ref 17–63)
AST: 29 U/L (ref 15–41)
Albumin: 3.4 g/dL — ABNORMAL LOW (ref 3.5–5.0)
Alkaline Phosphatase: 82 U/L (ref 38–126)
Bilirubin, Direct: <0.1 mg/dL — ABNORMAL LOW (ref 0.1–0.5)
TOTAL PROTEIN: 5.9 g/dL — AB (ref 6.5–8.1)
Total Bilirubin: 0.6 mg/dL (ref 0.3–1.2)

## 2016-12-01 LAB — CBC
HCT: 29.9 % — ABNORMAL LOW (ref 39.0–52.0)
HEMOGLOBIN: 9.1 g/dL — AB (ref 13.0–17.0)
MCH: 25.1 pg — ABNORMAL LOW (ref 26.0–34.0)
MCHC: 30.4 g/dL (ref 30.0–36.0)
MCV: 82.6 fL (ref 78.0–100.0)
Platelets: 277 10*3/uL (ref 150–400)
RBC: 3.62 MIL/uL — AB (ref 4.22–5.81)
RDW: 18.7 % — ABNORMAL HIGH (ref 11.5–15.5)
WBC: 6.1 10*3/uL (ref 4.0–10.5)

## 2016-12-01 LAB — I-STAT CG4 LACTIC ACID, ED: LACTIC ACID, VENOUS: 0.74 mmol/L (ref 0.5–1.9)

## 2016-12-01 LAB — BASIC METABOLIC PANEL
ANION GAP: 6 (ref 5–15)
BUN: 14 mg/dL (ref 6–20)
CALCIUM: 8.6 mg/dL — AB (ref 8.9–10.3)
CO2: 25 mmol/L (ref 22–32)
Chloride: 106 mmol/L (ref 101–111)
Creatinine, Ser: 1.04 mg/dL (ref 0.61–1.24)
Glucose, Bld: 119 mg/dL — ABNORMAL HIGH (ref 65–99)
Potassium: 4.4 mmol/L (ref 3.5–5.1)
Sodium: 137 mmol/L (ref 135–145)

## 2016-12-01 LAB — BRAIN NATRIURETIC PEPTIDE: B NATRIURETIC PEPTIDE 5: 141.7 pg/mL — AB (ref 0.0–100.0)

## 2016-12-01 LAB — CBG MONITORING, ED: Glucose-Capillary: 122 mg/dL — ABNORMAL HIGH (ref 65–99)

## 2016-12-01 LAB — PROTIME-INR
INR: 1.25
Prothrombin Time: 15.6 seconds — ABNORMAL HIGH (ref 11.4–15.2)

## 2016-12-01 LAB — I-STAT TROPONIN, ED: Troponin i, poc: 0 ng/mL (ref 0.00–0.08)

## 2016-12-01 MED ORDER — SODIUM CHLORIDE 0.9 % IV BOLUS (SEPSIS)
500.0000 mL | Freq: Once | INTRAVENOUS | Status: AC
  Administered 2016-12-01: 500 mL via INTRAVENOUS

## 2016-12-01 NOTE — ED Notes (Signed)
Pt ambulated out of the ED in no distress. Taxi called for pt. A&Ox4.

## 2016-12-01 NOTE — ED Triage Notes (Signed)
Per EMs- Patient was discharged from the hospital yesterday and when he woke today he felt weaker than when he was discharged. Patient also reports that he was told he had bleeding but they could nt find out where he was bleeding from. Patient reports that he received iron while hospitalized.

## 2016-12-01 NOTE — Discharge Instructions (Signed)
Your workup today showed improving anemia, no evidence of kidney problems, and your liver function was normal. I do not think you are severely dehydrated and I feel you are safe for discharge home. We did not find evidence of new infections. Please follow-up with your primary care physician or reassessment and further management in the next several days. If any symptoms change or worsen, please return to the nearest emergency department.

## 2016-12-01 NOTE — Telephone Encounter (Signed)
Received call from patient. The call quality was very poor and only a few words were intelligible.   Patient was stating that he was recently discharged from the hospital. He continues to have issues with weakness. He also states he's alone and has no one to help him. He states he can't drive. It's difficult to understand what his other symptoms are. He repeats several times calling an ambulance.   Since patient's symptoms cannot be worked up completely, there is no one else at the home to speak to, there isn't another phone line that can be called, it was suggested to the patient to call EMS for emergent assessment.   Patient agreed to call EMS

## 2016-12-01 NOTE — ED Notes (Signed)
Bed: WA20 Expected date: 12/01/16 Expected time: 2:52 PM Means of arrival:  Comments: Triage 3

## 2016-12-01 NOTE — ED Provider Notes (Signed)
[facility] DEPT Provider Note   CSN: [id number] Arrival date & time: 12/01/16  1427     History   Chief Complaint Chief Complaint  Patient presents with  . Weakness    HPI [name] is a 76 y.o. male.  The history is provided by the patient and medical records.  Dizziness  Quality:  Lightheadedness Severity:  Moderate Onset quality:  Gradual Duration:  1 week Timing:  Constant Progression:  Unchanged Chronicity:  Recurrent Context: not with loss of consciousness   Relieved by:  Nothing Worsened by:  Nothing Ineffective treatments:  None tried Associated symptoms: nausea, shortness of breath and weakness (generalized)   Associated symptoms: no chest pain, no diarrhea, no headaches, no palpitations, no vision changes and no vomiting   Nausea:    Severity:  Mild   Onset quality:  Gradual   Timing:  Constant Shortness of breath:    Severity:  Mild   Onset quality:  Gradual   Timing:  Constant Risk factors: anemia and heart disease     Past Medical History:  Diagnosis Date  . Anemia    takes Ferrous Sulfate daily  . Arthritis    "all over"  . Balance problem 01/2014  . CAP (community acquired pneumonia) 09/18/2014  . Cervical radiculopathy due to degenerative joint disease of spine   . COPD (chronic obstructive pulmonary disease) ([place])   . Coronary artery disease, non-occlusive    a. 03/2010 Nonocclusive disease by cath, performed for ST elevations on ECG;  b. 06/2013 Lexi MV: EF 60%, no ischemia.  . Diabetes mellitus type II    takes Metformin and Lantus daily  . Diastolic CHF, chronic ([place])    a. 12/2012 EF 55-60%, diast dysfxn, triv MR, mildly dil LA/RA.  [name] History of blood transfusion 1982   "when I had stomach OR"  . History of bronchitis    1998  . History of gastric ulcer   . HTN (hypertension)    takes Diltiazem daily  . Hyperlipidemia    takes Pravastatin daily  . Joint pain   . PAF (paroxysmal atrial fibrillation) (HCC)    Recurrent  after atrial flutter (a. 07/2010 Status post caval tricuspid isthmus ablation by Dr. Midge Aver Metoprolol daily), currently controlled on flecainide plus diltiazem and Coumadin  . Weakness    numbness and tingling both hands    Patient Active Problem List   Diagnosis Date Noted  . Acute blood loss anemia 10/02/2016  . Abdominal pain 10/02/2016  . Constipation 10/02/2016  . Left lower quadrant pain   . Coagulopathy ([place])   . Heme positive stool   . Symptomatic anemia 07/10/2016  . Syncope 05/15/2015  . Cervical disc disease 05/15/2015  . Abnormal urinalysis 05/15/2015  . Anemia 05/15/2015  . History of CVA (cerebrovascular accident) 04/03/2014  . Near syncope 02/15/2014  . Pre-syncope 02/15/2014  . Benign neoplasm of rectum and anal canal 12/02/2013  . Upper GI bleeding 11/30/2013  . Tobacco use disorder 11/30/2013  . PAF (paroxysmal atrial fibrillation), maintaining SR; CHA2DS2Vasc = 7 08/15/2013  . Anticoagulation goal of INR 2 to 3, for PAF - CHA2DS2Vasc = 7 08/15/2013  . Type 2 diabetes mellitus ([place]) 01/22/2013  . Tobacco abuse counseling 12/15/2012  . Hyperlipidemia   . Obesity (BMI 30-39.9) 12/14/2012  . Atrial flutter - status post CTI ablation 07/29/2010  . Essential hypertension 07/29/2010  . Chronic diastolic congestive heart failure ([place]) 07/29/2010  . Coronary artery disease, non-occlusive 07/29/2010    Past Surgical History:  Procedure Laterality Date  . ATRIAL ABLATION SURGERY  08/05/10   CTI ablation for atrial flutter by JA  . CARDIAC CATHETERIZATION  2012   nl LV function, no occlusive CAD, PAF  . CARDIOVERSION  12/07/2010    Successful direct current cardioversion with atrial fibrillation to normal sinus rhythm  . CARPAL TUNNEL RELEASE Bilateral 01/30/2014   Procedure: BILATERAL CARPAL TUNNEL RELEASE;  Surgeon: Marianna Payment, MD;  Location: [facility];  Service: Orthopedics;  Laterality: Bilateral;  . CATARACT EXTRACTION W/ INTRAOCULAR LENS  IMPLANT,  BILATERAL Bilateral   . COLONOSCOPY [name] 12/02/2013   Procedure: COLONOSCOPY;  Surgeon: Irene Shipper, MD;  Location: [facility];  Service: Endoscopy;  Laterality: [name];  . ESOPHAGOGASTRODUODENOSCOPY [name] 09/22/2014   Procedure: ESOPHAGOGASTRODUODENOSCOPY (EGD);  Surgeon: Ronald Lobo, MD;  Location: [facility] ENDOSCOPY;  Service: Endoscopy;  Laterality: [name];  . ESOPHAGOGASTRODUODENOSCOPY [name] 07/11/2016   Procedure: ESOPHAGOGASTRODUODENOSCOPY (EGD);  Surgeon: Doran Stabler, MD;  Location: [facility] ENDOSCOPY;  Service: Endoscopy;  Laterality: [name];  . INCISION AND DRAINAGE ABSCESS / HEMATOMA OF BURSA / KNEE / THIGH Left 1998   knee  . KNEE BURSECTOMY Left 1998  . LAPAROSCOPIC CHOLECYSTECTOMY  03/2010  . NM MYOVIEW LTD  07/22/2013   Normal EF ~60%, no ischemia or infarction.  [name] PARTIAL GASTRECTOMY  1982   subtotal; "took out 30% for ulcers"  . TRANSTHORACIC ECHOCARDIOGRAM  02/16/2014   EF 60%, no RWMA. - otherwise normal  . YAG LASER APPLICATION Bilateral        Home Medications    Prior to Admission medications   Medication Sig Start Date End Date Taking? Authorizing Provider  acetaminophen (TYLENOL) 650 MG CR tablet Take 650 mg by mouth 2 (two) times daily as needed for pain.    [provider]  diltiazem (CARDIZEM CD) 120 MG 24 hr capsule Take 1 capsule (120 mg total) by mouth daily. 10/25/16   Strader, Fransisco Hertz, PA-C  flecainide (TAMBOCOR) 150 MG tablet Take 1.5 tablets (225 mg total) by mouth 2 (two) times daily. 10/25/16   Strader, Fransisco Hertz, PA-C  HYDROcodone-acetaminophen (NORCO/VICODIN) 5-325 MG tablet Take 1 tablet by mouth 2 (two) times daily as needed for moderate pain.    [provider]  Insulin Glargine (LANTUS) 100 UNIT/ML Solostar Pen Inject 8 Units into the skin 2 (two) times daily. 11/30/16   Domenic Polite, MD  loratadine (CLARITIN) 10 MG tablet Take 10 mg by mouth every evening.     [provider]  metFORMIN (GLUCOPHAGE) 1000 MG tablet Take 1,000 mg by  mouth 2 (two) times daily with a meal.     [provider]  metoprolol succinate (TOPROL-XL) 25 MG 24 hr tablet TAKE 1/2 TABLET  (12.5 MG TOTAL) BY MOUTH DAILY. 10/25/16   Strader, Fransisco Hertz, PA-C  Multiple Vitamin (MULTIVITAMIN WITH MINERALS) TABS tablet Take 1 tablet by mouth 2 (two) times daily.     [provider]  nitroGLYCERIN (NITROSTAT) 0.4 MG SL tablet Place 1 tablet (0.4 mg total) under the tongue every 5 (five) minutes x 3 doses as needed for chest pain. 11/19/15   Erlene Quan, PA-C  oxybutynin (DITROPAN-XL) 5 MG 24 hr tablet Take 5 mg by mouth daily. 11/02/16 11/02/17  [provider]  pantoprazole (PROTONIX) 40 MG tablet Take 1 tablet (40 mg total) by mouth 2 (two) times daily before a meal. 11/30/16   Domenic Polite, MD  polyethylene glycol [facility] / Floria Raveling) packet Take 17 g by mouth 2 (two)  times daily. 10/03/16   Sheikh, Omair Latif, DO  pravastatin (PRAVACHOL) 40 MG tablet TAKE 1 TABLET EVERY EVENING Patient taking differently: TAKE 40 MG BY MOUTH EVERY EVENING 09/22/16   Leonie Man, MD  senna-docusate (SENOKOT-S) 8.6-50 MG tablet Take 1 tablet by mouth 2 (two) times daily. 10/03/16   Raiford Noble Latif, DO  tamsulosin (FLOMAX) 0.4 MG CAPS capsule Take 0.4 mg by mouth daily.  10/01/16   [provider]    Family History Family History  Problem Relation Age of Onset  . Cancer Mother   . Heart attack Father   . Heart attack Brother     Social History Social History  Substance Use Topics  . Smoking status: Current Every Day Smoker    Packs/day: 0.50    Years: 55.00    Types: Cigarettes  . Smokeless tobacco: Never Used     Comment: He's been smoking between 0.5 and 2 ppd since age 89.  [name] Alcohol use No     Comment: "quit drinking in 1986"     Allergies   Flexeril [cyclobenzaprine]   Review of Systems Review of Systems  Constitutional: Positive for fatigue. Negative for chills, diaphoresis and fever.  HENT: Negative for  congestion.   Eyes: Negative for visual disturbance.  Respiratory: Positive for shortness of breath. Negative for cough, chest tightness, wheezing and stridor.   Cardiovascular: Negative for chest pain, palpitations and leg swelling.  Gastrointestinal: Positive for abdominal pain and nausea. Negative for constipation, diarrhea and vomiting.  Genitourinary: Negative for dysuria, flank pain and frequency.  Musculoskeletal: Negative for back pain, neck pain and neck stiffness.  Neurological: Positive for weakness (generalized) and light-headedness. Negative for dizziness, numbness and headaches.  Psychiatric/Behavioral: Negative for agitation.  All other systems reviewed and are negative.    Physical Exam Updated Vital Signs BP (!) 103/49 (BP Location: Left Arm)   Pulse (!) 52   Temp 98.3 F (36.8 C) (Oral)   Resp 16   Ht 5\' 6"  (1.676 m)   Wt 74.8 kg (165 lb)   SpO2 97%   BMI 26.63 kg/m   Physical Exam  Constitutional: He is oriented to person, place, and time. He appears well-developed and well-nourished. No distress.  HENT:  Head: Normocephalic.  Nose: Nose normal.  Mouth/Throat: Oropharynx is clear and moist. No oropharyngeal exudate.  Eyes: Pupils are equal, round, and reactive to light. Conjunctivae and EOM are normal.  Neck: Normal range of motion.  Cardiovascular: Normal rate, regular rhythm and intact distal pulses.   No murmur heard. Pulmonary/Chest: Effort normal. No stridor. No respiratory distress. He has no wheezes. He exhibits no tenderness.  Abdominal: Soft. Bowel sounds are normal. There is tenderness (mild epigastric tenderness).  Musculoskeletal: He exhibits no tenderness.  Neurological: He is alert and oriented to person, place, and time. No cranial nerve deficit or sensory deficit. He exhibits normal muscle tone.  Skin: Capillary refill takes less than 2 seconds. He is not diaphoretic. No erythema. No pallor.  Psychiatric: He has a normal mood and affect.    Nursing note and vitals reviewed.    ED Treatments / Results  Labs (all labs ordered are listed, but only abnormal results are displayed) Labs Reviewed  BASIC METABOLIC PANEL - Abnormal; Notable for the following:       Result Value   Glucose, Bld 119 (*)    Calcium 8.6 (*)    All other components within normal limits  CBC - Abnormal; Notable for the following:  RBC 3.62 (*)    Hemoglobin 9.1 (*)    HCT 29.9 (*)    MCH 25.1 (*)    RDW 18.7 (*)    All other components within normal limits  URINALYSIS, ROUTINE W REFLEX MICROSCOPIC - Abnormal; Notable for the following:    Color, Urine STRAW (*)    Specific Gravity, Urine 1.003 (*)    Leukocytes, UA TRACE (*)    All other components within normal limits  HEPATIC FUNCTION PANEL - Abnormal; Notable for the following:    Total Protein 5.9 (*)    Albumin 3.4 (*)    Bilirubin, Direct <0.1 (*)    All other components within normal limits  PROTIME-INR - Abnormal; Notable for the following:    Prothrombin Time 15.6 (*)    All other components within normal limits  BRAIN NATRIURETIC PEPTIDE - Abnormal; Notable for the following:    B Natriuretic Peptide 141.7 (*)    All other components within normal limits  CBG MONITORING, ED - Abnormal; Notable for the following:    Glucose-Capillary 122 (*)    All other components within normal limits  URINE CULTURE  I-STAT CG4 LACTIC ACID, ED  I-STAT TROPONIN, ED    EKG  EKG Interpretation  Date/Time:  Friday December 01 2016 15:00:37 EDT Ventricular Rate:  56 PR Interval:    QRS Duration: 104 QT Interval:  444 QTC Calculation: 429 R Axis:   -3 Text Interpretation:  Sinus or ectopic atrial rhythm Prolonged PR interval Probable anteroseptal infarct, old ST elevation suggests acute pericarditis When comapred to prior, no significant changes seen.  No STEMI Confirmed by Antony Blackbird [phone]) on 12/01/2016 3:58:57 PM       Radiology Dg Chest 2 View  Result Date:  12/01/2016 CLINICAL DATA:  Weakness.  Hypertension. EXAM: CHEST  2 VIEW COMPARISON:  November 28, 2016 FINDINGS: There is slight left lower lobe atelectasis. There is no edema or consolidation. Heart size and pulmonary vascularity are normal. No adenopathy. There is mild degenerative change in the thoracic spine. There are surgical clips at the gastroesophageal junction as well as in the right upper quadrant of the abdomen. IMPRESSION: Mild left base atelectasis. No edema or consolidation. Stable cardiac silhouette. Electronically Signed   By: Lowella Grip III M.D.   On: 12/01/2016 16:47    Procedures Procedures (including critical care time)  Medications Ordered in ED Medications  sodium chloride 0.9 % bolus 500 mL (0 mLs Intravenous Stopped 12/01/16 1936)     Initial Impression / Assessment and Plan / ED Course  I have reviewed the triage vital signs and the nursing notes.  Pertinent labs & imaging results that were available during my care of the patient were reviewed by me and considered in my medical decision making (see chart for details).     Syncere Eble is a 76 y.o. male with a past medical history significant for CHF, hypertension, hyperlipidemia, CAD, diabetes, atrial fibrillation previously on Coumadin but currently being held due to recurrent GI bleeds who was discharged from the hospital yesterday who presents back for nausea, mild shortness of breath, epigastric aching, severe fatigue, lightheadedness, and generalized weakness. Patient says that he was told that he was "bleeding and make it to find where". Patient says that he was discharged home and is concerned he is still having severe fatigue symptoms. He says that he is too tired to walk around his house. He reports mild shortness of breath and mild abdominal aching with nausea but  no vomiting. He denies fevers, chills, chest pain, palpitations, constipation, diarrhea, or any urinary changes.   On exam, patient's lungs  are clear. Legs are not significantly swollen. No leg tenderness unilaterally. Patient's abdomen nontender. Chest nontender. Patient has pulses in upper and lower extremity. Patient had no focal neurologic deficits on my exam. Patient rest.  Given patient's discharge yesterday with report of anemia and his symptoms of shortness of breath, severe fatigue, and some nausea, patient will have workup to look for continued anemia r occult infection leading to his symptoms.  Anticipate follow-up on lab testing and imaging.  Initial EKG showed a prolonged PR but otherwise sinus rhythm. No convincing evidence of acute ischemia.     Diagnostic testing results are seen above. Patient had improving hemoglobin up to 9.1. Patient had no convincing evidence of UTI. Metabolic panel is reassuring. CBC shows no leukocytosis. Chest x-ray shows no pneumonia.  Patient felt better for some fluids. Given reassuring workup, do not feel patient needs admission or further workup in t. Patient was felt stable for discharge home. Patient will follow with his PCP in several days and strict return precautions were understood. Patient discharged in good condition.   Final Clinical Impressions(s) / ED Diagnoses   Final diagnoses:  Fatigue, unspecified type    New Prescriptions Discharge Medication List as of 12/01/2016  7:19 PM     Clinical Impression: 1. Fatigue, unspecified type     Disposition: Discharge  Condition: Good  I have discussed the results, Dx and Tx plan with the pt(& family if present). He/she/they expressed understanding and agree(s) with the plan. Discharge instructions discussed at great length. Strict return precautions discussed and pt &/or family have verbalized understanding of the instructions. No further questions at time of discharge.    Discharge Medication List as of 12/01/2016  7:19 PM      Follow Up: Bernerd Limbo, MD [address] [place]  [address] [phone]  Schedule an appointment as soon as possible for a visit    [facility] DEPT [place] [id number] [place] [phone]  If symptoms worsen     Daeron Carreno, Gwenyth Allegra, MD 12/02/16 [phone]

## 2016-12-02 LAB — URINE CULTURE: Culture: NO GROWTH

## 2016-12-05 ENCOUNTER — Telehealth (HOSPITAL_COMMUNITY): Payer: Self-pay | Admitting: *Deleted

## 2016-12-05 NOTE — Telephone Encounter (Signed)
-----   Message -----  From: Domenic Polite, MD  Sent: 11/30/2016  2:47 PM  To: Sherran Needs, NP  Subject: Consider Watchman's device            Hi Butch Penny  this is Dr. Broadus John would like to refer Mr.[name] to the Afib clinic to Dr. [name], for evaluation for watchman's device.  This gentleman has had 4 episodes of upper GI bleeding in 3 months, while on Coumadin for A. Fib, we have stopped his Coumadin this admission however given his stroke risk/chads vasc score of 5, and wanted to see if he would be a candidate for a Watchman's device.  Also discussed case with patient's primary cardiologist Dr. Glenetta Hew who suggested that I message you.  Thank you  Domenic Polite    Attempted to call patient to set up appointment -- no voicemail box set up. Will try again later.

## 2016-12-06 ENCOUNTER — Encounter (HOSPITAL_COMMUNITY): Payer: Self-pay | Admitting: *Deleted

## 2016-12-06 NOTE — Telephone Encounter (Signed)
Unable to reach by phone. Letter sent requesting pt to call to make appointment.

## 2016-12-08 ENCOUNTER — Other Ambulatory Visit (HOSPITAL_BASED_OUTPATIENT_CLINIC_OR_DEPARTMENT_OTHER): Payer: Medicare Other

## 2016-12-08 ENCOUNTER — Ambulatory Visit (HOSPITAL_BASED_OUTPATIENT_CLINIC_OR_DEPARTMENT_OTHER): Payer: Medicare Other | Admitting: Family

## 2016-12-08 ENCOUNTER — Telehealth: Payer: Self-pay | Admitting: *Deleted

## 2016-12-08 VITALS — BP 116/51 | HR 63 | Temp 97.6°F | Resp 19 | Wt 169.0 lb

## 2016-12-08 DIAGNOSIS — I480 Paroxysmal atrial fibrillation: Secondary | ICD-10-CM

## 2016-12-08 DIAGNOSIS — D500 Iron deficiency anemia secondary to blood loss (chronic): Secondary | ICD-10-CM

## 2016-12-08 DIAGNOSIS — I4891 Unspecified atrial fibrillation: Secondary | ICD-10-CM | POA: Diagnosis not present

## 2016-12-08 DIAGNOSIS — K922 Gastrointestinal hemorrhage, unspecified: Secondary | ICD-10-CM

## 2016-12-08 LAB — PROTIME-INR (CHCC SATELLITE)
INR: 1 — ABNORMAL LOW (ref 2.0–3.5)
PROTIME: 12 s (ref 10.6–13.4)

## 2016-12-08 LAB — CBC WITH DIFFERENTIAL (CANCER CENTER ONLY)
BASO#: 0 10*3/uL (ref 0.0–0.2)
BASO%: 0.9 % (ref 0.0–2.0)
EOS ABS: 0.2 10*3/uL (ref 0.0–0.5)
EOS%: 4.3 % (ref 0.0–7.0)
HEMATOCRIT: 35.8 % — AB (ref 38.7–49.9)
HEMOGLOBIN: 10.8 g/dL — AB (ref 13.0–17.1)
LYMPH#: 0.8 10*3/uL — AB (ref 0.9–3.3)
LYMPH%: 17.6 % (ref 14.0–48.0)
MCH: 27 pg — AB (ref 28.0–33.4)
MCHC: 30.2 g/dL — AB (ref 32.0–35.9)
MCV: 90 fL (ref 82–98)
MONO#: 0.5 10*3/uL (ref 0.1–0.9)
MONO%: 10.4 % (ref 0.0–13.0)
NEUT%: 66.8 % (ref 40.0–80.0)
NEUTROS ABS: 3 10*3/uL (ref 1.5–6.5)
Platelets: 266 10*3/uL (ref 145–400)
RBC: 4 10*6/uL — ABNORMAL LOW (ref 4.20–5.70)
RDW: 22 % — ABNORMAL HIGH (ref 11.1–15.7)
WBC: 4.4 10*3/uL (ref 4.0–10.0)

## 2016-12-08 LAB — FERRITIN: Ferritin: 100 ng/ml (ref 22–316)

## 2016-12-08 LAB — IRON AND TIBC
%SAT: 13 % — AB (ref 20–55)
Iron: 46 ug/dL (ref 42–163)
TIBC: 364 ug/dL (ref 202–409)
UIBC: 318 ug/dL (ref 117–376)

## 2016-12-08 NOTE — Telephone Encounter (Signed)
-----   Message from Eliezer Bottom, NP sent at 12/08/2016 11:39 AM EDT ----- Regarding: Iron  He will need one dose of IV iron next week. Scheduling message sent to [facility]. Thank you!  Sarah  ----- Message ----- From: Interface, Lab In Three Zero One Sent: 12/08/2016   8:12 AM To: Eliezer Bottom, NP

## 2016-12-08 NOTE — Progress Notes (Signed)
Hematology and Oncology Follow Up Visit  Jaman Aro [id number] 1940-04-14 76 y.o. 12/08/2016   Principle Diagnosis:  Iron deficiency anemia secondary to intermittent GI bleed    Current Therapy:   IV iron as indicated Supportive transfusions as needed    Interim History:  Mr. Gandolfo is here today with his daughter for follow-up. He was hospitalized last week for anemia and received 2 units of blood as well as IV iron. His Hgb today is 10.8 with an MCV of 90. His symptoms have resolved and he has no complaints at this time.  He denies noticing any more blood in his stool. No bruising or petechiae.  Coumadin was stopped due to history of multiple GI bleeds and he has been referred to Dr. [name] in cardiology for work up for [facility] device.  He follows up with Dr. Paulita Fujita later this month. He is taking Protonix 40 mg PO BID.  He denies fever, chills, n/v, cough, rash, dizziness, SOB, chest pain, palpitations, abdominal pain or changes in bowel or bladder habits.  No lymphadenopathy found on exam.  No swelling, tenderness, numbness or tingling in his extremities. No c/o pain.  He has maintained a good appetite and is staying well hydrated. His weight is stable.  He ambulates with his cane for support and denies any falls or syncopal episodes.   ECOG Performance Status: 0 - Asymptomatic  Medications:  Allergies as of 12/08/2016      Reactions   Flexeril [cyclobenzaprine] Other (See Comments)   Makes it hard for him to stand      Medication List       Accurate as of 12/08/16  8:52 AM. Always use your most recent med list.          acetaminophen 650 MG CR tablet Commonly known as:  TYLENOL Take 650 mg by mouth 2 (two) times daily as needed for pain.   diltiazem 120 MG 24 hr capsule Commonly known as:  CARDIZEM CD Take 1 capsule (120 mg total) by mouth daily.   flecainide 150 MG tablet Commonly known as:  TAMBOCOR Take 1.5 tablets (225 mg total) by mouth 2 (two) times  daily.   HYDROcodone-acetaminophen 5-325 MG tablet Commonly known as:  NORCO/VICODIN Take 1 tablet by mouth 2 (two) times daily as needed for moderate pain.   Insulin Glargine 100 UNIT/ML Solostar Pen Commonly known as:  LANTUS Inject 8 Units into the skin 2 (two) times daily.   loratadine 10 MG tablet Commonly known as:  CLARITIN Take 10 mg by mouth every evening.   metFORMIN 1000 MG tablet Commonly known as:  GLUCOPHAGE Take 1,000 mg by mouth 2 (two) times daily with a meal.   metoprolol succinate 25 MG 24 hr tablet Commonly known as:  TOPROL-XL TAKE 1/2 TABLET  (12.5 MG TOTAL) BY MOUTH DAILY.   multivitamin with minerals Tabs tablet Take 1 tablet by mouth daily.   nitroGLYCERIN 0.4 MG SL tablet Commonly known as:  NITROSTAT Place 1 tablet (0.4 mg total) under the tongue every 5 (five) minutes x 3 doses as needed for chest pain.   oxybutynin 5 MG 24 hr tablet Commonly known as:  DITROPAN-XL Take 5 mg by mouth daily.   pantoprazole 40 MG tablet Commonly known as:  PROTONIX Take 1 tablet (40 mg total) by mouth 2 (two) times daily before a meal.   polyethylene glycol packet Commonly known as:  MIRALAX / GLYCOLAX Take 17 g by mouth 2 (two) times daily.   pravastatin  40 MG tablet Commonly known as:  PRAVACHOL TAKE 1 TABLET EVERY EVENING   senna-docusate 8.6-50 MG tablet Commonly known as:  Senokot-S Take 1 tablet by mouth 2 (two) times daily.   tamsulosin 0.4 MG Caps capsule Commonly known as:  FLOMAX Take 0.4 mg by mouth daily.       Allergies:  Allergies  Allergen Reactions  . Flexeril [Cyclobenzaprine] Other (See Comments)    Makes it hard for him to stand    Past Medical History, Surgical history, Social history, and Family History were reviewed and updated.  Review of Systems: All other 10 point review of systems is negative.   Physical Exam:  weight is 169 lb (76.7 kg). His oral temperature is 97.6 F (36.4 C). His blood pressure is 116/51  (abnormal) and his pulse is 63. His respiration is 19 and oxygen saturation is 98%.   Wt Readings from Last 3 Encounters:  12/08/16 169 lb (76.7 kg)  12/01/16 165 lb (74.8 kg)  11/28/16 168 lb 10.4 oz (76.5 kg)    Ocular: Sclerae unicteric, pupils equal, round and reactive to light Ear-nose-throat: Oropharynx clear, dentition fair Lymphatic: No cervical, supraclavicular or axillary adenopathy Lungs no rales or rhonchi, good excursion bilaterally Heart regular rate and rhythm, no murmur appreciated Abd soft, nontender, positive bowel sounds, no liver or spleen tip palpated on exam, no fluid wave MSK no focal spinal tenderness, no joint edema Neuro: non-focal, well-oriented, appropriate affect Breasts: Deferred   Lab Results  Component Value Date   WBC 4.4 12/08/2016   HGB 10.8 (L) 12/08/2016   HCT 35.8 (L) 12/08/2016   MCV 90 12/08/2016   PLT 266 12/08/2016   Lab Results  Component Value Date   FERRITIN 6 (L) 10/20/2016   IRON 14 (L) 10/20/2016   TIBC 401 10/20/2016   UIBC 388 (H) 10/20/2016   IRONPCTSAT 3 (L) 10/20/2016   Lab Results  Component Value Date   RETICCTPCT 3.7 (H) 05/15/2015   RBC 4.00 (L) 12/08/2016   No results found for: KPAFRELGTCHN, LAMBDASER, KAPLAMBRATIO No results found for: IGGSERUM, IGA, IGMSERUM No results found for: Odetta Pink, SPEI   Chemistry      Component Value Date/Time   NA 137 12/01/2016 1519   NA 139 10/20/2016 1110   K 4.4 12/01/2016 1519   K 4.0 10/20/2016 1110   CL 106 12/01/2016 1519   CO2 25 12/01/2016 1519   CO2 26 10/20/2016 1110   BUN 14 12/01/2016 1519   BUN 15.2 10/20/2016 1110   CREATININE 1.04 12/01/2016 1519   CREATININE 0.8 10/20/2016 1110      Component Value Date/Time   CALCIUM 8.6 (L) 12/01/2016 1519   CALCIUM 9.6 10/20/2016 1110   ALKPHOS 82 12/01/2016 1619   ALKPHOS 85 10/20/2016 1110   AST 29 12/01/2016 1619   AST 33 10/20/2016 1110   ALT 38  12/01/2016 1619   ALT 42 10/20/2016 1110   BILITOT 0.6 12/01/2016 1619   BILITOT <0.22 10/20/2016 1110      Impression and Plan: Mr. Lanni is a very pleasant 76 yo caucasian gentleman with anemia secondary to GI bleed on anticogulantion. His Coumadin was stopped last week and he was given blood and IV iron during admission. He is feeling much better and has no complaints at this time.  He will follow-up with Dr. [name] and Dr. Paulita Fujita later this month.  We wills ee what his iron studies show and bring him back in  next week for infusion if needed.  We will plan to see him back in another month for repeat lab work and follow-up.  Both he and his family know to contact our office with any questions or concerns. We can certainly see him sooner if need be.   Eliezer Bottom, NP 9/14/20188:52 AM

## 2016-12-11 LAB — RETICULOCYTES: Reticulocyte Count: 3.7 % — ABNORMAL HIGH (ref 0.6–2.6)

## 2016-12-13 ENCOUNTER — Telehealth: Payer: Self-pay | Admitting: *Deleted

## 2016-12-13 ENCOUNTER — Encounter: Payer: Self-pay | Admitting: Internal Medicine

## 2016-12-13 NOTE — Telephone Encounter (Addendum)
Patient is aware of results. Appointment made  ----- Message from Eliezer Bottom, NP sent at 12/08/2016 11:39 AM EDT ----- Regarding: Iron  He will need one dose of IV iron next week. Scheduling message sent to [facility]. Thank you!  Sarah  ----- Message ----- From: Interface, Lab In Three Zero One Sent: 12/08/2016   8:12 AM To: Eliezer Bottom, NP

## 2016-12-14 ENCOUNTER — Ambulatory Visit (HOSPITAL_BASED_OUTPATIENT_CLINIC_OR_DEPARTMENT_OTHER): Payer: Medicare Other

## 2016-12-14 VITALS — BP 131/66 | HR 88 | Temp 98.0°F | Resp 16

## 2016-12-14 DIAGNOSIS — D500 Iron deficiency anemia secondary to blood loss (chronic): Secondary | ICD-10-CM | POA: Diagnosis present

## 2016-12-14 DIAGNOSIS — D508 Other iron deficiency anemias: Secondary | ICD-10-CM

## 2016-12-14 MED ORDER — SODIUM CHLORIDE 0.9 % IV SOLN
510.0000 mg | Freq: Once | INTRAVENOUS | Status: AC
  Administered 2016-12-14: 510 mg via INTRAVENOUS
  Filled 2016-12-14: qty 17

## 2016-12-14 MED ORDER — SODIUM CHLORIDE 0.9 % IV SOLN
Freq: Once | INTRAVENOUS | Status: AC
  Administered 2016-12-14: 12:00:00 via INTRAVENOUS

## 2016-12-14 NOTE — Patient Instructions (Addendum)
Ferumoxytol injection What is this medicine? FERUMOXYTOL is an iron complex. Iron is used to make healthy red blood cells, which carry oxygen and nutrients throughout the body. This medicine is used to treat iron deficiency anemia in people with chronic kidney disease. This medicine may be used for other purposes; ask your health care provider or pharmacist if you have questions. COMMON BRAND NAME(S): Feraheme What should I tell my health care provider before I take this medicine? They need to know if you have any of these conditions: -anemia not caused by low iron levels -high levels of iron in the blood -magnetic resonance imaging (MRI) test scheduled -an unusual or allergic reaction to iron, other medicines, foods, dyes, or preservatives -pregnant or trying to get pregnant -breast-feeding How should I use this medicine? This medicine is for injection into a vein. It is given by a health care professional in a hospital or clinic setting. Talk to your pediatrician regarding the use of this medicine in children. Special care may be needed. Overdosage: If you think you have taken too much of this medicine contact a poison control center or emergency room at once. NOTE: This medicine is only for you. Do not share this medicine with others. What if I miss a dose? It is important not to miss your dose. Call your doctor or health care professional if you are unable to keep an appointment. What may interact with this medicine? This medicine may interact with the following medications: -other iron products This list may not describe all possible interactions. Give your health care provider a list of all the medicines, herbs, non-prescription drugs, or dietary supplements you use. Also tell them if you smoke, drink alcohol, or use illegal drugs. Some items may interact with your medicine. What should I watch for while using this medicine? Visit your doctor or healthcare professional regularly. Tell  your doctor or healthcare professional if your symptoms do not start to get better or if they get worse. You may need blood work done while you are taking this medicine. You may need to follow a special diet. Talk to your doctor. Foods that contain iron include: whole grains/cereals, dried fruits, beans, or peas, leafy green vegetables, and organ meats (liver, kidney). What side effects may I notice from receiving this medicine? Side effects that you should report to your doctor or health care professional as soon as possible: -allergic reactions like skin rash, itching or hives, swelling of the face, lips, or tongue -breathing problems -changes in blood pressure -feeling faint or lightheaded, falls -fever or chills -flushing, sweating, or hot feelings -swelling of the ankles or feet Side effects that usually do not require medical attention (report to your doctor or health care professional if they continue or are bothersome): -diarrhea -headache -nausea, vomiting -stomach pain This list may not describe all possible side effects. Call your doctor for medical advice about side effects. You may report side effects to FDA at 1-800-FDA-1088. Where should I keep my medicine? This drug is given in a hospital or clinic and will not be stored at home. NOTE: This sheet is a summary. It may not cover all possible information. If you have questions about this medicine, talk to your doctor, pharmacist, or health care provider.  2018 Elsevier/Gold Standard (2015-04-15 12:41:49) Ferumoxytol injection What is this medicine? FERUMOXYTOL is an iron complex. Iron is used to make healthy red blood cells, which carry oxygen and nutrients throughout the body. This medicine is used to treat iron deficiency   anemia in people with chronic kidney disease. This medicine may be used for other purposes; ask your health care provider or pharmacist if you have questions. COMMON BRAND NAME(S): Feraheme What should I  tell my health care provider before I take this medicine? They need to know if you have any of these conditions: -anemia not caused by low iron levels -high levels of iron in the blood -magnetic resonance imaging (MRI) test scheduled -an unusual or allergic reaction to iron, other medicines, foods, dyes, or preservatives -pregnant or trying to get pregnant -breast-feeding How should I use this medicine? This medicine is for injection into a vein. It is given by a health care professional in a hospital or clinic setting. Talk to your pediatrician regarding the use of this medicine in children. Special care may be needed. Overdosage: If you think you have taken too much of this medicine contact a poison control center or emergency room at once. NOTE: This medicine is only for you. Do not share this medicine with others. What if I miss a dose? It is important not to miss your dose. Call your doctor or health care professional if you are unable to keep an appointment. What may interact with this medicine? This medicine may interact with the following medications: -other iron products This list may not describe all possible interactions. Give your health care provider a list of all the medicines, herbs, non-prescription drugs, or dietary supplements you use. Also tell them if you smoke, drink alcohol, or use illegal drugs. Some items may interact with your medicine. What should I watch for while using this medicine? Visit your doctor or healthcare professional regularly. Tell your doctor or healthcare professional if your symptoms do not start to get better or if they get worse. You may need blood work done while you are taking this medicine. You may need to follow a special diet. Talk to your doctor. Foods that contain iron include: whole grains/cereals, dried fruits, beans, or peas, leafy green vegetables, and organ meats (liver, kidney). What side effects may I notice from receiving this  medicine? Side effects that you should report to your doctor or health care professional as soon as possible: -allergic reactions like skin rash, itching or hives, swelling of the face, lips, or tongue -breathing problems -changes in blood pressure -feeling faint or lightheaded, falls -fever or chills -flushing, sweating, or hot feelings -swelling of the ankles or feet Side effects that usually do not require medical attention (report to your doctor or health care professional if they continue or are bothersome): -diarrhea -headache -nausea, vomiting -stomach pain This list may not describe all possible side effects. Call your doctor for medical advice about side effects. You may report side effects to FDA at 1-800-FDA-1088. Where should I keep my medicine? This drug is given in a hospital or clinic and will not be stored at home. NOTE: This sheet is a summary. It may not cover all possible information. If you have questions about this medicine, talk to your doctor, pharmacist, or health care provider.  2018 Elsevier/Gold Standard (2015-04-15 12:41:49)  

## 2016-12-15 ENCOUNTER — Other Ambulatory Visit: Payer: Medicare Other

## 2016-12-15 ENCOUNTER — Ambulatory Visit: Payer: Medicare Other | Admitting: Hematology and Oncology

## 2016-12-15 ENCOUNTER — Ambulatory Visit: Payer: Medicare Other

## 2016-12-22 ENCOUNTER — Encounter (HOSPITAL_COMMUNITY): Payer: Self-pay | Admitting: Nurse Practitioner

## 2016-12-22 ENCOUNTER — Ambulatory Visit (HOSPITAL_COMMUNITY)
Admission: RE | Admit: 2016-12-22 | Discharge: 2016-12-22 | Disposition: A | Discharge status: Home / Self Care | Payer: Medicare Other | Source: Ambulatory Visit | Attending: Nurse Practitioner | Admitting: Nurse Practitioner

## 2016-12-22 VITALS — BP 112/56 | Ht 66.0 in | Wt 170.4 lb

## 2016-12-22 DIAGNOSIS — Z79899 Other long term (current) drug therapy: Secondary | ICD-10-CM | POA: Insufficient documentation

## 2016-12-22 DIAGNOSIS — F17210 Nicotine dependence, cigarettes, uncomplicated: Secondary | ICD-10-CM | POA: Diagnosis not present

## 2016-12-22 DIAGNOSIS — I480 Paroxysmal atrial fibrillation: Secondary | ICD-10-CM | POA: Diagnosis not present

## 2016-12-22 DIAGNOSIS — I5032 Chronic diastolic (congestive) heart failure: Secondary | ICD-10-CM | POA: Diagnosis not present

## 2016-12-22 DIAGNOSIS — E119 Type 2 diabetes mellitus without complications: Secondary | ICD-10-CM | POA: Insufficient documentation

## 2016-12-22 DIAGNOSIS — Z8711 Personal history of peptic ulcer disease: Secondary | ICD-10-CM | POA: Insufficient documentation

## 2016-12-22 DIAGNOSIS — M1990 Unspecified osteoarthritis, unspecified site: Secondary | ICD-10-CM | POA: Insufficient documentation

## 2016-12-22 DIAGNOSIS — M4722 Other spondylosis with radiculopathy, cervical region: Secondary | ICD-10-CM | POA: Diagnosis not present

## 2016-12-22 DIAGNOSIS — E785 Hyperlipidemia, unspecified: Secondary | ICD-10-CM | POA: Insufficient documentation

## 2016-12-22 DIAGNOSIS — D649 Anemia, unspecified: Secondary | ICD-10-CM | POA: Diagnosis not present

## 2016-12-22 DIAGNOSIS — Z888 Allergy status to other drugs, medicaments and biological substances status: Secondary | ICD-10-CM | POA: Diagnosis not present

## 2016-12-22 DIAGNOSIS — Z9842 Cataract extraction status, left eye: Secondary | ICD-10-CM | POA: Insufficient documentation

## 2016-12-22 DIAGNOSIS — I110 Hypertensive heart disease with heart failure: Secondary | ICD-10-CM | POA: Insufficient documentation

## 2016-12-22 DIAGNOSIS — J449 Chronic obstructive pulmonary disease, unspecified: Secondary | ICD-10-CM | POA: Insufficient documentation

## 2016-12-22 DIAGNOSIS — Z961 Presence of intraocular lens: Secondary | ICD-10-CM | POA: Diagnosis not present

## 2016-12-22 DIAGNOSIS — I2510 Atherosclerotic heart disease of native coronary artery without angina pectoris: Secondary | ICD-10-CM | POA: Diagnosis not present

## 2016-12-22 DIAGNOSIS — Z9841 Cataract extraction status, right eye: Secondary | ICD-10-CM | POA: Diagnosis not present

## 2016-12-22 DIAGNOSIS — Z903 Acquired absence of stomach [part of]: Secondary | ICD-10-CM | POA: Diagnosis not present

## 2016-12-22 DIAGNOSIS — Z8249 Family history of ischemic heart disease and other diseases of the circulatory system: Secondary | ICD-10-CM | POA: Diagnosis not present

## 2016-12-22 DIAGNOSIS — Z794 Long term (current) use of insulin: Secondary | ICD-10-CM | POA: Insufficient documentation

## 2016-12-22 NOTE — Progress Notes (Signed)
Primary Care Physician: Bernerd Limbo, MD Referring Physician: Dr. Milana Huntsman [name] is a 76 y.o. male with a h/o  significant of PAF on Coumadin, Diltiazem, and Flecainide; HTN; HLD; s/p billroth 2 and anastomotic ulcer, requiring multiple transfusions; diastolic heart failure; DM; and COPD with ongoing tobacco use, admitted 9/4 thru 9/6, presenting with weakness.[name] His last EGD was on 07/11/16 and showed moderately severe reflux esophagitis; alarge amount of food (residue) in the stomach; and a patent Billroth II gastrojejunostomy was found, characterized by ulceration. He was subsequently hospitalized for bleeds from 7/8-10 and 7/29-30.In ED, Hgb 6.9, heme positive, INR 1.7 on Coumadin  He was seen by GI and it was recommended revisiting the indication for anticoagulation and avoiding warfarin or antiplatelets or NSAIDs as much as possible.It was decided by Dr. [name] to stop coumadin. He is in the afib clinic to discuss Watchman. He is maintaining SR.  Today, he denies symptoms of palpitations, chest pain, shortness of breath, orthopnea, PND, lower extremity edema, dizziness, presyncope, syncope, or neurologic sequela. The patient is tolerating medications without difficulties and is otherwise without complaint today.   Past Medical History:  Diagnosis Date  . Anemia    takes Ferrous Sulfate daily  . Arthritis    "all over"  . Balance problem 01/2014  . CAP (community acquired pneumonia) 09/18/2014  . Cervical radiculopathy due to degenerative joint disease of spine   . COPD (chronic obstructive pulmonary disease) ([place])   . Coronary artery disease, non-occlusive    a. 03/2010 Nonocclusive disease by cath, performed for ST elevations on ECG;  b. 06/2013 Lexi MV: EF 60%, no ischemia.  . Diabetes mellitus type II    takes Metformin and Lantus daily  . Diastolic CHF, chronic ([place])    a. 12/2012 EF 55-60%, diast dysfxn, triv MR, mildly dil LA/RA.  [name] History of blood transfusion  1982   "when I had stomach OR"  . History of bronchitis    1998  . History of gastric ulcer   . HTN (hypertension)    takes Diltiazem daily  . Hyperlipidemia    takes Pravastatin daily  . Joint pain   . PAF (paroxysmal atrial fibrillation) ([facility])    Recurrent after atrial flutter (a. 07/2010 Status post caval tricuspid isthmus ablation by Dr. Midge Aver Metoprolol daily), currently controlled on flecainide plus diltiazem and Coumadin  . Weakness    numbness and tingling both hands   Past Surgical History:  Procedure Laterality Date  . ATRIAL ABLATION SURGERY  08/05/10   CTI ablation for atrial flutter by JA  . CARDIAC CATHETERIZATION  2012   nl LV function, no occlusive CAD, PAF  . CARDIOVERSION  12/07/2010    Successful direct current cardioversion with atrial fibrillation to normal sinus rhythm  . CARPAL TUNNEL RELEASE Bilateral 01/30/2014   Procedure: BILATERAL CARPAL TUNNEL RELEASE;  Surgeon: Marianna Payment, MD;  Location: [facility];  Service: Orthopedics;  Laterality: Bilateral;  . CATARACT EXTRACTION W/ INTRAOCULAR LENS  IMPLANT, BILATERAL Bilateral   . COLONOSCOPY [name] 12/02/2013   Procedure: COLONOSCOPY;  Surgeon: Irene Shipper, MD;  Location: [facility];  Service: Endoscopy;  Laterality: [name];  . ESOPHAGOGASTRODUODENOSCOPY [name] 09/22/2014   Procedure: ESOPHAGOGASTRODUODENOSCOPY (EGD);  Surgeon: Ronald Lobo, MD;  Location: [facility] ENDOSCOPY;  Service: Endoscopy;  Laterality: [name];  . ESOPHAGOGASTRODUODENOSCOPY [name] 07/11/2016   Procedure: ESOPHAGOGASTRODUODENOSCOPY (EGD);  Surgeon: Doran Stabler, MD;  Location: [facility] ENDOSCOPY;  Service: Endoscopy;  Laterality: [name];  . INCISION AND  DRAINAGE ABSCESS / HEMATOMA OF BURSA / KNEE / THIGH Left 1998   knee  . KNEE BURSECTOMY Left 1998  . LAPAROSCOPIC CHOLECYSTECTOMY  03/2010  . NM MYOVIEW LTD  07/22/2013   Normal EF ~60%, no ischemia or infarction.  [name] PARTIAL GASTRECTOMY  1982   subtotal; "took out 30% for ulcers"  . TRANSTHORACIC  ECHOCARDIOGRAM  02/16/2014   EF 60%, no RWMA. - otherwise normal  . YAG LASER APPLICATION Bilateral     Current Outpatient Prescriptions  Medication Sig Dispense Refill  . acetaminophen (TYLENOL) 650 MG CR tablet Take 650 mg by mouth 2 (two) times daily as needed for pain.    [name] co-enzyme Q-10 30 MG capsule Take 30 mg by mouth 3 (three) times daily.    [name] diltiazem (CARDIZEM CD) 120 MG 24 hr capsule Take 1 capsule (120 mg total) by mouth daily. 90 capsule 3  . ferrous sulfate 325 (65 FE) MG EC tablet Take 325 mg by mouth daily with breakfast.    . flecainide (TAMBOCOR) 150 MG tablet Take 1.5 tablets (225 mg total) by mouth 2 (two) times daily. 270 tablet 3  . HYDROcodone-acetaminophen (NORCO/VICODIN) 5-325 MG tablet Take 1 tablet by mouth 2 (two) times daily as needed for moderate pain.    . Insulin Glargine (LANTUS) 100 UNIT/ML Solostar Pen Inject 8 Units into the skin 2 (two) times daily.    [name] loratadine (CLARITIN) 10 MG tablet Take 10 mg by mouth every evening.     . metFORMIN (GLUCOPHAGE) 1000 MG tablet Take 1,000 mg by mouth 2 (two) times daily with a meal.     . metoprolol succinate (TOPROL-XL) 25 MG 24 hr tablet TAKE 1/2 TABLET  (12.5 MG TOTAL) BY MOUTH DAILY. 45 tablet 3  . Multiple Vitamin (MULTIVITAMIN WITH MINERALS) TABS tablet Take 1 tablet by mouth daily.     . nitroGLYCERIN (NITROSTAT) 0.4 MG SL tablet Place 1 tablet (0.4 mg total) under the tongue every 5 (five) minutes x 3 doses as needed for chest pain. 25 tablet 9  . oxybutynin (DITROPAN-XL) 5 MG 24 hr tablet Take 5 mg by mouth daily.    . pantoprazole (PROTONIX) 40 MG tablet Take 1 tablet (40 mg total) by mouth 2 (two) times daily before a meal.    . polyethylene glycol (MIRALAX / GLYCOLAX) packet Take 17 g by mouth 2 (two) times daily. (Patient taking differently: Take 17 g by mouth daily as needed for moderate constipation. ) 14 each 0  . pravastatin (PRAVACHOL) 40 MG tablet TAKE 1 TABLET EVERY EVENING (Patient taking  differently: TAKE 40 MG BY MOUTH EVERY EVENING) 90 tablet 1  . senna-docusate (SENOKOT-S) 8.6-50 MG tablet Take 1 tablet by mouth 2 (two) times daily. (Patient taking differently: Take 1 tablet by mouth 2 (two) times daily as needed for mild constipation. ) 60 tablet 0  . tamsulosin (FLOMAX) 0.4 MG CAPS capsule Take 0.4 mg by mouth daily.      No current facility-administered medications for this encounter.     Allergies  Allergen Reactions  . Flexeril [Cyclobenzaprine] Other (See Comments)    Makes it hard for him to stand    Social History   Social History  . Marital status: Widowed    Spouse name: [name]  . Number of children: [name]  . Years of education: [name]   Occupational History  . Retired    Social History Main Topics  . Smoking status: Current Every Day Smoker    Packs/day:  0.50    Years: 55.00    Types: Cigarettes  . Smokeless tobacco: Never Used     Comment: He's been smoking between 0.5 and 2 ppd since age 45.  [name] Alcohol use No     Comment: "quit drinking in 1986"  . Drug use: No  . Sexual activity: No   Other Topics Concern  . Not on file   Social History Narrative   He is a widower, who moved to [place] in 2011 to live closer to his daughter. He formerly lived in [place]. He is a father of 48, grandfather, and great-grandfather of 67. Does not drink alcohol, context of smoker.    Family History  Problem Relation Age of Onset  . Cancer Mother   . Heart attack Father   . Heart attack Brother     ROS- All systems are reviewed and negative except as per the HPI above  Physical Exam: Vitals:   12/22/16 0831  BP: (!) 112/56  Weight: 170 lb 6.4 oz (77.3 kg)  Height: 5\' 6"  (1.676 m)   Wt Readings from Last 3 Encounters:  12/22/16 170 lb 6.4 oz (77.3 kg)  12/08/16 169 lb (76.7 kg)  12/01/16 165 lb (74.8 kg)    Labs: Lab Results  Component Value Date   NA 137 12/01/2016   K 4.4 12/01/2016   CL 106 12/01/2016   CO2 25 12/01/2016   GLUCOSE  119 (H) 12/01/2016   BUN 14 12/01/2016   CREATININE 1.04 12/01/2016   CALCIUM 8.6 (L) 12/01/2016   PHOS 3.1 10/03/2016   MG 1.9 10/03/2016   Lab Results  Component Value Date   INR 1.0 (L) 12/08/2016   Lab Results  Component Value Date   CHOL 155 07/22/2013   HDL 37 (L) 07/22/2013   LDLCALC 72 07/22/2013   TRIG 229 (H) 07/22/2013     GEN- The patient is well appearing, alert and oriented x 3 today.   Head- normocephalic, atraumatic Eyes-  Sclera clear, conjunctiva pink Ears- hearing intact Oropharynx- clear Neck- supple, no JVP Lymph- no cervical lymphadenopathy Lungs- Clear to ausculation bilaterally, normal work of breathing Heart- Regular rate and rhythm, no murmurs, rubs or gallops, PMI not laterally displaced GI- soft, NT, ND, + BS Extremities- no clubbing, cyanosis, or edema MS- no significant deformity or atrophy Skin- no rash or lesion Psych- euthymic mood, full affect Neuro- strength and sensation are intact  EKG- Sinus rhythm at 75 bpm, Pr int 346 ms, qrs int 112 ms, qtc 466 ms    Assessment and Plan: 1. Paroxymal afib Maintaining SR with flecainide He is on 225 mg bid, and due to today's EKG showing a first degree block of 346 ms, consideration for reducing dose to 150 mg bid   2. Chadsvasc score of at least 6 Recently taken off coumadin as recommended by GI due to recurrent GI bleeds Discussed watchman device with pt and daughter Discussed with Dr. [name] who feels pt  will not be an appropriate candidate as guidelines say that to be able to get an Watchman , he would have to take warfarin before implant with an INR of 2-3 and then afterwards 81 mg asa +warfarin for 45 days and then asa +plavix for 6 months.  Pt does not want to go back on coumadin at all and this would put him at risk for further GI bleeding  Pt will f/u with Dr. [name] as scheduled  [name]. [name], [place]  [facility] [address] [place], [place]  [address] [phone]

## 2016-12-23 ENCOUNTER — Encounter (HOSPITAL_COMMUNITY): Payer: Self-pay

## 2016-12-23 ENCOUNTER — Emergency Department (HOSPITAL_COMMUNITY)
Admission: EM | Admit: 2016-12-23 | Discharge: 2016-12-23 | Disposition: A | Discharge status: Home / Self Care | Payer: Medicare Other | Attending: Emergency Medicine | Admitting: Emergency Medicine

## 2016-12-23 DIAGNOSIS — E119 Type 2 diabetes mellitus without complications: Secondary | ICD-10-CM | POA: Diagnosis not present

## 2016-12-23 DIAGNOSIS — I5032 Chronic diastolic (congestive) heart failure: Secondary | ICD-10-CM | POA: Diagnosis not present

## 2016-12-23 DIAGNOSIS — F17210 Nicotine dependence, cigarettes, uncomplicated: Secondary | ICD-10-CM | POA: Insufficient documentation

## 2016-12-23 DIAGNOSIS — R5383 Other fatigue: Secondary | ICD-10-CM | POA: Insufficient documentation

## 2016-12-23 DIAGNOSIS — I2510 Atherosclerotic heart disease of native coronary artery without angina pectoris: Secondary | ICD-10-CM | POA: Insufficient documentation

## 2016-12-23 DIAGNOSIS — I110 Hypertensive heart disease with heart failure: Secondary | ICD-10-CM | POA: Diagnosis not present

## 2016-12-23 DIAGNOSIS — K5900 Constipation, unspecified: Secondary | ICD-10-CM | POA: Insufficient documentation

## 2016-12-23 DIAGNOSIS — Z79899 Other long term (current) drug therapy: Secondary | ICD-10-CM | POA: Diagnosis not present

## 2016-12-23 DIAGNOSIS — Z794 Long term (current) use of insulin: Secondary | ICD-10-CM | POA: Insufficient documentation

## 2016-12-23 DIAGNOSIS — Z7902 Long term (current) use of antithrombotics/antiplatelets: Secondary | ICD-10-CM | POA: Insufficient documentation

## 2016-12-23 DIAGNOSIS — J449 Chronic obstructive pulmonary disease, unspecified: Secondary | ICD-10-CM | POA: Insufficient documentation

## 2016-12-23 LAB — COMPREHENSIVE METABOLIC PANEL
ALBUMIN: 3.6 g/dL (ref 3.5–5.0)
ALK PHOS: 85 U/L (ref 38–126)
ALT: 49 U/L (ref 17–63)
ANION GAP: 9 (ref 5–15)
AST: 41 U/L (ref 15–41)
BILIRUBIN TOTAL: 0.1 mg/dL — AB (ref 0.3–1.2)
BUN: 16 mg/dL (ref 6–20)
CHLORIDE: 105 mmol/L (ref 101–111)
CO2: 24 mmol/L (ref 22–32)
Calcium: 8.9 mg/dL (ref 8.9–10.3)
Creatinine, Ser: 0.89 mg/dL (ref 0.61–1.24)
Glucose, Bld: 170 mg/dL — ABNORMAL HIGH (ref 65–99)
POTASSIUM: 4.3 mmol/L (ref 3.5–5.1)
Sodium: 138 mmol/L (ref 135–145)
Total Protein: 6.7 g/dL (ref 6.5–8.1)

## 2016-12-23 LAB — CBC WITH DIFFERENTIAL/PLATELET
BASOS PCT: 0 %
Basophils Absolute: 0 10*3/uL (ref 0.0–0.1)
EOS ABS: 0.1 10*3/uL (ref 0.0–0.7)
Eosinophils Relative: 1 %
HEMATOCRIT: 37.3 % — AB (ref 39.0–52.0)
HEMOGLOBIN: 11.7 g/dL — AB (ref 13.0–17.0)
LYMPHS ABS: 0.7 10*3/uL (ref 0.7–4.0)
Lymphocytes Relative: 11 %
MCH: 27.9 pg (ref 26.0–34.0)
MCHC: 31.4 g/dL (ref 30.0–36.0)
MCV: 89 fL (ref 78.0–100.0)
MONO ABS: 0.6 10*3/uL (ref 0.1–1.0)
MONOS PCT: 9 %
NEUTROS ABS: 5.4 10*3/uL (ref 1.7–7.7)
NEUTROS PCT: 79 %
Platelets: 261 10*3/uL (ref 150–400)
RBC: 4.19 MIL/uL — ABNORMAL LOW (ref 4.22–5.81)
RDW: 22 % — AB (ref 11.5–15.5)
WBC: 6.8 10*3/uL (ref 4.0–10.5)

## 2016-12-23 LAB — URINALYSIS, ROUTINE W REFLEX MICROSCOPIC
Bilirubin Urine: NEGATIVE
GLUCOSE, UA: NEGATIVE mg/dL
HGB URINE DIPSTICK: NEGATIVE
KETONES UR: NEGATIVE mg/dL
Leukocytes, UA: NEGATIVE
Nitrite: NEGATIVE
PROTEIN: NEGATIVE mg/dL
Specific Gravity, Urine: 1.009 (ref 1.005–1.030)
pH: 6 (ref 5.0–8.0)

## 2016-12-23 LAB — PROTIME-INR
INR: 0.89
Prothrombin Time: 12 seconds (ref 11.4–15.2)

## 2016-12-23 LAB — TYPE AND SCREEN
ABO/RH(D): A NEG
ANTIBODY SCREEN: NEGATIVE

## 2016-12-23 LAB — I-STAT TROPONIN, ED: TROPONIN I, POC: 0 ng/mL (ref 0.00–0.08)

## 2016-12-23 NOTE — ED Notes (Signed)
Pt stuck twice for blood draw and on second attempt pt jerked and writer was unable to collet type and screen.

## 2016-12-23 NOTE — ED Notes (Signed)
Pt did not come when called for blood draw. Will retry in 15 mins.

## 2016-12-23 NOTE — ED Triage Notes (Addendum)
Pt c/o weakness and anemia since he woke up this morning. He has a hx of needing blood and iron transfusions. A&Ox4. Ambulatory. Denies pain. No unilateral weakness or facial droop/numbness.

## 2016-12-23 NOTE — ED Provider Notes (Signed)
[facility] DEPT Provider Note   CSN: [id number] Arrival date & time: 12/23/16  1437     History   Chief Complaint Chief Complaint  Patient presents with  . Fatigue    HPI [name] is a 76 y.o. male.  HPI   Feeling weak this morning, feeling fatigued. When first woke up felt like forehead was hot but that resolved.  Yesterday was feeling well. No fevers, no chest pain, no shortness of breath, no palpitations, no numbness or weakness on one side or another. Had carpal tunnel surgery in left wrist and chronic numbness there.   Eating well, watching diet. Has been taking medication and insulin as prescribed. Started on new kidney medicine but not sure what it is  Sees Dr. Marin Olp, but not until 10/19  NO lightheadedness, syncope  Past Medical History:  Diagnosis Date  . Anemia    takes Ferrous Sulfate daily  . Arthritis    "all over"  . Balance problem 01/2014  . CAP (community acquired pneumonia) 09/18/2014  . Cervical radiculopathy due to degenerative joint disease of spine   . COPD (chronic obstructive pulmonary disease) ([place])   . Coronary artery disease, non-occlusive    a. 03/2010 Nonocclusive disease by cath, performed for ST elevations on ECG;  b. 06/2013 Lexi MV: EF 60%, no ischemia.  . Diabetes mellitus type II    takes Metformin and Lantus daily  . Diastolic CHF, chronic ([place])    a. 12/2012 EF 55-60%, diast dysfxn, triv MR, mildly dil LA/RA.  [name] History of blood transfusion 1982   "when I had stomach OR"  . History of bronchitis    1998  . History of gastric ulcer   . HTN (hypertension)    takes Diltiazem daily  . Hyperlipidemia    takes Pravastatin daily  . Joint pain   . PAF (paroxysmal atrial fibrillation) (H[facility])    Recurrent after atrial flutter (a. 07/2010 Status post caval tricuspid isthmus ablation by Dr. Midge Aver Metoprolol daily), currently controlled on flecainide plus diltiazem and Coumadin  . Weakness    numbness and tingling both  hands    Patient Active Problem List   Diagnosis Date Noted  . Acute blood loss anemia 10/02/2016  . Abdominal pain 10/02/2016  . Constipation 10/02/2016  . Left lower quadrant pain   . Coagulopathy ([place])   . Heme positive stool   . Symptomatic anemia 07/10/2016  . Syncope 05/15/2015  . Cervical disc disease 05/15/2015  . Abnormal urinalysis 05/15/2015  . Anemia 05/15/2015  . History of CVA (cerebrovascular accident) 04/03/2014  . Near syncope 02/15/2014  . Pre-syncope 02/15/2014  . Benign neoplasm of rectum and anal canal 12/02/2013  . Upper GI bleeding 11/30/2013  . Tobacco use disorder 11/30/2013  . PAF (paroxysmal atrial fibrillation), maintaining SR; CHA2DS2Vasc = 7 08/15/2013  . Anticoagulation goal of INR 2 to 3, for PAF - CHA2DS2Vasc = 7 08/15/2013  . Type 2 diabetes mellitus ([place]) 01/22/2013  . Tobacco abuse counseling 12/15/2012  . Hyperlipidemia   . Obesity (BMI 30-39.9) 12/14/2012  . Atrial flutter - status post CTI ablation 07/29/2010  . Essential hypertension 07/29/2010  . Chronic diastolic congestive heart failure ([place]) 07/29/2010  . Coronary artery disease, non-occlusive 07/29/2010    Past Surgical History:  Procedure Laterality Date  . ATRIAL ABLATION SURGERY  08/05/10   CTI ablation for atrial flutter by JA  . CARDIAC CATHETERIZATION  2012   nl LV function, no occlusive CAD, PAF  . CARDIOVERSION  12/07/2010  Successful direct current cardioversion with atrial fibrillation to normal sinus rhythm  . CARPAL TUNNEL RELEASE Bilateral 01/30/2014   Procedure: BILATERAL CARPAL TUNNEL RELEASE;  Surgeon: Marianna Payment, MD;  Location: [facility];  Service: Orthopedics;  Laterality: Bilateral;  . CATARACT EXTRACTION W/ INTRAOCULAR LENS  IMPLANT, BILATERAL Bilateral   . COLONOSCOPY [name] 12/02/2013   Procedure: COLONOSCOPY;  Surgeon: Irene Shipper, MD;  Location: [facility];  Service: Endoscopy;  Laterality: [name];  . ESOPHAGOGASTRODUODENOSCOPY [name] 09/22/2014    Procedure: ESOPHAGOGASTRODUODENOSCOPY (EGD);  Surgeon: Ronald Lobo, MD;  Location: [facility] ENDOSCOPY;  Service: Endoscopy;  Laterality: [name];  . ESOPHAGOGASTRODUODENOSCOPY [name] 07/11/2016   Procedure: ESOPHAGOGASTRODUODENOSCOPY (EGD);  Surgeon: Doran Stabler, MD;  Location: [facility] ENDOSCOPY;  Service: Endoscopy;  Laterality: [name];  . INCISION AND DRAINAGE ABSCESS / HEMATOMA OF BURSA / KNEE / THIGH Left 1998   knee  . KNEE BURSECTOMY Left 1998  . LAPAROSCOPIC CHOLECYSTECTOMY  03/2010  . NM MYOVIEW LTD  07/22/2013   Normal EF ~60%, no ischemia or infarction.  [name] PARTIAL GASTRECTOMY  1982   subtotal; "took out 30% for ulcers"  . TRANSTHORACIC ECHOCARDIOGRAM  02/16/2014   EF 60%, no RWMA. - otherwise normal  . YAG LASER APPLICATION Bilateral        Home Medications    Prior to Admission medications   Medication Sig Start Date End Date Taking? Authorizing Provider  acetaminophen (TYLENOL) 650 MG CR tablet Take 650 mg by mouth 2 (two) times daily as needed for pain.    [provider]  co-enzyme Q-10 30 MG capsule Take 30 mg by mouth 3 (three) times daily.    [provider]  diltiazem (CARDIZEM CD) 120 MG 24 hr capsule Take 1 capsule (120 mg total) by mouth daily. 10/25/16   Strader, Fransisco Hertz, PA-C  ferrous sulfate 325 (65 FE) MG EC tablet Take 325 mg by mouth daily with breakfast.    [provider]  flecainide (TAMBOCOR) 150 MG tablet Take 1.5 tablets (225 mg total) by mouth 2 (two) times daily. 10/25/16   Strader, Fransisco Hertz, PA-C  HYDROcodone-acetaminophen (NORCO/VICODIN) 5-325 MG tablet Take 1 tablet by mouth 2 (two) times daily as needed for moderate pain.    [provider]  Insulin Glargine (LANTUS) 100 UNIT/ML Solostar Pen Inject 8 Units into the skin 2 (two) times daily. 11/30/16   Domenic Polite, MD  loratadine (CLARITIN) 10 MG tablet Take 10 mg by mouth every evening.     [provider]  metFORMIN (GLUCOPHAGE) 1000 MG tablet Take 1,000 mg by  mouth 2 (two) times daily with a meal.     [provider]  metoprolol succinate (TOPROL-XL) 25 MG 24 hr tablet TAKE 1/2 TABLET  (12.5 MG TOTAL) BY MOUTH DAILY. 10/25/16   Strader, Fransisco Hertz, PA-C  Multiple Vitamin (MULTIVITAMIN WITH MINERALS) TABS tablet Take 1 tablet by mouth daily.     [provider]  nitroGLYCERIN (NITROSTAT) 0.4 MG SL tablet Place 1 tablet (0.4 mg total) under the tongue every 5 (five) minutes x 3 doses as needed for chest pain. 11/19/15   Erlene Quan, PA-C  oxybutynin (DITROPAN-XL) 5 MG 24 hr tablet Take 5 mg by mouth daily. 11/02/16 11/02/17  [provider]  pantoprazole (PROTONIX) 40 MG tablet Take 1 tablet (40 mg total) by mouth 2 (two) times daily before a meal. 11/30/16   Domenic Polite, MD  polyethylene glycol [facility] / Floria Raveling) packet Take 17 g by mouth 2 (two) times  daily. Patient taking differently: Take 17 g by mouth daily as needed for moderate constipation.  10/03/16   Sheikh, Omair Latif, DO  pravastatin (PRAVACHOL) 40 MG tablet TAKE 1 TABLET EVERY EVENING Patient taking differently: TAKE 40 MG BY MOUTH EVERY EVENING 09/22/16   Leonie Man, MD  senna-docusate (SENOKOT-S) 8.6-50 MG tablet Take 1 tablet by mouth 2 (two) times daily. Patient taking differently: Take 1 tablet by mouth 2 (two) times daily as needed for mild constipation.  10/03/16   Raiford Noble Latif, DO  tamsulosin (FLOMAX) 0.4 MG CAPS capsule Take 0.4 mg by mouth daily.  10/01/16   [provider]    Family History Family History  Problem Relation Age of Onset  . Cancer Mother   . Heart attack Father   . Heart attack Brother     Social History Social History  Substance Use Topics  . Smoking status: Current Every Day Smoker    Packs/day: 0.50    Years: 55.00    Types: Cigarettes  . Smokeless tobacco: Never Used     Comment: He's been smoking between 0.5 and 2 ppd since age 52.  [name] Alcohol use No     Comment: "quit drinking in 1986"      Allergies   Flexeril [cyclobenzaprine]   Review of Systems Review of Systems  Constitutional: Positive for fatigue. Negative for fever.  HENT: Negative for sore throat.   Eyes: Negative for visual disturbance.  Respiratory: Negative for shortness of breath.   Cardiovascular: Negative for chest pain.  Gastrointestinal: Positive for constipation. Negative for abdominal pain, diarrhea, nausea and vomiting.  Genitourinary: Negative for difficulty urinating and dysuria.  Musculoskeletal: Negative for back pain and neck stiffness.  Skin: Negative for rash.  Neurological: Negative for syncope and headaches.     Physical Exam Updated Vital Signs BP 131/86   Pulse 80   Temp 98.4 F (36.9 C) (Oral)   Resp 18   SpO2 98%   Physical Exam  Constitutional: He is oriented to person, place, and time. He appears well-developed and well-nourished. No distress.  HENT:  Head: Normocephalic and atraumatic.  Eyes: Conjunctivae and EOM are normal.  Neck: Normal range of motion.  Cardiovascular: Normal rate, regular rhythm, normal heart sounds and intact distal pulses.  Exam reveals no gallop and no friction rub.   No murmur heard. Pulmonary/Chest: Effort normal and breath sounds normal. No respiratory distress. He has no wheezes. He has no rales.  Abdominal: Soft. He exhibits no distension. There is no tenderness. There is no guarding.  Musculoskeletal: He exhibits no edema.  Neurological: He is alert and oriented to person, place, and time.  Skin: Skin is warm and dry. He is not diaphoretic.  Nursing note and vitals reviewed.    ED Treatments / Results  Labs (all labs ordered are listed, but only abnormal results are displayed) Labs Reviewed  CBC WITH DIFFERENTIAL/PLATELET - Abnormal; Notable for the following:       Result Value   RBC 4.19 (*)    Hemoglobin 11.7 (*)    HCT 37.3 (*)    RDW 22.0 (*)    All other components within normal limits  COMPREHENSIVE METABOLIC PANEL -  Abnormal; Notable for the following:    Glucose, Bld 170 (*)    Total Bilirubin 0.1 (*)    All other components within normal limits  URINALYSIS, ROUTINE W REFLEX MICROSCOPIC - Abnormal; Notable for the following:    Color, Urine STRAW (*)  All other components within normal limits  PROTIME-INR  I-STAT TROPONIN, ED  TYPE AND SCREEN    EKG  EKG Interpretation  Date/Time:  Saturday December 23 2016 21:54:45 EDT Ventricular Rate:  61 PR Interval:    QRS Duration: 93 QT Interval:  424 QTC Calculation: 428 R Axis:   -43 Text Interpretation:  Sinus or ectopic atrial rhythm Prolonged PR interval Left axis deviation Minimal ST elevation, anterior leads No significant change since last tracing Confirmed by Gareth Morgan [phone]) on 12/23/2016 10:47:36 PM       Radiology No results found.  Procedures Procedures (including critical care time)  Medications Ordered in ED Medications - No data to display   Initial Impression / Assessment and Plan / ED Course  I have reviewed the triage vital signs and the nursing notes.  Pertinent labs & imaging results that were available during my care of the patient were reviewed by me and considered in my medical decision making (see chart for details).     76 year old male with a history of paroxysmal atrial fibrillation now off of Coumadin given history of recent GI bleed with hemoglobin of 6.9 past, on flecainide, hypertension, hyperlipidemia, status post Billroth II and anastomotic ulcer, diastolic heart failure, diabetes, COPD, presents with concern for fatigue.  EKG without acute changes. Troponin within normal limits. Urinalysis without signs of infection. Electrolytes are within normal limits. CBC shows hemoglobin increasing from prior, now up to 11.2. He does not have any other infectious symptoms, no chest pain, no shortness of breath, no cough, no fevers, no bloody stool. NO focal neuro symptoms. Unclear cause of fatigue at this time,  however recommend outpatient follow-up. He was started on a medication as an outpatient "for his kidneys" but is unclear what medication he is discussion--recommend following up with PCP to determine if this is also contributing to fatigue. Patient discharged in stable condition with understanding of reasons to return.   Final Clinical Impressions(s) / ED Diagnoses   Final diagnoses:  Other fatigue    New Prescriptions Discharge Medication List as of 12/23/2016 10:59 PM       Gareth Morgan, MD 12/24/16 [id number]

## 2016-12-25 ENCOUNTER — Encounter: Payer: Self-pay | Admitting: *Deleted

## 2016-12-25 NOTE — Progress Notes (Signed)
[place] Clinical Social Work  Holiday representative contacted patient's daughter to offer support and discuss transportation concerns.  Patients daughter stated she is usually available to provide transportation to all of her fathers appointments.  Patient needed transportation assistance after his last appointment due to a last minute schedule change.  Patients daughter stated she schedules his appointments around her work schedule so she will be available.  CSW informed patients daughter of transportation assistance opportunities in the community.  Patients daughter knows to contact CSW with needs regarding transportation.  Johnnye Lana, MSW, LCSW, OSW-C Clinical Social Worker [facility] [phone]

## 2016-12-29 IMAGING — CT CT HEAD W/O CM
2 of 3 series · 14 of 47 positions shown, 17 images · non-contrast
Comparison: Brain MRI 05/16/2015.  Cervical spine CT 10/09/2014

CLINICAL DATA: Complains of a headache at the vertex. Posterior
lateral neck pain on both sides for 3-4 months. No injury.

EXAM:
CT HEAD WITHOUT CONTRAST
CT CERVICAL SPINE WITHOUT CONTRAST
TECHNIQUE: Multidetector CT imaging of the head and cervical spine was
performed following the standard protocol without intravenous
contrast. Multiplanar CT image reconstructions of the cervical spine
were also generated.

[Series 3: head 5.0 h30s · axial · 0.49mm/px · z∈[-116,+19]mm · 11 of 33 slices shown, 14 images]
[im 3/33  brain]
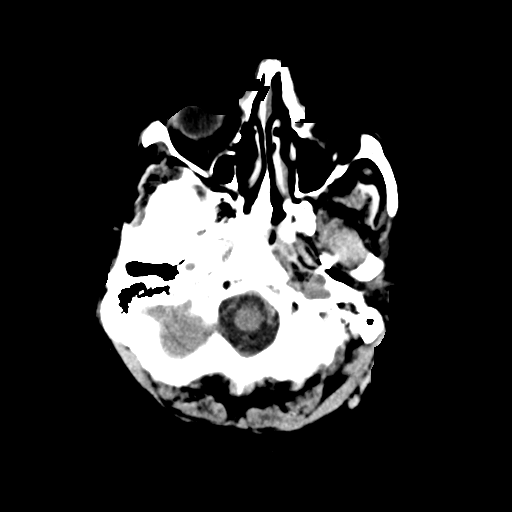
[im 3/33  bone]
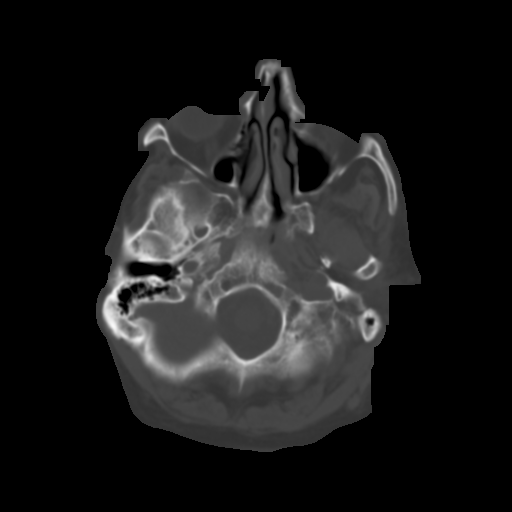
[im 5/33  brain]
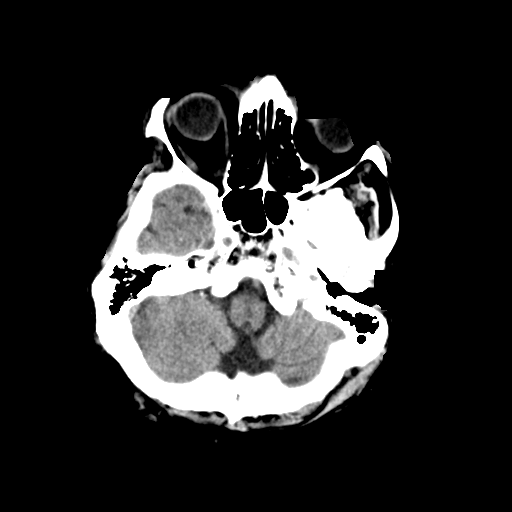
[im 8/33  brain]
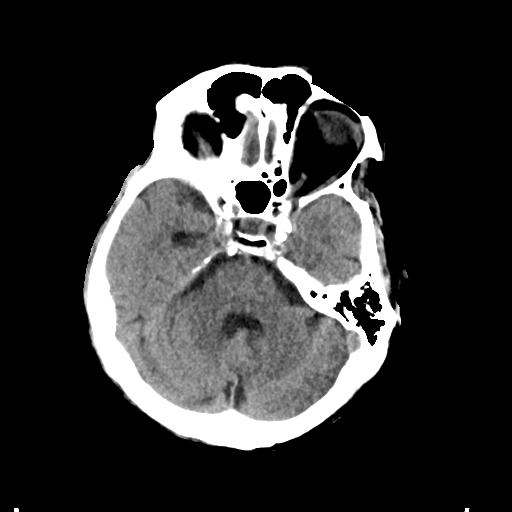
[im 10/33  brain]
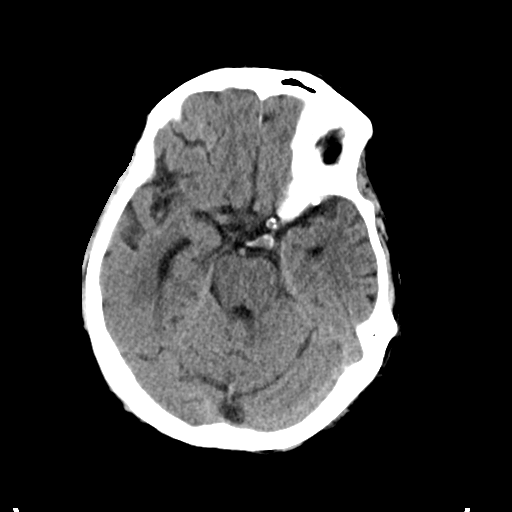
[im 14/33  brain]
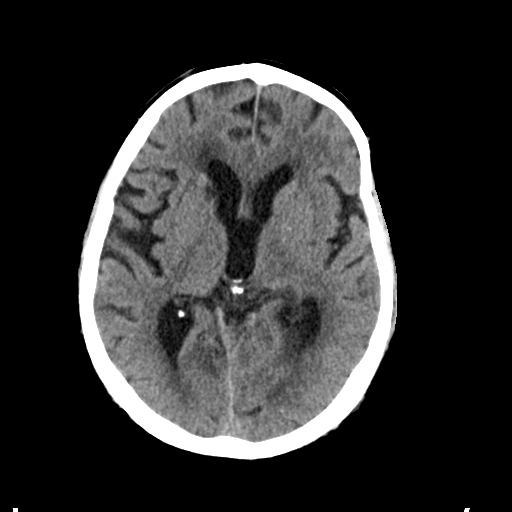
[im 14/33  bone]
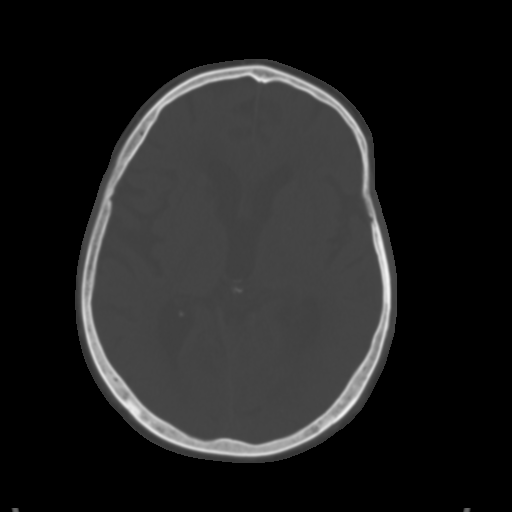
[im 17/33  brain]
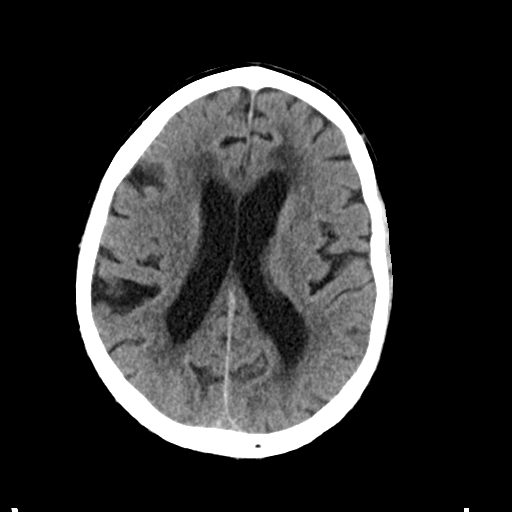
[im 19/33  brain]
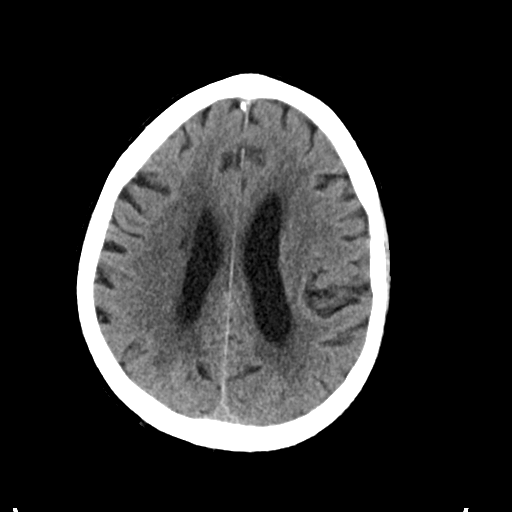
[im 23/33  brain]
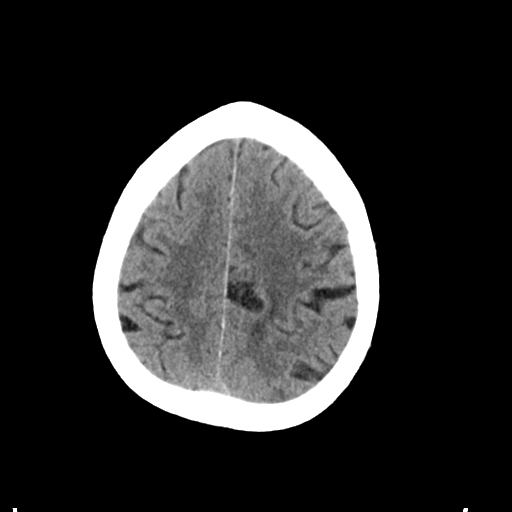
[im 25/33  brain]
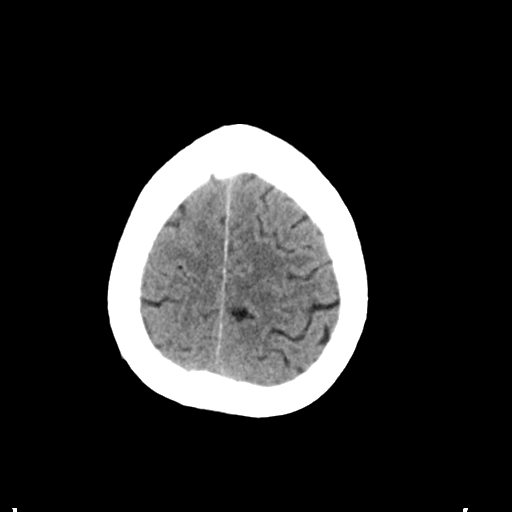
[im 25/33  bone]
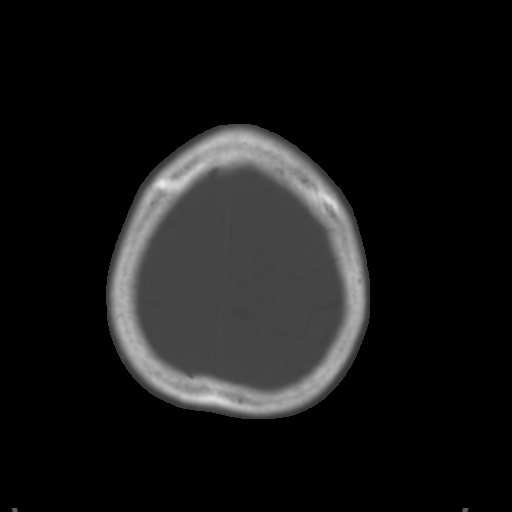
[im 28/33  brain]
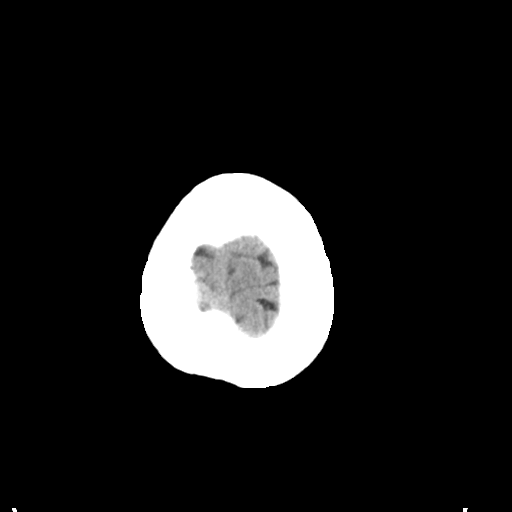
[im 30/33  brain]
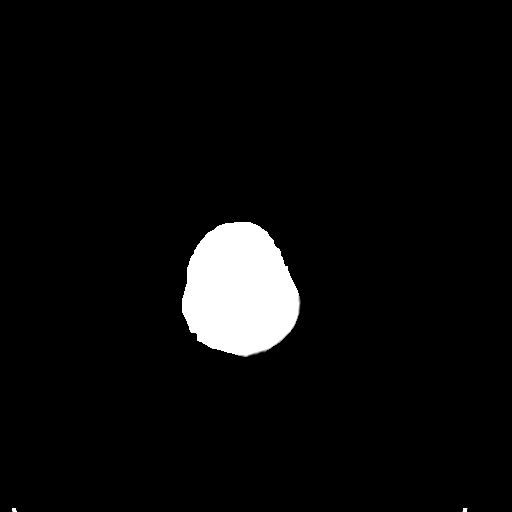

[Series 5: head 3.0 mpr · coronal · 0.32mm/px · 3 of 67 slices shown]
[im 23/67  brain]
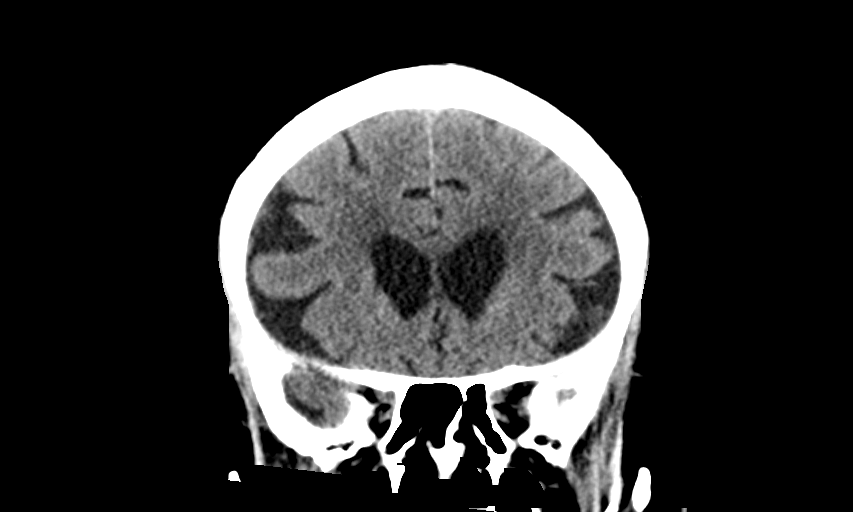
[im 30/67  brain]
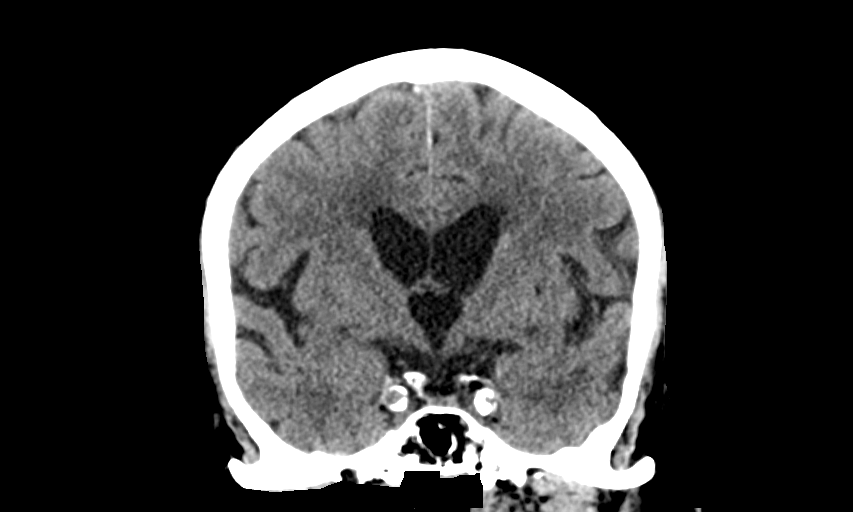
[im 37/67  brain]
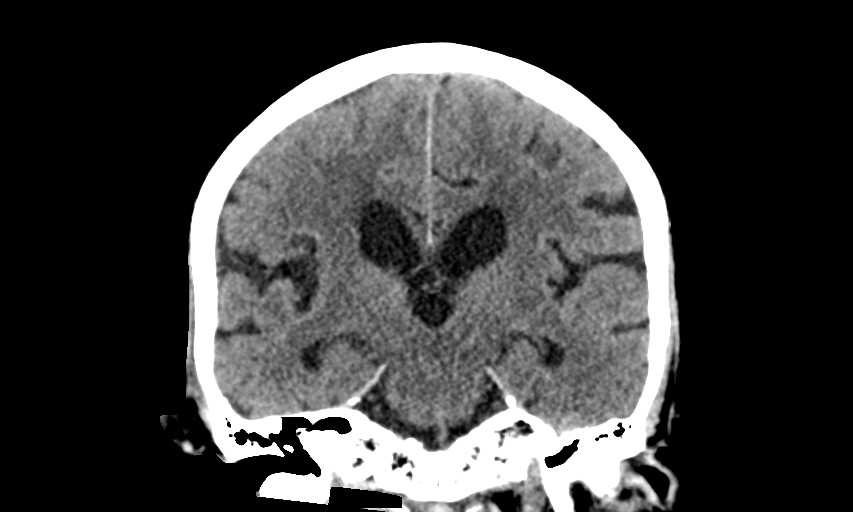

[14 of 47 positions shown; findings below may reference images not displayed]

FINDINGS: CT HEAD FINDINGS

Diffuse low density throughout the white matter is suggestive for
chronic changes and minimally changed from the previous examination.
There is an old lacune infarct involving the right caudate head.
Cerebral atrophy is unchanged. No evidence for acute hemorrhage,
mass lesion, midline shift, hydrocephalus or large infarct.
Visualized paranasal sinuses and mastoid air appearance cells are
clear. No calvarial fracture.

CT CERVICAL SPINE FINDINGS

The lung apices are clear. Alignment of the cervical spine is within
normal limits with multilevel disc space narrowing from C4 through
T1. There is bilateral facet arthropathy. There is increased bone
destruction or erosions along the left posterior aspect of the dens.
In addition, there is increased cortical destruction involving the
left posterior aspect of the C1 body. There appears to be a soft
tissue pannus formation involving these erosive changes. There does
not appear to be significantly increased mass effect in the spinal
canal related to these changes at C1-C2 but this could be better
characterized with MRI. Again noted is central canal narrowing in
cervical spine better characterized on the previous MRI. No
significant soft tissue swelling in the paravertebral soft tissues.
No evidence for a fracture.
IMPRESSION: No acute intracranial abnormality.

Stable white matter changes suggestive for chronic small vessel
ischemic disease. Evidence of an old lacune infarct involving the
right caudate head.

Progression of erosive changes with presumed pannus formation
involving the left side of the dens and left side of C1. These
findings are concerning for an underlying inflammatory arthropathy.

Cervical spondylosis.

## 2016-12-29 IMAGING — CT CT HEAD W/O CM
3 of 4 series · 15 of 47 positions shown, 18 images · non-contrast
Comparison: Brain MRI 05/16/2015.  Cervical spine CT 10/09/2014

CLINICAL DATA: Complains of a headache at the vertex. Posterior
lateral neck pain on both sides for 3-4 months. No injury.

EXAM:
CT HEAD WITHOUT CONTRAST
CT CERVICAL SPINE WITHOUT CONTRAST
TECHNIQUE: Multidetector CT imaging of the head and cervical spine was
performed following the standard protocol without intravenous
contrast. Multiplanar CT image reconstructions of the cervical spine
were also generated.

[Series 602: sagittal · sagittal · 0.51mm/px · 3 of 51 slices shown]
[im 17/51  brain]
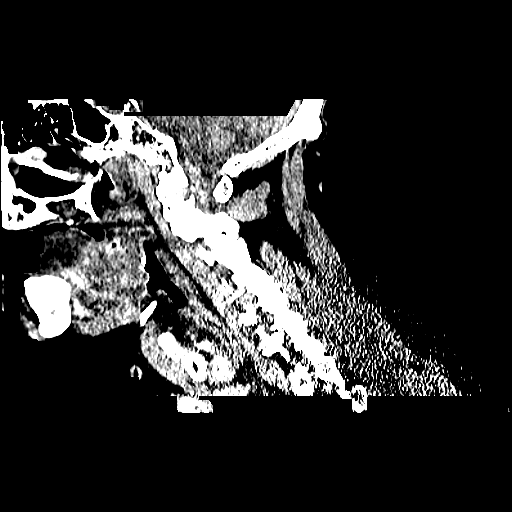
[im 26/51  brain]
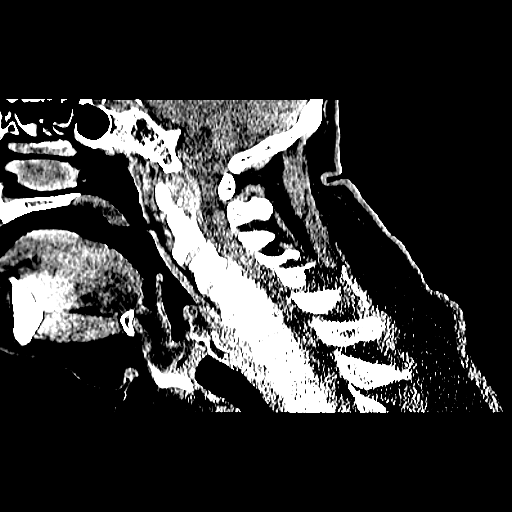
[im 34/51  brain]
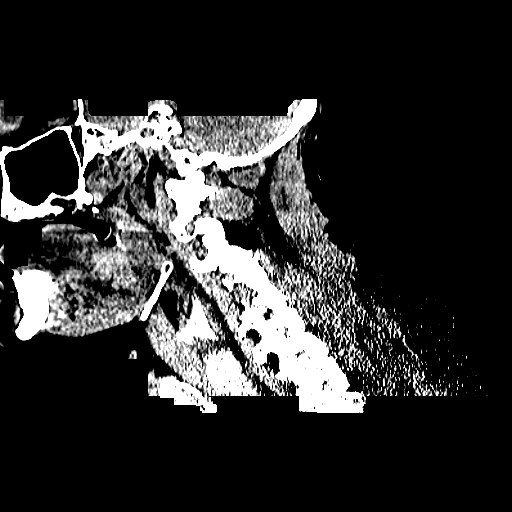

[Series 603: coronal · coronal · 0.51mm/px · 3 of 51 slices shown]
[im 17/51  brain]
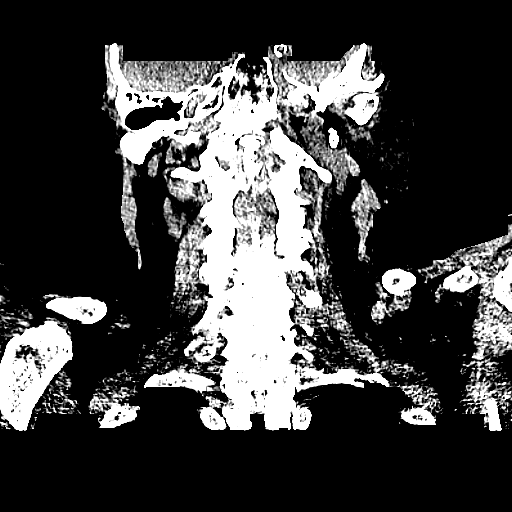
[im 23/51  brain]
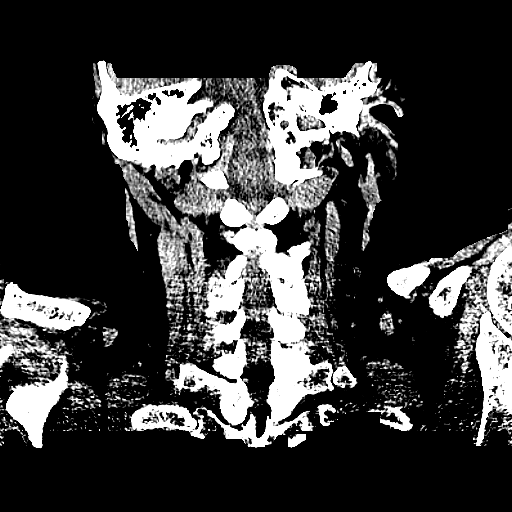
[im 28/51  brain]
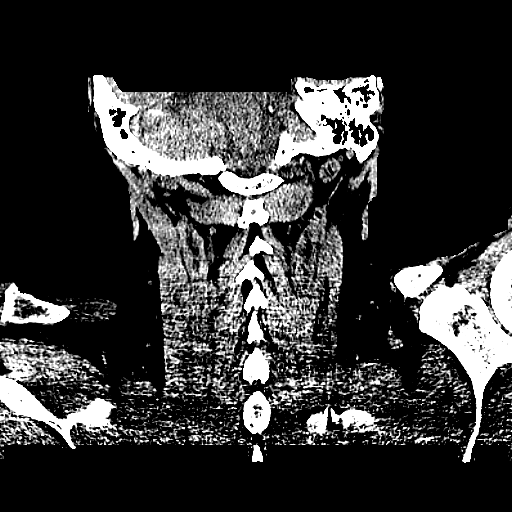

[Series 604: orthogonal · axial · 0.51mm/px · z∈[-308,-194]mm · 9 of 80 slices shown, 12 images]
[im 6/80  brain]
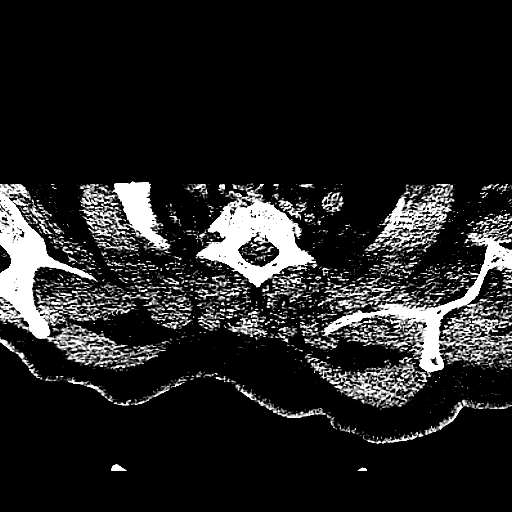
[im 6/80  bone]
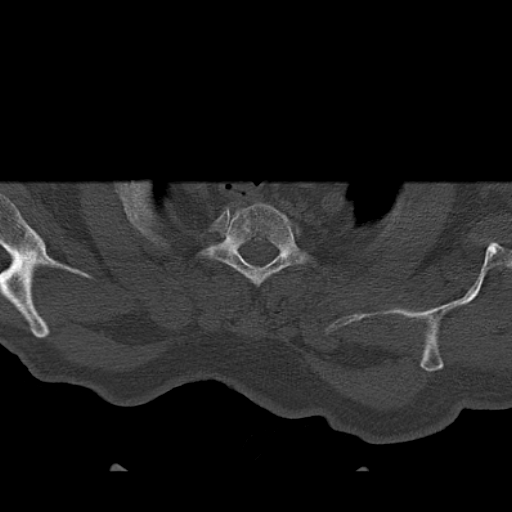
[im 17/80  brain]
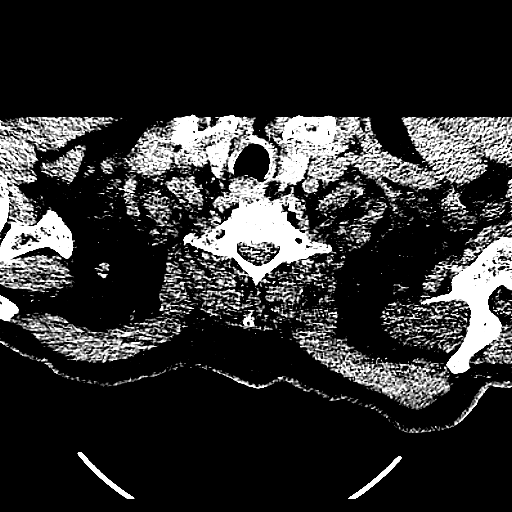
[im 23/80  brain]
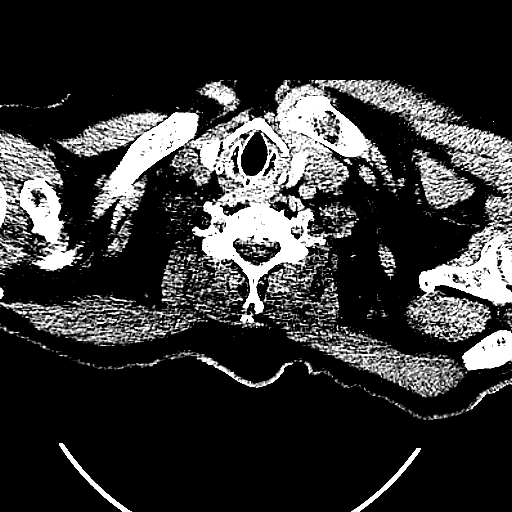
[im 34/80  brain]
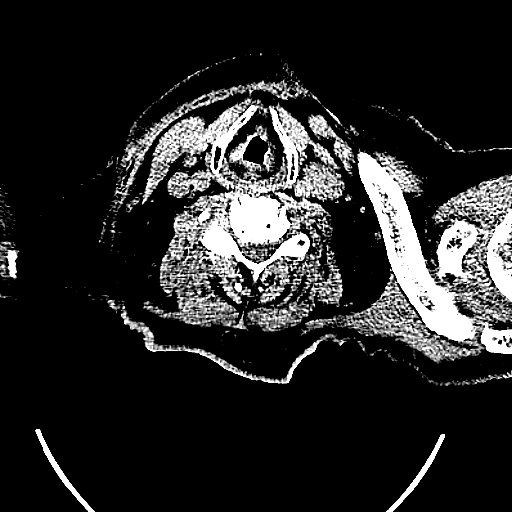
[im 40/80  brain]
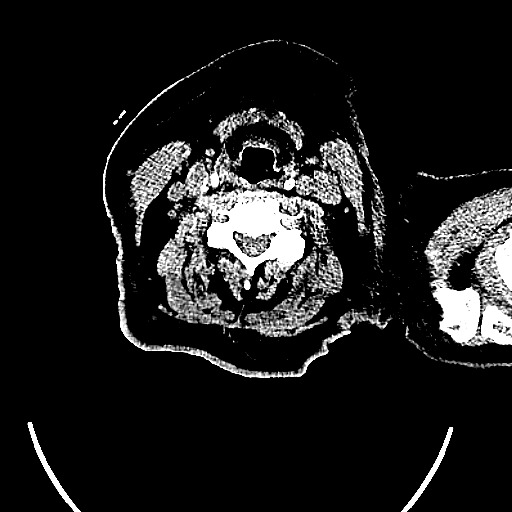
[im 40/80  bone]
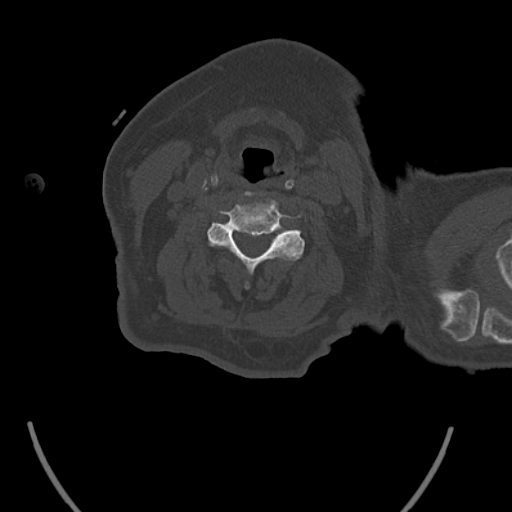
[im 46/80  brain]
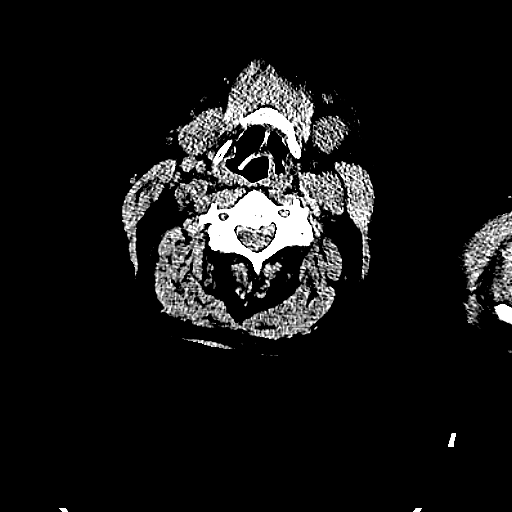
[im 57/80  brain]
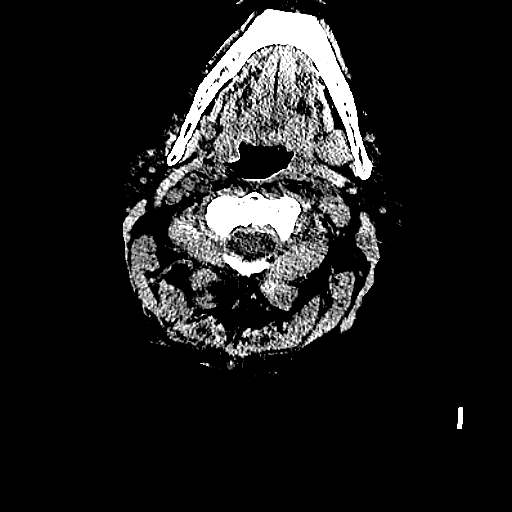
[im 63/80  brain]
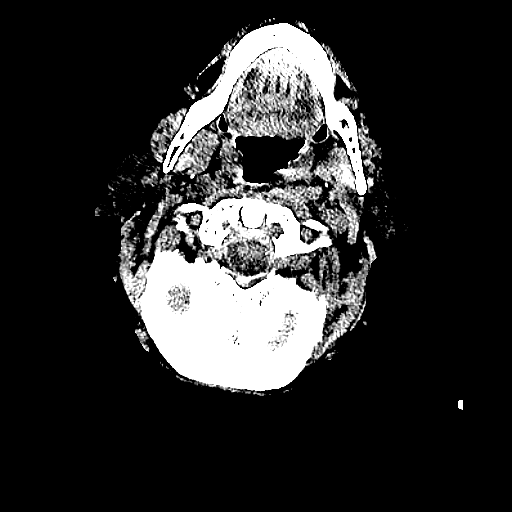
[im 74/80  brain]
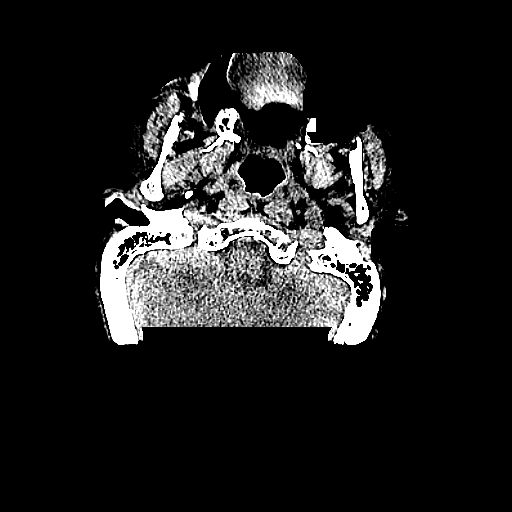
[im 74/80  bone]
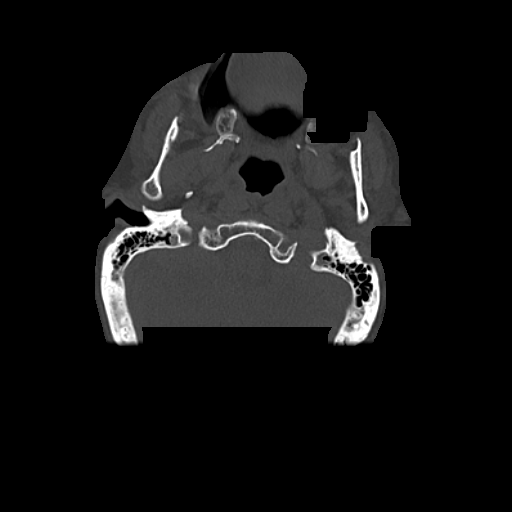

[15 of 47 positions shown; findings below may reference images not displayed]

FINDINGS: CT HEAD FINDINGS

Diffuse low density throughout the white matter is suggestive for
chronic changes and minimally changed from the previous examination.
There is an old lacune infarct involving the right caudate head.
Cerebral atrophy is unchanged. No evidence for acute hemorrhage,
mass lesion, midline shift, hydrocephalus or large infarct.
Visualized paranasal sinuses and mastoid air appearance cells are
clear. No calvarial fracture.

CT CERVICAL SPINE FINDINGS

The lung apices are clear. Alignment of the cervical spine is within
normal limits with multilevel disc space narrowing from C4 through
T1. There is bilateral facet arthropathy. There is increased bone
destruction or erosions along the left posterior aspect of the dens.
In addition, there is increased cortical destruction involving the
left posterior aspect of the C1 body. There appears to be a soft
tissue pannus formation involving these erosive changes. There does
not appear to be significantly increased mass effect in the spinal
canal related to these changes at C1-C2 but this could be better
characterized with MRI. Again noted is central canal narrowing in
cervical spine better characterized on the previous MRI. No
significant soft tissue swelling in the paravertebral soft tissues.
No evidence for a fracture.
IMPRESSION: No acute intracranial abnormality.

Stable white matter changes suggestive for chronic small vessel
ischemic disease. Evidence of an old lacune infarct involving the
right caudate head.

Progression of erosive changes with presumed pannus formation
involving the left side of the dens and left side of C1. These
findings are concerning for an underlying inflammatory arthropathy.

Cervical spondylosis.

## 2017-01-02 IMAGING — CT CT HEAD W/O CM
3 of 6 series · 15 of 47 positions shown, 18 images · non-contrast
Comparison: 10/19/2015

CLINICAL DATA: Unsteady gait

EXAM:
CT HEAD WITHOUT CONTRAST
TECHNIQUE: Contiguous axial images were obtained from the base of the skull
through the vertex without intravenous contrast.

[Series 2: head 5.0 h30s · axial · 0.44mm/px · z∈[+527,+662]mm · 9 of 35 slices shown, 12 images]
[im 4/35  brain]
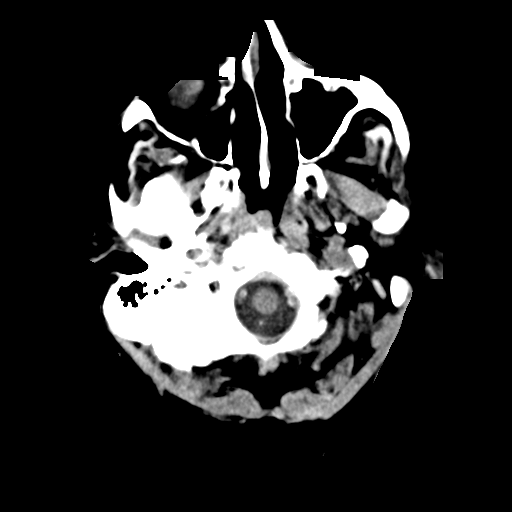
[im 4/35  bone]
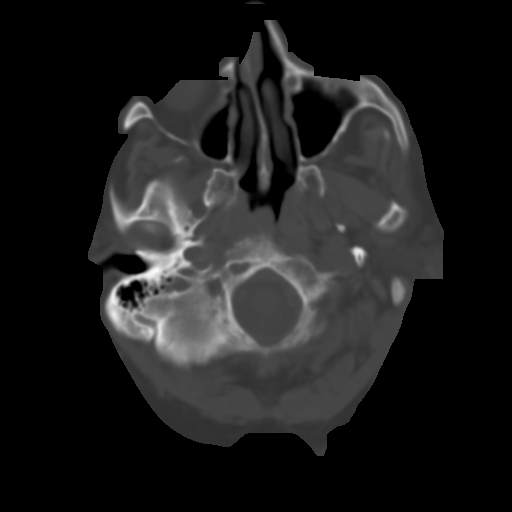
[im 7/35  brain]
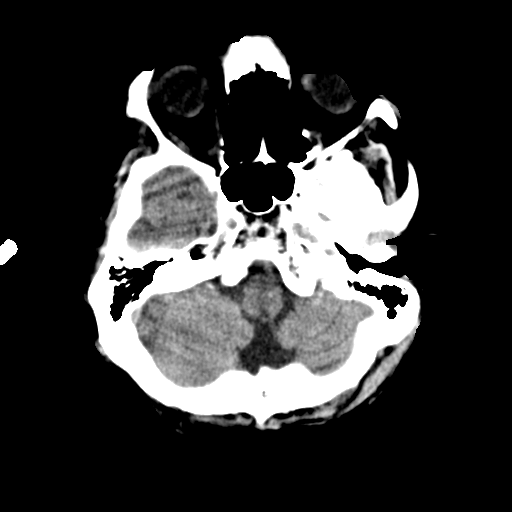
[im 10/35  brain]
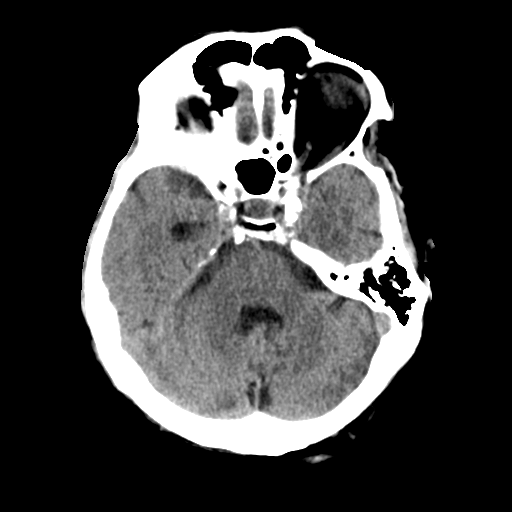
[im 13/35  brain]
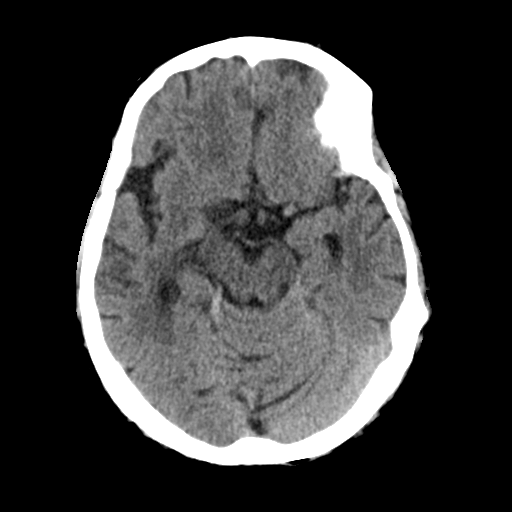
[im 18/35  brain]
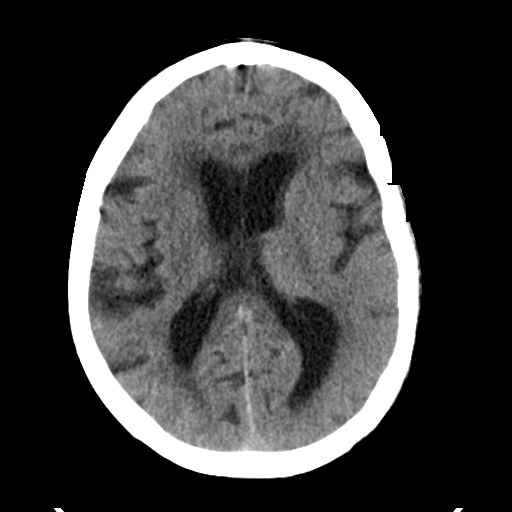
[im 18/35  bone]
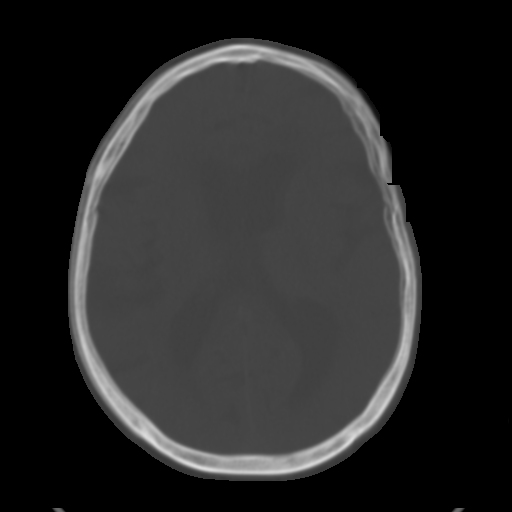
[im 22/35  brain]
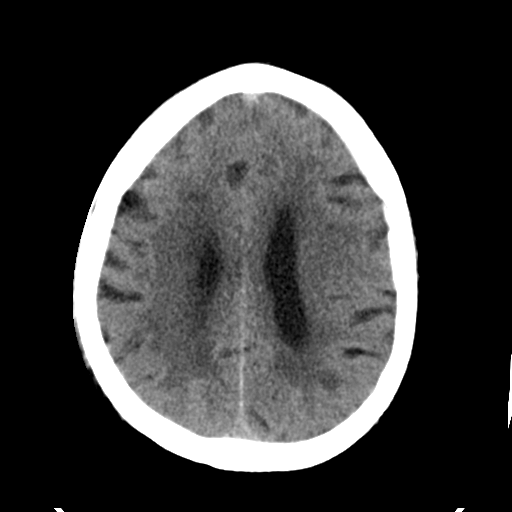
[im 25/35  brain]
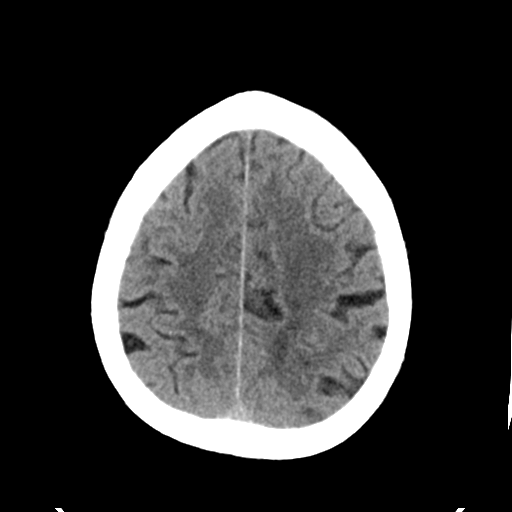
[im 28/35  brain]
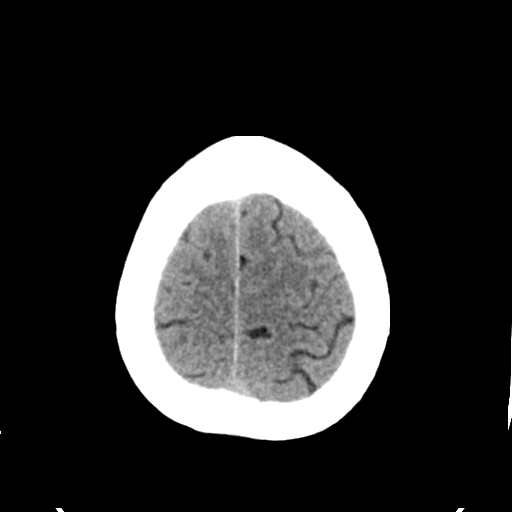
[im 31/35  brain]
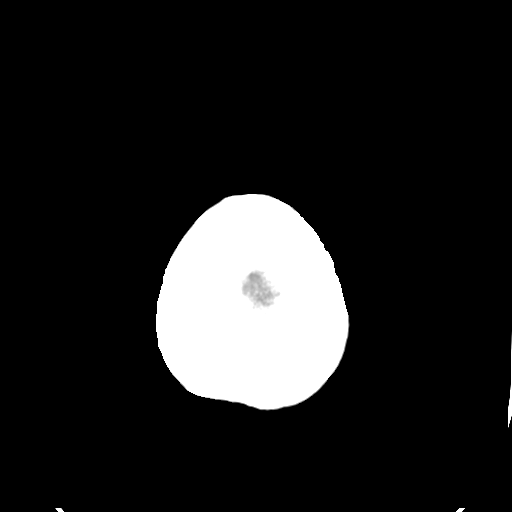
[im 31/35  bone]
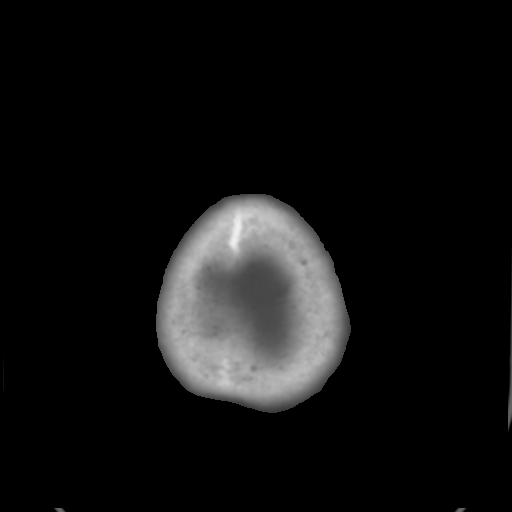

[Series 6: head 3.0 mpr · coronal · 0.34mm/px · 3 of 70 slices shown (1 of 2)]
[im 14/70  brain]
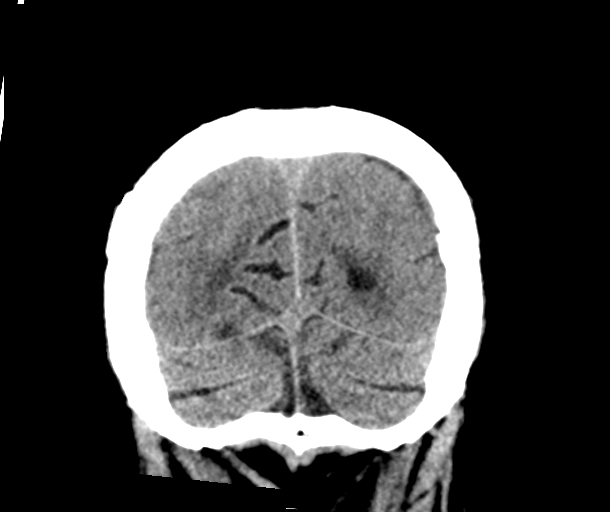
[im 28/70  brain]
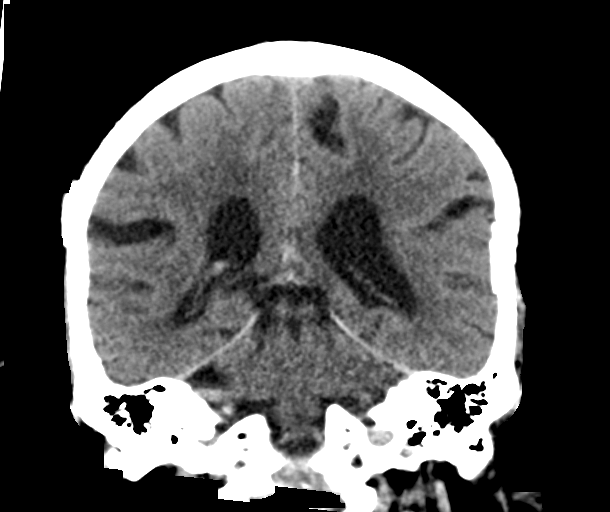
[im 41/70  brain]
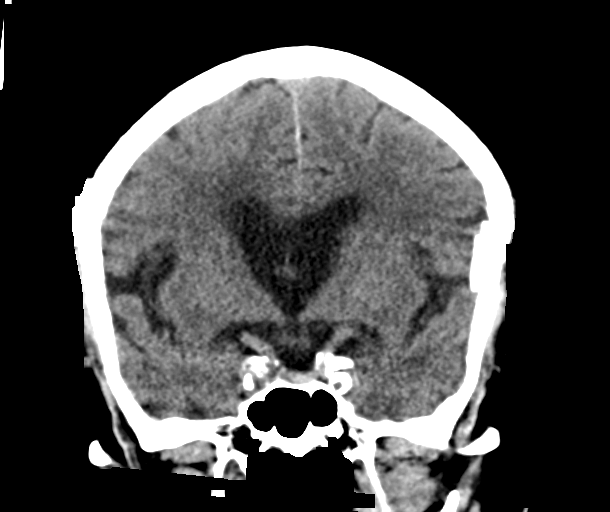

[Series 7: head 3.0 mpr · sagittal · 0.34mm/px · 3 of 55 slices shown (2 of 2)]
[im 6/55  brain]
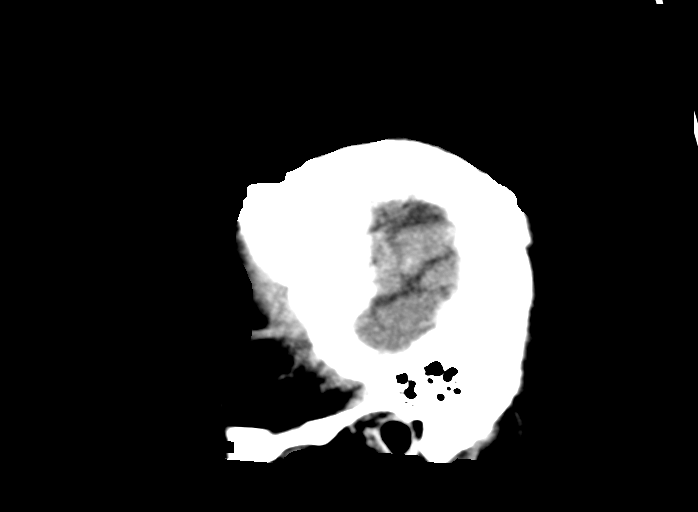
[im 9/55  brain]
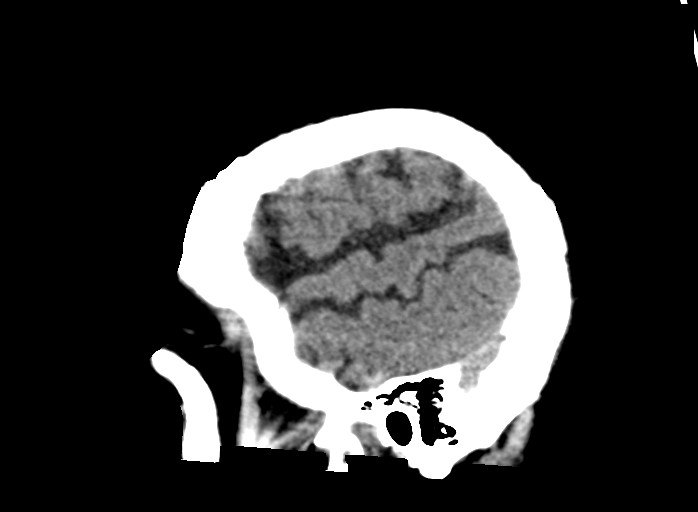
[im 12/55  brain]
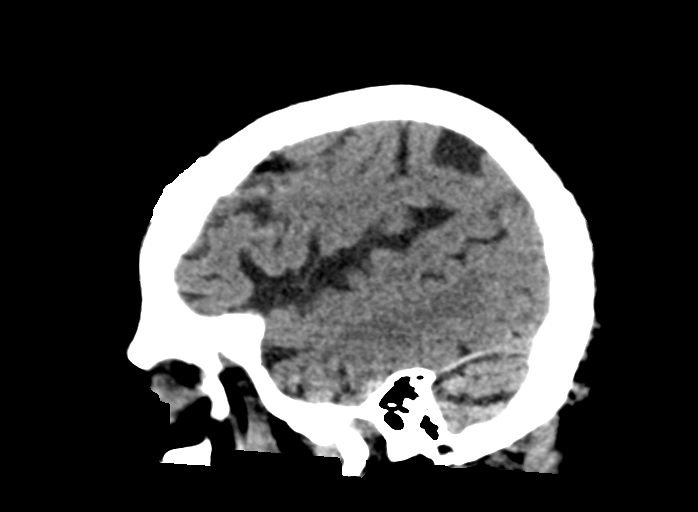

[15 of 47 positions shown; findings below may reference images not displayed]

FINDINGS: Atrophic changes and chronic white matter ischemic change is again
identified similar to that seen on the recent exam. No acute
hemorrhage or acute infarct is noted. No space-occupying mass lesion
is seen. Small lacunar infarct is noted in the deep white matter
adjacent to the right lateral ventricle stable from the prior study.
There again noted changes at C1 to articulation similar to that
noted on the prior exam with some rows sh changes consistent with
arthropathy.
IMPRESSION: Chronic atrophic and ischemic changes without acute abnormality.

Inflammatory changes at the C1-2 articulation. These are also stable
from the prior study.

## 2017-01-05 ENCOUNTER — Emergency Department (HOSPITAL_COMMUNITY)
Admission: EM | Admit: 2017-01-05 | Discharge: 2017-01-05 | Disposition: A | Discharge status: Home / Self Care | Payer: Medicare Other | Attending: Emergency Medicine | Admitting: Emergency Medicine

## 2017-01-05 ENCOUNTER — Encounter (HOSPITAL_COMMUNITY): Payer: Self-pay

## 2017-01-05 ENCOUNTER — Emergency Department (HOSPITAL_COMMUNITY): Payer: Medicare Other

## 2017-01-05 DIAGNOSIS — F17210 Nicotine dependence, cigarettes, uncomplicated: Secondary | ICD-10-CM | POA: Diagnosis not present

## 2017-01-05 DIAGNOSIS — Z794 Long term (current) use of insulin: Secondary | ICD-10-CM | POA: Insufficient documentation

## 2017-01-05 DIAGNOSIS — I5032 Chronic diastolic (congestive) heart failure: Secondary | ICD-10-CM | POA: Insufficient documentation

## 2017-01-05 DIAGNOSIS — Z79899 Other long term (current) drug therapy: Secondary | ICD-10-CM | POA: Insufficient documentation

## 2017-01-05 DIAGNOSIS — I110 Hypertensive heart disease with heart failure: Secondary | ICD-10-CM | POA: Insufficient documentation

## 2017-01-05 DIAGNOSIS — M25571 Pain in right ankle and joints of right foot: Secondary | ICD-10-CM | POA: Diagnosis not present

## 2017-01-05 DIAGNOSIS — I2510 Atherosclerotic heart disease of native coronary artery without angina pectoris: Secondary | ICD-10-CM | POA: Diagnosis not present

## 2017-01-05 DIAGNOSIS — E119 Type 2 diabetes mellitus without complications: Secondary | ICD-10-CM | POA: Insufficient documentation

## 2017-01-05 NOTE — ED Notes (Signed)
Pt went to turn around and felt his ankle pop/snap, "something pulled apart"

## 2017-01-05 NOTE — ED Provider Notes (Signed)
[facility] DEPT Provider Note   CSN: [id number] Arrival date & time: 01/05/17  1435     History   Chief Complaint Chief Complaint  Patient presents with  . Ankle Pain    HPI [name] is a 76 y.o. male who presents emergency department today for right ankle pain 2 weeks. Patient notes that when he was at home walking, he turned and felt a popping/snapping sensation on the lateral aspect of the ankle. He has had continued pain on the lateral aspect of the ankle, distal to the lateral malleolus, that is worse with rom and ambulation. Walks at baseline with a cane for back problems. Says the cane helps with weight bearing. Today while turning around again, he felt a pulling apart sensation in the ankle, with an increase in the pain. The patient has baseline numbness/tingiling from diabetic neuropathy. No change. Has been taking hydrocodone with mild relief. Says he just wants to make sure it is not broken. No fever, chills, erythema, joint swelling, decreased rom, or other new arthralgias.    HPI  Past Medical History:  Diagnosis Date  . Anemia    takes Ferrous Sulfate daily  . Arthritis    "all over"  . Balance problem 01/2014  . CAP (community acquired pneumonia) 09/18/2014  . Cervical radiculopathy due to degenerative joint disease of spine   . COPD (chronic obstructive pulmonary disease) ([place])   . Coronary artery disease, non-occlusive    a. 03/2010 Nonocclusive disease by cath, performed for ST elevations on ECG;  b. 06/2013 Lexi MV: EF 60%, no ischemia.  . Diabetes mellitus type II    takes Metformin and Lantus daily  . Diastolic CHF, chronic ([place])    a. 12/2012 EF 55-60%, diast dysfxn, triv MR, mildly dil LA/RA.  [name] History of blood transfusion 1982   "when I had stomach OR"  . History of bronchitis    1998  . History of gastric ulcer   . HTN (hypertension)    takes Diltiazem daily  . Hyperlipidemia    takes Pravastatin daily  . Joint pain   . PAF  (paroxysmal atrial fibrillation) (HCC)    Recurrent after atrial flutter (a. 07/2010 Status post caval tricuspid isthmus ablation by Dr. Midge Aver Metoprolol daily), currently controlled on flecainide plus diltiazem and Coumadin  . Weakness    numbness and tingling both hands    Patient Active Problem List   Diagnosis Date Noted  . Acute blood loss anemia 10/02/2016  . Abdominal pain 10/02/2016  . Constipation 10/02/2016  . Left lower quadrant pain   . Coagulopathy ([place])   . Heme positive stool   . Symptomatic anemia 07/10/2016  . Syncope 05/15/2015  . Cervical disc disease 05/15/2015  . Abnormal urinalysis 05/15/2015  . Anemia 05/15/2015  . History of CVA (cerebrovascular accident) 04/03/2014  . Near syncope 02/15/2014  . Pre-syncope 02/15/2014  . Benign neoplasm of rectum and anal canal 12/02/2013  . Upper GI bleeding 11/30/2013  . Tobacco use disorder 11/30/2013  . PAF (paroxysmal atrial fibrillation), maintaining SR; CHA2DS2Vasc = 7 08/15/2013  . Anticoagulation goal of INR 2 to 3, for PAF - CHA2DS2Vasc = 7 08/15/2013  . Type 2 diabetes mellitus ([place]) 01/22/2013  . Tobacco abuse counseling 12/15/2012  . Hyperlipidemia   . Obesity (BMI 30-39.9) 12/14/2012  . Atrial flutter - status post CTI ablation 07/29/2010  . Essential hypertension 07/29/2010  . Chronic diastolic congestive heart failure ([place]) 07/29/2010  . Coronary artery disease, non-occlusive 07/29/2010  Past Surgical History:  Procedure Laterality Date  . ATRIAL ABLATION SURGERY  08/05/10   CTI ablation for atrial flutter by JA  . CARDIAC CATHETERIZATION  2012   nl LV function, no occlusive CAD, PAF  . CARDIOVERSION  12/07/2010    Successful direct current cardioversion with atrial fibrillation to normal sinus rhythm  . CARPAL TUNNEL RELEASE Bilateral 01/30/2014   Procedure: BILATERAL CARPAL TUNNEL RELEASE;  Surgeon: Marianna Payment, MD;  Location: [facility];  Service: Orthopedics;  Laterality: Bilateral;  .  CATARACT EXTRACTION W/ INTRAOCULAR LENS  IMPLANT, BILATERAL Bilateral   . COLONOSCOPY [name] 12/02/2013   Procedure: COLONOSCOPY;  Surgeon: Irene Shipper, MD;  Location: [facility];  Service: Endoscopy;  Laterality: [name];  . ESOPHAGOGASTRODUODENOSCOPY [name] 09/22/2014   Procedure: ESOPHAGOGASTRODUODENOSCOPY (EGD);  Surgeon: Ronald Lobo, MD;  Location[facility] ENDOSCOPY;  Service: Endoscopy;  Laterality: [name];  . ESOPHAGOGASTRODUODENOSCOPY [name] 07/11/2016   Procedure: ESOPHAGOGASTRODUODENOSCOPY (EGD);  Surgeon: Doran Stabler, MD;  Location: [facility] ENDOSCOPY;  Service: Endoscopy;  Laterality: [name];  . INCISION AND DRAINAGE ABSCESS / HEMATOMA OF BURSA / KNEE / THIGH Left 1998   knee  . KNEE BURSECTOMY Left 1998  . LAPAROSCOPIC CHOLECYSTECTOMY  03/2010  . NM MYOVIEW LTD  07/22/2013   Normal EF ~60%, no ischemia or infarction.  [name] PARTIAL GASTRECTOMY  1982   subtotal; "took out 30% for ulcers"  . TRANSTHORACIC ECHOCARDIOGRAM  02/16/2014   EF 60%, no RWMA. - otherwise normal  . YAG LASER APPLICATION Bilateral        Home Medications    Prior to Admission medications   Medication Sig Start Date End Date Taking? Authorizing Provider  acetaminophen (TYLENOL) 650 MG CR tablet Take 650 mg by mouth 2 (two) times daily as needed for pain.   Yes [provider]  co-enzyme Q-10 30 MG capsule Take 30 mg by mouth 3 (three) times daily.   Yes [provider]  diltiazem (CARDIZEM CD) 120 MG 24 hr capsule Take 1 capsule (120 mg total) by mouth daily. 10/25/16  Yes Strader, Fransisco Hertz, PA-C  ferrous sulfate 325 (65 FE) MG EC tablet Take 325 mg by mouth daily with breakfast.   Yes [provider]  flecainide (TAMBOCOR) 150 MG tablet Take 1.5 tablets (225 mg total) by mouth 2 (two) times daily. 10/25/16  Yes Strader, Fransisco Hertz, PA-C  HYDROcodone-acetaminophen (NORCO/VICODIN) 5-325 MG tablet Take 1 tablet by mouth 2 (two) times daily as needed for moderate pain.   Yes [provider]    Insulin Glargine (LANTUS) 100 UNIT/ML Solostar Pen Inject 8 Units into the skin 2 (two) times daily. 11/30/16  Yes Domenic Polite, MD  loratadine (CLARITIN) 10 MG tablet Take 10 mg by mouth every evening.    Yes [provider]  metFORMIN (GLUCOPHAGE) 1000 MG tablet Take 1,000 mg by mouth 2 (two) times daily with a meal.    Yes [provider]  metoprolol succinate (TOPROL-XL) 25 MG 24 hr tablet TAKE 1/2 TABLET  (12.5 MG TOTAL) BY MOUTH DAILY. 10/25/16  Yes Strader, [place] M, PA-C  Multiple Vitamin (MULTIVITAMIN WITH MINERALS) TABS tablet Take 1 tablet by mouth daily.    Yes [provider]  nitroGLYCERIN (NITROSTAT) 0.4 MG SL tablet Place 1 tablet (0.4 mg total) under the tongue every 5 (five) minutes x 3 doses as needed for chest pain. 11/19/15  Yes Kilroy, Luke K, PA-C  pantoprazole (PROTONIX) 40 MG tablet Take 1 tablet (40 mg total) by mouth 2 (  two) times daily before a meal. 11/30/16  Yes Domenic Polite, MD  polyethylene glycol [facility] / GLYCOLAX) packet Take 17 g by mouth 2 (two) times daily. Patient taking differently: Take 17 g by mouth daily as needed for moderate constipation.  10/03/16  Yes Sheikh, [place], DO  pravastatin (PRAVACHOL) 40 MG tablet TAKE 1 TABLET EVERY EVENING Patient taking differently: TAKE 40 MG BY MOUTH EVERY EVENING 09/22/16  Yes Leonie Man, MD  tamsulosin (FLOMAX) 0.4 MG CAPS capsule Take 0.4 mg by mouth daily.  10/01/16  Yes [provider]  senna-docusate (SENOKOT-S) 8.6-50 MG tablet Take 1 tablet by mouth 2 (two) times daily. Patient not taking: Reported on 01/05/2017 10/03/16   Kerney Elbe, DO    Family History Family History  Problem Relation Age of Onset  . Cancer Mother   . Heart attack Father   . Heart attack Brother     Social History Social History  Substance Use Topics  . Smoking status: Current Every Day Smoker    Packs/day: 0.50    Years: 55.00    Types: Cigarettes  . Smokeless tobacco: Never  Used     Comment: He's been smoking between 0.5 and 2 ppd since age 46.  [name] Alcohol use No     Comment: "quit drinking in 1986"     Allergies   Flexeril [cyclobenzaprine]   Review of Systems Review of Systems  Constitutional: Negative for chills and fever.  Musculoskeletal: Positive for arthralgias and gait problem.  Skin: Negative for color change and wound.  Neurological: Negative for weakness.     Physical Exam Updated Vital Signs BP (!) 141/84 (BP Location: Left Arm)   Pulse 77   Temp 97.8 F (36.6 C) (Oral)   Resp 18   Ht 5\' 6"  (1.676 m)   Wt 77.1 kg (170 lb)   SpO2 96%   BMI 27.44 kg/m   Physical Exam  Constitutional: He appears well-developed and well-nourished.  HENT:  Head: Normocephalic and atraumatic.  Right Ear: External ear normal.  Left Ear: External ear normal.  Eyes: Conjunctivae are normal. Right eye exhibits no discharge. Left eye exhibits no discharge. No scleral icterus.  Cardiovascular:  Pulses:      Dorsalis pedis pulses are 2+ on the right side, and 2+ on the left side.       Posterior tibial pulses are 2+ on the right side, and 2+ on the left side.  Pulmonary/Chest: Effort normal. No respiratory distress.  Musculoskeletal:       Right ankle: He exhibits normal range of motion, no swelling, no ecchymosis, no deformity and normal pulse. Tenderness. Achilles tendon exhibits no pain, no defect and normal Thompson's test results.       Feet:  Neurovascularly intact distally. Compartments soft above and below affected joint.   Feet:  Right Foot:  Skin Integrity: Negative for ulcer, blister, skin breakdown, erythema or warmth.  Left Foot:  Skin Integrity: Negative for ulcer, blister, skin breakdown, erythema or warmth.  Neurological: He is alert. He has normal strength.  Patient able to bear weight and ambulate but states it is painful   Skin: No pallor.  Psychiatric: He has a normal mood and affect.  Nursing note and vitals  reviewed.    ED Treatments / Results  Labs (all labs ordered are listed, but only abnormal results are displayed) Labs Reviewed - No data to display  EKG  EKG Interpretation None       Radiology Dg  Ankle Complete Right  Result Date: 01/05/2017 CLINICAL DATA:  Nontraumatic right ankle pain for 2 weeks, worse today. EXAM: RIGHT ANKLE - COMPLETE 3+ VIEW COMPARISON:  None. FINDINGS: Negative for acute fracture or dislocation. Moderate degenerate irregularities are present at the tibiotalar articulation. There is irregularity of the talar neck but this does not have the appearance of a 75-week-old fracture. It may reflect residua from remote trauma. IMPRESSION: Talar neck irregularity which could represent residua from remote trauma but this does not have the appearance of a recent fracture. There also are moderate tibiotalar degenerative changes. If additional imaging is clinically warranted, CT would provide better evaluation of the talar abnormality. Electronically Signed   By: Andreas Newport M.D.   On: 01/05/2017 19:12    Procedures Procedures (including critical care time)  Medications Ordered in ED Medications - No data to display   Initial Impression / Assessment and Plan / ED Course  I have reviewed the triage vital signs and the nursing notes.  Pertinent labs & imaging results that were available during my care of the patient were reviewed by me and considered in my medical decision making (see chart for details).     Patient with 2 week history of ankle pain after turning and hearing popping/snapping sensation. X-Ray negative for obvious fracture or dislocation. There is degenerative changes. Do not feel CT is waranted at this time. Patient is afebrile, and the joint is without swelling, heat, erythema or decreased ROM. Do not suspect septic joint. Will place patient in cam walker and have follow up with orthopedics if symptoms persist for possibility of missed fracture  diagnosis. Conservative therapy recommended and discussed. Patient will be dc home & is agreeable with above plan. Patient case seen and discussed with Dr. Vanita Panda who is in agreement with plan.    Final Clinical Impressions(s) / ED Diagnoses   Final diagnoses:  Acute right ankle pain    New Prescriptions New Prescriptions   No medications on file     Lorelle Gibbs 01/05/17 2118    Carmin Muskrat, MD 01/06/17 0001

## 2017-01-05 NOTE — ED Triage Notes (Signed)
Patient c/o right ankle pain x 2 weeks. Patient states his right ankle has been "snapping"(clicking) x 2 weeks and today the pain was so bad that he could not bear weight.

## 2017-01-05 NOTE — Discharge Instructions (Signed)
Please read and follow all provided instructions.  You have been seen today for right ankle pain  Tests performed today include: An x-ray of the affected area - does NOT show any broken bones or dislocations. It did show degenerative changes, likely due to arthritis.  Vital signs. See below for your results today.   Home care instructions: -- *PRICE in the first 24-48 hours after injury: Protect (with brace, splint, sling), if given by your provider Rest Ice- Do not apply ice pack directly to your skin, place towel or similar between your skin and ice/ice pack. Apply ice for 20 min, then remove for 40 min while awake Compression- Wear brace, elastic bandage, splint as directed by your provider Elevate affected extremity above the level of your heart when not walking around for the first 24-48 hours   Continue pain medication advised by your PCP for pain.   Follow-up instructions: Please follow-up with your primary care provider or the provided orthopedic physician (bone specialist) if you continue to have significant pain in 1 week. In this case you may have a more severe injury that requires further care.   Return instructions:  Please return if your toes or feet are numb or tingling, appear gray or blue, or you have severe pain (also elevate the leg and loosen splint or wrap if you were given one) Please return to the Emergency Department if you experience worsening symptoms.  Please return if you have any other emergent concerns. Additional Information:  Your vital signs today were: BP (!) 141/84 (BP Location: Left Arm)    Pulse 77    Temp 97.8 F (36.6 C) (Oral)    Resp 18    Ht 5\' 6"  (1.676 m)    Wt 77.1 kg (170 lb)    SpO2 96%    BMI 27.44 kg/m  If your blood pressure (BP) was elevated above 135/85 this visit, please have this repeated by your doctor within one month. ---------------

## 2017-01-12 ENCOUNTER — Other Ambulatory Visit (HOSPITAL_BASED_OUTPATIENT_CLINIC_OR_DEPARTMENT_OTHER): Payer: Medicare Other

## 2017-01-12 ENCOUNTER — Ambulatory Visit (HOSPITAL_BASED_OUTPATIENT_CLINIC_OR_DEPARTMENT_OTHER): Payer: Medicare Other | Admitting: Family

## 2017-01-12 VITALS — BP 108/62 | HR 81 | Temp 98.0°F | Resp 20 | Wt 170.0 lb

## 2017-01-12 DIAGNOSIS — K922 Gastrointestinal hemorrhage, unspecified: Secondary | ICD-10-CM

## 2017-01-12 DIAGNOSIS — D500 Iron deficiency anemia secondary to blood loss (chronic): Secondary | ICD-10-CM | POA: Diagnosis not present

## 2017-01-12 LAB — CBC WITH DIFFERENTIAL (CANCER CENTER ONLY)
BASO#: 0 10*3/uL (ref 0.0–0.2)
BASO%: 0.6 % (ref 0.0–2.0)
EOS ABS: 0.1 10*3/uL (ref 0.0–0.5)
EOS%: 1.9 % (ref 0.0–7.0)
HCT: 37.6 % — ABNORMAL LOW (ref 38.7–49.9)
HGB: 11.8 g/dL — ABNORMAL LOW (ref 13.0–17.1)
LYMPH#: 0.9 10*3/uL (ref 0.9–3.3)
LYMPH%: 14.4 % (ref 14.0–48.0)
MCH: 28.9 pg (ref 28.0–33.4)
MCHC: 31.4 g/dL — AB (ref 32.0–35.9)
MCV: 92 fL (ref 82–98)
MONO#: 0.7 10*3/uL (ref 0.1–0.9)
MONO%: 10.8 % (ref 0.0–13.0)
NEUT#: 4.5 10*3/uL (ref 1.5–6.5)
NEUT%: 72.3 % (ref 40.0–80.0)
PLATELETS: 231 10*3/uL (ref 145–400)
RBC: 4.08 10*6/uL — ABNORMAL LOW (ref 4.20–5.70)
RDW: 19.2 % — AB (ref 11.1–15.7)
WBC: 6.3 10*3/uL (ref 4.0–10.0)

## 2017-01-12 LAB — IRON AND TIBC
%SAT: 23 % (ref 20–55)
Iron: 74 ug/dL (ref 42–163)
TIBC: 316 ug/dL (ref 202–409)
UIBC: 242 ug/dL (ref 117–376)

## 2017-01-12 LAB — FERRITIN: Ferritin: 28 ng/ml (ref 22–316)

## 2017-01-12 NOTE — Progress Notes (Signed)
Hematology and Oncology Follow Up Visit  [name] [id number] May 26, 1940 75 y.o. 01/12/2017   Principle Diagnosis:  Iron deficiency anemia secondary to intermittent GI bleed    Current Therapy:   IV iron as indicated Supportive transfusions as needed   Interim History:  [name] is here today with his daughter for follow-up. He states that he is feeling good and has no complaints at this time. He received IV iron in September and has responded nicely.  He denies any episodes of bleeding, bruising or petechiae.  No fever, chills, chewing ice, n/v, cough, rash, dizziness, SOB, chest pain, palpitations, abdominal pain or changes in bowel or bladder habits.  The neuropathy in his hands and feet is unchanged. Her has mild puffiness in his right ankle that comes and goes. No other swelling or tenderness present on exam.  He has maintained a good appetite and is staying well hydrated. His weight is stable.   ECOG Performance Status: 0 - Asymptomatic  Medications:  Allergies as of 01/12/2017      Reactions   Flexeril [cyclobenzaprine] Other (See Comments)   Makes it hard for him to stand      Medication List       Accurate as of 01/12/17 10:05 AM. Always use your most recent med list.          acetaminophen 650 MG CR tablet Commonly known as:  TYLENOL Take 650 mg by mouth 2 (two) times daily as needed for pain.   co-enzyme Q-10 30 MG capsule Take 30 mg by mouth 3 (three) times daily.   diltiazem 120 MG 24 hr capsule Commonly known as:  CARDIZEM CD Take 1 capsule (120 mg total) by mouth daily.   ferrous sulfate 325 (65 FE) MG EC tablet Take 325 mg by mouth daily with breakfast.   flecainide 150 MG tablet Commonly known as:  TAMBOCOR Take 1.5 tablets (225 mg total) by mouth 2 (two) times daily.   HYDROcodone-acetaminophen 5-325 MG tablet Commonly known as:  NORCO/VICODIN Take 1 tablet by mouth 2 (two) times daily as needed for moderate pain.   Insulin  Glargine 100 UNIT/ML Solostar Pen Commonly known as:  LANTUS Inject 8 Units into the skin 2 (two) times daily.   loratadine 10 MG tablet Commonly known as:  CLARITIN Take 10 mg by mouth every evening.   metFORMIN 1000 MG tablet Commonly known as:  GLUCOPHAGE Take 1,000 mg by mouth 2 (two) times daily with a meal.   metoprolol succinate 25 MG 24 hr tablet Commonly known as:  TOPROL-XL TAKE 1/2 TABLET  (12.5 MG TOTAL) BY MOUTH DAILY.   multivitamin with minerals Tabs tablet Take 1 tablet by mouth daily.   nitroGLYCERIN 0.4 MG SL tablet Commonly known as:  NITROSTAT Place 1 tablet (0.4 mg total) under the tongue every 5 (five) minutes x 3 doses as needed for chest pain.   pantoprazole 40 MG tablet Commonly known as:  PROTONIX Take 1 tablet (40 mg total) by mouth 2 (two) times daily before a meal.   polyethylene glycol packet Commonly known as:  MIRALAX / GLYCOLAX Take 17 g by mouth 2 (two) times daily.   pravastatin 40 MG tablet Commonly known as:  PRAVACHOL TAKE 1 TABLET EVERY EVENING   senna-docusate 8.6-50 MG tablet Commonly known as:  Senokot-S Take 1 tablet by mouth 2 (two) times daily.   tamsulosin 0.4 MG Caps capsule Commonly known as:  FLOMAX Take 0.4 mg by mouth daily.  Allergies:  Allergies  Allergen Reactions  . Flexeril [Cyclobenzaprine] Other (See Comments)    Makes it hard for him to stand    Past Medical History, Surgical history, Social history, and Family History were reviewed and updated.  Review of Systems: All other 10 point review of systems is negative.   Physical Exam:  vitals were not taken for this visit.  Wt Readings from Last 3 Encounters:  01/05/17 170 lb (77.1 kg)  12/22/16 170 lb 6.4 oz (77.3 kg)  12/08/16 169 lb (76.7 kg)    Ocular: Sclerae unicteric, pupils equal, round and reactive to light Ear-nose-throat: Oropharynx clear, dentition fair Lymphatic: No cervical, supraclavicular or axillary adenopathy Lungs no  rales or rhonchi, good excursion bilaterally Heart regular rate and rhythm, no murmur appreciated Abd soft, nontender, positive bowel sounds, no liver or spleen tip found on exam, no fluid wave MSK no focal spinal tenderness, no joint edema Neuro: non-focal, well-oriented, appropriate affect Breasts: Deferred   Lab Results  Component Value Date   WBC 6.3 01/12/2017   HGB 11.8 (L) 01/12/2017   HCT 37.6 (L) 01/12/2017   MCV 92 01/12/2017   PLT 231 01/12/2017   Lab Results  Component Value Date   FERRITIN 100 12/08/2016   IRON 46 12/08/2016   TIBC 364 12/08/2016   UIBC 318 12/08/2016   IRONPCTSAT 13 (L) 12/08/2016   Lab Results  Component Value Date   RETICCTPCT 3.7 (H) 05/15/2015   RBC 4.08 (L) 01/12/2017   No results found for: KPAFRELGTCHN, LAMBDASER, KAPLAMBRATIO No results found for: IGGSERUM, IGA, IGMSERUM No results found for: [name], SPEI   Chemistry      Component Value Date/Time   NA 138 12/23/2016 1551   NA 139 10/20/2016 1110   K 4.3 12/23/2016 1551   K 4.0 10/20/2016 1110   CL 105 12/23/2016 1551   CO2 24 12/23/2016 1551   CO2 26 10/20/2016 1110   BUN 16 12/23/2016 1551   BUN 15.2 10/20/2016 1110   CREATININE 0.89 12/23/2016 1551   CREATININE 0.8 10/20/2016 1110      Component Value Date/Time   CALCIUM 8.9 12/23/2016 1551   CALCIUM 9.6 10/20/2016 1110   ALKPHOS 85 12/23/2016 1551   ALKPHOS 85 10/20/2016 1110   AST 41 12/23/2016 1551   AST 33 10/20/2016 1110   ALT 49 12/23/2016 1551   ALT 42 10/20/2016 1110   BILITOT 0.1 (L) 12/23/2016 1551   BILITOT <0.22 10/20/2016 1110      Impression and Plan: [name] is a very pleasant 76 yo caucasian gentleman with anemia secondary to GI bleed on anticoagulation. He is now off of Coumadin and has not had any new episodes of bleeding. His Hgb is now up to 11.8 with an MCV of 92 and platelets are 231.  We will see what his iron studies show and  bring him back in next week if needed. We will plan to see him back in another 3 months for repeat lab work and follow-up.  He will contact our office with any questions or concerns. We can certainly see him sooner if need be.   Trevor Bottom, NP 10/19/201810:05 AM

## 2017-01-13 LAB — RETICULOCYTES: Reticulocyte Count: 2.2 % (ref 0.6–2.6)

## 2017-01-22 ENCOUNTER — Emergency Department (HOSPITAL_COMMUNITY)
Admission: EM | Admit: 2017-01-22 | Discharge: 2017-01-23 | Disposition: A | Discharge status: Home / Self Care | Payer: Medicare Other | Attending: Emergency Medicine | Admitting: Emergency Medicine

## 2017-01-22 ENCOUNTER — Encounter (HOSPITAL_COMMUNITY): Payer: Self-pay

## 2017-01-22 DIAGNOSIS — F17210 Nicotine dependence, cigarettes, uncomplicated: Secondary | ICD-10-CM | POA: Diagnosis not present

## 2017-01-22 DIAGNOSIS — E119 Type 2 diabetes mellitus without complications: Secondary | ICD-10-CM | POA: Diagnosis not present

## 2017-01-22 DIAGNOSIS — I110 Hypertensive heart disease with heart failure: Secondary | ICD-10-CM | POA: Diagnosis not present

## 2017-01-22 DIAGNOSIS — J449 Chronic obstructive pulmonary disease, unspecified: Secondary | ICD-10-CM | POA: Insufficient documentation

## 2017-01-22 DIAGNOSIS — R3121 Asymptomatic microscopic hematuria: Secondary | ICD-10-CM | POA: Diagnosis not present

## 2017-01-22 DIAGNOSIS — I2510 Atherosclerotic heart disease of native coronary artery without angina pectoris: Secondary | ICD-10-CM | POA: Insufficient documentation

## 2017-01-22 DIAGNOSIS — Z794 Long term (current) use of insulin: Secondary | ICD-10-CM | POA: Insufficient documentation

## 2017-01-22 DIAGNOSIS — G629 Polyneuropathy, unspecified: Secondary | ICD-10-CM | POA: Diagnosis not present

## 2017-01-22 DIAGNOSIS — R202 Paresthesia of skin: Secondary | ICD-10-CM | POA: Diagnosis present

## 2017-01-22 DIAGNOSIS — G608 Other hereditary and idiopathic neuropathies: Secondary | ICD-10-CM

## 2017-01-22 DIAGNOSIS — I5032 Chronic diastolic (congestive) heart failure: Secondary | ICD-10-CM | POA: Insufficient documentation

## 2017-01-22 DIAGNOSIS — Z79899 Other long term (current) drug therapy: Secondary | ICD-10-CM | POA: Diagnosis not present

## 2017-01-22 LAB — BASIC METABOLIC PANEL
Anion gap: 9 (ref 5–15)
BUN: 15 mg/dL (ref 6–20)
CHLORIDE: 104 mmol/L (ref 101–111)
CO2: 24 mmol/L (ref 22–32)
CREATININE: 0.78 mg/dL (ref 0.61–1.24)
Calcium: 8.7 mg/dL — ABNORMAL LOW (ref 8.9–10.3)
Glucose, Bld: 151 mg/dL — ABNORMAL HIGH (ref 65–99)
POTASSIUM: 3.8 mmol/L (ref 3.5–5.1)
SODIUM: 137 mmol/L (ref 135–145)

## 2017-01-22 LAB — URINALYSIS, ROUTINE W REFLEX MICROSCOPIC
BILIRUBIN URINE: NEGATIVE
Bacteria, UA: NONE SEEN
GLUCOSE, UA: NEGATIVE mg/dL
Ketones, ur: NEGATIVE mg/dL
LEUKOCYTES UA: NEGATIVE
NITRITE: NEGATIVE
PROTEIN: NEGATIVE mg/dL
Specific Gravity, Urine: 1.008 (ref 1.005–1.030)
pH: 6 (ref 5.0–8.0)

## 2017-01-22 LAB — CBG MONITORING, ED: Glucose-Capillary: 148 mg/dL — ABNORMAL HIGH (ref 65–99)

## 2017-01-22 LAB — CBC
HCT: 34.3 % — ABNORMAL LOW (ref 39.0–52.0)
Hemoglobin: 11.1 g/dL — ABNORMAL LOW (ref 13.0–17.0)
MCH: 28.8 pg (ref 26.0–34.0)
MCHC: 32.4 g/dL (ref 30.0–36.0)
MCV: 89.1 fL (ref 78.0–100.0)
Platelets: 269 10*3/uL (ref 150–400)
RBC: 3.85 MIL/uL — AB (ref 4.22–5.81)
RDW: 17.7 % — ABNORMAL HIGH (ref 11.5–15.5)
WBC: 5.4 10*3/uL (ref 4.0–10.5)

## 2017-01-22 MED ORDER — GABAPENTIN 300 MG PO CAPS: 300.0000 mg | ORAL_CAPSULE | Freq: Three times a day (TID) | ORAL | 0 refills | Status: DC

## 2017-01-22 MED ORDER — GABAPENTIN 300 MG PO CAPS
300.0000 mg | ORAL_CAPSULE | Freq: Once | ORAL | Status: AC
  Administered 2017-01-23: 300 mg via ORAL
  Filled 2017-01-22: qty 1

## 2017-01-22 NOTE — ED Triage Notes (Signed)
Pt states he became weak over the weekend, he's a diabetic and says that his fingers, legs and feet are numb Pt states that he's received blood and iron transfusions here before

## 2017-01-22 NOTE — ED Provider Notes (Addendum)
TIME SEEN: 11:13 PM  CHIEF COMPLAINT: Numbness and tingling  HPI: Patient is a 76 year old male with history of CAD, COPD, insulin-dependent diabetes, CHF, hypertension, hyperlipidemia, paroxysmal atrial fibrillation no longer on anticoagulation who presents to the emergency department with tingling and burning in his hands and feet.  It appears that this is a chronic symptom for him as it is listed in his past medical history.  He states it has been worse since Friday, October 26.  He has chronic neck pain which is unchanged.  He is on hydrocodone for the past 4 years for his chronic neck pain.  No new neck or back pain.  No headache.  No facial numbness.  No focal weakness.  He ambulates with a cane at baseline.  Reports his blood sugar has been in the 100s.  He was concerned that this could be from his hemoglobin has he has had a history of iron deficiency anemia and has had to have blood transfusion in the past.  Denies fevers, cough, chest pain or shortness of breath, vomiting or diarrhea.  Denies any injury to his arms or legs.  ROS: See HPI Constitutional: no fever  Eyes: no drainage  ENT: no runny nose   Cardiovascular:  no chest pain  Resp: no SOB  GI: no vomiting GU: no dysuria Integumentary: no rash  Allergy: no hives  Musculoskeletal: no leg swelling  Neurological: no slurred speech ROS otherwise negative  PAST MEDICAL HISTORY/PAST SURGICAL HISTORY:  Past Medical History:  Diagnosis Date  . Anemia    takes Ferrous Sulfate daily  . Arthritis    "all over"  . Balance problem 01/2014  . CAP (community acquired pneumonia) 09/18/2014  . Cervical radiculopathy due to degenerative joint disease of spine   . COPD (chronic obstructive pulmonary disease) ([place])   . Coronary artery disease, non-occlusive    a. 03/2010 Nonocclusive disease by cath, performed for ST elevations on ECG;  b. 06/2013 Lexi MV: EF 60%, no ischemia.  . Diabetes mellitus type II    takes Metformin and Lantus  daily  . Diastolic CHF, chronic ([place])    a. 12/2012 EF 55-60%, diast dysfxn, triv MR, mildly dil LA/RA.  [name] History of blood transfusion 1982   "when I had stomach OR"  . History of bronchitis    1998  . History of gastric ulcer   . HTN (hypertension)    takes Diltiazem daily  . Hyperlipidemia    takes Pravastatin daily  . Joint pain   . PAF (paroxysmal atrial fibrillation) (HCC)    Recurrent after atrial flutter (a. 07/2010 Status post caval tricuspid isthmus ablation by Dr. Midge Aver Metoprolol daily), currently controlled on flecainide plus diltiazem and Coumadin  . Weakness    numbness and tingling both hands    MEDICATIONS:  Prior to Admission medications   Medication Sig Start Date End Date Taking? Authorizing Provider  acetaminophen (TYLENOL) 650 MG CR tablet Take 650 mg by mouth 2 (two) times daily as needed for pain.    [provider]  B-D ULTRAFINE III SHORT PEN 31G X 8 MM MISC  01/03/17   [provider]  co-enzyme Q-10 30 MG capsule Take 30 mg by mouth 3 (three) times daily.    [provider]  diltiazem (CARDIZEM CD) 120 MG 24 hr capsule Take 1 capsule (120 mg total) by mouth daily. 10/25/16   Strader, Fransisco Hertz, PA-C  ferrous sulfate 325 (65 FE) MG EC tablet Take 325 mg by mouth  daily with breakfast.    [provider]  flecainide (TAMBOCOR) 150 MG tablet Take 1.5 tablets (225 mg total) by mouth 2 (two) times daily. 10/25/16   Strader, Fransisco Hertz, PA-C  HYDROcodone-acetaminophen (NORCO/VICODIN) 5-325 MG tablet Take 1 tablet by mouth 2 (two) times daily as needed for moderate pain.    [provider]  hydrocortisone cream 1 %  01/02/17   [provider]  Insulin Glargine (LANTUS) 100 UNIT/ML Solostar Pen Inject 8 Units into the skin 2 (two) times daily. 11/30/16   Domenic Polite, MD  loratadine (CLARITIN) 10 MG tablet Take 10 mg by mouth every evening.     [provider]  metFORMIN (GLUCOPHAGE) 1000 MG tablet  Take 1,000 mg by mouth 2 (two) times daily with a meal.     [provider]  metoprolol succinate (TOPROL-XL) 25 MG 24 hr tablet TAKE 1/2 TABLET  (12.5 MG TOTAL) BY MOUTH DAILY. 10/25/16   Strader, Fransisco Hertz, PA-C  Multiple Vitamin (MULTIVITAMIN WITH MINERALS) TABS tablet Take 1 tablet by mouth daily.     [provider]  nitroGLYCERIN (NITROSTAT) 0.4 MG SL tablet Place 1 tablet (0.4 mg total) under the tongue every 5 (five) minutes x 3 doses as needed for chest pain. 11/19/15   Erlene Quan, PA-C  pantoprazole (PROTONIX) 40 MG tablet Take 1 tablet (40 mg total) by mouth 2 (two) times daily before a meal. 11/30/16   Domenic Polite, MD  polyethylene glycol [facility] Floria Raveling) packet Take 17 g by mouth 2 (two) times daily. Patient taking differently: Take 17 g by mouth daily as needed for moderate constipation.  10/03/16   Sheikh, Omair Latif, DO  pravastatin (PRAVACHOL) 40 MG tablet TAKE 1 TABLET EVERY EVENING Patient taking differently: TAKE 40 MG BY MOUTH EVERY EVENING 09/22/16   Leonie Man, MD  senna-docusate (SENOKOT-S) 8.6-50 MG tablet Take 1 tablet by mouth 2 (two) times daily. Patient not taking: Reported on 01/05/2017 10/03/16   Raiford Noble Latif, DO  tamsulosin (FLOMAX) 0.4 MG CAPS capsule Take 0.4 mg by mouth daily.  10/01/16   [provider]    ALLERGIES:  Allergies  Allergen Reactions  . Flexeril [Cyclobenzaprine] Other (See Comments)    Makes it hard for him to stand    SOCIAL HISTORY:  Social History  Substance Use Topics  . Smoking status: Current Every Day Smoker    Packs/day: 0.50    Years: 55.00    Types: Cigarettes  . Smokeless tobacco: Never Used     Comment: He's been smoking between 0.5 and 2 ppd since age 58.  [name] Alcohol use No     Comment: "quit drinking in 1986"    FAMILY HISTORY: Family History  Problem Relation Age of Onset  . Cancer Mother   . Heart attack Father   . Heart attack Brother     EXAM: BP 134/64 (BP  Location: Left Arm)   Pulse 100   Temp 98.2 F (36.8 C) (Oral)   Resp 18   SpO2 95%  CONSTITUTIONAL: Alert and oriented and responds appropriately to questions. Well-appearing; well-nourished HEAD: Normocephalic EYES: Conjunctivae clear, pupils appear equal, EOMI ENT: normal nose; moist mucous membranes NECK: Supple, no meningismus, no nuchal rigidity, no LAD  CARD: RRR; S1 and S2 appreciated; no murmurs, no clicks, no rubs, no gallops RESP: Normal chest excursion without splinting or tachypnea; breath sounds clear and equal bilaterally; no wheezes, no rhonchi, no rales, no hypoxia or respiratory distress, speaking  full sentences ABD/GI: Normal bowel sounds; non-distended; soft, non-tender, no rebound, no guarding, no peritoneal signs, no hepatosplenomegaly BACK:  The back appears normal and is non-tender to palpation, there is no CVA tenderness EXT: Normal ROM in all joints; non-tender to palpation; no edema; normal capillary refill; no cyanosis, no calf tenderness or swelling, 2+ radial and DP pulses bilaterally, extremities are warm and well-perfused, compartments are soft, no joint effusion, no erythema or warmth noted, no ecchymosis or swelling    SKIN: Normal color for age and race; warm; no rash NEURO: Moves all extremities equally; Strength 5/5 in all four extremities.  Normal sensation diffusely.  Reports tingling and burning sensation in both feet hands.  CN 2-12 grossly intact.Normal speech.  Normal gait -ambulates with a cane. PSYCH: The patient's mood and manner are appropriate. Grooming and personal hygiene are appropriate.  MEDICAL DECISION MAKING: Patient here with likely peripheral neuropathy.  His tingling does not follow a dermatome or fit a pattern of a stroke especially given bilateral symptoms.  Doubt intracranial hemorrhage.  Doubt that this is cervical myelopathy, cauda equina, epidural abscess or hematoma, discitis, osteomyelitis, transverse myelitis, MS, other  demyelinating disorder.  He has had these symptoms previously.  I do not feel he needs emergent imaging.  Lab work here today is unremarkable.  Hemoglobin is 11 and is stable.  Blood glucose in the 140s.  Recommended starting him on low-dose gabapentin.  He agrees with this plan.  The only abnormality that we have found is that he has microscopic hematuria.  He recommended follow-up with his primary care for this.  He has an appointment scheduled on November 9.  His neurologic exam today is normal.  His extremities are warm and well perfused.  No sign of infection.  No history of injury.  At this time, I do not feel there is any life-threatening condition present. I have reviewed and discussed all results (EKG, imaging, lab, urine as appropriate) and exam findings with patient/family. I have reviewed nursing notes and appropriate previous records.  I feel the patient is safe to be discharged home without further emergent workup and can continue workup as an outpatient as needed. Discussed usual and customary return precautions. Patient/family verbalize understanding and are comfortable with this plan.  Outpatient follow-up has been provided if needed. All questions have been answered.      Jocob Dambach, Delice Bison, DO 01/22/17 2318    Batoul Limes, Delice Bison, DO 01/22/17 2319

## 2017-01-22 NOTE — Discharge Instructions (Addendum)
You have blood in your urine but no other sign of infection.  Please follow-up with your primary care physician for this.  Your labs otherwise were normal today.

## 2017-01-24 DIAGNOSIS — Z23 Encounter for immunization: Secondary | ICD-10-CM | POA: Diagnosis not present

## 2017-01-25 LAB — URINE CULTURE: Culture: 10000 — AB

## 2017-02-05 DIAGNOSIS — E119 Type 2 diabetes mellitus without complications: Secondary | ICD-10-CM | POA: Diagnosis not present

## 2017-02-05 DIAGNOSIS — Z794 Long term (current) use of insulin: Secondary | ICD-10-CM | POA: Diagnosis not present

## 2017-02-05 DIAGNOSIS — E782 Mixed hyperlipidemia: Secondary | ICD-10-CM | POA: Diagnosis not present

## 2017-02-05 DIAGNOSIS — I10 Essential (primary) hypertension: Secondary | ICD-10-CM | POA: Diagnosis not present

## 2017-02-05 DIAGNOSIS — G8929 Other chronic pain: Secondary | ICD-10-CM | POA: Diagnosis not present

## 2017-02-05 DIAGNOSIS — M7918 Myalgia, other site: Secondary | ICD-10-CM | POA: Diagnosis not present

## 2017-03-06 ENCOUNTER — Encounter (HOSPITAL_COMMUNITY): Payer: Self-pay | Admitting: Internal Medicine

## 2017-03-06 ENCOUNTER — Emergency Department (HOSPITAL_COMMUNITY)
Admission: EM | Admit: 2017-03-06 | Discharge: 2017-03-06 | Disposition: A | Discharge status: Home / Self Care | Payer: Medicare Other | Attending: Emergency Medicine | Admitting: Emergency Medicine

## 2017-03-06 ENCOUNTER — Emergency Department (HOSPITAL_COMMUNITY): Payer: Medicare Other

## 2017-03-06 DIAGNOSIS — Z79899 Other long term (current) drug therapy: Secondary | ICD-10-CM | POA: Diagnosis not present

## 2017-03-06 DIAGNOSIS — Y939 Activity, unspecified: Secondary | ICD-10-CM | POA: Insufficient documentation

## 2017-03-06 DIAGNOSIS — Z7901 Long term (current) use of anticoagulants: Secondary | ICD-10-CM | POA: Diagnosis not present

## 2017-03-06 DIAGNOSIS — I10 Essential (primary) hypertension: Secondary | ICD-10-CM | POA: Insufficient documentation

## 2017-03-06 DIAGNOSIS — T148XXA Other injury of unspecified body region, initial encounter: Secondary | ICD-10-CM | POA: Diagnosis not present

## 2017-03-06 DIAGNOSIS — S9032XA Contusion of left foot, initial encounter: Secondary | ICD-10-CM | POA: Diagnosis not present

## 2017-03-06 DIAGNOSIS — M25551 Pain in right hip: Secondary | ICD-10-CM | POA: Diagnosis not present

## 2017-03-06 DIAGNOSIS — J449 Chronic obstructive pulmonary disease, unspecified: Secondary | ICD-10-CM | POA: Diagnosis not present

## 2017-03-06 DIAGNOSIS — S93401A Sprain of unspecified ligament of right ankle, initial encounter: Secondary | ICD-10-CM

## 2017-03-06 DIAGNOSIS — F17210 Nicotine dependence, cigarettes, uncomplicated: Secondary | ICD-10-CM | POA: Diagnosis not present

## 2017-03-06 DIAGNOSIS — E119 Type 2 diabetes mellitus without complications: Secondary | ICD-10-CM | POA: Diagnosis not present

## 2017-03-06 DIAGNOSIS — Y929 Unspecified place or not applicable: Secondary | ICD-10-CM | POA: Insufficient documentation

## 2017-03-06 DIAGNOSIS — S7001XA Contusion of right hip, initial encounter: Secondary | ICD-10-CM | POA: Diagnosis not present

## 2017-03-06 DIAGNOSIS — S99922A Unspecified injury of left foot, initial encounter: Secondary | ICD-10-CM | POA: Diagnosis not present

## 2017-03-06 DIAGNOSIS — Y999 Unspecified external cause status: Secondary | ICD-10-CM | POA: Insufficient documentation

## 2017-03-06 DIAGNOSIS — S93491A Sprain of other ligament of right ankle, initial encounter: Secondary | ICD-10-CM | POA: Diagnosis not present

## 2017-03-06 DIAGNOSIS — M79672 Pain in left foot: Secondary | ICD-10-CM | POA: Diagnosis not present

## 2017-03-06 DIAGNOSIS — M25571 Pain in right ankle and joints of right foot: Secondary | ICD-10-CM | POA: Diagnosis not present

## 2017-03-06 DIAGNOSIS — S79911A Unspecified injury of right hip, initial encounter: Secondary | ICD-10-CM | POA: Diagnosis not present

## 2017-03-06 DIAGNOSIS — W1830XA Fall on same level, unspecified, initial encounter: Secondary | ICD-10-CM | POA: Diagnosis not present

## 2017-03-06 DIAGNOSIS — I4891 Unspecified atrial fibrillation: Secondary | ICD-10-CM | POA: Diagnosis not present

## 2017-03-06 DIAGNOSIS — Z794 Long term (current) use of insulin: Secondary | ICD-10-CM | POA: Diagnosis not present

## 2017-03-06 MED ORDER — OXYCODONE-ACETAMINOPHEN 5-325 MG PO TABS: 1.0000 | ORAL_TABLET | ORAL | 0 refills | Status: DC | PRN

## 2017-03-06 MED ORDER — MORPHINE SULFATE (PF) 4 MG/ML IV SOLN
4.0000 mg | Freq: Once | INTRAVENOUS | Status: AC
  Administered 2017-03-06: 4 mg via INTRAMUSCULAR
  Filled 2017-03-06: qty 1

## 2017-03-06 MED ORDER — ONDANSETRON 4 MG PO TBDP
4.0000 mg | ORAL_TABLET | Freq: Once | ORAL | Status: AC
  Administered 2017-03-06: 4 mg via ORAL
  Filled 2017-03-06: qty 1

## 2017-03-06 MED ORDER — HYDROCODONE-ACETAMINOPHEN 5-325 MG PO TABS
1.0000 | ORAL_TABLET | Freq: Once | ORAL | Status: AC
  Administered 2017-03-06: 1 via ORAL
  Filled 2017-03-06: qty 1

## 2017-03-06 NOTE — Care Management Note (Addendum)
Case Management Note  Patient Details  Name: [name] MRN: [id number] Date of Birth: 1940-04-12  Subjective/Objective:                  76 y.o. male with right hip, right ankle, and left foot pain.  Action/Plan: CM consulted for HHS.  CM noted pt has used [facility] in the past.  Spoke with pt who states he would like to use [facility] again for [facility] services.  Pt reports he needs additional help during the hours of 11 am to 11 pm while his daughter is at work.  He states he's having trouble getting to the kitchen and making food.  CM spoke with him about private duty aids and advised him that [facility] would be able to discuss those services more.  Faxed pt's information and [place] orders to [facility].  No further CM needs noted at this time.  Expected Discharge Date:   03/06/2017               Expected Discharge Plan:  [place]  Discharge planning Services  CM Consult  Post Acute Care Choice:  Home Health Choice offered to:  Patient  HH Arranged:  RN, PT, OT, Nurse's Aide, Social Work [employer] Agency:  [facility]  Status of Service:  Completed, signed off  Micajah Dennin, Benjaman Lobe, RN 03/06/2017, 6:38 PM

## 2017-03-06 NOTE — ED Triage Notes (Signed)
Pt arrived [facility] via [facility] after falling on Sunday night. Per EMS patient fell forward and twisted his right ankle. He was able to walk to the truck and has a history of right ankle pain. EMS noted swelling to left great toe. Vitals stable.

## 2017-03-06 NOTE — Progress Notes (Signed)
CSW acknowledges for Home Health needs. Please consult RN Case Manager.   No more needs for CSW. CSW signing off.   Wendelyn Breslow, [facility]  [phone]

## 2017-03-06 NOTE — ED Provider Notes (Signed)
[facility] DEPT Provider Note   CSN: [id number] Arrival date & time: 03/06/17  1605     History   Chief Complaint Chief Complaint  Patient presents with  . Fall  . Hip Pain    right hip    HPI [name] is a 76 y.o. male.  Pt presents to the ED today with right hip, right ankle, and left foot pain.  The pt said he fell on Sunday night (12/9).  The pt has been able to walk, but it hurts.  He did not hit his head or have a loc.      Past Medical History:  Diagnosis Date  . Anemia    takes Ferrous Sulfate daily  . Arthritis    "all over"  . Balance problem 01/2014  . CAP (community acquired pneumonia) 09/18/2014  . Cervical radiculopathy due to degenerative joint disease of spine   . COPD (chronic obstructive pulmonary disease) ([place])   . Coronary artery disease, non-occlusive    a. 03/2010 Nonocclusive disease by cath, performed for ST elevations on ECG;  b. 06/2013 Lexi MV: EF 60%, no ischemia.  . Diabetes mellitus type II    takes Metformin and Lantus daily  . Diastolic CHF, chronic ([place])    a. 12/2012 EF 55-60%, diast dysfxn, triv MR, mildly dil LA/RA.  [name] History of blood transfusion 1982   "when I had stomach OR"  . History of bronchitis    1998  . History of gastric ulcer   . HTN (hypertension)    takes Diltiazem daily  . Hyperlipidemia    takes Pravastatin daily  . Joint pain   . PAF (paroxysmal atrial fibrillation) (HCC)    Recurrent after atrial flutter (a. 07/2010 Status post caval tricuspid isthmus ablation by Dr. Midge Aver Metoprolol daily), currently controlled on flecainide plus diltiazem and Coumadin  . Weakness    numbness and tingling both hands    Patient Active Problem List   Diagnosis Date Noted  . Acute blood loss anemia 10/02/2016  . Abdominal pain 10/02/2016  . Constipation 10/02/2016  . Left lower quadrant pain   . Coagulopathy ([place])   . Heme positive stool   . Symptomatic anemia 07/10/2016  .  Syncope 05/15/2015  . Cervical disc disease 05/15/2015  . Abnormal urinalysis 05/15/2015  . Anemia 05/15/2015  . History of CVA (cerebrovascular accident) 04/03/2014  . Near syncope 02/15/2014  . Pre-syncope 02/15/2014  . Benign neoplasm of rectum and anal canal 12/02/2013  . Upper GI bleeding 11/30/2013  . Tobacco use disorder 11/30/2013  . PAF (paroxysmal atrial fibrillation), maintaining SR; CHA2DS2Vasc = 7 08/15/2013  . Anticoagulation goal of INR 2 to 3, for PAF - CHA2DS2Vasc = 7 08/15/2013  . Type 2 diabetes mellitus ([place]) 01/22/2013  . Tobacco abuse counseling 12/15/2012  . Hyperlipidemia   . Obesity (BMI 30-39.9) 12/14/2012  . Atrial flutter - status post CTI ablation 07/29/2010  . Essential hypertension 07/29/2010  . Chronic diastolic congestive heart failure ([place]) 07/29/2010  . Coronary artery disease, non-occlusive 07/29/2010    Past Surgical History:  Procedure Laterality Date  . ATRIAL ABLATION SURGERY  08/05/10   CTI ablation for atrial flutter by JA  . CARDIAC CATHETERIZATION  2012   nl LV function, no occlusive CAD, PAF  . CARDIOVERSION  12/07/2010    Successful direct current cardioversion with atrial fibrillation to normal sinus rhythm  . CARPAL TUNNEL RELEASE Bilateral 01/30/2014   Procedure: BILATERAL CARPAL TUNNEL RELEASE;  Surgeon: Georga Kaufmann  Eduard Roux, MD;  Location: [place];  Service: Orthopedics;  Laterality: Bilateral;  . CATARACT EXTRACTION W/ INTRAOCULAR LENS  IMPLANT, BILATERAL Bilateral   . COLONOSCOPY [name] 12/02/2013   Procedure: COLONOSCOPY;  Surgeon: Irene Shipper, MD;  Location: [place];  Service: Endoscopy;  Laterality: [name];  . ESOPHAGOGASTRODUODENOSCOPY [name] 09/22/2014   Procedure: ESOPHAGOGASTRODUODENOSCOPY (EGD);  Surgeon: Ronald Lobo, MD;  Location[facility] ENDOSCOPY;  Service: Endoscopy;  Laterality: [name];  . ESOPHAGOGASTRODUODENOSCOPY [name] 07/11/2016   Procedure: ESOPHAGOGASTRODUODENOSCOPY (EGD);  Surgeon: Doran Stabler, MD;  Location: [facility]  ENDOSCOPY;  Service: Endoscopy;  Laterality: [name];  . INCISION AND DRAINAGE ABSCESS / HEMATOMA OF BURSA / KNEE / THIGH Left 1998   knee  . KNEE BURSECTOMY Left 1998  . LAPAROSCOPIC CHOLECYSTECTOMY  03/2010  . NM MYOVIEW LTD  07/22/2013   Normal EF ~60%, no ischemia or infarction.  [name] PARTIAL GASTRECTOMY  1982   subtotal; "took out 30% for ulcers"  . TRANSTHORACIC ECHOCARDIOGRAM  02/16/2014   EF 60%, no RWMA. - otherwise normal  . YAG LASER APPLICATION Bilateral        Home Medications    Prior to Admission medications   Medication Sig Start Date End Date Taking? Authorizing Provider  acetaminophen (TYLENOL) 650 MG CR tablet Take 650 mg by mouth 2 (two) times daily as needed for pain.   Yes [provider]  aspirin EC 81 MG tablet Take 81 mg by mouth daily.   Yes [provider]  co-enzyme Q-10 30 MG capsule Take 30 mg by mouth 3 (three) times daily.   Yes [provider]  diltiazem (CARDIZEM CD) 120 MG 24 hr capsule Take 1 capsule (120 mg total) by mouth daily. 10/25/16  Yes Strader, Fransisco Hertz, PA-C  ferrous sulfate 325 (65 FE) MG EC tablet Take 325 mg by mouth daily with breakfast.   Yes [provider]  flecainide (TAMBOCOR) 150 MG tablet Take 1.5 tablets (225 mg total) by mouth 2 (two) times daily. 10/25/16  Yes Strader, Fransisco Hertz, PA-C  HYDROcodone-acetaminophen (NORCO/VICODIN) 5-325 MG tablet Take 1 tablet by mouth 2 (two) times daily as needed for moderate pain.   Yes [provider]  hydrocortisone cream 1 %  01/02/17  Yes [provider]  Insulin Glargine (LANTUS) 100 UNIT/ML Solostar Pen Inject 8 Units into the skin 2 (two) times daily. 11/30/16  Yes Domenic Polite, MD  loratadine (CLARITIN) 10 MG tablet Take 10 mg by mouth every evening.    Yes [provider]  metFORMIN (GLUCOPHAGE) 1000 MG tablet Take 500 mg by mouth 2 (two) times daily with a meal.    Yes [provider]  metoprolol succinate (TOPROL-XL) 25  MG 24 hr tablet TAKE 1/2 TABLET  (12.5 MG TOTAL) BY MOUTH DAILY. 10/25/16  Yes Strader, [place] M, PA-C  Multiple Vitamin (MULTIVITAMIN WITH MINERALS) TABS tablet Take 1 tablet by mouth daily.    Yes [provider]  oxybutynin (DITROPAN-XL) 5 MG 24 hr tablet Take 5 mg by mouth daily. 11/02/16 11/02/17 Yes [provider]  pantoprazole (PROTONIX) 40 MG tablet Take 1 tablet (40 mg total) by mouth 2 (two) times daily before a meal. 11/30/16  Yes Domenic Polite, MD  polyethylene glycol (MIRALAX / GLYCOLAX) packet Take 17 g by mouth 2 (two) times daily. Patient taking differently: Take 17 g by mouth daily as needed for moderate constipation.  10/03/16  Yes Sheikh, Omair Latif, DO  pravastatin (PRAVACHOL) 40 MG tablet TAKE 1 TABLET EVERY  EVENING Patient taking differently: TAKE 40 MG BY MOUTH EVERY EVENING 09/22/16  Yes Leonie Man, MD  tamsulosin (FLOMAX) 0.4 MG CAPS capsule Take 0.4 mg by mouth daily.  10/01/16  Yes [provider]  B-D ULTRAFINE III SHORT PEN 31G X 8 MM MISC  01/03/17   [provider]  gabapentin (NEURONTIN) 300 MG capsule Take 1 capsule (300 mg total) by mouth 3 (three) times daily. Take 1 tablet once a day for 3 days then 1 tablet twice a day for 3 days then 1 tablet 3 times a day. Patient not taking: Reported on 03/06/2017 01/22/17   Ward, Delice Bison, DO  nitroGLYCERIN (NITROSTAT) 0.4 MG SL tablet Place 1 tablet (0.4 mg total) under the tongue every 5 (five) minutes x 3 doses as needed for chest pain. 11/19/15   Erlene Quan, PA-C  oxyCODONE-acetaminophen (PERCOCET/ROXICET) 5-325 MG tablet Take 1 tablet by mouth every 4 (four) hours as needed for severe pain. 03/06/17   Isla Pence, MD  senna-docusate (SENOKOT-S) 8.6-50 MG tablet Take 1 tablet by mouth 2 (two) times daily. Patient not taking: Reported on 01/05/2017 10/03/16   Kerney Elbe, DO    Family History Family History  Problem Relation Age of Onset  . Cancer Mother   . Heart  attack Father   . Heart attack Brother     Social History Social History   Tobacco Use  . Smoking status: Current Every Day Smoker    Packs/day: 0.50    Years: 55.00    Pack years: 27.50    Types: Cigarettes  . Smokeless tobacco: Never Used  . Tobacco comment: He's been smoking between 0.5 and 2 ppd since age 64.  Substance Use Topics  . Alcohol use: No    Alcohol/week: 0.0 oz    Comment: "quit drinking in 1986"  . Drug use: No     Allergies   Cyclobenzaprine   Review of Systems Review of Systems  Musculoskeletal:       Right hip, right ankle, left foot  All other systems reviewed and are negative.    Physical Exam Updated Vital Signs BP (!) 122/49 (BP Location: Right Wrist)   Pulse 82   Resp 18   SpO2 95%   Physical Exam  Constitutional: He is oriented to person, place, and time. He appears well-developed and well-nourished.  HENT:  Head: Normocephalic and atraumatic.  Right Ear: External ear normal.  Left Ear: External ear normal.  Nose: Nose normal.  Mouth/Throat: Oropharynx is clear and moist.  Eyes: Conjunctivae and EOM are normal. Pupils are equal, round, and reactive to light.  Neck: Normal range of motion. Neck supple.  Cardiovascular: Normal rate, regular rhythm, normal heart sounds and intact distal pulses.  Pulmonary/Chest: Effort normal and breath sounds normal.  Abdominal: Soft. Bowel sounds are normal.  Musculoskeletal: Normal range of motion.       Right hip: He exhibits tenderness.       Right ankle: Tenderness. Lateral malleolus tenderness found.       Left foot: There is bony tenderness.  Neurological: He is alert and oriented to person, place, and time.  Skin: Skin is warm. Capillary refill takes less than 2 seconds.  Psychiatric: He has a normal mood and affect. His behavior is normal. Judgment and thought content normal.  Nursing note and vitals reviewed.    ED Treatments / Results  Labs (all labs ordered are listed, but only  abnormal results are displayed) Labs Reviewed -  No data to display  EKG  EKG Interpretation None       Radiology Dg Ankle Complete Right  Result Date: 03/06/2017 CLINICAL DATA:  Right ankle pain after fall 2 days ago. EXAM: RIGHT ANKLE - COMPLETE 3+ VIEW COMPARISON:  None. FINDINGS: There is no evidence of fracture, dislocation, or joint effusion. There is no evidence of arthropathy or other focal bone abnormality. Soft tissues are unremarkable. IMPRESSION: Normal right ankle. Electronically Signed   By: Marijo Conception, M.D.   On: 03/06/2017 17:28   Dg Foot Complete Left  Result Date: 03/06/2017 CLINICAL DATA:  Left foot pain after fall 2 days ago. EXAM: LEFT FOOT - COMPLETE 3+ VIEW COMPARISON:  None. FINDINGS: No fracture or dislocation is noted. Vascular calcifications are noted. Mild posterior calcaneal spurring is noted. Moderate degenerative changes seen involving the first metatarsophalangeal joint. IMPRESSION: Moderate degenerative joint disease of the first metatarsophalangeal joint. No acute abnormality seen in the left foot. Electronically Signed   By: Marijo Conception, M.D.   On: 03/06/2017 17:30   Dg Hip Unilat W Or Wo Pelvis 2-3 Views Right  Result Date: 03/06/2017 CLINICAL DATA:  Right hip pain after fall 2 days ago. EXAM: DG HIP (WITH OR WITHOUT PELVIS) 2-3V RIGHT COMPARISON:  None. FINDINGS: There is no evidence of hip fracture or dislocation. There is no evidence of arthropathy or other focal bone abnormality. IMPRESSION: Normal right hip. Electronically Signed   By: Marijo Conception, M.D.   On: 03/06/2017 17:27    Procedures Procedures (including critical care time)  Medications Ordered in ED Medications  HYDROcodone-acetaminophen (NORCO/VICODIN) 5-325 MG per tablet 1 tablet (not administered)  morphine 4 MG/ML injection 4 mg (4 mg Intramuscular Given 03/06/17 1700)  ondansetron (ZOFRAN-ODT) disintegrating tablet 4 mg (4 mg Oral Given 03/06/17 1700)     Initial  Impression / Assessment and Plan / ED Course  I have reviewed the triage vital signs and the nursing notes.  Pertinent labs & imaging results that were available during my care of the patient were reviewed by me and considered in my medical decision making (see chart for details).    Pt is able to ambulate with a walker.  I consulted case management for home health.  Pt lives alone and has difficulty walking.  He may benefit from home health services.  Final Clinical Impressions(s) / ED Diagnoses   Final diagnoses:  Contusion of right hip, initial encounter  Sprain of right ankle, unspecified ligament, initial encounter  Contusion of left foot, initial encounter    ED Discharge Orders        Ordered    oxyCODONE-acetaminophen (PERCOCET/ROXICET) 5-325 MG tablet  Every 4 hours PRN     03/06/17 1912       Isla Pence, MD 03/06/17 1913

## 2017-03-06 NOTE — ED Notes (Signed)
Bed: [facility] Expected date:  Expected time:  Means of arrival:  Comments: EMS-hip pain 

## 2017-03-06 NOTE — ED Notes (Signed)
Patient ambulated well to bathroom with walker.

## 2017-03-06 NOTE — ED Notes (Signed)
Patient currently speaking with case management on phone.

## 2017-03-09 ENCOUNTER — Emergency Department (HOSPITAL_COMMUNITY): Payer: Medicare Other

## 2017-03-09 ENCOUNTER — Encounter (HOSPITAL_COMMUNITY): Payer: Self-pay | Admitting: *Deleted

## 2017-03-09 ENCOUNTER — Emergency Department (HOSPITAL_COMMUNITY)
Admission: EM | Admit: 2017-03-09 | Discharge: 2017-03-09 | Disposition: A | Discharge status: Home / Self Care | Payer: Medicare Other | Attending: Emergency Medicine | Admitting: Emergency Medicine

## 2017-03-09 DIAGNOSIS — R7401 Elevation of levels of liver transaminase levels: Secondary | ICD-10-CM

## 2017-03-09 DIAGNOSIS — E119 Type 2 diabetes mellitus without complications: Secondary | ICD-10-CM | POA: Diagnosis not present

## 2017-03-09 DIAGNOSIS — Z7982 Long term (current) use of aspirin: Secondary | ICD-10-CM | POA: Diagnosis not present

## 2017-03-09 DIAGNOSIS — R531 Weakness: Secondary | ICD-10-CM | POA: Diagnosis not present

## 2017-03-09 DIAGNOSIS — R109 Unspecified abdominal pain: Secondary | ICD-10-CM | POA: Diagnosis not present

## 2017-03-09 DIAGNOSIS — F17210 Nicotine dependence, cigarettes, uncomplicated: Secondary | ICD-10-CM | POA: Insufficient documentation

## 2017-03-09 DIAGNOSIS — J449 Chronic obstructive pulmonary disease, unspecified: Secondary | ICD-10-CM | POA: Diagnosis not present

## 2017-03-09 DIAGNOSIS — R6883 Chills (without fever): Secondary | ICD-10-CM

## 2017-03-09 DIAGNOSIS — Z7984 Long term (current) use of oral hypoglycemic drugs: Secondary | ICD-10-CM | POA: Insufficient documentation

## 2017-03-09 DIAGNOSIS — I110 Hypertensive heart disease with heart failure: Secondary | ICD-10-CM | POA: Insufficient documentation

## 2017-03-09 DIAGNOSIS — I5032 Chronic diastolic (congestive) heart failure: Secondary | ICD-10-CM | POA: Diagnosis not present

## 2017-03-09 DIAGNOSIS — Z794 Long term (current) use of insulin: Secondary | ICD-10-CM | POA: Diagnosis not present

## 2017-03-09 DIAGNOSIS — R1011 Right upper quadrant pain: Secondary | ICD-10-CM | POA: Diagnosis not present

## 2017-03-09 DIAGNOSIS — R740 Nonspecific elevation of levels of transaminase and lactic acid dehydrogenase [LDH]: Secondary | ICD-10-CM | POA: Diagnosis not present

## 2017-03-09 DIAGNOSIS — R404 Transient alteration of awareness: Secondary | ICD-10-CM | POA: Diagnosis not present

## 2017-03-09 DIAGNOSIS — I2510 Atherosclerotic heart disease of native coronary artery without angina pectoris: Secondary | ICD-10-CM | POA: Diagnosis not present

## 2017-03-09 DIAGNOSIS — N23 Unspecified renal colic: Secondary | ICD-10-CM

## 2017-03-09 DIAGNOSIS — N201 Calculus of ureter: Secondary | ICD-10-CM | POA: Diagnosis not present

## 2017-03-09 DIAGNOSIS — R509 Fever, unspecified: Secondary | ICD-10-CM | POA: Diagnosis not present

## 2017-03-09 LAB — CBC WITH DIFFERENTIAL/PLATELET
Basophils Absolute: 0 10*3/uL (ref 0.0–0.1)
Basophils Relative: 1 %
EOS ABS: 0 10*3/uL (ref 0.0–0.7)
Eosinophils Relative: 0 %
HEMATOCRIT: 28.3 % — AB (ref 39.0–52.0)
HEMOGLOBIN: 8.6 g/dL — AB (ref 13.0–17.0)
LYMPHS ABS: 0.4 10*3/uL — AB (ref 0.7–4.0)
LYMPHS PCT: 7 %
MCH: 24.2 pg — AB (ref 26.0–34.0)
MCHC: 30.4 g/dL (ref 30.0–36.0)
MCV: 79.5 fL (ref 78.0–100.0)
MONOS PCT: 13 %
Monocytes Absolute: 0.7 10*3/uL (ref 0.1–1.0)
NEUTROS ABS: 3.9 10*3/uL (ref 1.7–7.7)
NEUTROS PCT: 79 %
Platelets: 358 10*3/uL (ref 150–400)
RBC: 3.56 MIL/uL — AB (ref 4.22–5.81)
RDW: 18 % — ABNORMAL HIGH (ref 11.5–15.5)
WBC: 4.9 10*3/uL (ref 4.0–10.5)

## 2017-03-09 LAB — URINALYSIS, ROUTINE W REFLEX MICROSCOPIC
BILIRUBIN URINE: NEGATIVE
Glucose, UA: NEGATIVE mg/dL
KETONES UR: 5 mg/dL — AB
Nitrite: NEGATIVE
Protein, ur: NEGATIVE mg/dL
SPECIFIC GRAVITY, URINE: 1.017 (ref 1.005–1.030)
pH: 5 (ref 5.0–8.0)

## 2017-03-09 LAB — INFLUENZA PANEL BY PCR (TYPE A & B)
INFLAPCR: NEGATIVE
Influenza B By PCR: NEGATIVE

## 2017-03-09 LAB — COMPREHENSIVE METABOLIC PANEL
ALK PHOS: 137 U/L — AB (ref 38–126)
ALT: 219 U/L — AB (ref 17–63)
AST: 132 U/L — ABNORMAL HIGH (ref 15–41)
Albumin: 2.9 g/dL — ABNORMAL LOW (ref 3.5–5.0)
Anion gap: 9 (ref 5–15)
BILIRUBIN TOTAL: 0.3 mg/dL (ref 0.3–1.2)
BUN: 20 mg/dL (ref 6–20)
CALCIUM: 8.5 mg/dL — AB (ref 8.9–10.3)
CO2: 26 mmol/L (ref 22–32)
CREATININE: 0.99 mg/dL (ref 0.61–1.24)
Chloride: 104 mmol/L (ref 101–111)
GFR calc non Af Amer: >60 mL/min (ref >60)
Glucose, Bld: 153 mg/dL — ABNORMAL HIGH (ref 65–99)
Potassium: 4 mmol/L (ref 3.5–5.1)
SODIUM: 139 mmol/L (ref 135–145)
TOTAL PROTEIN: 5.9 g/dL — AB (ref 6.5–8.1)

## 2017-03-09 LAB — I-STAT CG4 LACTIC ACID, ED: Lactic Acid, Venous: 0.87 mmol/L (ref 0.5–1.9)

## 2017-03-09 MED ORDER — IOPAMIDOL (ISOVUE-300) INJECTION 61%
INTRAVENOUS | Status: AC
  Filled 2017-03-09: qty 100

## 2017-03-09 MED ORDER — SODIUM CHLORIDE 0.9 % IV BOLUS (SEPSIS)
1000.0000 mL | Freq: Once | INTRAVENOUS | Status: AC
  Administered 2017-03-09: 1000 mL via INTRAVENOUS

## 2017-03-09 MED ORDER — ONDANSETRON HCL 4 MG/2ML IJ SOLN
4.0000 mg | Freq: Once | INTRAMUSCULAR | Status: AC
  Administered 2017-03-09: 4 mg via INTRAVENOUS
  Filled 2017-03-09: qty 2

## 2017-03-09 MED ORDER — CEPHALEXIN 500 MG PO CAPS: 500.0000 mg | ORAL_CAPSULE | Freq: Three times a day (TID) | ORAL | 0 refills | Status: AC

## 2017-03-09 MED ORDER — HYDROCODONE-ACETAMINOPHEN 5-325 MG PO TABS
2.0000 | ORAL_TABLET | Freq: Once | ORAL | Status: AC
  Administered 2017-03-09: 2 via ORAL
  Filled 2017-03-09: qty 2

## 2017-03-09 MED ORDER — KETOROLAC TROMETHAMINE 15 MG/ML IJ SOLN
15.0000 mg | Freq: Once | INTRAMUSCULAR | Status: AC
  Administered 2017-03-09: 15 mg via INTRAVENOUS
  Filled 2017-03-09: qty 1

## 2017-03-09 MED ORDER — MORPHINE SULFATE (PF) 4 MG/ML IV SOLN
4.0000 mg | Freq: Once | INTRAVENOUS | Status: AC
  Administered 2017-03-09: 4 mg via INTRAVENOUS
  Filled 2017-03-09: qty 1

## 2017-03-09 MED ORDER — ONDANSETRON 4 MG PO TBDP
4.0000 mg | ORAL_TABLET | Freq: Once | ORAL | Status: AC
  Administered 2017-03-09: 4 mg via ORAL
  Filled 2017-03-09: qty 1

## 2017-03-09 MED ORDER — GI COCKTAIL ~~LOC~~
30.0000 mL | Freq: Once | ORAL | Status: AC
  Administered 2017-03-09: 30 mL via ORAL
  Filled 2017-03-09: qty 30

## 2017-03-09 MED ORDER — OXYCODONE HCL 5 MG PO TABS: 5.0000 mg | ORAL_TABLET | ORAL | 0 refills | Status: DC | PRN

## 2017-03-09 MED ORDER — ONDANSETRON 4 MG PO TBDP: 4.0000 mg | ORAL_TABLET | Freq: Three times a day (TID) | ORAL | 0 refills | Status: DC | PRN

## 2017-03-09 NOTE — ED Notes (Signed)
Ultrasound at bedside

## 2017-03-09 NOTE — ED Triage Notes (Signed)
Per EMS, pt complains of weakness, chills, sneezing since yesterday. Pt denies nausea, vomiting, diarrhea, pain.

## 2017-03-09 NOTE — Discharge Instructions (Signed)
Today, your lab work was overall reassuring..  Your LIVER ENZYMES were elevated, however. This could be from your vomiting but you need to have these RECHECKED in 2-3 days.  For your flank/side pain, you should follow-up with a UROLOGIST in 1 week  If you develop worsening fever, chills, or other concerning symptoms, return to the ER

## 2017-03-09 NOTE — ED Notes (Signed)
Patient currently on bedside commode having a bowel movement.

## 2017-03-09 NOTE — Care Management Note (Addendum)
Case Management Note  CM noted pt's return to the ED.  Contacted [facility] to advise them that pt was in the ED and was told that pt and daughter refused [facility] services on 03/08/2017.  Updated Dr. Ellender Hose.

## 2017-03-09 NOTE — ED Provider Notes (Signed)
[facility] DEPT Provider Note   CSN: [id number] Arrival date & time: 03/09/17  0904     History   Chief Complaint Chief Complaint  Patient presents with  . Chills  . Weakness    HPI [name] is a 76 y.o. male.  HPI   76 yo M with PMHx as below here with chills, flank pain, nausea. Pt states he started to feel generally unwell yesterday, just "tired." He did not have a good appetite at dinner. He slept and awoke this morning feeling "cold as can be" with chills. Denies any seizure-like activity or LOC. Throughout the morning, he's had nausea, one or two episodes of emesis, as well as a dull, aching right flank pain. No hematuria or dysuria. No CP or SOB. No nausea, diaphoresis. His pain comes and goes, also has an epigatric pain that worse with eating. No known sick contacts. Pain is getting better in the ED.  Past Medical History:  Diagnosis Date  . Anemia    takes Ferrous Sulfate daily  . Arthritis    "all over"  . Balance problem 01/2014  . CAP (community acquired pneumonia) 09/18/2014  . Cervical radiculopathy due to degenerative joint disease of spine   . COPD (chronic obstructive pulmonary disease) ([place])   . Coronary artery disease, non-occlusive    a. 03/2010 Nonocclusive disease by cath, performed for ST elevations on ECG;  b. 06/2013 Lexi MV: EF 60%, no ischemia.  . Diabetes mellitus type II    takes Metformin and Lantus daily  . Diastolic CHF, chronic ([place])    a. 12/2012 EF 55-60%, diast dysfxn, triv MR, mildly dil LA/RA.  [name] History of blood transfusion 1982   "when I had stomach OR"  . History of bronchitis    1998  . History of gastric ulcer   . HTN (hypertension)    takes Diltiazem daily  . Hyperlipidemia    takes Pravastatin daily  . Joint pain   . PAF (paroxysmal atrial fibrillation) (HCC)    Recurrent after atrial flutter (a. 07/2010 Status post caval tricuspid isthmus ablation by Dr. Midge Aver Metoprolol daily),  currently controlled on flecainide plus diltiazem and Coumadin  . Weakness    numbness and tingling both hands    Patient Active Problem List   Diagnosis Date Noted  . Acute blood loss anemia 10/02/2016  . Abdominal pain 10/02/2016  . Constipation 10/02/2016  . Left lower quadrant pain   . Coagulopathy ([place])   . Heme positive stool   . Symptomatic anemia 07/10/2016  . Syncope 05/15/2015  . Cervical disc disease 05/15/2015  . Abnormal urinalysis 05/15/2015  . Anemia 05/15/2015  . History of CVA (cerebrovascular accident) 04/03/2014  . Near syncope 02/15/2014  . Pre-syncope 02/15/2014  . Benign neoplasm of rectum and anal canal 12/02/2013  . Upper GI bleeding 11/30/2013  . Tobacco use disorder 11/30/2013  . PAF (paroxysmal atrial fibrillation), maintaining SR; CHA2DS2Vasc = 7 08/15/2013  . Anticoagulation goal of INR 2 to 3, for PAF - CHA2DS2Vasc = 7 08/15/2013  . Type 2 diabetes mellitus ([place]) 01/22/2013  . Tobacco abuse counseling 12/15/2012  . Hyperlipidemia   . Obesity (BMI 30-39.9) 12/14/2012  . Atrial flutter - status post CTI ablation 07/29/2010  . Essential hypertension 07/29/2010  . Chronic diastolic congestive heart failure ([place]) 07/29/2010  . Coronary artery disease, non-occlusive 07/29/2010    Past Surgical History:  Procedure Laterality Date  . ATRIAL ABLATION SURGERY  08/05/10   CTI ablation for  atrial flutter by JA  . CARDIAC CATHETERIZATION  2012   nl LV function, no occlusive CAD, PAF  . CARDIOVERSION  12/07/2010    Successful direct current cardioversion with atrial fibrillation to normal sinus rhythm  . CARPAL TUNNEL RELEASE Bilateral 01/30/2014   Procedure: BILATERAL CARPAL TUNNEL RELEASE;  Surgeon: Marianna Payment, MD;  Location: [facility];  Service: Orthopedics;  Laterality: Bilateral;  . CATARACT EXTRACTION W/ INTRAOCULAR LENS  IMPLANT, BILATERAL Bilateral   . COLONOSCOPY [name] 12/02/2013   Procedure: COLONOSCOPY;  Surgeon: Irene Shipper, MD;  Location: [facility];  Service: Endoscopy;  Laterality: [name];  . ESOPHAGOGASTRODUODENOSCOPY [name] 09/22/2014   Procedure: ESOPHAGOGASTRODUODENOSCOPY (EGD);  Surgeon: Ronald Lobo, MD;  Location[facility] ENDOSCOPY;  Service: Endoscopy;  Laterality: [name];  . ESOPHAGOGASTRODUODENOSCOPY [name] 07/11/2016   Procedure: ESOPHAGOGASTRODUODENOSCOPY (EGD);  Surgeon: Doran Stabler, MD;  Location: [facility] ENDOSCOPY;  Service: Endoscopy;  Laterality: [name];  . INCISION AND DRAINAGE ABSCESS / HEMATOMA OF BURSA / KNEE / THIGH Left 1998   knee  . KNEE BURSECTOMY Left 1998  . LAPAROSCOPIC CHOLECYSTECTOMY  03/2010  . NM MYOVIEW LTD  07/22/2013   Normal EF ~60%, no ischemia or infarction.  [name] PARTIAL GASTRECTOMY  1982   subtotal; "took out 30% for ulcers"  . TRANSTHORACIC ECHOCARDIOGRAM  02/16/2014   EF 60%, no RWMA. - otherwise normal  . YAG LASER APPLICATION Bilateral        Home Medications    Prior to Admission medications   Medication Sig Start Date End Date Taking? Authorizing Provider  acetaminophen (TYLENOL) 650 MG CR tablet Take 650 mg by mouth 2 (two) times daily as needed for pain.    [provider]  aspirin EC 81 MG tablet Take 81 mg by mouth daily.    [provider]  B-D ULTRAFINE III SHORT PEN 31G X 8 MM MISC  01/03/17   [provider]  cephALEXin (KEFLEX) 500 MG capsule Take 1 capsule (500 mg total) by mouth 3 (three) times daily for 7 days. 03/09/17 03/16/17  Duffy Bruce, MD  co-enzyme Q-10 30 MG capsule Take 30 mg by mouth 3 (three) times daily.    [provider]  diltiazem (CARDIZEM CD) 120 MG 24 hr capsule Take 1 capsule (120 mg total) by mouth daily. 10/25/16   Strader, Fransisco Hertz, PA-C  ferrous sulfate 325 (65 FE) MG EC tablet Take 325 mg by mouth daily with breakfast.    [provider]  flecainide (TAMBOCOR) 150 MG tablet Take 1.5 tablets (225 mg total) by mouth 2 (two) times daily. 10/25/16   Strader, Fransisco Hertz, PA-C  gabapentin (NEURONTIN) 300 MG capsule  Take 1 capsule (300 mg total) by mouth 3 (three) times daily. Take 1 tablet once a day for 3 days then 1 tablet twice a day for 3 days then 1 tablet 3 times a day. Patient not taking: Reported on 03/06/2017 01/22/17   Ward, Delice Bison, DO  HYDROcodone-acetaminophen (NORCO/VICODIN) 5-325 MG tablet Take 1 tablet by mouth 2 (two) times daily as needed for moderate pain.    [provider]  hydrocortisone cream 1 %  01/02/17   [provider]  Insulin Glargine (LANTUS) 100 UNIT/ML Solostar Pen Inject 8 Units into the skin 2 (two) times daily. 11/30/16   Domenic Polite, MD  loratadine (CLARITIN) 10 MG tablet Take 10 mg by mouth every evening.     [provider]  metFORMIN (GLUCOPHAGE) 1000 MG tablet Take 500 mg by mouth  2 (two) times daily with a meal.     [provider]  metoprolol succinate (TOPROL-XL) 25 MG 24 hr tablet TAKE 1/2 TABLET  (12.5 MG TOTAL) BY MOUTH DAILY. 10/25/16   Strader, Fransisco Hertz, PA-C  Multiple Vitamin (MULTIVITAMIN WITH MINERALS) TABS tablet Take 1 tablet by mouth daily.     [provider]  nitroGLYCERIN (NITROSTAT) 0.4 MG SL tablet Place 1 tablet (0.4 mg total) under the tongue every 5 (five) minutes x 3 doses as needed for chest pain. 11/19/15   Erlene Quan, PA-C  ondansetron (ZOFRAN ODT) 4 MG disintegrating tablet Take 1 tablet (4 mg total) by mouth every 8 (eight) hours as needed for nausea or vomiting. 03/09/17   Duffy Bruce, MD  oxybutynin (DITROPAN-XL) 5 MG 24 hr tablet Take 5 mg by mouth daily. 11/02/16 11/02/17  [provider]  oxyCODONE (ROXICODONE) 5 MG immediate release tablet Take 1 tablet (5 mg total) by mouth every 4 (four) hours as needed for severe pain. 03/09/17   Duffy Bruce, MD  oxyCODONE-acetaminophen (PERCOCET/ROXICET) 5-325 MG tablet Take 1 tablet by mouth every 4 (four) hours as needed for severe pain. 03/06/17   Isla Pence, MD  pantoprazole (PROTONIX) 40 MG tablet Take 1 tablet (40 mg total) by  mouth 2 (two) times daily before a meal. 11/30/16   Domenic Polite, MD  polyethylene glycol [facility] / Floria Raveling) packet Take 17 g by mouth 2 (two) times daily. Patient taking differently: Take 17 g by mouth daily as needed for moderate constipation.  10/03/16   Sheikh, Omair Latif, DO  pravastatin (PRAVACHOL) 40 MG tablet TAKE 1 TABLET EVERY EVENING Patient taking differently: TAKE 40 MG BY MOUTH EVERY EVENING 09/22/16   Leonie Man, MD  senna-docusate (SENOKOT-S) 8.6-50 MG tablet Take 1 tablet by mouth 2 (two) times daily. Patient not taking: Reported on 01/05/2017 10/03/16   Raiford Noble Latif, DO  tamsulosin (FLOMAX) 0.4 MG CAPS capsule Take 0.4 mg by mouth daily.  10/01/16   [provider]    Family History Family History  Problem Relation Age of Onset  . Cancer Mother   . Heart attack Father   . Heart attack Brother     Social History Social History   Tobacco Use  . Smoking status: Current Every Day Smoker    Packs/day: 0.50    Years: 55.00    Pack years: 27.50    Types: Cigarettes  . Smokeless tobacco: Never Used  . Tobacco comment: He's been smoking between 0.5 and 2 ppd since age 23.  Substance Use Topics  . Alcohol use: No    Alcohol/week: 0.0 oz    Comment: "quit drinking in 1986"  . Drug use: No     Allergies   Cyclobenzaprine   Review of Systems Review of Systems  Constitutional: Positive for chills and fatigue.  Gastrointestinal: Positive for nausea.  Genitourinary: Positive for flank pain.  Neurological: Positive for weakness.  All other systems reviewed and are negative.    Physical Exam Updated Vital Signs BP 127/76 (BP Location: Left Arm)   Pulse 87   Temp 98.2 F (36.8 C) (Rectal)   Resp 17   SpO2 100%   Physical Exam  Constitutional: He is oriented to person, place, and time. He appears well-developed and well-nourished. No distress.  HENT:  Head: Normocephalic and atraumatic.  Mouth/Throat: Oropharynx is clear and moist. No  oropharyngeal exudate.  Eyes: Conjunctivae are normal.  Neck: Neck supple.  Cardiovascular: Normal rate, regular  rhythm and normal heart sounds. Exam reveals no friction rub.  No murmur heard. Pulmonary/Chest: Effort normal and breath sounds normal. No tachypnea. No respiratory distress. He has no wheezes. He has no rales.  Abdominal: He exhibits no distension. There is tenderness in the right upper quadrant, right lower quadrant and epigastric area.  Musculoskeletal: He exhibits no edema.  Neurological: He is alert and oriented to person, place, and time. He exhibits normal muscle tone.  Skin: Skin is warm. Capillary refill takes less than 2 seconds.  Psychiatric: He has a normal mood and affect.  Nursing note and vitals reviewed.    ED Treatments / Results  Labs (all labs ordered are listed, but only abnormal results are displayed) Labs Reviewed  CBC WITH DIFFERENTIAL/PLATELET - Abnormal; Notable for the following components:      Result Value   RBC 3.56 (*)    Hemoglobin 8.6 (*)    HCT 28.3 (*)    MCH 24.2 (*)    RDW 18.0 (*)    Lymphs Abs 0.4 (*)    All other components within normal limits  COMPREHENSIVE METABOLIC PANEL - Abnormal; Notable for the following components:   Glucose, Bld 153 (*)    Calcium 8.5 (*)    Total Protein 5.9 (*)    Albumin 2.9 (*)    AST 132 (*)    ALT 219 (*)    Alkaline Phosphatase 137 (*)    All other components within normal limits  URINALYSIS, ROUTINE W REFLEX MICROSCOPIC - Abnormal; Notable for the following components:   Color, Urine AMBER (*)    APPearance CLOUDY (*)    Hgb urine dipstick LARGE (*)    Ketones, ur 5 (*)    Leukocytes, UA SMALL (*)    Bacteria, UA MANY (*)    Squamous Epithelial / LPF 0-5 (*)    All other components within normal limits  URINE CULTURE  CULTURE, BLOOD (ROUTINE X 2)  CULTURE, BLOOD (ROUTINE X 2)  INFLUENZA PANEL BY PCR (TYPE A & B)  I-STAT CG4 LACTIC ACID, ED    EKG  EKG  Interpretation  Date/Time:  Friday March 09 2017 16:00:41 EST Ventricular Rate:  85 PR Interval:    QRS Duration: 110 QT Interval:  367 QTC Calculation: 437 R Axis:   -29 Text Interpretation:  Sinus or ectopic ryhthm Prolonged PR interval Borderline left axis deviation No significant change since last tracing Confirmed by Duffy Bruce [phone]) on 03/09/2017 4:15:01 PM       Radiology Dg Chest 2 View  Result Date: 03/09/2017 CLINICAL DATA:  Fever, chills and weakness since yesterday. EXAM: CHEST  2 VIEW COMPARISON:  12/01/2016 FINDINGS: The cardiac silhouette, mediastinal and hilar contours are within normal limits and stable. Mild chronic bronchitic changes but no infiltrates, edema or effusions. No worrisome pulmonary lesions. The bony thorax is intact. IMPRESSION: No acute cardiopulmonary findings. Electronically Signed   By: Marijo Sanes M.D.   On: 03/09/2017 12:28   Ct Abdomen Pelvis W Contrast  Result Date: 03/09/2017 CLINICAL DATA:  Nausea and vomiting EXAM: CT ABDOMEN AND PELVIS WITH CONTRAST TECHNIQUE: Multidetector CT imaging of the abdomen and pelvis was performed using the standard protocol following bolus administration of intravenous contrast. CONTRAST:  100 mL Isovue-300 nonionic COMPARISON:  October 01, 2016 FINDINGS: Lower chest: Lung bases are clear. There is mild lower lobe bronchiectatic change bilaterally. There are foci of coronary artery calcification. Hepatobiliary: No focal liver lesions are apparent. Gallbladder is absent. There is no  appreciable biliary duct dilatation. Pancreas:  There is no pancreatic mass or inflammatory focus. Spleen: No splenic lesions are evident. Adrenals/Urinary Tract: Adrenals appear normal bilaterally. Kidneys bilaterally show no evident mass or hydronephrosis on either side. There is a calculus in the anterior aspect of the lower pole of the right kidney measuring 8 x 6 mm. There is a calculus in the proximal right ureter measuring 4 x 3  mm. This calculus is at the level of L4. No other ureteral calculi are appreciable. Urinary bladder is midline with wall thickness within normal limits. Stomach/Bowel: There is postoperative change in the proximal stomach. Remaining stomach shows mild generalized wall thickness. Stomach is filled with food material and fluid. Elsewhere, there is no appreciable bowel wall or mesenteric thickening. No evident bowel obstruction. No free air or portal venous air evident. Vascular/Lymphatic: There is atherosclerotic calcification in the aorta and common iliac arteries. There is slight widening of the distal abdominal aorta with a maximum transverse diameter 2.7 x 2.6 cm. There are foci of calcification in proximal major mesenteric arterial vessels. No mesenteric vascular occlusion is evident. There is no appreciable adenopathy in the abdomen or pelvis. Reproductive: Prostate and seminal vesicles appear normal in size and contour. There are multiple prostatic calculi. Other: Appendix appears normal. There is no ascites or abscess in the abdomen or pelvis. Musculoskeletal: There is degenerative change in the lumbar spine. There is mild-to-moderate spinal stenosis at L2-3 and L3-4 due to bony hypertrophy and disc bulging. There are no blastic or lytic bone lesions. There is a small bone island in the posterior left iliac crest. There is no intramuscular or abdominal wall lesion. IMPRESSION: 1. There is a 4 x 3 mm calculus in the proximal right ureter at the level of L4 without appreciable hydronephrosis. Within the right kidney, there is an 8 x 6 mm calculus in the lower pole anteriorly. 2. Postoperative change in stomach. Anastomosis patent. There is wall thickening in the stomach suggesting a degree of gastritis. Moderate fluid and food material are present in the stomach. Question degree of gastric emptying. 3.  No bowel obstruction.  No abscess.  Appendix appears normal. 4. There is aortoiliac atherosclerosis. There is  slight dilatation of the distal abdominal aorta to 2.7 x 2.6 cm. Ectatic abdominal aorta at risk for aneurysm development. Recommend followup by ultrasound in 5 years. This recommendation follows ACR consensus guidelines: White Paper of the ACR Incidental Findings Committee II on Vascular Findings. J Am Coll Radiol 2013; 10:789-794. 5.  Multiple prostatic calculi evident. 6. Mild-to-moderate spinal stenosis at L2-3 and L3-4, multifactorial. 7.  Absent gallbladder. 8.  There is a degree of bronchiectatic change in both lower lobes. Aortic Atherosclerosis (ICD10-I70.0). Electronically Signed   By: Lowella Grip III M.D.   On: 03/09/2017 14:54   US Abdomen Limited Ruq  Result Date: 03/09/2017 CLINICAL DATA:  Right sided pain for 5 weeks EXAM: ULTRASOUND ABDOMEN LIMITED RIGHT UPPER QUADRANT COMPARISON:  CT abdomen 10/01/2016 FINDINGS: Gallbladder: Prior cholecystectomy. Common bile duct: Diameter: 6.5 mm Liver: No focal lesion identified. Within normal limits in parenchymal echogenicity. Portal vein is patent on color Doppler imaging with normal direction of blood flow towards the liver. IMPRESSION: 1. Prior cholecystectomy. 2. Otherwise normal right upper quadrant ultrasound. Electronically Signed   By: Kathreen Devoid   On: 03/09/2017 13:44    Procedures Procedures (including critical care time)  Medications Ordered in ED Medications  sodium chloride 0.9 % bolus 1,000 mL (0 mLs Intravenous Stopped 03/09/17  1050)  sodium chloride 0.9 % bolus 1,000 mL (0 mLs Intravenous Stopped 03/09/17 1213)  morphine 4 MG/ML injection 4 mg (4 mg Intravenous Given 03/09/17 1453)  ondansetron (ZOFRAN) injection 4 mg (4 mg Intravenous Given 03/09/17 1453)  gi cocktail (Maalox,Lidocaine,Donnatal) (30 mLs Oral Given 03/09/17 1601)  HYDROcodone-acetaminophen (NORCO/VICODIN) 5-325 MG per tablet 2 tablet (2 tablets Oral Given 03/09/17 1601)  ondansetron (ZOFRAN-ODT) disintegrating tablet 4 mg (4 mg Oral Given 03/09/17  1601)  ketorolac (TORADOL) 15 MG/ML injection 15 mg (15 mg Intravenous Given 03/09/17 1602)     Initial Impression / Assessment and Plan / ED Course  I have reviewed the triage vital signs and the nursing notes.  Pertinent labs & imaging results that were available during my care of the patient were reviewed by me and considered in my medical decision making (see chart for details).     76 yo M with PMHx as above here with multiple complaints, primarily chills, epigastric and R flank pain. Exam as above. VSS and WNL. He is afebrile rectally and orally x 2. Labs are overall reassuring - normal LA, normal WBC. He has mild acute on chronic anemia. No ongoing blood loss. Of note, his CMP has elevated LFTs. Unclear etiology, and I suspect some of this may be 2/2 his vomiting, or possibly 2/2 a viral illness contributing to this. He has no guarding in RUQ. RUQ U/S negative. CT scan obtained and shows R kidney stone which is likely explaining his flank pain, but no hydro, and UA is without signs of UTI though he does have some bacteria. Will cover with keflex, no signs of significant infection at this time. Otherwise, he remains HDS and in NAD in the ED. I've sent cultures given his reported rigors but he is o/w tolerating PO, feels improved, and has been given fluisd here. Will tx for renal colic, d/c with outpt follow-up. Advised no EtOH, no APAP and outpt f/u for his transaminitis, renal stone.  Final Clinical Impressions(s) / ED Diagnoses    Clinical Impression: 1. Transaminitis   2. RUQ pain   3. Chills   4. Renal colic     Disposition: Discharge  Condition: Good  I have discussed the results, Dx and Tx plan with the pt(& family if present). He/she/they expressed understanding and agree(s) with the plan. Discharge instructions discussed at great length. Strict return precautions discussed and pt &/or family have verbalized understanding of the instructions. No further questions at time of  discharge.    This SmartLink is deprecated. Use AVSMEDLIST instead to display the medication list for a patient.  Follow Up: Bernerd Limbo, MD [address] [place] [address] [phone]   FOLLOW-UP IN 2-3 DAYS. IT'S VERY IMPORTANT TO HAVE YOUR LIVER ENZYMES RE-CHECKED AS WELL AS A RE-CHECK OF YOUR PAIN AND SYMPTOMS  [facility] [place] In 1 week      Duffy Bruce, MD 03/09/17 2041

## 2017-03-09 NOTE — ED Notes (Signed)
Patient transported to X-ray 

## 2017-03-09 NOTE — ED Notes (Signed)
Bed: XA12 Expected date:  Expected time:  Means of arrival:  Comments: ems

## 2017-03-10 ENCOUNTER — Telehealth (HOSPITAL_BASED_OUTPATIENT_CLINIC_OR_DEPARTMENT_OTHER): Payer: Self-pay | Admitting: Emergency Medicine

## 2017-03-10 LAB — BLOOD CULTURE ID PANEL (REFLEXED)
ACINETOBACTER BAUMANNII: NOT DETECTED
CANDIDA ALBICANS: NOT DETECTED
CANDIDA GLABRATA: NOT DETECTED
CANDIDA TROPICALIS: NOT DETECTED
Candida krusei: NOT DETECTED
Candida parapsilosis: NOT DETECTED
ENTEROBACTER CLOACAE COMPLEX: NOT DETECTED
ENTEROBACTERIACEAE SPECIES: NOT DETECTED
ENTEROCOCCUS SPECIES: NOT DETECTED
Escherichia coli: NOT DETECTED
HAEMOPHILUS INFLUENZAE: NOT DETECTED
KLEBSIELLA PNEUMONIAE: NOT DETECTED
Klebsiella oxytoca: NOT DETECTED
LISTERIA MONOCYTOGENES: NOT DETECTED
METHICILLIN RESISTANCE: DETECTED — AB
NEISSERIA MENINGITIDIS: NOT DETECTED
Proteus species: NOT DETECTED
Pseudomonas aeruginosa: NOT DETECTED
STREPTOCOCCUS PYOGENES: NOT DETECTED
STREPTOCOCCUS SPECIES: NOT DETECTED
Serratia marcescens: NOT DETECTED
Staphylococcus aureus (BCID): NOT DETECTED
Staphylococcus species: DETECTED — AB
Streptococcus agalactiae: NOT DETECTED
Streptococcus pneumoniae: NOT DETECTED

## 2017-03-11 ENCOUNTER — Emergency Department (HOSPITAL_COMMUNITY)
Admission: EM | Admit: 2017-03-11 | Discharge: 2017-03-12 | Disposition: A | Discharge status: Home / Self Care | Payer: Medicare Other | Attending: Emergency Medicine | Admitting: Emergency Medicine

## 2017-03-11 ENCOUNTER — Other Ambulatory Visit: Payer: Self-pay

## 2017-03-11 ENCOUNTER — Encounter (HOSPITAL_COMMUNITY): Payer: Self-pay | Admitting: Internal Medicine

## 2017-03-11 DIAGNOSIS — I110 Hypertensive heart disease with heart failure: Secondary | ICD-10-CM | POA: Diagnosis not present

## 2017-03-11 DIAGNOSIS — J449 Chronic obstructive pulmonary disease, unspecified: Secondary | ICD-10-CM | POA: Insufficient documentation

## 2017-03-11 DIAGNOSIS — M549 Dorsalgia, unspecified: Secondary | ICD-10-CM | POA: Diagnosis not present

## 2017-03-11 DIAGNOSIS — Z7982 Long term (current) use of aspirin: Secondary | ICD-10-CM | POA: Diagnosis not present

## 2017-03-11 DIAGNOSIS — Z79899 Other long term (current) drug therapy: Secondary | ICD-10-CM | POA: Diagnosis not present

## 2017-03-11 DIAGNOSIS — N23 Unspecified renal colic: Secondary | ICD-10-CM

## 2017-03-11 DIAGNOSIS — E119 Type 2 diabetes mellitus without complications: Secondary | ICD-10-CM | POA: Insufficient documentation

## 2017-03-11 DIAGNOSIS — I2510 Atherosclerotic heart disease of native coronary artery without angina pectoris: Secondary | ICD-10-CM | POA: Diagnosis not present

## 2017-03-11 DIAGNOSIS — N200 Calculus of kidney: Secondary | ICD-10-CM | POA: Diagnosis not present

## 2017-03-11 DIAGNOSIS — I5032 Chronic diastolic (congestive) heart failure: Secondary | ICD-10-CM | POA: Insufficient documentation

## 2017-03-11 DIAGNOSIS — Z794 Long term (current) use of insulin: Secondary | ICD-10-CM | POA: Insufficient documentation

## 2017-03-11 DIAGNOSIS — R1031 Right lower quadrant pain: Secondary | ICD-10-CM | POA: Diagnosis present

## 2017-03-11 DIAGNOSIS — Z9049 Acquired absence of other specified parts of digestive tract: Secondary | ICD-10-CM | POA: Diagnosis not present

## 2017-03-11 DIAGNOSIS — F17210 Nicotine dependence, cigarettes, uncomplicated: Secondary | ICD-10-CM | POA: Insufficient documentation

## 2017-03-11 LAB — URINE CULTURE

## 2017-03-11 LAB — URINALYSIS, ROUTINE W REFLEX MICROSCOPIC
BACTERIA UA: NONE SEEN
BILIRUBIN URINE: NEGATIVE
Glucose, UA: NEGATIVE mg/dL
Ketones, ur: 5 mg/dL — AB
NITRITE: NEGATIVE
Protein, ur: NEGATIVE mg/dL
SPECIFIC GRAVITY, URINE: 1.018 (ref 1.005–1.030)
pH: 7 (ref 5.0–8.0)

## 2017-03-11 MED ORDER — OXYCODONE-ACETAMINOPHEN 5-325 MG PO TABS
1.0000 | ORAL_TABLET | Freq: Once | ORAL | Status: AC
  Administered 2017-03-11: 1 via ORAL
  Filled 2017-03-11: qty 1

## 2017-03-11 MED ORDER — OXYCODONE-ACETAMINOPHEN 5-325 MG PO TABS: 1.0000 | ORAL_TABLET | Freq: Two times a day (BID) | ORAL | 0 refills | Status: DC

## 2017-03-11 MED ORDER — SODIUM CHLORIDE 0.9 % IV BOLUS (SEPSIS)
1000.0000 mL | Freq: Once | INTRAVENOUS | Status: AC
  Administered 2017-03-11: 1000 mL via INTRAVENOUS

## 2017-03-11 MED ORDER — KETOROLAC TROMETHAMINE 15 MG/ML IJ SOLN
15.0000 mg | Freq: Once | INTRAMUSCULAR | Status: AC
  Administered 2017-03-11: 15 mg via INTRAVENOUS
  Filled 2017-03-11: qty 1

## 2017-03-11 MED ORDER — HYDROMORPHONE HCL 1 MG/ML IJ SOLN
1.0000 mg | Freq: Once | INTRAMUSCULAR | Status: AC
  Administered 2017-03-11: 1 mg via INTRAVENOUS
  Filled 2017-03-11: qty 1

## 2017-03-11 NOTE — Discharge Instructions (Signed)
Your liver functions were elevated last visit.  You are instructed to follow-up with your primary care doctor you to get repeat lab work to evaluate your liver function.  Follow-up with your primary care doctor tomorrow to have this blood work done.  Can take the pain medication as directed for pain.  Do not take more than directed.  You need to follow-up with the urologist for further evaluation of your kidney stones.  Return to the emergency department for any worsening pain, fever, vomiting, pain with pain or any other worsening or concerning symptoms.

## 2017-03-11 NOTE — ED Provider Notes (Signed)
[facility] DEPT Provider Note   CSN: [id number] Arrival date & time: 03/11/17  1524     History   Chief Complaint Chief Complaint  Patient presents with  . Nephrolithiasis    HPI [name] is a 76 y.o. male with past medical history of CHF,, kidney stones who presents for evaluation of right flank pain.  Patient reports that he was seen in the ED 2 days ago for evaluation of flank pain and was diagnosed with a kidney stone.  He states that the kidney stone appeared to be stable at that time and he was discharged home.  He states he was also discharged home on an antibiotic.  He states that he was unable to get the antibiotic immediately but has started it yesterday.  Patient comes in the emergency department today because he has had continued right-sided/flank pain.  He reports he had had some improvement but then states that the pain returned today.  He states that he took one hydrocodone at approximately 3 PM prior to ED arrival with minimal improvement.  Patient denies any fevers, chills, nausea/vomiting, dysuria, hematuria.  He has not been evaluated by his primary care doctor or urology since his most recent ED visit.  The history is provided by the patient.    Past Medical History:  Diagnosis Date  . Anemia    takes Ferrous Sulfate daily  . Arthritis    "all over"  . Balance problem 01/2014  . CAP (community acquired pneumonia) 09/18/2014  . Cervical radiculopathy due to degenerative joint disease of spine   . COPD (chronic obstructive pulmonary disease) ([place])   . Coronary artery disease, non-occlusive    a. 03/2010 Nonocclusive disease by cath, performed for ST elevations on ECG;  b. 06/2013 Lexi MV: EF 60%, no ischemia.  . Diabetes mellitus type II    takes Metformin and Lantus daily  . Diastolic CHF, chronic ([place])    a. 12/2012 EF 55-60%, diast dysfxn, triv MR, mildly dil LA/RA.  [name] History of blood transfusion 1982   "when I had  stomach OR"  . History of bronchitis    1998  . History of gastric ulcer   . HTN (hypertension)    takes Diltiazem daily  . Hyperlipidemia    takes Pravastatin daily  . Joint pain   . PAF (paroxysmal atrial fibrillation) (HCC)    Recurrent after atrial flutter (a. 07/2010 Status post caval tricuspid isthmus ablation by Dr. Midge Aver Metoprolol daily), currently controlled on flecainide plus diltiazem and Coumadin  . Weakness    numbness and tingling both hands    Patient Active Problem List   Diagnosis Date Noted  . Acute blood loss anemia 10/02/2016  . Abdominal pain 10/02/2016  . Constipation 10/02/2016  . Left lower quadrant pain   . Coagulopathy ([place])   . Heme positive stool   . Symptomatic anemia 07/10/2016  . Syncope 05/15/2015  . Cervical disc disease 05/15/2015  . Abnormal urinalysis 05/15/2015  . Anemia 05/15/2015  . History of CVA (cerebrovascular accident) 04/03/2014  . Near syncope 02/15/2014  . Pre-syncope 02/15/2014  . Benign neoplasm of rectum and anal canal 12/02/2013  . Upper GI bleeding 11/30/2013  . Tobacco use disorder 11/30/2013  . PAF (paroxysmal atrial fibrillation), maintaining SR; CHA2DS2Vasc = 7 08/15/2013  . Anticoagulation goal of INR 2 to 3, for PAF - CHA2DS2Vasc = 7 08/15/2013  . Type 2 diabetes mellitus ([place]) 01/22/2013  . Tobacco abuse counseling 12/15/2012  . Hyperlipidemia   .  Obesity (BMI 30-39.9) 12/14/2012  . Atrial flutter - status post CTI ablation 07/29/2010  . Essential hypertension 07/29/2010  . Chronic diastolic congestive heart failure ([place]) 07/29/2010  . Coronary artery disease, non-occlusive 07/29/2010    Past Surgical History:  Procedure Laterality Date  . ATRIAL ABLATION SURGERY  08/05/10   CTI ablation for atrial flutter by JA  . CARDIAC CATHETERIZATION  2012   nl LV function, no occlusive CAD, PAF  . CARDIOVERSION  12/07/2010    Successful direct current cardioversion with atrial fibrillation to normal sinus  rhythm  . CARPAL TUNNEL RELEASE Bilateral 01/30/2014   Procedure: BILATERAL CARPAL TUNNEL RELEASE;  Surgeon: Marianna Payment, MD;  Location: [facility];  Service: Orthopedics;  Laterality: Bilateral;  . CATARACT EXTRACTION W/ INTRAOCULAR LENS  IMPLANT, BILATERAL Bilateral   . COLONOSCOPY [name] 12/02/2013   Procedure: COLONOSCOPY;  Surgeon: Irene Shipper, MD;  Location: [facility];  Service: Endoscopy;  Laterality: [name];  . ESOPHAGOGASTRODUODENOSCOPY [name] 09/22/2014   Procedure: ESOPHAGOGASTRODUODENOSCOPY (EGD);  Surgeon: Ronald Lobo, MD;  Location[facility] ENDOSCOPY;  Service: Endoscopy;  Laterality: [name];  . ESOPHAGOGASTRODUODENOSCOPY [name] 07/11/2016   Procedure: ESOPHAGOGASTRODUODENOSCOPY (EGD);  Surgeon: Doran Stabler, MD;  Location: [facility] ENDOSCOPY;  Service: Endoscopy;  Laterality: [name];  . INCISION AND DRAINAGE ABSCESS / HEMATOMA OF BURSA / KNEE / THIGH Left 1998   knee  . KNEE BURSECTOMY Left 1998  . LAPAROSCOPIC CHOLECYSTECTOMY  03/2010  . NM MYOVIEW LTD  07/22/2013   Normal EF ~60%, no ischemia or infarction.  [name] PARTIAL GASTRECTOMY  1982   subtotal; "took out 30% for ulcers"  . TRANSTHORACIC ECHOCARDIOGRAM  02/16/2014   EF 60%, no RWMA. - otherwise normal  . YAG LASER APPLICATION Bilateral        Home Medications    Prior to Admission medications   Medication Sig Start Date End Date Taking? Authorizing Provider  acetaminophen (TYLENOL) 650 MG CR tablet Take 650 mg by mouth 2 (two) times daily as needed for pain.   Yes [provider]  aspirin EC 81 MG tablet Take 81 mg by mouth daily.   Yes [provider]  cephALEXin (KEFLEX) 500 MG capsule Take 1 capsule (500 mg total) by mouth 3 (three) times daily for 7 days. 03/09/17 03/16/17 Yes Duffy Bruce, MD  co-enzyme Q-10 30 MG capsule Take 30 mg by mouth 3 (three) times daily.   Yes [provider]  diltiazem (CARDIZEM CD) 120 MG 24 hr capsule Take 1 capsule (120 mg total) by mouth daily. 10/25/16  Yes Strader,  Fransisco Hertz, PA-C  ferrous sulfate 325 (65 FE) MG EC tablet Take 325 mg by mouth daily with breakfast.   Yes [provider]  flecainide (TAMBOCOR) 150 MG tablet Take 1.5 tablets (225 mg total) by mouth 2 (two) times daily. 10/25/16  Yes Strader, Fransisco Hertz, PA-C  HYDROcodone-acetaminophen (NORCO/VICODIN) 5-325 MG tablet Take 1 tablet by mouth 2 (two) times daily as needed for moderate pain.   Yes [provider]  hydrocortisone cream 1 % Apply 1 application topically 2 (two) times daily.  01/02/17  Yes [provider]  Insulin Glargine (LANTUS) 100 UNIT/ML Solostar Pen Inject 8 Units into the skin 2 (two) times daily. 11/30/16  Yes Domenic Polite, MD  loratadine (CLARITIN) 10 MG tablet Take 10 mg by mouth every evening.    Yes [provider]  metFORMIN (GLUCOPHAGE) 1000 MG tablet Take 500 mg by mouth 2 (two) times daily with a meal.  Yes [provider]  metoprolol succinate (TOPROL-XL) 25 MG 24 hr tablet TAKE 1/2 TABLET  (12.5 MG TOTAL) BY MOUTH DAILY. 10/25/16  Yes Strader, [place] M, PA-C  Multiple Vitamin (MULTIVITAMIN WITH MINERALS) TABS tablet Take 1 tablet by mouth daily.    Yes [provider]  ondansetron (ZOFRAN ODT) 4 MG disintegrating tablet Take 1 tablet (4 mg total) by mouth every 8 (eight) hours as needed for nausea or vomiting. 03/09/17  Yes Duffy Bruce, MD  oxybutynin (DITROPAN-XL) 5 MG 24 hr tablet Take 5 mg by mouth daily. 11/02/16 11/02/17 Yes [provider]  oxyCODONE (ROXICODONE) 5 MG immediate release tablet Take 1 tablet (5 mg total) by mouth every 4 (four) hours as needed for severe pain. 03/09/17  Yes Duffy Bruce, MD  pantoprazole (PROTONIX) 40 MG tablet Take 1 tablet (40 mg total) by mouth 2 (two) times daily before a meal. 11/30/16  Yes Domenic Polite, MD  polyethylene glycol [facility] / GLYCOLAX) packet Take 17 g by mouth 2 (two) times daily. Patient taking differently: Take 17 g by mouth daily as needed for  moderate constipation.  10/03/16  Yes Sheikh, [place], DO  pravastatin (PRAVACHOL) 40 MG tablet TAKE 1 TABLET EVERY EVENING Patient taking differently: TAKE 40 MG BY MOUTH EVERY EVENING 09/22/16  Yes Leonie Man, MD  tamsulosin (FLOMAX) 0.4 MG CAPS capsule Take 0.4 mg by mouth daily. 02/20/17  Yes [provider]  B-D ULTRAFINE III SHORT PEN 31G X 8 MM MISC  01/03/17   [provider]  gabapentin (NEURONTIN) 300 MG capsule Take 1 capsule (300 mg total) by mouth 3 (three) times daily. Take 1 tablet once a day for 3 days then 1 tablet twice a day for 3 days then 1 tablet 3 times a day. Patient not taking: Reported on 03/11/2017 01/22/17   Ward, Delice Bison, DO  nitroGLYCERIN (NITROSTAT) 0.4 MG SL tablet Place 1 tablet (0.4 mg total) under the tongue every 5 (five) minutes x 3 doses as needed for chest pain. 11/19/15   Erlene Quan, PA-C  oxyCODONE-acetaminophen (PERCOCET/ROXICET) 5-325 MG tablet Take 1 tablet by mouth every 12 (twelve) hours. 03/11/17   Volanda Napoleon, PA-C  senna-docusate (SENOKOT-S) 8.6-50 MG tablet Take 1 tablet by mouth 2 (two) times daily. Patient not taking: Reported on 01/05/2017 10/03/16   Kerney Elbe, DO    Family History Family History  Problem Relation Age of Onset  . Cancer Mother   . Heart attack Father   . Heart attack Brother     Social History Social History   Tobacco Use  . Smoking status: Current Every Day Smoker    Packs/day: 0.50    Years: 55.00    Pack years: 27.50    Types: Cigarettes  . Smokeless tobacco: Never Used  . Tobacco comment: He's been smoking between 0.5 and 2 ppd since age 32.  Substance Use Topics  . Alcohol use: No    Alcohol/week: 0.0 oz    Comment: "quit drinking in 1986"  . Drug use: No     Allergies   Cyclobenzaprine   Review of Systems Review of Systems  Constitutional: Negative for fever.  Gastrointestinal: Negative for abdominal pain, nausea and vomiting.  Genitourinary:  Positive for flank pain. Negative for dysuria and hematuria.     Physical Exam Updated Vital Signs BP (!) 110/51 (BP Location: Right Arm)   Pulse 81   Temp 98.1 F (36.7 C) (Oral)   Resp 16  Ht 5\' 6"  (1.676 m)   Wt 72.6 kg (160 lb)   SpO2 99%   BMI 25.82 kg/m   Physical Exam  Constitutional: He is oriented to person, place, and time. He appears well-developed and well-nourished.  Appears uncomfortable but no acute distress   HENT:  Head: Normocephalic and atraumatic.  Mouth/Throat: Oropharynx is clear and moist and mucous membranes are normal.  Eyes: Conjunctivae, EOM and lids are normal. Pupils are equal, round, and reactive to light.  Neck: Full passive range of motion without pain.  Cardiovascular: Normal rate, regular rhythm, normal heart sounds and normal pulses. Exam reveals no gallop and no friction rub.  No murmur heard. Pulmonary/Chest: Effort normal and breath sounds normal.  Abdominal: Soft. Normal appearance. There is no tenderness. There is CVA tenderness. There is no rigidity and no guarding.  Right-sided CVA tenderness that extends over to the right lateral side and down into the right lower abdomen.  No back Burney's point tenderness.  Abdomen is without rigidity or guarding.  No peritoneal signs.  Musculoskeletal: Normal range of motion.  Neurological: He is alert and oriented to person, place, and time.  Skin: Skin is warm and dry. Capillary refill takes less than 2 seconds.  Psychiatric: He has a normal mood and affect. His speech is normal.  Nursing note and vitals reviewed.    ED Treatments / Results  Labs (all labs ordered are listed, but only abnormal results are displayed) Labs Reviewed  URINALYSIS, ROUTINE W REFLEX MICROSCOPIC - Abnormal; Notable for the following components:      Result Value   Hgb urine dipstick MODERATE (*)    Ketones, ur 5 (*)    Leukocytes, UA TRACE (*)    Squamous Epithelial / LPF 0-5 (*)    All other components within  normal limits    EKG  EKG Interpretation None       Radiology No results found.  Procedures Procedures (including critical care time)  Medications Ordered in ED Medications  ketorolac (TORADOL) 15 MG/ML injection 15 mg (15 mg Intravenous Given 03/11/17 1748)  sodium chloride 0.9 % bolus 1,000 mL (0 mLs Intravenous Stopped 03/11/17 2020)  HYDROmorphone (DILAUDID) injection 1 mg (1 mg Intravenous Given 03/11/17 2018)  oxyCODONE-acetaminophen (PERCOCET/ROXICET) 5-325 MG per tablet 1 tablet (1 tablet Oral Given 03/11/17 2321)     Initial Impression / Assessment and Plan / ED Course  I have reviewed the triage vital signs and the nursing notes.  Pertinent labs & imaging results that were available during my care of the patient were reviewed by me and considered in my medical decision making (see chart for details).     76 year old male who presents for evaluation of continued right flank pain.  Seen in the emergency department 2 days ago and diagnosed with a kidney stone.  Discharged home on Keflex.  Patient reports that he started taking it yesterday.  Pain had improved but returned today.  He took hydrocodone at 3 PM with minimal improvement in symptoms.  No urinary complaints. Patient is afebrile, non-toxic appearing, sitting comfortably on examination table. Vital signs reviewed and stable.  Patient has significant right-sided CVA tenderness that wraps around to the right flank and right abdomen.  Consider UTI versus worsening kidney stone pain.  History/physical exam not concerning for aortic dissection.  Will give analgesics here in the department.  Will obtain UA for further evaluation.  Repeat vitals show patient is slightly hypertensive, will give fluid resuscitation here in the department.  UA reviewed.  There is moderate hemoglobin.  There is trace leukocytes.  Discussed with patient.  He reports improvement in pain after initial Toradol but still having some pain in the we  will plan to give an additional dose of pain medication.  Reevaluation.  Patient reports improvement in pain he.  He has improvement in CVA tenderness.  We will plan to give pain medication to go home with.  Patient recently had a transaminitis on last lab work.  We will plan to hepatically dose the Percocet to help with pain.  Patient is scheduled to follow-up with his primary care doctor in the next 2 days to follow-up regarding repeat HFT's.  Encouraged him to keep that appointment.  Also encouraged him to follow-up with urology for evaluation of his kidney stones. Patient had ample opportunity for questions and discussion. All patient's questions were answered with full understanding. Strict return precautions discussed. Patient expresses understanding and agreement to plan.    Final Clinical Impressions(s) / ED Diagnoses   Final diagnoses:  Renal colic on right side    ED Discharge Orders        Ordered    oxyCODONE-acetaminophen (PERCOCET/ROXICET) 5-325 MG tablet  Every 12 hours     03/11/17 2223       Desma Mcgregor 03/12/17 2208    Milton Ferguson, MD 03/13/17 [phone]

## 2017-03-11 NOTE — ED Notes (Signed)
Awaiting [facility] for transport back to residence.

## 2017-03-11 NOTE — ED Notes (Signed)
Bed: OT77 Expected date:  Expected time:  Means of arrival:  Comments: hold

## 2017-03-11 NOTE — ED Notes (Signed)
ED Provider at bedside. 

## 2017-03-11 NOTE — ED Triage Notes (Signed)
Pt arrived via [facility] from home complaining of pain from back pain associated with kidney stones. He was diagnosed with kidney stones 2 days ago and was put on antibiotics. Patient began taking antibiotics as prescribed yesterday and is complaining that they haven't started to work yet. Per EMS, pain is 10/10. Patient is alert and oriented x4 and walks with no problem with a cane. No pain on abdominal palpation.

## 2017-03-11 NOTE — ED Notes (Signed)
Pt given discharge instructions and no questions at this time. Pt currently awaiting transport back to residence.

## 2017-03-12 ENCOUNTER — Telehealth: Payer: Self-pay | Admitting: Emergency Medicine

## 2017-03-12 LAB — CULTURE, BLOOD (ROUTINE X 2): Special Requests: ADEQUATE

## 2017-03-12 NOTE — Progress Notes (Signed)
ED Antimicrobial Stewardship Positive Culture Follow Up   [name] is an 76 y.o. male who presented to [facility] on 03/09/2017 with a chief complaint of  Chief Complaint  Patient presents with  . Chills  . Weakness    Recent Results (from the past 720 hour(s))  Urine culture     Status: Abnormal   Collection Time: 03/09/17  4:13 PM  Result Value Ref Range Status   Specimen Description URINE, CLEAN CATCH  Final   Special Requests NONE  Final   Culture >=100,000 COLONIES/mL MORGANELLA MORGANII (A)  Final   Report Status 03/11/2017 FINAL  Final   Organism ID, Bacteria MORGANELLA MORGANII (A)  Final      Susceptibility   Morganella morganii - MIC*    AMPICILLIN >=32 RESISTANT Resistant     CEFAZOLIN >=64 RESISTANT Resistant     CEFTRIAXONE <=1 SENSITIVE Sensitive     CIPROFLOXACIN <=0.25 SENSITIVE Sensitive     GENTAMICIN <=1 SENSITIVE Sensitive     IMIPENEM 4 SENSITIVE Sensitive     NITROFURANTOIN 128 RESISTANT Resistant     TRIMETH/SULFA <=20 SENSITIVE Sensitive     AMPICILLIN/SULBACTAM 8 SENSITIVE Sensitive     PIP/TAZO <=4 SENSITIVE Sensitive     * >=100,000 COLONIES/mL MORGANELLA MORGANII  Blood culture (routine x 2)     Status: None (Preliminary result)   Collection Time: 03/09/17  4:13 PM  Result Value Ref Range Status   Specimen Description BLOOD LEFT HAND  Final   Special Requests   Final    BOTTLES DRAWN AEROBIC AND ANAEROBIC Blood Culture adequate volume   Culture   Final    NO GROWTH 2 DAYS Performed at [facility], [address], [place], [place] [address]    Report Status PENDING  Incomplete  Blood culture (routine x 2)     Status: Abnormal   Collection Time: 03/09/17  4:18 PM  Result Value Ref Range Status   Specimen Description BLOOD RIGHT ANTECUBITAL  Final   Special Requests   Final    BOTTLES DRAWN AEROBIC AND ANAEROBIC Blood Culture adequate volume   Culture  Setup Time   Final    GRAM POSITIVE COCCI IN CLUSTERS AEROBIC BOTTLE  ONLY CRITICAL RESULT CALLED TO, READ BACK BY AND VERIFIED WITH: S GOUGE,RN AT 3007 03/10/17 BY L BENFIELD    Culture (A)  Final    STAPHYLOCOCCUS SPECIES (COAGULASE NEGATIVE) THE SIGNIFICANCE OF ISOLATING THIS ORGANISM FROM A SINGLE SET OF BLOOD CULTURES WHEN MULTIPLE SETS ARE DRAWN IS UNCERTAIN. PLEASE NOTIFY THE MICROBIOLOGY DEPARTMENT WITHIN ONE WEEK IF SPECIATION AND SENSITIVITIES ARE REQUIRED. Performed at [facility], [place] [address], [place], [place] [address]    Report Status 03/12/2017 FINAL  Final  Blood Culture ID Panel (Reflexed)     Status: Abnormal   Collection Time: 03/09/17  4:18 PM  Result Value Ref Range Status   Enterococcus species NOT DETECTED NOT DETECTED Final   Listeria monocytogenes NOT DETECTED NOT DETECTED Final   Staphylococcus species DETECTED (A) NOT DETECTED Final    Comment: Methicillin (oxacillin) resistant coagulase negative staphylococcus. Possible blood culture contaminant (unless isolated from more than one blood culture draw or clinical case suggests pathogenicity). No antibiotic treatment is indicated for blood  culture contaminants. CRITICAL RESULT CALLED TO, READ BACK BY AND VERIFIED WITH: S GOUGE,RN AT 1753 03/10/17 BY L BENFIELD    Staphylococcus aureus NOT DETECTED NOT DETECTED Final   Methicillin resistance DETECTED (A) NOT DETECTED Final  Comment: CRITICAL RESULT CALLED TO, READ BACK BY AND VERIFIED WITH: S GOUGE,RN AT 1753 03/10/17 BY L BENFIELD    Streptococcus species NOT DETECTED NOT DETECTED Final   Streptococcus agalactiae NOT DETECTED NOT DETECTED Final   Streptococcus pneumoniae NOT DETECTED NOT DETECTED Final   Streptococcus pyogenes NOT DETECTED NOT DETECTED Final   Acinetobacter baumannii NOT DETECTED NOT DETECTED Final   Enterobacteriaceae species NOT DETECTED NOT DETECTED Final   Enterobacter cloacae complex NOT DETECTED NOT DETECTED Final   Escherichia coli NOT DETECTED NOT DETECTED Final   Klebsiella oxytoca NOT  DETECTED NOT DETECTED Final   Klebsiella pneumoniae NOT DETECTED NOT DETECTED Final   Proteus species NOT DETECTED NOT DETECTED Final   Serratia marcescens NOT DETECTED NOT DETECTED Final   Haemophilus influenzae NOT DETECTED NOT DETECTED Final   Neisseria meningitidis NOT DETECTED NOT DETECTED Final   Pseudomonas aeruginosa NOT DETECTED NOT DETECTED Final   Candida albicans NOT DETECTED NOT DETECTED Final   Candida glabrata NOT DETECTED NOT DETECTED Final   Candida krusei NOT DETECTED NOT DETECTED Final   Candida parapsilosis NOT DETECTED NOT DETECTED Final   Candida tropicalis NOT DETECTED NOT DETECTED Final    Comment: Performed at [facility], [place]. [address], [place], [place] [address]    [x]  Treated with Keflex, organism resistant to prescribed antimicrobial []  Patient discharged originally without antimicrobial agent and treatment is now indicated  New antibiotic prescription: Augmentin 500 mg bid x 7 days  ED Provider: Eliezer Mccoy, PA-C  Lawson Radar 03/12/2017, 9:03 AM Infectious Diseases Pharmacist Phone# [phone]

## 2017-03-12 NOTE — Telephone Encounter (Signed)
Post ED Visit - Positive Culture Follow-up: Successful Patient Follow-Up  Culture assessed and recommendations reviewed by: []  Elenor Quinones, Pharm.D. []  Heide Guile, Pharm.D., BCPS AQ-ID []  Parks Neptune, Pharm.D., BCPS [x]  Alycia Rossetti, Pharm.D., BCPS []  [place], Pharm.D., BCPS, AAHIVP []  Legrand Como, Pharm.D., BCPS, AAHIVP []  Salome Arnt, PharmD, BCPS []  Dimitri Ped, PharmD, BCPS []  Vincenza Hews, PharmD, BCPS  Positive urine culture  []  Patient discharged without antimicrobial prescription and treatment is now indicated [x]  Organism is resistant to prescribed ED discharge antimicrobial []  Patient with positive blood cultures  Changes discussed with ED provider: Eliezer Mccoy PA New antibiotic prescription d/c keflex and change to Augmentin 500mg  po bid x 7 days  Attempting to contact patient    Hazle Nordmann 03/12/2017, 1:42 PM

## 2017-03-13 DIAGNOSIS — M549 Dorsalgia, unspecified: Secondary | ICD-10-CM | POA: Diagnosis not present

## 2017-03-13 DIAGNOSIS — N200 Calculus of kidney: Secondary | ICD-10-CM | POA: Diagnosis not present

## 2017-03-14 LAB — CULTURE, BLOOD (ROUTINE X 2)
Culture: NO GROWTH
SPECIAL REQUESTS: ADEQUATE

## 2017-03-18 ENCOUNTER — Emergency Department (HOSPITAL_COMMUNITY): Payer: Medicare Other

## 2017-03-18 ENCOUNTER — Other Ambulatory Visit: Payer: Self-pay

## 2017-03-18 ENCOUNTER — Encounter (HOSPITAL_COMMUNITY): Payer: Self-pay

## 2017-03-18 ENCOUNTER — Emergency Department (HOSPITAL_COMMUNITY)
Admission: EM | Admit: 2017-03-18 | Discharge: 2017-03-18 | Disposition: A | Discharge status: Home / Self Care | Payer: Medicare Other | Attending: Emergency Medicine | Admitting: Emergency Medicine

## 2017-03-18 DIAGNOSIS — E119 Type 2 diabetes mellitus without complications: Secondary | ICD-10-CM | POA: Insufficient documentation

## 2017-03-18 DIAGNOSIS — I5032 Chronic diastolic (congestive) heart failure: Secondary | ICD-10-CM | POA: Insufficient documentation

## 2017-03-18 DIAGNOSIS — N201 Calculus of ureter: Secondary | ICD-10-CM | POA: Diagnosis not present

## 2017-03-18 DIAGNOSIS — Z8673 Personal history of transient ischemic attack (TIA), and cerebral infarction without residual deficits: Secondary | ICD-10-CM | POA: Insufficient documentation

## 2017-03-18 DIAGNOSIS — M5489 Other dorsalgia: Secondary | ICD-10-CM | POA: Diagnosis not present

## 2017-03-18 DIAGNOSIS — Z79899 Other long term (current) drug therapy: Secondary | ICD-10-CM | POA: Insufficient documentation

## 2017-03-18 DIAGNOSIS — R000 Tachycardia, unspecified: Secondary | ICD-10-CM | POA: Diagnosis not present

## 2017-03-18 DIAGNOSIS — F17210 Nicotine dependence, cigarettes, uncomplicated: Secondary | ICD-10-CM | POA: Diagnosis not present

## 2017-03-18 DIAGNOSIS — I2510 Atherosclerotic heart disease of native coronary artery without angina pectoris: Secondary | ICD-10-CM | POA: Insufficient documentation

## 2017-03-18 DIAGNOSIS — N200 Calculus of kidney: Secondary | ICD-10-CM | POA: Diagnosis not present

## 2017-03-18 DIAGNOSIS — Z794 Long term (current) use of insulin: Secondary | ICD-10-CM | POA: Insufficient documentation

## 2017-03-18 DIAGNOSIS — Z7982 Long term (current) use of aspirin: Secondary | ICD-10-CM | POA: Insufficient documentation

## 2017-03-18 DIAGNOSIS — R109 Unspecified abdominal pain: Secondary | ICD-10-CM | POA: Diagnosis not present

## 2017-03-18 DIAGNOSIS — J449 Chronic obstructive pulmonary disease, unspecified: Secondary | ICD-10-CM | POA: Diagnosis not present

## 2017-03-18 DIAGNOSIS — I110 Hypertensive heart disease with heart failure: Secondary | ICD-10-CM | POA: Insufficient documentation

## 2017-03-18 DIAGNOSIS — M549 Dorsalgia, unspecified: Secondary | ICD-10-CM | POA: Diagnosis not present

## 2017-03-18 LAB — CBC WITH DIFFERENTIAL/PLATELET
Basophils Absolute: 0.1 10*3/uL (ref 0.0–0.1)
Basophils Relative: 1 %
Eosinophils Absolute: 0.1 10*3/uL (ref 0.0–0.7)
Eosinophils Relative: 1 %
HEMATOCRIT: 27.1 % — AB (ref 39.0–52.0)
Hemoglobin: 8.1 g/dL — ABNORMAL LOW (ref 13.0–17.0)
LYMPHS PCT: 18 %
Lymphs Abs: 1.6 10*3/uL (ref 0.7–4.0)
MCH: 23.4 pg — ABNORMAL LOW (ref 26.0–34.0)
MCHC: 29.9 g/dL — AB (ref 30.0–36.0)
MCV: 78.3 fL (ref 78.0–100.0)
MONOS PCT: 8 %
Monocytes Absolute: 0.7 10*3/uL (ref 0.1–1.0)
NEUTROS ABS: 6.4 10*3/uL (ref 1.7–7.7)
Neutrophils Relative %: 72 %
Platelets: 415 10*3/uL — ABNORMAL HIGH (ref 150–400)
RBC: 3.46 MIL/uL — ABNORMAL LOW (ref 4.22–5.81)
RDW: 19.2 % — AB (ref 11.5–15.5)
WBC: 8.8 10*3/uL (ref 4.0–10.5)

## 2017-03-18 LAB — URINALYSIS, ROUTINE W REFLEX MICROSCOPIC
BILIRUBIN URINE: NEGATIVE
Bacteria, UA: NONE SEEN
Glucose, UA: NEGATIVE mg/dL
Ketones, ur: NEGATIVE mg/dL
Leukocytes, UA: NEGATIVE
Nitrite: NEGATIVE
Protein, ur: NEGATIVE mg/dL
SPECIFIC GRAVITY, URINE: 1.014 (ref 1.005–1.030)
pH: 6 (ref 5.0–8.0)

## 2017-03-18 LAB — BASIC METABOLIC PANEL
Anion gap: 6 (ref 5–15)
BUN: 14 mg/dL (ref 6–20)
CALCIUM: 8.7 mg/dL — AB (ref 8.9–10.3)
CO2: 26 mmol/L (ref 22–32)
CREATININE: 0.71 mg/dL (ref 0.61–1.24)
Chloride: 105 mmol/L (ref 101–111)
GFR calc Af Amer: >60 mL/min (ref >60)
GFR calc non Af Amer: >60 mL/min (ref >60)
GLUCOSE: 100 mg/dL — AB (ref 65–99)
Potassium: 4.1 mmol/L (ref 3.5–5.1)
Sodium: 137 mmol/L (ref 135–145)

## 2017-03-18 MED ORDER — MORPHINE SULFATE (PF) 4 MG/ML IV SOLN
4.0000 mg | Freq: Once | INTRAVENOUS | Status: AC
  Administered 2017-03-18: 4 mg via INTRAVENOUS
  Filled 2017-03-18: qty 1

## 2017-03-18 MED ORDER — KETOROLAC TROMETHAMINE 30 MG/ML IJ SOLN
15.0000 mg | Freq: Once | INTRAMUSCULAR | Status: AC
  Administered 2017-03-18: 15 mg via INTRAVENOUS
  Filled 2017-03-18: qty 1

## 2017-03-18 MED ORDER — OXYCODONE-ACETAMINOPHEN 5-325 MG PO TABS
1.0000 | ORAL_TABLET | Freq: Four times a day (QID) | ORAL | 0 refills | Status: DC | PRN
Start: 2017-03-18 — End: 2017-04-02

## 2017-03-18 MED ORDER — ONDANSETRON HCL 4 MG/2ML IJ SOLN
4.0000 mg | Freq: Once | INTRAMUSCULAR | Status: AC
  Administered 2017-03-18: 4 mg via INTRAVENOUS
  Filled 2017-03-18: qty 2

## 2017-03-18 MED ORDER — SODIUM CHLORIDE 0.9 % IV BOLUS (SEPSIS)
1000.0000 mL | Freq: Once | INTRAVENOUS | Status: AC
  Administered 2017-03-18: 1000 mL via INTRAVENOUS

## 2017-03-18 NOTE — ED Notes (Signed)
[facility] call to transport the patient.

## 2017-03-18 NOTE — ED Triage Notes (Signed)
Patient coming from home with c/o back pain associated with kidney stones. Pt state he was sent her by his PCP to get kidney stone out. CBG 248. Patient alert and oriented x 4.

## 2017-03-18 NOTE — ED Provider Notes (Signed)
[facility] DEPT Provider Note   CSN: [id number] Arrival date & time: 03/18/17  1118     History   Chief Complaint No chief complaint on file.   HPI [name] is a 76 y.o. male.  HPI 76 year old male who presents with right flank pain.  History of coronary artery disease, chronic diastolic heart failure, paroxysmal atrial fibrillation and diabetes.  Patient was seen December 14 in December 16 for right flank pain.  During first ED course he underwent CT abdomen and pelvis suggestive of a 3 x 4 cm right ureteral stone that was felt to be causing his symptoms.  Patient has been taking occasional hydrocodone at home without improvement in his symptoms.  Was seen again on 16 December for pain control and discharged.  He has not been able to follow-up with urologist.  During this time has also been taking antibiotics for concurrent UTI.  States that he has 1-2 doses left.  Has not had fevers, nausea or vomiting, diarrhea, confusion.  Was told by his PCP to come to the ED again.   Past Medical History:  Diagnosis Date  . Anemia    takes Ferrous Sulfate daily  . Arthritis    "all over"  . Balance problem 01/2014  . CAP (community acquired pneumonia) 09/18/2014  . Cervical radiculopathy due to degenerative joint disease of spine   . COPD (chronic obstructive pulmonary disease) ([place])   . Coronary artery disease, non-occlusive    a. 03/2010 Nonocclusive disease by cath, performed for ST elevations on ECG;  b. 06/2013 Lexi MV: EF 60%, no ischemia.  . Diabetes mellitus type II    takes Metformin and Lantus daily  . Diastolic CHF, chronic ([place])    a. 12/2012 EF 55-60%, diast dysfxn, triv MR, mildly dil LA/RA.  [name] History of blood transfusion 1982   "when I had stomach OR"  . History of bronchitis    1998  . History of gastric ulcer   . HTN (hypertension)    takes Diltiazem daily  . Hyperlipidemia    takes Pravastatin daily  . Joint pain   . PAF  (paroxysmal atrial fibrillation) (HCC)    Recurrent after atrial flutter (a. 07/2010 Status post caval tricuspid isthmus ablation by Dr. Midge Aver Metoprolol daily), currently controlled on flecainide plus diltiazem and Coumadin  . Weakness    numbness and tingling both hands    Patient Active Problem List   Diagnosis Date Noted  . Acute blood loss anemia 10/02/2016  . Abdominal pain 10/02/2016  . Constipation 10/02/2016  . Left lower quadrant pain   . Coagulopathy ([place])   . Heme positive stool   . Symptomatic anemia 07/10/2016  . Syncope 05/15/2015  . Cervical disc disease 05/15/2015  . Abnormal urinalysis 05/15/2015  . Anemia 05/15/2015  . History of CVA (cerebrovascular accident) 04/03/2014  . Near syncope 02/15/2014  . Pre-syncope 02/15/2014  . Benign neoplasm of rectum and anal canal 12/02/2013  . Upper GI bleeding 11/30/2013  . Tobacco use disorder 11/30/2013  . PAF (paroxysmal atrial fibrillation), maintaining SR; CHA2DS2Vasc = 7 08/15/2013  . Anticoagulation goal of INR 2 to 3, for PAF - CHA2DS2Vasc = 7 08/15/2013  . Type 2 diabetes mellitus ([place]) 01/22/2013  . Tobacco abuse counseling 12/15/2012  . Hyperlipidemia   . Obesity (BMI 30-39.9) 12/14/2012  . Atrial flutter - status post CTI ablation 07/29/2010  . Essential hypertension 07/29/2010  . Chronic diastolic congestive heart failure ([place]) 07/29/2010  . Coronary artery  disease, non-occlusive 07/29/2010    Past Surgical History:  Procedure Laterality Date  . ATRIAL ABLATION SURGERY  08/05/10   CTI ablation for atrial flutter by JA  . CARDIAC CATHETERIZATION  2012   nl LV function, no occlusive CAD, PAF  . CARDIOVERSION  12/07/2010    Successful direct current cardioversion with atrial fibrillation to normal sinus rhythm  . CARPAL TUNNEL RELEASE Bilateral 01/30/2014   Procedure: BILATERAL CARPAL TUNNEL RELEASE;  Surgeon: Marianna Payment, MD;  Location: [facility];  Service: Orthopedics;  Laterality: Bilateral;  .  CATARACT EXTRACTION W/ INTRAOCULAR LENS  IMPLANT, BILATERAL Bilateral   . COLONOSCOPY [name] 12/02/2013   Procedure: COLONOSCOPY;  Surgeon: Irene Shipper, MD;  Location: [facility];  Service: Endoscopy;  Laterality: [name];  . ESOPHAGOGASTRODUODENOSCOPY [name] 09/22/2014   Procedure: ESOPHAGOGASTRODUODENOSCOPY (EGD);  Surgeon: Ronald Lobo, MD;  Location: [facility] ENDOSCOPY;  Service: Endoscopy;  Laterality: [name];  . ESOPHAGOGASTRODUODENOSCOPY [name] 07/11/2016   Procedure: ESOPHAGOGASTRODUODENOSCOPY (EGD);  Surgeon: Doran Stabler, MD;  Location: [facility] ENDOSCOPY;  Service: Endoscopy;  Laterality: [name];  . INCISION AND DRAINAGE ABSCESS / HEMATOMA OF BURSA / KNEE / THIGH Left 1998   knee  . KNEE BURSECTOMY Left 1998  . LAPAROSCOPIC CHOLECYSTECTOMY  03/2010  . NM MYOVIEW LTD  07/22/2013   Normal EF ~60%, no ischemia or infarction.  [name] PARTIAL GASTRECTOMY  1982   subtotal; "took out 30% for ulcers"  . TRANSTHORACIC ECHOCARDIOGRAM  02/16/2014   EF 60%, no RWMA. - otherwise normal  . YAG LASER APPLICATION Bilateral        Home Medications    Prior to Admission medications   Medication Sig Start Date End Date Taking? Authorizing Provider  aspirin EC 81 MG tablet Take 81 mg by mouth daily.   Yes [provider]  B-D ULTRAFINE III SHORT PEN 31G X 8 MM MISC  01/03/17  Yes [provider]  co-enzyme Q-10 30 MG capsule Take 30 mg by mouth daily.    Yes [provider]  diltiazem (CARDIZEM CD) 120 MG 24 hr capsule Take 1 capsule (120 mg total) by mouth daily. 10/25/16  Yes Strader, Fransisco Hertz, PA-C  flecainide (TAMBOCOR) 150 MG tablet Take 1.5 tablets (225 mg total) by mouth 2 (two) times daily. 10/25/16  Yes Strader, [place] M, PA-C  hydrocortisone cream 1 % Apply 1 application topically 2 (two) times daily.  01/02/17  Yes [provider]  Insulin Glargine (LANTUS) 100 UNIT/ML Solostar Pen Inject 8 Units into the skin 2 (two) times daily. 11/30/16  Yes Domenic Polite, MD  loratadine  (CLARITIN) 10 MG tablet Take 10 mg by mouth every evening.    Yes [provider]  metFORMIN (GLUCOPHAGE) 1000 MG tablet Take 1,000 mg by mouth 2 (two) times daily with a meal.    Yes [provider]  metoprolol succinate (TOPROL-XL) 25 MG 24 hr tablet TAKE 1/2 TABLET  (12.5 MG TOTAL) BY MOUTH DAILY. 10/25/16  Yes Strader, [place] M, PA-C  Multiple Vitamin (MULTIVITAMIN WITH MINERALS) TABS tablet Take 1 tablet by mouth daily.    Yes [provider]  nitroGLYCERIN (NITROSTAT) 0.4 MG SL tablet Place 1 tablet (0.4 mg total) under the tongue every 5 (five) minutes x 3 doses as needed for chest pain. 11/19/15  Yes Kilroy, Luke K, PA-C  oxybutynin (DITROPAN-XL) 5 MG 24 hr tablet Take 5 mg by mouth daily. 11/02/16 11/02/17 Yes [provider]  pantoprazole (PROTONIX) 40 MG tablet Take 1 tablet (40 mg  total) by mouth 2 (two) times daily before a meal. 11/30/16  Yes Domenic Polite, MD  polyethylene glycol [facility] / GLYCOLAX) packet Take 17 g by mouth 2 (two) times daily. Patient taking differently: Take 17 g by mouth daily as needed for moderate constipation.  10/03/16  Yes Sheikh, [place], DO  pravastatin (PRAVACHOL) 40 MG tablet TAKE 1 TABLET EVERY EVENING Patient taking differently: TAKE 40 MG BY MOUTH EVERY EVENING 09/22/16  Yes Leonie Man, MD  tamsulosin (FLOMAX) 0.4 MG CAPS capsule Take 0.4 mg by mouth daily. 02/20/17  Yes [provider]  gabapentin (NEURONTIN) 300 MG capsule Take 1 capsule (300 mg total) by mouth 3 (three) times daily. Take 1 tablet once a day for 3 days then 1 tablet twice a day for 3 days then 1 tablet 3 times a day. Patient not taking: Reported on 03/18/2017 01/22/17   Ward, Delice Bison, DO  ondansetron (ZOFRAN ODT) 4 MG disintegrating tablet Take 1 tablet (4 mg total) by mouth every 8 (eight) hours as needed for nausea or vomiting. Patient not taking: Reported on 03/18/2017 03/09/17   Duffy Bruce, MD  oxyCODONE (ROXICODONE) 5 MG  immediate release tablet Take 1 tablet (5 mg total) by mouth every 4 (four) hours as needed for severe pain. Patient not taking: Reported on 03/18/2017 03/09/17   Duffy Bruce, MD  oxyCODONE-acetaminophen (PERCOCET/ROXICET) 5-325 MG tablet Take 1-2 tablets by mouth every 6 (six) hours as needed for severe pain. 03/18/17   Forde Dandy, MD  senna-docusate (SENOKOT-S) 8.6-50 MG tablet Take 1 tablet by mouth 2 (two) times daily. Patient not taking: Reported on 01/05/2017 10/03/16   Kerney Elbe, DO    Family History Family History  Problem Relation Age of Onset  . Cancer Mother   . Heart attack Father   . Heart attack Brother     Social History Social History   Tobacco Use  . Smoking status: Current Every Day Smoker    Packs/day: 0.50    Years: 55.00    Pack years: 27.50    Types: Cigarettes  . Smokeless tobacco: Never Used  . Tobacco comment: He's been smoking between 0.5 and 2 ppd since age 1.  Substance Use Topics  . Alcohol use: No    Alcohol/week: 0.0 oz    Comment: "quit drinking in 1986"  . Drug use: No     Allergies   Cyclobenzaprine   Review of Systems Review of Systems  Constitutional: Negative for fever.  Gastrointestinal: Positive for abdominal pain. Negative for diarrhea, nausea and vomiting.  Genitourinary: Positive for frequency. Negative for dysuria.  All other systems reviewed and are negative.    Physical Exam Updated Vital Signs BP (!) 108/49   Pulse 77   Resp 19   SpO2 99%   Physical Exam  Physical Exam  Nursing note and vitals reviewed. Constitutional: Well developed, well nourished, non-toxic, and in no acute distress Head: Normocephalic and atraumatic.  Mouth/Throat: Oropharynx is clear and moist.  Neck: Normal range of motion. Neck supple.  Cardiovascular: Normal rate and regular rhythm.   Pulmonary/Chest: Effort normal and breath sounds normal.  Abdominal: Soft. There is no tenderness. There is no rebound and no  guarding. Right CVA tenderness to percussion.   Musculoskeletal: Normal range of motion.  Neurological: Alert, no facial droop, fluent speech, moves all extremities symmetrically Skin: Skin is warm and dry.  Psychiatric: Cooperative  ED Treatments / Results  Labs (all labs ordered are listed, but only  abnormal results are displayed) Labs Reviewed  CBC WITH DIFFERENTIAL/PLATELET - Abnormal; Notable for the following components:      Result Value   RBC 3.46 (*)    Hemoglobin 8.1 (*)    HCT 27.1 (*)    MCH 23.4 (*)    MCHC 29.9 (*)    RDW 19.2 (*)    Platelets 415 (*)    All other components within normal limits  BASIC METABOLIC PANEL - Abnormal; Notable for the following components:   Glucose, Bld 100 (*)    Calcium 8.7 (*)    All other components within normal limits  URINALYSIS, ROUTINE W REFLEX MICROSCOPIC - Abnormal; Notable for the following components:   Hgb urine dipstick MODERATE (*)    Squamous Epithelial / LPF 0-5 (*)    All other components within normal limits  URINE CULTURE    EKG  EKG Interpretation None       Radiology Dg Abdomen 1 View  Result Date: 03/18/2017 CLINICAL DATA:  76 year old male with right flank pain EXAM: ABDOMEN - 1 VIEW COMPARISON:  Recent CT scan of the abdomen and pelvis 03/09/2017 FINDINGS: Radiopacity overlying the lower pole of the right renal shadow measures approximately 6 mm consistent with known lower pole nephrolithiasis. Similarly, there is a small 4 mm radiopacity adjacent to the spine at the level of L4-L5 consistent with the known right ureteral stone. No other stones identified. Surgical clips are in the right upper quadrant consistent with prior cholecystectomy. The bowel gas pattern is not obstructed. Lung bases are clear. Multilevel degenerative disc disease throughout the spine most severe at L4-L5. IMPRESSION: 1. Right lower pole and proximal ureteral stones as seen on prior CT imaging. 2. Multilevel degenerative disc  disease most notable at L4-L5. Electronically Signed   By: Jacqulynn Cadet M.D.   On: 03/18/2017 13:06    Procedures Procedures (including critical care time)  Medications Ordered in ED Medications  sodium chloride 0.9 % bolus 1,000 mL (0 mLs Intravenous Stopped 03/18/17 1450)  morphine 4 MG/ML injection 4 mg (4 mg Intravenous Given 03/18/17 1238)  ondansetron (ZOFRAN) injection 4 mg (4 mg Intravenous Given 03/18/17 1238)  ketorolac (TORADOL) 30 MG/ML injection 15 mg (15 mg Intravenous Given 03/18/17 1238)     Initial Impression / Assessment and Plan / ED Course  I have reviewed the triage vital signs and the nursing notes.  Pertinent labs & imaging results that were available during my care of the patient were reviewed by me and considered in my medical decision making (see chart for details).     76 year old male who presents with right flank pain in the setting of right ureteral stone.  Hemodynamically stable but does appear uncomfortable secondary to pain.  Right CVA tenderness is noted, but he does have soft and benign abdomen.  Blood work overall reassuring.  He does have a stable anemia.  Renal function is stable.  UA at this time is not suggestive of infection.  He is given single dose of morphine, small dose of Toradol and IV fluids.  He has full relief of his pain.  Suspect that he may not be taking medications frequently enough for analgesia at home.  Patient will continue pain management from home.  Referral to urology is also provided. Strict return and follow-up instructions reviewed. He expressed understanding of all discharge instructions and felt comfortable with the plan of care.   Final Clinical Impressions(s) / ED Diagnoses   Final diagnoses:  Right ureteral stone  ED Discharge Orders        Ordered    oxyCODONE-acetaminophen (PERCOCET/ROXICET) 5-325 MG tablet  Every 6 hours PRN     03/18/17 1431       Forde Dandy, MD 03/18/17 [phone]

## 2017-03-18 NOTE — Discharge Instructions (Signed)
Please call urology for close follow-up.  Stop taking hydrocodone (Norco) and start taking percocet, as prescribed.  Return without fail for worsening symptoms, including fever, intractable vomiting, escalating pain, or any other symptoms concerning to you.

## 2017-03-18 NOTE — ED Notes (Signed)
Bed: WA03 Expected date:  Expected time:  Means of arrival:  Comments: 76 yo kidney stone

## 2017-03-18 NOTE — ED Notes (Signed)
Patient transported to X-ray 

## 2017-03-20 LAB — URINE CULTURE: Culture: >=100000 — AB

## 2017-03-21 ENCOUNTER — Telehealth: Payer: Self-pay | Admitting: *Deleted

## 2017-03-21 NOTE — Progress Notes (Signed)
ED Antimicrobial Stewardship Positive Culture Follow Up   [name] is an 76 y.o. male who presented to [facility] on 03/18/2017 with a chief complaint of No chief complaint on file.   Recent Results (from the past 720 hour(s))  Urine culture     Status: Abnormal   Collection Time: 03/09/17  4:13 PM  Result Value Ref Range Status   Specimen Description URINE, CLEAN CATCH  Final   Special Requests NONE  Final   Culture >=100,000 COLONIES/mL MORGANELLA MORGANII (A)  Final   Report Status 03/11/2017 FINAL  Final   Organism ID, Bacteria MORGANELLA MORGANII (A)  Final      Susceptibility   Morganella morganii - MIC*    AMPICILLIN >=32 RESISTANT Resistant     CEFAZOLIN >=64 RESISTANT Resistant     CEFTRIAXONE <=1 SENSITIVE Sensitive     CIPROFLOXACIN <=0.25 SENSITIVE Sensitive     GENTAMICIN <=1 SENSITIVE Sensitive     IMIPENEM 4 SENSITIVE Sensitive     NITROFURANTOIN 128 RESISTANT Resistant     TRIMETH/SULFA <=20 SENSITIVE Sensitive     AMPICILLIN/SULBACTAM 8 SENSITIVE Sensitive     PIP/TAZO <=4 SENSITIVE Sensitive     * >=100,000 COLONIES/mL MORGANELLA MORGANII  Blood culture (routine x 2)     Status: None   Collection Time: 03/09/17  4:13 PM  Result Value Ref Range Status   Specimen Description BLOOD LEFT HAND  Final   Special Requests   Final    BOTTLES DRAWN AEROBIC AND ANAEROBIC Blood Culture adequate volume   Culture   Final    NO GROWTH 5 DAYS Performed at [facility], [address] [place], [place] [address]    Report Status 03/14/2017 FINAL  Final  Blood culture (routine x 2)     Status: Abnormal   Collection Time: 03/09/17  4:18 PM  Result Value Ref Range Status   Specimen Description BLOOD RIGHT ANTECUBITAL  Final   Special Requests   Final    BOTTLES DRAWN AEROBIC AND ANAEROBIC Blood Culture adequate volume   Culture  Setup Time   Final    GRAM POSITIVE COCCI IN CLUSTERS AEROBIC BOTTLE ONLY CRITICAL RESULT CALLED TO, READ BACK BY AND VERIFIED  WITH: S GOUGE,RN AT 6045 03/10/17 BY L BENFIELD    Culture (A)  Final    STAPHYLOCOCCUS SPECIES (COAGULASE NEGATIVE) THE SIGNIFICANCE OF ISOLATING THIS ORGANISM FROM A SINGLE SET OF BLOOD CULTURES WHEN MULTIPLE SETS ARE DRAWN IS UNCERTAIN. PLEASE NOTIFY THE MICROBIOLOGY DEPARTMENT WITHIN ONE WEEK IF SPECIATION AND SENSITIVITIES ARE REQUIRED. Performed at [facility], [place] [address], [place], [place] [address]    Report Status 03/12/2017 FINAL  Final  Blood Culture ID Panel (Reflexed)     Status: Abnormal   Collection Time: 03/09/17  4:18 PM  Result Value Ref Range Status   Enterococcus species NOT DETECTED NOT DETECTED Final   Listeria monocytogenes NOT DETECTED NOT DETECTED Final   Staphylococcus species DETECTED (A) NOT DETECTED Final    Comment: Methicillin (oxacillin) resistant coagulase negative staphylococcus. Possible blood culture contaminant (unless isolated from more than one blood culture draw or clinical case suggests pathogenicity). No antibiotic treatment is indicated for blood  culture contaminants. CRITICAL RESULT CALLED TO, READ BACK BY AND VERIFIED WITH: S GOUGE,RN AT 1753 03/10/17 BY L BENFIELD    Staphylococcus aureus NOT DETECTED NOT DETECTED Final   Methicillin resistance DETECTED (A) NOT DETECTED Final    Comment: CRITICAL RESULT CALLED TO, READ BACK BY  AND VERIFIED WITH: S GOUGE,RN AT 1753 03/10/17 BY L BENFIELD    Streptococcus species NOT DETECTED NOT DETECTED Final   Streptococcus agalactiae NOT DETECTED NOT DETECTED Final   Streptococcus pneumoniae NOT DETECTED NOT DETECTED Final   Streptococcus pyogenes NOT DETECTED NOT DETECTED Final   Acinetobacter baumannii NOT DETECTED NOT DETECTED Final   Enterobacteriaceae species NOT DETECTED NOT DETECTED Final   Enterobacter cloacae complex NOT DETECTED NOT DETECTED Final   Escherichia coli NOT DETECTED NOT DETECTED Final   Klebsiella oxytoca NOT DETECTED NOT DETECTED Final   Klebsiella pneumoniae NOT  DETECTED NOT DETECTED Final   Proteus species NOT DETECTED NOT DETECTED Final   Serratia marcescens NOT DETECTED NOT DETECTED Final   Haemophilus influenzae NOT DETECTED NOT DETECTED Final   Neisseria meningitidis NOT DETECTED NOT DETECTED Final   Pseudomonas aeruginosa NOT DETECTED NOT DETECTED Final   Candida albicans NOT DETECTED NOT DETECTED Final   Candida glabrata NOT DETECTED NOT DETECTED Final   Candida krusei NOT DETECTED NOT DETECTED Final   Candida parapsilosis NOT DETECTED NOT DETECTED Final   Candida tropicalis NOT DETECTED NOT DETECTED Final    Comment: Performed at [facility], [place] [address], [place], [place] [address]  Urine culture     Status: Abnormal   Collection Time: 03/18/17 12:37 PM  Result Value Ref Range Status   Specimen Description URINE, CLEAN CATCH  Final   Special Requests NONE  Final   Culture >=100,000 COLONIES/mL MORGANELLA MORGANII (A)  Final   Report Status 03/20/2017 FINAL  Final   Organism ID, Bacteria MORGANELLA MORGANII (A)  Final      Susceptibility   Morganella morganii - MIC*    AMPICILLIN >=32 RESISTANT Resistant     CEFAZOLIN >=64 RESISTANT Resistant     CEFTRIAXONE <=1 SENSITIVE Sensitive     CIPROFLOXACIN <=0.25 SENSITIVE Sensitive     GENTAMICIN <=1 SENSITIVE Sensitive     IMIPENEM 1 SENSITIVE Sensitive     NITROFURANTOIN 128 RESISTANT Resistant     TRIMETH/SULFA <=20 SENSITIVE Sensitive     AMPICILLIN/SULBACTAM 16 INTERMEDIATE Intermediate     PIP/TAZO <=4 SENSITIVE Sensitive     * >=100,000 COLONIES/mL MORGANELLA MORGANII    New antibiotic prescription: Bactrim 1 DS tablet PO BID x 7 days  ED Provider: Delia Heady PA-C   Reginia Naas 03/21/2017, 9:57 AM Infectious Diseases Pharmacist Phone# [phone]

## 2017-03-21 NOTE — Telephone Encounter (Signed)
Post ED Visit - Positive Culture Follow-up: Successful Patient Follow-Up  Culture assessed and recommendations reviewed by: [x]  Elenor Quinones, Pharm.D. []  Heide Guile, Pharm.D., BCPS AQ-ID []  Parks Neptune, Pharm.D., BCPS []  Alycia Rossetti, Pharm.D., BCPS []  [place], Pharm.D., BCPS, AAHIVP []  Legrand Como, Pharm.D., BCPS, AAHIVP []  Salome Arnt, PharmD, BCPS []  Dimitri Ped, PharmD, BCPS []  Vincenza Hews, PharmD, BCPS  Positive urine culture  [x]  Patient discharged without antimicrobial prescription and treatment is now indicated []  Organism is resistant to prescribed ED discharge antimicrobial []  Patient with positive blood cultures  Changes discussed with ED provider:Hina Khatripaz New antibiotic prescription Bactrim 1 PO BID x 7 days Called to [facility], [employer] [phone]  Contacted patient, date 03/21/2017, time [place], [place] 03/21/2017, 10:31 AM

## 2017-03-23 DIAGNOSIS — N200 Calculus of kidney: Secondary | ICD-10-CM | POA: Diagnosis not present

## 2017-03-23 DIAGNOSIS — N202 Calculus of kidney with calculus of ureter: Secondary | ICD-10-CM | POA: Diagnosis not present

## 2017-03-31 ENCOUNTER — Other Ambulatory Visit: Payer: Self-pay | Admitting: Cardiology

## 2017-04-01 ENCOUNTER — Encounter (HOSPITAL_COMMUNITY): Payer: Self-pay | Admitting: Nurse Practitioner

## 2017-04-01 DIAGNOSIS — M25551 Pain in right hip: Secondary | ICD-10-CM | POA: Insufficient documentation

## 2017-04-01 DIAGNOSIS — I4891 Unspecified atrial fibrillation: Secondary | ICD-10-CM | POA: Insufficient documentation

## 2017-04-01 DIAGNOSIS — J449 Chronic obstructive pulmonary disease, unspecified: Secondary | ICD-10-CM | POA: Diagnosis not present

## 2017-04-01 DIAGNOSIS — F17210 Nicotine dependence, cigarettes, uncomplicated: Secondary | ICD-10-CM | POA: Diagnosis not present

## 2017-04-01 DIAGNOSIS — R1031 Right lower quadrant pain: Secondary | ICD-10-CM | POA: Insufficient documentation

## 2017-04-01 DIAGNOSIS — D649 Anemia, unspecified: Secondary | ICD-10-CM | POA: Insufficient documentation

## 2017-04-01 DIAGNOSIS — I110 Hypertensive heart disease with heart failure: Secondary | ICD-10-CM | POA: Insufficient documentation

## 2017-04-01 DIAGNOSIS — N201 Calculus of ureter: Secondary | ICD-10-CM | POA: Insufficient documentation

## 2017-04-01 DIAGNOSIS — Z79899 Other long term (current) drug therapy: Secondary | ICD-10-CM | POA: Insufficient documentation

## 2017-04-01 DIAGNOSIS — Z794 Long term (current) use of insulin: Secondary | ICD-10-CM | POA: Insufficient documentation

## 2017-04-01 DIAGNOSIS — I5032 Chronic diastolic (congestive) heart failure: Secondary | ICD-10-CM | POA: Diagnosis not present

## 2017-04-01 DIAGNOSIS — E119 Type 2 diabetes mellitus without complications: Secondary | ICD-10-CM | POA: Insufficient documentation

## 2017-04-01 DIAGNOSIS — I2510 Atherosclerotic heart disease of native coronary artery without angina pectoris: Secondary | ICD-10-CM | POA: Diagnosis not present

## 2017-04-01 DIAGNOSIS — T148XXA Other injury of unspecified body region, initial encounter: Secondary | ICD-10-CM | POA: Diagnosis not present

## 2017-04-01 NOTE — ED Triage Notes (Signed)
Pt is c/o right hip pain and states his kidney problems have not improved.

## 2017-04-02 ENCOUNTER — Emergency Department (HOSPITAL_COMMUNITY)
Admission: EM | Admit: 2017-04-02 | Discharge: 2017-04-02 | Disposition: A | Discharge status: Home / Self Care | Payer: Medicare Other | Attending: Emergency Medicine | Admitting: Emergency Medicine

## 2017-04-02 ENCOUNTER — Emergency Department (HOSPITAL_COMMUNITY): Payer: Medicare Other

## 2017-04-02 DIAGNOSIS — M25551 Pain in right hip: Secondary | ICD-10-CM | POA: Diagnosis not present

## 2017-04-02 DIAGNOSIS — G8911 Acute pain due to trauma: Secondary | ICD-10-CM | POA: Diagnosis not present

## 2017-04-02 DIAGNOSIS — S79911A Unspecified injury of right hip, initial encounter: Secondary | ICD-10-CM | POA: Diagnosis not present

## 2017-04-02 DIAGNOSIS — N201 Calculus of ureter: Secondary | ICD-10-CM

## 2017-04-02 LAB — URINALYSIS, ROUTINE W REFLEX MICROSCOPIC
Bilirubin Urine: NEGATIVE
Glucose, UA: NEGATIVE mg/dL
Ketones, ur: NEGATIVE mg/dL
NITRITE: NEGATIVE
PH: 6 (ref 5.0–8.0)
Protein, ur: NEGATIVE mg/dL
SPECIFIC GRAVITY, URINE: 1.015 (ref 1.005–1.030)

## 2017-04-02 MED ORDER — OXYCODONE-ACETAMINOPHEN 5-325 MG PO TABS: 1.0000 | ORAL_TABLET | ORAL | 0 refills | Status: DC | PRN

## 2017-04-02 MED ORDER — OXYCODONE-ACETAMINOPHEN 5-325 MG PO TABS
1.0000 | ORAL_TABLET | Freq: Once | ORAL | Status: AC
  Administered 2017-04-02: 1 via ORAL
  Filled 2017-04-02: qty 1

## 2017-04-02 NOTE — ED Notes (Signed)
Pt in hurry unable to sign for discharge instructions.

## 2017-04-02 NOTE — ED Notes (Signed)
Pt urinated prior to being placed in the hall bed. Will try later.

## 2017-04-02 NOTE — ED Notes (Signed)
Patient transported to X-ray 

## 2017-04-02 NOTE — ED Provider Notes (Addendum)
[facility] DEPT Provider Note: Georgena Spurling, MD, FACEP  CSN: [id number] MRN: [id number] ARRIVAL: 04/01/17 at [place]: [place]  Hip Pain   HISTORY OF PRESENT ILLNESS  04/02/17 1:18 AM [name] is a 77 y.o. male with a recently diagnosed right ureteral stone.  He was also recently diagnosed with degenerative disc disease after complaining of right hip pain.  He is on 90 hydrocodone tablets monthly for chronic pain.    He is here with an exacerbation of his right hip pain which occurred 3 days ago after twisting his lower back.  He states he did not actually fall.  The pain originates in his right lower back but is felt primarily in the right hip.  He rates the pain as a 10 out of 10, worse with attempted weightbearing or with movement.  He states he is unable to bear weight on the right leg.  He also complains of persistent pain which he attributes to the kidney stone.  He feels this pain in his right lower quadrant and suprapubic regions.  He describes the pain as a burning pain.  He states he recently was treated with an antibiotic for an infection by his urologist.  He has a follow-up appointment with his urologist tomorrow.   Past Medical History:  Diagnosis Date  . Anemia    takes Ferrous Sulfate daily  . Arthritis    "all over"  . Balance problem 01/2014  . CAP (community acquired pneumonia) 09/18/2014  . Cervical radiculopathy due to degenerative joint disease of spine   . COPD (chronic obstructive pulmonary disease) ([facility])   . Coronary artery disease, non-occlusive    a. 03/2010 Nonocclusive disease by cath, performed for ST elevations on ECG;  b. 06/2013 Lexi MV: EF 60%, no ischemia.  . Diabetes mellitus type II    takes Metformin and Lantus daily  . Diastolic CHF, chronic ([place])    a. 12/2012 EF 55-60%, diast dysfxn, triv MR, mildly dil LA/RA.  [name] History of blood transfusion 1982   "when I had stomach OR"  . History of bronchitis    1998    . History of gastric ulcer   . HTN (hypertension)    takes Diltiazem daily  . Hyperlipidemia    takes Pravastatin daily  . Joint pain   . PAF (paroxysmal atrial fibrillation) ([facility])    Recurrent after atrial flutter (a. 07/2010 Status post caval tricuspid isthmus ablation by Dr. Midge Aver Metoprolol daily), currently controlled on flecainide plus diltiazem and Coumadin  . Weakness    numbness and tingling both hands    Past Surgical History:  Procedure Laterality Date  . ATRIAL ABLATION SURGERY  08/05/10   CTI ablation for atrial flutter by JA  . CARDIAC CATHETERIZATION  2012   nl LV function, no occlusive CAD, PAF  . CARDIOVERSION  12/07/2010    Successful direct current cardioversion with atrial fibrillation to normal sinus rhythm  . CARPAL TUNNEL RELEASE Bilateral 01/30/2014   Procedure: BILATERAL CARPAL TUNNEL RELEASE;  Surgeon: Marianna Payment, MD;  Location: [facility];  Service: Orthopedics;  Laterality: Bilateral;  . CATARACT EXTRACTION W/ INTRAOCULAR LENS  IMPLANT, BILATERAL Bilateral   . COLONOSCOPY [name] 12/02/2013   Procedure: COLONOSCOPY;  Surgeon: Irene Shipper, MD;  Location: [facility];  Service: Endoscopy;  Laterality: [name];  . ESOPHAGOGASTRODUODENOSCOPY [name] 09/22/2014   Procedure: ESOPHAGOGASTRODUODENOSCOPY (EGD);  Surgeon: Ronald Lobo, MD;  Location: [facility] ENDOSCOPY;  Service: Endoscopy;  Laterality: [name];  .  ESOPHAGOGASTRODUODENOSCOPY [name] 07/11/2016   Procedure: ESOPHAGOGASTRODUODENOSCOPY (EGD);  Surgeon: Doran Stabler, MD;  Location: [facility] ENDOSCOPY;  Service: Endoscopy;  Laterality: [name];  . INCISION AND DRAINAGE ABSCESS / HEMATOMA OF BURSA / KNEE / THIGH Left 1998   knee  . KNEE BURSECTOMY Left 1998  . LAPAROSCOPIC CHOLECYSTECTOMY  03/2010  . NM MYOVIEW LTD  07/22/2013   Normal EF ~60%, no ischemia or infarction.  [name] PARTIAL GASTRECTOMY  1982   subtotal; "took out 30% for ulcers"  . TRANSTHORACIC ECHOCARDIOGRAM  02/16/2014   EF 60%, no RWMA. - otherwise normal  .  YAG LASER APPLICATION Bilateral     Family History  Problem Relation Age of Onset  . Cancer Mother   . Heart attack Father   . Heart attack Brother     Social History   Tobacco Use  . Smoking status: Current Every Day Smoker    Packs/day: 0.50    Years: 55.00    Pack years: 27.50    Types: Cigarettes  . Smokeless tobacco: Never Used  . Tobacco comment: He's been smoking between 0.5 and 2 ppd since age 17.  Substance Use Topics  . Alcohol use: No    Alcohol/week: 0.0 oz    Comment: "quit drinking in 1986"  . Drug use: No    Prior to Admission medications   Medication Sig Start Date End Date Taking? Authorizing Provider  aspirin EC 81 MG tablet Take 81 mg by mouth daily.    [provider]  B-D ULTRAFINE III SHORT PEN 31G X 8 MM MISC  01/03/17   [provider]  co-enzyme Q-10 30 MG capsule Take 30 mg by mouth daily.     [provider]  diltiazem (CARDIZEM CD) 120 MG 24 hr capsule Take 1 capsule (120 mg total) by mouth daily. 10/25/16   Strader, Fransisco Hertz, PA-C  flecainide (TAMBOCOR) 150 MG tablet Take 1.5 tablets (225 mg total) by mouth 2 (two) times daily. 10/25/16   Strader, Fransisco Hertz, PA-C  hydrocortisone cream 1 % Apply 1 application topically 2 (two) times daily.  01/02/17   [provider]  Insulin Glargine (LANTUS) 100 UNIT/ML Solostar Pen Inject 8 Units into the skin 2 (two) times daily. 11/30/16   Domenic Polite, MD  loratadine (CLARITIN) 10 MG tablet Take 10 mg by mouth every evening.     [provider]  metFORMIN (GLUCOPHAGE) 1000 MG tablet Take 1,000 mg by mouth 2 (two) times daily with a meal.     [provider]  metoprolol succinate (TOPROL-XL) 25 MG 24 hr tablet TAKE 1/2 TABLET  (12.5 MG TOTAL) BY MOUTH DAILY. 10/25/16   Strader, Fransisco Hertz, PA-C  Multiple Vitamin (MULTIVITAMIN WITH MINERALS) TABS tablet Take 1 tablet by mouth daily.     [provider]  nitroGLYCERIN (NITROSTAT) 0.4 MG SL tablet Place 1  tablet (0.4 mg total) under the tongue every 5 (five) minutes x 3 doses as needed for chest pain. 11/19/15   Erlene Quan, PA-C  oxybutynin (DITROPAN-XL) 5 MG 24 hr tablet Take 5 mg by mouth daily. 11/02/16 11/02/17  [provider]  oxyCODONE-acetaminophen (PERCOCET/ROXICET) 5-325 MG tablet Take 1 tablet by mouth every 4 (four) hours as needed for severe pain. 04/02/17   Zachrey Deutscher, Jenny Reichmann, MD  pantoprazole (PROTONIX) 40 MG tablet Take 1 tablet (40 mg total) by mouth 2 (two) times daily before a meal. 11/30/16   Domenic Polite, MD  polyethylene glycol [facility] / Floria Raveling) packet Take  17 g by mouth 2 (two) times daily. Patient taking differently: Take 17 g by mouth daily as needed for moderate constipation.  10/03/16   Sheikh, Omair Latif, DO  pravastatin (PRAVACHOL) 40 MG tablet TAKE 1 TABLET EVERY EVENING Patient taking differently: TAKE 40 MG BY MOUTH EVERY EVENING 09/22/16   Leonie Man, MD  tamsulosin (FLOMAX) 0.4 MG CAPS capsule Take 0.4 mg by mouth daily. 02/20/17   [provider]    Allergies Cyclobenzaprine   REVIEW OF SYSTEMS  Negative except as noted here or in the History of Present Illness.   PHYSICAL EXAMINATION  Initial Vital Signs Blood pressure 127/64, pulse 86, temperature 97.8 F (36.6 C), temperature source Oral, resp. rate 20, SpO2 99 %.  Examination General: Well-developed, well-nourished male in no acute distress; appearance consistent with age of record HENT: normocephalic; atraumatic Eyes: pupils equal, round and reactive to light; extraocular muscles intact Neck: supple Heart: regular rate and rhythm Lungs: clear to auscultation bilaterally Abdomen: soft; nondistended; suprapubic tenderness; bowel sounds present Extremities: No deformity; decreased range of motion right hip with pain on passive range of motion of right hip; pulses normal Back: Right paralumbar tenderness Neurologic: Awake, alert and oriented; motor function intact in all  extremities and symmetric; no facial droop Skin: Warm and dry Psychiatric: Normal mood and affect   RESULTS  Summary of this visit's results, reviewed by myself:   EKG Interpretation  Date/Time:    Ventricular Rate:    PR Interval:    QRS Duration:   QT Interval:    QTC Calculation:   R Axis:     Text Interpretation:        Laboratory Studies: Results for orders placed or performed during the hospital encounter of 04/02/17 (from the past 24 hour(s))  Urinalysis, Routine w reflex microscopic     Status: Abnormal   Collection Time: 04/02/17  1:33 AM  Result Value Ref Range   Color, Urine YELLOW YELLOW   APPearance CLEAR CLEAR   Specific Gravity, Urine 1.015 1.005 - 1.030   pH 6.0 5.0 - 8.0   Glucose, UA NEGATIVE NEGATIVE mg/dL   Hgb urine dipstick SMALL (A) NEGATIVE   Bilirubin Urine NEGATIVE NEGATIVE   Ketones, ur NEGATIVE NEGATIVE mg/dL   Protein, ur NEGATIVE NEGATIVE mg/dL   Nitrite NEGATIVE NEGATIVE   Leukocytes, UA TRACE (A) NEGATIVE   RBC / HPF 6-30 0 - 5 RBC/hpf   WBC, UA 0-5 0 - 5 WBC/hpf   Bacteria, UA RARE (A) NONE SEEN   Squamous Epithelial / LPF 0-5 (A) NONE SEEN   Mucus PRESENT    Imaging Studies: Dg Hip Unilat W Or Wo Pelvis 2-3 Views Right  Result Date: 04/02/2017 CLINICAL DATA:  Right hip pain for 3 days. EXAM: DG HIP (WITH OR WITHOUT PELVIS) 2-3V RIGHT COMPARISON:  Radiographs 03/06/2017 abdominal CT 03/19/2017 FINDINGS: The cortical margins of the bony pelvis and right hip are intact. No fracture. Pubic symphysis and sacroiliac joints are congruent. Both femoral heads are well-seated in the respective acetabula. Age related acetabular spurring. IMPRESSION: Mild age-related osteoarthritis.  No acute osseous abnormality. Electronically Signed   By: Jeb Levering M.D.   On: 04/02/2017 02:33    ED COURSE  Nursing notes and initial vitals signs, including pulse oximetry, reviewed.  Vitals:   04/01/17 1746 04/01/17 2314 04/02/17 0131 04/02/17 0310    BP: (!) 115/54 127/64 (!) 135/56 (!) 115/52  Pulse: 87 86 87 81  Resp: 20 20 16  20  Temp: 97.8 F (36.6 C)   97.6 F (36.4 C)  TempSrc: Oral   Oral  SpO2: 98% 99% 98% 100%   3:08 AM Patient advised of x-ray findings.  I suspect his hip pain is radicular in nature although there may be an arthritic component.  His blood in his urine suggest he still has the ureteral stone.  He has an appointment with urology tomorrow morning at 9 AM.  We will provide something stronger than hydrocodone for the next 24 hours pending his urology follow-up.  PROCEDURES    ED DIAGNOSES     ICD-10-CM   1. Right hip pain M25.551   2. Ureterolithiasis N20.1        Mcarthur Ivins, MD 04/02/17 0309    Shanon Rosser, MD 04/02/17 [phone]

## 2017-04-02 NOTE — ED Notes (Signed)
No respiratory or acute distress noted alert and oriented x 3 no reaction to medication noted. 

## 2017-04-03 DIAGNOSIS — N202 Calculus of kidney with calculus of ureter: Secondary | ICD-10-CM | POA: Diagnosis not present

## 2017-04-09 ENCOUNTER — Ambulatory Visit: Payer: Medicare Other | Admitting: Family

## 2017-04-09 ENCOUNTER — Other Ambulatory Visit: Payer: Medicare Other

## 2017-05-04 ENCOUNTER — Ambulatory Visit (INDEPENDENT_AMBULATORY_CARE_PROVIDER_SITE_OTHER): Payer: Medicare Other | Admitting: Cardiology

## 2017-05-04 ENCOUNTER — Encounter: Payer: Self-pay | Admitting: Cardiology

## 2017-05-04 VITALS — BP 110/50 | HR 85 | Ht 66.0 in | Wt 171.0 lb

## 2017-05-04 DIAGNOSIS — I4892 Unspecified atrial flutter: Secondary | ICD-10-CM

## 2017-05-04 DIAGNOSIS — I2510 Atherosclerotic heart disease of native coronary artery without angina pectoris: Secondary | ICD-10-CM | POA: Diagnosis not present

## 2017-05-04 DIAGNOSIS — I5032 Chronic diastolic (congestive) heart failure: Secondary | ICD-10-CM | POA: Diagnosis not present

## 2017-05-04 DIAGNOSIS — I480 Paroxysmal atrial fibrillation: Secondary | ICD-10-CM | POA: Diagnosis not present

## 2017-05-04 DIAGNOSIS — D500 Iron deficiency anemia secondary to blood loss (chronic): Secondary | ICD-10-CM

## 2017-05-04 DIAGNOSIS — I10 Essential (primary) hypertension: Secondary | ICD-10-CM

## 2017-05-04 MED ORDER — PRAVASTATIN SODIUM 40 MG PO TABS: 40.0000 mg | ORAL_TABLET | Freq: Every evening | ORAL | 3 refills | Status: DC

## 2017-05-04 MED ORDER — DILTIAZEM HCL ER COATED BEADS 120 MG PO CP24: 120.0000 mg | ORAL_CAPSULE | Freq: Every day | ORAL | 3 refills | Status: DC

## 2017-05-04 MED ORDER — FLECAINIDE ACETATE 150 MG PO TABS: 225.0000 mg | ORAL_TABLET | Freq: Two times a day (BID) | ORAL | 3 refills | Status: DC

## 2017-05-04 MED ORDER — METOPROLOL SUCCINATE ER 25 MG PO TB24: ORAL_TABLET | ORAL | 3 refills | Status: DC

## 2017-05-04 NOTE — Patient Instructions (Signed)
NO CHANGE WITH CURRENT MEDICATIONS.    Your physician wants you to follow-up in [place], [place]. You will receive a reminder letter in the mail two months in advance. If you don't receive a letter, please call our office to schedule the follow-up appointment.    If you need a refill on your cardiac medications before your next appointment, please call your pharmacy.

## 2017-05-04 NOTE — Progress Notes (Signed)
PCP: [name], MD  Clinic Note: Chief Complaint  Patient presents with  . 6 month f/u visit    pt c/o occasional bilateral lower extremity swelling--not new; no other Sx.  . Atrial Fibrillation    A flutter - s/p Ablation (recurrent Afib - on flecainide (high dose)    HPI: [name] is a 77 y.o. male with a PMH below who presents today for annual follow-up for history of paroxysmal A. fib and flutter with an abnormal EKG suggesting STEMI but negative cardiac cath. He has been maintained on flecainide for rhythm control. He has been maintained on warfarin for [place] - buyt d/c due to recurrent bouts of anemia.  [name] was last seen in March 2018. Doing relatively well. He was preop for eye surgery. No recurrent symptoms of A. fib.  Recent Hospitalizations:   Multiple: Most recently related to ureteral stones and transaminitis with right upper quadrant pain. -->  Was seen in the ER on December 23 followed by January 7.  Studies Personally Reviewed - (if available, images/films reviewed: From Epic Chart or Care Everywhere)  None  Interval History: [name] returns today with his daughter in very jovial state of mind.  He seems to be doing fairly well overall from a cardiac standpoint.  Besides being bothered by his kidney stone issues, he has had no further cardiac issues.  He has had no recurrence of atrial fibrillation or atrial flutter that he can tell.  Relatively asymptomatic as far as any chest tightness pressure or dyspnea with rest or exertion.  He is deconditioned, but otherwise doing well.  He he has not had any PND or orthopnea, but does note some pedal edema.  He does not like leg edema mostly is just in his feet. He is getting over a cold and so has been coughing some.  With that he has been a little bit more short of breath than usual.  However he denies any chest tightness or pressure associated with coughing or with routine activity.  Denies any  syncope/near syncope or TIA/amaurosis fugax.    No melena, hematochezia, hematuria, or epstaxis --his hemoglobin has apparently been stable. No claudication.  ROS: A comprehensive was performed. Review of Systems  Constitutional: Positive for weight loss (From my last visit, he is down 17 pounds, mostly because he does not eat as much as he used to and is probably walking more than he used to.). Negative for malaise/fatigue (Just does not have a lot of energy).  HENT: Positive for congestion. Negative for nosebleeds.   Respiratory: Positive for cough (Notably improved). Negative for shortness of breath (Only if he really exerts himself).        Recovering from URI  Cardiovascular: Positive for leg swelling (Feet only).  Gastrointestinal: Negative for blood in stool and melena.  Genitourinary: Negative for hematuria.  Musculoskeletal: Positive for joint pain. Negative for myalgias.  Neurological: Positive for dizziness and weakness (Walks with walker now.). Negative for focal weakness (Not unilateral) and headaches.  Psychiatric/Behavioral: Negative for memory loss. The patient is not nervous/anxious and does not have insomnia.   All other systems reviewed and are negative.   I have reviewed and (if needed) personally updated the patient's problem list, medications, allergies, past medical and surgical history, social and family history.   Past Medical History:  Diagnosis Date  . Anemia    takes Ferrous Sulfate daily  . Arthritis    "all over"  . Balance problem 01/2014  .  CAP (community acquired pneumonia) 09/18/2014  . Cervical radiculopathy due to degenerative joint disease of spine   . COPD (chronic obstructive pulmonary disease) ([place])   . Coronary artery disease, non-occlusive    a. 03/2010 Nonocclusive disease by cath, performed for ST elevations on ECG;  b. 06/2013 Lexi MV: EF 60%, no ischemia.  . Diabetes mellitus type II    takes Metformin and Lantus daily  . Diastolic CHF,  chronic ([place])    a. 12/2012 EF 55-60%, diast dysfxn, triv MR, mildly dil LA/RA.  [name] History of blood transfusion 1982   "when I had stomach OR"  . History of bronchitis    1998  . History of gastric ulcer   . HTN (hypertension)    takes Diltiazem daily  . Hyperlipidemia    takes Pravastatin daily  . Joint pain   . PAF (paroxysmal atrial fibrillation) (HCC)    Recurrent after atrial flutter (a. 07/2010 Status post caval tricuspid isthmus ablation by Dr. Midge Aver Metoprolol daily), currently controlled on flecainide plus diltiazem and Coumadin  . Weakness    numbness and tingling both hands    Past Surgical History:  Procedure Laterality Date  . ATRIAL ABLATION SURGERY  08/05/10   CTI ablation for atrial flutter by JA  . CARDIAC CATHETERIZATION  2012   nl LV function, no occlusive CAD, PAF  . CARDIOVERSION  12/07/2010    Successful direct current cardioversion with atrial fibrillation to normal sinus rhythm  . CARPAL TUNNEL RELEASE Bilateral 01/30/2014   Procedure: BILATERAL CARPAL TUNNEL RELEASE;  Surgeon: Marianna Payment, MD;  Location: [facility];  Service: Orthopedics;  Laterality: Bilateral;  . CATARACT EXTRACTION W/ INTRAOCULAR LENS  IMPLANT, BILATERAL Bilateral   . COLONOSCOPY [name] 12/02/2013   Procedure: COLONOSCOPY;  Surgeon: Irene Shipper, MD;  Location: [facility];  Service: Endoscopy;  Laterality: [name];  . ESOPHAGOGASTRODUODENOSCOPY [name] 09/22/2014   Procedure: ESOPHAGOGASTRODUODENOSCOPY (EGD);  Surgeon: Ronald Lobo, MD;  Location: [facility] ENDOSCOPY;  Service: Endoscopy;  Laterality: [name];  . ESOPHAGOGASTRODUODENOSCOPY [name] 07/11/2016   Procedure: ESOPHAGOGASTRODUODENOSCOPY (EGD);  Surgeon: Doran Stabler, MD;  Location: [facility] ENDOSCOPY;  Service: Endoscopy;  Laterality: [name];  . INCISION AND DRAINAGE ABSCESS / HEMATOMA OF [place] / KNEE / THIGH Left 1998   knee  . KNEE BURSECTOMY Left 1998  . LAPAROSCOPIC CHOLECYSTECTOMY  03/2010  . NM MYOVIEW LTD  07/22/2013   Normal EF ~60%, no  ischemia or infarction.  [name] PARTIAL GASTRECTOMY  1982   subtotal; "took out 30% for ulcers"  . TRANSTHORACIC ECHOCARDIOGRAM  02/16/2014   EF 60%, no RWMA. - otherwise normal  . YAG LASER APPLICATION Bilateral     Current Meds  Medication Sig  . aspirin EC 81 MG tablet Take 81 mg by mouth daily.  . B-D ULTRAFINE III SHORT PEN 31G X 8 MM MISC   . co-enzyme Q-10 30 MG capsule Take 30 mg by mouth daily.   [name] diltiazem (CARDIZEM CD) 120 MG 24 hr capsule Take 1 capsule (120 mg total) by mouth daily.  . flecainide (TAMBOCOR) 150 MG tablet Take 1.5 tablets (225 mg total) by mouth 2 (two) times daily.  . hydrocortisone cream 1 % Apply 1 application topically 2 (two) times daily.   . Insulin Glargine (LANTUS) 100 UNIT/ML Solostar Pen Inject 8 Units into the skin 2 (two) times daily.  [name] loratadine (CLARITIN) 10 MG tablet Take 10 mg by mouth every evening.   . metFORMIN (GLUCOPHAGE) 1000 MG tablet Take 1,000 mg by  mouth 2 (two) times daily with a meal.   . metoprolol succinate (TOPROL-XL) 25 MG 24 hr tablet TAKE 1/2 TABLET  (12.5 MG TOTAL) BY MOUTH DAILY.  . Multiple Vitamin (MULTIVITAMIN WITH MINERALS) TABS tablet Take 1 tablet by mouth daily.   . nitroGLYCERIN (NITROSTAT) 0.4 MG SL tablet Place 1 tablet (0.4 mg total) under the tongue every 5 (five) minutes x 3 doses as needed for chest pain.  [name] oxybutynin (DITROPAN-XL) 5 MG 24 hr tablet Take 5 mg by mouth daily.  [name] oxyCODONE-acetaminophen (PERCOCET/ROXICET) 5-325 MG tablet Take 1 tablet by mouth every 4 (four) hours as needed for severe pain.  . pantoprazole (PROTONIX) 40 MG tablet Take 1 tablet (40 mg total) by mouth 2 (two) times daily before a meal.  . polyethylene glycol (MIRALAX / GLYCOLAX) packet Take 17 g by mouth 2 (two) times daily. (Patient taking differently: Take 17 g by mouth daily as needed for moderate constipation. )  . pravastatin (PRAVACHOL) 40 MG tablet Take 1 tablet (40 mg total) by mouth every evening.  . tamsulosin (FLOMAX) 0.4  MG CAPS capsule Take 0.4 mg by mouth daily.  . [DISCONTINUED] diltiazem (CARDIZEM CD) 120 MG 24 hr capsule Take 1 capsule (120 mg total) by mouth daily.  . [DISCONTINUED] flecainide (TAMBOCOR) 150 MG tablet Take 1.5 tablets (225 mg total) by mouth 2 (two) times daily.  . [DISCONTINUED] metoprolol succinate (TOPROL-XL) 25 MG 24 hr tablet TAKE 1/2 TABLET  (12.5 MG TOTAL) BY MOUTH DAILY.  . [DISCONTINUED] pravastatin (PRAVACHOL) 40 MG tablet TAKE 1 TABLET EVERY EVENING    Allergies  Allergen Reactions  . Cyclobenzaprine Other (See Comments)    Makes it hard for him to stand Other reaction(s): Other (See Comments) Other reaction(s): Other (See Comments) Makes it hard for him to stand    Social History   Tobacco Use  . Smoking status: Current Every Day Smoker    Packs/day: 0.50    Years: 55.00    Pack years: 27.50    Types: Cigarettes  . Smokeless tobacco: Never Used  . Tobacco comment: He's been smoking between 0.5 and 2 ppd since age 62.  Substance Use Topics  . Alcohol use: No    Alcohol/week: 0.0 oz    Comment: "quit drinking in 1986"  . Drug use: No   Social History   Social History Narrative   He is a widower, who moved to [place] in 2011 to live closer to his daughter. He formerly lived in [place]. He is a father of 1, grandfather, and great-grandfather of 38. Does not drink alcohol, context of smoker.  --Despite his intentions of quitting smoking, he is yet to do so since I met him 6 years ago.  Not really interested in adjusting.  He says that he really has no desire to fully quit.  family history includes Cancer in his mother; Heart attack in his brother and father.  Wt Readings from Last 3 Encounters:  05/04/17 171 lb (77.6 kg)  03/11/17 160 lb (72.6 kg)  01/12/17 170 lb (77.1 kg)  05/2016 - 188 Lb  PHYSICAL EXAM BP (!) 110/50 (BP Location: Left Arm, Patient Position: Sitting, Cuff Size: Normal)   Pulse 85   Ht 5' 6"  (1.676 m)   Wt 171 lb (77.6 kg)    BMI 27.60 kg/m  Physical Exam  Constitutional: He is oriented to person, place, and time. He appears well-developed and well-nourished. No distress.  He is actually in his usual relatively well-groomed  stated being.  He talks with his eyes almost fully closed.  He appears older than when I last saw him, has lost weight, but does not look emaciated.  HENT:  Head: Normocephalic and atraumatic.  Neck: No JVD present.  Cardiovascular: Normal rate and regular rhythm.  Pulmonary/Chest: No respiratory distress. He has wheezes. He has no rales. He exhibits tenderness.  He uses accessory muscles at baseline and has increased AP diameter.  But he has no increased work of breathing.  Abdominal: Soft. Bowel sounds are normal. He exhibits no distension. There is no tenderness. There is no rebound.  Musculoskeletal: Normal range of motion. He exhibits edema (Trivial swelling in the feet).  Neurological: He is alert and oriented to person, place, and time. No cranial nerve deficit.  Skin: Skin is warm and dry.  Psychiatric: He has a normal mood and affect. His behavior is normal. Judgment and thought content normal.     Adult ECG Report  Rate: 85 ;  Rhythm: normal sinus rhythm and 1 degree AV block (PR interval 318).  Otherwise normal axis, and durations.  Narrative Interpretation: Stable EKG   Other studies Reviewed: Additional studies/ records that were reviewed today include:  Recent Labs:   Lab Results  Component Value Date   CREATININE 0.71 03/18/2017   BUN 14 03/18/2017   NA 137 03/18/2017   K 4.1 03/18/2017   CL 105 03/18/2017   CO2 26 03/18/2017   Lab Results  Component Value Date   CHOL 155 07/22/2013   HDL 37 (L) 07/22/2013   LDLCALC 72 07/22/2013   TRIG 229 (H) 07/22/2013   CHOLHDL 4.2 07/22/2013  - followed by PCP  CBC Latest Ref Rng & Units 03/18/2017 03/09/2017 01/22/2017  WBC 4.0 - 10.5 K/uL 8.8 4.9 5.4  Hemoglobin 13.0 - 17.0 g/dL 8.1(L) 8.6(L) 11.1(L)  Hematocrit 39.0  - 52.0 % 27.1(L) 28.3(L) 34.3(L)  Platelets 150 - 400 K/uL 415(H) 358 269    ASSESSMENT / PLAN: Problem List Items Addressed This Visit    Anemia    His hemoglobin levels took a notable drop in December as opposed to October of last year.  Still no obvious source of bleeding found.  This was concerning because of the setting of nephrolithiasis, concern for bleed there.  As a result, would simply continue to stay off of anticoagulation for now.      Atrial flutter - status post CTI ablation (Chronic)    He does not seem to have any breakthrough episodes of either a flutter or A. fib on flecainide.  Nothing since his atrial flutter ablation to suggest flutter.      Relevant Medications   diltiazem (CARDIZEM CD) 120 MG 24 hr capsule   metoprolol succinate (TOPROL-XL) 25 MG 24 hr tablet   pravastatin (PRAVACHOL) 40 MG tablet   flecainide (TAMBOCOR) 150 MG tablet   Chronic diastolic congestive heart failure (HCC) (Chronic)    Euvolemic on exam.  Blood pressures well controlled with simply having low-dose metoprolol and diltiazem.  Not requiring diuretic.  Only has symptoms if he is in A. fib or flutter.      Relevant Medications   diltiazem (CARDIZEM CD) 120 MG 24 hr capsule   metoprolol succinate (TOPROL-XL) 25 MG 24 hr tablet   pravastatin (PRAVACHOL) 40 MG tablet   flecainide (TAMBOCOR) 150 MG tablet   Other Relevant Orders   EKG 12-Lead   Coronary artery disease, non-occlusive (Chronic)    Relatively normal ischemic evaluation in the setting  of abnormal EKG concerning for possible STEMI.  Had minimal CAD.  He is on combination of beta-blocker and diltiazem both have antianginal effect.  He is on aspirin and pravastatin.      Relevant Medications   diltiazem (CARDIZEM CD) 120 MG 24 hr capsule   metoprolol succinate (TOPROL-XL) 25 MG 24 hr tablet   pravastatin (PRAVACHOL) 40 MG tablet   flecainide (TAMBOCOR) 150 MG tablet   Essential hypertension (Chronic)    Well-controlled on  current meds.  No change      Relevant Medications   diltiazem (CARDIZEM CD) 120 MG 24 hr capsule   metoprolol succinate (TOPROL-XL) 25 MG 24 hr tablet   pravastatin (PRAVACHOL) 40 MG tablet   flecainide (TAMBOCOR) 150 MG tablet   PAF (paroxysmal atrial fibrillation), maintaining SR; CHA2DS2Vasc = 7 - Primary (Chronic)    He had a combination of atrial flutter and atrial fibrillation status post flutter ablation.  Now is still had some recurrent episodes of atrial fib.  Currently remains in sinus rhythm and is on high-dose flecainide along with diltiazem and metoprolol for rate control.  He did not seem to have a breakthrough of atrial fibrillation since last I saw him for now would continue beta-blocker and calcium channel blocker combined.  He is no longer on oral anticoagulation because of his history of GI bleeds.  We talked about it and he is simply can take aspirin for now.  Just because of how many episodes of GI bleed and other bleeding issues he has had, especially with kidney stones in place, I would be fearful that he would have a significant bleed.      Relevant Medications   diltiazem (CARDIZEM CD) 120 MG 24 hr capsule   metoprolol succinate (TOPROL-XL) 25 MG 24 hr tablet   pravastatin (PRAVACHOL) 40 MG tablet   flecainide (TAMBOCOR) 150 MG tablet   Other Relevant Orders   EKG 12-Lead      Current medicines are reviewed at length with the patient today. (+/- concerns) n/a The following changes have been made: n.a  Patient Instructions  NO CHANGE WITH CURRENT MEDICATIONS.    Your physician wants you to follow-up in [place], [place]. You will receive a reminder letter in the mail two months in advance. If you don't receive a letter, please call our office to schedule the follow-up appointment.    If you need a refill on your cardiac medications before your next appointment, please call your pharmacy.    Studies Ordered:    Orders Placed This Encounter  Procedures  . EKG 12-Lead      Glenetta Hew, M.D., M.S. Interventional Cardiologist   Pager # [phone] Phone # [phone] [address] [place], [place] [address]   Thank you for choosing [facility] at [facility]!!

## 2017-05-05 IMAGING — DX DG CHEST 2V
2 series · 2 of 2 positions shown · non-contrast
Comparison: 05/15/2015

CLINICAL DATA: Upper respiratory tract infection.

EXAM:
CHEST  2 VIEW

[w chest pa]
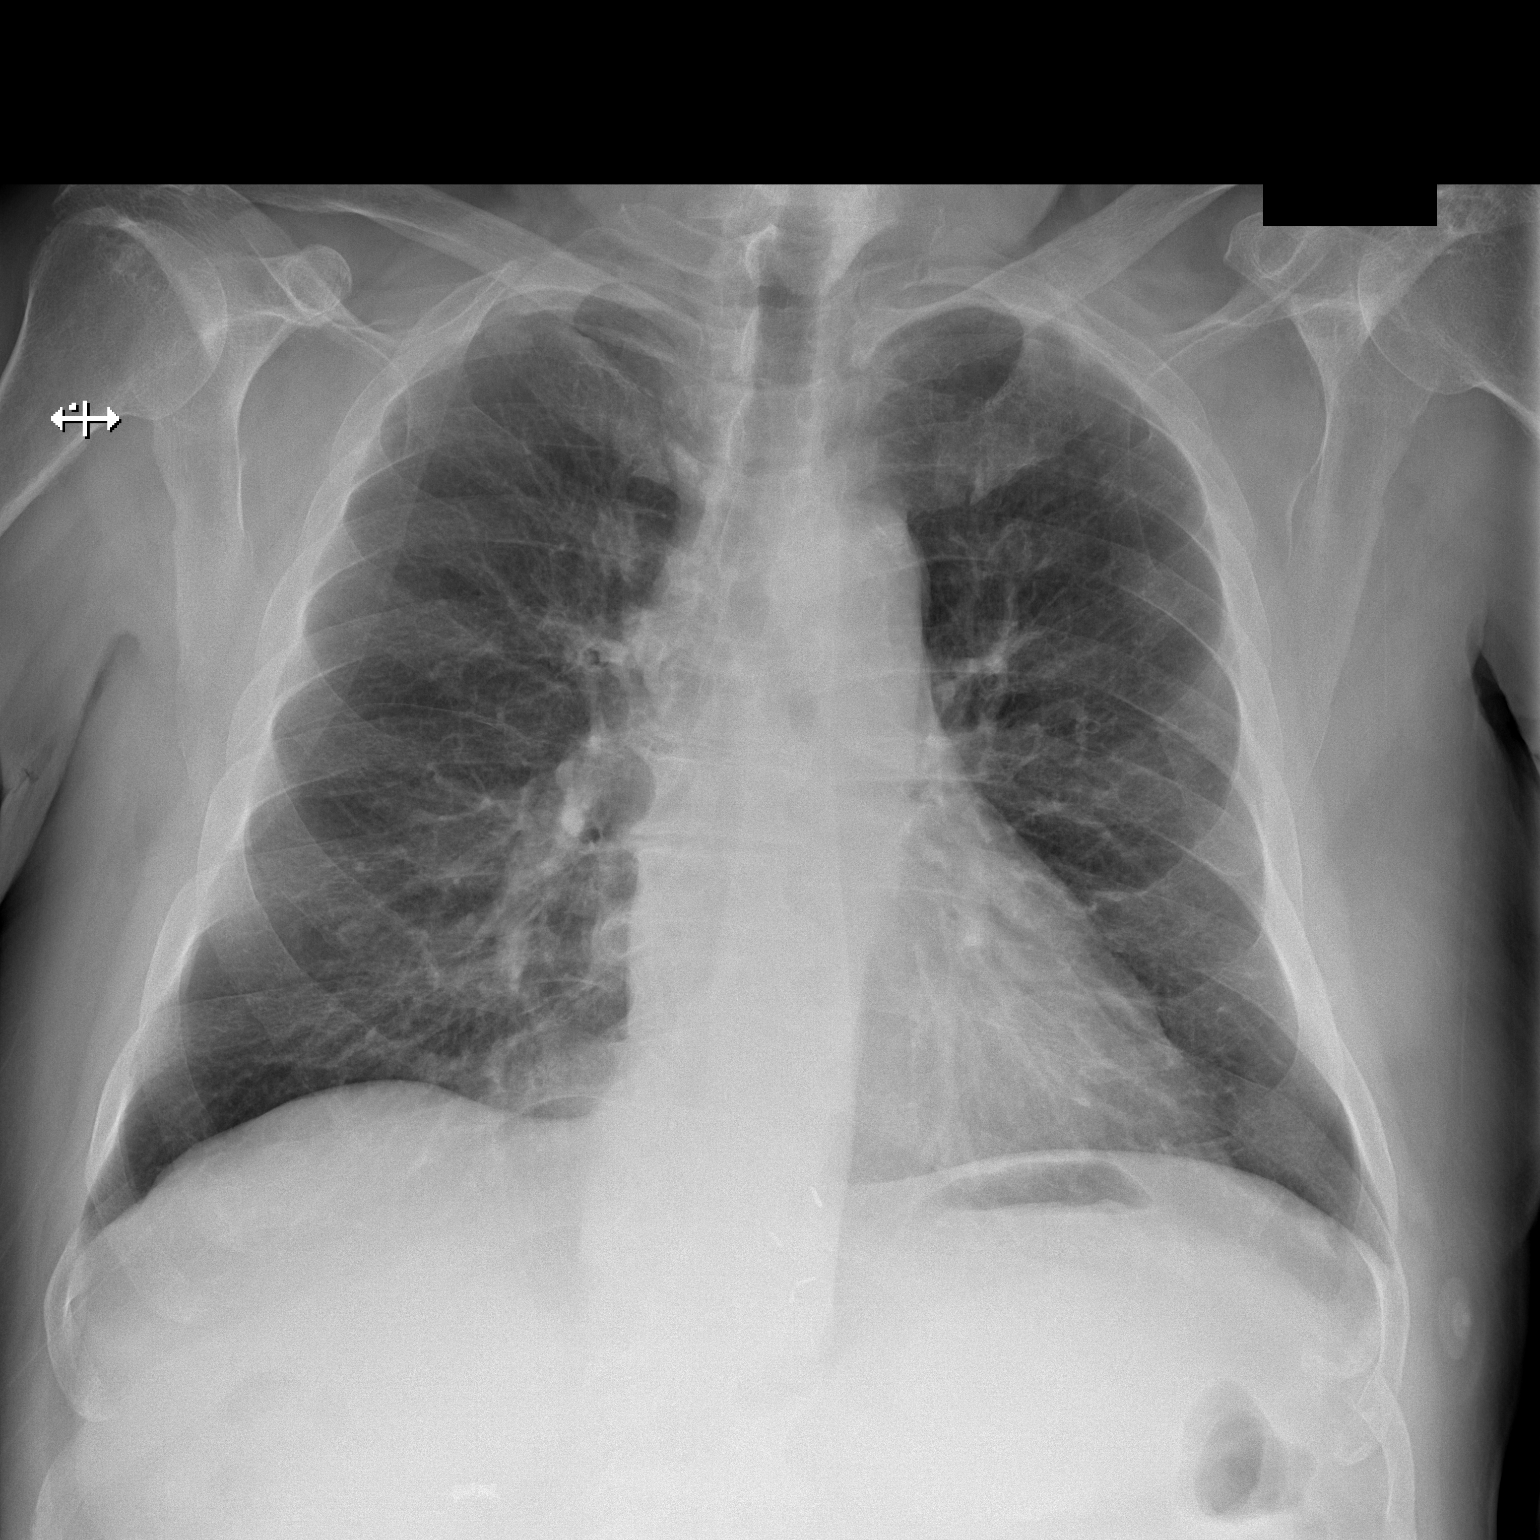

[w chest lat]
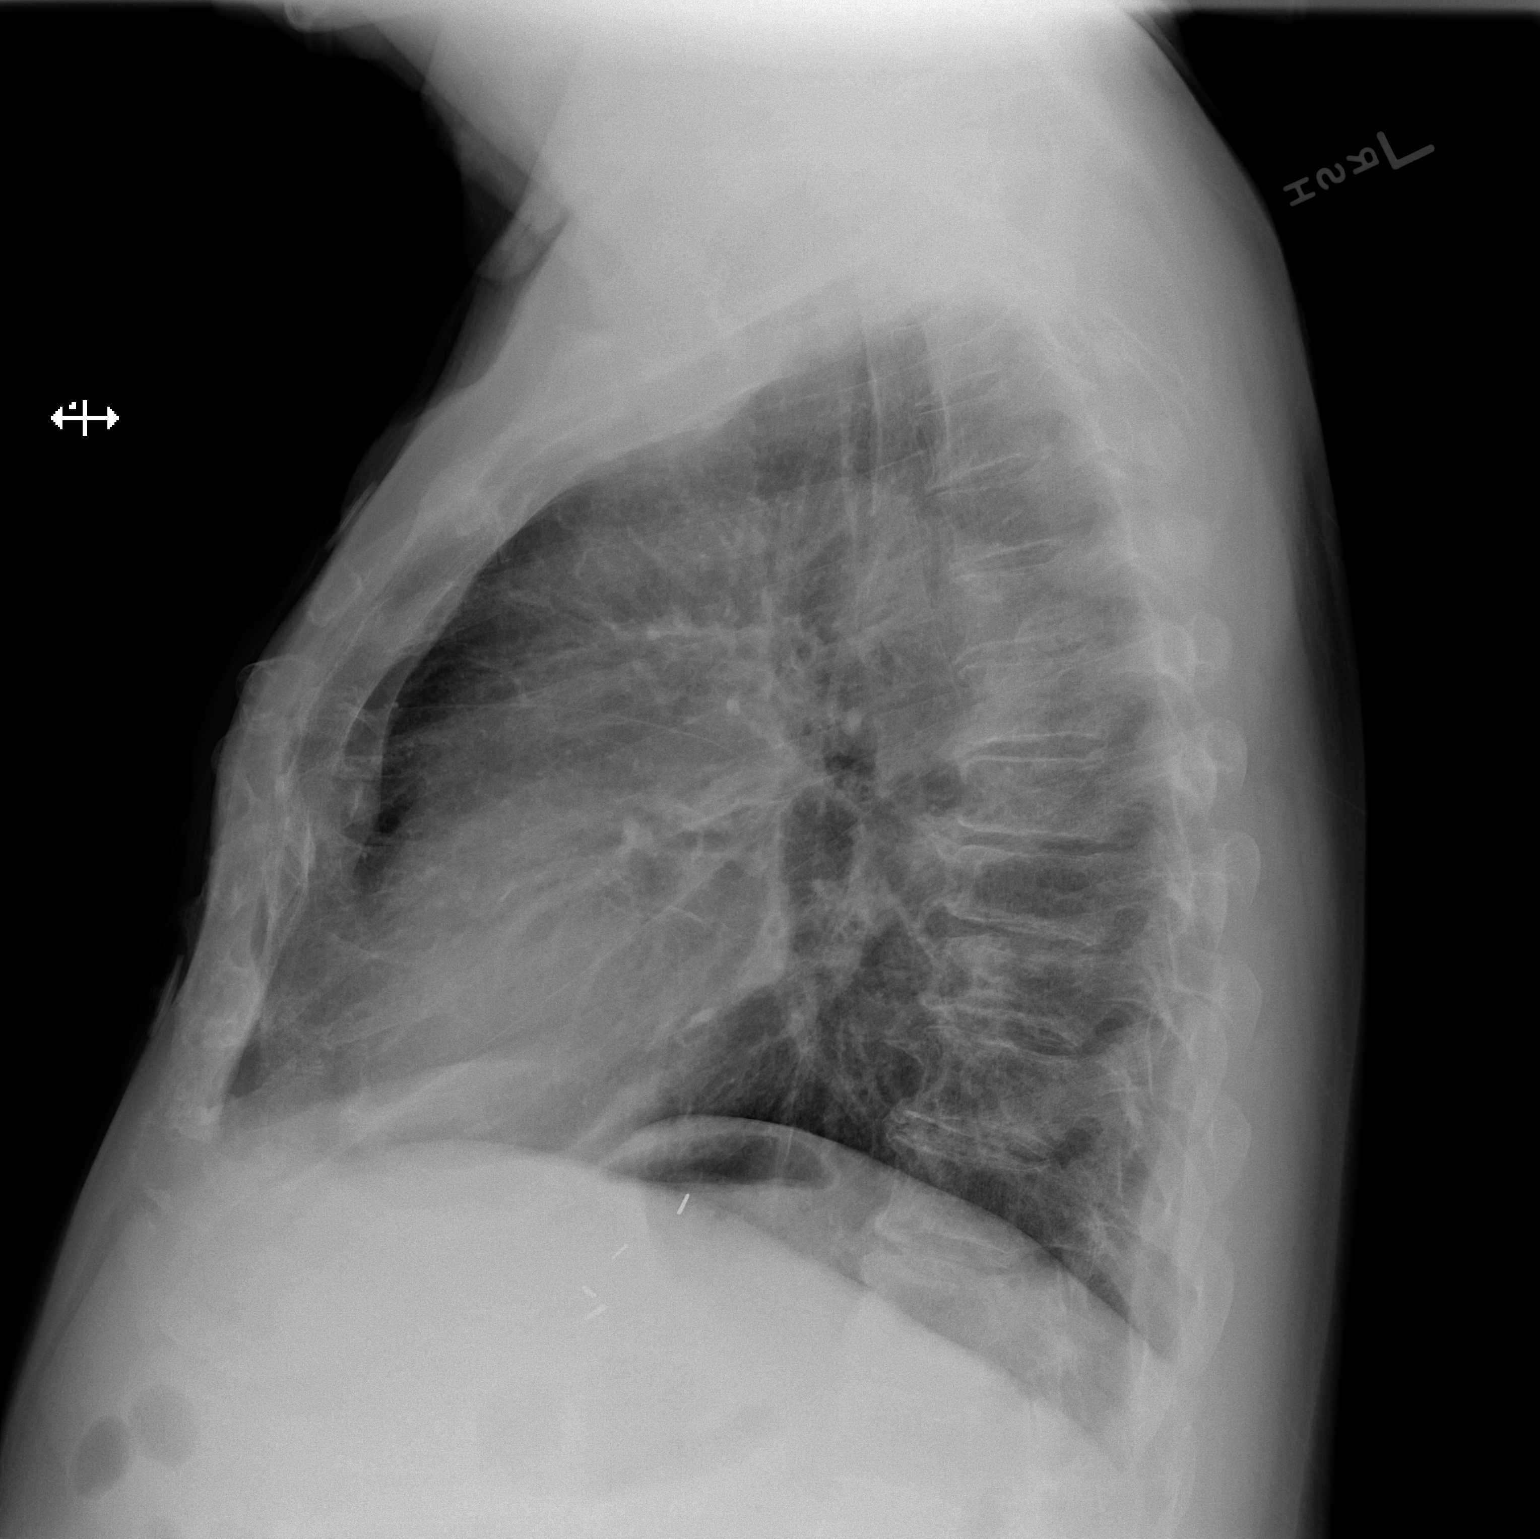

[2 of 2 positions shown; findings below may reference images not displayed]

FINDINGS: Heart size is normal. No pleural or pericardial effusion. Coarsened
interstitial markings are identified bilaterally. Multi level
degenerative disc disease identified throughout the thoracic spine.
No superimposed airspace consolidation.
IMPRESSION: 1. No acute cardiopulmonary abnormalities.
2. Chronic interstitial coarsening.

## 2017-05-06 ENCOUNTER — Encounter: Payer: Self-pay | Admitting: Cardiology

## 2017-05-06 NOTE — Assessment & Plan Note (Signed)
He does not seem to have any breakthrough episodes of either a flutter or A. fib on flecainide.  Nothing since his atrial flutter ablation to suggest flutter.

## 2017-05-06 NOTE — Assessment & Plan Note (Signed)
Euvolemic on exam.  Blood pressures well controlled with simply having low-dose metoprolol and diltiazem.  Not requiring diuretic.  Only has symptoms if he is in A. fib or flutter.

## 2017-05-06 NOTE — Assessment & Plan Note (Signed)
His hemoglobin levels took a notable drop in December as opposed to October of last year.  Still no obvious source of bleeding found.  This was concerning because of the setting of nephrolithiasis, concern for bleed there.  As a result, would simply continue to stay off of anticoagulation for now.

## 2017-05-06 NOTE — Assessment & Plan Note (Signed)
Well-controlled on current meds.  No change 

## 2017-05-06 NOTE — Assessment & Plan Note (Signed)
Relatively normal ischemic evaluation in the setting of abnormal EKG concerning for possible STEMI.  Had minimal CAD.  He is on combination of beta-blocker and diltiazem both have antianginal effect.  He is on aspirin and pravastatin.

## 2017-05-06 NOTE — Assessment & Plan Note (Signed)
He had a combination of atrial flutter and atrial fibrillation status post flutter ablation.  Now is still had some recurrent episodes of atrial fib.  Currently remains in sinus rhythm and is on high-dose flecainide along with diltiazem and metoprolol for rate control.  He did not seem to have a breakthrough of atrial fibrillation since last I saw him for now would continue beta-blocker and calcium channel blocker combined.  He is no longer on oral anticoagulation because of his history of GI bleeds.  We talked about it and he is simply can take aspirin for now.  Just because of how many episodes of GI bleed and other bleeding issues he has had, especially with kidney stones in place, I would be fearful that he would have a significant bleed.

## 2017-05-21 ENCOUNTER — Encounter (HOSPITAL_COMMUNITY): Payer: Self-pay | Admitting: Emergency Medicine

## 2017-05-21 ENCOUNTER — Emergency Department (HOSPITAL_COMMUNITY): Payer: Medicare Other

## 2017-05-21 ENCOUNTER — Observation Stay (HOSPITAL_COMMUNITY)
Admission: EM | Admit: 2017-05-21 | Discharge: 2017-05-22 | Disposition: A | Discharge status: Home / Self Care | Payer: Medicare Other | Attending: Internal Medicine | Admitting: Internal Medicine

## 2017-05-21 ENCOUNTER — Other Ambulatory Visit: Payer: Self-pay

## 2017-05-21 DIAGNOSIS — Z8673 Personal history of transient ischemic attack (TIA), and cerebral infarction without residual deficits: Secondary | ICD-10-CM | POA: Diagnosis not present

## 2017-05-21 DIAGNOSIS — Z79899 Other long term (current) drug therapy: Secondary | ICD-10-CM | POA: Diagnosis not present

## 2017-05-21 DIAGNOSIS — D649 Anemia, unspecified: Principal | ICD-10-CM

## 2017-05-21 DIAGNOSIS — I10 Essential (primary) hypertension: Secondary | ICD-10-CM | POA: Diagnosis present

## 2017-05-21 DIAGNOSIS — J449 Chronic obstructive pulmonary disease, unspecified: Secondary | ICD-10-CM | POA: Diagnosis not present

## 2017-05-21 DIAGNOSIS — Z794 Long term (current) use of insulin: Secondary | ICD-10-CM | POA: Diagnosis not present

## 2017-05-21 DIAGNOSIS — Z7982 Long term (current) use of aspirin: Secondary | ICD-10-CM | POA: Diagnosis not present

## 2017-05-21 DIAGNOSIS — F17210 Nicotine dependence, cigarettes, uncomplicated: Secondary | ICD-10-CM | POA: Insufficient documentation

## 2017-05-21 DIAGNOSIS — E119 Type 2 diabetes mellitus without complications: Secondary | ICD-10-CM

## 2017-05-21 DIAGNOSIS — I4892 Unspecified atrial flutter: Secondary | ICD-10-CM | POA: Diagnosis not present

## 2017-05-21 DIAGNOSIS — I2510 Atherosclerotic heart disease of native coronary artery without angina pectoris: Secondary | ICD-10-CM | POA: Diagnosis present

## 2017-05-21 DIAGNOSIS — E785 Hyperlipidemia, unspecified: Secondary | ICD-10-CM | POA: Insufficient documentation

## 2017-05-21 DIAGNOSIS — R069 Unspecified abnormalities of breathing: Secondary | ICD-10-CM | POA: Diagnosis not present

## 2017-05-21 DIAGNOSIS — I5032 Chronic diastolic (congestive) heart failure: Secondary | ICD-10-CM | POA: Diagnosis not present

## 2017-05-21 DIAGNOSIS — Z7901 Long term (current) use of anticoagulants: Secondary | ICD-10-CM | POA: Insufficient documentation

## 2017-05-21 DIAGNOSIS — I480 Paroxysmal atrial fibrillation: Secondary | ICD-10-CM | POA: Insufficient documentation

## 2017-05-21 DIAGNOSIS — I110 Hypertensive heart disease with heart failure: Secondary | ICD-10-CM | POA: Insufficient documentation

## 2017-05-21 DIAGNOSIS — E782 Mixed hyperlipidemia: Secondary | ICD-10-CM | POA: Diagnosis not present

## 2017-05-21 DIAGNOSIS — Z8679 Personal history of other diseases of the circulatory system: Secondary | ICD-10-CM | POA: Diagnosis not present

## 2017-05-21 DIAGNOSIS — R0602 Shortness of breath: Secondary | ICD-10-CM | POA: Diagnosis present

## 2017-05-21 DIAGNOSIS — R05 Cough: Secondary | ICD-10-CM | POA: Diagnosis not present

## 2017-05-21 DIAGNOSIS — R531 Weakness: Secondary | ICD-10-CM | POA: Diagnosis not present

## 2017-05-21 DIAGNOSIS — E1100 Type 2 diabetes mellitus with hyperosmolarity without nonketotic hyperglycemic-hyperosmolar coma (NKHHC): Secondary | ICD-10-CM

## 2017-05-21 LAB — RETICULOCYTES
RBC.: 3.04 MIL/uL — ABNORMAL LOW (ref 4.22–5.81)
Retic Count, Absolute: 97.3 10*3/uL (ref 19.0–186.0)
Retic Ct Pct: 3.2 % — ABNORMAL HIGH (ref 0.4–3.1)

## 2017-05-21 LAB — IRON AND TIBC
IRON: 13 ug/dL — AB (ref 45–182)
Saturation Ratios: 3 % — ABNORMAL LOW (ref 17.9–39.5)
TIBC: 475 ug/dL — AB (ref 250–450)
UIBC: 462 ug/dL

## 2017-05-21 LAB — COMPREHENSIVE METABOLIC PANEL
ALBUMIN: 3.1 g/dL — AB (ref 3.5–5.0)
ALT: 16 U/L — ABNORMAL LOW (ref 17–63)
AST: 31 U/L (ref 15–41)
Alkaline Phosphatase: 73 U/L (ref 38–126)
Anion gap: 9 (ref 5–15)
BILIRUBIN TOTAL: 0.4 mg/dL (ref 0.3–1.2)
BUN: 21 mg/dL — ABNORMAL HIGH (ref 6–20)
CO2: 22 mmol/L (ref 22–32)
Calcium: 8.5 mg/dL — ABNORMAL LOW (ref 8.9–10.3)
Chloride: 107 mmol/L (ref 101–111)
Creatinine, Ser: 0.88 mg/dL (ref 0.61–1.24)
GFR calc Af Amer: >60 mL/min (ref >60)
GFR calc non Af Amer: >60 mL/min (ref >60)
GLUCOSE: 105 mg/dL — AB (ref 65–99)
POTASSIUM: 4.3 mmol/L (ref 3.5–5.1)
Sodium: 138 mmol/L (ref 135–145)
TOTAL PROTEIN: 6.2 g/dL — AB (ref 6.5–8.1)

## 2017-05-21 LAB — CBC WITH DIFFERENTIAL/PLATELET
BASOS ABS: 0 10*3/uL (ref 0.0–0.1)
Basophils Relative: 1 %
EOS ABS: 0.1 10*3/uL (ref 0.0–0.7)
EOS PCT: 2 %
HEMATOCRIT: 20.1 % — AB (ref 39.0–52.0)
Hemoglobin: 5.2 g/dL — CL (ref 13.0–17.0)
LYMPHS PCT: 11 %
Lymphs Abs: 0.6 10*3/uL — ABNORMAL LOW (ref 0.7–4.0)
MCH: 17.5 pg — ABNORMAL LOW (ref 26.0–34.0)
MCHC: 25.9 g/dL — AB (ref 30.0–36.0)
MCV: 67.7 fL — ABNORMAL LOW (ref 78.0–100.0)
MONO ABS: 0.9 10*3/uL (ref 0.1–1.0)
Monocytes Relative: 15 %
Neutro Abs: 4.4 10*3/uL (ref 1.7–7.7)
Neutrophils Relative %: 71 %
PLATELETS: 291 10*3/uL (ref 150–400)
RBC: 2.97 MIL/uL — AB (ref 4.22–5.81)
RDW: 19.2 % — AB (ref 11.5–15.5)
WBC: 6 10*3/uL (ref 4.0–10.5)

## 2017-05-21 LAB — FERRITIN: Ferritin: 4 ng/mL — ABNORMAL LOW (ref 24–336)

## 2017-05-21 LAB — PREPARE RBC (CROSSMATCH)

## 2017-05-21 LAB — I-STAT TROPONIN, ED: TROPONIN I, POC: 0 ng/mL (ref 0.00–0.08)

## 2017-05-21 LAB — POC OCCULT BLOOD, ED: FECAL OCCULT BLD: NEGATIVE

## 2017-05-21 LAB — VITAMIN B12: Vitamin B-12: 611 pg/mL (ref 180–914)

## 2017-05-21 LAB — LACTATE DEHYDROGENASE: LDH: 130 U/L (ref 98–192)

## 2017-05-21 LAB — BRAIN NATRIURETIC PEPTIDE: B NATRIURETIC PEPTIDE 5: 245.7 pg/mL — AB (ref 0.0–100.0)

## 2017-05-21 LAB — PROTIME-INR
INR: 1.08
PROTHROMBIN TIME: 13.9 s (ref 11.4–15.2)

## 2017-05-21 LAB — GLUCOSE, CAPILLARY: Glucose-Capillary: 149 mg/dL — ABNORMAL HIGH (ref 65–99)

## 2017-05-21 LAB — FOLATE: Folate: 66 ng/mL (ref >5.9)

## 2017-05-21 MED ORDER — PANTOPRAZOLE SODIUM 40 MG IV SOLR: 40.0000 mg | Freq: Two times a day (BID) | INTRAVENOUS | Status: DC

## 2017-05-21 MED ORDER — PANTOPRAZOLE SODIUM 40 MG PO TBEC
40.0000 mg | DELAYED_RELEASE_TABLET | Freq: Two times a day (BID) | ORAL | Status: DC
  Administered 2017-05-21 – 2017-05-22 (×2): 40 mg via ORAL
  Filled 2017-05-21 (×2): qty 1

## 2017-05-21 MED ORDER — INSULIN GLARGINE 100 UNIT/ML SOLOSTAR PEN: 8.0000 [IU] | PEN_INJECTOR | Freq: Two times a day (BID) | SUBCUTANEOUS | Status: DC

## 2017-05-21 MED ORDER — LEVALBUTEROL HCL 0.63 MG/3ML IN NEBU: 0.6300 mg | INHALATION_SOLUTION | Freq: Four times a day (QID) | RESPIRATORY_TRACT | Status: DC | PRN

## 2017-05-21 MED ORDER — SODIUM CHLORIDE 0.9% FLUSH
3.0000 mL | Freq: Two times a day (BID) | INTRAVENOUS | Status: DC
  Administered 2017-05-22: 3 mL via INTRAVENOUS

## 2017-05-21 MED ORDER — ACETAMINOPHEN 650 MG RE SUPP: 650.0000 mg | Freq: Four times a day (QID) | RECTAL | Status: DC | PRN

## 2017-05-21 MED ORDER — TAMSULOSIN HCL 0.4 MG PO CAPS
0.4000 mg | ORAL_CAPSULE | Freq: Every day | ORAL | Status: DC
  Administered 2017-05-22: 0.4 mg via ORAL
  Filled 2017-05-21: qty 1

## 2017-05-21 MED ORDER — METFORMIN HCL 500 MG PO TABS
1000.0000 mg | ORAL_TABLET | Freq: Two times a day (BID) | ORAL | Status: DC
  Administered 2017-05-22: 1000 mg via ORAL
  Filled 2017-05-21: qty 2

## 2017-05-21 MED ORDER — ACETAMINOPHEN 325 MG PO TABS: 650.0000 mg | ORAL_TABLET | Freq: Four times a day (QID) | ORAL | Status: DC | PRN

## 2017-05-21 MED ORDER — PRAVASTATIN SODIUM 40 MG PO TABS
40.0000 mg | ORAL_TABLET | Freq: Every evening | ORAL | Status: DC
  Administered 2017-05-21: 40 mg via ORAL
  Filled 2017-05-21: qty 1

## 2017-05-21 MED ORDER — FUROSEMIDE 10 MG/ML IJ SOLN
40.0000 mg | Freq: Once | INTRAMUSCULAR | Status: AC
  Administered 2017-05-22: 40 mg via INTRAVENOUS
  Filled 2017-05-21: qty 4

## 2017-05-21 MED ORDER — OXYBUTYNIN CHLORIDE ER 5 MG PO TB24
5.0000 mg | ORAL_TABLET | Freq: Every day | ORAL | Status: DC
  Administered 2017-05-22: 5 mg via ORAL
  Filled 2017-05-21: qty 1

## 2017-05-21 MED ORDER — SODIUM CHLORIDE 0.9 % IV SOLN: Freq: Once | INTRAVENOUS | Status: DC

## 2017-05-21 MED ORDER — INSULIN ASPART 100 UNIT/ML ~~LOC~~ SOLN: 0.0000 [IU] | Freq: Every day | SUBCUTANEOUS | Status: DC

## 2017-05-21 MED ORDER — ADULT MULTIVITAMIN W/MINERALS CH
1.0000 | ORAL_TABLET | Freq: Every day | ORAL | Status: DC
  Administered 2017-05-22: 1 via ORAL
  Filled 2017-05-21: qty 1

## 2017-05-21 MED ORDER — INSULIN ASPART 100 UNIT/ML ~~LOC~~ SOLN
0.0000 [IU] | Freq: Three times a day (TID) | SUBCUTANEOUS | Status: DC
  Administered 2017-05-22: 1 [IU] via SUBCUTANEOUS

## 2017-05-21 MED ORDER — FLECAINIDE ACETATE 50 MG PO TABS: 225.0000 mg | ORAL_TABLET | Freq: Two times a day (BID) | ORAL | Status: DC

## 2017-05-21 MED ORDER — INSULIN GLARGINE 100 UNIT/ML ~~LOC~~ SOLN
8.0000 [IU] | Freq: Two times a day (BID) | SUBCUTANEOUS | Status: DC
  Administered 2017-05-21 – 2017-05-22 (×2): 8 [IU] via SUBCUTANEOUS
  Filled 2017-05-21 (×3): qty 0.08

## 2017-05-21 NOTE — ED Notes (Signed)
Blood Consent signed electronically.

## 2017-05-21 NOTE — H&P (Addendum)
History and Physical    [name] TEL:[id number] DOB: 1941-02-23 DOA: 05/21/2017  **Will place patient in observation status based on the expectation that the patient will need hospitalization/ hospital care that will be less than or equal to 24 hours  PCP: [name], MD Althia Forts  Attending physician: Renne Crigler  Patient coming from/Resides with: Private residence  Chief Complaint: Generalized weakness, bilateral arm aching and shortness of breath  HPI: [name] is a 77 y.o. male with medical history significant for nonobstructive CAD with last cardiac catheterization 2012, history of atrial flutter status post ablation currently maintaining sinus rhythm with first-degree AV block, COPD, diabetes on both oral agents and insulin, ongoing tobacco abuse.  Patient has a history of a Billroth II gastrojejunostomy procedure and has had several admissions for symptomatic anemia.  His last admission in September he underwent EGD that revealed severe esophagitis but no other significant findings regarding the anemia.  He received transfusion and iron infusion and followed up after discharge with hematology in October.  At that time hemoglobin was 11.8.  Patient reports 5-6 weeks of generalized weakness, aching in both arms at rest and with activity as well as shortness of breath especially with activity.  He denies dark or bloody stools.  He does not take oral iron formulations but continues his PPI twice daily.  He has been recently seen by his cardiology who did not feel his symptoms are related to his underlying cardiac disease.  Because of persistent symptoms he presented to the ER today and was found to have a hemoglobin of 5.2 with an MCV of 67.7, and platelets 291,000.  Occult blood was negative.  EDP has ordered 2 units of packed red blood cells and anemia panel has been obtained.  ED Course:  Vital Signs: BP (!) 114/45   Pulse 76   Temp 98.2 F (36.8 C) (Oral)    Resp (!) 21   Ht _0  (1.676 m)   Wt 77.1 kg (170 lb)   SpO2 95%   BMI 27.44 kg/m  CXR: No acute process although if there was noted to be mild increased pulmonary interstitium bilaterally which can be seen in mild edema or infectious inflammatory process Lab data: Sodium 138, potassium 4.3, BUN 21, creatinine 0.88, albumin 3.1, BNP 246, poc troponin normal, white count 6000 with hemoglobin 5.2, hematocrit 20.1, MCV 67.7, platelets 291,000, FOB neg Medications and treatments: 2 units PRBCs have been ordered by EDP  Review of Systems:  In addition to the HPI above,  No Fever-chills, myalgias or other constitutional symptoms No Headache, changes with Vision or hearing, new weakness, tingling, numbness in any extremity, dizziness, dysarthria or word finding difficulty, gait disturbance or imbalance, tremors or seizure activity No problems swallowing food or Liquids, indigestion/reflux, choking or coughing while eating, abdominal pain with or after eating No palpitations, orthopnea  No Abdominal pain, N/V, melena,hematochezia, dark tarry stools, constipation No dysuria, malodorous urine, hematuria or flank pain No new skin rashes, lesions, masses or bruises, No new joint pains, aches, swelling or redness No recent unintentional weight gain or loss No polyuria, polydypsia or polyphagia   Past Medical History:  Diagnosis Date  . Anemia    takes Ferrous Sulfate daily  . Arthritis    "all over"  . Balance problem 01/2014  . CAP (community acquired pneumonia) 09/18/2014  . Cervical radiculopathy due to degenerative joint disease of spine   . COPD (chronic obstructive pulmonary disease) ([place])   . Coronary artery disease,  non-occlusive    a. 03/2010 Nonocclusive disease by cath, performed for ST elevations on ECG;  b. 06/2013 Lexi MV: EF 60%, no ischemia.  . Diabetes mellitus type II    takes Metformin and Lantus daily  . Diastolic CHF, chronic ([place])    a. 12/2012 EF 55-60%, diast dysfxn,  triv MR, mildly dil LA/RA.  [name] History of blood transfusion 1982   "when I had stomach OR"  . History of bronchitis    1998  . History of gastric ulcer   . HTN (hypertension)    takes Diltiazem daily  . Hyperlipidemia    takes Pravastatin daily  . Joint pain   . PAF (paroxysmal atrial fibrillation) ([facility])    Recurrent after atrial flutter (a. 07/2010 Status post caval tricuspid isthmus ablation by Dr. Midge Aver Metoprolol daily), currently controlled on flecainide plus diltiazem and Coumadin  . Weakness    numbness and tingling both hands    Past Surgical History:  Procedure Laterality Date  . ATRIAL ABLATION SURGERY  08/05/10   CTI ablation for atrial flutter by JA  . CARDIAC CATHETERIZATION  2012   nl LV function, no occlusive CAD, PAF  . CARDIOVERSION  12/07/2010    Successful direct current cardioversion with atrial fibrillation to normal sinus rhythm  . CARPAL TUNNEL RELEASE Bilateral 01/30/2014   Procedure: BILATERAL CARPAL TUNNEL RELEASE;  Surgeon: Marianna Payment, MD;  Location: [place];  Service: Orthopedics;  Laterality: Bilateral;  . CATARACT EXTRACTION W/ INTRAOCULAR LENS  IMPLANT, BILATERAL Bilateral   . COLONOSCOPY [name] 12/02/2013   Procedure: COLONOSCOPY;  Surgeon: Irene Shipper, MD;  Location: [place];  Service: Endoscopy;  Laterality: [name];  . ESOPHAGOGASTRODUODENOSCOPY [name] 09/22/2014   Procedure: ESOPHAGOGASTRODUODENOSCOPY (EGD);  Surgeon: Ronald Lobo, MD;  Location: [facility] ENDOSCOPY;  Service: Endoscopy;  Laterality: [name];  . ESOPHAGOGASTRODUODENOSCOPY [name] 07/11/2016   Procedure: ESOPHAGOGASTRODUODENOSCOPY (EGD);  Surgeon: Doran Stabler, MD;  Location[facility] ENDOSCOPY;  Service: Endoscopy;  Laterality: [name];  . INCISION AND DRAINAGE ABSCESS / HEMATOMA OF BURSA / KNEE / THIGH Left 1998   knee  . KNEE BURSECTOMY Left 1998  . LAPAROSCOPIC CHOLECYSTECTOMY  03/2010  . NM MYOVIEW LTD  07/22/2013   Normal EF ~60%, no ischemia or infarction.  [name] PARTIAL GASTRECTOMY  1982    subtotal; "took out 30% for ulcers"  . TRANSTHORACIC ECHOCARDIOGRAM  02/16/2014   EF 60%, no RWMA. - otherwise normal  . YAG LASER APPLICATION Bilateral     Social History   Socioeconomic History  . Marital status: Widowed    Spouse name: Not on file  . Number of children: Not on file  . Years of education: Not on file  . Highest education level: Not on file  Social Needs  . Financial resource strain: Not on file  . Food insecurity - worry: Not on file  . Food insecurity - inability: Not on file  . Transportation needs - medical: Not on file  . Transportation needs - non-medical: Not on file  Occupational History  . Occupation: Retired  Tobacco Use  . Smoking status: Current Every Day Smoker    Packs/day: 0.50    Years: 55.00    Pack years: 27.50    Types: Cigarettes  . Smokeless tobacco: Never Used  . Tobacco comment: He's been smoking between 0.5 and 2 ppd since age 53.  Substance and Sexual Activity  . Alcohol use: No    Alcohol/week: 0.0 oz    Comment: "quit drinking in 1986"  .  Drug use: No  . Sexual activity: No  Other Topics Concern  . Not on file  Social History Narrative   He is a widower, who moved to [place] in 2011 to live closer to his daughter. He formerly lived in [place]. He is a father of 75, grandfather, and great-grandfather of 76. Does not drink alcohol, context of smoker.    Mobility: Utilizes a cane Work history: Not obtained   Allergies  Allergen Reactions  . Cyclobenzaprine Other (See Comments)    Unsteady gait    Family History  Problem Relation Age of Onset  . Cancer Mother   . Heart attack Father   . Heart attack Brother     Prior to Admission medications   Medication Sig Start Date End Date Taking? Authorizing Provider  aspirin EC 81 MG tablet Take 81 mg by mouth daily.   Yes [provider]  co-enzyme Q-10 30 MG capsule Take 30 mg by mouth daily.    Yes [provider]  diltiazem (CARDIZEM CD) 120 MG  24 hr capsule Take 1 capsule (120 mg total) by mouth daily. 05/04/17  Yes Leonie Man, MD  flecainide (TAMBOCOR) 150 MG tablet Take 1.5 tablets (225 mg total) by mouth 2 (two) times daily. 05/04/17  Yes Leonie Man, MD  HYDROcodone-acetaminophen (NORCO/VICODIN) 5-325 MG tablet Take 1 tablet by mouth every 8 (eight) hours as needed for severe pain. 05/11/17  Yes [provider]  hydrocortisone cream 1 % Apply 1 application topically 2 (two) times daily.  01/02/17  Yes [provider]  Insulin Glargine (LANTUS) 100 UNIT/ML Solostar Pen Inject 8 Units into the skin 2 (two) times daily. 11/30/16  Yes Domenic Polite, MD  loratadine (CLARITIN) 10 MG tablet Take 10 mg by mouth every evening.    Yes [provider]  metFORMIN (GLUCOPHAGE) 1000 MG tablet Take 1,000 mg by mouth 2 (two) times daily with a meal.    Yes [provider]  metoprolol succinate (TOPROL-XL) 25 MG 24 hr tablet TAKE 1/2 TABLET  (12.5 MG TOTAL) BY MOUTH DAILY. 05/04/17  Yes Leonie Man, MD  Multiple Vitamin (MULTIVITAMIN WITH MINERALS) TABS tablet Take 1 tablet by mouth daily.    Yes [provider]  nitroGLYCERIN (NITROSTAT) 0.4 MG SL tablet Place 1 tablet (0.4 mg total) under the tongue every 5 (five) minutes x 3 doses as needed for chest pain. 11/19/15  Yes Kilroy, Luke K, PA-C  oxybutynin (DITROPAN-XL) 5 MG 24 hr tablet Take 5 mg by mouth daily. 11/02/16 11/02/17 Yes [provider]  pantoprazole (PROTONIX) 40 MG tablet Take 1 tablet (40 mg total) by mouth 2 (two) times daily before a meal. 11/30/16  Yes Domenic Polite, MD  polyethylene glycol (MIRALAX / GLYCOLAX) packet Take 17 g by mouth 2 (two) times daily. Patient taking differently: Take 17 g by mouth daily as needed for moderate constipation.  10/03/16  Yes Sheikh, Omair Latif, DO  pravastatin (PRAVACHOL) 40 MG tablet Take 1 tablet (40 mg total) by mouth every evening. 05/04/17  Yes Leonie Man, MD  tamsulosin (FLOMAX) 0.4  MG CAPS capsule Take 0.4 mg by mouth daily. 02/20/17  Yes [provider]    Physical Exam: Vitals:   05/21/17 1600 05/21/17 1630 05/21/17 1700 05/21/17 1730  BP: (!) 108/46 (!) 116/53 118/75 (!) 114/45  Pulse: 86 73 83 76  Resp: _0 (!) 21  Temp:      TempSrc:  SpO2: 99% 97% 100% 95%  Weight:      Height:          Constitutional: NAD, calm, comfortable-somewhat pale in appearance Eyes: PERRL, lids normal, conjunctiva pale ENMT: Mucous membranes are moist. Posterior pharynx clear of any exudate or lesions. Poor dentition.  Neck: normal, supple, no masses, no thyromegaly Respiratory: clear to auscultation bilaterally, no wheezing, no crackles. Normal respiratory effort. No accessory muscle use.  Cardiovascular: Regular rate and rhythm, no murmurs / rubs / gallops. No extremity edema. 2+ pedal pulses. No carotid bruits.  Abdomen: no tenderness, no masses palpated. No hepatosplenomegaly. Bowel sounds positive.  Musculoskeletal: no clubbing / cyanosis. No joint deformity upper and lower extremities. Good ROM, no contractures. Normal muscle tone.  Skin: no rashes, lesions, ulcers. No induration Neurologic: CN 2-12 grossly intact. Sensation intact, DTR normal. Strength 5/5 x all 4 extremities.  Psychiatric: Normal judgment and insight. Alert and oriented x 3. Normal mood.    Labs on Admission: I have personally reviewed following labs and imaging studies  CBC: Recent Labs  Lab 05/21/17 1559  WBC 6.0  NEUTROABS 4.4  HGB 5.2*  HCT 20.1*  MCV 67.7*  PLT 563   Basic Metabolic Panel: Recent Labs  Lab 05/21/17 1559  NA 138  K 4.3  CL 107  CO2 22  GLUCOSE 105*  BUN 21*  CREATININE 0.88  CALCIUM 8.5*   GFR: Estimated Creatinine Clearance: 67.6 mL/min (by C-G formula based on SCr of 0.88 mg/dL). Liver Function Tests: Recent Labs  Lab 05/21/17 1559  AST 31  ALT 16*  ALKPHOS 73  BILITOT 0.4  PROT 6.2*  ALBUMIN 3.1*   No results for input(s):  LIPASE, AMYLASE in the last 168 hours. No results for input(s): AMMONIA in the last 168 hours. Coagulation Profile: Recent Labs  Lab 05/21/17 1559  INR 1.08   Cardiac Enzymes: No results for input(s): CKTOTAL, CKMB, CKMBINDEX, TROPONINI in the last 168 hours. BNP (last 3 results) No results for input(s): PROBNP in the last 8760 hours. HbA1C: No results for input(s): HGBA1C in the last 72 hours. CBG: No results for input(s): GLUCAP in the last 168 hours. Lipid Profile: No results for input(s): CHOL, HDL, LDLCALC, TRIG, CHOLHDL, LDLDIRECT in the last 72 hours. Thyroid Function Tests: No results for input(s): TSH, T4TOTAL, FREET4, T3FREE, THYROIDAB in the last 72 hours. Anemia Panel: Recent Labs    05/21/17 1702  RETICCTPCT 3.2*   Urine analysis:    Component Value Date/Time   COLORURINE YELLOW 04/02/2017 0133   APPEARANCEUR CLEAR 04/02/2017 0133   LABSPEC 1.015 04/02/2017 0133   PHURINE 6.0 04/02/2017 0133   GLUCOSEU NEGATIVE 04/02/2017 0133   HGBUR SMALL (A) 04/02/2017 0133   BILIRUBINUR NEGATIVE 04/02/2017 0133   KETONESUR NEGATIVE 04/02/2017 0133   PROTEINUR NEGATIVE 04/02/2017 0133   UROBILINOGEN 0.2 10/09/2014 2315   NITRITE NEGATIVE 04/02/2017 0133   LEUKOCYTESUR TRACE (A) 04/02/2017 0133   Sepsis Labs: _0 (procalcitonin:4,lacticidven:4) )No results found for this or any previous visit (from the past 240 hour(s)).   Radiological Exams on Admission: Dg Chest 2 View  Result Date: 05/21/2017 CLINICAL DATA:  Productive cough for 3 months EXAM: CHEST  2 VIEW COMPARISON:  March 09, 2017 FINDINGS: The heart size and mediastinal contours are stable. Mild increased pulmonary interstitium is identified bilaterally. There is no focal pneumonia. There are small bilateral pleural effusions. There is a questioned nodularity in the left lung base. The visualized skeletal structures are stable. IMPRESSION: Small bilateral pleural effusions.  Mild increased pulmonary  interstitium bilaterally which can be seen in mild edema or infectious inflammatory process. Nodularity in the lateral left lung base which may represent the nipple. Evaluation with frontal chest x-ray with nipple marker is recommended for confirmation. Electronically Signed   By: Abelardo Diesel M.D.   On: 05/21/2017 16:29    EKG: (Independently reviewed) sinus rhythm with first-degree AV block, ventricular rate 72 bpm, QTC 450 ms, J-point elevation in inferior lateral leads, normal R wave rotation, voltage criteria met for LVH  Assessment/Plan Principal Problem:   Symptomatic anemia/history of intermittent GI bleeding -Patient presents with generalized weakness, shortness of breath and dyspnea on exertion, bilateral arm achiness and resting and exertional chest discomfort in the context of significant heme negative anemia -Patient previously has treated for anemia with etiology felt to be secondary to intermittent GI bleeding noting EGD in September 2018 with esophagitis and heme positive stools -Followed up with hematology/[place] after that discharged and received several iron infusions as well as apparent PRBCs with hemoglobin in October 11.8 -Follow-up on anemia panel obtained in the ER -Does not take oral iron -Continues to take PPI twice daily -Suspect ongoing severe iron deficiency anemia contributing to anemia noting in December hemoglobin was 8.6 and 8.1 -Agree with transfusion 2 units PRBCs with IV Lasix 40 mg x1 in between units -CBC posttransfusion and again in a.m. -Completeness of evaluation continue to follow FOB -No thrombocytopenia but for completeness of evaluation obtain LDH and haptoglobin to rule out atypical presentation of hemolysis -Likely patient can discharge home in a.m. but will need to be referred back to hematology for further evaluation and treatment-based on his history anticipate he will require regular IV iron infusions through hematology office  Active  Problems:   HTN (hypertension) -BP somewhat suboptimal in context of significant heme-negative anemia -Hold preadmission diltiazem and metoprolol -Likely can resume in a.m. after transfusion completed and hemoglobin has increased to near baseline    Diabetes mellitus type II -Continue metformin and Lantus -SSI -HgbA1c    H/O atrial flutter -Previously on warfarin but patient opted to not resume after last admission for GI bleed and symptomatic anemia in September -Has followed with cardiology since that time and overall remained stable with rhythm and rate controlled on antiarrhythmic (Tambocor) and AVN acting agents (Cardizem and metoprolol) -Continue to follow telemetry in context of symptomatic anemia-increased risk for breakthrough tachycardia/arrhythmia    Coronary disease/nonobstructive -Last cath in 2012 -Chest discomfort and shortness of breath in context of severe symptomatic anemia -Telemetry as above -No indication at this juncture to cycle troponin or repeat echocardiogram    Diastolic CHF, chronic  -Clinically compensated -We will give Lasix in between units of blood -Not on ACE inhibitor or ARB prior to admission -Holding beta-blocker -Daily weights, strict I's/O -Echocardiogram 2016: EF 50-55%, moderate LVH, mild pulmonary regurgitation otherwise no valvular abnormality    COPD (chronic obstructive pulmonary disease)  -Stable without wheezing -Patient reports wet sounding nonproductive cough times 2 weeks-likely has mild viral pneumonitis -Provide supportive care with oxygen as needed and nebulizers    **Additional lab, imaging and/or diagnostic evaluation at discretion of supervising physician  DVT prophylaxis: SCDs Code Status: Full Family Communication: No family at bedside Disposition Plan: Home Consults called: None    Trevor Berhow L. ANP-BC [facility] Pager [phone]   If 7PM-7AM, please contact  night-coverage [email or web address] Password [facility]  05/21/2017, 6:12 PM

## 2017-05-21 NOTE — ED Notes (Signed)
Patient transported to X-ray 

## 2017-05-21 NOTE — ED Triage Notes (Signed)
Patient arrived to ED via [facility] from home. EMS reports:  Patient had been seen by PCP this morning for 3 month check up. Patient reports shortness of breath with exertion x 3 months intermittent. States "weakness" in chest.  Denies any shortness of breath at this time while at rest.  BP 130/62, Pulse 82, Resp 24, 96% on room air.  CBG 128.

## 2017-05-21 NOTE — ED Provider Notes (Signed)
[facility] EMERGENCY DEPARTMENT Provider Note   CSN: [id number] Arrival date & time: 05/21/17  1504     History   Chief Complaint Chief Complaint  Patient presents with  . Shortness of Breath    HPI [name] is a 77 y.o. male.  Patient is a 77 year old male with a history of paroxysmal atrial fibrillation status post ablation, nonocclusive coronary artery disease with catheterization in 2012 with relatively minimal disease and lexicon scan in 2015 showing no ischemia and an EF of 60%, history of anemia from unknown source but currently Coumadin is being held, COPD, diabetes, ongoing tobacco abuse presenting today with 3 months of worsening symptoms.  Patient describes it as a weakness in his chest into his throat and arms that occurs when he is active.  It started occurring 3 months ago but it has worsened.  Patient saw his cardiologist last week and discussed it with him and was told to keep an eye on it.  Patient then discussed it with his PCP today who also recommended that he keep an eye on it.  Patient states it is now occurring every time he gets up to do anything.  He states he can start even from walking from one room of his house to another.  It makes him feel like his breath is getting cut off and always improves with rest.  It is not associated with eating.  Patient does note over the last few weeks he has had a cold with some yellow and white sputum but no fever.  The cough is improving but his symptoms are not.  He occasionally will notice some mild swelling in his feet but denies any excessive weight gain, abdominal pain, nausea, vomiting.  He states he is having normal bowel movements and no issues with his urinary habits.  No syncope, palpitations.  And when asked to further describe his chest weakness he continually says it feels weak.  No recent medication changes.  Patient has been held on Coumadin since December when he had a drop in his hemoglobin of  unknown source.   The history is provided by the patient.    Past Medical History:  Diagnosis Date  . Anemia    takes Ferrous Sulfate daily  . Arthritis    "all over"  . Balance problem 01/2014  . CAP (community acquired pneumonia) 09/18/2014  . Cervical radiculopathy due to degenerative joint disease of spine   . COPD (chronic obstructive pulmonary disease) ([place])   . Coronary artery disease, non-occlusive    a. 03/2010 Nonocclusive disease by cath, performed for ST elevations on ECG;  b. 06/2013 Lexi MV: EF 60%, no ischemia.  . Diabetes mellitus type II    takes Metformin and Lantus daily  . Diastolic CHF, chronic ([place])    a. 12/2012 EF 55-60%, diast dysfxn, triv MR, mildly dil LA/RA.  [name] History of blood transfusion 1982   "when I had stomach OR"  . History of bronchitis    1998  . History of gastric ulcer   . HTN (hypertension)    takes Diltiazem daily  . Hyperlipidemia    takes Pravastatin daily  . Joint pain   . PAF (paroxysmal atrial fibrillation) ([facility])    Recurrent after atrial flutter (a. 07/2010 Status post caval tricuspid isthmus ablation by Dr. Midge Aver Metoprolol daily), currently controlled on flecainide plus diltiazem and Coumadin  . Weakness    numbness and tingling both hands    Patient Active Problem List  Diagnosis Date Noted  . Acute blood loss anemia 10/02/2016  . Abdominal pain 10/02/2016  . Constipation 10/02/2016  . Left lower quadrant pain   . Coagulopathy ([place])   . Heme positive stool   . Symptomatic anemia 07/10/2016  . Syncope 05/15/2015  . Cervical disc disease 05/15/2015  . Abnormal urinalysis 05/15/2015  . Anemia 05/15/2015  . History of CVA (cerebrovascular accident) 04/03/2014  . Near syncope 02/15/2014  . Pre-syncope 02/15/2014  . Benign neoplasm of rectum and anal canal 12/02/2013  . Upper GI bleeding 11/30/2013  . Tobacco use disorder 11/30/2013  . PAF (paroxysmal atrial fibrillation), maintaining SR; CHA2DS2Vasc = 7  08/15/2013  . Anticoagulation goal of INR 2 to 3, for PAF - CHA2DS2Vasc = 7 08/15/2013  . Type 2 diabetes mellitus ([place]) 01/22/2013  . Tobacco abuse counseling 12/15/2012  . Hyperlipidemia   . Obesity (BMI 30-39.9) 12/14/2012  . Atrial flutter - status post CTI ablation 07/29/2010  . Essential hypertension 07/29/2010  . Chronic diastolic congestive heart failure ([place]) 07/29/2010  . Coronary artery disease, non-occlusive 07/29/2010    Past Surgical History:  Procedure Laterality Date  . ATRIAL ABLATION SURGERY  08/05/10   CTI ablation for atrial flutter by JA  . CARDIAC CATHETERIZATION  2012   nl LV function, no occlusive CAD, PAF  . CARDIOVERSION  12/07/2010    Successful direct current cardioversion with atrial fibrillation to normal sinus rhythm  . CARPAL TUNNEL RELEASE Bilateral 01/30/2014   Procedure: BILATERAL CARPAL TUNNEL RELEASE;  Surgeon: Marianna Payment, MD;  Location: [facility];  Service: Orthopedics;  Laterality: Bilateral;  . CATARACT EXTRACTION W/ INTRAOCULAR LENS  IMPLANT, BILATERAL Bilateral   . COLONOSCOPY [name] 12/02/2013   Procedure: COLONOSCOPY;  Surgeon: Irene Shipper, MD;  Location: [facility];  Service: Endoscopy;  Laterality: [name];  . ESOPHAGOGASTRODUODENOSCOPY [name] 09/22/2014   Procedure: ESOPHAGOGASTRODUODENOSCOPY (EGD);  Surgeon: Ronald Lobo, MD;  Location: [facility] ENDOSCOPY;  Service: Endoscopy;  Laterality: [name];  . ESOPHAGOGASTRODUODENOSCOPY [name] 07/11/2016   Procedure: ESOPHAGOGASTRODUODENOSCOPY (EGD);  Surgeon: Doran Stabler, MD;  Location: [facility] ENDOSCOPY;  Service: Endoscopy;  Laterality: [name];  . INCISION AND DRAINAGE ABSCESS / HEMATOMA OF BURSA / KNEE / THIGH Left 1998   knee  . KNEE BURSECTOMY Left 1998  . LAPAROSCOPIC CHOLECYSTECTOMY  03/2010  . NM MYOVIEW LTD  07/22/2013   Normal EF ~60%, no ischemia or infarction.  [name] PARTIAL GASTRECTOMY  1982   subtotal; "took out 30% for ulcers"  . TRANSTHORACIC ECHOCARDIOGRAM  02/16/2014   EF 60%, no RWMA. - otherwise  normal  . YAG LASER APPLICATION Bilateral        Home Medications    Prior to Admission medications   Medication Sig Start Date End Date Taking? Authorizing Provider  aspirin EC 81 MG tablet Take 81 mg by mouth daily.    [provider]  B-D ULTRAFINE III SHORT PEN 31G X 8 MM MISC  01/03/17   [provider]  co-enzyme Q-10 30 MG capsule Take 30 mg by mouth daily.     [provider]  diltiazem (CARDIZEM CD) 120 MG 24 hr capsule Take 1 capsule (120 mg total) by mouth daily. 05/04/17   Leonie Man, MD  flecainide (TAMBOCOR) 150 MG tablet Take 1.5 tablets (225 mg total) by mouth 2 (two) times daily. 05/04/17   Leonie Man, MD  hydrocortisone cream 1 % Apply 1 application topically 2 (two) times daily.  01/02/17   [provider]  Insulin  Glargine (LANTUS) 100 UNIT/ML Solostar Pen Inject 8 Units into the skin 2 (two) times daily. 11/30/16   Domenic Polite, MD  loratadine (CLARITIN) 10 MG tablet Take 10 mg by mouth every evening.     [provider]  metFORMIN (GLUCOPHAGE) 1000 MG tablet Take 1,000 mg by mouth 2 (two) times daily with a meal.     [provider]  metoprolol succinate (TOPROL-XL) 25 MG 24 hr tablet TAKE 1/2 TABLET  (12.5 MG TOTAL) BY MOUTH DAILY. 05/04/17   Leonie Man, MD  Multiple Vitamin (MULTIVITAMIN WITH MINERALS) TABS tablet Take 1 tablet by mouth daily.     [provider]  nitroGLYCERIN (NITROSTAT) 0.4 MG SL tablet Place 1 tablet (0.4 mg total) under the tongue every 5 (five) minutes x 3 doses as needed for chest pain. 11/19/15   Erlene Quan, PA-C  oxybutynin (DITROPAN-XL) 5 MG 24 hr tablet Take 5 mg by mouth daily. 11/02/16 11/02/17  [provider]  oxyCODONE-acetaminophen (PERCOCET/ROXICET) 5-325 MG tablet Take 1 tablet by mouth every 4 (four) hours as needed for severe pain. 04/02/17   Molpus, Jenny Reichmann, MD  pantoprazole (PROTONIX) 40 MG tablet Take 1 tablet (40 mg total) by mouth 2 (two) times  daily before a meal. 11/30/16   Domenic Polite, MD  polyethylene glycol [facility] / Floria Raveling) packet Take 17 g by mouth 2 (two) times daily. Patient taking differently: Take 17 g by mouth daily as needed for moderate constipation.  10/03/16   Raiford Noble Latif, DO  pravastatin (PRAVACHOL) 40 MG tablet Take 1 tablet (40 mg total) by mouth every evening. 05/04/17   Leonie Man, MD  tamsulosin (FLOMAX) 0.4 MG CAPS capsule Take 0.4 mg by mouth daily. 02/20/17   [provider]    Family History Family History  Problem Relation Age of Onset  . Cancer Mother   . Heart attack Father   . Heart attack Brother     Social History Social History   Tobacco Use  . Smoking status: Current Every Day Smoker    Packs/day: 0.50    Years: 55.00    Pack years: 27.50    Types: Cigarettes  . Smokeless tobacco: Never Used  . Tobacco comment: He's been smoking between 0.5 and 2 ppd since age 64.  Substance Use Topics  . Alcohol use: No    Alcohol/week: 0.0 oz    Comment: "quit drinking in 1986"  . Drug use: No     Allergies   Cyclobenzaprine   Review of Systems Review of Systems  All other systems reviewed and are negative.    Physical Exam Updated Vital Signs BP (!) 125/51 (BP Location: Right Arm)   Pulse 78   Temp 98.2 F (36.8 C) (Oral)   Resp 19   Ht 5\' 6"  (1.676 m)   Wt 77.1 kg (170 lb)   SpO2 93%   BMI 27.44 kg/m   Physical Exam  Constitutional: He is oriented to person, place, and time. He appears well-developed and well-nourished. No distress.  HENT:  Head: Normocephalic and atraumatic.  Mouth/Throat: Oropharynx is clear and moist.  Eyes: Conjunctivae and EOM are normal. Pupils are equal, round, and reactive to light.  Pale conjunctive a  Neck: Normal range of motion. Neck supple.  Cardiovascular: Normal rate, regular rhythm and intact distal pulses.  No murmur heard. Pulmonary/Chest: Effort normal and breath sounds normal. No tachypnea. No respiratory  distress. He has no decreased breath sounds. He has no wheezes. He  has no rhonchi. He has no rales.  Abdominal: Soft. He exhibits no distension. There is no tenderness. There is no rebound and no guarding.  Musculoskeletal: Normal range of motion. He exhibits no tenderness.       Right lower leg: He exhibits edema.       Left lower leg: He exhibits edema.  Trace pitting edema in bilateral feet  Neurological: He is alert and oriented to person, place, and time.  Skin: Skin is warm and dry. No rash noted. No erythema. There is pallor.  Psychiatric: He has a normal mood and affect. His behavior is normal.  Nursing note and vitals reviewed.    ED Treatments / Results  Labs (all labs ordered are listed, but only abnormal results are displayed) Labs Reviewed  CBC WITH DIFFERENTIAL/PLATELET - Abnormal; Notable for the following components:      Result Value   RBC 2.97 (*)    Hemoglobin 5.2 (*)    HCT 20.1 (*)    MCV 67.7 (*)    MCH 17.5 (*)    MCHC 25.9 (*)    RDW 19.2 (*)    Lymphs Abs 0.6 (*)    All other components within normal limits  COMPREHENSIVE METABOLIC PANEL - Abnormal; Notable for the following components:   Glucose, Bld 105 (*)    BUN 21 (*)    Calcium 8.5 (*)    Total Protein 6.2 (*)    Albumin 3.1 (*)    ALT 16 (*)    All other components within normal limits  BRAIN NATRIURETIC PEPTIDE - Abnormal; Notable for the following components:   B Natriuretic Peptide 245.7 (*)    All other components within normal limits  PROTIME-INR  VITAMIN B12  FOLATE  IRON AND TIBC  FERRITIN  RETICULOCYTES  I-STAT TROPONIN, ED  POC OCCULT BLOOD, ED  TYPE AND SCREEN  PREPARE RBC (CROSSMATCH)    EKG  EKG Interpretation  Date/Time:  Monday May 21 2017 16:35:56 EST Ventricular Rate:  72 PR Interval:    QRS Duration: 98 QT Interval:  411 QTC Calculation: 450 R Axis:   -11 Text Interpretation:  Sinus or ectopic atrial rhythm Prolonged PR interval Anteroseptal infarct,  age indeterminate Minimal ST elevation, inferior leads No significant change since last tracing Confirmed by [name] [phone]) on 05/21/2017 4:56:40 PM       Radiology Dg Chest 2 View  Result Date: 05/21/2017 CLINICAL DATA:  Productive cough for 3 months EXAM: CHEST  2 VIEW COMPARISON:  March 09, 2017 FINDINGS: The heart size and mediastinal contours are stable. Mild increased pulmonary interstitium is identified bilaterally. There is no focal pneumonia. There are small bilateral pleural effusions. There is a questioned nodularity in the left lung base. The visualized skeletal structures are stable. IMPRESSION: Small bilateral pleural effusions. Mild increased pulmonary interstitium bilaterally which can be seen in mild edema or infectious inflammatory process. Nodularity in the lateral left lung base which may represent the nipple. Evaluation with frontal chest x-ray with nipple marker is recommended for confirmation. Electronically Signed   By: Abelardo Diesel M.D.   On: 05/21/2017 16:29    Procedures Procedures (including critical care time)  Medications Ordered in ED Medications - No data to display   Initial Impression / Assessment and Plan / ED Course  I have reviewed the triage vital signs and the nursing notes.  Pertinent labs & imaging results that were available during my care of the patient were reviewed by me and considered  in my medical decision making (see chart for details).    Elderly gentleman presenting today with symptoms of weakness in his chest and arms with exertion.  Patient has multiple medical problems including anemia, mild nonocclusive coronary artery disease and prior ablation for atrial fibrillation.  Patient is not currently anticoagulated because between October and December of last year he had a 3 g drop in his hemoglobin.  This is unknown why this occurred.  Concerned that patient could be having worsening anemia which would be causing symptoms of  exertional angina.  Also concern for possible coronary artery disease causing angina which he describes as weakness.  It is exertional.  He also has a cough for the last 3 weeks with some intermittent sputum production but denies any fever or shortness of breath except with exertion when he gets this weakness in his chest.  Lower suspicion for pneumonia patient is not wheezing on exam or hypoxic to suggest a COPD exacerbation.  Lower suspicion for PE as he has not been immobile and denies any unilateral pain or swelling in the leg.  Patient does not appear to be extensively fluid overloaded suggesting CHF exacerbation. CBC, CMP, troponin, BNP, PT/INR, chest x-ray, EKG pending.  Patient's vital signs are reassuring at this time.  5:16 PM Patient's labs are significant for hemoglobin of 5.2 which is most likely the cause of the symptoms he is describing when he ambulates.  Patient is free at rest.  Hemoccult was negative and anemia panel was sent.  CMP without significant findings and troponin is negative.  PT/INR within normal limits and BNP slightly elevated at 250.  Patient's chest x-ray shows small bilateral pleural effusions, mild increased pulmonary interstitium bilaterally which can be seen with mild edema or an inflammatory process which could be patient's recent cough may be mild bronchitis.  However patient is not wheezing has clear breath sounds here.  Will admit for blood transfusion.  Will follow with Lasix.  CRITICAL CARE Performed by: Aleczander Fandino Total critical care time: 30 minutes Critical care time was exclusive of separately billable procedures and treating other patients. Critical care was necessary to treat or prevent imminent or life-threatening deterioration. Critical care was time spent personally by me on the following activities: development of treatment plan with patient and/or surrogate as well as nursing, discussions with consultants, evaluation of patient's response to  treatment, examination of patient, obtaining history from patient or surrogate, ordering and performing treatments and interventions, ordering and review of laboratory studies, ordering and review of radiographic studies, pulse oximetry and re-evaluation of patient's condition.   Final Clinical Impressions(s) / ED Diagnoses   Final diagnoses:  Symptomatic anemia    ED Discharge Orders    None       [name], MD 05/21/17 1718

## 2017-05-22 ENCOUNTER — Other Ambulatory Visit: Payer: Self-pay

## 2017-05-22 DIAGNOSIS — D649 Anemia, unspecified: Secondary | ICD-10-CM | POA: Diagnosis not present

## 2017-05-22 DIAGNOSIS — I10 Essential (primary) hypertension: Secondary | ICD-10-CM | POA: Diagnosis not present

## 2017-05-22 LAB — HEMOGLOBIN A1C
HEMOGLOBIN A1C: 5.7 % — AB (ref 4.8–5.6)
MEAN PLASMA GLUCOSE: 117 mg/dL

## 2017-05-22 LAB — CBC
HEMATOCRIT: 27.7 % — AB (ref 39.0–52.0)
Hemoglobin: 8 g/dL — ABNORMAL LOW (ref 13.0–17.0)
MCH: 20.6 pg — ABNORMAL LOW (ref 26.0–34.0)
MCHC: 28.9 g/dL — ABNORMAL LOW (ref 30.0–36.0)
MCV: 71.4 fL — AB (ref 78.0–100.0)
Platelets: 292 10*3/uL (ref 150–400)
RBC: 3.88 MIL/uL — AB (ref 4.22–5.81)
RDW: 20.6 % — ABNORMAL HIGH (ref 11.5–15.5)
WBC: 5.2 10*3/uL (ref 4.0–10.5)

## 2017-05-22 LAB — BASIC METABOLIC PANEL
Anion gap: 11 (ref 5–15)
BUN: 18 mg/dL (ref 6–20)
CHLORIDE: 104 mmol/L (ref 101–111)
CO2: 24 mmol/L (ref 22–32)
Calcium: 8.8 mg/dL — ABNORMAL LOW (ref 8.9–10.3)
Creatinine, Ser: 0.83 mg/dL (ref 0.61–1.24)
GFR calc Af Amer: >60 mL/min (ref >60)
Glucose, Bld: 114 mg/dL — ABNORMAL HIGH (ref 65–99)
POTASSIUM: 3.8 mmol/L (ref 3.5–5.1)
SODIUM: 139 mmol/L (ref 135–145)

## 2017-05-22 LAB — HAPTOGLOBIN: HAPTOGLOBIN: 219 mg/dL — AB (ref 34–200)

## 2017-05-22 LAB — GLUCOSE, CAPILLARY
GLUCOSE-CAPILLARY: 138 mg/dL — AB (ref 65–99)
Glucose-Capillary: 120 mg/dL — ABNORMAL HIGH (ref 65–99)

## 2017-05-22 MED ORDER — DILTIAZEM HCL ER COATED BEADS 120 MG PO CP24: 120.0000 mg | ORAL_CAPSULE | Freq: Every day | ORAL | Status: DC

## 2017-05-22 MED ORDER — METOPROLOL SUCCINATE ER 25 MG PO TB24: 12.5000 mg | ORAL_TABLET | Freq: Every day | ORAL | Status: DC

## 2017-05-22 NOTE — Progress Notes (Signed)
[name] to be D/C'd home per MD order. Discussed with the patient and all questions fully answered. VVS, Skin clean, dry and intact without evidence of skin break down, no evidence of skin tears noted.  IV catheter discontinued intact. Site without signs and symptoms of complications. Dressing and pressure applied.  An After Visit Summary was printed and given to the patient.  Patient escorted via stretcher, and D/C home via [facility].  Melonie Florida  05/22/2017 4:17 PM

## 2017-05-22 NOTE — Discharge Summary (Signed)
Physician Discharge Summary  [name] [employer]:[id number] DOB: December 01, 1940 DOA: 05/21/2017  PCP: [name], MD  Admit date: 05/21/2017 Discharge date: 05/22/2017   Recommendations for Outpatient Follow-Up:   1. Patient needs close hematology follow up-- PRN IV Fe as well as PRN PRBC--- ? Bone marrow 2. U/a at next PCP visit   Discharge Diagnosis:   Principal Problem:   Symptomatic anemia Active Problems:   HTN (hypertension)   Diabetes mellitus type II   Diastolic CHF, chronic (HCC)   COPD (chronic obstructive pulmonary disease) ([place])   H/O atrial flutter   Coronary disease/nonobstructive   Discharge disposition:  Home. :  Discharge Condition: Improved.  Diet recommendation: Low sodium, heart healthy  Wound care: None.   History of Present Illness:   77 year old male with a history of coronary artery disease, a flutter status post ablation, paroxysmal A. fib, COPD, ongoing tobacco abuse, diabetes as well as a history of being on Coumadin which was discontinued when he had a GI bleed last year, presents to the hospital with chief complaint of progressive shortness of breath and weakness over the last 5-6 weeks.  He reports that he gets very short winded with minimal activity.  He denies any bleeding, any blood in his stools or dark tarry stools.  He was recently seen by cardiologist regarding his weakness with no change in his medication.  Of note, in the setting of presumed occult GI bleed in 2018 he was followed by oncology requiring IV iron infusions as well as intermittent blood transfusions as an outpatient.  In the ED he was found to have profound anemia with a hemoglobin of 5, his fecal occult was negative.  We were asked to admit for blood transfusions and further evaluation.   Hospital Course by Problem:   Anemia- Fe def -s/p 2 units -h/h stable -needs close outpatient follow up and consideration of bone marrow biopsy-- stools are heme negative, ?  Urinary source -U/A at next visit  All other chronic issues are stable    Medical Consultants:    None.   Discharge Exam:   Vitals:   05/22/17 0455 05/22/17 0552  BP: 127/70 (!) 124/59  Pulse: 81 75  Resp: 15 14  Temp: 97.8 F (36.6 C) (!) 97.4 F (36.3 C)  SpO2: 98% 95%   Vitals:   05/22/17 0303 05/22/17 0318 05/22/17 0455 05/22/17 0552  BP: (!) 129/53 (!) 122/51 127/70 (!) 124/59  Pulse: 78 78 81 75  Resp: 14 14 15 14   Temp: 97.9 F (36.6 C) 97.7 F (36.5 C) 97.8 F (36.6 C) (!) 97.4 F (36.3 C)  TempSrc: Oral Oral Oral Oral  SpO2: 93% 95% 98% 95%  Weight:   77.3 kg (170 lb 6.7 oz)   Height:        Gen:  NAD- breathing and feeling much better   The results of significant diagnostics from this hospitalization (including imaging, microbiology, ancillary and laboratory) are listed below for reference.     Procedures and Diagnostic Studies:   Dg Chest 2 View  Result Date: 05/21/2017 CLINICAL DATA:  Productive cough for 3 months EXAM: CHEST  2 VIEW COMPARISON:  March 09, 2017 FINDINGS: The heart size and mediastinal contours are stable. Mild increased pulmonary interstitium is identified bilaterally. There is no focal pneumonia. There are small bilateral pleural effusions. There is a questioned nodularity in the left lung base. The visualized skeletal structures are stable. IMPRESSION: Small bilateral pleural effusions. Mild increased pulmonary interstitium bilaterally  which can be seen in mild edema or infectious inflammatory process. Nodularity in the lateral left lung base which may represent the nipple. Evaluation with frontal chest x-ray with nipple marker is recommended for confirmation. Electronically Signed   By: Abelardo Diesel M.D.   On: 05/21/2017 16:29     Labs:   Basic Metabolic Panel: Recent Labs  Lab 05/21/17 1559 05/22/17 0849  NA 138 139  K 4.3 3.8  CL 107 104  CO2 22 24  GLUCOSE 105* 114*  BUN 21* 18  CREATININE 0.88 0.83  CALCIUM  8.5* 8.8*   GFR Estimated Creatinine Clearance: 71.8 mL/min (by C-G formula based on SCr of 0.83 mg/dL). Liver Function Tests: Recent Labs  Lab 05/21/17 1559  AST 31  ALT 16*  ALKPHOS 73  BILITOT 0.4  PROT 6.2*  ALBUMIN 3.1*   No results for input(s): LIPASE, AMYLASE in the last 168 hours. No results for input(s): AMMONIA in the last 168 hours. Coagulation profile Recent Labs  Lab 05/21/17 1559  INR 1.08    CBC: Recent Labs  Lab 05/21/17 1559 05/22/17 0849  WBC 6.0 5.2  NEUTROABS 4.4  --   HGB 5.2* 8.0*  HCT 20.1* 27.7*  MCV 67.7* 71.4*  PLT 291 292   Cardiac Enzymes: No results for input(s): CKTOTAL, CKMB, CKMBINDEX, TROPONINI in the last 168 hours. BNP: Invalid input(s): POCBNP CBG: Recent Labs  Lab 05/21/17 2029 05/22/17 0809 05/22/17 1225  GLUCAP 149* 120* 138*   D-Dimer No results for input(s): DDIMER in the last 72 hours. Hgb A1c Recent Labs    05/21/17 1711  HGBA1C 5.7*   Lipid Profile No results for input(s): CHOL, HDL, LDLCALC, TRIG, CHOLHDL, LDLDIRECT in the last 72 hours. Thyroid function studies No results for input(s): TSH, T4TOTAL, T3FREE, THYROIDAB in the last 72 hours.  Invalid input(s): FREET3 Anemia work up Recent Labs    05/21/17 1702  VITAMINB12 611  FOLATE 66.0  FERRITIN 4*  TIBC 475*  IRON 13*  RETICCTPCT 3.2*   Microbiology No results found for this or any previous visit (from the past 240 hour(s)).   Discharge Instructions:   Discharge Instructions    Diet - low sodium heart healthy   Complete by:  As directed    Diet Carb Modified   Complete by:  As directed    Discharge instructions   Complete by:  As directed    Close follow up with hematology ([facility]) for IV fe and PRN transfusions   Increase activity slowly   Complete by:  As directed      Allergies as of 05/22/2017      Reactions   Cyclobenzaprine Other (See Comments)   Unsteady gait      Medication List    TAKE these medications     aspirin EC 81 MG tablet Take 81 mg by mouth daily.   co-enzyme Q-10 30 MG capsule Take 30 mg by mouth daily.   diltiazem 120 MG 24 hr capsule Commonly known as:  CARDIZEM CD Take 1 capsule (120 mg total) by mouth daily.   flecainide 150 MG tablet Commonly known as:  TAMBOCOR Take 1.5 tablets (225 mg total) by mouth 2 (two) times daily.   HYDROcodone-acetaminophen 5-325 MG tablet Commonly known as:  NORCO/VICODIN Take 1 tablet by mouth every 8 (eight) hours as needed for severe pain.   hydrocortisone cream 1 % Apply 1 application topically 2 (two) times daily.   Insulin Glargine 100 UNIT/ML Solostar Pen Commonly known as:  LANTUS Inject 8 Units into the skin 2 (two) times daily.   loratadine 10 MG tablet Commonly known as:  CLARITIN Take 10 mg by mouth every evening.   metFORMIN 1000 MG tablet Commonly known as:  GLUCOPHAGE Take 1,000 mg by mouth 2 (two) times daily with a meal.   metoprolol succinate 25 MG 24 hr tablet Commonly known as:  TOPROL-XL TAKE 1/2 TABLET  (12.5 MG TOTAL) BY MOUTH DAILY.   multivitamin with minerals Tabs tablet Take 1 tablet by mouth daily.   nitroGLYCERIN 0.4 MG SL tablet Commonly known as:  NITROSTAT Place 1 tablet (0.4 mg total) under the tongue every 5 (five) minutes x 3 doses as needed for chest pain.   oxybutynin 5 MG 24 hr tablet Commonly known as:  DITROPAN-XL Take 5 mg by mouth daily.   pantoprazole 40 MG tablet Commonly known as:  PROTONIX Take 1 tablet (40 mg total) by mouth 2 (two) times daily before a meal.   polyethylene glycol packet Commonly known as:  MIRALAX / GLYCOLAX Take 17 g by mouth 2 (two) times daily. What changed:    when to take this  reasons to take this   pravastatin 40 MG tablet Commonly known as:  PRAVACHOL Take 1 tablet (40 mg total) by mouth every evening.   tamsulosin 0.4 MG Caps capsule Commonly known as:  FLOMAX Take 0.4 mg by mouth daily.      Follow-up Information    Volanda Napoleon, MD Follow up.   Specialty:  Oncology Why:  have left message with scheduler for appointment to be scheduled Contact information: [address] [place] [address] [phone]        [name], MD Follow up in 1 week(s).   Specialty:  Family Medicine Contact information: [place] [address] [phone]            Time coordinating discharge: 35 min  Signed:  Geradine Girt   [facility] 05/22/2017, 2:02 PM

## 2017-05-22 NOTE — Care Management Obs Status (Signed)
[facility] NOTIFICATION   Patient Details  Name: [name] MRN: [id number] Date of Birth: 1940/07/09   Medicare Observation Status Notification Given:  Yes    Sharin Mons, RN 05/22/2017, 3:44 PM

## 2017-05-23 LAB — BPAM RBC
Blood Product Expiration Date: 201903132359
Blood Product Expiration Date: 201903192359
ISSUE DATE / TIME: 201902252258
ISSUE DATE / TIME: 201902260250
Unit Type and Rh: 600
Unit Type and Rh: 600

## 2017-05-23 LAB — TYPE AND SCREEN
ABO/RH(D): A NEG
Antibody Screen: NEGATIVE
Unit division: 0
Unit division: 0

## 2017-05-30 ENCOUNTER — Inpatient Hospital Stay: Payer: Medicare Other | Attending: Hematology & Oncology

## 2017-05-30 ENCOUNTER — Inpatient Hospital Stay (HOSPITAL_BASED_OUTPATIENT_CLINIC_OR_DEPARTMENT_OTHER): Payer: Medicare Other | Admitting: Family

## 2017-05-30 VITALS — BP 123/52 | HR 76 | Temp 97.5°F | Resp 16 | Wt 176.5 lb

## 2017-05-30 DIAGNOSIS — N3000 Acute cystitis without hematuria: Secondary | ICD-10-CM

## 2017-05-30 DIAGNOSIS — D631 Anemia in chronic kidney disease: Secondary | ICD-10-CM

## 2017-05-30 DIAGNOSIS — Z79899 Other long term (current) drug therapy: Secondary | ICD-10-CM | POA: Insufficient documentation

## 2017-05-30 DIAGNOSIS — Z87442 Personal history of urinary calculi: Secondary | ICD-10-CM | POA: Diagnosis not present

## 2017-05-30 DIAGNOSIS — Z7982 Long term (current) use of aspirin: Secondary | ICD-10-CM

## 2017-05-30 DIAGNOSIS — Z794 Long term (current) use of insulin: Secondary | ICD-10-CM | POA: Diagnosis not present

## 2017-05-30 DIAGNOSIS — D500 Iron deficiency anemia secondary to blood loss (chronic): Secondary | ICD-10-CM | POA: Insufficient documentation

## 2017-05-30 DIAGNOSIS — M545 Low back pain: Secondary | ICD-10-CM | POA: Insufficient documentation

## 2017-05-30 DIAGNOSIS — N189 Chronic kidney disease, unspecified: Secondary | ICD-10-CM

## 2017-05-30 LAB — CBC WITH DIFFERENTIAL (CANCER CENTER ONLY)
BASOS PCT: 1 %
Basophils Absolute: 0.1 10*3/uL (ref 0.0–0.1)
Eosinophils Absolute: 0.2 10*3/uL (ref 0.0–0.5)
Eosinophils Relative: 4 %
HEMATOCRIT: 27.8 % — AB (ref 38.7–49.9)
HEMOGLOBIN: 7.6 g/dL — AB (ref 13.0–17.1)
LYMPHS ABS: 1 10*3/uL (ref 0.9–3.3)
Lymphocytes Relative: 16 %
MCH: 20.1 pg — AB (ref 28.0–33.4)
MCHC: 27.3 g/dL — AB (ref 32.0–35.9)
MCV: 73.5 fL — ABNORMAL LOW (ref 82.0–98.0)
Monocytes Absolute: 0.8 10*3/uL (ref 0.1–0.9)
Monocytes Relative: 12 %
NEUTROS ABS: 4.3 10*3/uL (ref 1.5–6.5)
Neutrophils Relative %: 67 %
Platelet Count: 311 10*3/uL (ref 145–400)
RBC: 3.78 MIL/uL — AB (ref 4.20–5.70)
RDW: 22.2 % — ABNORMAL HIGH (ref 11.1–15.7)
WBC: 6.4 10*3/uL (ref 4.0–10.0)

## 2017-05-30 LAB — URINALYSIS, COMPLETE (UACMP) WITH MICROSCOPIC
BILIRUBIN URINE: NEGATIVE
Glucose, UA: NEGATIVE mg/dL
HGB URINE DIPSTICK: NEGATIVE
KETONES UR: NEGATIVE mg/dL
Leukocytes, UA: NEGATIVE
NITRITE: NEGATIVE
PROTEIN: NEGATIVE mg/dL
RBC / HPF: NONE SEEN RBC/hpf (ref 0–5)
Specific Gravity, Urine: 1.02 (ref 1.005–1.030)
pH: 6 (ref 5.0–8.0)

## 2017-05-30 LAB — IRON AND TIBC
Iron: 14 ug/dL — ABNORMAL LOW (ref 42–163)
Saturation Ratios: 4 % — ABNORMAL LOW (ref 42–163)
TIBC: 396 ug/dL (ref 202–409)
UIBC: 382 ug/dL

## 2017-05-30 LAB — RETICULOCYTES
RBC.: 3.87 MIL/uL — AB (ref 4.20–5.82)
RETIC COUNT ABSOLUTE: 58.1 10*3/uL (ref 34.8–93.9)
Retic Ct Pct: 1.5 % (ref 0.8–1.8)

## 2017-05-30 LAB — FERRITIN: Ferritin: 7 ng/mL — ABNORMAL LOW (ref 22–316)

## 2017-05-30 NOTE — Progress Notes (Signed)
Hematology and Oncology Follow Up Visit  [name] [id number] [birth year] 77 y.o. 05/30/2017   Principle Diagnosis:  Iron deficiency anemia secondary to intermittent GI bleed   Current Therapy:   IV iron as indicated Supportive transfusions as needed   Interim History:  [name] is here today with his daughter for follow-up. He was hospitalized 2 weeks ago with symptomatic anemia, Hgb 5.4. He received 2 units of blood during admission. He states that he is feeling better but still fatigued.  His ferritin was 4 and iron saturation 3% at that time. This has not been replaced yet.  He has had no bleeding, no bruising or petechiae. Stool was negative.  His Coumadin was stopped.  He has had some lower back pan and pressure. He states that he has history of kidney stones and has been drinking cranberry juice. He feels a little better after he urinates.  No fever, chills, n/v, cough, rash, dizziness, SOB, chest pain, palpitations, abdominal pain or changes in bowel or bladder habits.  No swelling, tenderness, numbness or tingling in his extremities at this time.  No lymphadenopathy found on exam.  He is smoking < 1 ppd now. He is trying to quit.  He has a better appetite and is hydrating well. His weight is stable.   ECOG Performance Status: 1 - Symptomatic but completely ambulatory  Medications:  Allergies as of 05/30/2017      Reactions   Cyclobenzaprine Other (See Comments)   Unsteady gait      Medication List        Accurate as of 05/30/17  9:43 AM. Always use your most recent med list.          aspirin EC 81 MG tablet Take 81 mg by mouth daily.   co-enzyme Q-10 30 MG capsule Take 30 mg by mouth daily.   diltiazem 120 MG 24 hr capsule Commonly known as:  CARDIZEM CD Take 1 capsule (120 mg total) by mouth daily.   flecainide 150 MG tablet Commonly known as:  TAMBOCOR Take 1.5 tablets (225 mg total) by mouth 2 (two) times daily.   HYDROcodone-acetaminophen 5-325  MG tablet Commonly known as:  NORCO/VICODIN Take 1 tablet by mouth every 8 (eight) hours as needed for severe pain.   hydrocortisone cream 1 % Apply 1 application topically 2 (two) times daily.   Insulin Glargine 100 UNIT/ML Solostar Pen Commonly known as:  LANTUS Inject 8 Units into the skin 2 (two) times daily.   loratadine 10 MG tablet Commonly known as:  CLARITIN Take 10 mg by mouth every evening.   metFORMIN 1000 MG tablet Commonly known as:  GLUCOPHAGE Take 1,000 mg by mouth 2 (two) times daily with a meal.   metoprolol succinate 25 MG 24 hr tablet Commonly known as:  TOPROL-XL TAKE 1/2 TABLET  (12.5 MG TOTAL) BY MOUTH DAILY.   multivitamin with minerals Tabs tablet Take 1 tablet by mouth daily.   nitroGLYCERIN 0.4 MG SL tablet Commonly known as:  NITROSTAT Place 1 tablet (0.4 mg total) under the tongue every 5 (five) minutes x 3 doses as needed for chest pain.   oxybutynin 5 MG 24 hr tablet Commonly known as:  DITROPAN-XL Take 5 mg by mouth daily.   pantoprazole 40 MG tablet Commonly known as:  PROTONIX Take 1 tablet (40 mg total) by mouth 2 (two) times daily before a meal.   polyethylene glycol packet Commonly known as:  MIRALAX / GLYCOLAX Take 17 g by mouth 2 (two)  times daily.   pravastatin 40 MG tablet Commonly known as:  PRAVACHOL Take 1 tablet (40 mg total) by mouth every evening.   tamsulosin 0.4 MG Caps capsule Commonly known as:  FLOMAX Take 0.4 mg by mouth daily.       Allergies:  Allergies  Allergen Reactions  . Cyclobenzaprine Other (See Comments)    Unsteady gait    Past Medical History, Surgical history, Social history, and Family History were reviewed and updated.  Review of Systems: All other 10 point review of systems is negative.   Physical Exam:  vitals were not taken for this visit.   Wt Readings from Last 3 Encounters:  05/22/17 170 lb 6.7 oz (77.3 kg)  05/04/17 171 lb (77.6 kg)  03/11/17 160 lb (72.6 kg)    Ocular:  Sclerae unicteric, pupils equal, round and reactive to light Ear-nose-throat: Oropharynx clear, dentition fair Lymphatic: No cervical, supraclavicular or axillary adenopathy Lungs no rales or rhonchi, good excursion bilaterally Heart regular rate and rhythm, no murmur appreciated Abd soft, nontender, positive bowel sounds, no liver or spleen tip palpated on exam, no fluid wave  MSK no focal spinal tenderness, no joint edema Neuro: non-focal, well-oriented, appropriate affect Breasts: Deferred   Lab Results  Component Value Date   WBC 6.4 05/30/2017   HGB 8.0 (L) 05/22/2017   HCT 27.8 (L) 05/30/2017   MCV 73.5 (L) 05/30/2017   PLT 311 05/30/2017   Lab Results  Component Value Date   FERRITIN 4 (L) 05/21/2017   IRON 13 (L) 05/21/2017   TIBC 475 (H) 05/21/2017   UIBC 462 05/21/2017   IRONPCTSAT 3 (L) 05/21/2017   Lab Results  Component Value Date   RETICCTPCT 3.2 (H) 05/21/2017   RBC 3.78 (L) 05/30/2017   No results found for: KPAFRELGTCHN, LAMBDASER, KAPLAMBRATIO No results found for: IGGSERUM, IGA, IGMSERUM No results found for: Odetta Pink, SPEI   Chemistry      Component Value Date/Time   NA 139 05/22/2017 0849   NA 139 10/20/2016 1110   K 3.8 05/22/2017 0849   K 4.0 10/20/2016 1110   CL 104 05/22/2017 0849   CO2 24 05/22/2017 0849   CO2 26 10/20/2016 1110   BUN 18 05/22/2017 0849   BUN 15.2 10/20/2016 1110   CREATININE 0.83 05/22/2017 0849   CREATININE 0.8 10/20/2016 1110      Component Value Date/Time   CALCIUM 8.8 (L) 05/22/2017 0849   CALCIUM 9.6 10/20/2016 1110   ALKPHOS 73 05/21/2017 1559   ALKPHOS 85 10/20/2016 1110   AST 31 05/21/2017 1559   AST 33 10/20/2016 1110   ALT 16 (L) 05/21/2017 1559   ALT 42 10/20/2016 1110   BILITOT 0.4 05/21/2017 1559   BILITOT <0.22 10/20/2016 1110      Impression and Plan: [name] is a very pleasant 77 yo caucasian gentleman with anemia secondary to  intermittent GI blood loss and malabsorption on Protonix.  He was hospitalized 2 weeks ago with symptomatic anemia and given 2 units of blood. He is still having some fatigue but feels better.  We will replace his iron with infusion tomorrow and again next week.  We will see what his UA shows and get him on an antibiotic if needed.  We will plan to see him back in another month for follow-up.  Both he and his daughter know to contact our office with any questions or concerns. We can certainly see her sooner if need  be.   Laverna Peace, NP 3/6/20199:43 AM

## 2017-05-31 ENCOUNTER — Inpatient Hospital Stay: Payer: Medicare Other

## 2017-05-31 ENCOUNTER — Other Ambulatory Visit: Payer: Self-pay | Admitting: Family

## 2017-05-31 VITALS — BP 110/45 | HR 72 | Temp 97.7°F | Resp 20

## 2017-05-31 DIAGNOSIS — N3000 Acute cystitis without hematuria: Secondary | ICD-10-CM

## 2017-05-31 DIAGNOSIS — Z79899 Other long term (current) drug therapy: Secondary | ICD-10-CM | POA: Diagnosis not present

## 2017-05-31 DIAGNOSIS — Z7982 Long term (current) use of aspirin: Secondary | ICD-10-CM | POA: Diagnosis not present

## 2017-05-31 DIAGNOSIS — M545 Low back pain: Secondary | ICD-10-CM | POA: Diagnosis not present

## 2017-05-31 DIAGNOSIS — Z794 Long term (current) use of insulin: Secondary | ICD-10-CM | POA: Diagnosis not present

## 2017-05-31 DIAGNOSIS — Z87442 Personal history of urinary calculi: Secondary | ICD-10-CM | POA: Diagnosis not present

## 2017-05-31 DIAGNOSIS — D508 Other iron deficiency anemias: Secondary | ICD-10-CM

## 2017-05-31 DIAGNOSIS — D500 Iron deficiency anemia secondary to blood loss (chronic): Secondary | ICD-10-CM | POA: Diagnosis not present

## 2017-05-31 LAB — ERYTHROPOIETIN: Erythropoietin: 148.9 m[IU]/mL — ABNORMAL HIGH (ref 2.6–18.5)

## 2017-05-31 MED ORDER — SODIUM CHLORIDE 0.9 % IV SOLN
510.0000 mg | Freq: Once | INTRAVENOUS | Status: AC
  Administered 2017-05-31: 510 mg via INTRAVENOUS
  Filled 2017-05-31: qty 17

## 2017-05-31 MED ORDER — SODIUM CHLORIDE 0.9 % IV SOLN
Freq: Once | INTRAVENOUS | Status: AC
  Administered 2017-05-31: 09:00:00 via INTRAVENOUS

## 2017-05-31 NOTE — Patient Instructions (Signed)

## 2017-06-01 ENCOUNTER — Other Ambulatory Visit: Payer: Medicare Other

## 2017-06-03 LAB — URINE CULTURE

## 2017-06-04 ENCOUNTER — Other Ambulatory Visit: Payer: Self-pay | Admitting: Family

## 2017-06-04 DIAGNOSIS — N3000 Acute cystitis without hematuria: Secondary | ICD-10-CM

## 2017-06-04 MED ORDER — SULFAMETHOXAZOLE-TRIMETHOPRIM 800-160 MG PO TABS: 1.0000 | ORAL_TABLET | Freq: Two times a day (BID) | ORAL | 0 refills | Status: DC

## 2017-06-05 ENCOUNTER — Telehealth: Payer: Self-pay

## 2017-06-05 NOTE — Telephone Encounter (Signed)
Culture positive for UTI. Script for Bactrim (BID for 3 days) sent to his [facility]. Thank you!   Judson Roch   Above message left on pt's VM. dph

## 2017-06-07 ENCOUNTER — Other Ambulatory Visit: Payer: Self-pay

## 2017-06-07 ENCOUNTER — Inpatient Hospital Stay: Payer: Medicare Other

## 2017-06-07 VITALS — BP 117/54 | HR 75 | Temp 98.1°F | Resp 18

## 2017-06-07 DIAGNOSIS — Z79899 Other long term (current) drug therapy: Secondary | ICD-10-CM | POA: Diagnosis not present

## 2017-06-07 DIAGNOSIS — M545 Low back pain: Secondary | ICD-10-CM | POA: Diagnosis not present

## 2017-06-07 DIAGNOSIS — D508 Other iron deficiency anemias: Secondary | ICD-10-CM

## 2017-06-07 DIAGNOSIS — Z7982 Long term (current) use of aspirin: Secondary | ICD-10-CM | POA: Diagnosis not present

## 2017-06-07 DIAGNOSIS — Z794 Long term (current) use of insulin: Secondary | ICD-10-CM | POA: Diagnosis not present

## 2017-06-07 DIAGNOSIS — Z87442 Personal history of urinary calculi: Secondary | ICD-10-CM | POA: Diagnosis not present

## 2017-06-07 DIAGNOSIS — D500 Iron deficiency anemia secondary to blood loss (chronic): Secondary | ICD-10-CM | POA: Diagnosis not present

## 2017-06-07 MED ORDER — SODIUM CHLORIDE 0.9 % IV SOLN
510.0000 mg | Freq: Once | INTRAVENOUS | Status: AC
  Administered 2017-06-07: 510 mg via INTRAVENOUS
  Filled 2017-06-07: qty 17

## 2017-06-07 NOTE — Patient Instructions (Signed)

## 2017-06-08 DIAGNOSIS — N200 Calculus of kidney: Secondary | ICD-10-CM | POA: Diagnosis not present

## 2017-06-27 ENCOUNTER — Other Ambulatory Visit: Payer: Self-pay

## 2017-06-27 ENCOUNTER — Encounter: Payer: Self-pay | Admitting: Family

## 2017-06-27 ENCOUNTER — Inpatient Hospital Stay: Payer: Medicare Other | Attending: Hematology & Oncology | Admitting: Family

## 2017-06-27 ENCOUNTER — Inpatient Hospital Stay: Payer: Medicare Other

## 2017-06-27 VITALS — BP 99/35 | HR 79 | Temp 98.0°F | Wt 178.0 lb

## 2017-06-27 DIAGNOSIS — Z794 Long term (current) use of insulin: Secondary | ICD-10-CM | POA: Diagnosis not present

## 2017-06-27 DIAGNOSIS — D508 Other iron deficiency anemias: Secondary | ICD-10-CM

## 2017-06-27 DIAGNOSIS — N189 Chronic kidney disease, unspecified: Principal | ICD-10-CM

## 2017-06-27 DIAGNOSIS — Z79899 Other long term (current) drug therapy: Secondary | ICD-10-CM | POA: Diagnosis not present

## 2017-06-27 DIAGNOSIS — D500 Iron deficiency anemia secondary to blood loss (chronic): Secondary | ICD-10-CM

## 2017-06-27 DIAGNOSIS — D631 Anemia in chronic kidney disease: Secondary | ICD-10-CM

## 2017-06-27 LAB — CBC WITH DIFFERENTIAL (CANCER CENTER ONLY)
Basophils Absolute: 0 10*3/uL (ref 0.0–0.1)
Basophils Relative: 1 %
Eosinophils Absolute: 0.2 10*3/uL (ref 0.0–0.5)
Eosinophils Relative: 3 %
HEMATOCRIT: 36.2 % — AB (ref 38.7–49.9)
HEMOGLOBIN: 10.9 g/dL — AB (ref 13.0–17.1)
LYMPHS ABS: 0.9 10*3/uL (ref 0.9–3.3)
Lymphocytes Relative: 16 %
MCH: 25.6 pg — AB (ref 28.0–33.4)
MCHC: 30.1 g/dL — AB (ref 32.0–35.9)
MCV: 85.2 fL (ref 82.0–98.0)
MONO ABS: 0.4 10*3/uL (ref 0.1–0.9)
MONOS PCT: 7 %
NEUTROS PCT: 73 %
Neutro Abs: 4 10*3/uL (ref 1.5–6.5)
Platelet Count: 243 10*3/uL (ref 145–400)
RBC: 4.25 MIL/uL (ref 4.20–5.70)
RDW: 27.6 % — ABNORMAL HIGH (ref 11.1–15.7)
WBC Count: 5.5 10*3/uL (ref 4.0–10.0)

## 2017-06-27 LAB — IRON AND TIBC
Iron: 35 ug/dL — ABNORMAL LOW (ref 42–163)
Saturation Ratios: 10 % — ABNORMAL LOW (ref 42–163)
TIBC: 356 ug/dL (ref 202–409)
UIBC: 321 ug/dL

## 2017-06-27 LAB — RETICULOCYTES
RBC.: 4.29 MIL/uL (ref 4.20–5.82)
RETIC CT PCT: 2 % — AB (ref 0.8–1.8)
Retic Count, Absolute: 85.8 10*3/uL (ref 34.8–93.9)

## 2017-06-27 LAB — SAMPLE TO BLOOD BANK

## 2017-06-27 LAB — CMP (CANCER CENTER ONLY)
ALK PHOS: 99 U/L (ref 40–150)
ALT: 45 U/L (ref 0–55)
AST: 34 U/L (ref 5–34)
Albumin: 3.4 g/dL — ABNORMAL LOW (ref 3.5–5.0)
Anion gap: 8 (ref 3–11)
BUN: 17 mg/dL (ref 7–26)
CALCIUM: 9.1 mg/dL (ref 8.4–10.4)
CO2: 25 mmol/L (ref 22–29)
CREATININE: 0.92 mg/dL (ref 0.70–1.30)
Chloride: 103 mmol/L (ref 98–109)
GFR, Estimated: >60 mL/min (ref >60)
GLUCOSE: 237 mg/dL — AB (ref 70–140)
Potassium: 4.6 mmol/L (ref 3.5–5.1)
SODIUM: 136 mmol/L (ref 136–145)
TOTAL PROTEIN: 6.6 g/dL (ref 6.4–8.3)

## 2017-06-27 LAB — SAVE SMEAR

## 2017-06-27 LAB — FERRITIN: Ferritin: 43 ng/mL (ref 22–316)

## 2017-06-27 NOTE — Progress Notes (Signed)
Hematology and Oncology Follow Up Visit  [name] [id number] 04/14/1940 77 y.o. 06/27/2017   Principle Diagnosis:  Iron deficiency anemia secondary to intermittent GI bleed  Current Therapy:   IV iron as indicated Supportive transfusions as needed   Interim History:  [name] is here today with his daughter for follow-up. He received 2 doses of iron last month and his symptoms have resolved. Hgb is now 10.9 with an MCV of 85.  I repeated a manual BP and his BP was 118/62.  He has had no bleeding, no bruising or petechiae.  He passed a kidney stone and is feeling much better.  No fever, chills, n/v, cough, rash, dizziness, SOB, chest pain, palpitations, abdominal pain or changes in bowel or bladder habits.  No swelling, tenderness, numbness or tingling in his extremities. He uses his can e when ambulating as needed for support.  No lymphadenopathy found on exam.  He has a good appetite and is staying well hydrated. Her weight is stable.   ECOG Performance Status: 1 - Symptomatic but completely ambulatory  Medications:  Allergies as of 06/27/2017      Reactions   Cyclobenzaprine Other (See Comments)   Unsteady gait      Medication List        Accurate as of 06/27/17 10:04 AM. Always use your most recent med list.          aspirin EC 81 MG tablet Take 81 mg by mouth daily.   co-enzyme Q-10 30 MG capsule Take 30 mg by mouth daily.   diltiazem 120 MG 24 hr capsule Commonly known as:  CARDIZEM CD Take 1 capsule (120 mg total) by mouth daily.   flecainide 150 MG tablet Commonly known as:  TAMBOCOR Take 1.5 tablets (225 mg total) by mouth 2 (two) times daily.   HYDROcodone-acetaminophen 5-325 MG tablet Commonly known as:  NORCO/VICODIN Take 1 tablet by mouth every 8 (eight) hours as needed for severe pain.   hydrocortisone cream 1 % Apply 1 application topically 2 (two) times daily.   Insulin Glargine 100 UNIT/ML Solostar Pen Commonly known as:   LANTUS Inject 8 Units into the skin 2 (two) times daily.   loratadine 10 MG tablet Commonly known as:  CLARITIN Take 10 mg by mouth every evening.   metFORMIN 1000 MG tablet Commonly known as:  GLUCOPHAGE Take 1,000 mg by mouth 2 (two) times daily with a meal.   metoprolol succinate 25 MG 24 hr tablet Commonly known as:  TOPROL-XL TAKE 1/2 TABLET  (12.5 MG TOTAL) BY MOUTH DAILY.   multivitamin with minerals Tabs tablet Take 1 tablet by mouth daily.   nitroGLYCERIN 0.4 MG SL tablet Commonly known as:  NITROSTAT Place 1 tablet (0.4 mg total) under the tongue every 5 (five) minutes x 3 doses as needed for chest pain.   oxybutynin 5 MG 24 hr tablet Commonly known as:  DITROPAN-XL Take 5 mg by mouth daily.   pantoprazole 40 MG tablet Commonly known as:  PROTONIX Take 1 tablet (40 mg total) by mouth 2 (two) times daily before a meal.   polyethylene glycol packet Commonly known as:  MIRALAX / GLYCOLAX Take 17 g by mouth 2 (two) times daily.   pravastatin 40 MG tablet Commonly known as:  PRAVACHOL Take 1 tablet (40 mg total) by mouth every evening.   sulfamethoxazole-trimethoprim 800-160 MG tablet Commonly known as:  BACTRIM DS,SEPTRA DS Take 1 tablet by mouth 2 (two) times daily.   tamsulosin 0.4 MG  Caps capsule Commonly known as:  FLOMAX Take 0.4 mg by mouth daily.       Allergies:  Allergies  Allergen Reactions  . Cyclobenzaprine Other (See Comments)    Unsteady gait    Past Medical History, Surgical history, Social history, and Family History were reviewed and updated.  Review of Systems: All other 10 point review of systems is negative.   Physical Exam:  weight is 178 lb (80.7 kg). His oral temperature is 98 F (36.7 C). His blood pressure is 99/35 (abnormal) and his pulse is 79. His oxygen saturation is 98%.   Wt Readings from Last 3 Encounters:  06/27/17 178 lb (80.7 kg)  05/30/17 176 lb 8 oz (80.1 kg)  05/22/17 170 lb 6.7 oz (77.3 kg)    Ocular:  Sclerae unicteric, pupils equal, round and reactive to light Ear-nose-throat: Oropharynx clear, dentition fair Lymphatic: No cervical, supraclavicular or axillary adenopathy Lungs no rales or rhonchi, good excursion bilaterally Heart regular rate and rhythm, no murmur appreciated Abd soft, nontender, positive bowel sounds, no liver or spleen tip palpated on exam, no fluid wave  MSK no focal spinal tenderness, no joint edema Neuro: non-focal, well-oriented, appropriate affect Breasts: Deferred   Lab Results  Component Value Date   WBC 5.5 06/27/2017   HGB 8.0 (L) 05/22/2017   HCT 36.2 (L) 06/27/2017   MCV 85.2 06/27/2017   PLT 243 06/27/2017   Lab Results  Component Value Date   FERRITIN 7 (L) 05/30/2017   IRON 14 (L) 05/30/2017   TIBC 396 05/30/2017   UIBC 382 05/30/2017   IRONPCTSAT 4 (L) 05/30/2017   Lab Results  Component Value Date   RETICCTPCT 1.5 05/30/2017   RBC 4.25 06/27/2017   No results found for: KPAFRELGTCHN, LAMBDASER, KAPLAMBRATIO No results found for: IGGSERUM, IGA, IGMSERUM No results found for: Odetta Pink, SPEI   Chemistry      Component Value Date/Time   NA 139 05/22/2017 0849   NA 139 10/20/2016 1110   K 3.8 05/22/2017 0849   K 4.0 10/20/2016 1110   CL 104 05/22/2017 0849   CO2 24 05/22/2017 0849   CO2 26 10/20/2016 1110   BUN 18 05/22/2017 0849   BUN 15.2 10/20/2016 1110   CREATININE 0.83 05/22/2017 0849   CREATININE 0.8 10/20/2016 1110      Component Value Date/Time   CALCIUM 8.8 (L) 05/22/2017 0849   CALCIUM 9.6 10/20/2016 1110   ALKPHOS 73 05/21/2017 1559   ALKPHOS 85 10/20/2016 1110   AST 31 05/21/2017 1559   AST 33 10/20/2016 1110   ALT 16 (L) 05/21/2017 1559   ALT 42 10/20/2016 1110   BILITOT 0.4 05/21/2017 1559   BILITOT <0.22 10/20/2016 1110      Impression and Plan: [name] is a very pleasant 77 yo caucasian gentleman with anemia secondary to intermittent GI blood  loss and malabsorption on Protonix.  He has responded nicely to the IV iron and Hgb is now 10.9. His symptoms have resolved.  We will see what his iron studies show and bring him back in for infusion if needed.  We will see him back in another 6 weeks for follow-up.  He will contact our office with any questions or concerns. We can certainly see him sooner if need be.    Laverna Peace, NP 4/3/201910:04 AM

## 2017-07-05 ENCOUNTER — Inpatient Hospital Stay: Payer: Medicare Other

## 2017-07-05 VITALS — BP 128/64 | HR 76 | Temp 98.1°F | Resp 18

## 2017-07-05 DIAGNOSIS — Z79899 Other long term (current) drug therapy: Secondary | ICD-10-CM | POA: Diagnosis not present

## 2017-07-05 DIAGNOSIS — D508 Other iron deficiency anemias: Secondary | ICD-10-CM

## 2017-07-05 DIAGNOSIS — Z794 Long term (current) use of insulin: Secondary | ICD-10-CM | POA: Diagnosis not present

## 2017-07-05 DIAGNOSIS — D500 Iron deficiency anemia secondary to blood loss (chronic): Secondary | ICD-10-CM | POA: Diagnosis not present

## 2017-07-05 MED ORDER — SODIUM CHLORIDE 0.9 % IV SOLN
510.0000 mg | Freq: Once | INTRAVENOUS | Status: AC
  Administered 2017-07-05: 510 mg via INTRAVENOUS
  Filled 2017-07-05: qty 17

## 2017-07-12 ENCOUNTER — Inpatient Hospital Stay: Payer: Medicare Other

## 2017-07-12 ENCOUNTER — Other Ambulatory Visit: Payer: Self-pay

## 2017-07-12 VITALS — BP 100/52 | HR 69 | Temp 97.9°F | Resp 20

## 2017-07-12 DIAGNOSIS — D508 Other iron deficiency anemias: Secondary | ICD-10-CM

## 2017-07-12 DIAGNOSIS — Z79899 Other long term (current) drug therapy: Secondary | ICD-10-CM | POA: Diagnosis not present

## 2017-07-12 DIAGNOSIS — D500 Iron deficiency anemia secondary to blood loss (chronic): Secondary | ICD-10-CM | POA: Diagnosis not present

## 2017-07-12 DIAGNOSIS — Z794 Long term (current) use of insulin: Secondary | ICD-10-CM | POA: Diagnosis not present

## 2017-07-12 MED ORDER — SODIUM CHLORIDE 0.9 % IV SOLN
510.0000 mg | Freq: Once | INTRAVENOUS | Status: AC
  Administered 2017-07-12: 510 mg via INTRAVENOUS
  Filled 2017-07-12: qty 17

## 2017-07-12 MED ORDER — SODIUM CHLORIDE 0.9 % IV SOLN
INTRAVENOUS | Status: DC
  Administered 2017-07-12: 08:00:00 via INTRAVENOUS

## 2017-07-12 NOTE — Patient Instructions (Signed)

## 2017-07-12 NOTE — Progress Notes (Signed)
Pt refused to stay for full 30 minutes after feraheme.  Patient without complaints at time of discharge.  Pt.'s daughter with patient at time of discharge.

## 2017-07-26 ENCOUNTER — Encounter (HOSPITAL_COMMUNITY): Payer: Self-pay | Admitting: Emergency Medicine

## 2017-07-26 ENCOUNTER — Other Ambulatory Visit: Payer: Self-pay

## 2017-07-26 ENCOUNTER — Emergency Department (HOSPITAL_COMMUNITY)
Admission: EM | Admit: 2017-07-26 | Discharge: 2017-07-27 | Disposition: A | Discharge status: Home / Self Care | Payer: Medicare Other | Attending: Emergency Medicine | Admitting: Emergency Medicine

## 2017-07-26 DIAGNOSIS — I110 Hypertensive heart disease with heart failure: Secondary | ICD-10-CM | POA: Insufficient documentation

## 2017-07-26 DIAGNOSIS — R079 Chest pain, unspecified: Secondary | ICD-10-CM | POA: Diagnosis not present

## 2017-07-26 DIAGNOSIS — I5032 Chronic diastolic (congestive) heart failure: Secondary | ICD-10-CM | POA: Insufficient documentation

## 2017-07-26 DIAGNOSIS — F17210 Nicotine dependence, cigarettes, uncomplicated: Secondary | ICD-10-CM | POA: Insufficient documentation

## 2017-07-26 DIAGNOSIS — Z794 Long term (current) use of insulin: Secondary | ICD-10-CM | POA: Insufficient documentation

## 2017-07-26 DIAGNOSIS — Z79899 Other long term (current) drug therapy: Secondary | ICD-10-CM | POA: Insufficient documentation

## 2017-07-26 DIAGNOSIS — J449 Chronic obstructive pulmonary disease, unspecified: Secondary | ICD-10-CM | POA: Insufficient documentation

## 2017-07-26 DIAGNOSIS — E119 Type 2 diabetes mellitus without complications: Secondary | ICD-10-CM | POA: Diagnosis not present

## 2017-07-26 DIAGNOSIS — R1012 Left upper quadrant pain: Secondary | ICD-10-CM

## 2017-07-26 DIAGNOSIS — Z7982 Long term (current) use of aspirin: Secondary | ICD-10-CM | POA: Diagnosis not present

## 2017-07-26 DIAGNOSIS — N200 Calculus of kidney: Secondary | ICD-10-CM | POA: Diagnosis not present

## 2017-07-26 DIAGNOSIS — I2510 Atherosclerotic heart disease of native coronary artery without angina pectoris: Secondary | ICD-10-CM | POA: Diagnosis not present

## 2017-07-26 DIAGNOSIS — R0602 Shortness of breath: Secondary | ICD-10-CM | POA: Diagnosis not present

## 2017-07-26 DIAGNOSIS — R072 Precordial pain: Secondary | ICD-10-CM | POA: Diagnosis not present

## 2017-07-26 DIAGNOSIS — R61 Generalized hyperhidrosis: Secondary | ICD-10-CM | POA: Insufficient documentation

## 2017-07-26 NOTE — ED Triage Notes (Signed)
Patient arrived with EMS from home reports intermittent left chest pain onset this afternoon with mild SOB , no nausea or diaphoresis , he received ASA 324 mg by EMS , denies pain at arrival .

## 2017-07-27 ENCOUNTER — Emergency Department (HOSPITAL_COMMUNITY): Payer: Medicare Other

## 2017-07-27 DIAGNOSIS — N200 Calculus of kidney: Secondary | ICD-10-CM | POA: Diagnosis not present

## 2017-07-27 DIAGNOSIS — R079 Chest pain, unspecified: Secondary | ICD-10-CM | POA: Diagnosis not present

## 2017-07-27 LAB — CBC
HCT: 37.8 % — ABNORMAL LOW (ref 39.0–52.0)
HEMOGLOBIN: 12.3 g/dL — AB (ref 13.0–17.0)
MCH: 29 pg (ref 26.0–34.0)
MCHC: 32.5 g/dL (ref 30.0–36.0)
MCV: 89.2 fL (ref 78.0–100.0)
Platelets: 190 10*3/uL (ref 150–400)
RBC: 4.24 MIL/uL (ref 4.22–5.81)
RDW: 21.9 % — ABNORMAL HIGH (ref 11.5–15.5)
WBC: 6.4 10*3/uL (ref 4.0–10.5)

## 2017-07-27 LAB — HEPATIC FUNCTION PANEL
ALBUMIN: 3.4 g/dL — AB (ref 3.5–5.0)
ALT: 28 U/L (ref 17–63)
AST: 23 U/L (ref 15–41)
Alkaline Phosphatase: 74 U/L (ref 38–126)
Bilirubin, Direct: <0.1 mg/dL — ABNORMAL LOW (ref 0.1–0.5)
TOTAL PROTEIN: 6.2 g/dL — AB (ref 6.5–8.1)
Total Bilirubin: 0.3 mg/dL (ref 0.3–1.2)

## 2017-07-27 LAB — LIPASE, BLOOD: LIPASE: 44 U/L (ref 11–51)

## 2017-07-27 LAB — BASIC METABOLIC PANEL
ANION GAP: 12 (ref 5–15)
BUN: 17 mg/dL (ref 6–20)
CHLORIDE: 103 mmol/L (ref 101–111)
CO2: 23 mmol/L (ref 22–32)
Calcium: 9 mg/dL (ref 8.9–10.3)
Creatinine, Ser: 0.9 mg/dL (ref 0.61–1.24)
GFR calc non Af Amer: >60 mL/min (ref >60)
GLUCOSE: 130 mg/dL — AB (ref 65–99)
Potassium: 4.1 mmol/L (ref 3.5–5.1)
Sodium: 138 mmol/L (ref 135–145)

## 2017-07-27 LAB — I-STAT TROPONIN, ED
TROPONIN I, POC: 0 ng/mL (ref 0.00–0.08)
Troponin i, poc: 0.01 ng/mL (ref 0.00–0.08)

## 2017-07-27 LAB — URINALYSIS, ROUTINE W REFLEX MICROSCOPIC
BILIRUBIN URINE: NEGATIVE
Glucose, UA: NEGATIVE mg/dL
Hgb urine dipstick: NEGATIVE
KETONES UR: NEGATIVE mg/dL
NITRITE: NEGATIVE
PH: 5 (ref 5.0–8.0)
PROTEIN: NEGATIVE mg/dL
Specific Gravity, Urine: 1.017 (ref 1.005–1.030)

## 2017-07-27 LAB — PROTIME-INR
INR: 0.98
Prothrombin Time: 12.9 seconds (ref 11.4–15.2)

## 2017-07-27 MED ORDER — CEPHALEXIN 500 MG PO CAPS: 500.0000 mg | ORAL_CAPSULE | Freq: Two times a day (BID) | ORAL | 0 refills | Status: DC

## 2017-07-27 MED ORDER — IOHEXOL 300 MG/ML  SOLN
100.0000 mL | Freq: Once | INTRAMUSCULAR | Status: AC | PRN
  Administered 2017-07-27: 100 mL via INTRAVENOUS

## 2017-07-27 MED ORDER — FENTANYL CITRATE (PF) 100 MCG/2ML IJ SOLN
50.0000 ug | Freq: Once | INTRAMUSCULAR | Status: AC
  Administered 2017-07-27: 50 ug via INTRAVENOUS
  Filled 2017-07-27: qty 2

## 2017-07-27 NOTE — ED Provider Notes (Signed)
[facility] EMERGENCY DEPARTMENT Provider Note   CSN: [id number] Arrival date & time: 07/26/17  2340     History   Chief Complaint Chief Complaint  Patient presents with  . Chest Pain    HPI [name] is a 77 y.o. male.  The history is provided by the patient.  Chest Pain   This is a new problem. The current episode started 12 to 24 hours ago. The problem occurs constantly. The problem has been gradually worsening. The pain is moderate. The pain does not radiate. The symptoms are aggravated by certain positions. Associated symptoms include abdominal pain, diaphoresis and shortness of breath. Pertinent negatives include no vomiting. He has tried nothing for the symptoms. Risk factors include being elderly.  pt  reports onset of left sided chest pain over several hours ago.  Reports mild shortness of breath.  He reports he does feel clammy at times.  He also reports abdominal pain.  Past Medical History:  Diagnosis Date  . Anemia    takes Ferrous Sulfate daily  . Arthritis    "all over"  . Balance problem 01/2014  . CAP (community acquired pneumonia) 09/18/2014  . Cervical radiculopathy due to degenerative joint disease of spine   . COPD (chronic obstructive pulmonary disease) ([place])   . Coronary artery disease, non-occlusive    a. 03/2010 Nonocclusive disease by cath, performed for ST elevations on ECG;  b. 06/2013 Lexi MV: EF 60%, no ischemia.  . Diabetes mellitus type II    takes Metformin and Lantus daily  . Diastolic CHF, chronic ([place])    a. 12/2012 EF 55-60%, diast dysfxn, triv MR, mildly dil LA/RA.  [name] History of blood transfusion 1982   "when I had stomach OR"  . History of bronchitis    1998  . History of gastric ulcer   . HTN (hypertension)    takes Diltiazem daily  . Hyperlipidemia    takes Pravastatin daily  . Joint pain   . PAF (paroxysmal atrial fibrillation) (H[facility])    Recurrent after atrial flutter (a. 07/2010 Status post caval tricuspid  isthmus ablation by Dr. Midge Aver Metoprolol daily), currently controlled on flecainide plus diltiazem and Coumadin  . Weakness    numbness and tingling both hands    Patient Active Problem List   Diagnosis Date Noted  . HTN (hypertension) 05/21/2017  . Diabetes mellitus type II 05/21/2017  . Diastolic CHF, chronic ([place]) 05/21/2017  . COPD (chronic obstructive pulmonary disease) ([place]) 05/21/2017  . H/O atrial flutter 05/21/2017  . Coronary disease/nonobstructive 05/21/2017  . Acute blood loss anemia 10/02/2016  . Abdominal pain 10/02/2016  . Constipation 10/02/2016  . Left lower quadrant pain   . Coagulopathy ([place])   . Heme positive stool   . Symptomatic anemia 07/10/2016  . Syncope 05/15/2015  . Cervical disc disease 05/15/2015  . Abnormal urinalysis 05/15/2015  . Anemia 05/15/2015  . History of CVA (cerebrovascular accident) 04/03/2014  . Near syncope 02/15/2014  . Pre-syncope 02/15/2014  . Benign neoplasm of rectum and anal canal 12/02/2013  . Upper GI bleeding 11/30/2013  . Tobacco use disorder 11/30/2013  . PAF (paroxysmal atrial fibrillation), maintaining SR; CHA2DS2Vasc = 7 08/15/2013  . Anticoagulation goal of INR 2 to 3, for PAF - CHA2DS2Vasc = 7 08/15/2013  . Type 2 diabetes mellitus ([place]) 01/22/2013  . Tobacco abuse counseling 12/15/2012  . Hyperlipidemia   . Obesity (BMI 30-39.9) 12/14/2012  . Atrial flutter - status post CTI ablation 07/29/2010  . Essential  hypertension 07/29/2010  . Chronic diastolic congestive heart failure ([place]) 07/29/2010  . Coronary artery disease, non-occlusive 07/29/2010    Past Surgical History:  Procedure Laterality Date  . ATRIAL ABLATION SURGERY  08/05/10   CTI ablation for atrial flutter by JA  . CARDIAC CATHETERIZATION  2012   nl LV function, no occlusive CAD, PAF  . CARDIOVERSION  12/07/2010    Successful direct current cardioversion with atrial fibrillation to normal sinus rhythm  . CARPAL TUNNEL RELEASE Bilateral  01/30/2014   Procedure: BILATERAL CARPAL TUNNEL RELEASE;  Surgeon: Marianna Payment, MD;  Location: [facility];  Service: Orthopedics;  Laterality: Bilateral;  . CATARACT EXTRACTION W/ INTRAOCULAR LENS  IMPLANT, BILATERAL Bilateral   . COLONOSCOPY [name] 12/02/2013   Procedure: COLONOSCOPY;  Surgeon: Irene Shipper, MD;  Location: [facility];  Service: Endoscopy;  Laterality: [name];  . ESOPHAGOGASTRODUODENOSCOPY [name] 09/22/2014   Procedure: ESOPHAGOGASTRODUODENOSCOPY (EGD);  Surgeon: Ronald Lobo, MD;  Location: [facility] ENDOSCOPY;  Service: Endoscopy;  Laterality: [name];  . ESOPHAGOGASTRODUODENOSCOPY [name] 07/11/2016   Procedure: ESOPHAGOGASTRODUODENOSCOPY (EGD);  Surgeon: Doran Stabler, MD;  Location[facility]COPY;  Service: Endoscopy;  Laterality: [name];  . INCISION AND DRAINAGE ABSCESS / HEMATOMA OF BURSA / KNEE / THIGH Left 1998   knee  . KNEE BURSECTOMY Left 1998  . LAPAROSCOPIC CHOLECYSTECTOMY  03/2010  . NM MYOVIEW LTD  07/22/2013   Normal EF ~60%, no ischemia or infarction.  [name] PARTIAL GASTRECTOMY  1982   subtotal; "took out 30% for ulcers"  . TRANSTHORACIC ECHOCARDIOGRAM  02/16/2014   EF 60%, no RWMA. - otherwise normal  . YAG LASER APPLICATION Bilateral         Home Medications    Prior to Admission medications   Medication Sig Start Date End Date Taking? Authorizing Provider  aspirin EC 81 MG tablet Take 81 mg by mouth daily.   Yes [provider]  co-enzyme Q-10 30 MG capsule Take 30 mg by mouth daily.    Yes [provider]  diltiazem (CARDIZEM CD) 120 MG 24 hr capsule Take 1 capsule (120 mg total) by mouth daily. 05/04/17  Yes Leonie Man, MD  flecainide (TAMBOCOR) 150 MG tablet Take 1.5 tablets (225 mg total) by mouth 2 (two) times daily. 05/04/17  Yes Leonie Man, MD  hydrocortisone cream 1 % Apply 1 application topically 2 (two) times daily.  01/02/17  Yes [provider]  Insulin Glargine (LANTUS) 100 UNIT/ML Solostar Pen Inject 8 Units into the skin 2  (two) times daily. 11/30/16  Yes Domenic Polite, MD  loratadine (CLARITIN) 10 MG tablet Take 10 mg by mouth every evening.    Yes [provider]  metFORMIN (GLUCOPHAGE) 1000 MG tablet Take 1,000 mg by mouth 2 (two) times daily with a meal.    Yes [provider]  metoprolol succinate (TOPROL-XL) 25 MG 24 hr tablet TAKE 1/2 TABLET  (12.5 MG TOTAL) BY MOUTH DAILY. 05/04/17  Yes Leonie Man, MD  Multiple Vitamin (MULTIVITAMIN WITH MINERALS) TABS tablet Take 1 tablet by mouth daily.    Yes [provider]  nitroGLYCERIN (NITROSTAT) 0.4 MG SL tablet Place 1 tablet (0.4 mg total) under the tongue every 5 (five) minutes x 3 doses as needed for chest pain. 11/19/15  Yes Kilroy, Luke K, PA-C  oxybutynin (DITROPAN-XL) 5 MG 24 hr tablet Take 5 mg by mouth daily. 11/02/16 11/02/17 Yes [provider]  pantoprazole (PROTONIX) 40 MG tablet Take 1 tablet (40 mg total) by  mouth 2 (two) times daily before a meal. 11/30/16  Yes Domenic Polite, MD  polyethylene glycol [facility] / GLYCOLAX) packet Take 17 g by mouth 2 (two) times daily. Patient taking differently: Take 17 g by mouth daily as needed for moderate constipation.  10/03/16  Yes Sheikh, Omair Latif, DO  pravastatin (PRAVACHOL) 40 MG tablet Take 1 tablet (40 mg total) by mouth every evening. 05/04/17  Yes Leonie Man, MD  tamsulosin (FLOMAX) 0.4 MG CAPS capsule Take 0.4 mg by mouth daily. 02/20/17  Yes [provider]  sulfamethoxazole-trimethoprim (BACTRIM DS,SEPTRA DS) 800-160 MG tablet Take 1 tablet by mouth 2 (two) times daily. Patient not taking: Reported on 07/27/2017 06/04/17   [place], Holli Humbles, NP    Family History Family History  Problem Relation Age of Onset  . Cancer Mother   . Heart attack Father   . Heart attack Brother     Social History Social History   Tobacco Use  . Smoking status: Current Every Day Smoker    Packs/day: 0.50    Years: 55.00    Pack years: 27.50    Types: Cigarettes  .  Smokeless tobacco: Never Used  . Tobacco comment: He's been smoking between 0.5 and 2 ppd since age 52.  Substance Use Topics  . Alcohol use: No    Alcohol/week: 0.0 oz    Comment: "quit drinking in 1986"  . Drug use: No     Allergies   Cyclobenzaprine   Review of Systems Review of Systems  Constitutional: Positive for diaphoresis.  Respiratory: Positive for shortness of breath.   Cardiovascular: Positive for chest pain.  Gastrointestinal: Positive for abdominal pain. Negative for blood in stool, diarrhea and vomiting.  All other systems reviewed and are negative.    Physical Exam Updated Vital Signs BP 106/65 (BP Location: Left Arm)   Pulse 69   Temp 98.1 F (36.7 C) (Oral)   Resp 16   Ht 1.676 m (5\' 6" )   Wt 78.5 kg (173 lb)   SpO2 99%   BMI 27.92 kg/m   Physical Exam  CONSTITUTIONAL: elderly, no acute distress HEAD: Normocephalic/atraumatic EYES: EOMI/PERRL ENMT: Mucous membranes moist NECK: supple no meningeal signs SPINE/BACK:entire spine nontender CV: S1/S2 noted, no murmurs/rubs/gallops noted LUNGS: wheezing bilaterally, no distress Chest - mild tenderness to left chest, no bruising or crepitus or rash ABDOMEN: soft, moderate left upper quadrant tenderness, no rebound or guarding, bowel sounds noted throughout abdomen GU:no cva tenderness NEURO: Pt is awake/alert/appropriate, moves all extremitiesx4.   EXTREMITIES: pulses normal/equal, full ROM SKIN: warm, color normal PSYCH: no abnormalities of mood noted, alert and oriented to situation  ED Treatments / Results  Labs (all labs ordered are listed, but only abnormal results are displayed) Labs Reviewed  BASIC METABOLIC PANEL - Abnormal; Notable for the following components:      Result Value   Glucose, Bld 130 (*)    All other components within normal limits  CBC - Abnormal; Notable for the following components:   Hemoglobin 12.3 (*)    HCT 37.8 (*)    RDW 21.9 (*)    All other components  within normal limits  HEPATIC FUNCTION PANEL - Abnormal; Notable for the following components:   Total Protein 6.2 (*)    Albumin 3.4 (*)    Bilirubin, Direct <0.1 (*)    All other components within normal limits  URINALYSIS, ROUTINE W REFLEX MICROSCOPIC - Abnormal; Notable for the following components:   Leukocytes, UA LARGE (*)  Bacteria, UA RARE (*)    All other components within normal limits  PROTIME-INR  LIPASE, BLOOD  I-STAT TROPONIN, ED  I-STAT TROPONIN, ED    EKG EKG Interpretation  Date/Time:  Thursday Jul 26 2017 23:47:06 EDT Ventricular Rate:  72 PR Interval:    QRS Duration: 102 QT Interval:  422 QTC Calculation: 462 R Axis:   -37 Text Interpretation:  Sinus rhythm Prolonged PR interval Left axis deviation No significant change since last tracing Confirmed by Ripley Fraise [phone]) on 07/27/2017 12:24:30 AM   Radiology Dg Chest 2 View  Result Date: 07/27/2017 CLINICAL DATA:  Chest pain EXAM: CHEST - 2 VIEW COMPARISON:  05/21/2017, 03/09/2017 FINDINGS: No acute pulmonary infiltrate or effusion. Stable cardiomediastinal silhouette. No pneumothorax. IMPRESSION: No active cardiopulmonary disease. Electronically Signed   By: Donavan Foil M.D.   On: 07/27/2017 00:40   Ct Abdomen Pelvis W Contrast  Result Date: 07/27/2017 CLINICAL DATA:  Intermittent chest pain EXAM: CT ABDOMEN AND PELVIS WITH CONTRAST TECHNIQUE: Multidetector CT imaging of the abdomen and pelvis was performed using the standard protocol following bolus administration of intravenous contrast. CONTRAST:  113mL OMNIPAQUE IOHEXOL 300 MG/ML  SOLN COMPARISON:  03/09/2017 FINDINGS: Lower chest: Lung bases demonstrate calcified granuloma in the right middle lobe. No acute consolidation or pleural effusion. Heart size within normal limits. Coronary vascular calcification. Clips near the GE junction. Hepatobiliary: No focal liver abnormality is seen. Status post cholecystectomy. No biliary dilatation. Pancreas:  Unremarkable. No pancreatic ductal dilatation or surrounding inflammatory changes. Spleen: Normal in size without focal abnormality. Adrenals/Urinary Tract: Adrenal glands are within normal limits. No hydronephrosis. 7 mm stone lower pole right kidney. Bladder within normal limits. Stomach/Bowel: Status post partial gastrectomy. Similar appearance moderate dilatation of the stomach filled with food and debris. Mild thickening at the gastric small bowel anastomosis anteriorly, similar compared to prior. No evidence for small bowel obstruction. No colon wall thickening. Negative appendix. Vascular/Lymphatic: Marked aortic atherosclerosis. No significant adenopathy Reproductive: Multiple prostate calcifications. Other: Negative for free air or free fluid. Musculoskeletal: Degenerative changes. No acute or suspicious abnormality. IMPRESSION: 1. No definite CT evidence for acute intra-abdominal or pelvic abnormality. 2. Status post partial gastrectomy and gastrojejunostomy with similar appearance of moderately enlarged stomach containing food and debris. 3. Nonobstructing stone in the lower pole of the right kidney. Electronically Signed   By: Donavan Foil M.D.   On: 07/27/2017 02:33    Procedures Procedures   Medications Ordered in ED Medications  fentaNYL (SUBLIMAZE) injection 50 mcg (50 mcg Intravenous Given 07/27/17 0219)  iohexol (OMNIPAQUE) 300 MG/ML solution 100 mL (100 mLs Intravenous Contrast Given 07/27/17 0201)     Initial Impression / Assessment and Plan / ED Course  I have reviewed the triage vital signs and the nursing notes.  Pertinent labs & imaging results that were available during my care of the patient were reviewed by me and considered in my medical decision making (see chart for details).     2:10 AM Presents for chest pain, but he actually has significant left upper quadrant abdominal tenderness.  We will proceed with CT imaging. 4:46 AM Patient improved.  We discussed CT  findings that are negative.  Repeat troponin negative.  He denies any pain at this time.  No focal abdominal tenderness.  He is now drinking Sprite without difficulty. He is appropriate for discharge home.  I have low suspicion for ACS at this time.  No other signs of acute cardiopulmonary emergency.  Final Clinical Impressions(s) /  ED Diagnoses   Final diagnoses:  Left upper quadrant pain  Precordial pain    ED Discharge Orders    None       Ripley Fraise, MD 07/27/17 [phone]

## 2017-07-27 NOTE — Discharge Instructions (Addendum)

## 2017-07-27 NOTE — ED Notes (Signed)
Patient currently at CT scan .  

## 2017-07-27 NOTE — ED Provider Notes (Signed)
Brief addendum, I noted he has UTI, will add on Keflex for 1 week.  Patient agreeable   Ripley Fraise, MD 07/27/17 [phone]

## 2017-07-29 ENCOUNTER — Emergency Department (HOSPITAL_COMMUNITY): Payer: Medicare Other

## 2017-07-29 ENCOUNTER — Emergency Department (HOSPITAL_COMMUNITY)
Admission: EM | Admit: 2017-07-29 | Discharge: 2017-07-30 | Disposition: A | Discharge status: Home / Self Care | Payer: Medicare Other | Attending: Emergency Medicine | Admitting: Emergency Medicine

## 2017-07-29 ENCOUNTER — Other Ambulatory Visit: Payer: Self-pay

## 2017-07-29 DIAGNOSIS — Z9049 Acquired absence of other specified parts of digestive tract: Secondary | ICD-10-CM | POA: Insufficient documentation

## 2017-07-29 DIAGNOSIS — R5383 Other fatigue: Secondary | ICD-10-CM | POA: Diagnosis not present

## 2017-07-29 DIAGNOSIS — E119 Type 2 diabetes mellitus without complications: Secondary | ICD-10-CM | POA: Diagnosis not present

## 2017-07-29 DIAGNOSIS — F17210 Nicotine dependence, cigarettes, uncomplicated: Secondary | ICD-10-CM | POA: Insufficient documentation

## 2017-07-29 DIAGNOSIS — R531 Weakness: Secondary | ICD-10-CM | POA: Diagnosis not present

## 2017-07-29 DIAGNOSIS — J449 Chronic obstructive pulmonary disease, unspecified: Secondary | ICD-10-CM | POA: Diagnosis not present

## 2017-07-29 DIAGNOSIS — Z79899 Other long term (current) drug therapy: Secondary | ICD-10-CM | POA: Insufficient documentation

## 2017-07-29 DIAGNOSIS — N3000 Acute cystitis without hematuria: Secondary | ICD-10-CM

## 2017-07-29 DIAGNOSIS — N390 Urinary tract infection, site not specified: Secondary | ICD-10-CM | POA: Insufficient documentation

## 2017-07-29 DIAGNOSIS — I110 Hypertensive heart disease with heart failure: Secondary | ICD-10-CM | POA: Diagnosis not present

## 2017-07-29 DIAGNOSIS — R05 Cough: Secondary | ICD-10-CM | POA: Diagnosis not present

## 2017-07-29 DIAGNOSIS — Z7982 Long term (current) use of aspirin: Secondary | ICD-10-CM | POA: Insufficient documentation

## 2017-07-29 DIAGNOSIS — Z794 Long term (current) use of insulin: Secondary | ICD-10-CM | POA: Diagnosis not present

## 2017-07-29 DIAGNOSIS — I2510 Atherosclerotic heart disease of native coronary artery without angina pectoris: Secondary | ICD-10-CM | POA: Insufficient documentation

## 2017-07-29 DIAGNOSIS — Z8673 Personal history of transient ischemic attack (TIA), and cerebral infarction without residual deficits: Secondary | ICD-10-CM | POA: Diagnosis not present

## 2017-07-29 DIAGNOSIS — I5032 Chronic diastolic (congestive) heart failure: Secondary | ICD-10-CM | POA: Diagnosis not present

## 2017-07-29 DIAGNOSIS — R300 Dysuria: Secondary | ICD-10-CM | POA: Diagnosis present

## 2017-07-29 LAB — URINALYSIS, ROUTINE W REFLEX MICROSCOPIC
Bilirubin Urine: NEGATIVE
Glucose, UA: NEGATIVE mg/dL
HGB URINE DIPSTICK: NEGATIVE
Ketones, ur: 5 mg/dL — AB
Nitrite: NEGATIVE
PROTEIN: NEGATIVE mg/dL
SPECIFIC GRAVITY, URINE: 1.019 (ref 1.005–1.030)
pH: 6 (ref 5.0–8.0)

## 2017-07-29 LAB — COMPREHENSIVE METABOLIC PANEL
ALBUMIN: 3.5 g/dL (ref 3.5–5.0)
ALK PHOS: 75 U/L (ref 38–126)
ALT: 27 U/L (ref 17–63)
AST: 24 U/L (ref 15–41)
Anion gap: 11 (ref 5–15)
BILIRUBIN TOTAL: 0.3 mg/dL (ref 0.3–1.2)
BUN: 17 mg/dL (ref 6–20)
CALCIUM: 8.9 mg/dL (ref 8.9–10.3)
CO2: 21 mmol/L — ABNORMAL LOW (ref 22–32)
Chloride: 105 mmol/L (ref 101–111)
Creatinine, Ser: 0.93 mg/dL (ref 0.61–1.24)
GFR calc Af Amer: >60 mL/min (ref >60)
GFR calc non Af Amer: >60 mL/min (ref >60)
GLUCOSE: 130 mg/dL — AB (ref 65–99)
Potassium: 3.9 mmol/L (ref 3.5–5.1)
Sodium: 137 mmol/L (ref 135–145)
TOTAL PROTEIN: 6.7 g/dL (ref 6.5–8.1)

## 2017-07-29 LAB — CBC WITH DIFFERENTIAL/PLATELET
BASOS ABS: 0 10*3/uL (ref 0.0–0.1)
BASOS PCT: 0 %
Eosinophils Absolute: 0.2 10*3/uL (ref 0.0–0.7)
Eosinophils Relative: 2 %
HEMATOCRIT: 38.2 % — AB (ref 39.0–52.0)
Hemoglobin: 12.5 g/dL — ABNORMAL LOW (ref 13.0–17.0)
Lymphocytes Relative: 4 %
Lymphs Abs: 0.3 10*3/uL — ABNORMAL LOW (ref 0.7–4.0)
MCH: 29.3 pg (ref 26.0–34.0)
MCHC: 32.7 g/dL (ref 30.0–36.0)
MCV: 89.7 fL (ref 78.0–100.0)
MONO ABS: 0.2 10*3/uL (ref 0.1–1.0)
MONOS PCT: 4 %
NEUTROS ABS: 5.6 10*3/uL (ref 1.7–7.7)
Neutrophils Relative %: 90 %
Platelets: 202 10*3/uL (ref 150–400)
RBC: 4.26 MIL/uL (ref 4.22–5.81)
RDW: 20.6 % — AB (ref 11.5–15.5)
WBC: 6.2 10*3/uL (ref 4.0–10.5)

## 2017-07-29 LAB — LIPASE, BLOOD: Lipase: 40 U/L (ref 11–51)

## 2017-07-29 LAB — I-STAT CG4 LACTIC ACID, ED: Lactic Acid, Venous: 1 mmol/L (ref 0.5–1.9)

## 2017-07-29 MED ORDER — SODIUM CHLORIDE 0.9 % IV BOLUS
1000.0000 mL | Freq: Once | INTRAVENOUS | Status: AC
  Administered 2017-07-29: 1000 mL via INTRAVENOUS

## 2017-07-29 MED ORDER — SODIUM CHLORIDE 0.9 % IV SOLN
1.0000 g | Freq: Once | INTRAVENOUS | Status: AC
  Administered 2017-07-29: 1 g via INTRAVENOUS
  Filled 2017-07-29: qty 10

## 2017-07-29 NOTE — ED Notes (Signed)
Bed: WA08 Expected date: 07/29/17 Expected time: 9:39 PM Means of arrival: Ambulance Comments: Weakness

## 2017-07-29 NOTE — ED Triage Notes (Signed)
Pt bib PTAR and coming from home.  Pt reports generalized weakness that started yesterday.  Pt also reports bilateral pain.  Enroute pt began to have groin pain and leg pain.  Pt was seen for similar symptoms a few days.  Pt has DM and had a CBG of 127 for PTAR. Pt a/o x 4 and ambulated to the ambulance truck.

## 2017-07-29 NOTE — ED Provider Notes (Addendum)
[facility] DEPT Provider Note   CSN: [id number] Arrival date & time: 07/29/17  2142     History   Chief Complaint No chief complaint on file.   HPI [name] is a 77 y.o. male.  The history is provided by the patient and medical records. No language interpreter was used.  Dysuria   This is a new problem. The current episode started more than 2 days ago. The problem occurs every urination. The problem has not changed since onset.The quality of the pain is described as burning and aching. The pain is moderate. Maximum temperature: chills. Associated symptoms include chills, frequency and urgency. Pertinent negatives include no sweats, no nausea, no vomiting, no discharge and no flank pain. He has tried antibiotics (took 2 keflex then stopped) for the symptoms.    Past Medical History:  Diagnosis Date  . Anemia    takes Ferrous Sulfate daily  . Arthritis    "all over"  . Balance problem 01/2014  . CAP (community acquired pneumonia) 09/18/2014  . Cervical radiculopathy due to degenerative joint disease of spine   . COPD (chronic obstructive pulmonary disease) ([place])   . Coronary artery disease, non-occlusive    a. 03/2010 Nonocclusive disease by cath, performed for ST elevations on ECG;  b. 06/2013 Lexi MV: EF 60%, no ischemia.  . Diabetes mellitus type II    takes Metformin and Lantus daily  . Diastolic CHF, chronic ([place])    a. 12/2012 EF 55-60%, diast dysfxn, triv MR, mildly dil LA/RA.  [name] History of blood transfusion 1982   "when I had stomach OR"  . History of bronchitis    1998  . History of gastric ulcer   . HTN (hypertension)    takes Diltiazem daily  . Hyperlipidemia    takes Pravastatin daily  . Joint pain   . PAF (paroxysmal atrial fibrillation) (H[facility])    Recurrent after atrial flutter (a. 07/2010 Status post caval tricuspid isthmus ablation by Dr. Midge Aver Metoprolol daily), currently controlled on flecainide plus diltiazem and  Coumadin  . Weakness    numbness and tingling both hands    Patient Active Problem List   Diagnosis Date Noted  . HTN (hypertension) 05/21/2017  . Diabetes mellitus type II 05/21/2017  . Diastolic CHF, chronic ([place]) 05/21/2017  . COPD (chronic obstructive pulmonary disease) ([place]) 05/21/2017  . H/O atrial flutter 05/21/2017  . Coronary disease/nonobstructive 05/21/2017  . Acute blood loss anemia 10/02/2016  . Abdominal pain 10/02/2016  . Constipation 10/02/2016  . Left lower quadrant pain   . Coagulopathy ([place])   . Heme positive stool   . Symptomatic anemia 07/10/2016  . Syncope 05/15/2015  . Cervical disc disease 05/15/2015  . Abnormal urinalysis 05/15/2015  . Anemia 05/15/2015  . History of CVA (cerebrovascular accident) 04/03/2014  . Near syncope 02/15/2014  . Pre-syncope 02/15/2014  . Benign neoplasm of rectum and anal canal 12/02/2013  . Upper GI bleeding 11/30/2013  . Tobacco use disorder 11/30/2013  . PAF (paroxysmal atrial fibrillation), maintaining SR; CHA2DS2Vasc = 7 08/15/2013  . Anticoagulation goal of INR 2 to 3, for PAF - CHA2DS2Vasc = 7 08/15/2013  . Type 2 diabetes mellitus ([place]) 01/22/2013  . Tobacco abuse counseling 12/15/2012  . Hyperlipidemia   . Obesity (BMI 30-39.9) 12/14/2012  . Atrial flutter - status post CTI ablation 07/29/2010  . Essential hypertension 07/29/2010  . Chronic diastolic congestive heart failure ([place]) 07/29/2010  . Coronary artery disease, non-occlusive 07/29/2010    Past Surgical  History:  Procedure Laterality Date  . ATRIAL ABLATION SURGERY  08/05/10   CTI ablation for atrial flutter by JA  . CARDIAC CATHETERIZATION  2012   nl LV function, no occlusive CAD, PAF  . CARDIOVERSION  12/07/2010    Successful direct current cardioversion with atrial fibrillation to normal sinus rhythm  . CARPAL TUNNEL RELEASE Bilateral 01/30/2014   Procedure: BILATERAL CARPAL TUNNEL RELEASE;  Surgeon: Marianna Payment, MD;  Location: [facility];   Service: Orthopedics;  Laterality: Bilateral;  . CATARACT EXTRACTION W/ INTRAOCULAR LENS  IMPLANT, BILATERAL Bilateral   . COLONOSCOPY [name] 12/02/2013   Procedure: COLONOSCOPY;  Surgeon: Irene Shipper, MD;  Location: [facility];  Service: Endoscopy;  Laterality: [name];  . ESOPHAGOGASTRODUODENOSCOPY [name] 09/22/2014   Procedure: ESOPHAGOGASTRODUODENOSCOPY (EGD);  Surgeon: Ronald Lobo, MD;  Location: [facility] ENDOSCOPY;  Service: Endoscopy;  Laterality: [name];  . ESOPHAGOGASTRODUODENOSCOPY [name] 07/11/2016   Procedure: ESOPHAGOGASTRODUODENOSCOPY (EGD);  Surgeon: Doran Stabler, MD;  Location: [facility] ENDOSCOPY;  Service: Endoscopy;  Laterality: [name];  . INCISION AND DRAINAGE ABSCESS / HEMATOMA OF BURSA / KNEE / THIGH Left 1998   knee  . KNEE BURSECTOMY Left 1998  . LAPAROSCOPIC CHOLECYSTECTOMY  03/2010  . NM MYOVIEW LTD  07/22/2013   Normal EF ~60%, no ischemia or infarction.  [name] PARTIAL GASTRECTOMY  1982   subtotal; "took out 30% for ulcers"  . TRANSTHORACIC ECHOCARDIOGRAM  02/16/2014   EF 60%, no RWMA. - otherwise normal  . YAG LASER APPLICATION Bilateral         Home Medications    Prior to Admission medications   Medication Sig Start Date End Date Taking? Authorizing Provider  aspirin EC 81 MG tablet Take 81 mg by mouth daily.    [provider]  cephALEXin (KEFLEX) 500 MG capsule Take 1 capsule (500 mg total) by mouth 2 (two) times daily. 07/27/17   Ripley Fraise, MD  co-enzyme Q-10 30 MG capsule Take 30 mg by mouth daily.     [provider]  diltiazem (CARDIZEM CD) 120 MG 24 hr capsule Take 1 capsule (120 mg total) by mouth daily. 05/04/17   Leonie Man, MD  flecainide (TAMBOCOR) 150 MG tablet Take 1.5 tablets (225 mg total) by mouth 2 (two) times daily. 05/04/17   Leonie Man, MD  hydrocortisone cream 1 % Apply 1 application topically 2 (two) times daily.  01/02/17   [provider]  Insulin Glargine (LANTUS) 100 UNIT/ML Solostar Pen Inject 8 Units into the skin 2  (two) times daily. 11/30/16   Domenic Polite, MD  loratadine (CLARITIN) 10 MG tablet Take 10 mg by mouth every evening.     [provider]  metFORMIN (GLUCOPHAGE) 1000 MG tablet Take 1,000 mg by mouth 2 (two) times daily with a meal.     [provider]  metoprolol succinate (TOPROL-XL) 25 MG 24 hr tablet TAKE 1/2 TABLET  (12.5 MG TOTAL) BY MOUTH DAILY. 05/04/17   Leonie Man, MD  Multiple Vitamin (MULTIVITAMIN WITH MINERALS) TABS tablet Take 1 tablet by mouth daily.     [provider]  nitroGLYCERIN (NITROSTAT) 0.4 MG SL tablet Place 1 tablet (0.4 mg total) under the tongue every 5 (five) minutes x 3 doses as needed for chest pain. 11/19/15   Erlene Quan, PA-C  oxybutynin (DITROPAN-XL) 5 MG 24 hr tablet Take 5 mg by mouth daily. 11/02/16 11/02/17  [provider]  pantoprazole (PROTONIX) 40 MG tablet Take 1 tablet (40 mg total)  by mouth 2 (two) times daily before a meal. 11/30/16   Domenic Polite, MD  polyethylene glycol [facility] / Floria Raveling) packet Take 17 g by mouth 2 (two) times daily. Patient taking differently: Take 17 g by mouth daily as needed for moderate constipation.  10/03/16   Raiford Noble Latif, DO  pravastatin (PRAVACHOL) 40 MG tablet Take 1 tablet (40 mg total) by mouth every evening. 05/04/17   Leonie Man, MD  tamsulosin (FLOMAX) 0.4 MG CAPS capsule Take 0.4 mg by mouth daily. 02/20/17   [provider]    Family History Family History  Problem Relation Age of Onset  . Cancer Mother   . Heart attack Father   . Heart attack Brother     Social History Social History   Tobacco Use  . Smoking status: Current Every Day Smoker    Packs/day: 0.50    Years: 55.00    Pack years: 27.50    Types: Cigarettes  . Smokeless tobacco: Never Used  . Tobacco comment: He's been smoking between 0.5 and 2 ppd since age 39.  Substance Use Topics  . Alcohol use: No    Alcohol/week: 0.0 oz    Comment: "quit drinking in 1986"  . Drug use:  No     Allergies   Cyclobenzaprine   Review of Systems Review of Systems  Constitutional: Positive for chills, fatigue and fever. Negative for diaphoresis.  HENT: Positive for congestion and rhinorrhea.   Respiratory: Positive for cough. Negative for chest tightness, shortness of breath and wheezing.   Cardiovascular: Negative for chest pain and palpitations.  Gastrointestinal: Negative for abdominal pain, constipation, diarrhea, nausea and vomiting.  Genitourinary: Positive for dysuria, frequency and urgency. Negative for difficulty urinating and flank pain.  Musculoskeletal: Negative for back pain, neck pain and neck stiffness.  Neurological: Positive for light-headedness. Negative for syncope and headaches.  Psychiatric/Behavioral: Negative for agitation.  All other systems reviewed and are negative.    Physical Exam Updated Vital Signs There were no vitals taken for this visit.  Physical Exam  Constitutional: He is oriented to person, place, and time. He appears well-developed and well-nourished. No distress.  HENT:  Head: Normocephalic and atraumatic.  Mouth/Throat: Oropharynx is clear and moist.  Eyes: Pupils are equal, round, and reactive to light. Conjunctivae and EOM are normal.  Neck: Normal range of motion.  Cardiovascular: Normal rate and intact distal pulses.  No murmur heard. Pulmonary/Chest: Breath sounds normal. No respiratory distress. He has no wheezes. He has no rales. He exhibits no tenderness.  Abdominal: Soft. Bowel sounds are normal. He exhibits no distension. There is no tenderness.  Musculoskeletal: He exhibits no edema or tenderness.  Neurological: He is alert and oriented to person, place, and time. No sensory deficit. He exhibits normal muscle tone.  Skin: Capillary refill takes less than 2 seconds. He is not diaphoretic. No erythema. No pallor.  Psychiatric: He has a normal mood and affect.  Nursing note and vitals reviewed.    ED Treatments  / Results  Labs (all labs ordered are listed, but only abnormal results are displayed) Labs Reviewed  CBC WITH DIFFERENTIAL/PLATELET - Abnormal; Notable for the following components:      Result Value   Hemoglobin 12.5 (*)    HCT 38.2 (*)    RDW 20.6 (*)    Lymphs Abs 0.3 (*)    All other components within normal limits  COMPREHENSIVE METABOLIC PANEL - Abnormal; Notable for the following components:   CO2 21 (*)  Glucose, Bld 130 (*)    All other components within normal limits  URINALYSIS, ROUTINE W REFLEX MICROSCOPIC - Abnormal; Notable for the following components:   Ketones, ur 5 (*)    Leukocytes, UA MODERATE (*)    Bacteria, UA RARE (*)    All other components within normal limits  URINE CULTURE  CULTURE, BLOOD (ROUTINE X 2)  CULTURE, BLOOD (ROUTINE X 2)  LIPASE, BLOOD  I-STAT CG4 LACTIC ACID, ED    EKG None  Radiology Dg Chest 2 View  Result Date: 07/29/2017 CLINICAL DATA:  77 year old male with cough. EXAM: CHEST - 2 VIEW COMPARISON:  Chest radiograph dated 07/27/2017 FINDINGS: There is emphysematous changes of the lungs. No focal consolidation, pleural effusion, or pneumothorax. The cardiac silhouette is within normal limits. No acute osseous pathology. Surgical clips at the gastroesophageal junction and right upper quadrant cholecystectomy clips. IMPRESSION: No active cardiopulmonary disease. Electronically Signed   By: Anner Crete M.D.   On: 07/29/2017 23:46    Procedures Procedures (including critical care time)  Medications Ordered in ED Medications  sodium chloride 0.9 % bolus 500 mL (500 mLs Intravenous New Bag/Given 07/30/17 0046)  sodium chloride 0.9 % bolus 1,000 mL (0 mLs Intravenous Stopped 07/30/17 0013)  cefTRIAXone (ROCEPHIN) 1 g in sodium chloride 0.9 % 100 mL IVPB (0 g Intravenous Stopped 07/30/17 0021)     Initial Impression / Assessment and Plan / ED Course  I have reviewed the triage vital signs and the nursing notes.  Pertinent labs &  imaging results that were available during my care of the patient were reviewed by me and considered in my medical decision making (see chart for details).     Domanick Cuccia is a 77 y.o. male with a past medical history significant for hypertension, hyperlipidemia, diabetes, CAD, prior stroke, COPD, CHF, and recent diagnosis of urinary tract infection who presents with lightheadedness, fatigue, subjective fevers and chills, and continued dysuria.  Patient reports that several days ago he was evaluated for abdominal pain and similar symptoms.  He says that he was told he had a urinary tract infection however he was prescribed Keflex.  He says he took 2 pills of this and then he thought it made him feel worse.  He was concerned about interactions with other medications so he stopped taking them.  He says that he is continued to have the subjective fevers and chills but is now feeling very fatigued and tired.  He reports that he has had no nausea or vomiting.  No conservation, diarrhea, no chest pain or shortness of breath.  He does report continued congestion and cough.  He reports no current abdominal pain.  On exam, lungs are clear.  Chest is nontender.  Patient has no significant lower extremity edema.  He does report that his extremities get tingly when he is anemic and is dehydrated.  He had no focal neurologic deficits on my initial examination.  Abdomen is nontender.  No CVA tenderness present.  Patient's blood pressure initially documented in the 90s.  It was repeated and it was around 630 systolic for me.  Patient will have rectal temperature performed and lab work-up to look for worsening infection.  Patient will be given fluids during initial work-up.  Pt's urine showed similar findings of bacteria and leukocytes.  Patient given antibiotics for UTI.  Patient's chest x-ray was reassuring other laboratory testing similar to prior.  Patient will finish his liter of fluids and have  orthostatics checked.  Patient  is feeling better and blood pressure is reassuring, patient will likely be discharged with antibiotics.  If patient is more hypotensive, patient may need admission for rehydration and further IV antibiotic treatment of his UTI.  Care transferred to Dr. Randal Buba while awaiting reassessment.  Final Clinical Impressions(s) / ED Diagnoses   Final diagnoses:  Fatigue, unspecified type  Lower urinary tract infectious disease     Clinical Impression: 1. Fatigue, unspecified type   2. Lower urinary tract infectious disease     Disposition: Awaiting reassessment after fluids and antibiotics.      Dezi Schaner, Gwenyth Allegra, MD 07/30/17 0100    Henok Heacock, Gwenyth Allegra, MD 07/30/17 [phone]

## 2017-07-30 MED ORDER — SULFAMETHOXAZOLE-TRIMETHOPRIM 800-160 MG PO TABS: 1.0000 | ORAL_TABLET | Freq: Two times a day (BID) | ORAL | 0 refills | Status: AC

## 2017-07-30 MED ORDER — SODIUM CHLORIDE 0.9 % IV BOLUS
500.0000 mL | Freq: Once | INTRAVENOUS | Status: AC
  Administered 2017-07-30: 500 mL via INTRAVENOUS

## 2017-07-31 LAB — URINE CULTURE: Culture: NO GROWTH

## 2017-08-01 DIAGNOSIS — M9902 Segmental and somatic dysfunction of thoracic region: Secondary | ICD-10-CM | POA: Diagnosis not present

## 2017-08-01 DIAGNOSIS — M5033 Other cervical disc degeneration, cervicothoracic region: Secondary | ICD-10-CM | POA: Diagnosis not present

## 2017-08-01 DIAGNOSIS — M9901 Segmental and somatic dysfunction of cervical region: Secondary | ICD-10-CM | POA: Diagnosis not present

## 2017-08-01 DIAGNOSIS — M5384 Other specified dorsopathies, thoracic region: Secondary | ICD-10-CM | POA: Diagnosis not present

## 2017-08-04 ENCOUNTER — Other Ambulatory Visit: Payer: Self-pay

## 2017-08-04 ENCOUNTER — Encounter (HOSPITAL_COMMUNITY): Payer: Self-pay | Admitting: Emergency Medicine

## 2017-08-04 DIAGNOSIS — I2510 Atherosclerotic heart disease of native coronary artery without angina pectoris: Secondary | ICD-10-CM | POA: Diagnosis not present

## 2017-08-04 DIAGNOSIS — Z7901 Long term (current) use of anticoagulants: Secondary | ICD-10-CM | POA: Diagnosis not present

## 2017-08-04 DIAGNOSIS — F17210 Nicotine dependence, cigarettes, uncomplicated: Secondary | ICD-10-CM | POA: Insufficient documentation

## 2017-08-04 DIAGNOSIS — Z7982 Long term (current) use of aspirin: Secondary | ICD-10-CM | POA: Insufficient documentation

## 2017-08-04 DIAGNOSIS — I5030 Unspecified diastolic (congestive) heart failure: Secondary | ICD-10-CM | POA: Diagnosis not present

## 2017-08-04 DIAGNOSIS — Z79899 Other long term (current) drug therapy: Secondary | ICD-10-CM | POA: Diagnosis not present

## 2017-08-04 DIAGNOSIS — E1140 Type 2 diabetes mellitus with diabetic neuropathy, unspecified: Secondary | ICD-10-CM | POA: Insufficient documentation

## 2017-08-04 DIAGNOSIS — R200 Anesthesia of skin: Secondary | ICD-10-CM | POA: Diagnosis not present

## 2017-08-04 DIAGNOSIS — I110 Hypertensive heart disease with heart failure: Secondary | ICD-10-CM | POA: Diagnosis not present

## 2017-08-04 DIAGNOSIS — D649 Anemia, unspecified: Secondary | ICD-10-CM | POA: Diagnosis not present

## 2017-08-04 DIAGNOSIS — E1141 Type 2 diabetes mellitus with diabetic mononeuropathy: Secondary | ICD-10-CM | POA: Diagnosis not present

## 2017-08-04 LAB — BASIC METABOLIC PANEL
ANION GAP: 10 (ref 5–15)
BUN: 14 mg/dL (ref 6–20)
CHLORIDE: 105 mmol/L (ref 101–111)
CO2: 24 mmol/L (ref 22–32)
Calcium: 9 mg/dL (ref 8.9–10.3)
Creatinine, Ser: 0.86 mg/dL (ref 0.61–1.24)
GFR calc non Af Amer: >60 mL/min (ref >60)
Glucose, Bld: 87 mg/dL (ref 65–99)
Potassium: 3.9 mmol/L (ref 3.5–5.1)
Sodium: 139 mmol/L (ref 135–145)

## 2017-08-04 LAB — URINALYSIS, ROUTINE W REFLEX MICROSCOPIC
BACTERIA UA: NONE SEEN
Bilirubin Urine: NEGATIVE
Glucose, UA: NEGATIVE mg/dL
Ketones, ur: NEGATIVE mg/dL
Leukocytes, UA: NEGATIVE
Nitrite: NEGATIVE
Protein, ur: NEGATIVE mg/dL
SPECIFIC GRAVITY, URINE: 1.012 (ref 1.005–1.030)
pH: 7 (ref 5.0–8.0)

## 2017-08-04 LAB — CBC
HEMATOCRIT: 37.7 % — AB (ref 39.0–52.0)
HEMOGLOBIN: 12.2 g/dL — AB (ref 13.0–17.0)
MCH: 29.2 pg (ref 26.0–34.0)
MCHC: 32.4 g/dL (ref 30.0–36.0)
MCV: 90.2 fL (ref 78.0–100.0)
Platelets: 264 10*3/uL (ref 150–400)
RBC: 4.18 MIL/uL — AB (ref 4.22–5.81)
RDW: 19.6 % — ABNORMAL HIGH (ref 11.5–15.5)
WBC: 6.8 10*3/uL (ref 4.0–10.5)

## 2017-08-04 LAB — CULTURE, BLOOD (ROUTINE X 2)
Culture: NO GROWTH
Culture: NO GROWTH

## 2017-08-04 NOTE — ED Triage Notes (Signed)
Pt in with complaints of numbness and weakness all over that started this morning and worsened this afternoon. Patient states he tried to walk it off but it didn't work. Patient seen for this previously. Patient states when his hemoglobin gets low this tends to happen. Patient ambulatory with cane to triage room.

## 2017-08-05 ENCOUNTER — Emergency Department (HOSPITAL_COMMUNITY)
Admission: EM | Admit: 2017-08-05 | Discharge: 2017-08-05 | Disposition: A | Discharge status: Home / Self Care | Payer: Medicare Other | Attending: Emergency Medicine | Admitting: Emergency Medicine

## 2017-08-05 DIAGNOSIS — E1142 Type 2 diabetes mellitus with diabetic polyneuropathy: Secondary | ICD-10-CM

## 2017-08-05 DIAGNOSIS — D649 Anemia, unspecified: Secondary | ICD-10-CM

## 2017-08-05 NOTE — ED Provider Notes (Signed)
[facility] DEPT Provider Note   CSN: [id number] Arrival date & time: 08/04/17  2010     History   Chief Complaint Chief Complaint  Patient presents with  . Numbness    HPI [name] is a 77 y.o. male.  The history is provided by the patient.  He has a history of diabetes, hypertension, hyperlipidemia, COPD, coronary artery disease and comes in complaining of numbness in his hands and feet.  He states he gets this whenever his hemoglobin gets low and he needs a blood transfusion.  He feels a little bit lightheaded on standing,, but he denies any chest pain, heaviness, tightness, pressure.  There is been no nausea or vomiting.  Past Medical History:  Diagnosis Date  . Anemia    takes Ferrous Sulfate daily  . Arthritis    "all over"  . Balance problem 01/2014  . CAP (community acquired pneumonia) 09/18/2014  . Cervical radiculopathy due to degenerative joint disease of spine   . COPD (chronic obstructive pulmonary disease) ([place])   . Coronary artery disease, non-occlusive    a. 03/2010 Nonocclusive disease by cath, performed for ST elevations on ECG;  b. 06/2013 Lexi MV: EF 60%, no ischemia.  . Diabetes mellitus type II    takes Metformin and Lantus daily  . Diastolic CHF, chronic ([place])    a. 12/2012 EF 55-60%, diast dysfxn, triv MR, mildly dil LA/RA.  [name] History of blood transfusion 1982   "when I had stomach OR"  . History of bronchitis    1998  . History of gastric ulcer   . HTN (hypertension)    takes Diltiazem daily  . Hyperlipidemia    takes Pravastatin daily  . Joint pain   . PAF (paroxysmal atrial fibrillation) (HCC)    Recurrent after atrial flutter (a. 07/2010 Status post caval tricuspid isthmus ablation by Dr. Midge Aver Metoprolol daily), currently controlled on flecainide plus diltiazem and Coumadin  . Weakness    numbness and tingling both hands    Patient Active Problem List   Diagnosis Date Noted  . HTN  (hypertension) 05/21/2017  . Diabetes mellitus type II 05/21/2017  . Diastolic CHF, chronic ([place]) 05/21/2017  . COPD (chronic obstructive pulmonary disease) ([place]) 05/21/2017  . H/O atrial flutter 05/21/2017  . Coronary disease/nonobstructive 05/21/2017  . Acute blood loss anemia 10/02/2016  . Abdominal pain 10/02/2016  . Constipation 10/02/2016  . Left lower quadrant pain   . Coagulopathy ([place])   . Heme positive stool   . Symptomatic anemia 07/10/2016  . Syncope 05/15/2015  . Cervical disc disease 05/15/2015  . Abnormal urinalysis 05/15/2015  . Anemia 05/15/2015  . History of CVA (cerebrovascular accident) 04/03/2014  . Near syncope 02/15/2014  . Pre-syncope 02/15/2014  . Benign neoplasm of rectum and anal canal 12/02/2013  . Upper GI bleeding 11/30/2013  . Tobacco use disorder 11/30/2013  . PAF (paroxysmal atrial fibrillation), maintaining SR; CHA2DS2Vasc = 7 08/15/2013  . Anticoagulation goal of INR 2 to 3, for PAF - CHA2DS2Vasc = 7 08/15/2013  . Type 2 diabetes mellitus ([place]) 01/22/2013  . Tobacco abuse counseling 12/15/2012  . Hyperlipidemia   . Obesity (BMI 30-39.9) 12/14/2012  . Atrial flutter - status post CTI ablation 07/29/2010  . Essential hypertension 07/29/2010  . Chronic diastolic congestive heart failure ([place]) 07/29/2010  . Coronary artery disease, non-occlusive 07/29/2010    Past Surgical History:  Procedure Laterality Date  . ATRIAL ABLATION SURGERY  08/05/10   CTI ablation for atrial flutter  by Greggory Brandy  . CARDIAC CATHETERIZATION  2012   nl LV function, no occlusive CAD, PAF  . CARDIOVERSION  12/07/2010    Successful direct current cardioversion with atrial fibrillation to normal sinus rhythm  . CARPAL TUNNEL RELEASE Bilateral 01/30/2014   Procedure: BILATERAL CARPAL TUNNEL RELEASE;  Surgeon: Marianna Payment, MD;  Location: [facility];  Service: Orthopedics;  Laterality: Bilateral;  . CATARACT EXTRACTION W/ INTRAOCULAR LENS  IMPLANT, BILATERAL Bilateral   .  COLONOSCOPY [name] 12/02/2013   Procedure: COLONOSCOPY;  Surgeon: Irene Shipper, MD;  Location: [facility];  Service: Endoscopy;  Laterality: [name];  . ESOPHAGOGASTRODUODENOSCOPY [name] 09/22/2014   Procedure: ESOPHAGOGASTRODUODENOSCOPY (EGD);  Surgeon: Ronald Lobo, MD;  Location[facility] ENDOSCOPY;  Service: Endoscopy;  Laterality: [name];  . ESOPHAGOGASTRODUODENOSCOPY [name] 07/11/2016   Procedure: ESOPHAGOGASTRODUODENOSCOPY (EGD);  Surgeon: Doran Stabler, MD;  Location: [facility] ENDOSCOPY;  Service: Endoscopy;  Laterality: [name];  . INCISION AND DRAINAGE ABSCESS / HEMATOMA OF BURSA / KNEE / THIGH Left 1998   knee  . KNEE BURSECTOMY Left 1998  . LAPAROSCOPIC CHOLECYSTECTOMY  03/2010  . NM MYOVIEW LTD  07/22/2013   Normal EF ~60%, no ischemia or infarction.  [name] PARTIAL GASTRECTOMY  1982   subtotal; "took out 30% for ulcers"  . TRANSTHORACIC ECHOCARDIOGRAM  02/16/2014   EF 60%, no RWMA. - otherwise normal  . YAG LASER APPLICATION Bilateral         Home Medications    Prior to Admission medications   Medication Sig Start Date End Date Taking? Authorizing Provider  aspirin EC 81 MG tablet Take 81 mg by mouth daily.    [provider]  cephALEXin (KEFLEX) 500 MG capsule Take 1 capsule (500 mg total) by mouth 2 (two) times daily. 07/27/17   Ripley Fraise, MD  co-enzyme Q-10 30 MG capsule Take 30 mg by mouth daily.     [provider]  diltiazem (CARDIZEM CD) 120 MG 24 hr capsule Take 1 capsule (120 mg total) by mouth daily. 05/04/17   Leonie Man, MD  flecainide (TAMBOCOR) 150 MG tablet Take 1.5 tablets (225 mg total) by mouth 2 (two) times daily. 05/04/17   Leonie Man, MD  hydrocortisone cream 1 % Apply 1 application topically 2 (two) times daily.  01/02/17   [provider]  Insulin Glargine (LANTUS) 100 UNIT/ML Solostar Pen Inject 8 Units into the skin 2 (two) times daily. 11/30/16   Domenic Polite, MD  loratadine (CLARITIN) 10 MG tablet Take 10 mg by mouth every evening.      [provider]  metFORMIN (GLUCOPHAGE) 1000 MG tablet Take 1,000 mg by mouth 2 (two) times daily with a meal.     [provider]  metoprolol succinate (TOPROL-XL) 25 MG 24 hr tablet TAKE 1/2 TABLET  (12.5 MG TOTAL) BY MOUTH DAILY. 05/04/17   Leonie Man, MD  Multiple Vitamin (MULTIVITAMIN WITH MINERALS) TABS tablet Take 1 tablet by mouth daily.     [provider]  nitroGLYCERIN (NITROSTAT) 0.4 MG SL tablet Place 1 tablet (0.4 mg total) under the tongue every 5 (five) minutes x 3 doses as needed for chest pain. 11/19/15   Erlene Quan, PA-C  oxybutynin (DITROPAN-XL) 5 MG 24 hr tablet Take 5 mg by mouth daily. 11/02/16 11/02/17  [provider]  pantoprazole (PROTONIX) 40 MG tablet Take 1 tablet (40 mg total) by mouth 2 (two) times daily before a meal. 11/30/16   Domenic Polite, MD  polyethylene glycol [facility] /  GLYCOLAX) packet Take 17 g by mouth 2 (two) times daily. Patient taking differently: Take 17 g by mouth daily as needed for moderate constipation.  10/03/16   Raiford Noble Latif, DO  pravastatin (PRAVACHOL) 40 MG tablet Take 1 tablet (40 mg total) by mouth every evening. 05/04/17   Leonie Man, MD  sulfamethoxazole-trimethoprim (BACTRIM DS,SEPTRA DS) 800-160 MG tablet Take 1 tablet by mouth 2 (two) times daily for 7 days. 07/30/17 08/06/17  Palumbo, April, MD  tamsulosin (FLOMAX) 0.4 MG CAPS capsule Take 0.4 mg by mouth daily. 02/20/17   [provider]    Family History Family History  Problem Relation Age of Onset  . Cancer Mother   . Heart attack Father   . Heart attack Brother     Social History Social History   Tobacco Use  . Smoking status: Current Every Day Smoker    Packs/day: 0.50    Years: 55.00    Pack years: 27.50    Types: Cigarettes  . Smokeless tobacco: Never Used  . Tobacco comment: He's been smoking between 0.5 and 2 ppd since age 77.  Substance Use Topics  . Alcohol use: No    Alcohol/week: 0.0 oz    Comment:  "quit drinking in 1986"  . Drug use: No     Allergies   Cyclobenzaprine   Review of Systems Review of Systems  All other systems reviewed and are negative.    Physical Exam Updated Vital Signs BP 113/70 (BP Location: Left Arm)   Pulse 82   Temp 98.6 F (37 C) (Oral)   Resp 15   Ht 5\' 6"  (1.676 m)   Wt 78.5 kg (173 lb)   SpO2 95%   BMI 27.92 kg/m   Physical Exam  Nursing note and vitals reviewed.  77 year old male, resting comfortably and in no acute distress. Vital signs are normal. Oxygen saturation is 95%, which is normal. Head is normocephalic and atraumatic. PERRLA, EOMI. Oropharynx is clear. Neck is nontender and supple without adenopathy or JVD. Back is nontender and there is no CVA tenderness. Lungs are clear without rales, wheezes, or rhonchi. Chest is nontender. Heart has regular rate and rhythm without murmur. Abdomen is soft, flat, nontender without masses or hepatosplenomegaly and peristalsis is normoactive. Extremities have no cyanosis or edema, full range of motion is present. Skin is warm and dry without rash. Neurologic: Mental status is normal, cranial nerves are intact, there are no motor deficits.  Stocking-glove hypnesthesia present consistent with diabetic peripheral neuropathy  ED Treatments / Results  Labs (all labs ordered are listed, but only abnormal results are displayed) Labs Reviewed  CBC - Abnormal; Notable for the following components:      Result Value   RBC 4.18 (*)    Hemoglobin 12.2 (*)    HCT 37.7 (*)    RDW 19.6 (*)    All other components within normal limits  URINALYSIS, ROUTINE W REFLEX MICROSCOPIC - Abnormal; Notable for the following components:   Color, Urine STRAW (*)    Hgb urine dipstick SMALL (*)    All other components within normal limits  BASIC METABOLIC PANEL  CBG MONITORING, ED   Procedures Procedures   Medications Ordered in ED Medications - No data to display   Initial Impression / Assessment and  Plan / ED Course  I have reviewed the triage vital signs and the nursing notes.  Pertinent lab results that were available during my care of the patient were reviewed  by me and considered in my medical decision making (see chart for details).  Numbness in hands and feet which appears to be secondary to diabetic peripheral neuropathy.  Old records are reviewed, and he has had difficulty with anemia.  However, hemoglobin today is at his baseline, which is 12.2.  Patient is advised of this.  We will check orthostatic vital signs, but I feel his symptoms are clearly related to diabetic neuropathy.  Since he is not having significant pain, no indication for initiating gabapentin.    Orthostatic vital signs show no significant change in blood pressure or heart rate.  He is to follow-up with PCP.  Final Clinical Impressions(s) / ED Diagnoses   Final diagnoses:  Diabetic peripheral neuropathy (HCC)  Normochromic normocytic anemia    ED Discharge Orders    None       Delora Fuel, MD 69/50/72 [phone]

## 2017-08-08 ENCOUNTER — Encounter: Payer: Self-pay | Admitting: Family

## 2017-08-08 ENCOUNTER — Inpatient Hospital Stay: Payer: Medicare Other

## 2017-08-08 ENCOUNTER — Other Ambulatory Visit: Payer: Self-pay

## 2017-08-08 ENCOUNTER — Inpatient Hospital Stay: Payer: Medicare Other | Attending: Hematology & Oncology | Admitting: Family

## 2017-08-08 VITALS — BP 129/58 | HR 63 | Temp 97.6°F | Resp 20 | Wt 167.8 lb

## 2017-08-08 DIAGNOSIS — K922 Gastrointestinal hemorrhage, unspecified: Secondary | ICD-10-CM | POA: Diagnosis not present

## 2017-08-08 DIAGNOSIS — D508 Other iron deficiency anemias: Secondary | ICD-10-CM

## 2017-08-08 DIAGNOSIS — Z79899 Other long term (current) drug therapy: Secondary | ICD-10-CM | POA: Diagnosis not present

## 2017-08-08 DIAGNOSIS — D500 Iron deficiency anemia secondary to blood loss (chronic): Secondary | ICD-10-CM | POA: Diagnosis not present

## 2017-08-08 DIAGNOSIS — Z794 Long term (current) use of insulin: Secondary | ICD-10-CM | POA: Insufficient documentation

## 2017-08-08 LAB — IRON AND TIBC
IRON: 60 ug/dL (ref 42–163)
SATURATION RATIOS: 19 % — AB (ref 42–163)
TIBC: 325 ug/dL (ref 202–409)
UIBC: 265 ug/dL

## 2017-08-08 LAB — CMP (CANCER CENTER ONLY)
ALBUMIN: 3.9 g/dL (ref 3.5–5.0)
ALT: 50 U/L (ref 0–55)
AST: 40 U/L — ABNORMAL HIGH (ref 5–34)
Alkaline Phosphatase: 84 U/L (ref 40–150)
Anion gap: 6 (ref 3–11)
BUN: 11 mg/dL (ref 7–26)
CHLORIDE: 104 mmol/L (ref 98–109)
CO2: 28 mmol/L (ref 22–29)
Calcium: 9.4 mg/dL (ref 8.4–10.4)
Creatinine: 0.88 mg/dL (ref 0.70–1.30)
GFR, Est AFR Am: >60 mL/min (ref >60)
GLUCOSE: 128 mg/dL (ref 70–140)
POTASSIUM: 4.5 mmol/L (ref 3.5–5.1)
Sodium: 138 mmol/L (ref 136–145)
Total Bilirubin: <0.2 mg/dL — ABNORMAL LOW (ref 0.2–1.2)
Total Protein: 7 g/dL (ref 6.4–8.3)

## 2017-08-08 LAB — CBC WITH DIFFERENTIAL (CANCER CENTER ONLY)
BASOS ABS: 0.1 10*3/uL (ref 0.0–0.1)
BASOS PCT: 1 %
EOS PCT: 4 %
Eosinophils Absolute: 0.2 10*3/uL (ref 0.0–0.5)
HCT: 41.7 % (ref 38.7–49.9)
Hemoglobin: 13.5 g/dL (ref 13.0–17.1)
Lymphocytes Relative: 16 %
Lymphs Abs: 0.8 10*3/uL — ABNORMAL LOW (ref 0.9–3.3)
MCH: 29.9 pg (ref 28.0–33.4)
MCHC: 32.4 g/dL (ref 32.0–35.9)
MCV: 92.5 fL (ref 82.0–98.0)
MONO ABS: 0.6 10*3/uL (ref 0.1–0.9)
Monocytes Relative: 11 %
Neutro Abs: 3.7 10*3/uL (ref 1.5–6.5)
Neutrophils Relative %: 68 %
PLATELETS: 292 10*3/uL (ref 145–400)
RBC: 4.51 MIL/uL (ref 4.20–5.70)
RDW: 18.8 % — AB (ref 11.1–15.7)
WBC Count: 5.4 10*3/uL (ref 4.0–10.0)

## 2017-08-08 LAB — RETICULOCYTES
RBC.: 4.54 MIL/uL (ref 4.20–5.82)
RETIC COUNT ABSOLUTE: 104.4 10*3/uL — AB (ref 34.8–93.9)
Retic Ct Pct: 2.3 % — ABNORMAL HIGH (ref 0.8–1.8)

## 2017-08-08 LAB — FERRITIN: FERRITIN: 49 ng/mL (ref 22–316)

## 2017-08-08 NOTE — Progress Notes (Signed)
Hematology and Oncology Follow Up Visit  [name] [id number] 1941-03-16 77 y.o. 08/08/2017   Principle Diagnosis:  Iron deficiency anemia secondary to intermittent GI bleed  Current Therapy:   IV iron as indicated Supportive transfusions as needed   Interim History: [name] is here today with his daughter for follow-up. He is doing quite well and has no complaints at this time. He received 2 doses of IV iron in April and has had a nice response. Hgb is now 13.5.  He denies any episodes of bleeding, no bruising or petechiae.  No lymphadenopathy noted on exam.  No fever, chills, n/v, cough, rash, dizziness, SOB, chest pain, palpitations, abdominal pain or changes in bowel or bladder habits.  No swelling or tenderness in his extremities at this time. He has numbness and tingling in his hands and feet secondary chronic neck and lower back issues.   He has maintained a good appetite and is staying well hydrated. His weight is stable.   ECOG Performance Status: 1 - Symptomatic but completely ambulatory  Medications:  Allergies as of 08/08/2017      Reactions   Cyclobenzaprine Other (See Comments)   Unsteady gait      Medication List        Accurate as of 08/08/17  9:36 AM. Always use your most recent med list.          aspirin EC 81 MG tablet Take 81 mg by mouth daily.   cephALEXin 500 MG capsule Commonly known as:  KEFLEX Take 1 capsule (500 mg total) by mouth 2 (two) times daily.   co-enzyme Q-10 30 MG capsule Take 30 mg by mouth daily.   diltiazem 120 MG 24 hr capsule Commonly known as:  CARDIZEM CD Take 1 capsule (120 mg total) by mouth daily.   flecainide 150 MG tablet Commonly known as:  TAMBOCOR Take 1.5 tablets (225 mg total) by mouth 2 (two) times daily.   HYDROcodone-acetaminophen 5-325 MG tablet Commonly known as:  NORCO/VICODIN Take 1 tablet by mouth every 8 (eight) hours as needed for moderate pain.   hydrocortisone cream 1 % Apply 1  application topically 2 (two) times daily.   Insulin Glargine 100 UNIT/ML Solostar Pen Commonly known as:  LANTUS Inject 8 Units into the skin 2 (two) times daily.   loratadine 10 MG tablet Commonly known as:  CLARITIN Take 10 mg by mouth every evening.   metFORMIN 1000 MG tablet Commonly known as:  GLUCOPHAGE Take 1,000 mg by mouth 2 (two) times daily with a meal.   metoprolol succinate 25 MG 24 hr tablet Commonly known as:  TOPROL-XL TAKE 1/2 TABLET  (12.5 MG TOTAL) BY MOUTH DAILY.   multivitamin with minerals Tabs tablet Take 1 tablet by mouth 2 (two) times daily.   nitroGLYCERIN 0.4 MG SL tablet Commonly known as:  NITROSTAT Place 1 tablet (0.4 mg total) under the tongue every 5 (five) minutes x 3 doses as needed for chest pain.   oxybutynin 5 MG 24 hr tablet Commonly known as:  DITROPAN-XL Take 5 mg by mouth daily.   pantoprazole 40 MG tablet Commonly known as:  PROTONIX Take 1 tablet (40 mg total) by mouth 2 (two) times daily before a meal.   polyethylene glycol packet Commonly known as:  MIRALAX / GLYCOLAX Take 17 g by mouth 2 (two) times daily.   pravastatin 40 MG tablet Commonly known as:  PRAVACHOL Take 1 tablet (40 mg total) by mouth every evening.   tamsulosin 0.4  MG Caps capsule Commonly known as:  FLOMAX Take 0.4 mg by mouth daily.       Allergies:  Allergies  Allergen Reactions  . Cyclobenzaprine Other (See Comments)    Unsteady gait    Past Medical History, Surgical history, Social history, and Family History were reviewed and updated.  Review of Systems: All other 10 point review of systems is negative.   Physical Exam:  weight is 167 lb 12.8 oz (76.1 kg). His oral temperature is 97.6 F (36.4 C). His blood pressure is 129/58 (abnormal) and his pulse is 63. His respiration is 20 and oxygen saturation is 99%.   Wt Readings from Last 3 Encounters:  08/08/17 167 lb 12.8 oz (76.1 kg)  08/04/17 173 lb (78.5 kg)  07/29/17 173 lb (78.5 kg)      Ocular: Sclerae unicteric, pupils equal, round and reactive to light Ear-nose-throat: Oropharynx clear, dentition fair Lymphatic: No cervical, supraclavicular or axillary adenopathy Lungs no rales or rhonchi, good excursion bilaterally Heart regular rate and rhythm, no murmur appreciated Abd soft, nontender, positive bowel sounds, no liver or spleen tip palpated on exam, no fluid wave  MSK no focal spinal tenderness, no joint edema Neuro: non-focal, well-oriented, appropriate affect Breasts: Deferred   Lab Results  Component Value Date   WBC 5.4 08/08/2017   HGB 13.5 08/08/2017   HCT 41.7 08/08/2017   MCV 92.5 08/08/2017   PLT 292 08/08/2017   Lab Results  Component Value Date   FERRITIN 43 06/27/2017   IRON 35 (L) 06/27/2017   TIBC 356 06/27/2017   UIBC 321 06/27/2017   IRONPCTSAT 10 (L) 06/27/2017   Lab Results  Component Value Date   RETICCTPCT 2.0 (H) 06/27/2017   RBC 4.51 08/08/2017   No results found for: KPAFRELGTCHN, LAMBDASER, KAPLAMBRATIO No results found for: IGGSERUM, IGA, IGMSERUM No results found for: Kathrynn Ducking, MSPIKE, SPEI   Chemistry      Component Value Date/Time   NA 139 08/04/2017 2105   NA 139 10/20/2016 1110   K 3.9 08/04/2017 2105   K 4.0 10/20/2016 1110   CL 105 08/04/2017 2105   CO2 24 08/04/2017 2105   CO2 26 10/20/2016 1110   BUN 14 08/04/2017 2105   BUN 15.2 10/20/2016 1110   CREATININE 0.86 08/04/2017 2105   CREATININE 0.92 06/27/2017 0859   CREATININE 0.8 10/20/2016 1110      Component Value Date/Time   CALCIUM 9.0 08/04/2017 2105   CALCIUM 9.6 10/20/2016 1110   ALKPHOS 75 07/29/2017 2238   ALKPHOS 85 10/20/2016 1110   AST 24 07/29/2017 2238   AST 34 06/27/2017 0859   AST 33 10/20/2016 1110   ALT 27 07/29/2017 2238   ALT 45 06/27/2017 0859   ALT 42 10/20/2016 1110   BILITOT 0.3 07/29/2017 2238   BILITOT <0.2 (L) 06/27/2017 0859   BILITOT <0.22 10/20/2016 1110       Impression and Plan: [name] is a very pleasant 77 yo caucasian gentleman with anemia secondary to intermittent GI blood loss and malabsorption on Protonix.  He is doing well and has no complaints at this time.  We will see what his iron studies show and bring him back in for infusion if needed.  We will plan to see him back in another 2 months for follow-up.  They will contact our office with any questions or concerns. We can certainly see her sooner if need be.   Laverna Peace, NP 5/15/20199:36 AM

## 2017-08-16 ENCOUNTER — Ambulatory Visit: Payer: Self-pay | Admitting: Pharmacist Clinician (PhC)/ Clinical Pharmacy Specialist

## 2017-08-16 DIAGNOSIS — I480 Paroxysmal atrial fibrillation: Secondary | ICD-10-CM

## 2017-08-17 ENCOUNTER — Inpatient Hospital Stay: Payer: Medicare Other

## 2017-08-17 VITALS — BP 104/54 | HR 64 | Temp 97.7°F | Resp 20

## 2017-08-17 DIAGNOSIS — D500 Iron deficiency anemia secondary to blood loss (chronic): Secondary | ICD-10-CM | POA: Diagnosis not present

## 2017-08-17 DIAGNOSIS — D508 Other iron deficiency anemias: Secondary | ICD-10-CM

## 2017-08-17 DIAGNOSIS — Z794 Long term (current) use of insulin: Secondary | ICD-10-CM | POA: Diagnosis not present

## 2017-08-17 DIAGNOSIS — K922 Gastrointestinal hemorrhage, unspecified: Secondary | ICD-10-CM | POA: Diagnosis not present

## 2017-08-17 DIAGNOSIS — Z79899 Other long term (current) drug therapy: Secondary | ICD-10-CM | POA: Diagnosis not present

## 2017-08-17 MED ORDER — SODIUM CHLORIDE 0.9 % IV SOLN
510.0000 mg | Freq: Once | INTRAVENOUS | Status: AC
  Administered 2017-08-17: 510 mg via INTRAVENOUS
  Filled 2017-08-17: qty 17

## 2017-08-17 MED ORDER — SODIUM CHLORIDE 0.9 % IV SOLN
INTRAVENOUS | Status: DC
  Administered 2017-08-17: 09:00:00 via INTRAVENOUS

## 2017-08-17 NOTE — Patient Instructions (Signed)

## 2017-08-23 IMAGING — CT CT RENAL STONE PROTOCOL
2 of 4 series · 16 of 46 positions shown, 18 images · non-contrast
Comparison: Prior CT from 09/01/2015.

CLINICAL DATA: Initial evaluation for acute back and bilateral
flank pain.

EXAM:
CT ABDOMEN AND PELVIS WITHOUT CONTRAST
TECHNIQUE: Multidetector CT imaging of the abdomen and pelvis was performed
following the standard protocol without IV contrast.

[Series 3: renal stone 5.0 · axial · 0.88mm/px · z∈[-444,-34]mm · 13 of 92 slices shown, 15 images]
[im 5/92  soft-tissue]
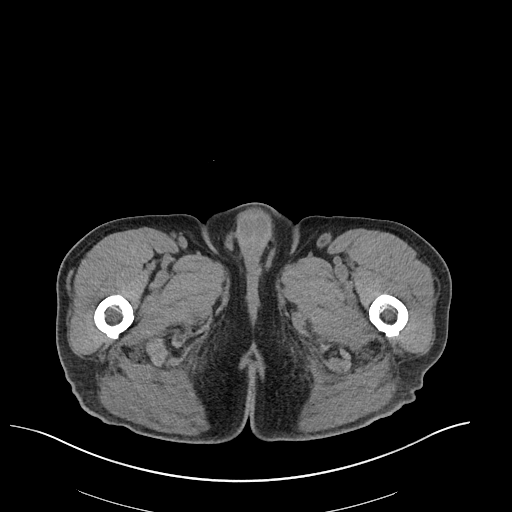
[im 5/92  bone]
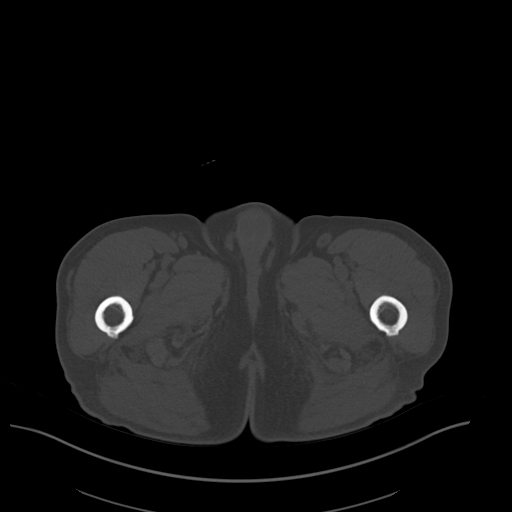
[im 13/92  soft-tissue]
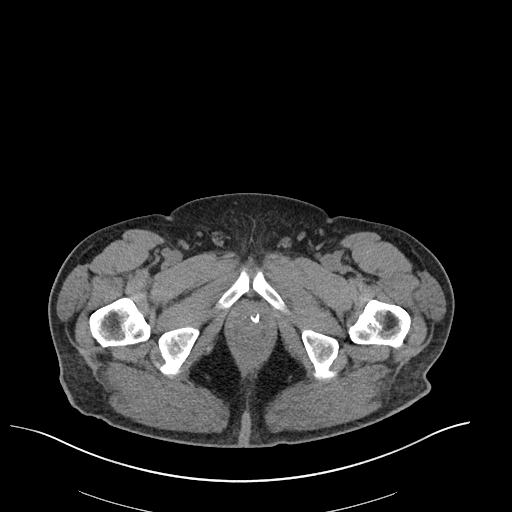
[im 21/92  soft-tissue]
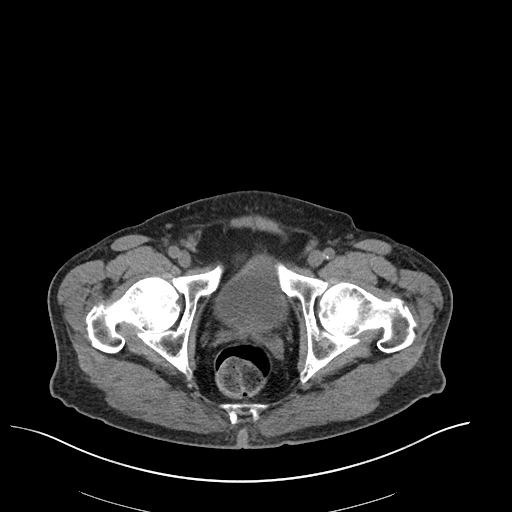
[im 25/92  soft-tissue]
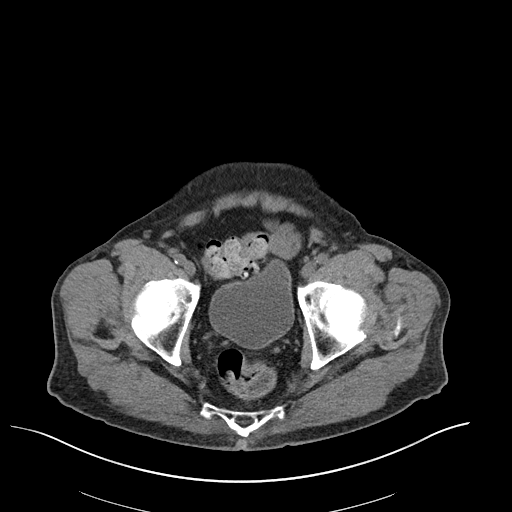
[im 34/92  soft-tissue]
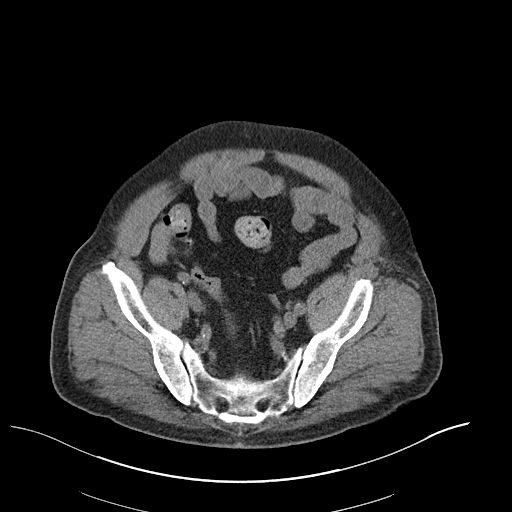
[im 38/92  soft-tissue]
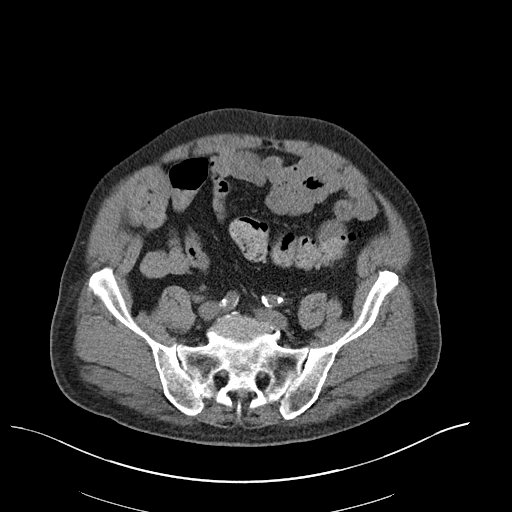
[im 46/92  soft-tissue]
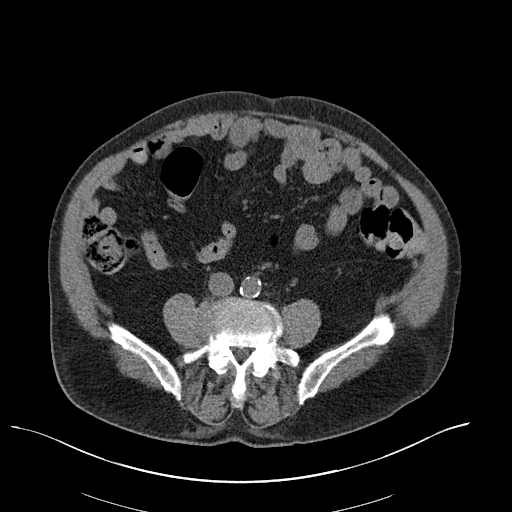
[im 54/92  soft-tissue]
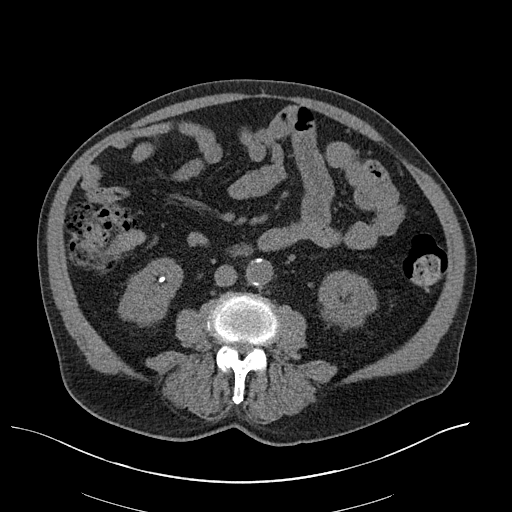
[im 58/92  soft-tissue]
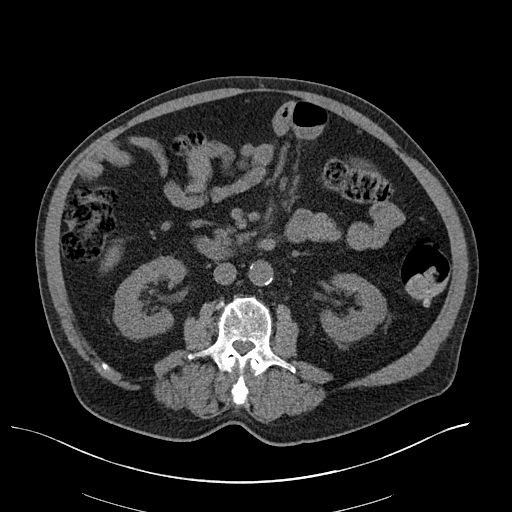
[im 58/92  bone]
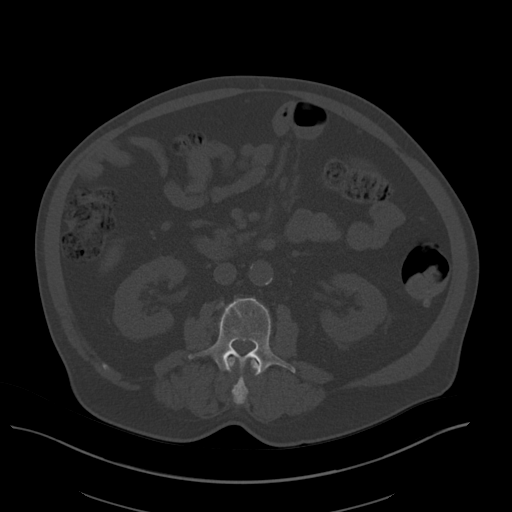
[im 67/92  soft-tissue]
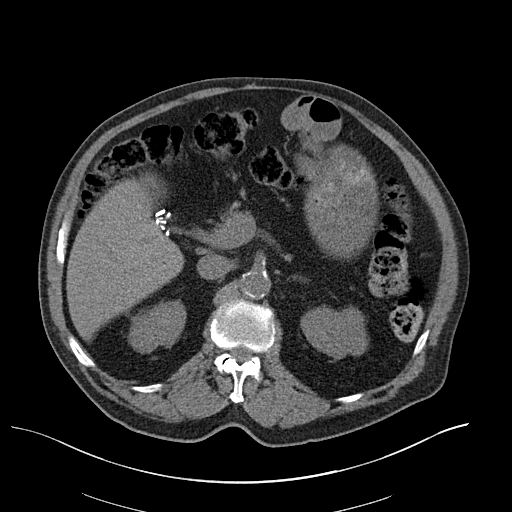
[im 71/92  soft-tissue]
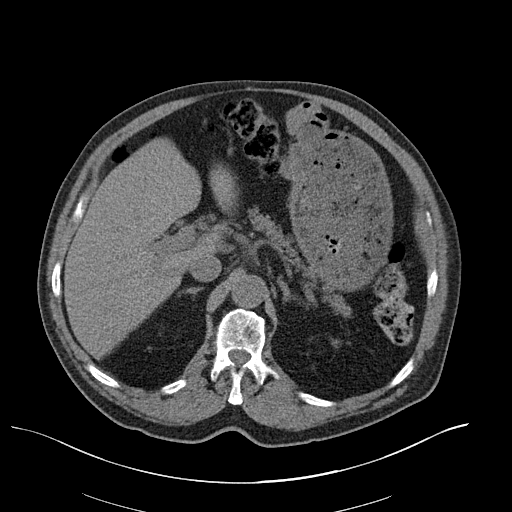
[im 79/92  soft-tissue]
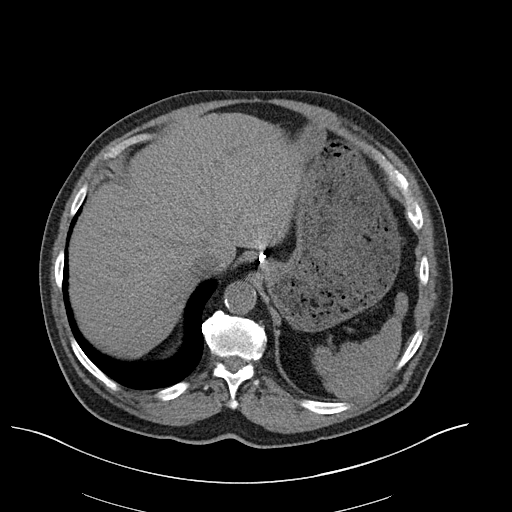
[im 87/92  soft-tissue]
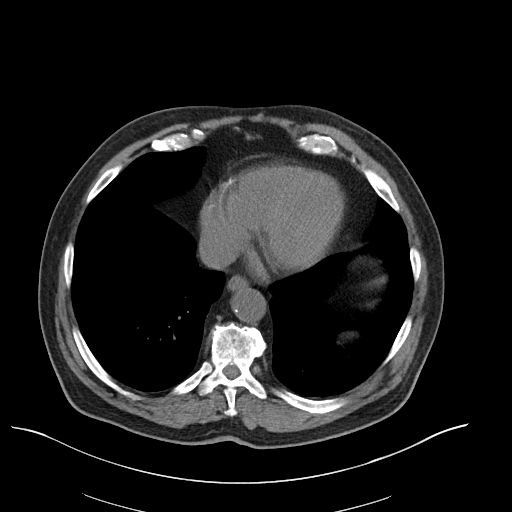

[Series 6: renal stone 3.0 cor · coronal · 0.84mm/px · 3 of 116 slices shown]
[im 39/116  soft-tissue]
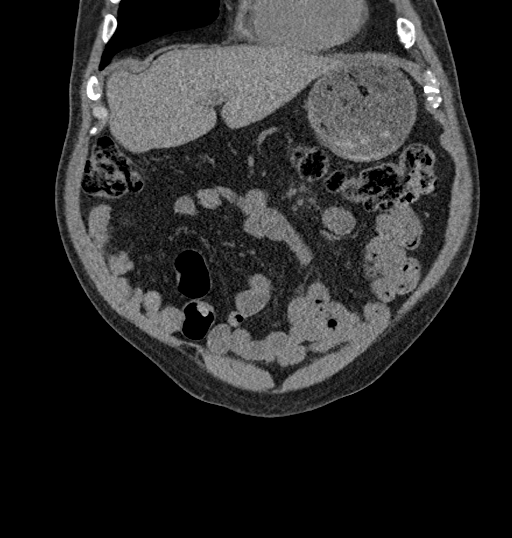
[im 52/116  soft-tissue]
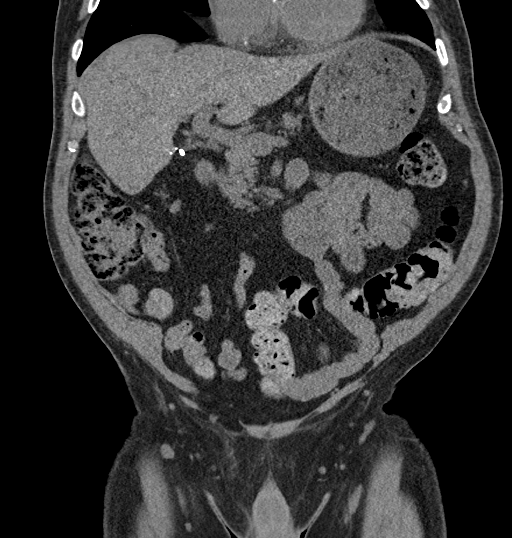
[im 64/116  soft-tissue]
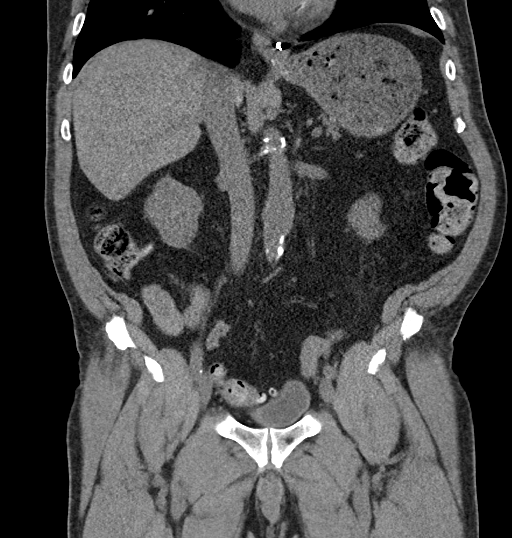

[16 of 46 positions shown; findings below may reference images not displayed]

FINDINGS: Lower chest: Visualized lung bases are clear. Coronary artery and
valvular calcifications partially visualized.

Hepatobiliary: Liver demonstrates a normal unenhanced appearance.
Gallbladder surgically absent. No biliary dilatation.

Pancreas: Pancreas within normal limits.

Spleen: Spleen within normal limits.

Adrenals/Urinary Tract: Adrenal glands are normal.

Nonobstructive calculi measuring up to 7 mm present within the lower
pole the right kidney. No other stones seen within either kidney. No
radiopaque calculi seen along the course of either ureter. No
hydroureter. Bladder within normal limits. No layering stones within
the bladder lumen.

Stomach/Bowel: Postsurgical changes present at the GE junction.
Stomach moderately distended without acute abnormality. No evidence
for bowel obstruction. Appendix normal. Mild colonic diverticulosis
without evidence for acute diverticulitis. No acute inflammatory
changes seen about the bowels.

Vascular/Lymphatic: Moderate aorto bi-iliac atherosclerotic disease.
Aorta ectatic up to 2.7 cm. No adenopathy.

Reproductive: Prostate within normal limits.

Other: No free air or fluid.

Musculoskeletal: No acute osseous abnormality. No worrisome lytic or
blastic osseous lesions. Multilevel facet arthropathy noted within
the lower lumbar spine.
IMPRESSION: 1. Nonobstructive right renal nephrolithiasis as above. No CT
evidence for obstructive uropathy. No ureterolithiasis.
2. No other acute intra-abdominal or pelvic process.
3. Colonic diverticulosis without evidence for acute diverticulitis.
4. Moderate aorto bi-iliac atherosclerotic disease.
5. **An incidental finding of potential clinical significance has
been found. Dilatation of the intraabdominal aorta up to 2.7 cm.
Ectatic abdominal aorta at risk for aneurysm development. Recommend
followup by ultrasound in 5 years. This recommendation follows ACR
consensus guidelines: White Paper of the ACR Incidental Findings
Committee II on Vascular Findings. [HOSPITAL] 4577;

## 2017-08-29 DIAGNOSIS — M9902 Segmental and somatic dysfunction of thoracic region: Secondary | ICD-10-CM | POA: Diagnosis not present

## 2017-08-29 DIAGNOSIS — M5033 Other cervical disc degeneration, cervicothoracic region: Secondary | ICD-10-CM | POA: Diagnosis not present

## 2017-08-29 DIAGNOSIS — M5384 Other specified dorsopathies, thoracic region: Secondary | ICD-10-CM | POA: Diagnosis not present

## 2017-08-29 DIAGNOSIS — M9901 Segmental and somatic dysfunction of cervical region: Secondary | ICD-10-CM | POA: Diagnosis not present

## 2017-09-03 DIAGNOSIS — R109 Unspecified abdominal pain: Secondary | ICD-10-CM | POA: Diagnosis not present

## 2017-09-03 DIAGNOSIS — Z5321 Procedure and treatment not carried out due to patient leaving prior to being seen by health care provider: Secondary | ICD-10-CM | POA: Diagnosis not present

## 2017-09-04 ENCOUNTER — Encounter (HOSPITAL_COMMUNITY): Payer: Self-pay | Admitting: Emergency Medicine

## 2017-09-04 ENCOUNTER — Emergency Department (HOSPITAL_COMMUNITY)
Admission: EM | Admit: 2017-09-04 | Discharge: 2017-09-04 | Disposition: A | Discharge status: Home / Self Care | Payer: Medicare Other | Attending: Emergency Medicine | Admitting: Emergency Medicine

## 2017-09-04 LAB — URINALYSIS, ROUTINE W REFLEX MICROSCOPIC
Bilirubin Urine: NEGATIVE
GLUCOSE, UA: NEGATIVE mg/dL
KETONES UR: NEGATIVE mg/dL
Nitrite: NEGATIVE
PH: 6 (ref 5.0–8.0)
PROTEIN: NEGATIVE mg/dL
Specific Gravity, Urine: 1.008 (ref 1.005–1.030)

## 2017-09-04 LAB — CBC
HCT: 38.5 % — ABNORMAL LOW (ref 39.0–52.0)
HEMOGLOBIN: 12.7 g/dL — AB (ref 13.0–17.0)
MCH: 30.4 pg (ref 26.0–34.0)
MCHC: 33 g/dL (ref 30.0–36.0)
MCV: 92.1 fL (ref 78.0–100.0)
PLATELETS: 230 10*3/uL (ref 150–400)
RBC: 4.18 MIL/uL — ABNORMAL LOW (ref 4.22–5.81)
RDW: 15.7 % — ABNORMAL HIGH (ref 11.5–15.5)
WBC: 9.4 10*3/uL (ref 4.0–10.5)

## 2017-09-04 LAB — COMPREHENSIVE METABOLIC PANEL
ALK PHOS: 73 U/L (ref 38–126)
ALT: 32 U/L (ref 17–63)
ANION GAP: 11 (ref 5–15)
AST: 25 U/L (ref 15–41)
Albumin: 3.6 g/dL (ref 3.5–5.0)
BUN: 13 mg/dL (ref 6–20)
CALCIUM: 9.4 mg/dL (ref 8.9–10.3)
CHLORIDE: 106 mmol/L (ref 101–111)
CO2: 23 mmol/L (ref 22–32)
Creatinine, Ser: 0.82 mg/dL (ref 0.61–1.24)
Glucose, Bld: 128 mg/dL — ABNORMAL HIGH (ref 65–99)
Potassium: 3.9 mmol/L (ref 3.5–5.1)
SODIUM: 140 mmol/L (ref 135–145)
Total Bilirubin: 0.4 mg/dL (ref 0.3–1.2)
Total Protein: 6.6 g/dL (ref 6.5–8.1)

## 2017-09-04 LAB — LIPASE, BLOOD: LIPASE: 56 U/L — AB (ref 11–51)

## 2017-09-04 NOTE — ED Triage Notes (Signed)
Pt reports R flank pain with radiation into RLQ X several weeks. Pt states he has been seen here for same and "they cant find anything wrong with him"

## 2017-09-04 NOTE — ED Notes (Signed)
Pt stated he can no longer wait and has called cab

## 2017-09-13 ENCOUNTER — Emergency Department (HOSPITAL_COMMUNITY)
Admission: EM | Admit: 2017-09-13 | Discharge: 2017-09-14 | Disposition: A | Discharge status: Home / Self Care | Payer: Medicare Other | Attending: Emergency Medicine | Admitting: Emergency Medicine

## 2017-09-13 ENCOUNTER — Other Ambulatory Visit: Payer: Self-pay

## 2017-09-13 DIAGNOSIS — E119 Type 2 diabetes mellitus without complications: Secondary | ICD-10-CM | POA: Insufficient documentation

## 2017-09-13 DIAGNOSIS — Z794 Long term (current) use of insulin: Secondary | ICD-10-CM | POA: Insufficient documentation

## 2017-09-13 DIAGNOSIS — Y929 Unspecified place or not applicable: Secondary | ICD-10-CM | POA: Insufficient documentation

## 2017-09-13 DIAGNOSIS — J449 Chronic obstructive pulmonary disease, unspecified: Secondary | ICD-10-CM | POA: Insufficient documentation

## 2017-09-13 DIAGNOSIS — Z7982 Long term (current) use of aspirin: Secondary | ICD-10-CM | POA: Insufficient documentation

## 2017-09-13 DIAGNOSIS — X58XXXA Exposure to other specified factors, initial encounter: Secondary | ICD-10-CM | POA: Diagnosis not present

## 2017-09-13 DIAGNOSIS — T1592XA Foreign body on external eye, part unspecified, left eye, initial encounter: Secondary | ICD-10-CM | POA: Diagnosis not present

## 2017-09-13 DIAGNOSIS — Y999 Unspecified external cause status: Secondary | ICD-10-CM | POA: Insufficient documentation

## 2017-09-13 DIAGNOSIS — I110 Hypertensive heart disease with heart failure: Secondary | ICD-10-CM | POA: Diagnosis not present

## 2017-09-13 DIAGNOSIS — S0592XA Unspecified injury of left eye and orbit, initial encounter: Secondary | ICD-10-CM | POA: Diagnosis present

## 2017-09-13 DIAGNOSIS — I2510 Atherosclerotic heart disease of native coronary artery without angina pectoris: Secondary | ICD-10-CM | POA: Insufficient documentation

## 2017-09-13 DIAGNOSIS — I5032 Chronic diastolic (congestive) heart failure: Secondary | ICD-10-CM | POA: Diagnosis not present

## 2017-09-13 DIAGNOSIS — F17210 Nicotine dependence, cigarettes, uncomplicated: Secondary | ICD-10-CM | POA: Insufficient documentation

## 2017-09-13 DIAGNOSIS — Z79899 Other long term (current) drug therapy: Secondary | ICD-10-CM | POA: Diagnosis not present

## 2017-09-13 DIAGNOSIS — H5462 Unqualified visual loss, left eye, normal vision right eye: Secondary | ICD-10-CM | POA: Diagnosis not present

## 2017-09-13 DIAGNOSIS — T1502XA Foreign body in cornea, left eye, initial encounter: Secondary | ICD-10-CM | POA: Diagnosis not present

## 2017-09-13 DIAGNOSIS — Y939 Activity, unspecified: Secondary | ICD-10-CM | POA: Insufficient documentation

## 2017-09-13 MED ORDER — FLUORESCEIN SODIUM 1 MG OP STRP
1.0000 | ORAL_STRIP | Freq: Once | OPHTHALMIC | Status: AC
  Administered 2017-09-14: 1 via OPHTHALMIC
  Filled 2017-09-13: qty 1

## 2017-09-13 MED ORDER — TETRACAINE HCL 0.5 % OP SOLN
2.0000 [drp] | Freq: Once | OPHTHALMIC | Status: AC
  Administered 2017-09-14: 2 [drp] via OPHTHALMIC
  Filled 2017-09-13: qty 4

## 2017-09-13 NOTE — ED Triage Notes (Addendum)
Patient c/o left eye pain  that began Monday. States that it also burns inside the eyelid. Denies injury.

## 2017-09-13 NOTE — ED Notes (Signed)
Pt states vision from his left eye has been impaired his entire life and he has been declared legally blind in this left eye.

## 2017-09-13 NOTE — ED Provider Notes (Signed)
[facility] EMERGENCY DEPARTMENT Provider Note   CSN: [id number] Arrival date & time: 09/13/17  2049     History   Chief Complaint Chief Complaint  Patient presents with  . Eye Pain    HPI [name] is a 77 y.o. male.  HPI   Patient is a 77yo male with history of CAD, COPD, type 2 diabetes, hypertension, hyperlipidemia, diastolic CHF (EF 55 to 48%), arthritis who presents to the emergency department for evaluation of left eye pain.  Patient reports that he woke up three days ago with pain in the corner of his left eye.  Reports the pain is been constant and is about an 8/10 in severity.  Pain feels "burning" and feels like something is stuck in it.  He also reports that the eye has been watering.  States "there has to be something in there."  He denies fevers, chills, red eye, visual disturbance, photophobia, painful EOM, rash.  Is legally blind in the left eye for years.  Denies recent trauma to the eye and cannot remember getting anything stuck in the eye.  He wears reading glasses, denies contact use.  Past Medical History:  Diagnosis Date  . Anemia    takes Ferrous Sulfate daily  . Arthritis    "all over"  . Balance problem 01/2014  . CAP (community acquired pneumonia) 09/18/2014  . Cervical radiculopathy due to degenerative joint disease of spine   . COPD (chronic obstructive pulmonary disease) ([place])   . Coronary artery disease, non-occlusive    a. 03/2010 Nonocclusive disease by cath, performed for ST elevations on ECG;  b. 06/2013 Lexi MV: EF 60%, no ischemia.  . Diabetes mellitus type II    takes Metformin and Lantus daily  . Diastolic CHF, chronic ([place])    a. 12/2012 EF 55-60%, diast dysfxn, triv MR, mildly dil LA/RA.  [name] History of blood transfusion 1982   "when I had stomach OR"  . History of bronchitis    1998  . History of gastric ulcer   . HTN (hypertension)    takes Diltiazem daily  . Hyperlipidemia    takes Pravastatin daily  . Joint  pain   . PAF (paroxysmal atrial fibrillation) (HCC)    Recurrent after atrial flutter (a. 07/2010 Status post caval tricuspid isthmus ablation by Dr. Midge Aver Metoprolol daily), currently controlled on flecainide plus diltiazem and Coumadin  . Weakness    numbness and tingling both hands    Patient Active Problem List   Diagnosis Date Noted  . HTN (hypertension) 05/21/2017  . Diabetes mellitus type II 05/21/2017  . Diastolic CHF, chronic ([place]) 05/21/2017  . COPD (chronic obstructive pulmonary disease) ([place]) 05/21/2017  . H/O atrial flutter 05/21/2017  . Coronary disease/nonobstructive 05/21/2017  . Acute blood loss anemia 10/02/2016  . Abdominal pain 10/02/2016  . Constipation 10/02/2016  . Left lower quadrant pain   . Coagulopathy ([place])   . Heme positive stool   . Symptomatic anemia 07/10/2016  . Syncope 05/15/2015  . Cervical disc disease 05/15/2015  . Abnormal urinalysis 05/15/2015  . Anemia 05/15/2015  . History of CVA (cerebrovascular accident) 04/03/2014  . Near syncope 02/15/2014  . Pre-syncope 02/15/2014  . Benign neoplasm of rectum and anal canal 12/02/2013  . Upper GI bleeding 11/30/2013  . Tobacco use disorder 11/30/2013  . Anticoagulation goal of INR 2 to 3, for PAF - CHA2DS2Vasc = 7 08/15/2013  . Type 2 diabetes mellitus ([place]) 01/22/2013  . Tobacco abuse counseling 12/15/2012  .  Hyperlipidemia   . Obesity (BMI 30-39.9) 12/14/2012  . Atrial flutter - status post CTI ablation 07/29/2010  . Essential hypertension 07/29/2010  . Chronic diastolic congestive heart failure ([place]) 07/29/2010  . Coronary artery disease, non-occlusive 07/29/2010    Past Surgical History:  Procedure Laterality Date  . ATRIAL ABLATION SURGERY  08/05/10   CTI ablation for atrial flutter by [facility]  . CARDIAC CATHETERIZATION  2012   nl LV function, no occlusive CAD, PAF  . CARDIOVERSION  12/07/2010    Successful direct current cardioversion with atrial fibrillation to normal sinus rhythm    . CARPAL TUNNEL RELEASE Bilateral 01/30/2014   Procedure: BILATERAL CARPAL TUNNEL RELEASE;  Surgeon: Marianna Payment, MD;  Location: [facility];  Service: Orthopedics;  Laterality: Bilateral;  . CATARACT EXTRACTION W/ INTRAOCULAR LENS  IMPLANT, BILATERAL Bilateral   . COLONOSCOPY [name] 12/02/2013   Procedure: COLONOSCOPY;  Surgeon: Irene Shipper, MD;  Location: [facility];  Service: Endoscopy;  Laterality: [name];  . ESOPHAGOGASTRODUODENOSCOPY [name] 09/22/2014   Procedure: ESOPHAGOGASTRODUODENOSCOPY (EGD);  Surgeon: Ronald Lobo, MD;  Location[facility] ENDOSCOPY;  Service: Endoscopy;  Laterality: [name];  . ESOPHAGOGASTRODUODENOSCOPY [name] 07/11/2016   Procedure: ESOPHAGOGASTRODUODENOSCOPY (EGD);  Surgeon: Doran Stabler, MD;  Location: [facility] ENDOSCOPY;  Service: Endoscopy;  Laterality: [name];  . INCISION AND DRAINAGE ABSCESS / HEMATOMA OF BURSA / KNEE / THIGH Left 1998   knee  . KNEE BURSECTOMY Left 1998  . LAPAROSCOPIC CHOLECYSTECTOMY  03/2010  . NM MYOVIEW LTD  07/22/2013   Normal EF ~60%, no ischemia or infarction.  [name] PARTIAL GASTRECTOMY  1982   subtotal; "took out 30% for ulcers"  . TRANSTHORACIC ECHOCARDIOGRAM  02/16/2014   EF 60%, no RWMA. - otherwise normal  . YAG LASER APPLICATION Bilateral         Home Medications    Prior to Admission medications   Medication Sig Start Date End Date Taking? Authorizing Provider  aspirin EC 81 MG tablet Take 81 mg by mouth daily.    [provider]  cephALEXin (KEFLEX) 500 MG capsule Take 1 capsule (500 mg total) by mouth 2 (two) times daily. 07/27/17   Ripley Fraise, MD  co-enzyme Q-10 30 MG capsule Take 30 mg by mouth daily.     [provider]  diltiazem (CARDIZEM CD) 120 MG 24 hr capsule Take 1 capsule (120 mg total) by mouth daily. 05/04/17   Leonie Man, MD  flecainide (TAMBOCOR) 150 MG tablet Take 1.5 tablets (225 mg total) by mouth 2 (two) times daily. 05/04/17   Leonie Man, MD  HYDROcodone-acetaminophen (NORCO/VICODIN) 5-325 MG  tablet Take 1 tablet by mouth every 8 (eight) hours as needed for moderate pain.    [provider]  hydrocortisone cream 1 % Apply 1 application topically 2 (two) times daily.  01/02/17   [provider]  Insulin Glargine (LANTUS) 100 UNIT/ML Solostar Pen Inject 8 Units into the skin 2 (two) times daily. 11/30/16   Domenic Polite, MD  loratadine (CLARITIN) 10 MG tablet Take 10 mg by mouth every evening.     [provider]  metFORMIN (GLUCOPHAGE) 1000 MG tablet Take 1,000 mg by mouth 2 (two) times daily with a meal.     [provider]  metoprolol succinate (TOPROL-XL) 25 MG 24 hr tablet TAKE 1/2 TABLET  (12.5 MG TOTAL) BY MOUTH DAILY. 05/04/17   Leonie Man, MD  Multiple Vitamin (MULTIVITAMIN WITH MINERALS) TABS tablet Take 1 tablet by mouth 2 (two) times daily.  [provider]  nitroGLYCERIN (NITROSTAT) 0.4 MG SL tablet Place 1 tablet (0.4 mg total) under the tongue every 5 (five) minutes x 3 doses as needed for chest pain. 11/19/15   Erlene Quan, PA-C  oxybutynin (DITROPAN-XL) 5 MG 24 hr tablet Take 5 mg by mouth daily. 11/02/16 11/02/17  [provider]  pantoprazole (PROTONIX) 40 MG tablet Take 1 tablet (40 mg total) by mouth 2 (two) times daily before a meal. 11/30/16   Domenic Polite, MD  polyethylene glycol [facility] / Floria Raveling) packet Take 17 g by mouth 2 (two) times daily. Patient taking differently: Take 17 g by mouth daily as needed for moderate constipation.  10/03/16   Raiford Noble Latif, DO  pravastatin (PRAVACHOL) 40 MG tablet Take 1 tablet (40 mg total) by mouth every evening. 05/04/17   Leonie Man, MD  tamsulosin (FLOMAX) 0.4 MG CAPS capsule Take 0.4 mg by mouth daily. 02/20/17   [provider]    Family History Family History  Problem Relation Age of Onset  . Cancer Mother   . Heart attack Father   . Heart attack Brother     Social History Social History   Tobacco Use  . Smoking status: Current Every  Day Smoker    Packs/day: 0.50    Years: 55.00    Pack years: 27.50    Types: Cigarettes  . Smokeless tobacco: Never Used  . Tobacco comment: He's been smoking between 0.5 and 2 ppd since age 59.  Substance Use Topics  . Alcohol use: No    Alcohol/week: 0.0 oz    Comment: "quit drinking in 1986"  . Drug use: No     Allergies   Cyclobenzaprine   Review of Systems Review of Systems  Constitutional: Negative for chills and fever.  HENT: Negative for facial swelling.   Eyes: Positive for pain and discharge (watering). Negative for photophobia, redness and visual disturbance.  Skin: Negative for rash.     Physical Exam Updated Vital Signs BP 115/68   Pulse 92   Temp 98.2 F (36.8 C) (Oral)   Resp 20   Ht 5\' 6"  (1.676 m)   Wt 78.5 kg (173 lb)   SpO2 95%   BMI 27.92 kg/m   Physical Exam  Constitutional: He appears well-developed and well-nourished. No distress.  Nontoxic.  HENT:  Head: Normocephalic and atraumatic.  Eyes: Pupils are equal, round, and reactive to light. Conjunctivae are normal. Right eye exhibits no discharge. Left eye exhibits no discharge.  Appearance. Left eye with mild scleral erythema. PERRL intact. EOMI without nystagmus. No consensual photophobia.  Corneal Abrasion Exam VCO. Risks, benefits and alternatives explained. 2 drops of tetracaine (PONTOCAINE) 0.5 % ophthalmic solution were applied to the left eye. Fluorescein 1 MG ophthalmic strip applied the the surface of the left eye Woods lamp used to screen for abrasion. Small brown particle noted near the lateral canthus. No increased fluorescein uptake. No corneal ulcer. No hyphema. No dendritic lesion. No visible hyphema.  Eye flushed with 27mL sterile saline with successful flushing out of the observed particle Patient tolerated the procedure well TONOPEN: 16LE  Pulmonary/Chest: Effort normal. No respiratory distress.  Neurological: He is alert. Coordination normal.  Skin: He is not  diaphoretic.  Psychiatric: He has a normal mood and affect. His behavior is normal.  Nursing note and vitals reviewed.    ED Treatments / Results  Labs (all labs ordered are listed, but only abnormal results are displayed) Labs Reviewed - No  data to display  EKG None  Radiology No results found.  Procedures Procedures (including critical care time)  Medications Ordered in ED Medications  fluorescein ophthalmic strip 1 strip (1 strip Left Eye Given by Other 09/14/17 0014)  tetracaine (PONTOCAINE) 0.5 % ophthalmic solution 2 drop (2 drops Left Eye Given by Other 09/14/17 0015)     Initial Impression / Assessment and Plan / ED Course  I have reviewed the triage vital signs and the nursing notes.  Pertinent labs & imaging results that were available during my care of the patient were reviewed by me and considered in my medical decision making (see chart for details).     Small particle flushed out of patient's left eye. No corneal abrasion or corneal ulcer. He is legally blind in the left eye and denies change in vision from baseline. His VSS. Plan to discharge home with instructions to follow up with his ophthalmologist for any new or concerning symptoms regarding his eye.  Final Clinical Impressions(s) / ED Diagnoses   Final diagnoses:  Foreign body of left eye, initial encounter    ED Discharge Orders    None       Bernarda Caffey 09/14/17 Loura Pardon    Duffy Bruce, MD 09/14/17 1649

## 2017-09-14 NOTE — Discharge Instructions (Signed)
Please follow-up with your eye doctor if you have any new or concerning symptoms like trouble with your vision, new eye pain or redness.

## 2017-09-20 IMAGING — CR DG CHEST 2V
2 series · 3 of 3 positions shown · non-contrast
Comparison: 07/10/2016 at [DATE] a.m. and CT scan of the chest dated
12/19/2011

CLINICAL DATA: Pulmonary nodule.

EXAM:
CHEST  2 VIEW with nipple markers

[Series 2: chest lat · 0.14mm/px · 2 of 2 slices shown]
[im 1/2]
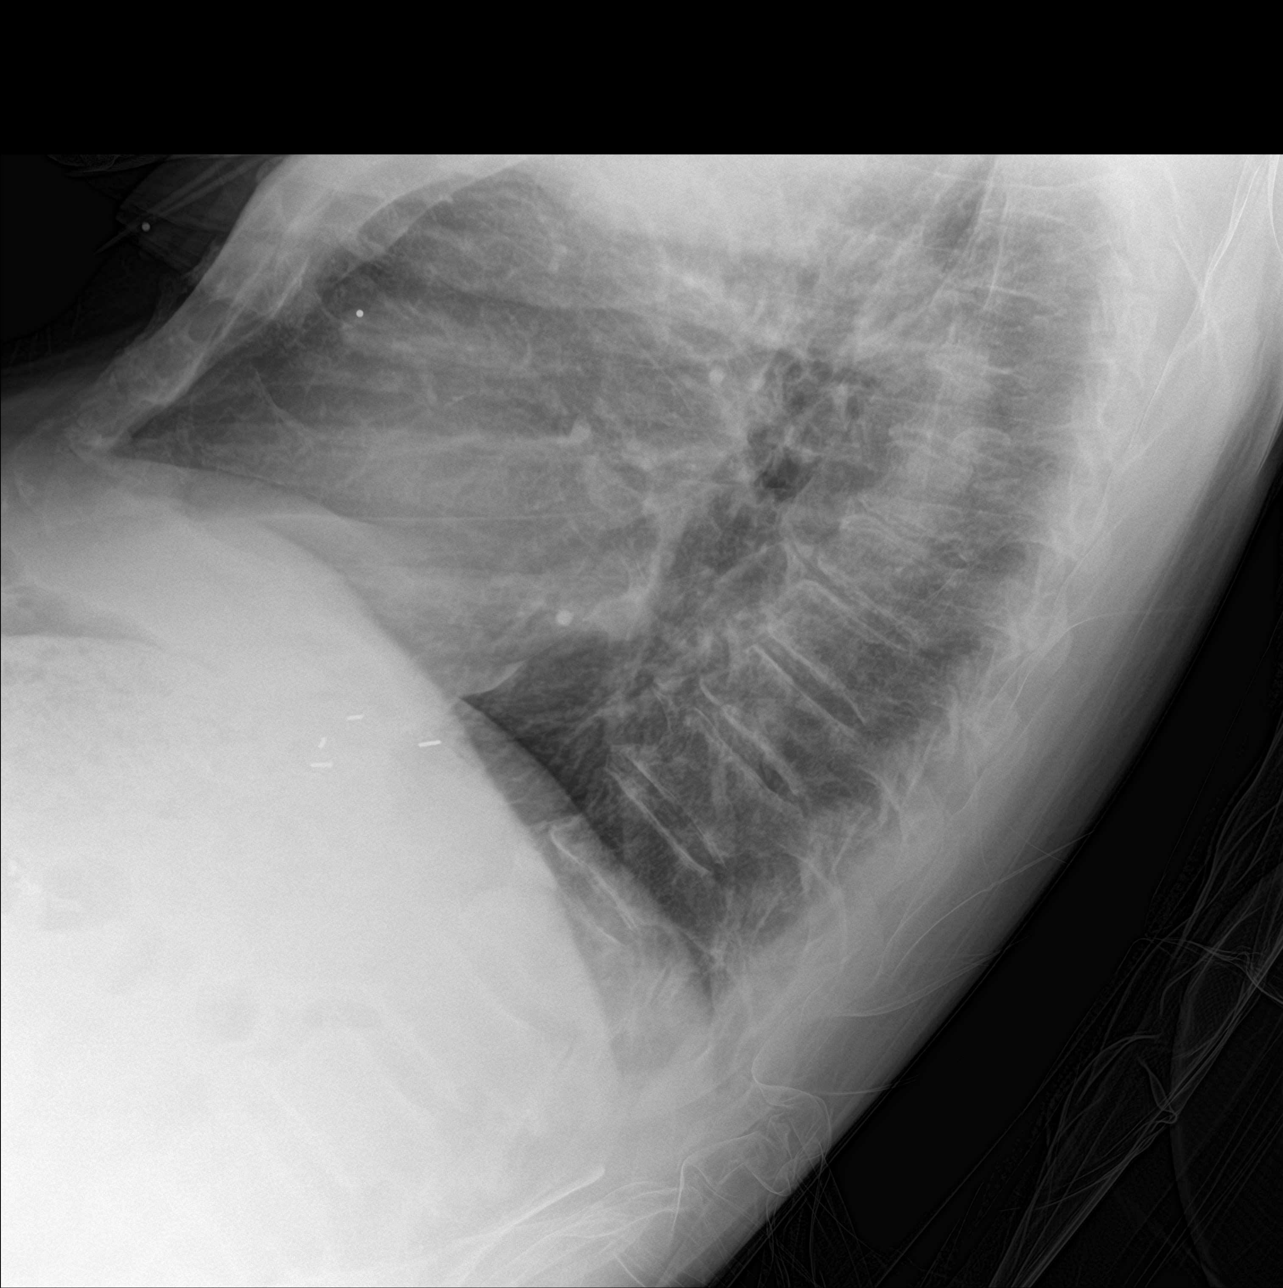
[im 2/2]
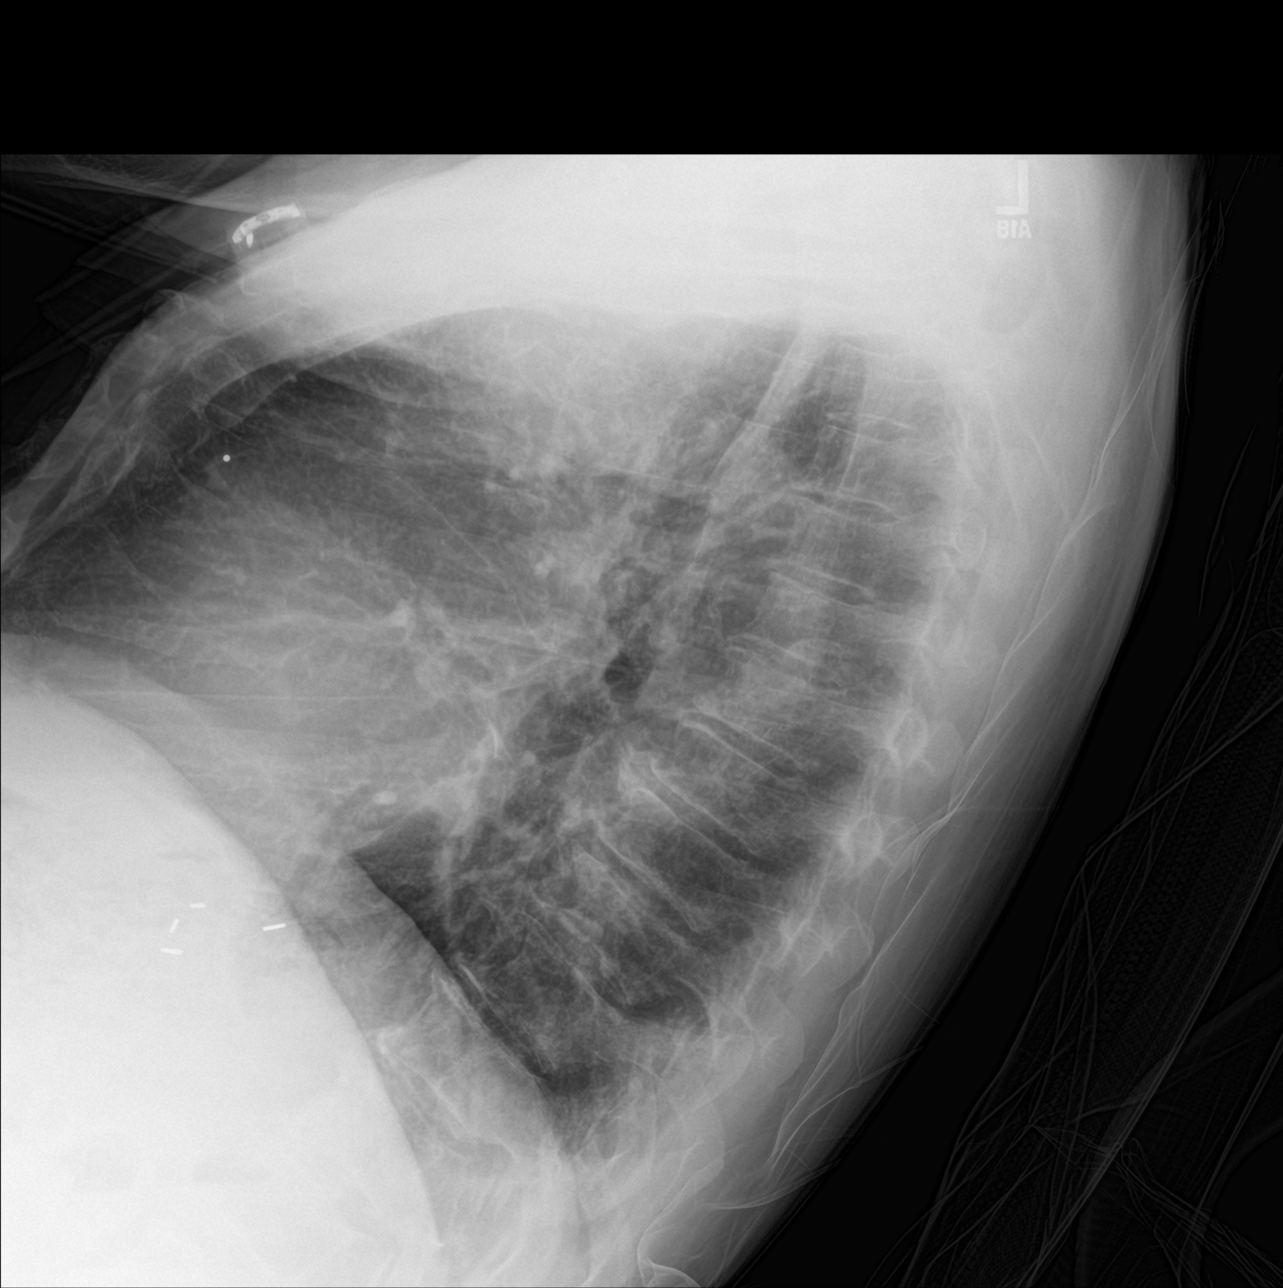

[chest ap]
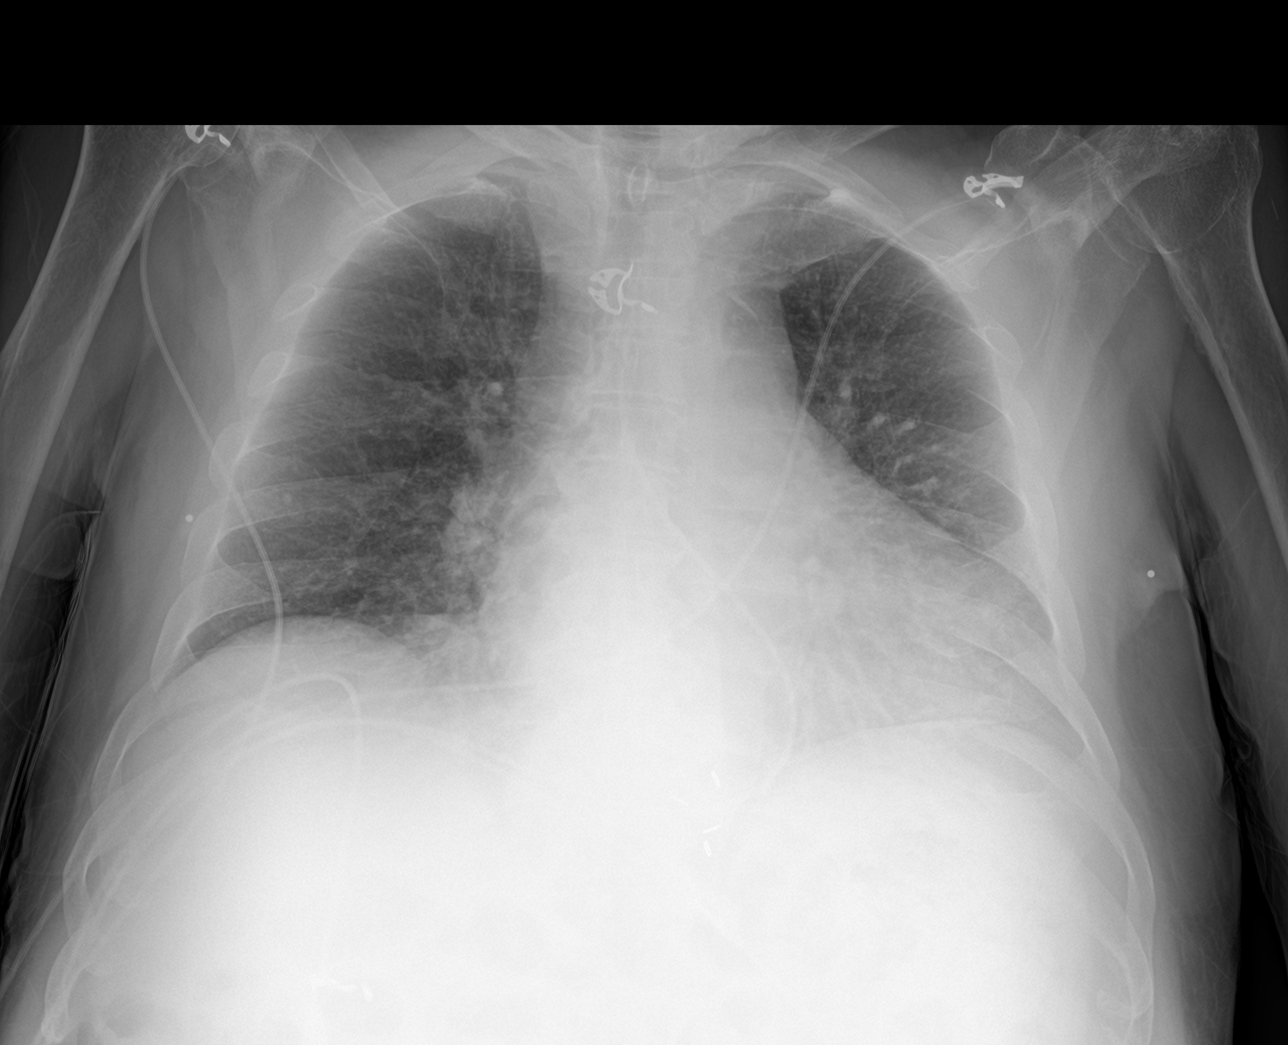

[3 of 3 positions shown; findings below may reference images not displayed]

FINDINGS: Exam demonstrates at the nodule seen on chest x-ray at the right
lung base does not represent a nipple shadow but does represent a
granuloma, which correlates with a granuloma in that area present on
prior chest CT dated 12/19/2011. This granuloma appears slightly
larger than on the prior study but this is not felt to be worrisome.
There other small nodules demonstrated in the lungs on the prior CT
scan as well.
IMPRESSION: No active cardiopulmonary disease. The nodule noted at the right
lung base on the prior exam is felt represent a granuloma.

## 2017-09-20 IMAGING — CT CT ABD-PELV W/ CM
2 of 5 series · 16 of 46 positions shown, 18 images · IV contrast (APPLIED)
Comparison: 06/02/2016

CLINICAL DATA: Fall out of bed landing on the left hip 3 weeks ago.
Persistent pain.

EXAM:
CT ABDOMEN AND PELVIS WITH CONTRAST
TECHNIQUE: Multidetector CT imaging of the abdomen and pelvis was performed
using the standard protocol following bolus administration of
intravenous contrast.
CONTRAST:  100 cc Isovue 300 intravenous

[Series 3: abd/ pelvis 5.0 i30f 2 · axial · 0.88mm/px · z∈[+667,+1042]mm · 13 of 85 slices shown, 15 images]
[im 5/85  soft-tissue]
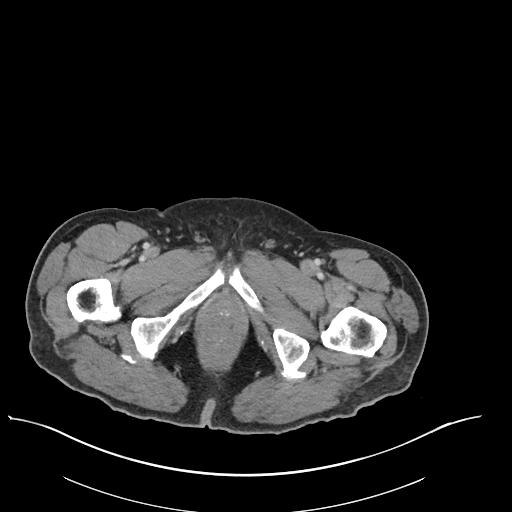
[im 5/85  bone]
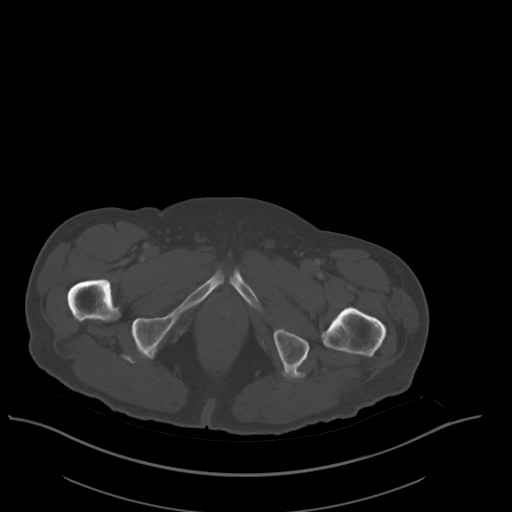
[im 13/85  soft-tissue]
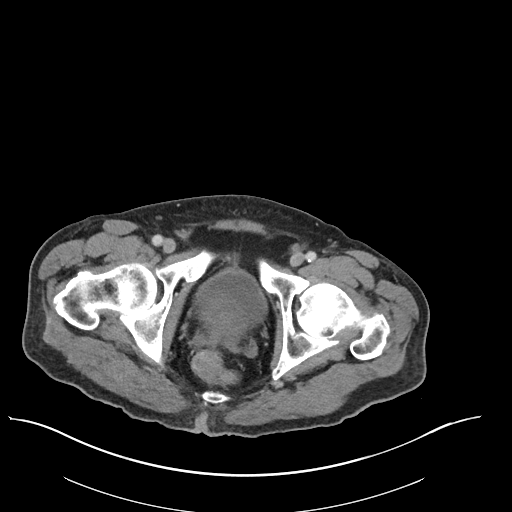
[im 17/85  soft-tissue]
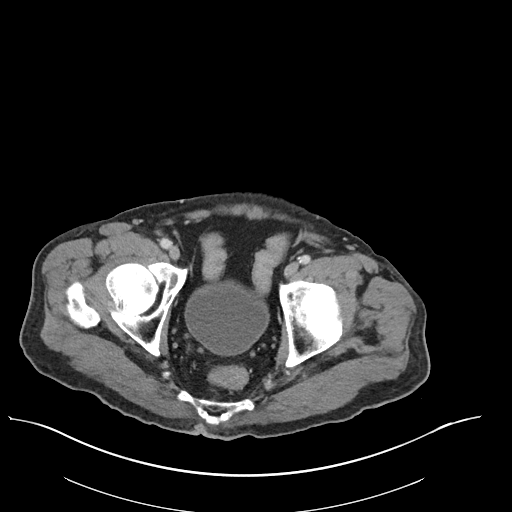
[im 26/85  soft-tissue]
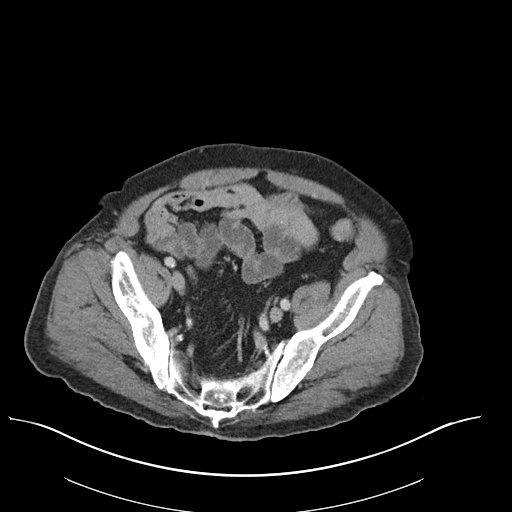
[im 30/85  soft-tissue]
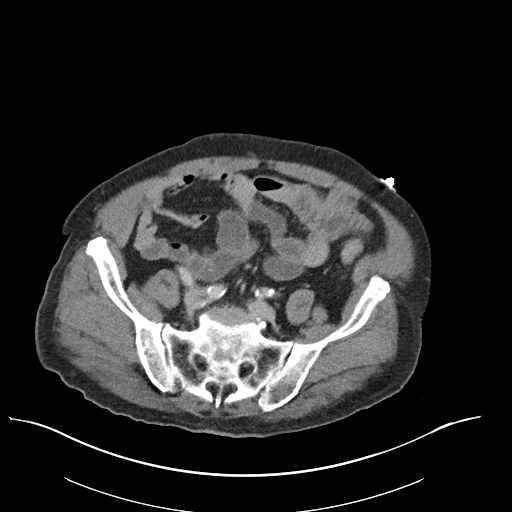
[im 38/85  soft-tissue]
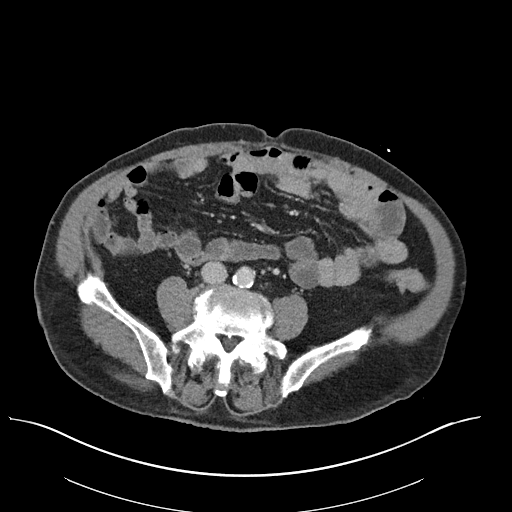
[im 43/85  soft-tissue]
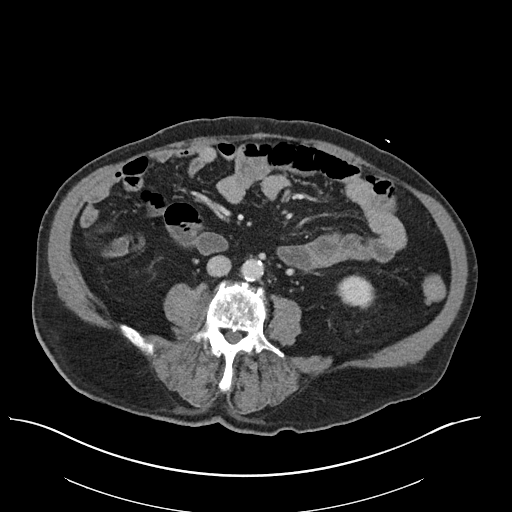
[im 47/85  soft-tissue]
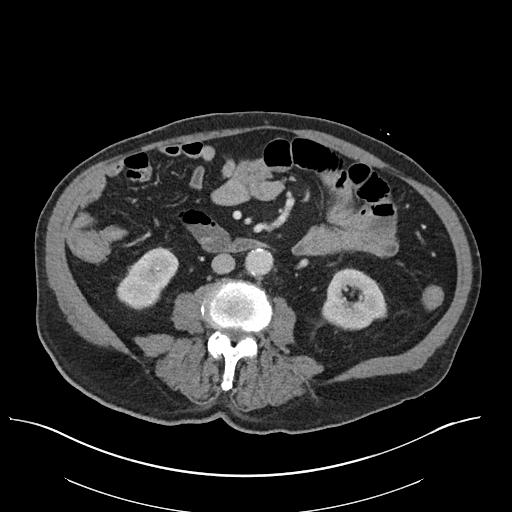
[im 55/85  soft-tissue]
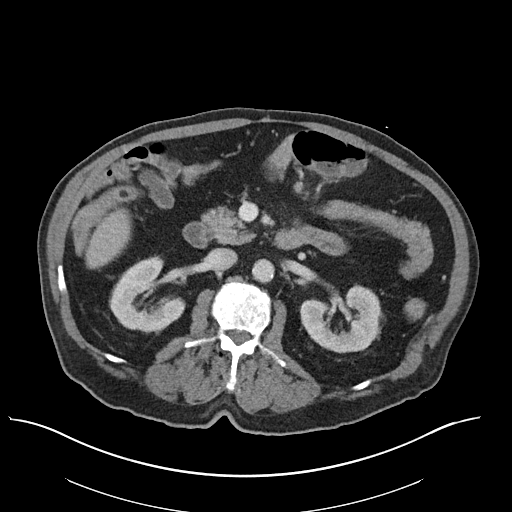
[im 55/85  bone]
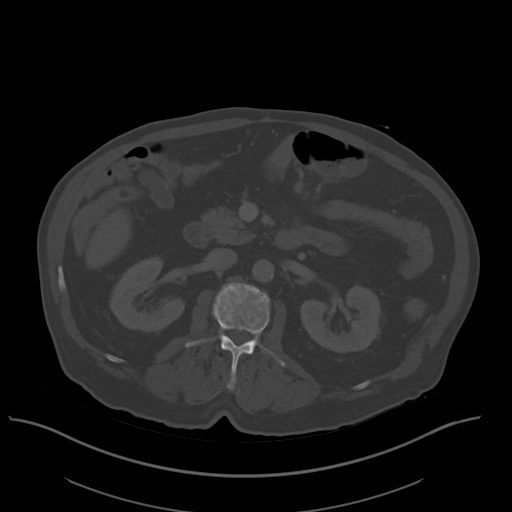
[im 59/85  soft-tissue]
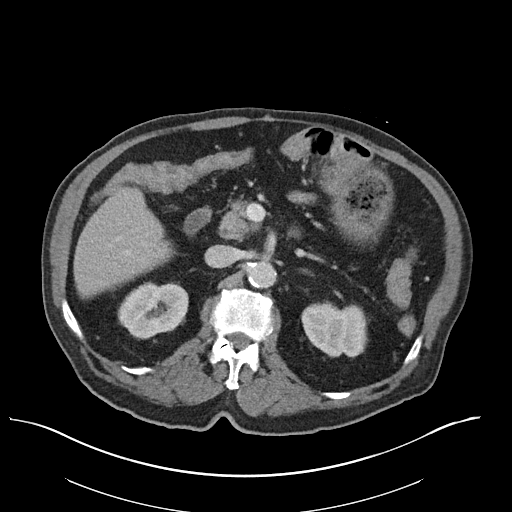
[im 68/85  soft-tissue]
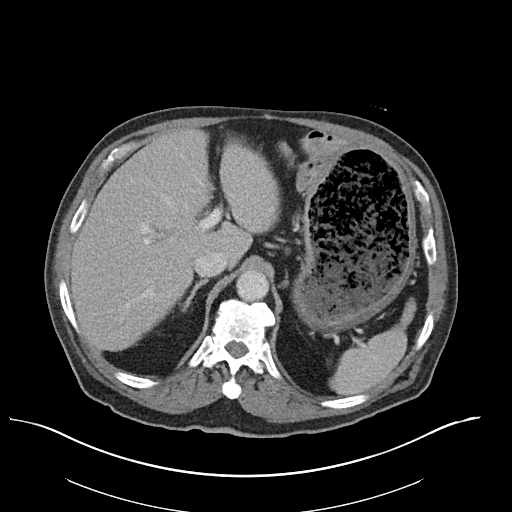
[im 72/85  soft-tissue]
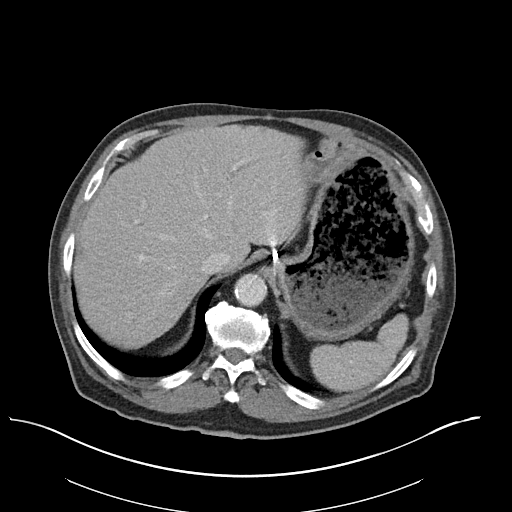
[im 80/85  soft-tissue]
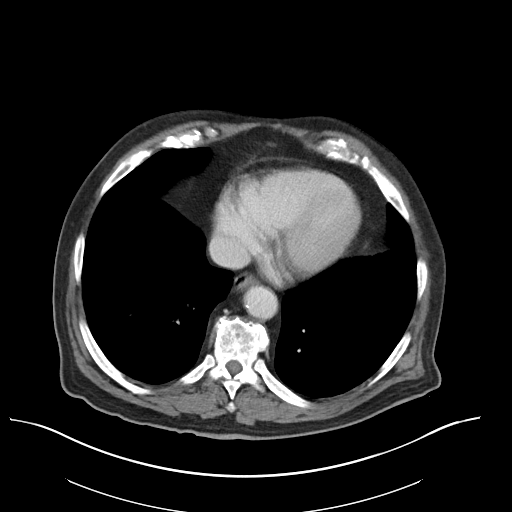

[Series 6: coronal soft tissue · coronal · 0.90mm/px · 3 of 101 slices shown]
[im 34/101  soft-tissue]
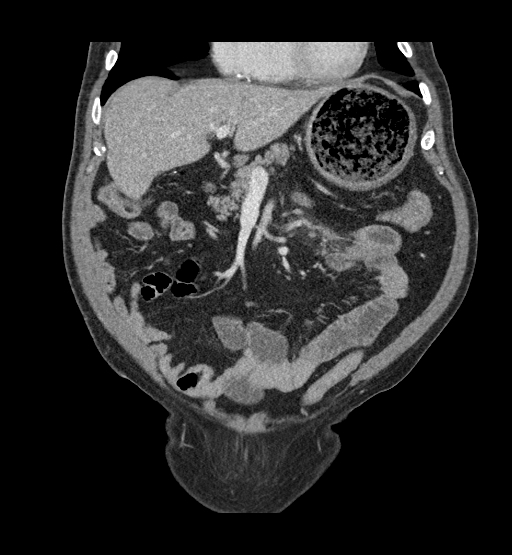
[im 45/101  soft-tissue]
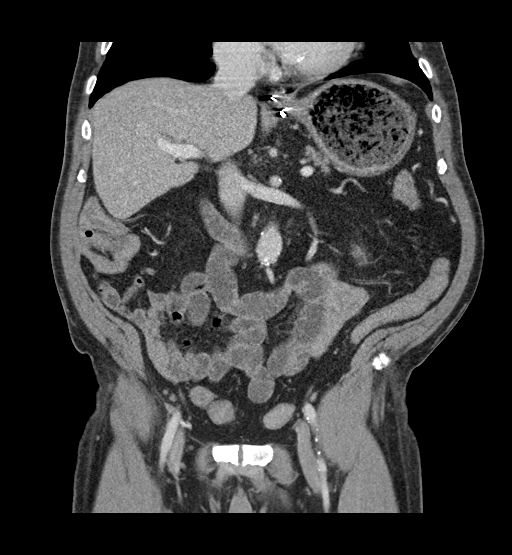
[im 56/101  soft-tissue]
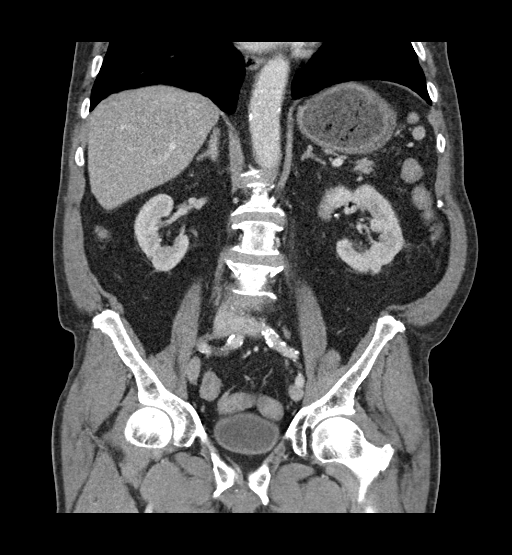

[16 of 46 positions shown; findings below may reference images not displayed]

FINDINGS: Lower chest:  Coronary atherosclerosis.  No acute finding

Hepatobiliary: No focal liver abnormality.Cholecystectomy. Normal
common bile duct diameter.

Pancreas: Unremarkable.

Spleen: Unremarkable.

Adrenals/Urinary Tract: Negative adrenals. Two nonobstructing right
renal calculi. No hydronephrosis. Symmetric renal enhancement.
Smooth symmetric thickened appearance of the bladder floor which is
stable over priors and likely from prostate.

Stomach/Bowel: Status post distal gastrectomy, reportedly for
ulcers. The stomach contains semi solid-appearing material, also
noted on the 3 most recent comparisons. The stomach wall is
borderline thickened but no fat inflammation. There is newly seen
fat edema in the small bowel mesenteries at the gastroenteric
anastomosis. Lymph nodes are also focally prominent in this region.
Mesenteric vessels are patent. No pneumatosis. No small bowel or
colonic obstruction. Clips at the GE junction, presumed anti reflux
surgery. Mild fullness along the medial wall of the mid duodenum,
stable since at least September 22, 2014 and presumably fold redundancy at
the ampulla.

Vascular/Lymphatic: No acute vascular abnormality. Diffuse
atheromatous calcifications. No mass or adenopathy.

Reproductive:Prostatic calcification and mild enlargement that is
similar to priors.

Other: No ascites or pneumoperitoneum.

Musculoskeletal: No acute abnormalities. Spondylosis and lower
lumbar facet arthropathy.
IMPRESSION: 1. Mild mesenteric inflammation near the gastroenteric anastomosis.
There is no visible small bowel thickening, but given focal
appearance this could reflect marginal enteritis.
2. The gastric remnant is moderately distended with semi-solid, also
noted on preceding comparison CTs. Correlate for chronic outlet
obstruction.
3. Right nephrolithiasis.

## 2017-09-20 IMAGING — DX DG CHEST 2V
2 series · 2 of 2 positions shown · non-contrast
Comparison: Radiographs February 23, 2016.

CLINICAL DATA: Cough, shortness of breath.

EXAM:
CHEST  2 VIEW

[chest pa]
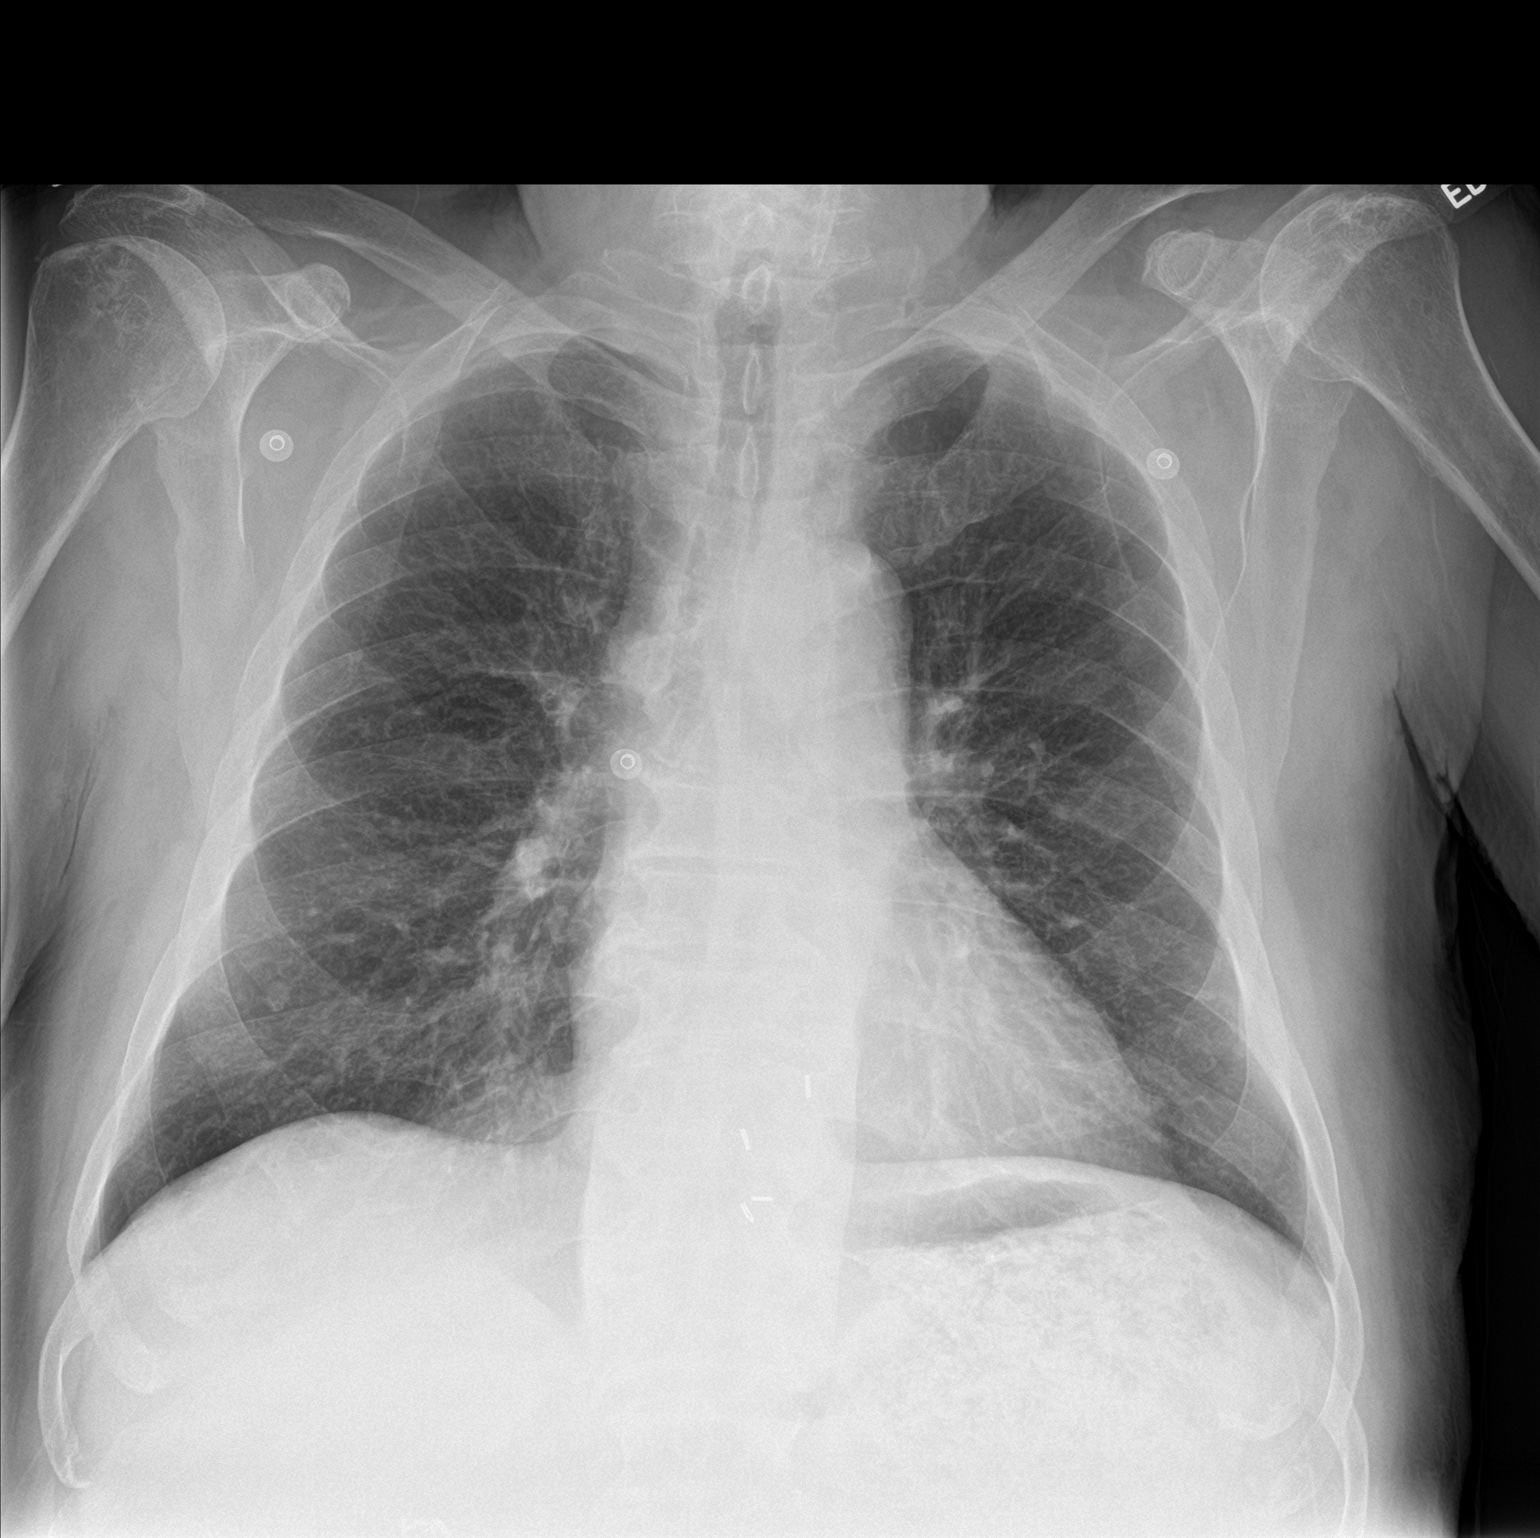

[chest lat]
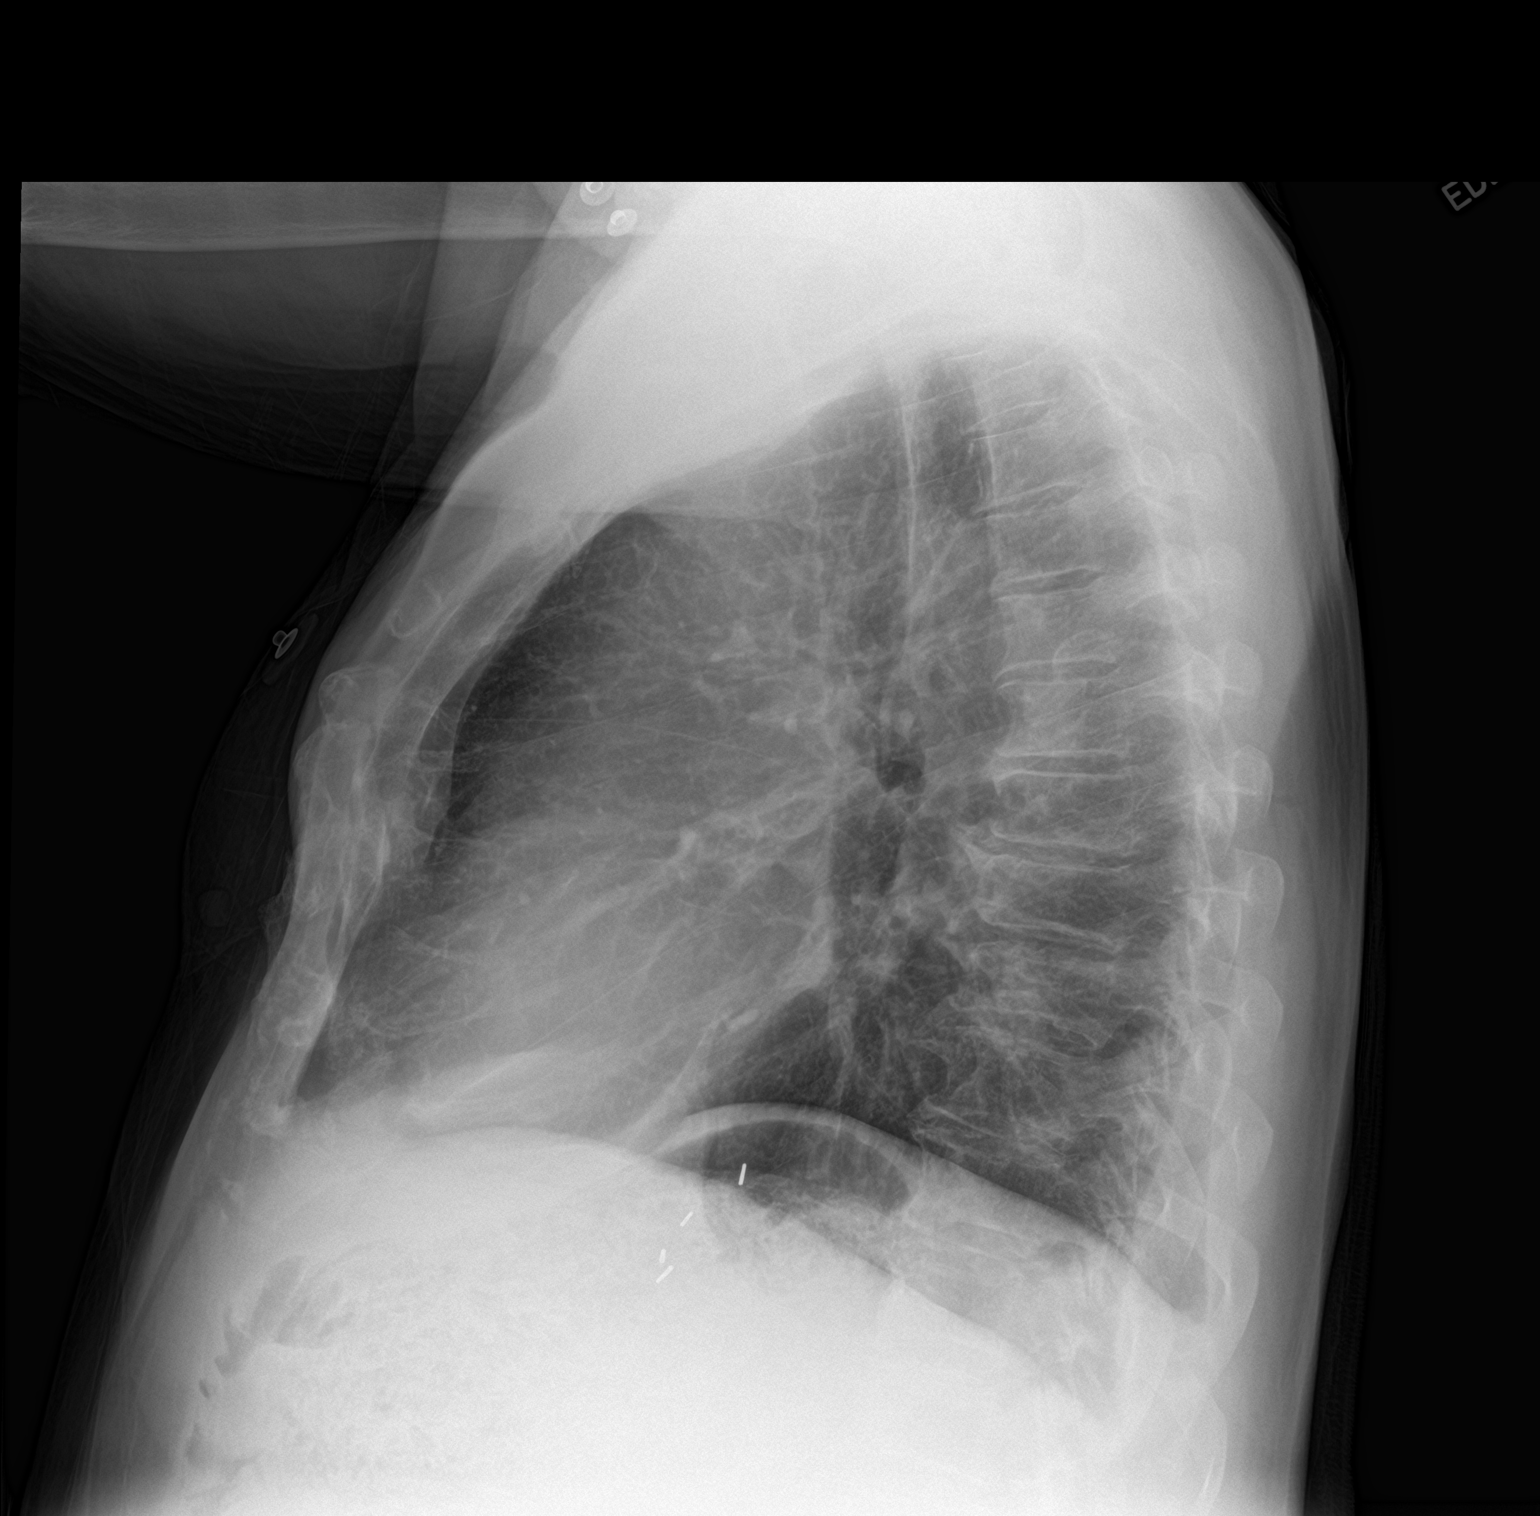

[2 of 2 positions shown; findings below may reference images not displayed]

FINDINGS: The heart size and mediastinal contours are within normal limits. No
consolidative process is noted. Rounded nodular density seen
projected over right lower lobe. No pneumothorax or pleural effusion
is noted. The visualized skeletal structures are unremarkable.
IMPRESSION: Nodular rounded density seen projected over right lower lobe which
may simply represent overlying nipple shadow, but pulmonary nodule
cannot be excluded. Repeat radiograph nipple markers is recommended.
No other abnormality seen in the chest

## 2017-09-21 DIAGNOSIS — Z794 Long term (current) use of insulin: Secondary | ICD-10-CM | POA: Diagnosis not present

## 2017-09-21 DIAGNOSIS — E119 Type 2 diabetes mellitus without complications: Secondary | ICD-10-CM | POA: Diagnosis not present

## 2017-09-21 DIAGNOSIS — M7918 Myalgia, other site: Secondary | ICD-10-CM | POA: Diagnosis not present

## 2017-09-21 DIAGNOSIS — G8929 Other chronic pain: Secondary | ICD-10-CM | POA: Diagnosis not present

## 2017-09-21 DIAGNOSIS — E782 Mixed hyperlipidemia: Secondary | ICD-10-CM | POA: Diagnosis not present

## 2017-09-21 DIAGNOSIS — I10 Essential (primary) hypertension: Secondary | ICD-10-CM | POA: Diagnosis not present

## 2017-09-26 ENCOUNTER — Emergency Department (HOSPITAL_COMMUNITY): Payer: Medicare Other

## 2017-09-26 ENCOUNTER — Other Ambulatory Visit: Payer: Self-pay

## 2017-09-26 ENCOUNTER — Emergency Department (HOSPITAL_COMMUNITY)
Admission: EM | Admit: 2017-09-26 | Discharge: 2017-09-26 | Disposition: A | Discharge status: Home / Self Care | Payer: Medicare Other | Attending: Emergency Medicine | Admitting: Emergency Medicine

## 2017-09-26 DIAGNOSIS — Z79899 Other long term (current) drug therapy: Secondary | ICD-10-CM | POA: Diagnosis not present

## 2017-09-26 DIAGNOSIS — I10 Essential (primary) hypertension: Secondary | ICD-10-CM | POA: Diagnosis not present

## 2017-09-26 DIAGNOSIS — F17210 Nicotine dependence, cigarettes, uncomplicated: Secondary | ICD-10-CM | POA: Insufficient documentation

## 2017-09-26 DIAGNOSIS — I5032 Chronic diastolic (congestive) heart failure: Secondary | ICD-10-CM | POA: Diagnosis not present

## 2017-09-26 DIAGNOSIS — I110 Hypertensive heart disease with heart failure: Secondary | ICD-10-CM | POA: Diagnosis not present

## 2017-09-26 DIAGNOSIS — J449 Chronic obstructive pulmonary disease, unspecified: Secondary | ICD-10-CM | POA: Diagnosis not present

## 2017-09-26 DIAGNOSIS — Z7982 Long term (current) use of aspirin: Secondary | ICD-10-CM | POA: Insufficient documentation

## 2017-09-26 DIAGNOSIS — Z794 Long term (current) use of insulin: Secondary | ICD-10-CM | POA: Diagnosis not present

## 2017-09-26 DIAGNOSIS — I2510 Atherosclerotic heart disease of native coronary artery without angina pectoris: Secondary | ICD-10-CM | POA: Diagnosis not present

## 2017-09-26 DIAGNOSIS — I499 Cardiac arrhythmia, unspecified: Secondary | ICD-10-CM | POA: Diagnosis not present

## 2017-09-26 DIAGNOSIS — E119 Type 2 diabetes mellitus without complications: Secondary | ICD-10-CM | POA: Diagnosis not present

## 2017-09-26 DIAGNOSIS — I4430 Unspecified atrioventricular block: Secondary | ICD-10-CM | POA: Diagnosis not present

## 2017-09-26 DIAGNOSIS — R531 Weakness: Secondary | ICD-10-CM

## 2017-09-26 LAB — COMPREHENSIVE METABOLIC PANEL
ALK PHOS: 87 U/L (ref 38–126)
ALT: 26 U/L (ref 0–44)
AST: 25 U/L (ref 15–41)
Albumin: 3.2 g/dL — ABNORMAL LOW (ref 3.5–5.0)
Anion gap: 11 (ref 5–15)
BUN: 17 mg/dL (ref 8–23)
CO2: 24 mmol/L (ref 22–32)
CREATININE: 0.91 mg/dL (ref 0.61–1.24)
Calcium: 8.8 mg/dL — ABNORMAL LOW (ref 8.9–10.3)
Chloride: 104 mmol/L (ref 98–111)
GFR calc Af Amer: >60 mL/min (ref >60)
GFR calc non Af Amer: >60 mL/min (ref >60)
Glucose, Bld: 90 mg/dL (ref 70–99)
Potassium: 4.2 mmol/L (ref 3.5–5.1)
SODIUM: 139 mmol/L (ref 135–145)
Total Bilirubin: 0.4 mg/dL (ref 0.3–1.2)
Total Protein: 6.4 g/dL — ABNORMAL LOW (ref 6.5–8.1)

## 2017-09-26 LAB — I-STAT TROPONIN, ED: TROPONIN I, POC: 0 ng/mL (ref 0.00–0.08)

## 2017-09-26 LAB — CBC WITH DIFFERENTIAL/PLATELET
BASOS PCT: 1 %
Basophils Absolute: 0 10*3/uL (ref 0.0–0.1)
EOS ABS: 0.1 10*3/uL (ref 0.0–0.7)
EOS PCT: 2 %
HCT: 37.1 % — ABNORMAL LOW (ref 39.0–52.0)
HEMOGLOBIN: 12.2 g/dL — AB (ref 13.0–17.0)
LYMPHS ABS: 0.9 10*3/uL (ref 0.7–4.0)
Lymphocytes Relative: 14 %
MCH: 30.6 pg (ref 26.0–34.0)
MCHC: 32.9 g/dL (ref 30.0–36.0)
MCV: 93 fL (ref 78.0–100.0)
Monocytes Absolute: 0.6 10*3/uL (ref 0.1–1.0)
Monocytes Relative: 9 %
Neutro Abs: 4.9 10*3/uL (ref 1.7–7.7)
Neutrophils Relative %: 74 %
PLATELETS: 259 10*3/uL (ref 150–400)
RBC: 3.99 MIL/uL — AB (ref 4.22–5.81)
RDW: 14.1 % (ref 11.5–15.5)
WBC: 6.5 10*3/uL (ref 4.0–10.5)

## 2017-09-26 LAB — URINALYSIS, ROUTINE W REFLEX MICROSCOPIC
BACTERIA UA: NONE SEEN
Bilirubin Urine: NEGATIVE
GLUCOSE, UA: NEGATIVE mg/dL
Hgb urine dipstick: NEGATIVE
Ketones, ur: NEGATIVE mg/dL
NITRITE: NEGATIVE
Protein, ur: NEGATIVE mg/dL
SPECIFIC GRAVITY, URINE: 1.018 (ref 1.005–1.030)
pH: 5 (ref 5.0–8.0)

## 2017-09-26 LAB — FERRITIN: FERRITIN: 14 ng/mL — AB (ref 24–336)

## 2017-09-26 LAB — IRON AND TIBC
Iron: 48 ug/dL (ref 45–182)
Saturation Ratios: 14 % — ABNORMAL LOW (ref 17.9–39.5)
TIBC: 347 ug/dL (ref 250–450)
UIBC: 299 ug/dL

## 2017-09-26 LAB — TYPE AND SCREEN
ABO/RH(D): A NEG
ANTIBODY SCREEN: NEGATIVE

## 2017-09-26 MED ORDER — SODIUM CHLORIDE 0.9 % IV BOLUS
500.0000 mL | Freq: Once | INTRAVENOUS | Status: AC
  Administered 2017-09-26: 500 mL via INTRAVENOUS

## 2017-09-26 NOTE — ED Notes (Signed)
Bed: SY71 Expected date:  Expected time:  Means of arrival:  Comments: 77 yo f generalized weakness

## 2017-09-26 NOTE — Discharge Instructions (Signed)
Follow-up with your primary care doctor next 2 to 4 days for further evaluation.  Go to your iron infusions as directed.  Return the emergency department for any chest pain, difficulty breathing, difficulty moving her arms or legs, speech difficulty, vision changes or any other worsening or concerning symptoms.

## 2017-09-26 NOTE — ED Provider Notes (Signed)
[facility] DEPT Provider Note   CSN: [id number] Arrival date & time: 09/26/17  1640     History   Chief Complaint Chief Complaint  Patient presents with  . Weakness  . Anemia    HPI [name] is a 77 y.o. male with past medical history of anemia, CAP, COPD, CAD, diabetes, CHF, paroxysmal A. fib who presents for evaluation of 4 days of generalized weakness.  Patient thinks that it is caused by his anemia.  He states that he always gets this way whenever he is anemic.  He states that he has iron infusions for his iron deficiency anemia.  Patient reports that his last one was a few weeks ago.  Patient reports that he called his doctor's office today told him of his complaints and they told him to come to ED for evaluation.  Patient reports over the last 4 days, he has just felt generalized weak, fatigued.  He states no focal weakness.  He has had some numb tingling sensation in bilateral feet and bilateral hands which he states is what happens when he gets anemic.  He has been able to ambulate.  Patient denies any fevers, chest pain, difficulty breathing, abdominal pain, nausea/vomiting, blood in stool, vision changes, speech difficulty.  The history is provided by the patient.    Past Medical History:  Diagnosis Date  . Anemia    takes Ferrous Sulfate daily  . Arthritis    "all over"  . Balance problem 01/2014  . CAP (community acquired pneumonia) 09/18/2014  . Cervical radiculopathy due to degenerative joint disease of spine   . COPD (chronic obstructive pulmonary disease) ([place])   . Coronary artery disease, non-occlusive    a. 03/2010 Nonocclusive disease by cath, performed for ST elevations on ECG;  b. 06/2013 Lexi MV: EF 60%, no ischemia.  . Diabetes mellitus type II    takes Metformin and Lantus daily  . Diastolic CHF, chronic ([place])    a. 12/2012 EF 55-60%, diast dysfxn, triv MR, mildly dil LA/RA.  [name] History of blood transfusion 1982   "when  I had stomach OR"  . History of bronchitis    1998  . History of gastric ulcer   . HTN (hypertension)    takes Diltiazem daily  . Hyperlipidemia    takes Pravastatin daily  . Joint pain   . PAF (paroxysmal atrial fibrillation) (H[facility])    Recurrent after atrial flutter (a. 07/2010 Status post caval tricuspid isthmus ablation by Dr. Midge Aver Metoprolol daily), currently controlled on flecainide plus diltiazem and Coumadin  . Weakness    numbness and tingling both hands    Patient Active Problem List   Diagnosis Date Noted  . HTN (hypertension) 05/21/2017  . Diabetes mellitus type II 05/21/2017  . Diastolic CHF, chronic ([place]) 05/21/2017  . COPD (chronic obstructive pulmonary disease) ([place]) 05/21/2017  . H/O atrial flutter 05/21/2017  . Coronary disease/nonobstructive 05/21/2017  . Acute blood loss anemia 10/02/2016  . Abdominal pain 10/02/2016  . Constipation 10/02/2016  . Left lower quadrant pain   . Coagulopathy ([place])   . Heme positive stool   . Symptomatic anemia 07/10/2016  . Syncope 05/15/2015  . Cervical disc disease 05/15/2015  . Abnormal urinalysis 05/15/2015  . Anemia 05/15/2015  . History of CVA (cerebrovascular accident) 04/03/2014  . Near syncope 02/15/2014  . Pre-syncope 02/15/2014  . Benign neoplasm of rectum and anal canal 12/02/2013  . Upper GI bleeding 11/30/2013  . Tobacco use disorder 11/30/2013  .  Anticoagulation goal of INR 2 to 3, for PAF - CHA2DS2Vasc = 7 08/15/2013  . Type 2 diabetes mellitus ([place]) 01/22/2013  . Tobacco abuse counseling 12/15/2012  . Hyperlipidemia   . Obesity (BMI 30-39.9) 12/14/2012  . Atrial flutter - status post CTI ablation 07/29/2010  . Essential hypertension 07/29/2010  . Chronic diastolic congestive heart failure ([place]) 07/29/2010  . Coronary artery disease, non-occlusive 07/29/2010    Past Surgical History:  Procedure Laterality Date  . ATRIAL ABLATION SURGERY  08/05/10   CTI ablation for atrial flutter by [facility]  .  CARDIAC CATHETERIZATION  2012   nl LV function, no occlusive CAD, PAF  . CARDIOVERSION  12/07/2010    Successful direct current cardioversion with atrial fibrillation to normal sinus rhythm  . CARPAL TUNNEL RELEASE Bilateral 01/30/2014   Procedure: BILATERAL CARPAL TUNNEL RELEASE;  Surgeon: Marianna Payment, MD;  Location: [facility];  Service: Orthopedics;  Laterality: Bilateral;  . CATARACT EXTRACTION W/ INTRAOCULAR LENS  IMPLANT, BILATERAL Bilateral   . COLONOSCOPY [name] 12/02/2013   Procedure: COLONOSCOPY;  Surgeon: Irene Shipper, MD;  Location: [facility];  Service: Endoscopy;  Laterality: [name];  . ESOPHAGOGASTRODUODENOSCOPY [name] 09/22/2014   Procedure: ESOPHAGOGASTRODUODENOSCOPY (EGD);  Surgeon: Ronald Lobo, MD;  Location: [facility] ENDOSCOPY;  Service: Endoscopy;  Laterality: [name];  . ESOPHAGOGASTRODUODENOSCOPY [name] 07/11/2016   Procedure: ESOPHAGOGASTRODUODENOSCOPY (EGD);  Surgeon: Doran Stabler, MD;  Location: [facility] ENDOSCOPY;  Service: Endoscopy;  Laterality: [name];  . INCISION AND DRAINAGE ABSCESS / HEMATOMA OF BURSA / KNEE / THIGH Left 1998   knee  . KNEE BURSECTOMY Left 1998  . LAPAROSCOPIC CHOLECYSTECTOMY  03/2010  . NM MYOVIEW LTD  07/22/2013   Normal EF ~60%, no ischemia or infarction.  [name] PARTIAL GASTRECTOMY  1982   subtotal; "took out 30% for ulcers"  . TRANSTHORACIC ECHOCARDIOGRAM  02/16/2014   EF 60%, no RWMA. - otherwise normal  . YAG LASER APPLICATION Bilateral         Home Medications    Prior to Admission medications   Medication Sig Start Date End Date Taking? Authorizing Provider  aspirin EC 81 MG tablet Take 81 mg by mouth daily.   Yes [provider]  co-enzyme Q-10 30 MG capsule Take 30 mg by mouth daily.    Yes [provider]  diltiazem (CARDIZEM CD) 120 MG 24 hr capsule Take 1 capsule (120 mg total) by mouth daily. 05/04/17  Yes Leonie Man, MD  flecainide (TAMBOCOR) 150 MG tablet Take 1.5 tablets (225 mg total) by mouth 2 (two) times daily. 05/04/17   Yes Leonie Man, MD  HYDROcodone-acetaminophen (NORCO/VICODIN) 5-325 MG tablet Take 1 tablet by mouth every 8 (eight) hours as needed for moderate pain.   Yes [provider]  hydrocortisone cream 1 % Apply 1 application topically 2 (two) times daily.  01/02/17  Yes [provider]  Insulin Glargine (LANTUS) 100 UNIT/ML Solostar Pen Inject 8 Units into the skin 2 (two) times daily. 11/30/16  Yes Domenic Polite, MD  loratadine (CLARITIN) 10 MG tablet Take 10 mg by mouth every evening.    Yes [provider]  metFORMIN (GLUCOPHAGE) 1000 MG tablet Take 1,000 mg by mouth 2 (two) times daily with a meal.    Yes [provider]  metoprolol succinate (TOPROL-XL) 25 MG 24 hr tablet TAKE 1/2 TABLET  (12.5 MG TOTAL) BY MOUTH DAILY. 05/04/17  Yes Leonie Man, MD  Multiple Vitamin (MULTIVITAMIN WITH MINERALS) TABS tablet Take 1 tablet by  mouth 2 (two) times daily.    Yes [provider]  oxybutynin (DITROPAN-XL) 5 MG 24 hr tablet Take 5 mg by mouth daily. 11/02/16 11/02/17 Yes [provider]  pantoprazole (PROTONIX) 40 MG tablet Take 1 tablet (40 mg total) by mouth 2 (two) times daily before a meal. 11/30/16  Yes Domenic Polite, MD  polyethylene glycol (MIRALAX / GLYCOLAX) packet Take 17 g by mouth 2 (two) times daily. Patient taking differently: Take 17 g by mouth daily as needed for moderate constipation.  10/03/16  Yes Sheikh, Omair Latif, DO  pravastatin (PRAVACHOL) 40 MG tablet Take 1 tablet (40 mg total) by mouth every evening. 05/04/17  Yes Leonie Man, MD  tamsulosin (FLOMAX) 0.4 MG CAPS capsule Take 0.4 mg by mouth daily. 02/20/17  Yes [provider]  cephALEXin (KEFLEX) 500 MG capsule Take 1 capsule (500 mg total) by mouth 2 (two) times daily. Patient not taking: Reported on 09/26/2017 07/27/17   Ripley Fraise, MD  nitroGLYCERIN (NITROSTAT) 0.4 MG SL tablet Place 1 tablet (0.4 mg total) under the tongue every 5 (five) minutes x 3 doses  as needed for chest pain. 11/19/15   Erlene Quan, PA-C    Family History Family History  Problem Relation Age of Onset  . Cancer Mother   . Heart attack Father   . Heart attack Brother     Social History Social History   Tobacco Use  . Smoking status: Current Every Day Smoker    Packs/day: 0.50    Years: 55.00    Pack years: 27.50    Types: Cigarettes  . Smokeless tobacco: Never Used  . Tobacco comment: He's been smoking between 0.5 and 2 ppd since age 89.  Substance Use Topics  . Alcohol use: No    Alcohol/week: 0.0 oz    Comment: "quit drinking in 1986"  . Drug use: No     Allergies   Cyclobenzaprine   Review of Systems Review of Systems  Constitutional: Negative for fever.  Respiratory: Negative for cough and shortness of breath.   Cardiovascular: Negative for chest pain.  Gastrointestinal: Negative for abdominal pain, blood in stool, nausea and vomiting.  Neurological: Positive for weakness (generalized). Negative for headaches.  All other systems reviewed and are negative.    Physical Exam Updated Vital Signs BP 102/67 (BP Location: Left Arm)   Pulse 78   Temp 97.8 F (36.6 C) (Oral)   Resp 16   Ht 5\' 6"  (1.676 m)   Wt 78.5 kg (173 lb)   SpO2 99%   BMI 27.92 kg/m   Physical Exam  Constitutional: He is oriented to person, place, and time. He appears well-developed and well-nourished.  HENT:  Head: Normocephalic and atraumatic.  Mouth/Throat: Oropharynx is clear and moist and mucous membranes are normal.  Eyes: Pupils are equal, round, and reactive to light. Conjunctivae, EOM and lids are normal.  No pale conjunctiva noted.  Neck: Full passive range of motion without pain.  Cardiovascular: Normal rate, regular rhythm and normal heart sounds. Exam reveals no gallop and no friction rub.  No murmur heard. Pulses:      Radial pulses are 2+ on the right side, and 2+ on the left side.       Dorsalis pedis pulses are 2+ on the right side, and 2+ on  the left side.  Pulmonary/Chest: Effort normal. He has rhonchi.  Diffuse rhonchi.  No evidence of respiratory distress.  Able to speak in full sentences without any  difficulty.  Abdominal: Soft. Normal appearance. There is no tenderness. There is no rigidity and no guarding.  Musculoskeletal: Normal range of motion.  Neurological: He is alert and oriented to person, place, and time.  Cranial nerves III-XII intact Follows commands, Moves all extremities  5/5 strength to BUE and BLE  Sensation intact throughout all major nerve distributions No dysmetria noted but patient has more trouble with left side entheses he states he is legally blind in his left eye so it is more difficult for him No dysdiadochokinesia. No pronator drift. No slurred speech. No facial droop.   Skin: Skin is warm and dry. Capillary refill takes less than 2 seconds.  Psychiatric: He has a normal mood and affect. His speech is normal.  Nursing note and vitals reviewed.    ED Treatments / Results  Labs (all labs ordered are listed, but only abnormal results are displayed) Labs Reviewed  CBC WITH DIFFERENTIAL/PLATELET - Abnormal; Notable for the following components:      Result Value   RBC 3.99 (*)    Hemoglobin 12.2 (*)    HCT 37.1 (*)    All other components within normal limits  URINALYSIS, ROUTINE W REFLEX MICROSCOPIC - Abnormal; Notable for the following components:   Leukocytes, UA MODERATE (*)    All other components within normal limits  COMPREHENSIVE METABOLIC PANEL - Abnormal; Notable for the following components:   Calcium 8.8 (*)    Total Protein 6.4 (*)    Albumin 3.2 (*)    All other components within normal limits  IRON AND TIBC - Abnormal; Notable for the following components:   Saturation Ratios 14 (*)    All other components within normal limits  FERRITIN - Abnormal; Notable for the following components:   Ferritin 14 (*)    All other components within normal limits  I-STAT TROPONIN, ED    TYPE AND SCREEN    EKG EKG Interpretation  Date/Time:  Wednesday September 26 2017 17:03:46 EDT Ventricular Rate:  85 PR Interval:    QRS Duration: 102 QT Interval:  380 QTC Calculation: 452 R Axis:   -11 Text Interpretation:  Sinus rhythm Prolonged PR interval Borderline low voltage, extremity leads ST elevation suggests acute pericarditis No significant change since last tracing Confirmed by Deno Etienne [phone]) on 09/26/2017 5:06:33 PM   Radiology Dg Chest 2 View  Result Date: 09/26/2017 CLINICAL DATA:  Generalized weakness and anemia. Numbness and tingling in the hands, arms and feet. EXAM: CHEST - 2 VIEW COMPARISON:  07/29/2017 FINDINGS: Heart size is normal. Mediastinal shadows are unremarkable except for aortic atherosclerosis. The lungs are clear. The vascularity is normal. No effusions. No acute bone finding. IMPRESSION: No active cardiopulmonary disease. Electronically Signed   By: Nelson Chimes M.D.   On: 09/26/2017 17:51    Procedures Procedures (including critical care time)  Medications Ordered in ED Medications  sodium chloride 0.9 % bolus 500 mL (0 mLs Intravenous Stopped 09/26/17 1849)     Initial Impression / Assessment and Plan / ED Course  I have reviewed the triage vital signs and the nursing notes.  Pertinent labs & imaging results that were available during my care of the patient were reviewed by me and considered in my medical decision making (see chart for details).     77 year old male with past medical history of  who presents for evaluation of generalized weakness x 4 days.  Feels that this is in his anemia.  No blood in stool.  No chest  pain, difficulty breathing, nausea/vomiting. Patient is afebrile .lal, non-toxic appearing, sitting comfortably on examination table. Vital signs reviewed and stable.  Patient with reassuring neuro exam.  He does not have any dysmetria but does have some difficulty with finger-to-nose on the left side because he states he is  legally blind in his left eye and has difficulty seeing my hand.  Otherwise neuro intact.  Consider symptomatic anemia versus infectious etiology versus ACS etiology. Do not  suspect CVA as patient states he has no focal weakness and states he is generalized just feels weak and fatigued.   Plan to check basic labs, EKG, troponin, chest x-ray.  CBC shows hemoglobin is 12.2 and hematocrit is 37.1.  Most recent hemoglobin 1 month showed hemoglobin of 13.5.  Previous review of CBC shows his baseline is 12.2 and 12.5.  CMP unremarkable.  UA shows moderate leukocytes.  I-STAT troponin negative.  Chest x-ray shows no acute infectious etiology.  Discussed patient with Dr. Darl Householder who independently evaluated the patient.  Patient's hemoglobin is 12.2.  No indication for transfusion here in the ED.  His iron is low but he is scheduled for an infusion in the next few weeks.  Patient can be discharged home with follow-up with his primary care doctor. Patient had ample opportunity for questions and discussion. All patient's questions were answered with full understanding. Strict return precautions discussed. Patient expresses understanding and agreement to plan.   Final Clinical Impressions(s) / ED Diagnoses   Final diagnoses:  Generalized weakness    ED Discharge Orders    None       Desma Mcgregor 09/26/17 2353    Drenda Freeze, MD 09/28/17 2308

## 2017-09-26 NOTE — ED Triage Notes (Addendum)
Pt to ED via [facility] with c/o of generalized weakness and anemia.  Pt states called his doctors office with complaints of increased weakness and they told him to come to ED to get his blood work done. Pt states that he has numbness and tingling in his hands, arms, feet, legs due to low Iron. Pt states "he supposed to have a Iron infusion in 12 weeks but he cant wait that long". Pt takes oxycodone for chronic lower back pain. A&O x4 and fully ambulatory today.

## 2017-10-04 ENCOUNTER — Encounter (HOSPITAL_COMMUNITY): Payer: Self-pay | Admitting: Emergency Medicine

## 2017-10-04 ENCOUNTER — Emergency Department (HOSPITAL_COMMUNITY): Payer: Medicare Other

## 2017-10-04 ENCOUNTER — Other Ambulatory Visit: Payer: Self-pay

## 2017-10-04 ENCOUNTER — Observation Stay (HOSPITAL_COMMUNITY)
Admission: EM | Admit: 2017-10-04 | Discharge: 2017-10-06 | Disposition: A | Discharge status: Home / Self Care | Payer: Medicare Other | Attending: Cardiology | Admitting: Cardiology

## 2017-10-04 DIAGNOSIS — I5032 Chronic diastolic (congestive) heart failure: Secondary | ICD-10-CM | POA: Diagnosis not present

## 2017-10-04 DIAGNOSIS — R0602 Shortness of breath: Secondary | ICD-10-CM | POA: Insufficient documentation

## 2017-10-04 DIAGNOSIS — E785 Hyperlipidemia, unspecified: Secondary | ICD-10-CM | POA: Insufficient documentation

## 2017-10-04 DIAGNOSIS — R2681 Unsteadiness on feet: Secondary | ICD-10-CM | POA: Insufficient documentation

## 2017-10-04 DIAGNOSIS — Z9049 Acquired absence of other specified parts of digestive tract: Secondary | ICD-10-CM | POA: Diagnosis not present

## 2017-10-04 DIAGNOSIS — I2510 Atherosclerotic heart disease of native coronary artery without angina pectoris: Secondary | ICD-10-CM | POA: Diagnosis present

## 2017-10-04 DIAGNOSIS — R531 Weakness: Secondary | ICD-10-CM | POA: Diagnosis not present

## 2017-10-04 DIAGNOSIS — Z903 Acquired absence of stomach [part of]: Secondary | ICD-10-CM | POA: Insufficient documentation

## 2017-10-04 DIAGNOSIS — I10 Essential (primary) hypertension: Secondary | ICD-10-CM | POA: Diagnosis present

## 2017-10-04 DIAGNOSIS — Z6827 Body mass index (BMI) 27.0-27.9, adult: Secondary | ICD-10-CM | POA: Insufficient documentation

## 2017-10-04 DIAGNOSIS — Z9842 Cataract extraction status, left eye: Secondary | ICD-10-CM | POA: Diagnosis not present

## 2017-10-04 DIAGNOSIS — Z8673 Personal history of transient ischemic attack (TIA), and cerebral infarction without residual deficits: Secondary | ICD-10-CM | POA: Diagnosis not present

## 2017-10-04 DIAGNOSIS — E669 Obesity, unspecified: Secondary | ICD-10-CM | POA: Insufficient documentation

## 2017-10-04 DIAGNOSIS — I25119 Atherosclerotic heart disease of native coronary artery with unspecified angina pectoris: Secondary | ICD-10-CM | POA: Diagnosis not present

## 2017-10-04 DIAGNOSIS — Z79899 Other long term (current) drug therapy: Secondary | ICD-10-CM | POA: Insufficient documentation

## 2017-10-04 DIAGNOSIS — R0902 Hypoxemia: Secondary | ICD-10-CM | POA: Diagnosis not present

## 2017-10-04 DIAGNOSIS — M1990 Unspecified osteoarthritis, unspecified site: Secondary | ICD-10-CM | POA: Insufficient documentation

## 2017-10-04 DIAGNOSIS — Z7982 Long term (current) use of aspirin: Secondary | ICD-10-CM | POA: Diagnosis not present

## 2017-10-04 DIAGNOSIS — Z809 Family history of malignant neoplasm, unspecified: Secondary | ICD-10-CM | POA: Insufficient documentation

## 2017-10-04 DIAGNOSIS — J449 Chronic obstructive pulmonary disease, unspecified: Secondary | ICD-10-CM | POA: Diagnosis not present

## 2017-10-04 DIAGNOSIS — I499 Cardiac arrhythmia, unspecified: Secondary | ICD-10-CM | POA: Diagnosis not present

## 2017-10-04 DIAGNOSIS — D649 Anemia, unspecified: Secondary | ICD-10-CM | POA: Insufficient documentation

## 2017-10-04 DIAGNOSIS — I480 Paroxysmal atrial fibrillation: Secondary | ICD-10-CM | POA: Diagnosis not present

## 2017-10-04 DIAGNOSIS — Z888 Allergy status to other drugs, medicaments and biological substances status: Secondary | ICD-10-CM | POA: Insufficient documentation

## 2017-10-04 DIAGNOSIS — I110 Hypertensive heart disease with heart failure: Secondary | ICD-10-CM | POA: Insufficient documentation

## 2017-10-04 DIAGNOSIS — E119 Type 2 diabetes mellitus without complications: Secondary | ICD-10-CM | POA: Diagnosis not present

## 2017-10-04 DIAGNOSIS — R109 Unspecified abdominal pain: Secondary | ICD-10-CM

## 2017-10-04 DIAGNOSIS — R079 Chest pain, unspecified: Secondary | ICD-10-CM

## 2017-10-04 DIAGNOSIS — M4722 Other spondylosis with radiculopathy, cervical region: Secondary | ICD-10-CM | POA: Insufficient documentation

## 2017-10-04 DIAGNOSIS — Z8711 Personal history of peptic ulcer disease: Secondary | ICD-10-CM | POA: Insufficient documentation

## 2017-10-04 DIAGNOSIS — E782 Mixed hyperlipidemia: Secondary | ICD-10-CM | POA: Diagnosis present

## 2017-10-04 DIAGNOSIS — R55 Syncope and collapse: Secondary | ICD-10-CM | POA: Diagnosis present

## 2017-10-04 DIAGNOSIS — I340 Nonrheumatic mitral (valve) insufficiency: Secondary | ICD-10-CM | POA: Insufficient documentation

## 2017-10-04 DIAGNOSIS — Z8249 Family history of ischemic heart disease and other diseases of the circulatory system: Secondary | ICD-10-CM | POA: Insufficient documentation

## 2017-10-04 DIAGNOSIS — Z794 Long term (current) use of insulin: Secondary | ICD-10-CM | POA: Insufficient documentation

## 2017-10-04 DIAGNOSIS — R0789 Other chest pain: Secondary | ICD-10-CM | POA: Diagnosis not present

## 2017-10-04 DIAGNOSIS — Z9841 Cataract extraction status, right eye: Secondary | ICD-10-CM | POA: Insufficient documentation

## 2017-10-04 DIAGNOSIS — K5900 Constipation, unspecified: Secondary | ICD-10-CM | POA: Insufficient documentation

## 2017-10-04 DIAGNOSIS — F17210 Nicotine dependence, cigarettes, uncomplicated: Secondary | ICD-10-CM | POA: Insufficient documentation

## 2017-10-04 DIAGNOSIS — I213 ST elevation (STEMI) myocardial infarction of unspecified site: Secondary | ICD-10-CM | POA: Diagnosis not present

## 2017-10-04 DIAGNOSIS — I4892 Unspecified atrial flutter: Secondary | ICD-10-CM | POA: Diagnosis present

## 2017-10-04 DIAGNOSIS — I25118 Atherosclerotic heart disease of native coronary artery with other forms of angina pectoris: Secondary | ICD-10-CM | POA: Diagnosis present

## 2017-10-04 LAB — CBC
HCT: 37.1 % — ABNORMAL LOW (ref 39.0–52.0)
HEMATOCRIT: 34.9 % — AB (ref 39.0–52.0)
HEMOGLOBIN: 11.9 g/dL — AB (ref 13.0–17.0)
Hemoglobin: 11.3 g/dL — ABNORMAL LOW (ref 13.0–17.0)
MCH: 30.3 pg (ref 26.0–34.0)
MCH: 30.4 pg (ref 26.0–34.0)
MCHC: 32.4 g/dL (ref 30.0–36.0)
MCHC: 32.1 g/dL (ref 30.0–36.0)
MCV: 93.6 fL (ref 78.0–100.0)
MCV: 94.6 fL (ref 78.0–100.0)
PLATELETS: 251 10*3/uL (ref 150–400)
PLATELETS: 269 10*3/uL (ref 150–400)
RBC: 3.73 MIL/uL — ABNORMAL LOW (ref 4.22–5.81)
RBC: 3.92 MIL/uL — ABNORMAL LOW (ref 4.22–5.81)
RDW: 13.8 % (ref 11.5–15.5)
RDW: 13.8 % (ref 11.5–15.5)
WBC: 5.8 10*3/uL (ref 4.0–10.5)
WBC: 6.8 10*3/uL (ref 4.0–10.5)

## 2017-10-04 LAB — BASIC METABOLIC PANEL
Anion gap: 11 (ref 5–15)
BUN: 12 mg/dL (ref 8–23)
CO2: 22 mmol/L (ref 22–32)
CREATININE: 0.88 mg/dL (ref 0.61–1.24)
Calcium: 8.6 mg/dL — ABNORMAL LOW (ref 8.9–10.3)
Chloride: 104 mmol/L (ref 98–111)
GFR calc Af Amer: >60 mL/min (ref >60)
GLUCOSE: 108 mg/dL — AB (ref 70–99)
POTASSIUM: 4.4 mmol/L (ref 3.5–5.1)
SODIUM: 137 mmol/L (ref 135–145)

## 2017-10-04 LAB — GLUCOSE, CAPILLARY
Glucose-Capillary: 110 mg/dL — ABNORMAL HIGH (ref 70–99)
Glucose-Capillary: 162 mg/dL — ABNORMAL HIGH (ref 70–99)

## 2017-10-04 LAB — CREATININE, SERUM
CREATININE: 0.88 mg/dL (ref 0.61–1.24)
GFR calc Af Amer: >60 mL/min (ref >60)

## 2017-10-04 LAB — I-STAT TROPONIN, ED: Troponin i, poc: 0.01 ng/mL (ref 0.00–0.08)

## 2017-10-04 MED ORDER — DILTIAZEM HCL ER COATED BEADS 120 MG PO CP24: 120.0000 mg | ORAL_CAPSULE | Freq: Every day | ORAL | Status: DC

## 2017-10-04 MED ORDER — ONDANSETRON HCL 4 MG/2ML IJ SOLN: 4.0000 mg | Freq: Four times a day (QID) | INTRAMUSCULAR | Status: DC | PRN

## 2017-10-04 MED ORDER — METOPROLOL TARTRATE 50 MG PO TABS: 50.0000 mg | ORAL_TABLET | Freq: Once | ORAL | Status: DC

## 2017-10-04 MED ORDER — ASPIRIN EC 325 MG PO TBEC
325.0000 mg | DELAYED_RELEASE_TABLET | Freq: Every day | ORAL | Status: DC
  Administered 2017-10-05 – 2017-10-06 (×2): 325 mg via ORAL
  Filled 2017-10-04 (×2): qty 1

## 2017-10-04 MED ORDER — GI COCKTAIL ~~LOC~~: 30.0000 mL | Freq: Four times a day (QID) | ORAL | Status: DC | PRN

## 2017-10-04 MED ORDER — METOPROLOL SUCCINATE ER 25 MG PO TB24
25.0000 mg | ORAL_TABLET | Freq: Every day | ORAL | Status: DC
  Administered 2017-10-04: 25 mg via ORAL
  Filled 2017-10-04: qty 1

## 2017-10-04 MED ORDER — MORPHINE SULFATE (PF) 4 MG/ML IV SOLN
2.0000 mg | INTRAVENOUS | Status: DC | PRN
  Administered 2017-10-04 – 2017-10-06 (×4): 2 mg via INTRAVENOUS
  Filled 2017-10-04 (×4): qty 1

## 2017-10-04 MED ORDER — IPRATROPIUM-ALBUTEROL 0.5-2.5 (3) MG/3ML IN SOLN
3.0000 mL | Freq: Four times a day (QID) | RESPIRATORY_TRACT | Status: DC
  Administered 2017-10-04 – 2017-10-05 (×3): 3 mL via RESPIRATORY_TRACT
  Filled 2017-10-04 (×3): qty 3

## 2017-10-04 MED ORDER — ENOXAPARIN SODIUM 30 MG/0.3ML ~~LOC~~ SOLN
30.0000 mg | SUBCUTANEOUS | Status: DC
  Administered 2017-10-04: 30 mg via SUBCUTANEOUS
  Filled 2017-10-04: qty 0.3

## 2017-10-04 MED ORDER — FLECAINIDE ACETATE 100 MG PO TABS
225.0000 mg | ORAL_TABLET | Freq: Two times a day (BID) | ORAL | Status: DC
  Administered 2017-10-04 – 2017-10-06 (×4): 225 mg via ORAL
  Filled 2017-10-04 (×4): qty 1

## 2017-10-04 MED ORDER — ACETAMINOPHEN 325 MG PO TABS: 650.0000 mg | ORAL_TABLET | ORAL | Status: DC | PRN

## 2017-10-04 NOTE — H&P (Signed)
History and Physical    [name] QIH:[id number] DOB: 1940/07/21 DOA: 10/04/2017  PCP: [name], MD  Patient coming from: Home  I have personally briefly reviewed patient's old medical records in [place]  Chief Complaint: Chest pain with associated shortness of breath and near syncope x2  HPI: [name] is a 77 y.o. male with medical history significant of obstructive coronary disease with last cardiac catheterization in 2012 and a negative nuclear test in 2015, retention, history of atrial flutter status post ablation currently maintaining ectopic atrial rhythm with an increased PR interval, COPD, diabetes on oral agents and insulin, ongoing tobacco abuse with a history of a Billroth II gastrojejunostomy procedure.  At that time he has had several admissions for syncope symptomatic anemia his last admission being in February 2019.  This time patient presents with complaints of chest pain.  He states that he felt a pressure across his chest but no real pain he had some associated shortness of breath and he felt like he was going to pass out twice but only had slight sensations of nausea but no vomiting.  This happened twice and then the patient decided to come into the emergency department.  He called EMS and got lingual nitroglycerin and felt better after that he was also given an aspirin.  His troponins are negative in the emergency department and his KG is unremarkable.  Currently denies any chest pain or pressure at the present time.  Does complain of associated right arm pain and discomfort.  He has had a cholecystectomy. ED Course: EKG, troponin, blood work, and evaluation was unremarkable.  Given risk factors and a heart score of 5 he was referred to [place] for further evaluation and management  Review of Systems: As per HPI otherwise all other systems reviewed and  negative.    Past Medical History:  Diagnosis Date  . Anemia    takes Ferrous Sulfate daily  .  Arthritis    "all over"  . Balance problem 01/2014  . CAP (community acquired pneumonia) 09/18/2014  . Cervical radiculopathy due to degenerative joint disease of spine   . COPD (chronic obstructive pulmonary disease) ([place])   . Coronary artery disease, non-occlusive    a. 03/2010 Nonocclusive disease by cath, performed for ST elevations on ECG;  b. 06/2013 Lexi MV: EF 60%, no ischemia.  . Diabetes mellitus type II    takes Metformin and Lantus daily  . Diastolic CHF, chronic ([place])    a. 12/2012 EF 55-60%, diast dysfxn, triv MR, mildly dil LA/RA.  [name] History of blood transfusion 1982   "when I had stomach OR"  . History of bronchitis    1998  . History of gastric ulcer   . HTN (hypertension)    takes Diltiazem daily  . Hyperlipidemia    takes Pravastatin daily  . Joint pain   . PAF (paroxysmal atrial fibrillation) (HCC)    Recurrent after atrial flutter (a. 07/2010 Status post caval tricuspid isthmus ablation by Dr. Midge Aver Metoprolol daily), currently controlled on flecainide plus diltiazem and Coumadin  . Weakness    numbness and tingling both hands    Past Surgical History:  Procedure Laterality Date  . ATRIAL ABLATION SURGERY  08/05/10   CTI ablation for atrial flutter by JA  . CARDIAC CATHETERIZATION  2012   nl LV function, no occlusive CAD, PAF  . CARDIOVERSION  12/07/2010    Successful direct current cardioversion with atrial fibrillation to normal sinus rhythm  .  CARPAL TUNNEL RELEASE Bilateral 01/30/2014   Procedure: BILATERAL CARPAL TUNNEL RELEASE;  Surgeon: Marianna Payment, MD;  Location: [facility];  Service: Orthopedics;  Laterality: Bilateral;  . CATARACT EXTRACTION W/ INTRAOCULAR LENS  IMPLANT, BILATERAL Bilateral   . COLONOSCOPY [name] 12/02/2013   Procedure: COLONOSCOPY;  Surgeon: Irene Shipper, MD;  Location: [facility];  Service: Endoscopy;  Laterality: [name];  . ESOPHAGOGASTRODUODENOSCOPY [name] 09/22/2014   Procedure: ESOPHAGOGASTRODUODENOSCOPY (EGD);  Surgeon: Ronald Lobo, MD;  Location: [facility] ENDOSCOPY;  Service: Endoscopy;  Laterality: [name];  . ESOPHAGOGASTRODUODENOSCOPY [name] 07/11/2016   Procedure: ESOPHAGOGASTRODUODENOSCOPY (EGD);  Surgeon: Doran Stabler, MD;  Location: [facility] ENDOSCOPY;  Service: Endoscopy;  Laterality: [name];  . INCISION AND DRAINAGE ABSCESS / HEMATOMA OF BURSA / KNEE / THIGH Left 1998   knee  . KNEE BURSECTOMY Left 1998  . LAPAROSCOPIC CHOLECYSTECTOMY  03/2010  . NM MYOVIEW LTD  07/22/2013   Normal EF ~60%, no ischemia or infarction.  [name] PARTIAL GASTRECTOMY  1982   subtotal; "took out 30% for ulcers"  . TRANSTHORACIC ECHOCARDIOGRAM  02/16/2014   EF 60%, no RWMA. - otherwise normal  . YAG LASER APPLICATION Bilateral     Social History   Social History Narrative   He is a widower, who moved to [place] in 2011 to live closer to his daughter. He formerly lived in [place]. He is a father of 29, grandfather, and great-grandfather of 54. Does not drink alcohol, context of smoker.     reports that he has been smoking cigarettes.  He has a 27.50 pack-year smoking history. He has never used smokeless tobacco. He reports that he does not drink alcohol or use drugs.  Allergies  Allergen Reactions  . Cyclobenzaprine Other (See Comments)    Unsteady gait    Family History  Problem Relation Age of Onset  . Cancer Mother   . Heart attack Father   . Heart attack Brother      Prior to Admission medications   Medication Sig Start Date End Date Taking? Authorizing Provider  aspirin EC 81 MG tablet Take 81 mg by mouth daily.   Yes [provider]  co-enzyme Q-10 30 MG capsule Take 30 mg by mouth daily.    Yes [provider]  diltiazem (CARDIZEM CD) 120 MG 24 hr capsule Take 1 capsule (120 mg total) by mouth daily. 05/04/17  Yes Leonie Man, MD  flecainide (TAMBOCOR) 150 MG tablet Take 1.5 tablets (225 mg total) by mouth 2 (two) times daily. 05/04/17  Yes Leonie Man, MD  HYDROcodone-acetaminophen  (NORCO/VICODIN) 5-325 MG tablet Take 1 tablet by mouth every 8 (eight) hours as needed for moderate pain.   Yes [provider]  hydrocortisone cream 1 % Apply 1 application topically 2 (two) times daily.  01/02/17  Yes [provider]  Insulin Glargine (LANTUS) 100 UNIT/ML Solostar Pen Inject 8 Units into the skin 2 (two) times daily. 11/30/16  Yes Domenic Polite, MD  loratadine (CLARITIN) 10 MG tablet Take 10 mg by mouth every evening.    Yes [provider]  metFORMIN (GLUCOPHAGE) 1000 MG tablet Take 1,000 mg by mouth 2 (two) times daily with a meal.    Yes [provider]  metoprolol succinate (TOPROL-XL) 25 MG 24 hr tablet TAKE 1/2 TABLET  (12.5 MG TOTAL) BY MOUTH DAILY. 05/04/17  Yes Leonie Man, MD  Multiple Vitamin (MULTIVITAMIN WITH MINERALS) TABS tablet Take 1 tablet by mouth 2 (two) times daily.  Yes [provider]  nitroGLYCERIN (NITROSTAT) 0.4 MG SL tablet Place 1 tablet (0.4 mg total) under the tongue every 5 (five) minutes x 3 doses as needed for chest pain. 11/19/15  Yes Kilroy, Luke K, PA-C  oxybutynin (DITROPAN-XL) 5 MG 24 hr tablet Take 5 mg by mouth daily. 11/02/16 11/02/17 Yes [provider]  pantoprazole (PROTONIX) 40 MG tablet Take 1 tablet (40 mg total) by mouth 2 (two) times daily before a meal. 11/30/16  Yes Domenic Polite, MD  polyethylene glycol (MIRALAX / GLYCOLAX) packet Take 17 g by mouth 2 (two) times daily. Patient taking differently: Take 17 g by mouth daily as needed for moderate constipation.  10/03/16  Yes Sheikh, Omair Latif, DO  pravastatin (PRAVACHOL) 40 MG tablet Take 1 tablet (40 mg total) by mouth every evening. 05/04/17  Yes Leonie Man, MD  tamsulosin (FLOMAX) 0.4 MG CAPS capsule Take 0.4 mg by mouth daily. 02/20/17  Yes [provider]    Physical Exam:  Constitutional: NAD, calm, comfortable Vitals:   10/04/17 1319 10/04/17 1445 10/04/17 1515 10/04/17 1530  BP:  (!) 95/53 (!) 87/54 (!)  89/55  Pulse:  64 67 69  Resp:  11 20 15   Temp:      TempSrc:      SpO2:  97% 97% 95%  Weight: 78.5 kg (173 lb)      Eyes: PERRL, lids and conjunctivae normal ENMT: Mucous membranes are moist. Posterior pharynx clear of any exudate or lesions.Normal dentition.  Neck: normal, supple, no masses, no thyromegaly Respiratory: clear to auscultation bilaterally, no wheezing, no crackles. Normal respiratory effort. No accessory muscle use.  Cardiovascular: Regular rate and rhythm, no murmurs / rubs / gallops. No extremity edema. 2+ pedal pulses. No carotid bruits.  Abdomen: no tenderness, no masses palpated. No hepatosplenomegaly. Bowel sounds positive.  Musculoskeletal: no clubbing / cyanosis. No joint deformity upper and lower extremities. Good ROM, no contractures. Normal muscle tone.  Skin: no rashes, lesions, ulcers. No induration Neurologic: CN 2-12 grossly intact. Sensation intact, DTR normal. Strength 5/5 in all 4.  Psychiatric: Normal judgment and insight. Alert and oriented x 3. Normal mood.     Labs on Admission: I have personally reviewed following labs and imaging studies  CBC: Recent Labs  Lab 10/04/17 1325 10/04/17 1549  WBC 5.8 6.8  HGB 11.3* 11.9*  HCT 34.9* 37.1*  MCV 93.6 94.6  PLT 251 366   Basic Metabolic Panel: Recent Labs  Lab 10/04/17 1325  NA 137  K 4.4  CL 104  CO2 22  GLUCOSE 108*  BUN 12  CREATININE 0.88  CALCIUM 8.6*   GFR: Estimated Creatinine Clearance: 68.2 mL/min (by C-G formula based on SCr of 0.88 mg/dL). Urine analysis:    Component Value Date/Time   COLORURINE YELLOW 09/26/2017 1737   APPEARANCEUR CLEAR 09/26/2017 1737   LABSPEC 1.018 09/26/2017 1737   PHURINE 5.0 09/26/2017 1737   GLUCOSEU NEGATIVE 09/26/2017 1737   HGBUR NEGATIVE 09/26/2017 1737   BILIRUBINUR NEGATIVE 09/26/2017 1737   KETONESUR NEGATIVE 09/26/2017 1737   PROTEINUR NEGATIVE 09/26/2017 1737   UROBILINOGEN 0.2 10/09/2014 2315   NITRITE NEGATIVE 09/26/2017 1737    LEUKOCYTESUR MODERATE (A) 09/26/2017 1737    Radiological Exams on Admission: Dg Chest 2 View  Result Date: 10/04/2017 CLINICAL DATA:  Intermittent chest pressure radiating to the right shoulder and arm. EXAM: CHEST - 2 VIEW COMPARISON:  Chest x-ray dated September 26, 2017. FINDINGS: The heart size and mediastinal contours are within  normal limits. Normal pulmonary vascularity. No focal consolidation, pleural effusion, or pneumothorax. No acute osseous abnormality. IMPRESSION: No active cardiopulmonary disease. Electronically Signed   By: Titus Dubin M.D.   On: 10/04/2017 14:24    EKG: Independently reviewed.  Ectopic atrial rhythm with increased PR interval and left axis deviation unchanged from previous.  Assessment/Plan Principal Problem:   Chest pain Active Problems:   Coronary artery disease, non-occlusive   Atrial flutter - status post CTI ablation   Essential hypertension   Chronic diastolic congestive heart failure (HCC)   Hyperlipidemia   Type 2 diabetes mellitus (HCC)   Near syncope   Symptomatic anemia   COPD (chronic obstructive pulmonary disease) ([place])   1.  Chest pain: He will be placed in chest pain observation.  We will rule out myocardial infarction with serial enzymes and EKGs.  If EKG and enzymes unremarkable tomorrow morning we will plan stress test.  I have consulted cardiology for further evaluation and management.  Patient is followed by Dr. [name] who he last saw in February 2019.  He is followed for atrial flutter that was ablated.  2.  Coronary artery disease nonocclusive: Patient has had a cardiac catheterization in 2012 which revealed a 20% LAD and a low risk Myoview in April 2015.  We will proceed as above.  3.  Atrial flutter status post CTI ablation: Currently in ectopic atrial rhythm.  4.  Essential hypertension: Continue home medications.  5.  Chronic diastolic congestive heart failure: Noted currently well compensated.  6.  Hyperlipidemia  continue home treatment.  7.  Diabetes type 2: Continue home medications both insulin and oral medications.  6.  Apparent near syncopal event associated with chest pain: This seems like it was a vasovagal episode and I think that the pressure in his chest which was his chest pain equivalent is also related to a vasovagal response however given his risk factors and heart score we will proceed with stress testing.  8.  Symptomatic anemia: Patient receives iron transfusions fairly regularly.  9.  COPD: Currently stable and compensated.  Patient reports not smoking recently.  DVT prophylaxis: *Lovenox Code Status: Full code Family Communication: Daughter who is present at the bedside Disposition Plan: Likely home in 24 hours Consults called: Katharina Caper with cardiology Admission status: *Observation   Lady Deutscher MD FACP [facility] Pager [phone]  If 7PM-7AM, please contact night-coverage [email or web address] Password [facility]  10/04/2017, 4:01 PM

## 2017-10-04 NOTE — ED Triage Notes (Signed)
PT arrives via EMS from home. PT reports intermittent central chest pressure that radiates to right shoulder / arm.  Pain was worse this afternoon with associated SOB. PT reports he felt like he was going to pass out twice this AM.   PT was given 324 mg ASA and one SL nitro tablet. BP dropped after one nitro to 90 systolic. Pain is no longer present.

## 2017-10-04 NOTE — ED Notes (Signed)
Per MD, Pt ok to eat/drink. Given a diet sprite at 15:14.

## 2017-10-04 NOTE — ED Notes (Signed)
Attempted report 

## 2017-10-04 NOTE — ED Notes (Signed)
Patient transported to X-ray 

## 2017-10-04 NOTE — Consult Note (Addendum)
Cardiology Consultation:   Patient ID: [name]; [id number]; Nov 19, 1940   Admit date: 10/04/2017 Date of Consult: 10/04/2017  Primary Care Provider: [name], MD Primary Cardiologist: [name]  Primary Electrophysiologist:  None   Patient Profile:   [name] is a 77 y.o. male with a PMH of non-obstructive CAD (noted on cath 03/2010), atrial fibrillation/flutter s/p ablation with recurrence (now on flecainide; not on anticoagulation due to anemia), chronic diastolic CHF, HTN, HLD, DM type 2, and COPD who is being seen today for the evaluation of chest pain at the request of Dr. Evangeline Gula.  History of Present Illness:   [name] has been experiencing intermittent chest pressure for quite some time. He reports having one episode ever 2-3 weeks for possibly up to a year. He states his chest pressure typically does not last very long but yesterday evening he had an episode that persisted for hours. It eventually resolved allowing him to sleep, however reoccurred early this morning waking him from sleep. He reports associated SOB and radiation of pain to his right shoulder/arm. There were no alleviating or aggravating factors. He denies associated diaphoresis, nausea, vomiting, or dizziness. He reports a couple episodes of presyncope which improved upon sitting down in his recliner; no syncope. Given persistence of symptoms, he activated EMS. He was given ASA 324mg  and 1 SL nitro prior to arrival. He was then noted to be hypotensive and was given IVF en route to the ED.   He was last seen by Dr. [name] 04/2017 and was thought to be doing fairly well from a cardiac standpoint. He had chronic pedal edema and some SOB at that time but was without CP complaints. No medication changes occurred at this visit. His last echo was in 2016 with EF 50-55% with no wall motion abnormalities. Last ischemic evaluation was a NST in 2015 which was negative for ischemia. He had a cardiac  catheterization in 2012 which revealed non-obstructive CAD (results detailed below). He reports continued smoking, now 1/2 ppd, although smoked 2 ppd in his peak use.   At the time of this evaluation he is chest pain free. He denies any exertional chest pain or DOE. He reports chronic back pain which limits his mobility. He has a chronic cough and has not noticed any increase sputum production or fevers. He denies being told he has COPD and is not on any maintenance medications. He reports intermittent facial flushing but has not been able to distinguish a pattern of occurrence.   Hospital course: hypotensive, otherwise VSS. Labs notable for electrolytes wnl, Cr 0.88, Hgb 11.3, PLT 251, Trop 0.01. CXR without acute findings. EKG with sinus rhythm with 1st degree AV block, submm STE in inferior leads (seen on previous), otherwise no STE/D. He was given ASA and SL nitro en route to the ED. Admitted to medicine with cardiology consulting for chest pain.   Past Medical History:  Diagnosis Date  . Anemia    takes Ferrous Sulfate daily  . Arthritis    "all over"  . Balance problem 01/2014  . CAP (community acquired pneumonia) 09/18/2014  . Cervical radiculopathy due to degenerative joint disease of spine   . COPD (chronic obstructive pulmonary disease) ([place])   . Coronary artery disease, non-occlusive    a. 03/2010 Nonocclusive disease by cath, performed for ST elevations on ECG;  b. 06/2013 Lexi MV: EF 60%, no ischemia.  . Diabetes mellitus type II    takes Metformin and Lantus daily  . Diastolic CHF,  chronic ([place])    a. 12/2012 EF 55-60%, diast dysfxn, triv MR, mildly dil LA/RA.  [name] History of blood transfusion 1982   "when I had stomach OR"  . History of bronchitis    1998  . History of gastric ulcer   . HTN (hypertension)    takes Diltiazem daily  . Hyperlipidemia    takes Pravastatin daily  . Joint pain   . PAF (paroxysmal atrial fibrillation) ([facility])    Recurrent after atrial flutter (a.  07/2010 Status post caval tricuspid isthmus ablation by Dr. Midge Aver Metoprolol daily), currently controlled on flecainide plus diltiazem and Coumadin  . Weakness    numbness and tingling both hands    Past Surgical History:  Procedure Laterality Date  . ATRIAL ABLATION SURGERY  08/05/10   CTI ablation for atrial flutter by JA  . CARDIAC CATHETERIZATION  2012   nl LV function, no occlusive CAD, PAF  . CARDIOVERSION  12/07/2010    Successful direct current cardioversion with atrial fibrillation to normal sinus rhythm  . CARPAL TUNNEL RELEASE Bilateral 01/30/2014   Procedure: BILATERAL CARPAL TUNNEL RELEASE;  Surgeon: Marianna Payment, MD;  Location: [facility];  Service: Orthopedics;  Laterality: Bilateral;  . CATARACT EXTRACTION W/ INTRAOCULAR LENS  IMPLANT, BILATERAL Bilateral   . COLONOSCOPY [name] 12/02/2013   Procedure: COLONOSCOPY;  Surgeon: Irene Shipper, MD;  Location: [place];  Service: Endoscopy;  Laterality: [name];  . ESOPHAGOGASTRODUODENOSCOPY [name] 09/22/2014   Procedure: ESOPHAGOGASTRODUODENOSCOPY (EGD);  Surgeon: Ronald Lobo, MD;  Location[facility] ENDOSCOPY;  Service: Endoscopy;  Laterality: [name];  . ESOPHAGOGASTRODUODENOSCOPY [name] 07/11/2016   Procedure: ESOPHAGOGASTRODUODENOSCOPY (EGD);  Surgeon: Doran Stabler, MD;  Location[facility] ENDOSCOPY;  Service: Endoscopy;  Laterality: [name];  . INCISION AND DRAINAGE ABSCESS / HEMATOMA OF BURSA / KNEE / THIGH Left 1998   knee  . KNEE BURSECTOMY Left 1998  . LAPAROSCOPIC CHOLECYSTECTOMY  03/2010  . NM MYOVIEW LTD  07/22/2013   Normal EF ~60%, no ischemia or infarction.  [name] PARTIAL GASTRECTOMY  1982   subtotal; "took out 30% for ulcers"  . TRANSTHORACIC ECHOCARDIOGRAM  02/16/2014   EF 60%, no RWMA. - otherwise normal  . YAG LASER APPLICATION Bilateral      Home Medications:  Prior to Admission medications   Medication Sig Start Date End Date Taking? Authorizing Provider  aspirin EC 81 MG tablet Take 81 mg by mouth daily.   Yes [provider]  co-enzyme Q-10 30 MG capsule Take 30 mg by mouth daily.    Yes [provider]  diltiazem (CARDIZEM CD) 120 MG 24 hr capsule Take 1 capsule (120 mg total) by mouth daily. 05/04/17  Yes Leonie Man, MD  flecainide (TAMBOCOR) 150 MG tablet Take 1.5 tablets (225 mg total) by mouth 2 (two) times daily. 05/04/17  Yes Leonie Man, MD  HYDROcodone-acetaminophen (NORCO/VICODIN) 5-325 MG tablet Take 1 tablet by mouth every 8 (eight) hours as needed for moderate pain.   Yes [provider]  hydrocortisone cream 1 % Apply 1 application topically 2 (two) times daily.  01/02/17  Yes [provider]  Insulin Glargine (LANTUS) 100 UNIT/ML Solostar Pen Inject 8 Units into the skin 2 (two) times daily. 11/30/16  Yes Domenic Polite, MD  loratadine (CLARITIN) 10 MG tablet Take 10 mg by mouth every evening.    Yes [provider]  metFORMIN (GLUCOPHAGE) 1000 MG tablet Take 1,000 mg by mouth 2 (two) times daily with a meal.  Yes [provider]  metoprolol succinate (TOPROL-XL) 25 MG 24 hr tablet TAKE 1/2 TABLET  (12.5 MG TOTAL) BY MOUTH DAILY. 05/04/17  Yes Leonie Man, MD  Multiple Vitamin (MULTIVITAMIN WITH MINERALS) TABS tablet Take 1 tablet by mouth 2 (two) times daily.    Yes [provider]  nitroGLYCERIN (NITROSTAT) 0.4 MG SL tablet Place 1 tablet (0.4 mg total) under the tongue every 5 (five) minutes x 3 doses as needed for chest pain. 11/19/15  Yes Kilroy, Luke K, PA-C  oxybutynin (DITROPAN-XL) 5 MG 24 hr tablet Take 5 mg by mouth daily. 11/02/16 11/02/17 Yes [provider]  pantoprazole (PROTONIX) 40 MG tablet Take 1 tablet (40 mg total) by mouth 2 (two) times daily before a meal. 11/30/16  Yes Domenic Polite, MD  polyethylene glycol (MIRALAX / GLYCOLAX) packet Take 17 g by mouth 2 (two) times daily. Patient taking differently: Take 17 g by mouth daily as needed for moderate constipation.  10/03/16  Yes Sheikh, Omair Latif, DO    pravastatin (PRAVACHOL) 40 MG tablet Take 1 tablet (40 mg total) by mouth every evening. 05/04/17  Yes Leonie Man, MD  tamsulosin (FLOMAX) 0.4 MG CAPS capsule Take 0.4 mg by mouth daily. 02/20/17  Yes [provider]    Inpatient Medications: Scheduled Meds: . aspirin EC  325 mg Oral Daily  . enoxaparin (LOVENOX) injection  30 mg Subcutaneous Q24H   Continuous Infusions:  PRN Meds: acetaminophen, gi cocktail, morphine injection, ondansetron (ZOFRAN) IV  Allergies:    Allergies  Allergen Reactions  . Cyclobenzaprine Other (See Comments)    Unsteady gait    Social History:   Social History   Socioeconomic History  . Marital status: Widowed    Spouse name: Not on file  . Number of children: Not on file  . Years of education: Not on file  . Highest education level: Not on file  Occupational History  . Occupation: Retired  Scientific laboratory technician  . Financial resource strain: Not on file  . Food insecurity:    Worry: Not on file    Inability: Not on file  . Transportation needs:    Medical: Not on file    Non-medical: Not on file  Tobacco Use  . Smoking status: Current Every Day Smoker    Packs/day: 0.50    Years: 55.00    Pack years: 27.50    Types: Cigarettes  . Smokeless tobacco: Never Used  . Tobacco comment: He's been smoking between 0.5 and 2 ppd since age 51.  Substance and Sexual Activity  . Alcohol use: No    Alcohol/week: 0.0 oz    Comment: "quit drinking in 1986"  . Drug use: No  . Sexual activity: Never  Lifestyle  . Physical activity:    Days per week: Not on file    Minutes per session: Not on file  . Stress: Not on file  Relationships  . Social connections:    Talks on phone: Not on file    Gets together: Not on file    Attends religious service: Not on file    Active member of club or organization: Not on file    Attends meetings of clubs or organizations: Not on file    Relationship status: Not on file  . Intimate partner violence:     Fear of current or ex partner: Not on file    Emotionally abused: Not on file    Physically abused: Not on file  Forced sexual activity: Not on file  Other Topics Concern  . Not on file  Social History Narrative   He is a widower, who moved to [place] in 2011 to live closer to his daughter. He formerly lived in [place]. He is a father of 33, grandfather, and great-grandfather of 42. Does not drink alcohol, context of smoker.    Family History:    Family History  Problem Relation Age of Onset  . Cancer Mother   . Heart attack Father   . Heart attack Brother      ROS:  Please see the history of present illness.   All other ROS reviewed and negative.     Physical Exam/Data:   Vitals:   10/04/17 1319 10/04/17 1445 10/04/17 1515 10/04/17 1530  BP:  (!) 95/53 (!) 87/54 (!) 89/55  Pulse:  64 67 69  Resp:  11 20 15   Temp:      TempSrc:      SpO2:  97% 97% 95%  Weight: 173 lb (78.5 kg)      No intake or output data in the 24 hours ending 10/04/17 1554 Filed Weights   10/04/17 1319  Weight: 173 lb (78.5 kg)   Body mass index is 27.92 kg/m.  General:  Well nourished, well developed, sitting on bedside in no acute distress HEENT: sclera anicteric  Neck: no JVD Vascular: No carotid bruits; distal pulses 2+ bilaterally Cardiac:  normal S1, S2; RRR; no murmurs, gallops, or rubs Lungs:  Diffuse rhonchi with scattered wheezing; no rales Abd: NABS, soft, nontender, no hepatomegaly Ext: no edema Musculoskeletal:  No deformities, BUE and BLE strength normal and equal Skin: warm and dry  Neuro:  CNs 2-12 intact, no focal abnormalities noted Psych:  Normal affect   EKG:  The EKG was personally reviewed and demonstrates: sinus rhythm with 1st degree AV block, submm STE in inferior leads (seen on previous), otherwise no STE/D. Telemetry:  Telemetry was personally reviewed and demonstrates:  NSR  Relevant CV Studies:  Echocardiogram 2016: Study Conclusions  - Left  ventricle: The cavity size was normal. Wall thickness was   increased in a pattern of moderate LVH. Systolic function was   normal. The estimated ejection fraction was in the range of 50%   to 55%. Wall motion was normal; there were no regional wall   motion abnormalities. - Mitral valve: Moderately calcified annulus. There was mild   regurgitation. - Left atrium: The atrium was moderately dilated.  NST 2015: IMPRESSION: Exercise Capacity: [name]  BP Response: normal  Clinical Symptoms:  No symptoms  ECG Impression:  No lexiscan EKG changes  Comparison with Prior Nuclear Study: none  Final Impression:  Normal perfusion.  LVEF 60% - normal wall motion.  [place] 2012: RESULTS: 1. Hemodynamic monitoring:  His central aortic pressure was 193/59. His left ventricular pressure was 87/5 with no significant gradient noted at the time of pullback.  The left ventricular end-diastolic pressure was 13. 2. Ventriculography:  Ventriculography in the RAO projection revealed     normal LV systolic function.  There was marked ventricular ectopy     during the ventriculogram, but there were no wall motion     abnormalities and his ejection fraction appeared to be in excess of     55%. 3. Coronary arteriography:  On fluoroscopy, I did not appreciate any     calcification.     a.     Left main normal.  It trifurcated.  b.     Circumflex.  The circumflex was small and gave rise to a      small first OM.  This system was free of disease. 4. Ramus:  The ramus was a medium-sized vessel, free of disease. 5. LAD.  The LAD had minor irregularities in the proximal portion of     20-30%.  The diagonal 1 and diagonal 2 were free of disease and the mid and distal LAD was free of disease. 6. Right coronary artery.  There was minor proximal irregularities.     The RCA was large and long, it gave rise to a PDA and several     posterior lateral vessels, all of which were free of  disease.  CONCLUSIONS: 1. Normal LV systolic function. 2. No occlusive coronary artery disease. 3. Paroxysmal atrial fibrillation, currently in a sinus rhythm, rate     of 92 with relative low blood pressure.  Laboratory Data:  Chemistry Recent Labs  Lab 10/04/17 1325  NA 137  K 4.4  CL 104  CO2 22  GLUCOSE 108*  BUN 12  CREATININE 0.88  CALCIUM 8.6*  GFRNONAA >60  GFRAA >60  ANIONGAP 11    No results for input(s): PROT, ALBUMIN, AST, ALT, ALKPHOS, BILITOT in the last 168 hours. Hematology Recent Labs  Lab 10/04/17 1325  WBC 5.8  RBC 3.73*  HGB 11.3*  HCT 34.9*  MCV 93.6  MCH 30.3  MCHC 32.4  RDW 13.8  PLT 251   Cardiac EnzymesNo results for input(s): TROPONINI in the last 168 hours.  Recent Labs  Lab 10/04/17 1335  TROPIPOC 0.01    BNPNo results for input(s): BNP, PROBNP in the last 168 hours.  DDimer No results for input(s): DDIMER in the last 168 hours.  Radiology/Studies:  Dg Chest 2 View  Result Date: 10/04/2017 CLINICAL DATA:  Intermittent chest pressure radiating to the right shoulder and arm. EXAM: CHEST - 2 VIEW COMPARISON:  Chest x-ray dated September 26, 2017. FINDINGS: The heart size and mediastinal contours are within normal limits. Normal pulmonary vascularity. No focal consolidation, pleural effusion, or pneumothorax. No acute osseous abnormality. IMPRESSION: No active cardiopulmonary disease. Electronically Signed   By: Titus Dubin M.D.   On: 10/04/2017 14:24    Assessment and Plan:   1. Chest pain/presyncope in patient with non-obstructive CAD: patient reports intermittent chest pressure with associated SOB and radiation of pain to right arm. He has also reported feeling pre-syncopal this morning. His symptoms resolved after receiving SL nitro x1 prior to arrival. Last ischemic evaluation was a NST 2015 which was negative for ischemia. Last LHC in 2012 with non-obstructive disease detailed above. Trop is negative x1. EKG without ischemic  changes. No significant arrhythmias noted on telemetry. His symptoms are somewhat atypical for ACS, however he does have some risk factors with known CAD, HLD, HTN, and ongoing tobacco abuse. - Continue to trend trop x3 or to peak  - Check an echocardiogram to assess LV function - Will plan for cardiac CTA in AM if troponin's remain negative. Keep NPO after MN  2. COPD: patient denies being told he has COPD and he is not prescribed any maintenance medications. Do not see PFTs in our system. That being said, with his significant tobacco abuse history, as well as frequent bronchitis/PNA, suspect he does have COPD. Also likely this is contributing to his chest pressure and SOB complaints.  - Would likely benefit from initiation of maintenance inhalers - will defer to medicine -  Will order nebulizers at this time given rhonchi/wheezing on exam.   3. Atrial fibrillation/flutter: maintaining sinus rhythm on flecainide. His CHA2DS2Vasc Score is 6 (CHF, HTN, DM, Vasc, Age >75), however despite this, he is not on anticoagulation due to GI bleed history and recurrent anemia. - Continue high dose flecainide - Continue home metoprolol and diltiazem   4. Chronic diastolic CHF: Last echo in 2016 with EF 50-55%. CXR with clear lungs. He appears euvolemic on exam - Continue to monitor volume status closely  5. HTN: BP soft, likely in the setting of receiving SL nitro prior to arrival. Improved at the time of my eval - Continue home metoprolol and diltiazem   6. HLD: no recent lipids - Continue pravastatin  7. DM type 2: last A1C 5.7 04/2017; at goal of <7 - Continue management per primary team     For questions or updates, please contact [place] Please consult [email or web address] for contact info under Cardiology/STEMI.   Signed, Abigail Butts, PA-C  10/04/2017 3:54 PM [phone]  Attending Note:   The patient was seen and examined.  Agree with assessment and plan as noted above.  Changes  made to the above note as needed.  Patient seen and independently examined with Roby Lofts, PA .   We discussed all aspects of the encounter. I agree with the assessment and plan as stated above.  1.  Chest pain: The patient has had chest pain for about a year.  It seems to be worsening over the past several weeks.  He has significant COPD and wheezing.  Heart catheterization several years ago revealed minor coronary artery irregularities.  His initial troponin is negative.  His EKG is without significant changes.  He is had ongoing cigarette smoking. We will schedule him for a coronary CT angiogram.  2.  Chronic diastolic congestive heart failure: He appears to be fairly stable at this point.  3.  History of atrial for ablation/atrial flutter: Continue flecainide.  He is not on anticoagulation because of a significant history of GI bleed and recurrent anemia.   I have spent a total of 40 minutes with patient reviewing hospital  notes , telemetry, EKGs, labs and examining patient as well as establishing an assessment and plan that was discussed with the patient. > 50% of time was spent in direct patient care.    Thayer Headings, Brooke Bonito., MD, [facility] 10/04/2017, 5:58 PM [address],  [place] Pager [phone]

## 2017-10-05 ENCOUNTER — Observation Stay (HOSPITAL_BASED_OUTPATIENT_CLINIC_OR_DEPARTMENT_OTHER): Payer: Medicare Other

## 2017-10-05 ENCOUNTER — Observation Stay (HOSPITAL_COMMUNITY): Payer: Medicare Other

## 2017-10-05 ENCOUNTER — Ambulatory Visit (HOSPITAL_COMMUNITY)
Admission: EM | Disposition: A | Discharge status: Home / Self Care | Payer: Self-pay | Source: Home / Self Care | Attending: Emergency Medicine

## 2017-10-05 DIAGNOSIS — R0609 Other forms of dyspnea: Secondary | ICD-10-CM | POA: Diagnosis not present

## 2017-10-05 DIAGNOSIS — I4891 Unspecified atrial fibrillation: Secondary | ICD-10-CM

## 2017-10-05 DIAGNOSIS — I25119 Atherosclerotic heart disease of native coronary artery with unspecified angina pectoris: Secondary | ICD-10-CM | POA: Diagnosis not present

## 2017-10-05 DIAGNOSIS — Z8711 Personal history of peptic ulcer disease: Secondary | ICD-10-CM | POA: Diagnosis not present

## 2017-10-05 DIAGNOSIS — I2510 Atherosclerotic heart disease of native coronary artery without angina pectoris: Secondary | ICD-10-CM

## 2017-10-05 DIAGNOSIS — I340 Nonrheumatic mitral (valve) insufficiency: Secondary | ICD-10-CM

## 2017-10-05 DIAGNOSIS — R0602 Shortness of breath: Secondary | ICD-10-CM | POA: Diagnosis not present

## 2017-10-05 DIAGNOSIS — R2681 Unsteadiness on feet: Secondary | ICD-10-CM | POA: Diagnosis not present

## 2017-10-05 DIAGNOSIS — M4722 Other spondylosis with radiculopathy, cervical region: Secondary | ICD-10-CM | POA: Diagnosis not present

## 2017-10-05 DIAGNOSIS — R109 Unspecified abdominal pain: Secondary | ICD-10-CM | POA: Diagnosis not present

## 2017-10-05 DIAGNOSIS — E119 Type 2 diabetes mellitus without complications: Secondary | ICD-10-CM | POA: Diagnosis not present

## 2017-10-05 DIAGNOSIS — J449 Chronic obstructive pulmonary disease, unspecified: Secondary | ICD-10-CM | POA: Diagnosis not present

## 2017-10-05 DIAGNOSIS — R079 Chest pain, unspecified: Secondary | ICD-10-CM | POA: Diagnosis not present

## 2017-10-05 DIAGNOSIS — I110 Hypertensive heart disease with heart failure: Secondary | ICD-10-CM | POA: Diagnosis not present

## 2017-10-05 DIAGNOSIS — I5032 Chronic diastolic (congestive) heart failure: Secondary | ICD-10-CM | POA: Diagnosis not present

## 2017-10-05 DIAGNOSIS — M1990 Unspecified osteoarthritis, unspecified site: Secondary | ICD-10-CM | POA: Diagnosis not present

## 2017-10-05 DIAGNOSIS — I10 Essential (primary) hypertension: Secondary | ICD-10-CM | POA: Diagnosis not present

## 2017-10-05 DIAGNOSIS — D649 Anemia, unspecified: Secondary | ICD-10-CM | POA: Diagnosis not present

## 2017-10-05 DIAGNOSIS — I480 Paroxysmal atrial fibrillation: Secondary | ICD-10-CM | POA: Diagnosis not present

## 2017-10-05 HISTORY — PX: LEFT HEART CATH AND CORONARY ANGIOGRAPHY: CATH118249

## 2017-10-05 LAB — TROPONIN I

## 2017-10-05 LAB — GLUCOSE, CAPILLARY
GLUCOSE-CAPILLARY: 132 mg/dL — AB (ref 70–99)
GLUCOSE-CAPILLARY: 139 mg/dL — AB (ref 70–99)
Glucose-Capillary: 129 mg/dL — ABNORMAL HIGH (ref 70–99)
Glucose-Capillary: 192 mg/dL — ABNORMAL HIGH (ref 70–99)

## 2017-10-05 LAB — CREATININE, SERUM
Creatinine, Ser: 1.03 mg/dL (ref 0.61–1.24)
GFR calc non Af Amer: >60 mL/min (ref >60)

## 2017-10-05 LAB — BASIC METABOLIC PANEL
Anion gap: 11 (ref 5–15)
BUN: 16 mg/dL (ref 8–23)
CHLORIDE: 105 mmol/L (ref 98–111)
CO2: 24 mmol/L (ref 22–32)
Calcium: 8.3 mg/dL — ABNORMAL LOW (ref 8.9–10.3)
Creatinine, Ser: 0.91 mg/dL (ref 0.61–1.24)
Glucose, Bld: 136 mg/dL — ABNORMAL HIGH (ref 70–99)
POTASSIUM: 4.1 mmol/L (ref 3.5–5.1)
SODIUM: 140 mmol/L (ref 135–145)

## 2017-10-05 LAB — CBC
HCT: 37.7 % — ABNORMAL LOW (ref 39.0–52.0)
Hemoglobin: 12.1 g/dL — ABNORMAL LOW (ref 13.0–17.0)
MCH: 29.7 pg (ref 26.0–34.0)
MCHC: 32.1 g/dL (ref 30.0–36.0)
MCV: 92.6 fL (ref 78.0–100.0)
PLATELETS: 252 10*3/uL (ref 150–400)
RBC: 4.07 MIL/uL — ABNORMAL LOW (ref 4.22–5.81)
RDW: 13.8 % (ref 11.5–15.5)
WBC: 5.6 10*3/uL (ref 4.0–10.5)

## 2017-10-05 LAB — ECHOCARDIOGRAM COMPLETE
Height: 66 in
Weight: 2697.6 oz

## 2017-10-05 SURGERY — LEFT HEART CATH AND CORONARY ANGIOGRAPHY
Anesthesia: LOCAL

## 2017-10-05 MED ORDER — ADULT MULTIVITAMIN W/MINERALS CH
1.0000 | ORAL_TABLET | Freq: Two times a day (BID) | ORAL | Status: DC
  Administered 2017-10-05 – 2017-10-06 (×3): 1 via ORAL
  Filled 2017-10-05 (×3): qty 1

## 2017-10-05 MED ORDER — MIDAZOLAM HCL 2 MG/2ML IJ SOLN
INTRAMUSCULAR | Status: AC
  Filled 2017-10-05: qty 2

## 2017-10-05 MED ORDER — IOPAMIDOL (ISOVUE-370) INJECTION 76%
INTRAVENOUS | Status: AC
  Filled 2017-10-05: qty 100

## 2017-10-05 MED ORDER — HEPARIN (PORCINE) IN NACL 1000-0.9 UT/500ML-% IV SOLN
INTRAVENOUS | Status: AC
  Filled 2017-10-05: qty 1000

## 2017-10-05 MED ORDER — PANTOPRAZOLE SODIUM 40 MG PO TBEC
40.0000 mg | DELAYED_RELEASE_TABLET | Freq: Two times a day (BID) | ORAL | Status: DC
  Administered 2017-10-05 – 2017-10-06 (×3): 40 mg via ORAL
  Filled 2017-10-05 (×3): qty 1

## 2017-10-05 MED ORDER — SODIUM CHLORIDE 0.9 % WEIGHT BASED INFUSION
1.0000 mL/kg/h | INTRAVENOUS | Status: AC
  Administered 2017-10-05: 1 mL/kg/h via INTRAVENOUS

## 2017-10-05 MED ORDER — NITROGLYCERIN 0.4 MG SL SUBL
0.4000 mg | SUBLINGUAL_TABLET | SUBLINGUAL | Status: DC | PRN
  Administered 2017-10-05: 0.8 mg via SUBLINGUAL

## 2017-10-05 MED ORDER — PRAVASTATIN SODIUM 40 MG PO TABS
40.0000 mg | ORAL_TABLET | Freq: Every evening | ORAL | Status: DC
  Administered 2017-10-05: 40 mg via ORAL
  Filled 2017-10-05: qty 1

## 2017-10-05 MED ORDER — NITROGLYCERIN 0.4 MG SL SUBL
SUBLINGUAL_TABLET | SUBLINGUAL | Status: AC
  Filled 2017-10-05: qty 2

## 2017-10-05 MED ORDER — SODIUM CHLORIDE 0.9 % IV SOLN: 250.0000 mL | INTRAVENOUS | Status: DC | PRN

## 2017-10-05 MED ORDER — SODIUM CHLORIDE 0.9% FLUSH: 3.0000 mL | INTRAVENOUS | Status: DC | PRN

## 2017-10-05 MED ORDER — VERAPAMIL HCL 2.5 MG/ML IV SOLN
INTRAVENOUS | Status: AC
  Filled 2017-10-05: qty 2

## 2017-10-05 MED ORDER — INSULIN GLARGINE 100 UNIT/ML ~~LOC~~ SOLN
8.0000 [IU] | Freq: Two times a day (BID) | SUBCUTANEOUS | Status: DC
  Administered 2017-10-05 – 2017-10-06 (×3): 8 [IU] via SUBCUTANEOUS
  Filled 2017-10-05 (×3): qty 0.08

## 2017-10-05 MED ORDER — ENOXAPARIN SODIUM 40 MG/0.4ML ~~LOC~~ SOLN: 40.0000 mg | SUBCUTANEOUS | Status: DC

## 2017-10-05 MED ORDER — SODIUM CHLORIDE 0.9% FLUSH: 3.0000 mL | Freq: Two times a day (BID) | INTRAVENOUS | Status: DC

## 2017-10-05 MED ORDER — POLYETHYLENE GLYCOL 3350 17 G PO PACK
17.0000 g | PACK | Freq: Two times a day (BID) | ORAL | Status: DC
  Administered 2017-10-06: 17 g via ORAL
  Filled 2017-10-05 (×2): qty 1

## 2017-10-05 MED ORDER — LIDOCAINE HCL (PF) 1 % IJ SOLN
INTRAMUSCULAR | Status: AC
  Filled 2017-10-05: qty 30

## 2017-10-05 MED ORDER — IOPAMIDOL (ISOVUE-370) INJECTION 76%
100.0000 mL | Freq: Once | INTRAVENOUS | Status: AC | PRN
  Administered 2017-10-05: 80 mL via INTRAVENOUS

## 2017-10-05 MED ORDER — IOPAMIDOL (ISOVUE-370) INJECTION 76%
INTRAVENOUS | Status: DC | PRN
  Administered 2017-10-05: 50 mL via INTRA_ARTERIAL

## 2017-10-05 MED ORDER — OXYBUTYNIN CHLORIDE ER 5 MG PO TB24
5.0000 mg | ORAL_TABLET | Freq: Every day | ORAL | Status: DC
  Administered 2017-10-05 – 2017-10-06 (×2): 5 mg via ORAL
  Filled 2017-10-05 (×2): qty 1

## 2017-10-05 MED ORDER — SODIUM CHLORIDE 0.9 % IV SOLN
INTRAVENOUS | Status: AC | PRN
  Administered 2017-10-05: 10 mL/h via INTRAVENOUS

## 2017-10-05 MED ORDER — ASPIRIN 81 MG PO CHEW: 81.0000 mg | CHEWABLE_TABLET | ORAL | Status: DC

## 2017-10-05 MED ORDER — METOPROLOL SUCCINATE ER 25 MG PO TB24
12.5000 mg | ORAL_TABLET | Freq: Every day | ORAL | Status: DC
  Administered 2017-10-05: 12.5 mg via ORAL
  Filled 2017-10-05 (×2): qty 1

## 2017-10-05 MED ORDER — HEPARIN SODIUM (PORCINE) 1000 UNIT/ML IJ SOLN
INTRAMUSCULAR | Status: AC
  Filled 2017-10-05: qty 1

## 2017-10-05 MED ORDER — INSULIN ASPART 100 UNIT/ML ~~LOC~~ SOLN
0.0000 [IU] | Freq: Three times a day (TID) | SUBCUTANEOUS | Status: DC
  Administered 2017-10-05 – 2017-10-06 (×2): 2 [IU] via SUBCUTANEOUS

## 2017-10-05 MED ORDER — HEPARIN SODIUM (PORCINE) 1000 UNIT/ML IJ SOLN
INTRAMUSCULAR | Status: DC | PRN
  Administered 2017-10-05: 4000 [IU] via INTRAVENOUS

## 2017-10-05 MED ORDER — HYDROCODONE-ACETAMINOPHEN 5-325 MG PO TABS
1.0000 | ORAL_TABLET | Freq: Three times a day (TID) | ORAL | Status: DC | PRN
  Administered 2017-10-05 – 2017-10-06 (×2): 1 via ORAL
  Filled 2017-10-05 (×2): qty 1

## 2017-10-05 MED ORDER — FLECAINIDE ACETATE 50 MG PO TABS: 225.0000 mg | ORAL_TABLET | Freq: Two times a day (BID) | ORAL | Status: DC

## 2017-10-05 MED ORDER — SODIUM CHLORIDE 0.9 % WEIGHT BASED INFUSION: 3.0000 mL/kg/h | INTRAVENOUS | Status: DC

## 2017-10-05 MED ORDER — DILTIAZEM HCL ER COATED BEADS 120 MG PO CP24
120.0000 mg | ORAL_CAPSULE | Freq: Every day | ORAL | Status: DC
  Administered 2017-10-05 – 2017-10-06 (×2): 120 mg via ORAL
  Filled 2017-10-05 (×2): qty 1

## 2017-10-05 MED ORDER — FENTANYL CITRATE (PF) 100 MCG/2ML IJ SOLN
INTRAMUSCULAR | Status: AC
  Filled 2017-10-05: qty 2

## 2017-10-05 MED ORDER — LIDOCAINE HCL (PF) 1 % IJ SOLN
INTRAMUSCULAR | Status: DC | PRN
  Administered 2017-10-05: 2 mL via SUBCUTANEOUS

## 2017-10-05 MED ORDER — SODIUM CHLORIDE 0.9 % WEIGHT BASED INFUSION: 1.0000 mL/kg/h | INTRAVENOUS | Status: DC

## 2017-10-05 MED ORDER — IPRATROPIUM-ALBUTEROL 0.5-2.5 (3) MG/3ML IN SOLN
3.0000 mL | Freq: Three times a day (TID) | RESPIRATORY_TRACT | Status: DC
  Administered 2017-10-05 – 2017-10-06 (×2): 3 mL via RESPIRATORY_TRACT
  Filled 2017-10-05 (×2): qty 3

## 2017-10-05 MED ORDER — MIDAZOLAM HCL 2 MG/2ML IJ SOLN
INTRAMUSCULAR | Status: DC | PRN
  Administered 2017-10-05: 1 mg via INTRAVENOUS

## 2017-10-05 MED ORDER — VERAPAMIL HCL 2.5 MG/ML IV SOLN
INTRAVENOUS | Status: DC | PRN
  Administered 2017-10-05: 10 mL via INTRA_ARTERIAL

## 2017-10-05 MED ORDER — COENZYME Q10 30 MG PO CAPS: 30.0000 mg | ORAL_CAPSULE | Freq: Every day | ORAL | Status: DC

## 2017-10-05 MED ORDER — LORATADINE 10 MG PO TABS
10.0000 mg | ORAL_TABLET | Freq: Every evening | ORAL | Status: DC
  Administered 2017-10-05: 10 mg via ORAL
  Filled 2017-10-05: qty 1

## 2017-10-05 MED ORDER — FENTANYL CITRATE (PF) 100 MCG/2ML IJ SOLN
INTRAMUSCULAR | Status: DC | PRN
  Administered 2017-10-05: 25 ug via INTRAVENOUS

## 2017-10-05 MED ORDER — TAMSULOSIN HCL 0.4 MG PO CAPS
0.4000 mg | ORAL_CAPSULE | Freq: Every day | ORAL | Status: DC
  Administered 2017-10-05 – 2017-10-06 (×2): 0.4 mg via ORAL
  Filled 2017-10-05 (×2): qty 1

## 2017-10-05 MED ORDER — SODIUM CHLORIDE 0.9% FLUSH
3.0000 mL | Freq: Two times a day (BID) | INTRAVENOUS | Status: DC
  Administered 2017-10-06: 3 mL via INTRAVENOUS

## 2017-10-05 SURGICAL SUPPLY — 9 items
CATH 5FR JL3.5 JR4 ANG PIG MP (CATHETERS) ×2 IMPLANT
DEVICE RAD COMP TR BAND LRG (VASCULAR PRODUCTS) ×2 IMPLANT
GLIDESHEATH SLEND SS 6F .021 (SHEATH) ×2 IMPLANT
GUIDEWIRE INQWIRE 1.5J.035X260 (WIRE) ×1 IMPLANT
INQWIRE 1.5J .035X260CM (WIRE) ×2
KIT HEART LEFT (KITS) ×2 IMPLANT
PACK CARDIAC CATHETERIZATION (CUSTOM PROCEDURE TRAY) ×2 IMPLANT
TRANSDUCER W/STOPCOCK (MISCELLANEOUS) ×2 IMPLANT
TUBING CIL FLEX 10 FLL-RA (TUBING) ×2 IMPLANT

## 2017-10-05 NOTE — ED Provider Notes (Signed)
[facility] PROGRESSIVE CARE Provider Note   CSN: [id number] Arrival date & time: 10/04/17  1310     History   Chief Complaint Chief Complaint  Patient presents with  . Chest Pain    HPI [name] is a 77 y.o. male.  77 year old male with prior history as detailed below presents with complaint of chest pain.  Patient presented chest discomfort over the last 24 to 36 hours.  Patient's pain comes and goes.  He is currently pain-free.  Patient reports right-sided chest discomfort.  Patient denies associated fever.  The patient is reporting associated mild shortness of breath.  EMS brought the patient to the ED and give the patient aspirin and sublingual nitro.  The history is provided by the patient and medical records.  Chest Pain   This is a recurrent problem. The current episode started yesterday. The problem occurs rarely. The problem has been resolved. The pain is present in the substernal region and lateral region. The pain is mild. The quality of the pain is described as pressure-like. The pain does not radiate. Associated symptoms include shortness of breath. He has tried nitroglycerin for the symptoms.    Past Medical History:  Diagnosis Date  . Anemia    takes Ferrous Sulfate daily  . Arthritis    "all over"  . Balance problem 01/2014  . CAP (community acquired pneumonia) 09/18/2014  . Cervical radiculopathy due to degenerative joint disease of spine   . COPD (chronic obstructive pulmonary disease) ([place])   . Coronary artery disease, non-occlusive    a. 03/2010 Nonocclusive disease by cath, performed for ST elevations on ECG;  b. 06/2013 Lexi MV: EF 60%, no ischemia.  . Diabetes mellitus type II    takes Metformin and Lantus daily  . Diastolic CHF, chronic ([place])    a. 12/2012 EF 55-60%, diast dysfxn, triv MR, mildly dil LA/RA.  [name] History of blood transfusion 1982   "when I had stomach OR"  . History of bronchitis    1998  . History of gastric ulcer   . HTN  (hypertension)    takes Diltiazem daily  . Hyperlipidemia    takes Pravastatin daily  . Joint pain   . PAF (paroxysmal atrial fibrillation) (HCC)    Recurrent after atrial flutter (a. 07/2010 Status post caval tricuspid isthmus ablation by Dr. Midge Aver Metoprolol daily), currently controlled on flecainide plus diltiazem and Coumadin  . Weakness    numbness and tingling both hands    Patient Active Problem List   Diagnosis Date Noted  . Chest pain 10/04/2017  . HTN (hypertension) 05/21/2017  . Diabetes mellitus type II 05/21/2017  . Diastolic CHF, chronic ([place]) 05/21/2017  . COPD (chronic obstructive pulmonary disease) ([place]) 05/21/2017  . H/O atrial flutter 05/21/2017  . Coronary disease/nonobstructive 05/21/2017  . Acute blood loss anemia 10/02/2016  . Abdominal pain 10/02/2016  . Constipation 10/02/2016  . Left lower quadrant pain   . Coagulopathy ([place])   . Heme positive stool   . Symptomatic anemia 07/10/2016  . Syncope 05/15/2015  . Cervical disc disease 05/15/2015  . Abnormal urinalysis 05/15/2015  . Anemia 05/15/2015  . History of CVA (cerebrovascular accident) 04/03/2014  . Near syncope 02/15/2014  . Pre-syncope 02/15/2014  . Benign neoplasm of rectum and anal canal 12/02/2013  . Upper GI bleeding 11/30/2013  . Tobacco use disorder 11/30/2013  . Anticoagulation goal of INR 2 to 3, for PAF - CHA2DS2Vasc = 7 08/15/2013  . Type 2 diabetes mellitus (  [place]) 01/22/2013  . Tobacco abuse counseling 12/15/2012  . Hyperlipidemia   . Obesity (BMI 30-39.9) 12/14/2012  . Atrial flutter - status post CTI ablation 07/29/2010  . Essential hypertension 07/29/2010  . Chronic diastolic congestive heart failure ([place]) 07/29/2010  . Coronary artery disease, non-occlusive 07/29/2010    Past Surgical History:  Procedure Laterality Date  . ATRIAL ABLATION SURGERY  08/05/10   CTI ablation for atrial flutter by JA  . CARDIAC CATHETERIZATION  2012   nl LV function, no occlusive CAD,  PAF  . CARDIOVERSION  12/07/2010    Successful direct current cardioversion with atrial fibrillation to normal sinus rhythm  . CARPAL TUNNEL RELEASE Bilateral 01/30/2014   Procedure: BILATERAL CARPAL TUNNEL RELEASE;  Surgeon: Marianna Payment, MD;  Location: [facility];  Service: Orthopedics;  Laterality: Bilateral;  . CATARACT EXTRACTION W/ INTRAOCULAR LENS  IMPLANT, BILATERAL Bilateral   . COLONOSCOPY [name] 12/02/2013   Procedure: COLONOSCOPY;  Surgeon: Irene Shipper, MD;  Location: [facility];  Service: Endoscopy;  Laterality: [name];  . ESOPHAGOGASTRODUODENOSCOPY [name] 09/22/2014   Procedure: ESOPHAGOGASTRODUODENOSCOPY (EGD);  Surgeon: Ronald Lobo, MD;  Location: [facility] ENDOSCOPY;  Service: Endoscopy;  Laterality: [name];  . ESOPHAGOGASTRODUODENOSCOPY [name] 07/11/2016   Procedure: ESOPHAGOGASTRODUODENOSCOPY (EGD);  Surgeon: Doran Stabler, MD;  Location: [facility] ENDOSCOPY;  Service: Endoscopy;  Laterality: [name];  . INCISION AND DRAINAGE ABSCESS / HEMATOMA OF BURSA / KNEE / THIGH Left 1998   knee  . KNEE BURSECTOMY Left 1998  . LAPAROSCOPIC CHOLECYSTECTOMY  03/2010  . NM MYOVIEW LTD  07/22/2013   Normal EF ~60%, no ischemia or infarction.  [name] PARTIAL GASTRECTOMY  1982   subtotal; "took out 30% for ulcers"  . TRANSTHORACIC ECHOCARDIOGRAM  02/16/2014   EF 60%, no RWMA. - otherwise normal  . YAG LASER APPLICATION Bilateral         Home Medications    Prior to Admission medications   Medication Sig Start Date End Date Taking? Authorizing Provider  aspirin EC 81 MG tablet Take 81 mg by mouth daily.   Yes [provider]  co-enzyme Q-10 30 MG capsule Take 30 mg by mouth daily.    Yes [provider]  diltiazem (CARDIZEM CD) 120 MG 24 hr capsule Take 1 capsule (120 mg total) by mouth daily. 05/04/17  Yes Leonie Man, MD  flecainide (TAMBOCOR) 150 MG tablet Take 1.5 tablets (225 mg total) by mouth 2 (two) times daily. 05/04/17  Yes Leonie Man, MD  HYDROcodone-acetaminophen  (NORCO/VICODIN) 5-325 MG tablet Take 1 tablet by mouth every 8 (eight) hours as needed for moderate pain.   Yes [provider]  hydrocortisone cream 1 % Apply 1 application topically 2 (two) times daily.  01/02/17  Yes [provider]  Insulin Glargine (LANTUS) 100 UNIT/ML Solostar Pen Inject 8 Units into the skin 2 (two) times daily. 11/30/16  Yes Domenic Polite, MD  loratadine (CLARITIN) 10 MG tablet Take 10 mg by mouth every evening.    Yes [provider]  metFORMIN (GLUCOPHAGE) 1000 MG tablet Take 1,000 mg by mouth 2 (two) times daily with a meal.    Yes [provider]  metoprolol succinate (TOPROL-XL) 25 MG 24 hr tablet TAKE 1/2 TABLET  (12.5 MG TOTAL) BY MOUTH DAILY. 05/04/17  Yes Leonie Man, MD  Multiple Vitamin (MULTIVITAMIN WITH MINERALS) TABS tablet Take 1 tablet by mouth 2 (two) times daily.    Yes [provider]  nitroGLYCERIN (NITROSTAT) 0.4 MG SL tablet Place  1 tablet (0.4 mg total) under the tongue every 5 (five) minutes x 3 doses as needed for chest pain. 11/19/15  Yes Kilroy, Luke K, PA-C  oxybutynin (DITROPAN-XL) 5 MG 24 hr tablet Take 5 mg by mouth daily. 11/02/16 11/02/17 Yes [provider]  pantoprazole (PROTONIX) 40 MG tablet Take 1 tablet (40 mg total) by mouth 2 (two) times daily before a meal. 11/30/16  Yes Domenic Polite, MD  polyethylene glycol (MIRALAX / GLYCOLAX) packet Take 17 g by mouth 2 (two) times daily. Patient taking differently: Take 17 g by mouth daily as needed for moderate constipation.  10/03/16  Yes Sheikh, Omair Latif, DO  pravastatin (PRAVACHOL) 40 MG tablet Take 1 tablet (40 mg total) by mouth every evening. 05/04/17  Yes Leonie Man, MD  tamsulosin (FLOMAX) 0.4 MG CAPS capsule Take 0.4 mg by mouth daily. 02/20/17  Yes [provider]    Family History Family History  Problem Relation Age of Onset  . Cancer Mother   . Heart attack Father   . Heart attack Brother     Social  History Social History   Tobacco Use  . Smoking status: Current Every Day Smoker    Packs/day: 0.50    Years: 57.00    Pack years: 28.50    Types: Cigarettes  . Smokeless tobacco: Never Used  . Tobacco comment: He's been smoking between 0.5 and 2 ppd since age 48.  Substance Use Topics  . Alcohol use: No    Alcohol/week: 0.0 oz    Comment: "quit drinking in 1986"  . Drug use: No     Allergies   Cyclobenzaprine   Review of Systems Review of Systems  Respiratory: Positive for shortness of breath.   Cardiovascular: Positive for chest pain.  All other systems reviewed and are negative.    Physical Exam Updated Vital Signs BP 110/63 (BP Location: Left Arm)   Pulse (!) 53   Temp 98 F (36.7 C) (Oral)   Resp 20   Ht 5\' 6"  (1.676 m)   Wt 77 kg (169 lb 12.8 oz)   SpO2 99%   BMI 27.41 kg/m   Physical Exam  Constitutional: He is oriented to person, place, and time. He appears well-developed and well-nourished. No distress.  HENT:  Head: Normocephalic and atraumatic.  Mouth/Throat: Oropharynx is clear and moist.  Eyes: Pupils are equal, round, and reactive to light. Conjunctivae and EOM are normal.  Neck: Normal range of motion. Neck supple.  Cardiovascular: Normal rate, regular rhythm and normal heart sounds.  Pulmonary/Chest: Effort normal and breath sounds normal. No respiratory distress.  Abdominal: Soft. He exhibits no distension. There is no tenderness.  Musculoskeletal: Normal range of motion. He exhibits no edema or deformity.  Neurological: He is alert and oriented to person, place, and time.  Skin: Skin is warm and dry.  Psychiatric: He has a normal mood and affect.  Nursing note and vitals reviewed.    ED Treatments / Results  Labs (all labs ordered are listed, but only abnormal results are displayed) Labs Reviewed  BASIC METABOLIC PANEL - Abnormal; Notable for the following components:      Result Value   Glucose, Bld 108 (*)    Calcium 8.6 (*)     All other components within normal limits  CBC - Abnormal; Notable for the following components:   RBC 3.73 (*)    Hemoglobin 11.3 (*)    HCT 34.9 (*)    All other components within normal  limits  CBC - Abnormal; Notable for the following components:   RBC 3.92 (*)    Hemoglobin 11.9 (*)    HCT 37.1 (*)    All other components within normal limits  GLUCOSE, CAPILLARY - Abnormal; Notable for the following components:   Glucose-Capillary 110 (*)    All other components within normal limits  GLUCOSE, CAPILLARY - Abnormal; Notable for the following components:   Glucose-Capillary 162 (*)    All other components within normal limits  CREATININE, SERUM  TROPONIN I  TROPONIN I  TROPONIN I  I-STAT TROPONIN, ED    EKG EKG Interpretation  Date/Time:  Thursday October 04 2017 14:40:56 EDT Ventricular Rate:  68 PR Interval:    QRS Duration: 89 QT Interval:  408 QTC Calculation: 434 R Axis:   -33 Text Interpretation:  Sinus or ectopic atrial rhythm Prolonged PR interval Left axis deviation ST elevation, consider inferior injury Confirmed by Dene Gentry [id number]) on 10/04/2017 2:49:22 PM   Radiology Dg Chest 2 View  Result Date: 10/04/2017 CLINICAL DATA:  Intermittent chest pressure radiating to the right shoulder and arm. EXAM: CHEST - 2 VIEW COMPARISON:  Chest x-ray dated September 26, 2017. FINDINGS: The heart size and mediastinal contours are within normal limits. Normal pulmonary vascularity. No focal consolidation, pleural effusion, or pneumothorax. No acute osseous abnormality. IMPRESSION: No active cardiopulmonary disease. Electronically Signed   By: Titus Dubin M.D.   On: 10/04/2017 14:24    Procedures Procedures (including critical care time)  Medications Ordered in ED Medications  acetaminophen (TYLENOL) tablet 650 mg (has no administration in time range)  ondansetron (ZOFRAN) injection 4 mg (has no administration in time range)  enoxaparin (LOVENOX) injection 30 mg (30 mg  Subcutaneous Given 10/04/17 1802)  morphine 4 MG/ML injection 2 mg (2 mg Intravenous Given 10/05/17 0531)  gi cocktail (Maalox,Lidocaine,Donnatal) (has no administration in time range)  aspirin EC tablet 325 mg (325 mg Oral Not Given 10/04/17 1658)  ipratropium-albuterol (DUONEB) 0.5-2.5 (3) MG/3ML nebulizer solution 3 mL (3 mLs Nebulization Given 10/05/17 0120)  metoprolol succinate (TOPROL-XL) 24 hr tablet 25 mg (25 mg Oral Given 10/04/17 2136)  diltiazem (CARDIZEM CD) 24 hr capsule 120 mg (has no administration in time range)  metoprolol tartrate (LOPRESSOR) tablet 50 mg (has no administration in time range)  flecainide (TAMBOCOR) tablet 225 mg (225 mg Oral Given 10/04/17 2136)     Initial Impression / Assessment and Plan / ED Course  I have reviewed the triage vital signs and the nursing notes.  Pertinent labs & imaging results that were available during my care of the patient were reviewed by me and considered in my medical decision making (see chart for details).     MDM  Screen complete  Patient is presenting for evaluation of chest pain.  Patient is pain-free upon arrival to the ED.  His initial EKG is without significant change from prior.  Troponin is negative.  Other screening labs are without significant abnormality.  However, given patient's comorbid conditions and prior history will admit for further work-up.  Hospitalist service is aware of case will evaluate the patient for admission.  Final Clinical Impressions(s) / ED Diagnoses   Final diagnoses:  Chest pain, unspecified type    ED Discharge Orders    None       Valarie Merino, MD 10/05/17 [phone]

## 2017-10-05 NOTE — H&P (View-Only) (Signed)
Progress Note  Patient Name: [name] Date of Encounter: 10/05/2017  Primary Cardiologist: Glenetta Hew, MD   Subjective   77 year old gentleman with a history of nonobstructive coronary artery disease, atrial fibrillation-status post ablation, now on flecainide.  He is not on anticoagulation due to history of GI bleeding and anemia.  He is admitted with some episodes of chest discomfort.  His troponin levels have remained negative.  He is scheduled for a coronary CT angiogram today.    Inpatient Medications    Scheduled Meds: . aspirin EC  325 mg Oral Daily  . diltiazem  120 mg Oral Daily  . enoxaparin (LOVENOX) injection  30 mg Subcutaneous Q24H  . flecainide  225 mg Oral Q12H  . insulin aspart  0-15 Units Subcutaneous TID WC  . insulin glargine  8 Units Subcutaneous BID  . ipratropium-albuterol  3 mL Nebulization Q6H  . loratadine  10 mg Oral QPM  . metoprolol succinate  12.5 mg Oral Daily  . metoprolol tartrate  50 mg Oral Once  . multivitamin with minerals  1 tablet Oral BID  . oxybutynin  5 mg Oral Daily  . pantoprazole  40 mg Oral BID AC  . polyethylene glycol  17 g Oral BID  . pravastatin  40 mg Oral QPM  . tamsulosin  0.4 mg Oral Daily   Continuous Infusions:  PRN Meds: acetaminophen, gi cocktail, HYDROcodone-acetaminophen, morphine injection, nitroGLYCERIN, ondansetron (ZOFRAN) IV   Vital Signs    Vitals:   10/04/17 2017 10/04/17 2108 10/04/17 2359 10/05/17 0515  BP:  117/65 (!) 108/53 110/63  Pulse:  72 (!) 56 (!) 53  Resp:  20 20 20   Temp:  98 F (36.7 C) 97.8 F (36.6 C) 98 F (36.7 C)  TempSrc:  Oral Oral Oral  SpO2: 97% 98% 95% 99%  Weight:    168 lb 9.6 oz (76.5 kg)  Height:        Intake/Output Summary (Last 24 hours) at 10/05/2017 0754 Last data filed at 10/04/2017 2140 Gross per 24 hour  Intake 480 ml  Output -  Net 480 ml   Filed Weights   10/04/17 1319 10/04/17 1623 10/05/17 0515  Weight: 173 lb (78.5 kg) 169 lb 12.8 oz (77  kg) 168 lb 9.6 oz (76.5 kg)    Telemetry    NSR with 1st degree AV block  - Personally Reviewed  ECG     NSR  - Personally Reviewed  Physical Exam     GEN:  Elderly gentleman, no acute distress. Neck: No JVD Cardiac:  Regular rate. Respiratory:  Lungs are much better today.  No significant wheezes. GI: Soft, nontender, non-distended  MS: No edema; No deformity. Neuro:  Nonfocal  Psych: Normal affect   Labs    Chemistry Recent Labs  Lab 10/04/17 1325 10/04/17 1549  NA 137  --   K 4.4  --   CL 104  --   CO2 22  --   GLUCOSE 108*  --   BUN 12  --   CREATININE 0.88 0.88  CALCIUM 8.6*  --   GFRNONAA >60 >60  GFRAA >60 >60  ANIONGAP 11  --      Hematology Recent Labs  Lab 10/04/17 1325 10/04/17 1549  WBC 5.8 6.8  RBC 3.73* 3.92*  HGB 11.3* 11.9*  HCT 34.9* 37.1*  MCV 93.6 94.6  MCH 30.3 30.4  MCHC 32.4 32.1  RDW 13.8 13.8  PLT 251 269    Cardiac Enzymes Recent  Labs  Lab 10/05/17 0457  TROPONINI <0.03    Recent Labs  Lab 10/04/17 1335  TROPIPOC 0.01     BNPNo results for input(s): BNP, PROBNP in the last 168 hours.   DDimer No results for input(s): DDIMER in the last 168 hours.   Radiology    Dg Chest 2 View  Result Date: 10/04/2017 CLINICAL DATA:  Intermittent chest pressure radiating to the right shoulder and arm. EXAM: CHEST - 2 VIEW COMPARISON:  Chest x-ray dated September 26, 2017. FINDINGS: The heart size and mediastinal contours are within normal limits. Normal pulmonary vascularity. No focal consolidation, pleural effusion, or pneumothorax. No acute osseous abnormality. IMPRESSION: No active cardiopulmonary disease. Electronically Signed   By: Titus Dubin M.D.   On: 10/04/2017 14:24    Cardiac Studies    Patient Profile      77 year old gentleman admitted with atypical chest pain.  Assessment & Plan    1.  Chest pain: Patient has nonobstructive disease by heart cath several years ago.  He presents with atypical chest pain.   His troponin levels are negative.  He is scheduled for a coronary CT angiogram today. Getting  an echocardiogram today.      For questions or updates, please contact [place] Please consult [email or web address] for contact info under Cardiology/STEMI.      Signed, Mertie Moores, MD  10/05/2017, 7:54 AM

## 2017-10-05 NOTE — Interval H&P Note (Signed)
History and Physical Interval Note:  10/05/2017 2:14 PM  [name]  has presented today for surgery, with the diagnosis of CP  The various methods of treatment have been discussed with the patient and family. After consideration of risks, benefits and other options for treatment, the patient has consented to  Procedure(s): LEFT HEART CATH AND CORONARY ANGIOGRAPHY ([name]) as a surgical intervention .  The patient's history has been reviewed, patient examined, no change in status, stable for surgery.  I have reviewed the patient's chart and labs.  Questions were answered to the patient's satisfaction.   Cath Lab Visit (complete for each Cath Lab visit)  Clinical Evaluation Leading to the Procedure:   ACS: Yes.    Non-ACS:    Anginal Classification: CCS III  Anti-ischemic medical therapy: Maximal Therapy (2 or more classes of medications)  Non-Invasive Test Results: No non-invasive testing performed  Prior CABG: No previous CABG        [name] 10/05/2017 2:14 PM

## 2017-10-05 NOTE — Progress Notes (Signed)
PROGRESS NOTE    [name]  YCX:[id number] DOB: 1940-07-19 DOA: 10/04/2017 PCP: [name], MD  Brief Narrative:[name] is a 77 y.o. male with medical history significant of obstructive coronary disease with last cardiac catheterization in 2012 and a negative nuclear test in 2015, retention, history of atrial flutter status post ablation currently maintaining ectopic atrial rhythm, COPD, diabetes on oral agents and insulin, ongoing tobacco abuse with a history of a Billroth II gastrojejunostomy procedure, chronic intermittent GI blood loss anemia on iron infusions at the cancer center . -presented to the emergency room 7/11 with atypical chest pain, shortness of breath and near syncope 2  Assessment & Plan:   1.  Chest pain -atypical with some typical features, -EKG without acute findings and troponin negative -History of nonobstructive CAD by cath In 2012 and low-risk Myoview in 06/2013 -Cardiology consulting, plan for coronary CTA today  2.  Atrial flutter status post CTI ablation: Currently in ectopic atrial rhythm. -no longer on warfarin due to chronic GI blood loss -continue Toprol, Cardizem, flecainide  3. Left flank pain -UA is unremarkable -Patient reports having a left kidney stone from months to years ago -Last CT abdomen pelvis from May, notable for small right kidney stone only -Check KUB  4.  Essential hypertension:  -stable, Continue home medications.  5.  Chronic diastolic congestive heart failure: -Clinically appears euvolemic, continue beta blocker, not on diuretics  6.  Hyperlipidemia -Stable  7.  Diabetes type 2: -stable, metformin on hold, continue Lantus, SSI  6.  Apparent near syncopal event  -Telemetry with first-degree heart block -adequate holding parameters for low dose Toprol -Monitor -Follow-up echo and coronary CT  8.  chronic iron deficiency anemia -Felt to be due to occult chronic GI blood loss -Followed and at  the cancer center, receives iron transfusions fairly regularly. -hemoglobin fairly stable at this time  9.  COPD: Currently stable and compensated.  Patient reports not smoking recently.  DVT prophylaxis: Lovenox Code Status: Full code Family Communication: no family at bedside Disposition Plan: home pending above workup  Consultants:   cardiology   Procedures:   Antimicrobials:    Subjective: -complains of L flank pain  Objective: Vitals:   10/04/17 2108 10/04/17 2359 10/05/17 0515 10/05/17 0831  BP: 117/65 (!) 108/53 110/63 112/69  Pulse: 72 (!) 56 (!) 53   Resp: 20 20 20    Temp: 98 F (36.7 C) 97.8 F (36.6 C) 98 F (36.7 C)   TempSrc: Oral Oral Oral   SpO2: 98% 95% 99%   Weight:   76.5 kg (168 lb 9.6 oz)   Height:        Intake/Output Summary (Last 24 hours) at 10/05/2017 1128 Last data filed at 10/05/2017 0900 Gross per 24 hour  Intake 480 ml  Output -  Net 480 ml   Filed Weights   10/04/17 1319 10/04/17 1623 10/05/17 0515  Weight: 78.5 kg (173 lb) 77 kg (169 lb 12.8 oz) 76.5 kg (168 lb 9.6 oz)    Examination:  General exam: Appears calm and comfortable  Respiratory system: Clear to auscultation. Respiratory effort normal. Cardiovascular system: S1 & S2 heard, RRR.  Gastrointestinal system: Abdomen is nondistended, soft and nontender.Normal bowel sounds heard. Central nervous system: Alert and oriented. No focal neurological deficits. Extremities: Symmetric 5 x 5 power. Skin: No rashes, lesions or ulcers Psychiatry: Judgement and insight appear normal. Mood & affect appropriate.     Data Reviewed:   CBC: Recent Labs  Lab  10/04/17 1325 10/04/17 1549  WBC 5.8 6.8  HGB 11.3* 11.9*  HCT 34.9* 37.1*  MCV 93.6 94.6  PLT 251 710   Basic Metabolic Panel: Recent Labs  Lab 10/04/17 1325 10/04/17 1549  NA 137  --   K 4.4  --   CL 104  --   CO2 22  --   GLUCOSE 108*  --   BUN 12  --   CREATININE 0.88 0.88  CALCIUM 8.6*  --     GFR: Estimated Creatinine Clearance: 62.4 mL/min (by C-G formula based on SCr of 0.88 mg/dL). Liver Function Tests: No results for input(s): AST, ALT, ALKPHOS, BILITOT, PROT, ALBUMIN in the last 168 hours. No results for input(s): LIPASE, AMYLASE in the last 168 hours. No results for input(s): AMMONIA in the last 168 hours. Coagulation Profile: No results for input(s): INR, PROTIME in the last 168 hours. Cardiac Enzymes: Recent Labs  Lab 10/05/17 0457  TROPONINI <0.03   BNP (last 3 results) No results for input(s): PROBNP in the last 8760 hours. HbA1C: No results for input(s): HGBA1C in the last 72 hours. CBG: Recent Labs  Lab 10/04/17 1628 10/04/17 2113 10/05/17 0741  GLUCAP 110* 162* 132*   Lipid Profile: No results for input(s): CHOL, HDL, LDLCALC, TRIG, CHOLHDL, LDLDIRECT in the last 72 hours. Thyroid Function Tests: No results for input(s): TSH, T4TOTAL, FREET4, T3FREE, THYROIDAB in the last 72 hours. Anemia Panel: No results for input(s): VITAMINB12, FOLATE, FERRITIN, TIBC, IRON, RETICCTPCT in the last 72 hours. Urine analysis:    Component Value Date/Time   COLORURINE YELLOW 09/26/2017 1737   APPEARANCEUR CLEAR 09/26/2017 1737   LABSPEC 1.018 09/26/2017 1737   PHURINE 5.0 09/26/2017 1737   GLUCOSEU NEGATIVE 09/26/2017 1737   HGBUR NEGATIVE 09/26/2017 1737   BILIRUBINUR NEGATIVE 09/26/2017 1737   KETONESUR NEGATIVE 09/26/2017 1737   PROTEINUR NEGATIVE 09/26/2017 1737   UROBILINOGEN 0.2 10/09/2014 2315   NITRITE NEGATIVE 09/26/2017 1737   LEUKOCYTESUR MODERATE (A) 09/26/2017 1737   Sepsis Labs: @LABRCNTIP (procalcitonin:4,lacticidven:4)  )No results found for this or any previous visit (from the past 240 hour(s)).       Radiology Studies: Dg Chest 2 View  Result Date: 10/04/2017 CLINICAL DATA:  Intermittent chest pressure radiating to the right shoulder and arm. EXAM: CHEST - 2 VIEW COMPARISON:  Chest x-ray dated September 26, 2017. FINDINGS: The heart  size and mediastinal contours are within normal limits. Normal pulmonary vascularity. No focal consolidation, pleural effusion, or pneumothorax. No acute osseous abnormality. IMPRESSION: No active cardiopulmonary disease. Electronically Signed   By: Titus Dubin M.D.   On: 10/04/2017 14:24        Scheduled Meds: . nitroGLYCERIN      . aspirin EC  325 mg Oral Daily  . diltiazem  120 mg Oral Daily  . enoxaparin (LOVENOX) injection  40 mg Subcutaneous Q24H  . flecainide  225 mg Oral Q12H  . insulin aspart  0-15 Units Subcutaneous TID WC  . insulin glargine  8 Units Subcutaneous BID  . iopamidol      . ipratropium-albuterol  3 mL Nebulization Q6H  . loratadine  10 mg Oral QPM  . metoprolol succinate  12.5 mg Oral Daily  . multivitamin with minerals  1 tablet Oral BID  . oxybutynin  5 mg Oral Daily  . pantoprazole  40 mg Oral BID AC  . polyethylene glycol  17 g Oral BID  . pravastatin  40 mg Oral QPM  . tamsulosin  0.4 mg Oral  Daily   Continuous Infusions:   LOS: 0 days    Time spent: 7min    Domenic Polite, MD [facility] Page via [email or web address], password [email or web address] After 7PM please contact night-coverage  10/05/2017, 11:28 AM

## 2017-10-05 NOTE — Progress Notes (Signed)
Progress Note  Patient Name: [name] Date of Encounter: 10/05/2017  Primary Cardiologist: Glenetta Hew, MD   Subjective   77 year old gentleman with a history of nonobstructive coronary artery disease, atrial fibrillation-status post ablation, now on flecainide.  He is not on anticoagulation due to history of GI bleeding and anemia.  He is admitted with some episodes of chest discomfort.  His troponin levels have remained negative.  He is scheduled for a coronary CT angiogram today.    Inpatient Medications    Scheduled Meds: . aspirin EC  325 mg Oral Daily  . diltiazem  120 mg Oral Daily  . enoxaparin (LOVENOX) injection  30 mg Subcutaneous Q24H  . flecainide  225 mg Oral Q12H  . insulin aspart  0-15 Units Subcutaneous TID WC  . insulin glargine  8 Units Subcutaneous BID  . ipratropium-albuterol  3 mL Nebulization Q6H  . loratadine  10 mg Oral QPM  . metoprolol succinate  12.5 mg Oral Daily  . metoprolol tartrate  50 mg Oral Once  . multivitamin with minerals  1 tablet Oral BID  . oxybutynin  5 mg Oral Daily  . pantoprazole  40 mg Oral BID AC  . polyethylene glycol  17 g Oral BID  . pravastatin  40 mg Oral QPM  . tamsulosin  0.4 mg Oral Daily   Continuous Infusions:  PRN Meds: acetaminophen, gi cocktail, HYDROcodone-acetaminophen, morphine injection, nitroGLYCERIN, ondansetron (ZOFRAN) IV   Vital Signs    Vitals:   10/04/17 2017 10/04/17 2108 10/04/17 2359 10/05/17 0515  BP:  117/65 (!) 108/53 110/63  Pulse:  72 (!) 56 (!) 53  Resp:  20 20 20   Temp:  98 F (36.7 C) 97.8 F (36.6 C) 98 F (36.7 C)  TempSrc:  Oral Oral Oral  SpO2: 97% 98% 95% 99%  Weight:    168 lb 9.6 oz (76.5 kg)  Height:        Intake/Output Summary (Last 24 hours) at 10/05/2017 0754 Last data filed at 10/04/2017 2140 Gross per 24 hour  Intake 480 ml  Output -  Net 480 ml   Filed Weights   10/04/17 1319 10/04/17 1623 10/05/17 0515  Weight: 173 lb (78.5 kg) 169 lb 12.8 oz (77  kg) 168 lb 9.6 oz (76.5 kg)    Telemetry    NSR with 1st degree AV block  - Personally Reviewed  ECG     NSR  - Personally Reviewed  Physical Exam     GEN:  Elderly gentleman, no acute distress. Neck: No JVD Cardiac:  Regular rate. Respiratory:  Lungs are much better today.  No significant wheezes. GI: Soft, nontender, non-distended  MS: No edema; No deformity. Neuro:  Nonfocal  Psych: Normal affect   Labs    Chemistry Recent Labs  Lab 10/04/17 1325 10/04/17 1549  NA 137  --   K 4.4  --   CL 104  --   CO2 22  --   GLUCOSE 108*  --   BUN 12  --   CREATININE 0.88 0.88  CALCIUM 8.6*  --   GFRNONAA >60 >60  GFRAA >60 >60  ANIONGAP 11  --      Hematology Recent Labs  Lab 10/04/17 1325 10/04/17 1549  WBC 5.8 6.8  RBC 3.73* 3.92*  HGB 11.3* 11.9*  HCT 34.9* 37.1*  MCV 93.6 94.6  MCH 30.3 30.4  MCHC 32.4 32.1  RDW 13.8 13.8  PLT 251 269    Cardiac Enzymes Recent  Labs  Lab 10/05/17 0457  TROPONINI <0.03    Recent Labs  Lab 10/04/17 1335  TROPIPOC 0.01     BNPNo results for input(s): BNP, PROBNP in the last 168 hours.   DDimer No results for input(s): DDIMER in the last 168 hours.   Radiology    Dg Chest 2 View  Result Date: 10/04/2017 CLINICAL DATA:  Intermittent chest pressure radiating to the right shoulder and arm. EXAM: CHEST - 2 VIEW COMPARISON:  Chest x-ray dated September 26, 2017. FINDINGS: The heart size and mediastinal contours are within normal limits. Normal pulmonary vascularity. No focal consolidation, pleural effusion, or pneumothorax. No acute osseous abnormality. IMPRESSION: No active cardiopulmonary disease. Electronically Signed   By: Titus Dubin M.D.   On: 10/04/2017 14:24    Cardiac Studies    Patient Profile      76 year old gentleman admitted with atypical chest pain.  Assessment & Plan    1.  Chest pain: Patient has nonobstructive disease by heart cath several years ago.  He presents with atypical chest pain.   His troponin levels are negative.  He is scheduled for a coronary CT angiogram today. Getting  an echocardiogram today.      For questions or updates, please contact [place] Please consult [email or web address] for contact info under Cardiology/STEMI.      Signed, Mertie Moores, MD  10/05/2017, 7:54 AM

## 2017-10-05 NOTE — Progress Notes (Signed)
   Dr Meda Coffee reviewed cardiac CT results w/ Dr Acie Fredrickson. It is abnormal, with RCA dz that may be significant (some motion artifact noted). They agree that cardiac cath is indicated.  Spoke w/ pt by phone.   He had a sandwich and beverage w/ lunch, agrees not to eat or drink any more.   The risks and benefits of a cardiac catheterization including, but not limited to, death, stroke, MI, kidney damage and bleeding were discussed with the patient who indicates understanding and agrees to proceed.   Put him on the board, orders written.   Rosaria Ferries, PA-C 10/05/2017 1:01 PM Beeper [phone]

## 2017-10-05 NOTE — Progress Notes (Signed)
  Echocardiogram 2D Echocardiogram has been performed.  [name] 10/05/2017, 10:50 AM

## 2017-10-06 DIAGNOSIS — I480 Paroxysmal atrial fibrillation: Secondary | ICD-10-CM | POA: Diagnosis not present

## 2017-10-06 DIAGNOSIS — I25119 Atherosclerotic heart disease of native coronary artery with unspecified angina pectoris: Secondary | ICD-10-CM | POA: Diagnosis not present

## 2017-10-06 DIAGNOSIS — I5032 Chronic diastolic (congestive) heart failure: Secondary | ICD-10-CM | POA: Diagnosis not present

## 2017-10-06 DIAGNOSIS — I10 Essential (primary) hypertension: Secondary | ICD-10-CM | POA: Diagnosis not present

## 2017-10-06 DIAGNOSIS — R079 Chest pain, unspecified: Secondary | ICD-10-CM | POA: Diagnosis not present

## 2017-10-06 LAB — CBC
HCT: 32 % — ABNORMAL LOW (ref 39.0–52.0)
Hemoglobin: 10 g/dL — ABNORMAL LOW (ref 13.0–17.0)
MCH: 29.5 pg (ref 26.0–34.0)
MCHC: 31.3 g/dL (ref 30.0–36.0)
MCV: 94.4 fL (ref 78.0–100.0)
PLATELETS: 213 10*3/uL (ref 150–400)
RBC: 3.39 MIL/uL — ABNORMAL LOW (ref 4.22–5.81)
RDW: 13.9 % (ref 11.5–15.5)
WBC: 8.5 10*3/uL (ref 4.0–10.5)

## 2017-10-06 LAB — BASIC METABOLIC PANEL
Anion gap: 8 (ref 5–15)
BUN: 12 mg/dL (ref 8–23)
CALCIUM: 8.1 mg/dL — AB (ref 8.9–10.3)
CO2: 25 mmol/L (ref 22–32)
Chloride: 103 mmol/L (ref 98–111)
Creatinine, Ser: 0.85 mg/dL (ref 0.61–1.24)
GFR calc Af Amer: >60 mL/min (ref >60)
GLUCOSE: 118 mg/dL — AB (ref 70–99)
Potassium: 3.9 mmol/L (ref 3.5–5.1)
Sodium: 136 mmol/L (ref 135–145)

## 2017-10-06 LAB — GLUCOSE, CAPILLARY: Glucose-Capillary: 129 mg/dL — ABNORMAL HIGH (ref 70–99)

## 2017-10-06 MED ORDER — ASPIRIN EC 81 MG PO TBEC: 81.0000 mg | DELAYED_RELEASE_TABLET | Freq: Every day | ORAL | Status: DC

## 2017-10-06 NOTE — Progress Notes (Signed)
Progress Note  Patient Name: [name] Date of Encounter: 10/06/2017  Primary Cardiologist: Dr. Glenetta Hew  Subjective   No chest pain or palpitations.  Currently having a breathing treatment.  No abdominal pain.  States that he has trouble with chronic back pain.  Inpatient Medications    Scheduled Meds: . aspirin EC  325 mg Oral Daily  . diltiazem  120 mg Oral Daily  . enoxaparin (LOVENOX) injection  40 mg Subcutaneous Q24H  . flecainide  225 mg Oral Q12H  . insulin aspart  0-15 Units Subcutaneous TID WC  . insulin glargine  8 Units Subcutaneous BID  . ipratropium-albuterol  3 mL Nebulization TID  . loratadine  10 mg Oral QPM  . metoprolol succinate  12.5 mg Oral Daily  . multivitamin with minerals  1 tablet Oral BID  . oxybutynin  5 mg Oral Daily  . pantoprazole  40 mg Oral BID AC  . polyethylene glycol  17 g Oral BID  . pravastatin  40 mg Oral QPM  . sodium chloride flush  3 mL Intravenous Q12H  . tamsulosin  0.4 mg Oral Daily   Continuous Infusions: . sodium chloride     PRN Meds: sodium chloride, acetaminophen, gi cocktail, HYDROcodone-acetaminophen, morphine injection, nitroGLYCERIN, ondansetron (ZOFRAN) IV, sodium chloride flush   Vital Signs    Vitals:   10/05/17 2203 10/06/17 0040 10/06/17 0448 10/06/17 0929  BP: 98/84 (!) 92/45 (!) 106/42 138/65  Pulse: 88 73 64   Resp:  16 20   Temp:  98.3 F (36.8 C) 97.7 F (36.5 C)   TempSrc:  Oral Oral   SpO2:  95% 98%   Weight:   169 lb 8 oz (76.9 kg)   Height:        Intake/Output Summary (Last 24 hours) at 10/06/2017 0951 Last data filed at 10/06/2017 0300 Gross per 24 hour  Intake 1859.03 ml  Output 100 ml  Net 1759.03 ml   Filed Weights   10/04/17 1623 10/05/17 0515 10/06/17 0448  Weight: 169 lb 12.8 oz (77 kg) 168 lb 9.6 oz (76.5 kg) 169 lb 8 oz (76.9 kg)    Telemetry    Sinus rhythm. Personally reviewed.  ECG    Tracing from 10/05/2017 shows sinus bradycardia with prolonged PR  interval.  Personally reviewed.  Physical Exam   GEN:  Elderly male.  No acute distress.   Neck: No JVD. Cardiac: RRR, no gallop.  Respiratory: Nonlabored.  Decreased breath sounds without wheezing. GI: Soft, nontender, bowel sounds present. MS: No edema; No deformity. Neuro:  Nonfocal. Psych: Alert and oriented x 3. Normal affect.  Labs    Chemistry Recent Labs  Lab 10/04/17 1325  10/05/17 1034 10/05/17 1619 10/06/17 0434  NA 137  --  140  --  136  K 4.4  --  4.1  --  3.9  CL 104  --  105  --  103  CO2 22  --  24  --  25  GLUCOSE 108*  --  136*  --  118*  BUN 12  --  16  --  12  CREATININE 0.88   < > 0.91 1.03 0.85  CALCIUM 8.6*  --  8.3*  --  8.1*  GFRNONAA >60   < > >60 >60 >60  GFRAA >60   < > >60 >60 >60  ANIONGAP 11  --  11  --  8   < > = values in this interval not displayed.  Hematology Recent Labs  Lab 10/04/17 1549 10/05/17 1619 10/06/17 0434  WBC 6.8 5.6 8.5  RBC 3.92* 4.07* 3.39*  HGB 11.9* 12.1* 10.0*  HCT 37.1* 37.7* 32.0*  MCV 94.6 92.6 94.4  MCH 30.4 29.7 29.5  MCHC 32.1 32.1 31.3  RDW 13.8 13.8 13.9  PLT 269 252 213    Cardiac Enzymes Recent Labs  Lab 10/05/17 0457 10/05/17 1034 10/05/17 1619  TROPONINI <0.03 <0.03 <0.03    Recent Labs  Lab 10/04/17 1335  TROPIPOC 0.01     Radiology    Dg Chest 2 View  Result Date: 10/04/2017 CLINICAL DATA:  Intermittent chest pressure radiating to the right shoulder and arm. EXAM: CHEST - 2 VIEW COMPARISON:  Chest x-ray dated September 26, 2017. FINDINGS: The heart size and mediastinal contours are within normal limits. Normal pulmonary vascularity. No focal consolidation, pleural effusion, or pneumothorax. No acute osseous abnormality. IMPRESSION: No active cardiopulmonary disease. Electronically Signed   By: Titus Dubin M.D.   On: 10/04/2017 14:24   Ct Coronary Morph W/cta Cor W/score W/ca W/cm &/or Wo/cm  Addendum Date: 10/05/2017   ADDENDUM REPORT: 10/05/2017 14:41 CLINICAL DATA:   77 year old male with h/o atrial fibrillation and DOE. EXAM: Cardiac/Coronary  CT TECHNIQUE: The patient was scanned on a [employer]. FINDINGS: A 120 kV prospective scan was triggered in the descending thoracic aorta at 111 HU's. Axial non-contrast 3 mm slices were carried out through the heart. The data set was analyzed on a dedicated work station and scored using the [employer]. Gantry rotation speed was 250 msecs and collimation was .6 mm. No beta blockade and 0.8 mg of sl NTG was given. The 3D data set was reconstructed in 5% intervals of the 67-82 % of the R-R cycle. Diastolic phases were analyzed on a dedicated work station using MPR, MIP and VRT modes. The patient received 80 cc of contrast. Aorta: Normal size. Minimal atheroma and calcifications. No dissection. Aortic Valve:  Trileaflet.  No calcifications. Coronary Arteries:  Normal coronary origin.  Right dominance. RCA is a large dominant artery that gives rise to PDA and PLA. There is a moderate diffuse mixed plaque with suspicion for a severe, focal > 70% stenosis in the mid RCA. Left main is a long and large artery that gives rise to LAD and LCX arteries. Left main has no plaque. LAD is a large vessel that has moderate diffuse predominantly calcified plaque. Mid LAD is affected by motion however there appears to be a focal stenosis of 50-69% but possibly > 70% stenosis. LCX is a very small non-dominant artery that gives rise to two small diagonal arteries, there is no significant plaque. Other findings: Normal pulmonary vein drainage into the left atrium. Normal let atrial appendage without a thrombus. Normal size of the pulmonary artery. IMPRESSION: 1. Coronary calcium score of 1354. This was 50 percentile for age and sex matched control. 2. Normal coronary origin with right dominance. 3. Moderate to severe diffuse plaque. The study is affected by motion, however there is a suspicion for a severe stenosis in the mid RCA and LAD. A  cardiac catheterization is recommended. Electronically Signed   By: Ena Dawley   On: 10/05/2017 14:41   Result Date: 10/05/2017 EXAM: OVER-READ INTERPRETATION  CT CHEST The following report is an over-read performed by radiologist Dr. Vinnie Langton of [facility], [place] on 10/05/2017. This over-read does not include interpretation of cardiac or coronary anatomy or pathology. The coronary calcium score/coronary CTA interpretation by  the cardiologist is attached. COMPARISON:  None. FINDINGS: Aortic atherosclerosis. Small calcified granuloma in the right middle lobe incidentally noted. Within the visualized portions of the thorax there are no suspicious appearing pulmonary nodules or masses, there is no acute consolidative airspace disease, no pleural effusions, no pneumothorax and no lymphadenopathy. Visualized portions of the upper abdomen are unremarkable. Surgical clips adjacent to the distal esophagus, potentially from prior vagotomy. There are no aggressive appearing lytic or blastic lesions noted in the visualized portions of the skeleton. IMPRESSION: 1.  Aortic Atherosclerosis (ICD10-I70.0). Electronically Signed: By: Vinnie Langton M.D. On: 10/05/2017 12:09   Dg Abd 2 Views  Result Date: 10/05/2017 CLINICAL DATA:  Left flank pain. History of kidney stone for 3 weeks. EXAM: ABDOMEN - 2 VIEW COMPARISON:  CT scan Jul 27, 2017 FINDINGS: There is contrast in the renal collecting systems consistent with a CT scan from earlier today. No obvious renal stones are noted on today's study. No filling defects in the opacified portions of the renal collecting systems or bladder. No other acute abnormalities are identified. IMPRESSION: No obvious renal stones or ureteral stones. No acute abnormalities noted. Electronically Signed   By: Dorise Bullion III M.D   On: 10/05/2017 11:53    Cardiac Studies   Cardiac catheterization 10/05/2017:  Dist RCA lesion is 40% stenosed.  Ost 1st Sept lesion is  95% stenosed.  LV end diastolic pressure is normal.   1. Single vessel CAD involving the ostium of the first septal perforator. Otherwise nonobstructive disease. 2. Normal LVEDP  Plan: medical management. No obstructive disease in the RCA. Septal perforator is too small for PCI.  Echocardiogram 10/05/2017: Study Conclusions  - Left ventricle: The cavity size was normal. Wall thickness was   increased in a pattern of mild LVH. Systolic function was normal.   The estimated ejection fraction was in the range of 55% to 60%. - Aortic valve: There was trivial regurgitation. - Mitral valve: There was mild regurgitation. - Left atrium: The atrium was mildly dilated.  Patient Profile     77 y.o. male with a history of atrial for ablation/flutter status post ablation currently on flecainide but not anticoagulated due to previous bleeding and anemia, chronic diastolic heart failure, hypertension, hyperlipidemia, type of diabetes mellitus, COPD, and nonobstructive CAD.  He has undergone evaluation for recent chest pain but no evidence of ACS.  Coronary CTA and ultimately cardiac catheterization showed only a small septal perforator stenosis, otherwise nonobstructive disease.  Assessment & Plan    1.  Recent chest pain without enzymatic evidence of ACS.  Cardiac catheterization shows CAD with a small septal perforator with 95% stenosis, however no major obstructive disease within the larger epicardials.  Plan is for medical therapy.  LVEF 55 to 60% range.  Continue aspirin and Pravachol.  2.  History of atrial fibrillation and flutter status post ablation, doing well without recurrence on flecainide.  He is not anticoagulated with prior history of GI bleeding and anemia.  3.  Essential hypertension, pressure is adequately controlled on current regimen.  4.  Mixed hyperlipidemia on Pravachol.  5.  Diastolic heart failure, no clear evidence of volume overload.  6.  COPD.  At this point no  further cardiac testing is planned.  In light of the fact that he does not have major obstructive CAD within the larger epicardial's, not a clear indication to necessarily stop flecainide at this time.  He has had good rhythm control on his current regimen.  Continue aspirin  but reduced to 81 mg daily.  Also continue diltiazem CD and Toprol-XL.  [facility] will sign off.   Medication Recommendations: As discussed above would reduce aspirin to 81 mg daily. Other recommendations (labs, testing, etc): No further inpatient cardiac testing planned. Follow up as an outpatient: Would arrange visit with Dr. [name] or APP in the next 2 weeks.  Signed, Rozann Lesches, MD  10/06/2017, 9:51 AM

## 2017-10-06 NOTE — Discharge Instructions (Signed)
Radial Site Care °Refer to this sheet in the next few weeks. These instructions provide you with information about caring for yourself after your procedure. Your health care provider may also give you more specific instructions. Your treatment has been planned according to current medical practices, but problems sometimes occur. Call your health care provider if you have any problems or questions after your procedure. °What can I expect after the procedure? °After your procedure, it is typical to have the following: °· Bruising at the radial site that usually fades within 1-2 weeks. °· Blood collecting in the tissue (hematoma) that may be painful to the touch. It should usually decrease in size and tenderness within 1-2 weeks. ° °Follow these instructions at home: °· Take medicines only as directed by your health care provider. °· You may shower 24-48 hours after the procedure or as directed by your health care provider. Remove the bandage (dressing) and gently wash the site with plain soap and water. Pat the area dry with a clean towel. Do not rub the site, because this may cause bleeding. °· Do not take baths, swim, or use a hot tub until your health care provider approves. °· Check your insertion site every day for redness, swelling, or drainage. °· Do not apply powder or lotion to the site. °· Do not flex or bend the affected arm for 24 hours or as directed by your health care provider. °· Do not push or pull heavy objects with the affected arm for 24 hours or as directed by your health care provider. °· Do not lift over 10 lb (4.5 kg) for 5 days after your procedure or as directed by your health care provider. °· Ask your health care provider when it is okay to: °? Return to work or school. °? Resume usual physical activities or sports. °? Resume sexual activity. °· Do not drive home if you are discharged the same day as the procedure. Have someone else drive you. °· You may drive 24 hours after the procedure  unless otherwise instructed by your health care provider. °· Do not operate machinery or power tools for 24 hours after the procedure. °· If your procedure was done as an outpatient procedure, which means that you went home the same day as your procedure, a responsible adult should be with you for the first 24 hours after you arrive home. °· Keep all follow-up visits as directed by your health care provider. This is important. °Contact a health care provider if: °· You have a fever. °· You have chills. °· You have increased bleeding from the radial site. Hold pressure on the site. °Get help right away if: °· You have unusual pain at the radial site. °· You have redness, warmth, or swelling at the radial site. °· You have drainage (other than a small amount of blood on the dressing) from the radial site. °· The radial site is bleeding, and the bleeding does not stop after 30 minutes of holding steady pressure on the site. °· Your arm or hand becomes pale, cool, tingly, or numb. °This information is not intended to replace advice given to you by your health care provider. Make sure you discuss any questions you have with your health care provider. °Document Released: 04/15/2010 Document Revised: 08/19/2015 Document Reviewed: 09/29/2013 °Elsevier Interactive Patient Education © 2018 [employer] ° °Heart-Healthy Eating Plan °Heart-healthy meal planning includes: °· Limiting unhealthy fats. °· Increasing healthy fats. °· Making other small dietary changes. ° °You may need   to talk with your doctor or a diet specialist (dietitian) to create an eating plan that is right for you. °What types of fat should I choose? °· Choose healthy fats. These include olive oil and canola oil, flaxseeds, walnuts, almonds, and seeds. °· Eat more omega-3 fats. These include salmon, mackerel, sardines, tuna, flaxseed oil, and ground flaxseeds. Try to eat fish at least twice each week. °· Limit saturated fats. °? Saturated fats are often  found in animal products, such as meats, butter, and cream. °? Plant sources of saturated fats include palm oil, palm kernel oil, and coconut oil. °· Avoid foods with partially hydrogenated oils in them. These include stick margarine, some tub margarines, cookies, crackers, and other baked goods. These contain trans fats. °What general guidelines do I need to follow? °· Check food labels carefully. Identify foods with trans fats or high amounts of saturated fat. °· Fill one half of your plate with vegetables and green salads. Eat 4-5 servings of vegetables per day. A serving of vegetables is: °? 1 cup of raw leafy vegetables. °? ½ cup of raw or cooked cut-up vegetables. °? ½ cup of vegetable juice. °· Fill one fourth of your plate with whole grains. Look for the word "whole" as the first word in the ingredient list. °· Fill one fourth of your plate with lean protein foods. °· Eat 4-5 servings of fruit per day. A serving of fruit is: °? One medium whole fruit. °? ¼ cup of dried fruit. °? ½ cup of fresh, frozen, or canned fruit. °? ½ cup of 100% fruit juice. °· Eat more foods that contain soluble fiber. These include apples, broccoli, carrots, beans, peas, and barley. Try to get 20-30 g of fiber per day. °· Eat more home-cooked food. Eat less restaurant, buffet, and fast food. °· Limit or avoid alcohol. °· Limit foods high in starch and sugar. °· Avoid fried foods. °· Avoid frying your food. Try baking, boiling, grilling, or broiling it instead. You can also reduce fat by: °? Removing the skin from poultry. °? Removing all visible fats from meats. °? Skimming the fat off of stews, soups, and gravies before serving them. °? Steaming vegetables in water or broth. °· Lose weight if you are overweight. °· Eat 4-5 servings of nuts, legumes, and seeds per week: °? One serving of dried beans or legumes equals ½ cup after being cooked. °? One serving of nuts equals 1½ ounces. °? One serving of seeds equals ½ ounce or one  tablespoon. °· You may need to keep track of how much salt or sodium you eat. This is especially true if you have high blood pressure. Talk with your doctor or dietitian to get more information. °What foods can I eat? °Grains °Breads, including French, white, pita, wheat, raisin, rye, oatmeal, and Italian. Tortillas that are neither fried nor made with lard or trans fat. Low-fat rolls, including hotdog and hamburger buns and English muffins. Biscuits. Muffins. Waffles. Pancakes. Light popcorn. Whole-grain cereals. Flatbread. Melba toast. Pretzels. Breadsticks. Rusks. Low-fat snacks. Low-fat crackers, including oyster, saltine, matzo, graham, animal, and rye. Rice and pasta, including brown rice and pastas that are made with whole wheat. °Vegetables °All vegetables. °Fruits °All fruits, but limit coconut. °Meats and Other Protein Sources °Lean, well-trimmed beef, veal, pork, and lamb. Chicken and turkey without skin. All fish and shellfish. Wild duck, rabbit, pheasant, and venison. Egg whites or low-cholesterol egg substitutes. Dried beans, peas, lentils, and tofu. Seeds and most nuts. °Dairy °Low-fat   or nonfat cheeses, including ricotta, string, and mozzarella. Skim or 1% milk that is liquid, powdered, or evaporated. Buttermilk that is made with low-fat milk. Nonfat or low-fat yogurt. °Beverages °Mineral water. Diet carbonated beverages. °Sweets and Desserts °Sherbets and fruit ices. Honey, jam, marmalade, jelly, and syrups. Meringues and gelatins. Pure sugar candy, such as hard candy, jelly beans, gumdrops, mints, marshmallows, and small amounts of dark chocolate. Angel food cake. °Eat all sweets and desserts in moderation. °Fats and Oils °Nonhydrogenated (trans-free) margarines. Vegetable oils, including soybean, sesame, sunflower, olive, peanut, safflower, corn, canola, and cottonseed. Salad dressings or mayonnaise made with a vegetable oil. Limit added fats and oils that you use for cooking, baking, salads, and  as spreads. °Other °Cocoa powder. Coffee and tea. All seasonings and condiments. °The items listed above may not be a complete list of recommended foods or beverages. Contact your dietitian for more options. °What foods are not recommended? °Grains °Breads that are made with saturated or trans fats, oils, or whole milk. Croissants. Butter rolls. Cheese breads. Sweet rolls. Donuts. Buttered popcorn. Chow mein noodles. High-fat crackers, such as cheese or butter crackers. °Meats and Other Protein Sources °Fatty meats, such as hotdogs, short ribs, sausage, spareribs, bacon, rib eye roast or steak, and mutton. High-fat deli meats, such as salami and bologna. Caviar. Domestic duck and goose. Organ meats, such as kidney, liver, sweetbreads, and heart. °Dairy °Cream, sour cream, cream cheese, and creamed cottage cheese. Whole-milk cheeses, including blue (bleu), Monterey Jack, Brie, [place], American, Havarti, Swiss, cheddar, Camembert, and [place]. Whole or 2% milk that is liquid, evaporated, or condensed. Whole buttermilk. Cream sauce or high-fat cheese sauce. Yogurt that is made from whole milk. °Beverages °Regular sodas and juice drinks with added sugar. °Sweets and Desserts °Frosting. Pudding. Cookies. Cakes other than angel food cake. Candy that has milk chocolate or white chocolate, hydrogenated fat, butter, coconut, or unknown ingredients. Buttered syrups. Full-fat ice cream or ice cream drinks. °Fats and Oils °Gravy that has suet, meat fat, or shortening. Cocoa butter, hydrogenated oils, palm oil, coconut oil, palm kernel oil. These can often be found in baked products, candy, fried foods, nondairy creamers, and whipped toppings. Solid fats and shortenings, including bacon fat, salt pork, lard, and butter. Nondairy cream substitutes, such as coffee creamers and sour cream substitutes. Salad dressings that are made of unknown oils, cheese, or sour cream. °The items listed above may not be a complete list of foods  and beverages to avoid. Contact your dietitian for more information. °This information is not intended to replace advice given to you by your health care provider. Make sure you discuss any questions you have with your health care provider. °Document Released: 09/12/2011 Document Revised: 08/19/2015 Document Reviewed: 09/04/2013 °Elsevier Interactive Patient Education © 2018 Elsevier Inc. ° °

## 2017-10-06 NOTE — Care Management Obs Status (Signed)
[facility] NOTIFICATION   Patient Details  Name: [name] MRN: [id number] Date of Birth: [birth year]   Medicare Observation Status Notification Given:  Yes    Sharin Mons, RN 10/06/2017, 11:12 AM

## 2017-10-08 ENCOUNTER — Encounter (HOSPITAL_COMMUNITY): Payer: Self-pay | Admitting: Cardiology

## 2017-10-08 MED FILL — Heparin Sod (Porcine)-NaCl IV Soln 1000 Unit/500ML-0.9%: INTRAVENOUS | Qty: 1000 | Status: AC

## 2017-10-09 NOTE — Discharge Summary (Signed)
Physician Discharge Summary  [name] TGY:[id number] DOB: 13-Aug-1940 DOA: 10/04/2017  PCP: Trevor Bill, MD  Admit date: 10/04/2017 Discharge date: 10/06/2017  Time spent: 35 minutes  Recommendations for Outpatient Follow-up:  1. Follow-up with cardiology Dr. Ellyn [name] in 2 weeks   Discharge Diagnoses:  Principal Problem:   Chest pain with moderate risk of acute coronary syndrome Active Problems:   Atrial flutter - status post CTI ablation   Essential hypertension   Chronic diastolic congestive heart failure (HCC)   Coronary artery disease, non-occlusive   Hyperlipidemia   Type 2 diabetes mellitus (HCC)   Near syncope   Symptomatic anemia   COPD (chronic obstructive pulmonary disease) ([facility])   Discharge Condition: improved  Diet recommendation: DM heart healthy  Filed Weights   10/04/17 1623 10/05/17 0515 10/06/17 0448  Weight: 77 kg (169 lb 12.8 oz) 76.5 kg (168 lb 9.6 oz) 76.9 kg (169 lb 8 oz)    History of present illness:  Trevor Powellis a 77 y.o.malewith medical history significant ofobstructive coronary disease with last cardiac catheterization in 2012 and a negative nuclear test in 2015, retention, history of atrial flutter status post ablation currently maintaining ectopic atrial rhythm, COPD, diabetes on oral agents and insulin, ongoing tobacco abuse with a history of a Billroth II gastrojejunostomy procedure, chronic intermittent GI blood loss anemia on iron infusions at the cancer center . -presented to the emergency room 7/11 with atypical chest pain, shortness of breath and near syncope 2    Hospital Course:    1. Chest pain -atypical with some typical features, -EKG without acute findings and troponin negative -History of nonobstructive CAD by cath In 2012 and low-risk Myoview in 06/2013 -Cardiology consulted, underwent coronary CTA which noted concern for RCA disease which may be significant, underwent left heart catheterization: this  noted distal RCA lesion with 40% stenosis, Ost 1st septal perforator lesion with 95% stenosis, LV end-diastolic pressure was normal. -cardiology recommended medical management, no obstructive disease noted in RCA and septal perforator was too small for PCI. -Continue aspirin, beta blocker and statin  2. Atrial flutter status post CTI ablation: Currently in ectopic atrial rhythm. -no longer on warfarin due to chronic GI blood loss -continue Toprol, Cardizem, flecainide  3. Chronic back pain for months -likely muscular or from DJD -managed with Vicodin when necessary  4. Essential hypertension:  -stable, Continue home medications.  5. Chronic diastolic congestive heart failure: -Clinically appears euvolemic, continue beta blocker, not on diuretics  6. Hyperlipidemia -Stable  7. Diabetes type 2: -stable, metformin resumed, continue Lantus, SSI  8. chronic iron deficiency anemia -Felt to be due to occult chronic GI blood loss -Followed and at the cancer center, receives iron transfusions fairly regularly. -hemoglobin fairly stable at this time  9. COPD: Currently stable and compensated. Patient reports not smoking recently.  Disposition Plan:home pending above workup  Consultants:   cardiology      Discharge Exam: Vitals:   10/06/17 0929 10/06/17 1009  BP: 138/65   Pulse:    Resp:    Temp:    SpO2:  97%    General: AAOx3 Cardiovascular: S1S2/RRR Respiratory: CTAB  Discharge Instructions   Discharge Instructions    Diet - low sodium heart healthy   Complete by:  As directed    Diet Carb Modified   Complete by:  As directed    Increase activity slowly   Complete by:  As directed      Allergies as of 10/06/2017  Reactions   Cyclobenzaprine Other (See Comments)   Unsteady gait      Medication List    TAKE these medications   aspirin EC 81 MG tablet Take 81 mg by mouth daily.   co-enzyme Q-10 30 MG capsule Take 30 mg by  mouth daily.   diltiazem 120 MG 24 hr capsule Commonly known as:  CARDIZEM CD Take 1 capsule (120 mg total) by mouth daily.   flecainide 150 MG tablet Commonly known as:  TAMBOCOR Take 1.5 tablets (225 mg total) by mouth 2 (two) times daily.   HYDROcodone-acetaminophen 5-325 MG tablet Commonly known as:  NORCO/VICODIN Take 1 tablet by mouth every 8 (eight) hours as needed for moderate pain.   hydrocortisone cream 1 % Apply 1 application topically 2 (two) times daily.   Insulin Glargine 100 UNIT/ML Solostar Pen Commonly known as:  LANTUS Inject 8 Units into the skin 2 (two) times daily.   loratadine 10 MG tablet Commonly known as:  CLARITIN Take 10 mg by mouth every evening.   metFORMIN 1000 MG tablet Commonly known as:  GLUCOPHAGE Take 1,000 mg by mouth 2 (two) times daily with a meal.   metoprolol succinate 25 MG 24 hr tablet Commonly known as:  TOPROL-XL TAKE 1/2 TABLET  (12.5 MG TOTAL) BY MOUTH DAILY.   multivitamin with minerals Tabs tablet Take 1 tablet by mouth 2 (two) times daily.   nitroGLYCERIN 0.4 MG SL tablet Commonly known as:  NITROSTAT Place 1 tablet (0.4 mg total) under the tongue every 5 (five) minutes x 3 doses as needed for chest pain.   oxybutynin 5 MG 24 hr tablet Commonly known as:  DITROPAN-XL Take 5 mg by mouth daily.   pantoprazole 40 MG tablet Commonly known as:  PROTONIX Take 1 tablet (40 mg total) by mouth 2 (two) times daily before a meal.   polyethylene glycol packet Commonly known as:  MIRALAX / GLYCOLAX Take 17 g by mouth 2 (two) times daily. What changed:    when to take this  reasons to take this   pravastatin 40 MG tablet Commonly known as:  PRAVACHOL Take 1 tablet (40 mg total) by mouth every evening.   tamsulosin 0.4 MG Caps capsule Commonly known as:  FLOMAX Take 0.4 mg by mouth daily.      Allergies  Allergen Reactions  . Cyclobenzaprine Other (See Comments)    Unsteady gait      The results of  significant diagnostics from this hospitalization (including imaging, microbiology, ancillary and laboratory) are listed below for reference.    Significant Diagnostic Studies: Dg Chest 2 View  Result Date: 10/04/2017 CLINICAL DATA:  Intermittent chest pressure radiating to the right shoulder and arm. EXAM: CHEST - 2 VIEW COMPARISON:  Chest x-ray dated September 26, 2017. FINDINGS: The heart size and mediastinal contours are within normal limits. Normal pulmonary vascularity. No focal consolidation, pleural effusion, or pneumothorax. No acute osseous abnormality. IMPRESSION: No active cardiopulmonary disease. Electronically Signed   By: Titus Dubin M.D.   On: 10/04/2017 14:24   Dg Chest 2 View  Result Date: 09/26/2017 CLINICAL DATA:  Generalized weakness and anemia. Numbness and tingling in the hands, arms and feet. EXAM: CHEST - 2 VIEW COMPARISON:  07/29/2017 FINDINGS: Heart size is normal. Mediastinal shadows are unremarkable except for aortic atherosclerosis. The lungs are clear. The vascularity is normal. No effusions. No acute bone finding. IMPRESSION: No active cardiopulmonary disease. Electronically Signed   By: Nelson Chimes M.D.   On: 09/26/2017  17:51   Ct Coronary Morph W/cta Cor W/score W/ca W/cm &/or Wo/cm  Addendum Date: 10/05/2017   ADDENDUM REPORT: 10/05/2017 14:41 CLINICAL DATA:  77 year old male with h/o atrial fibrillation and DOE. EXAM: Cardiac/Coronary  CT TECHNIQUE: The patient was scanned on a [employer]. FINDINGS: A 120 kV prospective scan was triggered in the descending thoracic aorta at 111 HU's. Axial non-contrast 3 mm slices were carried out through the heart. The data set was analyzed on a dedicated work station and scored using the Foster. Gantry rotation speed was 250 msecs and collimation was .6 mm. No beta blockade and 0.8 mg of sl NTG was given. The 3D data set was reconstructed in 5% intervals of the 67-82 % of the R-R cycle. Diastolic phases were  analyzed on a dedicated work station using MPR, MIP and VRT modes. The patient received 80 cc of contrast. Aorta: Normal size. Minimal atheroma and calcifications. No dissection. Aortic Valve:  Trileaflet.  No calcifications. Coronary Arteries:  Normal coronary origin.  Right dominance. RCA is a large dominant artery that gives rise to PDA and PLA. There is a moderate diffuse mixed plaque with suspicion for a severe, focal > 70% stenosis in the mid RCA. Left main is a long and large artery that gives rise to LAD and LCX arteries. Left main has no plaque. LAD is a large vessel that has moderate diffuse predominantly calcified plaque. Mid LAD is affected by motion however there appears to be a focal stenosis of 50-69% but possibly > 70% stenosis. LCX is a very small non-dominant artery that gives rise to two small diagonal arteries, there is no significant plaque. Other findings: Normal pulmonary vein drainage into the left atrium. Normal let atrial appendage without a thrombus. Normal size of the pulmonary artery. IMPRESSION: 1. Coronary calcium score of 1354. This was 33 percentile for age and sex matched control. 2. Normal coronary origin with right dominance. 3. Moderate to severe diffuse plaque. The study is affected by motion, however there is a suspicion for a severe stenosis in the mid RCA and LAD. A cardiac catheterization is recommended. Electronically Signed   By: Ena Dawley   On: 10/05/2017 14:41   Result Date: 10/05/2017 EXAM: OVER-READ INTERPRETATION  CT CHEST The following report is an over-read performed by radiologist Dr. Vinnie Langton of [facility], [place] on 10/05/2017. This over-read does not include interpretation of cardiac or coronary anatomy or pathology. The coronary calcium score/coronary CTA interpretation by the cardiologist is attached. COMPARISON:  None. FINDINGS: Aortic atherosclerosis. Small calcified granuloma in the right middle lobe incidentally noted. Within the  visualized portions of the thorax there are no suspicious appearing pulmonary nodules or masses, there is no acute consolidative airspace disease, no pleural effusions, no pneumothorax and no lymphadenopathy. Visualized portions of the upper abdomen are unremarkable. Surgical clips adjacent to the distal esophagus, potentially from prior vagotomy. There are no aggressive appearing lytic or blastic lesions noted in the visualized portions of the skeleton. IMPRESSION: 1.  Aortic Atherosclerosis (ICD10-I70.0). Electronically Signed: By: Vinnie Langton M.D. On: 10/05/2017 12:09   Dg Abd 2 Views  Result Date: 10/05/2017 CLINICAL DATA:  Left flank pain. History of kidney stone for 3 weeks. EXAM: ABDOMEN - 2 VIEW COMPARISON:  CT scan Jul 27, 2017 FINDINGS: There is contrast in the renal collecting systems consistent with a CT scan from earlier today. No obvious renal stones are noted on today's study. No filling defects in the opacified portions of the  renal collecting systems or bladder. No other acute abnormalities are identified. IMPRESSION: No obvious renal stones or ureteral stones. No acute abnormalities noted. Electronically Signed   By: Dorise Bullion III M.D   On: 10/05/2017 11:53    Microbiology: No results found for this or any previous visit (from the past 240 hour(s)).   Labs: Basic Metabolic Panel: Recent Labs  Lab 10/04/17 1325 10/04/17 1549 10/05/17 1034 10/05/17 1619 10/06/17 0434  NA 137  --  140  --  136  K 4.4  --  4.1  --  3.9  CL 104  --  105  --  103  CO2 22  --  24  --  25  GLUCOSE 108*  --  136*  --  118*  BUN 12  --  16  --  12  CREATININE 0.88 0.88 0.91 1.03 0.85  CALCIUM 8.6*  --  8.3*  --  8.1*   Liver Function Tests: No results for input(s): AST, ALT, ALKPHOS, BILITOT, PROT, ALBUMIN in the last 168 hours. No results for input(s): LIPASE, AMYLASE in the last 168 hours. No results for input(s): AMMONIA in the last 168 hours. CBC: Recent Labs  Lab  10/04/17 1325 10/04/17 1549 10/05/17 1619 10/06/17 0434  WBC 5.8 6.8 5.6 8.5  HGB 11.3* 11.9* 12.1* 10.0*  HCT 34.9* 37.1* 37.7* 32.0*  MCV 93.6 94.6 92.6 94.4  PLT 251 269 252 213   Cardiac Enzymes: Recent Labs  Lab 10/05/17 0457 10/05/17 1034 10/05/17 1619  TROPONINI <0.03 <0.03 <0.03   BNP: BNP (last 3 results) Recent Labs    12/01/16 1619 05/21/17 1559  BNP 141.7* 245.7*    ProBNP (last 3 results) No results for input(s): PROBNP in the last 8760 hours.  CBG: Recent Labs  Lab 10/05/17 0741 10/05/17 1041 10/05/17 1626 10/05/17 2050 10/06/17 0719  GLUCAP 132* 139* 129* 192* 129*       Signed:  Domenic Polite MD.  [facility] 10/09/2017, 3:16 PM

## 2017-10-10 ENCOUNTER — Other Ambulatory Visit: Payer: Self-pay

## 2017-10-10 ENCOUNTER — Inpatient Hospital Stay: Payer: Medicare Other | Attending: Hematology & Oncology | Admitting: Family

## 2017-10-10 ENCOUNTER — Encounter: Payer: Self-pay | Admitting: Family

## 2017-10-10 ENCOUNTER — Inpatient Hospital Stay: Payer: Medicare Other

## 2017-10-10 VITALS — BP 104/48 | HR 76 | Temp 98.2°F | Resp 20 | Wt 175.8 lb

## 2017-10-10 VITALS — BP 96/45

## 2017-10-10 DIAGNOSIS — D500 Iron deficiency anemia secondary to blood loss (chronic): Secondary | ICD-10-CM

## 2017-10-10 DIAGNOSIS — Z79899 Other long term (current) drug therapy: Secondary | ICD-10-CM | POA: Insufficient documentation

## 2017-10-10 DIAGNOSIS — K922 Gastrointestinal hemorrhage, unspecified: Secondary | ICD-10-CM

## 2017-10-10 DIAGNOSIS — Z794 Long term (current) use of insulin: Secondary | ICD-10-CM | POA: Insufficient documentation

## 2017-10-10 DIAGNOSIS — D508 Other iron deficiency anemias: Secondary | ICD-10-CM

## 2017-10-10 DIAGNOSIS — Z7982 Long term (current) use of aspirin: Secondary | ICD-10-CM | POA: Insufficient documentation

## 2017-10-10 LAB — CBC WITH DIFFERENTIAL (CANCER CENTER ONLY)
BASOS ABS: 0.1 10*3/uL (ref 0.0–0.1)
Basophils Relative: 1 %
EOS PCT: 4 %
Eosinophils Absolute: 0.2 10*3/uL (ref 0.0–0.5)
HEMATOCRIT: 34 % — AB (ref 38.7–49.9)
Hemoglobin: 10.9 g/dL — ABNORMAL LOW (ref 13.0–17.1)
LYMPHS ABS: 1 10*3/uL (ref 0.9–3.3)
LYMPHS PCT: 20 %
MCH: 30.1 pg (ref 28.0–33.4)
MCHC: 32.1 g/dL (ref 32.0–35.9)
MCV: 93.9 fL (ref 82.0–98.0)
MONO ABS: 0.7 10*3/uL (ref 0.1–0.9)
MONOS PCT: 14 %
Neutro Abs: 3 10*3/uL (ref 1.5–6.5)
Neutrophils Relative %: 61 %
PLATELETS: 290 10*3/uL (ref 145–400)
RBC: 3.62 MIL/uL — ABNORMAL LOW (ref 4.20–5.70)
RDW: 14.2 % (ref 11.1–15.7)
WBC Count: 5 10*3/uL (ref 4.0–10.0)

## 2017-10-10 LAB — IRON AND TIBC
Iron: 26 ug/dL — ABNORMAL LOW (ref 42–163)
SATURATION RATIOS: 8 % — AB (ref 42–163)
TIBC: 340 ug/dL (ref 202–409)
UIBC: 314 ug/dL

## 2017-10-10 LAB — CMP (CANCER CENTER ONLY)
ALT: 58 U/L — ABNORMAL HIGH (ref 10–47)
ANION GAP: 8 (ref 5–15)
AST: 31 U/L (ref 11–38)
Albumin: 2.8 g/dL — ABNORMAL LOW (ref 3.5–5.0)
Alkaline Phosphatase: 103 U/L — ABNORMAL HIGH (ref 26–84)
BUN: 12 mg/dL (ref 7–22)
CHLORIDE: 102 mmol/L (ref 98–108)
CO2: 30 mmol/L (ref 18–33)
Calcium: 8.7 mg/dL (ref 8.0–10.3)
Creatinine: 1.1 mg/dL (ref 0.60–1.20)
GLUCOSE: 215 mg/dL — AB (ref 73–118)
POTASSIUM: 4.2 mmol/L (ref 3.3–4.7)
Sodium: 140 mmol/L (ref 128–145)
Total Bilirubin: 0.3 mg/dL (ref 0.2–1.6)
Total Protein: 6.2 g/dL — ABNORMAL LOW (ref 6.4–8.1)

## 2017-10-10 LAB — FERRITIN: Ferritin: 14 ng/mL — ABNORMAL LOW (ref 24–336)

## 2017-10-10 LAB — RETICULOCYTES
RBC.: 3.62 MIL/uL — AB (ref 4.20–5.82)
RETIC CT PCT: 2.8 % — AB (ref 0.8–1.8)
Retic Count, Absolute: 101.4 10*3/uL — ABNORMAL HIGH (ref 34.8–93.9)

## 2017-10-10 MED ORDER — SODIUM CHLORIDE 0.9 % IV SOLN
510.0000 mg | Freq: Once | INTRAVENOUS | Status: AC
  Administered 2017-10-10: 510 mg via INTRAVENOUS
  Filled 2017-10-10: qty 17

## 2017-10-10 NOTE — Progress Notes (Signed)
Hematology and Oncology Follow Up Visit  [name] [id number] December 13, 1940 77 y.o. 10/10/2017   Principle Diagnosis:  Iron deficiency anemia secondary to intermittent GI bleed  Current Therapy:   IV iron as indicated Supportive transfusions as needed   Interim History: [name] is here today with his daughter for follow-up. He was in the hospital in June for chest pain and had a heart cath. He did not require stents just medication management.  He is feeling much better and is back home. His iron saturation last week was 14% and ferritin 14.  He denies having any episodes of bleeding, no bruising or petechiae.  He is symptomatic with numbness and tingling in his fingertips and toes.  No swelling or tenderness in his extremities. No lymphadenopathy noted on exam.  No fever, chills, n/v, cough, rash, dizziness, SOB, chest pain, palpitations, abdominal pain or changes in bowel or bladder habits.  He has a good appetite and is staying well hydrated. His weight is stable.   ECOG Performance Status: 1 - Symptomatic but completely ambulatory  Medications:  Allergies as of 10/10/2017      Reactions   Cyclobenzaprine Other (See Comments)   Unsteady gait      Medication List        Accurate as of 10/10/17 10:07 AM. Always use your most recent med list.          aspirin EC 81 MG tablet Take 81 mg by mouth daily.   co-enzyme Q-10 30 MG capsule Take 30 mg by mouth daily.   diltiazem 120 MG 24 hr capsule Commonly known as:  CARDIZEM CD Take 1 capsule (120 mg total) by mouth daily.   flecainide 150 MG tablet Commonly known as:  TAMBOCOR Take 1.5 tablets (225 mg total) by mouth 2 (two) times daily.   HYDROcodone-acetaminophen 5-325 MG tablet Commonly known as:  NORCO/VICODIN Take 1 tablet by mouth every 8 (eight) hours as needed for moderate pain.   hydrocortisone cream 1 % Apply 1 application topically 2 (two) times daily.   Insulin Glargine 100 UNIT/ML Solostar  Pen Commonly known as:  LANTUS Inject 8 Units into the skin 2 (two) times daily.   loratadine 10 MG tablet Commonly known as:  CLARITIN Take 10 mg by mouth every evening.   metFORMIN 1000 MG tablet Commonly known as:  GLUCOPHAGE Take 1,000 mg by mouth 2 (two) times daily with a meal.   metoprolol succinate 25 MG 24 hr tablet Commonly known as:  TOPROL-XL TAKE 1/2 TABLET  (12.5 MG TOTAL) BY MOUTH DAILY.   multivitamin with minerals Tabs tablet Take 1 tablet by mouth 2 (two) times daily.   nitroGLYCERIN 0.4 MG SL tablet Commonly known as:  NITROSTAT Place 1 tablet (0.4 mg total) under the tongue every 5 (five) minutes x 3 doses as needed for chest pain.   oxybutynin 5 MG 24 hr tablet Commonly known as:  DITROPAN-XL Take 5 mg by mouth daily.   pantoprazole 40 MG tablet Commonly known as:  PROTONIX Take 1 tablet (40 mg total) by mouth 2 (two) times daily before a meal.   polyethylene glycol packet Commonly known as:  MIRALAX / GLYCOLAX Take 17 g by mouth 2 (two) times daily.   pravastatin 40 MG tablet Commonly known as:  PRAVACHOL Take 1 tablet (40 mg total) by mouth every evening.   tamsulosin 0.4 MG Caps capsule Commonly known as:  FLOMAX Take 0.4 mg by mouth daily.       Allergies:  Allergies  Allergen Reactions  . Cyclobenzaprine Other (See Comments)    Unsteady gait    Past Medical History, Surgical history, Social history, and Family History were reviewed and updated.  Review of Systems: All other 10 point review of systems is negative.   Physical Exam:  weight is 175 lb 12.8 oz (79.7 kg). His oral temperature is 98.2 F (36.8 C). His blood pressure is 104/48 (abnormal) and his pulse is 76. His respiration is 20 and oxygen saturation is 95%.   Wt Readings from Last 3 Encounters:  10/10/17 175 lb 12.8 oz (79.7 kg)  10/06/17 169 lb 8 oz (76.9 kg)  09/26/17 173 lb (78.5 kg)    Ocular: Sclerae unicteric, pupils equal, round and reactive to  light Ear-nose-throat: Oropharynx clear, dentition fair Lymphatic: No cervical, supraclavicular or axillary adenopathy Lungs no rales or rhonchi, good excursion bilaterally Heart regular rate and rhythm, no murmur appreciated Abd soft, nontender, positive bowel sounds, no liver or spleen tip palpated on exam, no fluid wave  MSK no focal spinal tenderness, no joint edema Neuro: non-focal, well-oriented, appropriate affect Breasts: Deferred  Lab Results  Component Value Date   WBC 5.0 10/10/2017   HGB 10.9 (L) 10/10/2017   HCT 34.0 (L) 10/10/2017   MCV 93.9 10/10/2017   PLT 290 10/10/2017   Lab Results  Component Value Date   FERRITIN 14 (L) 09/26/2017   IRON 48 09/26/2017   TIBC 347 09/26/2017   UIBC 299 09/26/2017   IRONPCTSAT 14 (L) 09/26/2017   Lab Results  Component Value Date   RETICCTPCT 2.3 (H) 08/08/2017   RBC 3.62 (L) 10/10/2017   No results found for: KPAFRELGTCHN, LAMBDASER, KAPLAMBRATIO No results found for: IGGSERUM, IGA, IGMSERUM No results found for: Odetta Pink, SPEI   Chemistry      Component Value Date/Time   NA 140 10/10/2017 0916   NA 139 10/20/2016 1110   K 4.2 10/10/2017 0916   K 4.0 10/20/2016 1110   CL 102 10/10/2017 0916   CO2 30 10/10/2017 0916   CO2 26 10/20/2016 1110   BUN 12 10/10/2017 0916   BUN 15.2 10/20/2016 1110   CREATININE 1.10 10/10/2017 0916   CREATININE 0.8 10/20/2016 1110      Component Value Date/Time   CALCIUM 8.7 10/10/2017 0916   CALCIUM 9.6 10/20/2016 1110   ALKPHOS 103 (H) 10/10/2017 0916   ALKPHOS 85 10/20/2016 1110   AST 31 10/10/2017 0916   AST 33 10/20/2016 1110   ALT 58 (H) 10/10/2017 0916   ALT 42 10/20/2016 1110   BILITOT 0.3 10/10/2017 0916   BILITOT <0.22 10/20/2016 1110      Impression and Plan: [name] is a very pleasant 77 yo caucasian gentleman with iron deficiency anemia secondary to intermittent GI blood loss and malabsorption on  Protonix.  He is symptomatic as mentioned above. Iron saturation was 14% and ferritin 14.  We will give him IV iron today.  We will plan to see him back in another 6 weeks for follow-up.  They will contact our office with any questions or concerns. We can certainly see her sooner if need be.   Laverna Peace, NP 7/17/201910:07 AM

## 2017-10-10 NOTE — Patient Instructions (Signed)

## 2017-10-17 ENCOUNTER — Encounter (HOSPITAL_COMMUNITY): Payer: Self-pay

## 2017-10-17 ENCOUNTER — Emergency Department (HOSPITAL_COMMUNITY)
Admission: EM | Admit: 2017-10-17 | Discharge: 2017-10-18 | Disposition: A | Discharge status: Home / Self Care | Payer: Medicare Other | Attending: Emergency Medicine | Admitting: Emergency Medicine

## 2017-10-17 ENCOUNTER — Emergency Department (HOSPITAL_COMMUNITY): Payer: Medicare Other

## 2017-10-17 ENCOUNTER — Other Ambulatory Visit: Payer: Self-pay

## 2017-10-17 ENCOUNTER — Inpatient Hospital Stay: Payer: Medicare Other

## 2017-10-17 DIAGNOSIS — F17210 Nicotine dependence, cigarettes, uncomplicated: Secondary | ICD-10-CM | POA: Diagnosis not present

## 2017-10-17 DIAGNOSIS — H539 Unspecified visual disturbance: Secondary | ICD-10-CM | POA: Insufficient documentation

## 2017-10-17 DIAGNOSIS — I5032 Chronic diastolic (congestive) heart failure: Secondary | ICD-10-CM | POA: Insufficient documentation

## 2017-10-17 DIAGNOSIS — R51 Headache: Secondary | ICD-10-CM | POA: Diagnosis not present

## 2017-10-17 DIAGNOSIS — R457 State of emotional shock and stress, unspecified: Secondary | ICD-10-CM | POA: Diagnosis not present

## 2017-10-17 DIAGNOSIS — Z79899 Other long term (current) drug therapy: Secondary | ICD-10-CM | POA: Insufficient documentation

## 2017-10-17 DIAGNOSIS — I2510 Atherosclerotic heart disease of native coronary artery without angina pectoris: Secondary | ICD-10-CM | POA: Diagnosis not present

## 2017-10-17 DIAGNOSIS — K922 Gastrointestinal hemorrhage, unspecified: Secondary | ICD-10-CM | POA: Diagnosis not present

## 2017-10-17 DIAGNOSIS — E119 Type 2 diabetes mellitus without complications: Secondary | ICD-10-CM | POA: Diagnosis not present

## 2017-10-17 DIAGNOSIS — Z7982 Long term (current) use of aspirin: Secondary | ICD-10-CM | POA: Diagnosis not present

## 2017-10-17 DIAGNOSIS — R519 Headache, unspecified: Secondary | ICD-10-CM

## 2017-10-17 DIAGNOSIS — R5383 Other fatigue: Secondary | ICD-10-CM | POA: Diagnosis not present

## 2017-10-17 DIAGNOSIS — D500 Iron deficiency anemia secondary to blood loss (chronic): Secondary | ICD-10-CM | POA: Diagnosis not present

## 2017-10-17 DIAGNOSIS — J449 Chronic obstructive pulmonary disease, unspecified: Secondary | ICD-10-CM | POA: Insufficient documentation

## 2017-10-17 DIAGNOSIS — R0902 Hypoxemia: Secondary | ICD-10-CM | POA: Diagnosis not present

## 2017-10-17 DIAGNOSIS — Z794 Long term (current) use of insulin: Secondary | ICD-10-CM | POA: Insufficient documentation

## 2017-10-17 DIAGNOSIS — I110 Hypertensive heart disease with heart failure: Secondary | ICD-10-CM | POA: Diagnosis not present

## 2017-10-17 DIAGNOSIS — J984 Other disorders of lung: Secondary | ICD-10-CM | POA: Diagnosis not present

## 2017-10-17 LAB — BASIC METABOLIC PANEL
ANION GAP: 12 (ref 5–15)
BUN: 14 mg/dL (ref 8–23)
CHLORIDE: 103 mmol/L (ref 98–111)
CO2: 23 mmol/L (ref 22–32)
Calcium: 9.2 mg/dL (ref 8.9–10.3)
Creatinine, Ser: 0.88 mg/dL (ref 0.61–1.24)
GFR calc non Af Amer: >60 mL/min (ref >60)
Glucose, Bld: 156 mg/dL — ABNORMAL HIGH (ref 70–99)
Potassium: 4.1 mmol/L (ref 3.5–5.1)
SODIUM: 138 mmol/L (ref 135–145)

## 2017-10-17 LAB — C-REACTIVE PROTEIN

## 2017-10-17 LAB — CBC
HEMATOCRIT: 37.5 % — AB (ref 39.0–52.0)
HEMOGLOBIN: 12.3 g/dL — AB (ref 13.0–17.0)
MCH: 31 pg (ref 26.0–34.0)
MCHC: 32.8 g/dL (ref 30.0–36.0)
MCV: 94.5 fL (ref 78.0–100.0)
PLATELETS: 322 10*3/uL (ref 150–400)
RBC: 3.97 MIL/uL — AB (ref 4.22–5.81)
RDW: 16.1 % — ABNORMAL HIGH (ref 11.5–15.5)
WBC: 7.2 10*3/uL (ref 4.0–10.5)

## 2017-10-17 LAB — PROTIME-INR
INR: 0.93
Prothrombin Time: 12.3 seconds (ref 11.4–15.2)

## 2017-10-17 LAB — SEDIMENTATION RATE: SED RATE: 19 mm/h — AB (ref 0–16)

## 2017-10-17 MED ORDER — PROCHLORPERAZINE EDISYLATE 10 MG/2ML IJ SOLN
5.0000 mg | Freq: Once | INTRAMUSCULAR | Status: AC
  Administered 2017-10-17: 5 mg via INTRAVENOUS
  Filled 2017-10-17: qty 2

## 2017-10-17 MED ORDER — DIPHENHYDRAMINE HCL 50 MG/ML IJ SOLN
12.5000 mg | Freq: Once | INTRAMUSCULAR | Status: AC
  Administered 2017-10-17: 12.5 mg via INTRAVENOUS
  Filled 2017-10-17: qty 1

## 2017-10-17 NOTE — ED Triage Notes (Signed)
Pt comes from home. Pt brought via [facility]. Pt called 911 due to feeling or being anxious and having memory problems that pt noticed 4-5 weeks ago and has gradually getting worse. Pt stated "something doesn't feel right'". For past 2 weeks patient has had difficulty sleeping.  Pt denies HI and SI.  Pt is AOx4 and ambulatory.

## 2017-10-17 NOTE — Discharge Instructions (Signed)
Your exam and work-up today was consistent with a complicated migraine/headache that caused your vision symptoms.  We did not find convincing evidence of temporal arteritis or other significant cause of your symptoms.  Your work-up today was otherwise reassuring.  We feel you are safe for discharge home and outpatient neurology follow-up.  If any symptoms change or worsen, please return to the nearest emergency department.

## 2017-10-17 NOTE — ED Provider Notes (Signed)
[facility] DEPT Provider Note   CSN: [id number] Arrival date & time: 10/17/17  1608     History   Chief Complaint Chief Complaint  Patient presents with  . Anxious  . Memory Problems    HPI [name] is a 77 y.o. male.  The history is provided by the patient and medical records.  Headache   This is a new problem. The current episode started more than 1 week ago. The problem occurs constantly. The problem has not changed since onset.The headache is associated with nothing. The pain is located in the bilateral and temporal region. The quality of the pain is described as sharp and dull. The pain is moderate. The pain radiates to the face. Pertinent negatives include no fever, no malaise/fatigue, no chest pressure, no palpitations, no syncope, no shortness of breath, no nausea and no vomiting. He has tried nothing for the symptoms. The treatment provided no relief.    Past Medical History:  Diagnosis Date  . Anemia    takes Ferrous Sulfate daily  . Arthritis    "all over"  . Balance problem 01/2014  . CAP (community acquired pneumonia) 09/18/2014  . Cervical radiculopathy due to degenerative joint disease of spine   . COPD (chronic obstructive pulmonary disease) ([place])   . Coronary artery disease, non-occlusive    a. 03/2010 Nonocclusive disease by cath, performed for ST elevations on ECG;  b. 06/2013 Lexi MV: EF 60%, no ischemia.  . Diabetes mellitus type II    takes Metformin and Lantus daily  . Diastolic CHF, chronic ([place])    a. 12/2012 EF 55-60%, diast dysfxn, triv MR, mildly dil LA/RA.  [name] History of blood transfusion 1982   "when I had stomach OR"  . History of bronchitis    1998  . History of gastric ulcer   . HTN (hypertension)    takes Diltiazem daily  . Hyperlipidemia    takes Pravastatin daily  . Joint pain   . PAF (paroxysmal atrial fibrillation) ([facility])    Recurrent after atrial flutter (a. 07/2010 Status post caval tricuspid  isthmus ablation by Dr. Midge Aver Metoprolol daily), currently controlled on flecainide plus diltiazem and Coumadin  . Weakness    numbness and tingling both hands    Patient Active Problem List   Diagnosis Date Noted  . Chest pain with moderate risk of acute coronary syndrome 10/04/2017  . HTN (hypertension) 05/21/2017  . Diabetes mellitus type II 05/21/2017  . Diastolic CHF, chronic ([place]) 05/21/2017  . COPD (chronic obstructive pulmonary disease) ([place]) 05/21/2017  . H/O atrial flutter 05/21/2017  . Coronary disease/nonobstructive 05/21/2017  . Acute blood loss anemia 10/02/2016  . Abdominal pain 10/02/2016  . Constipation 10/02/2016  . Left lower quadrant pain   . Coagulopathy ([place])   . Heme positive stool   . Symptomatic anemia 07/10/2016  . Syncope 05/15/2015  . Cervical disc disease 05/15/2015  . Abnormal urinalysis 05/15/2015  . Anemia 05/15/2015  . History of CVA (cerebrovascular accident) 04/03/2014  . Near syncope 02/15/2014  . Pre-syncope 02/15/2014  . Benign neoplasm of rectum and anal canal 12/02/2013  . Upper GI bleeding 11/30/2013  . Tobacco use disorder 11/30/2013  . Anticoagulation goal of INR 2 to 3, for PAF - CHA2DS2Vasc = 7 08/15/2013  . Type 2 diabetes mellitus ([place]) 01/22/2013  . Tobacco abuse counseling 12/15/2012  . Hyperlipidemia   . Obesity (BMI 30-39.9) 12/14/2012  . Atrial flutter - status post CTI ablation 07/29/2010  . Essential  hypertension 07/29/2010  . Chronic diastolic congestive heart failure ([place]) 07/29/2010  . Coronary artery disease, non-occlusive 07/29/2010    Past Surgical History:  Procedure Laterality Date  . ATRIAL ABLATION SURGERY  08/05/10   CTI ablation for atrial flutter by JA  . CARDIAC CATHETERIZATION  2012   nl LV function, no occlusive CAD, PAF  . CARDIOVERSION  12/07/2010    Successful direct current cardioversion with atrial fibrillation to normal sinus rhythm  . CARPAL TUNNEL RELEASE Bilateral 01/30/2014    Procedure: BILATERAL CARPAL TUNNEL RELEASE;  Surgeon: Marianna Payment, MD;  Location: [facility];  Service: Orthopedics;  Laterality: Bilateral;  . CATARACT EXTRACTION W/ INTRAOCULAR LENS  IMPLANT, BILATERAL Bilateral   . COLONOSCOPY [name] 12/02/2013   Procedure: COLONOSCOPY;  Surgeon: Irene Shipper, MD;  Location: [facility];  Service: Endoscopy;  Laterality: [name];  . ESOPHAGOGASTRODUODENOSCOPY [name] 09/22/2014   Procedure: ESOPHAGOGASTRODUODENOSCOPY (EGD);  Surgeon: Ronald Lobo, MD;  Location: [facility] ENDOSCOPY;  Service: Endoscopy;  Laterality: [name];  . ESOPHAGOGASTRODUODENOSCOPY [name] 07/11/2016   Procedure: ESOPHAGOGASTRODUODENOSCOPY (EGD);  Surgeon: Doran Stabler, MD;  [facility];  Service: Endoscopy;  Laterality: [name];  . INCISION AND DRAINAGE ABSCESS / HEMATOMA OF BURSA / KNEE / THIGH Left 1998   knee  . KNEE BURSECTOMY Left 1998  . LAPAROSCOPIC CHOLECYSTECTOMY  03/2010  . LEFT HEART CATH AND CORONARY ANGIOGRAPHY [name] 10/05/2017   Procedure: LEFT HEART CATH AND CORONARY ANGIOGRAPHY;  Surgeon: Martinique, Peter M, MD;  Location: [facility];  Service: Cardiovascular;  Laterality: [name];  . NM MYOVIEW LTD  07/22/2013   Normal EF ~60%, no ischemia or infarction.  [name] PARTIAL GASTRECTOMY  1982   subtotal; "took out 30% for ulcers"  . TRANSTHORACIC ECHOCARDIOGRAM  02/16/2014   EF 60%, no RWMA. - otherwise normal  . YAG LASER APPLICATION Bilateral         Home Medications    Prior to Admission medications   Medication Sig Start Date End Date Taking? Authorizing Provider  aspirin EC 81 MG tablet Take 81 mg by mouth daily.   Yes [provider]  co-enzyme Q-10 30 MG capsule Take 30 mg by mouth daily.    Yes [provider]  diltiazem (CARDIZEM CD) 120 MG 24 hr capsule Take 1 capsule (120 mg total) by mouth daily. 05/04/17  Yes Leonie Man, MD  flecainide (TAMBOCOR) 150 MG tablet Take 1.5 tablets (225 mg total) by mouth 2 (two) times daily. 05/04/17  Yes Leonie Man, MD  HYDROcodone-acetaminophen (NORCO/VICODIN) 5-325 MG tablet Take 1 tablet by mouth every 8 (eight) hours as needed for moderate pain.   Yes [provider]  hydrocortisone cream 1 % Apply 1 application topically 2 (two) times daily.  01/02/17  Yes [provider]  Insulin Glargine (LANTUS) 100 UNIT/ML Solostar Pen Inject 8 Units into the skin 2 (two) times daily. 11/30/16  Yes Domenic Polite, MD  metoprolol succinate (TOPROL-XL) 50 MG 24 hr tablet Take 25 mg by mouth daily. Take with or immediately following a meal.   Yes [provider]  pantoprazole (PROTONIX) 40 MG tablet Take 1 tablet (40 mg total) by mouth 2 (two) times daily before a meal. 11/30/16  Yes Domenic Polite, MD  polyethylene glycol (MIRALAX / GLYCOLAX) packet Take 17 g by mouth 2 (two) times daily. Patient taking differently: Take 17 g by mouth daily as needed for moderate constipation.  10/03/16  Yes Sheikh, Omair Latif, DO  pravastatin (PRAVACHOL) 40  MG tablet Take 1 tablet (40 mg total) by mouth every evening. 05/04/17  Yes Leonie Man, MD  metFORMIN (GLUCOPHAGE) 1000 MG tablet Take 1,000 mg by mouth 2 (two) times daily with a meal.     [provider]  metoprolol succinate (TOPROL-XL) 25 MG 24 hr tablet TAKE 1/2 TABLET  (12.5 MG TOTAL) BY MOUTH DAILY. Patient not taking: Reported on 10/17/2017 05/04/17   Leonie Man, MD  Multiple Vitamin (MULTIVITAMIN WITH MINERALS) TABS tablet Take 1 tablet by mouth 2 (two) times daily.     [provider]  nitroGLYCERIN (NITROSTAT) 0.4 MG SL tablet Place 1 tablet (0.4 mg total) under the tongue every 5 (five) minutes x 3 doses as needed for chest pain. 11/19/15   Erlene Quan, PA-C  oxybutynin (DITROPAN-XL) 5 MG 24 hr tablet Take 5 mg by mouth daily. 11/02/16 11/02/17  [provider]  tamsulosin (FLOMAX) 0.4 MG CAPS capsule Take 0.4 mg by mouth daily. 02/20/17   [provider]    Family History Family History  Problem  Relation Age of Onset  . Cancer Mother   . Heart attack Father   . Heart attack Brother     Social History Social History   Tobacco Use  . Smoking status: Current Every Day Smoker    Packs/day: 0.50    Years: 57.00    Pack years: 28.50    Types: Cigarettes  . Smokeless tobacco: Never Used  . Tobacco comment: He's been smoking between 0.5 and 2 ppd since age 44.  Substance Use Topics  . Alcohol use: No    Alcohol/week: 0.0 oz    Comment: "quit drinking in 1986"  . Drug use: No     Allergies   Cyclobenzaprine   Review of Systems Review of Systems  Constitutional: Positive for fatigue. Negative for chills, diaphoresis, fever and malaise/fatigue.  HENT: Negative for congestion.   Eyes: Positive for visual disturbance. Negative for photophobia.  Respiratory: Negative for chest tightness, shortness of breath and wheezing.   Cardiovascular: Negative for chest pain, palpitations, leg swelling and syncope.  Gastrointestinal: Negative for constipation, diarrhea, nausea and vomiting.  Genitourinary: Negative for dysuria, flank pain and frequency.  Musculoskeletal: Negative for back pain, neck pain and neck stiffness.  Skin: Negative for rash and wound.  Neurological: Positive for headaches. Negative for seizures, speech difficulty, weakness, light-headedness and numbness.  All other systems reviewed and are negative.    Physical Exam Updated Vital Signs Ht '5\' 6"'$  (1.676 m)   Wt 79.4 kg (175 lb)   SpO2 96%   BMI 28.25 kg/m   Physical Exam  Constitutional: He appears well-developed and well-nourished. No distress.  HENT:  Head: Atraumatic.    Nose: Nose normal.  Mouth/Throat: Oropharynx is clear and moist. No oropharyngeal exudate.  Bilateral temporal tenderness.  No numbness present.  Normal extraocular movements.  Patient's left eye is outward facing at rest.   Eyes: Pupils are equal, round, and reactive to light. Conjunctivae and EOM are normal. No scleral icterus.      Neck: Normal range of motion. Neck supple.  Cardiovascular: Normal rate and regular rhythm.  No murmur heard. Pulmonary/Chest: Effort normal and breath sounds normal. No respiratory distress. He has no wheezes. He exhibits no tenderness.  Abdominal: Soft. There is no tenderness.  Musculoskeletal: He exhibits tenderness. He exhibits no edema.  Neurological: He is alert. No sensory deficit. He exhibits normal muscle tone.  Skin: Skin is warm and dry. Capillary refill  takes less than 2 seconds. He is not diaphoretic. No erythema. No pallor.  Psychiatric: He has a normal mood and affect.  Nursing note and vitals reviewed.    ED Treatments / Results  Labs (all labs ordered are listed, but only abnormal results are displayed) Labs Reviewed  BASIC METABOLIC PANEL - Abnormal; Notable for the following components:      Result Value   Glucose, Bld 156 (*)    All other components within normal limits  CBC - Abnormal; Notable for the following components:   RBC 3.97 (*)    Hemoglobin 12.3 (*)    HCT 37.5 (*)    RDW 16.1 (*)    All other components within normal limits  SEDIMENTATION RATE - Abnormal; Notable for the following components:   Sed Rate 19 (*)    All other components within normal limits  PROTIME-INR  C-REACTIVE PROTEIN    EKG None  Radiology Dg Chest 2 View  Result Date: 10/17/2017 CLINICAL DATA:  77 year old with anxiety. EXAM: CHEST - 2 VIEW COMPARISON:  10/04/2017 and cardiac CTA 10/05/2017 FINDINGS: Lungs are clear. Heart and mediastinum are within normal limits. Trachea is midline. No significant pleural effusions. Mild degenerative changes in the thoracic spine. Negative for a pneumothorax. Small nodular densities in the lower chest bilaterally and most compatible with nipple shadows. IMPRESSION: No active cardiopulmonary disease. Electronically Signed   By: Markus Daft M.D.   On: 10/17/2017 17:23    Procedures Procedures (including critical care  time)  Medications Ordered in ED Medications  prochlorperazine (COMPAZINE) injection 5 mg (5 mg Intravenous Given 10/17/17 2207)  diphenhydrAMINE (BENADRYL) injection 12.5 mg (12.5 mg Intravenous Given 10/17/17 2206)     Initial Impression / Assessment and Plan / ED Course  I have reviewed the triage vital signs and the nursing notes.  Pertinent labs & imaging results that were available during my care of the patient were reviewed by me and considered in my medical decision making (see chart for details).     [name] is a 77 y.o. male with a past medical history significant for diabetes, CHF, COPD, CAD, hypertension, hyperlipidemia, atrial fibrillation status post a ablationn on flecainide, now off of Coumadin due to GI bleeding, and recent admission for chest pain with a nonobstructive heart cath this month who presents with bilateral temporal pain, tingling, and intermittent blurry vision.  Patient reports that for the last 3 weeks he has been having discomfort in his temple areas going across his forehead.  He denies any history of this.  He reports that he has had some vision problems in his past with diabetes but says this is new vision problems.  He reports that when his temporal discomfort and tingling is at his worst, he gets blurry vision bilaterally.  He reports no nausea no vomiting.  He denies any diarrhea but reports chronic constipation.  He denies any chest pain, palpitations or shortness of breath.  He reports that he has been more forgetful over the last several months and was told he may need work-up for possible Alzheimer's.  He is very anxious about his vision changes.  He also reports that he has been having fatigue, generalized weakness, and is been "shaky" when he tries to do things such as walk around his house.  He denies any focal weakness however.  On exam, patient had tenderness in the temporal areas.  Patient had reactive pupils bilaterally.  Normal extraocular  movements.  Patient's left eye is looking  outward at baseline.  Lungs are clear and chest is nontender.  Abdomen is nontender.  Patient has no rashes seen on his face.  Symmetric smile.  Patient is alert and oriented.  Based on exam and history I am concerned about temporal arteritis.  We will add a ESR and CRP.  Given the waxing and waning vision changes in the setting of the temporal discomfort, anticipate speaking with neurology for recommendations.  ESR slightly elevated.  Neurology was called who recommended following up on the CRP.  They report his CRP is elevated, then patient would likely to be admitted for high-dose steroids.  If CRP is not elevated, they suspect it is likely more related to a comp gated migraine or tension headache.  They then would recommend headache cocktail and reassessment.  CRP was normal.  Extremely low suspicion for temporal arteritis at this point.  Patient was given Compazine and Benadryl with complete resolution of headache and the vision changes.  Suspect complicated migraine.    Patient feels better and agrees with discharge home.  No other significant findings on exam.    Patient will follow with PCP and neurology for further management.  Patient discharged in good condition with improved symptoms.  Final Clinical Impressions(s) / ED Diagnoses   Final diagnoses:  Temporal headache    ED Discharge Orders    None      Clinical Impression: 1. Temporal headache     Disposition: Discharge  Condition: Good  I have discussed the results, Dx and Tx plan with the pt(& family if present). He/she/they expressed understanding and agree(s) with the plan. Discharge instructions discussed at great length. Strict return precautions discussed and pt &/or family have verbalized understanding of the instructions. No further questions at time of discharge.    New Prescriptions   No medications on file    Follow Up: [name], MD [place] [address] [facility] [address]     [address] [place] [address] [phone]       Tegeler, Gwenyth Allegra, MD 10/17/17 2252

## 2017-10-17 NOTE — ED Notes (Signed)
Urine is at bedside along with I-Stat.

## 2017-10-17 NOTE — ED Notes (Signed)
EKG presented to Dr. Sherry Ruffing.

## 2017-10-18 DIAGNOSIS — Z743 Need for continuous supervision: Secondary | ICD-10-CM | POA: Diagnosis not present

## 2017-10-18 DIAGNOSIS — R279 Unspecified lack of coordination: Secondary | ICD-10-CM | POA: Diagnosis not present

## 2017-10-28 ENCOUNTER — Emergency Department (HOSPITAL_COMMUNITY): Payer: Medicare Other

## 2017-10-28 ENCOUNTER — Encounter (HOSPITAL_COMMUNITY): Payer: Self-pay | Admitting: Emergency Medicine

## 2017-10-28 ENCOUNTER — Emergency Department (HOSPITAL_COMMUNITY)
Admission: EM | Admit: 2017-10-28 | Discharge: 2017-10-28 | Disposition: A | Discharge status: Home / Self Care | Payer: Medicare Other | Attending: Emergency Medicine | Admitting: Emergency Medicine

## 2017-10-28 DIAGNOSIS — Z79899 Other long term (current) drug therapy: Secondary | ICD-10-CM | POA: Diagnosis not present

## 2017-10-28 DIAGNOSIS — E119 Type 2 diabetes mellitus without complications: Secondary | ICD-10-CM | POA: Diagnosis not present

## 2017-10-28 DIAGNOSIS — R079 Chest pain, unspecified: Secondary | ICD-10-CM | POA: Diagnosis not present

## 2017-10-28 DIAGNOSIS — F17210 Nicotine dependence, cigarettes, uncomplicated: Secondary | ICD-10-CM | POA: Diagnosis not present

## 2017-10-28 DIAGNOSIS — J449 Chronic obstructive pulmonary disease, unspecified: Secondary | ICD-10-CM | POA: Insufficient documentation

## 2017-10-28 DIAGNOSIS — Z7982 Long term (current) use of aspirin: Secondary | ICD-10-CM | POA: Diagnosis not present

## 2017-10-28 DIAGNOSIS — R109 Unspecified abdominal pain: Secondary | ICD-10-CM | POA: Diagnosis not present

## 2017-10-28 DIAGNOSIS — R0602 Shortness of breath: Secondary | ICD-10-CM | POA: Diagnosis not present

## 2017-10-28 DIAGNOSIS — I110 Hypertensive heart disease with heart failure: Secondary | ICD-10-CM | POA: Diagnosis not present

## 2017-10-28 DIAGNOSIS — J209 Acute bronchitis, unspecified: Secondary | ICD-10-CM | POA: Diagnosis not present

## 2017-10-28 DIAGNOSIS — I5032 Chronic diastolic (congestive) heart failure: Secondary | ICD-10-CM | POA: Diagnosis not present

## 2017-10-28 DIAGNOSIS — R061 Stridor: Secondary | ICD-10-CM | POA: Diagnosis not present

## 2017-10-28 DIAGNOSIS — E1165 Type 2 diabetes mellitus with hyperglycemia: Secondary | ICD-10-CM | POA: Diagnosis not present

## 2017-10-28 DIAGNOSIS — R05 Cough: Secondary | ICD-10-CM | POA: Diagnosis not present

## 2017-10-28 DIAGNOSIS — J40 Bronchitis, not specified as acute or chronic: Secondary | ICD-10-CM | POA: Diagnosis not present

## 2017-10-28 DIAGNOSIS — R0789 Other chest pain: Secondary | ICD-10-CM | POA: Diagnosis not present

## 2017-10-28 LAB — CBC WITH DIFFERENTIAL/PLATELET
ABS IMMATURE GRANULOCYTES: 0 10*3/uL (ref 0.0–0.1)
Basophils Absolute: 0 10*3/uL (ref 0.0–0.1)
Basophils Relative: 0 %
Eosinophils Absolute: 0.1 10*3/uL (ref 0.0–0.7)
Eosinophils Relative: 1 %
HEMATOCRIT: 35.6 % — AB (ref 39.0–52.0)
Hemoglobin: 11.2 g/dL — ABNORMAL LOW (ref 13.0–17.0)
IMMATURE GRANULOCYTES: 0 %
LYMPHS ABS: 0.7 10*3/uL (ref 0.7–4.0)
LYMPHS PCT: 10 %
MCH: 30.1 pg (ref 26.0–34.0)
MCHC: 31.5 g/dL (ref 30.0–36.0)
MCV: 95.7 fL (ref 78.0–100.0)
MONOS PCT: 11 %
Monocytes Absolute: 0.7 10*3/uL (ref 0.1–1.0)
NEUTROS ABS: 5.2 10*3/uL (ref 1.7–7.7)
NEUTROS PCT: 78 %
Platelets: 232 10*3/uL (ref 150–400)
RBC: 3.72 MIL/uL — ABNORMAL LOW (ref 4.22–5.81)
RDW: 14.8 % (ref 11.5–15.5)
WBC: 6.7 10*3/uL (ref 4.0–10.5)

## 2017-10-28 LAB — I-STAT TROPONIN, ED: Troponin i, poc: 0 ng/mL (ref 0.00–0.08)

## 2017-10-28 LAB — COMPREHENSIVE METABOLIC PANEL
ALT: 26 U/L (ref 0–44)
ANION GAP: 11 (ref 5–15)
AST: 27 U/L (ref 15–41)
Albumin: 3.1 g/dL — ABNORMAL LOW (ref 3.5–5.0)
Alkaline Phosphatase: 90 U/L (ref 38–126)
BUN: 12 mg/dL (ref 8–23)
CHLORIDE: 104 mmol/L (ref 98–111)
CO2: 24 mmol/L (ref 22–32)
Calcium: 8.7 mg/dL — ABNORMAL LOW (ref 8.9–10.3)
Creatinine, Ser: 0.96 mg/dL (ref 0.61–1.24)
GFR calc non Af Amer: >60 mL/min (ref >60)
Glucose, Bld: 146 mg/dL — ABNORMAL HIGH (ref 70–99)
Potassium: 4 mmol/L (ref 3.5–5.1)
SODIUM: 139 mmol/L (ref 135–145)
Total Bilirubin: 0.3 mg/dL (ref 0.3–1.2)
Total Protein: 5.8 g/dL — ABNORMAL LOW (ref 6.5–8.1)

## 2017-10-28 LAB — D-DIMER, QUANTITATIVE (NOT AT ARMC)

## 2017-10-28 LAB — CBG MONITORING, ED
GLUCOSE-CAPILLARY: 104 mg/dL — AB (ref 70–99)
Glucose-Capillary: 76 mg/dL (ref 70–99)

## 2017-10-28 LAB — LIPASE, BLOOD: Lipase: 38 U/L (ref 11–51)

## 2017-10-28 MED ORDER — IPRATROPIUM-ALBUTEROL 0.5-2.5 (3) MG/3ML IN SOLN
3.0000 mL | Freq: Once | RESPIRATORY_TRACT | Status: AC
  Administered 2017-10-28: 3 mL via RESPIRATORY_TRACT
  Filled 2017-10-28: qty 3

## 2017-10-28 MED ORDER — DEXAMETHASONE 4 MG PO TABS
10.0000 mg | ORAL_TABLET | Freq: Once | ORAL | Status: AC
  Administered 2017-10-28: 10 mg via ORAL
  Filled 2017-10-28: qty 3

## 2017-10-28 MED ORDER — ALBUTEROL SULFATE HFA 108 (90 BASE) MCG/ACT IN AERS
1.0000 | INHALATION_SPRAY | RESPIRATORY_TRACT | Status: DC | PRN
  Administered 2017-10-28: 2 via RESPIRATORY_TRACT
  Filled 2017-10-28: qty 6.7

## 2017-10-28 NOTE — Discharge Instructions (Addendum)
You can use the albuterol inhaler, 1-2 puffs every 4-6 hours as needed for cough/wheeze

## 2017-10-28 NOTE — ED Triage Notes (Addendum)
Pt to ER for cough x2-3 weeks, states was seen by PCP 2 weeks ago and given diagnosis of bronchitis (documented hx of COPD noted). Pt reports productive cough with white/yellow sputum production and pleuritic chest pain with coughing. Pt in NAD at this time, ambulatory from EMS stretcher to bed. A/o x4.

## 2017-10-28 NOTE — ED Provider Notes (Signed)
[facility] EMERGENCY DEPARTMENT Provider Note   CSN: [id number] Arrival date & time: 10/28/17  1050     History   Chief Complaint Chief Complaint  Patient presents with  . Cough    HPI [name] is a 77 y.o. male.  The history is provided by the patient. No language interpreter was used.  Cough    [name] is a 77 y.o. male who presents to the Emergency Department complaining of cough. He presents to the emergency department from home complaining of cough for the last 2 1/2 weeks. He states that he is a cough with shortness of breath and dyspnea on exertion. Cough is productive of gray and clear sputum. He denies any chest pain, fevers, abdominal pain, vomiting, leg swelling or pain, dysuria. He does endorse some pain throughout his back just beneath his rib cage bilaterally. This pain is constant nature with no clear alleviating or worsening factors. The pain is been present for the last few days. He states that he saw his PCP 10 days ago for similar symptoms and was told he had bronchitis and has not been started on any medications.  Past Medical History:  Diagnosis Date  . Anemia    takes Ferrous Sulfate daily  . Arthritis    "all over"  . Balance problem 01/2014  . CAP (community acquired pneumonia) 09/18/2014  . Cervical radiculopathy due to degenerative joint disease of spine   . COPD (chronic obstructive pulmonary disease) ([place])   . Coronary artery disease, non-occlusive    a. 03/2010 Nonocclusive disease by cath, performed for ST elevations on ECG;  b. 06/2013 Lexi MV: EF 60%, no ischemia.  . Diabetes mellitus type II    takes Metformin and Lantus daily  . Diastolic CHF, chronic ([place])    a. 12/2012 EF 55-60%, diast dysfxn, triv MR, mildly dil LA/RA.  [name] History of blood transfusion 1982   "when I had stomach OR"  . History of bronchitis    1998  . History of gastric ulcer   . HTN (hypertension)    takes Diltiazem daily  .  Hyperlipidemia    takes Pravastatin daily  . Joint pain   . PAF (paroxysmal atrial fibrillation) ([facility])    Recurrent after atrial flutter (a. 07/2010 Status post caval tricuspid isthmus ablation by Dr. Midge Aver Metoprolol daily), currently controlled on flecainide plus diltiazem and Coumadin  . Weakness    numbness and tingling both hands    Patient Active Problem List   Diagnosis Date Noted  . Chest pain with moderate risk of acute coronary syndrome 10/04/2017  . HTN (hypertension) 05/21/2017  . Diabetes mellitus type II 05/21/2017  . Diastolic CHF, chronic ([place]) 05/21/2017  . COPD (chronic obstructive pulmonary disease) ([place]) 05/21/2017  . H/O atrial flutter 05/21/2017  . Coronary disease/nonobstructive 05/21/2017  . Acute blood loss anemia 10/02/2016  . Abdominal pain 10/02/2016  . Constipation 10/02/2016  . Left lower quadrant pain   . Coagulopathy ([place])   . Heme positive stool   . Symptomatic anemia 07/10/2016  . Syncope 05/15/2015  . Cervical disc disease 05/15/2015  . Abnormal urinalysis 05/15/2015  . Anemia 05/15/2015  . History of CVA (cerebrovascular accident) 04/03/2014  . Near syncope 02/15/2014  . Pre-syncope 02/15/2014  . Benign neoplasm of rectum and anal canal 12/02/2013  . Upper GI bleeding 11/30/2013  . Tobacco use disorder 11/30/2013  . Anticoagulation goal of INR 2 to 3, for PAF - CHA2DS2Vasc = 7 08/15/2013  .  Type 2 diabetes mellitus ([place]) 01/22/2013  . Tobacco abuse counseling 12/15/2012  . Hyperlipidemia   . Obesity (BMI 30-39.9) 12/14/2012  . Atrial flutter - status post CTI ablation 07/29/2010  . Essential hypertension 07/29/2010  . Chronic diastolic congestive heart failure ([place]) 07/29/2010  . Coronary artery disease, non-occlusive 07/29/2010    Past Surgical History:  Procedure Laterality Date  . ATRIAL ABLATION SURGERY  08/05/10   CTI ablation for atrial flutter by JA  . CARDIAC CATHETERIZATION  2012   nl LV function, no occlusive CAD,  PAF  . CARDIOVERSION  12/07/2010    Successful direct current cardioversion with atrial fibrillation to normal sinus rhythm  . CARPAL TUNNEL RELEASE Bilateral 01/30/2014   Procedure: BILATERAL CARPAL TUNNEL RELEASE;  Surgeon: Marianna Payment, MD;  Location: [facility];  Service: Orthopedics;  Laterality: Bilateral;  . CATARACT EXTRACTION W/ INTRAOCULAR LENS  IMPLANT, BILATERAL Bilateral   . COLONOSCOPY [name] 12/02/2013   Procedure: COLONOSCOPY;  Surgeon: Irene Shipper, MD;  Location: [facility];  Service: Endoscopy;  Laterality: [name];  . ESOPHAGOGASTRODUODENOSCOPY [name] 09/22/2014   Procedure: ESOPHAGOGASTRODUODENOSCOPY (EGD);  Surgeon: Ronald Lobo, MD;  Location: [facility] ENDOSCOPY;  Service: Endoscopy;  Laterality: [name];  . ESOPHAGOGASTRODUODENOSCOPY [name] 07/11/2016   Procedure: ESOPHAGOGASTRODUODENOSCOPY (EGD);  Surgeon: Doran Stabler, MD;  Location: [facility] ENDOSCOPY;  Service: Endoscopy;  Laterality: [name];  . INCISION AND DRAINAGE ABSCESS / HEMATOMA OF BURSA / KNEE / THIGH Left 1998   knee  . KNEE BURSECTOMY Left 1998  . LAPAROSCOPIC CHOLECYSTECTOMY  03/2010  . LEFT HEART CATH AND CORONARY ANGIOGRAPHY [name] 10/05/2017   Procedure: LEFT HEART CATH AND CORONARY ANGIOGRAPHY;  Surgeon: Martinique, Peter M, MD;  Location: [facility] CV LAB;  Service: Cardiovascular;  Laterality: [name];  . NM MYOVIEW LTD  07/22/2013   Normal EF ~60%, no ischemia or infarction.  [name] PARTIAL GASTRECTOMY  1982   subtotal; "took out 30% for ulcers"  . TRANSTHORACIC ECHOCARDIOGRAM  02/16/2014   EF 60%, no RWMA. - otherwise normal  . YAG LASER APPLICATION Bilateral         Home Medications    Prior to Admission medications   Medication Sig Start Date End Date Taking? Authorizing Provider  aspirin EC 81 MG tablet Take 81 mg by mouth daily.   Yes [provider]  co-enzyme Q-10 30 MG capsule Take 30 mg by mouth daily.    Yes [provider]  diltiazem (CARDIZEM CD) 120 MG 24 hr capsule Take 1 capsule (120 mg total) by  mouth daily. 05/04/17  Yes Leonie Man, MD  flecainide (TAMBOCOR) 150 MG tablet Take 1.5 tablets (225 mg total) by mouth 2 (two) times daily. 05/04/17  Yes Leonie Man, MD  HYDROcodone-acetaminophen (NORCO/VICODIN) 5-325 MG tablet Take 1 tablet by mouth every 8 (eight) hours as needed for moderate pain.   Yes [provider]  hydrocortisone cream 1 % Apply 1 application topically 2 (two) times daily.  01/02/17  Yes [provider]  Insulin Glargine (LANTUS) 100 UNIT/ML Solostar Pen Inject 8 Units into the skin 2 (two) times daily. 11/30/16  Yes Domenic Polite, MD  metFORMIN (GLUCOPHAGE) 1000 MG tablet Take 1,000 mg by mouth 2 (two) times daily with a meal.    Yes [provider]  metoprolol succinate (TOPROL-XL) 25 MG 24 hr tablet TAKE 1/2 TABLET  (12.5 MG TOTAL) BY MOUTH DAILY. Patient taking differently: Take 12.5 mg by mouth every evening.  05/04/17  Yes Leonie Man, MD  Multiple Vitamin (MULTIVITAMIN WITH MINERALS) TABS tablet Take 1 tablet by mouth 2 (two) times daily.    Yes [provider]  nitroGLYCERIN (NITROSTAT) 0.4 MG SL tablet Place 1 tablet (0.4 mg total) under the tongue every 5 (five) minutes x 3 doses as needed for chest pain. 11/19/15  Yes Kilroy, Luke K, PA-C  pantoprazole (PROTONIX) 40 MG tablet Take 1 tablet (40 mg total) by mouth 2 (two) times daily before a meal. 11/30/16  Yes Domenic Polite, MD  polyethylene glycol (MIRALAX / GLYCOLAX) packet Take 17 g by mouth 2 (two) times daily. Patient taking differently: Take 17 g by mouth as needed for moderate constipation.  10/03/16  Yes Sheikh, Omair Latif, DO  pravastatin (PRAVACHOL) 40 MG tablet Take 1 tablet (40 mg total) by mouth every evening. 05/04/17  Yes Leonie Man, MD  tamsulosin (FLOMAX) 0.4 MG CAPS capsule Take 0.4 mg by mouth daily. 02/20/17  Yes [provider]    Family History Family History  Problem Relation Age of Onset  . Cancer Mother   . Heart attack Father    . Heart attack Brother     Social History Social History   Tobacco Use  . Smoking status: Current Every Day Smoker    Packs/day: 0.50    Years: 57.00    Pack years: 28.50    Types: Cigarettes  . Smokeless tobacco: Never Used  . Tobacco comment: He's been smoking between 0.5 and 2 ppd since age 44.  Substance Use Topics  . Alcohol use: No    Alcohol/week: 0.0 oz    Comment: "quit drinking in 1986"  . Drug use: No     Allergies   Cyclobenzaprine   Review of Systems Review of Systems  Respiratory: Positive for cough.   All other systems reviewed and are negative.    Physical Exam Updated Vital Signs BP (!) 116/55   Pulse 84   Temp 98.2 F (36.8 C) (Oral)   Resp 18   SpO2 98%   Physical Exam  Constitutional: He is oriented to person, place, and time. He appears well-developed and well-nourished.  HENT:  Head: Normocephalic and atraumatic.  Cardiovascular: Normal rate and regular rhythm.  No murmur heard. Pulmonary/Chest: Effort normal. No respiratory distress.  Rhonchi and bilateral bases  Abdominal: Soft. There is no rebound and no guarding.  Mild generalized abdominal tenderness  Musculoskeletal: He exhibits no edema or tenderness.  Neurological: He is alert and oriented to person, place, and time.  Skin: Skin is warm and dry.  Psychiatric: He has a normal mood and affect. His behavior is normal.  Nursing note and vitals reviewed.    ED Treatments / Results  Labs (all labs ordered are listed, but only abnormal results are displayed) Labs Reviewed  COMPREHENSIVE METABOLIC PANEL - Abnormal; Notable for the following components:      Result Value   Glucose, Bld 146 (*)    Calcium 8.7 (*)    Total Protein 5.8 (*)    Albumin 3.1 (*)    All other components within normal limits  CBC WITH DIFFERENTIAL/PLATELET - Abnormal; Notable for the following components:   RBC 3.72 (*)    Hemoglobin 11.2 (*)    HCT 35.6 (*)    All other components within normal  limits  CBG MONITORING, ED - Abnormal; Notable for the following components:   Glucose-Capillary 104 (*)    All other components within normal limits  LIPASE, BLOOD  D-DIMER, QUANTITATIVE (NOT AT [facility])  I-STAT TROPONIN, ED  CBG MONITORING, ED    EKG None  Radiology Dg Chest 2 View  Result Date: 10/28/2017 CLINICAL DATA:  Cough and shortness of breath for 3 weeks. EXAM: CHEST - 2 VIEW COMPARISON:  10/17/2017 FINDINGS: The heart size and mediastinal contours are within normal limits. Aortic atherosclerosis. Both lungs are clear. No evidence of pleural effusion. Surgical clips noted in the epigastric region. Mild mid and lower thoracic spine degenerative disc disease incidentally noted. IMPRESSION: Stable exam.  No active cardiopulmonary disease. Electronically Signed   By: Earle Gell M.D.   On: 10/28/2017 12:31    Procedures Procedures (including critical care time)  Medications Ordered in ED Medications  albuterol (PROVENTIL HFA;VENTOLIN HFA) 108 (90 Base) MCG/ACT inhaler 1-2 puff (2 puffs Inhalation Given 10/28/17 1537)  ipratropium-albuterol (DUONEB) 0.5-2.5 (3) MG/3ML nebulizer solution 3 mL (3 mLs Nebulization Given 10/28/17 1136)  dexamethasone (DECADRON) tablet 10 mg (10 mg Oral Given 10/28/17 1318)  ipratropium-albuterol (DUONEB) 0.5-2.5 (3) MG/3ML nebulizer solution 3 mL (3 mLs Nebulization Given 10/28/17 1309)     Initial Impression / Assessment and Plan / ED Course  I have reviewed the triage vital signs and the nursing notes.  Pertinent labs & imaging results that were available during my care of the patient were reviewed by me and considered in my medical decision making (see chart for details).     Patient here for evaluation of two and half weeks of progressive cough. He is non-toxic appearing on examination with no respiratory distress. On initial evaluation he did have rhonchi bilaterally. After single albuterol treatment in the rhonchi resolved. He has no hypoxia in the  department and is able to ambulate without difficulty. No current evidence of pneumonia, CHF, PE. Discussed with patient home care for bronchitis, possible reactive airway component. Recommend albuterol - inhaler provided in the department. Providing one-time dose of steroids. Discussed outpatient follow-up and return precautions.  Final Clinical Impressions(s) / ED Diagnoses   Final diagnoses:  Bronchitis    ED Discharge Orders    None       Quintella Reichert, MD 10/28/17 1554

## 2017-10-28 NOTE — ED Notes (Signed)
Pt ambulated in hallway with assistance per baseline. O2 sat improved from 95% resting to 98% while ambulating. Unsteady gait noted. Pt stated he felt weak due to not having eaten today. Pt stated he is a diabetic and "this weakness happens when I don't eat." Denies dizziness, shortness of breath.

## 2017-10-29 ENCOUNTER — Emergency Department (HOSPITAL_COMMUNITY)
Admission: EM | Admit: 2017-10-29 | Discharge: 2017-10-30 | Disposition: A | Discharge status: Home / Self Care | Payer: Medicare Other | Attending: Emergency Medicine | Admitting: Emergency Medicine

## 2017-10-29 ENCOUNTER — Encounter (HOSPITAL_COMMUNITY): Payer: Self-pay | Admitting: Emergency Medicine

## 2017-10-29 ENCOUNTER — Other Ambulatory Visit: Payer: Self-pay

## 2017-10-29 DIAGNOSIS — Z7982 Long term (current) use of aspirin: Secondary | ICD-10-CM | POA: Diagnosis not present

## 2017-10-29 DIAGNOSIS — I2510 Atherosclerotic heart disease of native coronary artery without angina pectoris: Secondary | ICD-10-CM | POA: Insufficient documentation

## 2017-10-29 DIAGNOSIS — J9801 Acute bronchospasm: Secondary | ICD-10-CM | POA: Diagnosis not present

## 2017-10-29 DIAGNOSIS — R05 Cough: Secondary | ICD-10-CM | POA: Diagnosis not present

## 2017-10-29 DIAGNOSIS — I110 Hypertensive heart disease with heart failure: Secondary | ICD-10-CM | POA: Diagnosis not present

## 2017-10-29 DIAGNOSIS — Z79899 Other long term (current) drug therapy: Secondary | ICD-10-CM | POA: Diagnosis not present

## 2017-10-29 DIAGNOSIS — J209 Acute bronchitis, unspecified: Secondary | ICD-10-CM

## 2017-10-29 DIAGNOSIS — E119 Type 2 diabetes mellitus without complications: Secondary | ICD-10-CM | POA: Insufficient documentation

## 2017-10-29 DIAGNOSIS — Z794 Long term (current) use of insulin: Secondary | ICD-10-CM | POA: Insufficient documentation

## 2017-10-29 DIAGNOSIS — I5032 Chronic diastolic (congestive) heart failure: Secondary | ICD-10-CM | POA: Insufficient documentation

## 2017-10-29 DIAGNOSIS — J449 Chronic obstructive pulmonary disease, unspecified: Secondary | ICD-10-CM | POA: Diagnosis not present

## 2017-10-29 DIAGNOSIS — F17210 Nicotine dependence, cigarettes, uncomplicated: Secondary | ICD-10-CM | POA: Insufficient documentation

## 2017-10-29 MED ORDER — ALBUTEROL SULFATE (2.5 MG/3ML) 0.083% IN NEBU
INHALATION_SOLUTION | RESPIRATORY_TRACT | Status: AC
  Filled 2017-10-29: qty 3

## 2017-10-29 MED ORDER — ALBUTEROL SULFATE (2.5 MG/3ML) 0.083% IN NEBU
5.0000 mg | INHALATION_SOLUTION | Freq: Once | RESPIRATORY_TRACT | Status: AC
  Administered 2017-10-29: 5 mg via RESPIRATORY_TRACT

## 2017-10-29 NOTE — ED Provider Notes (Signed)
Patient placed in Quick Look pathway, seen and evaluated   Chief Complaint: shortness of breath  HPI:   Raja Liska is a 77 y.o. male who presents to the ED with cough and wheezing. Patient reports he was here yesterday with same symptoms and given breathing treatment and prednisone. He was given an albuterol inhaler to go home with. Patient reports he was not given any Rx.   ROS: Resp: cough and wheezing  Physical Exam:  BP 129/60 (BP Location: Right Arm)   Pulse 97   Temp 98.5 F (36.9 C) (Oral)   Resp 18   Ht 5\' 6"  (1.676 m)   Wt 79.4 kg (175 lb)   SpO2 100%   BMI 28.25 kg/m    Gen: No distress  Neuro: Awake and Alert  Skin: Warm and dry  Resp: wheezing and decreased breath sounds.     Initiation of care has begun. The patient has been counseled on the process, plan, and necessity for staying for the completion/evaluation, and the remainder of the medical screening examination    Ashley Murrain, NP 10/29/17 Tira, Ankit, MD 10/31/17 1246

## 2017-10-29 NOTE — ED Triage Notes (Signed)
Pt reports he was here yesterday for bronchitis. Pt states that everybody told him his bronchitis was bad but that he didn't get any medications for it. This RN reviewed pt's d/c paperwork and explained the inhaler use to him but pt states it stopped working. Pt reports they were going to give him steroids but that they didn't give him the prescription. No prescription for steroids noted on AVS. Pt continuing to have cough and sore throat. Denies sob. Pt reports continuing back pain as well.

## 2017-10-30 ENCOUNTER — Encounter (HOSPITAL_COMMUNITY): Payer: Self-pay | Admitting: Emergency Medicine

## 2017-10-30 MED ORDER — ALBUTEROL SULFATE HFA 108 (90 BASE) MCG/ACT IN AERS: 2.0000 | INHALATION_SPRAY | RESPIRATORY_TRACT | 0 refills | Status: DC | PRN

## 2017-10-30 MED ORDER — PREDNISONE 20 MG PO TABS
60.0000 mg | ORAL_TABLET | Freq: Once | ORAL | Status: AC
  Administered 2017-10-30: 60 mg via ORAL
  Filled 2017-10-30: qty 3

## 2017-10-30 MED ORDER — PREDNISONE 20 MG PO TABS: ORAL_TABLET | ORAL | 0 refills | Status: DC

## 2017-10-30 MED ORDER — BENZONATATE 100 MG PO CAPS: 100.0000 mg | ORAL_CAPSULE | Freq: Three times a day (TID) | ORAL | 0 refills | Status: DC

## 2017-10-30 MED ORDER — IPRATROPIUM-ALBUTEROL 0.5-2.5 (3) MG/3ML IN SOLN
3.0000 mL | Freq: Once | RESPIRATORY_TRACT | Status: AC
  Administered 2017-10-30: 3 mL via RESPIRATORY_TRACT
  Filled 2017-10-30: qty 3

## 2017-10-30 NOTE — ED Notes (Signed)
Patient verbalizes understanding of medications and discharge instructions. No further questions at this time. VSS and patient ambulatory at discharge.   

## 2017-10-30 NOTE — ED Provider Notes (Signed)
[facility] EMERGENCY DEPARTMENT Provider Note   CSN: [id number] Arrival date & time: 10/29/17  1836     History   Chief Complaint Chief Complaint  Patient presents with  . Bronchitis    HPI [name] is a 77 y.o. male.  The history is provided by the patient.  Wheezing   This is a recurrent problem. The current episode started 12 to 24 hours ago. The problem occurs constantly. The problem has not changed since onset.Associated symptoms include cough. Pertinent negatives include no chest pain, no fever, no abdominal pain, no vomiting, no dysuria, no coryza, no ear pain, no headaches, no rhinorrhea, no sore throat, no swollen glands, no neck pain, no hemoptysis, no sputum production and no rash. The problem's precipitants include smoke. He has tried nothing for the symptoms. The treatment provided no relief. He has had prior hospitalizations. He has had prior ED visits. His past medical history is significant for COPD.  Seen yesterday and was ruled out for MI and PE and wanted RX for meds but did not get them and the small inhaler he was given is reportedly not working and he presents with continued wheezing wanting RX for steroids and inhaler.    Past Medical History:  Diagnosis Date  . Anemia    takes Ferrous Sulfate daily  . Arthritis    "all over"  . Balance problem 01/2014  . CAP (community acquired pneumonia) 09/18/2014  . Cervical radiculopathy due to degenerative joint disease of spine   . COPD (chronic obstructive pulmonary disease) ([place])   . Coronary artery disease, non-occlusive    a. 03/2010 Nonocclusive disease by cath, performed for ST elevations on ECG;  b. 06/2013 Lexi MV: EF 60%, no ischemia.  . Diabetes mellitus type II    takes Metformin and Lantus daily  . Diastolic CHF, chronic ([place])    a. 12/2012 EF 55-60%, diast dysfxn, triv MR, mildly dil LA/RA.  [name] History of blood transfusion 1982   "when I had stomach OR"  . History of bronchitis     1998  . History of gastric ulcer   . HTN (hypertension)    takes Diltiazem daily  . Hyperlipidemia    takes Pravastatin daily  . Joint pain   . PAF (paroxysmal atrial fibrillation) (H[facility])    Recurrent after atrial flutter (a. 07/2010 Status post caval tricuspid isthmus ablation by Dr. Midge Aver Metoprolol daily), currently controlled on flecainide plus diltiazem and Coumadin  . Weakness    numbness and tingling both hands    Patient Active Problem List   Diagnosis Date Noted  . Chest pain with moderate risk of acute coronary syndrome 10/04/2017  . HTN (hypertension) 05/21/2017  . Diabetes mellitus type II 05/21/2017  . Diastolic CHF, chronic ([place]) 05/21/2017  . COPD (chronic obstructive pulmonary disease) ([place]) 05/21/2017  . H/O atrial flutter 05/21/2017  . Coronary disease/nonobstructive 05/21/2017  . Acute blood loss anemia 10/02/2016  . Abdominal pain 10/02/2016  . Constipation 10/02/2016  . Left lower quadrant pain   . Coagulopathy ([place])   . Heme positive stool   . Symptomatic anemia 07/10/2016  . Syncope 05/15/2015  . Cervical disc disease 05/15/2015  . Abnormal urinalysis 05/15/2015  . Anemia 05/15/2015  . History of CVA (cerebrovascular accident) 04/03/2014  . Near syncope 02/15/2014  . Pre-syncope 02/15/2014  . Benign neoplasm of rectum and anal canal 12/02/2013  . Upper GI bleeding 11/30/2013  . Tobacco use disorder 11/30/2013  . Anticoagulation goal of INR  2 to 3, for PAF - CHA2DS2Vasc = 7 08/15/2013  . Type 2 diabetes mellitus ([place]) 01/22/2013  . Tobacco abuse counseling 12/15/2012  . Hyperlipidemia   . Obesity (BMI 30-39.9) 12/14/2012  . Atrial flutter - status post CTI ablation 07/29/2010  . Essential hypertension 07/29/2010  . Chronic diastolic congestive heart failure ([place]) 07/29/2010  . Coronary artery disease, non-occlusive 07/29/2010    Past Surgical History:  Procedure Laterality Date  . ATRIAL ABLATION SURGERY  08/05/10   CTI ablation for  atrial flutter by JA  . CARDIAC CATHETERIZATION  2012   nl LV function, no occlusive CAD, PAF  . CARDIOVERSION  12/07/2010    Successful direct current cardioversion with atrial fibrillation to normal sinus rhythm  . CARPAL TUNNEL RELEASE Bilateral 01/30/2014   Procedure: BILATERAL CARPAL TUNNEL RELEASE;  Surgeon: Marianna Payment, MD;  Location: [facility];  Service: Orthopedics;  Laterality: Bilateral;  . CATARACT EXTRACTION W/ INTRAOCULAR LENS  IMPLANT, BILATERAL Bilateral   . COLONOSCOPY [name] 12/02/2013   Procedure: COLONOSCOPY;  Surgeon: Irene Shipper, MD;  Location: [facility];  Service: Endoscopy;  Laterality: [name];  . ESOPHAGOGASTRODUODENOSCOPY [name] 09/22/2014   Procedure: ESOPHAGOGASTRODUODENOSCOPY (EGD);  Surgeon: Ronald Lobo, MD;  Location: [facility] ENDOSCOPY;  Service: Endoscopy;  Laterality: [name];  . ESOPHAGOGASTRODUODENOSCOPY [name] 07/11/2016   Procedure: ESOPHAGOGASTRODUODENOSCOPY (EGD);  Surgeon: Doran Stabler, MD;  Location: [facility] ENDOSCOPY;  Service: Endoscopy;  Laterality: [name];  . INCISION AND DRAINAGE ABSCESS / HEMATOMA OF BURSA / KNEE / THIGH Left 1998   knee  . KNEE BURSECTOMY Left 1998  . LAPAROSCOPIC CHOLECYSTECTOMY  03/2010  . LEFT HEART CATH AND CORONARY ANGIOGRAPHY [name] 10/05/2017   Procedure: LEFT HEART CATH AND CORONARY ANGIOGRAPHY;  Surgeon: Martinique, Peter M, MD;  Location: [facility] CV LAB;  Service: Cardiovascular;  Laterality: [name];  . NM MYOVIEW LTD  07/22/2013   Normal EF ~60%, no ischemia or infarction.  [name] PARTIAL GASTRECTOMY  1982   subtotal; "took out 30% for ulcers"  . TRANSTHORACIC ECHOCARDIOGRAM  02/16/2014   EF 60%, no RWMA. - otherwise normal  . YAG LASER APPLICATION Bilateral         Home Medications    Prior to Admission medications   Medication Sig Start Date End Date Taking? Authorizing Provider  albuterol (PROVENTIL HFA;VENTOLIN HFA) 108 (90 Base) MCG/ACT inhaler Inhale 2 puffs into the lungs every 4 (four) hours as needed for wheezing or shortness of  breath. 10/30/17   Laryn Venning, MD  aspirin EC 81 MG tablet Take 81 mg by mouth daily.    [provider]  benzonatate (TESSALON) 100 MG capsule Take 1 capsule (100 mg total) by mouth every 8 (eight) hours. 10/30/17   Murriel Eidem, MD  co-enzyme Q-10 30 MG capsule Take 30 mg by mouth daily.     [provider]  diltiazem (CARDIZEM CD) 120 MG 24 hr capsule Take 1 capsule (120 mg total) by mouth daily. 05/04/17   Leonie Man, MD  flecainide (TAMBOCOR) 150 MG tablet Take 1.5 tablets (225 mg total) by mouth 2 (two) times daily. 05/04/17   Leonie Man, MD  HYDROcodone-acetaminophen (NORCO/VICODIN) 5-325 MG tablet Take 1 tablet by mouth every 8 (eight) hours as needed for moderate pain.    [provider]  hydrocortisone cream 1 % Apply 1 application topically 2 (two) times daily.  01/02/17   [provider]  Insulin Glargine (LANTUS) 100 UNIT/ML Solostar Pen Inject 8 Units into the skin 2 (two)  times daily. 11/30/16   Domenic Polite, MD  metFORMIN (GLUCOPHAGE) 1000 MG tablet Take 1,000 mg by mouth 2 (two) times daily with a meal.     [provider]  metoprolol succinate (TOPROL-XL) 25 MG 24 hr tablet TAKE 1/2 TABLET  (12.5 MG TOTAL) BY MOUTH DAILY. Patient taking differently: Take 12.5 mg by mouth every evening.  05/04/17   Leonie Man, MD  Multiple Vitamin (MULTIVITAMIN WITH MINERALS) TABS tablet Take 1 tablet by mouth 2 (two) times daily.     [provider]  nitroGLYCERIN (NITROSTAT) 0.4 MG SL tablet Place 1 tablet (0.4 mg total) under the tongue every 5 (five) minutes x 3 doses as needed for chest pain. 11/19/15   Erlene Quan, PA-C  pantoprazole (PROTONIX) 40 MG tablet Take 1 tablet (40 mg total) by mouth 2 (two) times daily before a meal. 11/30/16   Domenic Polite, MD  polyethylene glycol [facility] / Floria Raveling) packet Take 17 g by mouth 2 (two) times daily. Patient taking differently: Take 17 g by mouth as needed for moderate constipation.   10/03/16   Raiford Noble Latif, DO  pravastatin (PRAVACHOL) 40 MG tablet Take 1 tablet (40 mg total) by mouth every evening. 05/04/17   Leonie Man, MD  predniSONE (DELTASONE) 20 MG tablet 3 tabs po day one, then 2 po daily x 4 days 10/30/17   Randal Buba, Shakyra Mattera, MD  tamsulosin (FLOMAX) 0.4 MG CAPS capsule Take 0.4 mg by mouth daily. 02/20/17   [provider]    Family History Family History  Problem Relation Age of Onset  . Cancer Mother   . Heart attack Father   . Heart attack Brother     Social History Social History   Tobacco Use  . Smoking status: Current Every Day Smoker    Packs/day: 0.50    Years: 57.00    Pack years: 28.50    Types: Cigarettes  . Smokeless tobacco: Never Used  . Tobacco comment: He's been smoking between 0.5 and 2 ppd since age 45.  Substance Use Topics  . Alcohol use: No    Alcohol/week: 0.0 oz    Comment: "quit drinking in 1986"  . Drug use: No     Allergies   Cyclobenzaprine   Review of Systems Review of Systems  Constitutional: Negative for diaphoresis and fever.  HENT: Negative for ear pain, rhinorrhea and sore throat.   Respiratory: Positive for cough and wheezing. Negative for hemoptysis, sputum production and chest tightness.   Cardiovascular: Negative for chest pain, palpitations and leg swelling.  Gastrointestinal: Negative for abdominal pain and vomiting.  Genitourinary: Negative for dysuria.  Musculoskeletal: Negative for neck pain.  Skin: Negative for rash.  Neurological: Negative for headaches.  All other systems reviewed and are negative.    Physical Exam Updated Vital Signs BP 118/60 (BP Location: Right Arm)   Pulse 82   Temp 98.2 F (36.8 C) (Oral)   Resp 20   Ht 5\' 6"  (1.676 m)   Wt 79.4 kg (175 lb)   SpO2 99%   BMI 28.25 kg/m   Physical Exam  Constitutional: He is oriented to person, place, and time. He appears well-developed and well-nourished. No distress.  HENT:  Head: Normocephalic and  atraumatic.  Mouth/Throat: No oropharyngeal exudate.  Eyes: Pupils are equal, round, and reactive to light. Conjunctivae are normal.  Neck: Normal range of motion. Neck supple.  Cardiovascular: Normal rate, regular rhythm, normal heart sounds and intact distal pulses.  Pulmonary/Chest: No  respiratory distress. He has wheezes.  Abdominal: Soft. Bowel sounds are normal. He exhibits no mass. There is no tenderness. There is no rebound and no guarding.  Musculoskeletal: Normal range of motion. He exhibits no edema or tenderness.  Neurological: He is alert and oriented to person, place, and time. He displays normal reflexes.  Skin: Skin is warm and dry. Capillary refill takes less than 2 seconds.  Psychiatric: He has a normal mood and affect.     ED Treatments / Results  Labs (all labs ordered are listed, but only abnormal results are displayed) Labs Reviewed - No data to display  EKG None  Radiology Dg Chest 2 View  Result Date: 10/28/2017 CLINICAL DATA:  Cough and shortness of breath for 3 weeks. EXAM: CHEST - 2 VIEW COMPARISON:  10/17/2017 FINDINGS: The heart size and mediastinal contours are within normal limits. Aortic atherosclerosis. Both lungs are clear. No evidence of pleural effusion. Surgical clips noted in the epigastric region. Mild mid and lower thoracic spine degenerative disc disease incidentally noted. IMPRESSION: Stable exam.  No active cardiopulmonary disease. Electronically Signed   By: Earle Gell M.D.   On: 10/28/2017 12:31    Procedures Procedures (including critical care time)  Medications Ordered in ED Medications  albuterol (PROVENTIL) (2.5 MG/3ML) 0.083% nebulizer solution (has no administration in time range)  albuterol (PROVENTIL) (2.5 MG/3ML) 0.083% nebulizer solution 5 mg (5 mg Nebulization Given 10/29/17 1856)  ipratropium-albuterol (DUONEB) 0.5-2.5 (3) MG/3ML nebulizer solution 3 mL (3 mLs Nebulization Given 10/30/17 0120)  predniSONE (DELTASONE) tablet 60  mg (60 mg Oral Given 10/30/17 0120)       Final Clinical Impressions(s) / ED Diagnoses   Final diagnoses:  Acute bronchitis with bronchospasm   Return for pain, numbness, changes in vision or speech, fevers >100.4 unrelieved by medication, shortness of breath, intractable vomiting, or diarrhea, abdominal pain, Inability to tolerate liquids or food, cough, altered mental status or any concerns. No signs of systemic illness or infection. The patient is nontoxic-appearing on exam and vital signs are within normal limits. Will refer to urology for microscopy hematuria as patient is asymptomatic.  I have reviewed the triage vital signs and the nursing notes. Pertinent labs &imaging results that were available during my care of the patient were reviewed by me and considered in my medical decision making (see chart for details).  After history, exam, and medical workup I feel the patient has been appropriately medically screened and is safe for discharge home. Pertinent diagnoses were discussed with the patient. Patient was given return precautions.   ED Discharge Orders        Ordered    predniSONE (DELTASONE) 20 MG tablet     10/30/17 0234    albuterol (PROVENTIL HFA;VENTOLIN HFA) 108 (90 Base) MCG/ACT inhaler  Every 4 hours PRN     10/30/17 0234    benzonatate (TESSALON) 100 MG capsule  Every 8 hours     10/30/17 0234       Crystie Yanko, MD 10/30/17 2130

## 2017-11-04 ENCOUNTER — Emergency Department (HOSPITAL_COMMUNITY)
Admission: EM | Admit: 2017-11-04 | Discharge: 2017-11-04 | Disposition: A | Discharge status: Home / Self Care | Payer: Medicare Other | Source: Home / Self Care | Attending: Emergency Medicine | Admitting: Emergency Medicine

## 2017-11-04 ENCOUNTER — Encounter (HOSPITAL_COMMUNITY): Payer: Self-pay | Admitting: Emergency Medicine

## 2017-11-04 ENCOUNTER — Emergency Department (HOSPITAL_COMMUNITY): Payer: Medicare Other

## 2017-11-04 ENCOUNTER — Emergency Department (HOSPITAL_COMMUNITY)
Admission: EM | Admit: 2017-11-04 | Discharge: 2017-11-04 | Disposition: A | Discharge status: Home / Self Care | Payer: Medicare Other | Attending: Emergency Medicine | Admitting: Emergency Medicine

## 2017-11-04 ENCOUNTER — Other Ambulatory Visit: Payer: Self-pay

## 2017-11-04 DIAGNOSIS — Z79899 Other long term (current) drug therapy: Secondary | ICD-10-CM

## 2017-11-04 DIAGNOSIS — E119 Type 2 diabetes mellitus without complications: Secondary | ICD-10-CM

## 2017-11-04 DIAGNOSIS — I499 Cardiac arrhythmia, unspecified: Secondary | ICD-10-CM | POA: Diagnosis not present

## 2017-11-04 DIAGNOSIS — Z7982 Long term (current) use of aspirin: Secondary | ICD-10-CM

## 2017-11-04 DIAGNOSIS — J449 Chronic obstructive pulmonary disease, unspecified: Secondary | ICD-10-CM

## 2017-11-04 DIAGNOSIS — Z794 Long term (current) use of insulin: Secondary | ICD-10-CM | POA: Insufficient documentation

## 2017-11-04 DIAGNOSIS — F17210 Nicotine dependence, cigarettes, uncomplicated: Secondary | ICD-10-CM | POA: Insufficient documentation

## 2017-11-04 DIAGNOSIS — I4892 Unspecified atrial flutter: Secondary | ICD-10-CM | POA: Insufficient documentation

## 2017-11-04 DIAGNOSIS — I213 ST elevation (STEMI) myocardial infarction of unspecified site: Secondary | ICD-10-CM | POA: Diagnosis not present

## 2017-11-04 DIAGNOSIS — I481 Persistent atrial fibrillation: Secondary | ICD-10-CM | POA: Diagnosis not present

## 2017-11-04 DIAGNOSIS — I2510 Atherosclerotic heart disease of native coronary artery without angina pectoris: Secondary | ICD-10-CM

## 2017-11-04 DIAGNOSIS — R000 Tachycardia, unspecified: Secondary | ICD-10-CM | POA: Diagnosis not present

## 2017-11-04 DIAGNOSIS — I5032 Chronic diastolic (congestive) heart failure: Secondary | ICD-10-CM

## 2017-11-04 DIAGNOSIS — I110 Hypertensive heart disease with heart failure: Secondary | ICD-10-CM

## 2017-11-04 DIAGNOSIS — I4891 Unspecified atrial fibrillation: Secondary | ICD-10-CM | POA: Diagnosis not present

## 2017-11-04 DIAGNOSIS — R002 Palpitations: Secondary | ICD-10-CM | POA: Diagnosis not present

## 2017-11-04 DIAGNOSIS — H02849 Edema of unspecified eye, unspecified eyelid: Secondary | ICD-10-CM | POA: Diagnosis not present

## 2017-11-04 DIAGNOSIS — I482 Chronic atrial fibrillation, unspecified: Secondary | ICD-10-CM

## 2017-11-04 DIAGNOSIS — I4819 Other persistent atrial fibrillation: Secondary | ICD-10-CM

## 2017-11-04 DIAGNOSIS — R0902 Hypoxemia: Secondary | ICD-10-CM | POA: Diagnosis not present

## 2017-11-04 LAB — CBC
HCT: 40.6 % (ref 39.0–52.0)
HEMATOCRIT: 38.8 % — AB (ref 39.0–52.0)
HEMOGLOBIN: 12.4 g/dL — AB (ref 13.0–17.0)
HEMOGLOBIN: 12.8 g/dL — AB (ref 13.0–17.0)
MCH: 29.7 pg (ref 26.0–34.0)
MCH: 29.2 pg (ref 26.0–34.0)
MCHC: 32 g/dL (ref 30.0–36.0)
MCHC: 31.5 g/dL (ref 30.0–36.0)
MCV: 92.8 fL (ref 78.0–100.0)
MCV: 92.7 fL (ref 78.0–100.0)
Platelets: 247 10*3/uL (ref 150–400)
Platelets: 269 10*3/uL (ref 150–400)
RBC: 4.18 MIL/uL — AB (ref 4.22–5.81)
RBC: 4.38 MIL/uL (ref 4.22–5.81)
RDW: 14.9 % (ref 11.5–15.5)
RDW: 14.7 % (ref 11.5–15.5)
WBC: 9.7 10*3/uL (ref 4.0–10.5)
WBC: 10.7 10*3/uL — ABNORMAL HIGH (ref 4.0–10.5)

## 2017-11-04 LAB — I-STAT TROPONIN, ED
TROPONIN I, POC: 0 ng/mL (ref 0.00–0.08)
Troponin i, poc: 0.02 ng/mL (ref 0.00–0.08)

## 2017-11-04 LAB — BASIC METABOLIC PANEL
ANION GAP: 12 (ref 5–15)
ANION GAP: 12 (ref 5–15)
BUN: 23 mg/dL (ref 8–23)
BUN: 20 mg/dL (ref 8–23)
CO2: 25 mmol/L (ref 22–32)
CO2: 24 mmol/L (ref 22–32)
Calcium: 8.8 mg/dL — ABNORMAL LOW (ref 8.9–10.3)
Calcium: 8.9 mg/dL (ref 8.9–10.3)
Chloride: 99 mmol/L (ref 98–111)
Chloride: 102 mmol/L (ref 98–111)
Creatinine, Ser: 1.06 mg/dL (ref 0.61–1.24)
Creatinine, Ser: 1.17 mg/dL (ref 0.61–1.24)
GFR calc Af Amer: >60 mL/min (ref >60)
GFR, EST NON AFRICAN AMERICAN: 58 mL/min — AB (ref >60)
GLUCOSE: 189 mg/dL — AB (ref 70–99)
GLUCOSE: 236 mg/dL — AB (ref 70–99)
POTASSIUM: 4.7 mmol/L (ref 3.5–5.1)
POTASSIUM: 3.8 mmol/L (ref 3.5–5.1)
Sodium: 136 mmol/L (ref 135–145)
Sodium: 138 mmol/L (ref 135–145)

## 2017-11-04 NOTE — Discharge Instructions (Addendum)
Please hold your flecainide until you follow-up with cardiology to determine whether you can be placed back on it or not.  I have placed a request for the atrial fibrillation clinic to contact you regarding a follow-up appointment this week.  If you do not hear from them by Tuesday afternoon.  Please call your cardiologist office for a follow-up.

## 2017-11-04 NOTE — ED Notes (Signed)
Patient requesting to be discharged. MD notified and reports will coming to reevaluate patient.

## 2017-11-04 NOTE — ED Notes (Signed)
EDP at bedside  

## 2017-11-04 NOTE — ED Notes (Signed)
Patient verbalizes understanding of discharge instructions. Opportunity for questioning and answers were provided. Armband removed by staff, pt discharged from ED.  

## 2017-11-04 NOTE — ED Triage Notes (Signed)
Per [facility] pt coming from home, c/o fluttering feeling in chest. Patient reports being seen last night and dc home but when he woke feeling became worse. Pt denies any shortness of breath or chest pain.

## 2017-11-04 NOTE — Discharge Instructions (Addendum)
Atrial fibrillation good control with your diltiazem.  Continue take that daily.  Follow-up with cardiology as arranged when he was seen earlier today.  Return for any new or worse symptoms.

## 2017-11-04 NOTE — ED Provider Notes (Signed)
[facility] EMERGENCY DEPARTMENT Provider Note  CSN: [id number] Arrival date & time: 11/04/17 0245  Chief Complaint(s) Atrial Fibrillation  HPI [name] is a 77 y.o. male w/ history of atrial fibrillation with a chads vas score of 7 on diltiazem and flecainide not currently anticoagulated due to GI bleed.  The history is provided by the patient.  Atrial Fibrillation  This is a recurrent problem. Episode onset: unsure. The problem occurs constantly. The problem has not changed since onset.Pertinent negatives include no chest pain, no abdominal pain, no headaches and no shortness of breath. Nothing aggravates the symptoms. Nothing relieves the symptoms. He has tried nothing for the symptoms.   Pt woke up around 1230a this am and felt his eye were puffy and mouth was dry. He decided to check his BP and HR and noted that his HR was "jumping around" so he called EMS. No CP or SOB, palpitations, nausea/vomiting, abd pain.   Called EMS who found pt was in atrial fibrilation. Pt states he has not taken his flecainide for 2 weeks due to forgetting to take medications. He states he had intermittent memory loss that has now resolved. He attributes this to a bronchitis flare.  Pt has been off of coumadin due to GI bleeds.   Past Medical History Past Medical History:  Diagnosis Date  . Anemia    takes Ferrous Sulfate daily  . Arthritis    "all over"  . Balance problem 01/2014  . CAP (community acquired pneumonia) 09/18/2014  . Cervical radiculopathy due to degenerative joint disease of spine   . COPD (chronic obstructive pulmonary disease) ([place])   . Coronary artery disease, non-occlusive    a. 03/2010 Nonocclusive disease by cath, performed for ST elevations on ECG;  b. 06/2013 Lexi MV: EF 60%, no ischemia.  . Diabetes mellitus type II    takes Metformin and Lantus daily  . Diastolic CHF, chronic ([place])    a. 12/2012 EF 55-60%, diast dysfxn, triv MR, mildly dil LA/RA.    [name] History of blood transfusion 1982   "when I had stomach OR"  . History of bronchitis    1998  . History of gastric ulcer   . HTN (hypertension)    takes Diltiazem daily  . Hyperlipidemia    takes Pravastatin daily  . Joint pain   . PAF (paroxysmal atrial fibrillation) (HCC)    Recurrent after atrial flutter (a. 07/2010 Status post caval tricuspid isthmus ablation by Dr. Midge Aver Metoprolol daily), currently controlled on flecainide plus diltiazem and Coumadin  . Weakness    numbness and tingling both hands   Patient Active Problem List   Diagnosis Date Noted  . Chest pain with moderate risk of acute coronary syndrome 10/04/2017  . HTN (hypertension) 05/21/2017  . Diabetes mellitus type II 05/21/2017  . Diastolic CHF, chronic ([place]) 05/21/2017  . COPD (chronic obstructive pulmonary disease) ([place]) 05/21/2017  . H/O atrial flutter 05/21/2017  . Coronary disease/nonobstructive 05/21/2017  . Acute blood loss anemia 10/02/2016  . Abdominal pain 10/02/2016  . Constipation 10/02/2016  . Left lower quadrant pain   . Coagulopathy ([place])   . Heme positive stool   . Symptomatic anemia 07/10/2016  . Syncope 05/15/2015  . Cervical disc disease 05/15/2015  . Abnormal urinalysis 05/15/2015  . Anemia 05/15/2015  . History of CVA (cerebrovascular accident) 04/03/2014  . Near syncope 02/15/2014  . Pre-syncope 02/15/2014  . Benign neoplasm of rectum and anal canal 12/02/2013  . Upper GI bleeding 11/30/2013  .  Tobacco use disorder 11/30/2013  . Anticoagulation goal of INR 2 to 3, for PAF - CHA2DS2Vasc = 7 08/15/2013  . Type 2 diabetes mellitus ([place]) 01/22/2013  . Tobacco abuse counseling 12/15/2012  . Hyperlipidemia   . Obesity (BMI 30-39.9) 12/14/2012  . Atrial flutter - status post CTI ablation 07/29/2010  . Essential hypertension 07/29/2010  . Chronic diastolic congestive heart failure ([place]) 07/29/2010  . Coronary artery disease, non-occlusive 07/29/2010   Home  Medication(s) Prior to Admission medications   Medication Sig Start Date End Date Taking? Authorizing Provider  albuterol (PROVENTIL HFA;VENTOLIN HFA) 108 (90 Base) MCG/ACT inhaler Inhale 2 puffs into the lungs every 4 (four) hours as needed for wheezing or shortness of breath. 10/30/17   Palumbo, April, MD  aspirin EC 81 MG tablet Take 81 mg by mouth daily.    [provider]  benzonatate (TESSALON) 100 MG capsule Take 1 capsule (100 mg total) by mouth every 8 (eight) hours. 10/30/17   Palumbo, April, MD  co-enzyme Q-10 30 MG capsule Take 30 mg by mouth daily.     [provider]  diltiazem (CARDIZEM CD) 120 MG 24 hr capsule Take 1 capsule (120 mg total) by mouth daily. 05/04/17   Leonie Man, MD  flecainide (TAMBOCOR) 150 MG tablet Take 1.5 tablets (225 mg total) by mouth 2 (two) times daily. 05/04/17   Leonie Man, MD  HYDROcodone-acetaminophen (NORCO/VICODIN) 5-325 MG tablet Take 1 tablet by mouth every 8 (eight) hours as needed for moderate pain.    [provider]  hydrocortisone cream 1 % Apply 1 application topically 2 (two) times daily.  01/02/17   [provider]  Insulin Glargine (LANTUS) 100 UNIT/ML Solostar Pen Inject 8 Units into the skin 2 (two) times daily. 11/30/16   Domenic Polite, MD  metFORMIN (GLUCOPHAGE) 1000 MG tablet Take 1,000 mg by mouth 2 (two) times daily with a meal.     [provider]  metoprolol succinate (TOPROL-XL) 25 MG 24 hr tablet TAKE 1/2 TABLET  (12.5 MG TOTAL) BY MOUTH DAILY. Patient taking differently: Take 12.5 mg by mouth every evening.  05/04/17   Leonie Man, MD  Multiple Vitamin (MULTIVITAMIN WITH MINERALS) TABS tablet Take 1 tablet by mouth 2 (two) times daily.     [provider]  nitroGLYCERIN (NITROSTAT) 0.4 MG SL tablet Place 1 tablet (0.4 mg total) under the tongue every 5 (five) minutes x 3 doses as needed for chest pain. 11/19/15   Erlene Quan, PA-C  pantoprazole (PROTONIX) 40 MG tablet  Take 1 tablet (40 mg total) by mouth 2 (two) times daily before a meal. 11/30/16   Domenic Polite, MD  polyethylene glycol [facility] / Floria Raveling) packet Take 17 g by mouth 2 (two) times daily. Patient taking differently: Take 17 g by mouth as needed for moderate constipation.  10/03/16   Raiford Noble Latif, DO  pravastatin (PRAVACHOL) 40 MG tablet Take 1 tablet (40 mg total) by mouth every evening. 05/04/17   Leonie Man, MD  predniSONE (DELTASONE) 20 MG tablet 3 tabs po day one, then 2 po daily x 4 days 10/30/17   Randal Buba, April, MD  tamsulosin (FLOMAX) 0.4 MG CAPS capsule Take 0.4 mg by mouth daily. 02/20/17   [provider]  Past Surgical History Past Surgical History:  Procedure Laterality Date  . ATRIAL ABLATION SURGERY  08/05/10   CTI ablation for atrial flutter by JA  . CARDIAC CATHETERIZATION  2012   nl LV function, no occlusive CAD, PAF  . CARDIOVERSION  12/07/2010    Successful direct current cardioversion with atrial fibrillation to normal sinus rhythm  . CARPAL TUNNEL RELEASE Bilateral 01/30/2014   Procedure: BILATERAL CARPAL TUNNEL RELEASE;  Surgeon: Marianna Payment, MD;  Location: [facility];  Service: Orthopedics;  Laterality: Bilateral;  . CATARACT EXTRACTION W/ INTRAOCULAR LENS  IMPLANT, BILATERAL Bilateral   . COLONOSCOPY [name] 12/02/2013   Procedure: COLONOSCOPY;  Surgeon: Irene Shipper, MD;  Location: [facility];  Service: Endoscopy;  Laterality: [name];  . ESOPHAGOGASTRODUODENOSCOPY [name] 09/22/2014   Procedure: ESOPHAGOGASTRODUODENOSCOPY (EGD);  Surgeon: Ronald Lobo, MD;  Location[facility]COPY;  Service: Endoscopy;  Laterality: [name];  . ESOPHAGOGASTRODUODENOSCOPY [name] 07/11/2016   Procedure: ESOPHAGOGASTRODUODENOSCOPY (EGD);  Surgeon: Doran Stabler, MD;  Location: [facility] ENDOSCOPY;  Service: Endoscopy;  Laterality: [name];  . INCISION AND DRAINAGE  ABSCESS / HEMATOMA OF BURSA / KNEE / THIGH Left 1998   knee  . KNEE BURSECTOMY Left 1998  . LAPAROSCOPIC CHOLECYSTECTOMY  03/2010  . LEFT HEART CATH AND CORONARY ANGIOGRAPHY [name] 10/05/2017   Procedure: LEFT HEART CATH AND CORONARY ANGIOGRAPHY;  Surgeon: Martinique, Peter M, MD;  Location: [facility] CV LAB;  Service: Cardiovascular;  Laterality: [name];  . NM MYOVIEW LTD  07/22/2013   Normal EF ~60%, no ischemia or infarction.  [name] PARTIAL GASTRECTOMY  1982   subtotal; "took out 30% for ulcers"  . TRANSTHORACIC ECHOCARDIOGRAM  02/16/2014   EF 60%, no RWMA. - otherwise normal  . YAG LASER APPLICATION Bilateral    Family History Family History  Problem Relation Age of Onset  . Cancer Mother   . Heart attack Father   . Heart attack Brother     Social History Social History   Tobacco Use  . Smoking status: Current Every Day Smoker    Packs/day: 0.50    Years: 57.00    Pack years: 28.50    Types: Cigarettes  . Smokeless tobacco: Never Used  . Tobacco comment: He's been smoking between 0.5 and 2 ppd since age 26.  Substance Use Topics  . Alcohol use: No    Alcohol/week: 0.0 standard drinks    Comment: "quit drinking in 1986"  . Drug use: No   Allergies Cyclobenzaprine  Review of Systems Review of Systems  Respiratory: Negative for shortness of breath.   Cardiovascular: Negative for chest pain.  Gastrointestinal: Negative for abdominal pain.  Neurological: Negative for headaches.   All other systems are reviewed and are negative for acute change except as noted in the HPI  Physical Exam Vital Signs  I have reviewed the triage vital signs BP 111/65  Pulse 86   Temp 98.4 F (36.9 C) (Oral)   Resp 11   Physical Exam  Constitutional: He is oriented to person, place, and time. He appears well-developed and well-nourished. No distress.  HENT:  Head: Normocephalic and atraumatic.  Nose: Nose normal.  Eyes: Pupils are equal, round, and reactive to light. Conjunctivae and EOM  are normal. Right eye exhibits no discharge. Left eye exhibits no discharge. No scleral icterus.  No edema noted  Neck: Normal range of motion. Neck supple.  Cardiovascular: Normal rate. An irregularly irregular rhythm present. Exam reveals no gallop and no friction rub.  No murmur heard. Pulmonary/Chest: Effort  normal and breath sounds normal. No stridor. No respiratory distress. He has no rales.  Abdominal: Soft. He exhibits no distension. There is no tenderness.  Musculoskeletal: He exhibits no edema or tenderness.  Neurological: He is alert and oriented to person, place, and time.  Skin: Skin is warm and dry. No rash noted. He is not diaphoretic. No erythema.  Psychiatric: He has a normal mood and affect.  Vitals reviewed.   ED Results and Treatments Labs (all labs ordered are listed, but only abnormal results are displayed) Labs Reviewed  BASIC METABOLIC PANEL - Abnormal; Notable for the following components:      Result Value   Glucose, Bld 189 (*)    Calcium 8.8 (*)    All other components within normal limits  CBC - Abnormal; Notable for the following components:   RBC 4.18 (*)    Hemoglobin 12.4 (*)    HCT 38.8 (*)    All other components within normal limits  I-STAT TROPONIN, ED                                                                                                                         EKG  EKG Interpretation  Date/Time:  Sunday November 04 2017 02:52:28 EDT Ventricular Rate:  96 PR Interval:    QRS Duration: 89 QT Interval:  365 QTC Calculation: 450 R Axis:   12 Text Interpretation:  Atrial fibrilation Borderline ST elevation, anterior leads Lateral leads are also involved Probable RV involvement, suggest recording right precordial leads Otherwise no significant change Confirmed by Addison Lank [id number]) on 11/04/2017 3:05:07 AM      Radiology Dg Chest 2 View  Result Date: 11/04/2017 CLINICAL DATA:  Atrial fibrillation EXAM: CHEST - 2 VIEW COMPARISON:   10/28/2017 FINDINGS: The heart size and mediastinal contours are within normal limits. Minimal thoracic aortic atherosclerosis, stable in appearance. Mild emphysematous hyperinflation of the lungs without acute pulmonary consolidation, CHF, effusion or pneumothorax. The visualized skeletal structures are unremarkable. IMPRESSION: No active pulmonary disease. Aortic atherosclerosis. Pulmonary hyperinflation Electronically Signed   By: Ashley Royalty M.D.   On: 11/04/2017 03:21   Pertinent labs & imaging results that were available during my care of the patient were reviewed by me and considered in my medical decision making (see chart for details).  Medications Ordered in ED Medications - No data to display  Procedures Procedures  (including critical care time)  Medical Decision Making / ED Course I have reviewed the nursing notes for this encounter and the patient's prior records (if available in EHR or on provided paperwork).    Patient in atrial fibrillation with rate control ranging from 70s to 90s.  Work-up without significant electrolyte derangements or anemia.  Chest x-ray negative.  Patient is taking his diltiazem at home.  Case discussed with cardiology who recommended continued rate control with diltiazem and close follow-up with A. fib clinic/cardiology to determine whether the patient can be placed back on flecainide.  The patient appears reasonably screened and/or stabilized for discharge and I doubt any other medical condition or other [facility] requiring further screening, evaluation, or treatment in the ED at this time prior to discharge.  The patient is safe for discharge with strict return precautions.   Final Clinical Impression(s) / ED Diagnoses Final diagnoses:  Persistent atrial fibrillation ([place])   Disposition: Discharge  Condition: Good  I have  discussed the results, Dx and Tx plan with the patient who expressed understanding and agree(s) with the plan. Discharge instructions discussed at great length. The patient was given strict return precautions who verbalized understanding of the instructions. No further questions at time of discharge.    ED Discharge Orders         Ordered    Amb referral to AFIB Clinic     11/04/17 0426           Follow Up: Leonie Man, MD [address] [place] [address] [phone]  Schedule an appointment as soon as possible for a visit  in 3-5 days      This chart was dictated using voice recognition software.  Despite best efforts to proofread,  errors can occur which can change the documentation meaning.   Fatima Blank, MD 11/04/17 [phone]

## 2017-11-04 NOTE — ED Triage Notes (Signed)
Pt from home where he lives alone.Pt called EMS tonight when "something" woke him up. He then checked his heart rate and blood pressure and states it was "jumping around"  No complaints at this time other than dry mouth and swollen eyes. Pt is on prednisone currently. Pt also states he missed a few doses of his flecanide and didn't know if it was safe to start back until he sees his cardiologist.

## 2017-11-04 NOTE — ED Provider Notes (Signed)
[facility] EMERGENCY DEPARTMENT Provider Note   CSN: [id number] Arrival date & time: 11/04/17  0849     History   Chief Complaint Chief Complaint  Patient presents with  . Atrial Fibrillation    HPI [name] is a 77 y.o. male.  Patient just discharged earlier this morning for an exacerbation of atrial fib.  During his evaluation at that time heart rate really remained in the 90s.  Patient is supposed to be on diltiazem a.m. as well as flecainide.  But he has not been compliant with the flecainide.  They discussed things with cardiology and patient just to continue with the diltiazem for now.  He has a chads vascular score of 7.  And patient was to follow-up with cardiology.  Patient when he got home felt as if his heart rate was going fast again.  He drove himself back in but he did take his diltiazem a.m. daily dose at 6 this morning.  Upon arrival here heart rate was on EKG in the 120s but that time is back in the room his heart rate was more in the upper 90s low 100s.  Patient without any chest pain or shortness of breath.  Labs were reviewed from last night as well as his chest x-ray.  Patient is not on blood thinners.  Because of GI bleed problems.     Past Medical History:  Diagnosis Date  . Anemia    takes Ferrous Sulfate daily  . Arthritis    "all over"  . Balance problem 01/2014  . CAP (community acquired pneumonia) 09/18/2014  . Cervical radiculopathy due to degenerative joint disease of spine   . COPD (chronic obstructive pulmonary disease) ([place])   . Coronary artery disease, non-occlusive    a. 03/2010 Nonocclusive disease by cath, performed for ST elevations on ECG;  b. 06/2013 Lexi MV: EF 60%, no ischemia.  . Diabetes mellitus type II    takes Metformin and Lantus daily  . Diastolic CHF, chronic ([place])    a. 12/2012 EF 55-60%, diast dysfxn, triv MR, mildly dil LA/RA.  [name] History of blood transfusion 1982   "when I had stomach OR"  . History  of bronchitis    1998  . History of gastric ulcer   . HTN (hypertension)    takes Diltiazem daily  . Hyperlipidemia    takes Pravastatin daily  . Joint pain   . PAF (paroxysmal atrial fibrillation) ([facility])    Recurrent after atrial flutter (a. 07/2010 Status post caval tricuspid isthmus ablation by Dr. Midge Aver Metoprolol daily), currently controlled on flecainide plus diltiazem and Coumadin  . Weakness    numbness and tingling both hands    Patient Active Problem List   Diagnosis Date Noted  . Chest pain with moderate risk of acute coronary syndrome 10/04/2017  . HTN (hypertension) 05/21/2017  . Diabetes mellitus type II 05/21/2017  . Diastolic CHF, chronic ([place]) 05/21/2017  . COPD (chronic obstructive pulmonary disease) ([place]) 05/21/2017  . H/O atrial flutter 05/21/2017  . Coronary disease/nonobstructive 05/21/2017  . Acute blood loss anemia 10/02/2016  . Abdominal pain 10/02/2016  . Constipation 10/02/2016  . Left lower quadrant pain   . Coagulopathy ([place])   . Heme positive stool   . Symptomatic anemia 07/10/2016  . Syncope 05/15/2015  . Cervical disc disease 05/15/2015  . Abnormal urinalysis 05/15/2015  . Anemia 05/15/2015  . History of CVA (cerebrovascular accident) 04/03/2014  . Near syncope 02/15/2014  . Pre-syncope 02/15/2014  .  Benign neoplasm of rectum and anal canal 12/02/2013  . Upper GI bleeding 11/30/2013  . Tobacco use disorder 11/30/2013  . Anticoagulation goal of INR 2 to 3, for PAF - CHA2DS2Vasc = 7 08/15/2013  . Type 2 diabetes mellitus ([place]) 01/22/2013  . Tobacco abuse counseling 12/15/2012  . Hyperlipidemia   . Obesity (BMI 30-39.9) 12/14/2012  . Atrial flutter - status post CTI ablation 07/29/2010  . Essential hypertension 07/29/2010  . Chronic diastolic congestive heart failure ([place]) 07/29/2010  . Coronary artery disease, non-occlusive 07/29/2010    Past Surgical History:  Procedure Laterality Date  . ATRIAL ABLATION SURGERY  08/05/10   CTI  ablation for atrial flutter by JA  . CARDIAC CATHETERIZATION  2012   nl LV function, no occlusive CAD, PAF  . CARDIOVERSION  12/07/2010    Successful direct current cardioversion with atrial fibrillation to normal sinus rhythm  . CARPAL TUNNEL RELEASE Bilateral 01/30/2014   Procedure: BILATERAL CARPAL TUNNEL RELEASE;  Surgeon: Marianna Payment, MD;  Location: [facility];  Service: Orthopedics;  Laterality: Bilateral;  . CATARACT EXTRACTION W/ INTRAOCULAR LENS  IMPLANT, BILATERAL Bilateral   . COLONOSCOPY [name] 12/02/2013   Procedure: COLONOSCOPY;  Surgeon: Irene Shipper, MD;  Location: [facility];  Service: Endoscopy;  Laterality: [name];  . ESOPHAGOGASTRODUODENOSCOPY [name] 09/22/2014   Procedure: ESOPHAGOGASTRODUODENOSCOPY (EGD);  Surgeon: Ronald Lobo, MD;  Location[facility] ENDOSCOPY;  Service: Endoscopy;  Laterality: [name];  . ESOPHAGOGASTRODUODENOSCOPY [name] 07/11/2016   Procedure: ESOPHAGOGASTRODUODENOSCOPY (EGD);  Surgeon: Doran Stabler, MD;  Location: [facility] ENDOSCOPY;  Service: Endoscopy;  Laterality: [name];  . INCISION AND DRAINAGE ABSCESS / HEMATOMA OF BURSA / KNEE / THIGH Left 1998   knee  . KNEE BURSECTOMY Left 1998  . LAPAROSCOPIC CHOLECYSTECTOMY  03/2010  . LEFT HEART CATH AND CORONARY ANGIOGRAPHY [name] 10/05/2017   Procedure: LEFT HEART CATH AND CORONARY ANGIOGRAPHY;  Surgeon: Martinique, Peter M, MD;  Location: [facility] CV LAB;  Service: Cardiovascular;  Laterality: [name];  . NM MYOVIEW LTD  07/22/2013   Normal EF ~60%, no ischemia or infarction.  [name] PARTIAL GASTRECTOMY  1982   subtotal; "took out 30% for ulcers"  . TRANSTHORACIC ECHOCARDIOGRAM  02/16/2014   EF 60%, no RWMA. - otherwise normal  . YAG LASER APPLICATION Bilateral         Home Medications    Prior to Admission medications   Medication Sig Start Date End Date Taking? Authorizing Provider  acetaminophen (TYLENOL) 325 MG tablet Take 325 mg by mouth every 6 (six) hours as needed for mild pain.   Yes [provider]  albuterol  (PROVENTIL HFA;VENTOLIN HFA) 108 (90 Base) MCG/ACT inhaler Inhale 2 puffs into the lungs every 4 (four) hours as needed for wheezing or shortness of breath. 10/30/17  Yes Palumbo, April, MD  aspirin EC 81 MG tablet Take 81 mg by mouth daily.   Yes [provider]  benzonatate (TESSALON) 100 MG capsule Take 1 capsule (100 mg total) by mouth every 8 (eight) hours. Patient taking differently: Take 100 mg by mouth daily as needed.  10/30/17  Yes Palumbo, April, MD  co-enzyme Q-10 30 MG capsule Take 30 mg by mouth daily.    Yes [provider]  diltiazem (CARDIZEM CD) 120 MG 24 hr capsule Take 1 capsule (120 mg total) by mouth daily. 05/04/17  Yes Leonie Man, MD  HYDROcodone-acetaminophen (NORCO/VICODIN) 5-325 MG tablet Take 1 tablet by mouth every 8 (eight) hours as needed for moderate pain.   Yes  [provider]  hydrocortisone cream 1 % Apply 1 application topically 2 (two) times daily.  01/02/17  Yes [provider]  Insulin Glargine (LANTUS) 100 UNIT/ML Solostar Pen Inject 8 Units into the skin 2 (two) times daily. 11/30/16  Yes Domenic Polite, MD  metFORMIN (GLUCOPHAGE) 1000 MG tablet Take 1,000 mg by mouth 2 (two) times daily with a meal.    Yes [provider]  metoprolol succinate (TOPROL-XL) 25 MG 24 hr tablet TAKE 1/2 TABLET  (12.5 MG TOTAL) BY MOUTH DAILY. Patient taking differently: Take 12.5 mg by mouth every evening.  05/04/17  Yes Leonie Man, MD  Multiple Vitamin (MULTIVITAMIN WITH MINERALS) TABS tablet Take 1 tablet by mouth 2 (two) times daily.    Yes [provider]  nitroGLYCERIN (NITROSTAT) 0.4 MG SL tablet Place 1 tablet (0.4 mg total) under the tongue every 5 (five) minutes x 3 doses as needed for chest pain. 11/19/15  Yes Kilroy, Luke K, PA-C  pantoprazole (PROTONIX) 40 MG tablet Take 1 tablet (40 mg total) by mouth 2 (two) times daily before a meal. 11/30/16  Yes Domenic Polite, MD  polyethylene glycol (MIRALAX / GLYCOLAX)  packet Take 17 g by mouth 2 (two) times daily. Patient taking differently: Take 17 g by mouth as needed for moderate constipation.  10/03/16  Yes Sheikh, Omair Latif, DO  pravastatin (PRAVACHOL) 40 MG tablet Take 1 tablet (40 mg total) by mouth every evening. 05/04/17  Yes Leonie Man, MD  tamsulosin (FLOMAX) 0.4 MG CAPS capsule Take 0.4 mg by mouth daily. 02/20/17  Yes [provider]  flecainide (TAMBOCOR) 150 MG tablet Take 1.5 tablets (225 mg total) by mouth 2 (two) times daily. 05/04/17   Leonie Man, MD    Family History Family History  Problem Relation Age of Onset  . Cancer Mother   . Heart attack Father   . Heart attack Brother     Social History Social History   Tobacco Use  . Smoking status: Current Every Day Smoker    Packs/day: 0.50    Years: 57.00    Pack years: 28.50    Types: Cigarettes  . Smokeless tobacco: Never Used  . Tobacco comment: He's been smoking between 0.5 and 2 ppd since age 45.  Substance Use Topics  . Alcohol use: No    Alcohol/week: 0.0 standard drinks    Comment: "quit drinking in 1986"  . Drug use: No     Allergies   Cyclobenzaprine   Review of Systems Review of Systems  Constitutional: Negative for fever.  HENT: Negative for congestion.   Eyes: Negative for redness.  Respiratory: Negative for shortness of breath.   Cardiovascular: Positive for palpitations. Negative for chest pain.  Gastrointestinal: Negative for abdominal pain.  Genitourinary: Negative for dysuria.  Musculoskeletal: Negative for back pain.  Skin: Negative for rash.  Neurological: Negative for syncope.  Hematological: Does not bruise/bleed easily.  Psychiatric/Behavioral: Negative for confusion.     Physical Exam Updated Vital Signs BP 102/66   Pulse 84   Temp 98.6 F (37 C) (Oral)   Resp (!) 22   Ht 1.676 m (5\' 6" )   Wt 79.4 kg   SpO2 97%   BMI 28.25 kg/m   Physical Exam  Constitutional: He is oriented to person, place, and time. He  appears well-developed and well-nourished. No distress.  HENT:  Head: Normocephalic and atraumatic.  Mouth/Throat: Oropharynx is clear and moist.  Eyes: Pupils are equal, round, and reactive  to light. Conjunctivae and EOM are normal.  Neck: Neck supple.  Cardiovascular:  Slightly tachycardic irregular heart rate.  Pulmonary/Chest: Effort normal and breath sounds normal. No respiratory distress.  Abdominal: Soft. Bowel sounds are normal. There is no tenderness.  Musculoskeletal: Normal range of motion.  Neurological: He is alert and oriented to person, place, and time. No cranial nerve deficit or sensory deficit. He exhibits normal muscle tone. Coordination normal.  Skin: Skin is warm.  Nursing note and vitals reviewed.    ED Treatments / Results  Labs (all labs ordered are listed, but only abnormal results are displayed) Labs Reviewed  BASIC METABOLIC PANEL - Abnormal; Notable for the following components:      Result Value   Glucose, Bld 236 (*)    GFR calc non Af Amer 58 (*)    All other components within normal limits  CBC - Abnormal; Notable for the following components:   WBC 10.7 (*)    Hemoglobin 12.8 (*)    All other components within normal limits  I-STAT TROPONIN, ED    EKG EKG Interpretation  Date/Time:  Sunday November 04 2017 08:52:42 EDT Ventricular Rate:  128 PR Interval:    QRS Duration: 94 QT Interval:  333 QTC Calculation: 486 R Axis:   -28 Text Interpretation:  Atrial fibrillation Inferior infarct, old Confirmed by Fredia Sorrow [phone]) on 11/04/2017 8:58:19 AM Also confirmed by Fredia Sorrow [phone]), editor Lynder Parents [phone])  on 11/04/2017 10:12:19 AM   Radiology Dg Chest 2 View  Result Date: 11/04/2017 CLINICAL DATA:  Atrial fibrillation EXAM: CHEST - 2 VIEW COMPARISON:  10/28/2017 FINDINGS: The heart size and mediastinal contours are within normal limits. Minimal thoracic aortic atherosclerosis, stable in appearance. Mild emphysematous  hyperinflation of the lungs without acute pulmonary consolidation, CHF, effusion or pneumothorax. The visualized skeletal structures are unremarkable. IMPRESSION: No active pulmonary disease. Aortic atherosclerosis. Pulmonary hyperinflation Electronically Signed   By: Ashley Royalty M.D.   On: 11/04/2017 03:21    Procedures Procedures (including critical care time)  Medications Ordered in ED Medications - No data to display   Initial Impression / Assessment and Plan / ED Course  I have reviewed the triage vital signs and the nursing notes.  Pertinent labs & imaging results that were available during my care of the patient were reviewed by me and considered in my medical decision making (see chart for details).     Patient heart rhythm here consistent with atrial fibrillation but well controlled diltiazem was to kicked in.  Heart rate in the 80s.  Patient without any chest pain nontoxic no acute distress.  Labs were repeated without any significant change.  Patient will follow-up with cardiology as was arranged from his visit earlier today.  And patient will continue his diltiazem daily.  Final Clinical Impressions(s) / ED Diagnoses   Final diagnoses:  None    ED Discharge Orders    None       Fredia Sorrow, MD 11/04/17 1307

## 2017-11-04 NOTE — ED Notes (Signed)
Pt's number [phone]

## 2017-11-05 ENCOUNTER — Telehealth (HOSPITAL_COMMUNITY): Payer: Self-pay | Admitting: *Deleted

## 2017-11-05 NOTE — Telephone Encounter (Signed)
LMOM for pt to callback to sched follow up appt

## 2017-11-07 ENCOUNTER — Other Ambulatory Visit: Payer: Self-pay

## 2017-11-07 ENCOUNTER — Emergency Department (HOSPITAL_COMMUNITY)
Admission: EM | Admit: 2017-11-07 | Discharge: 2017-11-08 | Disposition: A | Discharge status: Home / Self Care | Payer: Medicare Other | Source: Home / Self Care | Attending: Emergency Medicine | Admitting: Emergency Medicine

## 2017-11-07 ENCOUNTER — Encounter (HOSPITAL_COMMUNITY): Payer: Self-pay | Admitting: Emergency Medicine

## 2017-11-07 ENCOUNTER — Emergency Department (HOSPITAL_COMMUNITY): Payer: Medicare Other

## 2017-11-07 DIAGNOSIS — Z794 Long term (current) use of insulin: Secondary | ICD-10-CM | POA: Insufficient documentation

## 2017-11-07 DIAGNOSIS — R002 Palpitations: Secondary | ICD-10-CM

## 2017-11-07 DIAGNOSIS — I5032 Chronic diastolic (congestive) heart failure: Secondary | ICD-10-CM | POA: Insufficient documentation

## 2017-11-07 DIAGNOSIS — E162 Hypoglycemia, unspecified: Secondary | ICD-10-CM

## 2017-11-07 DIAGNOSIS — Z7982 Long term (current) use of aspirin: Secondary | ICD-10-CM

## 2017-11-07 DIAGNOSIS — J449 Chronic obstructive pulmonary disease, unspecified: Secondary | ICD-10-CM | POA: Insufficient documentation

## 2017-11-07 DIAGNOSIS — I110 Hypertensive heart disease with heart failure: Secondary | ICD-10-CM | POA: Insufficient documentation

## 2017-11-07 DIAGNOSIS — E11649 Type 2 diabetes mellitus with hypoglycemia without coma: Secondary | ICD-10-CM

## 2017-11-07 DIAGNOSIS — I4891 Unspecified atrial fibrillation: Secondary | ICD-10-CM | POA: Diagnosis not present

## 2017-11-07 DIAGNOSIS — I10 Essential (primary) hypertension: Secondary | ICD-10-CM | POA: Diagnosis not present

## 2017-11-07 DIAGNOSIS — I480 Paroxysmal atrial fibrillation: Secondary | ICD-10-CM | POA: Insufficient documentation

## 2017-11-07 DIAGNOSIS — F17210 Nicotine dependence, cigarettes, uncomplicated: Secondary | ICD-10-CM | POA: Insufficient documentation

## 2017-11-07 DIAGNOSIS — I2510 Atherosclerotic heart disease of native coronary artery without angina pectoris: Secondary | ICD-10-CM | POA: Insufficient documentation

## 2017-11-07 DIAGNOSIS — Z79899 Other long term (current) drug therapy: Secondary | ICD-10-CM | POA: Insufficient documentation

## 2017-11-07 DIAGNOSIS — E785 Hyperlipidemia, unspecified: Secondary | ICD-10-CM

## 2017-11-07 DIAGNOSIS — R0902 Hypoxemia: Secondary | ICD-10-CM | POA: Diagnosis not present

## 2017-11-07 LAB — CBC WITH DIFFERENTIAL/PLATELET
ABS IMMATURE GRANULOCYTES: 0 10*3/uL (ref 0.0–0.1)
BASOS ABS: 0 10*3/uL (ref 0.0–0.1)
Basophils Relative: 0 %
Eosinophils Absolute: 0.1 10*3/uL (ref 0.0–0.7)
Eosinophils Relative: 1 %
HEMATOCRIT: 36.5 % — AB (ref 39.0–52.0)
Hemoglobin: 11.2 g/dL — ABNORMAL LOW (ref 13.0–17.0)
IMMATURE GRANULOCYTES: 0 %
LYMPHS ABS: 1.1 10*3/uL (ref 0.7–4.0)
LYMPHS PCT: 11 %
MCH: 28.8 pg (ref 26.0–34.0)
MCHC: 30.7 g/dL (ref 30.0–36.0)
MCV: 93.8 fL (ref 78.0–100.0)
Monocytes Absolute: 0.9 10*3/uL (ref 0.1–1.0)
Monocytes Relative: 10 %
NEUTROS ABS: 7.7 10*3/uL (ref 1.7–7.7)
NEUTROS PCT: 78 %
Platelets: 254 10*3/uL (ref 150–400)
RBC: 3.89 MIL/uL — AB (ref 4.22–5.81)
RDW: 14.9 % (ref 11.5–15.5)
WBC: 9.8 10*3/uL (ref 4.0–10.5)

## 2017-11-07 LAB — BASIC METABOLIC PANEL
ANION GAP: 10 (ref 5–15)
BUN: 11 mg/dL (ref 8–23)
CHLORIDE: 105 mmol/L (ref 98–111)
CO2: 24 mmol/L (ref 22–32)
CREATININE: 1.05 mg/dL (ref 0.61–1.24)
Calcium: 8.4 mg/dL — ABNORMAL LOW (ref 8.9–10.3)
GFR calc non Af Amer: >60 mL/min (ref >60)
Glucose, Bld: 66 mg/dL — ABNORMAL LOW (ref 70–99)
POTASSIUM: 3.9 mmol/L (ref 3.5–5.1)
SODIUM: 139 mmol/L (ref 135–145)

## 2017-11-07 LAB — CBG MONITORING, ED
GLUCOSE-CAPILLARY: 42 mg/dL — AB (ref 70–99)
GLUCOSE-CAPILLARY: 44 mg/dL — AB (ref 70–99)
GLUCOSE-CAPILLARY: 53 mg/dL — AB (ref 70–99)
Glucose-Capillary: 207 mg/dL — ABNORMAL HIGH (ref 70–99)
Glucose-Capillary: 50 mg/dL — ABNORMAL LOW (ref 70–99)

## 2017-11-07 LAB — I-STAT TROPONIN, ED: Troponin i, poc: 0.01 ng/mL (ref 0.00–0.08)

## 2017-11-07 MED ORDER — DEXTROSE 50 % IV SOLN
1.0000 | Freq: Once | INTRAVENOUS | Status: AC
  Administered 2017-11-07: 50 mL via INTRAVENOUS
  Filled 2017-11-07: qty 50

## 2017-11-07 MED ORDER — IPRATROPIUM-ALBUTEROL 0.5-2.5 (3) MG/3ML IN SOLN
3.0000 mL | Freq: Once | RESPIRATORY_TRACT | Status: AC
  Administered 2017-11-07: 3 mL via RESPIRATORY_TRACT
  Filled 2017-11-07: qty 3

## 2017-11-07 NOTE — ED Provider Notes (Signed)
[place] [facility] EMERGENCY DEPARTMENT Provider Note   CSN: [id number] Arrival date & time: 11/07/17  1655     History   Chief Complaint Chief Complaint  Patient presents with  . Palpitations    HPI [name] is a 77 y.o. male.  HPI   Patient is a 77 year old male with a history of anemia, COPD, CAD Y0DX, diastolic CHF, hypertension, hyperlipidemia, paroxysmal atrial fibrillation, who presents emergency department today for evaluation of palpitations that began this morning.  States that the palpitations occur when he sits up or walks around.  They are associated with dyspnea on exertion.  Denies any chest pain or chest tightness.  Denies any fevers or chills.  Has had a mild cough and states that he has a history of bronchitis.  No lower extremity swelling.  No diaphoresis, nausea or vomiting.  States that he has been taking his diltiazem as directed.  States he has a appointment with the A. fib clinic tomorrow at 2:30pm.  He he is not currently anticoagulated due to history of GI bleed.  Reviewed records.  Patient has been seen in the emergency department multiple times for similar symptoms, most recently on 11/04/2017.  His labs are within normal limits and his heart was well controlled after he took his diltiazem.  He was discharged with instructions to follow-up with the A. fib clinic.  He also was seen on 10/28/2017 and had a negative d-dimer.  Past Medical History:  Diagnosis Date  . Anemia    takes Ferrous Sulfate daily  . Arthritis    "all over"  . Balance problem 01/2014  . CAP (community acquired pneumonia) 09/18/2014  . Cervical radiculopathy due to degenerative joint disease of spine   . COPD (chronic obstructive pulmonary disease) ([place])   . Coronary artery disease, non-occlusive    a. 03/2010 Nonocclusive disease by cath, performed for ST elevations on ECG;  b. 06/2013 Lexi MV: EF 60%, no ischemia.  . Diabetes mellitus type II    takes Metformin and  Lantus daily  . Diastolic CHF, chronic ([place])    a. 12/2012 EF 55-60%, diast dysfxn, triv MR, mildly dil LA/RA.  [name] History of blood transfusion 1982   "when I had stomach OR"  . History of bronchitis    1998  . History of gastric ulcer   . HTN (hypertension)    takes Diltiazem daily  . Hyperlipidemia    takes Pravastatin daily  . Joint pain   . PAF (paroxysmal atrial fibrillation) ([facility])    Recurrent after atrial flutter (a. 07/2010 Status post caval tricuspid isthmus ablation by Dr. Midge Aver Metoprolol daily), currently controlled on flecainide plus diltiazem and Coumadin  . Weakness    numbness and tingling both hands    Patient Active Problem List   Diagnosis Date Noted  . Chest pain with moderate risk of acute coronary syndrome 10/04/2017  . HTN (hypertension) 05/21/2017  . Diabetes mellitus type II 05/21/2017  . Diastolic CHF, chronic ([place]) 05/21/2017  . COPD (chronic obstructive pulmonary disease) ([place]) 05/21/2017  . H/O atrial flutter 05/21/2017  . Coronary disease/nonobstructive 05/21/2017  . Acute blood loss anemia 10/02/2016  . Abdominal pain 10/02/2016  . Constipation 10/02/2016  . Left lower quadrant pain   . Coagulopathy ([place])   . Heme positive stool   . Symptomatic anemia 07/10/2016  . Syncope 05/15/2015  . Cervical disc disease 05/15/2015  . Abnormal urinalysis 05/15/2015  . Anemia 05/15/2015  . History of CVA (cerebrovascular accident) 04/03/2014  .  Near syncope 02/15/2014  . Pre-syncope 02/15/2014  . Benign neoplasm of rectum and anal canal 12/02/2013  . Upper GI bleeding 11/30/2013  . Tobacco use disorder 11/30/2013  . Anticoagulation goal of INR 2 to 3, for PAF - CHA2DS2Vasc = 7 08/15/2013  . Type 2 diabetes mellitus ([place]) 01/22/2013  . Tobacco abuse counseling 12/15/2012  . Hyperlipidemia   . Obesity (BMI 30-39.9) 12/14/2012  . Atrial flutter - status post CTI ablation 07/29/2010  . Essential hypertension 07/29/2010  . Chronic diastolic  congestive heart failure ([place]) 07/29/2010  . Coronary artery disease, non-occlusive 07/29/2010    Past Surgical History:  Procedure Laterality Date  . ATRIAL ABLATION SURGERY  08/05/10   CTI ablation for atrial flutter by JA  . CARDIAC CATHETERIZATION  2012   nl LV function, no occlusive CAD, PAF  . CARDIOVERSION  12/07/2010    Successful direct current cardioversion with atrial fibrillation to normal sinus rhythm  . CARPAL TUNNEL RELEASE Bilateral 01/30/2014   Procedure: BILATERAL CARPAL TUNNEL RELEASE;  Surgeon: Marianna Payment, MD;  Location: [facility];  Service: Orthopedics;  Laterality: Bilateral;  . CATARACT EXTRACTION W/ INTRAOCULAR LENS  IMPLANT, BILATERAL Bilateral   . COLONOSCOPY [name] 12/02/2013   Procedure: COLONOSCOPY;  Surgeon: Irene Shipper, MD;  Location: [facility];  Service: Endoscopy;  Laterality: [name];  . ESOPHAGOGASTRODUODENOSCOPY [name] 09/22/2014   Procedure: ESOPHAGOGASTRODUODENOSCOPY (EGD);  Surgeon: Ronald Lobo, MD;  Location: [facility] ENDOSCOPY;  Service: Endoscopy;  Laterality: [name];  . ESOPHAGOGASTRODUODENOSCOPY [name] 07/11/2016   Procedure: ESOPHAGOGASTRODUODENOSCOPY (EGD);  Surgeon: Doran Stabler, MD;  Location: [facility] ENDOSCOPY;  Service: Endoscopy;  Laterality: [name];  . INCISION AND DRAINAGE ABSCESS / HEMATOMA OF BURSA / KNEE / THIGH Left 1998   knee  . KNEE BURSECTOMY Left 1998  . LAPAROSCOPIC CHOLECYSTECTOMY  03/2010  . LEFT HEART CATH AND CORONARY ANGIOGRAPHY [name] 10/05/2017   Procedure: LEFT HEART CATH AND CORONARY ANGIOGRAPHY;  Surgeon: Martinique, Peter M, MD;  Location: [facility] CV LAB;  Service: Cardiovascular;  Laterality: [name];  . NM MYOVIEW LTD  07/22/2013   Normal EF ~60%, no ischemia or infarction.  [name] PARTIAL GASTRECTOMY  1982   subtotal; "took out 30% for ulcers"  . TRANSTHORACIC ECHOCARDIOGRAM  02/16/2014   EF 60%, no RWMA. - otherwise normal  . YAG LASER APPLICATION Bilateral         Home Medications    Prior to Admission medications   Medication Sig  Start Date End Date Taking? Authorizing Provider  acetaminophen (TYLENOL) 325 MG tablet Take 325 mg by mouth every 6 (six) hours as needed for mild pain.    [provider]  albuterol (PROVENTIL HFA;VENTOLIN HFA) 108 (90 Base) MCG/ACT inhaler Inhale 2 puffs into the lungs every 4 (four) hours as needed for wheezing or shortness of breath. 10/30/17   Palumbo, April, MD  aspirin EC 81 MG tablet Take 81 mg by mouth daily.    [provider]  benzonatate (TESSALON) 100 MG capsule Take 1 capsule (100 mg total) by mouth every 8 (eight) hours. Patient taking differently: Take 100 mg by mouth daily as needed.  10/30/17   Palumbo, April, MD  co-enzyme Q-10 30 MG capsule Take 30 mg by mouth daily.     [provider]  diltiazem (CARDIZEM CD) 120 MG 24 hr capsule Take 1 capsule (120 mg total) by mouth daily. 05/04/17   Leonie Man, MD  flecainide (TAMBOCOR) 150 MG tablet Take 1.5 tablets (225 mg total) by mouth  2 (two) times daily. 05/04/17   Leonie Man, MD  HYDROcodone-acetaminophen (NORCO/VICODIN) 5-325 MG tablet Take 1 tablet by mouth every 8 (eight) hours as needed for moderate pain.    [provider]  hydrocortisone cream 1 % Apply 1 application topically 2 (two) times daily.  01/02/17   [provider]  Insulin Glargine (LANTUS) 100 UNIT/ML Solostar Pen Inject 8 Units into the skin 2 (two) times daily. 11/30/16   Domenic Polite, MD  metFORMIN (GLUCOPHAGE) 1000 MG tablet Take 1,000 mg by mouth 2 (two) times daily with a meal.     [provider]  metoprolol succinate (TOPROL-XL) 25 MG 24 hr tablet TAKE 1/2 TABLET  (12.5 MG TOTAL) BY MOUTH DAILY. Patient taking differently: Take 12.5 mg by mouth every evening.  05/04/17   Leonie Man, MD  Multiple Vitamin (MULTIVITAMIN WITH MINERALS) TABS tablet Take 1 tablet by mouth 2 (two) times daily.     [provider]  nitroGLYCERIN (NITROSTAT) 0.4 MG SL tablet Place 1 tablet (0.4 mg total) under the  tongue every 5 (five) minutes x 3 doses as needed for chest pain. 11/19/15   Erlene Quan, PA-C  pantoprazole (PROTONIX) 40 MG tablet Take 1 tablet (40 mg total) by mouth 2 (two) times daily before a meal. 11/30/16   Domenic Polite, MD  polyethylene glycol [facility] / Floria Raveling) packet Take 17 g by mouth 2 (two) times daily. Patient taking differently: Take 17 g by mouth as needed for moderate constipation.  10/03/16   Raiford Noble Latif, DO  pravastatin (PRAVACHOL) 40 MG tablet Take 1 tablet (40 mg total) by mouth every evening. 05/04/17   Leonie Man, MD  tamsulosin (FLOMAX) 0.4 MG CAPS capsule Take 0.4 mg by mouth daily. 02/20/17   [provider]    Family History Family History  Problem Relation Age of Onset  . Cancer Mother   . Heart attack Father   . Heart attack Brother     Social History Social History   Tobacco Use  . Smoking status: Current Every Day Smoker    Packs/day: 0.50    Years: 57.00    Pack years: 28.50    Types: Cigarettes  . Smokeless tobacco: Never Used  . Tobacco comment: He's been smoking between 0.5 and 2 ppd since age 56.  Substance Use Topics  . Alcohol use: No    Alcohol/week: 0.0 standard drinks    Comment: "quit drinking in 1986"  . Drug use: No     Allergies   Cyclobenzaprine   Review of Systems Review of Systems  Constitutional: Negative for chills and fever.  HENT: Negative for congestion.   Eyes: Negative for visual disturbance.  Respiratory: Positive for cough and shortness of breath. Negative for wheezing.   Cardiovascular: Positive for palpitations. Negative for chest pain and leg swelling.  Gastrointestinal: Negative for abdominal pain, nausea and vomiting.  Genitourinary: Negative for dysuria.  Musculoskeletal: Negative for back pain and neck pain.  Skin: Negative for color change.  Neurological: Negative for headaches.   Physical Exam Updated Vital Signs BP (!) 123/58   Pulse 82   Temp 97.8 F (36.6 C) (Oral)    Resp 16   Ht 5\' 6"  (1.676 m)   Wt 79 kg   SpO2 99%   BMI 28.11 kg/m   Physical Exam  Constitutional: He appears well-developed and well-nourished. No distress.  HENT:  Head: Normocephalic and atraumatic.  Eyes: Conjunctivae are normal.  Neck: Neck supple.  Cardiovascular: Normal rate, regular rhythm and intact distal pulses.  No murmur heard. NSR on monitor, HR in 90s.  Pulmonary/Chest: Effort normal. No respiratory distress.  Course breath sounds with expiration, scant wheezes.   Abdominal: Soft. There is no tenderness.  Musculoskeletal: He exhibits no edema.  Neurological: He is alert.  Skin: Skin is warm and dry.  Psychiatric: He has a normal mood and affect.  Nursing note and vitals reviewed.  ED Treatments / Results  Labs (all labs ordered are listed, but only abnormal results are displayed) Labs Reviewed  CBC WITH DIFFERENTIAL/PLATELET - Abnormal; Notable for the following components:      Result Value   RBC 3.89 (*)    Hemoglobin 11.2 (*)    HCT 36.5 (*)    All other components within normal limits  BASIC METABOLIC PANEL - Abnormal; Notable for the following components:   Glucose, Bld 66 (*)    Calcium 8.4 (*)    All other components within normal limits  CBG MONITORING, ED - Abnormal; Notable for the following components:   Glucose-Capillary 42 (*)    All other components within normal limits  CBG MONITORING, ED - Abnormal; Notable for the following components:   Glucose-Capillary 44 (*)    All other components within normal limits  CBG MONITORING, ED - Abnormal; Notable for the following components:   Glucose-Capillary 53 (*)    All other components within normal limits  CBG MONITORING, ED - Abnormal; Notable for the following components:   Glucose-Capillary 50 (*)    All other components within normal limits  I-STAT TROPONIN, ED    EKG EKG Interpretation  Date/Time:  Wednesday November 07 2017 18:45:38 EDT Ventricular Rate:  93 PR Interval:    QRS  Duration: 80 QT Interval:  341 QTC Calculation: 425 R Axis:   -3 Text Interpretation:  Sinus rhythm Prolonged PR interval ST  changes seen on 2011 tracing.  No STEMI.  Confirmed by Nanda Quinton [phone]) on 11/07/2017 6:48:06 PM   Radiology Dg Chest 2 View  Result Date: 11/07/2017 CLINICAL DATA:  Pt c/o palpitations x 1 day. Hx of COPD, CAD, DM, CHF, AND HTN. Pt is a current smoker. EXAM: CHEST - 2 VIEW COMPARISON:  11/04/2017 FINDINGS: Mild hyperinflation. Lower thoracic spondylosis. Numerous leads and wires project over the chest. Midline trachea. Normal heart size. Atherosclerosis in the transverse aorta. No pleural effusion or pneumothorax. Mild interstitial thickening is lower lobe predominant. No lobar consolidation. No congestive failure. Probable scarring in the left lung base laterally. Surgical clips project over the gastroesophageal junction. IMPRESSION: No acute cardiopulmonary disease. Aortic Atherosclerosis (ICD10-I70.0). Hyperinflation and interstitial thickening, most consistent with COPD/chronic bronchitis. Electronically Signed   By: Abigail Miyamoto M.D.   On: 11/07/2017 19:37    Procedures Procedures (including critical care time)  Medications Ordered in ED Medications  ipratropium-albuterol (DUONEB) 0.5-2.5 (3) MG/3ML nebulizer solution 3 mL (has no administration in time range)     Initial Impression / Assessment and Plan / ED Course  I have reviewed the triage vital signs and the nursing notes.  Pertinent labs & imaging results that were available during my care of the patient were reviewed by me and considered in my medical decision making (see chart for details).    Discussed pt presentation and exam findings with Dr. Laverta Baltimore, who personally evaluated the patient and agrees with the plan to discharge pt to f/u with the afib clinic tomorrow.   Final Clinical Impressions(s) / ED Diagnoses  Final diagnoses:  Heart palpitations  Hypoglycemia   Patient presenting with  chest pain and dyspnea on exertion. VSS, afebrile. Cardiac exam is benign. RRR. No tachycardia. And NSR on monitor. No murmurs. Lungs with some course breath sounds/wheezing with expiration.  CBC with mild anemia, appears stable.  BMP WNL. Trop negative.   CXR with no acute cardiopulmonary disease, hyperinflation and interstitial thickening most consistent with COPD vs chronic bronchitis. Duo neb given in the ED. Suspect chronic changes from COPD. Less likely acute exacerbation.   ECG with NSR, HR 93. Has prolonged PR and ST changes that are unchanged from previous. No STEMI.  sxs sound atypical for ACS or other emergent cardiac/pulmonary process at this time. Suspect sxs due to Afib.  While in ED BS noted to be 42. He was given food and juice and this was rechecked. Repeat was 44. Will give pt PO food and recheck BS every hour until stabilized.  Pt care signed out to Antonietta Breach, PA-C with plan to follow up on BS. Once BS stabilized, pt safe for d/c home with plan to f/u with Afib clinic tomorrow for his already scheduled appt.   ED Discharge Orders    None       Bishop Dublin 11/07/17 2245    Margette Fast, MD 11/08/17 [phone]

## 2017-11-07 NOTE — ED Notes (Signed)
Pt wants to leave. Pt speaks with this EMT and Nurse First. Pt brought back to triage to remove IV. NP and triage RN speak with pt one more time and pt agrees to stay. Pt brought to room in ED.

## 2017-11-07 NOTE — ED Notes (Signed)
Pt continues to feel weak. ED PA aware of pt blood sugar. Will cont to monitor.

## 2017-11-07 NOTE — ED Notes (Signed)
PA made aware pt CBG remains low. Pt given more graham crackers, peanut butter and orange juice.

## 2017-11-07 NOTE — ED Triage Notes (Signed)
Pt reports heart palpitations. Left hospital on Sunday for same thing. Pt will go into Afib. EMS P 80-100 sinus. Denies Cp, SOB, dizziness. Reports it has been going on all day. Pt was supposed to f/u with doctor tomorrow. EMS VSS. CBG 98.

## 2017-11-07 NOTE — Discharge Instructions (Addendum)
Please continue taking your medications for your atrial fibrillation.   You need to follow up in the Afib clinic tomorrow morning as scheduled.  Eat regular meals when taking your insulin.  Please return to the emergency room for any new or worsening symptoms in the meantime.

## 2017-11-07 NOTE — ED Notes (Signed)
Pt given [place] sandwich, orange juice, graham crackers and peanut butter per PA request. Will recheck CBG.

## 2017-11-07 NOTE — ED Provider Notes (Signed)
Patient placed in Quick Look pathway, seen and evaluated   Chief Complaint: palpations   HPI:  [name] is a 77 y.o. male who presents to the ED via EMS with palpations. Patient reports he was here in the ED 3 days ago for the same thing and had all kinds of blood work done and was dc home.  Patient reports hx of A-fib and has appointment tomorrow in the clinic. Patient denies chest pain or shortness of breath. CBG 98.   ROS: CV: palpations  Physical Exam:  BP 125/67 (BP Location: Right Arm)   Pulse (!) 106   Temp 97.8 F (36.6 C) (Oral)   Resp 18   Ht 5\' 6"  (1.676 m)   Wt 79 kg   SpO2 97%   BMI 28.11 kg/m    Gen: No distress  Neuro: Awake and Alert  Skin: Warm and dry  Heart: tachycardia, regular  Lungs: ronchi       Initiation of care has begun. The patient has been counseled on the process, plan, and necessity for staying for the completion/evaluation, and the remainder of the medical screening examination    Ashley Murrain, NP 11/08/17 1850    Carmin Muskrat, MD 11/12/17 0002

## 2017-11-07 NOTE — ED Provider Notes (Signed)
10:48 PM Patient care assumed at shift change.  Presenting for palpitations, but EKG showing sinus rhythm.  He has a negative troponin.  Has been found to be persistently hypoglycemic.  The patient has had 4 cartons of orange juice, 2 sandwiches, multiple other snacks.  Most recent CBG 50.  Will add ampule of D50.  He is prescribed to take 8 units Lantus BID.  12:08 AM CBG has improved with IV D50.  Plan to repeat CBG at 0030.  Patient reassessed.  Mentating well.  He does seem uncomfortable and is complaining of indigestion.  He describes a burning sensation in his epigastrium with a "sour" taste in his throat.  Will order GI cocktail.  States that he only took 8 units of his Lantus this morning.  Does state that he ate breakfast as normal and had a sandwich around lunchtime.  Was unable to eat anything else since arriving in the ED.  Denies any changes to his insulin regimen.  Did not take his nightly dose.  2:48 AM Repeat CBGs and vital signs have been stable.  Patient stable for discharge at this time.  Encouraged close PCP follow up.   Vitals:   11/08/17 0128 11/08/17 0145 11/08/17 0230 11/08/17 0240  BP: 106/61 113/74  (!) 122/55  Pulse: 77 81 89 88  Resp: 20 (!) 23 (!) 22 20  Temp:      TempSrc:      SpO2: 98% 96% 97% 99%  Weight:      Height:          Antonietta Breach, PA-C 11/08/17 0249    Daleen Bo, MD 11/12/17 [phone]

## 2017-11-07 NOTE — ED Notes (Signed)
Pt called out asking for food stating he felt dizzy and as is his sugar was low. Pt informed we will check his sugar, but nothing to eat as of right now.

## 2017-11-08 ENCOUNTER — Other Ambulatory Visit: Payer: Self-pay

## 2017-11-08 ENCOUNTER — Inpatient Hospital Stay (HOSPITAL_COMMUNITY)
Admission: EM | Admit: 2017-11-08 | Discharge: 2017-11-10 | DRG: 309 | Disposition: A | Discharge status: Home Health | Payer: Medicare Other | Source: Ambulatory Visit | Attending: Family Medicine | Admitting: Family Medicine

## 2017-11-08 ENCOUNTER — Encounter (HOSPITAL_COMMUNITY): Payer: Self-pay | Admitting: Emergency Medicine

## 2017-11-08 ENCOUNTER — Encounter (HOSPITAL_COMMUNITY): Payer: Self-pay | Admitting: Nurse Practitioner

## 2017-11-08 ENCOUNTER — Ambulatory Visit (HOSPITAL_COMMUNITY)
Admission: RE | Admit: 2017-11-08 | Discharge: 2017-11-08 | Disposition: A | Discharge status: Home / Self Care | Payer: Medicare Other | Source: Ambulatory Visit | Attending: Nurse Practitioner | Admitting: Nurse Practitioner

## 2017-11-08 DIAGNOSIS — Z8249 Family history of ischemic heart disease and other diseases of the circulatory system: Secondary | ICD-10-CM | POA: Diagnosis not present

## 2017-11-08 DIAGNOSIS — I480 Paroxysmal atrial fibrillation: Principal | ICD-10-CM | POA: Diagnosis present

## 2017-11-08 DIAGNOSIS — E11649 Type 2 diabetes mellitus with hypoglycemia without coma: Secondary | ICD-10-CM | POA: Diagnosis not present

## 2017-11-08 DIAGNOSIS — I5032 Chronic diastolic (congestive) heart failure: Secondary | ICD-10-CM | POA: Diagnosis present

## 2017-11-08 DIAGNOSIS — N400 Enlarged prostate without lower urinary tract symptoms: Secondary | ICD-10-CM | POA: Diagnosis not present

## 2017-11-08 DIAGNOSIS — I2510 Atherosclerotic heart disease of native coronary artery without angina pectoris: Secondary | ICD-10-CM | POA: Diagnosis not present

## 2017-11-08 DIAGNOSIS — Z79899 Other long term (current) drug therapy: Secondary | ICD-10-CM | POA: Diagnosis not present

## 2017-11-08 DIAGNOSIS — D649 Anemia, unspecified: Secondary | ICD-10-CM | POA: Diagnosis not present

## 2017-11-08 DIAGNOSIS — Z7982 Long term (current) use of aspirin: Secondary | ICD-10-CM | POA: Diagnosis not present

## 2017-11-08 DIAGNOSIS — Z8711 Personal history of peptic ulcer disease: Secondary | ICD-10-CM

## 2017-11-08 DIAGNOSIS — Z961 Presence of intraocular lens: Secondary | ICD-10-CM | POA: Diagnosis present

## 2017-11-08 DIAGNOSIS — Z903 Acquired absence of stomach [part of]: Secondary | ICD-10-CM | POA: Diagnosis not present

## 2017-11-08 DIAGNOSIS — Z8701 Personal history of pneumonia (recurrent): Secondary | ICD-10-CM

## 2017-11-08 DIAGNOSIS — M549 Dorsalgia, unspecified: Secondary | ICD-10-CM | POA: Diagnosis not present

## 2017-11-08 DIAGNOSIS — F17210 Nicotine dependence, cigarettes, uncomplicated: Secondary | ICD-10-CM | POA: Diagnosis not present

## 2017-11-08 DIAGNOSIS — Z9049 Acquired absence of other specified parts of digestive tract: Secondary | ICD-10-CM

## 2017-11-08 DIAGNOSIS — Z8673 Personal history of transient ischemic attack (TIA), and cerebral infarction without residual deficits: Secondary | ICD-10-CM | POA: Diagnosis not present

## 2017-11-08 DIAGNOSIS — G8929 Other chronic pain: Secondary | ICD-10-CM | POA: Diagnosis not present

## 2017-11-08 DIAGNOSIS — Z9842 Cataract extraction status, left eye: Secondary | ICD-10-CM

## 2017-11-08 DIAGNOSIS — I4891 Unspecified atrial fibrillation: Secondary | ICD-10-CM | POA: Diagnosis not present

## 2017-11-08 DIAGNOSIS — R002 Palpitations: Secondary | ICD-10-CM | POA: Diagnosis not present

## 2017-11-08 DIAGNOSIS — E119 Type 2 diabetes mellitus without complications: Secondary | ICD-10-CM | POA: Diagnosis not present

## 2017-11-08 DIAGNOSIS — D509 Iron deficiency anemia, unspecified: Secondary | ICD-10-CM | POA: Diagnosis not present

## 2017-11-08 DIAGNOSIS — I110 Hypertensive heart disease with heart failure: Secondary | ICD-10-CM | POA: Diagnosis not present

## 2017-11-08 DIAGNOSIS — I4892 Unspecified atrial flutter: Secondary | ICD-10-CM | POA: Diagnosis not present

## 2017-11-08 DIAGNOSIS — I25118 Atherosclerotic heart disease of native coronary artery with other forms of angina pectoris: Secondary | ICD-10-CM | POA: Diagnosis not present

## 2017-11-08 DIAGNOSIS — J449 Chronic obstructive pulmonary disease, unspecified: Secondary | ICD-10-CM | POA: Diagnosis present

## 2017-11-08 DIAGNOSIS — Z9841 Cataract extraction status, right eye: Secondary | ICD-10-CM

## 2017-11-08 DIAGNOSIS — E785 Hyperlipidemia, unspecified: Secondary | ICD-10-CM | POA: Diagnosis not present

## 2017-11-08 LAB — CBC
HCT: 33.9 % — ABNORMAL LOW (ref 39.0–52.0)
HCT: 35.9 % — ABNORMAL LOW (ref 39.0–52.0)
HEMOGLOBIN: 10.6 g/dL — AB (ref 13.0–17.0)
Hemoglobin: 11.4 g/dL — ABNORMAL LOW (ref 13.0–17.0)
MCH: 29.3 pg (ref 26.0–34.0)
MCH: 29.5 pg (ref 26.0–34.0)
MCHC: 31.3 g/dL (ref 30.0–36.0)
MCHC: 31.8 g/dL (ref 30.0–36.0)
MCV: 93.6 fL (ref 78.0–100.0)
MCV: 92.8 fL (ref 78.0–100.0)
PLATELETS: 237 10*3/uL (ref 150–400)
Platelets: 254 10*3/uL (ref 150–400)
RBC: 3.62 MIL/uL — ABNORMAL LOW (ref 4.22–5.81)
RBC: 3.87 MIL/uL — ABNORMAL LOW (ref 4.22–5.81)
RDW: 14.9 % (ref 11.5–15.5)
RDW: 15.1 % (ref 11.5–15.5)
WBC: 9.3 10*3/uL (ref 4.0–10.5)
WBC: 8.5 10*3/uL (ref 4.0–10.5)

## 2017-11-08 LAB — GLUCOSE, CAPILLARY
GLUCOSE-CAPILLARY: 126 mg/dL — AB (ref 70–99)
Glucose-Capillary: 151 mg/dL — ABNORMAL HIGH (ref 70–99)

## 2017-11-08 LAB — MAGNESIUM: MAGNESIUM: 2.1 mg/dL (ref 1.7–2.4)

## 2017-11-08 LAB — BRAIN NATRIURETIC PEPTIDE: B NATRIURETIC PEPTIDE 5: 267.5 pg/mL — AB (ref 0.0–100.0)

## 2017-11-08 LAB — BASIC METABOLIC PANEL
Anion gap: 8 (ref 5–15)
BUN: 11 mg/dL (ref 8–23)
CALCIUM: 8.1 mg/dL — AB (ref 8.9–10.3)
CHLORIDE: 105 mmol/L (ref 98–111)
CO2: 25 mmol/L (ref 22–32)
CREATININE: 0.9 mg/dL (ref 0.61–1.24)
Glucose, Bld: 145 mg/dL — ABNORMAL HIGH (ref 70–99)
Potassium: 3.9 mmol/L (ref 3.5–5.1)
SODIUM: 138 mmol/L (ref 135–145)

## 2017-11-08 LAB — CREATININE, SERUM
CREATININE: 0.87 mg/dL (ref 0.61–1.24)
GFR calc Af Amer: >60 mL/min (ref >60)
GFR calc non Af Amer: >60 mL/min (ref >60)

## 2017-11-08 LAB — APTT: APTT: 31 s (ref 24–36)

## 2017-11-08 LAB — HEMOGLOBIN A1C
Hgb A1c MFr Bld: 5.6 % (ref 4.8–5.6)
MEAN PLASMA GLUCOSE: 114.02 mg/dL

## 2017-11-08 LAB — CBG MONITORING, ED
GLUCOSE-CAPILLARY: 142 mg/dL — AB (ref 70–99)
GLUCOSE-CAPILLARY: 159 mg/dL — AB (ref 70–99)
GLUCOSE-CAPILLARY: 175 mg/dL — AB (ref 70–99)
GLUCOSE-CAPILLARY: 202 mg/dL — AB (ref 70–99)

## 2017-11-08 LAB — TROPONIN I: Troponin I: <0.03 ng/mL (ref <0.03)

## 2017-11-08 LAB — PROTIME-INR
INR: 0.97
Prothrombin Time: 12.8 seconds (ref 11.4–15.2)

## 2017-11-08 LAB — TSH: TSH: 2.693 u[IU]/mL (ref 0.350–4.500)

## 2017-11-08 MED ORDER — PANTOPRAZOLE SODIUM 40 MG PO TBEC
40.0000 mg | DELAYED_RELEASE_TABLET | Freq: Two times a day (BID) | ORAL | Status: DC
  Administered 2017-11-09 – 2017-11-10 (×3): 40 mg via ORAL
  Filled 2017-11-08 (×3): qty 1

## 2017-11-08 MED ORDER — ASPIRIN EC 81 MG PO TBEC
81.0000 mg | DELAYED_RELEASE_TABLET | Freq: Every day | ORAL | Status: DC
  Administered 2017-11-09 – 2017-11-10 (×2): 81 mg via ORAL
  Filled 2017-11-08 (×2): qty 1

## 2017-11-08 MED ORDER — ACETAMINOPHEN 325 MG PO TABS
650.0000 mg | ORAL_TABLET | ORAL | Status: DC | PRN
  Administered 2017-11-08: 650 mg via ORAL
  Filled 2017-11-08: qty 2

## 2017-11-08 MED ORDER — DILTIAZEM HCL-DEXTROSE 100-5 MG/100ML-% IV SOLN (PREMIX)
5.0000 mg/h | Freq: Once | INTRAVENOUS | Status: AC
  Administered 2017-11-08: 5 mg/h via INTRAVENOUS
  Filled 2017-11-08: qty 100

## 2017-11-08 MED ORDER — PRAVASTATIN SODIUM 40 MG PO TABS
40.0000 mg | ORAL_TABLET | Freq: Every evening | ORAL | Status: DC
  Administered 2017-11-08 – 2017-11-09 (×2): 40 mg via ORAL
  Filled 2017-11-08 (×2): qty 1

## 2017-11-08 MED ORDER — DILTIAZEM HCL-DEXTROSE 100-5 MG/100ML-% IV SOLN (PREMIX)
5.0000 mg/h | INTRAVENOUS | Status: DC
  Administered 2017-11-08: 5 mg/h via INTRAVENOUS
  Filled 2017-11-08: qty 100

## 2017-11-08 MED ORDER — COENZYME Q10 30 MG PO CAPS: 30.0000 mg | ORAL_CAPSULE | Freq: Every day | ORAL | Status: DC

## 2017-11-08 MED ORDER — TAMSULOSIN HCL 0.4 MG PO CAPS
0.4000 mg | ORAL_CAPSULE | Freq: Every day | ORAL | Status: DC
  Administered 2017-11-09 – 2017-11-10 (×2): 0.4 mg via ORAL
  Filled 2017-11-08 (×2): qty 1

## 2017-11-08 MED ORDER — INSULIN ASPART 100 UNIT/ML ~~LOC~~ SOLN
0.0000 [IU] | Freq: Three times a day (TID) | SUBCUTANEOUS | Status: DC
  Administered 2017-11-09: 2 [IU] via SUBCUTANEOUS
  Administered 2017-11-09 – 2017-11-10 (×3): 3 [IU] via SUBCUTANEOUS
  Administered 2017-11-10: 2 [IU] via SUBCUTANEOUS

## 2017-11-08 MED ORDER — GI COCKTAIL ~~LOC~~
30.0000 mL | Freq: Once | ORAL | Status: AC
  Administered 2017-11-08: 30 mL via ORAL
  Filled 2017-11-08: qty 30

## 2017-11-08 MED ORDER — POLYETHYLENE GLYCOL 3350 17 G PO PACK: 17.0000 g | PACK | ORAL | Status: DC | PRN

## 2017-11-08 MED ORDER — DICLOFENAC SODIUM 1 % TD GEL
4.0000 g | Freq: Three times a day (TID) | TRANSDERMAL | Status: DC | PRN
  Administered 2017-11-09 – 2017-11-10 (×2): 4 g via TOPICAL
  Filled 2017-11-08: qty 100

## 2017-11-08 MED ORDER — ENOXAPARIN SODIUM 40 MG/0.4ML ~~LOC~~ SOLN
40.0000 mg | SUBCUTANEOUS | Status: DC
  Administered 2017-11-08 – 2017-11-09 (×2): 40 mg via SUBCUTANEOUS
  Filled 2017-11-08 (×2): qty 0.4

## 2017-11-08 MED ORDER — ONDANSETRON HCL 4 MG/2ML IJ SOLN: 4.0000 mg | Freq: Four times a day (QID) | INTRAMUSCULAR | Status: DC | PRN

## 2017-11-08 NOTE — ED Notes (Signed)
EKG not crossing over in Epic. Pt information entered correctly in [facility] monitor, EKG done x3, given to Zackowski-MD.

## 2017-11-08 NOTE — ED Notes (Signed)
EDP at bedside, aware of low BP reading while PT was transferring from bed to commode. Systolic BP is now in the 90s. Dr. Sondra Barges requests that cardizem be restarted at 5mg / hr.

## 2017-11-08 NOTE — Progress Notes (Signed)
Pt presented to the afib clinic with a probable flutter with v rate of 349, BP 611 systolic and pt was diaphoretic. He was sent to the ER as he was unstable. Prior use of flecainide which was stopped recently and he is not on anticoagulation for past bleeding issues.

## 2017-11-08 NOTE — H&P (Addendum)
[facility] Admission History and Physical Service Pager: [phone]  Patient name: [name] Medical record number: [id number] Date of birth: 12-10-1940 Age: 77 y.o. Gender: male  Primary Care Provider: [name], MD Consultants: Cardiology Code Status: Full  Chief Complaint: Chest tightness/pressure  Assessment and Plan: [name] is a 77 y.o. male presenting to the ED on 8/15 with chest pain. PMH is significant for A-fib(S/P ablation 2012, diltiazem daily), COPD, CAD, diastolic CHF, Y0VP, HTN, HLD, anemia.    Paroxysmal Afib/flutter with RVR Was going to be seen at A. fib clinic when he was sent to the ED due to A. fib with RVR.  He arrived in the ED with a heart rate 136 with supraventricular tachycardia. He was put on a diltiazem drip which significantly improved his heart rates to the 90s and 100s.  Some low blood pressures were noted on the dilt drip with maps in the high 50s low 60s. Pt has a history of A. fib/a flutter which was treated on 08/06/2010 with radiofrequency ablation.  Since that time is been followed by cardiology.  Since last visit on 05/04/2017 he seems to have deteriorated.  At that time he was in sinus rhythm and was controlled on flecainide, metoprolol and diltiazem.  He reported on admission that the flecainide had been discontinued last Sunday because it "made him crazy" he appeared to mean it made him feel tremulous and anxious.  In the past week he had been taking diltiazem 120 mg daily for A. Fib. Previously on warfarin for anticoagulation although noted to have been discontinued 11/2016 due to anemia from recurrent GI bleeds, continued on ASA 81mg . Current CHADSVASc score 8. HAS BLED score 3 with high risk of bleeds. Unclear inciting event leading to current Afib episode. Question medication compliance as patient is unclear of dosages and names of medications on admission and relies on daughter to fill pill pack despite  reported tension between the two as well as multiple hours per day at home unsupervised despite patient's vocalization he cannot adequately care for himself at home. Does report recent bouts of bronchitis which could also contribute. Also noted to be hypoglycemia in ED yesterday triggering palpitations, however glucose wnl on admission. -Admit to telemetry, attending Dr. Owens Shark -Continue diltiazem drip, consider transitioning to PO tomorrow if stabilized -Continue to monitor EKG -Consult cardiology -Trend troponin -Follow-up lipid panel, A1c, TSH, Mag -monitor electrolytes -PT/INR -Monitor vitals, up with assistance - PT/OT  CAD/Diastolic CHF Last echo on 10/05/2017 with an EF of 55 to 60%, mild regurgitation of the aortic and mitral valves, mild dilation of left atrium.  He denies any history of MI, bypass although under went [facility] 10/05/2017 notable for 40% stenosis of distal RCA lesion and 95% stenosis of Ost 1st sept lesion, without placement of stent and recommendation for medical management. This appears to be well controlled within the past month he has no complaints of shortness of breath at this time and denies lower extremity edema.  CHF is currently controlled at home with: Metoprolol succinate 12.5 mg daily, no diuretics. -Follow-up BNP -Cautious use of fluids -Monitor fluid status on physical exam -Holding metoprolol, awaiting cardiology recommendations - monitor I&Os  COPD [name] acknowledges having a cough for the past 3 to 5 days with a significant nonbloody sputum production.  He is not endorsing significant shortness of breath at this time.  He has had recent ED visits for bronchitis (8/4, 8/5).  His vitals show respiration and temperature  within normal limits.  His physical exam shows normal respiratory effort and satting well on room air, auscultation with rales in the left middle lobe.  WBC: 9.3> 8.5. -Consider duo nebs and steroids if respiratory status worsens concerning  for COPD exacerbation -Continue to monitor  T2DM Presented to the ED yesterday 8/15 with palpitations secondary to hypoglycemia.  During his time in the ED he was given food, juice and D50 to bring his blood glucose back between 150 and 200.  Last A1c: 5.7(04/2017) Metformin 1000mg  BID, lantus 8u BID. Will obtain A1c this admission and likely titrate down diabetic regimen on discharge given previous low A1c with current goal 7-8. -Holding metformin -Moderate SSI -follow-up A1c  HLD Last lipid panel 06/2013: Cholesterol: 155, LDL: 72, HDL: 37.  On pravastatin 40 at home -Follow-up lipid panel -Continue pravastatin 40  Iron deficiency anemia Hb 10.6 on admission, MCV 93.6. Baseline appears to be ~11-12.  Hospitalized 04/2017 for symptomatic anemia with Hb 5.4 due to recurrent GI bleeds, given 2u pRBC at that time. Last iron study 10/10/2017: Iron: 26, TIBC: 340, saturation ratio: 8, UI BC: 314, ferritin 14 consistent with iron deficiency anemia. Followed by Heme/Onc for IDA 2/2 intermittent GI bleed and malabsorption on Protonix and treated with IV iron and PRN supportive transfusions. Last appointment was 09/2017 with Hb 10.9 at that time. -Consider iron supplementation.  Chronic back pain Stable. Takes hydrocodone 3.25 q8h PRN (usually takes twice per day) for back pain.  -Tylenol 650 -Consider home medication for breakthrough pain  BPH Stable. Home medication includes: Tamsulosin -Continue tamsulosin - monitor I&Os  FEN/GI: General diet Prophylaxis: lovenox  Disposition: admit to tele for monitoring, attending Dr. Owens Shark  History of Present Illness:  [name] is a 77 y.o. male presenting to the ED on 8/15 with chest pain. PMH is significant for A-fib(diltiazem daily), COPD, CAD, diastolic CHF, D3TT, HTN, HLD, anemia.    [name] has not been feeling well for about the past month.  In that time, he has had multiple visits to the emergency room for chest pain (7/24 headache,  8/4 bronchitis, 8/5 bronchitis, 8/11 A. fib, 8/14 heart palpitations).  He was most recently seen in the ED on 8/14 for chest pain.  At that time ACS was ruled out and he was told to visit the A. fib clinic on 8/15.  On 8/15 he arrived at the A. fib clinic and was checked in.  During check-in they found that he had a heart rate of 180 bpm and he was sent to the ED.   In the emergency room on 8/15 continues to endorse the same chest pain he was experiencing the previous day in the ED.  He describes the chest pain as a pressure that seems to be mainly on the right side of his chest and radiates roughly to midline.  No radiation to arms or back.  The intensity is a 9-10/10.  It seems to be worse with exertion as he notices it more when he is up and moving.  He does not have any medication that he takes for chest pain.  Apparently, this is similar to chest pain he has had in the past related to his A. fib.  He reports strict adherence to his current medication regimen (takes medications from pill pack organized by daughter) although recognizes he does not have full ability to care for himself at home due to his functional status.  Denies chief concern, patient also noted a cough that  has been present for the past 4 to 5 days.  He reports coughing up yellow sputum without blood.  This appears to be a newer development (he was recently diagnosed with bronchitis).  His only other complaints at this time is his back pain.  His back pain is chronic and is managed at home with hydrocodone 5/325 every 8 hours as needed.  He reports that he rarely takes all 3 pills in the day.  For the last several days he has needed 1 or no pills at all.  [name] living situation at the moment appears to be a difficult situation.  Currently lives with her daughter and son-in-law who are very rarely home.  They are both out of the house for maybe 14 hours a day and cannot care for him at that time.  He cannot cook for himself or  shower himself  though he can care for other ADLs including using the bathroom and eating.  His daughter currently organizes his medication for him to take at a later time.  Has had home health in the past but does not have currently.  He walks with a walker and does not drive.  He took a taxi to get to the hospital.  Stated several times that he does not feel safe at home because there is no one at home to care for him.  He does not feel any threat of violence at home and denied any physical abuse. Also reports daughter "gets worked up" frequently and has tried to evict him several times although reminds her he has paid his rent up to September. Is asking on admission for admitting providers to help him find a different place to live.  Review Of Systems: Per HPI with the following additions:   Review of Systems  Constitutional: Positive for diaphoresis. Negative for fever.  HENT: Positive for sore throat.   Respiratory: Positive for cough and sputum production. Negative for hemoptysis and shortness of breath.   Gastrointestinal: Negative for abdominal pain, constipation, diarrhea, nausea and vomiting.  Genitourinary: Positive for frequency. Negative for dysuria.  Musculoskeletal: Positive for back pain.  Neurological: Positive for tremors and headaches.    Patient Active Problem List   Diagnosis Date Noted  . Chest pain with moderate risk of acute coronary syndrome 10/04/2017  . HTN (hypertension) 05/21/2017  . Diabetes mellitus type II 05/21/2017  . Diastolic CHF, chronic ([place]) 05/21/2017  . COPD (chronic obstructive pulmonary disease) ([place]) 05/21/2017  . H/O atrial flutter 05/21/2017  . Coronary disease/nonobstructive 05/21/2017  . Acute blood loss anemia 10/02/2016  . Abdominal pain 10/02/2016  . Constipation 10/02/2016  . Left lower quadrant pain   . Coagulopathy ([place])   . Heme positive stool   . Symptomatic anemia 07/10/2016  . Syncope 05/15/2015  . Cervical disc disease  05/15/2015  . Abnormal urinalysis 05/15/2015  . Anemia 05/15/2015  . History of CVA (cerebrovascular accident) 04/03/2014  . Near syncope 02/15/2014  . Pre-syncope 02/15/2014  . Benign neoplasm of rectum and anal canal 12/02/2013  . Upper GI bleeding 11/30/2013  . Tobacco use disorder 11/30/2013  . Anticoagulation goal of INR 2 to 3, for PAF - CHA2DS2Vasc = 7 08/15/2013  . Type 2 diabetes mellitus ([place]) 01/22/2013  . Tobacco abuse counseling 12/15/2012  . Hyperlipidemia   . Obesity (BMI 30-39.9) 12/14/2012  . Atrial flutter - status post CTI ablation 07/29/2010  . Essential hypertension 07/29/2010  . Chronic diastolic congestive heart failure ([place]) 07/29/2010  . Coronary  artery disease, non-occlusive 07/29/2010    Past Medical History: Past Medical History:  Diagnosis Date  . Anemia    takes Ferrous Sulfate daily  . Arthritis    "all over"  . Balance problem 01/2014  . CAP (community acquired pneumonia) 09/18/2014  . Cervical radiculopathy due to degenerative joint disease of spine   . COPD (chronic obstructive pulmonary disease) ([employer])   . Coronary artery disease, non-occlusive    a. 03/2010 Nonocclusive disease by cath, performed for ST elevations on ECG;  b. 06/2013 Lexi MV: EF 60%, no ischemia.  . Diabetes mellitus type II    takes Metformin and Lantus daily  . Diastolic CHF, chronic ([place])    a. 12/2012 EF 55-60%, diast dysfxn, triv MR, mildly dil LA/RA.  [name] History of blood transfusion 1982   "when I had stomach OR"  . History of bronchitis    1998  . History of gastric ulcer   . HTN (hypertension)    takes Diltiazem daily  . Hyperlipidemia    takes Pravastatin daily  . Joint pain   . PAF (paroxysmal atrial fibrillation) ([facility])    Recurrent after atrial flutter (a. 07/2010 Status post caval tricuspid isthmus ablation by Dr. Midge Aver Metoprolol daily), currently controlled on flecainide plus diltiazem and Coumadin  . Weakness    numbness and tingling both hands     Past Surgical History: Past Surgical History:  Procedure Laterality Date  . ATRIAL ABLATION SURGERY  08/05/10   CTI ablation for atrial flutter by JA  . CARDIAC CATHETERIZATION  2012   nl LV function, no occlusive CAD, PAF  . CARDIOVERSION  12/07/2010    Successful direct current cardioversion with atrial fibrillation to normal sinus rhythm  . CARPAL TUNNEL RELEASE Bilateral 01/30/2014   Procedure: BILATERAL CARPAL TUNNEL RELEASE;  Surgeon: Marianna Payment, MD;  Location: [facility];  Service: Orthopedics;  Laterality: Bilateral;  . CATARACT EXTRACTION W/ INTRAOCULAR LENS  IMPLANT, BILATERAL Bilateral   . COLONOSCOPY [name] 12/02/2013   Procedure: COLONOSCOPY;  Surgeon: Irene Shipper, MD;  Location: [facility];  Service: Endoscopy;  Laterality: [name];  . ESOPHAGOGASTRODUODENOSCOPY [name] 09/22/2014   Procedure: ESOPHAGOGASTRODUODENOSCOPY (EGD);  Surgeon: Ronald Lobo, MD;  Location: [facility] ENDOSCOPY;  Service: Endoscopy;  Laterality: [name];  . ESOPHAGOGASTRODUODENOSCOPY [name] 07/11/2016   Procedure: ESOPHAGOGASTRODUODENOSCOPY (EGD);  Surgeon: Doran Stabler, MD;  Location: [facility] ENDOSCOPY;  Service: Endoscopy;  Laterality: [name];  . INCISION AND DRAINAGE ABSCESS / HEMATOMA OF BURSA / KNEE / THIGH Left 1998   knee  . KNEE BURSECTOMY Left 1998  . LAPAROSCOPIC CHOLECYSTECTOMY  03/2010  . LEFT HEART CATH AND CORONARY ANGIOGRAPHY [name] 10/05/2017   Procedure: LEFT HEART CATH AND CORONARY ANGIOGRAPHY;  Surgeon: Martinique, Peter M, MD;  Location: [facility] CV LAB;  Service: Cardiovascular;  Laterality: [name];  . NM MYOVIEW LTD  07/22/2013   Normal EF ~60%, no ischemia or infarction.  [name] PARTIAL GASTRECTOMY  1982   subtotal; "took out 30% for ulcers"  . TRANSTHORACIC ECHOCARDIOGRAM  02/16/2014   EF 60%, no RWMA. - otherwise normal  . YAG LASER APPLICATION Bilateral     Social History: Social History   Tobacco Use  . Smoking status: Current Every Day Smoker    Packs/day: 0.50    Years: 57.00    Pack years:  28.50    Types: Cigarettes  . Smokeless tobacco: Never Used  . Tobacco comment: He's been smoking between 0.5 and 2 ppd since age 76.  Substance Use Topics  .  Alcohol use: No    Alcohol/week: 0.0 standard drinks    Comment: "quit drinking in 1986"  . Drug use: No   Additional social history: See HPI for notes about living situation Please also refer to relevant sections of EMR.  Family History: Family History  Problem Relation Age of Onset  . Cancer Mother   . Heart attack Father   . Heart attack Brother      Allergies and Medications: Allergies  Allergen Reactions  . Cyclobenzaprine Other (See Comments)    Unsteady gait   No current facility-administered medications on file prior to encounter.    Current Outpatient Medications on File Prior to Encounter  Medication Sig Dispense Refill  . acetaminophen (TYLENOL) 325 MG tablet Take 325 mg by mouth every 6 (six) hours as needed for mild pain.    [name] albuterol (PROVENTIL HFA;VENTOLIN HFA) 108 (90 Base) MCG/ACT inhaler Inhale 2 puffs into the lungs every 4 (four) hours as needed for wheezing or shortness of breath. 1 Inhaler 0  . aspirin EC 81 MG tablet Take 81 mg by mouth daily.    [name] co-enzyme Q-10 30 MG capsule Take 30 mg by mouth daily.     [name] diltiazem (CARDIZEM CD) 120 MG 24 hr capsule Take 1 capsule (120 mg total) by mouth daily. 90 capsule 3  . HYDROcodone-acetaminophen (NORCO/VICODIN) 5-325 MG tablet Take 1 tablet by mouth every 8 (eight) hours as needed for moderate pain.    . hydrocortisone cream 1 % Apply 1 application topically 2 (two) times daily.     . Insulin Glargine (LANTUS) 100 UNIT/ML Solostar Pen Inject 8 Units into the skin 2 (two) times daily.    . metFORMIN (GLUCOPHAGE) 1000 MG tablet Take 1,000 mg by mouth 2 (two) times daily with a meal.     . metoprolol succinate (TOPROL-XL) 25 MG 24 hr tablet TAKE 1/2 TABLET  (12.5 MG TOTAL) BY MOUTH DAILY. (Patient taking differently: Take 12.5 mg by mouth every evening.  ) 45 tablet 3  . Multiple Vitamin (MULTIVITAMIN WITH MINERALS) TABS tablet Take 1 tablet by mouth 2 (two) times daily.     . nitroGLYCERIN (NITROSTAT) 0.4 MG SL tablet Place 1 tablet (0.4 mg total) under the tongue every 5 (five) minutes x 3 doses as needed for chest pain. 25 tablet 9  . pantoprazole (PROTONIX) 40 MG tablet Take 1 tablet (40 mg total) by mouth 2 (two) times daily before a meal.    . polyethylene glycol (MIRALAX / GLYCOLAX) packet Take 17 g by mouth 2 (two) times daily. (Patient taking differently: Take 17 g by mouth as needed for moderate constipation. ) 14 each 0  . pravastatin (PRAVACHOL) 40 MG tablet Take 1 tablet (40 mg total) by mouth every evening. 90 tablet 3  . tamsulosin (FLOMAX) 0.4 MG CAPS capsule Take 0.4 mg by mouth daily.    . benzonatate (TESSALON) 100 MG capsule Take 1 capsule (100 mg total) by mouth every 8 (eight) hours. (Patient not taking: Reported on 11/08/2017) 21 capsule 0    Objective: BP (!) 92/47   Pulse 91   Temp 98.3 F (36.8 C)   Resp 19   SpO2 100%  Exam: Physical Exam  Constitutional: He is oriented to person, place, and time. No distress.  Neck: Neck supple.  Cardiovascular: Intact distal pulses. Exam reveals no gallop and no friction rub.  No murmur heard. Tachycardic, irregularly irregular  Pulmonary/Chest: Effort normal. No stridor. No respiratory distress. He has no wheezes.  He has rales (right middle lobe).  Abdominal: Soft. Bowel sounds are normal. He exhibits no distension. There is tenderness (tender to palpation in LUQ (chronic)).  Musculoskeletal: He exhibits no edema, tenderness or deformity.  Lymphadenopathy:    He has cervical adenopathy.  Neurological: He is alert and oriented to person, place, and time. Cranial nerve deficit: anisocoria. 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 He is not diaphoretic. No erythema.  Psychiatric: He has a normal mood and affect. His behavior is normal.      Labs and Imaging: CBC BMET  Recent Labs  Lab 11/08/17 1313  WBC 9.3  HGB 10.6*  HCT 33.9*  PLT 237   Recent Labs  Lab 11/08/17 1313  NA 138  K 3.9  CL 105  CO2 25  BUN 11  CREATININE 0.90  GLUCOSE 145*  CALCIUM 8.1*     Dg Chest 2 View  Result Date: 11/07/2017 CLINICAL DATA:  Pt c/o palpitations x 1 day. Hx of COPD, CAD, DM, CHF, AND HTN. Pt is a current smoker. EXAM: CHEST - 2 VIEW COMPARISON:  11/04/2017 FINDINGS: Mild hyperinflation. Lower thoracic spondylosis. Numerous leads and wires project over the chest. Midline trachea. Normal heart size. Atherosclerosis in the transverse aorta. No pleural effusion or pneumothorax. Mild interstitial thickening is lower lobe predominant. No lobar consolidation. No congestive failure. Probable scarring in the left lung base laterally. Surgical clips project over the gastroesophageal junction. IMPRESSION: No acute cardiopulmonary disease. Aortic Atherosclerosis (ICD10-I70.0). Hyperinflation and interstitial thickening, most consistent with COPD/chronic bronchitis. Electronically Signed   By: Abigail Miyamoto M.D.   On: 11/07/2017 19:37   Dg Chest 2 View  Result Date: 11/04/2017 CLINICAL DATA:  Atrial fibrillation EXAM: CHEST - 2 VIEW COMPARISON:  10/28/2017 FINDINGS: The heart size and mediastinal contours are within normal limits. Minimal thoracic aortic atherosclerosis, stable in appearance. Mild emphysematous hyperinflation of the lungs without acute pulmonary consolidation, CHF, effusion or pneumothorax. The visualized skeletal structures are unremarkable. IMPRESSION: No active pulmonary disease. Aortic atherosclerosis. Pulmonary hyperinflation Electronically Signed   By: Ashley Royalty M.D.   On: 11/04/2017 03:21   Dg Chest 2 View  Result Date: 10/28/2017 CLINICAL DATA:  Cough and shortness of breath for 3 weeks. EXAM: CHEST - 2 VIEW COMPARISON:  10/17/2017 FINDINGS: The heart size and mediastinal contours are within normal limits. Aortic  atherosclerosis. Both lungs are clear. No evidence of pleural effusion. Surgical clips noted in the epigastric region. Mild mid and lower thoracic spine degenerative disc disease incidentally noted. IMPRESSION: Stable exam.  No active cardiopulmonary disease. Electronically Signed   By: Earle Gell M.D.   On: 10/28/2017 12:31   Dg Chest 2 View  Result Date: 10/17/2017 CLINICAL DATA:  77 year old with anxiety. EXAM: CHEST - 2 VIEW COMPARISON:  10/04/2017 and cardiac CTA 10/05/2017 FINDINGS: Lungs are clear. Heart and mediastinum are within normal limits. Trachea is midline. No significant pleural effusions. Mild degenerative changes in the thoracic spine. Negative for a pneumothorax. Small nodular densities in the lower chest bilaterally and most compatible with nipple shadows. IMPRESSION: No active cardiopulmonary disease. Electronically Signed   By: Markus Daft M.D.   On: 10/17/2017 17:23   Matilde Haymaker, MD 11/08/2017, 4:22 PM PGY-1, [place] Intern pager: [phone], text pages welcome  FPTS Upper-Level Resident Addendum   I have independently interviewed and examined the patient. I have discussed the above with the original author and agree with their  documentation. My edits for correction/addition/clarification are in green. Please see also any attending notes.    Rory Percy, DO PGY-2, [facility] 11/08/2017 11:07 PM  FPTS Service pager: [phone] (text pages welcome through [facility])

## 2017-11-08 NOTE — ED Notes (Signed)
ED Provider at bedside. 

## 2017-11-08 NOTE — ED Triage Notes (Signed)
Pt here from afib clinic with svt rate of 180 , nad noted

## 2017-11-08 NOTE — ED Provider Notes (Signed)
[facility] EMERGENCY DEPARTMENT Provider Note   CSN: [id number] Arrival date & time: 11/08/17  1039     History   Chief Complaint Chief Complaint  Patient presents with  . Tachycardia    HPI [name] is a 77 y.o. male.  Patient known to me.  I saw him on Sunday.  This is patient's fifth visit this month for same complaint.  Patient has a known history of atrial fibrillation and is compliant with taking his diltiazem.  Patient was following up with the atrial fibrillation clinic today.  When he got there his heart rate was 180.  He was appropriately referred here.  Patient when he first arrived here heart rate was also still elevated but with rest sitting down in the gurney heart rate came down into the 130 range.   Patient denies now any chest pain or any shortness of breath.  But he was very fatigued and winded.  Patient's primary care doctor as part of [facility].  Patient's past medical history is significant for proximal atrial fib.  Currently controlled with flecainide and diltiazem.  The patient best I can tell was not been on the flecainide and they wanted him just to start back on his diltiazem.  Patient also is not on Coumadin.  Because he has some GI bleeding problems.  Patient has a chads vascular score of 7.       Past Medical History:  Diagnosis Date  . Anemia    takes Ferrous Sulfate daily  . Arthritis    "all over"  . Balance problem 01/2014  . CAP (community acquired pneumonia) 09/18/2014  . Cervical radiculopathy due to degenerative joint disease of spine   . COPD (chronic obstructive pulmonary disease) ([place])   . Coronary artery disease, non-occlusive    a. 03/2010 Nonocclusive disease by cath, performed for ST elevations on ECG;  b. 06/2013 Lexi MV: EF 60%, no ischemia.  . Diabetes mellitus type II    takes Metformin and Lantus daily  . Diastolic CHF, chronic ([place])    a. 12/2012 EF 55-60%, diast dysfxn, triv MR, mildly dil  LA/RA.  [name] History of blood transfusion 1982   "when I had stomach OR"  . History of bronchitis    1998  . History of gastric ulcer   . HTN (hypertension)    takes Diltiazem daily  . Hyperlipidemia    takes Pravastatin daily  . Joint pain   . PAF (paroxysmal atrial fibrillation) (H[facility])    Recurrent after atrial flutter (a. 07/2010 Status post caval tricuspid isthmus ablation by Dr. Midge Aver Metoprolol daily), currently controlled on flecainide plus diltiazem and Coumadin  . Weakness    numbness and tingling both hands    Patient Active Problem List   Diagnosis Date Noted  . Chest pain with moderate risk of acute coronary syndrome 10/04/2017  . HTN (hypertension) 05/21/2017  . Diabetes mellitus type II 05/21/2017  . Diastolic CHF, chronic ([place]) 05/21/2017  . COPD (chronic obstructive pulmonary disease) ([place]) 05/21/2017  . H/O atrial flutter 05/21/2017  . Coronary disease/nonobstructive 05/21/2017  . Acute blood loss anemia 10/02/2016  . Abdominal pain 10/02/2016  . Constipation 10/02/2016  . Left lower quadrant pain   . Coagulopathy ([place])   . Heme positive stool   . Symptomatic anemia 07/10/2016  . Syncope 05/15/2015  . Cervical disc disease 05/15/2015  . Abnormal urinalysis 05/15/2015  . Anemia 05/15/2015  . History of CVA (cerebrovascular accident) 04/03/2014  . Near syncope  02/15/2014  . Pre-syncope 02/15/2014  . Benign neoplasm of rectum and anal canal 12/02/2013  . Upper GI bleeding 11/30/2013  . Tobacco use disorder 11/30/2013  . Anticoagulation goal of INR 2 to 3, for PAF - CHA2DS2Vasc = 7 08/15/2013  . Type 2 diabetes mellitus ([place]) 01/22/2013  . Tobacco abuse counseling 12/15/2012  . Hyperlipidemia   . Obesity (BMI 30-39.9) 12/14/2012  . Atrial flutter - status post CTI ablation 07/29/2010  . Essential hypertension 07/29/2010  . Chronic diastolic congestive heart failure ([place]) 07/29/2010  . Coronary artery disease, non-occlusive 07/29/2010    Past  Surgical History:  Procedure Laterality Date  . ATRIAL ABLATION SURGERY  08/05/10   CTI ablation for atrial flutter by JA  . CARDIAC CATHETERIZATION  2012   nl LV function, no occlusive CAD, PAF  . CARDIOVERSION  12/07/2010    Successful direct current cardioversion with atrial fibrillation to normal sinus rhythm  . CARPAL TUNNEL RELEASE Bilateral 01/30/2014   Procedure: BILATERAL CARPAL TUNNEL RELEASE;  Surgeon: Marianna Payment, MD;  Location: [facility];  Service: Orthopedics;  Laterality: Bilateral;  . CATARACT EXTRACTION W/ INTRAOCULAR LENS  IMPLANT, BILATERAL Bilateral   . COLONOSCOPY [name] 12/02/2013   Procedure: COLONOSCOPY;  Surgeon: Irene Shipper, MD;  Location: [facility];  Service: Endoscopy;  Laterality: [name];  . ESOPHAGOGASTRODUODENOSCOPY [name] 09/22/2014   Procedure: ESOPHAGOGASTRODUODENOSCOPY (EGD);  Surgeon: Ronald Lobo, MD;  Location: [facility] ENDOSCOPY;  Service: Endoscopy;  Laterality: [name];  . ESOPHAGOGASTRODUODENOSCOPY [name] 07/11/2016   Procedure: ESOPHAGOGASTRODUODENOSCOPY (EGD);  Surgeon: Doran Stabler, MD;  Location: [facility]COPY;  Service: Endoscopy;  Laterality: [name];  . INCISION AND DRAINAGE ABSCESS / HEMATOMA OF BURSA / KNEE / THIGH Left 1998   knee  . KNEE BURSECTOMY Left 1998  . LAPAROSCOPIC CHOLECYSTECTOMY  03/2010  . LEFT HEART CATH AND CORONARY ANGIOGRAPHY [name] 10/05/2017   Procedure: LEFT HEART CATH AND CORONARY ANGIOGRAPHY;  Surgeon: Martinique, Peter M, MD;  Location: [facility];  Service: Cardiovascular;  Laterality: [name];  . NM MYOVIEW LTD  07/22/2013   Normal EF ~60%, no ischemia or infarction.  [name] PARTIAL GASTRECTOMY  1982   subtotal; "took out 30% for ulcers"  . TRANSTHORACIC ECHOCARDIOGRAM  02/16/2014   EF 60%, no RWMA. - otherwise normal  . YAG LASER APPLICATION Bilateral         Home Medications    Prior to Admission medications   Medication Sig Start Date End Date Taking? Authorizing Provider  acetaminophen (TYLENOL) 325 MG tablet Take 325 mg by  mouth every 6 (six) hours as needed for mild pain.   Yes [provider]  albuterol (PROVENTIL HFA;VENTOLIN HFA) 108 (90 Base) MCG/ACT inhaler Inhale 2 puffs into the lungs every 4 (four) hours as needed for wheezing or shortness of breath. 10/30/17  Yes Palumbo, April, MD  aspirin EC 81 MG tablet Take 81 mg by mouth daily.   Yes [provider]  co-enzyme Q-10 30 MG capsule Take 30 mg by mouth daily.    Yes [provider]  diltiazem (CARDIZEM CD) 120 MG 24 hr capsule Take 1 capsule (120 mg total) by mouth daily. 05/04/17  Yes Leonie Man, MD  HYDROcodone-acetaminophen (NORCO/VICODIN) 5-325 MG tablet Take 1 tablet by mouth every 8 (eight) hours as needed for moderate pain.   Yes [provider]  hydrocortisone cream 1 % Apply 1 application topically 2 (two) times daily.  01/02/17  Yes [provider]  Insulin Glargine (LANTUS) 100 UNIT/ML  Solostar Pen Inject 8 Units into the skin 2 (two) times daily. 11/30/16  Yes Domenic Polite, MD  metFORMIN (GLUCOPHAGE) 1000 MG tablet Take 1,000 mg by mouth 2 (two) times daily with a meal.    Yes [provider]  metoprolol succinate (TOPROL-XL) 25 MG 24 hr tablet TAKE 1/2 TABLET  (12.5 MG TOTAL) BY MOUTH DAILY. Patient taking differently: Take 12.5 mg by mouth every evening.  05/04/17  Yes Leonie Man, MD  Multiple Vitamin (MULTIVITAMIN WITH MINERALS) TABS tablet Take 1 tablet by mouth 2 (two) times daily.    Yes [provider]  nitroGLYCERIN (NITROSTAT) 0.4 MG SL tablet Place 1 tablet (0.4 mg total) under the tongue every 5 (five) minutes x 3 doses as needed for chest pain. 11/19/15  Yes Kilroy, Luke K, PA-C  pantoprazole (PROTONIX) 40 MG tablet Take 1 tablet (40 mg total) by mouth 2 (two) times daily before a meal. 11/30/16  Yes Domenic Polite, MD  polyethylene glycol (MIRALAX / GLYCOLAX) packet Take 17 g by mouth 2 (two) times daily. Patient taking differently: Take 17 g by mouth as needed for  moderate constipation.  10/03/16  Yes Sheikh, Omair Latif, DO  pravastatin (PRAVACHOL) 40 MG tablet Take 1 tablet (40 mg total) by mouth every evening. 05/04/17  Yes Leonie Man, MD  tamsulosin (FLOMAX) 0.4 MG CAPS capsule Take 0.4 mg by mouth daily. 02/20/17  Yes [provider]  benzonatate (TESSALON) 100 MG capsule Take 1 capsule (100 mg total) by mouth every 8 (eight) hours. Patient not taking: Reported on 11/08/2017 10/30/17   Randal Buba, April, MD    Family History Family History  Problem Relation Age of Onset  . Cancer Mother   . Heart attack Father   . Heart attack Brother     Social History Social History   Tobacco Use  . Smoking status: Current Every Day Smoker    Packs/day: 0.50    Years: 57.00    Pack years: 28.50    Types: Cigarettes  . Smokeless tobacco: Never Used  . Tobacco comment: He's been smoking between 0.5 and 2 ppd since age 51.  Substance Use Topics  . Alcohol use: No    Alcohol/week: 0.0 standard drinks    Comment: "quit drinking in 1986"  . Drug use: No     Allergies   Cyclobenzaprine   Review of Systems Review of Systems  Constitutional: Positive for fatigue.  HENT: Negative for congestion.   Eyes: Negative for visual disturbance.  Respiratory: Positive for shortness of breath.   Cardiovascular: Positive for palpitations.  Gastrointestinal: Negative for abdominal pain.  Genitourinary: Negative for dysuria.  Musculoskeletal: Negative for back pain.  Neurological: Negative for syncope.  Hematological: Does not bruise/bleed easily.  Psychiatric/Behavioral: Negative for confusion.     Physical Exam Updated Vital Signs BP (!) 92/47   Pulse 91   Temp 98.3 F (36.8 C)   Resp 19   SpO2 100%   Physical Exam  Constitutional: He is oriented to person, place, and time. He appears well-developed and well-nourished. He appears distressed.  HENT:  Head: Normocephalic and atraumatic.  Mouth/Throat: Oropharynx is clear and moist.    Eyes: Pupils are equal, round, and reactive to light. Conjunctivae and EOM are normal.  Neck: Neck supple.  Cardiovascular: Normal heart sounds.  Irregular tachycardic  Pulmonary/Chest: Effort normal and breath sounds normal.  Abdominal: Soft. Bowel sounds are normal. There is no tenderness.  Musculoskeletal: Normal range of motion.  Neurological: He is  alert and oriented to person, place, and time. No cranial nerve deficit. He exhibits normal muscle tone.  Skin: Skin is warm.  Nursing note and vitals reviewed.    ED Treatments / Results  Labs (all labs ordered are listed, but only abnormal results are displayed) Labs Reviewed  CBC - Abnormal; Notable for the following components:      Result Value   RBC 3.62 (*)    Hemoglobin 10.6 (*)    HCT 33.9 (*)    All other components within normal limits  BASIC METABOLIC PANEL - Abnormal; Notable for the following components:   Glucose, Bld 145 (*)    Calcium 8.1 (*)    All other components within normal limits  CBG MONITORING, ED - Abnormal; Notable for the following components:   Glucose-Capillary 142 (*)    All other components within normal limits    EKG EKG Interpretation  Date/Time:  Thursday November 08 2017 12:49:31 EDT Ventricular Rate:  125 PR Interval:    QRS Duration: 82 QT Interval:  336 QTC Calculation: 432 R Axis:   14 Text Interpretation:  Atrial fibrillation Ventricular bigeminy Confirmed by Fredia Sorrow [phone]) on 11/08/2017 4:52:26 PM   Radiology Dg Chest 2 View  Result Date: 11/07/2017 CLINICAL DATA:  Pt c/o palpitations x 1 day. Hx of COPD, CAD, DM, CHF, AND HTN. Pt is a current smoker. EXAM: CHEST - 2 VIEW COMPARISON:  11/04/2017 FINDINGS: Mild hyperinflation. Lower thoracic spondylosis. Numerous leads and wires project over the chest. Midline trachea. Normal heart size. Atherosclerosis in the transverse aorta. No pleural effusion or pneumothorax. Mild interstitial thickening is lower lobe predominant.  No lobar consolidation. No congestive failure. Probable scarring in the left lung base laterally. Surgical clips project over the gastroesophageal junction. IMPRESSION: No acute cardiopulmonary disease. Aortic Atherosclerosis (ICD10-I70.0). Hyperinflation and interstitial thickening, most consistent with COPD/chronic bronchitis. Electronically Signed   By: Abigail Miyamoto M.D.   On: 11/07/2017 19:37    Procedures Procedures (including critical care time) CRITICAL CARE Performed by: Fredia Sorrow Total critical care time: 45 minutes Critical care time was exclusive of separately billable procedures and treating other patients. Critical care was necessary to treat or prevent imminent or life-threatening deterioration. Critical care was time spent personally by me on the following activities: development of treatment plan with patient and/or surrogate as well as nursing, discussions with consultants, evaluation of patient's response to treatment, examination of patient, obtaining history from patient or surrogate, ordering and performing treatments and interventions, ordering and review of laboratory studies, ordering and review of radiographic studies, pulse oximetry and re-evaluation of patient's condition.   .  Medications Ordered in ED Medications  diltiazem (CARDIZEM) 100 mg in dextrose 5% 156mL (1 mg/mL) infusion (0 mg/hr Intravenous Paused 11/08/17 1602)     Initial Impression / Assessment and Plan / ED Course  I have reviewed the triage vital signs and the nursing notes.  Pertinent labs & imaging results that were available during my care of the patient were reviewed by me and considered in my medical decision making (see chart for details).     As stated in the history of present illness patient known to me.  Patient went to the atrial fibrillation clinic that was very tachycardic consistent with atrial fib with RVR.  Was sent here.  Patient heart rate was in the 130s.  Several EKGs  documented.  Is worse his highest heart rate was 173 when he first got here but then with rest it quickly  settled down went down to 1 25-1 30 range.  Patient's blood pressure was fine.  Patient stated he did take his diltiazem this morning.  As stated before this is the patient's fifth visit for this.  Patient clearly does not require admission.  Based on the fact that he had the RVR atrial fib went ahead and gave him diltiazem drip did not give a bolus.  We discussed that at the lowest doses help bring his heart rate down to around 100.  Blood pressures were little borderline sometimes in the 90s sometimes in the upper 80s but patient was never asymptomatic.  Patient's primary care doctor is with [facility].  Patient unassigned medicine admission consultation they will admit the patient.  Cardiology was contacted before and they stated that they would consult and follow they are going to have to help with the management of the atrial fibrillation.        Final Clinical Impressions(s) / ED Diagnoses   Final diagnoses:  Atrial fibrillation with RVR [facility])    ED Discharge Orders    None       Fredia Sorrow, MD 11/08/17 1708

## 2017-11-08 NOTE — ED Notes (Signed)
Dr. Sondra Barges made aware that systolic BP is in the 14J and that cardizem protocol would have [place] pause drip for a systolic below 09K. Dr. Marlow Baars reports that PT is to continue getting 5mg  / hr unless he drops below 80 systolic or becomes symptomatic from hypotension

## 2017-11-08 NOTE — ED Notes (Signed)
Signature pad unavailable at time of pt discharge. Pt verbalized understanding of d/c instructions.Pt denied any further requests.  

## 2017-11-09 DIAGNOSIS — I4891 Unspecified atrial fibrillation: Secondary | ICD-10-CM

## 2017-11-09 LAB — GLUCOSE, CAPILLARY
GLUCOSE-CAPILLARY: 177 mg/dL — AB (ref 70–99)
Glucose-Capillary: 154 mg/dL — ABNORMAL HIGH (ref 70–99)
Glucose-Capillary: 160 mg/dL — ABNORMAL HIGH (ref 70–99)
Glucose-Capillary: 135 mg/dL — ABNORMAL HIGH (ref 70–99)

## 2017-11-09 LAB — CBC
HCT: 35.2 % — ABNORMAL LOW (ref 39.0–52.0)
Hemoglobin: 10.9 g/dL — ABNORMAL LOW (ref 13.0–17.0)
MCH: 29.4 pg (ref 26.0–34.0)
MCHC: 31 g/dL (ref 30.0–36.0)
MCV: 94.9 fL (ref 78.0–100.0)
Platelets: 243 10*3/uL (ref 150–400)
RBC: 3.71 MIL/uL — ABNORMAL LOW (ref 4.22–5.81)
RDW: 15 % (ref 11.5–15.5)
WBC: 7.7 10*3/uL (ref 4.0–10.5)

## 2017-11-09 LAB — LIPID PANEL
CHOL/HDL RATIO: 2.2 ratio
CHOLESTEROL: 82 mg/dL (ref 0–200)
HDL: 38 mg/dL — AB (ref >40)
LDL Cholesterol: 17 mg/dL (ref 0–99)
Triglycerides: 136 mg/dL (ref <150)
VLDL: 27 mg/dL (ref 0–40)

## 2017-11-09 LAB — BASIC METABOLIC PANEL
ANION GAP: 9 (ref 5–15)
BUN: 11 mg/dL (ref 8–23)
CHLORIDE: 106 mmol/L (ref 98–111)
CO2: 24 mmol/L (ref 22–32)
CREATININE: 0.99 mg/dL (ref 0.61–1.24)
Calcium: 7.9 mg/dL — ABNORMAL LOW (ref 8.9–10.3)
GFR calc non Af Amer: >60 mL/min (ref >60)
Glucose, Bld: 158 mg/dL — ABNORMAL HIGH (ref 70–99)
POTASSIUM: 3.9 mmol/L (ref 3.5–5.1)
SODIUM: 139 mmol/L (ref 135–145)

## 2017-11-09 LAB — RAPID URINE DRUG SCREEN, HOSP PERFORMED
AMPHETAMINES: NOT DETECTED
BENZODIAZEPINES: NOT DETECTED
Barbiturates: NOT DETECTED
COCAINE: NOT DETECTED
OPIATES: POSITIVE — AB
Tetrahydrocannabinol: NOT DETECTED

## 2017-11-09 LAB — TROPONIN I: Troponin I: <0.03 ng/mL (ref <0.03)

## 2017-11-09 MED ORDER — DILTIAZEM HCL 30 MG PO TABS
30.0000 mg | ORAL_TABLET | Freq: Four times a day (QID) | ORAL | Status: DC
  Administered 2017-11-09 – 2017-11-10 (×4): 30 mg via ORAL
  Filled 2017-11-09 (×4): qty 1

## 2017-11-09 MED ORDER — SODIUM CHLORIDE 0.9 % IV BOLUS
1000.0000 mL | Freq: Once | INTRAVENOUS | Status: DC
  Administered 2017-11-09: 1000 mL via INTRAVENOUS

## 2017-11-09 MED ORDER — DILTIAZEM HCL ER COATED BEADS 120 MG PO CP24: 120.0000 mg | ORAL_CAPSULE | Freq: Every day | ORAL | Status: DC

## 2017-11-09 NOTE — Progress Notes (Signed)
Manual BP checked 76/43. Received a call from [facility] pt had an R-R 2.7sec paused. MD made aware.

## 2017-11-09 NOTE — Progress Notes (Signed)
Patient complaining he feels weak. V/s check BP 83/43 HR 94. MD paged, awaiting to call back.
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
## 2017-11-09 NOTE — Clinical Social Work Note (Signed)
Clinical Social Work Assessment  Patient Details  Name: [name] MRN: [id number] Date of Birth: [birth year]  Date of referral:  11/09/17               Reason for consult:  Housing Concerns/Homelessness                Permission sought to share information with:    Permission granted to share information::  No  Name::        Agency::     Relationship::     Contact Information:     Housing/Transportation Living arrangements for the past 2 months:  Single Family Home Source of Information:  Patient, Medical Team Patient Interpreter Needed:  None Criminal Activity/Legal Involvement Pertinent to Current Situation/Hospitalization:  No - Comment as needed Significant Relationships:  Adult Children Lives with:  Adult Children Do you feel safe going back to the place where you live?  Yes Need for family participation in patient care:  Yes (Comment)  Care giving concerns:  Homelessness.   Social Worker assessment / plan:  Received call from MD. Patient's daughter has reportedly evicted him on Monday. Patient stated he just needs to find an apartment or room in a boarding house to rent. CSW provided patient with shelter list and encouraged him to call the facilities before he discharges to plan ahead. If they do not have beds, he stated he should be able to stay at his daughter's house until he can figure out alternative plans. CSW also provided patient with list of some low-income apartment options. Discussed potential ALF placement. Patient stated he has not considered it before but would be willing to look into it. CSW provided patient with ALF list. CSW does not typically do ALF placement from hospital due to length of time it takes to complete the process but if patient finds one he is interested in prior to discharge, CSW can help start the process. Patient currently uses taxis to get to doctor's appts, grocery store, etc. Patient said he has Medicaid but after chart review, it appears  he only has Medicare. Will need to reevaluate ALF placement because he will likely be private pay until he can get Medicaid. No further concerns. CSW encouraged patient to contact CSW as needed. CSW will continue to follow patient and assist as needed.  Employment status:  Retired Forensic scientist:  Medicare PT Recommendations:  No Follow Up Information / Referral to community resources:  Shelter, Other (Comment Required)(Low-income apartments, ALF.)  Patient/Family's Response to care:  Patient agreeable to resources. Patient's support system limited. His daughter in [place] just evicted him. He has another daughter but she lives in [place] and he does not want to move there. Patient appreciated social work intervention.  Patient/Family's Understanding of and Emotional Response to Diagnosis, Current Treatment, and Prognosis:  Patient has a good understanding of the reason for admission and social work consult. Patient appears happy with hospital care.  Emotional Assessment Appearance:  Appears stated age Attitude/Demeanor/Rapport:  Engaged, Gracious Affect (typically observed):  Accepting, Appropriate, Calm, Pleasant Orientation:  Oriented to Self, Oriented to Place, Oriented to  Time, Oriented to Situation Alcohol / Substance use:  Tobacco Use Psych involvement (Current and /or in the community):  No (Comment)  Discharge Needs  Concerns to be addressed:  Care Coordination Readmission within the last 30 days:  No Current discharge risk:  Homeless Barriers to Discharge:  Continued Medical Work up   Candie Chroman, LCSW 11/09/2017, 3:57 PM

## 2017-11-09 NOTE — Consult Note (Signed)
CONSULTATION NOTE   Patient Name: [name] Date of Encounter: 11/09/2017 Cardiologist: Glenetta Hew, MD  Chief Complaint   Shortness of breath  Patient Profile   77 year old male patient of Dr. [name] with a history of paroxysmal atrial fibrillation and chronic diastolic congestive heart failure, presented to the A. fib clinic in A. fib with RVR after stopping his flecainide, noted to not be on anticoagulation.  HPI   [name] is a 77 y.o. male who is being seen today for the evaluation of shortness of breath, A. fib with RVR at the request of Dr. Owens Shark.  This is a 77 year old male patient of Dr. [name] with a history of PAF, chronic diastolic heart failure, history of anticoagulation which was discontinued recently due to bleeding and anemia, and previous use of flecainide, discontinued for unknown reasons this past Sunday.  Now presents with shortness of breath, diaphoresis and fatigue to the A. fib clinic and was noted to be in atrial flutter with a rate of 180.  He was sent to the emergency department appropriately due to instability.  He reports the flecainide was causing generalized weakness, confusion and headaches.  He also reports some recent increase in shortness of breath and fatigue.  He denies any chest pain.  Heart rate currently is in the low 100s.  PMHx   Past Medical History:  Diagnosis Date  . Anemia    takes Ferrous Sulfate daily  . Arthritis    "all over"  . Balance problem 01/2014  . CAP (community acquired pneumonia) 09/18/2014  . Cervical radiculopathy due to degenerative joint disease of spine   . COPD (chronic obstructive pulmonary disease) ([place])   . Coronary artery disease, non-occlusive    a. 03/2010 Nonocclusive disease by cath, performed for ST elevations on ECG;  b. 06/2013 Lexi MV: EF 60%, no ischemia.  . Diabetes mellitus type II    takes Metformin and Lantus daily  . Diastolic CHF, chronic ([place])    a. 12/2012 EF 55-60%, diast  dysfxn, triv MR, mildly dil LA/RA.  [name] History of blood transfusion 1982   "when I had stomach OR"  . History of bronchitis    1998  . History of gastric ulcer   . HTN (hypertension)    takes Diltiazem daily  . Hyperlipidemia    takes Pravastatin daily  . Joint pain   . PAF (paroxysmal atrial fibrillation) ([facility])    Recurrent after atrial flutter (a. 07/2010 Status post caval tricuspid isthmus ablation by Dr. Midge Aver Metoprolol daily), currently controlled on flecainide plus diltiazem and Coumadin  . Weakness    numbness and tingling both hands    Past Surgical History:  Procedure Laterality Date  . ATRIAL ABLATION SURGERY  08/05/10   CTI ablation for atrial flutter by JA  . CARDIAC CATHETERIZATION  2012   nl LV function, no occlusive CAD, PAF  . CARDIOVERSION  12/07/2010    Successful direct current cardioversion with atrial fibrillation to normal sinus rhythm  . CARPAL TUNNEL RELEASE Bilateral 01/30/2014   Procedure: BILATERAL CARPAL TUNNEL RELEASE;  Surgeon: Marianna Payment, MD;  Location: [facility];  Service: Orthopedics;  Laterality: Bilateral;  . CATARACT EXTRACTION W/ INTRAOCULAR LENS  IMPLANT, BILATERAL Bilateral   . COLONOSCOPY [name] 12/02/2013   Procedure: COLONOSCOPY;  Surgeon: Irene Shipper, MD;  Location: [facility];  Service: Endoscopy;  Laterality: [name];  . ESOPHAGOGASTRODUODENOSCOPY [name] 09/22/2014   Procedure: ESOPHAGOGASTRODUODENOSCOPY (EGD);  Surgeon: Ronald Lobo, MD;  Location: [facility];  Service:  Endoscopy;  Laterality: [name];  . ESOPHAGOGASTRODUODENOSCOPY [name] 07/11/2016   Procedure: ESOPHAGOGASTRODUODENOSCOPY (EGD);  Surgeon: Doran Stabler, MD;  Location[facility] ENDOSCOPY;  Service: Endoscopy;  Laterality: [name];  . INCISION AND DRAINAGE ABSCESS / HEMATOMA OF BURSA / KNEE / THIGH Left 1998   knee  . KNEE BURSECTOMY Left 1998  . LAPAROSCOPIC CHOLECYSTECTOMY  03/2010  . LEFT HEART CATH AND CORONARY ANGIOGRAPHY [name] 10/05/2017   Procedure: LEFT HEART CATH AND  CORONARY ANGIOGRAPHY;  Surgeon: Martinique, Peter M, MD;  Location: [facility];  Service: Cardiovascular;  Laterality: [name];  . NM MYOVIEW LTD  07/22/2013   Normal EF ~60%, no ischemia or infarction.  [name] PARTIAL GASTRECTOMY  1982   subtotal; "took out 30% for ulcers"  . TRANSTHORACIC ECHOCARDIOGRAM  02/16/2014   EF 60%, no RWMA. - otherwise normal  . YAG LASER APPLICATION Bilateral     FAMHx   Family History  Problem Relation Age of Onset  . Cancer Mother   . Heart attack Father   . Heart attack Brother     SOCHx    reports that he has been smoking cigarettes. He has a 28.50 pack-year smoking history. He has never used smokeless tobacco. He reports that he does not drink alcohol or use drugs.  Outpatient Medications   No current facility-administered medications on file prior to encounter.    Current Outpatient Medications on File Prior to Encounter  Medication Sig Dispense Refill  . acetaminophen (TYLENOL) 325 MG tablet Take 325 mg by mouth every 6 (six) hours as needed for mild pain.    [name] albuterol (PROVENTIL HFA;VENTOLIN HFA) 108 (90 Base) MCG/ACT inhaler Inhale 2 puffs into the lungs every 4 (four) hours as needed for wheezing or shortness of breath. 1 Inhaler 0  . aspirin EC 81 MG tablet Take 81 mg by mouth daily.    [name] co-enzyme Q-10 30 MG capsule Take 30 mg by mouth daily.     [name] diltiazem (CARDIZEM CD) 120 MG 24 hr capsule Take 1 capsule (120 mg total) by mouth daily. 90 capsule 3  . HYDROcodone-acetaminophen (NORCO/VICODIN) 5-325 MG tablet Take 1 tablet by mouth every 8 (eight) hours as needed for moderate pain.    . hydrocortisone cream 1 % Apply 1 application topically 2 (two) times daily.     . metFORMIN (GLUCOPHAGE) 1000 MG tablet Take 1,000 mg by mouth 2 (two) times daily with a meal.     . metoprolol succinate (TOPROL-XL) 25 MG 24 hr tablet TAKE 1/2 TABLET  (12.5 MG TOTAL) BY MOUTH DAILY. (Patient taking differently: Take 12.5 mg by mouth every evening. ) 45 tablet  3  . Multiple Vitamin (MULTIVITAMIN WITH MINERALS) TABS tablet Take 1 tablet by mouth 2 (two) times daily.     . nitroGLYCERIN (NITROSTAT) 0.4 MG SL tablet Place 1 tablet (0.4 mg total) under the tongue every 5 (five) minutes x 3 doses as needed for chest pain. 25 tablet 9  . pantoprazole (PROTONIX) 40 MG tablet Take 1 tablet (40 mg total) by mouth 2 (two) times daily before a meal.    . polyethylene glycol (MIRALAX / GLYCOLAX) packet Take 17 g by mouth 2 (two) times daily. (Patient taking differently: Take 17 g by mouth as needed for moderate constipation. ) 14 each 0  . pravastatin (PRAVACHOL) 40 MG tablet Take 1 tablet (40 mg total) by mouth every evening. 90 tablet 3  . tamsulosin (FLOMAX) 0.4 MG CAPS capsule Take 0.4 mg by mouth  daily.    . benzonatate (TESSALON) 100 MG capsule Take 1 capsule (100 mg total) by mouth every 8 (eight) hours. (Patient not taking: Reported on 11/08/2017) 21 capsule 0    Inpatient Medications    Scheduled Meds: . aspirin EC  81 mg Oral Daily  . diltiazem  30 mg Oral Q6H  . enoxaparin (LOVENOX) injection  40 mg Subcutaneous Q24H  . insulin aspart  0-15 Units Subcutaneous TID WC  . pantoprazole  40 mg Oral BID AC  . pravastatin  40 mg Oral QPM  . tamsulosin  0.4 mg Oral Daily    Continuous Infusions:   PRN Meds: acetaminophen, diclofenac sodium, ondansetron (ZOFRAN) IV, polyethylene glycol   ALLERGIES   Allergies  Allergen Reactions  . Cyclobenzaprine Other (See Comments)    Unsteady gait    ROS   Pertinent items noted in HPI and remainder of comprehensive ROS otherwise negative.  Vitals   Vitals:   11/09/17 1000 11/09/17 1209 11/09/17 1517 11/09/17 1653  BP: (!) 105/52 107/69 107/70 129/72  Pulse:  88 94 90  Resp:  16    Temp:  98.3 F (36.8 C)  98.4 F (36.9 C)  TempSrc:  Oral  Oral  SpO2:  100%  100%  Weight:      Height:        Intake/Output Summary (Last 24 hours) at 11/09/2017 1705 Last data filed at 11/09/2017 1700 Gross per  24 hour  Intake 1018.05 ml  Output 1850 ml  Net -831.95 ml   Filed Weights   11/08/17 1837 11/09/17 0300  Weight: 76.2 kg 76.4 kg    Physical Exam   General appearance: alert and no distress Neck: JVD - 5 cm above sternal notch, no carotid bruit and thyroid not enlarged, symmetric, no tenderness/mass/nodules Lungs: diminished breath sounds bibasilar Heart: irregularly irregular rhythm Abdomen: soft, non-tender; bowel sounds normal; no masses,  no organomegaly Extremities: extremities normal, atraumatic, no cyanosis or edema Pulses: 2+ and symmetric Skin: Pale, cool, dry Neurologic: Mental status: Alert, oriented, thought content appropriate Psych: Pleasant  Labs   Results for orders placed or performed during the hospital encounter of 11/08/17 (from the past 48 hour(s))  CBG monitoring, ED     Status: Abnormal   Collection Time: 11/08/17  1:12 PM  Result Value Ref Range   Glucose-Capillary 142 (H) 70 - 99 mg/dL  CBC     Status: Abnormal   Collection Time: 11/08/17  1:13 PM  Result Value Ref Range   WBC 9.3 4.0 - 10.5 K/uL   RBC 3.62 (L) 4.22 - 5.81 MIL/uL   Hemoglobin 10.6 (L) 13.0 - 17.0 g/dL   HCT 33.9 (L) 39.0 - 52.0 %   MCV 93.6 78.0 - 100.0 fL   MCH 29.3 26.0 - 34.0 pg   MCHC 31.3 30.0 - 36.0 g/dL   RDW 14.9 11.5 - 15.5 %   Platelets 237 150 - 400 K/uL    Comment: Performed at [facility], [place] [address] [place], [place] [address]  Basic metabolic panel     Status: Abnormal   Collection Time: 11/08/17  1:13 PM  Result Value Ref Range   Sodium 138 135 - 145 mmol/L   Potassium 3.9 3.5 - 5.1 mmol/L   Chloride 105 98 - 111 mmol/L   CO2 25 22 - 32 mmol/L   Glucose, Bld 145 (H) 70 - 99 mg/dL   BUN 11 8 - 23 mg/dL   Creatinine, Ser 0.90 0.61 -  1.24 mg/dL   Calcium 8.1 (L) 8.9 - 10.3 mg/dL   GFR calc non Af Amer >60 >60 mL/min   GFR calc Af Amer >60 >60 mL/min    Comment: (NOTE) The eGFR has been calculated using the CKD EPI equation. This calculation  has not been validated in all clinical situations. eGFR's persistently <60 mL/min signify possible Chronic Kidney Disease.    Anion gap 8 5 - 15    Comment: Performed at [facility] [address] [place], [place] [address]  Glucose, capillary     Status: Abnormal   Collection Time: 11/08/17  6:35 PM  Result Value Ref Range   Glucose-Capillary 126 (H) 70 - 99 mg/dL  Troponin I     Status: None   Collection Time: 11/08/17  6:44 PM  Result Value Ref Range   Troponin I <0.03 <0.03 ng/mL    Comment: Performed at [facility] [address], [place], [place] [address]  Hemoglobin A1c     Status: None   Collection Time: 11/08/17  6:44 PM  Result Value Ref Range   Hgb A1c MFr Bld 5.6 4.8 - 5.6 %    Comment: (NOTE) Pre diabetes:          5.7%-6.4% Diabetes:              >6.4% Glycemic control for   <7.0% adults with diabetes    Mean Plasma Glucose 114.02 mg/dL    Comment: Performed at [facility] [address] [place], [place] [address]  TSH     Status: None   Collection Time: 11/08/17  6:44 PM  Result Value Ref Range   TSH 2.693 0.350 - 4.500 uIU/mL    Comment: Performed by a 3rd Generation assay with a functional sensitivity of <=0.01 uIU/mL. Performed at [facility], [place] [address] [place], [place] [address]   Magnesium     Status: None   Collection Time: 11/08/17  6:44 PM  Result Value Ref Range   Magnesium 2.1 1.7 - 2.4 mg/dL    Comment: Performed at [facility], [place] [address] [place], [place] [address]  Protime-INR     Status: None   Collection Time: 11/08/17  6:44 PM  Result Value Ref Range   Prothrombin Time 12.8 11.4 - 15.2 seconds   INR 0.97     Comment: Performed at [facility] [address], [place], [place] [address]  APTT     Status: None   Collection Time: 11/08/17  6:44 PM  Result Value Ref Range   aPTT 31 24 - 36 seconds    Comment: Performed at [facility] [address], [place], [place] [address]    CBC     Status: Abnormal   Collection Time: 11/08/17  6:44 PM  Result Value Ref Range   WBC 8.5 4.0 - 10.5 K/uL   RBC 3.87 (L) 4.22 - 5.81 MIL/uL   Hemoglobin 11.4 (L) 13.0 - 17.0 g/dL   HCT 35.9 (L) 39.0 - 52.0 %   MCV 92.8 78.0 - 100.0 fL   MCH 29.5 26.0 - 34.0 pg   MCHC 31.8 30.0 - 36.0 g/dL   RDW 15.1 11.5 - 15.5 %   Platelets 254 150 - 400 K/uL    Comment: Performed at [facility], [place] [address], [place], [place] [address]  Creatinine, serum     Status: None   Collection Time: 11/08/17  6:44 PM  Result Value Ref Range   Creatinine, Ser 0.87 0.61 - 1.24 mg/dL   GFR calc non Af Amer >60 >60 mL/min   GFR calc Af Amer >60 >60 mL/min    Comment: (NOTE) The eGFR has been calculated using the CKD EPI equation. This calculation has not been validated in all clinical situations. eGFR's persistently <60 mL/min signify possible Chronic Kidney Disease. Performed at [facility], [place] [address], [place], [place] [address]   Brain natriuretic peptide     Status: Abnormal   Collection Time: 11/08/17  9:13 PM  Result Value Ref Range   B Natriuretic Peptide 267.5 (H) 0.0 - 100.0 pg/mL    Comment: Performed at [facility] [address] [place], [place] [address]  Glucose, capillary     Status: Abnormal   Collection Time: 11/08/17  9:39 PM  Result Value Ref Range   Glucose-Capillary 151 (H) 70 - 99 mg/dL   Comment 1 Notify RN    Comment 2 Document in Chart   Troponin I     Status: None   Collection Time: 11/09/17 12:40 AM  Result Value Ref Range   Troponin I <0.03 <0.03 ng/mL    Comment: Performed at [facility], [place] [address] [place], [place] [address]  Lipid panel     Status: Abnormal   Collection Time: 11/09/17 12:40 AM  Result Value Ref Range   Cholesterol 82 0 - 200 mg/dL   Triglycerides 136 <150 mg/dL   HDL 38 (L) >40 mg/dL   Total CHOL/HDL Ratio 2.2 RATIO   VLDL 27 0 - 40 mg/dL   LDL Cholesterol 17 0 - 99 mg/dL    Comment:        Total  Cholesterol/HDL:CHD Risk Coronary Heart Disease Risk Table                     Men   Women  1/2 Average Risk   3.4   3.3  Average Risk       5.0   4.4  2 X Average Risk   9.6   7.1  3 X Average Risk  23.4   11.0        Use the calculated Patient Ratio above and the CHD Risk Table to determine the patient's CHD Risk.        ATP III CLASSIFICATION (LDL):  <100     mg/dL   Optimal  100-129  mg/dL   Near or Above                    Optimal  130-159  mg/dL   Borderline  160-189  mg/dL   High  >190     mg/dL   Very High Performed at [facility] [address], [place], [place] [address]   Troponin I     Status: None   Collection Time: 11/09/17  6:26 AM  Result Value Ref Range   Troponin I <0.03 <0.03 ng/mL    Comment: Performed at [facility] [address] [place] [address]  CBC     Status: Abnormal   Collection Time: 11/09/17  6:26 AM  Result Value Ref Range   WBC 7.7 4.0 - 10.5 K/uL   RBC 3.71 (L) 4.22 - 5.81 MIL/uL   Hemoglobin 10.9 (L) 13.0 - 17.0 g/dL   HCT 35.2 (L) 39.0 - 52.0 %   MCV 94.9 78.0 -  100.0 fL   MCH 29.4 26.0 - 34.0 pg   MCHC 31.0 30.0 - 36.0 g/dL   RDW 15.0 11.5 - 15.5 %   Platelets 243 150 - 400 K/uL    Comment: Performed at [facility], [place] [address] [place], [place] [address]  Basic metabolic panel     Status: Abnormal   Collection Time: 11/09/17  6:26 AM  Result Value Ref Range   Sodium 139 135 - 145 mmol/L   Potassium 3.9 3.5 - 5.1 mmol/L   Chloride 106 98 - 111 mmol/L   CO2 24 22 - 32 mmol/L   Glucose, Bld 158 (H) 70 - 99 mg/dL   BUN 11 8 - 23 mg/dL   Creatinine, Ser 0.99 0.61 - 1.24 mg/dL   Calcium 7.9 (L) 8.9 - 10.3 mg/dL   GFR calc non Af Amer >60 >60 mL/min   GFR calc Af Amer >60 >60 mL/min    Comment: (NOTE) The eGFR has been calculated using the CKD EPI equation. This calculation has not been validated in all clinical situations. eGFR's persistently <60 mL/min signify possible Chronic  Kidney Disease.    Anion gap 9 5 - 15    Comment: Performed at [facility] [address] [place], [place] [address]  Glucose, capillary     Status: Abnormal   Collection Time: 11/09/17  7:53 AM  Result Value Ref Range   Glucose-Capillary 154 (H) 70 - 99 mg/dL  Urine rapid drug screen (hosp performed)     Status: Abnormal   Collection Time: 11/09/17 11:23 AM  Result Value Ref Range   Opiates POSITIVE (A) NONE DETECTED   Cocaine NONE DETECTED NONE DETECTED   Benzodiazepines NONE DETECTED NONE DETECTED   Amphetamines NONE DETECTED NONE DETECTED   Tetrahydrocannabinol NONE DETECTED NONE DETECTED   Barbiturates NONE DETECTED NONE DETECTED    Comment: (NOTE) DRUG SCREEN FOR MEDICAL PURPOSES ONLY.  IF CONFIRMATION IS NEEDED FOR ANY PURPOSE, NOTIFY LAB WITHIN 5 DAYS. LOWEST DETECTABLE LIMITS FOR URINE DRUG SCREEN Drug Class                     Cutoff (ng/mL) Amphetamine and metabolites    1000 Barbiturate and metabolites    200 Benzodiazepine                 157 Tricyclics and metabolites     300 Opiates and metabolites        300 Cocaine and metabolites        300 THC                            50 Performed at [facility], [place] [address], [place] [address]   Glucose, capillary     Status: Abnormal   Collection Time: 11/09/17 11:33 AM  Result Value Ref Range   Glucose-Capillary 160 (H) 70 - 99 mg/dL  Glucose, capillary     Status: Abnormal   Collection Time: 11/09/17  4:10 PM  Result Value Ref Range   Glucose-Capillary 135 (H) 70 - 99 mg/dL    ECG   Atrial flutter at 80 with variable response - Personally Reviewed  Telemetry   Atrial flutter with RVR- Personally Reviewed  Radiology   Dg Chest 2 View  Result Date: 11/07/2017 CLINICAL DATA:  Pt c/o palpitations x 1 day. Hx of COPD, CAD, DM, CHF, AND HTN. Pt is a current  smoker. EXAM: CHEST - 2 VIEW COMPARISON:  11/04/2017 FINDINGS: Mild hyperinflation. Lower thoracic spondylosis. Numerous  leads and wires project over the chest. Midline trachea. Normal heart size. Atherosclerosis in the transverse aorta. No pleural effusion or pneumothorax. Mild interstitial thickening is lower lobe predominant. No lobar consolidation. No congestive failure. Probable scarring in the left lung base laterally. Surgical clips project over the gastroesophageal junction. IMPRESSION: No acute cardiopulmonary disease. Aortic Atherosclerosis (ICD10-I70.0). Hyperinflation and interstitial thickening, most consistent with COPD/chronic bronchitis. Electronically Signed   By: Abigail Miyamoto M.D.   On: 11/07/2017 19:37    Cardiac Studies   LEFT HEART CATH AND CORONARY ANGIOGRAPHY  Conclusion     Dist RCA lesion is 40% stenosed.  Ost 1st Sept lesion is 95% stenosed.  LV end diastolic pressure is normal.   1. Single vessel CAD involving the ostium of the first septal perforator. Otherwise nonobstructive disease. 2. Normal LVEDP  Plan: medical management. No obstructive disease in the RCA. Septal perforator is too small for PCI.  Recommend Aspirin 4m daily for moderate CAD.    Impression   1. Active Problems: 2.   At[place] ([place] 3. Acute on chronic diastolic CHF  Recommendation   1. Mr. PBarbianwas admitted in atrial flutter with rapid ventricular response.  The rate is now better controlled on oral diltiazem.  Since he cannot be anticoagulated due to chronic anemia, I agree with the plan for rate control.  Hopefully we could convert him to long-acting diltiazem tomorrow as his rate control is fairly good and he is asymptomatic at this point.  He appears to be about a liter net negative.  He reports some shortness of breath although does not appear markedly volume overloaded on exam.  He does not appear to have been on a diuretic at home.  Thanks for the consultation.  Cardiology will follow with you.  Time Spent Directly with Patient:  I have spent a total of 45 minutes  with the patient reviewing hospital notes, telemetry, EKGs, labs and examining the patient as well as establishing an assessment and plan that was discussed personally with the patient.  > 50% of time was spent in direct patient care.  Length of Stay:  LOS: 1 day   KPixie Casino MD, FEskenazi Health FLaBelleDirector of the Advanced Lipid Disorders &  Cardiovascular Risk Reduction Clinic Diplomate of the American Board of Clinical Lipidology Attending Cardiologist  Direct Dial: [phone] Fax: [phone] Website:  [email or web address] 11/09/2017, 5:05 PM

## 2017-11-09 NOTE — Evaluation (Signed)
Physical Therapy Evaluation/ Discharge Patient Details Name: [name] MRN: [id number] DOB: 1940-08-24 Today's Date: 11/09/2017   History of Present Illness  77 yo admitted with symptomatic Afib. PMHx: Afib, anemia, arthritis, COPD, CAD,CHF, DM, HTN, HLD  Clinical Impression  PT pleasant and very eager to walk today. Pt reports he cares for himself at home as he lives with daughter who works and isn't home much. Pt states he takes a taxi to get groceries and does limited cooking for himself. He has not been outside walking due to the heat and educated for walking in home, in stores, or early AM before too hot to create a walking program to maintain and maximize current function. Pt at baseline mobility status with HR maintained 115-125 during gait except for 2 spikes to 140. No SoB or difficulty throughout session. Pt with no further acute needs and will sign off with pt in agreement.     Follow Up Recommendations No PT follow up    Equipment Recommendations  None recommended by PT    Recommendations for Other Services       Precautions / Restrictions Precautions Precautions: Fall      Mobility  Bed Mobility Overal bed mobility: Modified Independent                Transfers Overall transfer level: Modified independent                  Ambulation/Gait Ambulation/Gait assistance: Modified independent (Device/Increase time) Gait Distance (Feet): 350 Feet Assistive device: Rolling walker (2 wheeled) Gait Pattern/deviations: Step-through pattern;Decreased stride length;Trunk flexed   Gait velocity interpretation: >2.62 ft/sec, indicative of community ambulatory General Gait Details: pt reports using cane at home but having back pain and educated for RW use recommended to assist with back positioning. Pt walked 350' without need for cues or assist. HR 115-125 during gait with 2 brief spikes to 140 which lasted grossly 3-5 sec each with return to 120 range with  gait  Stairs Stairs: Yes Stairs assistance: Modified independent (Device/Increase time) Stair Management: Two rails;Alternating pattern Number of Stairs: 3    Wheelchair Mobility    Modified Rankin (Stroke Patients Only)       Balance Overall balance assessment: Mild deficits observed, not formally tested                                           Pertinent Vitals/Pain Pain Assessment: 0-10 Pain Score: 4  Pain Location: chronic back pain Pain Descriptors / Indicators: Aching Pain Intervention(s): Limited activity within patient's tolerance;Repositioned;Monitored during session    Home Living Family/patient expects to be discharged to:: Private residence Living Arrangements: Children Available Help at Discharge: Family;Available PRN/intermittently Type of Home: Mobile home Home Access: Stairs to enter Entrance Stairs-Rails: Right;Left;Can reach both Entrance Stairs-Number of Steps: 3 Home Layout: One level Home Equipment: Walker - 4 wheels;Cane - single point;Shower seat      Prior Function Level of Independence: Independent with assistive device(s)               Hand Dominance        Extremity/Trunk Assessment   Upper Extremity Assessment Upper Extremity Assessment: Overall WFL for tasks assessed    Lower Extremity Assessment Lower Extremity Assessment: Overall WFL for tasks assessed    Cervical / Trunk Assessment Cervical / Trunk Assessment: Kyphotic  Communication   Communication: No difficulties  Cognition Arousal/Alertness: Awake/alert Behavior During Therapy: WFL for tasks assessed/performed Overall Cognitive Status: Within Functional Limits for tasks assessed                                 General Comments: pt perseverating on daughter discussing evicting him from home      General Comments General comments (skin integrity, edema, etc.): pt reports no falls in the last 3 years    Exercises      Assessment/Plan    PT Assessment Patent does not need any further PT services  PT Problem List         PT Treatment Interventions      PT Goals (Current goals can be found in the Care Plan section)  Acute Rehab PT Goals PT Goal Formulation: All assessment and education complete, DC therapy    Frequency     Barriers to discharge        Co-evaluation               AM-PAC PT "6 Clicks" Daily Activity  Outcome Measure Difficulty turning over in bed (including adjusting bedclothes, sheets and blankets)?: A Little Difficulty moving from lying on back to sitting on the side of the bed? : A Little Difficulty sitting down on and standing up from a chair with arms (e.g., wheelchair, bedside commode, etc,.)?: A Little Help needed moving to and from a bed to chair (including a wheelchair)?: None Help needed walking in hospital room?: None Help needed climbing 3-5 steps with a railing? : None 6 Click Score: 21    End of Session Equipment Utilized During Treatment: Gait belt Activity Tolerance: Patient tolerated treatment well Patient left: in chair;with call bell/phone within reach;with chair alarm set Nurse Communication: Mobility status PT Visit Diagnosis: Other abnormalities of gait and mobility (R26.89)    Time: 7829-5621 PT Time Calculation (min) (ACUTE ONLY): 19 min   Charges:   PT Evaluation $PT Eval Low Complexity: 1 Low          [address] PT [phone]   [place] 11/09/2017, 8:22 AM

## 2017-11-09 NOTE — Evaluation (Signed)
Occupational Therapy Evaluation Patient Details Name: [name] MRN: [id number] DOB: 03-07-1941 Today's Date: 11/09/2017    History of Present Illness 77 yo admitted with symptomatic Afib. PMHx: Afib, anemia, arthritis, COPD, CAD,CHF, DM, HTN, HLD   Clinical Impression   PTA patient reports independent with ADLs, limited IADLs and mobility.  He is home alone most of the day (and night) he reports, as his daughter works.  He reports having limited activity tolerance and weakness, and difficulty getting in/out of the tub (only bathing 1x/week and not having enough strength to complete basin bath at the sink).  He currently presents with decreased cognition (short term memory and attention), impaired balance, decreased safety awareness, limited activity tolerance and generalized weakness. VSS throughout session. Short blessed test completed (see below).  Based on performance today, recommend continued OT services while admitted in order to address safety, independence and tolerance for ADLs.  May benefit from [facility] services at dc in order to ensure home safety and maximize independence.  Will continue to follow and update recommendations as appropriate.     Follow Up Recommendations  Home health OT;Supervision/Assistance - 24 hour    Equipment Recommendations  None recommended by OT    Recommendations for Other Services       Precautions / Restrictions Precautions Precautions: Fall Restrictions Weight Bearing Restrictions: No      Mobility Bed Mobility Overal bed mobility: Modified Independent             General bed mobility comments: supine to EOB 2x during session no assist  Transfers Overall transfer level: Needs assistance Equipment used: None Transfers: Sit to/from Stand;Stand Pivot Transfers Sit to Stand: Supervision Stand pivot transfers: Supervision       General transfer comment: supervision for safety    Balance Overall balance assessment: Mild deficits  observed, not formally tested                                         ADL either performed or assessed with clinical judgement   ADL Overall ADL's : Needs assistance/impaired Eating/Feeding: Modified independent;Sitting   Grooming: Set up;Sitting   Upper Body Bathing: Set up;Sitting   Lower Body Bathing: Supervison/ safety;Sit to/from stand   Upper Body Dressing : Set up;Sitting   Lower Body Dressing: Supervision/safety;Sit to/from stand   Toilet Transfer: Supervision/safety;Stand-pivot;BSC   Toileting- Water quality scientist and Hygiene: Supervision/safety;Sit to/from stand       Functional mobility during ADLs: Supervision/safety(stand pivot only) General ADL Comments: patient limited by decreased activity tolerance and generalized weakness; cueing to attend to tasks and for safety      Vision Baseline Vision/History: Wears glasses Wears Glasses: Reading only Vision Assessment?: No apparent visual deficits     Perception     Praxis      Pertinent Vitals/Pain Pain Assessment: 0-10 Pain Score: 4  Pain Location: chronic back pain Pain Descriptors / Indicators: Aching Pain Intervention(s): Limited activity within patient's tolerance     Hand Dominance     Extremity/Trunk Assessment Upper Extremity Assessment Upper Extremity Assessment: Generalized weakness   Lower Extremity Assessment Lower Extremity Assessment: Defer to PT evaluation   Cervical / Trunk Assessment Cervical / Trunk Assessment: Kyphotic   Communication Communication Communication: No difficulties   Cognition Arousal/Alertness: Awake/alert Behavior During Therapy: WFL for tasks assessed/performed Overall Cognitive Status: No family/caregiver present to determine baseline cognitive functioning Area of Impairment: Attention;Memory;Safety/judgement  Current Attention Level: Selective Memory: Decreased short-term memory   Safety/Judgement: Decreased  awareness of safety     General Comments: short blessed test completed: scoring 13/28, significant impairment noted in sequencing, attention and short term memory    General Comments  BP increased during session 126/65 seated and 115/56 supine     Exercises     Shoulder Instructions      Home Living Family/patient expects to be discharged to:: Private residence Living Arrangements: Children Available Help at Discharge: Family;Available PRN/intermittently Type of Home: Mobile home Home Access: Stairs to enter Entrance Stairs-Number of Steps: 3 Entrance Stairs-Rails: Right;Left;Can reach both Home Layout: One level     Bathroom Shower/Tub: Teacher, early years/pre: Standard     Home Equipment: Environmental consultant - 4 wheels;Cane - single point;Shower seat          Prior Functioning/Environment Level of Independence: Independent with assistive device(s)        Comments: independent ADLs, limited IADLs, mobility         OT Problem List: Decreased strength;Decreased activity tolerance;Impaired balance (sitting and/or standing);Decreased cognition;Decreased safety awareness;Decreased knowledge of precautions      OT Treatment/Interventions: Self-care/ADL training;Therapeutic exercise;Balance training;Patient/family education;Therapeutic activities    OT Goals(Current goals can be found in the care plan section) Acute Rehab OT Goals Patient Stated Goal: to feel better OT Goal Formulation: With patient Time For Goal Achievement: 11/23/17 Potential to Achieve Goals: Good  OT Frequency: Min 2X/week   Barriers to D/C:            Co-evaluation              AM-PAC PT "6 Clicks" Daily Activity     Outcome Measure Help from another person eating meals?: None Help from another person taking care of personal grooming?: None Help from another person toileting, which includes using toliet, bedpan, or urinal?: A Little Help from another person bathing (including  washing, rinsing, drying)?: None Help from another person to put on and taking off regular upper body clothing?: None Help from another person to put on and taking off regular lower body clothing?: A Little 6 Click Score: 22   End of Session Nurse Communication: Mobility status  Activity Tolerance: Patient tolerated treatment well Patient left: in bed;with bed alarm set;with call bell/phone within reach  OT Visit Diagnosis: Unsteadiness on feet (R26.81);Muscle weakness (generalized) (M62.81)                Time: 8016-5537 OT Time Calculation (min): 26 min Charges:  OT General Charges $OT Visit: 1 Visit OT Evaluation $OT Eval Moderate Complexity: 1 Mod OT Treatments $Self Care/Home Management : 8-22 mins  Delight Stare, OTR/L  Pager [place] 11/09/2017, 12:44 PM

## 2017-11-09 NOTE — Progress Notes (Signed)
Family Medicine Teaching Service Daily Progress Note Intern Pager: [phone]  Patient name: [name] Medical record number: [id number] Date of birth: 1940-12-18 Age: 77 y.o. Gender: male  Primary Care Provider: [name], MD Consultants: Cardiology Code Status: Full (confirmed on admission)  Pt Overview and Major Events to Date:  8/16 - admitted for afib with RVR, started on dilt drip  Assessment and Plan: [name] is a 77 y.o. male presenting to the ED on 8/15 with chest pain. PMH is significant for A-fib(S/P ablation 2012, diltiazem daily), COPD, CAD, diastolic CHF, I6EV, HTN, HLD, anemia.   Paroxysmal Afib/flutter with RVR Now rate controlled s/p diltiazem drip. Drip stopped around midnight due to low BP with SBP to nadir of 77. Patient remained asymptomatic overnight. Reports chest pressure has subsided, likely related to tachycardia as with regular rate early this morning. Most recent HR 120, will restart home PO diltiazem. Will also consult Cardiology to determine optimal PO medication regimen given low BP and currently withholding home BB.  EKG this am with A flutter.  Lipid panel this morning with low LDL, HDL. Trop negative x2. PT/INR wnl. BNP 267.5 on admission though not significantly elevated from 04/2017. TSH and Mag wnl.   - now s/p dilt drip, restart home diltiazem - consult Cardiology this morning. - PT/OT - no PT follow up - continue ASA 81mg  - continue holding home metoprolol    CAD/HFmrEF Denies chest pressure this morning. Trops neg x2. BNP 267.5 on admission though not significantly elevated from 04/2017. Also not fluid overloaded on exam, do not believe he is in exacerbation. UOP -1L overnight despite lack of diuretic use. - monitor I&Os  IDT2DM A1c 5.6% on admission. CBGs stable overnight without use of SSI, 154 this am. Will plan to d/c home Lantus and likely metformin on discharge. - continue SSI - monitor CBGs  COPD Chronic, stable.  Denies SOB this morning. Lung exam with some upper respiratory sounds transmitted but without wheezing. - monitor  Iron Deficiency Anemia Hb remains stable at 10.9, MCV 94.9 this am. Currently asymptomatic without SOB or chest pressure. Has had 4 charted bowel movements since admission, no blood noted. - continue to monitor  Chronic Back Pain Typically takes hydrocodone 5/325 TID PRN at home. Received diclofenac overnight with good relief, is amenable to continuing. - continue diclofenac gel PRN   BPH Stable on tamsulosin. Reports having to urinate frequently last night without dysuria. States this is normal for him on tamsulosin. Condom cath placed overnight for comfort. Can remove during the day. - continue tamsulosin - remove condom cath during the day - monitor I&Os  FEN/GI: regular diet PPx: lovenox  Disposition: continue inpatient management of Afib/flutter  Subjective:  Reports chest pressure has subsided. Not experiencing SOB. Walked with PT this morning with walker and doesn't report significant difficulty.  Objective: Temp:  [97.5 F (36.4 C)-98.3 F (36.8 C)] 97.5 F (36.4 C) (08/16 0432) Pulse Rate:  [44-181] 120 (08/16 0820) Resp:  [18-23] 20 (08/16 0432) BP: (77-136)/(42-77) 98/65 (08/16 0432) SpO2:  [92 %-100 %] 97 % (08/16 0432) Weight:  [76.2 kg-79.8 kg] 76.4 kg (08/16 0300) Physical Exam: General: elderly, chronically ill appearing male, sitting in chair eating breakfast Cardiovascular: irregular rhythm, regular rate. No murmur Respiratory: CTAB with upper airway noises transmitted.  Abdomen: soft, ND. TTP in RUQ (reports chronicity) Extremities: warm and well perfused, no LE edema  Laboratory: Recent Labs  Lab 11/08/17 1313 11/08/17 1844 11/09/17 0626  WBC 9.3 8.5 7.7  HGB 10.6* 11.4* 10.9*  HCT 33.9* 35.9* 35.2*  PLT 237 254 243   Recent Labs  Lab 11/04/17 0859 11/07/17 1849 11/08/17 1313 11/08/17 1844  NA 138 139 138  --   K 3.8 3.9 3.9   --   CL 102 105 105  --   CO2 24 24 25   --   BUN 20 11 11   --   CREATININE 1.17 1.05 0.90 0.87  CALCIUM 8.9 8.4* 8.1*  --   GLUCOSE 236* 66* 145*  --    8/16 EKG - A flutter  Imaging/Diagnostic Tests: No results found.  Rory Percy, DO 11/09/2017, 8:48 AM PGY-2, [place] Intern pager: [phone], text pages welcome

## 2017-11-10 DIAGNOSIS — I4892 Unspecified atrial flutter: Secondary | ICD-10-CM

## 2017-11-10 DIAGNOSIS — I25118 Atherosclerotic heart disease of native coronary artery with other forms of angina pectoris: Secondary | ICD-10-CM

## 2017-11-10 DIAGNOSIS — D649 Anemia, unspecified: Secondary | ICD-10-CM

## 2017-11-10 LAB — GLUCOSE, CAPILLARY
GLUCOSE-CAPILLARY: 170 mg/dL — AB (ref 70–99)
GLUCOSE-CAPILLARY: 122 mg/dL — AB (ref 70–99)

## 2017-11-10 LAB — FERRITIN: Ferritin: 17 ng/mL — ABNORMAL LOW (ref 24–336)

## 2017-11-10 LAB — CBC
HCT: 35.2 % — ABNORMAL LOW (ref 39.0–52.0)
Hemoglobin: 10.9 g/dL — ABNORMAL LOW (ref 13.0–17.0)
MCH: 29.2 pg (ref 26.0–34.0)
MCHC: 31 g/dL (ref 30.0–36.0)
MCV: 94.4 fL (ref 78.0–100.0)
PLATELETS: 244 10*3/uL (ref 150–400)
RBC: 3.73 MIL/uL — AB (ref 4.22–5.81)
RDW: 14.7 % (ref 11.5–15.5)
WBC: 8.4 10*3/uL (ref 4.0–10.5)

## 2017-11-10 MED ORDER — POLYETHYLENE GLYCOL 3350 17 G PO PACK: 17.0000 g | PACK | ORAL | 0 refills | Status: DC | PRN

## 2017-11-10 MED ORDER — DICLOFENAC SODIUM 1 % TD GEL: 4.0000 g | Freq: Three times a day (TID) | TRANSDERMAL | 0 refills | Status: DC | PRN

## 2017-11-10 MED ORDER — DILTIAZEM HCL ER COATED BEADS 120 MG PO CP24
120.0000 mg | ORAL_CAPSULE | Freq: Every day | ORAL | Status: DC
  Administered 2017-11-10: 120 mg via ORAL
  Filled 2017-11-10: qty 1

## 2017-11-10 NOTE — Care Management Note (Signed)
Case Management Note  Patient Details  Name: [name] MRN: [id number] Date of Birth: 1940-09-17  Subjective/Objective:           Pt from home with daughter.  Pt plans to return to daughter's home at [address], [place].  He states she just got "hyper" when she said he couldn't live there and she will come pick him up today.  He uses a walker at home but does not have a 3n1 and would like one.  Pt has used [facility] in the past and would like to use again.       Action/Plan: Tommi Rumps with [facility] given referral for PT, OT, RN.   [facility] to deliver 3n1 to room prior to pt's d/c.     Expected Discharge Date:  11/10/17               Expected Discharge Plan:  [place]  In-House Referral:  Clinical Social Work  Discharge planning Services  CM Consult  Post Acute Care Choice:  Home Health Choice offered to:  Patient  DME Arranged:  3-N-1 DME Agency:  [place]:  PT, OT, RN [place] Agency:  [place]  Status of Service:  Completed, signed off  If discussed at [place] of Stay Meetings, dates discussed:    Additional Comments:  Claudie Leach, RN 11/10/2017, 1:52 PM

## 2017-11-10 NOTE — Progress Notes (Signed)
Progress Note  Patient Name: [name] Date of Encounter: 11/10/2017  Primary Cardiologist: Glenetta Hew, MD   Subjective   Denies chest pain and shortness of breath.  Feels slightly fatigued.  Inpatient Medications    Scheduled Meds: . aspirin EC  81 mg Oral Daily  . diltiazem  30 mg Oral Q6H  . enoxaparin (LOVENOX) injection  40 mg Subcutaneous Q24H  . insulin aspart  0-15 Units Subcutaneous TID WC  . pantoprazole  40 mg Oral BID AC  . pravastatin  40 mg Oral QPM  . tamsulosin  0.4 mg Oral Daily   Continuous Infusions:  PRN Meds: acetaminophen, diclofenac sodium, ondansetron (ZOFRAN) IV, polyethylene glycol   Vital Signs    Vitals:   11/09/17 2023 11/09/17 2319 11/10/17 0211 11/10/17 0504  BP: (!) 95/44 125/68  105/63  Pulse: 89   91  Resp: 17   17  Temp: 98 F (36.7 C)   (!) 97.4 F (36.3 C)  TempSrc: Oral   Oral  SpO2: 97%   96%  Weight:   75.9 kg   Height:        Intake/Output Summary (Last 24 hours) at 11/10/2017 1017 Last data filed at 11/10/2017 0920 Gross per 24 hour  Intake 1239 ml  Output 1500 ml  Net -261 ml   Filed Weights   11/08/17 1837 11/09/17 0300 11/10/17 0211  Weight: 76.2 kg 76.4 kg 75.9 kg    Telemetry    Rapid atrial flutter, atrial fibrillation, currently in sinus rhythm- Personally Reviewed  ECG    No new tracings- Personally Reviewed  Physical Exam   GEN: No acute distress.   Neck: No JVD Cardiac: RRR, no murmurs, rubs, or gallops.  Respiratory: Clear to auscultation bilaterally. GI: Soft, nontender, non-distended  MS: No edema; No deformity. Neuro:  Nonfocal  Psych: Normal affect   Labs    Chemistry Recent Labs  Lab 11/07/17 1849 11/08/17 1313 11/08/17 1844 11/09/17 0626  NA 139 138  --  139  K 3.9 3.9  --  3.9  CL 105 105  --  106  CO2 24 25  --  24  GLUCOSE 66* 145*  --  158*  BUN 11 11  --  11  CREATININE 1.05 0.90 0.87 0.99  CALCIUM 8.4* 8.1*  --  7.9*  GFRNONAA >60 >60 >60 >60  GFRAA  >60 >60 >60 >60  ANIONGAP 10 8  --  9     Hematology Recent Labs  Lab 11/08/17 1844 11/09/17 0626 11/10/17 0444  WBC 8.5 7.7 8.4  RBC 3.87* 3.71* 3.73*  HGB 11.4* 10.9* 10.9*  HCT 35.9* 35.2* 35.2*  MCV 92.8 94.9 94.4  MCH 29.5 29.4 29.2  MCHC 31.8 31.0 31.0  RDW 15.1 15.0 14.7  PLT 254 243 244    Cardiac Enzymes Recent Labs  Lab 11/08/17 1844 11/09/17 0040 11/09/17 0626  TROPONINI <0.03 <0.03 <0.03    Recent Labs  Lab 11/04/17 0304 11/04/17 0902 11/07/17 1852  TROPIPOC 0.02 0.00 0.01     BNP Recent Labs  Lab 11/08/17 2113  BNP 267.5*     DDimer No results for input(s): DDIMER in the last 168 hours.   Radiology    No results found.  Cardiac Studies   Cath 10/05/17:  LEFT HEART CATH AND CORONARY ANGIOGRAPHY  Conclusion     Dist RCA lesion is 40% stenosed.  Ost 1st Sept lesion is 95% stenosed.  LV end diastolic pressure is normal.  1. Single  vessel CAD involving the ostium of the first septal perforator. Otherwise nonobstructive disease. 2. Normal LVEDP  Plan: medical management. No obstructive disease in the RCA. Septal perforator is too small for PCI.  Recommend Aspirin 81mg  daily for moderate CAD.   Echo 10/05/17:  Study Conclusions  - Left ventricle: The cavity size was normal. Wall thickness was   increased in a pattern of mild LVH. Systolic function was normal.   The estimated ejection fraction was in the range of 55% to 60%. - Aortic valve: There was trivial regurgitation. - Mitral valve: There was mild regurgitation. - Left atrium: The atrium was mildly dilated.  Patient Profile     77 y.o. male with a history of paroxysmal atrial fibrillation and chronic diastolic congestive heart failure, presented to the A. fib clinic in A. fib with RVR after stopping his flecainide, noted to not be on anticoagulation.  Assessment & Plan    1.  Rapid atrial fibrillation and flutter: He is still having paroxysms of rapid atrial  tachyarrhythmias but is currently in sinus rhythm for the time being.  He is on short acting diltiazem.  I will switch to long-acting diltiazem 120 mg daily.  He also takes low-dose Toprol-XL at home.  He is not on anticoagulation due to chronic anemia.  Blood pressure is low normal today.  2.  Coronary disease: Single-vessel small vessel disease for which medical management was recommended.  Currently on aspirin and statin.  3.  Chronic anemia: Hemoglobin is 10.9 today.   For questions or updates, please contact [place] Please consult [email or web address] for contact info under Cardiology/STEMI.      Signed, Kate Sable, MD  11/10/2017, 10:17 AM

## 2017-11-10 NOTE — Discharge Summary (Addendum)
[facility] Discharge Summary  Patient name: [name] Medical record number: [id number] Date of birth: [birth year] Age: 77 y.o. Gender: male Date of Admission: 11/08/2017  Date of Discharge: 11/10/2017 Admitting Physician: Martyn Malay, MD  Primary Care Provider: [name], MD Consultants: Cardiology  Indication for Hospitalization: Atrial fibrillation with RVR  Discharge Diagnoses/Problem List:  Paroxysmal Afib/flutter with RVR, improved CAD, stable HFmrEF, stable IDT2DM, stable History of Gastrointestinal Bleeding CHADS2VASC =4, not on anticoagulation due to GI bleeding  COPD, stable Iron Deficiency Anemia, stable Chronic Back Pain, stable BPH  Disposition: Home with home health  Discharge Condition: Improved  Discharge Exam:  General: elderly male lying in bed, comfortable appearing. Cardiovascular: RRR, no murmur Respiratory: CTAB, no wheezing/rales.  Abdomen: soft, NTND. +BS Extremities: warm and well perfused, no LE edema  Brief Hospital Course:  Trevor Powellis a 77 y.o.malewith PMH significant for A-fib/flutter (S/P ablation2012), COPD, CAD, diastolic CHF, W4OX, HTN, HLD, anemia who presented withchest pain, found to be in Afib with RVR. He was started on a diltiazem drip in the ED with adequate response in heart rate. He was then transitioned to his home dose of PO diltiazem which he tolerated well. Of note, patient is not on anticoagulation due to history of multiple GI bleeding events requiring admissions and numerous transfusions. Cardiology consulted this admission who agreed with management and will follow outpatient. It was thought that the inciting event was most likely due to missed doses of medication as he has been seen in the ED multiple times in the last week with ongoing complex social situation.  Patient ambulated with appropriate heart rate response (rising to 106 bpm) prior to discharge. He was  discharged on Diltiazem and Metoprolol as below. He is not on flecainide at this time.   He lives with his daughter and reports she typically manages his medication although states he was recently evicted from her home. He denied physical threats. Social work was consulted this admission who provided patient with list of ALF and shelter facilities. Prior to discharge, patient adamant about returning to his daughter's home stating she had called and they "worked it out." Patient medically stable for discharge with home health PT, OT, and RN for medication management with [facility] referral.   Issues for Follow Up:  1. Medication Changes: 1. Continued home doses of diltiazem and metoprolol 2. A1c 5.6. Discontinued home Lantus due to risk for hypoglycemia, minimal sliding scale use during admission. Continued on metformin. 3. Discontinued home PRN hydrocodone. Back pain responded well to PRN diclofenac gel, script provided on discharge. 2. Patient will have Home Health PT, OT, RN for medication management. 3. Referred for [facility] Care Management. 4. Will follow up with Cardiology outpatient. 5. Ferritin low at 17. Recommend continued follow up with Heme/Onc for regular iron infusions.  Significant Procedures: None  Significant Labs and Imaging:  Recent Labs  Lab 11/08/17 1844 11/09/17 0626 11/10/17 0444  WBC 8.5 7.7 8.4  HGB 11.4* 10.9* 10.9*  HCT 35.9* 35.2* 35.2*  PLT 254 243 244   Recent Labs  Lab 11/04/17 0257 11/04/17 0859 11/07/17 1849 11/08/17 1313 11/08/17 1844 11/09/17 0626  NA 136 138 139 138  --  139  K 4.7 3.8 3.9 3.9  --  3.9  CL 99 102 105 105  --  106  CO2 25 24 24 25   --  24  GLUCOSE 189* 236* 66* 145*  --  158*  BUN 23 20 11 11   --  11  CREATININE 1.06 1.17 1.05 0.90 0.87 0.99  CALCIUM 8.8* 8.9 8.4* 8.1*  --  7.9*  MG  --   --   --   --  2.1  --    Results/Tests Pending at Time of Discharge: None  Discharge Medications:  Allergies as of 11/10/2017      Reactions    Cyclobenzaprine Other (See Comments)   Unsteady gait      Medication List    STOP taking these medications   benzonatate 100 MG capsule Commonly known as:  TESSALON   HYDROcodone-acetaminophen 5-325 MG tablet Commonly known as:  NORCO/VICODIN     TAKE these medications   acetaminophen 325 MG tablet Commonly known as:  TYLENOL Take 325 mg by mouth every 6 (six) hours as needed for mild pain.   albuterol 108 (90 Base) MCG/ACT inhaler Commonly known as:  PROVENTIL HFA;VENTOLIN HFA Inhale 2 puffs into the lungs every 4 (four) hours as needed for wheezing or shortness of breath.   aspirin EC 81 MG tablet Take 81 mg by mouth daily.   co-enzyme Q-10 30 MG capsule Take 30 mg by mouth daily.   diclofenac sodium 1 % Gel Commonly known as:  VOLTAREN Apply 4 g topically 3 (three) times daily as needed (for back pain).   diltiazem 120 MG 24 hr capsule Commonly known as:  CARDIZEM CD Take 1 capsule (120 mg total) by mouth daily.   hydrocortisone cream 1 % Apply 1 application topically 2 (two) times daily.   metFORMIN 1000 MG tablet Commonly known as:  GLUCOPHAGE Take 1,000 mg by mouth 2 (two) times daily with a meal.   metoprolol succinate 25 MG 24 hr tablet Commonly known as:  TOPROL-XL TAKE 1/2 TABLET  (12.5 MG TOTAL) BY MOUTH DAILY. What changed:    how much to take  how to take this  when to take this  additional instructions   multivitamin with minerals Tabs tablet Take 1 tablet by mouth 2 (two) times daily.   nitroGLYCERIN 0.4 MG SL tablet Commonly known as:  NITROSTAT Place 1 tablet (0.4 mg total) under the tongue every 5 (five) minutes x 3 doses as needed for chest pain.   pantoprazole 40 MG tablet Commonly known as:  PROTONIX Take 1 tablet (40 mg total) by mouth 2 (two) times daily before a meal.   polyethylene glycol packet Commonly known as:  MIRALAX / GLYCOLAX Take 17 g by mouth as needed for moderate constipation.   pravastatin 40 MG  tablet Commonly known as:  PRAVACHOL Take 1 tablet (40 mg total) by mouth every evening.   tamsulosin 0.4 MG Caps capsule Commonly known as:  FLOMAX Take 0.4 mg by mouth daily.            Durable Medical Equipment  (From admission, onward)         Start     Ordered   11/10/17 1345  For home use only DME 3 n 1  Once     11/10/17 1347          Discharge Instructions: Please refer to Patient Instructions section of EMR for full details.  Patient was counseled important signs and symptoms that should prompt return to medical care, changes in medications, dietary instructions, activity restrictions, and follow up appointments.   Follow-Up Appointments: Follow-up Information    [name], MD. Schedule an appointment as soon as possible for a visit.   Specialty:  Family Medicine Contact information: [place]  [place] [address] [phone]        Leonie Man, MD .   Specialty:  Cardiology Contact information: [address] [place] [address] [phone]           Rory Percy, DO 11/10/2017, 1:56 PM PGY-2, [place] PAGER [phone] for any questions or notifications regarding this patient   FMTS Attending Daily Note: Dorris Singh, MD  Pager [phone]  Office [phone]   I have seen and examined this patient, reviewed their chart. I have discussed this patient with the resident. I agree with the resident's findings, assessment and care plan.  Dorris Singh, MD  Family Medicine Teaching Service

## 2017-11-10 NOTE — Progress Notes (Signed)
Family Medicine Teaching Service Daily Progress Note Intern Pager: [phone]  Patient name: Collen Vincent Medical record number: [id number] Date of birth: May 08, 1940 Age: 77 y.o. Gender: male  Primary Care Provider: [name], MD Consultants: Cardiology Code Status: Full (confirmed on admission)  Pt Overview and Major Events to Date:  8/16 - admitted for afib with RVR, started on dilt drip  Assessment and Plan: Ashish Rossetti is a 77 y.o. male presenting to the ED on 8/15 with chest pain. PMH is significant for A-fib(S/P ablation 2012, diltiazem daily), COPD, CAD, diastolic CHF, S5KN, HTN, HLD, anemia.   Paroxysmal Afib/flutter with RVR Continues to be rate controlled on PO diltiazem at rest (80-90s) since yesterday however HR spikes up to 180s during activity per nursing. BP stable. Rhythm appears to be in sinus rhythm on exam and telemetry, will obtain follow up EKG. Cardiology consulted for medication adjustment due to lower BP on lower dose of diltiazem and holding home metoprolol, appreciate recommendations. Denies chest pain, SOB this morning.   - now s/p dilt drip, continue PO diltiazem. - Cardiology following, appreciate recs - PT/OT - no PT follow up, HHOT - continue ASA 81mg  - continue holding home metoprolol   - obtain EKG  CAD/HFmrEF Denies chest pressure this morning, no evidence of fluid overload on exam. UOP -1.7L overnight despite lack of diuretic use. - monitor I&Os  IDT2DM A1c 5.6% on admission. CBGs stable overnight, 8u of aspart in the last 24 hrs. CBG 170 this am. Will plan to d/c home Lantus. Given use of aspart yesterday, can likely continue metformin on discharge. - continue SSI - monitor CBGs  COPD Chronic, stable. Denies SOB this morning. Lung exam clear this morning. - monitor  Iron Deficiency Anemia Hb remains stable at 10.9, MCV 94.4 this am. Ferritin 17. Currently asymptomatic without SOB or chest pressure. No bowel movements documented  overnight. - continue to monitor  Chronic Back Pain Typically takes hydrocodone 5/325 TID PRN at home. No complaints this morning. - continue diclofenac gel PRN   BPH Stable on tamsulosin. Still urinating frequently, appears to be his baseline. No difficulties getting to bathroom to urinate, will continue to withhold condom cath.  - continue tamsulosin - monitor I&Os  FEN/GI: regular diet PPx: lovenox  Disposition: continue inpatient management of Afib/flutter  Subjective:  Denies chest pain, SOB. Reports higher HR during activity and feeling "his head jump around" when this happens. Reports he spoke to his daughter and "worked everything out, she calmed down." Feels ok about going home to live with daughter. Is happy about having home health come to the house.  Objective: Temp:  [97.4 F (36.3 C)-98.4 F (36.9 C)] 97.4 F (36.3 C) (08/17 0504) Pulse Rate:  [88-120] 91 (08/17 0504) Resp:  [16-17] 17 (08/17 0504) BP: (76-129)/(43-72) 105/63 (08/17 0504) SpO2:  [96 %-100 %] 96 % (08/17 0504) Weight:  [75.9 kg] 75.9 kg (08/17 0211) Physical Exam: General: elderly male lying in bed, comfortable appearing. Cardiovascular: RRR, no murmur Respiratory: CTAB, no wheezing/rales.  Abdomen: soft, NTND. +BS Extremities: warm and well perfused, no LE edema  Laboratory: Recent Labs  Lab 11/08/17 1844 11/09/17 0626 11/10/17 0444  WBC 8.5 7.7 8.4  HGB 11.4* 10.9* 10.9*  HCT 35.9* 35.2* 35.2*  PLT 254 243 244   Recent Labs  Lab 11/07/17 1849 11/08/17 1313 11/08/17 1844 11/09/17 0626  NA 139 138  --  139  K 3.9 3.9  --  3.9  CL 105 105  --  106  CO2 24 25  --  24  BUN 11 11  --  11  CREATININE 1.05 0.90 0.87 0.99  CALCIUM 8.4* 8.1*  --  7.9*  GLUCOSE 66* 145*  --  158*   8/16 EKG - A flutter  Imaging/Diagnostic Tests: No results found.  Rory Percy, DO 11/10/2017, 7:02 AM PGY-2, [place] Intern pager: [phone], text pages welcome

## 2017-11-10 NOTE — Progress Notes (Signed)
Patient left before they brought his bedside commode, he said he was rushing and cant wait.

## 2017-11-10 NOTE — Progress Notes (Signed)
Occupational Therapy Treatment Patient Details Name: [name] MRN: [id number] DOB: [birth year] Today's Date: 11/10/2017    History of present illness 77 yo admitted with symptomatic Afib. PMHx: Afib, anemia, arthritis, COPD, CAD,CHF, DM, HTN, HLD   OT comments  Pt reports feeling much better today and he is demonstrating progress toward OT goals today. He was able to complete simulated toilet transfers and standing grooming tasks with supervision today. He does require consistent redirection to attend to instructions and education concerning energy conservation strategies. Recommended that pt does not attempt shower transfers until assisted by home health OT and he verbalizes understanding. Pt with HR spiking to 145 on one occasion and otherwise ranging from 116-135 during functional mobility in hallway. Pt would benefit from continued OT services while admitted and continue to recommend home health OT follow-up to maximize safety and understanding of precautions post-acute D/C.    Follow Up Recommendations  Home health OT;Supervision/Assistance - 24 hour    Equipment Recommendations  None recommended by OT    Recommendations for Other Services      Precautions / Restrictions Precautions Precautions: Fall Restrictions Weight Bearing Restrictions: No       Mobility Bed Mobility Overal bed mobility: Modified Independent             General bed mobility comments: No assistance required.   Transfers Overall transfer level: Needs assistance Equipment used: None Transfers: Sit to/from [facility] Sit to Stand: Supervision Stand pivot transfers: Supervision       General transfer comment: Supervision for safety.    Balance Overall balance assessment: Mild deficits observed, not formally tested                                         ADL either performed or assessed with clinical judgement   ADL Overall ADL's : Needs  assistance/impaired     Grooming: Supervision/safety;Standing Grooming Details (indicate cue type and reason): cues to adhere to energy conservation strategies.          Upper Body Dressing : Set up;Sitting   Lower Body Dressing: Supervision/safety;Sit to/from stand   Toilet Transfer: Supervision/safety;Ambulation;RW Toilet Transfer Details (indicate cue type and reason): simulated with sit<>stand followed by functional mobility in hallway and room         Functional mobility during ADLs: Supervision/safety General ADL Comments: Educated pt concerning energy conservation strategies and recommendation to not complete tub transfers without either home health or daughter's assistance. He reports he will be discharging home with daughter and son-in-law.      Vision   Vision Assessment?: Vision impaired- to be further tested in functional context Eye Alignment: Impaired (comment)(laterally rotated) Alignment/Gaze Preference: Other (comment)(eyes bilaterally laterally rotated) Additional Comments: Pt reports that he is legally blind in his L eye. Noted to run into items on the L throghout session.    Perception     Praxis      Cognition Arousal/Alertness: Awake/alert Behavior During Therapy: WFL for tasks assessed/performed Overall Cognitive Status: No family/caregiver present to determine baseline cognitive functioning Area of Impairment: Attention;Memory;Safety/judgement                   Current Attention Level: Selective Memory: Decreased short-term memory   Safety/Judgement: Decreased awareness of safety     General Comments: Pt with notable decreased attention and awareness. He requires consistent redirection to attend to all education topics.  Exercises     Shoulder Instructions       General Comments Provided pt with Sprite at end of session.     Pertinent Vitals/ Pain       Pain Assessment: Faces Faces Pain Scale: Hurts a little bit Pain  Location: chronic back pain Pain Descriptors / Indicators: Aching Pain Intervention(s): Limited activity within patient's tolerance;Monitored during session;Repositioned  Home Living                                          Prior Functioning/Environment              Frequency  Min 2X/week        Progress Toward Goals  OT Goals(current goals can now be found in the care plan section)  Progress towards OT goals: Progressing toward goals  Acute Rehab OT Goals Patient Stated Goal: to get stronger and have his heart problems figured out OT Goal Formulation: With patient Time For Goal Achievement: 11/23/17 Potential to Achieve Goals: Good  Plan Discharge plan remains appropriate    Co-evaluation                 AM-PAC PT "6 Clicks" Daily Activity     Outcome Measure   Help from another person eating meals?: None Help from another person taking care of personal grooming?: None Help from another person toileting, which includes using toliet, bedpan, or urinal?: None Help from another person bathing (including washing, rinsing, drying)?: None Help from another person to put on and taking off regular upper body clothing?: None Help from another person to put on and taking off regular lower body clothing?: None 6 Click Score: 24    End of Session Equipment Utilized During Treatment: Gait belt;Rolling walker  OT Visit Diagnosis: Unsteadiness on feet (R26.81);Muscle weakness (generalized) (M62.81)   Activity Tolerance Patient tolerated treatment well   Patient Left in bed;with bed alarm set;with call bell/phone within reach   Nurse Communication Mobility status        Time: 1340-1405 OT Time Calculation (min): 25 min  Charges: OT General Charges $OT Visit: 1 Visit OT Treatments $Self Care/Home Management : 23-37 mins  Norman Herrlich, MS OTR/L  Pager: [place] [facility] [place] 11/10/2017, 2:34 PM

## 2017-11-10 NOTE — Discharge Instructions (Signed)
You were admitted for chest pain due to atrial fibrillation with a high heart rate. You were given medications to help bring your heart rate down which helped your chest pain. You were monitored on your new dose and were stable prior to discharge. You will have home health provide physical therapy and medication management.

## 2017-11-10 NOTE — Progress Notes (Signed)
Discharge instructions given to patient, all question answered, family medicine paged about sending his prescription to [facility].

## 2017-11-12 ENCOUNTER — Emergency Department (HOSPITAL_COMMUNITY): Payer: Medicare Other

## 2017-11-12 ENCOUNTER — Inpatient Hospital Stay (HOSPITAL_COMMUNITY)
Admission: EM | Admit: 2017-11-12 | Discharge: 2017-11-16 | DRG: 309 | Disposition: A | Discharge status: Home / Self Care | Payer: Medicare Other | Attending: Family Medicine | Admitting: Family Medicine

## 2017-11-12 ENCOUNTER — Encounter (HOSPITAL_COMMUNITY): Payer: Self-pay | Admitting: Emergency Medicine

## 2017-11-12 DIAGNOSIS — Z8249 Family history of ischemic heart disease and other diseases of the circulatory system: Secondary | ICD-10-CM | POA: Diagnosis not present

## 2017-11-12 DIAGNOSIS — Z79899 Other long term (current) drug therapy: Secondary | ICD-10-CM

## 2017-11-12 DIAGNOSIS — Z7984 Long term (current) use of oral hypoglycemic drugs: Secondary | ICD-10-CM | POA: Diagnosis not present

## 2017-11-12 DIAGNOSIS — G4700 Insomnia, unspecified: Secondary | ICD-10-CM | POA: Diagnosis not present

## 2017-11-12 DIAGNOSIS — I4892 Unspecified atrial flutter: Secondary | ICD-10-CM | POA: Diagnosis present

## 2017-11-12 DIAGNOSIS — Z7982 Long term (current) use of aspirin: Secondary | ICD-10-CM

## 2017-11-12 DIAGNOSIS — Z961 Presence of intraocular lens: Secondary | ICD-10-CM | POA: Diagnosis present

## 2017-11-12 DIAGNOSIS — I4891 Unspecified atrial fibrillation: Secondary | ICD-10-CM | POA: Diagnosis present

## 2017-11-12 DIAGNOSIS — R002 Palpitations: Secondary | ICD-10-CM | POA: Diagnosis not present

## 2017-11-12 DIAGNOSIS — I491 Atrial premature depolarization: Secondary | ICD-10-CM | POA: Diagnosis not present

## 2017-11-12 DIAGNOSIS — Z9049 Acquired absence of other specified parts of digestive tract: Secondary | ICD-10-CM

## 2017-11-12 DIAGNOSIS — F17210 Nicotine dependence, cigarettes, uncomplicated: Secondary | ICD-10-CM | POA: Diagnosis present

## 2017-11-12 DIAGNOSIS — Z888 Allergy status to other drugs, medicaments and biological substances status: Secondary | ICD-10-CM

## 2017-11-12 DIAGNOSIS — D509 Iron deficiency anemia, unspecified: Secondary | ICD-10-CM | POA: Diagnosis present

## 2017-11-12 DIAGNOSIS — I5032 Chronic diastolic (congestive) heart failure: Secondary | ICD-10-CM | POA: Diagnosis present

## 2017-11-12 DIAGNOSIS — G8929 Other chronic pain: Secondary | ICD-10-CM | POA: Diagnosis present

## 2017-11-12 DIAGNOSIS — Z6826 Body mass index (BMI) 26.0-26.9, adult: Secondary | ICD-10-CM | POA: Diagnosis not present

## 2017-11-12 DIAGNOSIS — M545 Low back pain: Secondary | ICD-10-CM | POA: Diagnosis present

## 2017-11-12 DIAGNOSIS — E785 Hyperlipidemia, unspecified: Secondary | ICD-10-CM

## 2017-11-12 DIAGNOSIS — I481 Persistent atrial fibrillation: Secondary | ICD-10-CM | POA: Diagnosis present

## 2017-11-12 DIAGNOSIS — I110 Hypertensive heart disease with heart failure: Secondary | ICD-10-CM | POA: Diagnosis present

## 2017-11-12 DIAGNOSIS — I455 Other specified heart block: Secondary | ICD-10-CM | POA: Diagnosis not present

## 2017-11-12 DIAGNOSIS — I10 Essential (primary) hypertension: Secondary | ICD-10-CM | POA: Diagnosis not present

## 2017-11-12 DIAGNOSIS — R0602 Shortness of breath: Secondary | ICD-10-CM | POA: Diagnosis not present

## 2017-11-12 DIAGNOSIS — Z8711 Personal history of peptic ulcer disease: Secondary | ICD-10-CM

## 2017-11-12 DIAGNOSIS — R000 Tachycardia, unspecified: Secondary | ICD-10-CM | POA: Diagnosis not present

## 2017-11-12 DIAGNOSIS — E118 Type 2 diabetes mellitus with unspecified complications: Secondary | ICD-10-CM | POA: Diagnosis not present

## 2017-11-12 DIAGNOSIS — N400 Enlarged prostate without lower urinary tract symptoms: Secondary | ICD-10-CM | POA: Diagnosis present

## 2017-11-12 DIAGNOSIS — E119 Type 2 diabetes mellitus without complications: Secondary | ICD-10-CM | POA: Diagnosis present

## 2017-11-12 DIAGNOSIS — J449 Chronic obstructive pulmonary disease, unspecified: Secondary | ICD-10-CM | POA: Diagnosis present

## 2017-11-12 DIAGNOSIS — Z9842 Cataract extraction status, left eye: Secondary | ICD-10-CM

## 2017-11-12 DIAGNOSIS — Z9841 Cataract extraction status, right eye: Secondary | ICD-10-CM

## 2017-11-12 DIAGNOSIS — I480 Paroxysmal atrial fibrillation: Principal | ICD-10-CM

## 2017-11-12 DIAGNOSIS — E669 Obesity, unspecified: Secondary | ICD-10-CM | POA: Diagnosis present

## 2017-11-12 DIAGNOSIS — I2510 Atherosclerotic heart disease of native coronary artery without angina pectoris: Secondary | ICD-10-CM | POA: Diagnosis present

## 2017-11-12 DIAGNOSIS — Z903 Acquired absence of stomach [part of]: Secondary | ICD-10-CM

## 2017-11-12 DIAGNOSIS — Z8673 Personal history of transient ischemic attack (TIA), and cerebral infarction without residual deficits: Secondary | ICD-10-CM

## 2017-11-12 DIAGNOSIS — Z794 Long term (current) use of insulin: Secondary | ICD-10-CM | POA: Diagnosis not present

## 2017-11-12 DIAGNOSIS — I959 Hypotension, unspecified: Secondary | ICD-10-CM | POA: Diagnosis present

## 2017-11-12 DIAGNOSIS — Z720 Tobacco use: Secondary | ICD-10-CM | POA: Diagnosis not present

## 2017-11-12 LAB — BASIC METABOLIC PANEL
ANION GAP: 9 (ref 5–15)
BUN: 10 mg/dL (ref 8–23)
CO2: 22 mmol/L (ref 22–32)
Calcium: 8.7 mg/dL — ABNORMAL LOW (ref 8.9–10.3)
Chloride: 108 mmol/L (ref 98–111)
Creatinine, Ser: 0.84 mg/dL (ref 0.61–1.24)
Glucose, Bld: 130 mg/dL — ABNORMAL HIGH (ref 70–99)
POTASSIUM: 4.1 mmol/L (ref 3.5–5.1)
SODIUM: 139 mmol/L (ref 135–145)

## 2017-11-12 LAB — MAGNESIUM: MAGNESIUM: 2 mg/dL (ref 1.7–2.4)

## 2017-11-12 LAB — CBC
HEMATOCRIT: 34.2 % — AB (ref 39.0–52.0)
HEMOGLOBIN: 10.6 g/dL — AB (ref 13.0–17.0)
MCH: 28.9 pg (ref 26.0–34.0)
MCHC: 31 g/dL (ref 30.0–36.0)
MCV: 93.2 fL (ref 78.0–100.0)
Platelets: 277 10*3/uL (ref 150–400)
RBC: 3.67 MIL/uL — AB (ref 4.22–5.81)
RDW: 14.8 % (ref 11.5–15.5)
WBC: 7.4 10*3/uL (ref 4.0–10.5)

## 2017-11-12 LAB — RAPID URINE DRUG SCREEN, HOSP PERFORMED
AMPHETAMINES: NOT DETECTED
Barbiturates: NOT DETECTED
Benzodiazepines: NOT DETECTED
COCAINE: NOT DETECTED
OPIATES: POSITIVE — AB
Tetrahydrocannabinol: NOT DETECTED

## 2017-11-12 LAB — I-STAT TROPONIN, ED: TROPONIN I, POC: 0.01 ng/mL (ref 0.00–0.08)

## 2017-11-12 MED ORDER — SODIUM CHLORIDE 0.9 % IV BOLUS
500.0000 mL | Freq: Once | INTRAVENOUS | Status: AC
  Administered 2017-11-12: 500 mL via INTRAVENOUS

## 2017-11-12 MED ORDER — COENZYME Q10 30 MG PO CAPS: 30.0000 mg | ORAL_CAPSULE | Freq: Every day | ORAL | Status: DC

## 2017-11-12 MED ORDER — DICLOFENAC SODIUM 1 % TD GEL
4.0000 g | Freq: Three times a day (TID) | TRANSDERMAL | Status: DC | PRN
  Administered 2017-11-13 – 2017-11-14 (×2): 4 g via TOPICAL
  Filled 2017-11-12 (×2): qty 100

## 2017-11-12 MED ORDER — ASPIRIN EC 81 MG PO TBEC
81.0000 mg | DELAYED_RELEASE_TABLET | Freq: Every day | ORAL | Status: DC
  Administered 2017-11-13 – 2017-11-16 (×4): 81 mg via ORAL
  Filled 2017-11-12 (×4): qty 1

## 2017-11-12 MED ORDER — ENOXAPARIN SODIUM 40 MG/0.4ML ~~LOC~~ SOLN
40.0000 mg | SUBCUTANEOUS | Status: DC
  Administered 2017-11-12 – 2017-11-15 (×4): 40 mg via SUBCUTANEOUS
  Filled 2017-11-12 (×5): qty 0.4

## 2017-11-12 MED ORDER — ONDANSETRON HCL 4 MG/2ML IJ SOLN: 4.0000 mg | Freq: Four times a day (QID) | INTRAMUSCULAR | Status: DC | PRN

## 2017-11-12 MED ORDER — PRAVASTATIN SODIUM 40 MG PO TABS
40.0000 mg | ORAL_TABLET | Freq: Every evening | ORAL | Status: DC
  Administered 2017-11-12 – 2017-11-15 (×4): 40 mg via ORAL
  Filled 2017-11-12 (×4): qty 1

## 2017-11-12 MED ORDER — POLYETHYLENE GLYCOL 3350 17 G PO PACK: 17.0000 g | PACK | ORAL | Status: DC | PRN

## 2017-11-12 MED ORDER — TAMSULOSIN HCL 0.4 MG PO CAPS
0.4000 mg | ORAL_CAPSULE | Freq: Every day | ORAL | Status: DC
  Administered 2017-11-13 – 2017-11-16 (×4): 0.4 mg via ORAL
  Filled 2017-11-12 (×4): qty 1

## 2017-11-12 MED ORDER — INSULIN ASPART 100 UNIT/ML ~~LOC~~ SOLN
0.0000 [IU] | Freq: Three times a day (TID) | SUBCUTANEOUS | Status: DC
  Administered 2017-11-13: 2 [IU] via SUBCUTANEOUS
  Administered 2017-11-14: 3 [IU] via SUBCUTANEOUS
  Administered 2017-11-14 – 2017-11-15 (×3): 1 [IU] via SUBCUTANEOUS
  Administered 2017-11-15: 5 [IU] via SUBCUTANEOUS
  Administered 2017-11-15: 1 [IU] via SUBCUTANEOUS
  Administered 2017-11-16 (×2): 2 [IU] via SUBCUTANEOUS

## 2017-11-12 MED ORDER — ACETAMINOPHEN 325 MG PO TABS
650.0000 mg | ORAL_TABLET | ORAL | Status: DC | PRN
  Administered 2017-11-13: 650 mg via ORAL
  Filled 2017-11-12: qty 2

## 2017-11-12 MED ORDER — METFORMIN HCL 500 MG PO TABS
1000.0000 mg | ORAL_TABLET | Freq: Two times a day (BID) | ORAL | Status: DC
  Administered 2017-11-12 – 2017-11-16 (×7): 1000 mg via ORAL
  Filled 2017-11-12 (×8): qty 2

## 2017-11-12 MED ORDER — PANTOPRAZOLE SODIUM 40 MG PO TBEC
40.0000 mg | DELAYED_RELEASE_TABLET | Freq: Two times a day (BID) | ORAL | Status: DC
  Administered 2017-11-12 – 2017-11-16 (×8): 40 mg via ORAL
  Filled 2017-11-12 (×8): qty 1

## 2017-11-12 MED ORDER — DILTIAZEM HCL ER 90 MG PO CP12
90.0000 mg | ORAL_CAPSULE | Freq: Two times a day (BID) | ORAL | Status: DC
  Administered 2017-11-12: 90 mg via ORAL
  Filled 2017-11-12 (×2): qty 1

## 2017-11-12 NOTE — Consult Note (Signed)
Cardiology Consultation:   Patient ID: [name]; [id number]; 02/01/1941   Admit date: 11/12/2017 Date of Consult: 11/12/2017  Primary Care Provider: [name], MD Primary Cardiologist: Glenetta Hew, MD  Primary Electrophysiologist:  new   Patient Profile:   [name] is a 77 y.o. male with a hx of accessible atrial fibrillation and chronic diastolic heart failure who is being seen today for the evaluation of atrial fibrillation at the request of [name].  History of Present Illness:   [name] is a 77 year old man who was discharged from the hospital on 817 after admission for atrial fibrillation with rapid rates.  He has been having dizziness and palpitations since that time.  His heart rate and blood pressure have been both elevated despite taking all of his medications at discharge.  He has been symptomatic with palpitations, sweats, dizziness, and nausea.  At home, he took his heart rate and blood pressure.  His blood pressure was well controlled, but his heart rate at times got into the 180s.  This was confirmed by EMS and thus he was brought to the emergency room.  He has had no chest pain or loss of consciousness.    He was previously on flecainide but has been taken off.  He is currently not anticoagulated due to history of GI bleeds and anemia.  In February 2019, he did have a hemoglobin of 5.4.  He does have a history of ablation for both atrial fibrillation and atrial flutter.  Past Medical History:  Diagnosis Date  . Anemia    takes Ferrous Sulfate daily  . Arthritis    "all over"  . Balance problem 01/2014  . CAP (community acquired pneumonia) 09/18/2014  . Cervical radiculopathy due to degenerative joint disease of spine   . COPD (chronic obstructive pulmonary disease) ([place])   . Coronary artery disease, non-occlusive    a. 03/2010 Nonocclusive disease by cath, performed for ST elevations on ECG;  b. 06/2013 Lexi MV: EF 60%, no ischemia.    . Diabetes mellitus type II    takes Metformin and Lantus daily  . Diastolic CHF, chronic ([place])    a. 12/2012 EF 55-60%, diast dysfxn, triv MR, mildly dil LA/RA.  [name] History of blood transfusion 1982   "when I had stomach OR"  . History of bronchitis    1998  . History of gastric ulcer   . HTN (hypertension)    takes Diltiazem daily  . Hyperlipidemia    takes Pravastatin daily  . Joint pain   . PAF (paroxysmal atrial fibrillation) ([facility])    Recurrent after atrial flutter (a. 07/2010 Status post caval tricuspid isthmus ablation by Dr. Midge Aver Metoprolol daily), currently controlled on flecainide plus diltiazem and Coumadin  . Weakness    numbness and tingling both hands    Past Surgical History:  Procedure Laterality Date  . ATRIAL ABLATION SURGERY  08/05/10   CTI ablation for atrial flutter by JA  . CARDIAC CATHETERIZATION  2012   nl LV function, no occlusive CAD, PAF  . CARDIOVERSION  12/07/2010    Successful direct current cardioversion with atrial fibrillation to normal sinus rhythm  . CARPAL TUNNEL RELEASE Bilateral 01/30/2014   Procedure: BILATERAL CARPAL TUNNEL RELEASE;  Surgeon: Marianna Payment, MD;  Location: [facility];  Service: Orthopedics;  Laterality: Bilateral;  . CATARACT EXTRACTION W/ INTRAOCULAR LENS  IMPLANT, BILATERAL Bilateral   . COLONOSCOPY [name] 12/02/2013   Procedure: COLONOSCOPY;  Surgeon: Irene Shipper, MD;  Location[facility]  ENDOSCOPY;  Service: Endoscopy;  Laterality: [name];  . ESOPHAGOGASTRODUODENOSCOPY [name] 09/22/2014   Procedure: ESOPHAGOGASTRODUODENOSCOPY (EGD);  Surgeon: Ronald Lobo, MD;  Location: [facility] ENDOSCOPY;  Service: Endoscopy;  Laterality: [name];  . ESOPHAGOGASTRODUODENOSCOPY [name] 07/11/2016   Procedure: ESOPHAGOGASTRODUODENOSCOPY (EGD);  Surgeon: Doran Stabler, MD;  Location: [facility] ENDOSCOPY;  Service: Endoscopy;  Laterality: [name];  . INCISION AND DRAINAGE ABSCESS / HEMATOMA OF BURSA / KNEE / THIGH Left 1998   knee  . KNEE BURSECTOMY Left 1998  .  LAPAROSCOPIC CHOLECYSTECTOMY  03/2010  . LEFT HEART CATH AND CORONARY ANGIOGRAPHY [name] 10/05/2017   Procedure: LEFT HEART CATH AND CORONARY ANGIOGRAPHY;  Surgeon: Martinique, Peter M, MD;  Location: [place] CV LAB;  Service: Cardiovascular;  Laterality: [name];  . NM MYOVIEW LTD  07/22/2013   Normal EF ~60%, no ischemia or infarction.  [name] PARTIAL GASTRECTOMY  1982   subtotal; "took out 30% for ulcers"  . TRANSTHORACIC ECHOCARDIOGRAM  02/16/2014   EF 60%, no RWMA. - otherwise normal  . YAG LASER APPLICATION Bilateral      Home Medications:  Prior to Admission medications   Medication Sig Start Date End Date Taking? Authorizing Provider  acetaminophen (TYLENOL) 325 MG tablet Take 325 mg by mouth every 6 (six) hours as needed for mild pain.   Yes [provider]  albuterol (PROVENTIL HFA;VENTOLIN HFA) 108 (90 Base) MCG/ACT inhaler Inhale 2 puffs into the lungs every 4 (four) hours as needed for wheezing or shortness of breath. 10/30/17  Yes Palumbo, April, MD  aspirin EC 81 MG tablet Take 81 mg by mouth daily.   Yes [provider]  co-enzyme Q-10 30 MG capsule Take 30 mg by mouth daily.    Yes [provider]  diclofenac sodium (VOLTAREN) 1 % GEL Apply 4 g topically 3 (three) times daily as needed (for back pain). 11/10/17  Yes Rory Percy, DO  diltiazem (CARDIZEM CD) 120 MG 24 hr capsule Take 1 capsule (120 mg total) by mouth daily. 05/04/17  Yes Leonie Man, MD  hydrocortisone cream 1 % Apply 1 application topically 2 (two) times daily.  01/02/17  Yes [provider]  metFORMIN (GLUCOPHAGE) 1000 MG tablet Take 1,000 mg by mouth 2 (two) times daily with a meal.    Yes [provider]  metoprolol succinate (TOPROL-XL) 25 MG 24 hr tablet TAKE 1/2 TABLET  (12.5 MG TOTAL) BY MOUTH DAILY. Patient taking differently: Take 12.5 mg by mouth every evening.  05/04/17  Yes Leonie Man, MD  Multiple Vitamin (MULTIVITAMIN WITH MINERALS) TABS tablet Take 1 tablet  by mouth 2 (two) times daily.    Yes [provider]  nitroGLYCERIN (NITROSTAT) 0.4 MG SL tablet Place 1 tablet (0.4 mg total) under the tongue every 5 (five) minutes x 3 doses as needed for chest pain. 11/19/15  Yes Kilroy, Luke K, PA-C  pantoprazole (PROTONIX) 40 MG tablet Take 1 tablet (40 mg total) by mouth 2 (two) times daily before a meal. 11/30/16  Yes Domenic Polite, MD  polyethylene glycol [facility] / GLYCOLAX) packet Take 17 g by mouth as needed for moderate constipation. 11/10/17  Yes Rory Percy, DO  pravastatin (PRAVACHOL) 40 MG tablet Take 1 tablet (40 mg total) by mouth every evening. 05/04/17  Yes Leonie Man, MD  tamsulosin (FLOMAX) 0.4 MG CAPS capsule Take 0.4 mg by mouth daily. 02/20/17  Yes [provider]    Inpatient Medications: Scheduled Meds:  Continuous Infusions:  PRN Meds:   Allergies:  Allergies  Allergen Reactions  . Cyclobenzaprine Other (See Comments)    Unsteady gait    Social History:   Social History   Socioeconomic History  . Marital status: Widowed    Spouse name: Not on file  . Number of children: Not on file  . Years of education: Not on file  . Highest education level: Not on file  Occupational History  . Occupation: Retired  Scientific laboratory technician  . Financial resource strain: Not on file  . Food insecurity:    Worry: Not on file    Inability: Not on file  . Transportation needs:    Medical: Not on file    Non-medical: Not on file  Tobacco Use  . Smoking status: Current Every Day Smoker    Packs/day: 0.50    Years: 57.00    Pack years: 28.50    Types: Cigarettes  . Smokeless tobacco: Never Used  . Tobacco comment: He's been smoking between 0.5 and 2 ppd since age 26.  Substance and Sexual Activity  . Alcohol use: No    Alcohol/week: 0.0 standard drinks    Comment: "quit drinking in 1986"  . Drug use: No  . Sexual activity: Never  Lifestyle  . Physical activity:    Days per week: Not on file    Minutes per  session: Not on file  . Stress: Not on file  Relationships  . Social connections:    Talks on phone: Not on file    Gets together: Not on file    Attends religious service: Not on file    Active member of club or organization: Not on file    Attends meetings of clubs or organizations: Not on file    Relationship status: Not on file  . Intimate partner violence:    Fear of current or ex partner: Not on file    Emotionally abused: Not on file    Physically abused: Not on file    Forced sexual activity: Not on file  Other Topics Concern  . Not on file  Social History Narrative   He is a widower, who moved to [place] in 2011 to live closer to his daughter. He formerly lived in [place]. He is a father of 7, grandfather, and great-grandfather of 39. Does not drink alcohol, context of smoker.    Family History:    Family History  Problem Relation Age of Onset  . Cancer Mother   . Heart attack Father   . Heart attack Brother      ROS:  Please see the history of present illness.   All other ROS reviewed and negative.     Physical Exam/Data:   Vitals:   11/12/17 1408 11/12/17 1415 11/12/17 1500 11/12/17 1600  BP: 92/67 92/63 91/60  126/66  Pulse: 93 93 93 (!) 115  Resp: 20 17 17  (!) 23  Temp:      TempSrc:      SpO2: 97% 98% 97% 100%  Weight:      Height:        Intake/Output Summary (Last 24 hours) at 11/12/2017 1636 Last data filed at 11/12/2017 1631 Gross per 24 hour  Intake 500 ml  Output -  Net 500 ml   Filed Weights   11/12/17 1308  Weight: 75.9 kg   Body mass index is 22.69 kg/m.  General:  Well nourished, well developed, in no acute distress HEENT: normal Lymph: no adenopathy Neck: no JVD Endocrine:  No thryomegaly Vascular: No carotid  bruits; FA pulses 2+ bilaterally without bruits  Cardiac:  normal S1, S2; iRRR; no murmur  Lungs:  clear to auscultation bilaterally, no wheezing, rhonchi or rales  Abd: soft, nontender, no hepatomegaly  Ext: no  edema Musculoskeletal:  No deformities, BUE and BLE strength normal and equal Skin: warm and dry  Neuro:  CNs 2-12 intact, no focal abnormalities noted Psych:  Normal affect   EKG:  The EKG was personally reviewed and demonstrates: Atrial fibrillation, rate 91 Telemetry:  Telemetry was personally reviewed and demonstrates: Atrial fibrillation  Relevant CV Studies: TTE 10/05/17 - Left ventricle: The cavity size was normal. Wall thickness was   increased in a pattern of mild LVH. Systolic function was normal.   The estimated ejection fraction was in the range of 55% to 60%. - Aortic valve: There was trivial regurgitation. - Mitral valve: There was mild regurgitation. - Left atrium: The atrium was mildly dilated.  LHC 10/05/17  Dist RCA lesion is 40% stenosed.  Ost 1st Sept lesion is 95% stenosed.  LV end diastolic pressure is normal.  Laboratory Data:  Chemistry Recent Labs  Lab 11/08/17 1313 11/08/17 1844 11/09/17 0626 11/12/17 1318  NA 138  --  139 139  K 3.9  --  3.9 4.1  CL 105  --  106 108  CO2 25  --  24 22  GLUCOSE 145*  --  158* 130*  BUN 11  --  11 10  CREATININE 0.90 0.87 0.99 0.84  CALCIUM 8.1*  --  7.9* 8.7*  GFRNONAA >60 >60 >60 >60  GFRAA >60 >60 >60 >60  ANIONGAP 8  --  9 9    No results for input(s): PROT, ALBUMIN, AST, ALT, ALKPHOS, BILITOT in the last 168 hours. Hematology Recent Labs  Lab 11/09/17 0626 11/10/17 0444 11/12/17 1318  WBC 7.7 8.4 7.4  RBC 3.71* 3.73* 3.67*  HGB 10.9* 10.9* 10.6*  HCT 35.2* 35.2* 34.2*  MCV 94.9 94.4 93.2  MCH 29.4 29.2 28.9  MCHC 31.0 31.0 31.0  RDW 15.0 14.7 14.8  PLT 243 244 277   Cardiac Enzymes Recent Labs  Lab 11/08/17 1844 11/09/17 0040 11/09/17 0626  TROPONINI <0.03 <0.03 <0.03    Recent Labs  Lab 11/07/17 1852 11/12/17 1403  TROPIPOC 0.01 0.01    BNP Recent Labs  Lab 11/08/17 2113  BNP 267.5*    DDimer No results for input(s): DDIMER in the last 168 hours.  Radiology/Studies:    Dg Chest 2 View  Result Date: 11/12/2017 CLINICAL DATA:  Atrial fibrillation with shortness of breath EXAM: CHEST - 2 VIEW COMPARISON:  11/07/2017 FINDINGS: Normal heart size and mediastinal contours. Chronic interstitial coarsening. There is no edema, consolidation, effusion, or pneumothorax. Artifact from EKG leads. IMPRESSION: Stable from prior.  No acute finding. Electronically Signed   By: Monte Fantasia M.D.   On: 11/12/2017 13:56    Assessment and Plan:   1. Persistent atrial fibrillation with rapid rates: Currently on diltiazem.  He was discharged from the hospital recently on p.o. diltiazem.  He was previously on flecainide, but was found to have coronary artery disease.  He is on metoprolol at home, but is does not appear to be controlling his atrial fibrillation.  Unfortunately, he has not been anticoagulated in the past due to chronic anemia.  He had initially planned for rate control and was put on diltiazem.  At discharge, he was put on low-dose diltiazem which is not controlled his heart rate.  We Quisha Mabie thus  increase he Pheonix Wisby diltiazem dose and stop his metoprolol.  We Canesha Tesfaye hold off on anticoagulation for now.  We Pranay Hilbun discuss this with potentially GI as an outpatient.  This patients CHA2DS2-VASc Score and unadjusted Ischemic Stroke Rate (% per year) is equal to 9.7 % stroke rate/year from a score of 6  Above score calculated as 1 point each if present [CHF, HTN, DM, Vascular=MI/PAD/Aortic Plaque, Age if 65-74, or Male] Above score calculated as 2 points each if present [Age > 75, or Stroke/TIA/TE]      For questions or updates, please contact [facility] Please consult [email or web address] for contact info under Cardiology/STEMI.   Signed, Belvin Gauss Meredith Leeds, MD  11/12/2017 4:36 PM

## 2017-11-12 NOTE — H&P (Signed)
[facility] Admission History and Physical Service Pager: [phone]  Patient name: [name] Medical record number: [id number] Date of birth: [birth year] Age: 77 y.o. Gender: male  Primary Care Provider: [name], MD Consultations: cardiology, electrophysiology  Code Status: full  Chief Complaint: palpitations, dizziness  Assessment and Plan: Kellin Powellis a 77 y.o.malepresenting to the ED on 8/19withchest palpitations. PMH is significant forA-fib(S/P ablation2012,diltiazem daily), COPD, CAD, diastolic CHF, A3FT, HTN, HLD, anemia.  Paroxysmal Afib/flutter with RVR- Patient was discharged on 8/17 with similar chief complaint. He was having continued tachycardia with sensation of chest flutter since discharge. home meds- Diltiazem 120mg  daily, metoprolol 25mg  daily, ASA 81mg , no anticoagulation due to contraindications. His rate was poorly controlled on home regimen as patient states he was taking his medication and monitoring his heart rate at home. He had increased flutter this morning which prompted him to call EMS who gave him 5mg  metoprolol. Vital signs in ED at time of exam are stable with slight tachycardic rate of 98-100. Blood pressure 126/66. O2 saturation 100% on room air. BMP showed normal electrolytes with glucose 130, calcium 8.7, creatinine 0.84, GFR >60. CBC showed WBC 7.4, RBC 3.67, Hgb 10.6. I-Stat troponin was 0.01 Chest Xray showed no acute findings, stable from prior admission. EKG shows Afib with no acute changes since last admission. CHA2DS2-VASc score 6. Etiology of persistent Afib/flutter likely due to poor medical management with unknown if patient is taking meds correctly with evidence of poor understanding on multiple medications.  -admit to telemetry, attending Dr. Owens Shark -consult cardiology appreciate recs -consult electrophysiology appreciate recs  -increase diltiazem dose to 90mg  BID  -discontinue  metoprolol  -continue to hold anticoagulation - continue home meds pending card recs  - repeat EKG am  CAD/HFrEF- no evidence of fluid overload on exam. Left heart cath 7/12 showed single vessel CAD involving the ostium of the first septal perforator, otherwise nonobstructive. Normal LVEDP. No intervention. ECHO on 7/12 showed normal LV size with normal systolic function and eEF 55-60%.  - monitor I&Os -follow cards recs  T2DM A1c 5.6% on last admission. Glucose on admission is 130 - continue metformin 1000mg  BID - monitor CBGs daily   COPD- patient complains of shortness of breath with exertion x2 days improved with rest. Endorses a chronic dry cough- no sputum. O2 sats 100 ORA. -continue home meds - monitor  Iron Deficiency Anemia- Hgb 10.6 on admission which is consistent with previous admission. Ferritin on 8/17 was 17. patient takes multivitamin/ mineral BID at home - continue to monitor with daily CBC - consider adding iron supplement if repeat Hgb decreases  Chronic Back Pain- patient states he has chronic mild lower back pain on admission unchanged from previous pain. Has diclofenac gel at home - continue diclofenac gel PRN   BPH Stable on tamsulosin at home. Patient was using the urinal to urinate at time of admission exam - continue tamsulosin - monitor I&Os  FEN/GI: regular diet PPx: lovenox  Disposition: admit to telemetry   Subjective:  [name] is a 77 y.o. male presenting with palpitations and dizziness since discharge on 8/17 Patient was admitted recently for same complaint. He states that he has been monitoring his heart rate and blood pressure at home since discharge and they have both been elevated despite taking all of his discharged medications as prescribed. He has also been symptomatic to include what he describes as a flutter in his chest intermittently over the past two days that starts on the  left side of the chest and radiates to the  right. There is associated sweating, dizziness and nausea during these episodes of fluttering but no chest pain or LOC. He denies any swelling. The fluttering is associated with SOB and becomes worse with standing and activity and is improved by laying down and resting. Patient's daughter manages his medications and was organizing home health to come out to see him but they have not been out to see him at home yet. Patient states he has had increased blood sugar levels since discharge in the 150's to 280 at the highest. He has been using insulin despite being discharged with orders to discontinue using insulin due to his low A1c at last admission. We explained this reason again to the patient and he expressed understanding. He was also confused about why he was taken off flecanide but he was not on that medication prior to his last admission and it was not continued at admission due to coronary artery disease. He has not been anticoagulated due to other contraindications.  Smokes 2-3 cigarettes per day, wants nicotine patch. Used to smoke 2ppd Hasn't drank alcohol in 14-78 years No illicit drug use.  Review of Systems  Constitutional: Positive for diaphoresis. Negative for fever.  Respiratory: Positive for cough (persistant dry cough residual from bronchitis ) and shortness of breath.   Cardiovascular: Positive for palpitations. Negative for chest pain and leg swelling.  Gastrointestinal: Negative for abdominal pain, constipation, diarrhea, nausea and vomiting.  Genitourinary: Negative for dysuria.  Musculoskeletal: Positive for back pain.  Neurological: Positive for dizziness.  Psychiatric/Behavioral: Negative for substance abuse.    Patient Active Problem List   Diagnosis Date Noted  . Atrial fibrillation with RVR ([place]) 11/08/2017  . Chest pain with moderate risk of acute coronary syndrome 10/04/2017  . HTN (hypertension) 05/21/2017  . Diabetes mellitus type II 05/21/2017  . Diastolic  CHF, chronic ([place]) 05/21/2017  . COPD (chronic obstructive pulmonary disease) ([place]) 05/21/2017  . H/O atrial flutter 05/21/2017  . Coronary disease/nonobstructive 05/21/2017  . Acute blood loss anemia 10/02/2016  . Abdominal pain 10/02/2016  . Constipation 10/02/2016  . Left lower quadrant pain   . Coagulopathy ([place])   . Heme positive stool   . Symptomatic anemia 07/10/2016  . Syncope 05/15/2015  . Cervical disc disease 05/15/2015  . Abnormal urinalysis 05/15/2015  . Anemia 05/15/2015  . History of CVA (cerebrovascular accident) 04/03/2014  . Near syncope 02/15/2014  . Pre-syncope 02/15/2014  . Benign neoplasm of rectum and anal canal 12/02/2013  . Upper GI bleeding 11/30/2013  . Tobacco use disorder 11/30/2013  . Anticoagulation goal of INR 2 to 3, for PAF - CHA2DS2Vasc = 7 08/15/2013  . Type 2 diabetes mellitus ([place]) 01/22/2013  . Tobacco abuse counseling 12/15/2012  . Hyperlipidemia   . Obesity (BMI 30-39.9) 12/14/2012  . Atrial flutter - status post CTI ablation 07/29/2010  . Essential hypertension 07/29/2010  . Chronic diastolic congestive heart failure ([place]) 07/29/2010  . Coronary artery disease, non-occlusive 07/29/2010    Past Medical History: Past Medical History:  Diagnosis Date  . Anemia    takes Ferrous Sulfate daily  . Arthritis    "all over"  . Balance problem 01/2014  . CAP (community acquired pneumonia) 09/18/2014  . Cervical radiculopathy due to degenerative joint disease of spine   . COPD (chronic obstructive pulmonary disease) ([place])   . Coronary artery disease, non-occlusive    a. 03/2010 Nonocclusive disease by cath, performed for ST elevations on ECG;  b. 06/2013 Lexi MV: EF 60%, no ischemia.  . Diabetes mellitus type II    takes Metformin and Lantus daily  . Diastolic CHF, chronic ([place])    a. 12/2012 EF 55-60%, diast dysfxn, triv MR, mildly dil LA/RA.  [name] History of blood transfusion 1982   "when I had stomach OR"  . History of bronchitis    1998   . History of gastric ulcer   . HTN (hypertension)    takes Diltiazem daily  . Hyperlipidemia    takes Pravastatin daily  . Joint pain   . PAF (paroxysmal atrial fibrillation) ([facility])    Recurrent after atrial flutter (a. 07/2010 Status post caval tricuspid isthmus ablation by Dr. Midge Aver Metoprolol daily), currently controlled on flecainide plus diltiazem and Coumadin  . Weakness    numbness and tingling both hands    Past Surgical History: Past Surgical History:  Procedure Laterality Date  . ATRIAL ABLATION SURGERY  08/05/10   CTI ablation for atrial flutter by [facility]  . CARDIAC CATHETERIZATION  2012   nl LV function, no occlusive CAD, PAF  . CARDIOVERSION  12/07/2010    Successful direct current cardioversion with atrial fibrillation to normal sinus rhythm  . CARPAL TUNNEL RELEASE Bilateral 01/30/2014   Procedure: BILATERAL CARPAL TUNNEL RELEASE;  Surgeon: Marianna Payment, MD;  Location: [facility];  Service: Orthopedics;  Laterality: Bilateral;  . CATARACT EXTRACTION W/ INTRAOCULAR LENS  IMPLANT, BILATERAL Bilateral   . COLONOSCOPY [name] 12/02/2013   Procedure: COLONOSCOPY;  Surgeon: Irene Shipper, MD;  Location: [facility];  Service: Endoscopy;  Laterality: [name];  . ESOPHAGOGASTRODUODENOSCOPY [name] 09/22/2014   Procedure: ESOPHAGOGASTRODUODENOSCOPY (EGD);  Surgeon: Ronald Lobo, MD;  [facility];  Service: Endoscopy;  Laterality: [name];  . ESOPHAGOGASTRODUODENOSCOPY [name] 07/11/2016   Procedure: ESOPHAGOGASTRODUODENOSCOPY (EGD);  Surgeon: Doran Stabler, MD;  Location: [facility] ENDOSCOPY;  Service: Endoscopy;  Laterality: [name];  . INCISION AND DRAINAGE ABSCESS / HEMATOMA OF BURSA / KNEE / THIGH Left 1998   knee  . KNEE BURSECTOMY Left 1998  . LAPAROSCOPIC CHOLECYSTECTOMY  03/2010  . LEFT HEART CATH AND CORONARY ANGIOGRAPHY [name] 10/05/2017   Procedure: LEFT HEART CATH AND CORONARY ANGIOGRAPHY;  Surgeon: Martinique, Peter M, MD;  Location: [facility];  Service: Cardiovascular;   Laterality: [name];  . NM MYOVIEW LTD  07/22/2013   Normal EF ~60%, no ischemia or infarction.  [name] PARTIAL GASTRECTOMY  1982   subtotal; "took out 30% for ulcers"  . TRANSTHORACIC ECHOCARDIOGRAM  02/16/2014   EF 60%, no RWMA. - otherwise normal  . YAG LASER APPLICATION Bilateral     Social History: Social History   Tobacco Use  . Smoking status: Current Every Day Smoker    Packs/day: 0.50    Years: 57.00    Pack years: 28.50    Types: Cigarettes  . Smokeless tobacco: Never Used  . Tobacco comment: He's been smoking between 0.5 and 2 ppd since age 64.  Substance Use Topics  . Alcohol use: No    Alcohol/week: 0.0 standard drinks    Comment: "quit drinking in 1986"  . Drug use: No   Additional social history: Smokes 2-3 cigarettes per day, wants nicotine patch. Used to smoke 2ppd Hasn't drank alcohol in 68-11 years No illicit drug use. Please also refer to relevant sections of EMR.  Family History: Family History  Problem Relation Age of Onset  . Cancer Mother   . Heart attack Father   . Heart attack Brother  Allergies and Medications: Allergies  Allergen Reactions  . Cyclobenzaprine Other (See Comments)    Unsteady gait   No current facility-administered medications on file prior to encounter.    Current Outpatient Medications on File Prior to Encounter  Medication Sig Dispense Refill  . acetaminophen (TYLENOL) 325 MG tablet Take 325 mg by mouth every 6 (six) hours as needed for mild pain.    [name] albuterol (PROVENTIL HFA;VENTOLIN HFA) 108 (90 Base) MCG/ACT inhaler Inhale 2 puffs into the lungs every 4 (four) hours as needed for wheezing or shortness of breath. 1 Inhaler 0  . aspirin EC 81 MG tablet Take 81 mg by mouth daily.    [name] co-enzyme Q-10 30 MG capsule Take 30 mg by mouth daily.     . diclofenac sodium (VOLTAREN) 1 % GEL Apply 4 g topically 3 (three) times daily as needed (for back pain). 1 Tube 0  . diltiazem (CARDIZEM CD) 120 MG 24 hr capsule Take 1 capsule  (120 mg total) by mouth daily. 90 capsule 3  . hydrocortisone cream 1 % Apply 1 application topically 2 (two) times daily.     . metFORMIN (GLUCOPHAGE) 1000 MG tablet Take 1,000 mg by mouth 2 (two) times daily with a meal.     . metoprolol succinate (TOPROL-XL) 25 MG 24 hr tablet TAKE 1/2 TABLET  (12.5 MG TOTAL) BY MOUTH DAILY. (Patient taking differently: Take 12.5 mg by mouth every evening. ) 45 tablet 3  . Multiple Vitamin (MULTIVITAMIN WITH MINERALS) TABS tablet Take 1 tablet by mouth 2 (two) times daily.     . nitroGLYCERIN (NITROSTAT) 0.4 MG SL tablet Place 1 tablet (0.4 mg total) under the tongue every 5 (five) minutes x 3 doses as needed for chest pain. 25 tablet 9  . pantoprazole (PROTONIX) 40 MG tablet Take 1 tablet (40 mg total) by mouth 2 (two) times daily before a meal.    . polyethylene glycol (MIRALAX / GLYCOLAX) packet Take 17 g by mouth as needed for moderate constipation. 10 each 0  . pravastatin (PRAVACHOL) 40 MG tablet Take 1 tablet (40 mg total) by mouth every evening. 90 tablet 3  . tamsulosin (FLOMAX) 0.4 MG CAPS capsule Take 0.4 mg by mouth daily.      Objective: BP 91/60   Pulse 93   Temp 98.1 F (36.7 C) (Oral)   Resp 17   Ht 6' (1.829 m)   Wt 75.9 kg   SpO2 97%   BMI 22.69 kg/m   Exam: General: NAD, pleasant, talkative, cooperative Neck: supple Cardiovascular: Regular rhythm, fast rate, no murmurs appreciated  Respiratory: CTAB in lower lobes, mild rales in bilateral upper lobes Neuro: moving all extremities equally, eye deviation Psych: alert and oriented x3 Abdomen: soft, non-distended, non-tender to palpation Back: no lesions, non-tender to palpation Extemities: no swelling in lower extremities  Labs and Imaging: CBC BMET  Recent Labs  Lab 11/12/17 1318  WBC 7.4  HGB 10.6*  HCT 34.2*  PLT 277   Recent Labs  Lab 11/12/17 1318  NA 139  K 4.1  CL 108  CO2 22  BUN 10  CREATININE 0.84  GLUCOSE 130*  CALCIUM 8.7*     istat troponins  negative  Richarda Osmond, DO 11/12/2017, 3:29 PM PGY-1, [place] [facility] pager: [phone], text pages welcome

## 2017-11-12 NOTE — ED Notes (Signed)
Helped get patient undress on the monitor did ekg shown to Dr Ronnald Nian patient is resting with call bell in reach

## 2017-11-12 NOTE — ED Notes (Signed)
Ordered Heart Healthy Diet

## 2017-11-12 NOTE — ED Triage Notes (Signed)
Pt arrives via EMS from home with reports of dizziness, SOB and palpitations starting last night. Pt reports being taken off of flecainide on 8/16. EMS reports HR going into 190s, 5 mg metoprolol given, HR in 90s.

## 2017-11-12 NOTE — ED Provider Notes (Addendum)
[facility] EMERGENCY DEPARTMENT Provider Note   CSN: [id number] Arrival date & time: 11/12/17  1303     History   Chief Complaint Chief Complaint  Patient presents with  . Palpitations  . Dizziness    HPI [name] is a 77 y.o. male.   The history is provided by the patient.   Palpitations    This is a recurrent problem. The current episode started more than 2 days ago. The problem occurs daily. The problem has been resolved. The problem is associated with an unknown factor. Associated symptoms include dizziness. Pertinent negatives include no diaphoresis, no fever, no numbness, no chest pain, no chest pressure, no claudication, no near-syncope, no abdominal pain, no vomiting, no back pain, no weakness, no cough and no shortness of breath. Treatments tried: 5 mg of metoprolol given by EMS.  The treatment provided significant relief. Risk factors include diabetes mellitus (afib). His past medical history is significant for heart disease.    Past Medical History:  Diagnosis Date  . Anemia    takes Ferrous Sulfate daily  . Arthritis    "all over"  . Balance problem 01/2014  . CAP (community acquired pneumonia) 09/18/2014  . Cervical radiculopathy due to degenerative joint disease of spine   . COPD (chronic obstructive pulmonary disease) ([place])   . Coronary artery disease, non-occlusive    a. 03/2010 Nonocclusive disease by cath, performed for ST elevations on ECG;  b. 06/2013 Lexi MV: EF 60%, no ischemia.  . Diabetes mellitus type II    takes Metformin and Lantus daily  . Diastolic CHF, chronic ([place])    a. 12/2012 EF 55-60%, diast dysfxn, triv MR, mildly dil LA/RA.  [name] History of blood transfusion 1982   "when I had stomach OR"  . History of bronchitis    1998  . History of gastric ulcer   . HTN (hypertension)    takes Diltiazem daily  . Hyperlipidemia    takes Pravastatin daily  . Joint pain   . PAF (paroxysmal atrial fibrillation) (HCC)    Recurrent after atrial flutter (a. 07/2010 Status post caval tricuspid isthmus ablation by Dr. Midge Aver Metoprolol daily), currently controlled on flecainide plus diltiazem and Coumadin  . Weakness    numbness and tingling both hands    Patient Active Problem List   Diagnosis Date Noted  . Atrial fibrillation with RVR ([place]) 11/08/2017  . Chest pain with moderate risk of acute coronary syndrome 10/04/2017  . HTN (hypertension) 05/21/2017  . Diabetes mellitus type II 05/21/2017  . Diastolic CHF, chronic ([place]) 05/21/2017  . COPD (chronic obstructive pulmonary disease) ([place]) 05/21/2017  . H/O atrial flutter 05/21/2017  . Coronary disease/nonobstructive 05/21/2017  . Acute blood loss anemia 10/02/2016  . Abdominal pain 10/02/2016  . Constipation 10/02/2016  . Left lower quadrant pain   . Coagulopathy ([place])   . Heme positive stool   . Symptomatic anemia 07/10/2016  . Syncope 05/15/2015  . Cervical disc disease 05/15/2015  . Abnormal urinalysis 05/15/2015  . Anemia 05/15/2015  . History of CVA (cerebrovascular accident) 04/03/2014  . Near syncope 02/15/2014  . Pre-syncope 02/15/2014  . Benign neoplasm of rectum and anal canal 12/02/2013  . Upper GI bleeding 11/30/2013  . Tobacco use disorder 11/30/2013  . Anticoagulation goal of INR 2 to 3, for PAF - CHA2DS2Vasc = 7 08/15/2013  . Type 2 diabetes mellitus ([place]) 01/22/2013  . Tobacco abuse counseling 12/15/2012  . Hyperlipidemia   . Obesity (BMI 30-39.9) 12/14/2012  .  Atrial flutter - status post CTI ablation 07/29/2010  . Essential hypertension 07/29/2010  . Chronic diastolic congestive heart failure ([place]) 07/29/2010  . Coronary artery disease, non-occlusive 07/29/2010    Past Surgical History:  Procedure Laterality Date  . ATRIAL ABLATION SURGERY  08/05/10   CTI ablation for atrial flutter by JA  . CARDIAC CATHETERIZATION  2012   nl LV function, no occlusive CAD, PAF  . CARDIOVERSION  12/07/2010    Successful direct  current cardioversion with atrial fibrillation to normal sinus rhythm  . CARPAL TUNNEL RELEASE Bilateral 01/30/2014   Procedure: BILATERAL CARPAL TUNNEL RELEASE;  Surgeon: Marianna Payment, MD;  Location: [facility];  Service: Orthopedics;  Laterality: Bilateral;  . CATARACT EXTRACTION W/ INTRAOCULAR LENS  IMPLANT, BILATERAL Bilateral   . COLONOSCOPY [name] 12/02/2013   Procedure: COLONOSCOPY;  Surgeon: Irene Shipper, MD;  Location: [facility];  Service: Endoscopy;  Laterality: [name];  . ESOPHAGOGASTRODUODENOSCOPY [name] 09/22/2014   Procedure: ESOPHAGOGASTRODUODENOSCOPY (EGD);  Surgeon: Ronald Lobo, MD;  Location[facility] ENDOSCOPY;  Service: Endoscopy;  Laterality: [name];  . ESOPHAGOGASTRODUODENOSCOPY [name] 07/11/2016   Procedure: ESOPHAGOGASTRODUODENOSCOPY (EGD);  Surgeon: Doran Stabler, MD;  Location: [facility] ENDOSCOPY;  Service: Endoscopy;  Laterality: [name];  . INCISION AND DRAINAGE ABSCESS / HEMATOMA OF BURSA / KNEE / THIGH Left 1998   knee  . KNEE BURSECTOMY Left 1998  . LAPAROSCOPIC CHOLECYSTECTOMY  03/2010  . LEFT HEART CATH AND CORONARY ANGIOGRAPHY [name] 10/05/2017   Procedure: LEFT HEART CATH AND CORONARY ANGIOGRAPHY;  Surgeon: Martinique, Peter M, MD;  Location: [facility] CV LAB;  Service: Cardiovascular;  Laterality: [name];  . NM MYOVIEW LTD  07/22/2013   Normal EF ~60%, no ischemia or infarction.  [name] PARTIAL GASTRECTOMY  1982   subtotal; "took out 30% for ulcers"  . TRANSTHORACIC ECHOCARDIOGRAM  02/16/2014   EF 60%, no RWMA. - otherwise normal  . YAG LASER APPLICATION Bilateral         Home Medications    Prior to Admission medications   Medication Sig Start Date End Date Taking? Authorizing Provider  acetaminophen (TYLENOL) 325 MG tablet Take 325 mg by mouth every 6 (six) hours as needed for mild pain.   Yes [provider]  albuterol (PROVENTIL HFA;VENTOLIN HFA) 108 (90 Base) MCG/ACT inhaler Inhale 2 puffs into the lungs every 4 (four) hours as needed for wheezing or shortness of breath.  10/30/17  Yes Palumbo, April, MD  aspirin EC 81 MG tablet Take 81 mg by mouth daily.   Yes [provider]  co-enzyme Q-10 30 MG capsule Take 30 mg by mouth daily.    Yes [provider]  diclofenac sodium (VOLTAREN) 1 % GEL Apply 4 g topically 3 (three) times daily as needed (for back pain). 11/10/17  Yes Rory Percy, DO  diltiazem (CARDIZEM CD) 120 MG 24 hr capsule Take 1 capsule (120 mg total) by mouth daily. 05/04/17  Yes Leonie Man, MD  hydrocortisone cream 1 % Apply 1 application topically 2 (two) times daily.  01/02/17  Yes [provider]  metFORMIN (GLUCOPHAGE) 1000 MG tablet Take 1,000 mg by mouth 2 (two) times daily with a meal.    Yes [provider]  metoprolol succinate (TOPROL-XL) 25 MG 24 hr tablet TAKE 1/2 TABLET  (12.5 MG TOTAL) BY MOUTH DAILY. Patient taking differently: Take 12.5 mg by mouth every evening.  05/04/17  Yes Leonie Man, MD  Multiple Vitamin (MULTIVITAMIN WITH MINERALS) TABS tablet Take 1 tablet by mouth  2 (two) times daily.    Yes [provider]  nitroGLYCERIN (NITROSTAT) 0.4 MG SL tablet Place 1 tablet (0.4 mg total) under the tongue every 5 (five) minutes x 3 doses as needed for chest pain. 11/19/15  Yes Kilroy, Luke K, PA-C  pantoprazole (PROTONIX) 40 MG tablet Take 1 tablet (40 mg total) by mouth 2 (two) times daily before a meal. 11/30/16  Yes Domenic Polite, MD  polyethylene glycol [facility] / GLYCOLAX) packet Take 17 g by mouth as needed for moderate constipation. 11/10/17  Yes Rory Percy, DO  pravastatin (PRAVACHOL) 40 MG tablet Take 1 tablet (40 mg total) by mouth every evening. 05/04/17  Yes Leonie Man, MD  tamsulosin (FLOMAX) 0.4 MG CAPS capsule Take 0.4 mg by mouth daily. 02/20/17  Yes [provider]    Family History Family History  Problem Relation Age of Onset  . Cancer Mother   . Heart attack Father   . Heart attack Brother     Social History Social History   Tobacco Use    . Smoking status: Current Every Day Smoker    Packs/day: 0.50    Years: 57.00    Pack years: 28.50    Types: Cigarettes  . Smokeless tobacco: Never Used  . Tobacco comment: He's been smoking between 0.5 and 2 ppd since age 89.  Substance Use Topics  . Alcohol use: No    Alcohol/week: 0.0 standard drinks    Comment: "quit drinking in 1986"  . Drug use: No     Allergies   Cyclobenzaprine   Review of Systems Review of Systems  Constitutional: Negative for chills, diaphoresis and fever.  HENT: Negative for ear pain and sore throat.   Eyes: Negative for pain and visual disturbance.  Respiratory: Negative for cough and shortness of breath.   Cardiovascular: Positive for palpitations. Negative for chest pain, claudication and near-syncope.  Gastrointestinal: Negative for abdominal pain and vomiting.  Genitourinary: Negative for dysuria and hematuria.  Musculoskeletal: Negative for arthralgias and back pain.  Skin: Negative for color change and rash.  Neurological: Positive for dizziness. Negative for seizures, syncope, weakness and numbness.  All other systems reviewed and are negative.    Physical Exam Updated Vital Signs ED Triage Vitals  Enc Vitals Group     BP 11/12/17 1313 108/76     Pulse Rate 11/12/17 1313 91     Resp 11/12/17 1313 (!) 23     Temp 11/12/17 1313 98.1 F (36.7 C)     Temp Source 11/12/17 1313 Oral     SpO2 11/12/17 1313 98 %     Weight 11/12/17 1308 167 lb 4.8 oz (75.9 kg)     Height 11/12/17 1308 6' (1.829 m)     Head Circumference --      Peak Flow --      Pain Score 11/12/17 1308 0     Pain Loc --      Pain Edu? --      Excl. in [place]? --     Physical Exam  Constitutional: He is oriented to person, place, and time. He appears well-developed and well-nourished. He appears distressed.  HENT:  Head: Normocephalic and atraumatic.  Eyes: Pupils are equal, round, and reactive to light. Conjunctivae and EOM are normal.  Neck: Normal range of  motion. Neck supple.  Cardiovascular: Normal rate, normal heart sounds and intact distal pulses. An irregularly irregular rhythm present.  No murmur heard. Pulmonary/Chest: Effort normal and breath sounds normal. No  respiratory distress.  Abdominal: Soft. Bowel sounds are normal. There is no tenderness.  Musculoskeletal: Normal range of motion. He exhibits no edema.  Neurological: He is alert and oriented to person, place, and time.  Skin: Skin is warm and dry. Capillary refill takes less than 2 seconds.  Psychiatric: He has a normal mood and affect.  Nursing note and vitals reviewed.    ED Treatments / Results  Labs (all labs ordered are listed, but only abnormal results are displayed) Labs Reviewed  BASIC METABOLIC PANEL - Abnormal; Notable for the following components:      Result Value   Glucose, Bld 130 (*)    Calcium 8.7 (*)    All other components within normal limits  CBC - Abnormal; Notable for the following components:   RBC 3.67 (*)    Hemoglobin 10.6 (*)    HCT 34.2 (*)    All other components within normal limits  MAGNESIUM  CBC  BASIC METABOLIC PANEL  RAPID URINE DRUG SCREEN, HOSP PERFORMED  I-STAT TROPONIN, ED    EKG EKG Interpretation  Date/Time:  Monday November 12 2017 13:13:09 EDT Ventricular Rate:  91 PR Interval:    QRS Duration: 89 QT Interval:  336 QTC Calculation: 414 R Axis:   -23 Text Interpretation:  Atrial fibrillation Abnormal R-wave progression, early transition No significant change since last tracing Confirmed by Lennice Sites [phone]) on 11/12/2017 1:17:30 PM Also confirmed by Lennice Sites [phone]), editor Philomena Doheny [phone])  on 11/12/2017 4:57:24 PM   Radiology Dg Chest 2 View  Result Date: 11/12/2017 CLINICAL DATA:  Atrial fibrillation with shortness of breath EXAM: CHEST - 2 VIEW COMPARISON:  11/07/2017 FINDINGS: Normal heart size and mediastinal contours. Chronic interstitial coarsening. There is no edema, consolidation, effusion,  or pneumothorax. Artifact from EKG leads. IMPRESSION: Stable from prior.  No acute finding. Electronically Signed   By: Monte Fantasia M.D.   On: 11/12/2017 13:56    Procedures Procedures (including critical care time)  Medications Ordered in ED Medications  aspirin EC tablet 81 mg (81 mg Oral Not Given 11/12/17 1729)  pravastatin (PRAVACHOL) tablet 40 mg (40 mg Oral Given 11/12/17 1807)  metFORMIN (GLUCOPHAGE) tablet 1,000 mg (1,000 mg Oral Given 11/12/17 1807)  pantoprazole (PROTONIX) EC tablet 40 mg (40 mg Oral Given 11/12/17 1807)  polyethylene glycol (MIRALAX / GLYCOLAX) packet 17 g (has no administration in time range)  tamsulosin (FLOMAX) capsule 0.4 mg (0.4 mg Oral Not Given 11/12/17 1730)  diclofenac sodium (VOLTAREN) 1 % transdermal gel 4 g (has no administration in time range)  acetaminophen (TYLENOL) tablet 650 mg (has no administration in time range)  ondansetron (ZOFRAN) injection 4 mg (has no administration in time range)  enoxaparin (LOVENOX) injection 40 mg (has no administration in time range)  insulin aspart (novoLOG) injection 0-9 Units (has no administration in time range)  diltiazem (CARDIZEM SR) 12 hr capsule 90 mg (has no administration in time range)  sodium chloride 0.9 % bolus 500 mL (0 mLs Intravenous Stopped 11/12/17 1631)     Initial Impression / Assessment and Plan / ED Course  I have reviewed the triage vital signs and the nursing notes.  Pertinent labs & imaging results that were available during my care of the patient were reviewed by me and considered in my medical decision making (see chart for details).     Issa Kosmicki is a 77 year old male with history of CAD, COPD, arthritis, atrial fibrillation who presents to the ED with dizziness, palpitations.  Patient with unremarkable vitals upon arrival.  Patient with EKG that shows atrial fibrillation at 91 bpm.  Patient with normal blood pressure.  No fever.  Patient states that he has had multiple recent  admissions for atrial fibrillation with RVR.  Currently on metoprolol and diltiazem.  Patient previously on flecainide.  Patient had heart rate in the 180s with EMS that improved with IV 5 mg metoprolol.  Patient has had intermittent hypotension that improved with fluids.  Patient had unremarkable lab work.  No significant anemia, electrolyte abnormality, kidney injury.  Troponin within normal limits.  Magnesium normal.  Chest x-ray showed no acute findings.  No pneumothorax, no pneumonia, no pleural effusion.  Patient continues to have some intermittent hypotension but apparently continues to have uncontrolled atrial fibrillation.  He states that over the last 2 days he feels as if he has been in and out of rhythm.  Cardiology was consulted and came down to the ED to evaluate the patient.  They will leave recommendations and patient to be admitted to the family medicine service.  Stable throughout my care.  Final Clinical Impressions(s) / ED Diagnoses   Final diagnoses:  Palpitations  Paroxysmal atrial fibrillation [facility])    ED Discharge Orders    None      Lennice Sites, DO 11/12/17 [place], [place], DO 11/12/17 1914

## 2017-11-12 NOTE — ED Notes (Signed)
Patient transported to X-ray 

## 2017-11-13 ENCOUNTER — Encounter (HOSPITAL_COMMUNITY): Payer: Self-pay | Admitting: *Deleted

## 2017-11-13 ENCOUNTER — Other Ambulatory Visit: Payer: Self-pay

## 2017-11-13 DIAGNOSIS — R002 Palpitations: Secondary | ICD-10-CM

## 2017-11-13 DIAGNOSIS — I455 Other specified heart block: Secondary | ICD-10-CM

## 2017-11-13 LAB — GLUCOSE, CAPILLARY
GLUCOSE-CAPILLARY: 111 mg/dL — AB (ref 70–99)
GLUCOSE-CAPILLARY: 129 mg/dL — AB (ref 70–99)
Glucose-Capillary: 174 mg/dL — ABNORMAL HIGH (ref 70–99)
Glucose-Capillary: 95 mg/dL (ref 70–99)
Glucose-Capillary: 151 mg/dL — ABNORMAL HIGH (ref 70–99)

## 2017-11-13 LAB — BASIC METABOLIC PANEL
Anion gap: 6 (ref 5–15)
BUN: 10 mg/dL (ref 8–23)
CHLORIDE: 108 mmol/L (ref 98–111)
CO2: 25 mmol/L (ref 22–32)
CREATININE: 0.97 mg/dL (ref 0.61–1.24)
Calcium: 8 mg/dL — ABNORMAL LOW (ref 8.9–10.3)
GFR calc Af Amer: >60 mL/min (ref >60)
GFR calc non Af Amer: >60 mL/min (ref >60)
GLUCOSE: 98 mg/dL (ref 70–99)
POTASSIUM: 4 mmol/L (ref 3.5–5.1)
SODIUM: 139 mmol/L (ref 135–145)

## 2017-11-13 LAB — CBC
HEMATOCRIT: 33 % — AB (ref 39.0–52.0)
HEMOGLOBIN: 10.2 g/dL — AB (ref 13.0–17.0)
MCH: 28.8 pg (ref 26.0–34.0)
MCHC: 30.9 g/dL (ref 30.0–36.0)
MCV: 93.2 fL (ref 78.0–100.0)
Platelets: 253 10*3/uL (ref 150–400)
RBC: 3.54 MIL/uL — AB (ref 4.22–5.81)
RDW: 14.7 % (ref 11.5–15.5)
WBC: 6.5 10*3/uL (ref 4.0–10.5)

## 2017-11-13 MED ORDER — DILTIAZEM HCL ER COATED BEADS 240 MG PO CP24
240.0000 mg | ORAL_CAPSULE | Freq: Every day | ORAL | Status: DC
  Administered 2017-11-13: 240 mg via ORAL
  Filled 2017-11-13: qty 1

## 2017-11-13 NOTE — Evaluation (Addendum)
Physical Therapy Evaluation Patient Details Name: [name] MRN: [id number] DOB: [birth year] Today's Date: 11/13/2017   History of Present Illness  Patient is a 77 y/o male presenting to the ED on 11/12/17 with primary complaints of palpitations and dizziness. Of note, recently discharged on 8/17 with similar complaints. Admitted for paroxysmal AFib/flutter with RVR. PMH significant for A-fib(S/P ablation 2012, diltiazem daily), COPD, CAD, diastolic CHF, E7OJ, HTN, HLD, anemia.    Clinical Impression  [name] is a pleasant 77 y/o male admitted with the above listed diagnosis. Patient reports that prior to admission he was Mod I with mobility. Patient today with general min guard assist for mobility with patient limited due to fatigue and SOB. Patient will benefit from HHPT at discharge to ensure safe functional mobility in the home environment. PT to follow acutely.   Of note, patient HR at rest mid 80's, up to 108 with mobility but quickly resolving with seated rest break. Patient reporting SOB and fatigue limiting progressive mobility but with O2 on RA at or above 92%.     Follow Up Recommendations Home health PT    Equipment Recommendations  None recommended by PT    Recommendations for Other Services       Precautions / Restrictions Precautions Precautions: Fall Restrictions Weight Bearing Restrictions: No      Mobility  Bed Mobility Overal bed mobility: Modified Independent             General bed mobility comments: No assistance required.   Transfers Overall transfer level: Needs assistance Equipment used: None Transfers: Sit to/from [facility] Sit to Stand: Supervision;Min guard Stand pivot transfers: Supervision;Min guard       General transfer comment: for safety/immediate standing balance  Ambulation/Gait Ambulation/Gait assistance: Min guard Gait Distance (Feet): 100 Feet Assistive device: Rolling walker (2 wheeled) Gait  Pattern/deviations: Step-through pattern;Decreased stride length;Trunk flexed Gait velocity: decreased   General Gait Details: mild instability noted - no LOB; min guard throughout for safety; HR up to 108 with mobility; reports SOB and fatigue limiting  Stairs            Wheelchair Mobility    Modified Rankin (Stroke Patients Only)       Balance Overall balance assessment: Mild deficits observed, not formally tested                                           Pertinent Vitals/Pain Pain Assessment: No/denies pain    Home Living Family/patient expects to be discharged to:: Private residence Living Arrangements: Children Available Help at Discharge: Family;Available PRN/intermittently Type of Home: Mobile home Home Access: Stairs to enter Entrance Stairs-Rails: Right;Left;Can reach both Entrance Stairs-Number of Steps: 3 Home Layout: One level Home Equipment: Walker - 4 wheels;Cane - single point;Shower seat      Prior Function Level of Independence: Independent with assistive device(s)         Comments: independent ADLs, limited IADLs, mobility      Hand Dominance        Extremity/Trunk Assessment   Upper Extremity Assessment Upper Extremity Assessment: Defer to OT evaluation    Lower Extremity Assessment Lower Extremity Assessment: Generalized weakness    Cervical / Trunk Assessment Cervical / Trunk Assessment: Kyphotic  Communication   Communication: No difficulties  Cognition Arousal/Alertness: Awake/alert Behavior During Therapy: WFL for tasks assessed/performed Overall Cognitive Status: Impaired/Different from baseline Area of  Impairment: Memory;Safety/judgement                     Memory: Decreased short-term memory   Safety/Judgement: Decreased awareness of safety            General Comments      Exercises     Assessment/Plan    PT Assessment Patient needs continued PT services  PT Problem List  Decreased strength;Decreased activity tolerance;Decreased balance;Decreased mobility;Decreased knowledge of use of DME;Decreased safety awareness       PT Treatment Interventions DME instruction;Gait training;Functional mobility training;Therapeutic activities;Stair training;Therapeutic exercise;Balance training;Patient/family education    PT Goals (Current goals can be found in the Care Plan section)  Acute Rehab PT Goals Patient Stated Goal: get stronger and regain mobility PT Goal Formulation: With patient Time For Goal Achievement: 11/20/17 Potential to Achieve Goals: Good    Frequency Min 3X/week   Barriers to discharge        Co-evaluation               AM-PAC PT "6 Clicks" Daily Activity  Outcome Measure Difficulty turning over in bed (including adjusting bedclothes, sheets and blankets)?: A Little Difficulty moving from lying on back to sitting on the side of the bed? : A Little Difficulty sitting down on and standing up from a chair with arms (e.g., wheelchair, bedside commode, etc,.)?: Unable Help needed moving to and from a bed to chair (including a wheelchair)?: A Little Help needed walking in hospital room?: A Little Help needed climbing 3-5 steps with a railing? : A Little 6 Click Score: 16    End of Session Equipment Utilized During Treatment: Gait belt Activity Tolerance: Patient tolerated treatment well;Patient limited by fatigue Patient left: in bed;with call bell/phone within reach Nurse Communication: Mobility status PT Visit Diagnosis: Unsteadiness on feet (R26.81);Other abnormalities of gait and mobility (R26.89);Muscle weakness (generalized) (M62.81)    Time: 1420-1440 PT Time Calculation (min) (ACUTE ONLY): 20 min   Charges:   PT Evaluation $PT Eval Moderate Complexity: 1 Mod         Lanney Gins, PT, DPT 11/13/17 3:27 PM Pager: [phone]

## 2017-11-13 NOTE — Progress Notes (Signed)
Per CCMD patient had 2.94, 2.65 and 3.16 second pause. VS checked, Blood sugar as well. Family medicine teaching servise notified. Will come and check on the patient.  Patient stated he feels like his blood sugar is going to drop.   Will continue to monitor.  Tylynn Braniff, RN

## 2017-11-13 NOTE — Plan of Care (Signed)
  Problem: Clinical Measurements: Goal: Ability to maintain clinical measurements within normal limits will improve Outcome: Progressing   Problem: Clinical Measurements: Goal: Cardiovascular complication will be avoided Outcome: Progressing   

## 2017-11-13 NOTE — Progress Notes (Signed)
Patient states that he feels better.Denies pain.  Will continue to monitor.  Joaquim Tolen, RN

## 2017-11-13 NOTE — Progress Notes (Addendum)
Electrophysiology Rounding Note  Patient Name: [name] Date of Encounter: 11/13/2017  Primary Cardiologist: [name] Electrophysiologist: Curt Bears   Subjective   The patient is doing ok today.  At this time, the patient denies chest pain, shortness of breath, or any new concerns.  Inpatient Medications    Scheduled Meds: . aspirin EC  81 mg Oral Daily  . diltiazem  90 mg Oral Q12H  . enoxaparin (LOVENOX) injection  40 mg Subcutaneous Q24H  . insulin aspart  0-9 Units Subcutaneous TID WC  . metFORMIN  1,000 mg Oral BID WC  . pantoprazole  40 mg Oral BID AC  . pravastatin  40 mg Oral QPM  . tamsulosin  0.4 mg Oral Daily   Continuous Infusions:  PRN Meds: acetaminophen, diclofenac sodium, ondansetron (ZOFRAN) IV, polyethylene glycol   Vital Signs    Vitals:   11/12/17 1915 11/12/17 1955 11/13/17 0200 11/13/17 0500  BP: (!) 100/56 109/67 (!) 91/53   Pulse: (!) 117 94 67   Resp: 19 19 18    Temp:  98 F (36.7 C) 97.8 F (36.6 C)   TempSrc:  Oral Oral   SpO2: 98% 99%    Weight:  76.9 kg  76 kg  Height:  5\' 6"  (1.676 m)      Intake/Output Summary (Last 24 hours) at 11/13/2017 0803 Last data filed at 11/13/2017 0600 Gross per 24 hour  Intake 740 ml  Output 750 ml  Net -10 ml   Filed Weights   11/12/17 1308 11/12/17 1955 11/13/17 0500  Weight: 75.9 kg 76.9 kg 76 kg    Physical Exam    GEN- The patient is well appearing, alert and oriented x 3 today.   Head- normocephalic, atraumatic Eyes-  Sclera clear, conjunctiva pink Ears- hearing intact Oropharynx- clear Neck- supple Lungs- Clear to ausculation bilaterally, normal work of breathing Heart- Irregular rate and rhythm  GI- soft, NT, ND, + BS Extremities- no clubbing, cyanosis, or edema Skin- no rash or lesion Psych- euthymic mood, full affect Neuro- strength and sensation are intact  Labs    CBC Recent Labs    11/12/17 1318 11/13/17 0401  WBC 7.4 6.5  HGB 10.6* 10.2*  HCT 34.2* 33.0*    MCV 93.2 93.2  PLT 277 740   Basic Metabolic Panel Recent Labs    11/12/17 1318 11/12/17 1733 11/13/17 0401  NA 139  --  139  K 4.1  --  4.0  CL 108  --  108  CO2 22  --  25  GLUCOSE 130*  --  98  BUN 10  --  10  CREATININE 0.84  --  0.97  CALCIUM 8.7*  --  8.0*  MG  --  2.0  --      Telemetry    AF (personally reviewed)  Radiology    Dg Chest 2 View  Result Date: 11/12/2017 CLINICAL DATA:  Atrial fibrillation with shortness of breath EXAM: CHEST - 2 VIEW COMPARISON:  11/07/2017 FINDINGS: Normal heart size and mediastinal contours. Chronic interstitial coarsening. There is no edema, consolidation, effusion, or pneumothorax. Artifact from EKG leads. IMPRESSION: Stable from prior.  No acute finding. Electronically Signed   By: Monte Fantasia M.D.   On: 11/12/2017 13:56    Assessment & Plan    1.  Longstanding persistent atrial fibrillation Not a candidate for [place] per prior notes Continue current Diltiazem dosing - he did have some nocturnal pauses last night. Will ambulate today and reassess.  Continue rate  control strategy - he is not a candidate for rhythm control without anticoagulation     For questions or updates, please contact [place] Please consult [email or web address] for contact info under Cardiology/STEMI.  Signed, Chanetta Marshall, NP  11/13/2017, 8:03 AM   I have seen and examined this patient with Chanetta Marshall.  Agree with above, note added to reflect my findings.  On exam, iRRR, no murmurs, lungs clear.  Patient with continued rapid atrial fibrillation.  Would increase his diltiazem to 240 mg daily.  Should he tolerate this and be able to ambulate without issue, would be okay for discharge from the hospital.  He is likely not an anticoagulation candidate as previously he has had multiple GI bleeds with severe anemia.  He would be a rate control strategy.  Will M. Camnitz MD 11/13/2017 8:16 AM

## 2017-11-13 NOTE — Progress Notes (Signed)
Family Medicine Teaching Service Daily Progress Note Intern Pager: [phone]  Patient name: [name] Medical record number: [id number] Date of birth: [birth year] Age: 77 y.o. Gender: male  Primary Care Provider: [name], MD  Code Status: full Consultations: electrophysiology Admission date: 8/19  Pt Overview and Major Events to Date:  Readmit 8/19 for uncontrolled Afib  Subjective: Overnight: no acute events Today: patient feels better and denies any further symptoms of Afib present on admission. He states that he is having lumbar back pain that is same as his chronic pain and would take oxycodone at home but denies taking it since last discharge. He states that his daughter can bring in his medication for [place] to review on her way to work. He has a follow up cardiology appointment for Sept 17  Objective: Temp:  [97.8 F (36.6 C)-98.4 F (36.9 C)] 98.4 F (36.9 C) (08/20 1158) Pulse Rate:  [67-117] 78 (08/20 1158) Resp:  [17-26] 19 (08/20 1158) BP: (91-129)/(50-77) 106/58 (08/20 1158) SpO2:  [97 %-100 %] 98 % (08/20 1158) Weight:  [76 kg-76.9 kg] 76 kg (08/20 0500)  Physical Exam: General: NAD, pleasant. Sitting up in bed Cardiovascular: RRR, no murmer appreciated Respiratory: CTAB, no increased WOB Abdomen: soft, no masses, non-distended, non-tender Extremities: well-perfused, all moving normally, no swelling  Assessment and Plan:  Paroxysmal Afib/flutter with RVR- Patient was discharged on 8/17 with similar chief complaint and continued to be symptomatic of Afib RVR with tachycardia on home monitoring. In the hospital, his diltiazam was increased to 90 BID which resolved his symptoms and improved his heart rate. Today, electrophysiology further increased his dose to 240 once daily extended release. Patient is not anticoagulated due to contraindication.Vital signs normal today- 129/69, HR 98, O2 sats 98%ORA.[name] BMP showed normal electrolytes with glucose 98,  calcium 8.0, creatinine 0.97, GFR >60. CBC showed WBC 6.5, RBC 3.54, Hgb 10.2. I-Stat troponin was 0.01 Chest Xray showed no acute findings, stable from prior admission. EKG today showed regular rate and rhythm. CHA2DS2-VASc score 6. Etiology of persistent Afib/flutter likely due to poor medical management with unknown if patient is taking meds correctly with evidence of poor understanding on multiple medications.  -continue telemetry -follow electrophysiology recs             -increase diltiazem dose to 240mg  extended release once daily             -discontinue metoprolol             -continue to hold anticoagulation - repeat EKG am  CAD/HFrEF- no evidence of fluid overload on exam. Left heart cath 7/12 showed single vessel CAD involving the ostium of the first septal perforator, otherwise nonobstructive. Normal LVEDP. No intervention. ECHO on 7/12 showed normal LV size with normal systolic function and eEF 55-60%.  - monitor I&Os -follow cards recs  T2DM A1c 5.6% on last admission. Glucose this morning is 111. - continue metformin 1000mg  BID - monitor CBGs daily   COPD- chronic, stable. resolved SOB and O2 sats 100 ORA. -continue home meds - monitor  Iron Deficiency Anemia- Hgb 10.2, MCV 93.2 today. Ferritin on 8/17 was 17. patient takes multivitamin/ mineral BID at home - continue to monitor with daily CBC - consider adding iron supplement if repeat Hgb decreases  Chronic Back Pain- patient states he has chronic mild lower back pain on admission unchanged from previous pain. Has diclofenac gel at home - continue diclofenac gel PRN  BPH urine output overnight was ~750 Stable on tamsulosin  at home. - continue tamsulosin - monitor I&Os  FEN/GI: regular diet PPx: lovenox  Disposition: telemetry   Laboratory: Recent Labs  Lab 11/10/17 0444 11/12/17 1318 11/13/17 0401  WBC 8.4 7.4 6.5  HGB 10.9* 10.6* 10.2*  HCT 35.2* 34.2* 33.0*  PLT 244 277 253   Recent Labs   Lab 11/09/17 0626 11/12/17 1318 11/13/17 0401  NA 139 139 139  K 3.9 4.1 4.0  CL 106 108 108  CO2 24 22 25   BUN 11 10 10   CREATININE 0.99 0.84 0.97  CALCIUM 7.9* 8.7* 8.0*  GLUCOSE 158* 130* 98     Imaging/Diagnostic Tests: EKG today showed normal sinus rhythm   Richarda Osmond, DO 11/13/2017, 1:12 PM PGY-1, [place] Intern pager: [phone], text pages welcome

## 2017-11-14 ENCOUNTER — Other Ambulatory Visit: Payer: Self-pay

## 2017-11-14 LAB — GLUCOSE, CAPILLARY
GLUCOSE-CAPILLARY: 138 mg/dL — AB (ref 70–99)
GLUCOSE-CAPILLARY: 210 mg/dL — AB (ref 70–99)
GLUCOSE-CAPILLARY: 181 mg/dL — AB (ref 70–99)
GLUCOSE-CAPILLARY: 153 mg/dL — AB (ref 70–99)

## 2017-11-14 MED ORDER — DILTIAZEM HCL ER COATED BEADS 180 MG PO CP24
180.0000 mg | ORAL_CAPSULE | Freq: Every day | ORAL | Status: DC
  Administered 2017-11-14 – 2017-11-16 (×3): 180 mg via ORAL
  Filled 2017-11-14 (×3): qty 1

## 2017-11-14 NOTE — Progress Notes (Signed)
Electrophysiology Rounding Note  Patient Name: [name] Date of Encounter: 11/14/2017  Primary Cardiologist: [name] Electrophysiologist: Curt Bears   Subjective   Doing well today.  No chest pain or shortness of breath.  He did have an episode of weakness yesterday.  He is currently tolerating his diltiazem without issue.  We have decreased the dose though due to up to 3.2-second pauses.  He was asymptomatic during these pauses.  Inpatient Medications    Scheduled Meds: . aspirin EC  81 mg Oral Daily  . diltiazem  180 mg Oral Daily  . enoxaparin (LOVENOX) injection  40 mg Subcutaneous Q24H  . insulin aspart  0-9 Units Subcutaneous TID WC  . metFORMIN  1,000 mg Oral BID WC  . pantoprazole  40 mg Oral BID AC  . pravastatin  40 mg Oral QPM  . tamsulosin  0.4 mg Oral Daily   Continuous Infusions:  PRN Meds: acetaminophen, diclofenac sodium, ondansetron (ZOFRAN) IV, polyethylene glycol   Vital Signs    Vitals:   11/13/17 1932 11/14/17 0030 11/14/17 0433 11/14/17 0920  BP: (!) 102/58 103/63 99/62 136/76  Pulse: (!) 56 81 82 65  Resp: 20 18 18 18   Temp: 97.8 F (36.6 C) 97.6 F (36.4 C) (!) 97.5 F (36.4 C) 98 F (36.7 C)  TempSrc: Oral Oral Oral Oral  SpO2: 99% 98% 98% 96%  Weight:   75.8 kg   Height:        Intake/Output Summary (Last 24 hours) at 11/14/2017 1039 Last data filed at 11/14/2017 1950 Gross per 24 hour  Intake 1742 ml  Output 1850 ml  Net -108 ml   Filed Weights   11/12/17 1955 11/13/17 0500 11/14/17 0433  Weight: 76.9 kg 76 kg 75.8 kg    Physical Exam    GEN: Well nourished, well developed, in no acute distress  HEENT: normal  Neck: no JVD, carotid bruits, or masses Cardiac: iRRR; no murmurs, rubs, or gallops,no edema  Respiratory:  clear to auscultation bilaterally, normal work of breathing GI: soft, nontender, nondistended, + BS MS: no deformity or atrophy  Skin: warm and dry Neuro:  Strength and sensation are intact Psych:  euthymic mood, full affect   Labs    CBC Recent Labs    11/12/17 1318 11/13/17 0401  WBC 7.4 6.5  HGB 10.6* 10.2*  HCT 34.2* 33.0*  MCV 93.2 93.2  PLT 277 932   Basic Metabolic Panel Recent Labs    11/12/17 1318 11/12/17 1733 11/13/17 0401  NA 139  --  139  K 4.1  --  4.0  CL 108  --  108  CO2 22  --  25  GLUCOSE 130*  --  98  BUN 10  --  10  CREATININE 0.84  --  0.97  CALCIUM 8.7*  --  8.0*  MG  --  2.0  --      Telemetry    Atrial fibrillation, personally reviewed  Radiology    Dg Chest 2 View  Result Date: 11/12/2017 CLINICAL DATA:  Atrial fibrillation with shortness of breath EXAM: CHEST - 2 VIEW COMPARISON:  11/07/2017 FINDINGS: Normal heart size and mediastinal contours. Chronic interstitial coarsening. There is no edema, consolidation, effusion, or pneumothorax. Artifact from EKG leads. IMPRESSION: Stable from prior.  No acute finding. Electronically Signed   By: Monte Fantasia M.D.   On: 11/12/2017 13:56    Assessment & Plan    1.  Longstanding persistent atrial fibrillation Not a candidate for anticoagulation.  He is currently on diltiazem.  He did have up to a 3.2-second pause on his 240 mg dose.  We Micheil Klaus decrease the dose to 180 mg.  Should his heart rate remain well controlled, he would likely be able to be discharged without issue.  Due to his pause, if he does have continued tachycardia, he may benefit from pacemaker implant in the future.  This can also be evaluated as an outpatient.     For questions or updates, please contact [place] Please consult [email or web address] for contact info under Cardiology/STEMI.  Signed, Makina Skow Meredith Leeds, MD  11/14/2017, 10:39 AM

## 2017-11-14 NOTE — Evaluation (Signed)
Occupational Therapy Evaluation Patient Details Name: [name] MRN: [id number] DOB: 1940/12/30 Today's Date: 11/14/2017    History of Present Illness Patient is a 77 y/o male presenting to the ED on 11/12/17 with primary complaints of palpitations and dizziness. Of note, recently discharged on 8/17 with similar complaints. Admitted for paroxysmal AFib/flutter with RVR. PMH significant for A-fib(S/P ablation 2012, diltiazem daily), COPD, CAD, diastolic CHF, W0JW, HTN, HLD, anemia.   Clinical Impression   PTA, pt was living with her daughter and son-in-law and was performing BADLs and light IADLs. Pt reporting that recently he has decreased his participation in IADLs due to fatigue. Pt currently performing ADLs and functional mobility at supervision level. Pt presenting with decreased activity tolerance and reporting fatigue with home distance functional mobility. HR elevating to 116 during mobility and returning to 90s with standing rest break. SpO2 96% on RA. Pt would benefit from further acute OT to facilitate safe dc. Recommend dc to home with HHOT for further OT to optimize safety, independence with ADLs, and return to PLOF.      Follow Up Recommendations  Home health OT;Supervision/Assistance - 24 hour    Equipment Recommendations  None recommended by OT    Recommendations for Other Services PT consult     Precautions / Restrictions Precautions Precautions: Fall Restrictions Weight Bearing Restrictions: No      Mobility Bed Mobility               General bed mobility comments: At EOB upon arrival  Transfers Overall transfer level: Needs assistance Equipment used: None Transfers: Sit to/from Stand Sit to Stand: Supervision         General transfer comment: supervision for safety    Balance Overall balance assessment: Mild deficits observed, not formally tested                                         ADL either performed or assessed  with clinical judgement   ADL Overall ADL's : Needs assistance/impaired Eating/Feeding: Independent   Grooming: Set up;Supervision/safety;Standing   Upper Body Bathing: Set up;Supervision/ safety;Sitting   Lower Body Bathing: Set up;Supervison/ safety;Sit to/from stand   Upper Body Dressing : Set up;Supervision/safety;Sitting   Lower Body Dressing: Set up;Supervision/safety;Sit to/from stand Lower Body Dressing Details (indicate cue type and reason): Pt able to bring ankles to knees for adjusting socks. SUpervision for safety. Toilet Transfer: Set up;Supervision/safety;Ambulation;RW(Simulated to recliner)         Tub/Shower Transfer Details (indicate cue type and reason): Discussed safe tub transfers. Pt reporting he uses hand rails and steps in side ways.  Functional mobility during ADLs: Supervision/safety;Rolling walker General ADL Comments: Pt performing funcitonal mobility in hallway. HR elevating to 116 during activity. Returns to 90s at rest. SpO2 96% on RA.     Vision Baseline Vision/History: Wears glasses Wears Glasses: Reading only Patient Visual Report: No change from baseline Additional Comments: Pt reporting he should wear glasses but he doesn't.      Perception     Praxis      Pertinent Vitals/Pain Pain Assessment: 0-10 Pain Score: 5  Pain Location: chronic back pain Pain Descriptors / Indicators: Aching Pain Intervention(s): Monitored during session;Repositioned     Hand Dominance Right   Extremity/Trunk Assessment Upper Extremity Assessment Upper Extremity Assessment: Generalized weakness   Lower Extremity Assessment Lower Extremity Assessment: Defer to PT evaluation   Cervical / Trunk  Assessment Cervical / Trunk Assessment: Kyphotic;Other exceptions Cervical / Trunk Exceptions: Lower back pain   Communication Communication Communication: No difficulties   Cognition Arousal/Alertness: Awake/alert Behavior During Therapy: WFL for tasks  assessed/performed Overall Cognitive Status: No family/caregiver present to determine baseline cognitive functioning Area of Impairment: Memory;Safety/judgement                     Memory: Decreased short-term memory   Safety/Judgement: Decreased awareness of safety     General Comments: Pt moves quickly and presents with deceased safety awareness. Feel pt is close to baseline cognition and no family present to confirm.   General Comments  HR elevating to 116. SpO2 96% on RA    Exercises     Shoulder Instructions      Home Living Family/patient expects to be discharged to:: Private residence Living Arrangements: Children Available Help at Discharge: Family;Available PRN/intermittently Type of Home: Mobile home Home Access: Stairs to enter Entrance Stairs-Number of Steps: 3 Entrance Stairs-Rails: Right;Left;Can reach both Home Layout: One level     Bathroom Shower/Tub: Teacher, early years/pre: Handicapped height     Home Equipment: Environmental consultant - 4 wheels;Cane - single point;Shower seat          Prior Functioning/Environment Level of Independence: Independent with assistive device(s)        Comments: ADLs and functional with walker as needed. Light IADLs. Pt reporting he hasn't been doing IADLs lately due to fatigue. Does not drive        OT Problem List: Decreased strength;Decreased activity tolerance;Impaired balance (sitting and/or standing);Decreased cognition;Decreased safety awareness;Decreased knowledge of precautions      OT Treatment/Interventions: Self-care/ADL training;Therapeutic exercise;Balance training;Patient/family education;Therapeutic activities    OT Goals(Current goals can be found in the care plan section) Acute Rehab OT Goals Patient Stated Goal: get stronger and regain mobility OT Goal Formulation: With patient Time For Goal Achievement: 11/28/17 Potential to Achieve Goals: Good ADL Goals Pt Will Perform Grooming: with  modified independence;standing Pt Will Perform Lower Body Dressing: with modified independence;sit to/from stand Pt Will Perform Tub/Shower Transfer: Tub transfer;with modified independence;ambulating;shower seat Additional ADL Goal #1: Pt will verablize uinderstanding of three energy conservation techniques with 1-2 VCs  OT Frequency: Min 2X/week   Barriers to D/C:            Co-evaluation              AM-PAC PT "6 Clicks" Daily Activity     Outcome Measure Help from another person eating meals?: None Help from another person taking care of personal grooming?: None Help from another person toileting, which includes using toliet, bedpan, or urinal?: None Help from another person bathing (including washing, rinsing, drying)?: None Help from another person to put on and taking off regular upper body clothing?: None Help from another person to put on and taking off regular lower body clothing?: None 6 Click Score: 24   End of Session Equipment Utilized During Treatment: Rolling walker Nurse Communication: Mobility status  Activity Tolerance: Patient tolerated treatment well Patient left: in chair;with call bell/phone within reach  OT Visit Diagnosis: Unsteadiness on feet (R26.81);Muscle weakness (generalized) (M62.81)                Time: 1017-5102 OT Time Calculation (min): 9 min Charges:  OT General Charges $OT Visit: 1 Visit OT Evaluation $OT Eval Low Complexity: [place], OTR/L Acute Rehab Pager: [phone] Office: [place] 11/14/2017, 8:23  AM

## 2017-11-14 NOTE — Progress Notes (Signed)
Family Medicine Teaching Service Daily Progress Note Intern Pager: [phone]  Patient name: [name] Medical record number: [id number] Date of birth: Aug 05, 1940 Age: 77 y.o. Gender: male  Primary Care Provider: [name], MD  Code Status: full Consultations: electrophysiology Admission date: 8/19  Pt Overview and Major Events to Date:  Readmit 8/19 for uncontrolled Afib  Subjective: Overnight: no acute events Today: patient feels better and denies any further symptoms of Afib present on admission. He would like to go home. No concerns today. He has a follow up cardiology appointment for Sept 17  Objective: Temp:  [97.5 F (36.4 C)-98.2 F (36.8 C)] 98.2 F (36.8 C) (08/21 1325) Pulse Rate:  [56-87] 87 (08/21 1325) Resp:  [18-20] 19 (08/21 1325) BP: (93-136)/(53-76) 110/61 (08/21 1325) SpO2:  [96 %-100 %] 100 % (08/21 1325) Weight:  [75.8 kg] 75.8 kg (08/21 0433)  Physical Exam: General: NAD, pleasant. Sitting up in bed Cardiovascular: irregular rhythm, regular rate, no murmer appreciated Respiratory: moderate rhonchi that improved with forced coughing, no increased WOB Abdomen: soft, no masses, non-distended, non-tender Extremities: well-perfused, all moving normally, no swelling  Assessment and Plan:  Paroxysmal Afib/flutter with RVR- Patient was discharged on 8/17 with similar chief complaint and continued to be symptomatic of Afib RVR with tachycardia on home monitoring.Today, electrophysiology further changed his dosage to 180mg  diltiazem once daily extended release. Vital signs normal today. However, he had a 3 second pause on telemetry overnight and a few lower HR readings. -continue telemetry overnight -follow electrophysiology recs             -change diltiazem dose from 240mg  extended release once daily to 180mg  extended release daily             -discontinued metoprolol             -lovenox prophylaxis on board - repeat EKG am  CAD/HFrEF- no  evidence of fluid overload on exam. Left heart cath 7/12 showed single vessel CAD involving the ostium of the first septal perforator, otherwise nonobstructive. Normal LVEDP. No intervention. ECHO on 7/12 showed normal LV size with normal systolic function and eEF 55-60%.  - monitor I&Os -follow cards recs  T2DM A1c 5.6% on last admission. Glucose this morning is 138 given 1 unit insulin. - continue metformin 1000mg  BID -sSSi - monitor CBGs daily   COPD- chronic, stable. resolved SOB and O2 sats 100 ORA. -continue home meds - monitor  Iron Deficiency Anemia- Hgb 10.2, MCV 93.2 yesterday. Ferritin on 8/17 was 17. patient takes multivitamin/ mineral BID at home - continue to monitor with daily CBC - consider adding iron supplement if repeat Hgb decreases  Chronic Back Pain- patient states he has chronic mild lower back pain on admission unchanged from previous pain. Has diclofenac gel at home - continue diclofenac gel PRN  BPH urine output overnight was ~300 Stable on tamsulosin at home. - continue tamsulosin - monitor I&Os  FEN/GI: regular diet PPx: lovenox  Disposition: telemetry   Laboratory: Recent Labs  Lab 11/10/17 0444 11/12/17 1318 11/13/17 0401  WBC 8.4 7.4 6.5  HGB 10.9* 10.6* 10.2*  HCT 35.2* 34.2* 33.0*  PLT 244 277 253   Recent Labs  Lab 11/09/17 0626 11/12/17 1318 11/13/17 0401  NA 139 139 139  K 3.9 4.1 4.0  CL 106 108 108  CO2 24 22 25   BUN 11 10 10   CREATININE 0.99 0.84 0.97  CALCIUM 7.9* 8.7* 8.0*  GLUCOSE 158* 130* 98     Imaging/Diagnostic  Tests: none   Richarda Osmond, DO 11/14/2017, 2:17 PM PGY-1, [place] Intern pager: [phone], text pages welcome

## 2017-11-14 NOTE — Progress Notes (Signed)
Patient refused bed alarm. Will continue to monitor patient. 

## 2017-11-14 NOTE — Discharge Summary (Signed)
[facility] Discharge Summary  Patient name: [name] Medical record number: [id number] Date of birth: February 19, 1941 Age: 77 y.o. Gender: male Date of Admission: 11/12/2017  Date of Discharge: 8/21 Admitting Physician: Martyn Malay, MD  Primary Care Provider: [name], MD Consultations: electrophysiology  Indication for Hospitalization: continued symptomatic Afib with RVR  Discharge Diagnoses/Problem List:  Paroxysmal Afib with RVR CAD/HFrEF T2DM COPD Iron deficiency Anemia Chronic back pain BPH  Disposition: discharge to home  Discharge Condition: stable  Discharge Exam:  General: pleasant, cooperative Heart: regular rate and rhythm, no murmer  Lungs: CTAB, no increased WOB Abdomen: no tenderness to palpation, soft, obese Extremities: no swelling or injuries Back: no lesions Psych: alert and oriented x3  Brief Hospital Course:  Patient admitted 8/19 for continued Afib RVR and symptomatic with palpitations. Patient states he was compliant with his medication at home. On admission he had Afib with RVR on EKG and monitoring. Electrophysiology consulted and increased his diltiazem to 90mg  BID. This improved his symptoms and decreased the occurrence of rate increases. 8/20 electrophysiology increased dose to 240mg  diltiazem daily extended release. Patient had no more elevated heart rate including while ambulating with PT but did have pauses on telemetry overnight up to 3 seconds. 8/21 electrophysiology changed patient's dose to 180mg  diltiazem daily extended release and cleared for discharge We held for monitoring another day and electrophysiology added amiodarone 400mg  BID loading dose. He has an appointment with his cardiologist on Sept 17, and a follow up appointment sooner with his PCP. We are discharging him today with a prescription of 180mg  diltiazem daily extended release, amiodorone 400mg  BID and melatonin.  Issues for Follow Up:   1. Follow up with PCP for monitoring of Afib and continued prescription of diltiazem 1. Loading dose of amiodarone is 400mg  BID starting 8/22 for two weeks. Followed by 2 weeks of 400mg  daily. Followed by 200mg  daily.  2. If this regimen does not maintain sinus rhythm and prevent further rapid atrial fibrillation he would benefit from pacemaker implant  2. Follow up with cardiologist for continued adjustments as needed for medication management and possible pacemaker in the future if not medically managed  Significant Procedures: none  Significant Labs and Imaging:  Recent Labs  Lab 11/10/17 0444 11/12/17 1318 11/13/17 0401  WBC 8.4 7.4 6.5  HGB 10.9* 10.6* 10.2*  HCT 35.2* 34.2* 33.0*  PLT 244 277 253   Recent Labs  Lab 11/12/17 1318 11/12/17 1733 11/13/17 0401  NA 139  --  139  K 4.1  --  4.0  CL 108  --  108  CO2 22  --  25  GLUCOSE 130*  --  98  BUN 10  --  10  CREATININE 0.84  --  0.97  CALCIUM 8.7*  --  8.0*  MG  --  2.0  --     Results/Tests Pending at Time of Discharge: none  Discharge Medications:  Allergies as of 11/16/2017      Reactions   Cyclobenzaprine Other (See Comments)   Unsteady gait      Medication List    STOP taking these medications   metoprolol succinate 25 MG 24 hr tablet Commonly known as:  TOPROL-XL     TAKE these medications   acetaminophen 325 MG tablet Commonly known as:  TYLENOL Take 325 mg by mouth every 6 (six) hours as needed for mild pain.   albuterol 108 (90 Base) MCG/ACT inhaler Commonly known as:  PROVENTIL HFA;VENTOLIN  HFA Inhale 2 puffs into the lungs every 4 (four) hours as needed for wheezing or shortness of breath.   amiodarone 400 MG tablet Commonly known as:  PACERONE Take 1 tablet (400 mg total) by mouth 2 (two) times daily for 13 days.   amiodarone 400 MG tablet Commonly known as:  PACERONE Take 1 tablet (400 mg total) by mouth daily for 14 days. Start taking on:  11/30/2017   aspirin EC 81 MG  tablet Take 81 mg by mouth daily.   co-enzyme Q-10 30 MG capsule Take 30 mg by mouth daily.   diclofenac sodium 1 % Gel Commonly known as:  VOLTAREN Apply 4 g topically 3 (three) times daily as needed (for back pain).   diltiazem 180 MG 24 hr capsule Commonly known as:  CARDIZEM CD Take 1 capsule (180 mg total) by mouth daily. Start taking on:  11/17/2017 What changed:    medication strength  how much to take   hydrocortisone cream 1 % Apply 1 application topically 2 (two) times daily.   Melatonin 3 MG Tabs Take 1 tablet (3 mg total) by mouth at bedtime.   metFORMIN 1000 MG tablet Commonly known as:  GLUCOPHAGE Take 1,000 mg by mouth 2 (two) times daily with a meal.   multivitamin with minerals Tabs tablet Take 1 tablet by mouth 2 (two) times daily.   nitroGLYCERIN 0.4 MG SL tablet Commonly known as:  NITROSTAT Place 1 tablet (0.4 mg total) under the tongue every 5 (five) minutes x 3 doses as needed for chest pain.   pantoprazole 40 MG tablet Commonly known as:  PROTONIX Take 1 tablet (40 mg total) by mouth 2 (two) times daily before a meal.   polyethylene glycol packet Commonly known as:  MIRALAX / GLYCOLAX Take 17 g by mouth as needed for moderate constipation.   pravastatin 40 MG tablet Commonly known as:  PRAVACHOL Take 1 tablet (40 mg total) by mouth every evening.   tamsulosin 0.4 MG Caps capsule Commonly known as:  FLOMAX Take 0.4 mg by mouth daily.       Discharge Instructions: Please refer to Patient Instructions section of EMR for full details.  Patient was counseled important signs and symptoms that should prompt return to medical care, changes in medications, dietary instructions, activity restrictions, and follow up appointments.   Follow-Up Appointments: Follow-up Information    [name], MD. Go on 11/30/2017.   Specialty:  Family Medicine Why:  @9 :30am Contact information: [place]  [id number] [phone]           Richarda Osmond, DO 11/14/2017, 9:40 AM PGY-1, [place]

## 2017-11-14 NOTE — Progress Notes (Signed)
The documentation by Valetta Mole, nursing student at [facility], is accurate to the best of my knowledge.

## 2017-11-15 DIAGNOSIS — I4891 Unspecified atrial fibrillation: Secondary | ICD-10-CM

## 2017-11-15 LAB — GLUCOSE, CAPILLARY
GLUCOSE-CAPILLARY: 102 mg/dL — AB (ref 70–99)
Glucose-Capillary: 135 mg/dL — ABNORMAL HIGH (ref 70–99)
Glucose-Capillary: 258 mg/dL — ABNORMAL HIGH (ref 70–99)
Glucose-Capillary: 122 mg/dL — ABNORMAL HIGH (ref 70–99)

## 2017-11-15 MED ORDER — MELATONIN 3 MG PO TABS
3.0000 mg | ORAL_TABLET | Freq: Every day | ORAL | Status: DC
  Administered 2017-11-15: 3 mg via ORAL
  Filled 2017-11-15 (×2): qty 1

## 2017-11-15 MED ORDER — NON FORMULARY: 3.0000 mg | Freq: Every day | Status: DC

## 2017-11-15 MED ORDER — AMIODARONE HCL 200 MG PO TABS
400.0000 mg | ORAL_TABLET | Freq: Two times a day (BID) | ORAL | Status: DC
  Administered 2017-11-15 – 2017-11-16 (×3): 400 mg via ORAL
  Filled 2017-11-15 (×3): qty 2

## 2017-11-15 NOTE — Progress Notes (Signed)
Physical Therapy Treatment Patient Details Name: [name] MRN: [id number] DOB: November 22, 1940 Today's Date: 11/15/2017    History of Present Illness Patient is a 77 y/o male presenting to the ED on 11/12/17 with primary complaints of palpitations and dizziness. Of note, recently discharged on 8/17 with similar complaints. Admitted for paroxysmal AFib/flutter with RVR. PMH significant for A-fib(S/P ablation 2012, diltiazem daily), COPD, CAD, diastolic CHF, V7OH, HTN, HLD, anemia.    PT Comments    Patient doing well with therapy today. HR resting mid 90's at entry. Remained at 100-110 throughout 150' of ambulation this visit. Pt still with DOE 3/4 during visit, SpO2 WNL on RA throughout. Still agree with HHPT recs once medically stable for d/c.     Follow Up Recommendations  Home health PT     Equipment Recommendations  None recommended by PT    Recommendations for Other Services       Precautions / Restrictions Precautions Precautions: Fall Precaution Comments: monitor HR Restrictions Weight Bearing Restrictions: No    Mobility  Bed Mobility               General bed mobility comments: At EOB upon arrival  Transfers Overall transfer level: Independent Equipment used: Rolling walker (2 wheeled) Transfers: Sit to/from Stand Sit to Stand: Supervision Stand pivot transfers: Supervision;Min guard       General transfer comment: supervision for safety  Ambulation/Gait Ambulation/Gait assistance: Min guard Gait Distance (Feet): 170 Feet Assistive device: Rolling walker (2 wheeled) Gait Pattern/deviations: Step-through pattern;Decreased stride length;Trunk flexed Gait velocity: decreased   General Gait Details: min guard, supervision level. no LOB, pt becomes DOE 3/4, Spo2 WNL on RA, HRmax 116   Stairs             Wheelchair Mobility    Modified Rankin (Stroke Patients Only)       Balance Overall balance assessment: Mild deficits observed, not  formally tested                                          Cognition Arousal/Alertness: Awake/alert Behavior During Therapy: WFL for tasks assessed/performed Overall Cognitive Status: Within Functional Limits for tasks assessed                                        Exercises      General Comments        Pertinent Vitals/Pain Pain Assessment: No/denies pain    Home Living                      Prior Function            PT Goals (current goals can now be found in the care plan section) Acute Rehab PT Goals PT Goal Formulation: With patient Time For Goal Achievement: 11/20/17 Potential to Achieve Goals: Good Progress towards PT goals: Progressing toward goals    Frequency    Min 3X/week      PT Plan Current plan remains appropriate    Co-evaluation              AM-PAC PT "6 Clicks" Daily Activity  Outcome Measure  Difficulty turning over in bed (including adjusting bedclothes, sheets and blankets)?: A Little Difficulty moving from lying on back to sitting on the side of the  bed? : A Little Difficulty sitting down on and standing up from a chair with arms (e.g., wheelchair, bedside commode, etc,.)?: Unable Help needed moving to and from a bed to chair (including a wheelchair)?: A Little Help needed walking in hospital room?: A Little Help needed climbing 3-5 steps with a railing? : A Little 6 Click Score: 16    End of Session Equipment Utilized During Treatment: Gait belt Activity Tolerance: Patient tolerated treatment well;Patient limited by fatigue Patient left: in bed;with call bell/phone within reach Nurse Communication: Mobility status PT Visit Diagnosis: Unsteadiness on feet (R26.81);Other abnormalities of gait and mobility (R26.89);Muscle weakness (generalized) (M62.81)     Time: 2122-4825 PT Time Calculation (min) (ACUTE ONLY): 20 min  Charges:  $Gait Training: 8-22 mins                     Reinaldo Berber, PT, DPT Acute Rehab Services Pager: [phone]     Reinaldo Berber 11/15/2017, 3:43 PM

## 2017-11-15 NOTE — Progress Notes (Signed)
Family Medicine Teaching Service Daily Progress Note Intern Pager: [phone]  Patient name: [name] Medical record number: [id number] Date of birth: 06-29-1940 Age: 77 y.o. Gender: male  Primary Care Provider: [name], MD  Code Status: full Consultations: electrophysiology Admission date: 8/19  Pt Overview and Major Events to Date:  Readmit 8/19 for uncontrolled Afib  Subjective: Overnight: no acute events Today: patient feels better and denies any further symptoms of Afib present on admission. He states that he wants to get everything figured out here and is open to getting a pace maker if he needs one because he doesn't want to come back but he has been very satisfied with his care here and trusts his doctors. He describes having difficulty sleeping which is new onset since admission. He is restless at night and would like a sedating medication to help him sleep. He has no other concerns at this time. He has a follow up cardiology appointment for Sept 17  Objective: Blood pressure 117/71, pulse 85, temperature 97.6 F (36.4 C), temperature source Oral, resp. rate 18, height 5\' 6"  (1.676 m), weight 75.1 kg, SpO2 97 %.  Physical Exam: General: NAD, pleasant. Laying in bed Cardiovascular: irregular rhythm, regular rate, no murmer appreciated Respiratory: CTAB, no increased WOB Abdomen: soft, no masses, non-distended, non-tender Extremities: well-perfused, all moving normally, no swelling  Assessment and Plan:  Paroxysmal Afib/flutter with RVR- Patient was discharged on 8/17 with similar chief complaint and continued to be symptomatic of Afib RVR with tachycardia on home monitoring.Electrophysiology further changed his dosage to 180mg  diltiazem once daily extended release yesterday. He had a 3 second pause on telemetry the night before with higher dose. He had no acute events over night last night on new dose. Added amiodorone. If he continues to have rapid atrial  fibrillation, he would likely benefit from pacemaker implant for tachybradycardia syndrome. Vital signs normal today. Per electrophysiology: Should he convert to sinus rhythm with amiodarone or have improved rate control, would be able to be discharged later this afternoon on 400 mg of amiodarone twice a day for 2 weeks followed by 400 mg of amiodarone daily for 2 weeks followed by 200 mg of amiodarone daily. -continue telemetry -follow electrophysiology recs             -continue diltiazem 180mg  extended release daily  -added amiodarone 400mg  BID             -discontinued metoprolol             -lovenox prophylaxis on board  -possible pacemaker placement - repeat EKG  CAD/HFrEF- no evidence of fluid overload on exam. Left heart cath 7/12 showed single vessel CAD involving the ostium of the first septal perforator, otherwise nonobstructive. Normal LVEDP. No intervention. ECHO on 7/12 showed normal LV size with normal systolic function and eEF 55-60%.  - monitor I&Os -follow cards recs -added amiodarone   T2DM A1c 5.6% on last admission. Glucose this morning is 135 - continue metformin 1000mg  BID -sSSi - monitor CBGs daily   COPD- chronic, stable. resolved SOB and O2 sats 100 ORA. -continue home meds - monitor  Iron Deficiency Anemia- Hgb 10.2, MCV 93.2 8/20. Ferritin on 8/17 was 17. patient takes multivitamin/ mineral BID at home - continue to monitor with daily CBC - consider adding iron supplement if repeat Hgb decreases  Chronic Back Pain- patient states he has chronic mild lower back pain on admission unchanged from previous pain. Has diclofenac gel at home - continue diclofenac  gel PRN  BPH urine output overnight was ~300 Stable on tamsulosin at home. - continue tamsulosin - monitor I&Os  FEN/GI: regular diet PPx: lovenox  Disposition: telemetry   Laboratory: Recent Labs  Lab 11/10/17 0444 11/12/17 1318 11/13/17 0401  WBC 8.4 7.4 6.5  HGB 10.9* 10.6*  10.2*  HCT 35.2* 34.2* 33.0*  PLT 244 277 253   Recent Labs  Lab 11/09/17 0626 11/12/17 1318 11/13/17 0401  NA 139 139 139  K 3.9 4.1 4.0  CL 106 108 108  CO2 24 22 25   BUN 11 10 10   CREATININE 0.99 0.84 0.97  CALCIUM 7.9* 8.7* 8.0*  GLUCOSE 158* 130* 98     Imaging/Diagnostic Tests: none   Richarda Osmond, DO 11/15/2017, 7:00 AM PGY-1, [place] Intern pager: [phone], text pages welcome

## 2017-11-15 NOTE — Progress Notes (Signed)
Electrophysiology Rounding Note  Patient Name: [name] Date of Encounter: 11/15/2017  Primary Cardiologist: [name] Electrophysiologist: Curt Bears   Subjective   Well yesterday, but feels weak tired and fatigued today.  Was in sinus rhythm for quite a while overnight.  Felt well in sinus rhythm.  Went to have a bowel movement and converted to rapid atrial fibrillation with rates in the 170s.  Inpatient Medications    Scheduled Meds: . aspirin EC  81 mg Oral Daily  . diltiazem  180 mg Oral Daily  . enoxaparin (LOVENOX) injection  40 mg Subcutaneous Q24H  . insulin aspart  0-9 Units Subcutaneous TID WC  . metFORMIN  1,000 mg Oral BID WC  . pantoprazole  40 mg Oral BID AC  . pravastatin  40 mg Oral QPM  . tamsulosin  0.4 mg Oral Daily   Continuous Infusions:  PRN Meds: acetaminophen, diclofenac sodium, ondansetron (ZOFRAN) IV, polyethylene glycol   Vital Signs    Vitals:   11/14/17 1612 11/14/17 2038 11/15/17 0501 11/15/17 0505  BP: 110/62 115/61  117/71  Pulse: 84 98  85  Resp:  18  18  Temp: 98 F (36.7 C) (!) 97.5 F (36.4 C)  97.6 F (36.4 C)  TempSrc: Oral Oral  Oral  SpO2: 100% 99%  97%  Weight:   75.1 kg   Height:        Intake/Output Summary (Last 24 hours) at 11/15/2017 0754 Last data filed at 11/15/2017 0655 Gross per 24 hour  Intake 1410 ml  Output 1025 ml  Net 385 ml   Filed Weights   11/13/17 0500 11/14/17 0433 11/15/17 0501  Weight: 76 kg 75.8 kg 75.1 kg    Physical Exam    GEN: Well nourished, well developed, in no acute distress  HEENT: normal  Neck: no JVD, carotid bruits, or masses Cardiac: iRRR; no murmurs, rubs, or gallops,no edema  Respiratory:  clear to auscultation bilaterally, normal work of breathing GI: soft, nontender, nondistended, + BS MS: no deformity or atrophy  Skin: warm and dry Neuro:  Strength and sensation are intact Psych: euthymic mood, full affect    Labs    CBC Recent Labs    11/12/17 1318  11/13/17 0401  WBC 7.4 6.5  HGB 10.6* 10.2*  HCT 34.2* 33.0*  MCV 93.2 93.2  PLT 277 048   Basic Metabolic Panel Recent Labs    11/12/17 1318 11/12/17 1733 11/13/17 0401  NA 139  --  139  K 4.1  --  4.0  CL 108  --  108  CO2 22  --  25  GLUCOSE 130*  --  98  BUN 10  --  10  CREATININE 0.84  --  0.97  CALCIUM 8.7*  --  8.0*  MG  --  2.0  --      Telemetry    Sinus rhythm converting to atrial fibrillation-personally reviewed  Radiology    No results found.  Assessment & Plan    1.  Longstanding persistent atrial fibrillation Currently not a candidate for anticoagulation.  He is on diltiazem 180 mg daily.  He was having up to 3-second pauses on higher doses of diltiazem.  He has had sinus rhythm over the last 24 hours and thus we can choose a rhythm control strategy.  He also has shown signs of atrial flutter based on EKG done yesterday.  As he is in sinus rhythm, we can safely start antiarrhythmics.  Zailyn Rowser start amiodarone today.  Should  he have more rapid atrial fibrillation, he would likely benefit from pacemaker implant for tachybradycardia syndrome.  Should he convert to sinus rhythm with amiodarone or have improved rate control, would be able to be discharged later this afternoon on 400 mg of amiodarone twice a day for 2 weeks followed by 400 mg of amiodarone daily for 2 weeks followed by 200 mg of amiodarone daily.  For questions or updates, please contact [place] Please consult [email or web address] for contact info under Cardiology/STEMI.  Signed, Mannat Benedetti Meredith Leeds, MD  11/15/2017, 7:54 AM

## 2017-11-15 NOTE — Progress Notes (Addendum)
Paged about EKG with possible Atrial Flutter.  EKG appears very similar to previous EKG on 8/20.  Patient reported to be asymptomatic.  Ventricular rate is 77.  Patient is currently on diltiazem 180mg .  Doubt true atrial flutter as patient's ventricular rate is not greater than or equal to 150.  Patient is already on rate controlling medication and his ventricular rate is controlled on EKG.  There is no further acute management needed at this time.    - continue to monitor patient on telemetry   [place] Constable, D.O.  PGY-1 Family Medicine  11/15/2017 12:52 AM

## 2017-11-15 NOTE — Progress Notes (Signed)
Inpatient Diabetes Program Recommendations  AACE/ADA: New Consensus Statement on Inpatient Glycemic Control (2015)  Target Ranges:  Prepandial:   less than 140 mg/dL      Peak postprandial:   less than 180 mg/dL (1-2 hours)      Critically ill patients:  140 - 180 mg/dL   Lab Results  Component Value Date   GLUCAP 258 (H) 11/15/2017   HGBA1C 5.6 11/08/2017    Review of Glycemic Control Results for FLORA, RATZ (MRN [id number]) as of 11/15/2017 13:19  Ref. Range 11/14/2017 11:15 11/14/2017 15:44 11/14/2017 21:20 11/15/2017 07:43 11/15/2017 11:04  Glucose-Capillary Latest Ref Range: 70 - 99 mg/dL 210 (H) 181 (H) 153 (H) 135 (H) 258 (H)   Diabetes history: Type 2 DM Outpatient Diabetes medications: Metformin 1000 mg BID Current orders for Inpatient glycemic control: Metformin 1000 mg BID, Novolog 0-9 units TID  Inpatient Diabetes Program Recommendations:    For increased post prandials exceeding 180 mg/dL, may want to consider switching to carb modified diet. Will continue to follow.   Thanks, Bronson Curb, MSN, RNC-OB Diabetes Coordinator [phone] (8a-5p)

## 2017-11-15 NOTE — Progress Notes (Signed)
Occupational Therapy Treatment Patient Details Name: [name] MRN: [id number] DOB: April 09, 1940 Today's Date: 11/15/2017    History of present illness Patient is a 77 y/o male presenting to the ED on 11/12/17 with primary complaints of palpitations and dizziness. Of note, recently discharged on 8/17 with similar complaints. Admitted for paroxysmal AFib/flutter with RVR. PMH significant for A-fib(S/P ablation 2012, diltiazem daily), COPD, CAD, diastolic CHF, Z6WF, HTN, HLD, anemia.   OT comments  This 77 yo male admitted with above presents to acute OT at an Mod independent level (due to having to take increased time for all activities due to SOB/DOE with increased HR) in his room. Pt awaiting decision on possible pacemaker. HR when I entered was in the 103's but went down into 90's within me being in room with him for 1 minute,had pt get up to simulate tub transfer and HR into 150's with pt noting SOB--had pt sit down and perform purse lipped breathing with HR coming down to 110's.  Follow Up Recommendations  Home health OT;Supervision/Assistance - 24 hour;Other (comment)(will probably need SNF if has pacemaker placed)    Equipment Recommendations  None recommended by OT       Precautions / Restrictions Precautions Precaution Comments: monitor HR Restrictions Weight Bearing Restrictions: No       Mobility Bed Mobility               General bed mobility comments: At EOB upon arrival  Transfers Overall transfer level: Independent                        ADL either performed or assessed with clinical judgement   ADL Overall ADL's : Needs assistance/impaired                                       General ADL Comments: Pt at an overall Independent level with basic ADLs including simulating stepping over a tub while holding onto sink (has a grab bar at home). Pt with SOB post getting up and moving around room (educated on purse lipped breathing which  did bring HR down from 150's to 110s). We did briefly discuss his limitations with arm if he has to have pacemaker.     Vision Baseline Vision/History: Wears glasses Wears Glasses: Reading only            Cognition Arousal/Alertness: Awake/alert Behavior During Therapy: WFL for tasks assessed/performed Overall Cognitive Status: Within Functional Limits for tasks assessed                                                     Pertinent Vitals/ Pain       Pain Assessment: No/denies pain         Frequency  Min 2X/week        Progress Toward Goals  OT Goals(current goals can now be found in the care plan section)  Progress towards OT goals: Progressing toward goals     Plan Other (comment)(D/C may need to be updated pending pacemaker or not (if pacemaker will probably need SNF))       AM-PAC PT "6 Clicks" Daily Activity     Outcome Measure   Help from another person eating meals?: None Help  from another person taking care of personal grooming?: None Help from another person toileting, which includes using toliet, bedpan, or urinal?: None Help from another person bathing (including washing, rinsing, drying)?: None Help from another person to put on and taking off regular upper body clothing?: None Help from another person to put on and taking off regular lower body clothing?: None 6 Click Score: 24    End of Session Equipment Utilized During Treatment: (none)  OT Visit Diagnosis: Other (comment)(SOB/DOE)   Activity Tolerance (limited by increase SOB with increased HR)   Patient Left (sitting EOB)   Nurse Communication          Time: 7703-4035 OT Time Calculation (min): 10 min  Charges: OT General Charges $OT Visit: 1 Visit OT Treatments $Self Care/Home Management : 8-22 mins  .[place], [place] [phone] 11/15/2017

## 2017-11-16 LAB — GLUCOSE, CAPILLARY
GLUCOSE-CAPILLARY: 175 mg/dL — AB (ref 70–99)
Glucose-Capillary: 155 mg/dL — ABNORMAL HIGH (ref 70–99)

## 2017-11-16 MED ORDER — AMIODARONE HCL 400 MG PO TABS: 400.0000 mg | ORAL_TABLET | Freq: Every day | ORAL | 0 refills | Status: DC

## 2017-11-16 MED ORDER — AMIODARONE HCL 400 MG PO TABS: 400.0000 mg | ORAL_TABLET | Freq: Two times a day (BID) | ORAL | 0 refills | Status: DC

## 2017-11-16 MED ORDER — MELATONIN 3 MG PO TABS: 3.0000 mg | ORAL_TABLET | Freq: Every day | ORAL | 0 refills | Status: DC

## 2017-11-16 MED ORDER — DILTIAZEM HCL ER COATED BEADS 180 MG PO CP24: 180.0000 mg | ORAL_CAPSULE | Freq: Every day | ORAL | 0 refills | Status: DC

## 2017-11-16 NOTE — Progress Notes (Signed)
Pt has orders to be discharged. Discharge instructions given and pt has no additional questions at this time. Medication regimen reviewed and pt educated. Pt verbalized understanding and has no additional questions. Telemetry box removed. IV removed and site in good condition. Pt stable and waiting for transportation. 

## 2017-11-16 NOTE — Discharge Instructions (Signed)
You were treated for atrial fibrillation. Please continue to take Amiodarone 400mg  (new medication) twice a day until September 5th (2 weeks total). Then you will only take the Amiodarone 400mg  once per day for two weeks total (September 6-20). Since you have an appointment with your cardiologist on September 17th, you can get a refill for the rest of your Amiodarone that will be 200mg  per day after September 20th.   Atrial Fibrillation Atrial fibrillation is a type of heartbeat that is irregular or fast (rapid). If you have this condition, your heart keeps quivering in a weird (chaotic) way. This condition can make it so your heart cannot pump blood normally. Having this condition gives a person more risk for stroke, heart failure, and other heart problems. There are different types of atrial fibrillation. Talk with your doctor to learn about the type that you have. Follow these instructions at home:  Take over-the-counter and prescription medicines only as told by your doctor.  If your doctor prescribed a blood-thinning medicine, take it exactly as told. Taking too much of it can cause bleeding. If you do not take enough of it, you will not have the protection that you need against stroke and other problems.  Do not use any tobacco products. These include cigarettes, chewing tobacco, and e-cigarettes. If you need help quitting, ask your doctor.  If you have apnea (obstructive sleep apnea), manage it as told by your doctor.  Do not drink alcohol.  Do not drink beverages that have caffeine. These include coffee, soda, and tea.  Maintain a healthy weight. Do not use diet pills unless your doctor says they are safe for you. Diet pills may make heart problems worse.  Follow diet instructions as told by your doctor.  Exercise regularly as told by your doctor.  Keep all follow-up visits as told by your doctor. This is important. Contact a doctor if:  You notice a change in the speed, rhythm,  or strength of your heartbeat.  You are taking a blood-thinning medicine and you notice more bruising.  You get tired more easily when you move or exercise. Get help right away if:  You have pain in your chest or your belly (abdomen).  You have sweating or weakness.  You feel sick to your stomach (nauseous).  You notice blood in your throw up (vomit), poop (stool), or pee (urine).  You are short of breath.  You suddenly have swollen feet and ankles.  You feel dizzy.  Your suddenly get weak or numb in your face, arms, or legs, especially if it happens on one side of your body.  You have trouble talking, trouble understanding, or both.  Your face or your eyelid droops on one side. These symptoms may be an emergency. Do not wait to see if the symptoms will go away. Get medical help right away. Call your local emergency services (911 in the [place] Do not drive yourself to the hospital. This information is not intended to replace advice given to you by your health care provider. Make sure you discuss any questions you have with your health care provider. Document Released: 12/21/2007 Document Revised: 08/19/2015 Document Reviewed: 07/08/2014 Elsevier Interactive Patient Education  [employer].

## 2017-11-16 NOTE — Progress Notes (Addendum)
Family Medicine Teaching Service Daily Progress Note Intern Pager: [phone]  Patient name: [name] Medical record number: [id number] Date of birth: [birth year] Age: 77 y.o. Gender: male  Primary Care Provider: [name], MD  Code Status: full Consultations: electrophysiology Admission date: 8/19  Pt Overview and Major Events to Date:  Readmit 8/19 for uncontrolled Afib  Subjective: Overnight: no acute events Today: feels much better. Feels ready to go home. Slept well last night.   Objective: Blood pressure 113/88, pulse 86, temperature (!) 97.4 F (36.3 C), temperature source Oral, resp. rate 18, height 5\' 6"  (1.676 m), weight 74.6 kg, SpO2 96 %.  Physical Exam: General: NAD, pleasant. Sitting in chair Cardiovascular: RRR, no murmer appreciated Respiratory: CTAB, no increased WOB Abdomen: soft, no masses, non-distended, non-tender Extremities: well-perfused, all moving normally, no swelling  Assessment and Plan:  Paroxysmal Afib/flutter with RVR- Patient was discharged on 8/17 with similar chief complaint and continued to be symptomatic of Afib RVR with tachycardia on home monitoring.Electrophysiology further changed his dosage to 180mg  diltiazem once daily extended release yesterday. He had a 3 second pause on telemetry the night before with higher dose. He had no acute events over night last night on new dose. Added amiodarone yesterday. If he continues to have rapid atrial fibrillation, he would likely benefit from pacemaker implant for tachybradycardia syndrome. Vital signs normal today. Per electrophysiology yesterday: Should he convert to sinus rhythm with amiodarone or have improved rate control, would be able to be discharged later this afternoon on 400 mg of amiodarone twice a day for 2 weeks followed by 400 mg of amiodarone daily for 2 weeks followed by 200 mg of amiodarone daily.  Per electrophysiology today: Really not a candidate for anticoagulation due  to GI bleeding early on diltiazem 180 mg daily.  He was put on amiodarone yesterday and has mainly been in sinus rhythm.  I feel that continued amiodarone dosing will likely keep him in sinus rhythm and prevent further rapid atrial fibrillation.  Should he go back into atrial fibrillation in the future, he would benefit from pacemaker implant.  Would plan for discharge today on amiodarone.We monitored him overnight with no acute events and no symptoms of palpitations -continue telemetry -follow electrophysiology recs             -continue diltiazem 180mg  extended release daily  -added amiodarone 400mg  BID yesterday             -discontinued metoprolol             -lovenox prophylaxis on board  -possible pacemaker placement at later date -discharge today - repeat EKG  CAD/HFrEF- no evidence of fluid overload on exam. Left heart cath 7/12 showed single vessel CAD involving the ostium of the first septal perforator, otherwise nonobstructive. Normal LVEDP. No intervention. ECHO on 7/12 showed normal LV size with normal systolic function and eEF 55-60%.  - monitor I&Os -follow cards recs -added amiodarone   T2DM A1c 5.6% on last admission. Glucose this morning is 155 - continue metformin 1000mg  BID -sSSi - monitor CBGs daily  -discharge with no insulin regimen  COPD- chronic, stable. resolved SOB and O2 sats 96 ORA. -continue home meds - monitor  Iron Deficiency Anemia- Hgb 10.2, MCV 93.2 8/20. Ferritin on 8/17 was 17. patient takes multivitamin/ mineral BID at home - continue to monitor with daily CBC - consider adding iron supplement if repeat Hgb decreases  Insomnia- given melatonin yesterday and states that it helped him with sleep.  He states he does not have problems sleeping at home so no need to continue on discharge  Chronic Back Pain- patient states he has chronic mild lower back pain on admission unchanged from previous pain. Has diclofenac gel at home - continue  diclofenac gel PRN  BPH urine output overnight was ~851 Stable on tamsulosin at home. - continue tamsulosin - monitor I&Os   FEN/GI: regular diet PPx: lovenox  Disposition: telemetry   Laboratory: CBC Latest Ref Rng & Units 11/13/2017 11/12/2017 11/10/2017  WBC 4.0 - 10.5 K/uL 6.5 7.4 8.4  Hemoglobin 13.0 - 17.0 g/dL 10.2(L) 10.6(L) 10.9(L)  Hematocrit 39.0 - 52.0 % 33.0(L) 34.2(L) 35.2(L)  Platelets 150 - 400 K/uL 253 277 244   CMP Latest Ref Rng & Units 11/13/2017 11/12/2017 11/09/2017  Glucose 70 - 99 mg/dL 98 130(H) 158(H)  BUN 8 - 23 mg/dL 10 10 11   Creatinine 0.61 - 1.24 mg/dL 0.97 0.84 0.99  Sodium 135 - 145 mmol/L 139 139 139  Potassium 3.5 - 5.1 mmol/L 4.0 4.1 3.9  Chloride 98 - 111 mmol/L 108 108 106  CO2 22 - 32 mmol/L 25 22 24   Calcium 8.9 - 10.3 mg/dL 8.0(L) 8.7(L) 7.9(L)  Total Protein 6.5 - 8.1 g/dL - - -  Total Bilirubin 0.3 - 1.2 mg/dL - - -  Alkaline Phos 38 - 126 U/L - - -  AST 15 - 41 U/L - - -  ALT 0 - 44 U/L - - -    Imaging/Diagnostic Tests: none   Richarda Osmond, DO 11/16/2017, 6:52 AM PGY-1, [place] Intern pager: [phone], text pages welcome

## 2017-11-16 NOTE — Progress Notes (Signed)
   Electrophysiology Rounding Note  Patient Name: [name] Date of Encounter: 11/16/2017  Primary Cardiologist: [name] Electrophysiologist: Curt Bears   Subjective   Improved today as compared to yesterday.  Not weak and fatigued.  Ready to go home.  Inpatient Medications    Scheduled Meds: . amiodarone  400 mg Oral BID  . aspirin EC  81 mg Oral Daily  . diltiazem  180 mg Oral Daily  . enoxaparin (LOVENOX) injection  40 mg Subcutaneous Q24H  . insulin aspart  0-9 Units Subcutaneous TID WC  . Melatonin  3 mg Oral QHS  . metFORMIN  1,000 mg Oral BID WC  . pantoprazole  40 mg Oral BID AC  . pravastatin  40 mg Oral QPM  . tamsulosin  0.4 mg Oral Daily   Continuous Infusions:  PRN Meds: acetaminophen, diclofenac sodium, ondansetron (ZOFRAN) IV, polyethylene glycol   Vital Signs    Vitals:   11/15/17 1156 11/15/17 1742 11/15/17 1931 11/16/17 0442  BP: (!) 114/58 (!) 111/55 115/61 113/88  Pulse: 87 91 92 86  Resp:   20 18  Temp: 97.7 F (36.5 C) 98 F (36.7 C) 97.7 F (36.5 C) (!) 97.4 F (36.3 C)  TempSrc: Oral Oral Oral Oral  SpO2: 98% 100% 96% 96%  Weight:    74.6 kg  Height:        Intake/Output Summary (Last 24 hours) at 11/16/2017 0753 Last data filed at 11/16/2017 0700 Gross per 24 hour  Intake 340 ml  Output 851 ml  Net -511 ml   Filed Weights   11/14/17 0433 11/15/17 0501 11/16/17 0442  Weight: 75.8 kg 75.1 kg 74.6 kg    Physical Exam    GEN: Well nourished, well developed, in no acute distress  HEENT: normal  Neck: no JVD, carotid bruits, or masses Cardiac: RRR; no murmurs, rubs, or gallops,no edema  Respiratory:  clear to auscultation bilaterally, normal work of breathing GI: soft, nontender, nondistended, + BS MS: no deformity or atrophy  Skin: warm and dry Neuro:  Strength and sensation are intact Psych: euthymic mood, full affect  Labs    CBC No results for input(s): WBC, NEUTROABS, HGB, HCT, MCV, PLT in the last 72 hours. Basic  Metabolic Panel No results for input(s): NA, K, CL, CO2, GLUCOSE, BUN, CREATININE, CALCIUM, MG, PHOS in the last 72 hours.   Telemetry    This rhythm with short episodes of atrial fibrillation-personally reviewed  Radiology    No results found.  Assessment & Plan    1.  Longstanding persistent atrial fibrillation Really not a candidate for anticoagulation due to GI bleeding early on diltiazem 180 mg daily.  He was put on amiodarone yesterday and has mainly been in sinus rhythm.  I feel that continued amiodarone dosing Rayli Wiederhold likely keep him in sinus rhythm and prevent further rapid atrial fibrillation.  Should he go back into atrial fibrillation in the future, he would benefit from pacemaker implant.  Would plan for discharge today on amiodarone.  [facility] Sher Hellinger sign off.   Medication Recommendations: [place] 400 mg twice daily for 2 weeks followed by 400 mg daily for 2 weeks followed by 200 mg daily Other recommendations (labs, testing, etc): None Follow up as an outpatient: Scheduled with see [facility] heart care   For questions or updates, please contact [facility] Please consult [email or web address] for contact info under Cardiology/STEMI.  Signed, Copeland Lapier Meredith Leeds, MD  11/16/2017, 7:53 AM

## 2017-11-16 NOTE — Care Management Important Message (Signed)
Important Message  Patient Details  Name: [name] MRN: [id number] Date of Birth: 1940/09/06   Medicare Important Message Given:  Yes    Larra Crunkleton P Kennidy Lamke 11/16/2017, 2:02 PM

## 2017-11-17 DIAGNOSIS — I4892 Unspecified atrial flutter: Secondary | ICD-10-CM | POA: Diagnosis not present

## 2017-11-17 DIAGNOSIS — I480 Paroxysmal atrial fibrillation: Secondary | ICD-10-CM | POA: Diagnosis not present

## 2017-11-18 DIAGNOSIS — I4892 Unspecified atrial flutter: Secondary | ICD-10-CM | POA: Diagnosis not present

## 2017-11-18 DIAGNOSIS — I480 Paroxysmal atrial fibrillation: Secondary | ICD-10-CM | POA: Diagnosis not present

## 2017-11-20 ENCOUNTER — Telehealth: Payer: Self-pay

## 2017-11-20 DIAGNOSIS — I480 Paroxysmal atrial fibrillation: Secondary | ICD-10-CM | POA: Diagnosis not present

## 2017-11-20 DIAGNOSIS — I4892 Unspecified atrial flutter: Secondary | ICD-10-CM | POA: Diagnosis not present

## 2017-11-20 NOTE — Telephone Encounter (Signed)
[facility] pharmacy calling for clarification on miralax rx- does not have a frequency. Phone number [phone] to provide frequency of use Wallace Cullens, RN

## 2017-11-20 NOTE — Telephone Encounter (Signed)
[employer] given updated rx directions Wallace Cullens, RN

## 2017-11-20 NOTE — Telephone Encounter (Signed)
Please call pharmacy: One capful mixed with 8 ounces of water daily as needed for constipation.

## 2017-11-21 ENCOUNTER — Other Ambulatory Visit: Payer: Self-pay

## 2017-11-21 ENCOUNTER — Encounter: Payer: Self-pay | Admitting: Family

## 2017-11-21 ENCOUNTER — Inpatient Hospital Stay: Payer: Medicare Other | Attending: Hematology & Oncology

## 2017-11-21 ENCOUNTER — Inpatient Hospital Stay (HOSPITAL_BASED_OUTPATIENT_CLINIC_OR_DEPARTMENT_OTHER): Payer: Medicare Other | Admitting: Family

## 2017-11-21 VITALS — BP 120/53 | HR 74 | Temp 97.9°F | Resp 20 | Wt 170.4 lb

## 2017-11-21 DIAGNOSIS — D508 Other iron deficiency anemias: Secondary | ICD-10-CM

## 2017-11-21 DIAGNOSIS — Z79899 Other long term (current) drug therapy: Secondary | ICD-10-CM

## 2017-11-21 DIAGNOSIS — I4891 Unspecified atrial fibrillation: Secondary | ICD-10-CM | POA: Insufficient documentation

## 2017-11-21 DIAGNOSIS — D500 Iron deficiency anemia secondary to blood loss (chronic): Secondary | ICD-10-CM | POA: Insufficient documentation

## 2017-11-21 DIAGNOSIS — K922 Gastrointestinal hemorrhage, unspecified: Secondary | ICD-10-CM | POA: Insufficient documentation

## 2017-11-21 DIAGNOSIS — Z7982 Long term (current) use of aspirin: Secondary | ICD-10-CM | POA: Diagnosis not present

## 2017-11-21 DIAGNOSIS — G629 Polyneuropathy, unspecified: Secondary | ICD-10-CM

## 2017-11-21 DIAGNOSIS — Z794 Long term (current) use of insulin: Secondary | ICD-10-CM

## 2017-11-21 LAB — CMP (CANCER CENTER ONLY)
ALK PHOS: 81 U/L (ref 38–126)
ALT: 31 U/L (ref 0–44)
ANION GAP: 8 (ref 5–15)
AST: 21 U/L (ref 15–41)
Albumin: 3.1 g/dL — ABNORMAL LOW (ref 3.5–5.0)
BUN: 15 mg/dL (ref 8–23)
CALCIUM: 9.2 mg/dL (ref 8.9–10.3)
CO2: 27 mmol/L (ref 22–32)
Chloride: 102 mmol/L (ref 98–111)
Creatinine: 1.03 mg/dL (ref 0.61–1.24)
GFR, Est AFR Am: >60 mL/min (ref >60)
GFR, Estimated: >60 mL/min (ref >60)
Glucose, Bld: 263 mg/dL — ABNORMAL HIGH (ref 70–99)
Potassium: 4.4 mmol/L (ref 3.5–5.1)
SODIUM: 137 mmol/L (ref 135–145)
TOTAL PROTEIN: 5.9 g/dL — AB (ref 6.5–8.1)
Total Bilirubin: <0.2 mg/dL — ABNORMAL LOW (ref 0.3–1.2)

## 2017-11-21 LAB — IRON AND TIBC
Iron: 16 ug/dL — ABNORMAL LOW (ref 42–163)
SATURATION RATIOS: 4 % — AB (ref 42–163)
TIBC: 366 ug/dL (ref 202–409)
UIBC: 351 ug/dL

## 2017-11-21 LAB — CBC WITH DIFFERENTIAL (CANCER CENTER ONLY)
Basophils Absolute: 0.1 10*3/uL (ref 0.0–0.1)
Basophils Relative: 1 %
EOS ABS: 0.3 10*3/uL (ref 0.0–0.5)
Eosinophils Relative: 5 %
HCT: 33.7 % — ABNORMAL LOW (ref 38.7–49.9)
HEMOGLOBIN: 10.4 g/dL — AB (ref 13.0–17.1)
LYMPHS PCT: 14 %
Lymphs Abs: 0.7 10*3/uL — ABNORMAL LOW (ref 0.9–3.3)
MCH: 28 pg (ref 28.0–33.4)
MCHC: 30.9 g/dL — AB (ref 32.0–35.9)
MCV: 90.8 fL (ref 82.0–98.0)
Monocytes Absolute: 0.5 10*3/uL (ref 0.1–0.9)
Monocytes Relative: 10 %
NEUTROS PCT: 70 %
Neutro Abs: 3.7 10*3/uL (ref 1.5–6.5)
Platelet Count: 390 10*3/uL (ref 145–400)
RBC: 3.71 MIL/uL — ABNORMAL LOW (ref 4.20–5.70)
RDW: 15.1 % (ref 11.1–15.7)
WBC Count: 5.3 10*3/uL (ref 4.0–10.0)

## 2017-11-21 LAB — RETICULOCYTES
RBC.: 3.75 MIL/uL — ABNORMAL LOW (ref 4.20–5.82)
Retic Count, Absolute: 93.8 10*3/uL (ref 34.8–93.9)
Retic Ct Pct: 2.5 % — ABNORMAL HIGH (ref 0.8–1.8)

## 2017-11-21 LAB — FERRITIN: Ferritin: 8 ng/mL — ABNORMAL LOW (ref 24–336)

## 2017-11-21 NOTE — Progress Notes (Signed)
Hematology and Oncology Follow Up Visit  [name] [id number] 1941/02/12 77 y.o. 11/21/2017   Principle Diagnosis:  Iron deficiency anemia secondary to intermittent GI bleed  Current Therapy:   IV iron as indicated Supportive transfusions as needed   Interim History:  [name] is here today with his daughter for follow-up. He is doing much better since being hospitalized for atrial fib with RVR last week. They were able to increase his diltiazem and start amiodarone. This seems to have controlled his rate and he has a follow-up with cardiology on September 17th.  His energy is improving but he has not felt like cooking this week.  His appetite is improving and he is staying well hydrated. His weight is stable.  No episodes of bleeding to report. His skin is thin and he does bruise easily.  No fever, chills, n/v, cough, rash, dizziness, SOB, abdominal pain or changes in bowel or bladder habits.  No swelling or tenderness in his extremities. The neuropathy in his hands and feet is unchanged.  No lymphadenopathy noted on exam.   ECOG Performance Status: 1 - Symptomatic but completely ambulatory  Medications:  Allergies as of 11/21/2017      Reactions   Cyclobenzaprine Other (See Comments)   Unsteady gait      Medication List        Accurate as of 11/21/17  9:03 AM. Always use your most recent med list.          acetaminophen 325 MG tablet Commonly known as:  TYLENOL Take 325 mg by mouth every 6 (six) hours as needed for mild pain.   albuterol 108 (90 Base) MCG/ACT inhaler Commonly known as:  PROVENTIL HFA;VENTOLIN HFA Inhale 2 puffs into the lungs every 4 (four) hours as needed for wheezing or shortness of breath.   amiodarone 400 MG tablet Commonly known as:  PACERONE Take 1 tablet (400 mg total) by mouth 2 (two) times daily for 13 days.   amiodarone 400 MG tablet Commonly known as:  PACERONE Take 1 tablet (400 mg total) by mouth daily for 14 days. Start  taking on:  11/30/2017   aspirin EC 81 MG tablet Take 81 mg by mouth daily.   B-D ULTRAFINE III SHORT PEN 31G X 8 MM Misc Generic drug:  Insulin Pen Needle   co-enzyme Q-10 30 MG capsule Take 30 mg by mouth daily.   diclofenac sodium 1 % Gel Commonly known as:  VOLTAREN Apply 4 g topically 3 (three) times daily as needed (for back pain).   diltiazem 180 MG 24 hr capsule Commonly known as:  CARDIZEM CD Take 1 capsule (180 mg total) by mouth daily.   hydrocortisone cream 1 % Apply 1 application topically 2 (two) times daily.   Melatonin 3 MG Tabs Take 1 tablet (3 mg total) by mouth at bedtime.   metFORMIN 1000 MG tablet Commonly known as:  GLUCOPHAGE Take 1,000 mg by mouth 2 (two) times daily with a meal.   multivitamin with minerals Tabs tablet Take 1 tablet by mouth 2 (two) times daily.   nitroGLYCERIN 0.4 MG SL tablet Commonly known as:  NITROSTAT Place 1 tablet (0.4 mg total) under the tongue every 5 (five) minutes x 3 doses as needed for chest pain.   pantoprazole 40 MG tablet Commonly known as:  PROTONIX Take 1 tablet (40 mg total) by mouth 2 (two) times daily before a meal.   polyethylene glycol packet Commonly known as:  MIRALAX / GLYCOLAX Take 17 g  by mouth as needed for moderate constipation.   pravastatin 40 MG tablet Commonly known as:  PRAVACHOL Take 1 tablet (40 mg total) by mouth every evening.   tamsulosin 0.4 MG Caps capsule Commonly known as:  FLOMAX Take 0.4 mg by mouth daily.       Allergies:  Allergies  Allergen Reactions  . Cyclobenzaprine Other (See Comments)    Unsteady gait    Past Medical History, Surgical history, Social history, and Family History were reviewed and updated.  Review of Systems: All other 10 point review of systems is negative.   Physical Exam:  weight is 170 lb 6.4 oz (77.3 kg). His oral temperature is 97.9 F (36.6 C). His blood pressure is 120/53 (abnormal) and his pulse is 74. His respiration is 20 and  oxygen saturation is 100%.   Wt Readings from Last 3 Encounters:  11/21/17 170 lb 6.4 oz (77.3 kg)  11/16/17 164 lb 8 oz (74.6 kg)  11/10/17 167 lb 4.8 oz (75.9 kg)    Ocular: Sclerae unicteric, pupils equal, round and reactive to light Ear-nose-throat: Oropharynx clear, dentition fair Lymphatic: No cervical, supraclavicular or axillary adenopathy Lungs no rales or rhonchi, good excursion bilaterally Heart regular rate and rhythm, no murmur appreciated Abd soft, nontender, positive bowel sounds, no liver or spleen tip palpated on exam, no fluid wave  MSK no focal spinal tenderness, no joint edema Neuro: non-focal, well-oriented, appropriate affect Breasts: Deferred   Lab Results  Component Value Date   WBC 5.3 11/21/2017   HGB 10.4 (L) 11/21/2017   HCT 33.7 (L) 11/21/2017   MCV 90.8 11/21/2017   PLT 390 11/21/2017   Lab Results  Component Value Date   FERRITIN 17 (L) 11/10/2017   IRON 26 (L) 10/10/2017   TIBC 340 10/10/2017   UIBC 314 10/10/2017   IRONPCTSAT 8 (L) 10/10/2017   Lab Results  Component Value Date   RETICCTPCT 2.8 (H) 10/10/2017   RBC 3.71 (L) 11/21/2017   No results found for: KPAFRELGTCHN, LAMBDASER, KAPLAMBRATIO No results found for: IGGSERUM, IGA, IGMSERUM No results found for: Odetta Pink, SPEI   Chemistry      Component Value Date/Time   NA 139 11/13/2017 0401   NA 139 10/20/2016 1110   K 4.0 11/13/2017 0401   K 4.0 10/20/2016 1110   CL 108 11/13/2017 0401   CO2 25 11/13/2017 0401   CO2 26 10/20/2016 1110   BUN 10 11/13/2017 0401   BUN 15.2 10/20/2016 1110   CREATININE 0.97 11/13/2017 0401   CREATININE 1.10 10/10/2017 0916   CREATININE 0.8 10/20/2016 1110      Component Value Date/Time   CALCIUM 8.0 (L) 11/13/2017 0401   CALCIUM 9.6 10/20/2016 1110   ALKPHOS 90 10/28/2017 1134   ALKPHOS 85 10/20/2016 1110   AST 27 10/28/2017 1134   AST 31 10/10/2017 0916   AST 33 10/20/2016 1110     ALT 26 10/28/2017 1134   ALT 58 (H) 10/10/2017 0916   ALT 42 10/20/2016 1110   BILITOT 0.3 10/28/2017 1134   BILITOT 0.3 10/10/2017 0916   BILITOT <0.22 10/20/2016 1110      Impression and Plan: [name] is a very pleasant 77 yo caucasian gentleman with iron deficiency anemia secondary to intermittent GI blood loss and malabsorption on Protonix.  He is recuperating from his admission in the hospital for atrial fib.  His Hgb is stable at 10.4, MCV 90.  We will see what his  iron studies show and bring him back in for infusion if needed.  We will go ahead and plan to see him back in another 6 weeks for follow-up.  They will contact our office with any questions or concerns. We can certainly see him sooner if need be.   Laverna Peace, NP 8/28/20199:03 AM

## 2017-11-23 DIAGNOSIS — I480 Paroxysmal atrial fibrillation: Secondary | ICD-10-CM | POA: Diagnosis not present

## 2017-11-23 DIAGNOSIS — I4892 Unspecified atrial flutter: Secondary | ICD-10-CM | POA: Diagnosis not present

## 2017-11-27 ENCOUNTER — Other Ambulatory Visit: Payer: Self-pay

## 2017-11-27 ENCOUNTER — Emergency Department (HOSPITAL_COMMUNITY)
Admission: EM | Admit: 2017-11-27 | Discharge: 2017-11-28 | Disposition: A | Discharge status: Home / Self Care | Payer: Medicare Other | Attending: Emergency Medicine | Admitting: Emergency Medicine

## 2017-11-27 ENCOUNTER — Encounter (HOSPITAL_COMMUNITY): Payer: Self-pay

## 2017-11-27 ENCOUNTER — Inpatient Hospital Stay: Payer: Medicare Other | Attending: Hematology & Oncology

## 2017-11-27 VITALS — BP 104/50 | HR 77 | Temp 97.8°F | Resp 20

## 2017-11-27 DIAGNOSIS — I110 Hypertensive heart disease with heart failure: Secondary | ICD-10-CM | POA: Insufficient documentation

## 2017-11-27 DIAGNOSIS — Z794 Long term (current) use of insulin: Secondary | ICD-10-CM | POA: Insufficient documentation

## 2017-11-27 DIAGNOSIS — Z7982 Long term (current) use of aspirin: Secondary | ICD-10-CM | POA: Insufficient documentation

## 2017-11-27 DIAGNOSIS — Z79899 Other long term (current) drug therapy: Secondary | ICD-10-CM | POA: Diagnosis not present

## 2017-11-27 DIAGNOSIS — E119 Type 2 diabetes mellitus without complications: Secondary | ICD-10-CM | POA: Insufficient documentation

## 2017-11-27 DIAGNOSIS — I482 Chronic atrial fibrillation, unspecified: Secondary | ICD-10-CM

## 2017-11-27 DIAGNOSIS — F17210 Nicotine dependence, cigarettes, uncomplicated: Secondary | ICD-10-CM | POA: Insufficient documentation

## 2017-11-27 DIAGNOSIS — I4891 Unspecified atrial fibrillation: Secondary | ICD-10-CM | POA: Diagnosis not present

## 2017-11-27 DIAGNOSIS — I4892 Unspecified atrial flutter: Secondary | ICD-10-CM | POA: Diagnosis not present

## 2017-11-27 DIAGNOSIS — I5032 Chronic diastolic (congestive) heart failure: Secondary | ICD-10-CM | POA: Insufficient documentation

## 2017-11-27 DIAGNOSIS — J449 Chronic obstructive pulmonary disease, unspecified: Secondary | ICD-10-CM | POA: Insufficient documentation

## 2017-11-27 DIAGNOSIS — D500 Iron deficiency anemia secondary to blood loss (chronic): Secondary | ICD-10-CM | POA: Diagnosis not present

## 2017-11-27 DIAGNOSIS — R42 Dizziness and giddiness: Secondary | ICD-10-CM | POA: Diagnosis not present

## 2017-11-27 DIAGNOSIS — D508 Other iron deficiency anemias: Secondary | ICD-10-CM

## 2017-11-27 DIAGNOSIS — I480 Paroxysmal atrial fibrillation: Secondary | ICD-10-CM | POA: Diagnosis not present

## 2017-11-27 LAB — URINALYSIS, ROUTINE W REFLEX MICROSCOPIC
BACTERIA UA: NONE SEEN
BILIRUBIN URINE: NEGATIVE
Glucose, UA: NEGATIVE mg/dL
HGB URINE DIPSTICK: NEGATIVE
Ketones, ur: NEGATIVE mg/dL
NITRITE: NEGATIVE
PH: 7 (ref 5.0–8.0)
Protein, ur: NEGATIVE mg/dL
Specific Gravity, Urine: 1.004 — ABNORMAL LOW (ref 1.005–1.030)

## 2017-11-27 LAB — BASIC METABOLIC PANEL
ANION GAP: 10 (ref 5–15)
BUN: 11 mg/dL (ref 8–23)
CO2: 22 mmol/L (ref 22–32)
Calcium: 8.9 mg/dL (ref 8.9–10.3)
Chloride: 108 mmol/L (ref 98–111)
Creatinine, Ser: 0.99 mg/dL (ref 0.61–1.24)
GFR calc Af Amer: >60 mL/min (ref >60)
Glucose, Bld: 112 mg/dL — ABNORMAL HIGH (ref 70–99)
POTASSIUM: 4 mmol/L (ref 3.5–5.1)
SODIUM: 140 mmol/L (ref 135–145)

## 2017-11-27 LAB — CBC
HEMATOCRIT: 32.3 % — AB (ref 39.0–52.0)
HEMOGLOBIN: 9.9 g/dL — AB (ref 13.0–17.0)
MCH: 27.3 pg (ref 26.0–34.0)
MCHC: 30.7 g/dL (ref 30.0–36.0)
MCV: 89.2 fL (ref 78.0–100.0)
Platelets: 369 10*3/uL (ref 150–400)
RBC: 3.62 MIL/uL — ABNORMAL LOW (ref 4.22–5.81)
RDW: 15.5 % (ref 11.5–15.5)
WBC: 7.1 10*3/uL (ref 4.0–10.5)

## 2017-11-27 MED ORDER — SODIUM CHLORIDE 0.9 % IV SOLN
Freq: Once | INTRAVENOUS | Status: AC
  Administered 2017-11-27: via INTRAVENOUS

## 2017-11-27 MED ORDER — SODIUM CHLORIDE 0.9 % IV SOLN
INTRAVENOUS | Status: DC
  Administered 2017-11-27: 09:00:00 via INTRAVENOUS
  Filled 2017-11-27: qty 250

## 2017-11-27 MED ORDER — SODIUM CHLORIDE 0.9 % IV SOLN
510.0000 mg | Freq: Once | INTRAVENOUS | Status: AC
  Administered 2017-11-27: 510 mg via INTRAVENOUS
  Filled 2017-11-27: qty 17

## 2017-11-27 NOTE — Patient Instructions (Signed)

## 2017-11-27 NOTE — ED Provider Notes (Signed)
[facility] EMERGENCY DEPARTMENT Provider Note   CSN: [id number] Arrival date & time: 11/27/17  2208     History   Chief Complaint Chief Complaint  Patient presents with  . Atrial Fibrillation  . Dizziness    HPI [name] is a 77 y.o. male.   77 y/o male with hx of Paroxysmal Afib with RVR, CAD/HFrEF, T2DM, COPD, chronic back pain presents to the ED for c/o dizziness with onset at 1900. Hx of poorly controlled atrial fibrillation requiring many admissions; most recent d/c on 11/16/17. Was discharged on course of 180mg  diltiazem ER daily and amiodorone 400mg  BID.  States he is been compliant with these medications.  Notes that he has increased palpitations and dizziness when he exerts himself.  Feels a flushing sensation in his face before onset of these palpitations.  Denies any vomiting or syncope.  No recent fevers.  Try to hydrate with oral fluids today without relief.  Did have a reassuring evaluation by his home health nurse in the morning.  Cardiology visit scheduled for 12/11/17. Hx of 16 ED visits in the past 6 months.   Atrial Fibrillation   Dizziness    Past Medical History:  Diagnosis Date  . Anemia    takes Ferrous Sulfate daily  . Arthritis    "all over"  . Balance problem 01/2014  . CAP (community acquired pneumonia) 09/18/2014  . Cervical radiculopathy due to degenerative joint disease of spine   . COPD (chronic obstructive pulmonary disease) ([place])   . Coronary artery disease, non-occlusive    a. 03/2010 Nonocclusive disease by cath, performed for ST elevations on ECG;  b. 06/2013 Lexi MV: EF 60%, no ischemia.  . Diabetes mellitus type II    takes Metformin and Lantus daily  . Diastolic CHF, chronic ([place])    a. 12/2012 EF 55-60%, diast dysfxn, triv MR, mildly dil LA/RA.  [name] History of blood transfusion 1982   "when I had stomach OR"  . History of bronchitis    1998  . History of gastric ulcer   . HTN (hypertension)    takes  Diltiazem daily  . Hyperlipidemia    takes Pravastatin daily  . Joint pain   . PAF (paroxysmal atrial fibrillation) (HCC)    Recurrent after atrial flutter (a. 07/2010 Status post caval tricuspid isthmus ablation by Dr. Midge Aver Metoprolol daily), currently controlled on flecainide plus diltiazem and Coumadin  . Weakness    numbness and tingling both hands    Patient Active Problem List   Diagnosis Date Noted  . Atrial fibrillation with RVR ([place]) 11/08/2017  . Chest pain with moderate risk of acute coronary syndrome 10/04/2017  . HTN (hypertension) 05/21/2017  . Diabetes mellitus type II 05/21/2017  . Diastolic CHF, chronic ([place]) 05/21/2017  . COPD (chronic obstructive pulmonary disease) ([place]) 05/21/2017  . H/O atrial flutter 05/21/2017  . Coronary disease/nonobstructive 05/21/2017  . Acute blood loss anemia 10/02/2016  . Abdominal pain 10/02/2016  . Constipation 10/02/2016  . Left lower quadrant pain   . Coagulopathy ([place])   . Heme positive stool   . Symptomatic anemia 07/10/2016  . Syncope 05/15/2015  . Cervical disc disease 05/15/2015  . Abnormal urinalysis 05/15/2015  . Anemia 05/15/2015  . History of CVA (cerebrovascular accident) 04/03/2014  . Near syncope 02/15/2014  . Pre-syncope 02/15/2014  . Benign neoplasm of rectum and anal canal 12/02/2013  . Upper GI bleeding 11/30/2013  . Tobacco use disorder 11/30/2013  . Anticoagulation goal of INR 2  to 3, for PAF - CHA2DS2Vasc = 7 08/15/2013  . Type 2 diabetes mellitus ([place]) 01/22/2013  . Tobacco abuse counseling 12/15/2012  . Hyperlipidemia   . Obesity (BMI 30-39.9) 12/14/2012  . Atrial flutter - status post CTI ablation 07/29/2010  . Essential hypertension 07/29/2010  . Chronic diastolic congestive heart failure ([place]) 07/29/2010  . Coronary artery disease, non-occlusive 07/29/2010    Past Surgical History:  Procedure Laterality Date  . ATRIAL ABLATION SURGERY  08/05/10   CTI ablation for atrial flutter by JA    . CARDIAC CATHETERIZATION  2012   nl LV function, no occlusive CAD, PAF  . CARDIOVERSION  12/07/2010    Successful direct current cardioversion with atrial fibrillation to normal sinus rhythm  . CARPAL TUNNEL RELEASE Bilateral 01/30/2014   Procedure: BILATERAL CARPAL TUNNEL RELEASE;  Surgeon: Marianna Payment, MD;  Location: [place];  Service: Orthopedics;  Laterality: Bilateral;  . CATARACT EXTRACTION W/ INTRAOCULAR LENS  IMPLANT, BILATERAL Bilateral   . COLONOSCOPY [name] 12/02/2013   Procedure: COLONOSCOPY;  Surgeon: Irene Shipper, MD;  Location: [place];  Service: Endoscopy;  Laterality: [name];  . ESOPHAGOGASTRODUODENOSCOPY [name] 09/22/2014   Procedure: ESOPHAGOGASTRODUODENOSCOPY (EGD);  Surgeon: Ronald Lobo, MD;  Location: [facility] ENDOSCOPY;  Service: Endoscopy;  Laterality: [name];  . ESOPHAGOGASTRODUODENOSCOPY [name] 07/11/2016   Procedure: ESOPHAGOGASTRODUODENOSCOPY (EGD);  Surgeon: Doran Stabler, MD;  Location: [facility] ENDOSCOPY;  Service: Endoscopy;  Laterality: [name];  . INCISION AND DRAINAGE ABSCESS / HEMATOMA OF BURSA / KNEE / THIGH Left 1998   knee  . KNEE BURSECTOMY Left 1998  . LAPAROSCOPIC CHOLECYSTECTOMY  03/2010  . LEFT HEART CATH AND CORONARY ANGIOGRAPHY [name] 10/05/2017   Procedure: LEFT HEART CATH AND CORONARY ANGIOGRAPHY;  Surgeon: Martinique, Peter M, MD;  Location: [place] CV LAB;  Service: Cardiovascular;  Laterality: [name];  . NM MYOVIEW LTD  07/22/2013   Normal EF ~60%, no ischemia or infarction.  [name] PARTIAL GASTRECTOMY  1982   subtotal; "took out 30% for ulcers"  . TRANSTHORACIC ECHOCARDIOGRAM  02/16/2014   EF 60%, no RWMA. - otherwise normal  . YAG LASER APPLICATION Bilateral         Home Medications    Prior to Admission medications   Medication Sig Start Date End Date Taking? Authorizing Provider  acetaminophen (TYLENOL) 325 MG tablet Take 325 mg by mouth every 6 (six) hours as needed for mild pain.    [provider]  albuterol (PROVENTIL HFA;VENTOLIN HFA) 108 (90  Base) MCG/ACT inhaler Inhale 2 puffs into the lungs every 4 (four) hours as needed for wheezing or shortness of breath. 10/30/17   Palumbo, April, MD  amiodarone (PACERONE) 400 MG tablet Take 1 tablet (400 mg total) by mouth 2 (two) times daily for 13 days. 11/16/17 11/29/17  Doristine Mango L, DO  amiodarone (PACERONE) 400 MG tablet Take 1 tablet (400 mg total) by mouth daily for 14 days. 11/30/17 12/14/17  Doristine Mango L, DO  aspirin EC 81 MG tablet Take 81 mg by mouth daily.    [provider]  B-D ULTRAFINE III SHORT PEN 31G X 8 MM MISC  11/20/17   [provider]  co-enzyme Q-10 30 MG capsule Take 30 mg by mouth daily.     [provider]  diclofenac sodium (VOLTAREN) 1 % GEL Apply 4 g topically 3 (three) times daily as needed (for back pain). 11/10/17   Rory Percy, DO  diltiazem (CARDIZEM CD) 180 MG 24 hr capsule Take 1 capsule (180  mg total) by mouth daily. 11/17/17   Anderson, Chelsey L, DO  hydrocortisone cream 1 % Apply 1 application topically 2 (two) times daily.  01/02/17   [provider]  Melatonin 3 MG TABS Take 1 tablet (3 mg total) by mouth at bedtime. 11/16/17   Anderson, Chelsey L, DO  metFORMIN (GLUCOPHAGE) 1000 MG tablet Take 1,000 mg by mouth 2 (two) times daily with a meal.     [provider]  Multiple Vitamin (MULTIVITAMIN WITH MINERALS) TABS tablet Take 1 tablet by mouth 2 (two) times daily.     [provider]  nitroGLYCERIN (NITROSTAT) 0.4 MG SL tablet Place 1 tablet (0.4 mg total) under the tongue every 5 (five) minutes x 3 doses as needed for chest pain. 11/19/15   Erlene Quan, PA-C  pantoprazole (PROTONIX) 40 MG tablet Take 1 tablet (40 mg total) by mouth 2 (two) times daily before a meal. 11/30/16   Domenic Polite, MD  polyethylene glycol [facility] Floria Raveling) packet Take 17 g by mouth as needed for moderate constipation. 11/10/17   Rory Percy, DO  pravastatin (PRAVACHOL) 40 MG tablet Take 1 tablet (40 mg total)  by mouth every evening. 05/04/17   Leonie Man, MD  tamsulosin (FLOMAX) 0.4 MG CAPS capsule Take 0.4 mg by mouth daily. 02/20/17   [provider]    Family History Family History  Problem Relation Age of Onset  . Cancer Mother   . Heart attack Father   . Heart attack Brother     Social History Social History   Tobacco Use  . Smoking status: Current Every Day Smoker    Packs/day: 0.50    Years: 57.00    Pack years: 28.50    Types: Cigarettes  . Smokeless tobacco: Never Used  . Tobacco comment: He's been smoking between 0.5 and 2 ppd since age 79.  Substance Use Topics  . Alcohol use: No    Alcohol/week: 0.0 standard drinks    Comment: "quit drinking in 1986"  . Drug use: No     Allergies   Cyclobenzaprine   Review of Systems Review of Systems  Neurological: Positive for dizziness.  Ten systems reviewed and are negative for acute change, except as noted in the HPI.    Physical Exam Updated Vital Signs BP (!) 108/55   Pulse 68   Temp 98.2 F (36.8 C)   Resp 17   SpO2 95%   Physical Exam  Constitutional: He is oriented to person, place, and time. He appears well-developed and well-nourished. No distress.  Nontoxic appearing and in NAD  HENT:  Head: Normocephalic and atraumatic.  Eyes: Conjunctivae and EOM are normal. No scleral icterus.  Neck: Normal range of motion.  Cardiovascular: An irregularly irregular rhythm present.  Pulmonary/Chest: Effort normal. No stridor. No respiratory distress. He has no rales.  Respirations even and unlabored. Adventitious breath sounds bilaterally on exhalation.  Musculoskeletal: Normal range of motion.  Neurological: He is alert and oriented to person, place, and time. He exhibits normal muscle tone. Coordination normal.  Skin: Skin is warm and dry. No rash noted. He is not diaphoretic. No erythema. No pallor.  Psychiatric: He has a normal mood and affect. His behavior is normal.  Nursing note and vitals  reviewed.    ED Treatments / Results  Labs (all labs ordered are listed, but only abnormal results are displayed) Labs Reviewed  BASIC METABOLIC PANEL - Abnormal; Notable for the following components:  Result Value   Glucose, Bld 112 (*)    All other components within normal limits  CBC - Abnormal; Notable for the following components:   RBC 3.62 (*)    Hemoglobin 9.9 (*)    HCT 32.3 (*)    All other components within normal limits  URINALYSIS, ROUTINE W REFLEX MICROSCOPIC - Abnormal; Notable for the following components:   Color, Urine COLORLESS (*)    Specific Gravity, Urine 1.004 (*)    Leukocytes, UA TRACE (*)    All other components within normal limits  CBG MONITORING, ED - Abnormal; Notable for the following components:   Glucose-Capillary 120 (*)    All other components within normal limits  I-STAT TROPONIN, ED    EKG None  Radiology No results found.  Procedures Procedures (including critical care time)  Medications Ordered in ED Medications  0.9 %  sodium chloride infusion ( Intravenous Stopped 11/28/17 0429)     Initial Impression / Assessment and Plan / ED Course  I have reviewed the triage vital signs and the nursing notes.  Pertinent labs & imaging results that were available during my care of the patient were reviewed by me and considered in my medical decision making (see chart for details).     77 year old male presents to the emergency department for complaints of palpitations and dizziness.  He has history of paroxysmal atrial fibrillation which is chronic.  CHADs VASC is 6.  Cardiology has presently chosen to hold chronic anticoagulation.  Multiple inpatient hospitalizations for symptomatic A. Fib; most recently with d/c on 11/16/17.    The patient has had rate controlled atrial fibrillation since arrival.  He is hemodynamically stable.  Was treated with IV fluids.  Had no clinical decompensation.  Laboratory work-up is reassuring.  Have  encouraged continued compliance with his diltiazem and amiodarone.  Patient instructed to call his cardiologist to try and move up his scheduled follow-up visit.  Return precautions discussed and provided. Patient discharged in stable condition with no unaddressed concerns.   Final Clinical Impressions(s) / ED Diagnoses   Final diagnoses:  Chronic atrial fibrillation [facility])    ED Discharge Orders    None       Antonietta Breach, PA-C 11/28/17 4585    Jola Schmidt, MD 11/28/17 [phone]

## 2017-11-27 NOTE — ED Triage Notes (Signed)
Patient here by EMS for new onset dizziness that began around 7pm.  Patient taken off of flecainide and metoprolol and increased dose of diltiazem 2 weeks ago.  A&Ox4 at this time.  VAN negative.

## 2017-11-28 LAB — I-STAT TROPONIN, ED: Troponin i, poc: 0.01 ng/mL (ref 0.00–0.08)

## 2017-11-28 LAB — CBG MONITORING, ED: Glucose-Capillary: 120 mg/dL — ABNORMAL HIGH (ref 70–99)

## 2017-11-28 NOTE — Discharge Instructions (Addendum)
Continue your daily prescribed medications.  Follow-up with your cardiologist.  Call in the morning to schedule closer follow-up if possible.  You may follow-up with your primary care doctor in the interim.  Return for any new or concerning symptoms.

## 2017-11-30 ENCOUNTER — Encounter: Payer: Self-pay | Admitting: Cardiology

## 2017-11-30 DIAGNOSIS — I4892 Unspecified atrial flutter: Secondary | ICD-10-CM | POA: Diagnosis not present

## 2017-11-30 DIAGNOSIS — I480 Paroxysmal atrial fibrillation: Secondary | ICD-10-CM | POA: Diagnosis not present

## 2017-12-04 ENCOUNTER — Inpatient Hospital Stay: Payer: Medicare Other

## 2017-12-04 ENCOUNTER — Other Ambulatory Visit: Payer: Self-pay

## 2017-12-04 VITALS — BP 117/58 | HR 78 | Temp 98.2°F | Resp 18

## 2017-12-04 DIAGNOSIS — I480 Paroxysmal atrial fibrillation: Secondary | ICD-10-CM | POA: Diagnosis not present

## 2017-12-04 DIAGNOSIS — Z79899 Other long term (current) drug therapy: Secondary | ICD-10-CM | POA: Diagnosis not present

## 2017-12-04 DIAGNOSIS — D500 Iron deficiency anemia secondary to blood loss (chronic): Secondary | ICD-10-CM | POA: Diagnosis not present

## 2017-12-04 DIAGNOSIS — I4892 Unspecified atrial flutter: Secondary | ICD-10-CM | POA: Diagnosis not present

## 2017-12-04 DIAGNOSIS — D508 Other iron deficiency anemias: Secondary | ICD-10-CM

## 2017-12-04 MED ORDER — SODIUM CHLORIDE 0.9 % IV SOLN
INTRAVENOUS | Status: DC
  Administered 2017-12-04 (×2): via INTRAVENOUS
  Filled 2017-12-04: qty 250

## 2017-12-04 MED ORDER — SODIUM CHLORIDE 0.9% FLUSH
10.0000 mL | INTRAVENOUS | Status: DC | PRN
  Filled 2017-12-04: qty 10

## 2017-12-04 MED ORDER — SODIUM CHLORIDE 0.9 % IV SOLN
510.0000 mg | Freq: Once | INTRAVENOUS | Status: AC
  Administered 2017-12-04: 510 mg via INTRAVENOUS
  Filled 2017-12-04: qty 17

## 2017-12-04 MED ORDER — SODIUM CHLORIDE 0.9% FLUSH
3.0000 mL | Freq: Once | INTRAVENOUS | Status: DC | PRN
  Filled 2017-12-04: qty 10

## 2017-12-04 NOTE — Patient Instructions (Signed)

## 2017-12-04 NOTE — Progress Notes (Signed)
Patient refused to wait 30 min post infusion. Released stable and ASX.

## 2017-12-08 ENCOUNTER — Other Ambulatory Visit: Payer: Self-pay | Admitting: Student in an Organized Health Care Education/Training Program

## 2017-12-11 ENCOUNTER — Ambulatory Visit (INDEPENDENT_AMBULATORY_CARE_PROVIDER_SITE_OTHER): Payer: Medicare Other | Admitting: Physician Assistant

## 2017-12-11 ENCOUNTER — Encounter: Payer: Self-pay | Admitting: Physician Assistant

## 2017-12-11 VITALS — BP 128/62 | HR 89 | Ht 66.0 in | Wt 172.0 lb

## 2017-12-11 DIAGNOSIS — J449 Chronic obstructive pulmonary disease, unspecified: Secondary | ICD-10-CM | POA: Diagnosis not present

## 2017-12-11 DIAGNOSIS — I4819 Other persistent atrial fibrillation: Secondary | ICD-10-CM

## 2017-12-11 DIAGNOSIS — I5032 Chronic diastolic (congestive) heart failure: Secondary | ICD-10-CM | POA: Diagnosis not present

## 2017-12-11 DIAGNOSIS — Z79899 Other long term (current) drug therapy: Secondary | ICD-10-CM | POA: Diagnosis not present

## 2017-12-11 DIAGNOSIS — I481 Persistent atrial fibrillation: Secondary | ICD-10-CM

## 2017-12-11 DIAGNOSIS — I480 Paroxysmal atrial fibrillation: Secondary | ICD-10-CM | POA: Diagnosis not present

## 2017-12-11 DIAGNOSIS — I2510 Atherosclerotic heart disease of native coronary artery without angina pectoris: Secondary | ICD-10-CM | POA: Diagnosis not present

## 2017-12-11 MED ORDER — DILTIAZEM HCL ER COATED BEADS 180 MG PO CP24: 180.0000 mg | ORAL_CAPSULE | Freq: Every day | ORAL | 1 refills | Status: DC

## 2017-12-11 MED ORDER — AMIODARONE HCL 400 MG PO TABS: ORAL_TABLET | ORAL | 6 refills | Status: DC

## 2017-12-11 NOTE — Patient Instructions (Addendum)
Medication Instructions:  Change Amiodarone 200 mg- take two tablets by mouth for two weeks, then switch to one tablet daily.  If you need a refill on your cardiac medications before your next appointment, please call your pharmacy.  Labwork: TSH, Hepatic Function HERE IN OUR OFFICE AT [facility]  Take the provided lab slips with you to the lab for your blood draw.    Testing/Procedures: None Ordered.   Follow-Up: Your physician wants you to keep follow up appointment with Dr.Camnitz.   Thank you for choosing [facility] at [facility]!!

## 2017-12-11 NOTE — Progress Notes (Signed)
Cardiology Office Note   Date:  12/11/2017   ID:  [name], DOB 1940/12/22, MRN [id number]  PCP:  Trevor Bill, MD  Cardiologist: Dr. [name] EP: Dr. Kathrynn Speed Shenouda Genova, PA-C    History of Present Illness: [name] is a 77 y.o. male with a history of D-CHF, COPD, DM, HTN, HLD, PAF/flutter s/p ablation for both, GIB>>no anticoag, CP s/p cath 10/05/2017 w/ non-obs dz,   ER visits 08/11, 08/14, 08/15, 08/19, 09/03 for afib Afib Clinic visit 08/15 for A fib. Admitted 08/15-08/17 for A fib Admitted 08/19-08/21/2019 for Afib RVR  [name] presents for cardiology follow up.  He is not in afib now, can feel it in his upper chest. He has been in regular rhythm for a while.   He needs refills on the amio and Dilt.  Breathing is normal now, thinks he can walk a block. Normal activity level for him.  He uses a cane when he walks.  His activity is limited somewhat by musculoskeletal issues.  He feels his dyspnea on exertion is at baseline.  No lower extremity edema, no orthopnea or PND.  He has noted his heart rate to go up when he walks, even if he does not walk very far, his heart rate go up to 99 or so.  It comes back down when he rests.  He describes the heart rate as being elevated, but regular.  He is not getting chest pain at rest or with exertion.  Still smoking, 6-8 cigs a day.    Past Medical History:  Diagnosis Date  . Anemia    takes Ferrous Sulfate daily  . Arthritis    "all over"  . Balance problem 01/2014  . CAP (community acquired pneumonia) 09/18/2014  . Cervical radiculopathy due to degenerative joint disease of spine   . COPD (chronic obstructive pulmonary disease) ([place])   . Coronary artery disease, non-occlusive    a. 03/2010 Nonocclusive disease by cath, performed for ST elevations on ECG;  b. 06/2013 Lexi MV: EF 60%, no ischemia.  . Diabetes mellitus type II    takes Metformin and Lantus daily  . Diastolic CHF, chronic ([place])     a. 12/2012 EF 55-60%, diast dysfxn, triv MR, mildly dil LA/RA.  [name] History of blood transfusion 1982   "when I had stomach OR"  . History of bronchitis    1998  . History of gastric ulcer   . HTN (hypertension)    takes Diltiazem daily  . Hyperlipidemia    takes Pravastatin daily  . Joint pain   . PAF (paroxysmal atrial fibrillation) (HCC)    Recurrent after atrial flutter (a. 07/2010 Status post caval tricuspid isthmus ablation by Dr. Midge Aver Metoprolol daily), currently controlled on flecainide plus diltiazem and Coumadin  . Weakness    numbness and tingling both hands    Past Surgical History:  Procedure Laterality Date  . ATRIAL ABLATION SURGERY  08/05/10   CTI ablation for atrial flutter by JA  . CARDIAC CATHETERIZATION  2012   nl LV function, no occlusive CAD, PAF  . CARDIOVERSION  12/07/2010    Successful direct current cardioversion with atrial fibrillation to normal sinus rhythm  . CARPAL TUNNEL RELEASE Bilateral 01/30/2014   Procedure: BILATERAL CARPAL TUNNEL RELEASE;  Surgeon: Marianna Payment, MD;  Location: [place];  Service: Orthopedics;  Laterality: Bilateral;  . CATARACT EXTRACTION W/ INTRAOCULAR LENS  IMPLANT, BILATERAL Bilateral   . COLONOSCOPY [name] 12/02/2013   Procedure: COLONOSCOPY;  Surgeon: Irene Shipper, MD;  Location: [facility] ENDOSCOPY;  Service: Endoscopy;  Laterality: [name];  . ESOPHAGOGASTRODUODENOSCOPY [name] 09/22/2014   Procedure: ESOPHAGOGASTRODUODENOSCOPY (EGD);  Surgeon: Ronald Lobo, MD;  Location: [facility] ENDOSCOPY;  Service: Endoscopy;  Laterality: [name];  . ESOPHAGOGASTRODUODENOSCOPY [name] 07/11/2016   Procedure: ESOPHAGOGASTRODUODENOSCOPY (EGD);  Surgeon: Doran Stabler, MD;  Location: [facility] ENDOSCOPY;  Service: Endoscopy;  Laterality: [name];  . INCISION AND DRAINAGE ABSCESS / HEMATOMA OF BURSA / KNEE / THIGH Left 1998   knee  . KNEE BURSECTOMY Left 1998  . LAPAROSCOPIC CHOLECYSTECTOMY  03/2010  . LEFT HEART CATH AND CORONARY ANGIOGRAPHY [name] 10/05/2017    Procedure: LEFT HEART CATH AND CORONARY ANGIOGRAPHY;  Surgeon: Martinique, Peter M, MD;  Location: [place] CV LAB;  Service: Cardiovascular;  Laterality: [name];  . NM MYOVIEW LTD  07/22/2013   Normal EF ~60%, no ischemia or infarction.  [name] PARTIAL GASTRECTOMY  1982   subtotal; "took out 30% for ulcers"  . TRANSTHORACIC ECHOCARDIOGRAM  02/16/2014   EF 60%, no RWMA. - otherwise normal  . YAG LASER APPLICATION Bilateral     Current Outpatient Medications  Medication Sig Dispense Refill  . acetaminophen (TYLENOL) 325 MG tablet Take 325 mg by mouth every 6 (six) hours as needed for mild pain.    [name] albuterol (PROVENTIL HFA;VENTOLIN HFA) 108 (90 Base) MCG/ACT inhaler Inhale 2 puffs into the lungs every 4 (four) hours as needed for wheezing or shortness of breath. 1 Inhaler 0  . amiodarone (PACERONE) 400 MG tablet Take 1 tablet (400 mg total) by mouth daily for 14 days. 14 tablet 0  . aspirin EC 81 MG tablet Take 81 mg by mouth daily.    [name] co-enzyme Q-10 30 MG capsule Take 30 mg by mouth daily.     . diclofenac sodium (VOLTAREN) 1 % GEL Apply 4 g topically 3 (three) times daily as needed (for back pain). 1 Tube 0  . diltiazem (CARDIZEM CD) 180 MG 24 hr capsule Take 1 capsule (180 mg total) by mouth daily. 30 capsule 0  . hydrocortisone cream 1 % Apply 1 application topically 2 (two) times daily.     . Melatonin 3 MG TABS Take 1 tablet (3 mg total) by mouth at bedtime. 30 tablet 0  . metFORMIN (GLUCOPHAGE) 1000 MG tablet Take 1,000 mg by mouth 2 (two) times daily with a meal.     . Multiple Vitamin (MULTIVITAMIN WITH MINERALS) TABS tablet Take 1 tablet by mouth 2 (two) times daily.     . nitroGLYCERIN (NITROSTAT) 0.4 MG SL tablet Place 1 tablet (0.4 mg total) under the tongue every 5 (five) minutes x 3 doses as needed for chest pain. 25 tablet 9  . pantoprazole (PROTONIX) 40 MG tablet Take 1 tablet (40 mg total) by mouth 2 (two) times daily before a meal.    . polyethylene glycol (MIRALAX / GLYCOLAX)  packet Take 17 g by mouth as needed for moderate constipation. 10 each 0  . pravastatin (PRAVACHOL) 40 MG tablet Take 1 tablet (40 mg total) by mouth every evening. 90 tablet 3  . tamsulosin (FLOMAX) 0.4 MG CAPS capsule Take 0.4 mg by mouth daily.    [name] amiodarone (PACERONE) 400 MG tablet Take 1 tablet (400 mg total) by mouth 2 (two) times daily for 13 days. 27 tablet 0   No current facility-administered medications for this visit.     Allergies:   Cyclobenzaprine    Social History:  The patient  reports that he  has been smoking cigarettes. He has a 28.50 pack-year smoking history. He has never used smokeless tobacco. He reports that he does not drink alcohol or use drugs.   Family History:  The patient's family history includes Cancer in his mother; Heart attack in his brother and father.    ROS:  Please see the history of present illness. All other systems are reviewed and negative.    PHYSICAL EXAM: VS:  BP 128/62 (BP Location: Left Arm, Patient Position: Sitting, Cuff Size: Normal)   Pulse 89   Ht 5\' 6"  (1.676 m)   Wt 172 lb (78 kg)   BMI 27.76 kg/m  , BMI Body mass index is 27.76 kg/m. GEN: Well nourished, well developed, male in no acute distress  HEENT: normal for age  Neck: no JVD, no carotid bruit, no masses Cardiac: Slightly irregular rate and rhythm; no murmur, no rubs, or gallops Respiratory: Scattered rhonchi and rales bilaterally, normal work of breathing GI: soft, nontender, nondistended, + BS MS: no deformity or atrophy; no edema; distal pulses are 2+ in all 4 extremities   Skin: warm and dry, no rash Neuro:  Strength and sensation are intact Psych: euthymic mood, full affect   EKG:  EKG is ordered today. The ekg ordered today demonstrates coarse atrial fib versus atypical atrial flutter with variable AV block, heart rate 75, early re-pol noted.  No significant change from 11/27/2017   Recent Labs: 11/08/2017: B Natriuretic Peptide 267.5; TSH 2.693 11/12/2017:  Magnesium 2.0 11/21/2017: ALT 31 11/27/2017: BUN 11; Creatinine, Ser 0.99; Hemoglobin 9.9; Platelets 369; Potassium 4.0; Sodium 140    Lipid Panel    Component Value Date/Time   CHOL 82 11/09/2017 0040   TRIG 136 11/09/2017 0040   HDL 38 (L) 11/09/2017 0040   CHOLHDL 2.2 11/09/2017 0040   VLDL 27 11/09/2017 0040   LDLCALC 17 11/09/2017 0040     Wt Readings from Last 3 Encounters:  12/11/17 172 lb (78 kg)  11/21/17 170 lb 6.4 oz (77.3 kg)  11/16/17 164 lb 8 oz (74.6 kg)     Other studies Reviewed: Additional studies/ records that were reviewed today include: Office notes, hospital records and testing.  ASSESSMENT AND PLAN:  1.  Atrial flutter: It is atypical.  His heart rate is controlled, except when he exerts himself. -I advised him that our heart rate of approximately 100 when he is exerting himself is okay.  With a systolic blood pressure 993, I am reluctant to increase the Cardizem. - I communicated with Dr. Curt Bears who recommended decreasing the amiodarone down to 200 mg twice daily for 2 weeks and then 200 mg daily - Since he has not converted, keep appointment with Dr. Curt Bears later this week. - Upon reviewing the labs, TSH and hepatic function profile have not been performed, because the amiodarone, he needs this. -Pulmonary function testing is also needed, this is not on his AVS but he will be called about this.  2. Chronic diastolic CHF: Although his weight is up, he has no significant volume overload by exam.  He is not currently on a diuretic.  Continue to follow this, add a diuretic if needed.  3.  Tobacco use: Smoking cessation encouraged but it is doubtful.   Current medicines are reviewed at length with the patient today.  The patient has concerns regarding medicines.  Concerns were addressed  The following changes have been made: No change  Labs/ tests ordered today include:   Orders Placed This Encounter  Procedures  . TSH  . Hepatic function panel  .  EKG 12-Lead  . Pulmonary Function Test     Disposition:   FU with Dr. [name] and Dr. Curt Bears  Signed, Rosaria Ferries, PA-C  12/11/2017 1:49 PM    [place] Phone: [phone]; Fax: [phone]  This note was written with the assistance of speech recognition software. Please excuse any transcriptional errors.

## 2017-12-12 DIAGNOSIS — I480 Paroxysmal atrial fibrillation: Secondary | ICD-10-CM | POA: Diagnosis not present

## 2017-12-12 DIAGNOSIS — I4892 Unspecified atrial flutter: Secondary | ICD-10-CM | POA: Diagnosis not present

## 2017-12-12 LAB — HEPATIC FUNCTION PANEL
ALBUMIN: 4 g/dL (ref 3.5–4.8)
ALK PHOS: 73 IU/L (ref 39–117)
ALT: 45 IU/L — ABNORMAL HIGH (ref 0–44)
AST: 31 IU/L (ref 0–40)
BILIRUBIN, DIRECT: 0.06 mg/dL (ref 0.00–0.40)
Total Protein: 6.1 g/dL (ref 6.0–8.5)

## 2017-12-12 LAB — TSH: TSH: 5.1 u[IU]/mL — ABNORMAL HIGH (ref 0.450–4.500)

## 2017-12-12 IMAGING — CT CT ABD-PELV W/ CM
2 of 5 series · 16 of 46 positions shown, 18 images · IV contrast (APPLIED)
Comparison: 07/10/2016

CLINICAL DATA: Intermittent bloody stools over the past year. Left
lower quadrant pain. Decreased hemoglobin. Recent left buttock
abscess drainage. Partial gastrectomy secondary to chronic blood
loss. Rule out diverticulitis or diverticulosis.

EXAM:
CT ABDOMEN AND PELVIS WITH CONTRAST
TECHNIQUE: Multidetector CT imaging of the abdomen and pelvis was performed
using the standard protocol following bolus administration of
intravenous contrast.
CONTRAST:  100mL DQEUC3-ISS IOPAMIDOL (DQEUC3-ISS) INJECTION 61%

[Series 3: abdomen 5.0 · axial · 0.75mm/px · z∈[+776,+1166]mm · 13 of 90 slices shown, 15 images]
[im 6/90  soft-tissue]
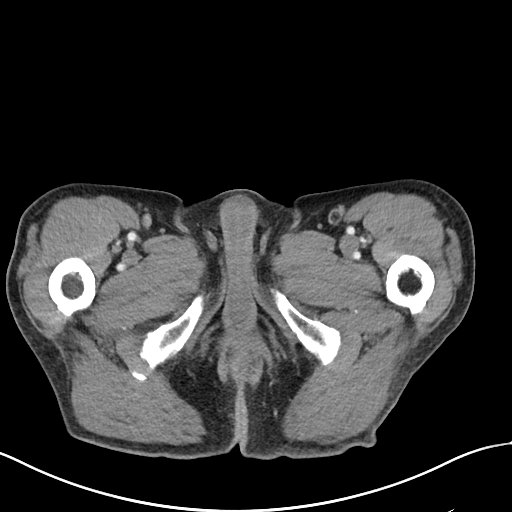
[im 6/90  bone]
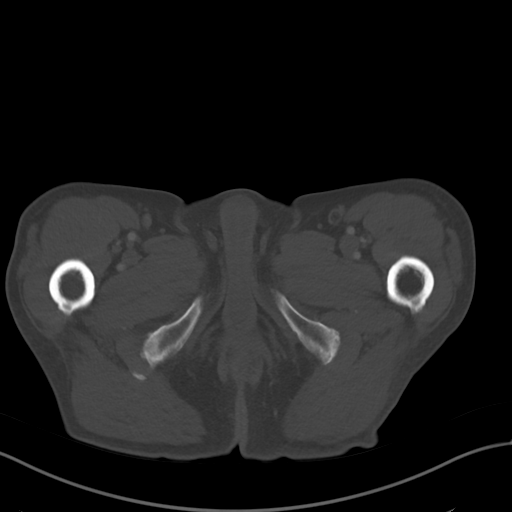
[im 11/90  soft-tissue]
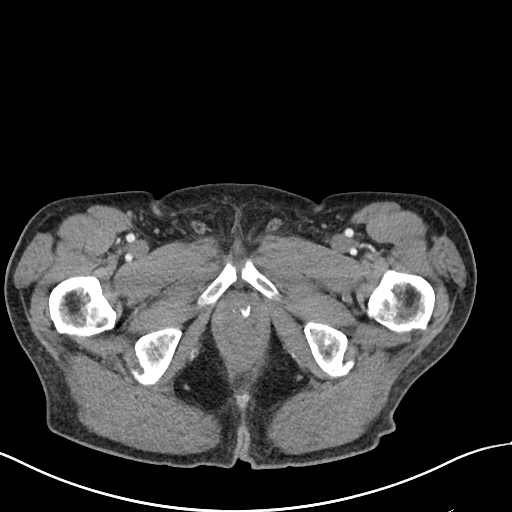
[im 21/90  soft-tissue]
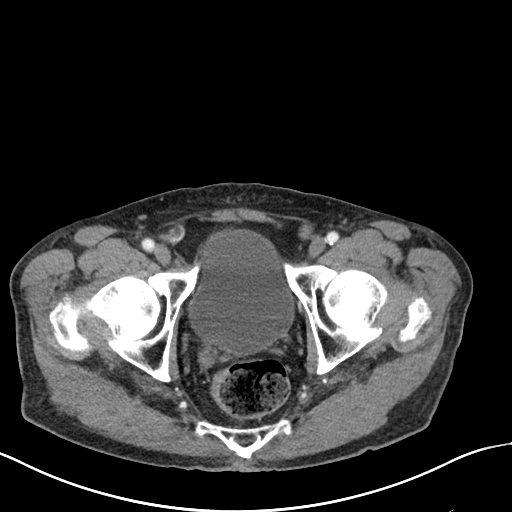
[im 27/90  soft-tissue]
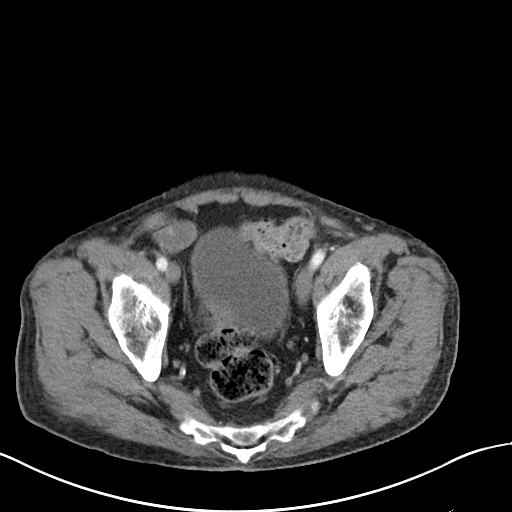
[im 32/90  soft-tissue]
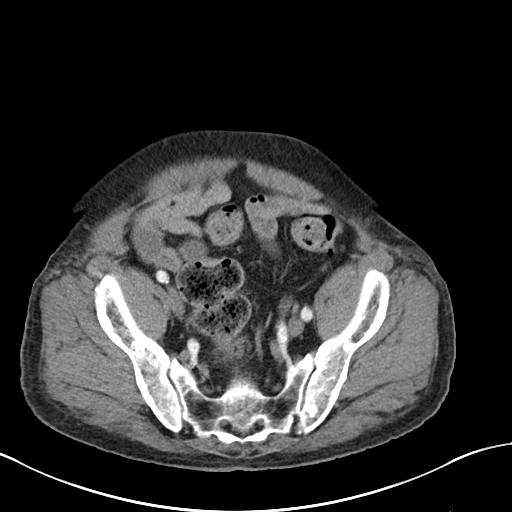
[im 37/90  soft-tissue]
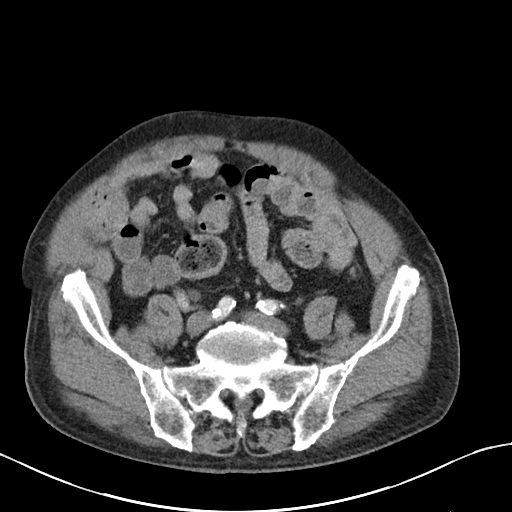
[im 48/90  soft-tissue]
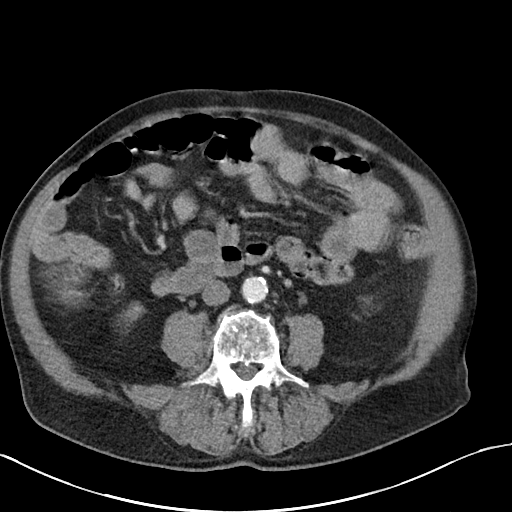
[im 53/90  soft-tissue]
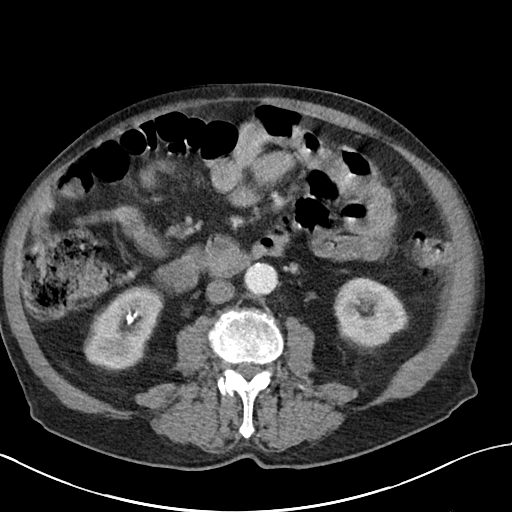
[im 58/90  soft-tissue]
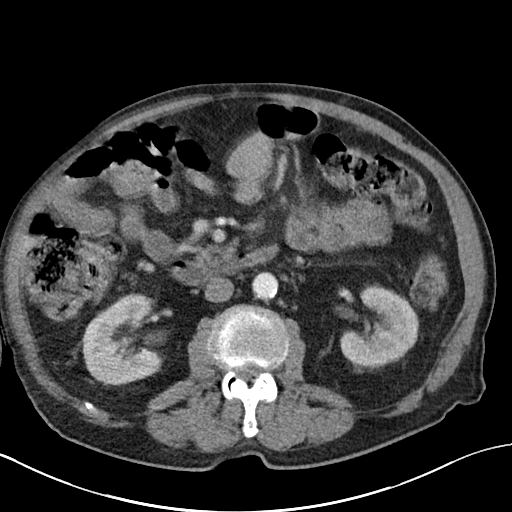
[im 58/90  bone]
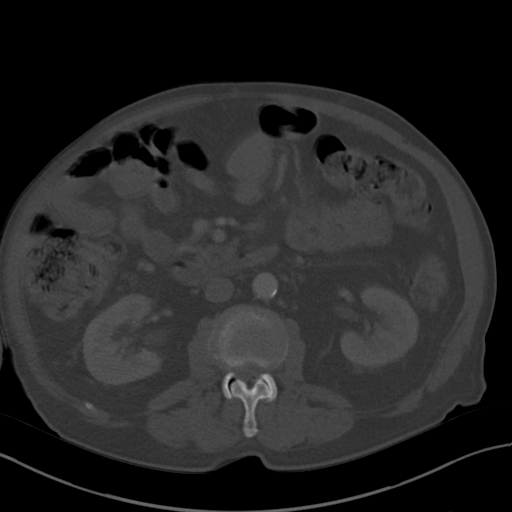
[im 63/90  soft-tissue]
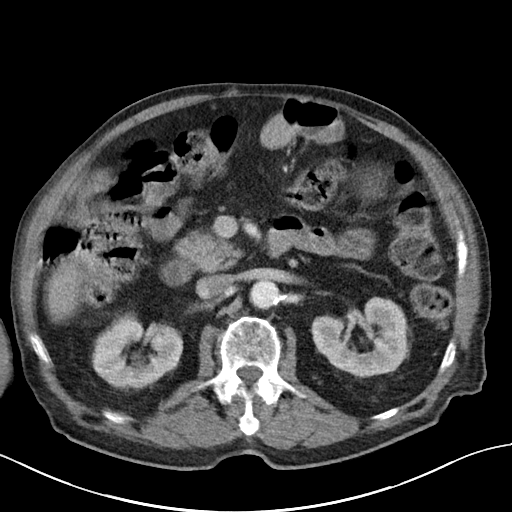
[im 69/90  soft-tissue]
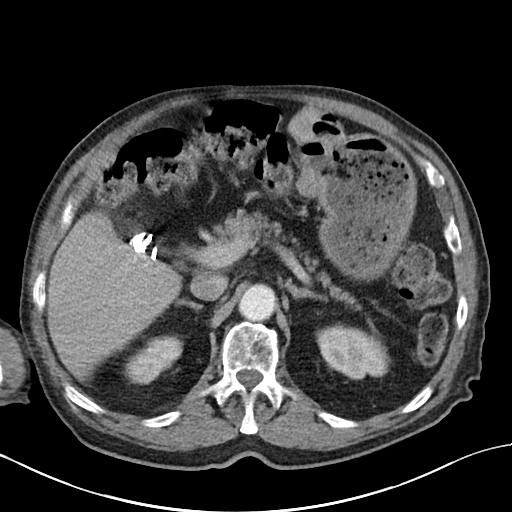
[im 79/90  soft-tissue]
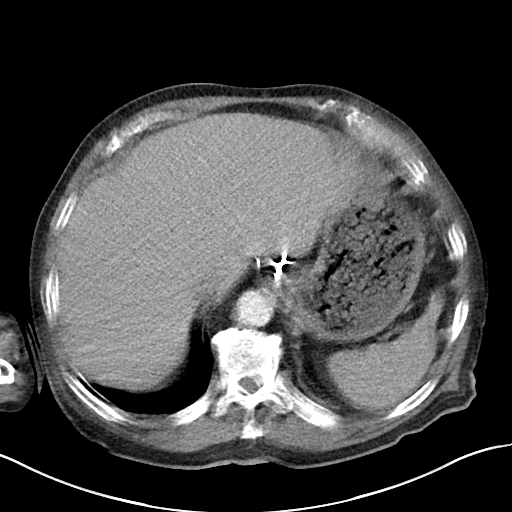
[im 84/90  soft-tissue]
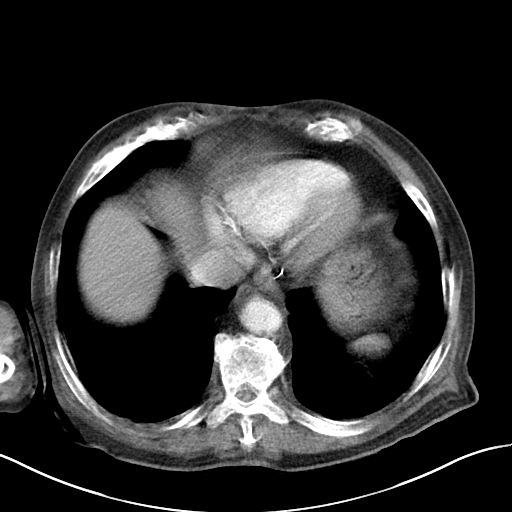

[Series 6: abdomen 3.0 mpr cor · coronal · 0.82mm/px · 3 of 105 slices shown]
[im 35/105  soft-tissue]
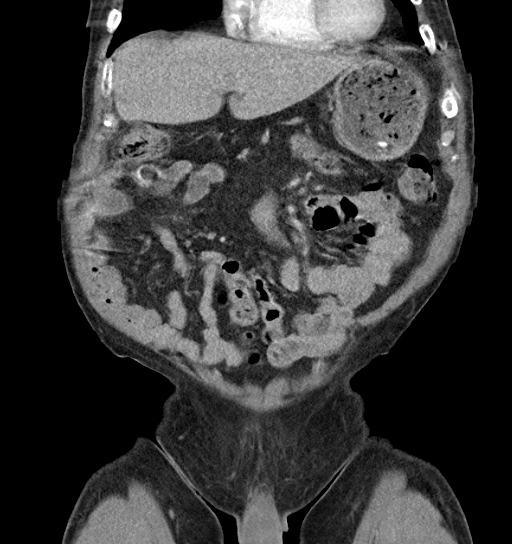
[im 47/105  soft-tissue]
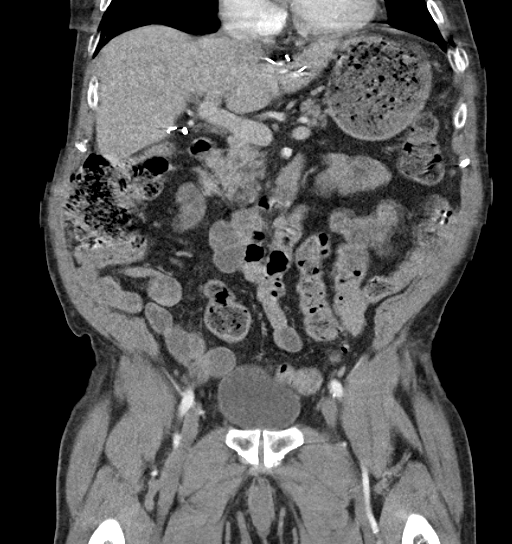
[im 58/105  soft-tissue]
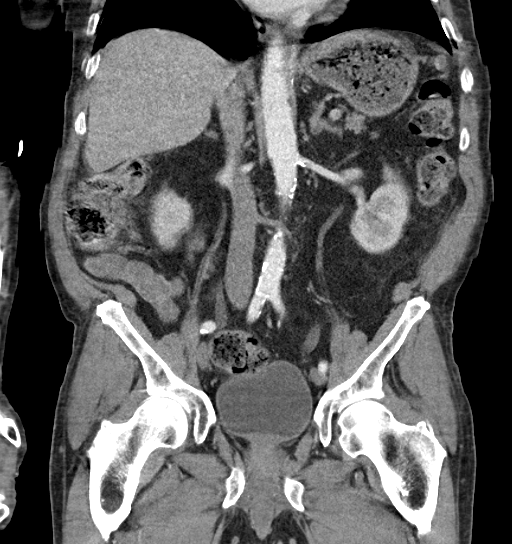

[16 of 46 positions shown; findings below may reference images not displayed]

FINDINGS: Mild to moderate motion degradation throughout.

Lower chest: Clear lung bases. Mild cardiomegaly with right coronary
artery atherosclerosis. Surgical changes at the gastroesophageal
junction.

Hepatobiliary: Normal liver. Cholecystectomy, without biliary ductal
dilatation.

Pancreas: Mild to moderate pancreatic atrophy, without duct
dilatation or acute inflammation.

Spleen: Normal in size, without focal abnormality.

Adrenals/Urinary Tract: Normal adrenal glands. Lower pole right
renal collecting system stone or stones on the order of 4 mm. Normal
left kidney, without hydronephrosis. Normal urinary bladder.

Stomach/Bowel: Status post partial gastrectomy and
gastrojejunostomy. Colonic stool burden suggests constipation.
Scattered colonic diverticula. No evidence of diverticulitis. Normal
terminal ileum. Appendix grossly normal but suboptimally assessed
evaluated secondary to motion. Normal small bowel.

Vascular/Lymphatic: Aortic and branch vessel atherosclerosis. No
abdominopelvic adenopathy.

Reproductive: Mild prostatomegaly.

Other: Subcutaneous thickening about the left gluteal crease,
including on image 90/ series 3. Incompletely imaged.

Musculoskeletal: Disc bulges at L3-4 and L2-3.
IMPRESSION: 1. Mild-to-moderate motion degraded exam.
2.  Possible constipation.
3. Scattered diverticulosis without evidence of diverticulitis.
4. Right nephrolithiasis.
5. Coronary artery atherosclerosis. Aortic Atherosclerosis
([id number]).
6. Nonspecific soft tissue thickening about the left gluteal crease.
Likely the site of reported abscess drainage. This is incompletely
imaged and should be correlated with physical exam.

## 2017-12-13 DIAGNOSIS — Z79899 Other long term (current) drug therapy: Secondary | ICD-10-CM | POA: Diagnosis not present

## 2017-12-13 DIAGNOSIS — Z794 Long term (current) use of insulin: Secondary | ICD-10-CM | POA: Diagnosis not present

## 2017-12-13 DIAGNOSIS — I10 Essential (primary) hypertension: Secondary | ICD-10-CM | POA: Diagnosis not present

## 2017-12-13 DIAGNOSIS — Z23 Encounter for immunization: Secondary | ICD-10-CM | POA: Diagnosis not present

## 2017-12-13 DIAGNOSIS — G8929 Other chronic pain: Secondary | ICD-10-CM | POA: Diagnosis not present

## 2017-12-13 DIAGNOSIS — E119 Type 2 diabetes mellitus without complications: Secondary | ICD-10-CM | POA: Diagnosis not present

## 2017-12-13 DIAGNOSIS — Z7984 Long term (current) use of oral hypoglycemic drugs: Secondary | ICD-10-CM | POA: Diagnosis not present

## 2017-12-13 DIAGNOSIS — F17210 Nicotine dependence, cigarettes, uncomplicated: Secondary | ICD-10-CM | POA: Diagnosis not present

## 2017-12-13 DIAGNOSIS — M7918 Myalgia, other site: Secondary | ICD-10-CM | POA: Diagnosis not present

## 2017-12-13 DIAGNOSIS — E782 Mixed hyperlipidemia: Secondary | ICD-10-CM | POA: Diagnosis not present

## 2017-12-13 DIAGNOSIS — I481 Persistent atrial fibrillation: Secondary | ICD-10-CM | POA: Diagnosis not present

## 2017-12-13 NOTE — Progress Notes (Signed)
Electrophysiology Office Note   Date:  12/14/2017   ID:  [name], DOB [birth year], MRN [id number]  PCP:  [name], MD  Cardiologist:  [name] Primary Electrophysiologist:  Will Meredith Leeds, MD    No chief complaint on file.    History of Present Illness: [name] is a 77 y.o. male who is being seen today for the evaluation of atrial flutter at the request of Rosaria Ferries. Presenting today for electrophysiology evaluation.  He has a history of diastolic heart failure, COPD, diabetes, hypertension, hyperlipidemia, paroxysmal atrial fibrillation and atrial flutter status post ablation, GI bleeding not anticoagulated.  He had a catheterization 10/05/2017 with nonobstructive coronary disease.  He has had multiple emergency room visits for atrial fibrillation.  Unfortunately he is not anticoagulated at this moment.  He has not had to return to the emergency room since being hospitalized.  He has been well controlled on the 180 mg of diltiazem.     Today, he denies symptoms of palpitations, chest pain, shortness of breath, orthopnea, PND, lower extremity edema, claudication, dizziness, presyncope, syncope, bleeding, or neurologic sequela. The patient is tolerating medications without difficulties.    Past Medical History:  Diagnosis Date  . Anemia    takes Ferrous Sulfate daily  . Arthritis    "all over"  . Balance problem 01/2014  . CAP (community acquired pneumonia) 09/18/2014  . Cervical radiculopathy due to degenerative joint disease of spine   . COPD (chronic obstructive pulmonary disease) ([place])   . Coronary artery disease, non-occlusive    a. 03/2010 Nonocclusive disease by cath, performed for ST elevations on ECG;  b. 06/2013 Lexi MV: EF 60%, no ischemia.  . Diabetes mellitus type II    takes Metformin and Lantus daily  . Diastolic CHF, chronic ([place])    a. 12/2012 EF 55-60%, diast dysfxn, triv MR, mildly dil LA/RA.  [name] History of blood transfusion  1982   "when I had stomach OR"  . History of bronchitis    1998  . History of gastric ulcer   . HTN (hypertension)    takes Diltiazem daily  . Hyperlipidemia    takes Pravastatin daily  . Joint pain   . PAF (paroxysmal atrial fibrillation) ([facility])    Recurrent after atrial flutter (a. 07/2010 Status post caval tricuspid isthmus ablation by Dr. Midge Aver Metoprolol daily), currently controlled on flecainide plus diltiazem and Coumadin  . Weakness    numbness and tingling both hands   Past Surgical History:  Procedure Laterality Date  . ATRIAL ABLATION SURGERY  08/05/10   CTI ablation for atrial flutter by JA  . CARDIAC CATHETERIZATION  2012   nl LV function, no occlusive CAD, PAF  . CARDIOVERSION  12/07/2010    Successful direct current cardioversion with atrial fibrillation to normal sinus rhythm  . CARPAL TUNNEL RELEASE Bilateral 01/30/2014   Procedure: BILATERAL CARPAL TUNNEL RELEASE;  Surgeon: Marianna Payment, MD;  Location: [place];  Service: Orthopedics;  Laterality: Bilateral;  . CATARACT EXTRACTION W/ INTRAOCULAR LENS  IMPLANT, BILATERAL Bilateral   . COLONOSCOPY [name] 12/02/2013   Procedure: COLONOSCOPY;  Surgeon: Irene Shipper, MD;  Location: [place];  Service: Endoscopy;  Laterality: [name];  . ESOPHAGOGASTRODUODENOSCOPY [name] 09/22/2014   Procedure: ESOPHAGOGASTRODUODENOSCOPY (EGD);  Surgeon: Ronald Lobo, MD;  Location[facility] ENDOSCOPY;  Service: Endoscopy;  Laterality: [name];  . ESOPHAGOGASTRODUODENOSCOPY [name] 07/11/2016   Procedure: ESOPHAGOGASTRODUODENOSCOPY (EGD);  Surgeon: Doran Stabler, MD;  Location: [facility] ENDOSCOPY;  Service: Endoscopy;  Laterality:  [name];  . INCISION AND DRAINAGE ABSCESS / HEMATOMA OF BURSA / KNEE / THIGH Left 1998   knee  . KNEE BURSECTOMY Left 1998  . LAPAROSCOPIC CHOLECYSTECTOMY  03/2010  . LEFT HEART CATH AND CORONARY ANGIOGRAPHY [name] 10/05/2017   Procedure: LEFT HEART CATH AND CORONARY ANGIOGRAPHY;  Surgeon: Martinique, Peter M, MD;  Location: [place]  CV LAB;  Service: Cardiovascular;  Laterality: [name];  . NM MYOVIEW LTD  07/22/2013   Normal EF ~60%, no ischemia or infarction.  [name] PARTIAL GASTRECTOMY  1982   subtotal; "took out 30% for ulcers"  . TRANSTHORACIC ECHOCARDIOGRAM  02/16/2014   EF 60%, no RWMA. - otherwise normal  . YAG LASER APPLICATION Bilateral      Current Outpatient Medications  Medication Sig Dispense Refill  . acetaminophen (TYLENOL) 325 MG tablet Take 325 mg by mouth every 6 (six) hours as needed for mild pain.    [name] albuterol (PROVENTIL HFA;VENTOLIN HFA) 108 (90 Base) MCG/ACT inhaler Inhale 2 puffs into the lungs every 4 (four) hours as needed for wheezing or shortness of breath. 1 Inhaler 0  . amiodarone (PACERONE) 400 MG tablet Take 1 tablet (400 mg total) by mouth 2 (two) times daily for 13 days. 27 tablet 0  . amiodarone (PACERONE) 400 MG tablet Take two tablets by mouth for two weeks, then switch to one tablet daily. 45 tablet 6  . aspirin EC 81 MG tablet Take 81 mg by mouth daily.    [name] co-enzyme Q-10 30 MG capsule Take 30 mg by mouth daily.     . diclofenac sodium (VOLTAREN) 1 % GEL Apply 4 g topically 3 (three) times daily as needed (for back pain). 1 Tube 0  . diltiazem (CARDIZEM CD) 180 MG 24 hr capsule Take 1 capsule (180 mg total) by mouth daily. 90 capsule 1  . hydrocortisone cream 1 % Apply 1 application topically 2 (two) times daily.     . Melatonin 3 MG TABS Take 1 tablet (3 mg total) by mouth at bedtime. 30 tablet 0  . metFORMIN (GLUCOPHAGE) 1000 MG tablet Take 1,000 mg by mouth 2 (two) times daily with a meal.     . Multiple Vitamin (MULTIVITAMIN WITH MINERALS) TABS tablet Take 1 tablet by mouth 2 (two) times daily.     . nitroGLYCERIN (NITROSTAT) 0.4 MG SL tablet Place 1 tablet (0.4 mg total) under the tongue every 5 (five) minutes x 3 doses as needed for chest pain. 25 tablet 9  . pantoprazole (PROTONIX) 40 MG tablet Take 1 tablet (40 mg total) by mouth 2 (two) times daily before a meal.    .  polyethylene glycol (MIRALAX / GLYCOLAX) packet Take 17 g by mouth as needed for moderate constipation. 10 each 0  . pravastatin (PRAVACHOL) 40 MG tablet Take 1 tablet (40 mg total) by mouth every evening. 90 tablet 3  . tamsulosin (FLOMAX) 0.4 MG CAPS capsule Take 0.4 mg by mouth daily.     No current facility-administered medications for this visit.     Allergies:   Cyclobenzaprine   Social History:  The patient  reports that he has been smoking cigarettes. He has a 28.50 pack-year smoking history. He has never used smokeless tobacco. He reports that he does not drink alcohol or use drugs.   Family History:  The patient's family history includes Cancer in his mother; Heart attack in his brother and father.    ROS:  Please see the history of present illness.   Otherwise,  review of systems is positive for palpitations, dyspnea on exertion, back pain.   All other systems are reviewed and negative.    PHYSICAL EXAM: VS:  BP 124/64   Pulse 81   Ht 5\' 6"  (1.676 m)   Wt 180 lb (81.6 kg)   SpO2 99%   BMI 29.05 kg/m  , BMI Body mass index is 29.05 kg/m. GEN: Well nourished, well developed, in no acute distress  HEENT: normal  Neck: no JVD, carotid bruits, or masses Cardiac: iRRR; no murmurs, rubs, or gallops,no edema  Respiratory:  clear to auscultation bilaterally, normal work of breathing GI: soft, nontender, nondistended, + BS MS: no deformity or atrophy  Skin: warm and dry Neuro:  Strength and sensation are intact Psych: euthymic mood, full affect  EKG:  EKG is not ordered today. Personal review of the ekg ordered 12/11/17 shows atrial flutter, rate is 75, early repolarization  Recent Labs: 11/08/2017: B Natriuretic Peptide 267.5 11/12/2017: Magnesium 2.0 11/27/2017: BUN 11; Creatinine, Ser 0.99; Hemoglobin 9.9; Platelets 369; Potassium 4.0; Sodium 140 12/11/2017: ALT 45; TSH 5.100    Lipid Panel     Component Value Date/Time   CHOL 82 11/09/2017 0040   TRIG 136 11/09/2017  0040   HDL 38 (L) 11/09/2017 0040   CHOLHDL 2.2 11/09/2017 0040   VLDL 27 11/09/2017 0040   LDLCALC 17 11/09/2017 0040     Wt Readings from Last 3 Encounters:  12/14/17 180 lb (81.6 kg)  12/11/17 172 lb (78 kg)  11/21/17 170 lb 6.4 oz (77.3 kg)      Other studies Reviewed: Additional studies/ records that were reviewed today include: LHC 10/05/17  Review of the above records today demonstrates:   Dist RCA lesion is 40% stenosed.  Ost 1st Sept lesion is 95% stenosed.  LV end diastolic pressure is normal.   1. Single vessel CAD involving the ostium of the first septal perforator. Otherwise nonobstructive disease. 2. Normal LVEDP  TTE 10/05/17 - Left ventricle: The cavity size was normal. Wall thickness was   increased in a pattern of mild LVH. Systolic function was normal.   The estimated ejection fraction was in the range of 55% to 60%. - Aortic valve: There was trivial regurgitation. - Mitral valve: There was mild regurgitation. - Left atrium: The atrium was mildly dilated.  ASSESSMENT AND PLAN:  1.  Atypical atrial flutter/atrial fibrillation: Currently not anticoagulated.  Has stable blood pressures.  Is currently on amiodarone.  Since he cannot be anticoagulated and he is in atrial flutter and feeling well, we will plan to stop amiodarone today.  We will also discuss anticoagulation with his gastroenterologist.  I also told him that he would likely need a pacemaker if he has more rapid atrial fibrillation.  2.  Chronic diastolic heart failure: No signs of volume overload  3.  Tobacco abuse: Cessation encouraged    Current medicines are reviewed at length with the patient today.   The patient does not have concerns regarding his medicines.  The following changes were made today: Stop amiodarone  Labs/ tests ordered today include:  No orders of the defined types were placed in this encounter.    Disposition:   FU with Will Camnitz 3 months  Signed, Will Meredith Leeds, MD  12/14/2017 8:35 AM     [facility] [address] [place] [phone] (office) [phone] (fax)

## 2017-12-14 ENCOUNTER — Ambulatory Visit (INDEPENDENT_AMBULATORY_CARE_PROVIDER_SITE_OTHER): Payer: Medicare Other | Admitting: Cardiology

## 2017-12-14 ENCOUNTER — Encounter: Payer: Self-pay | Admitting: Cardiology

## 2017-12-14 VITALS — BP 124/64 | HR 81 | Ht 66.0 in | Wt 180.0 lb

## 2017-12-14 DIAGNOSIS — I5032 Chronic diastolic (congestive) heart failure: Secondary | ICD-10-CM | POA: Diagnosis not present

## 2017-12-14 DIAGNOSIS — I2510 Atherosclerotic heart disease of native coronary artery without angina pectoris: Secondary | ICD-10-CM

## 2017-12-14 DIAGNOSIS — I481 Persistent atrial fibrillation: Secondary | ICD-10-CM

## 2017-12-14 DIAGNOSIS — I4819 Other persistent atrial fibrillation: Secondary | ICD-10-CM

## 2017-12-14 NOTE — Patient Instructions (Addendum)
Medication Instructions:  Your physician has recommended you make the following change in your medication: 1. STOP Amiodarone  * If you need a refill on your cardiac medications before your next appointment, please call your pharmacy.   Labwork: None ordered  Testing/Procedures: None ordered  Follow-Up: Your physician recommends that you schedule a follow-up appointment in: 3 months with Dr. Curt Bears.  Thank you for choosing [facility]!!   Trinidad Curet, RN [phone]

## 2017-12-19 ENCOUNTER — Other Ambulatory Visit: Payer: Self-pay | Admitting: Family Medicine

## 2017-12-24 ENCOUNTER — Emergency Department (HOSPITAL_COMMUNITY)
Admission: EM | Admit: 2017-12-24 | Discharge: 2017-12-24 | Disposition: A | Discharge status: Home / Self Care | Payer: Medicare Other | Attending: Emergency Medicine | Admitting: Emergency Medicine

## 2017-12-24 ENCOUNTER — Encounter (HOSPITAL_COMMUNITY): Payer: Self-pay | Admitting: Emergency Medicine

## 2017-12-24 ENCOUNTER — Other Ambulatory Visit: Payer: Self-pay

## 2017-12-24 DIAGNOSIS — R21 Rash and other nonspecific skin eruption: Secondary | ICD-10-CM | POA: Diagnosis not present

## 2017-12-24 DIAGNOSIS — Z7982 Long term (current) use of aspirin: Secondary | ICD-10-CM | POA: Diagnosis not present

## 2017-12-24 DIAGNOSIS — Z7984 Long term (current) use of oral hypoglycemic drugs: Secondary | ICD-10-CM | POA: Diagnosis not present

## 2017-12-24 DIAGNOSIS — E119 Type 2 diabetes mellitus without complications: Secondary | ICD-10-CM | POA: Insufficient documentation

## 2017-12-24 DIAGNOSIS — Z79899 Other long term (current) drug therapy: Secondary | ICD-10-CM | POA: Insufficient documentation

## 2017-12-24 DIAGNOSIS — I5032 Chronic diastolic (congestive) heart failure: Secondary | ICD-10-CM | POA: Insufficient documentation

## 2017-12-24 DIAGNOSIS — L539 Erythematous condition, unspecified: Secondary | ICD-10-CM | POA: Diagnosis present

## 2017-12-24 DIAGNOSIS — I2510 Atherosclerotic heart disease of native coronary artery without angina pectoris: Secondary | ICD-10-CM | POA: Insufficient documentation

## 2017-12-24 DIAGNOSIS — I110 Hypertensive heart disease with heart failure: Secondary | ICD-10-CM | POA: Diagnosis not present

## 2017-12-24 DIAGNOSIS — J449 Chronic obstructive pulmonary disease, unspecified: Secondary | ICD-10-CM | POA: Insufficient documentation

## 2017-12-24 MED ORDER — HYDROCORTISONE 1 % EX CREA: TOPICAL_CREAM | CUTANEOUS | 0 refills | Status: DC

## 2017-12-24 NOTE — ED Provider Notes (Signed)
[facility] EMERGENCY DEPARTMENT Provider Note   CSN: [id number] Arrival date & time: 12/24/17  2000     History   Chief Complaint Chief Complaint  Patient presents with  . Facial burning    HPI [name] is a 77 y.o. male with past medical history as below who presents emergency department today for redness on face.  Patient reports that after shaving he has noticed redness to his face that is burning and itching.  He reports he is tried Jurgens lotion for this without any relief.  He notes that his symptoms resolved over time but have returned over the last 1 month each time he shaves.  Most recent episode was today.  He denies any eye involvement.  No unilateral rash.  No involvement of hands, arms, chest, back or lower extremities.  He denies any visual changes or eye pain. Denies fever, chills, contacts with persons with similar rash, or any changes in lotions/soaps/detergents, exposure to animal or plant irritants, and denies swelling or purulent discharge. No new medications. No recent travel. No recent tick bites. No involvement to palms/soles or between webspaces. No wheezing, abdominal cramping, N/V, difficulty breathing or swallowing.   HPI  Past Medical History:  Diagnosis Date  . Anemia    takes Ferrous Sulfate daily  . Arthritis    "all over"  . Balance problem 01/2014  . CAP (community acquired pneumonia) 09/18/2014  . Cervical radiculopathy due to degenerative joint disease of spine   . COPD (chronic obstructive pulmonary disease) ([place])   . Coronary artery disease, non-occlusive    a. 03/2010 Nonocclusive disease by cath, performed for ST elevations on ECG;  b. 06/2013 Lexi MV: EF 60%, no ischemia.  . Diabetes mellitus type II    takes Metformin and Lantus daily  . Diastolic CHF, chronic ([place])    a. 12/2012 EF 55-60%, diast dysfxn, triv MR, mildly dil LA/RA.  [name] History of blood transfusion 1982   "when I had stomach OR"  . History of  bronchitis    1998  . History of gastric ulcer   . HTN (hypertension)    takes Diltiazem daily  . Hyperlipidemia    takes Pravastatin daily  . Joint pain   . PAF (paroxysmal atrial fibrillation) ([facility])    Recurrent after atrial flutter (a. 07/2010 Status post caval tricuspid isthmus ablation by Dr. Midge Aver Metoprolol daily), currently controlled on flecainide plus diltiazem and Coumadin  . Weakness    numbness and tingling both hands    Patient Active Problem List   Diagnosis Date Noted  . Atrial fibrillation with RVR ([place]) 11/08/2017  . Chest pain with moderate risk of acute coronary syndrome 10/04/2017  . HTN (hypertension) 05/21/2017  . Diabetes mellitus type II 05/21/2017  . Diastolic CHF, chronic ([place]) 05/21/2017  . COPD (chronic obstructive pulmonary disease) ([place]) 05/21/2017  . H/O atrial flutter 05/21/2017  . Coronary disease/nonobstructive 05/21/2017  . Acute blood loss anemia 10/02/2016  . Abdominal pain 10/02/2016  . Constipation 10/02/2016  . Left lower quadrant pain   . Coagulopathy ([place])   . Heme positive stool   . Symptomatic anemia 07/10/2016  . Syncope 05/15/2015  . Cervical disc disease 05/15/2015  . Abnormal urinalysis 05/15/2015  . Anemia 05/15/2015  . History of CVA (cerebrovascular accident) 04/03/2014  . Near syncope 02/15/2014  . Pre-syncope 02/15/2014  . Benign neoplasm of rectum and anal canal 12/02/2013  . Upper GI bleeding 11/30/2013  . Tobacco use disorder 11/30/2013  .  Anticoagulation goal of INR 2 to 3, for PAF - CHA2DS2Vasc = 7 08/15/2013  . Type 2 diabetes mellitus ([place]) 01/22/2013  . Tobacco abuse counseling 12/15/2012  . Hyperlipidemia   . Obesity (BMI 30-39.9) 12/14/2012  . Persistent atrial fibrillation ([place]) 10/07/2010  . Atrial flutter - status post CTI ablation 07/29/2010  . Essential hypertension 07/29/2010  . Chronic diastolic congestive heart failure ([place]) 07/29/2010  . Coronary artery disease, non-occlusive 07/29/2010     Past Surgical History:  Procedure Laterality Date  . ATRIAL ABLATION SURGERY  08/05/10   CTI ablation for atrial flutter by JA  . CARDIAC CATHETERIZATION  2012   nl LV function, no occlusive CAD, PAF  . CARDIOVERSION  12/07/2010    Successful direct current cardioversion with atrial fibrillation to normal sinus rhythm  . CARPAL TUNNEL RELEASE Bilateral 01/30/2014   Procedure: BILATERAL CARPAL TUNNEL RELEASE;  Surgeon: Marianna Payment, MD;  Location: [facility];  Service: Orthopedics;  Laterality: Bilateral;  . CATARACT EXTRACTION W/ INTRAOCULAR LENS  IMPLANT, BILATERAL Bilateral   . COLONOSCOPY [name] 12/02/2013   Procedure: COLONOSCOPY;  Surgeon: Irene Shipper, MD;  Location: [facility];  Service: Endoscopy;  Laterality: [name];  . ESOPHAGOGASTRODUODENOSCOPY [name] 09/22/2014   Procedure: ESOPHAGOGASTRODUODENOSCOPY (EGD);  Surgeon: Ronald Lobo, MD;  Location: [facility] ENDOSCOPY;  Service: Endoscopy;  Laterality: [name];  . ESOPHAGOGASTRODUODENOSCOPY [name] 07/11/2016   Procedure: ESOPHAGOGASTRODUODENOSCOPY (EGD);  Surgeon: Doran Stabler, MD;  Location: [facility] ENDOSCOPY;  Service: Endoscopy;  Laterality: [name];  . INCISION AND DRAINAGE ABSCESS / HEMATOMA OF BURSA / KNEE / THIGH Left 1998   knee  . KNEE BURSECTOMY Left 1998  . LAPAROSCOPIC CHOLECYSTECTOMY  03/2010  . LEFT HEART CATH AND CORONARY ANGIOGRAPHY [name] 10/05/2017   Procedure: LEFT HEART CATH AND CORONARY ANGIOGRAPHY;  Surgeon: Martinique, Peter M, MD;  Location: [facility];  Service: Cardiovascular;  Laterality: [name];  . NM MYOVIEW LTD  07/22/2013   Normal EF ~60%, no ischemia or infarction.  [name] PARTIAL GASTRECTOMY  1982   subtotal; "took out 30% for ulcers"  . TRANSTHORACIC ECHOCARDIOGRAM  02/16/2014   EF 60%, no RWMA. - otherwise normal  . YAG LASER APPLICATION Bilateral         Home Medications    Prior to Admission medications   Medication Sig Start Date End Date Taking? Authorizing Provider  acetaminophen (TYLENOL) 325 MG tablet Take  325 mg by mouth every 6 (six) hours as needed for mild pain.    [provider]  albuterol (PROVENTIL HFA;VENTOLIN HFA) 108 (90 Base) MCG/ACT inhaler Inhale 2 puffs into the lungs every 4 (four) hours as needed for wheezing or shortness of breath. 10/30/17   Palumbo, April, MD  aspirin EC 81 MG tablet Take 81 mg by mouth daily.    [provider]  co-enzyme Q-10 30 MG capsule Take 30 mg by mouth daily.     [provider]  diclofenac sodium (VOLTAREN) 1 % GEL Apply 4 g topically 3 (three) times daily as needed (for back pain). 11/10/17   Rory Percy, DO  diltiazem (CARDIZEM CD) 180 MG 24 hr capsule Take 1 capsule (180 mg total) by mouth daily. 12/11/17   Barrett, Evelene Croon, PA-C  hydrocortisone cream 1 % Apply 1 application topically 2 (two) times daily.  01/02/17   [provider]  Melatonin 3 MG TABS Take 1 tablet (3 mg total) by mouth at bedtime. 11/16/17   Anderson, Chelsey L, DO  metFORMIN (GLUCOPHAGE) 1000 MG tablet  Take 1,000 mg by mouth 2 (two) times daily with a meal.     [provider]  Multiple Vitamin (MULTIVITAMIN WITH MINERALS) TABS tablet Take 1 tablet by mouth 2 (two) times daily.     [provider]  nitroGLYCERIN (NITROSTAT) 0.4 MG SL tablet Place 1 tablet (0.4 mg total) under the tongue every 5 (five) minutes x 3 doses as needed for chest pain. 11/19/15   Erlene Quan, PA-C  pantoprazole (PROTONIX) 40 MG tablet Take 1 tablet (40 mg total) by mouth 2 (two) times daily before a meal. 11/30/16   Domenic Polite, MD  polyethylene glycol [facility] / Floria Raveling) packet Take 17 g by mouth as needed for moderate constipation. 11/10/17   Rory Percy, DO  pravastatin (PRAVACHOL) 40 MG tablet Take 1 tablet (40 mg total) by mouth every evening. 05/04/17   Leonie Man, MD  tamsulosin (FLOMAX) 0.4 MG CAPS capsule Take 0.4 mg by mouth daily. 02/20/17   [provider]    Family History Family History  Problem Relation Age of Onset   . Cancer Mother   . Heart attack Father   . Heart attack Brother     Social History Social History   Tobacco Use  . Smoking status: Current Every Day Smoker    Packs/day: 0.50    Years: 57.00    Pack years: 28.50    Types: Cigarettes  . Smokeless tobacco: Never Used  . Tobacco comment: He's been smoking between 0.5 and 2 ppd since age 7.  Substance Use Topics  . Alcohol use: No    Alcohol/week: 0.0 standard drinks    Comment: "quit drinking in 1986"  . Drug use: No     Allergies   Cyclobenzaprine   Review of Systems Review of Systems  All other systems reviewed and are negative.    Physical Exam Updated Vital Signs BP (!) 144/80 (BP Location: Right Arm)   Pulse 87   Temp 98 F (36.7 C) (Oral)   Resp 18   Ht 5\' 6"  (1.676 m)   Wt 76.7 kg   SpO2 97%   BMI 27.28 kg/m   Physical Exam  Constitutional: He appears well-developed and well-nourished.  HENT:  Head: Normocephalic and atraumatic.  Right Ear: External ear normal.  Left Ear: External ear normal.  No oral lesions/invovlement. No angioedema. No facial swelling  Eyes: Conjunctivae are normal. Right eye exhibits no discharge. Left eye exhibits no discharge. No scleral icterus.  No rash in V1 distrubution. No conjunctival or scleral injection.   Neck:  No nuchal rigidity or meningismus  Pulmonary/Chest: Effort normal. No respiratory distress.  Neurological: He is alert.  Skin: Skin is warm and dry. Capillary refill takes less than 2 seconds. No pallor.  Blanchable erythema that follows beardline from where patient shaved. No evidence of fungal infection. No superimposed infection. No blisters, no pustules, no warmth, no draining sinus tracts, no superficial abscesses, no bullous impetigo, no vesicles, no desquamation, no target lesions with dusky purpura or a central bulla. Not tender to touch.  Psychiatric: He has a normal mood and affect.  Nursing note and vitals reviewed.    ED Treatments /  Results  Labs (all labs ordered are listed, but only abnormal results are displayed) Labs Reviewed - No data to display  EKG None  Radiology No results found.  Procedures Procedures (including critical care time)  Medications Ordered in ED Medications - No data to display   Initial Impression / Assessment and  Plan / ED Course  I have reviewed the triage vital signs and the nursing notes.  Pertinent labs & imaging results that were available during my care of the patient were reviewed by me and considered in my medical decision making (see chart for details).     77 y.o. male with blanchable erythema that occurs after shaving on/off for the last 1 month.  Patient is without any unilateral rash, vesicles, eye involvement though making concern for shingles of the face.  No evidence of angioedema.  No facial swelling.  No concerns for anaphylaxis. No signs of fungal infection. No superimposed infection. Pt has a patent airway without stridor and is handling secretions without difficulty. No blisters, no pustules, no warmth, no draining sinus tracts, no superficial abscesses, no bullous impetigo, no vesicles, no desquamation, no target lesions with dusky purpura or a central bulla. Not tender to touch. No concern for superimposed infection. No concern for SJS, TEN, TSS, tick borne illness, syphilis or other life-threatening condition. Will discharge home with low dose topical steroids (given on face), and avoid shaving till resolved. I advised the patient to follow-up with pcp this week (by 12/28/17). Specific return precautions discussed. Time was given for all questions to be answered. The patient verbalized understanding and agreement with plan. The patient appears safe for discharge home.   Final Clinical Impressions(s) / ED Diagnoses   Final diagnoses:  Rash    ED Discharge Orders         Ordered    hydrocortisone cream 1 %     12/24/17 2213           Jillyn Ledger,  PA-C 12/25/17 1019    Quintella Reichert, MD 12/25/17 1357

## 2017-12-24 NOTE — Discharge Instructions (Signed)
Avoid shaving to the affected area.  Apply cream as directed.  Follow up with your primary care provider by the end of the week.  If you develop worsening or new concerning symptoms you can return to the emergency department for re-evaluation.

## 2017-12-24 NOTE — ED Triage Notes (Signed)
Pt presents with redness to face that patient describes as burning x 1 month; pt also states now on bilat hands; pt states been putting jergens lotion on face

## 2017-12-25 ENCOUNTER — Telehealth: Payer: Self-pay | Admitting: *Deleted

## 2017-12-25 NOTE — Telephone Encounter (Signed)
Left message 12/20/17 and 12/25/17 for patient to call and schedule PFT's

## 2017-12-27 ENCOUNTER — Emergency Department (HOSPITAL_COMMUNITY): Payer: Medicare Other

## 2017-12-27 ENCOUNTER — Emergency Department (HOSPITAL_COMMUNITY)
Admission: EM | Admit: 2017-12-27 | Discharge: 2017-12-27 | Disposition: A | Discharge status: Home / Self Care | Payer: Medicare Other | Attending: Emergency Medicine | Admitting: Emergency Medicine

## 2017-12-27 ENCOUNTER — Other Ambulatory Visit: Payer: Self-pay

## 2017-12-27 ENCOUNTER — Encounter (HOSPITAL_COMMUNITY): Payer: Self-pay | Admitting: Emergency Medicine

## 2017-12-27 DIAGNOSIS — Z7984 Long term (current) use of oral hypoglycemic drugs: Secondary | ICD-10-CM | POA: Insufficient documentation

## 2017-12-27 DIAGNOSIS — Z79899 Other long term (current) drug therapy: Secondary | ICD-10-CM | POA: Insufficient documentation

## 2017-12-27 DIAGNOSIS — I480 Paroxysmal atrial fibrillation: Secondary | ICD-10-CM | POA: Diagnosis not present

## 2017-12-27 DIAGNOSIS — Z7982 Long term (current) use of aspirin: Secondary | ICD-10-CM | POA: Diagnosis not present

## 2017-12-27 DIAGNOSIS — I5032 Chronic diastolic (congestive) heart failure: Secondary | ICD-10-CM | POA: Diagnosis not present

## 2017-12-27 DIAGNOSIS — S59901A Unspecified injury of right elbow, initial encounter: Secondary | ICD-10-CM | POA: Diagnosis not present

## 2017-12-27 DIAGNOSIS — F17210 Nicotine dependence, cigarettes, uncomplicated: Secondary | ICD-10-CM | POA: Insufficient documentation

## 2017-12-27 DIAGNOSIS — I2510 Atherosclerotic heart disease of native coronary artery without angina pectoris: Secondary | ICD-10-CM | POA: Diagnosis not present

## 2017-12-27 DIAGNOSIS — J449 Chronic obstructive pulmonary disease, unspecified: Secondary | ICD-10-CM | POA: Diagnosis not present

## 2017-12-27 DIAGNOSIS — E785 Hyperlipidemia, unspecified: Secondary | ICD-10-CM | POA: Diagnosis not present

## 2017-12-27 DIAGNOSIS — M25521 Pain in right elbow: Secondary | ICD-10-CM | POA: Diagnosis not present

## 2017-12-27 DIAGNOSIS — M25511 Pain in right shoulder: Secondary | ICD-10-CM | POA: Diagnosis not present

## 2017-12-27 DIAGNOSIS — Z8673 Personal history of transient ischemic attack (TIA), and cerebral infarction without residual deficits: Secondary | ICD-10-CM | POA: Insufficient documentation

## 2017-12-27 DIAGNOSIS — I110 Hypertensive heart disease with heart failure: Secondary | ICD-10-CM | POA: Insufficient documentation

## 2017-12-27 DIAGNOSIS — E119 Type 2 diabetes mellitus without complications: Secondary | ICD-10-CM | POA: Insufficient documentation

## 2017-12-27 DIAGNOSIS — S43401A Unspecified sprain of right shoulder joint, initial encounter: Secondary | ICD-10-CM | POA: Diagnosis not present

## 2017-12-27 MED ORDER — LIDOCAINE 5 % EX PTCH
1.0000 | MEDICATED_PATCH | CUTANEOUS | Status: DC
  Administered 2017-12-27: 1 via TRANSDERMAL
  Filled 2017-12-27: qty 1

## 2017-12-27 NOTE — ED Provider Notes (Signed)
[facility] EMERGENCY DEPARTMENT Provider Note   CSN: [id number] Arrival date & time: 12/27/17  1332   History   Chief Complaint Chief Complaint  Patient presents with  . Shoulder Pain    HPI [name] is a 77 y.o. male with a past medical history significant for cervical radiculopathy, history of gastric ulcer, hypertension, a. fib and chronic low back pain who presents for evaluation of right shoulder pain.  Per patient he says 3 weeks ago he fell out of bed and landed on his right shoulder.  Has felt chronic pain since this incident.  States pain starts from the lateral aspect of his right shoulder and radiates into his elbow.  Pain is worse with overhead movements.  Has been taking Norco for his pain.  States he was evaluated by his PCP for this and did not receive any x-rays.  Denies fever, chills, chest pain, shortness of breath, numbness, tingling, decreased range of motion his extremities.  HPI  Past Medical History:  Diagnosis Date  . Anemia    takes Ferrous Sulfate daily  . Arthritis    "all over"  . Balance problem 01/2014  . CAP (community acquired pneumonia) 09/18/2014  . Cervical radiculopathy due to degenerative joint disease of spine   . COPD (chronic obstructive pulmonary disease) ([place])   . Coronary artery disease, non-occlusive    a. 03/2010 Nonocclusive disease by cath, performed for ST elevations on ECG;  b. 06/2013 Lexi MV: EF 60%, no ischemia.  . Diabetes mellitus type II    takes Metformin and Lantus daily  . Diastolic CHF, chronic ([place])    a. 12/2012 EF 55-60%, diast dysfxn, triv MR, mildly dil LA/RA.  [name] History of blood transfusion 1982   "when I had stomach OR"  . History of bronchitis    1998  . History of gastric ulcer   . HTN (hypertension)    takes Diltiazem daily  . Hyperlipidemia    takes Pravastatin daily  . Joint pain   . PAF (paroxysmal atrial fibrillation) (H[facility])    Recurrent after atrial flutter (a. 07/2010 Status  post caval tricuspid isthmus ablation by Dr. Midge Aver Metoprolol daily), currently controlled on flecainide plus diltiazem and Coumadin  . Weakness    numbness and tingling both hands    Patient Active Problem List   Diagnosis Date Noted  . Atrial fibrillation with RVR ([place]) 11/08/2017  . Chest pain with moderate risk of acute coronary syndrome 10/04/2017  . HTN (hypertension) 05/21/2017  . Diabetes mellitus type II 05/21/2017  . Diastolic CHF, chronic ([place]) 05/21/2017  . COPD (chronic obstructive pulmonary disease) ([place]) 05/21/2017  . H/O atrial flutter 05/21/2017  . Coronary disease/nonobstructive 05/21/2017  . Acute blood loss anemia 10/02/2016  . Abdominal pain 10/02/2016  . Constipation 10/02/2016  . Left lower quadrant pain   . Coagulopathy ([place])   . Heme positive stool   . Symptomatic anemia 07/10/2016  . Syncope 05/15/2015  . Cervical disc disease 05/15/2015  . Abnormal urinalysis 05/15/2015  . Anemia 05/15/2015  . History of CVA (cerebrovascular accident) 04/03/2014  . Near syncope 02/15/2014  . Pre-syncope 02/15/2014  . Benign neoplasm of rectum and anal canal 12/02/2013  . Upper GI bleeding 11/30/2013  . Tobacco use disorder 11/30/2013  . Anticoagulation goal of INR 2 to 3, for PAF - CHA2DS2Vasc = 7 08/15/2013  . Type 2 diabetes mellitus ([place]) 01/22/2013  . Tobacco abuse counseling 12/15/2012  . Hyperlipidemia   . Obesity (BMI 30-39.9)  12/14/2012  . Persistent atrial fibrillation ([place]) 10/07/2010  . Atrial flutter - status post CTI ablation 07/29/2010  . Essential hypertension 07/29/2010  . Chronic diastolic congestive heart failure ([place]) 07/29/2010  . Coronary artery disease, non-occlusive 07/29/2010    Past Surgical History:  Procedure Laterality Date  . ATRIAL ABLATION SURGERY  08/05/10   CTI ablation for atrial flutter by JA  . CARDIAC CATHETERIZATION  2012   nl LV function, no occlusive CAD, PAF  . CARDIOVERSION  12/07/2010    Successful direct  current cardioversion with atrial fibrillation to normal sinus rhythm  . CARPAL TUNNEL RELEASE Bilateral 01/30/2014   Procedure: BILATERAL CARPAL TUNNEL RELEASE;  Surgeon: Marianna Payment, MD;  Location: [facility];  Service: Orthopedics;  Laterality: Bilateral;  . CATARACT EXTRACTION W/ INTRAOCULAR LENS  IMPLANT, BILATERAL Bilateral   . COLONOSCOPY [name] 12/02/2013   Procedure: COLONOSCOPY;  Surgeon: Irene Shipper, MD;  Location: [facility];  Service: Endoscopy;  Laterality: [name];  . ESOPHAGOGASTRODUODENOSCOPY [name] 09/22/2014   Procedure: ESOPHAGOGASTRODUODENOSCOPY (EGD);  Surgeon: Ronald Lobo, MD;  Location: [facility] ENDOSCOPY;  Service: Endoscopy;  Laterality: [name];  . ESOPHAGOGASTRODUODENOSCOPY [name] 07/11/2016   Procedure: ESOPHAGOGASTRODUODENOSCOPY (EGD);  Surgeon: Doran Stabler, MD;  Location: [facility] ENDOSCOPY;  Service: Endoscopy;  Laterality: [name];  . INCISION AND DRAINAGE ABSCESS / HEMATOMA OF BURSA / KNEE / THIGH Left 1998   knee  . KNEE BURSECTOMY Left 1998  . LAPAROSCOPIC CHOLECYSTECTOMY  03/2010  . LEFT HEART CATH AND CORONARY ANGIOGRAPHY [name] 10/05/2017   Procedure: LEFT HEART CATH AND CORONARY ANGIOGRAPHY;  Surgeon: Martinique, Peter M, MD;  Location: [facility] CV LAB;  Service: Cardiovascular;  Laterality: [name];  . NM MYOVIEW LTD  07/22/2013   Normal EF ~60%, no ischemia or infarction.  [name] PARTIAL GASTRECTOMY  1982   subtotal; "took out 30% for ulcers"  . TRANSTHORACIC ECHOCARDIOGRAM  02/16/2014   EF 60%, no RWMA. - otherwise normal  . YAG LASER APPLICATION Bilateral         Home Medications    Prior to Admission medications   Medication Sig Start Date End Date Taking? Authorizing Provider  acetaminophen (TYLENOL) 325 MG tablet Take 325 mg by mouth every 6 (six) hours as needed for mild pain.    [provider]  albuterol (PROVENTIL HFA;VENTOLIN HFA) 108 (90 Base) MCG/ACT inhaler Inhale 2 puffs into the lungs every 4 (four) hours as needed for wheezing or shortness of breath. 10/30/17    Palumbo, April, MD  aspirin EC 81 MG tablet Take 81 mg by mouth daily.    [provider]  co-enzyme Q-10 30 MG capsule Take 30 mg by mouth daily.     [provider]  diclofenac sodium (VOLTAREN) 1 % GEL Apply 4 g topically 3 (three) times daily as needed (for back pain). 11/10/17   Rory Percy, DO  diltiazem (CARDIZEM CD) 180 MG 24 hr capsule Take 1 capsule (180 mg total) by mouth daily. 12/11/17   Barrett, Evelene Croon, PA-C  hydrocortisone cream 1 % Apply to affected area 2 times daily 12/24/17   Maczis, Barth Kirks, PA-C  Melatonin 3 MG TABS Take 1 tablet (3 mg total) by mouth at bedtime. 11/16/17   Anderson, Chelsey L, DO  metFORMIN (GLUCOPHAGE) 1000 MG tablet Take 1,000 mg by mouth 2 (two) times daily with a meal.     [provider]  Multiple Vitamin (MULTIVITAMIN WITH MINERALS) TABS tablet Take 1 tablet by mouth 2 (two) times daily.  [provider]  nitroGLYCERIN (NITROSTAT) 0.4 MG SL tablet Place 1 tablet (0.4 mg total) under the tongue every 5 (five) minutes x 3 doses as needed for chest pain. 11/19/15   Erlene Quan, PA-C  pantoprazole (PROTONIX) 40 MG tablet Take 1 tablet (40 mg total) by mouth 2 (two) times daily before a meal. 11/30/16   Domenic Polite, MD  polyethylene glycol [facility] / Floria Raveling) packet Take 17 g by mouth as needed for moderate constipation. 11/10/17   Rory Percy, DO  pravastatin (PRAVACHOL) 40 MG tablet Take 1 tablet (40 mg total) by mouth every evening. 05/04/17   Leonie Man, MD  tamsulosin (FLOMAX) 0.4 MG CAPS capsule Take 0.4 mg by mouth daily. 02/20/17   [provider]    Family History Family History  Problem Relation Age of Onset  . Cancer Mother   . Heart attack Father   . Heart attack Brother     Social History Social History   Tobacco Use  . Smoking status: Current Every Day Smoker    Packs/day: 0.50    Years: 57.00    Pack years: 28.50    Types: Cigarettes  . Smokeless tobacco: Never  Used  . Tobacco comment: He's been smoking between 0.5 and 2 ppd since age 80.  Substance Use Topics  . Alcohol use: No    Alcohol/week: 0.0 standard drinks    Comment: "quit drinking in 1986"  . Drug use: No     Allergies   Cyclobenzaprine   Review of Systems Review of Systems  Constitutional: Negative for activity change, appetite change, chills, diaphoresis, fatigue and fever.  Respiratory: Negative for chest tightness and shortness of breath.   Cardiovascular: Negative for chest pain, palpitations and leg swelling.  Musculoskeletal: Negative for neck pain and neck stiffness.       Right shoulder pain and right elbow pain.  Skin: Negative.      Physical Exam Updated Vital Signs BP (!) 125/56   Pulse 96   Temp 99 F (37.2 C) (Oral)   Resp 18   Ht 5\' 6"  (1.676 m)   Wt 76 kg   SpO2 98%   BMI 27.04 kg/m   Physical Exam  Constitutional: He appears well-developed and well-nourished. No distress.  HENT:  Head: Atraumatic.  Eyes: Pupils are equal, round, and reactive to light.  Neck: Normal range of motion. Neck supple.  Cardiovascular: Normal rate and regular rhythm.  Pulmonary/Chest: Effort normal. No respiratory distress.  Abdominal: Soft. He exhibits no distension.  Musculoskeletal: Normal range of motion.  No gross deformity of right shoulder right elbow. Mild tenderness to palpation of right shoulder.  No tenderness to palpation right elbow.  Able to flex and extend at the right elbow.  Full ROM abduction and abduction of bilateral upper extremities.  No neck tenderness or neck stiffness.  No midline tenderness to neck. Full range of motion right upper extremity. 5/5 grip strength.  Negative Hawkins, and empty can test.  Neurological: He is alert. He has normal strength.  Tach sensation to sharp and dull lateral upper extremities.  Skin: Skin is warm and dry. He is not diaphoretic.  No erythema, ecchymosis, edema or warmth to bilateral upper extremities.     Psychiatric: He has a normal mood and affect.  Nursing note and vitals reviewed.    ED Treatments / Results  Labs (all labs ordered are listed, but only abnormal results are displayed) Labs Reviewed - No data to display  EKG  None  Radiology Dg Shoulder Right  Result Date: 12/27/2017 CLINICAL DATA:  Rolled off the bed 1 month ago, still having shoulder pain and pain down entire RIGHT humerus EXAM: RIGHT SHOULDER - 2+ VIEW COMPARISON:  None FINDINGS: Osseous demineralization. AC joint alignment normal. Visualized RIGHT ribs intact. No acute fracture, dislocation, or bone destruction. IMPRESSION: No acute abnormalities. Electronically Signed   By: Lavonia Dana M.D.   On: 12/27/2017 15:28   Dg Elbow Complete Right  Result Date: 12/27/2017 CLINICAL DATA:  Right shoulder 2 right elbow pain post fall. EXAM: RIGHT ELBOW - COMPLETE 3+ VIEW COMPARISON:  None. FINDINGS: There is no evidence of fracture, dislocation, or joint effusion. There is no evidence of arthropathy or other focal bone abnormality. Soft tissues are unremarkable. IMPRESSION: Negative. Electronically Signed   By: Fidela Salisbury M.D.   On: 12/27/2017 15:28    Procedures Procedures (including critical care time)  Medications Ordered in ED Medications  lidocaine (LIDODERM) 5 % 1 patch (has no administration in time range)     Initial Impression / Assessment and Plan / ED Course  I have reviewed the triage vital signs and the nursing notes.  Pertinent labs & imaging results that were available during my care of the patient were reviewed by me and considered in my medical decision making (see chart for details).  77 year old otherwise well-appearing male presents for evaluation of right shoulder right elbow pain x3 weeks.  He sustained an injury when he rolled out of bed and fell onto his right shoulder.  Has had continued pain since injury.  He does have a history of cervical radiculopathy, however he does not have any  numbness or tingling in his extremities.  No neck pain or neck stiffness.  Full range of motion right upper extremity, however does have increased pain with overhead motion to right arm.  No obvious gross deformities of right upper extremity.  Neurovascularly intact.  5/5 strength to bilateral upper extremities.  Plain film without any evidence of acute fracture or dislocation.  Negative Hawkins, empty can test.  He is currently taking Norco for his pain, prescribed by his primary care provider for this issue.  Discussed with patient he will need outpatient orthopedics referral for his continued pain given his negative x-ray.  I have placed a Lidoderm patch to assist in his pain control.  Discussed reasons to return to the emergency department.  Patient voiced understanding is agreeable for follow-up    Final Clinical Impressions(s) / ED Diagnoses   Final diagnoses:  Acute pain of right shoulder    ED Discharge Orders    None       Sophiana Milanese A, PA-C 12/27/17 1751    Little, Wenda Overland, MD 12/28/17 [id number]

## 2017-12-27 NOTE — Discharge Instructions (Addendum)
You were evaluated today for right shoulder and elbow pain.  Your x-ray of your right shoulder and elbow were negative for fracture dislocation.  I would like to have you follow-up with orthopedics for reevaluation.  I have placed a lidocaine patch on your right shoulder.  Please make sure to remove this after 12 hours.  If this helps with your pain please help with your primary care provider and he can write a prescription for these  Please return to the emergency room with any new or worsening symptoms such as:  Get help right away if: Your arm, hand, or fingers: Tingle. Are numb. Are swollen. Are painful. Turn white or blue.

## 2017-12-27 NOTE — ED Notes (Signed)
Declined W/C at D/C and was escorted to lobby by RN. 

## 2017-12-27 NOTE — ED Notes (Signed)
Provider in triage seen patient appropriate for fast track.

## 2017-12-27 NOTE — ED Provider Notes (Signed)
Patient placed in Quick Look pathway, seen and evaluated   Chief Complaint: shoulder pain right  HPI:  [name] is a 77 y.o. male who presents to the ED with right shoulder pain. Onset 3 weeks ago fell out of bed onto right shoulder. Pain has continued currently 8/10 achy sore radiating to right elbow. Able to bend elbow without incident limited range of motion when rasing arm over head. patient reports he has been evaluated by his PCP for this but continues to hurt.  ROS: M/S: right shoulder pain  Physical Exam:  BP (!) 125/56   Pulse 96   Temp 99 F (37.2 C) (Oral)   Resp 18   Ht 5\' 6"  (1.676 m)   Wt 76 kg   SpO2 98%   BMI 27.04 kg/m    Gen: No distress  Neuro: Awake and Alert  Skin: Warm and dry  M/S: tender with palpation anterior aspect of right shoulder and upper arm.   Initiation of care has begun. The patient has been counseled on the process, plan, and necessity for staying for the completion/evaluation, and the remainder of the medical screening examination    Ashley Murrain, NP 12/27/17 1419    Malvin Johns, MD 12/27/17 1534

## 2017-12-27 NOTE — ED Triage Notes (Signed)
Onset 3 weeks ago fell out of bed onto right shoulder. Pain has continued currently 8/10 achy sore radiating to right elbow. Able to bend elbow without incident limited range of motion when rasing arm over head.

## 2018-01-02 ENCOUNTER — Encounter (HOSPITAL_COMMUNITY): Payer: Self-pay | Admitting: Emergency Medicine

## 2018-01-02 ENCOUNTER — Emergency Department (HOSPITAL_COMMUNITY)
Admission: EM | Admit: 2018-01-02 | Discharge: 2018-01-02 | Disposition: A | Discharge status: Home / Self Care | Payer: Medicare Other | Attending: Emergency Medicine | Admitting: Emergency Medicine

## 2018-01-02 ENCOUNTER — Inpatient Hospital Stay: Payer: Medicare Other

## 2018-01-02 ENCOUNTER — Inpatient Hospital Stay: Payer: Medicare Other | Attending: Hematology & Oncology | Admitting: Family

## 2018-01-02 ENCOUNTER — Other Ambulatory Visit: Payer: Self-pay

## 2018-01-02 ENCOUNTER — Encounter: Payer: Self-pay | Admitting: Family

## 2018-01-02 VITALS — BP 114/80 | HR 86 | Temp 97.6°F | Resp 20 | Wt 173.4 lb

## 2018-01-02 DIAGNOSIS — Z7984 Long term (current) use of oral hypoglycemic drugs: Secondary | ICD-10-CM | POA: Diagnosis not present

## 2018-01-02 DIAGNOSIS — F17210 Nicotine dependence, cigarettes, uncomplicated: Secondary | ICD-10-CM | POA: Insufficient documentation

## 2018-01-02 DIAGNOSIS — Z794 Long term (current) use of insulin: Secondary | ICD-10-CM | POA: Diagnosis not present

## 2018-01-02 DIAGNOSIS — I5032 Chronic diastolic (congestive) heart failure: Secondary | ICD-10-CM | POA: Insufficient documentation

## 2018-01-02 DIAGNOSIS — R21 Rash and other nonspecific skin eruption: Secondary | ICD-10-CM

## 2018-01-02 DIAGNOSIS — D500 Iron deficiency anemia secondary to blood loss (chronic): Secondary | ICD-10-CM

## 2018-01-02 DIAGNOSIS — N481 Balanitis: Secondary | ICD-10-CM | POA: Diagnosis not present

## 2018-01-02 DIAGNOSIS — D508 Other iron deficiency anemias: Secondary | ICD-10-CM

## 2018-01-02 DIAGNOSIS — I110 Hypertensive heart disease with heart failure: Secondary | ICD-10-CM | POA: Insufficient documentation

## 2018-01-02 DIAGNOSIS — R52 Pain, unspecified: Secondary | ICD-10-CM | POA: Diagnosis not present

## 2018-01-02 DIAGNOSIS — K909 Intestinal malabsorption, unspecified: Secondary | ICD-10-CM | POA: Insufficient documentation

## 2018-01-02 DIAGNOSIS — I2510 Atherosclerotic heart disease of native coronary artery without angina pectoris: Secondary | ICD-10-CM | POA: Diagnosis not present

## 2018-01-02 DIAGNOSIS — L0100 Impetigo, unspecified: Secondary | ICD-10-CM | POA: Diagnosis not present

## 2018-01-02 DIAGNOSIS — Z79899 Other long term (current) drug therapy: Secondary | ICD-10-CM | POA: Insufficient documentation

## 2018-01-02 DIAGNOSIS — N3000 Acute cystitis without hematuria: Secondary | ICD-10-CM

## 2018-01-02 DIAGNOSIS — R5383 Other fatigue: Secondary | ICD-10-CM | POA: Diagnosis not present

## 2018-01-02 DIAGNOSIS — Z7982 Long term (current) use of aspirin: Secondary | ICD-10-CM | POA: Diagnosis not present

## 2018-01-02 DIAGNOSIS — G629 Polyneuropathy, unspecified: Secondary | ICD-10-CM | POA: Diagnosis not present

## 2018-01-02 DIAGNOSIS — K922 Gastrointestinal hemorrhage, unspecified: Secondary | ICD-10-CM | POA: Diagnosis not present

## 2018-01-02 DIAGNOSIS — E119 Type 2 diabetes mellitus without complications: Secondary | ICD-10-CM | POA: Diagnosis not present

## 2018-01-02 DIAGNOSIS — J449 Chronic obstructive pulmonary disease, unspecified: Secondary | ICD-10-CM | POA: Insufficient documentation

## 2018-01-02 LAB — CMP (CANCER CENTER ONLY)
ALK PHOS: 90 U/L (ref 38–126)
ALT: 47 U/L — AB (ref 0–44)
AST: 32 U/L (ref 15–41)
Albumin: 3.5 g/dL (ref 3.5–5.0)
Anion gap: 10 (ref 5–15)
BUN: 14 mg/dL (ref 8–23)
CALCIUM: 10 mg/dL (ref 8.9–10.3)
CHLORIDE: 103 mmol/L (ref 98–111)
CO2: 26 mmol/L (ref 22–32)
CREATININE: 0.98 mg/dL (ref 0.61–1.24)
Glucose, Bld: 151 mg/dL — ABNORMAL HIGH (ref 70–99)
Potassium: 4.4 mmol/L (ref 3.5–5.1)
Sodium: 139 mmol/L (ref 135–145)
Total Bilirubin: <0.2 mg/dL — ABNORMAL LOW (ref 0.3–1.2)
Total Protein: 6.7 g/dL (ref 6.5–8.1)

## 2018-01-02 LAB — CBC WITH DIFFERENTIAL (CANCER CENTER ONLY)
ABS IMMATURE GRANULOCYTES: 0.02 10*3/uL (ref 0.00–0.07)
Basophils Absolute: 0.1 10*3/uL (ref 0.0–0.1)
Basophils Relative: 1 %
Eosinophils Absolute: 0.3 10*3/uL (ref 0.0–0.5)
Eosinophils Relative: 4 %
HCT: 37.9 % — ABNORMAL LOW (ref 39.0–52.0)
HEMOGLOBIN: 11.8 g/dL — AB (ref 13.0–17.0)
IMMATURE GRANULOCYTES: 0 %
LYMPHS PCT: 8 %
Lymphs Abs: 0.6 10*3/uL — ABNORMAL LOW (ref 0.7–4.0)
MCH: 28.4 pg (ref 26.0–34.0)
MCHC: 31.1 g/dL (ref 30.0–36.0)
MCV: 91.3 fL (ref 80.0–100.0)
MONO ABS: 0.8 10*3/uL (ref 0.1–1.0)
MONOS PCT: 12 %
NEUTROS ABS: 4.9 10*3/uL (ref 1.7–7.7)
NEUTROS PCT: 75 %
NRBC: 0 % (ref 0.0–0.2)
PLATELETS: 283 10*3/uL (ref 150–400)
RBC: 4.15 MIL/uL — ABNORMAL LOW (ref 4.22–5.81)
RDW: 17.7 % — ABNORMAL HIGH (ref 11.5–15.5)
WBC Count: 6.6 10*3/uL (ref 4.0–10.5)

## 2018-01-02 LAB — IRON AND TIBC
Iron: 45 ug/dL (ref 42–163)
Saturation Ratios: 13 % — ABNORMAL LOW (ref 42–163)
TIBC: 343 ug/dL (ref 202–409)
UIBC: 298 ug/dL

## 2018-01-02 LAB — RETICULOCYTES
IMMATURE RETIC FRACT: 15.3 % (ref 2.3–15.9)
RBC.: 4.15 MIL/uL — ABNORMAL LOW (ref 4.22–5.81)
RETIC COUNT ABSOLUTE: 87.2 10*3/uL (ref 19.0–186.0)
RETIC CT PCT: 2.1 % (ref 0.4–3.1)

## 2018-01-02 LAB — FERRITIN: FERRITIN: 50 ng/mL (ref 24–336)

## 2018-01-02 IMAGING — CR DG CHEST 2V
2 series · 2 of 2 positions shown · non-contrast
Comparison: 09/28/2016

CLINICAL DATA: Shortness of breath and weakness. Cough for a day.
History of bronchitis. Current smoker

EXAM:
CHEST  2 VIEW

[w chest pa]
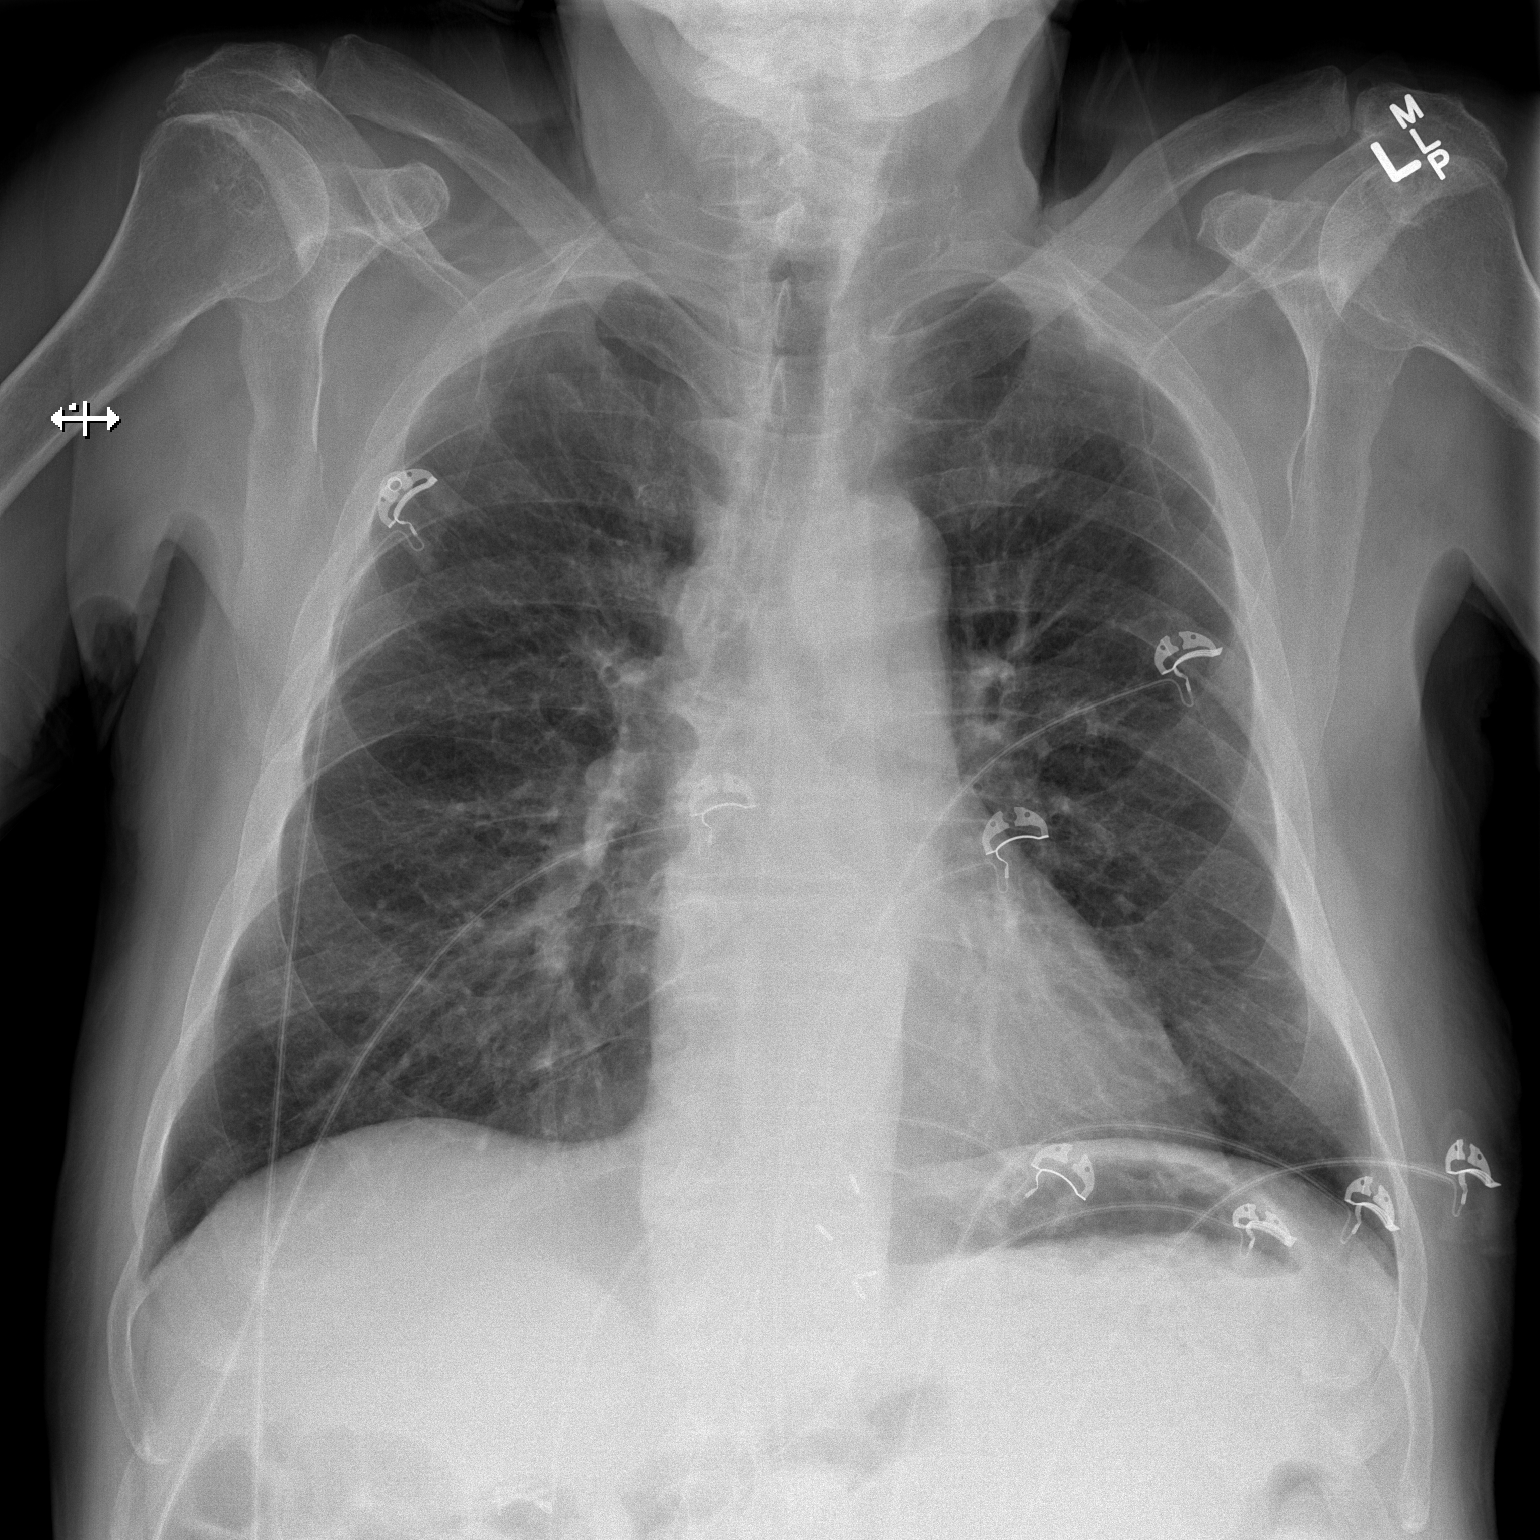

[w chest lat]
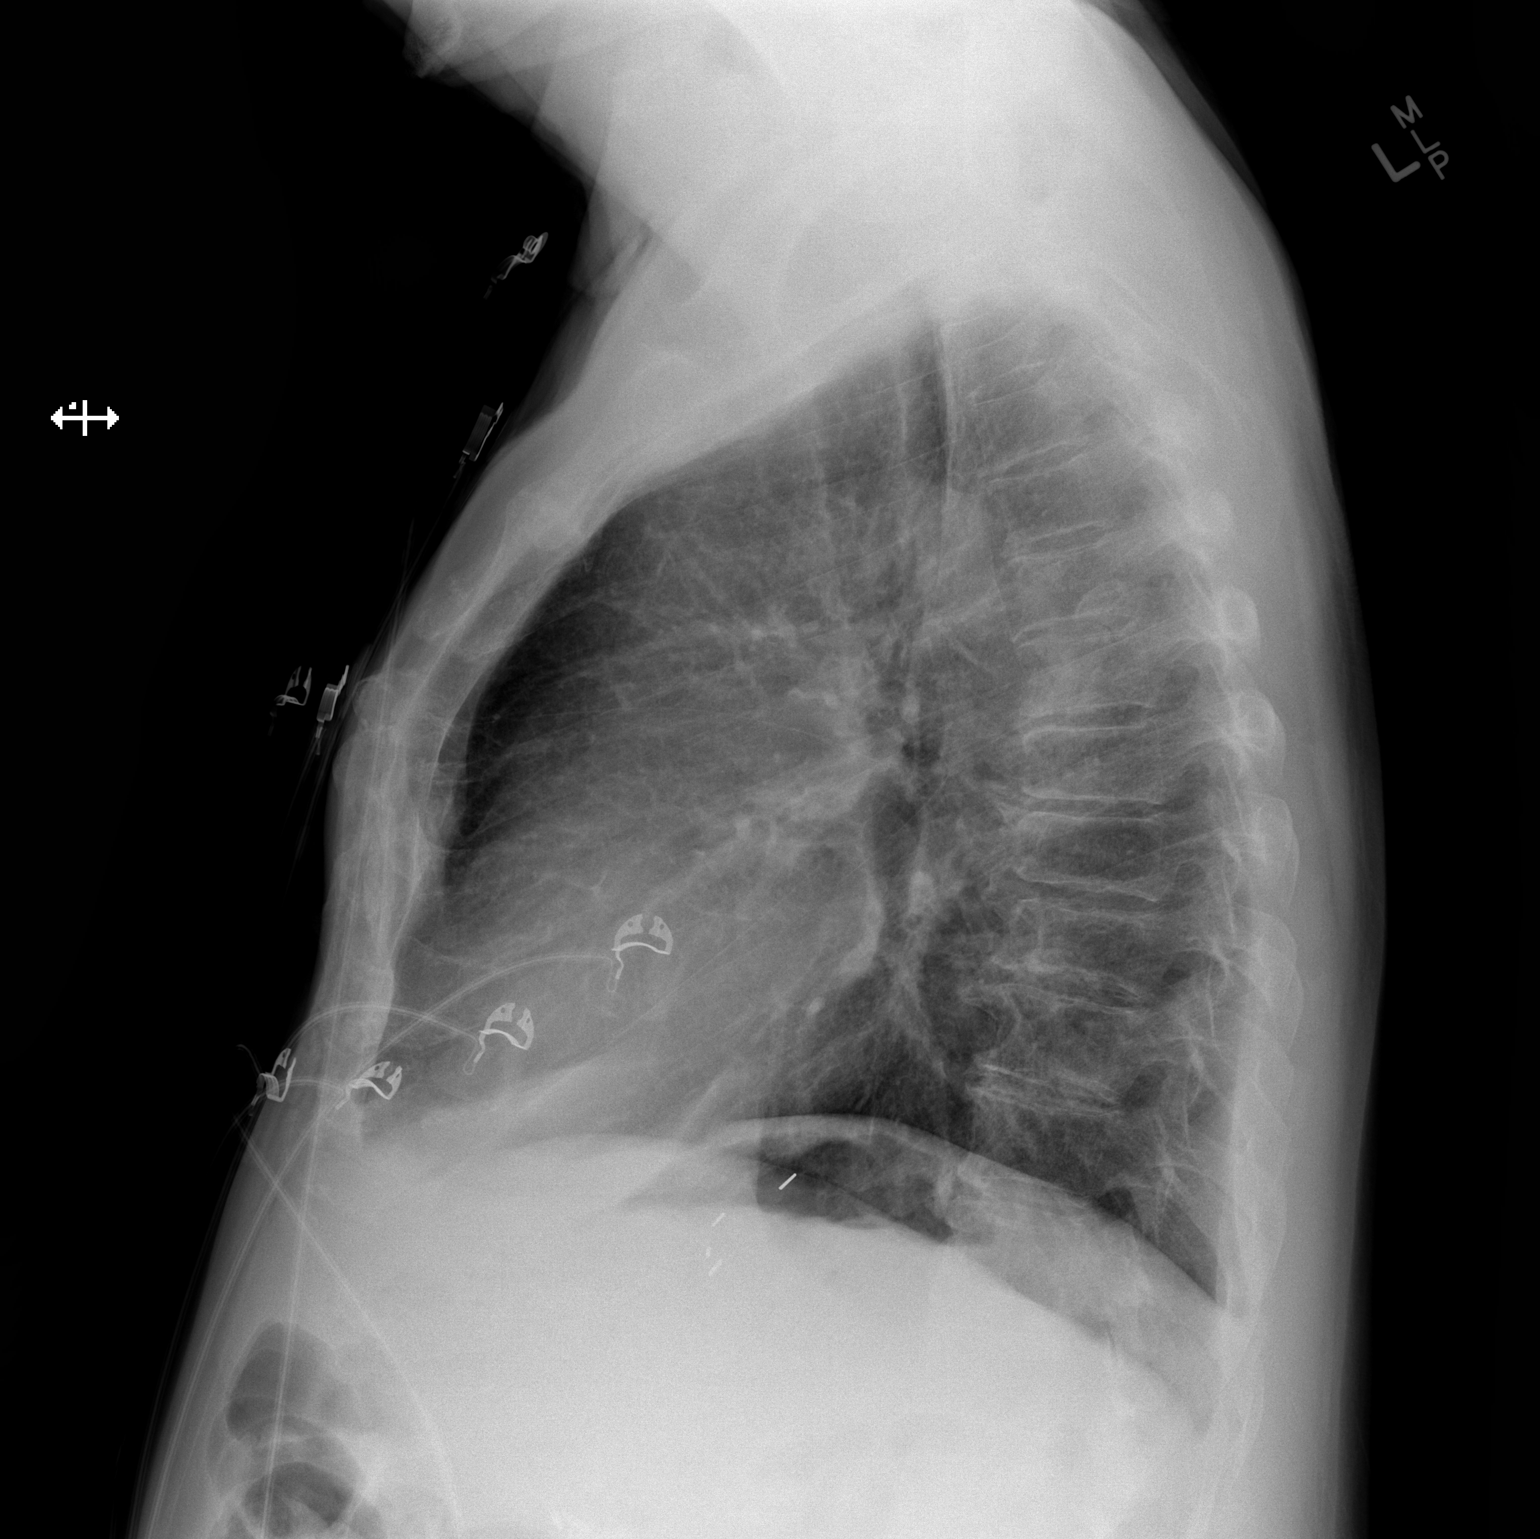

[2 of 2 positions shown; findings below may reference images not displayed]

FINDINGS: Peribronchial thickening with perihilar linear opacities consistent
with chronic bronchitis. Normal heart size and pulmonary
vascularity. No focal airspace disease or consolidation in the
lungs. No blunting of costophrenic angles. No pneumothorax.
Mediastinal contours appear intact. Surgical clips in the right
upper abdomen and EG junction. Degenerative changes in the spine.
IMPRESSION: Chronic bronchitic changes in the lungs. No evidence of active
consolidation.

## 2018-01-02 MED ORDER — NYSTATIN 100000 UNIT/GM EX CREA: TOPICAL_CREAM | CUTANEOUS | 0 refills | Status: DC

## 2018-01-02 NOTE — Discharge Instructions (Signed)
Follow up with your PCP. If this persists they may want to send you to a dermatologist.

## 2018-01-02 NOTE — ED Triage Notes (Signed)
Pt reports rash for about 6 weeks on face, neck, and penis. Pt reports when he stops taking his metformin, the rash will go away. States he is not able to get appt with his PCP.

## 2018-01-02 NOTE — Progress Notes (Signed)
Hematology and Oncology Follow Up Visit  [name] [id number] [birth year] 78 y.o. 01/02/2018   Principle Diagnosis:  Iron deficiency anemia secondary to intermittent GI bleed  Current Therapy:   IV iron as indicated Supportive transfusions as needed   Interim History: [name] is here today with his daughter for follow-up. He is doing well but still has some occasional fatigue. He responded nicely to the IV iron he received in September and Hgb is now 11.8.  He has a rash on his cheeks and chin that appears to be fungal. He states that it itches and burns. He is following up with his PCP for this.  No episodes of bleeding. No petechial rash.  No fever, chills, n/v, cough, dizziness, SOB, chest pain, palpitations, abdominal pain or changes in bowel or bladder habits.  No swelling or tenderness in his extremities. No c/o pain.  The neuropathy in his toes is unchanged. He uses a cane when ambulating for support.  No falls or syncopal episodes to report.  He has a good appetite and is staying well hydrated. Her weight is stable.   ECOG Performance Status: 1 - Symptomatic but completely ambulatory  Medications:  Allergies as of 01/02/2018      Reactions   Cyclobenzaprine Other (See Comments)   Unsteady gait      Medication List        Accurate as of 01/02/18  9:26 AM. Always use your most recent med list.          acetaminophen 325 MG tablet Commonly known as:  TYLENOL Take 325 mg by mouth every 6 (six) hours as needed for mild pain.   albuterol 108 (90 Base) MCG/ACT inhaler Commonly known as:  PROVENTIL HFA;VENTOLIN HFA Inhale 2 puffs into the lungs every 4 (four) hours as needed for wheezing or shortness of breath.   aspirin EC 81 MG tablet Take 81 mg by mouth daily.   co-enzyme Q-10 30 MG capsule Take 30 mg by mouth daily.   diclofenac sodium 1 % Gel Commonly known as:  VOLTAREN Apply 4 g topically 3 (three) times daily as needed (for back pain).     diltiazem 180 MG 24 hr capsule Commonly known as:  CARDIZEM CD Take 1 capsule (180 mg total) by mouth daily.   HYDROcodone-acetaminophen 5-325 MG tablet Commonly known as:  NORCO/VICODIN Take by mouth every 8 (eight) hours as needed.   hydrocortisone cream 1 % Apply to affected area 2 times daily   Melatonin 3 MG Tabs Take 1 tablet (3 mg total) by mouth at bedtime.   metFORMIN 1000 MG tablet Commonly known as:  GLUCOPHAGE Take 1,000 mg by mouth 2 (two) times daily with a meal.   multivitamin with minerals Tabs tablet Take 1 tablet by mouth 2 (two) times daily.   nitroGLYCERIN 0.4 MG SL tablet Commonly known as:  NITROSTAT Place 1 tablet (0.4 mg total) under the tongue every 5 (five) minutes x 3 doses as needed for chest pain.   pantoprazole 40 MG tablet Commonly known as:  PROTONIX Take 1 tablet (40 mg total) by mouth 2 (two) times daily before a meal.   polyethylene glycol packet Commonly known as:  MIRALAX / GLYCOLAX Take 17 g by mouth as needed for moderate constipation.   pravastatin 40 MG tablet Commonly known as:  PRAVACHOL Take 1 tablet (40 mg total) by mouth every evening.   PRECISION QID TEST test strip Generic drug:  glucose blood Check glucose twice daily  tamsulosin 0.4 MG Caps capsule Commonly known as:  FLOMAX Take 0.4 mg by mouth daily.       Allergies:  Allergies  Allergen Reactions  . Cyclobenzaprine Other (See Comments)    Unsteady gait    Past Medical History, Surgical history, Social history, and Family History were reviewed and updated.  Review of Systems: All other 10 point review of systems is negative.   Physical Exam:  weight is 173 lb 6.4 oz (78.7 kg). His oral temperature is 97.6 F (36.4 C). His blood pressure is 114/80 and his pulse is 86. His respiration is 20 and oxygen saturation is 99%.   Wt Readings from Last 3 Encounters:  01/02/18 173 lb 6.4 oz (78.7 kg)  12/27/17 167 lb 8.8 oz (76 kg)  12/24/17 169 lb (76.7 kg)     Ocular: Sclerae unicteric, pupils equal, round and reactive to light Ear-nose-throat: Oropharynx clear, dentition fair Lymphatic: No cervical, supraclavicular or axillary adenopathy Lungs no rales or rhonchi, good excursion bilaterally Heart regular rate and rhythm, no murmur appreciated Abd soft, nontender, positive bowel sounds, no liver or spleen tip palpated on exam, no fluid wave  MSK no focal spinal tenderness, no joint edema Neuro: non-focal, well-oriented, appropriate affect Breasts: Deferred   Lab Results  Component Value Date   WBC 6.6 01/02/2018   HGB 11.8 (L) 01/02/2018   HCT 37.9 (L) 01/02/2018   MCV 91.3 01/02/2018   PLT 283 01/02/2018   Lab Results  Component Value Date   FERRITIN 8 (L) 11/21/2017   IRON 16 (L) 11/21/2017   TIBC 366 11/21/2017   UIBC 351 11/21/2017   IRONPCTSAT 4 (L) 11/21/2017   Lab Results  Component Value Date   RETICCTPCT 2.1 01/02/2018   RBC 4.15 (L) 01/02/2018   RBC 4.15 (L) 01/02/2018   No results found for: KPAFRELGTCHN, LAMBDASER, KAPLAMBRATIO No results found for: IGGSERUM, IGA, IGMSERUM No results found for: Odetta Pink, SPEI   Chemistry      Component Value Date/Time   NA 140 11/27/2017 2223   NA 139 10/20/2016 1110   K 4.0 11/27/2017 2223   K 4.0 10/20/2016 1110   CL 108 11/27/2017 2223   CO2 22 11/27/2017 2223   CO2 26 10/20/2016 1110   BUN 11 11/27/2017 2223   BUN 15.2 10/20/2016 1110   CREATININE 0.99 11/27/2017 2223   CREATININE 1.03 11/21/2017 0816   CREATININE 0.8 10/20/2016 1110      Component Value Date/Time   CALCIUM 8.9 11/27/2017 2223   CALCIUM 9.6 10/20/2016 1110   ALKPHOS 73 12/11/2017 0902   ALKPHOS 85 10/20/2016 1110   AST 31 12/11/2017 0902   AST 21 11/21/2017 0816   AST 33 10/20/2016 1110   ALT 45 (H) 12/11/2017 0902   ALT 31 11/21/2017 0816   ALT 42 10/20/2016 1110   BILITOT <0.2 12/11/2017 0902   BILITOT <0.2 (L) 11/21/2017 0816    BILITOT <0.22 10/20/2016 1110       Impression and Plan: [name] is a very pleasant 77 yo caucasian gentleman with iron deficiency anemia secondary to intermittent GI blood loss and malabsorption on Protionix.  We will see what his iron studies show and bring him back in for infusion if needed.  We will plan to see him back in another 6 weeks for follow-up.  They will contact our office with any questions or concerns. We can certainly see him sooner if need be.   Laverna Peace,  NP 10/9/20199:26 AM

## 2018-01-02 NOTE — ED Notes (Signed)
Patient verbalizes understanding of discharge instructions. Opportunity for questioning and answers were provided. Armband removed by staff, pt discharged from ED.  

## 2018-01-02 NOTE — ED Provider Notes (Signed)
[facility] EMERGENCY DEPARTMENT Provider Note   CSN: [id number] Arrival date & time: 01/02/18  1654     History   Chief Complaint Chief Complaint  Patient presents with  . Rash    HPI [name] is a 77 y.o. male.  77 yo M with a cc of a rash. Started about a month ago.  Usually to the lower part of his face, down his neck and to the head of his penis.  Burning, constat rash.  Denies spread of the rash. Has not tried any meds for it.  Denies prior STD, denies new meds.  Denies fevers/chills.  Had some nausea earlier today but deny prior.    The history is provided by the patient.  Rash   This is a new problem. The current episode started more than 1 week ago. The problem has not changed since onset.The problem is associated with nothing. There has been no fever. The fever has been present for less than 1 day. The pain is at a severity of 1/10. The patient is experiencing no pain. Associated symptoms include itching. Associated symptoms comments: Burning . He has tried nothing for the symptoms. The treatment provided no relief.    Past Medical History:  Diagnosis Date  . Anemia    takes Ferrous Sulfate daily  . Arthritis    "all over"  . Balance problem 01/2014  . CAP (community acquired pneumonia) 09/18/2014  . Cervical radiculopathy due to degenerative joint disease of spine   . COPD (chronic obstructive pulmonary disease) ([place])   . Coronary artery disease, non-occlusive    a. 03/2010 Nonocclusive disease by cath, performed for ST elevations on ECG;  b. 06/2013 Lexi MV: EF 60%, no ischemia.  . Diabetes mellitus type II    takes Metformin and Lantus daily  . Diastolic CHF, chronic ([place])    a. 12/2012 EF 55-60%, diast dysfxn, triv MR, mildly dil LA/RA.  [name] History of blood transfusion 1982   "when I had stomach OR"  . History of bronchitis    1998  . History of gastric ulcer   . HTN (hypertension)    takes Diltiazem daily  . Hyperlipidemia    takes Pravastatin daily  . Joint pain   . PAF (paroxysmal atrial fibrillation) (HCC)    Recurrent after atrial flutter (a. 07/2010 Status post caval tricuspid isthmus ablation by Dr. Midge Aver Metoprolol daily), currently controlled on flecainide plus diltiazem and Coumadin  . Weakness    numbness and tingling both hands    Patient Active Problem List   Diagnosis Date Noted  . Atrial fibrillation with RVR ([place]) 11/08/2017  . Chest pain with moderate risk of acute coronary syndrome 10/04/2017  . HTN (hypertension) 05/21/2017  . Diabetes mellitus type II 05/21/2017  . Diastolic CHF, chronic ([place]) 05/21/2017  . COPD (chronic obstructive pulmonary disease) ([place]) 05/21/2017  . H/O atrial flutter 05/21/2017  . Coronary disease/nonobstructive 05/21/2017  . Acute blood loss anemia 10/02/2016  . Abdominal pain 10/02/2016  . Constipation 10/02/2016  . Left lower quadrant pain   . Coagulopathy ([place])   . Heme positive stool   . Symptomatic anemia 07/10/2016  . Syncope 05/15/2015  . Cervical disc disease 05/15/2015  . Abnormal urinalysis 05/15/2015  . Anemia 05/15/2015  . History of CVA (cerebrovascular accident) 04/03/2014  . Near syncope 02/15/2014  . Pre-syncope 02/15/2014  . Benign neoplasm of rectum and anal canal 12/02/2013  . Upper GI bleeding 11/30/2013  . Tobacco use disorder 11/30/2013  .  Anticoagulation goal of INR 2 to 3, for PAF - CHA2DS2Vasc = 7 08/15/2013  . Type 2 diabetes mellitus ([place]) 01/22/2013  . Tobacco abuse counseling 12/15/2012  . Hyperlipidemia   . Obesity (BMI 30-39.9) 12/14/2012  . Persistent atrial fibrillation ([place]) 10/07/2010  . Atrial flutter - status post CTI ablation 07/29/2010  . Essential hypertension 07/29/2010  . Chronic diastolic congestive heart failure ([place]) 07/29/2010  . Coronary artery disease, non-occlusive 07/29/2010    Past Surgical History:  Procedure Laterality Date  . ATRIAL ABLATION SURGERY  08/05/10   CTI ablation for atrial  flutter by JA  . CARDIAC CATHETERIZATION  2012   nl LV function, no occlusive CAD, PAF  . CARDIOVERSION  12/07/2010    Successful direct current cardioversion with atrial fibrillation to normal sinus rhythm  . CARPAL TUNNEL RELEASE Bilateral 01/30/2014   Procedure: BILATERAL CARPAL TUNNEL RELEASE;  Surgeon: Marianna Payment, MD;  Location: [facility];  Service: Orthopedics;  Laterality: Bilateral;  . CATARACT EXTRACTION W/ INTRAOCULAR LENS  IMPLANT, BILATERAL Bilateral   . COLONOSCOPY [name] 12/02/2013   Procedure: COLONOSCOPY;  Surgeon: Irene Shipper, MD;  Location: [facility];  Service: Endoscopy;  Laterality: [name];  . ESOPHAGOGASTRODUODENOSCOPY [name] 09/22/2014   Procedure: ESOPHAGOGASTRODUODENOSCOPY (EGD);  Surgeon: Ronald Lobo, MD;  Location: [facility] ENDOSCOPY;  Service: Endoscopy;  Laterality: [name];  . ESOPHAGOGASTRODUODENOSCOPY [name] 07/11/2016   Procedure: ESOPHAGOGASTRODUODENOSCOPY (EGD);  Surgeon: Doran Stabler, MD;  Location[facility] ENDOSCOPY;  Service: Endoscopy;  Laterality: [name];  . INCISION AND DRAINAGE ABSCESS / HEMATOMA OF BURSA / KNEE / THIGH Left 1998   knee  . KNEE BURSECTOMY Left 1998  . LAPAROSCOPIC CHOLECYSTECTOMY  03/2010  . LEFT HEART CATH AND CORONARY ANGIOGRAPHY [name] 10/05/2017   Procedure: LEFT HEART CATH AND CORONARY ANGIOGRAPHY;  Surgeon: Martinique, Peter M, MD;  Location: [facility];  Service: Cardiovascular;  Laterality: [name];  . NM MYOVIEW LTD  07/22/2013   Normal EF ~60%, no ischemia or infarction.  [name] PARTIAL GASTRECTOMY  1982   subtotal; "took out 30% for ulcers"  . TRANSTHORACIC ECHOCARDIOGRAM  02/16/2014   EF 60%, no RWMA. - otherwise normal  . YAG LASER APPLICATION Bilateral         Home Medications    Prior to Admission medications   Medication Sig Start Date End Date Taking? Authorizing Provider  acetaminophen (TYLENOL) 325 MG tablet Take 325 mg by mouth every 6 (six) hours as needed for mild pain.    [provider]  albuterol (PROVENTIL  HFA;VENTOLIN HFA) 108 (90 Base) MCG/ACT inhaler Inhale 2 puffs into the lungs every 4 (four) hours as needed for wheezing or shortness of breath. 10/30/17   Palumbo, April, MD  aspirin EC 81 MG tablet Take 81 mg by mouth daily.    [provider]  co-enzyme Q-10 30 MG capsule Take 30 mg by mouth daily.     [provider]  diclofenac sodium (VOLTAREN) 1 % GEL Apply 4 g topically 3 (three) times daily as needed (for back pain). 11/10/17   Rory Percy, DO  diltiazem (CARDIZEM CD) 180 MG 24 hr capsule Take 1 capsule (180 mg total) by mouth daily. 12/11/17   Barrett, Evelene Croon, PA-C  glucose blood (PRECISION QID TEST) test strip Check glucose twice daily 10/24/13   [provider]  HYDROcodone-acetaminophen (NORCO/VICODIN) 5-325 MG tablet Take by mouth every 8 (eight) hours as needed.  12/13/17   [provider]  hydrocortisone cream 1 % Apply to affected  area 2 times daily 12/24/17   Maczis, Barth Kirks, PA-C  Melatonin 3 MG TABS Take 1 tablet (3 mg total) by mouth at bedtime. 11/16/17   Anderson, Chelsey L, DO  metFORMIN (GLUCOPHAGE) 1000 MG tablet Take 1,000 mg by mouth 2 (two) times daily with a meal.     [provider]  Multiple Vitamin (MULTIVITAMIN WITH MINERALS) TABS tablet Take 1 tablet by mouth 2 (two) times daily.     [provider]  nitroGLYCERIN (NITROSTAT) 0.4 MG SL tablet Place 1 tablet (0.4 mg total) under the tongue every 5 (five) minutes x 3 doses as needed for chest pain. 11/19/15   Erlene Quan, PA-C  nystatin cream (MYCOSTATIN) Apply to affected area 2 times daily 01/02/18   Deno Etienne, DO  pantoprazole (PROTONIX) 40 MG tablet Take 1 tablet (40 mg total) by mouth 2 (two) times daily before a meal. 11/30/16   Domenic Polite, MD  polyethylene glycol [facility] / Floria Raveling) packet Take 17 g by mouth as needed for moderate constipation. 11/10/17   Rory Percy, DO  pravastatin (PRAVACHOL) 40 MG tablet Take 1 tablet (40 mg total) by mouth  every evening. 05/04/17   Leonie Man, MD  tamsulosin (FLOMAX) 0.4 MG CAPS capsule Take 0.4 mg by mouth daily. 02/20/17   [provider]    Family History Family History  Problem Relation Age of Onset  . Cancer Mother   . Heart attack Father   . Heart attack Brother     Social History Social History   Tobacco Use  . Smoking status: Current Every Day Smoker    Packs/day: 0.50    Years: 57.00    Pack years: 28.50    Types: Cigarettes  . Smokeless tobacco: Never Used  . Tobacco comment: He's been smoking between 0.5 and 2 ppd since age 23.  Substance Use Topics  . Alcohol use: No    Alcohol/week: 0.0 standard drinks    Comment: "quit drinking in 1986"  . Drug use: No     Allergies   Cyclobenzaprine   Review of Systems Review of Systems  Constitutional: Negative for chills and fever.  HENT: Negative for congestion and facial swelling.   Eyes: Negative for discharge and visual disturbance.  Respiratory: Negative for shortness of breath.   Cardiovascular: Negative for chest pain and palpitations.  Gastrointestinal: Negative for abdominal pain, diarrhea and vomiting.  Musculoskeletal: Negative for arthralgias and myalgias.  Skin: Positive for itching and rash. Negative for color change.  Neurological: Negative for tremors, syncope and headaches.  Psychiatric/Behavioral: Negative for confusion and dysphoric mood.     Physical Exam Updated Vital Signs There were no vitals taken for this visit.  Physical Exam  Constitutional: He is oriented to person, place, and time. He appears well-developed and well-nourished.  HENT:  Head: Normocephalic and atraumatic.  Eyes: Pupils are equal, round, and reactive to light. EOM are normal.  Neck: Normal range of motion. Neck supple. No JVD present.  Cardiovascular: Normal rate and regular rhythm. Exam reveals no gallop and no friction rub.  No murmur heard. Pulmonary/Chest: No respiratory distress. He has no  wheezes.  Abdominal: He exhibits no distension. There is no rebound and no guarding.  Genitourinary:  Genitourinary Comments: Mild erythema to the dorsal aspect of the head of the penis.   Musculoskeletal: Normal range of motion.  Neurological: He is alert and oriented to person, place, and time.  Skin: Rash noted. No pallor.  Patchy with central clearing  to the face.  Some golden colored crust under the chin.  Not warm.    Psychiatric: He has a normal mood and affect. His behavior is normal.  Nursing note and vitals reviewed.    ED Treatments / Results  Labs (all labs ordered are listed, but only abnormal results are displayed) Labs Reviewed - No data to display  EKG None  Radiology No results found.  Procedures Procedures (including critical care time)  Medications Ordered in ED Medications - No data to display   Initial Impression / Assessment and Plan / ED Course  I have reviewed the triage vital signs and the nursing notes.  Pertinent labs & imaging results that were available during my care of the patient were reviewed by me and considered in my medical decision making (see chart for details).     77 yo M with a cc of a rash.  Going on for the past month.  This is the patient's third visit to the ED for the same.  Clinically the rash appears to be fungal.  He has erythematous rings with central clearing and has some scattered satellite lesions at the base of the neck.  He does have a couple honey colored crust lesions to the underneath of the chin.  He has a rash that appears to be different to the dorsal aspect of his penis.  We will treat as possible fungal.  With the fact that the rash has not cleared over a month patient likely needs to follow-up with his PCP as he has not done this yet for this.  To the length the rash they may consider sending him to a dermatologist.  6:39 PM:  I have discussed the diagnosis/risks/treatment options with the patient and believe the  pt to be eligible for discharge home to follow-up with PCP. We also discussed returning to the ED immediately if new or worsening sx occur. We discussed the sx which are most concerning (e.g., sudden worsening pain, fever, inability to tolerate by mouth, urethral or rectal pain) that necessitate immediate return. Medications administered to the patient during their visit and any new prescriptions provided to the patient are listed below.  Medications given during this visit Medications - No data to display    The patient appears reasonably screen and/or stabilized for discharge and I doubt any other medical condition or other [facility] requiring further screening, evaluation, or treatment in the ED at this time prior to discharge.    Final Clinical Impressions(s) / ED Diagnoses   Final diagnoses:  Rash    ED Discharge Orders         Ordered    nystatin cream (MYCOSTATIN)     01/02/18 [place], Glenn, DO 01/02/18 1839

## 2018-01-04 ENCOUNTER — Encounter (HOSPITAL_COMMUNITY): Payer: Medicare Other

## 2018-01-07 ENCOUNTER — Inpatient Hospital Stay (HOSPITAL_COMMUNITY): Admission: RE | Admit: 2018-01-07 | Payer: Medicare Other | Source: Ambulatory Visit

## 2018-01-08 ENCOUNTER — Encounter (HOSPITAL_COMMUNITY): Payer: Self-pay | Admitting: Emergency Medicine

## 2018-01-08 ENCOUNTER — Other Ambulatory Visit: Payer: Self-pay

## 2018-01-08 ENCOUNTER — Emergency Department (HOSPITAL_COMMUNITY)
Admission: EM | Admit: 2018-01-08 | Discharge: 2018-01-08 | Disposition: A | Discharge status: Home / Self Care | Payer: Medicare Other | Attending: Emergency Medicine | Admitting: Emergency Medicine

## 2018-01-08 ENCOUNTER — Telehealth: Payer: Self-pay | Admitting: Hematology & Oncology

## 2018-01-08 DIAGNOSIS — E119 Type 2 diabetes mellitus without complications: Secondary | ICD-10-CM | POA: Insufficient documentation

## 2018-01-08 DIAGNOSIS — L0100 Impetigo, unspecified: Secondary | ICD-10-CM | POA: Insufficient documentation

## 2018-01-08 DIAGNOSIS — N481 Balanitis: Secondary | ICD-10-CM | POA: Insufficient documentation

## 2018-01-08 DIAGNOSIS — I2510 Atherosclerotic heart disease of native coronary artery without angina pectoris: Secondary | ICD-10-CM | POA: Insufficient documentation

## 2018-01-08 DIAGNOSIS — Z79899 Other long term (current) drug therapy: Secondary | ICD-10-CM | POA: Diagnosis not present

## 2018-01-08 DIAGNOSIS — J449 Chronic obstructive pulmonary disease, unspecified: Secondary | ICD-10-CM | POA: Insufficient documentation

## 2018-01-08 DIAGNOSIS — Z794 Long term (current) use of insulin: Secondary | ICD-10-CM | POA: Insufficient documentation

## 2018-01-08 DIAGNOSIS — L299 Pruritus, unspecified: Secondary | ICD-10-CM | POA: Diagnosis present

## 2018-01-08 DIAGNOSIS — Z7982 Long term (current) use of aspirin: Secondary | ICD-10-CM | POA: Insufficient documentation

## 2018-01-08 LAB — GC/CHLAMYDIA PROBE AMP (~~LOC~~) NOT AT ARMC
Chlamydia: NEGATIVE
Neisseria Gonorrhea: NEGATIVE

## 2018-01-08 LAB — URINALYSIS, ROUTINE W REFLEX MICROSCOPIC
Bilirubin Urine: NEGATIVE
Glucose, UA: NEGATIVE mg/dL
Ketones, ur: NEGATIVE mg/dL
Nitrite: NEGATIVE
PH: 5 (ref 5.0–8.0)
Protein, ur: NEGATIVE mg/dL
SPECIFIC GRAVITY, URINE: 1.01 (ref 1.005–1.030)

## 2018-01-08 LAB — CBG MONITORING, ED: Glucose-Capillary: 105 mg/dL — ABNORMAL HIGH (ref 70–99)

## 2018-01-08 MED ORDER — FLUCONAZOLE 150 MG PO TABS
150.0000 mg | ORAL_TABLET | Freq: Once | ORAL | Status: AC
  Administered 2018-01-08: 150 mg via ORAL
  Filled 2018-01-08: qty 1

## 2018-01-08 MED ORDER — CEPHALEXIN 250 MG PO CAPS
500.0000 mg | ORAL_CAPSULE | Freq: Once | ORAL | Status: AC
  Administered 2018-01-08: 500 mg via ORAL
  Filled 2018-01-08: qty 2

## 2018-01-08 MED ORDER — CEPHALEXIN 500 MG PO CAPS: 500.0000 mg | ORAL_CAPSULE | Freq: Four times a day (QID) | ORAL | 0 refills | Status: DC

## 2018-01-08 MED ORDER — FLUCONAZOLE 150 MG PO TABS: 150.0000 mg | ORAL_TABLET | Freq: Once | ORAL | 0 refills | Status: AC

## 2018-01-08 NOTE — ED Notes (Signed)
PT states understanding of care given, follow up care, and medication prescribed. PT ambulated from ED to car with a steady gait. 

## 2018-01-08 NOTE — Telephone Encounter (Signed)
lmom for pt to return call to office to sch iron infusion per sch msg

## 2018-01-08 NOTE — ED Triage Notes (Signed)
Per pt he has had an irritation on his face and now it is on his private area. Says it is painful and itches. Painful to urinate also.

## 2018-01-08 NOTE — ED Provider Notes (Signed)
TIME SEEN: 4:42 AM  CHIEF COMPLAINT: Rash  HPI: Patient is a 77 year old male with history of CAD, COPD, hypertension, diabetes who presents to the emergency department with a pruritic rash to his chin as well as to the dorsal aspect of his penis.  States this is been ongoing for over a month.  Was seen in the emergency department for the same on 01/02/2018 and discharged with nystatin.  States symptoms have not been improving.  Has reported some discharge from the tip of his penis.  No history of STDs.  Reports he has had dysuria.  Denies being sexually active.  No fever.  ROS: See HPI Constitutional: no fever  Eyes: no drainage  ENT: no runny nose   Cardiovascular:  no chest pain  Resp: no SOB  GI: no vomiting GU: no dysuria Integumentary:  rash  Allergy: no hives  Musculoskeletal: no leg swelling  Neurological: no slurred speech ROS otherwise negative  PAST MEDICAL HISTORY/PAST SURGICAL HISTORY:  Past Medical History:  Diagnosis Date  . Anemia    takes Ferrous Sulfate daily  . Arthritis    "all over"  . Balance problem 01/2014  . CAP (community acquired pneumonia) 09/18/2014  . Cervical radiculopathy due to degenerative joint disease of spine   . COPD (chronic obstructive pulmonary disease) ([place])   . Coronary artery disease, non-occlusive    a. 03/2010 Nonocclusive disease by cath, performed for ST elevations on ECG;  b. 06/2013 Lexi MV: EF 60%, no ischemia.  . Diabetes mellitus type II    takes Metformin and Lantus daily  . Diastolic CHF, chronic ([place])    a. 12/2012 EF 55-60%, diast dysfxn, triv MR, mildly dil LA/RA.  [name] History of blood transfusion 1982   "when I had stomach OR"  . History of bronchitis    1998  . History of gastric ulcer   . HTN (hypertension)    takes Diltiazem daily  . Hyperlipidemia    takes Pravastatin daily  . Joint pain   . PAF (paroxysmal atrial fibrillation) (HCC)    Recurrent after atrial flutter (a. 07/2010 Status post caval tricuspid isthmus  ablation by Dr. Midge Aver Metoprolol daily), currently controlled on flecainide plus diltiazem and Coumadin  . Weakness    numbness and tingling both hands    MEDICATIONS:  Prior to Admission medications   Medication Sig Start Date End Date Taking? Authorizing Provider  acetaminophen (TYLENOL) 325 MG tablet Take 325 mg by mouth every 6 (six) hours as needed for mild pain.    [provider]  albuterol (PROVENTIL HFA;VENTOLIN HFA) 108 (90 Base) MCG/ACT inhaler Inhale 2 puffs into the lungs every 4 (four) hours as needed for wheezing or shortness of breath. 10/30/17   Palumbo, April, MD  aspirin EC 81 MG tablet Take 81 mg by mouth daily.    [provider]  co-enzyme Q-10 30 MG capsule Take 30 mg by mouth daily.     [provider]  diclofenac sodium (VOLTAREN) 1 % GEL Apply 4 g topically 3 (three) times daily as needed (for back pain). 11/10/17   Rory Percy, DO  diltiazem (CARDIZEM CD) 180 MG 24 hr capsule Take 1 capsule (180 mg total) by mouth daily. 12/11/17   Barrett, Evelene Croon, PA-C  glucose blood (PRECISION QID TEST) test strip Check glucose twice daily 10/24/13   [provider]  HYDROcodone-acetaminophen (NORCO/VICODIN) 5-325 MG tablet Take by mouth every 8 (eight) hours as needed.  12/13/17   [provider]  hydrocortisone cream 1 % Apply to affected area 2 times daily 12/24/17   Maczis, Barth Kirks, PA-C  Melatonin 3 MG TABS Take 1 tablet (3 mg total) by mouth at bedtime. 11/16/17   Anderson, Chelsey L, DO  metFORMIN (GLUCOPHAGE) 1000 MG tablet Take 1,000 mg by mouth 2 (two) times daily with a meal.     [provider]  Multiple Vitamin (MULTIVITAMIN WITH MINERALS) TABS tablet Take 1 tablet by mouth 2 (two) times daily.     [provider]  nitroGLYCERIN (NITROSTAT) 0.4 MG SL tablet Place 1 tablet (0.4 mg total) under the tongue every 5 (five) minutes x 3 doses as needed for chest pain. 11/19/15   Erlene Quan, PA-C  nystatin  cream (MYCOSTATIN) Apply to affected area 2 times daily 01/02/18   Deno Etienne, DO  pantoprazole (PROTONIX) 40 MG tablet Take 1 tablet (40 mg total) by mouth 2 (two) times daily before a meal. 11/30/16   Domenic Polite, MD  polyethylene glycol [facility] / Floria Raveling) packet Take 17 g by mouth as needed for moderate constipation. 11/10/17   Rory Percy, DO  pravastatin (PRAVACHOL) 40 MG tablet Take 1 tablet (40 mg total) by mouth every evening. 05/04/17   Leonie Man, MD  tamsulosin (FLOMAX) 0.4 MG CAPS capsule Take 0.4 mg by mouth daily. 02/20/17   [provider]    ALLERGIES:  Allergies  Allergen Reactions  . Cyclobenzaprine Other (See Comments)    Unsteady gait    SOCIAL HISTORY:  Social History   Tobacco Use  . Smoking status: Current Every Day Smoker    Packs/day: 0.50    Years: 57.00    Pack years: 28.50    Types: Cigarettes  . Smokeless tobacco: Never Used  . Tobacco comment: He's been smoking between 0.5 and 2 ppd since age 82.  Substance Use Topics  . Alcohol use: No    Alcohol/week: 0.0 standard drinks    Comment: "quit drinking in 1986"    FAMILY HISTORY: Family History  Problem Relation Age of Onset  . Cancer Mother   . Heart attack Father   . Heart attack Brother     EXAM: BP (!) 155/92 (BP Location: Right Arm)   Pulse (!) 110   Temp 97.9 F (36.6 C) (Oral)   Ht 5\' 6"  (1.676 m)   Wt 78.2 kg   SpO2 99%   BMI 27.84 kg/m  CONSTITUTIONAL: Alert and oriented and responds appropriately to questions. Well-appearing; well-nourished HEAD: Normocephalic EYES: Conjunctivae clear, pupils appear equal, EOMI ENT: normal nose; moist mucous membranes NECK: Supple, no meningismus, no nuchal rigidity, no LAD  CARD: RRR; S1 and S2 appreciated; no murmurs, no clicks, no rubs, no gallops RESP: Normal chest excursion without splinting or tachypnea; breath sounds clear and equal bilaterally; no wheezes, no rhonchi, no rales, no hypoxia or respiratory distress,  speaking full sentences ABD/GI: Normal bowel sounds; non-distended; soft, non-tender, no rebound, no guarding, no peritoneal signs, no hepatosplenomegaly GU: Circumcised male, patient has some erythema to the glans of the penis with mild inflammation with some white appearing drainage around the tip of his penis but it is not coming out of the urethra, foreskin appears normal without phimosis or paraphimosis, scrotum appears normal without testicular tenderness BACK:  The back appears normal and is non-tender to palpation, there is no CVA tenderness EXT: Normal ROM in all joints; non-tender to palpation; no edema; normal capillary refill; no cyanosis, no calf tenderness or swelling  SKIN: Normal color for age and race; warm; scattered slightly erythematous patchy macular rash with some crusting noted around the chin and without induration or drainage, no petechiae or purpura, no blisters or desquamation NEURO: Moves all extremities equally PSYCH: The patient's mood and manner are appropriate. Grooming and personal hygiene are appropriate.  MEDICAL DECISION MAKING: Patient here with what appears to be balanitis.  He is on nystatin which is not improving his symptoms.  He complains of dysuria and has some blood in his urine.  Urine culture has been sent.  He denies history of STDs but will check him for gonorrhea and chlamydia today.  We will discharge him on Keflex for possible UTI as well as possible bacterial causes of his rash including impetigo of his face.  We will also put him on Diflucan for his balanitis.  He has a PCP for outpatient follow-up.  No sign of any life-threatening illness today.  Will check his blood glucose prior to discharge.  ED PROGRESS: Blood glucose is normal at 105.  Will discharge home with prescription of Diflucan, Keflex.  He has nystatin that he will continue to use.  He has a PCP for follow-up.   At this time, I do not feel there is any life-threatening condition  present. I have reviewed and discussed all results (EKG, imaging, lab, urine as appropriate) and exam findings with patient/family. I have reviewed nursing notes and appropriate previous records.  I feel the patient is safe to be discharged home without further emergent workup and can continue workup as an outpatient as needed. Discussed usual and customary return precautions. Patient/family verbalize understanding and are comfortable with this plan.  Outpatient follow-up has been provided if needed. All questions have been answered.      Kethan Papadopoulos, Delice Bison, DO 01/08/18 [phone]

## 2018-01-09 ENCOUNTER — Emergency Department (HOSPITAL_BASED_OUTPATIENT_CLINIC_OR_DEPARTMENT_OTHER): Payer: Medicare Other

## 2018-01-09 ENCOUNTER — Emergency Department (HOSPITAL_BASED_OUTPATIENT_CLINIC_OR_DEPARTMENT_OTHER)
Admission: EM | Admit: 2018-01-09 | Discharge: 2018-01-09 | Disposition: A | Discharge status: Home / Self Care | Payer: Medicare Other | Attending: Emergency Medicine | Admitting: Emergency Medicine

## 2018-01-09 ENCOUNTER — Other Ambulatory Visit: Payer: Self-pay

## 2018-01-09 ENCOUNTER — Encounter (HOSPITAL_BASED_OUTPATIENT_CLINIC_OR_DEPARTMENT_OTHER): Payer: Self-pay | Admitting: Emergency Medicine

## 2018-01-09 DIAGNOSIS — I110 Hypertensive heart disease with heart failure: Secondary | ICD-10-CM | POA: Diagnosis not present

## 2018-01-09 DIAGNOSIS — I959 Hypotension, unspecified: Secondary | ICD-10-CM | POA: Diagnosis not present

## 2018-01-09 DIAGNOSIS — Z794 Long term (current) use of insulin: Secondary | ICD-10-CM | POA: Diagnosis not present

## 2018-01-09 DIAGNOSIS — Z7982 Long term (current) use of aspirin: Secondary | ICD-10-CM | POA: Insufficient documentation

## 2018-01-09 DIAGNOSIS — I5032 Chronic diastolic (congestive) heart failure: Secondary | ICD-10-CM | POA: Insufficient documentation

## 2018-01-09 DIAGNOSIS — I2510 Atherosclerotic heart disease of native coronary artery without angina pectoris: Secondary | ICD-10-CM | POA: Insufficient documentation

## 2018-01-09 DIAGNOSIS — F17210 Nicotine dependence, cigarettes, uncomplicated: Secondary | ICD-10-CM | POA: Diagnosis not present

## 2018-01-09 DIAGNOSIS — R531 Weakness: Secondary | ICD-10-CM | POA: Diagnosis not present

## 2018-01-09 DIAGNOSIS — E119 Type 2 diabetes mellitus without complications: Secondary | ICD-10-CM | POA: Insufficient documentation

## 2018-01-09 DIAGNOSIS — R4182 Altered mental status, unspecified: Secondary | ICD-10-CM | POA: Diagnosis not present

## 2018-01-09 DIAGNOSIS — Z79899 Other long term (current) drug therapy: Secondary | ICD-10-CM | POA: Insufficient documentation

## 2018-01-09 DIAGNOSIS — R110 Nausea: Secondary | ICD-10-CM | POA: Diagnosis not present

## 2018-01-09 DIAGNOSIS — I10 Essential (primary) hypertension: Secondary | ICD-10-CM | POA: Diagnosis not present

## 2018-01-09 DIAGNOSIS — R799 Abnormal finding of blood chemistry, unspecified: Secondary | ICD-10-CM | POA: Diagnosis not present

## 2018-01-09 DIAGNOSIS — J449 Chronic obstructive pulmonary disease, unspecified: Secondary | ICD-10-CM | POA: Diagnosis not present

## 2018-01-09 DIAGNOSIS — I499 Cardiac arrhythmia, unspecified: Secondary | ICD-10-CM | POA: Diagnosis not present

## 2018-01-09 DIAGNOSIS — R404 Transient alteration of awareness: Secondary | ICD-10-CM | POA: Diagnosis not present

## 2018-01-09 LAB — URINALYSIS, MICROSCOPIC (REFLEX)

## 2018-01-09 LAB — CBC
HEMATOCRIT: 33 % — AB (ref 39.0–52.0)
HEMOGLOBIN: 10.4 g/dL — AB (ref 13.0–17.0)
MCH: 29.1 pg (ref 26.0–34.0)
MCHC: 31.5 g/dL (ref 30.0–36.0)
MCV: 92.2 fL (ref 80.0–100.0)
Platelets: 227 10*3/uL (ref 150–400)
RBC: 3.58 MIL/uL — AB (ref 4.22–5.81)
RDW: 18.1 % — ABNORMAL HIGH (ref 11.5–15.5)
WBC: 8.9 10*3/uL (ref 4.0–10.5)
nRBC: 0 % (ref 0.0–0.2)

## 2018-01-09 LAB — URINE CULTURE: Culture: NO GROWTH

## 2018-01-09 LAB — BASIC METABOLIC PANEL
ANION GAP: 12 (ref 5–15)
BUN: 24 mg/dL — AB (ref 8–23)
CHLORIDE: 101 mmol/L (ref 98–111)
CO2: 23 mmol/L (ref 22–32)
Calcium: 8.2 mg/dL — ABNORMAL LOW (ref 8.9–10.3)
Creatinine, Ser: 1.26 mg/dL — ABNORMAL HIGH (ref 0.61–1.24)
GFR calc Af Amer: >60 mL/min (ref >60)
GFR calc non Af Amer: 53 mL/min — ABNORMAL LOW (ref >60)
GLUCOSE: 135 mg/dL — AB (ref 70–99)
POTASSIUM: 3.6 mmol/L (ref 3.5–5.1)
Sodium: 136 mmol/L (ref 135–145)

## 2018-01-09 LAB — URINALYSIS, ROUTINE W REFLEX MICROSCOPIC
BILIRUBIN URINE: NEGATIVE
Glucose, UA: NEGATIVE mg/dL
Ketones, ur: 15 mg/dL — AB
NITRITE: NEGATIVE
PH: 6 (ref 5.0–8.0)
Protein, ur: NEGATIVE mg/dL
SPECIFIC GRAVITY, URINE: 1.015 (ref 1.005–1.030)

## 2018-01-09 LAB — TROPONIN I

## 2018-01-09 NOTE — ED Provider Notes (Signed)
[facility] EMERGENCY DEPARTMENT Provider Note   CSN: [id number] Arrival date & time: 01/09/18  1000     History   Chief Complaint Chief Complaint  Patient presents with  . Abnormal Lab  . Weakness    HPI [name] is a 77 y.o. male.  HPI Patient reports he has felt achy and generally weak for several days.  He is wondering if his iron is low.  He reports often this is how he feels if his iron is low.  He reports he aches in his chest abdomen and extremities.  This is diffuse and generalized.  He endorses slight nausea but denies he has had any vomiting or diarrhea.  He reports he might feel constipated.  His last bowel movement was about 4 days ago.  Patient has a chronic atrial fibrillation.  He reports that he was taken off of all blood thinners due to prior episodes of anemia.  Denies he is having any chest pain.  He denies shortness of breath.  He does report he has had a cough for a while.  He denies mucus or blood.  Denies pain in his calves or lower extremities.  No fever that he is aware of. Past Medical History:  Diagnosis Date  . Anemia    takes Ferrous Sulfate daily  . Arthritis    "all over"  . Balance problem 01/2014  . CAP (community acquired pneumonia) 09/18/2014  . Cervical radiculopathy due to degenerative joint disease of spine   . COPD (chronic obstructive pulmonary disease) ([place])   . Coronary artery disease, non-occlusive    a. 03/2010 Nonocclusive disease by cath, performed for ST elevations on ECG;  b. 06/2013 Lexi MV: EF 60%, no ischemia.  . Diabetes mellitus type II    takes Metformin and Lantus daily  . Diastolic CHF, chronic ([place])    a. 12/2012 EF 55-60%, diast dysfxn, triv MR, mildly dil LA/RA.  [name] History of blood transfusion 1982   "when I had stomach OR"  . History of bronchitis    1998  . History of gastric ulcer   . HTN (hypertension)    takes Diltiazem daily  . Hyperlipidemia    takes Pravastatin daily  . Joint pain   .  PAF (paroxysmal atrial fibrillation) (H[facility])    Recurrent after atrial flutter (a. 07/2010 Status post caval tricuspid isthmus ablation by Dr. Midge Aver Metoprolol daily), currently controlled on flecainide plus diltiazem and Coumadin  . Weakness    numbness and tingling both hands    Patient Active Problem List   Diagnosis Date Noted  . Atrial fibrillation with RVR ([place]) 11/08/2017  . Chest pain with moderate risk of acute coronary syndrome 10/04/2017  . HTN (hypertension) 05/21/2017  . Diabetes mellitus type II 05/21/2017  . Diastolic CHF, chronic ([place]) 05/21/2017  . COPD (chronic obstructive pulmonary disease) ([place]) 05/21/2017  . H/O atrial flutter 05/21/2017  . Coronary disease/nonobstructive 05/21/2017  . Acute blood loss anemia 10/02/2016  . Abdominal pain 10/02/2016  . Constipation 10/02/2016  . Left lower quadrant pain   . Coagulopathy ([place])   . Heme positive stool   . Symptomatic anemia 07/10/2016  . Syncope 05/15/2015  . Cervical disc disease 05/15/2015  . Abnormal urinalysis 05/15/2015  . Anemia 05/15/2015  . History of CVA (cerebrovascular accident) 04/03/2014  . Near syncope 02/15/2014  . Pre-syncope 02/15/2014  . Benign neoplasm of rectum and anal canal 12/02/2013  . Upper GI bleeding 11/30/2013  . Tobacco use disorder 11/30/2013  .  Anticoagulation goal of INR 2 to 3, for PAF - CHA2DS2Vasc = 7 08/15/2013  . Type 2 diabetes mellitus ([place]) 01/22/2013  . Tobacco abuse counseling 12/15/2012  . Hyperlipidemia   . Obesity (BMI 30-39.9) 12/14/2012  . Persistent atrial fibrillation ([place]) 10/07/2010  . Atrial flutter - status post CTI ablation 07/29/2010  . Essential hypertension 07/29/2010  . Chronic diastolic congestive heart failure ([place]) 07/29/2010  . Coronary artery disease, non-occlusive 07/29/2010    Past Surgical History:  Procedure Laterality Date  . ATRIAL ABLATION SURGERY  08/05/10   CTI ablation for atrial flutter by JA  . CARDIAC CATHETERIZATION  2012    nl LV function, no occlusive CAD, PAF  . CARDIOVERSION  12/07/2010    Successful direct current cardioversion with atrial fibrillation to normal sinus rhythm  . CARPAL TUNNEL RELEASE Bilateral 01/30/2014   Procedure: BILATERAL CARPAL TUNNEL RELEASE;  Surgeon: Marianna Payment, MD;  Location: [facility];  Service: Orthopedics;  Laterality: Bilateral;  . CATARACT EXTRACTION W/ INTRAOCULAR LENS  IMPLANT, BILATERAL Bilateral   . COLONOSCOPY [name] 12/02/2013   Procedure: COLONOSCOPY;  Surgeon: Irene Shipper, MD;  Location: [facility];  Service: Endoscopy;  Laterality: [name];  . ESOPHAGOGASTRODUODENOSCOPY [name] 09/22/2014   Procedure: ESOPHAGOGASTRODUODENOSCOPY (EGD);  Surgeon: Ronald Lobo, MD;  Location[facility] ENDOSCOPY;  Service: Endoscopy;  Laterality: [name];  . ESOPHAGOGASTRODUODENOSCOPY [name] 07/11/2016   Procedure: ESOPHAGOGASTRODUODENOSCOPY (EGD);  Surgeon: Doran Stabler, MD;  Location: [facility] ENDOSCOPY;  Service: Endoscopy;  Laterality: [name];  . INCISION AND DRAINAGE ABSCESS / HEMATOMA OF BURSA / KNEE / THIGH Left 1998   knee  . KNEE BURSECTOMY Left 1998  . LAPAROSCOPIC CHOLECYSTECTOMY  03/2010  . LEFT HEART CATH AND CORONARY ANGIOGRAPHY [name] 10/05/2017   Procedure: LEFT HEART CATH AND CORONARY ANGIOGRAPHY;  Surgeon: Martinique, Peter M, MD;  Location: [facility] CV LAB;  Service: Cardiovascular;  Laterality: [name];  . NM MYOVIEW LTD  07/22/2013   Normal EF ~60%, no ischemia or infarction.  [name] PARTIAL GASTRECTOMY  1982   subtotal; "took out 30% for ulcers"  . TRANSTHORACIC ECHOCARDIOGRAM  02/16/2014   EF 60%, no RWMA. - otherwise normal  . YAG LASER APPLICATION Bilateral         Home Medications    Prior to Admission medications   Medication Sig Start Date End Date Taking? Authorizing Provider  acetaminophen (TYLENOL) 325 MG tablet Take 325 mg by mouth every 6 (six) hours as needed for mild pain.    [provider]  albuterol (PROVENTIL HFA;VENTOLIN HFA) 108 (90 Base) MCG/ACT inhaler Inhale 2  puffs into the lungs every 4 (four) hours as needed for wheezing or shortness of breath. 10/30/17   Palumbo, April, MD  aspirin EC 81 MG tablet Take 81 mg by mouth daily.    [provider]  cephALEXin (KEFLEX) 500 MG capsule Take 1 capsule (500 mg total) by mouth 4 (four) times daily. 01/08/18   Ward, Delice Bison, DO  co-enzyme Q-10 30 MG capsule Take 30 mg by mouth daily.     [provider]  diclofenac sodium (VOLTAREN) 1 % GEL Apply 4 g topically 3 (three) times daily as needed (for back pain). 11/10/17   Rory Percy, DO  diltiazem (CARDIZEM CD) 180 MG 24 hr capsule Take 1 capsule (180 mg total) by mouth daily. 12/11/17   Barrett, Evelene Croon, PA-C  glucose blood (PRECISION QID TEST) test strip Check glucose twice daily 10/24/13   [provider]  HYDROcodone-acetaminophen (NORCO/VICODIN) 5-325 MG  tablet Take by mouth every 8 (eight) hours as needed.  12/13/17   [provider]  hydrocortisone cream 1 % Apply to affected area 2 times daily 12/24/17   Maczis, Barth Kirks, PA-C  Melatonin 3 MG TABS Take 1 tablet (3 mg total) by mouth at bedtime. 11/16/17   Anderson, Chelsey L, DO  metFORMIN (GLUCOPHAGE) 1000 MG tablet Take 1,000 mg by mouth 2 (two) times daily with a meal.     [provider]  Multiple Vitamin (MULTIVITAMIN WITH MINERALS) TABS tablet Take 1 tablet by mouth 2 (two) times daily.     [provider]  nitroGLYCERIN (NITROSTAT) 0.4 MG SL tablet Place 1 tablet (0.4 mg total) under the tongue every 5 (five) minutes x 3 doses as needed for chest pain. 11/19/15   Erlene Quan, PA-C  nystatin cream (MYCOSTATIN) Apply to affected area 2 times daily 01/02/18   Deno Etienne, DO  pantoprazole (PROTONIX) 40 MG tablet Take 1 tablet (40 mg total) by mouth 2 (two) times daily before a meal. 11/30/16   Domenic Polite, MD  polyethylene glycol [facility] Floria Raveling) packet Take 17 g by mouth as needed for moderate constipation. 11/10/17   Rory Percy, DO    pravastatin (PRAVACHOL) 40 MG tablet Take 1 tablet (40 mg total) by mouth every evening. 05/04/17   Leonie Man, MD  tamsulosin (FLOMAX) 0.4 MG CAPS capsule Take 0.4 mg by mouth daily. 02/20/17   [provider]    Family History Family History  Problem Relation Age of Onset  . Cancer Mother   . Heart attack Father   . Heart attack Brother     Social History Social History   Tobacco Use  . Smoking status: Current Every Day Smoker    Packs/day: 0.50    Years: 57.00    Pack years: 28.50    Types: Cigarettes  . Smokeless tobacco: Never Used  . Tobacco comment: He's been smoking between 0.5 and 2 ppd since age 72.  Substance Use Topics  . Alcohol use: No    Alcohol/week: 0.0 standard drinks    Comment: "quit drinking in 1986"  . Drug use: No     Allergies   Cyclobenzaprine   Review of Systems Review of Systems 10 Systems reviewed and are negative for acute change except as noted in the HPI.   Physical Exam Updated Vital Signs BP 112/63   Pulse 94   Temp 98.3 F (36.8 C) (Oral)   Resp 18   Ht 5\' 6"  (1.676 m)   Wt 77.1 kg   SpO2 98%   BMI 27.44 kg/m   Physical Exam  Constitutional: He appears well-developed and well-nourished. No distress.  HENT:  Head: Normocephalic and atraumatic.  Mouth/Throat: Oropharynx is clear and moist.  Eyes: EOM are normal.  Neck: Neck supple.  Cardiovascular: Normal rate, regular rhythm, normal heart sounds and intact distal pulses.  Pulmonary/Chest: Effort normal and breath sounds normal.  Abdominal: Soft. He exhibits no distension. There is no tenderness. There is no guarding.  Musculoskeletal: Normal range of motion. He exhibits no edema or tenderness.  Neurological: He is alert. No cranial nerve deficit. He exhibits normal muscle tone. Coordination normal.  Skin: Skin is warm and dry.  Psychiatric: He has a normal mood and affect.     ED Treatments / Results  Labs (all labs ordered are listed, but only  abnormal results are displayed) Labs Reviewed  BASIC METABOLIC PANEL - Abnormal; Notable for the following  components:      Result Value   Glucose, Bld 135 (*)    BUN 24 (*)    Creatinine, Ser 1.26 (*)    Calcium 8.2 (*)    GFR calc non Af Amer 53 (*)    All other components within normal limits  CBC - Abnormal; Notable for the following components:   RBC 3.58 (*)    Hemoglobin 10.4 (*)    HCT 33.0 (*)    RDW 18.1 (*)    All other components within normal limits  URINALYSIS, ROUTINE W REFLEX MICROSCOPIC - Abnormal; Notable for the following components:   Hgb urine dipstick MODERATE (*)    Ketones, ur 15 (*)    Leukocytes, UA MODERATE (*)    All other components within normal limits  URINALYSIS, MICROSCOPIC (REFLEX) - Abnormal; Notable for the following components:   Bacteria, UA FEW (*)    All other components within normal limits  TROPONIN I    EKG EKG Interpretation  Date/Time:  Wednesday January 09 2018 10:21:41 EDT Ventricular Rate:  99 PR Interval:    QRS Duration: 81 QT Interval:  358 QTC Calculation: 460 R Axis:   11 Text Interpretation:  Atrial flutter Borderline ST elevation, anterior leads agree, similar to previous Confirmed by Charlesetta Shanks [phone]) on 01/09/2018 11:18:13 AM   Radiology Dg Chest 2 View  Result Date: 01/09/2018 CLINICAL DATA:  Weakness, nausea. EXAM: CHEST - 2 VIEW COMPARISON:  Radiographs of November 12, 2017. FINDINGS: The heart size and mediastinal contours are within normal limits. Both lungs are clear. The visualized skeletal structures are unremarkable. IMPRESSION: No active cardiopulmonary disease. Electronically Signed   By: Marijo Conception, M.D.   On: 01/09/2018 13:23    Procedures Procedures (including critical care time)  Medications Ordered in ED Medications - No data to display   Initial Impression / Assessment and Plan / ED Course  I have reviewed the triage vital signs and the nursing notes.  Pertinent labs & imaging  results that were available during my care of the patient were reviewed by me and considered in my medical decision making (see chart for details).    Patient does not have clinically ill appearance.  There are no diagnostic findings today to explain the patient's reports of generalized weakness.  Urinalysis from yesterday has negative growth on the culture.  I have advised the patient not to continue Keflex as he felt that it might be making him feel worse anyways.  Patient has chronic atrial fib\flutter.  He reports he has been told he cannot take his anticoagulant medications.  No signs of localizing symptoms that would suggest a cerebral event.  His mental status and cognitive function are normal.  At this time, unclear etiology of generalized weakness and all over pain.  Possibly viral illness.  Patient is not febrile.  At this time, I feel he is stable for continued outpatient management.  Final Clinical Impressions(s) / ED Diagnoses   Final diagnoses:  Generalized weakness    ED Discharge Orders    None       Charlesetta Shanks, MD 01/09/18 1343

## 2018-01-09 NOTE — ED Notes (Signed)
ED Provider at bedside. 

## 2018-01-09 NOTE — Discharge Instructions (Signed)
1.  Schedule a recheck with your doctor soon as possible.

## 2018-01-09 NOTE — ED Notes (Signed)
Pt c/o nausea, EDP made aware

## 2018-01-09 NOTE — ED Notes (Signed)
Patient transported to X-ray 

## 2018-01-09 NOTE — ED Notes (Signed)
NAD at this time. Pt is stable and going home.  

## 2018-01-09 NOTE — ED Triage Notes (Signed)
Pt states he needs an iron infusion and could not get an appt with Dr. Marin Olp. C/o weakness.

## 2018-01-11 ENCOUNTER — Other Ambulatory Visit: Payer: Self-pay

## 2018-01-11 ENCOUNTER — Encounter (HOSPITAL_COMMUNITY): Payer: Self-pay

## 2018-01-11 ENCOUNTER — Emergency Department (HOSPITAL_COMMUNITY): Payer: Medicare Other

## 2018-01-11 ENCOUNTER — Emergency Department (HOSPITAL_COMMUNITY)
Admission: EM | Admit: 2018-01-11 | Discharge: 2018-01-11 | Disposition: A | Discharge status: Home / Self Care | Payer: Medicare Other | Attending: Emergency Medicine | Admitting: Emergency Medicine

## 2018-01-11 DIAGNOSIS — I5032 Chronic diastolic (congestive) heart failure: Secondary | ICD-10-CM | POA: Insufficient documentation

## 2018-01-11 DIAGNOSIS — R0602 Shortness of breath: Secondary | ICD-10-CM | POA: Diagnosis not present

## 2018-01-11 DIAGNOSIS — I110 Hypertensive heart disease with heart failure: Secondary | ICD-10-CM | POA: Diagnosis not present

## 2018-01-11 DIAGNOSIS — Z7984 Long term (current) use of oral hypoglycemic drugs: Secondary | ICD-10-CM | POA: Diagnosis not present

## 2018-01-11 DIAGNOSIS — R52 Pain, unspecified: Secondary | ICD-10-CM | POA: Diagnosis not present

## 2018-01-11 DIAGNOSIS — F17210 Nicotine dependence, cigarettes, uncomplicated: Secondary | ICD-10-CM | POA: Insufficient documentation

## 2018-01-11 DIAGNOSIS — E119 Type 2 diabetes mellitus without complications: Secondary | ICD-10-CM | POA: Insufficient documentation

## 2018-01-11 DIAGNOSIS — R0789 Other chest pain: Secondary | ICD-10-CM | POA: Insufficient documentation

## 2018-01-11 DIAGNOSIS — R0781 Pleurodynia: Secondary | ICD-10-CM | POA: Diagnosis not present

## 2018-01-11 DIAGNOSIS — Z79899 Other long term (current) drug therapy: Secondary | ICD-10-CM | POA: Insufficient documentation

## 2018-01-11 DIAGNOSIS — R05 Cough: Secondary | ICD-10-CM | POA: Diagnosis not present

## 2018-01-11 DIAGNOSIS — Z7982 Long term (current) use of aspirin: Secondary | ICD-10-CM | POA: Insufficient documentation

## 2018-01-11 DIAGNOSIS — J449 Chronic obstructive pulmonary disease, unspecified: Secondary | ICD-10-CM | POA: Insufficient documentation

## 2018-01-11 DIAGNOSIS — R079 Chest pain, unspecified: Secondary | ICD-10-CM | POA: Diagnosis present

## 2018-01-11 DIAGNOSIS — R531 Weakness: Secondary | ICD-10-CM | POA: Diagnosis not present

## 2018-01-11 LAB — CBC WITH DIFFERENTIAL/PLATELET
Abs Immature Granulocytes: 0.01 10*3/uL (ref 0.00–0.07)
BASOS ABS: 0 10*3/uL (ref 0.0–0.1)
BASOS PCT: 1 %
Eosinophils Absolute: 0.2 10*3/uL (ref 0.0–0.5)
Eosinophils Relative: 5 %
HCT: 34.1 % — ABNORMAL LOW (ref 39.0–52.0)
Hemoglobin: 10.7 g/dL — ABNORMAL LOW (ref 13.0–17.0)
IMMATURE GRANULOCYTES: 0 %
Lymphocytes Relative: 14 %
Lymphs Abs: 0.5 10*3/uL — ABNORMAL LOW (ref 0.7–4.0)
MCH: 28.2 pg (ref 26.0–34.0)
MCHC: 31.4 g/dL (ref 30.0–36.0)
MCV: 90 fL (ref 80.0–100.0)
Monocytes Absolute: 0.4 10*3/uL (ref 0.1–1.0)
Monocytes Relative: 13 %
NEUTROS ABS: 2.3 10*3/uL (ref 1.7–7.7)
NEUTROS PCT: 67 %
NRBC: 0 % (ref 0.0–0.2)
Platelets: 294 10*3/uL (ref 150–400)
RBC: 3.79 MIL/uL — AB (ref 4.22–5.81)
RDW: 17.6 % — ABNORMAL HIGH (ref 11.5–15.5)
WBC: 3.5 10*3/uL — AB (ref 4.0–10.5)

## 2018-01-11 LAB — BASIC METABOLIC PANEL
ANION GAP: 10 (ref 5–15)
BUN: 13 mg/dL (ref 8–23)
CALCIUM: 8.7 mg/dL — AB (ref 8.9–10.3)
CO2: 22 mmol/L (ref 22–32)
Chloride: 106 mmol/L (ref 98–111)
Creatinine, Ser: 1.05 mg/dL (ref 0.61–1.24)
GFR calc Af Amer: >60 mL/min (ref >60)
Glucose, Bld: 134 mg/dL — ABNORMAL HIGH (ref 70–99)
Potassium: 3.9 mmol/L (ref 3.5–5.1)
SODIUM: 138 mmol/L (ref 135–145)

## 2018-01-11 LAB — I-STAT TROPONIN, ED: TROPONIN I, POC: 0 ng/mL (ref 0.00–0.08)

## 2018-01-11 MED ORDER — LIDOCAINE 5 % EX PTCH: 1.0000 | MEDICATED_PATCH | CUTANEOUS | 0 refills | Status: AC

## 2018-01-11 MED ORDER — LIDOCAINE 5 % EX PTCH
1.0000 | MEDICATED_PATCH | CUTANEOUS | Status: DC
  Administered 2018-01-11: 1 via TRANSDERMAL
  Filled 2018-01-11: qty 1

## 2018-01-11 NOTE — ED Provider Notes (Signed)
[facility] EMERGENCY DEPARTMENT Provider Note   CSN: [id number] Arrival date & time: 01/11/18  1104     History   Chief Complaint Chief Complaint  Patient presents with  . Muscle Pain    Rib Cage    HPI [name] is a 77 y.o. male.  The history is provided by the patient.  Chest Pain   The current episode started more than 1 week ago. The problem occurs every several days. The pain is associated with movement and raising an arm. The pain is present in the lateral region (left side of chest wall). The pain is at a severity of 3/10. The pain is mild. The quality of the pain is described as stabbing. The pain does not radiate. The symptoms are aggravated by certain positions. Pertinent negatives include no abdominal pain, no back pain, no cough, no fever, no palpitations, no shortness of breath and no vomiting.  His past medical history is significant for CAD, diabetes, hyperlipidemia and hypertension.  Pertinent negatives for past medical history include no seizures.    Past Medical History:  Diagnosis Date  . Anemia    takes Ferrous Sulfate daily  . Arthritis    "all over"  . Balance problem 01/2014  . CAP (community acquired pneumonia) 09/18/2014  . Cervical radiculopathy due to degenerative joint disease of spine   . COPD (chronic obstructive pulmonary disease) ([place])   . Coronary artery disease, non-occlusive    a. 03/2010 Nonocclusive disease by cath, performed for ST elevations on ECG;  b. 06/2013 Lexi MV: EF 60%, no ischemia.  . Diabetes mellitus type II    takes Metformin and Lantus daily  . Diastolic CHF, chronic ([place])    a. 12/2012 EF 55-60%, diast dysfxn, triv MR, mildly dil LA/RA.  [name] History of blood transfusion 1982   "when I had stomach OR"  . History of bronchitis    1998  . History of gastric ulcer   . HTN (hypertension)    takes Diltiazem daily  . Hyperlipidemia    takes Pravastatin daily  . Joint pain   . PAF (paroxysmal  atrial fibrillation) ([facility])    Recurrent after atrial flutter (a. 07/2010 Status post caval tricuspid isthmus ablation by Dr. Midge Aver Metoprolol daily), currently controlled on flecainide plus diltiazem and Coumadin  . Weakness    numbness and tingling both hands    Patient Active Problem List   Diagnosis Date Noted  . Atrial fibrillation with RVR ([place]) 11/08/2017  . Chest pain with moderate risk of acute coronary syndrome 10/04/2017  . HTN (hypertension) 05/21/2017  . Diabetes mellitus type II 05/21/2017  . Diastolic CHF, chronic ([place]) 05/21/2017  . COPD (chronic obstructive pulmonary disease) ([place]) 05/21/2017  . H/O atrial flutter 05/21/2017  . Coronary disease/nonobstructive 05/21/2017  . Acute blood loss anemia 10/02/2016  . Abdominal pain 10/02/2016  . Constipation 10/02/2016  . Left lower quadrant pain   . Coagulopathy ([place])   . Heme positive stool   . Symptomatic anemia 07/10/2016  . Syncope 05/15/2015  . Cervical disc disease 05/15/2015  . Abnormal urinalysis 05/15/2015  . Anemia 05/15/2015  . History of CVA (cerebrovascular accident) 04/03/2014  . Near syncope 02/15/2014  . Pre-syncope 02/15/2014  . Benign neoplasm of rectum and anal canal 12/02/2013  . Upper GI bleeding 11/30/2013  . Tobacco use disorder 11/30/2013  . Anticoagulation goal of INR 2 to 3, for PAF - CHA2DS2Vasc = 7 08/15/2013  . Type 2 diabetes mellitus ([place]) 01/22/2013  .  Tobacco abuse counseling 12/15/2012  . Hyperlipidemia   . Obesity (BMI 30-39.9) 12/14/2012  . Persistent atrial fibrillation ([place]) 10/07/2010  . Atrial flutter - status post CTI ablation 07/29/2010  . Essential hypertension 07/29/2010  . Chronic diastolic congestive heart failure ([place]) 07/29/2010  . Coronary artery disease, non-occlusive 07/29/2010    Past Surgical History:  Procedure Laterality Date  . ATRIAL ABLATION SURGERY  08/05/10   CTI ablation for atrial flutter by JA  . CARDIAC CATHETERIZATION  2012   nl LV  function, no occlusive CAD, PAF  . CARDIOVERSION  12/07/2010    Successful direct current cardioversion with atrial fibrillation to normal sinus rhythm  . CARPAL TUNNEL RELEASE Bilateral 01/30/2014   Procedure: BILATERAL CARPAL TUNNEL RELEASE;  Surgeon: Marianna Payment, MD;  Location: [facility];  Service: Orthopedics;  Laterality: Bilateral;  . CATARACT EXTRACTION W/ INTRAOCULAR LENS  IMPLANT, BILATERAL Bilateral   . COLONOSCOPY [name] 12/02/2013   Procedure: COLONOSCOPY;  Surgeon: Irene Shipper, MD;  Location: [facility];  Service: Endoscopy;  Laterality: [name];  . ESOPHAGOGASTRODUODENOSCOPY [name] 09/22/2014   Procedure: ESOPHAGOGASTRODUODENOSCOPY (EGD);  Surgeon: Ronald Lobo, MD;  Location[facility] END[facility]COPY;  Service: Endoscopy;  Laterality: [name];  . ESOPHAGOGASTRODUODENOSCOPY [name] 07/11/2016   Procedure: ESOPHAGOGASTRODUODENOSCOPY (EGD);  Surgeon: Doran Stabler, MD;  Location: [facility] ENDOSCOPY;  Service: Endoscopy;  Laterality: [name];  . INCISION AND DRAINAGE ABSCESS / HEMATOMA OF BURSA / KNEE / THIGH Left 1998   knee  . KNEE BURSECTOMY Left 1998  . LAPAROSCOPIC CHOLECYSTECTOMY  03/2010  . LEFT HEART CATH AND CORONARY ANGIOGRAPHY [name] 10/05/2017   Procedure: LEFT HEART CATH AND CORONARY ANGIOGRAPHY;  Surgeon: Martinique, Peter M, MD;  Location: [facility] CV LAB;  Service: Cardiovascular;  Laterality: [name];  . NM MYOVIEW LTD  07/22/2013   Normal EF ~60%, no ischemia or infarction.  [name] PARTIAL GASTRECTOMY  1982   subtotal; "took out 30% for ulcers"  . TRANSTHORACIC ECHOCARDIOGRAM  02/16/2014   EF 60%, no RWMA. - otherwise normal  . YAG LASER APPLICATION Bilateral         Home Medications    Prior to Admission medications   Medication Sig Start Date End Date Taking? Authorizing Provider  acetaminophen (TYLENOL) 325 MG tablet Take 325 mg by mouth every 6 (six) hours as needed for mild pain.   Yes [provider]  albuterol (PROVENTIL HFA;VENTOLIN HFA) 108 (90 Base) MCG/ACT inhaler Inhale 2 puffs  into the lungs every 4 (four) hours as needed for wheezing or shortness of breath. 10/30/17  Yes Palumbo, April, MD  aspirin EC 81 MG tablet Take 81 mg by mouth daily.   Yes [provider]  co-enzyme Q-10 30 MG capsule Take 30 mg by mouth daily.    Yes [provider]  diclofenac sodium (VOLTAREN) 1 % GEL Apply 4 g topically 3 (three) times daily as needed (for back pain). 11/10/17  Yes Rory Percy, DO  diltiazem (CARDIZEM CD) 180 MG 24 hr capsule Take 1 capsule (180 mg total) by mouth daily. 12/11/17  Yes Barrett, Evelene Croon, PA-C  HYDROcodone-acetaminophen (NORCO/VICODIN) 5-325 MG tablet Take 1 tablet by mouth every 8 (eight) hours as needed for moderate pain.  12/13/17  Yes [provider]  hydrocortisone cream 1 % Apply to affected area 2 times daily 12/24/17  Yes Maczis, Barth Kirks, PA-C  metFORMIN (GLUCOPHAGE) 1000 MG tablet Take 1,000 mg by mouth 2 (two) times daily with a meal.    Yes [provider]  Multiple Vitamin (MULTIVITAMIN WITH MINERALS) TABS tablet Take 1 tablet by mouth 2 (two) times daily.    Yes [provider]  nitroGLYCERIN (NITROSTAT) 0.4 MG SL tablet Place 1 tablet (0.4 mg total) under the tongue every 5 (five) minutes x 3 doses as needed for chest pain. 11/19/15  Yes Erlene Quan, PA-C  nystatin cream (MYCOSTATIN) Apply to affected area 2 times daily 01/02/18  Yes Deno Etienne, DO  pantoprazole (PROTONIX) 40 MG tablet Take 1 tablet (40 mg total) by mouth 2 (two) times daily before a meal. 11/30/16  Yes Domenic Polite, MD  polyethylene glycol [facility] / GLYCOLAX) packet Take 17 g by mouth as needed for moderate constipation. 11/10/17  Yes Rory Percy, DO  pravastatin (PRAVACHOL) 40 MG tablet Take 1 tablet (40 mg total) by mouth every evening. 05/04/17  Yes Leonie Man, MD  tamsulosin (FLOMAX) 0.4 MG CAPS capsule Take 0.4 mg by mouth daily. 02/20/17  Yes [provider]  glucose blood (PRECISION QID TEST) test strip Check  glucose twice daily 10/24/13   [provider]  lidocaine (LIDODERM) 5 % Place 1 patch onto the skin daily for 30 doses. Remove & Discard patch within 12 hours or as directed by MD 01/11/18 02/10/18  Lennice Sites, DO  Melatonin 3 MG TABS Take 1 tablet (3 mg total) by mouth at bedtime. Patient not taking: Reported on 01/11/2018 11/16/17   Richarda Osmond, DO    Family History Family History  Problem Relation Age of Onset  . Cancer Mother   . Heart attack Father   . Heart attack Brother     Social History Social History   Tobacco Use  . Smoking status: Current Every Day Smoker    Packs/day: 0.50    Years: 57.00    Pack years: 28.50    Types: Cigarettes  . Smokeless tobacco: Never Used  . Tobacco comment: He's been smoking between 0.5 and 2 ppd since age 89.  Substance Use Topics  . Alcohol use: No    Alcohol/week: 0.0 standard drinks    Comment: "quit drinking in 1986"  . Drug use: No     Allergies   Cyclobenzaprine   Review of Systems Review of Systems  Constitutional: Negative for chills and fever.  HENT: Negative for ear pain and sore throat.   Eyes: Negative for pain and visual disturbance.  Respiratory: Negative for cough and shortness of breath.   Cardiovascular: Positive for chest pain. Negative for palpitations.  Gastrointestinal: Negative for abdominal pain and vomiting.  Genitourinary: Negative for dysuria and hematuria.  Musculoskeletal: Negative for arthralgias and back pain.  Skin: Negative for color change and rash.  Neurological: Negative for seizures and syncope.  All other systems reviewed and are negative.    Physical Exam Updated Vital Signs  ED Triage Vitals  Enc Vitals Group     BP 01/11/18 1157 133/74     Pulse Rate 01/11/18 1157 96     Resp 01/11/18 1157 16     Temp 01/11/18 1157 98 F (36.7 C)     Temp Source 01/11/18 1157 Oral     SpO2 01/11/18 1157 98 %     Weight 01/11/18 1108 169 lb 15.6 oz (77.1 kg)     Height  01/11/18 1108 5\' 6"  (1.676 m)     Head Circumference --      Peak Flow --      Pain Score 01/11/18 1108 4     Pain Loc --  Pain Edu? --      Excl. in [place]? --     Physical Exam  Constitutional: He appears well-developed and well-nourished.  HENT:  Head: Normocephalic and atraumatic.  Eyes: Pupils are equal, round, and reactive to light. Conjunctivae and EOM are normal.  Neck: Normal range of motion. Neck supple.  Cardiovascular: Normal rate, regular rhythm, normal heart sounds and intact distal pulses.  No murmur heard. Pulmonary/Chest: Effort normal and breath sounds normal. No respiratory distress.  Abdominal: Soft. Bowel sounds are normal. He exhibits no distension. There is no tenderness.  Musculoskeletal: He exhibits tenderness (TTP over left side of chest wall). He exhibits no edema.  Neurological: He is alert.  Skin: Skin is warm and dry.  Psychiatric: He has a normal mood and affect.  Nursing note and vitals reviewed.    ED Treatments / Results  Labs (all labs ordered are listed, but only abnormal results are displayed) Labs Reviewed  CBC WITH DIFFERENTIAL/PLATELET - Abnormal; Notable for the following components:      Result Value   WBC 3.5 (*)    RBC 3.79 (*)    Hemoglobin 10.7 (*)    HCT 34.1 (*)    RDW 17.6 (*)    Lymphs Abs 0.5 (*)    All other components within normal limits  BASIC METABOLIC PANEL - Abnormal; Notable for the following components:   Glucose, Bld 134 (*)    Calcium 8.7 (*)    All other components within normal limits  I-STAT TROPONIN, ED  CBG MONITORING, ED    EKG EKG Interpretation  Date/Time:  Friday January 11 2018 13:17:07 EDT Ventricular Rate:  92 PR Interval:    QRS Duration: 86 QT Interval:  352 QTC Calculation: 435 R Axis:   -6 Text Interpretation:  Atrial flutter with variable A-V block Septal infarct , age undetermined Abnormal ECG Confirmed by Lennice Sites [phone]) on 01/11/2018 3:33:47 PM   Radiology Dg Chest 2  View  Result Date: 01/11/2018 CLINICAL DATA:  LEFT-sided rib pain and weakness for a month, rapid heart rate, shortness of breath, cough and dizziness beginning last night. EXAM: CHEST - 2 VIEW COMPARISON:  Chest x-ray dated 01/09/2018. Chest x-ray dated 05/21/2017. FINDINGS: Heart size is upper normal, stable. Overall cardiomediastinal silhouette is stable. Chronic bronchitic changes noted centrally. No confluent opacity to suggest a developing pneumonia. No pleural effusion or pneumothorax seen. Chronic vertebral body wedging within the thoracic spine, stable. No acute appearing osseous abnormality. IMPRESSION: 1. No acute findings.  No evidence of pneumonia or pulmonary edema. 2. Chronic bronchitic changes. Electronically Signed   By: Franki Cabot M.D.   On: 01/11/2018 12:42    Procedures Procedures (including critical care time)  Medications Ordered in ED Medications  lidocaine (LIDODERM) 5 % 1 patch (1 patch Transdermal Patch Applied 01/11/18 1459)     Initial Impression / Assessment and Plan / ED Course  I have reviewed the triage vital signs and the nursing notes.  Pertinent labs & imaging results that were available during my care of the patient were reviewed by me and considered in my medical decision making (see chart for details).     [name] is a 77 year old male with history of heart failure, COPD, CAD who presents to the ED with left-sided chest pain.  Patient with normal vitals.  No fever.  Patient with tenderness over the left side of his chest wall.  Has been there for several days to weeks.  Denies any shortness of breath.  No infectious symptoms.  Patient is tender over the left side of his chest wall on exam.  EKG showed atrial flutter that is rate controlled.  Patient with chest x-ray that showed no pneumonia, no pneumothorax, no pleural effusion. No rib fx. Patient with negative troponin.  Unremarkable lab work overall.  No significant anemia, electrolyte  abnormality, kidney injury.  Patient felt improved following lidocaine patch.  Suspect likely musculoskeletal in nature and patient was discharged from ED in good condition.  Recommend Tylenol and Motrin for pain.  Given prescription for lidocaine patch.  This chart was dictated using voice recognition software.  Despite best efforts to proofread,  errors can occur which can change the documentation meaning.   Final Clinical Impressions(s) / ED Diagnoses   Final diagnoses:  Chest wall pain    ED Discharge Orders         Ordered    lidocaine (LIDODERM) 5 %  Every 24 hours     01/11/18 [place], [place], DO 01/11/18 1611

## 2018-01-11 NOTE — ED Notes (Signed)
Patient transported to X-ray 

## 2018-01-11 NOTE — ED Notes (Signed)
Pt. Refused to let this tech assess vital signs.

## 2018-01-11 NOTE — ED Notes (Signed)
Patient verbalizes understanding of discharge instructions. Opportunity for questioning and answers were provided. Armband removed by staff, pt discharged from ED.  

## 2018-01-11 NOTE — ED Notes (Signed)
Patient ambulatory to bathroom with steady gait at this time 

## 2018-01-11 NOTE — ED Triage Notes (Signed)
Pt arrives to ED from home with complaints of left sided rib pain and weakness for a month. EMS reports pt saw his cardiologist checking for referred pain and pt was cleared by cardiology. Pt has not followed up with his PCP because he refuses to wait on hold. Pt placed in position of comfort with bed locked and lowered, call bell in reach.

## 2018-01-14 ENCOUNTER — Emergency Department (HOSPITAL_COMMUNITY): Payer: Medicare Other

## 2018-01-14 ENCOUNTER — Emergency Department (HOSPITAL_COMMUNITY)
Admission: EM | Admit: 2018-01-14 | Discharge: 2018-01-14 | Disposition: A | Discharge status: Home / Self Care | Payer: Medicare Other | Attending: Emergency Medicine | Admitting: Emergency Medicine

## 2018-01-14 ENCOUNTER — Encounter (HOSPITAL_COMMUNITY): Payer: Self-pay | Admitting: Pharmacy Technician

## 2018-01-14 DIAGNOSIS — R42 Dizziness and giddiness: Secondary | ICD-10-CM | POA: Diagnosis not present

## 2018-01-14 DIAGNOSIS — I5032 Chronic diastolic (congestive) heart failure: Secondary | ICD-10-CM | POA: Insufficient documentation

## 2018-01-14 DIAGNOSIS — Z7984 Long term (current) use of oral hypoglycemic drugs: Secondary | ICD-10-CM | POA: Insufficient documentation

## 2018-01-14 DIAGNOSIS — I483 Typical atrial flutter: Secondary | ICD-10-CM | POA: Insufficient documentation

## 2018-01-14 DIAGNOSIS — R002 Palpitations: Secondary | ICD-10-CM | POA: Diagnosis not present

## 2018-01-14 DIAGNOSIS — I2510 Atherosclerotic heart disease of native coronary artery without angina pectoris: Secondary | ICD-10-CM | POA: Diagnosis not present

## 2018-01-14 DIAGNOSIS — I110 Hypertensive heart disease with heart failure: Secondary | ICD-10-CM | POA: Insufficient documentation

## 2018-01-14 DIAGNOSIS — J449 Chronic obstructive pulmonary disease, unspecified: Secondary | ICD-10-CM | POA: Diagnosis not present

## 2018-01-14 DIAGNOSIS — R0902 Hypoxemia: Secondary | ICD-10-CM | POA: Diagnosis not present

## 2018-01-14 DIAGNOSIS — E119 Type 2 diabetes mellitus without complications: Secondary | ICD-10-CM | POA: Diagnosis not present

## 2018-01-14 DIAGNOSIS — Z7982 Long term (current) use of aspirin: Secondary | ICD-10-CM | POA: Diagnosis not present

## 2018-01-14 DIAGNOSIS — Z79899 Other long term (current) drug therapy: Secondary | ICD-10-CM | POA: Insufficient documentation

## 2018-01-14 DIAGNOSIS — I959 Hypotension, unspecified: Secondary | ICD-10-CM | POA: Diagnosis not present

## 2018-01-14 DIAGNOSIS — F17210 Nicotine dependence, cigarettes, uncomplicated: Secondary | ICD-10-CM | POA: Insufficient documentation

## 2018-01-14 DIAGNOSIS — I4891 Unspecified atrial fibrillation: Secondary | ICD-10-CM | POA: Diagnosis not present

## 2018-01-14 DIAGNOSIS — I484 Atypical atrial flutter: Secondary | ICD-10-CM | POA: Diagnosis not present

## 2018-01-14 LAB — CBC
HCT: 35.4 % — ABNORMAL LOW (ref 39.0–52.0)
Hemoglobin: 11 g/dL — ABNORMAL LOW (ref 13.0–17.0)
MCH: 28 pg (ref 26.0–34.0)
MCHC: 31.1 g/dL (ref 30.0–36.0)
MCV: 90.1 fL (ref 80.0–100.0)
Platelets: 341 10*3/uL (ref 150–400)
RBC: 3.93 MIL/uL — AB (ref 4.22–5.81)
RDW: 17.5 % — AB (ref 11.5–15.5)
WBC: 6.3 10*3/uL (ref 4.0–10.5)
nRBC: 0 % (ref 0.0–0.2)

## 2018-01-14 LAB — COMPREHENSIVE METABOLIC PANEL
ALBUMIN: 3.5 g/dL (ref 3.5–5.0)
ALK PHOS: 58 U/L (ref 38–126)
ALT: 43 U/L (ref 0–44)
AST: 34 U/L (ref 15–41)
Anion gap: 14 (ref 5–15)
BILIRUBIN TOTAL: 0.4 mg/dL (ref 0.3–1.2)
BUN: 7 mg/dL — AB (ref 8–23)
CALCIUM: 9.7 mg/dL (ref 8.9–10.3)
CO2: 23 mmol/L (ref 22–32)
Chloride: 101 mmol/L (ref 98–111)
Creatinine, Ser: 1.01 mg/dL (ref 0.61–1.24)
GFR calc Af Amer: >60 mL/min (ref >60)
GLUCOSE: 155 mg/dL — AB (ref 70–99)
POTASSIUM: 4.6 mmol/L (ref 3.5–5.1)
Sodium: 138 mmol/L (ref 135–145)
TOTAL PROTEIN: 6.3 g/dL — AB (ref 6.5–8.1)

## 2018-01-14 LAB — TROPONIN I

## 2018-01-14 LAB — MAGNESIUM: Magnesium: 1.6 mg/dL — ABNORMAL LOW (ref 1.7–2.4)

## 2018-01-14 LAB — PHOSPHORUS: Phosphorus: 4 mg/dL (ref 2.5–4.6)

## 2018-01-14 MED ORDER — DILTIAZEM HCL-DEXTROSE 100-5 MG/100ML-% IV SOLN (PREMIX): 5.0000 mg/h | INTRAVENOUS | Status: DC

## 2018-01-14 MED ORDER — DILTIAZEM LOAD VIA INFUSION: 20.0000 mg | Freq: Once | INTRAVENOUS | Status: DC

## 2018-01-14 MED ORDER — DILTIAZEM LOAD VIA INFUSION: 15.0000 mg | Freq: Once | INTRAVENOUS | Status: DC

## 2018-01-14 MED ORDER — VERAPAMIL HCL 2.5 MG/ML IV SOLN
5.0000 mg | Freq: Once | INTRAVENOUS | Status: AC
  Administered 2018-01-14: 5 mg via INTRAVENOUS
  Filled 2018-01-14: qty 2

## 2018-01-14 NOTE — ED Notes (Signed)
Pt returned from imaging.

## 2018-01-14 NOTE — ED Triage Notes (Signed)
Pt bib ems from home. Pt reports feeling dizzy, diaphoretic at home. 12 lead with afib vs aflutter. 114/53, HR 80's, 97% RA, RR 16, CBG 162. Pt in NAD upon arrival. 20g LFA.

## 2018-01-14 NOTE — ED Notes (Signed)
Per lab, metabolic is on the analyzer, should result soon.

## 2018-01-14 NOTE — ED Notes (Signed)
Pt ambulatory to the restroom without difficulty.

## 2018-01-14 NOTE — ED Notes (Signed)
Patient verbalizes understanding of discharge instructions. Opportunity for questioning and answers were provided. Ambulatory at discharge in NAD.  

## 2018-01-14 NOTE — ED Provider Notes (Signed)
[facility] EMERGENCY DEPARTMENT Provider Note   CSN: [id number] Arrival date & time: 01/14/18  1649     History   Chief Complaint No chief complaint on file.   HPI [name] is a 77 y.o. male.  HPI Patient is a 77 year old male with a history of atrial flutter and atrial fibrillation who presents the emergency department complaints of increasing palpitations at home.  EMS was contacted.  He is found to be in flutter with a rate of approximately 121 on arrival.  By the time I saw and evaluated the patient his heart rate was in the 80s and he was feeling better at that time.  He denies fevers and chills.  He reports recently his Cardizem dose was decreased by his cardiology team.  He thinks this may be affecting his heart rate.  He is in chronic a flutter.  He is not on anticoagulation secondary to GI bleeds.  He states normally he is well rate controlled but today he was having palpitations and he suspects that so it was making him feel poorly.  He is feeling much better at this time.  No fevers and chills.  No productive cough.  Denies abdominal pain.  No back pain.   Past Medical History:  Diagnosis Date  . Anemia    takes Ferrous Sulfate daily  . Arthritis    "all over"  . Balance problem 01/2014  . CAP (community acquired pneumonia) 09/18/2014  . Cervical radiculopathy due to degenerative joint disease of spine   . COPD (chronic obstructive pulmonary disease) ([place])   . Coronary artery disease, non-occlusive    a. 03/2010 Nonocclusive disease by cath, performed for ST elevations on ECG;  b. 06/2013 Lexi MV: EF 60%, no ischemia.  . Diabetes mellitus type II    takes Metformin and Lantus daily  . Diastolic CHF, chronic ([place])    a. 12/2012 EF 55-60%, diast dysfxn, triv MR, mildly dil LA/RA.  [name] History of blood transfusion 1982   "when I had stomach OR"  . History of bronchitis    1998  . History of gastric ulcer   . HTN (hypertension)    takes  Diltiazem daily  . Hyperlipidemia    takes Pravastatin daily  . Joint pain   . PAF (paroxysmal atrial fibrillation) (H[facility])    Recurrent after atrial flutter (a. 07/2010 Status post caval tricuspid isthmus ablation by Dr. Midge Aver Metoprolol daily), currently controlled on flecainide plus diltiazem and Coumadin  . Weakness    numbness and tingling both hands    Patient Active Problem List   Diagnosis Date Noted  . Atrial fibrillation with RVR ([place]) 11/08/2017  . Chest pain with moderate risk of acute coronary syndrome 10/04/2017  . HTN (hypertension) 05/21/2017  . Diabetes mellitus type II 05/21/2017  . Diastolic CHF, chronic ([place]) 05/21/2017  . COPD (chronic obstructive pulmonary disease) ([place]) 05/21/2017  . H/O atrial flutter 05/21/2017  . Coronary disease/nonobstructive 05/21/2017  . Acute blood loss anemia 10/02/2016  . Abdominal pain 10/02/2016  . Constipation 10/02/2016  . Left lower quadrant pain   . Coagulopathy ([place])   . Heme positive stool   . Symptomatic anemia 07/10/2016  . Syncope 05/15/2015  . Cervical disc disease 05/15/2015  . Abnormal urinalysis 05/15/2015  . Anemia 05/15/2015  . History of CVA (cerebrovascular accident) 04/03/2014  . Near syncope 02/15/2014  . Pre-syncope 02/15/2014  . Benign neoplasm of rectum and anal canal 12/02/2013  . Upper GI bleeding 11/30/2013  .  Tobacco use disorder 11/30/2013  . Anticoagulation goal of INR 2 to 3, for PAF - CHA2DS2Vasc = 7 08/15/2013  . Type 2 diabetes mellitus ([place]) 01/22/2013  . Tobacco abuse counseling 12/15/2012  . Hyperlipidemia   . Obesity (BMI 30-39.9) 12/14/2012  . Persistent atrial fibrillation ([place]) 10/07/2010  . Atrial flutter - status post CTI ablation 07/29/2010  . Essential hypertension 07/29/2010  . Chronic diastolic congestive heart failure ([place]) 07/29/2010  . Coronary artery disease, non-occlusive 07/29/2010    Past Surgical History:  Procedure Laterality Date  . ATRIAL ABLATION SURGERY   08/05/10   CTI ablation for atrial flutter by [facility]  . CARDIAC CATHETERIZATION  2012   nl LV function, no occlusive CAD, PAF  . CARDIOVERSION  12/07/2010    Successful direct current cardioversion with atrial fibrillation to normal sinus rhythm  . CARPAL TUNNEL RELEASE Bilateral 01/30/2014   Procedure: BILATERAL CARPAL TUNNEL RELEASE;  Surgeon: Marianna Payment, MD;  Location: [facility];  Service: Orthopedics;  Laterality: Bilateral;  . CATARACT EXTRACTION W/ INTRAOCULAR LENS  IMPLANT, BILATERAL Bilateral   . COLONOSCOPY [name] 12/02/2013   Procedure: COLONOSCOPY;  Surgeon: Irene Shipper, MD;  Location: [facility];  Service: Endoscopy;  Laterality: [name];  . ESOPHAGOGASTRODUODENOSCOPY [name] 09/22/2014   Procedure: ESOPHAGOGASTRODUODENOSCOPY (EGD);  Surgeon: Ronald Lobo, MD;  Location: [facility] ENDOSCOPY;  Service: Endoscopy;  Laterality: [name];  . ESOPHAGOGASTRODUODENOSCOPY [name] 07/11/2016   Procedure: ESOPHAGOGASTRODUODENOSCOPY (EGD);  Surgeon: Doran Stabler, MD;  Location: [facility] ENDOSCOPY;  Service: Endoscopy;  Laterality: [name];  . INCISION AND DRAINAGE ABSCESS / HEMATOMA OF BURSA / KNEE / THIGH Left 1998   knee  . KNEE BURSECTOMY Left 1998  . LAPAROSCOPIC CHOLECYSTECTOMY  03/2010  . LEFT HEART CATH AND CORONARY ANGIOGRAPHY [name] 10/05/2017   Procedure: LEFT HEART CATH AND CORONARY ANGIOGRAPHY;  Surgeon: Martinique, Peter M, MD;  Location: [facility] CV LAB;  Service: Cardiovascular;  Laterality: [name];  . NM MYOVIEW LTD  07/22/2013   Normal EF ~60%, no ischemia or infarction.  [name] PARTIAL GASTRECTOMY  1982   subtotal; "took out 30% for ulcers"  . TRANSTHORACIC ECHOCARDIOGRAM  02/16/2014   EF 60%, no RWMA. - otherwise normal  . YAG LASER APPLICATION Bilateral         Home Medications    Prior to Admission medications   Medication Sig Start Date End Date Taking? Authorizing Provider  acetaminophen (TYLENOL) 325 MG tablet Take 325 mg by mouth every 6 (six) hours as needed for mild pain.   Yes [provider]  albuterol (PROVENTIL HFA;VENTOLIN HFA) 108 (90 Base) MCG/ACT inhaler Inhale 2 puffs into the lungs every 4 (four) hours as needed for wheezing or shortness of breath. 10/30/17  Yes Palumbo, April, MD  aspirin EC 81 MG tablet Take 81 mg by mouth daily.   Yes [provider]  co-enzyme Q-10 30 MG capsule Take 30 mg by mouth daily.    Yes [provider]  diltiazem (CARDIZEM CD) 180 MG 24 hr capsule Take 1 capsule (180 mg total) by mouth daily. 12/11/17  Yes Barrett, Evelene Croon, PA-C  HYDROcodone-acetaminophen (NORCO/VICODIN) 5-325 MG tablet Take 1 tablet by mouth every 8 (eight) hours as needed for moderate pain.  12/13/17  Yes [provider]  hydrocortisone cream 1 % Apply to affected area 2 times daily Patient taking differently: Apply 1 application topically 3 (three) times daily.  12/24/17  Yes Maczis, Barth Kirks, PA-C  lidocaine (LIDODERM) 5 % Place 1 patch onto the  skin daily for 30 doses. Remove & Discard patch within 12 hours or as directed by MD 01/11/18 02/10/18 Yes Curatolo, Adam, DO  Melatonin 3 MG TABS Take 1 tablet (3 mg total) by mouth at bedtime. 11/16/17  Yes Anderson, Chelsey L, DO  metFORMIN (GLUCOPHAGE) 1000 MG tablet Take 1,000 mg by mouth 2 (two) times daily with a meal.    Yes [provider]  Multiple Vitamin (MULTIVITAMIN WITH MINERALS) TABS tablet Take 1 tablet by mouth 2 (two) times daily.    Yes [provider]  nitroGLYCERIN (NITROSTAT) 0.4 MG SL tablet Place 1 tablet (0.4 mg total) under the tongue every 5 (five) minutes x 3 doses as needed for chest pain. 11/19/15  Yes Kilroy, Luke K, PA-C  pantoprazole (PROTONIX) 40 MG tablet Take 1 tablet (40 mg total) by mouth 2 (two) times daily before a meal. 11/30/16  Yes Domenic Polite, MD  polyethylene glycol [facility] / GLYCOLAX) packet Take 17 g by mouth as needed for moderate constipation. 11/10/17  Yes Rory Percy, DO  pravastatin (PRAVACHOL) 40 MG tablet Take 1 tablet (40 mg total) by  mouth every evening. 05/04/17  Yes Leonie Man, MD  tamsulosin (FLOMAX) 0.4 MG CAPS capsule Take 0.4 mg by mouth daily. 02/20/17  Yes [provider]  diclofenac sodium (VOLTAREN) 1 % GEL Apply 4 g topically 3 (three) times daily as needed (for back pain). Patient not taking: Reported on 01/14/2018 11/10/17   Rory Percy, DO  glucose blood (PRECISION QID TEST) test strip Check glucose twice daily 10/24/13   [provider]  nystatin cream (MYCOSTATIN) Apply to affected area 2 times daily 01/02/18   Deno Etienne, DO    Family History Family History  Problem Relation Age of Onset  . Cancer Mother   . Heart attack Father   . Heart attack Brother     Social History Social History   Tobacco Use  . Smoking status: Current Every Day Smoker    Packs/day: 0.50    Years: 57.00    Pack years: 28.50    Types: Cigarettes  . Smokeless tobacco: Never Used  . Tobacco comment: He's been smoking between 0.5 and 2 ppd since age 42.  Substance Use Topics  . Alcohol use: No    Alcohol/week: 0.0 standard drinks    Comment: "quit drinking in 1986"  . Drug use: No     Allergies   Cyclobenzaprine   Review of Systems Review of Systems  All other systems reviewed and are negative.    Physical Exam Updated Vital Signs BP 122/74 (BP Location: Left Arm)   Pulse 76   Temp 98.2 F (36.8 C) (Oral)   Resp 18   Ht 5\' 6"  (1.676 m)   Wt 77.1 kg   SpO2 98%   BMI 27.43 kg/m   Physical Exam  Constitutional: He is oriented to person, place, and time. He appears well-developed and well-nourished.  HENT:  Head: Normocephalic and atraumatic.  Eyes: EOM are normal.  Neck: Normal range of motion.  Cardiovascular: Normal heart sounds and intact distal pulses.  Irregular heart rhythm with controlled rate  Pulmonary/Chest: Effort normal and breath sounds normal. No respiratory distress.  Abdominal: Soft. He exhibits no distension. There is no tenderness.  Musculoskeletal:  Normal range of motion.  Neurological: He is alert and oriented to person, place, and time.  Skin: Skin is warm and dry.  Psychiatric: He has a normal mood and affect. Judgment normal.  Nursing note and  vitals reviewed.    ED Treatments / Results  Labs (all labs ordered are listed, but only abnormal results are displayed) Labs Reviewed  CBC - Abnormal; Notable for the following components:      Result Value   RBC 3.93 (*)    Hemoglobin 11.0 (*)    HCT 35.4 (*)    RDW 17.5 (*)    All other components within normal limits  COMPREHENSIVE METABOLIC PANEL - Abnormal; Notable for the following components:   Glucose, Bld 155 (*)    BUN 7 (*)    Total Protein 6.3 (*)    All other components within normal limits  MAGNESIUM - Abnormal; Notable for the following components:   Magnesium 1.6 (*)    All other components within normal limits  TROPONIN I  PHOSPHORUS    EKG EKG Interpretation  Date/Time:  Monday January 14 2018 18:25:04 EDT Ventricular Rate:  67 PR Interval:    QRS Duration: 83 QT Interval:  409 QTC Calculation: 432 R Axis:   36 Text Interpretation:  Atrial flutter Minimal ST elevation, anterior leads No significant change was found Confirmed by Jola Schmidt [id number]) on 01/14/2018 11:29:37 PM   Radiology Dg Chest 2 View  Result Date: 01/14/2018 CLINICAL DATA:  Atrial fibrillation with palpitation EXAM: CHEST - 2 VIEW COMPARISON:  01/11/2018, 11/12/2017 FINDINGS: The heart size and mediastinal contours are within normal limits. Both lungs are clear. There are degenerative osteophytes of the spine. IMPRESSION: No active cardiopulmonary disease. Electronically Signed   By: Donavan Foil M.D.   On: 01/14/2018 18:16    Procedures Procedures (including critical care time)  Medications Ordered in ED Medications  verapamil (ISOPTIN) injection 5 mg (5 mg Intravenous Given 01/14/18 1731)     Initial Impression / Assessment and Plan / ED Course  I have reviewed the  triage vital signs and the nursing notes.  Pertinent labs & imaging results that were available during my care of the patient were reviewed by me and considered in my medical decision making (see chart for details).     Single dose of IV verapamil given here in the emergency department.  He feels better at this time.  He is remained in rate control.  Currently at time of discharge his heart rate is in the 60s.  He feels much better.  Close primary care and cardiology follow-up.  I will allow the cardiology team to make any changes to his Cardizem.  I recommended he call the office for follow-up and additional recommendations.  Final Clinical Impressions(s) / ED Diagnoses   Final diagnoses:  Typical atrial flutter [facility])    ED Discharge Orders    None       Jola Schmidt, MD 01/14/18 2330

## 2018-01-16 ENCOUNTER — Ambulatory Visit (HOSPITAL_COMMUNITY): Payer: Medicare Other | Admitting: Nurse Practitioner

## 2018-01-16 ENCOUNTER — Emergency Department (HOSPITAL_COMMUNITY): Payer: Medicare Other

## 2018-01-16 ENCOUNTER — Other Ambulatory Visit: Payer: Self-pay

## 2018-01-16 ENCOUNTER — Ambulatory Visit: Payer: Medicare Other

## 2018-01-16 ENCOUNTER — Emergency Department (HOSPITAL_COMMUNITY)
Admission: EM | Admit: 2018-01-16 | Discharge: 2018-01-16 | Disposition: A | Discharge status: Home / Self Care | Payer: Medicare Other | Source: Home / Self Care | Attending: Emergency Medicine | Admitting: Emergency Medicine

## 2018-01-16 ENCOUNTER — Telehealth: Payer: Self-pay | Admitting: Cardiology

## 2018-01-16 DIAGNOSIS — J449 Chronic obstructive pulmonary disease, unspecified: Secondary | ICD-10-CM | POA: Insufficient documentation

## 2018-01-16 DIAGNOSIS — I4811 Longstanding persistent atrial fibrillation: Secondary | ICD-10-CM | POA: Insufficient documentation

## 2018-01-16 DIAGNOSIS — R110 Nausea: Secondary | ICD-10-CM | POA: Diagnosis not present

## 2018-01-16 DIAGNOSIS — R079 Chest pain, unspecified: Secondary | ICD-10-CM | POA: Diagnosis not present

## 2018-01-16 DIAGNOSIS — F17210 Nicotine dependence, cigarettes, uncomplicated: Secondary | ICD-10-CM | POA: Insufficient documentation

## 2018-01-16 DIAGNOSIS — Z7982 Long term (current) use of aspirin: Secondary | ICD-10-CM | POA: Insufficient documentation

## 2018-01-16 DIAGNOSIS — Z79899 Other long term (current) drug therapy: Secondary | ICD-10-CM | POA: Insufficient documentation

## 2018-01-16 DIAGNOSIS — E119 Type 2 diabetes mellitus without complications: Secondary | ICD-10-CM | POA: Insufficient documentation

## 2018-01-16 DIAGNOSIS — R000 Tachycardia, unspecified: Secondary | ICD-10-CM | POA: Diagnosis not present

## 2018-01-16 DIAGNOSIS — I2510 Atherosclerotic heart disease of native coronary artery without angina pectoris: Secondary | ICD-10-CM | POA: Diagnosis not present

## 2018-01-16 DIAGNOSIS — I5032 Chronic diastolic (congestive) heart failure: Secondary | ICD-10-CM | POA: Insufficient documentation

## 2018-01-16 DIAGNOSIS — Z7984 Long term (current) use of oral hypoglycemic drugs: Secondary | ICD-10-CM

## 2018-01-16 DIAGNOSIS — I110 Hypertensive heart disease with heart failure: Secondary | ICD-10-CM

## 2018-01-16 DIAGNOSIS — R0602 Shortness of breath: Secondary | ICD-10-CM | POA: Diagnosis not present

## 2018-01-16 DIAGNOSIS — E785 Hyperlipidemia, unspecified: Secondary | ICD-10-CM | POA: Diagnosis not present

## 2018-01-16 DIAGNOSIS — I4891 Unspecified atrial fibrillation: Secondary | ICD-10-CM | POA: Diagnosis not present

## 2018-01-16 DIAGNOSIS — R0789 Other chest pain: Secondary | ICD-10-CM | POA: Diagnosis not present

## 2018-01-16 DIAGNOSIS — I4819 Other persistent atrial fibrillation: Secondary | ICD-10-CM | POA: Diagnosis not present

## 2018-01-16 DIAGNOSIS — R002 Palpitations: Secondary | ICD-10-CM | POA: Diagnosis not present

## 2018-01-16 LAB — CBC
HCT: 35.5 % — ABNORMAL LOW (ref 39.0–52.0)
Hemoglobin: 11 g/dL — ABNORMAL LOW (ref 13.0–17.0)
MCH: 27.4 pg (ref 26.0–34.0)
MCHC: 31 g/dL (ref 30.0–36.0)
MCV: 88.5 fL (ref 80.0–100.0)
PLATELETS: 359 10*3/uL (ref 150–400)
RBC: 4.01 MIL/uL — ABNORMAL LOW (ref 4.22–5.81)
RDW: 17.3 % — AB (ref 11.5–15.5)
WBC: 7.4 10*3/uL (ref 4.0–10.5)
nRBC: 0 % (ref 0.0–0.2)

## 2018-01-16 LAB — BASIC METABOLIC PANEL
Anion gap: 7 (ref 5–15)
BUN: 9 mg/dL (ref 8–23)
CALCIUM: 9.5 mg/dL (ref 8.9–10.3)
CO2: 25 mmol/L (ref 22–32)
Chloride: 104 mmol/L (ref 98–111)
Creatinine, Ser: 0.98 mg/dL (ref 0.61–1.24)
GFR calc Af Amer: >60 mL/min (ref >60)
GLUCOSE: 120 mg/dL — AB (ref 70–99)
Potassium: 3.9 mmol/L (ref 3.5–5.1)
SODIUM: 136 mmol/L (ref 135–145)

## 2018-01-16 LAB — TROPONIN I

## 2018-01-16 MED ORDER — ASPIRIN 81 MG PO CHEW: 324.0000 mg | CHEWABLE_TABLET | Freq: Once | ORAL | Status: DC

## 2018-01-16 NOTE — ED Provider Notes (Signed)
[facility] EMERGENCY DEPARTMENT Provider Note   CSN: [id number] Arrival date & time: 01/16/18  1351     History   Chief Complaint Chief Complaint  Patient presents with  . Chest Pain    HPI [name] is a 77 y.o. male.  HPI Patient presents to the emergency room for evaluation of palpitations and chest discomfort.  Patient has a history of atrial flutter and atrial fibrillation.  Patient has frequent visits to the ED, 24 visits in the last 6 months.  Patient was last seen in the emergency room on October 21.  Patient states he came to the emergency room today because he had an episode of sharp pain on the left side of his chest that resolved but he also had an episode of tachycardia where his heart rate was up into the 170s.  Patient initially told EMS that his ride did not pick him up to take him to his cardiology appointment today.  However he tells me he started having chest pain in the tachycardia and that is what brought him to the ED.  Denies any fevers or chills.  No vomiting or diarrhea.  Not currently feeling short of breath. Past Medical History:  Diagnosis Date  . Anemia    takes Ferrous Sulfate daily  . Arthritis    "all over"  . Balance problem 01/2014  . CAP (community acquired pneumonia) 09/18/2014  . Cervical radiculopathy due to degenerative joint disease of spine   . COPD (chronic obstructive pulmonary disease) ([place])   . Coronary artery disease, non-occlusive    a. 03/2010 Nonocclusive disease by cath, performed for ST elevations on ECG;  b. 06/2013 Lexi MV: EF 60%, no ischemia.  . Diabetes mellitus type II    takes Metformin and Lantus daily  . Diastolic CHF, chronic ([place])    a. 12/2012 EF 55-60%, diast dysfxn, triv MR, mildly dil LA/RA.  [name] History of blood transfusion 1982   "when I had stomach OR"  . History of bronchitis    1998  . History of gastric ulcer   . HTN (hypertension)    takes Diltiazem daily  . Hyperlipidemia    takes Pravastatin daily  . Joint pain   . PAF (paroxysmal atrial fibrillation) (H[facility])    Recurrent after atrial flutter (a. 07/2010 Status post caval tricuspid isthmus ablation by Dr. Midge Aver Metoprolol daily), currently controlled on flecainide plus diltiazem and Coumadin  . Weakness    numbness and tingling both hands    Patient Active Problem List   Diagnosis Date Noted  . Atrial fibrillation with RVR ([place]) 11/08/2017  . Chest pain with moderate risk of acute coronary syndrome 10/04/2017  . HTN (hypertension) 05/21/2017  . Diabetes mellitus type II 05/21/2017  . Diastolic CHF, chronic ([place]) 05/21/2017  . COPD (chronic obstructive pulmonary disease) ([place]) 05/21/2017  . H/O atrial flutter 05/21/2017  . Coronary disease/nonobstructive 05/21/2017  . Acute blood loss anemia 10/02/2016  . Abdominal pain 10/02/2016  . Constipation 10/02/2016  . Left lower quadrant pain   . Coagulopathy ([place])   . Heme positive stool   . Symptomatic anemia 07/10/2016  . Syncope 05/15/2015  . Cervical disc disease 05/15/2015  . Abnormal urinalysis 05/15/2015  . Anemia 05/15/2015  . History of CVA (cerebrovascular accident) 04/03/2014  . Near syncope 02/15/2014  . Pre-syncope 02/15/2014  . Benign neoplasm of rectum and anal canal 12/02/2013  . Upper GI bleeding 11/30/2013  . Tobacco use disorder 11/30/2013  . Anticoagulation goal  of INR 2 to 3, for PAF - CHA2DS2Vasc = 7 08/15/2013  . Type 2 diabetes mellitus ([place]) 01/22/2013  . Tobacco abuse counseling 12/15/2012  . Hyperlipidemia   . Obesity (BMI 30-39.9) 12/14/2012  . Persistent atrial fibrillation ([place]) 10/07/2010  . Atrial flutter - status post CTI ablation 07/29/2010  . Essential hypertension 07/29/2010  . Chronic diastolic congestive heart failure ([place]) 07/29/2010  . Coronary artery disease, non-occlusive 07/29/2010    Past Surgical History:  Procedure Laterality Date  . ATRIAL ABLATION SURGERY  08/05/10   CTI ablation for atrial  flutter by JA  . CARDIAC CATHETERIZATION  2012   nl LV function, no occlusive CAD, PAF  . CARDIOVERSION  12/07/2010    Successful direct current cardioversion with atrial fibrillation to normal sinus rhythm  . CARPAL TUNNEL RELEASE Bilateral 01/30/2014   Procedure: BILATERAL CARPAL TUNNEL RELEASE;  Surgeon: Marianna Payment, MD;  Location: [facility];  Service: Orthopedics;  Laterality: Bilateral;  . CATARACT EXTRACTION W/ INTRAOCULAR LENS  IMPLANT, BILATERAL Bilateral   . COLONOSCOPY [name] 12/02/2013   Procedure: COLONOSCOPY;  Surgeon: Irene Shipper, MD;  Location: [facility];  Service: Endoscopy;  Laterality: [name];  . ESOPHAGOGASTRODUODENOSCOPY [name] 09/22/2014   Procedure: ESOPHAGOGASTRODUODENOSCOPY (EGD);  Surgeon: Ronald Lobo, MD;  [facility] ENDOSCOPY;  Service: Endoscopy;  Laterality: [name];  . ESOPHAGOGASTRODUODENOSCOPY [name] 07/11/2016   Procedure: ESOPHAGOGASTRODUODENOSCOPY (EGD);  Surgeon: Doran Stabler, MD;  Location: [facility] ENDOSCOPY;  Service: Endoscopy;  Laterality: [name];  . INCISION AND DRAINAGE ABSCESS / HEMATOMA OF BURSA / KNEE / THIGH Left 1998   knee  . KNEE BURSECTOMY Left 1998  . LAPAROSCOPIC CHOLECYSTECTOMY  03/2010  . LEFT HEART CATH AND CORONARY ANGIOGRAPHY [name] 10/05/2017   Procedure: LEFT HEART CATH AND CORONARY ANGIOGRAPHY;  Surgeon: Martinique, Peter M, MD;  Location: [facility] CV LAB;  Service: Cardiovascular;  Laterality: [name];  . NM MYOVIEW LTD  07/22/2013   Normal EF ~60%, no ischemia or infarction.  [name] PARTIAL GASTRECTOMY  1982   subtotal; "took out 30% for ulcers"  . TRANSTHORACIC ECHOCARDIOGRAM  02/16/2014   EF 60%, no RWMA. - otherwise normal  . YAG LASER APPLICATION Bilateral         Home Medications    Prior to Admission medications   Medication Sig Start Date End Date Taking? Authorizing Provider  acetaminophen (TYLENOL) 325 MG tablet Take 325 mg by mouth every 6 (six) hours as needed for mild pain.   Yes [provider]  albuterol (PROVENTIL  HFA;VENTOLIN HFA) 108 (90 Base) MCG/ACT inhaler Inhale 2 puffs into the lungs every 4 (four) hours as needed for wheezing or shortness of breath. 10/30/17  Yes Palumbo, April, MD  aspirin EC 81 MG tablet Take 81 mg by mouth daily.   Yes [provider]  co-enzyme Q-10 30 MG capsule Take 30 mg by mouth daily.    Yes [provider]  diclofenac sodium (VOLTAREN) 1 % GEL Apply 4 g topically 3 (three) times daily as needed (for back pain). 11/10/17  Yes Rory Percy, DO  diltiazem (CARDIZEM CD) 180 MG 24 hr capsule Take 1 capsule (180 mg total) by mouth daily. 12/11/17  Yes Barrett, Evelene Croon, PA-C  HYDROcodone-acetaminophen (NORCO/VICODIN) 5-325 MG tablet Take 1 tablet by mouth every 8 (eight) hours as needed for moderate pain.  12/13/17  Yes [provider]  hydrocortisone cream 1 % Apply to affected area 2 times daily Patient taking differently: Apply 1 application topically 3 (three) times daily.  12/24/17  Yes Maczis, Barth Kirks, PA-C  lidocaine (LIDODERM) 5 % Place 1 patch onto the skin daily for 30 doses. Remove & Discard patch within 12 hours or as directed by MD 01/11/18 02/10/18 Yes Curatolo, Adam, DO  Melatonin 3 MG TABS Take 1 tablet (3 mg total) by mouth at bedtime. 11/16/17  Yes Anderson, Chelsey L, DO  metFORMIN (GLUCOPHAGE) 1000 MG tablet Take 1,000 mg by mouth 2 (two) times daily with a meal.    Yes [provider]  Multiple Vitamin (MULTIVITAMIN WITH MINERALS) TABS tablet Take 1 tablet by mouth 2 (two) times daily.    Yes [provider]  nitroGLYCERIN (NITROSTAT) 0.4 MG SL tablet Place 1 tablet (0.4 mg total) under the tongue every 5 (five) minutes x 3 doses as needed for chest pain. 11/19/15  Yes Kilroy, Luke K, PA-C  nystatin cream (MYCOSTATIN) Apply to affected area 2 times daily Patient taking differently: Apply 1 application topically 2 (two) times daily as needed for dry skin.  01/02/18  Yes Deno Etienne, DO  pantoprazole (PROTONIX) 40 MG tablet  Take 1 tablet (40 mg total) by mouth 2 (two) times daily before a meal. 11/30/16  Yes Domenic Polite, MD  polyethylene glycol [facility] / GLYCOLAX) packet Take 17 g by mouth as needed for moderate constipation. 11/10/17  Yes Rory Percy, DO  pravastatin (PRAVACHOL) 40 MG tablet Take 1 tablet (40 mg total) by mouth every evening. 05/04/17  Yes Leonie Man, MD  tamsulosin (FLOMAX) 0.4 MG CAPS capsule Take 0.4 mg by mouth daily. 02/20/17  Yes [provider]  glucose blood (PRECISION QID TEST) test strip Check glucose twice daily 10/24/13   [provider]    Family History Family History  Problem Relation Age of Onset  . Cancer Mother   . Heart attack Father   . Heart attack Brother     Social History Social History   Tobacco Use  . Smoking status: Current Every Day Smoker    Packs/day: 0.50    Years: 57.00    Pack years: 28.50    Types: Cigarettes  . Smokeless tobacco: Never Used  . Tobacco comment: He's been smoking between 0.5 and 2 ppd since age 65.  Substance Use Topics  . Alcohol use: No    Alcohol/week: 0.0 standard drinks    Comment: "quit drinking in 1986"  . Drug use: No     Allergies   Cyclobenzaprine   Review of Systems Review of Systems  All other systems reviewed and are negative.    Physical Exam Updated Vital Signs BP 125/64 (BP Location: Right Arm)   Pulse 96   Temp 98.2 F (36.8 C) (Oral)   Resp (!) 24   SpO2 99%   Physical Exam  Constitutional: He appears well-developed and well-nourished. No distress.  HENT:  Head: Normocephalic and atraumatic.  Right Ear: External ear normal.  Left Ear: External ear normal.  Eyes: Conjunctivae are normal. Right eye exhibits no discharge. Left eye exhibits no discharge. No scleral icterus.  Neck: Neck supple. No tracheal deviation present.  Cardiovascular: Normal rate and intact distal pulses. An irregularly irregular rhythm present.  Pulmonary/Chest: Effort normal and breath sounds  normal. No stridor. No respiratory distress. He has no wheezes. He has no rales.  Abdominal: Soft. Bowel sounds are normal. He exhibits no distension. There is no tenderness. There is no rebound and no guarding.  Musculoskeletal: He exhibits no edema or tenderness.  Neurological: He is alert. He has  normal strength. No cranial nerve deficit (no facial droop, extraocular movements intact, no slurred speech) or sensory deficit. He exhibits normal muscle tone. He displays no seizure activity. Coordination normal.  Skin: Skin is warm and dry. No rash noted.  Psychiatric: He has a normal mood and affect.  Nursing note and vitals reviewed.    ED Treatments / Results  Labs (all labs ordered are listed, but only abnormal results are displayed) Labs Reviewed  BASIC METABOLIC PANEL - Abnormal; Notable for the following components:      Result Value   Glucose, Bld 120 (*)    All other components within normal limits  CBC - Abnormal; Notable for the following components:   RBC 4.01 (*)    Hemoglobin 11.0 (*)    HCT 35.5 (*)    RDW 17.3 (*)    All other components within normal limits  TROPONIN I    EKG Atrial fibrillation rate 109 Normal axis Normal ST T waves No prior EKG for comparison Radiology Dg Chest 2 View  Result Date: 01/14/2018 CLINICAL DATA:  Atrial fibrillation with palpitation EXAM: CHEST - 2 VIEW COMPARISON:  01/11/2018, 11/12/2017 FINDINGS: The heart size and mediastinal contours are within normal limits. Both lungs are clear. There are degenerative osteophytes of the spine. IMPRESSION: No active cardiopulmonary disease. Electronically Signed   By: Donavan Foil M.D.   On: 01/14/2018 18:16   Dg Chest Portable 1 View  Result Date: 01/16/2018 CLINICAL DATA:  Chest pain. EXAM: PORTABLE CHEST 1 VIEW COMPARISON:  01/14/2018 FINDINGS: Heart size and pulmonary vascularity are normal and the lungs are clear except for a tiny calcified granuloma at the right lung base. No  effusions.  No acute bone abnormality. IMPRESSION: No acute abnormalities. Electronically Signed   By: Lorriane Shire M.D.   On: 01/16/2018 14:42    Procedures Procedures (including critical care time)  Medications Ordered in ED Medications  aspirin chewable tablet 324 mg (324 mg Oral Not Given 01/16/18 1509)     Initial Impression / Assessment and Plan / ED Course  I have reviewed the triage vital signs and the nursing notes.  Pertinent labs & imaging results that were available during my care of the patient were reviewed by me and considered in my medical decision making (see chart for details).  Clinical Course as of Jan 17 1611  Wed Jan 16, 2018  1606 EKG Atrial fribrillation, rate 109 Nl st t waves   [JK]    Clinical Course User Index [JK] Dorie Rank, MD    Patient presented to the emergency room for evaluation of palpitations and tachycardia.  Patient has a known history of atrial fibrillation.  He is followed in the cardiology clinic.  Patient is not currently on anticoagulation because of GI bleeding.  Pt is stable in the ED.  Rate controlled.  No cp in the ED.  Stable for outpatient follow up. Final Clinical Impressions(s) / ED Diagnoses   Final diagnoses:  Longstanding persistent atrial fibrillation    ED Discharge Orders    None       Dorie Rank, MD 01/16/18 [phone]

## 2018-01-16 NOTE — Telephone Encounter (Signed)
Pt reports being out of rhythm since yesterday morning. Reports HR ranging 120s-130s. BP 151/72 this morning. Reports palpations, SOB currently and experienced some dizziness yesterday. Discuss with Marzetta Board, RN in AFib clinic. Pt scheduled to see them this afternoon - 1:45pm. Pt agreeable to plan and thankful for the help.

## 2018-01-16 NOTE — Telephone Encounter (Signed)
New Message   STAT if HR is under 50 or over 120 (normal HR is 60-100 beats per minute)  1) What is your heart rate? 137  2) Do you have a log of your heart rate readings (document readings)? Not available 137, 135  3) Do you have any other symptoms? Shortness of breath earlier this morning

## 2018-01-16 NOTE — ED Notes (Signed)
Pt reports L and mid cp this am which is now resolved.  He is supposed to go to the a-fib clinic today at 1415 but he could not wait.  So, he called 911.  EMS reported initially, the pt asked if they could drop him off at the a-fib clinic.  Pt denies any cp upon arrival to the ED.  He is A&Ox 4.  In NAD.  He states he needs help with cooking food when he is at home.  He states his daughter and son-in-law both work 12 hours a day and are not home to help him.  He is requesting to speak to case mgt.

## 2018-01-16 NOTE — Discharge Instructions (Addendum)
Follow up with your cardiologist as planned, return as needed for worsening symptoms

## 2018-01-16 NOTE — ED Notes (Signed)
SW contacted to get a taxi voucher for pt

## 2018-01-16 NOTE — Progress Notes (Signed)
CSW provided pt with taxi voucher to return home.   Wendelyn Breslow, [facility]  [phone]

## 2018-01-16 NOTE — ED Triage Notes (Signed)
Per EMS, pt from home, lives with his daughter and son-in-law.  Initially, he reported to EMS that his ride did not pick him up to go to his Cards appt today so he called EMS c/o cp.

## 2018-01-18 ENCOUNTER — Inpatient Hospital Stay (HOSPITAL_COMMUNITY)
Admission: EM | Admit: 2018-01-18 | Discharge: 2018-01-20 | DRG: 309 | Disposition: A | Discharge status: Home Health | Payer: Medicare Other | Attending: Internal Medicine | Admitting: Internal Medicine

## 2018-01-18 ENCOUNTER — Other Ambulatory Visit: Payer: Self-pay

## 2018-01-18 ENCOUNTER — Emergency Department (HOSPITAL_COMMUNITY): Payer: Medicare Other

## 2018-01-18 ENCOUNTER — Encounter (HOSPITAL_COMMUNITY): Payer: Self-pay | Admitting: Emergency Medicine

## 2018-01-18 DIAGNOSIS — R52 Pain, unspecified: Secondary | ICD-10-CM | POA: Diagnosis not present

## 2018-01-18 DIAGNOSIS — I5032 Chronic diastolic (congestive) heart failure: Secondary | ICD-10-CM | POA: Diagnosis present

## 2018-01-18 DIAGNOSIS — J449 Chronic obstructive pulmonary disease, unspecified: Secondary | ICD-10-CM | POA: Diagnosis present

## 2018-01-18 DIAGNOSIS — Z7984 Long term (current) use of oral hypoglycemic drugs: Secondary | ICD-10-CM | POA: Diagnosis not present

## 2018-01-18 DIAGNOSIS — I2510 Atherosclerotic heart disease of native coronary artery without angina pectoris: Secondary | ICD-10-CM | POA: Diagnosis present

## 2018-01-18 DIAGNOSIS — I110 Hypertensive heart disease with heart failure: Secondary | ICD-10-CM | POA: Diagnosis present

## 2018-01-18 DIAGNOSIS — R002 Palpitations: Secondary | ICD-10-CM | POA: Diagnosis not present

## 2018-01-18 DIAGNOSIS — I10 Essential (primary) hypertension: Secondary | ICD-10-CM | POA: Diagnosis present

## 2018-01-18 DIAGNOSIS — E119 Type 2 diabetes mellitus without complications: Secondary | ICD-10-CM | POA: Diagnosis not present

## 2018-01-18 DIAGNOSIS — Z7982 Long term (current) use of aspirin: Secondary | ICD-10-CM | POA: Diagnosis not present

## 2018-01-18 DIAGNOSIS — E785 Hyperlipidemia, unspecified: Secondary | ICD-10-CM | POA: Diagnosis present

## 2018-01-18 DIAGNOSIS — R110 Nausea: Secondary | ICD-10-CM | POA: Diagnosis not present

## 2018-01-18 DIAGNOSIS — I4819 Other persistent atrial fibrillation: Principal | ICD-10-CM | POA: Diagnosis present

## 2018-01-18 DIAGNOSIS — E1151 Type 2 diabetes mellitus with diabetic peripheral angiopathy without gangrene: Secondary | ICD-10-CM | POA: Diagnosis present

## 2018-01-18 DIAGNOSIS — E782 Mixed hyperlipidemia: Secondary | ICD-10-CM | POA: Diagnosis present

## 2018-01-18 DIAGNOSIS — Z8249 Family history of ischemic heart disease and other diseases of the circulatory system: Secondary | ICD-10-CM | POA: Diagnosis not present

## 2018-01-18 DIAGNOSIS — Z888 Allergy status to other drugs, medicaments and biological substances status: Secondary | ICD-10-CM

## 2018-01-18 DIAGNOSIS — R000 Tachycardia, unspecified: Secondary | ICD-10-CM | POA: Diagnosis not present

## 2018-01-18 DIAGNOSIS — D509 Iron deficiency anemia, unspecified: Secondary | ICD-10-CM | POA: Diagnosis present

## 2018-01-18 DIAGNOSIS — R079 Chest pain, unspecified: Secondary | ICD-10-CM | POA: Diagnosis not present

## 2018-01-18 DIAGNOSIS — I499 Cardiac arrhythmia, unspecified: Secondary | ICD-10-CM | POA: Diagnosis not present

## 2018-01-18 DIAGNOSIS — Z8711 Personal history of peptic ulcer disease: Secondary | ICD-10-CM | POA: Diagnosis not present

## 2018-01-18 DIAGNOSIS — F17210 Nicotine dependence, cigarettes, uncomplicated: Secondary | ICD-10-CM | POA: Diagnosis present

## 2018-01-18 DIAGNOSIS — Z79899 Other long term (current) drug therapy: Secondary | ICD-10-CM | POA: Diagnosis not present

## 2018-01-18 DIAGNOSIS — I959 Hypotension, unspecified: Secondary | ICD-10-CM | POA: Diagnosis not present

## 2018-01-18 DIAGNOSIS — I25118 Atherosclerotic heart disease of native coronary artery with other forms of angina pectoris: Secondary | ICD-10-CM | POA: Diagnosis present

## 2018-01-18 DIAGNOSIS — I4891 Unspecified atrial fibrillation: Secondary | ICD-10-CM | POA: Diagnosis not present

## 2018-01-18 DIAGNOSIS — D649 Anemia, unspecified: Secondary | ICD-10-CM | POA: Diagnosis present

## 2018-01-18 DIAGNOSIS — R0602 Shortness of breath: Secondary | ICD-10-CM | POA: Diagnosis not present

## 2018-01-18 LAB — BASIC METABOLIC PANEL
Anion gap: 10 (ref 5–15)
BUN: 14 mg/dL (ref 8–23)
CO2: 21 mmol/L — ABNORMAL LOW (ref 22–32)
Calcium: 8.5 mg/dL — ABNORMAL LOW (ref 8.9–10.3)
Chloride: 106 mmol/L (ref 98–111)
Creatinine, Ser: 1.1 mg/dL (ref 0.61–1.24)
GFR calc Af Amer: >60 mL/min (ref >60)
GFR calc non Af Amer: >60 mL/min (ref >60)
Glucose, Bld: 133 mg/dL — ABNORMAL HIGH (ref 70–99)
Potassium: 3.8 mmol/L (ref 3.5–5.1)
Sodium: 137 mmol/L (ref 135–145)

## 2018-01-18 LAB — CBC
HCT: 34.5 % — ABNORMAL LOW (ref 39.0–52.0)
Hemoglobin: 10.5 g/dL — ABNORMAL LOW (ref 13.0–17.0)
MCH: 27.2 pg (ref 26.0–34.0)
MCHC: 30.4 g/dL (ref 30.0–36.0)
MCV: 89.4 fL (ref 80.0–100.0)
Platelets: 323 10*3/uL (ref 150–400)
RBC: 3.86 MIL/uL — ABNORMAL LOW (ref 4.22–5.81)
RDW: 17.3 % — ABNORMAL HIGH (ref 11.5–15.5)
WBC: 12.1 10*3/uL — ABNORMAL HIGH (ref 4.0–10.5)
nRBC: 0 % (ref 0.0–0.2)

## 2018-01-18 LAB — I-STAT TROPONIN, ED: Troponin i, poc: 0 ng/mL (ref 0.00–0.08)

## 2018-01-18 LAB — GLUCOSE, CAPILLARY: GLUCOSE-CAPILLARY: 143 mg/dL — AB (ref 70–99)

## 2018-01-18 MED ORDER — MELATONIN 3 MG PO TABS
3.0000 mg | ORAL_TABLET | Freq: Every day | ORAL | Status: DC
  Administered 2018-01-18 – 2018-01-19 (×2): 3 mg via ORAL
  Filled 2018-01-18 (×3): qty 1

## 2018-01-18 MED ORDER — NITROGLYCERIN 0.4 MG SL SUBL: 0.4000 mg | SUBLINGUAL_TABLET | SUBLINGUAL | Status: DC | PRN

## 2018-01-18 MED ORDER — COENZYME Q10 30 MG PO CAPS: 30.0000 mg | ORAL_CAPSULE | Freq: Every day | ORAL | Status: DC

## 2018-01-18 MED ORDER — CALCIUM GLUCONATE-NACL 1-0.675 GM/50ML-% IV SOLN
1.0000 g | Freq: Once | INTRAVENOUS | Status: AC
  Administered 2018-01-18: 1000 mg via INTRAVENOUS
  Filled 2018-01-18: qty 50

## 2018-01-18 MED ORDER — HYDROCODONE-ACETAMINOPHEN 5-325 MG PO TABS: 1.0000 | ORAL_TABLET | Freq: Three times a day (TID) | ORAL | Status: DC | PRN

## 2018-01-18 MED ORDER — INSULIN ASPART 100 UNIT/ML ~~LOC~~ SOLN
0.0000 [IU] | Freq: Three times a day (TID) | SUBCUTANEOUS | Status: DC
  Administered 2018-01-19 (×2): 1 [IU] via SUBCUTANEOUS
  Administered 2018-01-19 – 2018-01-20 (×2): 2 [IU] via SUBCUTANEOUS

## 2018-01-18 MED ORDER — PANTOPRAZOLE SODIUM 40 MG PO TBEC
40.0000 mg | DELAYED_RELEASE_TABLET | Freq: Two times a day (BID) | ORAL | Status: DC
  Administered 2018-01-19 – 2018-01-20 (×3): 40 mg via ORAL
  Filled 2018-01-18 (×3): qty 1

## 2018-01-18 MED ORDER — POLYETHYLENE GLYCOL 3350 17 G PO PACK: 17.0000 g | PACK | ORAL | Status: DC | PRN

## 2018-01-18 MED ORDER — VERAPAMIL HCL 2.5 MG/ML IV SOLN: 5.0000 mg | Freq: Once | INTRAVENOUS | Status: DC

## 2018-01-18 MED ORDER — SODIUM CHLORIDE 0.9 % IV SOLN: 1.0000 g | Freq: Once | INTRAVENOUS | Status: DC

## 2018-01-18 MED ORDER — ADULT MULTIVITAMIN W/MINERALS CH
1.0000 | ORAL_TABLET | Freq: Two times a day (BID) | ORAL | Status: DC
  Administered 2018-01-19 – 2018-01-20 (×3): 1 via ORAL
  Filled 2018-01-18 (×3): qty 1

## 2018-01-18 MED ORDER — ONDANSETRON HCL 4 MG/2ML IJ SOLN: 4.0000 mg | Freq: Four times a day (QID) | INTRAMUSCULAR | Status: DC | PRN

## 2018-01-18 MED ORDER — DILTIAZEM HCL-DEXTROSE 100-5 MG/100ML-% IV SOLN (PREMIX)
5.0000 mg/h | Freq: Once | INTRAVENOUS | Status: DC
  Filled 2018-01-18: qty 100

## 2018-01-18 MED ORDER — HYDROCORTISONE 1 % EX CREA
TOPICAL_CREAM | Freq: Two times a day (BID) | CUTANEOUS | Status: DC
  Administered 2018-01-18 – 2018-01-20 (×4): via TOPICAL
  Filled 2018-01-18 (×2): qty 28

## 2018-01-18 MED ORDER — PRAVASTATIN SODIUM 40 MG PO TABS
40.0000 mg | ORAL_TABLET | Freq: Every evening | ORAL | Status: DC
  Administered 2018-01-19: 40 mg via ORAL
  Filled 2018-01-18: qty 1

## 2018-01-18 MED ORDER — ACETAMINOPHEN 325 MG PO TABS: 650.0000 mg | ORAL_TABLET | ORAL | Status: DC | PRN

## 2018-01-18 MED ORDER — SODIUM CHLORIDE 0.9 % IV BOLUS
500.0000 mL | Freq: Once | INTRAVENOUS | Status: AC
  Administered 2018-01-18: 500 mL via INTRAVENOUS

## 2018-01-18 MED ORDER — ASPIRIN EC 81 MG PO TBEC
81.0000 mg | DELAYED_RELEASE_TABLET | Freq: Every day | ORAL | Status: DC
  Administered 2018-01-19 – 2018-01-20 (×2): 81 mg via ORAL
  Filled 2018-01-18 (×2): qty 1

## 2018-01-18 MED ORDER — TAMSULOSIN HCL 0.4 MG PO CAPS
0.4000 mg | ORAL_CAPSULE | Freq: Every day | ORAL | Status: DC
  Administered 2018-01-19 – 2018-01-20 (×2): 0.4 mg via ORAL
  Filled 2018-01-18 (×2): qty 1

## 2018-01-18 MED ORDER — ALBUTEROL SULFATE (2.5 MG/3ML) 0.083% IN NEBU: 2.5000 mg | INHALATION_SOLUTION | RESPIRATORY_TRACT | Status: DC | PRN

## 2018-01-18 NOTE — ED Provider Notes (Signed)
77 year old male received at sign out from [place] pending cardiology consult and likely admission. Per her HPI:   "HPI [name] is a 77 y.o. male with history of COPD, CAD, diabetes mellitus, hypertension, hyperlipidemia, persistent A. fib/a flutter presents for evaluation of acute onset, constant left-sided chest pain and generalized weakness beginning at around 12:30 AM this morning.  He states that he awoke feeling short of breath with left-sided chest pressure.  Also notes intermittent nausea but denies any vomiting or abdominal pain.  States that his heart rate was high on the EMS monitor.  He is not currently anti-anticoagulated due to GI bleed.  States he has been compliant with his home medications.  States he smokes around 4 cigarettes daily.  Also notes lower thoracic/upper lumbar back pain which has been ongoing for several weeks.  Denies any injury.  No radiation into the lower extremities.  He denies bowel or bladder incontinence, saddle anesthesia, fevers, or IV drug use.  He states that he is in the process of possibly being set up for a pacemaker and states "I am tired of coming in here all the time.  I want the pacemaker ".  The history is provided by the patient."   Physical Exam  BP (!) 90/53   Pulse 97   Temp 97.8 F (36.6 C) (Oral)   Resp 16   Ht 5\' 6"  (1.676 m)   Wt 75.8 kg   SpO2 97%   BMI 26.95 kg/m   Physical Exam  Uncomfortable appearing. Speaks in complete, fluent sentences.   ED Course/Procedures     Procedures  MDM   77 year old male received at sign out from [place] pending Cardiology consult. She spoke with Dr. Marlou Porch who recommended a 500 cc bolus and a cardizem drip after receiving calcium gluconate. The patient was seen by cardiology who will plan to admit. The patient appears reasonably stabilized for admission considering the current resources, flow, and capabilities available in the ED at this time, and I doubt any other [facility] requiring  further screening and/or treatment in the ED prior to admission.       Joanne Gavel, PA-C 01/18/18 2217    Nat Christen, MD 01/19/18 [phone]

## 2018-01-18 NOTE — ED Notes (Signed)
Attempted report x1. 

## 2018-01-18 NOTE — ED Triage Notes (Signed)
Pt arrives via EMS from home, called out for medication question and not feeling well. Pt was taking cephalexin and wasn't sure why. Pt vitals noted pt in atrial flutter rhythm, having back pain and generalized weakness. Pt alert, oriented x3, unsure of date/time with me. Moves all extremities. NAD at present. 20g L FA, 271ml NS. cbg 200, 108/59.

## 2018-01-18 NOTE — ED Provider Notes (Signed)
[facility] EMERGENCY DEPARTMENT Provider Note   CSN: [id number] Arrival date & time: 01/18/18  1241     History   Chief Complaint Chief Complaint  Patient presents with  . Atrial Flutter    HPI [name] is a 77 y.o. male with history of COPD, CAD, diabetes mellitus, hypertension, hyperlipidemia, persistent A. fib/a flutter presents for evaluation of acute onset, constant left-sided chest pain and generalized weakness beginning at around 12:30 AM this morning.  He states that he awoke feeling short of breath with left-sided chest pressure.  Also notes intermittent nausea but denies any vomiting or abdominal pain.  States that his heart rate was high on the EMS monitor.  He is not currently anti-anticoagulated due to GI bleed.  States he has been compliant with his home medications.  States he smokes around 4 cigarettes daily.  Also notes lower thoracic/upper lumbar back pain which has been ongoing for several weeks.  Denies any injury.  No radiation into the lower extremities.  He denies bowel or bladder incontinence, saddle anesthesia, fevers, or IV drug use.  He states that he is in the process of possibly being set up for a pacemaker and states "I am tired of coming in here all the time.  I want the pacemaker ".  The history is provided by the patient.    Past Medical History:  Diagnosis Date  . Anemia    takes Ferrous Sulfate daily  . Arthritis    "all over"  . Balance problem 01/2014  . CAP (community acquired pneumonia) 09/18/2014  . Cervical radiculopathy due to degenerative joint disease of spine   . COPD (chronic obstructive pulmonary disease) ([place])   . Coronary artery disease, non-occlusive    a. 03/2010 Nonocclusive disease by cath, performed for ST elevations on ECG;  b. 06/2013 Lexi MV: EF 60%, no ischemia.  . Diabetes mellitus type II    takes Metformin and Lantus daily  . Diastolic CHF, chronic ([place])    a. 12/2012 EF 55-60%, diast dysfxn,  triv MR, mildly dil LA/RA.  [name] History of blood transfusion 1982   "when I had stomach OR"  . History of bronchitis    1998  . History of gastric ulcer   . HTN (hypertension)    takes Diltiazem daily  . Hyperlipidemia    takes Pravastatin daily  . Joint pain   . PAF (paroxysmal atrial fibrillation) (HCC)    Recurrent after atrial flutter (a. 07/2010 Status post caval tricuspid isthmus ablation by Dr. Midge Aver Metoprolol daily), currently controlled on flecainide plus diltiazem and Coumadin  . Weakness    numbness and tingling both hands    Patient Active Problem List   Diagnosis Date Noted  . Atrial fibrillation with RVR ([place]) 11/08/2017  . Chest pain with moderate risk of acute coronary syndrome 10/04/2017  . HTN (hypertension) 05/21/2017  . Diabetes mellitus type II 05/21/2017  . Diastolic CHF, chronic ([place]) 05/21/2017  . COPD (chronic obstructive pulmonary disease) ([place]) 05/21/2017  . H/O atrial flutter 05/21/2017  . Coronary disease/nonobstructive 05/21/2017  . Acute blood loss anemia 10/02/2016  . Abdominal pain 10/02/2016  . Constipation 10/02/2016  . Left lower quadrant pain   . Coagulopathy ([place])   . Heme positive stool   . Symptomatic anemia 07/10/2016  . Syncope 05/15/2015  . Cervical disc disease 05/15/2015  . Abnormal urinalysis 05/15/2015  . Anemia 05/15/2015  . History of CVA (cerebrovascular accident) 04/03/2014  . Near syncope 02/15/2014  .  Pre-syncope 02/15/2014  . Benign neoplasm of rectum and anal canal 12/02/2013  . Upper GI bleeding 11/30/2013  . Tobacco use disorder 11/30/2013  . Anticoagulation goal of INR 2 to 3, for PAF - CHA2DS2Vasc = 7 08/15/2013  . Type 2 diabetes mellitus ([place]) 01/22/2013  . Tobacco abuse counseling 12/15/2012  . Hyperlipidemia   . Obesity (BMI 30-39.9) 12/14/2012  . Persistent atrial fibrillation ([place]) 10/07/2010  . Atrial flutter - status post CTI ablation 07/29/2010  . Essential hypertension 07/29/2010  . Chronic  diastolic congestive heart failure ([place]) 07/29/2010  . Coronary artery disease, non-occlusive 07/29/2010    Past Surgical History:  Procedure Laterality Date  . ATRIAL ABLATION SURGERY  08/05/10   CTI ablation for atrial flutter by JA  . CARDIAC CATHETERIZATION  2012   nl LV function, no occlusive CAD, PAF  . CARDIOVERSION  12/07/2010    Successful direct current cardioversion with atrial fibrillation to normal sinus rhythm  . CARPAL TUNNEL RELEASE Bilateral 01/30/2014   Procedure: BILATERAL CARPAL TUNNEL RELEASE;  Surgeon: Marianna Payment, MD;  Location: [facility];  Service: Orthopedics;  Laterality: Bilateral;  . CATARACT EXTRACTION W/ INTRAOCULAR LENS  IMPLANT, BILATERAL Bilateral   . COLONOSCOPY [name] 12/02/2013   Procedure: COLONOSCOPY;  Surgeon: Irene Shipper, MD;  Location: [facility];  Service: Endoscopy;  Laterality: [name];  . ESOPHAGOGASTRODUODENOSCOPY [name] 09/22/2014   Procedure: ESOPHAGOGASTRODUODENOSCOPY (EGD);  Surgeon: Ronald Lobo, MD;  Location: [facility] ENDOSCOPY;  Service: Endoscopy;  Laterality: [name];  . ESOPHAGOGASTRODUODENOSCOPY [name] 07/11/2016   Procedure: ESOPHAGOGASTRODUODENOSCOPY (EGD);  Surgeon: Doran Stabler, MD;  Location: [facility] ENDOSCOPY;  Service: Endoscopy;  Laterality: [name];  . INCISION AND DRAINAGE ABSCESS / HEMATOMA OF BURSA / KNEE / THIGH Left 1998   knee  . KNEE BURSECTOMY Left 1998  . LAPAROSCOPIC CHOLECYSTECTOMY  03/2010  . LEFT HEART CATH AND CORONARY ANGIOGRAPHY [name] 10/05/2017   Procedure: LEFT HEART CATH AND CORONARY ANGIOGRAPHY;  Surgeon: Martinique, Peter M, MD;  Location: [facility] CV [facility];  Service: Cardiovascular;  Laterality: [name];  . NM MYOVIEW LTD  07/22/2013   Normal EF ~60%, no ischemia or infarction.  [name] PARTIAL GASTRECTOMY  1982   subtotal; "took out 30% for ulcers"  . TRANSTHORACIC ECHOCARDIOGRAM  02/16/2014   EF 60%, no RWMA. - otherwise normal  . YAG LASER APPLICATION Bilateral         Home Medications    Prior to Admission medications     Medication Sig Start Date End Date Taking? Authorizing Provider  acetaminophen (TYLENOL) 325 MG tablet Take 325 mg by mouth every 6 (six) hours as needed for mild pain.   Yes [provider]  albuterol (PROVENTIL HFA;VENTOLIN HFA) 108 (90 Base) MCG/ACT inhaler Inhale 2 puffs into the lungs every 4 (four) hours as needed for wheezing or shortness of breath. 10/30/17  Yes Palumbo, April, MD  aspirin EC 81 MG tablet Take 81 mg by mouth daily.   Yes [provider]  co-enzyme Q-10 30 MG capsule Take 30 mg by mouth daily.    Yes [provider]  diclofenac sodium (VOLTAREN) 1 % GEL Apply 4 g topically 3 (three) times daily as needed (for back pain). 11/10/17  Yes Rory Percy, DO  diltiazem (CARDIZEM CD) 180 MG 24 hr capsule Take 1 capsule (180 mg total) by mouth daily. 12/11/17  Yes Barrett, Evelene Croon, PA-C  HYDROcodone-acetaminophen (NORCO/VICODIN) 5-325 MG tablet Take 1 tablet by mouth every 8 (eight) hours as needed for moderate pain.  12/13/17  Yes [provider]  hydrocortisone cream 1 % Apply to affected area 2 times daily Patient taking differently: Apply 1 application topically 3 (three) times daily.  12/24/17  Yes Maczis, Barth Kirks, PA-C  lidocaine (LIDODERM) 5 % Place 1 patch onto the skin daily for 30 doses. Remove & Discard patch within 12 hours or as directed by MD 01/11/18 02/10/18 Yes Curatolo, Adam, DO  Melatonin 3 MG TABS Take 1 tablet (3 mg total) by mouth at bedtime. 11/16/17  Yes Anderson, Chelsey L, DO  metFORMIN (GLUCOPHAGE) 1000 MG tablet Take 1,000 mg by mouth 2 (two) times daily with a meal.    Yes [provider]  Multiple Vitamin (MULTIVITAMIN WITH MINERALS) TABS tablet Take 1 tablet by mouth 2 (two) times daily.    Yes [provider]  nitroGLYCERIN (NITROSTAT) 0.4 MG SL tablet Place 1 tablet (0.4 mg total) under the tongue every 5 (five) minutes x 3 doses as needed for chest pain. 11/19/15  Yes Kilroy, Luke K, PA-C  nystatin  cream (MYCOSTATIN) Apply to affected area 2 times daily Patient taking differently: Apply 1 application topically 2 (two) times daily as needed for dry skin.  01/02/18  Yes Deno Etienne, DO  pantoprazole (PROTONIX) 40 MG tablet Take 1 tablet (40 mg total) by mouth 2 (two) times daily before a meal. 11/30/16  Yes Domenic Polite, MD  polyethylene glycol [facility] / GLYCOLAX) packet Take 17 g by mouth as needed for moderate constipation. 11/10/17  Yes Rory Percy, DO  pravastatin (PRAVACHOL) 40 MG tablet Take 1 tablet (40 mg total) by mouth every evening. 05/04/17  Yes Leonie Man, MD  tamsulosin (FLOMAX) 0.4 MG CAPS capsule Take 0.4 mg by mouth daily. 02/20/17  Yes [provider]  glucose blood (PRECISION QID TEST) test strip Check glucose twice daily 10/24/13   [provider]    Family History Family History  Problem Relation Age of Onset  . Cancer Mother   . Heart attack Father   . Heart attack Brother     Social History Social History   Tobacco Use  . Smoking status: Current Every Day Smoker    Packs/day: 0.50    Years: 57.00    Pack years: 28.50    Types: Cigarettes  . Smokeless tobacco: Never Used  . Tobacco comment: He's been smoking between 0.5 and 2 ppd since age 53.  Substance Use Topics  . Alcohol use: No    Alcohol/week: 0.0 standard drinks    Comment: "quit drinking in 1986"  . Drug use: No     Allergies   Cyclobenzaprine   Review of Systems Review of Systems  Constitutional: Positive for fatigue. Negative for chills and fever.  Respiratory: Positive for shortness of breath.   Cardiovascular: Positive for chest pain and palpitations.  Gastrointestinal: Positive for nausea. Negative for abdominal pain and vomiting.  Musculoskeletal: Positive for back pain.  All other systems reviewed and are negative.    Physical Exam Updated Vital Signs BP (!) 88/49   Pulse 96   Temp 99.1 F (37.3 C) (Oral)   Resp (!) 22   Ht 5\' 6"  (1.676 m)    Wt 75.3 kg   SpO2 99%   BMI 26.79 kg/m   Physical Exam  Constitutional: He appears well-developed and well-nourished. No distress.  HENT:  Head: Normocephalic and atraumatic.  Eyes: Conjunctivae are normal. Right eye exhibits no discharge. Left eye exhibits no discharge.  Neck: Normal range of motion. No  JVD present. No tracheal deviation present.  Cardiovascular:  Tachycardic, irregularly irregular rhythm. 2+ radial and DP/PT pulses bilaterally, Homans sign absent bilaterally, no lower extremity edema, no palpable cords, compartments are soft   Pulmonary/Chest: Effort normal and breath sounds normal. He exhibits tenderness.  Equal rise and fall of chest, no increased work of breathing.  Tenderness to palpation of the left anterior chest wall with no deformity, crepitus, ecchymosis, or flail segment.  Abdominal: Soft. Bowel sounds are normal. He exhibits no distension. There is no tenderness. There is no guarding.  Musculoskeletal: He exhibits no edema.  5/5 strength of BLE major muscle groups.  There is tenderness to palpation at around the level of T12/L1 with bilateral paraspinal muscle tenderness.  No deformity, crepitus, or step-off noted.  Neurological: He is alert.  Sensation intact to soft touch of bilateral lower extremities.  Skin: Skin is warm and dry. No erythema.  Psychiatric: He has a normal mood and affect. His behavior is normal.  Nursing note and vitals reviewed.    ED Treatments / Results  Labs (all labs ordered are listed, but only abnormal results are displayed) Labs Reviewed  BASIC METABOLIC PANEL - Abnormal; Notable for the following components:      Result Value   CO2 21 (*)    Glucose, Bld 133 (*)    Calcium 8.5 (*)    All other components within normal limits  CBC - Abnormal; Notable for the following components:   WBC 12.1 (*)    RBC 3.86 (*)    Hemoglobin 10.5 (*)    HCT 34.5 (*)    RDW 17.3 (*)    All other components within normal limits    I-STAT TROPONIN, ED    EKG ED ECG REPORT   Date: 01/18/2018  Rate: 134   Rhythm: A fib with RVR  QRS Axis: normal  Intervals: unable to assess  ST/T Wave abnormalities: normal  Conduction Disutrbances:A-fib w RVR  Narrative Interpretation:   Old EKG Reviewed: changes noted   I have personally reviewed the EKG tracing and agree with the computerized printout as noted.   Radiology Dg Chest 2 View  Result Date: 01/18/2018 CLINICAL DATA:  Chest pain, atrial flutter, back pain, generalized weakness, history of COPD, coronary artery disease, paroxysmal atrial fibrillation, hypertension, ulcer disease, CHF, smoker EXAM: CHEST - 2 VIEW COMPARISON:  01/16/2018 FINDINGS: Upper normal heart size. Atherosclerotic calcification aorta. Mediastinal contours and pulmonary vascularity normal. Lungs clear. No acute infiltrate, pleural effusion or pneumothorax. Tiny calcified granuloma at lower RIGHT chest. Diffuse osseous demineralization. IMPRESSION: No acute abnormalities. Electronically Signed   By: Lavonia Dana M.D.   On: 01/18/2018 13:55    Procedures .Critical Care Performed by: Renita Papa, PA-C Authorized by: Renita Papa, PA-C   Critical care provider statement:    Critical care time (minutes):  45   Critical care was necessary to treat or prevent imminent or life-threatening deterioration of the following conditions:  Cardiac failure   Critical care was time spent personally by me on the following activities:  Discussions with consultants, evaluation of patient's response to treatment, examination of patient, ordering and performing treatments and interventions, ordering and review of laboratory studies, ordering and review of radiographic studies, pulse oximetry, re-evaluation of patient's condition, obtaining history from patient or surrogate and review of old charts   I assumed direction of critical care for this patient from another provider in my specialty: no     (including  critical care time)  Medications Ordered in ED Medications  diltiazem (CARDIZEM) 100 mg in dextrose 5% 158mL (1 mg/mL) infusion (has no administration in time range)  sodium chloride 0.9 % bolus 500 mL (500 mLs Intravenous New Bag/Given 01/18/18 1610)  calcium gluconate 1 g/ 50 mL sodium chloride IVPB (1,000 mg Intravenous New Bag/Given 01/18/18 1611)     Initial Impression / Assessment and Plan / ED Course  I have reviewed the triage vital signs and the nursing notes.  Pertinent labs & imaging results that were available during my care of the patient were reviewed by me and considered in my medical decision making (see chart for details).     Patient presents for evaluation of chest pain, shortness of breath, and fatigue.  Found to be in A. fib with RVR on EKG.  He is tachycardic and hypotensive in the ED.  Chest x-ray shows no acute cardiopulmonary abnormalities. Troponin negative, no metabolic derangements.  He does have a new leukocytosis as compared to 2 days ago; H&H is stable.  Spoke with Dr. Marlou Porch from Cardiology who recommends diltiazem drip and fluid bolus.  We will also give calcium IV prior to administering diltiazem drip.  4:33 PM Signed out to oncoming provider PA McDonald.  Electrophysiology team to see and evaluate patient.  Awaiting cardiology recommendations.  Anticipate admission.  Patient seen and evaluated by Dr. Reather Converse who agrees with assessment and plan at this time.  Final Clinical Impressions(s) / ED Diagnoses   Final diagnoses:  Atrial fibrillation with RVR [facility])    ED Discharge Orders    None       Renita Papa, PA-C 01/18/18 1634    Elnora Morrison, MD 01/18/18 1717

## 2018-01-18 NOTE — Progress Notes (Signed)
PHARMACIST - PHYSICIAN ORDER COMMUNICATION  CONCERNING: P&T Medication Policy on Herbal Medications  DESCRIPTION:  This patient's order for: Co-enzyme Q10  has been noted.  This product(s) is classified as an "herbal" or natural product. Due to a lack of definitive safety studies or FDA approval, nonstandard manufacturing practices, plus the potential risk of unknown drug-drug interactions while on inpatient medications, the Pharmacy and Therapeutics Committee does not permit the use of "herbal" or natural products of this type within [facility].   ACTION TAKEN: The pharmacy department is unable to verify this order at this time.  Please reevaluate patient's clinical condition at discharge and address if the herbal or natural product(s) should be resumed at that time.   Nicole Cella, RPh Clinical Pharmacist 01/18/2018 8:25 PM

## 2018-01-18 NOTE — H&P (Addendum)
H&P   Patient ID: [name] MRN: [id number]; DOB: 11-15-1940  Admit date: 01/18/2018 Date of Consult: 01/18/2018  Primary Care Provider: [name], MD Primary Cardiologist: Glenetta Hew, MD  Primary Electrophysiologist:  Faolan [place] Meredith Leeds, MD    Patient Profile:   [name] is a 77 y.o. male with a hx of COPD, GIB, chronic CHF (diastolic), arthritis, DM, HTN, HLD, PAFib/flutter (atypical), CAD,  who is being seen today for the evaluation of AFib w/RVR at the request of Dr. Marlou Porch.  History of Present Illness:   [name] has had numerous ER visits this month, 9 including today 12/24/17 facial burning after shaving 12/27/17 shoulder pain (3 weeks) 01/02/18 rash  01/08/18 rash balanitis 01/09/18 weakness, HR 94bpm, 112/63 01/11/18 chest wall pain, 92bpm 01/14/18 AFlutter, HR 121 >> 80s >.67  w/IV verapamil, pt reported his home diilt was reduced by an MD, I do not see notes to this affect 01/16/18 CP, sharp, palpitations, missed office visit 2/2 no ride, 96bpm, 125/64  He comes with c/o palpitations, SOB, CP, weakness EKG is AFib, 134bpm BP soft, 90's SBP  Currently HR 100-110, 110/67BP LABS K+ 3.8 BUN/Creat 14/1.10 poc Trop 0.00 WBC 12.1 H/H 10/34 Plts 323  He last saw Dr. Curt Bears 12/14/17, mentioning a number of ER visist as well  And hx of ablation, not anticoagulated, 2/2 hx of GIB, was on dilt 180mg  daily, his amioadrone stopped given no planns for rhythm control, not anticoagulated.  There was discussion on possible need for PPM (and presumably AV node ablation) if rates continued to be a problem.  Difficult to nail down specific complaints, tends to go off into multiple topics, a paper he got from his insurance company for example.  Palpitations are "driving him crazy", and worrisome to him, has chronic sounding low back pain.  No CP, no SOB, no near syncope or syncope  Past Medical History:  Diagnosis Date  . Anemia    takes Ferrous Sulfate  daily  . Arthritis    "all over"  . Balance problem 01/2014  . CAP (community acquired pneumonia) 09/18/2014  . Cervical radiculopathy due to degenerative joint disease of spine   . COPD (chronic obstructive pulmonary disease) ([place])   . Coronary artery disease, non-occlusive    a. 03/2010 Nonocclusive disease by cath, performed for ST elevations on ECG;  b. 06/2013 [facility] MV: EF 60%, no ischemia.  . Diabetes mellitus type II    takes Metformin and Lantus daily  . Diastolic CHF, chronic ([place])    a. 12/2012 EF 55-60%, diast dysfxn, triv MR, mildly dil LA/RA.  [name] History of blood transfusion 1982   "when I had stomach OR"  . History of bronchitis    1998  . History of gastric ulcer   . HTN (hypertension)    takes Diltiazem daily  . Hyperlipidemia    takes Pravastatin daily  . Joint pain   . PAF (paroxysmal atrial fibrillation) ([facility])    Recurrent after atrial flutter (a. 07/2010 Status post caval tricuspid isthmus ablation by Dr. Midge Aver Metoprolol daily), currently controlled on flecainide plus diltiazem and Coumadin  . Weakness    numbness and tingling both hands    Past Surgical History:  Procedure Laterality Date  . ATRIAL ABLATION SURGERY  08/05/10   CTI ablation for atrial flutter by JA  . CARDIAC CATHETERIZATION  2012   nl LV function, no occlusive CAD, PAF  . CARDIOVERSION  12/07/2010    Successful direct current cardioversion with atrial  fibrillation to normal sinus rhythm  . CARPAL TUNNEL RELEASE Bilateral 01/30/2014   Procedure: BILATERAL CARPAL TUNNEL RELEASE;  Surgeon: Marianna Payment, MD;  Location: [facility];  Service: Orthopedics;  Laterality: Bilateral;  . CATARACT EXTRACTION W/ INTRAOCULAR LENS  IMPLANT, BILATERAL Bilateral   . COLONOSCOPY [name] 12/02/2013   Procedure: COLONOSCOPY;  Surgeon: Irene Shipper, MD;  Location: [facility];  Service: Endoscopy;  Laterality: [name];  . ESOPHAGOGASTRODUODENOSCOPY [name] 09/22/2014   Procedure: ESOPHAGOGASTRODUODENOSCOPY (EGD);   Surgeon: Ronald Lobo, MD;  Location: [facility] ENDOSCOPY;  Service: Endoscopy;  Laterality: [name];  . ESOPHAGOGASTRODUODENOSCOPY [name] 07/11/2016   Procedure: ESOPHAGOGASTRODUODENOSCOPY (EGD);  Surgeon: Doran Stabler, MD;  Location: [facility] ENDOSCOPY;  Service: Endoscopy;  Laterality: [name];  . INCISION AND DRAINAGE ABSCESS / HEMATOMA OF BURSA / KNEE / THIGH Left 1998   knee  . KNEE BURSECTOMY Left 1998  . LAPAROSCOPIC CHOLECYSTECTOMY  03/2010  . LEFT HEART CATH AND CORONARY ANGIOGRAPHY [name] 10/05/2017   Procedure: LEFT HEART CATH AND CORONARY ANGIOGRAPHY;  Surgeon: Martinique, Peter M, MD;  Location: [facility];  Service: Cardiovascular;  Laterality: [name];  . NM MYOVIEW LTD  07/22/2013   Normal EF ~60%, no ischemia or infarction.  [name] PARTIAL GASTRECTOMY  1982   subtotal; "took out 30% for ulcers"  . TRANSTHORACIC ECHOCARDIOGRAM  02/16/2014   EF 60%, no RWMA. - otherwise normal  . YAG LASER APPLICATION Bilateral      Home Medications:  Prior to Admission medications   Medication Sig Start Date End Date Taking? Authorizing Provider  acetaminophen (TYLENOL) 325 MG tablet Take 325 mg by mouth every 6 (six) hours as needed for mild pain.   Yes [provider]  albuterol (PROVENTIL HFA;VENTOLIN HFA) 108 (90 Base) MCG/ACT inhaler Inhale 2 puffs into the lungs every 4 (four) hours as needed for wheezing or shortness of breath. 10/30/17  Yes Palumbo, April, MD  aspirin EC 81 MG tablet Take 81 mg by mouth daily.   Yes [provider]  co-enzyme Q-10 30 MG capsule Take 30 mg by mouth daily.    Yes [provider]  diclofenac sodium (VOLTAREN) 1 % GEL Apply 4 g topically 3 (three) times daily as needed (for back pain). 11/10/17  Yes Rory Percy, DO  diltiazem (CARDIZEM CD) 180 MG 24 hr capsule Take 1 capsule (180 mg total) by mouth daily. 12/11/17  Yes Barrett, Evelene Croon, PA-C  HYDROcodone-acetaminophen (NORCO/VICODIN) 5-325 MG tablet Take 1 tablet by mouth every 8 (eight) hours as needed  for moderate pain.  12/13/17  Yes [provider]  hydrocortisone cream 1 % Apply to affected area 2 times daily Patient taking differently: Apply 1 application topically 3 (three) times daily.  12/24/17  Yes Maczis, Barth Kirks, PA-C  lidocaine (LIDODERM) 5 % Place 1 patch onto the skin daily for 30 doses. Remove & Discard patch within 12 hours or as directed by MD 01/11/18 02/10/18 Yes Curatolo, Adam, DO  Melatonin 3 MG TABS Take 1 tablet (3 mg total) by mouth at bedtime. 11/16/17  Yes Anderson, Chelsey L, DO  metFORMIN (GLUCOPHAGE) 1000 MG tablet Take 1,000 mg by mouth 2 (two) times daily with a meal.    Yes [provider]  Multiple Vitamin (MULTIVITAMIN WITH MINERALS) TABS tablet Take 1 tablet by mouth 2 (two) times daily.    Yes [provider]  nitroGLYCERIN (NITROSTAT) 0.4 MG SL tablet Place 1 tablet (0.4 mg total) under the tongue every 5 (five) minutes x 3  doses as needed for chest pain. 11/19/15  Yes Kilroy, Luke K, PA-C  nystatin cream (MYCOSTATIN) Apply to affected area 2 times daily Patient taking differently: Apply 1 application topically 2 (two) times daily as needed for dry skin.  01/02/18  Yes Deno Etienne, DO  pantoprazole (PROTONIX) 40 MG tablet Take 1 tablet (40 mg total) by mouth 2 (two) times daily before a meal. 11/30/16  Yes Domenic Polite, MD  polyethylene glycol [facility] / GLYCOLAX) packet Take 17 g by mouth as needed for moderate constipation. 11/10/17  Yes Rory Percy, DO  pravastatin (PRAVACHOL) 40 MG tablet Take 1 tablet (40 mg total) by mouth every evening. 05/04/17  Yes Leonie Man, MD  tamsulosin (FLOMAX) 0.4 MG CAPS capsule Take 0.4 mg by mouth daily. 02/20/17  Yes [provider]  glucose blood (PRECISION QID TEST) test strip Check glucose twice daily 10/24/13   [provider]    Inpatient Medications: Scheduled Meds:  Continuous Infusions: . diltiazem (CARDIZEM) infusion    . sodium chloride     PRN  Meds:   Allergies:    Allergies  Allergen Reactions  . Cyclobenzaprine Other (See Comments)    Unsteady gait    Social History:   Social History   Socioeconomic History  . Marital status: Widowed    Spouse name: Not on file  . Number of children: Not on file  . Years of education: Not on file  . Highest education level: Not on file  Occupational History  . Occupation: Retired  Scientific laboratory technician  . Financial resource strain: Not on file  . Food insecurity:    Worry: Not on file    Inability: Not on file  . Transportation needs:    Medical: Not on file    Non-medical: Not on file  Tobacco Use  . Smoking status: Current Every Day Smoker    Packs/day: 0.50    Years: 57.00    Pack years: 28.50    Types: Cigarettes  . Smokeless tobacco: Never Used  . Tobacco comment: He's been smoking between 0.5 and 2 ppd since age 76.  Substance and Sexual Activity  . Alcohol use: No    Alcohol/week: 0.0 [name] drinks    Comment: "quit drinking in 1986"  . Drug use: No  . Sexual activity: Never  Lifestyle  . Physical activity:    Days per week: Not on file    Minutes per session: Not on file  . Stress: Not on file  Relationships  . Social connections:    Talks on phone: Not on file    Gets together: Not on file    Attends religious service: Not on file    Active member of club or organization: Not on file    Attends meetings of clubs or organizations: Not on file    Relationship status: Not on file  . Intimate partner violence:    Fear of current or ex partner: Not on file    Emotionally abused: Not on file    Physically abused: Not on file    Forced sexual activity: Not on file  Other Topics Concern  . Not on file  Social History Narrative   He is a widower, who moved to [place] in 2011 to live closer to his daughter. He formerly lived in [place]. He is a father of 33, grandfather, and great-grandfather of 3. Does not drink alcohol, context of smoker.    Family  History:   Family History  Problem  Relation Age of Onset  . Cancer Mother   . Heart attack Father   . Heart attack Brother      ROS:  Please see the history of present illness.  All other ROS reviewed and negative.     Physical Exam/Data:   Vitals:   01/18/18 1430 01/18/18 1445 01/18/18 1515 01/18/18 1530  BP: (!) 105/49 (!) 88/43 (!) 92/44 (!) 88/49  Pulse: (!) 108 (!) 118 (!) 119 96  Resp: 20 19 19  (!) 22  Temp:      TempSrc:      SpO2: 97% 96% 100% 99%  Weight:      Height:       No intake or output data in the 24 hours ending 01/18/18 1544 Filed Weights   01/18/18 1256  Weight: 75.3 kg   Body mass index is 26.79 kg/m.  General:  Well nourished, well developed, somewhat disheveld appearance,  in no acute distress HEENT: normal Lymph: no adenopathy Neck: no JVD Endocrine:  No thryomegaly Vascular: No carotid bruits Cardiac:  irreg-irreg, tachycardic; no murmurs, gallops or rubs Lungs:  CTA b/l, no wheezing, rhonchi or rales  Abd: soft, nontender  Ext: no edema Musculoskeletal:  No deformities, age appropriate atrophy Skin: warm and dry  Neuro:  no focal abnormalities noted Psych:  Normal affect   EKG:  The EKG was personally reviewed and demonstrates:   AFib 134bpm Telemetry:  Telemetry was personally reviewed and demonstrates:   AFib 100-110's  Relevant CV Studies:  10/05/17: TTE Study Conclusions - Left ventricle: The cavity size was normal. Wall thickness was   increased in a pattern of mild LVH. Systolic function was normal.   The estimated ejection fraction was in the range of 55% to 60%. - Aortic valve: There was trivial regurgitation. - Mitral valve: There was mild regurgitation. - Left atrium: The atrium was mildly dilated   10/05/17: LHC  Dist RCA lesion is 40% stenosed.  Ost 1st Sept lesion is 95% stenosed.  LV end diastolic pressure is normal.   1. Single vessel CAD involving the ostium of the first septal perforator. Otherwise  nonobstructive disease. 2. Normal LVEDP  Plan: medical management. No obstructive disease in the RCA. Septal perforator is too small for PCI.  Laboratory Data:  Chemistry Recent Labs  Lab 01/14/18 0705 01/16/18 1441 01/18/18 1414  NA 138 136 137  K 4.6 3.9 3.8  CL 101 104 106  CO2 23 25 21*  GLUCOSE 155* 120* 133*  BUN 7* 9 14  CREATININE 1.01 0.98 1.10  CALCIUM 9.7 9.5 8.5*  GFRNONAA >60 >60 >60  GFRAA >60 >60 >60  ANIONGAP 14 7 10     Recent Labs  Lab 01/14/18 0705  PROT 6.3*  ALBUMIN 3.5  AST 34  ALT 43  ALKPHOS 58  BILITOT 0.4   Hematology Recent Labs  Lab 01/14/18 0705 01/16/18 1441 01/18/18 1414  WBC 6.3 7.4 12.1*  RBC 3.93* 4.01* 3.86*  HGB 11.0* 11.0* 10.5*  HCT 35.4* 35.5* 34.5*  MCV 90.1 88.5 89.4  MCH 28.0 27.4 27.2  MCHC 31.1 31.0 30.4  RDW 17.5* 17.3* 17.3*  PLT 341 359 323   Cardiac Enzymes Recent Labs  Lab 01/14/18 0705 01/16/18 1441  TROPONINI <0.03 <0.03    Recent Labs  Lab 01/18/18 1425  TROPIPOC 0.00    BNPNo results for input(s): BNP, PROBNP in the last 168 hours.  DDimer No results for input(s): DDIMER in the last 168 hours.  Radiology/Studies:  Dg Chest 2 View Result Date: 01/18/2018 CLINICAL DATA:  Chest pain, atrial flutter, back pain, generalized weakness, history of COPD, coronary artery disease, paroxysmal atrial fibrillation, hypertension, ulcer disease, CHF, smoker EXAM: CHEST - 2 VIEW COMPARISON:  01/16/2018 FINDINGS: Upper normal heart size. Atherosclerotic calcification aorta. Mediastinal contours and pulmonary vascularity normal. Lungs clear. No acute infiltrate, pleural effusion or pneumothorax. Tiny calcified granuloma at lower RIGHT chest. Diffuse osseous demineralization. IMPRESSION: No acute abnormalities. Electronically Signed   By: Lavonia Dana M.D.   On: 01/18/2018 13:55    Assessment and Plan:   1. Afib w/RVR     Pt reports medicine compliance     Unable to anticoagulate by record 2/2 GIB     BP is  better, 110/67 [name] start dilt gtt without bolus  Try to transition back to po at 240mg  tomorrow if able, if on PO unable to gain rate control [name] need to visit pace/ablate  2. CAD     Medically managed with small vessel disease     Continue home meds  3. DM     Hold off on glucophage     SSI while here, possible need for PPM  4. Chronic CHF (diastolic)     No exam findings to suggest fluid OL      For questions or updates, please contact [name] Please consult [email or web address] for contact info under     Signed, Trevor Jamaica, PA-C  01/18/2018 3:44 PM  I have seen and examined this patient with [name].  Agree with above, note added to reflect my findings.  On exam, iRRR, no murmurs, lungs clear. Patient presented to the hospital with rapid atrial fibrillation. He feels palpitations and weakness. He has been tried on amiodarone but rhythm control has not been possible. [name] plan for admission for diltiazem titration. If diltiazem does not control his HR, would benefit from pacemaker and AVN ablation.     [name] M. Trevor Zerby MD 01/18/2018 5:11 PM

## 2018-01-18 NOTE — ED Notes (Signed)
Attempted report x 2 

## 2018-01-19 LAB — IRON AND TIBC
Iron: 17 ug/dL — ABNORMAL LOW (ref 45–182)
SATURATION RATIOS: 5 % — AB (ref 17.9–39.5)
TIBC: 336 ug/dL (ref 250–450)
UIBC: 319 ug/dL

## 2018-01-19 LAB — GLUCOSE, CAPILLARY
GLUCOSE-CAPILLARY: 87 mg/dL (ref 70–99)
GLUCOSE-CAPILLARY: 144 mg/dL — AB (ref 70–99)
GLUCOSE-CAPILLARY: 140 mg/dL — AB (ref 70–99)
GLUCOSE-CAPILLARY: 178 mg/dL — AB (ref 70–99)
Glucose-Capillary: 178 mg/dL — ABNORMAL HIGH (ref 70–99)

## 2018-01-19 LAB — FERRITIN: FERRITIN: 15 ng/mL — AB (ref 24–336)

## 2018-01-19 MED ORDER — DILTIAZEM HCL ER COATED BEADS 240 MG PO CP24
240.0000 mg | ORAL_CAPSULE | Freq: Every day | ORAL | Status: DC
  Administered 2018-01-19 – 2018-01-20 (×2): 240 mg via ORAL
  Filled 2018-01-19 (×2): qty 1

## 2018-01-19 MED ORDER — DICLOFENAC SODIUM 1 % TD GEL
2.0000 g | Freq: Four times a day (QID) | TRANSDERMAL | Status: DC
  Administered 2018-01-19 – 2018-01-20 (×4): 2 g via TOPICAL
  Filled 2018-01-19: qty 100

## 2018-01-19 NOTE — Social Work (Signed)
CSW acknowledging consult for SNF placement/assistance with ADLs and IADLs.  Will follow for therapy recommendations. Should pt be recommended for [facility] please consult RN Case Management.    Alexander Mt, [place] Work [phone]

## 2018-01-19 NOTE — Progress Notes (Signed)
Progress Note  Patient Name: [name] Date of Encounter: 01/19/2018  Primary Cardiologist: Glenetta Hew, MD   Subjective   " my iron level is down"  Inpatient Medications    Scheduled Meds: . aspirin EC  81 mg Oral Daily  . hydrocortisone cream   Topical BID  . insulin aspart  0-9 Units Subcutaneous TID WC  . Melatonin  3 mg Oral QHS  . multivitamin with minerals  1 tablet Oral BID  . pantoprazole  40 mg Oral BID AC  . pravastatin  40 mg Oral QPM  . tamsulosin  0.4 mg Oral Daily   Continuous Infusions: . diltiazem (CARDIZEM) infusion Stopped (01/18/18 1546)   PRN Meds: acetaminophen, albuterol, HYDROcodone-acetaminophen, nitroGLYCERIN, ondansetron (ZOFRAN) IV, polyethylene glycol   Vital Signs    Vitals:   01/18/18 2009 01/18/18 2020 01/19/18 0314 01/19/18 0640  BP: (!) 73/44 (!) 90/53 (!) 89/57 (!) 98/56  Pulse: 89 97 75   Resp:   18   Temp: 97.9 F (36.6 C) 97.8 F (36.6 C) 97.9 F (36.6 C)   TempSrc: Oral Oral Oral   SpO2:  97% 97%   Weight:   76.1 kg   Height:        Intake/Output Summary (Last 24 hours) at 01/19/2018 1021 Last data filed at 01/19/2018 1019 Gross per 24 hour  Intake 1030 ml  Output -  Net 1030 ml   Filed Weights   01/18/18 1256 01/18/18 1840 01/19/18 0314  Weight: 75.3 kg 75.8 kg 76.1 kg    Telemetry    Atrial fib with a controlled VR - Personally Reviewed  ECG    none - Personally Reviewed  Physical Exam   GEN: No acute distress.   Neck: 6 cm JVD Cardiac: IRIRR, no murmurs, rubs, or gallops.  Respiratory: Clear to auscultation bilaterally. GI: Soft, nontender, non-distended  MS: No edema; No deformity. Neuro:  Nonfocal  Psych: Normal affect   Labs    Chemistry Recent Labs  Lab 01/14/18 0705 01/16/18 1441 01/18/18 1414  NA 138 136 137  K 4.6 3.9 3.8  CL 101 104 106  CO2 23 25 21*  GLUCOSE 155* 120* 133*  BUN 7* 9 14  CREATININE 1.01 0.98 1.10  CALCIUM 9.7 9.5 8.5*  PROT 6.3*  --   --     ALBUMIN 3.5  --   --   AST 34  --   --   ALT 43  --   --   ALKPHOS 58  --   --   BILITOT 0.4  --   --   GFRNONAA >60 >60 >60  GFRAA >60 >60 >60  ANIONGAP 14 7 10      Hematology Recent Labs  Lab 01/14/18 0705 01/16/18 1441 01/18/18 1414  WBC 6.3 7.4 12.1*  RBC 3.93* 4.01* 3.86*  HGB 11.0* 11.0* 10.5*  HCT 35.4* 35.5* 34.5*  MCV 90.1 88.5 89.4  MCH 28.0 27.4 27.2  MCHC 31.1 31.0 30.4  RDW 17.5* 17.3* 17.3*  PLT 341 359 323    Cardiac Enzymes Recent Labs  Lab 01/14/18 0705 01/16/18 1441  TROPONINI <0.03 <0.03    Recent Labs  Lab 01/18/18 1425  TROPIPOC 0.00     BNPNo results for input(s): BNP, PROBNP in the last 168 hours.   DDimer No results for input(s): DDIMER in the last 168 hours.   Radiology    Dg Chest 2 View  Result Date: 01/18/2018 CLINICAL DATA:  Chest pain, atrial flutter, back pain,  generalized weakness, history of COPD, coronary artery disease, paroxysmal atrial fibrillation, hypertension, ulcer disease, CHF, smoker EXAM: CHEST - 2 VIEW COMPARISON:  01/16/2018 FINDINGS: Upper normal heart size. Atherosclerotic calcification aorta. Mediastinal contours and pulmonary vascularity normal. Lungs clear. No acute infiltrate, pleural effusion or pneumothorax. Tiny calcified granuloma at lower RIGHT chest. Diffuse osseous demineralization. IMPRESSION: No acute abnormalities. Electronically Signed   By: Lavonia Dana M.D.   On: 01/18/2018 13:55    Cardiac Studies   none  Patient Profile     77 y.o. male admitted with uncontrolled atrial fib  Assessment & Plan    1. Atrial fib with a RVR - his VR is now well controlled. He will continue his cardizem po. 2. Anemia - his hgb is unchanged but we will check his iron level and ferritin.  3. Disposition - anticipate discharge home tomorrow.   For questions or updates, please contact [place] Please consult [email or web address] for contact info under Cardiology/STEMI.      Signed, Cristopher Peru, MD   01/19/2018, 10:21 AM  Patient ID: [name], male   DOB: 01-11-1941, 77 y.o.   MRN: [id number]

## 2018-01-20 LAB — GLUCOSE, CAPILLARY: Glucose-Capillary: 169 mg/dL — ABNORMAL HIGH (ref 70–99)

## 2018-01-20 MED ORDER — FERROUS SULFATE 325 (65 FE) MG PO TABS: 325.0000 mg | ORAL_TABLET | Freq: Every day | ORAL | 2 refills | Status: DC

## 2018-01-20 MED ORDER — DILTIAZEM HCL ER COATED BEADS 240 MG PO CP24: 240.0000 mg | ORAL_CAPSULE | Freq: Every day | ORAL | 2 refills | Status: DC

## 2018-01-20 MED ORDER — VITAMIN C 250 MG PO TABS: 250.0000 mg | ORAL_TABLET | Freq: Every day | ORAL | 2 refills | Status: DC

## 2018-01-20 NOTE — Care Management Note (Signed)
Case Management Note  Patient Details  Name: [name] MRN: [id number] Date of Birth: 04-01-1940  Subjective/Objective:         Pt presented for afib.  Pt has used [facility] in the past and would like to use again for [facility] PT.           Action/Plan: Referral called to [facility] with Alvis Lemmings.    Expected Discharge Date:  01/20/18               Expected Discharge Plan:  [place]  In-House Referral:  NA  Discharge planning Services  NA  Post Acute Care Choice:  Home Health Choice offered to:  Patient  DME Arranged:  [name] DME Agency:  NA  HH Arranged:  PT HH Agency:  [facility]  Status of Service:  Completed, signed off  If discussed at [place] of Stay Meetings, dates discussed:    Additional Comments:  Claudie Leach, RN 01/20/2018, 1:35 PM

## 2018-01-20 NOTE — Discharge Summary (Addendum)
Discharge Summary    Patient ID: [name]  MRN: [id number], DOB/AGE: 05/30/1940 77 y.o.  Admit Date: 01/18/2018 Discharge Date: 01/20/2018  Primary Care Provider: [name], MD Primary Cardiologist: Dr. [name], MD Primary Electrophysiologist: Dr. Curt Bears, MD  Discharge Diagnoses    Principal Problem:   Rapid atrial fibrillation [facility]) Active Problems:   Essential hypertension   Chronic diastolic congestive heart failure [facility])   Coronary artery disease, non-occlusive   Persistent atrial fibrillation (HCC)   Hyperlipidemia   Anemia   Allergies Allergies  Allergen Reactions  . Cyclobenzaprine Other (See Comments)    Unsteady gait     History of Present Illness     77 year old male with history of nonobstructive CAD by LHC in 09/2017, persistent Afib/flutter s/p ablation for both, HFpEF, iron deficiency anemia with prior GI bleed, COPD with ongoing tobacco abuse, DM2, HTN, and HLD who was admitted to [facility] with Afib with RVR.   Most recent echo in 09/2017 showed an EF of 55-60%, mild LVH, trivial AI, mild MR, mildly dilated LA. LHC in 09/2017 showed first septal branch 95% stenosed, distal RCA 40% stenosis, LVEDP normal. The first septal branch was too small for PCI. Medical management was advised. He has not been on [facility] given his history of GI bleed. He was seen in the ED 8/11, 8/14/8/19, 9/3 for Afib. Seen in the office 12/11/17 and noted to be in coarse Afib vs atypical atrial flutter with variable AV block, rate controlled. At that time, he was on amiodarone and Cardizem. He was seen by EP on 9/20 and remained in atrial flutter. Given he was not anticoagulated and was feeling well while in atrial flutter/fib his amiodarone was held at that time. Plans were to discuss [place] with GI. There was also concern for possible PPM given his somewhat difficult to control rates. Since that visit, he has been seen in the ED on 10/16 for weakness, 10/18 for chest pain, 10/21  with atrial flutter/fib with RVR, 10/23 for chest pain and Afib/flutter with RVR.    Hospital Course     Consultants: pharmacy, social work   He returned to the ED on 10/25 with palpitations, chest pain and SOB. He was noted to be in Afib with RVR with ventricular rates into the 140s bpm with BP being soft in the 47S systolic. Initial labs in the ED showed a potassium of 3.8, BUN/SCR 14/1.1, WBC 12.1, troponin negative, HGB 10, PLT 323. He reported compliance with medications. He was started on a diltiazem gtt without bolus for rate control with improvement in ventricular rates. He was transitioned to PO Cardizem 240 mg on 10/26 with ventricular rates being improved and being bradycardic into the 40s to 50s bpm with the longest R-R interval being 2.5 seconds. He will be discharged on increased dose of Cardizem 240 gm daily per MD. BP remained soft in the 90s to low 962E systolic, asymptomatic. HGB remained low, though stable. Ferritin 15, iron 17 during admission. He has been started on PO iron along with vitamin C at discharge per MD. He will need follow up with his PCP for his anemia. He has ambulated without issues. If his ventricular rates continue to be difficult to control, he may benefit from AV nodal ablation and PPM implantation in follow up.   The patient has been seen by Dr. Lovena Le and felt to be stable for discharge today. All follow up appointments have been made. Discharge medications are listed below. Prescriptions  have been reviewed with the patient and sent in to their pharmacy.  _____________  Discharge Vitals Blood pressure 100/63, pulse 87, temperature 97.6 F (36.4 C), temperature source Oral, resp. rate 18, height 5\' 6"  (1.676 m), weight 76.1 kg, SpO2 94 %.  Filed Weights   01/18/18 1840 01/19/18 0314 01/20/18 0700  Weight: 75.8 kg 76.1 kg 76.1 kg    Labs & Radiologic Studies    CBC Recent Labs    01/18/18 1414  WBC 12.1*  HGB 10.5*  HCT 34.5*  MCV 89.4  PLT 193    Basic Metabolic Panel Recent Labs    01/18/18 1414  NA 137  K 3.8  CL 106  CO2 21*  GLUCOSE 133*  BUN 14  CREATININE 1.10  CALCIUM 8.5*   Liver Function Tests No results for input(s): AST, ALT, ALKPHOS, BILITOT, PROT, ALBUMIN in the last 72 hours. No results for input(s): LIPASE, AMYLASE in the last 72 hours. Cardiac Enzymes No results for input(s): CKTOTAL, CKMB, CKMBINDEX, TROPONINI in the last 72 hours. BNP Invalid input(s): POCBNP D-Dimer No results for input(s): DDIMER in the last 72 hours. Hemoglobin A1C No results for input(s): HGBA1C in the last 72 hours. Fasting Lipid Panel No results for input(s): CHOL, HDL, LDLCALC, TRIG, CHOLHDL, LDLDIRECT in the last 72 hours. Thyroid Function Tests No results for input(s): TSH, T4TOTAL, T3FREE, THYROIDAB in the last 72 hours.  Invalid input(s): FREET3 _____________  Dg Chest 2 View  Result Date: 01/18/2018 IMPRESSION: No acute abnormalities. Electronically Signed   By: Lavonia Dana M.D.   On: 01/18/2018 13:55    Diagnostic Studies/Procedures   n/a _____________  Disposition   Pt is being discharged home today in good condition.  Follow-up Plans & Appointments    Follow-up Information    Constance Haw, MD Follow up on 01/22/2018.   Specialty:  Cardiology Why:  Appointment time 9 AM Contact information: [address] [place] [address] [phone]          Discharge Instructions    Diet - low sodium heart healthy   Complete by:  As directed    Increase activity slowly   Complete by:  As directed       Discharge Medications   Allergies as of 01/20/2018      Reactions   Cyclobenzaprine Other (See Comments)   Unsteady gait      Medication List    STOP taking these medications   acetaminophen 325 MG tablet Commonly known as:  TYLENOL     TAKE these medications   albuterol 108 (90 Base) MCG/ACT inhaler Commonly known as:  PROVENTIL HFA;VENTOLIN HFA Inhale 2 puffs  into the lungs every 4 (four) hours as needed for wheezing or shortness of breath.   aspirin EC 81 MG tablet Take 81 mg by mouth daily.   co-enzyme Q-10 30 MG capsule Take 30 mg by mouth daily.   diclofenac sodium 1 % Gel Commonly known as:  VOLTAREN Apply 4 g topically 3 (three) times daily as needed (for back pain).   diltiazem 240 MG 24 hr capsule Commonly known as:  CARDIZEM CD Take 1 capsule (240 mg total) by mouth daily. Start taking on:  01/21/2018 What changed:    medication strength  how much to take   ferrous sulfate 325 (65 FE) MG tablet Take 1 tablet (325 mg total) by mouth daily.   HYDROcodone-acetaminophen 5-325 MG tablet Commonly known as:  NORCO/VICODIN Take 1 tablet by mouth every  8 (eight) hours as needed for moderate pain.   hydrocortisone cream 1 % Apply to affected area 2 times daily What changed:    how much to take  how to take this  when to take this  additional instructions   lidocaine 5 % Commonly known as:  LIDODERM Place 1 patch onto the skin daily for 30 doses. Remove & Discard patch within 12 hours or as directed by MD   Melatonin 3 MG Tabs Take 1 tablet (3 mg total) by mouth at bedtime.   metFORMIN 1000 MG tablet Commonly known as:  GLUCOPHAGE Take 1,000 mg by mouth 2 (two) times daily with a meal.   multivitamin with minerals Tabs tablet Take 1 tablet by mouth 2 (two) times daily.   nitroGLYCERIN 0.4 MG SL tablet Commonly known as:  NITROSTAT Place 1 tablet (0.4 mg total) under the tongue every 5 (five) minutes x 3 doses as needed for chest pain.   nystatin cream Commonly known as:  MYCOSTATIN Apply to affected area 2 times daily What changed:    how much to take  how to take this  when to take this  reasons to take this  additional instructions   pantoprazole 40 MG tablet Commonly known as:  PROTONIX Take 1 tablet (40 mg total) by mouth 2 (two) times daily before a meal.   polyethylene glycol  packet Commonly known as:  MIRALAX / GLYCOLAX Take 17 g by mouth as needed for moderate constipation.   pravastatin 40 MG tablet Commonly known as:  PRAVACHOL Take 1 tablet (40 mg total) by mouth every evening.   PRECISION QID TEST test strip Generic drug:  glucose blood Check glucose twice daily   tamsulosin 0.4 MG Caps capsule Commonly known as:  FLOMAX Take 0.4 mg by mouth daily.   vitamin C 250 MG tablet Commonly known as:  ASCORBIC ACID Take 1 tablet (250 mg total) by mouth daily.         Aspirin prescribed at discharge?  Yes High Intensity Statin Prescribed? (Lipitor 40-80mg  or Crestor 20-40mg ): No: LDL at goal  Beta Blocker Prescribed? No: CCB For EF <40%, was ACEI/ARB Prescribed? No:  ADP Receptor Inhibitor Prescribed? (i.e. Plavix etc.-Includes Medically Managed Patients): No:  For EF <40%, Aldosterone Inhibitor Prescribed? No:  Was EF assessed during THIS hospitalization? No:  Was Cardiac Rehab II ordered? (Included Medically managed Patients): No:    Outstanding Labs/Studies   None.   Duration of Discharge Encounter   Greater than 30 minutes including physician time.  Signed, Rise Mu, PA-C [facility] Pager: [phone] 01/20/2018, 9:20 AM  EP Attending  Agree with above. See my note as well for details.  Mikle Bosworth.D.

## 2018-01-20 NOTE — Progress Notes (Addendum)
Progress Note  Patient Name: [name] Date of Encounter: 01/20/2018  Primary Cardiologist: [name] Primary Electrophysiologist: Curt Bears   Subjective   No complaints this morning. Ferritin low at 15 on 10/26. HGB low though stable on 10/25 at 10.5. Remains in Afib with bradycardic ventricular response in the 40s to 50s bpm. BP soft overnight in the 90s to low 876O systolic. Remains on ASA and Cardizem 240 mg daily.   Inpatient Medications    Scheduled Meds: . aspirin EC  81 mg Oral Daily  . diclofenac sodium  2 g Topical QID  . diltiazem  240 mg Oral Daily  . hydrocortisone cream   Topical BID  . insulin aspart  0-9 Units Subcutaneous TID WC  . Melatonin  3 mg Oral QHS  . multivitamin with minerals  1 tablet Oral BID  . pantoprazole  40 mg Oral BID AC  . pravastatin  40 mg Oral QPM  . tamsulosin  0.4 mg Oral Daily   Continuous Infusions:  PRN Meds: acetaminophen, albuterol, HYDROcodone-acetaminophen, nitroGLYCERIN, ondansetron (ZOFRAN) IV, polyethylene glycol   Vital Signs    Vitals:   01/19/18 1443 01/19/18 1959 01/19/18 2127 01/20/18 0700  BP: 100/67 (!) 104/58 (!) 105/57   Pulse: 78  87   Resp:   18   Temp: 97.6 F (36.4 C)     TempSrc: Oral     SpO2: 98% 97% 94%   Weight:    76.1 kg  Height:        Intake/Output Summary (Last 24 hours) at 01/20/2018 0722 Last data filed at 01/19/2018 1837 Gross per 24 hour  Intake 720 ml  Output -  Net 720 ml   Filed Weights   01/18/18 1840 01/19/18 0314 01/20/18 0700  Weight: 75.8 kg 76.1 kg 76.1 kg    Telemetry    Afib with bradycardic ventricular response with ventricular rates in the 40s to 50s bpm, longest R-R interval 2.5 seconds - Personally Reviewed  ECG    n/a - Personally Reviewed  Physical Exam   GEN: No acute distress.   Neck: No JVD. Cardiac: Mildly bradycardic, irregularly irregular, no murmurs, rubs, or gallops.  Respiratory: Clear to auscultation bilaterally.  GI: Soft,  nontender, non-distended.   MS: No edema; No deformity. Neuro:  Alert and oriented x 3; Nonfocal.  Psych: Normal affect.  Labs    Chemistry Recent Labs  Lab 01/14/18 0705 01/16/18 1441 01/18/18 1414  NA 138 136 137  K 4.6 3.9 3.8  CL 101 104 106  CO2 23 25 21*  GLUCOSE 155* 120* 133*  BUN 7* 9 14  CREATININE 1.01 0.98 1.10  CALCIUM 9.7 9.5 8.5*  PROT 6.3*  --   --   ALBUMIN 3.5  --   --   AST 34  --   --   ALT 43  --   --   ALKPHOS 58  --   --   BILITOT 0.4  --   --   GFRNONAA >60 >60 >60  GFRAA >60 >60 >60  ANIONGAP 14 7 10      Hematology Recent Labs  Lab 01/14/18 0705 01/16/18 1441 01/18/18 1414  WBC 6.3 7.4 12.1*  RBC 3.93* 4.01* 3.86*  HGB 11.0* 11.0* 10.5*  HCT 35.4* 35.5* 34.5*  MCV 90.1 88.5 89.4  MCH 28.0 27.4 27.2  MCHC 31.1 31.0 30.4  RDW 17.5* 17.3* 17.3*  PLT 341 359 323    Cardiac Enzymes Recent Labs  Lab 01/14/18 0705 01/16/18 1441  TROPONINI <0.03 <0.03    Recent Labs  Lab 01/18/18 1425  TROPIPOC 0.00     BNPNo results for input(s): BNP, PROBNP in the last 168 hours.   DDimer No results for input(s): DDIMER in the last 168 hours.   Radiology    Dg Chest 2 View  Result Date: 01/18/2018 IMPRESSION: No acute abnormalities. Electronically Signed   By: Lavonia Dana M.D.   On: 01/18/2018 13:55    Cardiac Studies   Echo 09/2017: Study Conclusions  - Left ventricle: The cavity size was normal. Wall thickness was   increased in a pattern of mild LVH. Systolic function was normal.   The estimated ejection fraction was in the range of 55% to 60%. - Aortic valve: There was trivial regurgitation. - Mitral valve: There was mild regurgitation. - Left atrium: The atrium was mildly dilated. __________  LHC 09/2017: Conclusion     Dist RCA lesion is 40% stenosed.  Ost 1st Sept lesion is 95% stenosed.  LV end diastolic pressure is normal.   1. Single vessel CAD involving the ostium of the first septal perforator. Otherwise  nonobstructive disease. 2. Normal LVEDP  Plan: medical management. No obstructive disease in the RCA. Septal perforator is too small for PCI.  Recommend Aspirin 81mg  daily for moderate CAD.  __________  Patient Profile     77 y.o. male with history of nonobstructive CAD by LHC in 09/2017, persistent Afib/flutter s/p ablation for both not on anticoagulation given GI bleed, HFpEF, COPD, DM2, HTN, and HLD who was admitted for Afib with RVR.   Assessment & Plan    1. Persistent Afib with RVR: -Remains in Afib with bradycardic ventricular response as above -Ventricular rates have been in the 40s to 50s bpm with the longest R-R interval 2.5 seconds -Consider decreasing Cardizem to 180 mg daily, will discuss with MD -Not on [place] given history of GI bleed  2. Iron deficiency anemia: -HGB low, though stable on last check -Follow up as an outpatient with PCP  3. HFpEF: -He does not appear grossly volume up -Not requiring standing diuretic   4. Nonobstructive CAD: -By [facility] 09/2017 -Ruled out -Continue current medications  5. HLD: -LDL of 17 from 10/2017 -LFT normal 12/2017 -Remains on pravastatin   6. HTN: -BP on the soft side -Consider decreasing Cardizem as above   For questions or updates, please contact [place] Please consult [email or web address] for contact info under Cardiology/STEMI.    Signed, Christell Faith, PA-C [facility] Pager: [phone] 01/20/2018, 7:22 AM   Cardiology Attending  Patient seen and examined. Agree with above. The patient is stable today. On my exam the HR is in the 70 range. I have reviewed the lab findings and he is iron deficient. We will give a prescription for iron and vitamin C at discharge. He has hematology followup scheduled. When he is at home and more active and eating more salt, I suspect that his HR and BP will be less soft. followup as above.  Mikle Bosworth.D.

## 2018-01-20 NOTE — Discharge Instructions (Signed)
Atrial Fibrillation Atrial fibrillation is a type of irregular or rapid heartbeat (arrhythmia). In atrial fibrillation, the heart quivers continuously in a chaotic pattern. This occurs when parts of the heart receive disorganized signals that make the heart unable to pump blood normally. This can increase the risk for stroke, heart failure, and other heart-related conditions. There are different types of atrial fibrillation, including:  Paroxysmal atrial fibrillation. This type starts suddenly, and it usually stops on its own shortly after it starts.  Persistent atrial fibrillation. This type often lasts longer than a week. It may stop on its own or with treatment.  Long-lasting persistent atrial fibrillation. This type lasts longer than 12 months.  Permanent atrial fibrillation. This type does not go away.  Talk with your health care provider to learn about the type of atrial fibrillation that you have. What are the causes? This condition is caused by some heart-related conditions or procedures, including:  A heart attack.  Coronary artery disease.  Heart failure.  Heart valve conditions.  High blood pressure.  Inflammation of the sac that surrounds the heart (pericarditis).  Heart surgery.  Certain heart rhythm disorders, such as Wolf-Parkinson-White syndrome.  Other causes include:  Pneumonia.  Obstructive sleep apnea.  Blockage of an artery in the lungs (pulmonary embolism, or PE).  Lung cancer.  Chronic lung disease.  Thyroid problems, especially if the thyroid is overactive (hyperthyroidism).  Caffeine.  Excessive alcohol use or illegal drug use.  Use of some medicines, including certain decongestants and diet pills.  Sometimes, the cause cannot be found. What increases the risk? This condition is more likely to develop in:  People who are older in age.  People who smoke.  People who have diabetes mellitus.  People who are overweight  (obese).  Athletes who exercise vigorously.  What are the signs or symptoms? Symptoms of this condition include:  A feeling that your heart is beating rapidly or irregularly.  A feeling of discomfort or pain in your chest.  Shortness of breath.  Sudden light-headedness or weakness.  Getting tired easily during exercise.  In some cases, there are no symptoms. How is this diagnosed? Your health care provider may be able to detect atrial fibrillation when taking your pulse. If detected, this condition may be diagnosed with:  An electrocardiogram (ECG).  A Holter monitor test that records your heartbeat patterns over a 24-hour period.  Transthoracic echocardiogram (TTE) to evaluate how blood flows through your heart.  Transesophageal echocardiogram (TEE) to view more detailed images of your heart.  A stress test.  Imaging tests, such as a CT scan or chest X-ray.  Blood tests.  How is this treated? The main goals of treatment are to prevent blood clots from forming and to keep your heart beating at a normal rate and rhythm. The type of treatment that you receive depends on many factors, such as your underlying medical conditions and how you feel when you are experiencing atrial fibrillation. This condition may be treated with:  Medicine to slow down the heart rate, bring the heart's rhythm back to normal, or prevent clots from forming.  Electrical cardioversion. This is a procedure that resets your heart's rhythm by delivering a controlled, low-energy shock to the heart through your skin.  Different types of ablation, such as catheter ablation, catheter ablation with pacemaker, or surgical ablation. These procedures destroy the heart tissues that send abnormal signals. When the pacemaker is used, it is placed under your skin to help your heart beat in   a regular rhythm.  Follow these instructions at home:  Take over-the counter and prescription medicines only as told by your  health care provider.  If your health care provider prescribed a blood-thinning medicine (anticoagulant), take it exactly as told. Taking too much blood-thinning medicine can cause bleeding. If you do not take enough blood-thinning medicine, you will not have the protection that you need against stroke and other problems.  Do not use tobacco products, including cigarettes, chewing tobacco, and e-cigarettes. If you need help quitting, ask your health care provider.  If you have obstructive sleep apnea, manage your condition as told by your health care provider.  Do not drink alcohol.  Do not drink beverages that contain caffeine, such as coffee, soda, and tea.  Maintain a healthy weight. Do not use diet pills unless your health care provider approves. Diet pills may make heart problems worse.  Follow diet instructions as told by your health care provider.  Exercise regularly as told by your health care provider.  Keep all follow-up visits as told by your health care provider. This is important. How is this prevented?  Avoid drinking beverages that contain caffeine or alcohol.  Avoid certain medicines, especially medicines that are used for breathing problems.  Avoid certain herbs and herbal medicines, such as those that contain ephedra or ginseng.  Do not use illegal drugs, such as cocaine and amphetamines.  Do not smoke.  Manage your high blood pressure. Contact a health care provider if:  You notice a change in the rate, rhythm, or strength of your heartbeat.  You are taking an anticoagulant and you notice increased bruising.  You tire more easily when you exercise or exert yourself. Get help right away if:  You have chest pain, abdominal pain, sweating, or weakness.  You feel nauseous.  You notice blood in your vomit, bowel movement, or urine.  You have shortness of breath.  You suddenly have swollen feet and ankles.  You feel dizzy.  You have sudden weakness or  numbness of the face, arm, or leg, especially on one side of the body.  You have trouble speaking, trouble understanding, or both (aphasia).  Your face or your eyelid droops on one side. These symptoms may represent a serious problem that is an emergency. Do not wait to see if the symptoms will go away. Get medical help right away. Call your local emergency services (911 in the [place] Do not drive yourself to the hospital. This information is not intended to replace advice given to you by your health care provider. Make sure you discuss any questions you have with your health care provider. Document Released: 03/13/2005 Document Revised: 07/21/2015 Document Reviewed: 07/08/2014 Elsevier Interactive Patient Education  2018 [employer]  

## 2018-01-20 NOTE — Evaluation (Signed)
Physical Therapy Evaluation Patient Details Name: [name] MRN: [id number] DOB: 08-15-1940 Today's Date: 01/20/2018   History of Present Illness  Pt is a 77 y.o. M with significant PMH of nonobstructive CAD, perisistent Afib/flutter s/p ablation for both, HFpEF, iron deficiency anemia with prior GI bleed, COPD with ongoing tobacco abuse, DM2, HTN, and HLD who was admitted with Afib with RVR.  Clinical Impression  Pt admitted with above diagnosis. Pt currently with functional limitations due to the deficits listed below (see PT Problem List). On PT evaluation, patient ambulating 200 feet with no assistive device and supervision. Gait speed of 1.79 ft/s and Dynamic Gait Index of 14/24 indicates patient is at high risk of falls. HR 70s-103 bpm during mobility. Recommend HHPT at discharge to maximize functional independence. Pt will benefit from skilled PT to increase their independence and safety with mobility to allow discharge to the venue listed below.       Follow Up Recommendations Home health PT    Equipment Recommendations  None recommended by PT    Recommendations for Other Services       Precautions / Restrictions Precautions Precautions: Fall Restrictions Weight Bearing Restrictions: No      Mobility  Bed Mobility Overal bed mobility: Modified Independent                Transfers Overall transfer level: Modified independent Equipment used: None                Ambulation/Gait Ambulation/Gait assistance: Supervision Gait Distance (Feet): 200 Feet Assistive device: None Gait Pattern/deviations: Step-through pattern;Decreased dorsiflexion - left;Decreased dorsiflexion - right;Decreased stride length;Narrow base of support Gait velocity: 1.79 ft/s Gait velocity interpretation: <1.8 ft/sec, indicate of risk for recurrent falls General Gait Details: Pt with dynamic balance deficits, requiring supervision, but no overt LOB. Decreased reciprocal arm swing  and heel strike at initial contact noted.   Stairs            Wheelchair Mobility    Modified Rankin (Stroke Patients Only)       Balance                                 Standardized Balance Assessment Standardized Balance Assessment : Dynamic Gait Index   Dynamic Gait Index Level Surface: Moderate Impairment Change in Gait Speed: Moderate Impairment Gait with Horizontal Head Turns: Mild Impairment Gait with Vertical Head Turns: Mild Impairment Gait and Pivot Turn: Mild Impairment Step Over Obstacle: Mild Impairment Step Around Obstacles: Mild Impairment Steps: Mild Impairment Total Score: 14       Pertinent Vitals/Pain Pain Assessment: Faces Faces Pain Scale: Hurts a little bit Pain Location: chronic LBP Pain Descriptors / Indicators: Aching Pain Intervention(s): Monitored during session    Home Living Family/patient expects to be discharged to:: Private residence Living Arrangements: Children Available Help at Discharge: Family;Available PRN/intermittently Type of Home: Mobile home Home Access: Stairs to enter Entrance Stairs-Rails: Right;Left;Can reach both Entrance Stairs-Number of Steps: 3 Home Layout: One level Home Equipment: Walker - 4 wheels;Cane - single point;Shower seat      Prior Function Level of Independence: Independent with assistive device(s)         Comments: Uses walker as needed. Does not drive     Hand Dominance   Dominant Hand: Right    Extremity/Trunk Assessment   Upper Extremity Assessment Upper Extremity Assessment: Generalized weakness    Lower Extremity Assessment Lower Extremity Assessment: Generalized  weakness    Cervical / Trunk Assessment Cervical / Trunk Assessment: Kyphotic  Communication   Communication: No difficulties  Cognition Arousal/Alertness: Awake/alert Behavior During Therapy: WFL for tasks assessed/performed Overall Cognitive Status: Within Functional Limits for tasks  assessed                                        General Comments      Exercises     Assessment/Plan    PT Assessment Patient needs continued PT services  PT Problem List Decreased strength;Decreased activity tolerance;Decreased mobility;Decreased balance       PT Treatment Interventions DME instruction;Gait training;Stair training;Functional mobility training;Therapeutic activities;Therapeutic exercise;Balance training;Patient/family education    PT Goals (Current goals can be found in the Care Plan section)  Acute Rehab PT Goals Patient Stated Goal: "get therapy." PT Goal Formulation: With patient Time For Goal Achievement: 02/03/18 Potential to Achieve Goals: Good    Frequency Min 3X/week   Barriers to discharge        Co-evaluation               AM-PAC PT "6 Clicks" Daily Activity  Outcome Measure Difficulty turning over in bed (including adjusting bedclothes, sheets and blankets)?: None Difficulty moving from lying on back to sitting on the side of the bed? : None Difficulty sitting down on and standing up from a chair with arms (e.g., wheelchair, bedside commode, etc,.)?: None Help needed moving to and from a bed to chair (including a wheelchair)?: A Little Help needed walking in hospital room?: A Little Help needed climbing 3-5 steps with a railing? : A Little 6 Click Score: 21    End of Session Equipment Utilized During Treatment: Gait belt Activity Tolerance: Patient tolerated treatment well Patient left: with call bell/phone within reach Nurse Communication: Mobility status PT Visit Diagnosis: Unsteadiness on feet (R26.81);Other abnormalities of gait and mobility (R26.89)    Time: 1694-5038 PT Time Calculation (min) (ACUTE ONLY): 13 min   Charges:   PT Evaluation $PT Eval Moderate Complexity: 1 Mod          Ellamae Sia, [place], DPT Acute Rehabilitation Services Pager [phone] Office [phone]   Willy Eddy 01/20/2018, 10:31 AM

## 2018-01-21 ENCOUNTER — Encounter: Payer: Self-pay | Admitting: *Deleted

## 2018-01-21 ENCOUNTER — Other Ambulatory Visit: Payer: Self-pay

## 2018-01-21 ENCOUNTER — Inpatient Hospital Stay: Payer: Medicare Other

## 2018-01-21 VITALS — BP 94/53 | HR 84 | Temp 98.2°F | Resp 17

## 2018-01-21 DIAGNOSIS — Z7982 Long term (current) use of aspirin: Secondary | ICD-10-CM | POA: Diagnosis not present

## 2018-01-21 DIAGNOSIS — R5383 Other fatigue: Secondary | ICD-10-CM | POA: Diagnosis not present

## 2018-01-21 DIAGNOSIS — D508 Other iron deficiency anemias: Secondary | ICD-10-CM

## 2018-01-21 DIAGNOSIS — G629 Polyneuropathy, unspecified: Secondary | ICD-10-CM | POA: Diagnosis not present

## 2018-01-21 DIAGNOSIS — K909 Intestinal malabsorption, unspecified: Secondary | ICD-10-CM | POA: Diagnosis not present

## 2018-01-21 DIAGNOSIS — R21 Rash and other nonspecific skin eruption: Secondary | ICD-10-CM | POA: Diagnosis not present

## 2018-01-21 DIAGNOSIS — K922 Gastrointestinal hemorrhage, unspecified: Secondary | ICD-10-CM | POA: Diagnosis not present

## 2018-01-21 DIAGNOSIS — Z79899 Other long term (current) drug therapy: Secondary | ICD-10-CM | POA: Diagnosis not present

## 2018-01-21 DIAGNOSIS — D500 Iron deficiency anemia secondary to blood loss (chronic): Secondary | ICD-10-CM | POA: Diagnosis not present

## 2018-01-21 DIAGNOSIS — Z7984 Long term (current) use of oral hypoglycemic drugs: Secondary | ICD-10-CM | POA: Diagnosis not present

## 2018-01-21 MED ORDER — SODIUM CHLORIDE 0.9 % IV SOLN
510.0000 mg | Freq: Once | INTRAVENOUS | Status: AC
  Administered 2018-01-21: 510 mg via INTRAVENOUS
  Filled 2018-01-21: qty 17

## 2018-01-21 MED ORDER — SODIUM CHLORIDE 0.9 % IV SOLN
INTRAVENOUS | Status: DC
  Administered 2018-01-21: 09:00:00 via INTRAVENOUS
  Filled 2018-01-21: qty 250

## 2018-01-21 NOTE — Progress Notes (Signed)
Pt walked in to office this morning stating that he took a cab here to get iron and will wait all day if needed to obtain it. Lab results from 01/19/18 reviewed with Dr. Marin Olp and order received for pt to receive one dose of feraheme.

## 2018-01-21 NOTE — Patient Instructions (Signed)

## 2018-01-22 ENCOUNTER — Ambulatory Visit (INDEPENDENT_AMBULATORY_CARE_PROVIDER_SITE_OTHER): Payer: Medicare Other | Admitting: Cardiology

## 2018-01-22 ENCOUNTER — Encounter: Payer: Self-pay | Admitting: Cardiology

## 2018-01-22 VITALS — BP 116/60 | HR 97 | Ht 66.0 in | Wt 170.6 lb

## 2018-01-22 DIAGNOSIS — I4819 Other persistent atrial fibrillation: Secondary | ICD-10-CM | POA: Diagnosis not present

## 2018-01-22 DIAGNOSIS — I5032 Chronic diastolic (congestive) heart failure: Secondary | ICD-10-CM | POA: Diagnosis not present

## 2018-01-22 DIAGNOSIS — I2510 Atherosclerotic heart disease of native coronary artery without angina pectoris: Secondary | ICD-10-CM

## 2018-01-22 MED ORDER — DILTIAZEM HCL 30 MG PO TABS: 30.0000 mg | ORAL_TABLET | Freq: Four times a day (QID) | ORAL | 1 refills | Status: DC | PRN

## 2018-01-22 NOTE — Progress Notes (Signed)
Electrophysiology Office Note   Date:  01/22/2018   ID:  [name], DOB 1941-01-19, MRN [id number]  PCP:  [name], MD  Cardiologist:  [name] Primary Electrophysiologist:  Zahava Quant Meredith Leeds, MD    No chief complaint on file.    History of Present Illness: [name] is a 77 y.o. male who is being seen today for the evaluation of atrial flutter at the request of Rosaria Ferries. Presenting today for electrophysiology evaluation.  He has a history of diastolic heart failure, COPD, diabetes, hypertension, hyperlipidemia, paroxysmal atrial fibrillation and atrial flutter status post ablation, GI bleeding not anticoagulated.  He had a catheterization 10/05/2017 with nonobstructive coronary disease.  He has had multiple emergency room visits for atrial fibrillation.  Unfortunately he is not anticoagulated at this moment.  He has not had to return to the emergency room since being hospitalized.  He has been well controlled on the 180 mg of diltiazem.   Today, denies symptoms of palpitations, chest pain, shortness of breath, orthopnea, PND, lower extremity edema, claudication, dizziness, presyncope, syncope, bleeding, or neurologic sequela. The patient is tolerating medications without difficulties.  Overall he is doing well.  He was hospitalized last weekend with atrial fibrillation and rapid rates.  His diltiazem dose was increased to 240 mg.  Since that time he has felt well without major abnormality.   Past Medical History:  Diagnosis Date  . Anemia    takes Ferrous Sulfate daily  . Arthritis    "all over"  . Balance problem 01/2014  . CAP (community acquired pneumonia) 09/18/2014  . Cervical radiculopathy due to degenerative joint disease of spine   . COPD (chronic obstructive pulmonary disease) ([place])   . Coronary artery disease, non-occlusive    a. 03/2010 Nonocclusive disease by cath, performed for ST elevations on ECG;  b. 06/2013 Lexi MV: EF 60%, no ischemia.    . Diabetes mellitus type II    takes Metformin and Lantus daily  . Diastolic CHF, chronic ([place])    a. 12/2012 EF 55-60%, diast dysfxn, triv MR, mildly dil LA/RA.  [name] History of blood transfusion 1982   "when I had stomach OR"  . History of bronchitis    1998  . History of gastric ulcer   . HTN (hypertension)    takes Diltiazem daily  . Hyperlipidemia    takes Pravastatin daily  . Joint pain   . PAF (paroxysmal atrial fibrillation) ([facility])    Recurrent after atrial flutter (a. 07/2010 Status post caval tricuspid isthmus ablation by Dr. Midge Aver Metoprolol daily), currently controlled on flecainide plus diltiazem and Coumadin  . Weakness    numbness and tingling both hands   Past Surgical History:  Procedure Laterality Date  . ATRIAL ABLATION SURGERY  08/05/10   CTI ablation for atrial flutter by JA  . CARDIAC CATHETERIZATION  2012   nl LV function, no occlusive CAD, PAF  . CARDIOVERSION  12/07/2010    Successful direct current cardioversion with atrial fibrillation to normal sinus rhythm  . CARPAL TUNNEL RELEASE Bilateral 01/30/2014   Procedure: BILATERAL CARPAL TUNNEL RELEASE;  Surgeon: Marianna Payment, MD;  Location: [facility];  Service: Orthopedics;  Laterality: Bilateral;  . CATARACT EXTRACTION W/ INTRAOCULAR LENS  IMPLANT, BILATERAL Bilateral   . COLONOSCOPY [name] 12/02/2013   Procedure: COLONOSCOPY;  Surgeon: Irene Shipper, MD;  Location: [facility];  Service: Endoscopy;  Laterality: [name];  . ESOPHAGOGASTRODUODENOSCOPY [name] 09/22/2014   Procedure: ESOPHAGOGASTRODUODENOSCOPY (EGD);  Surgeon: Ronald Lobo, MD;  Location: [facility];  Service: Endoscopy;  Laterality: [name];  . ESOPHAGOGASTRODUODENOSCOPY [name] 07/11/2016   Procedure: ESOPHAGOGASTRODUODENOSCOPY (EGD);  Surgeon: Doran Stabler, MD;  Location: [facility] ENDOSCOPY;  Service: Endoscopy;  Laterality: [name];  . INCISION AND DRAINAGE ABSCESS / HEMATOMA OF BURSA / KNEE / THIGH Left 1998   knee  . KNEE BURSECTOMY Left 1998  .  LAPAROSCOPIC CHOLECYSTECTOMY  03/2010  . LEFT HEART CATH AND CORONARY ANGIOGRAPHY [name] 10/05/2017   Procedure: LEFT HEART CATH AND CORONARY ANGIOGRAPHY;  Surgeon: Martinique, Peter M, MD;  Location: [facility] CV LAB;  Service: Cardiovascular;  Laterality: [name];  . NM MYOVIEW LTD  07/22/2013   Normal EF ~60%, no ischemia or infarction.  [name] PARTIAL GASTRECTOMY  1982   subtotal; "took out 30% for ulcers"  . TRANSTHORACIC ECHOCARDIOGRAM  02/16/2014   EF 60%, no RWMA. - otherwise normal  . YAG LASER APPLICATION Bilateral      Current Outpatient Medications  Medication Sig Dispense Refill  . albuterol (PROVENTIL HFA;VENTOLIN HFA) 108 (90 Base) MCG/ACT inhaler Inhale 2 puffs into the lungs every 4 (four) hours as needed for wheezing or shortness of breath. 1 Inhaler 0  . aspirin EC 81 MG tablet Take 81 mg by mouth daily.    [name] co-enzyme Q-10 30 MG capsule Take 30 mg by mouth daily.     . diclofenac sodium (VOLTAREN) 1 % GEL Apply 4 g topically 3 (three) times daily as needed (for back pain). 1 Tube 0  . diltiazem (CARDIZEM CD) 240 MG 24 hr capsule Take 1 capsule (240 mg total) by mouth daily. 30 capsule 2  . ferrous sulfate 325 (65 FE) MG tablet Take 1 tablet (325 mg total) by mouth daily. 30 tablet 2  . glucose blood (PRECISION QID TEST) test strip Check glucose twice daily    . HYDROcodone-acetaminophen (NORCO/VICODIN) 5-325 MG tablet Take 1 tablet by mouth every 8 (eight) hours as needed for moderate pain.     . hydrocortisone cream 1 % Apply to affected area 2 times daily (Patient taking differently: Apply 1 application topically 3 (three) times daily. ) 15 g 0  . lidocaine (LIDODERM) 5 % Place 1 patch onto the skin daily for 30 doses. Remove & Discard patch within 12 hours or as directed by MD 30 patch 0  . Melatonin 3 MG TABS Take 1 tablet (3 mg total) by mouth at bedtime. 30 tablet 0  . metFORMIN (GLUCOPHAGE) 1000 MG tablet Take 1,000 mg by mouth 2 (two) times daily with a meal.     . Multiple  Vitamin (MULTIVITAMIN WITH MINERALS) TABS tablet Take 1 tablet by mouth 2 (two) times daily.     . nitroGLYCERIN (NITROSTAT) 0.4 MG SL tablet Place 1 tablet (0.4 mg total) under the tongue every 5 (five) minutes x 3 doses as needed for chest pain. 25 tablet 9  . nystatin cream (MYCOSTATIN) Apply to affected area 2 times daily (Patient taking differently: Apply 1 application topically 2 (two) times daily as needed for dry skin. ) 30 g 0  . pantoprazole (PROTONIX) 40 MG tablet Take 1 tablet (40 mg total) by mouth 2 (two) times daily before a meal.    . polyethylene glycol (MIRALAX / GLYCOLAX) packet Take 17 g by mouth as needed for moderate constipation. 10 each 0  . pravastatin (PRAVACHOL) 40 MG tablet Take 1 tablet (40 mg total) by mouth every evening. 90 tablet 3  . tamsulosin (FLOMAX) 0.4 MG CAPS capsule Take 0.4 mg  by mouth daily.    . vitamin C (ASCORBIC ACID) 250 MG tablet Take 1 tablet (250 mg total) by mouth daily. 30 tablet 2  . diltiazem (CARDIZEM) 30 MG tablet Take 1 tablet (30 mg total) by mouth 4 (four) times daily as needed (for afib). For afib 30 tablet 1   No current facility-administered medications for this visit.     Allergies:   Cyclobenzaprine   Social History:  The patient  reports that he has been smoking cigarettes. He has a 28.50 pack-year smoking history. He has never used smokeless tobacco. He reports that he does not drink alcohol or use drugs.   Family History:  The patient's family history includes Cancer in his mother; Heart attack in his brother and father.    ROS:  Please see the history of present illness.   Otherwise, review of systems is positive for palpitations, chest pain, back pain.   All other systems are reviewed and negative.   PHYSICAL EXAM: VS:  BP 116/60   Pulse 97   Ht 5\' 6"  (1.676 m)   Wt 170 lb 9.6 oz (77.4 kg)   SpO2 98%   BMI 27.54 kg/m  , BMI Body mass index is 27.54 kg/m. GEN: Well nourished, well developed, in no acute distress    HEENT: normal  Neck: no JVD, carotid bruits, or masses Cardiac: iRRR; no murmurs, rubs, or gallops,no edema  Respiratory:  clear to auscultation bilaterally, normal work of breathing GI: soft, nontender, nondistended, + BS MS: no deformity or atrophy  Skin: warm and dry Neuro:  Strength and sensation are intact Psych: euthymic mood, full affect  EKG:  EKG is ordered today. Personal review of the ekg ordered shows AF, rate 97   Recent Labs: 11/08/2017: B Natriuretic Peptide 267.5 12/11/2017: TSH 5.100 01/14/2018: ALT 43; Magnesium 1.6 01/18/2018: BUN 14; Creatinine, Ser 1.10; Hemoglobin 10.5; Platelets 323; Potassium 3.8; Sodium 137    Lipid Panel     Component Value Date/Time   CHOL 82 11/09/2017 0040   TRIG 136 11/09/2017 0040   HDL 38 (L) 11/09/2017 0040   CHOLHDL 2.2 11/09/2017 0040   VLDL 27 11/09/2017 0040   LDLCALC 17 11/09/2017 0040     Wt Readings from Last 3 Encounters:  01/22/18 170 lb 9.6 oz (77.4 kg)  01/20/18 167 lb 12.3 oz (76.1 kg)  01/14/18 169 lb 15.6 oz (77.1 kg)      Other studies Reviewed: Additional studies/ records that were reviewed today include: LHC 10/05/17  Review of the above records today demonstrates:   Dist RCA lesion is 40% stenosed.  Ost 1st Sept lesion is 95% stenosed.  LV end diastolic pressure is normal.   1. Single vessel CAD involving the ostium of the first septal perforator. Otherwise nonobstructive disease. 2. Normal LVEDP  TTE 10/05/17 - Left ventricle: The cavity size was normal. Wall thickness was   increased in a pattern of mild LVH. Systolic function was normal.   The estimated ejection fraction was in the range of 55% to 60%. - Aortic valve: There was trivial regurgitation. - Mitral valve: There was mild regurgitation. - Left atrium: The atrium was mildly dilated.  ASSESSMENT AND PLAN:  1.  Atypical atrial flutter/atrial fibrillation: Currently not anticoagulated due to recurrent GI bleeds.  He was recently  hospitalized and his diltiazem dose was increased.  Since then he is felt well without major abnormality.  We Saina Waage give him PRN diltiazem for rapid heart rates which may help to  keep him out of the hospital.  2.  Chronic diastolic heart failure: No signs of volume overload  3.  Tobacco abuse: Cessation encouraged    Current medicines are reviewed at length with the patient today.   The patient does not have concerns regarding his medicines.  The following changes were made today: Start PRN diltiazem  Labs/ tests ordered today include:  Orders Placed This Encounter  Procedures  . EKG 12-Lead     Disposition:   FU with Deamber Buckhalter 1.5 months  Signed, Winda Summerall Meredith Leeds, MD  01/22/2018 9:42 AM     [facility] [address] [place] [address] [phone] (office) [phone] (fax)

## 2018-01-22 NOTE — Patient Instructions (Signed)
Medication Instructions:  Your physician has recommended you make the following change in your medication:  1. TAKE Diltiazem 30 mg every 4 hours as needed for atrial fibrillation  If you need a refill on your cardiac medications before your next appointment, please call your pharmacy.   Lab work: None ordered  Testing/Procedures: None ordered  Follow-Up: Keep your currently scheduled appointment on 03/08/18 @ 9:45am with Dr. Curt Bears.  Thank you for choosing [facility]!!   Trinidad Curet, RN [phone]

## 2018-01-28 ENCOUNTER — Telehealth: Payer: Self-pay | Admitting: Emergency Medicine

## 2018-01-28 NOTE — Telephone Encounter (Signed)
Lost to followup 

## 2018-01-29 ENCOUNTER — Telehealth: Payer: Self-pay | Admitting: Cardiology

## 2018-01-29 NOTE — Telephone Encounter (Signed)
New Message          Patient's daughter is calling today for her father, he is asking for a call back concerning his blood pressure and heart rate.

## 2018-01-30 DIAGNOSIS — I110 Hypertensive heart disease with heart failure: Secondary | ICD-10-CM | POA: Diagnosis not present

## 2018-01-30 DIAGNOSIS — I480 Paroxysmal atrial fibrillation: Secondary | ICD-10-CM | POA: Diagnosis not present

## 2018-02-01 DIAGNOSIS — I480 Paroxysmal atrial fibrillation: Secondary | ICD-10-CM | POA: Diagnosis not present

## 2018-02-01 DIAGNOSIS — I110 Hypertensive heart disease with heart failure: Secondary | ICD-10-CM | POA: Diagnosis not present

## 2018-02-04 DIAGNOSIS — I480 Paroxysmal atrial fibrillation: Secondary | ICD-10-CM | POA: Diagnosis not present

## 2018-02-04 DIAGNOSIS — I110 Hypertensive heart disease with heart failure: Secondary | ICD-10-CM | POA: Diagnosis not present

## 2018-02-05 NOTE — Telephone Encounter (Signed)
Pt returned my call. Pt reports AFib issues.  States he has been taking the PRN Cardizem, but it is not helping. Reports SOB w/ ADLs. Pt aware I will have Dr. Curt Bears review and Iet him know recommendation/s.  Pt agreeable to plan.

## 2018-02-06 DIAGNOSIS — I480 Paroxysmal atrial fibrillation: Secondary | ICD-10-CM | POA: Diagnosis not present

## 2018-02-06 DIAGNOSIS — I110 Hypertensive heart disease with heart failure: Secondary | ICD-10-CM | POA: Diagnosis not present

## 2018-02-08 DIAGNOSIS — I480 Paroxysmal atrial fibrillation: Secondary | ICD-10-CM | POA: Diagnosis not present

## 2018-02-08 DIAGNOSIS — I110 Hypertensive heart disease with heart failure: Secondary | ICD-10-CM | POA: Diagnosis not present

## 2018-02-08 IMAGING — CR DG CHEST 2V
2 series · 2 of 2 positions shown · non-contrast
Comparison: October 22, 2016

CLINICAL DATA: Shortness of breath and cough

EXAM:
CHEST  2 VIEW

[w chest lat]
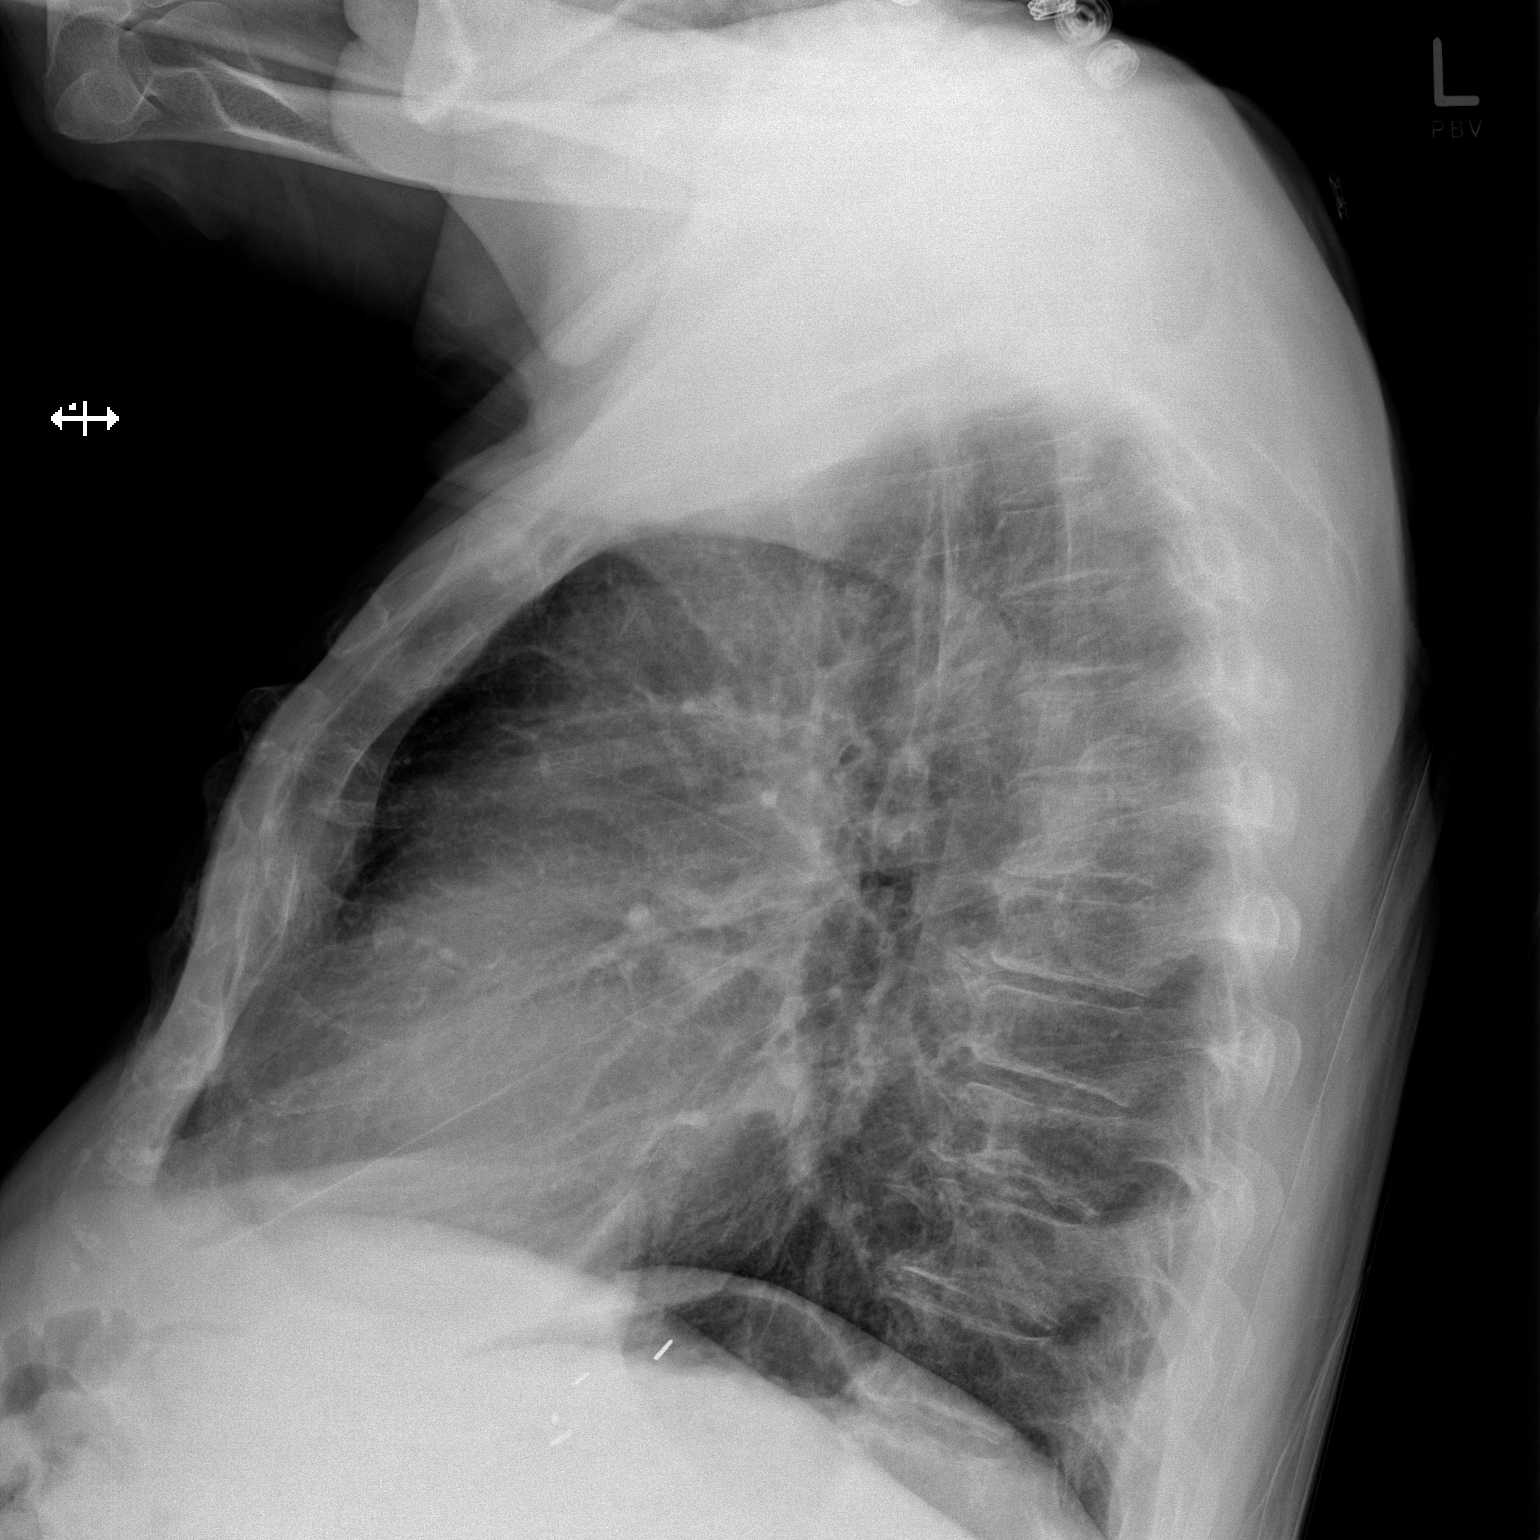

[x chest ap]
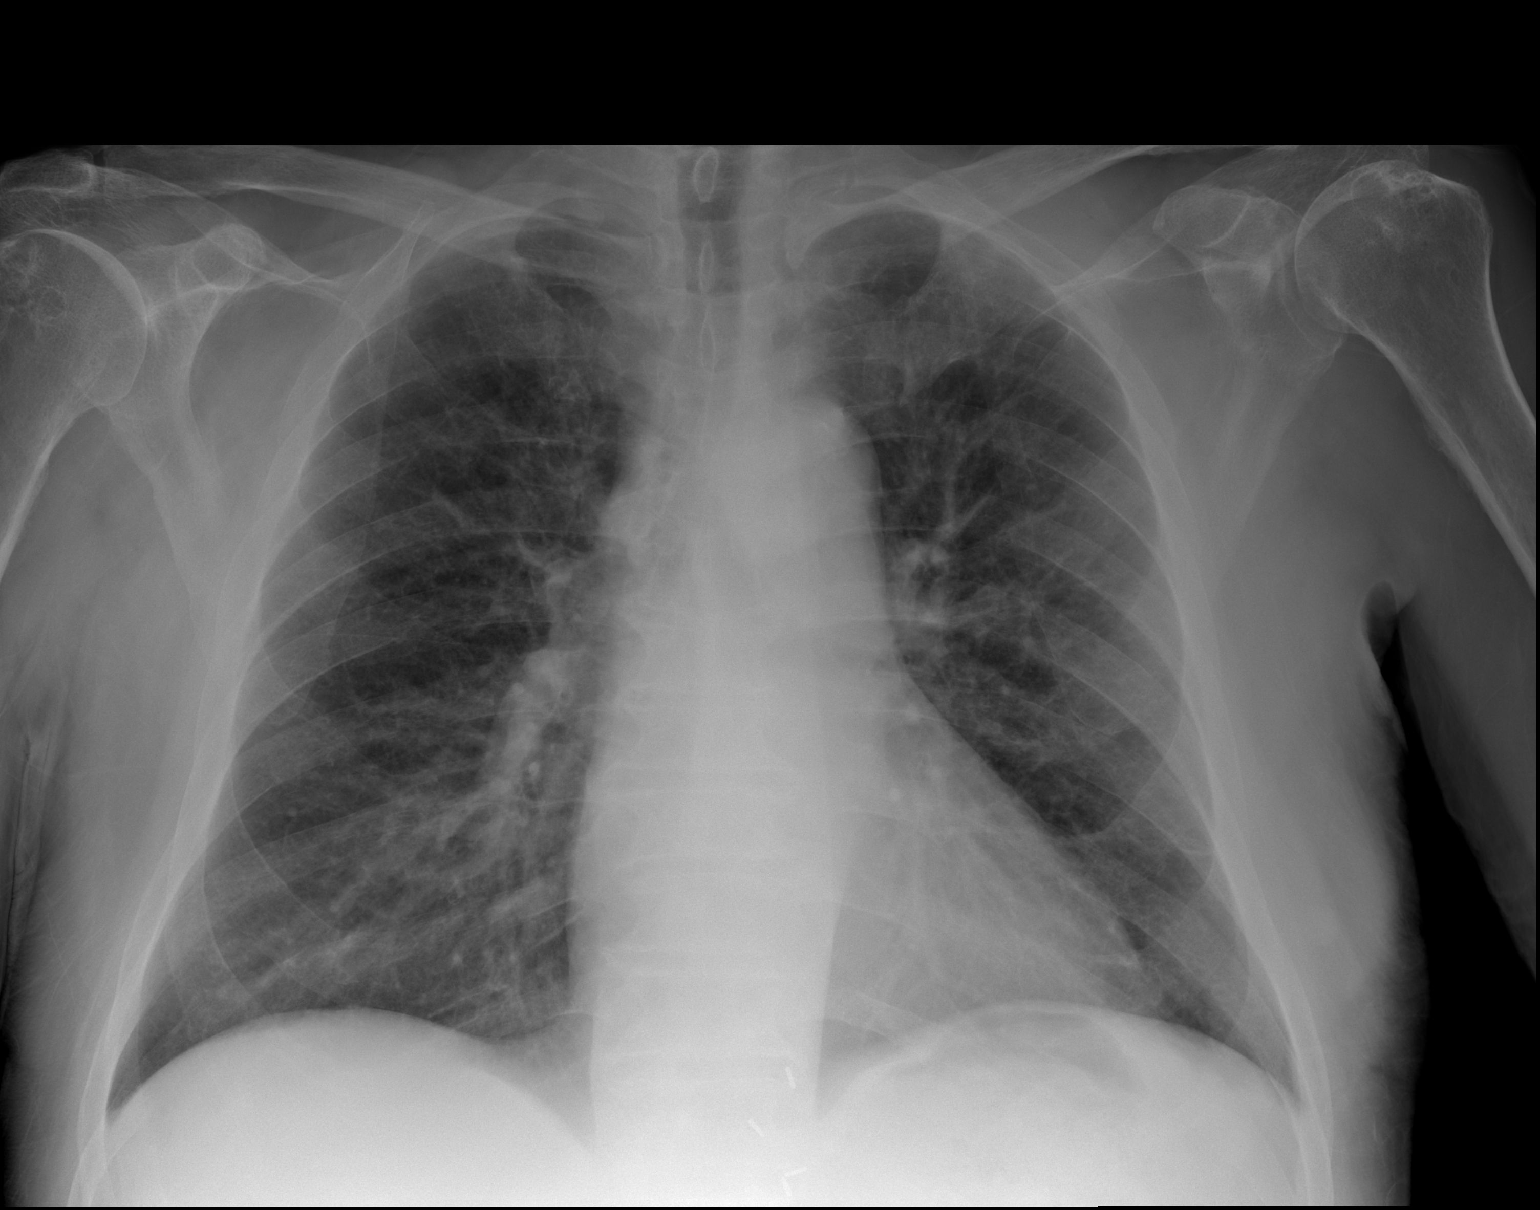

[2 of 2 positions shown; findings below may reference images not displayed]

FINDINGS: There is no edema or consolidation. The heart size and pulmonary
vascularity are normal. No adenopathy. No bone lesions. There are
surgical clips at the gastroesophageal junction.
IMPRESSION: No edema or consolidation.

## 2018-02-08 NOTE — Telephone Encounter (Signed)
Pt scheduled to see physician Monday, 11/18

## 2018-02-11 ENCOUNTER — Ambulatory Visit: Payer: Medicare Other | Admitting: Cardiology

## 2018-02-11 DIAGNOSIS — I480 Paroxysmal atrial fibrillation: Secondary | ICD-10-CM | POA: Diagnosis not present

## 2018-02-11 DIAGNOSIS — I110 Hypertensive heart disease with heart failure: Secondary | ICD-10-CM | POA: Diagnosis not present

## 2018-02-11 IMAGING — CR DG CHEST 2V
2 series · 2 of 2 positions shown · non-contrast
Comparison: November 28, 2016

CLINICAL DATA: Weakness.  Hypertension.

EXAM:
CHEST  2 VIEW

[w chest pa]
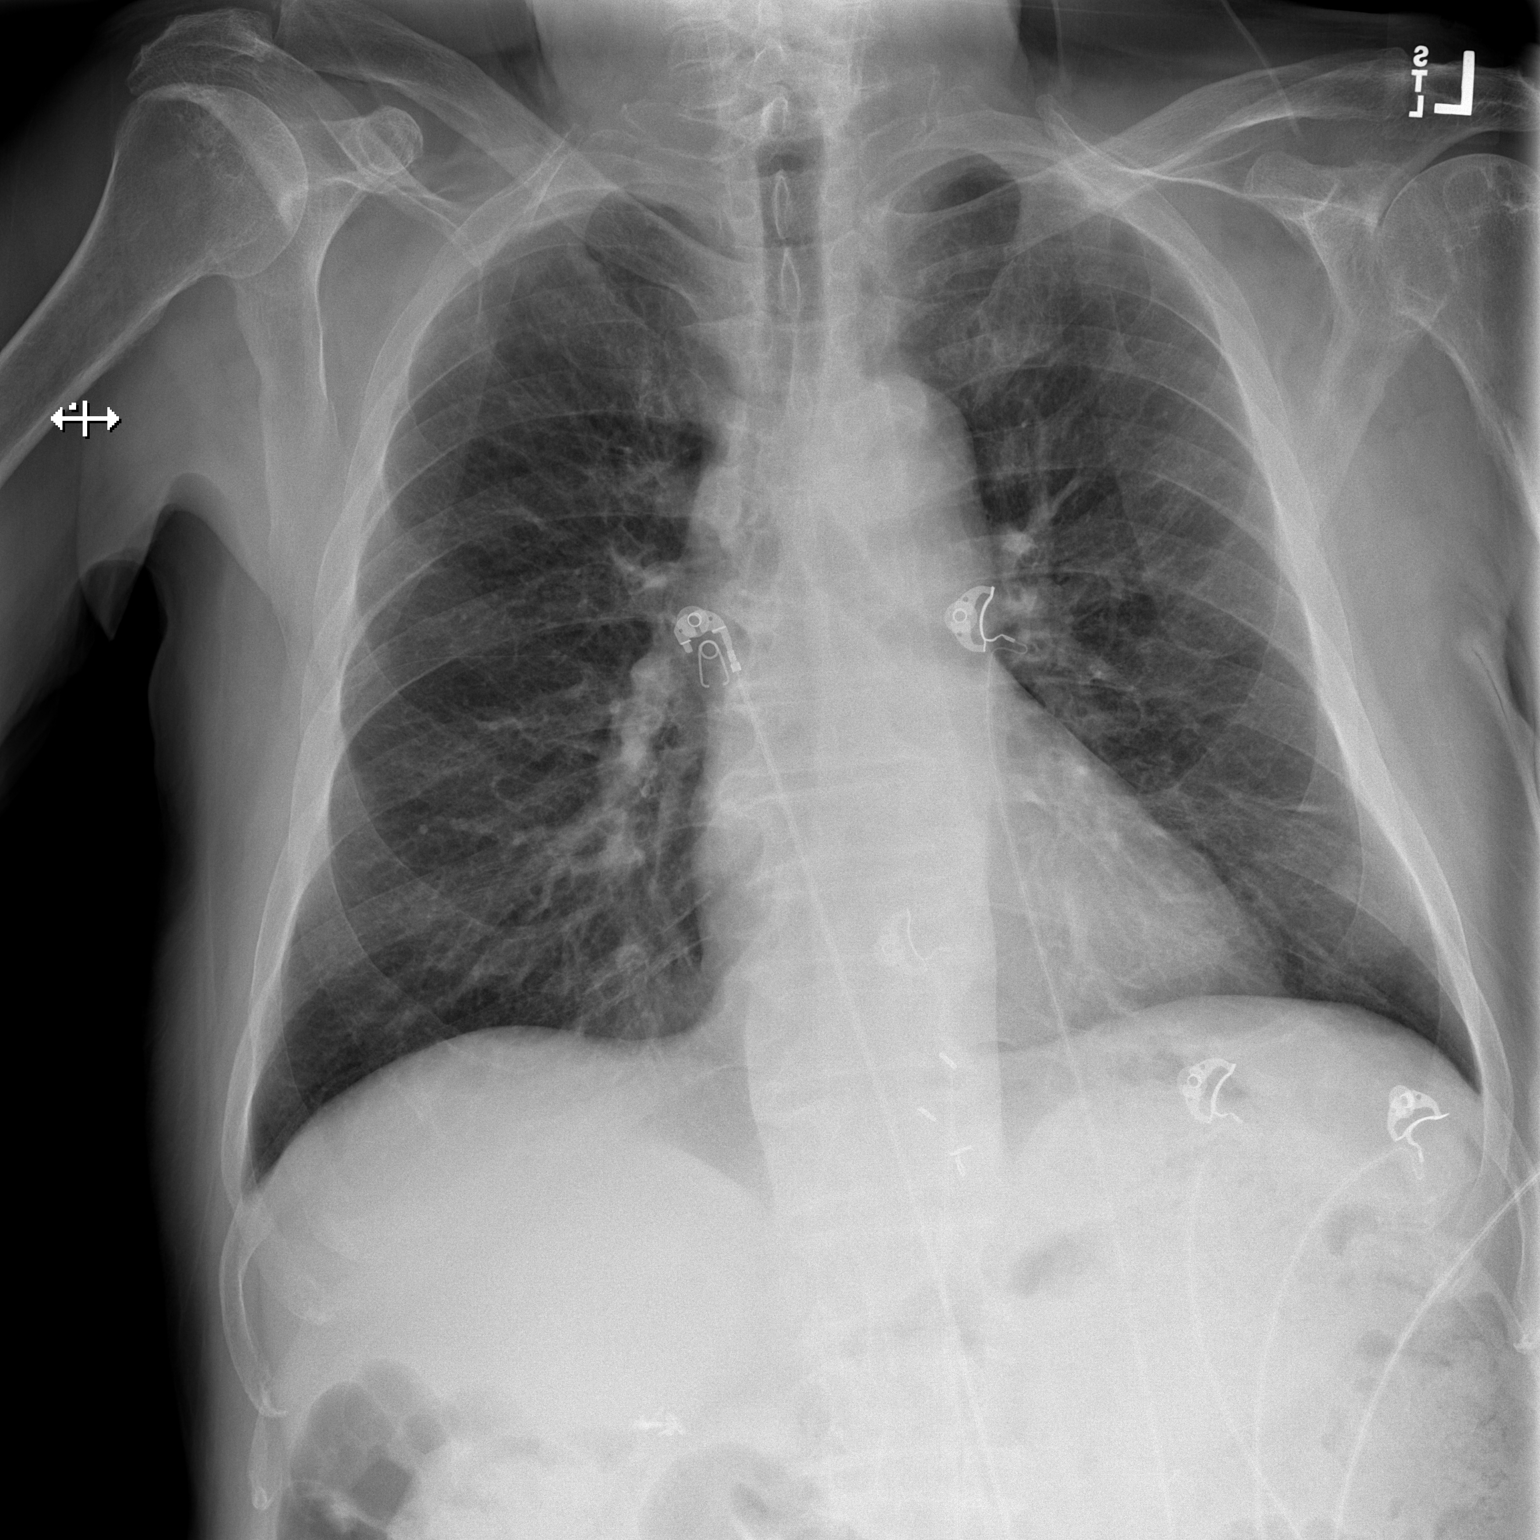

[w chest lat]
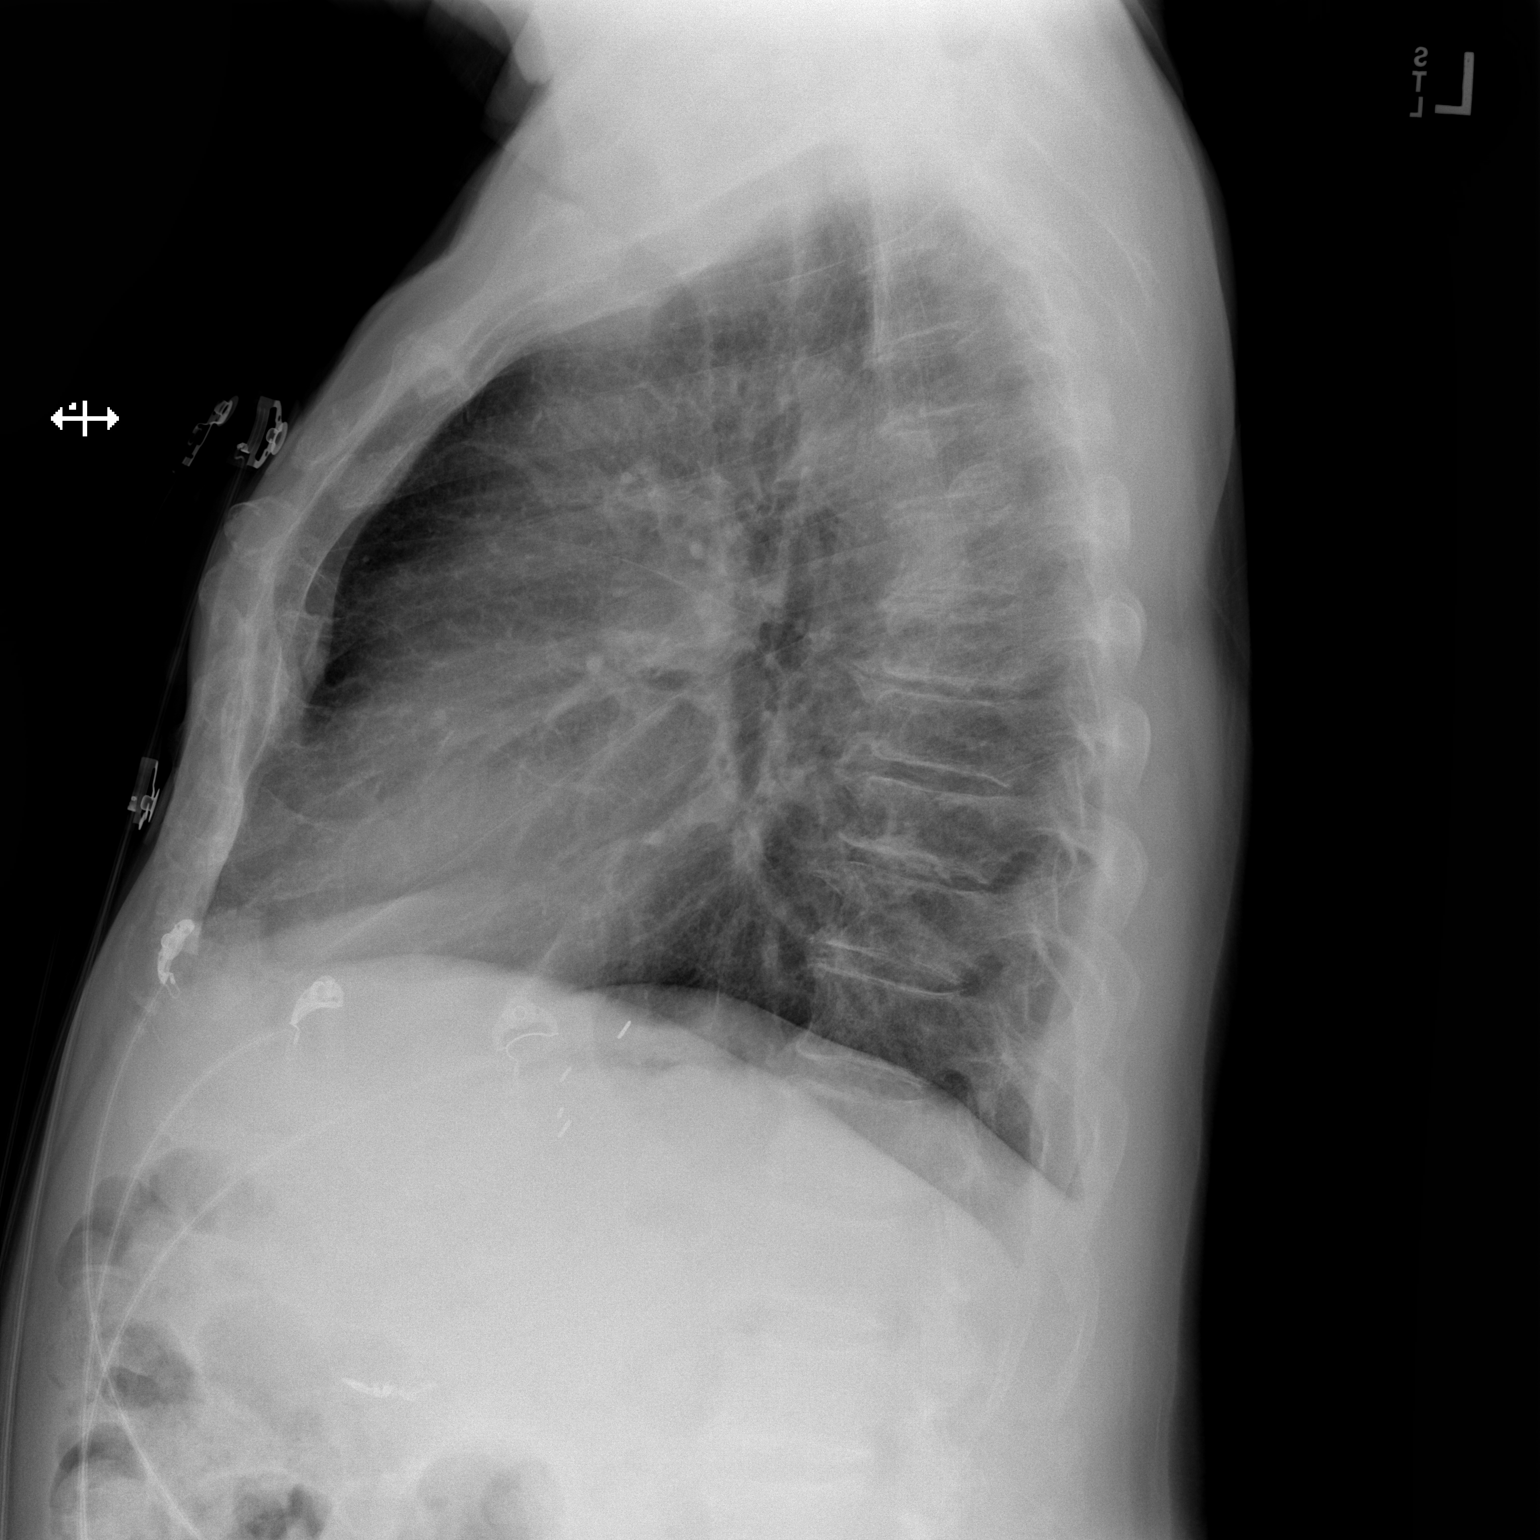

[2 of 2 positions shown; findings below may reference images not displayed]

FINDINGS: There is slight left lower lobe atelectasis. There is no edema or
consolidation. Heart size and pulmonary vascularity are normal. No
adenopathy. There is mild degenerative change in the thoracic spine.
There are surgical clips at the gastroesophageal junction as well as
in the right upper quadrant of the abdomen.
IMPRESSION: Mild left base atelectasis. No edema or consolidation. Stable
cardiac silhouette.

## 2018-02-12 DIAGNOSIS — I110 Hypertensive heart disease with heart failure: Secondary | ICD-10-CM | POA: Diagnosis not present

## 2018-02-12 DIAGNOSIS — I480 Paroxysmal atrial fibrillation: Secondary | ICD-10-CM | POA: Diagnosis not present

## 2018-02-13 ENCOUNTER — Encounter: Payer: Self-pay | Admitting: Family

## 2018-02-13 ENCOUNTER — Other Ambulatory Visit: Payer: Self-pay

## 2018-02-13 ENCOUNTER — Inpatient Hospital Stay: Payer: Medicare Other

## 2018-02-13 ENCOUNTER — Inpatient Hospital Stay: Payer: Medicare Other | Attending: Hematology & Oncology | Admitting: Family

## 2018-02-13 VITALS — BP 117/64 | HR 84 | Temp 97.7°F | Resp 19

## 2018-02-13 DIAGNOSIS — D500 Iron deficiency anemia secondary to blood loss (chronic): Secondary | ICD-10-CM

## 2018-02-13 DIAGNOSIS — Z7984 Long term (current) use of oral hypoglycemic drugs: Secondary | ICD-10-CM | POA: Diagnosis not present

## 2018-02-13 DIAGNOSIS — Z7982 Long term (current) use of aspirin: Secondary | ICD-10-CM | POA: Diagnosis not present

## 2018-02-13 DIAGNOSIS — I110 Hypertensive heart disease with heart failure: Secondary | ICD-10-CM | POA: Diagnosis not present

## 2018-02-13 DIAGNOSIS — M545 Low back pain: Secondary | ICD-10-CM | POA: Insufficient documentation

## 2018-02-13 DIAGNOSIS — Z79899 Other long term (current) drug therapy: Secondary | ICD-10-CM | POA: Diagnosis not present

## 2018-02-13 DIAGNOSIS — I480 Paroxysmal atrial fibrillation: Secondary | ICD-10-CM | POA: Diagnosis not present

## 2018-02-13 DIAGNOSIS — K922 Gastrointestinal hemorrhage, unspecified: Secondary | ICD-10-CM | POA: Diagnosis not present

## 2018-02-13 DIAGNOSIS — D508 Other iron deficiency anemias: Secondary | ICD-10-CM

## 2018-02-13 LAB — CMP (CANCER CENTER ONLY)
ALT: 32 U/L (ref 0–44)
AST: 27 U/L (ref 15–41)
Albumin: 3.4 g/dL — ABNORMAL LOW (ref 3.5–5.0)
Alkaline Phosphatase: 75 U/L (ref 38–126)
Anion gap: 10 (ref 5–15)
BUN: 14 mg/dL (ref 8–23)
CHLORIDE: 102 mmol/L (ref 98–111)
CO2: 27 mmol/L (ref 22–32)
Calcium: 10 mg/dL (ref 8.9–10.3)
Creatinine: 1 mg/dL (ref 0.61–1.24)
GFR, Est AFR Am: >60 mL/min (ref >60)
Glucose, Bld: 167 mg/dL — ABNORMAL HIGH (ref 70–99)
POTASSIUM: 4.3 mmol/L (ref 3.5–5.1)
Sodium: 139 mmol/L (ref 135–145)
Total Bilirubin: <0.2 mg/dL — ABNORMAL LOW (ref 0.3–1.2)
Total Protein: 6.4 g/dL — ABNORMAL LOW (ref 6.5–8.1)

## 2018-02-13 LAB — CBC WITH DIFFERENTIAL (CANCER CENTER ONLY)
Abs Immature Granulocytes: 0.02 10*3/uL (ref 0.00–0.07)
BASOS ABS: 0.1 10*3/uL (ref 0.0–0.1)
BASOS PCT: 1 %
EOS ABS: 0.2 10*3/uL (ref 0.0–0.5)
Eosinophils Relative: 3 %
HCT: 40.8 % (ref 39.0–52.0)
Hemoglobin: 12.8 g/dL — ABNORMAL LOW (ref 13.0–17.0)
IMMATURE GRANULOCYTES: 0 %
Lymphocytes Relative: 11 %
Lymphs Abs: 0.7 10*3/uL (ref 0.7–4.0)
MCH: 28.8 pg (ref 26.0–34.0)
MCHC: 31.4 g/dL (ref 30.0–36.0)
MCV: 91.7 fL (ref 80.0–100.0)
Monocytes Absolute: 0.8 10*3/uL (ref 0.1–1.0)
Monocytes Relative: 12 %
NEUTROS ABS: 4.7 10*3/uL (ref 1.7–7.7)
NEUTROS PCT: 73 %
NRBC: 0 % (ref 0.0–0.2)
PLATELETS: 267 10*3/uL (ref 150–400)
RBC: 4.45 MIL/uL (ref 4.22–5.81)
RDW: 19.1 % — AB (ref 11.5–15.5)
WBC: 6.4 10*3/uL (ref 4.0–10.5)

## 2018-02-13 LAB — IRON AND TIBC
IRON: 86 ug/dL (ref 42–163)
Saturation Ratios: 27 % (ref 20–55)
TIBC: 321 ug/dL (ref 202–409)
UIBC: 236 ug/dL (ref 117–376)

## 2018-02-13 LAB — RETICULOCYTES
Immature Retic Fract: 8.1 % (ref 2.3–15.9)
RBC.: 4.45 MIL/uL (ref 4.22–5.81)
RETIC COUNT ABSOLUTE: 95.7 10*3/uL (ref 19.0–186.0)
Retic Ct Pct: 2.2 % (ref 0.4–3.1)

## 2018-02-13 LAB — FERRITIN: FERRITIN: 43 ng/mL (ref 24–336)

## 2018-02-13 NOTE — Progress Notes (Addendum)
Hematology and Oncology Follow Up Visit  Abdikadir Fohl [id number] 29-May-1940 77 y.o. 02/13/2018   Principle Diagnosis:  Iron deficiency anemia secondary to intermittent GI bleed  Current Therapy:   IV iron as indicated Supportive transfusions as needed   Interim History: Mr. Juhasz is here today with his daughter for follow-up. He is having lower back pain off and on that is bothersome. He uses a cane when ambulating for support. No falls or syncopal episodes to report.  He was hospitalized several weeks ago with atrial fib and discussed AV nodal ablation and PPM implantation in the future. He is now home on Cardizem 240 mg PO daily with 30 mg PO as needed for breakthrough a fib.  He has occasional palpitations as well as weakness and fatigue.  He denies noting any episodes of bleeding. No bruising or petechiae.  No fever, chills, n/v, cough, rash, dizziness, chest pain, abdominal pain or changes in bowel or bladder habits.  No swelling or tenderness in his extremities. He numbness and tingling in his feet.  No lymphadenopathy noted on exam.  He is eating well and is doing his best to stay hydrated despite frequent urination. He has an appointment with his PCP tomorrow. His weight is stable.   ECOG Performance Status: 1 - Symptomatic but completely ambulatory  Medications:  Allergies as of 02/13/2018      Reactions   Cyclobenzaprine Other (See Comments)   Unsteady gait      Medication List        Accurate as of 02/13/18  8:48 AM. Always use your most recent med list.          albuterol 108 (90 Base) MCG/ACT inhaler Commonly known as:  PROVENTIL HFA;VENTOLIN HFA Inhale 2 puffs into the lungs every 4 (four) hours as needed for wheezing or shortness of breath.   aspirin EC 81 MG tablet Take 81 mg by mouth daily.   co-enzyme Q-10 30 MG capsule Take 30 mg by mouth daily.   diclofenac sodium 1 % Gel Commonly known as:  VOLTAREN Apply 4 g topically 3 (three) times  daily as needed (for back pain).   diltiazem 240 MG 24 hr capsule Commonly known as:  CARDIZEM CD Take 1 capsule (240 mg total) by mouth daily.   diltiazem 30 MG tablet Commonly known as:  CARDIZEM Take 1 tablet (30 mg total) by mouth 4 (four) times daily as needed (for afib). For afib   ferrous sulfate 325 (65 FE) MG tablet Take 1 tablet (325 mg total) by mouth daily.   HYDROcodone-acetaminophen 5-325 MG tablet Commonly known as:  NORCO/VICODIN Take 1 tablet by mouth every 8 (eight) hours as needed for moderate pain.   hydrocortisone cream 1 % Apply to affected area 2 times daily   Melatonin 3 MG Tabs Take 1 tablet (3 mg total) by mouth at bedtime.   metFORMIN 1000 MG tablet Commonly known as:  GLUCOPHAGE Take 1,000 mg by mouth 2 (two) times daily with a meal.   multivitamin with minerals Tabs tablet Take 1 tablet by mouth 2 (two) times daily.   nitroGLYCERIN 0.4 MG SL tablet Commonly known as:  NITROSTAT Place 1 tablet (0.4 mg total) under the tongue every 5 (five) minutes x 3 doses as needed for chest pain.   nystatin cream Commonly known as:  MYCOSTATIN Apply to affected area 2 times daily   pantoprazole 40 MG tablet Commonly known as:  PROTONIX Take 1 tablet (40 mg total) by mouth 2 (two)  times daily before a meal.   polyethylene glycol packet Commonly known as:  MIRALAX / GLYCOLAX Take 17 g by mouth as needed for moderate constipation.   pravastatin 40 MG tablet Commonly known as:  PRAVACHOL Take 1 tablet (40 mg total) by mouth every evening.   PRECISION QID TEST test strip Generic drug:  glucose blood Check glucose twice daily   tamsulosin 0.4 MG Caps capsule Commonly known as:  FLOMAX Take 0.4 mg by mouth daily.   vitamin C 250 MG tablet Commonly known as:  ASCORBIC ACID Take 1 tablet (250 mg total) by mouth daily.       Allergies:  Allergies  Allergen Reactions  . Cyclobenzaprine Other (See Comments)    Unsteady gait    Past Medical  History, Surgical history, Social history, and Family History were reviewed and updated.  Review of Systems: All other 10 point review of systems is negative.   Physical Exam:  oral temperature is 97.7 F (36.5 C). His blood pressure is 117/64 and his pulse is 84. His respiration is 19 and oxygen saturation is 99%.   Wt Readings from Last 3 Encounters:  01/22/18 170 lb 9.6 oz (77.4 kg)  01/20/18 167 lb 12.3 oz (76.1 kg)  01/14/18 169 lb 15.6 oz (77.1 kg)    Ocular: Sclerae unicteric, pupils equal, round and reactive to light Ear-nose-throat: Oropharynx clear, dentition fair Lymphatic: No cervical, supraclavicular or axillary adenopathy Lungs no rales or rhonchi, good excursion bilaterally Heart regular rate and rhythm, no murmur appreciated Abd soft, nontender, positive bowel sounds, no liver or spleen tip palpated on exam, no fluid wave  MSK no focal spinal tenderness, no joint edema Neuro: non-focal, well-oriented, appropriate affect Breasts: Deferred   Lab Results  Component Value Date   WBC 6.4 02/13/2018   HGB 12.8 (L) 02/13/2018   HCT 40.8 02/13/2018   MCV 91.7 02/13/2018   PLT 267 02/13/2018   Lab Results  Component Value Date   FERRITIN 15 (L) 01/19/2018   IRON 17 (L) 01/19/2018   TIBC 336 01/19/2018   UIBC 319 01/19/2018   IRONPCTSAT 5 (L) 01/19/2018   Lab Results  Component Value Date   RETICCTPCT 2.2 02/13/2018   RBC 4.45 02/13/2018   RBC 4.45 02/13/2018   No results found for: KPAFRELGTCHN, LAMBDASER, KAPLAMBRATIO No results found for: IGGSERUM, IGA, IGMSERUM No results found for: Kathrynn Ducking, MSPIKE, SPEI   Chemistry      Component Value Date/Time   NA 137 01/18/2018 1414   NA 139 10/20/2016 1110   K 3.8 01/18/2018 1414   K 4.0 10/20/2016 1110   CL 106 01/18/2018 1414   CO2 21 (L) 01/18/2018 1414   CO2 26 10/20/2016 1110   BUN 14 01/18/2018 1414   BUN 15.2 10/20/2016 1110   CREATININE 1.10  01/18/2018 1414   CREATININE 0.98 01/02/2018 0813   CREATININE 0.8 10/20/2016 1110      Component Value Date/Time   CALCIUM 8.5 (L) 01/18/2018 1414   CALCIUM 9.6 10/20/2016 1110   ALKPHOS 58 01/14/2018 0705   ALKPHOS 85 10/20/2016 1110   AST 34 01/14/2018 0705   AST 32 01/02/2018 0813   AST 33 10/20/2016 1110   ALT 43 01/14/2018 0705   ALT 47 (H) 01/02/2018 0813   ALT 42 10/20/2016 1110   BILITOT 0.4 01/14/2018 0705   BILITOT <0.2 (L) 01/02/2018 0813   BILITOT <0.22 10/20/2016 1110       Impression and Plan:  Mr. Porcaro is a very pleasant 77 yo caucasian gentleman with iron deficiency anemia secondary to intermittent GI blood loss and malabsorption on protonix.  He is symptomatic as mentioned above with iron saturation of 5% several weeks ago and he received 1 dose of IV iron.  His iron saturation is now 27% and ferritin 43. No iron needed at this time.  We will plan to see him in another 6 weeks for follow-up.  They will contact our office with any questions or concerns. We can certainly see her sooner if need be.   Laverna Peace, NP 11/20/20198:48 AM

## 2018-02-14 ENCOUNTER — Ambulatory Visit: Payer: Medicare Other

## 2018-02-14 DIAGNOSIS — E119 Type 2 diabetes mellitus without complications: Secondary | ICD-10-CM | POA: Diagnosis not present

## 2018-02-14 DIAGNOSIS — R300 Dysuria: Secondary | ICD-10-CM | POA: Diagnosis not present

## 2018-02-14 DIAGNOSIS — B3742 Candidal balanitis: Secondary | ICD-10-CM | POA: Diagnosis not present

## 2018-02-14 DIAGNOSIS — R109 Unspecified abdominal pain: Secondary | ICD-10-CM | POA: Diagnosis not present

## 2018-02-14 DIAGNOSIS — D509 Iron deficiency anemia, unspecified: Secondary | ICD-10-CM | POA: Diagnosis not present

## 2018-02-14 DIAGNOSIS — I10 Essential (primary) hypertension: Secondary | ICD-10-CM | POA: Diagnosis not present

## 2018-02-14 DIAGNOSIS — Z794 Long term (current) use of insulin: Secondary | ICD-10-CM | POA: Diagnosis not present

## 2018-02-14 DIAGNOSIS — R350 Frequency of micturition: Secondary | ICD-10-CM | POA: Diagnosis not present

## 2018-02-14 DIAGNOSIS — R310 Gross hematuria: Secondary | ICD-10-CM | POA: Diagnosis not present

## 2018-02-18 ENCOUNTER — Encounter: Payer: Self-pay | Admitting: *Deleted

## 2018-02-19 ENCOUNTER — Ambulatory Visit (INDEPENDENT_AMBULATORY_CARE_PROVIDER_SITE_OTHER): Payer: Medicare Other | Admitting: Cardiology

## 2018-02-19 ENCOUNTER — Encounter: Payer: Self-pay | Admitting: Cardiology

## 2018-02-19 VITALS — BP 114/58 | HR 94 | Ht 66.0 in | Wt 174.0 lb

## 2018-02-19 DIAGNOSIS — I5032 Chronic diastolic (congestive) heart failure: Secondary | ICD-10-CM | POA: Diagnosis not present

## 2018-02-19 DIAGNOSIS — I2510 Atherosclerotic heart disease of native coronary artery without angina pectoris: Secondary | ICD-10-CM

## 2018-02-19 DIAGNOSIS — I4821 Permanent atrial fibrillation: Secondary | ICD-10-CM | POA: Diagnosis not present

## 2018-02-19 MED ORDER — DILTIAZEM HCL ER COATED BEADS 300 MG PO CP24: 300.0000 mg | ORAL_CAPSULE | Freq: Every day | ORAL | 6 refills | Status: DC

## 2018-02-19 NOTE — Progress Notes (Signed)
Electrophysiology Office Note   Date:  02/19/2018   ID:  Bernice Mullin, DOB 10-06-1940, MRN [id number]  PCP:  [name], MD  Cardiologist:  [name] Primary Electrophysiologist:  Naiara Lombardozzi Meredith Leeds, MD    No chief complaint on file.    History of Present Illness: Yazir Koerber is a 77 y.o. male who is being seen today for the evaluation of atrial flutter at the request of Rosaria Ferries. Presenting today for electrophysiology evaluation.  He has a history of diastolic heart failure, COPD, diabetes, hypertension, hyperlipidemia, paroxysmal atrial fibrillation and atrial flutter status post ablation, GI bleeding not anticoagulated.  He had a catheterization 10/05/2017 with nonobstructive coronary disease.  He has had multiple emergency room visits for atrial fibrillation.  He is tolerating his 240 mg of diltiazem without major issue.  He has had a few episodes where he has tachycardia and has to take his 30 mg dose.  His main symptoms are palpitations, weakness, and fatigue.  He continues to smoke up to 4 cigarettes a day.   Today, denies symptoms of chest pain, shortness of breath, orthopnea, PND, lower extremity edema, claudication, dizziness, presyncope, syncope, bleeding, or neurologic sequela. The patient is tolerating medications without difficulties.    Past Medical History:  Diagnosis Date  . Anemia    takes Ferrous Sulfate daily  . Arthritis    "all over"  . Balance problem 01/2014  . CAP (community acquired pneumonia) 09/18/2014  . Cervical radiculopathy due to degenerative joint disease of spine   . COPD (chronic obstructive pulmonary disease) ([place])   . Coronary artery disease, non-occlusive    a. 03/2010 Nonocclusive disease by cath, performed for ST elevations on ECG;  b. 06/2013 [place] MV: EF 60%, no ischemia.  . Diabetes mellitus type II    takes Metformin and Lantus daily  . Diastolic CHF, chronic ([place])    a. 12/2012 EF 55-60%, diast dysfxn, triv MR, mildly  dil LA/RA.  [name] History of blood transfusion 1982   "when I had stomach OR"  . History of bronchitis    1998  . History of gastric ulcer   . HTN (hypertension)    takes Diltiazem daily  . Hyperlipidemia    takes Pravastatin daily  . Joint pain   . PAF (paroxysmal atrial fibrillation) ([facility])    Recurrent after atrial flutter (a. 07/2010 Status post caval tricuspid isthmus ablation by Dr. Midge Aver Metoprolol daily), currently controlled on flecainide plus diltiazem and Coumadin  . Weakness    numbness and tingling both hands   Past Surgical History:  Procedure Laterality Date  . ATRIAL ABLATION SURGERY  08/05/10   CTI ablation for atrial flutter by JA  . CARDIAC CATHETERIZATION  2012   nl LV function, no occlusive CAD, PAF  . CARDIOVERSION  12/07/2010    Successful direct current cardioversion with atrial fibrillation to normal sinus rhythm  . CARPAL TUNNEL RELEASE Bilateral 01/30/2014   Procedure: BILATERAL CARPAL TUNNEL RELEASE;  Surgeon: Marianna Payment, MD;  Location: [facility];  Service: Orthopedics;  Laterality: Bilateral;  . CATARACT EXTRACTION W/ INTRAOCULAR LENS  IMPLANT, BILATERAL Bilateral   . COLONOSCOPY [name] 12/02/2013   Procedure: COLONOSCOPY;  Surgeon: Irene Shipper, MD;  Location: [facility];  Service: Endoscopy;  Laterality: [name];  . ESOPHAGOGASTRODUODENOSCOPY [name] 09/22/2014   Procedure: ESOPHAGOGASTRODUODENOSCOPY (EGD);  Surgeon: Ronald Lobo, MD;  [facility] END[facility]COPY;  Service: Endoscopy;  Laterality: [name];  . ESOPHAGOGASTRODUODENOSCOPY [name] 07/11/2016   Procedure: ESOPHAGOGASTRODUODENOSCOPY (EGD);  Surgeon: Estill Cotta  Dorothea Glassman, MD;  Location: [facility];  Service: Endoscopy;  Laterality: [name];  . INCISION AND DRAINAGE ABSCESS / HEMATOMA OF BURSA / KNEE / THIGH Left 1998   knee  . KNEE BURSECTOMY Left 1998  . LAPAROSCOPIC CHOLECYSTECTOMY  03/2010  . LEFT HEART CATH AND CORONARY ANGIOGRAPHY [name] 10/05/2017   Procedure: LEFT HEART CATH AND CORONARY ANGIOGRAPHY;  Surgeon:  Martinique, Peter M, MD;  Location: [facility];  Service: Cardiovascular;  Laterality: [name];  . NM MYOVIEW LTD  07/22/2013   Normal EF ~60%, no ischemia or infarction.  [name] PARTIAL GASTRECTOMY  1982   subtotal; "took out 30% for ulcers"  . TRANSTHORACIC ECHOCARDIOGRAM  02/16/2014   EF 60%, no RWMA. - otherwise normal  . YAG LASER APPLICATION Bilateral      Current Outpatient Medications  Medication Sig Dispense Refill  . albuterol (PROVENTIL HFA;VENTOLIN HFA) 108 (90 Base) MCG/ACT inhaler Inhale 2 puffs into the lungs every 4 (four) hours as needed for wheezing or shortness of breath. 1 Inhaler 0  . aspirin EC 81 MG tablet Take 81 mg by mouth daily.    [name] co-enzyme Q-10 30 MG capsule Take 30 mg by mouth daily.     . diclofenac sodium (VOLTAREN) 1 % GEL Apply 4 g topically 3 (three) times daily as needed (for back pain). 1 Tube 0  . diltiazem (CARDIZEM CD) 240 MG 24 hr capsule Take 1 capsule (240 mg total) by mouth daily. 30 capsule 2  . diltiazem (CARDIZEM) 30 MG tablet Take 1 tablet (30 mg total) by mouth 4 (four) times daily as needed (for afib). For afib 30 tablet 1  . ferrous sulfate 325 (65 FE) MG tablet Take 1 tablet (325 mg total) by mouth daily. 30 tablet 2  . glucose blood (PRECISION QID TEST) test strip Check glucose twice daily    . HYDROcodone-acetaminophen (NORCO/VICODIN) 5-325 MG tablet Take 1 tablet by mouth every 8 (eight) hours as needed for moderate pain.     . hydrocortisone cream 1 % Apply to affected area 2 times daily 15 g 0  . Melatonin 3 MG TABS Take 1 tablet (3 mg total) by mouth at bedtime. 30 tablet 0  . metFORMIN (GLUCOPHAGE) 1000 MG tablet Take 1,000 mg by mouth 2 (two) times daily with a meal.     . Multiple Vitamin (MULTIVITAMIN WITH MINERALS) TABS tablet Take 1 tablet by mouth 2 (two) times daily.     . nitroGLYCERIN (NITROSTAT) 0.4 MG SL tablet Place 1 tablet (0.4 mg total) under the tongue every 5 (five) minutes x 3 doses as needed for chest pain. 25 tablet  9  . nystatin cream (MYCOSTATIN) Apply to affected area 2 times daily 30 g 0  . pantoprazole (PROTONIX) 40 MG tablet Take 1 tablet (40 mg total) by mouth 2 (two) times daily before a meal.    . polyethylene glycol (MIRALAX / GLYCOLAX) packet Take 17 g by mouth as needed for moderate constipation. 10 each 0  . pravastatin (PRAVACHOL) 40 MG tablet Take 1 tablet (40 mg total) by mouth every evening. 90 tablet 3  . tamsulosin (FLOMAX) 0.4 MG CAPS capsule Take 0.4 mg by mouth daily.    . vitamin C (ASCORBIC ACID) 250 MG tablet Take 1 tablet (250 mg total) by mouth daily. 30 tablet 2   No current facility-administered medications for this visit.     Allergies:   Cyclobenzaprine   Social History:  The patient  reports that he has  been smoking cigarettes. He has a 28.50 pack-year smoking history. He has never used smokeless tobacco. He reports that he does not drink alcohol or use drugs.   Family History:  The patient's family history includes Breast cancer in his mother; Heart attack in his brother and father.   ROS:  Please see the history of present illness.   Otherwise, review of systems is positive for none.   All other systems are reviewed and negative.   PHYSICAL EXAM: VS:  BP (!) 114/58   Pulse 94   Ht 5\' 6"  (1.676 m)   Wt 174 lb (78.9 kg)   SpO2 92%   BMI 28.08 kg/m  , BMI Body mass index is 28.08 kg/m. GEN: Well nourished, well developed, in no acute distress  HEENT: normal  Neck: no JVD, carotid bruits, or masses Cardiac: iRRR; no murmurs, rubs, or gallops,no edema  Respiratory:  clear to auscultation bilaterally, normal work of breathing GI: soft, nontender, nondistended, + BS MS: no deformity or atrophy  Skin: warm and dry Neuro:  Strength and sensation are intact Psych: euthymic mood, full affect  EKG:  EKG is not ordered today. Personal review of the ekg ordered 10/29/149 shows AF, rate 97   Recent Labs: 11/08/2017: B Natriuretic Peptide 267.5 12/11/2017: TSH  5.100 01/14/2018: Magnesium 1.6 02/13/2018: ALT 32; BUN 14; Creatinine 1.00; Hemoglobin 12.8; Platelet Count 267; Potassium 4.3; Sodium 139    Lipid Panel     Component Value Date/Time   CHOL 82 11/09/2017 0040   TRIG 136 11/09/2017 0040   HDL 38 (L) 11/09/2017 0040   CHOLHDL 2.2 11/09/2017 0040   VLDL 27 11/09/2017 0040   LDLCALC 17 11/09/2017 0040     Wt Readings from Last 3 Encounters:  02/19/18 174 lb (78.9 kg)  01/22/18 170 lb 9.6 oz (77.4 kg)  01/20/18 167 lb 12.3 oz (76.1 kg)      Other studies Reviewed: Additional studies/ records that were reviewed today include: LHC 10/05/17  Review of the above records today demonstrates:   Dist RCA lesion is 40% stenosed.  Ost 1st Sept lesion is 95% stenosed.  LV end diastolic pressure is normal.   1. Single vessel CAD involving the ostium of the first septal perforator. Otherwise nonobstructive disease. 2. Normal LVEDP  TTE 10/05/17 - Left ventricle: The cavity size was normal. Wall thickness was   increased in a pattern of mild LVH. Systolic function was normal.   The estimated ejection fraction was in the range of 55% to 60%. - Aortic valve: There was trivial regurgitation. - Mitral valve: There was mild regurgitation. - Left atrium: The atrium was mildly dilated.  ASSESSMENT AND PLAN:  1.  Atypical atrial flutter/atrial fibrillation: Has recurrent GI bleeds and thus is not anticoagulated.  Has had some further episodes of tachycardia due to his atrial fibrillation requiring PRN diltiazem.  We Cleotilde Spadaccini thus increase his daily diltiazem dose to 300 mg.    2.  Chronic diastolic heart failure: No signs of volume overload  3.  Tobacco abuse: cessation encouraged   Current medicines are reviewed at length with the patient today.   The patient does not have concerns regarding his medicines.  The following changes were made today: Increase diltiazem  Labs/ tests ordered today include:  No orders of the defined types were  placed in this encounter.    Disposition:   FU with Tiasia Weberg 6 months  Signed, Dannell Raczkowski Meredith Leeds, MD  02/19/2018 8:40 AM  [address] [place] [address] [phone] (office) [phone] (fax)

## 2018-02-19 NOTE — Patient Instructions (Addendum)
Medication Instructions:  Your physician has recommended you make the following change in your medication:  1. INCREASE Diltiazem to 300 mg once a day  * If you need a refill on your cardiac medications before your next appointment, please call your pharmacy.   Labwork: None ordered  Testing/Procedures: None ordered  Follow-Up: Your physician wants you to follow-up in: 6 months with Dr. Curt Bears.  You will receive a reminder letter in the mail two months in advance. If you don't receive a letter, please call our office to schedule the follow-up appointment.   Thank you for choosing [facility]!!   Trinidad Curet, RN [phone]

## 2018-02-19 NOTE — Addendum Note (Signed)
Addended by: Stanton Kidney on: 02/19/2018 08:46 AM   Modules accepted: Orders

## 2018-02-20 DIAGNOSIS — I480 Paroxysmal atrial fibrillation: Secondary | ICD-10-CM | POA: Diagnosis not present

## 2018-02-20 DIAGNOSIS — I110 Hypertensive heart disease with heart failure: Secondary | ICD-10-CM | POA: Diagnosis not present

## 2018-02-21 ENCOUNTER — Telehealth: Payer: Self-pay | Admitting: Cardiology

## 2018-02-21 NOTE — Telephone Encounter (Signed)
Called by daughter because pt's HR was high-140. He was tolerating this well, just concerned about the rate. It actually came down to the 80's while we were on the phone, he had just taken his second Diltiazem 30 mg.  No further recommendations.  Kerin Ransom PA-C 02/21/2018 2:46 PM

## 2018-02-22 DIAGNOSIS — I110 Hypertensive heart disease with heart failure: Secondary | ICD-10-CM | POA: Diagnosis not present

## 2018-02-22 DIAGNOSIS — I480 Paroxysmal atrial fibrillation: Secondary | ICD-10-CM | POA: Diagnosis not present

## 2018-02-26 ENCOUNTER — Telehealth (HOSPITAL_COMMUNITY): Payer: Self-pay | Admitting: *Deleted

## 2018-02-26 NOTE — Telephone Encounter (Signed)
Left detailed message informing pt we scheduled him to be seen tomorrow.  Informed of when to arrive tomorrow.  Asked him to call the office to deny/confirm tomorrows appt.

## 2018-02-26 NOTE — Telephone Encounter (Signed)
Patient called in this morning at 930 stating he was up all night with elevated HR - he finally took his medication around 8am and his heart rate is anywhere from 49-180. BP is in the 110/60 range. Instructed pt to take a 30mg  of cardizem now and call back with update in a few hours.  Patient called back in the 11am - stating his heart rate is in the 130-140s. He is very frustrated in regards to how frequently his HR is out of control despite medications. Instructed pt I would talk with Dr. Curt Bears nurse in regards to plan as the increased cardizem dose since the 26th appt has not helped his frequent elevated HRs.   Patient understands sherri will be in touch with patient for next steps.

## 2018-02-27 ENCOUNTER — Ambulatory Visit (INDEPENDENT_AMBULATORY_CARE_PROVIDER_SITE_OTHER): Payer: Medicare Other | Admitting: Cardiology

## 2018-02-27 ENCOUNTER — Encounter: Payer: Self-pay | Admitting: Cardiology

## 2018-02-27 VITALS — BP 126/78 | HR 108 | Ht 66.0 in | Wt 167.4 lb

## 2018-02-27 DIAGNOSIS — I2510 Atherosclerotic heart disease of native coronary artery without angina pectoris: Secondary | ICD-10-CM | POA: Diagnosis not present

## 2018-02-27 DIAGNOSIS — Z01812 Encounter for preprocedural laboratory examination: Secondary | ICD-10-CM | POA: Diagnosis not present

## 2018-02-27 DIAGNOSIS — I495 Sick sinus syndrome: Secondary | ICD-10-CM

## 2018-02-27 DIAGNOSIS — I4819 Other persistent atrial fibrillation: Secondary | ICD-10-CM

## 2018-02-27 DIAGNOSIS — I5032 Chronic diastolic (congestive) heart failure: Secondary | ICD-10-CM | POA: Diagnosis not present

## 2018-02-27 NOTE — Patient Instructions (Signed)
Medication Instructions:  Your physician recommends that you continue on your current medications as directed. Please refer to the Current Medication list given to you today.     * If you need a refill on your cardiac medications before your next appointment, please call your pharmacy. *   Labwork: Pre procedure lab work today: BMET & CBC * Will notify you of abnormal results, otherwise continue current treatment plan.*   Testing/Procedures: Your physician has recommended that you have a pacemaker inserted. A pacemaker is a small device that is placed under the skin of your chest or abdomen to help control abnormal heart rhythms. This device uses electrical pulses to prompt the heart to beat at a normal rate. Pacemakers are used to treat heart rhythms that are too slow. Wire (leads) are attached to the pacemaker that goes into the chambers of you heart. This is done in the hospital and usually requires and overnight stay. Please see the instruction sheet given to you today for more information. Please follow the instructions below, located under the special instructions section.   Follow-Up: Your physician recommends that you schedule a wound check appointment 10-14 days, after your procedure on 03/06/18, with the device clinic.  Your physician recommends that you schedule a follow up appointment in 91 days, after your procedure on 03/06/18, with Dr. Curt Bears.  * Please note that any paperwork needing to be filled out by the provider will need to be addressed at the front desk prior to seeing the provider.  Please note that any FMLA, disability or other documents regarding health condition is subject to a $25.00 charge that must be received prior to completion of paperwork in the form of a money order or check. *  Thank you for choosing [facility]!!   Trinidad Curet, RN [phone]   Any Other Special Instructions Will Be Listed Below (If Applicable).     Implantable Device  Instructions  You are scheduled for:                  _____ Implantable Pacemaker  on  03/06/2018  with Dr. Curt Bears.  1.   Please arrive at the [facility], Entrance "A"  at [facility] at  9:30 a.m. on the day of your procedure. (The address is [address]  2. Do not eat or drink after midnight the night before your procedure.  3.   Complete pre procedure  lab work on 02/27/18.  The lab at [facility] is open from 8:00 AM to 4:30 PM.  You do not have to be fasting.  4.   A) -Hold the following medications the morning of your procedure:    1. Metformin   2.  Flomax           - All of your remaining medications may be taken with a small amount of water the morning of your procedure.  5.  Plan for an overnight stay.  Bring your insurance cards and a list of you medications.  6.  Wash your chest and neck with surgical scrub the evening before and the morning of  your procedure.  Rinse well. Please review the surgical scrub instruction sheet given to you.  7. Your chest will need to be shaved prior to this procedure (if needed). We ask that you do this yourself at home 1 to 2 days before or if uncomfortable/unable to do yourself, then it will be performed by the hospital staff the  day of.                                                                                                                * If you have ANY questions after you get home, please call Trinidad Curet, RN @ [phone].  * Every attempt is made to prevent procedures from being rescheduled.  Due to the nature of  Electrophysiology, rescheduling can happen.  The physician is always aware and directs the staff when this occurs.   Pacemaker Implantation, Adult Pacemaker implantation is a procedure to place a pacemaker inside your chest. A pacemaker is a small computer that sends electrical signals to the heart and helps your heart beat normally. A pacemaker also stores information about  your heart rhythms. You may need pacemaker implantation if you:  Have a slow heartbeat (bradycardia).  Faint (syncope).  Have shortness of breath (dyspnea) due to heart problems.  The pacemaker attaches to your heart through a wire, called a lead. Sometimes just one lead is needed. Other times, there will be two leads. There are two types of pacemakers:  Transvenous pacemaker. This type is placed under the skin or muscle of your chest. The lead goes through a vein in the chest area to reach the inside of the heart.  Epicardial pacemaker. This type is placed under the skin or muscle of your chest or belly. The lead goes through your chest to the outside of the heart.  Tell a health care provider about:  Any allergies you have.  All medicines you are taking, including vitamins, herbs, eye drops, creams, and over-the-counter medicines.  Any problems you or family members have had with anesthetic medicines.  Any blood or bone disorders you have.  Any surgeries you have had.  Any medical conditions you have.  Whether you are pregnant or may be pregnant. What are the risks? Generally, this is a safe procedure. However, problems may occur, including:  Infection.  Bleeding.  Failure of the pacemaker or the lead.  Collapse of a lung or bleeding into a lung.  Blood clot inside a blood vessel with a lead.  Damage to the heart.  Infection inside the heart (endocarditis).  Allergic reactions to medicines.  What happens before the procedure? Staying hydrated Follow instructions from your health care provider about hydration, which may include:  Up to 2 hours before the procedure - you may continue to drink clear liquids, such as water, clear fruit juice, black coffee, and plain tea.  Eating and drinking restrictions Follow instructions from your health care provider about eating and drinking, which may include:  8 hours before the procedure - stop eating heavy meals or  foods such as meat, fried foods, or fatty foods.  6 hours before the procedure - stop eating light meals or foods, such as toast or cereal.  6 hours before the procedure - stop drinking milk or drinks that contain milk.  2 hours before the procedure - stop drinking clear liquids.  Medicines  Ask your  health care provider about: ? Changing or stopping your regular medicines. This is especially important if you are taking diabetes medicines or blood thinners. ? Taking medicines such as aspirin and ibuprofen. These medicines can thin your blood. Do not take these medicines before your procedure if your health care provider instructs you not to.  You may be given antibiotic medicine to help prevent infection. General instructions  You will have a heart evaluation. This may include an electrocardiogram (ECG), chest X-ray, and heart imaging (echocardiogram,  or echo) tests.  You will have blood tests.  Do not use any products that contain nicotine or tobacco, such as cigarettes and e-cigarettes. If you need help quitting, ask your health care provider.  Plan to have someone take you home from the hospital or clinic.  If you will be going home right after the procedure, plan to have someone with you for 24 hours.  Ask your health care provider how your surgical site will be marked or identified. What happens during the procedure?  To reduce your risk of infection: ? Your health care team will wash or sanitize their hands. ? Your skin will be washed with soap. ? Hair may be removed from the surgical area.  An IV tube will be inserted into one of your veins.  You will be given one or more of the following: ? A medicine to help you relax (sedative). ? A medicine to numb the area (local anesthetic). ? A medicine to make you fall asleep (general anesthetic).  If you are getting a transvenous pacemaker: ? An incision will be made in your upper chest. ? A pocket will be made for the  pacemaker. It may be placed under the skin or between layers of muscle. ? The lead will be inserted into a blood vessel that returns to the heart. ? While X-rays are taken by an imaging machine (fluoroscopy), the lead will be advanced through the vein to the inside of your heart. ? The other end of the lead will be tunneled under the skin and attached to the pacemaker.  If you are getting an epicardial pacemaker: ? An incision will be made near your ribs or breastbone (sternum) for the lead. ? The lead will be attached to the outside of your heart. ? Another incision will be made in your chest or upper belly to create a pocket for the pacemaker. ? The free end of the lead will be tunneled under the skin and attached to the pacemaker.  The transvenous or epicardial pacemaker will be tested. Imaging studies may be done to check the lead position.  The incisions will be closed with stitches (sutures), adhesive strips, or skin glue.  Bandages (dressing) will be placed over the incisions. The procedure may vary among health care providers and hospitals. What happens after the procedure?  Your blood pressure, heart rate, breathing rate, and blood oxygen level will be monitored until the medicines you were given have worn off.  You will be given antibiotics and pain medicine.  ECG and chest x-rays will be done.  You will wear a continuous type of ECG (Holter monitor) to check your heart rhythm.  Your health care provider willprogram the pacemaker.  Do not drive for 24 hours if you received a sedative. This information is not intended to replace advice given to you by your health care provider. Make sure you discuss any questions you have with your health care provider. Document Released: 03/03/2002 Document Revised: 10/01/2015 Document  Reviewed: 08/25/2015 Elsevier Interactive Patient Education  2018 [employer].      Pacemaker Implantation, Adult, Care After This sheet gives you  information about how to care for yourself after your procedure. Your health care provider may also give you more specific instructions. If you have problems or questions, contact your health care provider. What can I expect after the procedure? After the procedure, it is common to have:  Mild pain.  Slight bruising.  Some swelling over the incision.  A slight bump over the skin where the device was placed. Sometimes, it is possible to feel the device under the skin. This is normal.  Follow these instructions at home: Medicines  Take over-the-counter and prescription medicines only as told by your health care provider.  If you were prescribed an antibiotic medicine, take it as told by your health care provider. Do not stop taking the antibiotic even if you start to feel better. Wound care  Do not remove the bandage on your chest until directed to do so by your health care provider.  After your bandage is removed, you may see pieces of tape called skin adhesive strips over the area where the cut was made (incision site). Let them fall off on their own.  Check the incision site every day to make sure it is not infected, bleeding, or starting to pull apart.  Do not use lotions or ointments near the incision site unless directed to do so.  Keep the incision area clean and dry for 2-3 days after the procedure or as directed by your health care provider. It takes several weeks for the incision site to completely heal.  Do not take baths, swim, or use a hot tub for 7-10 days or as otherwise directed by your health care provider. Activity  Do not drive or use heavy machinery while taking prescription pain medicine.  Do not drive for 24 hours if you were given a medicine to help you relax (sedative).  Check with your health care provider before you start to drive or play sports.  Avoid sudden jerking, pulling, or chopping movements that pull your upper arm far away from your body. Avoid  these movements for at least 6 weeks or as long as told by your health care provider.  Do not lift your upper arm above your shoulders for at least 6 weeks or as long as told by your health care provider. This means no tennis, golf, or swimming.  You may go back to work when your health care provider says it is okay. Pacemaker care  You may be shown how to transfer data from your pacemaker through the phone to your health care provider.  Always let all health care providers know about your pacemaker before you have any medical procedures or tests.  Wear a medical ID bracelet or necklace stating that you have a pacemaker. Carry a pacemaker ID card with you at all times.  Your pacemaker battery will last for 5-15 years. Routine checks by your health care provider will let the health care provider know when the battery is starting to run down. The pacemaker will need to be replaced when the battery starts to run down.  Do not use amateur Chief of [employer]. Other electrical devices are safe to use, including power tools, lawn mowers, and speakers. If you are unsure of whether something is safe to use, ask your health care provider.  When using your cell phone, hold it to the  ear opposite the pacemaker. Do not leave your cell phone in a pocket over the pacemaker.  Avoid places or objects that have a strong electric or magnetic field, including: ? Airport Herbalist. When at the airport, let officials know that you have a pacemaker. ? Power plants. ? Large electrical generators. ? Radiofrequency transmission towers, such as cell phone and radio towers. General instructions  Weigh yourself every day. If you suddenly gain weight, fluid may be building up in your body.  Keep all follow-up visits as told by your health care provider. This is important. Contact a health care provider if:  You gain weight suddenly.  Your legs or feet swell.  It feels like your  heart is fluttering or skipping beats (heart palpitations).  You have chills or a fever.  You have more redness, swelling, or pain around your incisions.  You have more fluid or blood coming from your incisions.  Your incisions feel warm to the touch.  You have pus or a bad smell coming from your incisions. Get help right away if:  You have chest pain.  You have trouble breathing or are short of breath.  You become extremely tired.  You are light-headed or you faint. This information is not intended to replace advice given to you by your health care provider. Make sure you discuss any questions you have with your health care provider. Document Released: 09/30/2004 Document Revised: 12/24/2015 Document Reviewed: 12/24/2015 Elsevier Interactive Patient Education  2018 [place] Discharge Instructions for  Pacemaker/Defibrillator Patients  Activity No heavy lifting or vigorous activity with your left/right arm for 6 to 8 weeks.  Do not raise your left/right arm above your head for one week.  Gradually raise your affected arm as drawn below.           __  NO DRIVING for     ; you may begin driving on     .  WOUND CARE - Keep the wound area clean and dry.  Do not get this area wet for one week. No showers for one week; you may shower on     . - The tape/steri-strips on your wound will fall off; do not pull them off.  No bandage is needed on the site.  DO  NOT apply any creams, oils, or ointments to the wound area. - If you notice any drainage or discharge from the wound, any swelling or bruising at the site, or you develop a fever > 101? F after you are discharged home, call the office at once.  Special Instructions - You are still able to use cellular telephones; use the ear opposite the side where you have your pacemaker/defibrillator.  Avoid carrying your cellular phone near your device. - When traveling through airports, show security personnel  your identification card to avoid being screened in the metal detectors.  Ask the security personnel to use the hand wand. - Avoid arc welding equipment, MRI testing (magnetic resonance imaging), TENS units (transcutaneous nerve stimulators).  Call the office for questions about other devices. - Avoid electrical appliances that are in poor condition or are not properly grounded. - Microwave ovens are safe to be near or to operate.  Additional information for defibrillator patients should your device go off: - If your device goes off ONCE and you feel fine afterward, notify the device clinic nurses. - If your device goes off ONCE and you do not feel well afterward, call 911. -  If your device goes off TWICE, call 911. - If your device goes off THREE times in one day, call 911.  DO NOT DRIVE YOURSELF OR A FAMILY MEMBER WITH A DEFIBRILLATOR TO THE HOSPITAL-CALL 911.

## 2018-02-27 NOTE — Progress Notes (Signed)
Electrophysiology Office Note   Date:  02/27/2018   ID:  [name], DOB 04/22/1940, MRN [id number]  PCP:  [name], MD  Cardiologist:  [name] Primary Electrophysiologist:  Itzael Liptak Meredith Leeds, MD    No chief complaint on file.    History of Present Illness: [name] is a 77 y.o. male who is being seen today for the evaluation of atrial flutter at the request of Rosaria Ferries. Presenting today for electrophysiology evaluation.  He has a history of diastolic heart failure, COPD, diabetes, hypertension, hyperlipidemia, paroxysmal atrial fibrillation and atrial flutter status post ablation, GI bleeding not anticoagulated.  He had a catheterization 10/05/2017 with nonobstructive coronary disease.  He has had multiple emergency room visits for atrial fibrillation.  He is tolerating his 240 mg of diltiazem without major issue.  He has had a few episodes where he has tachycardia and has to take his 30 mg dose.  His main symptoms are palpitations, weakness, and fatigue.  He continues to smoke up to 4 cigarettes a day.   Today, denies symptoms of palpitations, chest pain, shortness of breath, orthopnea, PND, lower extremity edema, claudication, dizziness, presyncope, syncope, bleeding, or neurologic sequela. The patient is tolerating medications without difficulties.  Overall he is about the same.  He continues to take multiple doses of diltiazem throughout the day to control his heart rate.  He also notes his heart rate dips down to the 40s  Past Medical History:  Diagnosis Date  . Anemia    takes Ferrous Sulfate daily  . Arthritis    "all over"  . Balance problem 01/2014  . CAP (community acquired pneumonia) 09/18/2014  . Cervical radiculopathy due to degenerative joint disease of spine   . COPD (chronic obstructive pulmonary disease) ([place])   . Coronary artery disease, non-occlusive    a. 03/2010 Nonocclusive disease by cath, performed for ST elevations on ECG;  b.  06/2013 Lexi MV: EF 60%, no ischemia.  . Diabetes mellitus type II    takes Metformin and Lantus daily  . Diastolic CHF, chronic ([place])    a. 12/2012 EF 55-60%, diast dysfxn, triv MR, mildly dil LA/RA.  [name] History of blood transfusion 1982   "when I had stomach OR"  . History of bronchitis    1998  . History of gastric ulcer   . HTN (hypertension)    takes Diltiazem daily  . Hyperlipidemia    takes Pravastatin daily  . Joint pain   . PAF (paroxysmal atrial fibrillation) ([facility])    Recurrent after atrial flutter (a. 07/2010 Status post caval tricuspid isthmus ablation by Dr. Midge Aver Metoprolol daily), currently controlled on flecainide plus diltiazem and Coumadin  . Weakness    numbness and tingling both hands   Past Surgical History:  Procedure Laterality Date  . ATRIAL ABLATION SURGERY  08/05/10   CTI ablation for atrial flutter by JA  . CARDIAC CATHETERIZATION  2012   nl LV function, no occlusive CAD, PAF  . CARDIOVERSION  12/07/2010    Successful direct current cardioversion with atrial fibrillation to normal sinus rhythm  . CARPAL TUNNEL RELEASE Bilateral 01/30/2014   Procedure: BILATERAL CARPAL TUNNEL RELEASE;  Surgeon: Marianna Payment, MD;  Location: [place];  Service: Orthopedics;  Laterality: Bilateral;  . CATARACT EXTRACTION W/ INTRAOCULAR LENS  IMPLANT, BILATERAL Bilateral   . COLONOSCOPY [name] 12/02/2013   Procedure: COLONOSCOPY;  Surgeon: Irene Shipper, MD;  Location: [place];  Service: Endoscopy;  Laterality: [name];  . ESOPHAGOGASTRODUODENOSCOPY [name]  09/22/2014   Procedure: ESOPHAGOGASTRODUODENOSCOPY (EGD);  Surgeon: Ronald Lobo, MD;  Location[facility] ENDOSCOPY;  Service: Endoscopy;  Laterality: [name];  . ESOPHAGOGASTRODUODENOSCOPY [name] 07/11/2016   Procedure: ESOPHAGOGASTRODUODENOSCOPY (EGD);  Surgeon: Doran Stabler, MD;  Location: [facility] ENDOSCOPY;  Service: Endoscopy;  Laterality: [name];  . INCISION AND DRAINAGE ABSCESS / HEMATOMA OF BURSA / KNEE / THIGH Left 1998   knee  .  KNEE BURSECTOMY Left 1998  . LAPAROSCOPIC CHOLECYSTECTOMY  03/2010  . LEFT HEART CATH AND CORONARY ANGIOGRAPHY [name] 10/05/2017   Procedure: LEFT HEART CATH AND CORONARY ANGIOGRAPHY;  Surgeon: Martinique, Peter M, MD;  Location: [place] CV LAB;  Service: Cardiovascular;  Laterality: [name];  . NM MYOVIEW LTD  07/22/2013   Normal EF ~60%, no ischemia or infarction.  [name] PARTIAL GASTRECTOMY  1982   subtotal; "took out 30% for ulcers"  . TRANSTHORACIC ECHOCARDIOGRAM  02/16/2014   EF 60%, no RWMA. - otherwise normal  . YAG LASER APPLICATION Bilateral      Current Outpatient Medications  Medication Sig Dispense Refill  . albuterol (PROVENTIL HFA;VENTOLIN HFA) 108 (90 Base) MCG/ACT inhaler Inhale 2 puffs into the lungs every 4 (four) hours as needed for wheezing or shortness of breath. 1 Inhaler 0  . aspirin EC 81 MG tablet Take 81 mg by mouth daily.    [name] co-enzyme Q-10 30 MG capsule Take 30 mg by mouth daily.     . diclofenac sodium (VOLTAREN) 1 % GEL Apply 4 g topically 3 (three) times daily as needed (for back pain). 1 Tube 0  . diltiazem (CARDIZEM CD) 300 MG 24 hr capsule Take 1 capsule (300 mg total) by mouth daily. 30 capsule 6  . diltiazem (CARDIZEM) 30 MG tablet Take 1 tablet (30 mg total) by mouth 4 (four) times daily as needed (for afib). For afib 30 tablet 1  . ferrous sulfate 325 (65 FE) MG tablet Take 1 tablet by mouth 4 (four) times a week. Takes M/T/W/F    . glucose blood (PRECISION QID TEST) test strip Check glucose twice daily    . hydrocortisone cream 1 % Apply to affected area 2 times daily 15 g 0  . Melatonin 3 MG TABS Take 1 tablet (3 mg total) by mouth at bedtime. 30 tablet 0  . metFORMIN (GLUCOPHAGE) 1000 MG tablet Take 1,000 mg by mouth 2 (two) times daily with a meal.     . Multiple Vitamin (MULTIVITAMIN WITH MINERALS) TABS tablet Take 1 tablet by mouth 2 (two) times daily.     . nitroGLYCERIN (NITROSTAT) 0.4 MG SL tablet Place 1 tablet (0.4 mg total) under the tongue every 5  (five) minutes x 3 doses as needed for chest pain. 25 tablet 9  . nystatin cream (MYCOSTATIN) Apply to affected area 2 times daily 30 g 0  . pantoprazole (PROTONIX) 40 MG tablet Take 1 tablet (40 mg total) by mouth 2 (two) times daily before a meal.    . polyethylene glycol (MIRALAX / GLYCOLAX) packet Take 17 g by mouth as needed for moderate constipation. 10 each 0  . pravastatin (PRAVACHOL) 40 MG tablet Take 1 tablet (40 mg total) by mouth every evening. 90 tablet 3  . tamsulosin (FLOMAX) 0.4 MG CAPS capsule Take 0.4 mg by mouth daily.    . vitamin C (ASCORBIC ACID) 250 MG tablet Take 1 tablet (250 mg total) by mouth daily. 30 tablet 2   No current facility-administered medications for this visit.     Allergies:  Cyclobenzaprine   Social History:  The patient  reports that he has been smoking cigarettes. He has a 28.50 pack-year smoking history. He has never used smokeless tobacco. He reports that he does not drink alcohol or use drugs.   Family History:  The patient's family history includes Breast cancer in his mother; Heart attack in his brother and father.   ROS:  Please see the history of present illness.   Otherwise, review of systems is positive for stations, cough, shortness of breath, back problems.   All other systems are reviewed and negative.   PHYSICAL EXAM: VS:  BP 126/78   Pulse (!) 108   Ht 5\' 6"  (1.676 m)   Wt 167 lb 6.4 oz (75.9 kg)   SpO2 97%   BMI 27.02 kg/m  , BMI Body mass index is 27.02 kg/m. GEN: Well nourished, well developed, in no acute distress  HEENT: normal  Neck: no JVD, carotid bruits, or masses Cardiac: Regular, tachycardic; no murmurs, rubs, or gallops,no edema  Respiratory:  clear to auscultation bilaterally, normal work of breathing GI: soft, nontender, nondistended, + BS MS: no deformity or atrophy  Skin: warm and dry Neuro:  Strength and sensation are intact Psych: euthymic mood, full affect  EKG:  EKG is ordered today. Personal review  of the ekg ordered shows sinus tachycardia, rate 108  Recent Labs: 11/08/2017: B Natriuretic Peptide 267.5 12/11/2017: TSH 5.100 01/14/2018: Magnesium 1.6 02/13/2018: ALT 32; BUN 14; Creatinine 1.00; Hemoglobin 12.8; Platelet Count 267; Potassium 4.3; Sodium 139    Lipid Panel     Component Value Date/Time   CHOL 82 11/09/2017 0040   TRIG 136 11/09/2017 0040   HDL 38 (L) 11/09/2017 0040   CHOLHDL 2.2 11/09/2017 0040   VLDL 27 11/09/2017 0040   LDLCALC 17 11/09/2017 0040     Wt Readings from Last 3 Encounters:  02/27/18 167 lb 6.4 oz (75.9 kg)  02/19/18 174 lb (78.9 kg)  01/22/18 170 lb 9.6 oz (77.4 kg)      Other studies Reviewed: Additional studies/ records that were reviewed today include: LHC 10/05/17  Review of the above records today demonstrates:   Dist RCA lesion is 40% stenosed.  Ost 1st Sept lesion is 95% stenosed.  LV end diastolic pressure is normal.   1. Single vessel CAD involving the ostium of the first septal perforator. Otherwise nonobstructive disease. 2. Normal LVEDP  TTE 10/05/17 - Left ventricle: The cavity size was normal. Wall thickness was   increased in a pattern of mild LVH. Systolic function was normal.   The estimated ejection fraction was in the range of 55% to 60%. - Aortic valve: There was trivial regurgitation. - Mitral valve: There was mild regurgitation. - Left atrium: The atrium was mildly dilated.  ASSESSMENT AND PLAN:  1.  Atypical atrial flutter/atrial fibrillation: Has recurrent GI bleeds and thus cannot be anticoagulated.  Thus planning a rhythm control strategy.  Has had quite a few tachycardic episodes.  He is also had some bradycardia.  We Maitland Lesiak plan to implant a pacemaker and medications once a pacemaker has been implanted.  She continued to have episodes of tachycardia, Candee Hoon plan for AV node ablation.  Risks and benefits were discussed.  Risks include bleeding, tamponade, infection, pneumothorax.  He understands these risks and  is agreed to the procedure.    2.  Chronic diastolic heart failure: No signs of volume overload   3.  Tobacco abuse: Cessation encouraged   Current medicines are reviewed  at length with the patient today.   The patient does not have concerns regarding his medicines.  The following changes were made today: None  Labs/ tests ordered today include:  Orders Placed This Encounter  Procedures  . Basic metabolic panel  . CBC  . EKG 12-Lead     Disposition:   FU with Analya Louissaint 3 months  Signed, Jaimie Redditt Meredith Leeds, MD  02/27/2018 1:55 PM     [place] [id number] [phone] (office) [phone] (fax)

## 2018-02-28 LAB — CBC
Hematocrit: 35.7 % — ABNORMAL LOW (ref 37.5–51.0)
Hemoglobin: 11.5 g/dL — ABNORMAL LOW (ref 13.0–17.7)
MCH: 29.2 pg (ref 26.6–33.0)
MCHC: 32.2 g/dL (ref 31.5–35.7)
MCV: 91 fL (ref 79–97)
Platelets: 311 10*3/uL (ref 150–450)
RBC: 3.94 x10E6/uL — ABNORMAL LOW (ref 4.14–5.80)
RDW: 17.3 % — ABNORMAL HIGH (ref 12.3–15.4)
WBC: 5.5 10*3/uL (ref 3.4–10.8)

## 2018-02-28 LAB — BASIC METABOLIC PANEL
BUN / CREAT RATIO: 13 (ref 10–24)
BUN: 11 mg/dL (ref 8–27)
CO2: 23 mmol/L (ref 20–29)
Calcium: 9.5 mg/dL (ref 8.6–10.2)
Chloride: 101 mmol/L (ref 96–106)
Creatinine, Ser: 0.87 mg/dL (ref 0.76–1.27)
GFR calc Af Amer: 96 mL/min/{1.73_m2} (ref >59)
GFR calc non Af Amer: 83 mL/min/{1.73_m2} (ref >59)
Glucose: 115 mg/dL — ABNORMAL HIGH (ref 65–99)
Potassium: 4 mmol/L (ref 3.5–5.2)
Sodium: 140 mmol/L (ref 134–144)

## 2018-03-01 ENCOUNTER — Telehealth: Payer: Self-pay | Admitting: Cardiology

## 2018-03-01 NOTE — Telephone Encounter (Signed)
  [name] is calling because he wants to cancel surgery scheduled for 03/06/18 with Dr Curt Bears

## 2018-03-01 NOTE — Telephone Encounter (Signed)
Spoke to patient who is calling to cancel his Pacemaker with Dr Curt Bears on 12/11.  He is confused about the after care and wants Sherri to call his daughter [name] [phone]) to further explain on Monday 12/9.

## 2018-03-01 NOTE — Telephone Encounter (Signed)
lpmtcb 12/6

## 2018-03-04 ENCOUNTER — Telehealth: Payer: Self-pay | Admitting: *Deleted

## 2018-03-04 NOTE — Telephone Encounter (Signed)
-----   Message from Will Meredith Leeds, MD sent at 03/04/2018 10:40 AM EST ----- Stable preop labs

## 2018-03-04 NOTE — Telephone Encounter (Signed)
° °  Spoke with patient, states he is NOT having procedure on 12/11.

## 2018-03-04 NOTE — Telephone Encounter (Signed)
Spoke to pt and his caregiver, [name].   Upon further discussion/thought pt does NOT want to continue w/ PPM implant at this time.  He also does not wish to go to rehab for extended period of time post implant.  He is worried about several things he can't do after implant and caretaker issues.   Discussed importance/benefit of PPM and rationale for initial suggestion of needing a pacemaker.  Pt still would prefer to wait for now and monitor. Pt will call the office if issues arise again. He understands I will let him know recommended follow up once discussed w/ Cam[place]. Procedure cancelled and wound check cancelled.

## 2018-03-04 NOTE — Telephone Encounter (Signed)
I called the pt and went over lab results by phone with verbal understanding. Pt states to me that he has been loosing weight every day. Pt states his weight today was 143 lb. Pt's last weight 02/27/18 when he saw Dr. Curt Bears his weight was 167 lb. Pt's weight 11/26 when he saw Dr. Curt Bears was 174 lb. Pt states he is very concerned he is loosing weight every day. Pt advised he needs to f/u with PCP ASAP. I did ask pt was he sure his scale was accurate. Pt answered yes, scale is accurate.

## 2018-03-06 ENCOUNTER — Encounter (HOSPITAL_COMMUNITY): Admission: RE | Payer: Self-pay | Source: Home / Self Care

## 2018-03-06 ENCOUNTER — Ambulatory Visit (HOSPITAL_COMMUNITY): Admission: RE | Admit: 2018-03-06 | Payer: Medicare Other | Source: Home / Self Care | Admitting: Cardiology

## 2018-03-06 SURGERY — PACEMAKER IMPLANT

## 2018-03-08 ENCOUNTER — Ambulatory Visit: Payer: Medicare Other | Admitting: Cardiology

## 2018-03-14 NOTE — Telephone Encounter (Signed)
Pt aware we will call him to schedule follow up Feb/March. He is agreeable to plan.

## 2018-03-18 ENCOUNTER — Ambulatory Visit: Payer: Medicare Other

## 2018-03-18 IMAGING — CR DG ANKLE COMPLETE 3+V*R*
3 series · 3 of 3 positions shown · non-contrast
Comparison: None.

CLINICAL DATA: Nontraumatic right ankle pain for 2 weeks, worse
today.

EXAM:
RIGHT ANKLE - COMPLETE 3+ VIEW

[x ankle ap right]
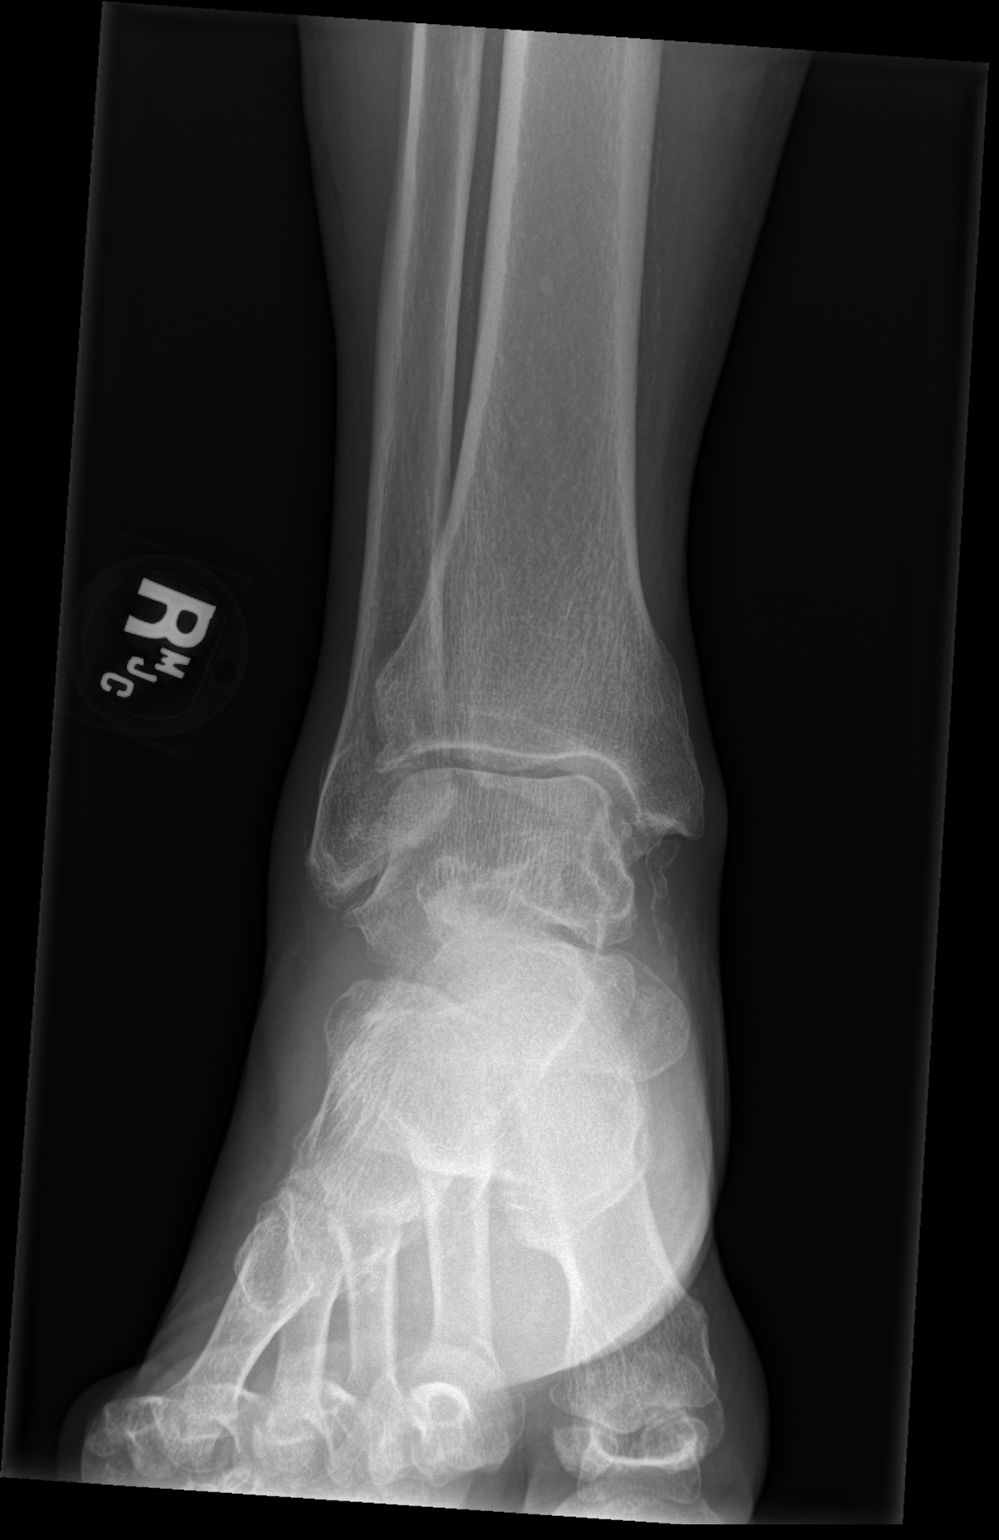

[x ankle obl right]
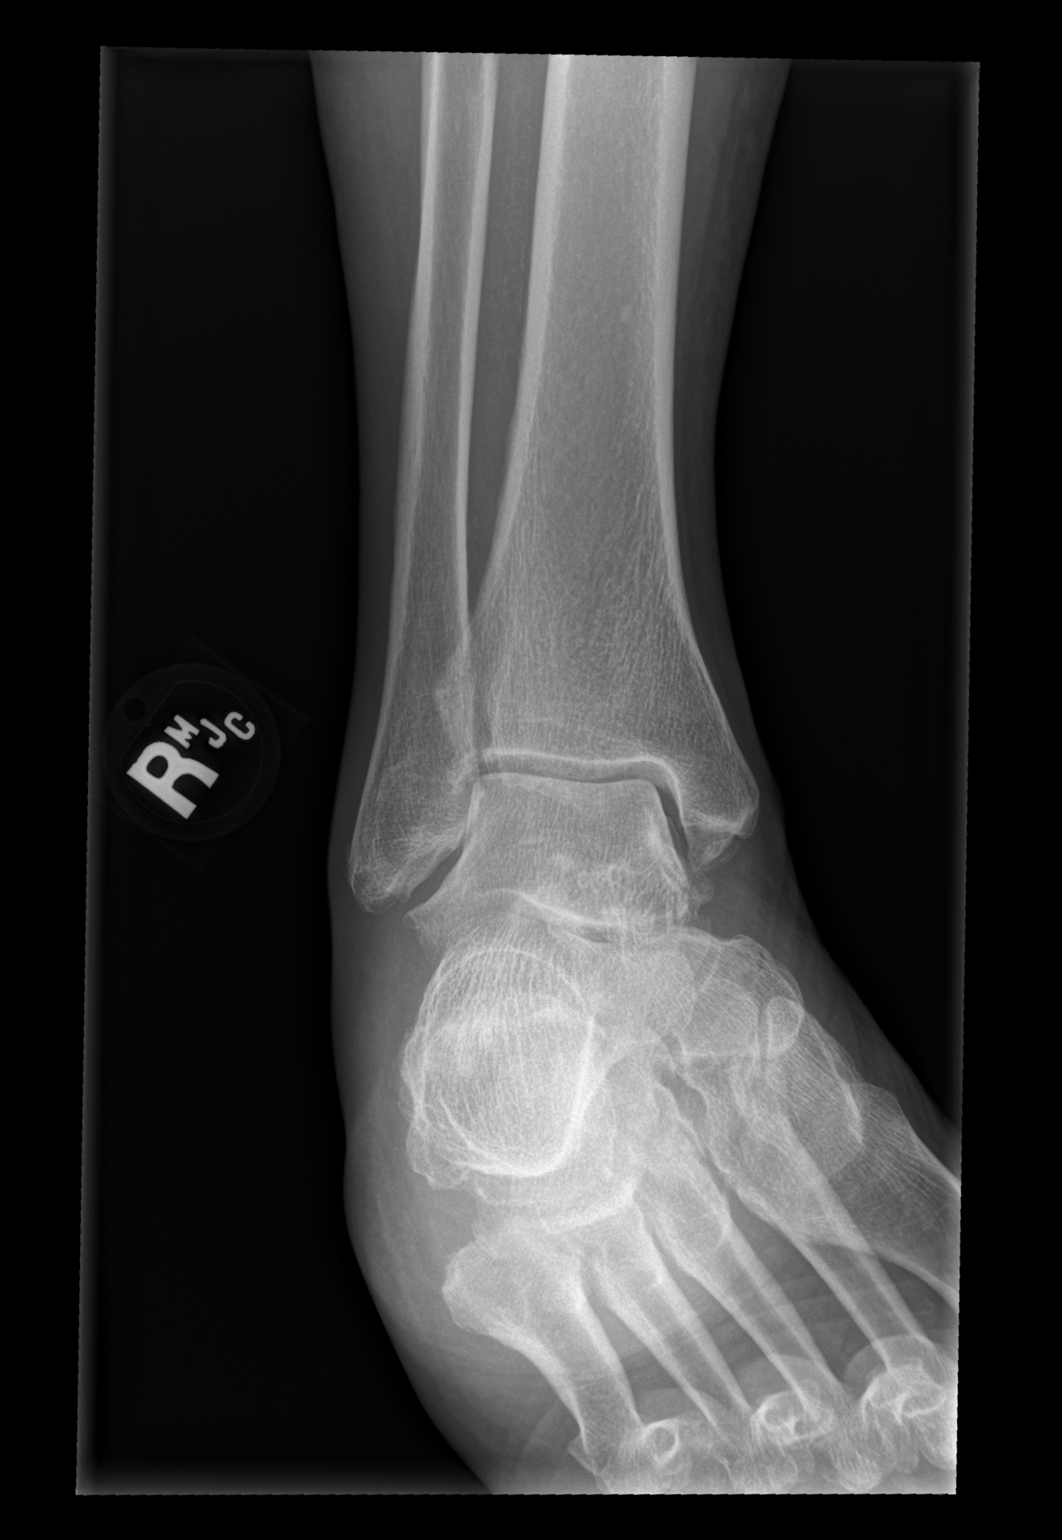

[x ankle lat right]
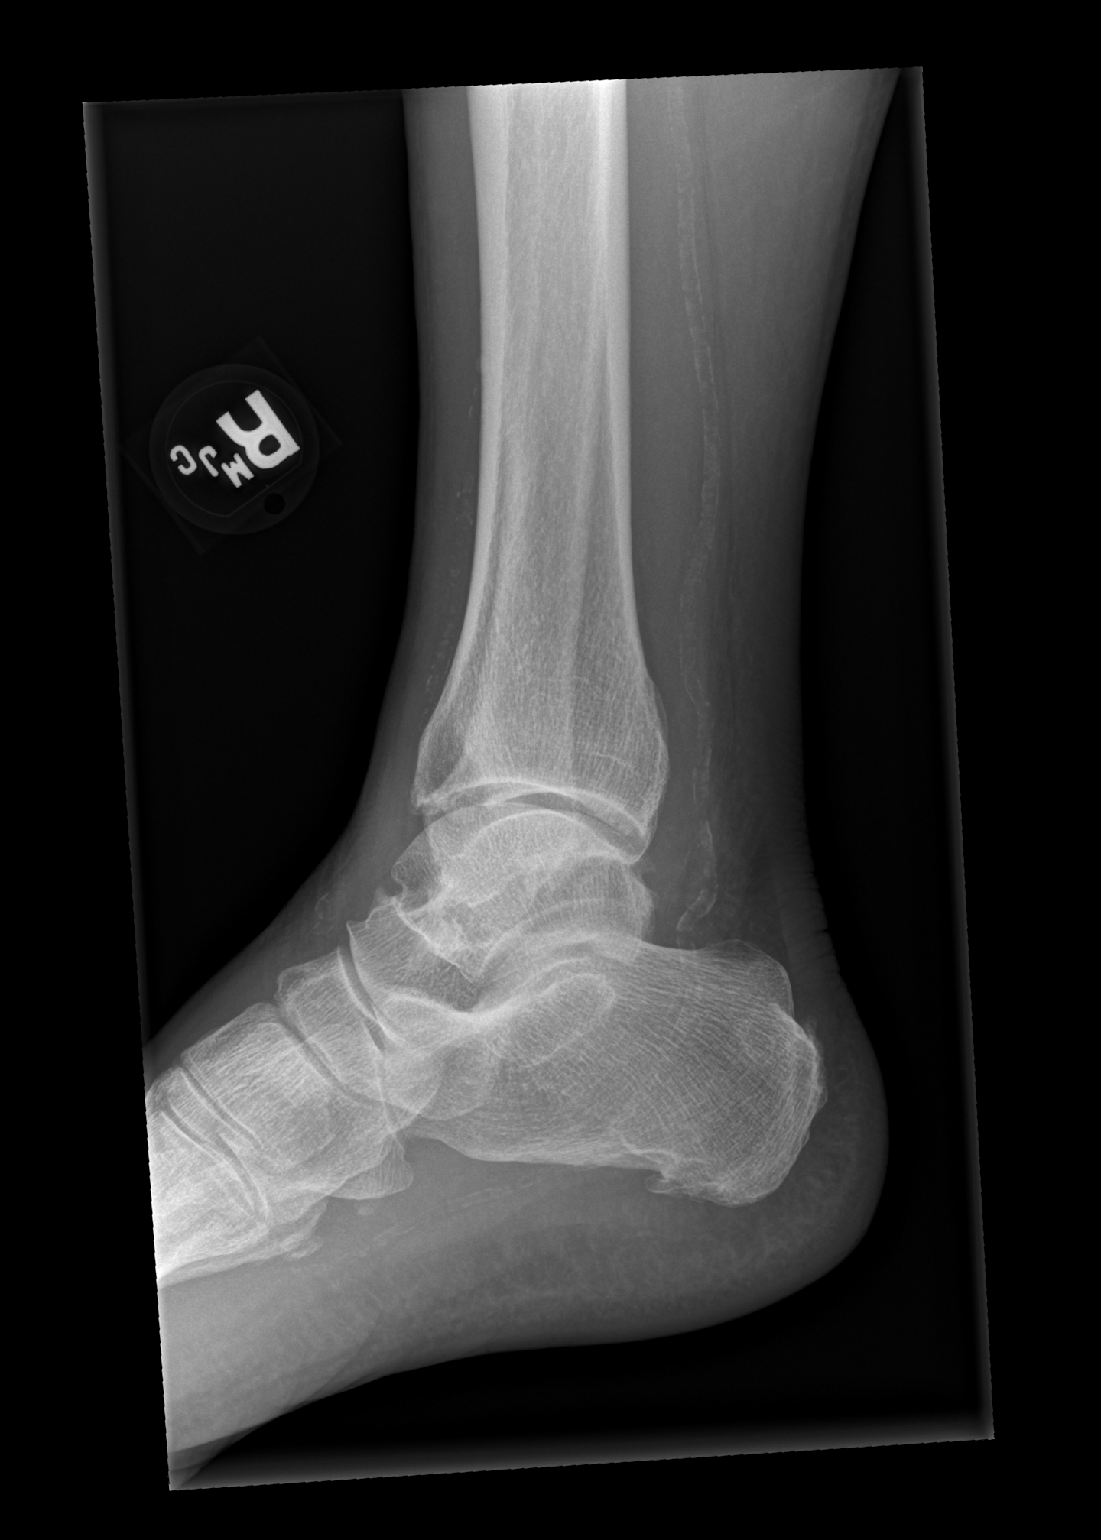

[3 of 3 positions shown; findings below may reference images not displayed]

FINDINGS: Negative for acute fracture or dislocation. Moderate degenerate
irregularities are present at the tibiotalar articulation. There is
irregularity of the talar neck but this does not have the appearance
of a 2-week-old fracture. It may reflect residua from remote trauma.
IMPRESSION: Talar neck irregularity which could represent residua from remote
trauma but this does not have the appearance of a recent fracture.
There also are moderate tibiotalar degenerative changes. If
additional imaging is clinically warranted, CT would provide better
evaluation of the talar abnormality.

## 2018-03-21 ENCOUNTER — Telehealth: Payer: Self-pay | Admitting: Cardiology

## 2018-03-21 NOTE — Telephone Encounter (Signed)
Called patient. He is not sure who call. Patient stated he needed to schedule an appt or procedure note sure. Patient stated his daughter would need to be called to schedule. Will send to Dr. Macky Lower nurse to see if she has called patient.

## 2018-03-21 NOTE — Telephone Encounter (Signed)
New Message   Pt is returning call from the 24th no notes show what the call was about. I do see Sherri called the pat on the 19th. Pleas

## 2018-03-22 NOTE — Telephone Encounter (Signed)
Left detailed message that our office would contact them to arrange follow up with Dr. Curt Bears for next year.  Explained that I had not called him and that it may have been a scheduler calling. Asked to call office back if he had further concerns/questions.

## 2018-03-23 ENCOUNTER — Telehealth: Payer: Self-pay | Admitting: Cardiology

## 2018-03-23 NOTE — Telephone Encounter (Signed)
I returned a request to call the patient. He reports that he has had SBP 106-109 over the past several hours. He denies lightheadedness or dizziness or syncope. He denies chest pain or dyspnea. He had some transient dyspnea this morning. He reports that he has had pain in his posterior shoulder for several days.  He was continued to monitor his BP and symptoms. He was told should come to the ED if he develops any symptoms.

## 2018-03-28 ENCOUNTER — Other Ambulatory Visit: Payer: Medicare Other

## 2018-03-28 ENCOUNTER — Ambulatory Visit: Payer: Medicare Other | Admitting: Family

## 2018-03-29 ENCOUNTER — Other Ambulatory Visit: Payer: Self-pay | Admitting: Cardiology

## 2018-03-30 ENCOUNTER — Encounter (HOSPITAL_COMMUNITY): Payer: Self-pay

## 2018-03-30 ENCOUNTER — Other Ambulatory Visit: Payer: Self-pay

## 2018-03-30 DIAGNOSIS — R300 Dysuria: Secondary | ICD-10-CM | POA: Diagnosis present

## 2018-03-30 DIAGNOSIS — Z7982 Long term (current) use of aspirin: Secondary | ICD-10-CM | POA: Insufficient documentation

## 2018-03-30 DIAGNOSIS — Z7984 Long term (current) use of oral hypoglycemic drugs: Secondary | ICD-10-CM | POA: Insufficient documentation

## 2018-03-30 DIAGNOSIS — I5032 Chronic diastolic (congestive) heart failure: Secondary | ICD-10-CM | POA: Insufficient documentation

## 2018-03-30 DIAGNOSIS — E119 Type 2 diabetes mellitus without complications: Secondary | ICD-10-CM | POA: Insufficient documentation

## 2018-03-30 DIAGNOSIS — Z79899 Other long term (current) drug therapy: Secondary | ICD-10-CM | POA: Diagnosis not present

## 2018-03-30 DIAGNOSIS — N481 Balanitis: Secondary | ICD-10-CM | POA: Insufficient documentation

## 2018-03-30 DIAGNOSIS — I110 Hypertensive heart disease with heart failure: Secondary | ICD-10-CM | POA: Insufficient documentation

## 2018-03-30 DIAGNOSIS — R21 Rash and other nonspecific skin eruption: Secondary | ICD-10-CM | POA: Diagnosis not present

## 2018-03-30 DIAGNOSIS — F17210 Nicotine dependence, cigarettes, uncomplicated: Secondary | ICD-10-CM | POA: Insufficient documentation

## 2018-03-30 NOTE — ED Triage Notes (Signed)
States for about 2 months and has burning in tip of penis just took antibiotics for same.

## 2018-03-31 ENCOUNTER — Emergency Department (HOSPITAL_COMMUNITY)
Admission: EM | Admit: 2018-03-31 | Discharge: 2018-03-31 | Disposition: A | Discharge status: Home / Self Care | Payer: Medicare Other | Attending: Emergency Medicine | Admitting: Emergency Medicine

## 2018-03-31 DIAGNOSIS — N481 Balanitis: Secondary | ICD-10-CM

## 2018-03-31 LAB — CBC WITH DIFFERENTIAL/PLATELET
Abs Immature Granulocytes: 0.01 10*3/uL (ref 0.00–0.07)
Basophils Absolute: 0 10*3/uL (ref 0.0–0.1)
Basophils Relative: 1 %
Eosinophils Absolute: 0.1 10*3/uL (ref 0.0–0.5)
Eosinophils Relative: 2 %
HCT: 33.2 % — ABNORMAL LOW (ref 39.0–52.0)
HEMOGLOBIN: 10.1 g/dL — AB (ref 13.0–17.0)
Immature Granulocytes: 0 %
LYMPHS PCT: 12 %
Lymphs Abs: 0.7 10*3/uL (ref 0.7–4.0)
MCH: 28.2 pg (ref 26.0–34.0)
MCHC: 30.4 g/dL (ref 30.0–36.0)
MCV: 92.7 fL (ref 80.0–100.0)
Monocytes Absolute: 0.7 10*3/uL (ref 0.1–1.0)
Monocytes Relative: 12 %
Neutro Abs: 4.1 10*3/uL (ref 1.7–7.7)
Neutrophils Relative %: 73 %
Platelets: 263 10*3/uL (ref 150–400)
RBC: 3.58 MIL/uL — ABNORMAL LOW (ref 4.22–5.81)
RDW: 15.4 % (ref 11.5–15.5)
WBC: 5.6 10*3/uL (ref 4.0–10.5)
nRBC: 0 % (ref 0.0–0.2)

## 2018-03-31 LAB — COMPREHENSIVE METABOLIC PANEL
ALK PHOS: 67 U/L (ref 38–126)
ALT: 21 U/L (ref 0–44)
AST: 20 U/L (ref 15–41)
Albumin: 3.5 g/dL (ref 3.5–5.0)
Anion gap: 11 (ref 5–15)
BILIRUBIN TOTAL: 0.3 mg/dL (ref 0.3–1.2)
BUN: 15 mg/dL (ref 8–23)
CO2: 25 mmol/L (ref 22–32)
Calcium: 8.8 mg/dL — ABNORMAL LOW (ref 8.9–10.3)
Chloride: 104 mmol/L (ref 98–111)
Creatinine, Ser: 0.91 mg/dL (ref 0.61–1.24)
GFR calc Af Amer: >60 mL/min (ref >60)
GFR calc non Af Amer: >60 mL/min (ref >60)
Glucose, Bld: 131 mg/dL — ABNORMAL HIGH (ref 70–99)
Potassium: 3.4 mmol/L — ABNORMAL LOW (ref 3.5–5.1)
Sodium: 140 mmol/L (ref 135–145)
TOTAL PROTEIN: 6.1 g/dL — AB (ref 6.5–8.1)

## 2018-03-31 LAB — URINALYSIS, ROUTINE W REFLEX MICROSCOPIC
Bacteria, UA: NONE SEEN
Bilirubin Urine: NEGATIVE
GLUCOSE, UA: NEGATIVE mg/dL
Hgb urine dipstick: NEGATIVE
Ketones, ur: NEGATIVE mg/dL
Nitrite: NEGATIVE
Protein, ur: NEGATIVE mg/dL
Specific Gravity, Urine: 1.024 (ref 1.005–1.030)
pH: 5 (ref 5.0–8.0)

## 2018-03-31 MED ORDER — CLOTRIMAZOLE 1 % EX CREA: TOPICAL_CREAM | CUTANEOUS | 0 refills | Status: DC

## 2018-03-31 MED ORDER — METRONIDAZOLE 500 MG PO TABS: 500.0000 mg | ORAL_TABLET | Freq: Two times a day (BID) | ORAL | 0 refills | Status: DC

## 2018-03-31 MED ORDER — STERILE WATER FOR INJECTION IJ SOLN
INTRAMUSCULAR | Status: AC
  Administered 2018-03-31: 10 mL
  Filled 2018-03-31: qty 10

## 2018-03-31 MED ORDER — FLUCONAZOLE 150 MG PO TABS
150.0000 mg | ORAL_TABLET | Freq: Once | ORAL | Status: AC
  Administered 2018-03-31: 150 mg via ORAL
  Filled 2018-03-31: qty 1

## 2018-03-31 MED ORDER — CEFTRIAXONE SODIUM 250 MG IJ SOLR
250.0000 mg | Freq: Once | INTRAMUSCULAR | Status: AC
  Administered 2018-03-31: 250 mg via INTRAMUSCULAR
  Filled 2018-03-31: qty 250

## 2018-03-31 NOTE — ED Notes (Signed)
Signature pad malfunction, permission given to this RN to sign with mouse.

## 2018-03-31 NOTE — Discharge Instructions (Signed)
Use the antifungal cream as prescribed.  Take the antibiotics by mouth and keep a close watch on her blood sugars.  Follow-up with your primary doctor as well as the urologist for further evaluation of this infection.  Return to the ED with fever, chills, vomiting, any other concerns.

## 2018-03-31 NOTE — ED Provider Notes (Signed)
[facility] DEPT Provider Note   CSN: [id number] Arrival date & time: 03/30/18  2143     History   Chief Complaint Chief Complaint  Patient presents with  . Dysuria    burning in tip of penis    HPI [name] is a 78 y.o. male.  Patient with history of atrial fibrillation, diabetes, COPD, diastolic CHF presenting with a 6 to 7-week history of irritation to his penis, dysuria, pain with urination and penile discharge.  States he has been treated by his PCP and just completed a course of fluconazole about 2 weeks ago.  He comes in tonight with worsening pain and dysuria.  Denies fevers, chills, nausea or vomiting.  No testicular pain.  No abdominal pain.  States he is not sexually active since his wife died in the 80s.  His sugars have been well controlled.  No chest pain or shortness of breath.  The history is provided by the patient.  Dysuria   Pertinent negatives include no nausea, no vomiting, no frequency and no flank pain.    Past Medical History:  Diagnosis Date  . Anemia    takes Ferrous Sulfate daily  . Arthritis    "all over"  . Balance problem 01/2014  . CAP (community acquired pneumonia) 09/18/2014  . Cervical radiculopathy due to degenerative joint disease of spine   . COPD (chronic obstructive pulmonary disease) ([place])   . Coronary artery disease, non-occlusive    a. 03/2010 Nonocclusive disease by cath, performed for ST elevations on ECG;  b. 06/2013 Lexi MV: EF 60%, no ischemia.  . Diabetes mellitus type II    takes Metformin and Lantus daily  . Diastolic CHF, chronic ([place])    a. 12/2012 EF 55-60%, diast dysfxn, triv MR, mildly dil LA/RA.  [name] History of blood transfusion 1982   "when I had stomach OR"  . History of bronchitis    1998  . History of gastric ulcer   . HTN (hypertension)    takes Diltiazem daily  . Hyperlipidemia    takes Pravastatin daily  . Joint pain   . PAF (paroxysmal atrial fibrillation) (HCC)    Recurrent after atrial flutter (a. 07/2010 Status post caval tricuspid isthmus ablation by Dr. Midge Aver Metoprolol daily), currently controlled on flecainide plus diltiazem and Coumadin  . Weakness    numbness and tingling both hands    Patient Active Problem List   Diagnosis Date Noted  . Rapid atrial fibrillation ([place]) 01/18/2018  . Atrial fibrillation with RVR ([place]) 11/08/2017  . Chest pain with moderate risk of acute coronary syndrome 10/04/2017  . HTN (hypertension) 05/21/2017  . Diabetes mellitus type II 05/21/2017  . Diastolic CHF, chronic ([place]) 05/21/2017  . COPD (chronic obstructive pulmonary disease) ([place]) 05/21/2017  . H/O atrial flutter 05/21/2017  . Coronary disease/nonobstructive 05/21/2017  . Acute blood loss anemia 10/02/2016  . Abdominal pain 10/02/2016  . Constipation 10/02/2016  . Left lower quadrant pain   . Coagulopathy ([place])   . Heme positive stool   . Symptomatic anemia 07/10/2016  . Syncope 05/15/2015  . Cervical disc disease 05/15/2015  . Abnormal urinalysis 05/15/2015  . Anemia 05/15/2015  . History of CVA (cerebrovascular accident) 04/03/2014  . Near syncope 02/15/2014  . Pre-syncope 02/15/2014  . Benign neoplasm of rectum and anal canal 12/02/2013  . Upper GI bleeding 11/30/2013  . Tobacco use disorder 11/30/2013  . Anticoagulation goal of INR 2 to 3, for PAF - CHA2DS2Vasc = 7 08/15/2013  .  Type 2 diabetes mellitus ([place]) 01/22/2013  . Tobacco abuse counseling 12/15/2012  . Hyperlipidemia   . Obesity (BMI 30-39.9) 12/14/2012  . Persistent atrial fibrillation ([place]) 10/07/2010  . Atrial flutter - status post CTI ablation 07/29/2010  . Essential hypertension 07/29/2010  . Chronic diastolic congestive heart failure ([place]) 07/29/2010  . Coronary artery disease, non-occlusive 07/29/2010    Past Surgical History:  Procedure Laterality Date  . ATRIAL ABLATION SURGERY  08/05/10   CTI ablation for atrial flutter by JA  . CARDIAC CATHETERIZATION  2012    nl LV function, no occlusive CAD, PAF  . CARDIOVERSION  12/07/2010    Successful direct current cardioversion with atrial fibrillation to normal sinus rhythm  . CARPAL TUNNEL RELEASE Bilateral 01/30/2014   Procedure: BILATERAL CARPAL TUNNEL RELEASE;  Surgeon: Marianna Payment, MD;  Location: [facility];  Service: Orthopedics;  Laterality: Bilateral;  . CATARACT EXTRACTION W/ INTRAOCULAR LENS  IMPLANT, BILATERAL Bilateral   . COLONOSCOPY [name] 12/02/2013   Procedure: COLONOSCOPY;  Surgeon: Irene Shipper, MD;  Location: [facility];  Service: Endoscopy;  Laterality: [name];  . ESOPHAGOGASTRODUODENOSCOPY [name] 09/22/2014   Procedure: ESOPHAGOGASTRODUODENOSCOPY (EGD);  Surgeon: Ronald Lobo, MD;  Location[facility] ENDOSCOPY;  Service: Endoscopy;  Laterality: [name];  . ESOPHAGOGASTRODUODENOSCOPY [name] 07/11/2016   Procedure: ESOPHAGOGASTRODUODENOSCOPY (EGD);  Surgeon: Doran Stabler, MD;  Location[facility] ENDOSCOPY;  Service: Endoscopy;  Laterality: [name];  . INCISION AND DRAINAGE ABSCESS / HEMATOMA OF BURSA / KNEE / THIGH Left 1998   knee  . KNEE BURSECTOMY Left 1998  . LAPAROSCOPIC CHOLECYSTECTOMY  03/2010  . LEFT HEART CATH AND CORONARY ANGIOGRAPHY [name] 10/05/2017   Procedure: LEFT HEART CATH AND CORONARY ANGIOGRAPHY;  Surgeon: Martinique, Peter M, MD;  Location: [facility] CV LAB;  Service: Cardiovascular;  Laterality: [name];  . NM MYOVIEW LTD  07/22/2013   Normal EF ~60%, no ischemia or infarction.  [name] PARTIAL GASTRECTOMY  1982   subtotal; "took out 30% for ulcers"  . TRANSTHORACIC ECHOCARDIOGRAM  02/16/2014   EF 60%, no RWMA. - otherwise normal  . YAG LASER APPLICATION Bilateral         Home Medications    Prior to Admission medications   Medication Sig Start Date End Date Taking? Authorizing Provider  albuterol (PROVENTIL HFA;VENTOLIN HFA) 108 (90 Base) MCG/ACT inhaler Inhale 2 puffs into the lungs every 4 (four) hours as needed for wheezing or shortness of breath. 10/30/17   Palumbo, April, MD  aspirin EC 81 MG  tablet Take 81 mg by mouth daily.    [provider]  co-enzyme Q-10 30 MG capsule Take 30 mg by mouth daily.     [provider]  diclofenac sodium (VOLTAREN) 1 % GEL Apply 4 g topically 3 (three) times daily as needed (for back pain). 11/10/17   Rory Percy, DO  diltiazem (CARDIZEM CD) 300 MG 24 hr capsule Take 1 capsule (300 mg total) by mouth daily. 02/19/18   Camnitz, Will Hassell Done, MD  diltiazem (CARDIZEM) 30 MG tablet TAKE 1 TABLET BY MOUTH 4 TIMES DAILY AS NEEDED FOR AFIB 03/29/18   Camnitz, Ocie Doyne, MD  ferrous sulfate 325 (65 FE) MG tablet Take 1 tablet by mouth 4 (four) times a week. Takes M/T/W/F 02/14/18   [provider]  glucose blood (PRECISION QID TEST) test strip Check glucose twice daily 10/24/13   [provider]  hydrocortisone cream 1 % Apply to affected area 2 times daily 12/24/17   Maczis, Barth Kirks, PA-C  Melatonin  3 MG TABS Take 1 tablet (3 mg total) by mouth at bedtime. 11/16/17   Anderson, Chelsey L, DO  metFORMIN (GLUCOPHAGE) 1000 MG tablet Take 1,000 mg by mouth 2 (two) times daily with a meal.     [provider]  Multiple Vitamin (MULTIVITAMIN WITH MINERALS) TABS tablet Take 1 tablet by mouth 2 (two) times daily.     [provider]  nitroGLYCERIN (NITROSTAT) 0.4 MG SL tablet Place 1 tablet (0.4 mg total) under the tongue every 5 (five) minutes x 3 doses as needed for chest pain. 11/19/15   Erlene Quan, PA-C  nystatin cream (MYCOSTATIN) Apply to affected area 2 times daily 01/02/18   Deno Etienne, DO  pantoprazole (PROTONIX) 40 MG tablet Take 1 tablet (40 mg total) by mouth 2 (two) times daily before a meal. 11/30/16   Domenic Polite, MD  polyethylene glycol [facility] / Floria Raveling) packet Take 17 g by mouth as needed for moderate constipation. 11/10/17   Rory Percy, DO  pravastatin (PRAVACHOL) 40 MG tablet Take 1 tablet (40 mg total) by mouth every evening. 05/04/17   Leonie Man, MD  tamsulosin (FLOMAX) 0.4 MG  CAPS capsule Take 0.4 mg by mouth daily. 02/20/17   [provider]  vitamin C (ASCORBIC ACID) 250 MG tablet Take 1 tablet (250 mg total) by mouth daily. 01/20/18   Rise Mu, PA-C    Family History Family History  Problem Relation Age of Onset  . Breast cancer Mother   . Heart attack Father   . Heart attack Brother     Social History Social History   Tobacco Use  . Smoking status: Current Every Day Smoker    Packs/day: 0.50    Years: 57.00    Pack years: 28.50    Types: Cigarettes  . Smokeless tobacco: Never Used  . Tobacco comment: He's been smoking between 0.5 and 2 ppd since age 77.  Substance Use Topics  . Alcohol use: No    Alcohol/week: 0.0 standard drinks    Comment: "quit drinking in 1986"  . Drug use: No     Allergies   Cyclobenzaprine   Review of Systems Review of Systems  Constitutional: Negative for activity change and appetite change.  HENT: Negative for congestion.   Respiratory: Negative for cough, chest tightness and shortness of breath.   Gastrointestinal: Negative for abdominal pain, nausea and vomiting.  Genitourinary: Positive for difficulty urinating, discharge and dysuria. Negative for decreased urine volume, flank pain, frequency and testicular pain.  Musculoskeletal: Negative for arthralgias and myalgias.  Neurological: Negative for dizziness, weakness and headaches.    all other systems are negative except as noted in the HPI and PMH.    Physical Exam Updated Vital Signs BP 122/60   Pulse 80   Temp 97.9 F (36.6 C) (Oral)   Resp 16   Ht 5\' 6"  (6.063 m)   Wt 72.1 kg   SpO2 97%   BMI 25.66 kg/m   Physical Exam Vitals signs and nursing note reviewed.  Constitutional:      General: He is not in acute distress.    Appearance: He is well-developed.  HENT:     Head: Normocephalic and atraumatic.     Mouth/Throat:     Pharynx: No oropharyngeal exudate.  Eyes:     Conjunctiva/sclera: Conjunctivae normal.     Pupils:  Pupils are equal, round, and reactive to light.  Neck:     Musculoskeletal: Normal range of motion and neck supple.  Comments: No meningismus. Cardiovascular:     Rate and Rhythm: Normal rate and regular rhythm.     Heart sounds: Normal heart sounds. No murmur.  Pulmonary:     Effort: Pulmonary effort is normal. No respiratory distress.     Breath sounds: Normal breath sounds.  Abdominal:     Palpations: Abdomen is soft.     Tenderness: There is no abdominal tenderness. There is no guarding or rebound.  Genitourinary:    Comments: Patient is uncircumcised.  There is white discharge within the glans.  No paraphimosis or phimosis.  Glans is erythematous.  Testicles are nontender. Musculoskeletal: Normal range of motion.        General: No tenderness.  Skin:    General: Skin is warm.  Neurological:     Mental Status: He is alert and oriented to person, place, and time.     Cranial Nerves: No cranial nerve deficit.     Motor: No abnormal muscle tone.     Coordination: Coordination normal.     Comments: No ataxia on finger to nose bilaterally. No pronator drift. 5/5 strength throughout. CN 2-12 intact.Equal grip strength. Sensation intact.   Psychiatric:        Behavior: Behavior normal.      ED Treatments / Results  Labs (all labs ordered are listed, but only abnormal results are displayed) Labs Reviewed  URINALYSIS, ROUTINE W REFLEX MICROSCOPIC - Abnormal; Notable for the following components:      Result Value   Leukocytes, UA TRACE (*)    All other components within normal limits  CBC WITH DIFFERENTIAL/PLATELET - Abnormal; Notable for the following components:   RBC 3.58 (*)    Hemoglobin 10.1 (*)    HCT 33.2 (*)    All other components within normal limits  COMPREHENSIVE METABOLIC PANEL - Abnormal; Notable for the following components:   Potassium 3.4 (*)    Glucose, Bld 131 (*)    Calcium 8.8 (*)    Total Protein 6.1 (*)    All other components within normal  limits  URINE CULTURE    EKG None  Radiology No results found.  Procedures Procedures (including critical care time)  Medications Ordered in ED Medications  fluconazole (DIFLUCAN) tablet 150 mg (150 mg Oral Given 03/31/18 0055)  cefTRIAXone (ROCEPHIN) injection 250 mg (250 mg Intramuscular Given 03/31/18 0055)  sterile water (preservative free) injection (10 mLs  Given 03/31/18 0055)     Initial Impression / Assessment and Plan / ED Course  I have reviewed the triage vital signs and the nursing notes.  Pertinent labs & imaging results that were available during my care of the patient were reviewed by me and considered in my medical decision making (see chart for details).    Diabetic patient with several week history of penile irritation and dysuria.  Exam is consistent with balanitis.  Patient given p.o. fluconazole. UA not impressive for infection. Culture sent.   We will also treat with topical antifungals as well as p.o. antibiotics.  Followup with PCP as well as urology. Return precautions discussed.  Final Clinical Impressions(s) / ED Diagnoses   Final diagnoses:  Balanitis    ED Discharge Orders    None       Louisa Favaro, Annie Main, MD 03/31/18 [phone]

## 2018-04-02 LAB — URINE CULTURE: Culture: 80000 — AB

## 2018-04-03 ENCOUNTER — Telehealth: Payer: Self-pay | Admitting: Cardiology

## 2018-04-03 ENCOUNTER — Emergency Department (HOSPITAL_COMMUNITY)
Admission: EM | Admit: 2018-04-03 | Discharge: 2018-04-03 | Disposition: A | Discharge status: Home / Self Care | Payer: Medicare Other | Attending: Emergency Medicine | Admitting: Emergency Medicine

## 2018-04-03 ENCOUNTER — Encounter (HOSPITAL_COMMUNITY): Payer: Self-pay

## 2018-04-03 ENCOUNTER — Telehealth: Payer: Self-pay

## 2018-04-03 ENCOUNTER — Emergency Department (HOSPITAL_COMMUNITY): Payer: Medicare Other

## 2018-04-03 DIAGNOSIS — I2510 Atherosclerotic heart disease of native coronary artery without angina pectoris: Secondary | ICD-10-CM | POA: Insufficient documentation

## 2018-04-03 DIAGNOSIS — I5032 Chronic diastolic (congestive) heart failure: Secondary | ICD-10-CM | POA: Insufficient documentation

## 2018-04-03 DIAGNOSIS — F17210 Nicotine dependence, cigarettes, uncomplicated: Secondary | ICD-10-CM | POA: Insufficient documentation

## 2018-04-03 DIAGNOSIS — R509 Fever, unspecified: Secondary | ICD-10-CM | POA: Diagnosis not present

## 2018-04-03 DIAGNOSIS — E119 Type 2 diabetes mellitus without complications: Secondary | ICD-10-CM | POA: Insufficient documentation

## 2018-04-03 DIAGNOSIS — R062 Wheezing: Secondary | ICD-10-CM | POA: Insufficient documentation

## 2018-04-03 DIAGNOSIS — Z79899 Other long term (current) drug therapy: Secondary | ICD-10-CM | POA: Insufficient documentation

## 2018-04-03 DIAGNOSIS — I4891 Unspecified atrial fibrillation: Secondary | ICD-10-CM

## 2018-04-03 DIAGNOSIS — I10 Essential (primary) hypertension: Secondary | ICD-10-CM | POA: Diagnosis not present

## 2018-04-03 DIAGNOSIS — J441 Chronic obstructive pulmonary disease with (acute) exacerbation: Secondary | ICD-10-CM | POA: Insufficient documentation

## 2018-04-03 DIAGNOSIS — Z794 Long term (current) use of insulin: Secondary | ICD-10-CM | POA: Insufficient documentation

## 2018-04-03 DIAGNOSIS — I959 Hypotension, unspecified: Secondary | ICD-10-CM | POA: Diagnosis not present

## 2018-04-03 DIAGNOSIS — R000 Tachycardia, unspecified: Secondary | ICD-10-CM | POA: Diagnosis not present

## 2018-04-03 DIAGNOSIS — R05 Cough: Secondary | ICD-10-CM | POA: Diagnosis not present

## 2018-04-03 HISTORY — DX: Gastro-esophageal reflux disease without esophagitis: K21.9

## 2018-04-03 HISTORY — DX: Vitamin D deficiency, unspecified: E55.9

## 2018-04-03 HISTORY — DX: Legal blindness, as defined in USA: H54.8

## 2018-04-03 HISTORY — DX: Pure hypercholesterolemia, unspecified: E78.00

## 2018-04-03 HISTORY — DX: Obesity, unspecified: E66.9

## 2018-04-03 HISTORY — DX: Allergic rhinitis, unspecified: J30.9

## 2018-04-03 HISTORY — DX: Atherosclerotic heart disease of native coronary artery without angina pectoris: I25.10

## 2018-04-03 HISTORY — DX: Intrinsic (allergic) eczema: L20.84

## 2018-04-03 HISTORY — DX: Personal history of transient ischemic attack (TIA), and cerebral infarction without residual deficits: Z86.73

## 2018-04-03 HISTORY — DX: Age-related physical debility: R54

## 2018-04-03 HISTORY — DX: Essential (primary) hypertension: I10

## 2018-04-03 LAB — COMPREHENSIVE METABOLIC PANEL
ALT: 25 U/L (ref 0–44)
AST: 31 U/L (ref 15–41)
Albumin: 3.2 g/dL — ABNORMAL LOW (ref 3.5–5.0)
Alkaline Phosphatase: 65 U/L (ref 38–126)
Anion gap: 10 (ref 5–15)
BUN: 5 mg/dL — ABNORMAL LOW (ref 8–23)
CHLORIDE: 106 mmol/L (ref 98–111)
CO2: 23 mmol/L (ref 22–32)
Calcium: 8.5 mg/dL — ABNORMAL LOW (ref 8.9–10.3)
Creatinine, Ser: 0.91 mg/dL (ref 0.61–1.24)
GFR calc Af Amer: >60 mL/min (ref >60)
GFR calc non Af Amer: >60 mL/min (ref >60)
Glucose, Bld: 143 mg/dL — ABNORMAL HIGH (ref 70–99)
POTASSIUM: 3.7 mmol/L (ref 3.5–5.1)
Sodium: 139 mmol/L (ref 135–145)
Total Bilirubin: 0.4 mg/dL (ref 0.3–1.2)
Total Protein: 6 g/dL — ABNORMAL LOW (ref 6.5–8.1)

## 2018-04-03 LAB — CBC WITH DIFFERENTIAL/PLATELET
Abs Immature Granulocytes: 0.02 10*3/uL (ref 0.00–0.07)
BASOS PCT: 1 %
Basophils Absolute: 0.1 10*3/uL (ref 0.0–0.1)
Eosinophils Absolute: 0.1 10*3/uL (ref 0.0–0.5)
Eosinophils Relative: 1 %
HCT: 36.6 % — ABNORMAL LOW (ref 39.0–52.0)
Hemoglobin: 11.1 g/dL — ABNORMAL LOW (ref 13.0–17.0)
IMMATURE GRANULOCYTES: 1 %
Lymphocytes Relative: 13 %
Lymphs Abs: 0.5 10*3/uL — ABNORMAL LOW (ref 0.7–4.0)
MCH: 28 pg (ref 26.0–34.0)
MCHC: 30.3 g/dL (ref 30.0–36.0)
MCV: 92.4 fL (ref 80.0–100.0)
Monocytes Absolute: 0.5 10*3/uL (ref 0.1–1.0)
Monocytes Relative: 11 %
NEUTROS PCT: 73 %
Neutro Abs: 3 10*3/uL (ref 1.7–7.7)
Platelets: 291 10*3/uL (ref 150–400)
RBC: 3.96 MIL/uL — ABNORMAL LOW (ref 4.22–5.81)
RDW: 14.7 % (ref 11.5–15.5)
WBC: 4.2 10*3/uL (ref 4.0–10.5)
nRBC: 0 % (ref 0.0–0.2)

## 2018-04-03 LAB — URINALYSIS, ROUTINE W REFLEX MICROSCOPIC
Bilirubin Urine: NEGATIVE
Glucose, UA: NEGATIVE mg/dL
Hgb urine dipstick: NEGATIVE
KETONES UR: 20 mg/dL — AB
Leukocytes, UA: NEGATIVE
Nitrite: NEGATIVE
PROTEIN: NEGATIVE mg/dL
Specific Gravity, Urine: 1.008 (ref 1.005–1.030)
pH: 8 (ref 5.0–8.0)

## 2018-04-03 LAB — BRAIN NATRIURETIC PEPTIDE: B Natriuretic Peptide: 101.9 pg/mL — ABNORMAL HIGH (ref 0.0–100.0)

## 2018-04-03 LAB — I-STAT TROPONIN, ED: Troponin i, poc: 0.01 ng/mL (ref 0.00–0.08)

## 2018-04-03 LAB — MAGNESIUM: Magnesium: 2 mg/dL (ref 1.7–2.4)

## 2018-04-03 LAB — I-STAT CG4 LACTIC ACID, ED: Lactic Acid, Venous: 0.9 mmol/L (ref 0.5–1.9)

## 2018-04-03 MED ORDER — IPRATROPIUM-ALBUTEROL 0.5-2.5 (3) MG/3ML IN SOLN
3.0000 mL | Freq: Once | RESPIRATORY_TRACT | Status: AC
  Administered 2018-04-03: 3 mL via RESPIRATORY_TRACT
  Filled 2018-04-03: qty 3

## 2018-04-03 MED ORDER — ALBUTEROL SULFATE HFA 108 (90 BASE) MCG/ACT IN AERS
2.0000 | INHALATION_SPRAY | Freq: Once | RESPIRATORY_TRACT | Status: AC
  Administered 2018-04-03: 2 via RESPIRATORY_TRACT
  Filled 2018-04-03: qty 6.7

## 2018-04-03 MED ORDER — PREDNISONE 20 MG PO TABS: 40.0000 mg | ORAL_TABLET | Freq: Every day | ORAL | 0 refills | Status: DC

## 2018-04-03 MED ORDER — PREDNISONE 20 MG PO TABS
60.0000 mg | ORAL_TABLET | Freq: Once | ORAL | Status: AC
  Administered 2018-04-03: 60 mg via ORAL
  Filled 2018-04-03: qty 3

## 2018-04-03 MED ORDER — SODIUM CHLORIDE 0.9 % IV BOLUS
500.0000 mL | Freq: Once | INTRAVENOUS | Status: AC
  Administered 2018-04-03: 500 mL via INTRAVENOUS

## 2018-04-03 NOTE — Telephone Encounter (Signed)
Pt currently in the ED.

## 2018-04-03 NOTE — Care Management (Signed)
   Home health agencies that serve [phone]. Your favorite home health agencies  [place] of Patient Care Rating Patient Survey Summary Rating  [place]  [phone] 4 out of 5 stars 4 out of [place]  [phone] 3 out of 5 stars 5 out of [place]  [phone] 3 out of 5 stars 4 out of [place]  [phone] 4  out of 5 stars 4 out of [place]  [phone] 4  out of 5 stars 4 out of [place]  [phone] 4 out of 5 stars 4 out of 5 stars  [facility] [place]  [phone] 4 out of 5 stars 4 out of [place]  [phone] 3 out of 5 stars 4 out of 5 stars  [facility]  [phone] 3  out of 5 stars [facility] [place]  [phone] 3 out of 5 stars 4 out of [place]  [phone] 3  out of 5 stars 3 out of [place]  [phone] 5 out of 5 stars 3 out of [place]  [phone]

## 2018-04-03 NOTE — Discharge Instructions (Addendum)
Your heart rate has improved here please continue taking your Cardizem as directed.  I think your cough is likely related to a mild COPD exacerbation, please take steroids for the next 4 days as directed and use albuterol inhaler as needed for wheezing, cough or shortness of breath.  Please follow-up with your regular doctor and cardiologist.  Return to the emergency department for fevers, chest pain, shortness of breath or any other new or concerning symptoms.

## 2018-04-03 NOTE — ED Provider Notes (Signed)
[facility] EMERGENCY DEPARTMENT Provider Note   CSN: [id number] Arrival date & time: 04/03/18  1055     History   Chief Complaint Chief Complaint  Patient presents with  . Atrial Fibrillation  . Fever    HPI [name] is a 78 y.o. male.  [name] is a 78 y.o. male with a history of hypertension, diabetes, diastolic heart failure, CAD, COPD, paroxysmal A. Fib, and anemia, who presents to the emergency department via EMS for evaluation of atrial fibrillation with elevated heart rate as well as fever.  Patient reports that during the middle of the night started to feel like his heart was racing and he checked his heart rate with his monitor at home and it got as high as 175, he reports associated palpitations but no chest pain.  He reports at 6 AM he took 300 mg of diltiazem but continued to have palpitations and sensation that his heart was racing, attempted multiple times to contact his cardiologist Dr. Curt Bears office but was unable to get through to a nurse or provider so called EMS and presented for evaluation.  Patient was found to be febrile to 101.6 with EMS.  Reports for the past week he has had an intermittent cough, typically dry nonproductive as well as some nasal congestion.  He denies fevers or chills at home.  Reports he has had some intermittent wheezing and left-sided chest discomfort primarily with coughing.  Denies any current chest pain or shortness of breath.  No nausea or vomiting, denies any abdominal pain.  Patient was seen at [facility] 3 days prior on 1/5 for evaluation of dysuria, was not found to have a urinary tract infection but did have signs of balanitis, given patient's history of diabetes he has been treated with antibiotics and topical antifungals, has been taking these medications as directed and patient reports mild improvement.  Of note patient afebrile on arrival to the emergency department, found to be in A. fib, with heart rate  of 106.     Past Medical History:  Diagnosis Date  . Anemia    takes Ferrous Sulfate daily  . Arthritis    "all over"  . Balance problem 01/2014  . CAP (community acquired pneumonia) 09/18/2014  . Cervical radiculopathy due to degenerative joint disease of spine   . COPD (chronic obstructive pulmonary disease) ([place])   . Coronary artery disease, non-occlusive    a. 03/2010 Nonocclusive disease by cath, performed for ST elevations on ECG;  b. 06/2013 Lexi MV: EF 60%, no ischemia.  . Diabetes mellitus type II    takes Metformin and Lantus daily  . Diastolic CHF, chronic ([place])    a. 12/2012 EF 55-60%, diast dysfxn, triv MR, mildly dil LA/RA.  [name] History of blood transfusion 1982   "when I had stomach OR"  . History of bronchitis    1998  . History of gastric ulcer   . HTN (hypertension)    takes Diltiazem daily  . Hyperlipidemia    takes Pravastatin daily  . Joint pain   . PAF (paroxysmal atrial fibrillation) (HCC)    Recurrent after atrial flutter (a. 07/2010 Status post caval tricuspid isthmus ablation by Dr. Midge Aver Metoprolol daily), currently controlled on flecainide plus diltiazem and Coumadin  . Weakness    numbness and tingling both hands    Patient Active Problem List   Diagnosis Date Noted  . Rapid atrial fibrillation ([place]) 01/18/2018  . Atrial fibrillation with RVR ([place]) 11/08/2017  .  Chest pain with moderate risk of acute coronary syndrome 10/04/2017  . HTN (hypertension) 05/21/2017  . Diabetes mellitus type II 05/21/2017  . Diastolic CHF, chronic ([place]) 05/21/2017  . COPD (chronic obstructive pulmonary disease) ([place]) 05/21/2017  . H/O atrial flutter 05/21/2017  . Coronary disease/nonobstructive 05/21/2017  . Acute blood loss anemia 10/02/2016  . Abdominal pain 10/02/2016  . Constipation 10/02/2016  . Left lower quadrant pain   . Coagulopathy ([place])   . Heme positive stool   . Symptomatic anemia 07/10/2016  . Syncope 05/15/2015  . Cervical disc disease  05/15/2015  . Abnormal urinalysis 05/15/2015  . Anemia 05/15/2015  . History of CVA (cerebrovascular accident) 04/03/2014  . Near syncope 02/15/2014  . Pre-syncope 02/15/2014  . Benign neoplasm of rectum and anal canal 12/02/2013  . Upper GI bleeding 11/30/2013  . Tobacco use disorder 11/30/2013  . Anticoagulation goal of INR 2 to 3, for PAF - CHA2DS2Vasc = 7 08/15/2013  . Type 2 diabetes mellitus ([place]) 01/22/2013  . Tobacco abuse counseling 12/15/2012  . Hyperlipidemia   . Obesity (BMI 30-39.9) 12/14/2012  . Persistent atrial fibrillation ([place]) 10/07/2010  . Atrial flutter - status post CTI ablation 07/29/2010  . Essential hypertension 07/29/2010  . Chronic diastolic congestive heart failure ([place]) 07/29/2010  . Coronary artery disease, non-occlusive 07/29/2010    Past Surgical History:  Procedure Laterality Date  . ATRIAL ABLATION SURGERY  08/05/10   CTI ablation for atrial flutter by [facility]  . CARDIAC CATHETERIZATION  2012   nl LV function, no occlusive CAD, PAF  . CARDIOVERSION  12/07/2010    Successful direct current cardioversion with atrial fibrillation to normal sinus rhythm  . CARPAL TUNNEL RELEASE Bilateral 01/30/2014   Procedure: BILATERAL CARPAL TUNNEL RELEASE;  Surgeon: Marianna Payment, MD;  Location: [facility];  Service: Orthopedics;  Laterality: Bilateral;  . CATARACT EXTRACTION W/ INTRAOCULAR LENS  IMPLANT, BILATERAL Bilateral   . COLONOSCOPY [name] 12/02/2013   Procedure: COLONOSCOPY;  Surgeon: Irene Shipper, MD;  Location: [facility];  Service: Endoscopy;  Laterality: [name];  . ESOPHAGOGASTRODUODENOSCOPY [name] 09/22/2014   Procedure: ESOPHAGOGASTRODUODENOSCOPY (EGD);  Surgeon: Ronald Lobo, MD;  Location: [facility] ENDOSCOPY;  Service: Endoscopy;  Laterality: [name];  . ESOPHAGOGASTRODUODENOSCOPY [name] 07/11/2016   Procedure: ESOPHAGOGASTRODUODENOSCOPY (EGD);  Surgeon: Doran Stabler, MD;  Location: [facility] ENDOSCOPY;  Service: Endoscopy;  Laterality: [name];  . INCISION AND DRAINAGE ABSCESS /  HEMATOMA OF BURSA / KNEE / THIGH Left 1998   knee  . KNEE BURSECTOMY Left 1998  . LAPAROSCOPIC CHOLECYSTECTOMY  03/2010  . LEFT HEART CATH AND CORONARY ANGIOGRAPHY [name] 10/05/2017   Procedure: LEFT HEART CATH AND CORONARY ANGIOGRAPHY;  Surgeon: Martinique, Peter M, MD;  Location: [facility] CV LAB;  Service: Cardiovascular;  Laterality: [name];  . NM MYOVIEW LTD  07/22/2013   Normal EF ~60%, no ischemia or infarction.  [name] PARTIAL GASTRECTOMY  1982   subtotal; "took out 30% for ulcers"  . TRANSTHORACIC ECHOCARDIOGRAM  02/16/2014   EF 60%, no RWMA. - otherwise normal  . YAG LASER APPLICATION Bilateral         Home Medications    Prior to Admission medications   Medication Sig Start Date End Date Taking? Authorizing Provider  albuterol (PROVENTIL HFA;VENTOLIN HFA) 108 (90 Base) MCG/ACT inhaler Inhale 2 puffs into the lungs every 4 (four) hours as needed for wheezing or shortness of breath. 10/30/17   Palumbo, April, MD  aspirin EC 81 MG tablet Take 81 mg by mouth daily.  [provider]  clotrimazole (LOTRIMIN) 1 % cream Apply to affected area 2 times daily 03/31/18   Rancour, Annie Main, MD  co-enzyme Q-10 30 MG capsule Take 30 mg by mouth daily.     [provider]  diclofenac sodium (VOLTAREN) 1 % GEL Apply 4 g topically 3 (three) times daily as needed (for back pain). 11/10/17   Rory Percy, DO  diltiazem (CARDIZEM CD) 300 MG 24 hr capsule Take 1 capsule (300 mg total) by mouth daily. 02/19/18   Camnitz, Will Hassell Done, MD  diltiazem (CARDIZEM) 30 MG tablet TAKE 1 TABLET BY MOUTH 4 TIMES DAILY AS NEEDED FOR AFIB 03/29/18   Camnitz, Ocie Doyne, MD  ferrous sulfate 325 (65 FE) MG tablet Take 1 tablet by mouth 4 (four) times a week. Takes M/T/W/F 02/14/18   [provider]  glucose blood (PRECISION QID TEST) test strip Check glucose twice daily 10/24/13   [provider]  hydrocortisone cream 1 % Apply to affected area 2 times daily 12/24/17   Maczis, Barth Kirks, PA-C    Melatonin 3 MG TABS Take 1 tablet (3 mg total) by mouth at bedtime. 11/16/17   Anderson, Chelsey L, DO  metFORMIN (GLUCOPHAGE) 1000 MG tablet Take 1,000 mg by mouth 2 (two) times daily with a meal.     [provider]  metroNIDAZOLE (FLAGYL) 500 MG tablet Take 1 tablet (500 mg total) by mouth 2 (two) times daily. 03/31/18   Rancour, Annie Main, MD  Multiple Vitamin (MULTIVITAMIN WITH MINERALS) TABS tablet Take 1 tablet by mouth 2 (two) times daily.     [provider]  nitroGLYCERIN (NITROSTAT) 0.4 MG SL tablet Place 1 tablet (0.4 mg total) under the tongue every 5 (five) minutes x 3 doses as needed for chest pain. 11/19/15   Erlene Quan, PA-C  nystatin cream (MYCOSTATIN) Apply to affected area 2 times daily 01/02/18   Deno Etienne, DO  pantoprazole (PROTONIX) 40 MG tablet Take 1 tablet (40 mg total) by mouth 2 (two) times daily before a meal. 11/30/16   Domenic Polite, MD  polyethylene glycol [facility] / Floria Raveling) packet Take 17 g by mouth as needed for moderate constipation. 11/10/17   Rory Percy, DO  pravastatin (PRAVACHOL) 40 MG tablet Take 1 tablet (40 mg total) by mouth every evening. 05/04/17   Leonie Man, MD  tamsulosin (FLOMAX) 0.4 MG CAPS capsule Take 0.4 mg by mouth daily. 02/20/17   [provider]  vitamin C (ASCORBIC ACID) 250 MG tablet Take 1 tablet (250 mg total) by mouth daily. 01/20/18   Rise Mu, PA-C    Family History Family History  Problem Relation Age of Onset  . Breast cancer Mother   . Heart attack Father   . Heart attack Brother     Social History Social History   Tobacco Use  . Smoking status: Current Every Day Smoker    Packs/day: 0.50    Years: 57.00    Pack years: 28.50    Types: Cigarettes  . Smokeless tobacco: Never Used  . Tobacco comment: He's been smoking between 0.5 and 2 ppd since age 78.  Substance Use Topics  . Alcohol use: No    Alcohol/week: 0.0 standard drinks    Comment: "quit drinking in 1986"  . Drug use:  No     Allergies   Cyclobenzaprine   Review of Systems Review of Systems  Constitutional: Positive for chills and fever.  HENT: Positive for congestion and rhinorrhea. Negative for ear  discharge, ear pain and sore throat.   Eyes: Negative for visual disturbance.  Respiratory: Positive for cough, shortness of breath and wheezing. Negative for chest tightness.   Cardiovascular: Negative for chest pain, palpitations and leg swelling.  Gastrointestinal: Negative for abdominal pain, nausea and vomiting.  Genitourinary: Positive for dysuria. Negative for frequency.  Musculoskeletal: Negative for arthralgias and myalgias.  Skin: Negative for color change and rash.  Neurological: Negative for dizziness, syncope, light-headedness and headaches.     Physical Exam Updated Vital Signs BP 129/65 (BP Location: Right Arm)   Pulse (!) 106   Temp 99.2 F (37.3 C) (Rectal)   Resp 16   SpO2 97%   Physical Exam Vitals signs and nursing note reviewed.  Constitutional:      General: He is not in acute distress.    Appearance: Normal appearance. He is well-developed and normal weight. He is not ill-appearing or diaphoretic.  HENT:     Head: Normocephalic and atraumatic.     Nose: Congestion and rhinorrhea present.     Mouth/Throat:     Mouth: Mucous membranes are moist.     Pharynx: Oropharynx is clear. Posterior oropharyngeal erythema present. No oropharyngeal exudate.  Eyes:     General:        Right eye: No discharge.        Left eye: No discharge.  Neck:     Musculoskeletal: Neck supple.  Cardiovascular:     Rate and Rhythm: Tachycardia present. Rhythm irregular.     Pulses: Normal pulses.          Radial pulses are 2+ on the right side and 2+ on the left side.       Dorsalis pedis pulses are 2+ on the right side and 2+ on the left side.     Heart sounds: Normal heart sounds. No murmur. No friction rub. No gallop.      Comments: Irregularly irregular with heart rates ranging from  90s to 110s Pulmonary:     Effort: Pulmonary effort is normal. No respiratory distress.     Breath sounds: Rhonchi present.     Comments: No respiratory distress, patient able to speak in full sentences with no respiratory effort, on auscultation patient has rhonchi throughout the left lung field, right lung is clear to auscultation with good air movement, intermittent dry cough during exam Abdominal:     General: Abdomen is flat. Bowel sounds are normal. There is no distension.     Palpations: Abdomen is soft. There is no mass.     Tenderness: There is no abdominal tenderness. There is no guarding.     Comments: Abdomen soft, nondistended, nontender to palpation in all quadrants without guarding or peritoneal signs  Musculoskeletal:        General: No deformity.     Right lower leg: No edema.     Left lower leg: No edema.  Skin:    General: Skin is warm and dry.     Capillary Refill: Capillary refill takes less than 2 seconds.  Neurological:     General: No focal deficit present.     Mental Status: He is alert and oriented to person, place, and time. Mental status is at baseline.     Coordination: Coordination normal.  Psychiatric:        Mood and Affect: Mood normal.        Behavior: Behavior normal.      ED Treatments / Results  Labs (all labs ordered are listed,  but only abnormal results are displayed) Labs Reviewed  CBC WITH DIFFERENTIAL/PLATELET - Abnormal; Notable for the following components:      Result Value   RBC 3.96 (*)    Hemoglobin 11.1 (*)    HCT 36.6 (*)    Lymphs Abs 0.5 (*)    All other components within normal limits  COMPREHENSIVE METABOLIC PANEL - Abnormal; Notable for the following components:   Glucose, Bld 143 (*)    BUN 5 (*)    Calcium 8.5 (*)    Total Protein 6.0 (*)    Albumin 3.2 (*)    All other components within normal limits  BRAIN NATRIURETIC PEPTIDE - Abnormal; Notable for the following components:   B Natriuretic Peptide 101.9 (*)     All other components within normal limits  URINALYSIS, ROUTINE W REFLEX MICROSCOPIC - Abnormal; Notable for the following components:   Ketones, ur 20 (*)    All other components within normal limits  CULTURE, BLOOD (ROUTINE X 2)  CULTURE, BLOOD (ROUTINE X 2)  MAGNESIUM  I-STAT CG4 LACTIC ACID, ED  I-STAT TROPONIN, ED    EKG EKG Interpretation  Date/Time:  Wednesday April 03 2018 10:58:09 EST Ventricular Rate:  86 PR Interval:    QRS Duration: 83 QT Interval:  378 QTC Calculation: 439 R Axis:   -8 Text Interpretation:  Atrial fibrillation Ventricular premature complex Abnormal R-wave progression, early transition Baseline wander Artifact When compared with ECG of 01/18/2018 Artifact is present Otherwise no significant change Confirmed by Francine Graven [phone]) on 04/04/2018 5:57:20 PM   Radiology Dg Chest 2 View  Result Date: 04/03/2018 CLINICAL DATA:  Atrial fibrillation, fever EXAM: CHEST - 2 VIEW COMPARISON:  01/18/2018 FINDINGS: Heart and mediastinal contours are within normal limits. No focal opacities or effusions. No acute bony abnormality. IMPRESSION: No active cardiopulmonary disease. Electronically Signed   By: Rolm Baptise M.D.   On: 04/03/2018 12:09    Procedures Procedures (including critical care time)  Medications Ordered in ED Medications  sodium chloride 0.9 % bolus 500 mL (0 mLs Intravenous Stopped 04/03/18 1237)  ipratropium-albuterol (DUONEB) 0.5-2.5 (3) MG/3ML nebulizer solution 3 mL (3 mLs Nebulization Given 04/03/18 1249)  predniSONE (DELTASONE) tablet 60 mg (60 mg Oral Given 04/03/18 1319)  ipratropium-albuterol (DUONEB) 0.5-2.5 (3) MG/3ML nebulizer solution 3 mL (3 mLs Nebulization Given 04/03/18 1319)  albuterol (PROVENTIL HFA;VENTOLIN HFA) 108 (90 Base) MCG/ACT inhaler 2 puff (2 puffs Inhalation Given 04/03/18 1319)     Initial Impression / Assessment and Plan / ED Course  I have reviewed the triage vital signs and the nursing notes.  Pertinent labs &  imaging results that were available during my care of the patient were reviewed by me and considered in my medical decision making (see chart for details).  Pt presents for evaluation of afib with elevated heart rate, and was febrile with EMS. Reports cough over the past few days with some left sided chest discomfort described as tightness worse with breathing. On arrival pt with HR of 106, afebrile rectally and all other vitals normal. He appears well and in no acute distress. Left sided rhonchi on exam, right lung clear. Abdomen benign. Recently seen for urinary symptoms are diagnosed with balanitis, being treated with flagyl and antifungals.  Concern for pneumonia versus COPD exacerbation with lung exam either of those could have likely tipped patient into A. fib with RVR, although it appears his heart rate is much improved from what patient reported at home prior to taking his  300 mg of diltiazem.  Will give 500 cc bolus and pursue infectious work-up with basic labs, blood cultures, lactic acid, chest x-ray, urinalysis will also get EKG, troponin and BNP.  EKG shows A. fib with heart rate of 86, troponin negative.  Labs overall reassuring, no leukocytosis, stable hemoglobin, no acute electrolyte derangements requiring intervention, normal renal and liver function.  Lactic acid not elevated, magnesium within normal limits.  Blood cultures collected.  Urinalysis without signs of infection.  Chest x-ray shows no infiltrate or active cardiopulmonary disease.  Nebulizer treatment given with significant improvement in rhonchi.  Of one additional breathing treatment given.  Given no leukocytosis or elevation in lactic acid and no infiltrate on x-ray suspect lung sounds more likely due to COPD will give prednisone.  Work-up is largely reassuring and heart rate has remained in the 80s after fluids, no additional episodes of rapid ventricular response, BNP not significantly elevated and patient not complaining of  any additional chest pain after rhonchi were resolved.  Discussed reassuring work-up with patient he does report that he stays with his daughter but is home by himself for many hours during the day and is requesting assistance at home, case management consult provided for home health.  At this time I feel patient is stable for discharge home, provided albuterol inhaler here in the department and will give short burst of steroids.  Return precautions discussed.  Patient expresses understanding and agreement with plan.  Discharged home in good condition.  Patient discussed with Dr. Dayna Barker, who saw patient as well and agrees with plan.  Final Clinical Impressions(s) / ED Diagnoses   Final diagnoses:  Atrial fibrillation with RVR ([place])  COPD with exacerbation [facility])    ED Discharge Orders         Ordered    predniSONE (DELTASONE) 20 MG tablet  Daily,   Status:  Discontinued     04/03/18 [place], Reynaldo Rossman [place], [place] 04/08/18 [id number]    Merrily Pew, MD 04/11/18 2340

## 2018-04-03 NOTE — ED Notes (Signed)
Patient verbalizes understanding of discharge instructions. Opportunity for questioning and answers were provided. Armband removed by staff, pt discharged from ED ambulatory by self\  

## 2018-04-03 NOTE — Telephone Encounter (Signed)
New Message    STAT if HR is under 50 or over 120 (normal HR is 60-100 beats per minute)  1) What is your heart rate? 176  2) Do you have a log of your heart rate readings (document readings)? Yes keeps log in HR machine   3) Do you have any other symptoms? When heart flutters he has SOB. Upper chest and left side under arm pit he had pain,

## 2018-04-03 NOTE — Discharge Planning (Signed)
Pt currently active with [facility] for RN/PT services.  Resumption of care requested. [facility] notified.  No DME needs identified at this time.

## 2018-04-03 NOTE — ED Triage Notes (Signed)
Pt presents for evaluation of afib and fever. Pt seen at [facility] on 1/5, given abx for infection.

## 2018-04-03 NOTE — Telephone Encounter (Signed)
Post ED Visit - Positive Culture Follow-up  Culture report reviewed by antimicrobial stewardship pharmacist:  []  Elenor Quinones, Pharm.D. []  Heide Guile, Pharm.D., BCPS AQ-ID []  Parks Neptune, Pharm.D., BCPS []  Alycia Rossetti, Pharm.D., BCPS []  [place], Pharm.D., BCPS, AAHIVP []  Legrand Como, Pharm.D., BCPS, AAHIVP []  Salome Arnt, PharmD, BCPS []  Johnnette Gourd, PharmD, BCPS []  Hughes Better, PharmD, BCPS []  Leeroy Cha, PharmD C Charlena Cross Pharm D Positive urine culture  and no further patient follow-up is required at this time.  Genia Del 04/03/2018, 10:37 AM

## 2018-04-04 ENCOUNTER — Encounter: Payer: Self-pay | Admitting: Family Medicine

## 2018-04-04 ENCOUNTER — Other Ambulatory Visit (HOSPITAL_COMMUNITY)
Admission: RE | Admit: 2018-04-04 | Discharge: 2018-04-04 | Disposition: A | Discharge status: Home / Self Care | Payer: Medicare Other | Source: Ambulatory Visit | Attending: Family Medicine | Admitting: Family Medicine

## 2018-04-04 ENCOUNTER — Ambulatory Visit (INDEPENDENT_AMBULATORY_CARE_PROVIDER_SITE_OTHER): Payer: Medicare Other | Admitting: Family Medicine

## 2018-04-04 VITALS — BP 138/75 | HR 88 | Resp 17 | Ht 66.0 in | Wt 158.6 lb

## 2018-04-04 DIAGNOSIS — R350 Frequency of micturition: Secondary | ICD-10-CM

## 2018-04-04 DIAGNOSIS — R369 Urethral discharge, unspecified: Secondary | ICD-10-CM

## 2018-04-04 DIAGNOSIS — N401 Enlarged prostate with lower urinary tract symptoms: Secondary | ICD-10-CM

## 2018-04-04 DIAGNOSIS — E11628 Type 2 diabetes mellitus with other skin complications: Secondary | ICD-10-CM | POA: Diagnosis not present

## 2018-04-04 DIAGNOSIS — F17210 Nicotine dependence, cigarettes, uncomplicated: Secondary | ICD-10-CM | POA: Diagnosis not present

## 2018-04-04 DIAGNOSIS — N481 Balanitis: Secondary | ICD-10-CM | POA: Diagnosis not present

## 2018-04-04 DIAGNOSIS — Z7689 Persons encountering health services in other specified circumstances: Secondary | ICD-10-CM

## 2018-04-04 DIAGNOSIS — N138 Other obstructive and reflux uropathy: Secondary | ICD-10-CM

## 2018-04-04 MED ORDER — CLOTRIMAZOLE-BETAMETHASONE 1-0.05 % EX CREA: 1.0000 "application " | TOPICAL_CREAM | Freq: Two times a day (BID) | CUTANEOUS | 0 refills | Status: DC

## 2018-04-04 MED ORDER — CEPHALEXIN 500 MG PO CAPS: 1000.0000 mg | ORAL_CAPSULE | Freq: Two times a day (BID) | ORAL | 0 refills | Status: DC

## 2018-04-04 NOTE — Patient Instructions (Addendum)
Thank you for choosing Primary Care at [facility] to be your medical home!    [name] was seen by Molli Barrows, FNP today.   [name]'s primary care provider is Scot Jun, FNP.   For the best care possible, you should try to see Molli Barrows, FNP-C whenever you come to the clinic.   We look forward to seeing you again soon!  If you have any questions about your visit today, please call us at [phone] or feel free to reach your primary care provider via [place].      Balanitis  Balanitis is swelling and irritation (inflammation) of the head of the penis (glans penis). The condition may also cause inflammation of the skin around the glans penis (foreskin) in men who have not been circumcised. It may develop because of an infection or another medical condition. Balanitis occurs most often among men who have not had their foreskin removed (uncircumcised men). Balanitis sometimes causes scarring of the penis or foreskin, which can require surgery. Untreated balanitis can increase the risk of penile cancer. What are the causes? Common causes of this condition include:  Poor personal hygiene, especially in uncircumcised men. Not cleaning the glans penis and foreskin well can result in buildup of bacteria, viruses, and yeast, which can lead to infection and inflammation.  Irritation and lack of air flow due to fluid (smegma) that can build up on the glans penis. Other causes include:  Chemical irritation from products such as soaps or shower gels (especially those that have fragrance), condoms, personal lubricants, petroleum jelly, spermicides, or fabric softeners.  Skin conditions, such as eczema, dermatitis, and psoriasis.  Allergies to medicines, such as tetracycline and sulfa drugs.  Certain medical conditions, including liver cirrhosis, congestive heart failure, diabetes, and kidney disease.  Infections, such as candidiasis, HPV (human  papillomavirus), herpes simplex, gonorrhea, and syphilis.  Severe obesity. What increases the risk? The following factors may make you more likely to develop this condition:  Having diabetes. This is the most common risk factor.  Having a tight foreskin that is difficult to pull back (retract) past the glans.  Having sexual intercourse without using a condom. What are the signs or symptoms? Symptoms of this condition include:  Discharge from under the foreskin.  A bad smell.  Pain or difficulty retracting the foreskin.  Tenderness, redness, and swelling of the glans.  A rash or sores on the glans or foreskin.  Itchiness.  Inability to get an erection due to pain.  Difficulty urinating.  Scarring of the penis or foreskin, in some cases. How is this diagnosed? This condition may be diagnosed based on:  A physical exam.  Testing a swab of discharge to check for bacterial or fungal infection.  Blood tests: ? To check for viruses that can cause balanitis. ? To check your blood sugar (glucose) level. High blood glucose could be a sign of diabetes, which can cause balanitis. How is this treated? Treatment for balanitis depends on the cause. Treatment may include:  Improving personal hygiene. Your health care provider may recommend sitting in a bath of warm water that is deep enough to cover your hips and buttocks (sitz bath).  Medicines such as: ? Creams or ointments to reduce swelling (steroids) or to treat an infection. ? Antibiotic medicine. ? Antifungal medicine.  Surgery to remove or cut the foreskin (circumcision). This may be done if you have scarring on the foreskin that makes it difficult to retract.  Controlling other medical  problems that may be causing your condition or making it worse. Follow these instructions at home:  Do not have sex until the condition clears up, or until your health care provider approves.  Keep your penis clean and dry. Take sitz  baths as recommended by your health care provider.  Avoid products that irritate your skin or make symptoms worse, such as soaps and shower gels that have fragrance.  Take over-the-counter and prescription medicines only as told by your health care provider. ? If you were prescribed an antibiotic medicine or a cream or ointment, use it as told by your health care provider. Do not stop using your medicine, cream, or ointment even if you start to feel better. ? Do not drive or use heavy machinery while taking prescription pain medicine. Contact a health care provider if:  Your symptoms get worse or do not improve with home care.  You develop chills or a fever.  You have trouble urinating.  You cannot retract your foreskin. Get help right away if:  You develop severe pain.  You are unable to urinate. Summary  Balanitis is inflammation of the head of the penis (glans penis) caused by irritation or infection.  Balanitis causes pain, redness, and swelling of the glans penis.  This condition is most common among uncircumcised men who do not keep their glans penis clean and in men who have diabetes.  Treatment may include creams or ointments.  Good hygiene is important for prevention. This includes pulling back the foreskin when washing your penis. This information is not intended to replace advice given to you by your health care provider. Make sure you discuss any questions you have with your health care provider. Document Released: 07/30/2008 Document Revised: 01/31/2016 Document Reviewed: 01/31/2016 Elsevier Interactive Patient Education  2019 [employer].

## 2018-04-04 NOTE — Progress Notes (Signed)
[name], is a 78 y.o. male  MVH:[id number]  [id number]:[id number]  DOB - 1940-06-16  CC:  Chief Complaint  Patient presents with  . Establish Care  . Rash    seen at ED on 1/5 for balanitis       HPI: [name] is a 78 y.o. male is here today to establish care, chronic penile rash, and urinary urgency & frequency.   [name] has Atrial flutter - status post CTI ablation; Essential hypertension; Chronic diastolic congestive heart failure ([place]); Coronary artery disease, non-occlusive; Persistent atrial fibrillation ([place]); Obesity (BMI 30-39.9); Tobacco abuse counseling; Hyperlipidemia; Type 2 diabetes mellitus ([place]); Anticoagulation goal of INR 2 to 3, for PAF - CHA2DS2Vasc = 7; Upper GI bleeding; Tobacco use disorder; Benign neoplasm of rectum and anal canal; Near syncope; Pre-syncope; History of CVA (cerebrovascular accident); Syncope; Cervical disc disease; Abnormal urinalysis; Anemia; Symptomatic anemia; Heme positive stool; Left lower quadrant pain; Coagulopathy ([place]); Acute blood loss anemia; Abdominal pain; Constipation; HTN (hypertension); Diabetes mellitus type II; Diastolic CHF, chronic (HCC); COPD (chronic obstructive pulmonary disease) ([place]); H/O atrial flutter; Coronary disease/nonobstructive; Chest pain with moderate risk of acute coronary syndrome; Atrial fibrillation with RVR ([place]); and Rapid atrial fibrillation ([place]) on their problem list.    Today's visit:  Reviewed prior medical history and EMR records. Patient has experienced a chronic on-going glans penis rash. He has previously treated with multiple courses of antifungal cream, fluconazole, 250 g of Rocephin while in the ER and most recently placed on metronidazole oral therapy without improvement of rash. He seen in the ER as burning of foreskin has worsened. He suffers from urinary frequency and urgency secondary to a history of BPH.  He currently takes Flomax although reports that he urinates at least 3-4 times per  hour.  Renal function labs recently showed normal functioning.  He has had no prior evaluation by urology.  Denies incontinence.  Urinary frequency is interfering with rest as he wakes up multiple times during the night to urinate.  [name] suffers from type 2 diabetes however most recent A1c showed a level below diabetes range with an A1c of 5.5. He is currently still taking metformin 1000 mg BID.  Current medications: Current Outpatient Medications:  .  albuterol (PROVENTIL HFA;VENTOLIN HFA) 108 (90 Base) MCG/ACT inhaler, Inhale 2 puffs into the lungs every 4 (four) hours as needed for wheezing or shortness of breath., Disp: 1 Inhaler, Rfl: 0 .  aspirin EC 81 MG tablet, Take 81 mg by mouth daily., Disp: , Rfl:  .  clotrimazole (LOTRIMIN) 1 % cream, Apply to affected area 2 times daily, Disp: 15 g, Rfl: 0 .  co-enzyme Q-10 30 MG capsule, Take 30 mg by mouth daily. , Disp: , Rfl:  .  diclofenac sodium (VOLTAREN) 1 % GEL, Apply 4 g topically 3 (three) times daily as needed (for back pain)., Disp: 1 Tube, Rfl: 0 .  diltiazem (CARDIZEM CD) 300 MG 24 hr capsule, Take 1 capsule (300 mg total) by mouth daily., Disp: 30 capsule, Rfl: 6 .  diltiazem (CARDIZEM) 30 MG tablet, TAKE 1 TABLET BY MOUTH 4 TIMES DAILY AS NEEDED FOR AFIB, Disp: 30 tablet, Rfl: 11 .  ferrous sulfate 325 (65 FE) MG tablet, Take 1 tablet by mouth 4 (four) times a week. Takes M/T/W/F, Disp: , Rfl:  .  glucose blood (PRECISION QID TEST) test strip, Check glucose twice daily, Disp: , Rfl:  .  hydrocortisone cream 1 %, Apply to affected area 2 times daily, Disp:  15 g, Rfl: 0 .  Melatonin 3 MG TABS, Take 1 tablet (3 mg total) by mouth at bedtime., Disp: 30 tablet, Rfl: 0 .  metFORMIN (GLUCOPHAGE) 1000 MG tablet, Take 1,000 mg by mouth 2 (two) times daily with a meal. , Disp: , Rfl:  .  metroNIDAZOLE (FLAGYL) 500 MG tablet, Take 1 tablet (500 mg total) by mouth 2 (two) times daily., Disp: 20 tablet, Rfl: 0 .  Multiple Vitamin  (MULTIVITAMIN WITH MINERALS) TABS tablet, Take 1 tablet by mouth 2 (two) times daily. , Disp: , Rfl:  .  nitroGLYCERIN (NITROSTAT) 0.4 MG SL tablet, Place 1 tablet (0.4 mg total) under the tongue every 5 (five) minutes x 3 doses as needed for chest pain., Disp: 25 tablet, Rfl: 9 .  nystatin cream (MYCOSTATIN), Apply to affected area 2 times daily, Disp: 30 g, Rfl: 0 .  pantoprazole (PROTONIX) 40 MG tablet, Take 1 tablet (40 mg total) by mouth 2 (two) times daily before a meal., Disp: , Rfl:  .  polyethylene glycol (MIRALAX / GLYCOLAX) packet, Take 17 g by mouth as needed for moderate constipation., Disp: 10 each, Rfl: 0 .  pravastatin (PRAVACHOL) 40 MG tablet, Take 1 tablet (40 mg total) by mouth every evening., Disp: 90 tablet, Rfl: 3 .  predniSONE (DELTASONE) 20 MG tablet, Take 2 tablets (40 mg total) by mouth daily for 4 days., Disp: 8 tablet, Rfl: 0 .  tamsulosin (FLOMAX) 0.4 MG CAPS capsule, Take 0.4 mg by mouth daily., Disp: , Rfl:  .  vitamin C (ASCORBIC ACID) 250 MG tablet, Take 1 tablet (250 mg total) by mouth daily., Disp: 30 tablet, Rfl: 2   Pertinent family medical history: family history includes Breast cancer in his mother; Diabetes in his mother; Heart attack in his brother and father; Heart disease in his father; Kidney disease in his mother; Leukemia in his daughter.   Allergies  Allergen Reactions  . Cyclobenzaprine Other (See Comments)    Unsteady gait    Social History   Socioeconomic History  . Marital status: Widowed    Spouse name: Not on file  . Number of children: Not on file  . Years of education: Not on file  . Highest education level: Not on file  Occupational History  . Occupation: Retired  Scientific laboratory technician  . Financial resource strain: Not on file  . Food insecurity:    Worry: Not on file    Inability: Not on file  . Transportation needs:    Medical: Not on file    Non-medical: Not on file  Tobacco Use  . Smoking status: Current Every Day Smoker     Packs/day: 0.50    Years: 57.00    Pack years: 28.50    Types: Cigarettes  . Smokeless tobacco: Never Used  . Tobacco comment: He's been smoking between 0.5 and 2 ppd since age 70.  Substance and Sexual Activity  . Alcohol use: No    Alcohol/week: 0.0 standard drinks    Comment: "quit drinking in 1986"  . Drug use: No  . Sexual activity: Never  Lifestyle  . Physical activity:    Days per week: Not on file    Minutes per session: Not on file  . Stress: Not on file  Relationships  . Social connections:    Talks on phone: Not on file    Gets together: Not on file    Attends religious service: Not on file    Active member of club or organization:  Not on file    Attends meetings of clubs or organizations: Not on file    Relationship status: Not on file  . Intimate partner violence:    Fear of current or ex partner: Not on file    Emotionally abused: Not on file    Physically abused: Not on file    Forced sexual activity: Not on file  Other Topics Concern  . Not on file  Social History Narrative   He is a widower, who moved to [place] in 2011 to live closer to his daughter. He formerly lived in [place]. He is a father of 25, grandfather, and great-grandfather of 52. Does not drink alcohol, context of smoker.    Review of Systems: Respiratory: Negative for cough, choking, chest tightness, shortness of breath, wheezing and stridor.  Cardiovascular: Negative for chest pain, palpitations and leg swelling. Gastrointestinal: Negative for abdominal distention. Genitourinary: See HPI  Neurological: Negative for dizziness, tremors, seizures, syncope, facial asymmetry, speech difficulty, weakness, light-headedness, numbness and headaches.  Psychiatric/Behavioral: Negative for hallucinations, behavioral problems, confusion, dysphoric mood, decreased concentration and agitation.   Objective:   Vitals:   04/04/18 0836  BP: 138/75  Pulse: 88  Resp: 17  SpO2: 98%    BP  Readings from Last 3 Encounters:  04/03/18 106/62  03/31/18 133/66  02/27/18 126/78    There were no vitals filed for this visit.    Physical Exam: Constitutional: Patient appears well-developed and well-nourished. No distress. HENT: Normocephalic, atraumatic, External right and left ear normal. Oropharynx is clear and moist.  Eyes: Conjunctivae and EOM are normal. PERRLA, no scleral icterus. Neck: Normal ROM. Neck supple. No JVD. No tracheal deviation. No thyromegaly. CVS: RRR, S1/S2 +, no murmurs, no gallops, no carotid bruit.  Pulmonary: Effort and breath sounds normal, no stridor, rhonchi, wheezes, rales.  Abdominal: Soft. BS +, no distension, tenderness, rebound or guarding.  Musculoskeletal: Normal range of motion. No edema and no tenderness.  Neuro: Alert. Normal muscle tone coordination. Normal gait. BUE and BLE strength 5/5.  Skin: Skin is warm and dry. No rash noted. Not diaphoretic. No erythema. No pallor. Psychiatric: Normal mood and affect. Behavior, judgment, thought content normal.  Lab Results (prior encounters)  Lab Results  Component Value Date   WBC 4.2 04/03/2018   HGB 11.1 (L) 04/03/2018   HCT 36.6 (L) 04/03/2018   MCV 92.4 04/03/2018   PLT 291 04/03/2018   Lab Results  Component Value Date   CREATININE 0.91 04/03/2018   BUN 5 (L) 04/03/2018   NA 139 04/03/2018   K 3.7 04/03/2018   CL 106 04/03/2018   CO2 23 04/03/2018    Lab Results  Component Value Date   HGBA1C 5.6 11/08/2017       Component Value Date/Time   CHOL 82 11/09/2017 0040   TRIG 136 11/09/2017 0040   HDL 38 (L) 11/09/2017 0040   CHOLHDL 2.2 11/09/2017 0040   VLDL 27 11/09/2017 0040   LDLCALC 17 11/09/2017 0040        Assessment and plan:  1. Encounter to establish care 2. Penile discharge 3. Balanitis -Discontinue Flagyl and start keflex 1000 mg twice daily x 10 days. -adding Lotrisone cream BID for irritation  - WOUND CULTURE - Cervicovaginal ancillary only If no  improvement, referring to dermatology for second opinion   4. Increased urinary frequency 5. BPH with obstruction/lower urinary tract symptoms -Continue Flomax 0.4 mg as prescribed. Will refer to urology for second opinion and further evaluation given  symptoms present for over 4 years and are currently worsening.  - Ambulatory referral to Urology  6. Diabetes, controlled, with skin complications  -decrease metformin 1000 mg once daily -repeat A1C at next follow-up    Orders Placed This Encounter  Procedures  . WOUND CULTURE    Order Specific Question:   Source    Answer:   foreskin lesion  . Ambulatory referral to Urology    Referral Priority:   Routine    Referral Type:   Consultation    Referral Reason:   Specialty Services Required    Requested Specialty:   Urology    Number of Visits Requested:   1    Return in about 10 days (around 04/14/2018).   The patient was given clear instructions to go to ER or return to medical center if symptoms don't improve, worsen or new problems develop. The patient verbalized understanding. The patient was advised  to call and obtain lab results if they haven't heard anything from out office within 7-10 business days.  Molli Barrows, FNP Primary Care at [facility] [address], [place] [address] [phone]: [phone]    This note has been created with Dragon speech recognition software and Engineer, materials. Any transcriptional errors are unintentional.

## 2018-04-05 ENCOUNTER — Telehealth: Payer: Self-pay | Admitting: Family Medicine

## 2018-04-05 ENCOUNTER — Telehealth (HOSPITAL_COMMUNITY): Payer: Self-pay | Admitting: *Deleted

## 2018-04-05 LAB — CERVICOVAGINAL ANCILLARY ONLY
Candida vaginitis: NEGATIVE
Chlamydia: NEGATIVE
Neisseria Gonorrhea: NEGATIVE
Trichomonas: NEGATIVE

## 2018-04-05 NOTE — Telephone Encounter (Signed)
Follow-up and ensure that he is no longer taking Flagyl with the keflex.Given recent side effects I would like for him to stop Kelfex. Continue topical cream which I prescribed. I will wait for results of cultures I collected during his visit and re-prescribed medication at that time. I don't want to keep exposing him to multiple antibiotic as this predisposes him to GI problems.  Will follow-up once cultures are returned.

## 2018-04-05 NOTE — Telephone Encounter (Signed)
Patient notified of this information. Expressed understanding. 

## 2018-04-05 NOTE — Telephone Encounter (Signed)
Patient called stating that Keflex medication that was prescribed to him gave him many side effects, states he was not able to sleep from diarrhea, stated his BP went down very low, and that he felt dizzy many times throughout the night. Patient would like to know if he should take half or if there is another alternative to the medication. Please follow up.

## 2018-04-05 NOTE — Telephone Encounter (Signed)
Patient called and left message on voicemail stating he needed to have the pacemaker procedure done but will need assistance with care when discharged. Will forward to Dr. Curt Bears nurse Sherri for further workup.

## 2018-04-05 NOTE — Telephone Encounter (Signed)
Left message informing pt that I would discuss w/ Camnitz next week and call him.

## 2018-04-07 LAB — WOUND CULTURE

## 2018-04-08 ENCOUNTER — Telehealth: Payer: Self-pay | Admitting: Cardiology

## 2018-04-08 ENCOUNTER — Encounter (HOSPITAL_COMMUNITY): Payer: Self-pay | Admitting: Internal Medicine

## 2018-04-08 ENCOUNTER — Emergency Department (HOSPITAL_COMMUNITY): Payer: Medicare Other

## 2018-04-08 ENCOUNTER — Emergency Department (HOSPITAL_COMMUNITY)
Admission: EM | Admit: 2018-04-08 | Discharge: 2018-04-08 | Disposition: A | Discharge status: Home / Self Care | Payer: Medicare Other | Attending: Emergency Medicine | Admitting: Emergency Medicine

## 2018-04-08 DIAGNOSIS — J449 Chronic obstructive pulmonary disease, unspecified: Secondary | ICD-10-CM | POA: Diagnosis not present

## 2018-04-08 DIAGNOSIS — F419 Anxiety disorder, unspecified: Secondary | ICD-10-CM | POA: Diagnosis not present

## 2018-04-08 DIAGNOSIS — F17210 Nicotine dependence, cigarettes, uncomplicated: Secondary | ICD-10-CM | POA: Insufficient documentation

## 2018-04-08 DIAGNOSIS — I4819 Other persistent atrial fibrillation: Secondary | ICD-10-CM

## 2018-04-08 DIAGNOSIS — R002 Palpitations: Secondary | ICD-10-CM | POA: Insufficient documentation

## 2018-04-08 DIAGNOSIS — Z7982 Long term (current) use of aspirin: Secondary | ICD-10-CM | POA: Insufficient documentation

## 2018-04-08 DIAGNOSIS — R001 Bradycardia, unspecified: Secondary | ICD-10-CM | POA: Diagnosis not present

## 2018-04-08 DIAGNOSIS — R079 Chest pain, unspecified: Secondary | ICD-10-CM | POA: Insufficient documentation

## 2018-04-08 DIAGNOSIS — D129 Benign neoplasm of anus and anal canal: Secondary | ICD-10-CM | POA: Insufficient documentation

## 2018-04-08 DIAGNOSIS — I5032 Chronic diastolic (congestive) heart failure: Secondary | ICD-10-CM | POA: Diagnosis not present

## 2018-04-08 DIAGNOSIS — I2510 Atherosclerotic heart disease of native coronary artery without angina pectoris: Secondary | ICD-10-CM | POA: Diagnosis not present

## 2018-04-08 DIAGNOSIS — E119 Type 2 diabetes mellitus without complications: Secondary | ICD-10-CM | POA: Insufficient documentation

## 2018-04-08 DIAGNOSIS — D128 Benign neoplasm of rectum: Secondary | ICD-10-CM | POA: Insufficient documentation

## 2018-04-08 DIAGNOSIS — Z7984 Long term (current) use of oral hypoglycemic drugs: Secondary | ICD-10-CM | POA: Diagnosis not present

## 2018-04-08 DIAGNOSIS — R0789 Other chest pain: Secondary | ICD-10-CM | POA: Diagnosis not present

## 2018-04-08 DIAGNOSIS — I110 Hypertensive heart disease with heart failure: Secondary | ICD-10-CM | POA: Diagnosis not present

## 2018-04-08 DIAGNOSIS — Z79899 Other long term (current) drug therapy: Secondary | ICD-10-CM | POA: Insufficient documentation

## 2018-04-08 LAB — CULTURE, BLOOD (ROUTINE X 2)
Culture: NO GROWTH
Culture: NO GROWTH
Special Requests: ADEQUATE
Special Requests: ADEQUATE

## 2018-04-08 LAB — URINALYSIS, ROUTINE W REFLEX MICROSCOPIC
Bilirubin Urine: NEGATIVE
Glucose, UA: NEGATIVE mg/dL
Hgb urine dipstick: NEGATIVE
Ketones, ur: NEGATIVE mg/dL
Nitrite: NEGATIVE
Protein, ur: NEGATIVE mg/dL
Specific Gravity, Urine: 1.008 (ref 1.005–1.030)
pH: 8 (ref 5.0–8.0)

## 2018-04-08 LAB — COMPREHENSIVE METABOLIC PANEL
ALT: 36 U/L (ref 0–44)
AST: 24 U/L (ref 15–41)
Albumin: 3.3 g/dL — ABNORMAL LOW (ref 3.5–5.0)
Alkaline Phosphatase: 51 U/L (ref 38–126)
Anion gap: 10 (ref 5–15)
BUN: 9 mg/dL (ref 8–23)
CO2: 24 mmol/L (ref 22–32)
Calcium: 9.5 mg/dL (ref 8.9–10.3)
Chloride: 104 mmol/L (ref 98–111)
Creatinine, Ser: 0.87 mg/dL (ref 0.61–1.24)
GFR calc Af Amer: >60 mL/min (ref >60)
GFR calc non Af Amer: >60 mL/min (ref >60)
Glucose, Bld: 169 mg/dL — ABNORMAL HIGH (ref 70–99)
Potassium: 3.4 mmol/L — ABNORMAL LOW (ref 3.5–5.1)
Sodium: 138 mmol/L (ref 135–145)
TOTAL PROTEIN: 5.9 g/dL — AB (ref 6.5–8.1)
Total Bilirubin: 0.4 mg/dL (ref 0.3–1.2)

## 2018-04-08 LAB — CBC WITH DIFFERENTIAL/PLATELET
Abs Immature Granulocytes: 0.02 10*3/uL (ref 0.00–0.07)
Basophils Absolute: 0 10*3/uL (ref 0.0–0.1)
Basophils Relative: 1 %
Eosinophils Absolute: 0.1 10*3/uL (ref 0.0–0.5)
Eosinophils Relative: 1 %
HCT: 33.5 % — ABNORMAL LOW (ref 39.0–52.0)
Hemoglobin: 10.5 g/dL — ABNORMAL LOW (ref 13.0–17.0)
Immature Granulocytes: 0 %
Lymphocytes Relative: 10 %
Lymphs Abs: 0.6 10*3/uL — ABNORMAL LOW (ref 0.7–4.0)
MCH: 28.8 pg (ref 26.0–34.0)
MCHC: 31.3 g/dL (ref 30.0–36.0)
MCV: 91.8 fL (ref 80.0–100.0)
MONOS PCT: 11 %
Monocytes Absolute: 0.6 10*3/uL (ref 0.1–1.0)
NEUTROS PCT: 77 %
NRBC: 0 % (ref 0.0–0.2)
Neutro Abs: 4.3 10*3/uL (ref 1.7–7.7)
Platelets: 328 10*3/uL (ref 150–400)
RBC: 3.65 MIL/uL — ABNORMAL LOW (ref 4.22–5.81)
RDW: 14.6 % (ref 11.5–15.5)
WBC: 5.6 10*3/uL (ref 4.0–10.5)

## 2018-04-08 LAB — MAGNESIUM: Magnesium: 1.7 mg/dL (ref 1.7–2.4)

## 2018-04-08 LAB — TROPONIN I

## 2018-04-08 NOTE — Telephone Encounter (Signed)
Patient c/o Palpitations:  High priority if patient c/o lightheadedness, shortness of breath, or chest pain  1) How long have you had palpitations/irregular HR/ Afib? Are you having the symptoms now? Yes 4 Days and night   2) Are you currently experiencing lightheadedness, SOB or CP? SOB some lightheadedness  3) Do you have a history of afib (atrial fibrillation) or irregular heart rhythm? Yes  4) Have you checked your BP or HR? (document readings if available): some   5) Are you experiencing any other symptoms? Yes

## 2018-04-08 NOTE — Telephone Encounter (Signed)
Pt calling to let us know he has been in Afib for three days. Today his HR > 150 sustained. He states he has called EMS to take him to [facility] and he wanted Dr Curt Bears to know. I advised pt that I agreed with him as he is symptomatic and has a sustained high HR. I will forward to Dr Curt Bears and his RN for review.

## 2018-04-08 NOTE — ED Provider Notes (Signed)
Patient eloped from the ED after seeing cardiology.  Cardiology note says that patient was supposed to be scheduled for pacemaker but he did not show up.  They wanted him to have social work come and talk to them.  But patient eloped from the ED prior to this happening.   Lennice Sites, DO 04/08/18 1623

## 2018-04-08 NOTE — ED Notes (Signed)
Pt. Would not wait for discharge paperwork, left without.

## 2018-04-08 NOTE — Consult Note (Addendum)
Cardiology consult   Patient ID: [name] MRN: [id number]; DOB: [birth year]   Admission date: 04/08/2018  Primary Care Provider: Scot Jun, FNP Primary Cardiologist: Glenetta Hew, MD  Primary Electrophysiologist:  Will Meredith Leeds, MD   Chief Complaint:  AFib w/RVR  Patient Profile:   [name] is a 78 y.o. male with a hx of COPD, GIB, chronic CHF (diastolic), arthritis, DM, HTN, HLD, PAFib/flutter (atypical), CAD sought attention for several days of palpitations, fast HR's  History of Present Illness:   [name] has had numerous ER visit overt he last several months for various reasons, 01/18/18 for AFib w/RVR, he was admitted with vague rambling physical complaints, noted his SBP high 8-0's-90's, HR 130's.  He is not a/c 2/2 GIB history With improvement in his BP was started on dilt gtt and then titrated PO dosing, notes report bradycardia to 40's-50's (one pause was mentioned of 2.5seconds Pt was concerned about low iron levels, his Hgb was low but stable and recommended to keep his out patient follow up in place for this.  He was discharged on Dilt 240mg  daily Follow up out patient with Dr Curt Bears with few palpitations complaints it seems, one visit discussed using infrequently a PRN dose of 30mg  of the dilt, 02/19/18 his dilt was increased to 300mg  daily, and on 12/4 discussed pacer for likely tachy-brady, and discussed possibly in the future if post pacing unable to rate control an AV node ablation. It seems he was planend for PPM, though cancelled by the patient (unclear why)  He has had 2 ER visits this month one on 1/5, with penile discharge burning, treated with fluconazole by his PMD though continued rx with antibiotics and referred back to his PMD On 1/8 came with fever (101.6 by EMS) his HR 106 by EKG in AFib, discharged after fluids and afebrile in ER  Returns today via EMS with patient reports of days of tachycardia He is afebrile, good BP, and  arrives in SR  LABS K+ 3.4 BUN/Creat 9/0.87 Mag 1.7 WBC 5.6 H/H 10.5/33.5 Plts 328 Trop I: <0.03   Past Medical History:  Diagnosis Date  . Allergic rhinitis   . Anemia    takes Ferrous Sulfate daily  . Arthritis    "all over"  . Atrial flutter ([place])   . Balance problem 01/2014  . CAP (community acquired pneumonia) 09/18/2014  . Cervical radiculopathy due to degenerative joint disease of spine   . Chronic coronary artery disease    a. 03/2010 Nonocclusive disease by cath, performed for ST elevations on ECG;  b. 06/2013 Lexi MV: EF 60%, no ischemia.  [name] COPD (chronic obstructive pulmonary disease) ([place])   . Diabetes mellitus type II    takes Metformin and Lantus daily  . Diastolic CHF, chronic ([place])    a. 12/2012 EF 55-60%, diast dysfxn, triv MR, mildly dil LA/RA.  [name] Essential hypertension    takes Diltiazem daily  . Frail elderly   . GERD (gastroesophageal reflux disease)   . History of blood transfusion 1982   "when I had stomach OR"  . History of bronchitis    1998  . History of CVA (cerebrovascular accident)   . History of gastric ulcer   . Hypercholesterolemia    takes Pravastatin daily  . Intrinsic eczema   . Legally blind in left eye, as defined in [place]   . Obesity   . PAF (paroxysmal atrial fibrillation) ([place])    Recurrent after atrial flutter (a. 07/2010 Status post  caval tricuspid isthmus ablation by Dr. Midge Aver Metoprolol daily), currently controlled on flecainide plus diltiazem and Coumadin  . Vitamin D deficiency   . Weakness    numbness and tingling both hands    Past Surgical History:  Procedure Laterality Date  . ATRIAL ABLATION SURGERY  08/05/10   CTI ablation for atrial flutter by JA  . CARDIAC CATHETERIZATION  2012   nl LV function, no occlusive CAD, PAF  . CARDIOVERSION  12/07/2010    Successful direct current cardioversion with atrial fibrillation to normal sinus rhythm  . CARPAL TUNNEL RELEASE Bilateral 01/30/2014   Procedure: BILATERAL  CARPAL TUNNEL RELEASE;  Surgeon: Marianna Payment, MD;  Location: [facility];  Service: Orthopedics;  Laterality: Bilateral;  . CATARACT EXTRACTION W/ INTRAOCULAR LENS  IMPLANT, BILATERAL Bilateral   . COLONOSCOPY [name] 12/02/2013   Procedure: COLONOSCOPY;  Surgeon: Irene Shipper, MD;  Location: [facility];  Service: Endoscopy;  Laterality: [name];  . ESOPHAGOGASTRODUODENOSCOPY [name] 09/22/2014   Procedure: ESOPHAGOGASTRODUODENOSCOPY (EGD);  Surgeon: Ronald Lobo, MD;  Location[facility] ENDOSCOPY;  Service: Endoscopy;  Laterality: [name];  . ESOPHAGOGASTRODUODENOSCOPY [name] 07/11/2016   Procedure: ESOPHAGOGASTRODUODENOSCOPY (EGD);  Surgeon: Doran Stabler, MD;  [facility];  Service: Endoscopy;  Laterality: [name];  . INCISION AND DRAINAGE ABSCESS / HEMATOMA OF BURSA / KNEE / THIGH Left 1998   knee  . KNEE BURSECTOMY Left 1998  . LAPAROSCOPIC CHOLECYSTECTOMY  03/2010  . LEFT HEART CATH AND CORONARY ANGIOGRAPHY [name] 10/05/2017   Procedure: LEFT HEART CATH AND CORONARY ANGIOGRAPHY;  Surgeon: Martinique, Peter M, MD;  Location: [facility];  Service: Cardiovascular;  Laterality: [name];  . NM MYOVIEW LTD  07/22/2013   Normal EF ~60%, no ischemia or infarction.  [name] PARTIAL GASTRECTOMY  1982   subtotal; "took out 30% for ulcers"  . TRANSTHORACIC ECHOCARDIOGRAM  02/16/2014   EF 60%, no RWMA. - otherwise normal  . YAG LASER APPLICATION Bilateral      Medications Prior to Admission: Prior to Admission medications   Medication Sig Start Date End Date Taking? Authorizing Provider  albuterol (PROVENTIL HFA;VENTOLIN HFA) 108 (90 Base) MCG/ACT inhaler Inhale 2 puffs into the lungs every 4 (four) hours as needed for wheezing or shortness of breath. 10/30/17   Palumbo, April, MD  aspirin EC 81 MG tablet Take 81 mg by mouth daily.    [provider]  cephALEXin (KEFLEX) 500 MG capsule Take 2 capsules (1,000 mg total) by mouth 2 (two) times daily for 10 days. 04/04/18 04/14/18  Scot Jun, FNP    clotrimazole-betamethasone (LOTRISONE) cream Apply 1 application topically 2 (two) times daily. 04/04/18   Scot Jun, FNP  co-enzyme Q-10 30 MG capsule Take 30 mg by mouth daily.     [provider]  diclofenac sodium (VOLTAREN) 1 % GEL Apply 4 g topically 3 (three) times daily as needed (for back pain). 11/10/17   Rory Percy, DO  diltiazem (CARDIZEM CD) 300 MG 24 hr capsule Take 1 capsule (300 mg total) by mouth daily. 02/19/18   Camnitz, Will Hassell Done, MD  diltiazem (CARDIZEM) 30 MG tablet TAKE 1 TABLET BY MOUTH 4 TIMES DAILY AS NEEDED FOR AFIB 03/29/18   Camnitz, Ocie Doyne, MD  ferrous sulfate 325 (65 FE) MG tablet Take 1 tablet by mouth 4 (four) times a week. Takes M/T/W/F 02/14/18   [provider]  glucose blood (PRECISION QID TEST) test strip Check glucose twice daily 10/24/13   [provider]  hydrocortisone cream  1 % Apply to affected area 2 times daily 12/24/17   Maczis, Barth Kirks, PA-C  magnesium hydroxide (DULCOLAX MILK OF MAGNESIA) 400 MG/5ML suspension Take 5 mLs by mouth daily as needed for mild constipation.    [provider]  Melatonin 3 MG TABS Take 1 tablet (3 mg total) by mouth at bedtime. 11/16/17   Anderson, Chelsey L, DO  metFORMIN (GLUCOPHAGE) 1000 MG tablet Take 1,000 mg by mouth daily.    [provider]  Multiple Vitamin (MULTIVITAMIN WITH MINERALS) TABS tablet Take 1 tablet by mouth 2 (two) times daily.     [provider]  nitroGLYCERIN (NITROSTAT) 0.4 MG SL tablet Place 1 tablet (0.4 mg total) under the tongue every 5 (five) minutes x 3 doses as needed for chest pain. 11/19/15   Erlene Quan, PA-C  nystatin cream (MYCOSTATIN) Apply to affected area 2 times daily 01/02/18   Deno Etienne, DO  pantoprazole (PROTONIX) 40 MG tablet Take 1 tablet (40 mg total) by mouth 2 (two) times daily before a meal. 11/30/16   Domenic Polite, MD  pravastatin (PRAVACHOL) 40 MG tablet Take 1 tablet (40 mg total) by mouth every  evening. 05/04/17   Leonie Man, MD  tamsulosin (FLOMAX) 0.4 MG CAPS capsule Take 0.4 mg by mouth daily. 02/20/17   [provider]  vitamin C (ASCORBIC ACID) 250 MG tablet Take 1 tablet (250 mg total) by mouth daily. Patient taking differently: Take 250 mg by mouth 3 (three) times a week.  01/20/18   Rise Mu, PA-C     Allergies:    Allergies  Allergen Reactions  . Cyclobenzaprine Other (See Comments)    Unsteady gait    Social History:   Social History   Socioeconomic History  . Marital status: Widowed    Spouse name: Not on file  . Number of children: Not on file  . Years of education: Not on file  . Highest education level: Not on file  Occupational History  . Occupation: Retired  Scientific laboratory technician  . Financial resource strain: Not on file  . Food insecurity:    Worry: Not on file    Inability: Not on file  . Transportation needs:    Medical: Not on file    Non-medical: Not on file  Tobacco Use  . Smoking status: Current Every Day Smoker    Packs/day: 0.50    Years: 57.00    Pack years: 28.50    Types: Cigarettes  . Smokeless tobacco: Never Used  . Tobacco comment: He's been smoking between 0.5 and 2 ppd since age 54.  Substance and Sexual Activity  . Alcohol use: No    Alcohol/week: 0.0 standard drinks    Comment: "quit drinking in 1986"  . Drug use: No  . Sexual activity: Never  Lifestyle  . Physical activity:    Days per week: Not on file    Minutes per session: Not on file  . Stress: Not on file  Relationships  . Social connections:    Talks on phone: Not on file    Gets together: Not on file    Attends religious service: Not on file    Active member of club or organization: Not on file    Attends meetings of clubs or organizations: Not on file    Relationship status: Not on file  . Intimate partner violence:    Fear of current or ex partner: Not on file    Emotionally abused: Not on  file    Physically abused: Not on file    Forced  sexual activity: Not on file  Other Topics Concern  . Not on file  Social History Narrative   He is a widower, who moved to [place] in 2011 to live closer to his daughter. He formerly lived in [place]. He is a father of 27, grandfather, and great-grandfather of 69. Does not drink alcohol, context of smoker.    Family History:   The patient's family history includes Breast cancer in his mother; Diabetes in his mother; Heart attack in his brother and father; Heart disease in his father; Kidney disease in his mother; Leukemia in his daughter.    ROS:  Please see the history of present illness.  All other ROS reviewed and negative.     Physical Exam/Data:   Vitals:   04/08/18 1050 04/08/18 1115 04/08/18 1130 04/08/18 1145  BP: (!) 146/71 134/87 130/69 134/60  Pulse: 90 87 88 84  Resp: 20 (!) 21 15 18   Temp: 98.2 F (36.8 C)     TempSrc: Oral     SpO2: 97% 95% 97% 97%   No intake or output data in the 24 hours ending 04/08/18 1219 Last 3 Weights 04/04/2018 03/30/2018 02/27/2018  Weight (lbs) 158 lb 9.6 oz 159 lb 167 lb 6.4 oz  Weight (kg) 71.94 kg 72.122 kg 75.932 kg     There is no height or weight on file to calculate BMI.  General:  Well nourished, well developed, in no acute physical distress, appears anxious HEENT: normal Lymph: no adenopathy Neck: no JVD Endocrine:  No thryomegaly Vascular: No carotid bruits  Cardiac:  RRR; no murmurs, gallops or rubs Lungs: CTA b/l, no wheezing, rhonchi or rales  Abd: soft, nontender  Ext: no edema Musculoskeletal:  No deformities, age appropriate atrophy Skin: warm and dry  Neuro:  No focal abnormalities noted Psych:  Normal affect    EKG:  The ECG that was done today was personally reviewed and demonstrates  SR, 90bpm, 1st degree AVBlock, PR 27ms, PVCs  Relevant CV Studies:  10/05/17: TTE Study Conclusions - Left ventricle: The cavity size was normal. Wall thickness was   increased in a pattern of mild LVH. Systolic  function was normal.   The estimated ejection fraction was in the range of 55% to 60%. - Aortic valve: There was trivial regurgitation. - Mitral valve: There was mild regurgitation. - Left atrium: The atrium was mildly dilated.  10/05/17; LHC  Dist RCA lesion is 40% stenosed.  Ost 1st Sept lesion is 95% stenosed.  LV end diastolic pressure is normal.   1. Single vessel CAD involving the ostium of the first septal perforator. Otherwise nonobstructive disease. 2. Normal LVEDP    Laboratory Data:  Chemistry Recent Labs  Lab 04/03/18 1141  NA 139  K 3.7  CL 106  CO2 23  GLUCOSE 143*  BUN 5*  CREATININE 0.91  CALCIUM 8.5*  GFRNONAA >60  GFRAA >60  ANIONGAP 10    Recent Labs  Lab 04/03/18 1141  PROT 6.0*  ALBUMIN 3.2*  AST 31  ALT 25  ALKPHOS 65  BILITOT 0.4   Hematology Recent Labs  Lab 04/03/18 1141  WBC 4.2  RBC 3.96*  HGB 11.1*  HCT 36.6*  MCV 92.4  MCH 28.0  MCHC 30.3  RDW 14.7  PLT 291   Cardiac EnzymesNo results for input(s): TROPONINI in the last 168 hours.  Recent Labs  Lab 04/03/18 1149  TROPIPOC  0.01    BNP Recent Labs  Lab 04/03/18 1141  BNP 101.9*    DDimer No results for input(s): DDIMER in the last 168 hours.  Radiology/Studies:   Dg Chest 2 View Result Date: 04/08/2018 CLINICAL DATA:  Palpitations. EXAM: CHEST - 2 VIEW COMPARISON:  Radiographs of April 03, 2018. FINDINGS: The heart size and mediastinal contours are within normal limits. Both lungs are clear. The visualized skeletal structures are unremarkable. IMPRESSION: No active cardiopulmonary disease. Electronically Signed   By: Marijo Conception, M.D.   On: 04/08/2018 12:12    Assessment and Plan:   1. Persistent AFib     CHA2DS2Vasc is 5, not on a/c, per record 2/2 GIB's      Of late, felt likely to have tachy-brady, planned for PPM, though patient cancelled 2/2 concerns with ability to care for himself through the recovery period  recent fever? Still on  antibiotics??? Trace leukocytes on urine Wbc ok Afebrile here today Trouble with recurrent balanitis  He has maintained SR while in the ED (on a portable monitor without recording capability, though in d/w RN, only SR has been observed.  The patient states he feels fine here, when he gets up and starts doing things his HR gets fast.  Mentions that he needs to go home by 4PM to have time to get himself something to eat.  Dr. Caryl Comes discussed that he can be discharged from the ER, though suggests staying long enough to see if we can get a social worker to discuss his concerns about home issues but he insists he needs to go and can not wait.  I have updated Dr. Curt Bears, the patient will be discharged froim the ER, remaining in SR, asked he be put on his schedule in the office to revisit PPM I staff messaged Dr. Macky Lower scheduler to call the patient to make an appointment to see Dr. Curt Bears to re-discuss and plan for PPM implant.   For questions or updates, please contact [place] Please consult [email or web address] for contact info under        Signed, Baldwin Jamaica, PA-C  04/08/2018 12:19 PM    As above Pt with hx of AFib/flutter with RVR comes in with symptoms of tachypalpitations and is found in sinus rhtyhm  Dr. Curt Bears is thought is to consider pacing.  Not clearly indicated at this point so we will defer this to further evaluation.  As noted there are ongoing recurrent balanitis.  This should be addressed prior to device implantation as a superficial staph infection is of some concern.  Most importantly however is a social situation.  He alleges that his daughter says that she will change the locks if he goes to the hospital and has surgery as she will not be able to take care of him  He is glad to sign over his Social Security he says for being taken care of.  He was not able to stay today to speak to a Education officer, museum.  Thank you for the consultation.  Continue current  medications.

## 2018-04-08 NOTE — Telephone Encounter (Signed)
Follow up   Pt stated that he's returning a phone call from today  regarding palpitations. Please follow up .

## 2018-04-08 NOTE — ED Provider Notes (Signed)
[facility] EMERGENCY DEPARTMENT Provider Note   CSN: [id number] Arrival date & time: 04/08/18  1044     History   Chief Complaint Chief Complaint  Patient presents with  . Chest Pain    HPI Malone Vanblarcom is a 78 y.o. male.  HPI Patient with a history of proximal atrial fibrillation/flutter.  States over the last 3 to 4 days he has had multiple episodes and having difficulty sleeping at night.  States when he has the episodes that makes him anxious.  He denies any chest pain or shortness of breath.  Was supposed to have pacer placed last month but canceled due to concerns that he would not have enough rehab time.  Currently denying palpitations. Past Medical History:  Diagnosis Date  . Allergic rhinitis   . Anemia    takes Ferrous Sulfate daily  . Arthritis    "all over"  . Atrial flutter ([place])   . Balance problem 01/2014  . CAP (community acquired pneumonia) 09/18/2014  . Cervical radiculopathy due to degenerative joint disease of spine   . Chronic coronary artery disease    a. 03/2010 Nonocclusive disease by cath, performed for ST elevations on ECG;  b. 06/2013 Lexi MV: EF 60%, no ischemia.  [name] COPD (chronic obstructive pulmonary disease) ([place])   . Diabetes mellitus type II    takes Metformin and Lantus daily  . Diastolic CHF, chronic ([place])    a. 12/2012 EF 55-60%, diast dysfxn, triv MR, mildly dil LA/RA.  [name] Essential hypertension    takes Diltiazem daily  . Frail elderly   . GERD (gastroesophageal reflux disease)   . History of blood transfusion 1982   "when I had stomach OR"  . History of bronchitis    1998  . History of CVA (cerebrovascular accident)   . History of gastric ulcer   . Hypercholesterolemia    takes Pravastatin daily  . Intrinsic eczema   . Legally blind in left eye, as defined in [place]   . Obesity   . PAF (paroxysmal atrial fibrillation) (HCC)    Recurrent after atrial flutter (a. 07/2010 Status post caval tricuspid isthmus  ablation by Dr. Midge Aver Metoprolol daily), currently controlled on flecainide plus diltiazem and Coumadin  . Vitamin D deficiency   . Weakness    numbness and tingling both hands    Patient Active Problem List   Diagnosis Date Noted  . Rapid atrial fibrillation ([place]) 01/18/2018  . Atrial fibrillation with RVR ([place]) 11/08/2017  . Chest pain with moderate risk of acute coronary syndrome 10/04/2017  . HTN (hypertension) 05/21/2017  . Diabetes mellitus type II 05/21/2017  . Diastolic CHF, chronic ([place]) 05/21/2017  . COPD (chronic obstructive pulmonary disease) ([place]) 05/21/2017  . H/O atrial flutter 05/21/2017  . Coronary disease/nonobstructive 05/21/2017  . Acute blood loss anemia 10/02/2016  . Abdominal pain 10/02/2016  . Constipation 10/02/2016  . Left lower quadrant pain   . Coagulopathy ([place])   . Heme positive stool   . Symptomatic anemia 07/10/2016  . Syncope 05/15/2015  . Cervical disc disease 05/15/2015  . Abnormal urinalysis 05/15/2015  . Anemia 05/15/2015  . History of CVA (cerebrovascular accident) 04/03/2014  . Near syncope 02/15/2014  . Pre-syncope 02/15/2014  . Benign neoplasm of rectum and anal canal 12/02/2013  . Upper GI bleeding 11/30/2013  . Tobacco use disorder 11/30/2013  . Anticoagulation goal of INR 2 to 3, for PAF - CHA2DS2Vasc = 7 08/15/2013  . Type 2 diabetes mellitus ([place]) 01/22/2013  .  Tobacco abuse counseling 12/15/2012  . Hyperlipidemia   . Obesity (BMI 30-39.9) 12/14/2012  . Persistent atrial fibrillation ([place]) 10/07/2010  . Atrial flutter - status post CTI ablation 07/29/2010  . Essential hypertension 07/29/2010  . Chronic diastolic congestive heart failure ([place]) 07/29/2010  . Coronary artery disease, non-occlusive 07/29/2010    Past Surgical History:  Procedure Laterality Date  . ATRIAL ABLATION SURGERY  08/05/10   CTI ablation for atrial flutter by JA  . CARDIAC CATHETERIZATION  2012   nl LV function, no occlusive CAD, PAF  .  CARDIOVERSION  12/07/2010    Successful direct current cardioversion with atrial fibrillation to normal sinus rhythm  . CARPAL TUNNEL RELEASE Bilateral 01/30/2014   Procedure: BILATERAL CARPAL TUNNEL RELEASE;  Surgeon: Marianna Payment, MD;  Location: [facility];  Service: Orthopedics;  Laterality: Bilateral;  . CATARACT EXTRACTION W/ INTRAOCULAR LENS  IMPLANT, BILATERAL Bilateral   . COLONOSCOPY [name] 12/02/2013   Procedure: COLONOSCOPY;  Surgeon: Irene Shipper, MD;  Location: [facility];  Service: Endoscopy;  Laterality: [name];  . ESOPHAGOGASTRODUODENOSCOPY [name] 09/22/2014   Procedure: ESOPHAGOGASTRODUODENOSCOPY (EGD);  Surgeon: Ronald Lobo, MD;  Location: [facility] ENDOSCOPY;  Service: Endoscopy;  Laterality: [name];  . ESOPHAGOGASTRODUODENOSCOPY [name] 07/11/2016   Procedure: ESOPHAGOGASTRODUODENOSCOPY (EGD);  Surgeon: Doran Stabler, MD;  Location: [facility] ENDOSCOPY;  Service: Endoscopy;  Laterality: [name];  . INCISION AND DRAINAGE ABSCESS / HEMATOMA OF BURSA / KNEE / THIGH Left 1998   knee  . KNEE BURSECTOMY Left 1998  . LAPAROSCOPIC CHOLECYSTECTOMY  03/2010  . LEFT HEART CATH AND CORONARY ANGIOGRAPHY [name] 10/05/2017   Procedure: LEFT HEART CATH AND CORONARY ANGIOGRAPHY;  Surgeon: Martinique, Peter M, MD;  Location: [facility] CV LAB;  Service: Cardiovascular;  Laterality: [name];  . NM MYOVIEW LTD  07/22/2013   Normal EF ~60%, no ischemia or infarction.  [name] PARTIAL GASTRECTOMY  1982   subtotal; "took out 30% for ulcers"  . TRANSTHORACIC ECHOCARDIOGRAM  02/16/2014   EF 60%, no RWMA. - otherwise normal  . YAG LASER APPLICATION Bilateral         Home Medications    Prior to Admission medications   Medication Sig Start Date End Date Taking? Authorizing Provider  albuterol (PROVENTIL HFA;VENTOLIN HFA) 108 (90 Base) MCG/ACT inhaler Inhale 2 puffs into the lungs every 4 (four) hours as needed for wheezing or shortness of breath. 10/30/17   Palumbo, April, MD  aspirin EC 81 MG tablet Take 81 mg by mouth daily.     [provider]  cephALEXin (KEFLEX) 500 MG capsule Take 2 capsules (1,000 mg total) by mouth 2 (two) times daily for 10 days. 04/04/18 04/14/18  Scot Jun, FNP  clotrimazole-betamethasone (LOTRISONE) cream Apply 1 application topically 2 (two) times daily. 04/04/18   Scot Jun, FNP  co-enzyme Q-10 30 MG capsule Take 30 mg by mouth daily.     [provider]  diclofenac sodium (VOLTAREN) 1 % GEL Apply 4 g topically 3 (three) times daily as needed (for back pain). 11/10/17   Rory Percy, DO  diltiazem (CARDIZEM CD) 300 MG 24 hr capsule Take 1 capsule (300 mg total) by mouth daily. 02/19/18   Camnitz, Will Hassell Done, MD  diltiazem (CARDIZEM) 30 MG tablet TAKE 1 TABLET BY MOUTH 4 TIMES DAILY AS NEEDED FOR AFIB 03/29/18   Camnitz, Ocie Doyne, MD  ferrous sulfate 325 (65 FE) MG tablet Take 1 tablet by mouth 4 (four) times a week. Takes M/T/W/F 02/14/18   [provider]  glucose blood (PRECISION QID TEST) test strip Check glucose twice daily 10/24/13   [provider]  hydrocortisone cream 1 % Apply to affected area 2 times daily 12/24/17   Maczis, Barth Kirks, PA-C  magnesium hydroxide (DULCOLAX MILK OF MAGNESIA) 400 MG/5ML suspension Take 5 mLs by mouth daily as needed for mild constipation.    [provider]  Melatonin 3 MG TABS Take 1 tablet (3 mg total) by mouth at bedtime. 11/16/17   Anderson, Chelsey L, DO  metFORMIN (GLUCOPHAGE) 1000 MG tablet Take 1,000 mg by mouth daily.    [provider]  Multiple Vitamin (MULTIVITAMIN WITH MINERALS) TABS tablet Take 1 tablet by mouth 2 (two) times daily.     [provider]  nitroGLYCERIN (NITROSTAT) 0.4 MG SL tablet Place 1 tablet (0.4 mg total) under the tongue every 5 (five) minutes x 3 doses as needed for chest pain. 11/19/15   Erlene Quan, PA-C  nystatin cream (MYCOSTATIN) Apply to affected area 2 times daily 01/02/18   Deno Etienne, DO  pantoprazole (PROTONIX) 40 MG tablet Take 1  tablet (40 mg total) by mouth 2 (two) times daily before a meal. 11/30/16   Domenic Polite, MD  pravastatin (PRAVACHOL) 40 MG tablet Take 1 tablet (40 mg total) by mouth every evening. 05/04/17   Leonie Man, MD  tamsulosin (FLOMAX) 0.4 MG CAPS capsule Take 0.4 mg by mouth daily. 02/20/17   [provider]  vitamin C (ASCORBIC ACID) 250 MG tablet Take 1 tablet (250 mg total) by mouth daily. Patient taking differently: Take 250 mg by mouth 3 (three) times a week.  01/20/18   Rise Mu, PA-C    Family History Family History  Problem Relation Age of Onset  . Breast cancer Mother   . Diabetes Mother   . Kidney disease Mother   . Heart attack Father   . Heart disease Father   . Heart attack Brother   . Leukemia Daughter     Social History Social History   Tobacco Use  . Smoking status: Current Every Day Smoker    Packs/day: 0.50    Years: 57.00    Pack years: 28.50    Types: Cigarettes  . Smokeless tobacco: Never Used  . Tobacco comment: He's been smoking between 0.5 and 2 ppd since age 22.  Substance Use Topics  . Alcohol use: No    Alcohol/week: 0.0 standard drinks    Comment: "quit drinking in 1986"  . Drug use: No     Allergies   Cyclobenzaprine   Review of Systems Review of Systems  Constitutional: Negative for chills and fever.  HENT: Negative for trouble swallowing.   Eyes: Negative for visual disturbance.  Respiratory: Negative for cough and shortness of breath.   Cardiovascular: Positive for palpitations. Negative for chest pain.  Gastrointestinal: Negative for abdominal pain, constipation, diarrhea, nausea and vomiting.  Genitourinary: Negative for dysuria, flank pain, frequency and hematuria.  Musculoskeletal: Negative for back pain, myalgias and neck pain.  Skin: Negative for rash and wound.  Neurological: Negative for dizziness, weakness, light-headedness, numbness and headaches.  Psychiatric/Behavioral: The patient is nervous/anxious.     All other systems reviewed and are negative.    Physical Exam Updated Vital Signs BP (!) 105/49 (BP Location: Right Arm)   Pulse 89   Temp 98.2 F (36.8 C) (Oral)   Resp 18   SpO2 97%   Physical Exam Vitals signs and nursing note reviewed.  Constitutional:  Appearance: Normal appearance. He is well-developed.  HENT:     Head: Normocephalic and atraumatic.     Nose: Nose normal.     Mouth/Throat:     Mouth: Mucous membranes are moist.  Eyes:     Pupils: Pupils are equal, round, and reactive to light.  Neck:     Musculoskeletal: Normal range of motion and neck supple.  Cardiovascular:     Rate and Rhythm: Normal rate and regular rhythm.  Pulmonary:     Effort: Pulmonary effort is normal. No respiratory distress.     Breath sounds: Normal breath sounds. No stridor. No wheezing, rhonchi or rales.  Abdominal:     General: Bowel sounds are normal.     Palpations: Abdomen is soft.     Tenderness: There is no abdominal tenderness. There is no guarding or rebound.  Musculoskeletal: Normal range of motion.        General: No swelling, tenderness, deformity or signs of injury.     Right lower leg: No edema.     Left lower leg: No edema.  Skin:    General: Skin is warm and dry.     Capillary Refill: Capillary refill takes less than 2 seconds.     Findings: No erythema or rash.  Neurological:     General: No focal deficit present.     Mental Status: He is alert and oriented to person, place, and time.  Psychiatric:        Mood and Affect: Mood normal.        Behavior: Behavior normal.      ED Treatments / Results  Labs (all labs ordered are listed, but only abnormal results are displayed) Labs Reviewed  CBC WITH DIFFERENTIAL/PLATELET - Abnormal; Notable for the following components:      Result Value   RBC 3.65 (*)    Hemoglobin 10.5 (*)    HCT 33.5 (*)    Lymphs Abs 0.6 (*)    All other components within normal limits  COMPREHENSIVE METABOLIC PANEL -  Abnormal; Notable for the following components:   Potassium 3.4 (*)    Glucose, Bld 169 (*)    Total Protein 5.9 (*)    Albumin 3.3 (*)    All other components within normal limits  URINALYSIS, ROUTINE W REFLEX MICROSCOPIC - Abnormal; Notable for the following components:   APPearance CLOUDY (*)    Leukocytes, UA TRACE (*)    Bacteria, UA FEW (*)    All other components within normal limits  TROPONIN I  MAGNESIUM    EKG EKG Interpretation  Date/Time:  Monday April 08 2018 10:47:33 EST Ventricular Rate:  90 PR Interval:    QRS Duration: 90 QT Interval:  356 QTC Calculation: 436 R Axis:   11 Text Interpretation:  Sinus rhythm Ventricular bigeminy Prolonged PR interval ST elevation, consider inferior injury Confirmed by Julianne Rice [phone]) on 04/09/2018 7:15:08 AM   Radiology Dg Chest 2 View  Result Date: 04/08/2018 CLINICAL DATA:  Palpitations. EXAM: CHEST - 2 VIEW COMPARISON:  Radiographs of April 03, 2018. FINDINGS: The heart size and mediastinal contours are within normal limits. Both lungs are clear. The visualized skeletal structures are unremarkable. IMPRESSION: No active cardiopulmonary disease. Electronically Signed   By: Marijo Conception, M.D.   On: 04/08/2018 12:12    Procedures Procedures (including critical care time)  Medications Ordered in ED Medications - No data to display   Initial Impression / Assessment and Plan / ED Course  I  have reviewed the triage vital signs and the nursing notes.  Pertinent labs & imaging results that were available during my care of the patient were reviewed by me and considered in my medical decision making (see chart for details).    Cussed with cardiology who will evaluate and disposition.  Patient is well-appearing.  Vital signs are stable.  Remains in normal sinus rhythm.   Final Clinical Impressions(s) / ED Diagnoses   Final diagnoses:  Chest pain, unspecified type    ED Discharge Orders    None        Julianne Rice, MD 04/09/18 [phone]

## 2018-04-08 NOTE — ED Triage Notes (Signed)
Pt BIB [facility] from home c/o chest pain and "heart racing" since last night. Given 324 asa by EMS. Refused nitro. Called cardiologist and they stated pt should come to ED. VSS for EMS.

## 2018-04-09 NOTE — Telephone Encounter (Signed)
Follow up:    Patient returning call back. Patient states he will be at home.

## 2018-04-09 NOTE — Telephone Encounter (Signed)
Pt is scheduled w/ Camnitz on 1/21, next week, to discuss/re-schedule PPM. Pt aware I will call his dtr in the morning to ensure a time she is able to come to the appt. Pt appreciates the call.

## 2018-04-10 ENCOUNTER — Inpatient Hospital Stay: Payer: Medicare Other | Admitting: Family

## 2018-04-10 ENCOUNTER — Inpatient Hospital Stay: Payer: Medicare Other | Attending: Hematology & Oncology

## 2018-04-10 ENCOUNTER — Other Ambulatory Visit: Payer: Self-pay | Admitting: Family Medicine

## 2018-04-10 MED ORDER — CIPROFLOXACIN HCL 500 MG PO TABS: 500.0000 mg | ORAL_TABLET | Freq: Two times a day (BID) | ORAL | 0 refills | Status: AC

## 2018-04-10 NOTE — Telephone Encounter (Signed)
Phone was answered & then hung up.

## 2018-04-10 NOTE — Telephone Encounter (Signed)
Patient notified of results & recommendations. Will return to office as scheduled.

## 2018-04-10 NOTE — Telephone Encounter (Signed)
Contact patient to advise culture of his penile gland was significant for bacteria which is susceptible to ciprofloxacin . I would like for him to start Ciprofloxacin 500 mg twice daily 7 days. He should keep scheduled follow-up.

## 2018-04-11 NOTE — Telephone Encounter (Signed)
Spoke to pt's dtr.  She is willing to come in w/ pt next Tuesday to discuss plan of care and discuss PPM implant. appt made for 1/21 @ 8am

## 2018-04-12 ENCOUNTER — Telehealth: Payer: Self-pay | Admitting: Cardiology

## 2018-04-12 ENCOUNTER — Encounter (HOSPITAL_COMMUNITY): Payer: Self-pay | Admitting: Emergency Medicine

## 2018-04-12 ENCOUNTER — Emergency Department (HOSPITAL_COMMUNITY): Payer: Medicare Other

## 2018-04-12 ENCOUNTER — Emergency Department (HOSPITAL_COMMUNITY)
Admission: EM | Admit: 2018-04-12 | Discharge: 2018-04-12 | Disposition: A | Discharge status: Home / Self Care | Payer: Medicare Other | Attending: Emergency Medicine | Admitting: Emergency Medicine

## 2018-04-12 DIAGNOSIS — I480 Paroxysmal atrial fibrillation: Secondary | ICD-10-CM | POA: Diagnosis not present

## 2018-04-12 DIAGNOSIS — I5032 Chronic diastolic (congestive) heart failure: Secondary | ICD-10-CM | POA: Insufficient documentation

## 2018-04-12 DIAGNOSIS — E119 Type 2 diabetes mellitus without complications: Secondary | ICD-10-CM | POA: Diagnosis not present

## 2018-04-12 DIAGNOSIS — Z79899 Other long term (current) drug therapy: Secondary | ICD-10-CM | POA: Insufficient documentation

## 2018-04-12 DIAGNOSIS — F17210 Nicotine dependence, cigarettes, uncomplicated: Secondary | ICD-10-CM | POA: Diagnosis not present

## 2018-04-12 DIAGNOSIS — I110 Hypertensive heart disease with heart failure: Secondary | ICD-10-CM | POA: Insufficient documentation

## 2018-04-12 DIAGNOSIS — Z7984 Long term (current) use of oral hypoglycemic drugs: Secondary | ICD-10-CM | POA: Insufficient documentation

## 2018-04-12 DIAGNOSIS — Z7982 Long term (current) use of aspirin: Secondary | ICD-10-CM | POA: Diagnosis not present

## 2018-04-12 DIAGNOSIS — R000 Tachycardia, unspecified: Secondary | ICD-10-CM | POA: Diagnosis not present

## 2018-04-12 DIAGNOSIS — Z8673 Personal history of transient ischemic attack (TIA), and cerebral infarction without residual deficits: Secondary | ICD-10-CM | POA: Insufficient documentation

## 2018-04-12 DIAGNOSIS — I2510 Atherosclerotic heart disease of native coronary artery without angina pectoris: Secondary | ICD-10-CM | POA: Insufficient documentation

## 2018-04-12 DIAGNOSIS — E7800 Pure hypercholesterolemia, unspecified: Secondary | ICD-10-CM | POA: Diagnosis not present

## 2018-04-12 DIAGNOSIS — R079 Chest pain, unspecified: Secondary | ICD-10-CM | POA: Diagnosis not present

## 2018-04-12 DIAGNOSIS — J449 Chronic obstructive pulmonary disease, unspecified: Secondary | ICD-10-CM | POA: Diagnosis not present

## 2018-04-12 DIAGNOSIS — R0602 Shortness of breath: Secondary | ICD-10-CM | POA: Diagnosis not present

## 2018-04-12 DIAGNOSIS — R0789 Other chest pain: Secondary | ICD-10-CM | POA: Diagnosis not present

## 2018-04-12 DIAGNOSIS — I4891 Unspecified atrial fibrillation: Secondary | ICD-10-CM | POA: Diagnosis not present

## 2018-04-12 LAB — CBC
HCT: 37.6 % — ABNORMAL LOW (ref 39.0–52.0)
HEMOGLOBIN: 11.6 g/dL — AB (ref 13.0–17.0)
MCH: 28.1 pg (ref 26.0–34.0)
MCHC: 30.9 g/dL (ref 30.0–36.0)
MCV: 91 fL (ref 80.0–100.0)
Platelets: 385 10*3/uL (ref 150–400)
RBC: 4.13 MIL/uL — ABNORMAL LOW (ref 4.22–5.81)
RDW: 14.2 % (ref 11.5–15.5)
WBC: 7.6 10*3/uL (ref 4.0–10.5)
nRBC: 0 % (ref 0.0–0.2)

## 2018-04-12 LAB — BASIC METABOLIC PANEL
Anion gap: 12 (ref 5–15)
BUN: 10 mg/dL (ref 8–23)
CHLORIDE: 106 mmol/L (ref 98–111)
CO2: 23 mmol/L (ref 22–32)
Calcium: 9.4 mg/dL (ref 8.9–10.3)
Creatinine, Ser: 0.94 mg/dL (ref 0.61–1.24)
GFR calc Af Amer: >60 mL/min (ref >60)
GFR calc non Af Amer: >60 mL/min (ref >60)
Glucose, Bld: 224 mg/dL — ABNORMAL HIGH (ref 70–99)
Potassium: 3.2 mmol/L — ABNORMAL LOW (ref 3.5–5.1)
Sodium: 141 mmol/L (ref 135–145)

## 2018-04-12 LAB — I-STAT TROPONIN, ED: Troponin i, poc: 0 ng/mL (ref 0.00–0.08)

## 2018-04-12 MED ORDER — SODIUM CHLORIDE 0.9% FLUSH: 3.0000 mL | Freq: Once | INTRAVENOUS | Status: DC

## 2018-04-12 NOTE — ED Provider Notes (Signed)
[facility] EMERGENCY DEPARTMENT Provider Note   CSN: [id number] Arrival date & time: 04/12/18  1150     History   Chief Complaint Chief Complaint  Patient presents with  . Chest Pain    HPI [name] is a 78 y.o. male.  HPI    78 year old male with a past medical history of COPD, GIB, chronic CHF, arthritis, diabetes, hypertension, hyperlipidemia, P A. fib/flutter, CAD presents today with complaints of palpitations.  Patient has been seen several times for the more presentation.  He was most recently seen on 04/08/2018 with cardiology evaluation at bedside.  Patient was planned to have PPM but canceled as he did not feel he would be able to take care of himself.  He has an appointment this upcoming Tuesday, January 21.    He notes that his heart continues to race when he gets up and is active, he notes this also happens out of the blue.  Patient notes it was happening today despite taking his medication at home.  He denies any associated chest pain or shortness of breath with this.  Patient is not currently on anticoagulation.  Past Medical History:  Diagnosis Date  . Allergic rhinitis   . Anemia    takes Ferrous Sulfate daily  . Arthritis    "all over"  . Atrial flutter ([place])   . Balance problem 01/2014  . CAP (community acquired pneumonia) 09/18/2014  . Cervical radiculopathy due to degenerative joint disease of spine   . Chronic coronary artery disease    a. 03/2010 Nonocclusive disease by cath, performed for ST elevations on ECG;  b. 06/2013 Lexi MV: EF 60%, no ischemia.  [name] COPD (chronic obstructive pulmonary disease) ([place])   . Diabetes mellitus type II    takes Metformin and Lantus daily  . Diastolic CHF, chronic ([place])    a. 12/2012 EF 55-60%, diast dysfxn, triv MR, mildly dil LA/RA.  [name] Essential hypertension    takes Diltiazem daily  . Frail elderly   . GERD (gastroesophageal reflux disease)   . History of blood transfusion 1982   "when I  had stomach OR"  . History of bronchitis    1998  . History of CVA (cerebrovascular accident)   . History of gastric ulcer   . Hypercholesterolemia    takes Pravastatin daily  . Intrinsic eczema   . Legally blind in left eye, as defined in [place]   . Obesity   . PAF (paroxysmal atrial fibrillation) (HCC)    Recurrent after atrial flutter (a. 07/2010 Status post caval tricuspid isthmus ablation by Dr. Midge Aver Metoprolol daily), currently controlled on flecainide plus diltiazem and Coumadin  . Vitamin D deficiency   . Weakness    numbness and tingling both hands    Patient Active Problem List   Diagnosis Date Noted  . Rapid atrial fibrillation ([place]) 01/18/2018  . Atrial fibrillation with RVR ([place]) 11/08/2017  . Chest pain with moderate risk of acute coronary syndrome 10/04/2017  . HTN (hypertension) 05/21/2017  . Diabetes mellitus type II 05/21/2017  . Diastolic CHF, chronic ([place]) 05/21/2017  . COPD (chronic obstructive pulmonary disease) ([place]) 05/21/2017  . H/O atrial flutter 05/21/2017  . Coronary disease/nonobstructive 05/21/2017  . Acute blood loss anemia 10/02/2016  . Abdominal pain 10/02/2016  . Constipation 10/02/2016  . Left lower quadrant pain   . Coagulopathy ([place])   . Heme positive stool   . Symptomatic anemia 07/10/2016  . Syncope 05/15/2015  . Cervical disc disease 05/15/2015  .  Abnormal urinalysis 05/15/2015  . Anemia 05/15/2015  . History of CVA (cerebrovascular accident) 04/03/2014  . Near syncope 02/15/2014  . Pre-syncope 02/15/2014  . Benign neoplasm of rectum and anal canal 12/02/2013  . Upper GI bleeding 11/30/2013  . Tobacco use disorder 11/30/2013  . Anticoagulation goal of INR 2 to 3, for PAF - CHA2DS2Vasc = 7 08/15/2013  . Type 2 diabetes mellitus ([place]) 01/22/2013  . Tobacco abuse counseling 12/15/2012  . Hyperlipidemia   . Obesity (BMI 30-39.9) 12/14/2012  . Persistent atrial fibrillation ([place]) 10/07/2010  . Atrial flutter - status post CTI  ablation 07/29/2010  . Essential hypertension 07/29/2010  . Chronic diastolic congestive heart failure ([place]) 07/29/2010  . Coronary artery disease, non-occlusive 07/29/2010    Past Surgical History:  Procedure Laterality Date  . ATRIAL ABLATION SURGERY  08/05/10   CTI ablation for atrial flutter by [facility]  . CARDIAC CATHETERIZATION  2012   nl LV function, no occlusive CAD, PAF  . CARDIOVERSION  12/07/2010    Successful direct current cardioversion with atrial fibrillation to normal sinus rhythm  . CARPAL TUNNEL RELEASE Bilateral 01/30/2014   Procedure: BILATERAL CARPAL TUNNEL RELEASE;  Surgeon: Marianna Payment, MD;  Location: [facility];  Service: Orthopedics;  Laterality: Bilateral;  . CATARACT EXTRACTION W/ INTRAOCULAR LENS  IMPLANT, BILATERAL Bilateral   . COLONOSCOPY [name] 12/02/2013   Procedure: COLONOSCOPY;  Surgeon: Irene Shipper, MD;  Location: [name];  Service: Endoscopy;  Laterality: [name];  . ESOPHAGOGASTRODUODENOSCOPY [name] 09/22/2014   Procedure: ESOPHAGOGASTRODUODENOSCOPY (EGD);  Surgeon: Ronald Lobo, MD;  Location[facility] ENDOSCOPY;  Service: Endoscopy;  Laterality: [name];  . ESOPHAGOGASTRODUODENOSCOPY [name] 07/11/2016   Procedure: ESOPHAGOGASTRODUODENOSCOPY (EGD);  Surgeon: Doran Stabler, MD;  Location: [facility] ENDOSCOPY;  Service: Endoscopy;  Laterality: [name];  . INCISION AND DRAINAGE ABSCESS / HEMATOMA OF BURSA / KNEE / THIGH Left 1998   knee  . KNEE BURSECTOMY Left 1998  . LAPAROSCOPIC CHOLECYSTECTOMY  03/2010  . LEFT HEART CATH AND CORONARY ANGIOGRAPHY [name] 10/05/2017   Procedure: LEFT HEART CATH AND CORONARY ANGIOGRAPHY;  Surgeon: Martinique, Peter M, MD;  Location: [facility] CV LAB;  Service: Cardiovascular;  Laterality: [name];  . NM MYOVIEW LTD  07/22/2013   Normal EF ~60%, no ischemia or infarction.  [name] PARTIAL GASTRECTOMY  1982   subtotal; "took out 30% for ulcers"  . TRANSTHORACIC ECHOCARDIOGRAM  02/16/2014   EF 60%, no RWMA. - otherwise normal  . YAG LASER APPLICATION Bilateral          Home Medications    Prior to Admission medications   Medication Sig Start Date End Date Taking? Authorizing Provider  albuterol (PROVENTIL HFA;VENTOLIN HFA) 108 (90 Base) MCG/ACT inhaler Inhale 2 puffs into the lungs every 4 (four) hours as needed for wheezing or shortness of breath. 10/30/17   Palumbo, April, MD  aspirin EC 81 MG tablet Take 81 mg by mouth daily.    [provider]  ciprofloxacin (CIPRO) 500 MG tablet Take 1 tablet (500 mg total) by mouth 2 (two) times daily for 7 days. 04/10/18 04/17/18  Scot Jun, FNP  clotrimazole-betamethasone (LOTRISONE) cream Apply 1 application topically 2 (two) times daily. 04/04/18   Scot Jun, FNP  co-enzyme Q-10 30 MG capsule Take 30 mg by mouth daily.     [provider]  diclofenac sodium (VOLTAREN) 1 % GEL Apply 4 g topically 3 (three) times daily as needed (for back pain). 11/10/17   Rory Percy, DO  diltiazem (CARDIZEM CD)  300 MG 24 hr capsule Take 1 capsule (300 mg total) by mouth daily. 02/19/18   Camnitz, Will Hassell Done, MD  diltiazem (CARDIZEM) 30 MG tablet TAKE 1 TABLET BY MOUTH 4 TIMES DAILY AS NEEDED FOR AFIB 03/29/18   Camnitz, Ocie Doyne, MD  ferrous sulfate 325 (65 FE) MG tablet Take 1 tablet by mouth 4 (four) times a week. Takes M/T/W/F 02/14/18   [provider]  glucose blood (PRECISION QID TEST) test strip Check glucose twice daily 10/24/13   [provider]  hydrocortisone cream 1 % Apply to affected area 2 times daily 12/24/17   Maczis, Barth Kirks, PA-C  magnesium hydroxide (DULCOLAX MILK OF MAGNESIA) 400 MG/5ML suspension Take 5 mLs by mouth daily as needed for mild constipation.    [provider]  Melatonin 3 MG TABS Take 1 tablet (3 mg total) by mouth at bedtime. 11/16/17   Anderson, Chelsey L, DO  metFORMIN (GLUCOPHAGE) 1000 MG tablet Take 1,000 mg by mouth daily.    [provider]  Multiple Vitamin (MULTIVITAMIN WITH MINERALS) TABS tablet Take 1 tablet  by mouth 2 (two) times daily.     [provider]  nitroGLYCERIN (NITROSTAT) 0.4 MG SL tablet Place 1 tablet (0.4 mg total) under the tongue every 5 (five) minutes x 3 doses as needed for chest pain. 11/19/15   Erlene Quan, PA-C  nystatin cream (MYCOSTATIN) Apply to affected area 2 times daily 01/02/18   Deno Etienne, DO  pantoprazole (PROTONIX) 40 MG tablet Take 1 tablet (40 mg total) by mouth 2 (two) times daily before a meal. 11/30/16   Domenic Polite, MD  pravastatin (PRAVACHOL) 40 MG tablet Take 1 tablet (40 mg total) by mouth every evening. 05/04/17   Leonie Man, MD  tamsulosin (FLOMAX) 0.4 MG CAPS capsule Take 0.4 mg by mouth daily. 02/20/17   [provider]  vitamin C (ASCORBIC ACID) 250 MG tablet Take 1 tablet (250 mg total) by mouth daily. Patient taking differently: Take 250 mg by mouth 3 (three) times a week.  01/20/18   Rise Mu, PA-C    Family History Family History  Problem Relation Age of Onset  . Breast cancer Mother   . Diabetes Mother   . Kidney disease Mother   . Heart attack Father   . Heart disease Father   . Heart attack Brother   . Leukemia Daughter     Social History Social History   Tobacco Use  . Smoking status: Current Every Day Smoker    Packs/day: 0.50    Years: 57.00    Pack years: 28.50    Types: Cigarettes  . Smokeless tobacco: Never Used  . Tobacco comment: He's been smoking between 0.5 and 2 ppd since age 51.  Substance Use Topics  . Alcohol use: No    Alcohol/week: 0.0 standard drinks    Comment: "quit drinking in 1986"  . Drug use: No     Allergies   Cyclobenzaprine   Review of Systems Review of Systems  All other systems reviewed and are negative.    Physical Exam Updated Vital Signs BP 137/72 (BP Location: Left Arm)   Pulse 82   Temp 98.8 F (37.1 C) (Oral)   Resp 16   SpO2 96%   Physical Exam Vitals signs and nursing note reviewed.  Constitutional:      Appearance: He is well-developed.   HENT:     Head: Normocephalic and atraumatic.  Eyes:     General:  No scleral icterus.       Right eye: No discharge.        Left eye: No discharge.     Conjunctiva/sclera: Conjunctivae normal.     Pupils: Pupils are equal, round, and reactive to light.  Neck:     Musculoskeletal: Normal range of motion.     Vascular: No JVD.     Trachea: No tracheal deviation.  Cardiovascular:     Rate and Rhythm: Normal rate and regular rhythm.  Pulmonary:     Effort: Pulmonary effort is normal. No respiratory distress.     Breath sounds: No stridor. No wheezing or rhonchi.  Neurological:     Mental Status: He is alert and oriented to person, place, and time.     Coordination: Coordination normal.  Psychiatric:        Behavior: Behavior normal.        Thought Content: Thought content normal.        Judgment: Judgment normal.      ED Treatments / Results  Labs (all labs ordered are listed, but only abnormal results are displayed) Labs Reviewed  BASIC METABOLIC PANEL - Abnormal; Notable for the following components:      Result Value   Potassium 3.2 (*)    Glucose, Bld 224 (*)    All other components within normal limits  CBC - Abnormal; Notable for the following components:   RBC 4.13 (*)    Hemoglobin 11.6 (*)    HCT 37.6 (*)    All other components within normal limits  I-STAT TROPONIN, ED    EKG EKG Interpretation  Date/Time:  Friday April 12 2018 12:04:40 EST Ventricular Rate:  85 PR Interval:    QRS Duration: 90 QT Interval:  356 QTC Calculation: 423 R Axis:   26 Text Interpretation:  Atrial flutter with variable A-V block Anterior infarct , age undetermined Abnormal ECG No acute changes fluuter is new Confirmed by Varney Biles [id number]) on 04/12/2018 1:51:19 PM   Radiology Dg Chest 2 View  Result Date: 04/12/2018 CLINICAL DATA:  Short of breath and weak for a few days. History of COPD. EXAM: CHEST - 2 VIEW COMPARISON:  04/08/2018 FINDINGS: Cardiac silhouette is  normal in size. No mediastinal or hilar masses. No evidence of adenopathy. Clear lungs.  No pleural effusion or pneumothorax. Skeletal structures are intact. IMPRESSION: No active cardiopulmonary disease. Electronically Signed   By: Lajean Manes M.D.   On: 04/12/2018 12:36    Procedures Procedures (including critical care time)  Medications Ordered in ED Medications  sodium chloride flush (NS) 0.9 % injection 3 mL (3 mLs Intravenous Not Given 04/12/18 1408)     Initial Impression / Assessment and Plan / ED Course  I have reviewed the triage vital signs and the nursing notes.  Pertinent labs & imaging results that were available during my care of the patient were reviewed by me and considered in my medical decision making (see chart for details).     Assessment/Plan: Mr. Mcmahill is a 78 year old male with a history of proximal atrial fibrillation.  He had a brief episode while here in the ED, but has been monitored for the remainder of his stay and has had no subsequent runs of atrial fibrillation.  Most recently he was seen by cardiology, he has an outpatient visit scheduled.  He appears to be in no acute distress, no subsequent runs of atrial fibrillation here.  Patient is not currently on anticoagulation. Chads vas calculated at 6  based on history.  Patient will be discharged with continued outpatient management, strict return precautions.  He verbalized understanding and agreement to today's plan.  Final Clinical Impressions(s) / ED Diagnoses   Final diagnoses:  Paroxysmal atrial fibrillation [facility])    ED Discharge Orders    None       Okey Regal, PA-C 04/12/18 [place], MD 04/13/18 1137

## 2018-04-12 NOTE — Telephone Encounter (Signed)
° ° °  STAT if HR is under 50 or over 120 (normal HR is 60-100 beats per minute)  1) What is your heart rate? 157  2) Do you have a log of your heart rate readings (document readings)? 157-185  3) Do you have any other symptoms?  Chest pain

## 2018-04-12 NOTE — Telephone Encounter (Signed)
Pt called top report that he has been having chest pain and his HR on his BP monitor is 185... he feels SOB and dizziness... he says she is going to the ER after he changes his clothes... I advised him to call right away and he agreed.Marland KitchenMarland KitchenI asked if he was there with anyone so he is not waiting alone for EMS and he says his son and daughter are in the home.   Pt called back a few minutes later and spoke with another nurse, Suezanne Jacquet, reporting that he does not know how to call for EMS.. he knew it was 911 but did not know how to "dial it"... so we called 911 for him and he was very appreciative.[name] advised him to relax and let his family know that EMS is coming.

## 2018-04-12 NOTE — ED Triage Notes (Signed)
Pt arrives by [facility]- for left side left cheat pain in mid axilla area- pt reports for the 2 days he has been having palpations.

## 2018-04-12 NOTE — Discharge Instructions (Addendum)
Please read attached information. If you experience any new or worsening signs or symptoms please return to the emergency room for evaluation. Please follow-up with your primary care provider or specialist as discussed. Please continue using previously prescribed medication.

## 2018-04-13 ENCOUNTER — Other Ambulatory Visit (HOSPITAL_COMMUNITY): Payer: Self-pay | Admitting: Physician Assistant

## 2018-04-13 ENCOUNTER — Emergency Department (HOSPITAL_COMMUNITY)
Admission: EM | Admit: 2018-04-13 | Discharge: 2018-04-13 | Disposition: A | Discharge status: Home / Self Care | Payer: Medicare Other | Attending: Emergency Medicine | Admitting: Emergency Medicine

## 2018-04-13 ENCOUNTER — Encounter (HOSPITAL_COMMUNITY): Payer: Self-pay | Admitting: Emergency Medicine

## 2018-04-13 DIAGNOSIS — F17210 Nicotine dependence, cigarettes, uncomplicated: Secondary | ICD-10-CM | POA: Diagnosis not present

## 2018-04-13 DIAGNOSIS — J449 Chronic obstructive pulmonary disease, unspecified: Secondary | ICD-10-CM | POA: Insufficient documentation

## 2018-04-13 DIAGNOSIS — K649 Unspecified hemorrhoids: Secondary | ICD-10-CM | POA: Insufficient documentation

## 2018-04-13 DIAGNOSIS — I480 Paroxysmal atrial fibrillation: Secondary | ICD-10-CM | POA: Insufficient documentation

## 2018-04-13 DIAGNOSIS — I5032 Chronic diastolic (congestive) heart failure: Secondary | ICD-10-CM | POA: Insufficient documentation

## 2018-04-13 DIAGNOSIS — Z7982 Long term (current) use of aspirin: Secondary | ICD-10-CM | POA: Insufficient documentation

## 2018-04-13 DIAGNOSIS — E7800 Pure hypercholesterolemia, unspecified: Secondary | ICD-10-CM | POA: Diagnosis not present

## 2018-04-13 DIAGNOSIS — Z8673 Personal history of transient ischemic attack (TIA), and cerebral infarction without residual deficits: Secondary | ICD-10-CM | POA: Diagnosis not present

## 2018-04-13 DIAGNOSIS — K921 Melena: Secondary | ICD-10-CM | POA: Diagnosis not present

## 2018-04-13 DIAGNOSIS — R195 Other fecal abnormalities: Secondary | ICD-10-CM | POA: Insufficient documentation

## 2018-04-13 DIAGNOSIS — E876 Hypokalemia: Secondary | ICD-10-CM | POA: Diagnosis not present

## 2018-04-13 DIAGNOSIS — I2510 Atherosclerotic heart disease of native coronary artery without angina pectoris: Secondary | ICD-10-CM | POA: Insufficient documentation

## 2018-04-13 DIAGNOSIS — I110 Hypertensive heart disease with heart failure: Secondary | ICD-10-CM | POA: Diagnosis not present

## 2018-04-13 DIAGNOSIS — I4891 Unspecified atrial fibrillation: Secondary | ICD-10-CM | POA: Diagnosis not present

## 2018-04-13 DIAGNOSIS — Z79899 Other long term (current) drug therapy: Secondary | ICD-10-CM | POA: Insufficient documentation

## 2018-04-13 DIAGNOSIS — R5381 Other malaise: Secondary | ICD-10-CM | POA: Diagnosis not present

## 2018-04-13 DIAGNOSIS — K644 Residual hemorrhoidal skin tags: Secondary | ICD-10-CM | POA: Diagnosis not present

## 2018-04-13 DIAGNOSIS — E119 Type 2 diabetes mellitus without complications: Secondary | ICD-10-CM | POA: Insufficient documentation

## 2018-04-13 LAB — COMPREHENSIVE METABOLIC PANEL
ALT: 30 U/L (ref 0–44)
AST: 25 U/L (ref 15–41)
Albumin: 3.3 g/dL — ABNORMAL LOW (ref 3.5–5.0)
Alkaline Phosphatase: 56 U/L (ref 38–126)
Anion gap: 11 (ref 5–15)
BUN: 10 mg/dL (ref 8–23)
CHLORIDE: 107 mmol/L (ref 98–111)
CO2: 23 mmol/L (ref 22–32)
CREATININE: 0.9 mg/dL (ref 0.61–1.24)
Calcium: 9.2 mg/dL (ref 8.9–10.3)
GFR calc Af Amer: >60 mL/min (ref >60)
GFR calc non Af Amer: >60 mL/min (ref >60)
Glucose, Bld: 190 mg/dL — ABNORMAL HIGH (ref 70–99)
POTASSIUM: 3 mmol/L — AB (ref 3.5–5.1)
Sodium: 141 mmol/L (ref 135–145)
Total Bilirubin: 0.4 mg/dL (ref 0.3–1.2)
Total Protein: 6.1 g/dL — ABNORMAL LOW (ref 6.5–8.1)

## 2018-04-13 LAB — POC OCCULT BLOOD, ED: Fecal Occult Bld: POSITIVE — AB

## 2018-04-13 LAB — CBC
HEMATOCRIT: 34.3 % — AB (ref 39.0–52.0)
Hemoglobin: 10.8 g/dL — ABNORMAL LOW (ref 13.0–17.0)
MCH: 29 pg (ref 26.0–34.0)
MCHC: 31.5 g/dL (ref 30.0–36.0)
MCV: 92 fL (ref 80.0–100.0)
Platelets: 356 10*3/uL (ref 150–400)
RBC: 3.73 MIL/uL — ABNORMAL LOW (ref 4.22–5.81)
RDW: 14.4 % (ref 11.5–15.5)
WBC: 6.1 10*3/uL (ref 4.0–10.5)
nRBC: 0 % (ref 0.0–0.2)

## 2018-04-13 LAB — URINALYSIS, ROUTINE W REFLEX MICROSCOPIC
Bilirubin Urine: NEGATIVE
Glucose, UA: 50 mg/dL — AB
HGB URINE DIPSTICK: NEGATIVE
Ketones, ur: NEGATIVE mg/dL
Leukocytes, UA: NEGATIVE
Nitrite: NEGATIVE
Protein, ur: NEGATIVE mg/dL
Specific Gravity, Urine: 1.014 (ref 1.005–1.030)
pH: 6 (ref 5.0–8.0)

## 2018-04-13 LAB — TYPE AND SCREEN
ABO/RH(D): A NEG
Antibody Screen: NEGATIVE

## 2018-04-13 LAB — LIPASE, BLOOD: Lipase: 30 U/L (ref 11–51)

## 2018-04-13 MED ORDER — POTASSIUM CHLORIDE ER 10 MEQ PO TBCR: 20.0000 meq | EXTENDED_RELEASE_TABLET | Freq: Every day | ORAL | 0 refills | Status: DC

## 2018-04-13 MED ORDER — DILTIAZEM HCL 90 MG PO TABS
300.0000 mg | ORAL_TABLET | Freq: Once | ORAL | Status: AC
  Administered 2018-04-13: 300 mg via ORAL
  Filled 2018-04-13: qty 1

## 2018-04-13 MED ORDER — POTASSIUM CHLORIDE CRYS ER 20 MEQ PO TBCR
40.0000 meq | EXTENDED_RELEASE_TABLET | Freq: Once | ORAL | Status: AC
  Administered 2018-04-13: 40 meq via ORAL
  Filled 2018-04-13: qty 2

## 2018-04-13 NOTE — ED Provider Notes (Signed)
[facility] EMERGENCY DEPARTMENT Provider Note   CSN: [id number] Arrival date & time: 04/13/18  0204     History   Chief Complaint Chief Complaint  Patient presents with  . GI Bleeding    HPI [name] is a 78 y.o. male with history of A. fib, COPD, diabetes, hypertension presenting today with concern of blood in the stool.  Patient states that around midnight today he had a bowel movement and saw "a few spots of blood "in his stool.  Patient states that he had a second bowel movement around 2 AM and saw that the spots of blood were still there so he called EMS to bring him to the emergency department.  Patient does endorse generalized abdominal pain but states that this has been present for many months and there has been no change, he describes a diffuse aching pain that is constant without specific aggravating or alleviating factors.  Patient states that he has never noticed blood in his stool before.  Of note patient was seen in the emergency department yesterday, 04/12/2022 chest pain.  Patient without complaint of chest pain or shortness of breath today.  HPI  Past Medical History:  Diagnosis Date  . Allergic rhinitis   . Anemia    takes Ferrous Sulfate daily  . Arthritis    "all over"  . Atrial flutter ([address])   . Balance problem 01/2014  . CAP (community acquired pneumonia) 09/18/2014  . Cervical radiculopathy due to degenerative joint disease of spine   . Chronic coronary artery disease    a. 03/2010 Nonocclusive disease by cath, performed for ST elevations on ECG;  b. 06/2013 Lexi MV: EF 60%, no ischemia.  [name] COPD (chronic obstructive pulmonary disease) ([place])   . Diabetes mellitus type II    takes Metformin and Lantus daily  . Diastolic CHF, chronic ([place])    a. 12/2012 EF 55-60%, diast dysfxn, triv MR, mildly dil LA/RA.  [name] Essential hypertension    takes Diltiazem daily  . Frail elderly   . GERD (gastroesophageal reflux disease)   . History of  blood transfusion 1982   "when I had stomach OR"  . History of bronchitis    1998  . History of CVA (cerebrovascular accident)   . History of gastric ulcer   . Hypercholesterolemia    takes Pravastatin daily  . Intrinsic eczema   . Legally blind in left eye, as defined in [place]   . Obesity   . PAF (paroxysmal atrial fibrillation) (HCC)    Recurrent after atrial flutter (a. 07/2010 Status post caval tricuspid isthmus ablation by Dr. Midge Aver Metoprolol daily), currently controlled on flecainide plus diltiazem and Coumadin  . Vitamin D deficiency   . Weakness    numbness and tingling both hands    Patient Active Problem List   Diagnosis Date Noted  . Rapid atrial fibrillation ([place]) 01/18/2018  . Atrial fibrillation with RVR ([place]) 11/08/2017  . Chest pain with moderate risk of acute coronary syndrome 10/04/2017  . HTN (hypertension) 05/21/2017  . Diabetes mellitus type II 05/21/2017  . Diastolic CHF, chronic ([place]) 05/21/2017  . COPD (chronic obstructive pulmonary disease) ([place]) 05/21/2017  . H/O atrial flutter 05/21/2017  . Coronary disease/nonobstructive 05/21/2017  . Acute blood loss anemia 10/02/2016  . Abdominal pain 10/02/2016  . Constipation 10/02/2016  . Left lower quadrant pain   . Coagulopathy ([place])   . Heme positive stool   . Symptomatic anemia 07/10/2016  . Syncope 05/15/2015  .  Cervical disc disease 05/15/2015  . Abnormal urinalysis 05/15/2015  . Anemia 05/15/2015  . History of CVA (cerebrovascular accident) 04/03/2014  . Near syncope 02/15/2014  . Pre-syncope 02/15/2014  . Benign neoplasm of rectum and anal canal 12/02/2013  . Upper GI bleeding 11/30/2013  . Tobacco use disorder 11/30/2013  . Anticoagulation goal of INR 2 to 3, for PAF - CHA2DS2Vasc = 7 08/15/2013  . Type 2 diabetes mellitus ([place]) 01/22/2013  . Tobacco abuse counseling 12/15/2012  . Hyperlipidemia   . Obesity (BMI 30-39.9) 12/14/2012  . Persistent atrial fibrillation ([place]) 10/07/2010  .  Atrial flutter - status post CTI ablation 07/29/2010  . Essential hypertension 07/29/2010  . Chronic diastolic congestive heart failure ([place]) 07/29/2010  . Coronary artery disease, non-occlusive 07/29/2010    Past Surgical History:  Procedure Laterality Date  . ATRIAL ABLATION SURGERY  08/05/10   CTI ablation for atrial flutter by JA  . CARDIAC CATHETERIZATION  2012   nl LV function, no occlusive CAD, PAF  . CARDIOVERSION  12/07/2010    Successful direct current cardioversion with atrial fibrillation to normal sinus rhythm  . CARPAL TUNNEL RELEASE Bilateral 01/30/2014   Procedure: BILATERAL CARPAL TUNNEL RELEASE;  Surgeon: Marianna Payment, MD;  Location: [facility];  Service: Orthopedics;  Laterality: Bilateral;  . CATARACT EXTRACTION W/ INTRAOCULAR LENS  IMPLANT, BILATERAL Bilateral   . COLONOSCOPY [name] 12/02/2013   Procedure: COLONOSCOPY;  Surgeon: Irene Shipper, MD;  Location: [facility];  Service: Endoscopy;  Laterality: [name];  . ESOPHAGOGASTRODUODENOSCOPY [name] 09/22/2014   Procedure: ESOPHAGOGASTRODUODENOSCOPY (EGD);  Surgeon: Ronald Lobo, MD;  [facility] ENDOSCOPY;  Service: Endoscopy;  Laterality: [name];  . ESOPHAGOGASTRODUODENOSCOPY [name] 07/11/2016   Procedure: ESOPHAGOGASTRODUODENOSCOPY (EGD);  Surgeon: Doran Stabler, MD;  Location: [facility] ENDOSCOPY;  Service: Endoscopy;  Laterality: [name];  . INCISION AND DRAINAGE ABSCESS / HEMATOMA OF BURSA / KNEE / THIGH Left 1998   knee  . KNEE BURSECTOMY Left 1998  . LAPAROSCOPIC CHOLECYSTECTOMY  03/2010  . LEFT HEART CATH AND CORONARY ANGIOGRAPHY [name] 10/05/2017   Procedure: LEFT HEART CATH AND CORONARY ANGIOGRAPHY;  Surgeon: Martinique, Peter M, MD;  Location: [facility] CV LAB;  Service: Cardiovascular;  Laterality: [name];  . NM MYOVIEW LTD  07/22/2013   Normal EF ~60%, no ischemia or infarction.  [name] PARTIAL GASTRECTOMY  1982   subtotal; "took out 30% for ulcers"  . TRANSTHORACIC ECHOCARDIOGRAM  02/16/2014   EF 60%, no RWMA. - otherwise normal  . YAG  LASER APPLICATION Bilateral         Home Medications    Prior to Admission medications   Medication Sig Start Date End Date Taking? Authorizing Provider  aspirin EC 81 MG tablet Take 81 mg by mouth daily.   Yes [provider]  clotrimazole-betamethasone (LOTRISONE) cream Apply 1 application topically 2 (two) times daily. 04/04/18  Yes Scot Jun, FNP  co-enzyme Q-10 30 MG capsule Take 30 mg by mouth daily.    Yes [provider]  diclofenac sodium (VOLTAREN) 1 % GEL Apply 4 g topically 3 (three) times daily as needed (for back pain). 11/10/17  Yes Rory Percy, DO  diltiazem (CARDIZEM CD) 300 MG 24 hr capsule Take 1 capsule (300 mg total) by mouth daily. 02/19/18  Yes Camnitz, Will Hassell Done, MD  ferrous sulfate 325 (65 FE) MG tablet Take 1 tablet by mouth 4 (four) times a week. Takes M/T/W/F 02/14/18  Yes [provider]  hydrocortisone cream 1 % Apply to affected area  2 times daily 12/24/17  Yes Maczis, Barth Kirks, PA-C  magnesium hydroxide (DULCOLAX MILK OF MAGNESIA) 400 MG/5ML suspension Take 5 mLs by mouth daily as needed for mild constipation.   Yes [provider]  Multiple Vitamin (MULTIVITAMIN WITH MINERALS) TABS tablet Take 1 tablet by mouth 2 (two) times daily.    Yes [provider]  nitroGLYCERIN (NITROSTAT) 0.4 MG SL tablet Place 1 tablet (0.4 mg total) under the tongue every 5 (five) minutes x 3 doses as needed for chest pain. 11/19/15  Yes Erlene Quan, PA-C  nystatin cream (MYCOSTATIN) Apply to affected area 2 times daily 01/02/18  Yes Deno Etienne, DO  pantoprazole (PROTONIX) 40 MG tablet Take 1 tablet (40 mg total) by mouth 2 (two) times daily before a meal. 11/30/16  Yes Domenic Polite, MD  pravastatin (PRAVACHOL) 40 MG tablet Take 1 tablet (40 mg total) by mouth every evening. 05/04/17  Yes Leonie Man, MD  vitamin C (ASCORBIC ACID) 250 MG tablet Take 1 tablet (250 mg total) by mouth daily. Patient taking differently: Take  250 mg by mouth 3 (three) times a week.  01/20/18  Yes Dunn, Areta Haber, PA-C  albuterol (PROVENTIL HFA;VENTOLIN HFA) 108 (90 Base) MCG/ACT inhaler Inhale 2 puffs into the lungs every 4 (four) hours as needed for wheezing or shortness of breath. 10/30/17   Palumbo, April, MD  ciprofloxacin (CIPRO) 500 MG tablet Take 1 tablet (500 mg total) by mouth 2 (two) times daily for 7 days. 04/10/18 04/17/18  Scot Jun, FNP  diltiazem (CARDIZEM) 30 MG tablet TAKE 1 TABLET BY MOUTH 4 TIMES DAILY AS NEEDED FOR AFIB Patient not taking: Reported on 04/13/2018 03/29/18   Camnitz, Ocie Doyne, MD  glucose blood (PRECISION QID TEST) test strip Check glucose twice daily 10/24/13   [provider]  Melatonin 3 MG TABS Take 1 tablet (3 mg total) by mouth at bedtime. Patient not taking: Reported on 04/13/2018 11/16/17   Doristine Mango L, DO  potassium chloride (K-DUR) 10 MEQ tablet Take 2 tablets (20 mEq total) by mouth daily for 2 days. 04/13/18 04/15/18  Deliah [place], PA-C    Family History Family History  Problem Relation Age of Onset  . Breast cancer Mother   . Diabetes Mother   . Kidney disease Mother   . Heart attack Father   . Heart disease Father   . Heart attack Brother   . Leukemia Daughter     Social History Social History   Tobacco Use  . Smoking status: Current Every Day Smoker    Packs/day: 0.50    Years: 57.00    Pack years: 28.50    Types: Cigarettes  . Smokeless tobacco: Never Used  . Tobacco comment: He's been smoking between 0.5 and 2 ppd since age 63.  Substance Use Topics  . Alcohol use: No    Alcohol/week: 0.0 standard drinks    Comment: "quit drinking in 1986"  . Drug use: No     Allergies   Cyclobenzaprine   Review of Systems Review of Systems  Constitutional: Negative.  Negative for chills and fever.  Eyes: Negative.  Negative for visual disturbance.  Respiratory: Negative.  Negative for cough and shortness of breath.   Cardiovascular: Negative.   Negative for chest pain.  Gastrointestinal: Positive for abdominal pain (x multiple months) and blood in stool. Negative for diarrhea, nausea and vomiting.  Genitourinary: Negative.  Negative for dysuria and hematuria.  Neurological: Negative.  Negative for dizziness, syncope,  weakness, numbness and headaches.   Physical Exam Updated Vital Signs BP 126/66   Pulse 65   Temp 97.6 F (36.4 C) (Oral)   Resp 16   SpO2 98%   Physical Exam Constitutional:      General: He is not in acute distress.    Appearance: Normal appearance. He is well-developed. He is not ill-appearing or diaphoretic.  HENT:     Head: Normocephalic and atraumatic.     Right Ear: External ear normal.     Left Ear: External ear normal.     Nose: Nose normal.     Mouth/Throat:     Lips: Pink.     Mouth: Mucous membranes are moist.     Pharynx: Oropharynx is clear.  Eyes:     General: Vision grossly intact. Gaze aligned appropriately.     Extraocular Movements: Extraocular movements intact.     Conjunctiva/sclera: Conjunctivae normal.     Pupils: Pupils are equal, round, and reactive to light.  Neck:     Musculoskeletal: Normal range of motion and neck supple.     Trachea: Trachea and phonation normal. No tracheal deviation.  Cardiovascular:     Rate and Rhythm: Normal rate. Rhythm irregularly irregular.     Pulses:          Dorsalis pedis pulses are 2+ on the right side and 2+ on the left side.       Posterior tibial pulses are 2+ on the right side and 2+ on the left side.     Heart sounds: Normal heart sounds.  Pulmonary:     Effort: Pulmonary effort is normal. No respiratory distress.     Breath sounds: Normal breath sounds and air entry.  Chest:     Chest wall: No tenderness.  Abdominal:     General: Bowel sounds are normal. There is no distension.     Palpations: Abdomen is soft.     Tenderness: There is generalized abdominal tenderness. There is no right CVA tenderness, left CVA tenderness, guarding  or rebound.  Genitourinary:    Comments: Rectal examination chaperoned by Lenice Pressman.  Patient with external hemorrhoid present.  Normal rectal tone.  No gross blood on examination.  No palpated internal hemorrhoids or fissures. Musculoskeletal: Normal range of motion.     Right lower leg: Normal.     Left lower leg: Normal.     Comments: Patient moving all extremity spontaneously and without distress.  Patient is ambulatory to the bathroom and back to room without difficulty or assistance.  Feet:     Right foot:     Protective Sensation: 3 sites tested. 3 sites sensed.     Left foot:     Protective Sensation: 3 sites tested. 3 sites sensed.  Skin:    General: Skin is warm and dry.     Capillary Refill: Capillary refill takes less than 2 seconds.  Neurological:     Mental Status: He is alert and oriented to person, place, and time.     GCS: GCS eye subscore is 4. GCS verbal subscore is 5. GCS motor subscore is 6.     Comments: Speech is clear and goal oriented, follows commands Major Cranial nerves without deficit, no facial droop Normal strength in upper and lower extremities bilaterally including dorsiflexion and plantar flexion, strong and equal grip strength Sensation normal to light touch Moves extremities without ataxia, coordination intact Normal gait  Psychiatric:        Mood and Affect: Mood  normal.        Behavior: Behavior normal.    ED Treatments / Results  Labs (all labs ordered are listed, but only abnormal results are displayed) Labs Reviewed  COMPREHENSIVE METABOLIC PANEL - Abnormal; Notable for the following components:      Result Value   Potassium 3.0 (*)    Glucose, Bld 190 (*)    Total Protein 6.1 (*)    Albumin 3.3 (*)    All other components within normal limits  CBC - Abnormal; Notable for the following components:   RBC 3.73 (*)    Hemoglobin 10.8 (*)    HCT 34.3 (*)    All other components within normal limits  URINALYSIS, ROUTINE W REFLEX  MICROSCOPIC - Abnormal; Notable for the following components:   Glucose, UA 50 (*)    All other components within normal limits  POC OCCULT BLOOD, ED - Abnormal; Notable for the following components:   Fecal Occult Bld POSITIVE (*)    All other components within normal limits  LIPASE, BLOOD  TYPE AND SCREEN    EKG EKG Interpretation  Date/Time:  Saturday April 13 2018 07:42:54 EST Ventricular Rate:  117 PR Interval:    QRS Duration: 88 QT Interval:  357 QTC Calculation: 432 R Axis:   -3 Text Interpretation:  Atrial fibrillation Anteroseptal infarct, age indeterminate ST elevation, consider inferior injury No significant change since last tracing Confirmed by Lacretia Leigh ([id number]) on 04/13/2018 7:45:07 AM  Radiology Dg Chest 2 View  Result Date: 04/12/2018 CLINICAL DATA:  Short of breath and weak for a few days. History of COPD. EXAM: CHEST - 2 VIEW COMPARISON:  04/08/2018 FINDINGS: Cardiac silhouette is normal in size. No mediastinal or hilar masses. No evidence of adenopathy. Clear lungs.  No pleural effusion or pneumothorax. Skeletal structures are intact. IMPRESSION: No active cardiopulmonary disease. Electronically Signed   By: Lajean Manes M.D.   On: 04/12/2018 12:36    Procedures Procedures (including critical care time)  Medications Ordered in ED Medications  potassium chloride SA (K-DUR,KLOR-CON) CR tablet 40 mEq (40 mEq Oral Given 04/13/18 0816)  diltiazem (CARDIZEM) tablet 300 mg (300 mg Oral Given 04/13/18 0816)     Initial Impression / Assessment and Plan / ED Course  I have reviewed the triage vital signs and the nursing notes.  Pertinent labs & imaging results that were available during my care of the patient were reviewed by me and considered in my medical decision making (see chart for details).    78 year old male presenting with few drops of bright red blood in the stool that occurred at midnight last night after straining to have a bowel movement.   Patient without other complaints on my evaluation.  Patient noted to be tachycardic with atrial fibrillation.  This is known to the patient and he has not taken his morning dose of diltiazem.  He has denied chest pain or shortness of breath today and is without other complaints aside from his bright red blood in the stool.  7:40 AM: Discussed case with Dr. Zenia Resides, will begin potassium supplementation and refer outpatient to GI.   ------------- Dr. Zenia Resides has seen and evaluated the patient.  Plan at this time is to give patient potassium supplementation and his morning dose of diltiazem. Plan discharge after mild tachycardia resolves. --------------- Hemoccult stool positive Lipase within normal limits Urinalysis with glucose present CMP with potassium of 3.0 CBC nonacute, values appear baseline Type and screen A- EKG shows A. fib without  acute changes reviewed by Dr. Zenia Resides ---------------- 8:55 AM: Patient reevaluation, he has just returned from the bathroom, ambulatory without assistance or distress.  Patient reports that he saw no blood in his stool on this most recent bowel movement.  He states that he is feeling well and is happy with his care today.  He is requesting breakfast and then wishes to be discharged.  Discussed outpatient GI follow-up with patient he is agreeable to this plan of care.  Patient reports that his heart rate has been elevated in the mornings for several weeks now and after taking morning dose of 300 mg diltiazem it often takes several hours for his heart rate to come down.  Patient states he has a follow-up with his cardiologist regarding this.  Afebrile reduction and tachycardia is not new today per patient.  We will continue to observe patient for now until tachycardia improves to around 100 bpm.  Discussed with Dr. Zenia Resides who agrees with plan of care. -------------------- 10:15 AM: Patient resting comfortably and in no acute distress.  Patient's tachycardia has resolved,  most recent heart rate is 65 bpm.  Patient denies chest pain or shortness of breath.  He states that he is feeling well and wishes to be discharged at this time.  Case rediscussed with Dr. Zenia Resides who agrees with discharge at this time. ------------------- Suspect patient's small amount of small red blood in stool today which has resolved likely related to straining, hemorrhoid or possible fissure.  Do not suspect large gastrointestinal bleeding at this time.  His blood work is overall reassuring.  On repeat examination patient does not have a surgical abdomen and there are no peritoneal signs. His vital signs are stable here at the emergency department and he is without complaint.  Incidentally patient noted to be mildly hypokalemic we have begun oral placement here in emergency department.  Patient to be discharged with 20 mg p.o. supplemental potassium for the next 2 days, discussed with Dr. Zenia Resides who agrees with plan.  Encouraged patient to have potassium level rechecked by PCP this week.  At this time there does not appear to be any evidence of an acute emergency medical condition and the patient appears stable for discharge with appropriate outpatient follow up. Diagnosis was discussed with patient who verbalizes understanding of care plan and is agreeable to discharge. I have discussed return precautions with patient who verbalizes understanding of return precautions. Patient strongly encouraged to follow-up with their PCP, GI and cardiology this week. All questions answered.  Note: Portions of this report may have been transcribed using voice recognition software. Every effort was made to ensure accuracy; however, inadvertent computerized transcription errors may still be present. Final Clinical Impressions(s) / ED Diagnoses   Final diagnoses:  Blood in stool  Hemorrhoids, unspecified hemorrhoid type  Hypokalemia    ED Discharge Orders         Ordered    potassium chloride (K-DUR) 10 MEQ  tablet  Daily     04/13/18 1010           Gari Crown 04/13/18 1026    Lacretia Leigh, MD 04/14/18 [phone]

## 2018-04-13 NOTE — Discharge Instructions (Addendum)
You have been diagnosed today with Blood in the Stool, hemorrhoid, low potassium level.  At this time there does not appear to be the presence of an emergent medical condition, however there is always the potential for conditions to change. Please read and follow the below instructions.  Please return to the Emergency Department immediately for any new or worsening symptoms. Please be sure to follow up with your Primary Care Provider this week regarding your visit today; please call their office to schedule an appointment even if you are feeling better for a follow-up visit. Please follow-up with the specialists at [facility] gastroenterology for further evaluation of the small amount of blood in your stool today.  Call their office on Monday to schedule this appointment. Additionally your potassium level was low today.  Please use the potassium supplement as prescribed over the next 2 days.  Please follow-up with your primary care provider this week to have your potassium level rechecked. Please continue to take all of your home medications as prescribed by your primary care provider.  Get help right away if: Your bleeding gets worse. You feel dizzy or you pass out (faint). You feel weak. You have fever You have very bad cramps in your back or belly (abdomen). You pass large clumps of blood (clots) in your poop. Your symptoms are getting worse. Get help right away if: You have chest pain. You have shortness of breath. You have vomiting or diarrhea that lasts for more than 2 days. You faint.  Please read the additional information packets attached to your discharge summary.  Do not take your medicine if  develop an itchy rash, swelling in your mouth or lips, or difficulty breathing.

## 2018-04-13 NOTE — ED Notes (Signed)
Pt discharged from ED; instructions provided and scripts given; Pt encouraged to return to ED if symptoms worsen and to f/u with PCP; Pt verbalized understanding of all instructions 

## 2018-04-13 NOTE — ED Triage Notes (Signed)
Per EMS, blood in stool X2 in two hours.  Amblatory on scene.  Vital signs WNL

## 2018-04-13 NOTE — ED Provider Notes (Signed)
Medical screening examination/treatment/procedure(s) were conducted as a shared visit with non-physician practitioner(s) and myself.  I personally evaluated the patient during the encounter.  EKG Interpretation  Date/Time:  Saturday April 13 2018 07:42:54 EST Ventricular Rate:  117 PR Interval:    QRS Duration: 88 QT Interval:  357 QTC Calculation: 432 R Axis:   -3 Text Interpretation:  Atrial fibrillation Anteroseptal infarct, age indeterminate ST elevation, consider inferior injury No significant change since last tracing Confirmed by Lacretia Leigh [phone]) on 04/13/2018 7:72:24 AM  78 year old male presents with bright red blood per rectum after straining to move his stools.  Patient had drops of blood in his toilet.  Denies any abdominal pain.  No emesis.  Does have a history of hemorrhoids which she has been self treating with Preparation H.  Patient hemoglobin stable here.  He is in A. fib which is chronic for him.  Heart rate slightly increased here and he has not had his a.m. dose of diltiazem.  Will give that to him here and monitor   Lacretia Leigh, MD 04/13/18 [phone]

## 2018-04-14 ENCOUNTER — Encounter (HOSPITAL_COMMUNITY): Payer: Self-pay | Admitting: Emergency Medicine

## 2018-04-14 ENCOUNTER — Emergency Department (HOSPITAL_COMMUNITY): Payer: Medicare Other

## 2018-04-14 ENCOUNTER — Other Ambulatory Visit: Payer: Self-pay

## 2018-04-14 ENCOUNTER — Emergency Department (HOSPITAL_COMMUNITY)
Admission: EM | Admit: 2018-04-14 | Discharge: 2018-04-15 | Disposition: A | Discharge status: Other Acute Inpatient Hospital | Payer: Medicare Other | Attending: Emergency Medicine | Admitting: Emergency Medicine

## 2018-04-14 DIAGNOSIS — Z7982 Long term (current) use of aspirin: Secondary | ICD-10-CM | POA: Diagnosis not present

## 2018-04-14 DIAGNOSIS — E876 Hypokalemia: Secondary | ICD-10-CM

## 2018-04-14 DIAGNOSIS — F32A Depression, unspecified: Secondary | ICD-10-CM | POA: Diagnosis present

## 2018-04-14 DIAGNOSIS — F332 Major depressive disorder, recurrent severe without psychotic features: Secondary | ICD-10-CM | POA: Diagnosis present

## 2018-04-14 DIAGNOSIS — J449 Chronic obstructive pulmonary disease, unspecified: Secondary | ICD-10-CM | POA: Insufficient documentation

## 2018-04-14 DIAGNOSIS — R45851 Suicidal ideations: Secondary | ICD-10-CM

## 2018-04-14 DIAGNOSIS — E1165 Type 2 diabetes mellitus with hyperglycemia: Secondary | ICD-10-CM | POA: Diagnosis not present

## 2018-04-14 DIAGNOSIS — R51 Headache: Secondary | ICD-10-CM | POA: Diagnosis not present

## 2018-04-14 DIAGNOSIS — F329 Major depressive disorder, single episode, unspecified: Secondary | ICD-10-CM | POA: Diagnosis not present

## 2018-04-14 DIAGNOSIS — R0989 Other specified symptoms and signs involving the circulatory and respiratory systems: Secondary | ICD-10-CM | POA: Diagnosis not present

## 2018-04-14 DIAGNOSIS — R339 Retention of urine, unspecified: Secondary | ICD-10-CM | POA: Diagnosis not present

## 2018-04-14 DIAGNOSIS — Z79899 Other long term (current) drug therapy: Secondary | ICD-10-CM | POA: Insufficient documentation

## 2018-04-14 DIAGNOSIS — F17210 Nicotine dependence, cigarettes, uncomplicated: Secondary | ICD-10-CM | POA: Insufficient documentation

## 2018-04-14 DIAGNOSIS — I5032 Chronic diastolic (congestive) heart failure: Secondary | ICD-10-CM | POA: Insufficient documentation

## 2018-04-14 DIAGNOSIS — R519 Headache, unspecified: Secondary | ICD-10-CM

## 2018-04-14 DIAGNOSIS — Z008 Encounter for other general examination: Secondary | ICD-10-CM | POA: Diagnosis present

## 2018-04-14 DIAGNOSIS — I4892 Unspecified atrial flutter: Secondary | ICD-10-CM | POA: Diagnosis not present

## 2018-04-14 DIAGNOSIS — I110 Hypertensive heart disease with heart failure: Secondary | ICD-10-CM | POA: Diagnosis not present

## 2018-04-14 DIAGNOSIS — S79912A Unspecified injury of left hip, initial encounter: Secondary | ICD-10-CM | POA: Diagnosis not present

## 2018-04-14 DIAGNOSIS — M25552 Pain in left hip: Secondary | ICD-10-CM | POA: Diagnosis not present

## 2018-04-14 LAB — URINALYSIS, ROUTINE W REFLEX MICROSCOPIC
Bilirubin Urine: NEGATIVE
Glucose, UA: NEGATIVE mg/dL
Hgb urine dipstick: NEGATIVE
Ketones, ur: 5 mg/dL — AB
Leukocytes, UA: NEGATIVE
Nitrite: NEGATIVE
Protein, ur: NEGATIVE mg/dL
Specific Gravity, Urine: 1.014 (ref 1.005–1.030)
pH: 6 (ref 5.0–8.0)

## 2018-04-14 LAB — CBC WITH DIFFERENTIAL/PLATELET
Abs Immature Granulocytes: 0.02 10*3/uL (ref 0.00–0.07)
Basophils Absolute: 0.1 10*3/uL (ref 0.0–0.1)
Basophils Relative: 1 %
Eosinophils Absolute: 0.1 10*3/uL (ref 0.0–0.5)
Eosinophils Relative: 1 %
HCT: 33.6 % — ABNORMAL LOW (ref 39.0–52.0)
Hemoglobin: 10.5 g/dL — ABNORMAL LOW (ref 13.0–17.0)
IMMATURE GRANULOCYTES: 0 %
Lymphocytes Relative: 11 %
Lymphs Abs: 0.8 10*3/uL (ref 0.7–4.0)
MCH: 28.8 pg (ref 26.0–34.0)
MCHC: 31.3 g/dL (ref 30.0–36.0)
MCV: 92.1 fL (ref 80.0–100.0)
Monocytes Absolute: 0.7 10*3/uL (ref 0.1–1.0)
Monocytes Relative: 9 %
NEUTROS ABS: 5.6 10*3/uL (ref 1.7–7.7)
Neutrophils Relative %: 78 %
PLATELETS: 340 10*3/uL (ref 150–400)
RBC: 3.65 MIL/uL — ABNORMAL LOW (ref 4.22–5.81)
RDW: 14.4 % (ref 11.5–15.5)
WBC: 7.2 10*3/uL (ref 4.0–10.5)
nRBC: 0 % (ref 0.0–0.2)

## 2018-04-14 LAB — RAPID URINE DRUG SCREEN, HOSP PERFORMED
Amphetamines: NOT DETECTED
BARBITURATES: NOT DETECTED
Benzodiazepines: NOT DETECTED
Cocaine: NOT DETECTED
Opiates: NOT DETECTED
Tetrahydrocannabinol: NOT DETECTED

## 2018-04-14 LAB — COMPREHENSIVE METABOLIC PANEL
ALT: 29 U/L (ref 0–44)
AST: 26 U/L (ref 15–41)
Albumin: 3.7 g/dL (ref 3.5–5.0)
Alkaline Phosphatase: 62 U/L (ref 38–126)
Anion gap: 11 (ref 5–15)
BUN: 15 mg/dL (ref 8–23)
CO2: 22 mmol/L (ref 22–32)
Calcium: 8.9 mg/dL (ref 8.9–10.3)
Chloride: 103 mmol/L (ref 98–111)
Creatinine, Ser: 1.08 mg/dL (ref 0.61–1.24)
GFR calc Af Amer: >60 mL/min (ref >60)
GFR calc non Af Amer: >60 mL/min (ref >60)
Glucose, Bld: 203 mg/dL — ABNORMAL HIGH (ref 70–99)
Potassium: 3 mmol/L — ABNORMAL LOW (ref 3.5–5.1)
Sodium: 136 mmol/L (ref 135–145)
Total Bilirubin: 0.4 mg/dL (ref 0.3–1.2)
Total Protein: 6.7 g/dL (ref 6.5–8.1)

## 2018-04-14 LAB — ETHANOL

## 2018-04-14 LAB — SALICYLATE LEVEL

## 2018-04-14 LAB — ACETAMINOPHEN LEVEL: Acetaminophen (Tylenol), Serum: <10 ug/mL — ABNORMAL LOW (ref 10–30)

## 2018-04-14 MED ORDER — ALUM & MAG HYDROXIDE-SIMETH 200-200-20 MG/5ML PO SUSP: 30.0000 mL | Freq: Four times a day (QID) | ORAL | Status: DC | PRN

## 2018-04-14 MED ORDER — FERROUS SULFATE 325 (65 FE) MG PO TABS
325.0000 mg | ORAL_TABLET | Freq: Every day | ORAL | Status: DC
  Administered 2018-04-14 – 2018-04-15 (×2): 325 mg via ORAL
  Filled 2018-04-14 (×2): qty 1

## 2018-04-14 MED ORDER — PRAVASTATIN SODIUM 20 MG PO TABS
40.0000 mg | ORAL_TABLET | Freq: Every evening | ORAL | Status: DC
  Administered 2018-04-14: 40 mg via ORAL
  Filled 2018-04-14: qty 2

## 2018-04-14 MED ORDER — MAGNESIUM HYDROXIDE 400 MG/5ML PO SUSP
5.0000 mL | Freq: Every day | ORAL | Status: DC | PRN
  Filled 2018-04-14: qty 30

## 2018-04-14 MED ORDER — ALBUTEROL SULFATE HFA 108 (90 BASE) MCG/ACT IN AERS: 2.0000 | INHALATION_SPRAY | RESPIRATORY_TRACT | Status: DC | PRN

## 2018-04-14 MED ORDER — ADULT MULTIVITAMIN W/MINERALS CH
1.0000 | ORAL_TABLET | Freq: Two times a day (BID) | ORAL | Status: DC
  Administered 2018-04-14 – 2018-04-15 (×2): 1 via ORAL
  Filled 2018-04-14 (×2): qty 1

## 2018-04-14 MED ORDER — DILTIAZEM HCL ER COATED BEADS 300 MG PO CP24
300.0000 mg | ORAL_CAPSULE | Freq: Every day | ORAL | Status: DC
  Administered 2018-04-15: 300 mg via ORAL
  Filled 2018-04-14: qty 1

## 2018-04-14 MED ORDER — ACETAMINOPHEN 325 MG PO TABS: 650.0000 mg | ORAL_TABLET | ORAL | Status: DC | PRN

## 2018-04-14 MED ORDER — ONDANSETRON HCL 4 MG PO TABS: 4.0000 mg | ORAL_TABLET | Freq: Three times a day (TID) | ORAL | Status: DC | PRN

## 2018-04-14 MED ORDER — COENZYME Q10 30 MG PO CAPS: 30.0000 mg | ORAL_CAPSULE | Freq: Every day | ORAL | Status: DC

## 2018-04-14 MED ORDER — DICLOFENAC SODIUM 1 % TD GEL
4.0000 g | Freq: Two times a day (BID) | TRANSDERMAL | Status: DC | PRN
  Administered 2018-04-14 – 2018-04-15 (×2): 4 g via TOPICAL
  Filled 2018-04-14: qty 100

## 2018-04-14 MED ORDER — ASPIRIN EC 81 MG PO TBEC
81.0000 mg | DELAYED_RELEASE_TABLET | Freq: Every day | ORAL | Status: DC
  Administered 2018-04-14 – 2018-04-15 (×2): 81 mg via ORAL
  Filled 2018-04-14 (×2): qty 1

## 2018-04-14 MED ORDER — NITROGLYCERIN 0.4 MG SL SUBL: 0.4000 mg | SUBLINGUAL_TABLET | SUBLINGUAL | Status: DC | PRN

## 2018-04-14 MED ORDER — PANTOPRAZOLE SODIUM 40 MG PO TBEC
40.0000 mg | DELAYED_RELEASE_TABLET | Freq: Two times a day (BID) | ORAL | Status: DC
  Administered 2018-04-15: 40 mg via ORAL
  Filled 2018-04-14: qty 1

## 2018-04-14 MED ORDER — POTASSIUM CHLORIDE CRYS ER 20 MEQ PO TBCR
60.0000 meq | EXTENDED_RELEASE_TABLET | Freq: Once | ORAL | Status: AC
  Administered 2018-04-14: 60 meq via ORAL
  Filled 2018-04-14: qty 3

## 2018-04-14 MED ORDER — TAMSULOSIN HCL 0.4 MG PO CAPS
0.4000 mg | ORAL_CAPSULE | Freq: Every day | ORAL | Status: DC
  Administered 2018-04-14 – 2018-04-15 (×2): 0.4 mg via ORAL
  Filled 2018-04-14 (×2): qty 1

## 2018-04-14 MED ORDER — CLOTRIMAZOLE-BETAMETHASONE 1-0.05 % EX CREA
1.0000 "application " | TOPICAL_CREAM | Freq: Two times a day (BID) | CUTANEOUS | Status: DC
  Administered 2018-04-15: 1 via TOPICAL
  Filled 2018-04-14: qty 15

## 2018-04-14 NOTE — ED Triage Notes (Signed)
Pt states he needs evaluation for "many mental problems" and has threatened to take an overdose of pills to "put him out of his misery". Pt states he was seen at [facility] yesterday but did not tell them about it because he had other things going on. Pt states this has been present x several months, he has insomnia and he is having memory problems.

## 2018-04-14 NOTE — Discharge Instructions (Addendum)
See the urologist for ongoing care of your issues with urination.

## 2018-04-14 NOTE — ED Notes (Signed)
Bed: [facility] Expected date:  Expected time:  Means of arrival:  Comments: Florene Glen

## 2018-04-14 NOTE — ED Notes (Signed)
Bladder scan 450 ml

## 2018-04-14 NOTE — ED Notes (Signed)
Pts daughter has his belongings. Pts daughter is in the lobby at this time

## 2018-04-14 NOTE — BH Assessment (Addendum)
Assessment Note  [name] is an 78 y.o. male, who presents voluntary and unaccompanied to [facility]. Pt reported, "not wanting to live for a long time." Pt reported, three years ago is when he health began to decline. Pt reported, within the past five months he has been back and forth to [facility] [place] and his primary care provider Molli Barrows, FNP) for issues related to his heart, penile rash, kidney problems, frequent urination, yeast, bacterial infection. Pt reported, he was at [facility] three times last week. Pt reported, he did not get any help and was discharged after a few hours. Pt reported, he was prescribe antibiotic from his primary care provider however the first round interfered with his medication and the third round raised his blood pressure. Pt reported, his daughter and son-in-laws work hours change and he is at home at night alone. Pt reported, he has no energy or will to eat/cook causing him to lose 22 pounds in three months. Pt reported, has has some confusion. Pt reported, when he is alone he has suicidal thoughts. Pt reported, initially he wanted to walk on the overpass in front of a car but since he is not able to get around like he could he would overdose on his medications. Pt reported, he told his daughter several weeks ago don be surprised if you find me in the bed of in the floor. Pt reported, he told his daughter again today, and she took him to the hospital. Pt denies, HI, AVH, self-injurious behaviors and access to weapons.   Pt denies abuse and substance use. Pt's UDS is negative. Pt denies, being linked to OPT resources (medication management and/or counseling.) Pt denies, previous inpatient admissions.   Pt presents alert in scrubs with logical, coherent speech. Pt's eye contact was good. Pt's mood was depressed, helpless, despair. Pt's affect was congruent with mood. Pt's thought process was coherent, relevant. Pt's judgement was partial. Pt was oriented x4. Pt's concentration  was normal. Pt's insight was good. Pt's impulse control was fair. Pt reported, he can not be alone, and wants help. Pt reported, if inpatient treatment is recommended he would sign-in voluntarily.   Diagnosis: Major Depressive Disorder, recurrent, severe without psychosis.   Past Medical History:  Past Medical History:  Diagnosis Date  . Allergic rhinitis   . Anemia    takes Ferrous Sulfate daily  . Arthritis    "all over"  . Atrial flutter ([place])   . Balance problem 01/2014  . CAP (community acquired pneumonia) 09/18/2014  . Cervical radiculopathy due to degenerative joint disease of spine   . Chronic coronary artery disease    a. 03/2010 Nonocclusive disease by cath, performed for ST elevations on ECG;  b. 06/2013 Lexi MV: EF 60%, no ischemia.  [name] COPD (chronic obstructive pulmonary disease) ([place])   . Diabetes mellitus type II    takes Metformin and Lantus daily  . Diastolic CHF, chronic ([place])    a. 12/2012 EF 55-60%, diast dysfxn, triv MR, mildly dil LA/RA.  [name] Essential hypertension    takes Diltiazem daily  . Frail elderly   . GERD (gastroesophageal reflux disease)   . History of blood transfusion 1982   "when I had stomach OR"  . History of bronchitis    1998  . History of CVA (cerebrovascular accident)   . History of gastric ulcer   . Hypercholesterolemia    takes Pravastatin daily  . Intrinsic eczema   . Legally blind in left eye, as defined in  [place]   . Obesity   . PAF (paroxysmal atrial fibrillation) (HCC)    Recurrent after atrial flutter (a. 07/2010 Status post caval tricuspid isthmus ablation by Dr. Midge Aver Metoprolol daily), currently controlled on flecainide plus diltiazem and Coumadin  . Vitamin D deficiency   . Weakness    numbness and tingling both hands    Past Surgical History:  Procedure Laterality Date  . ATRIAL ABLATION SURGERY  08/05/10   CTI ablation for atrial flutter by JA  . CARDIAC CATHETERIZATION  2012   nl LV function, no occlusive CAD, PAF   . CARDIOVERSION  12/07/2010    Successful direct current cardioversion with atrial fibrillation to normal sinus rhythm  . CARPAL TUNNEL RELEASE Bilateral 01/30/2014   Procedure: BILATERAL CARPAL TUNNEL RELEASE;  Surgeon: Marianna Payment, MD;  Location: [place];  Service: Orthopedics;  Laterality: Bilateral;  . CATARACT EXTRACTION W/ INTRAOCULAR LENS  IMPLANT, BILATERAL Bilateral   . COLONOSCOPY [name] 12/02/2013   Procedure: COLONOSCOPY;  Surgeon: Irene Shipper, MD;  Location: [place];  Service: Endoscopy;  Laterality: [name];  . ESOPHAGOGASTRODUODENOSCOPY [name] 09/22/2014   Procedure: ESOPHAGOGASTRODUODENOSCOPY (EGD);  Surgeon: Ronald Lobo, MD;  Location: [facility] ENDOSCOPY;  Service: Endoscopy;  Laterality: [name];  . ESOPHAGOGASTRODUODENOSCOPY [name] 07/11/2016   Procedure: ESOPHAGOGASTRODUODENOSCOPY (EGD);  Surgeon: Doran Stabler, MD;  Location: [facility] ENDOSCOPY;  Service: Endoscopy;  Laterality: [name];  . INCISION AND DRAINAGE ABSCESS / HEMATOMA OF BURSA / KNEE / THIGH Left 1998   knee  . KNEE BURSECTOMY Left 1998  . LAPAROSCOPIC CHOLECYSTECTOMY  03/2010  . LEFT HEART CATH AND CORONARY ANGIOGRAPHY [name] 10/05/2017   Procedure: LEFT HEART CATH AND CORONARY ANGIOGRAPHY;  Surgeon: Martinique, Peter M, MD;  Location: [place] [facility];  Service: Cardiovascular;  Laterality: [name];  . NM MYOVIEW LTD  07/22/2013   Normal EF ~60%, no ischemia or infarction.  [name] PARTIAL GASTRECTOMY  1982   subtotal; "took out 30% for ulcers"  . TRANSTHORACIC ECHOCARDIOGRAM  02/16/2014   EF 60%, no RWMA. - otherwise normal  . YAG LASER APPLICATION Bilateral     Family History:  Family History  Problem Relation Age of Onset  . Breast cancer Mother   . Diabetes Mother   . Kidney disease Mother   . Heart attack Father   . Heart disease Father   . Heart attack Brother   . Leukemia Daughter     Social History:  reports that he has been smoking cigarettes. He has a 28.50 pack-year smoking history. He has never used smokeless  tobacco. He reports that he does not drink alcohol or use drugs.  Additional Social History:  Alcohol / Drug Use Pain Medications: See MAR Prescriptions: See MAR Over the Counter: See MAR History of alcohol / drug use?: No history of alcohol / drug abuse  CIWA: CIWA-Ar BP: (!) 142/86 Pulse Rate: (!) 56 COWS:    Allergies:  Allergies  Allergen Reactions  . Cyclobenzaprine Other (See Comments)    Unsteady gait    Home Medications: (Not in a hospital admission)   OB/GYN Status:  No LMP for male patient.  General Assessment Data Location of Assessment: [facility] ED TTS Assessment: In system Is this a Tele or Face-to-Face Assessment?: Face-to-Face Is this an Initial Assessment or a Re-assessment for this encounter?: Initial Assessment Patient Accompanied by:: [name] Language Other than English: No Living Arrangements: Other (Comment)(daughter and son-in-law. ) What gender do you identify as?: Male Marital status: Widowed Living Arrangements: Children, Other (Comment)(daughter  and son-in-law. ) Can pt return to current living arrangement?: Yes Admission Status: Voluntary Is patient capable of signing voluntary admission?: Yes Referral Source: Self/Family/Friend Insurance type: Medicare.      Crisis Care Plan Living Arrangements: Children, Other (Comment)(daughter and son-in-law. ) Legal Guardian: Other:(Self. ) Name of Psychiatrist: NA Name of Therapist: NA  Education Status Is patient currently in school?: No Is the patient employed, unemployed or receiving disability?: Receiving disability income  Risk to self with the past 6 months Suicidal Ideation: Yes-Currently Present Has patient been a risk to self within the past 6 months prior to admission? : Yes Suicidal Intent: Yes-Currently Present Has patient had any suicidal intent within the past 6 months prior to admission? : Yes Is patient at risk for suicide?: Yes Suicidal Plan?: Yes-Currently Present Has patient had  any suicidal plan within the past 6 months prior to admission? : Yes Specify Current Suicidal Plan: Overdose on pills.  Access to Means: Yes Specify Access to Suicidal Means: Pt has access to medications.  What has been your use of drugs/alcohol within the last 12 months?: UDS is negative.  Previous Attempts/Gestures: No How many times?: 0 Other Self Harm Risks: NA Triggers for Past Attempts: None known Intentional Self Injurious Behavior: None(Pt denies. ) Family Suicide History: No Recent stressful life event(s): Other (Comment)(Health, no enegry, increased urination. ) Persecutory voices/beliefs?: No Depression: Yes Depression Symptoms: Feeling worthless/self pity, Loss of interest in usual pleasures, Guilt, Fatigue, Isolating, Insomnia, Despondent Substance abuse history and/or treatment for substance abuse?: No Suicide prevention information given to non-admitted patients: Not applicable  Risk to Others within the past 6 months Homicidal Ideation: No(Pt denies. ) Does patient have any lifetime risk of violence toward others beyond the six months prior to admission? : No(Pt denies. ) Thoughts of Harm to Others: No(Pt denies. ) Current Homicidal Intent: No Current Homicidal Plan: No(Pt denies. ) Access to Homicidal Means: No Identified Victim: NA History of harm to others?: No Assessment of Violence: None Noted Violent Behavior Description: NA Does patient have access to weapons?: No(Pt denies. ) Criminal Charges Pending?: No Does patient have a court date: No Is patient on probation?: No  Psychosis Hallucinations: None noted Delusions: None noted  Mental Status Report Appearance/Hygiene: In scrubs Eye Contact: Good Motor Activity: Unremarkable Speech: Logical/coherent Level of Consciousness: Alert Mood: Depressed, Helpless, Despair Affect: Other (Comment)(congruent with mood. ) Anxiety Level: Moderate Thought Processes: Coherent, Relevant Judgement:  Partial Orientation: Person, Place, Time, Situation Obsessive Compulsive Thoughts/Behaviors: Minimal  Cognitive Functioning Concentration: Normal Memory: Recent Intact Is patient IDD: No Insight: Good Impulse Control: Fair Appetite: Poor Have you had any weight changes? : Loss Amount of the weight change? (lbs): 22 lbs(in three months. ) Sleep: Decreased Total Hours of Sleep: (Pt reported, not gettig much sleep in the past three months.) Vegetative Symptoms: Not bathing  ADLScreening [facility] Assessment Services) Patient's cognitive ability adequate to safely complete daily activities?: Yes Independently performs ADLs?: Yes (appropriate for developmental age)  Prior Inpatient Therapy Prior Inpatient Therapy: No  Prior Outpatient Therapy Prior Outpatient Therapy: No Does patient have an ACCT team?: No Does patient have Intensive In-House Services?  : No Does patient have [facility] services? : No Does patient have P4CC services?: No  ADL Screening (condition at time of admission) Patient's cognitive ability adequate to safely complete daily activities?: Yes Is the patient deaf or have difficulty hearing?: No Does the patient have difficulty seeing, even when wearing glasses/contacts?: Yes(Pt is legally blind in his left  eye. Pt needs glasses. ) Does the patient have difficulty concentrating, remembering, or making decisions?: Yes Does the patient have difficulty dressing or bathing?: No Independently performs ADLs?: Yes (appropriate for developmental age) Does the patient have difficulty walking or climbing stairs?: No Weakness of Legs: None Weakness of Arms/Hands: None  Home Assistive Devices/Equipment Home Assistive Devices/Equipment: Eyeglasses(Pt reported, he needs glasses. )    Abuse/Neglect Assessment (Assessment to be complete while patient is alone) Abuse/Neglect Assessment Can Be Completed: Yes Physical Abuse: Denies(Pt denies. ) Verbal Abuse: Denies(Pt denies.  ) Sexual Abuse: Denies(Pt denies. ) Exploitation of patient/patient's resources: Denies(Pt denies. ) Self-Neglect: Denies(Pt denies. )     Advance Directives (For Healthcare) Does Patient Have a Medical Advance Directive?: No Would patient like information on creating a medical advance directive?: No - Patient declined          Disposition: Lindon Romp, NP recommends gerio-psychiatric inpatient treatment. Disposition discussed with Dewitt Hoes, PA and Rica Mote, RN.    Disposition Initial Assessment Completed for this Encounter: Yes  On Site Evaluation by: Vertell Novak, MS, LPC, CRC. Reviewed with Physician: Dewitt Hoes, PA and Lindon Romp, NP.  Vertell Novak 04/14/2018 8:54 PM    Vertell Novak, MS, [facility], [facility] Triage Specialist [phone]

## 2018-04-14 NOTE — Progress Notes (Signed)
PHARMACIST - PHYSICIAN ORDER COMMUNICATION  CONCERNING: P&T Medication Policy on Herbal Medications  DESCRIPTION:  This patient's order for:  Co-Q 10  has been noted.  This product(s) is classified as an "herbal" or natural product. Due to a lack of definitive safety studies or FDA approval, nonstandard manufacturing practices, plus the potential risk of unknown drug-drug interactions while on inpatient medications, the Pharmacy and Therapeutics Committee does not permit the use of "herbal" or natural products of this type within [place].   ACTION TAKEN: The pharmacy department is unable to verify this order at this time. Please reevaluate patient's clinical condition at discharge and address if the herbal or natural product(s) should be resumed at that time.  Lenis Noon, PharmD 04/14/18 7:21 PM

## 2018-04-14 NOTE — ED Notes (Addendum)
In and Out Cath results 830ml

## 2018-04-14 NOTE — ED Provider Notes (Signed)
[facility] DEPT Provider Note   CSN: [id number] Arrival date & time: 04/14/18  1609     History   Chief Complaint Chief Complaint  Patient presents with  . Medical Clearance  . Insomnia    HPI [name] is a 78 y.o. male with a PMHx of Aflutter/PAFib, arthritis, anemia, COPD, DM2, CHF, HTN, GERD, HLD, and other conditions listed below, who presents to the ED with complaints of suicidal ideations with a plan to overdose on pills.  Patient states that for the last 5 months he has been depressed, not sleeping well, and having difficulty with his memory.  He states that at this time he wants to harm himself, and has a plan to overdose on pills.  He has never seen a therapist and is not on any psychiatric medications.  He states that he has tried to talk to social work but has never been successful during his prior ED visits.  He states that occasionally he gets a pressure in the front of his head which has been going on for a long time and there have not been any acute changes in this, he states that it is intermittent, and not currently ongoing.  He has not tried anything for it, no known aggravating factors.  He also states that he has had stinging dysuria for "a long time", he has seen his PCP for this and been on multiple rounds of antibiotics.  He is very vague on when this was and if this is still ongoing.  Of note, pt seen yesterday for BRBPR, labs reassuring (K low but repleted), felt to be due to hemorrhoids.  He denies any HI, AVH, illicit drug use, alcohol use, or tobacco use.  He also denies any fevers, chills, chest pain, shortness of breath, abdominal pain, nausea, vomiting, diarrhea, constipation, hematuria, vision changes, numbness, tingling, focal weakness, or any other complaints at this time.  The history is provided by the patient and medical records. No language interpreter was used.  Insomnia  Pertinent negatives include no chest pain, no  abdominal pain, no headaches (none currently) and no shortness of breath.    Past Medical History:  Diagnosis Date  . Allergic rhinitis   . Anemia    takes Ferrous Sulfate daily  . Arthritis    "all over"  . Atrial flutter ([place])   . Balance problem 01/2014  . CAP (community acquired pneumonia) 09/18/2014  . Cervical radiculopathy due to degenerative joint disease of spine   . Chronic coronary artery disease    a. 03/2010 Nonocclusive disease by cath, performed for ST elevations on ECG;  b. 06/2013 Lexi MV: EF 60%, no ischemia.  [name] COPD (chronic obstructive pulmonary disease) ([place])   . Diabetes mellitus type II    takes Metformin and Lantus daily  . Diastolic CHF, chronic ([place])    a. 12/2012 EF 55-60%, diast dysfxn, triv MR, mildly dil LA/RA.  [name] Essential hypertension    takes Diltiazem daily  . Frail elderly   . GERD (gastroesophageal reflux disease)   . History of blood transfusion 1982   "when I had stomach OR"  . History of bronchitis    1998  . History of CVA (cerebrovascular accident)   . History of gastric ulcer   . Hypercholesterolemia    takes Pravastatin daily  . Intrinsic eczema   . Legally blind in left eye, as defined in [place]   . Obesity   . PAF (paroxysmal atrial fibrillation) ([place])  Recurrent after atrial flutter (a. 07/2010 Status post caval tricuspid isthmus ablation by Dr. Midge Aver Metoprolol daily), currently controlled on flecainide plus diltiazem and Coumadin  . Vitamin D deficiency   . Weakness    numbness and tingling both hands    Patient Active Problem List   Diagnosis Date Noted  . Rapid atrial fibrillation ([place]) 01/18/2018  . Atrial fibrillation with RVR ([place]) 11/08/2017  . Chest pain with moderate risk of acute coronary syndrome 10/04/2017  . HTN (hypertension) 05/21/2017  . Diabetes mellitus type II 05/21/2017  . Diastolic CHF, chronic ([place]) 05/21/2017  . COPD (chronic obstructive pulmonary disease) ([place]) 05/21/2017  . H/O atrial flutter  05/21/2017  . Coronary disease/nonobstructive 05/21/2017  . Acute blood loss anemia 10/02/2016  . Abdominal pain 10/02/2016  . Constipation 10/02/2016  . Left lower quadrant pain   . Coagulopathy ([place])   . Heme positive stool   . Symptomatic anemia 07/10/2016  . Syncope 05/15/2015  . Cervical disc disease 05/15/2015  . Abnormal urinalysis 05/15/2015  . Anemia 05/15/2015  . History of CVA (cerebrovascular accident) 04/03/2014  . Near syncope 02/15/2014  . Pre-syncope 02/15/2014  . Benign neoplasm of rectum and anal canal 12/02/2013  . Upper GI bleeding 11/30/2013  . Tobacco use disorder 11/30/2013  . Anticoagulation goal of INR 2 to 3, for PAF - CHA2DS2Vasc = 7 08/15/2013  . Type 2 diabetes mellitus ([place]) 01/22/2013  . Tobacco abuse counseling 12/15/2012  . Hyperlipidemia   . Obesity (BMI 30-39.9) 12/14/2012  . Persistent atrial fibrillation ([place]) 10/07/2010  . Atrial flutter - status post CTI ablation 07/29/2010  . Essential hypertension 07/29/2010  . Chronic diastolic congestive heart failure ([place]) 07/29/2010  . Coronary artery disease, non-occlusive 07/29/2010    Past Surgical History:  Procedure Laterality Date  . ATRIAL ABLATION SURGERY  08/05/10   CTI ablation for atrial flutter by JA  . CARDIAC CATHETERIZATION  2012   nl LV function, no occlusive CAD, PAF  . CARDIOVERSION  12/07/2010    Successful direct current cardioversion with atrial fibrillation to normal sinus rhythm  . CARPAL TUNNEL RELEASE Bilateral 01/30/2014   Procedure: BILATERAL CARPAL TUNNEL RELEASE;  Surgeon: Marianna Payment, MD;  Location: [facility];  Service: Orthopedics;  Laterality: Bilateral;  . CATARACT EXTRACTION W/ INTRAOCULAR LENS  IMPLANT, BILATERAL Bilateral   . COLONOSCOPY [name] 12/02/2013   Procedure: COLONOSCOPY;  Surgeon: Irene Shipper, MD;  Location: [facility];  Service: Endoscopy;  Laterality: [name];  . ESOPHAGOGASTRODUODENOSCOPY [name] 09/22/2014   Procedure: ESOPHAGOGASTRODUODENOSCOPY (EGD);   Surgeon: Ronald Lobo, MD;  Location[facility] ENDOSCOPY;  Service: Endoscopy;  Laterality: [name];  . ESOPHAGOGASTRODUODENOSCOPY [name] 07/11/2016   Procedure: ESOPHAGOGASTRODUODENOSCOPY (EGD);  Surgeon: Doran Stabler, MD;  Location: [facility] ENDOSCOPY;  Service: Endoscopy;  Laterality: [name];  . INCISION AND DRAINAGE ABSCESS / HEMATOMA OF BURSA / KNEE / THIGH Left 1998   knee  . KNEE BURSECTOMY Left 1998  . LAPAROSCOPIC CHOLECYSTECTOMY  03/2010  . LEFT HEART CATH AND CORONARY ANGIOGRAPHY [name] 10/05/2017   Procedure: LEFT HEART CATH AND CORONARY ANGIOGRAPHY;  Surgeon: Martinique, Peter M, MD;  Location: [facility] CV LAB;  Service: Cardiovascular;  Laterality: [name];  . NM MYOVIEW LTD  07/22/2013   Normal EF ~60%, no ischemia or infarction.  [name] PARTIAL GASTRECTOMY  1982   subtotal; "took out 30% for ulcers"  . TRANSTHORACIC ECHOCARDIOGRAM  02/16/2014   EF 60%, no RWMA. - otherwise normal  . YAG LASER APPLICATION Bilateral  Home Medications    Prior to Admission medications   Medication Sig Start Date End Date Taking? Authorizing Provider  albuterol (PROVENTIL HFA;VENTOLIN HFA) 108 (90 Base) MCG/ACT inhaler Inhale 2 puffs into the lungs every 4 (four) hours as needed for wheezing or shortness of breath. 10/30/17   Palumbo, April, MD  aspirin EC 81 MG tablet Take 81 mg by mouth daily.    [provider]  ciprofloxacin (CIPRO) 500 MG tablet Take 1 tablet (500 mg total) by mouth 2 (two) times daily for 7 days. 04/10/18 04/17/18  Scot Jun, FNP  clotrimazole-betamethasone (LOTRISONE) cream Apply 1 application topically 2 (two) times daily. 04/04/18   Scot Jun, FNP  co-enzyme Q-10 30 MG capsule Take 30 mg by mouth daily.     [provider]  diclofenac sodium (VOLTAREN) 1 % GEL Apply 4 g topically 3 (three) times daily as needed (for back pain). 11/10/17   Rory Percy, DO  diltiazem (CARDIZEM CD) 300 MG 24 hr capsule Take 1 capsule (300 mg total) by mouth daily. 02/19/18    Camnitz, Will Hassell Done, MD  diltiazem (CARDIZEM) 30 MG tablet TAKE 1 TABLET BY MOUTH 4 TIMES DAILY AS NEEDED FOR AFIB Patient not taking: Reported on 04/13/2018 03/29/18   Camnitz, Ocie Doyne, MD  ferrous sulfate 325 (65 FE) MG tablet Take 1 tablet by mouth 4 (four) times a week. Takes M/T/W/F 02/14/18   [provider]  glucose blood (PRECISION QID TEST) test strip Check glucose twice daily 10/24/13   [provider]  hydrocortisone cream 1 % Apply to affected area 2 times daily 12/24/17   Maczis, Barth Kirks, PA-C  magnesium hydroxide (DULCOLAX MILK OF MAGNESIA) 400 MG/5ML suspension Take 5 mLs by mouth daily as needed for mild constipation.    [provider]  Melatonin 3 MG TABS Take 1 tablet (3 mg total) by mouth at bedtime. Patient not taking: Reported on 04/13/2018 11/16/17   Doristine Mango L, DO  Multiple Vitamin (MULTIVITAMIN WITH MINERALS) TABS tablet Take 1 tablet by mouth 2 (two) times daily.     [provider]  nitroGLYCERIN (NITROSTAT) 0.4 MG SL tablet Place 1 tablet (0.4 mg total) under the tongue every 5 (five) minutes x 3 doses as needed for chest pain. 11/19/15   Erlene Quan, PA-C  nystatin cream (MYCOSTATIN) Apply to affected area 2 times daily 01/02/18   Deno Etienne, DO  pantoprazole (PROTONIX) 40 MG tablet Take 1 tablet (40 mg total) by mouth 2 (two) times daily before a meal. 11/30/16   Domenic Polite, MD  potassium chloride (K-DUR) 10 MEQ tablet Take 2 tablets (20 mEq total) by mouth daily for 2 days. 04/13/18 04/15/18  Nuala Alpha A, PA-C  pravastatin (PRAVACHOL) 40 MG tablet Take 1 tablet (40 mg total) by mouth every evening. 05/04/17   Leonie Man, MD  vitamin C (ASCORBIC ACID) 250 MG tablet Take 1 tablet (250 mg total) by mouth daily. Patient taking differently: Take 250 mg by mouth 3 (three) times a week.  01/20/18   Rise Mu, PA-C    Family History Family History  Problem Relation Age of Onset  . Breast cancer Mother   .  Diabetes Mother   . Kidney disease Mother   . Heart attack Father   . Heart disease Father   . Heart attack Brother   . Leukemia Daughter     Social History Social History   Tobacco Use  . Smoking status: Current Every  Day Smoker    Packs/day: 0.50    Years: 57.00    Pack years: 28.50    Types: Cigarettes  . Smokeless tobacco: Never Used  . Tobacco comment: He's been smoking between 0.5 and 2 ppd since age 66.  Substance Use Topics  . Alcohol use: No    Alcohol/week: 0.0 standard drinks    Comment: "quit drinking in 1986"  . Drug use: No     Allergies   Cyclobenzaprine   Review of Systems Review of Systems  Constitutional: Negative for chills and fever.  Eyes: Negative for visual disturbance.  Respiratory: Negative for shortness of breath.   Cardiovascular: Negative for chest pain.  Gastrointestinal: Negative for abdominal pain, constipation, diarrhea, nausea and vomiting.  Genitourinary: Positive for dysuria. Negative for hematuria.  Musculoskeletal: Negative for arthralgias and myalgias.  Skin: Negative for color change.  Allergic/Immunologic: Positive for immunocompromised state (DM2).  Neurological: Negative for weakness, numbness and headaches (none currently).  Psychiatric/Behavioral: Positive for sleep disturbance and suicidal ideas. Negative for hallucinations. The patient has insomnia.        +memory issues   All other systems reviewed and are negative for acute change except as noted in the HPI.    Physical Exam Updated Vital Signs BP (!) 142/86 (BP Location: Right Arm)   Pulse (!) 56   Temp 98 F (36.7 C) (Oral)   Resp 16   SpO2 100%   Physical Exam Vitals signs and nursing note reviewed.  Constitutional:      General: He is not in acute distress.    Appearance: Normal appearance. He is well-developed. He is not toxic-appearing.     Comments: Afebrile, nontoxic, NAD  HENT:     Head: Normocephalic and atraumatic.  Eyes:     General: Vision  grossly intact.        Right eye: No discharge.        Left eye: No discharge.     Extraocular Movements: Extraocular movements intact.     Conjunctiva/sclera: Conjunctivae normal.     Pupils: Pupils are equal, round, and reactive to light.     Comments: PERRL, L eye with leftward deviation at baseline per pt however EOMI bilaterally, no nystagmus noted.   Neck:     Musculoskeletal: Normal range of motion and neck supple. Normal range of motion. No neck rigidity, spinous process tenderness or muscular tenderness.     Comments: FROM intact without spinous process TTP, no bony stepoffs or deformities, no paraspinous muscle TTP or muscle spasms. No rigidity or meningeal signs. No bruising or swelling.  Cardiovascular:     Rate and Rhythm: Regular rhythm. Bradycardia present.     Pulses: Normal pulses.     Heart sounds: Normal heart sounds, S1 normal and S2 normal. No murmur. No friction rub. No gallop.      Comments: Occasionally bradycardic, reg rhythm during exam, nl s1/s2, no m/r/g, distal pulses intact, no pedal edema Pulmonary:     Effort: Pulmonary effort is normal. No respiratory distress.     Breath sounds: Normal breath sounds. No decreased breath sounds, wheezing, rhonchi or rales.     Comments: Scattered rhonchi that clears with coughing, otherwise CTAB with no w/r/r appreciated after he coughs. No hypoxia or increased WOB, speaking in full sentences, SpO2 100% on RA  Abdominal:     General: Bowel sounds are normal. There is no distension.     Palpations: Abdomen is soft. Abdomen is not rigid.     Tenderness:  There is abdominal tenderness in the suprapubic area. There is no right CVA tenderness, left CVA tenderness, guarding or rebound. Negative signs include Murphy's sign and McBurney's sign.     Comments: Soft, nondistended, +BS throughout, with mild suprapubic discomfort but pt states he needs to urinate; no other areas of TTP to remainder of abdomen; no r/g/r, neg murphy's, neg  mcburney's, no CVA TTP   Musculoskeletal: Normal range of motion.     Comments: MAE x4 Strength and sensation grossly intact in all extremities Distal pulses intact Gait steady  Skin:    General: Skin is warm and dry.     Findings: No rash.  Neurological:     General: No focal deficit present.     Mental Status: He is alert and oriented to person, place, and time.     GCS: GCS eye subscore is 4. GCS verbal subscore is 5. GCS motor subscore is 6.     Cranial Nerves: Cranial nerves are intact. No cranial nerve deficit.     Sensory: Sensation is intact. No sensory deficit.     Motor: Motor function is intact.     Coordination: Coordination is intact. Coordination normal.     Gait: Gait normal.     Comments: CN 2-12 grossly intact A&O x4 GCS 15 Sensation and strength intact Gait nonataxic Coordination WNL Neg pronator drift   Psychiatric:        Attention and Perception: He does not perceive auditory or visual hallucinations.        Mood and Affect: Affect normal. Mood is depressed.        Behavior: Behavior normal.        Thought Content: Thought content includes suicidal ideation. Thought content does not include homicidal ideation. Thought content includes suicidal plan. Thought content does not include homicidal plan.     Comments: Depressed mood/affect, but pleasant and cooperative. Endorsing SI with a plan, denies HI or AVH, doesn't seem to be responding to internal stimuli.       ED Treatments / Results  Labs (all labs ordered are listed, but only abnormal results are displayed) Labs Reviewed  COMPREHENSIVE METABOLIC PANEL - Abnormal; Notable for the following components:      Result Value   Potassium 3.0 (*)    Glucose, Bld 203 (*)    All other components within normal limits  ACETAMINOPHEN LEVEL - Abnormal; Notable for the following components:   Acetaminophen (Tylenol), Serum <10 (*)    All other components within normal limits  CBC WITH DIFFERENTIAL/PLATELET -  Abnormal; Notable for the following components:   RBC 3.65 (*)    Hemoglobin 10.5 (*)    HCT 33.6 (*)    All other components within normal limits  URINALYSIS, ROUTINE W REFLEX MICROSCOPIC - Abnormal; Notable for the following components:   Ketones, ur 5 (*)    All other components within normal limits  ETHANOL  SALICYLATE LEVEL  RAPID URINE DRUG SCREEN, HOSP PERFORMED    EKG EKG Interpretation  Date/Time:  Sunday April 14 2018 17:50:40 EST Ventricular Rate:  93 PR Interval:    QRS Duration: 86 QT Interval:  370 QTC Calculation: 460 R Axis:   37 Text Interpretation:  Atrial flutter with variable A-V block with premature ventricular or aberrantly conducted complexes Abnormal ECG No significant change was found hx of atrial flutter/fibrillation Confirmed by Jola Schmidt [phone]) on 04/14/2018 5:56:50 PM   Radiology Dg Chest 2 View  Result Date: 04/14/2018 CLINICAL DATA:  Patient is  being evaluated for psychiatric issues EXAM: CHEST - 2 VIEW COMPARISON:  April 12, 2018 FINDINGS: The heart size and mediastinal contours are within normal limits. Both lungs are clear. The visualized skeletal structures are stable. IMPRESSION: No active cardiopulmonary disease. Electronically Signed   By: Abelardo Diesel M.D.   On: 04/14/2018 17:24   Ct Head Wo Contrast  Result Date: 04/14/2018 CLINICAL DATA:  Headaches and mental difficulty EXAM: CT HEAD WITHOUT CONTRAST TECHNIQUE: Contiguous axial images were obtained from the base of the skull through the vertex without intravenous contrast. COMPARISON:  10/23/2015 FINDINGS: Brain: Mild atrophic changes and chronic white matter ischemic changes are seen. No findings to suggest acute hemorrhage, acute infarction or space-occupying mass lesion are noted. Lacunar infarcts are noted 1 on the right adjacent to the lateral ventricle, 1 in the head of the caudate nucleus on the right and 1 in the subinsular cortex on the left. These are stable from the prior  exam. Vascular: No hyperdense vessel or unexpected calcification. Skull: Normal. Negative for fracture or focal lesion. Sinuses/Orbits: No acute finding. Other: None. IMPRESSION: Chronic changes without acute abnormality. Electronically Signed   By: Inez Catalina M.D.   On: 04/14/2018 17:20    Procedures Procedures (including critical care time)  Medications Ordered in ED Medications  tamsulosin (FLOMAX) capsule 0.4 mg (0.4 mg Oral Given 04/14/18 1839)  albuterol (PROVENTIL HFA;VENTOLIN HFA) 108 (90 Base) MCG/ACT inhaler 2 puff (has no administration in time range)  aspirin EC tablet 81 mg (has no administration in time range)  clotrimazole-betamethasone (LOTRISONE) cream 1 application (has no administration in time range)  co-enzyme Q-10 capsule 30 mg (has no administration in time range)  diclofenac sodium (VOLTAREN) 1 % transdermal gel 4 g (has no administration in time range)  diltiazem (CARDIZEM CD) 24 hr capsule 300 mg (has no administration in time range)  ferrous sulfate tablet 325 mg (has no administration in time range)  magnesium hydroxide (MILK OF MAGNESIA) suspension 5 mL (has no administration in time range)  multivitamin with minerals tablet 1 tablet (has no administration in time range)  nitroGLYCERIN (NITROSTAT) SL tablet 0.4 mg (has no administration in time range)  pantoprazole (PROTONIX) EC tablet 40 mg (has no administration in time range)  pravastatin (PRAVACHOL) tablet 40 mg (has no administration in time range)  acetaminophen (TYLENOL) tablet 650 mg (has no administration in time range)  ondansetron (ZOFRAN) tablet 4 mg (has no administration in time range)  alum & mag hydroxide-simeth (MAALOX/MYLANTA) 200-200-20 MG/5ML suspension 30 mL (has no administration in time range)  potassium chloride SA (K-DUR,KLOR-CON) CR tablet 60 mEq (60 mEq Oral Given 04/14/18 1839)     Initial Impression / Assessment and Plan / ED Course  I have reviewed the triage vital signs and the  nursing notes.  Pertinent labs & imaging results that were available during my care of the patient were reviewed by me and considered in my medical decision making (see chart for details).     78 y.o. male here with SI with a plan to OD, states he's been having depression for several months, insomnia and memory issues.  He has not seen a therapist and is not on any psychiatric medications at this time.  He denies HI, AVH, illicit drug use, alcohol use, or tobacco use.  He states that he occasionally gets a pressure in the front of his head, which is been ongoing for a long time, intermittent, not currently present.  He also reports some stinging dysuria  for a long time as well, has been seen by his PCP multiple times and given several antibiotics.  On exam, no focal neuro deficits, left eye with leftward deviation/gaze at baseline per patient but still moves in all directions; scattered rhonchi which clears with cough; A&O x4; gait steady; mild suprapubic TTP but nonperitoneal, and pt states he needs to use the restroom. Will get psych clearance labs including CXR, EKG, U/A, and CT head. Will also bladder scan after voiding. Will reassess shortly. Discussed case with my attending Dr. Venora Maples who agrees with plan.   6:58 PM CBC w/diff with chronic stable anemia. CMP with K 3.0 will replete here, gluc 203 but otherwise unremarkable. EtOH level undetectable. Salicylate and acetaminophen levels WNL. U/A without evidence of UTI. UDS unremarkable. CXR negative.  CT head negative for acute findings, shows Mild atrophic changes and chronic white matter ischemic changes but nothing new. EKG with aflutter but no acute ischemic findings. Bladder scan with 44mL post-void, will place on flomax as this appears to be a chronic issue, doubt need for foley placement currently since this is not acute. Will have him f/up with urology when he is released. Pt medically cleared at this time. Psych hold orders and home med orders  placed. Please see TTS notes for further documentation of care/dispo. PLEASE NOTE THAT PT IS HERE VOLUNTARILY AT THIS TIME, IF PT TRIES TO LEAVE THEY WOULD NEED REPEAT EVAL TO SEE IF IVC PAPERWORK NEED TO BE TAKEN OUT. Pt stable at time of med clearance.     Final Clinical Impressions(s) / ED Diagnoses   Final diagnoses:  Suicidal ideation  Medical clearance for psychiatric admission  Intermittent headache  Type 2 diabetes mellitus with hyperglycemia, unspecified whether long term insulin use [facility])  Hypokalemia    ED Discharge Orders    [address], [place], [place] 04/14/18 Rutha Bouchard, MD 04/14/18 2139

## 2018-04-14 NOTE — ED Notes (Signed)
Bladder Scan Patient = 567 ml

## 2018-04-14 NOTE — ED Notes (Signed)
Patient transported to CT 

## 2018-04-15 ENCOUNTER — Emergency Department (HOSPITAL_COMMUNITY): Payer: Medicare Other

## 2018-04-15 ENCOUNTER — Telehealth: Payer: Self-pay | Admitting: Cardiology

## 2018-04-15 DIAGNOSIS — I10 Essential (primary) hypertension: Secondary | ICD-10-CM | POA: Diagnosis not present

## 2018-04-15 DIAGNOSIS — Z87891 Personal history of nicotine dependence: Secondary | ICD-10-CM | POA: Diagnosis not present

## 2018-04-15 DIAGNOSIS — E1165 Type 2 diabetes mellitus with hyperglycemia: Secondary | ICD-10-CM | POA: Diagnosis not present

## 2018-04-15 DIAGNOSIS — I2510 Atherosclerotic heart disease of native coronary artery without angina pectoris: Secondary | ICD-10-CM | POA: Diagnosis present

## 2018-04-15 DIAGNOSIS — F332 Major depressive disorder, recurrent severe without psychotic features: Secondary | ICD-10-CM | POA: Diagnosis present

## 2018-04-15 DIAGNOSIS — E119 Type 2 diabetes mellitus without complications: Secondary | ICD-10-CM | POA: Diagnosis present

## 2018-04-15 DIAGNOSIS — K219 Gastro-esophageal reflux disease without esophagitis: Secondary | ICD-10-CM | POA: Diagnosis present

## 2018-04-15 DIAGNOSIS — D649 Anemia, unspecified: Secondary | ICD-10-CM | POA: Diagnosis present

## 2018-04-15 DIAGNOSIS — N390 Urinary tract infection, site not specified: Secondary | ICD-10-CM | POA: Diagnosis present

## 2018-04-15 DIAGNOSIS — K529 Noninfective gastroenteritis and colitis, unspecified: Secondary | ICD-10-CM | POA: Diagnosis not present

## 2018-04-15 DIAGNOSIS — R079 Chest pain, unspecified: Secondary | ICD-10-CM | POA: Diagnosis present

## 2018-04-15 DIAGNOSIS — F17210 Nicotine dependence, cigarettes, uncomplicated: Secondary | ICD-10-CM | POA: Diagnosis present

## 2018-04-15 DIAGNOSIS — J449 Chronic obstructive pulmonary disease, unspecified: Secondary | ICD-10-CM | POA: Diagnosis present

## 2018-04-15 DIAGNOSIS — I483 Typical atrial flutter: Secondary | ICD-10-CM | POA: Diagnosis not present

## 2018-04-15 DIAGNOSIS — N401 Enlarged prostate with lower urinary tract symptoms: Secondary | ICD-10-CM | POA: Diagnosis present

## 2018-04-15 DIAGNOSIS — F329 Major depressive disorder, single episode, unspecified: Secondary | ICD-10-CM | POA: Diagnosis present

## 2018-04-15 DIAGNOSIS — E7800 Pure hypercholesterolemia, unspecified: Secondary | ICD-10-CM | POA: Diagnosis not present

## 2018-04-15 DIAGNOSIS — F32A Depression, unspecified: Secondary | ICD-10-CM | POA: Diagnosis present

## 2018-04-15 DIAGNOSIS — K5900 Constipation, unspecified: Secondary | ICD-10-CM | POA: Diagnosis present

## 2018-04-15 DIAGNOSIS — T83098A Other mechanical complication of other indwelling urethral catheter, initial encounter: Secondary | ICD-10-CM | POA: Diagnosis not present

## 2018-04-15 DIAGNOSIS — S79912A Unspecified injury of left hip, initial encounter: Secondary | ICD-10-CM | POA: Diagnosis not present

## 2018-04-15 DIAGNOSIS — M25552 Pain in left hip: Secondary | ICD-10-CM | POA: Diagnosis not present

## 2018-04-15 DIAGNOSIS — R45851 Suicidal ideations: Secondary | ICD-10-CM | POA: Diagnosis present

## 2018-04-15 DIAGNOSIS — E876 Hypokalemia: Secondary | ICD-10-CM | POA: Diagnosis not present

## 2018-04-15 DIAGNOSIS — R338 Other retention of urine: Secondary | ICD-10-CM | POA: Diagnosis present

## 2018-04-15 DIAGNOSIS — R339 Retention of urine, unspecified: Secondary | ICD-10-CM | POA: Diagnosis not present

## 2018-04-15 DIAGNOSIS — F0281 Dementia in other diseases classified elsewhere with behavioral disturbance: Secondary | ICD-10-CM | POA: Diagnosis not present

## 2018-04-15 DIAGNOSIS — G309 Alzheimer's disease, unspecified: Secondary | ICD-10-CM | POA: Diagnosis not present

## 2018-04-15 DIAGNOSIS — I5032 Chronic diastolic (congestive) heart failure: Secondary | ICD-10-CM | POA: Diagnosis present

## 2018-04-15 DIAGNOSIS — B3742 Candidal balanitis: Secondary | ICD-10-CM | POA: Diagnosis not present

## 2018-04-15 DIAGNOSIS — I110 Hypertensive heart disease with heart failure: Secondary | ICD-10-CM | POA: Diagnosis present

## 2018-04-15 DIAGNOSIS — R51 Headache: Secondary | ICD-10-CM | POA: Diagnosis not present

## 2018-04-15 DIAGNOSIS — I4892 Unspecified atrial flutter: Secondary | ICD-10-CM | POA: Diagnosis present

## 2018-04-15 LAB — CBG MONITORING, ED: Glucose-Capillary: 196 mg/dL — ABNORMAL HIGH (ref 70–99)

## 2018-04-15 MED ORDER — ESCITALOPRAM OXALATE 10 MG PO TABS
10.0000 mg | ORAL_TABLET | Freq: Every day | ORAL | Status: DC
  Administered 2018-04-15: 10 mg via ORAL
  Filled 2018-04-15: qty 1

## 2018-04-15 NOTE — ED Provider Notes (Signed)
Pt accepted to [facility] geropsychiatric facility. Pt has had significant urinary retention and straight catheterized more than once while in the ED here. UA and metabolic panel reviewed. Both ok. I was advised that [facility] will not take with with a foley catheter in place but as a geropsychiatric facility they have ability to assess this need and place one if providers there feel that it is still needed.    Virgel Manifold, MD 04/15/18 1357

## 2018-04-15 NOTE — Telephone Encounter (Signed)
Please review for refill. Thanks!  

## 2018-04-15 NOTE — ED Notes (Signed)
Pt was moved from [place] [facility] to Room 32 in [place]. Pt ambulated and willingly. Pt had been sleeping soundly, and sts he will go right back to sleep after he gets a drink.

## 2018-04-15 NOTE — ED Notes (Signed)
Pt denies any SI at this time. Pt sts he just wants to go back to sleep, denies any needs. No SI/HI, No AH, VH.  Pt cooperative with staff.

## 2018-04-15 NOTE — Telephone Encounter (Signed)
Let dtr know that we were aware of pt's current adx.  Informed that I would not cancel appt for tomorrow morning incase pt gets d/c today although we understand that is not likely. Asked her to call the office as soon as they know pt will be d/c and we will reschedule him to see Camnitz ASAP. She is agreeable to plan.

## 2018-04-15 NOTE — BH Assessment (Signed)
[facility] Assessment Progress Note  Per Buford Dresser, DO, this pt requires psychiatric hospitalization at this time.  At 12:00 Ebony Hail calls from [facility].  Pt has been accepted to their main campus at [place], [place], by Dr Jonelle Sports.  Waylan Boga, DNP, concurs with this disposition, as does the pt who is currently under voluntary status.  Pt's nurse has been notified, and agrees to call report to [phone].  Pt is to be transported via Stacey Drain, [place] Coordinator [phone]

## 2018-04-15 NOTE — ED Notes (Signed)
Pt c/o left hip pain. Pt sts he fell on his hip 3-4 days ago. Pt thinks he may have broken his hip.  Xray ordered per Dr. Roxanne Mins. Pt does ambulate to bathroom.

## 2018-04-15 NOTE — ED Provider Notes (Signed)
Patient complaining of left hip pain from a fall 2-3 days ago. He is sent for x-rays.  X-rays show no evidence of fracture.   Trevor Fuel, MD 71/42/32 [phone]

## 2018-04-15 NOTE — ED Notes (Signed)
Phone report given to Nunzio Cory, Therapist, sports at [facility].

## 2018-04-15 NOTE — ED Notes (Signed)
Bed: WA32 Expected date:  Expected time:  Means of arrival:  Comments: 

## 2018-04-15 NOTE — Consult Note (Signed)
[facility] Consult   Reason for Consult:  SI Referring Physician:  EDP Patient Identification: [name] MRN:  [id number] Principal Diagnosis: Depression Diagnosis:  Principal Problem:   Depression   Total Time spent with patient: 30 minutes  Subjective:   [name] is a 78 y.o. male patient admitted with SI.  HPI:   Per chart review, patient was admitted with depression and SI in the setting of multiple medical problems. He reports worsening depression over the past 5 months, poor energy and memory impairment. On interview, he reports that his daughter cut him out of her life. He is unable to safety plan and reports that he knows that he will harm himself if he returns home. He feels hopeless. He denies a prior history of suicide attempts.   Past Psychiatric History: None   Risk to Self: Suicidal Ideation: Yes-Currently Present Suicidal Intent: Yes-Currently Present Is patient at risk for suicide?: Yes Suicidal Plan?: Yes-Currently Present Specify Current Suicidal Plan: Overdose on pills.  Access to Means: Yes Specify Access to Suicidal Means: Pt has access to medications.  What has been your use of drugs/alcohol within the last 12 months?: UDS is negative.  How many times?: 0 Other Self Harm Risks: NA Triggers for Past Attempts: None known Intentional Self Injurious Behavior: None(Pt denies. ) Risk to Others: Homicidal Ideation: No(Pt denies. ) Thoughts of Harm to Others: No(Pt denies. ) Current Homicidal Intent: No Current Homicidal Plan: No(Pt denies. ) Access to Homicidal Means: No Identified Victim: NA History of harm to others?: No Assessment of Violence: None Noted Violent Behavior Description: NA Does patient have access to weapons?: No(Pt denies. ) Criminal Charges Pending?: No Does patient have a court date: No Prior Inpatient Therapy: Prior Inpatient Therapy: No Prior Outpatient Therapy: Prior Outpatient Therapy: No Does patient  have an ACCT team?: No Does patient have Intensive In-House Services?  : No Does patient have [facility] services? : No Does patient have P4CC services?: No  Past Medical History:  Past Medical History:  Diagnosis Date  . Allergic rhinitis   . Anemia    takes Ferrous Sulfate daily  . Arthritis    "all over"  . Atrial flutter ([place])   . Balance problem 01/2014  . CAP (community acquired pneumonia) 09/18/2014  . Cervical radiculopathy due to degenerative joint disease of spine   . Chronic coronary artery disease    a. 03/2010 Nonocclusive disease by cath, performed for ST elevations on ECG;  b. 06/2013 Lexi MV: EF 60%, no ischemia.  [name] COPD (chronic obstructive pulmonary disease) ([place])   . Diabetes mellitus type II    takes Metformin and Lantus daily  . Diastolic CHF, chronic ([place])    a. 12/2012 EF 55-60%, diast dysfxn, triv MR, mildly dil LA/RA.  [name] Essential hypertension    takes Diltiazem daily  . Frail elderly   . GERD (gastroesophageal reflux disease)   . History of blood transfusion 1982   "when I had stomach OR"  . History of bronchitis    1998  . History of CVA (cerebrovascular accident)   . History of gastric ulcer   . Hypercholesterolemia    takes Pravastatin daily  . Intrinsic eczema   . Legally blind in left eye, as defined in [place]   . Obesity   . PAF (paroxysmal atrial fibrillation) (HCC)    Recurrent after atrial flutter (a. 07/2010 Status post caval tricuspid isthmus ablation by Dr. Midge Aver Metoprolol daily), currently controlled on flecainide plus diltiazem and  Coumadin  . Vitamin D deficiency   . Weakness    numbness and tingling both hands    Past Surgical History:  Procedure Laterality Date  . ATRIAL ABLATION SURGERY  08/05/10   CTI ablation for atrial flutter by JA  . CARDIAC CATHETERIZATION  2012   nl LV function, no occlusive CAD, PAF  . CARDIOVERSION  12/07/2010    Successful direct current cardioversion with atrial fibrillation to normal sinus rhythm   . CARPAL TUNNEL RELEASE Bilateral 01/30/2014   Procedure: BILATERAL CARPAL TUNNEL RELEASE;  Surgeon: Marianna Payment, MD;  Location: [facility];  Service: Orthopedics;  Laterality: Bilateral;  . CATARACT EXTRACTION W/ INTRAOCULAR LENS  IMPLANT, BILATERAL Bilateral   . COLONOSCOPY [name] 12/02/2013   Procedure: COLONOSCOPY;  Surgeon: Irene Shipper, MD;  Location: [facility];  Service: Endoscopy;  Laterality: [name];  . ESOPHAGOGASTRODUODENOSCOPY [name] 09/22/2014   Procedure: ESOPHAGOGASTRODUODENOSCOPY (EGD);  Surgeon: Ronald Lobo, MD;  Location[facility] ENDOSCOPY;  Service: Endoscopy;  Laterality: [name];  . ESOPHAGOGASTRODUODENOSCOPY [name] 07/11/2016   Procedure: ESOPHAGOGASTRODUODENOSCOPY (EGD);  Surgeon: Doran Stabler, MD;  Location: [facility] ENDOSCOPY;  Service: Endoscopy;  Laterality: [name];  . INCISION AND DRAINAGE ABSCESS / HEMATOMA OF BURSA / KNEE / THIGH Left 1998   knee  . KNEE BURSECTOMY Left 1998  . LAPAROSCOPIC CHOLECYSTECTOMY  03/2010  . LEFT HEART CATH AND CORONARY ANGIOGRAPHY [name] 10/05/2017   Procedure: LEFT HEART CATH AND CORONARY ANGIOGRAPHY;  Surgeon: Martinique, Peter M, MD;  Location: [facility] CV LAB;  Service: Cardiovascular;  Laterality: [name];  . NM MYOVIEW LTD  07/22/2013   Normal EF ~60%, no ischemia or infarction.  [name] PARTIAL GASTRECTOMY  1982   subtotal; "took out 30% for ulcers"  . TRANSTHORACIC ECHOCARDIOGRAM  02/16/2014   EF 60%, no RWMA. - otherwise normal  . YAG LASER APPLICATION Bilateral    Family History:  Family History  Problem Relation Age of Onset  . Breast cancer Mother   . Diabetes Mother   . Kidney disease Mother   . Heart attack Father   . Heart disease Father   . Heart attack Brother   . Leukemia Daughter    Family Psychiatric  History: None per chart review.  Social History:  Social History   Substance and Sexual Activity  Alcohol Use No  . Alcohol/week: 0.0 standard drinks   Comment: "quit drinking in 1986"     Social History   Substance and Sexual  Activity  Drug Use No    Social History   Socioeconomic History  . Marital status: Widowed    Spouse name: Not on file  . Number of children: Not on file  . Years of education: Not on file  . Highest education level: Not on file  Occupational History  . Occupation: Retired  Scientific laboratory technician  . Financial resource strain: Not on file  . Food insecurity:    Worry: Not on file    Inability: Not on file  . Transportation needs:    Medical: Not on file    Non-medical: Not on file  Tobacco Use  . Smoking status: Current Every Day Smoker    Packs/day: 0.50    Years: 57.00    Pack years: 28.50    Types: Cigarettes  . Smokeless tobacco: Never Used  . Tobacco comment: He's been smoking between 0.5 and 2 ppd since age 18.  Substance and Sexual Activity  . Alcohol use: No    Alcohol/week: 0.0 standard drinks  Comment: "quit drinking in 1986"  . Drug use: No  . Sexual activity: Never  Lifestyle  . Physical activity:    Days per week: Not on file    Minutes per session: Not on file  . Stress: Not on file  Relationships  . Social connections:    Talks on phone: Not on file    Gets together: Not on file    Attends religious service: Not on file    Active member of club or organization: Not on file    Attends meetings of clubs or organizations: Not on file    Relationship status: Not on file  Other Topics Concern  . Not on file  Social History Narrative   He is a widower, who moved to [place] in 2011 to live closer to his daughter. He formerly lived in [place]. He is a father of 53, grandfather, and great-grandfather of 86. Does not drink alcohol, context of smoker.   Additional Social History: [name]    Allergies:   Allergies  Allergen Reactions  . Cyclobenzaprine Other (See Comments)    Unsteady gait    Labs:  Results for orders placed or performed during the hospital encounter of 04/14/18 (from the past 48 hour(s))  Comprehensive metabolic panel     Status:  Abnormal   Collection Time: 04/14/18  4:24 PM  Result Value Ref Range   Sodium 136 135 - 145 mmol/L   Potassium 3.0 (L) 3.5 - 5.1 mmol/L   Chloride 103 98 - 111 mmol/L   CO2 22 22 - 32 mmol/L   Glucose, Bld 203 (H) 70 - 99 mg/dL   BUN 15 8 - 23 mg/dL   Creatinine, Ser 1.08 0.61 - 1.24 mg/dL   Calcium 8.9 8.9 - 10.3 mg/dL   Total Protein 6.7 6.5 - 8.1 g/dL   Albumin 3.7 3.5 - 5.0 g/dL   AST 26 15 - 41 U/L   ALT 29 0 - 44 U/L   Alkaline Phosphatase 62 38 - 126 U/L   Total Bilirubin 0.4 0.3 - 1.2 mg/dL   GFR calc non Af Amer >60 >60 mL/min   GFR calc Af Amer >60 >60 mL/min   Anion gap 11 5 - 15    Comment: Performed at [facility], [place] [address] [place], [place] [address]  Ethanol     Status: None   Collection Time: 04/14/18  4:24 PM  Result Value Ref Range   Alcohol, Ethyl (B) <10 <10 mg/dL    Comment: (NOTE) Lowest detectable limit for serum alcohol is 10 mg/dL. For medical purposes only. Performed at [facility], [place] [address], [place], [place] [address]   Salicylate level     Status: None   Collection Time: 04/14/18  4:24 PM  Result Value Ref Range   Salicylate Lvl <7.4 2.8 - 30.0 mg/dL    Comment: Performed at [facility], [place] [address] [place], [place] [address]  Acetaminophen level     Status: Abnormal   Collection Time: 04/14/18  4:24 PM  Result Value Ref Range   Acetaminophen (Tylenol), Serum <10 (L) 10 - 30 ug/mL    Comment: (NOTE) Therapeutic concentrations vary significantly. A range of 10-30 ug/mL  may be an effective concentration for many patients. However, some  are best treated at concentrations outside of this range. Acetaminophen concentrations >150 ug/mL at 4 hours after ingestion  and >50 ug/mL at 12 hours after ingestion are often associated with  toxic reactions. Performed at [facility], [place] [address], [place], [place] [address]   Rapid urine drug screen  (hospital performed)     Status: None   Collection Time: 04/14/18  4:24 PM  Result Value Ref Range   Opiates NONE DETECTED NONE DETECTED   Cocaine NONE DETECTED NONE DETECTED   Benzodiazepines NONE DETECTED NONE DETECTED   Amphetamines NONE DETECTED NONE DETECTED   Tetrahydrocannabinol NONE DETECTED NONE DETECTED   Barbiturates NONE DETECTED NONE DETECTED    Comment: (NOTE) DRUG SCREEN FOR MEDICAL PURPOSES ONLY.  IF CONFIRMATION IS NEEDED FOR ANY PURPOSE, NOTIFY LAB WITHIN 5 DAYS. LOWEST DETECTABLE LIMITS FOR URINE DRUG SCREEN Drug Class                     Cutoff (ng/mL) Amphetamine and metabolites    1000 Barbiturate and metabolites    200 Benzodiazepine                 354 Tricyclics and metabolites     300 Opiates and metabolites        300 Cocaine and metabolites        300 THC                            50 Performed at [facility], [place] [address], [place], [place] [address]   CBC with Differential/Platelet     Status: Abnormal   Collection Time: 04/14/18  4:58 PM  Result Value Ref Range   WBC 7.2 4.0 - 10.5 K/uL   RBC 3.65 (L) 4.22 - 5.81 MIL/uL   Hemoglobin 10.5 (L) 13.0 - 17.0 g/dL   HCT 33.6 (L) 39.0 - 52.0 %   MCV 92.1 80.0 - 100.0 fL   MCH 28.8 26.0 - 34.0 pg   MCHC 31.3 30.0 - 36.0 g/dL   RDW 14.4 11.5 - 15.5 %   Platelets 340 150 - 400 K/uL   nRBC 0.0 0.0 - 0.2 %   Neutrophils Relative % 78 %   Neutro Abs 5.6 1.7 - 7.7 K/uL   Lymphocytes Relative 11 %   Lymphs Abs 0.8 0.7 - 4.0 K/uL   Monocytes Relative 9 %   Monocytes Absolute 0.7 0.1 - 1.0 K/uL   Eosinophils Relative 1 %   Eosinophils Absolute 0.1 0.0 - 0.5 K/uL   Basophils Relative 1 %   Basophils Absolute 0.1 0.0 - 0.1 K/uL   Immature Granulocytes 0 %   Abs Immature Granulocytes 0.02 0.00 - 0.07 K/uL    Comment: Performed at [facility], [place] [address], [place], [place] [address]  Urinalysis, Routine w reflex microscopic     Status: Abnormal   Collection  Time: 04/14/18  4:58 PM  Result Value Ref Range   Color, Urine YELLOW YELLOW   APPearance CLEAR CLEAR   Specific Gravity, Urine 1.014 1.005 - 1.030   pH 6.0 5.0 - 8.0   Glucose, UA NEGATIVE NEGATIVE mg/dL   Hgb urine dipstick NEGATIVE NEGATIVE   Bilirubin Urine NEGATIVE NEGATIVE   Ketones, ur 5 (A) NEGATIVE mg/dL   Protein, ur NEGATIVE NEGATIVE mg/dL   Nitrite NEGATIVE NEGATIVE   Leukocytes, UA NEGATIVE NEGATIVE    Comment: Performed at [place] [address], [place],  [address]    Current Facility-Administered Medications  Medication Dose Route Frequency Provider Last Rate Last Dose  . acetaminophen (TYLENOL) tablet 650 mg  650 mg Oral Q4H PRN Street, [place], PA-C      . albuterol (PROVENTIL HFA;VENTOLIN HFA) 108 (90 Base) MCG/ACT inhaler 2 puff  2 puff Inhalation Q4H PRN Street, [place], [place]      . alum & mag hydroxide-simeth (MAALOX/MYLANTA) 200-200-20 MG/5ML suspension 30 mL  30 mL Oral Q6H PRN Street, [place], [place]      . aspirin EC tablet 81 mg  81 mg Oral Daily [address], [place], [place]   81 mg at 04/14/18 1904  . clotrimazole-betamethasone (LOTRISONE) cream 1 application  1 application Topical BID Street, [place], [place]      . diclofenac sodium (VOLTAREN) 1 % transdermal gel 4 g  4 g Topical BID PRN Street, [place], [place]   4 g at 04/15/18 0457  . diltiazem (CARDIZEM CD) 24 hr capsule 300 mg  300 mg Oral Daily [address] [place], [place]      . ferrous sulfate tablet 325 mg  325 mg Oral Daily [address] [place], [place]   325 mg at 04/14/18 2107  . magnesium hydroxide (MILK OF MAGNESIA) suspension 5 mL  5 mL Oral Daily PRN Street, [place], [place]      . multivitamin with minerals tablet 1 tablet  1 tablet Oral BID Street, [place], [place]   1 tablet at 04/14/18 2110  . nitroGLYCERIN (NITROSTAT) SL tablet 0.4 mg  0.4 mg Sublingual Q5 Min x 3 PRN Street, [place], [place]      . ondansetron [facility]) tablet 4 mg  4 mg Oral Q8H PRN Street, [place], [place]      .  pantoprazole (PROTONIX) EC tablet 40 mg  40 mg Oral BID AC Street, [place], [place]   40 mg at 04/15/18 0263  . pravastatin (PRAVACHOL) tablet 40 mg  40 mg Oral QPM Street, [place], [place]   40 mg at 04/14/18 1905  . tamsulosin (FLOMAX) capsule 0.4 mg  0.4 mg Oral QPC breakfast Street, [place], [place]   0.4 mg at 04/15/18 7858   Current Outpatient Medications  Medication Sig Dispense Refill  . albuterol (PROVENTIL HFA;VENTOLIN HFA) 108 (90 Base) MCG/ACT inhaler Inhale 2 puffs into the lungs every 4 (four) hours as needed for wheezing or shortness of breath. 1 Inhaler 0  . aspirin EC 81 MG tablet Take 81 mg by mouth daily.    . clotrimazole-betamethasone (LOTRISONE) cream Apply 1 application topically 2 (two) times daily. 30 g 0  . co-enzyme Q-10 30 MG capsule Take 30 mg by mouth daily.     . diclofenac sodium (VOLTAREN) 1 % GEL Apply 4 g topically 3 (three) times daily as needed (for back pain). (Patient taking differently: Apply 4 g topically 2 (two) times daily as needed (for back pain). ) 1 Tube 0  . diltiazem (CARDIZEM CD) 300 MG 24 hr capsule Take 1 capsule (300 mg total) by mouth daily. 30 capsule 6  . diltiazem (CARDIZEM) 30 MG tablet TAKE 1 TABLET BY MOUTH 4 TIMES DAILY AS NEEDED FOR AFIB 30 tablet 11  . ferrous sulfate 325 (65 FE) MG tablet Take 1 tablet by mouth daily. Takes M-Fri    . hydrocortisone cream 1 % Apply to affected area 2 times daily 15 g 0  . magnesium hydroxide (DULCOLAX MILK OF MAGNESIA) 400 MG/5ML suspension Take 5 mLs by mouth daily as needed for mild constipation.    . Multiple Vitamin (MULTIVITAMIN WITH MINERALS) TABS tablet Take 1 tablet by mouth 2 (two) times daily.     . nitroGLYCERIN (NITROSTAT) 0.4 MG SL tablet Place 1 tablet (  0.4 mg total) under the tongue every 5 (five) minutes x 3 doses as needed for chest pain. 25 tablet 9  . pantoprazole (PROTONIX) 40 MG tablet Take 1 tablet (40 mg total) by mouth 2 (two) times daily before a meal.    . potassium chloride  (K-DUR) 10 MEQ tablet Take 2 tablets (20 mEq total) by mouth daily for 2 days. 4 tablet 0  . pravastatin (PRAVACHOL) 40 MG tablet Take 1 tablet (40 mg total) by mouth every evening. 90 tablet 3  . vitamin C (ASCORBIC ACID) 250 MG tablet Take 1 tablet (250 mg total) by mouth daily. (Patient taking differently: Take 250 mg by mouth 3 (three) times a week. ) 30 tablet 2  . ciprofloxacin (CIPRO) 500 MG tablet Take 1 tablet (500 mg total) by mouth 2 (two) times daily for 7 days. (Patient not taking: Reported on 04/14/2018) 14 tablet 0  . glucose blood (PRECISION QID TEST) test strip Check glucose twice daily    . Melatonin 3 MG TABS Take 1 tablet (3 mg total) by mouth at bedtime. (Patient not taking: Reported on 04/13/2018) 30 tablet 0  . nystatin cream (MYCOSTATIN) Apply to affected area 2 times daily (Patient not taking: Reported on 04/14/2018) 30 g 0    Musculoskeletal: Strength & Muscle Tone: Generalized weakness. Gait & Station: UTA since patient is lying in bed. Patient leans: [name]  Psychiatric Specialty Exam: Physical Exam  Nursing note and vitals reviewed. Constitutional: He is oriented to person, place, and time. He appears well-developed and well-nourished.  HENT:  Head: Normocephalic and atraumatic.  Neck: Normal range of motion.  Respiratory: Effort normal.  Musculoskeletal: Normal range of motion.  Neurological: He is alert and oriented to person, place, and time.  Psychiatric: His speech is normal and behavior is normal. Judgment and thought content normal. Cognition and memory are normal. He exhibits a depressed mood.    Review of Systems  Psychiatric/Behavioral: Positive for depression and suicidal ideas. Negative for substance abuse.  All other systems reviewed and are negative.   Blood pressure 113/61, pulse 83, temperature 97.7 F (36.5 C), temperature source Oral, resp. rate 16, SpO2 92 %.There is no height or weight on file to calculate BMI.  General Appearance: Fairly  Groomed, elderly, Caucasian male, wearing paper hospital scrubs who is lying in bed. NAD.   Eye Contact:  Good  Speech:  Clear and Coherent and Normal Rate  Volume:  Normal  Mood:  Depressed  Affect:  Congruent  Thought Process:  Goal Directed, Linear and Descriptions of Associations: Intact  Orientation:  Full (Time, Place, and Person)  Thought Content:  Logical  Suicidal Thoughts:  Yes.  with intent/plan  Homicidal Thoughts:  No  Memory:  Immediate;   Good Recent;   Good Remote;   Good  Judgement:  Fair  Insight:  Fair  Psychomotor Activity:  Normal  Concentration:  Concentration: Good and Attention Span: Good  Recall:  Good  Fund of Knowledge:  Good  Language:  Good  Akathisia:  No  Handed:  Right  AIMS (if indicated):   [name]  Assets:  Communication Skills Desire for Improvement Financial Resources/Insurance Housing Social Support  ADL's:  Intact  Cognition:  WNL  Sleep:   [name]   Assessment:  Kasey Ewings is a 78 y.o. male who was admitted with depression and SI in the setting of multiple medical problems. He is unable to safety plan. He warrants inpatient psychiatric hospitalization for stabilization and treatment.  Treatment Plan Summary: Daily contact with patient to assess and evaluate symptoms and progress in treatment and Medication management  -Start Lexapro 10 mg daily for depression.    Disposition: Recommend psychiatric Inpatient admission when medically cleared.  Faythe Dingwall, DO 04/15/2018 10:18 AM

## 2018-04-15 NOTE — ED Notes (Signed)
[facility] called for transportation.

## 2018-04-15 NOTE — Telephone Encounter (Signed)
New message       [name]  Daughter calling in and stated that pt is in [place] long and wanted Dr. Shonna Chock to know

## 2018-04-16 ENCOUNTER — Telehealth: Payer: Self-pay | Admitting: Cardiology

## 2018-04-16 ENCOUNTER — Ambulatory Visit: Payer: Medicare Other | Admitting: Cardiology

## 2018-04-16 DIAGNOSIS — R338 Other retention of urine: Secondary | ICD-10-CM | POA: Diagnosis not present

## 2018-04-16 DIAGNOSIS — R339 Retention of urine, unspecified: Secondary | ICD-10-CM | POA: Diagnosis not present

## 2018-04-16 DIAGNOSIS — J449 Chronic obstructive pulmonary disease, unspecified: Secondary | ICD-10-CM | POA: Diagnosis not present

## 2018-04-16 DIAGNOSIS — Z87891 Personal history of nicotine dependence: Secondary | ICD-10-CM | POA: Diagnosis not present

## 2018-04-16 DIAGNOSIS — I10 Essential (primary) hypertension: Secondary | ICD-10-CM | POA: Diagnosis not present

## 2018-04-16 DIAGNOSIS — E7800 Pure hypercholesterolemia, unspecified: Secondary | ICD-10-CM | POA: Diagnosis not present

## 2018-04-16 DIAGNOSIS — E119 Type 2 diabetes mellitus without complications: Secondary | ICD-10-CM | POA: Diagnosis not present

## 2018-04-16 NOTE — Telephone Encounter (Signed)
Dr. Curt Bears spoke with Dr. Delma Freeze

## 2018-04-16 NOTE — Telephone Encounter (Signed)
New Message    Dr. Delma Freeze calling to get patient's medication list.

## 2018-04-17 ENCOUNTER — Ambulatory Visit: Payer: Medicare Other | Admitting: Family Medicine

## 2018-04-25 ENCOUNTER — Telehealth: Payer: Self-pay | Admitting: Family Medicine

## 2018-04-25 DIAGNOSIS — N390 Urinary tract infection, site not specified: Secondary | ICD-10-CM | POA: Diagnosis not present

## 2018-04-25 DIAGNOSIS — K529 Noninfective gastroenteritis and colitis, unspecified: Secondary | ICD-10-CM | POA: Diagnosis not present

## 2018-04-25 DIAGNOSIS — T83098A Other mechanical complication of other indwelling urethral catheter, initial encounter: Secondary | ICD-10-CM | POA: Diagnosis not present

## 2018-04-25 DIAGNOSIS — B3742 Candidal balanitis: Secondary | ICD-10-CM | POA: Diagnosis not present

## 2018-04-25 NOTE — Telephone Encounter (Signed)
Patient daughter called stating that she would like a call back from the nurse, states patient can longer take care of him self and is having suicidal thoughts, patient is hospitalized in [place] right now and daughter would like a referral so the patient would have some where to go when he gets out. Please follow up.

## 2018-04-25 NOTE — Telephone Encounter (Signed)
Patient has only been seen in office here once for a matter non-related to mental health. He is currently in an impatient facility. Family should coordinate with Education officer, museum and psychiatrist at facility to determine long-term placement. It is the patient's family responsibility to locate placement and PCP normally completes the FL-2 form stating the reason patient requires assisted living. As patient was only seen once, he will require an office prior to any documentation being completed.

## 2018-04-25 NOTE — Telephone Encounter (Signed)
Please advise 

## 2018-04-28 ENCOUNTER — Emergency Department (HOSPITAL_COMMUNITY): Payer: Medicare Other

## 2018-04-28 ENCOUNTER — Other Ambulatory Visit: Payer: Self-pay

## 2018-04-28 ENCOUNTER — Encounter (HOSPITAL_COMMUNITY): Payer: Self-pay | Admitting: Emergency Medicine

## 2018-04-28 ENCOUNTER — Emergency Department (HOSPITAL_COMMUNITY)
Admission: EM | Admit: 2018-04-28 | Discharge: 2018-04-29 | Disposition: A | Discharge status: Home / Self Care | Payer: Medicare Other | Attending: Emergency Medicine | Admitting: Emergency Medicine

## 2018-04-28 DIAGNOSIS — F332 Major depressive disorder, recurrent severe without psychotic features: Secondary | ICD-10-CM | POA: Insufficient documentation

## 2018-04-28 DIAGNOSIS — E119 Type 2 diabetes mellitus without complications: Secondary | ICD-10-CM | POA: Diagnosis not present

## 2018-04-28 DIAGNOSIS — F17210 Nicotine dependence, cigarettes, uncomplicated: Secondary | ICD-10-CM | POA: Diagnosis not present

## 2018-04-28 DIAGNOSIS — I2510 Atherosclerotic heart disease of native coronary artery without angina pectoris: Secondary | ICD-10-CM | POA: Insufficient documentation

## 2018-04-28 DIAGNOSIS — Z79899 Other long term (current) drug therapy: Secondary | ICD-10-CM | POA: Insufficient documentation

## 2018-04-28 DIAGNOSIS — R45851 Suicidal ideations: Secondary | ICD-10-CM | POA: Diagnosis not present

## 2018-04-28 DIAGNOSIS — J449 Chronic obstructive pulmonary disease, unspecified: Secondary | ICD-10-CM | POA: Insufficient documentation

## 2018-04-28 DIAGNOSIS — I5032 Chronic diastolic (congestive) heart failure: Secondary | ICD-10-CM | POA: Diagnosis not present

## 2018-04-28 DIAGNOSIS — R319 Hematuria, unspecified: Secondary | ICD-10-CM | POA: Diagnosis not present

## 2018-04-28 DIAGNOSIS — I110 Hypertensive heart disease with heart failure: Secondary | ICD-10-CM | POA: Insufficient documentation

## 2018-04-28 DIAGNOSIS — Z7982 Long term (current) use of aspirin: Secondary | ICD-10-CM | POA: Insufficient documentation

## 2018-04-28 DIAGNOSIS — N390 Urinary tract infection, site not specified: Secondary | ICD-10-CM | POA: Diagnosis not present

## 2018-04-28 DIAGNOSIS — R531 Weakness: Secondary | ICD-10-CM | POA: Diagnosis not present

## 2018-04-28 DIAGNOSIS — R05 Cough: Secondary | ICD-10-CM | POA: Diagnosis not present

## 2018-04-28 DIAGNOSIS — N3091 Cystitis, unspecified with hematuria: Secondary | ICD-10-CM | POA: Diagnosis not present

## 2018-04-28 LAB — COMPREHENSIVE METABOLIC PANEL
ALT: 24 U/L (ref 0–44)
AST: 27 U/L (ref 15–41)
Albumin: 2.8 g/dL — ABNORMAL LOW (ref 3.5–5.0)
Alkaline Phosphatase: 56 U/L (ref 38–126)
Anion gap: 12 (ref 5–15)
BUN: 9 mg/dL (ref 8–23)
CO2: 21 mmol/L — ABNORMAL LOW (ref 22–32)
Calcium: 8.4 mg/dL — ABNORMAL LOW (ref 8.9–10.3)
Chloride: 103 mmol/L (ref 98–111)
Creatinine, Ser: 0.92 mg/dL (ref 0.61–1.24)
GFR calc Af Amer: >60 mL/min (ref >60)
GFR calc non Af Amer: >60 mL/min (ref >60)
Glucose, Bld: 244 mg/dL — ABNORMAL HIGH (ref 70–99)
Potassium: 3.9 mmol/L (ref 3.5–5.1)
Sodium: 136 mmol/L (ref 135–145)
Total Bilirubin: 0.5 mg/dL (ref 0.3–1.2)
Total Protein: 5.6 g/dL — ABNORMAL LOW (ref 6.5–8.1)

## 2018-04-28 LAB — URINALYSIS, ROUTINE W REFLEX MICROSCOPIC
Bilirubin Urine: NEGATIVE
Glucose, UA: NEGATIVE mg/dL
Ketones, ur: NEGATIVE mg/dL
Nitrite: NEGATIVE
Specific Gravity, Urine: 1.02 (ref 1.005–1.030)
pH: 8.5 — ABNORMAL HIGH (ref 5.0–8.0)

## 2018-04-28 LAB — CBC WITH DIFFERENTIAL/PLATELET
Abs Immature Granulocytes: 0.02 10*3/uL (ref 0.00–0.07)
Basophils Absolute: 0 10*3/uL (ref 0.0–0.1)
Basophils Relative: 1 %
EOS ABS: 0.1 10*3/uL (ref 0.0–0.5)
Eosinophils Relative: 2 %
HCT: 34.1 % — ABNORMAL LOW (ref 39.0–52.0)
Hemoglobin: 10.4 g/dL — ABNORMAL LOW (ref 13.0–17.0)
Immature Granulocytes: 0 %
Lymphocytes Relative: 9 %
Lymphs Abs: 0.4 10*3/uL — ABNORMAL LOW (ref 0.7–4.0)
MCH: 28.2 pg (ref 26.0–34.0)
MCHC: 30.5 g/dL (ref 30.0–36.0)
MCV: 92.4 fL (ref 80.0–100.0)
Monocytes Absolute: 0.4 10*3/uL (ref 0.1–1.0)
Monocytes Relative: 8 %
Neutro Abs: 3.6 10*3/uL (ref 1.7–7.7)
Neutrophils Relative %: 80 %
Platelets: 328 10*3/uL (ref 150–400)
RBC: 3.69 MIL/uL — ABNORMAL LOW (ref 4.22–5.81)
RDW: 15.3 % (ref 11.5–15.5)
WBC: 4.5 10*3/uL (ref 4.0–10.5)
nRBC: 0 % (ref 0.0–0.2)

## 2018-04-28 LAB — RAPID URINE DRUG SCREEN, HOSP PERFORMED
Amphetamines: NOT DETECTED
Barbiturates: NOT DETECTED
Benzodiazepines: NOT DETECTED
Cocaine: NOT DETECTED
Opiates: NOT DETECTED
TETRAHYDROCANNABINOL: NOT DETECTED

## 2018-04-28 LAB — URINALYSIS, MICROSCOPIC (REFLEX)
Squamous Epithelial / HPF: NONE SEEN (ref 0–5)
WBC, UA: NONE SEEN WBC/hpf (ref 0–5)

## 2018-04-28 LAB — ETHANOL: Alcohol, Ethyl (B): <10 mg/dL (ref <10)

## 2018-04-28 MED ORDER — DILTIAZEM HCL ER COATED BEADS 300 MG PO CP24
300.0000 mg | ORAL_CAPSULE | Freq: Every day | ORAL | Status: DC
  Administered 2018-04-29: 300 mg via ORAL
  Filled 2018-04-28 (×2): qty 1

## 2018-04-28 MED ORDER — CEPHALEXIN 250 MG PO CAPS
500.0000 mg | ORAL_CAPSULE | Freq: Two times a day (BID) | ORAL | Status: DC
  Administered 2018-04-28 (×2): 500 mg via ORAL
  Filled 2018-04-28 (×3): qty 2

## 2018-04-28 MED ORDER — PANTOPRAZOLE SODIUM 40 MG PO TBEC
40.0000 mg | DELAYED_RELEASE_TABLET | Freq: Two times a day (BID) | ORAL | Status: DC
  Administered 2018-04-28 – 2018-04-29 (×2): 40 mg via ORAL
  Filled 2018-04-28 (×2): qty 1

## 2018-04-28 MED ORDER — ASPIRIN EC 81 MG PO TBEC
81.0000 mg | DELAYED_RELEASE_TABLET | Freq: Every day | ORAL | Status: DC
  Administered 2018-04-28 – 2018-04-29 (×2): 81 mg via ORAL
  Filled 2018-04-28 (×2): qty 1

## 2018-04-28 MED ORDER — PRAVASTATIN SODIUM 40 MG PO TABS
40.0000 mg | ORAL_TABLET | Freq: Every evening | ORAL | Status: DC
  Administered 2018-04-28: 40 mg via ORAL
  Filled 2018-04-28: qty 1

## 2018-04-28 MED ORDER — ALBUTEROL SULFATE HFA 108 (90 BASE) MCG/ACT IN AERS: 2.0000 | INHALATION_SPRAY | RESPIRATORY_TRACT | Status: DC | PRN

## 2018-04-28 NOTE — ED Notes (Signed)
Pt ambulated to bathroom w/assistance d/t stated he felt he needed to have BM - No results - RN assisted pt back to room.

## 2018-04-28 NOTE — Progress Notes (Addendum)
3:00PM: CSW received call back from patient's daughter. Patient's daughter reported she was frustrated as the patient required constant support and would be upset when she was not home. CSW notes patient reported SI to his daughter last night which prompted her taking her father to the hospital. CSW noted patient's daughter reported she was open to him returning to her home if he is medically and psychiatrically cleared. CSW notes pending consult. CSW will follow to see results of evaluation. CSW provided patient's daughter with contact information for DSS to follow up with assisted living placement and support.  2:49PM: CSW left voicemail with patient's daughter at [phone]. CSW continuing to follow for family supports.   CSW received consult for patient placement needs. CSW met with patient and noted he had recently been discharged from [facility] to his daughter's home. Per patient daughter dropped him off at ED and said she can not take care of him anymore. Patient reports he wants to find a long-term placement and reported he has difficulty with maintaining his ADLs (identified cooking, bathing, changing clothes, toileting). CSW discussed the option of assisted living facility and discussed reaching out and pursuing it in the community. CSW notes patient was not receptive and stated he can not go back to his daughter's home under any conditions. CSW will continue to follow up for medical disposition to determine course of action for placement options.   Lamonte Richer, LCSW, [place] Worker II [phone]

## 2018-04-28 NOTE — ED Notes (Signed)
Pt calling his daughter to advise of tx plan - psych cleared - requesting for Assisted Living placement.

## 2018-04-28 NOTE — ED Notes (Signed)
RN has assisted pt to the bathroom x 4 times for bowel movements-Monique,RN

## 2018-04-28 NOTE — BH Assessment (Signed)
Tele Assessment Note   Patient Name: [name] MRN: [id number] Referring Physician: Lennice Sites, DO Location of Patient: [facility] Location of Provider: [place] is a 78 y.o. male who presented to [facility] (via daughter's vehicle) with complaint of suicidal ideation, despondency, and other symptoms.  Pt lives with his daughter and her husband in [place], and he receives disability.  Pt was last assessed by TTS on 04/14/2018.  At that time, Pt presented with complaint of suicidal ideation and other symptoms.  Pt was treated inpatient at [facility] but ''they didn't do anything.''  Pt requested treatment for depressive symptoms, anxiety, and also declining physical health (per report, Pt cannot bathe, groom, or change clothing independently).    Pt reported that although he was treated at [facility], he continues to feel suicidal with plan to overdose.  In addition to suicidal ideation with plan, Pt endorsed despondency, poor appetite (having lost 22 pounds over last 3.5 months), low energy, confusion (especially at evening), vegetative disturbance.  Pt also endorsed self-injury (slapping self).  Pt also reported declining health over the last three years, with difficulty bathing, grooming, dressing, and toileting.  Pt denied homicidal ideation and substance use concerns.    Pt presented as alert and oriented.  He had good eye contact and was cooperative.  Pt was gowned.  Mood was reported as sad.  Affect was preoccupied with placement.  Pt reported suicidal ideation and other symptoms.  Pt's speech was normal in rate, rhythm, and volume.  Thought processes were within normal range, and thought content was logical and goal-oriented.  There was no evidence of delusion.  Pt's memory and concentration were fair.  Insight, judgment, and impulse control were fair.  Consulted with T. Money, NP who also spoke with Pt.  T. Money determined that Pt is to be  psych-cleared -- Pt's endorsement of suicidal ideation is conditional on treatment at home -- when daughter is available and helping, he does not feel suicidal.  Social work to call for PT and SW consult.  Diagnosis: Major Depressive Disorder, Recurrent, Severe w/o psychotic features  Past Medical History:  Past Medical History:  Diagnosis Date  . Allergic rhinitis   . Anemia    takes Ferrous Sulfate daily  . Arthritis    "all over"  . Atrial flutter ([place])   . Balance problem 01/2014  . CAP (community acquired pneumonia) 09/18/2014  . Cervical radiculopathy due to degenerative joint disease of spine   . Chronic coronary artery disease    a. 03/2010 Nonocclusive disease by cath, performed for ST elevations on ECG;  b. 06/2013 Lexi MV: EF 60%, no ischemia.  [name] COPD (chronic obstructive pulmonary disease) ([place])   . Diabetes mellitus type II    takes Metformin and Lantus daily  . Diastolic CHF, chronic ([place])    a. 12/2012 EF 55-60%, diast dysfxn, triv MR, mildly dil LA/RA.  [name] Essential hypertension    takes Diltiazem daily  . Frail elderly   . GERD (gastroesophageal reflux disease)   . History of blood transfusion 1982   "when I had stomach OR"  . History of bronchitis    1998  . History of CVA (cerebrovascular accident)   . History of gastric ulcer   . Hypercholesterolemia    takes Pravastatin daily  . Intrinsic eczema   . Legally blind in left eye, as defined in [place]   . Obesity   . PAF (paroxysmal atrial fibrillation) ([place])  Recurrent after atrial flutter (a. 07/2010 Status post caval tricuspid isthmus ablation by Dr. Midge Aver Metoprolol daily), currently controlled on flecainide plus diltiazem and Coumadin  . Vitamin D deficiency   . Weakness    numbness and tingling both hands    Past Surgical History:  Procedure Laterality Date  . ATRIAL ABLATION SURGERY  08/05/10   CTI ablation for atrial flutter by JA  . CARDIAC CATHETERIZATION  2012   nl LV function, no occlusive  CAD, PAF  . CARDIOVERSION  12/07/2010    Successful direct current cardioversion with atrial fibrillation to normal sinus rhythm  . CARPAL TUNNEL RELEASE Bilateral 01/30/2014   Procedure: BILATERAL CARPAL TUNNEL RELEASE;  Surgeon: Marianna Payment, MD;  Location: [facility];  Service: Orthopedics;  Laterality: Bilateral;  . CATARACT EXTRACTION W/ INTRAOCULAR LENS  IMPLANT, BILATERAL Bilateral   . COLONOSCOPY [name] 12/02/2013   Procedure: COLONOSCOPY;  Surgeon: Irene Shipper, MD;  Location: [facility];  Service: Endoscopy;  Laterality: [name];  . ESOPHAGOGASTRODUODENOSCOPY [name] 09/22/2014   Procedure: ESOPHAGOGASTRODUODENOSCOPY (EGD);  Surgeon: Ronald Lobo, MD;  Location: [facility] ENDOSCOPY;  Service: Endoscopy;  Laterality: [name];  . ESOPHAGOGASTRODUODENOSCOPY [name] 07/11/2016   Procedure: ESOPHAGOGASTRODUODENOSCOPY (EGD);  Surgeon: Doran Stabler, MD;  Location: [facility] ENDOSCOPY;  Service: Endoscopy;  Laterality: [name];  . INCISION AND DRAINAGE ABSCESS / HEMATOMA OF BURSA / KNEE / THIGH Left 1998   knee  . KNEE BURSECTOMY Left 1998  . LAPAROSCOPIC CHOLECYSTECTOMY  03/2010  . LEFT HEART CATH AND CORONARY ANGIOGRAPHY [name] 10/05/2017   Procedure: LEFT HEART CATH AND CORONARY ANGIOGRAPHY;  Surgeon: Martinique, Peter M, MD;  Location: [place] CV LAB;  Service: Cardiovascular;  Laterality: [name];  . NM MYOVIEW LTD  07/22/2013   Normal EF ~60%, no ischemia or infarction.  [name] PARTIAL GASTRECTOMY  1982   subtotal; "took out 30% for ulcers"  . TRANSTHORACIC ECHOCARDIOGRAM  02/16/2014   EF 60%, no RWMA. - otherwise normal  . YAG LASER APPLICATION Bilateral     Family History:  Family History  Problem Relation Age of Onset  . Breast cancer Mother   . Diabetes Mother   . Kidney disease Mother   . Heart attack Father   . Heart disease Father   . Heart attack Brother   . Leukemia Daughter     Social History:  reports that he has been smoking cigarettes. He has a 28.50 pack-year smoking history. He has never used  smokeless tobacco. He reports that he does not drink alcohol or use drugs.  Additional Social History:  Alcohol / Drug Use Pain Medications: See MAR Prescriptions: See MAR Over the Counter: See MAR History of alcohol / drug use?: No history of alcohol / drug abuse  CIWA: CIWA-Ar BP: 124/88 Pulse Rate: (!) 109 COWS:    Allergies:  Allergies  Allergen Reactions  . Cyclobenzaprine Other (See Comments)    Unsteady gait    Home Medications: (Not in a hospital admission)   OB/GYN Status:  No LMP for male patient.  General Assessment Data Assessment unable to be completed: Yes Reason for not completing assessment: Room not available Location of Assessment: [facility] ED TTS Assessment: In system Is this a Tele or Face-to-Face Assessment?: Tele Assessment Is this an Initial Assessment or a Re-assessment for this encounter?: Initial Assessment Patient Accompanied by:: [name] Language Other than English: No Living Arrangements: Other (Comment)(Daughter and her hsuband) What gender do you identify as?: Male Marital status: Widowed Pregnancy Status: No Living Arrangements: Children, Other (  Comment) Can pt return to current living arrangement?: Yes Admission Status: Voluntary Is patient capable of signing voluntary admission?: Yes Referral Source: Self/Family/Friend Insurance type: [facility]     Crisis Care Plan Living Arrangements: Children, Other (Comment) Legal Guardian: Other:(None) Name of Psychiatrist: None Name of Therapist: None  Education Status Is patient currently in school?: No Is the patient employed, unemployed or receiving disability?: Receiving disability income  Risk to self with the past 6 months Suicidal Ideation: Yes-Currently Present Has patient been a risk to self within the past 6 months prior to admission? : Yes Suicidal Intent: No-Not Currently/Within Last 6 Months Has patient had any suicidal intent within the past 6 months prior to admission? : Yes Is  patient at risk for suicide?: Yes Suicidal Plan?: Yes-Currently Present Has patient had any suicidal plan within the past 6 months prior to admission? : Yes Specify Current Suicidal Plan: Overdose on pills Access to Means: Yes Specify Access to Suicidal Means: Prescribred meds What has been your use of drugs/alcohol within the last 12 months?: Pt denied; UDS negative Previous Attempts/Gestures: No Triggers for Past Attempts: None known Intentional Self Injurious Behavior: Damaging Comment - Self Injurious Behavior: Pt stated that he slaps himself when upset Family Suicide History: No Recent stressful life event(s): Conflict (Comment), Recent negative physical changes(Blood in urine; declining ability to self-care; conflict w/d) Persecutory voices/beliefs?: No Depression: Yes Depression Symptoms: Despondent, Fatigue, Isolating, Loss of interest in usual pleasures, Feeling worthless/self pity Substance abuse history and/or treatment for substance abuse?: No Suicide prevention information given to non-admitted patients: Not applicable  Risk to Others within the past 6 months Homicidal Ideation: No Does patient have any lifetime risk of violence toward others beyond the six months prior to admission? : No Thoughts of Harm to Others: No Current Homicidal Intent: No Current Homicidal Plan: No Access to Homicidal Means: No History of harm to others?: No Assessment of Violence: None Noted Does patient have access to weapons?: No Criminal Charges Pending?: No Does patient have a court date: No Is patient on probation?: No  Psychosis Hallucinations: None noted Delusions: None noted  Mental Status Report Appearance/Hygiene: In hospital gown, Unremarkable Eye Contact: Poor Motor Activity: Freedom of movement Speech: Logical/coherent Level of Consciousness: Alert Mood: Depressed, Helpless Affect: Preoccupied Anxiety Level: None Thought Processes: Coherent, Relevant Judgement:  Partial Orientation: Person, Place, Time, Situation Obsessive Compulsive Thoughts/Behaviors: None  Cognitive Functioning Concentration: Normal Memory: Recent Intact, Remote Intact Is patient IDD: No Insight: Fair Impulse Control: Fair Appetite: Poor Have you had any weight changes? : Loss Amount of the weight change? (lbs): 22 lbs Sleep: Decreased Total Hours of Sleep: (Mixed) Vegetative Symptoms: Not bathing, Decreased grooming(Difficulty with self-care (bathing, grooming, dressing))  ADLScreening [facility] Assessment Services) Patient's cognitive ability adequate to safely complete daily activities?: Yes Patient able to express need for assistance with ADLs?: Yes Independently performs ADLs?: No  Prior Inpatient Therapy Prior Inpatient Therapy: Yes Prior Therapy Dates: January 2020 Prior Therapy Facilty/Provider(s): [facility] Reason for Treatment: Suicidal ideation  Prior Outpatient Therapy Prior Outpatient Therapy: No Does patient have an ACCT team?: No Does patient have Intensive In-House Services?  : No Does patient have [facility] services? : No Does patient have P4CC services?: No  ADL Screening (condition at time of admission) Patient's cognitive ability adequate to safely complete daily activities?: Yes Is the patient deaf or have difficulty hearing?: No Does the patient have difficulty seeing, even when wearing glasses/contacts?: No Does the patient have difficulty concentrating, remembering, or  making decisions?: Yes Patient able to express need for assistance with ADLs?: Yes Does the patient have difficulty dressing or bathing?: Yes Independently performs ADLs?: No Dressing (OT): Needs assistance Grooming: Needs assistance Bathing: Needs assistance Walks in Home: Needs assistance Does the patient have difficulty walking or climbing stairs?: Yes  Home Assistive Devices/Equipment Home Assistive Devices/Equipment: Cane (specify quad or straight)  Therapy Consults  (therapy consults require a physician order) PT Evaluation Needed: No OT Evalulation Needed: No SLP Evaluation Needed: No Abuse/Neglect Assessment (Assessment to be complete while patient is alone) Physical Abuse: Denies Verbal Abuse: Denies Sexual Abuse: Denies Exploitation of patient/patient's resources: Denies Self-Neglect: Denies Values / Beliefs Cultural Requests During Hospitalization: None Spiritual Requests During Hospitalization: None Consults Spiritual Care Consult Needed: No Social Work Consult Needed: No Regulatory affairs officer (For Healthcare) Does Patient Have a Medical Advance Directive?: No Would patient like information on creating a medical advance directive?: No - Patient declined          Disposition:  Disposition Initial Assessment Completed for this Encounter: Yes  This service was provided via telemedicine using a 2-way, interactive audio and video technology.  Names of all persons participating in this telemedicine service and their role in this encounter. Name: Arentz,Saman Role: Pt             Marlowe Aschoff 04/28/2018 5:05 PM

## 2018-04-28 NOTE — Progress Notes (Signed)
Patient is seen by me via tele-psych and I have consulted with Dr. Dwyane Dee.  Patient denies any homicidal ideations and denies any hallucinations.  Patient reports that he has suicidal thoughts with no intent or plan but states that his suicidal thoughts comes from his living arrangements and the inability to care for himself.  He reports not been able to bathe, difficulty with ambulation, difficulty with ambulation to the kitchen to get food.  He reports that he lives with his daughter and she is not there sometimes and is unable to take care of himself when she is gone.  He reports that he was unable to eat for approximately 3 days because of been unable to get to the kitchen to get his food.  Patient states that if he can get the help he needed that he is not suicidal and is tired of living in the situation that he is in.  Discussed patient and treatment options for patient with Dr. Sedonia Small and we decided to have patient assessed by social work, case management, and physical therapy and to potentially place patient into a assisted living facility.  At this time patient does not meet inpatient criteria and is psychiatrically cleared.

## 2018-04-28 NOTE — ED Notes (Addendum)
Pt noted to be wearing burgundy scrubs. F/C intact - draining pink colored urine. States he may not be SI if he is placed in a SNF facility where he can be administered his meds and has his meals brought to him. States his daughter leaves him by himself x 11 hours daily and he feels he needs someone to be w/him. Offered HH - states no d/t "they are not with me all of the time". States he has nightmares at night which then cause him to feel SI. States he is unable to sleep at night.

## 2018-04-28 NOTE — Discharge Instructions (Addendum)
Please read attached information. If you experience any new or worsening signs or symptoms please return to the emergency room for evaluation. Please follow-up with your primary care provider or specialist as discussed. Please use medication prescribed only as directed and discontinue taking if you have any concerning signs or symptoms.   °

## 2018-04-28 NOTE — ED Notes (Signed)
ALL belongings - 2 labeled belongings bags - inventoried - placed in [place] [address]. NO Valuables noted.

## 2018-04-28 NOTE — ED Provider Notes (Signed)
[facility] EMERGENCY DEPARTMENT Provider Note   CSN: [id number] Arrival date & time: 04/28/18  1024     History   Chief Complaint Chief Complaint  Patient presents with  . Psychiatric Evaluation    HPI [name] is a 78 y.o. male.  The history is provided by the patient.  Mental Health Problem  Presenting symptoms: suicidal thoughts and suicidal threats   Degree of incapacity (severity):  Mild Onset quality:  Gradual Timing:  Intermittent Progression:  Waxing and waning Chronicity:  Recurrent Context: not noncompliant   Treatment compliance:  All of the time Worsened by:  Family interactions Associated symptoms: feelings of worthlessness, irritability and poor judgment   Associated symptoms: no abdominal pain and no chest pain   Risk factors: hx of mental illness     Past Medical History:  Diagnosis Date  . Allergic rhinitis   . Anemia    takes Ferrous Sulfate daily  . Arthritis    "all over"  . Atrial flutter ([place])   . Balance problem 01/2014  . CAP (community acquired pneumonia) 09/18/2014  . Cervical radiculopathy due to degenerative joint disease of spine   . Chronic coronary artery disease    a. 03/2010 Nonocclusive disease by cath, performed for ST elevations on ECG;  b. 06/2013 Lexi MV: EF 60%, no ischemia.  [name] COPD (chronic obstructive pulmonary disease) ([place])   . Diabetes mellitus type II    takes Metformin and Lantus daily  . Diastolic CHF, chronic ([place])    a. 12/2012 EF 55-60%, diast dysfxn, triv MR, mildly dil LA/RA.  [name] Essential hypertension    takes Diltiazem daily  . Frail elderly   . GERD (gastroesophageal reflux disease)   . History of blood transfusion 1982   "when I had stomach OR"  . History of bronchitis    1998  . History of CVA (cerebrovascular accident)   . History of gastric ulcer   . Hypercholesterolemia    takes Pravastatin daily  . Intrinsic eczema   . Legally blind in left eye, as defined in [place]     . Obesity   . PAF (paroxysmal atrial fibrillation) (H[facility])    Recurrent after atrial flutter (a. 07/2010 Status post caval tricuspid isthmus ablation by Dr. Midge Aver Metoprolol daily), currently controlled on flecainide plus diltiazem and Coumadin  . Vitamin D deficiency   . Weakness    numbness and tingling both hands    Patient Active Problem List   Diagnosis Date Noted  . Major depressive disorder, recurrent severe without psychotic features ([place]) 04/15/2018  . Depression   . Rapid atrial fibrillation ([place]) 01/18/2018  . Atrial fibrillation with RVR ([place]) 11/08/2017  . Chest pain with moderate risk of acute coronary syndrome 10/04/2017  . HTN (hypertension) 05/21/2017  . Diabetes mellitus type II 05/21/2017  . Diastolic CHF, chronic ([place]) 05/21/2017  . COPD (chronic obstructive pulmonary disease) ([place]) 05/21/2017  . H/O atrial flutter 05/21/2017  . Coronary disease/nonobstructive 05/21/2017  . Acute blood loss anemia 10/02/2016  . Abdominal pain 10/02/2016  . Constipation 10/02/2016  . Left lower quadrant pain   . Coagulopathy ([place])   . Heme positive stool   . Symptomatic anemia 07/10/2016  . Syncope 05/15/2015  . Cervical disc disease 05/15/2015  . Abnormal urinalysis 05/15/2015  . Anemia 05/15/2015  . History of CVA (cerebrovascular accident) 04/03/2014  . Near syncope 02/15/2014  . Pre-syncope 02/15/2014  . Benign neoplasm of rectum and anal canal 12/02/2013  .  Upper GI bleeding 11/30/2013  . Tobacco use disorder 11/30/2013  . Anticoagulation goal of INR 2 to 3, for PAF - CHA2DS2Vasc = 7 08/15/2013  . Type 2 diabetes mellitus ([place]) 01/22/2013  . Tobacco abuse counseling 12/15/2012  . Hyperlipidemia   . Obesity (BMI 30-39.9) 12/14/2012  . Persistent atrial fibrillation ([place]) 10/07/2010  . Atrial flutter - status post CTI ablation 07/29/2010  . Essential hypertension 07/29/2010  . Chronic diastolic congestive heart failure ([place]) 07/29/2010  . Coronary artery  disease, non-occlusive 07/29/2010    Past Surgical History:  Procedure Laterality Date  . ATRIAL ABLATION SURGERY  08/05/10   CTI ablation for atrial flutter by JA  . CARDIAC CATHETERIZATION  2012   nl LV function, no occlusive CAD, PAF  . CARDIOVERSION  12/07/2010    Successful direct current cardioversion with atrial fibrillation to normal sinus rhythm  . CARPAL TUNNEL RELEASE Bilateral 01/30/2014   Procedure: BILATERAL CARPAL TUNNEL RELEASE;  Surgeon: Marianna Payment, MD;  Location: [facility];  Service: Orthopedics;  Laterality: Bilateral;  . CATARACT EXTRACTION W/ INTRAOCULAR LENS  IMPLANT, BILATERAL Bilateral   . COLONOSCOPY [name] 12/02/2013   Procedure: COLONOSCOPY;  Surgeon: Irene Shipper, MD;  Location: [facility];  Service: Endoscopy;  Laterality: [name];  . ESOPHAGOGASTRODUODENOSCOPY [name] 09/22/2014   Procedure: ESOPHAGOGASTRODUODENOSCOPY (EGD);  Surgeon: Ronald Lobo, MD;  Location[facility] ENDOSCOPY;  Service: Endoscopy;  Laterality: [name];  . ESOPHAGOGASTRODUODENOSCOPY [name] 07/11/2016   Procedure: ESOPHAGOGASTRODUODENOSCOPY (EGD);  Surgeon: Doran Stabler, MD;  Location: [facility] ENDOSCOPY;  Service: Endoscopy;  Laterality: [name];  . INCISION AND DRAINAGE ABSCESS / HEMATOMA OF BURSA / KNEE / THIGH Left 1998   knee  . KNEE BURSECTOMY Left 1998  . LAPAROSCOPIC CHOLECYSTECTOMY  03/2010  . LEFT HEART CATH AND CORONARY ANGIOGRAPHY [name] 10/05/2017   Procedure: LEFT HEART CATH AND CORONARY ANGIOGRAPHY;  Surgeon: Martinique, Peter M, MD;  Location: [facility] CV LAB;  Service: Cardiovascular;  Laterality: [name];  . NM MYOVIEW LTD  07/22/2013   Normal EF ~60%, no ischemia or infarction.  [name] PARTIAL GASTRECTOMY  1982   subtotal; "took out 30% for ulcers"  . TRANSTHORACIC ECHOCARDIOGRAM  02/16/2014   EF 60%, no RWMA. - otherwise normal  . YAG LASER APPLICATION Bilateral         Home Medications    Prior to Admission medications   Medication Sig Start Date End Date Taking? Authorizing Provider  albuterol  (PROVENTIL HFA;VENTOLIN HFA) 108 (90 Base) MCG/ACT inhaler Inhale 2 puffs into the lungs every 4 (four) hours as needed for wheezing or shortness of breath. 10/30/17  Yes Palumbo, April, MD  aspirin EC 81 MG tablet Take 81 mg by mouth daily.   Yes [provider]  cefdinir (OMNICEF) 300 MG capsule Take 300 mg by mouth 2 (two) times daily. Started on 04-25-18 DS 14 04/25/18 05/09/18 Yes [provider]  clotrimazole-betamethasone (LOTRISONE) cream Apply 1 application topically 2 (two) times daily. 04/04/18  Yes Scot Jun, FNP  co-enzyme Q-10 30 MG capsule Take 30 mg by mouth daily.    Yes [provider]  diclofenac sodium (VOLTAREN) 1 % GEL Apply 4 g topically 3 (three) times daily as needed (for back pain). Patient taking differently: Apply 4 g topically 2 (two) times daily as needed (for back pain).  11/10/17  Yes Rory Percy, DO  diltiazem (CARDIZEM CD) 300 MG 24 hr capsule Take 1 capsule (300 mg total) by mouth daily. 02/19/18  Yes Camnitz, Ocie Doyne, MD  diltiazem (CARDIZEM) 30 MG tablet TAKE 1 TABLET BY MOUTH 4 TIMES DAILY AS NEEDED FOR AFIB Patient taking differently: Take 30 mg by mouth 4 (four) times daily as needed (AFIB).  03/29/18  Yes Camnitz, Will Hassell Done, MD  ferrous sulfate 325 (65 FE) MG tablet TAKE 1 TABLET BY MOUTH EVERY DAY 04/15/18  Yes Camnitz, Ocie Doyne, MD  hydrocortisone cream 1 % Apply to affected area 2 times daily 12/24/17  Yes Maczis, Barth Kirks, PA-C  magnesium hydroxide (DULCOLAX MILK OF MAGNESIA) 400 MG/5ML suspension Take 5 mLs by mouth daily as needed for mild constipation.   Yes [provider]  metroNIDAZOLE (FLAGYL) 500 MG tablet Take 500 mg by mouth 3 (three) times daily. Started on 04-25-18 DS 14 04/25/18 05/09/18 Yes [provider]  nitroGLYCERIN (NITROSTAT) 0.4 MG SL tablet Place 1 tablet (0.4 mg total) under the tongue every 5 (five) minutes x 3 doses as needed for chest pain. 11/19/15  Yes Kilroy, Luke K, PA-C    pantoprazole (PROTONIX) 40 MG tablet Take 1 tablet (40 mg total) by mouth 2 (two) times daily before a meal. 11/30/16  Yes Domenic Polite, MD  pravastatin (PRAVACHOL) 40 MG tablet Take 1 tablet (40 mg total) by mouth every evening. 05/04/17  Yes Leonie Man, MD  glucose blood (PRECISION QID TEST) test strip Check glucose twice daily 10/24/13   [provider]  Melatonin 3 MG TABS Take 1 tablet (3 mg total) by mouth at bedtime. Patient not taking: Reported on 04/13/2018 11/16/17   Doristine Mango L, DO  nystatin cream (MYCOSTATIN) Apply to affected area 2 times daily Patient not taking: Reported on 04/14/2018 01/02/18   Deno Etienne, DO  potassium chloride (K-DUR) 10 MEQ tablet Take 2 tablets (20 mEq total) by mouth daily for 2 days. 04/13/18 04/15/18  Nuala Alpha A, PA-C  vitamin C (ASCORBIC ACID) 250 MG tablet Take 1 tablet (250 mg total) by mouth daily. Patient not taking: Reported on 04/28/2018 01/20/18   Rise Mu, PA-C    Family History Family History  Problem Relation Age of Onset  . Breast cancer Mother   . Diabetes Mother   . Kidney disease Mother   . Heart attack Father   . Heart disease Father   . Heart attack Brother   . Leukemia Daughter     Social History Social History   Tobacco Use  . Smoking status: Current Every Day Smoker    Packs/day: 0.50    Years: 57.00    Pack years: 28.50    Types: Cigarettes  . Smokeless tobacco: Never Used  . Tobacco comment: He's been smoking between 0.5 and 2 ppd since age 80.  Substance Use Topics  . Alcohol use: No    Alcohol/week: 0.0 standard drinks    Comment: "quit drinking in 1986"  . Drug use: No     Allergies   Cyclobenzaprine   Review of Systems Review of Systems  Constitutional: Positive for irritability. Negative for chills and fever.  HENT: Negative for ear pain and sore throat.   Eyes: Negative for pain and visual disturbance.  Respiratory: Negative for cough and shortness of breath.    Cardiovascular: Negative for chest pain and palpitations.  Gastrointestinal: Negative for abdominal pain and vomiting.  Genitourinary: Negative for dysuria and hematuria.  Musculoskeletal: Negative for arthralgias and back pain.  Skin: Negative for color change and rash.  Neurological: Negative for seizures and syncope.  Psychiatric/Behavioral: Positive for suicidal ideas.  All other systems  reviewed and are negative.    Physical Exam Updated Vital Signs  ED Triage Vitals  Enc Vitals Group     BP 04/28/18 1050 131/89     Pulse Rate 04/28/18 1050 (!) 101     Resp 04/28/18 1050 20     Temp 04/28/18 1050 98.1 F (36.7 C)     Temp Source 04/28/18 1050 Oral     SpO2 04/28/18 1050 100 %     Weight 04/28/18 1049 145 lb (65.8 kg)     Height 04/28/18 1049 5\' 6"  (1.676 m)     Head Circumference --      Peak Flow --      Pain Score 04/28/18 1049 0     Pain Loc --      Pain Edu? --      Excl. in [place]? --      Physical Exam Vitals signs and nursing note reviewed.  Constitutional:      Appearance: He is well-developed.  HENT:     Head: Normocephalic and atraumatic.     Nose: Nose normal.  Eyes:     Extraocular Movements: Extraocular movements intact.     Conjunctiva/sclera: Conjunctivae normal.     Pupils: Pupils are equal, round, and reactive to light.  Neck:     Musculoskeletal: Normal range of motion and neck supple.  Cardiovascular:     Rate and Rhythm: Normal rate and regular rhythm.     Pulses: Normal pulses.     Heart sounds: Normal heart sounds. No murmur.  Pulmonary:     Effort: Pulmonary effort is normal. No respiratory distress.     Breath sounds: Normal breath sounds.  Abdominal:     General: There is no distension.     Palpations: Abdomen is soft.     Tenderness: There is no abdominal tenderness.  Skin:    General: Skin is warm and dry.     Capillary Refill: Capillary refill takes less than 2 seconds.  Neurological:     General: No focal deficit present.      Mental Status: He is alert.  Psychiatric:        Mood and Affect: Affect is angry.        Thought Content: Thought content includes suicidal ideation. Thought content does not include homicidal ideation. Thought content does not include homicidal or suicidal plan.      ED Treatments / Results  Labs (all labs ordered are listed, but only abnormal results are displayed) Labs Reviewed  COMPREHENSIVE METABOLIC PANEL - Abnormal; Notable for the following components:      Result Value   CO2 21 (*)    Glucose, Bld 244 (*)    Calcium 8.4 (*)    Total Protein 5.6 (*)    Albumin 2.8 (*)    All other components within normal limits  CBC WITH DIFFERENTIAL/PLATELET - Abnormal; Notable for the following components:   RBC 3.69 (*)    Hemoglobin 10.4 (*)    HCT 34.1 (*)    Lymphs Abs 0.4 (*)    All other components within normal limits  URINALYSIS, ROUTINE W REFLEX MICROSCOPIC - Abnormal; Notable for the following components:   Color, Urine RED (*)    APPearance CLOUDY (*)    pH 8.5 (*)    Hgb urine dipstick LARGE (*)    Protein, ur TRACE (*)    Leukocytes, UA TRACE (*)    All other components within normal limits  URINALYSIS, MICROSCOPIC (REFLEX) - Abnormal;  Notable for the following components:   Bacteria, UA MANY (*)    All other components within normal limits  URINE CULTURE  ETHANOL  RAPID URINE DRUG SCREEN, HOSP PERFORMED    EKG EKG Interpretation  Date/Time:  Sunday April 28 2018 11:06:29 EST Ventricular Rate:  100 PR Interval:    QRS Duration: 79 QT Interval:  354 QTC Calculation: 429 R Axis:   8 Text Interpretation:  sinus rhythm   Ventricular bigeminy no acute chnages Confirmed by Lennice Sites [phone]) on 04/28/2018 11:21:50 AM Also confirmed by Lennice Sites [phone]), editor Philomena Doheny [phone])  on 04/28/2018 1:37:19 PM   Radiology Dg Chest 2 View  Result Date: 04/28/2018 CLINICAL DATA:  Cough. EXAM: CHEST - 2 VIEW COMPARISON:  Radiographs of April 14, 2018. FINDINGS: The heart size and mediastinal contours are within normal limits. Both lungs are clear. The visualized skeletal structures are unremarkable. IMPRESSION: No active cardiopulmonary disease. Electronically Signed   By: Marijo Conception, M.D.   On: 04/28/2018 12:39    Procedures Procedures (including critical care time)  Medications Ordered in ED Medications  aspirin EC tablet 81 mg (81 mg Oral Given 04/28/18 1349)  diltiazem (CARDIZEM CD) 24 hr capsule 300 mg (has no administration in time range)  pantoprazole (PROTONIX) EC tablet 40 mg (has no administration in time range)  pravastatin (PRAVACHOL) tablet 40 mg (has no administration in time range)  albuterol (PROVENTIL HFA;VENTOLIN HFA) 108 (90 Base) MCG/ACT inhaler 2 puff (has no administration in time range)  cephALEXin (KEFLEX) capsule 500 mg (has no administration in time range)     Initial Impression / Assessment and Plan / ED Course  I have reviewed the triage vital signs and the nursing notes.  Pertinent labs & imaging results that were available during my care of the patient were reviewed by me and considered in my medical decision making (see chart for details).     [name] is a 78 year old male with history of atrial flutter, COPD who presents to the ED with suicidal ideation.  Patient recently discharged from [facility] after being there for 14 days.  Patient has unremarkable vitals.  No fever.  Has a Foley catheter in place due to retention in the past.  Patient was picked up by his daughter yesterday and states that he has had increasing suicidal thoughts throughout the night.  He does not want to live at home with his daughter.  He wants to be in a long-term living facility.  Patient denies any chest pain, shortness of breath.  He does not have a specific suicidal plan.  Medical clearance to be performed.  EKG shows sinus rhythm.  No signs of ischemic changes.  Unchanged from prior.  Patient had no significant  leukocytosis, anemia, electrolyte abnormality.  Urinalysis shows no nitrates with trace leukocytes.  Overall urinalysis appears clean.  Will send for urine culture.  Will treat with Keflex while awaiting culture.  Patient is not septic.  Not altered. Like contamination. No leukocytosis.  Social work and case management has been engaged while patient is awaiting psychiatric clearance.  Anticipate likely he will be cleared by psychiatry.  However, it appears that family may be refusing to take patient back home.  Awaiting final social work plan and final psychiatry evaluation. Would d/c on keflex if culture not returned by time of d/c.  This chart was dictated using voice recognition software.  Despite best efforts to proofread,  errors can occur which can change the  documentation meaning.   Final Clinical Impressions(s) / ED Diagnoses   Final diagnoses:  Suicidal ideation    ED Discharge Orders    None       Lennice Sites, DO 04/28/18 1520

## 2018-04-28 NOTE — ED Notes (Signed)
TTS being performed.  

## 2018-04-28 NOTE — ED Triage Notes (Signed)
Arrived via POV daughter dropped of patient. Patient discharged from [facility] 2 days ago after being there for 14 days. States they did not do anything for him while being there. States since being discharge has SI with plan to take medication and denies HI. States his mind is racing. Has a foley catheter in place prior to arrival light pink red in color urine.

## 2018-04-28 NOTE — ED Notes (Signed)
Pt ambulated to bathroom w/assistance - BM noted - ambulated back to room w/assistance.

## 2018-04-28 NOTE — ED Notes (Signed)
Pt has bloody urine with scattered blood clots foley bag emptied

## 2018-04-29 ENCOUNTER — Emergency Department (HOSPITAL_COMMUNITY)
Admission: EM | Admit: 2018-04-29 | Discharge: 2018-04-29 | Disposition: A | Discharge status: Home / Self Care | Payer: Medicare Other | Source: Home / Self Care | Attending: Emergency Medicine | Admitting: Emergency Medicine

## 2018-04-29 ENCOUNTER — Encounter (HOSPITAL_COMMUNITY): Payer: Self-pay

## 2018-04-29 DIAGNOSIS — R319 Hematuria, unspecified: Secondary | ICD-10-CM | POA: Diagnosis not present

## 2018-04-29 DIAGNOSIS — R531 Weakness: Secondary | ICD-10-CM | POA: Diagnosis not present

## 2018-04-29 DIAGNOSIS — N3091 Cystitis, unspecified with hematuria: Secondary | ICD-10-CM

## 2018-04-29 DIAGNOSIS — I4891 Unspecified atrial fibrillation: Secondary | ICD-10-CM | POA: Diagnosis not present

## 2018-04-29 DIAGNOSIS — M5489 Other dorsalgia: Secondary | ICD-10-CM | POA: Diagnosis not present

## 2018-04-29 DIAGNOSIS — E1165 Type 2 diabetes mellitus with hyperglycemia: Secondary | ICD-10-CM | POA: Diagnosis not present

## 2018-04-29 DIAGNOSIS — I959 Hypotension, unspecified: Secondary | ICD-10-CM | POA: Diagnosis not present

## 2018-04-29 LAB — CBC WITH DIFFERENTIAL/PLATELET
Abs Immature Granulocytes: 0.03 10*3/uL (ref 0.00–0.07)
Basophils Absolute: 0.1 10*3/uL (ref 0.0–0.1)
Basophils Relative: 1 %
Eosinophils Absolute: 0.1 10*3/uL (ref 0.0–0.5)
Eosinophils Relative: 1 %
HEMATOCRIT: 36.4 % — AB (ref 39.0–52.0)
Hemoglobin: 10.9 g/dL — ABNORMAL LOW (ref 13.0–17.0)
Immature Granulocytes: 0 %
Lymphocytes Relative: 3 %
Lymphs Abs: 0.3 10*3/uL — ABNORMAL LOW (ref 0.7–4.0)
MCH: 27.9 pg (ref 26.0–34.0)
MCHC: 29.9 g/dL — ABNORMAL LOW (ref 30.0–36.0)
MCV: 93.1 fL (ref 80.0–100.0)
MONO ABS: 0.5 10*3/uL (ref 0.1–1.0)
Monocytes Relative: 6 %
Neutro Abs: 7 10*3/uL (ref 1.7–7.7)
Neutrophils Relative %: 89 %
Platelets: 377 10*3/uL (ref 150–400)
RBC: 3.91 MIL/uL — ABNORMAL LOW (ref 4.22–5.81)
RDW: 15.3 % (ref 11.5–15.5)
WBC: 7.9 10*3/uL (ref 4.0–10.5)
nRBC: 0 % (ref 0.0–0.2)

## 2018-04-29 LAB — URINE CULTURE: Culture: NO GROWTH

## 2018-04-29 LAB — COMPREHENSIVE METABOLIC PANEL
ALK PHOS: 59 U/L (ref 38–126)
ALT: 29 U/L (ref 0–44)
AST: 27 U/L (ref 15–41)
Albumin: 3 g/dL — ABNORMAL LOW (ref 3.5–5.0)
Anion gap: 9 (ref 5–15)
BUN: 12 mg/dL (ref 8–23)
CO2: 24 mmol/L (ref 22–32)
Calcium: 9 mg/dL (ref 8.9–10.3)
Chloride: 106 mmol/L (ref 98–111)
Creatinine, Ser: 1.05 mg/dL (ref 0.61–1.24)
GFR calc Af Amer: >60 mL/min (ref >60)
GFR calc non Af Amer: >60 mL/min (ref >60)
Glucose, Bld: 133 mg/dL — ABNORMAL HIGH (ref 70–99)
Potassium: 3.9 mmol/L (ref 3.5–5.1)
Sodium: 139 mmol/L (ref 135–145)
Total Bilirubin: 0.5 mg/dL (ref 0.3–1.2)
Total Protein: 6.3 g/dL — ABNORMAL LOW (ref 6.5–8.1)

## 2018-04-29 LAB — I-STAT TROPONIN, ED: TROPONIN I, POC: 0 ng/mL (ref 0.00–0.08)

## 2018-04-29 MED ORDER — CEPHALEXIN 250 MG PO CAPS
500.0000 mg | ORAL_CAPSULE | Freq: Once | ORAL | Status: AC
  Administered 2018-04-29: 500 mg via ORAL
  Filled 2018-04-29: qty 2

## 2018-04-29 MED ORDER — CEPHALEXIN 500 MG PO CAPS: 500.0000 mg | ORAL_CAPSULE | Freq: Two times a day (BID) | ORAL | 0 refills | Status: DC

## 2018-04-29 NOTE — ED Provider Notes (Signed)
78 year old male with suicidal ideations.  Please see previous providers note for full H&P.  Patient's family will be picking up the patient as he is medically and psychiatrically cleared.  Patient is resting comfortably in exam bed at the time of disposition.  Patient also noted to have urine consistent with urinary tract infection he will be treated with Keflex.  Vitals:   04/28/18 2141 04/29/18 0639  BP: (!) 93/49 108/65  Pulse: 92 92  Resp: 18 18  Temp: 98.5 F (36.9 C) 98.4 F (36.9 C)  SpO2: 93% 98%      Okey Regal, PA-C 04/29/18 5868    Charlesetta Shanks, MD 05/05/18 [phone]

## 2018-04-29 NOTE — Telephone Encounter (Signed)
Left voice mail to call back 

## 2018-04-29 NOTE — ED Notes (Signed)
Per Social Work, pts daughter [name] [phone]) can pick pt up around 1000

## 2018-04-29 NOTE — ED Notes (Signed)
Pt ambulated independently to the restroom with no distress, pain or dizziness. Steady gait.

## 2018-04-29 NOTE — ED Notes (Signed)
Patient verbalizes understanding of discharge instructions. Opportunity for questioning and answers were provided. 

## 2018-04-29 NOTE — ED Notes (Signed)
Breakfast tray at bedside 

## 2018-04-29 NOTE — ED Provider Notes (Signed)
[facility] EMERGENCY DEPARTMENT Provider Note   CSN: [id number] Arrival date & time: 04/29/18  1201     History   Chief Complaint Chief Complaint  Patient presents with  . Weakness    HPI [name] is a 78 y.o. male with a PMH of persistent atrial fibrillation, T2DM, CVA, CHF, and COPD presenting with diffuse weakness onset 3 months ago. Patient was discharged from the ER yesterday and prescribed Keflex for a UTI. Patient has a chronic foley. Patient states he lives with daughter, but has not picked up his antibiotic yet. Patient states he is home alone most of the day without assistance. Patient reports he is typically able to ambulate with a cane, and states he is still able to ambulate with some difficulty. Patient reports intermittent dysuria and hematuria. Patient denies shortness of breath, fever, chest pain, back pain, abdominal pain, nausea, vomiting, or diarrhea.  Patient denies vision changes, dizziness, headache, syncope, numbness, or falls.    HPI  Past Medical History:  Diagnosis Date  . Allergic rhinitis   . Anemia    takes Ferrous Sulfate daily  . Arthritis    "all over"  . Atrial flutter ([place])   . Balance problem 01/2014  . CAP (community acquired pneumonia) 09/18/2014  . Cervical radiculopathy due to degenerative joint disease of spine   . Chronic coronary artery disease    a. 03/2010 Nonocclusive disease by cath, performed for ST elevations on ECG;  b. 06/2013 Lexi MV: EF 60%, no ischemia.  [name] COPD (chronic obstructive pulmonary disease) ([place])   . Diabetes mellitus type II    takes Metformin and Lantus daily  . Diastolic CHF, chronic ([place])    a. 12/2012 EF 55-60%, diast dysfxn, triv MR, mildly dil LA/RA.  [name] Essential hypertension    takes Diltiazem daily  . Frail elderly   . GERD (gastroesophageal reflux disease)   . History of blood transfusion 1982   "when I had stomach OR"  . History of bronchitis    1998  . History of CVA  (cerebrovascular accident)   . History of gastric ulcer   . Hypercholesterolemia    takes Pravastatin daily  . Intrinsic eczema   . Legally blind in left eye, as defined in [place]   . Obesity   . PAF (paroxysmal atrial fibrillation) (HCC)    Recurrent after atrial flutter (a. 07/2010 Status post caval tricuspid isthmus ablation by Dr. Midge Aver Metoprolol daily), currently controlled on flecainide plus diltiazem and Coumadin  . Vitamin D deficiency   . Weakness    numbness and tingling both hands    Patient Active Problem List   Diagnosis Date Noted  . Major depressive disorder, recurrent severe without psychotic features ([place]) 04/15/2018  . Depression   . Rapid atrial fibrillation ([place]) 01/18/2018  . Atrial fibrillation with RVR ([place]) 11/08/2017  . Chest pain with moderate risk of acute coronary syndrome 10/04/2017  . HTN (hypertension) 05/21/2017  . Diabetes mellitus type II 05/21/2017  . Diastolic CHF, chronic ([place]) 05/21/2017  . COPD (chronic obstructive pulmonary disease) ([place]) 05/21/2017  . H/O atrial flutter 05/21/2017  . Coronary disease/nonobstructive 05/21/2017  . Acute blood loss anemia 10/02/2016  . Abdominal pain 10/02/2016  . Constipation 10/02/2016  . Left lower quadrant pain   . Coagulopathy ([place])   . Heme positive stool   . Symptomatic anemia 07/10/2016  . Syncope 05/15/2015  . Cervical disc disease 05/15/2015  . Abnormal urinalysis 05/15/2015  . Anemia 05/15/2015  .  History of CVA (cerebrovascular accident) 04/03/2014  . Near syncope 02/15/2014  . Pre-syncope 02/15/2014  . Benign neoplasm of rectum and anal canal 12/02/2013  . Upper GI bleeding 11/30/2013  . Tobacco use disorder 11/30/2013  . Anticoagulation goal of INR 2 to 3, for PAF - CHA2DS2Vasc = 7 08/15/2013  . Type 2 diabetes mellitus ([place]) 01/22/2013  . Tobacco abuse counseling 12/15/2012  . Hyperlipidemia   . Obesity (BMI 30-39.9) 12/14/2012  . Persistent atrial fibrillation ([place]) 10/07/2010    . Atrial flutter - status post CTI ablation 07/29/2010  . Essential hypertension 07/29/2010  . Chronic diastolic congestive heart failure ([place]) 07/29/2010  . Coronary artery disease, non-occlusive 07/29/2010    Past Surgical History:  Procedure Laterality Date  . ATRIAL ABLATION SURGERY  08/05/10   CTI ablation for atrial flutter by JA  . CARDIAC CATHETERIZATION  2012   nl LV function, no occlusive CAD, PAF  . CARDIOVERSION  12/07/2010    Successful direct current cardioversion with atrial fibrillation to normal sinus rhythm  . CARPAL TUNNEL RELEASE Bilateral 01/30/2014   Procedure: BILATERAL CARPAL TUNNEL RELEASE;  Surgeon: Marianna Payment, MD;  Location: [facility];  Service: Orthopedics;  Laterality: Bilateral;  . CATARACT EXTRACTION W/ INTRAOCULAR LENS  IMPLANT, BILATERAL Bilateral   . COLONOSCOPY [name] 12/02/2013   Procedure: COLONOSCOPY;  Surgeon: Irene Shipper, MD;  Location: [facility];  Service: Endoscopy;  Laterality: [name];  . ESOPHAGOGASTRODUODENOSCOPY [name] 09/22/2014   Procedure: ESOPHAGOGASTRODUODENOSCOPY (EGD);  Surgeon: Ronald Lobo, MD;  Location: [facility] ENDOSCOPY;  Service: Endoscopy;  Laterality: [name];  . ESOPHAGOGASTRODUODENOSCOPY [name] 07/11/2016   Procedure: ESOPHAGOGASTRODUODENOSCOPY (EGD);  Surgeon: Doran Stabler, MD;  Location: [facility] ENDOSCOPY;  Service: Endoscopy;  Laterality: [name];  . INCISION AND DRAINAGE ABSCESS / HEMATOMA OF BURSA / KNEE / THIGH Left 1998   knee  . KNEE BURSECTOMY Left 1998  . LAPAROSCOPIC CHOLECYSTECTOMY  03/2010  . LEFT HEART CATH AND CORONARY ANGIOGRAPHY [name] 10/05/2017   Procedure: LEFT HEART CATH AND CORONARY ANGIOGRAPHY;  Surgeon: Martinique, Peter M, MD;  Location: [facility];  Service: Cardiovascular;  Laterality: [name];  . NM MYOVIEW LTD  07/22/2013   Normal EF ~60%, no ischemia or infarction.  [name] PARTIAL GASTRECTOMY  1982   subtotal; "took out 30% for ulcers"  . TRANSTHORACIC ECHOCARDIOGRAM  02/16/2014   EF 60%, no RWMA. - otherwise normal  .  YAG LASER APPLICATION Bilateral         Home Medications    Prior to Admission medications   Medication Sig Start Date End Date Taking? Authorizing Provider  albuterol (PROVENTIL HFA;VENTOLIN HFA) 108 (90 Base) MCG/ACT inhaler Inhale 2 puffs into the lungs every 4 (four) hours as needed for wheezing or shortness of breath. 10/30/17   Palumbo, April, MD  aspirin EC 81 MG tablet Take 81 mg by mouth daily.    [provider]  cefdinir (OMNICEF) 300 MG capsule Take 300 mg by mouth 2 (two) times daily. Started on 04-25-18 DS 14 04/25/18 05/09/18  [provider]  cephALEXin (KEFLEX) 500 MG capsule Take 1 capsule (500 mg total) by mouth 2 (two) times daily. 04/29/18   Hedges, Dellis Filbert, PA-C  clotrimazole-betamethasone (LOTRISONE) cream Apply 1 application topically 2 (two) times daily. 04/04/18   Scot Jun, FNP  co-enzyme Q-10 30 MG capsule Take 30 mg by mouth daily.     [provider]  diclofenac sodium (VOLTAREN) 1 % GEL Apply 4 g topically 3 (three) times daily as  needed (for back pain). Patient taking differently: Apply 4 g topically 2 (two) times daily as needed (for back pain).  11/10/17   Rory Percy, DO  diltiazem (CARDIZEM CD) 300 MG 24 hr capsule Take 1 capsule (300 mg total) by mouth daily. 02/19/18   Camnitz, Ocie Doyne, MD  diltiazem (CARDIZEM) 30 MG tablet TAKE 1 TABLET BY MOUTH 4 TIMES DAILY AS NEEDED FOR AFIB Patient taking differently: Take 30 mg by mouth 4 (four) times daily as needed (AFIB).  03/29/18   Camnitz, Will Hassell Done, MD  ferrous sulfate 325 (65 FE) MG tablet TAKE 1 TABLET BY MOUTH EVERY DAY 04/15/18   Camnitz, Ocie Doyne, MD  glucose blood (PRECISION QID TEST) test strip Check glucose twice daily 10/24/13   [provider]  hydrocortisone cream 1 % Apply to affected area 2 times daily 12/24/17   Maczis, Barth Kirks, PA-C  magnesium hydroxide (DULCOLAX MILK OF MAGNESIA) 400 MG/5ML suspension Take 5 mLs by mouth daily as needed for mild  constipation.    [provider]  Melatonin 3 MG TABS Take 1 tablet (3 mg total) by mouth at bedtime. Patient not taking: Reported on 04/13/2018 11/16/17   Doristine Mango L, DO  metroNIDAZOLE (FLAGYL) 500 MG tablet Take 500 mg by mouth 3 (three) times daily. Started on 04-25-18 DS 14 04/25/18 05/09/18  [provider]  nitroGLYCERIN (NITROSTAT) 0.4 MG SL tablet Place 1 tablet (0.4 mg total) under the tongue every 5 (five) minutes x 3 doses as needed for chest pain. 11/19/15   Erlene Quan, PA-C  nystatin cream (MYCOSTATIN) Apply to affected area 2 times daily Patient not taking: Reported on 04/14/2018 01/02/18   Deno Etienne, DO  pantoprazole (PROTONIX) 40 MG tablet Take 1 tablet (40 mg total) by mouth 2 (two) times daily before a meal. 11/30/16   Domenic Polite, MD  potassium chloride (K-DUR) 10 MEQ tablet Take 2 tablets (20 mEq total) by mouth daily for 2 days. 04/13/18 04/15/18  Nuala Alpha A, PA-C  pravastatin (PRAVACHOL) 40 MG tablet Take 1 tablet (40 mg total) by mouth every evening. 05/04/17   Leonie Man, MD  vitamin C (ASCORBIC ACID) 250 MG tablet Take 1 tablet (250 mg total) by mouth daily. Patient not taking: Reported on 04/28/2018 01/20/18   Rise Mu, PA-C    Family History Family History  Problem Relation Age of Onset  . Breast cancer Mother   . Diabetes Mother   . Kidney disease Mother   . Heart attack Father   . Heart disease Father   . Heart attack Brother   . Leukemia Daughter     Social History Social History   Tobacco Use  . Smoking status: Current Every Day Smoker    Packs/day: 0.50    Years: 57.00    Pack years: 28.50    Types: Cigarettes  . Smokeless tobacco: Never Used  . Tobacco comment: He's been smoking between 0.5 and 2 ppd since age 14.  Substance Use Topics  . Alcohol use: No    Alcohol/week: 0.0 standard drinks    Comment: "quit drinking in 1986"  . Drug use: No     Allergies   Cyclobenzaprine   Review of  Systems Review of Systems  Constitutional: Negative for activity change, appetite change, chills, diaphoresis, fatigue, fever and unexpected weight change.  HENT: Negative for congestion, rhinorrhea and sore throat.   Eyes: Negative for photophobia and visual disturbance.  Respiratory: Negative for cough, chest tightness and  shortness of breath.   Cardiovascular: Negative for chest pain, palpitations and leg swelling.  Gastrointestinal: Negative for abdominal pain, blood in stool, diarrhea, nausea and vomiting.  Endocrine: Negative for cold intolerance and heat intolerance.  Genitourinary: Positive for dysuria and hematuria. Negative for discharge, flank pain, frequency, penile pain and penile swelling.  Musculoskeletal: Positive for gait problem. Negative for myalgias and neck pain.  Skin: Negative for wound.  Allergic/Immunologic: Negative for immunocompromised state.  Neurological: Positive for weakness. Negative for dizziness, tremors, seizures, syncope, facial asymmetry, speech difficulty, light-headedness, numbness and headaches.  Psychiatric/Behavioral: Negative for confusion, dysphoric mood and sleep disturbance. The patient is not nervous/anxious.     Physical Exam Updated Vital Signs BP (!) 111/93   Pulse 86   Temp 98.6 F (37 C) (Oral)   Resp 20   SpO2 99%   Physical Exam Vitals signs and nursing note reviewed.  Constitutional:      General: He is not in acute distress.    Appearance: He is well-developed. He is not diaphoretic.  HENT:     Head: Normocephalic and atraumatic.     Mouth/Throat:     Mouth: Mucous membranes are moist.     Pharynx: No oropharyngeal exudate or posterior oropharyngeal erythema.  Eyes:     Extraocular Movements: Extraocular movements intact.     Conjunctiva/sclera: Conjunctivae normal.     Pupils: Pupils are equal, round, and reactive to light.  Neck:     Musculoskeletal: Normal range of motion and neck supple.  Cardiovascular:     Rate  and Rhythm: Normal rate and regular rhythm.     Heart sounds: Normal heart sounds. No murmur. No friction rub. No gallop.   Pulmonary:     Effort: Pulmonary effort is normal. No respiratory distress.     Breath sounds: Normal breath sounds. No wheezing or rales.  Abdominal:     General: There is no distension.     Palpations: Abdomen is soft.     Tenderness: There is no abdominal tenderness.  Musculoskeletal: Normal range of motion.     Right lower leg: No edema.     Left lower leg: No edema.  Skin:    General: Skin is warm.     Findings: No erythema or rash.  Neurological:     Mental Status: He is alert and oriented to person, place, and time.    Mental Status:  Alert, oriented, thought content appropriate, able to give a coherent history. Speech fluent without evidence of aphasia. Able to follow 2 step commands without difficulty.  Cranial Nerves:  II:  Peripheral visual fields grossly normal, pupils equal, round, reactive to light III,IV, VI: ptosis not present, extra-ocular motions intact bilaterally  V,VII: smile symmetric, facial light touch sensation equal VIII: hearing grossly normal to voice  X: uvula elevates symmetrically  XI: bilateral shoulder shrug symmetric and strong XII: midline tongue extension without fassiculations Motor:  Normal tone. 5/5 in upper and lower extremities bilaterally including strong and equal grip strength and dorsiflexion/plantar flexion Sensory: Pinprick and light touch normal in all extremities.  Deep Tendon Reflexes: 2+ and symmetric in the biceps and patella Cerebellar: normal finger-to-nose with bilateral upper extremities Gait: normal gait and balance. Negative pronator drift. Negative Romberg sign. CV: distal pulses palpable throughout    ED Treatments / Results  Labs (all labs ordered are listed, but only abnormal results are displayed) Labs Reviewed  COMPREHENSIVE METABOLIC PANEL - Abnormal; Notable for the following components:  Result Value   Glucose, Bld 133 (*)    Total Protein 6.3 (*)    Albumin 3.0 (*)    All other components within normal limits  CBC WITH DIFFERENTIAL/PLATELET - Abnormal; Notable for the following components:   RBC 3.91 (*)    Hemoglobin 10.9 (*)    HCT 36.4 (*)    MCHC 29.9 (*)    Lymphs Abs 0.3 (*)    All other components within normal limits  I-STAT TROPONIN, ED   Hemoglobin  Date Value Ref Range Status  04/29/2018 10.9 (L) 13.0 - 17.0 g/dL Final  04/28/2018 10.4 (L) 13.0 - 17.0 g/dL Final  04/14/2018 10.5 (L) 13.0 - 17.0 g/dL Final  04/13/2018 10.8 (L) 13.0 - 17.0 g/dL Final  02/27/2018 11.5 (L) 13.0 - 17.7 g/dL Final  02/13/2018 12.8 (L) 13.0 - 17.0 g/dL Final  01/02/2018 11.8 (L) 13.0 - 17.0 g/dL Final  11/21/2017 10.4 (L) 13.0 - 17.1 g/dL Final  10/10/2017 10.9 (L) 13.0 - 17.1 g/dL Final   HGB  Date Value Ref Range Status  01/12/2017 11.8 (L) 13.0 - 17.1 g/dL Final  12/08/2016 10.8 (L) 13.0 - 17.1 g/dL Final  10/20/2016 8.1 (L) 13.0 - 17.1 g/dL Final    EKG EKG Interpretation  Date/Time:  Monday April 29 2018 12:02:11 EST Ventricular Rate:  108 PR Interval:    QRS Duration: 85 QT Interval:  345 QTC Calculation: 441 R Axis:   52 Text Interpretation:  Atrial fibrillation ST elevation suggests acute pericarditis since last tracing no significant change Confirmed by Malvin Johns [phone]) on 04/29/2018 2:57:21 PM  Radiology Dg Chest 2 View  Result Date: 04/28/2018 CLINICAL DATA:  Cough. EXAM: CHEST - 2 VIEW COMPARISON:  Radiographs of April 14, 2018. FINDINGS: The heart size and mediastinal contours are within normal limits. Both lungs are clear. The visualized skeletal structures are unremarkable. IMPRESSION: No active cardiopulmonary disease. Electronically Signed   By: Marijo Conception, M.D.   On: 04/28/2018 12:39    Procedures Procedures (including critical care time)  Medications Ordered in ED Medications  cephALEXin (KEFLEX) capsule 500 mg (500 mg  Oral Given 04/29/18 1409)     Initial Impression / Assessment and Plan / ED Course  I have reviewed the triage vital signs and the nursing notes.  Pertinent labs & imaging results that were available during my care of the patient were reviewed by me and considered in my medical decision making (see chart for details).    Suspect symptoms are likely due to UTI based on history, physical exam, and workup. Pt has been diagnosed with a UTI. UA from 04/28/2018 reveals leukocytes, RBCs, and many bacteria consistent with a UTI. Patient also reports dysuria. Urine culture is pending. Pt is afebrile, no CVA tenderness, normotensive, and denies N/V. Provided antibiotics while in the ER. Patient is able to ambulate. Pt advised to continue antibiotics as previously prescribed and instructions to follow up with PCP if symptoms persist.  Final Clinical Impressions(s) / ED Diagnoses   Final diagnoses:  Generalized weakness  Cystitis with hematuria    ED Discharge Orders    None       Arville Lime, PA-C 04/29/18 1500    Charlesetta Shanks, MD 05/05/18 [phone]

## 2018-04-29 NOTE — ED Notes (Signed)
Pt states hes leaving by 4 PM.

## 2018-04-29 NOTE — Discharge Instructions (Addendum)
You have been seen today for weakness. Please read and follow all provided instructions.   1. Medications: continue antibiotic as previously prescribed, usual home medications 2. Treatment: rest, drink plenty of fluids 3. Follow Up: Please follow up with your primary doctor in 2 days for discussion of your diagnoses and further evaluation after today's visit; if you do not have a primary care doctor use the resource guide provided to find one; Please return to the ER for any new or worsening symptoms. Please obtain all of your results from medical records or have your doctors office obtain the results - share them with your doctor - you should be seen at your doctors office. Call today to arrange your follow up.   Take medications as prescribed. Please review all of the medicines and only take them if you do not have an allergy to them. Return to the emergency room for worsening condition or new concerning symptoms. Follow up with your regular doctor. If you don't have a regular doctor use one of the numbers below to establish a primary care doctor.  Please be aware that if you are taking birth control pills, taking other prescriptions, ESPECIALLY ANTIBIOTICS may make the birth control ineffective - if this is the case, either do not engage in sexual activity or use alternative methods of birth control such as condoms until you have finished the medicine and your family doctor says it is OK to restart them. If you are on a blood thinner such as COUMADIN, be aware that any other medicine that you take may cause the coumadin to either work too much, or not enough - you should have your coumadin level rechecked in next 7 days if this is the case.  ?  It is also a possibility that you have an allergic reaction to any of the medicines that you have been prescribed - Everybody reacts differently to medications and while MOST people have no trouble with most medicines, you may have a reaction such as nausea,  vomiting, rash, swelling, shortness of breath. If this is the case, please stop taking the medicine immediately and contact your physician.  ?  You should return to the ER if you develop severe or worsening symptoms.   Emergency Department Resource Guide 1) Find a Doctor and Pay Out of Pocket Although you won't have to find out who is covered by your insurance plan, it is a good idea to ask around and get recommendations. You will then need to call the office and see if the doctor you have chosen will accept you as a new patient and what types of options they offer for patients who are self-pay. Some doctors offer discounts or will set up payment plans for their patients who do not have insurance, but you will need to ask so you aren't surprised when you get to your appointment.  2) Contact Your Local Health Department Not all health departments have doctors that can see patients for sick visits, but many do, so it is worth a call to see if yours does. If you don't know where your local health department is, you can check in your phone book. The CDC also has a tool to help you locate your state's health department, and many state websites also have listings of all of their local health departments.  3) Find a [facility] If your illness is not likely to be very severe or complicated, you may want to try a walk in clinic. These are  popping up all over the country in pharmacies, drugstores, and shopping centers. They're usually staffed by nurse practitioners or physician assistants that have been trained to treat common illnesses and complaints. They're usually fairly quick and inexpensive. However, if you have serious medical issues or chronic medical problems, these are probably not your best option.  No Primary Care Doctor: Call Health Connect at  [phone] - they can help you locate a primary care doctor that  accepts your insurance, provides certain services, etc. Physician Referral [phone]  Emergency Department Resource Guide 1) Find a Doctor and Pay Out of Pocket Although you won't have to find out who is covered by your insurance plan, it is a good idea to ask around and get recommendations. You will then need to call the office and see if the doctor you have chosen will accept you as a new patient and what types of options they offer for patients who are self-pay. Some doctors offer discounts or will set up payment plans for their patients who do not have insurance, but you will need to ask so you aren't surprised when you get to your appointment.  2) Contact Your Local Health Department Not all health departments have doctors that can see patients for sick visits, but many do, so it is worth a call to see if yours does. If you don't know where your local health department is, you can check in your phone book. The CDC also has a tool to help you locate your state's health department, and many state websites also have listings of all of their local health departments.  3) Find a [facility] If your illness is not likely to be very severe or complicated, you may want to try a walk in clinic. These are popping up all over the country in pharmacies, drugstores, and shopping centers. They're usually staffed by nurse practitioners or physician assistants that have been trained to treat common illnesses and complaints. They're usually fairly quick and inexpensive. However, if you have serious medical issues or chronic medical problems, these are probably not your best option.  No Primary Care Doctor: Call Health Connect at  [phone] - they can help you locate a primary care doctor that  accepts your insurance, provides certain services, etc. Physician Referral Service- [phone]  Chronic Pain Problems: Organization         Address  Phone   Notes  [facility]  [phone] Patients need to be referred by their primary care doctor.    Medication Assistance: Organization         Address  Phone   Notes  [facility] [address] [place], [place] [address] [phone] --Must be a resident of [facility] -- Must have NO insurance coverage whatsoever (no Medicaid/ Medicare, etc.) -- The pt. MUST have a primary care doctor that directs their care regularly and follows them in the community   [facility]  [phone]   [employer]  [phone]    Agencies that provide inexpensive medical care: Organization         Address  Phone   Notes  [place]  [phone]   [facility]    [phone]   [facility] [place], [place] [address] [phone]   [place] [address] [place] [phone]   [facility]    [phone])  [facility]    [phone]   [place] [address], [place] Phone:  [phone], Fax:  [phone] Hours of Operation:  9 am - 6 pm, M-F.  Also accepts Medicaid/Medicare and self-pay.  [facility] for [place], [address], [place] Phone: [phone], Fax: [phone]. Hours of Operation:  8:30 am - 5:30 pm, M-F.  Also accepts Medicaid and self-pay.  [facility] [place] [address], [place] Phone: [phone]   [place], [place], [place] [phone], Ext. [phone] Mondays & Thursdays: 7-9 AM.  First 15 patients are seen on a first come, first serve basis.    [place] Providers:  Organization         Address  Phone   Notes  [facility] [address], [place] [phone] Also accepts self-pay patients.  [facility] [address], [place]  [phone]   [place], [address], [place] [phone]   [facility] [address], [place] [phone]   Lucianne Lei [address], [place]   [phone] Only accepts [place] patients after they have their name applied to their card.   Self-Pay (no insurance) in [facility]:  Organization         Address  Phone   Notes  Sickle Cell Patients, [facility] [place] [phone]   [facility] [place] [phone]   [facility] [place]  [place], [place], [place] [phone]   [facility]/Dr. Osei-Bonsu  [address] [place] or [address], [place] [phone] Phone number for both [place] and [place] locations is the same.  [facility] [address], [place] [phone]   [facility] [address] [place] or [address] [phone]   [facility] [address] [place] [address] [phone], phone; [phone], fax Sees patients 1st and 3rd Saturday of every month.  Must not qualify for public or private insurance (i.e. Medicaid, Medicare, [place] Health Choice, Veterans' Benefits)  Household income should be no more than 200% of the poverty level The clinic cannot treat you if you are pregnant or think you are pregnant  Sexually transmitted diseases are not treated at the clinic.

## 2018-04-29 NOTE — ED Triage Notes (Signed)
Pt presents via [facility] for evaluation of ongoing weakness. States blood in urine was not addressed yesterday.

## 2018-04-29 NOTE — ED Notes (Signed)
Patient verbalizes understanding of discharge instructions. Opportunity for questioning and answers were provided. Pt discharged from ED. 

## 2018-04-29 NOTE — Progress Notes (Signed)
CSW aware patient has been psychiatrically and medically cleared for discharge. CSW reached back out to patient's daughter, Dimas Millin [phone], regarding patient disposition. [name] unaware patient was ready for discharge and was informed by patient that he was going to be admitted. CSW explained patient did not meet inpatient psychiatric criteria and was medically cleared so was ready for discharge. Patient's daughter expressed frustration as patient's frequent visits are making her late for work. CSW explained patient could not remain in the ED and would need a discharge plan. Per [name], she and her husband were going to a doctors appointment but could pick patient up around 10am. Patient's daughter to call back if it will be later than that. CSW to update patient's RN.  Ollen Barges, [place] Work Department  [employer]  [phone]

## 2018-04-30 NOTE — Telephone Encounter (Signed)
Daughter has spoken to CSW while patient has been in the ED & been notified of the information below. Also given information as far as resources & the steps to getting patient in a long term facility.

## 2018-05-03 DIAGNOSIS — R338 Other retention of urine: Secondary | ICD-10-CM | POA: Diagnosis not present

## 2018-05-10 ENCOUNTER — Ambulatory Visit: Payer: Medicare Other | Admitting: Cardiology

## 2018-05-13 ENCOUNTER — Emergency Department (HOSPITAL_COMMUNITY)
Admission: EM | Admit: 2018-05-13 | Discharge: 2018-05-13 | Discharge status: Against Medical Advice | Payer: Medicare Other

## 2018-05-16 DIAGNOSIS — R338 Other retention of urine: Secondary | ICD-10-CM | POA: Diagnosis not present

## 2018-05-17 IMAGING — CR DG HIP (WITH OR WITHOUT PELVIS) 2-3V*R*
3 series · 3 of 3 positions shown · non-contrast
Comparison: None.

CLINICAL DATA: Right hip pain after fall 2 days ago.

EXAM:
DG HIP (WITH OR WITHOUT PELVIS) 2-3V RIGHT

[t pelvis a.p.]
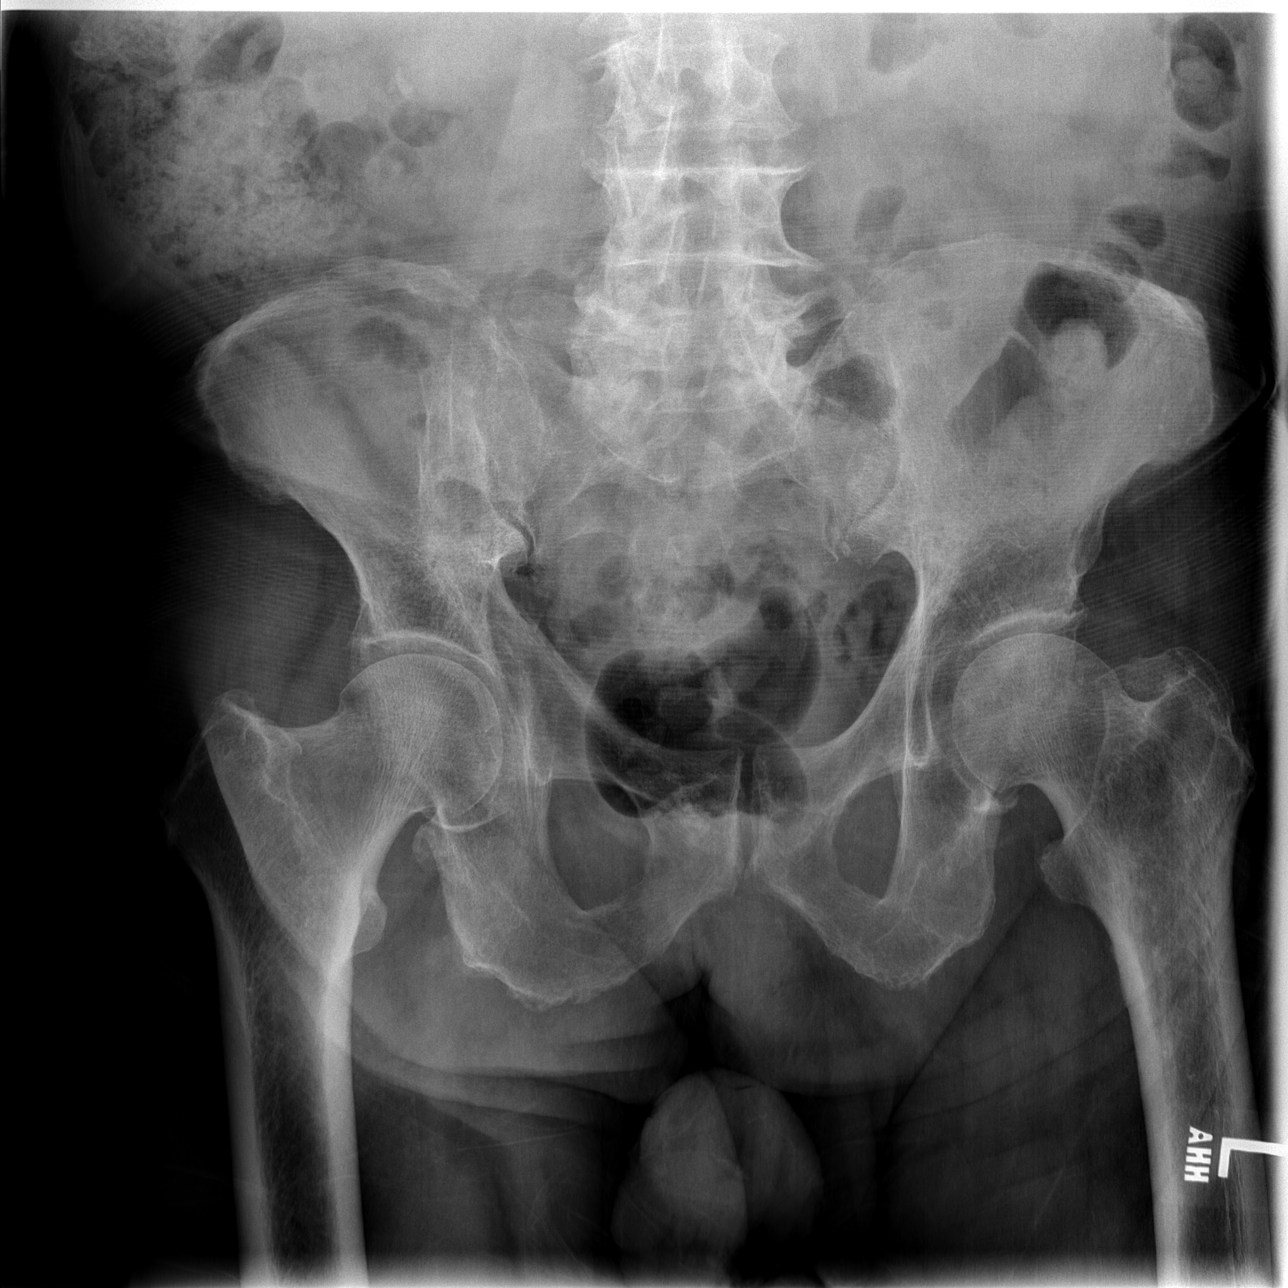

[t hip ap right]
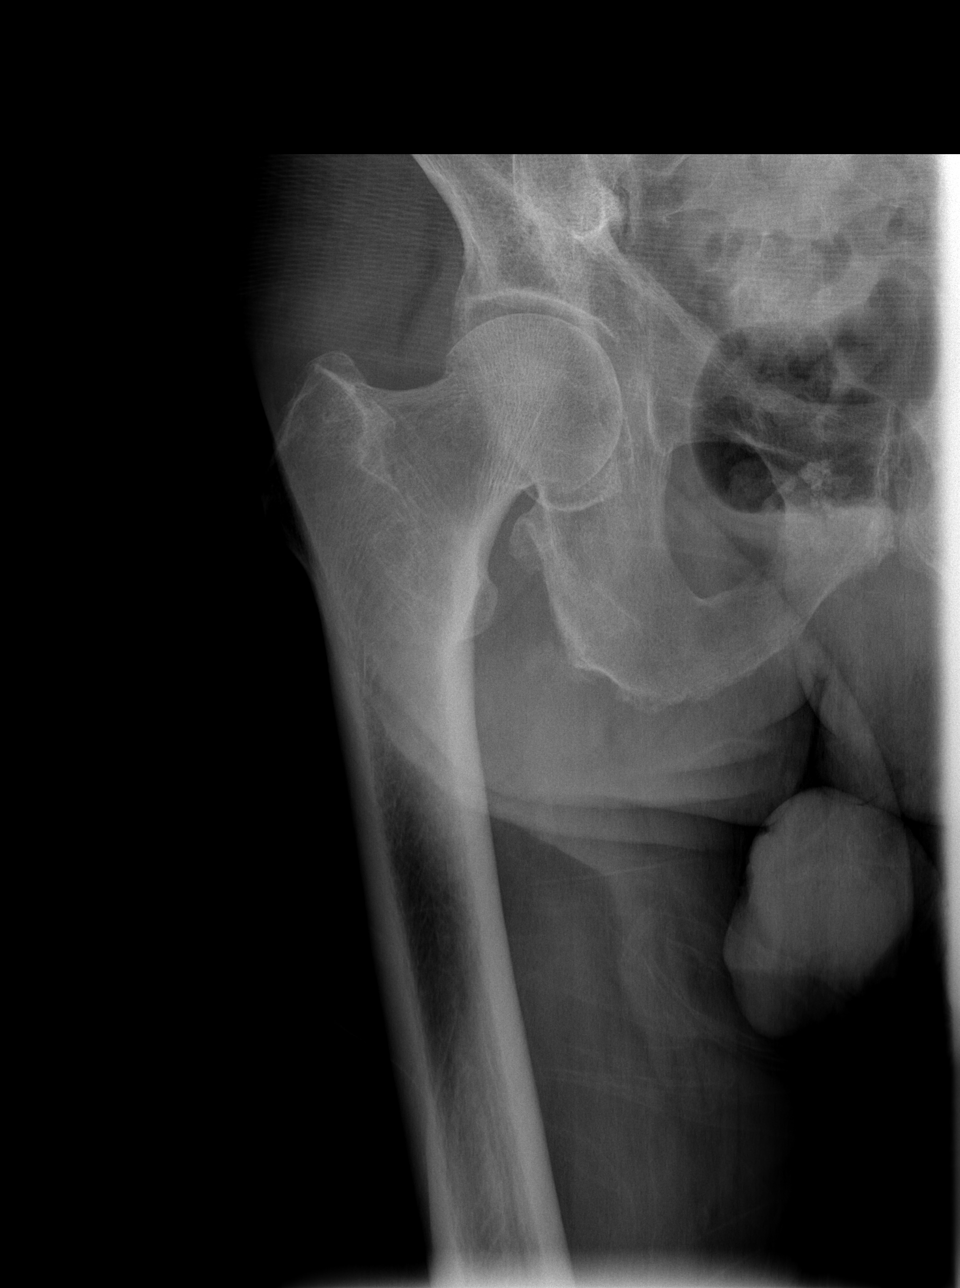

[t hip frog leg right]
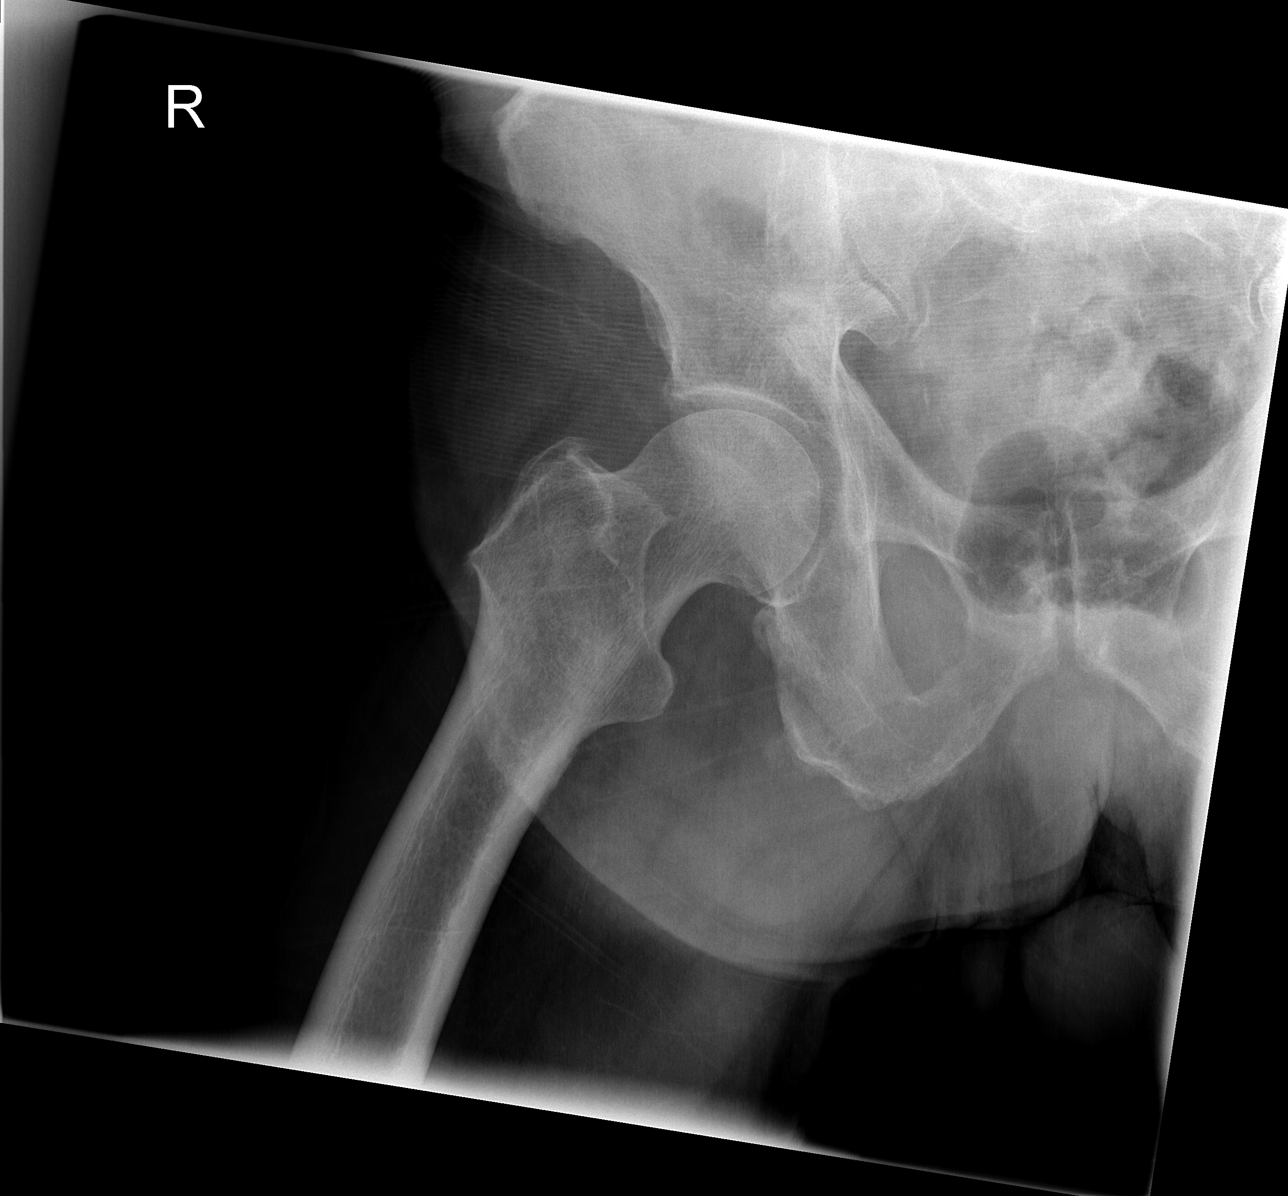

[3 of 3 positions shown; findings below may reference images not displayed]

FINDINGS: There is no evidence of hip fracture or dislocation. There is no
evidence of arthropathy or other focal bone abnormality.
IMPRESSION: Normal right hip.

## 2018-05-17 IMAGING — CR DG FOOT COMPLETE 3+V*L*
3 series · 3 of 3 positions shown · non-contrast
Comparison: None.

CLINICAL DATA: Left foot pain after fall 2 days ago.

EXAM:
LEFT FOOT - COMPLETE 3+ VIEW

[t foot ap left]
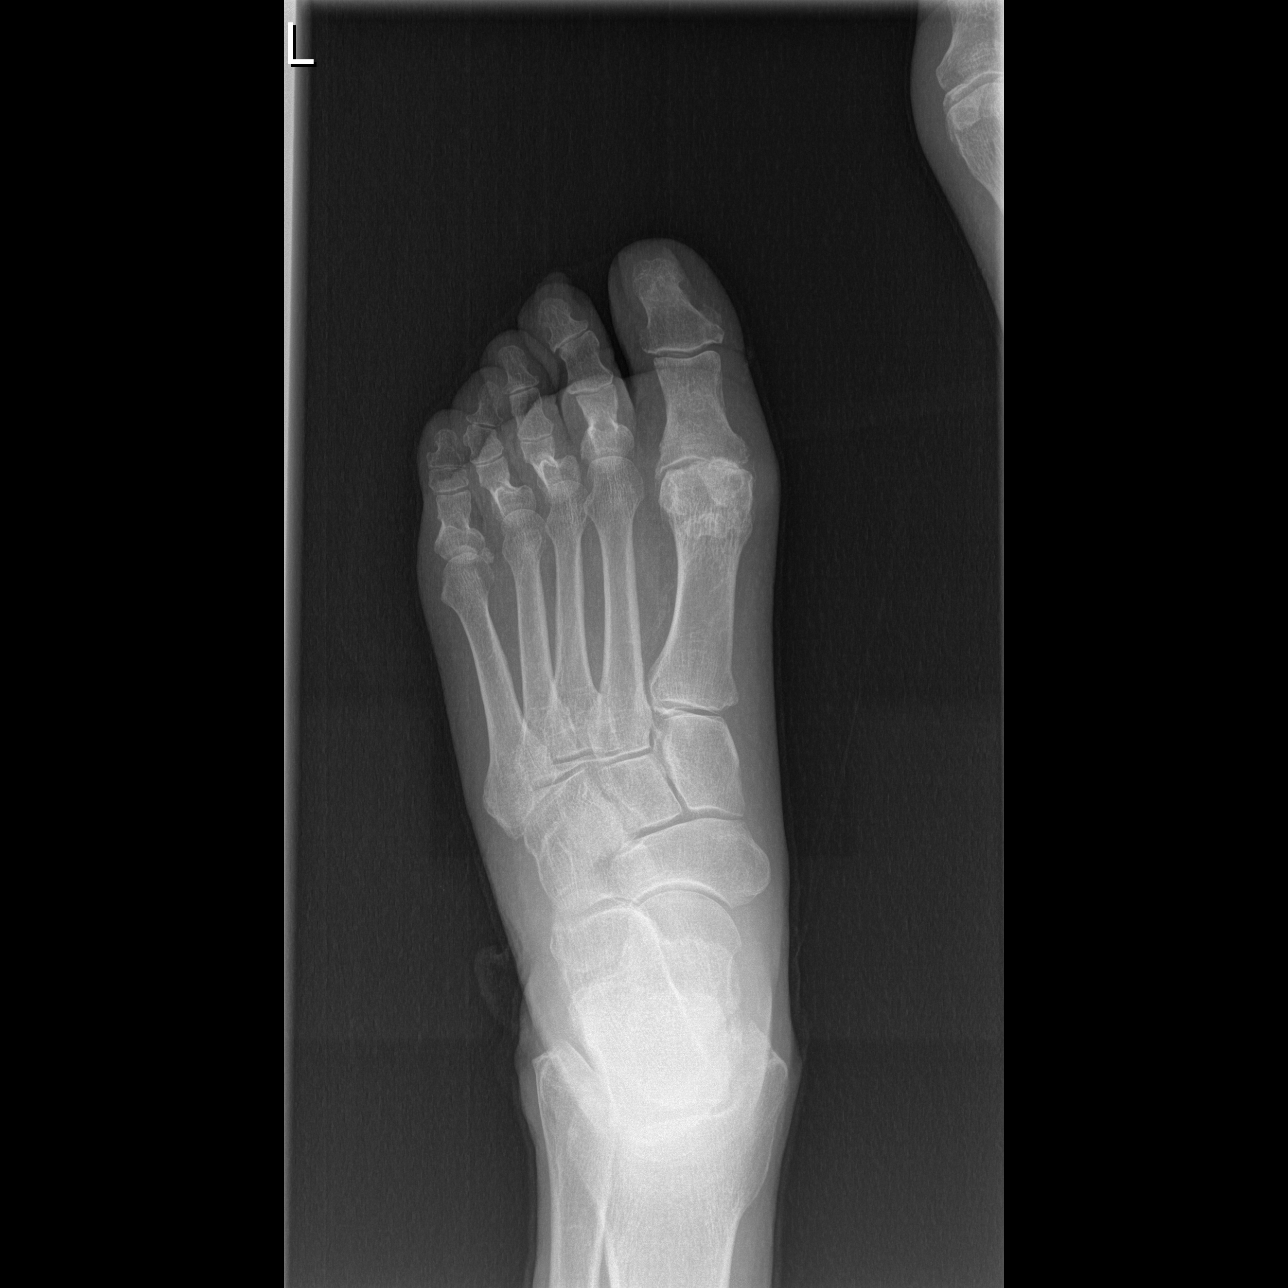

[t foot oblique left]
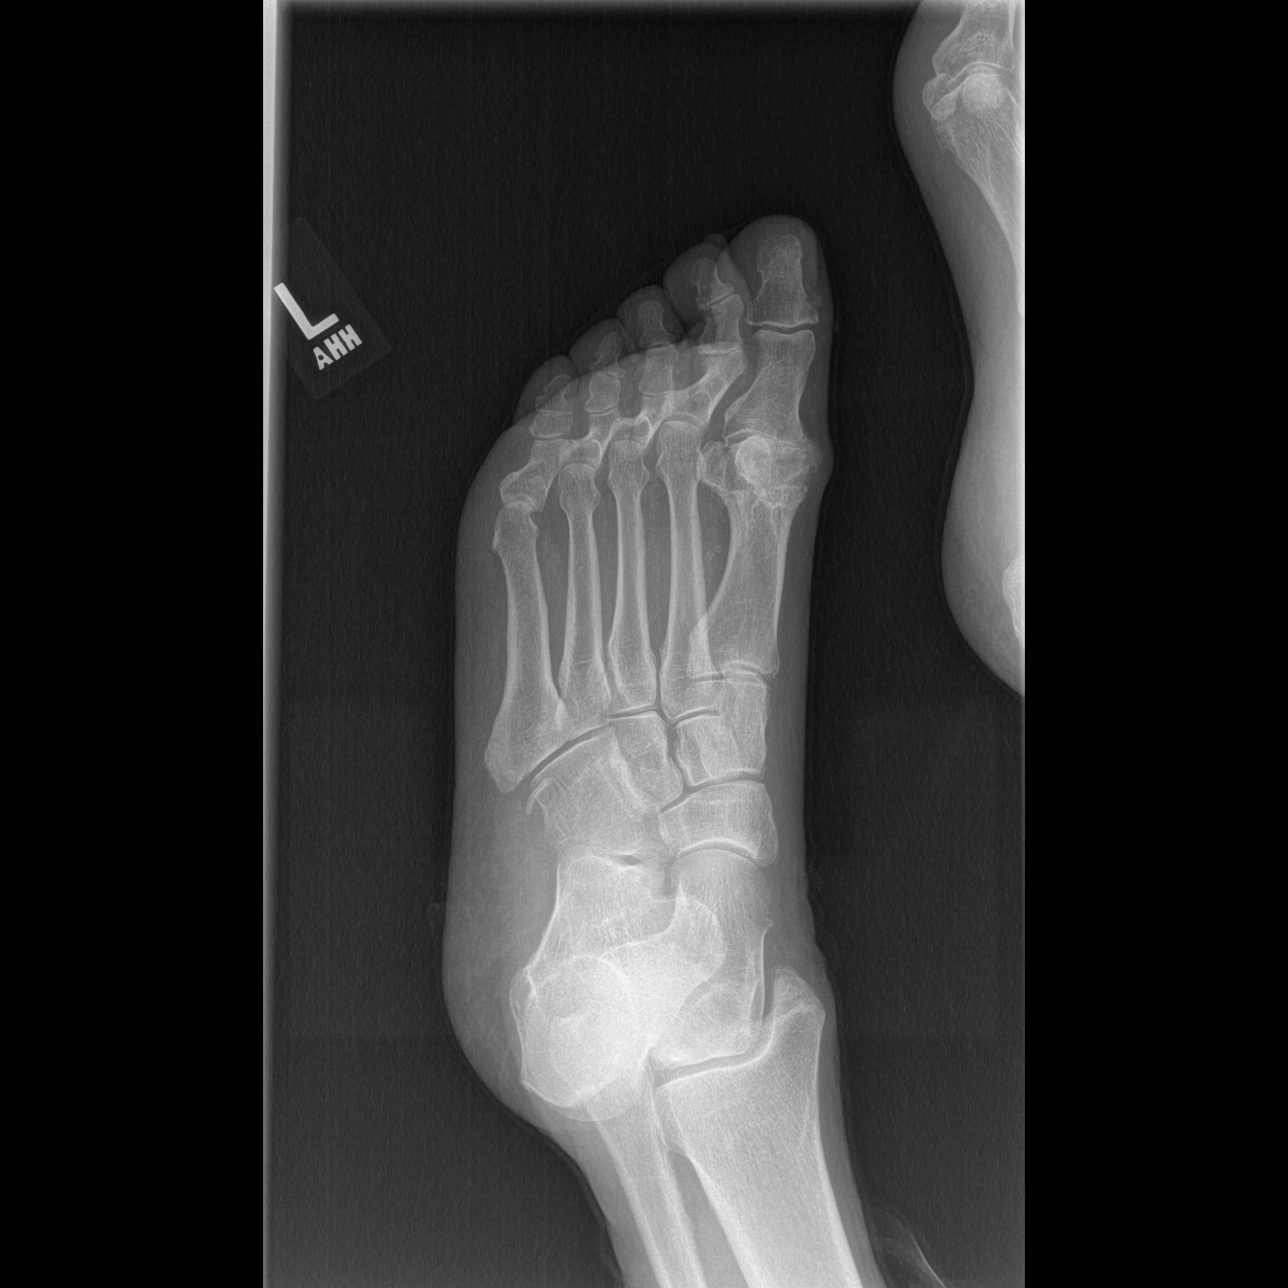

[t foot lat left]
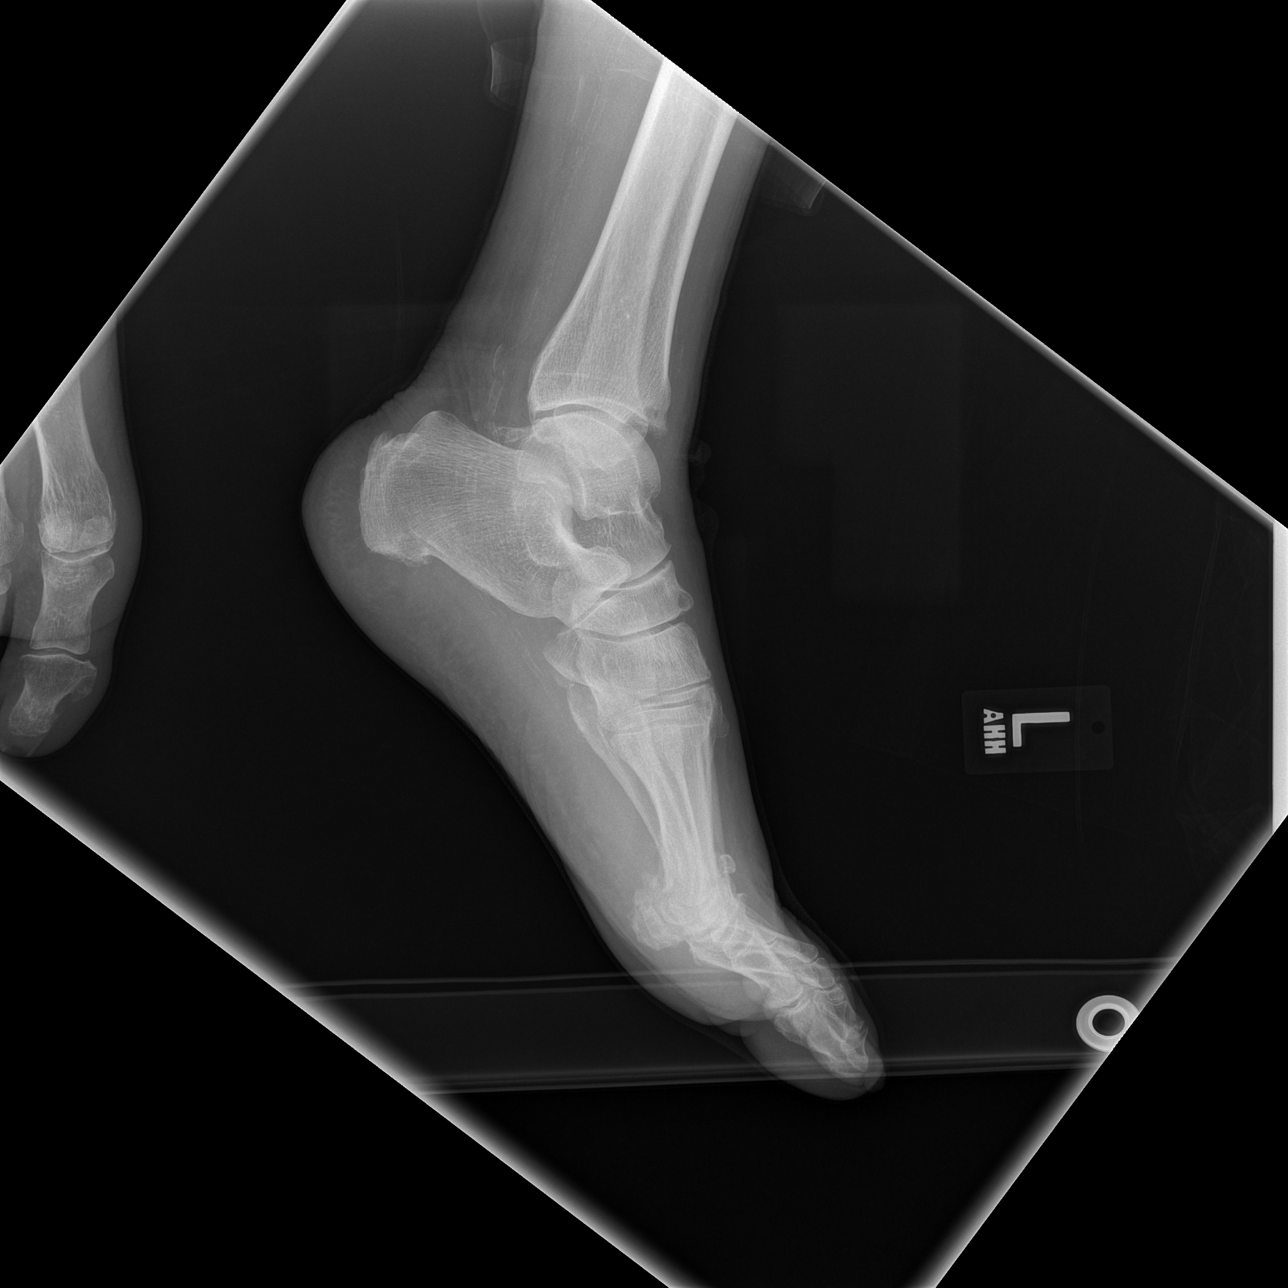

[3 of 3 positions shown; findings below may reference images not displayed]

FINDINGS: No fracture or dislocation is noted. Vascular calcifications are
noted. Mild posterior calcaneal spurring is noted. Moderate
degenerative changes seen involving the first metatarsophalangeal
joint.
IMPRESSION: Moderate degenerative joint disease of the first metatarsophalangeal
joint. No acute abnormality seen in the left foot.

## 2018-05-20 ENCOUNTER — Other Ambulatory Visit: Payer: Self-pay

## 2018-05-20 ENCOUNTER — Ambulatory Visit (INDEPENDENT_AMBULATORY_CARE_PROVIDER_SITE_OTHER): Payer: Medicare Other | Admitting: Family Medicine

## 2018-05-20 ENCOUNTER — Encounter: Payer: Self-pay | Admitting: Family Medicine

## 2018-05-20 VITALS — BP 125/62 | HR 84 | Temp 97.5°F | Resp 17 | Ht 66.0 in | Wt 162.6 lb

## 2018-05-20 DIAGNOSIS — I4891 Unspecified atrial fibrillation: Secondary | ICD-10-CM

## 2018-05-20 DIAGNOSIS — N138 Other obstructive and reflux uropathy: Secondary | ICD-10-CM | POA: Diagnosis not present

## 2018-05-20 DIAGNOSIS — F321 Major depressive disorder, single episode, moderate: Secondary | ICD-10-CM | POA: Diagnosis not present

## 2018-05-20 DIAGNOSIS — R339 Retention of urine, unspecified: Secondary | ICD-10-CM

## 2018-05-20 DIAGNOSIS — N401 Enlarged prostate with lower urinary tract symptoms: Secondary | ICD-10-CM | POA: Diagnosis not present

## 2018-05-20 DIAGNOSIS — R338 Other retention of urine: Secondary | ICD-10-CM | POA: Diagnosis not present

## 2018-05-20 IMAGING — CR DG CHEST 2V
2 series · 2 of 2 positions shown · non-contrast
Comparison: 12/01/2016

CLINICAL DATA: Fever, chills and weakness since yesterday.

EXAM:
CHEST  2 VIEW

[w chest pa]
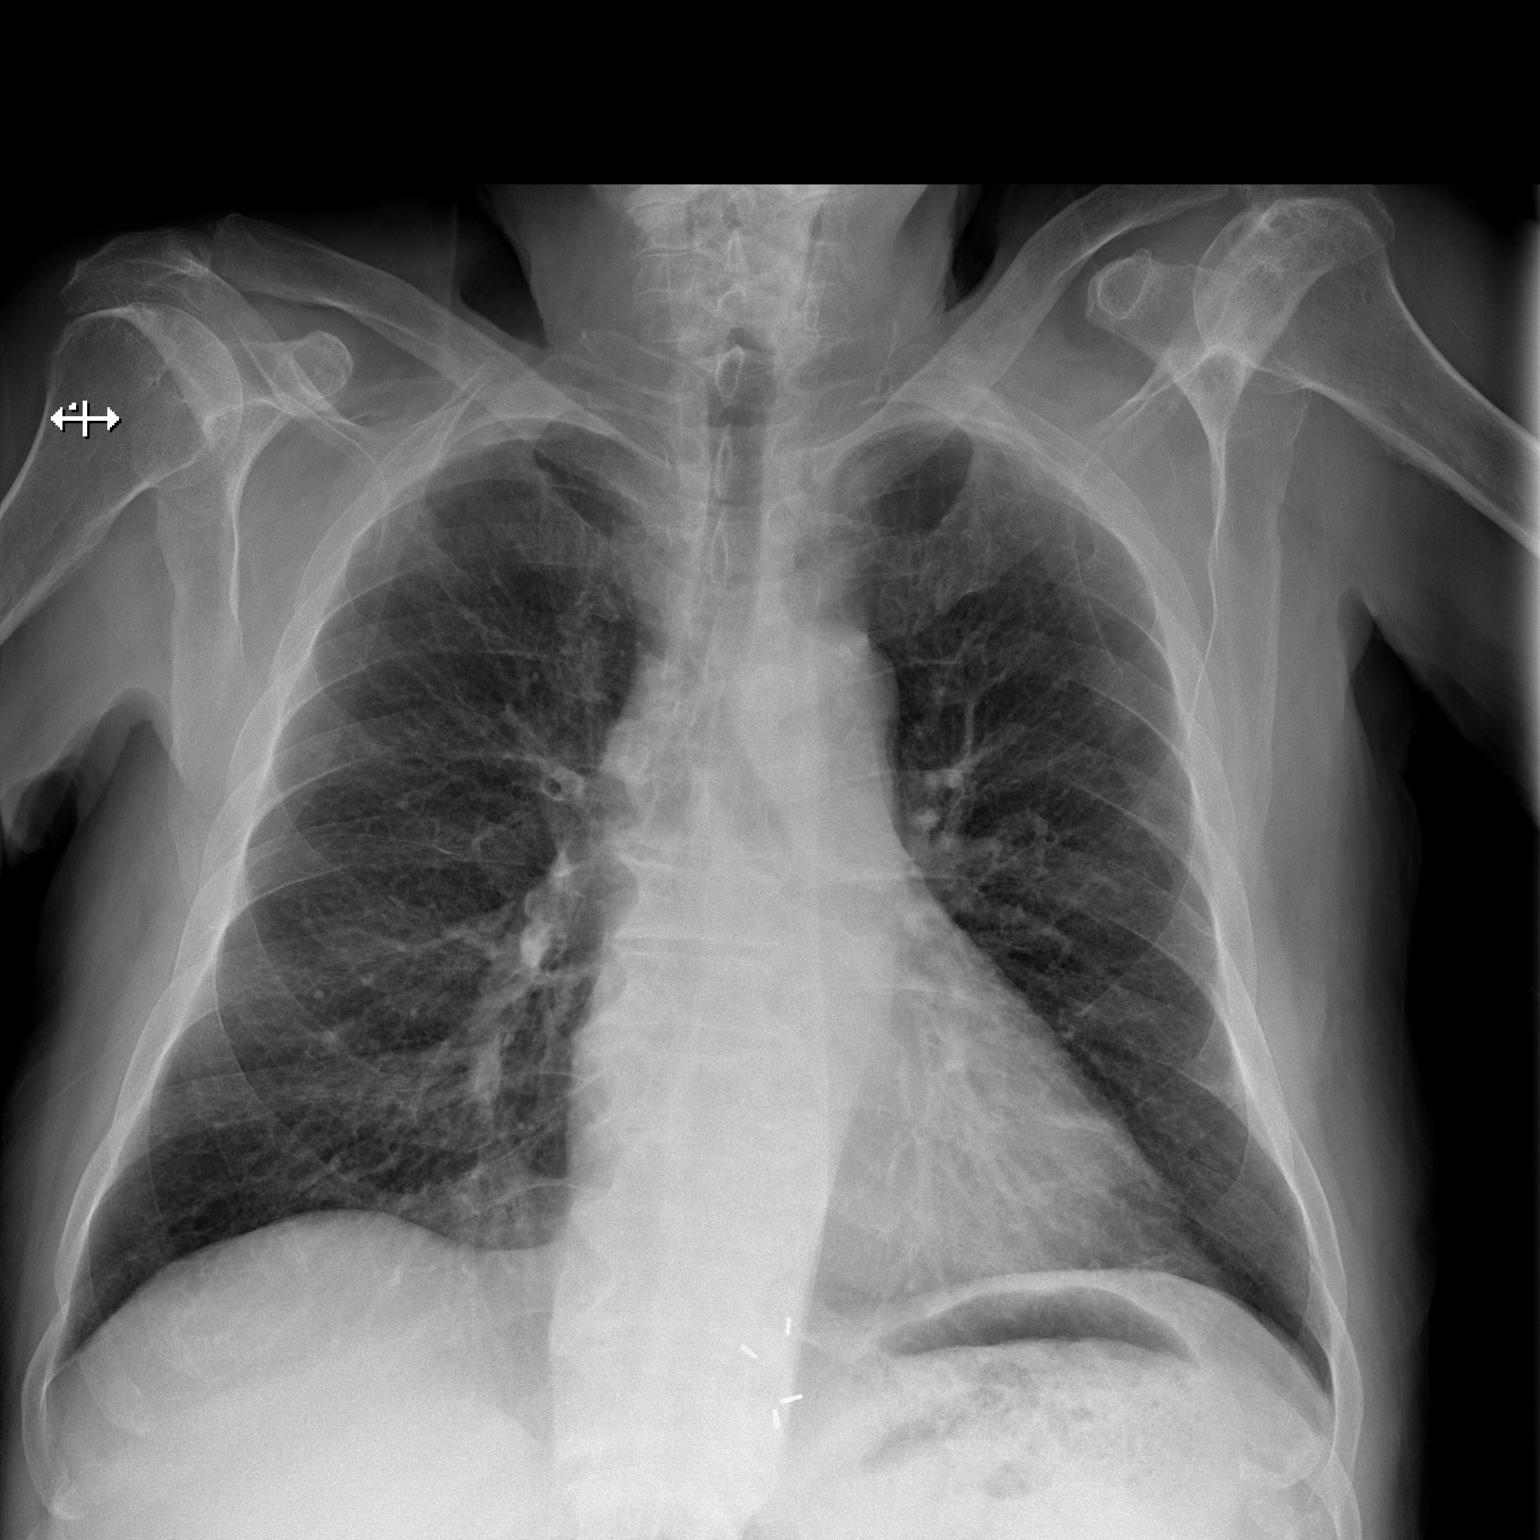

[w chest lat]
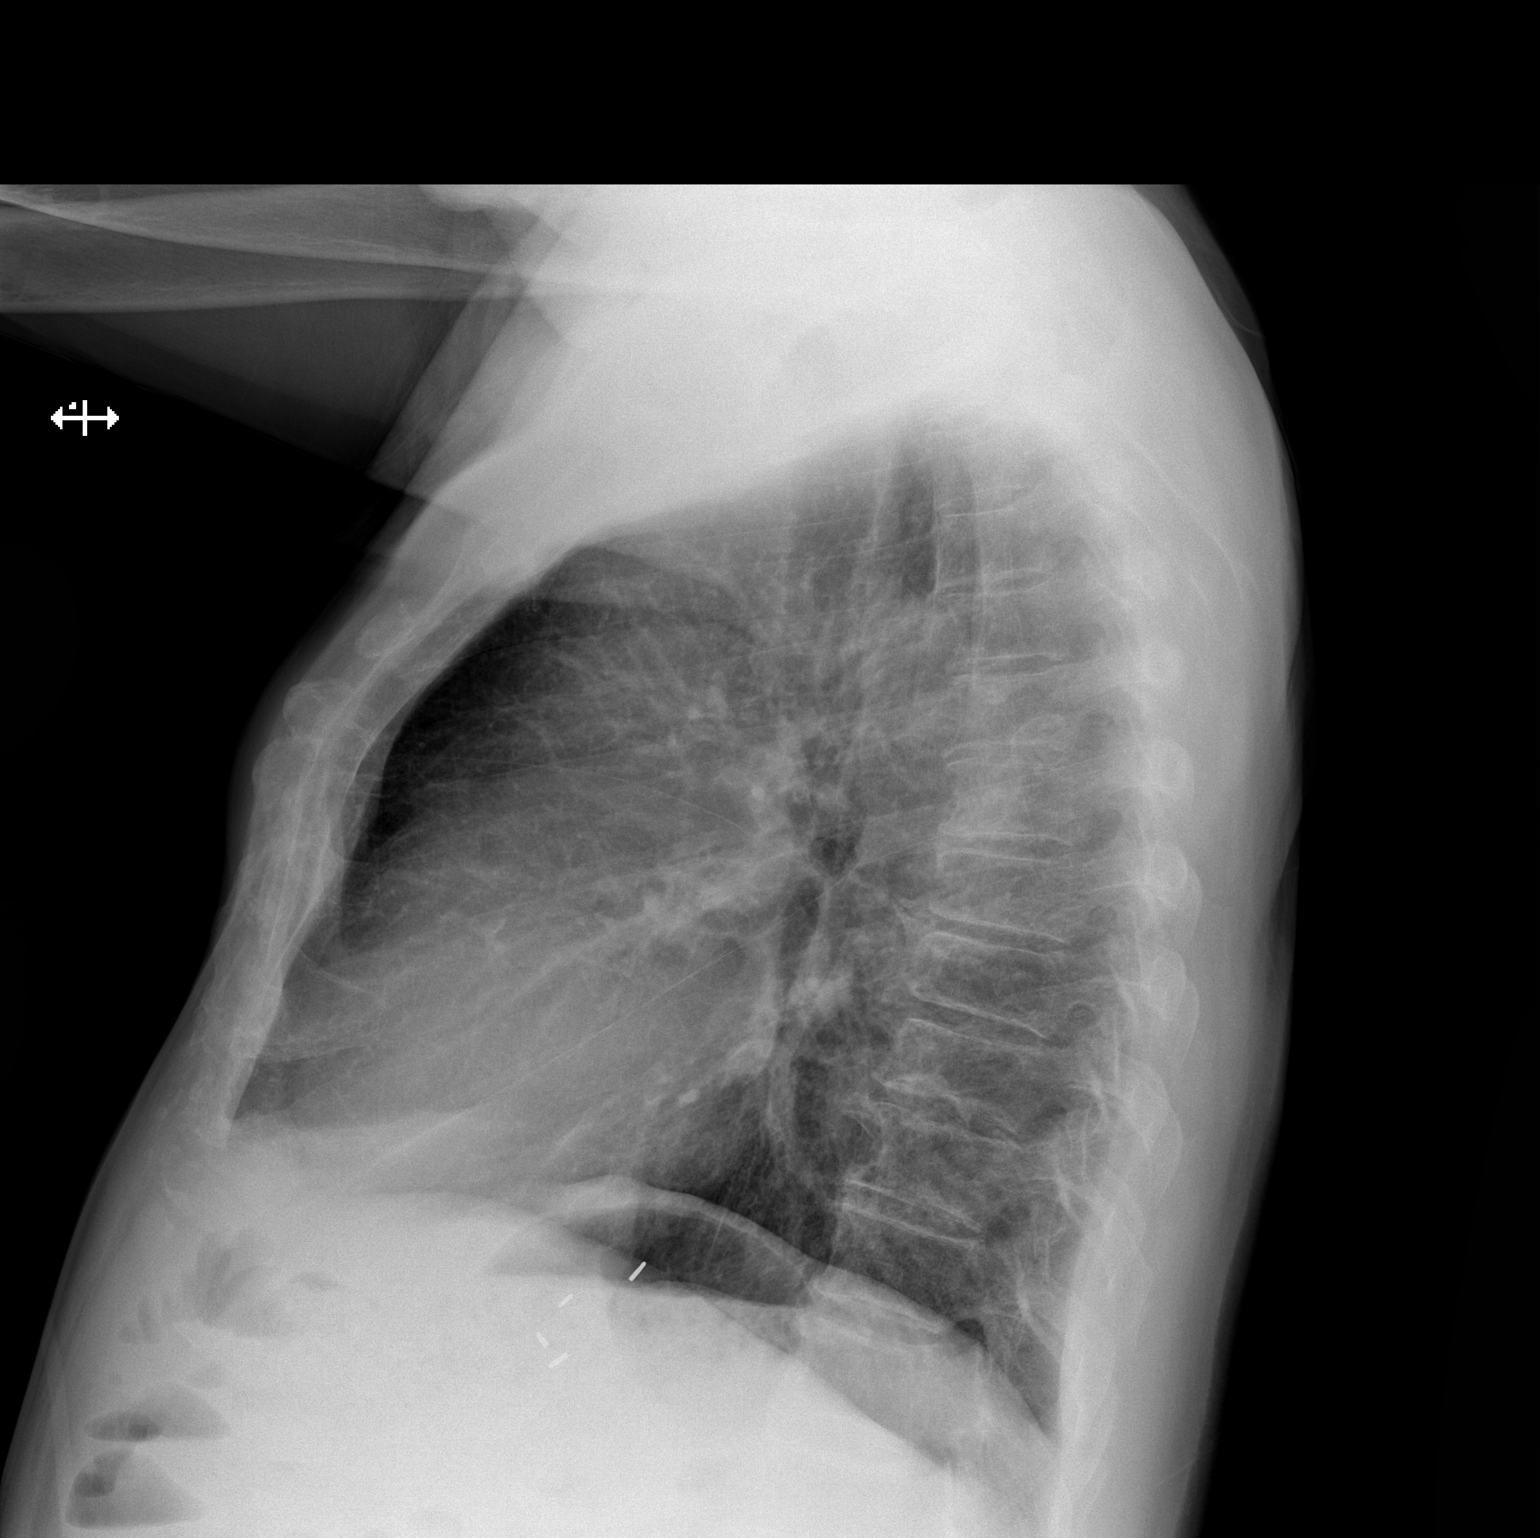

[2 of 2 positions shown; findings below may reference images not displayed]

FINDINGS: The cardiac silhouette, mediastinal and hilar contours are within
normal limits and stable. Mild chronic bronchitic changes but no
infiltrates, edema or effusions. No worrisome pulmonary lesions. The
bony thorax is intact.
IMPRESSION: No acute cardiopulmonary findings.

## 2018-05-20 IMAGING — CT CT ABD-PELV W/ CM
2 of 5 series · 15 of 46 positions shown, 17 images · IV contrast (ISOVUE)
Comparison: October 01, 2016

CLINICAL DATA: Nausea and vomiting

EXAM:
CT ABDOMEN AND PELVIS WITH CONTRAST
TECHNIQUE: Multidetector CT imaging of the abdomen and pelvis was performed
using the standard protocol following bolus administration of
intravenous contrast.
CONTRAST:  100 mL [id number] nonionic

[Series 2: axial st · axial · 0.76mm/px · z∈[+820,+1200]mm · 12 of 88 slices shown, 14 images]
[im 6/88  soft-tissue]
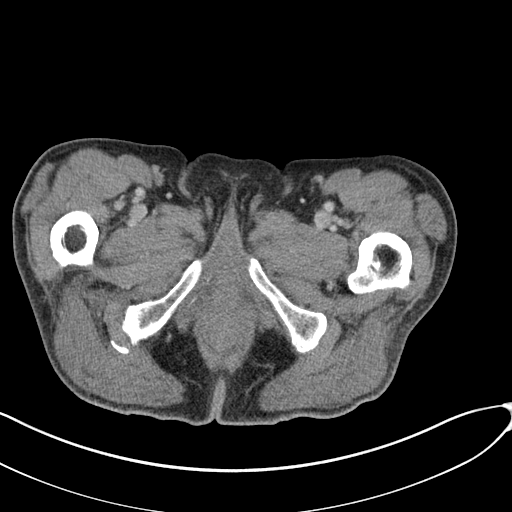
[im 6/88  bone]
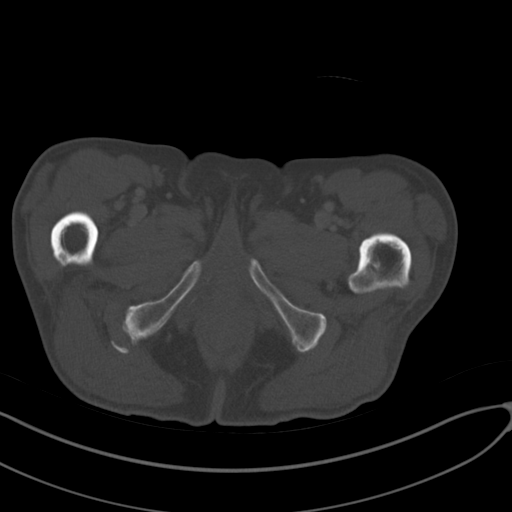
[im 11/88  soft-tissue]
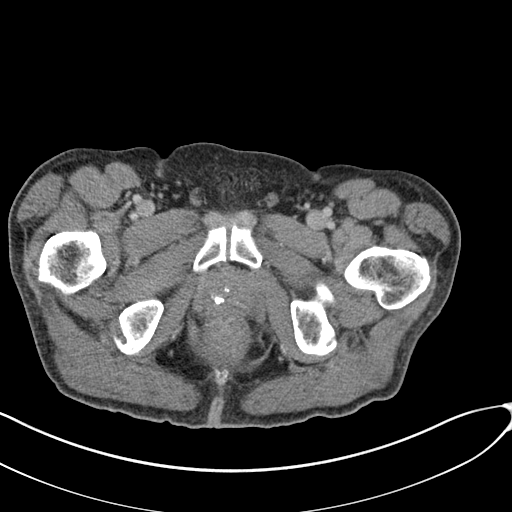
[im 22/88  soft-tissue]
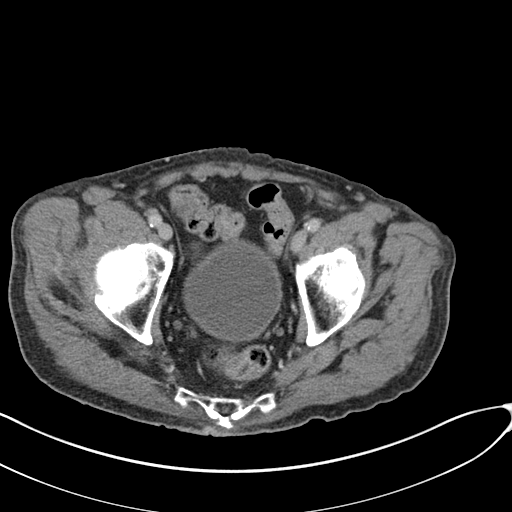
[im 28/88  soft-tissue]
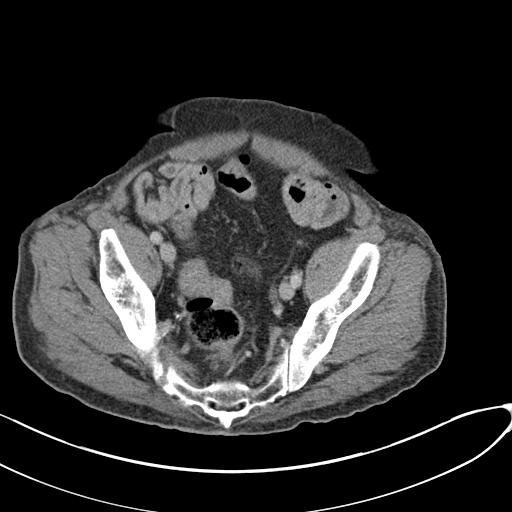
[im 33/88  soft-tissue]
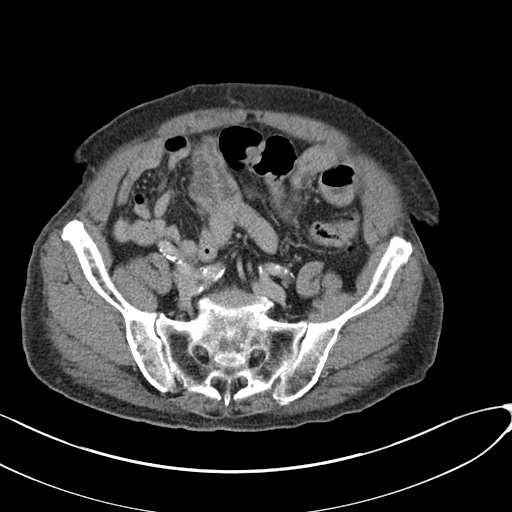
[im 39/88  soft-tissue]
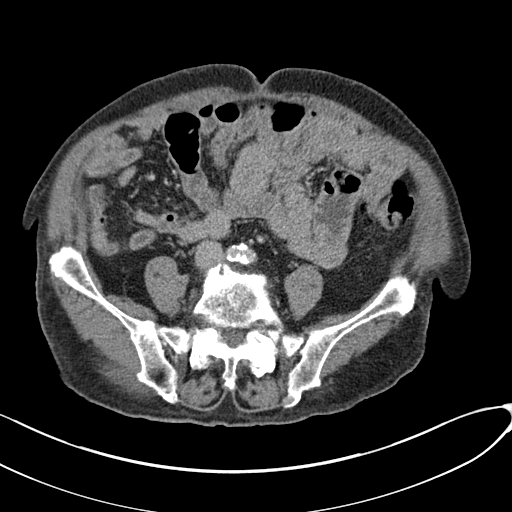
[im 49/88  soft-tissue]
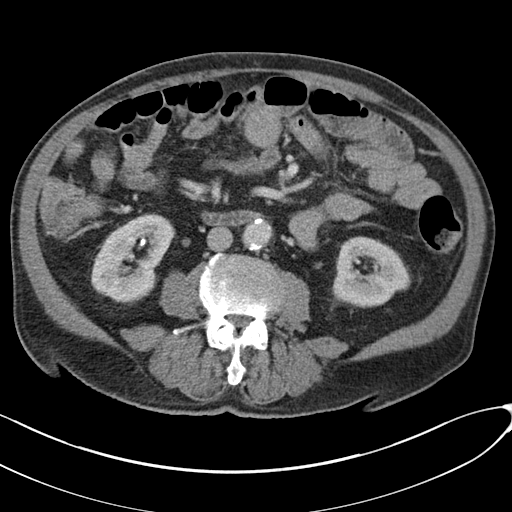
[im 55/88  soft-tissue]
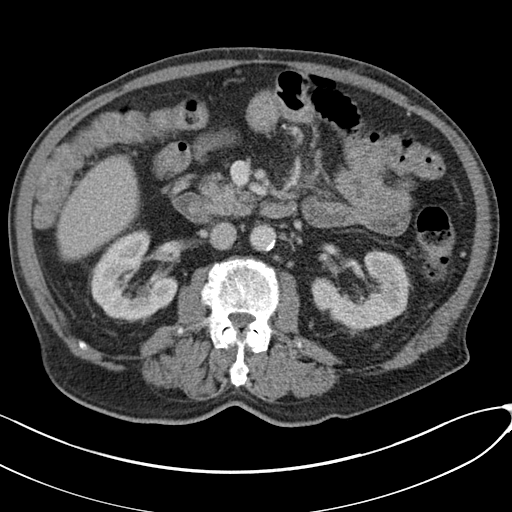
[im 60/88  soft-tissue]
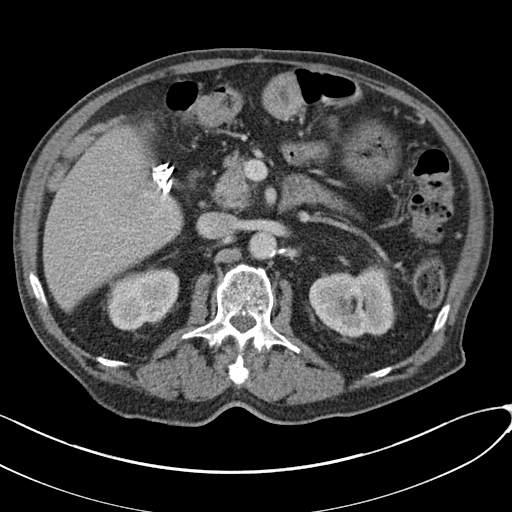
[im 60/88  bone]
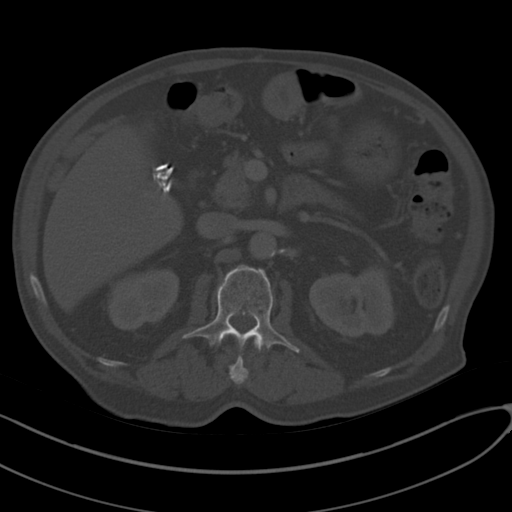
[im 66/88  soft-tissue]
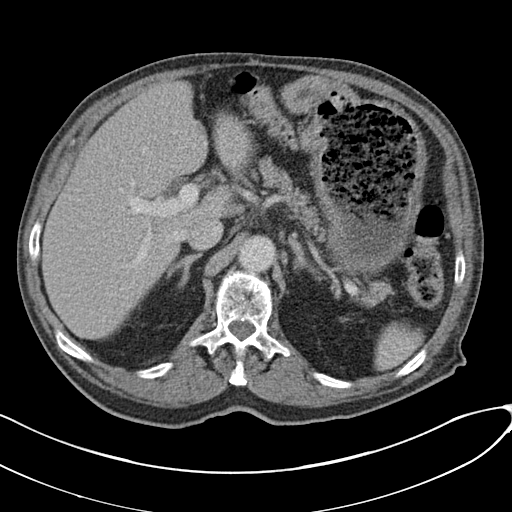
[im 77/88  soft-tissue]
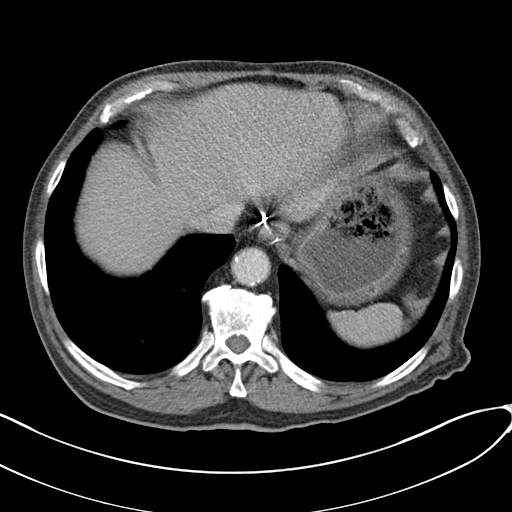
[im 82/88  soft-tissue]
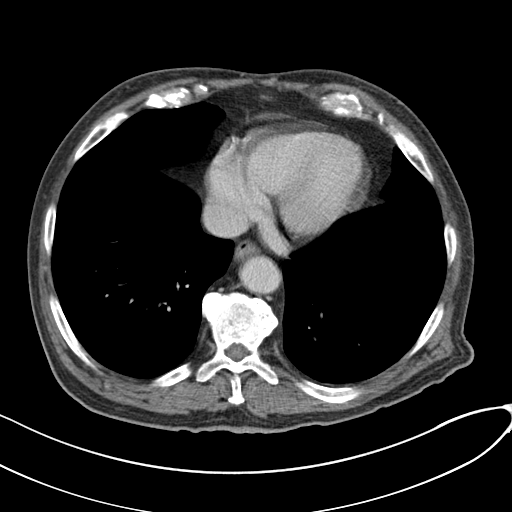

[Series 4: coronal st · coronal · 0.76mm/px · 3 of 97 slices shown]
[im 33/97  soft-tissue]
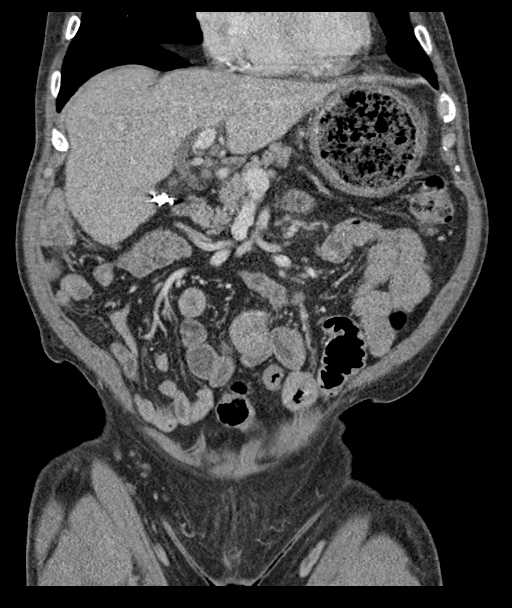
[im 43/97  soft-tissue]
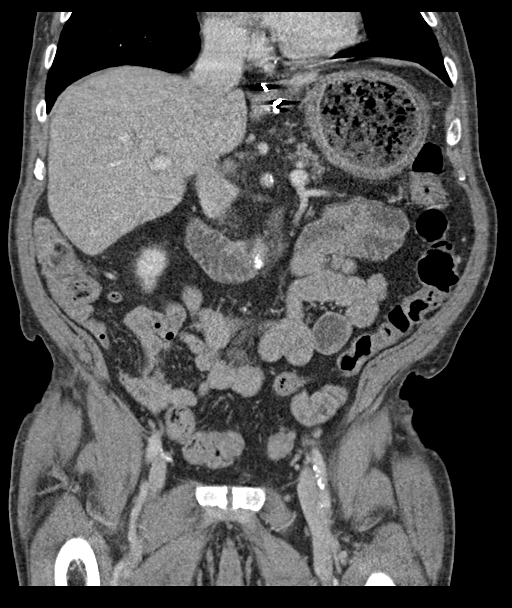
[im 54/97  soft-tissue]
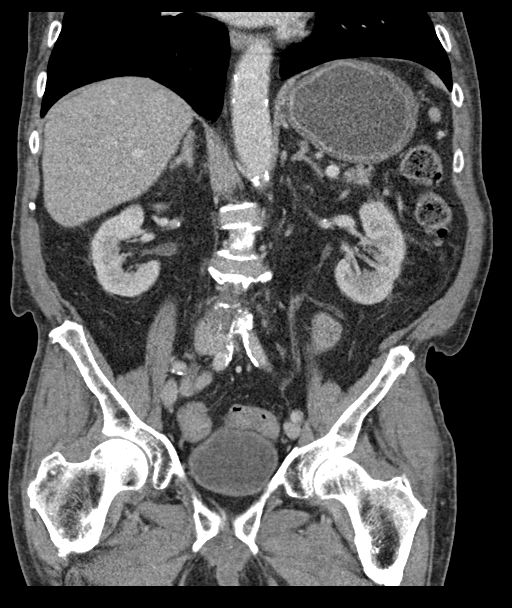

[15 of 46 positions shown; findings below may reference images not displayed]

FINDINGS: Lower chest: Lung bases are clear. There is mild lower lobe
bronchiectatic change bilaterally. There are foci of coronary artery
calcification.

Hepatobiliary: No focal liver lesions are apparent. Gallbladder is
absent. There is no appreciable biliary duct dilatation.

Pancreas:  There is no pancreatic mass or inflammatory focus.

Spleen: No splenic lesions are evident.

Adrenals/Urinary Tract: Adrenals appear normal bilaterally. Kidneys
bilaterally show no evident mass or hydronephrosis on either side.
There is a calculus in the anterior aspect of the lower pole of the
right kidney measuring 8 x 6 mm. There is a calculus in the proximal
right ureter measuring 4 x 3 mm. This calculus is at the level of
L4. No other ureteral calculi are appreciable. Urinary bladder is
midline with wall thickness within normal limits.

Stomach/Bowel: There is postoperative change in the proximal
stomach. Remaining stomach shows mild generalized wall thickness.
Stomach is filled with food material and fluid. Elsewhere, there is
no appreciable bowel wall or mesenteric thickening. No evident bowel
obstruction. No free air or portal venous air evident.

Vascular/Lymphatic: There is atherosclerotic calcification in the
aorta and common iliac arteries. There is slight widening of the
distal abdominal aorta with a maximum transverse diameter 2.7 x
cm. There are foci of calcification in proximal major mesenteric
arterial vessels. No mesenteric vascular occlusion is evident. There
is no appreciable adenopathy in the abdomen or pelvis.

Reproductive: Prostate and seminal vesicles appear normal in size
and contour. There are multiple prostatic calculi.

Other: Appendix appears normal. There is no ascites or abscess in
the abdomen or pelvis.

Musculoskeletal: There is degenerative change in the lumbar spine.
There is mild-to-moderate spinal stenosis at L2-3 and L3-4 due to
bony hypertrophy and disc bulging. There are no blastic or lytic
bone lesions. There is a small bone island in the posterior left
iliac crest. There is no intramuscular or abdominal wall lesion.
IMPRESSION: 1. There is a 4 x 3 mm calculus in the proximal right ureter at the
level of L4 without appreciable hydronephrosis. Within the right
kidney, there is an 8 x 6 mm calculus in the lower pole anteriorly.

2. Postoperative change in stomach. Anastomosis patent. There is
wall thickening in the stomach suggesting a degree of gastritis.
Moderate fluid and food material are present in the stomach.
Question degree of gastric emptying.

3.  No bowel obstruction.  No abscess.  Appendix appears normal.

4. There is aortoiliac atherosclerosis. There is slight dilatation
of the distal abdominal aorta to 2.7 x 2.6 cm. Ectatic abdominal
aorta at risk for aneurysm development. Recommend followup by
ultrasound in 5 years. This recommendation follows ACR consensus
guidelines: White Paper of the ACR Incidental Findings Committee II
on Vascular Findings. [HOSPITAL] 8927; [DATE].

5.  Multiple prostatic calculi evident.

6. Mild-to-moderate spinal stenosis at L2-3 and L3-4,
multifactorial.

7.  Absent gallbladder.

8.  There is a degree of bronchiectatic change in both lower lobes.

Aortic Atherosclerosis (CE27Q-H9Q.Q).

## 2018-05-20 MED ORDER — CITALOPRAM HYDROBROMIDE 20 MG PO TABS: 20.0000 mg | ORAL_TABLET | Freq: Every day | ORAL | 1 refills | Status: DC

## 2018-05-20 NOTE — Telephone Encounter (Signed)
Pt pharmacy is requesting refill for DILTIAZEM 360 mg qd. Dose was changed in the hospital. Is Dr. Curt Bears willing to refill this dose for this medication? Thank you.

## 2018-05-20 NOTE — Progress Notes (Signed)
Established Patient Office Visit  Subjective:  Patient ID: [name], male    DOB: [birth year]  Age: 78 y.o. MRN: [id number]  CC:  Chief Complaint  Patient presents with  . Follow-up    ED 2/2: suicidal ideations, UTI. ED 2/3: generalized weakness    HPI [name] presents for hospital follow-up and referral psychiatry. Accompanied by his daughter today. Suicidal ideation  Patient was hospitalized at [facility] 04/14/2018 for major depressive disorder with suicidal ideations. During admission for [facility] patient developed severe urinary retention and required insertion of foley catheters at [facility]. He continued to have foley up until one week ago. He was seen last week by [facility]. Foley was discontinued. Patient was able to void while in office, however, today complains of an inability to empty his bladder for greater than 5 days. He feels bloated and has abdominal pain. He has an appointment with urology immediately follow-up office today. Patient suffers from chronic BPH. He reports since [facility], feeling some improvement with new medication. He is currently prescribed Celexa. Endorses problems with memory and and inability to focus. He has also experienced generalized weakness over the past month, however upon evaluation at the ER on 04/29/18, patient was found to have a UTI. He denies fever, chills, chest pain, shortness of breath, or dizziness.  Past Medical History:  Diagnosis Date  . Allergic rhinitis   . Anemia    takes Ferrous Sulfate daily  . Arthritis    "all over"  . Atrial flutter ([place])   . Balance problem 01/2014  . CAP (community acquired pneumonia) 09/18/2014  . Cervical radiculopathy due to degenerative joint disease of spine   . Chronic coronary artery disease    a. 03/2010 Nonocclusive disease by cath, performed for ST elevations on ECG;  b. 06/2013 Lexi MV: EF 60%, no  ischemia.  [name] COPD (chronic obstructive pulmonary disease) ([place])   . Diabetes mellitus type II    takes Metformin and Lantus daily  . Diastolic CHF, chronic ([place])    a. 12/2012 EF 55-60%, diast dysfxn, triv MR, mildly dil LA/RA.  [name] Essential hypertension    takes Diltiazem daily  . Frail elderly   . GERD (gastroesophageal reflux disease)   . History of blood transfusion 1982   "when I had stomach OR"  . History of bronchitis    1998  . History of CVA (cerebrovascular accident)   . History of gastric ulcer   . Hypercholesterolemia    takes Pravastatin daily  . Intrinsic eczema   . Legally blind in left eye, as defined in [place]   . Obesity   . PAF (paroxysmal atrial fibrillation) (HCC)    Recurrent after atrial flutter (a. 07/2010 Status post caval tricuspid isthmus ablation by Dr. Midge Aver Metoprolol daily), currently controlled on flecainide plus diltiazem and Coumadin  . Vitamin D deficiency   . Weakness    numbness and tingling both hands    Past Surgical History:  Procedure Laterality Date  . ATRIAL ABLATION SURGERY  08/05/10   CTI ablation for atrial flutter by JA  . CARDIAC CATHETERIZATION  2012   nl LV function, no occlusive CAD, PAF  . CARDIOVERSION  12/07/2010    Successful direct current cardioversion with atrial fibrillation to normal sinus rhythm  . CARPAL TUNNEL RELEASE Bilateral 01/30/2014   Procedure: BILATERAL CARPAL TUNNEL RELEASE;  Surgeon: Marianna Payment, MD;  Location: [facility];  Service: Orthopedics;  Laterality: Bilateral;  . CATARACT EXTRACTION W/ INTRAOCULAR LENS  IMPLANT, BILATERAL Bilateral   . COLONOSCOPY [name] 12/02/2013   Procedure: COLONOSCOPY;  Surgeon: Irene Shipper, MD;  Location: [facility];  Service: Endoscopy;  Laterality: [name];  . ESOPHAGOGASTRODUODENOSCOPY [name] 09/22/2014   Procedure: ESOPHAGOGASTRODUODENOSCOPY (EGD);  Surgeon: Ronald Lobo, MD;  Location: [facility] ENDOSCOPY;  Service: Endoscopy;  Laterality: [name];  . ESOPHAGOGASTRODUODENOSCOPY [name]  07/11/2016   Procedure: ESOPHAGOGASTRODUODENOSCOPY (EGD);  Surgeon: Doran Stabler, MD;  Location[facility] END[facility]COPY;  Service: Endoscopy;  Laterality: [name];  . INCISION AND DRAINAGE ABSCESS / HEMATOMA OF BURSA / KNEE / THIGH Left 1998   knee  . KNEE BURSECTOMY Left 1998  . LAPAROSCOPIC CHOLECYSTECTOMY  03/2010  . LEFT HEART CATH AND CORONARY ANGIOGRAPHY [name] 10/05/2017   Procedure: LEFT HEART CATH AND CORONARY ANGIOGRAPHY;  Surgeon: Martinique, Peter M, MD;  Location: [facility];  Service: Cardiovascular;  Laterality: [name];  . NM MYOVIEW LTD  07/22/2013   Normal EF ~60%, no ischemia or infarction.  [name] PARTIAL GASTRECTOMY  1982   subtotal; "took out 30% for ulcers"  . TRANSTHORACIC ECHOCARDIOGRAM  02/16/2014   EF 60%, no RWMA. - otherwise normal  . YAG LASER APPLICATION Bilateral     Family History  Problem Relation Age of Onset  . Breast cancer Mother   . Diabetes Mother   . Kidney disease Mother   . Heart attack Father   . Heart disease Father   . Heart attack Brother   . Leukemia Daughter     Social History   Socioeconomic History  . Marital status: Widowed    Spouse name: Not on file  . Number of children: Not on file  . Years of education: Not on file  . Highest education level: Not on file  Occupational History  . Occupation: Retired  Scientific laboratory technician  . Financial resource strain: Not on file  . Food insecurity:    Worry: Not on file    Inability: Not on file  . Transportation needs:    Medical: Not on file    Non-medical: Not on file  Tobacco Use  . Smoking status: Current Every Day Smoker    Packs/day: 0.50    Years: 57.00    Pack years: 28.50    Types: Cigarettes  . Smokeless tobacco: Never Used  . Tobacco comment: He's been smoking between 0.5 and 2 ppd since age 33.  Substance and Sexual Activity  . Alcohol use: No    Alcohol/week: 0.0 standard drinks    Comment: "quit drinking in 1986"  . Drug use: No  . Sexual activity: Never  Lifestyle  . Physical  activity:    Days per week: Not on file    Minutes per session: Not on file  . Stress: Not on file  Relationships  . Social connections:    Talks on phone: Not on file    Gets together: Not on file    Attends religious service: Not on file    Active member of club or organization: Not on file    Attends meetings of clubs or organizations: Not on file    Relationship status: Not on file  . Intimate partner violence:    Fear of current or ex partner: Not on file    Emotionally abused: Not on file    Physically abused: Not on file    Forced sexual activity: Not on file  Other Topics Concern  . Not on file  Social History  Narrative   He is a widower, who moved to [place] in 2011 to live closer to his daughter. He formerly lived in [place]. He is a father of 67, grandfather, and great-grandfather of 52. Does not drink alcohol, context of smoker.    Outpatient Medications Prior to Visit  Medication Sig Dispense Refill  . albuterol (PROVENTIL HFA;VENTOLIN HFA) 108 (90 Base) MCG/ACT inhaler Inhale 2 puffs into the lungs every 4 (four) hours as needed for wheezing or shortness of breath. 1 Inhaler 0  . aspirin EC 81 MG tablet Take 81 mg by mouth daily.    . citalopram (CELEXA) 20 MG tablet Take 1 tablet by mouth daily.    [name] co-enzyme Q-10 30 MG capsule Take 30 mg by mouth daily.     . diclofenac sodium (VOLTAREN) 1 % GEL Apply 4 g topically 3 (three) times daily as needed (for back pain). (Patient taking differently: Apply 4 g topically 2 (two) times daily as needed (for back pain). ) 1 Tube 0  . diltiazem (CARDIZEM CD) 360 MG 24 hr capsule Take 1 capsule by mouth daily.    . ferrous sulfate 325 (65 FE) MG tablet TAKE 1 TABLET BY MOUTH EVERY DAY 90 tablet 3  . glucose blood (PRECISION QID TEST) test strip Check glucose twice daily    . magnesium hydroxide (DULCOLAX MILK OF MAGNESIA) 400 MG/5ML suspension Take 5 mLs by mouth daily as needed for mild constipation.    . Melatonin 3 MG  TABS Take 1 tablet (3 mg total) by mouth at bedtime. 30 tablet 0  . nitroGLYCERIN (NITROSTAT) 0.4 MG SL tablet Place 1 tablet (0.4 mg total) under the tongue every 5 (five) minutes x 3 doses as needed for chest pain. 25 tablet 9  . pantoprazole (PROTONIX) 40 MG tablet Take 1 tablet (40 mg total) by mouth 2 (two) times daily before a meal.    . pravastatin (PRAVACHOL) 40 MG tablet Take 1 tablet (40 mg total) by mouth every evening. 90 tablet 3  . tamsulosin (FLOMAX) 0.4 MG CAPS capsule Take 0.4 mg by mouth 2 (two) times daily.    . vitamin C (ASCORBIC ACID) 250 MG tablet Take 1 tablet (250 mg total) by mouth daily. 30 tablet 2  . potassium chloride (K-DUR) 10 MEQ tablet Take 2 tablets (20 mEq total) by mouth daily for 2 days. 4 tablet 0  . cephALEXin (KEFLEX) 500 MG capsule Take 1 capsule (500 mg total) by mouth 2 (two) times daily. 10 capsule 0  . clotrimazole-betamethasone (LOTRISONE) cream Apply 1 application topically 2 (two) times daily. 30 g 0  . diltiazem (CARDIZEM CD) 300 MG 24 hr capsule Take 1 capsule (300 mg total) by mouth daily. 30 capsule 6  . diltiazem (CARDIZEM) 30 MG tablet TAKE 1 TABLET BY MOUTH 4 TIMES DAILY AS NEEDED FOR AFIB (Patient taking differently: Take 30 mg by mouth 4 (four) times daily as needed (AFIB). ) 30 tablet 11  . hydrocortisone cream 1 % Apply to affected area 2 times daily 15 g 0  . nystatin cream (MYCOSTATIN) Apply to affected area 2 times daily (Patient not taking: Reported on 04/14/2018) 30 g 0   No facility-administered medications prior to visit.     Allergies  Allergen Reactions  . Cyclobenzaprine Other (See Comments)    Unsteady gait    ROS Review of Systems Pertinent negatives listed in HPI   Objective:    Physical Exam BP 125/62   Pulse 84  Temp (!) 97.5 F (36.4 C) (Oral)   Resp 17   Ht _0  (1.676 m)   Wt 162 lb 9.6 oz (73.8 kg)   SpO2 98%   BMI 26.24 kg/m    General appearance: alert, appear poor generalized health, no  distress Head: Normocephalic, without obvious abnormality, atraumatic Heart: Irregular rhythm, regular rate. Respiratory: Respirations even and unlabored, normal respiratory rate Abdominal: BS +, abdominal distention present, mild tenderness bilateral low quadrant, negative for rebound tenderness, negative murphy's sign Extremities: No gross deformities Skin: Skin color, texture, turgor normal. No rashes seen  Psych: Appropriate mood and affect. Neurologic: Mental status: Alert, oriented to person, place, and time, thought content appropriate.  Wt Readings from Last 3 Encounters:  05/20/18 162 lb 9.6 oz (73.8 kg)  04/28/18 145 lb (65.8 kg)  04/04/18 158 lb 9.6 oz (71.9 kg)     Health Maintenance Due  Topic Date Due  . TETANUS/TDAP  04/16/1958  . OPHTHALMOLOGY EXAM  02/03/2017  . HEMOGLOBIN A1C  05/11/2018    There are no preventive care reminders to display for this patient.  Lab Results  Component Value Date   TSH 5.100 (H) 12/11/2017   Lab Results  Component Value Date   WBC 7.9 04/29/2018   HGB 10.9 (L) 04/29/2018   HCT 36.4 (L) 04/29/2018   MCV 93.1 04/29/2018   PLT 377 04/29/2018   Lab Results  Component Value Date   NA 139 04/29/2018   K 3.9 04/29/2018   CHLORIDE 103 10/20/2016   CO2 24 04/29/2018   GLUCOSE 133 (H) 04/29/2018   BUN 12 04/29/2018   CREATININE 1.05 04/29/2018   BILITOT 0.5 04/29/2018   ALKPHOS 59 04/29/2018   AST 27 04/29/2018   ALT 29 04/29/2018   PROT 6.3 (L) 04/29/2018   ALBUMIN 3.0 (L) 04/29/2018   CALCIUM 9.0 04/29/2018   ANIONGAP 9 04/29/2018   EGFR 86 (L) 10/20/2016   GFR 84.02 07/29/2010   Lab Results  Component Value Date   CHOL 82 11/09/2017   Lab Results  Component Value Date   HDL 38 (L) 11/09/2017   Lab Results  Component Value Date   LDLCALC 17 11/09/2017   Lab Results  Component Value Date   TRIG 136 11/09/2017   Lab Results  Component Value Date   CHOLHDL 2.2 11/09/2017   Lab Results  Component Value  Date   HGBA1C 5.6 11/08/2017      Assessment & Plan:  1. Current moderate episode of major depressive disorder, unspecified whether recurrent ([place]) -Continue Celexa - Ambulatory referral to Psychiatry,  Given nature of mental health disease with psychotic features, patient would benefit from close management by psychiatry.   2. BPH with obstruction/lower urinary tract symptoms 3. Urinary retention Patient is scheduled to follow-up with urology immediately following his appointment today for evaluation of likely reinsertion of chronic catheter.   4. Atrial fibrillation with RVR (HCC) -Chronic, on-going   Meds ordered this encounter  Medications  . citalopram (CELEXA) 20 MG tablet    Sig: Take 1 tablet (20 mg total) by mouth daily.    Dispense:  30 tablet    Refill:  1    Follow-up: Return in about 3 months (around 08/18/2018).    Molli Barrows, FNP

## 2018-05-20 NOTE — Patient Instructions (Signed)

## 2018-05-22 MED ORDER — DILTIAZEM HCL ER COATED BEADS 360 MG PO CP24: 360.0000 mg | ORAL_CAPSULE | Freq: Every day | ORAL | 2 refills | Status: DC

## 2018-06-13 IMAGING — CR DG HIP (WITH OR WITHOUT PELVIS) 2-3V*R*
3 series · 3 of 3 positions shown · non-contrast
Comparison: Radiographs 03/06/2017 abdominal CT 03/19/2017

CLINICAL DATA: Right hip pain for 3 days.

EXAM:
DG HIP (WITH OR WITHOUT PELVIS) 2-3V RIGHT

[x pelvis (1 of 3)]
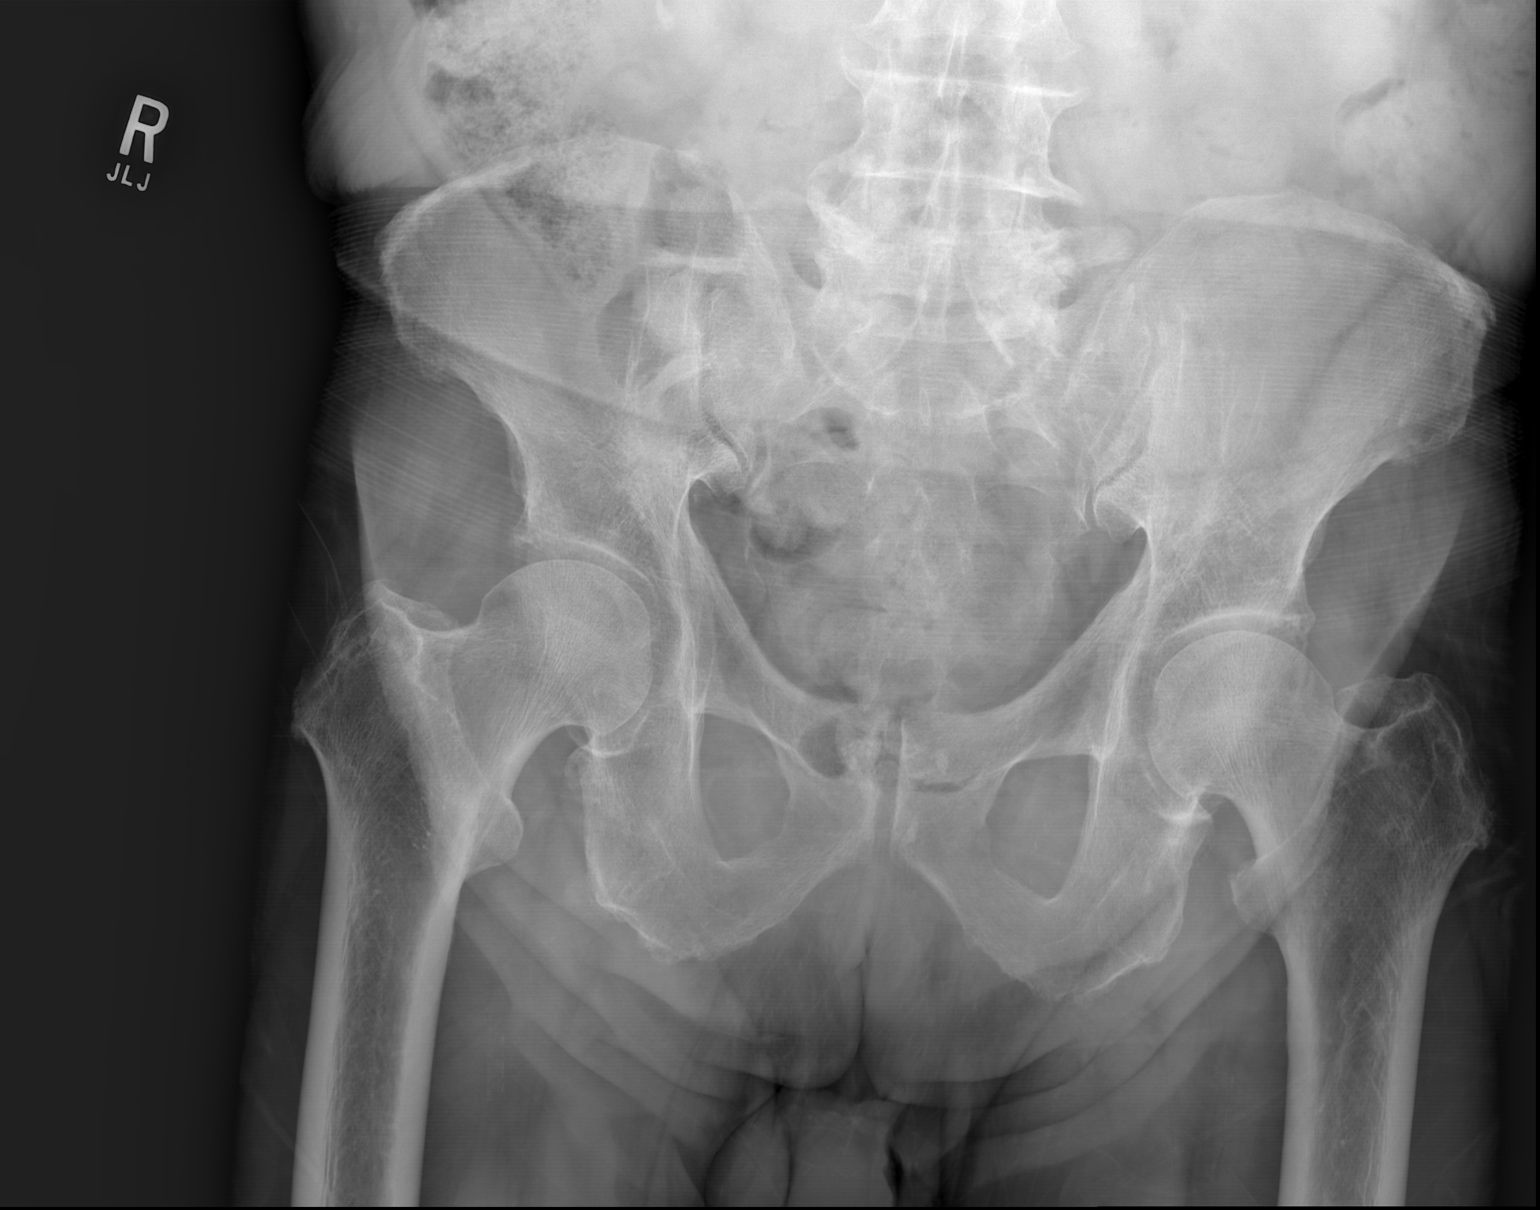

[x pelvis (2 of 3)]
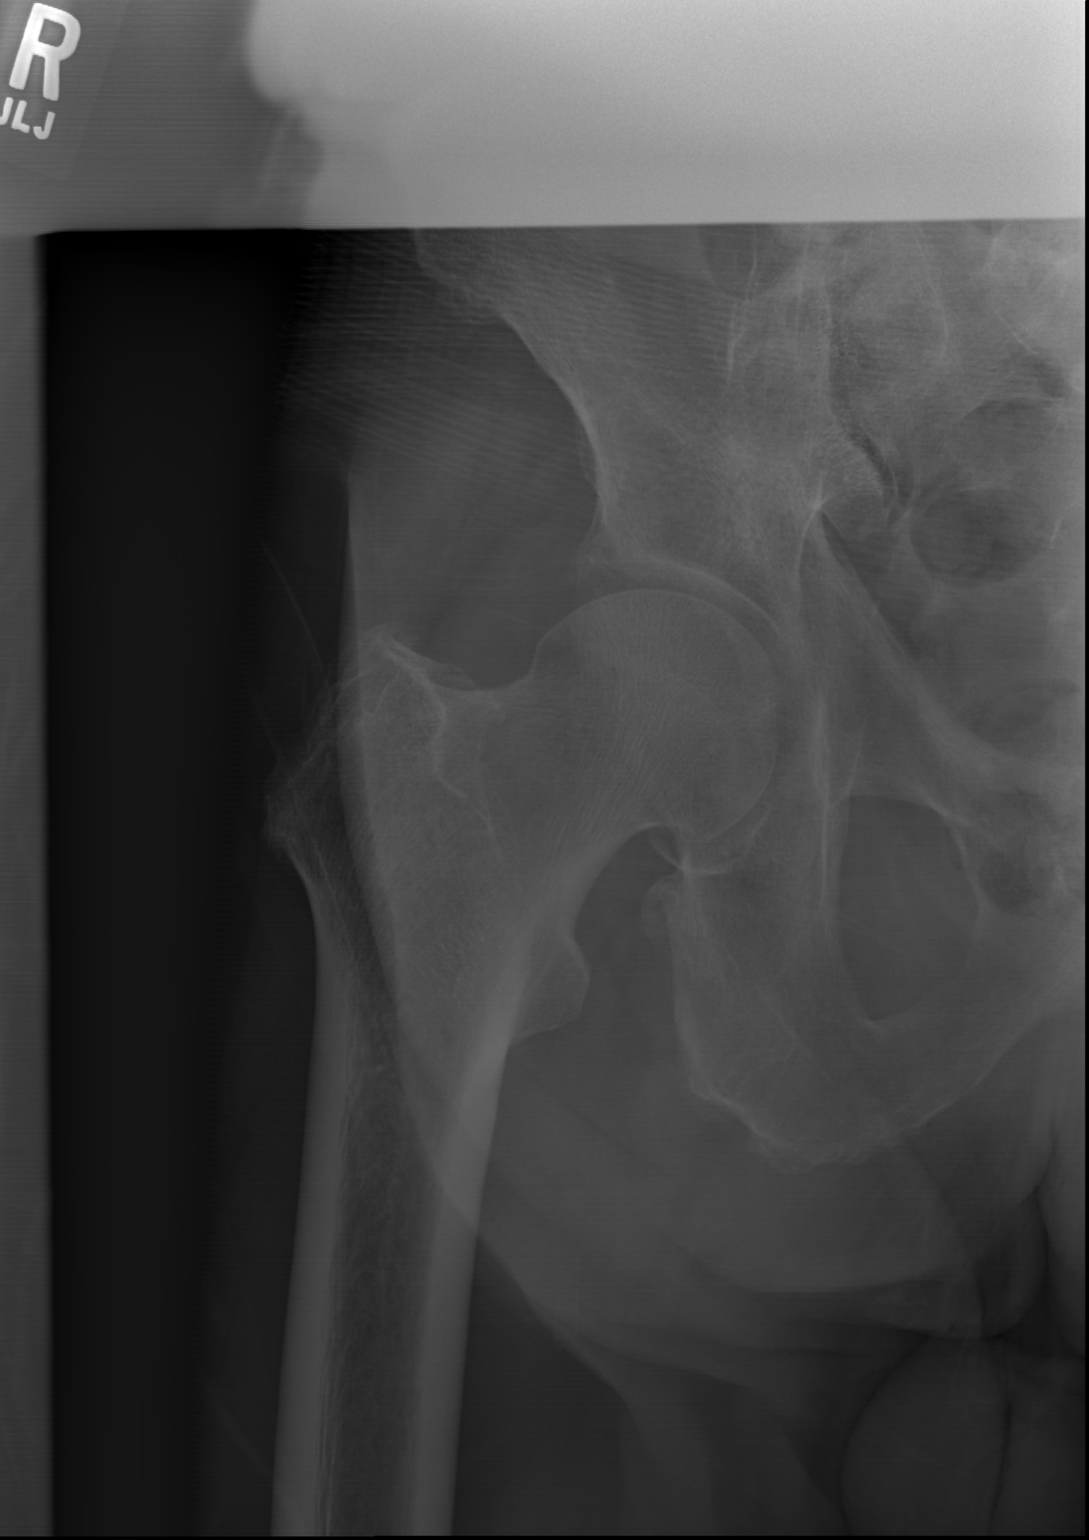

[x pelvis (3 of 3)]
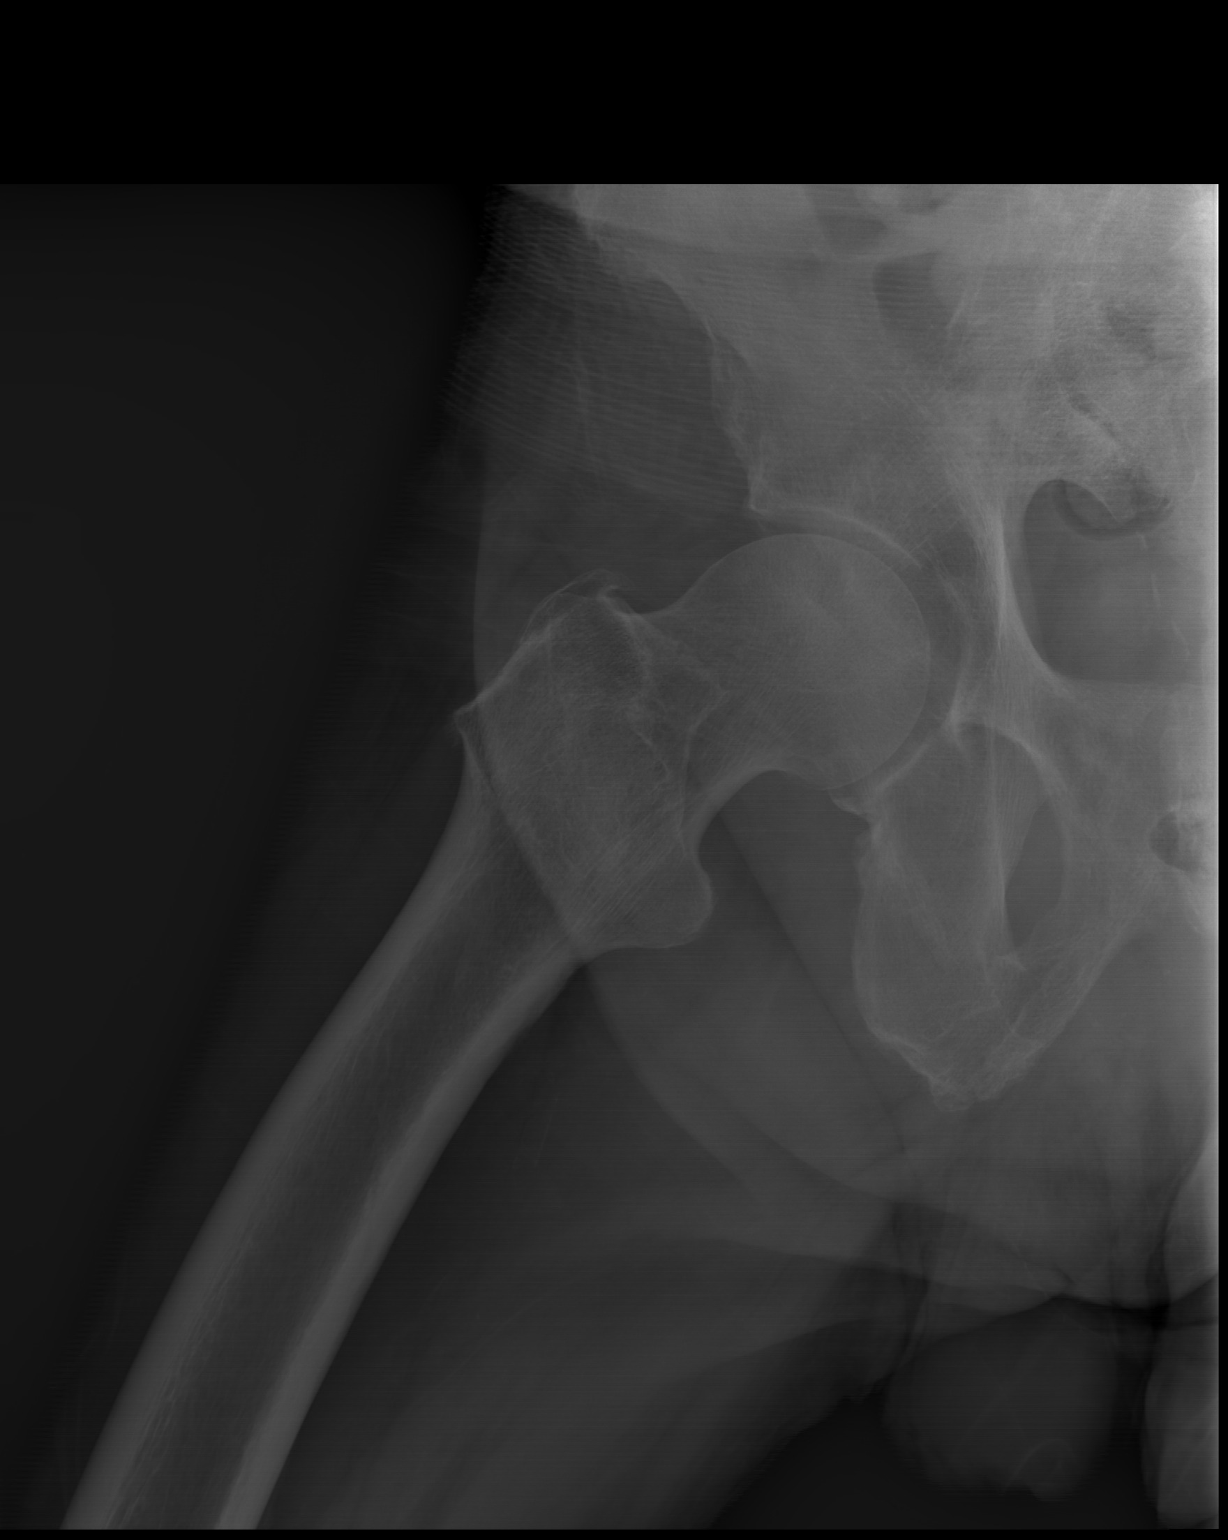

[3 of 3 positions shown; findings below may reference images not displayed]

FINDINGS: The cortical margins of the bony pelvis and right hip are intact. No
fracture. Pubic symphysis and sacroiliac joints are congruent. Both
femoral heads are well-seated in the respective acetabula. Age
related acetabular spurring.
IMPRESSION: Mild age-related osteoarthritis.  No acute osseous abnormality.

## 2018-06-17 ENCOUNTER — Other Ambulatory Visit: Payer: Self-pay | Admitting: Family Medicine

## 2018-06-18 DIAGNOSIS — R338 Other retention of urine: Secondary | ICD-10-CM | POA: Diagnosis not present

## 2018-07-10 ENCOUNTER — Emergency Department (HOSPITAL_COMMUNITY): Payer: Medicare Other

## 2018-07-10 ENCOUNTER — Encounter (HOSPITAL_COMMUNITY): Payer: Self-pay | Admitting: Emergency Medicine

## 2018-07-10 ENCOUNTER — Other Ambulatory Visit: Payer: Self-pay

## 2018-07-10 ENCOUNTER — Emergency Department (HOSPITAL_COMMUNITY)
Admission: EM | Admit: 2018-07-10 | Discharge: 2018-07-10 | Disposition: A | Discharge status: Home / Self Care | Payer: Medicare Other | Attending: Emergency Medicine | Admitting: Emergency Medicine

## 2018-07-10 DIAGNOSIS — R079 Chest pain, unspecified: Secondary | ICD-10-CM | POA: Diagnosis not present

## 2018-07-10 DIAGNOSIS — I5032 Chronic diastolic (congestive) heart failure: Secondary | ICD-10-CM | POA: Insufficient documentation

## 2018-07-10 DIAGNOSIS — N23 Unspecified renal colic: Secondary | ICD-10-CM

## 2018-07-10 DIAGNOSIS — I10 Essential (primary) hypertension: Secondary | ICD-10-CM | POA: Insufficient documentation

## 2018-07-10 DIAGNOSIS — N1330 Unspecified hydronephrosis: Secondary | ICD-10-CM | POA: Diagnosis not present

## 2018-07-10 DIAGNOSIS — R319 Hematuria, unspecified: Secondary | ICD-10-CM | POA: Diagnosis not present

## 2018-07-10 DIAGNOSIS — I2510 Atherosclerotic heart disease of native coronary artery without angina pectoris: Secondary | ICD-10-CM | POA: Insufficient documentation

## 2018-07-10 DIAGNOSIS — N201 Calculus of ureter: Secondary | ICD-10-CM | POA: Insufficient documentation

## 2018-07-10 DIAGNOSIS — R0602 Shortness of breath: Secondary | ICD-10-CM | POA: Diagnosis not present

## 2018-07-10 DIAGNOSIS — R0789 Other chest pain: Secondary | ICD-10-CM | POA: Diagnosis not present

## 2018-07-10 DIAGNOSIS — Z7982 Long term (current) use of aspirin: Secondary | ICD-10-CM | POA: Diagnosis not present

## 2018-07-10 DIAGNOSIS — R000 Tachycardia, unspecified: Secondary | ICD-10-CM | POA: Diagnosis not present

## 2018-07-10 DIAGNOSIS — N390 Urinary tract infection, site not specified: Secondary | ICD-10-CM | POA: Diagnosis not present

## 2018-07-10 DIAGNOSIS — M2550 Pain in unspecified joint: Secondary | ICD-10-CM | POA: Diagnosis not present

## 2018-07-10 DIAGNOSIS — I499 Cardiac arrhythmia, unspecified: Secondary | ICD-10-CM | POA: Diagnosis not present

## 2018-07-10 DIAGNOSIS — R110 Nausea: Secondary | ICD-10-CM | POA: Diagnosis not present

## 2018-07-10 DIAGNOSIS — Z794 Long term (current) use of insulin: Secondary | ICD-10-CM | POA: Diagnosis not present

## 2018-07-10 DIAGNOSIS — F17210 Nicotine dependence, cigarettes, uncomplicated: Secondary | ICD-10-CM | POA: Diagnosis not present

## 2018-07-10 DIAGNOSIS — E119 Type 2 diabetes mellitus without complications: Secondary | ICD-10-CM | POA: Diagnosis not present

## 2018-07-10 DIAGNOSIS — R10816 Epigastric abdominal tenderness: Secondary | ICD-10-CM | POA: Diagnosis not present

## 2018-07-10 DIAGNOSIS — N132 Hydronephrosis with renal and ureteral calculous obstruction: Secondary | ICD-10-CM | POA: Diagnosis not present

## 2018-07-10 DIAGNOSIS — J449 Chronic obstructive pulmonary disease, unspecified: Secondary | ICD-10-CM | POA: Diagnosis not present

## 2018-07-10 DIAGNOSIS — Z79899 Other long term (current) drug therapy: Secondary | ICD-10-CM | POA: Insufficient documentation

## 2018-07-10 DIAGNOSIS — Z7401 Bed confinement status: Secondary | ICD-10-CM | POA: Diagnosis not present

## 2018-07-10 DIAGNOSIS — R1032 Left lower quadrant pain: Secondary | ICD-10-CM | POA: Diagnosis present

## 2018-07-10 DIAGNOSIS — I4891 Unspecified atrial fibrillation: Secondary | ICD-10-CM | POA: Diagnosis not present

## 2018-07-10 LAB — BASIC METABOLIC PANEL
Anion gap: 13 (ref 5–15)
BUN: 7 mg/dL — ABNORMAL LOW (ref 8–23)
CO2: 20 mmol/L — ABNORMAL LOW (ref 22–32)
Calcium: 9.1 mg/dL (ref 8.9–10.3)
Chloride: 106 mmol/L (ref 98–111)
Creatinine, Ser: 1.06 mg/dL (ref 0.61–1.24)
GFR calc Af Amer: >60 mL/min (ref >60)
GFR calc non Af Amer: >60 mL/min (ref >60)
Glucose, Bld: 165 mg/dL — ABNORMAL HIGH (ref 70–99)
Potassium: 3.4 mmol/L — ABNORMAL LOW (ref 3.5–5.1)
Sodium: 139 mmol/L (ref 135–145)

## 2018-07-10 LAB — URINALYSIS, ROUTINE W REFLEX MICROSCOPIC
Bilirubin Urine: NEGATIVE
Glucose, UA: NEGATIVE mg/dL
Ketones, ur: 5 mg/dL — AB
Nitrite: POSITIVE — AB
Protein, ur: 30 mg/dL — AB
Specific Gravity, Urine: 1.023 (ref 1.005–1.030)
WBC, UA: >50 WBC/hpf — ABNORMAL HIGH (ref 0–5)
pH: 6 (ref 5.0–8.0)

## 2018-07-10 LAB — HEPATIC FUNCTION PANEL
ALT: 13 U/L (ref 0–44)
AST: 15 U/L (ref 15–41)
Albumin: 3.3 g/dL — ABNORMAL LOW (ref 3.5–5.0)
Alkaline Phosphatase: 63 U/L (ref 38–126)
Bilirubin, Direct: <0.1 mg/dL (ref 0.0–0.2)
Total Bilirubin: 0.3 mg/dL (ref 0.3–1.2)
Total Protein: 6.5 g/dL (ref 6.5–8.1)

## 2018-07-10 LAB — CBC
HCT: 31.1 % — ABNORMAL LOW (ref 39.0–52.0)
Hemoglobin: 8.4 g/dL — ABNORMAL LOW (ref 13.0–17.0)
MCH: 22.7 pg — ABNORMAL LOW (ref 26.0–34.0)
MCHC: 27 g/dL — ABNORMAL LOW (ref 30.0–36.0)
MCV: 84.1 fL (ref 80.0–100.0)
Platelets: 314 10*3/uL (ref 150–400)
RBC: 3.7 MIL/uL — ABNORMAL LOW (ref 4.22–5.81)
RDW: 15.2 % (ref 11.5–15.5)
WBC: 5.1 10*3/uL (ref 4.0–10.5)
nRBC: 0 % (ref 0.0–0.2)

## 2018-07-10 LAB — LIPASE, BLOOD: Lipase: 31 U/L (ref 11–51)

## 2018-07-10 LAB — TROPONIN I: Troponin I: <0.03 ng/mL (ref <0.03)

## 2018-07-10 MED ORDER — MORPHINE SULFATE (PF) 4 MG/ML IV SOLN
4.0000 mg | Freq: Once | INTRAVENOUS | Status: AC
  Administered 2018-07-10: 4 mg via INTRAVENOUS
  Filled 2018-07-10: qty 1

## 2018-07-10 MED ORDER — SODIUM CHLORIDE 0.9% FLUSH: 3.0000 mL | Freq: Once | INTRAVENOUS | Status: DC

## 2018-07-10 MED ORDER — OXYCODONE-ACETAMINOPHEN 5-325 MG PO TABS: 1.0000 | ORAL_TABLET | ORAL | 0 refills | Status: DC | PRN

## 2018-07-10 MED ORDER — IOHEXOL 300 MG/ML  SOLN
100.0000 mL | Freq: Once | INTRAMUSCULAR | Status: AC | PRN
  Administered 2018-07-10: 19:00:00 100 mL via INTRAVENOUS

## 2018-07-10 MED ORDER — TAMSULOSIN HCL 0.4 MG PO CAPS: 0.4000 mg | ORAL_CAPSULE | Freq: Every day | ORAL | 0 refills | Status: DC

## 2018-07-10 MED ORDER — SULFAMETHOXAZOLE-TRIMETHOPRIM 800-160 MG PO TABS: 1.0000 | ORAL_TABLET | Freq: Two times a day (BID) | ORAL | 0 refills | Status: AC

## 2018-07-10 NOTE — ED Provider Notes (Signed)
[facility]ER[facility] DEPARTMENT Provider Note   CSN: [id number] Arrival date & time: 07/10/18  1749    History   Chief Complaint Chief Complaint  Patient presents with  . Chest Pain  . Shortness of Breath    HPI [name] is a 78 y.o. male.     78 year old male presents with longstanding left-sided abdominal pain that radiates in to his chest as well as to his flank.  States he has a long history of this for several months and has not been given a diagnosis.  Denies any fever or chills.  No cough or congestion.  No anginal type symptoms.  Symptoms wax and wane and nothing makes them better or worse.  Denies any rashes.  No radiation down his leg.  No pleuritic component to his symptoms.  No medications taken for this prior to arrival     Past Medical History:  Diagnosis Date  . Allergic rhinitis   . Anemia    takes Ferrous Sulfate daily  . Arthritis    "all over"  . Atrial flutter ([place])   . Balance problem 01/2014  . CAP (community acquired pneumonia) 09/18/2014  . Cervical radiculopathy due to degenerative joint disease of spine   . Chronic coronary artery disease    a. 03/2010 Nonocclusive disease by cath, performed for ST elevations on ECG;  b. 06/2013 Lexi MV: EF 60%, no ischemia.  [name] COPD (chronic obstructive pulmonary disease) ([place])   . Diabetes mellitus type II    takes Metformin and Lantus daily  . Diastolic CHF, chronic ([place])    a. 12/2012 EF 55-60%, diast dysfxn, triv MR, mildly dil LA/RA.  [name] Essential hypertension    takes Diltiazem daily  . Frail elderly   . GERD (gastroesophageal reflux disease)   . History of blood transfusion 1982   "when I had stomach OR"  . History of bronchitis    1998  . History of CVA (cerebrovascular accident)   . History of gastric ulcer   . Hypercholesterolemia    takes Pravastatin daily  . Intrinsic eczema   . Legally blind in left eye, as defined in [place]   . Obesity   . PAF (paroxysmal atrial  fibrillation) (HCC)    Recurrent after atrial flutter (a. 07/2010 Status post caval tricuspid isthmus ablation by Dr. Midge Aver Metoprolol daily), currently controlled on flecainide plus diltiazem and Coumadin  . Vitamin D deficiency   . Weakness    numbness and tingling both hands    Patient Active Problem List   Diagnosis Date Noted  . Major depressive disorder, recurrent severe without psychotic features ([place]) 04/15/2018  . Depression   . Rapid atrial fibrillation ([place]) 01/18/2018  . Atrial fibrillation with RVR ([place]) 11/08/2017  . Chest pain with moderate risk of acute coronary syndrome 10/04/2017  . HTN (hypertension) 05/21/2017  . Diabetes mellitus type II 05/21/2017  . Diastolic CHF, chronic ([place]) 05/21/2017  . COPD (chronic obstructive pulmonary disease) ([place]) 05/21/2017  . H/O atrial flutter 05/21/2017  . Coronary disease/nonobstructive 05/21/2017  . Acute blood loss anemia 10/02/2016  . Abdominal pain 10/02/2016  . Constipation 10/02/2016  . Left lower quadrant pain   . Coagulopathy ([place])   . Heme positive stool   . Symptomatic anemia 07/10/2016  . Syncope 05/15/2015  . Cervical disc disease 05/15/2015  . Abnormal urinalysis 05/15/2015  . Anemia 05/15/2015  . History of CVA (cerebrovascular accident) 04/03/2014  . Near syncope 02/15/2014  . Pre-syncope 02/15/2014  .  Benign neoplasm of rectum and anal canal 12/02/2013  . Upper GI bleeding 11/30/2013  . Tobacco use disorder 11/30/2013  . Anticoagulation goal of INR 2 to 3, for PAF - CHA2DS2Vasc = 7 08/15/2013  . Type 2 diabetes mellitus ([place]) 01/22/2013  . Tobacco abuse counseling 12/15/2012  . Hyperlipidemia   . Obesity (BMI 30-39.9) 12/14/2012  . Persistent atrial fibrillation ([place]) 10/07/2010  . Atrial flutter - status post CTI ablation 07/29/2010  . Essential hypertension 07/29/2010  . Chronic diastolic congestive heart failure ([place]) 07/29/2010  . Coronary artery disease, non-occlusive 07/29/2010    Past  Surgical History:  Procedure Laterality Date  . ATRIAL ABLATION SURGERY  08/05/10   CTI ablation for atrial flutter by JA  . CARDIAC CATHETERIZATION  2012   nl LV function, no occlusive CAD, PAF  . CARDIOVERSION  12/07/2010    Successful direct current cardioversion with atrial fibrillation to normal sinus rhythm  . CARPAL TUNNEL RELEASE Bilateral 01/30/2014   Procedure: BILATERAL CARPAL TUNNEL RELEASE;  Surgeon: Marianna Payment, MD;  Location: [facility];  Service: Orthopedics;  Laterality: Bilateral;  . CATARACT EXTRACTION W/ INTRAOCULAR LENS  IMPLANT, BILATERAL Bilateral   . COLONOSCOPY [name] 12/02/2013   Procedure: COLONOSCOPY;  Surgeon: Irene Shipper, MD;  Location: [facility];  Service: Endoscopy;  Laterality: [name];  . ESOPHAGOGASTRODUODENOSCOPY [name] 09/22/2014   Procedure: ESOPHAGOGASTRODUODENOSCOPY (EGD);  Surgeon: Ronald Lobo, MD;  Location: [facility] ENDOSCOPY;  Service: Endoscopy;  Laterality: [name];  . ESOPHAGOGASTRODUODENOSCOPY [name] 07/11/2016   Procedure: ESOPHAGOGASTRODUODENOSCOPY (EGD);  Surgeon: Doran Stabler, MD;  Location: [facility] ENDOSCOPY;  Service: Endoscopy;  Laterality: [name];  . INCISION AND DRAINAGE ABSCESS / HEMATOMA OF BURSA / KNEE / THIGH Left 1998   knee  . KNEE BURSECTOMY Left 1998  . LAPAROSCOPIC CHOLECYSTECTOMY  03/2010  . LEFT HEART CATH AND CORONARY ANGIOGRAPHY [name] 10/05/2017   Procedure: LEFT HEART CATH AND CORONARY ANGIOGRAPHY;  Surgeon: Martinique, Peter M, MD;  Location: [facility] CV LAB;  Service: Cardiovascular;  Laterality: [name];  . NM MYOVIEW LTD  07/22/2013   Normal EF ~60%, no ischemia or infarction.  [name] PARTIAL GASTRECTOMY  1982   subtotal; "took out 30% for ulcers"  . TRANSTHORACIC ECHOCARDIOGRAM  02/16/2014   EF 60%, no RWMA. - otherwise normal  . YAG LASER APPLICATION Bilateral         Home Medications    Prior to Admission medications   Medication Sig Start Date End Date Taking? Authorizing Provider  albuterol (PROVENTIL HFA;VENTOLIN HFA) 108 (90 Base)  MCG/ACT inhaler Inhale 2 puffs into the lungs every 4 (four) hours as needed for wheezing or shortness of breath. 10/30/17   Palumbo, April, MD  aspirin EC 81 MG tablet Take 81 mg by mouth daily.    [provider]  citalopram (CELEXA) 20 MG tablet Take 1 tablet (20 mg total) by mouth daily. 06/17/18   Scot Jun, FNP  co-enzyme Q-10 30 MG capsule Take 30 mg by mouth daily.     [provider]  diclofenac sodium (VOLTAREN) 1 % GEL Apply 4 g topically 3 (three) times daily as needed (for back pain). Patient taking differently: Apply 4 g topically 2 (two) times daily as needed (for back pain).  11/10/17   Rory Percy, DO  diltiazem (CARDIZEM CD) 360 MG 24 hr capsule Take 1 capsule (360 mg total) by mouth daily. 05/22/18   Camnitz, Ocie Doyne, MD  ferrous sulfate 325 (65 FE) MG tablet TAKE 1 TABLET BY MOUTH  EVERY DAY 04/15/18   Camnitz, Ocie Doyne, MD  glucose blood (PRECISION QID TEST) test strip Check glucose twice daily 10/24/13   [provider]  magnesium hydroxide (DULCOLAX MILK OF MAGNESIA) 400 MG/5ML suspension Take 5 mLs by mouth daily as needed for mild constipation.    [provider]  Melatonin 3 MG TABS Take 1 tablet (3 mg total) by mouth at bedtime. 11/16/17   Anderson, Chelsey L, DO  nitroGLYCERIN (NITROSTAT) 0.4 MG SL tablet Place 1 tablet (0.4 mg total) under the tongue every 5 (five) minutes x 3 doses as needed for chest pain. 11/19/15   Erlene Quan, PA-C  pantoprazole (PROTONIX) 40 MG tablet Take 1 tablet (40 mg total) by mouth 2 (two) times daily before a meal. 11/30/16   Domenic Polite, MD  potassium chloride (K-DUR) 10 MEQ tablet Take 2 tablets (20 mEq total) by mouth daily for 2 days. 04/13/18 04/15/18  Nuala Alpha A, PA-C  pravastatin (PRAVACHOL) 40 MG tablet Take 1 tablet (40 mg total) by mouth every evening. 05/04/17   Leonie Man, MD  tamsulosin (FLOMAX) 0.4 MG CAPS capsule Take 0.4 mg by mouth 2 (two) times daily. 05/03/18    [provider]  vitamin C (ASCORBIC ACID) 250 MG tablet Take 1 tablet (250 mg total) by mouth daily. 01/20/18   Rise Mu, PA-C    Family History Family History  Problem Relation Age of Onset  . Breast cancer Mother   . Diabetes Mother   . Kidney disease Mother   . Heart attack Father   . Heart disease Father   . Heart attack Brother   . Leukemia Daughter     Social History Social History   Tobacco Use  . Smoking status: Current Every Day Smoker    Packs/day: 0.50    Years: 57.00    Pack years: 28.50    Types: Cigarettes  . Smokeless tobacco: Never Used  . Tobacco comment: He's been smoking between 0.5 and 2 ppd since age 75.  Substance Use Topics  . Alcohol use: No    Alcohol/week: 0.0 standard drinks    Comment: "quit drinking in 1986"  . Drug use: No     Allergies   Cyclobenzaprine   Review of Systems Review of Systems  All other systems reviewed and are negative.    Physical Exam Updated Vital Signs BP 106/62 (BP Location: Right Arm)   Pulse (!) 55   Temp 99.1 F (37.3 C) (Oral)   Resp 16   SpO2 97%   Physical Exam Vitals signs and nursing note reviewed.  Constitutional:      General: He is not in acute distress.    Appearance: Normal appearance. He is well-developed. He is not toxic-appearing.  HENT:     Head: Normocephalic and atraumatic.  Eyes:     General: Lids are normal.     Conjunctiva/sclera: Conjunctivae normal.     Pupils: Pupils are equal, round, and reactive to light.  Neck:     Musculoskeletal: Normal range of motion and neck supple.     Thyroid: No thyroid mass.     Trachea: No tracheal deviation.  Cardiovascular:     Rate and Rhythm: Normal rate and regular rhythm.     Heart sounds: Normal heart sounds. No murmur. No gallop.   Pulmonary:     Effort: Pulmonary effort is normal. No respiratory distress.     Breath sounds: Normal breath sounds. No stridor. No decreased breath sounds,  wheezing, rhonchi or rales.   Abdominal:     General: Bowel sounds are normal. There is no distension.     Palpations: Abdomen is soft.     Tenderness: There is abdominal tenderness in the epigastric area and left lower quadrant. There is guarding. There is no rebound.    Musculoskeletal: Normal range of motion.        General: No tenderness.  Skin:    General: Skin is warm and dry.     Findings: No abrasion or rash.  Neurological:     Mental Status: He is alert and oriented to person, place, and time.     GCS: GCS eye subscore is 4. GCS verbal subscore is 5. GCS motor subscore is 6.     Cranial Nerves: No cranial nerve deficit.     Sensory: No sensory deficit.  Psychiatric:        Speech: Speech normal.        Behavior: Behavior normal.      ED Treatments / Results  Labs (all labs ordered are listed, but only abnormal results are displayed) Labs Reviewed  BASIC METABOLIC PANEL - Abnormal; Notable for the following components:      Result Value   Potassium 3.4 (*)    CO2 20 (*)    Glucose, Bld 165 (*)    BUN 7 (*)    All other components within normal limits  CBC - Abnormal; Notable for the following components:   RBC 3.70 (*)    Hemoglobin 8.4 (*)    HCT 31.1 (*)    MCH 22.7 (*)    MCHC 27.0 (*)    All other components within normal limits  URINE CULTURE  TROPONIN I  URINALYSIS, ROUTINE W REFLEX MICROSCOPIC  LIPASE, BLOOD  HEPATIC FUNCTION PANEL    EKG   Radiology Dg Chest 2 View  Result Date: 07/10/2018 CLINICAL DATA:  78 year old male with a history of chest pain and shortness of breath EXAM: CHEST - 2 VIEW COMPARISON:  04/28/2018, 04/14/2018 FINDINGS: Cardiomediastinal silhouette unchanged in size and contour. No evidence of central vascular congestion. No pneumothorax or pleural effusion. No confluent airspace disease. Similar appearance of coarsened interstitial markings throughout. No displaced fracture with degenerative changes of the spine. Surgical changes in the epigastric region.  IMPRESSION: Chronic lung changes without evidence of acute cardiopulmonary disease. Electronically Signed   By: Corrie Mckusick D.O.   On: 07/10/2018 18:39    Procedures Procedures (including critical care time)  Medications Ordered in ED Medications  sodium chloride flush (NS) 0.9 % injection 3 mL (has no administration in time range)  morphine 4 MG/ML injection 4 mg (has no administration in time range)     Initial Impression / Assessment and Plan / ED Course  I have reviewed the triage vital signs and the nursing notes.  Pertinent labs & imaging results that were available during my care of the patient were reviewed by me and considered in my medical decision making (see chart for details).        Patient treated with Rocephin and morphine for UTI as well as for kidney stone.  Patient seen by urology and recommends patient be discharged with prescription for Bactrim, Flomax as well as analgesics.  They have arranged outpatient follow-up  Final Clinical Impressions(s) / ED Diagnoses   Final diagnoses:  None    ED Discharge Orders    None       Lacretia Leigh, MD 07/10/18 2132

## 2018-07-10 NOTE — Care Management (Addendum)
Patient noted to have had 16 ED visits in the past 6 months patient is active with the Primary Care at [facility] last visit 05/20/2018. Referral was made at time of visit for OP [place] with Dr. Darleene Cleaver. Patient has also been referred to [facility] back in January as well.   CM will contact clinic's CM to follow up with patient concerning transitional care follow up once patient is medically cleared for discharge from the ED   [place] [phone]

## 2018-07-10 NOTE — ED Notes (Signed)
Patient transported to CT 

## 2018-07-10 NOTE — Consult Note (Signed)
Urology Consult Note   Requesting Attending Physician:  Lacretia Leigh, MD Service Providing Consult: Urology  Consulting Attending: Dr. Alinda Money   Reason for Consult:  urolithiasis  HPI: [name] is seen in consultation for reasons noted above at the request of Lacretia Leigh, MD for evaluation of acute ureteral stone.  This is a 78 y.o. male with multiple significant comorbidities who presents with chronic LEFT abdominal discomfort and abdominal discomfort. He has had multiple other ED visits for similar complaint per patient report. On arrival, patient's pain distribution began to also arise from the Right flank as well, x 1 hour in duration. He had a CT scan which revealed an 11 mm Right proximal ureteral stone, as well as chronic left gastric distension s/p prior gastrectomy. He was clinically stable although in Afib with HR 95-103. He was afebrile, with no leukocytosis. He did have a UA with + nitrite and >50 WBC although he has a 67 week old indwelling urethral catheter in place. He has had an indwelling catheter for months due to chronic urinary retention. He does not endorse any new lower urinary tract symptoms or suprapubic discomfort.   He has a history of nephrolithiasis s/p prior lithotripsy. He has previously seen Dr. Lovena Neighbours in our practice.   Creatinine 1.06 No leukocytosis  No blood thinners NPO since 1 pm  Past Medical History: Past Medical History:  Diagnosis Date  . Allergic rhinitis   . Anemia    takes Ferrous Sulfate daily  . Arthritis    "all over"  . Atrial flutter ([place])   . Balance problem 01/2014  . CAP (community acquired pneumonia) 09/18/2014  . Cervical radiculopathy due to degenerative joint disease of spine   . Chronic coronary artery disease    a. 03/2010 Nonocclusive disease by cath, performed for ST elevations on ECG;  b. 06/2013 Lexi MV: EF 60%, no ischemia.  [name] COPD (chronic obstructive pulmonary disease) ([place])   . Diabetes mellitus type II    takes Metformin and Lantus daily  . Diastolic CHF, chronic ([place])    a. 12/2012 EF 55-60%, diast dysfxn, triv MR, mildly dil LA/RA.  [name] Essential hypertension    takes Diltiazem daily  . Frail elderly   . GERD (gastroesophageal reflux disease)   . History of blood transfusion 1982   "when I had stomach OR"  . History of bronchitis    1998  . History of CVA (cerebrovascular accident)   . History of gastric ulcer   . Hypercholesterolemia    takes Pravastatin daily  . Intrinsic eczema   . Legally blind in left eye, as defined in [place]   . Obesity   . PAF (paroxysmal atrial fibrillation) ([facility])    Recurrent after atrial flutter (a. 07/2010 Status post caval tricuspid isthmus ablation by Dr. Midge Aver Metoprolol daily), currently controlled on flecainide plus diltiazem and Coumadin  . Vitamin D deficiency   . Weakness    numbness and tingling both hands    Past Surgical History:  Past Surgical History:  Procedure Laterality Date  . ATRIAL ABLATION SURGERY  08/05/10   CTI ablation for atrial flutter by JA  . CARDIAC CATHETERIZATION  2012   nl LV function, no occlusive CAD, PAF  . CARDIOVERSION  12/07/2010    Successful direct current cardioversion with atrial fibrillation to normal sinus rhythm  . CARPAL TUNNEL RELEASE Bilateral 01/30/2014   Procedure: BILATERAL CARPAL TUNNEL RELEASE;  Surgeon: Marianna Payment, MD;  Location: [facility];  Service: Orthopedics;  Laterality: Bilateral;  . CATARACT EXTRACTION W/ INTRAOCULAR LENS  IMPLANT, BILATERAL Bilateral   . COLONOSCOPY [name] 12/02/2013   Procedure: COLONOSCOPY;  Surgeon: Irene Shipper, MD;  Location: [facility];  Service: Endoscopy;  Laterality: [name];  . ESOPHAGOGASTRODUODENOSCOPY [name] 09/22/2014   Procedure: ESOPHAGOGASTRODUODENOSCOPY (EGD);  Surgeon: Ronald Lobo, MD;  Location: [facility] ENDOSCOPY;  Service: Endoscopy;  Laterality: [name];  . ESOPHAGOGASTRODUODENOSCOPY [name] 07/11/2016   Procedure: ESOPHAGOGASTRODUODENOSCOPY (EGD);  Surgeon: Doran Stabler, MD;  Location: [facility] ENDOSCOPY;  Service: Endoscopy;  Laterality: [name];  . INCISION AND DRAINAGE ABSCESS / HEMATOMA OF BURSA / KNEE / THIGH Left 1998   knee  . KNEE BURSECTOMY Left 1998  . LAPAROSCOPIC CHOLECYSTECTOMY  03/2010  . LEFT HEART CATH AND CORONARY ANGIOGRAPHY [name] 10/05/2017   Procedure: LEFT HEART CATH AND CORONARY ANGIOGRAPHY;  Surgeon: Martinique, Peter M, MD;  Location: [facility];  Service: Cardiovascular;  Laterality: [name];  . NM MYOVIEW LTD  07/22/2013   Normal EF ~60%, no ischemia or infarction.  [name] PARTIAL GASTRECTOMY  1982   subtotal; "took out 30% for ulcers"  . TRANSTHORACIC ECHOCARDIOGRAM  02/16/2014   EF 60%, no RWMA. - otherwise normal  . YAG LASER APPLICATION Bilateral     Medication: Current Facility-Administered Medications  Medication Dose Route Frequency Provider Last Rate Last Dose  . sodium chloride flush (NS) 0.9 % injection 3 mL  3 mL Intravenous Once Lacretia Leigh, MD       Current Outpatient Medications  Medication Sig Dispense Refill  . albuterol (PROVENTIL HFA;VENTOLIN HFA) 108 (90 Base) MCG/ACT inhaler Inhale 2 puffs into the lungs every 4 (four) hours as needed for wheezing or shortness of breath. 1 Inhaler 0  . aspirin EC 81 MG tablet Take 81 mg by mouth daily.    . citalopram (CELEXA) 20 MG tablet Take 1 tablet (20 mg total) by mouth daily. 90 tablet 1  . co-enzyme Q-10 30 MG capsule Take 30 mg by mouth daily.     . diclofenac sodium (VOLTAREN) 1 % GEL Apply 4 g topically 3 (three) times daily as needed (for back pain). (Patient taking differently: Apply 4 g topically 2 (two) times daily as needed (for back pain). ) 1 Tube 0  . diltiazem (CARDIZEM CD) 360 MG 24 hr capsule Take 1 capsule (360 mg total) by mouth daily. 90 capsule 2  . ferrous sulfate 325 (65 FE) MG tablet TAKE 1 TABLET BY MOUTH EVERY DAY 90 tablet 3  . glucose blood (PRECISION QID TEST) test strip Check glucose twice daily    . magnesium hydroxide (DULCOLAX MILK OF  MAGNESIA) 400 MG/5ML suspension Take 5 mLs by mouth daily as needed for mild constipation.    . Melatonin 3 MG TABS Take 1 tablet (3 mg total) by mouth at bedtime. 30 tablet 0  . nitroGLYCERIN (NITROSTAT) 0.4 MG SL tablet Place 1 tablet (0.4 mg total) under the tongue every 5 (five) minutes x 3 doses as needed for chest pain. 25 tablet 9  . pantoprazole (PROTONIX) 40 MG tablet Take 1 tablet (40 mg total) by mouth 2 (two) times daily before a meal.    . potassium chloride (K-DUR) 10 MEQ tablet Take 2 tablets (20 mEq total) by mouth daily for 2 days. 4 tablet 0  . pravastatin (PRAVACHOL) 40 MG tablet Take 1 tablet (40 mg total) by mouth every evening. 90 tablet 3  . tamsulosin (FLOMAX) 0.4 MG CAPS capsule Take 0.4 mg by mouth 2 (two) times  daily.    . vitamin C (ASCORBIC ACID) 250 MG tablet Take 1 tablet (250 mg total) by mouth daily. 30 tablet 2    Allergies: Allergies  Allergen Reactions  . Cyclobenzaprine Other (See Comments)    Unsteady gait    Social History: Social History   Tobacco Use  . Smoking status: Current Every Day Smoker    Packs/day: 0.50    Years: 57.00    Pack years: 28.50    Types: Cigarettes  . Smokeless tobacco: Never Used  . Tobacco comment: He's been smoking between 0.5 and 2 ppd since age 9.  Substance Use Topics  . Alcohol use: No    Alcohol/week: 0.0 standard drinks    Comment: "quit drinking in 1986"  . Drug use: No    Family History Family History  Problem Relation Age of Onset  . Breast cancer Mother   . Diabetes Mother   . Kidney disease Mother   . Heart attack Father   . Heart disease Father   . Heart attack Brother   . Leukemia Daughter     Review of Systems 10 systems were reviewed and are negative except as noted specifically in the HPI.  Objective   Vital signs in last 24 hours: BP 136/81   Pulse (!) 109   Temp 99.1 F (37.3 C) (Oral)   Resp 20   SpO2 99%   Physical Exam General: NAD, A&O, resting, appropriate HEENT:  [place]/AT, EOMI, MMM Pulmonary: Normal work of breathing Cardiovascular: HDS, adequate peripheral perfusion Abdomen: Soft, NTTP, moderate left abdominal distension, tympanic, left sided upper qudrant discomfort to palpation. Mild right lower quadrant discomfort. GU: indwelling foley catheter in place, no CVA tenderness Extremities: warm and well perfused Neuro: Appropriate, no focal neurological deficits  Most Recent Labs: Lab Results  Component Value Date   WBC 5.1 07/10/2018   HGB 8.4 (L) 07/10/2018   HCT 31.1 (L) 07/10/2018   PLT 314 07/10/2018    Lab Results  Component Value Date   NA 139 07/10/2018   K 3.4 (L) 07/10/2018   CL 106 07/10/2018   CO2 20 (L) 07/10/2018   BUN 7 (L) 07/10/2018   CREATININE 1.06 07/10/2018   CALCIUM 9.1 07/10/2018   MG 1.7 04/08/2018   PHOS 4.0 01/14/2018    Lab Results  Component Value Date   INR 0.97 11/08/2017   APTT 31 11/08/2017     IMAGING: Dg Chest 2 View  Result Date: 07/10/2018 CLINICAL DATA:  78 year old male with a history of chest pain and shortness of breath EXAM: CHEST - 2 VIEW COMPARISON:  04/28/2018, 04/14/2018 FINDINGS: Cardiomediastinal silhouette unchanged in size and contour. No evidence of central vascular congestion. No pneumothorax or pleural effusion. No confluent airspace disease. Similar appearance of coarsened interstitial markings throughout. No displaced fracture with degenerative changes of the spine. Surgical changes in the epigastric region. IMPRESSION: Chronic lung changes without evidence of acute cardiopulmonary disease. Electronically Signed   By: Corrie Mckusick D.O.   On: 07/10/2018 18:39   Ct Abdomen Pelvis W Contrast  Result Date: 07/10/2018 CLINICAL DATA:  Nausea and chest pain EXAM: CT ABDOMEN AND PELVIS WITH CONTRAST TECHNIQUE: Multidetector CT imaging of the abdomen and pelvis was performed using the standard protocol following bolus administration of intravenous contrast. CONTRAST:  149mL OMNIPAQUE 300  COMPARISON:  07/27/2017 FINDINGS: Lower chest: Lung bases are free of acute infiltrate or sizable effusion. Mild left basilar atelectasis is seen. Hepatobiliary: No focal liver abnormality is seen. Status post  cholecystectomy. No biliary dilatation. Pancreas: Unremarkable. No pancreatic ductal dilatation or surrounding inflammatory changes. Spleen: Normal in size without focal abnormality. Adrenals/Urinary Tract: Adrenal glands are within normal limits. Kidneys are well visualized bilaterally. Left kidney demonstrates no renal calculi or obstructive change. The bladder is decompressed by Foley catheter. The right kidney demonstrates no renal calculi although minimal fullness of the collecting system is noted secondary to a 11 mm proximal right ureteral stone. This has migrated from the right kidney seen on the prior exam. The more distal right ureter is within normal limits. Stomach/Bowel: Scattered diverticular change of the colon is noted. No diverticulitis is seen. The appendix is within normal limits. Postsurgical changes are noted in the stomach with evidence of a gastrojejunostomy. The stomach is significantly distended with food stuffs slightly increased when compared with the prior exam. The anastomosis appears widely patent with the small bowel although some adhesions may contribute to the degree of dilatation in the stomach. Vascular/Lymphatic: Aortic atherosclerosis. No enlarged abdominal or pelvic lymph nodes. Reproductive: Prostate is unremarkable. Other: No abdominal wall hernia or abnormality. No abdominopelvic ascites. Musculoskeletal: Degenerative changes of lumbar spine are noted. No acute bony abnormality is seen. IMPRESSION: 11 mm proximal right ureteral stone with mild hydronephrosis. This stone has migrated from the right kidney seen on the prior exam. Changes of gastrojejunostomy with significant distention of the stomach with ingested food stuffs. The anastomosis appears patent although the  jejunal loop superior intimately apposed to the stomach which may be related to a degree of adhesions. Clinical correlation is recommended. Electronically Signed   By: Inez Catalina M.D.   On: 07/10/2018 19:57    ------  Assessment:  78 y.o. male with an 11 mm Right proximal ureteral stone who clinically does not appear acutely infected. He does have a dirty UA although likely secondary to bacteriuria from a 51 week old indwelling catheter. I had a long discussion with him regarding management of his stone, leaning towards a conservative strategy at this time as his pain is mild and controlled with oral meds and he does not appear systemically ill. I explained the drawback of upfront ureteral stent placement as he is a higher risk surgical candidate, higher aspiration risk with his gastric distension, and we would like to refrain from multiple anesthetics. Along with this, with proper cardiac clearance, he may be a candidate for ESWL in the outpatient setting as well. I did also review the indications for ED return and the need for urgent stent placement.   Recommendations: - Recommend conservative management at this time - Urine culture collection confirmed - Empiric treatment with PO Bactrim, tailor to culture data as appropriate - May prescribe Flomax, oral narcotic. Toradol in ED okay as well if no other contraindication, creatinine is wnl - Return precautions discussed with patient. Fevers or clinical decompensation would warrant urgent stent placement - We will ensure proper clinic follow up   Thank you for this consult. Please contact the urology consult pager with any further questions/concerns.

## 2018-07-10 NOTE — ED Notes (Signed)
Patient verbalizes understanding of discharge instructions. Opportunity for questioning and answers were provided. Armband removed by staff, pt discharged from ED.  

## 2018-07-10 NOTE — ED Triage Notes (Signed)
Patient presents to the ED by EMS with c/o chest pain with SOB since yesterday 1700. Reports difficulty sleeping, called ems pain eased off then called back. When pain is intense he feels nauseated. 324 ASA PTA. Urinary Catheter in place.   114/66 89P 18 resp 97% Ra 97.1 temp.

## 2018-07-12 ENCOUNTER — Other Ambulatory Visit: Payer: Self-pay | Admitting: Urology

## 2018-07-12 DIAGNOSIS — N201 Calculus of ureter: Secondary | ICD-10-CM | POA: Diagnosis not present

## 2018-07-12 DIAGNOSIS — R1084 Generalized abdominal pain: Secondary | ICD-10-CM | POA: Diagnosis not present

## 2018-07-12 DIAGNOSIS — R338 Other retention of urine: Secondary | ICD-10-CM | POA: Diagnosis not present

## 2018-07-12 LAB — URINE CULTURE: Culture: >=100000 — AB

## 2018-07-13 ENCOUNTER — Telehealth: Payer: Self-pay | Admitting: Emergency Medicine

## 2018-07-13 NOTE — Telephone Encounter (Signed)
Post ED Visit - Positive Culture Follow-up  Culture report reviewed by antimicrobial stewardship pharmacist: [facility] Team []  Elenor Quinones, Pharm.D. []  Heide Guile, Pharm.D., BCPS AQ-ID []  Parks Neptune, Pharm.D., BCPS []  Alycia Rossetti, Pharm.D., BCPS []  [place], [place].D., BCPS, AAHIVP []  Legrand Como, Pharm.D., BCPS, AAHIVP [x]  Salome Arnt, PharmD, BCPS []  Johnnette Gourd, PharmD, BCPS []  Hughes Better, PharmD, BCPS []  Leeroy Cha, PharmD []  Laqueta Linden, PharmD, BCPS []  Albertina Parr, PharmD  [facility] Team []  Leodis Sias, PharmD []  Lindell Spar, PharmD []  Royetta Asal, PharmD []  Graylin Shiver, Rph []  Rema Fendt) Glennon Mac, PharmD []  Arlyn Dunning, PharmD []  Netta Cedars, PharmD []  Dia Sitter, PharmD []  Leone Haven, PharmD []  Gretta Arab, PharmD []  Theodis Shove, PharmD []  Peggyann Juba, PharmD []  Reuel Boom, PharmD   Positive urine culture Treated with sulfamethoxazole-trimethoprim, organism sensitive to the same and no further patient follow-up is required at this time.  [place]e [place] Shantanu Strauch 07/13/2018, 2:32 PM

## 2018-07-15 ENCOUNTER — Encounter (HOSPITAL_COMMUNITY)
Admission: RE | Disposition: A | Discharge status: Home / Self Care | Payer: Self-pay | Source: Home / Self Care | Attending: Urology

## 2018-07-15 ENCOUNTER — Encounter (HOSPITAL_COMMUNITY): Payer: Self-pay | Admitting: *Deleted

## 2018-07-15 ENCOUNTER — Other Ambulatory Visit: Payer: Self-pay

## 2018-07-15 ENCOUNTER — Ambulatory Visit (HOSPITAL_COMMUNITY)
Admission: RE | Admit: 2018-07-15 | Discharge: 2018-07-15 | Disposition: A | Discharge status: Home / Self Care | Payer: Medicare Other | Attending: Urology | Admitting: Urology

## 2018-07-15 ENCOUNTER — Ambulatory Visit (HOSPITAL_COMMUNITY): Payer: Medicare Other

## 2018-07-15 DIAGNOSIS — E669 Obesity, unspecified: Secondary | ICD-10-CM | POA: Insufficient documentation

## 2018-07-15 DIAGNOSIS — N200 Calculus of kidney: Secondary | ICD-10-CM | POA: Diagnosis not present

## 2018-07-15 DIAGNOSIS — N201 Calculus of ureter: Secondary | ICD-10-CM | POA: Diagnosis not present

## 2018-07-15 DIAGNOSIS — Z79899 Other long term (current) drug therapy: Secondary | ICD-10-CM | POA: Diagnosis not present

## 2018-07-15 DIAGNOSIS — Z87442 Personal history of urinary calculi: Secondary | ICD-10-CM | POA: Insufficient documentation

## 2018-07-15 DIAGNOSIS — E119 Type 2 diabetes mellitus without complications: Secondary | ICD-10-CM | POA: Diagnosis not present

## 2018-07-15 DIAGNOSIS — F17200 Nicotine dependence, unspecified, uncomplicated: Secondary | ICD-10-CM | POA: Diagnosis not present

## 2018-07-15 DIAGNOSIS — I499 Cardiac arrhythmia, unspecified: Secondary | ICD-10-CM | POA: Diagnosis not present

## 2018-07-15 DIAGNOSIS — N202 Calculus of kidney with calculus of ureter: Secondary | ICD-10-CM | POA: Diagnosis not present

## 2018-07-15 HISTORY — DX: Personal history of urinary calculi: Z87.442

## 2018-07-15 HISTORY — DX: Dyspnea, unspecified: R06.00

## 2018-07-15 HISTORY — PX: EXTRACORPOREAL SHOCK WAVE LITHOTRIPSY: SHX1557

## 2018-07-15 LAB — GLUCOSE, CAPILLARY: Glucose-Capillary: 143 mg/dL — ABNORMAL HIGH (ref 70–99)

## 2018-07-15 SURGERY — LITHOTRIPSY, ESWL
Anesthesia: LOCAL | Laterality: Right

## 2018-07-15 MED ORDER — OXYCODONE-ACETAMINOPHEN 5-325 MG PO TABS: 1.0000 | ORAL_TABLET | ORAL | 0 refills | Status: DC | PRN

## 2018-07-15 MED ORDER — DIAZEPAM 5 MG PO TABS
10.0000 mg | ORAL_TABLET | ORAL | Status: AC
  Administered 2018-07-15: 10 mg via ORAL
  Filled 2018-07-15: qty 2

## 2018-07-15 MED ORDER — DIPHENHYDRAMINE HCL 25 MG PO CAPS
25.0000 mg | ORAL_CAPSULE | ORAL | Status: AC
  Administered 2018-07-15: 25 mg via ORAL
  Filled 2018-07-15: qty 1

## 2018-07-15 MED ORDER — CIPROFLOXACIN HCL 500 MG PO TABS
500.0000 mg | ORAL_TABLET | ORAL | Status: AC
  Administered 2018-07-15: 12:00:00 500 mg via ORAL
  Filled 2018-07-15: qty 1

## 2018-07-15 MED ORDER — SODIUM CHLORIDE 0.9 % IV SOLN
INTRAVENOUS | Status: DC
  Administered 2018-07-15: 12:00:00 via INTRAVENOUS

## 2018-07-15 NOTE — Op Note (Signed)
See [facility] OP note scanned into chart. Also because of the size, density, location and other factors that cannot be anticipated I feel this will likely be a staged procedure. This fact supersedes any indication in the scanned [facility] operative note to the contrary.  

## 2018-07-15 NOTE — H&P (Signed)
See scanned H&P

## 2018-07-15 NOTE — Discharge Instructions (Signed)
Moderate Conscious Sedation, Adult, Care After These instructions provide you with information about caring for yourself after your procedure. Your health care provider may also give you more specific instructions. Your treatment has been planned according to current medical practices, but problems sometimes occur. Call your health care provider if you have any problems or questions after your procedure. What can I expect after the procedure? After your procedure, it is common:  To feel sleepy for several hours.  To feel clumsy and have poor balance for several hours.  To have poor judgment for several hours.  To vomit if you eat too soon. Follow these instructions at home: For at least 24 hours after the procedure:   Do not: ? Participate in activities where you could fall or become injured. ? Drive. ? Use heavy machinery. ? Drink alcohol. ? Take sleeping pills or medicines that cause drowsiness. ? Make important decisions or sign legal documents. ? Take care of children on your own.  Rest. Eating and drinking  Follow the diet recommended by your health care provider.  If you vomit: ? Drink water, juice, or soup when you can drink without vomiting. ? Make sure you have little or no nausea before eating solid foods. General instructions  Have a responsible adult stay with you until you are awake and alert.  Take over-the-counter and prescription medicines only as told by your health care provider.  If you smoke, do not smoke without supervision.  Keep all follow-up visits as told by your health care provider. This is important. Contact a health care provider if:  You keep feeling nauseous or you keep vomiting.  You feel light-headed.  You develop a rash.  You have a fever. Get help right away if:  You have trouble breathing. This information is not intended to replace advice given to you by your health care provider. Make sure you discuss any questions you have  with your health care provider. Document Released: 01/01/2013 Document Revised: 08/16/2015 Document Reviewed: 07/03/2015 Elsevier Interactive Patient Education  2019 [place] After This sheet gives you information about how to care for yourself after your procedure. Your health care provider may also give you more specific instructions. If you have problems or questions, contact your health care provider. What can I expect after the procedure? After the procedure, it is common to have:  Some blood in your urine. This should only last for a few days.  Soreness in your back, sides, or upper abdomen for a few days.  Blotches or bruises on your back where the pressure wave entered the skin.  Pain, discomfort, or nausea when pieces (fragments) of the kidney stone move through the tube that carries urine from the kidney to the bladder (ureter). Stone fragments may pass soon after the procedure, but they may continue to pass for up to 4-8 weeks. ? If you have severe pain or nausea, contact your health care provider. This may be caused by a large stone that was not broken up, and this may mean that you need more treatment.  Some pain or discomfort during urination.  Some pain or discomfort in the lower abdomen or (in men) at the base of the penis. Follow these instructions at home: Medicines  Take over-the-counter and prescription medicines only as told by your health care provider.  If you were prescribed an antibiotic medicine, take it as told by your health care provider. Do not stop taking the antibiotic even if you start to  feel better.  Do not drive for 24 hours if you were given a medicine to help you relax (sedative).  Do not drive or use heavy machinery while taking prescription pain medicine. Eating and drinking      Drink enough water and fluids to keep your urine clear or pale yellow. This helps any remaining pieces of the stone to pass. It can also help  prevent new stones from forming.  Eat plenty of fresh fruits and vegetables.  Follow instructions from your health care provider about eating and drinking restrictions. You may be instructed: ? To reduce how much salt (sodium) you eat or drink. Check ingredients and nutrition facts on packaged foods and beverages. ? To reduce how much meat you eat.  Eat the recommended amount of calcium for your age and gender. Ask your health care provider how much calcium you should have. General instructions  Get plenty of rest.  Most people can resume normal activities 1-2 days after the procedure. Ask your health care provider what activities are safe for you.  Your health care provider may direct you to lie in a certain position (postural drainage) and tap firmly (percuss) over your kidney area to help stone fragments pass. Follow instructions as told by your health care provider.  If directed, strain all urine through the strainer that was provided by your health care provider. ? Keep all fragments for your health care provider to see. Any stones that are found may be sent to a medical lab for examination. The stone may be as small as a grain of salt.  Keep all follow-up visits as told by your health care provider. This is important. Contact a health care provider if:  You have pain that is severe or does not get better with medicine.  You have nausea that is severe or does not go away.  You have blood in your urine longer than your health care provider told you to expect.  You have more blood in your urine.  You have pain during urination that does not go away.  You urinate more frequently than usual and this does not go away.  You develop a rash or any other possible signs of an allergic reaction. Get help right away if:  You have severe pain in your back, sides, or upper abdomen.  You have severe pain while urinating.  Your urine is very dark red.  You have blood in your stool  (feces).  You cannot pass any urine at all.  You feel a strong urge to urinate after emptying your bladder.  You have a fever or chills.  You develop shortness of breath, difficulty breathing, or chest pain.  You have severe nausea that leads to persistent vomiting.  You faint. Summary  After this procedure, it is common to have some pain, discomfort, or nausea when pieces (fragments) of the kidney stone move through the tube that carries urine from the kidney to the bladder (ureter). If this pain or nausea is severe, however, you should contact your health care provider.  Most people can resume normal activities 1-2 days after the procedure. Ask your health care provider what activities are safe for you.  Drink enough water and fluids to keep your urine clear or pale yellow. This helps any remaining pieces of the stone to pass, and it can help prevent new stones from forming.  If directed, strain your urine and keep all fragments for your health care provider to see. Fragments or stones  may be as small as a grain of salt.  Get help right away if you have severe pain in your back, sides, or upper abdomen or have severe pain while urinating. This information is not intended to replace advice given to you by your health care provider. Make sure you discuss any questions you have with your health care provider. Document Released: 04/02/2007 Document Revised: 08/22/2017 Document Reviewed: 02/02/2016 Elsevier Interactive Patient Education  2019 [employer].

## 2018-07-16 ENCOUNTER — Encounter (HOSPITAL_COMMUNITY): Payer: Self-pay | Admitting: Urology

## 2018-07-17 ENCOUNTER — Emergency Department (HOSPITAL_COMMUNITY): Payer: Medicare Other

## 2018-07-17 ENCOUNTER — Other Ambulatory Visit: Payer: Self-pay

## 2018-07-17 ENCOUNTER — Encounter (HOSPITAL_COMMUNITY): Payer: Self-pay | Admitting: *Deleted

## 2018-07-17 ENCOUNTER — Emergency Department (HOSPITAL_COMMUNITY)
Admission: EM | Admit: 2018-07-17 | Discharge: 2018-07-17 | Disposition: A | Discharge status: Home / Self Care | Payer: Medicare Other | Attending: Emergency Medicine | Admitting: Emergency Medicine

## 2018-07-17 DIAGNOSIS — M7989 Other specified soft tissue disorders: Secondary | ICD-10-CM | POA: Diagnosis not present

## 2018-07-17 DIAGNOSIS — D128 Benign neoplasm of rectum: Secondary | ICD-10-CM | POA: Diagnosis not present

## 2018-07-17 DIAGNOSIS — M79642 Pain in left hand: Secondary | ICD-10-CM | POA: Diagnosis not present

## 2018-07-17 DIAGNOSIS — M109 Gout, unspecified: Secondary | ICD-10-CM | POA: Diagnosis not present

## 2018-07-17 DIAGNOSIS — I2510 Atherosclerotic heart disease of native coronary artery without angina pectoris: Secondary | ICD-10-CM | POA: Diagnosis not present

## 2018-07-17 DIAGNOSIS — R609 Edema, unspecified: Secondary | ICD-10-CM | POA: Diagnosis not present

## 2018-07-17 DIAGNOSIS — Z79899 Other long term (current) drug therapy: Secondary | ICD-10-CM | POA: Diagnosis not present

## 2018-07-17 DIAGNOSIS — E119 Type 2 diabetes mellitus without complications: Secondary | ICD-10-CM | POA: Diagnosis not present

## 2018-07-17 DIAGNOSIS — I110 Hypertensive heart disease with heart failure: Secondary | ICD-10-CM | POA: Diagnosis not present

## 2018-07-17 DIAGNOSIS — J449 Chronic obstructive pulmonary disease, unspecified: Secondary | ICD-10-CM | POA: Insufficient documentation

## 2018-07-17 DIAGNOSIS — Z7982 Long term (current) use of aspirin: Secondary | ICD-10-CM | POA: Diagnosis not present

## 2018-07-17 DIAGNOSIS — D129 Benign neoplasm of anus and anal canal: Secondary | ICD-10-CM | POA: Diagnosis not present

## 2018-07-17 DIAGNOSIS — F17210 Nicotine dependence, cigarettes, uncomplicated: Secondary | ICD-10-CM | POA: Diagnosis not present

## 2018-07-17 DIAGNOSIS — W19XXXA Unspecified fall, initial encounter: Secondary | ICD-10-CM | POA: Diagnosis not present

## 2018-07-17 DIAGNOSIS — R52 Pain, unspecified: Secondary | ICD-10-CM | POA: Diagnosis not present

## 2018-07-17 DIAGNOSIS — W19XXXD Unspecified fall, subsequent encounter: Secondary | ICD-10-CM | POA: Insufficient documentation

## 2018-07-17 DIAGNOSIS — I5032 Chronic diastolic (congestive) heart failure: Secondary | ICD-10-CM | POA: Diagnosis not present

## 2018-07-17 HISTORY — DX: Retention of urine, unspecified: R33.9

## 2018-07-17 MED ORDER — CELECOXIB 200 MG PO CAPS: 200.0000 mg | ORAL_CAPSULE | Freq: Two times a day (BID) | ORAL | 0 refills | Status: DC

## 2018-07-17 MED ORDER — PREDNISONE 10 MG (21) PO TBPK: ORAL_TABLET | ORAL | 0 refills | Status: DC

## 2018-07-17 MED ORDER — MORPHINE SULFATE (PF) 4 MG/ML IV SOLN
4.0000 mg | Freq: Once | INTRAVENOUS | Status: AC
  Administered 2018-07-17: 4 mg via INTRAMUSCULAR
  Filled 2018-07-17: qty 1

## 2018-07-17 MED ORDER — COLCHICINE 0.6 MG PO TABS: ORAL_TABLET | ORAL | 0 refills | Status: DC

## 2018-07-17 MED ORDER — DEXAMETHASONE SODIUM PHOSPHATE 10 MG/ML IJ SOLN
10.0000 mg | Freq: Once | INTRAMUSCULAR | Status: AC
  Administered 2018-07-17: 10 mg via INTRAMUSCULAR
  Filled 2018-07-17: qty 1

## 2018-07-17 NOTE — ED Notes (Signed)
Pt given discharge instructions, prescriptions and follow up information. Pt given the opportunity to ask questions. Pt verbalized understanding. Pt discharged from the ED without incident.

## 2018-07-17 NOTE — ED Triage Notes (Signed)
Pt states 2 weeks ago he slipped off the bed and landed on his L hand 2 weeks ago.  Hand swollen with limited movement.

## 2018-07-17 NOTE — ED Provider Notes (Signed)
[facility] EMERGENCY DEPARTMENT Provider Note   CSN: [id number] Arrival date & time: 07/17/18  9381    History   Chief Complaint Chief Complaint  Patient presents with  . Hand Pain    HPI [name] is a 78 y.o. male.     Pt presents to the ED today with left hand pain.  Pt said he fell about 2 weeks ago and hurt his hand.  It's been swollen since then.  He said he's been taking oxycodone for his kidney stone, but that is not helping the pain in his hand.     Past Medical History:  Diagnosis Date  . Allergic rhinitis   . Anemia    takes Ferrous Sulfate daily  . Arthritis    "all over"  . Atrial flutter ([place])   . Balance problem 01/2014  . CAP (community acquired pneumonia) 09/18/2014  . Cervical radiculopathy due to degenerative joint disease of spine   . Chronic coronary artery disease    a. 03/2010 Nonocclusive disease by cath, performed for ST elevations on ECG;  b. 06/2013 Lexi MV: EF 60%, no ischemia.  [name] COPD (chronic obstructive pulmonary disease) ([place])   . Diabetes mellitus type II    takes Metformin and Lantus daily  . Diastolic CHF, chronic ([place])    a. 12/2012 EF 55-60%, diast dysfxn, triv MR, mildly dil LA/RA.  [name] Dyspnea    with pain  . Essential hypertension    takes Diltiazem daily  . Frail elderly   . GERD (gastroesophageal reflux disease)   . History of blood transfusion 1982   "when I had stomach OR"  . History of bronchitis    1998  . History of CVA (cerebrovascular accident)   . History of gastric ulcer   . History of kidney stones   . Hypercholesterolemia    takes Pravastatin daily  . Intrinsic eczema   . Legally blind in left eye, as defined in [place]   . Obesity   . PAF (paroxysmal atrial fibrillation) (H[facility])    Recurrent after atrial flutter (a. 07/2010 Status post caval tricuspid isthmus ablation by Dr. Midge Aver Metoprolol daily), currently controlled on flecainide plus diltiazem and Coumadin  . Urinary retention    . Vitamin D deficiency   . Weakness    numbness and tingling both hands    Patient Active Problem List   Diagnosis Date Noted  . Major depressive disorder, recurrent severe without psychotic features ([place]) 04/15/2018  . Depression   . Rapid atrial fibrillation ([place]) 01/18/2018  . Atrial fibrillation with RVR ([place]) 11/08/2017  . Chest pain with moderate risk of acute coronary syndrome 10/04/2017  . HTN (hypertension) 05/21/2017  . Diabetes mellitus type II 05/21/2017  . Diastolic CHF, chronic ([place]) 05/21/2017  . COPD (chronic obstructive pulmonary disease) ([place]) 05/21/2017  . H/O atrial flutter 05/21/2017  . Coronary disease/nonobstructive 05/21/2017  . Acute blood loss anemia 10/02/2016  . Abdominal pain 10/02/2016  . Constipation 10/02/2016  . Left lower quadrant pain   . Coagulopathy ([place])   . Heme positive stool   . Symptomatic anemia 07/10/2016  . Syncope 05/15/2015  . Cervical disc disease 05/15/2015  . Abnormal urinalysis 05/15/2015  . Anemia 05/15/2015  . History of CVA (cerebrovascular accident) 04/03/2014  . Near syncope 02/15/2014  . Pre-syncope 02/15/2014  . Benign neoplasm of rectum and anal canal 12/02/2013  . Upper GI bleeding 11/30/2013  . Tobacco use disorder 11/30/2013  . Anticoagulation goal of INR 2 to  3, for PAF - CHA2DS2Vasc = 7 08/15/2013  . Type 2 diabetes mellitus ([place]) 01/22/2013  . Tobacco abuse counseling 12/15/2012  . Hyperlipidemia   . Obesity (BMI 30-39.9) 12/14/2012  . Persistent atrial fibrillation ([place]) 10/07/2010  . Atrial flutter - status post CTI ablation 07/29/2010  . Essential hypertension 07/29/2010  . Chronic diastolic congestive heart failure ([place]) 07/29/2010  . Coronary artery disease, non-occlusive 07/29/2010    Past Surgical History:  Procedure Laterality Date  . ATRIAL ABLATION SURGERY  08/05/10   CTI ablation for atrial flutter by JA  . CARDIAC CATHETERIZATION  2012   nl LV function, no occlusive CAD, PAF  .  CARDIOVERSION  12/07/2010    Successful direct current cardioversion with atrial fibrillation to normal sinus rhythm  . CARPAL TUNNEL RELEASE Bilateral 01/30/2014   Procedure: BILATERAL CARPAL TUNNEL RELEASE;  Surgeon: Marianna Payment, MD;  Location: [facility];  Service: Orthopedics;  Laterality: Bilateral;  . CATARACT EXTRACTION W/ INTRAOCULAR LENS  IMPLANT, BILATERAL Bilateral   . COLONOSCOPY [name] 12/02/2013   Procedure: COLONOSCOPY;  Surgeon: Irene Shipper, MD;  Location: [facility];  Service: Endoscopy;  Laterality: [name];  . ESOPHAGOGASTRODUODENOSCOPY [name] 09/22/2014   Procedure: ESOPHAGOGASTRODUODENOSCOPY (EGD);  Surgeon: Ronald Lobo, MD;  Location[facility] ENDOSCOPY;  Service: Endoscopy;  Laterality: [name];  . ESOPHAGOGASTRODUODENOSCOPY [name] 07/11/2016   Procedure: ESOPHAGOGASTRODUODENOSCOPY (EGD);  Surgeon: Doran Stabler, MD;  Location: [facility] ENDOSCOPY;  Service: Endoscopy;  Laterality: [name];  . EXTRACORPOREAL SHOCK WAVE LITHOTRIPSY Right 07/15/2018   Procedure: EXTRACORPOREAL SHOCK WAVE LITHOTRIPSY (ESWL);  Surgeon: Lucas Mallow, MD;  Location: [facility] ORS;  Service: Urology;  Laterality: Right;  . INCISION AND DRAINAGE ABSCESS / HEMATOMA OF BURSA / KNEE / THIGH Left 1998   knee  . KNEE BURSECTOMY Left 1998  . LAPAROSCOPIC CHOLECYSTECTOMY  03/2010  . LEFT HEART CATH AND CORONARY ANGIOGRAPHY [name] 10/05/2017   Procedure: LEFT HEART CATH AND CORONARY ANGIOGRAPHY;  Surgeon: Martinique, Peter M, MD;  Location: [facility] CV LAB;  Service: Cardiovascular;  Laterality: [name];  . NM MYOVIEW LTD  07/22/2013   Normal EF ~60%, no ischemia or infarction.  [name] PARTIAL GASTRECTOMY  1982   subtotal; "took out 30% for ulcers"  . TRANSTHORACIC ECHOCARDIOGRAM  02/16/2014   EF 60%, no RWMA. - otherwise normal  . YAG LASER APPLICATION Bilateral         Home Medications    Prior to Admission medications   Medication Sig Start Date End Date Taking? Authorizing Provider  albuterol (PROVENTIL HFA;VENTOLIN HFA) 108 (90  Base) MCG/ACT inhaler Inhale 2 puffs into the lungs every 4 (four) hours as needed for wheezing or shortness of breath. 10/30/17   Palumbo, April, MD  aspirin EC 81 MG tablet Take 81 mg by mouth daily.    [provider]  celecoxib (CELEBREX) 200 MG capsule Take 1 capsule (200 mg total) by mouth 2 (two) times daily. 07/17/18   Isla Pence, MD  citalopram (CELEXA) 20 MG tablet Take 1 tablet (20 mg total) by mouth daily. 06/17/18   Scot Jun, FNP  co-enzyme Q-10 30 MG capsule Take 30 mg by mouth daily.     [provider]  colchicine 0.6 MG tablet Take 2 tablets initially.  Take another tablet after 1 hour is pain is still there.  Then take 1 tablet once or twice a day until pain resolves. 07/17/18   Isla Pence, MD  diclofenac sodium (VOLTAREN) 1 % GEL Apply 4 g topically 3 (  three) times daily as needed (for back pain). Patient taking differently: Apply 4 g topically 2 (two) times daily as needed (for back pain).  11/10/17   Rory Percy, DO  diltiazem (CARDIZEM CD) 360 MG 24 hr capsule Take 1 capsule (360 mg total) by mouth daily. 05/22/18   Camnitz, Will Hassell Done, MD  ferrous sulfate 325 (65 FE) MG tablet TAKE 1 TABLET BY MOUTH EVERY DAY 04/15/18   Camnitz, Ocie Doyne, MD  glucose blood (PRECISION QID TEST) test strip Check glucose twice daily 10/24/13   [provider]  magnesium hydroxide (DULCOLAX MILK OF MAGNESIA) 400 MG/5ML suspension Take 5 mLs by mouth daily as needed for mild constipation.    [provider]  Melatonin 3 MG TABS Take 1 tablet (3 mg total) by mouth at bedtime. 11/16/17   Anderson, Chelsey L, DO  nitroGLYCERIN (NITROSTAT) 0.4 MG SL tablet Place 1 tablet (0.4 mg total) under the tongue every 5 (five) minutes x 3 doses as needed for chest pain. 11/19/15   Erlene Quan, PA-C  oxyCODONE-acetaminophen (PERCOCET/ROXICET) 5-325 MG tablet Take 1-2 tablets by mouth every 4 (four) hours as needed for severe pain. 07/15/18   Lucas Mallow,  MD  pantoprazole (PROTONIX) 40 MG tablet Take 1 tablet (40 mg total) by mouth 2 (two) times daily before a meal. 11/30/16   Domenic Polite, MD  potassium chloride (K-DUR) 10 MEQ tablet Take 2 tablets (20 mEq total) by mouth daily for 2 days. 04/13/18 04/15/18  Nuala Alpha A, PA-C  pravastatin (PRAVACHOL) 40 MG tablet Take 1 tablet (40 mg total) by mouth every evening. 05/04/17   Leonie Man, MD  predniSONE (STERAPRED UNI-PAK 21 TAB) 10 MG (21) TBPK tablet Take 6 tabs for 2 days, then 5 for 2 days, then 4 for 2 days, then 3 for 2 days, 2 for 2 days, then 1 for 2 days 07/17/18   Isla Pence, MD  sulfamethoxazole-trimethoprim (BACTRIM DS,SEPTRA DS) 800-160 MG tablet Take 1 tablet by mouth 2 (two) times daily for 7 days. 07/10/18 07/17/18  Lacretia Leigh, MD  tamsulosin (FLOMAX) 0.4 MG CAPS capsule Take 0.4 mg by mouth 2 (two) times daily. 05/03/18   [provider]  tamsulosin (FLOMAX) 0.4 MG CAPS capsule Take 1 capsule (0.4 mg total) by mouth daily. 07/10/18   Lacretia Leigh, MD  vitamin C (ASCORBIC ACID) 250 MG tablet Take 1 tablet (250 mg total) by mouth daily. 01/20/18   Rise Mu, PA-C    Family History Family History  Problem Relation Age of Onset  . Breast cancer Mother   . Diabetes Mother   . Kidney disease Mother   . Heart attack Father   . Heart disease Father   . Heart attack Brother   . Leukemia Daughter     Social History Social History   Tobacco Use  . Smoking status: Current Every Day Smoker    Packs/day: 0.25    Years: 57.00    Pack years: 14.25    Types: Cigarettes  . Smokeless tobacco: Never Used  . Tobacco comment: He's been smoking between 0.5 and 2 ppd since age 70.  Substance Use Topics  . Alcohol use: No    Alcohol/week: 0.0 standard drinks    Comment: "quit drinking in 1986"  . Drug use: No     Allergies   Cyclobenzaprine   Review of Systems Review of Systems  Musculoskeletal:       Left hand pain and swelling  All  other systems  reviewed and are negative.    Physical Exam Updated Vital Signs BP 117/85 (BP Location: Right Arm)   Pulse (!) 103   Temp 98 F (36.7 C) (Oral)   Resp 16   Ht 5\' 6"  (1.676 m)   Wt 73.8 kg   SpO2 99%   BMI 26.26 kg/m   Physical Exam Vitals signs and nursing note reviewed.  Constitutional:      Appearance: Normal appearance.  HENT:     Head: Normocephalic and atraumatic.     Right Ear: External ear normal.     Left Ear: External ear normal.     Nose: Nose normal.     Mouth/Throat:     Mouth: Mucous membranes are moist.  Eyes:     Extraocular Movements: Extraocular movements intact.     Pupils: Pupils are equal, round, and reactive to light.  Neck:     Musculoskeletal: Neck supple.  Cardiovascular:     Rate and Rhythm: Normal rate and regular rhythm.     Pulses: Normal pulses.     Heart sounds: Normal heart sounds.  Pulmonary:     Effort: Pulmonary effort is normal.  Abdominal:     General: Abdomen is flat.     Palpations: Abdomen is soft.  Musculoskeletal:     Left hand: He exhibits tenderness and swelling.     Comments: See picture  Skin:    Comments: A little redness over 3rd mcp joint.?gout  Neurological:     General: No focal deficit present.     Mental Status: He is alert and oriented to person, place, and time.  Psychiatric:        Mood and Affect: Mood normal.        Behavior: Behavior normal.      ED Treatments / Results  Labs (all labs ordered are listed, but only abnormal results are displayed) Labs Reviewed - No data to display    EKG None  Radiology Dg Abd 1 View  Result Date: 07/15/2018 CLINICAL DATA:  78 year old male undergoing preoperative evaluation prior to lithotripsy for right-sided nephrolithiasis EXAM: ABDOMEN - 1 VIEW COMPARISON:  Prior KUB 07/12/2018; prior CT abdomen/pelvis 07/10/2018 FINDINGS: Stable size and position of approximately 1 cm stone in the mid right ureter at the level of L4-L5. A 3 mm stone overlying the  interpolar collecting system of the left renal shadow is also stable. Surgical clips in the right upper quadrant suggest prior cholecystectomy. The bowel gas pattern is unremarkable. No acute osseous abnormality. IMPRESSION: 1. Stable size and appearance of right ureteral and left renal stones. Electronically Signed   By: Jacqulynn Cadet M.D.   On: 07/15/2018 10:57   Dg Hand Complete Left  Result Date: 07/17/2018 CLINICAL DATA:  78 year old male with pain and swelling in the left hand. Fall 2 weeks ago. Bruising at that time which has resolved. EXAM: LEFT HAND - COMPLETE 3+ VIEW COMPARISON:  Left wrist series 07/21/2014. FINDINGS: Distal radius and ulna appear stable and intact. There is radiocarpal joint space loss. There is joint space loss between the scaphoid and lunate. There is joint space loss with subchondral sclerosis at the 2nd MCP joint. No periarticular erosions are identified. The ulnar styloid is intact. Superimposed IP joint space loss with osteophytosis, the 4th IP joints are least affected. No acute osseous abnormality identified. Generalized soft tissue swelling. IMPRESSION: Multifocal chronic arthritis in the left hand and wrist appears progressed since 2016. Generalized soft tissue swelling. No acute osseous  abnormality identified. Electronically Signed   By: Genevie Ann M.D.   On: 07/17/2018 10:06    Procedures Procedures (including critical care time)  Medications Ordered in ED Medications  dexamethasone (DECADRON) injection 10 mg (has no administration in time range)  morphine 4 MG/ML injection 4 mg (has no administration in time range)     Initial Impression / Assessment and Plan / ED Course  I have reviewed the triage vital signs and the nursing notes.  Pertinent labs & imaging results that were available during my care of the patient were reviewed by me and considered in my medical decision making (see chart for details).    No fx.  Pt likely has gout.  Doubt  cellulitis.  Pt given a dose of decadron and morphine in ED.  Pt will be d/c home with prednisone, colchicine, and celebrex.  Return if worse.  Final Clinical Impressions(s) / ED Diagnoses   Final diagnoses:  Acute gout of left hand, unspecified cause    ED Discharge Orders         Ordered    predniSONE (STERAPRED UNI-PAK 21 TAB) 10 MG (21) TBPK tablet     07/17/18 1020    colchicine 0.6 MG tablet     07/17/18 1020    celecoxib (CELEBREX) 200 MG capsule  2 times daily     07/17/18 1020           Isla Pence, MD 07/17/18 1021

## 2018-07-20 ENCOUNTER — Emergency Department (HOSPITAL_COMMUNITY)
Admission: EM | Admit: 2018-07-20 | Discharge: 2018-07-20 | Disposition: A | Discharge status: Home / Self Care | Payer: Medicare Other | Attending: Emergency Medicine | Admitting: Emergency Medicine

## 2018-07-20 ENCOUNTER — Encounter (HOSPITAL_COMMUNITY): Payer: Self-pay | Admitting: Emergency Medicine

## 2018-07-20 DIAGNOSIS — R609 Edema, unspecified: Secondary | ICD-10-CM | POA: Diagnosis not present

## 2018-07-20 DIAGNOSIS — I5032 Chronic diastolic (congestive) heart failure: Secondary | ICD-10-CM | POA: Insufficient documentation

## 2018-07-20 DIAGNOSIS — S40022A Contusion of left upper arm, initial encounter: Secondary | ICD-10-CM | POA: Diagnosis not present

## 2018-07-20 DIAGNOSIS — E119 Type 2 diabetes mellitus without complications: Secondary | ICD-10-CM | POA: Diagnosis not present

## 2018-07-20 DIAGNOSIS — F17210 Nicotine dependence, cigarettes, uncomplicated: Secondary | ICD-10-CM | POA: Insufficient documentation

## 2018-07-20 DIAGNOSIS — I110 Hypertensive heart disease with heart failure: Secondary | ICD-10-CM | POA: Diagnosis not present

## 2018-07-20 DIAGNOSIS — X58XXXA Exposure to other specified factors, initial encounter: Secondary | ICD-10-CM | POA: Insufficient documentation

## 2018-07-20 DIAGNOSIS — Z8673 Personal history of transient ischemic attack (TIA), and cerebral infarction without residual deficits: Secondary | ICD-10-CM | POA: Insufficient documentation

## 2018-07-20 DIAGNOSIS — Z79899 Other long term (current) drug therapy: Secondary | ICD-10-CM | POA: Insufficient documentation

## 2018-07-20 DIAGNOSIS — Y939 Activity, unspecified: Secondary | ICD-10-CM | POA: Insufficient documentation

## 2018-07-20 DIAGNOSIS — W19XXXA Unspecified fall, initial encounter: Secondary | ICD-10-CM | POA: Diagnosis not present

## 2018-07-20 DIAGNOSIS — Y999 Unspecified external cause status: Secondary | ICD-10-CM | POA: Insufficient documentation

## 2018-07-20 DIAGNOSIS — J449 Chronic obstructive pulmonary disease, unspecified: Secondary | ICD-10-CM | POA: Diagnosis not present

## 2018-07-20 DIAGNOSIS — I2510 Atherosclerotic heart disease of native coronary artery without angina pectoris: Secondary | ICD-10-CM | POA: Insufficient documentation

## 2018-07-20 DIAGNOSIS — Y929 Unspecified place or not applicable: Secondary | ICD-10-CM | POA: Diagnosis not present

## 2018-07-20 NOTE — ED Triage Notes (Signed)
Patient arrives via [facility] from home for c/o purple bruise to Left forearm that he noticed today. Pulses intact. Patient reports a fall 3 weeks ago with pain and swelling to hand but was unaware of any injury to forearm. Patient denies pain

## 2018-07-20 NOTE — Discharge Instructions (Addendum)
Discoloration and swelling of the right arm, appears to be from a hematoma.  There are no signs of infection, blood clot and pain, arm fracture or complications from gout.  To treat this discomfort and swelling, elevate your left arm above your heart as much as possible and use heat on the area 3 or 4 times a day for 30-minute.  If the area becomes more painful, swelling more, increased redness or drainage, return here for reevaluation.

## 2018-07-20 NOTE — ED Provider Notes (Signed)
[facility] EMERGENCY DEPARTMENT Provider Note   CSN: [id number] Arrival date & time:       History   Chief Complaint Chief Complaint  Patient presents with   Arm Pain    HPI [name] is a 78 y.o. male.     HPI   He presents for evaluation of left forearm, present since morning.  No known trauma, recently.  He did have a fall 3 weeks ago.  He was evaluated here 3 days ago, at that time diagnosed with gout left wrist and left hand.  He was prescribed colchicine and prednisone but decided not to take it after a pharmacist told him it might adversely affect 1 of his medications.  Extensive discomfort in the left wrist and hand has almost completely resolved.  He denies headache, weakness or dizziness.  He presents by EMS, the paramedic drew lines around the area of concern the left forearm.  There are no other known modifying factors. Past Medical History:  Diagnosis Date   Allergic rhinitis    Anemia    takes Ferrous Sulfate daily   Arthritis    "all over"   Atrial flutter ([place])    Balance problem 01/2014   CAP (community acquired pneumonia) 09/18/2014   Cervical radiculopathy due to degenerative joint disease of spine    Chronic coronary artery disease    a. 03/2010 Nonocclusive disease by cath, performed for ST elevations on ECG;  b. 06/2013 Lexi MV: EF 60%, no ischemia.   COPD (chronic obstructive pulmonary disease) (HCC)    Diabetes mellitus type II    takes Metformin and Lantus daily   Diastolic CHF, chronic ([place])    a. 12/2012 EF 55-60%, diast dysfxn, triv MR, mildly dil LA/RA.   Dyspnea    with pain   Essential hypertension    takes Diltiazem daily   Frail elderly    GERD (gastroesophageal reflux disease)    History of blood transfusion 1982   "when I had stomach OR"   History of bronchitis    1998   History of CVA (cerebrovascular accident)    History of gastric ulcer    History of kidney stones     Hypercholesterolemia    takes Pravastatin daily   Intrinsic eczema    Legally blind in left eye, as defined in [place]    Obesity    PAF (paroxysmal atrial fibrillation) ([place])    Recurrent after atrial flutter (a. 07/2010 Status post caval tricuspid isthmus ablation by Dr. Midge Aver Metoprolol daily), currently controlled on flecainide plus diltiazem and Coumadin   Urinary retention    Vitamin D deficiency    Weakness    numbness and tingling both hands    Patient Active Problem List   Diagnosis Date Noted   Major depressive disorder, recurrent severe without psychotic features ([place]) 04/15/2018   Depression    Rapid atrial fibrillation ([place]) 01/18/2018   Atrial fibrillation with RVR ([place]) 11/08/2017   Chest pain with moderate risk of acute coronary syndrome 10/04/2017   HTN (hypertension) 05/21/2017   Diabetes mellitus type II 15/07/6977   Diastolic CHF, chronic ([place]) 05/21/2017   COPD (chronic obstructive pulmonary disease) ([place]) 05/21/2017   H/O atrial flutter 05/21/2017   Coronary disease/nonobstructive 05/21/2017   Acute blood loss anemia 10/02/2016   Abdominal pain 10/02/2016   Constipation 10/02/2016   Left lower quadrant pain    Coagulopathy (HCC)    Heme positive stool    Symptomatic anemia 07/10/2016   Syncope  05/15/2015   Cervical disc disease 05/15/2015   Abnormal urinalysis 05/15/2015   Anemia 05/15/2015   History of CVA (cerebrovascular accident) 04/03/2014   Near syncope 02/15/2014   Pre-syncope 02/15/2014   Benign neoplasm of rectum and anal canal 12/02/2013   Upper GI bleeding 11/30/2013   Tobacco use disorder 11/30/2013   Anticoagulation goal of INR 2 to 3, for PAF - CHA2DS2Vasc = 7 08/15/2013   Type 2 diabetes mellitus ([place]) 01/22/2013   Tobacco abuse counseling 12/15/2012   Hyperlipidemia    Obesity (BMI 30-39.9) 12/14/2012   Persistent atrial fibrillation ([place]) 10/07/2010   Atrial flutter - status post CTI  ablation 07/29/2010   Essential hypertension 07/29/2010   Chronic diastolic congestive heart failure ([place]) 07/29/2010   Coronary artery disease, non-occlusive 07/29/2010    Past Surgical History:  Procedure Laterality Date   ATRIAL ABLATION SURGERY  08/05/10   CTI ablation for atrial flutter by [facility]  2012   nl LV function, no occlusive CAD, PAF   CARDIOVERSION  12/07/2010    Successful direct current cardioversion with atrial fibrillation to normal sinus rhythm   CARPAL TUNNEL RELEASE Bilateral 01/30/2014   Procedure: BILATERAL CARPAL TUNNEL RELEASE;  Surgeon: Marianna Payment, MD;  Location: [facility];  Service: Orthopedics;  Laterality: Bilateral;   CATARACT EXTRACTION W/ INTRAOCULAR LENS  IMPLANT, BILATERAL Bilateral    COLONOSCOPY [name] 12/02/2013   Procedure: COLONOSCOPY;  Surgeon: Irene Shipper, MD;  Location: [facility];  Service: Endoscopy;  Laterality: [name];   ESOPHAGOGASTRODUODENOSCOPY [name] 09/22/2014   Procedure: ESOPHAGOGASTRODUODENOSCOPY (EGD);  Surgeon: Ronald Lobo, MD;  Location: [facility] ENDOSCOPY;  Service: Endoscopy;  Laterality: [name];   ESOPHAGOGASTRODUODENOSCOPY [name] 07/11/2016   Procedure: ESOPHAGOGASTRODUODENOSCOPY (EGD);  Surgeon: Doran Stabler, MD;  Location: [facility] ENDOSCOPY;  Service: Endoscopy;  Laterality: [name];   EXTRACORPOREAL SHOCK WAVE LITHOTRIPSY Right 07/15/2018   Procedure: EXTRACORPOREAL SHOCK WAVE LITHOTRIPSY (ESWL);  Surgeon: Lucas Mallow, MD;  Location: [facility];  Service: Urology;  Laterality: Right;   INCISION AND DRAINAGE ABSCESS / HEMATOMA OF BURSA / KNEE / THIGH Left 1998   knee   KNEE BURSECTOMY Left 1998   LAPAROSCOPIC CHOLECYSTECTOMY  03/2010   LEFT HEART CATH AND CORONARY ANGIOGRAPHY [name] 10/05/2017   Procedure: LEFT HEART CATH AND CORONARY ANGIOGRAPHY;  Surgeon: Martinique, Peter M, MD;  Location: [facility] CV LAB;  Service: Cardiovascular;  Laterality: [name];   NM MYOVIEW LTD  07/22/2013   Normal EF ~60%, no ischemia  or infarction.   PARTIAL GASTRECTOMY  1982   subtotal; "took out 30% for ulcers"   TRANSTHORACIC ECHOCARDIOGRAM  02/16/2014   EF 60%, no RWMA. - otherwise normal   YAG LASER APPLICATION Bilateral         Home Medications    Prior to Admission medications   Medication Sig Start Date End Date Taking? Authorizing Provider  albuterol (PROVENTIL HFA;VENTOLIN HFA) 108 (90 Base) MCG/ACT inhaler Inhale 2 puffs into the lungs every 4 (four) hours as needed for wheezing or shortness of breath. 10/30/17   Palumbo, April, MD  celecoxib (CELEBREX) 200 MG capsule Take 1 capsule (200 mg total) by mouth 2 (two) times daily. 07/17/18   Isla Pence, MD  citalopram (CELEXA) 20 MG tablet Take 1 tablet (20 mg total) by mouth daily. 06/17/18   Scot Jun, FNP  co-enzyme Q-10 30 MG capsule Take 30 mg by mouth daily.     [provider]  colchicine 0.6 MG tablet Take 2  tablets initially.  Take another tablet after 1 hour is pain is still there.  Then take 1 tablet once or twice a day until pain resolves. 07/17/18   Isla Pence, MD  diclofenac sodium (VOLTAREN) 1 % GEL Apply 4 g topically 3 (three) times daily as needed (for back pain). Patient taking differently: Apply 4 g topically 2 (two) times daily as needed (for back pain).  11/10/17   Rory Percy, DO  diltiazem (CARDIZEM CD) 360 MG 24 hr capsule Take 1 capsule (360 mg total) by mouth daily. 05/22/18   Camnitz, Ocie Doyne, MD  ferrous sulfate 325 (65 FE) MG tablet TAKE 1 TABLET BY MOUTH EVERY DAY Patient taking differently: Take 325 mg by mouth daily with breakfast.  04/15/18   Camnitz, Ocie Doyne, MD  glucose blood (PRECISION QID TEST) test strip Check glucose twice daily 10/24/13   [provider]  Melatonin 3 MG TABS Take 1 tablet (3 mg total) by mouth at bedtime. Patient not taking: Reported on 07/17/2018 11/16/17   Doristine Mango L, DO  nitroGLYCERIN (NITROSTAT) 0.4 MG SL tablet Place 1 tablet (0.4 mg total) under the  tongue every 5 (five) minutes x 3 doses as needed for chest pain. 11/19/15   Erlene Quan, PA-C  oxyCODONE-acetaminophen (PERCOCET/ROXICET) 5-325 MG tablet Take 1-2 tablets by mouth every 4 (four) hours as needed for severe pain. 07/15/18   Lucas Mallow, MD  pantoprazole (PROTONIX) 40 MG tablet Take 1 tablet (40 mg total) by mouth 2 (two) times daily before a meal. 11/30/16   Domenic Polite, MD  potassium chloride (K-DUR) 10 MEQ tablet Take 2 tablets (20 mEq total) by mouth daily for 2 days. Patient not taking: Reported on 07/17/2018 04/13/18 04/15/18  Nuala Alpha A, PA-C  pravastatin (PRAVACHOL) 40 MG tablet Take 1 tablet (40 mg total) by mouth every evening. 05/04/17   Leonie Man, MD  predniSONE (STERAPRED UNI-PAK 21 TAB) 10 MG (21) TBPK tablet Take 6 tabs for 2 days, then 5 for 2 days, then 4 for 2 days, then 3 for 2 days, 2 for 2 days, then 1 for 2 days 07/17/18   Isla Pence, MD  tamsulosin (FLOMAX) 0.4 MG CAPS capsule Take 0.4 mg by mouth 2 (two) times daily. 05/03/18   [provider]  tamsulosin (FLOMAX) 0.4 MG CAPS capsule Take 1 capsule (0.4 mg total) by mouth daily. 07/10/18   Lacretia Leigh, MD  vitamin C (ASCORBIC ACID) 250 MG tablet Take 1 tablet (250 mg total) by mouth daily. 01/20/18   Dunn, Areta Haber, PA-C    Family History Family History  Problem Relation Age of Onset   Breast cancer Mother    Diabetes Mother    Kidney disease Mother    Heart attack Father    Heart disease Father    Heart attack Brother    Leukemia Daughter     Social History Social History   Tobacco Use   Smoking status: Current Every Day Smoker    Packs/day: 0.25    Years: 57.00    Pack years: 14.25    Types: Cigarettes   Smokeless tobacco: Never Used   Tobacco comment: He's been smoking between 0.5 and 2 ppd since age 50.  Substance Use Topics   Alcohol use: No    Alcohol/week: 0.0 standard drinks    Comment: "quit drinking in 1986"   Drug use: No      Allergies   Cyclobenzaprine   Review of Systems Review  of Systems  All other systems reviewed and are negative.    Physical Exam Updated Vital Signs BP (!) 143/72 (BP Location: Right Arm)    Pulse 95    Temp 97.9 F (36.6 C) (Oral)    Resp 16    SpO2 100%   Physical Exam Vitals signs and nursing note reviewed.  Constitutional:      General: He is not in acute distress.    Appearance: Normal appearance. He is well-developed. He is not ill-appearing, toxic-appearing or diaphoretic.  HENT:     Head: Normocephalic and atraumatic.     Right Ear: External ear normal.     Left Ear: External ear normal.  Eyes:     Conjunctiva/sclera: Conjunctivae normal.     Pupils: Pupils are equal, round, and reactive to light.  Neck:     Musculoskeletal: Normal range of motion and neck supple.     Trachea: Phonation normal.  Cardiovascular:     Rate and Rhythm: Normal rate.  Pulmonary:     Effort: Pulmonary effort is normal.  Abdominal:     Palpations: Abdomen is soft.  Musculoskeletal: Normal range of motion.     Comments: Left lower forearm with bruising, flat, associated with an area of induration about 2 x 2 cm.  The indurated area is nontender and there is no drainage.  Bruising is approximately 8 x 4 cm.  Neurovascular intact distally in the forearm.  Normal range of motion left shoulder, left elbow, left wrist and left hand.  Swelling of the left hand.  This appears improved as compared to the image in the chart 07/17/2018.  Skin:    General: Skin is warm and dry.  Neurological:     Mental Status: He is alert and oriented to person, place, and time.     Cranial Nerves: No cranial nerve deficit.     Sensory: No sensory deficit.     Motor: No abnormal muscle tone.     Coordination: Coordination normal.  Psychiatric:        Behavior: Behavior normal.        Thought Content: Thought content normal.        Judgment: Judgment normal.      ED Treatments / Results  Labs (all  labs ordered are listed, but only abnormal results are displayed) Labs Reviewed - No data to display  EKG None  Radiology No results found.  Procedures Procedures (including critical care time)  Medications Ordered in ED Medications - No data to display   Initial Impression / Assessment and Plan / ED Course  I have reviewed the triage vital signs and the nursing notes.  Pertinent labs & imaging results that were available during my care of the patient were reviewed by me and considered in my medical decision making (see chart for details).         Patient Vitals for the past 24 hrs:  BP Temp Temp src Pulse Resp SpO2  07/20/18 1050 (!) 143/72 97.9 F (36.6 C) Oral 95 16 100 %    11:25 AM Reevaluation with update and discussion. After initial assessment and treatment, an updated evaluation reveals no change in clinical status, findings discussed with the patient and all questions were answered. Daleen Bo   Medical Decision Making: Nonspecific bruising left forearm.  No complicating feature.  Doubt DVT, fracture, cellulitis.  CRITICAL CARE-none Performed by: Daleen Bo  Nursing Notes Reviewed/ Care Coordinated Applicable Imaging Reviewed Interpretation of Laboratory Data incorporated into ED treatment  The patient appears reasonably screened and/or stabilized for discharge and I doubt any other medical condition or other [facility] requiring further screening, evaluation, or treatment in the ED at this time prior to discharge.  Plan: Home Medications-usual medications; Home Treatments-, elevation, observation; return here if the recommended treatment, does not improve the symptoms; Recommended follow up-PCP or return here as needed.   Final Clinical Impressions(s) / ED Diagnoses   Final diagnoses:  Hematoma of arm, left, initial encounter    ED Discharge Orders    None       Daleen Bo, MD 07/20/18 1126

## 2018-07-29 DIAGNOSIS — N201 Calculus of ureter: Secondary | ICD-10-CM | POA: Diagnosis not present

## 2018-08-01 IMAGING — DX DG CHEST 2V
2 series · 2 of 2 positions shown · non-contrast
Comparison: March 09, 2017

CLINICAL DATA: Productive cough for 3 months

EXAM:
CHEST  2 VIEW

[chest pa]
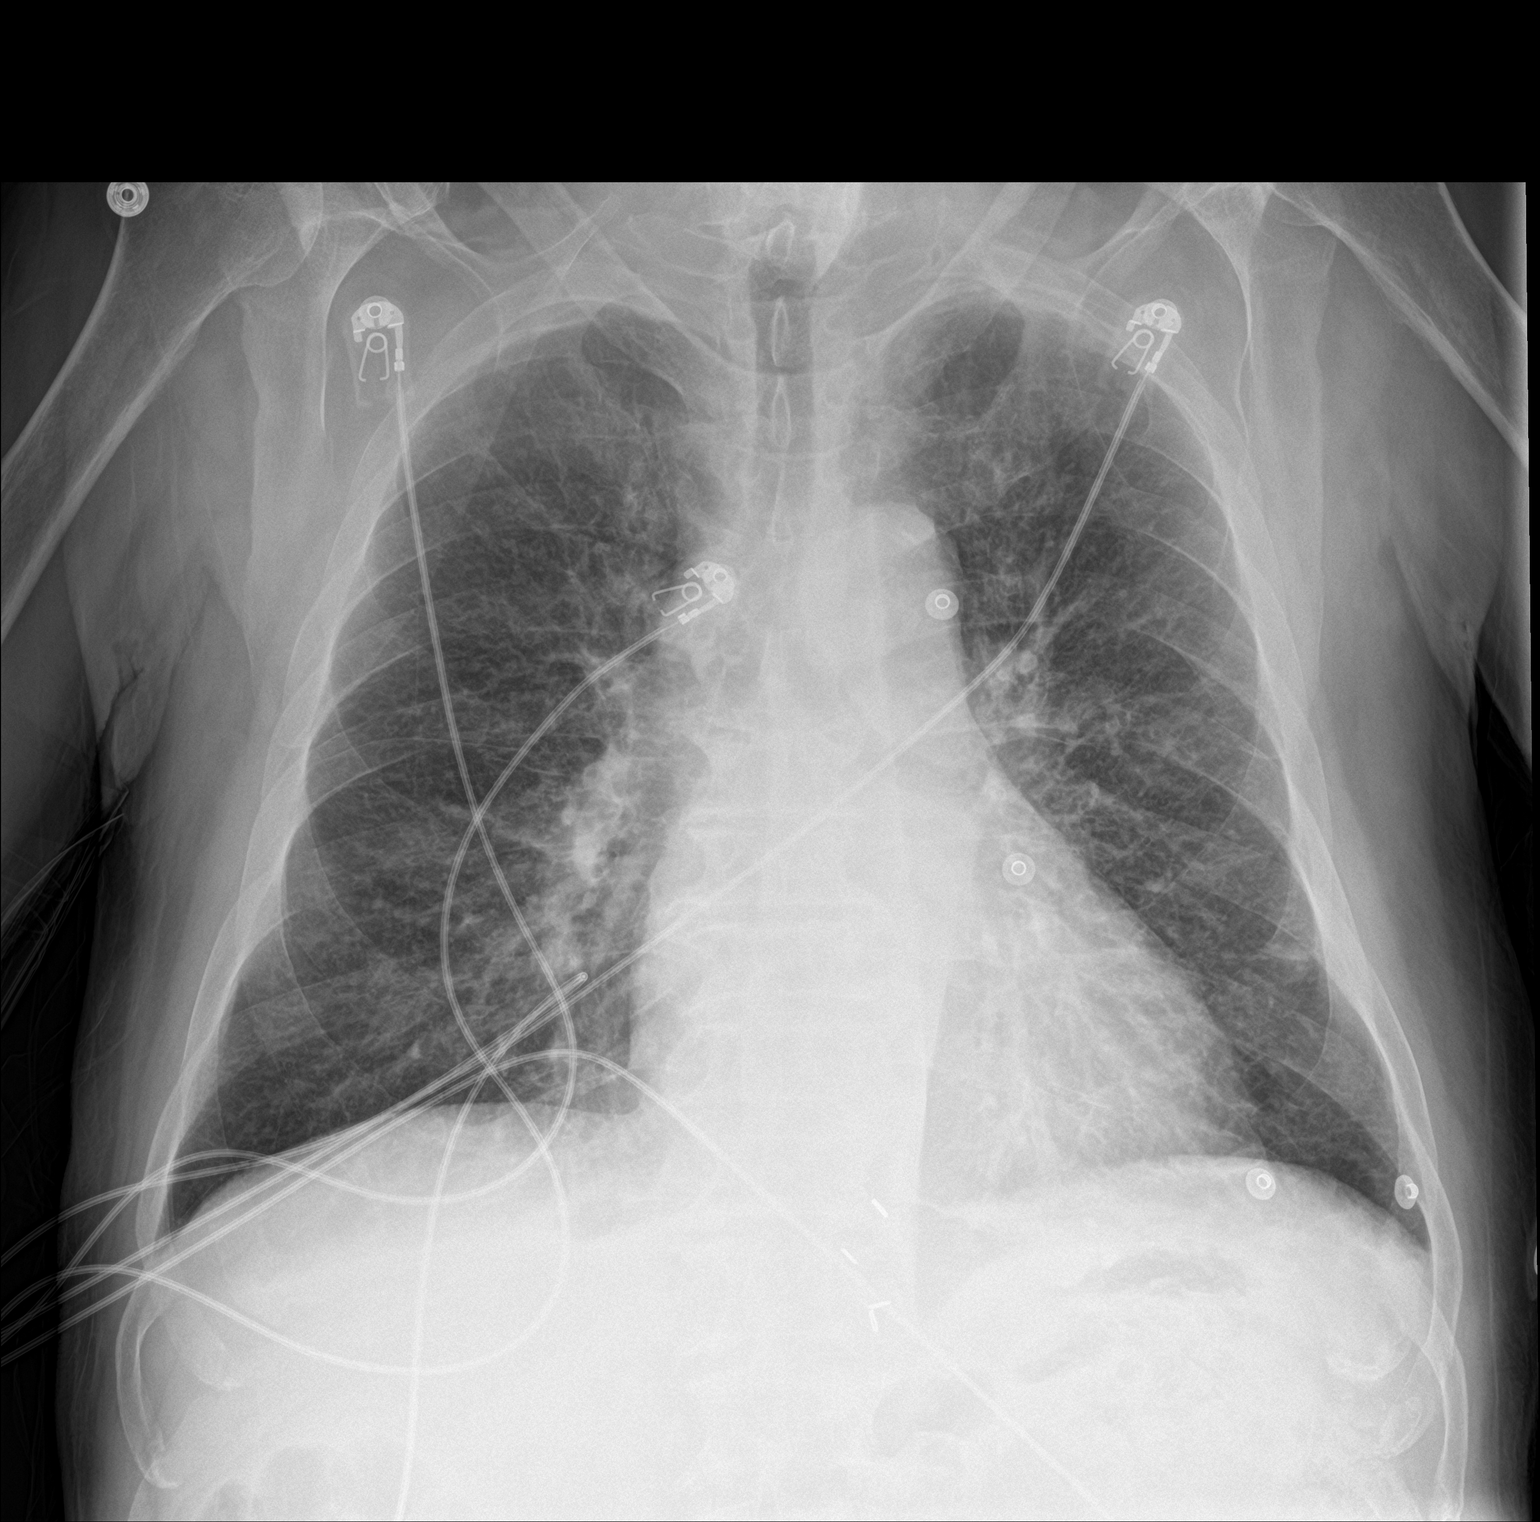

[chest lat]
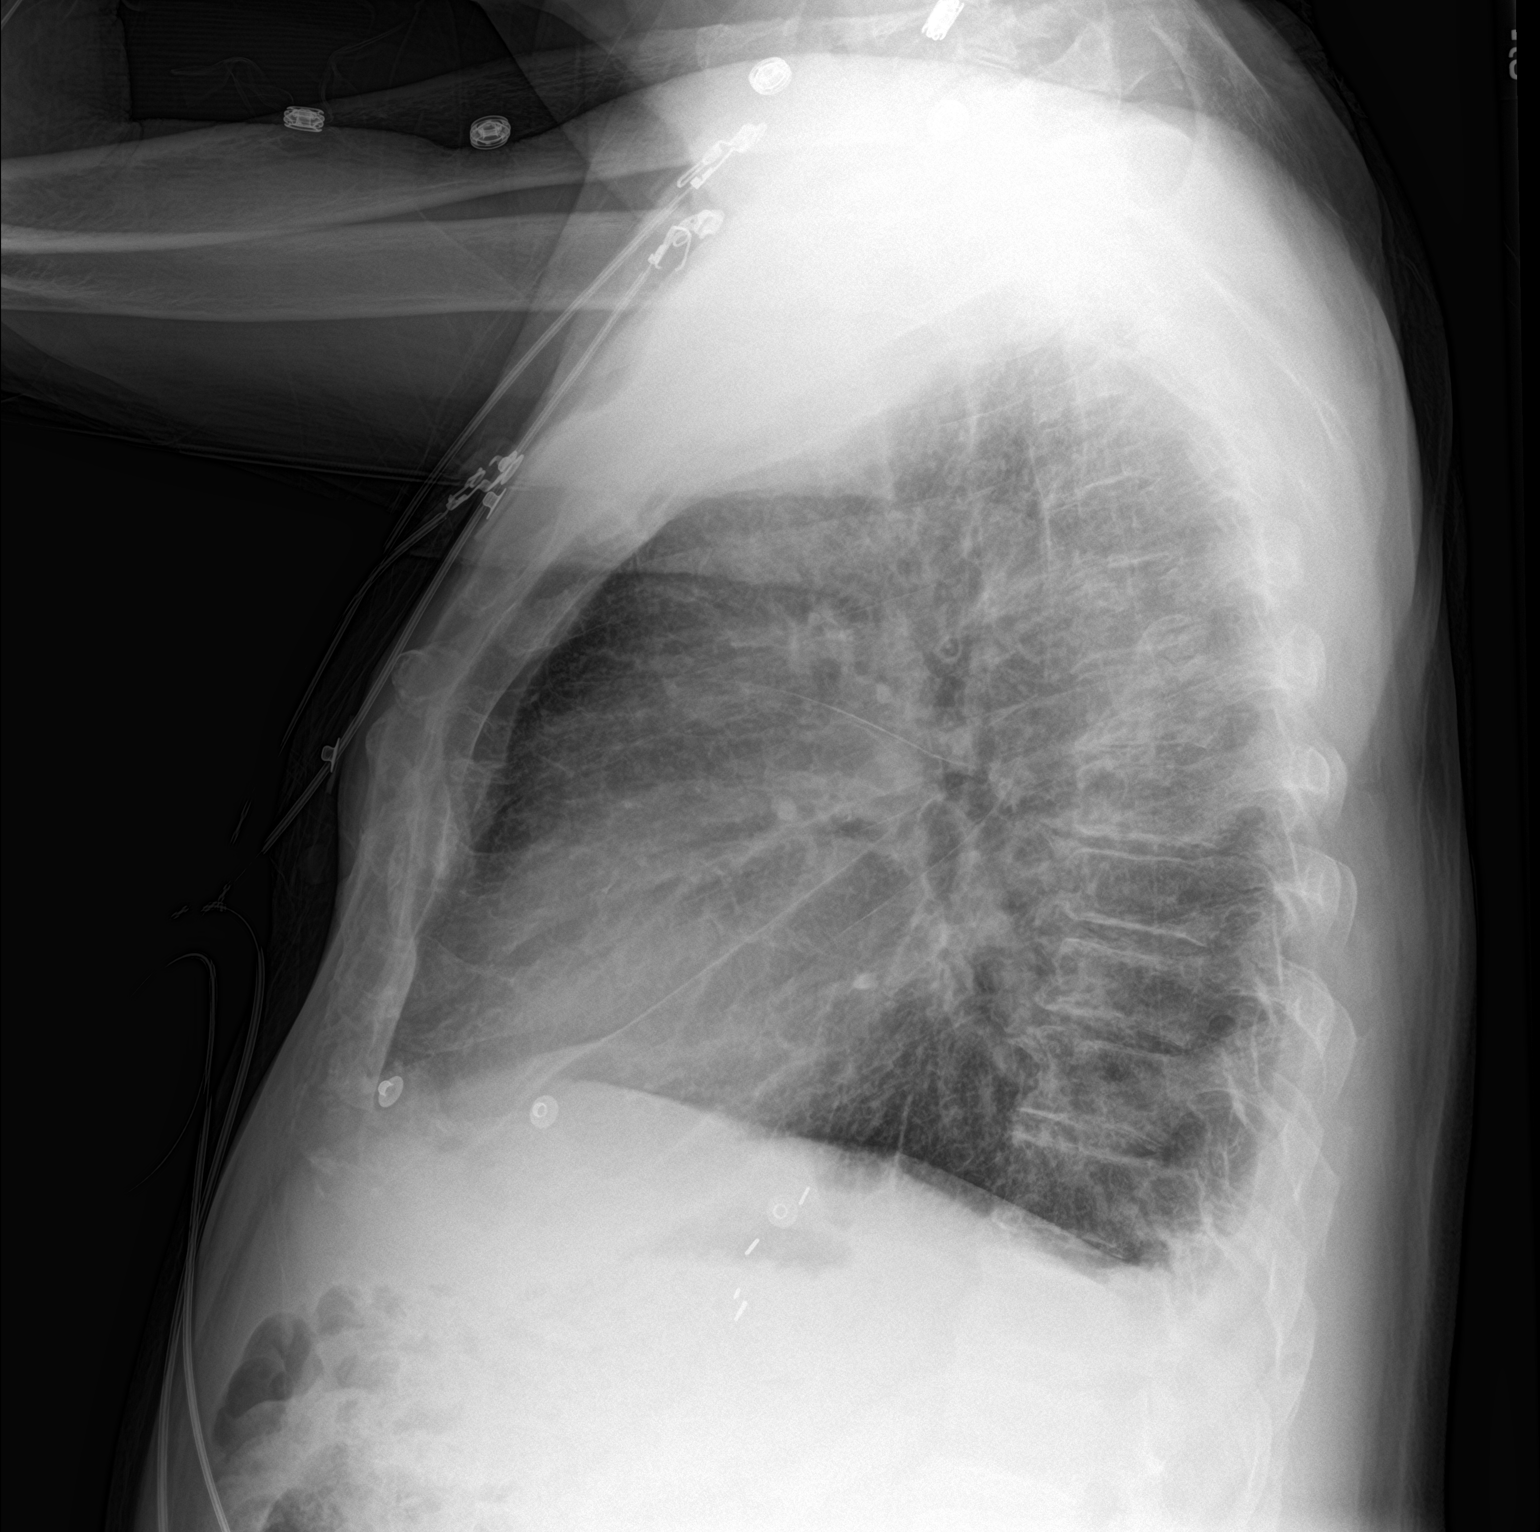

[2 of 2 positions shown; findings below may reference images not displayed]

FINDINGS: The heart size and mediastinal contours are stable. Mild increased
pulmonary interstitium is identified bilaterally. There is no focal
pneumonia. There are small bilateral pleural effusions. There is a
questioned nodularity in the left lung base. The visualized skeletal
structures are stable.
IMPRESSION: Small bilateral pleural effusions.

Mild increased pulmonary interstitium bilaterally which can be seen
in mild edema or infectious inflammatory process.

Nodularity in the lateral left lung base which may represent the
nipple. Evaluation with frontal chest x-ray with nipple marker is
recommended for confirmation.

## 2018-08-14 ENCOUNTER — Other Ambulatory Visit: Payer: Self-pay

## 2018-08-20 ENCOUNTER — Ambulatory Visit: Payer: Medicare Other | Admitting: Family Medicine

## 2018-08-20 ENCOUNTER — Other Ambulatory Visit: Payer: Self-pay

## 2018-08-20 DIAGNOSIS — R338 Other retention of urine: Secondary | ICD-10-CM | POA: Diagnosis not present

## 2018-08-20 NOTE — Progress Notes (Deleted)
Virtual Visit via Telephone Note  I connected with [name] on 08/20/18 at  8:30 AM EDT by telephone and verified that I am speaking with the correct person using two identifiers.  Location: Patient: *** Provider: ***   I discussed the limitations, risks, security and privacy concerns of performing an evaluation and management service by telephone and the availability of in person appointments. I also discussed with the patient that there may be a patient responsible charge related to this service. The patient expressed understanding and agreed to proceed.   History of Present Illness:    Observations/Objective:   Assessment and Plan:   Follow Up Instructions:    I discussed the assessment and treatment plan with the patient. The patient was provided an opportunity to ask questions and all were answered. The patient agreed with the plan and demonstrated an understanding of the instructions.   The patient was advised to call back or seek an in-person evaluation if the symptoms worsen or if the condition fails to improve as anticipated.  I provided *** minutes of non-face-to-face time during this encounter.   Molli Barrows, FNP

## 2018-08-22 ENCOUNTER — Ambulatory Visit (INDEPENDENT_AMBULATORY_CARE_PROVIDER_SITE_OTHER): Payer: Medicare Other | Admitting: Family Medicine

## 2018-08-22 ENCOUNTER — Other Ambulatory Visit: Payer: Self-pay

## 2018-08-22 DIAGNOSIS — N138 Other obstructive and reflux uropathy: Secondary | ICD-10-CM

## 2018-08-22 DIAGNOSIS — N200 Calculus of kidney: Secondary | ICD-10-CM

## 2018-08-22 DIAGNOSIS — F332 Major depressive disorder, recurrent severe without psychotic features: Secondary | ICD-10-CM | POA: Diagnosis not present

## 2018-08-22 DIAGNOSIS — K219 Gastro-esophageal reflux disease without esophagitis: Secondary | ICD-10-CM

## 2018-08-22 DIAGNOSIS — N401 Enlarged prostate with lower urinary tract symptoms: Secondary | ICD-10-CM

## 2018-08-22 MED ORDER — PANTOPRAZOLE SODIUM 40 MG PO TBEC: 40.0000 mg | DELAYED_RELEASE_TABLET | Freq: Two times a day (BID) | ORAL | 1 refills | Status: DC

## 2018-08-22 MED ORDER — PRAVASTATIN SODIUM 40 MG PO TABS: 40.0000 mg | ORAL_TABLET | Freq: Every evening | ORAL | 3 refills | Status: DC

## 2018-08-22 MED ORDER — HYDROCORTISONE 1 % EX CREA: 1.0000 "application " | TOPICAL_CREAM | Freq: Two times a day (BID) | CUTANEOUS | 1 refills | Status: DC

## 2018-08-22 NOTE — Progress Notes (Signed)
Virtual Visit via Telephone Note  I connected with [name] on 08/22/18 at 10:10 AM EDT by telephone and verified that I am speaking with the correct person using two identifiers.  Location: Patient: Located at home during today's encounter and daughter [name] is connected during call Provider: Located at primary care office    I discussed the limitations, risks, security and privacy concerns of performing an evaluation and management service by telephone and the availability of in person appointments. I also discussed with the patient that there may be a patient responsible charge related to this service. The patient expressed understanding and agreed to proceed.   Medical problems significant for the following:has Atrial flutter - status post CTI ablation; Essential hypertension; Chronic diastolic congestive heart failure ([name]); Coronary artery disease, non-occlusive; Persistent atrial fibrillation ([name]); Obesity (BMI 30-39.9); Tobacco abuse counseling; Hyperlipidemia; Type 2 diabetes mellitus ([name]); Anticoagulation goal of INR 2 to 3, for PAF - CHA2DS2Vasc = 7; Upper GI bleeding; Tobacco use disorder; Benign neoplasm of rectum and anal canal; Near syncope; Pre-syncope; History of CVA (cerebrovascular accident); Syncope; Cervical disc disease; Abnormal urinalysis; Anemia; Symptomatic anemia; Heme positive stool; Left lower quadrant pain; Coagulopathy ([name]); Acute blood loss anemia; Abdominal pain; Constipation; HTN (hypertension); Diabetes mellitus type II; Diastolic CHF, chronic (HCC); COPD (chronic obstructive pulmonary disease) ([name]); H/O atrial flutter; Coronary disease/nonobstructive; Chest pain with moderate risk of acute coronary syndrome; Atrial fibrillation with RVR ([name]); Rapid atrial fibrillation ([name]); Depression; and Major depressive disorder, recurrent severe without psychotic features ([name]).   History of Present Illness: [name] is present during today's encounter for  depression follow-up.Patient was last seen in office on 05/20/18 following an impatient behavorial health admission.During that visit she was continued on SSRI therapy and referred to behavioral health for further management. Due to COVID-19 he has not been seen by behavioral health at this time. Reports improvement of mood. Denies suicidal ideations, homicidal ideations, or auditory hallucinations.   Depression screen [facility] 2/9 08/22/2018 05/20/2018 04/04/2018  Decreased Interest 0 0 0  Down, Depressed, Hopeless 1 0 0  PHQ - 2 Score 1 0 0  Altered sleeping 1 1 3   Tired, decreased energy 1 1 2   Change in appetite 0 0 0  Feeling bad or failure about yourself  0 0 0  Trouble concentrating 0 0 0  Moving slowly or fidgety/restless 0 0 0  Suicidal thoughts 0 0 0  PHQ-9 Score 3 2 5   Some recent data might be hidden     Kidney Stones Since his last visit patient was due to right ureteral stones. He underwent lithotripsy. He is followed by Dr. Gloriann Loan, MD at [facility]. Currently is experiencing any pain. He reports during the course of finding out he had renal stone, he developed another UTI which has since resolved. Denies dysuria, urine retention, or hematuria.  Assessment and Plan: .1. Gastroesophageal reflux disease without esophagitis, medication refill - pantoprazole (PROTONIX) 40 MG tablet; Take 1 tablet (40 mg total) by mouth 2 (two) times daily before a meal.  Dispense: 180 tablet; Refill: 1  2. Major depressive disorder, recurrent severe without psychotic features ([name]), symptoms stable -Continue Celexa -Follow-up with Behavioral Health.  3. BPH with obstruction/lower urinary tract symptoms, -Continue Flomax and follow-up with urology.  4. Kidney stone on right side -Continue follow-up with urology.  Follow Up Instructions: Chronic condition follow-up 8 weeks    I discussed the assessment and treatment plan with the patient. The patient was provided an opportunity to ask questions  and all were answered. The patient agreed with the plan and demonstrated an understanding of the instructions.   The patient was advised to call back or seek an in-person evaluation if the symptoms worsen or if the condition fails to improve as anticipated.  I provided 20 minutes of non-face-to-face time during this encounter.   Molli Barrows, FNP

## 2018-08-22 NOTE — Progress Notes (Signed)
Called patient to initiate their telephone visit with provider Molli Barrows, FNP-C. Verified date of birth. States that he is doing well on the Celexa. KWalker, CMA.

## 2018-09-17 DIAGNOSIS — R338 Other retention of urine: Secondary | ICD-10-CM | POA: Diagnosis not present

## 2018-10-01 ENCOUNTER — Telehealth: Payer: Self-pay

## 2018-10-01 NOTE — Telephone Encounter (Signed)
Called patient to do their pre-visit COVID screening.  Call went to voicemail. Unable to prescreen.  

## 2018-10-02 ENCOUNTER — Encounter: Payer: Self-pay | Admitting: Family Medicine

## 2018-10-02 ENCOUNTER — Ambulatory Visit (INDEPENDENT_AMBULATORY_CARE_PROVIDER_SITE_OTHER): Payer: Medicare Other | Admitting: Family Medicine

## 2018-10-02 ENCOUNTER — Other Ambulatory Visit: Payer: Self-pay

## 2018-10-02 VITALS — BP 146/70 | HR 77 | Temp 97.5°F | Resp 18 | Ht 66.0 in | Wt 161.8 lb

## 2018-10-02 DIAGNOSIS — D649 Anemia, unspecified: Secondary | ICD-10-CM

## 2018-10-02 DIAGNOSIS — R8290 Unspecified abnormal findings in urine: Secondary | ICD-10-CM | POA: Diagnosis not present

## 2018-10-02 DIAGNOSIS — K219 Gastro-esophageal reflux disease without esophagitis: Secondary | ICD-10-CM | POA: Diagnosis not present

## 2018-10-02 DIAGNOSIS — E1100 Type 2 diabetes mellitus with hyperosmolarity without nonketotic hyperglycemic-hyperosmolar coma (NKHHC): Secondary | ICD-10-CM

## 2018-10-02 DIAGNOSIS — Z13220 Encounter for screening for lipoid disorders: Secondary | ICD-10-CM | POA: Diagnosis not present

## 2018-10-02 DIAGNOSIS — I10 Essential (primary) hypertension: Secondary | ICD-10-CM

## 2018-10-02 LAB — POCT URINALYSIS DIP (CLINITEK)
Bilirubin, UA: NEGATIVE
Glucose, UA: NEGATIVE mg/dL
Ketones, POC UA: NEGATIVE mg/dL
Nitrite, UA: POSITIVE — AB
POC PROTEIN,UA: 100 — AB
Spec Grav, UA: 1.025 (ref 1.010–1.025)
Urobilinogen, UA: 0.2 E.U./dL
pH, UA: 6.5 (ref 5.0–8.0)

## 2018-10-02 MED ORDER — DICLOFENAC SODIUM 1 % TD GEL: 4.0000 g | Freq: Three times a day (TID) | TRANSDERMAL | 2 refills | Status: DC | PRN

## 2018-10-02 MED ORDER — PRAVASTATIN SODIUM 40 MG PO TABS: 40.0000 mg | ORAL_TABLET | Freq: Every evening | ORAL | 3 refills | Status: DC

## 2018-10-02 MED ORDER — HYDROCORTISONE 1 % EX CREA: 1.0000 "application " | TOPICAL_CREAM | Freq: Two times a day (BID) | CUTANEOUS | 1 refills | Status: DC

## 2018-10-02 MED ORDER — TAMSULOSIN HCL 0.4 MG PO CAPS: 0.4000 mg | ORAL_CAPSULE | Freq: Every day | ORAL | 3 refills | Status: DC

## 2018-10-02 MED ORDER — DILTIAZEM HCL ER COATED BEADS 360 MG PO CP24: 360.0000 mg | ORAL_CAPSULE | Freq: Every day | ORAL | 2 refills | Status: DC

## 2018-10-02 MED ORDER — TAMSULOSIN HCL 0.4 MG PO CAPS: 0.4000 mg | ORAL_CAPSULE | Freq: Every day | ORAL | 0 refills | Status: DC

## 2018-10-02 MED ORDER — CITALOPRAM HYDROBROMIDE 20 MG PO TABS: 20.0000 mg | ORAL_TABLET | Freq: Every day | ORAL | 1 refills | Status: DC

## 2018-10-02 MED ORDER — PANTOPRAZOLE SODIUM 40 MG PO TBEC: 40.0000 mg | DELAYED_RELEASE_TABLET | Freq: Two times a day (BID) | ORAL | 1 refills | Status: DC

## 2018-10-02 NOTE — Progress Notes (Signed)
Patient ID: [name], male    DOB: 1940/06/24, 78 y.o.   MRN: [id number]  PCP: Scot Jun, FNP  Chief Complaint  Patient presents with  . Diabetes  . Hypertension  . Hyperlipidemia    Subjective:  HPI  [name] is a 78 y.o. male presents for evaluation diabetes, hypertension, and hyperlipidemia.  Mr. Malachi is status post LITHOTRIPSY for removal of multiple renal stones. He has a chronic urinary catheter now and is followed by Dr. Gloriann Loan, MD at [facility] and is seen  4 weeks for routine follow-up and urinary catheter changes.  Reports since renal stones have passed he is feeling great. He is active around the house, although is staying mostly in doors due to COVID-19.   Diabetes has been well controlled in past. No home monitor of blood sugar. Diabetes has been diet controlled for years. Last A1C 5.5. He was taking metformin at some point although this was discontinued. He denies any neuropathic pain, increased thirst, or increased hunger.   Hypertension, blood pressure has been well controlled. Patient suffers from atrial fibrillation which is rate controlled with Cardizem.  Cardizem has also effectively manage blood pressure. [place]oor pressure traditionally at goal however it is slightly elevated today.  Depression and Anxiety Currently managed with Celexa.  Patient has been referred and contacted by a psychiatrist here locally.  Daughter did reschedule appointment she had concern for taking patient out to the new office.  She will follow-up and see if the office will schedule patient for a video or virtual visit.  He is negative of symptoms of depression, anxiousness, or suicidal ideation.  Daughter also reports his mentation has been stable and he has not had any episodes of altered mental status or forgetfulness. Meds ordered this encounter  Medications  . DISCONTD: tamsulosin (FLOMAX) 0.4 MG CAPS capsule    Sig: Take 1 capsule (0.4 mg total) by mouth daily.    Dispense:  30 capsule    Refill:  0  . hydrocortisone cream 1 %    Sig: Apply 1 application topically 2 (two) times daily.    Dispense:  454 g    Refill:  1  . citalopram (CELEXA) 20 MG tablet    Sig: Take 1 tablet (20 mg total) by mouth daily.    Dispense:  90 tablet    Refill:  1  . diltiazem (CARDIZEM CD) 360 MG 24 hr capsule    Sig: Take 1 capsule (360 mg total) by mouth daily.    Dispense:  90 capsule    Refill:  2  . diclofenac sodium (VOLTAREN) 1 % GEL    Sig: Apply 4 g topically 3 (three) times daily as needed (for back pain).    Dispense:  100 g    Refill:  2  . pantoprazole (PROTONIX) 40 MG tablet    Sig: Take 1 tablet (40 mg total) by mouth 2 (two) times daily before a meal.    Dispense:  180 tablet    Refill:  1  . pravastatin (PRAVACHOL) 40 MG tablet    Sig: Take 1 tablet (40 mg total) by mouth every evening.    Dispense:  90 tablet    Refill:  3  . tamsulosin (FLOMAX) 0.4 MG CAPS capsule    Sig: Take 1 capsule (0.4 mg total) by mouth daily.    Dispense:  30 capsule    Refill:  3    Social History   Socioeconomic History  . Marital status:  Widowed    Spouse name: Not on file  . Number of children: Not on file  . Years of education: Not on file  . Highest education level: Not on file  Occupational History  . Occupation: Retired  Scientific laboratory technician  . Financial resource strain: Not on file  . Food insecurity    Worry: Not on file    Inability: Not on file  . Transportation needs    Medical: Not on file    Non-medical: Not on file  Tobacco Use  . Smoking status: Current Every Day Smoker    Packs/day: 0.25    Years: 57.00    Pack years: 14.25    Types: Cigarettes  . Smokeless tobacco: Never Used  . Tobacco comment: He's been smoking between 0.5 and 2 ppd since age 53.  Substance and Sexual Activity  . Alcohol use: No    Alcohol/week: 0.0 standard drinks    Comment: "quit drinking in 1986"  . Drug use: No  . Sexual activity: Never  Lifestyle  .  Physical activity    Days per week: Not on file    Minutes per session: Not on file  . Stress: Not on file  Relationships  . Social Herbalist on phone: Not on file    Gets together: Not on file    Attends religious service: Not on file    Active member of club or organization: Not on file    Attends meetings of clubs or organizations: Not on file    Relationship status: Not on file  . Intimate partner violence    Fear of current or ex partner: Not on file    Emotionally abused: Not on file    Physically abused: Not on file    Forced sexual activity: Not on file  Other Topics Concern  . Not on file  Social History Narrative   He is a widower, who moved to [place] in 2011 to live closer to his daughter. He formerly lived in [place]. He is a father of 22, grandfather, and great-grandfather of 75. Does not drink alcohol, context of smoker.    Family History  Problem Relation Age of Onset  . Breast cancer Mother   . Diabetes Mother   . Kidney disease Mother   . Heart attack Father   . Heart disease Father   . Heart attack Brother   . Leukemia Daughter    Review of Systems Pertinent negatives listed in HPI Patient Active Problem List   Diagnosis Date Noted  . Major depressive disorder, recurrent severe without psychotic features ([place]) 04/15/2018  . Depression   . Rapid atrial fibrillation ([place]) 01/18/2018  . Atrial fibrillation with RVR ([place]) 11/08/2017  . Chest pain with moderate risk of acute coronary syndrome 10/04/2017  . HTN (hypertension) 05/21/2017  . Diabetes mellitus type II 05/21/2017  . Diastolic CHF, chronic ([place]) 05/21/2017  . COPD (chronic obstructive pulmonary disease) ([place]) 05/21/2017  . H/O atrial flutter 05/21/2017  . Coronary disease/nonobstructive 05/21/2017  . Acute blood loss anemia 10/02/2016  . Abdominal pain 10/02/2016  . Constipation 10/02/2016  . Left lower quadrant pain   . Coagulopathy ([place])   . Heme positive stool   .  Symptomatic anemia 07/10/2016  . Syncope 05/15/2015  . Cervical disc disease 05/15/2015  . Abnormal urinalysis 05/15/2015  . Anemia 05/15/2015  . History of CVA (cerebrovascular accident) 04/03/2014  . Near syncope 02/15/2014  . Pre-syncope 02/15/2014  . Benign neoplasm of  rectum and anal canal 12/02/2013  . Upper GI bleeding 11/30/2013  . Tobacco use disorder 11/30/2013  . Anticoagulation goal of INR 2 to 3, for PAF - CHA2DS2Vasc = 7 08/15/2013  . Type 2 diabetes mellitus ([place]) 01/22/2013  . Tobacco abuse counseling 12/15/2012  . Hyperlipidemia   . Obesity (BMI 30-39.9) 12/14/2012  . Persistent atrial fibrillation ([place]) 10/07/2010  . Atrial flutter - status post CTI ablation 07/29/2010  . Essential hypertension 07/29/2010  . Chronic diastolic congestive heart failure ([place]) 07/29/2010  . Coronary artery disease, non-occlusive 07/29/2010    Allergies  Allergen Reactions  . Cyclobenzaprine Other (See Comments)    Unsteady gait    Prior to Admission medications   Medication Sig Start Date End Date Taking? Authorizing Provider  aspirin EC 81 MG tablet Take 81 mg by mouth daily.   Yes [provider]  citalopram (CELEXA) 20 MG tablet Take 1 tablet (20 mg total) by mouth daily. 06/17/18  Yes Scot Jun, FNP  co-enzyme Q-10 30 MG capsule Take 30 mg by mouth daily.    Yes [provider]  diclofenac sodium (VOLTAREN) 1 % GEL Apply 4 g topically 3 (three) times daily as needed (for back pain). Patient taking differently: Apply 4 g topically 2 (two) times daily as needed (for back pain).  11/10/17  Yes Rory Percy, DO  diltiazem (CARDIZEM CD) 360 MG 24 hr capsule Take 1 capsule (360 mg total) by mouth daily. 05/22/18  Yes Camnitz, Will Hassell Done, MD  ferrous sulfate 325 (65 FE) MG tablet Take 325 mg by mouth daily with breakfast.   Yes [provider]  hydrocortisone cream 1 % Apply 1 application topically 2 (two) times daily. 08/22/18  Yes Scot Jun, FNP  nitroGLYCERIN (NITROSTAT) 0.4 MG SL tablet Place 1 tablet (0.4 mg total) under the tongue every 5 (five) minutes x 3 doses as needed for chest pain. 11/19/15  Yes Kilroy, Luke K, PA-C  pantoprazole (PROTONIX) 40 MG tablet Take 1 tablet (40 mg total) by mouth 2 (two) times daily before a meal. 08/22/18  Yes Scot Jun, FNP  pravastatin (PRAVACHOL) 40 MG tablet Take 1 tablet (40 mg total) by mouth every evening. 08/22/18  Yes Scot Jun, FNP  tamsulosin (FLOMAX) 0.4 MG CAPS capsule Take 1 capsule (0.4 mg total) by mouth daily. 07/10/18  Yes Lacretia Leigh, MD    Past Medical, Surgical Family and Social History reviewed and updated.    Objective:   Today's Vitals   10/02/18 0839  Resp: 18  Temp: (!) 97.5 F (36.4 C)  TempSrc: Temporal  Weight: 161 lb 12.8 oz (73.4 kg)  Height: 5\' 6"  (1.676 m)    Wt Readings from Last 3 Encounters:  10/02/18 161 lb 12.8 oz (73.4 kg)  07/17/18 162 lb 11.2 oz (73.8 kg)  05/20/18 162 lb 9.6 oz (73.8 kg)    Physical Exam General appearance: alert, well developed, well nourished, cooperative and in no distress Head: Normocephalic, without obvious abnormality, atraumatic Respiratory: Respirations even and unlabored, normal respiratory rate Heart: Irregular rhythm consistent with Afib. Rate normal. No gallop or murmurs noted on exam  Abdomen: BS +, no distention, no rebound tenderness, or no mass Extremities: No gross deformities Skin: Skin color, texture, turgor normal. No rashes seen  Psych: Appropriate mood and affect. Neurologic: Mental status: Alert, oriented to person, place, and time, thought content appropriate.   Assessment & Plan:  1. Type 2 diabetes mellitus with hyperosmolarity without coma, without  long-term current use of insulin (HCC) - POCT URINALYSIS DIP (CLINITEK), negative of glucose  - Microalbumin/Creatinine Ratio, Urine - Hemoglobin A1c  2. Screening, lipid - Lipid Panel  3. Essential hypertension,  elevated today Traditionally well controlled. Will continue monitor. Encouraged physical activity as tolerated around the house. - Basic metabolic panel  4. Gastroesophageal reflux disease without esophagitis Medication refill only  pantoprazole (PROTONIX) 40 MG tablet; Take 1 tablet (40 mg total) by mouth 2 (two) times daily before a meal.  Dispense: 180 tablet; Refill: 1  5. Abnormal urine findings - Urine Culture pending, will wait for UC before tx Patient of recent has had chronic leukocytes and nitrates   6. Low hemoglobin, 8.4 , two months prior  Repeat  Iron, TIBC and Ferritin Panel Continue oral  iron replacement    RTC: 3 months for chronic condition follow-up  -The patient was given clear instructions to go to ER or return to medical center if symptoms do not improve, worsen or new problems develop. The patient verbalized understanding.    Molli Barrows, FNP Primary Care at [facility] [address] [place] [phone]: [phone]

## 2018-10-03 ENCOUNTER — Telehealth: Payer: Self-pay

## 2018-10-03 LAB — LIPID PANEL
Chol/HDL Ratio: 2.9 ratio (ref 0.0–5.0)
Cholesterol, Total: 135 mg/dL (ref 100–199)
HDL: 47 mg/dL (ref >39)
LDL Calculated: 69 mg/dL (ref 0–99)
Triglycerides: 94 mg/dL (ref 0–149)
VLDL Cholesterol Cal: 19 mg/dL (ref 5–40)

## 2018-10-03 LAB — MICROALBUMIN / CREATININE URINE RATIO
Creatinine, Urine: 163 mg/dL
Microalb/Creat Ratio: 127 mg/g creat — ABNORMAL HIGH (ref 0–29)
Microalbumin, Urine: 206.4 ug/mL

## 2018-10-03 LAB — BASIC METABOLIC PANEL
BUN/Creatinine Ratio: 11 (ref 10–24)
BUN: 10 mg/dL (ref 8–27)
CO2: 21 mmol/L (ref 20–29)
Calcium: 9.8 mg/dL (ref 8.6–10.2)
Chloride: 102 mmol/L (ref 96–106)
Creatinine, Ser: 0.94 mg/dL (ref 0.76–1.27)
GFR calc Af Amer: 89 mL/min/{1.73_m2} (ref >59)
GFR calc non Af Amer: 77 mL/min/{1.73_m2} (ref >59)
Glucose: 216 mg/dL — ABNORMAL HIGH (ref 65–99)
Potassium: 4.2 mmol/L (ref 3.5–5.2)
Sodium: 138 mmol/L (ref 134–144)

## 2018-10-03 LAB — IRON,TIBC AND FERRITIN PANEL
Ferritin: 14 ng/mL — ABNORMAL LOW (ref 30–400)
Iron Saturation: 24 % (ref 15–55)
Iron: 90 ug/dL (ref 38–169)
Total Iron Binding Capacity: 368 ug/dL (ref 250–450)
UIBC: 278 ug/dL (ref 111–343)

## 2018-10-03 LAB — HEMOGLOBIN A1C
Est. average glucose Bld gHb Est-mCnc: 160 mg/dL
Hgb A1c MFr Bld: 7.2 % — ABNORMAL HIGH (ref 4.8–5.6)

## 2018-10-03 NOTE — Telephone Encounter (Signed)
Called [facility]([phone], option 2, option 2) & spoke with Morey Hummingbird to add-on CBC w/diff to patient's existing specimen from yesterday.

## 2018-10-04 LAB — URINE CULTURE

## 2018-10-07 ENCOUNTER — Other Ambulatory Visit: Payer: Self-pay

## 2018-10-07 ENCOUNTER — Encounter (HOSPITAL_COMMUNITY): Payer: Self-pay

## 2018-10-07 ENCOUNTER — Emergency Department (HOSPITAL_COMMUNITY)
Admission: EM | Admit: 2018-10-07 | Discharge: 2018-10-07 | Disposition: A | Discharge status: Home / Self Care | Payer: Medicare Other | Attending: Emergency Medicine | Admitting: Emergency Medicine

## 2018-10-07 DIAGNOSIS — I110 Hypertensive heart disease with heart failure: Secondary | ICD-10-CM | POA: Diagnosis not present

## 2018-10-07 DIAGNOSIS — J449 Chronic obstructive pulmonary disease, unspecified: Secondary | ICD-10-CM | POA: Diagnosis not present

## 2018-10-07 DIAGNOSIS — I5032 Chronic diastolic (congestive) heart failure: Secondary | ICD-10-CM | POA: Insufficient documentation

## 2018-10-07 DIAGNOSIS — F17210 Nicotine dependence, cigarettes, uncomplicated: Secondary | ICD-10-CM | POA: Insufficient documentation

## 2018-10-07 DIAGNOSIS — E119 Type 2 diabetes mellitus without complications: Secondary | ICD-10-CM | POA: Diagnosis not present

## 2018-10-07 DIAGNOSIS — Z7982 Long term (current) use of aspirin: Secondary | ICD-10-CM | POA: Insufficient documentation

## 2018-10-07 DIAGNOSIS — Z87442 Personal history of urinary calculi: Secondary | ICD-10-CM | POA: Insufficient documentation

## 2018-10-07 DIAGNOSIS — E1165 Type 2 diabetes mellitus with hyperglycemia: Secondary | ICD-10-CM | POA: Diagnosis not present

## 2018-10-07 DIAGNOSIS — R42 Dizziness and giddiness: Secondary | ICD-10-CM | POA: Diagnosis not present

## 2018-10-07 DIAGNOSIS — Z960 Presence of urogenital implants: Secondary | ICD-10-CM | POA: Diagnosis not present

## 2018-10-07 DIAGNOSIS — Z79899 Other long term (current) drug therapy: Secondary | ICD-10-CM | POA: Diagnosis not present

## 2018-10-07 DIAGNOSIS — R319 Hematuria, unspecified: Secondary | ICD-10-CM | POA: Diagnosis not present

## 2018-10-07 LAB — URINALYSIS, ROUTINE W REFLEX MICROSCOPIC
Bilirubin Urine: NEGATIVE
Glucose, UA: NEGATIVE mg/dL
Ketones, ur: NEGATIVE mg/dL
Nitrite: POSITIVE — AB
Protein, ur: NEGATIVE mg/dL
Specific Gravity, Urine: 1.004 — ABNORMAL LOW (ref 1.005–1.030)
WBC, UA: >50 WBC/hpf — ABNORMAL HIGH (ref 0–5)
pH: 6 (ref 5.0–8.0)

## 2018-10-07 LAB — BASIC METABOLIC PANEL
Anion gap: 11 (ref 5–15)
BUN: 17 mg/dL (ref 8–23)
CO2: 21 mmol/L — ABNORMAL LOW (ref 22–32)
Calcium: 8.9 mg/dL (ref 8.9–10.3)
Chloride: 105 mmol/L (ref 98–111)
Creatinine, Ser: 0.99 mg/dL (ref 0.61–1.24)
GFR calc Af Amer: >60 mL/min (ref >60)
GFR calc non Af Amer: >60 mL/min (ref >60)
Glucose, Bld: 204 mg/dL — ABNORMAL HIGH (ref 70–99)
Potassium: 4 mmol/L (ref 3.5–5.1)
Sodium: 137 mmol/L (ref 135–145)

## 2018-10-07 LAB — CBC WITH DIFFERENTIAL/PLATELET
Abs Immature Granulocytes: 0.01 10*3/uL (ref 0.00–0.07)
Basophils Absolute: 0.1 10*3/uL (ref 0.0–0.2)
Basophils Absolute: 0.1 10*3/uL (ref 0.0–0.1)
Basophils Relative: 1 %
Basos: 1 %
EOS (ABSOLUTE): 0.1 10*3/uL (ref 0.0–0.4)
Eos: 2 %
Eosinophils Absolute: 0.1 10*3/uL (ref 0.0–0.5)
Eosinophils Relative: 2 %
HCT: 34.8 % — ABNORMAL LOW (ref 39.0–52.0)
Hematocrit: 36.2 % — ABNORMAL LOW (ref 37.5–51.0)
Hemoglobin: 10.9 g/dL — ABNORMAL LOW (ref 13.0–17.7)
Hemoglobin: 10.6 g/dL — ABNORMAL LOW (ref 13.0–17.0)
Immature Grans (Abs): 0 10*3/uL (ref 0.0–0.1)
Immature Granulocytes: 0 %
Immature Granulocytes: 0 %
Lymphocytes Absolute: 0.6 10*3/uL — ABNORMAL LOW (ref 0.7–3.1)
Lymphocytes Relative: 12 %
Lymphs Abs: 0.7 10*3/uL (ref 0.7–4.0)
Lymphs: 14 %
MCH: 26.1 pg — ABNORMAL LOW (ref 26.6–33.0)
MCH: 26.4 pg (ref 26.0–34.0)
MCHC: 30.1 g/dL — ABNORMAL LOW (ref 31.5–35.7)
MCHC: 30.5 g/dL (ref 30.0–36.0)
MCV: 87 fL (ref 79–97)
MCV: 86.8 fL (ref 80.0–100.0)
Monocytes Absolute: 0.4 10*3/uL (ref 0.1–0.9)
Monocytes Absolute: 0.4 10*3/uL (ref 0.1–1.0)
Monocytes Relative: 8 %
Monocytes: 9 %
Neutro Abs: 4.5 10*3/uL (ref 1.7–7.7)
Neutrophils Absolute: 3.2 10*3/uL (ref 1.4–7.0)
Neutrophils Relative %: 77 %
Neutrophils: 74 %
Platelets: 286 10*3/uL (ref 150–450)
Platelets: 260 10*3/uL (ref 150–400)
RBC: 4.17 x10E6/uL (ref 4.14–5.80)
RBC: 4.01 MIL/uL — ABNORMAL LOW (ref 4.22–5.81)
RDW: 15.3 % (ref 11.6–15.4)
RDW: 15.6 % — ABNORMAL HIGH (ref 11.5–15.5)
WBC: 4.3 10*3/uL (ref 3.4–10.8)
WBC: 5.8 10*3/uL (ref 4.0–10.5)
nRBC: 0 % (ref 0.0–0.2)

## 2018-10-07 LAB — SPECIMEN STATUS REPORT

## 2018-10-07 IMAGING — CT CT ABD-PELV W/ CM
2 of 5 series · 17 of 46 positions shown, 19 images · IV contrast (omnipaque)
Comparison: 03/09/2017

CLINICAL DATA: Intermittent chest pain

EXAM:
CT ABDOMEN AND PELVIS WITH CONTRAST
TECHNIQUE: Multidetector CT imaging of the abdomen and pelvis was performed
using the standard protocol following bolus administration of
intravenous contrast.
CONTRAST:  100mL OMNIPAQUE IOHEXOL 300 MG/ML  SOLN

[Series 3: abd/ pelvis 5.0 i30f 2 · axial · 0.86mm/px · z∈[+766,+1186]mm · 14 of 94 slices shown, 16 images]
[im 5/94  soft-tissue]
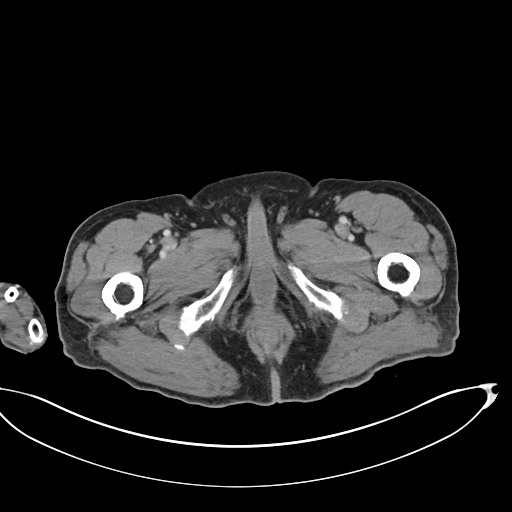
[im 5/94  bone]
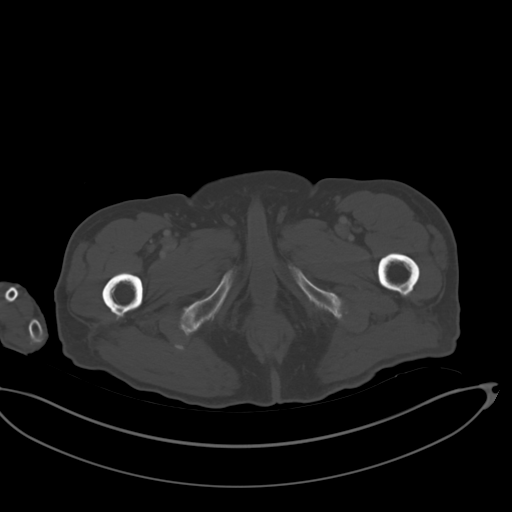
[im 10/94  soft-tissue]
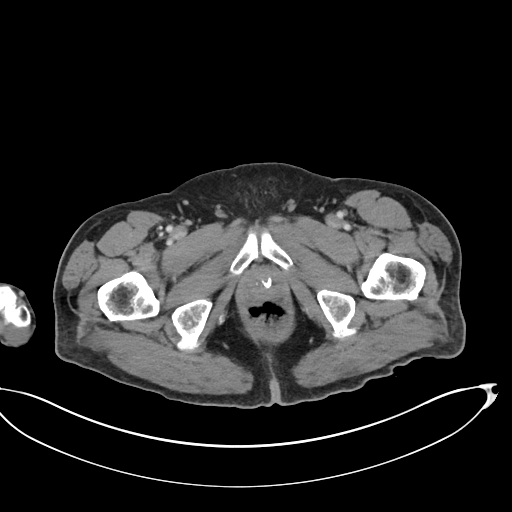
[im 20/94  soft-tissue]
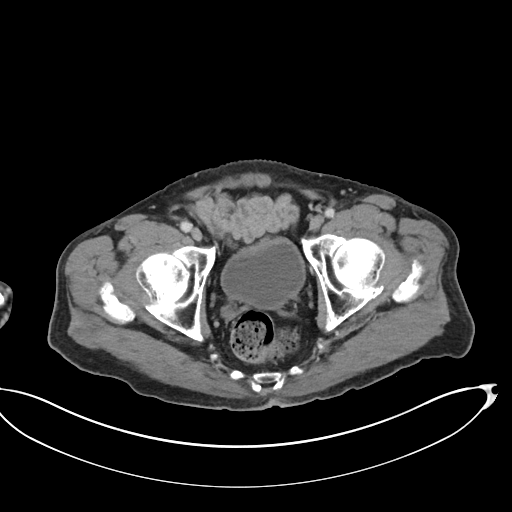
[im 25/94  soft-tissue]
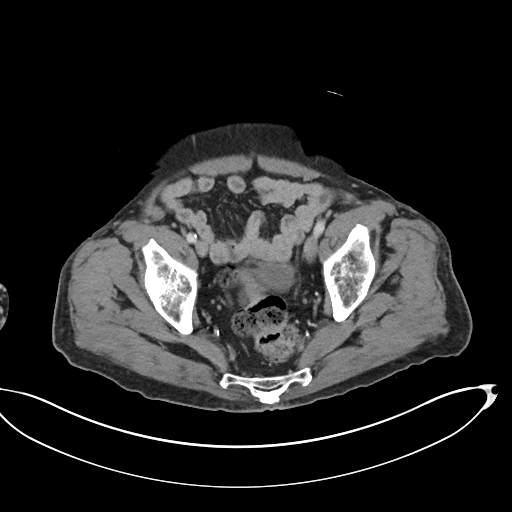
[im 30/94  soft-tissue]
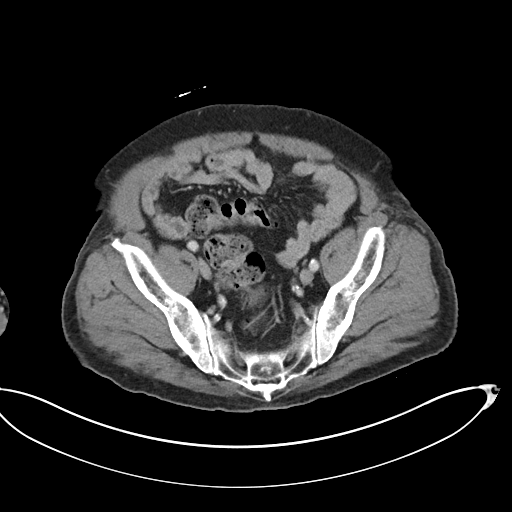
[im 40/94  soft-tissue]
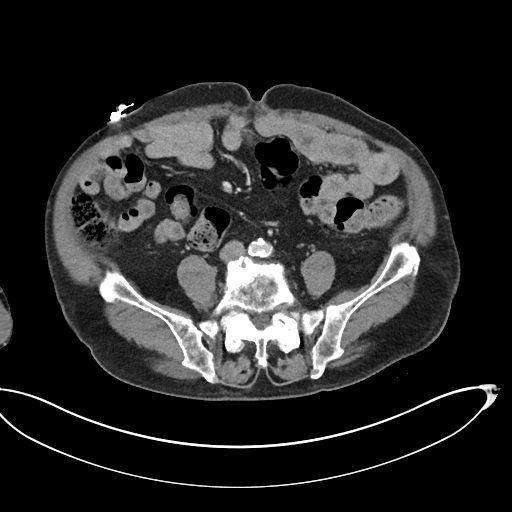
[im 45/94  soft-tissue]
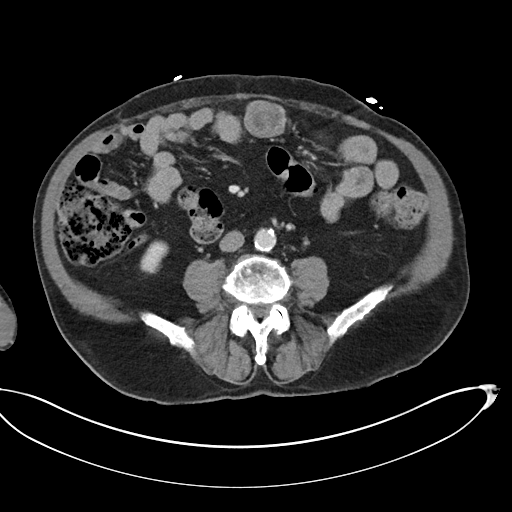
[im 49/94  soft-tissue]
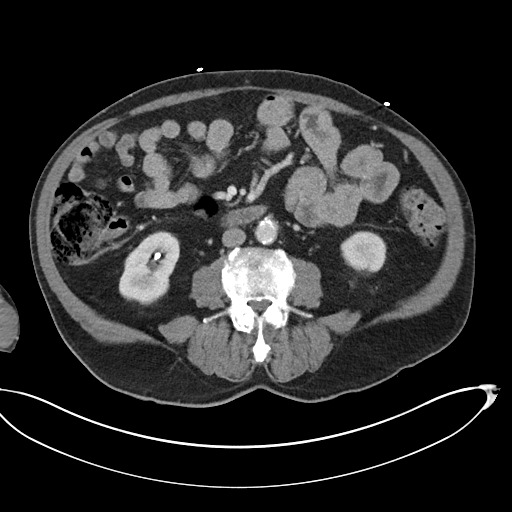
[im 54/94  soft-tissue]
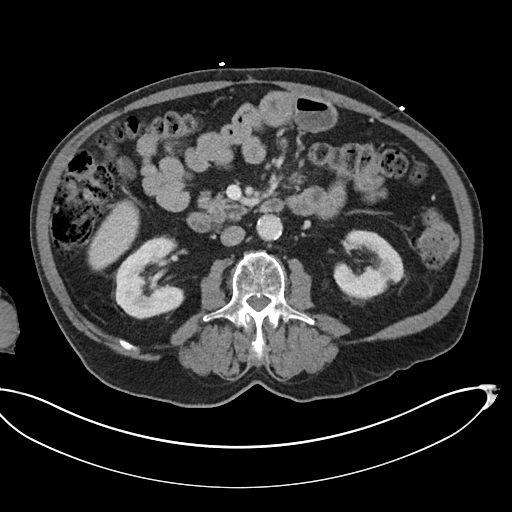
[im 54/94  bone]
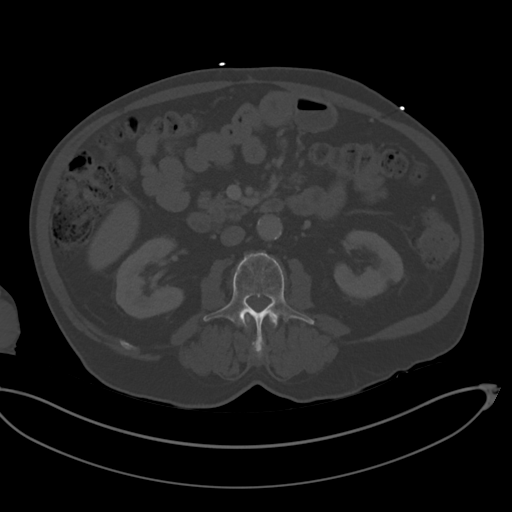
[im 64/94  soft-tissue]
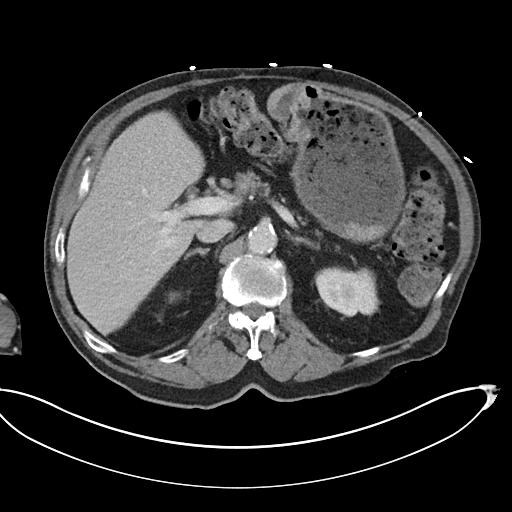
[im 69/94  soft-tissue]
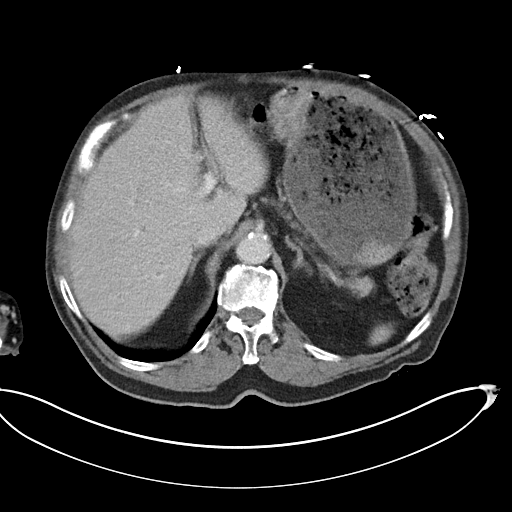
[im 74/94  soft-tissue]
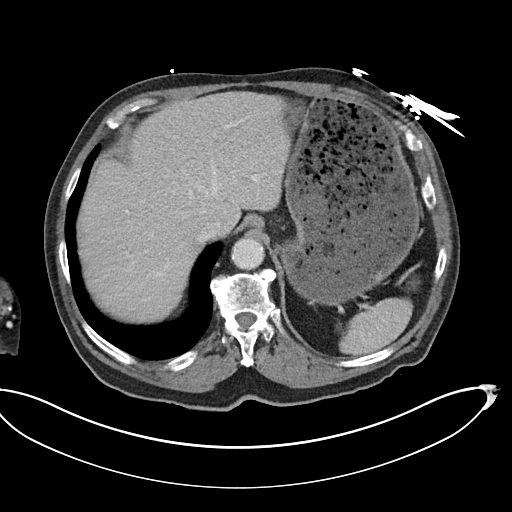
[im 84/94  soft-tissue]
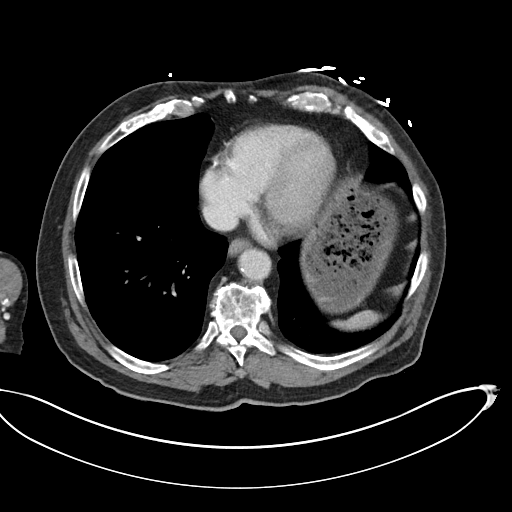
[im 89/94  soft-tissue]
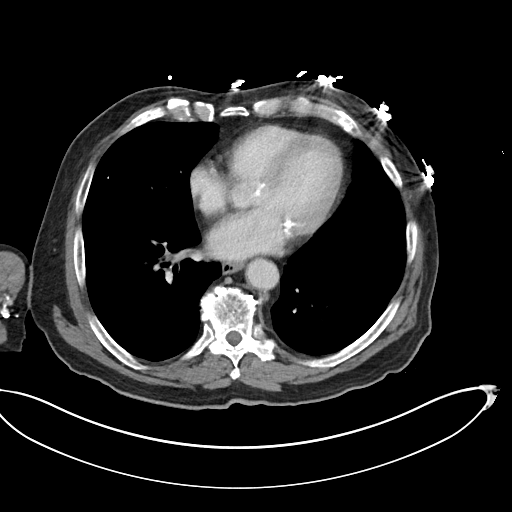

[Series 6: coronal soft tissue · coronal · 0.90mm/px · 3 of 110 slices shown]
[im 37/110  soft-tissue]
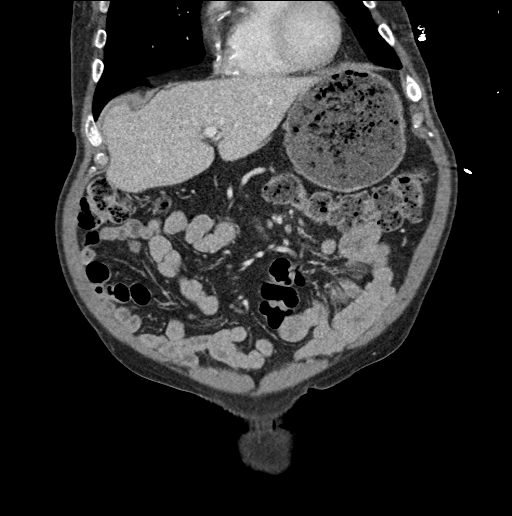
[im 49/110  soft-tissue]
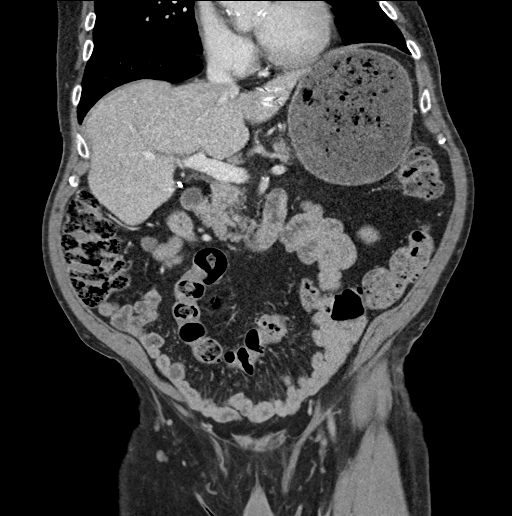
[im 61/110  soft-tissue]
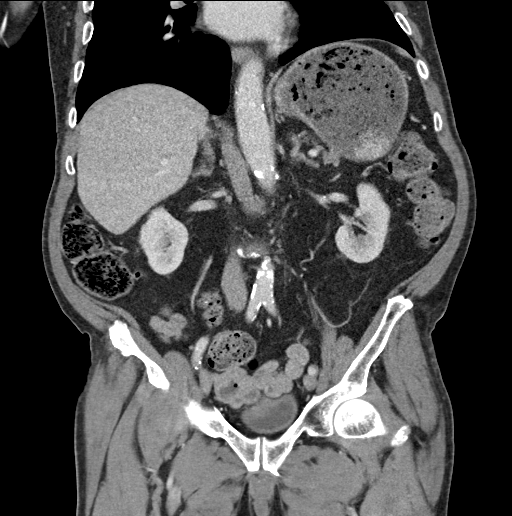

[17 of 46 positions shown; findings below may reference images not displayed]

FINDINGS: Lower chest: Lung bases demonstrate calcified granuloma in the right
middle lobe. No acute consolidation or pleural effusion. Heart size
within normal limits. Coronary vascular calcification. Clips near
the GE junction.

Hepatobiliary: No focal liver abnormality is seen. Status post
cholecystectomy. No biliary dilatation.

Pancreas: Unremarkable. No pancreatic ductal dilatation or
surrounding inflammatory changes.

Spleen: Normal in size without focal abnormality.

Adrenals/Urinary Tract: Adrenal glands are within normal limits. No
hydronephrosis. 7 mm stone lower pole right kidney. Bladder within
normal limits.

Stomach/Bowel: Status post partial gastrectomy. Similar appearance
moderate dilatation of the stomach filled with food and debris. Mild
thickening at the gastric small bowel anastomosis anteriorly,
similar compared to prior. No evidence for small bowel obstruction.
No colon wall thickening. Negative appendix.

Vascular/Lymphatic: Marked aortic atherosclerosis. No significant
adenopathy

Reproductive: Multiple prostate calcifications.

Other: Negative for free air or free fluid.

Musculoskeletal: Degenerative changes. No acute or suspicious
abnormality.
IMPRESSION: 1. No definite CT evidence for acute intra-abdominal or pelvic
abnormality.
2. Status post partial gastrectomy and gastrojejunostomy with
similar appearance of moderately enlarged stomach containing food
and debris.
3. Nonobstructing stone in the lower pole of the right kidney.

## 2018-10-07 IMAGING — CR DG CHEST 2V
2 series · 2 of 2 positions shown · non-contrast
Comparison: 05/21/2017, 03/09/2017

CLINICAL DATA: Chest pain

EXAM:
CHEST - 2 VIEW

[chest lat]
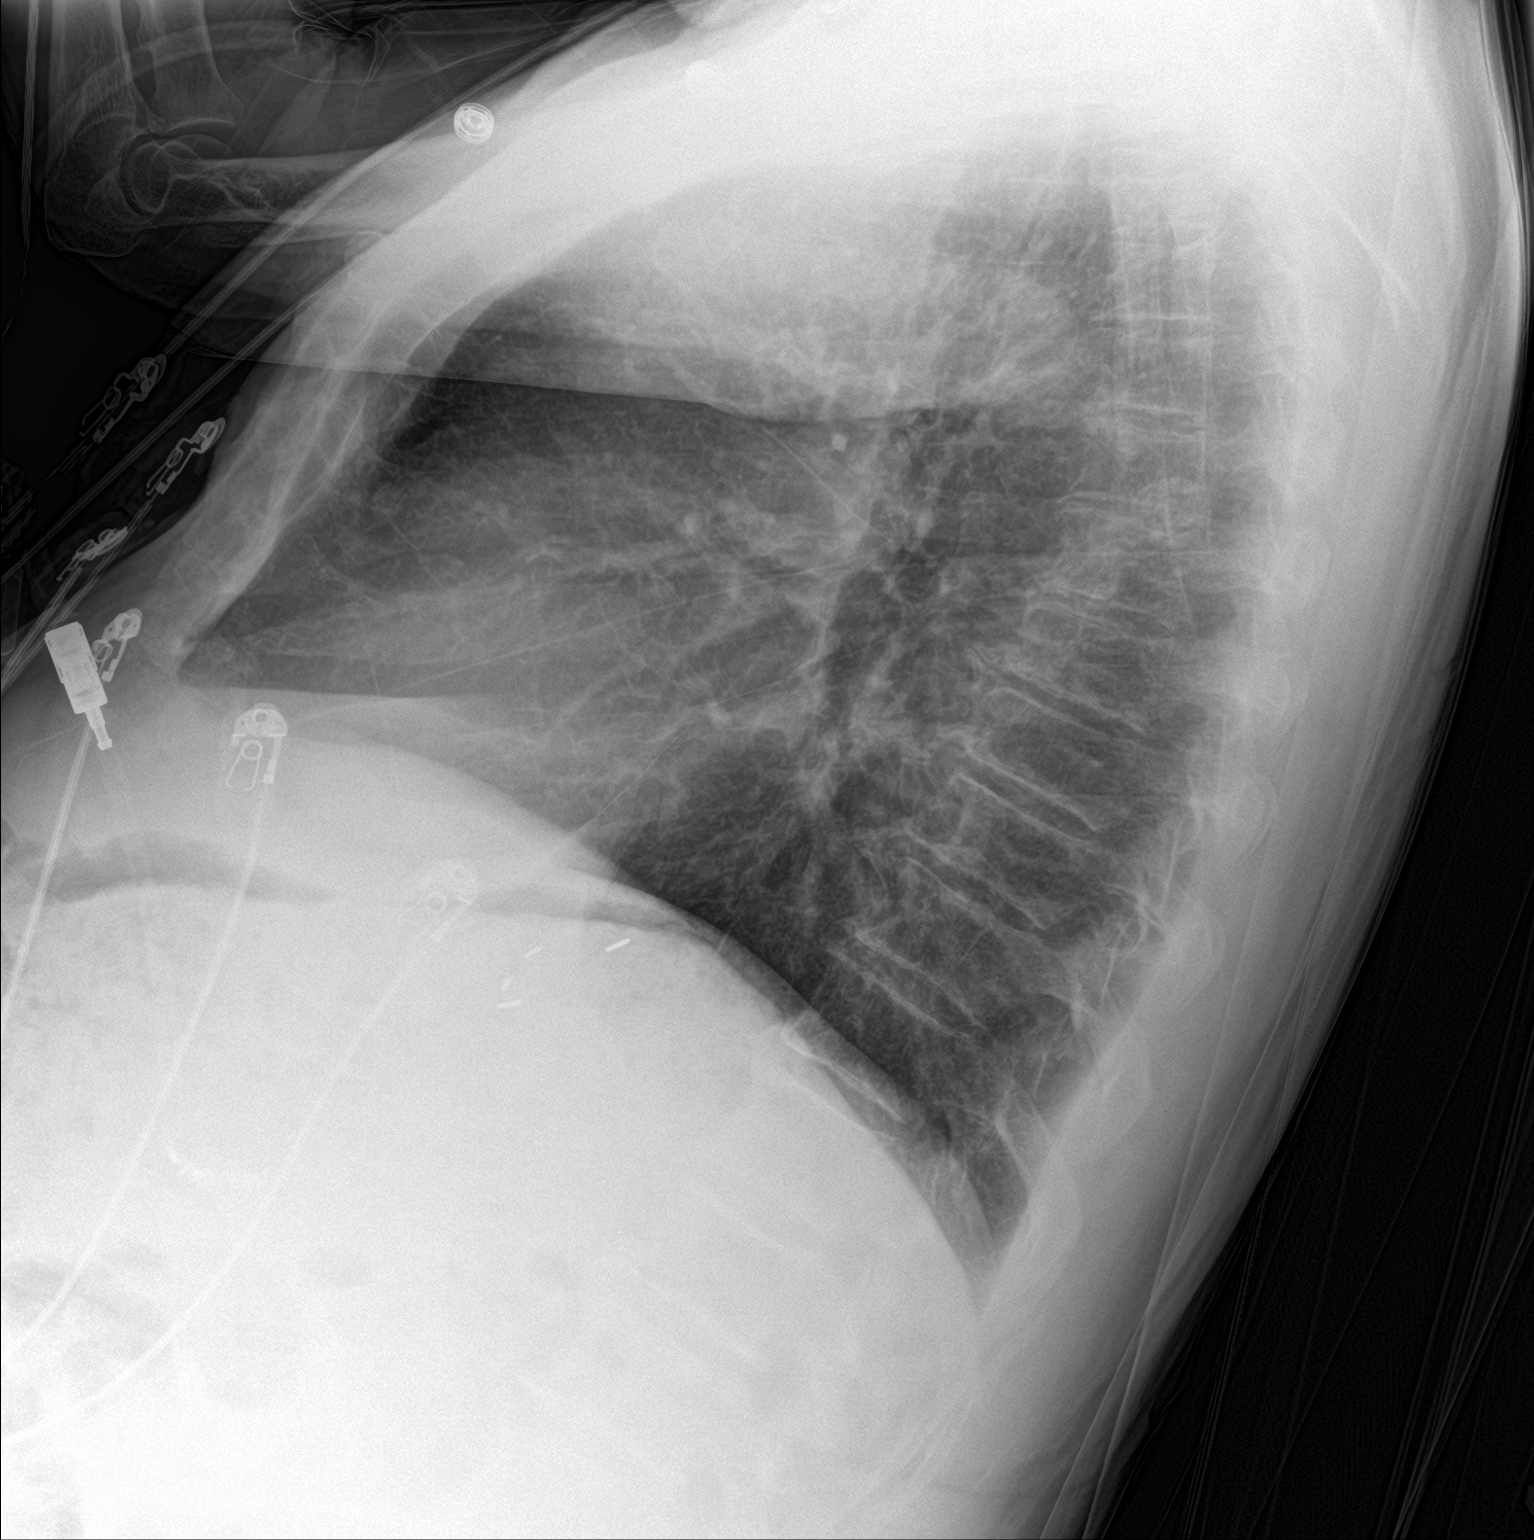

[chest ap]
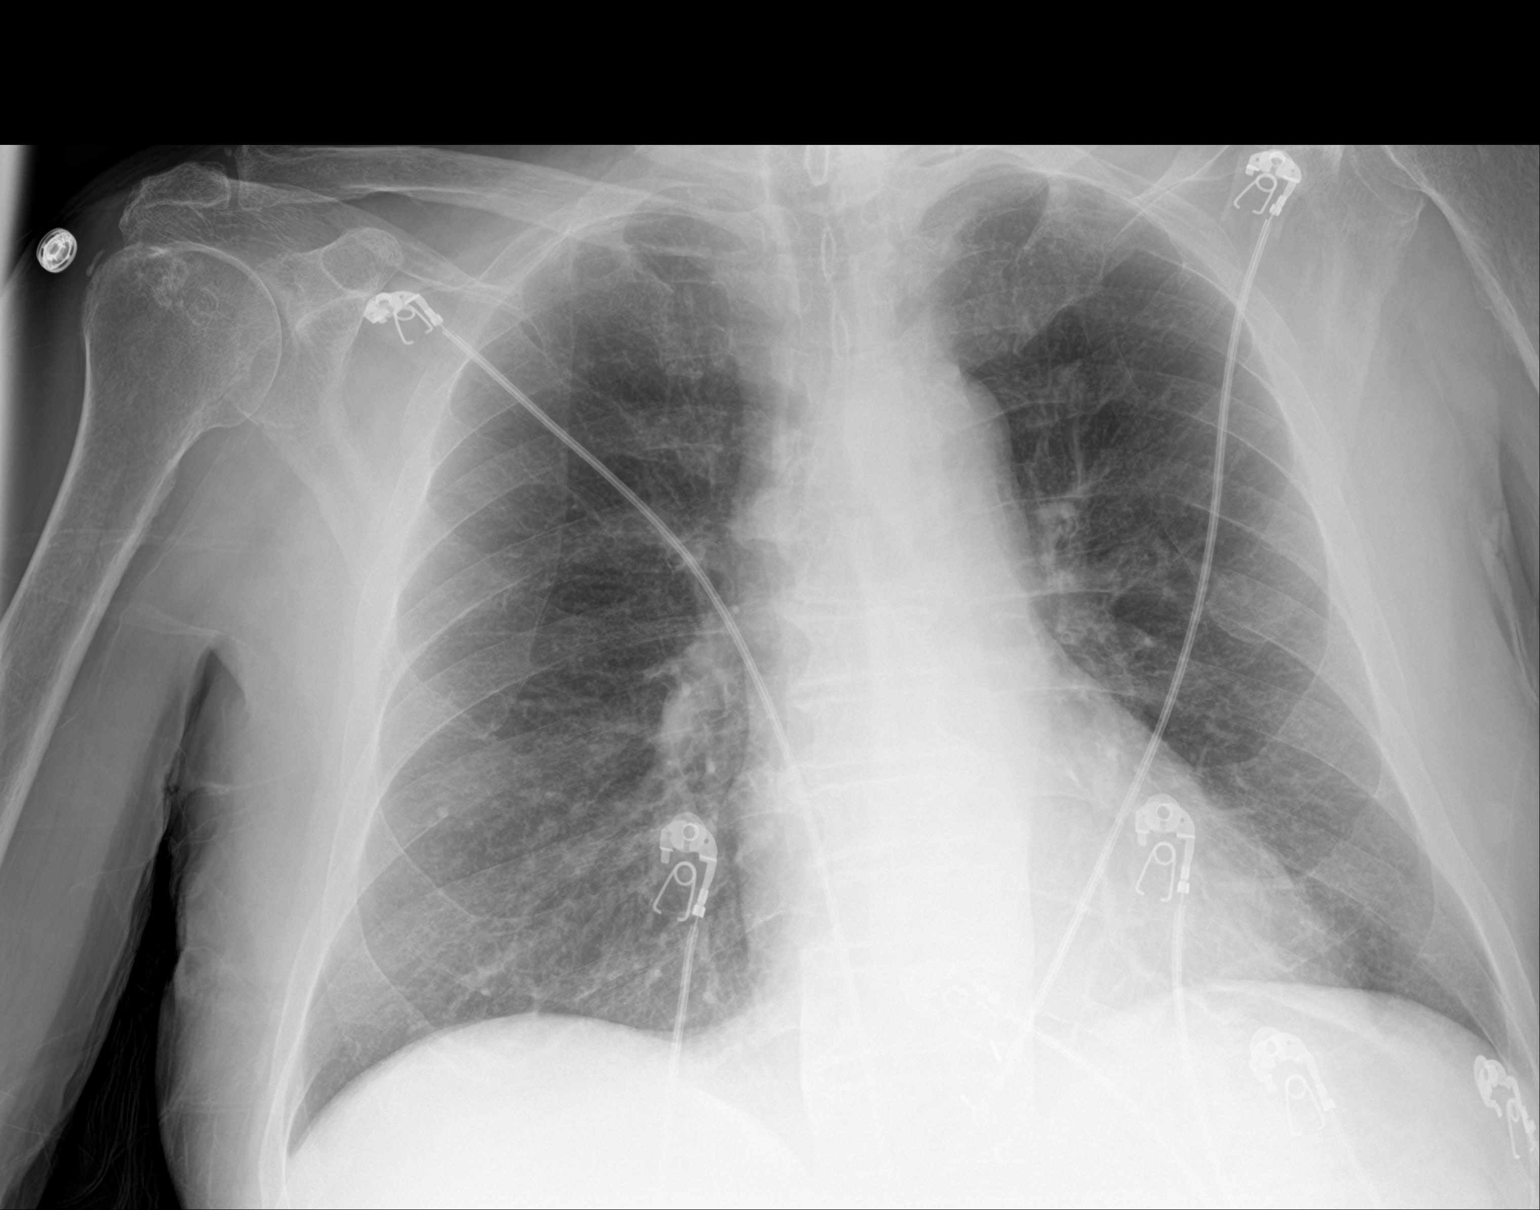

[2 of 2 positions shown; findings below may reference images not displayed]

FINDINGS: No acute pulmonary infiltrate or effusion. Stable cardiomediastinal
silhouette. No pneumothorax.
IMPRESSION: No active cardiopulmonary disease.

## 2018-10-07 NOTE — ED Triage Notes (Signed)
Per EMS: Pt c/o of intermittent blood/clots in foley bag.  Pt c/o of anxiety when he sees blood.  Pt's last catheter change was 2 weeks ago.

## 2018-10-07 NOTE — ED Notes (Signed)
Patient ambulated to bathroom with no assistance.

## 2018-10-07 NOTE — ED Provider Notes (Signed)
[facility] DEPT Provider Note   CSN: [id number] Arrival date & time: 10/07/18  0830    History   Chief Complaint Chief Complaint  Patient presents with  . Hematuria  . chronic foley    HPI [name] is a 78 y.o. male.     HPI   Patient is here for evaluation of intermittent hematuria, associated with a sensation of dizziness each time he sees the bleeding.  He states the bleeding comes and goes and is characterized by various sizes of small clots.  He recently had lithotripsy, and passed multiple kidney stones but feels like he has not passed any within the last 10 days.  He denies fever, chills, nausea, vomiting, focal weakness or paresthesia.  He states that he does not have high blood pressure.  He has been eating well.  He lives with family members and came here by EMS today.  There are no other known modifying factors.  Past Medical History:  Diagnosis Date  . Allergic rhinitis   . Anemia    takes Ferrous Sulfate daily  . Arthritis    "all over"  . Atrial flutter ([place])   . Balance problem 01/2014  . CAP (community acquired pneumonia) 09/18/2014  . Cervical radiculopathy due to degenerative joint disease of spine   . Chronic coronary artery disease    a. 03/2010 Nonocclusive disease by cath, performed for ST elevations on ECG;  b. 06/2013 Lexi MV: EF 60%, no ischemia.  [name] COPD (chronic obstructive pulmonary disease) ([place])   . Diabetes mellitus type II    takes Metformin and Lantus daily  . Diastolic CHF, chronic ([place])    a. 12/2012 EF 55-60%, diast dysfxn, triv MR, mildly dil LA/RA.  [name] Dyspnea    with pain  . Essential hypertension    takes Diltiazem daily  . Frail elderly   . GERD (gastroesophageal reflux disease)   . History of blood transfusion 1982   "when I had stomach OR"  . History of bronchitis    1998  . History of CVA (cerebrovascular accident)   . History of gastric ulcer   . History of kidney stones   .  Hypercholesterolemia    takes Pravastatin daily  . Intrinsic eczema   . Legally blind in left eye, as defined in [place]   . Obesity   . PAF (paroxysmal atrial fibrillation) (HCC)    Recurrent after atrial flutter (a. 07/2010 Status post caval tricuspid isthmus ablation by Dr. Midge Aver Metoprolol daily), currently controlled on flecainide plus diltiazem and Coumadin  . Urinary retention   . Vitamin D deficiency   . Weakness    numbness and tingling both hands    Patient Active Problem List   Diagnosis Date Noted  . Major depressive disorder, recurrent severe without psychotic features ([place]) 04/15/2018  . Depression   . Rapid atrial fibrillation ([place]) 01/18/2018  . Atrial fibrillation with RVR ([place]) 11/08/2017  . Chest pain with moderate risk of acute coronary syndrome 10/04/2017  . HTN (hypertension) 05/21/2017  . Diabetes mellitus type II 05/21/2017  . Diastolic CHF, chronic ([place]) 05/21/2017  . COPD (chronic obstructive pulmonary disease) ([place]) 05/21/2017  . H/O atrial flutter 05/21/2017  . Coronary disease/nonobstructive 05/21/2017  . Acute blood loss anemia 10/02/2016  . Abdominal pain 10/02/2016  . Constipation 10/02/2016  . Left lower quadrant pain   . Coagulopathy ([place])   . Heme positive stool   . Symptomatic anemia 07/10/2016  . Syncope 05/15/2015  .  Cervical disc disease 05/15/2015  . Abnormal urinalysis 05/15/2015  . Anemia 05/15/2015  . History of CVA (cerebrovascular accident) 04/03/2014  . Near syncope 02/15/2014  . Pre-syncope 02/15/2014  . Benign neoplasm of rectum and anal canal 12/02/2013  . Upper GI bleeding 11/30/2013  . Tobacco use disorder 11/30/2013  . Anticoagulation goal of INR 2 to 3, for PAF - CHA2DS2Vasc = 7 08/15/2013  . Type 2 diabetes mellitus ([place]) 01/22/2013  . Tobacco abuse counseling 12/15/2012  . Hyperlipidemia   . Obesity (BMI 30-39.9) 12/14/2012  . Persistent atrial fibrillation ([place]) 10/07/2010  . Atrial flutter - status post CTI  ablation 07/29/2010  . Essential hypertension 07/29/2010  . Chronic diastolic congestive heart failure ([place]) 07/29/2010  . Coronary artery disease, non-occlusive 07/29/2010    Past Surgical History:  Procedure Laterality Date  . ATRIAL ABLATION SURGERY  08/05/10   CTI ablation for atrial flutter by JA  . CARDIAC CATHETERIZATION  2012   nl LV function, no occlusive CAD, PAF  . CARDIOVERSION  12/07/2010    Successful direct current cardioversion with atrial fibrillation to normal sinus rhythm  . CARPAL TUNNEL RELEASE Bilateral 01/30/2014   Procedure: BILATERAL CARPAL TUNNEL RELEASE;  Surgeon: Marianna Payment, MD;  Location: [facility];  Service: Orthopedics;  Laterality: Bilateral;  . CATARACT EXTRACTION W/ INTRAOCULAR LENS  IMPLANT, BILATERAL Bilateral   . COLONOSCOPY [name] 12/02/2013   Procedure: COLONOSCOPY;  Surgeon: Irene Shipper, MD;  Location: [facility];  Service: Endoscopy;  Laterality: [name];  . ESOPHAGOGASTRODUODENOSCOPY [name] 09/22/2014   Procedure: ESOPHAGOGASTRODUODENOSCOPY (EGD);  Surgeon: Ronald Lobo, MD;  Location: [facility] ENDOSCOPY;  Service: Endoscopy;  Laterality: [name];  . ESOPHAGOGASTRODUODENOSCOPY [name] 07/11/2016   Procedure: ESOPHAGOGASTRODUODENOSCOPY (EGD);  Surgeon: Doran Stabler, MD;  Location: [facility] ENDOSCOPY;  Service: Endoscopy;  Laterality: [name];  . EXTRACORPOREAL SHOCK WAVE LITHOTRIPSY Right 07/15/2018   Procedure: EXTRACORPOREAL SHOCK WAVE LITHOTRIPSY (ESWL);  Surgeon: Lucas Mallow, MD;  Location: [facility];  Service: Urology;  Laterality: Right;  . INCISION AND DRAINAGE ABSCESS / HEMATOMA OF BURSA / KNEE / THIGH Left 1998   knee  . KNEE BURSECTOMY Left 1998  . LAPAROSCOPIC CHOLECYSTECTOMY  03/2010  . LEFT HEART CATH AND CORONARY ANGIOGRAPHY [name] 10/05/2017   Procedure: LEFT HEART CATH AND CORONARY ANGIOGRAPHY;  Surgeon: Martinique, Peter M, MD;  Location: [facility] CV LAB;  Service: Cardiovascular;  Laterality: [name];  . NM MYOVIEW LTD  07/22/2013   Normal EF ~60%, no ischemia  or infarction.  [name] PARTIAL GASTRECTOMY  1982   subtotal; "took out 30% for ulcers"  . TRANSTHORACIC ECHOCARDIOGRAM  02/16/2014   EF 60%, no RWMA. - otherwise normal  . YAG LASER APPLICATION Bilateral         Home Medications    Prior to Admission medications   Medication Sig Start Date End Date Taking? Authorizing Provider  aspirin EC 81 MG tablet Take 81 mg by mouth daily.    [provider]  citalopram (CELEXA) 20 MG tablet Take 1 tablet (20 mg total) by mouth daily. 10/02/18   Scot Jun, FNP  co-enzyme Q-10 30 MG capsule Take 30 mg by mouth daily.     [provider]  diclofenac sodium (VOLTAREN) 1 % GEL Apply 4 g topically 3 (three) times daily as needed (for back pain). 10/02/18   Scot Jun, FNP  diltiazem (CARDIZEM CD) 360 MG 24 hr capsule Take 1 capsule (360 mg total) by mouth daily. 10/02/18   Molli Barrows  S, FNP  ferrous sulfate 325 (65 FE) MG tablet Take 325 mg by mouth daily with breakfast.    [provider]  hydrocortisone cream 1 % Apply 1 application topically 2 (two) times daily. 10/02/18   Scot Jun, FNP  nitroGLYCERIN (NITROSTAT) 0.4 MG SL tablet Place 1 tablet (0.4 mg total) under the tongue every 5 (five) minutes x 3 doses as needed for chest pain. 11/19/15   Erlene Quan, PA-C  pantoprazole (PROTONIX) 40 MG tablet Take 1 tablet (40 mg total) by mouth 2 (two) times daily before a meal. 10/02/18   Scot Jun, FNP  pravastatin (PRAVACHOL) 40 MG tablet Take 1 tablet (40 mg total) by mouth every evening. 10/02/18   Scot Jun, FNP  tamsulosin (FLOMAX) 0.4 MG CAPS capsule Take 1 capsule (0.4 mg total) by mouth daily. 10/02/18   Scot Jun, FNP    Family History Family History  Problem Relation Age of Onset  . Breast cancer Mother   . Diabetes Mother   . Kidney disease Mother   . Heart attack Father   . Heart disease Father   . Heart attack Brother   . Leukemia Daughter     Social History Social  History   Tobacco Use  . Smoking status: Current Every Day Smoker    Packs/day: 0.25    Years: 57.00    Pack years: 14.25    Types: Cigarettes  . Smokeless tobacco: Never Used  . Tobacco comment: He's been smoking between 0.5 and 2 ppd since age 44.  Substance Use Topics  . Alcohol use: No    Alcohol/week: 0.0 standard drinks    Comment: "quit drinking in 1986"  . Drug use: No     Allergies   Cyclobenzaprine   Review of Systems Review of Systems  All other systems reviewed and are negative.    Physical Exam Updated Vital Signs BP 118/63   Pulse 80   Temp 98.3 F (36.8 C) (Oral)   Resp 16   Ht 5\' 6"  (1.676 m)   Wt 73.9 kg   SpO2 97%   BMI 26.31 kg/m   Physical Exam Vitals signs and nursing note reviewed.  Constitutional:      General: He is not in acute distress.    Appearance: He is well-developed and normal weight. He is not ill-appearing, toxic-appearing or diaphoretic.  HENT:     Head: Normocephalic and atraumatic.     Right Ear: External ear normal.     Left Ear: External ear normal.  Eyes:     Conjunctiva/sclera: Conjunctivae normal.     Pupils: Pupils are equal, round, and reactive to light.  Neck:     Musculoskeletal: Normal range of motion and neck supple.     Trachea: Phonation normal.  Cardiovascular:     Rate and Rhythm: Normal rate and regular rhythm.     Heart sounds: Normal heart sounds.  Pulmonary:     Effort: Pulmonary effort is normal.     Breath sounds: Normal breath sounds.  Abdominal:     Palpations: Abdomen is soft.     Tenderness: There is no abdominal tenderness.  Genitourinary:    Comments: No costovertebral angle tenderness with percussion.  Urinary bladder catheter is draining yellow urine into a leg bag on his left leg.  No gross sign of bleeding in the urinary bag. Musculoskeletal: Normal range of motion.  Skin:    General: Skin is warm and dry.  Neurological:  Mental Status: He is alert and oriented to person,  place, and time.     Cranial Nerves: No cranial nerve deficit.     Sensory: No sensory deficit.     Motor: No abnormal muscle tone.     Coordination: Coordination normal.  Psychiatric:        Mood and Affect: Mood normal.        Behavior: Behavior normal.        Thought Content: Thought content normal.        Judgment: Judgment normal.      ED Treatments / Results  Labs (all labs ordered are listed, but only abnormal results are displayed) Labs Reviewed  BASIC METABOLIC PANEL - Abnormal; Notable for the following components:      Result Value   CO2 21 (*)    Glucose, Bld 204 (*)    All other components within normal limits  CBC WITH DIFFERENTIAL/PLATELET - Abnormal; Notable for the following components:   RBC 4.01 (*)    Hemoglobin 10.6 (*)    HCT 34.8 (*)    RDW 15.6 (*)    All other components within normal limits  URINALYSIS, ROUTINE W REFLEX MICROSCOPIC - Abnormal; Notable for the following components:   APPearance HAZY (*)    Specific Gravity, Urine 1.004 (*)    Hgb urine dipstick MODERATE (*)    Nitrite POSITIVE (*)    Leukocytes,Ua LARGE (*)    WBC, UA >50 (*)    Bacteria, UA MANY (*)    All other components within normal limits    EKG None  Radiology No results found.  Procedures Procedures (including critical care time)  Medications Ordered in ED Medications - No data to display   Initial Impression / Assessment and Plan / ED Course  I have reviewed the triage vital signs and the nursing notes.  Pertinent labs & imaging results that were available during my care of the patient were reviewed by me and considered in my medical decision making (see chart for details).  Clinical Course as of Oct 06 1241  Mon Oct 07, 2018  1125 Abnormal-hemoglobin, nitrite, leukocytes, WBCs, bacteria, all present.  Urinalysis, Routine w reflex microscopic(!) [EW]  1125 Abnormal, CO2 low, glucose high  Basic metabolic panel(!) [EW]  2637 Normal except hemoglobin low   CBC with Differential(!) [EW]    Clinical Course User Index [EW] Daleen Bo, MD        Patient Vitals for the past 24 hrs:  BP Temp Temp src Pulse Resp SpO2 Height Weight  10/07/18 1136 118/63 - - 80 16 97 % - -  10/07/18 0850 - - - - - - 5\' 6"  (1.676 m) 73.9 kg  10/07/18 0847 128/78 98.3 F (36.8 C) Oral 91 20 96 % - -  10/07/18 0842 - - - - - 99 % - -    12:40 PM Reevaluation with update and discussion. After initial assessment and treatment, an updated evaluation reveals he is comfortable at this time, he has been able ambulate without difficulty.  He has no other complaints.  Findings discussed with the patient and all other questions answered. Daleen Bo   Medical Decision Making: Reported hematuria, without visible hematuria at the time of evaluation.  Abnormal appearing urine however he has a chronic Foley catheterization.  There is no fever, signs of sepsis, or clear evidence for acute urinary tract infection.  Therefore urine culture will not be ordered.  The patient recently had lithotripsy and has  passed multiple stones.  He may have residual bleeding secondary to that.  He is clinically stable and does not require further evaluation at this time.  [name] was evaluated in Emergency Department on 10/07/2018 for the symptoms described in the history of present illness. He was evaluated in the context of the global COVID-19 pandemic, which necessitated consideration that the patient might be at risk for infection with the SARS-CoV-2 virus that causes COVID-19. Institutional protocols and algorithms that pertain to the evaluation of patients at risk for COVID-19 are in a state of rapid change based on information released by regulatory bodies including the CDC and federal and state organizations. These policies and algorithms were followed during the patient's care in the ED.   CRITICAL CARE- No Performed by: Daleen Bo  Nursing Notes Reviewed/ Care Coordinated  Applicable Imaging Reviewed Interpretation of Laboratory Data incorporated into ED treatment  The patient appears reasonably screened and/or stabilized for discharge and I doubt any other medical condition or other [facility] requiring further screening, evaluation, or treatment in the ED at this time prior to discharge.  Plan: Home Medications-continue usual; Home Treatments-rest, fluids; return here if the recommended treatment, does not improve the symptoms; Recommended follow up-PCP, neurology, PRN    Final Clinical Impressions(s) / ED Diagnoses   Final diagnoses:  Hematuria, unspecified type    ED Discharge Orders    None       Daleen Bo, MD 10/07/18 1243

## 2018-10-07 NOTE — Discharge Instructions (Addendum)
The testing today did not show any serious problems.  Make sure you are drinking plenty of fluids and eating 3 meals each day.  Follow-up with your urologist, as scheduled.  Return here, if needed, for problems.

## 2018-10-08 ENCOUNTER — Other Ambulatory Visit: Payer: Self-pay | Admitting: Family Medicine

## 2018-10-08 NOTE — Telephone Encounter (Signed)
Left voice mail to call back 

## 2018-10-08 NOTE — Telephone Encounter (Signed)
Contact patient to advise of the following regarding recent urine culture.  It is positive for infection with plan to treat with ciprofloxacin 500 mg twice daily x 7 days.  Also recommend urologist be is made of aware of urine infection. Other labs:  A1C is increased 7.2. Recommend restarting metformin 500 mg once daily.  Iron level has improved no need to continue oral iron replacement tablets. Keep 3 month follow-up

## 2018-10-09 IMAGING — CR DG CHEST 2V
2 series · 2 of 2 positions shown · non-contrast
Comparison: Chest radiograph dated 07/27/2017

CLINICAL DATA: 77-year-old male with cough.

EXAM:
CHEST - 2 VIEW

[w chest pa]
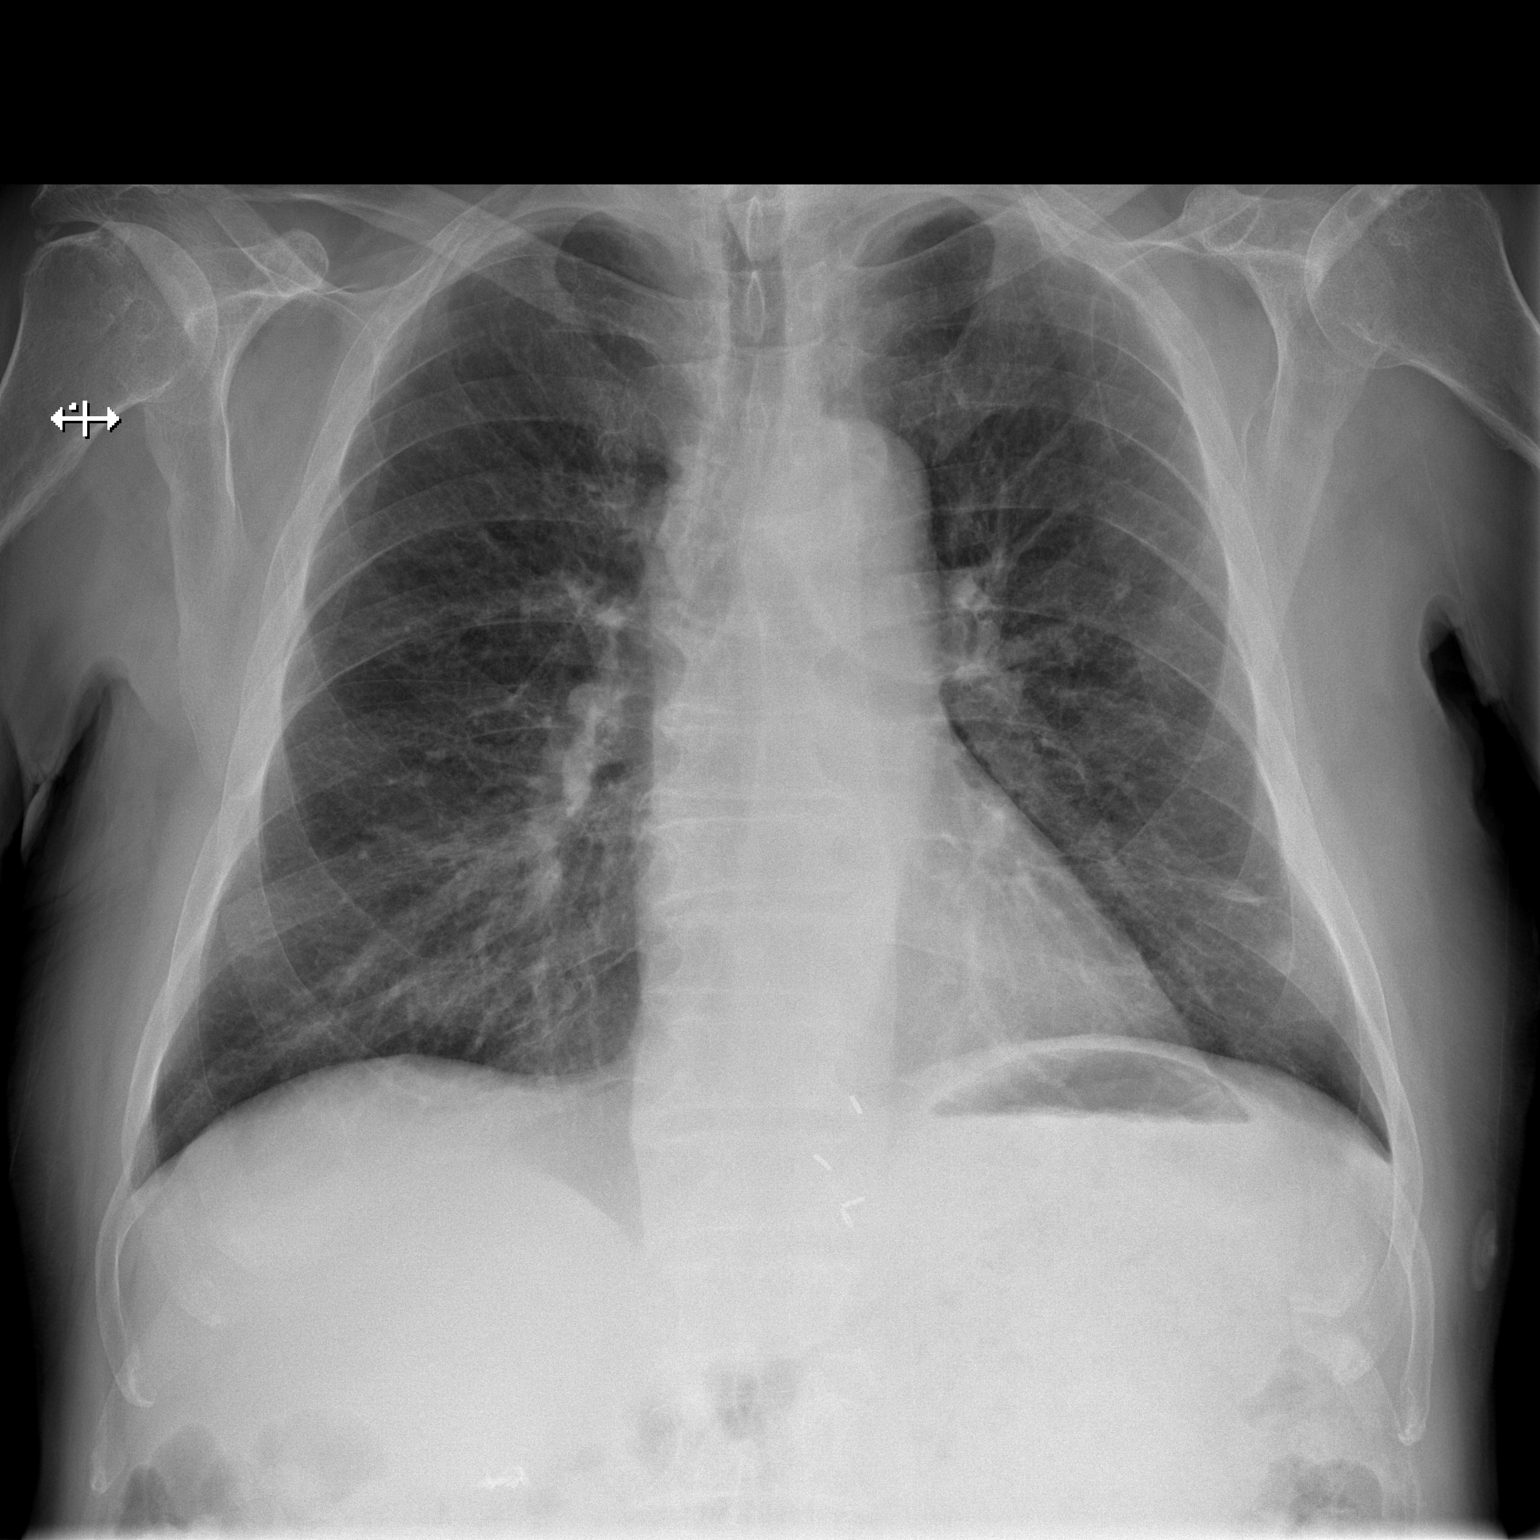

[w chest lat]
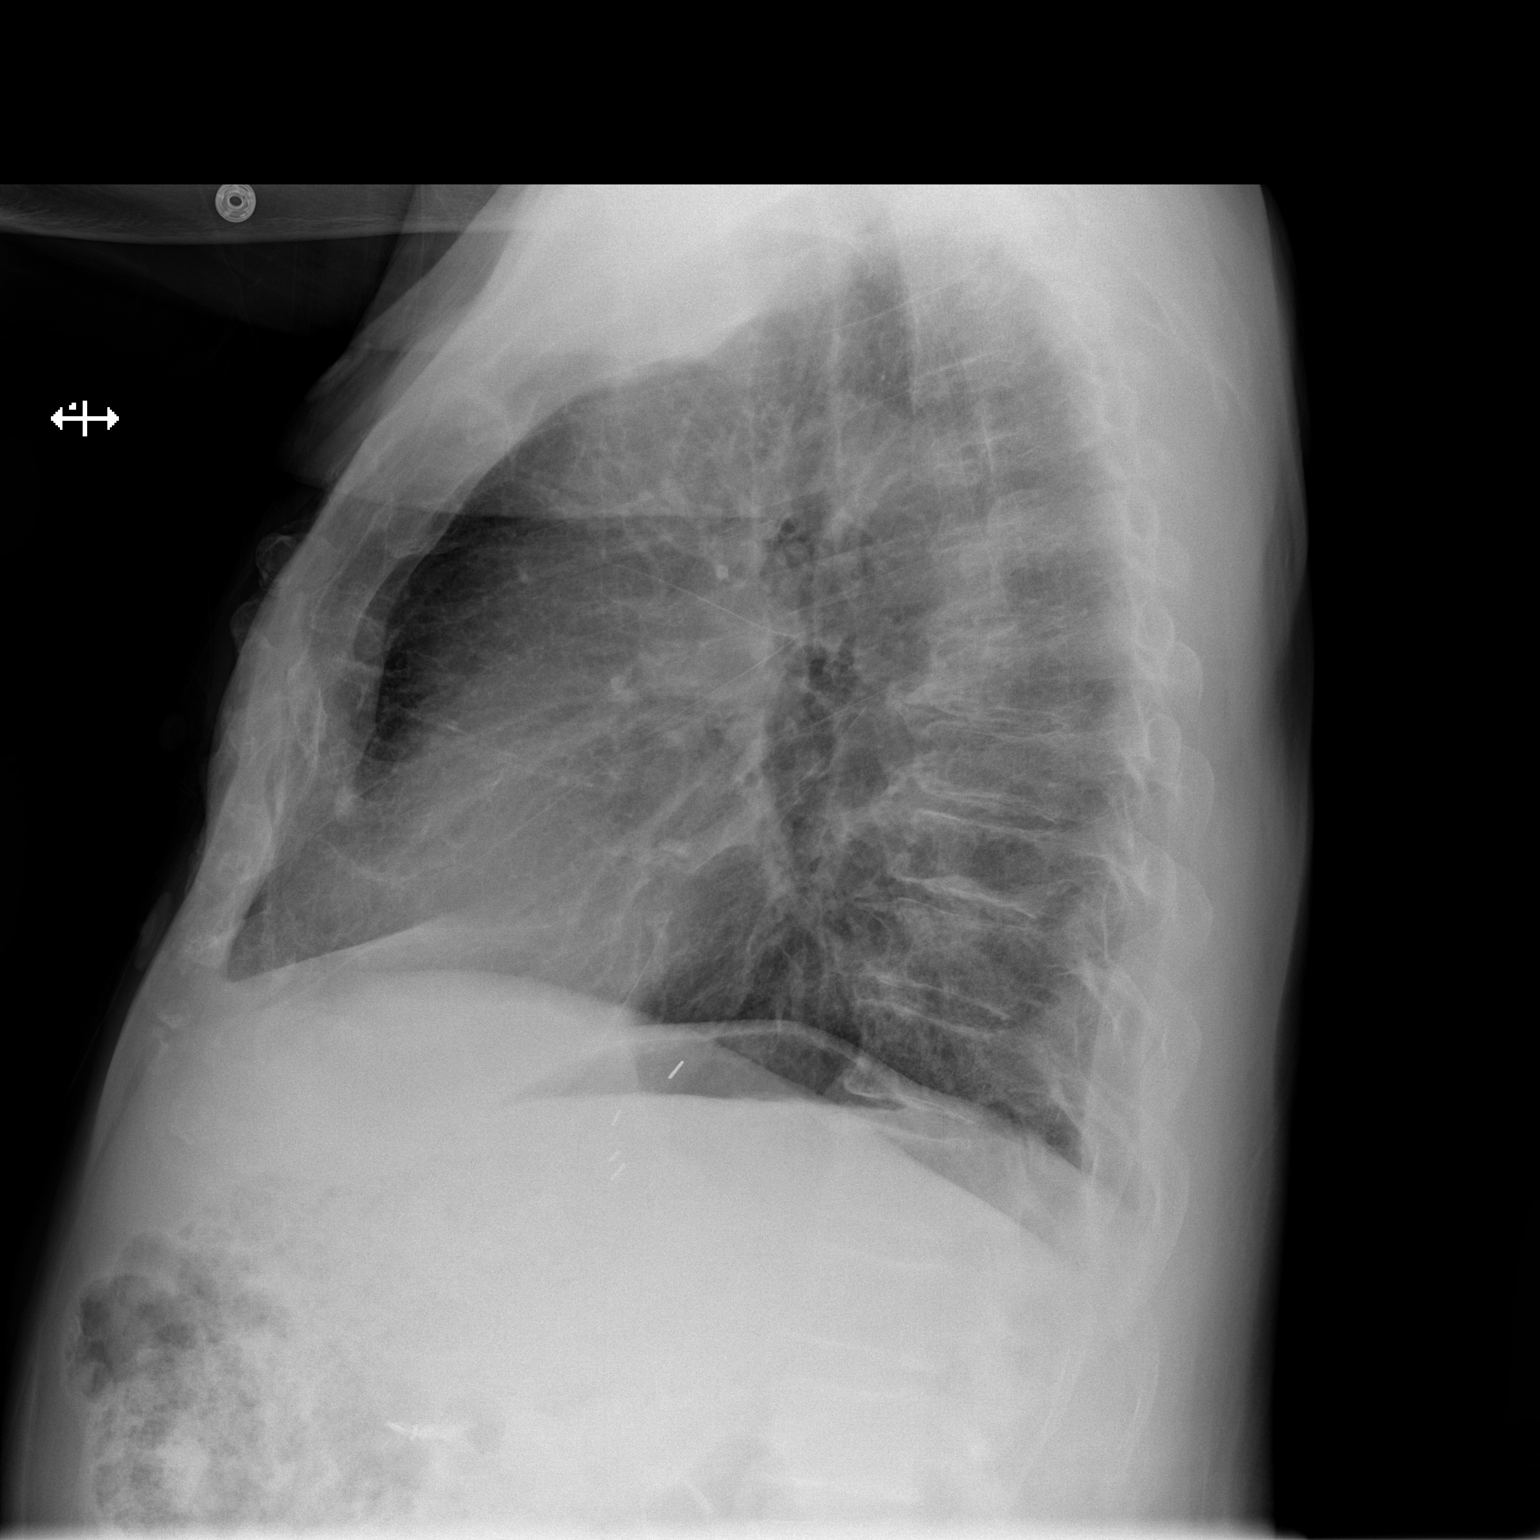

[2 of 2 positions shown; findings below may reference images not displayed]

FINDINGS: There is emphysematous changes of the lungs. No focal consolidation,
pleural effusion, or pneumothorax. The cardiac silhouette is within
normal limits. No acute osseous pathology. Surgical clips at the
gastroesophageal junction and right upper quadrant cholecystectomy
clips.
IMPRESSION: No active cardiopulmonary disease.

## 2018-10-11 NOTE — Telephone Encounter (Signed)
Left voice mail to call back 

## 2018-10-14 MED ORDER — CIPROFLOXACIN HCL 500 MG PO TABS: 500.0000 mg | ORAL_TABLET | Freq: Two times a day (BID) | ORAL | 0 refills | Status: DC

## 2018-10-14 MED ORDER — METFORMIN HCL 500 MG PO TABS: 500.0000 mg | ORAL_TABLET | Freq: Every day | ORAL | 3 refills | Status: DC

## 2018-10-14 NOTE — Telephone Encounter (Signed)
Left voicemail to call back.  "unable to contact" letter mailed. Prescriptions released

## 2018-10-17 DIAGNOSIS — R338 Other retention of urine: Secondary | ICD-10-CM | POA: Diagnosis not present

## 2018-10-19 ENCOUNTER — Inpatient Hospital Stay (HOSPITAL_COMMUNITY)
Admission: EM | Admit: 2018-10-19 | Discharge: 2018-10-23 | DRG: 243 | Disposition: A | Discharge status: Home Health | Payer: Medicare Other | Attending: Internal Medicine | Admitting: Internal Medicine

## 2018-10-19 ENCOUNTER — Encounter (HOSPITAL_COMMUNITY): Payer: Self-pay | Admitting: Emergency Medicine

## 2018-10-19 ENCOUNTER — Emergency Department (HOSPITAL_COMMUNITY): Payer: Medicare Other

## 2018-10-19 ENCOUNTER — Other Ambulatory Visit: Payer: Self-pay

## 2018-10-19 DIAGNOSIS — M1990 Unspecified osteoarthritis, unspecified site: Secondary | ICD-10-CM | POA: Diagnosis present

## 2018-10-19 DIAGNOSIS — I959 Hypotension, unspecified: Secondary | ICD-10-CM | POA: Diagnosis not present

## 2018-10-19 DIAGNOSIS — T83511A Infection and inflammatory reaction due to indwelling urethral catheter, initial encounter: Secondary | ICD-10-CM | POA: Diagnosis not present

## 2018-10-19 DIAGNOSIS — Z03818 Encounter for observation for suspected exposure to other biological agents ruled out: Secondary | ICD-10-CM | POA: Diagnosis not present

## 2018-10-19 DIAGNOSIS — D649 Anemia, unspecified: Secondary | ICD-10-CM | POA: Diagnosis present

## 2018-10-19 DIAGNOSIS — Z87442 Personal history of urinary calculi: Secondary | ICD-10-CM

## 2018-10-19 DIAGNOSIS — S0990XA Unspecified injury of head, initial encounter: Secondary | ICD-10-CM | POA: Diagnosis not present

## 2018-10-19 DIAGNOSIS — R55 Syncope and collapse: Secondary | ICD-10-CM

## 2018-10-19 DIAGNOSIS — E559 Vitamin D deficiency, unspecified: Secondary | ICD-10-CM | POA: Diagnosis not present

## 2018-10-19 DIAGNOSIS — Z903 Acquired absence of stomach [part of]: Secondary | ICD-10-CM

## 2018-10-19 DIAGNOSIS — N390 Urinary tract infection, site not specified: Secondary | ICD-10-CM | POA: Diagnosis not present

## 2018-10-19 DIAGNOSIS — I480 Paroxysmal atrial fibrillation: Secondary | ICD-10-CM | POA: Diagnosis present

## 2018-10-19 DIAGNOSIS — Z9181 History of falling: Secondary | ICD-10-CM

## 2018-10-19 DIAGNOSIS — Z7982 Long term (current) use of aspirin: Secondary | ICD-10-CM

## 2018-10-19 DIAGNOSIS — I495 Sick sinus syndrome: Principal | ICD-10-CM | POA: Diagnosis present

## 2018-10-19 DIAGNOSIS — K219 Gastro-esophageal reflux disease without esophagitis: Secondary | ICD-10-CM | POA: Diagnosis present

## 2018-10-19 DIAGNOSIS — Z841 Family history of disorders of kidney and ureter: Secondary | ICD-10-CM

## 2018-10-19 DIAGNOSIS — E782 Mixed hyperlipidemia: Secondary | ICD-10-CM | POA: Diagnosis not present

## 2018-10-19 DIAGNOSIS — Z9049 Acquired absence of other specified parts of digestive tract: Secondary | ICD-10-CM

## 2018-10-19 DIAGNOSIS — H548 Legal blindness, as defined in USA: Secondary | ICD-10-CM | POA: Diagnosis present

## 2018-10-19 DIAGNOSIS — R001 Bradycardia, unspecified: Secondary | ICD-10-CM | POA: Diagnosis not present

## 2018-10-19 DIAGNOSIS — F329 Major depressive disorder, single episode, unspecified: Secondary | ICD-10-CM | POA: Diagnosis present

## 2018-10-19 DIAGNOSIS — Z20828 Contact with and (suspected) exposure to other viral communicable diseases: Secondary | ICD-10-CM | POA: Diagnosis present

## 2018-10-19 DIAGNOSIS — I4891 Unspecified atrial fibrillation: Secondary | ICD-10-CM

## 2018-10-19 DIAGNOSIS — I5032 Chronic diastolic (congestive) heart failure: Secondary | ICD-10-CM | POA: Diagnosis not present

## 2018-10-19 DIAGNOSIS — I2510 Atherosclerotic heart disease of native coronary artery without angina pectoris: Secondary | ICD-10-CM | POA: Diagnosis not present

## 2018-10-19 DIAGNOSIS — E119 Type 2 diabetes mellitus without complications: Secondary | ICD-10-CM | POA: Diagnosis present

## 2018-10-19 DIAGNOSIS — F17210 Nicotine dependence, cigarettes, uncomplicated: Secondary | ICD-10-CM | POA: Diagnosis present

## 2018-10-19 DIAGNOSIS — Z8249 Family history of ischemic heart disease and other diseases of the circulatory system: Secondary | ICD-10-CM

## 2018-10-19 DIAGNOSIS — R42 Dizziness and giddiness: Secondary | ICD-10-CM | POA: Diagnosis not present

## 2018-10-19 DIAGNOSIS — W19XXXA Unspecified fall, initial encounter: Secondary | ICD-10-CM | POA: Diagnosis not present

## 2018-10-19 DIAGNOSIS — Z959 Presence of cardiac and vascular implant and graft, unspecified: Secondary | ICD-10-CM

## 2018-10-19 DIAGNOSIS — J449 Chronic obstructive pulmonary disease, unspecified: Secondary | ICD-10-CM | POA: Diagnosis present

## 2018-10-19 DIAGNOSIS — E785 Hyperlipidemia, unspecified: Secondary | ICD-10-CM | POA: Diagnosis present

## 2018-10-19 DIAGNOSIS — Z8711 Personal history of peptic ulcer disease: Secondary | ICD-10-CM

## 2018-10-19 DIAGNOSIS — I484 Atypical atrial flutter: Secondary | ICD-10-CM | POA: Diagnosis not present

## 2018-10-19 DIAGNOSIS — R52 Pain, unspecified: Secondary | ICD-10-CM | POA: Diagnosis not present

## 2018-10-19 DIAGNOSIS — Z8701 Personal history of pneumonia (recurrent): Secondary | ICD-10-CM

## 2018-10-19 DIAGNOSIS — Z833 Family history of diabetes mellitus: Secondary | ICD-10-CM

## 2018-10-19 DIAGNOSIS — I110 Hypertensive heart disease with heart failure: Secondary | ICD-10-CM | POA: Diagnosis present

## 2018-10-19 DIAGNOSIS — Z888 Allergy status to other drugs, medicaments and biological substances status: Secondary | ICD-10-CM

## 2018-10-19 DIAGNOSIS — Z7984 Long term (current) use of oral hypoglycemic drugs: Secondary | ICD-10-CM

## 2018-10-19 DIAGNOSIS — Z806 Family history of leukemia: Secondary | ICD-10-CM

## 2018-10-19 DIAGNOSIS — Z8673 Personal history of transient ischemic attack (TIA), and cerebral infarction without residual deficits: Secondary | ICD-10-CM

## 2018-10-19 DIAGNOSIS — Z79899 Other long term (current) drug therapy: Secondary | ICD-10-CM

## 2018-10-19 DIAGNOSIS — N400 Enlarged prostate without lower urinary tract symptoms: Secondary | ICD-10-CM | POA: Diagnosis present

## 2018-10-19 DIAGNOSIS — Z9841 Cataract extraction status, right eye: Secondary | ICD-10-CM

## 2018-10-19 DIAGNOSIS — Z803 Family history of malignant neoplasm of breast: Secondary | ICD-10-CM

## 2018-10-19 DIAGNOSIS — F419 Anxiety disorder, unspecified: Secondary | ICD-10-CM | POA: Diagnosis present

## 2018-10-19 DIAGNOSIS — Z961 Presence of intraocular lens: Secondary | ICD-10-CM | POA: Diagnosis present

## 2018-10-19 DIAGNOSIS — Z9842 Cataract extraction status, left eye: Secondary | ICD-10-CM

## 2018-10-19 DIAGNOSIS — E869 Volume depletion, unspecified: Secondary | ICD-10-CM | POA: Diagnosis present

## 2018-10-19 LAB — URINALYSIS, ROUTINE W REFLEX MICROSCOPIC
Bilirubin Urine: NEGATIVE
Glucose, UA: NEGATIVE mg/dL
Ketones, ur: NEGATIVE mg/dL
Nitrite: NEGATIVE
Protein, ur: 30 mg/dL — AB
Specific Gravity, Urine: 1.016 (ref 1.005–1.030)
WBC, UA: >50 WBC/hpf — ABNORMAL HIGH (ref 0–5)
pH: 5 (ref 5.0–8.0)

## 2018-10-19 LAB — CBG MONITORING, ED: Glucose-Capillary: 214 mg/dL — ABNORMAL HIGH (ref 70–99)

## 2018-10-19 LAB — BASIC METABOLIC PANEL
Anion gap: 9 (ref 5–15)
BUN: 16 mg/dL (ref 8–23)
CO2: 23 mmol/L (ref 22–32)
Calcium: 9.3 mg/dL (ref 8.9–10.3)
Chloride: 104 mmol/L (ref 98–111)
Creatinine, Ser: 1.2 mg/dL (ref 0.61–1.24)
GFR calc Af Amer: >60 mL/min (ref >60)
GFR calc non Af Amer: 57 mL/min — ABNORMAL LOW (ref >60)
Glucose, Bld: 236 mg/dL — ABNORMAL HIGH (ref 70–99)
Potassium: 4.2 mmol/L (ref 3.5–5.1)
Sodium: 136 mmol/L (ref 135–145)

## 2018-10-19 LAB — CBC
HCT: 34.8 % — ABNORMAL LOW (ref 39.0–52.0)
Hemoglobin: 10.5 g/dL — ABNORMAL LOW (ref 13.0–17.0)
MCH: 26.6 pg (ref 26.0–34.0)
MCHC: 30.2 g/dL (ref 30.0–36.0)
MCV: 88.1 fL (ref 80.0–100.0)
Platelets: 226 10*3/uL (ref 150–400)
RBC: 3.95 MIL/uL — ABNORMAL LOW (ref 4.22–5.81)
RDW: 15.2 % (ref 11.5–15.5)
WBC: 4.7 10*3/uL (ref 4.0–10.5)
nRBC: 0 % (ref 0.0–0.2)

## 2018-10-19 LAB — SARS CORONAVIRUS 2 BY RT PCR (HOSPITAL ORDER, PERFORMED IN ~~LOC~~ HOSPITAL LAB): SARS Coronavirus 2: NEGATIVE

## 2018-10-19 MED ORDER — ENOXAPARIN SODIUM 40 MG/0.4ML ~~LOC~~ SOLN
40.0000 mg | SUBCUTANEOUS | Status: DC
  Administered 2018-10-20 – 2018-10-22 (×4): 40 mg via SUBCUTANEOUS
  Filled 2018-10-19 (×4): qty 0.4

## 2018-10-19 MED ORDER — SODIUM CHLORIDE 0.9% FLUSH
3.0000 mL | Freq: Once | INTRAVENOUS | Status: AC
  Administered 2018-10-20: 3 mL via INTRAVENOUS

## 2018-10-19 MED ORDER — PANTOPRAZOLE SODIUM 40 MG PO TBEC
40.0000 mg | DELAYED_RELEASE_TABLET | Freq: Two times a day (BID) | ORAL | Status: DC
  Administered 2018-10-20 – 2018-10-23 (×6): 40 mg via ORAL
  Filled 2018-10-19 (×6): qty 1

## 2018-10-19 MED ORDER — SODIUM CHLORIDE 0.9 % IV SOLN
1.0000 g | Freq: Once | INTRAVENOUS | Status: AC
  Administered 2018-10-19: 1 g via INTRAVENOUS
  Filled 2018-10-19: qty 10

## 2018-10-19 MED ORDER — SODIUM CHLORIDE 0.9 % IV SOLN
1.0000 g | INTRAVENOUS | Status: DC
  Administered 2018-10-20 – 2018-10-22 (×3): 1 g via INTRAVENOUS
  Filled 2018-10-19 (×3): qty 10

## 2018-10-19 MED ORDER — SODIUM CHLORIDE 0.9 % IV SOLN
INTRAVENOUS | Status: DC
  Administered 2018-10-19 – 2018-10-20 (×2): via INTRAVENOUS

## 2018-10-19 NOTE — ED Triage Notes (Signed)
Per EMS- pt here for eval of syncope followed by a fall from a recliner on to carpeted floor, has pain to back of head. Pt is in afib at a controlled rate. Denies chest pain. CBG 209. BP 120/66. Pt is not on blood thinners.

## 2018-10-19 NOTE — ED Provider Notes (Signed)
[facility] EMERGENCY DEPARTMENT Provider Note   CSN: [id number] Arrival date & time: 10/19/18  1859     History   Chief Complaint Chief Complaint  Patient presents with  . Loss of Consciousness    HPI [name] is a 78 y.o. male.     HPI   78 year old male with what sounds like syncope.  Patient states that after dinner this evening he went to sit down.  He thought that he may have fallen asleep but reports that his daughter told that she thinks he actually lost consciousness.  He did fall.  Did strike his head.  No LOC from this fall.  Denies any similar pain.  Previously anticoagulated for history of atrial fibrillation but is off of blood thinners secondary to history of GI bleed.  Past Medical History:  Diagnosis Date  . Allergic rhinitis   . Anemia    takes Ferrous Sulfate daily  . Arthritis    "all over"  . Atrial flutter ([place])   . Balance problem 01/2014  . CAP (community acquired pneumonia) 09/18/2014  . Cervical radiculopathy due to degenerative joint disease of spine   . Chronic coronary artery disease    a. 03/2010 Nonocclusive disease by cath, performed for ST elevations on ECG;  b. 06/2013 Lexi MV: EF 60%, no ischemia.  [name] COPD (chronic obstructive pulmonary disease) ([place])   . Diabetes mellitus type II    takes Metformin and Lantus daily  . Diastolic CHF, chronic ([place])    a. 12/2012 EF 55-60%, diast dysfxn, triv MR, mildly dil LA/RA.  [name] Dyspnea    with pain  . Essential hypertension    takes Diltiazem daily  . Frail elderly   . GERD (gastroesophageal reflux disease)   . History of blood transfusion 1982   "when I had stomach OR"  . History of bronchitis    1998  . History of CVA (cerebrovascular accident)   . History of gastric ulcer   . History of kidney stones   . Hypercholesterolemia    takes Pravastatin daily  . Intrinsic eczema   . Legally blind in left eye, as defined in [place]   . Obesity   . PAF (paroxysmal atrial  fibrillation) (HCC)    Recurrent after atrial flutter (a. 07/2010 Status post caval tricuspid isthmus ablation by Dr. Midge Aver Metoprolol daily), currently controlled on flecainide plus diltiazem and Coumadin  . Urinary retention   . Vitamin D deficiency   . Weakness    numbness and tingling both hands    Patient Active Problem List   Diagnosis Date Noted  . Major depressive disorder, recurrent severe without psychotic features ([place]) 04/15/2018  . Depression   . Rapid atrial fibrillation ([place]) 01/18/2018  . Atrial fibrillation with RVR ([place]) 11/08/2017  . Chest pain with moderate risk of acute coronary syndrome 10/04/2017  . HTN (hypertension) 05/21/2017  . Diabetes mellitus type II 05/21/2017  . Diastolic CHF, chronic ([place]) 05/21/2017  . COPD (chronic obstructive pulmonary disease) ([place]) 05/21/2017  . H/O atrial flutter 05/21/2017  . Coronary disease/nonobstructive 05/21/2017  . Acute blood loss anemia 10/02/2016  . Abdominal pain 10/02/2016  . Constipation 10/02/2016  . Left lower quadrant pain   . Coagulopathy ([place])   . Heme positive stool   . Symptomatic anemia 07/10/2016  . Syncope 05/15/2015  . Cervical disc disease 05/15/2015  . Abnormal urinalysis 05/15/2015  . Anemia 05/15/2015  . History of CVA (cerebrovascular accident) 04/03/2014  . Near syncope 02/15/2014  .  Pre-syncope 02/15/2014  . Benign neoplasm of rectum and anal canal 12/02/2013  . Upper GI bleeding 11/30/2013  . Tobacco use disorder 11/30/2013  . Anticoagulation goal of INR 2 to 3, for PAF - CHA2DS2Vasc = 7 08/15/2013  . Type 2 diabetes mellitus ([place]) 01/22/2013  . Tobacco abuse counseling 12/15/2012  . Hyperlipidemia   . Obesity (BMI 30-39.9) 12/14/2012  . Persistent atrial fibrillation ([place]) 10/07/2010  . Atrial flutter - status post CTI ablation 07/29/2010  . Essential hypertension 07/29/2010  . Chronic diastolic congestive heart failure ([place]) 07/29/2010  . Coronary artery disease,  non-occlusive 07/29/2010    Past Surgical History:  Procedure Laterality Date  . ATRIAL ABLATION SURGERY  08/05/10   CTI ablation for atrial flutter by JA  . CARDIAC CATHETERIZATION  2012   nl LV function, no occlusive CAD, PAF  . CARDIOVERSION  12/07/2010    Successful direct current cardioversion with atrial fibrillation to normal sinus rhythm  . CARPAL TUNNEL RELEASE Bilateral 01/30/2014   Procedure: BILATERAL CARPAL TUNNEL RELEASE;  Surgeon: Marianna Payment, MD;  Location: [facility];  Service: Orthopedics;  Laterality: Bilateral;  . CATARACT EXTRACTION W/ INTRAOCULAR LENS  IMPLANT, BILATERAL Bilateral   . COLONOSCOPY [name] 12/02/2013   Procedure: COLONOSCOPY;  Surgeon: Irene Shipper, MD;  Location: [facility];  Service: Endoscopy;  Laterality: [name];  . ESOPHAGOGASTRODUODENOSCOPY [name] 09/22/2014   Procedure: ESOPHAGOGASTRODUODENOSCOPY (EGD);  Surgeon: Ronald Lobo, MD;  Location: [facility] ENDOSCOPY;  Service: Endoscopy;  Laterality: [name];  . ESOPHAGOGASTRODUODENOSCOPY [name] 07/11/2016   Procedure: ESOPHAGOGASTRODUODENOSCOPY (EGD);  Surgeon: Doran Stabler, MD;  Location: [facility] ENDOSCOPY;  Service: Endoscopy;  Laterality: [name];  . EXTRACORPOREAL SHOCK WAVE LITHOTRIPSY Right 07/15/2018   Procedure: EXTRACORPOREAL SHOCK WAVE LITHOTRIPSY (ESWL);  Surgeon: Lucas Mallow, MD;  Location: [facility];  Service: Urology;  Laterality: Right;  . INCISION AND DRAINAGE ABSCESS / HEMATOMA OF BURSA / KNEE / THIGH Left 1998   knee  . KNEE BURSECTOMY Left 1998  . LAPAROSCOPIC CHOLECYSTECTOMY  03/2010  . LEFT HEART CATH AND CORONARY ANGIOGRAPHY [name] 10/05/2017   Procedure: LEFT HEART CATH AND CORONARY ANGIOGRAPHY;  Surgeon: Martinique, Peter M, MD;  Location: [facility];  Service: Cardiovascular;  Laterality: [name];  . NM MYOVIEW LTD  07/22/2013   Normal EF ~60%, no ischemia or infarction.  [name] PARTIAL GASTRECTOMY  1982   subtotal; "took out 30% for ulcers"  . TRANSTHORACIC ECHOCARDIOGRAM  02/16/2014   EF 60%, no RWMA. -  otherwise normal  . YAG LASER APPLICATION Bilateral         Home Medications    Prior to Admission medications   Medication Sig Start Date End Date Taking? Authorizing Provider  aspirin EC 81 MG tablet Take 81 mg by mouth daily.    [provider]  ciprofloxacin (CIPRO) 500 MG tablet Take 1 tablet (500 mg total) by mouth 2 (two) times daily for 7 days. 10/14/18 10/21/18  Scot Jun, FNP  citalopram (CELEXA) 20 MG tablet Take 1 tablet (20 mg total) by mouth daily. 10/02/18   Scot Jun, FNP  co-enzyme Q-10 30 MG capsule Take 30 mg by mouth daily.     [provider]  diclofenac sodium (VOLTAREN) 1 % GEL Apply 4 g topically 3 (three) times daily as needed (for back pain). 10/02/18   Scot Jun, FNP  diltiazem (CARDIZEM CD) 360 MG 24 hr capsule Take 1 capsule (360 mg total) by mouth daily. 10/02/18   Molli Barrows  S, FNP  ferrous sulfate 325 (65 FE) MG tablet Take 325 mg by mouth daily with breakfast.    [provider]  hydrocortisone cream 1 % Apply 1 application topically 2 (two) times daily. 10/02/18   Scot Jun, FNP  metFORMIN (GLUCOPHAGE) 500 MG tablet Take 1 tablet (500 mg total) by mouth daily with breakfast. 10/14/18   Scot Jun, FNP  nitroGLYCERIN (NITROSTAT) 0.4 MG SL tablet Place 1 tablet (0.4 mg total) under the tongue every 5 (five) minutes x 3 doses as needed for chest pain. 11/19/15   Erlene Quan, PA-C  pantoprazole (PROTONIX) 40 MG tablet Take 1 tablet (40 mg total) by mouth 2 (two) times daily before a meal. 10/02/18   Scot Jun, FNP  pravastatin (PRAVACHOL) 40 MG tablet Take 1 tablet (40 mg total) by mouth every evening. 10/02/18   Scot Jun, FNP  tamsulosin (FLOMAX) 0.4 MG CAPS capsule Take 1 capsule (0.4 mg total) by mouth daily. 10/02/18   Scot Jun, FNP    Family History Family History  Problem Relation Age of Onset  . Breast cancer Mother   . Diabetes Mother   . Kidney disease  Mother   . Heart attack Father   . Heart disease Father   . Heart attack Brother   . Leukemia Daughter     Social History Social History   Tobacco Use  . Smoking status: Current Every Day Smoker    Packs/day: 0.25    Years: 57.00    Pack years: 14.25    Types: Cigarettes  . Smokeless tobacco: Never Used  . Tobacco comment: He's been smoking between 0.5 and 2 ppd since age 60.  Substance Use Topics  . Alcohol use: No    Alcohol/week: 0.0 standard drinks    Comment: "quit drinking in 1986"  . Drug use: No     Allergies   Cyclobenzaprine   Review of Systems Review of Systems  All systems reviewed and negative, other than as noted in HPI.  Physical Exam Updated Vital Signs BP 101/64   Pulse 65   Temp (!) 97.5 F (36.4 C) (Oral)   Resp 18   SpO2 99%   Physical Exam Vitals signs and nursing note reviewed.  Constitutional:      General: He is not in acute distress.    Appearance: He is well-developed.  HENT:     Head: Normocephalic and atraumatic.  Eyes:     General:        Right eye: No discharge.        Left eye: No discharge.     Conjunctiva/sclera: Conjunctivae normal.  Neck:     Musculoskeletal: Neck supple.  Cardiovascular:     Rate and Rhythm: Normal rate. Rhythm irregular.     Heart sounds: Normal heart sounds. No murmur. No friction rub. No gallop.   Pulmonary:     Effort: Pulmonary effort is normal. No respiratory distress.     Breath sounds: Normal breath sounds.  Abdominal:     General: There is no distension.     Palpations: Abdomen is soft.     Tenderness: There is no abdominal tenderness.  Musculoskeletal:        General: No tenderness.  Skin:    General: Skin is warm and dry.  Neurological:     Mental Status: He is alert.  Psychiatric:        Behavior: Behavior normal.        Thought  Content: Thought content normal.      ED Treatments / Results  Labs (all labs ordered are listed, but only abnormal results are displayed) Labs  Reviewed  BASIC METABOLIC PANEL - Abnormal; Notable for the following components:      Result Value   Glucose, Bld 236 (*)    GFR calc non Af Amer 57 (*)    All other components within normal limits  CBC - Abnormal; Notable for the following components:   RBC 3.95 (*)    Hemoglobin 10.5 (*)    HCT 34.8 (*)    All other components within normal limits  URINALYSIS, ROUTINE W REFLEX MICROSCOPIC - Abnormal; Notable for the following components:   APPearance CLOUDY (*)    Hgb urine dipstick SMALL (*)    Protein, ur 30 (*)    Leukocytes,Ua LARGE (*)    WBC, UA >50 (*)    Bacteria, UA RARE (*)    All other components within normal limits  CBG MONITORING, ED - Abnormal; Notable for the following components:   Glucose-Capillary 214 (*)    All other components within normal limits  SARS CORONAVIRUS 2 (HOSPITAL ORDER, [facility])  URINE CULTURE  MAGNESIUM    EKG EKG Interpretation  Date/Time:  Saturday October 19 2018 19:05:10 EDT Ventricular Rate:  51 PR Interval:    QRS Duration: 76 QT Interval:  402 QTC Calculation: 370 R Axis:   2 Text Interpretation:  Atrial flutter with variable A-V block with ventricular escape complexes Abnormal ECG Confirmed by Ripley Fraise [phone]) on 10/20/2018 10:17:39 AM   Radiology Dg Chest 2 View  Result Date: 10/22/2018 CLINICAL DATA:  Patient status post pacemaker placement 10/21/2018. EXAM: CHEST - 2 VIEW COMPARISON:  PA and lateral chest 07/10/2018 FINDINGS: New dual lead pacing device is in place with tips of the leads in the right atrium and right ventricle. Leads are intact. Generator is over the upper left chest. Lungs are clear. No pneumothorax or pleural effusion. Emphysema noted. Heart size is normal. Aortic atherosclerosis is identified. No acute or focal bony abnormality. IMPRESSION: Pacing leads project in the right atrium and right ventricle. Negative for pneumothorax or acute disease. Emphysema. Atherosclerosis.  Electronically Signed   By: Inge Rise M.D.   On: 10/22/2018 08:55    Procedures Procedures (including critical care time)  Medications Ordered in ED Medications  sodium chloride flush (NS) 0.9 % injection 3 mL (has no administration in time range)     Initial Impression / Assessment and Plan / ED Course  I have reviewed the triage vital signs and the nursing notes.  Pertinent labs & imaging results that were available during my care of the patient were reviewed by me and considered in my medical decision making (see chart for details).      78yM with syncope. EKG as above. Afib with slow VR. Rate mostly ~50 but lower on occasion with venticular escape beats. Feels ok at rest currently but says he has been feeling lightheaded with activity recently. It sounds like PPM previously considered but then patient canceled? Will discuss with cardiology. Incidental UTI noted. Abx. Will discuss with medicine for admission.   Final Clinical Impressions(s) / ED Diagnoses   Final diagnoses:  Atrial fibrillation, unspecified type [facility])  symptomatic bradycardia  ED Discharge Orders    None       Virgel Manifold, MD 10/22/18 2113

## 2018-10-19 NOTE — ED Notes (Signed)
Report given to Raquel Sarna, RN on 3E

## 2018-10-19 NOTE — ED Notes (Signed)
ED TO INPATIENT HANDOFF REPORT  ED Nurse Name and Phone #: Lorrin Goodell [phone]  S Name/Age/Gender [name] 78 y.o. male Room/Bed: 042C/042C  Code Status   Code Status: Prior  Home/SNF/Other Home Patient oriented to: self, place, time and situation Is this baseline? Yes   Triage Complete: Triage complete  Chief Complaint syncope  Triage Note Per EMS- pt here for eval of syncope followed by a fall from a recliner on to carpeted floor, has pain to back of head. Pt is in afib at a controlled rate. Denies chest pain. CBG 209. BP 120/66. Pt is not on blood thinners.    Allergies Allergies  Allergen Reactions  . Cyclobenzaprine Other (See Comments)    Unsteady gait  . Other Other (See Comments)    Steroids speeds his heart rate    Level of Care/Admitting Diagnosis ED Disposition    ED Disposition Condition [facility] Area: [place] [[id number]]  Level of Care: Telemetry Medical [104]  I expect the patient will be discharged within 24 hours: No (not a candidate for 5C-Observation unit)  Covid Evaluation: Asymptomatic Screening Protocol (No Symptoms)  Diagnosis: Syncope [206001]  Admitting Physician: Bonnell Public [[id number]]  Attending Physician: Dana Allan I [[id number]]  PT Class (Do Not Modify): Observation [104]  PT Acc Code (Do Not Modify): Observation [10022]       B Medical/Surgery History Past Medical History:  Diagnosis Date  . Allergic rhinitis   . Anemia    takes Ferrous Sulfate daily  . Arthritis    "all over"  . Atrial flutter ([place])   . Balance problem 01/2014  . CAP (community acquired pneumonia) 09/18/2014  . Cervical radiculopathy due to degenerative joint disease of spine   . Chronic coronary artery disease    a. 03/2010 Nonocclusive disease by cath, performed for ST elevations on ECG;  b. 06/2013 Lexi MV: EF 60%, no ischemia.  [name] COPD (chronic obstructive pulmonary disease) ([place])   . Diabetes mellitus type II     takes Metformin and Lantus daily  . Diastolic CHF, chronic ([place])    a. 12/2012 EF 55-60%, diast dysfxn, triv MR, mildly dil LA/RA.  [name] Dyspnea    with pain  . Essential hypertension    takes Diltiazem daily  . Frail elderly   . GERD (gastroesophageal reflux disease)   . History of blood transfusion 1982   "when I had stomach OR"  . History of bronchitis    1998  . History of CVA (cerebrovascular accident)   . History of gastric ulcer   . History of kidney stones   . Hypercholesterolemia    takes Pravastatin daily  . Intrinsic eczema   . Legally blind in left eye, as defined in [place]   . Obesity   . PAF (paroxysmal atrial fibrillation) (H[facility])    Recurrent after atrial flutter (a. 07/2010 Status post caval tricuspid isthmus ablation by Dr. Midge Aver Metoprolol daily), currently controlled on flecainide plus diltiazem and Coumadin  . Urinary retention   . Vitamin D deficiency   . Weakness    numbness and tingling both hands   Past Surgical History:  Procedure Laterality Date  . ATRIAL ABLATION SURGERY  08/05/10   CTI ablation for atrial flutter by JA  . CARDIAC CATHETERIZATION  2012   nl LV function, no occlusive CAD, PAF  . CARDIOVERSION  12/07/2010    Successful direct current cardioversion with atrial fibrillation to normal sinus rhythm  . CARPAL  TUNNEL RELEASE Bilateral 01/30/2014   Procedure: BILATERAL CARPAL TUNNEL RELEASE;  Surgeon: Marianna Payment, MD;  Location: [facility];  Service: Orthopedics;  Laterality: Bilateral;  . CATARACT EXTRACTION W/ INTRAOCULAR LENS  IMPLANT, BILATERAL Bilateral   . COLONOSCOPY [name] 12/02/2013   Procedure: COLONOSCOPY;  Surgeon: Irene Shipper, MD;  Location: [facility];  Service: Endoscopy;  Laterality: [name];  . ESOPHAGOGASTRODUODENOSCOPY [name] 09/22/2014   Procedure: ESOPHAGOGASTRODUODENOSCOPY (EGD);  Surgeon: Ronald Lobo, MD;  Location: [facility] ENDOSCOPY;  Service: Endoscopy;  Laterality: [name];  . ESOPHAGOGASTRODUODENOSCOPY [name] 07/11/2016    Procedure: ESOPHAGOGASTRODUODENOSCOPY (EGD);  Surgeon: Doran Stabler, MD;  Location: [facility]OSCOPY;  Service: Endoscopy;  Laterality: [name];  . EXTRACORPOREAL SHOCK WAVE LITHOTRIPSY Right 07/15/2018   Procedure: EXTRACORPOREAL SHOCK WAVE LITHOTRIPSY (ESWL);  Surgeon: Lucas Mallow, MD;  Location: [facility];  Service: Urology;  Laterality: Right;  . INCISION AND DRAINAGE ABSCESS / HEMATOMA OF BURSA / KNEE / THIGH Left 1998   knee  . KNEE BURSECTOMY Left 1998  . LAPAROSCOPIC CHOLECYSTECTOMY  03/2010  . LEFT HEART CATH AND CORONARY ANGIOGRAPHY [name] 10/05/2017   Procedure: LEFT HEART CATH AND CORONARY ANGIOGRAPHY;  Surgeon: Martinique, Peter M, MD;  Location: [facility] CV LAB;  Service: Cardiovascular;  Laterality: [name];  . NM MYOVIEW LTD  07/22/2013   Normal EF ~60%, no ischemia or infarction.  [name] PARTIAL GASTRECTOMY  1982   subtotal; "took out 30% for ulcers"  . TRANSTHORACIC ECHOCARDIOGRAM  02/16/2014   EF 60%, no RWMA. - otherwise normal  . YAG LASER APPLICATION Bilateral      A IV Location/Drains/Wounds Patient Lines/Drains/Airways Status   Active Line/Drains/Airways    Name:   Placement date:   Placement time:   Site:   Days:   Peripheral IV 10/19/18 Right Wrist   10/19/18    1928    Wrist   less than 1   Sheath 10/05/17 Right Arterial;Radial   10/05/17    1424    Arterial;Radial   379          Intake/Output Last 24 hours  Intake/Output Summary (Last 24 hours) at 10/19/2018 2254 Last data filed at 10/19/2018 2223 Gross per 24 hour  Intake 100 ml  Output 0 ml  Net 100 ml    Labs/Imaging Results for orders placed or performed during the hospital encounter of 10/19/18 (from the past 48 hour(s))  CBG monitoring, ED     Status: Abnormal   Collection Time: 10/19/18  7:12 PM  Result Value Ref Range   Glucose-Capillary 214 (H) 70 - 99 mg/dL   Comment 1 Notify RN    Comment 2 Document in Chart   Basic metabolic panel     Status: Abnormal   Collection Time: 10/19/18  7:15 PM  Result  Value Ref Range   Sodium 136 135 - 145 mmol/L   Potassium 4.2 3.5 - 5.1 mmol/L   Chloride 104 98 - 111 mmol/L   CO2 23 22 - 32 mmol/L   Glucose, Bld 236 (H) 70 - 99 mg/dL   BUN 16 8 - 23 mg/dL   Creatinine, Ser 1.20 0.61 - 1.24 mg/dL   Calcium 9.3 8.9 - 10.3 mg/dL   GFR calc non Af Amer 57 (L) >60 mL/min   GFR calc Af Amer >60 >60 mL/min   Anion gap 9 5 - 15    Comment: Performed at [facility], [place] [address] [place], [place] [address]  CBC     Status:  Abnormal   Collection Time: 10/19/18  7:15 PM  Result Value Ref Range   WBC 4.7 4.0 - 10.5 K/uL   RBC 3.95 (L) 4.22 - 5.81 MIL/uL   Hemoglobin 10.5 (L) 13.0 - 17.0 g/dL   HCT 34.8 (L) 39.0 - 52.0 %   MCV 88.1 80.0 - 100.0 fL   MCH 26.6 26.0 - 34.0 pg   MCHC 30.2 30.0 - 36.0 g/dL   RDW 15.2 11.5 - 15.5 %   Platelets 226 150 - 400 K/uL   nRBC 0.0 0.0 - 0.2 %    Comment: Performed at [facility], [place] [address], [place], [place] [address]  Urinalysis, Routine w reflex microscopic     Status: Abnormal   Collection Time: 10/19/18  7:33 PM  Result Value Ref Range   Color, Urine YELLOW YELLOW   APPearance CLOUDY (A) CLEAR   Specific Gravity, Urine 1.016 1.005 - 1.030   pH 5.0 5.0 - 8.0   Glucose, UA NEGATIVE NEGATIVE mg/dL   Hgb urine dipstick SMALL (A) NEGATIVE   Bilirubin Urine NEGATIVE NEGATIVE   Ketones, ur NEGATIVE NEGATIVE mg/dL   Protein, ur 30 (A) NEGATIVE mg/dL   Nitrite NEGATIVE NEGATIVE   Leukocytes,Ua LARGE (A) NEGATIVE   RBC / HPF 6-10 0 - 5 RBC/hpf   WBC, UA >50 (H) 0 - 5 WBC/hpf   Bacteria, UA RARE (A) NONE SEEN   WBC Clumps PRESENT    Mucus PRESENT     Comment: Performed at [facility], [address], [place], [place] [address]  SARS Coronavirus 2 (CEPHEID - Performed in [place] hospital lab), Hosp Order     Status: None   Collection Time: 10/19/18  7:33 PM   Specimen: Nasopharyngeal Swab  Result Value Ref Range   SARS Coronavirus 2 NEGATIVE NEGATIVE    Comment: (NOTE) If result  is NEGATIVE SARS-CoV-2 target nucleic acids are NOT DETECTED. The SARS-CoV-2 RNA is generally detectable in upper and lower  respiratory specimens during the acute phase of infection. The lowest  concentration of SARS-CoV-2 viral copies this assay can detect is 250  copies / mL. A negative result does not preclude SARS-CoV-2 infection  and should not be used as the sole basis for treatment or other  patient management decisions.  A negative result may occur with  improper specimen collection / handling, submission of specimen other  than nasopharyngeal swab, presence of viral mutation(s) within the  areas targeted by this assay, and inadequate number of viral copies  (<250 copies / mL). A negative result must be combined with clinical  observations, patient history, and epidemiological information. If result is POSITIVE SARS-CoV-2 target nucleic acids are DETECTED. The SARS-CoV-2 RNA is generally detectable in upper and lower  respiratory specimens dur ing the acute phase of infection.  Positive  results are indicative of active infection with SARS-CoV-2.  Clinical  correlation with patient history and other diagnostic information is  necessary to determine patient infection status.  Positive results do  not rule out bacterial infection or co-infection with other viruses. If result is PRESUMPTIVE POSTIVE SARS-CoV-2 nucleic acids MAY BE PRESENT.   A presumptive positive result was obtained on the submitted specimen  and confirmed on repeat testing.  While 2019 novel coronavirus  (SARS-CoV-2) nucleic acids may be present in the submitted sample  additional confirmatory testing may be necessary for epidemiological  and / or clinical management purposes  to differentiate between  SARS-CoV-2 and other Sarbecovirus currently  known to infect humans.  If clinically indicated additional testing with an alternate test  methodology [phone]) is advised. The SARS-CoV-2 RNA is generally   detectable in upper and lower respiratory sp ecimens during the acute  phase of infection. The expected result is Negative. Fact Sheet for Patients:  StrictlyIdeas.no Fact Sheet for Healthcare Providers: BankingDealers.co.za This test is not yet approved or cleared by the [place] FDA and has been authorized for detection and/or diagnosis of SARS-CoV-2 by FDA under an Emergency Use Authorization (EUA).  This EUA will remain in effect (meaning this test can be used) for the duration of the COVID-19 declaration under Section 564(b)(1) of the Act, 21 U.S.C. section 360bbb-3(b)(1), unless the authorization is terminated or revoked sooner. Performed at [facility], [place] [address] [place], [place] [address]    Ct Head Wo Contrast  Result Date: 10/19/2018 CLINICAL DATA:  Head trauma.  Ataxia. EXAM: CT HEAD WITHOUT CONTRAST TECHNIQUE: Contiguous axial images were obtained from the base of the skull through the vertex without intravenous contrast. COMPARISON:  CT head dated April 14, 2018 FINDINGS: Brain: No evidence of acute infarction, hemorrhage, hydrocephalus, extra-axial collection or mass lesion/mass effect. Volume loss and chronic microvascular ischemic changes are noted. There is an old infarct in the left cerebellum. Vascular: No hyperdense vessel or unexpected calcification. Skull: Normal. Negative for fracture or focal lesion. Sinuses/Orbits: No acute finding. Other: None. IMPRESSION: No acute intracranial abnormality. Electronically Signed   By: Constance Holster M.D.   On: 10/19/2018 21:17    Pending Labs Unresulted Labs (From admission, onward)    Start     Ordered   10/19/18 2032  Urine culture  ONCE - STAT,   STAT     10/19/18 2031   10/19/18 1933  Magnesium  Add-on,   AD     10/19/18 1932   Signed and Held  CBC  (enoxaparin (LOVENOX)    CrCl >/= 30 ml/min)  Once,   R    Comments: Baseline for enoxaparin therapy IF  NOT ALREADY DRAWN.  Notify MD if PLT < 100 K.    Signed and Held   Signed and Held  Creatinine, serum  (enoxaparin (LOVENOX)    CrCl >/= 30 ml/min)  Once,   R    Comments: Baseline for enoxaparin therapy IF NOT ALREADY DRAWN.    Signed and Held   Signed and Held  Creatinine, serum  (enoxaparin (LOVENOX)    CrCl >/= 30 ml/min)  Weekly,   R    Comments: while on enoxaparin therapy    Signed and Held   Signed and Held  Magnesium  Once,   R     Signed and Held   Signed and Held  Phosphorus  Once,   R     Signed and Held   Signed and Held  TSH  Once,   R     Signed and Held   Signed and Held  Urine culture  Once,   R     Signed and Held   Signed and Held  Occult blood card to lab, stool  Once,   R     Signed and [employer] and Occupational hygienist morning,   R     Signed and Held   Signed and Held  CBC  Tomorrow morning,   R     Signed and Held   Signed and Held  Lipase, blood  Once,  R     Signed and Held          Vitals/Pain Today's Vitals   10/19/18 2200 10/19/18 2215 10/19/18 2230 10/19/18 2245  BP: 133/68 115/81 136/74 122/74  Pulse:  80    Resp: 19 20 19 17   Temp:      TempSrc:      SpO2:  96%    PainSc:        Isolation Precautions No active isolations  Medications Medications  sodium chloride flush (NS) 0.9 % injection 3 mL (has no administration in time range)  cefTRIAXone (ROCEPHIN) 1 g in sodium chloride 0.9 % 100 mL IVPB (0 g Intravenous Stopped 10/19/18 2223)    Mobility walks High fall risk   Focused Assessments Cardiac Assessment Handoff:  Cardiac Rhythm: Atrial fibrillation, Atrial flutter Lab Results  Component Value Date   CKTOTAL 145 12/04/2011   CKMB 2.2 12/04/2011   TROPONINI <0.03 07/10/2018   Lab Results  Component Value Date   DDIMER <0.27 10/28/2017   Does the Patient currently have chest pain? No     R Recommendations: See Admitting Provider Note  Report given to:   Additional Notes:

## 2018-10-19 NOTE — ED Notes (Signed)
Pt is A-flutter on monitor and brady

## 2018-10-19 NOTE — ED Notes (Signed)
Patient transported to CT 

## 2018-10-19 NOTE — H&P (Signed)
History and Physical  [name] VHQ:[id number] DOB: Oct 05, 1940 DOA: 10/19/2018  Referring physician: ER provider PCP: Scot Jun, FNP  Outpatient Specialists:    Patient coming from: Home  Chief Complaint: Syncope  HPI:  Patient is a 78 year old male with past medical history significant for COPD, GI bleed, chronic congestive heart failure (diastolic), coronary artery disease, diabetes mellitus, hypertension, hyperlipidemia, paroxysmal atrial fibrillation/flutter with tachybradycardia syndrome and pauses not on anticoagulation due to GI bleed and arthritis.  Apparently, option of permanent pacemaker placement has been broached in the past.  Patient reported initially feeling well after dinner tonight, but may have passed out and was found on the floor by the daughter.  Patient could not recall events leading to the fall.  Significant slow A. fib was noted.  The daughter called EMS and patient was transferred to the hospital.  No headache, no neck pain, no fever or chills, no chest pain, no shortness of breath, no vomiting no urinary symptoms.  However, patient reports nausea and vague upper abdominal discomfort.  Patient reported a history of prior nephrolithiasis.  Patient be admitted for further assessment and management.  Cardiology team is already consulted on the patient.  ED Course: On presentation to the emergency department, temperature was 97.5, heart rate ranges from 35 bpm to 65 bpm, respiratory rate of 21, blood pressure 118/64 mmHg and O2 sat of 95%.  Labs includes hemoglobin of 10.5 g/dL, serum creatinine of 1.2 (up from 0.9), blood sugar of 236, negative rapid COVID testing, UA today suggestive of possible UTI (large leukocytosis, small hemoglobin, urine WBC of greater than 50).  CT head has not shown any acute abnormalities.  Pertinent labs: Chemistry reveals sodium of 136, potassium of 4.2, chloride 104, CO2 23, BUN of 16, creatinine of 1.2 blood sugar of 236.  CBC  reveals WBC of 4.7, hemoglobin of 10.5, hematocrit of 34.8, MCV of 88.1 with platelet count of 226.  UA is as documented above.  EKG: Independently reviewed.   Imaging: independently reviewed.   Review of Systems:  Negative for fever, visual changes, sore throat, rash, new muscle aches, chest pain, SOB, dysuria, bleeding, n/v/abdominal pain.  Past Medical History:  Diagnosis Date  . Allergic rhinitis   . Anemia    takes Ferrous Sulfate daily  . Arthritis    "all over"  . Atrial flutter ([name])   . Balance problem 01/2014  . CAP (community acquired pneumonia) 09/18/2014  . Cervical radiculopathy due to degenerative joint disease of spine   . Chronic coronary artery disease    a. 03/2010 Nonocclusive disease by cath, performed for ST elevations on ECG;  b. 06/2013 Lexi MV: EF 60%, no ischemia.  [name] COPD (chronic obstructive pulmonary disease) ([place])   . Diabetes mellitus type II    takes Metformin and Lantus daily  . Diastolic CHF, chronic ([place])    a. 12/2012 EF 55-60%, diast dysfxn, triv MR, mildly dil LA/RA.  [name] Dyspnea    with pain  . Essential hypertension    takes Diltiazem daily  . Frail elderly   . GERD (gastroesophageal reflux disease)   . History of blood transfusion 1982   "when I had stomach OR"  . History of bronchitis    1998  . History of CVA (cerebrovascular accident)   . History of gastric ulcer   . History of kidney stones   . Hypercholesterolemia    takes Pravastatin daily  . Intrinsic eczema   . Legally blind in left eye, as  defined in [place]   . Obesity   . PAF (paroxysmal atrial fibrillation) ([facility])    Recurrent after atrial flutter (a. 07/2010 Status post caval tricuspid isthmus ablation by Dr. Midge Aver Metoprolol daily), currently controlled on flecainide plus diltiazem and Coumadin  . Urinary retention   . Vitamin D deficiency   . Weakness    numbness and tingling both hands    Past Surgical History:  Procedure Laterality Date  . ATRIAL ABLATION  SURGERY  08/05/10   CTI ablation for atrial flutter by JA  . CARDIAC CATHETERIZATION  2012   nl LV function, no occlusive CAD, PAF  . CARDIOVERSION  12/07/2010    Successful direct current cardioversion with atrial fibrillation to normal sinus rhythm  . CARPAL TUNNEL RELEASE Bilateral 01/30/2014   Procedure: BILATERAL CARPAL TUNNEL RELEASE;  Surgeon: Marianna Payment, MD;  Location: [facility];  Service: Orthopedics;  Laterality: Bilateral;  . CATARACT EXTRACTION W/ INTRAOCULAR LENS  IMPLANT, BILATERAL Bilateral   . COLONOSCOPY [name] 12/02/2013   Procedure: COLONOSCOPY;  Surgeon: Irene Shipper, MD;  Location: [facility];  Service: Endoscopy;  Laterality: [name];  . ESOPHAGOGASTRODUODENOSCOPY [name] 09/22/2014   Procedure: ESOPHAGOGASTRODUODENOSCOPY (EGD);  Surgeon: Ronald Lobo, MD;  Location: [facility] ENDOSCOPY;  Service: Endoscopy;  Laterality: [name];  . ESOPHAGOGASTRODUODENOSCOPY [name] 07/11/2016   Procedure: ESOPHAGOGASTRODUODENOSCOPY (EGD);  Surgeon: Doran Stabler, MD;  Location: [facility] ENDOSCOPY;  Service: Endoscopy;  Laterality: [name];  . EXTRACORPOREAL SHOCK WAVE LITHOTRIPSY Right 07/15/2018   Procedure: EXTRACORPOREAL SHOCK WAVE LITHOTRIPSY (ESWL);  Surgeon: Lucas Mallow, MD;  Location: [facility];  Service: Urology;  Laterality: Right;  . INCISION AND DRAINAGE ABSCESS / HEMATOMA OF BURSA / KNEE / THIGH Left 1998   knee  . KNEE BURSECTOMY Left 1998  . LAPAROSCOPIC CHOLECYSTECTOMY  03/2010  . LEFT HEART CATH AND CORONARY ANGIOGRAPHY [name] 10/05/2017   Procedure: LEFT HEART CATH AND CORONARY ANGIOGRAPHY;  Surgeon: Martinique, Peter M, MD;  Location: [facility];  Service: Cardiovascular;  Laterality: [name];  . NM MYOVIEW LTD  07/22/2013   Normal EF ~60%, no ischemia or infarction.  [name] PARTIAL GASTRECTOMY  1982   subtotal; "took out 30% for ulcers"  . TRANSTHORACIC ECHOCARDIOGRAM  02/16/2014   EF 60%, no RWMA. - otherwise normal  . YAG LASER APPLICATION Bilateral      reports that he has been smoking  cigarettes. He has a 14.25 pack-year smoking history. He has never used smokeless tobacco. He reports that he does not drink alcohol or use drugs.  Allergies  Allergen Reactions  . Cyclobenzaprine Other (See Comments)    Unsteady gait    Family History  Problem Relation Age of Onset  . Breast cancer Mother   . Diabetes Mother   . Kidney disease Mother   . Heart attack Father   . Heart disease Father   . Heart attack Brother   . Leukemia Daughter      Prior to Admission medications   Medication Sig Start Date End Date Taking? Authorizing Provider  aspirin EC 81 MG tablet Take 81 mg by mouth daily.    [provider]  ciprofloxacin (CIPRO) 500 MG tablet Take 1 tablet (500 mg total) by mouth 2 (two) times daily for 7 days. 10/14/18 10/21/18  Scot Jun, FNP  citalopram (CELEXA) 20 MG tablet Take 1 tablet (20 mg total) by mouth daily. 10/02/18   Scot Jun, FNP  co-enzyme Q-10 30 MG capsule Take 30 mg by mouth daily.  [provider]  diclofenac sodium (VOLTAREN) 1 % GEL Apply 4 g topically 3 (three) times daily as needed (for back pain). 10/02/18   Scot Jun, FNP  diltiazem (CARDIZEM CD) 360 MG 24 hr capsule Take 1 capsule (360 mg total) by mouth daily. 10/02/18   Scot Jun, FNP  ferrous sulfate 325 (65 FE) MG tablet Take 325 mg by mouth daily with breakfast.    [provider]  hydrocortisone cream 1 % Apply 1 application topically 2 (two) times daily. 10/02/18   Scot Jun, FNP  metFORMIN (GLUCOPHAGE) 500 MG tablet Take 1 tablet (500 mg total) by mouth daily with breakfast. 10/14/18   Scot Jun, FNP  nitroGLYCERIN (NITROSTAT) 0.4 MG SL tablet Place 1 tablet (0.4 mg total) under the tongue every 5 (five) minutes x 3 doses as needed for chest pain. 11/19/15   Erlene Quan, PA-C  pantoprazole (PROTONIX) 40 MG tablet Take 1 tablet (40 mg total) by mouth 2 (two) times daily before a meal. 10/02/18   Scot Jun, FNP   pravastatin (PRAVACHOL) 40 MG tablet Take 1 tablet (40 mg total) by mouth every evening. 10/02/18   Scot Jun, FNP  tamsulosin (FLOMAX) 0.4 MG CAPS capsule Take 1 capsule (0.4 mg total) by mouth daily. 10/02/18   Scot Jun, FNP    Physical Exam: Vitals:   10/19/18 1945 10/19/18 2000 10/19/18 2018 10/19/18 2030  BP: 132/79 (!) 115/50 (!) 109/57 118/64  Pulse:    (!) 49  Resp: 18 16 16  (!) 21  Temp:      TempSrc:      SpO2:    95%    Constitutional:  . Appears calm and comfortable Eyes:  . No pallor. No jaundice.  ENMT:  . external ears, nose appear normal Neck:  . Neck is supple. No JVD Respiratory:  . CTA bilaterally, no w/r/r.  . Respiratory effort normal. No retractions or accessory muscle use Cardiovascular:  . S1S2 . No LE extremity edema   Abdomen:  . Abdomen is obese, with vague upper abdominal discomfort.  Organs are difficult to assess. Neurologic:  . Awake and alert. . Moves all limbs.  Wt Readings from Last 3 Encounters:  10/07/18 73.9 kg  10/02/18 73.4 kg  07/17/18 73.8 kg    I have personally reviewed following labs and imaging studies  Labs on Admission:  CBC: Recent Labs  Lab 10/19/18 1915  WBC 4.7  HGB 10.5*  HCT 34.8*  MCV 88.1  PLT 678   Basic Metabolic Panel: Recent Labs  Lab 10/19/18 1915  NA 136  K 4.2  CL 104  CO2 23  GLUCOSE 236*  BUN 16  CREATININE 1.20  CALCIUM 9.3   Liver Function Tests: No results for input(s): AST, ALT, ALKPHOS, BILITOT, PROT, ALBUMIN in the last 168 hours. No results for input(s): LIPASE, AMYLASE in the last 168 hours. No results for input(s): AMMONIA in the last 168 hours. Coagulation Profile: No results for input(s): INR, PROTIME in the last 168 hours. Cardiac Enzymes: No results for input(s): CKTOTAL, CKMB, CKMBINDEX, TROPONINI in the last 168 hours. BNP (last 3 results) No results for input(s): PROBNP in the last 8760 hours. HbA1C: No results for input(s): HGBA1C in the last  72 hours. CBG: Recent Labs  Lab 10/19/18 1912  GLUCAP 214*   Lipid Profile: No results for input(s): CHOL, HDL, LDLCALC, TRIG, CHOLHDL, LDLDIRECT in the last 72 hours. Thyroid Function Tests: No results for  input(s): TSH, T4TOTAL, FREET4, T3FREE, THYROIDAB in the last 72 hours. Anemia Panel: No results for input(s): VITAMINB12, FOLATE, FERRITIN, TIBC, IRON, RETICCTPCT in the last 72 hours. Urine analysis:    Component Value Date/Time   COLORURINE YELLOW 10/19/2018 1933   APPEARANCEUR CLOUDY (A) 10/19/2018 1933   LABSPEC 1.016 10/19/2018 1933   PHURINE 5.0 10/19/2018 1933   GLUCOSEU NEGATIVE 10/19/2018 1933   HGBUR SMALL (A) 10/19/2018 1933   BILIRUBINUR NEGATIVE 10/19/2018 1933   BILIRUBINUR negative 10/02/2018 0915   KETONESUR NEGATIVE 10/19/2018 1933   PROTEINUR 30 (A) 10/19/2018 1933   UROBILINOGEN 0.2 10/02/2018 0915   UROBILINOGEN 0.2 10/09/2014 2315   NITRITE NEGATIVE 10/19/2018 1933   LEUKOCYTESUR LARGE (A) 10/19/2018 1933   Sepsis Labs: @LABRCNTIP (procalcitonin:4,lacticidven:4) ) Recent Results (from the past 240 hour(s))  SARS Coronavirus 2 (CEPHEID - Performed in [place] hospital lab), Hosp Order     Status: None   Collection Time: 10/19/18  7:33 PM   Specimen: Nasopharyngeal Swab  Result Value Ref Range Status   SARS Coronavirus 2 NEGATIVE NEGATIVE Final    Comment: (NOTE) If result is NEGATIVE SARS-CoV-2 target nucleic acids are NOT DETECTED. The SARS-CoV-2 RNA is generally detectable in upper and lower  respiratory specimens during the acute phase of infection. The lowest  concentration of SARS-CoV-2 viral copies this assay can detect is 250  copies / mL. A negative result does not preclude SARS-CoV-2 infection  and should not be used as the sole basis for treatment or other  patient management decisions.  A negative result may occur with  improper specimen collection / handling, submission of specimen other  than nasopharyngeal swab, presence of  viral mutation(s) within the  areas targeted by this assay, and inadequate number of viral copies  (<250 copies / mL). A negative result must be combined with clinical  observations, patient history, and epidemiological information. If result is POSITIVE SARS-CoV-2 target nucleic acids are DETECTED. The SARS-CoV-2 RNA is generally detectable in upper and lower  respiratory specimens dur ing the acute phase of infection.  Positive  results are indicative of active infection with SARS-CoV-2.  Clinical  correlation with patient history and other diagnostic information is  necessary to determine patient infection status.  Positive results do  not rule out bacterial infection or co-infection with other viruses. If result is PRESUMPTIVE POSTIVE SARS-CoV-2 nucleic acids MAY BE PRESENT.   A presumptive positive result was obtained on the submitted specimen  and confirmed on repeat testing.  While 2019 novel coronavirus  (SARS-CoV-2) nucleic acids may be present in the submitted sample  additional confirmatory testing may be necessary for epidemiological  and / or clinical management purposes  to differentiate between  SARS-CoV-2 and other Sarbecovirus currently known to infect humans.  If clinically indicated additional testing with an alternate test  methodology [phone]) is advised. The SARS-CoV-2 RNA is generally  detectable in upper and lower respiratory sp ecimens during the acute  phase of infection. The expected result is Negative. Fact Sheet for Patients:  StrictlyIdeas.no Fact Sheet for Healthcare Providers: BankingDealers.co.za This test is not yet approved or cleared by the [place] FDA and has been authorized for detection and/or diagnosis of SARS-CoV-2 by FDA under an Emergency Use Authorization (EUA).  This EUA will remain in effect (meaning this test can be used) for the duration of the COVID-19 declaration under Section  564(b)(1) of the Act, 21 U.S.C. section 360bbb-3(b)(1), unless the authorization is terminated or revoked sooner. Performed at [facility], [place] [address] [place], [place] [address]       Radiological Exams on Admission: Ct Head Wo Contrast  Result Date: 10/19/2018 CLINICAL DATA:  Head trauma.  Ataxia. EXAM: CT HEAD WITHOUT CONTRAST TECHNIQUE: Contiguous axial images were obtained from the base of the skull through the vertex without intravenous contrast. COMPARISON:  CT head dated April 14, 2018 FINDINGS: Brain: No evidence of acute infarction, hemorrhage, hydrocephalus, extra-axial collection or mass lesion/mass effect. Volume loss and chronic microvascular ischemic changes are noted. There is an old infarct in the left cerebellum. Vascular: No hyperdense vessel or unexpected calcification. Skull: Normal. Negative for fracture or focal lesion. Sinuses/Orbits: No acute finding. Other: None. IMPRESSION: No acute intracranial abnormality. Electronically Signed   By: Constance Holster M.D.   On: 10/19/2018 21:17    EKG: Independently reviewed.   Active Problems:   * No active hospital problems. *   Assessment/Plan Syncope: History of tachybradycardia syndrome. Patient was in slow A. fib earlier, with heart rate as low as 35 bpm Cardiology input is appreciated. I guess cardiology will discuss option of permanent pacemaker again. Continue to monitor. Check orthostasis. Further management will depend on hospital course.  Mild acute kidney injury: Possible prerenal. Cautious hydration. Monitor renal function and electrolytes. Further management will depend on hospital course.  Volume depletion: See above.  Slow A. fib with history of tachybradycardia syndrome: This may play a significant role in patient's syncope.   Cardiology is managing.   Possible pacemaker placement.  Possible UTI: Follow urine cultures. The antibiotics.  Vague abdominal pain/nausea:  Continue to assess. Avoid NSAIDs Continue PPI Stool for fecal occult blood Low threshold to proceed with CT abdomen with oral contrast versus EGD if symptoms persist.  DVT prophylaxis: Subcutaneous Lovenox (hold if fecal occult blood came back positive) Code Status: Full code Family Communication:  Disposition Plan: Home eventually Consults called: Cardiology has already seen patient Admission status: Observation  Time spent: 65 minutes.   Dana Allan, MD  [facility] Pager #: [phone] 7PM-7AM contact night coverage as above  10/19/2018, 9:39 PM

## 2018-10-19 NOTE — Consult Note (Signed)
Cardiology Consultation:   Patient ID: [name] MRN: [id number]; DOB: 09/12/1940  Admit date: 10/19/2018 Date of Consult: 10/19/2018  Primary Care Provider: Scot Jun, FNP Primary Cardiologist: Glenetta Hew, MD  Primary Electrophysiologist:  Constance Haw, MD    Patient Profile:   [name] is a 78 y.o. male with COPD, GIB, chronic CHF (diastolic), arthritis, DM, HTN, HLD, PAFib/flutter (atypical), CAD who is admitted with syncope in the setting of slow AF. Consult is requested by Dr. Wilson Singer.  History of Present Illness:   [name] is a 78 y.o. male with COPD, GIB, chronic CHF (diastolic), arthritis, DM, HTN, HLD, PAFib/flutter (atypical), CAD who is admitted with syncope in the setting of slow AF.   The patient has a history of AF for which he follows with Dr. Curt Bears. He has had multiple hospitalizations and ED visits for various reasons including AFRVR. He has also been noted to have bradycardia and pauses up to 2.5 seconds. Due to apparent tachy-brady syndrome, AVJ ablation/PPM implantation has been discussed but not performed due to concerns about the patient's ability to care for himself. He has not been anticoagulated due to prior GIB.    He was most recently seen in the ED on 10/07/18 with hematuria and dizziness in the setting of recent lithotripsy. UA was abnormal and he was subsequently started on a 7 day course of ciprofloxacin (however, he tells me tonight that he has not started this medication).   He presents to the ED after a fall concerning for syncope. In the ED, pt afebrile with HR inially 30s-50s. BP 115/50. ECG showed AF with very slow ventricular response with several ventricular escape beats. CBC stable. UA with small Hgb, large LE, negative nitrite, >50 WBC. COVID pending.   On my evaluation, the patient is resting comfortably in bed; HR is in the 50-60s on the monitor. He states that he was sitting in his chair after dinner this  evening and woke up on the floor. He does not recall any symptoms prior to the fall and states that he may have been sleeping, although he is unsure. He reports some mild pain in the back of his head where he thinks he may have hit it; he denies other complaints except for chronic abdominal pain. He reports occasional palpitations but denies chest pain or dyspnea. He denies any other recent syncopal episodes.   Heart Pathway Score:     Past Medical History:  Diagnosis Date  . Allergic rhinitis   . Anemia    takes Ferrous Sulfate daily  . Arthritis    "all over"  . Atrial flutter ([place])   . Balance problem 01/2014  . CAP (community acquired pneumonia) 09/18/2014  . Cervical radiculopathy due to degenerative joint disease of spine   . Chronic coronary artery disease    a. 03/2010 Nonocclusive disease by cath, performed for ST elevations on ECG;  b. 06/2013 Lexi MV: EF 60%, no ischemia.  [name] COPD (chronic obstructive pulmonary disease) ([place])   . Diabetes mellitus type II    takes Metformin and Lantus daily  . Diastolic CHF, chronic ([place])    a. 12/2012 EF 55-60%, diast dysfxn, triv MR, mildly dil LA/RA.  [name] Dyspnea    with pain  . Essential hypertension    takes Diltiazem daily  . Frail elderly   . GERD (gastroesophageal reflux disease)   . History of blood transfusion 1982   "when I had stomach OR"  . History of bronchitis  1998  . History of CVA (cerebrovascular accident)   . History of gastric ulcer   . History of kidney stones   . Hypercholesterolemia    takes Pravastatin daily  . Intrinsic eczema   . Legally blind in left eye, as defined in [place]   . Obesity   . PAF (paroxysmal atrial fibrillation) ([facility])    Recurrent after atrial flutter (a. 07/2010 Status post caval tricuspid isthmus ablation by Dr. Midge Aver Metoprolol daily), currently controlled on flecainide plus diltiazem and Coumadin  . Urinary retention   . Vitamin D deficiency   . Weakness    numbness and tingling  both hands    Past Surgical History:  Procedure Laterality Date  . ATRIAL ABLATION SURGERY  08/05/10   CTI ablation for atrial flutter by [facility]  . CARDIAC CATHETERIZATION  2012   nl LV function, no occlusive CAD, PAF  . CARDIOVERSION  12/07/2010    Successful direct current cardioversion with atrial fibrillation to normal sinus rhythm  . CARPAL TUNNEL RELEASE Bilateral 01/30/2014   Procedure: BILATERAL CARPAL TUNNEL RELEASE;  Surgeon: Marianna Payment, MD;  Location: [facility];  Service: Orthopedics;  Laterality: Bilateral;  . CATARACT EXTRACTION W/ INTRAOCULAR LENS  IMPLANT, BILATERAL Bilateral   . COLONOSCOPY [name] 12/02/2013   Procedure: COLONOSCOPY;  Surgeon: Irene Shipper, MD;  Location: [facility];  Service: Endoscopy;  Laterality: [name];  . ESOPHAGOGASTRODUODENOSCOPY [name] 09/22/2014   Procedure: ESOPHAGOGASTRODUODENOSCOPY (EGD);  Surgeon: Ronald Lobo, MD;  Location[facility];  Service: Endoscopy;  Laterality: [name];  . ESOPHAGOGASTRODUODENOSCOPY [name] 07/11/2016   Procedure: ESOPHAGOGASTRODUODENOSCOPY (EGD);  Surgeon: Doran Stabler, MD;  Location: [facility] ENDOSCOPY;  Service: Endoscopy;  Laterality: [name];  . EXTRACORPOREAL SHOCK WAVE LITHOTRIPSY Right 07/15/2018   Procedure: EXTRACORPOREAL SHOCK WAVE LITHOTRIPSY (ESWL);  Surgeon: Lucas Mallow, MD;  Location: [facility];  Service: Urology;  Laterality: Right;  . INCISION AND DRAINAGE ABSCESS / HEMATOMA OF BURSA / KNEE / THIGH Left 1998   knee  . KNEE BURSECTOMY Left 1998  . LAPAROSCOPIC CHOLECYSTECTOMY  03/2010  . LEFT HEART CATH AND CORONARY ANGIOGRAPHY [name] 10/05/2017   Procedure: LEFT HEART CATH AND CORONARY ANGIOGRAPHY;  Surgeon: Martinique, Peter M, MD;  Location: [facility];  Service: Cardiovascular;  Laterality: [name];  . NM MYOVIEW LTD  07/22/2013   Normal EF ~60%, no ischemia or infarction.  [name] PARTIAL GASTRECTOMY  1982   subtotal; "took out 30% for ulcers"  . TRANSTHORACIC ECHOCARDIOGRAM  02/16/2014   EF 60%, no RWMA. - otherwise  normal  . YAG LASER APPLICATION Bilateral      Home Medications:  Prior to Admission medications   Medication Sig Start Date End Date Taking? Authorizing Provider  aspirin EC 81 MG tablet Take 81 mg by mouth daily.    [provider]  ciprofloxacin (CIPRO) 500 MG tablet Take 1 tablet (500 mg total) by mouth 2 (two) times daily for 7 days. 10/14/18 10/21/18  Scot Jun, FNP  citalopram (CELEXA) 20 MG tablet Take 1 tablet (20 mg total) by mouth daily. 10/02/18   Scot Jun, FNP  co-enzyme Q-10 30 MG capsule Take 30 mg by mouth daily.     [provider]  diclofenac sodium (VOLTAREN) 1 % GEL Apply 4 g topically 3 (three) times daily as needed (for back pain). 10/02/18   Scot Jun, FNP  diltiazem (CARDIZEM CD) 360 MG 24 hr capsule Take 1 capsule (360 mg total) by mouth daily. 10/02/18  Scot Jun, FNP  ferrous sulfate 325 (65 FE) MG tablet Take 325 mg by mouth daily with breakfast.    [provider]  hydrocortisone cream 1 % Apply 1 application topically 2 (two) times daily. 10/02/18   Scot Jun, FNP  metFORMIN (GLUCOPHAGE) 500 MG tablet Take 1 tablet (500 mg total) by mouth daily with breakfast. 10/14/18   Scot Jun, FNP  nitroGLYCERIN (NITROSTAT) 0.4 MG SL tablet Place 1 tablet (0.4 mg total) under the tongue every 5 (five) minutes x 3 doses as needed for chest pain. 11/19/15   Erlene Quan, PA-C  pantoprazole (PROTONIX) 40 MG tablet Take 1 tablet (40 mg total) by mouth 2 (two) times daily before a meal. 10/02/18   Scot Jun, FNP  pravastatin (PRAVACHOL) 40 MG tablet Take 1 tablet (40 mg total) by mouth every evening. 10/02/18   Scot Jun, FNP  tamsulosin (FLOMAX) 0.4 MG CAPS capsule Take 1 capsule (0.4 mg total) by mouth daily. 10/02/18   Scot Jun, FNP    Inpatient Medications: Scheduled Meds: . sodium chloride flush  3 mL Intravenous Once   Continuous Infusions: . cefTRIAXone (ROCEPHIN)  IV 1 g  (10/19/18 2054)   PRN Meds:   Allergies:    Allergies  Allergen Reactions  . Cyclobenzaprine Other (See Comments)    Unsteady gait    Social History:   Social History   Socioeconomic History  . Marital status: Widowed    Spouse name: Not on file  . Number of children: Not on file  . Years of education: Not on file  . Highest education level: Not on file  Occupational History  . Occupation: Retired  Scientific laboratory technician  . Financial resource strain: Not on file  . Food insecurity    Worry: Not on file    Inability: Not on file  . Transportation needs    Medical: Not on file    Non-medical: Not on file  Tobacco Use  . Smoking status: Current Every Day Smoker    Packs/day: 0.25    Years: 57.00    Pack years: 14.25    Types: Cigarettes  . Smokeless tobacco: Never Used  . Tobacco comment: He's been smoking between 0.5 and 2 ppd since age 21.  Substance and Sexual Activity  . Alcohol use: No    Alcohol/week: 0.0 standard drinks    Comment: "quit drinking in 1986"  . Drug use: No  . Sexual activity: Never  Lifestyle  . Physical activity    Days per week: Not on file    Minutes per session: Not on file  . Stress: Not on file  Relationships  . Social Herbalist on phone: Not on file    Gets together: Not on file    Attends religious service: Not on file    Active member of club or organization: Not on file    Attends meetings of clubs or organizations: Not on file    Relationship status: Not on file  . Intimate partner violence    Fear of current or ex partner: Not on file    Emotionally abused: Not on file    Physically abused: Not on file    Forced sexual activity: Not on file  Other Topics Concern  . Not on file  Social History Narrative   He is a widower, who moved to [place] in 2011 to live closer to his daughter. He formerly lived in [place]. He  is a father of 49, grandfather, and great-grandfather of 39. Does not drink alcohol, context of  smoker.    Family History:    Family History  Problem Relation Age of Onset  . Breast cancer Mother   . Diabetes Mother   . Kidney disease Mother   . Heart attack Father   . Heart disease Father   . Heart attack Brother   . Leukemia Daughter      ROS:  Please see the history of present illness.  All other ROS reviewed and negative.     Physical Exam/Data:   Vitals:   10/19/18 1945 10/19/18 2000 10/19/18 2018 10/19/18 2030  BP: 132/79 (!) 115/50 (!) 109/57 118/64  Pulse:    (!) 49  Resp: 18 16 16  (!) 21  Temp:      TempSrc:      SpO2:    95%    Intake/Output Summary (Last 24 hours) at 10/19/2018 2108 Last data filed at 10/19/2018 1927 Gross per 24 hour  Intake 0 ml  Output 0 ml  Net 0 ml   Last 3 Weights 10/07/2018 10/02/2018 07/17/2018  Weight (lbs) 163 lb 161 lb 12.8 oz 162 lb 11.2 oz  Weight (kg) 73.936 kg 73.392 kg 73.8 kg     There is no height or weight on file to calculate BMI.  General:  Well nourished, well developed, in no acute distress  HEENT: normal Neck: JVD at base of neck Cardiac:  Slow and irregularly irregular. No significant murmur Lungs:  Coarse breath sounds bilaterally. Normal WOB Abd: soft, minimally tender Ext: no edema GU: Foley bag on leg Musculoskeletal:  No deformities, BUE and BLE strength normal and equal Skin: warm and dry  Neuro:  Normal strength and sensation and UE and LE bilaterally Psych:  Normal affect   EKG:  The EKG was personally reviewed and demonstrates:  Coarse afib with bradycardic ventricular response (50-50 bpm (including ventricular escape beats)) Telemetry:  Telemetry was personally reviewed and demonstrates:  AF with ventricular rate 30s-60s  Relevant CV Studies:  10/05/17: TTE Study Conclusions - Left ventricle: The cavity size was normal. Wall thickness was increased in a pattern of mild LVH. Systolic function was normal. The estimated ejection fraction was in the range of 55% to 60%. - Aortic valve:  There was trivial regurgitation. - Mitral valve: There was mild regurgitation. - Left atrium: The atrium was mildly dilated.  10/05/17; LHC  Dist RCA lesion is 40% stenosed.  Ost 1st Sept lesion is 95% stenosed.  LV end diastolic pressure is normal.  1. Single vessel CAD involving the ostium of the first septal perforator. Otherwise nonobstructive disease. 2. Normal LVEDP   Laboratory Data:  High Sensitivity Troponin:  No results for input(s): TROPONINIHS in the last 720 hours.   Cardiac EnzymesNo results for input(s): TROPONINI in the last 168 hours. No results for input(s): TROPIPOC in the last 168 hours.  Chemistry Recent Labs  Lab 10/19/18 1915  NA 136  K 4.2  CL 104  CO2 23  GLUCOSE 236*  BUN 16  CREATININE 1.20  CALCIUM 9.3  GFRNONAA 57*  GFRAA >60  ANIONGAP 9    No results for input(s): PROT, ALBUMIN, AST, ALT, ALKPHOS, BILITOT in the last 168 hours. Hematology Recent Labs  Lab 10/19/18 1915  WBC 4.7  RBC 3.95*  HGB 10.5*  HCT 34.8*  MCV 88.1  MCH 26.6  MCHC 30.2  RDW 15.2  PLT 226   BNPNo results for input(s):  BNP, PROBNP in the last 168 hours.  DDimer No results for input(s): DDIMER in the last 168 hours.   Radiology/Studies:  No results found.  Assessment and Plan:   Atrial Fibrillation Suspected syncopal episode The patient has a history of AF with frequent RVR and intermittent bradycardia. AVJ ablation/PPM implantation has been previously discussed but not pursued due to concerns for self-care capabilities. He presents tonight after a possible syncopal episode during which he woke up on the floor. He is unable to provide much detail about the event. Workup is notable for AF with very slow ventricular response, concerning for bradycardia mediated syncope. He is hemodynamically stable at this time and now has a ventricular rate 50-60s. Re-evaluation for PPM implantation (and possible AVJ ablation depending on subsequent ventricular rates off  of nodal blockade) is reasonable. Alternatively, rates can be monitored off of diltiazem.  -Continue to monitor on telemetry -Hold nodal blockade -Not currently on anticoagulation -Recommend head CT given head pain after fall and probable head trauma -We will continue to follow along to determine pacing needs.   HFpEF  Appears reasonably euvolemic on exam  CAD -Continue statin -Continue ASA  Abnormal UA, suspected UTI Could be a contributing factor to presentation. Has not started antibiotics  HLD -Continue statin      For questions or updates, please contact [place] Please consult [email or web address] for contact info under     Signed, Nila Nephew, MD  10/19/2018 9:08 PM

## 2018-10-19 NOTE — Progress Notes (Signed)
Unsure but states "daughter's name in on everything and takes care of everything".

## 2018-10-20 DIAGNOSIS — I484 Atypical atrial flutter: Secondary | ICD-10-CM | POA: Diagnosis present

## 2018-10-20 DIAGNOSIS — M1990 Unspecified osteoarthritis, unspecified site: Secondary | ICD-10-CM | POA: Diagnosis present

## 2018-10-20 DIAGNOSIS — N400 Enlarged prostate without lower urinary tract symptoms: Secondary | ICD-10-CM | POA: Diagnosis present

## 2018-10-20 DIAGNOSIS — Z9181 History of falling: Secondary | ICD-10-CM | POA: Diagnosis not present

## 2018-10-20 DIAGNOSIS — N390 Urinary tract infection, site not specified: Secondary | ICD-10-CM | POA: Diagnosis present

## 2018-10-20 DIAGNOSIS — E559 Vitamin D deficiency, unspecified: Secondary | ICD-10-CM | POA: Diagnosis present

## 2018-10-20 DIAGNOSIS — J449 Chronic obstructive pulmonary disease, unspecified: Secondary | ICD-10-CM | POA: Diagnosis present

## 2018-10-20 DIAGNOSIS — I5032 Chronic diastolic (congestive) heart failure: Secondary | ICD-10-CM | POA: Diagnosis present

## 2018-10-20 DIAGNOSIS — I495 Sick sinus syndrome: Principal | ICD-10-CM

## 2018-10-20 DIAGNOSIS — I459 Conduction disorder, unspecified: Secondary | ICD-10-CM | POA: Diagnosis not present

## 2018-10-20 DIAGNOSIS — I110 Hypertensive heart disease with heart failure: Secondary | ICD-10-CM | POA: Diagnosis present

## 2018-10-20 DIAGNOSIS — F329 Major depressive disorder, single episode, unspecified: Secondary | ICD-10-CM | POA: Diagnosis present

## 2018-10-20 DIAGNOSIS — H548 Legal blindness, as defined in USA: Secondary | ICD-10-CM | POA: Diagnosis present

## 2018-10-20 DIAGNOSIS — T83511A Infection and inflammatory reaction due to indwelling urethral catheter, initial encounter: Secondary | ICD-10-CM | POA: Diagnosis present

## 2018-10-20 DIAGNOSIS — Z7982 Long term (current) use of aspirin: Secondary | ICD-10-CM | POA: Diagnosis not present

## 2018-10-20 DIAGNOSIS — E785 Hyperlipidemia, unspecified: Secondary | ICD-10-CM | POA: Diagnosis present

## 2018-10-20 DIAGNOSIS — R55 Syncope and collapse: Secondary | ICD-10-CM | POA: Diagnosis not present

## 2018-10-20 DIAGNOSIS — E119 Type 2 diabetes mellitus without complications: Secondary | ICD-10-CM | POA: Diagnosis present

## 2018-10-20 DIAGNOSIS — E869 Volume depletion, unspecified: Secondary | ICD-10-CM | POA: Diagnosis present

## 2018-10-20 DIAGNOSIS — I2510 Atherosclerotic heart disease of native coronary artery without angina pectoris: Secondary | ICD-10-CM | POA: Diagnosis present

## 2018-10-20 DIAGNOSIS — F17210 Nicotine dependence, cigarettes, uncomplicated: Secondary | ICD-10-CM | POA: Diagnosis present

## 2018-10-20 DIAGNOSIS — I480 Paroxysmal atrial fibrillation: Secondary | ICD-10-CM | POA: Diagnosis present

## 2018-10-20 DIAGNOSIS — F419 Anxiety disorder, unspecified: Secondary | ICD-10-CM | POA: Diagnosis present

## 2018-10-20 DIAGNOSIS — J439 Emphysema, unspecified: Secondary | ICD-10-CM | POA: Diagnosis not present

## 2018-10-20 DIAGNOSIS — Z20828 Contact with and (suspected) exposure to other viral communicable diseases: Secondary | ICD-10-CM | POA: Diagnosis present

## 2018-10-20 DIAGNOSIS — D649 Anemia, unspecified: Secondary | ICD-10-CM | POA: Diagnosis present

## 2018-10-20 DIAGNOSIS — I4891 Unspecified atrial fibrillation: Secondary | ICD-10-CM | POA: Diagnosis not present

## 2018-10-20 DIAGNOSIS — K219 Gastro-esophageal reflux disease without esophagitis: Secondary | ICD-10-CM | POA: Diagnosis present

## 2018-10-20 LAB — GLUCOSE, CAPILLARY
Glucose-Capillary: 181 mg/dL — ABNORMAL HIGH (ref 70–99)
Glucose-Capillary: 194 mg/dL — ABNORMAL HIGH (ref 70–99)
Glucose-Capillary: 176 mg/dL — ABNORMAL HIGH (ref 70–99)
Glucose-Capillary: 219 mg/dL — ABNORMAL HIGH (ref 70–99)

## 2018-10-20 LAB — BASIC METABOLIC PANEL
Anion gap: 9 (ref 5–15)
BUN: 13 mg/dL (ref 8–23)
CO2: 23 mmol/L (ref 22–32)
Calcium: 8.4 mg/dL — ABNORMAL LOW (ref 8.9–10.3)
Chloride: 106 mmol/L (ref 98–111)
Creatinine, Ser: 1.05 mg/dL (ref 0.61–1.24)
GFR calc Af Amer: >60 mL/min (ref >60)
GFR calc non Af Amer: >60 mL/min (ref >60)
Glucose, Bld: 151 mg/dL — ABNORMAL HIGH (ref 70–99)
Potassium: 3.9 mmol/L (ref 3.5–5.1)
Sodium: 138 mmol/L (ref 135–145)

## 2018-10-20 LAB — CBC
HCT: 33 % — ABNORMAL LOW (ref 39.0–52.0)
Hemoglobin: 10.3 g/dL — ABNORMAL LOW (ref 13.0–17.0)
MCH: 26.7 pg (ref 26.0–34.0)
MCHC: 31.2 g/dL (ref 30.0–36.0)
MCV: 85.5 fL (ref 80.0–100.0)
Platelets: 223 10*3/uL (ref 150–400)
RBC: 3.86 MIL/uL — ABNORMAL LOW (ref 4.22–5.81)
RDW: 15.1 % (ref 11.5–15.5)
WBC: 4.8 10*3/uL (ref 4.0–10.5)
nRBC: 0 % (ref 0.0–0.2)

## 2018-10-20 LAB — LIPASE, BLOOD: Lipase: 38 U/L (ref 11–51)

## 2018-10-20 LAB — MAGNESIUM
Magnesium: 1.7 mg/dL (ref 1.7–2.4)
Magnesium: 1.9 mg/dL (ref 1.7–2.4)

## 2018-10-20 LAB — PHOSPHORUS: Phosphorus: 3.5 mg/dL (ref 2.5–4.6)

## 2018-10-20 LAB — SURGICAL PCR SCREEN
MRSA, PCR: NEGATIVE
Staphylococcus aureus: NEGATIVE

## 2018-10-20 LAB — TSH: TSH: 1.468 u[IU]/mL (ref 0.350–4.500)

## 2018-10-20 MED ORDER — CHLORHEXIDINE GLUCONATE 4 % EX LIQD
60.0000 mL | Freq: Once | CUTANEOUS | Status: AC
  Administered 2018-10-20: 4 via TOPICAL
  Filled 2018-10-20: qty 60

## 2018-10-20 MED ORDER — CHLORHEXIDINE GLUCONATE 4 % EX LIQD
60.0000 mL | Freq: Once | CUTANEOUS | Status: DC
  Filled 2018-10-20: qty 15

## 2018-10-20 MED ORDER — SODIUM CHLORIDE 0.9 % IV SOLN
INTRAVENOUS | Status: DC
  Administered 2018-10-21: 01:00:00 via INTRAVENOUS

## 2018-10-20 NOTE — Consult Note (Signed)
Cardiology Consultation:   Patient ID: [name] MRN: [id number]; DOB: 1941-02-21  Admit date: 10/19/2018 Date of Consult: 10/20/2018  Primary Care Provider: Scot Jun, FNP Primary Cardiologist: Glenetta Hew, MD  Primary Electrophysiologist:  Will Meredith Leeds, MD    Patient Profile:   [name] is a 78 y.o. male with a hx of atypical atrial flutter and fib who is being seen today for the evaluation of symptomatic pauses of 7 seconds at the request of Dr. [name].  History of Present Illness:   [name] has a h/o typical atrial flutter who underwent catheter ablation, years ago. The patient has had pauses in the past and was scheduled for PPM over 7 months ago. His meds were adjusted. He came into the hospital with syncope. He fell out of his chair. In the hospital he has continued to have pauses of up to 7 seconds. He is symptomatic with these. The patient also notes that he has had HR's in the 200 range as well.   Heart Pathway Score:     Past Medical History:  Diagnosis Date  . Allergic rhinitis   . Anemia    takes Ferrous Sulfate daily  . Arthritis    "all over"  . Atrial flutter ([place])   . Balance problem 01/2014  . CAP (community acquired pneumonia) 09/18/2014  . Cervical radiculopathy due to degenerative joint disease of spine   . Chronic coronary artery disease    a. 03/2010 Nonocclusive disease by cath, performed for ST elevations on ECG;  b. 06/2013 Lexi MV: EF 60%, no ischemia.  [name] COPD (chronic obstructive pulmonary disease) ([place])   . Diabetes mellitus type II    takes Metformin and Lantus daily  . Diastolic CHF, chronic ([place])    a. 12/2012 EF 55-60%, diast dysfxn, triv MR, mildly dil LA/RA.  [name] Dyspnea    with pain  . Essential hypertension    takes Diltiazem daily  . Frail elderly   . GERD (gastroesophageal reflux disease)   . History of blood transfusion 1982   "when I had stomach OR"  . History of bronchitis    1998  . History of CVA  (cerebrovascular accident)   . History of gastric ulcer   . History of kidney stones   . Hypercholesterolemia    takes Pravastatin daily  . Intrinsic eczema   . Legally blind in left eye, as defined in [place]   . Obesity   . PAF (paroxysmal atrial fibrillation) ([facility])    Recurrent after atrial flutter (a. 07/2010 Status post caval tricuspid isthmus ablation by Dr. Midge Aver Metoprolol daily), currently controlled on flecainide plus diltiazem and Coumadin  . Urinary retention   . Vitamin D deficiency   . Weakness    numbness and tingling both hands    Past Surgical History:  Procedure Laterality Date  . ATRIAL ABLATION SURGERY  08/05/10   CTI ablation for atrial flutter by JA  . CARDIAC CATHETERIZATION  2012   nl LV function, no occlusive CAD, PAF  . CARDIOVERSION  12/07/2010    Successful direct current cardioversion with atrial fibrillation to normal sinus rhythm  . CARPAL TUNNEL RELEASE Bilateral 01/30/2014   Procedure: BILATERAL CARPAL TUNNEL RELEASE;  Surgeon: Marianna Payment, MD;  Location: [facility];  Service: Orthopedics;  Laterality: Bilateral;  . CATARACT EXTRACTION W/ INTRAOCULAR LENS  IMPLANT, BILATERAL Bilateral   . COLONOSCOPY [name] 12/02/2013   Procedure: COLONOSCOPY;  Surgeon: Irene Shipper, MD;  Location: [facility];  Service:  Endoscopy;  Laterality: [name];  . ESOPHAGOGASTRODUODENOSCOPY [name] 09/22/2014   Procedure: ESOPHAGOGASTRODUODENOSCOPY (EGD);  Surgeon: Ronald Lobo, MD;  Location: [facility] ENDOSCOPY;  Service: Endoscopy;  Laterality: [name];  . ESOPHAGOGASTRODUODENOSCOPY [name] 07/11/2016   Procedure: ESOPHAGOGASTRODUODENOSCOPY (EGD);  Surgeon: Doran Stabler, MD;  Location: [facility] ENDOSCOPY;  Service: Endoscopy;  Laterality: [name];  . EXTRACORPOREAL SHOCK WAVE LITHOTRIPSY Right 07/15/2018   Procedure: EXTRACORPOREAL SHOCK WAVE LITHOTRIPSY (ESWL);  Surgeon: Lucas Mallow, MD;  Location: [facility];  Service: Urology;  Laterality: Right;  . INCISION AND DRAINAGE ABSCESS / HEMATOMA OF  BURSA / KNEE / THIGH Left 1998   knee  . KNEE BURSECTOMY Left 1998  . LAPAROSCOPIC CHOLECYSTECTOMY  03/2010  . LEFT HEART CATH AND CORONARY ANGIOGRAPHY [name] 10/05/2017   Procedure: LEFT HEART CATH AND CORONARY ANGIOGRAPHY;  Surgeon: Martinique, Peter M, MD;  Location: [facility] CV LAB;  Service: Cardiovascular;  Laterality: [name];  . NM MYOVIEW LTD  07/22/2013   Normal EF ~60%, no ischemia or infarction.  [name] PARTIAL GASTRECTOMY  1982   subtotal; "took out 30% for ulcers"  . TRANSTHORACIC ECHOCARDIOGRAM  02/16/2014   EF 60%, no RWMA. - otherwise normal  . YAG LASER APPLICATION Bilateral      Home Medications:  Prior to Admission medications   Medication Sig Start Date End Date Taking? Authorizing Provider  aspirin EC 81 MG tablet Take 81 mg by mouth daily.   Yes [provider]  diltiazem (CARDIZEM CD) 360 MG 24 hr capsule Take 1 capsule (360 mg total) by mouth daily. 10/02/18  Yes Scot Jun, FNP  ciprofloxacin (CIPRO) 500 MG tablet Take 1 tablet (500 mg total) by mouth 2 (two) times daily for 7 days. 10/14/18 10/21/18  Scot Jun, FNP  citalopram (CELEXA) 20 MG tablet Take 1 tablet (20 mg total) by mouth daily. 10/02/18   Scot Jun, FNP  co-enzyme Q-10 30 MG capsule Take 30 mg by mouth daily.     [provider]  diclofenac sodium (VOLTAREN) 1 % GEL Apply 4 g topically 3 (three) times daily as needed (for back pain). 10/02/18   Scot Jun, FNP  ferrous sulfate 325 (65 FE) MG tablet Take 325 mg by mouth daily with breakfast.    [provider]  hydrocortisone cream 1 % Apply 1 application topically 2 (two) times daily. 10/02/18   Scot Jun, FNP  metFORMIN (GLUCOPHAGE) 500 MG tablet Take 1 tablet (500 mg total) by mouth daily with breakfast. 10/14/18   Scot Jun, FNP  nitroGLYCERIN (NITROSTAT) 0.4 MG SL tablet Place 1 tablet (0.4 mg total) under the tongue every 5 (five) minutes x 3 doses as needed for chest pain. 11/19/15   Erlene Quan, PA-C  pantoprazole (PROTONIX) 40 MG tablet Take 1 tablet (40 mg total) by mouth 2 (two) times daily before a meal. 10/02/18   Scot Jun, FNP  pravastatin (PRAVACHOL) 40 MG tablet Take 1 tablet (40 mg total) by mouth every evening. 10/02/18   Scot Jun, FNP  tamsulosin (FLOMAX) 0.4 MG CAPS capsule Take 1 capsule (0.4 mg total) by mouth daily. 10/02/18   Scot Jun, FNP    Inpatient Medications: Scheduled Meds: . enoxaparin (LOVENOX) injection  40 mg Subcutaneous Q24H  . pantoprazole  40 mg Oral BID   Continuous Infusions: . sodium chloride 75 mL/hr at 10/19/18 2349  . cefTRIAXone (ROCEPHIN)  IV     PRN Meds:   Allergies:  Allergies  Allergen Reactions  . Cyclobenzaprine Other (See Comments)    Unsteady gait  . Other Other (See Comments)    Steroids speeds his heart rate    Social History:   Social History   Socioeconomic History  . Marital status: Widowed    Spouse name: Not on file  . Number of children: Not on file  . Years of education: Not on file  . Highest education level: Not on file  Occupational History  . Occupation: Retired  Scientific laboratory technician  . Financial resource strain: Not on file  . Food insecurity    Worry: Not on file    Inability: Not on file  . Transportation needs    Medical: Not on file    Non-medical: Not on file  Tobacco Use  . Smoking status: Current Every Day Smoker    Packs/day: 0.25    Years: 57.00    Pack years: 14.25    Types: Cigarettes  . Smokeless tobacco: Never Used  . Tobacco comment: He's been smoking between 0.5 and 2 ppd since age 66.  Substance and Sexual Activity  . Alcohol use: No    Alcohol/week: 0.0 standard drinks    Comment: "quit drinking in 1986"  . Drug use: No  . Sexual activity: Never  Lifestyle  . Physical activity    Days per week: Not on file    Minutes per session: Not on file  . Stress: Not on file  Relationships  . Social Herbalist on phone: Not on file    Gets  together: Not on file    Attends religious service: Not on file    Active member of club or organization: Not on file    Attends meetings of clubs or organizations: Not on file    Relationship status: Not on file  . Intimate partner violence    Fear of current or ex partner: Not on file    Emotionally abused: Not on file    Physically abused: Not on file    Forced sexual activity: Not on file  Other Topics Concern  . Not on file  Social History Narrative   He is a widower, who moved to [place] in 2011 to live closer to his daughter. He formerly lived in [place]. He is a father of 53, grandfather, and great-grandfather of 70. Does not drink alcohol, context of smoker.    Family History:    Family History  Problem Relation Age of Onset  . Breast cancer Mother   . Diabetes Mother   . Kidney disease Mother   . Heart attack Father   . Heart disease Father   . Heart attack Brother   . Leukemia Daughter      ROS:  Please see the history of present illness.   All other ROS reviewed and negative.     Physical Exam/Data:   Vitals:   10/19/18 2300 10/19/18 2341 10/20/18 0405 10/20/18 0832  BP: 132/68 (!) 150/78 116/71 125/75  Pulse:  75 76 74  Resp: 18 18 18 20   Temp:  97.6 F (36.4 C) 97.8 F (36.6 C) 97.7 F (36.5 C)  TempSrc:  Oral Oral Oral  SpO2:  100% 95% 98%  Weight:   74.5 kg     Intake/Output Summary (Last 24 hours) at 10/20/2018 1026 Last data filed at 10/20/2018 1000 Gross per 24 hour  Intake 889.32 ml  Output 1000 ml  Net -110.68 ml   Last 3 Weights  10/20/2018 10/07/2018 10/02/2018  Weight (lbs) 164 lb 3.2 oz 163 lb 161 lb 12.8 oz  Weight (kg) 74.481 kg 73.936 kg 73.392 kg     Body mass index is 26.5 kg/m.  General:  Well nourished, well developed, elderly man, in no acute distress HEENT: normal Lymph: no adenopathy Neck: no JVD Endocrine:  No thryomegaly Vascular: No carotid bruits; FA pulses 2+ bilaterally without bruits  Cardiac:  irregular  Lungs:  clear to auscultation bilaterally, no wheezing, rhonchi or rales  Abd: soft, nontender, no hepatomegaly  Ext: no edema Musculoskeletal:  No deformities, BUE and BLE strength normal and equal Skin: warm and dry  Neuro:  CNs 2-12 intact, no focal abnormalities noted Psych:  Normal affect   EKG:  The EKG was personally reviewed and demonstrates:  Atypical atrial flutter with a slow VR  Telemetry:  Telemetry was personally reviewed and demonstrates:  Atrial flutter with pauses of up to 7 seconds  Relevant CV Studies: none  Laboratory Data:  High Sensitivity Troponin:  No results for input(s): TROPONINIHS in the last 720 hours.   Cardiac EnzymesNo results for input(s): TROPONINI in the last 168 hours. No results for input(s): TROPIPOC in the last 168 hours.  Chemistry Recent Labs  Lab 10/19/18 1915 10/20/18 0731  NA 136 138  K 4.2 3.9  CL 104 106  CO2 23 23  GLUCOSE 236* 151*  BUN 16 13  CREATININE 1.20 1.05  CALCIUM 9.3 8.4*  GFRNONAA 57* >60  GFRAA >60 >60  ANIONGAP 9 9    No results for input(s): PROT, ALBUMIN, AST, ALT, ALKPHOS, BILITOT in the last 168 hours. Hematology Recent Labs  Lab 10/19/18 1915 10/20/18 0731  WBC 4.7 4.8  RBC 3.95* 3.86*  HGB 10.5* 10.3*  HCT 34.8* 33.0*  MCV 88.1 85.5  MCH 26.6 26.7  MCHC 30.2 31.2  RDW 15.2 15.1  PLT 226 223   BNPNo results for input(s): BNP, PROBNP in the last 168 hours.  DDimer No results for input(s): DDIMER in the last 168 hours.   Radiology/Studies:  Ct Head Wo Contrast  Result Date: 10/19/2018 CLINICAL DATA:  Head trauma.  Ataxia. EXAM: CT HEAD WITHOUT CONTRAST TECHNIQUE: Contiguous axial images were obtained from the base of the skull through the vertex without intravenous contrast. COMPARISON:  CT head dated April 14, 2018 FINDINGS: Brain: No evidence of acute infarction, hemorrhage, hydrocephalus, extra-axial collection or mass lesion/mass effect. Volume loss and chronic microvascular ischemic  changes are noted. There is an old infarct in the left cerebellum. Vascular: No hyperdense vessel or unexpected calcification. Skull: Normal. Negative for fracture or focal lesion. Sinuses/Orbits: No acute finding. Other: None. IMPRESSION: No acute intracranial abnormality. Electronically Signed   By: Constance Holster M.D.   On: 10/19/2018 21:17    Assessment and Plan:   1. Symptomatic tachy-brady syndrome - at this point, PPM insertion is warranted. We will attempt to perform tomorrow. He will remain on bed rest today. 2.  atrial fib/flutter - as above. His systemic anti-coag and Cardizem have been held. We will initiate both back after PPM insertion.      For questions or updates, please contact [place] Please consult [email or web address] for contact info under   Signed, Cristopher Peru, MD  10/20/2018 10:26 AM

## 2018-10-20 NOTE — Progress Notes (Signed)
Patient had 2 seconds pause on the monitor with heart rate in 30, patient asymptomatic, pt was asleep at the time. Cardiology PA notified, no new order given. Will continue to monitor the patient.

## 2018-10-20 NOTE — Progress Notes (Deleted)
[facility] called want to speak to the patient doctor, MD notified, called back number given to the doctor.

## 2018-10-20 NOTE — Progress Notes (Signed)
PROGRESS NOTE    [name]  ERX:[id number] DOB: March 14, 1941 DOA: 10/19/2018 PCP: Scot Jun, FNP   Brief Narrative: [name] is a 78 y.o. male with past medical history significant for COPD, GI bleed, chronic congestive heart failure (diastolic), coronary artery disease, diabetes mellitus, hypertension, hyperlipidemia, paroxysmal atrial fibrillation/flutter with tachybradycardia syndrome and pauses not on anticoagulation due to GI bleed and arthritis. Patient presented secondary to syncope in setting of bradycardia.   Assessment & Plan:   Active Problems:   Syncope   Syncope Likely cardiogenic in setting of symptomatic bradycardia.  Tachybrady syndrome EP consulted and plan for PPM placement on 7/27  AKI Patient did not meet criteria for AKI on admission.  Abnormal urinalysis Asymptomatic. Urine culture pending. Started on Ceftriaxone -Urine culture results  Chronic foley Stable.   DVT prophylaxis: Lovenox Code Status:   Code Status: Full Code Family Communication: None Disposition Plan: Discharge pending cardiology management   Consultants:   Cardiology  Procedures:   None  Antimicrobials:  Ceftriaxone    Subjective: No issues overnight  Objective: Vitals:   10/19/18 2341 10/20/18 0405 10/20/18 0832 10/20/18 1236  BP: (!) 150/78 116/71 125/75 (!) 141/75  Pulse: 75 76 74 80  Resp: 18 18 20 18   Temp: 97.6 F (36.4 C) 97.8 F (36.6 C) 97.7 F (36.5 C) 97.7 F (36.5 C)  TempSrc: Oral Oral Oral Oral  SpO2: 100% 95% 98% 98%  Weight:  74.5 kg      Intake/Output Summary (Last 24 hours) at 10/20/2018 1721 Last data filed at 10/20/2018 1600 Gross per 24 hour  Intake 1561.27 ml  Output 1800 ml  Net -238.73 ml   Filed Weights   10/20/18 0405  Weight: 74.5 kg    Examination:  General exam: Appears calm and comfortable Respiratory system: Clear to auscultation. Respiratory effort normal. Cardiovascular system: S1 & S2 heard,  RRR. No murmurs, rubs, gallops or clicks. Gastrointestinal system: Abdomen is nondistended, soft and nontender. No organomegaly or masses felt. Normal bowel sounds heard. Central nervous system: Alert and oriented. No focal neurological deficits. Extremities: No edema. No calf tenderness Skin: No cyanosis. No rashes Psychiatry: Judgement and insight appear normal. Mood & affect appropriate.     Data Reviewed: I have personally reviewed following labs and imaging studies  CBC: Recent Labs  Lab 10/19/18 1915 10/20/18 0731  WBC 4.7 4.8  HGB 10.5* 10.3*  HCT 34.8* 33.0*  MCV 88.1 85.5  PLT 226 950   Basic Metabolic Panel: Recent Labs  Lab 10/19/18 1915 10/20/18 0731  NA 136 138  K 4.2 3.9  CL 104 106  CO2 23 23  GLUCOSE 236* 151*  BUN 16 13  CREATININE 1.20 1.05  CALCIUM 9.3 8.4*  MG 1.7 1.9  PHOS  --  3.5   GFR: Estimated Creatinine Clearance: 51.5 mL/min (by C-G formula based on SCr of 1.05 mg/dL). Liver Function Tests: No results for input(s): AST, ALT, ALKPHOS, BILITOT, PROT, ALBUMIN in the last 168 hours. Recent Labs  Lab 10/20/18 0731  LIPASE 38   No results for input(s): AMMONIA in the last 168 hours. Coagulation Profile: No results for input(s): INR, PROTIME in the last 168 hours. Cardiac Enzymes: No results for input(s): CKTOTAL, CKMB, CKMBINDEX, TROPONINI in the last 168 hours. BNP (last 3 results) No results for input(s): PROBNP in the last 8760 hours. HbA1C: No results for input(s): HGBA1C in the last 72 hours. CBG: Recent Labs  Lab 10/19/18 1912 10/20/18 9326 10/20/18 1117 10/20/18 1636  GLUCAP 214* 181* 194* 176*   Lipid Profile: No results for input(s): CHOL, HDL, LDLCALC, TRIG, CHOLHDL, LDLDIRECT in the last 72 hours. Thyroid Function Tests: Recent Labs    10/20/18 0731  TSH 1.468   Anemia Panel: No results for input(s): VITAMINB12, FOLATE, FERRITIN, TIBC, IRON, RETICCTPCT in the last 72 hours. Sepsis Labs: No results for  input(s): PROCALCITON, LATICACIDVEN in the last 168 hours.  Recent Results (from the past 240 hour(s))  SARS Coronavirus 2 (CEPHEID - Performed in [place] hospital lab), Hosp Order     Status: None   Collection Time: 10/19/18  7:33 PM   Specimen: Nasopharyngeal Swab  Result Value Ref Range Status   SARS Coronavirus 2 NEGATIVE NEGATIVE Final    Comment: (NOTE) If result is NEGATIVE SARS-CoV-2 target nucleic acids are NOT DETECTED. The SARS-CoV-2 RNA is generally detectable in upper and lower  respiratory specimens during the acute phase of infection. The lowest  concentration of SARS-CoV-2 viral copies this assay can detect is 250  copies / mL. A negative result does not preclude SARS-CoV-2 infection  and should not be used as the sole basis for treatment or other  patient management decisions.  A negative result may occur with  improper specimen collection / handling, submission of specimen other  than nasopharyngeal swab, presence of viral mutation(s) within the  areas targeted by this assay, and inadequate number of viral copies  (<250 copies / mL). A negative result must be combined with clinical  observations, patient history, and epidemiological information. If result is POSITIVE SARS-CoV-2 target nucleic acids are DETECTED. The SARS-CoV-2 RNA is generally detectable in upper and lower  respiratory specimens dur ing the acute phase of infection.  Positive  results are indicative of active infection with SARS-CoV-2.  Clinical  correlation with patient history and other diagnostic information is  necessary to determine patient infection status.  Positive results do  not rule out bacterial infection or co-infection with other viruses. If result is PRESUMPTIVE POSTIVE SARS-CoV-2 nucleic acids MAY BE PRESENT.   A presumptive positive result was obtained on the submitted specimen  and confirmed on repeat testing.  While 2019 novel coronavirus  (SARS-CoV-2) nucleic acids may be  present in the submitted sample  additional confirmatory testing may be necessary for epidemiological  and / or clinical management purposes  to differentiate between  SARS-CoV-2 and other Sarbecovirus currently known to infect humans.  If clinically indicated additional testing with an alternate test  methodology [phone]) is advised. The SARS-CoV-2 RNA is generally  detectable in upper and lower respiratory sp ecimens during the acute  phase of infection. The expected result is Negative. Fact Sheet for Patients:  StrictlyIdeas.no Fact Sheet for Healthcare Providers: BankingDealers.co.za This test is not yet approved or cleared by the [place] FDA and has been authorized for detection and/or diagnosis of SARS-CoV-2 by FDA under an Emergency Use Authorization (EUA).  This EUA will remain in effect (meaning this test can be used) for the duration of the COVID-19 declaration under Section 564(b)(1) of the Act, 21 U.S.C. section 360bbb-3(b)(1), unless the authorization is terminated or revoked sooner. Performed at [facility], [place] [address], [place], [place] [address]          Radiology Studies: Ct Head Wo Contrast  Result Date: 10/19/2018 CLINICAL DATA:  Head trauma.  Ataxia. EXAM: CT HEAD WITHOUT CONTRAST TECHNIQUE: Contiguous axial images were obtained from the base of the skull through the vertex without intravenous contrast. COMPARISON:  CT head dated April 14, 2018 FINDINGS: Brain: No evidence of acute infarction, hemorrhage, hydrocephalus, extra-axial collection or mass lesion/mass effect. Volume loss and chronic microvascular ischemic changes are noted. There is an old infarct in the left cerebellum. Vascular: No hyperdense vessel or unexpected calcification. Skull: Normal. Negative for fracture or focal lesion. Sinuses/Orbits: No acute finding. Other: None. IMPRESSION: No acute intracranial abnormality. Electronically  Signed   By: Constance Holster M.D.   On: 10/19/2018 21:17        Scheduled Meds: . enoxaparin (LOVENOX) injection  40 mg Subcutaneous Q24H  . pantoprazole  40 mg Oral BID   Continuous Infusions: . sodium chloride 75 mL/hr at 10/20/18 1337  . cefTRIAXone (ROCEPHIN)  IV       LOS: 0 days     Cordelia Poche, MD [facility] 10/20/2018, 5:21 PM  If 7PM-7AM, please contact night-coverage [email or web address]

## 2018-10-20 NOTE — Plan of Care (Signed)

## 2018-10-21 ENCOUNTER — Encounter (HOSPITAL_COMMUNITY)
Admission: EM | Disposition: A | Discharge status: Home Health | Payer: Self-pay | Source: Home / Self Care | Attending: Internal Medicine

## 2018-10-21 ENCOUNTER — Encounter (HOSPITAL_COMMUNITY): Payer: Self-pay | Admitting: Internal Medicine

## 2018-10-21 HISTORY — PX: PACEMAKER IMPLANT: EP1218

## 2018-10-21 LAB — GLUCOSE, CAPILLARY
Glucose-Capillary: 141 mg/dL — ABNORMAL HIGH (ref 70–99)
Glucose-Capillary: 149 mg/dL — ABNORMAL HIGH (ref 70–99)
Glucose-Capillary: 181 mg/dL — ABNORMAL HIGH (ref 70–99)
Glucose-Capillary: 148 mg/dL — ABNORMAL HIGH (ref 70–99)

## 2018-10-21 LAB — URINE CULTURE

## 2018-10-21 SURGERY — PACEMAKER IMPLANT

## 2018-10-21 MED ORDER — SODIUM CHLORIDE 0.9 % IV SOLN
80.0000 mg | INTRAVENOUS | Status: AC
  Administered 2018-10-21: 80 mg
  Filled 2018-10-21: qty 2

## 2018-10-21 MED ORDER — LIDOCAINE HCL (PF) 1 % IJ SOLN
INTRAMUSCULAR | Status: DC | PRN
  Administered 2018-10-21: 60 mL

## 2018-10-21 MED ORDER — LIDOCAINE HCL 1 % IJ SOLN
INTRAMUSCULAR | Status: AC
  Filled 2018-10-21: qty 60

## 2018-10-21 MED ORDER — MIDAZOLAM HCL 5 MG/5ML IJ SOLN
INTRAMUSCULAR | Status: AC
  Filled 2018-10-21: qty 5

## 2018-10-21 MED ORDER — CEFAZOLIN SODIUM-DEXTROSE 2-4 GM/100ML-% IV SOLN
2.0000 g | INTRAVENOUS | Status: AC
  Administered 2018-10-21: 2 g via INTRAVENOUS
  Filled 2018-10-21: qty 100

## 2018-10-21 MED ORDER — ONDANSETRON HCL 4 MG/2ML IJ SOLN: 4.0000 mg | Freq: Four times a day (QID) | INTRAMUSCULAR | Status: DC | PRN

## 2018-10-21 MED ORDER — IOHEXOL 350 MG/ML SOLN
INTRAVENOUS | Status: DC | PRN
  Administered 2018-10-21: 10 mL via INTRAVENOUS

## 2018-10-21 MED ORDER — HEPARIN (PORCINE) IN NACL 1000-0.9 UT/500ML-% IV SOLN
INTRAVENOUS | Status: AC
  Filled 2018-10-21: qty 500

## 2018-10-21 MED ORDER — HEPARIN (PORCINE) IN NACL 1000-0.9 UT/500ML-% IV SOLN
INTRAVENOUS | Status: DC | PRN
  Administered 2018-10-21: 500 mL

## 2018-10-21 MED ORDER — FENTANYL CITRATE (PF) 100 MCG/2ML IJ SOLN
INTRAMUSCULAR | Status: DC | PRN
  Administered 2018-10-21: 25 ug via INTRAVENOUS

## 2018-10-21 MED ORDER — MIDAZOLAM HCL 5 MG/5ML IJ SOLN
INTRAMUSCULAR | Status: DC | PRN
  Administered 2018-10-21 (×2): 1 mg via INTRAVENOUS

## 2018-10-21 MED ORDER — CEFAZOLIN SODIUM-DEXTROSE 2-4 GM/100ML-% IV SOLN
INTRAVENOUS | Status: AC
  Filled 2018-10-21: qty 100

## 2018-10-21 MED ORDER — FENTANYL CITRATE (PF) 100 MCG/2ML IJ SOLN
INTRAMUSCULAR | Status: AC
  Filled 2018-10-21: qty 2

## 2018-10-21 MED ORDER — ACETAMINOPHEN 325 MG PO TABS: 325.0000 mg | ORAL_TABLET | ORAL | Status: DC | PRN

## 2018-10-21 MED ORDER — CEFAZOLIN SODIUM-DEXTROSE 1-4 GM/50ML-% IV SOLN: 1.0000 g | Freq: Four times a day (QID) | INTRAVENOUS | Status: DC

## 2018-10-21 MED ORDER — SODIUM CHLORIDE 0.9 % IV SOLN
INTRAVENOUS | Status: AC
  Filled 2018-10-21: qty 2

## 2018-10-21 SURGICAL SUPPLY — 8 items
CABLE SURGICAL S-101-97-12 (CABLE) ×3 IMPLANT
HEMOSTAT SURGICEL 2X4 FIBR (HEMOSTASIS) ×2 IMPLANT
LEAD CAPSURE NOVUS 5076-52CM (Lead) ×2 IMPLANT
LEAD CAPSURE NOVUS 5076-58CM (Lead) ×2 IMPLANT
PACEMAKER ASSURITY DR-RF (Pacemaker) ×2 IMPLANT
PAD PRO RADIOLUCENT 2001M-C (PAD) ×3 IMPLANT
SHEATH 7FR PRELUDE SNAP 13 (SHEATH) ×4 IMPLANT
TRAY PACEMAKER INSERTION (PACKS) ×3 IMPLANT

## 2018-10-21 NOTE — Progress Notes (Signed)
VAST consulted to place 2nd IV access for procedure. Upon arrival to pt's bedside, transporter arrived to take pt to cath lab. Tech called cath lab who stated they would start 2nd IV access.

## 2018-10-21 NOTE — Progress Notes (Addendum)
Progress Note  Patient Name: [name] Date of Encounter: 10/21/2018  Primary Cardiologist: Glenetta Hew, MD  EP: Dr. Curt Bears  Subjective   Pt denies cough (outside of his COPD), no SOB, no CP or palpitations.  Had some epigastric discomfort last night.  No syncope since here  Inpatient Medications    Scheduled Meds: . chlorhexidine  60 mL Topical Once  . enoxaparin (LOVENOX) injection  40 mg Subcutaneous Q24H  . pantoprazole  40 mg Oral BID   Continuous Infusions: . sodium chloride 75 mL/hr at 10/20/18 1337  . sodium chloride 50 mL/hr at 10/21/18 0051  . cefTRIAXone (ROCEPHIN)  IV 1 g (10/20/18 2227)   PRN Meds:    Vital Signs    Vitals:   10/20/18 0832 10/20/18 1236 10/20/18 1933 10/21/18 0553  BP: 125/75 (!) 141/75 132/77 131/86  Pulse: 74 80 82 82  Resp: 20 18 18 20   Temp: 97.7 F (36.5 C) 97.7 F (36.5 C) 98.2 F (36.8 C) 97.8 F (36.6 C)  TempSrc: Oral Oral Oral   SpO2: 98% 98% 100% 97%  Weight:        Intake/Output Summary (Last 24 hours) at 10/21/2018 0955 Last data filed at 10/21/2018 0806 Gross per 24 hour  Intake 1151.95 ml  Output 2325 ml  Net -1173.05 ml   Last 3 Weights 10/20/2018 10/07/2018 10/02/2018  Weight (lbs) 164 lb 3.2 oz 163 lb 161 lb 12.8 oz  Weight (kg) 74.481 kg 73.936 kg 73.392 kg      Telemetry    AFlutter, 80's today so far no brady events (off home dilt) - Personally Reviewed  ECG    No new EKGs - Personally Reviewed  Physical Exam   GEN: No acute distress.   Neck: No JVD Cardiac: irreg-irreg, no murmurs, rubs, or gallops.  Respiratory: course BS b/l, partial clearing with cough, no wheezes. GI: Soft, nontender  MS: No edema; No deformity. Neuro:  Nonfocal  Psych: Normal affect   Labs    High Sensitivity Troponin:  No results for input(s): TROPONINIHS in the last 720 hours.    Cardiac EnzymesNo results for input(s): TROPONINI in the last 168 hours. No results for input(s): TROPIPOC in the last 168 hours.    Chemistry Recent Labs  Lab 10/19/18 1915 10/20/18 0731  NA 136 138  K 4.2 3.9  CL 104 106  CO2 23 23  GLUCOSE 236* 151*  BUN 16 13  CREATININE 1.20 1.05  CALCIUM 9.3 8.4*  GFRNONAA 57* >60  GFRAA >60 >60  ANIONGAP 9 9     Hematology Recent Labs  Lab 10/19/18 1915 10/20/18 0731  WBC 4.7 4.8  RBC 3.95* 3.86*  HGB 10.5* 10.3*  HCT 34.8* 33.0*  MCV 88.1 85.5  MCH 26.6 26.7  MCHC 30.2 31.2  RDW 15.2 15.1  PLT 226 223    BNPNo results for input(s): BNP, PROBNP in the last 168 hours.   DDimer No results for input(s): DDIMER in the last 168 hours.   Radiology    Ct Head Wo Contrast Result Date: 10/19/2018 CLINICAL DATA:  Head trauma.  Ataxia. EXAM: CT HEAD WITHOUT CONTRAST TECHNIQUE: Contiguous axial images were obtained from the base of the skull through the vertex without intravenous contrast. COMPARISON:  CT head dated April 14, 2018 FINDINGS: Brain: No evidence of acute infarction, hemorrhage, hydrocephalus, extra-axial collection or mass lesion/mass effect. Volume loss and chronic microvascular ischemic changes are noted. There is an old infarct in the left cerebellum. Vascular: No  hyperdense vessel or unexpected calcification. Skull: Normal. Negative for fracture or focal lesion. Sinuses/Orbits: No acute finding. Other: None. IMPRESSION: No acute intracranial abnormality. Electronically Signed   By: Constance Holster M.D.   On: 10/19/2018 21:17    Cardiac Studies   10/05/17: TTE Study Conclusions - Left ventricle: The cavity size was normal. Wall thickness was increased in a pattern of mild LVH. Systolic function was normal. The estimated ejection fraction was in the range of 55% to 60%. - Aortic valve: There was trivial regurgitation. - Mitral valve: There was mild regurgitation. - Left atrium: The atrium was mildly dilated.  10/05/17; LHC  Dist RCA lesion is 40% stenosed.  Ost 1st Sept lesion is 95% stenosed.  LV end diastolic pressure is normal.   1. Single vessel CAD involving the ostium of the first septal perforator. Otherwise nonobstructive disease. 2. Normal LVEDP  Patient Profile     78 y.o. male COPD, GIB, chronic CHF (diastolic), arthritis, DM, HTN, HLD, PAFib/flutter (atypical), CAD, CVA  Last EP interaction was consult with Dr. Caryl Comes for palpitations, at that time noted 01/18/18 for AFib w/RVR, he was admitted with vague rambling physical complaints, noted his SBP high 8-0's-90's, HR 130's.  He is not a/c 2/2 GIB history With improvement in his BP was started on dilt gtt and then titrated PO dosing, notes report bradycardia to 40's-50's (one pause was mentioned of 2.5seconds Pt was concerned about low iron levels, his Hgb was low but stable and recommended to keep his out patient follow up in place for this.  He was discharged on Dilt 240mg  daily Follow up out patient with Dr Curt Bears with few palpitations complaints it seems, one visit discussed using infrequently a PRN dose of 30mg  of the dilt, 02/19/18 his dilt was increased to 300mg  daily, and on 12/4 discussed pacer for likely tachy-brady, and discussed possibly in the future if post pacing unable to rate control an AV node ablation. It seems he was planend for PPM, though cancelled by the patient (unclear why, though seems the patient was concerned he would be unable to care for himself post-op) Discussed PPM at that time, though the patient insisted he needed to leave and go home, also urged to at least wait for social services evaluation for home needs and he declined.  He comes in now with syncope, noted to have pauses as long as 7 seconds and again, recommended to undergo pacing.  LABS COVID negative K+ 3.9 Mag 1.9 BUN/Creat 13/1.05 WBC 4.8 H/H 10/33 (chronically) plts 223  UA Abnormal w/likely UTI afebrile  TSH 1.468   Assessment & Plan    1. Syncope 2. Tachy-brady with prolonged pauses up to 7 seconds      Planned for PPM this Am   3. Persistent  atypical Aflutter     H/o PVI ablation 2012 (Dr. [name])     [place] is 6, by record, not on a/c 2/2 GIB   4. UTI     seen in the ED on 10/07/18 with hematuria and dizziness in the setting of recent lithotripsy. UA was abnormal and he was subsequently started on a 7 day course of ciprofloxacin      Though pt never took antibiotic     Started here     Deferred to IM  5. Course breath sounds     Known COPD     Nearly clears with cough     Deferred to IM        For questions  or updates, please contact [place] Please consult [email or web address] for contact info under        Signed, Baldwin Jamaica, PA-C  10/21/2018, 9:55 AM    Syncope  Tachybrady syndrome  Afib/flutter  COPD  UTI --recent lithotripsy   Seen by Dr Elliot Cousin, with evidence of tachybrady syndrome with last sinus rhythm that I can find 2/19  There has been an issue in the past regarding long term anticoagulation, deferred 2/2 pt disinclination--GI bleeding  Needs pacing and will place a dual chamber device with the thought that perhaps sinus may be a therapeutic target  The benefits and risks were reviewed including but not limited to death,  perforation, infection, lead dislodgement and device malfunction.  The patient understands agrees and is willing to proceed.

## 2018-10-21 NOTE — Progress Notes (Signed)
PROGRESS NOTE  [name] FGH:[id number] DOB: 26-May-1940 DOA: 10/19/2018 PCP: Scot Jun, FNP  HPI/Recap of past 24 hours: Patient is a 78 year old male with past medical history significant for COPD, GI bleed, chronic congestive heart failure (diastolic), coronary artery disease, diabetes mellitus, hypertension, hyperlipidemia, paroxysmal atrial fibrillation/flutter with tachybradycardia syndrome and pauses not on anticoagulation due to GI bleed and arthritis.  Apparently, option of permanent pacemaker placement has been broached in the past.  Patient reported initially feeling well after dinner tonight, but may have passed out and was found on the floor by the daughter.  Patient could not recall events leading to the fall.  Significant slow A. fib was noted.  The daughter called EMS and patient was transferred to the hospital.  No headache, no neck pain, no fever or chills, no chest pain, no shortness of breath, no vomiting no urinary symptoms.  However, patient reports nausea and vague upper abdominal discomfort.  Patient reported a history of prior nephrolithiasis.  Patient be admitted for further assessment and management.  Cardiology team is already consulted on the patient.  ED Course: On presentation to the emergency department, temperature was 97.5, heart rate ranges from 35 bpm to 65 bpm, respiratory rate of 21, blood pressure 118/64 mmHg and O2 sat of 95%.  Labs includes hemoglobin of 10.5 g/dL, serum creatinine of 1.2 (up from 0.9), blood sugar of 236, negative rapid COVID testing, UA today suggestive of possible UTI (large leukocytosis, small hemoglobin, urine WBC of greater than 50).  CT head has not shown any acute abnormalities.  Pertinent labs: Chemistry reveals sodium of 136, potassium of 4.2, chloride 104, CO2 23, BUN of 16, creatinine of 1.2 blood sugar of 236.  CBC reveals WBC of 4.7, hemoglobin of 10.5, hematocrit of 34.8, MCV of 88.1 with platelet count of 226.  UA is  as documented above.   10/21/18: Patient was seen and examined at his bedside.  No acute events overnight.  Denies dizziness or palpitations at this time.  Admits to suprapubic discomfort.  He has an indwelling Foley catheter in place for chronic urine retention.  Assessment/Plan: Active Problems:   Syncope  Syncope likely cardiogenic with history of cardiac pauses Electrophysiology cardiology has been consulted and following Ordered orthostatic vital signs Continue gentle IV fluid hydration normal saline at 75 cc/h x 1 day PT OT to assess Fall precautions  Symptomatic tachybradycardia syndrome/paroxysmal A. fib Possible PPM insertion on 10/21/2018 per electrophysiology On Cardizem at home Not on anticoagulation due to fall risk  Suspected complicated UTI in the setting of indwelling Foley catheter UA positive for pyuria Urine culture in process Continue Rocephin empirically  Chronic anxiety/depression Continue Celexa Exam GERD Continue Protonix 40 mg twice daily  Hyperlipidemia Continue atorvastatin  BPH Continue Flomax  Type 2 diabetes Hemoglobin A1c Continue insulin sliding scale Continue to hold off metformin  Ambulatory dysfunction with falls PT OT to assist Fall precautions  Risks: Patient is high risk for decompensation due to recent syncopal episode, tacky bradycardy syndrome, multiple comorbidities and advanced age.  Patient will require at least 2 midnights for further evaluation and treatment of present condition.  DVT prophylaxis:  Subcu Lovenox daily   Code Status: Full code Family Communication:  Will call the family for care with the patient. Disposition Plan:  Will discharge to home possibly in 1 to 2 days when cardiology signs off. Consults called: Cardiology/electrophysiology  Objective: Vitals:   10/20/18 0832 10/20/18 1236 10/20/18 1933 10/21/18 0553  BP: 125/75 [name])  141/75 132/77 131/86  Pulse: 74 80 82 82  Resp: 20 18 18 20   Temp: 97.7  F (36.5 C) 97.7 F (36.5 C) 98.2 F (36.8 C) 97.8 F (36.6 C)  TempSrc: Oral Oral Oral   SpO2: 98% 98% 100% 97%  Weight:        Intake/Output Summary (Last 24 hours) at 10/21/2018 0947 Last data filed at 10/21/2018 8032 Gross per 24 hour  Intake 1151.95 ml  Output 2325 ml  Net -1173.05 ml   Filed Weights   10/20/18 0405  Weight: 74.5 kg    Exam:   General: 78 y.o. year-old male well developed well nourished in no acute distress.  Alert and interactive.  Cardiovascular: Regular rate and rhythm with no rubs or gallops.  No thyromegaly or JVD noted.    Respiratory: Clear to auscultation with no wheezes or rales. Good inspiratory effort.  Abdomen: Soft nontender nondistended with normal bowel sounds x4 quadrants.  Musculoskeletal: Trace lower extremity edema. 2/4 pulses in all 4 extremities.  Indwelling Foley catheter in place.  Psychiatry: Mood is appropriate for condition and setting   Data Reviewed: CBC: Recent Labs  Lab 10/19/18 1915 10/20/18 0731  WBC 4.7 4.8  HGB 10.5* 10.3*  HCT 34.8* 33.0*  MCV 88.1 85.5  PLT 226 122   Basic Metabolic Panel: Recent Labs  Lab 10/19/18 1915 10/20/18 0731  NA 136 138  K 4.2 3.9  CL 104 106  CO2 23 23  GLUCOSE 236* 151*  BUN 16 13  CREATININE 1.20 1.05  CALCIUM 9.3 8.4*  MG 1.7 1.9  PHOS  --  3.5   GFR: Estimated Creatinine Clearance: 51.5 mL/min (by C-G formula based on SCr of 1.05 mg/dL). Liver Function Tests: No results for input(s): AST, ALT, ALKPHOS, BILITOT, PROT, ALBUMIN in the last 168 hours. Recent Labs  Lab 10/20/18 0731  LIPASE 38   No results for input(s): AMMONIA in the last 168 hours. Coagulation Profile: No results for input(s): INR, PROTIME in the last 168 hours. Cardiac Enzymes: No results for input(s): CKTOTAL, CKMB, CKMBINDEX, TROPONINI in the last 168 hours. BNP (last 3 results) No results for input(s): PROBNP in the last 8760 hours. HbA1C: No results for input(s): HGBA1C in the  last 72 hours. CBG: Recent Labs  Lab 10/20/18 0644 10/20/18 1117 10/20/18 1636 10/20/18 2112 10/21/18 0624  GLUCAP 181* 194* 176* 219* 141*   Lipid Profile: No results for input(s): CHOL, HDL, LDLCALC, TRIG, CHOLHDL, LDLDIRECT in the last 72 hours. Thyroid Function Tests: Recent Labs    10/20/18 0731  TSH 1.468   Anemia Panel: No results for input(s): VITAMINB12, FOLATE, FERRITIN, TIBC, IRON, RETICCTPCT in the last 72 hours. Urine analysis:    Component Value Date/Time   COLORURINE YELLOW 10/19/2018 1933   APPEARANCEUR CLOUDY (A) 10/19/2018 1933   LABSPEC 1.016 10/19/2018 1933   PHURINE 5.0 10/19/2018 1933   GLUCOSEU NEGATIVE 10/19/2018 1933   HGBUR SMALL (A) 10/19/2018 1933   BILIRUBINUR NEGATIVE 10/19/2018 1933   BILIRUBINUR negative 10/02/2018 0915   KETONESUR NEGATIVE 10/19/2018 1933   PROTEINUR 30 (A) 10/19/2018 1933   UROBILINOGEN 0.2 10/02/2018 0915   UROBILINOGEN 0.2 10/09/2014 2315   NITRITE NEGATIVE 10/19/2018 1933   LEUKOCYTESUR LARGE (A) 10/19/2018 1933   Sepsis Labs: @LABRCNTIP (procalcitonin:4,lacticidven:4)  ) Recent Results (from the past 240 hour(s))  SARS Coronavirus 2 (CEPHEID - Performed in [facility] hospital lab), Hosp Order     Status: None   Collection Time: 10/19/18  7:33 PM  Specimen: Nasopharyngeal Swab  Result Value Ref Range Status   SARS Coronavirus 2 NEGATIVE NEGATIVE Final    Comment: (NOTE) If result is NEGATIVE SARS-CoV-2 target nucleic acids are NOT DETECTED. The SARS-CoV-2 RNA is generally detectable in upper and lower  respiratory specimens during the acute phase of infection. The lowest  concentration of SARS-CoV-2 viral copies this assay can detect is 250  copies / mL. A negative result does not preclude SARS-CoV-2 infection  and should not be used as the sole basis for treatment or other  patient management decisions.  A negative result may occur with  improper specimen collection / handling, submission of specimen  other  than nasopharyngeal swab, presence of viral mutation(s) within the  areas targeted by this assay, and inadequate number of viral copies  (<250 copies / mL). A negative result must be combined with clinical  observations, patient history, and epidemiological information. If result is POSITIVE SARS-CoV-2 target nucleic acids are DETECTED. The SARS-CoV-2 RNA is generally detectable in upper and lower  respiratory specimens dur ing the acute phase of infection.  Positive  results are indicative of active infection with SARS-CoV-2.  Clinical  correlation with patient history and other diagnostic information is  necessary to determine patient infection status.  Positive results do  not rule out bacterial infection or co-infection with other viruses. If result is PRESUMPTIVE POSTIVE SARS-CoV-2 nucleic acids MAY BE PRESENT.   A presumptive positive result was obtained on the submitted specimen  and confirmed on repeat testing.  While 2019 novel coronavirus  (SARS-CoV-2) nucleic acids may be present in the submitted sample  additional confirmatory testing may be necessary for epidemiological  and / or clinical management purposes  to differentiate between  SARS-CoV-2 and other Sarbecovirus currently known to infect humans.  If clinically indicated additional testing with an alternate test  methodology [phone]) is advised. The SARS-CoV-2 RNA is generally  detectable in upper and lower respiratory sp ecimens during the acute  phase of infection. The expected result is Negative. Fact Sheet for Patients:  StrictlyIdeas.no Fact Sheet for Healthcare Providers: BankingDealers.co.za This test is not yet approved or cleared by the [place] FDA and has been authorized for detection and/or diagnosis of SARS-CoV-2 by FDA under an Emergency Use Authorization (EUA).  This EUA will remain in effect (meaning this test can be used) for the duration  of the COVID-19 declaration under Section 564(b)(1) of the Act, 21 U.S.C. section 360bbb-3(b)(1), unless the authorization is terminated or revoked sooner. Performed at [facility], [place] [address], [place], [place] [address]   Urine culture     Status: Abnormal   Collection Time: 10/19/18  7:33 PM   Specimen: Urine, Catheterized  Result Value Ref Range Status   Specimen Description URINE, CATHETERIZED  Final   Special Requests   Final    NONE Performed at [facility], [place] [address] [place], [place] [address]    Culture MULTIPLE SPECIES PRESENT, SUGGEST RECOLLECTION (A)  Final   Report Status 10/21/2018 FINAL  Final  Surgical PCR screen     Status: None   Collection Time: 10/20/18  7:36 PM   Specimen: Nasal Mucosa; Nasal Swab  Result Value Ref Range Status   MRSA, PCR NEGATIVE NEGATIVE Final   Staphylococcus aureus NEGATIVE NEGATIVE Final    Comment: (NOTE) The Xpert SA Assay (FDA approved for NASAL specimens in patients 65 years of age and older), is one component of a comprehensive surveillance program. It is  not intended to diagnose infection nor to guide or monitor treatment. Performed at [facility], [place] [address], [place], [place] [address]       Studies: No results found.  Scheduled Meds:  chlorhexidine  60 mL Topical Once   enoxaparin (LOVENOX) injection  40 mg Subcutaneous Q24H   pantoprazole  40 mg Oral BID    Continuous Infusions:  sodium chloride 75 mL/hr at 10/20/18 1337   sodium chloride 50 mL/hr at 10/21/18 0051   cefTRIAXone (ROCEPHIN)  IV 1 g (10/20/18 2227)     LOS: 1 day     Kayleen Memos, MD [facility] Pager [phone]  If 7PM-7AM, please contact night-coverage [email or web address] Password [facility] 10/21/2018, 9:47 AM

## 2018-10-22 ENCOUNTER — Inpatient Hospital Stay (HOSPITAL_COMMUNITY): Payer: Medicare Other

## 2018-10-22 ENCOUNTER — Other Ambulatory Visit: Payer: Self-pay

## 2018-10-22 DIAGNOSIS — I4892 Unspecified atrial flutter: Secondary | ICD-10-CM

## 2018-10-22 LAB — BASIC METABOLIC PANEL
Anion gap: 8 (ref 5–15)
BUN: 12 mg/dL (ref 8–23)
CO2: 23 mmol/L (ref 22–32)
Calcium: 8.5 mg/dL — ABNORMAL LOW (ref 8.9–10.3)
Chloride: 105 mmol/L (ref 98–111)
Creatinine, Ser: 0.96 mg/dL (ref 0.61–1.24)
GFR calc Af Amer: >60 mL/min (ref >60)
GFR calc non Af Amer: >60 mL/min (ref >60)
Glucose, Bld: 160 mg/dL — ABNORMAL HIGH (ref 70–99)
Potassium: 3.7 mmol/L (ref 3.5–5.1)
Sodium: 136 mmol/L (ref 135–145)

## 2018-10-22 LAB — GLUCOSE, CAPILLARY
Glucose-Capillary: 147 mg/dL — ABNORMAL HIGH (ref 70–99)
Glucose-Capillary: 171 mg/dL — ABNORMAL HIGH (ref 70–99)
Glucose-Capillary: 165 mg/dL — ABNORMAL HIGH (ref 70–99)
Glucose-Capillary: 161 mg/dL — ABNORMAL HIGH (ref 70–99)

## 2018-10-22 LAB — CBC
HCT: 35.3 % — ABNORMAL LOW (ref 39.0–52.0)
Hemoglobin: 11.1 g/dL — ABNORMAL LOW (ref 13.0–17.0)
MCH: 26.8 pg (ref 26.0–34.0)
MCHC: 31.4 g/dL (ref 30.0–36.0)
MCV: 85.3 fL (ref 80.0–100.0)
Platelets: 210 10*3/uL (ref 150–400)
RBC: 4.14 MIL/uL — ABNORMAL LOW (ref 4.22–5.81)
RDW: 14.9 % (ref 11.5–15.5)
WBC: 5.5 10*3/uL (ref 4.0–10.5)
nRBC: 0 % (ref 0.0–0.2)

## 2018-10-22 MED ORDER — DILTIAZEM HCL ER COATED BEADS 180 MG PO CP24
360.0000 mg | ORAL_CAPSULE | Freq: Every day | ORAL | Status: DC
  Administered 2018-10-22 – 2018-10-23 (×2): 360 mg via ORAL
  Filled 2018-10-22 (×2): qty 2

## 2018-10-22 MED ORDER — TRAMADOL-ACETAMINOPHEN 37.5-325 MG PO TABS
1.0000 | ORAL_TABLET | Freq: Three times a day (TID) | ORAL | Status: DC | PRN
  Administered 2018-10-22: 1 via ORAL
  Filled 2018-10-22 (×2): qty 1

## 2018-10-22 NOTE — Progress Notes (Signed)
PROGRESS NOTE  [name] PXT:[id number] DOB: [birth year] DOA: 10/19/2018 PCP: Scot Jun, FNP  HPI/Recap of past 24 hours: Patient is a 78 year old male with past medical history significant for COPD, GI bleed, chronic congestive heart failure (diastolic), coronary artery disease, diabetes mellitus, hypertension, hyperlipidemia, paroxysmal atrial fibrillation/flutter with tachybradycardia syndrome and pauses not on anticoagulation due to GI bleed and arthritis.  Apparently, option of permanent pacemaker placement has been broached in the past.  Patient reported initially feeling well after dinner tonight, but may have passed out and was found on the floor by the daughter.  Patient could not recall events leading to the fall.  Significant slow A. fib was noted.  The daughter called EMS and patient was transferred to the hospital.  No headache, no neck pain, no fever or chills, no chest pain, no shortness of breath, no vomiting no urinary symptoms.  However, patient reports nausea and vague upper abdominal discomfort.  Patient reported a history of prior nephrolithiasis.  Patient be admitted for further assessment and management.  Cardiology team is already consulted on the patient.  ED Course: On presentation to the emergency department, temperature was 97.5, heart rate ranges from 35 bpm to 65 bpm, respiratory rate of 21, blood pressure 118/64 mmHg and O2 sat of 95%.  Labs includes hemoglobin of 10.5 g/dL, serum creatinine of 1.2 (up from 0.9), blood sugar of 236, negative rapid COVID testing, UA today suggestive of possible UTI (large leukocytosis, small hemoglobin, urine WBC of greater than 50).  CT head has not shown any acute abnormalities.  Pertinent labs: Chemistry reveals sodium of 136, potassium of 4.2, chloride 104, CO2 23, BUN of 16, creatinine of 1.2 blood sugar of 236.  CBC reveals WBC of 4.7, hemoglobin of 10.5, hematocrit of 34.8, MCV of 88.1 with platelet count of 226.  UA is  as documented above.  POD#1 post PPM placement on 10/21/18 by electrophysiology.  10/22/18: Patient was seen and examined at his bedside.  Worried about going home today.  New cardiac medication started, will closely monitor on telemetry and make adjustments if indicated. Admits to tenderness at the site of incision.  Ordered tramadol tid prn.  Assessment/Plan: Active Problems:   Syncope  Syncope likely cardiogenic with history of cardiac pauses Electrophysiology cardiology has been consulted and following Ordered orthostatic vital signs Continue gentle IV fluid hydration normal saline at 75 cc/h x 1 day PT OT to assess Fall precautions  Symptomatic tachybradycardia syndrome/paroxysmal A. fib post PPM placement Post PPM insertion on 10/21/2018 per electrophysiology Optimize pain control On Cardizem 360 mg daily, same as home dose Closely monitor heart rate, if stable DC home tomorrow morning If heart rate goes up add metoprolol 25 mg twice daily as recommended by electrophysiology. Not on anticoagulation due to fall risk  Suspected complicated UTI in the setting of indwelling Foley catheter UA positive for pyuria Urine culture suggested collection, reordered Continue Rocephin empirically Afebrile  Chronic anxiety/depression Continue Celexa  GERD Continue Protonix 40 mg twice daily  Hyperlipidemia Continue atorvastatin  BPH Continue Flomax  Type 2 diabetes Hemoglobin A1c 7.2 on 10/02/2018 Continue insulin sliding scale Continue to hold off metformin, resume at discharge  Ambulatory dysfunction with falls PT OT ordered Fall precautions  DVT prophylaxis:  Subcu Lovenox daily   Code Status: Full code Family Communication:  Will call the family for care with the patient. Disposition Plan:  Likely discharge tomorrow 10/23/2018.  Consults called: Cardiology/electrophysiology  Objective: Vitals:   10/21/18 1610 10/21/18  1845 10/21/18 2101 10/22/18 0615  BP: 133/85  137/82 138/85 129/81  Pulse: 92 89 85 94  Resp:   16 17  Temp:   98.3 F (36.8 C) 98 F (36.7 C)  TempSrc:   Oral Oral  SpO2: 98% 100% 98% 97%  Weight:    73.1 kg    Intake/Output Summary (Last 24 hours) at 10/22/2018 1452 Last data filed at 10/22/2018 1343 Gross per 24 hour  Intake 920 ml  Output 2450 ml  Net -1530 ml   Filed Weights   10/20/18 0405 10/22/18 0615  Weight: 74.5 kg 73.1 kg    Exam:  . General: 78 y.o. year-old male well-nourished in no acute distress.  Alert and oriented x3.   . Cardiovascular: Regular rate and rhythm no rubs or gallops no JVD or thyromegaly.  Site of incision of PPM clean with no drainage . Respiratory: Clear to auscultation no wheezes or rales.  .  Abdomen: Non-tender nondistended normal bowel sounds present.  Musculoskeletal: Trace lower extremity edema.  2 out of 4 pulses in all 4 extremities.  Indwelling Foley catheter in place.   [name] Psychiatry: Mood is anxious.   Data Reviewed: CBC: Recent Labs  Lab 10/19/18 1915 10/20/18 0731 10/22/18 0650  WBC 4.7 4.8 5.5  HGB 10.5* 10.3* 11.1*  HCT 34.8* 33.0* 35.3*  MCV 88.1 85.5 85.3  PLT 226 223 793   Basic Metabolic Panel: Recent Labs  Lab 10/19/18 1915 10/20/18 0731 10/22/18 0650  NA 136 138 136  K 4.2 3.9 3.7  CL 104 106 105  CO2 23 23 23   GLUCOSE 236* 151* 160*  BUN 16 13 12   CREATININE 1.20 1.05 0.96  CALCIUM 9.3 8.4* 8.5*  MG 1.7 1.9  --   PHOS  --  3.5  --    GFR: Estimated Creatinine Clearance: 56.3 mL/min (by C-G formula based on SCr of 0.96 mg/dL). Liver Function Tests: No results for input(s): AST, ALT, ALKPHOS, BILITOT, PROT, ALBUMIN in the last 168 hours. Recent Labs  Lab 10/20/18 0731  LIPASE 38   No results for input(s): AMMONIA in the last 168 hours. Coagulation Profile: No results for input(s): INR, PROTIME in the last 168 hours. Cardiac Enzymes: No results for input(s): CKTOTAL, CKMB, CKMBINDEX, TROPONINI in the last 168 hours. BNP (last 3 results)  No results for input(s): PROBNP in the last 8760 hours. HbA1C: No results for input(s): HGBA1C in the last 72 hours. CBG: Recent Labs  Lab 10/21/18 1119 10/21/18 1711 10/21/18 2237 10/22/18 0637 10/22/18 1107  GLUCAP 149* 181* 148* 147* 171*   Lipid Profile: No results for input(s): CHOL, HDL, LDLCALC, TRIG, CHOLHDL, LDLDIRECT in the last 72 hours. Thyroid Function Tests: Recent Labs    10/20/18 0731  TSH 1.468   Anemia Panel: No results for input(s): VITAMINB12, FOLATE, FERRITIN, TIBC, IRON, RETICCTPCT in the last 72 hours. Urine analysis:    Component Value Date/Time   COLORURINE YELLOW 10/19/2018 1933   APPEARANCEUR CLOUDY (A) 10/19/2018 1933   LABSPEC 1.016 10/19/2018 1933   PHURINE 5.0 10/19/2018 1933   GLUCOSEU NEGATIVE 10/19/2018 1933   HGBUR SMALL (A) 10/19/2018 1933   BILIRUBINUR NEGATIVE 10/19/2018 1933   BILIRUBINUR negative 10/02/2018 0915   KETONESUR NEGATIVE 10/19/2018 1933   PROTEINUR 30 (A) 10/19/2018 1933   UROBILINOGEN 0.2 10/02/2018 0915   UROBILINOGEN 0.2 10/09/2014 2315   NITRITE NEGATIVE 10/19/2018 1933   LEUKOCYTESUR LARGE (A) 10/19/2018 1933   Sepsis Labs: @LABRCNTIP (procalcitonin:4,lacticidven:4)  ) Recent Results (from the  past 240 hour(s))  SARS Coronavirus 2 (CEPHEID - Performed in [place] hospital lab), Hosp Order     Status: None   Collection Time: 10/19/18  7:33 PM   Specimen: Nasopharyngeal Swab  Result Value Ref Range Status   SARS Coronavirus 2 NEGATIVE NEGATIVE Final    Comment: (NOTE) If result is NEGATIVE SARS-CoV-2 target nucleic acids are NOT DETECTED. The SARS-CoV-2 RNA is generally detectable in upper and lower  respiratory specimens during the acute phase of infection. The lowest  concentration of SARS-CoV-2 viral copies this assay can detect is 250  copies / mL. A negative result does not preclude SARS-CoV-2 infection  and should not be used as the sole basis for treatment or other  patient management  decisions.  A negative result may occur with  improper specimen collection / handling, submission of specimen other  than nasopharyngeal swab, presence of viral mutation(s) within the  areas targeted by this assay, and inadequate number of viral copies  (<250 copies / mL). A negative result must be combined with clinical  observations, patient history, and epidemiological information. If result is POSITIVE SARS-CoV-2 target nucleic acids are DETECTED. The SARS-CoV-2 RNA is generally detectable in upper and lower  respiratory specimens dur ing the acute phase of infection.  Positive  results are indicative of active infection with SARS-CoV-2.  Clinical  correlation with patient history and other diagnostic information is  necessary to determine patient infection status.  Positive results do  not rule out bacterial infection or co-infection with other viruses. If result is PRESUMPTIVE POSTIVE SARS-CoV-2 nucleic acids MAY BE PRESENT.   A presumptive positive result was obtained on the submitted specimen  and confirmed on repeat testing.  While 2019 novel coronavirus  (SARS-CoV-2) nucleic acids may be present in the submitted sample  additional confirmatory testing may be necessary for epidemiological  and / or clinical management purposes  to differentiate between  SARS-CoV-2 and other Sarbecovirus currently known to infect humans.  If clinically indicated additional testing with an alternate test  methodology [phone]) is advised. The SARS-CoV-2 RNA is generally  detectable in upper and lower respiratory sp ecimens during the acute  phase of infection. The expected result is Negative. Fact Sheet for Patients:  StrictlyIdeas.no Fact Sheet for Healthcare Providers: BankingDealers.co.za This test is not yet approved or cleared by the [place] FDA and has been authorized for detection and/or diagnosis of SARS-CoV-2 by FDA under an  Emergency Use Authorization (EUA).  This EUA will remain in effect (meaning this test can be used) for the duration of the COVID-19 declaration under Section 564(b)(1) of the Act, 21 U.S.C. section 360bbb-3(b)(1), unless the authorization is terminated or revoked sooner. Performed at [facility], [place] [address] [place], [place] [address]   Urine culture     Status: Abnormal   Collection Time: 10/19/18  7:33 PM   Specimen: Urine, Catheterized  Result Value Ref Range Status   Specimen Description URINE, CATHETERIZED  Final   Special Requests   Final    NONE Performed at [facility], [place] [address] [place], [place] [address]    Culture MULTIPLE SPECIES PRESENT, SUGGEST RECOLLECTION (A)  Final   Report Status 10/21/2018 FINAL  Final  Surgical PCR screen     Status: None   Collection Time: 10/20/18  7:36 PM   Specimen: Nasal Mucosa; Nasal Swab  Result Value Ref Range Status   MRSA, PCR NEGATIVE NEGATIVE Final   Staphylococcus aureus NEGATIVE NEGATIVE  Final    Comment: (NOTE) The Xpert SA Assay (FDA approved for NASAL specimens in patients 67 years of age and older), is one component of a comprehensive surveillance program. It is not intended to diagnose infection nor to guide or monitor treatment. Performed at [facility], [place] [address], [place], [place] [address]       Studies: Dg Chest 2 View  Result Date: 10/22/2018 CLINICAL DATA:  Patient status post pacemaker placement 10/21/2018. EXAM: CHEST - 2 VIEW COMPARISON:  PA and lateral chest 07/10/2018 FINDINGS: New dual lead pacing device is in place with tips of the leads in the right atrium and right ventricle. Leads are intact. Generator is over the upper left chest. Lungs are clear. No pneumothorax or pleural effusion. Emphysema noted. Heart size is normal. Aortic atherosclerosis is identified. No acute or focal bony abnormality. IMPRESSION: Pacing leads project in the right atrium and right ventricle.  Negative for pneumothorax or acute disease. Emphysema. Atherosclerosis. Electronically Signed   By: Inge Rise M.D.   On: 10/22/2018 08:55    Scheduled Meds: . diltiazem  360 mg Oral Daily  . enoxaparin (LOVENOX) injection  40 mg Subcutaneous Q24H  . pantoprazole  40 mg Oral BID    Continuous Infusions: . sodium chloride 75 mL/hr at 10/20/18 1337  . cefTRIAXone (ROCEPHIN)  IV 1 g (10/21/18 2119)     LOS: 2 days     Kayleen Memos, MD [facility] Pager [phone]  If 7PM-7AM, please contact night-coverage [email or web address] Password [email or web address] 10/22/2018, 2:52 PM

## 2018-10-22 NOTE — Progress Notes (Addendum)
Progress Note  Patient Name: [name] Date of Encounter: 10/22/2018  Primary Cardiologist: Glenetta Hew, MD  EP: Dr. Curt Bears  Subjective   No complaints, hopes to go home today, Denies CP, palpitations or SOB, no pain at pacer site  Inpatient Medications    Scheduled Meds: . enoxaparin (LOVENOX) injection  40 mg Subcutaneous Q24H  . pantoprazole  40 mg Oral BID   Continuous Infusions: . sodium chloride 75 mL/hr at 10/20/18 1337  . cefTRIAXone (ROCEPHIN)  IV 1 g (10/21/18 2119)   PRN Meds:    Vital Signs    Vitals:   10/21/18 1610 10/21/18 1845 10/21/18 2101 10/22/18 0615  BP: 133/85 137/82 138/85 129/81  Pulse: 92 89 85 94  Resp:   16 17  Temp:   98.3 F (36.8 C) 98 F (36.7 C)  TempSrc:   Oral Oral  SpO2: 98% 100% 98% 97%  Weight:    73.1 kg    Intake/Output Summary (Last 24 hours) at 10/22/2018 0909 Last data filed at 10/22/2018 0858 Gross per 24 hour  Intake 680 ml  Output 3000 ml  Net -2320 ml   Last 3 Weights 10/22/2018 10/20/2018 10/07/2018  Weight (lbs) 161 lb 3.2 oz 164 lb 3.2 oz 163 lb  Weight (kg) 73.12 kg 74.481 kg 73.936 kg      Telemetry    AFlutter, 90's-low 100's - Personally Reviewed  ECG    AFlutter 100bpm - Personally Reviewed  Physical Exam   GEN: No acute distress.   Neck: No JVD Cardiac: irreg-irreg, no murmurs, rubs, or gallops.  Respiratory: clear b/l, no wheezes. GI: Soft, nontender  MS: No edema; No deformity. Neuro:  Nonfocal  Psych: Normal affect   Pacer site is stable, dermabond in place, no bleeding or hematoma  Labs    High Sensitivity Troponin:  No results for input(s): TROPONINIHS in the last 720 hours.    Cardiac EnzymesNo results for input(s): TROPONINI in the last 168 hours. No results for input(s): TROPIPOC in the last 168 hours.   Chemistry Recent Labs  Lab 10/19/18 1915 10/20/18 0731 10/22/18 0650  NA 136 138 136  K 4.2 3.9 3.7  CL 104 106 105  CO2 23 23 23   GLUCOSE 236* 151* 160*   BUN 16 13 12   CREATININE 1.20 1.05 0.96  CALCIUM 9.3 8.4* 8.5*  GFRNONAA 57* >60 >60  GFRAA >60 >60 >60  ANIONGAP 9 9 8      Hematology Recent Labs  Lab 10/19/18 1915 10/20/18 0731 10/22/18 0650  WBC 4.7 4.8 5.5  RBC 3.95* 3.86* 4.14*  HGB 10.5* 10.3* 11.1*  HCT 34.8* 33.0* 35.3*  MCV 88.1 85.5 85.3  MCH 26.6 26.7 26.8  MCHC 30.2 31.2 31.4  RDW 15.2 15.1 14.9  PLT 226 223 210    BNPNo results for input(s): BNP, PROBNP in the last 168 hours.   DDimer No results for input(s): DDIMER in the last 168 hours.   Radiology    Ct Head Wo Contrast Result Date: 10/19/2018 CLINICAL DATA:  Head trauma.  Ataxia. EXAM: CT HEAD WITHOUT CONTRAST TECHNIQUE: Contiguous axial images were obtained from the base of the skull through the vertex without intravenous contrast. COMPARISON:  CT head dated April 14, 2018 FINDINGS: Brain: No evidence of acute infarction, hemorrhage, hydrocephalus, extra-axial collection or mass lesion/mass effect. Volume loss and chronic microvascular ischemic changes are noted. There is an old infarct in the left cerebellum. Vascular: No hyperdense vessel or unexpected calcification. Skull: Normal. Negative for  fracture or focal lesion. Sinuses/Orbits: No acute finding. Other: None. IMPRESSION: No acute intracranial abnormality. Electronically Signed   By: Constance Holster M.D.   On: 10/19/2018 21:17    Cardiac Studies   10/05/17: TTE Study Conclusions - Left ventricle: The cavity size was normal. Wall thickness was increased in a pattern of mild LVH. Systolic function was normal. The estimated ejection fraction was in the range of 55% to 60%. - Aortic valve: There was trivial regurgitation. - Mitral valve: There was mild regurgitation. - Left atrium: The atrium was mildly dilated.  10/05/17; LHC  Dist RCA lesion is 40% stenosed.  Ost 1st Sept lesion is 95% stenosed.  LV end diastolic pressure is normal.  1. Single vessel CAD involving the ostium of  the first septal perforator. Otherwise nonobstructive disease. 2. Normal LVEDP  Patient Profile     78 y.o. male COPD, GIB, chronic CHF (diastolic), arthritis, DM, HTN, HLD, PAFib/flutter (atypical), CAD, CVA  Last EP interaction was consult with Dr. Caryl Comes for palpitations, at that time noted 01/18/18 for AFib w/RVR, he was admitted with vague rambling physical complaints, noted his SBP high 8-0's-90's, HR 130's.  He is not a/c 2/2 GIB history With improvement in his BP was started on dilt gtt and then titrated PO dosing, notes report bradycardia to 40's-50's (one pause was mentioned of 2.5seconds Pt was concerned about low iron levels, his Hgb was low but stable and recommended to keep his out patient follow up in place for this.  He was discharged on Dilt 240mg  daily Follow up out patient with Dr Curt Bears with few palpitations complaints it seems, one visit discussed using infrequently a PRN dose of 30mg  of the dilt, 02/19/18 his dilt was increased to 300mg  daily, and on 12/4 discussed pacer for likely tachy-brady, and discussed possibly in the future if post pacing unable to rate control an AV node ablation. It seems he was planend for PPM, though cancelled by the patient (unclear why, though seems the patient was concerned he would be unable to care for himself post-op) Discussed PPM at that time, though the patient insisted he needed to leave and go home, also urged to at least wait for social services evaluation for home needs and he declined.  He comes in now with syncope, noted to have pauses as long as 7 seconds and again, recommended to undergo pacing.   Assessment & Plan    1. Syncope 2. Tachy-brady with prolonged pauses up to 7 seconds      S/p PPM yesterday with Dr. Caryl Comes Site is stable Device check this Am with stable findings Routine post implant follow up is in place Wound care and activity restrictions were discussed with the patient this AM by Dr. Curt Bears I have  re-reviewed with the patient as well CXR this AM with no pneumothorax   3. Persistent atypical Aflutter     H/o PVI ablation 2012 (Dr. [name])     CHA2DS2Vasc is 6, by record, not on a/c 2/2 GIB       Rates with fair control, generally 90's-low 100's, intermittently with RVR, also had some V pacing last night  Resume home dilt today   4. UTI     seen in the ED on 10/07/18 with hematuria and dizziness in the setting of recent lithotripsy. UA was abnormal and he was subsequently started on a 7 day course of ciprofloxacin      Though pt never took antibiotic     Started here  Deferred to IM  5. Course breath sounds     Known COPD     Nearly clears with cough     Deferred to IM   OK from EP/PPM perspective tod discharge as long as HR generally remains low 100's and when otherwise medically ready Continue same home diltiazem EP follow up is in place        For questions or updates, please contact [facility] Please consult [email or web address] for contact info under        Signed, Baldwin Jamaica, PA-C  10/22/2018, 9:09 AM     I have seen and examined this patient with Tommye Standard.  Agree with above, note added to reflect my findings.  On exam, iRRR, no murmurs, lungs clear.  Patient status post [facility] Jude dual-chamber pacemaker implanted yesterday.  Device functioning well and chest x-ray stable without evidence of pneumothorax.  Okay for discharge from an EP perspective today.  If his heart rate remains around 100, would plan for discharge on 360 mg of diltiazem.  Should his heart rate start to go up, would plan to start him on metoprolol 25 mg twice a day.  Angelette Ganus M. Carmen Vallecillo MD 10/22/2018 9:42 AM

## 2018-10-23 LAB — GLUCOSE, CAPILLARY
Glucose-Capillary: 165 mg/dL — ABNORMAL HIGH (ref 70–99)
Glucose-Capillary: 158 mg/dL — ABNORMAL HIGH (ref 70–99)

## 2018-10-23 MED ORDER — TRAMADOL-ACETAMINOPHEN 37.5-325 MG PO TABS: 1.0000 | ORAL_TABLET | Freq: Two times a day (BID) | ORAL | 0 refills | Status: DC | PRN

## 2018-10-23 MED ORDER — CIPROFLOXACIN HCL 500 MG PO TABS
500.0000 mg | ORAL_TABLET | Freq: Two times a day (BID) | ORAL | Status: DC
  Administered 2018-10-23: 500 mg via ORAL
  Filled 2018-10-23: qty 1

## 2018-10-23 MED ORDER — ASPIRIN EC 81 MG PO TBEC
81.0000 mg | DELAYED_RELEASE_TABLET | Freq: Every day | ORAL | Status: DC
  Administered 2018-10-23: 09:00:00 81 mg via ORAL
  Filled 2018-10-23 (×2): qty 1

## 2018-10-23 MED ORDER — DILTIAZEM HCL ER COATED BEADS 360 MG PO CP24: 360.0000 mg | ORAL_CAPSULE | Freq: Every day | ORAL | 0 refills | Status: DC

## 2018-10-23 MED ORDER — TAMSULOSIN HCL 0.4 MG PO CAPS
0.4000 mg | ORAL_CAPSULE | Freq: Every day | ORAL | Status: DC
  Administered 2018-10-23: 0.4 mg via ORAL
  Filled 2018-10-23: qty 1

## 2018-10-23 MED ORDER — METFORMIN HCL 500 MG PO TABS: 500.0000 mg | ORAL_TABLET | Freq: Every day | ORAL | 0 refills | Status: DC

## 2018-10-23 MED ORDER — CITALOPRAM HYDROBROMIDE 20 MG PO TABS
20.0000 mg | ORAL_TABLET | Freq: Every day | ORAL | Status: DC
  Administered 2018-10-23: 20 mg via ORAL
  Filled 2018-10-23: qty 1

## 2018-10-23 MED ORDER — PRAVASTATIN SODIUM 40 MG PO TABS: 40.0000 mg | ORAL_TABLET | Freq: Every evening | ORAL | Status: DC

## 2018-10-23 MED ORDER — CIPROFLOXACIN HCL 500 MG PO TABS: 500.0000 mg | ORAL_TABLET | Freq: Two times a day (BID) | ORAL | 0 refills | Status: AC

## 2018-10-23 MED ORDER — FERROUS SULFATE 325 (65 FE) MG PO TABS
325.0000 mg | ORAL_TABLET | Freq: Every day | ORAL | Status: DC
  Administered 2018-10-23: 325 mg via ORAL
  Filled 2018-10-23: qty 1

## 2018-10-23 NOTE — Progress Notes (Signed)
Telemetry reviewed AFib, CVR, mostly V paced. VSS  No changes from EP perspective Follow up is in place OK  To discharge from our standpoint when ready medically otherwise  We will sign off though remain available, please recall if needed  Tommye Standard, PA-C

## 2018-10-23 NOTE — TOC Transition Note (Signed)
Transition of Care [facility]) - CM/SW Discharge Note   Patient Details  Name: [name] MRN: [id number] Date of Birth: 1940-08-12  [facility]) CM/SW Contact:  Royston Bake, RN Phone Number: 10/23/2018, 2:50 PM   Clinical Narrative:    CM talked to pt at the bedside for [facility] choices, pt chose [facility] [place]) [place]; [place] with [facility] called for arrangements. Pt stated that his daughter will provide transportation home at discharge. No further needs identified at this time.   Final next level of care: [place] Barriers to Discharge: No Barriers Identified   Patient Goals and CMS Choice Patient states their goals for this hospitalization and ongoing recovery are:: to get stronger CMS Medicare.gov Compare Post Acute Care list provided to:: Patient Choice offered to / list presented to : Patient  Discharge Placement           NA            Discharge Plan and Services In-house Referral: NA Discharge Planning Services: CM Consult                      HH Arranged: RN, Disease Management, PT, OT, Nurse's Aide [place] Agency: [place] ([facility]) Date HH Agency Contacted: 10/23/18 Time [place]: 1449 Representative spoke with at [place]: [place] ([place]) Interventions     Readmission Risk Interventions No flowsheet data found.

## 2018-10-23 NOTE — Care Management Important Message (Signed)
Important Message  Patient Details  Name: [name] MRN: [id number] Date of Birth: [birth year]   Medicare Important Message Given:  Yes     [name] 10/23/2018, 2:13 PM

## 2018-10-23 NOTE — TOC Initial Note (Signed)
Transition of Care [facility]) - Initial/Assessment Note    Patient Details  Name: [name] MRN: [id number] Date of Birth: Aug 19, 1940  Transition of Care [facility]) CM/SW Contact:    [name] Advance Care Supervisor Phone Number: [phone] 10/23/2018, 2:46 PM  Clinical Narrative:                 CM talked to patient at the bedside; Lives at home with daughter; [facility] - he doe not remember the name; has private insurance with Medicare; [facility]; pt has mail order pharmacy that deliver medication to his home. His daughter [name] takes him to all of his apts; DME - cane and walker at home; [facility] choice offered, [facility] chose [facility] [place]; [facility] with [facility] called for arrangements;   Expected Discharge Plan: [place] Services Barriers to Discharge: No Barriers Identified   Patient Goals and CMS Choice   CMS Medicare.gov Compare Post Acute Care list provided to:: Patient Choice offered to / list presented to : Patient  Expected Discharge Plan and Services Expected Discharge Plan: [place] In-house Referral: NA Discharge Planning Services: CM Consult     Expected Discharge Date: 10/23/18                         HH Arranged: RN, Disease Management, PT, OT, Nurse's Aide [place] Agency: [place] ([place]) Date HH Agency Contacted: 10/23/18 [place]: [place] Representative spoke with at [place]: [place] Arrangements/Services   Lives with:: Adult Children Patient language and need for interpreter reviewed:: No Do you feel safe going back to the place where you live?: Yes      Need for Family Participation in Patient Care: No (Comment) Care giver support system in place?: Yes (comment) Current home services: DME Criminal Activity/Legal Involvement Pertinent to Current Situation/Hospitalization: No - Comment as needed  Activities of Daily  Living Home Assistive Devices/Equipment: Walker (specify type), Cane (specify quad or straight), Grab bars in shower, Grab bars around toilet, Eyeglasses ADL Screening (condition at time of admission) Patient's cognitive ability adequate to safely complete daily activities?: No Is the patient deaf or have difficulty hearing?: Yes Does the patient have difficulty seeing, even when wearing glasses/contacts?: Yes Does the patient have difficulty concentrating, remembering, or making decisions?: No Patient able to express need for assistance with ADLs?: Yes Does the patient have difficulty dressing or bathing?: Yes Independently performs ADLs?: No Communication: Independent Dressing (OT): Needs assistance Is this a change from baseline?: Pre-admission baseline Grooming: Needs assistance Is this a change from baseline?: Pre-admission baseline Feeding: Needs assistance Is this a change from baseline?: Pre-admission baseline(can feed self with set up) Bathing: Needs assistance Is this a change from baseline?: Pre-admission baseline Toileting: Needs assistance Is this a change from baseline?: Pre-admission baseline In/Out Bed: Needs assistance Is this a change from baseline?: Change from baseline, expected to last <3 days Walks in Home: Independent with device (comment)(walker or cane) Does the patient have difficulty walking or climbing stairs?: Yes Weakness of Legs: Both Weakness of Arms/Hands: None  Permission Sought/Granted   Permission granted to share information with : Yes, Release of Information Signed  Share Information with NAME: [place] granted to share info w AGENCY: [facility]  Permission granted to share info w Relationship: Daughter  Permission granted to share info w Contact Information: Daughter  Emotional Assessment Appearance:: Well-Groomed Attitude/Demeanor/Rapport: Engaged Affect (typically observed): Accepting Orientation: : Oriented to Self,  Oriented to Place, Oriented to  Time, Oriented to Situation Alcohol / Substance Use: Never Used Psych Involvement: No (comment)  Admission diagnosis:  Atrial fibrillation, unspecified type [facility]) [I48.91] Patient Active Problem List   Diagnosis Date Noted  . Major depressive disorder, recurrent severe without psychotic features ([place]) 04/15/2018  . Depression   . Rapid atrial fibrillation ([place]) 01/18/2018  . Atrial fibrillation with RVR ([place]) 11/08/2017  . Chest pain with moderate risk of acute coronary syndrome 10/04/2017  . HTN (hypertension) 05/21/2017  . Diabetes mellitus type II 05/21/2017  . Diastolic CHF, chronic ([place]) 05/21/2017  . COPD (chronic obstructive pulmonary disease) ([place]) 05/21/2017  . H/O atrial flutter 05/21/2017  . Coronary disease/nonobstructive 05/21/2017  . Acute blood loss anemia 10/02/2016  . Abdominal pain 10/02/2016  . Constipation 10/02/2016  . Left lower quadrant pain   . Coagulopathy ([place])   . Heme positive stool   . Symptomatic anemia 07/10/2016  . Syncope 05/15/2015  . Cervical disc disease 05/15/2015  . Abnormal urinalysis 05/15/2015  . Anemia 05/15/2015  . History of CVA (cerebrovascular accident) 04/03/2014  . Near syncope 02/15/2014  . Pre-syncope 02/15/2014  . Benign neoplasm of rectum and anal canal 12/02/2013  . Upper GI bleeding 11/30/2013  . Tobacco use disorder 11/30/2013  . Anticoagulation goal of INR 2 to 3, for PAF - CHA2DS2Vasc = 7 08/15/2013  . Type 2 diabetes mellitus ([place]) 01/22/2013  . Tobacco abuse counseling 12/15/2012  . Hyperlipidemia   . Obesity (BMI 30-39.9) 12/14/2012  . Persistent atrial fibrillation ([place]) 10/07/2010  . Atrial flutter - status post CTI ablation 07/29/2010  . Essential hypertension 07/29/2010  . Chronic diastolic congestive heart failure ([place]) 07/29/2010  . Coronary artery disease, non-occlusive 07/29/2010   PCP:  Trevor Jun, FNP [id number] [place], [name] [address] Phone: [phone] Fax: [phone]     Social Determinants of Health (SDOH) Interventions    Readmission Risk Interventions No flowsheet data found.

## 2018-10-23 NOTE — Discharge Summary (Signed)
Discharge Summary  [name] SAY:[id number] DOB: Dec 14, 1940  PCP: Scot Jun, FNP  Admit date: 10/19/2018 Discharge date: 10/23/2018  Time spent: 35 minutes  Recommendations for Outpatient Follow-up:  1. Follow-up with electrophysiology 2. Follow-up with your cardiologist 3. Follow-up with your primary care provider 4. Continue physical therapy 5. Fall precautions 6. Take your medications as prescribed.  Discharge Diagnoses:  Active Hospital Problems   Diagnosis Date Noted  . Syncope 05/15/2015    Resolved Hospital Problems  No resolved problems to display.    Discharge Condition: Stable  Diet recommendation: Resume previous diet  Vitals:   10/23/18 0328 10/23/18 0840  BP: 108/65 112/63  Pulse: 70 69  Resp: 16   Temp: 97.7 F (36.5 C)   SpO2: 94%     History of present illness:  Patient is a 78 year old male with past medical history significant for COPD, GI bleed, chronic congestive heart failure (diastolic), coronary artery disease, diabetes mellitus, hypertension, hyperlipidemia, paroxysmal atrial fibrillation/flutter with tachybradycardia syndromeand pausesnot on anticoagulation due to GI bleed and arthritis. Apparently, option of permanent pacemaker placement has been broached in the past.Patient reported initially feeling well after dinner tonight, but may have passed out and was found on the floor by the daughter. Patient could not recall events leading to the fall. Significant slow A. fib was noted. The daughter called EMS and patient was transferred to the hospital. No headache, no neck pain, no fever or chills, no chest pain, no shortness of breath, no vomiting no urinary symptoms. However, patient reports nausea and vague upper abdominal discomfort. Patient reported a history of prior nephrolithiasis. Patient be admitted for further assessment and management. Cardiology team is already consulted on the patient.  ED Course:On  presentation to the emergency department, temperature was 97.5, heart rate ranges from 35 bpm to 65 bpm, respiratory rate of 21, blood pressure 118/64 mmHg and O2 sat of 95%. Labs includes hemoglobin of 10.5 g/dL, serum creatinine of 1.2 (up from 0.9), blood sugar of 236, negative rapid COVID testing, UA today suggestive of possible UTI (large leukocytosis, small hemoglobin, urine WBC of greater than 50). CT head has not shown any acute abnormalities.  Pertinent labs:Chemistry reveals sodium of 136, potassium of 4.2, chloride 104, CO2 23, BUN of 16, creatinine of 1.2 blood sugar of 236. CBC reveals WBC of 4.7, hemoglobin of 10.5, hematocrit of 34.8, MCV of 88.1 with platelet count of 226. UA is as documented above.  POD#2 post PPM placement on 10/21/18 by electrophysiology.  10/23/18: Patient was seen and examined at his bedside.  No acute events overnight.  He has no new complaints.  States his pain at site of incision is well controlled on current pain management.  Vital signs and labs reviewed and are stable.  On the day of discharge, the patient was hemodynamically stable.  He will need to follow-up with his cardiology and PCP post hospitalization.  Patient understands and agrees to plan.  Hospital Course:  Active Problems:   Syncope  Syncope likely cardiogenic with history of cardiac pauses Electrophysiology cardiology consulted and followed Received gentle IV fluid hydration. PT OT assessed and recommended home health PT OT Continue fall precautions  Post PPM placement for symptomatic tachybradycardia syndrome/paroxysmal A. fib  Post PPM insertion on 10/21/2018 per electrophysiology Continue Cardizem 360 mg daily, same as home dose If heart rate goes up add metoprolol 25 mg twice daily as recommended by electrophysiology. Not on anticoagulation due to fall risk Tramadol as needed for breakthrough pain  Suspected complicated UTI in the setting of indwelling Foley catheter UA  positive for pyuria Urine culture suggested recollection Previous urine culture grew Pseudomonas Completed 4 days of Rocephin. Start p.o. ciprofloxacin 500 mg twice daily x7 days Follow-up with your primary care provider  Chronic anxiety/depression Continue Celexa  GERD Continue Protonix 40 mg twice daily  Hyperlipidemia Continue atorvastatin  BPH Continue Flomax  Type 2 diabetes Hemoglobin A1c 7.2 on 10/02/2018 Resume metformin  Ambulatory dysfunction with falls Continue PT OT with home health Fall precautions   Code Status:Full code   Consults called:Cardiology/electrophysiology    Discharge Exam: BP 112/63 (BP Location: Right Arm)   Pulse 69   Temp 97.7 F (36.5 C) (Oral)   Resp 16   Wt 73.3 kg Comment: scale c  SpO2 94%   BMI 26.08 kg/m  . General: 78 y.o. year-old male well developed well nourished in no acute distress.  Alert and oriented x3. . Cardiovascular: Regular rate and rhythm with no rubs or gallops.  No thyromegaly or JVD noted.  Left upper chest at site of incision appears clean with no drainage. [name] Respiratory: Clear to auscultation with no wheezes or rales. Good inspiratory effort. . Abdomen: Soft nontender nondistended with normal bowel sounds x4 quadrants. . Musculoskeletal: Trace lower extremity edema. 2/4 pulses in all 4 extremities. [name] Psychiatry: Mood is appropriate for condition and setting  Discharge Instructions You were cared for by a hospitalist during your hospital stay. If you have any questions about your discharge medications or the care you received while you were in the hospital after you are discharged, you can call the unit and asked to speak with the hospitalist on call if the hospitalist that took care of you is not available. Once you are discharged, your primary care physician will handle any further medical issues. Please note that NO REFILLS for any discharge medications will be authorized once you are discharged,  as it is imperative that you return to your primary care physician (or establish a relationship with a primary care physician if you do not have one) for your aftercare needs so that they can reassess your need for medications and monitor your lab values.   Allergies as of 10/23/2018      Reactions   Cyclobenzaprine Other (See Comments)   Unsteady gait   Other Other (See Comments)   Steroids speeds his heart rate      Medication List    STOP taking these medications   diclofenac sodium 1 % Gel Commonly known as: VOLTAREN     TAKE these medications   aspirin EC 81 MG tablet Take 81 mg by mouth daily.   ciprofloxacin 500 MG tablet Commonly known as: CIPRO Take 1 tablet (500 mg total) by mouth 2 (two) times daily for 7 days. What changed: when to take this   citalopram 20 MG tablet Commonly known as: CELEXA Take 1 tablet (20 mg total) by mouth daily.   diltiazem 360 MG 24 hr capsule Commonly known as: CARDIZEM CD Take 1 capsule (360 mg total) by mouth daily.   ferrous sulfate 325 (65 FE) MG tablet Take 325 mg by mouth daily with breakfast.   hydrocortisone cream 1 % Apply 1 application topically 2 (two) times daily.   metFORMIN 500 MG tablet Commonly known as: GLUCOPHAGE Take 1 tablet (500 mg total) by mouth daily with breakfast.   nitroGLYCERIN 0.4 MG SL tablet Commonly known as: NITROSTAT Place 1 tablet (0.4 mg total) under the tongue every  5 (five) minutes x 3 doses as needed for chest pain.   pantoprazole 40 MG tablet Commonly known as: PROTONIX Take 1 tablet (40 mg total) by mouth 2 (two) times daily before a meal.   pravastatin 40 MG tablet Commonly known as: PRAVACHOL Take 1 tablet (40 mg total) by mouth every evening.   tamsulosin 0.4 MG Caps capsule Commonly known as: FLOMAX Take 1 capsule (0.4 mg total) by mouth daily.   traMADol-acetaminophen 37.5-325 MG tablet Commonly known as: ULTRACET Take 1 tablet by mouth 2 (two) times daily as needed for  severe pain.      Allergies  Allergen Reactions  . Cyclobenzaprine Other (See Comments)    Unsteady gait  . Other Other (See Comments)    Steroids speeds his heart rate   Follow-up Information    [place] [facility] Follow up.   Specialty: Cardiology Why: 11/05/2018 @ 12:00PM (noon), wound check visit Contact information: [address] [place]       Constance Haw, MD Follow up.   Specialty: Cardiology Why: 01/21/2019 @ 1:45PM Contact information: [address] [place] [address] [phone]        [facility]. Go on 10/29/2018.   Why: @9 :30am Contact information: [address]  [place] [address]       Scot Jun, FNP. Call in 1 day(s).   Specialty: Family Medicine Why: Please call for a post hospital follow-up appointment. Contact information: [place] [address] [phone]        Leonie Man, MD .   Specialty: Cardiology Contact information: [address] [place] [address] [phone]            The results of significant diagnostics from this hospitalization (including imaging, microbiology, ancillary and laboratory) are listed below for reference.    Significant Diagnostic Studies: Dg Chest 2 View  Result Date: 10/22/2018 CLINICAL DATA:  Patient status post pacemaker placement 10/21/2018. EXAM: CHEST - 2 VIEW COMPARISON:  PA and lateral chest 07/10/2018 FINDINGS: New dual lead pacing device is in place with tips of the leads in the right atrium and right ventricle. Leads are intact. Generator is over the upper left chest. Lungs are clear. No pneumothorax or pleural effusion. Emphysema noted. Heart size is normal. Aortic atherosclerosis is identified. No acute or focal bony abnormality. IMPRESSION: Pacing leads project in the right atrium and right  ventricle. Negative for pneumothorax or acute disease. Emphysema. Atherosclerosis. Electronically Signed   By: Inge Rise M.D.   On: 10/22/2018 08:55   Ct Head Wo Contrast  Result Date: 10/19/2018 CLINICAL DATA:  Head trauma.  Ataxia. EXAM: CT HEAD WITHOUT CONTRAST TECHNIQUE: Contiguous axial images were obtained from the base of the skull through the vertex without intravenous contrast. COMPARISON:  CT head dated April 14, 2018 FINDINGS: Brain: No evidence of acute infarction, hemorrhage, hydrocephalus, extra-axial collection or mass lesion/mass effect. Volume loss and chronic microvascular ischemic changes are noted. There is an old infarct in the left cerebellum. Vascular: No hyperdense vessel or unexpected calcification. Skull: Normal. Negative for fracture or focal lesion. Sinuses/Orbits: No acute finding. Other: None. IMPRESSION: No acute intracranial abnormality. Electronically Signed   By: Constance Holster M.D.   On: 10/19/2018 21:17    Microbiology: Recent Results (from the past 240 hour(s))  SARS Coronavirus 2 (CEPHEID - Performed in [place] lab), [facility]  Order     Status: None   Collection Time: 10/19/18  7:33 PM   Specimen: Nasopharyngeal Swab  Result Value Ref Range Status   SARS Coronavirus 2 NEGATIVE NEGATIVE Final    Comment: (NOTE) If result is NEGATIVE SARS-CoV-2 target nucleic acids are NOT DETECTED. The SARS-CoV-2 RNA is generally detectable in upper and lower  respiratory specimens during the acute phase of infection. The lowest  concentration of SARS-CoV-2 viral copies this assay can detect is 250  copies / mL. A negative result does not preclude SARS-CoV-2 infection  and should not be used as the sole basis for treatment or other  patient management decisions.  A negative result may occur with  improper specimen collection / handling, submission of specimen other  than nasopharyngeal swab, presence of viral mutation(s) within the  areas targeted by  this assay, and inadequate number of viral copies  (<250 copies / mL). A negative result must be combined with clinical  observations, patient history, and epidemiological information. If result is POSITIVE SARS-CoV-2 target nucleic acids are DETECTED. The SARS-CoV-2 RNA is generally detectable in upper and lower  respiratory specimens dur ing the acute phase of infection.  Positive  results are indicative of active infection with SARS-CoV-2.  Clinical  correlation with patient history and other diagnostic information is  necessary to determine patient infection status.  Positive results do  not rule out bacterial infection or co-infection with other viruses. If result is PRESUMPTIVE POSTIVE SARS-CoV-2 nucleic acids MAY BE PRESENT.   A presumptive positive result was obtained on the submitted specimen  and confirmed on repeat testing.  While 2019 novel coronavirus  (SARS-CoV-2) nucleic acids may be present in the submitted sample  additional confirmatory testing may be necessary for epidemiological  and / or clinical management purposes  to differentiate between  SARS-CoV-2 and other Sarbecovirus currently known to infect humans.  If clinically indicated additional testing with an alternate test  methodology [phone]) is advised. The SARS-CoV-2 RNA is generally  detectable in upper and lower respiratory sp ecimens during the acute  phase of infection. The expected result is Negative. Fact Sheet for Patients:  StrictlyIdeas.no Fact Sheet for Healthcare Providers: BankingDealers.co.za This test is not yet approved or cleared by the [place] FDA and has been authorized for detection and/or diagnosis of SARS-CoV-2 by FDA under an Emergency Use Authorization (EUA).  This EUA will remain in effect (meaning this test can be used) for the duration of the COVID-19 declaration under Section 564(b)(1) of the Act, 21 U.S.C. section  360bbb-3(b)(1), unless the authorization is terminated or revoked sooner. Performed at [facility], [place] [address] [place], [place] [address]   Urine culture     Status: Abnormal   Collection Time: 10/19/18  7:33 PM   Specimen: Urine, Catheterized  Result Value Ref Range Status   Specimen Description URINE, CATHETERIZED  Final   Special Requests   Final    NONE Performed at [facility], [place] [address], [place], [place] [address]    Culture MULTIPLE SPECIES PRESENT, SUGGEST RECOLLECTION (A)  Final   Report Status 10/21/2018 FINAL  Final  Surgical PCR screen     Status: None   Collection Time: 10/20/18  7:36 PM   Specimen: Nasal Mucosa; Nasal Swab  Result Value Ref Range Status   MRSA, PCR NEGATIVE NEGATIVE Final   Staphylococcus aureus NEGATIVE NEGATIVE Final    Comment: (NOTE) The Xpert SA Assay (FDA approved for NASAL specimens in  patients 60 years of age and older), is one component of a comprehensive surveillance program. It is not intended to diagnose infection nor to guide or monitor treatment. Performed at [facility], [place] [address] [place], [place] [address]      Labs: Basic Metabolic Panel: Recent Labs  Lab 10/19/18 1915 10/20/18 0731 10/22/18 0650  NA 136 138 136  K 4.2 3.9 3.7  CL 104 106 105  CO2 23 23 23   GLUCOSE 236* 151* 160*  BUN 16 13 12   CREATININE 1.20 1.05 0.96  CALCIUM 9.3 8.4* 8.5*  MG 1.7 1.9  --   PHOS  --  3.5  --    Liver Function Tests: No results for input(s): AST, ALT, ALKPHOS, BILITOT, PROT, ALBUMIN in the last 168 hours. Recent Labs  Lab 10/20/18 0731  LIPASE 38   No results for input(s): AMMONIA in the last 168 hours. CBC: Recent Labs  Lab 10/19/18 1915 10/20/18 0731 10/22/18 0650  WBC 4.7 4.8 5.5  HGB 10.5* 10.3* 11.1*  HCT 34.8* 33.0* 35.3*  MCV 88.1 85.5 85.3  PLT 226 223 210   Cardiac Enzymes: No results for input(s): CKTOTAL, CKMB, CKMBINDEX, TROPONINI in the last 168 hours. BNP: BNP  (last 3 results) Recent Labs    11/08/17 2113 04/03/18 1141  BNP 267.5* 101.9*    ProBNP (last 3 results) No results for input(s): PROBNP in the last 8760 hours.  CBG: Recent Labs  Lab 10/22/18 0637 10/22/18 1107 10/22/18 1644 10/22/18 2148 10/23/18 0621  GLUCAP 147* 171* 165* 161* 165*       Signed:  Kayleen Memos, MD [facility] 10/23/2018, 10:39 AM

## 2018-10-23 NOTE — Evaluation (Signed)
Physical Therapy Evaluation Patient Details Name: [name] MRN: [id number] DOB: Mar 18, 1941 Today's Date: 10/23/2018   History of Present Illness  Pt is a 78 y/o presenting after syncope episode and fall, daughter finding him on the floor.  S/p PPM 10/21/18 with Dr. Caryl Comes. PMH: COPD, GI bleed, CHF, CAD, DM, HTN, afib with tachybradycardia syndrome and pauses.   Clinical Impression  Patient received in bed, agrees to PT evaluation. Reports mild pain at pacemaker incision site. Reports he had been feeling quite weak at home. Requires supervision and cues for safety with mobility, increased time and use of bed rails. Patient ambulated 200 feet with RW, cues to just rest L UE on walker and not put pressure through arm. Good tolerance with ambulation. Patient will benefit from continued skilled PT to improve activity tolerance, improve strength and improve safety with mobility.    Follow Up Recommendations Home health PT    Equipment Recommendations  None recommended by PT    Recommendations for Other Services       Precautions / Restrictions Precautions Precautions: ICD/Pacemaker Precaution Comments: reviewed precautions with pt(provided handout for L UE ROM and restrictions (exit care) ) Restrictions Weight Bearing Restrictions: No      Mobility  Bed Mobility Overal bed mobility: Needs Assistance Bed Mobility: Supine to Sit;Sit to Supine Rolling: Supervision Sidelying to sit: Modified independent (Device/Increase time);Supervision Supine to sit: Supervision;Modified independent (Device/Increase time) Sit to supine: Supervision;Modified independent (Device/Increase time) Sit to sidelying: Supervision General bed mobility comments: supervision, HOB elevated and using rail with R UE; no physical assist but increased time/effort  Transfers Overall transfer level: Needs assistance Equipment used: Rolling walker (2 wheeled) Transfers: Sit to/from Stand Sit to Stand:  Supervision         General transfer comment: for safety and balance, cueing for L UE precautions   Ambulation/Gait Ambulation/Gait assistance: Min guard Gait Distance (Feet): 200 Feet Assistive device: Rolling walker (2 wheeled) Gait Pattern/deviations: Step-through pattern;Decreased stride length Gait velocity: decreased   General Gait Details: steady with ambulation using RW, cues needed to place L UE on walker, but put no weight through.  Stairs            Wheelchair Mobility    Modified Rankin (Stroke Patients Only)       Balance Overall balance assessment: Needs assistance Sitting-balance support: Feet supported Sitting balance-Leahy Scale: Good     Standing balance support: Single extremity supported;During functional activity Standing balance-Leahy Scale: Good Standing balance comment: reliant on at least 1 UE support                             Pertinent Vitals/Pain Pain Assessment: 0-10 Pain Score: 5  Pain Location: incisonal, L chest  Pain Descriptors / Indicators: Discomfort;Operative site guarding Pain Intervention(s): Monitored during session    Home Living Family/patient expects to be discharged to:: Private residence Living Arrangements: Children Available Help at Discharge: Family;Available PRN/intermittently Type of Home: Mobile home Home Access: Stairs to enter Entrance Stairs-Rails: Right;Left;Can reach both Entrance Stairs-Number of Steps: 3 Home Layout: One level Home Equipment: Walker - 4 wheels;Cane - single point;Shower seat;Grab bars - tub/shower;Grab bars - toilet Additional Comments: reports he mainly used a cane prior to admission    Prior Function Level of Independence: Independent with assistive device(s)         Comments: reports "staying in bed most the time" but uses rollator or cane for mobility, daughter assists pt with  showers, some assist with dressing, microwave meals only, daughter assist with meds       Hand Dominance   Dominant Hand: Right    Extremity/Trunk Assessment   Upper Extremity Assessment Upper Extremity Assessment: Defer to OT evaluation LUE Deficits / Details: limited due to pacemaker precautions, but distal to shoulder WFL  LUE Coordination: decreased gross motor    Lower Extremity Assessment Lower Extremity Assessment: Generalized weakness    Cervical / Trunk Assessment Cervical / Trunk Assessment: Normal  Communication   Communication: No difficulties  Cognition Arousal/Alertness: Awake/alert Behavior During Therapy: WFL for tasks assessed/performed Overall Cognitive Status: No family/caregiver present to determine baseline cognitive functioning Area of Impairment: Memory;Problem solving;Awareness                     Memory: Decreased short-term memory;Decreased recall of precautions     Awareness: Emergent Problem Solving: Requires verbal cues General Comments: patient reports "memory loss" PTA, requires min cueing for pacemaker precautions functionally      General Comments General comments (skin integrity, edema, etc.): VSS- HR 75-90    Exercises     Assessment/Plan    PT Assessment Patient needs continued PT services  PT Problem List Decreased strength;Decreased mobility;Decreased safety awareness;Decreased range of motion;Decreased knowledge of precautions;Decreased activity tolerance;Decreased balance;Pain;Decreased knowledge of use of DME       PT Treatment Interventions DME instruction;Therapeutic activities;Gait training;Therapeutic exercise;Patient/family education;Balance training;Functional mobility training    PT Goals (Current goals can be found in the Care Plan section)  Acute Rehab PT Goals Patient Stated Goal: to go home with daughter PT Goal Formulation: With patient Time For Goal Achievement: 10/25/18 Potential to Achieve Goals: Good    Frequency Min 3X/week   Barriers to discharge        Co-evaluation                AM-PAC PT "6 Clicks" Mobility  Outcome Measure Help needed turning from your back to your side while in a flat bed without using bedrails?: A Little Help needed moving from lying on your back to sitting on the side of a flat bed without using bedrails?: A Little Help needed moving to and from a bed to a chair (including a wheelchair)?: A Little Help needed standing up from a chair using your arms (e.g., wheelchair or bedside chair)?: A Little Help needed to walk in hospital room?: A Little Help needed climbing 3-5 steps with a railing? : A Little 6 Click Score: 18    End of Session Equipment Utilized During Treatment: Gait belt Activity Tolerance: Patient tolerated treatment well Patient left: in bed;with bed alarm set;with call bell/phone within reach Nurse Communication: Mobility status;Precautions PT Visit Diagnosis: Muscle weakness (generalized) (M62.81);Difficulty in walking, not elsewhere classified (R26.2);History of falling (Z91.81);Unsteadiness on feet (R26.81)    Time: 0950-1007 PT Time Calculation (min) (ACUTE ONLY): 17 min   Charges:   PT Evaluation $PT Eval Low Complexity: 1 Low PT Treatments $Gait Training: 8-22 mins        Amijah Timothy, PT, GCS 10/23/18,10:33 AM

## 2018-10-23 NOTE — Patient Instructions (Signed)
Biventricular Pacemaker Implantation, Care After This sheet gives you information about how to care for yourself after your procedure. Your health care provider may also give you more specific instructions. If you have problems or questions, contact your health care provider. What can I expect after the procedure? After the procedure, it is common to have:  Mild pain or soreness in your chest for several days.  A small amount of blood or clear fluid coming from your incision.  A slight bump in your chest where the pulse generator was placed. You may be able to feel the generator under your skin. This is normal. Follow these instructions at home: Medicines  Take over-the-counter and prescription medicines only as told by your health care provider.  Do not take any new medicines without asking your health care provider first.  If you were prescribed an antibiotic medicine, take it as told by your health care provider. Do not stop taking the antibiotic even if you start to feel better.  Ask your health care provider if the medicine prescribed to you requires you to avoid driving or using heavy machinery. Incision care      Keep your incision area clean and dry.  Avoid rubbing the incision and the area around the incision.  Follow instructions from your health care provider about how to take care of your incision. Make sure you: ? Wash your hands with soap and water before and after you change your bandage (dressing). If soap and water are not available, use hand sanitizer. ? Change your dressing as told by your health care provider. ? Leave stitches (sutures), skin glue, or adhesive strips in place. These skin closures may need to stay in place for 2 weeks or longer. If adhesive strip edges start to loosen and curl up, you may trim the loose edges. Do not remove adhesive strips completely unless your health care provider tells you to do that.  Check your incision area every day for  signs of infection. Check for: ? More redness, swelling, or pain. ? More fluid or blood. ? Warmth. ? Pus or a bad smell. Activity  Return to your normal activities as told by your health care provider. Ask your health care provider what activities are safe for you.  Do not lift anything that is heavier than 10 lb (4.5 kg), or the limit that you are told, until your health care provider says that it is safe.  Do not lift your upper arms above your shoulders for at least 6 weeks or as long as told by your health care provider. ? If you tend to sleep with your arms above your head, wear an arm restraint while you sleep to prevent this from happening. ? Avoid sudden movements that pull your upper arms far away from your body for at least 6 weeks.  Do a mild form of exercise at least once a day, such as walking. As you feel better, you may exercise more.  Gently stretch your shoulders at least once a day to help prevent stiffness in your chest.  Rest as told by your health care provider.  Avoid sitting for a long time without moving. Get up to take short walks every 1-2 hours. This is important to improve blood flow and breathing. Ask for help if you feel weak or unsteady. Electricity and magnetic fields  Avoid places and objects that have a strong electric or magnetic field. This includes: ? Airport Data processing manager. When you are at the airport,  tell officials that you have a pacemaker and show them your pacemaker identification card. Officials will check you in safely so that your device is not damaged. Do not allow magnetic wands to be waved near your pacemaker. That can make the pacemaker stop working. ? Metal detectors. If you must pass through a metal detector, walk through it quickly. Do not stop under the detector or stand near it. ? Power plants. ? Large electrical generators. ? Radiofrequency transmission towers, such as mobile phone and radio towers.  Do not use amateur Ship broker. If you are not sure whether something is safe to use, ask your health care provider. ? Some devices may be safe to use if you hold them at least 1 ft (0.3 m) from your pacemaker. These devices may include power tools, lawn mowers, and speakers.  When you talk on your mobile phone, hold it to your ear that is opposite from the side that your pacemaker is on. Do not leave your mobile phone in a pocket over your pacemaker. Long-term care  Carry your pacemaker identification card with you at all times, especially when you travel.  Consider wearing a medical alert bracelet or necklace that explains your pacemaker and any heart conditions you have.  Tell all health care providers who care for you that you have a pacemaker. This may prevent you from having an MRI because of the strong magnets used during that test.  Have your pacemaker checked every 3-6 months or as often as told by your health care provider. General instructions  Do not use any products that contain nicotine or tobacco, such as cigarettes, e-cigarettes, and chewing tobacco. These can delay incision healing after surgery. If you need help quitting, ask your health care provider.  Do not take baths, swim, or use a hot tub until your health care provider approves.  Do not wear tight clothing that could irritate the skin over your implant.  Follow instructions from your health care provider about eating or drinking restrictions.  Weigh yourself every day and write down your weight.  Keep all follow-up visits as told by your health care provider. This is important. Contact a health care provider if:  You gain 3 lb (1.4 kg) or more in 24 hours.  You have swelling in your feet, ankles, or legs.  You have an irregular heartbeat (palpitations).  You have more redness, swelling, or pain around your incision.  You have more fluid or blood coming from your incision.  Your incision area feels  warm to the touch.  You have pus or a bad smell coming from your incision. Get help right away if you:  Have chest pain.  Have a heart rate that drops below the lowest rate set for your pacemaker.  Have difficulty breathing.  Suddenly feel light-headed.  Have a fever.  Faint. These symptoms may represent a serious problem that is an emergency. Do not wait to see if the symptoms will go away. Get medical help right away. Call your local emergency services (911 in the [place] Do not drive yourself to the hospital. Summary  After the procedure, it is common to have mild pain or soreness in your chest for several days.  Take over-the-counter and prescription medicines only as told by your health care provider.  Ask your health care provider what activities are safe for you.  Carry your pacemaker identification card with you at all times, especially when you travel. This information is  not intended to replace advice given to you by your health care provider. Make sure you discuss any questions you have with your health care provider. Document Released: 12/06/2011 Document Revised: 02/11/2018 Document Reviewed: 02/11/2018 Elsevier Patient Education  2020 [employer].

## 2018-10-23 NOTE — Discharge Instructions (Signed)
° ° °  Supplemental Discharge Instructions for  Pacemaker/Defibrillator Patients  Activity No heavy lifting or vigorous activity with your left/right arm for 6 to 8 weeks.  Do not raise your left/right arm above your head for one week.  Gradually raise your affected arm as drawn below.             10/25/2018                 10/26/2018                   10/27/2018                10/28/2018 __  NO DRIVING pt reports he does not drive  WOUND CARE - Keep the wound area clean and dry.  Do not get this area wet, no showers for 24 hours; you may shower when cleared by your MD.  - The tape/steri-strips on your wound will fall off; do not pull them off.  No bandage is needed on the site.  DO  NOT apply any creams, oils, or ointments to the wound area. - If you notice any drainage or discharge from the wound, any swelling or bruising at the site, or you develop a fever > 101? F after you are discharged home, call the office at once.  Special Instructions - You are still able to use cellular telephones; use the ear opposite the side where you have your pacemaker/defibrillator.  Avoid carrying your cellular phone near your device. - When traveling through airports, show security personnel your identification card to avoid being screened in the metal detectors.  Ask the security personnel to use the hand wand. - Avoid arc welding equipment, MRI testing (magnetic resonance imaging), TENS units (transcutaneous nerve stimulators).  Call the office for questions about other devices. - Avoid electrical appliances that are in poor condition or are not properly grounded. - Microwave ovens are safe to be near or to operate.

## 2018-10-23 NOTE — Plan of Care (Signed)

## 2018-10-23 NOTE — Evaluation (Signed)
Occupational Therapy Evaluation Patient Details Name: [name] MRN: [id number] DOB: Mar 12, 1941 Today's Date: 10/23/2018    History of Present Illness Pt is a 78 y/o presenting after syncope episode and fall, daughter finding him on the floor.  S/p PPM 10/21/18 with Dr. Caryl Comes. PMH: COPD, GI bleed, CHF, CAD, DM, HTN, afib with tachybradycardia syndrome and pauses.    Clinical Impression   PTA patient reports modified independent with mobility using rollator/cane (although limited), some assist with bathing and able to dress (although difficult).  Pt admitted for above and limited by problem list below, including L UE pacemaker precautions and adherence, impaired balance, generalized weakness and impaired cognition.  He currently requires min guard for transfers, min-mod assist for LB ADLs and min assist for UB ADLs. Min cueing throughout session to adhere to precautions.  VSS during session, HR ranged from 75-90. Patient will benefit from further OT services while admitted and after dc at [facility] level in order to maximize independence, safety and precaution adherence with ADLs/mobility. Initially recommend 24/7 support.     Follow Up Recommendations  Home health OT;Supervision/Assistance - 24 hour(inital 24/7 support)    Equipment Recommendations  None recommended by OT    Recommendations for Other Services PT consult     Precautions / Restrictions Precautions Precautions: ICD/Pacemaker Precaution Comments: reviewed precautions with pt(provided handout for L UE ROM and restrictions (exit care) ) Restrictions Weight Bearing Restrictions: No      Mobility Bed Mobility Overal bed mobility: Needs Assistance Bed Mobility: Rolling;Sidelying to Sit;Sit to Sidelying Rolling: Supervision Sidelying to sit: Supervision;HOB elevated     Sit to sidelying: Supervision General bed mobility comments: supervision, HOB elevated and using rail with R UE; no physical assist but increased  time/effort  Transfers Overall transfer level: Needs assistance Equipment used: 1 person hand held assist Transfers: Sit to/from Stand Sit to Stand: Min guard         General transfer comment: for safety and balance, cueing for L UE precautions     Balance Overall balance assessment: Needs assistance Sitting-balance support: No upper extremity supported;Feet supported Sitting balance-Leahy Scale: Fair     Standing balance support: Single extremity supported;During functional activity;No upper extremity supported Standing balance-Leahy Scale: Poor Standing balance comment: reliant on at least 1 UE support                           ADL either performed or assessed with clinical judgement   ADL Overall ADL's : Needs assistance/impaired     Grooming: Min guard;Standing   Upper Body Bathing: Sitting;Minimal assistance Upper Body Bathing Details (indicate cue type and reason): d/t pacemaker precautions  Lower Body Bathing: Moderate assistance;Sit to/from stand Lower Body Bathing Details (indicate cue type and reason): decreased reach to B feet  Upper Body Dressing : Minimal assistance;Sitting Upper Body Dressing Details (indicate cue type and reason): d/t pacemaker precautions  Lower Body Dressing: Moderate assistance;Sit to/from stand Lower Body Dressing Details (indicate cue type and reason): assist for socks, unable to complete figure 4 technique- min guard sit<>stand  Toilet Transfer: Min guard;Ambulation;Grab bars;Comfort height toilet Toilet Transfer Details (indicate cue type and reason): cueing for pacemaker precautions, using R UE with grabbars  Toileting- Clothing Manipulation and Hygiene: Minimal assistance;Sit to/from stand       Functional mobility during ADLs: Min guard(hand held assist ) General ADL Comments: pt limited by balance, cognition, adherance to L UE precautions; reaching out of UE support during mobility  and provided with hand held support      Vision         Perception     Praxis      Pertinent Vitals/Pain Pain Assessment: 0-10 Pain Score: 5  Pain Location: incisonal, L chest  Pain Descriptors / Indicators: Discomfort;Operative site guarding Pain Intervention(s): Monitored during session     Hand Dominance Right   Extremity/Trunk Assessment Upper Extremity Assessment Upper Extremity Assessment: Generalized weakness;LUE deficits/detail LUE Deficits / Details: limited due to pacemaker precautions, but distal to shoulder WFL  LUE Coordination: decreased gross motor   Lower Extremity Assessment Lower Extremity Assessment: Defer to PT evaluation       Communication Communication Communication: No difficulties   Cognition Arousal/Alertness: Awake/alert Behavior During Therapy: WFL for tasks assessed/performed Overall Cognitive Status: Impaired/Different from baseline Area of Impairment: Memory;Problem solving;Awareness                     Memory: Decreased short-term memory;Decreased recall of precautions     Awareness: Emergent Problem Solving: Requires verbal cues General Comments: patient reports "memory loss" PTA, requires min cueing for pacemaker precautions functionally   General Comments  VSS- HR 75-90    Exercises     Shoulder Instructions      Home Living Family/patient expects to be discharged to:: Private residence Living Arrangements: Children Available Help at Discharge: Family;Available PRN/intermittently Type of Home: Mobile home Home Access: Stairs to enter Entrance Stairs-Number of Steps: 3 Entrance Stairs-Rails: Right;Left;Can reach both Home Layout: One level     Bathroom Shower/Tub: Teacher, early years/pre: Handicapped height     Home Equipment: Environmental consultant - 4 wheels;Cane - single point;Shower seat;Grab bars - tub/shower;Grab bars - toilet          Prior Functioning/Environment Level of Independence: Independent with assistive device(s)         Comments: reports "staying in bed most the time" but uses rollator or cane for mobility, daughter assists pt with showers, some assist with dressing, microwave meals only, daughter assist with meds         OT Problem List: Decreased strength;Decreased activity tolerance;Impaired balance (sitting and/or standing);Decreased safety awareness;Decreased knowledge of use of DME or AE;Decreased knowledge of precautions;Pain;Impaired UE functional use;Decreased cognition;Decreased coordination      OT Treatment/Interventions: Self-care/ADL training;DME and/or AE instruction;Therapeutic exercise;Cognitive remediation/compensation;Therapeutic activities;Patient/family education;Balance training    OT Goals(Current goals can be found in the care plan section) Acute Rehab OT Goals Patient Stated Goal: home today OT Goal Formulation: With patient Time For Goal Achievement: 11/06/18 Potential to Achieve Goals: Good  OT Frequency: Min 2X/week   Barriers to D/C:            Co-evaluation              AM-PAC OT "6 Clicks" Daily Activity     Outcome Measure Help from another person eating meals?: A Little Help from another person taking care of personal grooming?: A Little Help from another person toileting, which includes using toliet, bedpan, or urinal?: A Little Help from another person bathing (including washing, rinsing, drying)?: A Lot Help from another person to put on and taking off regular upper body clothing?: A Little Help from another person to put on and taking off regular lower body clothing?: A Lot 6 Click Score: 16   End of Session Equipment Utilized During Treatment: Gait belt Nurse Communication: Mobility status;Precautions  Activity Tolerance: Patient tolerated treatment well Patient left: in bed;with call bell/phone within  reach;with bed alarm set  OT Visit Diagnosis: Unsteadiness on feet (R26.81);Pain;Muscle weakness (generalized) (M62.81) Pain - Right/Left:  Left Pain - part of body: (incisional)                Time: 1017-5102 OT Time Calculation (min): 23 min Charges:  OT General Charges $OT Visit: 1 Visit OT Evaluation $OT Eval Moderate Complexity: 1 Mod OT Treatments $Self Care/Home Management : 8-22 mins  Delight Stare, [place] Pager [phone] Office [phone]   Delight Stare 10/23/2018, 9:05 AM

## 2018-10-25 ENCOUNTER — Telehealth: Payer: Self-pay

## 2018-10-25 NOTE — Telephone Encounter (Signed)
Spoke w/ pt, he is asymptomatic w/ AF.

## 2018-10-25 NOTE — Telephone Encounter (Signed)
New Message ° ° °Patient returning your call. °

## 2018-10-25 NOTE — Telephone Encounter (Signed)
LMOVM for pt to call DC back. Gave direct number. Alert received for persistent AF since implant on 7/27. Burden >99%. 5 HVR episodes, available EGMs suggest AF w/ RVR, longest duration 11 min. No OAC d/t hx of GI bleed per notes.  Presenting rhythm:

## 2018-10-29 ENCOUNTER — Ambulatory Visit (INDEPENDENT_AMBULATORY_CARE_PROVIDER_SITE_OTHER): Payer: Medicare Other | Admitting: Internal Medicine

## 2018-10-29 ENCOUNTER — Encounter: Payer: Self-pay | Admitting: Internal Medicine

## 2018-10-29 ENCOUNTER — Telehealth: Payer: Self-pay | Admitting: Family Medicine

## 2018-10-29 ENCOUNTER — Other Ambulatory Visit: Payer: Self-pay

## 2018-10-29 DIAGNOSIS — M130 Polyarthritis, unspecified: Secondary | ICD-10-CM

## 2018-10-29 DIAGNOSIS — Z09 Encounter for follow-up examination after completed treatment for conditions other than malignant neoplasm: Secondary | ICD-10-CM

## 2018-10-29 DIAGNOSIS — IMO0002 Reserved for concepts with insufficient information to code with codable children: Secondary | ICD-10-CM

## 2018-10-29 DIAGNOSIS — Z950 Presence of cardiac pacemaker: Secondary | ICD-10-CM

## 2018-10-29 DIAGNOSIS — T83511S Infection and inflammatory reaction due to indwelling urethral catheter, sequela: Secondary | ICD-10-CM

## 2018-10-29 DIAGNOSIS — N390 Urinary tract infection, site not specified: Secondary | ICD-10-CM

## 2018-10-29 DIAGNOSIS — E1142 Type 2 diabetes mellitus with diabetic polyneuropathy: Secondary | ICD-10-CM | POA: Diagnosis not present

## 2018-10-29 DIAGNOSIS — I480 Paroxysmal atrial fibrillation: Secondary | ICD-10-CM | POA: Diagnosis not present

## 2018-10-29 DIAGNOSIS — E1165 Type 2 diabetes mellitus with hyperglycemia: Secondary | ICD-10-CM | POA: Diagnosis not present

## 2018-10-29 DIAGNOSIS — R55 Syncope and collapse: Secondary | ICD-10-CM

## 2018-10-29 MED ORDER — METFORMIN HCL 500 MG PO TABS: 500.0000 mg | ORAL_TABLET | Freq: Every day | ORAL | 6 refills | Status: DC

## 2018-10-29 MED ORDER — TRAMADOL-ACETAMINOPHEN 37.5-325 MG PO TABS: 1.0000 | ORAL_TABLET | Freq: Two times a day (BID) | ORAL | 0 refills | Status: DC | PRN

## 2018-10-29 NOTE — Telephone Encounter (Signed)
Please advice  

## 2018-10-29 NOTE — Progress Notes (Signed)
Patient states that he's been feeling "fine" since discharge. Is scheduled to follow up with Cardiology in a few weeks. States that he still feels his heart racing but Cardiology has only had to call once b/c of his pacemaker readings.

## 2018-10-29 NOTE — Progress Notes (Signed)
Virtual Visit via Telephone Note Due to current restrictions/limitations of in-office visits due to the COVID-19 pandemic, this scheduled clinical appointment was converted to a telehealth visit  I connected with [name] on 10/29/18 at  9:30 AM EDT by telephone and verified that I am speaking with the correct person using two identifiers. I am in my office.  The patient is at home.  Only the patient and myself participated in this encounter.  I discussed the limitations, risks, security and privacy concerns of performing an evaluation and management service by telephone and the availability of in person appointments. I also discussed with the patient that there may be a patient responsible charge related to this service. The patient expressed understanding and agreed to proceed.   History of Present Illness: Patient with history of COPD, GI bleed, diastolic CHF, CAD, DM, HTN, HL, PAF/flutter with tachybradycardia syndrome not on anticoagulation due to GI bleed, arthritis, BPH, chronic indwelling urinary catheter, renal stones followed by urologist Dr. Gloriann Loan. Purpose of today's visit is hospital follow-up.  Patient hospitalized 7/25-29/2020 with cardiogenic syncope.  Permanent pacemaker placed.  Patient not on anticoagulation wrist.  Hospitalization complicated with suspected complicated UTI.  Treated with Rocephin and was then changed to oral ciprofloxacin.  Discharged with home PT and OT.  PAF/flutter/cardiac syncope/HTN: No further syncope No palpitations Skin over PM is healing well SOB every now and then No PND, sleeps on 2 pillows, LE edema, CP  does have a device to check BP.  Reports last reading was 130/? -limits salt in foods.  UTI: has 2-3 days of Cipro.  Taking as prescribed -Cath changed every mth by urology office.  Has had cath in placed for past several yrs -no fever.   DM:  No device to check BS.  Fingers got too sore when he did and has problems holding stripes.  +  numbness in fingers for yrs. Was on Metformin in past but was off for a while.  Thought it caused him to urinate more.  Last A1c was 7.2.  Some pain in arms, wrist, knuckles ankles for yrs.  Told he has arthritis.  Was using Voltaren gel but does not work well.  Ultracet helps which he was given in the hospital.  Requesting have some to keep on hand..  No drowsiness with the medication.       Observations/Objective: Lab Results  Component Value Date   WBC 5.5 10/22/2018   HGB 11.1 (L) 10/22/2018   HCT 35.3 (L) 10/22/2018   MCV 85.3 10/22/2018   PLT 210 10/22/2018     Chemistry      Component Value Date/Time   NA 136 10/22/2018 0650   NA 138 10/02/2018 0933   NA 139 10/20/2016 1110   K 3.7 10/22/2018 0650   K 4.0 10/20/2016 1110   CL 105 10/22/2018 0650   CO2 23 10/22/2018 0650   CO2 26 10/20/2016 1110   BUN 12 10/22/2018 0650   BUN 10 10/02/2018 0933   BUN 15.2 10/20/2016 1110   CREATININE 0.96 10/22/2018 0650   CREATININE 1.00 02/13/2018 0821   CREATININE 0.8 10/20/2016 1110      Component Value Date/Time   CALCIUM 8.5 (L) 10/22/2018 0650   CALCIUM 9.6 10/20/2016 1110   ALKPHOS 63 07/10/2018 1902   ALKPHOS 85 10/20/2016 1110   AST 15 07/10/2018 1902   AST 27 02/13/2018 0821   AST 33 10/20/2016 1110   ALT 13 07/10/2018 1902   ALT 32 02/13/2018 3244  ALT 42 10/20/2016 1110   BILITOT 0.3 07/10/2018 1902   BILITOT <0.2 (L) 02/13/2018 0821   BILITOT <0.22 10/20/2016 1110     Lab Results  Component Value Date   HGBA1C 7.2 (H) 10/02/2018     Assessment and Plan: 1. Hospital discharge follow-up 2. Cardiac syncope -Patient has permanent pacemaker.  He will keep his follow-up appointment with cardiology later this month.  3. Urinary tract infection associated with indwelling urethral catheter, sequela Followed by urology.  He will complete his course of Cipro.  4. Uncontrolled type 2 diabetes mellitus with peripheral neuropathy ([place]) Recommend that he resume  metformin taking 1 tablet a day - metFORMIN (GLUCOPHAGE) 500 MG tablet; Take 1 tablet (500 mg total) by mouth daily with breakfast.  Dispense: 30 tablet; Refill: 6  5. PAF (paroxysmal atrial fibrillation) (HCC) On diltiazem.  Not on anticoagulation due to history of GI bleed and fall risk  6. Polyarthritis On in person office visit will evaluate physical exam check for signs or symptoms to suggest any inflammatory arthritis.  Time done a refill on the Ultracet.  Advised that the medication can cause drowsiness.  [place] controlled substance reporting system reviewed. - traMADol-acetaminophen (ULTRACET) 37.5-325 MG tablet; Take 1 tablet by mouth 2 (two) times daily as needed for severe pain.  Dispense: 30 tablet; Refill: 0   Follow Up Instructions: 1-2 mths   I discussed the assessment and treatment plan with the patient. The patient was provided an opportunity to ask questions and all were answered. The patient agreed with the plan and demonstrated an understanding of the instructions.   The patient was advised to call back or seek an in-person evaluation if the symptoms worsen or if the condition fails to improve as anticipated.  I provided 16 minutes of non-face-to-face time during this encounter.   Karle Plumber, MD

## 2018-10-29 NOTE — Telephone Encounter (Signed)
Natasha from [facility] called to place verbal orders, homehealth aid twice a week for 4 weeks, 2PRN visits..please follow up

## 2018-10-30 NOTE — Telephone Encounter (Signed)
thx sk

## 2018-10-30 NOTE — Telephone Encounter (Signed)
Called Ronny Bacon to clarify verbal order request. She says that the request is for home PT. Going out two times a week for 4 weeks & 2 PRN visits.

## 2018-10-30 NOTE — Telephone Encounter (Signed)
Called [name] back to give the verbal orders.

## 2018-11-04 ENCOUNTER — Telehealth: Payer: Self-pay

## 2018-11-04 NOTE — Telephone Encounter (Signed)
    COVID-19 Pre-Screening Questions:  . In the past 7 to 10 days have you had a cough,  shortness of breath, headache, congestion, fever (100 or greater) body aches, chills, sore throat, or sudden loss of taste or sense of smell? No . Have you been around anyone with known Covid 19.No . Have you been around anyone who is awaiting Covid 19 test results in the past 7 to 10 days? No . Have you been around anyone who has been exposed to Covid 19, or has mentioned symptoms of Covid 19 within the past 7 to 10 days? No  If you have any concerns/questions about symptoms patients report during screening (either on the phone or at threshold). Contact the provider seeing the patient or DOD for further guidance.  If neither are available contact a member of the leadership team.         Pt answered no to all covid-19 prescreening questions. I asked the pt to wear a mask if he has one. I let the pt know we are reducing the number of people coming into the office and if he can physically come alone to please do so. The pt states he been having trouble balancing and will need his daughter to help him walk for his appointment. I told him his daughter can come but she will have to wear a mask. The pt verbalized understanding.

## 2018-11-05 ENCOUNTER — Ambulatory Visit (INDEPENDENT_AMBULATORY_CARE_PROVIDER_SITE_OTHER): Payer: Medicare Other | Admitting: *Deleted

## 2018-11-05 ENCOUNTER — Other Ambulatory Visit: Payer: Self-pay

## 2018-11-05 DIAGNOSIS — I4819 Other persistent atrial fibrillation: Secondary | ICD-10-CM | POA: Diagnosis not present

## 2018-11-05 LAB — CUP PACEART INCLINIC DEVICE CHECK
Battery Remaining Longevity: 116 mo
Battery Voltage: 3.07 V
Brady Statistic RA Percent Paced: 0 %
Brady Statistic RV Percent Paced: 27 %
Date Time Interrogation Session: 20200811124621
Implantable Lead Implant Date: 20200727
Implantable Lead Implant Date: 20200727
Implantable Lead Location: 753859
Implantable Lead Location: 753860
Implantable Lead Model: 5076
Implantable Lead Model: 5076
Implantable Pulse Generator Implant Date: 20200727
Lead Channel Impedance Value: 475 Ohm
Lead Channel Impedance Value: 625 Ohm
Lead Channel Pacing Threshold Amplitude: 0.75 V
Lead Channel Pacing Threshold Amplitude: 0.75 V
Lead Channel Pacing Threshold Pulse Width: 0.5 ms
Lead Channel Pacing Threshold Pulse Width: 0.5 ms
Lead Channel Sensing Intrinsic Amplitude: 12 mV
Lead Channel Sensing Intrinsic Amplitude: 3.9 mV
Lead Channel Setting Pacing Amplitude: 3.5 V
Lead Channel Setting Pacing Amplitude: 3.5 V
Lead Channel Setting Pacing Pulse Width: 0.5 ms
Lead Channel Setting Sensing Sensitivity: 2 mV
Pulse Gen Model: 2272
Pulse Gen Serial Number: 9154154

## 2018-11-05 NOTE — Progress Notes (Signed)
Wound check appointment. Steri-strips removed. Wound without redness or edema. Incision edges approximated, wound well healed. Normal device function. Thresholds, sensing, and impedances consistent with implant measurements. Device programmed at 3.5V until 3 month visit. Histogram distribution appropriate for patient and level of activity. 1 AMS entry, burden 100%. 93 high ventricular rates noted, available EGMs suggest AF w/ RVR, longest duration 1 hour. Patient educated about wound care, arm mobility, lifting restrictions. F/u w/ WC 01/21/19.

## 2018-11-07 DIAGNOSIS — R338 Other retention of urine: Secondary | ICD-10-CM | POA: Diagnosis not present

## 2018-11-13 ENCOUNTER — Emergency Department (HOSPITAL_COMMUNITY)
Admission: EM | Admit: 2018-11-13 | Discharge: 2018-11-13 | Disposition: A | Discharge status: Home / Self Care | Payer: Medicare Other | Attending: Emergency Medicine | Admitting: Emergency Medicine

## 2018-11-13 ENCOUNTER — Emergency Department (HOSPITAL_COMMUNITY): Payer: Medicare Other

## 2018-11-13 ENCOUNTER — Other Ambulatory Visit: Payer: Self-pay

## 2018-11-13 DIAGNOSIS — M109 Gout, unspecified: Secondary | ICD-10-CM | POA: Diagnosis not present

## 2018-11-13 DIAGNOSIS — I5032 Chronic diastolic (congestive) heart failure: Secondary | ICD-10-CM | POA: Insufficient documentation

## 2018-11-13 DIAGNOSIS — Z960 Presence of urogenital implants: Secondary | ICD-10-CM | POA: Diagnosis not present

## 2018-11-13 DIAGNOSIS — J449 Chronic obstructive pulmonary disease, unspecified: Secondary | ICD-10-CM | POA: Diagnosis not present

## 2018-11-13 DIAGNOSIS — E119 Type 2 diabetes mellitus without complications: Secondary | ICD-10-CM | POA: Insufficient documentation

## 2018-11-13 DIAGNOSIS — I110 Hypertensive heart disease with heart failure: Secondary | ICD-10-CM | POA: Insufficient documentation

## 2018-11-13 DIAGNOSIS — R600 Localized edema: Secondary | ICD-10-CM | POA: Diagnosis not present

## 2018-11-13 DIAGNOSIS — Z79899 Other long term (current) drug therapy: Secondary | ICD-10-CM | POA: Insufficient documentation

## 2018-11-13 DIAGNOSIS — Z7982 Long term (current) use of aspirin: Secondary | ICD-10-CM | POA: Diagnosis not present

## 2018-11-13 DIAGNOSIS — I2510 Atherosclerotic heart disease of native coronary artery without angina pectoris: Secondary | ICD-10-CM | POA: Insufficient documentation

## 2018-11-13 DIAGNOSIS — Z7984 Long term (current) use of oral hypoglycemic drugs: Secondary | ICD-10-CM | POA: Diagnosis not present

## 2018-11-13 DIAGNOSIS — M25532 Pain in left wrist: Secondary | ICD-10-CM | POA: Diagnosis present

## 2018-11-13 DIAGNOSIS — Z950 Presence of cardiac pacemaker: Secondary | ICD-10-CM | POA: Diagnosis not present

## 2018-11-13 DIAGNOSIS — R609 Edema, unspecified: Secondary | ICD-10-CM | POA: Diagnosis not present

## 2018-11-13 DIAGNOSIS — F17210 Nicotine dependence, cigarettes, uncomplicated: Secondary | ICD-10-CM | POA: Diagnosis not present

## 2018-11-13 DIAGNOSIS — R079 Chest pain, unspecified: Secondary | ICD-10-CM | POA: Diagnosis not present

## 2018-11-13 MED ORDER — PREDNISONE 10 MG (21) PO TBPK: ORAL_TABLET | ORAL | 0 refills | Status: DC

## 2018-11-13 MED ORDER — HYDROCODONE-ACETAMINOPHEN 5-325 MG PO TABS: 1.0000 | ORAL_TABLET | ORAL | 0 refills | Status: DC | PRN

## 2018-11-13 MED ORDER — HYDROMORPHONE HCL 1 MG/ML IJ SOLN
1.0000 mg | Freq: Once | INTRAMUSCULAR | Status: AC
  Administered 2018-11-13: 1 mg via INTRAMUSCULAR
  Filled 2018-11-13: qty 1

## 2018-11-13 MED ORDER — DEXAMETHASONE SODIUM PHOSPHATE 10 MG/ML IJ SOLN
10.0000 mg | Freq: Once | INTRAMUSCULAR | Status: AC
  Administered 2018-11-13: 10 mg via INTRAMUSCULAR
  Filled 2018-11-13: qty 1

## 2018-11-13 NOTE — ED Triage Notes (Signed)
Pt here from home with c/o left wrist warm and swollen and slightly red , pt has hx of arthritis

## 2018-11-13 NOTE — ED Notes (Signed)
Pt ambulating outside with wheelchair after being discharged while waiting for his daughter to pick him up. This nurse was informed by a bystander that pt was on the ground outside. Pt reports that he "slipped and fell on the ground." denies any injuries and adamantly refusing to be seen by provider. No injuries noted at this time. Pt brought back inside to continue to wait for transportation home.

## 2018-11-13 NOTE — ED Provider Notes (Signed)
[facility] Provider Note   CSN: [id number] Arrival date & time: 11/13/18  1728    History   Chief Complaint No chief complaint on file.   HPI [name] is a 78 y.o. male.     Pt presents to the ED today with left wrist pain and swelling.  He said it's been hurting for the past 10 days.  I saw him in April for the same.  No new trauma.     Past Medical History:  Diagnosis Date   Allergic rhinitis    Anemia    takes Ferrous Sulfate daily   Arthritis    "all over"   Atrial flutter ([place])    Balance problem 01/2014   CAP (community acquired pneumonia) 09/18/2014   Cervical radiculopathy due to degenerative joint disease of spine    Chronic coronary artery disease    a. 03/2010 Nonocclusive disease by cath, performed for ST elevations on ECG;  b. 06/2013 Lexi MV: EF 60%, no ischemia.   COPD (chronic obstructive pulmonary disease) (HCC)    Diabetes mellitus type II    takes Metformin and Lantus daily   Diastolic CHF, chronic ([place])    a. 12/2012 EF 55-60%, diast dysfxn, triv MR, mildly dil LA/RA.   Dyspnea    with pain   Essential hypertension    takes Diltiazem daily   Frail elderly    GERD (gastroesophageal reflux disease)    History of blood transfusion 1982   "when I had stomach OR"   History of bronchitis    1998   History of CVA (cerebrovascular accident)    History of gastric ulcer    History of kidney stones    Hypercholesterolemia    takes Pravastatin daily   Intrinsic eczema    Legally blind in left eye, as defined in [place]    Obesity    PAF (paroxysmal atrial fibrillation) ([place])    Recurrent after atrial flutter (a. 07/2010 Status post caval tricuspid isthmus ablation by Dr. Midge Aver Metoprolol daily), currently controlled on flecainide plus diltiazem and Coumadin   Urinary retention    Vitamin D deficiency    Weakness    numbness and tingling both hands    Patient Active  Problem List   Diagnosis Date Noted   Cardiac pacemaker in situ 10/29/2018   Major depressive disorder, recurrent severe without psychotic features ([place]) 04/15/2018   Depression    Atrial fibrillation with RVR ([place]) 11/08/2017   HTN (hypertension) 05/21/2017   Diabetes mellitus type II 35/36/1443   Diastolic CHF, chronic ([place]) 05/21/2017   COPD (chronic obstructive pulmonary disease) ([place]) 05/21/2017   H/O atrial flutter 05/21/2017   Abdominal pain 10/02/2016   Constipation 10/02/2016   Left lower quadrant pain    Heme positive stool    Symptomatic anemia 07/10/2016   Cardiac syncope 05/15/2015   Cervical disc disease 05/15/2015   Anemia 05/15/2015   History of CVA (cerebrovascular accident) 04/03/2014   Benign neoplasm of rectum and anal canal 12/02/2013   Upper GI bleeding 11/30/2013   Tobacco use disorder 11/30/2013   Type 2 diabetes mellitus ([place]) 01/22/2013   Hyperlipidemia    Obesity (BMI 30-39.9) 12/14/2012   Persistent atrial fibrillation ([place]) 10/07/2010   Atrial flutter - status post CTI ablation 07/29/2010   Essential hypertension 07/29/2010   Chronic diastolic congestive heart failure ([place]) 07/29/2010   Coronary artery disease, non-occlusive 07/29/2010    Past Surgical History:  Procedure Laterality Date   ATRIAL ABLATION  SURGERY  08/05/10   CTI ablation for atrial flutter by [facility]  2012   nl LV function, no occlusive CAD, PAF   CARDIOVERSION  12/07/2010    Successful direct current cardioversion with atrial fibrillation to normal sinus rhythm   CARPAL TUNNEL RELEASE Bilateral 01/30/2014   Procedure: BILATERAL CARPAL TUNNEL RELEASE;  Surgeon: Marianna Payment, MD;  Location: [facility];  Service: Orthopedics;  Laterality: Bilateral;   CATARACT EXTRACTION W/ INTRAOCULAR LENS  IMPLANT, BILATERAL Bilateral    COLONOSCOPY [name] 12/02/2013   Procedure: COLONOSCOPY;  Surgeon: Irene Shipper, MD;  Location: [facility];   Service: Endoscopy;  Laterality: [name];   ESOPHAGOGASTRODUODENOSCOPY [name] 09/22/2014   Procedure: ESOPHAGOGASTRODUODENOSCOPY (EGD);  Surgeon: Ronald Lobo, MD;  Location: [facility] ENDOSCOPY;  Service: Endoscopy;  Laterality: [name];   ESOPHAGOGASTRODUODENOSCOPY [name] 07/11/2016   Procedure: ESOPHAGOGASTRODUODENOSCOPY (EGD);  Surgeon: Doran Stabler, MD;  Location: [facility] ENDOSCOPY;  Service: Endoscopy;  Laterality: [name];   EXTRACORPOREAL SHOCK WAVE LITHOTRIPSY Right 07/15/2018   Procedure: EXTRACORPOREAL SHOCK WAVE LITHOTRIPSY (ESWL);  Surgeon: Lucas Mallow, MD;  Location: [facility];  Service: Urology;  Laterality: Right;   INCISION AND DRAINAGE ABSCESS / HEMATOMA OF BURSA / KNEE / THIGH Left 1998   knee   KNEE BURSECTOMY Left 1998   LAPAROSCOPIC CHOLECYSTECTOMY  03/2010   LEFT HEART CATH AND CORONARY ANGIOGRAPHY [name] 10/05/2017   Procedure: LEFT HEART CATH AND CORONARY ANGIOGRAPHY;  Surgeon: Martinique, Peter M, MD;  Location: [facility] LAB;  Service: Cardiovascular;  Laterality: [name];   NM MYOVIEW LTD  07/22/2013   Normal EF ~60%, no ischemia or infarction.   PACEMAKER IMPLANT [name] 10/21/2018   Procedure: PACEMAKER IMPLANT;  Surgeon: Deboraha Sprang, MD;  Location: [facility];  Service: Cardiovascular;  Laterality: [name];   PARTIAL GASTRECTOMY  1982   subtotal; "took out 30% for ulcers"   TRANSTHORACIC ECHOCARDIOGRAM  02/16/2014   EF 60%, no RWMA. - otherwise normal   YAG LASER APPLICATION Bilateral         Home Medications    Prior to Admission medications   Medication Sig Start Date End Date Taking? Authorizing Provider  aspirin EC 81 MG tablet Take 81 mg by mouth daily.    [provider]  citalopram (CELEXA) 20 MG tablet Take 1 tablet (20 mg total) by mouth daily. 10/02/18   Scot Jun, FNP  diltiazem (CARDIZEM CD) 360 MG 24 hr capsule Take 1 capsule (360 mg total) by mouth daily. 10/23/18   Kayleen Memos, DO  ferrous sulfate 325 (65 FE) MG tablet Take 325 mg by  mouth daily with breakfast.    [provider]  HYDROcodone-acetaminophen (NORCO/VICODIN) 5-325 MG tablet Take 1 tablet by mouth every 4 (four) hours as needed. 11/13/18   Isla Pence, MD  hydrocortisone cream 1 % Apply 1 application topically 2 (two) times daily. 10/02/18   Scot Jun, FNP  metFORMIN (GLUCOPHAGE) 500 MG tablet Take 1 tablet (500 mg total) by mouth daily with breakfast. 10/29/18   Ladell Pier, MD  nitroGLYCERIN (NITROSTAT) 0.4 MG SL tablet Place 1 tablet (0.4 mg total) under the tongue every 5 (five) minutes x 3 doses as needed for chest pain. 11/19/15   Erlene Quan, PA-C  pantoprazole (PROTONIX) 40 MG tablet Take 1 tablet (40 mg total) by mouth 2 (two) times daily before a meal. 10/02/18   Scot Jun, FNP  pravastatin (PRAVACHOL) 40 MG tablet Take  1 tablet (40 mg total) by mouth every evening. 10/02/18   Scot Jun, FNP  predniSONE (STERAPRED UNI-PAK 21 TAB) 10 MG (21) TBPK tablet Take 6 tabs for 2 days, then 5 for 2 days, then 4 for 2 days, then 3 for 2 days, 2 for 2 days, then 1 for 2 days 11/13/18   Isla Pence, MD  tamsulosin (FLOMAX) 0.4 MG CAPS capsule Take 1 capsule (0.4 mg total) by mouth daily. 10/02/18   Scot Jun, FNP  traMADol-acetaminophen (ULTRACET) 37.5-325 MG tablet Take 1 tablet by mouth 2 (two) times daily as needed for severe pain. 10/29/18   Ladell Pier, MD    Family History Family History  Problem Relation Age of Onset   Breast cancer Mother    Diabetes Mother    Kidney disease Mother    Heart attack Father    Heart disease Father    Heart attack Brother    Leukemia Daughter     Social History Social History   Tobacco Use   Smoking status: Current Every Day Smoker    Packs/day: 0.25    Years: 57.00    Pack years: 14.25    Types: Cigarettes   Smokeless tobacco: Never Used   Tobacco comment: He's been smoking between 0.5 and 2 ppd since age 58.  Substance Use Topics   Alcohol use:  No    Alcohol/week: 0.0 standard drinks    Comment: "quit drinking in 1986"   Drug use: No     Allergies   Cyclobenzaprine and Other   Review of Systems Review of Systems  Musculoskeletal:       Left wrist pain  All other systems reviewed and are negative.    Physical Exam Updated Vital Signs BP 115/60    Pulse 85    Temp 98 F (36.7 C)    Resp 17    SpO2 96%   Physical Exam Vitals signs and nursing note reviewed.  Constitutional:      Appearance: Normal appearance.  HENT:     Head: Normocephalic and atraumatic.     Right Ear: External ear normal.     Left Ear: External ear normal.     Nose: Nose normal.     Mouth/Throat:     Mouth: Mucous membranes are moist.     Pharynx: Oropharynx is clear.  Eyes:     Extraocular Movements: Extraocular movements intact.     Conjunctiva/sclera: Conjunctivae normal.     Pupils: Pupils are equal, round, and reactive to light.  Neck:     Musculoskeletal: Normal range of motion and neck supple.  Cardiovascular:     Rate and Rhythm: Normal rate and regular rhythm.     Pulses: Normal pulses.     Heart sounds: Normal heart sounds.  Pulmonary:     Effort: Pulmonary effort is normal.  Abdominal:     General: Abdomen is flat. Bowel sounds are normal.  Genitourinary:    Comments: Indwelling catheter Skin:    Capillary Refill: Capillary refill takes less than 2 seconds.     Comments: Left wrist redness and swelling.  Looks similar to April.  Neurological:     Mental Status: He is alert.      ED Treatments / Results  Labs (all labs ordered are listed, but only abnormal results are displayed) Labs Reviewed - No data to display  EKG None  Radiology Dg Wrist Complete Left  Result Date: 11/13/2018 CLINICAL DATA:  Ten day history of  LEFT wrist pain, edema and erythema. No known injuries. EXAM: LEFT WRIST - COMPLETE 3+ VIEW COMPARISON:  07/21/2014. FINDINGS: No evidence of acute or subacute fracture or dislocation. Cystic  changes in the lunate, capitate, cystic changes throughout the carpal bones, most prominent in the capitate bone, progressive since the prior examination in 2016. Similar subchondral cystic changes are present in the head of the second and third metacarpal. Narrowing of the trapezium-first metacarpal joint space, the scapholunate joint space and the lunate-capitate joint space. Remaining joint spaces well-preserved. Bone mineral density well-preserved. Soft tissue swelling overlying the wrist. Joint effusion suspected. IMPRESSION: No acute or subacute osseous abnormality. Possible crystalline or inflammatory arthropathy involving the LEFT wrist. Electronically Signed   By: Evangeline Dakin M.D.   On: 11/13/2018 21:28    Procedures Procedures (including critical care time)  Medications Ordered in ED Medications  HYDROmorphone (DILAUDID) injection 1 mg (1 mg Intramuscular Given 11/13/18 2205)  dexamethasone (DECADRON) injection 10 mg (10 mg Intramuscular Given 11/13/18 2204)     Initial Impression / Assessment and Plan / ED Course  I have reviewed the triage vital signs and the nursing notes.  Pertinent labs & imaging results that were available during my care of the patient were reviewed by me and considered in my medical decision making (see chart for details).     Pt likely has gout.  He will be treated with steroids and pain meds.  Final Clinical Impressions(s) / ED Diagnoses   Final diagnoses:  Acute gout of left wrist, unspecified cause    ED Discharge Orders         Ordered    HYDROcodone-acetaminophen (NORCO/VICODIN) 5-325 MG tablet  Every 4 hours PRN     11/13/18 2219    predniSONE (STERAPRED UNI-PAK 21 TAB) 10 MG (21) TBPK tablet     11/13/18 2220           Isla Pence, MD 11/13/18 2220

## 2018-11-14 ENCOUNTER — Emergency Department (HOSPITAL_COMMUNITY)
Admission: EM | Admit: 2018-11-14 | Discharge: 2018-11-15 | Disposition: A | Discharge status: Home / Self Care | Payer: Medicare Other | Attending: Emergency Medicine | Admitting: Emergency Medicine

## 2018-11-14 ENCOUNTER — Encounter (HOSPITAL_COMMUNITY): Payer: Self-pay | Admitting: Emergency Medicine

## 2018-11-14 ENCOUNTER — Emergency Department (HOSPITAL_COMMUNITY): Payer: Medicare Other

## 2018-11-14 ENCOUNTER — Other Ambulatory Visit: Payer: Self-pay

## 2018-11-14 DIAGNOSIS — J449 Chronic obstructive pulmonary disease, unspecified: Secondary | ICD-10-CM | POA: Insufficient documentation

## 2018-11-14 DIAGNOSIS — Z7982 Long term (current) use of aspirin: Secondary | ICD-10-CM | POA: Insufficient documentation

## 2018-11-14 DIAGNOSIS — R51 Headache: Secondary | ICD-10-CM | POA: Diagnosis not present

## 2018-11-14 DIAGNOSIS — W1830XA Fall on same level, unspecified, initial encounter: Secondary | ICD-10-CM | POA: Diagnosis not present

## 2018-11-14 DIAGNOSIS — I5031 Acute diastolic (congestive) heart failure: Secondary | ICD-10-CM | POA: Insufficient documentation

## 2018-11-14 DIAGNOSIS — I110 Hypertensive heart disease with heart failure: Secondary | ICD-10-CM | POA: Insufficient documentation

## 2018-11-14 DIAGNOSIS — Y92019 Unspecified place in single-family (private) house as the place of occurrence of the external cause: Secondary | ICD-10-CM | POA: Diagnosis not present

## 2018-11-14 DIAGNOSIS — Y939 Activity, unspecified: Secondary | ICD-10-CM | POA: Insufficient documentation

## 2018-11-14 DIAGNOSIS — I2510 Atherosclerotic heart disease of native coronary artery without angina pectoris: Secondary | ICD-10-CM | POA: Diagnosis not present

## 2018-11-14 DIAGNOSIS — F17210 Nicotine dependence, cigarettes, uncomplicated: Secondary | ICD-10-CM | POA: Diagnosis not present

## 2018-11-14 DIAGNOSIS — I959 Hypotension, unspecified: Secondary | ICD-10-CM | POA: Diagnosis not present

## 2018-11-14 DIAGNOSIS — Z79899 Other long term (current) drug therapy: Secondary | ICD-10-CM | POA: Diagnosis not present

## 2018-11-14 DIAGNOSIS — S0081XA Abrasion of other part of head, initial encounter: Secondary | ICD-10-CM | POA: Insufficient documentation

## 2018-11-14 DIAGNOSIS — I4891 Unspecified atrial fibrillation: Secondary | ICD-10-CM | POA: Diagnosis not present

## 2018-11-14 DIAGNOSIS — W19XXXA Unspecified fall, initial encounter: Secondary | ICD-10-CM

## 2018-11-14 DIAGNOSIS — M542 Cervicalgia: Secondary | ICD-10-CM | POA: Insufficient documentation

## 2018-11-14 DIAGNOSIS — S0990XA Unspecified injury of head, initial encounter: Secondary | ICD-10-CM | POA: Diagnosis not present

## 2018-11-14 DIAGNOSIS — E119 Type 2 diabetes mellitus without complications: Secondary | ICD-10-CM | POA: Diagnosis not present

## 2018-11-14 DIAGNOSIS — I10 Essential (primary) hypertension: Secondary | ICD-10-CM | POA: Diagnosis not present

## 2018-11-14 DIAGNOSIS — Y999 Unspecified external cause status: Secondary | ICD-10-CM | POA: Diagnosis not present

## 2018-11-14 DIAGNOSIS — T148XXA Other injury of unspecified body region, initial encounter: Secondary | ICD-10-CM

## 2018-11-14 DIAGNOSIS — S199XXA Unspecified injury of neck, initial encounter: Secondary | ICD-10-CM | POA: Diagnosis not present

## 2018-11-14 DIAGNOSIS — S299XXA Unspecified injury of thorax, initial encounter: Secondary | ICD-10-CM | POA: Diagnosis not present

## 2018-11-14 DIAGNOSIS — Z950 Presence of cardiac pacemaker: Secondary | ICD-10-CM | POA: Diagnosis not present

## 2018-11-14 DIAGNOSIS — Z794 Long term (current) use of insulin: Secondary | ICD-10-CM | POA: Insufficient documentation

## 2018-11-14 DIAGNOSIS — E1165 Type 2 diabetes mellitus with hyperglycemia: Secondary | ICD-10-CM | POA: Diagnosis not present

## 2018-11-14 DIAGNOSIS — S0031XA Abrasion of nose, initial encounter: Secondary | ICD-10-CM | POA: Insufficient documentation

## 2018-11-14 MED ORDER — HYDROCODONE-ACETAMINOPHEN 5-325 MG PO TABS
1.0000 | ORAL_TABLET | Freq: Once | ORAL | Status: AC
  Administered 2018-11-14: 1 via ORAL
  Filled 2018-11-14: qty 1

## 2018-11-14 NOTE — ED Triage Notes (Addendum)
  Pt BIB EMS after a mechanical fall at home.  Patient states he was coming back through the house from taking his medications and a chair gave way.  Fall occurred around 1625.  Patient states he jarred his neck when he fell and has carpet burn on his forehead and bilateral knees.  Patient states neck pain is 10/10 in his neck.  Patient A&O x4.

## 2018-11-14 NOTE — ED Provider Notes (Signed)
[facility] EMERGENCY DEPARTMENT Provider Note   CSN: [id number] Arrival date & time: 11/14/18  2143     History   Chief Complaint Chief Complaint  Patient presents with  . Fall    HPI [name] is a 78 y.o. male.     Patient is a 78 year old elderly male with a history of paroxysmal atrial fibrillation as well as tachybradycardia syndrome with recurrent cardiogenic syncope resulting in pacemaker placement 2 weeks ago who is not currently on anticoagulation due to GI bleed, diabetes, COPD, and chronic indwelling Foley catheter who presents today from home after a fall.  Patient states that around 430 he had taken his medicine and he was getting up to walk when he felt somewhat lightheaded and his legs started to give out on him.  He leaned over a chair but it fell backwards and he hit his head and neck.  He denies loss of consciousness and said took about 20 minutes for him to crawl to his bed.  He is denying any dizziness now and states he continues to have this issue intermittently even despite the pacemaker placement.  He denies any vision changes, chest pain, shortness of breath or leg pain.  He was seen in the emergency room yesterday because of gout but states those symptoms are starting to improve in his left wrist.  He denies any significant pain in his legs except for some small carpet burns on his right knee.  The history is provided by the patient.  Fall This is a new problem. The current episode started 3 to 5 hours ago. The problem occurs constantly. The problem has been resolved. Associated symptoms include headaches. Associated symptoms comments: Neck pain . The symptoms are aggravated by bending and twisting. The symptoms are relieved by rest. He has tried nothing for the symptoms. The treatment provided no relief.    Past Medical History:  Diagnosis Date  . Allergic rhinitis   . Anemia    takes Ferrous Sulfate daily  . Arthritis    "all over"   . Atrial flutter ([place])   . Balance problem 01/2014  . CAP (community acquired pneumonia) 09/18/2014  . Cervical radiculopathy due to degenerative joint disease of spine   . Chronic coronary artery disease    a. 03/2010 Nonocclusive disease by cath, performed for ST elevations on ECG;  b. 06/2013 Lexi MV: EF 60%, no ischemia.  [name] COPD (chronic obstructive pulmonary disease) ([place])   . Diabetes mellitus type II    takes Metformin and Lantus daily  . Diastolic CHF, chronic ([place])    a. 12/2012 EF 55-60%, diast dysfxn, triv MR, mildly dil LA/RA.  [name] Dyspnea    with pain  . Essential hypertension    takes Diltiazem daily  . Frail elderly   . GERD (gastroesophageal reflux disease)   . History of blood transfusion 1982   "when I had stomach OR"  . History of bronchitis    1998  . History of CVA (cerebrovascular accident)   . History of gastric ulcer   . History of kidney stones   . Hypercholesterolemia    takes Pravastatin daily  . Intrinsic eczema   . Legally blind in left eye, as defined in [place]   . Obesity   . PAF (paroxysmal atrial fibrillation) (HCC)    Recurrent after atrial flutter (a. 07/2010 Status post caval tricuspid isthmus ablation by Dr. Midge Aver Metoprolol daily), currently controlled on flecainide plus diltiazem and Coumadin  . Urinary  retention   . Vitamin D deficiency   . Weakness    numbness and tingling both hands    Patient Active Problem List   Diagnosis Date Noted  . Cardiac pacemaker in situ 10/29/2018  . Major depressive disorder, recurrent severe without psychotic features ([place]) 04/15/2018  . Depression   . Atrial fibrillation with RVR ([place]) 11/08/2017  . HTN (hypertension) 05/21/2017  . Diabetes mellitus type II 05/21/2017  . Diastolic CHF, chronic ([place]) 05/21/2017  . COPD (chronic obstructive pulmonary disease) ([place]) 05/21/2017  . H/O atrial flutter 05/21/2017  . Abdominal pain 10/02/2016  . Constipation 10/02/2016  . Left lower quadrant pain   .  Heme positive stool   . Symptomatic anemia 07/10/2016  . Cardiac syncope 05/15/2015  . Cervical disc disease 05/15/2015  . Anemia 05/15/2015  . History of CVA (cerebrovascular accident) 04/03/2014  . Benign neoplasm of rectum and anal canal 12/02/2013  . Upper GI bleeding 11/30/2013  . Tobacco use disorder 11/30/2013  . Type 2 diabetes mellitus ([place]) 01/22/2013  . Hyperlipidemia   . Obesity (BMI 30-39.9) 12/14/2012  . Persistent atrial fibrillation ([place]) 10/07/2010  . Atrial flutter - status post CTI ablation 07/29/2010  . Essential hypertension 07/29/2010  . Chronic diastolic congestive heart failure ([place]) 07/29/2010  . Coronary artery disease, non-occlusive 07/29/2010    Past Surgical History:  Procedure Laterality Date  . ATRIAL ABLATION SURGERY  08/05/10   CTI ablation for atrial flutter by JA  . CARDIAC CATHETERIZATION  2012   nl LV function, no occlusive CAD, PAF  . CARDIOVERSION  12/07/2010    Successful direct current cardioversion with atrial fibrillation to normal sinus rhythm  . CARPAL TUNNEL RELEASE Bilateral 01/30/2014   Procedure: BILATERAL CARPAL TUNNEL RELEASE;  Surgeon: Marianna Payment, MD;  Location: [facility];  Service: Orthopedics;  Laterality: Bilateral;  . CATARACT EXTRACTION W/ INTRAOCULAR LENS  IMPLANT, BILATERAL Bilateral   . COLONOSCOPY [name] 12/02/2013   Procedure: COLONOSCOPY;  Surgeon: Irene Shipper, MD;  Location: [facility];  Service: Endoscopy;  Laterality: [name];  . ESOPHAGOGASTRODUODENOSCOPY [name] 09/22/2014   Procedure: ESOPHAGOGASTRODUODENOSCOPY (EGD);  Surgeon: Ronald Lobo, MD;  Location: [facility] ENDOSCOPY;  Service: Endoscopy;  Laterality: [name];  . ESOPHAGOGASTRODUODENOSCOPY [name] 07/11/2016   Procedure: ESOPHAGOGASTRODUODENOSCOPY (EGD);  Surgeon: Doran Stabler, MD;  Location: [facility] ENDOSCOPY;  Service: Endoscopy;  Laterality: [name];  . EXTRACORPOREAL SHOCK WAVE LITHOTRIPSY Right 07/15/2018   Procedure: EXTRACORPOREAL SHOCK WAVE LITHOTRIPSY (ESWL);  Surgeon:  Lucas Mallow, MD;  Location: [facility];  Service: Urology;  Laterality: Right;  . INCISION AND DRAINAGE ABSCESS / HEMATOMA OF BURSA / KNEE / THIGH Left 1998   knee  . KNEE BURSECTOMY Left 1998  . LAPAROSCOPIC CHOLECYSTECTOMY  03/2010  . LEFT HEART CATH AND CORONARY ANGIOGRAPHY [name] 10/05/2017   Procedure: LEFT HEART CATH AND CORONARY ANGIOGRAPHY;  Surgeon: Martinique, Peter M, MD;  Location: [facility] CV LAB;  Service: Cardiovascular;  Laterality: [name];  . NM MYOVIEW LTD  07/22/2013   Normal EF ~60%, no ischemia or infarction.  [name] PACEMAKER IMPLANT [name] 10/21/2018   Procedure: PACEMAKER IMPLANT;  Surgeon: Deboraha Sprang, MD;  Location: [facility] CV LAB;  Service: Cardiovascular;  Laterality: [name];  . PARTIAL GASTRECTOMY  1982   subtotal; "took out 30% for ulcers"  . TRANSTHORACIC ECHOCARDIOGRAM  02/16/2014   EF 60%, no RWMA. - otherwise normal  . YAG LASER APPLICATION Bilateral         Home Medications    Prior  to Admission medications   Medication Sig Start Date End Date Taking? Authorizing Provider  aspirin EC 81 MG tablet Take 81 mg by mouth daily.    [provider]  citalopram (CELEXA) 20 MG tablet Take 1 tablet (20 mg total) by mouth daily. 10/02/18   Scot Jun, FNP  diltiazem (CARDIZEM CD) 360 MG 24 hr capsule Take 1 capsule (360 mg total) by mouth daily. 10/23/18   Kayleen Memos, DO  ferrous sulfate 325 (65 FE) MG tablet Take 325 mg by mouth daily with breakfast.    [provider]  HYDROcodone-acetaminophen (NORCO/VICODIN) 5-325 MG tablet Take 1 tablet by mouth every 4 (four) hours as needed. 11/13/18   Isla Pence, MD  hydrocortisone cream 1 % Apply 1 application topically 2 (two) times daily. 10/02/18   Scot Jun, FNP  metFORMIN (GLUCOPHAGE) 500 MG tablet Take 1 tablet (500 mg total) by mouth daily with breakfast. 10/29/18   Ladell Pier, MD  nitroGLYCERIN (NITROSTAT) 0.4 MG SL tablet Place 1 tablet (0.4 mg total) under the tongue every 5  (five) minutes x 3 doses as needed for chest pain. 11/19/15   Erlene Quan, PA-C  pantoprazole (PROTONIX) 40 MG tablet Take 1 tablet (40 mg total) by mouth 2 (two) times daily before a meal. 10/02/18   Scot Jun, FNP  pravastatin (PRAVACHOL) 40 MG tablet Take 1 tablet (40 mg total) by mouth every evening. 10/02/18   Scot Jun, FNP  predniSONE (STERAPRED UNI-PAK 21 TAB) 10 MG (21) TBPK tablet Take 6 tabs for 2 days, then 5 for 2 days, then 4 for 2 days, then 3 for 2 days, 2 for 2 days, then 1 for 2 days 11/13/18   Isla Pence, MD  tamsulosin (FLOMAX) 0.4 MG CAPS capsule Take 1 capsule (0.4 mg total) by mouth daily. 10/02/18   Scot Jun, FNP  traMADol-acetaminophen (ULTRACET) 37.5-325 MG tablet Take 1 tablet by mouth 2 (two) times daily as needed for severe pain. 10/29/18   Ladell Pier, MD    Family History Family History  Problem Relation Age of Onset  . Breast cancer Mother   . Diabetes Mother   . Kidney disease Mother   . Heart attack Father   . Heart disease Father   . Heart attack Brother   . Leukemia Daughter     Social History Social History   Tobacco Use  . Smoking status: Current Every Day Smoker    Packs/day: 0.25    Years: 57.00    Pack years: 14.25    Types: Cigarettes  . Smokeless tobacco: Never Used  . Tobacco comment: He's been smoking between 0.5 and 2 ppd since age 57.  Substance Use Topics  . Alcohol use: No    Alcohol/week: 0.0 standard drinks    Comment: "quit drinking in 1986"  . Drug use: No     Allergies   Cyclobenzaprine and Other   Review of Systems Review of Systems  Neurological: Positive for headaches.  All other systems reviewed and are negative.    Physical Exam Updated Vital Signs BP 137/68 (BP Location: Right Arm)   Pulse 79   Temp 98.1 F (36.7 C) (Oral)   Resp 18   Ht 5\' 6"  (1.676 m)   Wt 72.6 kg   SpO2 97%   BMI 25.82 kg/m   Physical Exam Vitals signs and nursing note reviewed.   Constitutional:      General: He is not in  acute distress.    Appearance: He is well-developed and normal weight.  HENT:     Head: Normocephalic and atraumatic.      Comments: Small abrasion over the end of the nose and on the forehead Eyes:     Conjunctiva/sclera: Conjunctivae normal.     Pupils: Pupils are equal, round, and reactive to light.  Neck:     Musculoskeletal: Normal range of motion and neck supple. Muscular tenderness present. No spinous process tenderness.   Cardiovascular:     Rate and Rhythm: Normal rate and regular rhythm.     Pulses: Normal pulses.     Heart sounds: No murmur.     Comments: Pacemaker present in the left upper chest.  Incision is healing well.  No overlying hematoma or swelling noted Pulmonary:     Effort: Pulmonary effort is normal. No respiratory distress.     Breath sounds: Normal breath sounds. No wheezing or rales.  Abdominal:     General: There is no distension.     Palpations: Abdomen is soft.     Tenderness: There is no abdominal tenderness. There is no guarding or rebound.  Musculoskeletal: Normal range of motion.        General: No tenderness.     Right shoulder: Normal.     Right hip: Normal.     Left hip: Normal.     Right knee: He exhibits normal range of motion, no swelling and no effusion. No tenderness found.     Left knee: Normal.     Right ankle: Normal.     Left ankle: Normal.       Legs:     Comments: Mild erythema warmth and tenderness over the left wrist but relatively good range of motion  Skin:    General: Skin is warm and dry.     Findings: No erythema or rash.  Neurological:     General: No focal deficit present.     Mental Status: He is alert and oriented to person, place, and time. Mental status is at baseline.  Psychiatric:        Mood and Affect: Mood normal.        Behavior: Behavior normal.        Thought Content: Thought content normal.      ED Treatments / Results  Labs (all labs ordered are  listed, but only abnormal results are displayed) Labs Reviewed - No data to display  EKG EKG Interpretation  Date/Time:  Thursday November 14 2018 22:32:41 EDT Ventricular Rate:  79 PR Interval:    QRS Duration: 90 QT Interval:  384 QTC Calculation: 441 R Axis:   37 Text Interpretation:  Afib/flut and V-paced complexes No further rhythm analysis attempted due to paced rhythm Anteroseptal infarct, age indeterminate Lateral leads are also involved No significant change since last tracing Confirmed by [name] [phone]) on 11/14/2018 10:46:25 PM   Radiology Dg Wrist Complete Left  Result Date: 11/13/2018 CLINICAL DATA:  Ten day history of LEFT wrist pain, edema and erythema. No known injuries. EXAM: LEFT WRIST - COMPLETE 3+ VIEW COMPARISON:  07/21/2014. FINDINGS: No evidence of acute or subacute fracture or dislocation. Cystic changes in the lunate, capitate, cystic changes throughout the carpal bones, most prominent in the capitate bone, progressive since the prior examination in 2016. Similar subchondral cystic changes are present in the head of the second and third metacarpal. Narrowing of the trapezium-first metacarpal joint space, the scapholunate joint space and the lunate-capitate joint space.  Remaining joint spaces well-preserved. Bone mineral density well-preserved. Soft tissue swelling overlying the wrist. Joint effusion suspected. IMPRESSION: No acute or subacute osseous abnormality. Possible crystalline or inflammatory arthropathy involving the LEFT wrist. Electronically Signed   By: Evangeline Dakin M.D.   On: 11/13/2018 21:28   Ct Head Wo Contrast  Result Date: 11/14/2018 CLINICAL DATA:  Mechanical fall EXAM: CT HEAD WITHOUT CONTRAST CT CERVICAL SPINE WITHOUT CONTRAST TECHNIQUE: Multidetector CT imaging of the head and cervical spine was performed following the standard protocol without intravenous contrast. Multiplanar CT image reconstructions of the cervical spine were also  generated. COMPARISON:  CT 10/19/2018, 04/14/2018, 10/19/2015 FINDINGS: CT HEAD FINDINGS Brain: No acute territorial infarction, hemorrhage or intracranial mass. Chronic infarct in the left cerebellum. Atrophy and small vessel ischemic changes of the white matter. Stable enlarged ventricles. Vascular: No hyperdense vessels.  Carotid vascular calcification Skull: Normal. Negative for fracture or focal lesion. Sinuses/Orbits: No acute finding. Other: None CT CERVICAL SPINE FINDINGS Alignment: No subluxation.  Facet alignment is within normal limits. Skull base and vertebrae: Craniovertebral junction is intact. Slight progression erosive changes involving the left lateral mass of C1, posterior aspect of the dens, and posterior vertebral body of C2. Best seen on the sagittal views, series 11 image number 29 through 33 is an obliquely oriented lucency through the dens of C2. Margins are sclerotic. Not much prevertebral edema. Soft tissues and spinal canal: Pannus formation about the C1-C2 articulation. Disc levels: Moderate diffuse degenerative change C4 through T1. Mild disc space narrowing C3-C4. multiple level facet degenerative change with multiple level bilateral foraminal stenosis. Upper chest: Negative. Other: None IMPRESSION: 1. No CT evidence for acute intracranial abnormality. Chronic infarction in the left cerebellum. Atrophy and small vessel ischemic changes of the white matter. 2. Slight progression of erosive disease involving C1 and C2. Oblique lucency through the dens of C2, best seen on sagittal views, suspect for a fracture of uncertain age, possibly chronic given densely sclerotic margins, but new since 2017 cervical CT. Critical Value/emergent results were called by telephone at the time of interpretation on 11/14/2018 at 10:59 pm to Dr. [name] , who verbally acknowledged these results. Electronically Signed   By: Donavan Foil M.D.   On: 11/14/2018 22:59    Procedures Procedures  (including critical care time)  Medications Ordered in ED Medications - No data to display   Initial Impression / Assessment and Plan / ED Course  I have reviewed the triage vital signs and the nursing notes.  Pertinent labs & imaging results that were available during my care of the patient were reviewed by me and considered in my medical decision making (see chart for details).        Patient presenting today with a fall at home.  Patient states that he felt slightly dizzy which he states usually happens 2 or 3 times a day and is usually a result of standing.  He was out using a walker today when he had stood up and states his leg gave out and he fell over the chair.  He did hit his head but within 20 minutes was able to crawl to his bed.  He is now complaining of a headache and neck discomfort.  He denies any chest pain or SOB and is not dizzy now.  We will do a chest CT of head and neck to ensure no acute injury and chest x-ray to ensure device is still in the appropriate location.  EKG shows a paced rhythm.  Patient's vital signs are otherwise within normal limits.  Patient had lab work done at the end of July without significant changes.  11:24 PM Patient's chest x-ray shows pacemaker in appropriate place without any acute change.  Patient's head CT negative for acute injury.  Cervical spine shows progression of erosive disease involving C1 and C2 with an oblique lucency through the dens of C2 best seen on the sagittal view.  They suspect a fracture of uncertain age but this is also possibly chronic.  On exam patient has no central cervical tenderness and most of his pain is located behind his right ear.  He has no numbness or weakness going down his arms and full range of motion of his neck.  Burtis Junes this to be chronic and not acute at this time.  Speaking with the patient's daughter she states he has had neck issues for years.  Also discussed with her that if he does start developing more  central pain or any type of numbness or weakness in the arms or legs he should return to the emergency room immediately.  Patient cannot have an MRI for further detailing because of his recent pacemaker placement.  Daughter will come to pick him up and pt was d/ced.  Final Clinical Impressions(s) / ED Diagnoses   Final diagnoses:  Fall, initial encounter  Abrasion    ED Discharge Orders    None       [name], MD 11/14/18 2359

## 2018-11-14 NOTE — Discharge Instructions (Signed)
No sign of head injury today and the areas where you got carpet burn can just be covered with Band-Aids and Neosporin.  X-ray shows no sign of problems with your pacemaker.  Your neck CAT scan shows a lot of arthritis but does not appear to have any definite new fracture.  However if you start getting pain in the center of your neck or any numbness or weakness going down into your arm it would be important for you to return to the emergency room or follow-up with your regular doctor quickly.  Also if the dizzy spells start getting worse or you start having passing out you should return to the emergency room or see your cardiologist.  Continue to take the pain medicine and prednisone for your gout and it should improve within the next few days.

## 2018-11-20 ENCOUNTER — Other Ambulatory Visit: Payer: Self-pay | Admitting: Family Medicine

## 2018-11-22 ENCOUNTER — Other Ambulatory Visit: Payer: Self-pay

## 2018-11-22 ENCOUNTER — Encounter (HOSPITAL_COMMUNITY): Payer: Self-pay | Admitting: Emergency Medicine

## 2018-11-22 ENCOUNTER — Emergency Department (HOSPITAL_COMMUNITY)
Admission: EM | Admit: 2018-11-22 | Discharge: 2018-11-22 | Disposition: A | Discharge status: Home / Self Care | Payer: Medicare Other | Attending: Emergency Medicine | Admitting: Emergency Medicine

## 2018-11-22 DIAGNOSIS — I5032 Chronic diastolic (congestive) heart failure: Secondary | ICD-10-CM | POA: Diagnosis not present

## 2018-11-22 DIAGNOSIS — Z8673 Personal history of transient ischemic attack (TIA), and cerebral infarction without residual deficits: Secondary | ICD-10-CM | POA: Diagnosis not present

## 2018-11-22 DIAGNOSIS — Z79899 Other long term (current) drug therapy: Secondary | ICD-10-CM | POA: Insufficient documentation

## 2018-11-22 DIAGNOSIS — Z950 Presence of cardiac pacemaker: Secondary | ICD-10-CM | POA: Insufficient documentation

## 2018-11-22 DIAGNOSIS — E1165 Type 2 diabetes mellitus with hyperglycemia: Secondary | ICD-10-CM | POA: Diagnosis not present

## 2018-11-22 DIAGNOSIS — I110 Hypertensive heart disease with heart failure: Secondary | ICD-10-CM | POA: Insufficient documentation

## 2018-11-22 DIAGNOSIS — T839XXA Unspecified complication of genitourinary prosthetic device, implant and graft, initial encounter: Secondary | ICD-10-CM | POA: Diagnosis not present

## 2018-11-22 DIAGNOSIS — J449 Chronic obstructive pulmonary disease, unspecified: Secondary | ICD-10-CM | POA: Insufficient documentation

## 2018-11-22 DIAGNOSIS — F17210 Nicotine dependence, cigarettes, uncomplicated: Secondary | ICD-10-CM | POA: Diagnosis not present

## 2018-11-22 DIAGNOSIS — Y738 Miscellaneous gastroenterology and urology devices associated with adverse incidents, not elsewhere classified: Secondary | ICD-10-CM | POA: Diagnosis not present

## 2018-11-22 DIAGNOSIS — T83091A Other mechanical complication of indwelling urethral catheter, initial encounter: Secondary | ICD-10-CM | POA: Diagnosis not present

## 2018-11-22 NOTE — ED Notes (Signed)
Pt verbally understood discharge instructions. Pt daughter came to pick him up.

## 2018-11-22 NOTE — ED Provider Notes (Signed)
[facility] EMERGENCY DEPARTMENT Provider Note   CSN: [id number] Arrival date & time: 11/22/18  1655     History   Chief Complaint No chief complaint on file.   HPI [name] is a 78 y.o. male.     HPI 78 year old male presents with concern with his Foley catheter.  He has had a catheter for quite some time and has had this problem always.  Been going on for months.  He states that normally the urine output is fine but the tube itself either kinks or gets in an abnormal position and he thinks it needs to be held into position on his leg.  He has tried tape and rope without success.  When he gets into a certain position he will have urine leak out around the catheter from his penis.  This particular catheter was placed 2 days ago. Otherwise, no complaints such as fever, hematuria, abdominal or flank pain.  Past Medical History:  Diagnosis Date  . Allergic rhinitis   . Anemia    takes Ferrous Sulfate daily  . Arthritis    "all over"  . Atrial flutter ([place])   . Balance problem 01/2014  . CAP (community acquired pneumonia) 09/18/2014  . Cervical radiculopathy due to degenerative joint disease of spine   . Chronic coronary artery disease    a. 03/2010 Nonocclusive disease by cath, performed for ST elevations on ECG;  b. 06/2013 Lexi MV: EF 60%, no ischemia.  [name] COPD (chronic obstructive pulmonary disease) ([place])   . Diabetes mellitus type II    takes Metformin and Lantus daily  . Diastolic CHF, chronic ([place])    a. 12/2012 EF 55-60%, diast dysfxn, triv MR, mildly dil LA/RA.  [name] Dyspnea    with pain  . Essential hypertension    takes Diltiazem daily  . Frail elderly   . GERD (gastroesophageal reflux disease)   . History of blood transfusion 1982   "when I had stomach OR"  . History of bronchitis    1998  . History of CVA (cerebrovascular accident)   . History of gastric ulcer   . History of kidney stones   . Hypercholesterolemia    takes Pravastatin  daily  . Intrinsic eczema   . Legally blind in left eye, as defined in [place]   . Obesity   . PAF (paroxysmal atrial fibrillation) (HCC)    Recurrent after atrial flutter (a. 07/2010 Status post caval tricuspid isthmus ablation by Dr. Midge Aver Metoprolol daily), currently controlled on flecainide plus diltiazem and Coumadin  . Urinary retention   . Vitamin D deficiency   . Weakness    numbness and tingling both hands    Patient Active Problem List   Diagnosis Date Noted  . Cardiac pacemaker in situ 10/29/2018  . Major depressive disorder, recurrent severe without psychotic features ([place]) 04/15/2018  . Depression   . Atrial fibrillation with RVR ([place]) 11/08/2017  . HTN (hypertension) 05/21/2017  . Diabetes mellitus type II 05/21/2017  . Diastolic CHF, chronic ([place]) 05/21/2017  . COPD (chronic obstructive pulmonary disease) ([place]) 05/21/2017  . H/O atrial flutter 05/21/2017  . Abdominal pain 10/02/2016  . Constipation 10/02/2016  . Left lower quadrant pain   . Heme positive stool   . Symptomatic anemia 07/10/2016  . Cardiac syncope 05/15/2015  . Cervical disc disease 05/15/2015  . Anemia 05/15/2015  . History of CVA (cerebrovascular accident) 04/03/2014  . Benign neoplasm of rectum and anal canal 12/02/2013  . Upper GI  bleeding 11/30/2013  . Tobacco use disorder 11/30/2013  . Type 2 diabetes mellitus ([place]) 01/22/2013  . Hyperlipidemia   . Obesity (BMI 30-39.9) 12/14/2012  . Persistent atrial fibrillation ([place]) 10/07/2010  . Atrial flutter - status post CTI ablation 07/29/2010  . Essential hypertension 07/29/2010  . Chronic diastolic congestive heart failure ([place]) 07/29/2010  . Coronary artery disease, non-occlusive 07/29/2010    Past Surgical History:  Procedure Laterality Date  . ATRIAL ABLATION SURGERY  08/05/10   CTI ablation for atrial flutter by JA  . CARDIAC CATHETERIZATION  2012   nl LV function, no occlusive CAD, PAF  . CARDIOVERSION  12/07/2010    Successful  direct current cardioversion with atrial fibrillation to normal sinus rhythm  . CARPAL TUNNEL RELEASE Bilateral 01/30/2014   Procedure: BILATERAL CARPAL TUNNEL RELEASE;  Surgeon: Marianna Payment, MD;  Location: [facility];  Service: Orthopedics;  Laterality: Bilateral;  . CATARACT EXTRACTION W/ INTRAOCULAR LENS  IMPLANT, BILATERAL Bilateral   . COLONOSCOPY [name] 12/02/2013   Procedure: COLONOSCOPY;  Surgeon: Irene Shipper, MD;  Location: [facility];  Service: Endoscopy;  Laterality: [name];  . ESOPHAGOGASTRODUODENOSCOPY [name] 09/22/2014   Procedure: ESOPHAGOGASTRODUODENOSCOPY (EGD);  Surgeon: Ronald Lobo, MD;  Location[facility] ENDOSCOPY;  Service: Endoscopy;  Laterality: [name];  . ESOPHAGOGASTRODUODENOSCOPY [name] 07/11/2016   Procedure: ESOPHAGOGASTRODUODENOSCOPY (EGD);  Surgeon: Doran Stabler, MD;  Location: [facility] ENDOSCOPY;  Service: Endoscopy;  Laterality: [name];  . EXTRACORPOREAL SHOCK WAVE LITHOTRIPSY Right 07/15/2018   Procedure: EXTRACORPOREAL SHOCK WAVE LITHOTRIPSY (ESWL);  Surgeon: Lucas Mallow, MD;  [facility];  Service: Urology;  Laterality: Right;  . INCISION AND DRAINAGE ABSCESS / HEMATOMA OF BURSA / KNEE / THIGH Left 1998   knee  . KNEE BURSECTOMY Left 1998  . LAPAROSCOPIC CHOLECYSTECTOMY  03/2010  . LEFT HEART CATH AND CORONARY ANGIOGRAPHY [name] 10/05/2017   Procedure: LEFT HEART CATH AND CORONARY ANGIOGRAPHY;  Surgeon: Martinique, Peter M, MD;  Location: [facility] CV LAB;  Service: Cardiovascular;  Laterality: [name];  . NM MYOVIEW LTD  07/22/2013   Normal EF ~60%, no ischemia or infarction.  [name] PACEMAKER IMPLANT [name] 10/21/2018   Procedure: PACEMAKER IMPLANT;  Surgeon: Deboraha Sprang, MD;  Location: [facility] CV LAB;  Service: Cardiovascular;  Laterality: [name];  . PARTIAL GASTRECTOMY  1982   subtotal; "took out 30% for ulcers"  . TRANSTHORACIC ECHOCARDIOGRAM  02/16/2014   EF 60%, no RWMA. - otherwise normal  . YAG LASER APPLICATION Bilateral         Home Medications    Prior to  Admission medications   Medication Sig Start Date End Date Taking? Authorizing Provider  aspirin EC 81 MG tablet Take 81 mg by mouth daily.   Yes [provider]  citalopram (CELEXA) 20 MG tablet Take 1 tablet (20 mg total) by mouth daily. 10/02/18  Yes Scot Jun, FNP  diltiazem (CARDIZEM CD) 360 MG 24 hr capsule Take 1 capsule (360 mg total) by mouth daily. 10/23/18  Yes Irene Pap N, DO  ferrous sulfate 325 (65 FE) MG tablet Take 325 mg by mouth daily with breakfast.   Yes [provider]  HYDROcodone-acetaminophen (NORCO/VICODIN) 5-325 MG tablet Take 1 tablet by mouth every 4 (four) hours as needed. 11/13/18  Yes Isla Pence, MD  nitroGLYCERIN (NITROSTAT) 0.4 MG SL tablet Place 1 tablet (0.4 mg total) under the tongue every 5 (five) minutes x 3 doses as needed for chest pain. 11/19/15  Yes Erlene Quan, PA-C  pantoprazole (PROTONIX) 40 MG tablet Take 1 tablet (40 mg total) by mouth 2 (two) times daily before a meal. 10/02/18  Yes Scot Jun, FNP  pravastatin (PRAVACHOL) 40 MG tablet Take 1 tablet (40 mg total) by mouth every evening. 10/02/18  Yes Scot Jun, FNP  tamsulosin (FLOMAX) 0.4 MG CAPS capsule Take 1 capsule (0.4 mg total) by mouth daily. 10/02/18  Yes Scot Jun, FNP  traMADol-acetaminophen (ULTRACET) 37.5-325 MG tablet Take 1 tablet by mouth 2 (two) times daily as needed for severe pain. 10/29/18  Yes Ladell Pier, MD  hydrocortisone cream 1 % APPLY TO AFFECTED AREA TWICE A DAY Patient not taking: Reported on 11/22/2018 11/20/18   Scot Jun, FNP  metFORMIN (GLUCOPHAGE) 500 MG tablet Take 1 tablet (500 mg total) by mouth daily with breakfast. Patient not taking: Reported on 11/22/2018 10/29/18   Ladell Pier, MD  predniSONE (STERAPRED UNI-PAK 21 TAB) 10 MG (21) TBPK tablet Take 6 tabs for 2 days, then 5 for 2 days, then 4 for 2 days, then 3 for 2 days, 2 for 2 days, then 1 for 2 days Patient not taking: Reported on 11/22/2018  11/13/18   Isla Pence, MD    Family History Family History  Problem Relation Age of Onset  . Breast cancer Mother   . Diabetes Mother   . Kidney disease Mother   . Heart attack Father   . Heart disease Father   . Heart attack Brother   . Leukemia Daughter     Social History Social History   Tobacco Use  . Smoking status: Current Every Day Smoker    Packs/day: 0.25    Years: 57.00    Pack years: 14.25    Types: Cigarettes  . Smokeless tobacco: Never Used  . Tobacco comment: He's been smoking between 0.5 and 2 ppd since age 65.  Substance Use Topics  . Alcohol use: No    Alcohol/week: 0.0 standard drinks    Comment: "quit drinking in 1986"  . Drug use: No     Allergies   Cyclobenzaprine and Other   Review of Systems Review of Systems  Constitutional: Negative for fever.  Gastrointestinal: Negative for abdominal pain.  Genitourinary: Negative for decreased urine volume, flank pain and hematuria.     Physical Exam Updated Vital Signs BP 140/72   Pulse 62   Temp 97.9 F (36.6 C) (Oral)   Resp 18   Ht 5\' 6"  (1.676 m)   Wt 71.7 kg   SpO2 96%   BMI 25.50 kg/m   Physical Exam Vitals signs and nursing note reviewed.  Constitutional:      General: He is not in acute distress.    Appearance: He is well-developed. He is not ill-appearing or diaphoretic.  HENT:     Head: Normocephalic and atraumatic.     Right Ear: External ear normal.     Left Ear: External ear normal.     Nose: Nose normal.  Eyes:     General:        Right eye: No discharge.        Left eye: No discharge.  Neck:     Musculoskeletal: Neck supple.  Pulmonary:     Effort: Pulmonary effort is normal.  Abdominal:     General: There is no distension.     Palpations: Abdomen is soft.     Tenderness: There is no abdominal tenderness.  Genitourinary:    Comments: Foley catheter in place.  No penile swelling/tenderness. Normal urine in bag.  Skin:    General: Skin is warm and dry.   Neurological:     Mental Status: He is alert.  Psychiatric:        Mood and Affect: Mood is not anxious.      ED Treatments / Results  Labs (all labs ordered are listed, but only abnormal results are displayed) Labs Reviewed - No data to display  EKG None  Radiology No results found.  Procedures Procedures (including critical care time)  Medications Ordered in ED Medications - No data to display   Initial Impression / Assessment and Plan / ED Course  I have reviewed the triage vital signs and the nursing notes.  Pertinent labs & imaging results that were available during my care of the patient were reviewed by me and considered in my medical decision making (see chart for details).        The nurse has applied a new foley bag with new straps. Hopefully this will help. Otherwise the catheter appears to be functioning. No other complaints. F/u with urology.  Final Clinical Impressions(s) / ED Diagnoses   Final diagnoses:  Foley catheter problem, initial encounter [facility])    ED Discharge Orders    None       Sherwood Gambler, MD 11/22/18 [phone]

## 2018-11-22 NOTE — ED Triage Notes (Signed)
Pt from home BIB EMS with c/o leaking from internal catheter off and on for year. Pt reports Leaking from insertion point. Pt reports being seen here before for similar complaint. Pt reports nothing seems to hold it in place. Pt denies pain. Pt denies other complaints. PTA VS were BP120/64, 68HR, RR18, 97 RA, T 97.7, CBG 470.

## 2018-11-23 ENCOUNTER — Encounter (HOSPITAL_COMMUNITY): Payer: Self-pay

## 2018-11-23 ENCOUNTER — Emergency Department (HOSPITAL_COMMUNITY)
Admission: EM | Admit: 2018-11-23 | Discharge: 2018-11-23 | Disposition: A | Discharge status: Home / Self Care | Payer: Medicare Other | Attending: Emergency Medicine | Admitting: Emergency Medicine

## 2018-11-23 DIAGNOSIS — I2510 Atherosclerotic heart disease of native coronary artery without angina pectoris: Secondary | ICD-10-CM | POA: Diagnosis not present

## 2018-11-23 DIAGNOSIS — Z79899 Other long term (current) drug therapy: Secondary | ICD-10-CM | POA: Diagnosis not present

## 2018-11-23 DIAGNOSIS — T83098A Other mechanical complication of other indwelling urethral catheter, initial encounter: Secondary | ICD-10-CM | POA: Diagnosis not present

## 2018-11-23 DIAGNOSIS — I110 Hypertensive heart disease with heart failure: Secondary | ICD-10-CM | POA: Insufficient documentation

## 2018-11-23 DIAGNOSIS — E119 Type 2 diabetes mellitus without complications: Secondary | ICD-10-CM | POA: Diagnosis not present

## 2018-11-23 DIAGNOSIS — I5032 Chronic diastolic (congestive) heart failure: Secondary | ICD-10-CM | POA: Insufficient documentation

## 2018-11-23 DIAGNOSIS — E1165 Type 2 diabetes mellitus with hyperglycemia: Secondary | ICD-10-CM | POA: Diagnosis not present

## 2018-11-23 DIAGNOSIS — Y69 Unspecified misadventure during surgical and medical care: Secondary | ICD-10-CM | POA: Diagnosis not present

## 2018-11-23 DIAGNOSIS — T83091A Other mechanical complication of indwelling urethral catheter, initial encounter: Secondary | ICD-10-CM | POA: Insufficient documentation

## 2018-11-23 DIAGNOSIS — F17210 Nicotine dependence, cigarettes, uncomplicated: Secondary | ICD-10-CM | POA: Diagnosis not present

## 2018-11-23 DIAGNOSIS — R109 Unspecified abdominal pain: Secondary | ICD-10-CM | POA: Diagnosis present

## 2018-11-23 DIAGNOSIS — T839XXA Unspecified complication of genitourinary prosthetic device, implant and graft, initial encounter: Secondary | ICD-10-CM

## 2018-11-23 LAB — URINALYSIS, ROUTINE W REFLEX MICROSCOPIC
Bilirubin Urine: NEGATIVE
Glucose, UA: >=500 mg/dL — AB
Hgb urine dipstick: NEGATIVE
Ketones, ur: NEGATIVE mg/dL
Nitrite: NEGATIVE
Protein, ur: NEGATIVE mg/dL
Specific Gravity, Urine: 1.012 (ref 1.005–1.030)
pH: 7 (ref 5.0–8.0)

## 2018-11-23 MED ORDER — LIDOCAINE HCL URETHRAL/MUCOSAL 2 % EX GEL
1.0000 "application " | CUTANEOUS | Status: AC
  Administered 2018-11-23: 1 via TOPICAL
  Filled 2018-11-23: qty 20

## 2018-11-23 NOTE — ED Notes (Signed)
Bladder scan 77 cc

## 2018-11-23 NOTE — ED Triage Notes (Signed)
Pt comes via [facility] for foley problem, seen here for the same yesterday, leg bag was changed states nothing has flowed since he left the ED. EMS attempted to flushed cath without success.

## 2018-11-23 NOTE — ED Notes (Signed)
Pt verbalized understanding of discharge paperwork and follow-up care.  °

## 2018-11-23 NOTE — ED Notes (Signed)
Pt has urine leaking around foley. Attempted to irrigate/aspirate for irrigation, unable, appears to have a blockage. Dr. Christy Gentles notified, new foley to be placed

## 2018-11-23 NOTE — ED Provider Notes (Signed)
[facility] EMERGENCY DEPARTMENT Provider Note   CSN: [id number] Arrival date & time: 11/23/18  [id number]     History   Chief Complaint Chief Complaint  Patient presents with  . Foley problem    HPI [name] is a 78 y.o. male.     HPI [place] patient seen yesterday in the ER for Foley catheter issue, and had his Foley catheter replaced.  There was no issues at that time.  However after going home he reports his Foley is not draining and he is having abdominal pain.  No fevers or vomiting.  No other acute problems   Past Medical History:  Diagnosis Date  . Allergic rhinitis   . Anemia    takes Ferrous Sulfate daily  . Arthritis    "all over"  . Atrial flutter ([place])   . Balance problem 01/2014  . CAP (community acquired pneumonia) 09/18/2014  . Cervical radiculopathy due to degenerative joint disease of spine   . Chronic coronary artery disease    a. 03/2010 Nonocclusive disease by cath, performed for ST elevations on ECG;  b. 06/2013 Lexi MV: EF 60%, no ischemia.  [name] COPD (chronic obstructive pulmonary disease) ([place])   . Diabetes mellitus type II    takes Metformin and Lantus daily  . Diastolic CHF, chronic ([place])    a. 12/2012 EF 55-60%, diast dysfxn, triv MR, mildly dil LA/RA.  [name] Dyspnea    with pain  . Essential hypertension    takes Diltiazem daily  . Frail elderly   . GERD (gastroesophageal reflux disease)   . History of blood transfusion 1982   "when I had stomach OR"  . History of bronchitis    1998  . History of CVA (cerebrovascular accident)   . History of gastric ulcer   . History of kidney stones   . Hypercholesterolemia    takes Pravastatin daily  . Intrinsic eczema   . Legally blind in left eye, as defined in [place]   . Obesity   . PAF (paroxysmal atrial fibrillation) (H[facility])    Recurrent after atrial flutter (a. 07/2010 Status post caval tricuspid isthmus ablation by Dr. Midge Aver Metoprolol daily), currently controlled on flecainide  plus diltiazem and Coumadin  . Urinary retention   . Vitamin D deficiency   . Weakness    numbness and tingling both hands    Patient Active Problem List   Diagnosis Date Noted  . Cardiac pacemaker in situ 10/29/2018  . Major depressive disorder, recurrent severe without psychotic features ([place]) 04/15/2018  . Depression   . Atrial fibrillation with RVR ([place]) 11/08/2017  . HTN (hypertension) 05/21/2017  . Diabetes mellitus type II 05/21/2017  . Diastolic CHF, chronic ([place]) 05/21/2017  . COPD (chronic obstructive pulmonary disease) ([place]) 05/21/2017  . H/O atrial flutter 05/21/2017  . Abdominal pain 10/02/2016  . Constipation 10/02/2016  . Left lower quadrant pain   . Heme positive stool   . Symptomatic anemia 07/10/2016  . Cardiac syncope 05/15/2015  . Cervical disc disease 05/15/2015  . Anemia 05/15/2015  . History of CVA (cerebrovascular accident) 04/03/2014  . Benign neoplasm of rectum and anal canal 12/02/2013  . Upper GI bleeding 11/30/2013  . Tobacco use disorder 11/30/2013  . Type 2 diabetes mellitus ([place]) 01/22/2013  . Hyperlipidemia   . Obesity (BMI 30-39.9) 12/14/2012  . Persistent atrial fibrillation ([place]) 10/07/2010  . Atrial flutter - status post CTI ablation 07/29/2010  . Essential hypertension 07/29/2010  . Chronic diastolic congestive heart failure (  [place]) 07/29/2010  . Coronary artery disease, non-occlusive 07/29/2010    Past Surgical History:  Procedure Laterality Date  . ATRIAL ABLATION SURGERY  08/05/10   CTI ablation for atrial flutter by JA  . CARDIAC CATHETERIZATION  2012   nl LV function, no occlusive CAD, PAF  . CARDIOVERSION  12/07/2010    Successful direct current cardioversion with atrial fibrillation to normal sinus rhythm  . CARPAL TUNNEL RELEASE Bilateral 01/30/2014   Procedure: BILATERAL CARPAL TUNNEL RELEASE;  Surgeon: Marianna Payment, MD;  Location: [facility];  Service: Orthopedics;  Laterality: Bilateral;  . CATARACT EXTRACTION W/  INTRAOCULAR LENS  IMPLANT, BILATERAL Bilateral   . COLONOSCOPY [name] 12/02/2013   Procedure: COLONOSCOPY;  Surgeon: Irene Shipper, MD;  Location: [facility];  Service: Endoscopy;  Laterality: [name];  . ESOPHAGOGASTRODUODENOSCOPY [name] 09/22/2014   Procedure: ESOPHAGOGASTRODUODENOSCOPY (EGD);  Surgeon: Ronald Lobo, MD;  [facility] ENDOSCOPY;  Service: Endoscopy;  Laterality: [name];  . ESOPHAGOGASTRODUODENOSCOPY [name] 07/11/2016   Procedure: ESOPHAGOGASTRODUODENOSCOPY (EGD);  Surgeon: Doran Stabler, MD;  Location[facility] ENDOSCOPY;  Service: Endoscopy;  Laterality: [name];  . EXTRACORPOREAL SHOCK WAVE LITHOTRIPSY Right 07/15/2018   Procedure: EXTRACORPOREAL SHOCK WAVE LITHOTRIPSY (ESWL);  Surgeon: Lucas Mallow, MD;  [facility];  Service: Urology;  Laterality: Right;  . INCISION AND DRAINAGE ABSCESS / HEMATOMA OF BURSA / KNEE / THIGH Left 1998   knee  . KNEE BURSECTOMY Left 1998  . LAPAROSCOPIC CHOLECYSTECTOMY  03/2010  . LEFT HEART CATH AND CORONARY ANGIOGRAPHY [name] 10/05/2017   Procedure: LEFT HEART CATH AND CORONARY ANGIOGRAPHY;  Surgeon: Martinique, Peter M, MD;  Location: [facility] CV LAB;  Service: Cardiovascular;  Laterality: [name];  . NM MYOVIEW LTD  07/22/2013   Normal EF ~60%, no ischemia or infarction.  [name] PACEMAKER IMPLANT [name] 10/21/2018   Procedure: PACEMAKER IMPLANT;  Surgeon: Deboraha Sprang, MD;  Location: [facility] CV LAB;  Service: Cardiovascular;  Laterality: [name];  . PARTIAL GASTRECTOMY  1982   subtotal; "took out 30% for ulcers"  . TRANSTHORACIC ECHOCARDIOGRAM  02/16/2014   EF 60%, no RWMA. - otherwise normal  . YAG LASER APPLICATION Bilateral         Home Medications    Prior to Admission medications   Medication Sig Start Date End Date Taking? Authorizing Provider  aspirin EC 81 MG tablet Take 81 mg by mouth daily.    [provider]  citalopram (CELEXA) 20 MG tablet Take 1 tablet (20 mg total) by mouth daily. 10/02/18   Scot Jun, FNP  diltiazem  (CARDIZEM CD) 360 MG 24 hr capsule Take 1 capsule (360 mg total) by mouth daily. 10/23/18   Kayleen Memos, DO  ferrous sulfate 325 (65 FE) MG tablet Take 325 mg by mouth daily with breakfast.    [provider]  HYDROcodone-acetaminophen (NORCO/VICODIN) 5-325 MG tablet Take 1 tablet by mouth every 4 (four) hours as needed. 11/13/18   Isla Pence, MD  hydrocortisone cream 1 % APPLY TO AFFECTED AREA TWICE A DAY Patient not taking: Reported on 11/22/2018 11/20/18   Scot Jun, FNP  metFORMIN (GLUCOPHAGE) 500 MG tablet Take 1 tablet (500 mg total) by mouth daily with breakfast. Patient not taking: Reported on 11/22/2018 10/29/18   Ladell Pier, MD  nitroGLYCERIN (NITROSTAT) 0.4 MG SL tablet Place 1 tablet (0.4 mg total) under the tongue every 5 (five) minutes x 3 doses as needed for chest pain. 11/19/15   Erlene Quan, PA-C  pantoprazole (PROTONIX) 40 MG tablet Take 1 tablet (40 mg total) by mouth 2 (two) times daily before a meal. 10/02/18   Scot Jun, FNP  pravastatin (PRAVACHOL) 40 MG tablet Take 1 tablet (40 mg total) by mouth every evening. 10/02/18   Scot Jun, FNP  predniSONE (STERAPRED UNI-PAK 21 TAB) 10 MG (21) TBPK tablet Take 6 tabs for 2 days, then 5 for 2 days, then 4 for 2 days, then 3 for 2 days, 2 for 2 days, then 1 for 2 days Patient not taking: Reported on 11/22/2018 11/13/18   Isla Pence, MD  tamsulosin (FLOMAX) 0.4 MG CAPS capsule Take 1 capsule (0.4 mg total) by mouth daily. 10/02/18   Scot Jun, FNP  traMADol-acetaminophen (ULTRACET) 37.5-325 MG tablet Take 1 tablet by mouth 2 (two) times daily as needed for severe pain. 10/29/18   Ladell Pier, MD    Family History Family History  Problem Relation Age of Onset  . Breast cancer Mother   . Diabetes Mother   . Kidney disease Mother   . Heart attack Father   . Heart disease Father   . Heart attack Brother   . Leukemia Daughter     Social History Social History   Tobacco  Use  . Smoking status: Current Every Day Smoker    Packs/day: 0.25    Years: 57.00    Pack years: 14.25    Types: Cigarettes  . Smokeless tobacco: Never Used  . Tobacco comment: He's been smoking between 0.5 and 2 ppd since age 32.  Substance Use Topics  . Alcohol use: No    Alcohol/week: 0.0 standard drinks    Comment: "quit drinking in 1986"  . Drug use: No     Allergies   Cyclobenzaprine and Other   Review of Systems Review of Systems  Constitutional: Negative for fever.  Gastrointestinal: Negative for vomiting.     Physical Exam Updated Vital Signs BP (!) 103/53   Pulse 64   Temp 98 F (36.7 C) (Oral)   Resp 18   SpO2 96%   Physical Exam CONSTITUTIONAL: Elderly, no acute distress HEAD: Normocephalic/atraumatic EYES: EOMI NECK: supple no meningeal signs CV: S1/S2 noted, no murmurs/rubs/gallops noted LUNGS: Lungs are clear to auscultation bilaterally, no apparent distress ABDOMEN: soft, mild suprapubic tenderness, no rebound or guarding, bowel sounds noted throughout abdomen GU: Catheter in place, no signs of trauma, Foley bag appears empty NEURO: Pt is awake/alert/appropriate, moves all extremitiesx4.  No facial droop.   EXTREMITIES: full ROM SKIN: warm, color normal PSYCH: no abnormalities of mood noted, alert and oriented to situation   ED Treatments / Results  Labs (all labs ordered are listed, but only abnormal results are displayed) Labs Reviewed  URINALYSIS, ROUTINE W REFLEX MICROSCOPIC - Abnormal; Notable for the following components:      Result Value   Glucose, UA >=500 (*)    Leukocytes,Ua MODERATE (*)    Bacteria, UA RARE (*)    All other components within normal limits  URINE CULTURE    EKG None  Radiology No results found.  Procedures Procedures (including critical care time)  Medications Ordered in ED Medications - No data to display   Initial Impression / Assessment and Plan / ED Course  I have reviewed the triage vital  signs and the nursing notes.      Plan to replace Foley 7:32 AM] Foley was replaced and the patient improved.  Will send urine culture, but will not start antibiotics  at this time.  Patient is otherwise appropriate for discharge home Final Clinical Impressions(s) / ED Diagnoses   Final diagnoses:  Complication of Foley catheter, initial encounter [facility])    ED Discharge Orders    None       Ripley Fraise, MD 11/23/18 [phone]

## 2018-11-24 LAB — URINE CULTURE

## 2018-11-25 DIAGNOSIS — E1165 Type 2 diabetes mellitus with hyperglycemia: Secondary | ICD-10-CM | POA: Diagnosis not present

## 2018-11-25 DIAGNOSIS — R358 Other polyuria: Secondary | ICD-10-CM | POA: Diagnosis not present

## 2018-11-25 DIAGNOSIS — R739 Hyperglycemia, unspecified: Secondary | ICD-10-CM | POA: Diagnosis not present

## 2018-11-25 DIAGNOSIS — I491 Atrial premature depolarization: Secondary | ICD-10-CM | POA: Diagnosis not present

## 2018-11-25 DIAGNOSIS — I959 Hypotension, unspecified: Secondary | ICD-10-CM | POA: Diagnosis not present

## 2018-11-25 DIAGNOSIS — I4892 Unspecified atrial flutter: Secondary | ICD-10-CM | POA: Diagnosis not present

## 2018-11-25 DIAGNOSIS — I440 Atrioventricular block, first degree: Secondary | ICD-10-CM | POA: Diagnosis not present

## 2018-11-25 DIAGNOSIS — I2119 ST elevation (STEMI) myocardial infarction involving other coronary artery of inferior wall: Secondary | ICD-10-CM | POA: Diagnosis not present

## 2018-11-25 DIAGNOSIS — Z7984 Long term (current) use of oral hypoglycemic drugs: Secondary | ICD-10-CM | POA: Diagnosis not present

## 2018-12-04 ENCOUNTER — Other Ambulatory Visit: Payer: Self-pay | Admitting: Nurse Practitioner

## 2018-12-04 ENCOUNTER — Ambulatory Visit (INDEPENDENT_AMBULATORY_CARE_PROVIDER_SITE_OTHER): Payer: Medicare Other | Admitting: Nurse Practitioner

## 2018-12-04 DIAGNOSIS — Z09 Encounter for follow-up examination after completed treatment for conditions other than malignant neoplasm: Secondary | ICD-10-CM

## 2018-12-04 DIAGNOSIS — E1100 Type 2 diabetes mellitus with hyperosmolarity without nonketotic hyperglycemic-hyperosmolar coma (NKHHC): Secondary | ICD-10-CM

## 2018-12-04 MED ORDER — GLIMEPIRIDE 2 MG PO TABS: 2.0000 mg | ORAL_TABLET | Freq: Every day | ORAL | 3 refills | Status: DC

## 2018-12-04 MED ORDER — MISC. DEVICES MISC: 0 refills | Status: DC

## 2018-12-04 NOTE — Progress Notes (Signed)
Virtual Visit via Telephone Note Due to national recommendations of social distancing due to [place] 19, telehealth visit is felt to be most appropriate for this patient at this time.  I discussed the limitations, risks, security and privacy concerns of performing an evaluation and management service by telephone and the availability of in person appointments. I also discussed with the patient that there may be a patient responsible charge related to this service. The patient expressed understanding and agreed to proceed.    I connected with [name] on 12/04/18  at   3:30 PM EDT  EDT by telephone and verified that I am speaking with the correct person using two identifiers.   Consent I discussed the limitations, risks, security and privacy concerns of performing an evaluation and management service by telephone and the availability of in person appointments. I also discussed with the patient that there may be a patient responsible charge related to this service. The patient expressed understanding and agreed to proceed.   Location of Patient: Private Residence    Location of Provider: [place] and [employer] Office    Persons participating in Telemedicine visit: Geryl Rankins FNP-BC [place]    History of Present Illness: Telemedicine visit for: HFU  has a past medical history of Allergic rhinitis, Anemia, Arthritis, Atrial flutter ([place]), Balance problem (01/2014), CAP (community acquired pneumonia) (09/18/2014), Cervical radiculopathy due to degenerative joint disease of spine, Chronic coronary artery disease, COPD (chronic obstructive pulmonary disease) ([place]), Diabetes mellitus type II, Diastolic CHF, chronic ([place]), Dyspnea, Essential hypertension, Frail elderly, GERD (gastroesophageal reflux disease), History of blood transfusion (1982), History of bronchitis, History of CVA (cerebrovascular accident), History of gastric ulcer, History of kidney stones,  Hypercholesterolemia, Intrinsic eczema, Legally blind in left eye, as defined in [place], Obesity, PAF (paroxysmal atrial fibrillation) ([place]), Urinary retention, Vitamin D deficiency, and Weakness.    He was evaluated in the emergency room on 8-28, 8-29 and 8-31 for hyperglycemia and Foley catheter issues. On 8-28 patient reported possible kink in his catheter. The nurse applied a new foley bag with new straps.  Per ED provider the Foley catheter appeared to be functioning and patient was discharged with recommendations to f/u with urology.    He returned the next day to the emergency room on August 29 stating his Foley was not draining and he was experiencing abdominal pain with no fever or vomiting.  His Foley was actually replaced on that ED visit and urine culture was obtained.  On August 31 patient presented to the ED plaints of his urine leg bag feeling often and concerned that his blood sugars had been elevated for 2 to 3 weeks despite have not having a glucometer to check.  In the ED patient was noted for hyperglycemia with blood glucose 387 and was treated with SQ insulin and IV fluids.  It was felt that his elevated blood glucose was related to noncompliance.    Today patient also states he has not been taking his metformin due to diarrhea.  He states he tried to take it for 3 days and was not continue due to significant GI issues.  Will discontinue the current 500 mg metformin dose.  Last A1c 7.2.  [facility] appointment [facility] at 9 AM.  He states he does not have transportation.  I have left a message for his daughter regarding his appointment time and requesting if possible that she provide transportation for Mr. Eberlein.  He would  also like to speak to our nurse case manager regarding placement to SNF or ALF.  He states he lives alone and has difficult time getting around due to weakness.  States "I cannot take care of myself anymore". He does endorse recent falls.  History of paroxysmal atrial fibrillation as well as tachybradycardia syndrome with recurrent cardiogenic syncope resulting in pacemaker placement 4 weeks ago who is not currently on anticoagulation due to GI bleed.    Past Medical History:  Diagnosis Date  . Allergic rhinitis   . Anemia    takes Ferrous Sulfate daily  . Arthritis    "all over"  . Atrial flutter ([place])   . Balance problem 01/2014  . CAP (community acquired pneumonia) 09/18/2014  . Cervical radiculopathy due to degenerative joint disease of spine   . Chronic coronary artery disease    a. 03/2010 Nonocclusive disease by cath, performed for ST elevations on ECG;  b. 06/2013 Lexi MV: EF 60%, no ischemia.  [name] COPD (chronic obstructive pulmonary disease) ([place])   . Diabetes mellitus type II    takes Metformin and Lantus daily  . Diastolic CHF, chronic ([place])    a. 12/2012 EF 55-60%, diast dysfxn, triv MR, mildly dil LA/RA.  [name] Dyspnea    with pain  . Essential hypertension    takes Diltiazem daily  . Frail elderly   . GERD (gastroesophageal reflux disease)   . History of blood transfusion 1982   "when I had stomach OR"  . History of bronchitis    1998  . History of CVA (cerebrovascular accident)   . History of gastric ulcer   . History of kidney stones   . Hypercholesterolemia    takes Pravastatin daily  . Intrinsic eczema   . Legally blind in left eye, as defined in [place]   . Obesity   . PAF (paroxysmal atrial fibrillation) ([facility])    Recurrent after atrial flutter (a. 07/2010 Status post caval tricuspid isthmus ablation by Dr. Midge Aver Metoprolol daily), currently controlled on flecainide plus diltiazem and Coumadin  . Urinary retention   . Vitamin D deficiency   . Weakness    numbness and tingling both hands    Past Surgical History:  Procedure Laterality Date  . ATRIAL ABLATION SURGERY  08/05/10   CTI ablation for atrial flutter by JA  . CARDIAC CATHETERIZATION  2012   nl LV function, no occlusive CAD, PAF  .  CARDIOVERSION  12/07/2010    Successful direct current cardioversion with atrial fibrillation to normal sinus rhythm  . CARPAL TUNNEL RELEASE Bilateral 01/30/2014   Procedure: BILATERAL CARPAL TUNNEL RELEASE;  Surgeon: Marianna Payment, MD;  Location: [facility];  Service: Orthopedics;  Laterality: Bilateral;  . CATARACT EXTRACTION W/ INTRAOCULAR LENS  IMPLANT, BILATERAL Bilateral   . COLONOSCOPY [name] 12/02/2013   Procedure: COLONOSCOPY;  Surgeon: Irene Shipper, MD;  Location: [facility];  Service: Endoscopy;  Laterality: [name];  . ESOPHAGOGASTRODUODENOSCOPY [name] 09/22/2014   Procedure: ESOPHAGOGASTRODUODENOSCOPY (EGD);  Surgeon: Ronald Lobo, MD;  Location: [facility] ENDOSCOPY;  Service: Endoscopy;  Laterality: [name];  . ESOPHAGOGASTRODUODENOSCOPY [name] 07/11/2016   Procedure: ESOPHAGOGASTRODUODENOSCOPY (EGD);  Surgeon: Doran Stabler, MD;  Location: [facility] ENDOSCOPY;  Service: Endoscopy;  Laterality: [name];  . EXTRACORPOREAL SHOCK WAVE LITHOTRIPSY Right 07/15/2018   Procedure: EXTRACORPOREAL SHOCK WAVE LITHOTRIPSY (ESWL);  Surgeon: Lucas Mallow, MD;  [facility];  Service: Urology;  Laterality: Right;  . INCISION AND DRAINAGE ABSCESS / HEMATOMA OF BURSA / KNEE / THIGH  Left 1998   knee  . KNEE BURSECTOMY Left 1998  . LAPAROSCOPIC CHOLECYSTECTOMY  03/2010  . LEFT HEART CATH AND CORONARY ANGIOGRAPHY [name] 10/05/2017   Procedure: LEFT HEART CATH AND CORONARY ANGIOGRAPHY;  Surgeon: Martinique, Peter M, MD;  Location: [facility];  Service: Cardiovascular;  Laterality: [name];  . NM MYOVIEW LTD  07/22/2013   Normal EF ~60%, no ischemia or infarction.  [name] PACEMAKER IMPLANT [name] 10/21/2018   Procedure: PACEMAKER IMPLANT;  Surgeon: Deboraha Sprang, MD;  Location: [facility];  Service: Cardiovascular;  Laterality: [name];  . PARTIAL GASTRECTOMY  1982   subtotal; "took out 30% for ulcers"  . TRANSTHORACIC ECHOCARDIOGRAM  02/16/2014   EF 60%, no RWMA. - otherwise normal  . YAG LASER APPLICATION Bilateral      Family History  Problem Relation Age of Onset  . Breast cancer Mother   . Diabetes Mother   . Kidney disease Mother   . Heart attack Father   . Heart disease Father   . Heart attack Brother   . Leukemia Daughter     Social History   Socioeconomic History  . Marital status: Widowed    Spouse name: Not on file  . Number of children: Not on file  . Years of education: Not on file  . Highest education level: Not on file  Occupational History  . Occupation: Retired  Scientific laboratory technician  . Financial resource strain: Not on file  . Food insecurity    Worry: Not on file    Inability: Not on file  . Transportation needs    Medical: Not on file    Non-medical: Not on file  Tobacco Use  . Smoking status: Current Every Day Smoker    Packs/day: 0.25    Years: 57.00    Pack years: 14.25    Types: Cigarettes  . Smokeless tobacco: Never Used  . Tobacco comment: He's been smoking between 0.5 and 2 ppd since age 53.  Substance and Sexual Activity  . Alcohol use: No    Alcohol/week: 0.0 standard drinks    Comment: "quit drinking in 1986"  . Drug use: No  . Sexual activity: Never  Lifestyle  . Physical activity    Days per week: Not on file    Minutes per session: Not on file  . Stress: Not on file  Relationships  . Social Herbalist on phone: Not on file    Gets together: Not on file    Attends religious service: Not on file    Active member of club or organization: Not on file    Attends meetings of clubs or organizations: Not on file    Relationship status: Not on file  Other Topics Concern  . Not on file  Social History Narrative   He is a widower, who moved to [place] in 2011 to live closer to his daughter. He formerly lived in [place]. He is a father of 33, grandfather, and great-grandfather of 8. Does not drink alcohol, context of smoker.     Observations/Objective: Awake, alert and oriented x 3   Review of Systems  Constitutional: Positive for  malaise/fatigue. Negative for fever and weight loss.  HENT: Negative.  Negative for nosebleeds.   Eyes: Negative.  Negative for blurred vision, double vision and photophobia.  Respiratory: Negative.  Negative for cough and shortness of breath.   Cardiovascular: Negative.  Negative for chest pain, palpitations and leg swelling.  Gastrointestinal: Negative.  Negative for heartburn, nausea and vomiting.  Musculoskeletal: Negative.  Negative for myalgias.  Neurological: Positive for weakness. Negative for dizziness, focal weakness, seizures and headaches.  Endo/Heme/Allergies: Positive for environmental allergies.  Psychiatric/Behavioral: Negative.  Negative for suicidal ideas.    Assessment and Plan:  Diagnoses and all orders for this visit:  Hospital discharge follow-up  Type 2 diabetes mellitus with hyperosmolarity without coma, without long-term current use of insulin ([place]) -     Ambulatory referral to Ophthalmology -     Misc. Devices MISC; Please provide patient with insurance approved glucometer, strips, lancets.  Quantity 100 strips/lancets.  Monitor blood glucose levels twice per day. E11.00 -     glimepiride (AMARYL) 2 MG tablet; Take 1 tablet (2 mg total) by mouth daily before breakfast.     Follow Up Instructions Return in about 2 months (around 02/03/2019).     I discussed the assessment and treatment plan with the patient. The patient was provided an opportunity to ask questions and all were answered. The patient agreed with the plan and demonstrated an understanding of the instructions.   The patient was advised to call back or seek an in-person evaluation if the symptoms worsen or if the condition fails to improve as anticipated.  I provided 23 minutes of non-face-to-face time during this encounter including median intraservice time, reviewing previous notes, labs, imaging, medications and explaining diagnosis and management.  Gildardo Pounds, FNP-BC

## 2018-12-06 ENCOUNTER — Telehealth: Payer: Self-pay

## 2018-12-06 NOTE — Telephone Encounter (Signed)
Call placed to patient # [phone] at request of Geryl Rankins, NP.  Patient is interested in information about facility placement.  Message left requesting a call back to this CM # [phone]

## 2018-12-07 IMAGING — CR DG CHEST 2V
2 series · 2 of 2 positions shown · non-contrast
Comparison: 07/29/2017

CLINICAL DATA: Generalized weakness and anemia. Numbness and
tingling in the hands, arms and feet.

EXAM:
CHEST - 2 VIEW

[w chest lat]
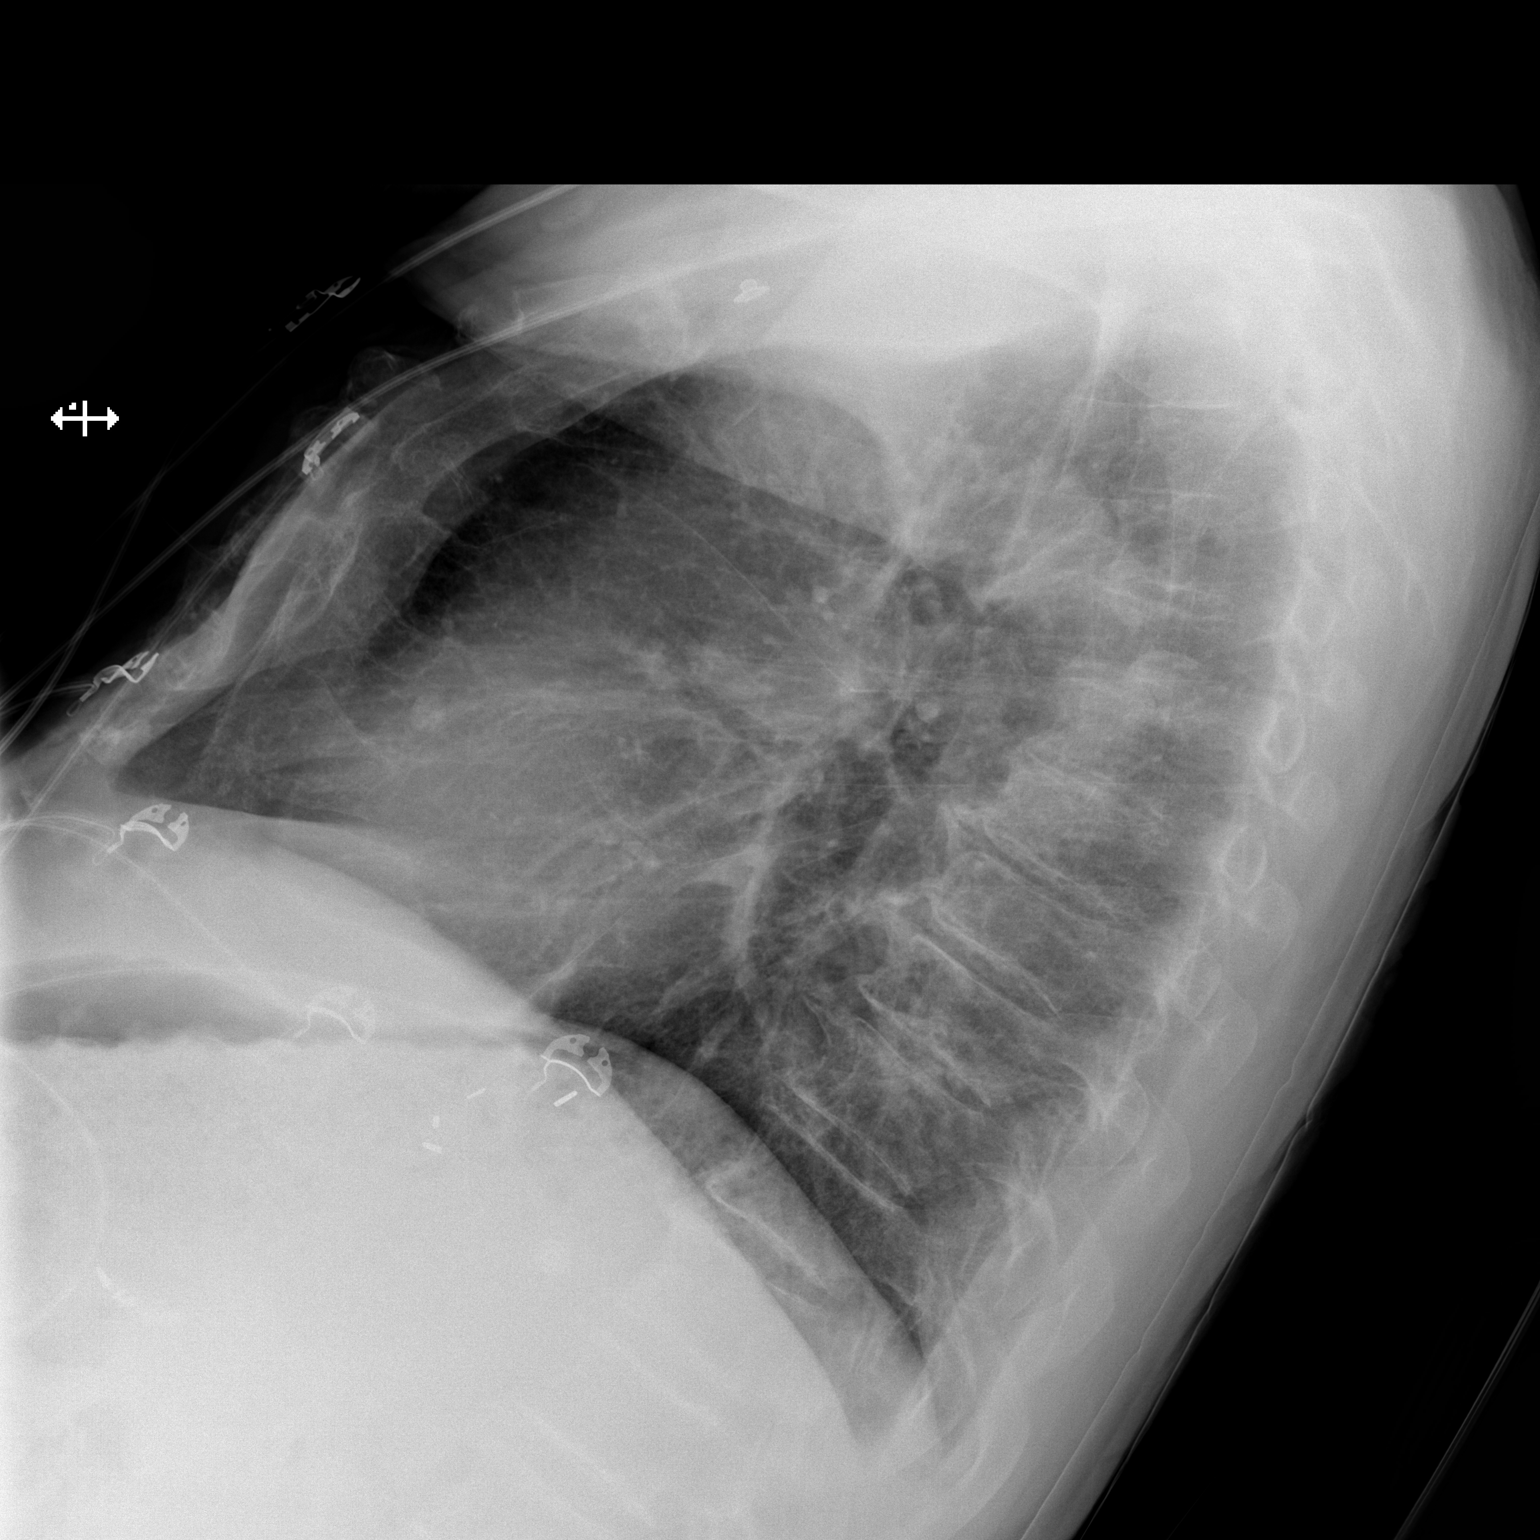

[x chest ap]
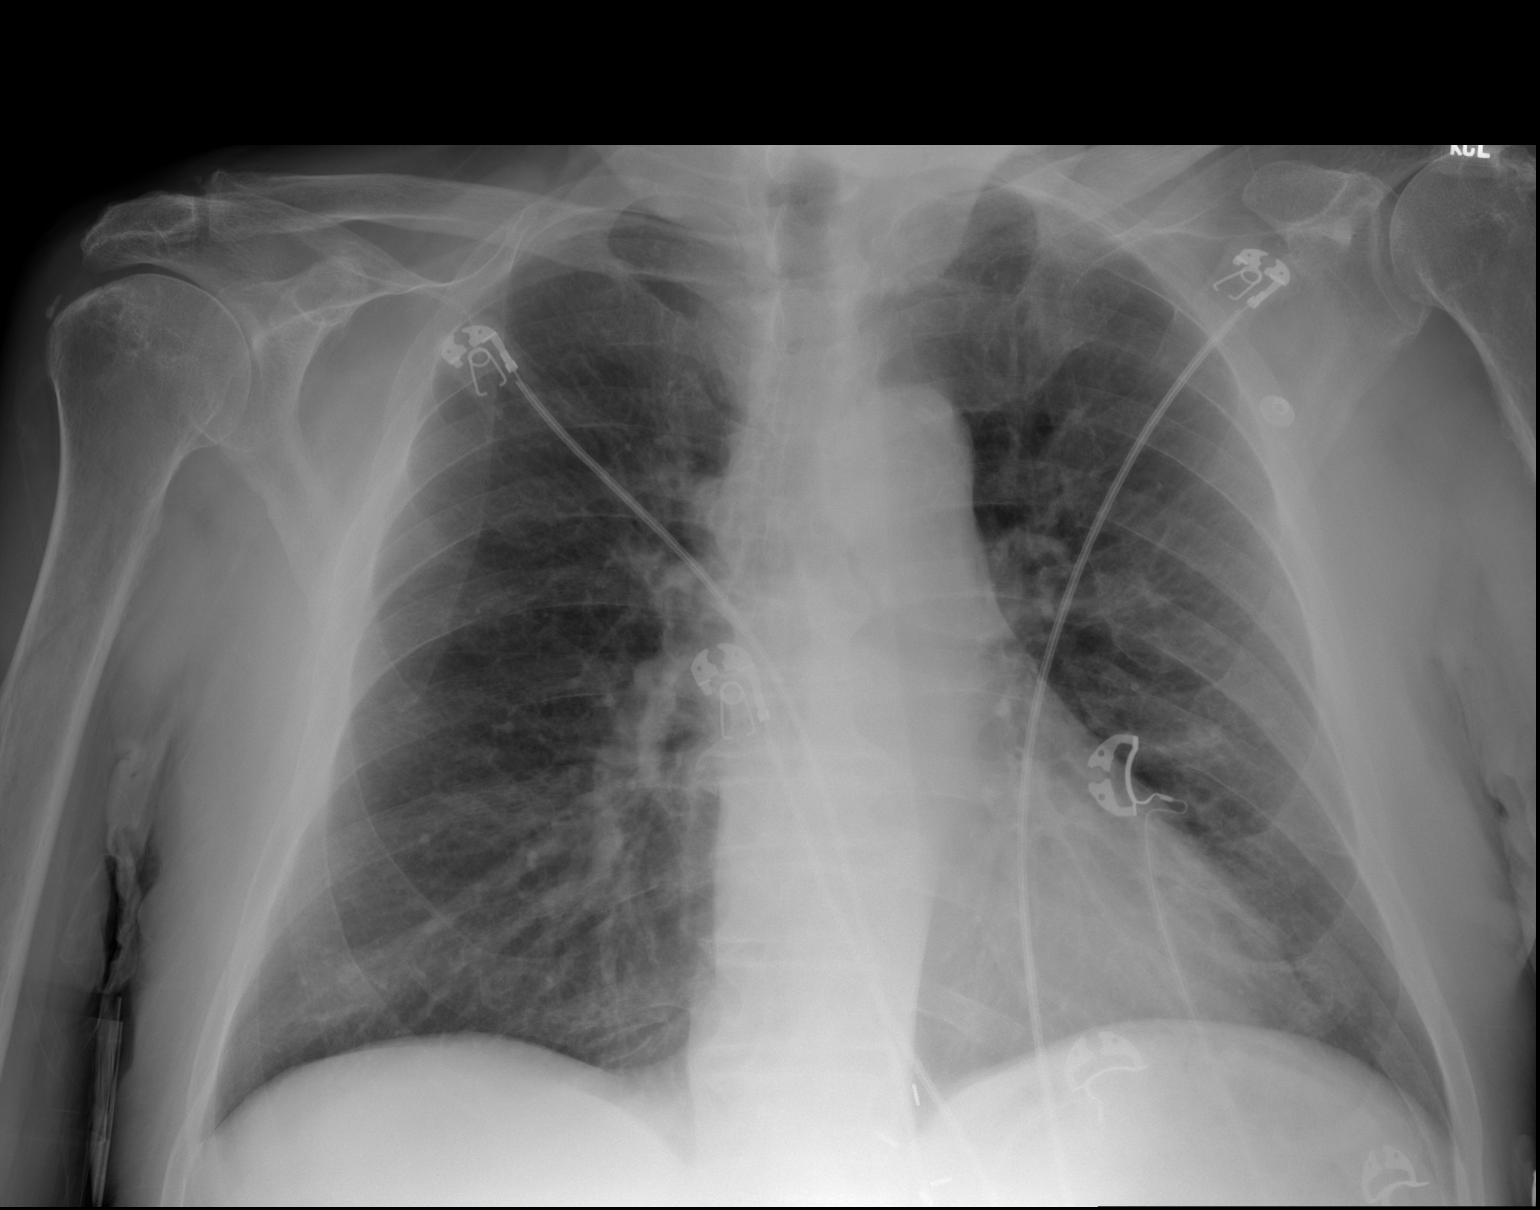

[2 of 2 positions shown; findings below may reference images not displayed]

FINDINGS: Heart size is normal. Mediastinal shadows are unremarkable except
for aortic atherosclerosis. The lungs are clear. The vascularity is
normal. No effusions. No acute bone finding.
IMPRESSION: No active cardiopulmonary disease.

## 2018-12-09 ENCOUNTER — Encounter (HOSPITAL_COMMUNITY): Payer: Self-pay | Admitting: Emergency Medicine

## 2018-12-09 ENCOUNTER — Emergency Department (HOSPITAL_COMMUNITY)
Admission: EM | Admit: 2018-12-09 | Discharge: 2018-12-09 | Disposition: A | Discharge status: Home / Self Care | Payer: Medicare Other | Attending: Emergency Medicine | Admitting: Emergency Medicine

## 2018-12-09 DIAGNOSIS — J449 Chronic obstructive pulmonary disease, unspecified: Secondary | ICD-10-CM | POA: Insufficient documentation

## 2018-12-09 DIAGNOSIS — E1165 Type 2 diabetes mellitus with hyperglycemia: Secondary | ICD-10-CM | POA: Diagnosis not present

## 2018-12-09 DIAGNOSIS — Z7689 Persons encountering health services in other specified circumstances: Secondary | ICD-10-CM

## 2018-12-09 DIAGNOSIS — I2510 Atherosclerotic heart disease of native coronary artery without angina pectoris: Secondary | ICD-10-CM | POA: Insufficient documentation

## 2018-12-09 DIAGNOSIS — I5032 Chronic diastolic (congestive) heart failure: Secondary | ICD-10-CM | POA: Diagnosis not present

## 2018-12-09 DIAGNOSIS — Z466 Encounter for fitting and adjustment of urinary device: Secondary | ICD-10-CM | POA: Diagnosis not present

## 2018-12-09 DIAGNOSIS — E119 Type 2 diabetes mellitus without complications: Secondary | ICD-10-CM | POA: Insufficient documentation

## 2018-12-09 DIAGNOSIS — F17210 Nicotine dependence, cigarettes, uncomplicated: Secondary | ICD-10-CM | POA: Diagnosis not present

## 2018-12-09 DIAGNOSIS — Z79899 Other long term (current) drug therapy: Secondary | ICD-10-CM | POA: Diagnosis not present

## 2018-12-09 DIAGNOSIS — I110 Hypertensive heart disease with heart failure: Secondary | ICD-10-CM | POA: Insufficient documentation

## 2018-12-09 DIAGNOSIS — Z7982 Long term (current) use of aspirin: Secondary | ICD-10-CM | POA: Insufficient documentation

## 2018-12-09 NOTE — ED Triage Notes (Signed)
Pt here today stating that his leg bag is overflowing and that it leaks , pt looks attached and is dry , pt has been seen for same complaint multiple times

## 2018-12-09 NOTE — ED Provider Notes (Signed)
[facility] EMERGENCY DEPARTMENT Provider Note   CSN: [id number] Arrival date & time: 12/09/18  1222     History   Chief Complaint No chief complaint on file.   HPI [name] is a 78 y.o. male.     Patient with hx of having chronic foley catheter states leg bag has been leaking intermittently. Patient indicates is urinating normal amount, and emptying bag prn, but that occasionally it will leak. Has new/replacement bags at home, and has changed. Denies urine coming out of penis around catheter. No abdominal pain or suprapubic pain or fullness. No flank pain. No fever or chills. Normal appetite.   The history is provided by the patient.    Past Medical History:  Diagnosis Date  . Allergic rhinitis   . Anemia    takes Ferrous Sulfate daily  . Arthritis    "all over"  . Atrial flutter ([place])   . Balance problem 01/2014  . CAP (community acquired pneumonia) 09/18/2014  . Cervical radiculopathy due to degenerative joint disease of spine   . Chronic coronary artery disease    a. 03/2010 Nonocclusive disease by cath, performed for ST elevations on ECG;  b. 06/2013 Lexi MV: EF 60%, no ischemia.  [name] COPD (chronic obstructive pulmonary disease) ([place])   . Diabetes mellitus type II    takes Metformin and Lantus daily  . Diastolic CHF, chronic ([place])    a. 12/2012 EF 55-60%, diast dysfxn, triv MR, mildly dil LA/RA.  [name] Dyspnea    with pain  . Essential hypertension    takes Diltiazem daily  . Frail elderly   . GERD (gastroesophageal reflux disease)   . History of blood transfusion 1982   "when I had stomach OR"  . History of bronchitis    1998  . History of CVA (cerebrovascular accident)   . History of gastric ulcer   . History of kidney stones   . Hypercholesterolemia    takes Pravastatin daily  . Intrinsic eczema   . Legally blind in left eye, as defined in [place]   . Obesity   . PAF (paroxysmal atrial fibrillation) ([facility])    Recurrent after atrial  flutter (a. 07/2010 Status post caval tricuspid isthmus ablation by Dr. Midge Aver Metoprolol daily), currently controlled on flecainide plus diltiazem and Coumadin  . Urinary retention   . Vitamin D deficiency   . Weakness    numbness and tingling both hands    Patient Active Problem List   Diagnosis Date Noted  . Cardiac pacemaker in situ 10/29/2018  . Major depressive disorder, recurrent severe without psychotic features ([place]) 04/15/2018  . Depression   . Atrial fibrillation with RVR ([place]) 11/08/2017  . HTN (hypertension) 05/21/2017  . Diabetes mellitus type II 05/21/2017  . Diastolic CHF, chronic ([place]) 05/21/2017  . COPD (chronic obstructive pulmonary disease) ([place]) 05/21/2017  . H/O atrial flutter 05/21/2017  . Abdominal pain 10/02/2016  . Constipation 10/02/2016  . Left lower quadrant pain   . Heme positive stool   . Symptomatic anemia 07/10/2016  . Cardiac syncope 05/15/2015  . Cervical disc disease 05/15/2015  . Anemia 05/15/2015  . History of CVA (cerebrovascular accident) 04/03/2014  . Benign neoplasm of rectum and anal canal 12/02/2013  . Upper GI bleeding 11/30/2013  . Tobacco use disorder 11/30/2013  . Type 2 diabetes mellitus ([place]) 01/22/2013  . Hyperlipidemia   . Obesity (BMI 30-39.9) 12/14/2012  . Persistent atrial fibrillation ([place]) 10/07/2010  . Atrial flutter - status post CTI  ablation 07/29/2010  . Essential hypertension 07/29/2010  . Chronic diastolic congestive heart failure ([place]) 07/29/2010  . Coronary artery disease, non-occlusive 07/29/2010    Past Surgical History:  Procedure Laterality Date  . ATRIAL ABLATION SURGERY  08/05/10   CTI ablation for atrial flutter by JA  . CARDIAC CATHETERIZATION  2012   nl LV function, no occlusive CAD, PAF  . CARDIOVERSION  12/07/2010    Successful direct current cardioversion with atrial fibrillation to normal sinus rhythm  . CARPAL TUNNEL RELEASE Bilateral 01/30/2014   Procedure: BILATERAL CARPAL TUNNEL  RELEASE;  Surgeon: Marianna Payment, MD;  Location: [facility];  Service: Orthopedics;  Laterality: Bilateral;  . CATARACT EXTRACTION W/ INTRAOCULAR LENS  IMPLANT, BILATERAL Bilateral   . COLONOSCOPY [name] 12/02/2013   Procedure: COLONOSCOPY;  Surgeon: Irene Shipper, MD;  Location: [facility];  Service: Endoscopy;  Laterality: [name];  . ESOPHAGOGASTRODUODENOSCOPY [name] 09/22/2014   Procedure: ESOPHAGOGASTRODUODENOSCOPY (EGD);  Surgeon: Ronald Lobo, MD;  Location: [facility]COPY;  Service: Endoscopy;  Laterality: [name];  . ESOPHAGOGASTRODUODENOSCOPY [name] 07/11/2016   Procedure: ESOPHAGOGASTRODUODENOSCOPY (EGD);  Surgeon: Doran Stabler, MD;  Location: [facility] ENDOSCOPY;  Service: Endoscopy;  Laterality: [name];  . EXTRACORPOREAL SHOCK WAVE LITHOTRIPSY Right 07/15/2018   Procedure: EXTRACORPOREAL SHOCK WAVE LITHOTRIPSY (ESWL);  Surgeon: Lucas Mallow, MD;  [facility];  Service: Urology;  Laterality: Right;  . INCISION AND DRAINAGE ABSCESS / HEMATOMA OF BURSA / KNEE / THIGH Left 1998   knee  . KNEE BURSECTOMY Left 1998  . LAPAROSCOPIC CHOLECYSTECTOMY  03/2010  . LEFT HEART CATH AND CORONARY ANGIOGRAPHY [name] 10/05/2017   Procedure: LEFT HEART CATH AND CORONARY ANGIOGRAPHY;  Surgeon: Martinique, Peter M, MD;  Location: [facility];  Service: Cardiovascular;  Laterality: [name];  . NM MYOVIEW LTD  07/22/2013   Normal EF ~60%, no ischemia or infarction.  [name] PACEMAKER IMPLANT [name] 10/21/2018   Procedure: PACEMAKER IMPLANT;  Surgeon: Deboraha Sprang, MD;  Location: [facility];  Service: Cardiovascular;  Laterality: [name];  . PARTIAL GASTRECTOMY  1982   subtotal; "took out 30% for ulcers"  . TRANSTHORACIC ECHOCARDIOGRAM  02/16/2014   EF 60%, no RWMA. - otherwise normal  . YAG LASER APPLICATION Bilateral         Home Medications    Prior to Admission medications   Medication Sig Start Date End Date Taking? Authorizing Provider  Accu-Chek FastClix Lancets MISC USE TO TEST BLOOD SUGAR TWICE DAILY. E11.00  12/04/18   Charlott Rakes, MD  aspirin EC 81 MG tablet Take 81 mg by mouth daily.    [provider]  citalopram (CELEXA) 20 MG tablet Take 1 tablet (20 mg total) by mouth daily. 10/02/18   Scot Jun, FNP  diclofenac sodium (VOLTAREN) 1 % GEL APPLY 2 GRAMS TOPICALLY 2 TIMES DAILY. 11/25/18   [provider]  diltiazem (CARDIZEM CD) 360 MG 24 hr capsule Take 1 capsule (360 mg total) by mouth daily. 10/23/18   Kayleen Memos, DO  ferrous sulfate 325 (65 FE) MG tablet Take 325 mg by mouth daily with breakfast.    [provider]  glimepiride (AMARYL) 2 MG tablet Take 1 tablet (2 mg total) by mouth daily before breakfast. 12/04/18   Gildardo Pounds, NP  nitroGLYCERIN (NITROSTAT) 0.4 MG SL tablet Place 1 tablet (0.4 mg total) under the tongue every 5 (five) minutes x 3 doses as needed for chest pain. 11/19/15   Erlene Quan, PA-C  pantoprazole (  PROTONIX) 40 MG tablet Take 1 tablet (40 mg total) by mouth 2 (two) times daily before a meal. 10/02/18   Scot Jun, FNP  pravastatin (PRAVACHOL) 40 MG tablet Take 1 tablet (40 mg total) by mouth every evening. 10/02/18   Scot Jun, FNP  tamsulosin (FLOMAX) 0.4 MG CAPS capsule Take 1 capsule (0.4 mg total) by mouth daily. 10/02/18   Scot Jun, FNP    Family History Family History  Problem Relation Age of Onset  . Breast cancer Mother   . Diabetes Mother   . Kidney disease Mother   . Heart attack Father   . Heart disease Father   . Heart attack Brother   . Leukemia Daughter     Social History Social History   Tobacco Use  . Smoking status: Current Every Day Smoker    Packs/day: 0.25    Years: 57.00    Pack years: 14.25    Types: Cigarettes  . Smokeless tobacco: Never Used  . Tobacco comment: He's been smoking between 0.5 and 2 ppd since age 56.  Substance Use Topics  . Alcohol use: No    Alcohol/week: 0.0 standard drinks    Comment: "quit drinking in 1986"  . Drug use: No     Allergies    Cyclobenzaprine and Other   Review of Systems Review of Systems  Constitutional: Negative for chills and fever.  Respiratory: Negative for shortness of breath.   Cardiovascular: Negative for chest pain.  Gastrointestinal: Negative for abdominal pain and vomiting.  Genitourinary: Negative for decreased urine volume and flank pain.  Musculoskeletal: Negative for back pain.  Neurological: Negative for light-headedness.     Physical Exam Updated Vital Signs BP 135/70 (BP Location: Right Arm)   Pulse (!) 107   Temp 98 F (36.7 C) (Oral)   Resp 18   SpO2 95%   Physical Exam Vitals signs and nursing note reviewed.  Constitutional:      Appearance: Normal appearance. He is well-developed.  HENT:     Head: Atraumatic.     Nose: Nose normal.     Mouth/Throat:     Mouth: Mucous membranes are moist.     Pharynx: Oropharynx is clear.  Eyes:     General: No scleral icterus.    Conjunctiva/sclera: Conjunctivae normal.  Neck:     Musculoskeletal: Normal range of motion and neck supple. No neck rigidity.     Trachea: No tracheal deviation.  Cardiovascular:     Rate and Rhythm: Normal rate.     Pulses: Normal pulses.  Pulmonary:     Effort: Pulmonary effort is normal. No accessory muscle usage or respiratory distress.  Abdominal:     General: Bowel sounds are normal. There is no distension.     Palpations: Abdomen is soft.     Tenderness: There is no abdominal tenderness. There is no guarding.  Genitourinary:    Comments: No cva tenderness. Normal external gu exam. Foley cath in place. Urine is draining through cath, into bag. Urine appears clear/yellow.  Musculoskeletal:        General: No swelling.  Skin:    General: Skin is warm and dry.     Findings: No rash.  Neurological:     Mental Status: He is alert.     Comments: Alert, speech clear.   Psychiatric:        Mood and Affect: Mood normal.      ED Treatments / Results  Labs (all labs ordered are  listed, but only  abnormal results are displayed) Labs Reviewed - No data to display  EKG None  Radiology No results found.  Procedures Procedures (including critical care time)  Medications Ordered in ED Medications - No data to display   Initial Impression / Assessment and Plan / ED Course  I have reviewed the triage vital signs and the nursing notes.  Pertinent labs & imaging results that were available during my care of the patient were reviewed by me and considered in my medical decision making (see chart for details).  Approximately 200-300 cc clear light yellow urine in bag - bag moved in every directions, no leaking from bag. Bag appears new/intact. Abd soft nt.   Bladder scan - no significant urine in bladder.   Currently, check by self, hr 88, rr 16.   ?whether perhaps earlier bag/screw cap was not on securely, or whether patient had bag position in superior position. Patient reassured, educated on bag position, emptying bag, etc.   Rec pcp f/u.   Return precautions provided.     Final Clinical Impressions(s) / ED Diagnoses   Final diagnoses:  None    ED Discharge Orders    None       Lajean Saver, MD 12/09/18 1305

## 2018-12-09 NOTE — Discharge Instructions (Addendum)
It was our pleasure to provide your ER care today - we hope that you feel better.  Make sure to keep the leg bag on your leg, in a dependent position, below the level of your bladder. Empty leg bag as need, making sure to screw back cap tightly.   Follow up with your doctor/urologist in the next 1-2 weeks.   Return to ER if worse, new symptoms, fevers, trouble breathing, catheter not draining, abdominal pain, vomiting, or other concern.

## 2018-12-10 ENCOUNTER — Telehealth: Payer: Self-pay

## 2018-12-10 NOTE — Telephone Encounter (Signed)
Attempted again to contact the patient # [phone] to discuss facility placement. Message left requesting a call back to this CM # [phone]

## 2018-12-11 ENCOUNTER — Telehealth: Payer: Self-pay

## 2018-12-11 NOTE — Telephone Encounter (Signed)
Attempted again to contact the patient # [phone] to discuss facility placement. Message left requesting a call back to this CM # [phone]  Call also placed to patient's daughter, [name] # [phone], message left requesting patient call back to this CM # [phone]

## 2018-12-15 IMAGING — DX DG CHEST 2V
2 series · 2 of 2 positions shown · non-contrast
Comparison: Chest x-ray dated September 26, 2017.

CLINICAL DATA: Intermittent chest pressure radiating to the right
shoulder and arm.

EXAM:
CHEST - 2 VIEW

[x chest ap]
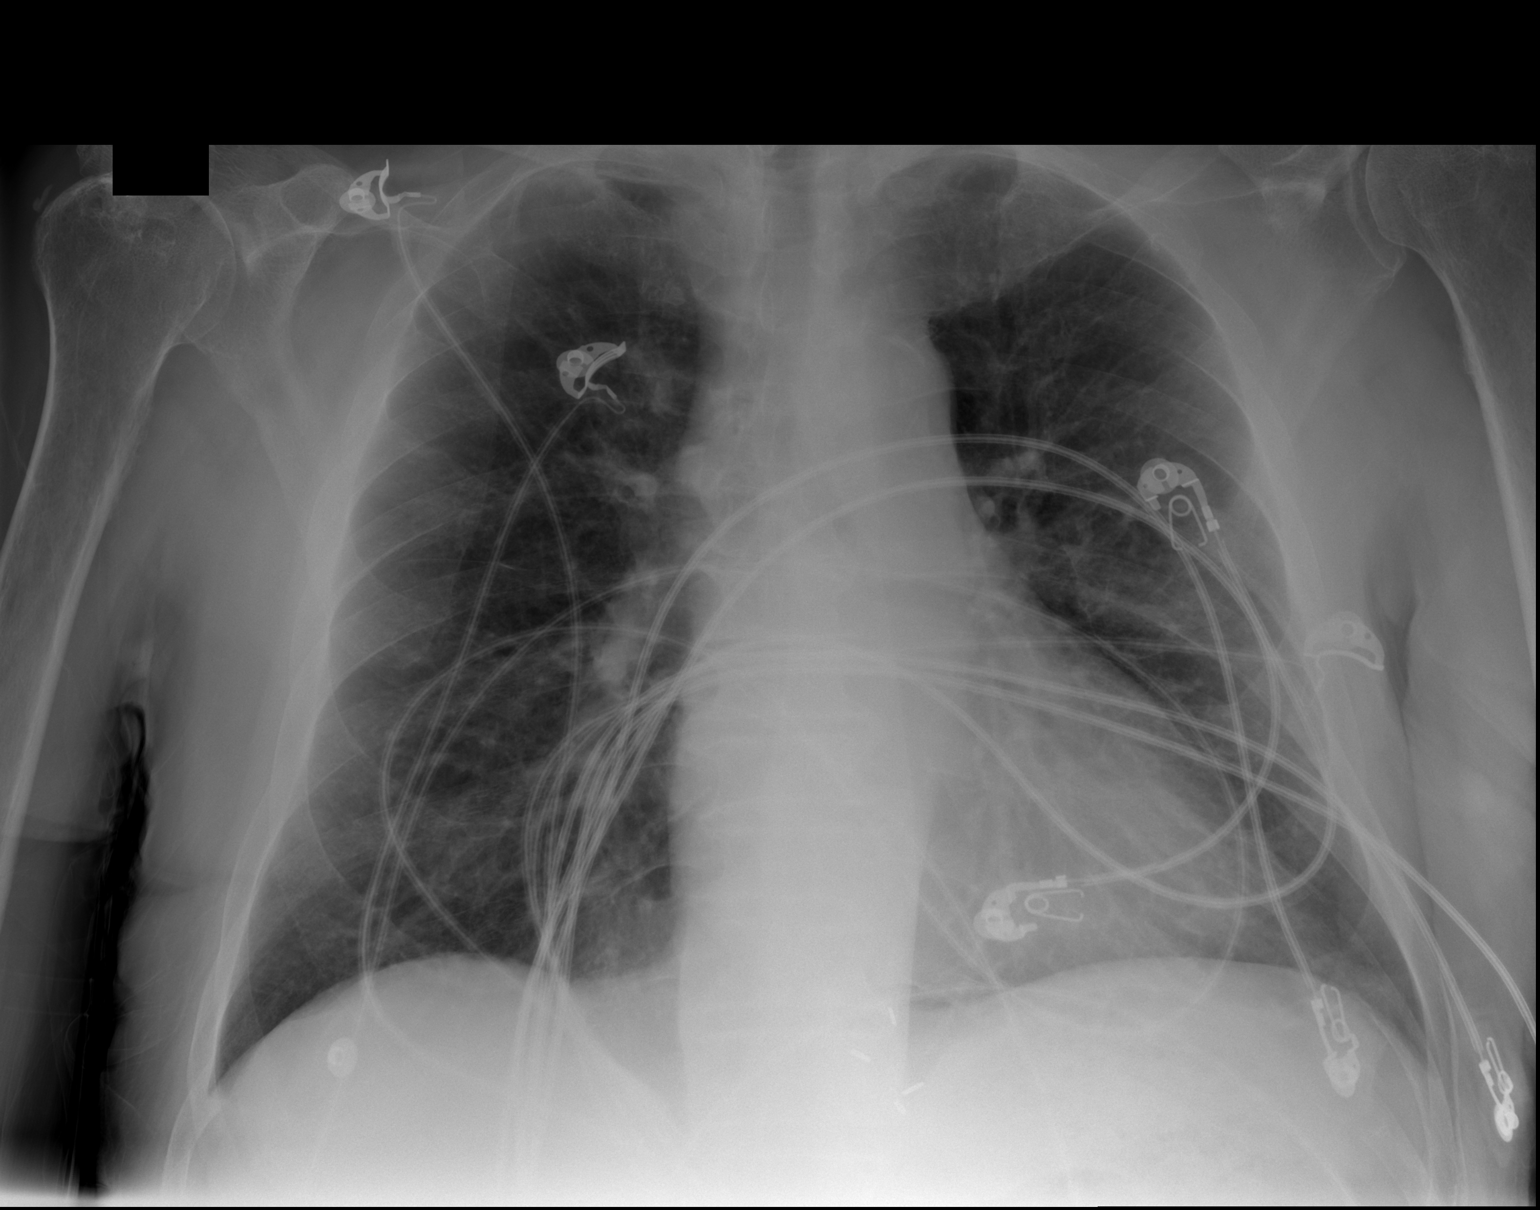

[w chest lat]
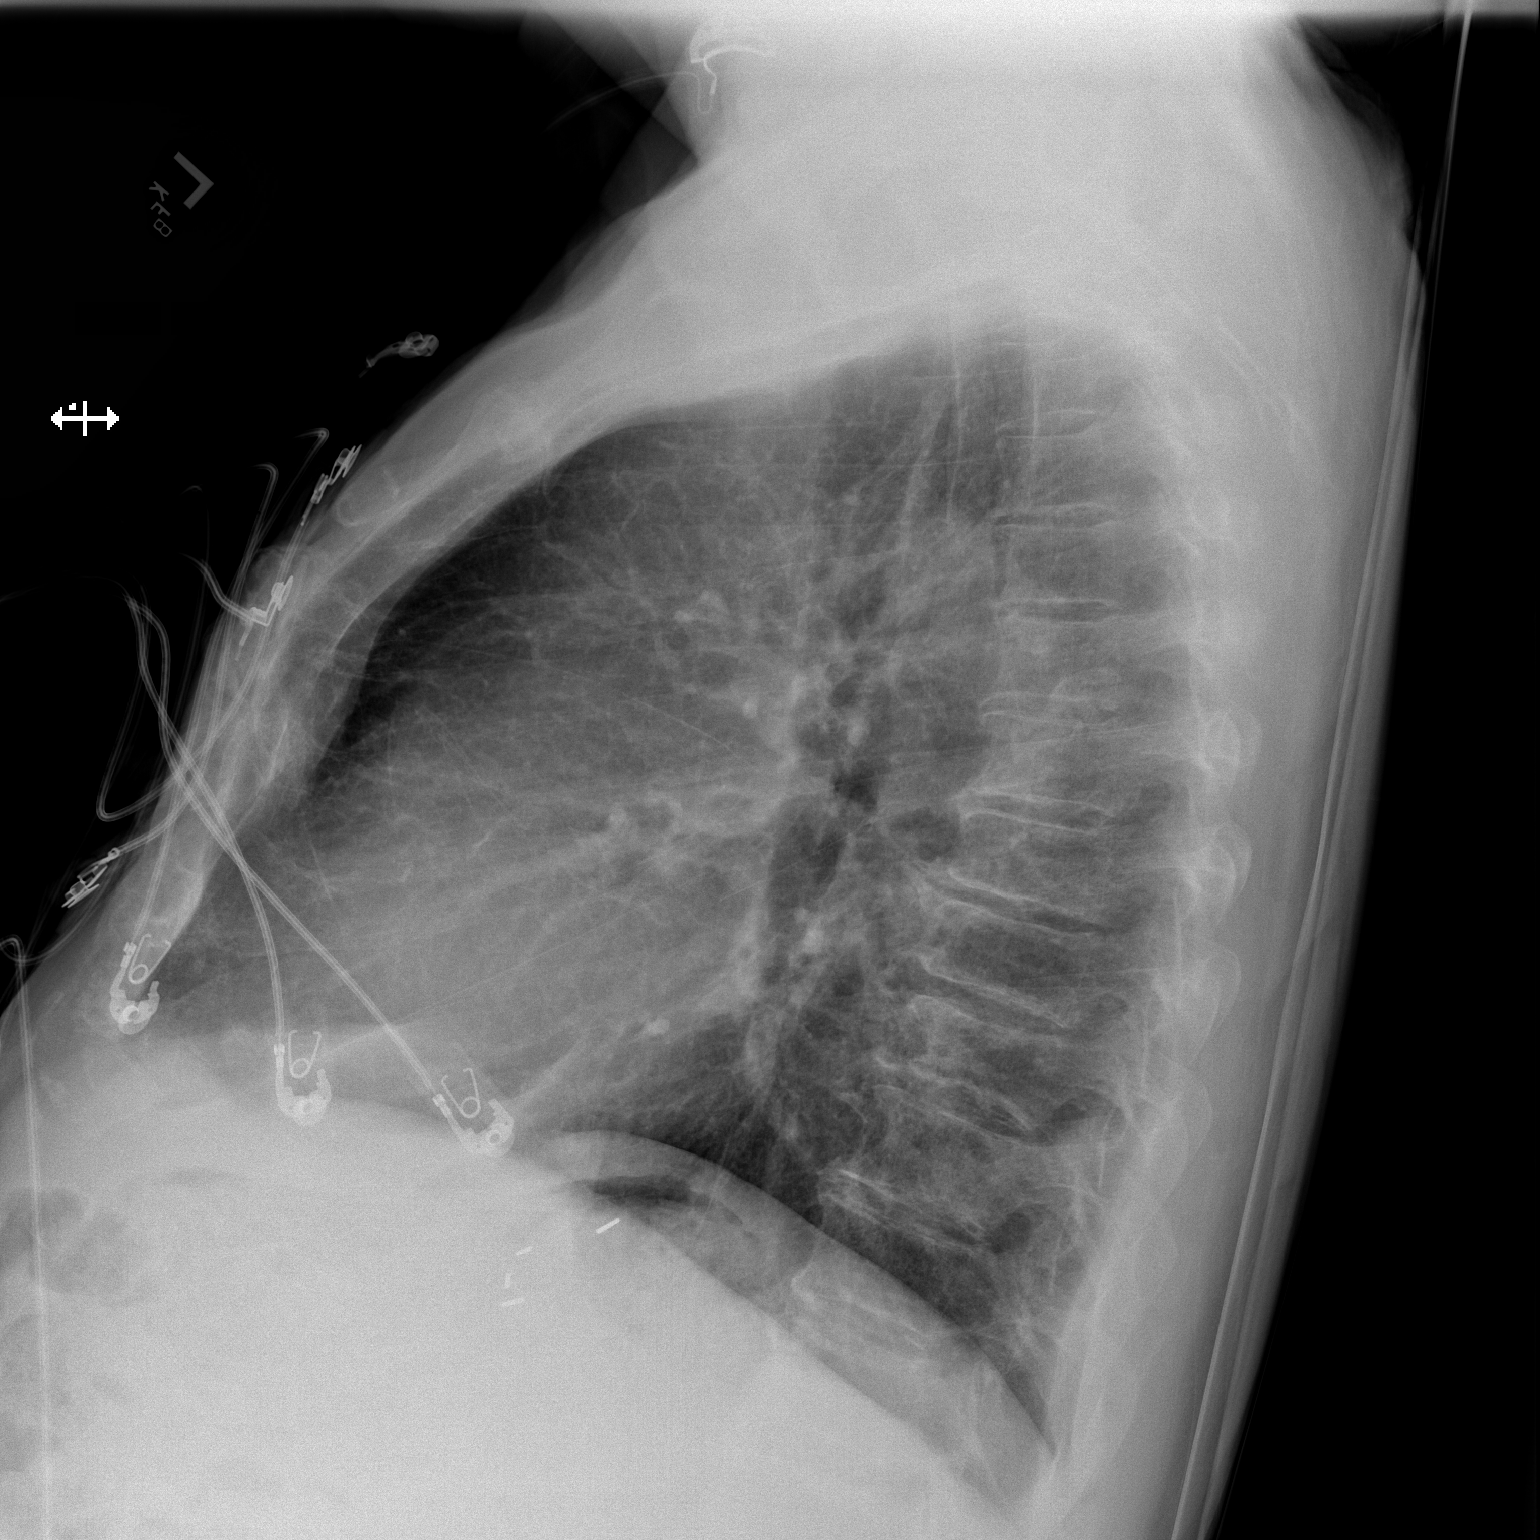

[2 of 2 positions shown; findings below may reference images not displayed]

FINDINGS: The heart size and mediastinal contours are within normal limits.
Normal pulmonary vascularity. No focal consolidation, pleural
effusion, or pneumothorax. No acute osseous abnormality.
IMPRESSION: No active cardiopulmonary disease.

## 2018-12-16 IMAGING — CR DG ABDOMEN 2V
2 series · 2 of 2 positions shown · non-contrast
Comparison: CT scan July 27, 2017

CLINICAL DATA: Left flank pain. History of kidney stone for 3
weeks.

EXAM:
ABDOMEN - 2 VIEW

[abdomen erect]
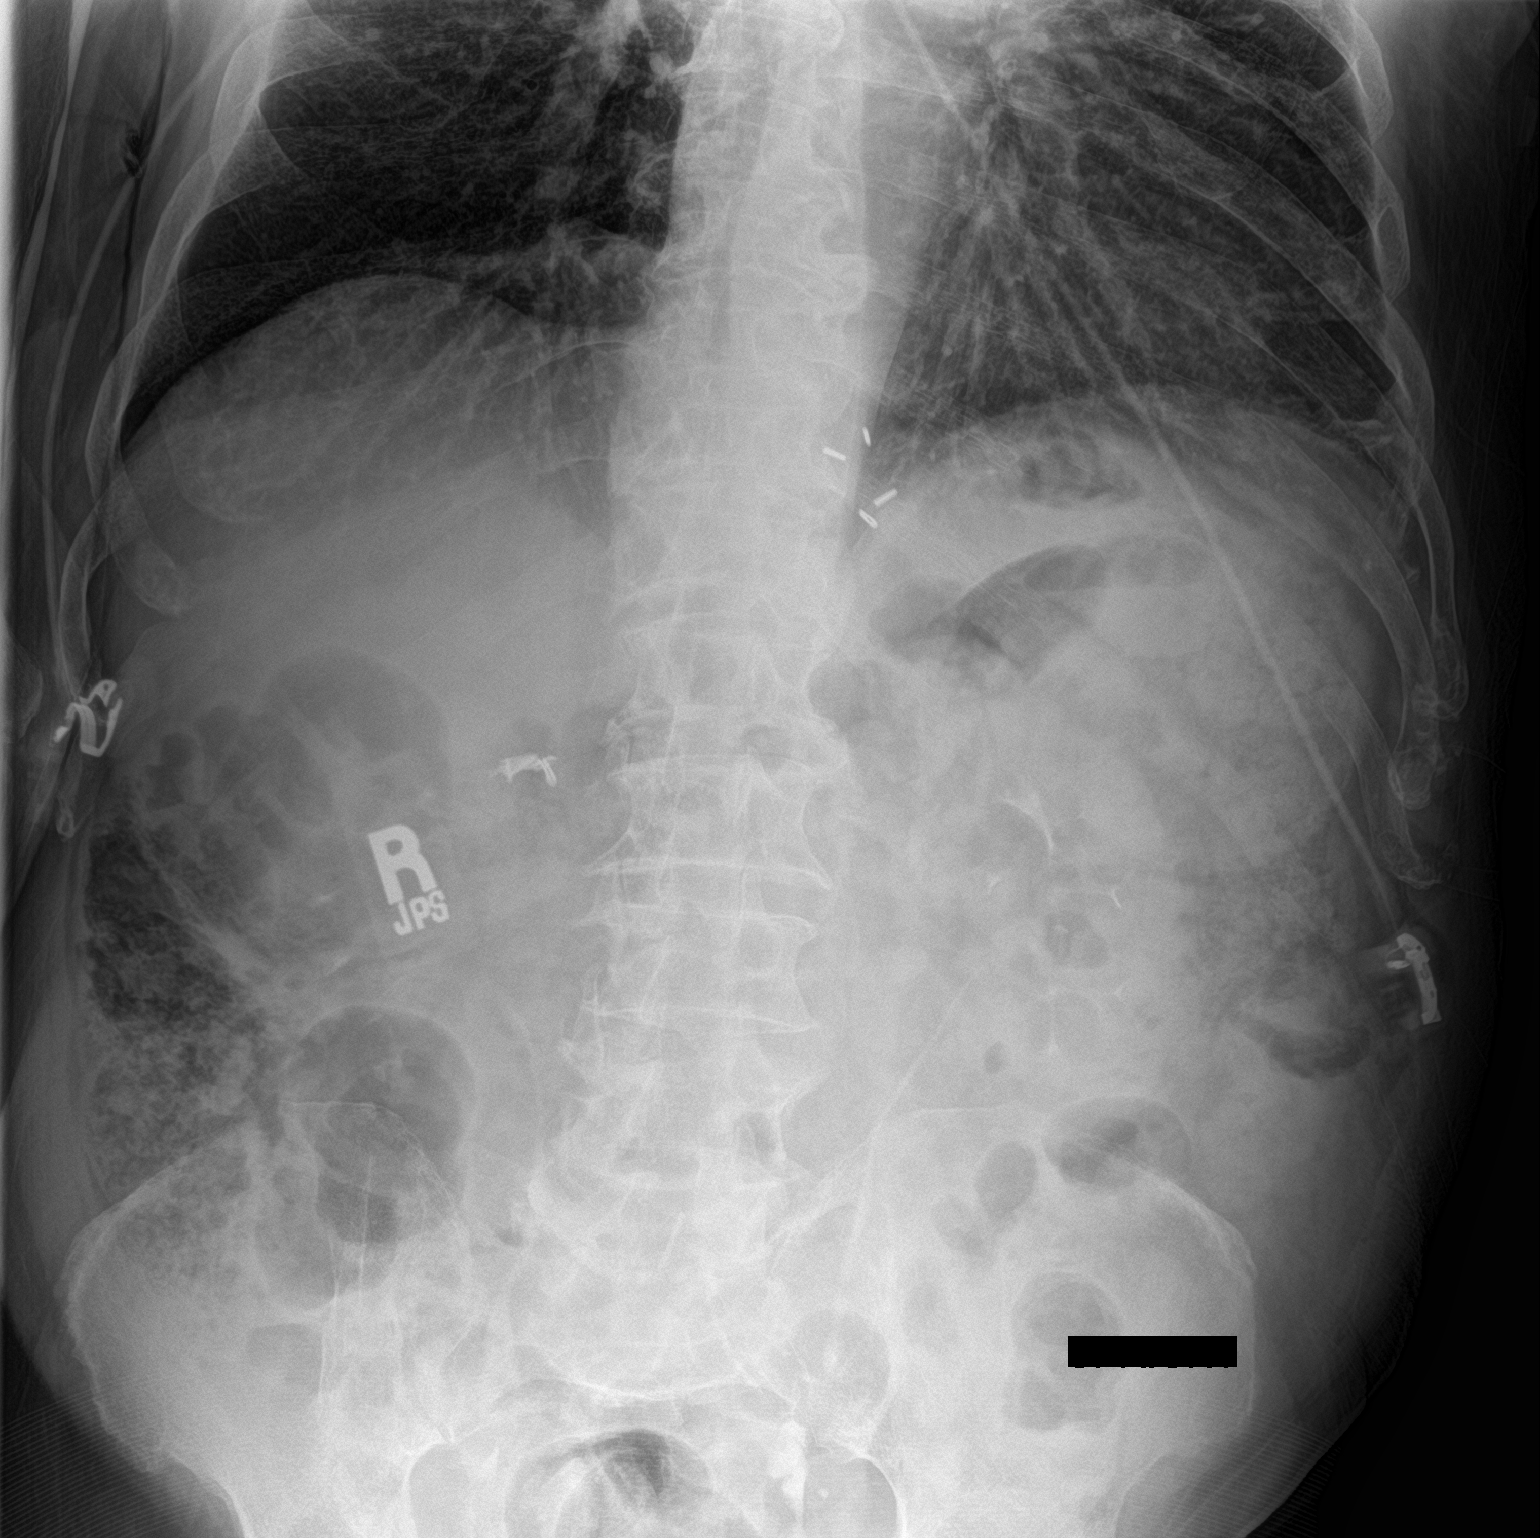

[abdomen supine]
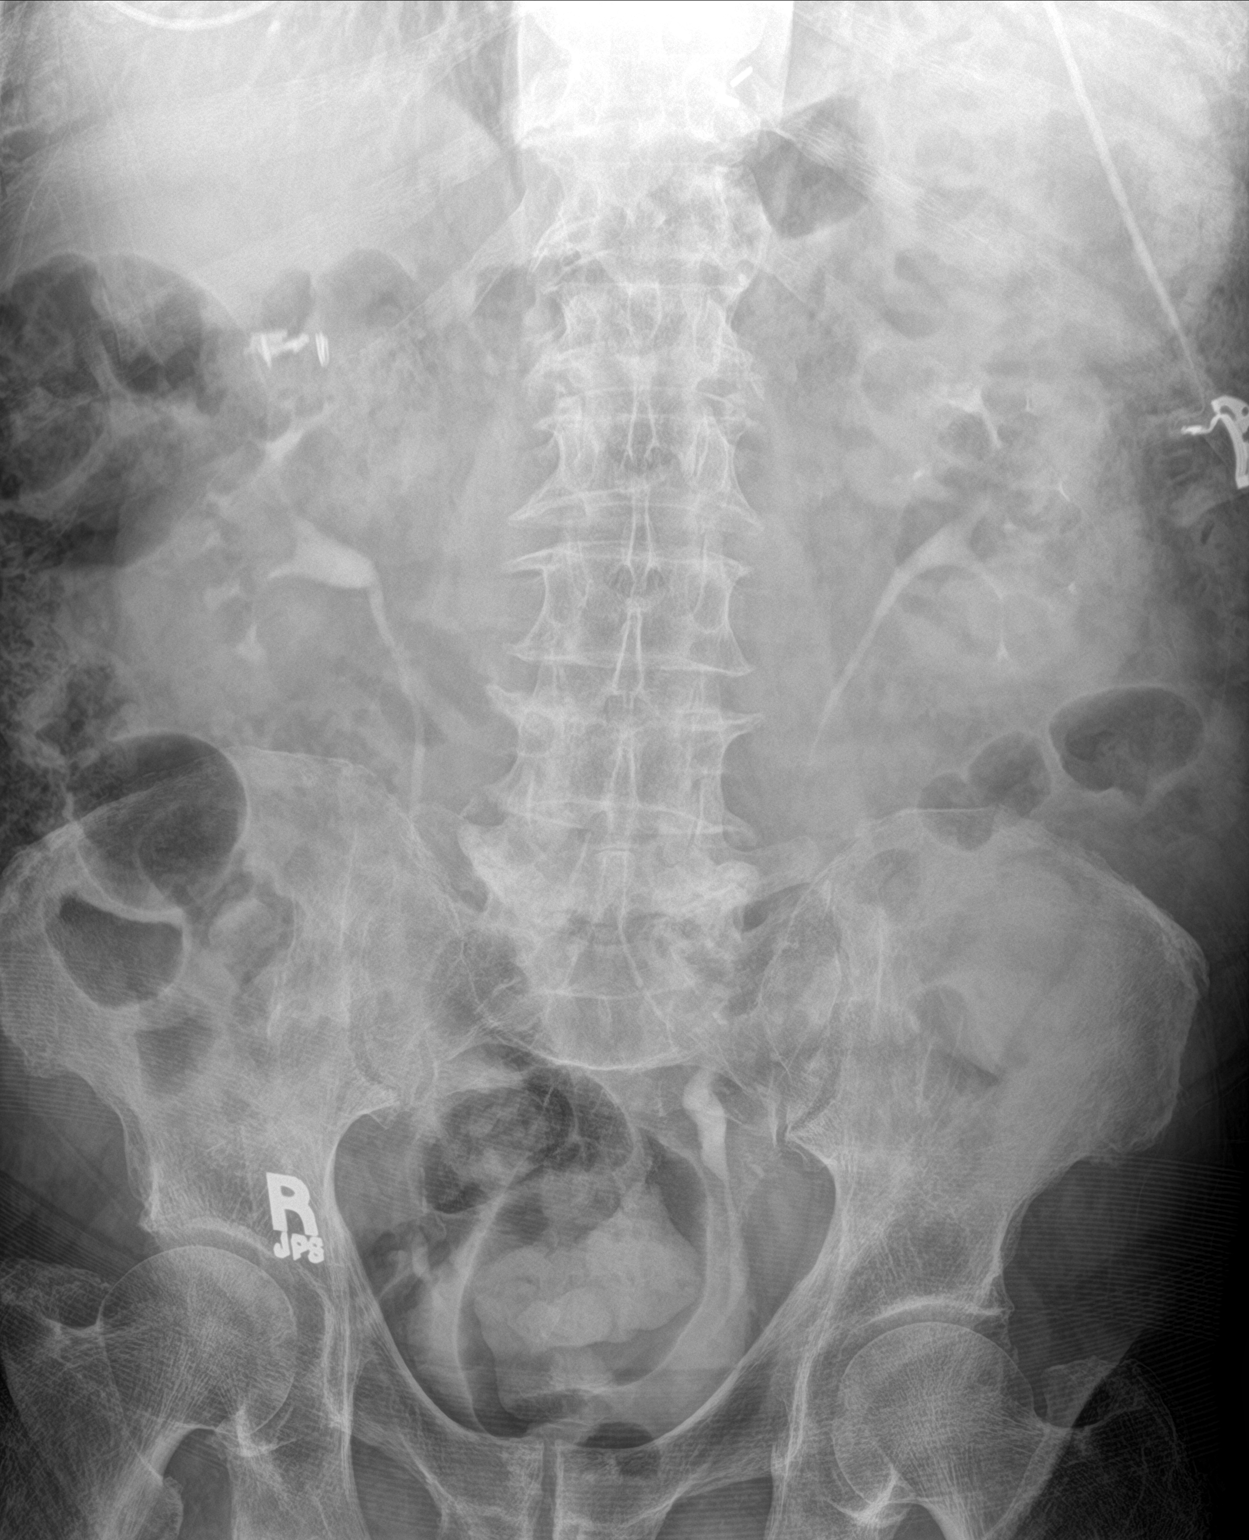

[2 of 2 positions shown; findings below may reference images not displayed]

FINDINGS: There is contrast in the renal collecting systems consistent with a
CT scan from earlier today. No obvious renal stones are noted on
today's study. No filling defects in the opacified portions of the
renal collecting systems or bladder. No other acute abnormalities
are identified.
IMPRESSION: No obvious renal stones or ureteral stones. No acute abnormalities
noted.

## 2018-12-16 IMAGING — CT CT HEART MORP W/ CTA COR W/ SCORE W/ CA W/CM &/OR W/O CM
4 of 7 series · 8 of 20 positions shown, 9 images · non-contrast
Comparison: None.

CLINICAL DATA: 77-year-old male with h/o atrial fibrillation and
EVALIZ.

EXAM:
Cardiac/Coronary  CT
TECHNIQUE: The patient was scanned on a [employer] Force scanner.

[Series 6: best diast 66 % · axial · 0.39mm/px · z∈[+1248,+1296]mm · 2 of 358 slices shown, 3 images]
[im 120/358  vessel]
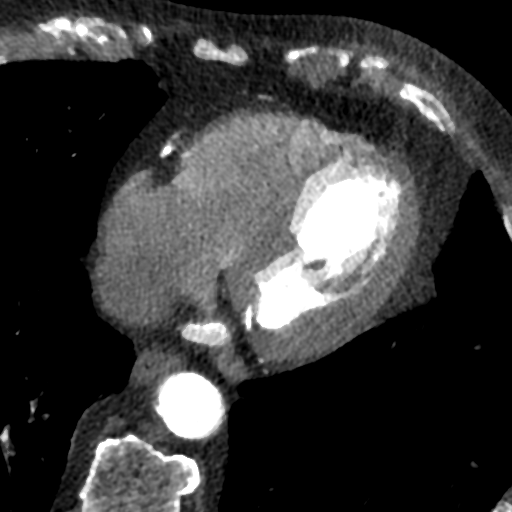
[im 120/358  lung]
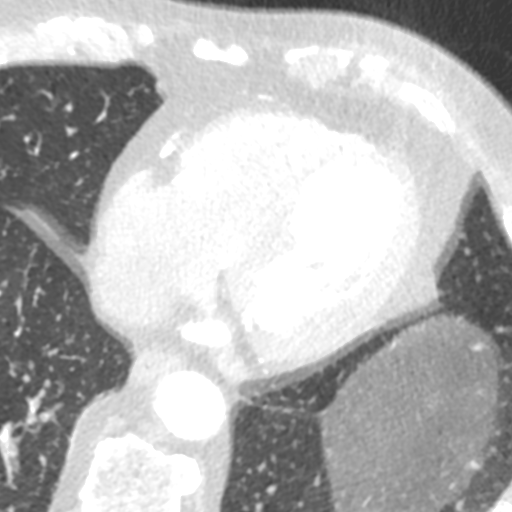
[im 239/358  vessel]
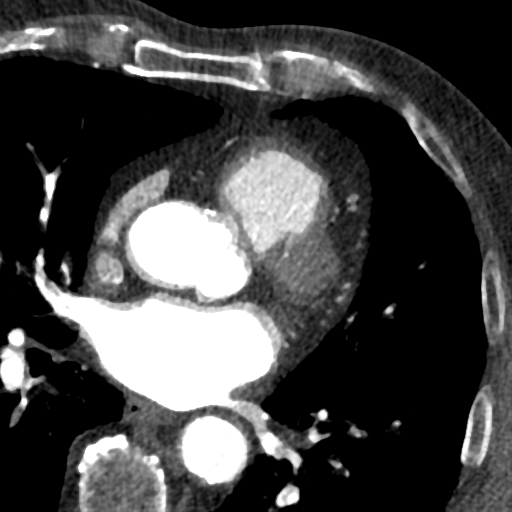

[Series 7: best syst 40 % · axial · 0.39mm/px · z∈[+1248,+1296]mm · 2 of 358 slices shown]
[im 120/358  vessel]
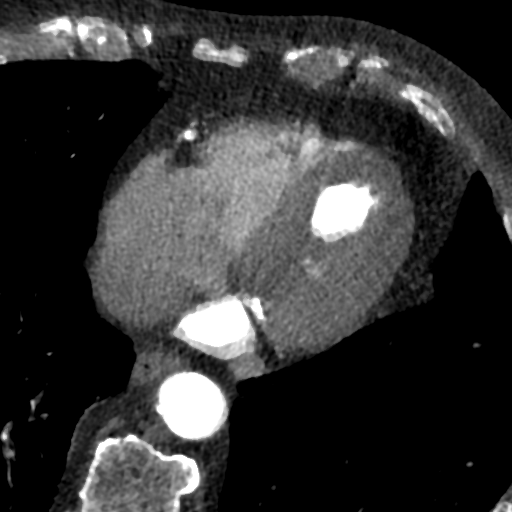
[im 239/358  vessel]
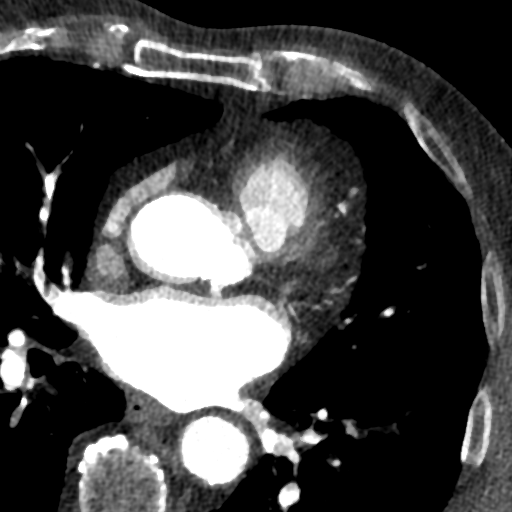

[Series 8: ts diast sharp 66 % · axial · 0.39mm/px · z∈[+1248,+1296]mm · 2 of 358 slices shown]
[im 120/358  lung]
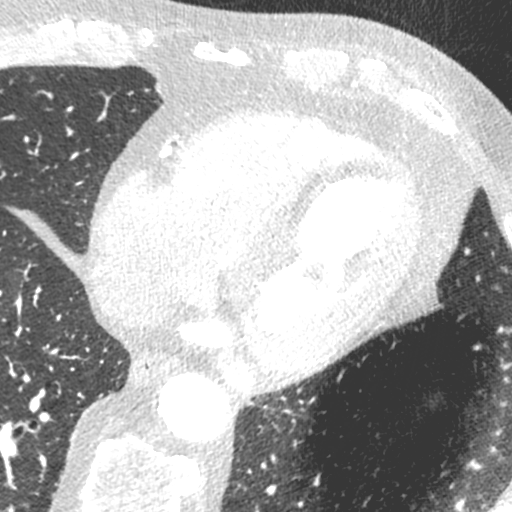
[im 239/358  lung]
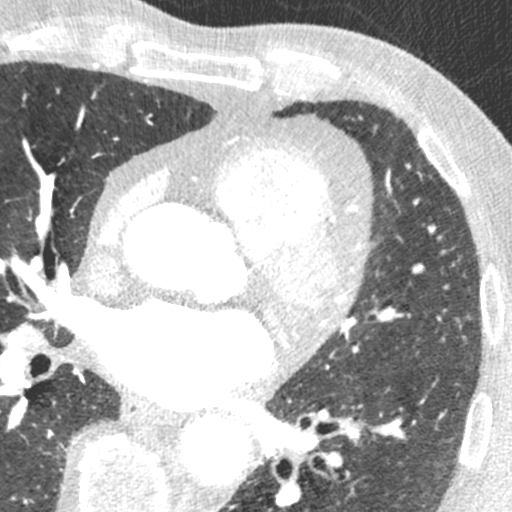

[Series 9: ts syst sharp 40 % · axial · 0.39mm/px · z∈[+1248,+1296]mm · 2 of 358 slices shown]
[im 120/358  lung]
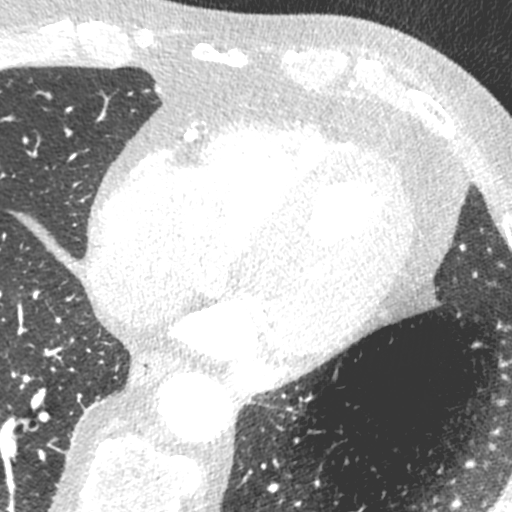
[im 239/358  lung]
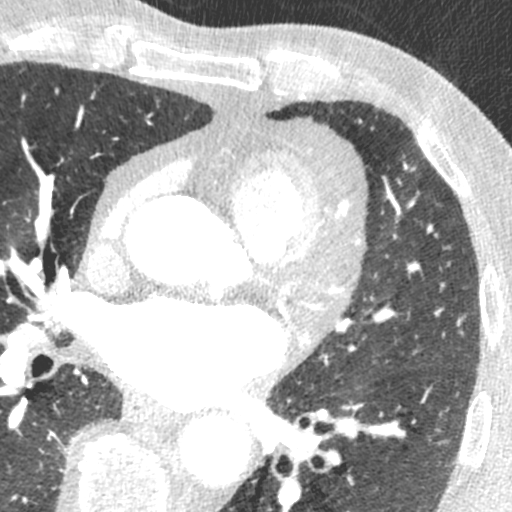

[8 of 20 positions shown; findings below may reference images not displayed]



Aorta: Normal size. Minimal atheroma and calcifications. No
dissection.

Aortic Valve:  Trileaflet.  No calcifications.

Coronary Arteries:  Normal coronary origin.  Right dominance.

RCA is a large dominant artery that gives rise to PDA and PLA. There
is a moderate diffuse mixed plaque with suspicion for a severe,
focal > 70% stenosis in the mid RCA.

Left main is a long and large artery that gives rise to LAD and LCX
arteries. Left main has no plaque.

LAD is a large vessel that has moderate diffuse predominantly
calcified plaque. Mid LAD is affected by motion however there
appears to be a focal stenosis of 50-69% but possibly > 70%
stenosis.

LCX is a very small non-dominant artery that gives rise to two small
diagonal arteries, there is no significant plaque.

Other findings:

Normal pulmonary vein drainage into the left atrium.

Normal let atrial appendage without a thrombus.

Normal size of the pulmonary artery.
IMPRESSION: 1. Coronary calcium score of 6830. This was 79 percentile for age
and sex matched control.

2. Normal coronary origin with right dominance.

3. Moderate to severe diffuse plaque. The study is affected by
motion, however there is a suspicion for a severe stenosis in the
mid RCA and LAD. A cardiac catheterization is recommended.

EXAM:
OVER-READ INTERPRETATION  CT CHEST

The following report is an over-read performed by radiologist Dr.
Sharell Mara [REDACTED] on 10/05/2017. This
over-read does not include interpretation of cardiac or coronary
anatomy or pathology. The coronary calcium score/coronary CTA
interpretation by the cardiologist is attached.
FINDINGS: Aortic atherosclerosis. Small calcified granuloma in the right
middle lobe incidentally noted. Within the visualized portions of
the thorax there are no suspicious appearing pulmonary nodules or
masses, there is no acute consolidative airspace disease, no pleural
effusions, no pneumothorax and no lymphadenopathy. Visualized
portions of the upper abdomen are unremarkable. Surgical clips
adjacent to the distal esophagus, potentially from prior vagotomy.
There are no aggressive appearing lytic or blastic lesions noted in
the visualized portions of the skeleton.
IMPRESSION: 1.  Aortic Atherosclerosis ([id number]).

## 2018-12-17 DIAGNOSIS — R338 Other retention of urine: Secondary | ICD-10-CM | POA: Diagnosis not present

## 2018-12-19 ENCOUNTER — Telehealth: Payer: Self-pay

## 2018-12-19 ENCOUNTER — Other Ambulatory Visit: Payer: Self-pay

## 2018-12-19 MED ORDER — ACCU-CHEK GUIDE VI STRP: ORAL_STRIP | 0 refills | Status: DC

## 2018-12-19 NOTE — Telephone Encounter (Signed)
Attempted again to contact the patient #[phone] discuss facility placement. Message left requesting a call back to this CM # [phone]

## 2018-12-24 ENCOUNTER — Telehealth: Payer: Self-pay | Admitting: Family Medicine

## 2018-12-24 ENCOUNTER — Other Ambulatory Visit: Payer: Self-pay | Admitting: Nurse Practitioner

## 2018-12-24 DIAGNOSIS — Z742 Need for assistance at home and no other household member able to render care: Secondary | ICD-10-CM

## 2018-12-24 NOTE — Telephone Encounter (Signed)
Patient is requesting home care to come out and help him with his catheter. Wanting someone to help him Monday, Wednesday, Friday. Patient says he cannot do anything with his left hand and really needs some help. Please contact patient.

## 2018-12-24 NOTE — Telephone Encounter (Signed)
[name] I placed a  Home health evaluation as well.

## 2018-12-25 ENCOUNTER — Telehealth: Payer: Self-pay

## 2018-12-25 NOTE — Telephone Encounter (Signed)
Referral received for home health services. Call placed to patient # [phone] to inquire if he has a preference for home health agencies and also discuss the possibility of ALF.  Message left with call back requested to this CM # [phone]. Call placed to daughter, [name] # [phone], message also left with call back requested to this CM

## 2018-12-25 NOTE — Telephone Encounter (Signed)
[place]

## 2018-12-28 IMAGING — CR DG CHEST 2V
2 series · 2 of 2 positions shown · non-contrast
Comparison: 10/04/2017 and cardiac CTA 10/05/2017

CLINICAL DATA: 77-year-old with anxiety.

EXAM:
CHEST - 2 VIEW

[w chest pa]
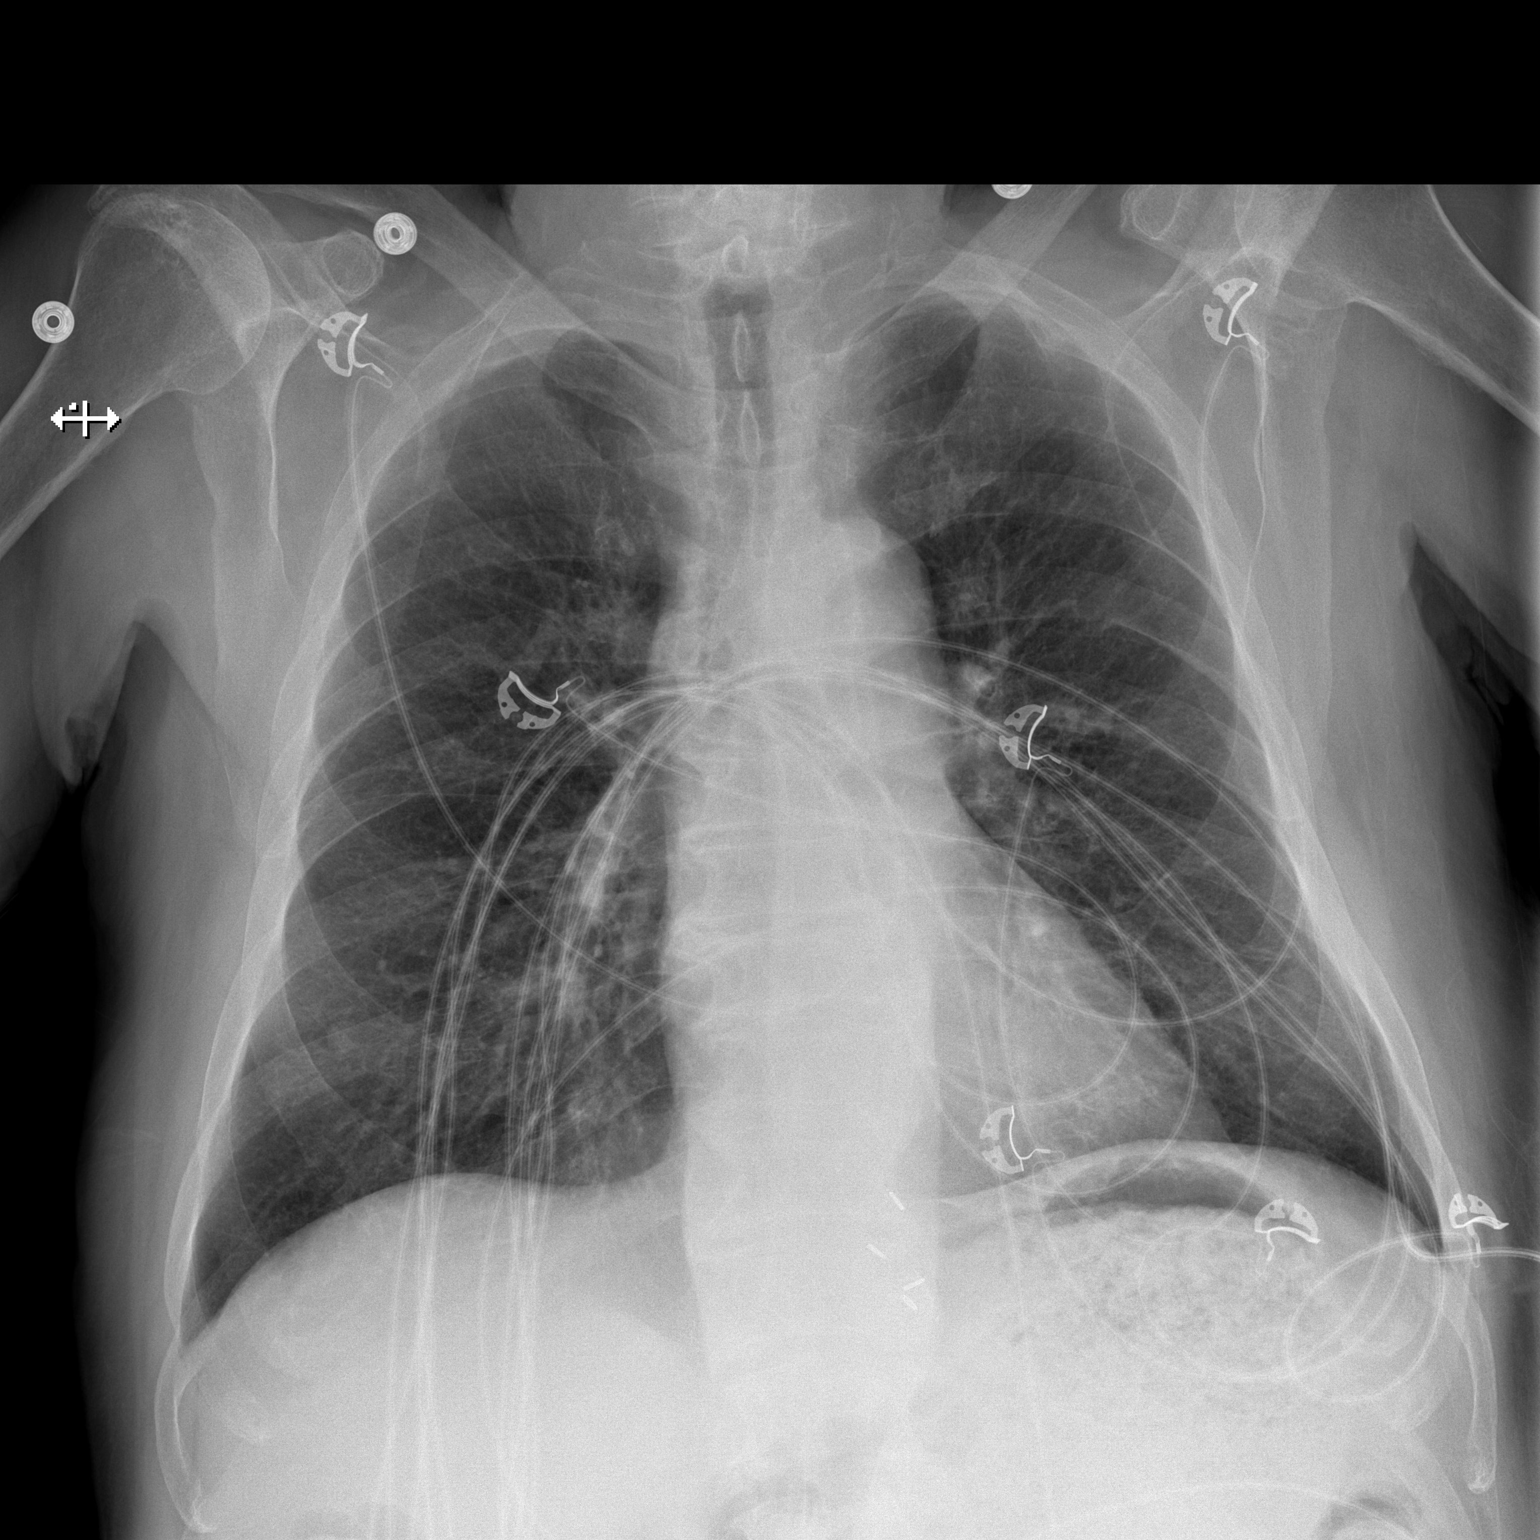

[w chest lat]
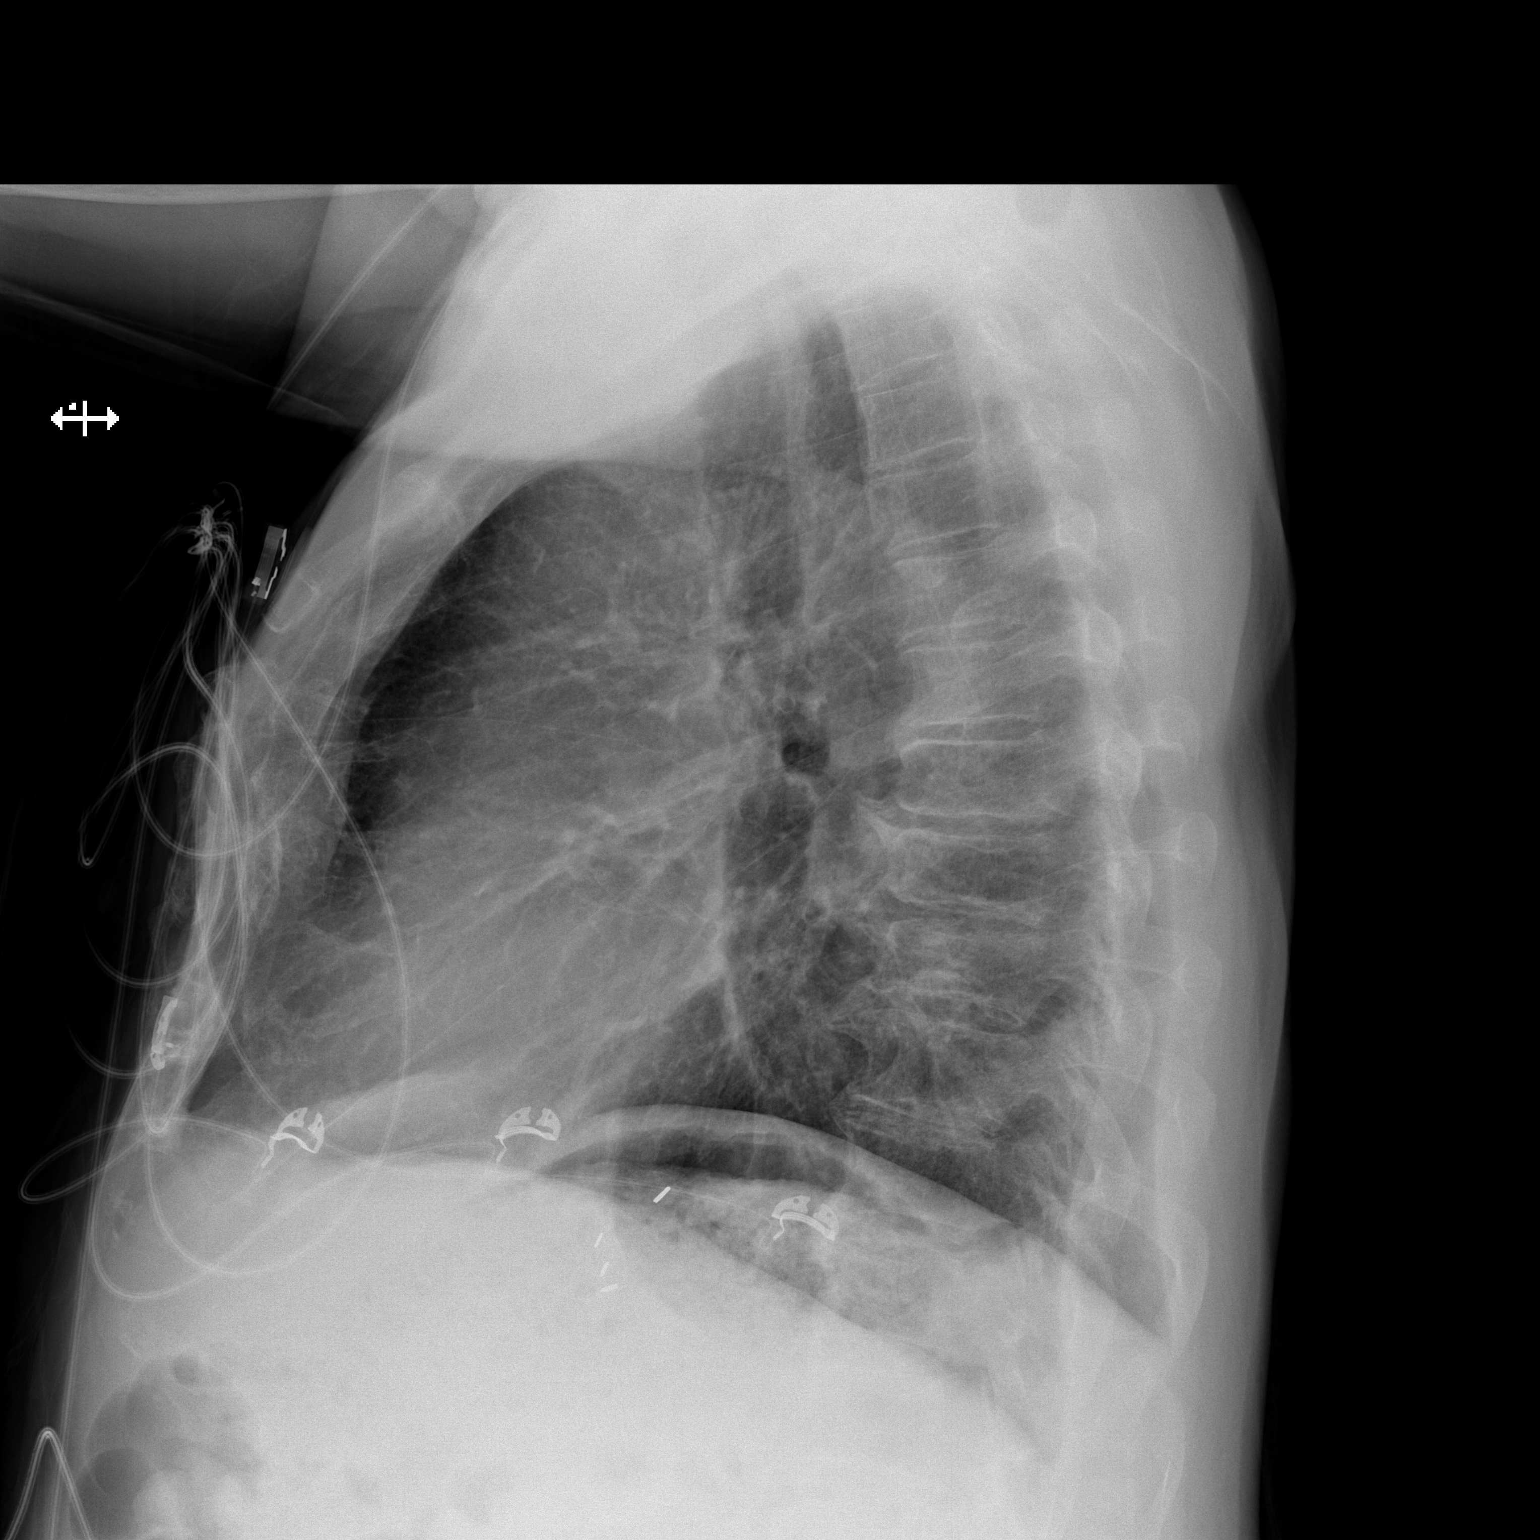

[2 of 2 positions shown; findings below may reference images not displayed]

FINDINGS: Lungs are clear. Heart and mediastinum are within normal limits.
Trachea is midline. No significant pleural effusions. Mild
degenerative changes in the thoracic spine. Negative for a
pneumothorax. Small nodular densities in the lower chest bilaterally
and most compatible with nipple shadows.
IMPRESSION: No active cardiopulmonary disease.

## 2018-12-29 NOTE — Telephone Encounter (Signed)
Thank you :)

## 2019-01-01 NOTE — Telephone Encounter (Signed)
NOTED

## 2019-01-02 ENCOUNTER — Ambulatory Visit: Payer: Medicare Other

## 2019-01-02 ENCOUNTER — Telehealth: Payer: Self-pay

## 2019-01-02 NOTE — Telephone Encounter (Signed)
Referral received for home health services. Attempted to contact patient again  # [phone] to inquire if he has a preference for home health agencies and also discuss the possibility of ALF.  Message left with call back requested to this CM # [phone]. Call placed to daughter, [name] # [phone], message also left with call back requested to this CM

## 2019-01-08 IMAGING — CR DG CHEST 2V
2 series · 2 of 2 positions shown · non-contrast
Comparison: 10/17/2017

CLINICAL DATA: Cough and shortness of breath for 3 weeks.

EXAM:
CHEST - 2 VIEW

[chest lat]
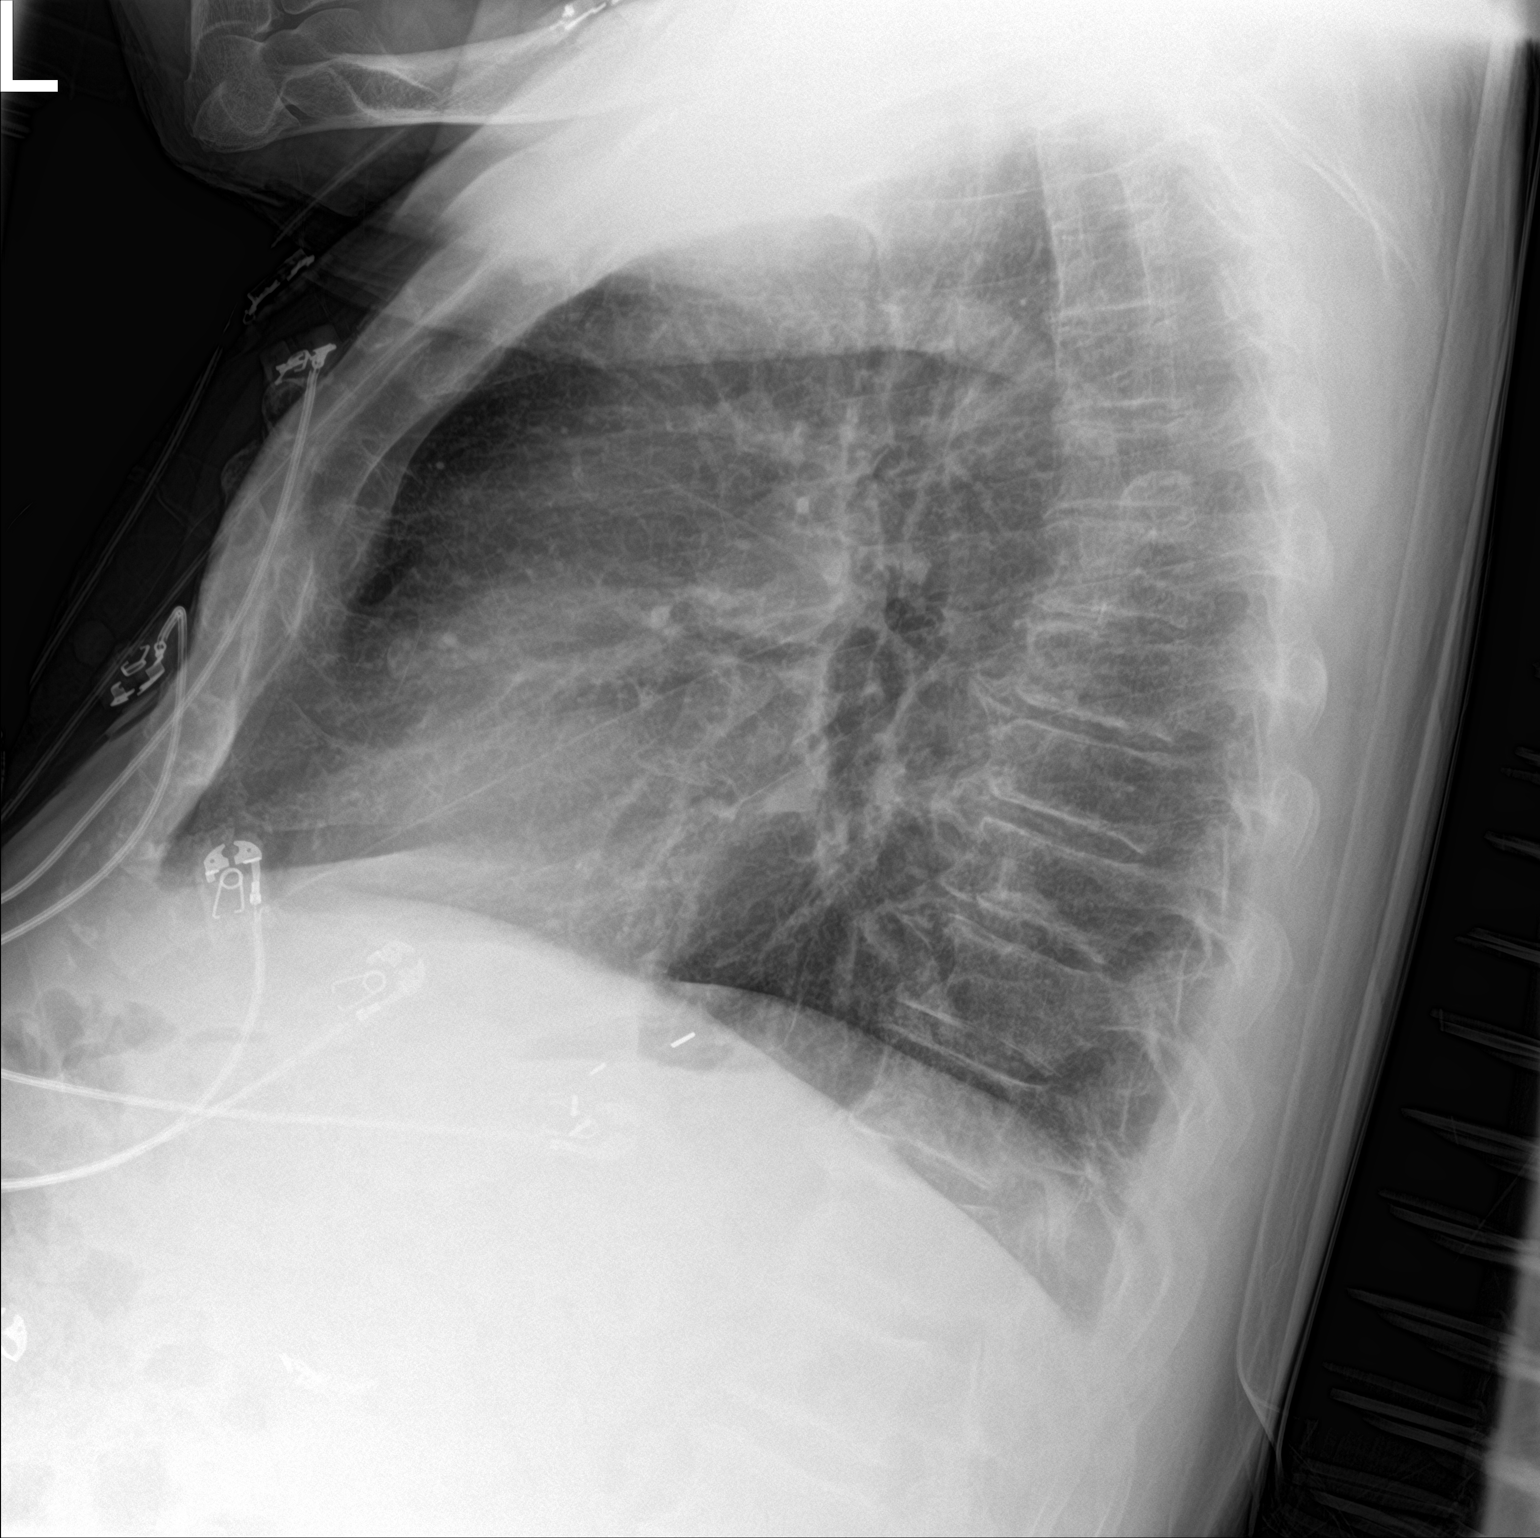

[chest ap]
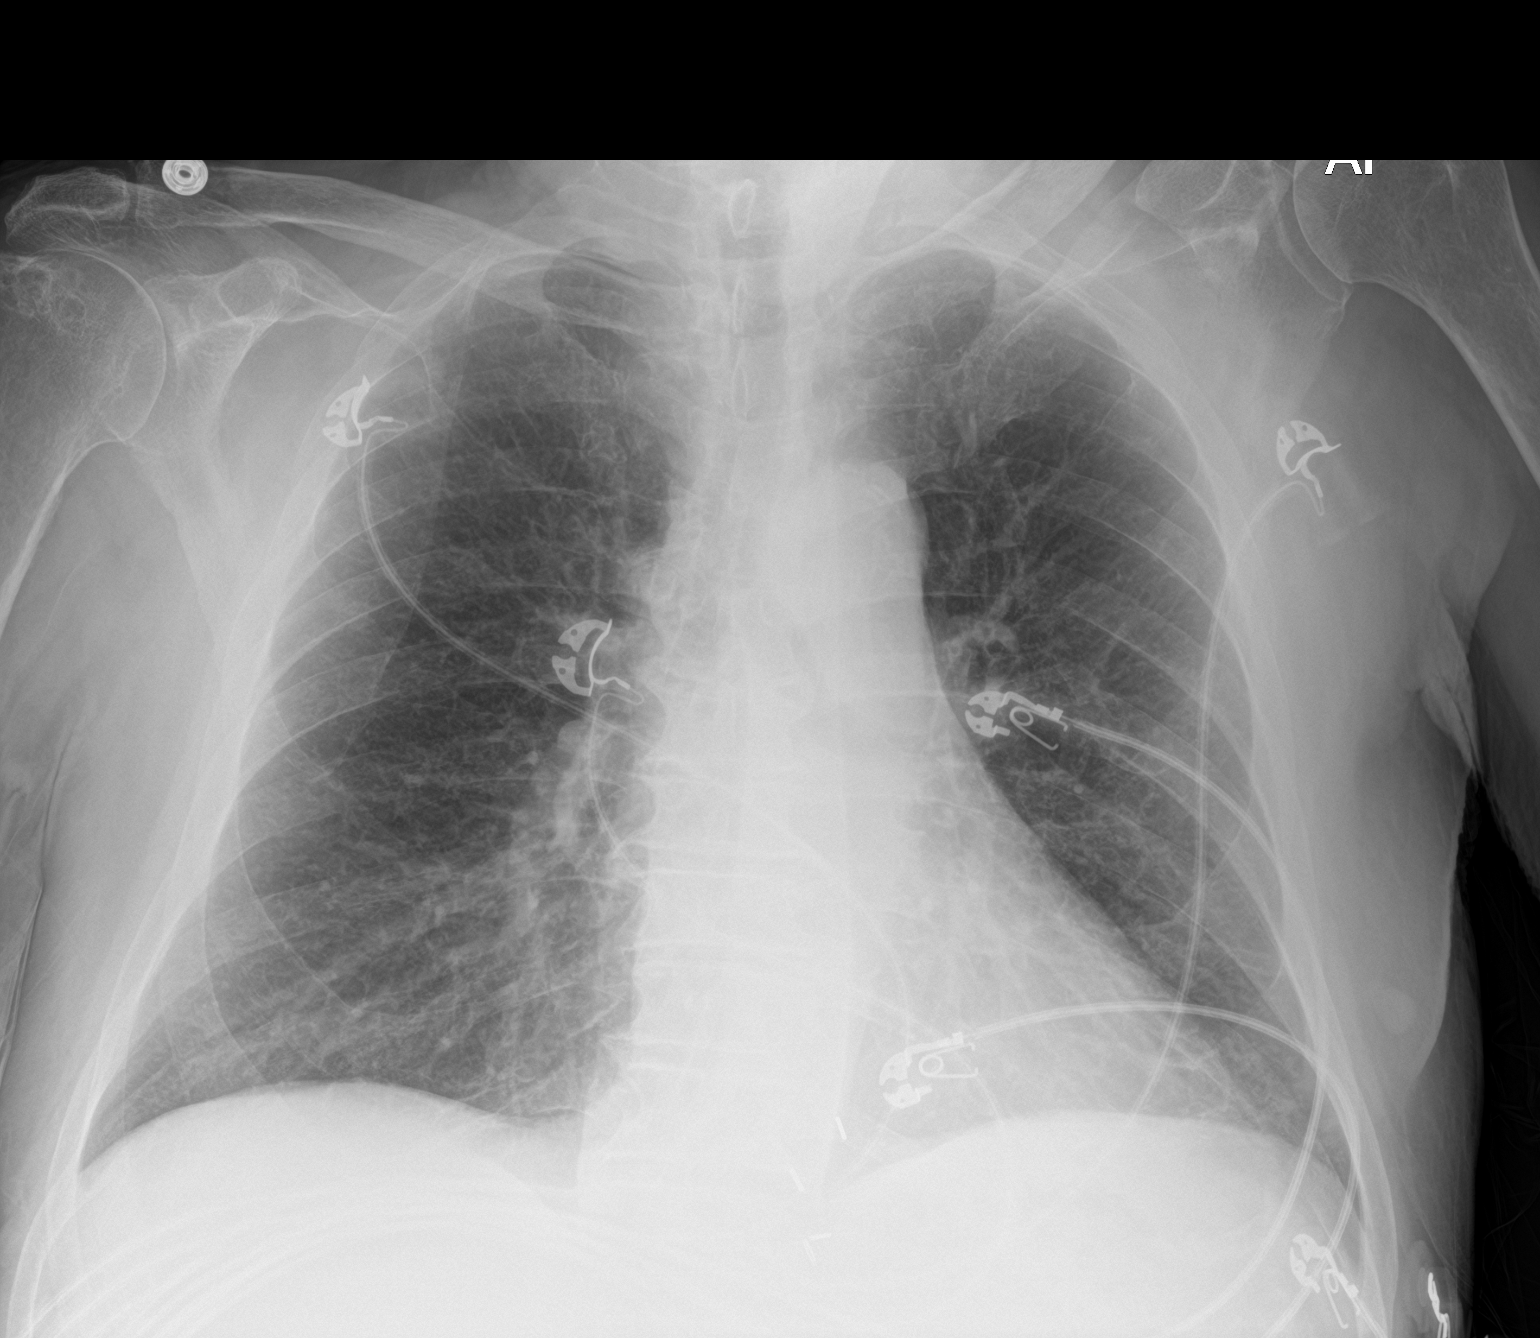

[2 of 2 positions shown; findings below may reference images not displayed]

FINDINGS: The heart size and mediastinal contours are within normal limits.
Aortic atherosclerosis. Both lungs are clear. No evidence of pleural
effusion. Surgical clips noted in the epigastric region. Mild mid
and lower thoracic spine degenerative disc disease incidentally
noted.
IMPRESSION: Stable exam.  No active cardiopulmonary disease.

## 2019-01-15 IMAGING — CR DG CHEST 2V
2 series · 2 of 2 positions shown · non-contrast
Comparison: 10/28/2017

CLINICAL DATA: Atrial fibrillation

EXAM:
CHEST - 2 VIEW

[chest pa]
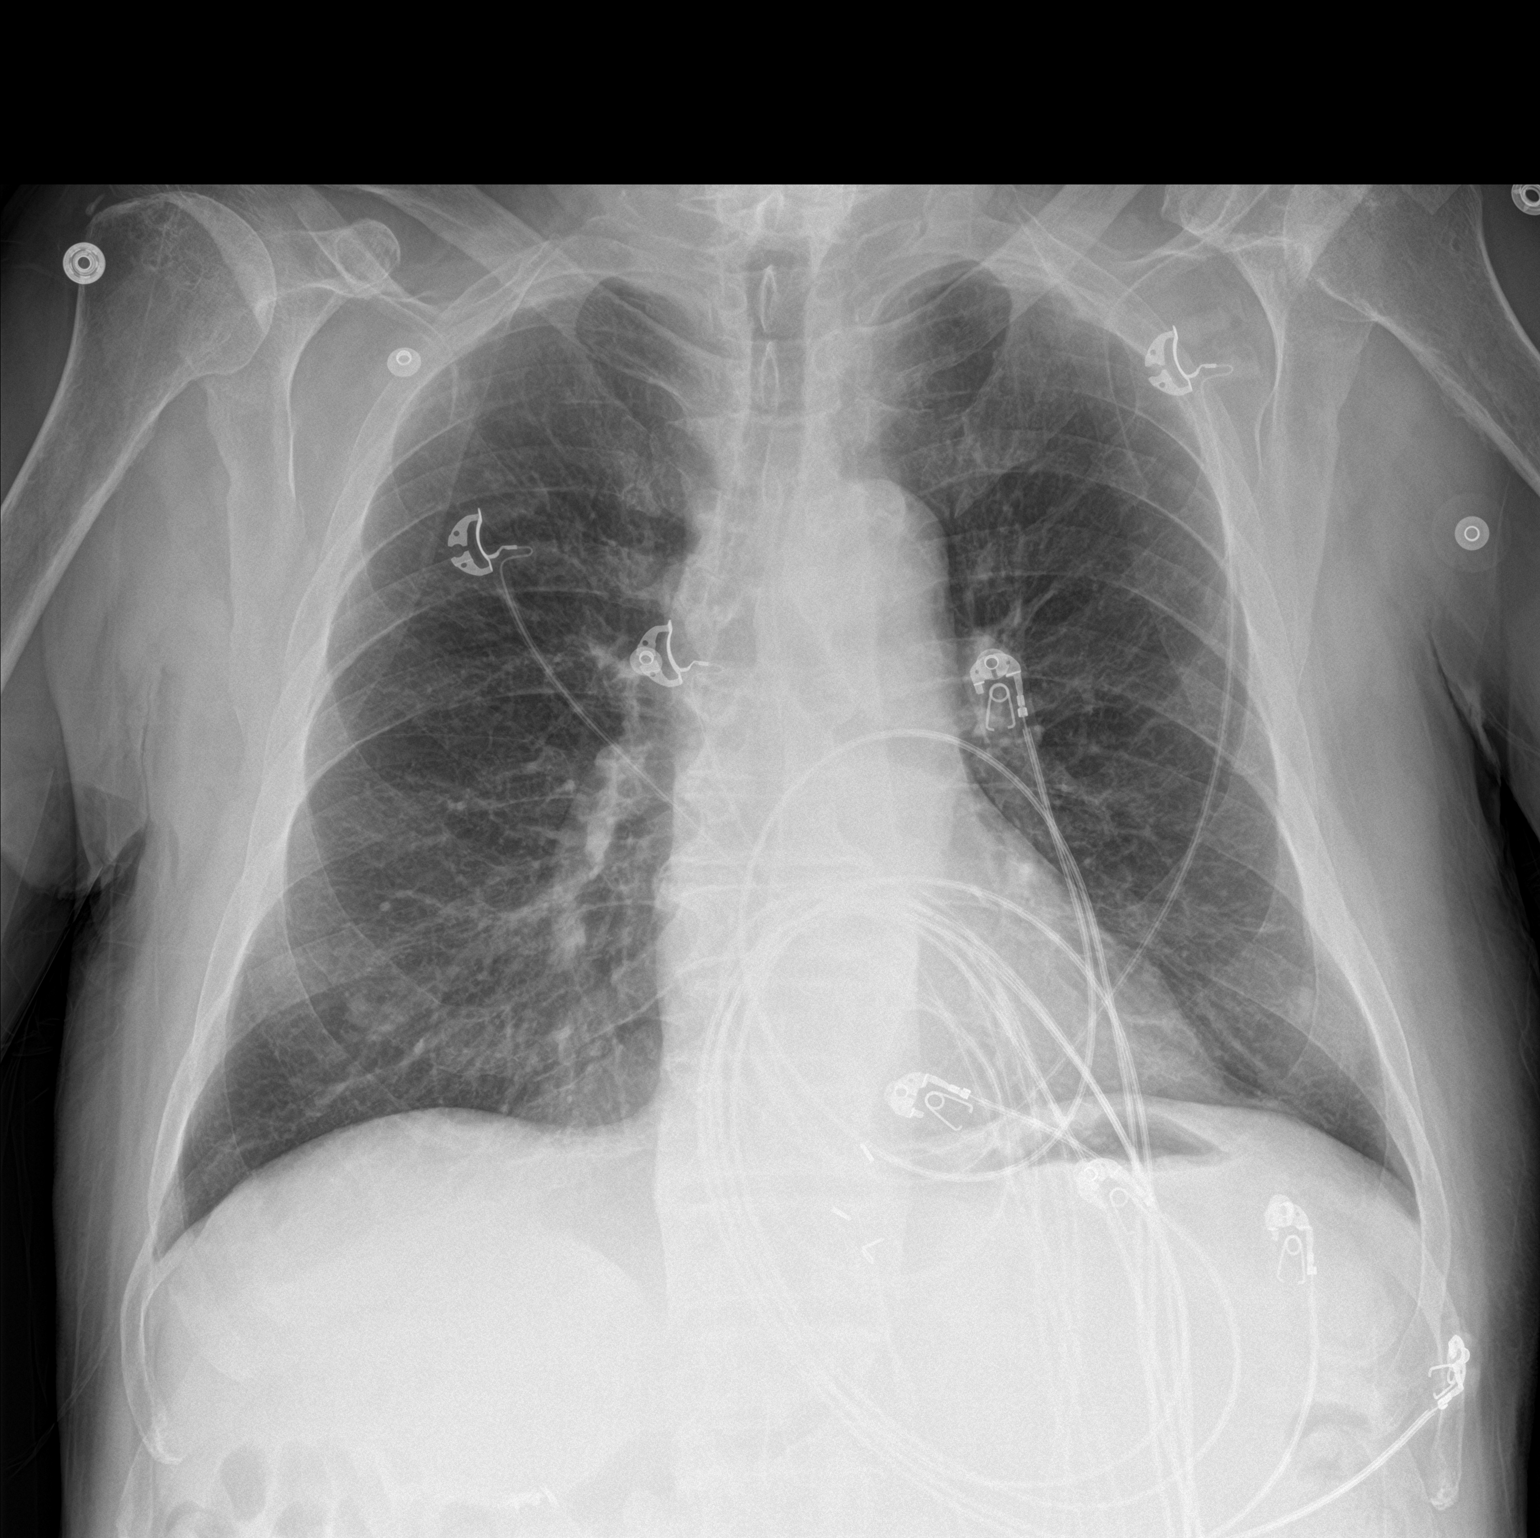

[chest lat]
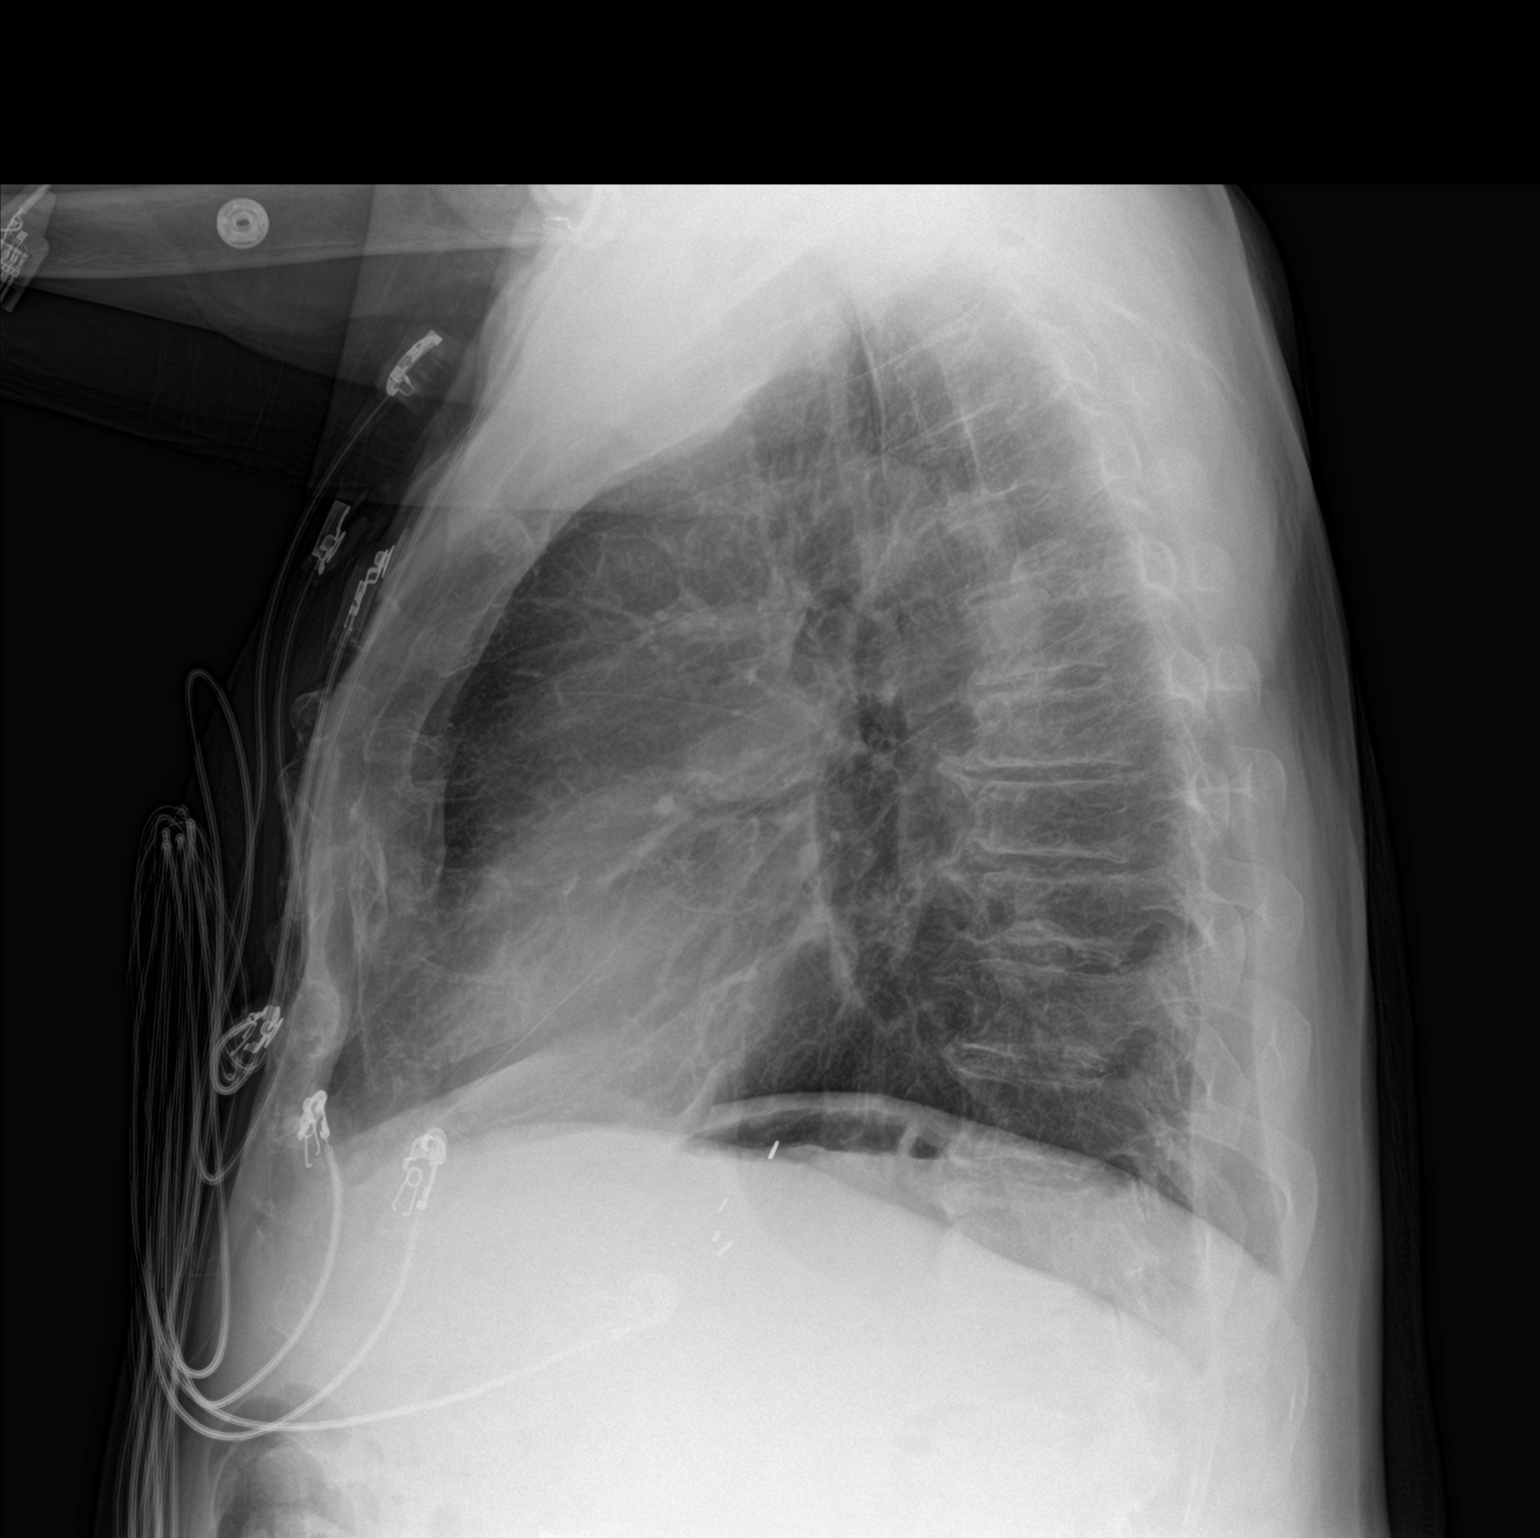

[2 of 2 positions shown; findings below may reference images not displayed]

FINDINGS: The heart size and mediastinal contours are within normal limits.
Minimal thoracic aortic atherosclerosis, stable in appearance. Mild
emphysematous hyperinflation of the lungs without acute pulmonary
consolidation, CHF, effusion or pneumothorax. The visualized
skeletal structures are unremarkable.
IMPRESSION: No active pulmonary disease. Aortic atherosclerosis. Pulmonary
hyperinflation

## 2019-01-16 ENCOUNTER — Telehealth: Payer: Self-pay

## 2019-01-16 DIAGNOSIS — R338 Other retention of urine: Secondary | ICD-10-CM | POA: Diagnosis not present

## 2019-01-16 NOTE — Telephone Encounter (Signed)
Letter sent to patient instructing him to contact this CM if he has questions about home health, community services as this CM has not been able to reach him,

## 2019-01-21 ENCOUNTER — Ambulatory Visit (INDEPENDENT_AMBULATORY_CARE_PROVIDER_SITE_OTHER): Payer: Medicare Other | Admitting: Cardiology

## 2019-01-21 ENCOUNTER — Other Ambulatory Visit: Payer: Self-pay

## 2019-01-21 ENCOUNTER — Encounter: Payer: Self-pay | Admitting: Cardiology

## 2019-01-21 VITALS — BP 88/48 | HR 74 | Ht 66.0 in | Wt 171.2 lb

## 2019-01-21 DIAGNOSIS — I4819 Other persistent atrial fibrillation: Secondary | ICD-10-CM | POA: Diagnosis not present

## 2019-01-21 MED ORDER — DILTIAZEM HCL ER COATED BEADS 120 MG PO CP24: 120.0000 mg | ORAL_CAPSULE | Freq: Two times a day (BID) | ORAL | 3 refills | Status: DC

## 2019-01-21 NOTE — Patient Instructions (Addendum)
Medication Instructions:  Your physician has recommended you make the following change in your medication: CHANGE Diltiazem to 120 mg TWICE a day  *If you need a refill on your cardiac medications before your next appointment, please call your pharmacy*  Labwork: None ordered  Testing/Procedures: None ordered  Follow-Up: Remote monitoring is used to monitor your Pacemaker or ICD from home. This monitoring reduces the number of office visits required to check your device to one time per year. It allows [place] to keep an eye on the functioning of your device to ensure it is working properly. You are scheduled for a device check from home on 04/21/2019. You may send your transmission at any time that day. If you have a wireless device, the transmission will be sent automatically. After your physician reviews your transmission, you will receive a postcard with your next transmission date.  You are scheduled for a 3 month follow up on 04/17/2019 @ 9:30 am with Dr. Curt Bears.    Thank you for choosing [facility]!!   Trinidad Curet, RN [phone]  Any Other Special Instructions Will Be Listed Below (If Applicable).

## 2019-01-21 NOTE — Progress Notes (Signed)
Electrophysiology Office Note   Date:  01/21/2019   ID:  [name], DOB June 12, 1940, MRN [id number]  PCP:  Scot Jun, FNP  Cardiologist:  [name] Primary Electrophysiologist:  Chanah Tidmore Meredith Leeds, MD    No chief complaint on file.    History of Present Illness: [name] is a 78 y.o. male who is being seen today for the evaluation of atrial flutter at the request of Rosaria Ferries. Presenting today for electrophysiology evaluation.  He has a history of diastolic heart failure, COPD, diabetes, hypertension, hyperlipidemia, paroxysmal atrial fibrillation and atrial flutter status post ablation, GI bleeding not anticoagulated.  He had a catheterization 10/05/2017 with nonobstructive coronary disease.  He has had multiple emergency room visits for atrial fibrillation.  He is tolerating his 240 mg of diltiazem without major issue.  He has had a few episodes where he has tachycardia and has to take his 30 mg dose.  His main symptoms are palpitations, weakness, and fatigue.  He continues to smoke up to 4 cigarettes a day.  He had an episode of syncope and is now status post Medtronic dual-chamber pacemaker implanted 10/21/2018.   Today, denies symptoms of palpitations, chest pain, shortness of breath, orthopnea, PND, lower extremity edema, claudication, dizziness, presyncope, syncope, bleeding, or neurologic sequela. The patient is tolerating medications without difficulties.  Since his pacemaker implant, he has done well.  He has much less weakness and fatigue.  Despite that, he is on high doses of diltiazem and has had some fast rates.  His blood pressures have been low as well.  Past Medical History:  Diagnosis Date  . Allergic rhinitis   . Anemia    takes Ferrous Sulfate daily  . Arthritis    "all over"  . Atrial flutter ([place])   . Balance problem 01/2014  . CAP (community acquired pneumonia) 09/18/2014  . Cervical radiculopathy due to degenerative joint disease of spine    . Chronic coronary artery disease    a. 03/2010 Nonocclusive disease by cath, performed for ST elevations on ECG;  b. 06/2013 Lexi MV: EF 60%, no ischemia.  [name] COPD (chronic obstructive pulmonary disease) ([place])   . Diabetes mellitus type II    takes Metformin and Lantus daily  . Diastolic CHF, chronic ([place])    a. 12/2012 EF 55-60%, diast dysfxn, triv MR, mildly dil LA/RA.  [name] Dyspnea    with pain  . Essential hypertension    takes Diltiazem daily  . Frail elderly   . GERD (gastroesophageal reflux disease)   . History of blood transfusion 1982   "when I had stomach OR"  . History of bronchitis    1998  . History of CVA (cerebrovascular accident)   . History of gastric ulcer   . History of kidney stones   . Hypercholesterolemia    takes Pravastatin daily  . Intrinsic eczema   . Legally blind in left eye, as defined in [place]   . Obesity   . PAF (paroxysmal atrial fibrillation) ([facility])    Recurrent after atrial flutter (a. 07/2010 Status post caval tricuspid isthmus ablation by Dr. Midge Aver Metoprolol daily), currently controlled on flecainide plus diltiazem and Coumadin  . Urinary retention   . Vitamin D deficiency   . Weakness    numbness and tingling both hands   Past Surgical History:  Procedure Laterality Date  . ATRIAL ABLATION SURGERY  08/05/10   CTI ablation for atrial flutter by [facility]  . CARDIAC CATHETERIZATION  2012   nl  LV function, no occlusive CAD, PAF  . CARDIOVERSION  12/07/2010    Successful direct current cardioversion with atrial fibrillation to normal sinus rhythm  . CARPAL TUNNEL RELEASE Bilateral 01/30/2014   Procedure: BILATERAL CARPAL TUNNEL RELEASE;  Surgeon: Marianna Payment, MD;  Location: [facility];  Service: Orthopedics;  Laterality: Bilateral;  . CATARACT EXTRACTION W/ INTRAOCULAR LENS  IMPLANT, BILATERAL Bilateral   . COLONOSCOPY [name] 12/02/2013   Procedure: COLONOSCOPY;  Surgeon: Irene Shipper, MD;  Location: [facility];  Service: Endoscopy;  Laterality:  [name];  . ESOPHAGOGASTRODUODENOSCOPY [name] 09/22/2014   Procedure: ESOPHAGOGASTRODUODENOSCOPY (EGD);  Surgeon: Ronald Lobo, MD;  Location[facility] END[facility]COPY;  Service: Endoscopy;  Laterality: [name];  . ESOPHAGOGASTRODUODENOSCOPY [name] 07/11/2016   Procedure: ESOPHAGOGASTRODUODENOSCOPY (EGD);  Surgeon: Doran Stabler, MD;  Location: [facility] ENDOSCOPY;  Service: Endoscopy;  Laterality: [name];  . EXTRACORPOREAL SHOCK WAVE LITHOTRIPSY Right 07/15/2018   Procedure: EXTRACORPOREAL SHOCK WAVE LITHOTRIPSY (ESWL);  Surgeon: Lucas Mallow, MD;  Location: [facility];  Service: Urology;  Laterality: Right;  . INCISION AND DRAINAGE ABSCESS / HEMATOMA OF BURSA / KNEE / THIGH Left 1998   knee  . KNEE BURSECTOMY Left 1998  . LAPAROSCOPIC CHOLECYSTECTOMY  03/2010  . LEFT HEART CATH AND CORONARY ANGIOGRAPHY [name] 10/05/2017   Procedure: LEFT HEART CATH AND CORONARY ANGIOGRAPHY;  Surgeon: Martinique, Peter M, MD;  Location: [facility] CV LAB;  Service: Cardiovascular;  Laterality: [name];  . NM MYOVIEW LTD  07/22/2013   Normal EF ~60%, no ischemia or infarction.  [name] PACEMAKER IMPLANT [name] 10/21/2018   Procedure: PACEMAKER IMPLANT;  Surgeon: Deboraha Sprang, MD;  Location: [facility] CV LAB;  Service: Cardiovascular;  Laterality: [name];  . PARTIAL GASTRECTOMY  1982   subtotal; "took out 30% for ulcers"  . TRANSTHORACIC ECHOCARDIOGRAM  02/16/2014   EF 60%, no RWMA. - otherwise normal  . YAG LASER APPLICATION Bilateral      Current Outpatient Medications  Medication Sig Dispense Refill  . Accu-Chek FastClix Lancets MISC USE TO TEST BLOOD SUGAR TWICE DAILY. E11.00 102 each 0  . aspirin EC 81 MG tablet Take 81 mg by mouth daily.    . citalopram (CELEXA) 20 MG tablet Take 1 tablet (20 mg total) by mouth daily. 90 tablet 1  . diclofenac sodium (VOLTAREN) 1 % GEL APPLY 2 GRAMS TOPICALLY 2 TIMES DAILY.    [name] diltiazem (CARDIZEM CD) 360 MG 24 hr capsule Take 1 capsule (360 mg total) by mouth daily. 90 capsule 0  . ferrous sulfate 325 (65 FE) MG  tablet Take 325 mg by mouth daily with breakfast.    . glimepiride (AMARYL) 2 MG tablet Take 1 tablet (2 mg total) by mouth daily before breakfast. 30 tablet 3  . glucose blood (ACCU-CHEK GUIDE) test strip USE TO TEST BLOOD SUGAR TWICE DAILY. E11.00 100 each 0  . nitroGLYCERIN (NITROSTAT) 0.4 MG SL tablet Place 1 tablet (0.4 mg total) under the tongue every 5 (five) minutes x 3 doses as needed for chest pain. 25 tablet 9  . pantoprazole (PROTONIX) 40 MG tablet Take 1 tablet (40 mg total) by mouth 2 (two) times daily before a meal. 180 tablet 1  . pravastatin (PRAVACHOL) 40 MG tablet Take 1 tablet (40 mg total) by mouth every evening. 90 tablet 3  . tamsulosin (FLOMAX) 0.4 MG CAPS capsule Take 1 capsule (0.4 mg total) by mouth daily. 30 capsule 3   No current facility-administered medications for this visit.     Allergies:  Cyclobenzaprine and Other   Social History:  The patient  reports that he has been smoking cigarettes. He has a 14.25 pack-year smoking history. He has never used smokeless tobacco. He reports that he does not drink alcohol or use drugs.   Family History:  The patient's family history includes Breast cancer in his mother; Diabetes in his mother; Heart attack in his brother and father; Heart disease in his father; Kidney disease in his mother; Leukemia in his daughter.   ROS:  Please see the history of present illness.   Otherwise, review of systems is positive for none.   All other systems are reviewed and negative.   PHYSICAL EXAM: VS:  BP (!) 88/48   Pulse 74   Ht 5\' 6"  (1.676 m)   Wt 171 lb 3.2 oz (77.7 kg)   SpO2 95%   BMI 27.63 kg/m  , BMI Body mass index is 27.63 kg/m. GEN: Well nourished, well developed, in no acute distress  HEENT: normal  Neck: no JVD, carotid bruits, or masses Cardiac: Irregular; no murmurs, rubs, or gallops,no edema  Respiratory:  clear to auscultation bilaterally, normal work of breathing GI: soft, nontender, nondistended, + BS MS: no  deformity or atrophy  Skin: warm and dry, device site well healed Neuro:  Strength and sensation are intact Psych: euthymic mood, full affect  EKG:  EKG is ordered today. Personal review of the ekg ordered shows atrial flutter, rate 74  Personal review of the device interrogation today. Results in [place]: 04/03/2018: B Natriuretic Peptide 101.9 07/10/2018: ALT 13 10/20/2018: Magnesium 1.9; TSH 1.468 10/22/2018: BUN 12; Creatinine, Ser 0.96; Hemoglobin 11.1; Platelets 210; Potassium 3.7; Sodium 136    Lipid Panel     Component Value Date/Time   CHOL 135 10/02/2018 0933   TRIG 94 10/02/2018 0933   HDL 47 10/02/2018 0933   CHOLHDL 2.9 10/02/2018 0933   CHOLHDL 2.2 11/09/2017 0040   VLDL 27 11/09/2017 0040   LDLCALC 69 10/02/2018 0933     Wt Readings from Last 3 Encounters:  01/21/19 171 lb 3.2 oz (77.7 kg)  11/22/18 158 lb (71.7 kg)  11/14/18 160 lb (72.6 kg)      Other studies Reviewed: Additional studies/ records that were reviewed today include: LHC 10/05/17  Review of the above records today demonstrates:   Dist RCA lesion is 40% stenosed.  Ost 1st Sept lesion is 95% stenosed.  LV end diastolic pressure is normal.   1. Single vessel CAD involving the ostium of the first septal perforator. Otherwise nonobstructive disease. 2. Normal LVEDP  TTE 10/05/17 - Left ventricle: The cavity size was normal. Wall thickness was   increased in a pattern of mild LVH. Systolic function was normal.   The estimated ejection fraction was in the range of 55% to 60%. - Aortic valve: There was trivial regurgitation. - Mitral valve: There was mild regurgitation. - Left atrium: The atrium was mildly dilated.  ASSESSMENT AND PLAN:  1.  Atypical atrial flutter/atrial fibrillation: Has recurrent GI bleeds and thus is not anticoagulated.  Is now status post pacemaker.  He has some fast atrial rates, which appear to happen as his diltiazem is washing out.  His blood pressures  are also low.  We Turki Tapanes thus decrease his diltiazem dose and switch him to 120 mg twice a day.  If we are unable to control his heart rate, he may benefit from AV node ablation as well as stopping all blood pressure medications  as blood pressure is quite low today.  2.  Chronic diastolic heart failure: Signs of volume overload.  No changes.  3.  Tobacco abuse: Cessation encouraged  4.  Tachybradycardia syndrome: Status post Medtronic dual-chamber pacemaker.  Device functioning appropriately.  No changes.  Current medicines are reviewed at length with the patient today.   The patient does not have concerns regarding his medicines.  The following changes were made today: Decrease diltiazem  Labs/ tests ordered today include:  Orders Placed This Encounter  Procedures  . EKG 12-Lead     Disposition:   FU with [facility] Bourbon 3 months  Signed, Gionni Vaca Meredith Leeds, MD  01/21/2019 2:18 PM     [address] [place] [address] [phone] (office) [phone] (fax)

## 2019-01-23 IMAGING — DX DG CHEST 2V
2 series · 2 of 2 positions shown · non-contrast
Comparison: 11/07/2017

CLINICAL DATA: Atrial fibrillation with shortness of breath

EXAM:
CHEST - 2 VIEW

[x chest ap]
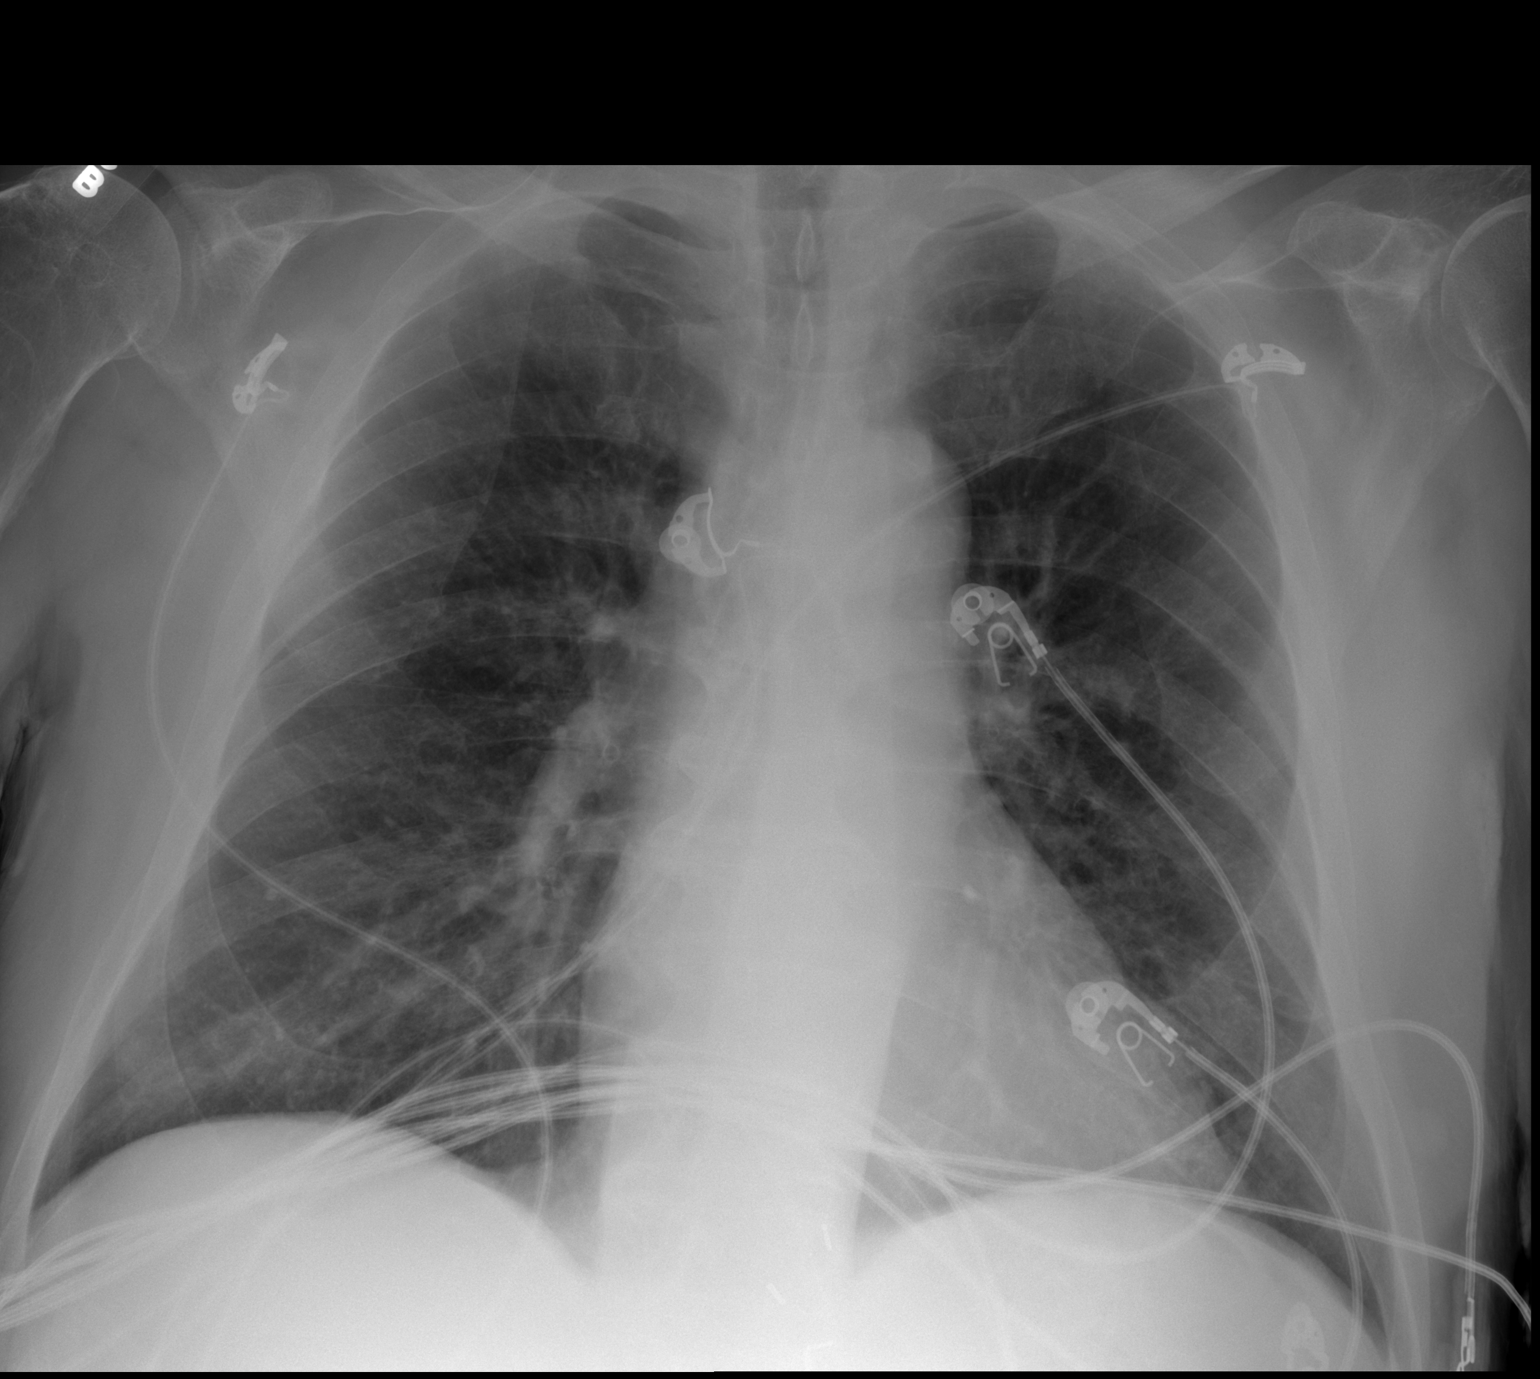

[w chest lat]
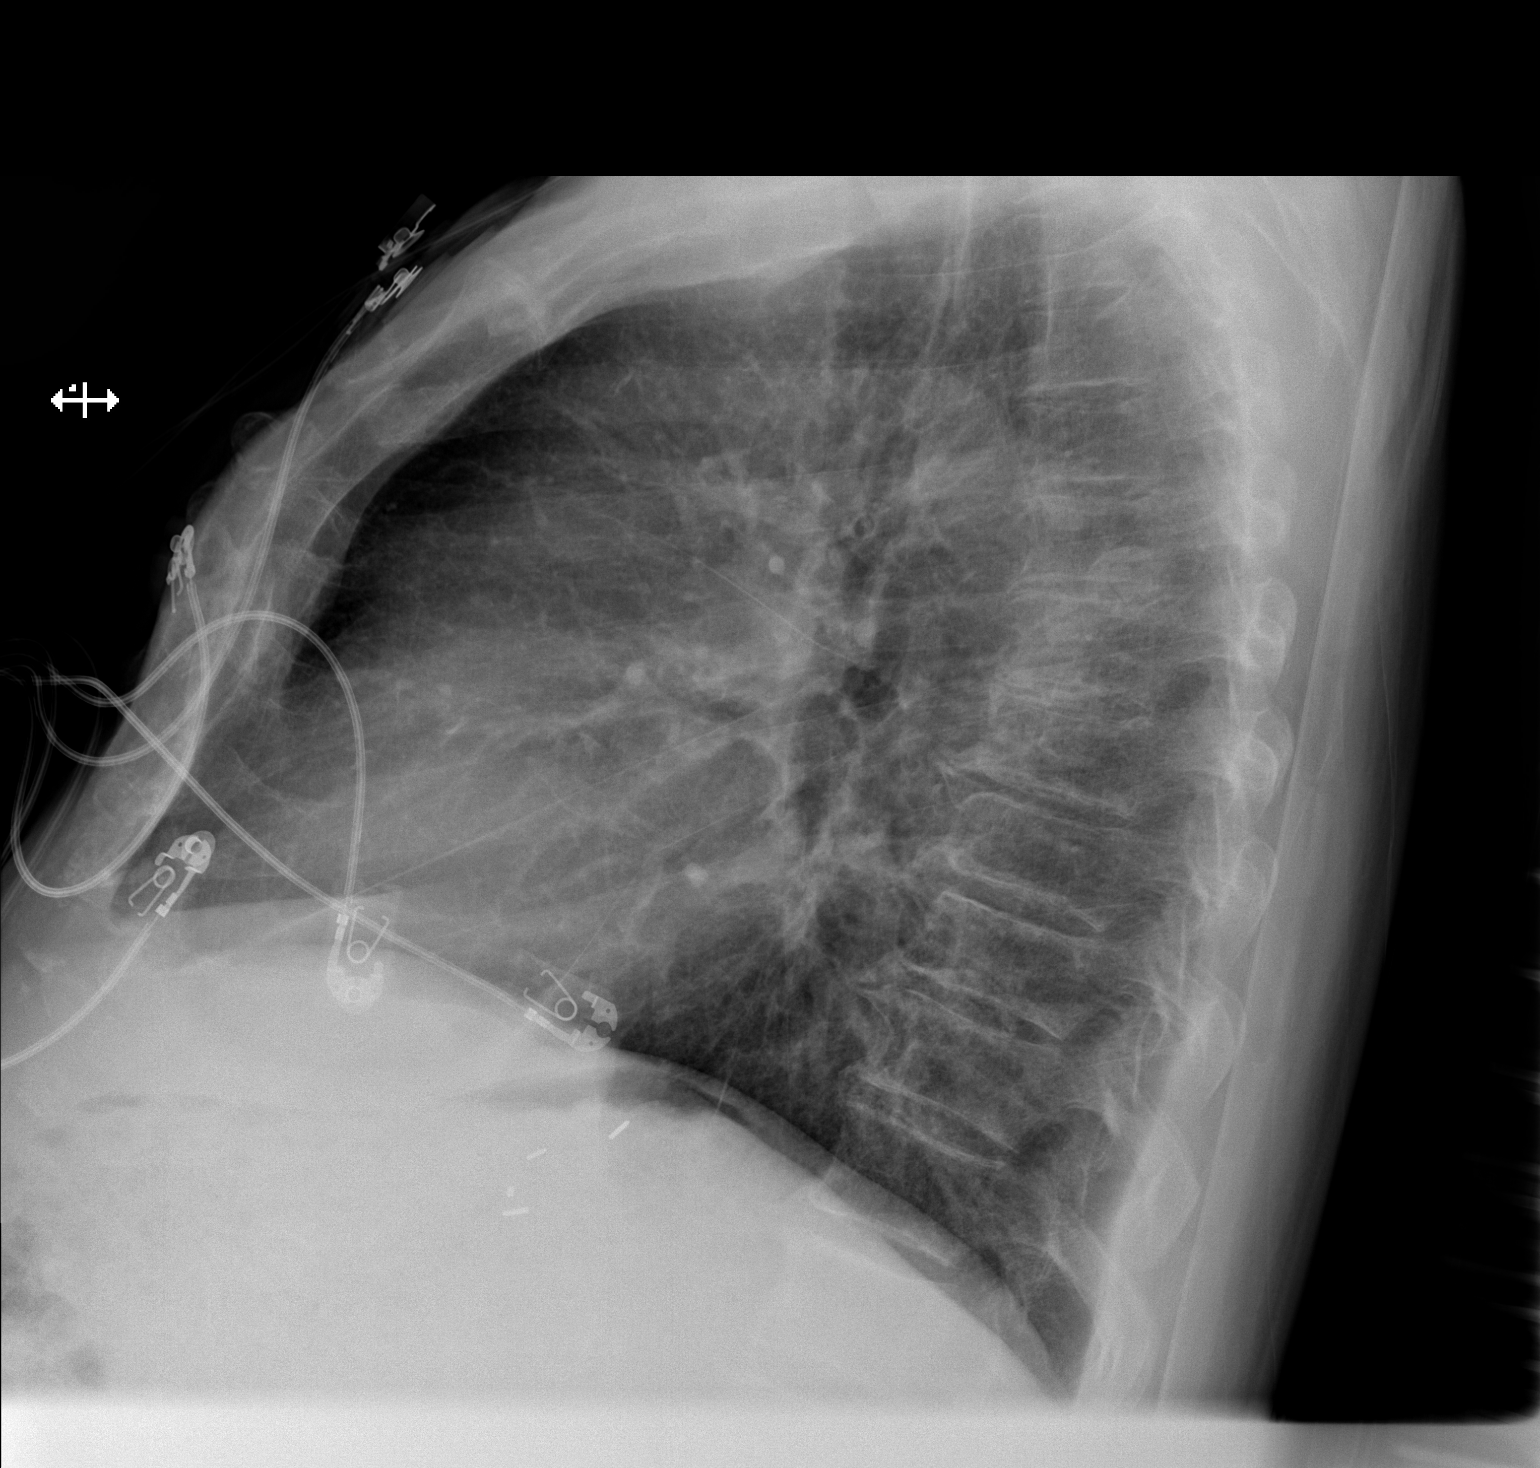

[2 of 2 positions shown; findings below may reference images not displayed]

FINDINGS: Normal heart size and mediastinal contours. Chronic interstitial
coarsening. There is no edema, consolidation, effusion, or
pneumothorax. Artifact from EKG leads.
IMPRESSION: Stable from prior.  No acute finding.

## 2019-02-13 DIAGNOSIS — R338 Other retention of urine: Secondary | ICD-10-CM | POA: Diagnosis not present

## 2019-02-18 ENCOUNTER — Encounter: Payer: Self-pay | Admitting: Internal Medicine

## 2019-02-18 ENCOUNTER — Ambulatory Visit (INDEPENDENT_AMBULATORY_CARE_PROVIDER_SITE_OTHER): Payer: Medicare Other | Admitting: Internal Medicine

## 2019-02-18 DIAGNOSIS — M130 Polyarthritis, unspecified: Secondary | ICD-10-CM

## 2019-02-18 MED ORDER — DICLOFENAC SODIUM 1 % EX GEL: 2.0000 g | Freq: Four times a day (QID) | CUTANEOUS | 4 refills | Status: DC

## 2019-02-18 NOTE — Progress Notes (Signed)
Requesting a refill on the Diclofenac gel. He was prescribed it by ED for arthritis in his lower back.

## 2019-02-18 NOTE — Progress Notes (Signed)
Virtual Visit via Telephone Note Due to current restrictions/limitations of in-office visits due to the COVID-19 pandemic, this scheduled clinical appointment was converted to a telehealth visit  I connected with [name] on 02/18/19 at 8:44 a.m by telephone and verified that I am speaking with the correct person using two identifiers. I am in my office.  The patient is at home.  Only the patient, daughter [name] and myself participated in this encounter.  I discussed the limitations, risks, security and privacy concerns of performing an evaluation and management service by telephone and the availability of in person appointments. I also discussed with the patient that there may be a patient responsible charge related to this service. The patient expressed understanding and agreed to proceed.   History of Present Illness: Patient with history of diastolic CHF, paroxysmal atrial fibrillation, a flutter (not on anticoagulation due to history of GI bleed), COPD, DM, HTN, HL, nonobstructive CAD, pacemaker, anemia of chronic disease, GI bleed  Pt requesting RF on Voltaren Gel for arthritis pains in knees, shoulders.  Pt states he was on it but someone decline RF because he could not make appt.    Advised that CW was trying to reach him to arrange [facility] and ALF. Pt lives with his daughter and son-in-law. Pt and daughter states he no longer needs either.  Daughter and son-in-law both work 2nd shift.  Pt by hisself at nights.  Daughter states pt has cell phone and calls her if he has any emergencies.  They both feel that because of the Covid pandemic he is better off remaining at home rather than going to assisted living.   Outpatient Encounter Medications as of 02/18/2019  Medication Sig  . Accu-Chek FastClix Lancets MISC USE TO TEST BLOOD SUGAR TWICE DAILY. E11.00  . aspirin EC 81 MG tablet Take 81 mg by mouth daily.  . citalopram (CELEXA) 20 MG tablet Take 1 tablet (20 mg total) by mouth daily.   . diclofenac sodium (VOLTAREN) 1 % GEL APPLY 2 GRAMS TOPICALLY 2 TIMES DAILY.  [name] diltiazem (CARDIZEM CD) 120 MG 24 hr capsule Take 1 capsule (120 mg total) by mouth 2 (two) times daily.  . ferrous sulfate 325 (65 FE) MG tablet Take 325 mg by mouth daily with breakfast.  . glimepiride (AMARYL) 2 MG tablet Take 1 tablet (2 mg total) by mouth daily before breakfast.  . glucose blood (ACCU-CHEK GUIDE) test strip USE TO TEST BLOOD SUGAR TWICE DAILY. E11.00  . pantoprazole (PROTONIX) 40 MG tablet Take 1 tablet (40 mg total) by mouth 2 (two) times daily before a meal.  . pravastatin (PRAVACHOL) 40 MG tablet Take 1 tablet (40 mg total) by mouth every evening.  . tamsulosin (FLOMAX) 0.4 MG CAPS capsule Take 1 capsule (0.4 mg total) by mouth daily.  . nitroGLYCERIN (NITROSTAT) 0.4 MG SL tablet Place 1 tablet (0.4 mg total) under the tongue every 5 (five) minutes x 3 doses as needed for chest pain. (Patient not taking: Reported on 02/18/2019)   No facility-administered encounter medications on file as of 02/18/2019.     Observations/Objective: No direct observation done as this was a telephone encounter.  Assessment and Plan: 1. Polyarthritis Refill given on diclofenac gel. - diclofenac Sodium (VOLTAREN) 1 % GEL; Apply 2 g topically 4 (four) times daily.  Dispense: 100 g; Refill: 4  Advised patient to call us back if he changes his mind about having home health or going to an assisted living facility.  Follow Up Instructions:  end of January with new PCP   I discussed the assessment and treatment plan with the patient. The patient was provided an opportunity to ask questions and all were answered. The patient agreed with the plan and demonstrated an understanding of the instructions.   The patient was advised to call back or seek an in-person evaluation if the symptoms worsen or if the condition fails to improve as anticipated.  I provided 10 minutes of non-face-to-face time during this  encounter.   Karle Plumber, MD

## 2019-02-19 ENCOUNTER — Ambulatory Visit (INDEPENDENT_AMBULATORY_CARE_PROVIDER_SITE_OTHER): Payer: Medicare Other

## 2019-02-19 ENCOUNTER — Other Ambulatory Visit: Payer: Self-pay

## 2019-02-19 DIAGNOSIS — Z23 Encounter for immunization: Secondary | ICD-10-CM | POA: Diagnosis not present

## 2019-02-19 NOTE — Progress Notes (Signed)
Patient here for flu shot

## 2019-03-01 ENCOUNTER — Other Ambulatory Visit: Payer: Self-pay | Admitting: Nurse Practitioner

## 2019-03-01 DIAGNOSIS — E1100 Type 2 diabetes mellitus with hyperosmolarity without nonketotic hyperglycemic-hyperosmolar coma (NKHHC): Secondary | ICD-10-CM

## 2019-03-02 ENCOUNTER — Other Ambulatory Visit: Payer: Self-pay | Admitting: Family Medicine

## 2019-03-09 IMAGING — CR DG SHOULDER 2+V*R*
3 series · 3 of 3 positions shown · non-contrast
Comparison: None

CLINICAL DATA: Rolled off the bed 1 month ago, still having
shoulder pain and pain down entire RIGHT humerus

EXAM:
RIGHT SHOULDER - 2+ VIEW

[shoulder grashey]
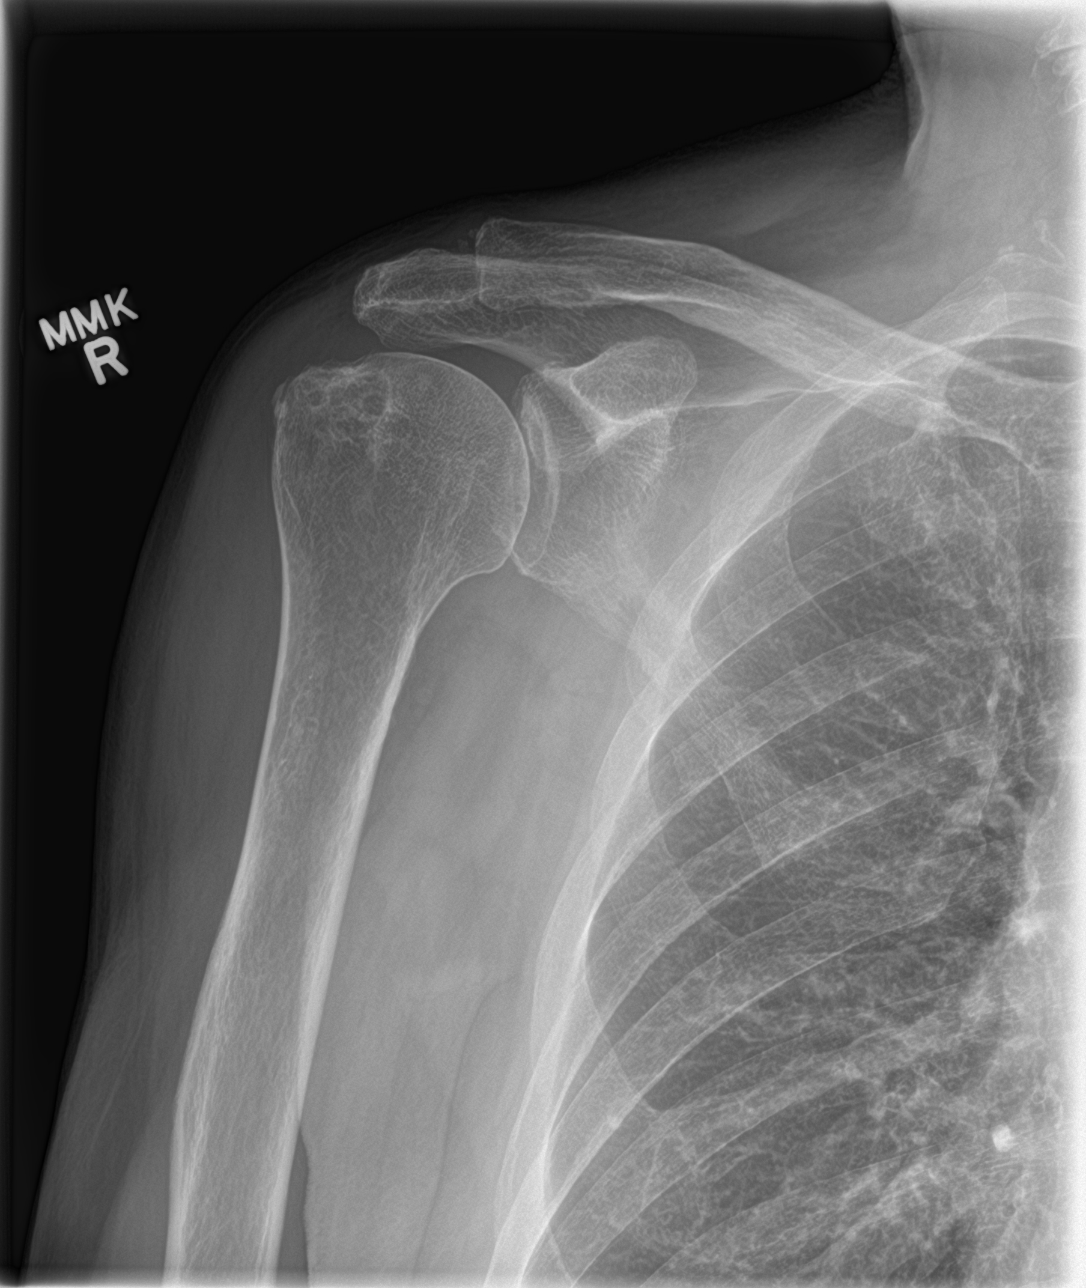

[shoulder y view]
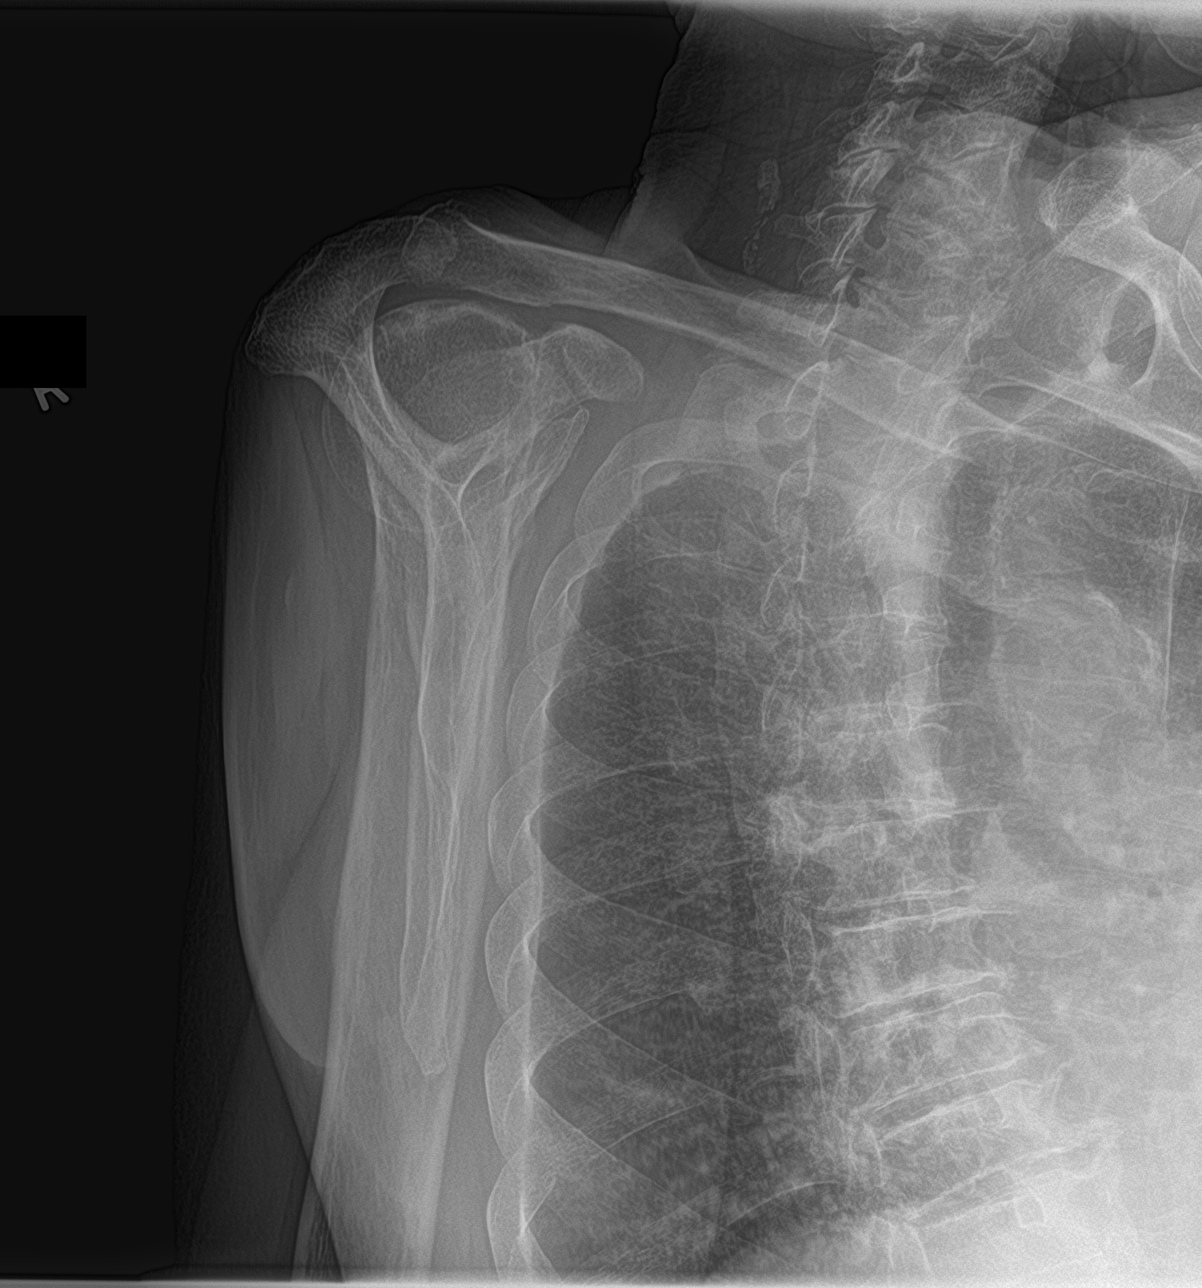

[shoulder ap neutral]
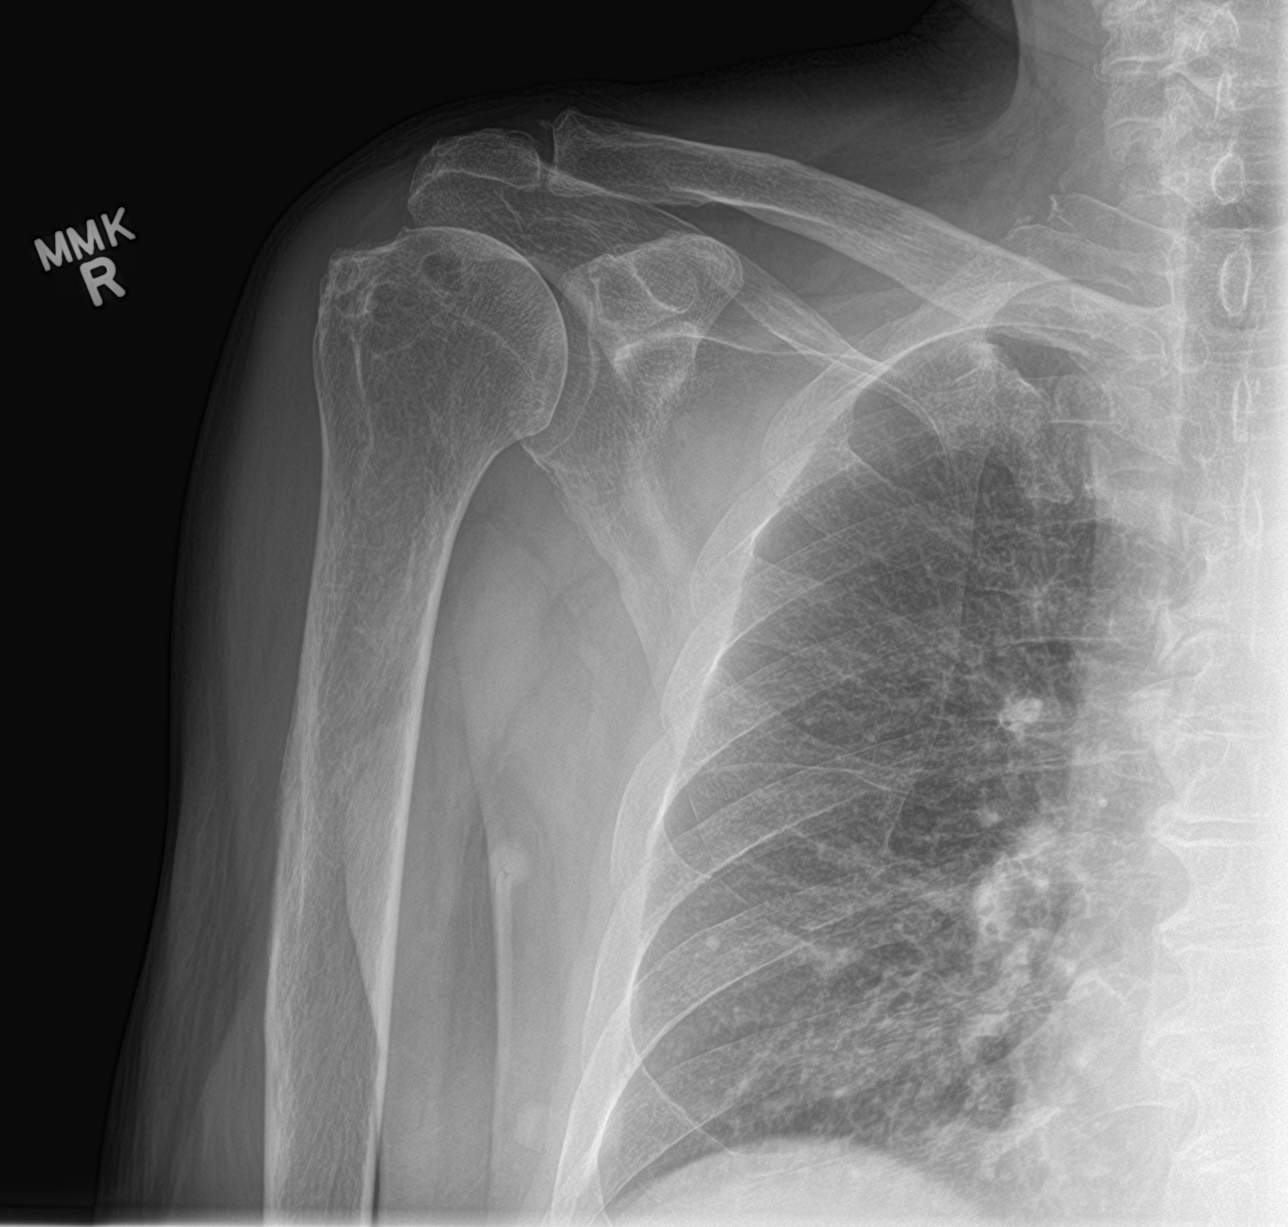

[3 of 3 positions shown; findings below may reference images not displayed]

FINDINGS: Osseous demineralization.

AC joint alignment normal.

Visualized RIGHT ribs intact.

No acute fracture, dislocation, or bone destruction.
IMPRESSION: No acute abnormalities.

## 2019-03-09 IMAGING — CR DG ELBOW COMPLETE 3+V*R*
5 series · 5 of 5 positions shown · non-contrast
Comparison: None.

CLINICAL DATA: Right shoulder 2 right elbow pain post fall.

EXAM:
RIGHT ELBOW - COMPLETE 3+ VIEW

[elbow ap]
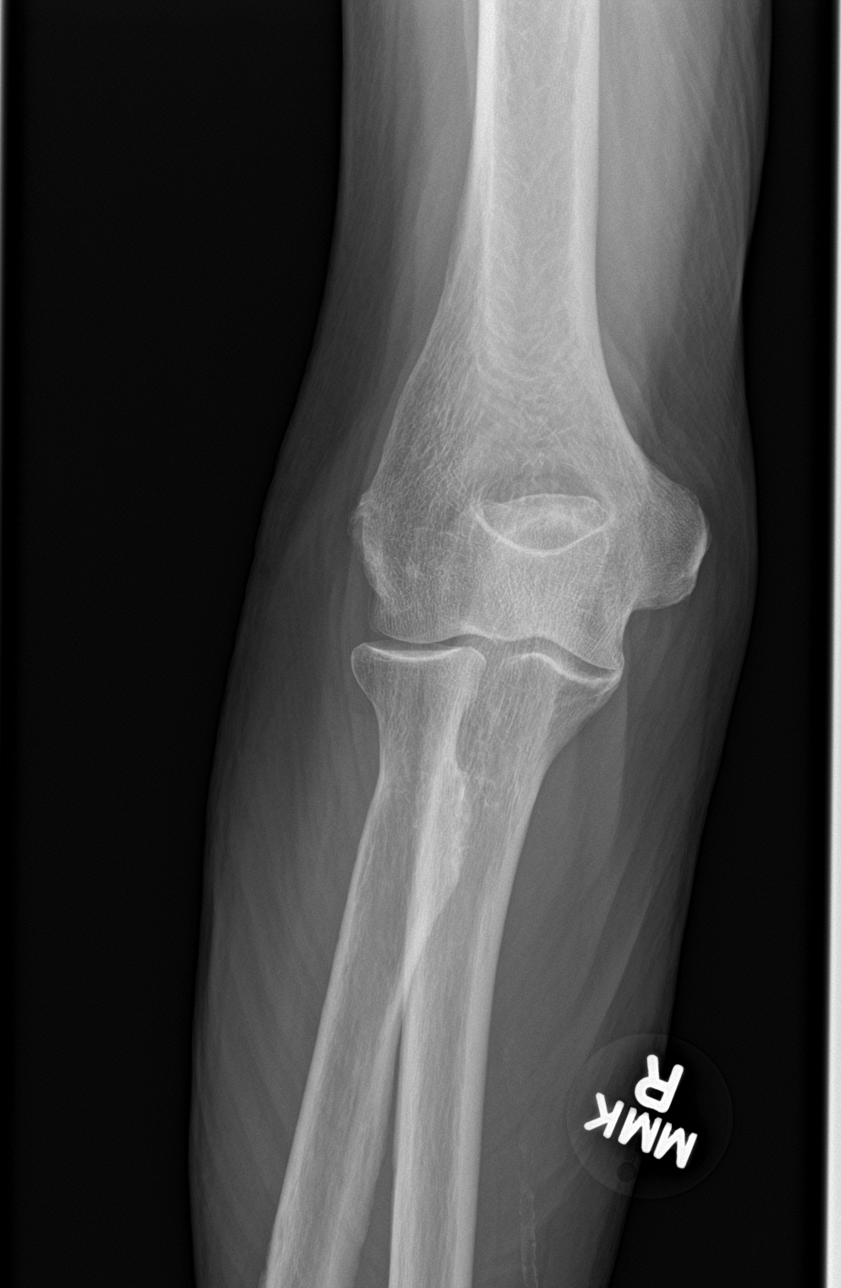

[elbow obl (1 of 3)]
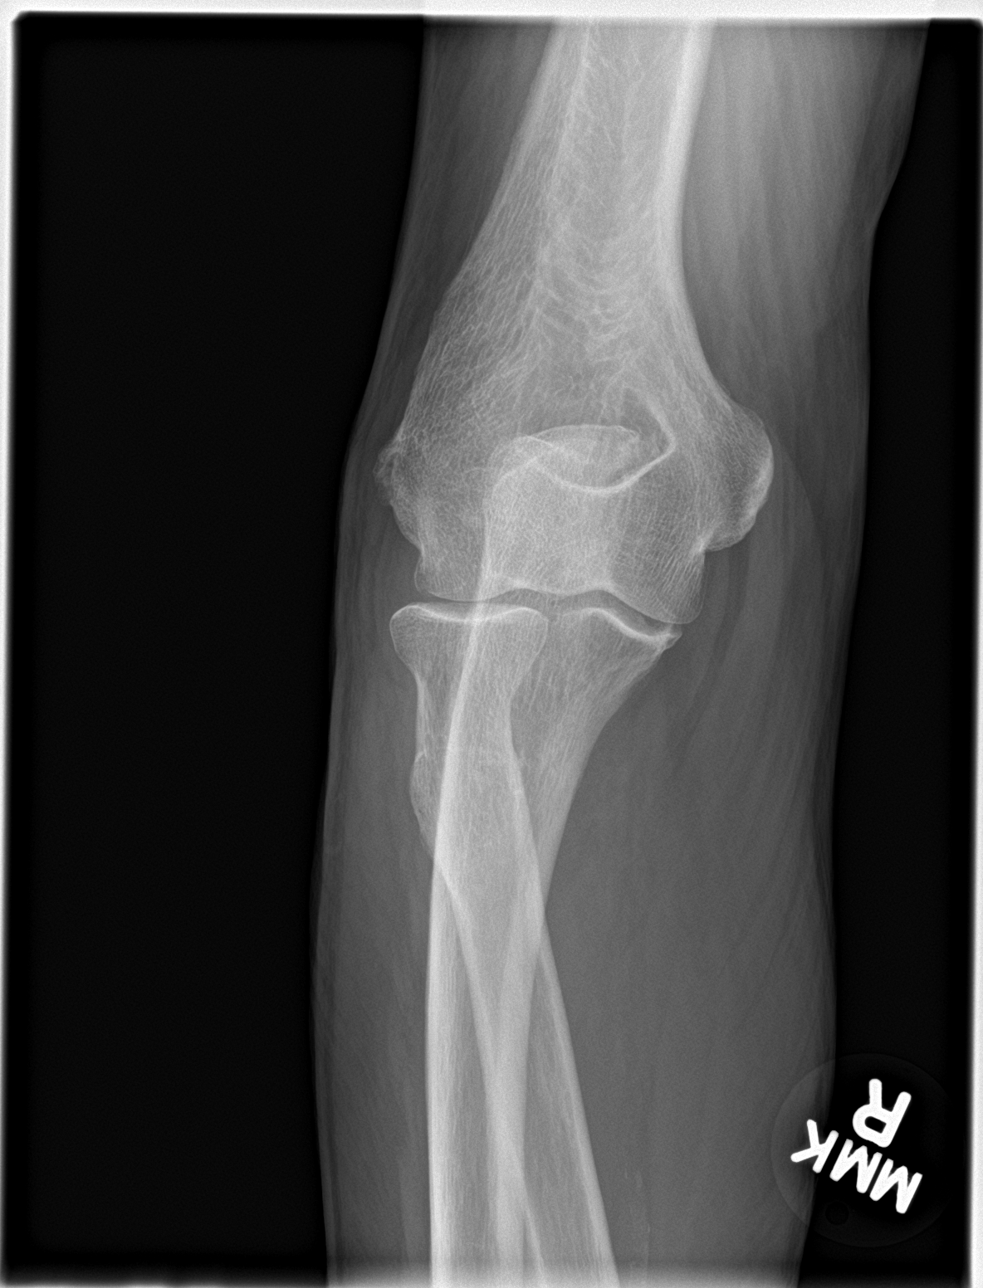

[elbow obl (2 of 3)]
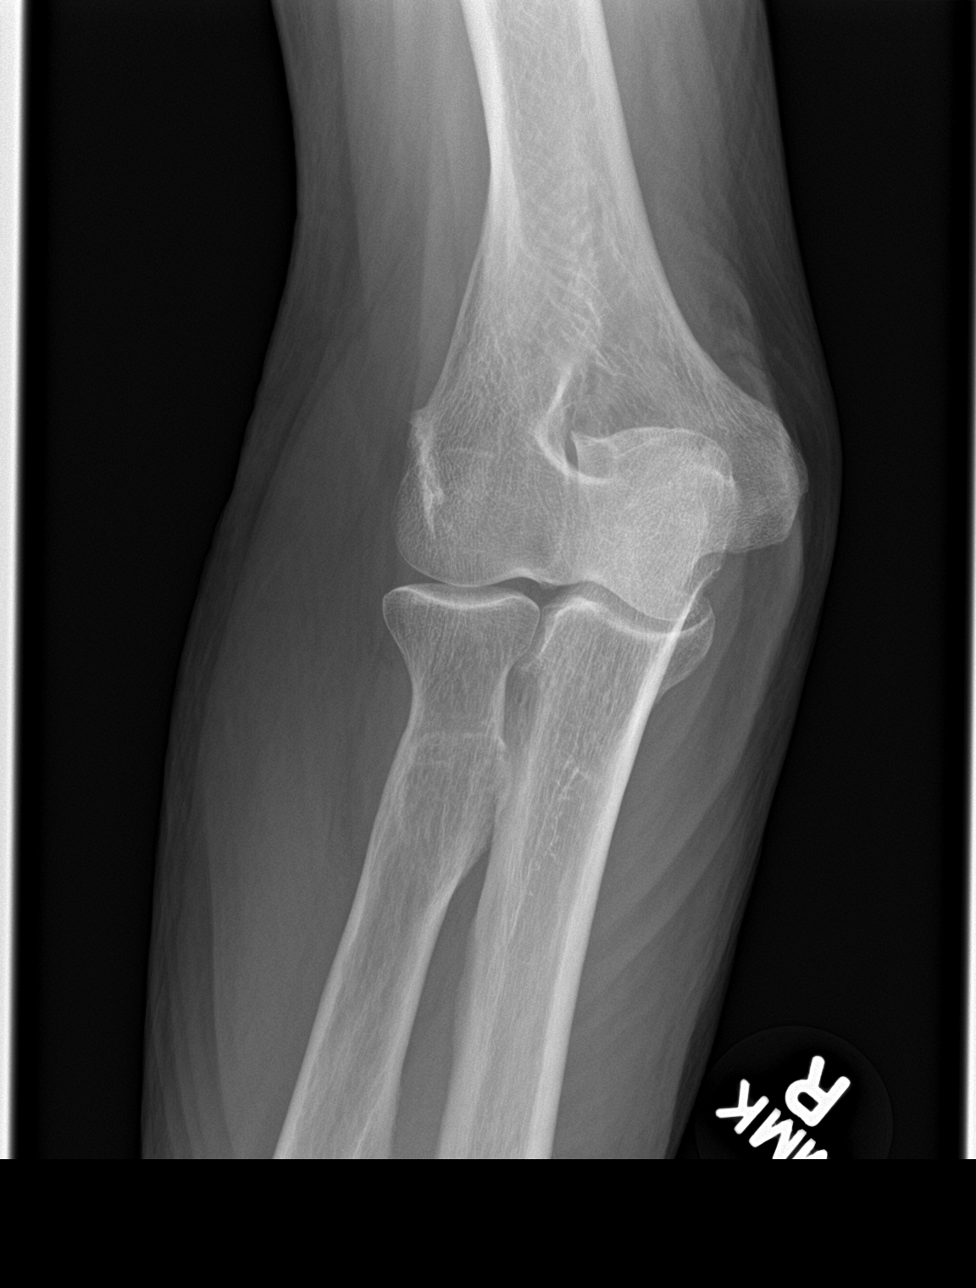

[elbow lat]
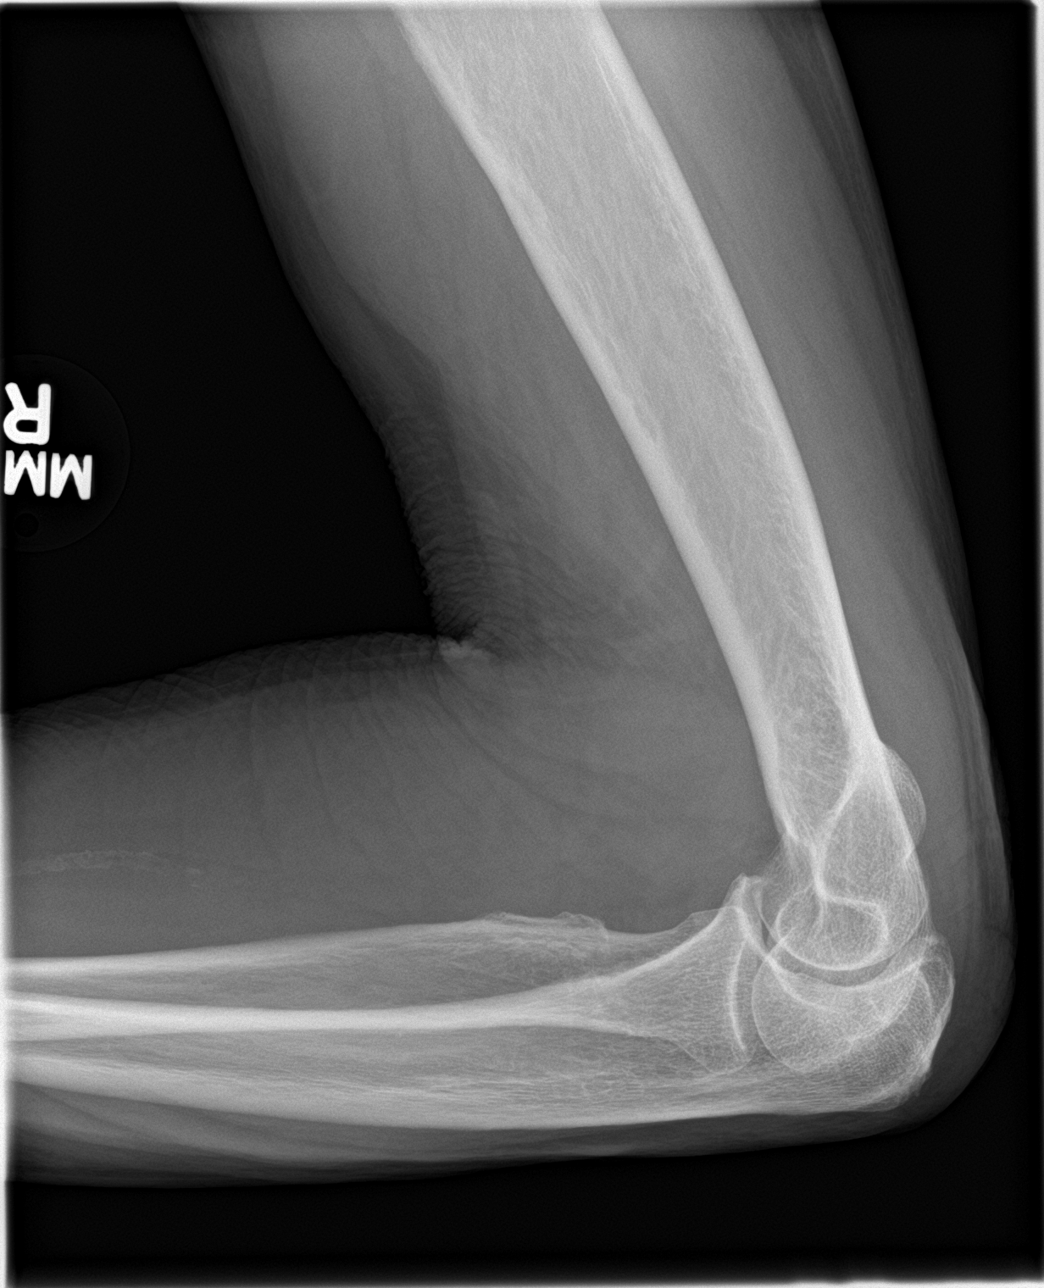

[elbow obl (3 of 3)]
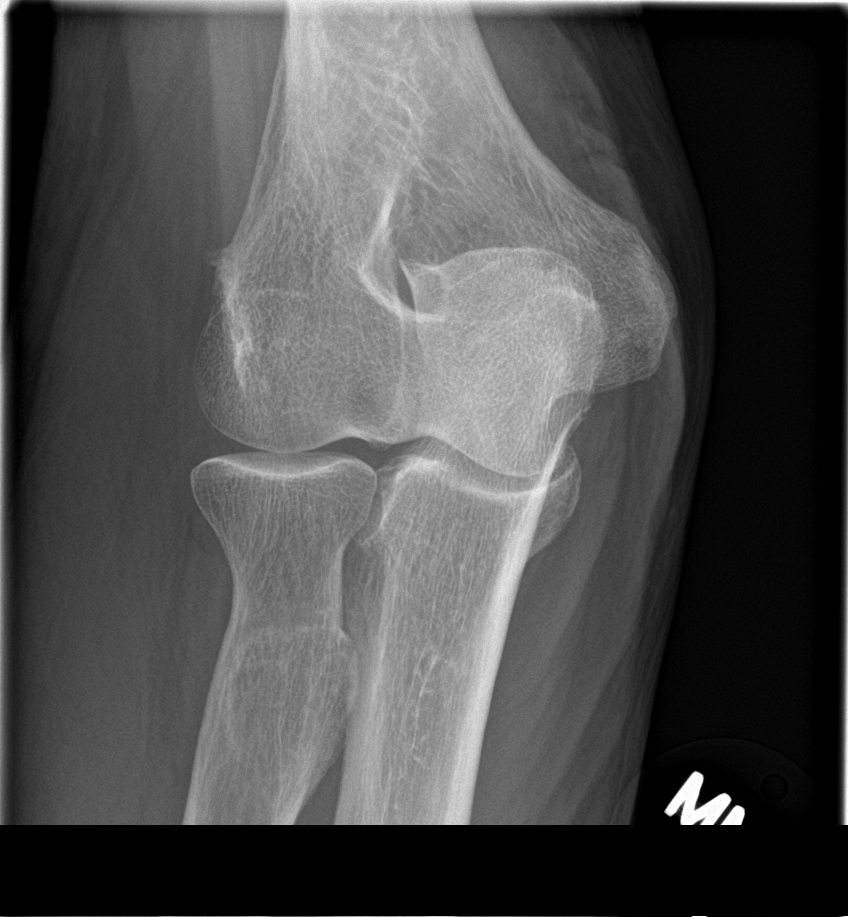

[5 of 5 positions shown; findings below may reference images not displayed]

FINDINGS: There is no evidence of fracture, dislocation, or joint effusion.
There is no evidence of arthropathy or other focal bone abnormality.
Soft tissues are unremarkable.
IMPRESSION: Negative.

## 2019-03-22 IMAGING — CR DG CHEST 2V
2 series · 2 of 2 positions shown · non-contrast
Comparison: Radiographs November 12, 2017.

CLINICAL DATA: Weakness, nausea.

EXAM:
CHEST - 2 VIEW

[w chest pa]
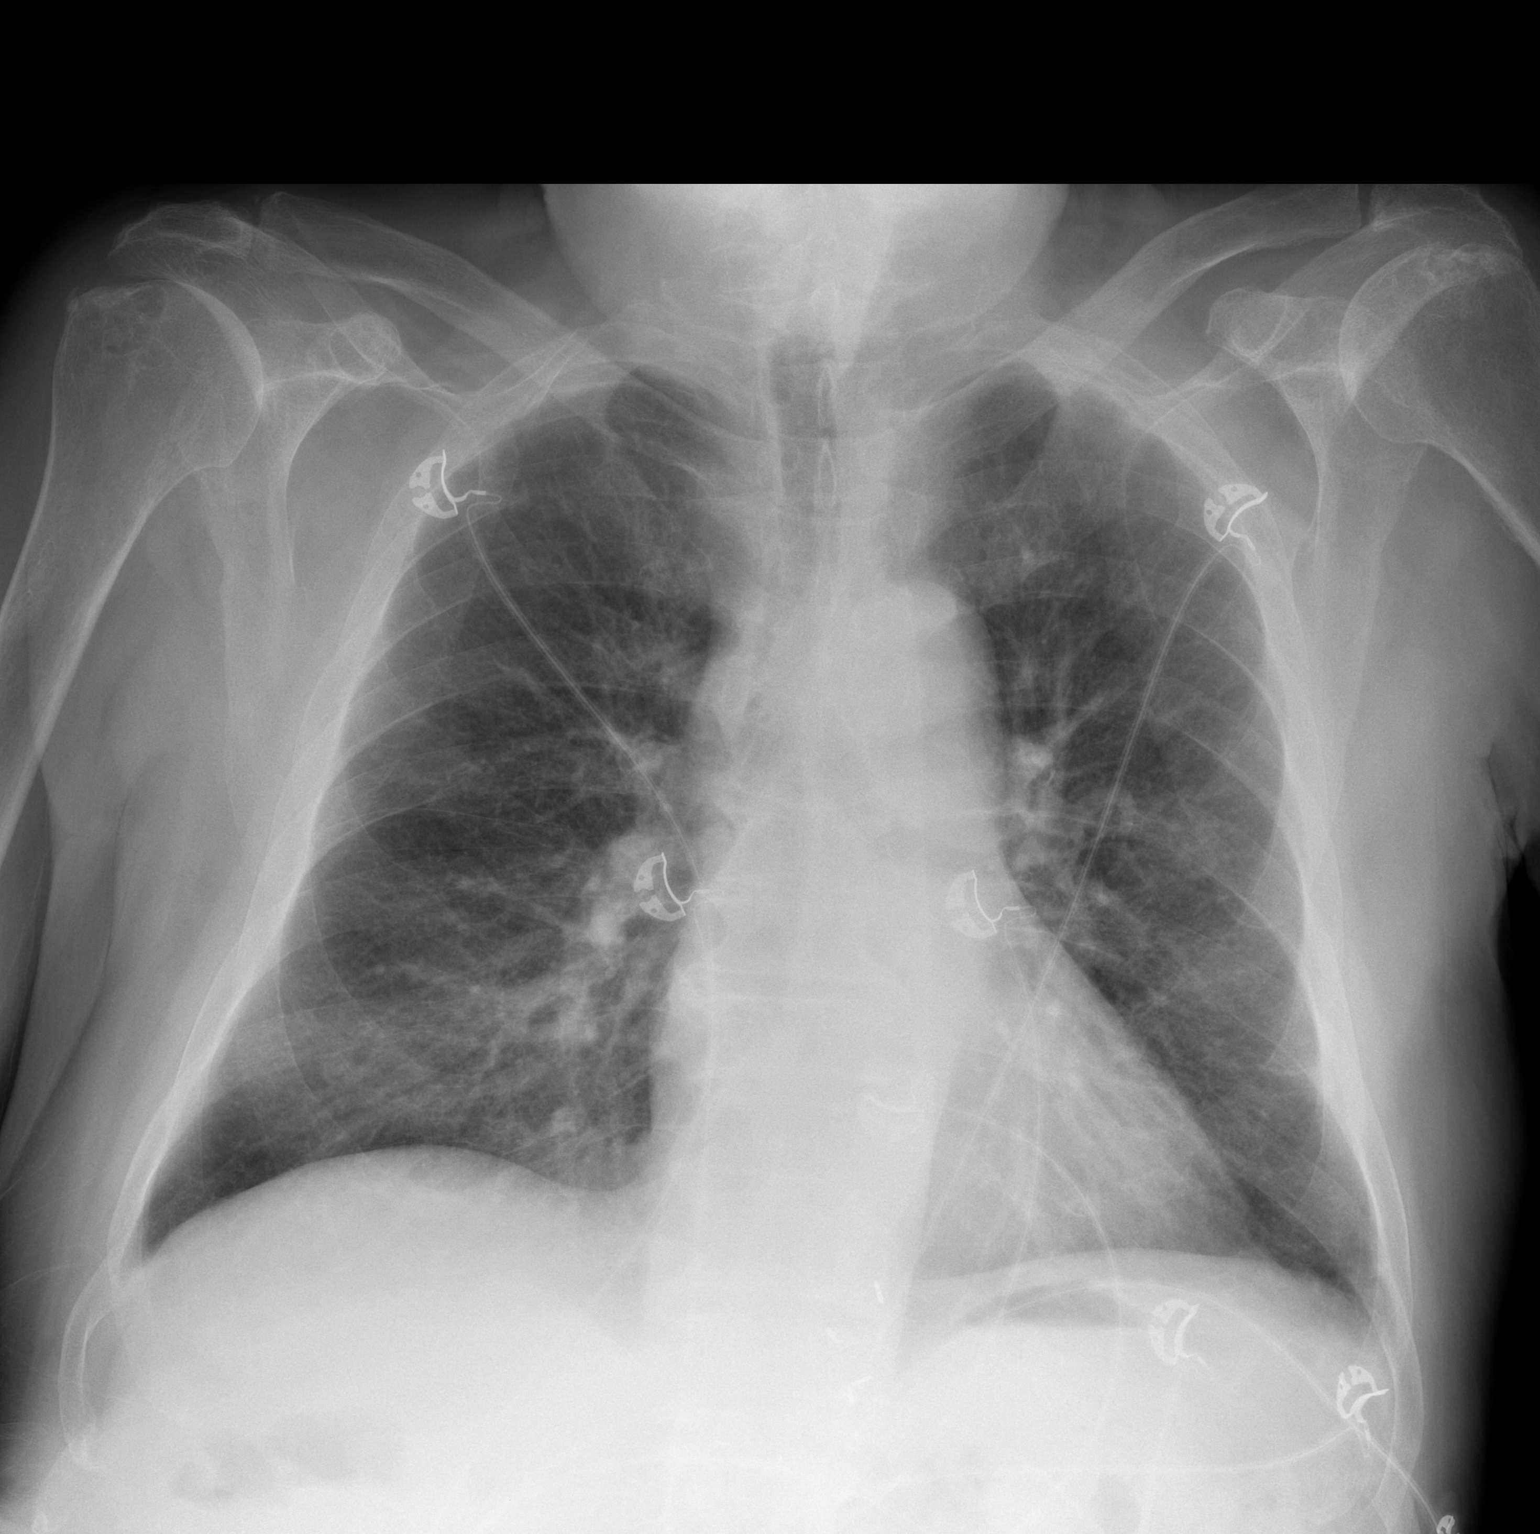

[w chest lat]
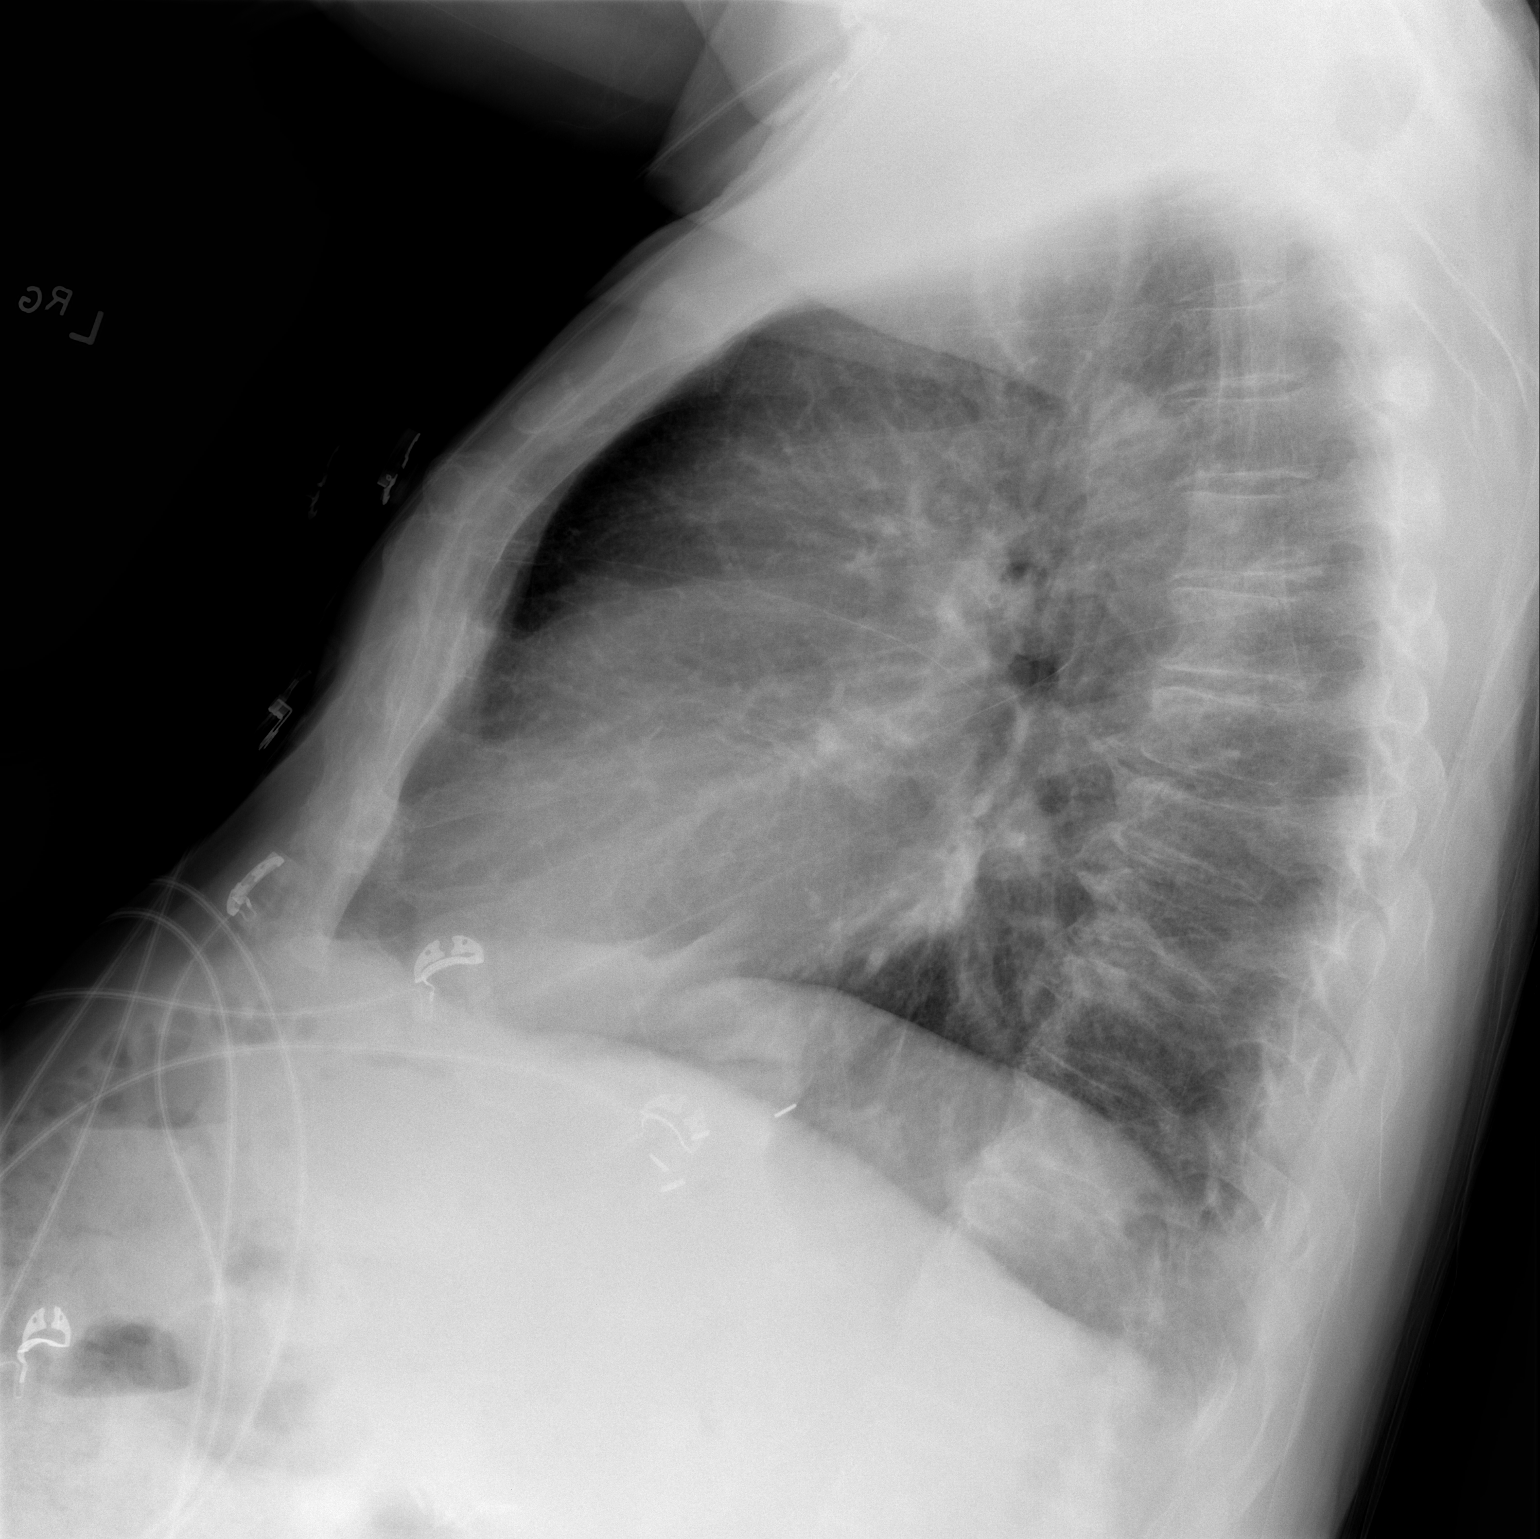

[2 of 2 positions shown; findings below may reference images not displayed]

FINDINGS: The heart size and mediastinal contours are within normal limits.
Both lungs are clear. The visualized skeletal structures are
unremarkable.
IMPRESSION: No active cardiopulmonary disease.

## 2019-03-24 IMAGING — DX DG CHEST 2V
2 series · 2 of 2 positions shown · non-contrast
Comparison: Chest x-ray dated 01/09/2018. Chest x-ray dated
05/21/2017.

CLINICAL DATA: LEFT-sided rib pain and weakness for a month, rapid
heart rate, shortness of breath, cough and dizziness beginning last
night.

EXAM:
CHEST - 2 VIEW

[chest lat]
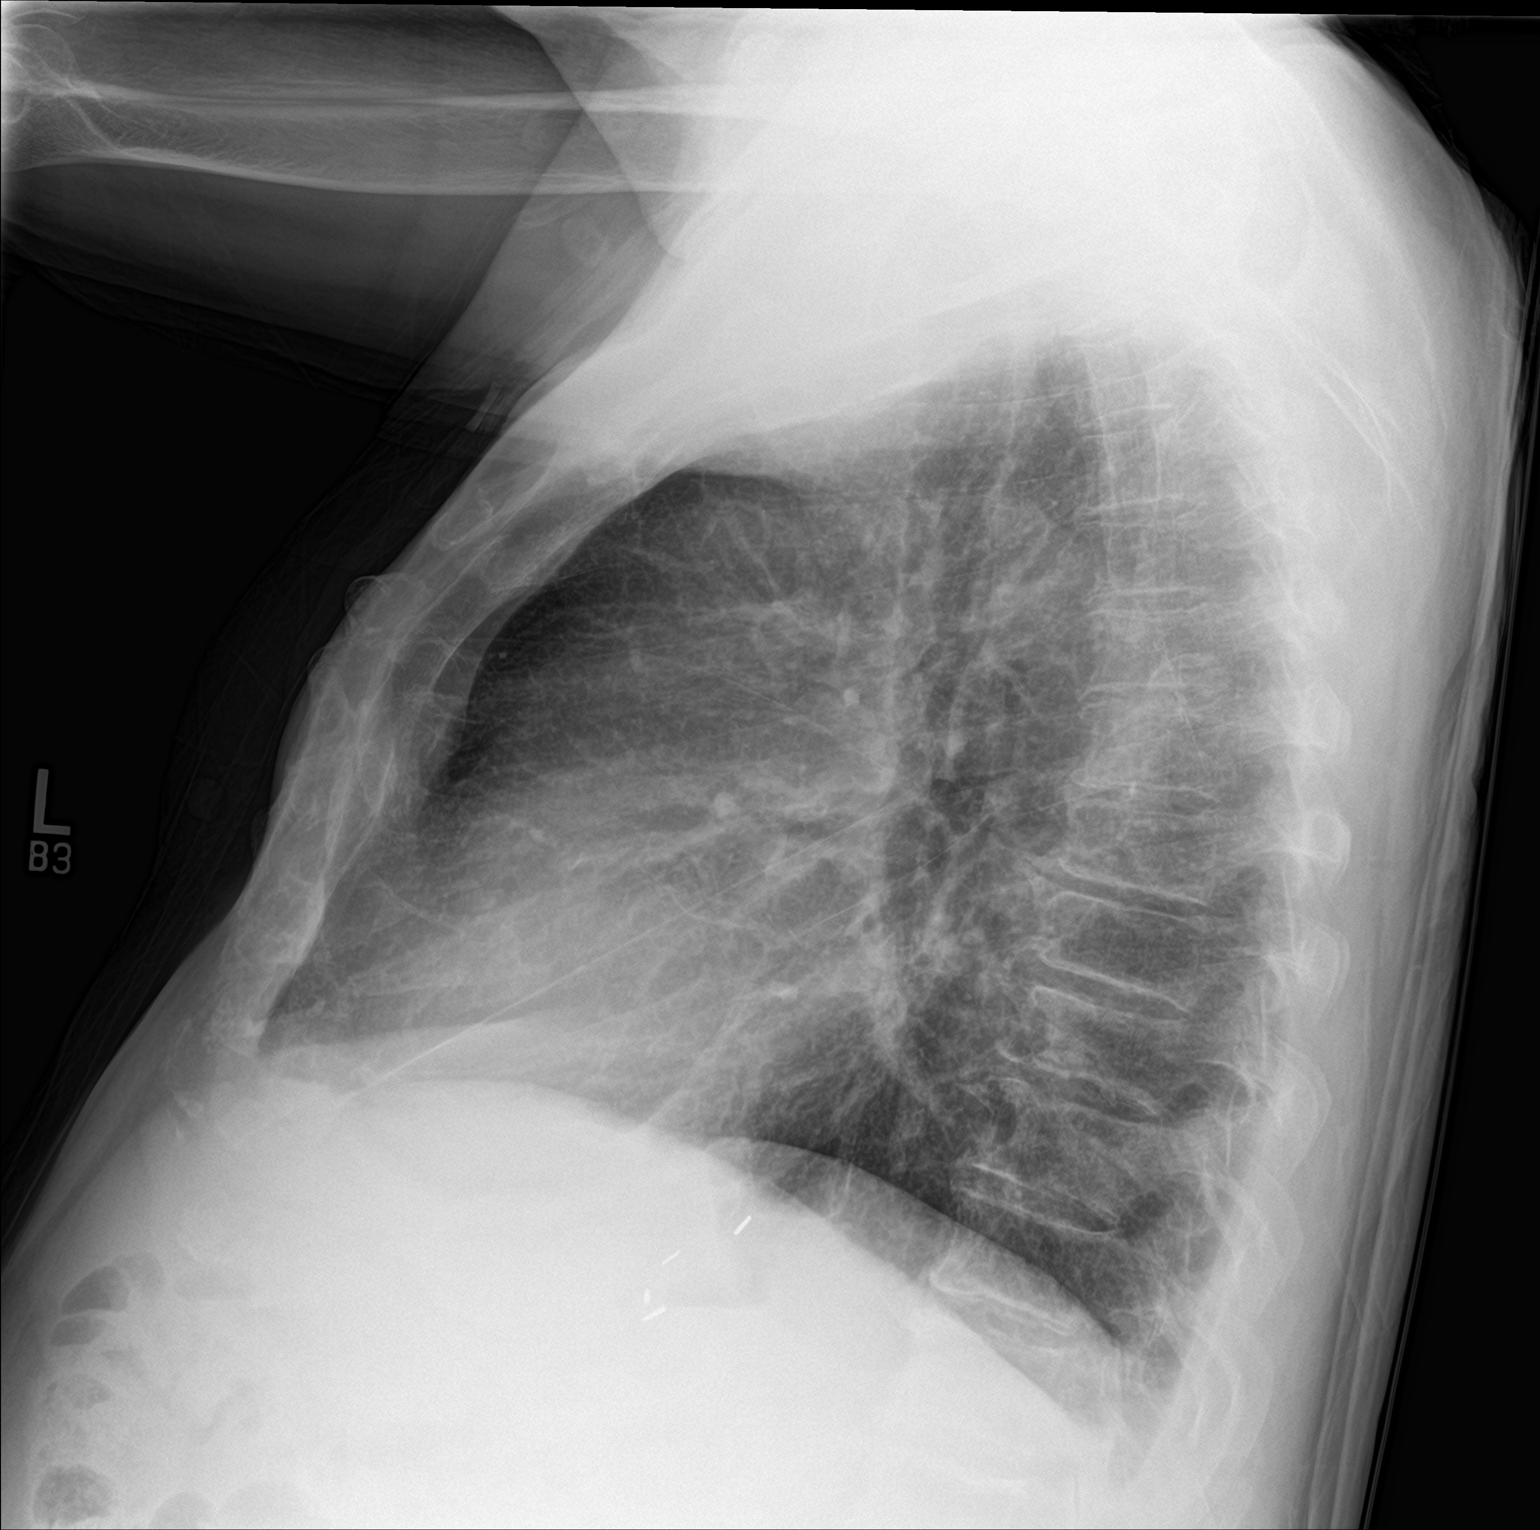

[chest ap]
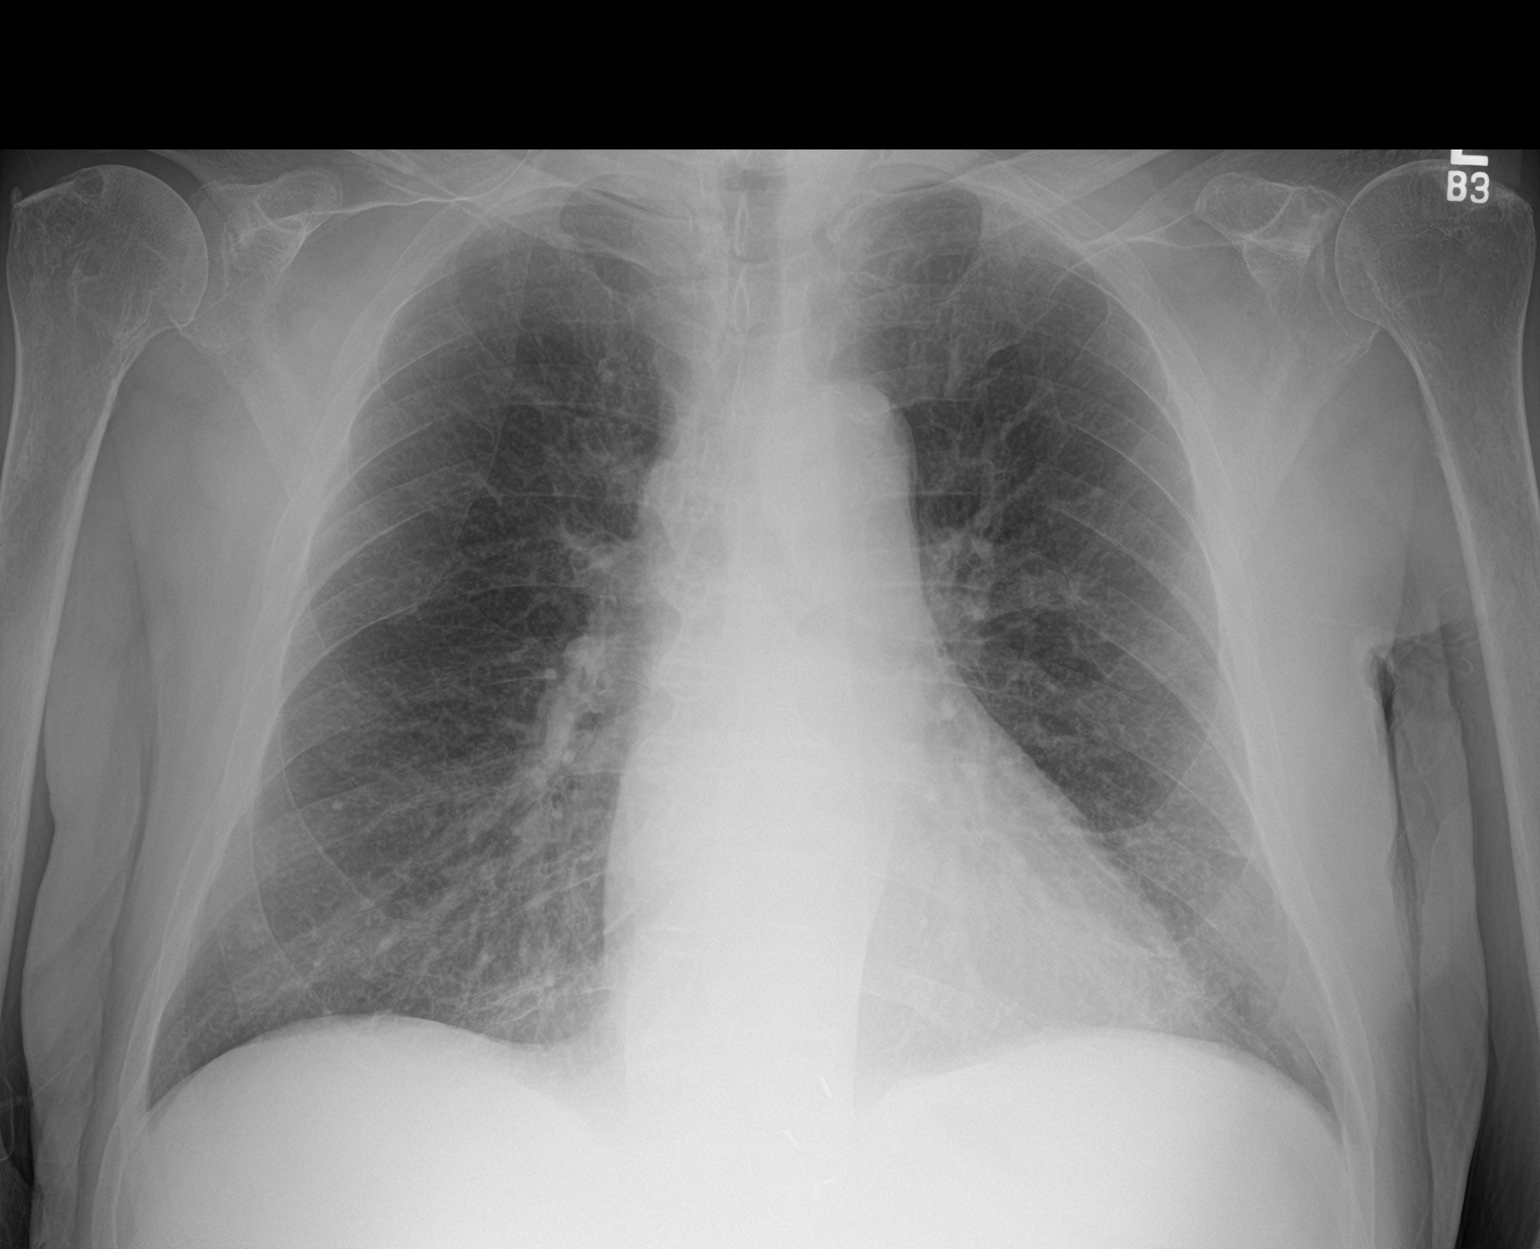

[2 of 2 positions shown; findings below may reference images not displayed]

FINDINGS: Heart size is upper normal, stable. Overall cardiomediastinal
silhouette is stable. Chronic bronchitic changes noted centrally. No
confluent opacity to suggest a developing pneumonia. No pleural
effusion or pneumothorax seen.

Chronic vertebral body wedging within the thoracic spine, stable. No
acute appearing osseous abnormality.
IMPRESSION: 1. No acute findings.  No evidence of pneumonia or pulmonary edema.
2. Chronic bronchitic changes.

## 2019-03-27 IMAGING — DX DG CHEST 2V
2 series · 2 of 2 positions shown · non-contrast
Comparison: 01/11/2018, 11/12/2017

CLINICAL DATA: Atrial fibrillation with palpitation

EXAM:
CHEST - 2 VIEW

[w chest lat]
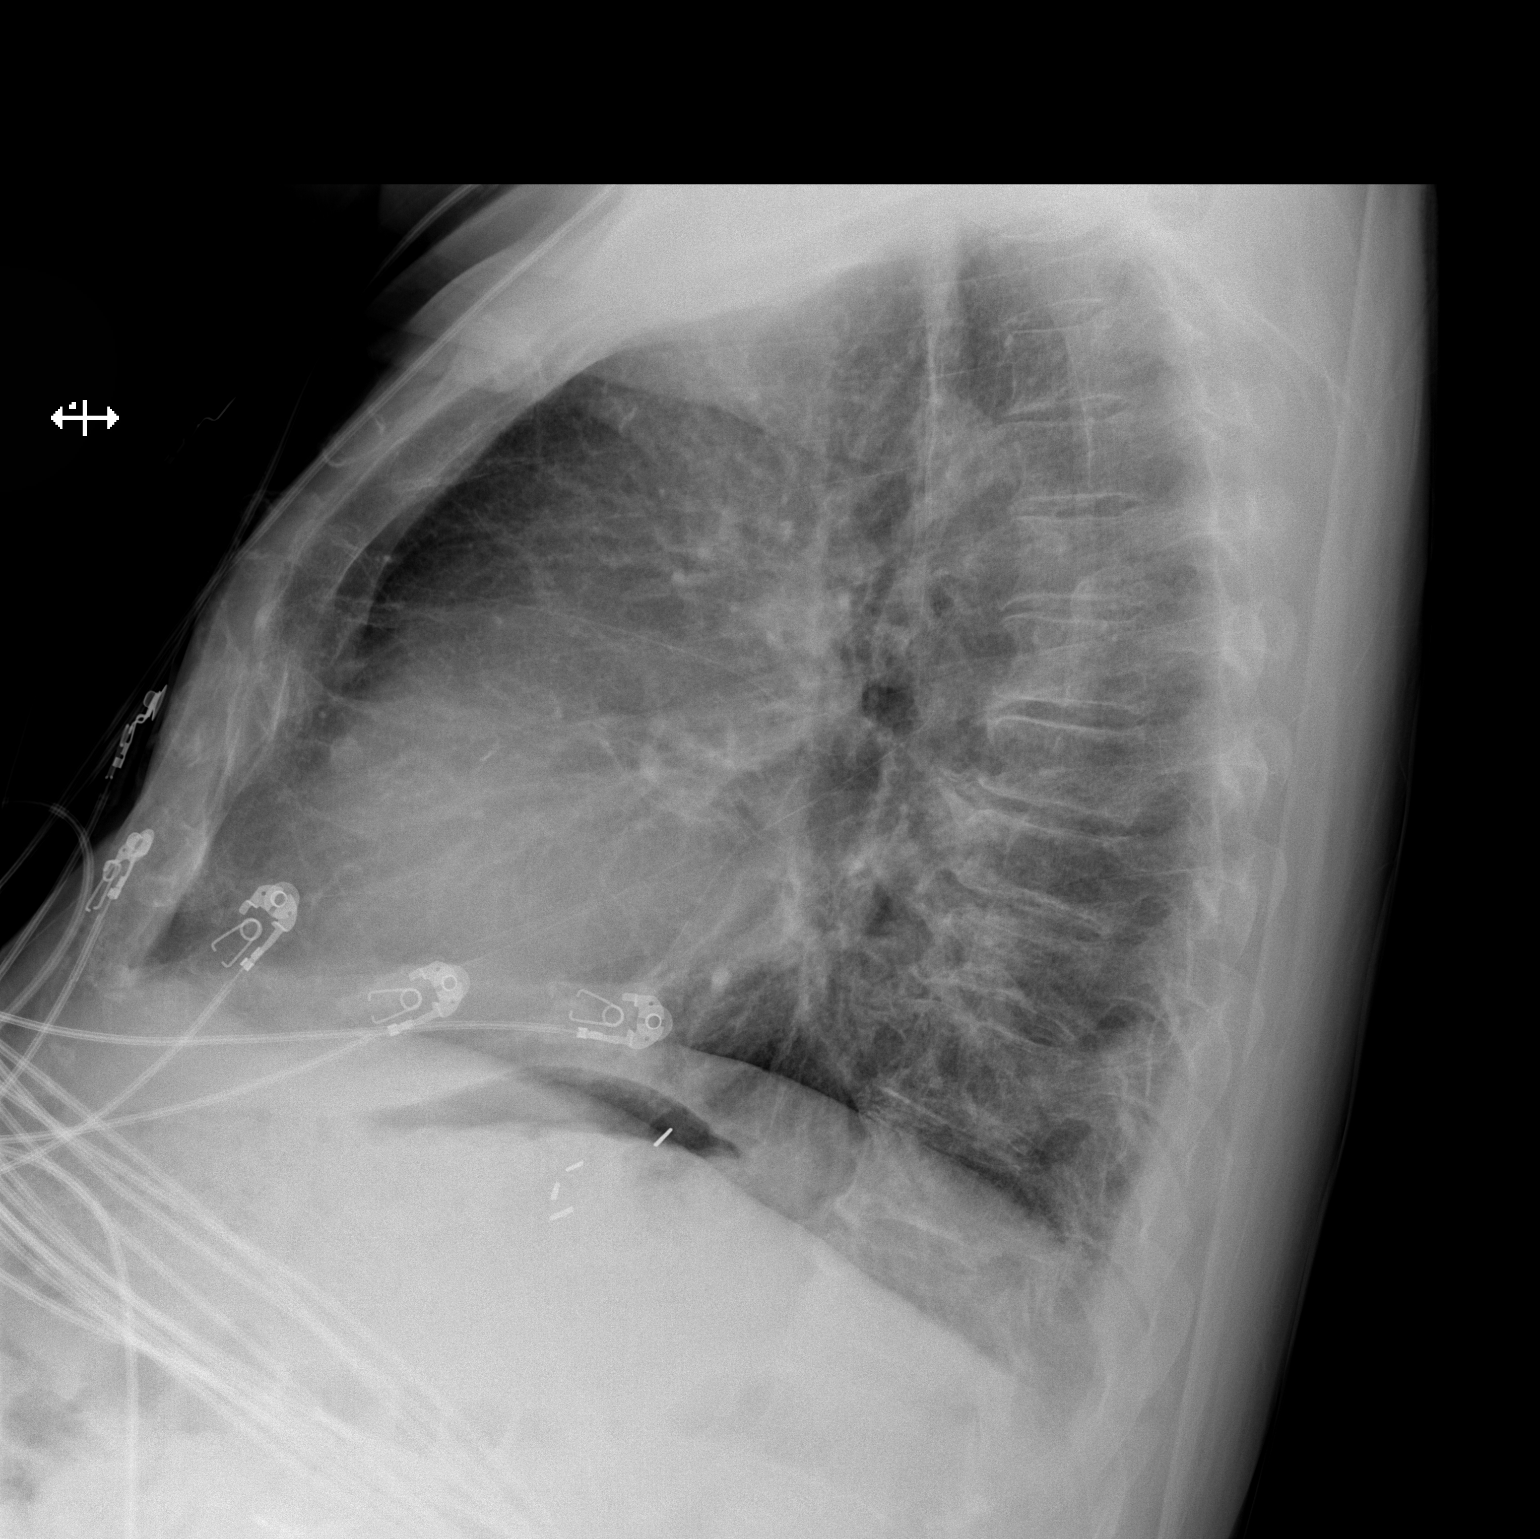

[x chest ap]
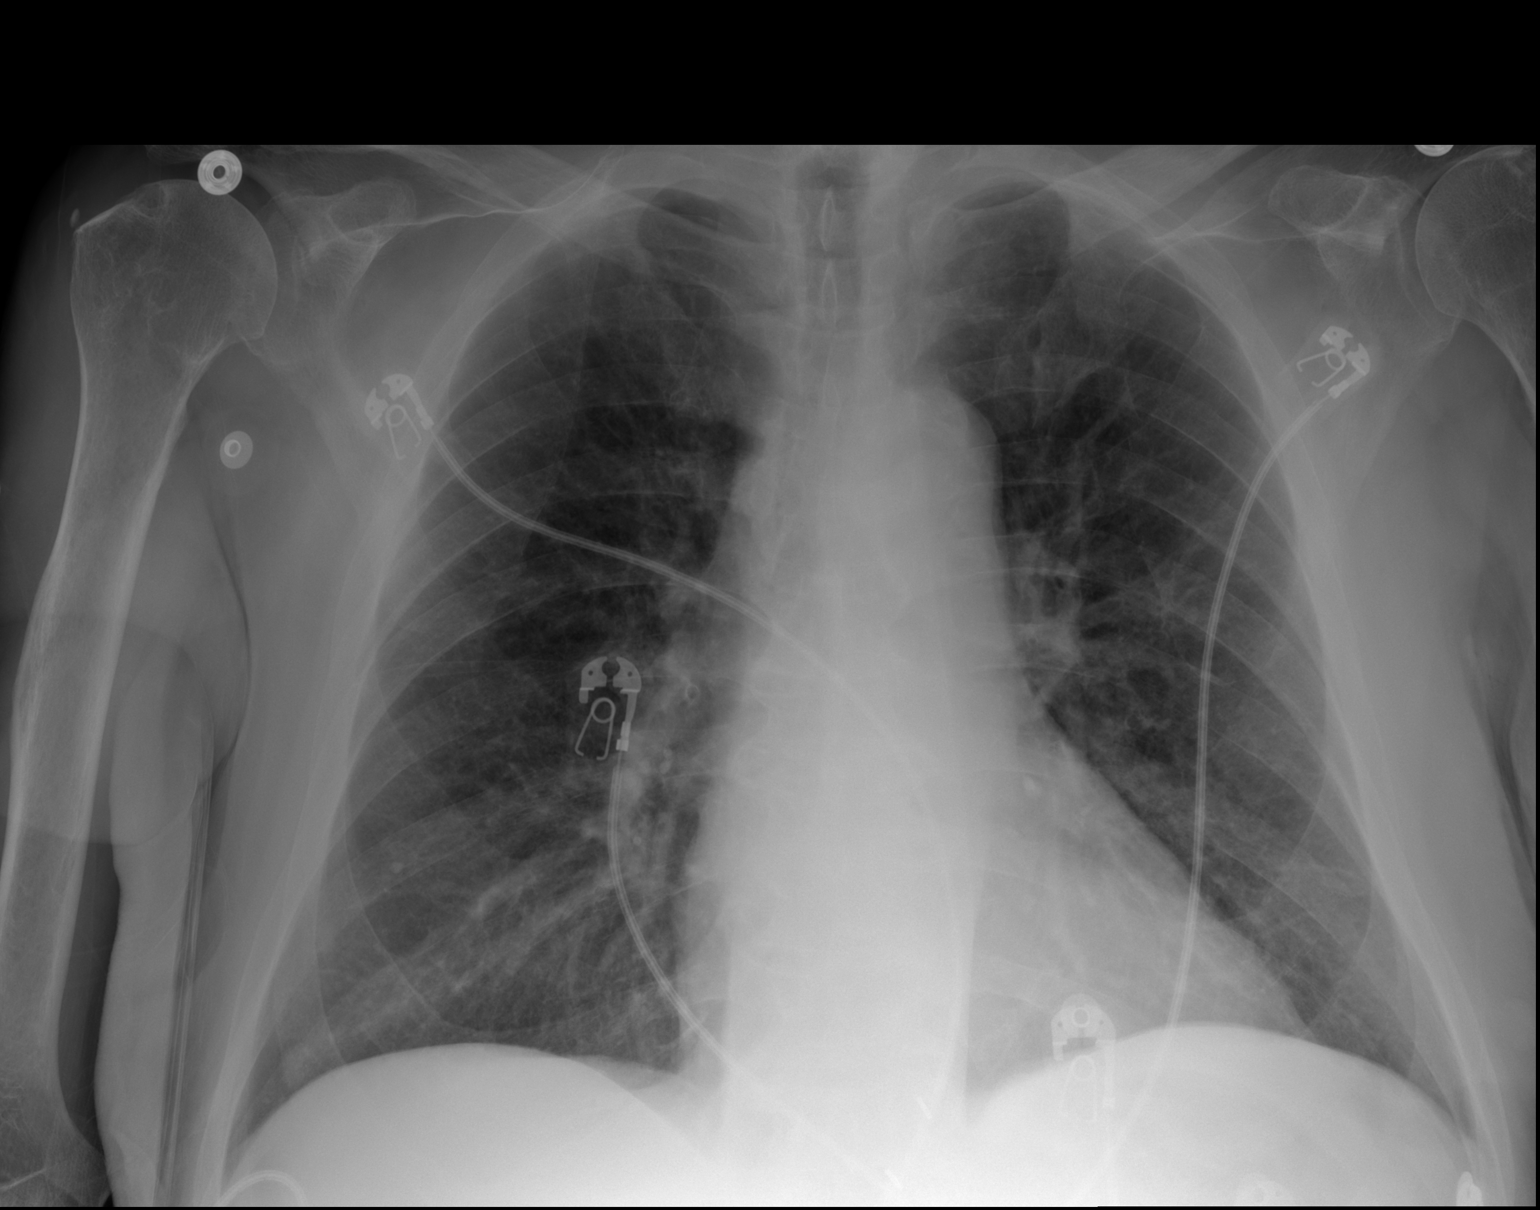

[2 of 2 positions shown; findings below may reference images not displayed]

FINDINGS: The heart size and mediastinal contours are within normal limits.
Both lungs are clear. There are degenerative osteophytes of the
spine.
IMPRESSION: No active cardiopulmonary disease.

## 2019-03-31 IMAGING — DX DG CHEST 2V
2 series · 2 of 2 positions shown · non-contrast
Comparison: 01/16/2018

CLINICAL DATA: Chest pain, atrial flutter, back pain, generalized
weakness, history of COPD, coronary artery disease, paroxysmal
atrial fibrillation, hypertension, ulcer disease, CHF, smoker

EXAM:
CHEST - 2 VIEW

[chest lat]
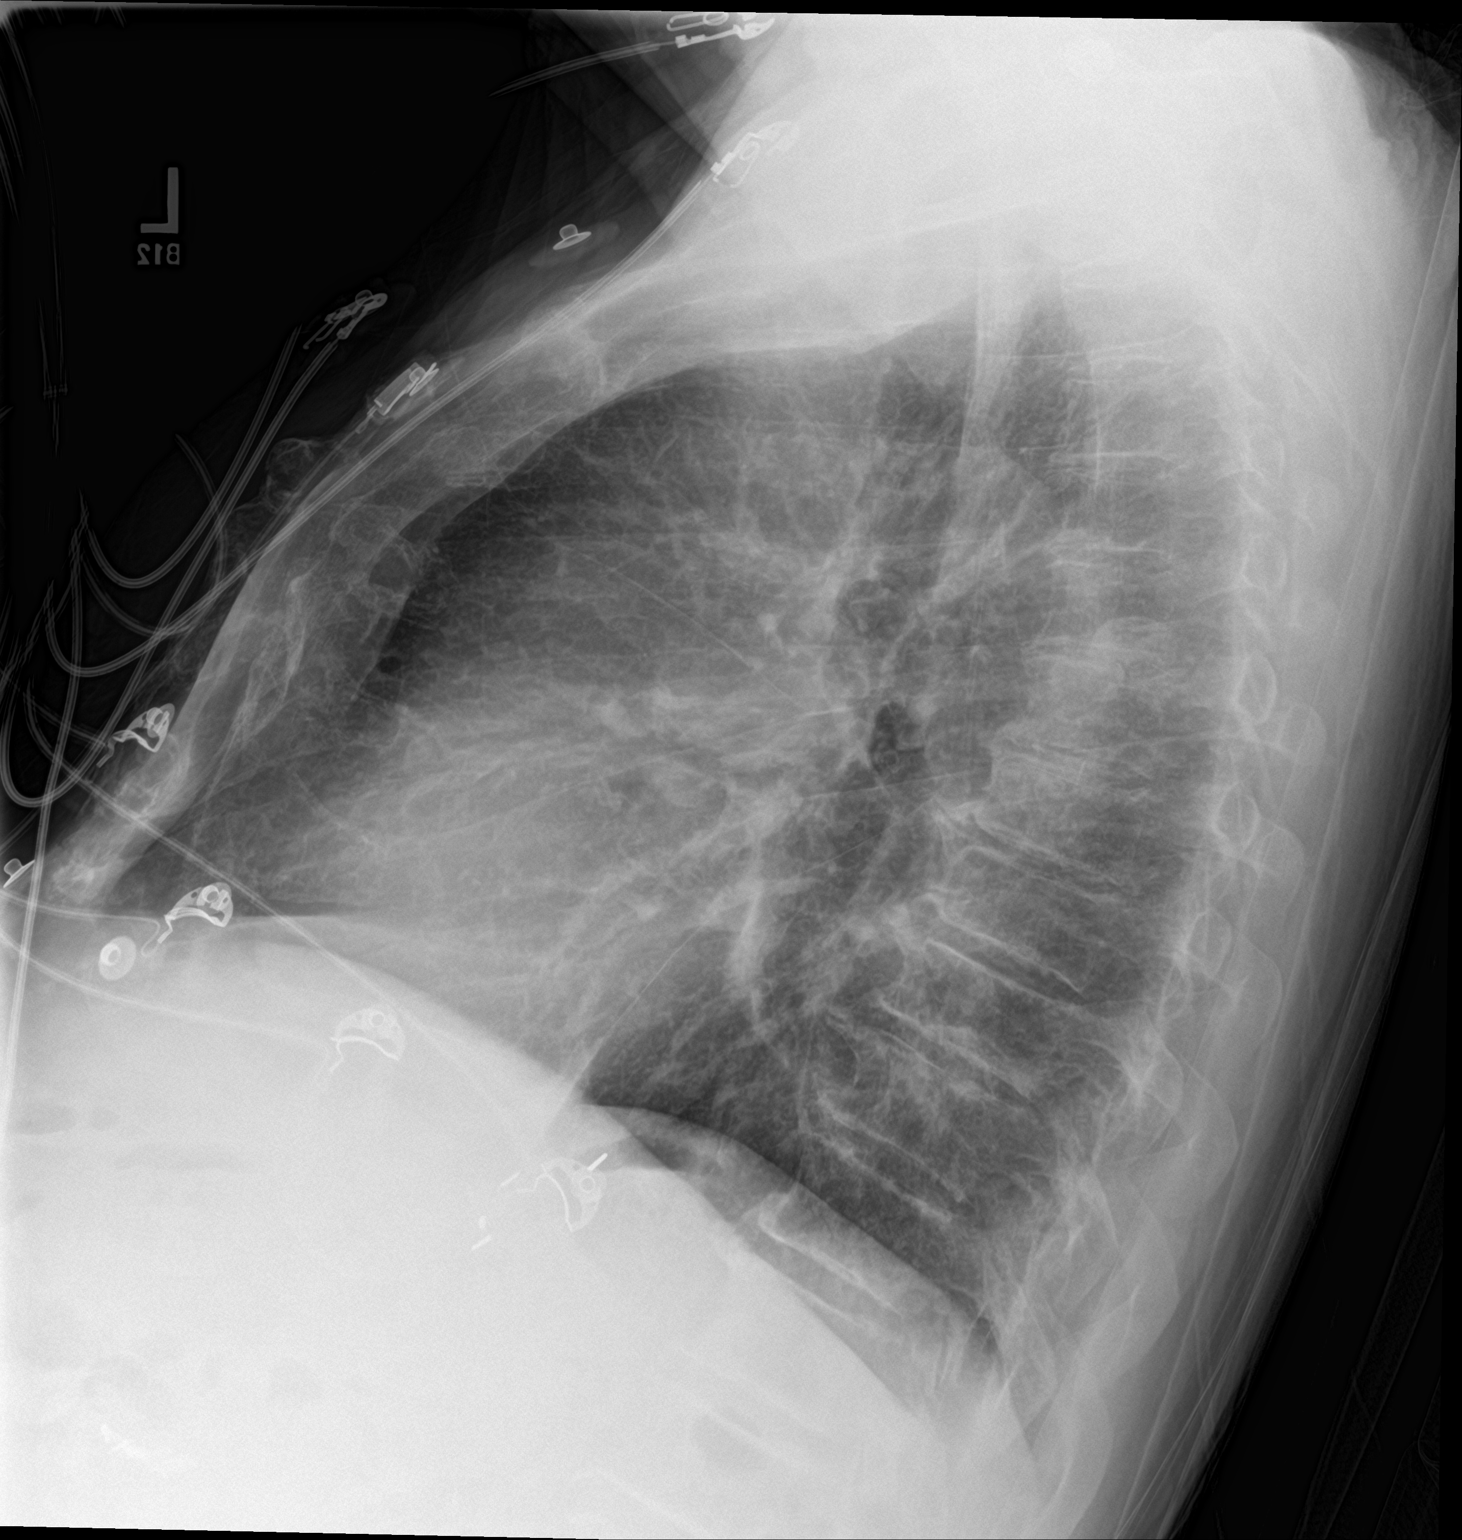

[chest ap]
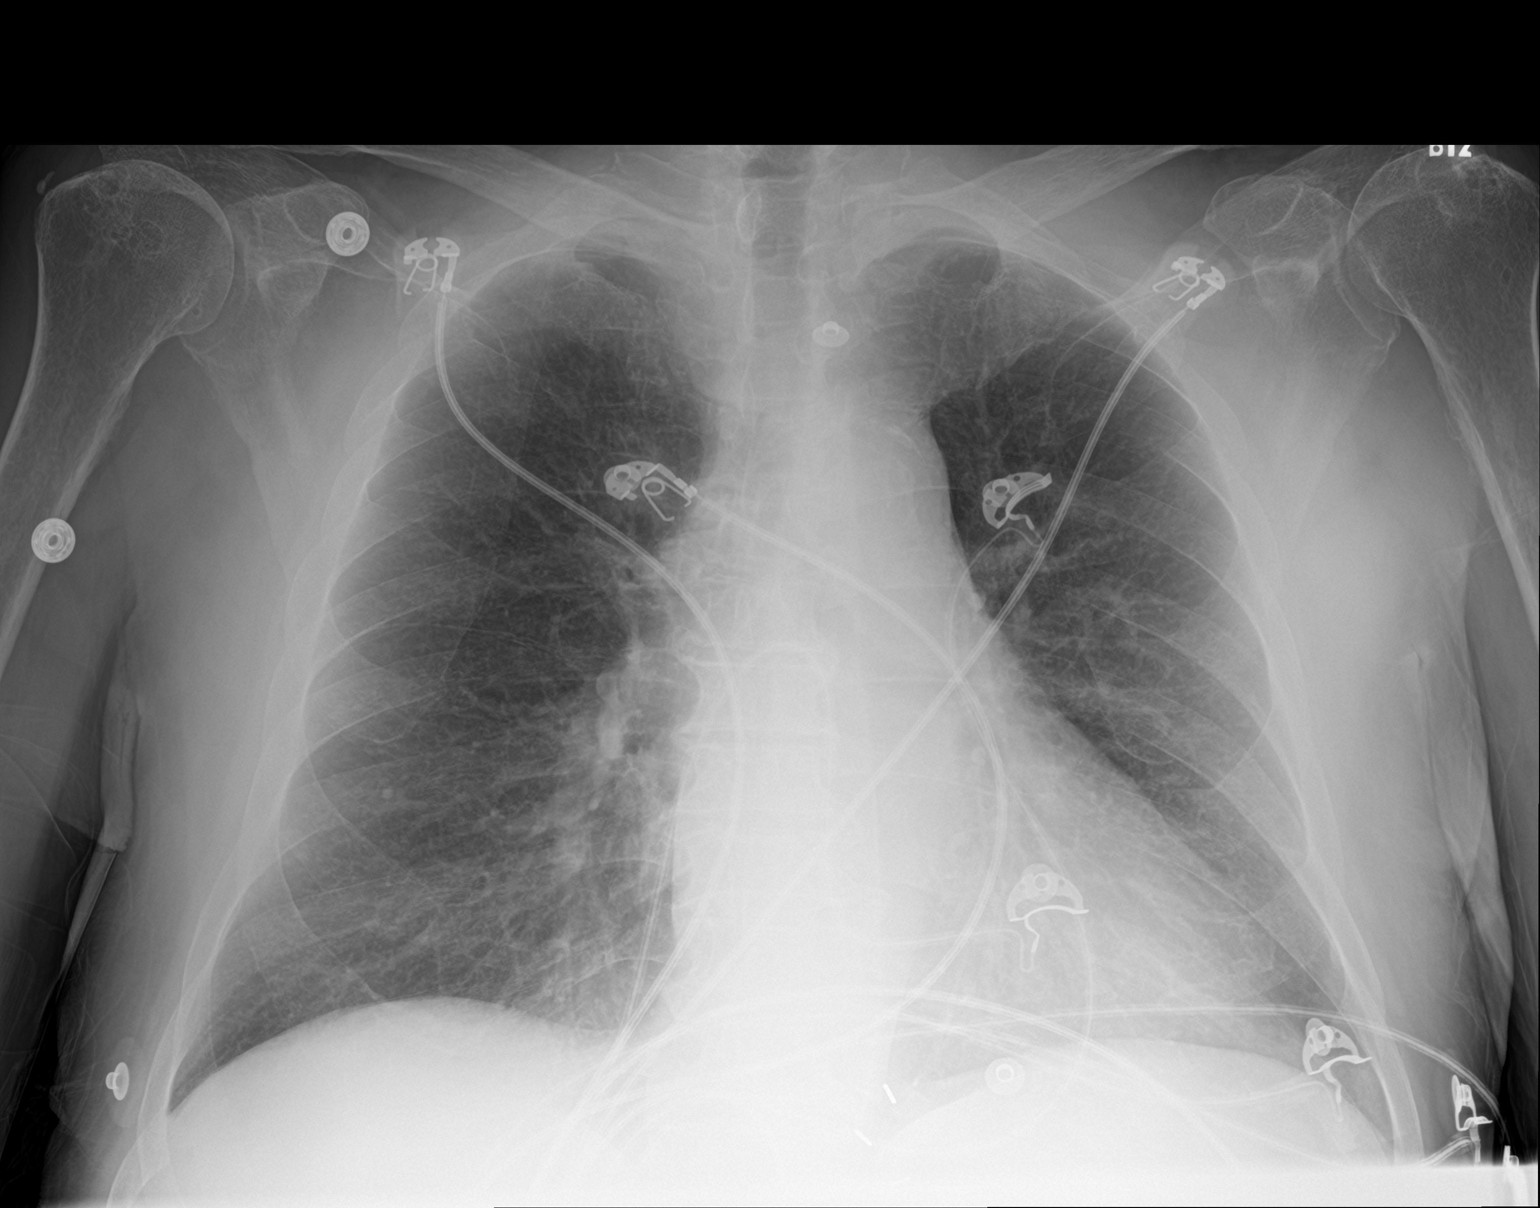

[2 of 2 positions shown; findings below may reference images not displayed]

FINDINGS: Upper normal heart size.

Atherosclerotic calcification aorta.

Mediastinal contours and pulmonary vascularity normal.

Lungs clear.

No acute infiltrate, pleural effusion or pneumothorax.

Tiny calcified granuloma at lower RIGHT chest.

Diffuse osseous demineralization.
IMPRESSION: No acute abnormalities.

## 2019-04-03 IMAGING — US US ABDOMEN LIMITED
1 series · 14 of 25 positions shown · non-contrast
Comparison: CT abdomen 10/01/2016

CLINICAL DATA: Right sided pain for 5 weeks

EXAM:
ULTRASOUND ABDOMEN LIMITED RIGHT UPPER QUADRANT

[Series 1: us abdomen limited · 0.28mm/px · 14 of 33 slices shown]
[im 1/33]
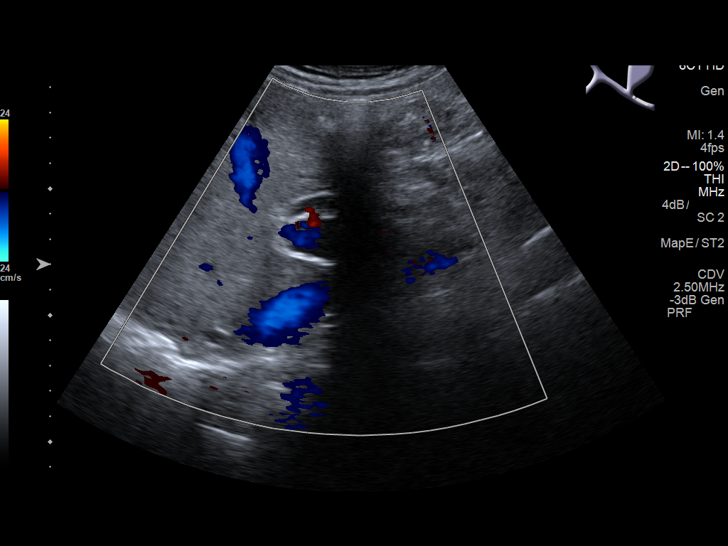
[im 3/33]
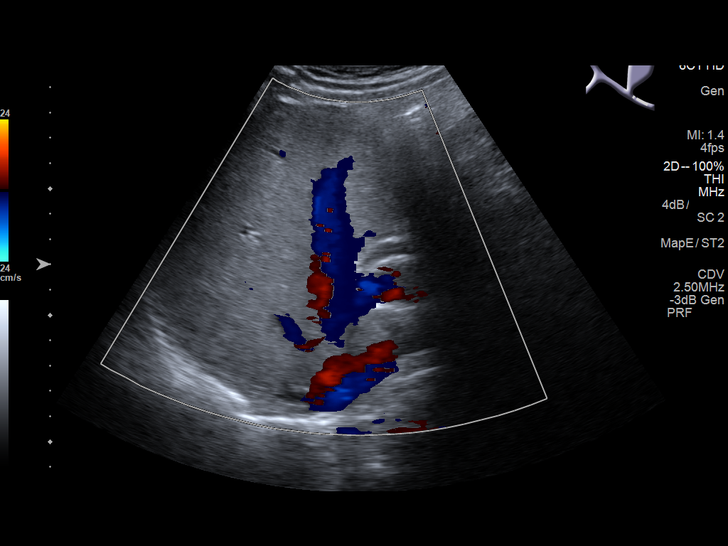
[im 6/33]
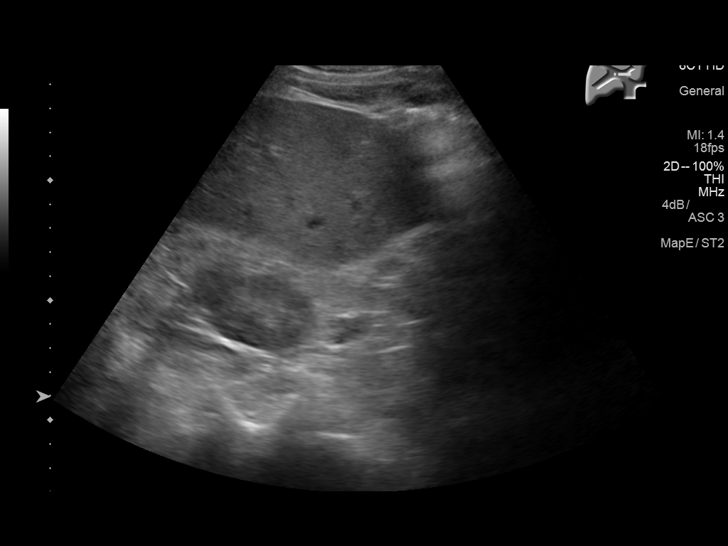
[im 9/33]
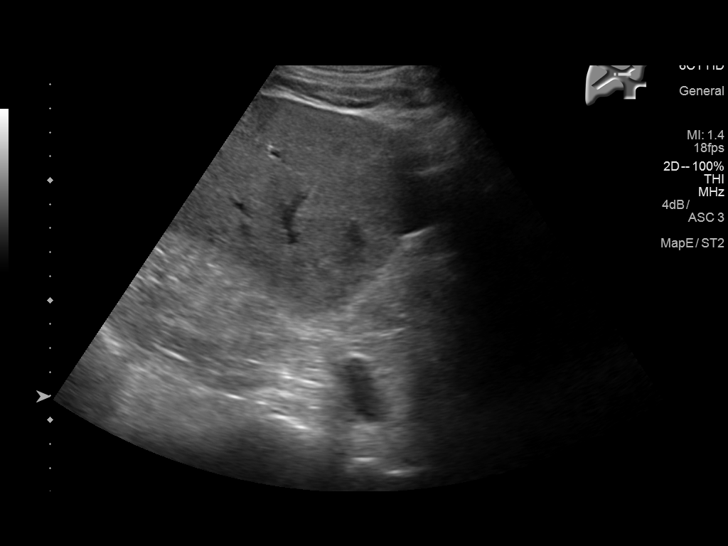
[im 11/33]
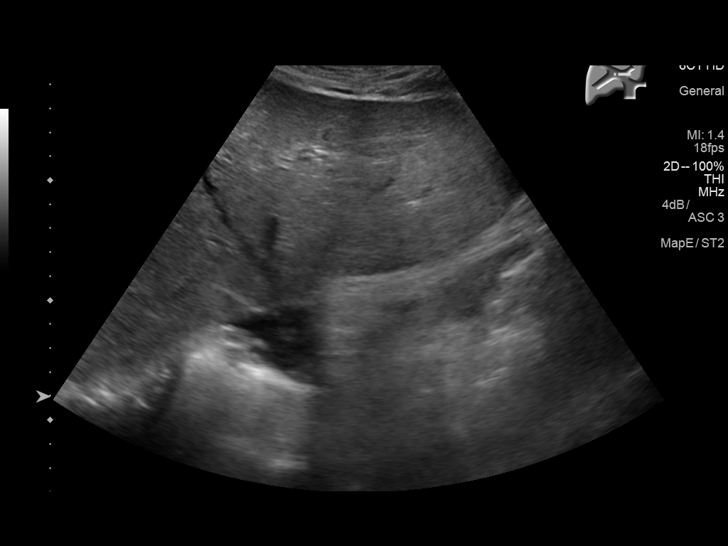
[im 13/33]
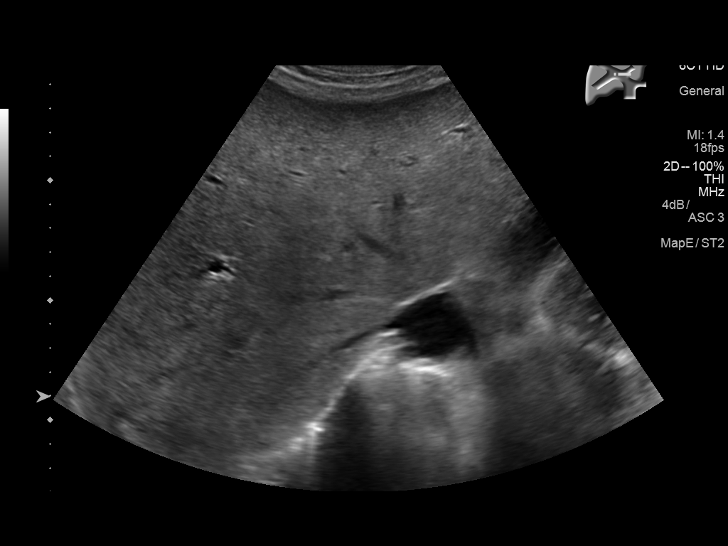
[im 15/33]
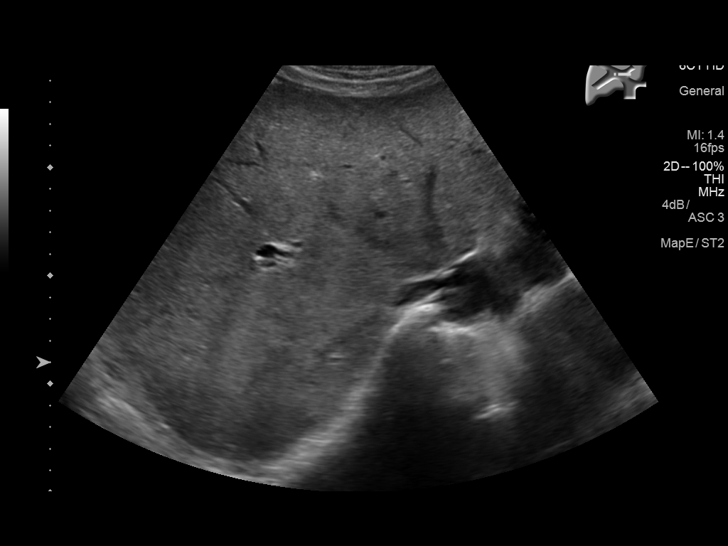
[im 18/33]
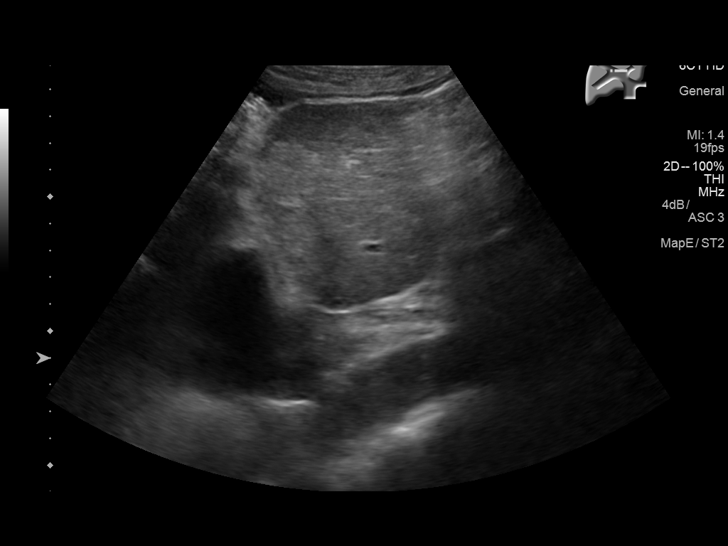
[im 21/33]
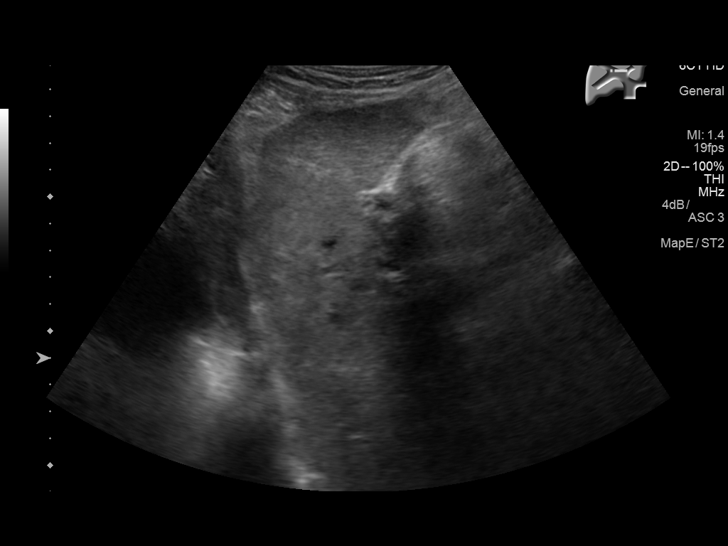
[im 22/33]
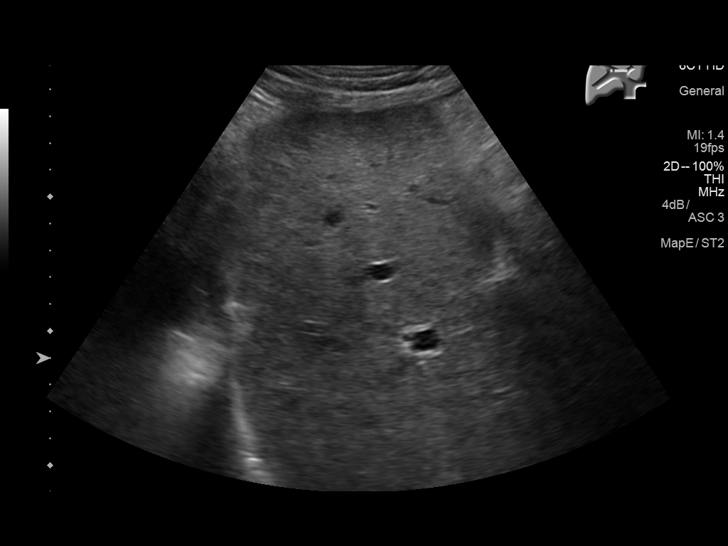
[im 25/33]
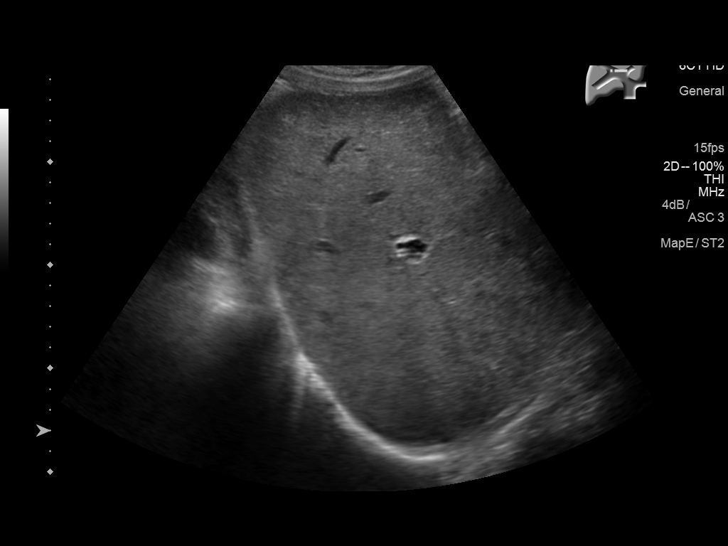
[im 27/33]
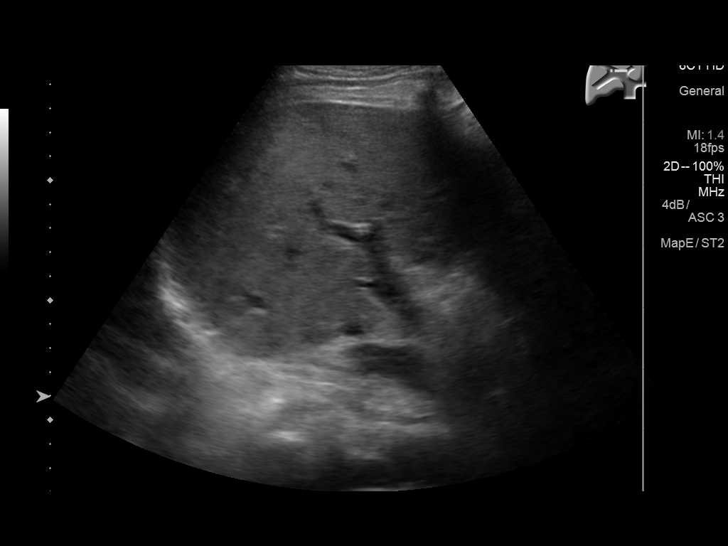
[im 30/33]
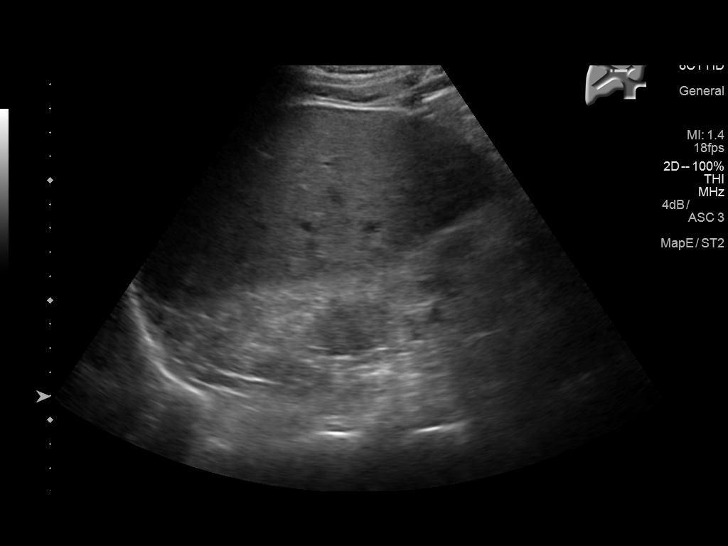
[im 33/33]
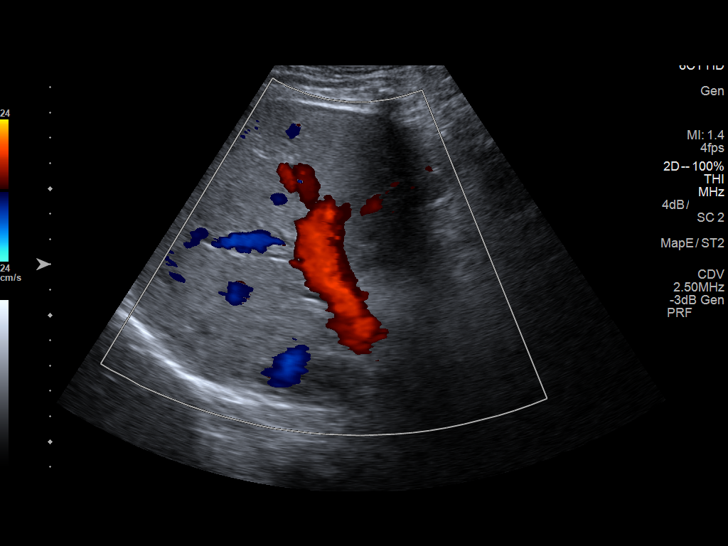

[14 of 25 positions shown; findings below may reference images not displayed]

FINDINGS: Gallbladder:

Prior cholecystectomy.

Common bile duct:

Diameter: 6.5 mm

Liver:

No focal lesion identified. Within normal limits in parenchymal
echogenicity. Portal vein is patent on color Doppler imaging with
normal direction of blood flow towards the liver.
IMPRESSION: 1. Prior cholecystectomy.
2. Otherwise normal right upper quadrant ultrasound.

## 2019-04-07 ENCOUNTER — Other Ambulatory Visit: Payer: Self-pay | Admitting: Cardiology

## 2019-04-07 MED ORDER — DILTIAZEM HCL ER COATED BEADS 120 MG PO CP24: 120.0000 mg | ORAL_CAPSULE | Freq: Two times a day (BID) | ORAL | 9 refills | Status: DC

## 2019-04-10 DIAGNOSIS — R338 Other retention of urine: Secondary | ICD-10-CM | POA: Diagnosis not present

## 2019-04-15 ENCOUNTER — Telehealth: Payer: Self-pay | Admitting: Cardiology

## 2019-04-15 NOTE — Telephone Encounter (Signed)
Attempted to call the pt back, to further assist with wanting to switch his OV with Dr. Curt Bears on 1/21 to a virtual visit.  Pt did not answer and VM is full at this time.

## 2019-04-15 NOTE — Telephone Encounter (Signed)
New Message:      Pt says he is having urinary problems. He wants to know if he can have a phone visit on 04-17-19. He is not able to come in.

## 2019-04-15 NOTE — Telephone Encounter (Signed)
Pt has 1/21 f/u with Dr. Curt Bears per 12/2018 ov note.

## 2019-04-16 NOTE — Telephone Encounter (Signed)
Pt reports hasn't been feeling well this week.  He is also having some balance issues.  He prefers virtual visit in case he is "sick". Pt made aware ok for virtual visit tomorrow. appt changed. Pt appreciates our help/consideration with this.

## 2019-04-17 ENCOUNTER — Other Ambulatory Visit: Payer: Self-pay

## 2019-04-17 ENCOUNTER — Telehealth (INDEPENDENT_AMBULATORY_CARE_PROVIDER_SITE_OTHER): Payer: Medicare Other | Admitting: Cardiology

## 2019-04-17 DIAGNOSIS — I4891 Unspecified atrial fibrillation: Secondary | ICD-10-CM | POA: Diagnosis not present

## 2019-04-17 NOTE — Telephone Encounter (Signed)

## 2019-04-17 NOTE — Progress Notes (Signed)
Electrophysiology TeleHealth Note   Due to national recommendations of social distancing due to COVID 19, an audio/video telehealth visit is felt to be most appropriate for this patient at this time.  See Epic message for the patient's consent to telehealth for [facility].   Date:  04/17/2019   ID:  Princetyn Larick, DOB 10/06/1940, MRN [id number]  Location: patient's home  Provider location: [address], [place]  Evaluation Performed: Follow-up visit  PCP:  Scot Jun, FNP  Cardiologist:  Glenetta Hew, MD  Electrophysiologist:  Dr Curt Bears  Chief Complaint:  AF  History of Present Illness:    Dashawn Bicknell is a 79 y.o. male who presents via audio/video conferencing for a telehealth visit today.  Since last being seen in our clinic, the patient reports doing very well.  Today, he denies symptoms of palpitations, chest pain, shortness of breath,  lower extremity edema, dizziness, presyncope, or syncope.  The patient is otherwise without complaint today.  The patient denies symptoms of fevers, chills, cough, or new SOB worrisome for COVID 19.  He has a history significant for diastolic heart failure, COPD, diabetes, hypertension, hyperlipidemia, atrial fibrillation/flutter status post ablation, GI bleed not anticoagulated.  He is now status post [place] dual-chamber pacemaker due to tachybradycardia syndrome.  Today, denies symptoms of palpitations, chest pain, shortness of breath, orthopnea, PND, lower extremity edema, claudication, dizziness, presyncope, syncope, bleeding, or neurologic sequela. The patient is tolerating medications without difficulties.  Since his pacemaker was implanted he has felt much better.  He has more energy.  His heart rates have been better controlled, mostly in the 80s, down from the low 100s.  Past Medical History:  Diagnosis Date  . Allergic rhinitis   . Anemia    takes Ferrous Sulfate daily  . Arthritis    "all over"    . Atrial flutter ([place])   . Balance problem 01/2014  . CAP (community acquired pneumonia) 09/18/2014  . Cervical radiculopathy due to degenerative joint disease of spine   . Chronic coronary artery disease    a. 03/2010 Nonocclusive disease by cath, performed for ST elevations on ECG;  b. 06/2013 Lexi MV: EF 60%, no ischemia.  [name] COPD (chronic obstructive pulmonary disease) ([place])   . Diabetes mellitus type II    takes Metformin and Lantus daily  . Diastolic CHF, chronic ([place])    a. 12/2012 EF 55-60%, diast dysfxn, triv MR, mildly dil LA/RA.  [name] Dyspnea    with pain  . Essential hypertension    takes Diltiazem daily  . Frail elderly   . GERD (gastroesophageal reflux disease)   . History of blood transfusion 1982   "when I had stomach OR"  . History of bronchitis    1998  . History of CVA (cerebrovascular accident)   . History of gastric ulcer   . History of kidney stones   . Hypercholesterolemia    takes Pravastatin daily  . Intrinsic eczema   . Legally blind in left eye, as defined in [place]   . Obesity   . PAF (paroxysmal atrial fibrillation) ([facility])    Recurrent after atrial flutter (a. 07/2010 Status post caval tricuspid isthmus ablation by Dr. Midge Aver Metoprolol daily), currently controlled on flecainide plus diltiazem and Coumadin  . Urinary retention   . Vitamin D deficiency   . Weakness    numbness and tingling both hands    Past Surgical History:  Procedure Laterality Date  . ATRIAL ABLATION SURGERY  08/05/10   CTI ablation for atrial flutter by JA  . CARDIAC CATHETERIZATION  2012   nl LV function, no occlusive CAD, PAF  . CARDIOVERSION  12/07/2010    Successful direct current cardioversion with atrial fibrillation to normal sinus rhythm  . CARPAL TUNNEL RELEASE Bilateral 01/30/2014   Procedure: BILATERAL CARPAL TUNNEL RELEASE;  Surgeon: Marianna Payment, MD;  Location: [place];  Service: Orthopedics;  Laterality: Bilateral;  . CATARACT EXTRACTION W/ INTRAOCULAR LENS   IMPLANT, BILATERAL Bilateral   . COLONOSCOPY [name] 12/02/2013   Procedure: COLONOSCOPY;  Surgeon: Irene Shipper, MD;  Location: [place];  Service: Endoscopy;  Laterality: [name];  . ESOPHAGOGASTRODUODENOSCOPY [name] 09/22/2014   Procedure: ESOPHAGOGASTRODUODENOSCOPY (EGD);  Surgeon: Ronald Lobo, MD;  Location[facility]COPY;  Service: Endoscopy;  Laterality: [name];  . ESOPHAGOGASTRODUODENOSCOPY [name] 07/11/2016   Procedure: ESOPHAGOGASTRODUODENOSCOPY (EGD);  Surgeon: Doran Stabler, MD;  Location: [facility] ENDOSCOPY;  Service: Endoscopy;  Laterality: [name];  . EXTRACORPOREAL SHOCK WAVE LITHOTRIPSY Right 07/15/2018   Procedure: EXTRACORPOREAL SHOCK WAVE LITHOTRIPSY (ESWL);  Surgeon: Lucas Mallow, MD;  Location: [facility];  Service: Urology;  Laterality: Right;  . INCISION AND DRAINAGE ABSCESS / HEMATOMA OF BURSA / KNEE / THIGH Left 1998   knee  . KNEE BURSECTOMY Left 1998  . LAPAROSCOPIC CHOLECYSTECTOMY  03/2010  . LEFT HEART CATH AND CORONARY ANGIOGRAPHY [name] 10/05/2017   Procedure: LEFT HEART CATH AND CORONARY ANGIOGRAPHY;  Surgeon: Martinique, Peter M, MD;  Location: [place] CV LAB;  Service: Cardiovascular;  Laterality: [name];  . NM MYOVIEW LTD  07/22/2013   Normal EF ~60%, no ischemia or infarction.  [name] PACEMAKER IMPLANT [name] 10/21/2018   Procedure: PACEMAKER IMPLANT;  Surgeon: Deboraha Sprang, MD;  Location: [place] [facility];  Service: Cardiovascular;  Laterality: [name];  . PARTIAL GASTRECTOMY  1982   subtotal; "took out 30% for ulcers"  . TRANSTHORACIC ECHOCARDIOGRAM  02/16/2014   EF 60%, no RWMA. - otherwise normal  . YAG LASER APPLICATION Bilateral     Current Outpatient Medications  Medication Sig Dispense Refill  . Accu-Chek FastClix Lancets MISC USE TO TEST BLOOD SUGAR TWICE DAILY. E11.00 102 each 0  . aspirin EC 81 MG tablet Take 81 mg by mouth daily.    . citalopram (CELEXA) 20 MG tablet Take 1 tablet (20 mg total) by mouth daily. 90 tablet 1  . diclofenac sodium (VOLTAREN) 1 % GEL APPLY 2  GRAMS TOPICALLY 2 TIMES DAILY.    [name] diclofenac Sodium (VOLTAREN) 1 % GEL Apply 2 g topically 4 (four) times daily. 100 g 4  . diltiazem (CARDIZEM CD) 120 MG 24 hr capsule Take 1 capsule (120 mg total) by mouth 2 (two) times daily. 60 capsule 9  . ferrous sulfate 325 (65 FE) MG tablet Take 325 mg by mouth daily with breakfast.    . glimepiride (AMARYL) 2 MG tablet TAKE 1 TABLET (2 MG TOTAL) BY MOUTH DAILY BEFORE BREAKFAST. 90 tablet 1  . glucose blood (ACCU-CHEK GUIDE) test strip USE TO TEST BLOOD SUGAR TWICE DAILY. E11.00 100 each 0  . nitroGLYCERIN (NITROSTAT) 0.4 MG SL tablet Place 1 tablet (0.4 mg total) under the tongue every 5 (five) minutes x 3 doses as needed for chest pain. 25 tablet 9  . pantoprazole (PROTONIX) 40 MG tablet Take 1 tablet (40 mg total) by mouth 2 (two) times daily before a meal. 180 tablet 1  . pravastatin (PRAVACHOL) 40 MG tablet Take 1 tablet (40 mg total) by  mouth every evening. 90 tablet 3  . tamsulosin (FLOMAX) 0.4 MG CAPS capsule Take 1 capsule (0.4 mg total) by mouth daily. 30 capsule 3   No current facility-administered medications for this visit.    Allergies:   Cyclobenzaprine and Other   Social History:  The patient  reports that he has been smoking cigarettes. He has a 14.25 pack-year smoking history. He has never used smokeless tobacco. He reports that he does not drink alcohol or use drugs.   Family History:  The patient's  family history includes Breast cancer in his mother; Diabetes in his mother; Heart attack in his brother and father; Heart disease in his father; Kidney disease in his mother; Leukemia in his daughter.   ROS:  Please see the history of present illness.   All other systems are personally reviewed and negative.    Exam:    Vital Signs:  Pulse 86   Over the phone, no acute distress, no shortness of breath.  Labs/Other Tests and Data Reviewed:    Recent Labs: 07/10/2018: ALT 13 10/20/2018: Magnesium 1.9; TSH 1.468 10/22/2018: BUN  12; Creatinine, Ser 0.96; Hemoglobin 11.1; Platelets 210; Potassium 3.7; Sodium 136   Wt Readings from Last 3 Encounters:  01/21/19 171 lb 3.2 oz (77.7 kg)  11/22/18 158 lb (71.7 kg)  11/14/18 160 lb (72.6 kg)     Other studies personally reviewed: Additional studies/ records that were reviewed today include: ECG 01/21/19  Review of the above records today demonstrates:  AF, V paced  Last device remote is reviewed from [place] PDF dated 11/05/18 which reveals normal device function, no arrhythmias AF   ASSESSMENT & PLAN:    1.  Atypical atrial fibrillation/flutter: Has had recurrent GI bleeds and thus is not anticoagulated.  Is now status post [facility] Jude dual-chamber pacemaker with Medtronic leads.  Currently on diltiazem.  We Jnaya Butrick continue with current management.  We Gilmore List try to get him set up for remote checks.  He did say that he has a spot beside his pacemaker.  His daughter Deloy Archey take a picture and send it in.  2.  Chronic diastolic heart failure: No obvious volume overload over the phone.  No changes.  3.  Tobacco abuse: Cessation encouraged  4.  Tachybradycardia syndrome: Status post [employer] dual-chamber pacemaker with Medtronic 5076 leads.  Device functioning appropriately on most recent check.  No changes.   COVID 19 screen The patient denies symptoms of COVID 19 at this time.  The importance of social distancing was discussed today.  Follow-up: 6 months  Current medicines are reviewed at length with the patient today.   The patient does not have concerns regarding his medicines.  The following changes were made today:  none  Labs/ tests ordered today include:  No orders of the defined types were placed in this encounter.    Patient Risk:  after full review of this patients clinical status, I feel that they are at moderate risk at this time.  Today, I have spent 9 minutes with the patient with telehealth technology discussing pacemaker.    Signed, Tyana Butzer Meredith Leeds, MD  04/17/2019 9:40 AM     [facility] [address]  [place] [address] [phone] (office) [phone] (fax)

## 2019-04-21 ENCOUNTER — Ambulatory Visit (INDEPENDENT_AMBULATORY_CARE_PROVIDER_SITE_OTHER): Payer: Medicare Other | Admitting: *Deleted

## 2019-04-21 DIAGNOSIS — I4891 Unspecified atrial fibrillation: Secondary | ICD-10-CM | POA: Diagnosis not present

## 2019-04-21 LAB — CUP PACEART REMOTE DEVICE CHECK
Battery Remaining Longevity: 101 mo
Battery Remaining Percentage: 95.5 %
Battery Voltage: 3.01 V
Brady Statistic AP VP Percent: 4.7 %
Brady Statistic AP VS Percent: 3.4 %
Brady Statistic AS VP Percent: 8.3 %
Brady Statistic AS VS Percent: 80 %
Brady Statistic RA Percent Paced: 1.9 %
Brady Statistic RV Percent Paced: 14 %
Date Time Interrogation Session: 20210125050234
Implantable Lead Implant Date: 20200727
Implantable Lead Implant Date: 20200727
Implantable Lead Location: 753859
Implantable Lead Location: 753860
Implantable Lead Model: 5076
Implantable Lead Model: 5076
Implantable Pulse Generator Implant Date: 20200727
Lead Channel Impedance Value: 430 Ohm
Lead Channel Impedance Value: 580 Ohm
Lead Channel Pacing Threshold Amplitude: 0.75 V
Lead Channel Pacing Threshold Pulse Width: 0.5 ms
Lead Channel Sensing Intrinsic Amplitude: 12 mV
Lead Channel Sensing Intrinsic Amplitude: 5 mV
Lead Channel Setting Pacing Amplitude: 2.5 V
Lead Channel Setting Pacing Amplitude: 3.5 V
Lead Channel Setting Pacing Pulse Width: 0.5 ms
Lead Channel Setting Sensing Sensitivity: 2 mV
Pulse Gen Model: 2272
Pulse Gen Serial Number: 9154154

## 2019-04-22 ENCOUNTER — Other Ambulatory Visit: Payer: Self-pay

## 2019-04-22 ENCOUNTER — Ambulatory Visit (INDEPENDENT_AMBULATORY_CARE_PROVIDER_SITE_OTHER): Payer: Medicare Other | Admitting: Internal Medicine

## 2019-04-22 VITALS — BP 135/76 | HR 78 | Temp 98.3°F | Resp 18 | Ht 65.0 in | Wt 175.0 lb

## 2019-04-22 DIAGNOSIS — E1142 Type 2 diabetes mellitus with diabetic polyneuropathy: Secondary | ICD-10-CM | POA: Diagnosis not present

## 2019-04-22 DIAGNOSIS — I480 Paroxysmal atrial fibrillation: Secondary | ICD-10-CM | POA: Diagnosis not present

## 2019-04-22 DIAGNOSIS — I5032 Chronic diastolic (congestive) heart failure: Secondary | ICD-10-CM | POA: Diagnosis not present

## 2019-04-22 DIAGNOSIS — J441 Chronic obstructive pulmonary disease with (acute) exacerbation: Secondary | ICD-10-CM | POA: Diagnosis not present

## 2019-04-22 DIAGNOSIS — Z23 Encounter for immunization: Secondary | ICD-10-CM

## 2019-04-22 DIAGNOSIS — I10 Essential (primary) hypertension: Secondary | ICD-10-CM | POA: Diagnosis not present

## 2019-04-22 DIAGNOSIS — I25118 Atherosclerotic heart disease of native coronary artery with other forms of angina pectoris: Secondary | ICD-10-CM

## 2019-04-22 LAB — POCT GLYCOSYLATED HEMOGLOBIN (HGB A1C): Hemoglobin A1C: 6.4 % — AB (ref 4.0–5.6)

## 2019-04-22 LAB — GLUCOSE, POCT (MANUAL RESULT ENTRY): POC Glucose: 140 mg/dl — AB (ref 70–99)

## 2019-04-22 MED ORDER — GABAPENTIN 100 MG PO CAPS: 100.0000 mg | ORAL_CAPSULE | Freq: Every day | ORAL | 3 refills | Status: DC

## 2019-04-22 MED ORDER — ACCU-CHEK GUIDE ME W/DEVICE KIT: 1.0000 | PACK | Freq: Once | 0 refills | Status: AC

## 2019-04-22 MED ORDER — GLIMEPIRIDE 2 MG PO TABS: 1.0000 mg | ORAL_TABLET | Freq: Every day | ORAL | 1 refills | Status: DC

## 2019-04-22 NOTE — Progress Notes (Signed)
Patient complains of diarrhea with the iron supplement. Patient attempted half a pill and the same result occurred.

## 2019-04-22 NOTE — Patient Instructions (Signed)
Decrease glimepiride to half a tablet daily.  This is the medication for your diabetes.  I have sent a prescription to your pharmacy for glucometer so that you can check your blood sugars.  If you find that your blood sugars are dropping below 90 a lot, please give [place] a call.   We have added a medication called gabapentin to take at bedtime to help decrease the numbness in your toes.

## 2019-04-22 NOTE — Progress Notes (Signed)
Patient ID: [name], male    DOB: 11-06-1940  MRN: [id number]  CC: Diabetes   Subjective: [name] is a 79 y.o. male who presents for chronic ds management at [facility].  Pt is alone today.  Daughter waiting for him in the car His concerns today include:  Patient with history of diastolic CHF, paroxysmal atrial fibrillation, a flutter (not on anticoagulation due to history of GI bleed), COPD, DM, HTN, HL, nonobstructive CAD, pacemaker, anemia of chronic disease, GI bleed, chronic urinary catheter  DIABETES TYPE 2 Last A1C:   Results for orders placed or performed in visit on 04/22/19  HgB A1c  Result Value Ref Range   Hemoglobin A1C 6.4 (A) 4.0 - 5.6 %   HbA1c POC (<> result, manual entry)     HbA1c, POC (prediabetic range)     HbA1c, POC (controlled diabetic range)    Glucose (CBG)  Result Value Ref Range   POC Glucose 140 (A) 70 - 99 mg/dl    Med Adherence:  [x]  Yes    []  No Medication side effects:  []  Yes    [x]  No Home Monitoring?  []  Yes    [x]  No, no glucometer but has stripes and lancets Home glucose results range: Diet Adherence: [x]  Yes -eat 4 x a day but stay away from sweets Exercise: []  Yes    [x]  No  Hypoglycemic episodes?: [x]  Yes  -thinks it happens every day.  He gets a weak feeling and has to eat something then feels better.  Has test stripes but no glucometer.  Would like to get one to test BS Numbness of the feet? [x]  Yes in toes which he has had for a while and is bothersome to him   []  No Retinopathy hx? []  Yes    [x]  No Last eye exam:  Over due. Declines referral at this time Comments:   HYPERTENSION/CHF/a.fib/CAD Currently taking: see medication list.  He is on diltiazem, pravastatin and aspirin.  Not on any diuretic.  Had a telemedicine visit with his cardiologist Dr. Curt Bears 04/17/19 Med Adherence: [x]  Yes    []  No Medication side effects: []  Yes    [x]  No Adherence with salt restriction: [x]  Yes    []  No Home Monitoring?: [x]  Yes    he reports readings of 80s/80s.  Took meds already today SOB? []  Yes    [x]  No Chest Pain?: []  Yes    [x]  No Leg swelling?: []  Yes    [x]  No Headaches?: []  Yes    [x]  No Dizziness? []  Yes    [x]  No Comments:  No palpitations.  Not on anticoagulation due to history of GI bleed  COPD:  Not on any inhalers.  Quit smoking 3 mths ago.  Feels his breathing is good.  He does not feel he needs an inhaler.  Patient Active Problem List   Diagnosis Date Noted  . Cardiac pacemaker in situ 10/29/2018  . Major depressive disorder, recurrent severe without psychotic features ([place]) 04/15/2018  . Depression   . Atrial fibrillation with RVR ([place]) 11/08/2017  . HTN (hypertension) 05/21/2017  . Diabetes mellitus type II 05/21/2017  . Diastolic CHF, chronic ([place]) 05/21/2017  . COPD (chronic obstructive pulmonary disease) ([place]) 05/21/2017  . H/O atrial flutter 05/21/2017  . Abdominal pain 10/02/2016  . Constipation 10/02/2016  . Left lower quadrant pain   . Heme positive stool   . Symptomatic anemia 07/10/2016  . Cardiac syncope 05/15/2015  . Cervical disc disease  05/15/2015  . Anemia 05/15/2015  . History of CVA (cerebrovascular accident) 04/03/2014  . Benign neoplasm of rectum and anal canal 12/02/2013  . Upper GI bleeding 11/30/2013  . Tobacco use disorder 11/30/2013  . Type 2 diabetes mellitus ([place]) 01/22/2013  . Hyperlipidemia   . Obesity (BMI 30-39.9) 12/14/2012  . Persistent atrial fibrillation ([place]) 10/07/2010  . Atrial flutter - status post CTI ablation 07/29/2010  . Essential hypertension 07/29/2010  . Chronic diastolic congestive heart failure ([place]) 07/29/2010  . Atherosclerotic heart disease of native coronary artery with other forms of angina pectoris ([place]) 07/29/2010     Current Outpatient Medications on File Prior to Visit  Medication Sig Dispense Refill  . Accu-Chek FastClix Lancets MISC USE TO TEST BLOOD SUGAR TWICE DAILY. E11.00 102 each 0  . aspirin EC 81 MG tablet Take 81  mg by mouth daily.    . citalopram (CELEXA) 20 MG tablet Take 1 tablet (20 mg total) by mouth daily. 90 tablet 1  . diclofenac sodium (VOLTAREN) 1 % GEL APPLY 2 GRAMS TOPICALLY 2 TIMES DAILY.    [name] diclofenac Sodium (VOLTAREN) 1 % GEL Apply 2 g topically 4 (four) times daily. 100 g 4  . diltiazem (CARDIZEM CD) 120 MG 24 hr capsule Take 1 capsule (120 mg total) by mouth 2 (two) times daily. 60 capsule 9  . glucose blood (ACCU-CHEK GUIDE) test strip USE TO TEST BLOOD SUGAR TWICE DAILY. E11.00 100 each 0  . nitroGLYCERIN (NITROSTAT) 0.4 MG SL tablet Place 1 tablet (0.4 mg total) under the tongue every 5 (five) minutes x 3 doses as needed for chest pain. 25 tablet 9  . pantoprazole (PROTONIX) 40 MG tablet Take 1 tablet (40 mg total) by mouth 2 (two) times daily before a meal. 180 tablet 1  . pravastatin (PRAVACHOL) 40 MG tablet Take 1 tablet (40 mg total) by mouth every evening. 90 tablet 3  . tamsulosin (FLOMAX) 0.4 MG CAPS capsule Take 1 capsule (0.4 mg total) by mouth daily. 30 capsule 3  . ferrous sulfate 325 (65 FE) MG tablet Take 325 mg by mouth daily with breakfast.     No current facility-administered medications on file prior to visit.    Allergies  Allergen Reactions  . Cyclobenzaprine Other (See Comments)    Unsteady gait  . Other Other (See Comments)    Steroids speeds his heart rate    Social History   Socioeconomic History  . Marital status: Widowed    Spouse name: Not on file  . Number of children: Not on file  . Years of education: Not on file  . Highest education level: Not on file  Occupational History  . Occupation: Retired  Tobacco Use  . Smoking status: Current Every Day Smoker    Packs/day: 0.25    Years: 57.00    Pack years: 14.25    Types: Cigarettes  . Smokeless tobacco: Never Used  . Tobacco comment: He's been smoking between 0.5 and 2 ppd since age 55. QUIT 3 MONTHS AGO  Substance and Sexual Activity  . Alcohol use: No    Alcohol/week: 0.0 standard  drinks    Comment: "quit drinking in 1986"  . Drug use: No  . Sexual activity: Not Currently  Other Topics Concern  . Not on file  Social History Narrative   He is a widower, who moved to [place] in 2011 to live closer to his daughter. He formerly lived in [place]. He is a father of 21, grandfather,  and great-grandfather of 55. Does not drink alcohol, context of smoker.   Social Determinants of Health   Financial Resource Strain:   . Difficulty of Paying Living Expenses: Not on file  Food Insecurity:   . Worried About Charity fundraiser in the Last Year: Not on file  . Ran Out of Food in the Last Year: Not on file  Transportation Needs:   . Lack of Transportation (Medical): Not on file  . Lack of Transportation (Non-Medical): Not on file  Physical Activity:   . Days of Exercise per Week: Not on file  . Minutes of Exercise per Session: Not on file  Stress:   . Feeling of Stress : Not on file  Social Connections:   . Frequency of Communication with Friends and Family: Not on file  . Frequency of Social Gatherings with Friends and Family: Not on file  . Attends Religious Services: Not on file  . Active Member of Clubs or Organizations: Not on file  . Attends Archivist Meetings: Not on file  . Marital Status: Not on file  Intimate Partner Violence:   . Fear of Current or Ex-Partner: Not on file  . Emotionally Abused: Not on file  . Physically Abused: Not on file  . Sexually Abused: Not on file    Family History  Problem Relation Age of Onset  . Breast cancer Mother   . Diabetes Mother   . Kidney disease Mother   . Heart attack Father   . Heart disease Father   . Heart attack Brother   . Leukemia Daughter     Past Surgical History:  Procedure Laterality Date  . ATRIAL ABLATION SURGERY  08/05/10   CTI ablation for atrial flutter by [facility]  . CARDIAC CATHETERIZATION  2012   nl LV function, no occlusive CAD, PAF  . CARDIOVERSION  12/07/2010    Successful  direct current cardioversion with atrial fibrillation to normal sinus rhythm  . CARPAL TUNNEL RELEASE Bilateral 01/30/2014   Procedure: BILATERAL CARPAL TUNNEL RELEASE;  Surgeon: Marianna Payment, MD;  Location: [facility];  Service: Orthopedics;  Laterality: Bilateral;  . CATARACT EXTRACTION W/ INTRAOCULAR LENS  IMPLANT, BILATERAL Bilateral   . COLONOSCOPY [name] 12/02/2013   Procedure: COLONOSCOPY;  Surgeon: Irene Shipper, MD;  Location: [facility];  Service: Endoscopy;  Laterality: [name];  . ESOPHAGOGASTRODUODENOSCOPY [name] 09/22/2014   Procedure: ESOPHAGOGASTRODUODENOSCOPY (EGD);  Surgeon: Ronald Lobo, MD;  Location[facility];  Service: Endoscopy;  Laterality: [name];  . ESOPHAGOGASTRODUODENOSCOPY [name] 07/11/2016   Procedure: ESOPHAGOGASTRODUODENOSCOPY (EGD);  Surgeon: Doran Stabler, MD;  Location: [facility] ENDOSCOPY;  Service: Endoscopy;  Laterality: [name];  . EXTRACORPOREAL SHOCK WAVE LITHOTRIPSY Right 07/15/2018   Procedure: EXTRACORPOREAL SHOCK WAVE LITHOTRIPSY (ESWL);  Surgeon: Lucas Mallow, MD;  Location: [facility];  Service: Urology;  Laterality: Right;  . INCISION AND DRAINAGE ABSCESS / HEMATOMA OF BURSA / KNEE / THIGH Left 1998   knee  . KNEE BURSECTOMY Left 1998  . LAPAROSCOPIC CHOLECYSTECTOMY  03/2010  . LEFT HEART CATH AND CORONARY ANGIOGRAPHY [name] 10/05/2017   Procedure: LEFT HEART CATH AND CORONARY ANGIOGRAPHY;  Surgeon: Martinique, Peter M, MD;  Location: [facility];  Service: Cardiovascular;  Laterality: [name];  . NM MYOVIEW LTD  07/22/2013   Normal EF ~60%, no ischemia or infarction.  [name] PACEMAKER IMPLANT [name] 10/21/2018   Procedure: PACEMAKER IMPLANT;  Surgeon: Deboraha Sprang, MD;  Location: [facility];  Service: Cardiovascular;  Laterality:  [name];  . PARTIAL GASTRECTOMY  1982   subtotal; "took out 30% for ulcers"  . TRANSTHORACIC ECHOCARDIOGRAM  02/16/2014   EF 60%, no RWMA. - otherwise normal  . YAG LASER APPLICATION Bilateral     ROS: Review of Systems Negative except as  stated above  PHYSICAL EXAM: BP 135/76 (BP Location: Left Arm, Patient Position: Sitting, Cuff Size: Normal)   Pulse 78   Temp 98.3 F (36.8 C) (Oral)   Resp 18   Ht 5' 5"  (1.651 m)   Wt 175 lb (79.4 kg)   SpO2 98%   BMI 29.12 kg/m   Wt Readings from Last 3 Encounters:  04/22/19 175 lb (79.4 kg)  01/21/19 171 lb 3.2 oz (77.7 kg)  11/22/18 158 lb (71.7 kg)    Physical Exam General appearance -pleasant elderly Caucasian male in NAD. Mental status - normal mood, behavior, speech, dress, motor activity, and thought processes Eyes - pupils equal and reactive, extraocular eye movements intact Mouth - mucous membranes moist, pharynx normal without lesions Neck - supple, no significant adenopathy Chest - clear to auscultation, no wheezes, rales or rhonchi, symmetric air entry Heart -he sounds to be in sinus rhythm.  No gallops or murmurs Extremities -trace lower extremity edema right greater than left Diabetic Foot Exam - Simple   Simple Foot Form Visual Inspection See comments: Yes Sensation Testing Intact to touch and monofilament testing bilaterally: Yes Pulse Check Posterior Tibialis and Dorsalis pulse intact bilaterally: Yes Comments Toenails are thickened and discolored.  A few are slightly overgrown.     CMP Latest Ref Rng & Units 10/22/2018 10/20/2018 10/19/2018  Glucose 70 - 99 mg/dL 160(H) 151(H) 236(H)  BUN 8 - 23 mg/dL 12 13 16   Creatinine 0.61 - 1.24 mg/dL 0.96 1.05 1.20  Sodium 135 - 145 mmol/L 136 138 136  Potassium 3.5 - 5.1 mmol/L 3.7 3.9 4.2  Chloride 98 - 111 mmol/L 105 106 104  CO2 22 - 32 mmol/L 23 23 23   Calcium 8.9 - 10.3 mg/dL 8.5(L) 8.4(L) 9.3  Total Protein 6.5 - 8.1 g/dL - - -  Total Bilirubin 0.3 - 1.2 mg/dL - - -  Alkaline Phos 38 - 126 U/L - - -  AST 15 - 41 U/L - - -  ALT 0 - 44 U/L - - -   Lipid Panel     Component Value Date/Time   CHOL 135 10/02/2018 0933   TRIG 94 10/02/2018 0933   HDL 47 10/02/2018 0933   CHOLHDL 2.9 10/02/2018  0933   CHOLHDL 2.2 11/09/2017 0040   VLDL 27 11/09/2017 0040   LDLCALC 69 10/02/2018 0933    CBC    Component Value Date/Time   WBC 5.5 10/22/2018 0650   RBC 4.14 (L) 10/22/2018 0650   HGB 11.1 (L) 10/22/2018 0650   HGB 10.9 (L) 10/02/2018 0933   HGB 11.8 (L) 01/12/2017 0939   HCT 35.3 (L) 10/22/2018 0650   HCT 36.2 (L) 10/02/2018 0933   HCT 37.6 (L) 01/12/2017 0939   PLT 210 10/22/2018 0650   PLT 286 10/02/2018 0933   MCV 85.3 10/22/2018 0650   MCV 87 10/02/2018 0933   MCV 92 01/12/2017 0939   MCH 26.8 10/22/2018 0650   MCHC 31.4 10/22/2018 0650   RDW 14.9 10/22/2018 0650   RDW 15.3 10/02/2018 0933   RDW 19.2 (H) 01/12/2017 0939   LYMPHSABS 0.7 10/07/2018 0908   LYMPHSABS 0.6 (L) 10/02/2018 0933   LYMPHSABS 0.9 01/12/2017 0939   MONOABS  0.4 10/07/2018 0908   EOSABS 0.1 10/07/2018 0908   EOSABS 0.1 10/02/2018 0933   EOSABS 0.1 01/12/2017 0939   BASOSABS 0.1 10/07/2018 0908   BASOSABS 0.1 10/02/2018 0933   BASOSABS 0.0 01/12/2017 0939    ASSESSMENT AND PLAN: 1. Type 2 diabetes mellitus with diabetic polyneuropathy, without long-term current use of insulin (HCC) A1c is at goal which is less than 7.  However he reports frequent hypoglycemic episodes but does not have a glucometer to check blood sugars.  I recommend decreasing Amaryl to 2 mg half a tablet daily.  Prescription sent to his pharmacy for glucometer so that he can check blood sugars.  Told him that if blood sugars are staying above 90 he is doing good. - HgB A1c - Glucose (CBG) - gabapentin (NEURONTIN) 100 MG capsule; Take 1 capsule (100 mg total) by mouth at bedtime.  Dispense: 90 capsule; Refill: 3 - glimepiride (AMARYL) 2 MG tablet; Take 0.5 tablets (1 mg total) by mouth daily before breakfast.  Dispense: 90 tablet; Refill: 1 - Blood Glucose Monitoring Suppl (ACCU-CHEK GUIDE ME) w/Device KIT; 1 each by Does not apply route once for 1 dose.  Dispense: 1 kit; Refill: 0  2. Essential hypertension Diastolic  blood pressure today above goal.  He reports home blood pressure readings that seem very low.  I have requested that he has his daughter check the blood pressure and if it is running as low as he said is that she give [place] a call.  He does not have any symptoms of dizziness.  3. Chronic diastolic heart failure ([place]) Weight is up from August of last year.  However on exam he is compensated.  Advised to continue low-salt diet.  Followed by cardiology  4. Atherosclerotic heart disease of native coronary artery with other forms of angina pectoris ([place]) Clinically stable  5. PAF (paroxysmal atrial fibrillation) (HCC) Currently sounds to be in sinus rhythm.  Not on anticoagulation due to past histories of GI bleed  6. COPD with exacerbation ([place]) Stable without inhalers.  Commended him on quitting smoking.  Encouraged him to remain tobacco free  7. Need for Tdap vaccination Given today     Patient was given the opportunity to ask questions.  Patient verbalized understanding of the plan and was able to repeat key elements of the plan.   Orders Placed This Encounter  Procedures  . HgB A1c  . Glucose (CBG)     Requested Prescriptions   Signed Prescriptions Disp Refills  . gabapentin (NEURONTIN) 100 MG capsule 90 capsule 3    Sig: Take 1 capsule (100 mg total) by mouth at bedtime.  [name] glimepiride (AMARYL) 2 MG tablet 90 tablet 1    Sig: Take 0.5 tablets (1 mg total) by mouth daily before breakfast.  . Blood Glucose Monitoring Suppl (ACCU-CHEK GUIDE ME) w/Device KIT 1 kit 0    Sig: 1 each by Does not apply route once for 1 dose.    Return in about 3 months (around 07/21/2019) for Dr. Juleen China.  Karle Plumber, MD, FACP

## 2019-05-22 DIAGNOSIS — R338 Other retention of urine: Secondary | ICD-10-CM | POA: Diagnosis not present

## 2019-05-26 ENCOUNTER — Other Ambulatory Visit: Payer: Self-pay | Admitting: Family Medicine

## 2019-05-26 ENCOUNTER — Telehealth: Payer: Self-pay | Admitting: Family Medicine

## 2019-05-26 NOTE — Telephone Encounter (Signed)
1) Medication(s) Requested (by name): citalopram (CELEXA) 20 MG tablet [id number]  2) Pharmacy of Choice:  [id number] - [place], [address] [place] [address]    Approved medications will be sent to pharmacy, we will reach out to you if there is an issue.  Requests made after 3pm may not be addressed until following business day!

## 2019-05-26 NOTE — Telephone Encounter (Signed)
Patient was referred to Psych(Mojeed Darleene Cleaver, MD.)  He should contact their office for refills.  [place],  [phone]

## 2019-06-03 ENCOUNTER — Other Ambulatory Visit: Payer: Self-pay | Admitting: Family Medicine

## 2019-06-03 ENCOUNTER — Other Ambulatory Visit: Payer: Self-pay

## 2019-06-03 DIAGNOSIS — K219 Gastro-esophageal reflux disease without esophagitis: Secondary | ICD-10-CM

## 2019-06-05 ENCOUNTER — Emergency Department (HOSPITAL_COMMUNITY)
Admission: EM | Admit: 2019-06-05 | Discharge: 2019-06-05 | Disposition: A | Discharge status: Home / Self Care | Payer: Medicare Other | Attending: Emergency Medicine | Admitting: Emergency Medicine

## 2019-06-05 ENCOUNTER — Encounter (HOSPITAL_COMMUNITY): Payer: Self-pay

## 2019-06-05 ENCOUNTER — Encounter: Payer: Self-pay | Admitting: General Practice

## 2019-06-05 DIAGNOSIS — Z7982 Long term (current) use of aspirin: Secondary | ICD-10-CM | POA: Diagnosis not present

## 2019-06-05 DIAGNOSIS — R52 Pain, unspecified: Secondary | ICD-10-CM | POA: Diagnosis not present

## 2019-06-05 DIAGNOSIS — I499 Cardiac arrhythmia, unspecified: Secondary | ICD-10-CM | POA: Diagnosis not present

## 2019-06-05 DIAGNOSIS — E119 Type 2 diabetes mellitus without complications: Secondary | ICD-10-CM | POA: Insufficient documentation

## 2019-06-05 DIAGNOSIS — Z7984 Long term (current) use of oral hypoglycemic drugs: Secondary | ICD-10-CM | POA: Diagnosis not present

## 2019-06-05 DIAGNOSIS — Y732 Prosthetic and other implants, materials and accessory gastroenterology and urology devices associated with adverse incidents: Secondary | ICD-10-CM | POA: Insufficient documentation

## 2019-06-05 DIAGNOSIS — R1084 Generalized abdominal pain: Secondary | ICD-10-CM | POA: Diagnosis not present

## 2019-06-05 DIAGNOSIS — Z950 Presence of cardiac pacemaker: Secondary | ICD-10-CM | POA: Diagnosis not present

## 2019-06-05 DIAGNOSIS — R39198 Other difficulties with micturition: Secondary | ICD-10-CM | POA: Diagnosis present

## 2019-06-05 DIAGNOSIS — R531 Weakness: Secondary | ICD-10-CM | POA: Diagnosis not present

## 2019-06-05 DIAGNOSIS — Z79899 Other long term (current) drug therapy: Secondary | ICD-10-CM | POA: Insufficient documentation

## 2019-06-05 DIAGNOSIS — F29 Unspecified psychosis not due to a substance or known physiological condition: Secondary | ICD-10-CM | POA: Diagnosis not present

## 2019-06-05 DIAGNOSIS — Z7401 Bed confinement status: Secondary | ICD-10-CM | POA: Diagnosis not present

## 2019-06-05 DIAGNOSIS — M2550 Pain in unspecified joint: Secondary | ICD-10-CM | POA: Diagnosis not present

## 2019-06-05 DIAGNOSIS — I110 Hypertensive heart disease with heart failure: Secondary | ICD-10-CM | POA: Diagnosis not present

## 2019-06-05 DIAGNOSIS — J449 Chronic obstructive pulmonary disease, unspecified: Secondary | ICD-10-CM | POA: Diagnosis not present

## 2019-06-05 DIAGNOSIS — T83511A Infection and inflammatory reaction due to indwelling urethral catheter, initial encounter: Secondary | ICD-10-CM | POA: Diagnosis not present

## 2019-06-05 DIAGNOSIS — M545 Low back pain: Secondary | ICD-10-CM | POA: Diagnosis not present

## 2019-06-05 DIAGNOSIS — I5032 Chronic diastolic (congestive) heart failure: Secondary | ICD-10-CM | POA: Diagnosis not present

## 2019-06-05 DIAGNOSIS — N390 Urinary tract infection, site not specified: Secondary | ICD-10-CM | POA: Diagnosis not present

## 2019-06-05 DIAGNOSIS — F17210 Nicotine dependence, cigarettes, uncomplicated: Secondary | ICD-10-CM | POA: Insufficient documentation

## 2019-06-05 DIAGNOSIS — I959 Hypotension, unspecified: Secondary | ICD-10-CM | POA: Diagnosis not present

## 2019-06-05 LAB — URINALYSIS, ROUTINE W REFLEX MICROSCOPIC
Bilirubin Urine: NEGATIVE
Glucose, UA: NEGATIVE mg/dL
Ketones, ur: NEGATIVE mg/dL
Nitrite: NEGATIVE
Protein, ur: 30 mg/dL — AB
RBC / HPF: >50 RBC/hpf — ABNORMAL HIGH (ref 0–5)
Specific Gravity, Urine: 1.015 (ref 1.005–1.030)
WBC, UA: >50 WBC/hpf — ABNORMAL HIGH (ref 0–5)
pH: 8 (ref 5.0–8.0)

## 2019-06-05 LAB — CBC WITH DIFFERENTIAL/PLATELET
Abs Immature Granulocytes: 0.01 10*3/uL (ref 0.00–0.07)
Basophils Absolute: 0.1 10*3/uL (ref 0.0–0.1)
Basophils Relative: 1 %
Eosinophils Absolute: 0.1 10*3/uL (ref 0.0–0.5)
Eosinophils Relative: 3 %
HCT: 33.5 % — ABNORMAL LOW (ref 39.0–52.0)
Hemoglobin: 10.5 g/dL — ABNORMAL LOW (ref 13.0–17.0)
Immature Granulocytes: 0 %
Lymphocytes Relative: 12 %
Lymphs Abs: 0.5 10*3/uL — ABNORMAL LOW (ref 0.7–4.0)
MCH: 30.2 pg (ref 26.0–34.0)
MCHC: 31.3 g/dL (ref 30.0–36.0)
MCV: 96.3 fL (ref 80.0–100.0)
Monocytes Absolute: 0.6 10*3/uL (ref 0.1–1.0)
Monocytes Relative: 17 %
Neutro Abs: 2.4 10*3/uL (ref 1.7–7.7)
Neutrophils Relative %: 67 %
Platelets: 121 10*3/uL — ABNORMAL LOW (ref 150–400)
RBC: 3.48 MIL/uL — ABNORMAL LOW (ref 4.22–5.81)
RDW: 15 % (ref 11.5–15.5)
WBC: 3.7 10*3/uL — ABNORMAL LOW (ref 4.0–10.5)
nRBC: 0 % (ref 0.0–0.2)

## 2019-06-05 LAB — COMPREHENSIVE METABOLIC PANEL
ALT: 36 U/L (ref 0–44)
AST: 40 U/L (ref 15–41)
Albumin: 3 g/dL — ABNORMAL LOW (ref 3.5–5.0)
Alkaline Phosphatase: 121 U/L (ref 38–126)
Anion gap: 11 (ref 5–15)
BUN: 14 mg/dL (ref 8–23)
CO2: 22 mmol/L (ref 22–32)
Calcium: 9.2 mg/dL (ref 8.9–10.3)
Chloride: 105 mmol/L (ref 98–111)
Creatinine, Ser: 1.17 mg/dL (ref 0.61–1.24)
GFR calc Af Amer: >60 mL/min (ref >60)
GFR calc non Af Amer: 59 mL/min — ABNORMAL LOW (ref >60)
Glucose, Bld: 209 mg/dL — ABNORMAL HIGH (ref 70–99)
Potassium: 3.5 mmol/L (ref 3.5–5.1)
Sodium: 138 mmol/L (ref 135–145)
Total Bilirubin: 0.5 mg/dL (ref 0.3–1.2)
Total Protein: 7.9 g/dL (ref 6.5–8.1)

## 2019-06-05 LAB — LIPASE, BLOOD: Lipase: 34 U/L (ref 11–51)

## 2019-06-05 MED ORDER — SODIUM CHLORIDE 0.9 % IV SOLN
1.0000 g | Freq: Once | INTRAVENOUS | Status: AC
  Administered 2019-06-05: 1 g via INTRAVENOUS
  Filled 2019-06-05: qty 10

## 2019-06-05 MED ORDER — CIPROFLOXACIN HCL 500 MG PO TABS: 500.0000 mg | ORAL_TABLET | Freq: Two times a day (BID) | ORAL | 0 refills | Status: DC

## 2019-06-05 NOTE — ED Triage Notes (Addendum)
Patient arrived via [facility] from home. Lives with sister?  C/o left lower abdominal pain and lower back pain intermittent X2 months.   Foley catheter in place. Bag was emptied prior to transport to ED and had a grey tint. New urine is clear and normal.   VS: BP-125/73 p-98 RR-15 97% Ra 98.1 temp  Full Code A/Ox4 Ambulatory with cane

## 2019-06-05 NOTE — ED Notes (Addendum)
[facility] has been dispatched.   Paperwork in bin.

## 2019-06-05 NOTE — ED Provider Notes (Signed)
[facility] DEPT Provider Note   CSN: [id number] Arrival date & time: 06/05/19  0741     History Chief Complaint  Patient presents with  . Flank Pain  . Abdominal Pain  . Nausea    [name] is a 79 y.o. male.  Pt presents to the ED today with discoloration of his urine in his leg bag.  Pt has a chronic indwelling foley due to hx of urinary retention.  He said there was "black" in his urine.  EMS reported seeing "grayish" urine in the old leg bag.  Pt has had some abdominal and back pain.  No f/c.  No n/v.  Sx have been going on "awhile," but he said it was worse today.  No pain now.  Pt has been unable to get his Covid vaccine due to lack of access to transportation.  He has no current Covid sx.        Past Medical History:  Diagnosis Date  . Allergic rhinitis   . Anemia    takes Ferrous Sulfate daily  . Arthritis    "all over"  . Atrial flutter ([place])   . Balance problem 01/2014  . CAP (community acquired pneumonia) 09/18/2014  . Cervical radiculopathy due to degenerative joint disease of spine   . Chronic coronary artery disease    a. 03/2010 Nonocclusive disease by cath, performed for ST elevations on ECG;  b. 06/2013 Lexi MV: EF 60%, no ischemia.  [name] COPD (chronic obstructive pulmonary disease) ([place])   . Diabetes mellitus type II    takes Metformin and Lantus daily  . Diastolic CHF, chronic ([place])    a. 12/2012 EF 55-60%, diast dysfxn, triv MR, mildly dil LA/RA.  [name] Dyspnea    with pain  . Essential hypertension    takes Diltiazem daily  . Frail elderly   . GERD (gastroesophageal reflux disease)   . History of blood transfusion 1982   "when I had stomach OR"  . History of bronchitis    1998  . History of CVA (cerebrovascular accident)   . History of gastric ulcer   . History of kidney stones   . Hypercholesterolemia    takes Pravastatin daily  . Intrinsic eczema   . Legally blind in left eye, as defined in [place]   . Obesity    . PAF (paroxysmal atrial fibrillation) (HCC)    Recurrent after atrial flutter (a. 07/2010 Status post caval tricuspid isthmus ablation by Dr. Midge Aver Metoprolol daily), currently controlled on flecainide plus diltiazem and Coumadin  . Urinary retention   . Vitamin D deficiency   . Weakness    numbness and tingling both hands    Patient Active Problem List   Diagnosis Date Noted  . Cardiac pacemaker in situ 10/29/2018  . Major depressive disorder, recurrent severe without psychotic features ([place]) 04/15/2018  . Depression   . Atrial fibrillation with RVR ([place]) 11/08/2017  . HTN (hypertension) 05/21/2017  . Diabetes mellitus type II 05/21/2017  . Diastolic CHF, chronic ([place]) 05/21/2017  . COPD (chronic obstructive pulmonary disease) ([place]) 05/21/2017  . H/O atrial flutter 05/21/2017  . Abdominal pain 10/02/2016  . Constipation 10/02/2016  . Left lower quadrant pain   . Heme positive stool   . Symptomatic anemia 07/10/2016  . Cardiac syncope 05/15/2015  . Cervical disc disease 05/15/2015  . Anemia 05/15/2015  . History of CVA (cerebrovascular accident) 04/03/2014  . Benign neoplasm of rectum and anal canal 12/02/2013  . Upper GI bleeding  11/30/2013  . Tobacco use disorder 11/30/2013  . Type 2 diabetes mellitus ([place]) 01/22/2013  . Hyperlipidemia   . Obesity (BMI 30-39.9) 12/14/2012  . Persistent atrial fibrillation ([place]) 10/07/2010  . Atrial flutter - status post CTI ablation 07/29/2010  . Essential hypertension 07/29/2010  . Chronic diastolic congestive heart failure ([place]) 07/29/2010  . Atherosclerotic heart disease of native coronary artery with other forms of angina pectoris ([place]) 07/29/2010    Past Surgical History:  Procedure Laterality Date  . ATRIAL ABLATION SURGERY  08/05/10   CTI ablation for atrial flutter by JA  . CARDIAC CATHETERIZATION  2012   nl LV function, no occlusive CAD, PAF  . CARDIOVERSION  12/07/2010    Successful direct current cardioversion  with atrial fibrillation to normal sinus rhythm  . CARPAL TUNNEL RELEASE Bilateral 01/30/2014   Procedure: BILATERAL CARPAL TUNNEL RELEASE;  Surgeon: Marianna Payment, MD;  Location: [facility];  Service: Orthopedics;  Laterality: Bilateral;  . CATARACT EXTRACTION W/ INTRAOCULAR LENS  IMPLANT, BILATERAL Bilateral   . COLONOSCOPY [name] 12/02/2013   Procedure: COLONOSCOPY;  Surgeon: Irene Shipper, MD;  Location: [facility];  Service: Endoscopy;  Laterality: [name];  . ESOPHAGOGASTRODUODENOSCOPY [name] 09/22/2014   Procedure: ESOPHAGOGASTRODUODENOSCOPY (EGD);  Surgeon: Ronald Lobo, MD;  Location: [facility] ENDOSCOPY;  Service: Endoscopy;  Laterality: [name];  . ESOPHAGOGASTRODUODENOSCOPY [name] 07/11/2016   Procedure: ESOPHAGOGASTRODUODENOSCOPY (EGD);  Surgeon: Doran Stabler, MD;  Location[facility] END[facility]COPY;  Service: Endoscopy;  Laterality: [name];  . EXTRACORPOREAL SHOCK WAVE LITHOTRIPSY Right 07/15/2018   Procedure: EXTRACORPOREAL SHOCK WAVE LITHOTRIPSY (ESWL);  Surgeon: Lucas Mallow, MD;  Location: [facility];  Service: Urology;  Laterality: Right;  . INCISION AND DRAINAGE ABSCESS / HEMATOMA OF BURSA / KNEE / THIGH Left 1998   knee  . KNEE BURSECTOMY Left 1998  . LAPAROSCOPIC CHOLECYSTECTOMY  03/2010  . LEFT HEART CATH AND CORONARY ANGIOGRAPHY [name] 10/05/2017   Procedure: LEFT HEART CATH AND CORONARY ANGIOGRAPHY;  Surgeon: Martinique, Peter M, MD;  Location: [facility] CV LAB;  Service: Cardiovascular;  Laterality: [name];  . NM MYOVIEW LTD  07/22/2013   Normal EF ~60%, no ischemia or infarction.  [name] PACEMAKER IMPLANT [name] 10/21/2018   Procedure: PACEMAKER IMPLANT;  Surgeon: Deboraha Sprang, MD;  Location: [facility] CV LAB;  Service: Cardiovascular;  Laterality: [name];  . PARTIAL GASTRECTOMY  1982   subtotal; "took out 30% for ulcers"  . TRANSTHORACIC ECHOCARDIOGRAM  02/16/2014   EF 60%, no RWMA. - otherwise normal  . YAG LASER APPLICATION Bilateral        Family History  Problem Relation Age of Onset  . Breast cancer  Mother   . Diabetes Mother   . Kidney disease Mother   . Heart attack Father   . Heart disease Father   . Heart attack Brother   . Leukemia Daughter     Social History   Tobacco Use  . Smoking status: Current Every Day Smoker    Packs/day: 0.25    Years: 57.00    Pack years: 14.25    Types: Cigarettes  . Smokeless tobacco: Never Used  . Tobacco comment: He's been smoking between 0.5 and 2 ppd since age 45. QUIT 3 MONTHS AGO  Substance Use Topics  . Alcohol use: No    Alcohol/week: 0.0 standard drinks    Comment: "quit drinking in 1986"  . Drug use: No    Home Medications Prior to Admission medications   Medication Sig Start Date End Date Taking?  Authorizing Provider  Accu-Chek FastClix Lancets MISC USE TO TEST BLOOD SUGAR TWICE DAILY. E11.00 12/04/18   Charlott Rakes, MD  aspirin EC 81 MG tablet Take 81 mg by mouth daily.    [provider]  ciprofloxacin (CIPRO) 500 MG tablet Take 1 tablet (500 mg total) by mouth 2 (two) times daily. 06/05/19   Isla Pence, MD  citalopram (CELEXA) 20 MG tablet Take 1 tablet (20 mg total) by mouth daily. 10/02/18   Scot Jun, FNP  diclofenac sodium (VOLTAREN) 1 % GEL APPLY 2 GRAMS TOPICALLY 2 TIMES DAILY. 11/25/18   [provider]  diclofenac Sodium (VOLTAREN) 1 % GEL Apply 2 g topically 4 (four) times daily. 02/18/19   Ladell Pier, MD  diltiazem (CARDIZEM CD) 120 MG 24 hr capsule Take 1 capsule (120 mg total) by mouth 2 (two) times daily. 04/07/19   Camnitz, Ocie Doyne, MD  diltiazem (CARDIZEM CD) 360 MG 24 hr capsule Take 360 mg by mouth daily. 05/01/19   [provider]  ferrous sulfate 325 (65 FE) MG tablet Take 325 mg by mouth daily with breakfast.    [provider]  gabapentin (NEURONTIN) 100 MG capsule Take 1 capsule (100 mg total) by mouth at bedtime. 04/22/19   Ladell Pier, MD  glimepiride (AMARYL) 2 MG tablet Take 0.5 tablets (1 mg total) by mouth daily before breakfast. 04/22/19    Ladell Pier, MD  glucose blood (ACCU-CHEK GUIDE) test strip USE TO TEST BLOOD SUGAR TWICE DAILY. E11.00 12/19/18   Charlott Rakes, MD  nitroGLYCERIN (NITROSTAT) 0.4 MG SL tablet Place 1 tablet (0.4 mg total) under the tongue every 5 (five) minutes x 3 doses as needed for chest pain. 11/19/15   Erlene Quan, PA-C  pantoprazole (PROTONIX) 40 MG tablet Take 1 tablet (40 mg total) by mouth 2 (two) times daily before a meal. Dx: K21.9 06/03/19   Nicolette Bang, DO  pravastatin (PRAVACHOL) 40 MG tablet Take 1 tablet (40 mg total) by mouth every evening. 10/02/18   Scot Jun, FNP  tamsulosin (FLOMAX) 0.4 MG CAPS capsule Take 1 capsule (0.4 mg total) by mouth daily. 10/02/18   Scot Jun, FNP    Allergies    Cyclobenzaprine and Other  Review of Systems   Review of Systems  Genitourinary:       Urine discoloration  All other systems reviewed and are negative.   Physical Exam Updated Vital Signs BP (!) 89/72 (BP Location: Right Arm)   Pulse 81   Temp 97.7 F (36.5 C) (Oral)   Resp 16   SpO2 100%   Physical Exam Vitals and nursing note reviewed.  Constitutional:      Appearance: He is well-developed.  HENT:     Head: Normocephalic and atraumatic.     Mouth/Throat:     Mouth: Mucous membranes are moist.     Pharynx: Oropharynx is clear.  Eyes:     Extraocular Movements: Extraocular movements intact.     Pupils: Pupils are equal, round, and reactive to light.  Cardiovascular:     Rate and Rhythm: Normal rate and regular rhythm.  Abdominal:     General: Abdomen is flat. Bowel sounds are increased.     Palpations: Abdomen is soft.     Tenderness: There is abdominal tenderness in the suprapubic area.  Skin:    General: Skin is warm.     Capillary Refill: Capillary refill takes less than 2 seconds.  Neurological:  General: No focal deficit present.     Mental Status: He is alert and oriented to person, place, and time.  Psychiatric:        Mood  and Affect: Mood normal.        Behavior: Behavior normal.     ED Results / Procedures / Treatments   Labs (all labs ordered are listed, but only abnormal results are displayed) Labs Reviewed  URINALYSIS, ROUTINE W REFLEX MICROSCOPIC - Abnormal; Notable for the following components:      Result Value   APPearance HAZY (*)    Hgb urine dipstick MODERATE (*)    Protein, ur 30 (*)    Leukocytes,Ua SMALL (*)    RBC / HPF >50 (*)    WBC, UA >50 (*)    Bacteria, UA MANY (*)    All other components within normal limits  CBC WITH DIFFERENTIAL/PLATELET - Abnormal; Notable for the following components:   WBC 3.7 (*)    RBC 3.48 (*)    Hemoglobin 10.5 (*)    HCT 33.5 (*)    Platelets 121 (*)    Lymphs Abs 0.5 (*)    All other components within normal limits  COMPREHENSIVE METABOLIC PANEL - Abnormal; Notable for the following components:   Glucose, Bld 209 (*)    Albumin 3.0 (*)    GFR calc non Af Amer 59 (*)    All other components within normal limits  URINE CULTURE  LIPASE, BLOOD    EKG None  Radiology No results found.  Procedures Procedures (including critical care time)  Medications Ordered in ED Medications  cefTRIAXone (ROCEPHIN) 1 g in sodium chloride 0.9 % 100 mL IVPB (has no administration in time range)    ED Course  I have reviewed the triage vital signs and the nursing notes.  Pertinent labs & imaging results that were available during my care of the patient were reviewed by me and considered in my medical decision making (see chart for details).    MDM Rules/Calculators/A&P                      Pt has no complaints of pain now.  He does have a UTI and an indwelling foley.  His last urine culture that grew out a single organism was in July of 2020.  This grew out pseudomonas which was sensitive to cipro.  I will give him a dose of rocephin in ED and d/c home with cipro.  Urine sent for cx.  Pt's labs are stable.  Pt told to f/u with urology.  Return if  worse.  Final Clinical Impression(s) / ED Diagnoses Final diagnoses:  Urinary tract infection associated with indwelling urethral catheter, initial encounter ([place])    Rx / DC Orders ED Discharge Orders         Ordered    ciprofloxacin (CIPRO) 500 MG tablet  2 times daily     06/05/19 [id number]           Isla Pence, MD 06/05/19 [phone]

## 2019-06-07 LAB — URINE CULTURE: Culture: >=100000 — AB

## 2019-06-08 ENCOUNTER — Telehealth: Payer: Self-pay | Admitting: Emergency Medicine

## 2019-06-08 NOTE — Telephone Encounter (Signed)
Post ED Visit - Positive Culture Follow-up  Culture report reviewed by antimicrobial stewardship pharmacist: [facility] Team []  Elenor Quinones, Pharm.D. []  Heide Guile, Pharm.D., BCPS AQ-ID []  Parks Neptune, Pharm.D., BCPS []  Alycia Rossetti, Pharm.D., BCPS []  [place], Pharm.D., BCPS, AAHIVP []  Legrand Como, Pharm.D., BCPS, AAHIVP []  Salome Arnt, PharmD, BCPS []  Johnnette Gourd, PharmD, BCPS []  Hughes Better, PharmD, BCPS []  Leeroy Cha, PharmD []  Laqueta Linden, PharmD, BCPS []  Albertina Parr, PharmD  [facility] Team []  Leodis Sias, PharmD []  Lindell Spar, PharmD []  Royetta Asal, PharmD [x]  Graylin Shiver, Rph []  Rema Fendt) Glennon Mac, PharmD []  Arlyn Dunning, PharmD []  Netta Cedars, PharmD []  Dia Sitter, PharmD []  Leone Haven, PharmD []  Gretta Arab, PharmD []  Theodis Shove, PharmD []  Peggyann Juba, PharmD []  Reuel Boom, PharmD   Positive urine culture Treated with Ciprofloxacin, organism sensitive to the same and no further patient follow-up is required at this time.  Sandi Raveling Marke Goodwyn 06/08/2019, 2:55 PM

## 2019-06-14 IMAGING — CR DG CHEST 2V
2 series · 2 of 2 positions shown · non-contrast
Comparison: 01/18/2018

CLINICAL DATA: Atrial fibrillation, fever

EXAM:
CHEST - 2 VIEW

[chest pa]
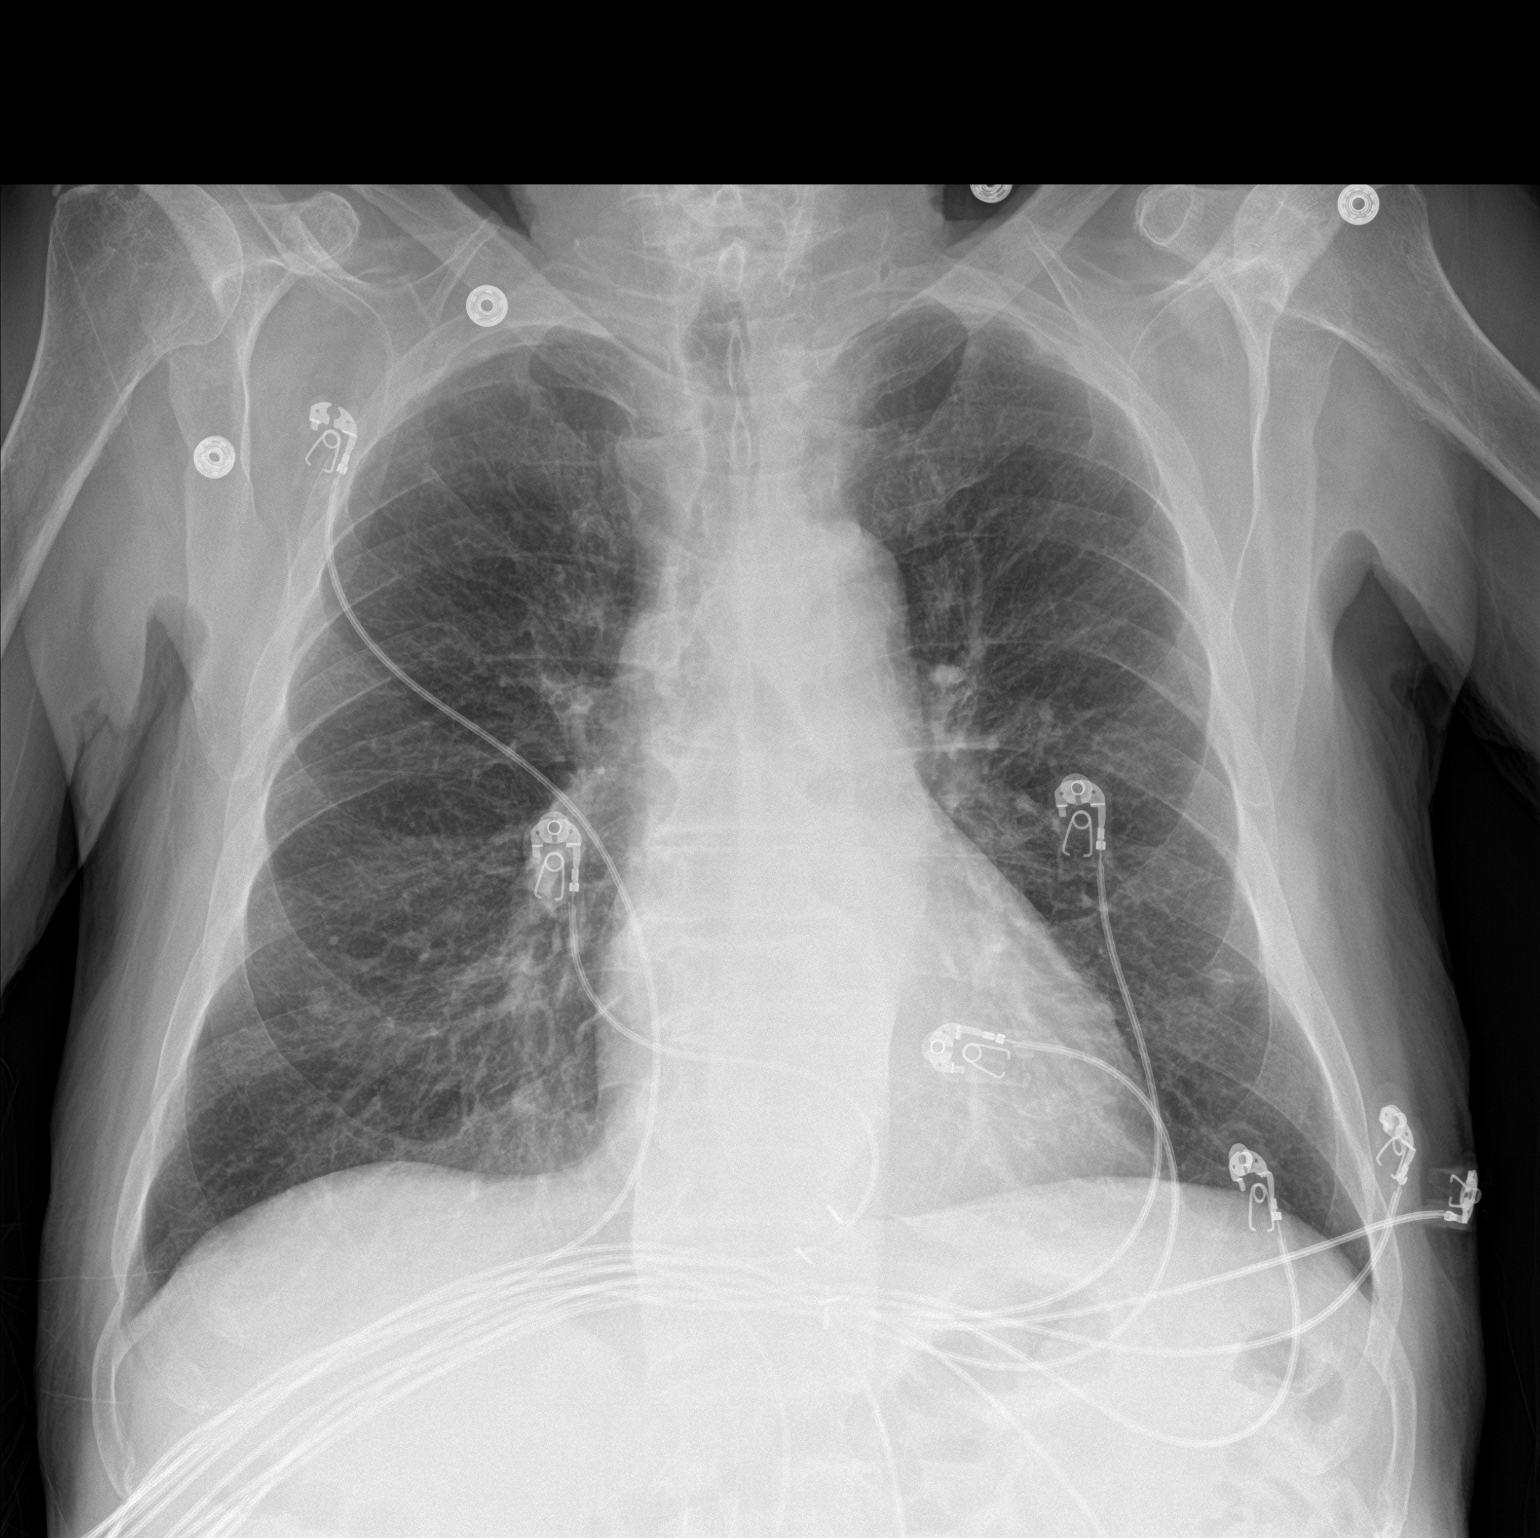

[chest lat]
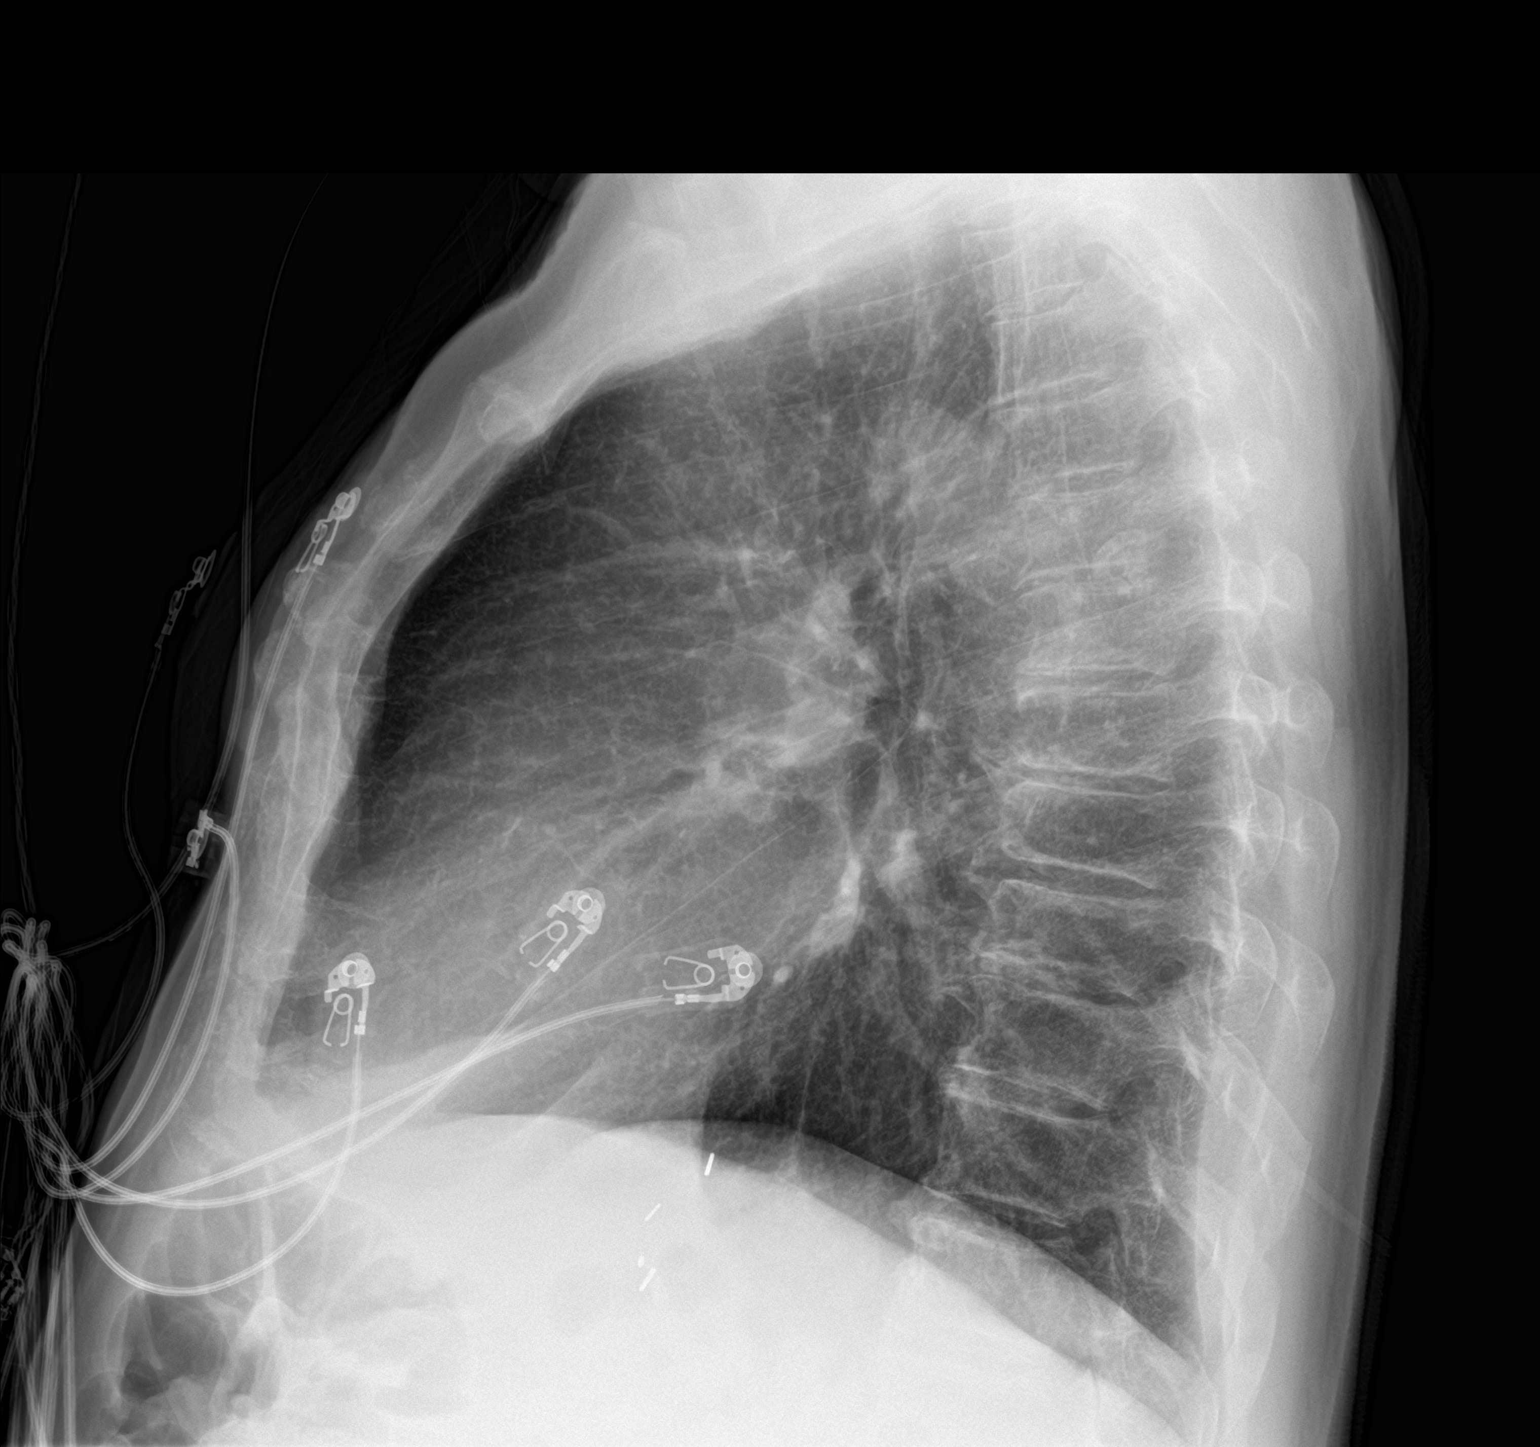

[2 of 2 positions shown; findings below may reference images not displayed]

FINDINGS: Heart and mediastinal contours are within normal limits. No focal
opacities or effusions. No acute bony abnormality.
IMPRESSION: No active cardiopulmonary disease.

## 2019-06-15 ENCOUNTER — Emergency Department (HOSPITAL_COMMUNITY)
Admission: EM | Admit: 2019-06-15 | Discharge: 2019-06-15 | Disposition: A | Discharge status: Home / Self Care | Payer: Medicare Other | Attending: Emergency Medicine | Admitting: Emergency Medicine

## 2019-06-15 ENCOUNTER — Other Ambulatory Visit: Payer: Self-pay

## 2019-06-15 DIAGNOSIS — I110 Hypertensive heart disease with heart failure: Secondary | ICD-10-CM | POA: Insufficient documentation

## 2019-06-15 DIAGNOSIS — I5032 Chronic diastolic (congestive) heart failure: Secondary | ICD-10-CM | POA: Insufficient documentation

## 2019-06-15 DIAGNOSIS — J449 Chronic obstructive pulmonary disease, unspecified: Secondary | ICD-10-CM | POA: Insufficient documentation

## 2019-06-15 DIAGNOSIS — R300 Dysuria: Secondary | ICD-10-CM | POA: Diagnosis not present

## 2019-06-15 DIAGNOSIS — N398 Other specified disorders of urinary system: Secondary | ICD-10-CM | POA: Diagnosis not present

## 2019-06-15 DIAGNOSIS — N390 Urinary tract infection, site not specified: Secondary | ICD-10-CM | POA: Insufficient documentation

## 2019-06-15 DIAGNOSIS — Z79899 Other long term (current) drug therapy: Secondary | ICD-10-CM | POA: Insufficient documentation

## 2019-06-15 DIAGNOSIS — Y732 Prosthetic and other implants, materials and accessory gastroenterology and urology devices associated with adverse incidents: Secondary | ICD-10-CM | POA: Insufficient documentation

## 2019-06-15 DIAGNOSIS — Z7984 Long term (current) use of oral hypoglycemic drugs: Secondary | ICD-10-CM | POA: Diagnosis not present

## 2019-06-15 DIAGNOSIS — R5381 Other malaise: Secondary | ICD-10-CM | POA: Diagnosis not present

## 2019-06-15 DIAGNOSIS — T83198A Other mechanical complication of other urinary devices and implants, initial encounter: Secondary | ICD-10-CM | POA: Diagnosis not present

## 2019-06-15 DIAGNOSIS — Z7982 Long term (current) use of aspirin: Secondary | ICD-10-CM | POA: Insufficient documentation

## 2019-06-15 DIAGNOSIS — T83518A Infection and inflammatory reaction due to other urinary catheter, initial encounter: Secondary | ICD-10-CM | POA: Diagnosis not present

## 2019-06-15 DIAGNOSIS — E119 Type 2 diabetes mellitus without complications: Secondary | ICD-10-CM | POA: Insufficient documentation

## 2019-06-15 LAB — BASIC METABOLIC PANEL
Anion gap: 10 (ref 5–15)
BUN: 18 mg/dL (ref 8–23)
CO2: 21 mmol/L — ABNORMAL LOW (ref 22–32)
Calcium: 8.8 mg/dL — ABNORMAL LOW (ref 8.9–10.3)
Chloride: 108 mmol/L (ref 98–111)
Creatinine, Ser: 1.34 mg/dL — ABNORMAL HIGH (ref 0.61–1.24)
GFR calc Af Amer: 58 mL/min — ABNORMAL LOW (ref >60)
GFR calc non Af Amer: 50 mL/min — ABNORMAL LOW (ref >60)
Glucose, Bld: 190 mg/dL — ABNORMAL HIGH (ref 70–99)
Potassium: 4 mmol/L (ref 3.5–5.1)
Sodium: 139 mmol/L (ref 135–145)

## 2019-06-15 LAB — CBC WITH DIFFERENTIAL/PLATELET
Abs Immature Granulocytes: 0.01 10*3/uL (ref 0.00–0.07)
Basophils Absolute: 0.1 10*3/uL (ref 0.0–0.1)
Basophils Relative: 2 %
Eosinophils Absolute: 0.2 10*3/uL (ref 0.0–0.5)
Eosinophils Relative: 5 %
HCT: 32.4 % — ABNORMAL LOW (ref 39.0–52.0)
Hemoglobin: 10.1 g/dL — ABNORMAL LOW (ref 13.0–17.0)
Immature Granulocytes: 0 %
Lymphocytes Relative: 14 %
Lymphs Abs: 0.5 10*3/uL — ABNORMAL LOW (ref 0.7–4.0)
MCH: 30.4 pg (ref 26.0–34.0)
MCHC: 31.2 g/dL (ref 30.0–36.0)
MCV: 97.6 fL (ref 80.0–100.0)
Monocytes Absolute: 0.6 10*3/uL (ref 0.1–1.0)
Monocytes Relative: 17 %
Neutro Abs: 2.3 10*3/uL (ref 1.7–7.7)
Neutrophils Relative %: 62 %
Platelets: 114 10*3/uL — ABNORMAL LOW (ref 150–400)
RBC: 3.32 MIL/uL — ABNORMAL LOW (ref 4.22–5.81)
RDW: 14.9 % (ref 11.5–15.5)
WBC: 3.7 10*3/uL — ABNORMAL LOW (ref 4.0–10.5)
nRBC: 0 % (ref 0.0–0.2)

## 2019-06-15 LAB — URINALYSIS, ROUTINE W REFLEX MICROSCOPIC
Bilirubin Urine: NEGATIVE
Glucose, UA: NEGATIVE mg/dL
Hgb urine dipstick: NEGATIVE
Ketones, ur: NEGATIVE mg/dL
Leukocytes,Ua: NEGATIVE
Nitrite: NEGATIVE
Protein, ur: NEGATIVE mg/dL
Specific Gravity, Urine: >1.03 — ABNORMAL HIGH (ref 1.005–1.030)
pH: 5.5 (ref 5.0–8.0)

## 2019-06-15 NOTE — ED Provider Notes (Signed)
[facility] DEPT Provider Note   CSN: [id number] Arrival date & time: 06/15/19  [id number]     History Chief Complaint  Patient presents with  . Vascular Access Problem  . urinary issues    [name] is a 79 y.o. male.  Patient with history of diabetes, urinary retention with indwelling Foley catheter --presents to the emergency department with complaint of suprapubic pain, back pain, discoloration of his urine in the Foley catheter.  Patient was seen on 06/05/2019 with similar symptoms.  He was treated for UTI at that time.  His urinary culture grew out greater than 100,000 colonies of Klebsiella sensitive to Cipro.  He completed course of Cipro but his symptoms have now returned prompting ED visit today.  Patient denies fevers, chills, vomiting or diarrhea.  Transported by EMS.        Past Medical History:  Diagnosis Date  . Allergic rhinitis   . Anemia    takes Ferrous Sulfate daily  . Arthritis    "all over"  . Atrial flutter ([place])   . Balance problem 01/2014  . CAP (community acquired pneumonia) 09/18/2014  . Cervical radiculopathy due to degenerative joint disease of spine   . Chronic coronary artery disease    a. 03/2010 Nonocclusive disease by cath, performed for ST elevations on ECG;  b. 06/2013 Lexi MV: EF 60%, no ischemia.  [name] COPD (chronic obstructive pulmonary disease) ([place])   . Diabetes mellitus type II    takes Metformin and Lantus daily  . Diastolic CHF, chronic ([place])    a. 12/2012 EF 55-60%, diast dysfxn, triv MR, mildly dil LA/RA.  [name] Dyspnea    with pain  . Essential hypertension    takes Diltiazem daily  . Frail elderly   . GERD (gastroesophageal reflux disease)   . History of blood transfusion 1982   "when I had stomach OR"  . History of bronchitis    1998  . History of CVA (cerebrovascular accident)   . History of gastric ulcer   . History of kidney stones   . Hypercholesterolemia    takes Pravastatin daily  .  Intrinsic eczema   . Legally blind in left eye, as defined in [place]   . Obesity   . PAF (paroxysmal atrial fibrillation) (HCC)    Recurrent after atrial flutter (a. 07/2010 Status post caval tricuspid isthmus ablation by Dr. Midge Aver Metoprolol daily), currently controlled on flecainide plus diltiazem and Coumadin  . Urinary retention   . Vitamin D deficiency   . Weakness    numbness and tingling both hands    Patient Active Problem List   Diagnosis Date Noted  . Cardiac pacemaker in situ 10/29/2018  . Major depressive disorder, recurrent severe without psychotic features ([place]) 04/15/2018  . Depression   . Atrial fibrillation with RVR ([place]) 11/08/2017  . HTN (hypertension) 05/21/2017  . Diabetes mellitus type II 05/21/2017  . Diastolic CHF, chronic ([place]) 05/21/2017  . COPD (chronic obstructive pulmonary disease) ([place]) 05/21/2017  . H/O atrial flutter 05/21/2017  . Abdominal pain 10/02/2016  . Constipation 10/02/2016  . Left lower quadrant pain   . Heme positive stool   . Symptomatic anemia 07/10/2016  . Cardiac syncope 05/15/2015  . Cervical disc disease 05/15/2015  . Anemia 05/15/2015  . History of CVA (cerebrovascular accident) 04/03/2014  . Benign neoplasm of rectum and anal canal 12/02/2013  . Upper GI bleeding 11/30/2013  . Tobacco use disorder 11/30/2013  . Type 2 diabetes mellitus ([place]) 01/22/2013  .  Hyperlipidemia   . Obesity (BMI 30-39.9) 12/14/2012  . Persistent atrial fibrillation ([place]) 10/07/2010  . Atrial flutter - status post CTI ablation 07/29/2010  . Essential hypertension 07/29/2010  . Chronic diastolic congestive heart failure ([place]) 07/29/2010  . Atherosclerotic heart disease of native coronary artery with other forms of angina pectoris ([place]) 07/29/2010    Past Surgical History:  Procedure Laterality Date  . ATRIAL ABLATION SURGERY  08/05/10   CTI ablation for atrial flutter by [facility]  . CARDIAC CATHETERIZATION  2012   nl LV function, no occlusive CAD,  PAF  . CARDIOVERSION  12/07/2010    Successful direct current cardioversion with atrial fibrillation to normal sinus rhythm  . CARPAL TUNNEL RELEASE Bilateral 01/30/2014   Procedure: BILATERAL CARPAL TUNNEL RELEASE;  Surgeon: Marianna Payment, MD;  Location: [facility];  Service: Orthopedics;  Laterality: Bilateral;  . CATARACT EXTRACTION W/ INTRAOCULAR LENS  IMPLANT, BILATERAL Bilateral   . COLONOSCOPY [name] 12/02/2013   Procedure: COLONOSCOPY;  Surgeon: Irene Shipper, MD;  Location: [facility];  Service: Endoscopy;  Laterality: [name];  . ESOPHAGOGASTRODUODENOSCOPY [name] 09/22/2014   Procedure: ESOPHAGOGASTRODUODENOSCOPY (EGD);  Surgeon: Ronald Lobo, MD;  Location: [facility] ENDOSCOPY;  Service: Endoscopy;  Laterality: [name];  . ESOPHAGOGASTRODUODENOSCOPY [name] 07/11/2016   Procedure: ESOPHAGOGASTRODUODENOSCOPY (EGD);  Surgeon: Doran Stabler, MD;  Location: [facility] ENDOSCOPY;  Service: Endoscopy;  Laterality: [name];  . EXTRACORPOREAL SHOCK WAVE LITHOTRIPSY Right 07/15/2018   Procedure: EXTRACORPOREAL SHOCK WAVE LITHOTRIPSY (ESWL);  Surgeon: Lucas Mallow, MD;  Location: [facility];  Service: Urology;  Laterality: Right;  . INCISION AND DRAINAGE ABSCESS / HEMATOMA OF BURSA / KNEE / THIGH Left 1998   knee  . KNEE BURSECTOMY Left 1998  . LAPAROSCOPIC CHOLECYSTECTOMY  03/2010  . LEFT HEART CATH AND CORONARY ANGIOGRAPHY [name] 10/05/2017   Procedure: LEFT HEART CATH AND CORONARY ANGIOGRAPHY;  Surgeon: Martinique, Peter M, MD;  Location: [facility] CV LAB;  Service: Cardiovascular;  Laterality: [name];  . NM MYOVIEW LTD  07/22/2013   Normal EF ~60%, no ischemia or infarction.  [name] PACEMAKER IMPLANT [name] 10/21/2018   Procedure: PACEMAKER IMPLANT;  Surgeon: Deboraha Sprang, MD;  Location: [facility] CV LAB;  Service: Cardiovascular;  Laterality: [name];  . PARTIAL GASTRECTOMY  1982   subtotal; "took out 30% for ulcers"  . TRANSTHORACIC ECHOCARDIOGRAM  02/16/2014   EF 60%, no RWMA. - otherwise normal  . YAG LASER APPLICATION Bilateral          Family History  Problem Relation Age of Onset  . Breast cancer Mother   . Diabetes Mother   . Kidney disease Mother   . Heart attack Father   . Heart disease Father   . Heart attack Brother   . Leukemia Daughter     Social History   Tobacco Use  . Smoking status: Current Every Day Smoker    Packs/day: 0.25    Years: 57.00    Pack years: 14.25    Types: Cigarettes  . Smokeless tobacco: Never Used  . Tobacco comment: He's been smoking between 0.5 and 2 ppd since age 29. QUIT 3 MONTHS AGO  Substance Use Topics  . Alcohol use: No    Alcohol/week: 0.0 standard drinks    Comment: "quit drinking in 1986"  . Drug use: No    Home Medications Prior to Admission medications   Medication Sig Start Date End Date Taking? Authorizing Provider  Accu-Chek FastClix Lancets MISC USE TO TEST BLOOD SUGAR TWICE DAILY. E11.00 12/04/18  Charlott Rakes, MD  acetaminophen (TYLENOL) 650 MG CR tablet Take 650 mg by mouth every 8 (eight) hours as needed for pain.    [provider]  aspirin EC 81 MG tablet Take 81 mg by mouth daily.    [provider]  ciprofloxacin (CIPRO) 500 MG tablet Take 1 tablet (500 mg total) by mouth 2 (two) times daily. 06/05/19   Isla Pence, MD  citalopram (CELEXA) 20 MG tablet Take 1 tablet (20 mg total) by mouth daily. 10/02/18   Scot Jun, FNP  diclofenac sodium (VOLTAREN) 1 % GEL Apply 2 g topically 2 (two) times daily.  11/25/18   [provider]  diclofenac Sodium (VOLTAREN) 1 % GEL Apply 2 g topically 4 (four) times daily. Patient not taking: Reported on 06/05/2019 02/18/19   Ladell Pier, MD  diltiazem (CARDIZEM CD) 120 MG 24 hr capsule Take 1 capsule (120 mg total) by mouth 2 (two) times daily. 04/07/19   Camnitz, Ocie Doyne, MD  ferrous sulfate 325 (65 FE) MG tablet Take 325 mg by mouth daily with breakfast.    [provider]  gabapentin (NEURONTIN) 100 MG capsule Take 1 capsule (100 mg total) by mouth at  bedtime. 04/22/19   Ladell Pier, MD  glimepiride (AMARYL) 2 MG tablet Take 0.5 tablets (1 mg total) by mouth daily before breakfast. Patient taking differently: Take 2 mg by mouth daily before breakfast.  04/22/19   Ladell Pier, MD  glucose blood (ACCU-CHEK GUIDE) test strip USE TO TEST BLOOD SUGAR TWICE DAILY. E11.00 12/19/18   Charlott Rakes, MD  nitroGLYCERIN (NITROSTAT) 0.4 MG SL tablet Place 1 tablet (0.4 mg total) under the tongue every 5 (five) minutes x 3 doses as needed for chest pain. 11/19/15   Erlene Quan, PA-C  pantoprazole (PROTONIX) 40 MG tablet Take 1 tablet (40 mg total) by mouth 2 (two) times daily before a meal. Dx: K21.9 06/03/19   Nicolette Bang, DO  pravastatin (PRAVACHOL) 40 MG tablet Take 1 tablet (40 mg total) by mouth every evening. 10/02/18   Scot Jun, FNP  tamsulosin (FLOMAX) 0.4 MG CAPS capsule Take 1 capsule (0.4 mg total) by mouth daily. 10/02/18   Scot Jun, FNP    Allergies    Cyclobenzaprine and Other  Review of Systems   Review of Systems  Constitutional: Negative for fever.  HENT: Negative for rhinorrhea and sore throat.   Eyes: Negative for redness.  Respiratory: Negative for cough.   Cardiovascular: Negative for chest pain.  Gastrointestinal: Positive for abdominal pain. Negative for diarrhea, nausea and vomiting.  Genitourinary: Negative for decreased urine volume, discharge, dysuria and penile pain.  Musculoskeletal: Positive for back pain. Negative for myalgias.  Skin: Negative for rash.  Neurological: Negative for headaches.    Physical Exam Updated Vital Signs BP 106/61 (BP Location: Right Arm)   Pulse 60   Temp 98 F (36.7 C) (Oral)   Resp 18   Ht 5\' 6"  (1.676 m)   Wt 79.4 kg   SpO2 99%   BMI 28.25 kg/m   Physical Exam Vitals and nursing note reviewed.  Constitutional:      Appearance: He is well-developed.  HENT:     Head: Normocephalic and atraumatic.  Eyes:     General:        Right  eye: No discharge.        Left eye: No discharge.     Conjunctiva/sclera: Conjunctivae normal.  Cardiovascular:  Rate and Rhythm: Normal rate and regular rhythm.     Heart sounds: Normal heart sounds.  Pulmonary:     Effort: Pulmonary effort is normal.     Breath sounds: Normal breath sounds.  Abdominal:     Palpations: Abdomen is soft.     Tenderness: There is abdominal tenderness. There is no right CVA tenderness, left CVA tenderness, guarding or rebound.     Comments: Mild suprapubic pain.  Genitourinary:    Comments: Patient with indwelling Foley catheter in place.  Urine in current bag appears yellow, slightly cloudy.  Patient states that he changed the bag 3 days ago.  There is a faint lavender discoloration of foley bag.  Musculoskeletal:     Cervical back: Normal range of motion and neck supple.  Skin:    General: Skin is warm and dry.  Neurological:     Mental Status: He is alert.     ED Results / Procedures / Treatments   Labs (all labs ordered are listed, but only abnormal results are displayed) Labs Reviewed  URINALYSIS, ROUTINE W REFLEX MICROSCOPIC - Abnormal; Notable for the following components:      Result Value   Specific Gravity, Urine >1.030 (*)    All other components within normal limits  CBC WITH DIFFERENTIAL/PLATELET - Abnormal; Notable for the following components:   WBC 3.7 (*)    RBC 3.32 (*)    Hemoglobin 10.1 (*)    HCT 32.4 (*)    Platelets 114 (*)    Lymphs Abs 0.5 (*)    All other components within normal limits  BASIC METABOLIC PANEL - Abnormal; Notable for the following components:   CO2 21 (*)    Glucose, Bld 190 (*)    Creatinine, Ser 1.34 (*)    Calcium 8.8 (*)    GFR calc non Af Amer 50 (*)    GFR calc Af Amer 58 (*)    All other components within normal limits  URINE CULTURE    EKG None  Radiology No results found.  Procedures Procedures (including critical care time)  Medications Ordered in ED Medications - No data  to display  ED Course  I have reviewed the triage vital signs and the nursing notes.  Pertinent labs & imaging results that were available during my care of the patient were reviewed by me and considered in my medical decision making (see chart for details).  Patient seen and examined. Work-up initiated. Reviewed the previous visit and culture results.  Patient appears comfortable.  Vital signs reviewed and are as follows: BP (!) 134/97 (BP Location: Right Arm)   Pulse 65   Temp 98 F (36.7 C) (Oral)   Resp 16   Ht 5\' 6"  (1.676 m)   Wt 79.4 kg   SpO2 99%   BMI 28.25 kg/m   Work-up today demonstrates improved UA.  Culture pending.  WBC 3.7.  Hemoglobin of 10.1 at baseline.  Glucose is 190 with creatinine of 1.34.  Patient discussed with and seen by Dr. Zenia Resides.  At this point suspect that UTI symptoms should be improving.  We will continue management symptomatically.  No indication for further work-up at this time.    MDM Rules/Calculators/A&P                      Patient presents with continued dysuria and concern that his urine is discolored.  Recent Klebsiella UTI, treated with Cipro which was sensitive.  Patient with normal appearing  UA today.  CBC and BMP are reassuring.  Patient does not have any clinical signs of pyelonephritis including fevers, severe flank pain, vomiting.  He is appears well.  No significant discoloration of urine or Foley bag today.  Overall suspect that symptoms are improving.  Culture pending.  Mild suprapubic tenderness on exam.  GU exam is normal with indwelling Foley catheter.  Patient encouraged to follow-up with PCP as needed.     Final Clinical Impression(s) / ED Diagnoses Final diagnoses:  Dysuria  Purple urine bag syndrome [facility])    Rx / DC Orders ED Discharge Orders    None       [place] Cater, Hershal Coria 06/15/19 1253    Lacretia Leigh, MD 06/15/19 1348

## 2019-06-15 NOTE — Discharge Instructions (Signed)
Please read and follow all provided instructions.  Your diagnoses today include:  1. Dysuria   2. Purple urine bag syndrome (HCC)     Tests performed today include:  Blood counts and electrolytes  Urine test -it appears that your urinary tract infection is improving, we sent a culture but you do not need additional antibiotics today  Vital signs. See below for your results today.   Medications prescribed:   None  Take any prescribed medications only as directed.  Home care instructions:  Follow any educational materials contained in this packet.  Follow-up instructions: Please follow-up with your primary care provider in the next 3 days for further evaluation of your symptoms and follow-up of your urine culture.   Return instructions:   Please return to the Emergency Department if you experience worsening symptoms.   Return if you develop a fever, vomiting, severe pain in your abdomen or back.  Please return if you have any other emergent concerns.  Additional Information:  Your vital signs today were: BP (!) 103/50 (BP Location: Right Arm)    Pulse 64    Temp 98 F (36.7 C) (Oral)    Resp 15    Ht 5\' 6"  (1.676 m)    Wt 79.4 kg    SpO2 100%    BMI 28.25 kg/m  If your blood pressure (BP) was elevated above 135/85 this visit, please have this repeated by your doctor within one month. --------------

## 2019-06-15 NOTE — ED Provider Notes (Signed)
Medical screening examination/treatment/procedure(s) were conducted as a shared visit with non-physician practitioner(s) and myself.  I personally evaluated the patient during the encounter.    79 year old male who presents due to change in his urine.  Urinalysis here was negative for infection.  Remaining work-up is reassuring and patient be discharged home   Lacretia Leigh, MD 06/15/19 1146

## 2019-06-15 NOTE — ED Triage Notes (Signed)
Pt arrives from home via EMS. Pt reports to EMS that his urine appears pink. EMS explained to pt that the color is actually from the bag itself, not the urine. Pt insisted on being transferred for eval. He denies urinary or other complaints. Pt is A&O and is in no distress.

## 2019-06-17 LAB — URINE CULTURE: Culture: >=100000 — AB

## 2019-06-18 ENCOUNTER — Telehealth: Payer: Self-pay | Admitting: Emergency Medicine

## 2019-06-18 NOTE — Telephone Encounter (Signed)
Post ED Visit - Positive Culture Follow-up: Successful Patient Follow-Up  Culture assessed and recommendations reviewed by:  []  Elenor Quinones, Pharm.D. []  Heide Guile, Pharm.D., BCPS AQ-ID []  Parks Neptune, Pharm.D., BCPS []  Alycia Rossetti, Pharm.D., BCPS []  [place], Pharm.D., BCPS, AAHIVP []  Legrand Como, Pharm.D., BCPS, AAHIVP []  Salome Arnt, PharmD, BCPS []  Johnnette Gourd, PharmD, BCPS []  Hughes Better, PharmD, BCPS []  Leeroy Cha, PharmD Samul Dada PharmS  Positive urine culture  []  Patient discharged without antimicrobial prescription and treatment is now indicated []  Organism is resistant to prescribed ED discharge antimicrobial []  Patient with positive blood cultures  Changes discussed with ED provider: Benedetto Goad PA New antibiotic prescription symptom check, if symptoms of UTI(fever,flank pain, suprapubic pain) if symptomatic,start Nitrofurantoin 100mg  po bid x 7 days, if asymptomatic, no antibiotics needed and f/u with urology  Attempting to contact patient   [name] 06/18/2019, 1:01 PM

## 2019-06-18 NOTE — Progress Notes (Signed)
ED Antimicrobial Stewardship Positive Culture Follow Up   [name] is an 79 y.o. male who presented to [facility] on 06/15/2019 with a chief complaint of  Chief Complaint  Patient presents with  . Vascular Access Problem  . urinary issues    Recent Results (from the past 720 hour(s))  Urine culture     Status: Abnormal   Collection Time: 06/05/19  7:55 AM   Specimen: Urine, Clean Catch  Result Value Ref Range Status   Specimen Description   Final    URINE, CLEAN CATCH Performed at [facility], [place] [address] [place], [place] [address]    Special Requests   Final    NONE Performed at [facility], [place] [address], [place], [place] [address]    Culture >=100,000 COLONIES/mL KLEBSIELLA PNEUMONIAE (A)  Final   Report Status 06/07/2019 FINAL  Final   Organism ID, Bacteria KLEBSIELLA PNEUMONIAE (A)  Final      Susceptibility   Klebsiella pneumoniae - MIC*    AMPICILLIN >=32 RESISTANT Resistant     CEFAZOLIN <=4 SENSITIVE Sensitive     CEFTRIAXONE <=0.25 SENSITIVE Sensitive     CIPROFLOXACIN <=0.25 SENSITIVE Sensitive     GENTAMICIN <=1 SENSITIVE Sensitive     IMIPENEM <=0.25 SENSITIVE Sensitive     NITROFURANTOIN 64 INTERMEDIATE Intermediate     TRIMETH/SULFA <=20 SENSITIVE Sensitive     AMPICILLIN/SULBACTAM 4 SENSITIVE Sensitive     PIP/TAZO <=4 SENSITIVE Sensitive     * >=100,000 COLONIES/mL KLEBSIELLA PNEUMONIAE  Urine Culture     Status: Abnormal   Collection Time: 06/15/19  9:29 AM   Specimen: Urine, Random  Result Value Ref Range Status   Specimen Description   Final    URINE, RANDOM Performed at [facility] [address] [place], [place] [address]    Special Requests   Final    NONE Performed at [facility], [place] [address] [place], [place] [address]    Culture >=100,000 COLONIES/mL STAPHYLOCOCCUS EPIDERMIDIS (A)  Final   Report Status 06/17/2019 FINAL  Final   Organism ID,  Bacteria STAPHYLOCOCCUS EPIDERMIDIS (A)  Final      Susceptibility   Staphylococcus epidermidis - MIC*    CIPROFLOXACIN >=8 RESISTANT Resistant     GENTAMICIN <=0.5 SENSITIVE Sensitive     NITROFURANTOIN <=16 SENSITIVE Sensitive     OXACILLIN >=4 RESISTANT Resistant     TETRACYCLINE 2 SENSITIVE Sensitive     VANCOMYCIN 2 SENSITIVE Sensitive     TRIMETH/SULFA 40 SENSITIVE Sensitive     CLINDAMYCIN <=0.25 SENSITIVE Sensitive     RIFAMPIN <=0.5 SENSITIVE Sensitive     Inducible Clindamycin NEGATIVE Sensitive     * >=100,000 COLONIES/mL STAPHYLOCOCCUS EPIDERMIDIS    [x]  Patient discharged originally without antimicrobial agent and treatment may now be indicated  Plan: RN to call patient and assess for symptoms of UTI (fever, flank pain, suprapubic pain). If symptomatic or symptoms worsening, prescribe the following prescription. If asymptomatic, no antibiotics necessary and have patient follow up with urology outpatient.   New antibiotic prescription: nitrofurantoin 100 mg PO BID x7 days, no refills  ED Provider: Benedetto Goad, PA-C    Lenis Noon, PharmD 06/18/2019, 11:36 AM Clinical Pharmacist [phone]

## 2019-06-19 DIAGNOSIS — R338 Other retention of urine: Secondary | ICD-10-CM | POA: Diagnosis not present

## 2019-06-19 IMAGING — CR DG CHEST 2V
2 series · 2 of 2 positions shown · non-contrast
Comparison: Radiographs April 03, 2018.

CLINICAL DATA: Palpitations.

EXAM:
CHEST - 2 VIEW

[chest lat]
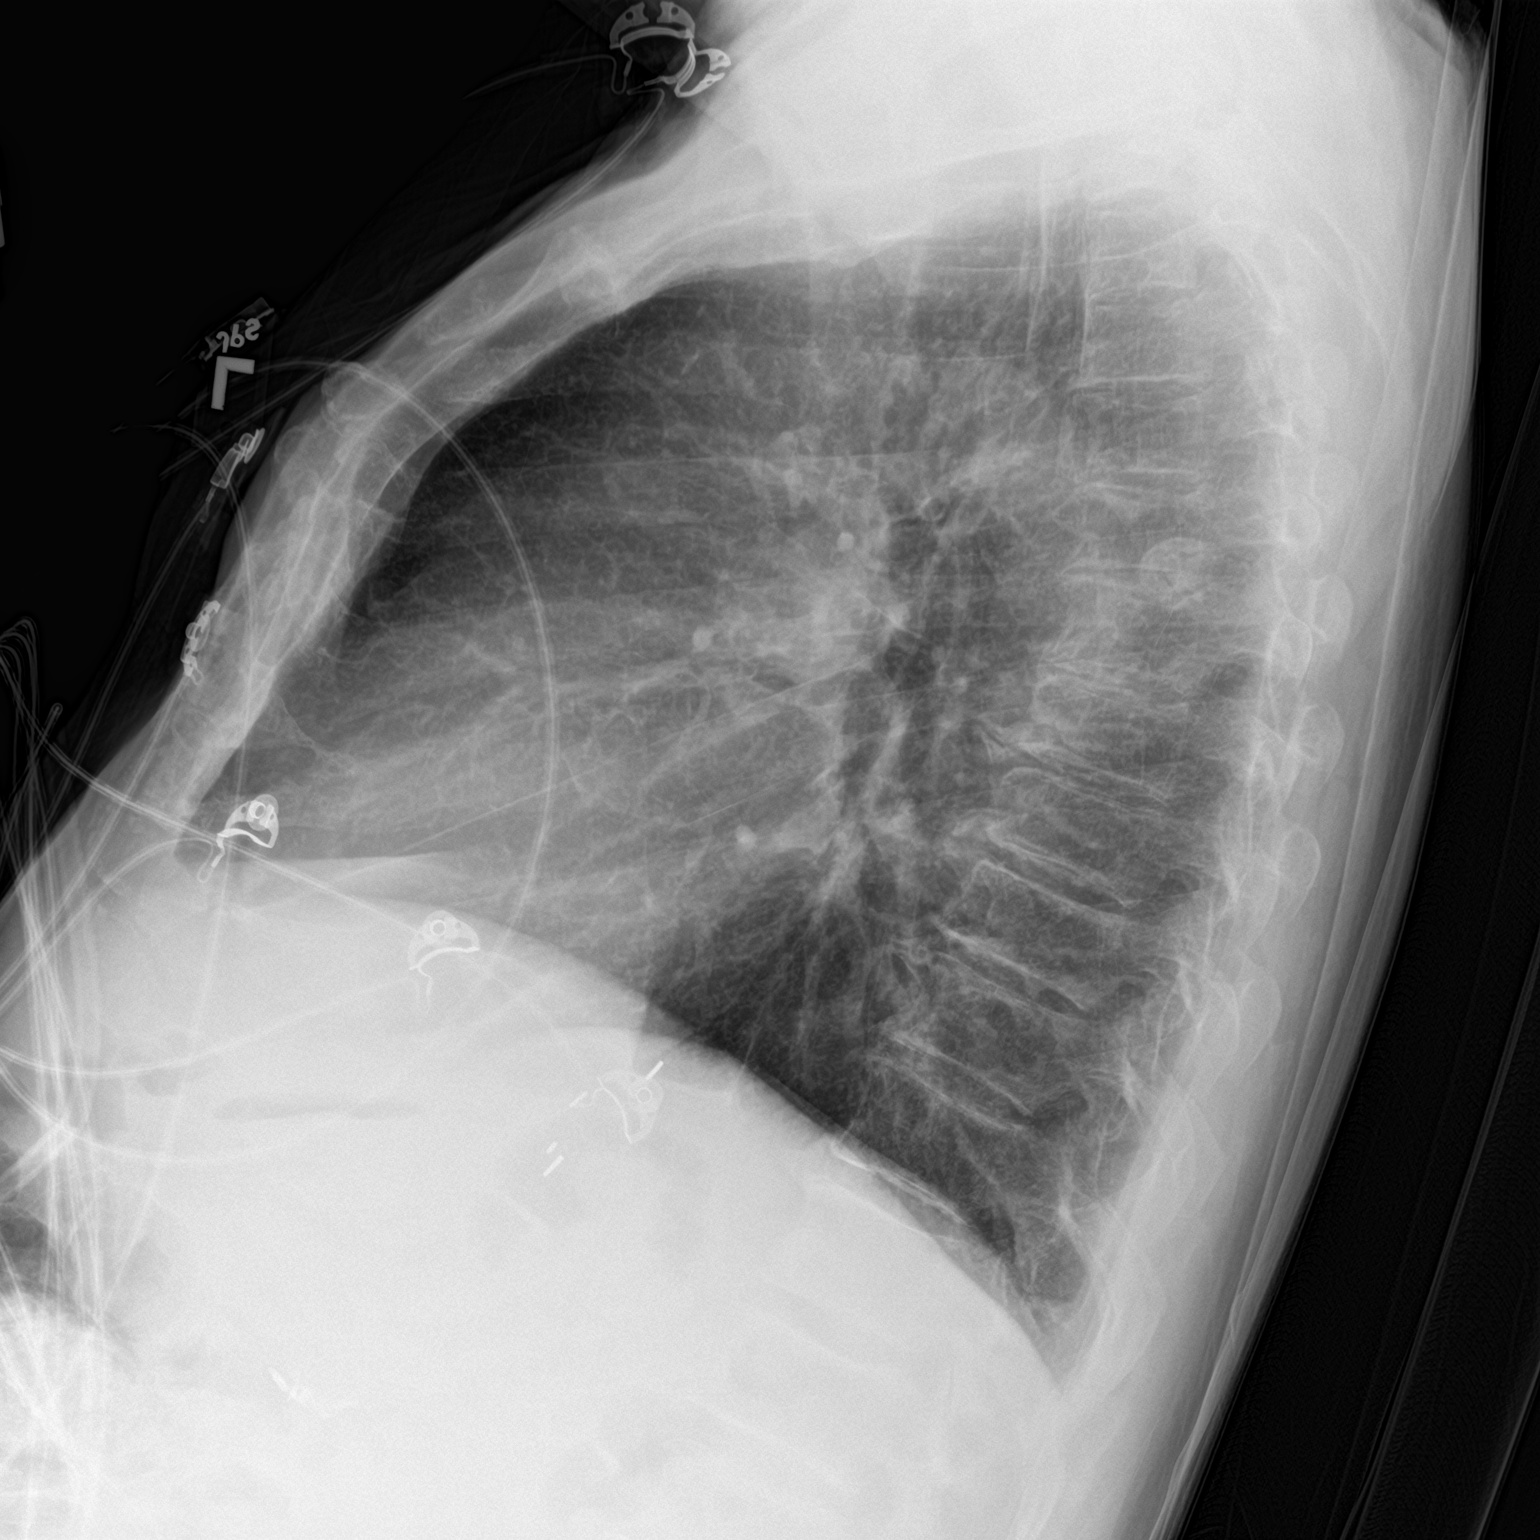

[chest ap]
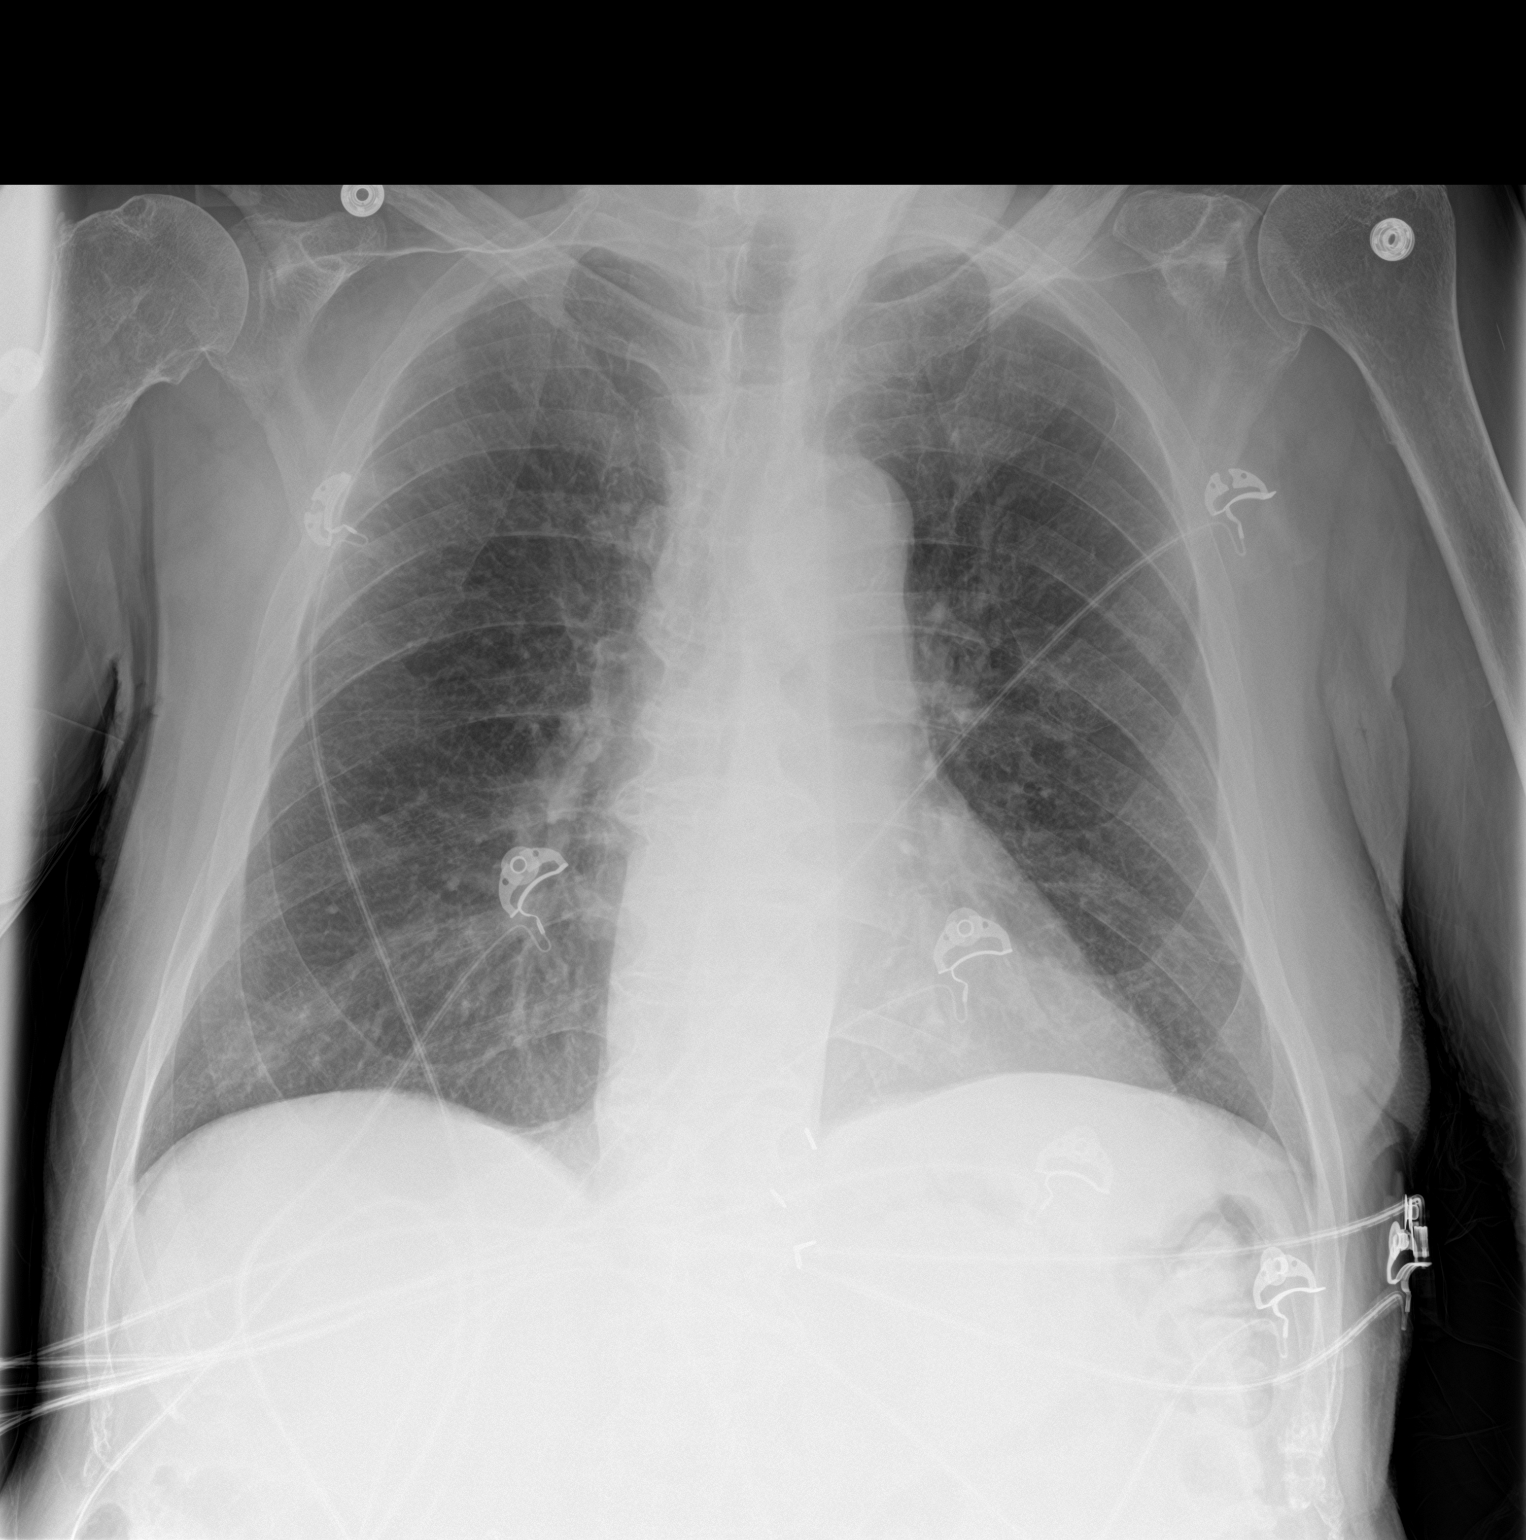

[2 of 2 positions shown; findings below may reference images not displayed]

FINDINGS: The heart size and mediastinal contours are within normal limits.
Both lungs are clear. The visualized skeletal structures are
unremarkable.
IMPRESSION: No active cardiopulmonary disease.

## 2019-06-23 IMAGING — DX DG CHEST 2V
2 series · 2 of 2 positions shown · non-contrast
Comparison: 04/08/2018

CLINICAL DATA: Short of breath and weak for a few days. History of
COPD.

EXAM:
CHEST - 2 VIEW

[w chest pa]
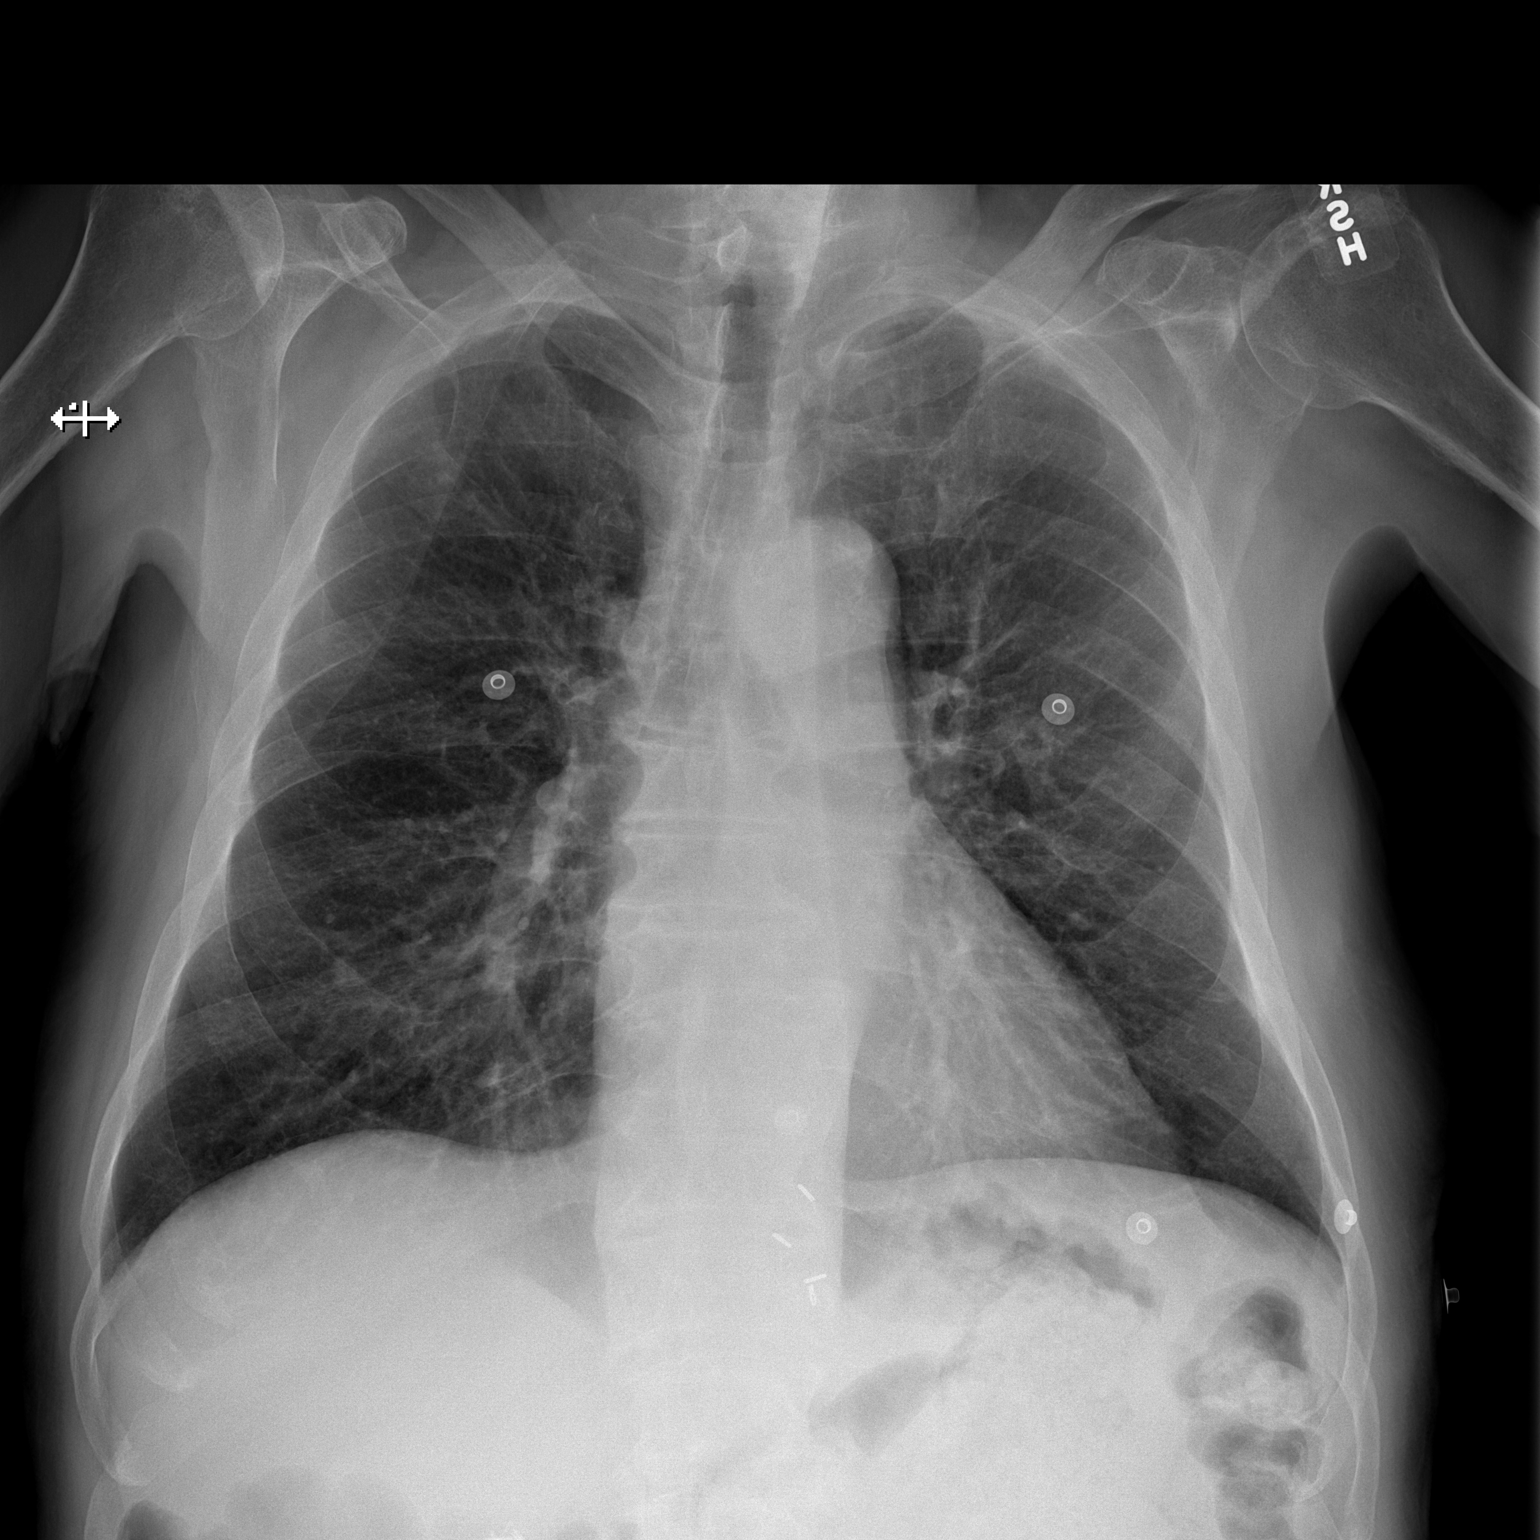

[w chest lat]
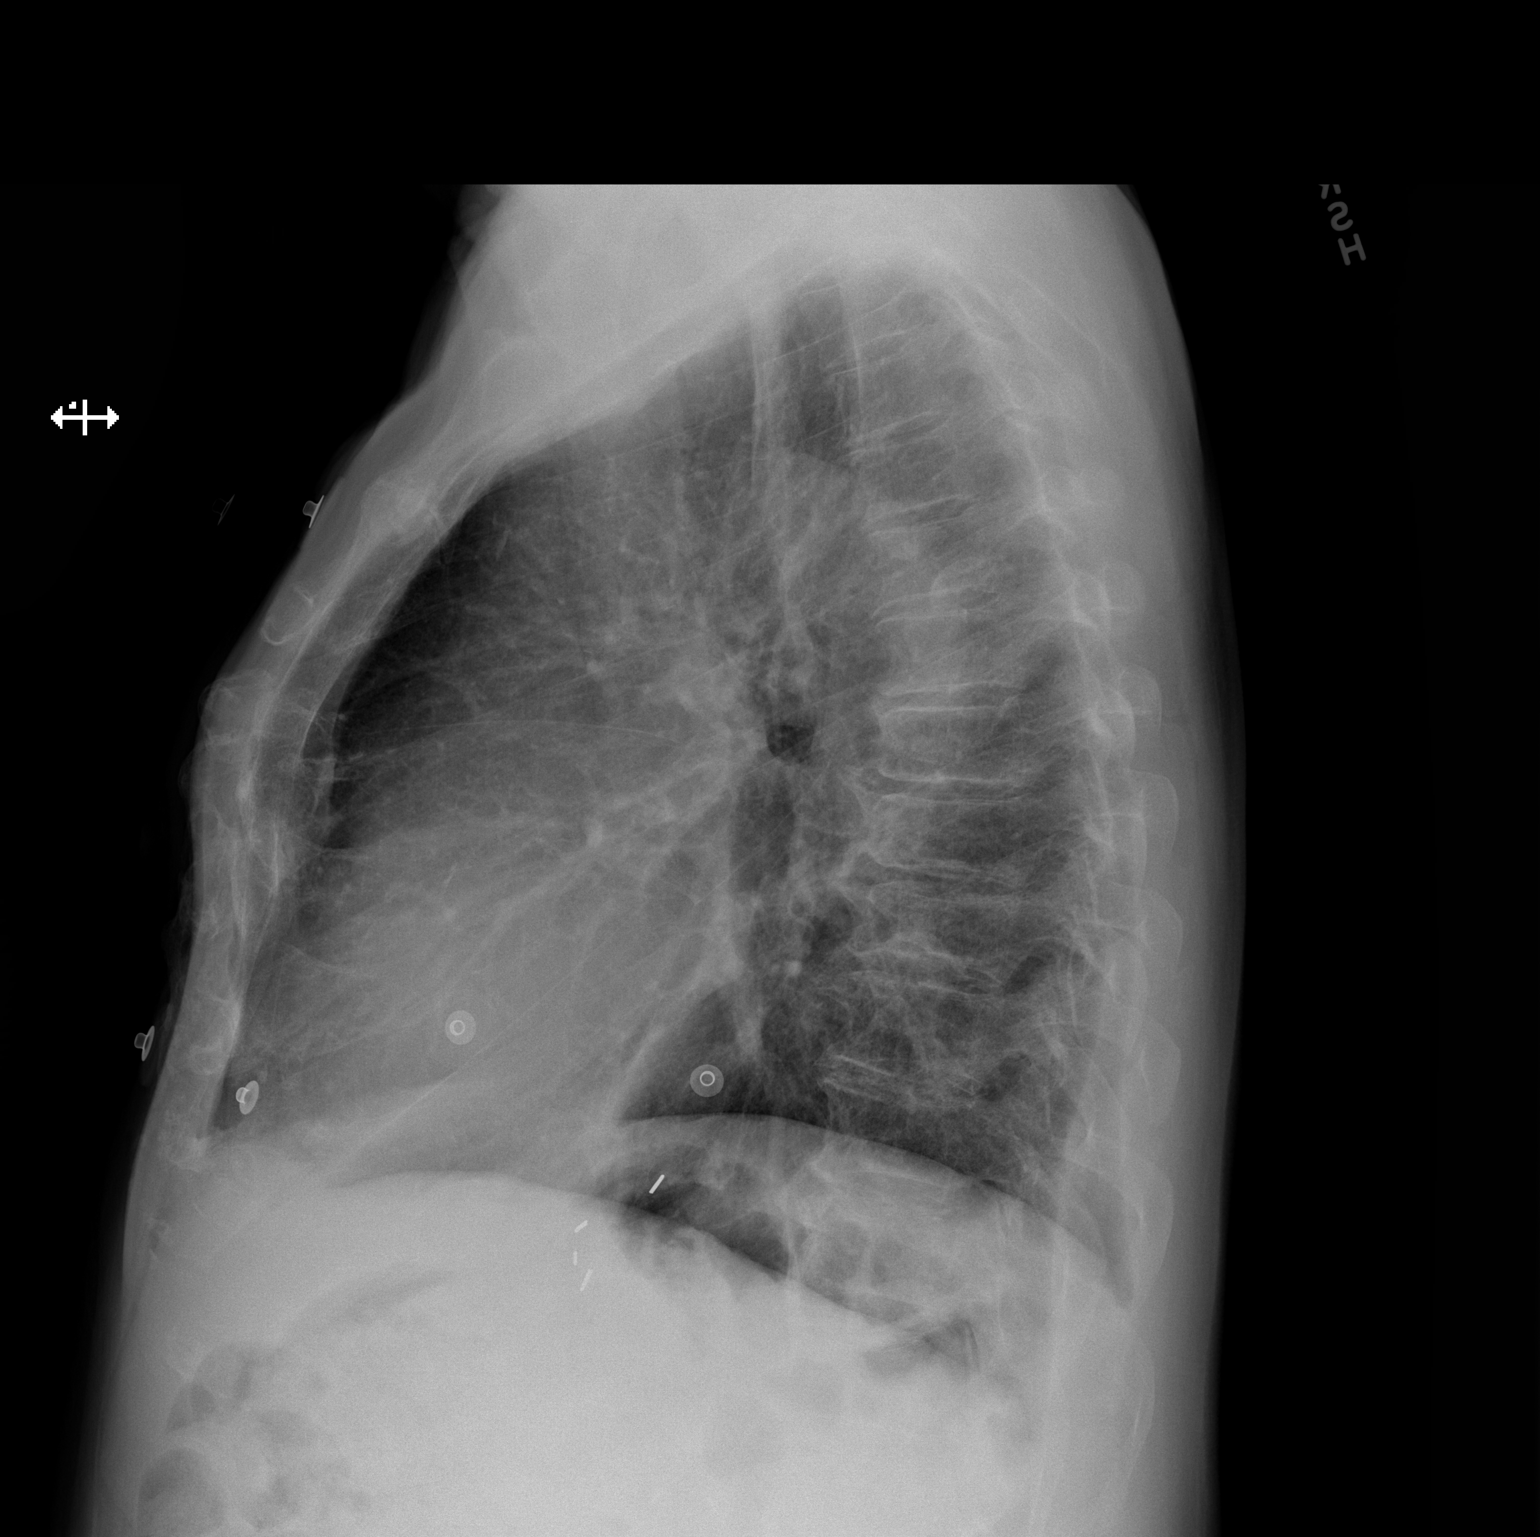

[2 of 2 positions shown; findings below may reference images not displayed]

FINDINGS: Cardiac silhouette is normal in size. No mediastinal or hilar
masses. No evidence of adenopathy.

Clear lungs.  No pleural effusion or pneumothorax.

Skeletal structures are intact.
IMPRESSION: No active cardiopulmonary disease.

## 2019-06-25 ENCOUNTER — Emergency Department (HOSPITAL_COMMUNITY)
Admission: EM | Admit: 2019-06-25 | Discharge: 2019-06-25 | Disposition: A | Discharge status: Home / Self Care | Payer: Medicare Other | Attending: Emergency Medicine | Admitting: Emergency Medicine

## 2019-06-25 ENCOUNTER — Encounter (HOSPITAL_COMMUNITY): Payer: Self-pay | Admitting: Emergency Medicine

## 2019-06-25 ENCOUNTER — Other Ambulatory Visit: Payer: Self-pay

## 2019-06-25 DIAGNOSIS — Z9049 Acquired absence of other specified parts of digestive tract: Secondary | ICD-10-CM | POA: Insufficient documentation

## 2019-06-25 DIAGNOSIS — Y731 Therapeutic (nonsurgical) and rehabilitative gastroenterology and urology devices associated with adverse incidents: Secondary | ICD-10-CM | POA: Diagnosis not present

## 2019-06-25 DIAGNOSIS — Z7984 Long term (current) use of oral hypoglycemic drugs: Secondary | ICD-10-CM | POA: Diagnosis not present

## 2019-06-25 DIAGNOSIS — F17210 Nicotine dependence, cigarettes, uncomplicated: Secondary | ICD-10-CM | POA: Diagnosis not present

## 2019-06-25 DIAGNOSIS — Z8673 Personal history of transient ischemic attack (TIA), and cerebral infarction without residual deficits: Secondary | ICD-10-CM | POA: Diagnosis not present

## 2019-06-25 DIAGNOSIS — J449 Chronic obstructive pulmonary disease, unspecified: Secondary | ICD-10-CM | POA: Diagnosis not present

## 2019-06-25 DIAGNOSIS — Z7982 Long term (current) use of aspirin: Secondary | ICD-10-CM | POA: Insufficient documentation

## 2019-06-25 DIAGNOSIS — Z950 Presence of cardiac pacemaker: Secondary | ICD-10-CM | POA: Insufficient documentation

## 2019-06-25 DIAGNOSIS — Z79899 Other long term (current) drug therapy: Secondary | ICD-10-CM | POA: Diagnosis not present

## 2019-06-25 DIAGNOSIS — I110 Hypertensive heart disease with heart failure: Secondary | ICD-10-CM | POA: Insufficient documentation

## 2019-06-25 DIAGNOSIS — R5381 Other malaise: Secondary | ICD-10-CM | POA: Diagnosis not present

## 2019-06-25 DIAGNOSIS — I2510 Atherosclerotic heart disease of native coronary artery without angina pectoris: Secondary | ICD-10-CM | POA: Diagnosis not present

## 2019-06-25 DIAGNOSIS — I5032 Chronic diastolic (congestive) heart failure: Secondary | ICD-10-CM | POA: Diagnosis not present

## 2019-06-25 DIAGNOSIS — R402411 Glasgow coma scale score 13-15, in the field [EMT or ambulance]: Secondary | ICD-10-CM | POA: Diagnosis not present

## 2019-06-25 DIAGNOSIS — E119 Type 2 diabetes mellitus without complications: Secondary | ICD-10-CM | POA: Diagnosis not present

## 2019-06-25 DIAGNOSIS — T83098A Other mechanical complication of other indwelling urethral catheter, initial encounter: Secondary | ICD-10-CM | POA: Diagnosis not present

## 2019-06-25 DIAGNOSIS — T83038A Leakage of other indwelling urethral catheter, initial encounter: Secondary | ICD-10-CM | POA: Diagnosis present

## 2019-06-25 DIAGNOSIS — T839XXA Unspecified complication of genitourinary prosthetic device, implant and graft, initial encounter: Secondary | ICD-10-CM

## 2019-06-25 LAB — URINALYSIS, ROUTINE W REFLEX MICROSCOPIC
Bilirubin Urine: NEGATIVE
Glucose, UA: NEGATIVE mg/dL
Ketones, ur: NEGATIVE mg/dL
Nitrite: NEGATIVE
Protein, ur: NEGATIVE mg/dL
Specific Gravity, Urine: 1.008 (ref 1.005–1.030)
pH: 6 (ref 5.0–8.0)

## 2019-06-25 LAB — CBC
HCT: 29.8 % — ABNORMAL LOW (ref 39.0–52.0)
Hemoglobin: 9.2 g/dL — ABNORMAL LOW (ref 13.0–17.0)
MCH: 29.6 pg (ref 26.0–34.0)
MCHC: 30.9 g/dL (ref 30.0–36.0)
MCV: 95.8 fL (ref 80.0–100.0)
Platelets: 125 10*3/uL — ABNORMAL LOW (ref 150–400)
RBC: 3.11 MIL/uL — ABNORMAL LOW (ref 4.22–5.81)
RDW: 15.1 % (ref 11.5–15.5)
WBC: 4.1 10*3/uL (ref 4.0–10.5)
nRBC: 0 % (ref 0.0–0.2)

## 2019-06-25 LAB — I-STAT CHEM 8, ED
BUN: 16 mg/dL (ref 8–23)
Calcium, Ion: 1.14 mmol/L — ABNORMAL LOW (ref 1.15–1.40)
Chloride: 108 mmol/L (ref 98–111)
Creatinine, Ser: 1.3 mg/dL — ABNORMAL HIGH (ref 0.61–1.24)
Glucose, Bld: 153 mg/dL — ABNORMAL HIGH (ref 70–99)
HCT: 28 % — ABNORMAL LOW (ref 39.0–52.0)
Hemoglobin: 9.5 g/dL — ABNORMAL LOW (ref 13.0–17.0)
Potassium: 3.8 mmol/L (ref 3.5–5.1)
Sodium: 140 mmol/L (ref 135–145)
TCO2: 23 mmol/L (ref 22–32)

## 2019-06-25 NOTE — ED Triage Notes (Signed)
Patient patient presents with a leaking catheter bag. A foley was placed on the 21st of the month. He reports after multiple changes of the bag, the bags continue to leak. He also reports being dehydrated. He has decreased PO intake in order to keep his cath bag from leaking.    EMS vitals: 130/70 BP 82 HR 96% O2 sat on room air 156 CBG

## 2019-06-25 NOTE — ED Provider Notes (Signed)
[facility] DE[facility] Provider Note   CSN: [id number] Arrival date & time: 06/25/19  1110     History Chief Complaint  Patient presents with  . Catheter Bag Leakage    [name] is a 79 y.o. male.  The history is provided by the patient and medical records. No language interpreter was used.     79 year old male with hx of DM, urinary retention a chronic indwelling Foley catheter last changed on the 21st of this month presenting for evaluation of leakage around his Foley bag.  Patient reports since his Foley was changed, it has been leaking both around his penis and well as the bag.  He denies any associated pain with that however states that he tries to not drinking too much fluid due to worries of leakage.  No associated fever chills no back pain.  He is trying to manipulate the bag and the Foley without improvement. He last changed his Foley leg bag last night. He report felling dehydrated due to not drinking enough fluid.   Past Medical History:  Diagnosis Date  . Allergic rhinitis   . Anemia    takes Ferrous Sulfate daily  . Arthritis    "all over"  . Atrial flutter ([place])   . Balance problem 01/2014  . CAP (community acquired pneumonia) 09/18/2014  . Cervical radiculopathy due to degenerative joint disease of spine   . Chronic coronary artery disease    a. 03/2010 Nonocclusive disease by cath, performed for ST elevations on ECG;  b. 06/2013 Lexi MV: EF 60%, no ischemia.  [name] COPD (chronic obstructive pulmonary disease) ([place])   . Diabetes mellitus type II    takes Metformin and Lantus daily  . Diastolic CHF, chronic ([place])    a. 12/2012 EF 55-60%, diast dysfxn, triv MR, mildly dil LA/RA.  [name] Dyspnea    with pain  . Essential hypertension    takes Diltiazem daily  . Frail elderly   . GERD (gastroesophageal reflux disease)   . History of blood transfusion 1982   "when I had stomach OR"  . History of bronchitis    1998  . History of CVA  (cerebrovascular accident)   . History of gastric ulcer   . History of kidney stones   . Hypercholesterolemia    takes Pravastatin daily  . Intrinsic eczema   . Legally blind in left eye, as defined in [place]   . Obesity   . PAF (paroxysmal atrial fibrillation) (HCC)    Recurrent after atrial flutter (a. 07/2010 Status post caval tricuspid isthmus ablation by Dr. Midge Aver Metoprolol daily), currently controlled on flecainide plus diltiazem and Coumadin  . Urinary retention   . Vitamin D deficiency   . Weakness    numbness and tingling both hands    Patient Active Problem List   Diagnosis Date Noted  . Cardiac pacemaker in situ 10/29/2018  . Major depressive disorder, recurrent severe without psychotic features ([employer]) 04/15/2018  . Depression   . Atrial fibrillation with RVR ([place]) 11/08/2017  . HTN (hypertension) 05/21/2017  . Diabetes mellitus type II 05/21/2017  . Diastolic CHF, chronic ([place]) 05/21/2017  . COPD (chronic obstructive pulmonary disease) ([place]) 05/21/2017  . H/O atrial flutter 05/21/2017  . Abdominal pain 10/02/2016  . Constipation 10/02/2016  . Left lower quadrant pain   . Heme positive stool   . Symptomatic anemia 07/10/2016  . Cardiac syncope 05/15/2015  . Cervical disc disease 05/15/2015  . Anemia 05/15/2015  . History of CVA (  cerebrovascular accident) 04/03/2014  . Benign neoplasm of rectum and anal canal 12/02/2013  . Upper GI bleeding 11/30/2013  . Tobacco use disorder 11/30/2013  . Type 2 diabetes mellitus ([place]) 01/22/2013  . Hyperlipidemia   . Obesity (BMI 30-39.9) 12/14/2012  . Persistent atrial fibrillation ([place]) 10/07/2010  . Atrial flutter - status post CTI ablation 07/29/2010  . Essential hypertension 07/29/2010  . Chronic diastolic congestive heart failure ([place]) 07/29/2010  . Atherosclerotic heart disease of native coronary artery with other forms of angina pectoris ([place]) 07/29/2010    Past Surgical History:  Procedure Laterality Date  .  ATRIAL ABLATION SURGERY  08/05/10   CTI ablation for atrial flutter by JA  . CARDIAC CATHETERIZATION  2012   nl LV function, no occlusive CAD, PAF  . CARDIOVERSION  12/07/2010    Successful direct current cardioversion with atrial fibrillation to normal sinus rhythm  . CARPAL TUNNEL RELEASE Bilateral 01/30/2014   Procedure: BILATERAL CARPAL TUNNEL RELEASE;  Surgeon: Marianna Payment, MD;  Location: [facility];  Service: Orthopedics;  Laterality: Bilateral;  . CATARACT EXTRACTION W/ INTRAOCULAR LENS  IMPLANT, BILATERAL Bilateral   . COLONOSCOPY [name] 12/02/2013   Procedure: COLONOSCOPY;  Surgeon: Irene Shipper, MD;  Location: [place];  Service: Endoscopy;  Laterality: [name];  . ESOPHAGOGASTRODUODENOSCOPY [name] 09/22/2014   Procedure: ESOPHAGOGASTRODUODENOSCOPY (EGD);  Surgeon: Ronald Lobo, MD;  Location[facility] ENDOSCOPY;  Service: Endoscopy;  Laterality: [name];  . ESOPHAGOGASTRODUODENOSCOPY [name] 07/11/2016   Procedure: ESOPHAGOGASTRODUODENOSCOPY (EGD);  Surgeon: Doran Stabler, MD;  Location: [facility] ENDOSCOPY;  Service: Endoscopy;  Laterality: [name];  . EXTRACORPOREAL SHOCK WAVE LITHOTRIPSY Right 07/15/2018   Procedure: EXTRACORPOREAL SHOCK WAVE LITHOTRIPSY (ESWL);  Surgeon: Lucas Mallow, MD;  Location: [facility];  Service: Urology;  Laterality: Right;  . INCISION AND DRAINAGE ABSCESS / HEMATOMA OF BURSA / KNEE / THIGH Left 1998   knee  . KNEE BURSECTOMY Left 1998  . LAPAROSCOPIC CHOLECYSTECTOMY  03/2010  . LEFT HEART CATH AND CORONARY ANGIOGRAPHY [name] 10/05/2017   Procedure: LEFT HEART CATH AND CORONARY ANGIOGRAPHY;  Surgeon: Martinique, Peter M, MD;  Location: [place] CV LAB;  Service: Cardiovascular;  Laterality: [name];  . NM MYOVIEW LTD  07/22/2013   Normal EF ~60%, no ischemia or infarction.  [name] PACEMAKER IMPLANT [name] 10/21/2018   Procedure: PACEMAKER IMPLANT;  Surgeon: Deboraha Sprang, MD;  Location: [place] CV LAB;  Service: Cardiovascular;  Laterality: [name];  . PARTIAL GASTRECTOMY  1982   subtotal;  "took out 30% for ulcers"  . TRANSTHORACIC ECHOCARDIOGRAM  02/16/2014   EF 60%, no RWMA. - otherwise normal  . YAG LASER APPLICATION Bilateral        Family History  Problem Relation Age of Onset  . Breast cancer Mother   . Diabetes Mother   . Kidney disease Mother   . Heart attack Father   . Heart disease Father   . Heart attack Brother   . Leukemia Daughter     Social History   Tobacco Use  . Smoking status: Current Every Day Smoker    Packs/day: 0.25    Years: 57.00    Pack years: 14.25    Types: Cigarettes  . Smokeless tobacco: Never Used  . Tobacco comment: He's been smoking between 0.5 and 2 ppd since age 38. QUIT 3 MONTHS AGO  Substance Use Topics  . Alcohol use: No    Alcohol/week: 0.0 standard drinks    Comment: "quit drinking in 1986"  . Drug use: No  Home Medications Prior to Admission medications   Medication Sig Start Date End Date Taking? Authorizing Provider  Accu-Chek FastClix Lancets MISC USE TO TEST BLOOD SUGAR TWICE DAILY. E11.00 12/04/18   Charlott Rakes, MD  acetaminophen (TYLENOL) 650 MG CR tablet Take 650 mg by mouth every 8 (eight) hours as needed for pain.    [provider]  aspirin EC 81 MG tablet Take 81 mg by mouth daily.    [provider]  ciprofloxacin (CIPRO) 500 MG tablet Take 1 tablet (500 mg total) by mouth 2 (two) times daily. 06/05/19   Isla Pence, MD  citalopram (CELEXA) 20 MG tablet Take 1 tablet (20 mg total) by mouth daily. 10/02/18   Scot Jun, FNP  diclofenac sodium (VOLTAREN) 1 % GEL Apply 2 g topically 2 (two) times daily.  11/25/18   [provider]  diclofenac Sodium (VOLTAREN) 1 % GEL Apply 2 g topically 4 (four) times daily. Patient not taking: Reported on 06/05/2019 02/18/19   Ladell Pier, MD  diltiazem (CARDIZEM CD) 120 MG 24 hr capsule Take 1 capsule (120 mg total) by mouth 2 (two) times daily. 04/07/19   Camnitz, Ocie Doyne, MD  ferrous sulfate 325 (65 FE) MG tablet Take  325 mg by mouth daily with breakfast.    [provider]  gabapentin (NEURONTIN) 100 MG capsule Take 1 capsule (100 mg total) by mouth at bedtime. 04/22/19   Ladell Pier, MD  glimepiride (AMARYL) 2 MG tablet Take 0.5 tablets (1 mg total) by mouth daily before breakfast. Patient taking differently: Take 2 mg by mouth daily before breakfast.  04/22/19   Ladell Pier, MD  glucose blood (ACCU-CHEK GUIDE) test strip USE TO TEST BLOOD SUGAR TWICE DAILY. E11.00 12/19/18   Charlott Rakes, MD  nitroGLYCERIN (NITROSTAT) 0.4 MG SL tablet Place 1 tablet (0.4 mg total) under the tongue every 5 (five) minutes x 3 doses as needed for chest pain. 11/19/15   Erlene Quan, PA-C  pantoprazole (PROTONIX) 40 MG tablet Take 1 tablet (40 mg total) by mouth 2 (two) times daily before a meal. Dx: K21.9 06/03/19   Nicolette Bang, DO  pravastatin (PRAVACHOL) 40 MG tablet Take 1 tablet (40 mg total) by mouth every evening. 10/02/18   Scot Jun, FNP  tamsulosin (FLOMAX) 0.4 MG CAPS capsule Take 1 capsule (0.4 mg total) by mouth daily. 10/02/18   Scot Jun, FNP    Allergies    Cyclobenzaprine and Other  Review of Systems   Review of Systems  Constitutional: Negative for fever.  Gastrointestinal: Negative for abdominal pain.  Genitourinary: Negative for dysuria.    Physical Exam Updated Vital Signs BP (!) 112/57   Pulse 90   Temp 98.1 F (36.7 C) (Oral)   Resp 18   SpO2 98%   Physical Exam Vitals and nursing note reviewed. Exam conducted with a chaperone present.  Constitutional:      General: He is not in acute distress.    Appearance: He is well-developed.  HENT:     Head: Atraumatic.  Eyes:     Conjunctiva/sclera: Conjunctivae normal.  Abdominal:     General: Abdomen is flat.     Palpations: Abdomen is soft.     Tenderness: There is no abdominal tenderness.  Genitourinary:    Comments: Indwelling Foley catheter attached to a Foley leg bag without any  obvious leakage noted. Musculoskeletal:     Cervical back: Neck supple.  Skin:  Findings: No rash.  Neurological:     Mental Status: He is alert.     ED Results / Procedures / Treatments   Labs (all labs ordered are listed, but only abnormal results are displayed) Labs Reviewed  URINALYSIS, ROUTINE W REFLEX MICROSCOPIC - Abnormal; Notable for the following components:      Result Value   Hgb urine dipstick SMALL (*)    Leukocytes,Ua TRACE (*)    Bacteria, UA RARE (*)    All other components within normal limits  CBC - Abnormal; Notable for the following components:   RBC 3.11 (*)    Hemoglobin 9.2 (*)    HCT 29.8 (*)    Platelets 125 (*)    All other components within normal limits  I-STAT CHEM 8, ED - Abnormal; Notable for the following components:   Creatinine, Ser 1.30 (*)    Glucose, Bld 153 (*)    Calcium, Ion 1.14 (*)    Hemoglobin 9.5 (*)    HCT 28.0 (*)    All other components within normal limits    EKG None  Radiology No results found.  Procedures Procedures (including critical care time)  Medications Ordered in ED Medications - No data to display  ED Course  I have reviewed the triage vital signs and the nursing notes.  Pertinent labs & imaging results that were available during my care of the patient were reviewed by me and considered in my medical decision making (see chart for details).    MDM Rules/Calculators/A&P                      BP (!) 112/57   Pulse 90   Temp 98.1 F (36.7 C) (Oral)   Resp 18   SpO2 98%   Final Clinical Impression(s) / ED Diagnoses Final diagnoses:  Foley catheter problem, initial encounter ([place])    Rx / DC Orders ED Discharge Orders    None     11:42 AM Patient report leakage around his Foley catheter as well as around his penis after his indwelling Foley was exchanged 10 days ago.  No complaints of symptoms to suggest urinary tract infection or urinary retention.  Will exchange Foley catheter and  Foley bag.  1:18 PM Nurse was able to secure the Foley to the Foley bag and there has been no leakage.  However, patient now endorsed having pain to his lower back.  States an uncomfortable pain that has been ongoing for the past several days.  He report positional change sometimes does help with his pain, now he lays on the left side of his back for comfort.  Due to having indwelling Foley catheter, will check UA and basic labs.  2:03 PM Labs are at baseline.  UA without signs of urinary tract infection.  Normal WBC.  At this time, Foley is currently functional, no leakage appreciated, encourage patient to follow-up with urologist for further care.  Care discussed with Dr. Jeanell Sparrow.   Domenic Moras, PA-C 06/25/19 1405    Pattricia Boss, MD 06/26/19 [phone]

## 2019-06-25 NOTE — Discharge Instructions (Addendum)
Please call and followup with your urologist for further care of your foley catheter.

## 2019-06-26 IMAGING — CR DG HIP (WITH OR WITHOUT PELVIS) 2-3V*L*
4 series · 4 of 4 positions shown · non-contrast
Comparison: None.

CLINICAL DATA: Initial evaluation for acute left hip pain status
post fall.

EXAM:
DG HIP (WITH OR WITHOUT PELVIS) 2-3V LEFT

[t pelvis ap (1 of 2)]
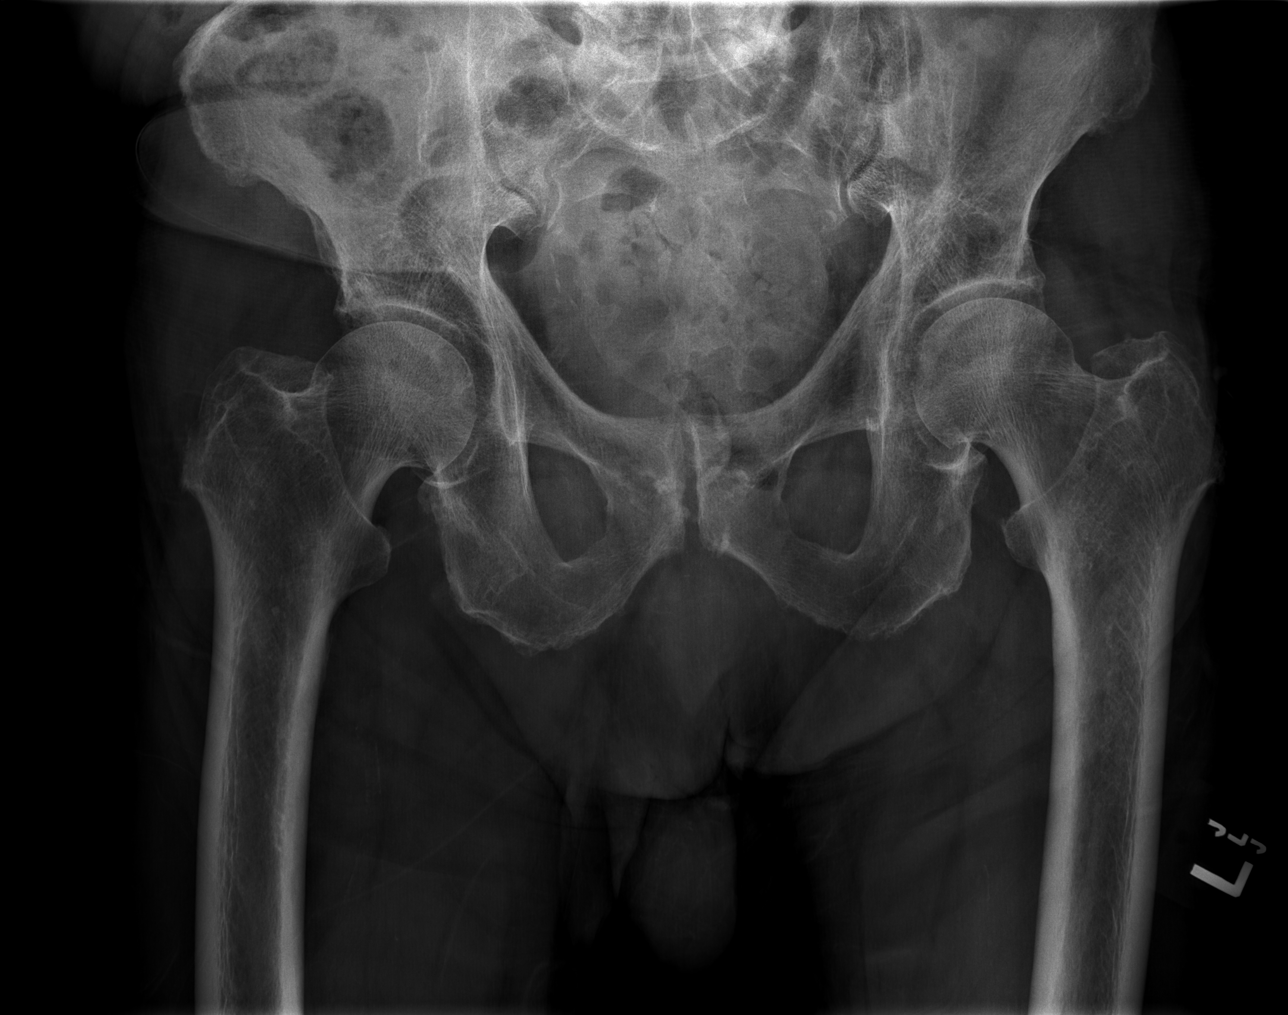

[t pelvis ap (2 of 2)]
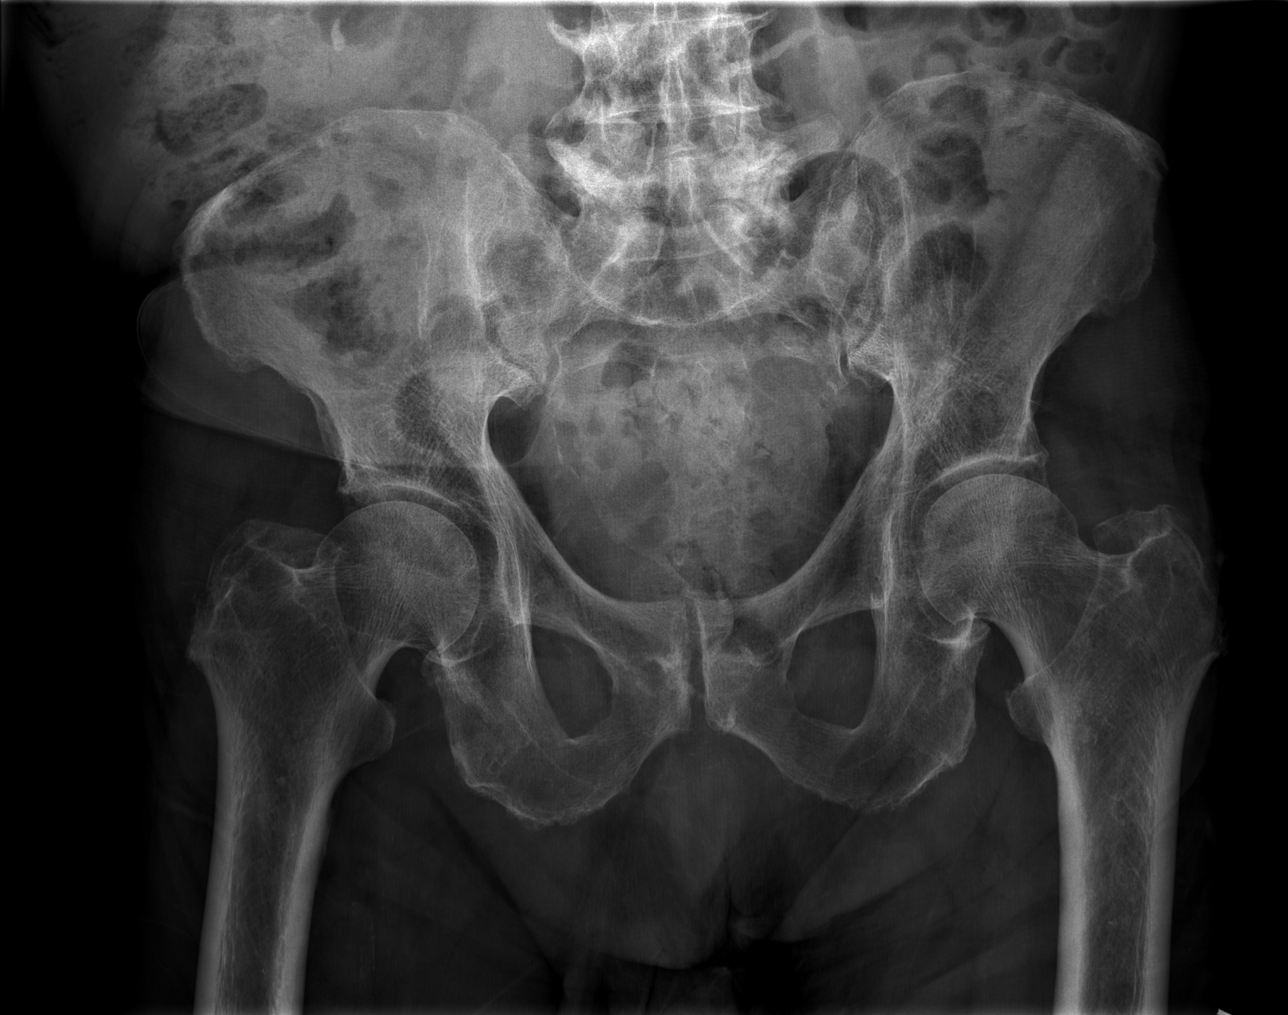

[t hip ap left]
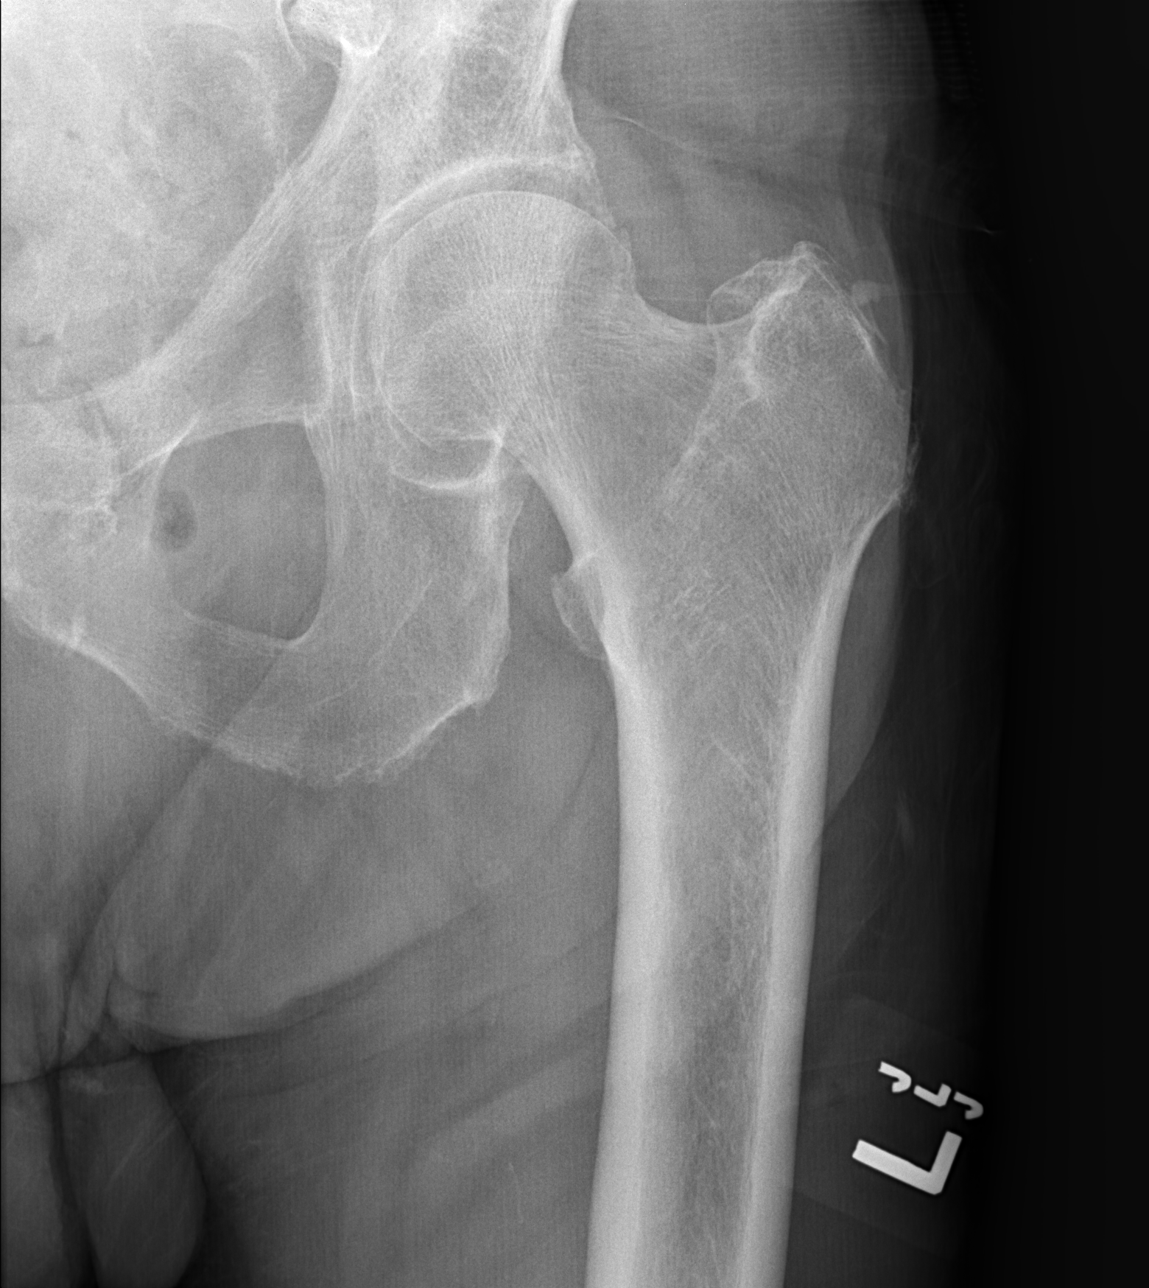

[t hip frog leg left]
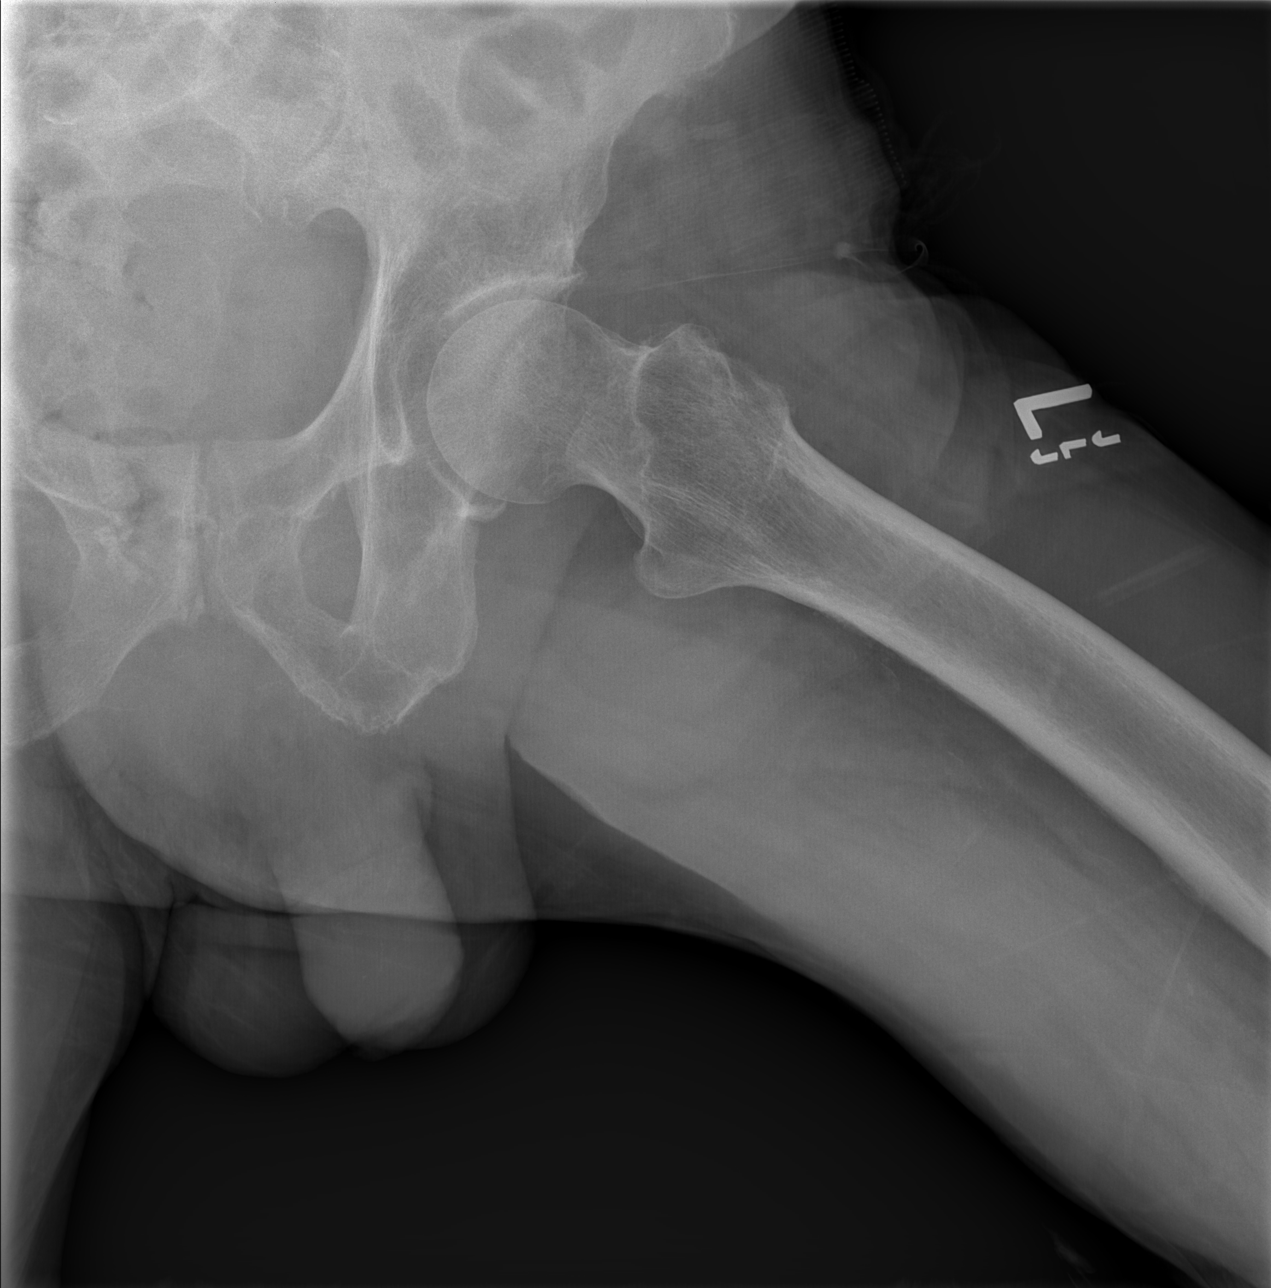

[4 of 4 positions shown; findings below may reference images not displayed]

FINDINGS: No acute fracture dislocation. Femoral heads in normal alignment
within the acetabula. Femoral head heights maintained. Bony pelvis
intact. SI joints approximated symmetric. Facet arthropathy noted
within the lower lumbar spine. No visible soft tissue injury.
IMPRESSION: No acute osseous abnormality about the left hip.

## 2019-06-29 DIAGNOSIS — T83098A Other mechanical complication of other indwelling urethral catheter, initial encounter: Secondary | ICD-10-CM | POA: Diagnosis not present

## 2019-07-01 ENCOUNTER — Telehealth: Payer: Self-pay | Admitting: Family Medicine

## 2019-07-01 DIAGNOSIS — K219 Gastro-esophageal reflux disease without esophagitis: Secondary | ICD-10-CM

## 2019-07-01 MED ORDER — PRAVASTATIN SODIUM 40 MG PO TABS: 40.0000 mg | ORAL_TABLET | Freq: Every evening | ORAL | 0 refills | Status: DC

## 2019-07-01 MED ORDER — PANTOPRAZOLE SODIUM 40 MG PO TBEC: 40.0000 mg | DELAYED_RELEASE_TABLET | Freq: Two times a day (BID) | ORAL | 0 refills | Status: DC

## 2019-07-01 NOTE — Telephone Encounter (Signed)
1) Medication(s) Requested (by name): pravastatin (PRAVACHOL) 40 MG tablet [id number]  pantoprazole (PROTONIX) 40 MG tablet [id number]   2) Pharmacy of Choice:  [id number] - [place], [address] [place] [address]    Approved medications will be sent to pharmacy, we will reach out to you if there is an issue.  Requests made after 3pm may not be addressed until following business day!

## 2019-07-01 NOTE — Telephone Encounter (Signed)
Refills sent

## 2019-07-04 ENCOUNTER — Emergency Department (HOSPITAL_COMMUNITY)
Admission: EM | Admit: 2019-07-04 | Discharge: 2019-07-04 | Disposition: A | Discharge status: Home / Self Care | Payer: Medicare Other | Attending: Emergency Medicine | Admitting: Emergency Medicine

## 2019-07-04 ENCOUNTER — Other Ambulatory Visit: Payer: Self-pay

## 2019-07-04 ENCOUNTER — Encounter (HOSPITAL_COMMUNITY): Payer: Self-pay | Admitting: *Deleted

## 2019-07-04 DIAGNOSIS — Z5321 Procedure and treatment not carried out due to patient leaving prior to being seen by health care provider: Secondary | ICD-10-CM | POA: Diagnosis not present

## 2019-07-04 DIAGNOSIS — I959 Hypotension, unspecified: Secondary | ICD-10-CM | POA: Diagnosis not present

## 2019-07-04 DIAGNOSIS — R319 Hematuria, unspecified: Secondary | ICD-10-CM | POA: Diagnosis not present

## 2019-07-04 DIAGNOSIS — R82998 Other abnormal findings in urine: Secondary | ICD-10-CM | POA: Insufficient documentation

## 2019-07-04 DIAGNOSIS — R58 Hemorrhage, not elsewhere classified: Secondary | ICD-10-CM | POA: Diagnosis not present

## 2019-07-04 NOTE — ED Triage Notes (Addendum)
BIB EMS due to urine slightly pink in drainage bag. Upon looking urine yellow with no pink noted

## 2019-07-04 NOTE — ED Provider Notes (Signed)
Pt was not in room when I went to evaluate him. I did not establish care with him.    Bishop Dublin 07/04/19 1937    Drenda Freeze, MD 07/05/19 Shelah Lewandowsky

## 2019-07-04 NOTE — ED Notes (Signed)
Patient called for room placement, registration notified staff patient has left the department.

## 2019-07-09 IMAGING — CR DG CHEST 2V
2 series · 2 of 2 positions shown · non-contrast
Comparison: Radiographs April 14, 2018.

CLINICAL DATA: Cough.

EXAM:
CHEST - 2 VIEW

[chest lat]
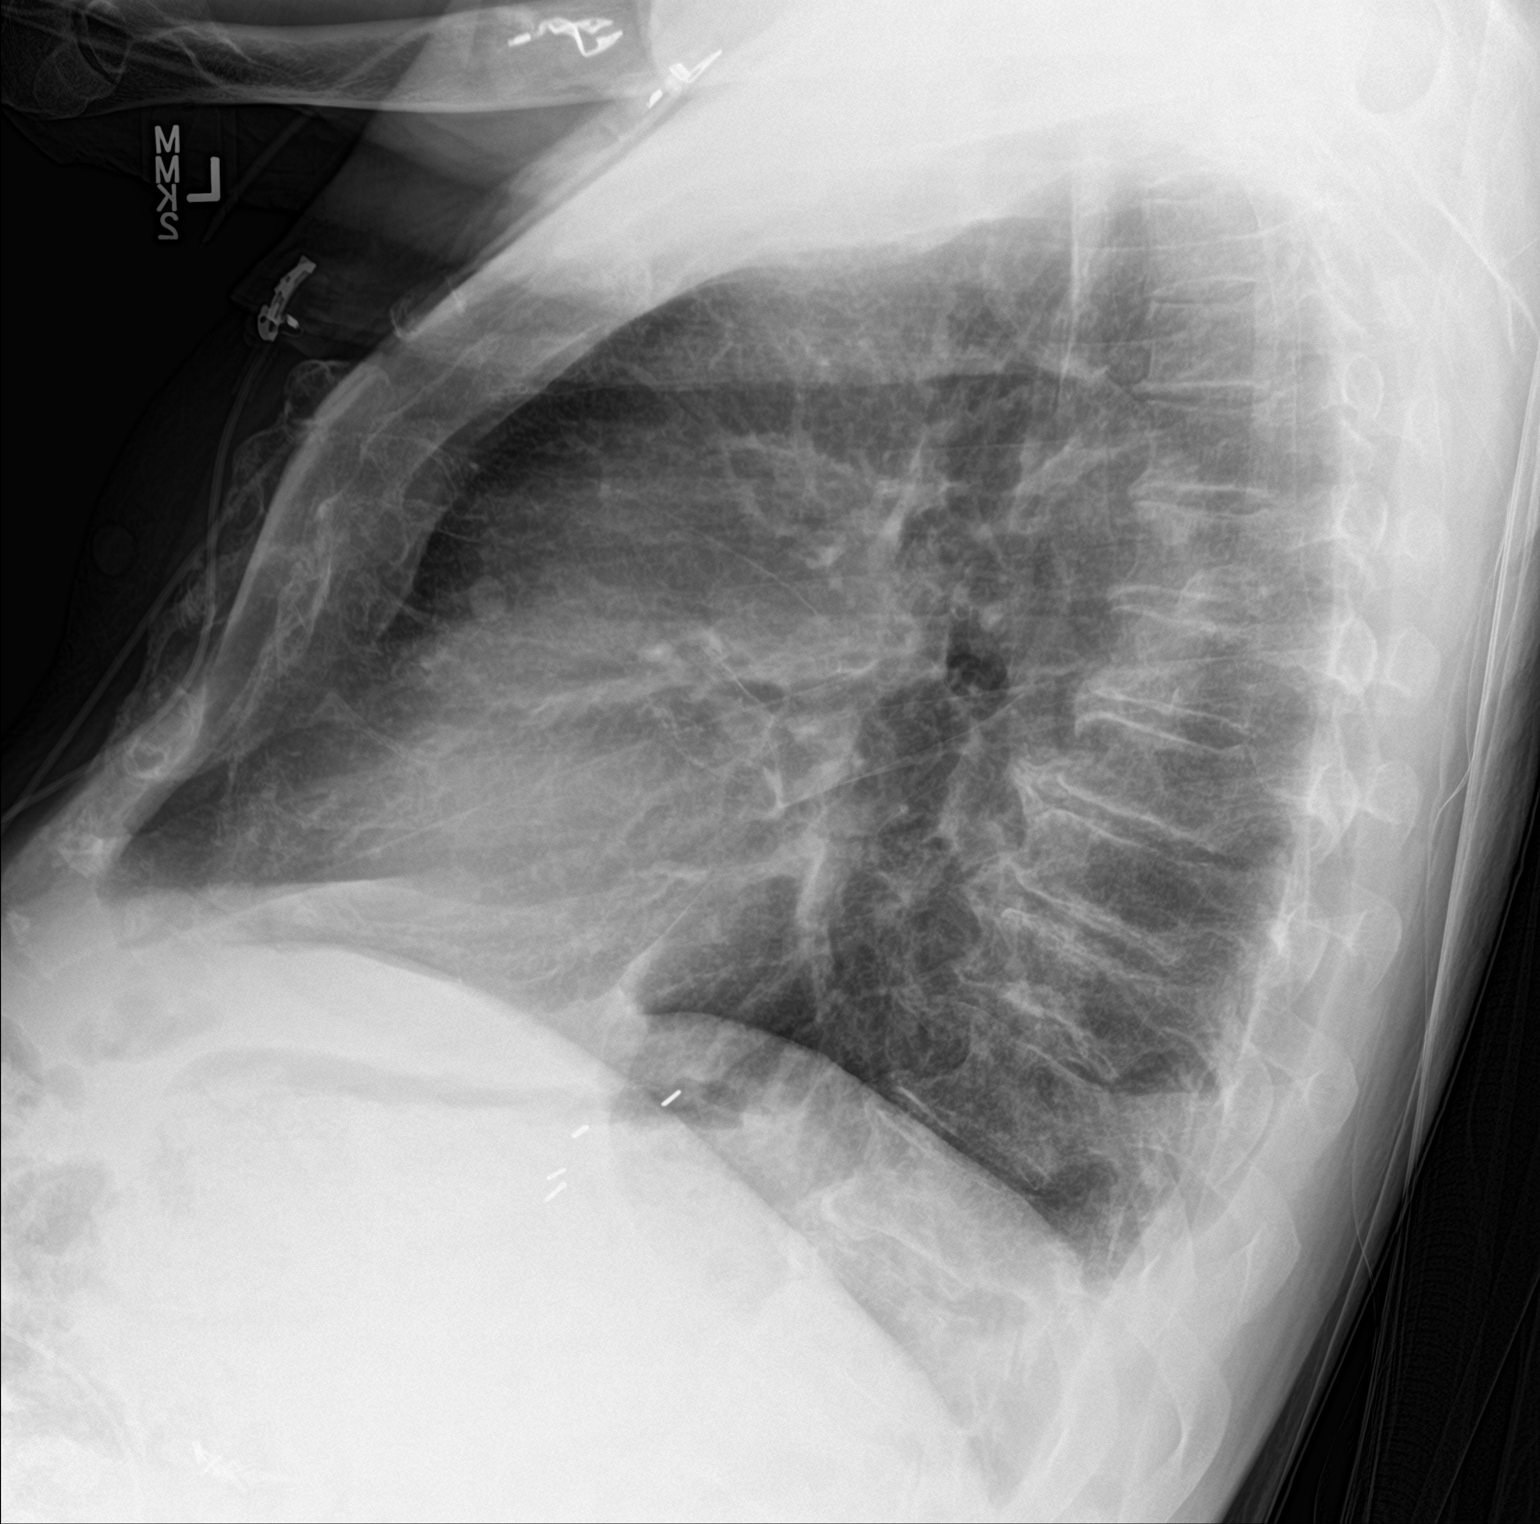

[chest ap]
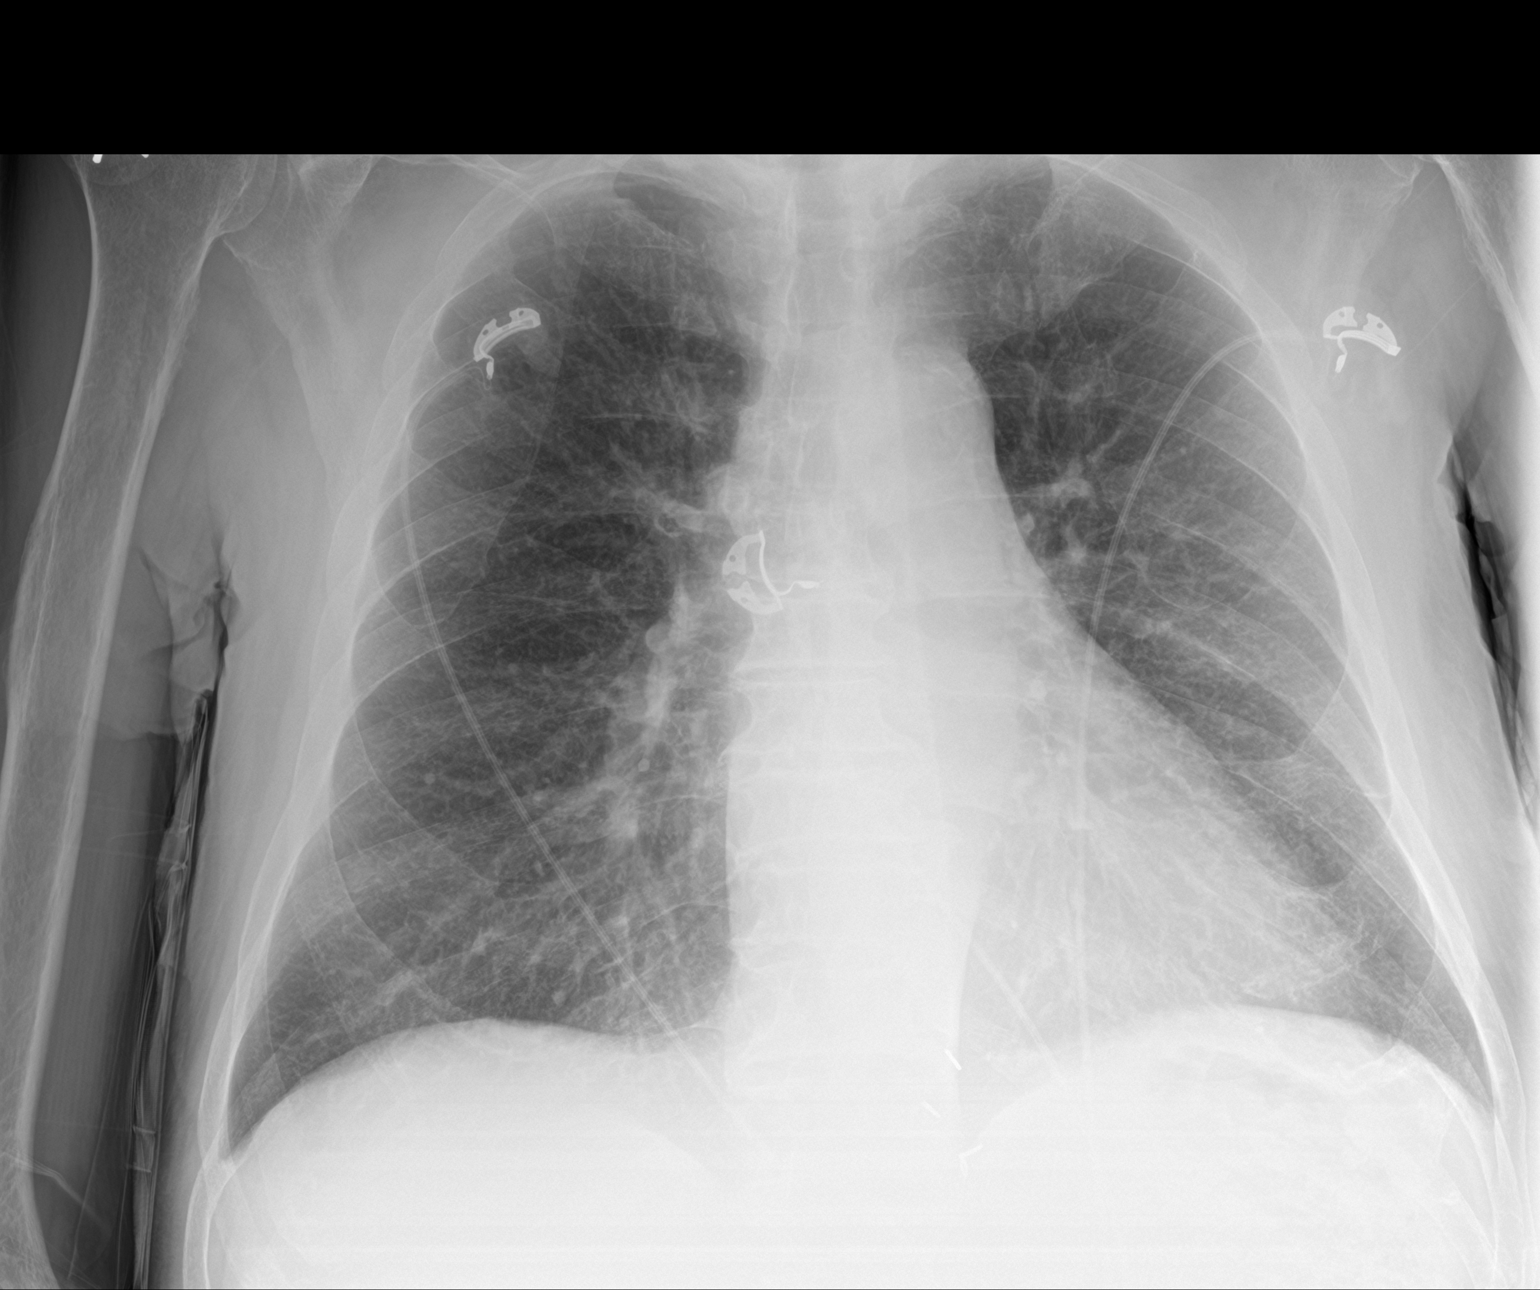

[2 of 2 positions shown; findings below may reference images not displayed]

FINDINGS: The heart size and mediastinal contours are within normal limits.
Both lungs are clear. The visualized skeletal structures are
unremarkable.
IMPRESSION: No active cardiopulmonary disease.

## 2019-07-17 ENCOUNTER — Telehealth: Payer: Self-pay | Admitting: Cardiology

## 2019-07-17 NOTE — Telephone Encounter (Signed)
Pt c/o medication issue:  1. Name of Medication: co-enzyme Q-10 Capsule   2. How are you currently taking this medication (dosage and times per day)? 50 mg   3. Are you having a reaction (difficulty breathing--STAT)? no  4. What is your medication issue? Daughter said she is having a hard time finding Co Q10 50 mg in the stores. She wanted to know if it was OK for the patient to increase the dosage to 100 mg. Please advise

## 2019-07-17 NOTE — Telephone Encounter (Signed)
LMTCB

## 2019-07-18 NOTE — Telephone Encounter (Signed)
Followed up w/ dtr. She tells me that she is having a hard time finding 50 mg co-Q10 and wondering if ok to increase to 100 mg (which seems to be easier to find). States that Dr. [name] told dad he could take it, years ago, for "extra energy". Informed that I don't see that it has been on his med list for awhile now. Dtr states that they haven't seen Dr [name] in many years, so thought Dr. Curt Bears would be best to advise if ok to take/increase. Aware I will have him review and let them know recommendation next week. dtr agreeable to plan.

## 2019-07-21 ENCOUNTER — Ambulatory Visit (INDEPENDENT_AMBULATORY_CARE_PROVIDER_SITE_OTHER): Payer: Medicare Other | Admitting: *Deleted

## 2019-07-21 DIAGNOSIS — I4891 Unspecified atrial fibrillation: Secondary | ICD-10-CM

## 2019-07-21 LAB — CUP PACEART REMOTE DEVICE CHECK
Battery Remaining Longevity: 100 mo
Battery Remaining Percentage: 95.5 %
Battery Voltage: 3.01 V
Brady Statistic AP VP Percent: 4.1 %
Brady Statistic AP VS Percent: 3.2 %
Brady Statistic AS VP Percent: 9.3 %
Brady Statistic AS VS Percent: 81 %
Brady Statistic RA Percent Paced: 1 %
Brady Statistic RV Percent Paced: 14 %
Date Time Interrogation Session: 20210426020015
Implantable Lead Implant Date: 20200727
Implantable Lead Implant Date: 20200727
Implantable Lead Location: 753859
Implantable Lead Location: 753860
Implantable Lead Model: 5076
Implantable Lead Model: 5076
Implantable Pulse Generator Implant Date: 20200727
Lead Channel Impedance Value: 400 Ohm
Lead Channel Impedance Value: 530 Ohm
Lead Channel Pacing Threshold Amplitude: 0.75 V
Lead Channel Pacing Threshold Pulse Width: 0.5 ms
Lead Channel Sensing Intrinsic Amplitude: 12 mV
Lead Channel Sensing Intrinsic Amplitude: 4.9 mV
Lead Channel Setting Pacing Amplitude: 2.5 V
Lead Channel Setting Pacing Amplitude: 3.5 V
Lead Channel Setting Pacing Pulse Width: 0.5 ms
Lead Channel Setting Sensing Sensitivity: 2 mV
Pulse Gen Model: 2272
Pulse Gen Serial Number: 9154154

## 2019-07-21 NOTE — Progress Notes (Signed)
PPM Remote  

## 2019-07-22 DIAGNOSIS — R338 Other retention of urine: Secondary | ICD-10-CM | POA: Diagnosis not present

## 2019-07-23 NOTE — Telephone Encounter (Signed)
Left detailed message informing  dtr ok for pt to take co-Q10 to 100 mg daily. Advised to call office back if need to further discuss.

## 2019-07-24 ENCOUNTER — Encounter: Payer: Self-pay | Admitting: Internal Medicine

## 2019-07-24 ENCOUNTER — Telehealth (INDEPENDENT_AMBULATORY_CARE_PROVIDER_SITE_OTHER): Payer: Medicare Other | Admitting: Internal Medicine

## 2019-07-24 DIAGNOSIS — E1142 Type 2 diabetes mellitus with diabetic polyneuropathy: Secondary | ICD-10-CM

## 2019-07-24 NOTE — Progress Notes (Signed)
Virtual Visit via Telephone Note  I connected with [name], on 07/24/2019 at 8:34 AM by telephone due to the COVID-19 pandemic and verified that I am speaking with the correct person using two identifiers.   Consent: I discussed the limitations, risks, security and privacy concerns of performing an evaluation and management service by telephone and the availability of in person appointments. I also discussed with the patient that there may be a patient responsible charge related to this service. The patient expressed understanding and agreed to proceed.   Location of Patient: Home   Location of Provider: Clinic    Persons participating in Telemedicine visit: Keymarion Jaimie [place] [facility] Dr. Juleen China      History of Present Illness: Diabetes mellitus, Type 2 Disease Monitoring             Blood Sugar Ranges: Fasting - 150s-200s              Polyuria: No              Visual problems: no   Urine Microalbumin 127 (July 2020)   Last A1C: 6.4 (Jan 2021)   Medications: Amaryl 2 mg daily --reports he increased back to the whole tablet  Medication Compliance: yes  Medication Side Effects             Hypoglycemia: yes, will drop during the daytime     Past Medical History:  Diagnosis Date  . Allergic rhinitis   . Anemia    takes Ferrous Sulfate daily  . Arthritis    "all over"  . Atrial flutter ([place])   . Balance problem 01/2014  . CAP (community acquired pneumonia) 09/18/2014  . Cervical radiculopathy due to degenerative joint disease of spine   . Chronic coronary artery disease    a. 03/2010 Nonocclusive disease by cath, performed for ST elevations on ECG;  b. 06/2013 Lexi MV: EF 60%, no ischemia.  [name] COPD (chronic obstructive pulmonary disease) ([place])   . Diabetes mellitus type II    takes Metformin and Lantus daily  . Diastolic CHF, chronic ([place])    a. 12/2012 EF 55-60%, diast dysfxn, triv MR, mildly dil LA/RA.  [name] Dyspnea    with pain  . Essential hypertension     takes Diltiazem daily  . Frail elderly   . GERD (gastroesophageal reflux disease)   . History of blood transfusion 1982   "when I had stomach OR"  . History of bronchitis    1998  . History of CVA (cerebrovascular accident)   . History of gastric ulcer   . History of kidney stones   . Hypercholesterolemia    takes Pravastatin daily  . Intrinsic eczema   . Legally blind in left eye, as defined in [place]   . Obesity   . PAF (paroxysmal atrial fibrillation) (HCC)    Recurrent after atrial flutter (a. 07/2010 Status post caval tricuspid isthmus ablation by Dr. Midge Aver Metoprolol daily), currently controlled on flecainide plus diltiazem and Coumadin  . Urinary retention   . Vitamin D deficiency   . Weakness    numbness and tingling both hands   Allergies  Allergen Reactions  . Cyclobenzaprine Other (See Comments)    Unsteady gait  . Other Other (See Comments)    Steroids speeds his heart rate    Current Outpatient Medications on File Prior to Visit  Medication Sig Dispense Refill  . Accu-Chek FastClix Lancets MISC USE TO TEST BLOOD SUGAR TWICE DAILY. E11.00 102 each 0  .  acetaminophen (TYLENOL) 650 MG CR tablet Take 650 mg by mouth every 8 (eight) hours as needed for pain.    [name] aspirin EC 81 MG tablet Take 81 mg by mouth daily.    . citalopram (CELEXA) 20 MG tablet Take 1 tablet (20 mg total) by mouth daily. 90 tablet 1  . diclofenac Sodium (VOLTAREN) 1 % GEL Apply 2 g topically 4 (four) times daily. (Patient not taking: Reported on 06/05/2019) 100 g 4  . diltiazem (CARDIZEM CD) 120 MG 24 hr capsule Take 1 capsule (120 mg total) by mouth 2 (two) times daily. 60 capsule 9  . ferrous sulfate 325 (65 FE) MG tablet Take 325 mg by mouth daily with breakfast.    . gabapentin (NEURONTIN) 100 MG capsule Take 1 capsule (100 mg total) by mouth at bedtime. 90 capsule 3  . glimepiride (AMARYL) 2 MG tablet Take 0.5 tablets (1 mg total) by mouth daily before breakfast. (Patient taking  differently: Take 2 mg by mouth daily before breakfast. ) 90 tablet 1  . glucose blood (ACCU-CHEK GUIDE) test strip USE TO TEST BLOOD SUGAR TWICE DAILY. E11.00 100 each 0  . nitroGLYCERIN (NITROSTAT) 0.4 MG SL tablet Place 1 tablet (0.4 mg total) under the tongue every 5 (five) minutes x 3 doses as needed for chest pain. 25 tablet 9  . pantoprazole (PROTONIX) 40 MG tablet Take 1 tablet (40 mg total) by mouth 2 (two) times daily before a meal. Dx: K21.9 180 tablet 0  . pravastatin (PRAVACHOL) 40 MG tablet Take 1 tablet (40 mg total) by mouth every evening. 90 tablet 0  . tamsulosin (FLOMAX) 0.4 MG CAPS capsule Take 1 capsule (0.4 mg total) by mouth daily. 30 capsule 3   No current facility-administered medications on file prior to visit.    Observations/Objective: NAD. Speaking clearly.  Work of breathing normal.  Alert and oriented. Mood appropriate.   Assessment and Plan: 1. Type 2 diabetes mellitus with diabetic polyneuropathy, without long-term current use of insulin (HCC) Last A1c below goal with result of 6.4%. He was previously instructed to decrease Amaryl to 1/2 tablet. He increased again due to fasting hyperglycemia. However, given that he is experiencing hypoglycemic episodes during the day and is above 65, would like him to have less tight glycemic control. Discussed this with the patient and encouraged to go back to 1/2 tablet daily. If A1c returns <7, would ask patient to d/c medication completely.  - Hemoglobin A1c; Future - Ambulatory referral to Ophthalmology   Follow Up Instructions: Lab visit 5/12, 3 month f/u for chronic medical conditions    I discussed the assessment and treatment plan with the patient. The patient was provided an opportunity to ask questions and all were answered. The patient agreed with the plan and demonstrated an understanding of the instructions.   The patient was advised to call back or seek an in-person evaluation if the symptoms worsen or if  the condition fails to improve as anticipated.     I provided 18 minutes total of non-face-to-face time during this encounter including median intraservice time, reviewing previous notes, investigations, ordering medications, medical decision making, coordinating care and patient verbalized understanding at the end of the visit.    Phill Myron, D.O. Primary Care at [facility]  07/24/2019, 8:34 AM

## 2019-07-26 ENCOUNTER — Emergency Department (HOSPITAL_COMMUNITY)
Admission: EM | Admit: 2019-07-26 | Discharge: 2019-07-26 | Disposition: A | Discharge status: Home / Self Care | Payer: Medicare Other | Source: Home / Self Care

## 2019-07-26 ENCOUNTER — Encounter (HOSPITAL_COMMUNITY): Payer: Self-pay | Admitting: Emergency Medicine

## 2019-07-26 DIAGNOSIS — I5032 Chronic diastolic (congestive) heart failure: Secondary | ICD-10-CM | POA: Diagnosis not present

## 2019-07-26 DIAGNOSIS — R1084 Generalized abdominal pain: Secondary | ICD-10-CM | POA: Diagnosis not present

## 2019-07-26 DIAGNOSIS — R0781 Pleurodynia: Secondary | ICD-10-CM | POA: Diagnosis not present

## 2019-07-26 DIAGNOSIS — D509 Iron deficiency anemia, unspecified: Secondary | ICD-10-CM | POA: Diagnosis not present

## 2019-07-26 DIAGNOSIS — Z20822 Contact with and (suspected) exposure to covid-19: Secondary | ICD-10-CM | POA: Diagnosis not present

## 2019-07-26 DIAGNOSIS — R531 Weakness: Secondary | ICD-10-CM | POA: Diagnosis not present

## 2019-07-26 DIAGNOSIS — Y732 Prosthetic and other implants, materials and accessory gastroenterology and urology devices associated with adverse incidents: Secondary | ICD-10-CM | POA: Insufficient documentation

## 2019-07-26 DIAGNOSIS — I959 Hypotension, unspecified: Secondary | ICD-10-CM | POA: Diagnosis not present

## 2019-07-26 DIAGNOSIS — N3001 Acute cystitis with hematuria: Secondary | ICD-10-CM | POA: Diagnosis not present

## 2019-07-26 DIAGNOSIS — I4821 Permanent atrial fibrillation: Secondary | ICD-10-CM | POA: Diagnosis not present

## 2019-07-26 DIAGNOSIS — R296 Repeated falls: Secondary | ICD-10-CM | POA: Diagnosis not present

## 2019-07-26 DIAGNOSIS — S199XXA Unspecified injury of neck, initial encounter: Secondary | ICD-10-CM | POA: Diagnosis not present

## 2019-07-26 DIAGNOSIS — S299XXA Unspecified injury of thorax, initial encounter: Secondary | ICD-10-CM | POA: Diagnosis not present

## 2019-07-26 DIAGNOSIS — T83098D Other mechanical complication of other indwelling urethral catheter, subsequent encounter: Secondary | ICD-10-CM | POA: Insufficient documentation

## 2019-07-26 DIAGNOSIS — Z7984 Long term (current) use of oral hypoglycemic drugs: Secondary | ICD-10-CM | POA: Diagnosis not present

## 2019-07-26 DIAGNOSIS — S79912A Unspecified injury of left hip, initial encounter: Secondary | ICD-10-CM | POA: Diagnosis not present

## 2019-07-26 DIAGNOSIS — Z5321 Procedure and treatment not carried out due to patient leaving prior to being seen by health care provider: Secondary | ICD-10-CM | POA: Insufficient documentation

## 2019-07-26 DIAGNOSIS — R300 Dysuria: Secondary | ICD-10-CM | POA: Diagnosis not present

## 2019-07-26 DIAGNOSIS — R1030 Lower abdominal pain, unspecified: Secondary | ICD-10-CM | POA: Diagnosis not present

## 2019-07-26 DIAGNOSIS — Z7982 Long term (current) use of aspirin: Secondary | ICD-10-CM | POA: Diagnosis not present

## 2019-07-26 DIAGNOSIS — S3991XA Unspecified injury of abdomen, initial encounter: Secondary | ICD-10-CM | POA: Diagnosis not present

## 2019-07-26 DIAGNOSIS — S0990XA Unspecified injury of head, initial encounter: Secondary | ICD-10-CM | POA: Diagnosis not present

## 2019-07-26 DIAGNOSIS — D649 Anemia, unspecified: Secondary | ICD-10-CM | POA: Diagnosis not present

## 2019-07-26 MED ORDER — SODIUM CHLORIDE 0.9% FLUSH: 3.0000 mL | Freq: Once | INTRAVENOUS | Status: DC

## 2019-07-26 NOTE — ED Triage Notes (Signed)
Per [facility] pt from home for leaking foley catheter. Reports had it changed last week. Pt having leg pains as well.

## 2019-07-28 ENCOUNTER — Other Ambulatory Visit: Payer: Self-pay

## 2019-07-28 ENCOUNTER — Emergency Department (HOSPITAL_COMMUNITY): Payer: Medicare Other

## 2019-07-28 ENCOUNTER — Encounter (HOSPITAL_COMMUNITY): Payer: Self-pay

## 2019-07-28 ENCOUNTER — Inpatient Hospital Stay (HOSPITAL_COMMUNITY)
Admission: EM | Admit: 2019-07-28 | Discharge: 2019-08-01 | DRG: 812 | Disposition: A | Discharge status: Skilled Nursing Facility | Payer: Medicare Other | Source: Skilled Nursing Facility | Attending: Internal Medicine | Admitting: Internal Medicine

## 2019-07-28 DIAGNOSIS — E119 Type 2 diabetes mellitus without complications: Secondary | ICD-10-CM

## 2019-07-28 DIAGNOSIS — N3001 Acute cystitis with hematuria: Secondary | ICD-10-CM

## 2019-07-28 DIAGNOSIS — S79912A Unspecified injury of left hip, initial encounter: Secondary | ICD-10-CM | POA: Diagnosis not present

## 2019-07-28 DIAGNOSIS — N401 Enlarged prostate with lower urinary tract symptoms: Secondary | ICD-10-CM | POA: Diagnosis present

## 2019-07-28 DIAGNOSIS — E876 Hypokalemia: Secondary | ICD-10-CM | POA: Diagnosis present

## 2019-07-28 DIAGNOSIS — I10 Essential (primary) hypertension: Secondary | ICD-10-CM | POA: Diagnosis present

## 2019-07-28 DIAGNOSIS — I4821 Permanent atrial fibrillation: Secondary | ICD-10-CM | POA: Diagnosis present

## 2019-07-28 DIAGNOSIS — R195 Other fecal abnormalities: Secondary | ICD-10-CM | POA: Diagnosis present

## 2019-07-28 DIAGNOSIS — I5032 Chronic diastolic (congestive) heart failure: Secondary | ICD-10-CM | POA: Diagnosis present

## 2019-07-28 DIAGNOSIS — Z8673 Personal history of transient ischemic attack (TIA), and cerebral infarction without residual deficits: Secondary | ICD-10-CM

## 2019-07-28 DIAGNOSIS — R296 Repeated falls: Secondary | ICD-10-CM | POA: Diagnosis not present

## 2019-07-28 DIAGNOSIS — S3991XA Unspecified injury of abdomen, initial encounter: Secondary | ICD-10-CM | POA: Diagnosis not present

## 2019-07-28 DIAGNOSIS — Z87891 Personal history of nicotine dependence: Secondary | ICD-10-CM

## 2019-07-28 DIAGNOSIS — J449 Chronic obstructive pulmonary disease, unspecified: Secondary | ICD-10-CM | POA: Diagnosis present

## 2019-07-28 DIAGNOSIS — I110 Hypertensive heart disease with heart failure: Secondary | ICD-10-CM | POA: Diagnosis present

## 2019-07-28 DIAGNOSIS — I2510 Atherosclerotic heart disease of native coronary artery without angina pectoris: Secondary | ICD-10-CM | POA: Diagnosis present

## 2019-07-28 DIAGNOSIS — D649 Anemia, unspecified: Secondary | ICD-10-CM | POA: Diagnosis present

## 2019-07-28 DIAGNOSIS — I4819 Other persistent atrial fibrillation: Secondary | ICD-10-CM | POA: Diagnosis present

## 2019-07-28 DIAGNOSIS — I4891 Unspecified atrial fibrillation: Secondary | ICD-10-CM | POA: Diagnosis not present

## 2019-07-28 DIAGNOSIS — R0781 Pleurodynia: Secondary | ICD-10-CM | POA: Diagnosis not present

## 2019-07-28 DIAGNOSIS — Z20822 Contact with and (suspected) exposure to covid-19: Secondary | ICD-10-CM | POA: Diagnosis present

## 2019-07-28 DIAGNOSIS — H548 Legal blindness, as defined in USA: Secondary | ICD-10-CM | POA: Diagnosis present

## 2019-07-28 DIAGNOSIS — W19XXXA Unspecified fall, initial encounter: Secondary | ICD-10-CM

## 2019-07-28 DIAGNOSIS — K219 Gastro-esophageal reflux disease without esophagitis: Secondary | ICD-10-CM | POA: Diagnosis present

## 2019-07-28 DIAGNOSIS — R4020 Unspecified coma: Secondary | ICD-10-CM | POA: Diagnosis not present

## 2019-07-28 DIAGNOSIS — R531 Weakness: Secondary | ICD-10-CM | POA: Diagnosis not present

## 2019-07-28 DIAGNOSIS — Z7982 Long term (current) use of aspirin: Secondary | ICD-10-CM

## 2019-07-28 DIAGNOSIS — S0990XA Unspecified injury of head, initial encounter: Secondary | ICD-10-CM | POA: Diagnosis not present

## 2019-07-28 DIAGNOSIS — F329 Major depressive disorder, single episode, unspecified: Secondary | ICD-10-CM | POA: Diagnosis present

## 2019-07-28 DIAGNOSIS — R338 Other retention of urine: Secondary | ICD-10-CM | POA: Diagnosis present

## 2019-07-28 DIAGNOSIS — K760 Fatty (change of) liver, not elsewhere classified: Secondary | ICD-10-CM | POA: Diagnosis present

## 2019-07-28 DIAGNOSIS — Z950 Presence of cardiac pacemaker: Secondary | ICD-10-CM

## 2019-07-28 DIAGNOSIS — Z7984 Long term (current) use of oral hypoglycemic drugs: Secondary | ICD-10-CM

## 2019-07-28 DIAGNOSIS — I959 Hypotension, unspecified: Secondary | ICD-10-CM | POA: Diagnosis present

## 2019-07-28 DIAGNOSIS — E1165 Type 2 diabetes mellitus with hyperglycemia: Secondary | ICD-10-CM | POA: Diagnosis present

## 2019-07-28 DIAGNOSIS — E782 Mixed hyperlipidemia: Secondary | ICD-10-CM | POA: Diagnosis present

## 2019-07-28 DIAGNOSIS — Z9049 Acquired absence of other specified parts of digestive tract: Secondary | ICD-10-CM

## 2019-07-28 DIAGNOSIS — F411 Generalized anxiety disorder: Secondary | ICD-10-CM | POA: Diagnosis present

## 2019-07-28 DIAGNOSIS — E559 Vitamin D deficiency, unspecified: Secondary | ICD-10-CM | POA: Diagnosis present

## 2019-07-28 DIAGNOSIS — E860 Dehydration: Secondary | ICD-10-CM | POA: Diagnosis present

## 2019-07-28 DIAGNOSIS — S199XXA Unspecified injury of neck, initial encounter: Secondary | ICD-10-CM | POA: Diagnosis not present

## 2019-07-28 DIAGNOSIS — S299XXA Unspecified injury of thorax, initial encounter: Secondary | ICD-10-CM | POA: Diagnosis not present

## 2019-07-28 DIAGNOSIS — Z79899 Other long term (current) drug therapy: Secondary | ICD-10-CM

## 2019-07-28 DIAGNOSIS — D509 Iron deficiency anemia, unspecified: Principal | ICD-10-CM | POA: Diagnosis present

## 2019-07-28 LAB — CBC WITH DIFFERENTIAL/PLATELET
Abs Immature Granulocytes: 0.02 10*3/uL (ref 0.00–0.07)
Basophils Absolute: 0.1 10*3/uL (ref 0.0–0.1)
Basophils Relative: 2 %
Eosinophils Absolute: 0.1 10*3/uL (ref 0.0–0.5)
Eosinophils Relative: 3 %
HCT: 24.5 % — ABNORMAL LOW (ref 39.0–52.0)
Hemoglobin: 7.6 g/dL — ABNORMAL LOW (ref 13.0–17.0)
Immature Granulocytes: 1 %
Lymphocytes Relative: 14 %
Lymphs Abs: 0.5 10*3/uL — ABNORMAL LOW (ref 0.7–4.0)
MCH: 26.2 pg (ref 26.0–34.0)
MCHC: 31 g/dL (ref 30.0–36.0)
MCV: 84.5 fL (ref 80.0–100.0)
Monocytes Absolute: 0.7 10*3/uL (ref 0.1–1.0)
Monocytes Relative: 20 %
Neutro Abs: 1.9 10*3/uL (ref 1.7–7.7)
Neutrophils Relative %: 60 %
Platelets: UNDETERMINED 10*3/uL (ref 150–400)
RBC: 2.9 MIL/uL — ABNORMAL LOW (ref 4.22–5.81)
RDW: 17.2 % — ABNORMAL HIGH (ref 11.5–15.5)
WBC: 3.2 10*3/uL — ABNORMAL LOW (ref 4.0–10.5)
nRBC: 0 % (ref 0.0–0.2)

## 2019-07-28 LAB — IRON AND TIBC
Iron: 19 ug/dL — ABNORMAL LOW (ref 45–182)
Saturation Ratios: 4 % — ABNORMAL LOW (ref 17.9–39.5)
TIBC: 473 ug/dL — ABNORMAL HIGH (ref 250–450)
UIBC: 454 ug/dL

## 2019-07-28 LAB — COMPREHENSIVE METABOLIC PANEL
ALT: 29 U/L (ref 0–44)
AST: 39 U/L (ref 15–41)
Albumin: 2.6 g/dL — ABNORMAL LOW (ref 3.5–5.0)
Alkaline Phosphatase: 138 U/L — ABNORMAL HIGH (ref 38–126)
Anion gap: 10 (ref 5–15)
BUN: 21 mg/dL (ref 8–23)
CO2: 21 mmol/L — ABNORMAL LOW (ref 22–32)
Calcium: 9.2 mg/dL (ref 8.9–10.3)
Chloride: 106 mmol/L (ref 98–111)
Creatinine, Ser: 1.38 mg/dL — ABNORMAL HIGH (ref 0.61–1.24)
GFR calc Af Amer: 56 mL/min — ABNORMAL LOW (ref >60)
GFR calc non Af Amer: 48 mL/min — ABNORMAL LOW (ref >60)
Glucose, Bld: 192 mg/dL — ABNORMAL HIGH (ref 70–99)
Potassium: 3.8 mmol/L (ref 3.5–5.1)
Sodium: 137 mmol/L (ref 135–145)
Total Bilirubin: 0.8 mg/dL (ref 0.3–1.2)
Total Protein: 8.9 g/dL — ABNORMAL HIGH (ref 6.5–8.1)

## 2019-07-28 LAB — FERRITIN: Ferritin: 12 ng/mL — ABNORMAL LOW (ref 24–336)

## 2019-07-28 LAB — URINALYSIS, ROUTINE W REFLEX MICROSCOPIC
Bilirubin Urine: NEGATIVE
Glucose, UA: NEGATIVE mg/dL
Ketones, ur: NEGATIVE mg/dL
Nitrite: POSITIVE — AB
Protein, ur: NEGATIVE mg/dL
Specific Gravity, Urine: 1.009 (ref 1.005–1.030)
pH: 5 (ref 5.0–8.0)

## 2019-07-28 LAB — RESPIRATORY PANEL BY RT PCR (FLU A&B, COVID)
Influenza A by PCR: NEGATIVE
Influenza B by PCR: NEGATIVE
SARS Coronavirus 2 by RT PCR: NEGATIVE

## 2019-07-28 LAB — LIPASE, BLOOD: Lipase: 36 U/L (ref 11–51)

## 2019-07-28 LAB — RETICULOCYTES
Immature Retic Fract: 33.4 % — ABNORMAL HIGH (ref 2.3–15.9)
RBC.: 3.14 MIL/uL — ABNORMAL LOW (ref 4.22–5.81)
Retic Count, Absolute: 66.3 10*3/uL (ref 19.0–186.0)
Retic Ct Pct: 2.1 % (ref 0.4–3.1)

## 2019-07-28 LAB — FOLATE: Folate: 21.8 ng/mL (ref >5.9)

## 2019-07-28 LAB — PREPARE RBC (CROSSMATCH)

## 2019-07-28 LAB — POC OCCULT BLOOD, ED: Fecal Occult Bld: POSITIVE — AB

## 2019-07-28 LAB — VITAMIN B12: Vitamin B-12: 718 pg/mL (ref 180–914)

## 2019-07-28 MED ORDER — SODIUM CHLORIDE 0.9% IV SOLUTION: Freq: Once | INTRAVENOUS | Status: DC

## 2019-07-28 MED ORDER — IOHEXOL 350 MG/ML SOLN
100.0000 mL | Freq: Once | INTRAVENOUS | Status: AC | PRN
  Administered 2019-07-28: 100 mL via INTRAVENOUS

## 2019-07-28 NOTE — H&P (Signed)
History and Physical    [name] [id number] DOB: 18-Sep-1940 DOA: 07/28/2019  PCP: Nicolette Bang, DO  Patient coming from: Home.  I have personally briefly reviewed patient's old medical records in [place]  Chief Complaint: Weakness.  HPI: [name] is a 79 y.o. male with medical history significant of allergic rhinitis, anemia, osteoarthritis, atrial flutter/atrial fibrillation, history of pneumonia, cervical radiculopathy, chronic diastolic CHF, CAD, essential hypertension, COPD, type 2 diabetes, GERD, history of all other nonhemorrhagic CVA, history of PUD with blood transfusion, history of urolithiasis, hyperlipidemia, intrinsic eczema, legally blind on left, history of obesity in the past, current BMI 27.44 kg/m,  Urinary retention, vitamin D deficiency, history of weakness who is coming to the emergency department with complaints of progressively worse weakness associated with multiple falls yesterday and today.  He mention feeling chest wall and rib pain, but also stated that his knees and hips are painful as well.  He has also been having some dyspnea with cough, occasionally productive of whitish sputum, progressively worse bilateral lower extremity edema.  He denies fever, chills, sore throat, rhinorrhea, wheezing or hemoptysis.  He denies nausea, emesis, but states he had diarrhea last week and was constipated for a few days several weeks ago.  No melena or hematochezia.  Has chronic indwelling catheter.  Denies suprapubic pain.  ED Course: Initial vital signs were 97.7 F, pulse 93, respiration 20, blood pressure 123/66 mmHg and O2 sat 100% on room air.  The patient was given a 1 unit PRBC transfusion.  Urinalysis showed positive nitrites, trace leukocyte esterase with rare bacteria.  CBC shows a white count of 3.2, hemoglobin 7.6 g/dL and platelets clumps were noted on smear.  About 5 weeks ago the patient's hemoglobin was 9.5 g/dL.  Fecal occult  blood was positive.  Anemia panel shows iron deficiency.  SARS and influenza PCR was negative.  CMP shows a CO2 of 21 mmol/L, all other electrolytes are normal.  Glucose 192, BUN 21 and creatinine 1.38 mg/dL.  Total protein is 8.9 and albumin 2.6 g/L.  AST, ALT and total bilirubin were normal.  Imaging: Left wrist series, abdominal x-ray and left hip x-ray did not show any acute abnormalities.  CT head showed moderate age-related atrophy and chronic microvascular ischemic changes, but no acute intracranial pathology.  C-spine CT was negative for cervical spine acute findings.  CT chest/abdomen did not show any acute intrathoracic, abdominal or pelvic traumatic injury.  There was fatty liver with suspicion for early cirrhosis.  There was mild stranding of the peripancreatic fat may be related to underlying liver disease.  Please see images and full radiology report for further detail.  Review of Systems: As per HPI otherwise all other systems reviewed and are negative.  Past Medical History:  Diagnosis Date  . Allergic rhinitis   . Anemia    takes Ferrous Sulfate daily  . Arthritis    "all over"  . Atrial flutter ([place])   . Balance problem 01/2014  . CAP (community acquired pneumonia) 09/18/2014  . Cervical radiculopathy due to degenerative joint disease of spine   . Chronic coronary artery disease    a. 03/2010 Nonocclusive disease by cath, performed for ST elevations on ECG;  b. 06/2013 Lexi MV: EF 60%, no ischemia.  [name] COPD (chronic obstructive pulmonary disease) ([place])   . Diabetes mellitus type II    takes Metformin and Lantus daily  . Diastolic CHF, chronic ([place])    a. 12/2012 EF 55-60%, diast dysfxn,  triv MR, mildly dil LA/RA.  [name] Dyspnea    with pain  . Essential hypertension    takes Diltiazem daily  . Frail elderly   . GERD (gastroesophageal reflux disease)   . History of blood transfusion 1982   "when I had stomach OR"  . History of bronchitis    1998  . History of CVA  (cerebrovascular accident)   . History of gastric ulcer   . History of kidney stones   . Hypercholesterolemia    takes Pravastatin daily  . Intrinsic eczema   . Legally blind in left eye, as defined in [place]   . Obesity   . PAF (paroxysmal atrial fibrillation) (HCC)    Recurrent after atrial flutter (a. 07/2010 Status post caval tricuspid isthmus ablation by Dr. Midge Aver Metoprolol daily), currently controlled on flecainide plus diltiazem and Coumadin  . Urinary retention   . Vitamin D deficiency   . Weakness    numbness and tingling both hands    Past Surgical History:  Procedure Laterality Date  . ATRIAL ABLATION SURGERY  08/05/10   CTI ablation for atrial flutter by JA  . CARDIAC CATHETERIZATION  2012   nl LV function, no occlusive CAD, PAF  . CARDIOVERSION  12/07/2010    Successful direct current cardioversion with atrial fibrillation to normal sinus rhythm  . CARPAL TUNNEL RELEASE Bilateral 01/30/2014   Procedure: BILATERAL CARPAL TUNNEL RELEASE;  Surgeon: Marianna Payment, MD;  Location: [place];  Service: Orthopedics;  Laterality: Bilateral;  . CATARACT EXTRACTION W/ INTRAOCULAR LENS  IMPLANT, BILATERAL Bilateral   . COLONOSCOPY [name] 12/02/2013   Procedure: COLONOSCOPY;  Surgeon: Irene Shipper, MD;  Location: [place];  Service: Endoscopy;  Laterality: [name];  . ESOPHAGOGASTRODUODENOSCOPY [name] 09/22/2014   Procedure: ESOPHAGOGASTRODUODENOSCOPY (EGD);  Surgeon: Ronald Lobo, MD;  Location: [facility]COPY;  Service: Endoscopy;  Laterality: [name];  . ESOPHAGOGASTRODUODENOSCOPY [name] 07/11/2016   Procedure: ESOPHAGOGASTRODUODENOSCOPY (EGD);  Surgeon: Doran Stabler, MD;  Location: [facility] ENDOSCOPY;  Service: Endoscopy;  Laterality: [name];  . EXTRACORPOREAL SHOCK WAVE LITHOTRIPSY Right 07/15/2018   Procedure: EXTRACORPOREAL SHOCK WAVE LITHOTRIPSY (ESWL);  Surgeon: Lucas Mallow, MD;  Location: [facility];  Service: Urology;  Laterality: Right;  . INCISION AND DRAINAGE ABSCESS / HEMATOMA OF  BURSA / KNEE / THIGH Left 1998   knee  . KNEE BURSECTOMY Left 1998  . LAPAROSCOPIC CHOLECYSTECTOMY  03/2010  . LEFT HEART CATH AND CORONARY ANGIOGRAPHY [name] 10/05/2017   Procedure: LEFT HEART CATH AND CORONARY ANGIOGRAPHY;  Surgeon: Martinique, Peter M, MD;  Location: [place] [facility];  Service: Cardiovascular;  Laterality: [name];  . NM MYOVIEW LTD  07/22/2013   Normal EF ~60%, no ischemia or infarction.  [name] PACEMAKER IMPLANT [name] 10/21/2018   Procedure: PACEMAKER IMPLANT;  Surgeon: Deboraha Sprang, MD;  Location: [place] [facility];  Service: Cardiovascular;  Laterality: [name];  . PARTIAL GASTRECTOMY  1982   subtotal; "took out 30% for ulcers"  . TRANSTHORACIC ECHOCARDIOGRAM  02/16/2014   EF 60%, no RWMA. - otherwise normal  . YAG LASER APPLICATION Bilateral     Social History  reports that he has quit smoking. His smoking use included cigarettes. He has a 14.25 pack-year smoking history. He has never used smokeless tobacco. He reports that he does not drink alcohol or use drugs.  Allergies  Allergen Reactions  . Cyclobenzaprine Other (See Comments)    Unsteady gait  . Other Other (See Comments)    Steroids speeds his heart  rate    Family History  Problem Relation Age of Onset  . Breast cancer Mother   . Diabetes Mother   . Kidney disease Mother   . Heart attack Father   . Heart disease Father   . Heart attack Brother   . Leukemia Daughter    Prior to Admission medications   Medication Sig Start Date End Date Taking? Authorizing Provider  aspirin EC 81 MG tablet Take 81 mg by mouth daily.   Yes [provider]  citalopram (CELEXA) 20 MG tablet Take 1 tablet (20 mg total) by mouth daily. 10/02/18  Yes Scot Jun, FNP  diltiazem (CARDIZEM CD) 120 MG 24 hr capsule Take 1 capsule (120 mg total) by mouth 2 (two) times daily. 04/07/19  Yes Camnitz, Will Hassell Done, MD  ferrous sulfate 325 (65 FE) MG tablet Take 325 mg by mouth daily with breakfast.   Yes [provider]    gabapentin (NEURONTIN) 100 MG capsule Take 1 capsule (100 mg total) by mouth at bedtime. 04/22/19  Yes Ladell Pier, MD  glimepiride (AMARYL) 2 MG tablet Take 0.5 tablets (1 mg total) by mouth daily before breakfast. 04/22/19  Yes Ladell Pier, MD  nitroGLYCERIN (NITROSTAT) 0.4 MG SL tablet Place 1 tablet (0.4 mg total) under the tongue every 5 (five) minutes x 3 doses as needed for chest pain. 11/19/15  Yes Kilroy, Luke K, PA-C  pantoprazole (PROTONIX) 40 MG tablet Take 1 tablet (40 mg total) by mouth 2 (two) times daily before a meal. Dx: K21.9 07/01/19  Yes Nicolette Bang, DO  pravastatin (PRAVACHOL) 40 MG tablet Take 1 tablet (40 mg total) by mouth every evening. 07/01/19  Yes Nicolette Bang, DO  tamsulosin (FLOMAX) 0.4 MG CAPS capsule Take 1 capsule (0.4 mg total) by mouth daily. 10/02/18  Yes Scot Jun, FNP  Accu-Chek FastClix Lancets MISC USE TO TEST BLOOD SUGAR TWICE DAILY. E11.00 12/04/18   Charlott Rakes, MD  glucose blood (ACCU-CHEK GUIDE) test strip USE TO TEST BLOOD SUGAR TWICE DAILY. E11.00 12/19/18   Charlott Rakes, MD    Physical Exam: Vitals:   07/28/19 2100 07/28/19 2115 07/28/19 2300 07/28/19 2315  BP: 134/79 138/71 133/71 127/66  Pulse: 94 94 98 84  Resp: 16 15 16 18   Temp:   98.3 F (36.8 C)   TempSrc:   Oral   SpO2: 98% 98% 98% 99%  Weight:      Height:        Constitutional: NAD, calm, comfortable Eyes: PERRL, lids and conjunctivae normal ENMT: Mucous membranes are moist. Posterior pharynx clear of any exudate or lesions. Neck: normal, supple, no masses, no thyromegaly Respiratory: Decreased breath sounds on bases, otherwise clear to auscultation bilaterally, no wheezing, no crackles. Normal respiratory effort. No accessory muscle use.  Cardiovascular: Regular rate and rhythm, no murmurs / rubs / gallops.  2+ bilateral lower extremity pitting edema. 2+ pedal pulses. No carotid bruits.  Abdomen: Nondistended.  BS positive.  Soft,  no tenderness, no masses palpated. No hepatosplenomegaly.   GU: Foley cath in place. Musculoskeletal: Generalized, nonfocal weakness.  No clubbing / cyanosis. Good ROM, no contractures. Normal muscle tone.  Skin: Some areas of ecchymosis on extremities, but no rashes, lesions, ulcers on limited dermatological examination. Neurologic: CN 2-12 grossly intact. Sensation intact, DTR normal.  Generalized nonfocal weakness. Psychiatric: Normal judgment and insight. Alert and oriented x 3. Normal mood.   Labs on Admission: I have personally reviewed following labs and imaging  studies  CBC: Recent Labs  Lab 07/28/19 1634  WBC 3.2*  NEUTROABS 1.9  HGB 7.6*  HCT 24.5*  MCV 84.5  PLT PLATELET CLUMPS NOTED ON SMEAR, UNABLE TO ESTIMATE    Basic Metabolic Panel: Recent Labs  Lab 07/28/19 1634  NA 137  K 3.8  CL 106  CO2 21*  GLUCOSE 192*  BUN 21  CREATININE 1.38*  CALCIUM 9.2    GFR: Estimated Creatinine Clearance: 41.7 mL/min (A) (by C-G formula based on SCr of 1.38 mg/dL (H)).  Liver Function Tests: Recent Labs  Lab 07/28/19 1634  AST 39  ALT 29  ALKPHOS 138*  BILITOT 0.8  PROT 8.9*  ALBUMIN 2.6*    Urine analysis:    Component Value Date/Time   COLORURINE YELLOW 07/28/2019 1634   APPEARANCEUR CLEAR 07/28/2019 1634   LABSPEC 1.009 07/28/2019 1634   PHURINE 5.0 07/28/2019 1634   GLUCOSEU NEGATIVE 07/28/2019 1634   HGBUR SMALL (A) 07/28/2019 1634   BILIRUBINUR NEGATIVE 07/28/2019 1634   BILIRUBINUR negative 10/02/2018 0915   KETONESUR NEGATIVE 07/28/2019 1634   PROTEINUR NEGATIVE 07/28/2019 1634   UROBILINOGEN 0.2 10/02/2018 0915   UROBILINOGEN 0.2 10/09/2014 2315   NITRITE POSITIVE (A) 07/28/2019 1634   LEUKOCYTESUR TRACE (A) 07/28/2019 1634    Radiological Exams on Admission: DG Ribs Unilateral W/Chest Left  Result Date: 07/28/2019 CLINICAL DATA:  Multiple recent falls.  Left hip, rib and chest pain EXAM: LEFT RIBS AND CHEST - 3+ VIEW COMPARISON:  None.  FINDINGS: No fracture or other bone lesions are seen involving the ribs. There is no evidence of pneumothorax or pleural effusion. Mild bilateral interstitial thickening similar in appearance to the prior exams. No focal consolidation, pleural effusion or pneumothorax. Heart size and mediastinal contours are within normal limits. Dual lead cardiac pacemaker. IMPRESSION: Negative. Electronically Signed   By: Kathreen Devoid   On: 07/28/2019 17:06   DG Abd 1 View  Result Date: 07/28/2019 CLINICAL DATA:  Multiple recent falls.  Left hip rib and chest pain EXAM: ABDOMEN - 1 VIEW COMPARISON:  None. FINDINGS: Prior cholecystectomy. Nonobstructive bowel gas pattern. No organomegaly or free air. Degenerative changes within the lumbar spine. No acute bony abnormality. IMPRESSION: No acute findings. Electronically Signed   By: Rolm Baptise M.D.   On: 07/28/2019 17:33   CT Head Wo Contrast  Result Date: 07/28/2019 CLINICAL DATA:  79 year old male with trauma. EXAM: CT HEAD WITHOUT CONTRAST CT CERVICAL SPINE WITHOUT CONTRAST TECHNIQUE: Multidetector CT imaging of the head and cervical spine was performed following the standard protocol without intravenous contrast. Multiplanar CT image reconstructions of the cervical spine were also generated. COMPARISON:  CT dated 11/14/2018. FINDINGS: CT HEAD FINDINGS Brain: Moderate age-related atrophy and chronic microvascular ischemic changes. There is no acute intracranial hemorrhage. No mass effect or midline shift. No extra-axial fluid collection. Vascular: No hyperdense vessel or unexpected calcification. Skull: Normal. Negative for fracture or focal lesion. Sinuses/Orbits: No acute finding. Other: None. CT CERVICAL SPINE FINDINGS Alignment: No acute subluxation. There is straightening of normal cervical lordosis which may be positional or due to muscle spasm or chronic. Skull base and vertebrae: No acute fracture. 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 There  has been interval increase in the distraction gap since the prior CT. Soft tissues and spinal canal: No prevertebral fluid or swelling. No visible canal hematoma. Disc levels:  Multilevel degenerative changes with bone spurring. Upper chest: Negative. Other: Partially visualized pacemaker wire. Bilateral carotid bulb calcified  plaques. Probable subcutaneous lipoma of the posterior neck. IMPRESSION: 1. No acute intracranial pathology. Moderate age-related atrophy and chronic microvascular ischemic changes. 2. No acute/traumatic cervical spine pathology. Old fracture of the dens with nonunion and increased distraction. Electronically Signed   By: Anner Crete M.D.   On: 07/28/2019 21:54   CT Chest W Contrast  Result Date: 07/28/2019 CLINICAL DATA:  79 year old male with trauma. Left-sided chest pain. EXAM: CT CHEST, ABDOMEN, AND PELVIS WITH CONTRAST TECHNIQUE: Multidetector CT imaging of the chest, abdomen and pelvis was performed following the standard protocol during bolus administration of intravenous contrast. CONTRAST:  118mL OMNIPAQUE IOHEXOL 350 MG/ML SOLN COMPARISON:  Chest radiograph dated 11/14/2018. FINDINGS: CT CHEST FINDINGS Cardiovascular: There is no cardiomegaly or pericardial effusion. There is 3 vessel coronary vascular calcification. Left-sided pacemaker. There is moderate atherosclerotic calcification of the thoracic aorta. No aneurysmal dilatation or dissection. The origins of the great vessels of the aortic arch appear patent as visualized. The central pulmonary arteries are grossly unremarkable. Mediastinum/Nodes: There is no hilar or mediastinal adenopathy. The esophagus and the thyroid gland are grossly unremarkable. No mediastinal fluid collection or hematoma. Lungs/Pleura: The lungs are clear. There is a 4 mm right middle lobe calcified granuloma. There is no pleural effusion or pneumothorax. The central airways are patent. Musculoskeletal: No acute osseous pathology. Degenerative  changes of the spine. CT ABDOMEN PELVIS FINDINGS No intra-abdominal free air. Small free fluid within the pelvis. Hepatobiliary: Fatty infiltration of the liver with morphologic changes of early cirrhosis. Cholecystectomy. Pancreas: Mild stranding of the peripancreatic fat may be related to underlying liver disease. Correlation with pancreatic enzymes recommended to exclude pancreatitis. Spleen: Normal in size without focal abnormality. Adrenals/Urinary Tract: The adrenal glands are unremarkable. There is no hydronephrosis on either side. There is symmetric enhancement and excretion of contrast by both kidneys. The visualized ureters appear unremarkable. The urinary bladder is decompressed around a Foley catheter. Two adjacent calcific foci with the larger measuring 4 mm along the bladder wall adjacent to the balloon of the Foley catheter may represent focal calcification of the bladder wall or stone within the bladder. Stomach/Bowel: Postsurgical changes of the gastroesophageal junction. There is postsurgical changes of the bowel. There is moderate stool throughout the colon. There are scattered colonic diverticula without active inflammatory changes. There is no bowel obstruction or active inflammation. The appendix is normal. Vascular/Lymphatic: Advanced aortoiliac atherosclerotic disease. There is a 2.5 cm infrarenal aortic ectasia. The IVC is unremarkable. No portal venous gas. There is no adenopathy. Reproductive: The prostate is suboptimally visualized. Other: Mild diffuse subcutaneous edema. Musculoskeletal: Osteopenia with degenerative changes of the spine. No acute osseous pathology. IMPRESSION: 1. No traumatic intrathoracic, abdominal, or pelvic pathology. 2. Fatty liver with morphologic changes of early cirrhosis. 3. Mild stranding of the peripancreatic fat may be related to underlying liver disease. Correlation with pancreatic enzymes recommended to exclude pancreatitis. 4. Colonic diverticulosis. No  bowel obstruction. Normal appendix. 5. Aortic Atherosclerosis (ICD10-I70.0). Electronically Signed   By: Anner Crete M.D.   On: 07/28/2019 22:19   CT Cervical Spine Wo Contrast  Result Date: 07/28/2019 CLINICAL DATA:  79 year old male with trauma. EXAM: CT HEAD WITHOUT CONTRAST CT CERVICAL SPINE WITHOUT CONTRAST TECHNIQUE: Multidetector CT imaging of the head and cervical spine was performed following the standard protocol without intravenous contrast. Multiplanar CT image reconstructions of the cervical spine were also generated. COMPARISON:  CT dated 11/14/2018. FINDINGS: CT HEAD FINDINGS Brain: Moderate age-related atrophy and chronic microvascular ischemic changes. There is no acute  intracranial hemorrhage. No mass effect or midline shift. No extra-axial fluid collection. Vascular: No hyperdense vessel or unexpected calcification. Skull: Normal. Negative for fracture or focal lesion. Sinuses/Orbits: No acute finding. Other: None. CT CERVICAL SPINE FINDINGS Alignment: No acute subluxation. There is straightening of normal cervical lordosis which may be positional or due to muscle spasm or chronic. Skull base and vertebrae: No acute fracture. 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 There has been interval increase in the distraction gap since the prior CT. Soft tissues and spinal canal: No prevertebral fluid or swelling. No visible canal hematoma. Disc levels:  Multilevel degenerative changes with bone spurring. Upper chest: Negative. Other: Partially visualized pacemaker wire. Bilateral carotid bulb calcified plaques. Probable subcutaneous lipoma of the posterior neck. IMPRESSION: 1. No acute intracranial pathology. Moderate age-related atrophy and chronic microvascular ischemic changes. 2. No acute/traumatic cervical spine pathology. Old fracture of the dens with nonunion and increased distraction. Electronically Signed   By: Anner Crete M.D.   On: 07/28/2019 21:54   CT  ABDOMEN PELVIS W CONTRAST  Result Date: 07/28/2019 CLINICAL DATA:  79 year old male with trauma. Left-sided chest pain. EXAM: CT CHEST, ABDOMEN, AND PELVIS WITH CONTRAST TECHNIQUE: Multidetector CT imaging of the chest, abdomen and pelvis was performed following the standard protocol during bolus administration of intravenous contrast. CONTRAST:  110mL OMNIPAQUE IOHEXOL 350 MG/ML SOLN COMPARISON:  Chest radiograph dated 11/14/2018. FINDINGS: CT CHEST FINDINGS Cardiovascular: There is no cardiomegaly or pericardial effusion. There is 3 vessel coronary vascular calcification. Left-sided pacemaker. There is moderate atherosclerotic calcification of the thoracic aorta. No aneurysmal dilatation or dissection. The origins of the great vessels of the aortic arch appear patent as visualized. The central pulmonary arteries are grossly unremarkable. Mediastinum/Nodes: There is no hilar or mediastinal adenopathy. The esophagus and the thyroid gland are grossly unremarkable. No mediastinal fluid collection or hematoma. Lungs/Pleura: The lungs are clear. There is a 4 mm right middle lobe calcified granuloma. There is no pleural effusion or pneumothorax. The central airways are patent. Musculoskeletal: No acute osseous pathology. Degenerative changes of the spine. CT ABDOMEN PELVIS FINDINGS No intra-abdominal free air. Small free fluid within the pelvis. Hepatobiliary: Fatty infiltration of the liver with morphologic changes of early cirrhosis. Cholecystectomy. Pancreas: Mild stranding of the peripancreatic fat may be related to underlying liver disease. Correlation with pancreatic enzymes recommended to exclude pancreatitis. Spleen: Normal in size without focal abnormality. Adrenals/Urinary Tract: The adrenal glands are unremarkable. There is no hydronephrosis on either side. There is symmetric enhancement and excretion of contrast by both kidneys. The visualized ureters appear unremarkable. The urinary bladder is decompressed  around a Foley catheter. Two adjacent calcific foci with the larger measuring 4 mm along the bladder wall adjacent to the balloon of the Foley catheter may represent focal calcification of the bladder wall or stone within the bladder. Stomach/Bowel: Postsurgical changes of the gastroesophageal junction. There is postsurgical changes of the bowel. There is moderate stool throughout the colon. There are scattered colonic diverticula without active inflammatory changes. There is no bowel obstruction or active inflammation. The appendix is normal. Vascular/Lymphatic: Advanced aortoiliac atherosclerotic disease. There is a 2.5 cm infrarenal aortic ectasia. The IVC is unremarkable. No portal venous gas. There is no adenopathy. Reproductive: The prostate is suboptimally visualized. Other: Mild diffuse subcutaneous edema. Musculoskeletal: Osteopenia with degenerative changes of the spine. No acute osseous pathology. IMPRESSION: 1. No traumatic intrathoracic, abdominal, or pelvic pathology. 2. Fatty liver with morphologic changes of early cirrhosis. 3. Mild  stranding of the peripancreatic fat may be related to underlying liver disease. Correlation with pancreatic enzymes recommended to exclude pancreatitis. 4. Colonic diverticulosis. No bowel obstruction. Normal appendix. 5. Aortic Atherosclerosis (ICD10-I70.0). Electronically Signed   By: Anner Crete M.D.   On: 07/28/2019 22:19   DG Hip Unilat W or Wo Pelvis 1 View Left  Result Date: 07/28/2019 CLINICAL DATA:  Fall, left hip pain EXAM: DG HIP (WITH OR WITHOUT PELVIS) 1V*L* COMPARISON:  04/15/2018 FINDINGS: Hip joints are symmetric. Joint spaces maintained. No acute bony abnormality. Specifically, no fracture, subluxation, or dislocation. IMPRESSION: No acute bony abnormality. Electronically Signed   By: Rolm Baptise M.D.   On: 07/28/2019 17:34    EKG: Independently reviewed. Vent. rate 86 BPM PR interval * ms QRS duration 90 ms QT/QTc 386/454 ms P-R-T axes *  -4 63 Atrial fibrillation Borderline ST elevation, inferior leads  Assessment/Plan Principal Problem:   Symptomatic iron deficiency anemia Observation/telemetry. Transfuse 1 unit of PRBC. Posttransfusion furosemide 20 mg IV. Continue iron supplementation. GI evaluation as in or outpatient.  Active Problems:   Heme-positive stools Follow-up H&H. In or outpatient GI evaluation.    Essential hypertension Continue Cardizem 120 mg p.o. twice daily. Monitor blood pressure and heart rate.    Persistent atrial fibrillation (HCC) CHA?DS?-VASc Score of 8. Not on anticoagulation. Continue Cardizem for rate control.    Mixed hyperlipidemia Continue pravastatin 40 mg p.o. daily.    Diabetes mellitus, type II ([place]) Carbohydrate modified diet. Continue Amaryl 1 mg p.o. daily. CBG monitoring with RI SS.    Diastolic CHF, chronic (HCC) No signs of decompensation. Posttransfusion furosemide 20 mg IVP.    COPD (chronic obstructive pulmonary disease) ([place]) Supplemental oxygen as needed. Bronchodilators as needed.    DVT prophylaxis: SCDs. Code Status:   Full code. Family Communication: Disposition Plan:   Patient is from:  Home  Anticipated DC to:  Home.  Anticipated DC date:  07/29/2019  Anticipated DC barriers: Clinical improvement. Consults called: Admission status:  Observation/telemetry.  Severity of Illness:  Moderate severity.  Reubin Milan MD [facility]  How to contact the [facility] Attending or Consulting provider [place] or covering provider during after hours [place], for this patient?   1. Check the care team in [facility] and look for a) attending/consulting [facility] provider listed and b) the [facility] team listed 2. Log into [email or web address] and use [facility] universal password to access. If you do not have the password, please contact the hospital operator. 3. Locate the [facility] provider you are looking for under [facility] and page to a number that you can be  directly reached. 4. If you still have difficulty reaching the provider, please page the [facility] (Director on Call) for the Hospitalists listed on amion for assistance.  07/28/2019, 11:22 PM   This document was prepared using Dragon voice recognition software and may contain some unintended transcription errors.

## 2019-07-28 NOTE — ED Provider Notes (Signed)
[facility] EMERGENCY DEPARTMENT Provider Note   CSN: [id number] Arrival date & time:        History No chief complaint on file.   [name] is a 79 y.o. male.  HPI HPI Comments: [name] is a 79 y.o. male with a significant medical history listed below who presents to the Emergency Department complaining of weakness.  Patient notes a history of UTI for which he took antibiotics for.  He has a chronic indwelling Foley catheter.  He denies any urinary symptoms but states for the past "month" he has felt generally weak and has experienced intermittent abdominal pain.  He denies any modifying factors for his abdominal pain and has difficulty specifying when it occurs, where it occurs and for how long.  He states due to his weakness he has been experiencing more falls recently.  He notes falling this morning and yesterday as well.  He states he fell on a tile floor.  He denies any head trauma with his falls.  He is on aspirin but denies any other blood thinning medications.  He denies URI symptoms, fevers, chills, chest pain, shortness of breath, urinary changes, syncope.     Past Medical History:  Diagnosis Date  . Allergic rhinitis   . Anemia    takes Ferrous Sulfate daily  . Arthritis    "all over"  . Atrial flutter ([place])   . Balance problem 01/2014  . CAP (community acquired pneumonia) 09/18/2014  . Cervical radiculopathy due to degenerative joint disease of spine   . Chronic coronary artery disease    a. 03/2010 Nonocclusive disease by cath, performed for ST elevations on ECG;  b. 06/2013 Lexi MV: EF 60%, no ischemia.  [name] COPD (chronic obstructive pulmonary disease) ([place])   . Diabetes mellitus type II    takes Metformin and Lantus daily  . Diastolic CHF, chronic ([place])    a. 12/2012 EF 55-60%, diast dysfxn, triv MR, mildly dil LA/RA.  [name] Dyspnea    with pain  . Essential hypertension    takes Diltiazem daily  . Frail elderly   . GERD  (gastroesophageal reflux disease)   . History of blood transfusion 1982   "when I had stomach OR"  . History of bronchitis    1998  . History of CVA (cerebrovascular accident)   . History of gastric ulcer   . History of kidney stones   . Hypercholesterolemia    takes Pravastatin daily  . Intrinsic eczema   . Legally blind in left eye, as defined in [place]   . Obesity   . PAF (paroxysmal atrial fibrillation) (HCC)    Recurrent after atrial flutter (a. 07/2010 Status post caval tricuspid isthmus ablation by Dr. Midge Aver Metoprolol daily), currently controlled on flecainide plus diltiazem and Coumadin  . Urinary retention   . Vitamin D deficiency   . Weakness    numbness and tingling both hands    Patient Active Problem List   Diagnosis Date Noted  . Cardiac pacemaker in situ 10/29/2018  . Major depressive disorder, recurrent severe without psychotic features ([place]) 04/15/2018  . Atrial fibrillation with RVR ([place]) 11/08/2017  . HTN (hypertension) 05/21/2017  . Diabetes mellitus type II 05/21/2017  . Diastolic CHF, chronic ([place]) 05/21/2017  . COPD (chronic obstructive pulmonary disease) ([place]) 05/21/2017  . H/O atrial flutter 05/21/2017  . Symptomatic anemia 07/10/2016  . Cardiac syncope 05/15/2015  . Cervical disc disease 05/15/2015  . Anemia 05/15/2015  . History of CVA (cerebrovascular  accident) 04/03/2014  . Benign neoplasm of rectum and anal canal 12/02/2013  . Tobacco use disorder 11/30/2013  . Mixed hyperlipidemia 06/06/2013  . Type 2 diabetes mellitus ([place]) 01/22/2013  . Obesity (BMI 30-39.9) 12/14/2012  . Persistent atrial fibrillation ([place]) 10/07/2010  . Atrial flutter - status post CTI ablation 07/29/2010  . Essential hypertension 07/29/2010  . Chronic diastolic congestive heart failure ([place]) 07/29/2010  . Atherosclerotic heart disease of native coronary artery with other forms of angina pectoris ([place]) 07/29/2010    Past Surgical History:  Procedure Laterality  Date  . ATRIAL ABLATION SURGERY  08/05/10   CTI ablation for atrial flutter by JA  . CARDIAC CATHETERIZATION  2012   nl LV function, no occlusive CAD, PAF  . CARDIOVERSION  12/07/2010    Successful direct current cardioversion with atrial fibrillation to normal sinus rhythm  . CARPAL TUNNEL RELEASE Bilateral 01/30/2014   Procedure: BILATERAL CARPAL TUNNEL RELEASE;  Surgeon: Marianna Payment, MD;  Location: [facility];  Service: Orthopedics;  Laterality: Bilateral;  . CATARACT EXTRACTION W/ INTRAOCULAR LENS  IMPLANT, BILATERAL Bilateral   . COLONOSCOPY [name] 12/02/2013   Procedure: COLONOSCOPY;  Surgeon: Irene Shipper, MD;  Location: [facility];  Service: Endoscopy;  Laterality: [name];  . ESOPHAGOGASTRODUODENOSCOPY [name] 09/22/2014   Procedure: ESOPHAGOGASTRODUODENOSCOPY (EGD);  Surgeon: Ronald Lobo, MD;  Location[facility] ENDOSCOPY;  Service: Endoscopy;  Laterality: [name];  . ESOPHAGOGASTRODUODENOSCOPY [name] 07/11/2016   Procedure: ESOPHAGOGASTRODUODENOSCOPY (EGD);  Surgeon: Doran Stabler, MD;  Location: [facility] ENDOSCOPY;  Service: Endoscopy;  Laterality: [name];  . EXTRACORPOREAL SHOCK WAVE LITHOTRIPSY Right 07/15/2018   Procedure: EXTRACORPOREAL SHOCK WAVE LITHOTRIPSY (ESWL);  Surgeon: Lucas Mallow, MD;  Location: [facility];  Service: Urology;  Laterality: Right;  . INCISION AND DRAINAGE ABSCESS / HEMATOMA OF BURSA / KNEE / THIGH Left 1998   knee  . KNEE BURSECTOMY Left 1998  . LAPAROSCOPIC CHOLECYSTECTOMY  03/2010  . LEFT HEART CATH AND CORONARY ANGIOGRAPHY [name] 10/05/2017   Procedure: LEFT HEART CATH AND CORONARY ANGIOGRAPHY;  Surgeon: Martinique, Peter M, MD;  Location: [facility] CV LAB;  Service: Cardiovascular;  Laterality: [name];  . NM MYOVIEW LTD  07/22/2013   Normal EF ~60%, no ischemia or infarction.  [name] PACEMAKER IMPLANT [name] 10/21/2018   Procedure: PACEMAKER IMPLANT;  Surgeon: Deboraha Sprang, MD;  Location: [facility];  Service: Cardiovascular;  Laterality: [name];  . PARTIAL GASTRECTOMY  1982    subtotal; "took out 30% for ulcers"  . TRANSTHORACIC ECHOCARDIOGRAM  02/16/2014   EF 60%, no RWMA. - otherwise normal  . YAG LASER APPLICATION Bilateral        Family History  Problem Relation Age of Onset  . Breast cancer Mother   . Diabetes Mother   . Kidney disease Mother   . Heart attack Father   . Heart disease Father   . Heart attack Brother   . Leukemia Daughter     Social History   Tobacco Use  . Smoking status: Former Smoker    Packs/day: 0.25    Years: 57.00    Pack years: 14.25    Types: Cigarettes  . Smokeless tobacco: Never Used  . Tobacco comment: He's been smoking between 0.5 and 2 ppd since age 57. QUIT 3 MONTHS AGO  Substance Use Topics  . Alcohol use: No    Alcohol/week: 0.0 standard drinks    Comment: "quit drinking in 1986"  . Drug use: No    Home Medications Prior to Admission  medications   Medication Sig Start Date End Date Taking? Authorizing Provider  Accu-Chek FastClix Lancets MISC USE TO TEST BLOOD SUGAR TWICE DAILY. E11.00 12/04/18   Charlott Rakes, MD  aspirin EC 81 MG tablet Take 81 mg by mouth daily.    [provider]  citalopram (CELEXA) 20 MG tablet Take 1 tablet (20 mg total) by mouth daily. 10/02/18   Scot Jun, FNP  diltiazem (CARDIZEM CD) 120 MG 24 hr capsule Take 1 capsule (120 mg total) by mouth 2 (two) times daily. 04/07/19   Camnitz, Ocie Doyne, MD  ferrous sulfate 325 (65 FE) MG tablet Take 325 mg by mouth daily with breakfast.    [provider]  gabapentin (NEURONTIN) 100 MG capsule Take 1 capsule (100 mg total) by mouth at bedtime. 04/22/19   Ladell Pier, MD  glimepiride (AMARYL) 2 MG tablet Take 0.5 tablets (1 mg total) by mouth daily before breakfast. Patient taking differently: Take 2 mg by mouth daily before breakfast.  04/22/19   Ladell Pier, MD  glucose blood (ACCU-CHEK GUIDE) test strip USE TO TEST BLOOD SUGAR TWICE DAILY. E11.00 12/19/18   Charlott Rakes, MD  nitroGLYCERIN  (NITROSTAT) 0.4 MG SL tablet Place 1 tablet (0.4 mg total) under the tongue every 5 (five) minutes x 3 doses as needed for chest pain. 11/19/15   Erlene Quan, PA-C  pantoprazole (PROTONIX) 40 MG tablet Take 1 tablet (40 mg total) by mouth 2 (two) times daily before a meal. Dx: K21.9 07/01/19   Nicolette Bang, DO  pravastatin (PRAVACHOL) 40 MG tablet Take 1 tablet (40 mg total) by mouth every evening. 07/01/19   Nicolette Bang, DO  tamsulosin (FLOMAX) 0.4 MG CAPS capsule Take 1 capsule (0.4 mg total) by mouth daily. 10/02/18   Scot Jun, FNP    Allergies    Cyclobenzaprine and Other  Review of Systems   Review of Systems  All other systems reviewed and are negative. Ten systems reviewed and are negative for acute change, except as noted in the HPI.   Physical Exam Updated Vital Signs BP 123/66 (BP Location: Left Arm)   Pulse 93   Temp 97.7 F (36.5 C) (Oral)   Resp 20   Ht '5\' 6"'$  (1.676 m)   Wt 77.1 kg   SpO2 100%   BMI 27.44 kg/m   Physical Exam Vitals and nursing note reviewed.  Constitutional:      General: He is in acute distress.     Appearance: Normal appearance. He is not ill-appearing, toxic-appearing or diaphoretic.     Comments: Cachectic elderly Caucasian male.  He lies in the semi-Fowlers position.  He speaks in complete sentences.  He rambles at times and has difficulty providing clear detail regarding his complaints.  HENT:     Head: Normocephalic and atraumatic.     Right Ear: External ear normal.     Left Ear: External ear normal.     Nose: Nose normal.     Mouth/Throat:     Mouth: Mucous membranes are moist.     Pharynx: Oropharynx is clear. No oropharyngeal exudate or posterior oropharyngeal erythema.  Eyes:     General: No scleral icterus.       Right eye: No discharge.        Left eye: No discharge.     Extraocular Movements: Extraocular movements intact.     Conjunctiva/sclera: Conjunctivae normal.     Pupils: Pupils are  equal, round, and reactive  to light.  Cardiovascular:     Rate and Rhythm: Normal rate and regular rhythm.     Pulses: Normal pulses.     Heart sounds: Normal heart sounds. No murmur. No friction rub. No gallop.   Pulmonary:     Effort: Pulmonary effort is normal. No respiratory distress.     Breath sounds: Normal breath sounds. No stridor. No wheezing, rhonchi or rales.  Abdominal:     General: Bowel sounds are normal.     Tenderness: There is abdominal tenderness. There is guarding. There is no rebound.     Hernia: A hernia is present.     Comments: Protuberant abdomen.  Well-healed vertical incision scar noted.  Incisional hernia noted superior to the umbilicus.  Diffuse TTP with voluntary guarding noted.  Genitourinary:    Comments: Foley catheter noted.  Yellow urine noted in the catheter bag. Musculoskeletal:        General: Normal range of motion.     Cervical back: Normal range of motion and neck supple. No tenderness.     Comments: TTP noted to the left lateral hip.  Diffuse TTP noted to the anterior chest wall and left ribs.  No overlying erythema or visible signs of trauma.  Skin:    General: Skin is warm and dry.  Neurological:     General: No focal deficit present.     Mental Status: He is alert and oriented to person, place, and time.  Psychiatric:        Mood and Affect: Mood normal.        Behavior: Behavior normal.    ED Results / Procedures / Treatments   Labs (all labs ordered are listed, but only abnormal results are displayed) Labs Reviewed  COMPREHENSIVE METABOLIC PANEL - Abnormal; Notable for the following components:      Result Value   CO2 21 (*)    Glucose, Bld 192 (*)    Creatinine, Ser 1.38 (*)    Total Protein 8.9 (*)    Albumin 2.6 (*)    Alkaline Phosphatase 138 (*)    GFR calc non Af Amer 48 (*)    GFR calc Af Amer 56 (*)    All other components within normal limits  CBC WITH DIFFERENTIAL/PLATELET - Abnormal; Notable for the following  components:   WBC 3.2 (*)    RBC 2.90 (*)    Hemoglobin 7.6 (*)    HCT 24.5 (*)    RDW 17.2 (*)    Lymphs Abs 0.5 (*)    All other components within normal limits  URINALYSIS, ROUTINE W REFLEX MICROSCOPIC - Abnormal; Notable for the following components:   Hgb urine dipstick SMALL (*)    Nitrite POSITIVE (*)    Leukocytes,Ua TRACE (*)    Bacteria, UA RARE (*)    All other components within normal limits  RETICULOCYTES - Abnormal; Notable for the following components:   RBC. 3.14 (*)    Immature Retic Fract 33.4 (*)    All other components within normal limits  POC OCCULT BLOOD, ED - Abnormal; Notable for the following components:   Fecal Occult Bld POSITIVE (*)    All other components within normal limits  URINE CULTURE  RESPIRATORY PANEL BY RT PCR (FLU A&B, COVID)  LIPASE, BLOOD  VITAMIN B12  IRON AND TIBC  FERRITIN  FOLATE  TYPE AND SCREEN    EKG EKG Interpretation  Date/Time:  Monday Jul 28 2019 16:29:12 EDT Ventricular Rate:  86 PR Interval:  QRS Duration: 90 QT Interval:  386 QTC Calculation: 532 R Axis:   -4 Text Interpretation: Atrial fibrillation Borderline ST elevation, inferior leads No significant change since last tracing Confirmed by Gareth Morgan [phone]) on 07/28/2019 5:42:47 PM   Radiology DG Ribs Unilateral W/Chest Left  Result Date: 07/28/2019 CLINICAL DATA:  Multiple recent falls.  Left hip, rib and chest pain EXAM: LEFT RIBS AND CHEST - 3+ VIEW COMPARISON:  None. FINDINGS: No fracture or other bone lesions are seen involving the ribs. There is no evidence of pneumothorax or pleural effusion. Mild bilateral interstitial thickening similar in appearance to the prior exams. No focal consolidation, pleural effusion or pneumothorax. Heart size and mediastinal contours are within normal limits. Dual lead cardiac pacemaker. IMPRESSION: Negative. Electronically Signed   By: Kathreen Devoid   On: 07/28/2019 17:06   DG Abd 1 View  Result Date:  07/28/2019 CLINICAL DATA:  Multiple recent falls.  Left hip rib and chest pain EXAM: ABDOMEN - 1 VIEW COMPARISON:  None. FINDINGS: Prior cholecystectomy. Nonobstructive bowel gas pattern. No organomegaly or free air. Degenerative changes within the lumbar spine. No acute bony abnormality. IMPRESSION: No acute findings. Electronically Signed   By: Rolm Baptise M.D.   On: 07/28/2019 17:33   DG Hip Unilat W or Wo Pelvis 1 View Left  Result Date: 07/28/2019 CLINICAL DATA:  Fall, left hip pain EXAM: DG HIP (WITH OR WITHOUT PELVIS) 1V*L* COMPARISON:  04/15/2018 FINDINGS: Hip joints are symmetric. Joint spaces maintained. No acute bony abnormality. Specifically, no fracture, subluxation, or dislocation. IMPRESSION: No acute bony abnormality. Electronically Signed   By: Rolm Baptise M.D.   On: 07/28/2019 17:34    Procedures Procedures   Medications Ordered in ED Medications - No data to display  ED Course  I have reviewed the triage vital signs and the nursing notes.  Pertinent labs & imaging results that were available during my care of the patient were reviewed by me and considered in my medical decision making (see chart for details).  MDM Rules/Calculators/A&P                      4:31 PM patient is an elderly 79 year old male who presents due to weakness and frequent falls.  Physical exam is significant for diffuse abdominal pain, left lateral hip pain, diffuse chest wall pain.  Patient is generally a poor historian.  Will obtain basic labs, imaging, will reassess.  8:01 PM basic labs show a creatinine of 1.38 which is similar to baseline values.  Alk phos is elevated at 138 which is also similar to baseline values.  Patient has a history of chronic anemia.  His hemoglobin has been decreasing for the past month from 10.1 and is 7.6 at today's visit.  I performed a Hemoccult test which was positive.  No frank hematochezia or melena visualized.  I discussed my patient with my attending physician Dr.  Gareth Morgan.  Decided to obtain a CT scan of his head, neck, chest, abdomen and pelvis.  Will order an anemia panel.  Will obtain COVID-19 testing.  Will likely discuss with hospitalist for possible admission.  9:18 PM it is the end of my shift and pt care will be transferred to my attending physician Dr. Gareth Morgan. Pt was discussed with Dr. Billy Fischer and he will likely me admitted to hospitalist.   Final Clinical Impression(s) / ED Diagnoses Final diagnoses:  Fall, initial encounter  Anemia, unspecified type  Acute cystitis with hematuria  Rx / DC Orders ED Discharge Orders    None       Rayna Sexton, Hershal Coria 07/28/19 2122    Gareth Morgan, MD 07/29/19 0010

## 2019-07-28 NOTE — ED Notes (Signed)
Patient transported to X-ray 

## 2019-07-28 NOTE — ED Triage Notes (Signed)
Pt BIB [facility] from home c/o weakness x 10-15 days. Pt states he just finished antibiotics for a UTI but has been weak for awhile. Pt states the weakness has caused him to have multiple falls. Pt states he fell twice yesterday and twice today. Pt is c/o of left chest and rib pain. Pt also states his right knee hurts but he's more worried about his chest.

## 2019-07-29 DIAGNOSIS — I25118 Atherosclerotic heart disease of native coronary artery with other forms of angina pectoris: Secondary | ICD-10-CM | POA: Diagnosis not present

## 2019-07-29 DIAGNOSIS — I110 Hypertensive heart disease with heart failure: Secondary | ICD-10-CM | POA: Diagnosis present

## 2019-07-29 DIAGNOSIS — Z7982 Long term (current) use of aspirin: Secondary | ICD-10-CM | POA: Diagnosis not present

## 2019-07-29 DIAGNOSIS — Z79899 Other long term (current) drug therapy: Secondary | ICD-10-CM | POA: Diagnosis not present

## 2019-07-29 DIAGNOSIS — M2550 Pain in unspecified joint: Secondary | ICD-10-CM | POA: Diagnosis not present

## 2019-07-29 DIAGNOSIS — J069 Acute upper respiratory infection, unspecified: Secondary | ICD-10-CM | POA: Diagnosis not present

## 2019-07-29 DIAGNOSIS — W19XXXA Unspecified fall, initial encounter: Secondary | ICD-10-CM | POA: Diagnosis not present

## 2019-07-29 DIAGNOSIS — R52 Pain, unspecified: Secondary | ICD-10-CM | POA: Diagnosis not present

## 2019-07-29 DIAGNOSIS — R338 Other retention of urine: Secondary | ICD-10-CM | POA: Diagnosis present

## 2019-07-29 DIAGNOSIS — Z7401 Bed confinement status: Secondary | ICD-10-CM | POA: Diagnosis not present

## 2019-07-29 DIAGNOSIS — I4819 Other persistent atrial fibrillation: Secondary | ICD-10-CM | POA: Diagnosis not present

## 2019-07-29 DIAGNOSIS — D649 Anemia, unspecified: Secondary | ICD-10-CM | POA: Diagnosis not present

## 2019-07-29 DIAGNOSIS — N3001 Acute cystitis with hematuria: Secondary | ICD-10-CM | POA: Diagnosis not present

## 2019-07-29 DIAGNOSIS — M6281 Muscle weakness (generalized): Secondary | ICD-10-CM | POA: Diagnosis not present

## 2019-07-29 DIAGNOSIS — E1165 Type 2 diabetes mellitus with hyperglycemia: Secondary | ICD-10-CM | POA: Diagnosis present

## 2019-07-29 DIAGNOSIS — D509 Iron deficiency anemia, unspecified: Secondary | ICD-10-CM | POA: Diagnosis present

## 2019-07-29 DIAGNOSIS — R2681 Unsteadiness on feet: Secondary | ICD-10-CM | POA: Diagnosis not present

## 2019-07-29 DIAGNOSIS — I4821 Permanent atrial fibrillation: Secondary | ICD-10-CM | POA: Diagnosis present

## 2019-07-29 DIAGNOSIS — R296 Repeated falls: Secondary | ICD-10-CM | POA: Diagnosis present

## 2019-07-29 DIAGNOSIS — Z20822 Contact with and (suspected) exposure to covid-19: Secondary | ICD-10-CM | POA: Diagnosis present

## 2019-07-29 DIAGNOSIS — J449 Chronic obstructive pulmonary disease, unspecified: Secondary | ICD-10-CM | POA: Diagnosis present

## 2019-07-29 DIAGNOSIS — E1140 Type 2 diabetes mellitus with diabetic neuropathy, unspecified: Secondary | ICD-10-CM | POA: Diagnosis not present

## 2019-07-29 DIAGNOSIS — N401 Enlarged prostate with lower urinary tract symptoms: Secondary | ICD-10-CM | POA: Diagnosis present

## 2019-07-29 DIAGNOSIS — I10 Essential (primary) hypertension: Secondary | ICD-10-CM | POA: Diagnosis not present

## 2019-07-29 DIAGNOSIS — E782 Mixed hyperlipidemia: Secondary | ICD-10-CM | POA: Diagnosis present

## 2019-07-29 DIAGNOSIS — R195 Other fecal abnormalities: Secondary | ICD-10-CM | POA: Diagnosis not present

## 2019-07-29 DIAGNOSIS — I5032 Chronic diastolic (congestive) heart failure: Secondary | ICD-10-CM | POA: Diagnosis present

## 2019-07-29 DIAGNOSIS — F411 Generalized anxiety disorder: Secondary | ICD-10-CM | POA: Diagnosis present

## 2019-07-29 DIAGNOSIS — K760 Fatty (change of) liver, not elsewhere classified: Secondary | ICD-10-CM | POA: Diagnosis present

## 2019-07-29 DIAGNOSIS — I2510 Atherosclerotic heart disease of native coronary artery without angina pectoris: Secondary | ICD-10-CM | POA: Diagnosis present

## 2019-07-29 DIAGNOSIS — R531 Weakness: Secondary | ICD-10-CM | POA: Diagnosis not present

## 2019-07-29 DIAGNOSIS — E876 Hypokalemia: Secondary | ICD-10-CM | POA: Diagnosis present

## 2019-07-29 DIAGNOSIS — Z7984 Long term (current) use of oral hypoglycemic drugs: Secondary | ICD-10-CM | POA: Diagnosis not present

## 2019-07-29 DIAGNOSIS — R55 Syncope and collapse: Secondary | ICD-10-CM | POA: Diagnosis not present

## 2019-07-29 DIAGNOSIS — Z87891 Personal history of nicotine dependence: Secondary | ICD-10-CM | POA: Diagnosis not present

## 2019-07-29 DIAGNOSIS — Z950 Presence of cardiac pacemaker: Secondary | ICD-10-CM | POA: Diagnosis not present

## 2019-07-29 DIAGNOSIS — H548 Legal blindness, as defined in USA: Secondary | ICD-10-CM | POA: Diagnosis present

## 2019-07-29 DIAGNOSIS — I69398 Other sequelae of cerebral infarction: Secondary | ICD-10-CM | POA: Diagnosis not present

## 2019-07-29 DIAGNOSIS — E559 Vitamin D deficiency, unspecified: Secondary | ICD-10-CM | POA: Diagnosis present

## 2019-07-29 DIAGNOSIS — I469 Cardiac arrest, cause unspecified: Secondary | ICD-10-CM | POA: Diagnosis not present

## 2019-07-29 DIAGNOSIS — R2689 Other abnormalities of gait and mobility: Secondary | ICD-10-CM | POA: Diagnosis not present

## 2019-07-29 DIAGNOSIS — F329 Major depressive disorder, single episode, unspecified: Secondary | ICD-10-CM | POA: Diagnosis present

## 2019-07-29 DIAGNOSIS — E860 Dehydration: Secondary | ICD-10-CM | POA: Diagnosis present

## 2019-07-29 DIAGNOSIS — Z9181 History of falling: Secondary | ICD-10-CM | POA: Diagnosis not present

## 2019-07-29 LAB — COMPREHENSIVE METABOLIC PANEL
ALT: 31 U/L (ref 0–44)
AST: 44 U/L — ABNORMAL HIGH (ref 15–41)
Albumin: 2.5 g/dL — ABNORMAL LOW (ref 3.5–5.0)
Alkaline Phosphatase: 133 U/L — ABNORMAL HIGH (ref 38–126)
Anion gap: 13 (ref 5–15)
BUN: 18 mg/dL (ref 8–23)
CO2: 20 mmol/L — ABNORMAL LOW (ref 22–32)
Calcium: 9.3 mg/dL (ref 8.9–10.3)
Chloride: 105 mmol/L (ref 98–111)
Creatinine, Ser: 1.33 mg/dL — ABNORMAL HIGH (ref 0.61–1.24)
GFR calc Af Amer: 58 mL/min — ABNORMAL LOW (ref >60)
GFR calc non Af Amer: 50 mL/min — ABNORMAL LOW (ref >60)
Glucose, Bld: 158 mg/dL — ABNORMAL HIGH (ref 70–99)
Potassium: 3.3 mmol/L — ABNORMAL LOW (ref 3.5–5.1)
Sodium: 138 mmol/L (ref 135–145)
Total Bilirubin: 1.3 mg/dL — ABNORMAL HIGH (ref 0.3–1.2)
Total Protein: 8.8 g/dL — ABNORMAL HIGH (ref 6.5–8.1)

## 2019-07-29 LAB — CBC
HCT: 30.2 % — ABNORMAL LOW (ref 39.0–52.0)
Hemoglobin: 9.2 g/dL — ABNORMAL LOW (ref 13.0–17.0)
MCH: 25.9 pg — ABNORMAL LOW (ref 26.0–34.0)
MCHC: 30.5 g/dL (ref 30.0–36.0)
MCV: 85.1 fL (ref 80.0–100.0)
Platelets: 122 10*3/uL — ABNORMAL LOW (ref 150–400)
RBC: 3.55 MIL/uL — ABNORMAL LOW (ref 4.22–5.81)
RDW: 16.7 % — ABNORMAL HIGH (ref 11.5–15.5)
WBC: 8 10*3/uL (ref 4.0–10.5)
nRBC: 0 % (ref 0.0–0.2)

## 2019-07-29 LAB — BPAM RBC
Blood Product Expiration Date: 202105112359
ISSUE DATE / TIME: 202105032302
Unit Type and Rh: 600

## 2019-07-29 LAB — TYPE AND SCREEN
ABO/RH(D): A NEG
Antibody Screen: NEGATIVE
Unit division: 0

## 2019-07-29 LAB — CBG MONITORING, ED: Glucose-Capillary: 176 mg/dL — ABNORMAL HIGH (ref 70–99)

## 2019-07-29 LAB — URINE CULTURE

## 2019-07-29 LAB — HEMOGLOBIN A1C
Hgb A1c MFr Bld: 8 % — ABNORMAL HIGH (ref 4.8–5.6)
Mean Plasma Glucose: 182.9 mg/dL

## 2019-07-29 LAB — MAGNESIUM: Magnesium: 1.7 mg/dL (ref 1.7–2.4)

## 2019-07-29 LAB — PHOSPHORUS: Phosphorus: 3 mg/dL (ref 2.5–4.6)

## 2019-07-29 LAB — GLUCOSE, CAPILLARY: Glucose-Capillary: 116 mg/dL — ABNORMAL HIGH (ref 70–99)

## 2019-07-29 MED ORDER — CITALOPRAM HYDROBROMIDE 20 MG PO TABS
20.0000 mg | ORAL_TABLET | Freq: Every day | ORAL | Status: DC
  Administered 2019-07-29 – 2019-08-01 (×4): 20 mg via ORAL
  Filled 2019-07-29: qty 2
  Filled 2019-07-29 (×3): qty 1

## 2019-07-29 MED ORDER — CHLORHEXIDINE GLUCONATE CLOTH 2 % EX PADS
6.0000 | MEDICATED_PAD | Freq: Every day | CUTANEOUS | Status: DC
  Administered 2019-07-29 – 2019-08-01 (×4): 6 via TOPICAL

## 2019-07-29 MED ORDER — ACETAMINOPHEN 650 MG RE SUPP: 650.0000 mg | Freq: Four times a day (QID) | RECTAL | Status: DC | PRN

## 2019-07-29 MED ORDER — FUROSEMIDE 10 MG/ML IJ SOLN
20.0000 mg | Freq: Once | INTRAMUSCULAR | Status: AC
  Administered 2019-07-29: 20 mg via INTRAVENOUS
  Filled 2019-07-29: qty 2

## 2019-07-29 MED ORDER — PANTOPRAZOLE SODIUM 40 MG PO TBEC
40.0000 mg | DELAYED_RELEASE_TABLET | Freq: Two times a day (BID) | ORAL | Status: DC
  Administered 2019-07-29 – 2019-08-01 (×7): 40 mg via ORAL
  Filled 2019-07-29 (×7): qty 1

## 2019-07-29 MED ORDER — SODIUM CHLORIDE 0.9 % IV SOLN: INTRAVENOUS | Status: AC

## 2019-07-29 MED ORDER — FERROUS SULFATE 325 (65 FE) MG PO TABS: 325.0000 mg | ORAL_TABLET | Freq: Every day | ORAL | Status: DC

## 2019-07-29 MED ORDER — MAGNESIUM SULFATE 2 GM/50ML IV SOLN
2.0000 g | Freq: Once | INTRAVENOUS | Status: AC
  Administered 2019-07-29: 2 g via INTRAVENOUS
  Filled 2019-07-29: qty 50

## 2019-07-29 MED ORDER — ONDANSETRON HCL 4 MG PO TABS: 4.0000 mg | ORAL_TABLET | Freq: Four times a day (QID) | ORAL | Status: DC | PRN

## 2019-07-29 MED ORDER — POTASSIUM CHLORIDE CRYS ER 20 MEQ PO TBCR
40.0000 meq | EXTENDED_RELEASE_TABLET | Freq: Two times a day (BID) | ORAL | Status: AC
  Administered 2019-07-29 (×2): 40 meq via ORAL
  Filled 2019-07-29 (×2): qty 2

## 2019-07-29 MED ORDER — AMOXICILLIN-POT CLAVULANATE 875-125 MG PO TABS
1.0000 | ORAL_TABLET | Freq: Two times a day (BID) | ORAL | Status: DC
  Administered 2019-07-29: 1 via ORAL
  Filled 2019-07-29: qty 1

## 2019-07-29 MED ORDER — NITROGLYCERIN 0.4 MG SL SUBL: 0.4000 mg | SUBLINGUAL_TABLET | SUBLINGUAL | Status: DC | PRN

## 2019-07-29 MED ORDER — ONDANSETRON HCL 4 MG/2ML IJ SOLN: 4.0000 mg | Freq: Four times a day (QID) | INTRAMUSCULAR | Status: DC | PRN

## 2019-07-29 MED ORDER — DILTIAZEM HCL ER COATED BEADS 120 MG PO CP24
120.0000 mg | ORAL_CAPSULE | Freq: Two times a day (BID) | ORAL | Status: DC
  Administered 2019-07-29 – 2019-08-01 (×7): 120 mg via ORAL
  Filled 2019-07-29 (×9): qty 1

## 2019-07-29 MED ORDER — PRAVASTATIN SODIUM 40 MG PO TABS
40.0000 mg | ORAL_TABLET | Freq: Every evening | ORAL | Status: DC
  Administered 2019-07-29 – 2019-07-31 (×3): 40 mg via ORAL
  Filled 2019-07-29 (×3): qty 1

## 2019-07-29 MED ORDER — GABAPENTIN 100 MG PO CAPS
100.0000 mg | ORAL_CAPSULE | Freq: Every day | ORAL | Status: DC
  Administered 2019-07-29 – 2019-07-31 (×4): 100 mg via ORAL
  Filled 2019-07-29 (×4): qty 1

## 2019-07-29 MED ORDER — TAMSULOSIN HCL 0.4 MG PO CAPS
0.4000 mg | ORAL_CAPSULE | Freq: Every day | ORAL | Status: DC
  Administered 2019-07-29 – 2019-08-01 (×4): 0.4 mg via ORAL
  Filled 2019-07-29 (×4): qty 1

## 2019-07-29 MED ORDER — ACETAMINOPHEN 325 MG PO TABS
650.0000 mg | ORAL_TABLET | Freq: Four times a day (QID) | ORAL | Status: DC | PRN
  Administered 2019-08-01: 325 mg via ORAL
  Filled 2019-07-29: qty 2

## 2019-07-29 MED ORDER — FERROUS SULFATE 325 (65 FE) MG PO TABS
325.0000 mg | ORAL_TABLET | Freq: Every day | ORAL | Status: DC
  Administered 2019-07-29 – 2019-08-01 (×4): 325 mg via ORAL
  Filled 2019-07-29 (×4): qty 1

## 2019-07-29 MED ORDER — GLIMEPIRIDE 1 MG PO TABS
1.0000 mg | ORAL_TABLET | Freq: Every day | ORAL | Status: DC
  Administered 2019-07-29 – 2019-08-01 (×4): 1 mg via ORAL
  Filled 2019-07-29 (×4): qty 1

## 2019-07-29 NOTE — Progress Notes (Signed)
Patient has arrived onto the unit from the ED. Patient is alert and oriented x 4. Patient has no complaints of shortness of breathe or chest pain. Tele monitor has been applied and CCMD has been notified.

## 2019-07-29 NOTE — Progress Notes (Signed)
PROGRESS NOTE  [name] [id number] DOB: 12/03/1940 DOA: 07/28/2019 PCP: Nicolette Bang, DO  HPI/Recap of past 24 hours: [name] is a 79 y.o. male with medical history significant of allergic rhinitis, chronic normocytic anemia, osteoarthritis, atrial flutter/atrial fibrillation not on oral anticoagulation, history of pneumonia, cervical radiculopathy, chronic diastolic CHF, CAD, essential hypertension, COPD, type 2 diabetes, GERD, nonhemorrhagic CVA, PUD with blood transfusion, urolithiasis, hyperlipidemia, legally blind on left,  Urinary retention, vitamin D deficiency, who presented to the emergency department with complaints of progressively worsening weakness associated symptoms include multiple falls, dyspnea, cough, and worsening bilateral lower extremity edema.   Has chronic indwelling catheter.  Denies suprapubic pain.  ED Course:  Hemoglobin 7.6 with positive FOBT.  Was given a 1 unit PRBC.  CT head showed moderate age-related atrophy and chronic microvascular ischemic changes, but no acute intracranial pathology.  C-spine CT was negative for cervical spine acute findings.  CT chest/abdomen did not show any acute intrathoracic, abdominal or pelvic traumatic injury.  There was fatty liver with suspicion for early cirrhosis.  There was mild stranding of the peripancreatic fat may be related to underlying liver disease.  Please see images and full radiology report for further detail.  07/29/19: Seen and examined in his room in the ED.  States he still feels very weak.   Assessment/Plan: Principal Problem:   Symptomatic anemia Active Problems:   Essential hypertension   Persistent atrial fibrillation (HCC)   Mixed hyperlipidemia   Heme positive stool   Diabetes mellitus, type II (HCC)   Diastolic CHF, chronic (HCC)   COPD (chronic obstructive pulmonary disease) (HCC)  Symptomatic iron deficiency anemia with positive FOBT Presented with dyspnea with  hemoglobin of 7.6 and positive FOBT, received 1 unit PRBC with improvement of his symptomatology Iron studies suggestive of iron deficiency Started on iron sulfate, continue CT abdomen and pelvis showed no acute pathology Can follow-up with GI outpatient Continue PPI twice daily Avoid NSAIDs  Hypotension with dehydration Maintain MAP greater than 65 Start normal saline at 50 cc per hour x 2 days Encourage oral intake  Permanent A. fib Rate is controlled Not on oral anticoagulation due to history of falls  Hypokalemia Potassium 3.3 Repleted with oral potassium 40 mg x 2 doses Added IV magnesium 2 g once Obtain BMP in the morning  Hypomagnesemia Magnesium 1.7 Repleted with 2 g IV magnesium Goal magnesium 2.0  Type 2 diabetes with hyperglycemia Hemoglobin A1c 8.0 Hold off oral hypoglycemics Continue insulin sliding scale  Presumed UTI, ruled out Presented with pyuria History of staff epidermidis UTI, was started on Augmentin Urine culture multiple species suggestive of recollection, DC antibiotics  Generalized anxiety/depression Stable Continue psych medications  Physical debility/generalized weakness PT OT to assess Fall precautions  Essential hypertension Blood pressure stable Resume home antihypertensives medication  Hyperlipidemia  Resume statin  BPH with indwelling Foley catheter Resume tamsulosin Monitor urine output  COPD Stable O2 saturation 100% on room air Bronchodilators as needed   DVT prophylaxis:      SCDs. Code Status:              Full code. Family Communication: Disposition Plan:              Patient is from:                        Home             Anticipated DC to:  SNF             Anticipated DC date:               07/31/2019             Anticipated DC barriers:         Clinical improvement.  Consults called: None.       Objective: Vitals:   07/29/19 0957 07/29/19 1300 07/29/19 1400 07/29/19 1500    BP: 106/62 108/68 113/61 107/68  Pulse: 92 93 94 84  Resp: (!) 21 17 12 14   Temp:      TempSrc:      SpO2: 97% 96% 98% 98%  Weight:      Height:        Intake/Output Summary (Last 24 hours) at 07/29/2019 1545 Last data filed at 07/29/2019 1536 Gross per 24 hour  Intake 315 ml  Output 1700 ml  Net -1385 ml   Filed Weights   07/28/19 1521  Weight: 77.1 kg    Exam:  . General: 79 y.o. year-old male well developed well nourished in no acute distress.  Alert and oriented x3. . Cardiovascular: Regular rate and rhythm with no rubs or gallops.  [name] Respiratory: Clear to auscultation with no wheezes or rales. . Abdomen: Soft nontender nondistended with normal bowel sounds. . Musculoskeletal: Trace lower extremity edema bilaterally. [name] Psychiatry: Mood is appropriate for condition and setting   Data Reviewed: CBC: Recent Labs  Lab 07/28/19 1634 07/29/19 0525  WBC 3.2* 8.0  NEUTROABS 1.9  --   HGB 7.6* 9.2*  HCT 24.5* 30.2*  MCV 84.5 85.1  PLT PLATELET CLUMPS NOTED ON SMEAR, UNABLE TO ESTIMATE [id number]*   Basic Metabolic Panel: Recent Labs  Lab 07/28/19 1634 07/29/19 0525  NA 137 138  K 3.8 3.3*  CL 106 105  CO2 21* 20*  GLUCOSE 192* 158*  BUN 21 18  CREATININE 1.38* 1.33*  CALCIUM 9.2 9.3  MG  --  1.7  PHOS  --  3.0   GFR: Estimated Creatinine Clearance: 43.3 mL/min (A) (by C-G formula based on SCr of 1.33 mg/dL (H)). Liver Function Tests: Recent Labs  Lab 07/28/19 1634 07/29/19 0525  AST 39 44*  ALT 29 31  ALKPHOS 138* 133*  BILITOT 0.8 1.3*  PROT 8.9* 8.8*  ALBUMIN 2.6* 2.5*   Recent Labs  Lab 07/28/19 1634  LIPASE 36   No results for input(s): AMMONIA in the last 168 hours. Coagulation Profile: No results for input(s): INR, PROTIME in the last 168 hours. Cardiac Enzymes: No results for input(s): CKTOTAL, CKMB, CKMBINDEX, TROPONINI in the last 168 hours. BNP (last 3 results) No results for input(s): PROBNP in the last 8760 hours. HbA1C: Recent  Labs    07/29/19 0525  HGBA1C 8.0*   CBG: Recent Labs  Lab 07/29/19 0741  GLUCAP 176*   Lipid Profile: No results for input(s): CHOL, HDL, LDLCALC, TRIG, CHOLHDL, LDLDIRECT in the last 72 hours. Thyroid Function Tests: No results for input(s): TSH, T4TOTAL, FREET4, T3FREE, THYROIDAB in the last 72 hours. Anemia Panel: Recent Labs    07/28/19 2020  VITAMINB12 718  FOLATE 21.8  FERRITIN 12*  TIBC 473*  IRON 19*  RETICCTPCT 2.1   Urine analysis:    Component Value Date/Time   COLORURINE YELLOW 07/28/2019 [place] 07/28/2019 1634   LABSPEC 1.009 07/28/2019 1634   PHURINE 5.0 07/28/2019 1634   GLUCOSEU NEGATIVE 07/28/2019 1634   HGBUR SMALL (A) 07/28/2019  [place] 07/28/2019 1634   BILIRUBINUR negative 10/02/2018 0915   KETONESUR NEGATIVE 07/28/2019 1634   PROTEINUR NEGATIVE 07/28/2019 1634   UROBILINOGEN 0.2 10/02/2018 0915   UROBILINOGEN 0.2 10/09/2014 2315   NITRITE POSITIVE (A) 07/28/2019 1634   LEUKOCYTESUR TRACE (A) 07/28/2019 1634   Sepsis Labs: @LABRCNTIP (procalcitonin:4,lacticidven:4)  ) Recent Results (from the past 240 hour(s))  Urine culture     Status: Abnormal   Collection Time: 07/28/19  4:34 PM   Specimen: Urine, Random  Result Value Ref Range Status   Specimen Description URINE, RANDOM  Final   Special Requests   Final    NONE Performed at [facility], [place] [address] [place], [place] [address]    Culture MULTIPLE SPECIES PRESENT, SUGGEST RECOLLECTION (A)  Final   Report Status 07/29/2019 FINAL  Final  Respiratory Panel by RT PCR (Flu A&B, Covid) - Nasopharyngeal Swab     Status: None   Collection Time: 07/28/19  8:20 PM   Specimen: Nasopharyngeal Swab  Result Value Ref Range Status   SARS Coronavirus 2 by RT PCR NEGATIVE NEGATIVE Final    Comment: (NOTE) SARS-CoV-2 target nucleic acids are NOT DETECTED. The SARS-CoV-2 RNA is generally detectable in upper respiratoy specimens during the acute  phase of infection. The lowest concentration of SARS-CoV-2 viral copies this assay can detect is 131 copies/mL. A negative result does not preclude SARS-Cov-2 infection and should not be used as the sole basis for treatment or other patient management decisions. A negative result may occur with  improper specimen collection/handling, submission of specimen other than nasopharyngeal swab, presence of viral mutation(s) within the areas targeted by this assay, and inadequate number of viral copies (<131 copies/mL). A negative result must be combined with clinical observations, patient history, and epidemiological information. The expected result is Negative. Fact Sheet for Patients:  PinkCheek.be Fact Sheet for Healthcare Providers:  GravelBags.it This test is not yet ap proved or cleared by the [place] FDA and  has been authorized for detection and/or diagnosis of SARS-CoV-2 by FDA under an Emergency Use Authorization (EUA). This EUA will remain  in effect (meaning this test can be used) for the duration of the COVID-19 declaration under Section 564(b)(1) of the Act, 21 U.S.C. section 360bbb-3(b)(1), unless the authorization is terminated or revoked sooner.    Influenza A by PCR NEGATIVE NEGATIVE Final   Influenza B by PCR NEGATIVE NEGATIVE Final    Comment: (NOTE) The Xpert Xpress SARS-CoV-2/FLU/RSV assay is intended as an aid in  the diagnosis of influenza from Nasopharyngeal swab specimens and  should not be used as a sole basis for treatment. Nasal washings and  aspirates are unacceptable for Xpert Xpress SARS-CoV-2/FLU/RSV  testing. Fact Sheet for Patients: PinkCheek.be Fact Sheet for Healthcare Providers: GravelBags.it This test is not yet approved or cleared by the [place] FDA and  has been authorized for detection and/or diagnosis of SARS-CoV-2 by   FDA under an Emergency Use Authorization (EUA). This EUA will remain  in effect (meaning this test can be used) for the duration of the  Covid-19 declaration under Section 564(b)(1) of the Act, 21  U.S.C. section 360bbb-3(b)(1), unless the authorization is  terminated or revoked. Performed at [facility], [place] [address] [place], [place] [address]       Studies: DG Ribs Unilateral W/Chest Left  Result Date: 07/28/2019 CLINICAL DATA:  Multiple recent falls.  Left hip, rib and chest pain EXAM: LEFT RIBS AND CHEST -  3+ VIEW COMPARISON:  None. FINDINGS: No fracture or other bone lesions are seen involving the ribs. There is no evidence of pneumothorax or pleural effusion. Mild bilateral interstitial thickening similar in appearance to the prior exams. No focal consolidation, pleural effusion or pneumothorax. Heart size and mediastinal contours are within normal limits. Dual lead cardiac pacemaker. IMPRESSION: Negative. Electronically Signed   By: Kathreen Devoid   On: 07/28/2019 17:06   DG Abd 1 View  Result Date: 07/28/2019 CLINICAL DATA:  Multiple recent falls.  Left hip rib and chest pain EXAM: ABDOMEN - 1 VIEW COMPARISON:  None. FINDINGS: Prior cholecystectomy. Nonobstructive bowel gas pattern. No organomegaly or free air. Degenerative changes within the lumbar spine. No acute bony abnormality. IMPRESSION: No acute findings. Electronically Signed   By: Rolm Baptise M.D.   On: 07/28/2019 17:33   CT Head Wo Contrast  Result Date: 07/28/2019 CLINICAL DATA:  79 year old male with trauma. EXAM: CT HEAD WITHOUT CONTRAST CT CERVICAL SPINE WITHOUT CONTRAST TECHNIQUE: Multidetector CT imaging of the head and cervical spine was performed following the standard protocol without intravenous contrast. Multiplanar CT image reconstructions of the cervical spine were also generated. COMPARISON:  CT dated 11/14/2018. FINDINGS: CT HEAD FINDINGS Brain: Moderate age-related atrophy and chronic microvascular  ischemic changes. There is no acute intracranial hemorrhage. No mass effect or midline shift. No extra-axial fluid collection. Vascular: No hyperdense vessel or unexpected calcification. Skull: Normal. Negative for fracture or focal lesion. Sinuses/Orbits: No acute finding. Other: None. CT CERVICAL SPINE FINDINGS Alignment: No acute subluxation. There is straightening of normal cervical lordosis which may be positional or due to muscle spasm or chronic. Skull base and vertebrae: No acute fracture. 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 There has been interval increase in the distraction gap since the prior CT. Soft tissues and spinal canal: No prevertebral fluid or swelling. No visible canal hematoma. Disc levels:  Multilevel degenerative changes with bone spurring. Upper chest: Negative. Other: Partially visualized pacemaker wire. Bilateral carotid bulb calcified plaques. Probable subcutaneous lipoma of the posterior neck. IMPRESSION: 1. No acute intracranial pathology. Moderate age-related atrophy and chronic microvascular ischemic changes. 2. No acute/traumatic cervical spine pathology. Old fracture of the dens with nonunion and increased distraction. Electronically Signed   By: Anner Crete M.D.   On: 07/28/2019 21:54   CT Chest W Contrast  Result Date: 07/28/2019 CLINICAL DATA:  79 year old male with trauma. Left-sided chest pain. EXAM: CT CHEST, ABDOMEN, AND PELVIS WITH CONTRAST TECHNIQUE: Multidetector CT imaging of the chest, abdomen and pelvis was performed following the standard protocol during bolus administration of intravenous contrast. CONTRAST:  169mL OMNIPAQUE IOHEXOL 350 MG/ML SOLN COMPARISON:  Chest radiograph dated 11/14/2018. FINDINGS: CT CHEST FINDINGS Cardiovascular: There is no cardiomegaly or pericardial effusion. There is 3 vessel coronary vascular calcification. Left-sided pacemaker. There is moderate atherosclerotic calcification of the thoracic aorta. No  aneurysmal dilatation or dissection. The origins of the great vessels of the aortic arch appear patent as visualized. The central pulmonary arteries are grossly unremarkable. Mediastinum/Nodes: There is no hilar or mediastinal adenopathy. The esophagus and the thyroid gland are grossly unremarkable. No mediastinal fluid collection or hematoma. Lungs/Pleura: The lungs are clear. There is a 4 mm right middle lobe calcified granuloma. There is no pleural effusion or pneumothorax. The central airways are patent. Musculoskeletal: No acute osseous pathology. Degenerative changes of the spine. CT ABDOMEN PELVIS FINDINGS No intra-abdominal free air. Small free fluid within the pelvis. Hepatobiliary: Fatty infiltration of the  liver with morphologic changes of early cirrhosis. Cholecystectomy. Pancreas: Mild stranding of the peripancreatic fat may be related to underlying liver disease. Correlation with pancreatic enzymes recommended to exclude pancreatitis. Spleen: Normal in size without focal abnormality. Adrenals/Urinary Tract: The adrenal glands are unremarkable. There is no hydronephrosis on either side. There is symmetric enhancement and excretion of contrast by both kidneys. The visualized ureters appear unremarkable. The urinary bladder is decompressed around a Foley catheter. Two adjacent calcific foci with the larger measuring 4 mm along the bladder wall adjacent to the balloon of the Foley catheter may represent focal calcification of the bladder wall or stone within the bladder. Stomach/Bowel: Postsurgical changes of the gastroesophageal junction. There is postsurgical changes of the bowel. There is moderate stool throughout the colon. There are scattered colonic diverticula without active inflammatory changes. There is no bowel obstruction or active inflammation. The appendix is normal. Vascular/Lymphatic: Advanced aortoiliac atherosclerotic disease. There is a 2.5 cm infrarenal aortic ectasia. The IVC is  unremarkable. No portal venous gas. There is no adenopathy. Reproductive: The prostate is suboptimally visualized. Other: Mild diffuse subcutaneous edema. Musculoskeletal: Osteopenia with degenerative changes of the spine. No acute osseous pathology. IMPRESSION: 1. No traumatic intrathoracic, abdominal, or pelvic pathology. 2. Fatty liver with morphologic changes of early cirrhosis. 3. Mild stranding of the peripancreatic fat may be related to underlying liver disease. Correlation with pancreatic enzymes recommended to exclude pancreatitis. 4. Colonic diverticulosis. No bowel obstruction. Normal appendix. 5. Aortic Atherosclerosis (ICD10-I70.0). Electronically Signed   By: Anner Crete M.D.   On: 07/28/2019 22:19   CT Cervical Spine Wo Contrast  Result Date: 07/28/2019 CLINICAL DATA:  79 year old male with trauma. EXAM: CT HEAD WITHOUT CONTRAST CT CERVICAL SPINE WITHOUT CONTRAST TECHNIQUE: Multidetector CT imaging of the head and cervical spine was performed following the standard protocol without intravenous contrast. Multiplanar CT image reconstructions of the cervical spine were also generated. COMPARISON:  CT dated 11/14/2018. FINDINGS: CT HEAD FINDINGS Brain: Moderate age-related atrophy and chronic microvascular ischemic changes. There is no acute intracranial hemorrhage. No mass effect or midline shift. No extra-axial fluid collection. Vascular: No hyperdense vessel or unexpected calcification. Skull: Normal. Negative for fracture or focal lesion. Sinuses/Orbits: No acute finding. Other: None. CT CERVICAL SPINE FINDINGS Alignment: No acute subluxation. There is straightening of normal cervical lordosis which may be positional or due to muscle spasm or chronic. Skull base and vertebrae: No acute fracture. 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 There has been interval increase in the distraction gap since the prior CT. Soft tissues and spinal canal: No prevertebral fluid or  swelling. No visible canal hematoma. Disc levels:  Multilevel degenerative changes with bone spurring. Upper chest: Negative. Other: Partially visualized pacemaker wire. Bilateral carotid bulb calcified plaques. Probable subcutaneous lipoma of the posterior neck. IMPRESSION: 1. No acute intracranial pathology. Moderate age-related atrophy and chronic microvascular ischemic changes. 2. No acute/traumatic cervical spine pathology. Old fracture of the dens with nonunion and increased distraction. Electronically Signed   By: Anner Crete M.D.   On: 07/28/2019 21:54   CT ABDOMEN PELVIS W CONTRAST  Result Date: 07/28/2019 CLINICAL DATA:  79 year old male with trauma. Left-sided chest pain. EXAM: CT CHEST, ABDOMEN, AND PELVIS WITH CONTRAST TECHNIQUE: Multidetector CT imaging of the chest, abdomen and pelvis was performed following the standard protocol during bolus administration of intravenous contrast. CONTRAST:  127mL OMNIPAQUE IOHEXOL 350 MG/ML SOLN COMPARISON:  Chest radiograph dated 11/14/2018. FINDINGS: CT CHEST FINDINGS Cardiovascular: There is no cardiomegaly  or pericardial effusion. There is 3 vessel coronary vascular calcification. Left-sided pacemaker. There is moderate atherosclerotic calcification of the thoracic aorta. No aneurysmal dilatation or dissection. The origins of the great vessels of the aortic arch appear patent as visualized. The central pulmonary arteries are grossly unremarkable. Mediastinum/Nodes: There is no hilar or mediastinal adenopathy. The esophagus and the thyroid gland are grossly unremarkable. No mediastinal fluid collection or hematoma. Lungs/Pleura: The lungs are clear. There is a 4 mm right middle lobe calcified granuloma. There is no pleural effusion or pneumothorax. The central airways are patent. Musculoskeletal: No acute osseous pathology. Degenerative changes of the spine. CT ABDOMEN PELVIS FINDINGS No intra-abdominal free air. Small free fluid within the pelvis.  Hepatobiliary: Fatty infiltration of the liver with morphologic changes of early cirrhosis. Cholecystectomy. Pancreas: Mild stranding of the peripancreatic fat may be related to underlying liver disease. Correlation with pancreatic enzymes recommended to exclude pancreatitis. Spleen: Normal in size without focal abnormality. Adrenals/Urinary Tract: The adrenal glands are unremarkable. There is no hydronephrosis on either side. There is symmetric enhancement and excretion of contrast by both kidneys. The visualized ureters appear unremarkable. The urinary bladder is decompressed around a Foley catheter. Two adjacent calcific foci with the larger measuring 4 mm along the bladder wall adjacent to the balloon of the Foley catheter may represent focal calcification of the bladder wall or stone within the bladder. Stomach/Bowel: Postsurgical changes of the gastroesophageal junction. There is postsurgical changes of the bowel. There is moderate stool throughout the colon. There are scattered colonic diverticula without active inflammatory changes. There is no bowel obstruction or active inflammation. The appendix is normal. Vascular/Lymphatic: Advanced aortoiliac atherosclerotic disease. There is a 2.5 cm infrarenal aortic ectasia. The IVC is unremarkable. No portal venous gas. There is no adenopathy. Reproductive: The prostate is suboptimally visualized. Other: Mild diffuse subcutaneous edema. Musculoskeletal: Osteopenia with degenerative changes of the spine. No acute osseous pathology. IMPRESSION: 1. No traumatic intrathoracic, abdominal, or pelvic pathology. 2. Fatty liver with morphologic changes of early cirrhosis. 3. Mild stranding of the peripancreatic fat may be related to underlying liver disease. Correlation with pancreatic enzymes recommended to exclude pancreatitis. 4. Colonic diverticulosis. No bowel obstruction. Normal appendix. 5. Aortic Atherosclerosis (ICD10-I70.0). Electronically Signed   By: Anner Crete M.D.   On: 07/28/2019 22:19   DG Hip Unilat W or Wo Pelvis 1 View Left  Result Date: 07/28/2019 CLINICAL DATA:  Fall, left hip pain EXAM: DG HIP (WITH OR WITHOUT PELVIS) 1V*L* COMPARISON:  04/15/2018 FINDINGS: Hip joints are symmetric. Joint spaces maintained. No acute bony abnormality. Specifically, no fracture, subluxation, or dislocation. IMPRESSION: No acute bony abnormality. Electronically Signed   By: Rolm Baptise M.D.   On: 07/28/2019 17:34    Scheduled Meds: . sodium chloride   Intravenous Once  . amoxicillin-clavulanate  1 tablet Oral Q12H  . citalopram  20 mg Oral Daily  . diltiazem  120 mg Oral BID  . ferrous sulfate  325 mg Oral Q breakfast  . ferrous sulfate  325 mg Oral Q breakfast  . gabapentin  100 mg Oral QHS  . glimepiride  1 mg Oral QAC breakfast  . pantoprazole  40 mg Oral BID AC  . potassium chloride  40 mEq Oral BID  . pravastatin  40 mg Oral QPM  . tamsulosin  0.4 mg Oral Daily    Continuous Infusions:   LOS: 0 days     Kayleen Memos, MD [facility] Pager [phone]  If 7PM-7AM, please contact  night-coverage [email or web address] Password [facility] 07/29/2019, 3:45 PM

## 2019-07-29 NOTE — Evaluation (Signed)
Physical Therapy Evaluation Patient Details Name: [name] MRN: [id number] DOB: 1940/06/14 Today's Date: 07/29/2019   History of Present Illness  79 yo male admitted to ED on 5/3 with weakess, falls x4 in 48 hours; pt recovering from UTI. Pt with heme + stool, head/neck/hip imaging negative for acute findings s/p falls. PMH includes anemia, OA, balance impairment, CAP, CAD, cervical radiculopathy, COPD, DM, GERD, CVA, L eye legal blindness, afib, pacemaker.  Clinical Impression   Pt presents with LE weakness, unsteadiness in standing with recent history of multiple falls, difficulty performing mobility tasks, and decreased activity tolerance. Pt to benefit from acute PT to address deficits. Pt ambulated short hallway distance with heavy reliance on RW, and required at least min assist for bed mobility and transfers this day. PTA, pt was living at home with his children but states they were not around to help him with mobility or self-care tasks. PT recommending SNF level of care post-acutely to address mobility deficits and for pt safety. Pt in agreement with d/c to SNF. PT to progress mobility as tolerated, and will continue to follow acutely.      Follow Up Recommendations SNF;Supervision/Assistance - 24 hour    Equipment Recommendations  None recommended by PT    Recommendations for Other Services       Precautions / Restrictions Precautions Precautions: Fall;ICD/Pacemaker Restrictions Weight Bearing Restrictions: No      Mobility  Bed Mobility Overal bed mobility: Needs Assistance Bed Mobility: Supine to Sit;Sit to Supine     Supine to sit: Min assist;HOB elevated Sit to supine: Min assist;HOB elevated   General bed mobility comments: min assist for trunk and LE maangement, scooting to and from EOB. Use of bedrails to perform.  Transfers Overall transfer level: Needs assistance Equipment used: Rolling walker (2 wheeled) Transfers: Sit to/from Stand Sit to Stand:  Min assist;From elevated surface         General transfer comment: Min assist for initial power up, self-steady with RW. Verbal cuing for appropriate hand placement when rising.  Ambulation/Gait Ambulation/Gait assistance: Min guard Gait Distance (Feet): 45 Feet Assistive device: Rolling walker (2 wheeled) Gait Pattern/deviations: Step-through pattern;Decreased stride length;Trunk flexed;Shuffle Gait velocity: decr   General Gait Details: Min gaurd for safety, verbal cuing for upright posture, placement in RW. Very slow gait with short step length and narrow BOS.  Stairs            Wheelchair Mobility    Modified Rankin (Stroke Patients Only)       Balance Overall balance assessment: Needs assistance;History of Falls Sitting-balance support: No upper extremity supported;Feet supported Sitting balance-Leahy Scale: Fair     Standing balance support: Bilateral upper extremity supported;During functional activity Standing balance-Leahy Scale: Poor Standing balance comment: reliant on use of RW                             Pertinent Vitals/Pain Pain Assessment: No/denies pain    Home Living Family/patient expects to be discharged to:: Private residence Living Arrangements: Children Available Help at Discharge: Family;Available PRN/intermittently Type of Home: Mobile home Home Access: Stairs to enter Entrance Stairs-Rails: Right;Left Entrance Stairs-Number of Steps: 4 Home Layout: One level Home Equipment: Walker - 4 wheels;Cane - single point;Shower seat;Grab bars - tub/shower;Grab bars - toilet      Prior Function Level of Independence: Independent with assistive device(s)         Comments: pt reports using cane or rollator for  ambulation, but states "I wasn't moving that much, I kept falling" PTA. Pt states his family does not help him with anything, and states they work all day so he is home alone.     Hand Dominance   Dominant Hand:  Right    Extremity/Trunk Assessment   Upper Extremity Assessment Upper Extremity Assessment: Generalized weakness    Lower Extremity Assessment Lower Extremity Assessment: Generalized weakness    Cervical / Trunk Assessment Cervical / Trunk Assessment: Normal  Communication   Communication: No difficulties  Cognition Arousal/Alertness: Awake/alert Behavior During Therapy: WFL for tasks assessed/performed Overall Cognitive Status: Within Functional Limits for tasks assessed Area of Impairment: Following commands;Safety/judgement;Awareness;Problem solving                       Following Commands: Follows one step commands consistently Safety/Judgement: Decreased awareness of safety   Problem Solving: Slow processing;Difficulty sequencing;Requires verbal cues;Requires tactile cues General Comments: Pt pleasant and agreeable to PT, although states he feels very weak due to not eating yet. Follows mobility commands well, requires verbal cuing for safety during mobility.      General Comments General comments (skin integrity, edema, etc.): HRmax 111 bpm, in afib rhythm    Exercises     Assessment/Plan    PT Assessment Patient needs continued PT services  PT Problem List Decreased strength;Decreased mobility;Decreased safety awareness;Decreased activity tolerance;Decreased balance;Decreased knowledge of use of DME;Cardiopulmonary status limiting activity       PT Treatment Interventions DME instruction;Therapeutic activities;Gait training;Therapeutic exercise;Patient/family education;Balance training;Functional mobility training;Neuromuscular re-education    PT Goals (Current goals can be found in the Care Plan section)  Acute Rehab PT Goals Patient Stated Goal: get stronger at rehab PT Goal Formulation: With patient Time For Goal Achievement: 08/12/19 Potential to Achieve Goals: Good    Frequency Min 2X/week   Barriers to discharge Decreased caregiver  support      Co-evaluation               AM-PAC PT "6 Clicks" Mobility  Outcome Measure Help needed turning from your back to your side while in a flat bed without using bedrails?: A Little Help needed moving from lying on your back to sitting on the side of a flat bed without using bedrails?: A Little Help needed moving to and from a bed to a chair (including a wheelchair)?: A Little Help needed standing up from a chair using your arms (e.g., wheelchair or bedside chair)?: A Little Help needed to walk in hospital room?: A Little Help needed climbing 3-5 steps with a railing? : A Lot 6 Click Score: 17    End of Session Equipment Utilized During Treatment: Gait belt Activity Tolerance: Patient limited by fatigue Patient left: in bed;with call bell/phone within reach Nurse Communication: Mobility status PT Visit Diagnosis: Other abnormalities of gait and mobility (R26.89);Muscle weakness (generalized) (M62.81);Repeated falls (R29.6)    Time: [id number] PT Time Calculation (min) (ACUTE ONLY): 19 min   Charges:   PT Evaluation $PT Eval Low Complexity: 1 Low          Josie Burleigh E, PT Acute Rehabilitation Services Pager [phone]  Office [phone]  Zakarie Sturdivant D Elonda Husky 07/29/2019, 4:13 PM

## 2019-07-29 NOTE — ED Notes (Signed)
Foley leg bag changed into standard drainage bag.

## 2019-07-29 NOTE — TOC Initial Note (Signed)
Transition of Care [facility]) - Initial/Assessment Note    Patient Details  Name: [name] MRN: [id number] Date of Birth: 07-28-1940  [facility] of Care [facility] CM/SW Contact:    Verdell Carmine, RN Phone Number: 07/29/2019, 8:17 AM  Clinical Narrative:                 Consult for home health services Has had frequent emergency room visits in the last 6 months. Has used [facility] in the past ( 2019) for home health services.  Patient states that he felt they did not meet his needs, "they were in and out" Will need PT eval. Does have rolling walker with seat at home.   Expected Discharge Plan: [place] Barriers to Discharge: Continued Medical Work up   Patient Goals and CMS Choice  Back home with Help      Expected Discharge Plan and Services Expected Discharge Plan: [place]   Discharge Planning Services: CM Consult Post Acute Care Choice: Home Health                                        Prior Living Arrangements/Services                       Activities of Daily Living      Permission Sought/Granted                  Emotional Assessment Appearance:: Appears stated age       Alcohol / Substance Use: Tobacco Use Psych Involvement: No (comment)  Admission diagnosis:  Symptomatic anemia [D64.9] Patient Active Problem List   Diagnosis Date Noted  . Cardiac pacemaker in situ 10/29/2018  . Major depressive disorder, recurrent severe without psychotic features ([place]) 04/15/2018  . Atrial fibrillation with RVR ([place]) 11/08/2017  . HTN (hypertension) 05/21/2017  . Diabetes mellitus, type II ([place]) 05/21/2017  . Diastolic CHF, chronic ([place]) 05/21/2017  . COPD (chronic obstructive pulmonary disease) ([place]) 05/21/2017  . H/O atrial flutter 05/21/2017  . Heme positive stool   . Symptomatic anemia 07/10/2016  . Cardiac syncope 05/15/2015  . Cervical disc disease 05/15/2015  . Anemia 05/15/2015  . History of CVA  (cerebrovascular accident) 04/03/2014  . Benign neoplasm of rectum and anal canal 12/02/2013  . Tobacco use disorder 11/30/2013  . Mixed hyperlipidemia 06/06/2013  . Type 2 diabetes mellitus ([place]) 01/22/2013  . Obesity (BMI 30-39.9) 12/14/2012  . Persistent atrial fibrillation ([place]) 10/07/2010  . Atrial flutter - status post CTI ablation 07/29/2010  . Essential hypertension 07/29/2010  . Chronic diastolic congestive heart failure ([place]) 07/29/2010  . Atherosclerotic heart disease of native coronary artery with other forms of angina pectoris ([place]) 07/29/2010   PCP:  Nicolette Bang, DO Pharmacy:   [id number] - [place], [place] [address] Phone: [phone] Fax: [phone]     Social Determinants of Health (SDOH) Interventions    Readmission Risk Interventions No flowsheet data found.

## 2019-07-29 NOTE — Plan of Care (Signed)

## 2019-07-30 DIAGNOSIS — W19XXXA Unspecified fall, initial encounter: Secondary | ICD-10-CM

## 2019-07-30 DIAGNOSIS — I10 Essential (primary) hypertension: Secondary | ICD-10-CM

## 2019-07-30 DIAGNOSIS — E1165 Type 2 diabetes mellitus with hyperglycemia: Secondary | ICD-10-CM

## 2019-07-30 LAB — BASIC METABOLIC PANEL
Anion gap: 11 (ref 5–15)
BUN: 20 mg/dL (ref 8–23)
CO2: 19 mmol/L — ABNORMAL LOW (ref 22–32)
Calcium: 8.6 mg/dL — ABNORMAL LOW (ref 8.9–10.3)
Chloride: 104 mmol/L (ref 98–111)
Creatinine, Ser: 1.47 mg/dL — ABNORMAL HIGH (ref 0.61–1.24)
GFR calc Af Amer: 51 mL/min — ABNORMAL LOW (ref >60)
GFR calc non Af Amer: 44 mL/min — ABNORMAL LOW (ref >60)
Glucose, Bld: 196 mg/dL — ABNORMAL HIGH (ref 70–99)
Potassium: 4.2 mmol/L (ref 3.5–5.1)
Sodium: 134 mmol/L — ABNORMAL LOW (ref 135–145)

## 2019-07-30 LAB — GLUCOSE, CAPILLARY
Glucose-Capillary: 116 mg/dL — ABNORMAL HIGH (ref 70–99)
Glucose-Capillary: 188 mg/dL — ABNORMAL HIGH (ref 70–99)
Glucose-Capillary: 177 mg/dL — ABNORMAL HIGH (ref 70–99)
Glucose-Capillary: 136 mg/dL — ABNORMAL HIGH (ref 70–99)

## 2019-07-30 LAB — CBC
HCT: 29.2 % — ABNORMAL LOW (ref 39.0–52.0)
Hemoglobin: 8.9 g/dL — ABNORMAL LOW (ref 13.0–17.0)
MCH: 25.9 pg — ABNORMAL LOW (ref 26.0–34.0)
MCHC: 30.5 g/dL (ref 30.0–36.0)
MCV: 84.9 fL (ref 80.0–100.0)
Platelets: 133 10*3/uL — ABNORMAL LOW (ref 150–400)
RBC: 3.44 MIL/uL — ABNORMAL LOW (ref 4.22–5.81)
RDW: 17.2 % — ABNORMAL HIGH (ref 11.5–15.5)
WBC: 4.3 10*3/uL (ref 4.0–10.5)
nRBC: 0 % (ref 0.0–0.2)

## 2019-07-30 MED ORDER — POTASSIUM CHLORIDE CRYS ER 20 MEQ PO TBCR
40.0000 meq | EXTENDED_RELEASE_TABLET | Freq: Two times a day (BID) | ORAL | Status: AC
  Administered 2019-07-30 (×2): 40 meq via ORAL
  Filled 2019-07-30 (×2): qty 2

## 2019-07-30 NOTE — Progress Notes (Signed)
[facility] PROGRESS NOTE    Progress Note  [name]  [id number] DOB: [birth year] DOA: 07/28/2019 PCP: Nicolette Bang, DO     Brief Narrative:   [name] is an 79 y.o. male past medical history significant for allergic rhinitis, chronic normocytic anemia atrial flutter/atrial fibrillation not on anticoagulation due to history of GI bleed, chronic diastolic heart failure, COPD, diabetes mellitus type 2 nonhemorrhagic CVA, also history of peptic ulcer disease legally blind comes in to the ED with progressive weakness associated with multiple falls dyspnea cough and lower extremity bilateral edema in the ED was found to have a hemoglobin of 7 FOBT positive was given a unit of packed red blood cells  Assessment/Plan:   Generalized weakness and fall probably due to Symptomatic iron deficiency anemia: He is status post 1 unit of packed red blood cells his hemoglobin this morning is 9.2, his ferritin was 12 he was started on oral ferrous sulfate. Continue PPI twice a day, avoid NSAIDs. His blood pressure is borderline low hold antihypertensive medication, see below for further details.  Hypotension/dehydration: He is only on diltiazem and his blood pressure has been running borderline low, will started on gentle IV fluid hydration we will continue this monitor strict I's and O's and daily weights he relates his dyspnea is improved.  Permanent atrial fibrillation: Rate controlled not on anticoagulation due to GI bleed.  Hyperkalemia: Was repleted orally basic metabolic panel is pending this morning.  Hypomagnesemia: Repleted IV at goal of magnesium is greater than 2.  Diabetes mellitus type 2 with hyperglycemia: With an A1c of 8. Continue to hold all oral hypoglycemic agents continue sliding scale  Essential hypertension Only on diltiazem keep a close eye on his blood pressure is borderline.  COPD (chronic obstructive pulmonary disease) (HCC) Stable,  noted.    DVT prophylaxis: lovenox Family Communication:none Status is: Inpatient  Remains inpatient appropriate because:Hemodynamically unstable   Dispo: The patient is from: SNF              Anticipated d/c is to: SNF              Anticipated d/c date is: 1 day              Patient currently is not medically stable to d/c.         Code Status:     Code Status Orders  (From admission, onward)         Start     Ordered   07/29/19 0020  Full code  Continuous     07/29/19 0020        Code Status History    Date Active Date Inactive Code Status Order ID Comments User Context   10/19/2018 2344 10/23/2018 1849 Full Code [id number]  Bonnell Public, MD Inpatient   04/14/2018 1857 04/15/2018 1721 Full Code [id number]  Street, [place], [place] ED   01/18/2018 1703 01/20/2018 1446 Full Code [id number]  Baldwin Jamaica, PA-C ED   11/12/2017 1702 11/16/2017 1919 Full Code [id number]  Rory Percy, DO ED   11/08/2017 1835 11/10/2017 1746 Full Code [id number]  Rory Percy, DO Inpatient   10/04/2017 1536 10/06/2017 1538 Full Code [id number]  Lady Deutscher, MD ED   05/21/2017 1834 05/22/2017 1853 Full Code [id number]  Samella Parr, NP ED   11/28/2016 2346 11/30/2016 2054 Full Code [id number]  Karmen Bongo, MD Inpatient   10/22/2016 2340 10/23/2016 1614 Full Code [id number]  Rise Patience, MD Inpatient  10/01/2016 2027 10/03/2016 2123 Full Code [id number]  Merton Border, MD Inpatient   07/10/2016 1548 07/12/2016 1702 Full Code [id number]  Florinda Marker, MD Inpatient   05/15/2015 1848 05/17/2015 1740 Full Code [id number]  Samella Parr, NP Inpatient   03/10/2015 1919 03/11/2015 2012 Full Code [id number]  Cristal Ford, DO Inpatient   09/18/2014 1200 09/24/2014 1629 Full Code [id number]  Oswald Hillock, MD Inpatient   02/15/2014 2115 02/17/2014 1815 Full Code [id number]  Corky Sox, MD Inpatient   01/08/2014 1417 01/09/2014 1547 Full Code [id number]  Francesca Oman, DO Inpatient    11/30/2013 0623 12/02/2013 1821 Full Code [id number]  Theressa Millard, MD Inpatient   07/21/2013 1904 07/22/2013 2057 Full Code [id number]  Crista Luria Inpatient   01/22/2013 2224 01/25/2013 1313 Full Code [id number]  Rise Patience, MD Inpatient   12/04/2011 [phone] 12/05/2011 1948 Full Code [id number]  Phillips Climes, MD ED   Advance Care Planning Activity    Advance Directive Documentation     Most Recent Value  Type of Advance Directive  Healthcare Power of Attorney  Pre-existing out of facility DNR order (yellow form or pink MOST form)  --  "MOST" Form in Place?  --        IV Access:    Peripheral IV   Procedures and diagnostic studies:   DG Ribs Unilateral W/Chest Left  Result Date: 07/28/2019 CLINICAL DATA:  Multiple recent falls.  Left hip, rib and chest pain EXAM: LEFT RIBS AND CHEST - 3+ VIEW COMPARISON:  None. FINDINGS: No fracture or other bone lesions are seen involving the ribs. There is no evidence of pneumothorax or pleural effusion. Mild bilateral interstitial thickening similar in appearance to the prior exams. No focal consolidation, pleural effusion or pneumothorax. Heart size and mediastinal contours are within normal limits. Dual lead cardiac pacemaker. IMPRESSION: Negative. Electronically Signed   By: Kathreen Devoid   On: 07/28/2019 17:06   DG Abd 1 View  Result Date: 07/28/2019 CLINICAL DATA:  Multiple recent falls.  Left hip rib and chest pain EXAM: ABDOMEN - 1 VIEW COMPARISON:  None. FINDINGS: Prior cholecystectomy. Nonobstructive bowel gas pattern. No organomegaly or free air. Degenerative changes within the lumbar spine. No acute bony abnormality. IMPRESSION: No acute findings. Electronically Signed   By: Rolm Baptise M.D.   On: 07/28/2019 17:33   CT Head Wo Contrast  Result Date: 07/28/2019 CLINICAL DATA:  79 year old male with trauma. EXAM: CT HEAD WITHOUT CONTRAST CT CERVICAL SPINE WITHOUT CONTRAST TECHNIQUE: Multidetector CT imaging of the head and  cervical spine was performed following the standard protocol without intravenous contrast. Multiplanar CT image reconstructions of the cervical spine were also generated. COMPARISON:  CT dated 11/14/2018. FINDINGS: CT HEAD FINDINGS Brain: Moderate age-related atrophy and chronic microvascular ischemic changes. There is no acute intracranial hemorrhage. No mass effect or midline shift. No extra-axial fluid collection. Vascular: No hyperdense vessel or unexpected calcification. Skull: Normal. Negative for fracture or focal lesion. Sinuses/Orbits: No acute finding. Other: None. CT CERVICAL SPINE FINDINGS Alignment: No acute subluxation. There is straightening of normal cervical lordosis which may be positional or due to muscle spasm or chronic. Skull base and vertebrae: No acute fracture. 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 There has been interval increase in the distraction gap since the prior CT. Soft tissues and spinal canal: No prevertebral fluid or swelling. No visible canal hematoma. Disc levels:  Multilevel degenerative changes with bone spurring. Upper chest:  Negative. Other: Partially visualized pacemaker wire. Bilateral carotid bulb calcified plaques. Probable subcutaneous lipoma of the posterior neck. IMPRESSION: 1. No acute intracranial pathology. Moderate age-related atrophy and chronic microvascular ischemic changes. 2. No acute/traumatic cervical spine pathology. Old fracture of the dens with nonunion and increased distraction. Electronically Signed   By: Anner Crete M.D.   On: 07/28/2019 21:54   CT Chest W Contrast  Result Date: 07/28/2019 CLINICAL DATA:  79 year old male with trauma. Left-sided chest pain. EXAM: CT CHEST, ABDOMEN, AND PELVIS WITH CONTRAST TECHNIQUE: Multidetector CT imaging of the chest, abdomen and pelvis was performed following the standard protocol during bolus administration of intravenous contrast. CONTRAST:  14mL OMNIPAQUE IOHEXOL 350 MG/ML  SOLN COMPARISON:  Chest radiograph dated 11/14/2018. FINDINGS: CT CHEST FINDINGS Cardiovascular: There is no cardiomegaly or pericardial effusion. There is 3 vessel coronary vascular calcification. Left-sided pacemaker. There is moderate atherosclerotic calcification of the thoracic aorta. No aneurysmal dilatation or dissection. The origins of the great vessels of the aortic arch appear patent as visualized. The central pulmonary arteries are grossly unremarkable. Mediastinum/Nodes: There is no hilar or mediastinal adenopathy. The esophagus and the thyroid gland are grossly unremarkable. No mediastinal fluid collection or hematoma. Lungs/Pleura: The lungs are clear. There is a 4 mm right middle lobe calcified granuloma. There is no pleural effusion or pneumothorax. The central airways are patent. Musculoskeletal: No acute osseous pathology. Degenerative changes of the spine. CT ABDOMEN PELVIS FINDINGS No intra-abdominal free air. Small free fluid within the pelvis. Hepatobiliary: Fatty infiltration of the liver with morphologic changes of early cirrhosis. Cholecystectomy. Pancreas: Mild stranding of the peripancreatic fat may be related to underlying liver disease. Correlation with pancreatic enzymes recommended to exclude pancreatitis. Spleen: Normal in size without focal abnormality. Adrenals/Urinary Tract: The adrenal glands are unremarkable. There is no hydronephrosis on either side. There is symmetric enhancement and excretion of contrast by both kidneys. The visualized ureters appear unremarkable. The urinary bladder is decompressed around a Foley catheter. Two adjacent calcific foci with the larger measuring 4 mm along the bladder wall adjacent to the balloon of the Foley catheter may represent focal calcification of the bladder wall or stone within the bladder. Stomach/Bowel: Postsurgical changes of the gastroesophageal junction. There is postsurgical changes of the bowel. There is moderate stool throughout  the colon. There are scattered colonic diverticula without active inflammatory changes. There is no bowel obstruction or active inflammation. The appendix is normal. Vascular/Lymphatic: Advanced aortoiliac atherosclerotic disease. There is a 2.5 cm infrarenal aortic ectasia. The IVC is unremarkable. No portal venous gas. There is no adenopathy. Reproductive: The prostate is suboptimally visualized. Other: Mild diffuse subcutaneous edema. Musculoskeletal: Osteopenia with degenerative changes of the spine. No acute osseous pathology. IMPRESSION: 1. No traumatic intrathoracic, abdominal, or pelvic pathology. 2. Fatty liver with morphologic changes of early cirrhosis. 3. Mild stranding of the peripancreatic fat may be related to underlying liver disease. Correlation with pancreatic enzymes recommended to exclude pancreatitis. 4. Colonic diverticulosis. No bowel obstruction. Normal appendix. 5. Aortic Atherosclerosis (ICD10-I70.0). Electronically Signed   By: Anner Crete M.D.   On: 07/28/2019 22:19   CT Cervical Spine Wo Contrast  Result Date: 07/28/2019 CLINICAL DATA:  79 year old male with trauma. EXAM: CT HEAD WITHOUT CONTRAST CT CERVICAL SPINE WITHOUT CONTRAST TECHNIQUE: Multidetector CT imaging of the head and cervical spine was performed following the standard protocol without intravenous contrast. Multiplanar CT image reconstructions of the cervical spine were also generated. COMPARISON:  CT dated 11/14/2018. FINDINGS: CT HEAD FINDINGS Brain: Moderate age-related  atrophy and chronic microvascular ischemic changes. There is no acute intracranial hemorrhage. No mass effect or midline shift. No extra-axial fluid collection. Vascular: No hyperdense vessel or unexpected calcification. Skull: Normal. Negative for fracture or focal lesion. Sinuses/Orbits: No acute finding. Other: None. CT CERVICAL SPINE FINDINGS Alignment: No acute subluxation. There is straightening of normal cervical lordosis which may be  positional or due to muscle spasm or chronic. Skull base and vertebrae: No acute fracture. 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 There has been interval increase in the distraction gap since the prior CT. Soft tissues and spinal canal: No prevertebral fluid or swelling. No visible canal hematoma. Disc levels:  Multilevel degenerative changes with bone spurring. Upper chest: Negative. Other: Partially visualized pacemaker wire. Bilateral carotid bulb calcified plaques. Probable subcutaneous lipoma of the posterior neck. IMPRESSION: 1. No acute intracranial pathology. Moderate age-related atrophy and chronic microvascular ischemic changes. 2. No acute/traumatic cervical spine pathology. Old fracture of the dens with nonunion and increased distraction. Electronically Signed   By: Anner Crete M.D.   On: 07/28/2019 21:54   CT ABDOMEN PELVIS W CONTRAST  Result Date: 07/28/2019 CLINICAL DATA:  79 year old male with trauma. Left-sided chest pain. EXAM: CT CHEST, ABDOMEN, AND PELVIS WITH CONTRAST TECHNIQUE: Multidetector CT imaging of the chest, abdomen and pelvis was performed following the standard protocol during bolus administration of intravenous contrast. CONTRAST:  187mL OMNIPAQUE IOHEXOL 350 MG/ML SOLN COMPARISON:  Chest radiograph dated 11/14/2018. FINDINGS: CT CHEST FINDINGS Cardiovascular: There is no cardiomegaly or pericardial effusion. There is 3 vessel coronary vascular calcification. Left-sided pacemaker. There is moderate atherosclerotic calcification of the thoracic aorta. No aneurysmal dilatation or dissection. The origins of the great vessels of the aortic arch appear patent as visualized. The central pulmonary arteries are grossly unremarkable. Mediastinum/Nodes: There is no hilar or mediastinal adenopathy. The esophagus and the thyroid gland are grossly unremarkable. No mediastinal fluid collection or hematoma. Lungs/Pleura: The lungs are clear. There is a 4 mm right  middle lobe calcified granuloma. There is no pleural effusion or pneumothorax. The central airways are patent. Musculoskeletal: No acute osseous pathology. Degenerative changes of the spine. CT ABDOMEN PELVIS FINDINGS No intra-abdominal free air. Small free fluid within the pelvis. Hepatobiliary: Fatty infiltration of the liver with morphologic changes of early cirrhosis. Cholecystectomy. Pancreas: Mild stranding of the peripancreatic fat may be related to underlying liver disease. Correlation with pancreatic enzymes recommended to exclude pancreatitis. Spleen: Normal in size without focal abnormality. Adrenals/Urinary Tract: The adrenal glands are unremarkable. There is no hydronephrosis on either side. There is symmetric enhancement and excretion of contrast by both kidneys. The visualized ureters appear unremarkable. The urinary bladder is decompressed around a Foley catheter. Two adjacent calcific foci with the larger measuring 4 mm along the bladder wall adjacent to the balloon of the Foley catheter may represent focal calcification of the bladder wall or stone within the bladder. Stomach/Bowel: Postsurgical changes of the gastroesophageal junction. There is postsurgical changes of the bowel. There is moderate stool throughout the colon. There are scattered colonic diverticula without active inflammatory changes. There is no bowel obstruction or active inflammation. The appendix is normal. Vascular/Lymphatic: Advanced aortoiliac atherosclerotic disease. There is a 2.5 cm infrarenal aortic ectasia. The IVC is unremarkable. No portal venous gas. There is no adenopathy. Reproductive: The prostate is suboptimally visualized. Other: Mild diffuse subcutaneous edema. Musculoskeletal: Osteopenia with degenerative changes of the spine. No acute osseous pathology. IMPRESSION: 1. No traumatic intrathoracic, abdominal, or pelvic pathology. 2.  Fatty liver with morphologic changes of early cirrhosis. 3. Mild stranding of the  peripancreatic fat may be related to underlying liver disease. Correlation with pancreatic enzymes recommended to exclude pancreatitis. 4. Colonic diverticulosis. No bowel obstruction. Normal appendix. 5. Aortic Atherosclerosis (ICD10-I70.0). Electronically Signed   By: Anner Crete M.D.   On: 07/28/2019 22:19   DG Hip Unilat W or Wo Pelvis 1 View Left  Result Date: 07/28/2019 CLINICAL DATA:  Fall, left hip pain EXAM: DG HIP (WITH OR WITHOUT PELVIS) 1V*L* COMPARISON:  04/15/2018 FINDINGS: Hip joints are symmetric. Joint spaces maintained. No acute bony abnormality. Specifically, no fracture, subluxation, or dislocation. IMPRESSION: No acute bony abnormality. Electronically Signed   By: Rolm Baptise M.D.   On: 07/28/2019 17:34     Medical Consultants:    None.  Anti-Infectives:   none  Subjective:    [name] he relates he feels better than when he came in but he is still weak.  Objective:    Vitals:   07/29/19 2025 07/30/19 0149 07/30/19 0152 07/30/19 0446  BP: (!) 103/48  98/63 (!) 107/59  Pulse: 86  89 91  Resp: 16  17 16   Temp: 98.1 F (36.7 C)  97.9 F (36.6 C) 97.9 F (36.6 C)  TempSrc: Oral  Oral Oral  SpO2: 99%  95% 93%  Weight:  75.8 kg    Height:       SpO2: 93 % O2 Flow Rate (L/min): 0 L/min   Intake/Output Summary (Last 24 hours) at 07/30/2019 0803 Last data filed at 07/30/2019 0455 Gross per 24 hour  Intake 953.69 ml  Output 750 ml  Net 203.69 ml   Filed Weights   07/28/19 1521 07/30/19 0149  Weight: 77.1 kg 75.8 kg    Exam: General exam: In no acute distress. Respiratory system: Good air movement and clear to auscultation. Cardiovascular system: S1 & S2 heard, RRR. No JVD. Gastrointestinal system: Abdomen is nondistended, soft and nontender.  Extremities: No pedal edema. Skin: No rashes, lesions or ulcers  Data Reviewed:    Labs: Basic Metabolic Panel: Recent Labs  Lab 07/28/19 1634 07/29/19 0525  NA 137 138  K 3.8 3.3*  CL  106 105  CO2 21* 20*  GLUCOSE 192* 158*  BUN 21 18  CREATININE 1.38* 1.33*  CALCIUM 9.2 9.3  MG  --  1.7  PHOS  --  3.0   GFR Estimated Creatinine Clearance: 40 mL/min (A) (by C-G formula based on SCr of 1.33 mg/dL (H)). Liver Function Tests: Recent Labs  Lab 07/28/19 1634 07/29/19 0525  AST 39 44*  ALT 29 31  ALKPHOS 138* 133*  BILITOT 0.8 1.3*  PROT 8.9* 8.8*  ALBUMIN 2.6* 2.5*   Recent Labs  Lab 07/28/19 1634  LIPASE 36   No results for input(s): AMMONIA in the last 168 hours. Coagulation profile No results for input(s): INR, PROTIME in the last 168 hours. COVID-19 Labs  Recent Labs    07/28/19 2020  FERRITIN 12*    Lab Results  Component Value Date   SARSCOV2NAA NEGATIVE 07/28/2019   [place] NEGATIVE 10/19/2018    CBC: Recent Labs  Lab 07/28/19 1634 07/29/19 0525  WBC 3.2* 8.0  NEUTROABS 1.9  --   HGB 7.6* 9.2*  HCT 24.5* 30.2*  MCV 84.5 85.1  PLT PLATELET CLUMPS NOTED ON SMEAR, UNABLE TO ESTIMATE 122*   Cardiac Enzymes: No results for input(s): CKTOTAL, CKMB, CKMBINDEX, TROPONINI in the last 168 hours. BNP (last 3 results) No results  for input(s): PROBNP in the last 8760 hours. CBG: Recent Labs  Lab 07/29/19 0741 07/29/19 1655 07/30/19 0648  GLUCAP 176* 116* 116*   D-Dimer: No results for input(s): DDIMER in the last 72 hours. Hgb A1c: Recent Labs    07/29/19 0525  HGBA1C 8.0*   Lipid Profile: No results for input(s): CHOL, HDL, LDLCALC, TRIG, CHOLHDL, LDLDIRECT in the last 72 hours. Thyroid function studies: No results for input(s): TSH, T4TOTAL, T3FREE, THYROIDAB in the last 72 hours.  Invalid input(s): FREET3 Anemia work up: Recent Labs    07/28/19 2020  VITAMINB12 718  FOLATE 21.8  FERRITIN 12*  TIBC 473*  IRON 19*  RETICCTPCT 2.1   Sepsis Labs: Recent Labs  Lab 07/28/19 1634 07/29/19 0525  WBC 3.2* 8.0   Microbiology Recent Results (from the past 240 hour(s))  Urine culture     Status: Abnormal    Collection Time: 07/28/19  4:34 PM   Specimen: Urine, Random  Result Value Ref Range Status   Specimen Description URINE, RANDOM  Final   Special Requests   Final    NONE Performed at [facility], [address] [place], [place] [address]    Culture MULTIPLE SPECIES PRESENT, SUGGEST RECOLLECTION (A)  Final   Report Status 07/29/2019 FINAL  Final  Respiratory Panel by RT PCR (Flu A&B, Covid) - Nasopharyngeal Swab     Status: None   Collection Time: 07/28/19  8:20 PM   Specimen: Nasopharyngeal Swab  Result Value Ref Range Status   SARS Coronavirus 2 by RT PCR NEGATIVE NEGATIVE Final    Comment: (NOTE) SARS-CoV-2 target nucleic acids are NOT DETECTED. The SARS-CoV-2 RNA is generally detectable in upper respiratoy specimens during the acute phase of infection. The lowest concentration of SARS-CoV-2 viral copies this assay can detect is 131 copies/mL. A negative result does not preclude SARS-Cov-2 infection and should not be used as the sole basis for treatment or other patient management decisions. A negative result may occur with  improper specimen collection/handling, submission of specimen other than nasopharyngeal swab, presence of viral mutation(s) within the areas targeted by this assay, and inadequate number of viral copies (<131 copies/mL). A negative result must be combined with clinical observations, patient history, and epidemiological information. The expected result is Negative. Fact Sheet for Patients:  PinkCheek.be Fact Sheet for Healthcare Providers:  GravelBags.it This test is not yet ap proved or cleared by the [place] FDA and  has been authorized for detection and/or diagnosis of SARS-CoV-2 by FDA under an Emergency Use Authorization (EUA). This EUA will remain  in effect (meaning this test can be used) for the duration of the COVID-19 declaration under Section 564(b)(1) of the Act, 21  U.S.C. section 360bbb-3(b)(1), unless the authorization is terminated or revoked sooner.    Influenza A by PCR NEGATIVE NEGATIVE Final   Influenza B by PCR NEGATIVE NEGATIVE Final    Comment: (NOTE) The Xpert Xpress SARS-CoV-2/FLU/RSV assay is intended as an aid in  the diagnosis of influenza from Nasopharyngeal swab specimens and  should not be used as a sole basis for treatment. Nasal washings and  aspirates are unacceptable for Xpert Xpress SARS-CoV-2/FLU/RSV  testing. Fact Sheet for Patients: PinkCheek.be Fact Sheet for Healthcare Providers: GravelBags.it This test is not yet approved or cleared by the [place] FDA and  has been authorized for detection and/or diagnosis of SARS-CoV-2 by  FDA under an Emergency Use Authorization (EUA). This EUA will remain  in effect (meaning this test  can be used) for the duration of the  Covid-19 declaration under Section 564(b)(1) of the Act, 21  U.S.C. section 360bbb-3(b)(1), unless the authorization is  terminated or revoked. Performed at [facility], [place] [address], [place], [place] [address]      Medications:   . sodium chloride   Intravenous Once  . Chlorhexidine Gluconate Cloth  6 each Topical Daily  . citalopram  20 mg Oral Daily  . diltiazem  120 mg Oral BID  . ferrous sulfate  325 mg Oral Q breakfast  . gabapentin  100 mg Oral QHS  . glimepiride  1 mg Oral QAC breakfast  . pantoprazole  40 mg Oral BID AC  . pravastatin  40 mg Oral QPM  . tamsulosin  0.4 mg Oral Daily   Continuous Infusions: . sodium chloride 50 mL/hr at 07/30/19 0729      LOS: 1 day   Charlynne Cousins  [facility]  07/30/2019, 8:03 AM

## 2019-07-30 NOTE — [place] FL2 (Signed)
[place] MEDICAID FL2 LEVEL OF CARE SCREENING TOOL     IDENTIFICATION  Patient Name: [name] Birthdate: 18-Nov-1940 Sex: male Admission Date (Current Location): 07/28/2019  [facility] and [place] Number:  Herbalist and Address:  [facility], [place] [address], [place], [place] [address]      Provider Number: [id number]  Attending Physician Name and Address:  Charlynne Cousins, MD  Relative Name and Phone Number:  [name] (daughter) [phone]    Current Level of Care: Hospital Recommended Level of Care: [place] Prior Approval Number:    Date Approved/Denied:   PASRR Number: [id number]  Discharge Plan: SNF    Current Diagnoses: Patient Active Problem List   Diagnosis Date Noted  . Cardiac pacemaker in situ 10/29/2018  . Major depressive disorder, recurrent severe without psychotic features ([place]) 04/15/2018  . Atrial fibrillation with RVR ([place]) 11/08/2017  . HTN (hypertension) 05/21/2017  . Diabetes mellitus, type II ([place]) 05/21/2017  . Diastolic CHF, chronic ([place]) 05/21/2017  . COPD (chronic obstructive pulmonary disease) ([place]) 05/21/2017  . H/O atrial flutter 05/21/2017  . Heme positive stool   . Symptomatic anemia 07/10/2016  . Cardiac syncope 05/15/2015  . Cervical disc disease 05/15/2015  . Anemia 05/15/2015  . History of CVA (cerebrovascular accident) 04/03/2014  . Benign neoplasm of rectum and anal canal 12/02/2013  . Tobacco use disorder 11/30/2013  . Mixed hyperlipidemia 06/06/2013  . Type 2 diabetes mellitus ([place]) 01/22/2013  . Obesity (BMI 30-39.9) 12/14/2012  . Persistent atrial fibrillation ([place]) 10/07/2010  . Atrial flutter - status post CTI ablation 07/29/2010  . Essential hypertension 07/29/2010  . Chronic diastolic congestive heart failure ([place]) 07/29/2010  . Atherosclerotic heart disease of native coronary artery with other forms of angina pectoris ([place]) 07/29/2010    Orientation  RESPIRATION BLADDER Height & Weight     Self, Time, Situation, Place  Normal Incontinent, External catheter Weight: 167 lb 3.2 oz (75.8 kg) Height:  5\' 6"  (167.6 cm)  BEHAVIORAL SYMPTOMS/MOOD NEUROLOGICAL BOWEL NUTRITION STATUS      Continent Diet(see discharge summary)  AMBULATORY STATUS COMMUNICATION OF NEEDS Skin   Limited Assist Verbally Skin abrasions, Other (Comment)(abrasion right leg)                       Personal Care Assistance Level of Assistance  Bathing, Total care, Feeding, Dressing Bathing Assistance: Limited assistance Feeding assistance: Independent Dressing Assistance: Limited assistance Total Care Assistance: Limited assistance   Functional Limitations Info  Hearing, Speech, Sight Sight Info: Impaired Hearing Info: Adequate Speech Info: Adequate    SPECIAL CARE FACTORS FREQUENCY  PT (By licensed PT), OT (By licensed OT)     PT Frequency: min 5x weekly OT Frequency: min 5x weekly            Contractures Contractures Info: Not present    Additional Factors Info  Code Status, Allergies Code Status Info: full Allergies Info: cyclobenzaprine, other (steroids speed up heart rate)           Current Medications (07/30/2019):  This is the current hospital active medication list Current Facility-Administered Medications  Medication Dose Route Frequency Provider Last Rate Last Admin  . 0.9 %  sodium chloride infusion (Manually program via Guardrails IV Fluids)   Intravenous Once Reubin Milan, MD      . 0.9 %  sodium chloride infusion   Intravenous Continuous Kayleen Memos, DO 50 mL/hr at 07/30/19 [id number] New Bag at  07/30/19 0729  . acetaminophen (TYLENOL) tablet 650 mg  650 mg Oral Q6H PRN Reubin Milan, MD       Or  . acetaminophen (TYLENOL) suppository 650 mg  650 mg Rectal Q6H PRN Reubin Milan, MD      . Chlorhexidine Gluconate Cloth 2 % PADS 6 each  6 each Topical Daily Kayleen Memos, DO   6 each at 07/30/19 [id number]  . citalopram  (CELEXA) tablet 20 mg  20 mg Oral Daily Reubin Milan, MD   20 mg at 07/30/19 [id number]  . diltiazem (CARDIZEM CD) 24 hr capsule 120 mg  120 mg Oral BID Reubin Milan, MD   120 mg at 07/30/19 [id number]  . ferrous sulfate tablet 325 mg  325 mg Oral Q breakfast Kayleen Memos, DO   325 mg at 07/30/19 [id number]  . gabapentin (NEURONTIN) capsule 100 mg  100 mg Oral QHS Reubin Milan, MD   100 mg at 07/29/19 2120  . glimepiride (AMARYL) tablet 1 mg  1 mg Oral QAC breakfast Reubin Milan, MD   1 mg at 07/30/19 [id number]  . nitroGLYCERIN (NITROSTAT) SL tablet 0.4 mg  0.4 mg Sublingual Q5 Min x 3 PRN Reubin Milan, MD      . ondansetron [facility]) tablet 4 mg  4 mg Oral Q6H PRN Reubin Milan, MD       Or  . ondansetron [facility]) injection 4 mg  4 mg Intravenous Q6H PRN Reubin Milan, MD      . pantoprazole (PROTONIX) EC tablet 40 mg  40 mg Oral BID AC Reubin Milan, MD   40 mg at 07/30/19 [id number]  . potassium chloride SA (KLOR-CON) CR tablet 40 mEq  40 mEq Oral BID Charlynne Cousins, MD   40 mEq at 07/30/19 0914  . pravastatin (PRAVACHOL) tablet 40 mg  40 mg Oral QPM Reubin Milan, MD   40 mg at 07/29/19 1752  . tamsulosin (FLOMAX) capsule 0.4 mg  0.4 mg Oral Daily Reubin Milan, MD   0.4 mg at 07/30/19 [id number]     Discharge Medications: Please see discharge summary for a list of discharge medications.  Relevant Imaging Results:  Relevant Lab Results:   Additional Information [id number]  Alberteen Sam, LCSW

## 2019-07-30 NOTE — TOC Initial Note (Signed)
Transition of Care [facility]) - Initial/Assessment Note    Patient Details  Name: [name] MRN: [id number] Date of Birth: [birth year]  [facility] CM/SW Contact:    Alberteen Sam, LCSW Phone Number: 07/30/2019, 12:39 PM  Clinical Narrative:                  CSW met with patient at bedside to inform of SNF rec made by PT, patient is agreeable to SNF at time of discharge and gave CSW permission to fax out referral for bed offers at this time as he has no preference to facility.   CSW has faxed out referrals for bed offers, pending bed offers at this time.   Expected Discharge Plan: Skilled Nursing Facility Barriers to Discharge: Continued Medical Work up   Patient Goals and CMS Choice Patient states their goals for this hospitalization and ongoing recovery are:: to go to rehab CMS Medicare.gov Compare Post Acute Care list provided to:: Patient Choice offered to / list presented to : Patient  Expected Discharge Plan and Services Expected Discharge Plan: [place]   Discharge Planning Services: CM Consult Post Acute Care Choice: [place] Living arrangements for the past 2 months: Single Family Home                                      Prior Living Arrangements/Services Living arrangements for the past 2 months: Single Family Home Lives with:: Self Patient language and need for interpreter reviewed:: Yes Do you feel safe going back to the place where you live?: Yes      Need for Family Participation in Patient Care: No (Comment) Care giver support system in place?: Yes (comment)   Criminal Activity/Legal Involvement Pertinent to Current Situation/Hospitalization: No - Comment as needed  Activities of Daily Living Home Assistive Devices/Equipment: Cane (specify quad or straight), Wheelchair, CBG Meter ADL Screening (condition at time of admission) Patient's cognitive ability adequate to safely complete daily activities?:  Yes Is the patient deaf or have difficulty hearing?: No Does the patient have difficulty seeing, even when wearing glasses/contacts?: No Does the patient have difficulty concentrating, remembering, or making decisions?: No Patient able to express need for assistance with ADLs?: Yes Does the patient have difficulty dressing or bathing?: Yes Independently performs ADLs?: No Communication: Independent Dressing (OT): Needs assistance Is this a change from baseline?: Pre-admission baseline Grooming: Needs assistance Is this a change from baseline?: Pre-admission baseline Feeding: Independent Bathing: Needs assistance Is this a change from baseline?: Pre-admission baseline Toileting: Needs assistance Is this a change from baseline?: Pre-admission baseline In/Out Bed: Needs assistance Is this a change from baseline?: Pre-admission baseline Walks in Home: Needs assistance Is this a change from baseline?: Pre-admission baseline Does the patient have difficulty walking or climbing stairs?: Yes Weakness of Legs: Both Weakness of Arms/Hands: None  Permission Sought/Granted Permission sought to share information with : Case Manager, Customer service manager, Family Supports       Permission granted to share info w AGENCY: SNFs        Emotional Assessment Appearance:: Appears stated age Attitude/Demeanor/Rapport: Gracious Affect (typically observed): Calm Orientation: : Oriented to Self, Oriented to Place, Oriented to  Time, Oriented to Situation Alcohol / Substance Use: Not Applicable Psych Involvement: No (comment)  Admission diagnosis:  Fall [W19.XXXA] Acute cystitis with hematuria [N30.01] Fall, initial encounter [W19.XXXA] Symptomatic anemia [D64.9] Anemia, unspecified type [D64.9]  Patient Active Problem List   Diagnosis Date Noted  . Cardiac pacemaker in situ 10/29/2018  . Major depressive disorder, recurrent severe without psychotic features ([place]) 04/15/2018  .  Atrial fibrillation with RVR ([place]) 11/08/2017  . HTN (hypertension) 05/21/2017  . Diabetes mellitus, type II ([place]) 05/21/2017  . Diastolic CHF, chronic ([place]) 05/21/2017  . COPD (chronic obstructive pulmonary disease) ([place]) 05/21/2017  . H/O atrial flutter 05/21/2017  . Heme positive stool   . Symptomatic anemia 07/10/2016  . Cardiac syncope 05/15/2015  . Cervical disc disease 05/15/2015  . Anemia 05/15/2015  . History of CVA (cerebrovascular accident) 04/03/2014  . Benign neoplasm of rectum and anal canal 12/02/2013  . Tobacco use disorder 11/30/2013  . Mixed hyperlipidemia 06/06/2013  . Type 2 diabetes mellitus ([place]) 01/22/2013  . Obesity (BMI 30-39.9) 12/14/2012  . Persistent atrial fibrillation ([place]) 10/07/2010  . Atrial flutter - status post CTI ablation 07/29/2010  . Essential hypertension 07/29/2010  . Chronic diastolic congestive heart failure ([place]) 07/29/2010  . Atherosclerotic heart disease of native coronary artery with other forms of angina pectoris ([place]) 07/29/2010   PCP:  Nicolette Bang, DO Pharmacy:   [id number] [place] [place] [address]Phone: [phone]: [phone]    Social Determinants of Health (SDOH) Interventions    Readmission Risk Interventions No flowsheet data found.

## 2019-07-30 NOTE — Evaluation (Signed)
Occupational Therapy Evaluation Patient Details Name: [name] MRN: [id number] DOB: 12/02/1940 Today's Date: 07/30/2019    History of Present Illness 79 yo male admitted to ED on 5/3 with weakess, falls x4 in 48 hours; pt recovering from UTI. Pt with heme + stool, head/neck/hip imaging negative for acute findings s/p falls. PMH includes anemia, OA, balance impairment, CAP, CAD, cervical radiculopathy, COPD, DM, GERD, CVA, L eye legal blindness, afib, pacemaker.   Clinical Impression   Pt PTA: Pt living with children and reports independence prior. Pt currently performing ADL tasks with modA and minA for mobility. Pt fatigues easily. Pt limited my decreased activity tolerance, decreased strength and decreased ability to care for self. O2 on RA; HR <90 BPM through exertion. Pt would benefit from continued OT skilled services. OT following acutely.      Follow Up Recommendations  SNF    Equipment Recommendations  Other (comment)(defer to facility)    Recommendations for Other Services       Precautions / Restrictions Precautions Precautions: Fall;ICD/Pacemaker Restrictions Weight Bearing Restrictions: No      Mobility Bed Mobility               General bed mobility comments: in recliner pre and post session  Transfers Overall transfer level: Needs assistance Equipment used: Rolling walker (2 wheeled) Transfers: Sit to/from Stand Sit to Stand: Min assist;From elevated surface         General transfer comment: minA for power up    Balance Overall balance assessment: Needs assistance;History of Falls   Sitting balance-Leahy Scale: Good       Standing balance-Leahy Scale: Poor Standing balance comment: reliant on use of RW                           ADL either performed or assessed with clinical judgement   ADL Overall ADL's : Needs assistance/impaired Eating/Feeding: Modified independent;Sitting   Grooming: Set up;Sitting   Upper Body  Bathing: Set up;Sitting   Lower Body Bathing: Moderate assistance;Sitting/lateral leans;Sit to/from stand;Cueing for safety   Upper Body Dressing : Set up;Sitting   Lower Body Dressing: Moderate assistance;Cueing for safety;Sitting/lateral leans;Sit to/from stand   Toilet Transfer: Minimal assistance;Ambulation;RW   Toileting- Clothing Manipulation and Hygiene: Moderate assistance;Cueing for safety;Sit to/from stand;Sitting/lateral lean       Functional mobility during ADLs: Minimal assistance;Rolling walker;Cueing for safety;Cueing for sequencing General ADL Comments: Pt limited my decreased activity tolerance, decreased strength and decreased ability to care for self.     Vision Baseline Vision/History: Legally blind(L eye) Patient Visual Report: No change from baseline Vision Assessment?: No apparent visual deficits     Perception     Praxis      Pertinent Vitals/Pain Pain Assessment: No/denies pain     Hand Dominance Right   Extremity/Trunk Assessment Upper Extremity Assessment Upper Extremity Assessment: Generalized weakness   Lower Extremity Assessment Lower Extremity Assessment: Generalized weakness   Cervical / Trunk Assessment Cervical / Trunk Assessment: Normal   Communication Communication Communication: No difficulties   Cognition Arousal/Alertness: Awake/alert Behavior During Therapy: WFL for tasks assessed/performed Overall Cognitive Status: Within Functional Limits for tasks assessed Area of Impairment: Problem solving;Safety/judgement                         Safety/Judgement: Decreased awareness of safety   Problem Solving: Slow processing General Comments: Followed all commands; cues for problem solving through ADL tasks.   General  Comments  O2 on RA; HR <90 BPM through exertion.    Exercises     Shoulder Instructions      Home Living Family/patient expects to be discharged to:: Private residence Living Arrangements:  Children Available Help at Discharge: Family;Available PRN/intermittently Type of Home: Mobile home Home Access: Stairs to enter Entrance Stairs-Number of Steps: 4 Entrance Stairs-Rails: Right;Left Home Layout: One level     Bathroom Shower/Tub: Teacher, early years/pre: Standard     Home Equipment: Environmental consultant - 4 wheels;Cane - single point;Shower seat;Grab bars - tub/shower;Grab bars - toilet   Additional Comments: reports he mainly used a cane prior to admission      Prior Functioning/Environment Level of Independence: Independent with assistive device(s)        Comments: pt reports using cane or rollator for ambulation, but states "I wasn't moving that much, I kept falling" PTA. Pt states his family does not help him with anything, and states they work all day so he is home alone.        OT Problem List: Decreased strength;Decreased activity tolerance;Impaired balance (sitting and/or standing);Decreased safety awareness;Pain;Increased edema;Decreased knowledge of use of DME or AE      OT Treatment/Interventions: Self-care/ADL training;Therapeutic exercise;Energy conservation;DME and/or AE instruction;Therapeutic activities;Patient/family education;Balance training    OT Goals(Current goals can be found in the care plan section) Acute Rehab OT Goals Patient Stated Goal: get stronger at rehab OT Goal Formulation: With patient Time For Goal Achievement: 08/13/19 Potential to Achieve Goals: Good ADL Goals Pt Will Perform Grooming: with supervision;standing Pt Will Perform Lower Body Dressing: with supervision;sitting/lateral leans;sit to/from stand Pt/caregiver will Perform Home Exercise Program: Increased strength;With Supervision Additional ADL Goal #1: Pt will state/utilize 2 energy conservation techniques for ADL/mobility for post acute care. Additional ADL Goal #2: Pt will tolerate x10 mins of OOB ADL wth 2 seated rest breaks with supervisionA.  OT Frequency: Min  2X/week   Barriers to D/C:            Co-evaluation              AM-PAC OT "6 Clicks" Daily Activity     Outcome Measure Help from another person eating meals?: None Help from another person taking care of personal grooming?: A Little Help from another person toileting, which includes using toliet, bedpan, or urinal?: A Lot Help from another person bathing (including washing, rinsing, drying)?: A Lot Help from another person to put on and taking off regular upper body clothing?: A Little Help from another person to put on and taking off regular lower body clothing?: A Lot 6 Click Score: 16   End of Session Equipment Utilized During Treatment: Gait belt;Rolling walker Nurse Communication: Mobility status  Activity Tolerance: Patient tolerated treatment well;Patient limited by pain Patient left: in chair;with call bell/phone within reach;with chair alarm set  OT Visit Diagnosis: Unsteadiness on feet (R26.81);Muscle weakness (generalized) (M62.81);Pain Pain - part of body: Knee(b/l)                Time: [id number] OT Time Calculation (min): 32 min Charges:  OT General Charges $OT Visit: 1 Visit OT Evaluation $OT Eval Moderate Complexity: 1 Mod OT Treatments $Self Care/Home Management : 8-22 mins  Jefferey Pica, OTR/L Acute Rehabilitation Services Pager: [phone] Office: [phone]   Scotland Korver C 07/30/2019, 5:08 PM

## 2019-07-31 DIAGNOSIS — R195 Other fecal abnormalities: Secondary | ICD-10-CM

## 2019-07-31 LAB — BASIC METABOLIC PANEL
Anion gap: 9 (ref 5–15)
BUN: 19 mg/dL (ref 8–23)
CO2: 20 mmol/L — ABNORMAL LOW (ref 22–32)
Calcium: 8.9 mg/dL (ref 8.9–10.3)
Chloride: 107 mmol/L (ref 98–111)
Creatinine, Ser: 1.53 mg/dL — ABNORMAL HIGH (ref 0.61–1.24)
GFR calc Af Amer: 49 mL/min — ABNORMAL LOW (ref >60)
GFR calc non Af Amer: 42 mL/min — ABNORMAL LOW (ref >60)
Glucose, Bld: 147 mg/dL — ABNORMAL HIGH (ref 70–99)
Potassium: 4.2 mmol/L (ref 3.5–5.1)
Sodium: 136 mmol/L (ref 135–145)

## 2019-07-31 LAB — GLUCOSE, CAPILLARY
Glucose-Capillary: 160 mg/dL — ABNORMAL HIGH (ref 70–99)
Glucose-Capillary: 211 mg/dL — ABNORMAL HIGH (ref 70–99)
Glucose-Capillary: 192 mg/dL — ABNORMAL HIGH (ref 70–99)
Glucose-Capillary: 252 mg/dL — ABNORMAL HIGH (ref 70–99)

## 2019-07-31 NOTE — Plan of Care (Signed)
  Problem: Education: Goal: Knowledge of General Education information will improve Description: Including pain rating scale, medication(s)/side effects and non-pharmacologic comfort measures 07/31/2019 1648 by Azucena Fallen, RN Outcome: Progressing 07/31/2019 1621 by Azucena Fallen, RN Outcome: Progressing

## 2019-07-31 NOTE — Discharge Summary (Addendum)
Physician Discharge Summary  [name] [id number] DOB: 05-06-1940 DOA: 07/28/2019  PCP: Nicolette Bang, DO  Admit date: 07/28/2019 Discharge date: 08/01/2019  Admitted From: SNF Disposition:  SNF  Recommendations for Outpatient Follow-up:  1. Follow up with PCP in 1-2 weeks 2. Please obtain BMP/CBC in one week 3. Palliative of care to meet with family for goals of care at facility..   Home Health:No Equipment/Devices:None  Discharge Condition:Stable CODE STATUS:Full Diet recommendation: Heart Healthy   Brief/Interim Summary: 79 y.o. male past medical history significant for allergic rhinitis, chronic normocytic anemia atrial flutter/atrial fibrillation not on anticoagulation due to history of GI bleed, chronic diastolic heart failure, COPD, diabetes mellitus type 2 nonhemorrhagic CVA, also history of peptic ulcer disease legally blind comes in to the ED with progressive weakness associated with multiple falls dyspnea cough and lower extremity bilateral edema in the ED was found to have a hemoglobin of 7 FOBT positive was given a unit of packed red blood cells  Discharge Diagnoses:  Principal Problem:   Symptomatic anemia Active Problems:   Essential hypertension   Persistent atrial fibrillation (HCC)   Mixed hyperlipidemia   Heme positive stool   Diabetes mellitus, type II (HCC)   Diastolic CHF, chronic (HCC)   COPD (chronic obstructive pulmonary disease) (HCC)  Generalized weakness and fall probably due to symptomatic iron deficiency anemia: In the ED his hemoglobin was found to be at 7 FOBT positive he was transfused 1 unit of packed red blood cells his ferritin was 12 he was started on ferrous sulfate which she will continue as an outpatient continue PPI avoid NSAIDs.  Hypotension/dehydration: He is only on diltiazem at home his blood pressure improved after IV fluids and packed red blood cells transfusion.  Permanent atrial fibrillation: Rate control not  on anticoagulation due to GI bleed.  Hypokalemia: Repleted orally now resolved.  Hypomagnesemia: Repleted IV now resolved.  Diabetes mellitus type 2 with hyperglycemia: No changes made to his medication.  Essential hypertension: No change made to his medication.  Discharge Instructions  Discharge Instructions    Diet - low sodium heart healthy   Complete by: As directed    Increase activity slowly   Complete by: As directed      Allergies as of 07/31/2019      Reactions   Cyclobenzaprine Other (See Comments)   Unsteady gait   Other Other (See Comments)   Steroids speeds his heart rate      Medication List    TAKE these medications   Accu-Chek FastClix Lancets Misc USE TO TEST BLOOD SUGAR TWICE DAILY. E11.00   Accu-Chek Guide test strip Generic drug: glucose blood USE TO TEST BLOOD SUGAR TWICE DAILY. E11.00   aspirin EC 81 MG tablet Take 81 mg by mouth daily.   citalopram 20 MG tablet Commonly known as: CELEXA Take 1 tablet (20 mg total) by mouth daily.   diltiazem 120 MG 24 hr capsule Commonly known as: CARDIZEM CD Take 1 capsule (120 mg total) by mouth 2 (two) times daily.   ferrous sulfate 325 (65 FE) MG tablet Take 325 mg by mouth daily with breakfast.   gabapentin 100 MG capsule Commonly known as: NEURONTIN Take 1 capsule (100 mg total) by mouth at bedtime.   glimepiride 2 MG tablet Commonly known as: AMARYL Take 0.5 tablets (1 mg total) by mouth daily before breakfast.   nitroGLYCERIN 0.4 MG SL tablet Commonly known as: NITROSTAT Place 1 tablet (0.4 mg total) under the tongue every 5 (  five) minutes x 3 doses as needed for chest pain.   pantoprazole 40 MG tablet Commonly known as: PROTONIX Take 1 tablet (40 mg total) by mouth 2 (two) times daily before a meal. Dx: K21.9   pravastatin 40 MG tablet Commonly known as: PRAVACHOL Take 1 tablet (40 mg total) by mouth every evening.   tamsulosin 0.4 MG Caps capsule Commonly known as: FLOMAX Take  1 capsule (0.4 mg total) by mouth daily.       Allergies  Allergen Reactions  . Cyclobenzaprine Other (See Comments)    Unsteady gait  . Other Other (See Comments)    Steroids speeds his heart rate    Consultations:  None   Procedures/Studies: DG Ribs Unilateral W/Chest Left  Result Date: 07/28/2019 CLINICAL DATA:  Multiple recent falls.  Left hip, rib and chest pain EXAM: LEFT RIBS AND CHEST - 3+ VIEW COMPARISON:  None. FINDINGS: No fracture or other bone lesions are seen involving the ribs. There is no evidence of pneumothorax or pleural effusion. Mild bilateral interstitial thickening similar in appearance to the prior exams. No focal consolidation, pleural effusion or pneumothorax. Heart size and mediastinal contours are within normal limits. Dual lead cardiac pacemaker. IMPRESSION: Negative. Electronically Signed   By: Kathreen Devoid   On: 07/28/2019 17:06   DG Abd 1 View  Result Date: 07/28/2019 CLINICAL DATA:  Multiple recent falls.  Left hip rib and chest pain EXAM: ABDOMEN - 1 VIEW COMPARISON:  None. FINDINGS: Prior cholecystectomy. Nonobstructive bowel gas pattern. No organomegaly or free air. Degenerative changes within the lumbar spine. No acute bony abnormality. IMPRESSION: No acute findings. Electronically Signed   By: Rolm Baptise M.D.   On: 07/28/2019 17:33   CT Head Wo Contrast  Result Date: 07/28/2019 CLINICAL DATA:  79 year old male with trauma. EXAM: CT HEAD WITHOUT CONTRAST CT CERVICAL SPINE WITHOUT CONTRAST TECHNIQUE: Multidetector CT imaging of the head and cervical spine was performed following the standard protocol without intravenous contrast. Multiplanar CT image reconstructions of the cervical spine were also generated. COMPARISON:  CT dated 11/14/2018. FINDINGS: CT HEAD FINDINGS Brain: Moderate age-related atrophy and chronic microvascular ischemic changes. There is no acute intracranial hemorrhage. No mass effect or midline shift. No extra-axial fluid collection.  Vascular: No hyperdense vessel or unexpected calcification. Skull: Normal. Negative for fracture or focal lesion. Sinuses/Orbits: No acute finding. Other: None. CT CERVICAL SPINE FINDINGS Alignment: No acute subluxation. There is straightening of normal cervical lordosis which may be positional or due to muscle spasm or chronic. Skull base and vertebrae: No acute fracture. 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 There has been interval increase in the distraction gap since the prior CT. Soft tissues and spinal canal: No prevertebral fluid or swelling. No visible canal hematoma. Disc levels:  Multilevel degenerative changes with bone spurring. Upper chest: Negative. Other: Partially visualized pacemaker wire. Bilateral carotid bulb calcified plaques. Probable subcutaneous lipoma of the posterior neck. IMPRESSION: 1. No acute intracranial pathology. Moderate age-related atrophy and chronic microvascular ischemic changes. 2. No acute/traumatic cervical spine pathology. Old fracture of the dens with nonunion and increased distraction. Electronically Signed   By: Anner Crete M.D.   On: 07/28/2019 21:54   CT Chest W Contrast  Result Date: 07/28/2019 CLINICAL DATA:  79 year old male with trauma. Left-sided chest pain. EXAM: CT CHEST, ABDOMEN, AND PELVIS WITH CONTRAST TECHNIQUE: Multidetector CT imaging of the chest, abdomen and pelvis was performed following the standard protocol during bolus administration of intravenous contrast.  CONTRAST:  170mL OMNIPAQUE IOHEXOL 350 MG/ML SOLN COMPARISON:  Chest radiograph dated 11/14/2018. FINDINGS: CT CHEST FINDINGS Cardiovascular: There is no cardiomegaly or pericardial effusion. There is 3 vessel coronary vascular calcification. Left-sided pacemaker. There is moderate atherosclerotic calcification of the thoracic aorta. No aneurysmal dilatation or dissection. The origins of the great vessels of the aortic arch appear patent as visualized. The central  pulmonary arteries are grossly unremarkable. Mediastinum/Nodes: There is no hilar or mediastinal adenopathy. The esophagus and the thyroid gland are grossly unremarkable. No mediastinal fluid collection or hematoma. Lungs/Pleura: The lungs are clear. There is a 4 mm right middle lobe calcified granuloma. There is no pleural effusion or pneumothorax. The central airways are patent. Musculoskeletal: No acute osseous pathology. Degenerative changes of the spine. CT ABDOMEN PELVIS FINDINGS No intra-abdominal free air. Small free fluid within the pelvis. Hepatobiliary: Fatty infiltration of the liver with morphologic changes of early cirrhosis. Cholecystectomy. Pancreas: Mild stranding of the peripancreatic fat may be related to underlying liver disease. Correlation with pancreatic enzymes recommended to exclude pancreatitis. Spleen: Normal in size without focal abnormality. Adrenals/Urinary Tract: The adrenal glands are unremarkable. There is no hydronephrosis on either side. There is symmetric enhancement and excretion of contrast by both kidneys. The visualized ureters appear unremarkable. The urinary bladder is decompressed around a Foley catheter. Two adjacent calcific foci with the larger measuring 4 mm along the bladder wall adjacent to the balloon of the Foley catheter may represent focal calcification of the bladder wall or stone within the bladder. Stomach/Bowel: Postsurgical changes of the gastroesophageal junction. There is postsurgical changes of the bowel. There is moderate stool throughout the colon. There are scattered colonic diverticula without active inflammatory changes. There is no bowel obstruction or active inflammation. The appendix is normal. Vascular/Lymphatic: Advanced aortoiliac atherosclerotic disease. There is a 2.5 cm infrarenal aortic ectasia. The IVC is unremarkable. No portal venous gas. There is no adenopathy. Reproductive: The prostate is suboptimally visualized. Other: Mild diffuse  subcutaneous edema. Musculoskeletal: Osteopenia with degenerative changes of the spine. No acute osseous pathology. IMPRESSION: 1. No traumatic intrathoracic, abdominal, or pelvic pathology. 2. Fatty liver with morphologic changes of early cirrhosis. 3. Mild stranding of the peripancreatic fat may be related to underlying liver disease. Correlation with pancreatic enzymes recommended to exclude pancreatitis. 4. Colonic diverticulosis. No bowel obstruction. Normal appendix. 5. Aortic Atherosclerosis (ICD10-I70.0). Electronically Signed   By: Anner Crete M.D.   On: 07/28/2019 22:19   CT Cervical Spine Wo Contrast  Result Date: 07/28/2019 CLINICAL DATA:  79 year old male with trauma. EXAM: CT HEAD WITHOUT CONTRAST CT CERVICAL SPINE WITHOUT CONTRAST TECHNIQUE: Multidetector CT imaging of the head and cervical spine was performed following the standard protocol without intravenous contrast. Multiplanar CT image reconstructions of the cervical spine were also generated. COMPARISON:  CT dated 11/14/2018. FINDINGS: CT HEAD FINDINGS Brain: Moderate age-related atrophy and chronic microvascular ischemic changes. There is no acute intracranial hemorrhage. No mass effect or midline shift. No extra-axial fluid collection. Vascular: No hyperdense vessel or unexpected calcification. Skull: Normal. Negative for fracture or focal lesion. Sinuses/Orbits: No acute finding. Other: None. CT CERVICAL SPINE FINDINGS Alignment: No acute subluxation. There is straightening of normal cervical lordosis which may be positional or due to muscle spasm or chronic. Skull base and vertebrae: No acute fracture. 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 There has been interval increase in the distraction gap since the prior CT. Soft tissues and spinal canal: No prevertebral fluid or swelling. No visible  canal hematoma. Disc levels:  Multilevel degenerative changes with bone spurring. Upper chest: Negative. Other:  Partially visualized pacemaker wire. Bilateral carotid bulb calcified plaques. Probable subcutaneous lipoma of the posterior neck. IMPRESSION: 1. No acute intracranial pathology. Moderate age-related atrophy and chronic microvascular ischemic changes. 2. No acute/traumatic cervical spine pathology. Old fracture of the dens with nonunion and increased distraction. Electronically Signed   By: Anner Crete M.D.   On: 07/28/2019 21:54   CT ABDOMEN PELVIS W CONTRAST  Result Date: 07/28/2019 CLINICAL DATA:  79 year old male with trauma. Left-sided chest pain. EXAM: CT CHEST, ABDOMEN, AND PELVIS WITH CONTRAST TECHNIQUE: Multidetector CT imaging of the chest, abdomen and pelvis was performed following the standard protocol during bolus administration of intravenous contrast. CONTRAST:  137mL OMNIPAQUE IOHEXOL 350 MG/ML SOLN COMPARISON:  Chest radiograph dated 11/14/2018. FINDINGS: CT CHEST FINDINGS Cardiovascular: There is no cardiomegaly or pericardial effusion. There is 3 vessel coronary vascular calcification. Left-sided pacemaker. There is moderate atherosclerotic calcification of the thoracic aorta. No aneurysmal dilatation or dissection. The origins of the great vessels of the aortic arch appear patent as visualized. The central pulmonary arteries are grossly unremarkable. Mediastinum/Nodes: There is no hilar or mediastinal adenopathy. The esophagus and the thyroid gland are grossly unremarkable. No mediastinal fluid collection or hematoma. Lungs/Pleura: The lungs are clear. There is a 4 mm right middle lobe calcified granuloma. There is no pleural effusion or pneumothorax. The central airways are patent. Musculoskeletal: No acute osseous pathology. Degenerative changes of the spine. CT ABDOMEN PELVIS FINDINGS No intra-abdominal free air. Small free fluid within the pelvis. Hepatobiliary: Fatty infiltration of the liver with morphologic changes of early cirrhosis. Cholecystectomy. Pancreas: Mild stranding of  the peripancreatic fat may be related to underlying liver disease. Correlation with pancreatic enzymes recommended to exclude pancreatitis. Spleen: Normal in size without focal abnormality. Adrenals/Urinary Tract: The adrenal glands are unremarkable. There is no hydronephrosis on either side. There is symmetric enhancement and excretion of contrast by both kidneys. The visualized ureters appear unremarkable. The urinary bladder is decompressed around a Foley catheter. Two adjacent calcific foci with the larger measuring 4 mm along the bladder wall adjacent to the balloon of the Foley catheter may represent focal calcification of the bladder wall or stone within the bladder. Stomach/Bowel: Postsurgical changes of the gastroesophageal junction. There is postsurgical changes of the bowel. There is moderate stool throughout the colon. There are scattered colonic diverticula without active inflammatory changes. There is no bowel obstruction or active inflammation. The appendix is normal. Vascular/Lymphatic: Advanced aortoiliac atherosclerotic disease. There is a 2.5 cm infrarenal aortic ectasia. The IVC is unremarkable. No portal venous gas. There is no adenopathy. Reproductive: The prostate is suboptimally visualized. Other: Mild diffuse subcutaneous edema. Musculoskeletal: Osteopenia with degenerative changes of the spine. No acute osseous pathology. IMPRESSION: 1. No traumatic intrathoracic, abdominal, or pelvic pathology. 2. Fatty liver with morphologic changes of early cirrhosis. 3. Mild stranding of the peripancreatic fat may be related to underlying liver disease. Correlation with pancreatic enzymes recommended to exclude pancreatitis. 4. Colonic diverticulosis. No bowel obstruction. Normal appendix. 5. Aortic Atherosclerosis (ICD10-I70.0). Electronically Signed   By: Anner Crete M.D.   On: 07/28/2019 22:19   CUP PACEART REMOTE DEVICE CHECK  Result Date: 07/21/2019 Scheduled remote reviewed. Normal device  function.  55 AMS (84%). Known persistent AF, no OAC due to GIB. 4 new HVR episodes (longest 15 min 42 sec); appears AF/RVR. Next remote 91 days. Felisa Bonier, RN, MSN  DG Hip [place] or Wo  Pelvis 1 View Left  Result Date: 07/28/2019 CLINICAL DATA:  Fall, left hip pain EXAM: DG HIP (WITH OR WITHOUT PELVIS) 1V*L* COMPARISON:  04/15/2018 FINDINGS: Hip joints are symmetric. Joint spaces maintained. No acute bony abnormality. Specifically, no fracture, subluxation, or dislocation. IMPRESSION: No acute bony abnormality. Electronically Signed   By: Rolm Baptise M.D.   On: 07/28/2019 17:34    Subjective: No new complaints.  Discharge Exam: Vitals:   07/30/19 1948 07/31/19 0433  BP: 124/73 (!) 144/64  Pulse: 89 97  Resp: 16 20  Temp: 98.2 F (36.8 C) 98.1 F (36.7 C)  SpO2: 97% 98%   Vitals:   07/30/19 1132 07/30/19 1605 07/30/19 1948 07/31/19 0433  BP: (!) 113/56 110/68 124/73 (!) 144/64  Pulse: 86 87 89 97  Resp: 18 19 16 20   Temp: (!) 97.5 F (36.4 C) 98 F (36.7 C) 98.2 F (36.8 C) 98.1 F (36.7 C)  TempSrc: Oral Oral Oral Oral  SpO2: 99% 99% 97% 98%  Weight:    77 kg  Height:        General: Pt is alert, awake, not in acute distress Cardiovascular: RRR, S1/S2 +, no rubs, no gallops Respiratory: CTA bilaterally, no wheezing, no rhonchi Abdominal: Soft, NT, ND, bowel sounds + Extremities: no edema, no cyanosis    The results of significant diagnostics from this hospitalization (including imaging, microbiology, ancillary and laboratory) are listed below for reference.     Microbiology: Recent Results (from the past 240 hour(s))  Urine culture     Status: Abnormal   Collection Time: 07/28/19  4:34 PM   Specimen: Urine, Random  Result Value Ref Range Status   Specimen Description URINE, RANDOM  Final   Special Requests   Final    NONE Performed at [facility], [address] [place], [place] [address]    Culture MULTIPLE SPECIES PRESENT, SUGGEST RECOLLECTION  (A)  Final   Report Status 07/29/2019 FINAL  Final  Respiratory Panel by RT PCR (Flu A&B, Covid) - Nasopharyngeal Swab     Status: None   Collection Time: 07/28/19  8:20 PM   Specimen: Nasopharyngeal Swab  Result Value Ref Range Status   SARS Coronavirus 2 by RT PCR NEGATIVE NEGATIVE Final    Comment: (NOTE) SARS-CoV-2 target nucleic acids are NOT DETECTED. The SARS-CoV-2 RNA is generally detectable in upper respiratoy specimens during the acute phase of infection. The lowest concentration of SARS-CoV-2 viral copies this assay can detect is 131 copies/mL. A negative result does not preclude SARS-Cov-2 infection and should not be used as the sole basis for treatment or other patient management decisions. A negative result may occur with  improper specimen collection/handling, submission of specimen other than nasopharyngeal swab, presence of viral mutation(s) within the areas targeted by this assay, and inadequate number of viral copies (<131 copies/mL). A negative result must be combined with clinical observations, patient history, and epidemiological information. The expected result is Negative. Fact Sheet for Patients:  PinkCheek.be Fact Sheet for Healthcare Providers:  GravelBags.it This test is not yet ap proved or cleared by the [place] FDA and  has been authorized for detection and/or diagnosis of SARS-CoV-2 by FDA under an Emergency Use Authorization (EUA). This EUA will remain  in effect (meaning this test can be used) for the duration of the COVID-19 declaration under Section 564(b)(1) of the Act, 21 U.S.C. section 360bbb-3(b)(1), unless the authorization is terminated or revoked sooner.    Influenza A by PCR NEGATIVE NEGATIVE  Final   Influenza B by PCR NEGATIVE NEGATIVE Final    Comment: (NOTE) The Xpert Xpress SARS-CoV-2/FLU/RSV assay is intended as an aid in  the diagnosis of influenza from Nasopharyngeal  swab specimens and  should not be used as a sole basis for treatment. Nasal washings and  aspirates are unacceptable for Xpert Xpress SARS-CoV-2/FLU/RSV  testing. Fact Sheet for Patients: PinkCheek.be Fact Sheet for Healthcare Providers: GravelBags.it This test is not yet approved or cleared by the [place] FDA and  has been authorized for detection and/or diagnosis of SARS-CoV-2 by  FDA under an Emergency Use Authorization (EUA). This EUA will remain  in effect (meaning this test can be used) for the duration of the  Covid-19 declaration under Section 564(b)(1) of the Act, 21  U.S.C. section 360bbb-3(b)(1), unless the authorization is  terminated or revoked. Performed at [facility], [place] [address], [place], [place] [address]      Labs: BNP (last 3 results) No results for input(s): BNP in the last 8760 hours. Basic Metabolic Panel: Recent Labs  Lab 07/28/19 1634 07/29/19 0525 07/30/19 1143 07/31/19 0453  NA 137 138 134* 136  K 3.8 3.3* 4.2 4.2  CL 106 105 104 107  CO2 21* 20* 19* 20*  GLUCOSE 192* 158* 196* 147*  BUN 21 18 20 19   CREATININE 1.38* 1.33* 1.47* 1.53*  CALCIUM 9.2 9.3 8.6* 8.9  MG  --  1.7  --   --   PHOS  --  3.0  --   --    Liver Function Tests: Recent Labs  Lab 07/28/19 1634 07/29/19 0525  AST 39 44*  ALT 29 31  ALKPHOS 138* 133*  BILITOT 0.8 1.3*  PROT 8.9* 8.8*  ALBUMIN 2.6* 2.5*   Recent Labs  Lab 07/28/19 1634  LIPASE 36   No results for input(s): AMMONIA in the last 168 hours. CBC: Recent Labs  Lab 07/28/19 1634 07/29/19 0525 07/30/19 1000  WBC 3.2* 8.0 4.3  NEUTROABS 1.9  --   --   HGB 7.6* 9.2* 8.9*  HCT 24.5* 30.2* 29.2*  MCV 84.5 85.1 84.9  PLT PLATELET CLUMPS NOTED ON SMEAR, UNABLE TO ESTIMATE 122* 133*   Cardiac Enzymes: No results for input(s): CKTOTAL, CKMB, CKMBINDEX, TROPONINI in the last 168 hours. BNP: Invalid input(s): POCBNP CBG: Recent  Labs  Lab 07/30/19 0648 07/30/19 1133 07/30/19 1606 07/30/19 2112 07/31/19 0615  GLUCAP 116* 188* 177* 136* 160*   D-Dimer No results for input(s): DDIMER in the last 72 hours. Hgb A1c Recent Labs    07/29/19 0525  HGBA1C 8.0*   Lipid Profile No results for input(s): CHOL, HDL, LDLCALC, TRIG, CHOLHDL, LDLDIRECT in the last 72 hours. Thyroid function studies No results for input(s): TSH, T4TOTAL, T3FREE, THYROIDAB in the last 72 hours.  Invalid input(s): FREET3 Anemia work up Recent Labs    07/28/19 2020  VITAMINB12 718  FOLATE 21.8  FERRITIN 12*  TIBC 473*  IRON 19*  RETICCTPCT 2.1   Urinalysis    Component Value Date/Time   COLORURINE YELLOW 07/28/2019 [place] 07/28/2019 1634   LABSPEC 1.009 07/28/2019 1634   PHURINE 5.0 07/28/2019 1634   GLUCOSEU NEGATIVE 07/28/2019 1634   HGBUR SMALL (A) 07/28/2019 1634   BILIRUBINUR NEGATIVE 07/28/2019 1634   BILIRUBINUR negative 10/02/2018 0915   KETONESUR NEGATIVE 07/28/2019 1634   PROTEINUR NEGATIVE 07/28/2019 1634   UROBILINOGEN 0.2 10/02/2018 0915   UROBILINOGEN 0.2 10/09/2014 2315   NITRITE POSITIVE (A)  07/28/2019 1634   LEUKOCYTESUR TRACE (A) 07/28/2019 1634   Sepsis Labs Invalid input(s): PROCALCITONIN,  WBC,  LACTICIDVEN Microbiology Recent Results (from the past 240 hour(s))  Urine culture     Status: Abnormal   Collection Time: 07/28/19  4:34 PM   Specimen: Urine, Random  Result Value Ref Range Status   Specimen Description URINE, RANDOM  Final   Special Requests   Final    NONE Performed at [facility], [place] [address] [place], [place] [address]    Culture MULTIPLE SPECIES PRESENT, SUGGEST RECOLLECTION (A)  Final   Report Status 07/29/2019 FINAL  Final  Respiratory Panel by RT PCR (Flu A&B, Covid) - Nasopharyngeal Swab     Status: None   Collection Time: 07/28/19  8:20 PM   Specimen: Nasopharyngeal Swab  Result Value Ref Range Status   SARS Coronavirus 2 by RT PCR NEGATIVE  NEGATIVE Final    Comment: (NOTE) SARS-CoV-2 target nucleic acids are NOT DETECTED. The SARS-CoV-2 RNA is generally detectable in upper respiratoy specimens during the acute phase of infection. The lowest concentration of SARS-CoV-2 viral copies this assay can detect is 131 copies/mL. A negative result does not preclude SARS-Cov-2 infection and should not be used as the sole basis for treatment or other patient management decisions. A negative result may occur with  improper specimen collection/handling, submission of specimen other than nasopharyngeal swab, presence of viral mutation(s) within the areas targeted by this assay, and inadequate number of viral copies (<131 copies/mL). A negative result must be combined with clinical observations, patient history, and epidemiological information. The expected result is Negative. Fact Sheet for Patients:  PinkCheek.be Fact Sheet for Healthcare Providers:  GravelBags.it This test is not yet ap proved or cleared by the [place] FDA and  has been authorized for detection and/or diagnosis of SARS-CoV-2 by FDA under an Emergency Use Authorization (EUA). This EUA will remain  in effect (meaning this test can be used) for the duration of the COVID-19 declaration under Section 564(b)(1) of the Act, 21 U.S.C. section 360bbb-3(b)(1), unless the authorization is terminated or revoked sooner.    Influenza A by PCR NEGATIVE NEGATIVE Final   Influenza B by PCR NEGATIVE NEGATIVE Final    Comment: (NOTE) The Xpert Xpress SARS-CoV-2/FLU/RSV assay is intended as an aid in  the diagnosis of influenza from Nasopharyngeal swab specimens and  should not be used as a sole basis for treatment. Nasal washings and  aspirates are unacceptable for Xpert Xpress SARS-CoV-2/FLU/RSV  testing. Fact Sheet for Patients: PinkCheek.be Fact Sheet for Healthcare  Providers: GravelBags.it This test is not yet approved or cleared by the [place] FDA and  has been authorized for detection and/or diagnosis of SARS-CoV-2 by  FDA under an Emergency Use Authorization (EUA). This EUA will remain  in effect (meaning this test can be used) for the duration of the  Covid-19 declaration under Section 564(b)(1) of the Act, 21  U.S.C. section 360bbb-3(b)(1), unless the authorization is  terminated or revoked. Performed at [facility], [place] [address], [place], [place] [address]      Time coordinating discharge: Over 40 minutes  SIGNED:   Charlynne Cousins, MD  [facility] 07/31/2019, 8:03 AM Pager   If 7PM-7AM, please contact night-coverage [email or web address] Password [email or web address]

## 2019-07-31 NOTE — TOC Progression Note (Addendum)
Transition of Care [facility]) - Progression Note    Patient Details  Name: [name] MRN: [id number] Date of Birth: 08-18-1940  Transition of Care [facility]) CM/SW [place], [place] Phone Number: 07/31/2019, 9:12 AM  Clinical Narrative:     CSW spoke with [facility] admissions they are able to accept patient, however informed CSW patient only has 2 night stay in hospital and needs 3 night stay for Medicare SNF coverage, CSW has informed MD and patient. Unsafe dc home, safe dc plan recommendation is SNF.  Plan to dc to [facility] after 3 night stay.   Expected Discharge Plan: [place] Barriers to Discharge: Continued Medical Work up  Expected Discharge Plan and Services Expected Discharge Plan: [place]   Discharge Planning Services: CM Consult Post Acute Care Choice: [place] Living arrangements for the past 2 months: Single Family Home Expected Discharge Date: 07/31/19                                     Social Determinants of Health (SDOH) Interventions    Readmission Risk Interventions No flowsheet data found.

## 2019-07-31 NOTE — Plan of Care (Signed)
  Problem: Education: Goal: Knowledge of General Education information will improve Description Including pain rating scale, medication(s)/side effects and non-pharmacologic comfort measures Outcome: Progressing   

## 2019-08-01 DIAGNOSIS — Z803 Family history of malignant neoplasm of breast: Secondary | ICD-10-CM | POA: Diagnosis not present

## 2019-08-01 DIAGNOSIS — Z8249 Family history of ischemic heart disease and other diseases of the circulatory system: Secondary | ICD-10-CM | POA: Diagnosis not present

## 2019-08-01 DIAGNOSIS — J449 Chronic obstructive pulmonary disease, unspecified: Secondary | ICD-10-CM | POA: Diagnosis not present

## 2019-08-01 DIAGNOSIS — I469 Cardiac arrest, cause unspecified: Secondary | ICD-10-CM | POA: Diagnosis not present

## 2019-08-01 DIAGNOSIS — R1012 Left upper quadrant pain: Secondary | ICD-10-CM | POA: Diagnosis not present

## 2019-08-01 DIAGNOSIS — N3001 Acute cystitis with hematuria: Secondary | ICD-10-CM

## 2019-08-01 DIAGNOSIS — R55 Syncope and collapse: Secondary | ICD-10-CM | POA: Diagnosis not present

## 2019-08-01 DIAGNOSIS — Z8673 Personal history of transient ischemic attack (TIA), and cerebral infarction without residual deficits: Secondary | ICD-10-CM | POA: Diagnosis not present

## 2019-08-01 DIAGNOSIS — M6281 Muscle weakness (generalized): Secondary | ICD-10-CM | POA: Diagnosis not present

## 2019-08-01 DIAGNOSIS — J069 Acute upper respiratory infection, unspecified: Secondary | ICD-10-CM | POA: Diagnosis not present

## 2019-08-01 DIAGNOSIS — R2681 Unsteadiness on feet: Secondary | ICD-10-CM | POA: Diagnosis not present

## 2019-08-01 DIAGNOSIS — R52 Pain, unspecified: Secondary | ICD-10-CM | POA: Diagnosis not present

## 2019-08-01 DIAGNOSIS — E782 Mixed hyperlipidemia: Secondary | ICD-10-CM | POA: Diagnosis present

## 2019-08-01 DIAGNOSIS — K5730 Diverticulosis of large intestine without perforation or abscess without bleeding: Secondary | ICD-10-CM | POA: Diagnosis present

## 2019-08-01 DIAGNOSIS — Z950 Presence of cardiac pacemaker: Secondary | ICD-10-CM | POA: Diagnosis not present

## 2019-08-01 DIAGNOSIS — K5900 Constipation, unspecified: Secondary | ICD-10-CM | POA: Diagnosis present

## 2019-08-01 DIAGNOSIS — R0902 Hypoxemia: Secondary | ICD-10-CM | POA: Diagnosis not present

## 2019-08-01 DIAGNOSIS — R2689 Other abnormalities of gait and mobility: Secondary | ICD-10-CM | POA: Diagnosis not present

## 2019-08-01 DIAGNOSIS — I4821 Permanent atrial fibrillation: Secondary | ICD-10-CM | POA: Diagnosis present

## 2019-08-01 DIAGNOSIS — E1140 Type 2 diabetes mellitus with diabetic neuropathy, unspecified: Secondary | ICD-10-CM | POA: Diagnosis not present

## 2019-08-01 DIAGNOSIS — H548 Legal blindness, as defined in USA: Secondary | ICD-10-CM | POA: Diagnosis present

## 2019-08-01 DIAGNOSIS — J189 Pneumonia, unspecified organism: Secondary | ICD-10-CM | POA: Diagnosis not present

## 2019-08-01 DIAGNOSIS — F332 Major depressive disorder, recurrent severe without psychotic features: Secondary | ICD-10-CM | POA: Diagnosis present

## 2019-08-01 DIAGNOSIS — I25118 Atherosclerotic heart disease of native coronary artery with other forms of angina pectoris: Secondary | ICD-10-CM | POA: Diagnosis not present

## 2019-08-01 DIAGNOSIS — I69398 Other sequelae of cerebral infarction: Secondary | ICD-10-CM | POA: Diagnosis not present

## 2019-08-01 DIAGNOSIS — I10 Essential (primary) hypertension: Secondary | ICD-10-CM | POA: Diagnosis not present

## 2019-08-01 DIAGNOSIS — D649 Anemia, unspecified: Secondary | ICD-10-CM | POA: Diagnosis not present

## 2019-08-01 DIAGNOSIS — I4819 Other persistent atrial fibrillation: Secondary | ICD-10-CM | POA: Diagnosis not present

## 2019-08-01 DIAGNOSIS — Z20822 Contact with and (suspected) exposure to covid-19: Secondary | ICD-10-CM | POA: Diagnosis present

## 2019-08-01 DIAGNOSIS — K219 Gastro-esophageal reflux disease without esophagitis: Secondary | ICD-10-CM | POA: Diagnosis present

## 2019-08-01 DIAGNOSIS — L2084 Intrinsic (allergic) eczema: Secondary | ICD-10-CM | POA: Diagnosis present

## 2019-08-01 DIAGNOSIS — Z7401 Bed confinement status: Secondary | ICD-10-CM | POA: Diagnosis not present

## 2019-08-01 DIAGNOSIS — Z841 Family history of disorders of kidney and ureter: Secondary | ICD-10-CM | POA: Diagnosis not present

## 2019-08-01 DIAGNOSIS — Z806 Family history of leukemia: Secondary | ICD-10-CM | POA: Diagnosis not present

## 2019-08-01 DIAGNOSIS — I5032 Chronic diastolic (congestive) heart failure: Secondary | ICD-10-CM | POA: Diagnosis present

## 2019-08-01 DIAGNOSIS — R58 Hemorrhage, not elsewhere classified: Secondary | ICD-10-CM | POA: Diagnosis not present

## 2019-08-01 DIAGNOSIS — K859 Acute pancreatitis without necrosis or infection, unspecified: Secondary | ICD-10-CM | POA: Diagnosis not present

## 2019-08-01 DIAGNOSIS — I110 Hypertensive heart disease with heart failure: Secondary | ICD-10-CM | POA: Diagnosis present

## 2019-08-01 DIAGNOSIS — R1084 Generalized abdominal pain: Secondary | ICD-10-CM | POA: Diagnosis not present

## 2019-08-01 DIAGNOSIS — Z833 Family history of diabetes mellitus: Secondary | ICD-10-CM | POA: Diagnosis not present

## 2019-08-01 DIAGNOSIS — E611 Iron deficiency: Secondary | ICD-10-CM | POA: Diagnosis present

## 2019-08-01 DIAGNOSIS — D509 Iron deficiency anemia, unspecified: Secondary | ICD-10-CM | POA: Diagnosis not present

## 2019-08-01 DIAGNOSIS — M159 Polyosteoarthritis, unspecified: Secondary | ICD-10-CM | POA: Diagnosis present

## 2019-08-01 DIAGNOSIS — J440 Chronic obstructive pulmonary disease with acute lower respiratory infection: Secondary | ICD-10-CM | POA: Diagnosis present

## 2019-08-01 DIAGNOSIS — I2510 Atherosclerotic heart disease of native coronary artery without angina pectoris: Secondary | ICD-10-CM | POA: Diagnosis present

## 2019-08-01 DIAGNOSIS — R531 Weakness: Secondary | ICD-10-CM | POA: Diagnosis not present

## 2019-08-01 DIAGNOSIS — Z9181 History of falling: Secondary | ICD-10-CM | POA: Diagnosis not present

## 2019-08-01 DIAGNOSIS — E1165 Type 2 diabetes mellitus with hyperglycemia: Secondary | ICD-10-CM | POA: Diagnosis present

## 2019-08-01 DIAGNOSIS — M2550 Pain in unspecified joint: Secondary | ICD-10-CM | POA: Diagnosis not present

## 2019-08-01 LAB — GLUCOSE, CAPILLARY
Glucose-Capillary: 149 mg/dL — ABNORMAL HIGH (ref 70–99)
Glucose-Capillary: 287 mg/dL — ABNORMAL HIGH (ref 70–99)

## 2019-08-01 LAB — BASIC METABOLIC PANEL
Anion gap: 9 (ref 5–15)
BUN: 13 mg/dL (ref 8–23)
CO2: 21 mmol/L — ABNORMAL LOW (ref 22–32)
Calcium: 8.7 mg/dL — ABNORMAL LOW (ref 8.9–10.3)
Chloride: 105 mmol/L (ref 98–111)
Creatinine, Ser: 1.31 mg/dL — ABNORMAL HIGH (ref 0.61–1.24)
GFR calc Af Amer: 59 mL/min — ABNORMAL LOW (ref >60)
GFR calc non Af Amer: 51 mL/min — ABNORMAL LOW (ref >60)
Glucose, Bld: 138 mg/dL — ABNORMAL HIGH (ref 70–99)
Potassium: 3.9 mmol/L (ref 3.5–5.1)
Sodium: 135 mmol/L (ref 135–145)

## 2019-08-01 LAB — SARS CORONAVIRUS 2 (TAT 6-24 HRS): SARS Coronavirus 2: NEGATIVE

## 2019-08-01 NOTE — Progress Notes (Signed)
Called report to receiving nurse at [facility].  All patient belongs gathered and prepared to transport with patient.  IV removed, patient aware of transport to [facility]

## 2019-08-01 NOTE — Progress Notes (Signed)
[facility] PROGRESS NOTE    Progress Note  [name]  [id number] DOB: 1940/07/23 DOA: 07/28/2019 PCP: Nicolette Bang, DO     Brief Narrative:   [name] is an 79 y.o. male past medical history significant for allergic rhinitis, chronic normocytic anemia atrial flutter/atrial fibrillation not on anticoagulation due to history of GI bleed, chronic diastolic heart failure, COPD, diabetes mellitus type 2 nonhemorrhagic CVA, also history of peptic ulcer disease legally blind comes in to the ED with progressive weakness associated with multiple falls dyspnea cough and lower extremity bilateral edema in the ED was found to have a hemoglobin of 7 FOBT positive was given a unit of packed red blood cells  Assessment/Plan:   Generalized weakness and fall probably due to Symptomatic iron deficiency anemia: He is status post 1 unit of packed red blood cells his hemoglobin this morning is 9.2, his ferritin was 12 he was started on oral ferrous sulfate. No events overnight patient is awaiting placement to skilled nursing facility.  Hypotension/dehydration: No events overnight blood pressure stable.  Permanent atrial fibrillation: Rate controlled not on anticoagulation due to GI bleed.  Hyperkalemia: Was repleted orally basic metabolic panel is pending this morning.  Hypomagnesemia: Repleted IV at goal of magnesium is greater than 2.  Diabetes mellitus type 2 with hyperglycemia: With an A1c of 8. Continue to hold all oral hypoglycemic agents continue sliding scale  Essential hypertension Only on diltiazem keep a close eye on his blood pressure is borderline.  COPD (chronic obstructive pulmonary disease) (HCC) Stable, noted.    DVT prophylaxis: lovenox Family Communication:none Status is: Inpatient  Remains inpatient appropriate because:Hemodynamically unstable   Dispo: The patient is from: SNF              Anticipated d/c is to: SNF  Anticipated d/c date is: 1 day              Patient currently stable for discharge awaiting placement.         Code Status:     Code Status Orders  (From admission, onward)         Start     Ordered   07/29/19 0020  Full code  Continuous     07/29/19 0020        Code Status History    Date Active Date Inactive Code Status Order ID Comments User Context   10/19/2018 2344 10/23/2018 1849 Full Code [id number]  Bonnell Public, MD Inpatient   04/14/2018 1857 04/15/2018 1721 Full Code [id number]  Street, [place], [place] ED   01/18/2018 1703 01/20/2018 1446 Full Code [id number]  Baldwin Jamaica, PA-C ED   11/12/2017 1702 11/16/2017 1919 Full Code [id number]  Rory Percy, DO ED   11/08/2017 1835 11/10/2017 1746 Full Code [id number]  Rory Percy, DO Inpatient   10/04/2017 1536 10/06/2017 1538 Full Code [id number]  Lady Deutscher, MD ED   05/21/2017 1834 05/22/2017 1853 Full Code [id number]  Samella Parr, NP ED   11/28/2016 2346 11/30/2016 2054 Full Code [id number]  Karmen Bongo, MD Inpatient   10/22/2016 2340 10/23/2016 1614 Full Code [id number]  Rise Patience, MD Inpatient   10/01/2016 2027 10/03/2016 2123 Full Code [id number]  Merton Border, MD Inpatient   07/10/2016 1548 07/12/2016 1702 Full Code [id number]  Florinda Marker, MD Inpatient   05/15/2015 1848 05/17/2015 1740 Full Code [id number]  Samella Parr, NP Inpatient   03/10/2015 1919 03/11/2015 2012 Full Code [id number]  Cristal Ford, DO Inpatient  09/18/2014 1200 09/24/2014 1629 Full Code [id number]  Oswald Hillock, MD Inpatient   02/15/2014 2115 02/17/2014 1815 Full Code [id number]  Corky Sox, MD Inpatient   01/08/2014 1417 01/09/2014 1547 Full Code [id number]  Francesca Oman, DO Inpatient   11/30/2013 0623 12/02/2013 1821 Full Code [id number]  Theressa Millard, MD Inpatient   07/21/2013 1904 07/22/2013 2057 Full Code [id number]  Crista Luria Inpatient   01/22/2013 2224 01/25/2013 1313 Full Code [id number]  Rise Patience, MD Inpatient   12/04/2011 [phone] 12/05/2011 1948 Full Code [id number]  Phillips Climes, MD ED   Advance Care Planning Activity    Advance Directive Documentation     Most Recent Value  Type of Advance Directive  Healthcare Power of Attorney  Pre-existing out of facility DNR order (yellow form or pink MOST form)  -  "MOST" Form in Place?  -        IV Access:    Peripheral IV   Procedures and diagnostic studies:   No results found.   Medical Consultants:    None.  Anti-Infectives:   none  Subjective:    [name] no new complaints he relates he ready go  Objective:    Vitals:   07/31/19 1637 07/31/19 2107 08/01/19 0059 08/01/19 0517  BP: 137/81 124/67  114/67  Pulse: 87 94  91  Resp: 16 20  16   Temp: 98.2 F (36.8 C) 97.9 F (36.6 C)  98.7 F (37.1 C)  TempSrc: Oral Oral  Oral  SpO2: 97% 98%  96%  Weight:   75.3 kg   Height:       SpO2: 96 % O2 Flow Rate (L/min): 0 L/min   Intake/Output Summary (Last 24 hours) at 08/01/2019 0957 Last data filed at 08/01/2019 0909 Gross per 24 hour  Intake 1180 ml  Output 2125 ml  Net -945 ml   Filed Weights   07/30/19 0149 07/31/19 0433 08/01/19 0059  Weight: 75.8 kg 77 kg 75.3 kg    Exam: General exam: In no acute distress. Respiratory system: Good air movement and clear to auscultation. Cardiovascular system: S1 & S2 heard, RRR. No JVD. Gastrointestinal system: Abdomen is nondistended, soft and nontender.  Extremities: No pedal edema. Skin: No rashes, lesions or ulcers  Data Reviewed:    Labs: Basic Metabolic Panel: Recent Labs  Lab 07/28/19 1634 07/28/19 1634 07/29/19 0525 07/29/19 0525 07/30/19 1143 07/30/19 1143 07/31/19 0453 08/01/19 0737  NA 137  --  138  --  134*  --  136 135  K 3.8   < > 3.3*   < > 4.2   < > 4.2 3.9  CL 106  --  105  --  104  --  107 105  CO2 21*  --  20*  --  19*  --  20* 21*  GLUCOSE 192*  --  158*  --  196*  --  147* 138*  BUN 21  --  18  --  20  --   19 13  CREATININE 1.38*  --  1.33*  --  1.47*  --  1.53* 1.31*  CALCIUM 9.2  --  9.3  --  8.6*  --  8.9 8.7*  MG  --   --  1.7  --   --   --   --   --   PHOS  --   --  3.0  --   --   --   --   --    < > =  values in this interval not displayed.   GFR Estimated Creatinine Clearance: 40.6 mL/min (A) (by C-G formula based on SCr of 1.31 mg/dL (H)). Liver Function Tests: Recent Labs  Lab 07/28/19 1634 07/29/19 0525  AST 39 44*  ALT 29 31  ALKPHOS 138* 133*  BILITOT 0.8 1.3*  PROT 8.9* 8.8*  ALBUMIN 2.6* 2.5*   Recent Labs  Lab 07/28/19 1634  LIPASE 36   No results for input(s): AMMONIA in the last 168 hours. Coagulation profile No results for input(s): INR, PROTIME in the last 168 hours. COVID-19 Labs  No results for input(s): DDIMER, FERRITIN, LDH, CRP in the last 72 hours.  Lab Results  Component Value Date   SARSCOV2NAA NEGATIVE 08/01/2019   [place] NEGATIVE 07/28/2019   [place] NEGATIVE 10/19/2018    CBC: Recent Labs  Lab 07/28/19 1634 07/29/19 0525 07/30/19 1000  WBC 3.2* 8.0 4.3  NEUTROABS 1.9  --   --   HGB 7.6* 9.2* 8.9*  HCT 24.5* 30.2* 29.2*  MCV 84.5 85.1 84.9  PLT PLATELET CLUMPS NOTED ON SMEAR, UNABLE TO ESTIMATE 122* 133*   Cardiac Enzymes: No results for input(s): CKTOTAL, CKMB, CKMBINDEX, TROPONINI in the last 168 hours. BNP (last 3 results) No results for input(s): PROBNP in the last 8760 hours. CBG: Recent Labs  Lab 07/31/19 0615 07/31/19 1112 07/31/19 1639 07/31/19 2105 08/01/19 0606  GLUCAP 160* 211* 192* 252* 149*   D-Dimer: No results for input(s): DDIMER in the last 72 hours. Hgb A1c: No results for input(s): HGBA1C in the last 72 hours. Lipid Profile: No results for input(s): CHOL, HDL, LDLCALC, TRIG, CHOLHDL, LDLDIRECT in the last 72 hours. Thyroid function studies: No results for input(s): TSH, T4TOTAL, T3FREE, THYROIDAB in the last 72 hours.  Invalid input(s): FREET3 Anemia work up: No results for input(s):  VITAMINB12, FOLATE, FERRITIN, TIBC, IRON, RETICCTPCT in the last 72 hours. Sepsis Labs: Recent Labs  Lab 07/28/19 1634 07/29/19 0525 07/30/19 1000  WBC 3.2* 8.0 4.3   Microbiology Recent Results (from the past 240 hour(s))  Urine culture     Status: Abnormal   Collection Time: 07/28/19  4:34 PM   Specimen: Urine, Random  Result Value Ref Range Status   Specimen Description URINE, RANDOM  Final   Special Requests   Final    NONE Performed at [facility], [place] [address] [place], [place] [address]    Culture MULTIPLE SPECIES PRESENT, SUGGEST RECOLLECTION (A)  Final   Report Status 07/29/2019 FINAL  Final  Respiratory Panel by RT PCR (Flu A&B, Covid) - Nasopharyngeal Swab     Status: None   Collection Time: 07/28/19  8:20 PM   Specimen: Nasopharyngeal Swab  Result Value Ref Range Status   SARS Coronavirus 2 by RT PCR NEGATIVE NEGATIVE Final    Comment: (NOTE) SARS-CoV-2 target nucleic acids are NOT DETECTED. The SARS-CoV-2 RNA is generally detectable in upper respiratoy specimens during the acute phase of infection. The lowest concentration of SARS-CoV-2 viral copies this assay can detect is 131 copies/mL. A negative result does not preclude SARS-Cov-2 infection and should not be used as the sole basis for treatment or other patient management decisions. A negative result may occur with  improper specimen collection/handling, submission of specimen other than nasopharyngeal swab, presence of viral mutation(s) within the areas targeted by this assay, and inadequate number of viral copies (<131 copies/mL). A negative result must be combined with clinical observations, patient history, and epidemiological information. The expected result is Negative. Fact  Sheet for Patients:  PinkCheek.be Fact Sheet for Healthcare Providers:  GravelBags.it This test is not yet ap proved or cleared by the [place] FDA and  has  been authorized for detection and/or diagnosis of SARS-CoV-2 by FDA under an Emergency Use Authorization (EUA). This EUA will remain  in effect (meaning this test can be used) for the duration of the COVID-19 declaration under Section 564(b)(1) of the Act, 21 U.S.C. section 360bbb-3(b)(1), unless the authorization is terminated or revoked sooner.    Influenza A by PCR NEGATIVE NEGATIVE Final   Influenza B by PCR NEGATIVE NEGATIVE Final    Comment: (NOTE) The Xpert Xpress SARS-CoV-2/FLU/RSV assay is intended as an aid in  the diagnosis of influenza from Nasopharyngeal swab specimens and  should not be used as a sole basis for treatment. Nasal washings and  aspirates are unacceptable for Xpert Xpress SARS-CoV-2/FLU/RSV  testing. Fact Sheet for Patients: PinkCheek.be Fact Sheet for Healthcare Providers: GravelBags.it This test is not yet approved or cleared by the [place] FDA and  has been authorized for detection and/or diagnosis of SARS-CoV-2 by  FDA under an Emergency Use Authorization (EUA). This EUA will remain  in effect (meaning this test can be used) for the duration of the  Covid-19 declaration under Section 564(b)(1) of the Act, 21  U.S.C. section 360bbb-3(b)(1), unless the authorization is  terminated or revoked. Performed at [facility], [place] [address], [place], [place] [address]   SARS CORONAVIRUS 2 (TAT 6-24 HRS) Nasopharyngeal Nasopharyngeal Swab     Status: None   Collection Time: 08/01/19 12:28 AM   Specimen: Nasopharyngeal Swab  Result Value Ref Range Status   SARS Coronavirus 2 NEGATIVE NEGATIVE Final    Comment: (NOTE) SARS-CoV-2 target nucleic acids are NOT DETECTED. The SARS-CoV-2 RNA is generally detectable in upper and lower respiratory specimens during the acute phase of infection. Negative results do not preclude SARS-CoV-2 infection, do not rule out co-infections with other pathogens,  and should not be used as the sole basis for treatment or other patient management decisions. Negative results must be combined with clinical observations, patient history, and epidemiological information. The expected result is Negative. Fact Sheet for Patients: SugarRoll.be Fact Sheet for Healthcare Providers: https://www.woods-mathews.com/ This test is not yet approved or cleared by the [place] FDA and  has been authorized for detection and/or diagnosis of SARS-CoV-2 by FDA under an Emergency Use Authorization (EUA). This EUA will remain  in effect (meaning this test can be used) for the duration of the COVID-19 declaration under Section 56 4(b)(1) of the Act, 21 U.S.C. section 360bbb-3(b)(1), unless the authorization is terminated or revoked sooner. Performed at [facility], [place] [address], [place], [place] [address]      Medications:   . sodium chloride   Intravenous Once  . Chlorhexidine Gluconate Cloth  6 each Topical Daily  . citalopram  20 mg Oral Daily  . diltiazem  120 mg Oral BID  . ferrous sulfate  325 mg Oral Q breakfast  . gabapentin  100 mg Oral QHS  . glimepiride  1 mg Oral QAC breakfast  . pantoprazole  40 mg Oral BID AC  . pravastatin  40 mg Oral QPM  . tamsulosin  0.4 mg Oral Daily   Continuous Infusions:     LOS: 3 days   [facility]  08/01/2019, 9:57 AM

## 2019-08-01 NOTE — Plan of Care (Signed)
Patient alert and oriented, amendable to discharge today for rehab.

## 2019-08-01 NOTE — Progress Notes (Signed)
Physical Therapy Treatment Patient Details Name: [name] MRN: [id number] DOB: Jul 30, 1940 Today's Date: 08/01/2019    History of Present Illness 79 yo male admitted to ED on 5/3 with weakess, falls x4 in 48 hours; pt recovering from UTI. Pt with heme + stool, head/neck/hip imaging negative for acute findings s/p falls. PMH includes anemia, OA, balance impairment, CAP, CAD, cervical radiculopathy, COPD, DM, GERD, CVA, L eye legal blindness, afib, pacemaker.    PT Comments    Pt is hopeful for d/c to SNF today, but eager to ambulate with therapy before he goes. Pt request to use BSC before he goes. Pt continue to be limited in safe mobility by decreased strength and balance. Pt requires mod A for bed mobility, min A for transfers and min guard for ambulation of 100 feet with RW.  Pt will benefit from SNF to improve strength and balance for safe mobility when he goes home.    Follow Up Recommendations  SNF;Supervision/Assistance - 24 hour     Equipment Recommendations  None recommended by PT       Precautions / Restrictions Precautions Precautions: Fall;ICD/Pacemaker Restrictions Weight Bearing Restrictions: No    Mobility  Bed Mobility Overal bed mobility: Needs Assistance Bed Mobility: Supine to Sit     Supine to sit: Mod assist     General bed mobility comments: modA for managing LE to EoB, pt able to pull up against therapist but ultimately requires assist to bring trunk to upright, and for pad scoot of hips to EoB  Transfers Overall transfer level: Needs assistance Equipment used: Rolling walker (2 wheeled) Transfers: Sit to/from [facility] Sit to Stand: Min assist Stand pivot transfers: Min assist       General transfer comment: min A for power up from bed suraface and for support at hips for pivoting to [facility]  Ambulation/Gait Ambulation/Gait assistance: Min guard Gait Distance (Feet): 100 Feet Assistive device: Rolling walker (2  wheeled) Gait Pattern/deviations: Step-through pattern;Decreased stride length;Trunk flexed;Shuffle Gait velocity: decr Gait velocity interpretation: 1.31 - 2.62 ft/sec, indicative of limited community ambulator General Gait Details: hands on min guard for safety, multimodal cuing for proximity to RW        Balance Overall balance assessment: Needs assistance;History of Falls   Sitting balance-Leahy Scale: Good       Standing balance-Leahy Scale: Poor Standing balance comment: reliant on use of RW                            Cognition Arousal/Alertness: Awake/alert Behavior During Therapy: WFL for tasks assessed/performed Overall Cognitive Status: Within Functional Limits for tasks assessed Area of Impairment: Problem solving;Safety/judgement                         Safety/Judgement: Decreased awareness of safety   Problem Solving: Slow processing General Comments: cues for problem solving putting on gown and navigating around obstacles in hallway          General Comments General comments (skin integrity, edema, etc.): SaO2 on RA 94%O2 on RA after ambulation       Pertinent Vitals/Pain Pain Assessment: Faces Faces Pain Scale: Hurts little more Pain Location: jaw Pain Descriptors / Indicators: Aching;Sore Pain Intervention(s): Limited activity within patient's tolerance;Monitored during session;Repositioned;Premedicated before session           PT Goals (current goals can now be found in the care plan section) Acute Rehab PT Goals  Patient Stated Goal: get stronger at rehab PT Goal Formulation: With patient Time For Goal Achievement: 08/12/19 Potential to Achieve Goals: Good Progress towards PT goals: Progressing toward goals    Frequency    Min 2X/week      PT Plan Current plan remains appropriate       AM-PAC PT "6 Clicks" Mobility   Outcome Measure  Help needed turning from your back to your side while in a flat bed without  using bedrails?: A Little Help needed moving from lying on your back to sitting on the side of a flat bed without using bedrails?: A Little Help needed moving to and from a bed to a chair (including a wheelchair)?: A Little Help needed standing up from a chair using your arms (e.g., wheelchair or bedside chair)?: A Little Help needed to walk in hospital room?: A Little Help needed climbing 3-5 steps with a railing? : A Lot 6 Click Score: 17    End of Session Equipment Utilized During Treatment: Gait belt Activity Tolerance: Patient tolerated treatment well Patient left: in chair;with call bell/phone within reach;with chair alarm set Nurse Communication: Mobility status PT Visit Diagnosis: Other abnormalities of gait and mobility (R26.89);Muscle weakness (generalized) (M62.81);Repeated falls (R29.6)     Time: [id number] PT Time Calculation (min) (ACUTE ONLY): 26 min  Charges:  $Gait Training: 8-22 mins $Therapeutic Activity: 8-22 mins                     [name] B. Migdalia Dk PT, DPT Acute Rehabilitation Services Pager [phone] Office [phone]    [place] 08/01/2019, 1:30 PM

## 2019-08-01 NOTE — TOC Transition Note (Signed)
Transition of Care [facility]) - CM/SW Discharge Note   Patient Details  Name: [name] MRN: [id number] Date of Birth: 1941/01/13  [facility] CM/SW Contact:  Alberteen Sam, LCSW Phone Number: 08/01/2019, 9:55 AM   Clinical Narrative:     Patient will DC to: [facility] Anticipated DC date: 08/01/19 Family notified:n/a Transport [id number]  Per MD patient ready for DC to [facility] . RN, patient, patient's family, and facility notified of DC. Discharge Summary sent to facility. RN given number for report  [phone]. DC packet on chart. Ambulance transport requested for patient.  CSW signing off.  [place], [place]   Final next level of care: Skilled Nursing Facility Barriers to Discharge: No Barriers Identified   Patient Goals and CMS Choice Patient states their goals for this hospitalization and ongoing recovery are:: to go to rehab CMS Medicare.gov Compare Post Acute Care list provided to:: Patient Choice offered to / list presented to : Patient  Discharge Placement PASRR number recieved: 07/30/19            Patient chooses bed at: [facility] Patient to be transferred to facility by: [facility]   Patient and family notified of of transfer: 08/01/19  Discharge Plan and Services   Discharge Planning Services: CM Consult Post Acute Care Choice: [place]                               Social Determinants of Health (SDOH) Interventions     Readmission Risk Interventions No flowsheet data found.

## 2019-08-02 ENCOUNTER — Inpatient Hospital Stay (HOSPITAL_COMMUNITY)
Admission: EM | Admit: 2019-08-02 | Discharge: 2019-08-05 | DRG: 194 | Disposition: A | Discharge status: Skilled Nursing Facility | Payer: Medicare Other | Source: Skilled Nursing Facility | Attending: Internal Medicine | Admitting: Internal Medicine

## 2019-08-02 DIAGNOSIS — Z8673 Personal history of transient ischemic attack (TIA), and cerebral infarction without residual deficits: Secondary | ICD-10-CM

## 2019-08-02 DIAGNOSIS — Z20822 Contact with and (suspected) exposure to covid-19: Secondary | ICD-10-CM | POA: Diagnosis present

## 2019-08-02 DIAGNOSIS — Z888 Allergy status to other drugs, medicaments and biological substances status: Secondary | ICD-10-CM

## 2019-08-02 DIAGNOSIS — Z8249 Family history of ischemic heart disease and other diseases of the circulatory system: Secondary | ICD-10-CM

## 2019-08-02 DIAGNOSIS — Z803 Family history of malignant neoplasm of breast: Secondary | ICD-10-CM

## 2019-08-02 DIAGNOSIS — H548 Legal blindness, as defined in USA: Secondary | ICD-10-CM | POA: Diagnosis present

## 2019-08-02 DIAGNOSIS — E782 Mixed hyperlipidemia: Secondary | ICD-10-CM | POA: Diagnosis present

## 2019-08-02 DIAGNOSIS — K859 Acute pancreatitis without necrosis or infection, unspecified: Secondary | ICD-10-CM

## 2019-08-02 DIAGNOSIS — K5900 Constipation, unspecified: Secondary | ICD-10-CM | POA: Diagnosis present

## 2019-08-02 DIAGNOSIS — M159 Polyosteoarthritis, unspecified: Secondary | ICD-10-CM | POA: Diagnosis present

## 2019-08-02 DIAGNOSIS — R1012 Left upper quadrant pain: Secondary | ICD-10-CM | POA: Diagnosis not present

## 2019-08-02 DIAGNOSIS — K5730 Diverticulosis of large intestine without perforation or abscess without bleeding: Secondary | ICD-10-CM | POA: Diagnosis present

## 2019-08-02 DIAGNOSIS — E611 Iron deficiency: Secondary | ICD-10-CM | POA: Diagnosis present

## 2019-08-02 DIAGNOSIS — K219 Gastro-esophageal reflux disease without esophagitis: Secondary | ICD-10-CM | POA: Diagnosis present

## 2019-08-02 DIAGNOSIS — I4821 Permanent atrial fibrillation: Secondary | ICD-10-CM | POA: Diagnosis not present

## 2019-08-02 DIAGNOSIS — E119 Type 2 diabetes mellitus without complications: Secondary | ICD-10-CM

## 2019-08-02 DIAGNOSIS — I110 Hypertensive heart disease with heart failure: Secondary | ICD-10-CM | POA: Diagnosis present

## 2019-08-02 DIAGNOSIS — I5032 Chronic diastolic (congestive) heart failure: Secondary | ICD-10-CM | POA: Diagnosis present

## 2019-08-02 DIAGNOSIS — Z841 Family history of disorders of kidney and ureter: Secondary | ICD-10-CM

## 2019-08-02 DIAGNOSIS — R1032 Left lower quadrant pain: Secondary | ICD-10-CM | POA: Diagnosis not present

## 2019-08-02 DIAGNOSIS — R54 Age-related physical debility: Secondary | ICD-10-CM | POA: Diagnosis present

## 2019-08-02 DIAGNOSIS — Z87891 Personal history of nicotine dependence: Secondary | ICD-10-CM

## 2019-08-02 DIAGNOSIS — Z950 Presence of cardiac pacemaker: Secondary | ICD-10-CM

## 2019-08-02 DIAGNOSIS — Z833 Family history of diabetes mellitus: Secondary | ICD-10-CM

## 2019-08-02 DIAGNOSIS — J189 Pneumonia, unspecified organism: Secondary | ICD-10-CM | POA: Diagnosis not present

## 2019-08-02 DIAGNOSIS — J440 Chronic obstructive pulmonary disease with acute lower respiratory infection: Secondary | ICD-10-CM | POA: Diagnosis not present

## 2019-08-02 DIAGNOSIS — I4819 Other persistent atrial fibrillation: Secondary | ICD-10-CM | POA: Diagnosis present

## 2019-08-02 DIAGNOSIS — I2510 Atherosclerotic heart disease of native coronary artery without angina pectoris: Secondary | ICD-10-CM | POA: Diagnosis present

## 2019-08-02 DIAGNOSIS — F332 Major depressive disorder, recurrent severe without psychotic features: Secondary | ICD-10-CM | POA: Diagnosis not present

## 2019-08-02 DIAGNOSIS — I10 Essential (primary) hypertension: Secondary | ICD-10-CM | POA: Diagnosis present

## 2019-08-02 DIAGNOSIS — E1165 Type 2 diabetes mellitus with hyperglycemia: Secondary | ICD-10-CM | POA: Diagnosis present

## 2019-08-02 DIAGNOSIS — J449 Chronic obstructive pulmonary disease, unspecified: Secondary | ICD-10-CM | POA: Diagnosis present

## 2019-08-02 DIAGNOSIS — L2084 Intrinsic (allergic) eczema: Secondary | ICD-10-CM | POA: Diagnosis present

## 2019-08-02 DIAGNOSIS — Z806 Family history of leukemia: Secondary | ICD-10-CM

## 2019-08-02 DIAGNOSIS — Z7984 Long term (current) use of oral hypoglycemic drugs: Secondary | ICD-10-CM

## 2019-08-02 DIAGNOSIS — Z7982 Long term (current) use of aspirin: Secondary | ICD-10-CM

## 2019-08-02 NOTE — ED Triage Notes (Signed)
Pt c/o LLQ abd pain for the past several days was seen and dc yesterday for same states his pain is getting worse today, brought by [facility] from [facility].  BP 146/76, HR 100, r 20, cbg 216, SPO2 96% RA.

## 2019-08-03 ENCOUNTER — Emergency Department (HOSPITAL_COMMUNITY): Payer: Medicare Other

## 2019-08-03 ENCOUNTER — Other Ambulatory Visit: Payer: Self-pay

## 2019-08-03 ENCOUNTER — Encounter (HOSPITAL_COMMUNITY): Payer: Self-pay | Admitting: Emergency Medicine

## 2019-08-03 DIAGNOSIS — D500 Iron deficiency anemia secondary to blood loss (chronic): Secondary | ICD-10-CM | POA: Diagnosis not present

## 2019-08-03 DIAGNOSIS — K5900 Constipation, unspecified: Secondary | ICD-10-CM | POA: Diagnosis present

## 2019-08-03 DIAGNOSIS — I10 Essential (primary) hypertension: Secondary | ICD-10-CM | POA: Diagnosis not present

## 2019-08-03 DIAGNOSIS — I4821 Permanent atrial fibrillation: Secondary | ICD-10-CM | POA: Diagnosis present

## 2019-08-03 DIAGNOSIS — Z806 Family history of leukemia: Secondary | ICD-10-CM | POA: Diagnosis not present

## 2019-08-03 DIAGNOSIS — Z9181 History of falling: Secondary | ICD-10-CM | POA: Diagnosis not present

## 2019-08-03 DIAGNOSIS — F332 Major depressive disorder, recurrent severe without psychotic features: Secondary | ICD-10-CM | POA: Diagnosis present

## 2019-08-03 DIAGNOSIS — R2681 Unsteadiness on feet: Secondary | ICD-10-CM | POA: Diagnosis not present

## 2019-08-03 DIAGNOSIS — R1032 Left lower quadrant pain: Secondary | ICD-10-CM | POA: Diagnosis not present

## 2019-08-03 DIAGNOSIS — E782 Mixed hyperlipidemia: Secondary | ICD-10-CM | POA: Diagnosis present

## 2019-08-03 DIAGNOSIS — I25118 Atherosclerotic heart disease of native coronary artery with other forms of angina pectoris: Secondary | ICD-10-CM | POA: Diagnosis not present

## 2019-08-03 DIAGNOSIS — K5730 Diverticulosis of large intestine without perforation or abscess without bleeding: Secondary | ICD-10-CM | POA: Diagnosis present

## 2019-08-03 DIAGNOSIS — E611 Iron deficiency: Secondary | ICD-10-CM | POA: Diagnosis present

## 2019-08-03 DIAGNOSIS — Z841 Family history of disorders of kidney and ureter: Secondary | ICD-10-CM | POA: Diagnosis not present

## 2019-08-03 DIAGNOSIS — Z950 Presence of cardiac pacemaker: Secondary | ICD-10-CM | POA: Diagnosis not present

## 2019-08-03 DIAGNOSIS — J449 Chronic obstructive pulmonary disease, unspecified: Secondary | ICD-10-CM | POA: Diagnosis not present

## 2019-08-03 DIAGNOSIS — J440 Chronic obstructive pulmonary disease with acute lower respiratory infection: Secondary | ICD-10-CM | POA: Diagnosis present

## 2019-08-03 DIAGNOSIS — Z7401 Bed confinement status: Secondary | ICD-10-CM | POA: Diagnosis not present

## 2019-08-03 DIAGNOSIS — I5032 Chronic diastolic (congestive) heart failure: Secondary | ICD-10-CM | POA: Diagnosis present

## 2019-08-03 DIAGNOSIS — E1165 Type 2 diabetes mellitus with hyperglycemia: Secondary | ICD-10-CM | POA: Diagnosis present

## 2019-08-03 DIAGNOSIS — J069 Acute upper respiratory infection, unspecified: Secondary | ICD-10-CM | POA: Diagnosis not present

## 2019-08-03 DIAGNOSIS — I4892 Unspecified atrial flutter: Secondary | ICD-10-CM | POA: Diagnosis not present

## 2019-08-03 DIAGNOSIS — I110 Hypertensive heart disease with heart failure: Secondary | ICD-10-CM | POA: Diagnosis present

## 2019-08-03 DIAGNOSIS — J189 Pneumonia, unspecified organism: Secondary | ICD-10-CM | POA: Diagnosis present

## 2019-08-03 DIAGNOSIS — R1012 Left upper quadrant pain: Secondary | ICD-10-CM | POA: Diagnosis not present

## 2019-08-03 DIAGNOSIS — M6281 Muscle weakness (generalized): Secondary | ICD-10-CM | POA: Diagnosis not present

## 2019-08-03 DIAGNOSIS — Z833 Family history of diabetes mellitus: Secondary | ICD-10-CM | POA: Diagnosis not present

## 2019-08-03 DIAGNOSIS — I69328 Other speech and language deficits following cerebral infarction: Secondary | ICD-10-CM | POA: Diagnosis not present

## 2019-08-03 DIAGNOSIS — R4182 Altered mental status, unspecified: Secondary | ICD-10-CM | POA: Diagnosis not present

## 2019-08-03 DIAGNOSIS — M159 Polyosteoarthritis, unspecified: Secondary | ICD-10-CM | POA: Diagnosis present

## 2019-08-03 DIAGNOSIS — I4819 Other persistent atrial fibrillation: Secondary | ICD-10-CM | POA: Diagnosis not present

## 2019-08-03 DIAGNOSIS — I2510 Atherosclerotic heart disease of native coronary artery without angina pectoris: Secondary | ICD-10-CM | POA: Diagnosis present

## 2019-08-03 DIAGNOSIS — N390 Urinary tract infection, site not specified: Secondary | ICD-10-CM | POA: Diagnosis not present

## 2019-08-03 DIAGNOSIS — R1312 Dysphagia, oropharyngeal phase: Secondary | ICD-10-CM | POA: Diagnosis not present

## 2019-08-03 DIAGNOSIS — Z803 Family history of malignant neoplasm of breast: Secondary | ICD-10-CM | POA: Diagnosis not present

## 2019-08-03 DIAGNOSIS — I69398 Other sequelae of cerebral infarction: Secondary | ICD-10-CM | POA: Diagnosis not present

## 2019-08-03 DIAGNOSIS — M2550 Pain in unspecified joint: Secondary | ICD-10-CM | POA: Diagnosis not present

## 2019-08-03 DIAGNOSIS — K219 Gastro-esophageal reflux disease without esophagitis: Secondary | ICD-10-CM | POA: Diagnosis present

## 2019-08-03 DIAGNOSIS — H548 Legal blindness, as defined in USA: Secondary | ICD-10-CM | POA: Diagnosis present

## 2019-08-03 DIAGNOSIS — D509 Iron deficiency anemia, unspecified: Secondary | ICD-10-CM | POA: Diagnosis not present

## 2019-08-03 DIAGNOSIS — Z8673 Personal history of transient ischemic attack (TIA), and cerebral infarction without residual deficits: Secondary | ICD-10-CM | POA: Diagnosis not present

## 2019-08-03 DIAGNOSIS — R41841 Cognitive communication deficit: Secondary | ICD-10-CM | POA: Diagnosis not present

## 2019-08-03 DIAGNOSIS — I69828 Other speech and language deficits following other cerebrovascular disease: Secondary | ICD-10-CM | POA: Diagnosis not present

## 2019-08-03 DIAGNOSIS — L2084 Intrinsic (allergic) eczema: Secondary | ICD-10-CM | POA: Diagnosis present

## 2019-08-03 DIAGNOSIS — Z20822 Contact with and (suspected) exposure to covid-19: Secondary | ICD-10-CM | POA: Diagnosis present

## 2019-08-03 DIAGNOSIS — R52 Pain, unspecified: Secondary | ICD-10-CM | POA: Diagnosis not present

## 2019-08-03 DIAGNOSIS — I4891 Unspecified atrial fibrillation: Secondary | ICD-10-CM | POA: Diagnosis not present

## 2019-08-03 DIAGNOSIS — R1084 Generalized abdominal pain: Secondary | ICD-10-CM | POA: Diagnosis not present

## 2019-08-03 DIAGNOSIS — B9689 Other specified bacterial agents as the cause of diseases classified elsewhere: Secondary | ICD-10-CM | POA: Diagnosis not present

## 2019-08-03 DIAGNOSIS — Z8249 Family history of ischemic heart disease and other diseases of the circulatory system: Secondary | ICD-10-CM | POA: Diagnosis not present

## 2019-08-03 LAB — HEPATIC FUNCTION PANEL
ALT: 34 U/L (ref 0–44)
AST: 48 U/L — ABNORMAL HIGH (ref 15–41)
Albumin: 2.6 g/dL — ABNORMAL LOW (ref 3.5–5.0)
Alkaline Phosphatase: 141 U/L — ABNORMAL HIGH (ref 38–126)
Bilirubin, Direct: 0.2 mg/dL (ref 0.0–0.2)
Indirect Bilirubin: 0.6 mg/dL (ref 0.3–0.9)
Total Bilirubin: 0.8 mg/dL (ref 0.3–1.2)
Total Protein: 9.4 g/dL — ABNORMAL HIGH (ref 6.5–8.1)

## 2019-08-03 LAB — BLOOD CULTURE ID PANEL (REFLEXED)

## 2019-08-03 LAB — URINALYSIS, ROUTINE W REFLEX MICROSCOPIC
Bilirubin Urine: NEGATIVE
Glucose, UA: NEGATIVE mg/dL
Ketones, ur: NEGATIVE mg/dL
Nitrite: POSITIVE — AB
Protein, ur: 100 mg/dL — AB
RBC / HPF: >50 RBC/hpf — ABNORMAL HIGH (ref 0–5)
Specific Gravity, Urine: >1.046 — ABNORMAL HIGH (ref 1.005–1.030)
WBC, UA: >50 WBC/hpf — ABNORMAL HIGH (ref 0–5)
pH: 5 (ref 5.0–8.0)

## 2019-08-03 LAB — GLUCOSE, CAPILLARY
Glucose-Capillary: 178 mg/dL — ABNORMAL HIGH (ref 70–99)
Glucose-Capillary: 113 mg/dL — ABNORMAL HIGH (ref 70–99)

## 2019-08-03 LAB — CBC WITH DIFFERENTIAL/PLATELET
Abs Immature Granulocytes: 0.03 10*3/uL (ref 0.00–0.07)
Basophils Absolute: 0 10*3/uL (ref 0.0–0.1)
Basophils Relative: 1 %
Eosinophils Absolute: 0.1 10*3/uL (ref 0.0–0.5)
Eosinophils Relative: 1 %
HCT: 33.5 % — ABNORMAL LOW (ref 39.0–52.0)
Hemoglobin: 9.9 g/dL — ABNORMAL LOW (ref 13.0–17.0)
Immature Granulocytes: 0 %
Lymphocytes Relative: 6 %
Lymphs Abs: 0.5 10*3/uL — ABNORMAL LOW (ref 0.7–4.0)
MCH: 25.5 pg — ABNORMAL LOW (ref 26.0–34.0)
MCHC: 29.6 g/dL — ABNORMAL LOW (ref 30.0–36.0)
MCV: 86.3 fL (ref 80.0–100.0)
Monocytes Absolute: 1.2 10*3/uL — ABNORMAL HIGH (ref 0.1–1.0)
Monocytes Relative: 17 %
Neutro Abs: 5.3 10*3/uL (ref 1.7–7.7)
Neutrophils Relative %: 75 %
Platelets: 142 10*3/uL — ABNORMAL LOW (ref 150–400)
RBC: 3.88 MIL/uL — ABNORMAL LOW (ref 4.22–5.81)
RDW: 18.3 % — ABNORMAL HIGH (ref 11.5–15.5)
WBC: 7.1 10*3/uL (ref 4.0–10.5)
nRBC: 0 % (ref 0.0–0.2)

## 2019-08-03 LAB — ETHANOL: Alcohol, Ethyl (B): <10 mg/dL (ref <10)

## 2019-08-03 LAB — LIPASE, BLOOD: Lipase: 65 U/L — ABNORMAL HIGH (ref 11–51)

## 2019-08-03 LAB — CBG MONITORING, ED
Glucose-Capillary: 148 mg/dL — ABNORMAL HIGH (ref 70–99)
Glucose-Capillary: 180 mg/dL — ABNORMAL HIGH (ref 70–99)

## 2019-08-03 LAB — I-STAT CHEM 8, ED
BUN: 15 mg/dL (ref 8–23)
Calcium, Ion: 1.2 mmol/L (ref 1.15–1.40)
Chloride: 100 mmol/L (ref 98–111)
Creatinine, Ser: 1.1 mg/dL (ref 0.61–1.24)
Glucose, Bld: 215 mg/dL — ABNORMAL HIGH (ref 70–99)
HCT: 33 % — ABNORMAL LOW (ref 39.0–52.0)
Hemoglobin: 11.2 g/dL — ABNORMAL LOW (ref 13.0–17.0)
Potassium: 3.6 mmol/L (ref 3.5–5.1)
Sodium: 134 mmol/L — ABNORMAL LOW (ref 135–145)
TCO2: 22 mmol/L (ref 22–32)

## 2019-08-03 LAB — STREP PNEUMONIAE URINARY ANTIGEN: Strep Pneumo Urinary Antigen: NEGATIVE

## 2019-08-03 LAB — RESPIRATORY PANEL BY RT PCR (FLU A&B, COVID)
Influenza A by PCR: NEGATIVE
Influenza B by PCR: NEGATIVE
SARS Coronavirus 2 by RT PCR: NEGATIVE

## 2019-08-03 LAB — HIV ANTIBODY (ROUTINE TESTING W REFLEX): HIV Screen 4th Generation wRfx: NONREACTIVE

## 2019-08-03 LAB — MRSA PCR SCREENING: MRSA by PCR: NEGATIVE

## 2019-08-03 MED ORDER — FERROUS SULFATE 325 (65 FE) MG PO TABS
325.0000 mg | ORAL_TABLET | Freq: Every day | ORAL | Status: DC
  Administered 2019-08-03 – 2019-08-05 (×3): 325 mg via ORAL
  Filled 2019-08-03 (×3): qty 1

## 2019-08-03 MED ORDER — GABAPENTIN 100 MG PO CAPS
100.0000 mg | ORAL_CAPSULE | Freq: Every day | ORAL | Status: DC
  Administered 2019-08-03 – 2019-08-04 (×2): 100 mg via ORAL
  Filled 2019-08-03 (×2): qty 1

## 2019-08-03 MED ORDER — SENNOSIDES-DOCUSATE SODIUM 8.6-50 MG PO TABS
1.0000 | ORAL_TABLET | Freq: Two times a day (BID) | ORAL | Status: DC
  Administered 2019-08-03 – 2019-08-04 (×2): 1 via ORAL
  Filled 2019-08-03 (×3): qty 1

## 2019-08-03 MED ORDER — KETOROLAC TROMETHAMINE 30 MG/ML IJ SOLN: 30.0000 mg | Freq: Once | INTRAMUSCULAR | Status: DC

## 2019-08-03 MED ORDER — PANTOPRAZOLE SODIUM 40 MG IV SOLR
40.0000 mg | INTRAVENOUS | Status: DC
  Administered 2019-08-03 – 2019-08-05 (×3): 40 mg via INTRAVENOUS
  Filled 2019-08-03 (×3): qty 40

## 2019-08-03 MED ORDER — IOHEXOL 300 MG/ML  SOLN
100.0000 mL | Freq: Once | INTRAMUSCULAR | Status: AC | PRN
  Administered 2019-08-03: 100 mL via INTRAVENOUS

## 2019-08-03 MED ORDER — INSULIN ASPART 100 UNIT/ML ~~LOC~~ SOLN
0.0000 [IU] | Freq: Three times a day (TID) | SUBCUTANEOUS | Status: DC
  Administered 2019-08-03: 2 [IU] via SUBCUTANEOUS
  Administered 2019-08-04 (×3): 3 [IU] via SUBCUTANEOUS
  Administered 2019-08-05: 2 [IU] via SUBCUTANEOUS
  Administered 2019-08-05: 3 [IU] via SUBCUTANEOUS

## 2019-08-03 MED ORDER — POLYETHYLENE GLYCOL 3350 17 G PO PACK
17.0000 g | PACK | Freq: Three times a day (TID) | ORAL | Status: DC
  Administered 2019-08-03 – 2019-08-04 (×4): 17 g via ORAL
  Filled 2019-08-03 (×4): qty 1

## 2019-08-03 MED ORDER — TAMSULOSIN HCL 0.4 MG PO CAPS
0.4000 mg | ORAL_CAPSULE | Freq: Every day | ORAL | Status: DC
  Administered 2019-08-03 – 2019-08-05 (×3): 0.4 mg via ORAL
  Filled 2019-08-03 (×3): qty 1

## 2019-08-03 MED ORDER — FENTANYL CITRATE (PF) 100 MCG/2ML IJ SOLN
25.0000 ug | Freq: Once | INTRAMUSCULAR | Status: AC
  Administered 2019-08-03: 25 ug via INTRAVENOUS
  Filled 2019-08-03: qty 2

## 2019-08-03 MED ORDER — NITROGLYCERIN 0.4 MG SL SUBL: 0.4000 mg | SUBLINGUAL_TABLET | SUBLINGUAL | Status: DC | PRN

## 2019-08-03 MED ORDER — VANCOMYCIN HCL 1500 MG/300ML IV SOLN
1500.0000 mg | Freq: Once | INTRAVENOUS | Status: AC
  Administered 2019-08-03: 1500 mg via INTRAVENOUS
  Filled 2019-08-03: qty 300

## 2019-08-03 MED ORDER — ACETAMINOPHEN 325 MG PO TABS
650.0000 mg | ORAL_TABLET | Freq: Four times a day (QID) | ORAL | Status: DC | PRN
  Administered 2019-08-04 (×2): 650 mg via ORAL
  Filled 2019-08-03 (×2): qty 2

## 2019-08-03 MED ORDER — ENOXAPARIN SODIUM 40 MG/0.4ML ~~LOC~~ SOLN
40.0000 mg | Freq: Every day | SUBCUTANEOUS | Status: DC
  Administered 2019-08-03 – 2019-08-05 (×3): 40 mg via SUBCUTANEOUS
  Filled 2019-08-03 (×3): qty 0.4

## 2019-08-03 MED ORDER — SODIUM CHLORIDE 0.9 % IV SOLN
2.0000 g | Freq: Two times a day (BID) | INTRAVENOUS | Status: DC
  Administered 2019-08-03 – 2019-08-05 (×4): 2 g via INTRAVENOUS
  Filled 2019-08-03 (×4): qty 2

## 2019-08-03 MED ORDER — SODIUM CHLORIDE 0.9 % IV SOLN
2.0000 g | Freq: Once | INTRAVENOUS | Status: AC
  Administered 2019-08-03: 2 g via INTRAVENOUS
  Filled 2019-08-03: qty 2

## 2019-08-03 MED ORDER — PRAVASTATIN SODIUM 40 MG PO TABS
40.0000 mg | ORAL_TABLET | Freq: Every evening | ORAL | Status: DC
  Administered 2019-08-03 – 2019-08-04 (×2): 40 mg via ORAL
  Filled 2019-08-03 (×2): qty 1

## 2019-08-03 MED ORDER — LACTATED RINGERS IV SOLN: INTRAVENOUS | Status: AC

## 2019-08-03 MED ORDER — DILTIAZEM HCL ER COATED BEADS 120 MG PO CP24
120.0000 mg | ORAL_CAPSULE | Freq: Two times a day (BID) | ORAL | Status: DC
  Administered 2019-08-03 – 2019-08-05 (×5): 120 mg via ORAL
  Filled 2019-08-03 (×7): qty 1

## 2019-08-03 MED ORDER — METOCLOPRAMIDE HCL 5 MG/ML IJ SOLN: 5.0000 mg | Freq: Four times a day (QID) | INTRAMUSCULAR | Status: DC | PRN

## 2019-08-03 MED ORDER — VANCOMYCIN HCL IN DEXTROSE 1-5 GM/200ML-% IV SOLN
1000.0000 mg | INTRAVENOUS | Status: DC
  Administered 2019-08-04: 1000 mg via INTRAVENOUS
  Filled 2019-08-03: qty 200

## 2019-08-03 MED ORDER — ASPIRIN EC 81 MG PO TBEC
81.0000 mg | DELAYED_RELEASE_TABLET | Freq: Every day | ORAL | Status: DC
  Administered 2019-08-03 – 2019-08-05 (×3): 81 mg via ORAL
  Filled 2019-08-03 (×3): qty 1

## 2019-08-03 MED ORDER — CHLORHEXIDINE GLUCONATE CLOTH 2 % EX PADS
6.0000 | MEDICATED_PAD | Freq: Every day | CUTANEOUS | Status: DC
  Administered 2019-08-03 – 2019-08-04 (×2): 6 via TOPICAL

## 2019-08-03 MED ORDER — CITALOPRAM HYDROBROMIDE 20 MG PO TABS
20.0000 mg | ORAL_TABLET | Freq: Every day | ORAL | Status: DC
  Administered 2019-08-03 – 2019-08-05 (×3): 20 mg via ORAL
  Filled 2019-08-03 (×2): qty 1
  Filled 2019-08-03: qty 2

## 2019-08-03 NOTE — H&P (Addendum)
[facility] History and Physical  Cassie Fincke [id number] DOB: 07-27-1940 DOA: 08/02/2019  Referring EDP: Randal Buba PCP: Nicolette Bang, DO   Chief Complaint: Abdominal pain  HPI: Rudolphe Testerman is a 79 y.o. male with PMH of chronic normocytic anemia atrial flutter/atrial fibrillation (not on anticoagulation due to history of GI bleed), chronic diastolic heart failure, COPD, T2DM, nonhemorrhagic CVA who presented to ED with abdominal pain and found to have HCAP.  Patient is a poor historian but reports epigastric and LUQ/LLQ pain for several weeks. Reports pain is sharp but does not radiate. Reports constant pain and worsening. Nothing has helped pain, worse by eating certain foods and reports he is eating "light" foods only. Reports that he is very hungry and thirsty currently. He believes he is having an episode of pancreatitis which he has had before. Reports cough and SOB for several weeks as well. Denies headache, dizziness, fever, chills, chest pain, nausea, vomiting, diarrhea, constipation, dysuria, hematuria, hematochezia, melena, difficulty moving arms/legs, speech difficulty, confusion or any other complaints.  In the ED: Vitals stable on room air. Labs remarkable for WBC 7.1, Hgb 9.9, AST 48, Lipase 65, glucose 215. UA also appears infected but patient has chronic catheter in place.  CT Abd/Pelv: 1. LEFT lower lobe pneumonia. 2. No acute findings in the abdomen pelvis. 3. Foley catheter in bladder.  Patient was given Fentanyl. Toradol, Cefepime and Vanc ordered. Called for admission for HCAP.   Review of Systems:  All other systems negative unless noted above in HPI.   Past Medical History:  Diagnosis Date  . Allergic rhinitis   . Anemia    takes Ferrous Sulfate daily  . Arthritis    "all over"  . Atrial flutter ([place])   . Balance problem 01/2014  . CAP (community acquired pneumonia) 09/18/2014  . Cervical radiculopathy due to degenerative joint  disease of spine   . Chronic coronary artery disease    a. 03/2010 Nonocclusive disease by cath, performed for ST elevations on ECG;  b. 06/2013 Lexi MV: EF 60%, no ischemia.  [name] COPD (chronic obstructive pulmonary disease) ([place])   . Diabetes mellitus type II    takes Metformin and Lantus daily  . Diastolic CHF, chronic ([place])    a. 12/2012 EF 55-60%, diast dysfxn, triv MR, mildly dil LA/RA.  [name] Dyspnea    with pain  . Essential hypertension    takes Diltiazem daily  . Frail elderly   . GERD (gastroesophageal reflux disease)   . History of blood transfusion 1982   "when I had stomach OR"  . History of bronchitis    1998  . History of CVA (cerebrovascular accident)   . History of gastric ulcer   . History of kidney stones   . Hypercholesterolemia    takes Pravastatin daily  . Intrinsic eczema   . Legally blind in left eye, as defined in [place]   . Obesity   . PAF (paroxysmal atrial fibrillation) (H[facility])    Recurrent after atrial flutter (a. 07/2010 Status post caval tricuspid isthmus ablation by Dr. Midge Aver Metoprolol daily), currently controlled on flecainide plus diltiazem and Coumadin  . Urinary retention   . Vitamin D deficiency   . Weakness    numbness and tingling both hands   Past Surgical History:  Procedure Laterality Date  . ATRIAL ABLATION SURGERY  08/05/10   CTI ablation for atrial flutter by JA  . CARDIAC CATHETERIZATION  2012   nl LV function, no occlusive CAD, PAF  .  CARDIOVERSION  12/07/2010    Successful direct current cardioversion with atrial fibrillation to normal sinus rhythm  . CARPAL TUNNEL RELEASE Bilateral 01/30/2014   Procedure: BILATERAL CARPAL TUNNEL RELEASE;  Surgeon: Marianna Payment, MD;  Location: [place];  Service: Orthopedics;  Laterality: Bilateral;  . CATARACT EXTRACTION W/ INTRAOCULAR LENS  IMPLANT, BILATERAL Bilateral   . COLONOSCOPY [name] 12/02/2013   Procedure: COLONOSCOPY;  Surgeon: Irene Shipper, MD;  Location: [place];  Service:  Endoscopy;  Laterality: [name];  . ESOPHAGOGASTRODUODENOSCOPY [name] 09/22/2014   Procedure: ESOPHAGOGASTRODUODENOSCOPY (EGD);  Surgeon: Ronald Lobo, MD;  Location: [facility] ENDOSCOPY;  Service: Endoscopy;  Laterality: [name];  . ESOPHAGOGASTRODUODENOSCOPY [name] 07/11/2016   Procedure: ESOPHAGOGASTRODUODENOSCOPY (EGD);  Surgeon: Doran Stabler, MD;  Location[facility] ENDOSCOPY;  Service: Endoscopy;  Laterality: [name];  . EXTRACORPOREAL SHOCK WAVE LITHOTRIPSY Right 07/15/2018   Procedure: EXTRACORPOREAL SHOCK WAVE LITHOTRIPSY (ESWL);  Surgeon: Lucas Mallow, MD;  Location: [facility];  Service: Urology;  Laterality: Right;  . INCISION AND DRAINAGE ABSCESS / HEMATOMA OF BURSA / KNEE / THIGH Left 1998   knee  . KNEE BURSECTOMY Left 1998  . LAPAROSCOPIC CHOLECYSTECTOMY  03/2010  . LEFT HEART CATH AND CORONARY ANGIOGRAPHY [name] 10/05/2017   Procedure: LEFT HEART CATH AND CORONARY ANGIOGRAPHY;  Surgeon: Martinique, Peter M, MD;  Location: [facility];  Service: Cardiovascular;  Laterality: [name];  . NM MYOVIEW LTD  07/22/2013   Normal EF ~60%, no ischemia or infarction.  [name] PACEMAKER IMPLANT [name] 10/21/2018   Procedure: PACEMAKER IMPLANT;  Surgeon: Deboraha Sprang, MD;  Location: [place] [facility];  Service: Cardiovascular;  Laterality: [name];  . PARTIAL GASTRECTOMY  1982   subtotal; "took out 30% for ulcers"  . TRANSTHORACIC ECHOCARDIOGRAM  02/16/2014   EF 60%, no RWMA. - otherwise normal  . YAG LASER APPLICATION Bilateral    Social History:  reports that he has quit smoking. His smoking use included cigarettes. He has a 14.25 pack-year smoking history. He has never used smokeless tobacco. He reports that he does not drink alcohol or use drugs.  Allergies  Allergen Reactions  . Cyclobenzaprine Other (See Comments)    Unsteady gait  . Other Other (See Comments)    Steroids speeds his heart rate    Family History  Problem Relation Age of Onset  . Breast cancer Mother   . Diabetes Mother   . Kidney disease Mother     . Heart attack Father   . Heart disease Father   . Heart attack Brother   . Leukemia Daughter     Prior to Admission medications   Medication Sig Start Date End Date Taking? Authorizing Provider  aspirin EC 81 MG tablet Take 81 mg by mouth daily.   Yes [provider]  citalopram (CELEXA) 20 MG tablet Take 1 tablet (20 mg total) by mouth daily. 10/02/18  Yes Scot Jun, FNP  diltiazem (CARDIZEM CD) 120 MG 24 hr capsule Take 1 capsule (120 mg total) by mouth 2 (two) times daily. 04/07/19  Yes Camnitz, Will Hassell Done, MD  ferrous sulfate 325 (65 FE) MG tablet Take 325 mg by mouth daily with breakfast.   Yes [provider]  gabapentin (NEURONTIN) 100 MG capsule Take 1 capsule (100 mg total) by mouth at bedtime. 04/22/19  Yes Ladell Pier, MD  glimepiride (AMARYL) 2 MG tablet Take 0.5 tablets (1 mg total) by mouth daily before breakfast. 04/22/19  Yes Ladell Pier, MD  nitroGLYCERIN (NITROSTAT)  0.4 MG SL tablet Place 1 tablet (0.4 mg total) under the tongue every 5 (five) minutes x 3 doses as needed for chest pain. 11/19/15  Yes Kilroy, Luke K, PA-C  pantoprazole (PROTONIX) 40 MG tablet Take 1 tablet (40 mg total) by mouth 2 (two) times daily before a meal. Dx: K21.9 07/01/19  Yes Nicolette Bang, DO  pravastatin (PRAVACHOL) 40 MG tablet Take 1 tablet (40 mg total) by mouth every evening. 07/01/19  Yes Nicolette Bang, DO  tamsulosin (FLOMAX) 0.4 MG CAPS capsule Take 1 capsule (0.4 mg total) by mouth daily. 10/02/18  Yes Scot Jun, FNP  Accu-Chek FastClix Lancets MISC USE TO TEST BLOOD SUGAR TWICE DAILY. E11.00 12/04/18   Charlott Rakes, MD  glucose blood (ACCU-CHEK GUIDE) test strip USE TO TEST BLOOD SUGAR TWICE DAILY. E11.00 12/19/18   Charlott Rakes, MD   Physical Exam: Vitals:   08/03/19 0008 08/03/19 0015 08/03/19 0030 08/03/19 0315  BP: (!) 128/58 132/60 (!) 122/49 113/65  Pulse: (!) 105 96 95 97  Resp: 18 19 17 19   Temp: 98.5 F  (36.9 C)     TempSrc: Oral     SpO2: 96% 96% 95% 91%  Weight:      Height:        Wt Readings from Last 3 Encounters:  08/02/19 75.3 kg  08/01/19 75.3 kg  07/04/19 79.4 kg    . General:  Appears calm and comfortable. AAOx4.  . Eyes: Legally blind, eyes closed though most of exam. . ENT: grossly normal hearing, lips chapped and dry oral mucosa . Neck: normal ROM . Cardiovascular: Irregularly irregular rhythm, no m/r/g. No LE edema. [name] Respiratory: CTA bilaterally, no w/r/r. Normal respiratory effort. . Abdomen: soft, generalized TTP most localized in epigastric region . Skin: no rash or induration seen on limited exam . Musculoskeletal: grossly normal tone BUE/BLE . Psychiatric: grossly normal mood and affect, speech fluent and appropriate, repeatedly asking for something to eat and drink . Neurologic: grossly non-focal.          Labs on Admission:  Basic Metabolic Panel: Recent Labs  Lab 07/28/19 1634 07/28/19 1634 07/29/19 0525 07/30/19 1143 07/31/19 0453 08/01/19 0737 08/03/19 0113  NA 137   < > 138 134* 136 135 134*  K 3.8   < > 3.3* 4.2 4.2 3.9 3.6  CL 106   < > 105 104 107 105 100  CO2 21*  --  20* 19* 20* 21*  --   GLUCOSE 192*   < > 158* 196* 147* 138* 215*  BUN 21   < > 18 20 19 13 15   CREATININE 1.38*   < > 1.33* 1.47* 1.53* 1.31* 1.10  CALCIUM 9.2  --  9.3 8.6* 8.9 8.7*  --   MG  --   --  1.7  --   --   --   --   PHOS  --   --  3.0  --   --   --   --    < > = values in this interval not displayed.   Liver Function Tests: Recent Labs  Lab 07/28/19 1634 07/29/19 0525 08/03/19 0110  AST 39 44* 48*  ALT 29 31 34  ALKPHOS 138* 133* 141*  BILITOT 0.8 1.3* 0.8  PROT 8.9* 8.8* 9.4*  ALBUMIN 2.6* 2.5* 2.6*   Recent Labs  Lab 07/28/19 1634 08/03/19 0111  LIPASE 36 65*   No results for input(s): AMMONIA in the last 168 hours.  CBC: Recent Labs  Lab 07/28/19 1634 07/29/19 0525 07/30/19 1000 08/03/19 0110 08/03/19 0113  WBC 3.2* 8.0 4.3 7.1   --   NEUTROABS 1.9  --   --  5.3  --   HGB 7.6* 9.2* 8.9* 9.9* 11.2*  HCT 24.5* 30.2* 29.2* 33.5* 33.0*  MCV 84.5 85.1 84.9 86.3  --   PLT PLATELET CLUMPS NOTED ON SMEAR, UNABLE TO ESTIMATE 122* 133* 142*  --    Cardiac Enzymes: No results for input(s): CKTOTAL, CKMB, CKMBINDEX, TROPONINI in the last 168 hours.  BNP (last 3 results) No results for input(s): BNP in the last 8760 hours.  ProBNP (last 3 results) No results for input(s): PROBNP in the last 8760 hours.  CBG: Recent Labs  Lab 07/31/19 1112 07/31/19 1639 07/31/19 2105 08/01/19 0606 08/01/19 1231  GLUCAP 211* 192* 252* 149* 287*    Radiological Exams on Admission: CT ABDOMEN PELVIS W CONTRAST  Result Date: 08/03/2019 CLINICAL DATA:  LEFT lower quadrant pain. EXAM: CT ABDOMEN AND PELVIS WITH CONTRAST TECHNIQUE: Multidetector CT imaging of the abdomen and pelvis was performed using the standard protocol following bolus administration of intravenous contrast. CONTRAST:  118mL OMNIPAQUE IOHEXOL 300 MG/ML  SOLN COMPARISON:  CT 07/28/2019 FINDINGS: Lower chest: New airspace consolidation in the LEFT lower lobe with some air bronchograms most consistent with pneumonia. Hepatobiliary: No focal hepatic lesion. Postcholecystectomy. No biliary dilatation. Pancreas: Pancreas is normal. No ductal dilatation. No pancreatic inflammation. Spleen: Normal spleen Adrenals/urinary tract: Adrenal glands and kidneys are normal. The ureters and bladder normal. Foley catheter within the bladder. Stomach/Bowel: Partial gastrectomy anatomy. Large volume of food stuff in the stomach. Small bowel normal. Appendix and cecum normal. Colon rectosigmoid colon normal. Vascular/Lymphatic: Abdominal aorta is normal caliber with atherosclerotic calcification. There is no retroperitoneal or periportal lymphadenopathy. No pelvic lymphadenopathy. Reproductive: Prostate small. Other: No free fluid. Musculoskeletal: No aggressive osseous lesion. IMPRESSION: 1. LEFT  lower lobe pneumonia. 2. No acute findings in the abdomen pelvis. 3. Foley catheter in bladder. Electronically Signed   By: Suzy Bouchard M.D.   On: 08/03/2019 04:15    EKG: None  Assessment/Plan Principal Problem:   HCAP (healthcare-associated pneumonia) Active Problems:   Essential hypertension   Chronic diastolic congestive heart failure (HCC)   Persistent atrial fibrillation (HCC)   Mixed hyperlipidemia   Type 2 diabetes mellitus (HCC)   COPD (chronic obstructive pulmonary disease) (HCC)   Major depressive disorder, recurrent severe without psychotic features ([place])  79 y.o. male with PMH of chronic normocytic anemia atrial flutter/atrial fibrillation (not on anticoagulation due to history of GI bleed), chronic diastolic heart failure, COPD, T2DM, nonhemorrhagic CVA who presented to ED with abdominal pain and found to have HCAP.  HCAP Recent discharge 5/6 after admission for weakness and symptomatic iron deficiency PNA noted on CT Cont Vanc/Cefepime O2 PRN; currently on room air Consider transition to Levaquin or other PO option and potential discharge soon F/u blood cx  Abdominal Pain Elevated AST Not fitting with pancreatitis Will give clear liquid diet and ADAT Gentle fluid rehydration K-pad Ethanol level Avoid NSAID's with anemia PPI Reglan PRN  Generalized weakness and fall probably due to Symptomatic iron deficiency anemia Recent discharge 5/6 for above problem and received 1 u PRBC Cont PO iron PT/OT   Permanent atrial fibrillation Rate controlled not on anticoagulation due to GI bleed Cont diltiazem Tele  Chronic Diastolic Heart Failure Appears volume down Gentle fluid rehydration Not on home diuretic Daily weight I's and O's Mag, K daily  Diabetes mellitus type 2 with hyperglycemia Hgb A1c 8.0 in May 2021 Hold glimepiride as patient reports not eating much; sliding scale ordered  Essential hypertension Cont Diltiazem; BP's borderline low  and may need to hold  MDD Cont Celexa  COPD  Stable  Code Status: Full DVT Prophylaxis: Lovenox Family Communication: None Disposition Plan: Admit to inpatient. Patient requiring IV abx. Anticipate discharge home in 3-4 days. Patient is at high risk for further decompensation due to age and co-morbidities.    Time spent: 70 minutes  Chauncey Mann, MD [facility] Pager [phone]

## 2019-08-03 NOTE — Progress Notes (Signed)
Pharmacy Antibiotic Note  [name] is a 79 y.o. male admitted on 08/02/2019 with abdominal pain, possible sepsis/HCAP.  Pharmacy has been consulted for Vancomycin and Cefepime  dosing.  Plan: Vancomycin 1500 mg IV now, then Vancomycin 1000 mg IV q24h Cefepime 2 g IV q12h  Height: 5\' 6"  (167.6 cm) Weight: 75.3 kg (166 lb 0.1 oz) IBW/kg (Calculated) : 63.8  Temp (24hrs), Avg:98.5 F (36.9 C), Min:98.5 F (36.9 C), Max:98.5 F (36.9 C)  Recent Labs  Lab 07/28/19 1634 07/28/19 1634 07/29/19 0525 07/30/19 1000 07/30/19 1143 07/31/19 0453 08/01/19 0737 08/03/19 0110 08/03/19 0113  WBC 3.2*  --  8.0 4.3  --   --   --  7.1  --   CREATININE 1.38*   < > 1.33*  --  1.47* 1.53* 1.31*  --  1.10   < > = values in this interval not displayed.    Estimated Creatinine Clearance: 48.3 mL/min (by C-G formula based on SCr of 1.1 mg/dL).    Allergies  Allergen Reactions  . Cyclobenzaprine Other (See Comments)    Unsteady gait  . Other Other (See Comments)    Steroids speeds his heart rate    Caryl Pina 08/03/2019 5:31 AM

## 2019-08-03 NOTE — ED Provider Notes (Signed)
[facility] EMERGENCY DEPARTMENT Provider Note   CSN: [id number] Arrival date & time: 08/02/19  2355     History Chief Complaint  Patient presents with  . Abdominal Pain    [name] is a 79 y.o. male.  The history is provided by the patient.  Abdominal Pain Pain location:  LUQ Pain quality: sharp   Pain radiates to:  Does not radiate Pain severity:  Severe Onset quality:  Gradual Duration:  2 weeks Timing:  Constant Progression:  Worsening Chronicity:  New Context: not alcohol use   Relieved by:  Nothing Worsened by:  Nothing Ineffective treatments:  None tried Associated symptoms: no anorexia, no belching, no chest pain, no chills, no constipation, no cough, no diarrhea, no dysuria, no fatigue, no fever, no flatus, no hematemesis, no hematochezia, no hematuria, no melena, no nausea, no shortness of breath, no sore throat and no vomiting   Risk factors: being elderly        Past Medical History:  Diagnosis Date  . Allergic rhinitis   . Anemia    takes Ferrous Sulfate daily  . Arthritis    "all over"  . Atrial flutter ([place])   . Balance problem 01/2014  . CAP (community acquired pneumonia) 09/18/2014  . Cervical radiculopathy due to degenerative joint disease of spine   . Chronic coronary artery disease    a. 03/2010 Nonocclusive disease by cath, performed for ST elevations on ECG;  b. 06/2013 Lexi MV: EF 60%, no ischemia.  [name] COPD (chronic obstructive pulmonary disease) ([place])   . Diabetes mellitus type II    takes Metformin and Lantus daily  . Diastolic CHF, chronic ([place])    a. 12/2012 EF 55-60%, diast dysfxn, triv MR, mildly dil LA/RA.  [name] Dyspnea    with pain  . Essential hypertension    takes Diltiazem daily  . Frail elderly   . GERD (gastroesophageal reflux disease)   . History of blood transfusion 1982   "when I had stomach OR"  . History of bronchitis    1998  . History of CVA (cerebrovascular accident)   . History of gastric  ulcer   . History of kidney stones   . Hypercholesterolemia    takes Pravastatin daily  . Intrinsic eczema   . Legally blind in left eye, as defined in [place]   . Obesity   . PAF (paroxysmal atrial fibrillation) (HCC)    Recurrent after atrial flutter (a. 07/2010 Status post caval tricuspid isthmus ablation by Dr. Midge Aver Metoprolol daily), currently controlled on flecainide plus diltiazem and Coumadin  . Urinary retention   . Vitamin D deficiency   . Weakness    numbness and tingling both hands    Patient Active Problem List   Diagnosis Date Noted  . Cardiac pacemaker in situ 10/29/2018  . Major depressive disorder, recurrent severe without psychotic features ([place]) 04/15/2018  . Atrial fibrillation with RVR ([place]) 11/08/2017  . HTN (hypertension) 05/21/2017  . Diabetes mellitus, type II ([place]) 05/21/2017  . Diastolic CHF, chronic ([place]) 05/21/2017  . COPD (chronic obstructive pulmonary disease) ([place]) 05/21/2017  . H/O atrial flutter 05/21/2017  . Heme positive stool   . Symptomatic anemia 07/10/2016  . Cardiac syncope 05/15/2015  . Cervical disc disease 05/15/2015  . Anemia 05/15/2015  . History of CVA (cerebrovascular accident) 04/03/2014  . Benign neoplasm of rectum and anal canal 12/02/2013  . Tobacco use disorder 11/30/2013  . Mixed hyperlipidemia 06/06/2013  . Type 2 diabetes mellitus ([place])  01/22/2013  . Obesity (BMI 30-39.9) 12/14/2012  . Persistent atrial fibrillation ([place]) 10/07/2010  . Atrial flutter - status post CTI ablation 07/29/2010  . Essential hypertension 07/29/2010  . Chronic diastolic congestive heart failure ([place]) 07/29/2010  . Atherosclerotic heart disease of native coronary artery with other forms of angina pectoris ([place]) 07/29/2010    Past Surgical History:  Procedure Laterality Date  . ATRIAL ABLATION SURGERY  08/05/10   CTI ablation for atrial flutter by [facility]  . CARDIAC CATHETERIZATION  2012   nl LV function, no occlusive CAD, PAF  . CARDIOVERSION   12/07/2010    Successful direct current cardioversion with atrial fibrillation to normal sinus rhythm  . CARPAL TUNNEL RELEASE Bilateral 01/30/2014   Procedure: BILATERAL CARPAL TUNNEL RELEASE;  Surgeon: Marianna Payment, MD;  Location: [facility];  Service: Orthopedics;  Laterality: Bilateral;  . CATARACT EXTRACTION W/ INTRAOCULAR LENS  IMPLANT, BILATERAL Bilateral   . COLONOSCOPY [name] 12/02/2013   Procedure: COLONOSCOPY;  Surgeon: Irene Shipper, MD;  Location: [facility];  Service: Endoscopy;  Laterality: [name];  . ESOPHAGOGASTRODUODENOSCOPY [name] 09/22/2014   Procedure: ESOPHAGOGASTRODUODENOSCOPY (EGD);  Surgeon: Ronald Lobo, MD;  Location[facility] ENDOSCOPY;  Service: Endoscopy;  Laterality: [name];  . ESOPHAGOGASTRODUODENOSCOPY [name] 07/11/2016   Procedure: ESOPHAGOGASTRODUODENOSCOPY (EGD);  Surgeon: Doran Stabler, MD;  Location: [facility] ENDOSCOPY;  Service: Endoscopy;  Laterality: [name];  . EXTRACORPOREAL SHOCK WAVE LITHOTRIPSY Right 07/15/2018   Procedure: EXTRACORPOREAL SHOCK WAVE LITHOTRIPSY (ESWL);  Surgeon: Lucas Mallow, MD;  Location: [facility];  Service: Urology;  Laterality: Right;  . INCISION AND DRAINAGE ABSCESS / HEMATOMA OF BURSA / KNEE / THIGH Left 1998   knee  . KNEE BURSECTOMY Left 1998  . LAPAROSCOPIC CHOLECYSTECTOMY  03/2010  . LEFT HEART CATH AND CORONARY ANGIOGRAPHY [name] 10/05/2017   Procedure: LEFT HEART CATH AND CORONARY ANGIOGRAPHY;  Surgeon: Martinique, Peter M, MD;  Location: [facility] CV LAB;  Service: Cardiovascular;  Laterality: [name];  . NM MYOVIEW LTD  07/22/2013   Normal EF ~60%, no ischemia or infarction.  [name] PACEMAKER IMPLANT [name] 10/21/2018   Procedure: PACEMAKER IMPLANT;  Surgeon: Deboraha Sprang, MD;  Location: [facility] CV LAB;  Service: Cardiovascular;  Laterality: [name];  . PARTIAL GASTRECTOMY  1982   subtotal; "took out 30% for ulcers"  . TRANSTHORACIC ECHOCARDIOGRAM  02/16/2014   EF 60%, no RWMA. - otherwise normal  . YAG LASER APPLICATION Bilateral        Family  History  Problem Relation Age of Onset  . Breast cancer Mother   . Diabetes Mother   . Kidney disease Mother   . Heart attack Father   . Heart disease Father   . Heart attack Brother   . Leukemia Daughter     Social History   Tobacco Use  . Smoking status: Former Smoker    Packs/day: 0.25    Years: 57.00    Pack years: 14.25    Types: Cigarettes  . Smokeless tobacco: Never Used  . Tobacco comment: He's been smoking between 0.5 and 2 ppd since age 40. QUIT 3 MONTHS AGO  Substance Use Topics  . Alcohol use: No    Alcohol/week: 0.0 standard drinks    Comment: "quit drinking in 1986"  . Drug use: No    Home Medications Prior to Admission medications   Medication Sig Start Date End Date Taking? Authorizing Provider  aspirin EC 81 MG tablet Take 81 mg by mouth daily.   Yes [provider]  citalopram (CELEXA) 20 MG tablet Take 1 tablet (20 mg total) by mouth daily. 10/02/18  Yes Scot Jun, FNP  diltiazem (CARDIZEM CD) 120 MG 24 hr capsule Take 1 capsule (120 mg total) by mouth 2 (two) times daily. 04/07/19  Yes Camnitz, Will Hassell Done, MD  ferrous sulfate 325 (65 FE) MG tablet Take 325 mg by mouth daily with breakfast.   Yes [provider]  gabapentin (NEURONTIN) 100 MG capsule Take 1 capsule (100 mg total) by mouth at bedtime. 04/22/19  Yes Ladell Pier, MD  glimepiride (AMARYL) 2 MG tablet Take 0.5 tablets (1 mg total) by mouth daily before breakfast. 04/22/19  Yes Ladell Pier, MD  nitroGLYCERIN (NITROSTAT) 0.4 MG SL tablet Place 1 tablet (0.4 mg total) under the tongue every 5 (five) minutes x 3 doses as needed for chest pain. 11/19/15  Yes Kilroy, Luke K, PA-C  pantoprazole (PROTONIX) 40 MG tablet Take 1 tablet (40 mg total) by mouth 2 (two) times daily before a meal. Dx: K21.9 07/01/19  Yes Nicolette Bang, DO  pravastatin (PRAVACHOL) 40 MG tablet Take 1 tablet (40 mg total) by mouth every evening. 07/01/19  Yes Nicolette Bang, DO   tamsulosin (FLOMAX) 0.4 MG CAPS capsule Take 1 capsule (0.4 mg total) by mouth daily. 10/02/18  Yes Scot Jun, FNP  Accu-Chek FastClix Lancets MISC USE TO TEST BLOOD SUGAR TWICE DAILY. E11.00 12/04/18   Charlott Rakes, MD  glucose blood (ACCU-CHEK GUIDE) test strip USE TO TEST BLOOD SUGAR TWICE DAILY. E11.00 12/19/18   Charlott Rakes, MD    Allergies    Cyclobenzaprine and Other  Review of Systems   Review of Systems  Constitutional: Negative for chills, fatigue and fever.  HENT: Negative for sore throat.   Respiratory: Negative for cough and shortness of breath.   Cardiovascular: Negative for chest pain.  Gastrointestinal: Positive for abdominal pain. Negative for anorexia, constipation, diarrhea, flatus, hematemesis, hematochezia, melena, nausea and vomiting.  Genitourinary: Negative for dysuria and hematuria.  All other systems reviewed and are negative.   Physical Exam Updated Vital Signs BP (!) 122/49   Pulse 95   Temp 98.5 F (36.9 C) (Oral)   Resp 17   Ht 5\' 6"  (1.676 m)   Wt 75.3 kg   SpO2 95%   BMI 26.79 kg/m   Physical Exam Vitals and nursing note reviewed.  Constitutional:      General: He is not in acute distress.    Appearance: Normal appearance.  HENT:     Head: Normocephalic and atraumatic.     Nose: Nose normal.  Eyes:     Conjunctiva/sclera: Conjunctivae normal.     Pupils: Pupils are equal, round, and reactive to light.  Cardiovascular:     Rate and Rhythm: Normal rate and regular rhythm.     Pulses: Normal pulses.     Heart sounds: Normal heart sounds.  Pulmonary:     Effort: Pulmonary effort is normal.     Breath sounds: Normal breath sounds.  Abdominal:     General: Abdomen is flat.     Palpations: There is no mass.     Tenderness: There is no abdominal tenderness. There is no guarding.     Hernia: No hernia is present.  Musculoskeletal:        General: Normal range of motion.     Cervical back: Normal range of motion and neck  supple.  Skin:    General: Skin is warm and dry.  Capillary Refill: Capillary refill takes less than 2 seconds.  Neurological:     General: No focal deficit present.     Mental Status: He is alert and oriented to person, place, and time.  Psychiatric:        Mood and Affect: Mood normal.        Behavior: Behavior normal.     ED Results / Procedures / Treatments   Labs (all labs ordered are listed, but only abnormal results are displayed) Results for orders placed or performed during the hospital encounter of 08/02/19  CBC with Differential/Platelet  Result Value Ref Range   WBC 7.1 4.0 - 10.5 K/uL   RBC 3.88 (L) 4.22 - 5.81 MIL/uL   Hemoglobin 9.9 (L) 13.0 - 17.0 g/dL   HCT 33.5 (L) 39.0 - 52.0 %   MCV 86.3 80.0 - 100.0 fL   MCH 25.5 (L) 26.0 - 34.0 pg   MCHC 29.6 (L) 30.0 - 36.0 g/dL   RDW 18.3 (H) 11.5 - 15.5 %   Platelets 142 (L) 150 - 400 K/uL   nRBC 0.0 0.0 - 0.2 %   Neutrophils Relative % 75 %   Neutro Abs 5.3 1.7 - 7.7 K/uL   Lymphocytes Relative 6 %   Lymphs Abs 0.5 (L) 0.7 - 4.0 K/uL   Monocytes Relative 17 %   Monocytes Absolute 1.2 (H) 0.1 - 1.0 K/uL   Eosinophils Relative 1 %   Eosinophils Absolute 0.1 0.0 - 0.5 K/uL   Basophils Relative 1 %   Basophils Absolute 0.0 0.0 - 0.1 K/uL   Immature Granulocytes 0 %   Abs Immature Granulocytes 0.03 0.00 - 0.07 K/uL  Hepatic function panel  Result Value Ref Range   Total Protein 9.4 (H) 6.5 - 8.1 g/dL   Albumin 2.6 (L) 3.5 - 5.0 g/dL   AST 48 (H) 15 - 41 U/L   ALT 34 0 - 44 U/L   Alkaline Phosphatase 141 (H) 38 - 126 U/L   Total Bilirubin 0.8 0.3 - 1.2 mg/dL   Bilirubin, Direct 0.2 0.0 - 0.2 mg/dL   Indirect Bilirubin 0.6 0.3 - 0.9 mg/dL  Lipase, blood  Result Value Ref Range   Lipase 65 (H) 11 - 51 U/L  I-stat chem 8, ED (not at [facility] or [facility])  Result Value Ref Range   Sodium 134 (L) 135 - 145 mmol/L   Potassium 3.6 3.5 - 5.1 mmol/L   Chloride 100 98 - 111 mmol/L   BUN 15 8 - 23 mg/dL   Creatinine, Ser  1.10 0.61 - 1.24 mg/dL   Glucose, Bld 215 (H) 70 - 99 mg/dL   Calcium, Ion 1.20 1.15 - 1.40 mmol/L   TCO2 22 22 - 32 mmol/L   Hemoglobin 11.2 (L) 13.0 - 17.0 g/dL   HCT 33.0 (L) 39.0 - 52.0 %   DG Ribs Unilateral W/Chest Left  Result Date: 07/28/2019 CLINICAL DATA:  Multiple recent falls.  Left hip, rib and chest pain EXAM: LEFT RIBS AND CHEST - 3+ VIEW COMPARISON:  None. FINDINGS: No fracture or other bone lesions are seen involving the ribs. There is no evidence of pneumothorax or pleural effusion. Mild bilateral interstitial thickening similar in appearance to the prior exams. No focal consolidation, pleural effusion or pneumothorax. Heart size and mediastinal contours are within normal limits. Dual lead cardiac pacemaker. IMPRESSION: Negative. Electronically Signed   By: Kathreen Devoid   On: 07/28/2019 17:06   DG Abd 1 View  Result Date: 07/28/2019  CLINICAL DATA:  Multiple recent falls.  Left hip rib and chest pain EXAM: ABDOMEN - 1 VIEW COMPARISON:  None. FINDINGS: Prior cholecystectomy. Nonobstructive bowel gas pattern. No organomegaly or free air. Degenerative changes within the lumbar spine. No acute bony abnormality. IMPRESSION: No acute findings. Electronically Signed   By: Rolm Baptise M.D.   On: 07/28/2019 17:33   CT Head Wo Contrast  Result Date: 07/28/2019 CLINICAL DATA:  79 year old male with trauma. EXAM: CT HEAD WITHOUT CONTRAST CT CERVICAL SPINE WITHOUT CONTRAST TECHNIQUE: Multidetector CT imaging of the head and cervical spine was performed following the standard protocol without intravenous contrast. Multiplanar CT image reconstructions of the cervical spine were also generated. COMPARISON:  CT dated 11/14/2018. FINDINGS: CT HEAD FINDINGS Brain: Moderate age-related atrophy and chronic microvascular ischemic changes. There is no acute intracranial hemorrhage. No mass effect or midline shift. No extra-axial fluid collection. Vascular: No hyperdense vessel or unexpected calcification.  Skull: Normal. Negative for fracture or focal lesion. Sinuses/Orbits: No acute finding. Other: None. CT CERVICAL SPINE FINDINGS Alignment: No acute subluxation. There is straightening of normal cervical lordosis which may be positional or due to muscle spasm or chronic. Skull base and vertebrae: No acute fracture. 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 There has been interval increase in the distraction gap since the prior CT. Soft tissues and spinal canal: No prevertebral fluid or swelling. No visible canal hematoma. Disc levels:  Multilevel degenerative changes with bone spurring. Upper chest: Negative. Other: Partially visualized pacemaker wire. Bilateral carotid bulb calcified plaques. Probable subcutaneous lipoma of the posterior neck. IMPRESSION: 1. No acute intracranial pathology. Moderate age-related atrophy and chronic microvascular ischemic changes. 2. No acute/traumatic cervical spine pathology. Old fracture of the dens with nonunion and increased distraction. Electronically Signed   By: Anner Crete M.D.   On: 07/28/2019 21:54   CT Chest W Contrast  Result Date: 07/28/2019 CLINICAL DATA:  79 year old male with trauma. Left-sided chest pain. EXAM: CT CHEST, ABDOMEN, AND PELVIS WITH CONTRAST TECHNIQUE: Multidetector CT imaging of the chest, abdomen and pelvis was performed following the standard protocol during bolus administration of intravenous contrast. CONTRAST:  154mL OMNIPAQUE IOHEXOL 350 MG/ML SOLN COMPARISON:  Chest radiograph dated 11/14/2018. FINDINGS: CT CHEST FINDINGS Cardiovascular: There is no cardiomegaly or pericardial effusion. There is 3 vessel coronary vascular calcification. Left-sided pacemaker. There is moderate atherosclerotic calcification of the thoracic aorta. No aneurysmal dilatation or dissection. The origins of the great vessels of the aortic arch appear patent as visualized. The central pulmonary arteries are grossly unremarkable.  Mediastinum/Nodes: There is no hilar or mediastinal adenopathy. The esophagus and the thyroid gland are grossly unremarkable. No mediastinal fluid collection or hematoma. Lungs/Pleura: The lungs are clear. There is a 4 mm right middle lobe calcified granuloma. There is no pleural effusion or pneumothorax. The central airways are patent. Musculoskeletal: No acute osseous pathology. Degenerative changes of the spine. CT ABDOMEN PELVIS FINDINGS No intra-abdominal free air. Small free fluid within the pelvis. Hepatobiliary: Fatty infiltration of the liver with morphologic changes of early cirrhosis. Cholecystectomy. Pancreas: Mild stranding of the peripancreatic fat may be related to underlying liver disease. Correlation with pancreatic enzymes recommended to exclude pancreatitis. Spleen: Normal in size without focal abnormality. Adrenals/Urinary Tract: The adrenal glands are unremarkable. There is no hydronephrosis on either side. There is symmetric enhancement and excretion of contrast by both kidneys. The visualized ureters appear unremarkable. The urinary bladder is decompressed around a Foley catheter. Two adjacent calcific foci with the  larger measuring 4 mm along the bladder wall adjacent to the balloon of the Foley catheter may represent focal calcification of the bladder wall or stone within the bladder. Stomach/Bowel: Postsurgical changes of the gastroesophageal junction. There is postsurgical changes of the bowel. There is moderate stool throughout the colon. There are scattered colonic diverticula without active inflammatory changes. There is no bowel obstruction or active inflammation. The appendix is normal. Vascular/Lymphatic: Advanced aortoiliac atherosclerotic disease. There is a 2.5 cm infrarenal aortic ectasia. The IVC is unremarkable. No portal venous gas. There is no adenopathy. Reproductive: The prostate is suboptimally visualized. Other: Mild diffuse subcutaneous edema. Musculoskeletal: Osteopenia  with degenerative changes of the spine. No acute osseous pathology. IMPRESSION: 1. No traumatic intrathoracic, abdominal, or pelvic pathology. 2. Fatty liver with morphologic changes of early cirrhosis. 3. Mild stranding of the peripancreatic fat may be related to underlying liver disease. Correlation with pancreatic enzymes recommended to exclude pancreatitis. 4. Colonic diverticulosis. No bowel obstruction. Normal appendix. 5. Aortic Atherosclerosis (ICD10-I70.0). Electronically Signed   By: Anner Crete M.D.   On: 07/28/2019 22:19   CT Cervical Spine Wo Contrast  Result Date: 07/28/2019 CLINICAL DATA:  79 year old male with trauma. EXAM: CT HEAD WITHOUT CONTRAST CT CERVICAL SPINE WITHOUT CONTRAST TECHNIQUE: Multidetector CT imaging of the head and cervical spine was performed following the standard protocol without intravenous contrast. Multiplanar CT image reconstructions of the cervical spine were also generated. COMPARISON:  CT dated 11/14/2018. FINDINGS: CT HEAD FINDINGS Brain: Moderate age-related atrophy and chronic microvascular ischemic changes. There is no acute intracranial hemorrhage. No mass effect or midline shift. No extra-axial fluid collection. Vascular: No hyperdense vessel or unexpected calcification. Skull: Normal. Negative for fracture or focal lesion. Sinuses/Orbits: No acute finding. Other: None. CT CERVICAL SPINE FINDINGS Alignment: No acute subluxation. There is straightening of normal cervical lordosis which may be positional or due to muscle spasm or chronic. Skull base and vertebrae: No acute fracture. 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 There has been interval increase in the distraction gap since the prior CT. Soft tissues and spinal canal: No prevertebral fluid or swelling. No visible canal hematoma. Disc levels:  Multilevel degenerative changes with bone spurring. Upper chest: Negative. Other: Partially visualized pacemaker wire. Bilateral carotid  bulb calcified plaques. Probable subcutaneous lipoma of the posterior neck. IMPRESSION: 1. No acute intracranial pathology. Moderate age-related atrophy and chronic microvascular ischemic changes. 2. No acute/traumatic cervical spine pathology. Old fracture of the dens with nonunion and increased distraction. Electronically Signed   By: Anner Crete M.D.   On: 07/28/2019 21:54   CT ABDOMEN PELVIS W CONTRAST  Result Date: 07/28/2019 CLINICAL DATA:  79 year old male with trauma. Left-sided chest pain. EXAM: CT CHEST, ABDOMEN, AND PELVIS WITH CONTRAST TECHNIQUE: Multidetector CT imaging of the chest, abdomen and pelvis was performed following the standard protocol during bolus administration of intravenous contrast. CONTRAST:  178mL OMNIPAQUE IOHEXOL 350 MG/ML SOLN COMPARISON:  Chest radiograph dated 11/14/2018. FINDINGS: CT CHEST FINDINGS Cardiovascular: There is no cardiomegaly or pericardial effusion. There is 3 vessel coronary vascular calcification. Left-sided pacemaker. There is moderate atherosclerotic calcification of the thoracic aorta. No aneurysmal dilatation or dissection. The origins of the great vessels of the aortic arch appear patent as visualized. The central pulmonary arteries are grossly unremarkable. Mediastinum/Nodes: There is no hilar or mediastinal adenopathy. The esophagus and the thyroid gland are grossly unremarkable. No mediastinal fluid collection or hematoma. Lungs/Pleura: The lungs are clear. There is a 4 mm right middle  lobe calcified granuloma. There is no pleural effusion or pneumothorax. The central airways are patent. Musculoskeletal: No acute osseous pathology. Degenerative changes of the spine. CT ABDOMEN PELVIS FINDINGS No intra-abdominal free air. Small free fluid within the pelvis. Hepatobiliary: Fatty infiltration of the liver with morphologic changes of early cirrhosis. Cholecystectomy. Pancreas: Mild stranding of the peripancreatic fat may be related to underlying liver  disease. Correlation with pancreatic enzymes recommended to exclude pancreatitis. Spleen: Normal in size without focal abnormality. Adrenals/Urinary Tract: The adrenal glands are unremarkable. There is no hydronephrosis on either side. There is symmetric enhancement and excretion of contrast by both kidneys. The visualized ureters appear unremarkable. The urinary bladder is decompressed around a Foley catheter. Two adjacent calcific foci with the larger measuring 4 mm along the bladder wall adjacent to the balloon of the Foley catheter may represent focal calcification of the bladder wall or stone within the bladder. Stomach/Bowel: Postsurgical changes of the gastroesophageal junction. There is postsurgical changes of the bowel. There is moderate stool throughout the colon. There are scattered colonic diverticula without active inflammatory changes. There is no bowel obstruction or active inflammation. The appendix is normal. Vascular/Lymphatic: Advanced aortoiliac atherosclerotic disease. There is a 2.5 cm infrarenal aortic ectasia. The IVC is unremarkable. No portal venous gas. There is no adenopathy. Reproductive: The prostate is suboptimally visualized. Other: Mild diffuse subcutaneous edema. Musculoskeletal: Osteopenia with degenerative changes of the spine. No acute osseous pathology. IMPRESSION: 1. No traumatic intrathoracic, abdominal, or pelvic pathology. 2. Fatty liver with morphologic changes of early cirrhosis. 3. Mild stranding of the peripancreatic fat may be related to underlying liver disease. Correlation with pancreatic enzymes recommended to exclude pancreatitis. 4. Colonic diverticulosis. No bowel obstruction. Normal appendix. 5. Aortic Atherosclerosis (ICD10-I70.0). Electronically Signed   By: Anner Crete M.D.   On: 07/28/2019 22:19   CUP PACEART REMOTE DEVICE CHECK  Result Date: 07/21/2019 Scheduled remote reviewed. Normal device function.  55 AMS (84%). Known persistent AF, no OAC due  to GIB. 4 new HVR episodes (longest 15 min 42 sec); appears AF/RVR. Next remote 91 days. Felisa Bonier, RN, MSN  DG Hip Unilat W or Wo Pelvis 1 View Left  Result Date: 07/28/2019 CLINICAL DATA:  Fall, left hip pain EXAM: DG HIP (WITH OR WITHOUT PELVIS) 1V*L* COMPARISON:  04/15/2018 FINDINGS: Hip joints are symmetric. Joint spaces maintained. No acute bony abnormality. Specifically, no fracture, subluxation, or dislocation. IMPRESSION: No acute bony abnormality. Electronically Signed   By: Rolm Baptise M.D.   On: 07/28/2019 17:34    None  Radiology No results found.  Procedures Procedures (including critical care time)  Medications Ordered in ED Medications  ketorolac (TORADOL) 30 MG/ML injection 30 mg (has no administration in time range)  ceFEPIme (MAXIPIME) 2 g in sodium chloride 0.9 % 100 mL IVPB (has no administration in time range)  vancomycin (VANCOREADY) IVPB 1500 mg/300 mL (has no administration in time range)  fentaNYL (SUBLIMAZE) injection 25 mcg (25 mcg Intravenous Given 08/03/19 0140)  iohexol (OMNIPAQUE) 300 MG/ML solution 100 mL (100 mLs Intravenous Contrast Given 08/03/19 0355)    ED Course  I have reviewed the triage vital signs and the nursing notes.  Pertinent labs & imaging results that were available during my care of the patient were reviewed by me and considered in my medical decision making (see chart for details).    Patient continues to complain of pain.  HCAP found on CT abdomen and pelvis.  COVID ordered will admit to medicine   Final  Clinical Impression(s) / ED Diagnoses Admit to medicine    Maleeha Halls, MD 08/03/19 [id number]

## 2019-08-03 NOTE — ED Notes (Signed)
Called lab to add on the strep urine and the legionella urine

## 2019-08-03 NOTE — Progress Notes (Signed)
PROGRESS NOTE    [name]  [id number] DOB: Oct 20, 1940 DOA: 08/02/2019 PCP: Nicolette Bang, DO   Brief Narrative:  [name] is a 79 y.o. male with PMH of chronic normocytic anemia atrial flutter/atrial fibrillation (not on anticoagulation due to history of GI bleed), chronic diastolic heart failure, COPD, T2DM, nonhemorrhagic CVA who presented to ED with abdominal pain and found to have HCAP. Patient is a poor historian but reports epigastric and LUQ/LLQ pain for several weeks. Reports pain is sharp but does not radiate. Reports constant pain and worsening. Nothing has helped pain, worse by eating certain foods and reports he is eating "light" foods only. Reports that he is very hungry and thirsty currently. He believes he is having an episode of pancreatitis which he has had before. Reports cough and SOB for several weeks as well. Denies headache, dizziness, fever, chills, chest pain, nausea, vomiting, diarrhea, constipation, dysuria, hematuria, hematochezia, melena, difficulty moving arms/legs, speech difficulty, confusion or any other complaints. In the ED: Vitals stable on room air. Labs remarkable for WBC 7.1, Hgb 9.9, AST 48, Lipase 65, glucose 215. UA also appears infected but patient has chronic catheter in place.  Assessment & Plan:   Principal Problem:   HCAP (healthcare-associated pneumonia) Active Problems:   Essential hypertension   Chronic diastolic congestive heart failure (HCC)   Persistent atrial fibrillation (HCC)   Mixed hyperlipidemia   Type 2 diabetes mellitus (HCC)   COPD (chronic obstructive pulmonary disease) (HCC)   Major depressive disorder, recurrent severe without psychotic features ([place])  Acute intractable abdominal pain in the setting of likely constipation, POA - Continue bowel regimen, CT remarkable for large stool burden - Consider NG tube placement given large volume noted in the stomach, hold off for now given patient has no  complaints of nausea or vomiting - If no bowel movement in the next 12 to 24 hours consider advancing treatment with enemas, suppository  Questionable HCAP pneumonia, POA -Continue cefepime, vancomycin -CT abdomen shows small what appears to be infiltrate in the left lower lobe -Patient does complain of pain in the left lower quadrant of the chest versus left upper quadrant of the abdomen unable to differentiate between distended stomach pain constipation and lower pneumonia  Generalized weakness, ambulatory dysfunction, acute on chronic -Appears to be acute on chronic, recently admitted with symptomatic anemia, ongoing ambulatory dysfunction now appears to be in the setting of acute pneumonia and constipation -Continue PT OT, we discussed possible need for physical therapy and SNF placement, patient hesitant to be discharged anywhere but home with his family  A. fib, chronic -rate controlled, not on anticoagulation -Currently rate controlled, patient has history of GI bleed, unable to tolerate anticoagulation -Continue diltiazem  Diastolic dysfunction heart failure, not in acute exacerbation -Continue home medications; appears euvolemic  Diabetes type 2, non-insulin-dependent, uncontrolled -Continue sliding scale insulin, hypoglycemic protocol -A1c previously 8.0, no indication to recheck  Hypertension, essential -Resume home medications once indicated, blood pressure currently borderline low  COPD, without chronic hypoxia, no acute exacerbation -Continue home inhaled medications -No wheeze on exam, hold off on steroids but continue antibiotics as above for presumed HCAP  DVT prophylaxis: SCDs only, history of GI bleed unable to tolerate anticoagulation Code Status: Full code Family Communication: None available  Status is: Inpatient  Dispo: The patient is from: Home              Anticipated d/c is to: To be determined  Anticipated d/c date is: 24 to 48 hours  pending clinical course              Patient currently not medically stable for discharge given ongoing abdominal pain, constipation, pneumonia requiring IV antibiotics poor p.o. intake requiring IV fluids  Consultants:   None  Procedures:   None  Antimicrobials:  Cefepime, vancomycin  Subjective: No acute issues or events overnight, patient's abdominal pain is ongoing, continues to decline recent bowel movement.  Declines headache, fever, chills, nausea, vomiting, diarrhea.  Objective: Vitals:   08/03/19 0015 08/03/19 0030 08/03/19 0315 08/03/19 0700  BP: 132/60 (!) 122/49 113/65 (!) 101/45  Pulse: 96 95 97 97  Resp: 19 17 19 18   Temp:      TempSrc:      SpO2: 96% 95% 91% 92%  Weight:      Height:       No intake or output data in the 24 hours ending 08/03/19 0743 Filed Weights   08/02/19 2359  Weight: 75.3 kg    Examination:  General:  Pleasantly resting in bed, No acute distress. HEENT:  Normocephalic atraumatic.  Sclerae nonicteric, noninjected.  Extraocular movements intact bilaterally. Neck:  Without mass or deformity.  Trachea is midline. Lungs:  Clear to auscultate bilaterally without rhonchi, wheeze, or rales. Heart:  Regular rate and rhythm.  Without murmurs, rubs, or gallops. Abdomen: Diffusely tender, no PMI without rebound or guarding. Extremities: Without cyanosis, clubbing, edema, or obvious deformity. Vascular:  Dorsalis pedis and posterior tibial pulses palpable bilaterally. Skin:  Warm and dry, no erythema  Data Reviewed: I have personally reviewed following labs and imaging studies  CBC: Recent Labs  Lab 07/28/19 1634 07/29/19 0525 07/30/19 1000 08/03/19 0110 08/03/19 0113  WBC 3.2* 8.0 4.3 7.1  --   NEUTROABS 1.9  --   --  5.3  --   HGB 7.6* 9.2* 8.9* 9.9* 11.2*  HCT 24.5* 30.2* 29.2* 33.5* 33.0*  MCV 84.5 85.1 84.9 86.3  --   PLT PLATELET CLUMPS NOTED ON SMEAR, UNABLE TO ESTIMATE 122* 133* 142*  --    Basic Metabolic Panel: Recent  Labs  Lab 07/28/19 1634 07/28/19 1634 07/29/19 0525 07/30/19 1143 07/31/19 0453 08/01/19 0737 08/03/19 0113  NA 137   < > 138 134* 136 135 134*  K 3.8   < > 3.3* 4.2 4.2 3.9 3.6  CL 106   < > 105 104 107 105 100  CO2 21*  --  20* 19* 20* 21*  --   GLUCOSE 192*   < > 158* 196* 147* 138* 215*  BUN 21   < > 18 20 19 13 15   CREATININE 1.38*   < > 1.33* 1.47* 1.53* 1.31* 1.10  CALCIUM 9.2  --  9.3 8.6* 8.9 8.7*  --   MG  --   --  1.7  --   --   --   --   PHOS  --   --  3.0  --   --   --   --    < > = values in this interval not displayed.   GFR: Estimated Creatinine Clearance: 48.3 mL/min (by C-G formula based on SCr of 1.1 mg/dL). Liver Function Tests: Recent Labs  Lab 07/28/19 1634 07/29/19 0525 08/03/19 0110  AST 39 44* 48*  ALT 29 31 34  ALKPHOS 138* 133* 141*  BILITOT 0.8 1.3* 0.8  PROT 8.9* 8.8* 9.4*  ALBUMIN 2.6* 2.5* 2.6*   Recent Labs  Lab  07/28/19 1634 08/03/19 0111  LIPASE 36 65*   No results for input(s): AMMONIA in the last 168 hours. Coagulation Profile: No results for input(s): INR, PROTIME in the last 168 hours. Cardiac Enzymes: No results for input(s): CKTOTAL, CKMB, CKMBINDEX, TROPONINI in the last 168 hours. BNP (last 3 results) No results for input(s): PROBNP in the last 8760 hours. HbA1C: No results for input(s): HGBA1C in the last 72 hours. CBG: Recent Labs  Lab 07/31/19 1112 07/31/19 1639 07/31/19 2105 08/01/19 0606 08/01/19 1231  GLUCAP 211* 192* 252* 149* 287*   Lipid Profile: No results for input(s): CHOL, HDL, LDLCALC, TRIG, CHOLHDL, LDLDIRECT in the last 72 hours. Thyroid Function Tests: No results for input(s): TSH, T4TOTAL, FREET4, T3FREE, THYROIDAB in the last 72 hours. Anemia Panel: No results for input(s): VITAMINB12, FOLATE, FERRITIN, TIBC, IRON, RETICCTPCT in the last 72 hours. Sepsis Labs: No results for input(s): PROCALCITON, LATICACIDVEN in the last 168 hours.  Recent Results (from the past 240 hour(s))  Urine  culture     Status: Abnormal   Collection Time: 07/28/19  4:34 PM   Specimen: Urine, Random  Result Value Ref Range Status   Specimen Description URINE, RANDOM  Final   Special Requests   Final    NONE Performed at [facility], [address] [place], [place] [address]    Culture MULTIPLE SPECIES PRESENT, SUGGEST RECOLLECTION (A)  Final   Report Status 07/29/2019 FINAL  Final  Respiratory Panel by RT PCR (Flu A&B, Covid) - Nasopharyngeal Swab     Status: None   Collection Time: 07/28/19  8:20 PM   Specimen: Nasopharyngeal Swab  Result Value Ref Range Status   SARS Coronavirus 2 by RT PCR NEGATIVE NEGATIVE Final    Comment: (NOTE) SARS-CoV-2 target nucleic acids are NOT DETECTED. The SARS-CoV-2 RNA is generally detectable in upper respiratoy specimens during the acute phase of infection. The lowest concentration of SARS-CoV-2 viral copies this assay can detect is 131 copies/mL. A negative result does not preclude SARS-Cov-2 infection and should not be used as the sole basis for treatment or other patient management decisions. A negative result may occur with  improper specimen collection/handling, submission of specimen other than nasopharyngeal swab, presence of viral mutation(s) within the areas targeted by this assay, and inadequate number of viral copies (<131 copies/mL). A negative result must be combined with clinical observations, patient history, and epidemiological information. The expected result is Negative. Fact Sheet for Patients:  PinkCheek.be Fact Sheet for Healthcare Providers:  GravelBags.it This test is not yet ap proved or cleared by the [place] FDA and  has been authorized for detection and/or diagnosis of SARS-CoV-2 by FDA under an Emergency Use Authorization (EUA). This EUA will remain  in effect (meaning this test can be used) for the duration of the COVID-19 declaration under Section  564(b)(1) of the Act, 21 U.S.C. section 360bbb-3(b)(1), unless the authorization is terminated or revoked sooner.    Influenza A by PCR NEGATIVE NEGATIVE Final   Influenza B by PCR NEGATIVE NEGATIVE Final    Comment: (NOTE) The Xpert Xpress SARS-CoV-2/FLU/RSV assay is intended as an aid in  the diagnosis of influenza from Nasopharyngeal swab specimens and  should not be used as a sole basis for treatment. Nasal washings and  aspirates are unacceptable for Xpert Xpress SARS-CoV-2/FLU/RSV  testing. Fact Sheet for Patients: PinkCheek.be Fact Sheet for Healthcare Providers: GravelBags.it This test is not yet approved or cleared by the [place] FDA and  has  been authorized for detection and/or diagnosis of SARS-CoV-2 by  FDA under an Emergency Use Authorization (EUA). This EUA will remain  in effect (meaning this test can be used) for the duration of the  Covid-19 declaration under Section 564(b)(1) of the Act, 21  U.S.C. section 360bbb-3(b)(1), unless the authorization is  terminated or revoked. Performed at [facility], [place] [address], [place], [place] [address]   SARS CORONAVIRUS 2 (TAT 6-24 HRS) Nasopharyngeal Nasopharyngeal Swab     Status: None   Collection Time: 08/01/19 12:28 AM   Specimen: Nasopharyngeal Swab  Result Value Ref Range Status   SARS Coronavirus 2 NEGATIVE NEGATIVE Final    Comment: (NOTE) SARS-CoV-2 target nucleic acids are NOT DETECTED. The SARS-CoV-2 RNA is generally detectable in upper and lower respiratory specimens during the acute phase of infection. Negative results do not preclude SARS-CoV-2 infection, do not rule out co-infections with other pathogens, and should not be used as the sole basis for treatment or other patient management decisions. Negative results must be combined with clinical observations, patient history, and epidemiological information. The expected result is  Negative. Fact Sheet for Patients: SugarRoll.be Fact Sheet for Healthcare Providers: https://www.woods-mathews.com/ This test is not yet approved or cleared by the [place] FDA and  has been authorized for detection and/or diagnosis of SARS-CoV-2 by FDA under an Emergency Use Authorization (EUA). This EUA will remain  in effect (meaning this test can be used) for the duration of the COVID-19 declaration under Section 56 4(b)(1) of the Act, 21 U.S.C. section 360bbb-3(b)(1), unless the authorization is terminated or revoked sooner. Performed at [facility], [place] [address] [place], [place] [address]   Respiratory Panel by RT PCR (Flu A&B, Covid) - Nasopharyngeal Swab     Status: None   Collection Time: 08/03/19  5:46 AM   Specimen: Nasopharyngeal Swab  Result Value Ref Range Status   SARS Coronavirus 2 by RT PCR NEGATIVE NEGATIVE Final    Comment: (NOTE) SARS-CoV-2 target nucleic acids are NOT DETECTED. The SARS-CoV-2 RNA is generally detectable in upper respiratoy specimens during the acute phase of infection. The lowest concentration of SARS-CoV-2 viral copies this assay can detect is 131 copies/mL. A negative result does not preclude SARS-Cov-2 infection and should not be used as the sole basis for treatment or other patient management decisions. A negative result may occur with  improper specimen collection/handling, submission of specimen other than nasopharyngeal swab, presence of viral mutation(s) within the areas targeted by this assay, and inadequate number of viral copies (<131 copies/mL). A negative result must be combined with clinical observations, patient history, and epidemiological information. The expected result is Negative. Fact Sheet for Patients:  PinkCheek.be Fact Sheet for Healthcare Providers:  GravelBags.it This test is not yet ap proved or cleared  by the [place] FDA and  has been authorized for detection and/or diagnosis of SARS-CoV-2 by FDA under an Emergency Use Authorization (EUA). This EUA will remain  in effect (meaning this test can be used) for the duration of the COVID-19 declaration under Section 564(b)(1) of the Act, 21 U.S.C. section 360bbb-3(b)(1), unless the authorization is terminated or revoked sooner.    Influenza A by PCR NEGATIVE NEGATIVE Final   Influenza B by PCR NEGATIVE NEGATIVE Final    Comment: (NOTE) The Xpert Xpress SARS-CoV-2/FLU/RSV assay is intended as an aid in  the diagnosis of influenza from Nasopharyngeal swab specimens and  should not be used as a sole basis for treatment. Nasal  washings and  aspirates are unacceptable for Xpert Xpress SARS-CoV-2/FLU/RSV  testing. Fact Sheet for Patients: PinkCheek.be Fact Sheet for Healthcare Providers: GravelBags.it This test is not yet approved or cleared by the [place] FDA and  has been authorized for detection and/or diagnosis of SARS-CoV-2 by  FDA under an Emergency Use Authorization (EUA). This EUA will remain  in effect (meaning this test can be used) for the duration of the  Covid-19 declaration under Section 564(b)(1) of the Act, 21  U.S.C. section 360bbb-3(b)(1), unless the authorization is  terminated or revoked. Performed at [facility], [place] [address], [place], [place] [address]          Radiology Studies: CT ABDOMEN PELVIS W CONTRAST  Result Date: 08/03/2019 CLINICAL DATA:  LEFT lower quadrant pain. EXAM: CT ABDOMEN AND PELVIS WITH CONTRAST TECHNIQUE: Multidetector CT imaging of the abdomen and pelvis was performed using the standard protocol following bolus administration of intravenous contrast. CONTRAST:  149mL OMNIPAQUE IOHEXOL 300 MG/ML  SOLN COMPARISON:  CT 07/28/2019 FINDINGS: Lower chest: New airspace consolidation in the LEFT lower lobe with some air  bronchograms most consistent with pneumonia. Hepatobiliary: No focal hepatic lesion. Postcholecystectomy. No biliary dilatation. Pancreas: Pancreas is normal. No ductal dilatation. No pancreatic inflammation. Spleen: Normal spleen Adrenals/urinary tract: Adrenal glands and kidneys are normal. The ureters and bladder normal. Foley catheter within the bladder. Stomach/Bowel: Partial gastrectomy anatomy. Large volume of food stuff in the stomach. Small bowel normal. Appendix and cecum normal. Colon rectosigmoid colon normal. Vascular/Lymphatic: Abdominal aorta is normal caliber with atherosclerotic calcification. There is no retroperitoneal or periportal lymphadenopathy. No pelvic lymphadenopathy. Reproductive: Prostate small. Other: No free fluid. Musculoskeletal: No aggressive osseous lesion. IMPRESSION: 1. LEFT lower lobe pneumonia. 2. No acute findings in the abdomen pelvis. 3. Foley catheter in bladder. Electronically Signed   By: Suzy Bouchard M.D.   On: 08/03/2019 04:15        Scheduled Meds:  aspirin EC  81 mg Oral Daily   citalopram  20 mg Oral Daily   diltiazem  120 mg Oral BID   enoxaparin (LOVENOX) injection  40 mg Subcutaneous Daily   ferrous sulfate  325 mg Oral Q breakfast   gabapentin  100 mg Oral QHS   insulin aspart  0-15 Units Subcutaneous TID WC   pantoprazole (PROTONIX) IV  40 mg Intravenous Q24H   pravastatin  40 mg Oral QPM   tamsulosin  0.4 mg Oral Daily   Continuous Infusions:  ceFEPime (MAXIPIME) IV     lactated ringers 200 mL/hr at 08/03/19 0649   [START ON 08/04/2019] vancomycin     vancomycin 1,500 mg (08/03/19 0645)    LOS: 0 days   Time spent: 51min  Iverson Sees C Shylee Durrett, DO [facility]  If 7PM-7AM, please contact night-coverage [email or web address]  08/03/2019, 7:43 AM

## 2019-08-03 NOTE — ED Notes (Signed)
RN attempted report x1.  

## 2019-08-03 NOTE — Plan of Care (Signed)
  Problem: Health Behavior/Discharge Planning: Goal: Ability to manage health-related needs will improve Outcome: Progressing   

## 2019-08-03 NOTE — ED Notes (Signed)
Lunch Tray Ordered @ 1041. 

## 2019-08-03 NOTE — ED Notes (Signed)
Only one set of blood cultures gotten. RN Tamia informed

## 2019-08-03 NOTE — Progress Notes (Signed)
PT Cancellation Note  Patient Details Name: [name] MRN: [id number] DOB: 1940-11-05   Cancelled Treatment:    Reason Eval/Treat Not Completed: Patient declined, no reason specified. Pt refusing PT at this time, reporting significant fatigue, not having been able to get any sleep last night. PT attempts to initiate evaluation in bed and pt even refusing to lift legs for this therapist at this time. PT will follow up at a later time.    Zenaida Niece 08/03/2019, 8:39 AM

## 2019-08-04 LAB — CBC
HCT: 27.8 % — ABNORMAL LOW (ref 39.0–52.0)
Hemoglobin: 8.4 g/dL — ABNORMAL LOW (ref 13.0–17.0)
MCH: 25.5 pg — ABNORMAL LOW (ref 26.0–34.0)
MCHC: 30.2 g/dL (ref 30.0–36.0)
MCV: 84.5 fL (ref 80.0–100.0)
Platelets: 130 10*3/uL — ABNORMAL LOW (ref 150–400)
RBC: 3.29 MIL/uL — ABNORMAL LOW (ref 4.22–5.81)
RDW: 18.5 % — ABNORMAL HIGH (ref 11.5–15.5)
WBC: 6.5 10*3/uL (ref 4.0–10.5)
nRBC: 0 % (ref 0.0–0.2)

## 2019-08-04 LAB — BASIC METABOLIC PANEL
Anion gap: 10 (ref 5–15)
BUN: 15 mg/dL (ref 8–23)
CO2: 20 mmol/L — ABNORMAL LOW (ref 22–32)
Calcium: 8.4 mg/dL — ABNORMAL LOW (ref 8.9–10.3)
Chloride: 103 mmol/L (ref 98–111)
Creatinine, Ser: 1.32 mg/dL — ABNORMAL HIGH (ref 0.61–1.24)
GFR calc Af Amer: 59 mL/min — ABNORMAL LOW (ref >60)
GFR calc non Af Amer: 51 mL/min — ABNORMAL LOW (ref >60)
Glucose, Bld: 171 mg/dL — ABNORMAL HIGH (ref 70–99)
Potassium: 3.4 mmol/L — ABNORMAL LOW (ref 3.5–5.1)
Sodium: 133 mmol/L — ABNORMAL LOW (ref 135–145)

## 2019-08-04 LAB — MAGNESIUM: Magnesium: 1.9 mg/dL (ref 1.7–2.4)

## 2019-08-04 LAB — GLUCOSE, CAPILLARY
Glucose-Capillary: 155 mg/dL — ABNORMAL HIGH (ref 70–99)
Glucose-Capillary: 186 mg/dL — ABNORMAL HIGH (ref 70–99)
Glucose-Capillary: 158 mg/dL — ABNORMAL HIGH (ref 70–99)
Glucose-Capillary: 148 mg/dL — ABNORMAL HIGH (ref 70–99)

## 2019-08-04 MED ORDER — MAGNESIUM CITRATE PO SOLN
1.0000 | Freq: Once | ORAL | Status: AC
  Administered 2019-08-04: 1 via ORAL
  Filled 2019-08-04: qty 296

## 2019-08-04 NOTE — Progress Notes (Signed)
PROGRESS NOTE    [name]  OTR:[id number] DOB: 06/29/1940 DOA: 08/02/2019 PCP: Nicolette Bang, DO   Brief Narrative:  [name] is a 79 y.o. male with PMH of chronic normocytic anemia atrial flutter/atrial fibrillation (not on anticoagulation due to history of GI bleed), chronic diastolic heart failure, COPD, T2DM, nonhemorrhagic CVA who presented to ED with abdominal pain and found to have HCAP. Patient is a poor historian but reports epigastric and LUQ/LLQ pain for several weeks. Reports pain is sharp but does not radiate. Reports constant pain and worsening. Nothing has helped pain, worse by eating certain foods and reports he is eating "light" foods only. Reports that he is very hungry and thirsty currently. He believes he is having an episode of pancreatitis which he has had before. Reports cough and SOB for several weeks as well. Denies headache, dizziness, fever, chills, chest pain, nausea, vomiting, diarrhea, constipation, dysuria, hematuria, hematochezia, melena, difficulty moving arms/legs, speech difficulty, confusion or any other complaints. In the ED: Vitals stable on room air. Labs remarkable for WBC 7.1, Hgb 9.9, AST 48, Lipase 65, glucose 215. UA also appears infected but patient has chronic catheter in place.  Assessment & Plan:   Principal Problem:   HCAP (healthcare-associated pneumonia) Active Problems:   Essential hypertension   Chronic diastolic congestive heart failure (HCC)   Persistent atrial fibrillation (HCC)   Mixed hyperlipidemia   Type 2 diabetes mellitus (HCC)   COPD (chronic obstructive pulmonary disease) (HCC)   Major depressive disorder, recurrent severe without psychotic features ([place])   Acute intractable abdominal pain in the setting of likely constipation, POA - Continue bowel regimen, CT remarkable for large stool burden - Consider NG tube placement given large volume noted in the stomach, hold off for now given patient has no  complaints of nausea or vomiting - No BM still - increase med frequency and add mag citrate - give bowel prep kit tonight if no improvement today  Questionable HCAP pneumonia, POA - Continue cefepime, vancomycin - CT abdomen shows small what appears to be infiltrate in the left lower lobe - Patient does complain of pain in the left lower quadrant of the chest versus left upper quadrant of the abdomen unable to differentiate between distended stomach pain constipation and lower pneumonia  Generalized weakness, ambulatory dysfunction, acute on chronic - Appears to be acute on chronic, recently admitted with symptomatic anemia, ongoing ambulatory dysfunction now appears to be in the setting of acute pneumonia and constipation - Continue PT OT, we discussed possible need for physical therapy and SNF placement, patient hesitant to be discharged anywhere but home with his family  A. fib, chronic -rate controlled, not on anticoagulation - Currently rate controlled, patient has history of GI bleed, unable to tolerate anticoagulation - Continue diltiazem  Diastolic dysfunction heart failure, not in acute exacerbation - Continue home medications; appears euvolemic  Diabetes type 2, non-insulin-dependent, uncontrolled - Continue sliding scale insulin, hypoglycemic protocol - A1c previously 8.0, no indication to recheck  Hypertension, essential - Resume home medications once indicated, blood pressure currently borderline low  COPD, without chronic hypoxia, no acute exacerbation - Continue home inhaled medications - No wheeze on exam, hold off on steroids but continue antibiotics as above for presumed HCAP  DVT prophylaxis: SCDs only, history of GI bleed unable to tolerate anticoagulation Code Status: Full code Family Communication: None available  Status is: Inpatient  Dispo: The patient is from: [facility]  Anticipated d/c is to: Likely back to [facility] pending clinical  workup/evaluation              Anticipated d/c date is: 24 to 48 hours pending clinical course              Patient currently not medically stable for discharge given ongoing abdominal pain, constipation, pneumonia requiring IV antibiotics poor p.o. intake requiring IV fluids  Consultants:   None  Procedures:   None  Antimicrobials:  Cefepime, vancomycin  Subjective: No acute issues or events overnight, patient's abdominal pain is ongoing, continues to decline recent bowel movement.  Declines headache, fever, chills, nausea, vomiting, diarrhea.  Objective: Vitals:   08/03/19 1942 08/03/19 2235 08/04/19 0012 08/04/19 0455  BP: (!) 104/56 (!) 108/46 (!) 95/55 (!) 104/49  Pulse: 89  84 86  Resp: 18  18 17   Temp: 98.9 F (37.2 C)  98.2 F (36.8 C) 98.4 F (36.9 C)  TempSrc: Oral  Axillary Oral  SpO2: 99%  93% 96%  Weight:    78.5 kg  Height:        Intake/Output Summary (Last 24 hours) at 08/04/2019 0722 Last data filed at 08/04/2019 0600 Gross per 24 hour  Intake 2000 ml  Output 1280 ml  Net 720 ml   Filed Weights   08/02/19 2359 08/04/19 0455  Weight: 75.3 kg 78.5 kg    Examination:  General:  Pleasantly resting in bed, No acute distress. HEENT:  Normocephalic atraumatic.  Sclerae nonicteric, noninjected.  Extraocular movements intact bilaterally. Neck:  Without mass or deformity.  Trachea is midline. Lungs:  Clear to auscultate bilaterally without rhonchi, wheeze, or rales. Heart:  Regular rate and rhythm.  Without murmurs, rubs, or gallops. Abdomen: Diffusely tender, no PMI without rebound or guarding. Extremities: Without cyanosis, clubbing, edema, or obvious deformity. Vascular:  Dorsalis pedis and posterior tibial pulses palpable bilaterally. Skin:  Warm and dry, no erythema  Data Reviewed: I have personally reviewed following labs and imaging studies  CBC: Recent Labs  Lab 07/28/19 1634 07/29/19 0525 07/30/19 1000 08/03/19 0110 08/03/19 0113  WBC  3.2* 8.0 4.3 7.1  --   NEUTROABS 1.9  --   --  5.3  --   HGB 7.6* 9.2* 8.9* 9.9* 11.2*  HCT 24.5* 30.2* 29.2* 33.5* 33.0*  MCV 84.5 85.1 84.9 86.3  --   PLT PLATELET CLUMPS NOTED ON SMEAR, UNABLE TO ESTIMATE 122* 133* 142*  --    Basic Metabolic Panel: Recent Labs  Lab 07/28/19 1634 07/28/19 1634 07/29/19 0525 07/30/19 1143 07/31/19 0453 08/01/19 0737 08/03/19 0113  NA 137   < > 138 134* 136 135 134*  K 3.8   < > 3.3* 4.2 4.2 3.9 3.6  CL 106   < > 105 104 107 105 100  CO2 21*  --  20* 19* 20* 21*  --   GLUCOSE 192*   < > 158* 196* 147* 138* 215*  BUN 21   < > 18 20 19 13 15   CREATININE 1.38*   < > 1.33* 1.47* 1.53* 1.31* 1.10  CALCIUM 9.2  --  9.3 8.6* 8.9 8.7*  --   MG  --   --  1.7  --   --   --   --   PHOS  --   --  3.0  --   --   --   --    < > = values in this interval not displayed.   GFR: Estimated  Creatinine Clearance: 52.8 mL/min (by C-G formula based on SCr of 1.1 mg/dL). Liver Function Tests: Recent Labs  Lab 07/28/19 1634 07/29/19 0525 08/03/19 0110  AST 39 44* 48*  ALT 29 31 34  ALKPHOS 138* 133* 141*  BILITOT 0.8 1.3* 0.8  PROT 8.9* 8.8* 9.4*  ALBUMIN 2.6* 2.5* 2.6*   Recent Labs  Lab 07/28/19 1634 08/03/19 0111  LIPASE 36 65*   No results for input(s): AMMONIA in the last 168 hours. Coagulation Profile: No results for input(s): INR, PROTIME in the last 168 hours. Cardiac Enzymes: No results for input(s): CKTOTAL, CKMB, CKMBINDEX, TROPONINI in the last 168 hours. BNP (last 3 results) No results for input(s): PROBNP in the last 8760 hours. HbA1C: No results for input(s): HGBA1C in the last 72 hours. CBG: Recent Labs  Lab 08/01/19 1231 08/03/19 0749 08/03/19 1200 08/03/19 1319 08/03/19 2222  GLUCAP 287* 148* 180* 178* 113*   Lipid Profile: No results for input(s): CHOL, HDL, LDLCALC, TRIG, CHOLHDL, LDLDIRECT in the last 72 hours. Thyroid Function Tests: No results for input(s): TSH, T4TOTAL, FREET4, T3FREE, THYROIDAB in the last 72  hours. Anemia Panel: No results for input(s): VITAMINB12, FOLATE, FERRITIN, TIBC, IRON, RETICCTPCT in the last 72 hours. Sepsis Labs: No results for input(s): PROCALCITON, LATICACIDVEN in the last 168 hours.  Recent Results (from the past 240 hour(s))  Urine culture     Status: Abnormal   Collection Time: 07/28/19  4:34 PM   Specimen: Urine, Random  Result Value Ref Range Status   Specimen Description URINE, RANDOM  Final   Special Requests   Final    NONE Performed at [facility], [address], [place], [place] [address]    Culture MULTIPLE SPECIES PRESENT, SUGGEST RECOLLECTION (A)  Final   Report Status 07/29/2019 FINAL  Final  Respiratory Panel by RT PCR (Flu A&B, Covid) - Nasopharyngeal Swab     Status: None   Collection Time: 07/28/19  8:20 PM   Specimen: Nasopharyngeal Swab  Result Value Ref Range Status   SARS Coronavirus 2 by RT PCR NEGATIVE NEGATIVE Final    Comment: (NOTE) SARS-CoV-2 target nucleic acids are NOT DETECTED. The SARS-CoV-2 RNA is generally detectable in upper respiratoy specimens during the acute phase of infection. The lowest concentration of SARS-CoV-2 viral copies this assay can detect is 131 copies/mL. A negative result does not preclude SARS-Cov-2 infection and should not be used as the sole basis for treatment or other patient management decisions. A negative result may occur with  improper specimen collection/handling, submission of specimen other than nasopharyngeal swab, presence of viral mutation(s) within the areas targeted by this assay, and inadequate number of viral copies (<131 copies/mL). A negative result must be combined with clinical observations, patient history, and epidemiological information. The expected result is Negative. Fact Sheet for Patients:  PinkCheek.be Fact Sheet for Healthcare Providers:  GravelBags.it This test is not yet ap proved or cleared by the  [place] FDA and  has been authorized for detection and/or diagnosis of SARS-CoV-2 by FDA under an Emergency Use Authorization (EUA). This EUA will remain  in effect (meaning this test can be used) for the duration of the COVID-19 declaration under Section 564(b)(1) of the Act, 21 U.S.C. section 360bbb-3(b)(1), unless the authorization is terminated or revoked sooner.    Influenza A by PCR NEGATIVE NEGATIVE Final   Influenza B by PCR NEGATIVE NEGATIVE Final    Comment: (NOTE) The Xpert Xpress SARS-CoV-2/FLU/RSV assay is intended as an aid  in  the diagnosis of influenza from Nasopharyngeal swab specimens and  should not be used as a sole basis for treatment. Nasal washings and  aspirates are unacceptable for Xpert Xpress SARS-CoV-2/FLU/RSV  testing. Fact Sheet for Patients: PinkCheek.be Fact Sheet for Healthcare Providers: GravelBags.it This test is not yet approved or cleared by the [place] FDA and  has been authorized for detection and/or diagnosis of SARS-CoV-2 by  FDA under an Emergency Use Authorization (EUA). This EUA will remain  in effect (meaning this test can be used) for the duration of the  Covid-19 declaration under Section 564(b)(1) of the Act, 21  U.S.C. section 360bbb-3(b)(1), unless the authorization is  terminated or revoked. Performed at [facility], [place] [address], [place], [place] [address]   SARS CORONAVIRUS 2 (TAT 6-24 HRS) Nasopharyngeal Nasopharyngeal Swab     Status: None   Collection Time: 08/01/19 12:28 AM   Specimen: Nasopharyngeal Swab  Result Value Ref Range Status   SARS Coronavirus 2 NEGATIVE NEGATIVE Final    Comment: (NOTE) SARS-CoV-2 target nucleic acids are NOT DETECTED. The SARS-CoV-2 RNA is generally detectable in upper and lower respiratory specimens during the acute phase of infection. Negative results do not preclude SARS-CoV-2 infection, do not rule  out co-infections with other pathogens, and should not be used as the sole basis for treatment or other patient management decisions. Negative results must be combined with clinical observations, patient history, and epidemiological information. The expected result is Negative. Fact Sheet for Patients: SugarRoll.be Fact Sheet for Healthcare Providers: https://www.woods-mathews.com/ This test is not yet approved or cleared by the [place] FDA and  has been authorized for detection and/or diagnosis of SARS-CoV-2 by FDA under an Emergency Use Authorization (EUA). This EUA will remain  in effect (meaning this test can be used) for the duration of the COVID-19 declaration under Section 56 4(b)(1) of the Act, 21 U.S.C. section 360bbb-3(b)(1), unless the authorization is terminated or revoked sooner. Performed at [facility], [place] [address] [place], [place] [address]   Respiratory Panel by RT PCR (Flu A&B, Covid) - Nasopharyngeal Swab     Status: None   Collection Time: 08/03/19  5:46 AM   Specimen: Nasopharyngeal Swab  Result Value Ref Range Status   SARS Coronavirus 2 by RT PCR NEGATIVE NEGATIVE Final    Comment: (NOTE) SARS-CoV-2 target nucleic acids are NOT DETECTED. The SARS-CoV-2 RNA is generally detectable in upper respiratoy specimens during the acute phase of infection. The lowest concentration of SARS-CoV-2 viral copies this assay can detect is 131 copies/mL. A negative result does not preclude SARS-Cov-2 infection and should not be used as the sole basis for treatment or other patient management decisions. A negative result may occur with  improper specimen collection/handling, submission of specimen other than nasopharyngeal swab, presence of viral mutation(s) within the areas targeted by this assay, and inadequate number of viral copies (<131 copies/mL). A negative result must be combined with clinical observations,  patient history, and epidemiological information. The expected result is Negative. Fact Sheet for Patients:  PinkCheek.be Fact Sheet for Healthcare Providers:  GravelBags.it This test is not yet ap proved or cleared by the [place] FDA and  has been authorized for detection and/or diagnosis of SARS-CoV-2 by FDA under an Emergency Use Authorization (EUA). This EUA will remain  in effect (meaning this test can be used) for the duration of the COVID-19 declaration under Section 564(b)(1) of the Act, 21 U.S.C. section 360bbb-3(b)(1), unless the authorization  is terminated or revoked sooner.    Influenza A by PCR NEGATIVE NEGATIVE Final   Influenza B by PCR NEGATIVE NEGATIVE Final    Comment: (NOTE) The Xpert Xpress SARS-CoV-2/FLU/RSV assay is intended as an aid in  the diagnosis of influenza from Nasopharyngeal swab specimens and  should not be used as a sole basis for treatment. Nasal washings and  aspirates are unacceptable for Xpert Xpress SARS-CoV-2/FLU/RSV  testing. Fact Sheet for Patients: PinkCheek.be Fact Sheet for Healthcare Providers: GravelBags.it This test is not yet approved or cleared by the [place] FDA and  has been authorized for detection and/or diagnosis of SARS-CoV-2 by  FDA under an Emergency Use Authorization (EUA). This EUA will remain  in effect (meaning this test can be used) for the duration of the  Covid-19 declaration under Section 564(b)(1) of the Act, 21  U.S.C. section 360bbb-3(b)(1), unless the authorization is  terminated or revoked. Performed at [facility], [place] [address], [place], [place] [address]   Blood culture (routine x 2)     Status: Abnormal (Preliminary result)   Collection Time: 08/03/19  7:49 AM   Specimen: BLOOD RIGHT ARM  Result Value Ref Range Status   Specimen Description BLOOD RIGHT ARM  Final    Special Requests   Final    BOTTLES DRAWN AEROBIC AND ANAEROBIC Blood Culture adequate volume   Culture  Setup Time (A)  Final    GRAM VARIABLE ROD ANAEROBIC BOTTLE ONLY Organism ID to follow NO ORGANISM DETECTED CRITICAL RESULT CALLED TO, READ BACK BY AND VERIFIED WITH: PHARMD JAMES LEDFORD AT 2305 BY [place]ppner ON 08/03/2019 Performed at [facility], [address], [place], [place] [address]    Culture PENDING  Incomplete   Report Status PENDING  Incomplete  Blood Culture ID Panel (Reflexed)     Status: None   Collection Time: 08/03/19  7:49 AM  Result Value Ref Range Status   Enterococcus species NOT DETECTED NOT DETECTED Final   Listeria monocytogenes NOT DETECTED NOT DETECTED Final   Staphylococcus species NOT DETECTED NOT DETECTED Final   Staphylococcus aureus (BCID) NOT DETECTED NOT DETECTED Final   Streptococcus species NOT DETECTED NOT DETECTED Final   Streptococcus agalactiae NOT DETECTED NOT DETECTED Final   Streptococcus pneumoniae NOT DETECTED NOT DETECTED Final   Streptococcus pyogenes NOT DETECTED NOT DETECTED Final   Acinetobacter baumannii NOT DETECTED NOT DETECTED Final   Enterobacteriaceae species NOT DETECTED NOT DETECTED Final   Enterobacter cloacae complex NOT DETECTED NOT DETECTED Final   Escherichia coli NOT DETECTED NOT DETECTED Final   Klebsiella oxytoca NOT DETECTED NOT DETECTED Final   Klebsiella pneumoniae NOT DETECTED NOT DETECTED Final   Proteus species NOT DETECTED NOT DETECTED Final   Serratia marcescens NOT DETECTED NOT DETECTED Final   Haemophilus influenzae NOT DETECTED NOT DETECTED Final   Neisseria meningitidis NOT DETECTED NOT DETECTED Final   Pseudomonas aeruginosa NOT DETECTED NOT DETECTED Final   Candida albicans NOT DETECTED NOT DETECTED Final   Candida glabrata NOT DETECTED NOT DETECTED Final   Candida krusei NOT DETECTED NOT DETECTED Final   Candida parapsilosis NOT DETECTED NOT DETECTED Final   Candida tropicalis NOT  DETECTED NOT DETECTED Final    Comment: Performed at [facility], [place]. [address] [place], [place] [address]  MRSA PCR Screening     Status: None   Collection Time: 08/03/19  5:46 PM   Specimen: Nasopharyngeal  Result Value Ref Range Status   MRSA  by PCR NEGATIVE NEGATIVE Final    Comment:        The GeneXpert MRSA Assay (FDA approved for NASAL specimens only), is one component of a comprehensive MRSA colonization surveillance program. It is not intended to diagnose MRSA infection nor to guide or monitor treatment for MRSA infections. Performed at [facility], [place] [address] [place], [place] [address]          Radiology Studies: CT ABDOMEN PELVIS W CONTRAST  Result Date: 08/03/2019 CLINICAL DATA:  LEFT lower quadrant pain. EXAM: CT ABDOMEN AND PELVIS WITH CONTRAST TECHNIQUE: Multidetector CT imaging of the abdomen and pelvis was performed using the standard protocol following bolus administration of intravenous contrast. CONTRAST:  152m OMNIPAQUE IOHEXOL 300 MG/ML  SOLN COMPARISON:  CT 07/28/2019 FINDINGS: Lower chest: New airspace consolidation in the LEFT lower lobe with some air bronchograms most consistent with pneumonia. Hepatobiliary: No focal hepatic lesion. Postcholecystectomy. No biliary dilatation. Pancreas: Pancreas is normal. No ductal dilatation. No pancreatic inflammation. Spleen: Normal spleen Adrenals/urinary tract: Adrenal glands and kidneys are normal. The ureters and bladder normal. Foley catheter within the bladder. Stomach/Bowel: Partial gastrectomy anatomy. Large volume of food stuff in the stomach. Small bowel normal. Appendix and cecum normal. Colon rectosigmoid colon normal. Vascular/Lymphatic: Abdominal aorta is normal caliber with atherosclerotic calcification. There is no retroperitoneal or periportal lymphadenopathy. No pelvic lymphadenopathy. Reproductive: Prostate small. Other: No free fluid. Musculoskeletal: No aggressive osseous lesion.  IMPRESSION: 1. LEFT lower lobe pneumonia. 2. No acute findings in the abdomen pelvis. 3. Foley catheter in bladder. Electronically Signed   By: SSuzy BouchardM.D.   On: 08/03/2019 04:15        Scheduled Meds: . aspirin EC  81 mg Oral Daily  . Chlorhexidine Gluconate Cloth  6 each Topical Daily  . citalopram  20 mg Oral Daily  . diltiazem  120 mg Oral BID  . enoxaparin (LOVENOX) injection  40 mg Subcutaneous Daily  . ferrous sulfate  325 mg Oral Q breakfast  . gabapentin  100 mg Oral QHS  . insulin aspart  0-15 Units Subcutaneous TID WC  . pantoprazole (PROTONIX) IV  40 mg Intravenous Q24H  . polyethylene glycol  17 g Oral TID  . pravastatin  40 mg Oral QPM  . senna-docusate  1 tablet Oral BID  . tamsulosin  0.4 mg Oral Daily   Continuous Infusions: . ceFEPime (MAXIPIME) IV 2 g (08/03/19 2022)  . vancomycin 1,000 mg (08/04/19 0548)    LOS: 1 day   Time spent: 431m  Khrystyne Arpin C Bronco Mcgrory, DO [facility]  If 7PM-7AM, please contact night-coverage [email or web address]  08/04/2019, 7:22 AM

## 2019-08-04 NOTE — Progress Notes (Signed)
OT Cancellation Note  Patient Details Name: [name] MRN: [id number] DOB: 04/04/1940   Cancelled Treatment:    Reason Eval/Treat Not Completed: Patient declined, no reason specified- pt declined reporting "Im so weak, I need something to eat before I try to get up".  Pt liquid tray arrived and assisted pt with opening containers. Will follow and see as able.   Jolaine Artist, OT Acute Rehabilitation Services Pager [phone] Office [phone]    Delight Stare 08/04/2019, 8:51 AM

## 2019-08-04 NOTE — Evaluation (Signed)
Occupational Therapy Evaluation Patient Details Name: [name] MRN: [id number] DOB: 09/06/1940 Today's Date: 08/04/2019    History of Present Illness   Pt is an 79 y/o male with PMH of normocytic anemia atrial flutter/atrial fibrillation, chronic diastolic heart failure, COPD, T2DM, nonhemorrhagic CVA who presented to ED with abdominal pain and found to have HCAP.    Clinical Impression   PTA patient reports needing assist for ADLs, mobility, recently dc'd to [facility].  Admitted for above and limited by problem list below, including generalized weakness, decreased activity tolerance, impaired balance, impaired cognition, and pain (in R abdomen, jaw and neck).  Patient currently requires max assist to total assist +2 for bed mobility, mod assist +2 for sit to stand (partial stand) and min-total assist for ADLs. He requires max encouragement to participate, educated on importance of mobility and positional changes OOB but pt declines OOB.  Will follow acutely, recommend continued SNF level rehab at dc.     Follow Up Recommendations  SNF;Supervision/Assistance - 24 hour    Equipment Recommendations  Other (comment)(TBD at next venue of care)    Recommendations for Other Services       Precautions / Restrictions Precautions Precautions: Fall Restrictions Weight Bearing Restrictions: No      Mobility Bed Mobility Overal bed mobility: Needs Assistance Bed Mobility: Rolling;Sidelying to Sit;Sit to Sidelying Rolling: Mod assist Sidelying to sit: Max assist;HOB elevated     Sit to sidelying: Total assist;+2 for physical assistance;+2 for safety/equipment;HOB elevated General bed mobility comments: mod assist to roll during toileting, max assist for trunk and LB support to transition to EOB; returned to supine with total assist +2  Transfers Overall transfer level: Needs assistance   Transfers: Sit to/from Stand Sit to Stand: Mod assist;+2 physical assistance;+2  safety/equipment;From elevated surface         General transfer comment: pt holding onto the recliner to power up to stand in to partial stand with mod assist +2, limited tolerance due to pain     Balance Overall balance assessment: Needs assistance;History of Falls Sitting-balance support: No upper extremity supported;Feet supported Sitting balance-Leahy Scale: Fair Sitting balance - Comments: statically min guard    Standing balance support: Bilateral upper extremity supported;During functional activity Standing balance-Leahy Scale: Poor Standing balance comment: reliant on BUE and external support                           ADL either performed or assessed with clinical judgement   ADL Overall ADL's : Needs assistance/impaired     Grooming: Set up;Wash/dry face;Sitting   Upper Body Bathing: Sitting;Minimal assistance   Lower Body Bathing: Sit to/from stand;Total assistance;+2 for physical assistance;+2 for safety/equipment   Upper Body Dressing : Sitting;Moderate assistance   Lower Body Dressing: Total assistance;+2 for physical assistance;+2 for safety/equipment;Sit to/from Health and safety inspector Details (indicate cue type and reason): deferred  Toileting- Clothing Manipulation and Hygiene: Total assistance;+2 for physical assistance;+2 for safety/equipment;Bed level Toileting - Clothing Manipulation Details (indicate cue type and reason): +BM on bedpan      Functional mobility during ADLs: Moderate assistance;+2 for physical assistance;+2 for safety/equipment;Maximal assistance;Total assistance;Cueing for safety;Cueing for sequencing General ADL Comments: pt limited by generalized weakness, decreased activity tolerance, impaired balance and pain      Vision Baseline Vision/History: Legally blind(L eye ) Patient Visual Report: No change from baseline Vision Assessment?: No apparent visual deficits     Perception  Praxis      Pertinent  Vitals/Pain Pain Assessment: Faces Faces Pain Scale: Hurts even more Pain Location: jaw, neck, R side abdomen  Pain Descriptors / Indicators: Aching;Sore Pain Intervention(s): Monitored during session;Repositioned;Limited activity within patient's tolerance;RN gave pain meds during session     Hand Dominance Right   Extremity/Trunk Assessment Upper Extremity Assessment Upper Extremity Assessment: Generalized weakness   Lower Extremity Assessment Lower Extremity Assessment: Defer to PT evaluation       Communication Communication Communication: No difficulties   Cognition Arousal/Alertness: Awake/alert Behavior During Therapy: Flat affect Overall Cognitive Status: Impaired/Different from baseline Area of Impairment: Problem solving;Safety/judgement;Following commands;Awareness                       Following Commands: Follows one step commands consistently;Follows one step commands with increased time;Follows multi-step commands inconsistently Safety/Judgement: Decreased awareness of safety Awareness: Intellectual Problem Solving: Slow processing;Requires verbal cues;Difficulty sequencing General Comments: patient erquires cueing for safety and problem solving, mild confusion but A&O x 4   General Comments       Exercises     Shoulder Instructions      Home Living Family/patient expects to be discharged to:: Skilled nursing facility                                 Additional Comments: Yell      Prior Functioning/Environment Level of Independence: Needs assistance  Gait / Transfers Assistance Needed: reports needing assist  ADL's / Homemaking Assistance Needed: reports assist for ADLs, toileting using bed pan recently             OT Problem List: Decreased strength;Decreased activity tolerance;Impaired balance (sitting and/or standing);Decreased safety awareness;Pain;Increased edema;Decreased knowledge of use of DME or AE;Decreased  cognition      OT Treatment/Interventions: Self-care/ADL training;Therapeutic exercise;Energy conservation;DME and/or AE instruction;Therapeutic activities;Patient/family education;Balance training;Cognitive remediation/compensation    OT Goals(Current goals can be found in the care plan section) Acute Rehab OT Goals Patient Stated Goal: to get back to rehab and less pain  OT Goal Formulation: With patient Time For Goal Achievement: 08/18/19 Potential to Achieve Goals: Good  OT Frequency: Min 2X/week   Barriers to D/C:            Co-evaluation              AM-PAC OT "6 Clicks" Daily Activity     Outcome Measure Help from another person eating meals?: None Help from another person taking care of personal grooming?: A Little Help from another person toileting, which includes using toliet, bedpan, or urinal?: Total Help from another person bathing (including washing, rinsing, drying)?: A Lot Help from another person to put on and taking off regular upper body clothing?: A Lot Help from another person to put on and taking off regular lower body clothing?: Total 6 Click Score: 13   End of Session Nurse Communication: Mobility status  Activity Tolerance: Patient limited by pain Patient left: in bed;with call bell/phone within reach;with bed alarm set;with nursing/sitter in room  OT Visit Diagnosis: Unsteadiness on feet (R26.81);Muscle weakness (generalized) (M62.81);Pain Pain - part of body: (jaw, neck, and R side abdomen)                Time: [id number] OT Time Calculation (min): 33 min Charges:  OT General Charges $OT Visit: 1 Visit OT Evaluation $OT Eval Moderate Complexity: 1 Mod  Laira Penninger B, OT  Acute Rehabilitation Services Pager [phone] Office [place] 08/04/2019, 1:00 PM

## 2019-08-04 NOTE — Evaluation (Signed)
Physical Therapy Evaluation Patient Details Name: [name] MRN: [id number] DOB: 10/07/1940 Today's Date: 08/04/2019   History of Present Illness    Pt is an 79 y/o male with PMH of normocytic anemia atrial flutter/atrial fibrillation, chronic diastolic heart failure, COPD, T2DM, nonhemorrhagic CVA who presented to ED with abdominal pain and found to have HCAP.   Clinical Impression  Pt admitted with above diagnosis. Pt was adamant to get pain meds before working with PT/OT.  Called nurse who brought meds. Only able to get pt to come to EOB and to partially stand with mod assist of 2. Pt insisting to lay back down.  Will follow acutely and progress as able.  Pt currently with functional limitations due to the deficits listed below (see PT Problem List). Pt will benefit from skilled PT to increase their independence and safety with mobility to allow discharge to the venue listed below.      Follow Up Recommendations SNF;Supervision/Assistance - 24 hour    Equipment Recommendations  None recommended by PT    Recommendations for Other Services       Precautions / Restrictions Precautions Precautions: Fall Restrictions Weight Bearing Restrictions: No      Mobility  Bed Mobility Overal bed mobility: Needs Assistance Bed Mobility: Rolling;Sidelying to Sit;Sit to Sidelying Rolling: Mod assist Sidelying to sit: Max assist;HOB elevated     Sit to sidelying: Total assist;+2 for physical assistance;+2 for safety/equipment;HOB elevated General bed mobility comments: mod assist to roll during toileting, max assist for trunk and LB support to transition to EOB; returned to supine with total assist +2  Transfers Overall transfer level: Needs assistance   Transfers: Sit to/from Stand Sit to Stand: Mod assist;+2 physical assistance;+2 safety/equipment;From elevated surface         General transfer comment: pt holding onto the recliner to power up to stand in to partial stand with  mod assist +2, limited tolerance due to pain   Ambulation/Gait                Stairs            Wheelchair Mobility    Modified Rankin (Stroke Patients Only)       Balance Overall balance assessment: Needs assistance;History of Falls Sitting-balance support: No upper extremity supported;Feet supported Sitting balance-Leahy Scale: Fair Sitting balance - Comments: statically min guard    Standing balance support: Bilateral upper extremity supported;During functional activity Standing balance-Leahy Scale: Poor Standing balance comment: reliant on BUE and external support                             Pertinent Vitals/Pain Pain Assessment: Faces Faces Pain Scale: Hurts even more Pain Location: jaw, neck, R side abdomen  Pain Descriptors / Indicators: Aching;Sore Pain Intervention(s): Limited activity within patient's tolerance;Monitored during session;Repositioned;Patient requesting pain meds-RN notified;RN gave pain meds during session    [place] expects to be discharged to:: Skilled nursing facility Living Arrangements: Children Available Help at Discharge: Family;Available PRN/intermittently Type of Home: Mobile home Home Access: Stairs to enter Entrance Stairs-Rails: Right;Left Entrance Stairs-Number of Steps: 4 Home Layout: One level Home Equipment: Walker - 4 wheels;Cane - single point;Shower seat;Grab bars - tub/shower;Grab bars - toilet Additional Comments: Health and safety inspector    Prior Function Level of Independence: Needs assistance   Gait / Transfers Assistance Needed: reports needing assist   ADL's / Homemaking Assistance Needed: reports assist for ADLs, toileting using bed pan recently  Hand Dominance   Dominant Hand: Right    Extremity/Trunk Assessment   Upper Extremity Assessment Upper Extremity Assessment: Defer to OT evaluation    Lower Extremity Assessment Lower Extremity Assessment: Generalized  weakness    Cervical / Trunk Assessment Cervical / Trunk Assessment: Normal  Communication   Communication: No difficulties  Cognition Arousal/Alertness: Awake/alert Behavior During Therapy: Flat affect Overall Cognitive Status: Impaired/Different from baseline Area of Impairment: Problem solving;Safety/judgement;Following commands;Awareness                       Following Commands: Follows one step commands consistently;Follows one step commands with increased time;Follows multi-step commands inconsistently Safety/Judgement: Decreased awareness of safety Awareness: Intellectual Problem Solving: Slow processing;Requires verbal cues;Difficulty sequencing General Comments: patient requires cueing for safety and problem solving, mild confusion but A&O x 4      General Comments General comments (skin integrity, edema, etc.): Pt on no O2, VSS    Exercises General Exercises - Lower Extremity Long Arc Quad: AROM;Both;5 reps;Seated   Assessment/Plan    PT Assessment Patient needs continued PT services  PT Problem List Decreased strength;Decreased mobility;Decreased safety awareness;Decreased activity tolerance;Decreased balance;Decreased knowledge of use of DME;Pain       PT Treatment Interventions DME instruction;Therapeutic activities;Gait training;Therapeutic exercise;Patient/family education;Balance training;Functional mobility training;Neuromuscular re-education    PT Goals (Current goals can be found in the Care Plan section)  Acute Rehab PT Goals Patient Stated Goal: to get back to rehab and less pain  PT Goal Formulation: With patient Time For Goal Achievement: 08/18/19 Potential to Achieve Goals: Fair    Frequency Min 2X/week   Barriers to discharge Decreased caregiver support      Co-evaluation PT/OT/SLP Co-Evaluation/Treatment: Yes Reason for Co-Treatment: Complexity of the patient's impairments (multi-system involvement);For patient/therapist safety PT  goals addressed during session: Mobility/safety with mobility         AM-PAC PT "6 Clicks" Mobility  Outcome Measure Help needed turning from your back to your side while in a flat bed without using bedrails?: A Lot Help needed moving from lying on your back to sitting on the side of a flat bed without using bedrails?: Total Help needed moving to and from a bed to a chair (including a wheelchair)?: Total Help needed standing up from a chair using your arms (e.g., wheelchair or bedside chair)?: Total Help needed to walk in hospital room?: Total Help needed climbing 3-5 steps with a railing? : Total 6 Click Score: 7    End of Session Equipment Utilized During Treatment: Gait belt Activity Tolerance: Patient limited by fatigue;Patient limited by pain(self limiting) Patient left: with call bell/phone within reach;in bed;with bed alarm set Nurse Communication: Mobility status PT Visit Diagnosis: Other abnormalities of gait and mobility (R26.89);Muscle weakness (generalized) (M62.81);Repeated falls (R29.6);Unsteadiness on feet (R26.81);Pain Pain - part of body: (jaw and neck)    Time: [id number] PT Time Calculation (min) (ACUTE ONLY): 20 min   Charges:   PT Evaluation $PT Eval Moderate Complexity: 1 Mod          Charisa Twitty W,PT Acute Rehabilitation Services Pager:  [phone]  Office:  [phone]    Denice Paradise 08/04/2019, 3:42 PM

## 2019-08-05 ENCOUNTER — Non-Acute Institutional Stay (SKILLED_NURSING_FACILITY): Payer: Medicare Other | Admitting: Internal Medicine

## 2019-08-05 ENCOUNTER — Encounter: Payer: Self-pay | Admitting: Internal Medicine

## 2019-08-05 DIAGNOSIS — E782 Mixed hyperlipidemia: Secondary | ICD-10-CM | POA: Diagnosis not present

## 2019-08-05 DIAGNOSIS — D500 Iron deficiency anemia secondary to blood loss (chronic): Secondary | ICD-10-CM | POA: Diagnosis not present

## 2019-08-05 DIAGNOSIS — J189 Pneumonia, unspecified organism: Secondary | ICD-10-CM

## 2019-08-05 DIAGNOSIS — I4891 Unspecified atrial fibrillation: Secondary | ICD-10-CM

## 2019-08-05 DIAGNOSIS — J449 Chronic obstructive pulmonary disease, unspecified: Secondary | ICD-10-CM | POA: Diagnosis not present

## 2019-08-05 DIAGNOSIS — I5032 Chronic diastolic (congestive) heart failure: Secondary | ICD-10-CM | POA: Diagnosis not present

## 2019-08-05 LAB — URINE CULTURE
Culture: >=100000 — AB
Special Requests: NORMAL

## 2019-08-05 LAB — BASIC METABOLIC PANEL
Anion gap: 9 (ref 5–15)
BUN: 14 mg/dL (ref 8–23)
CO2: 22 mmol/L (ref 22–32)
Calcium: 8.7 mg/dL — ABNORMAL LOW (ref 8.9–10.3)
Chloride: 104 mmol/L (ref 98–111)
Creatinine, Ser: 1.22 mg/dL (ref 0.61–1.24)
GFR calc Af Amer: >60 mL/min (ref >60)
GFR calc non Af Amer: 56 mL/min — ABNORMAL LOW (ref >60)
Glucose, Bld: 151 mg/dL — ABNORMAL HIGH (ref 70–99)
Potassium: 3.7 mmol/L (ref 3.5–5.1)
Sodium: 135 mmol/L (ref 135–145)

## 2019-08-05 LAB — CBC
HCT: 29.4 % — ABNORMAL LOW (ref 39.0–52.0)
Hemoglobin: 8.9 g/dL — ABNORMAL LOW (ref 13.0–17.0)
MCH: 25.5 pg — ABNORMAL LOW (ref 26.0–34.0)
MCHC: 30.3 g/dL (ref 30.0–36.0)
MCV: 84.2 fL (ref 80.0–100.0)
Platelets: 136 10*3/uL — ABNORMAL LOW (ref 150–400)
RBC: 3.49 MIL/uL — ABNORMAL LOW (ref 4.22–5.81)
RDW: 18 % — ABNORMAL HIGH (ref 11.5–15.5)
WBC: 4.2 10*3/uL (ref 4.0–10.5)
nRBC: 0 % (ref 0.0–0.2)

## 2019-08-05 LAB — CULTURE, BLOOD (ROUTINE X 2): Special Requests: ADEQUATE

## 2019-08-05 LAB — GLUCOSE, CAPILLARY
Glucose-Capillary: 153 mg/dL — ABNORMAL HIGH (ref 70–99)
Glucose-Capillary: 138 mg/dL — ABNORMAL HIGH (ref 70–99)

## 2019-08-05 LAB — MAGNESIUM: Magnesium: 2.4 mg/dL (ref 1.7–2.4)

## 2019-08-05 MED ORDER — SENNOSIDES-DOCUSATE SODIUM 8.6-50 MG PO TABS: 1.0000 | ORAL_TABLET | Freq: Two times a day (BID) | ORAL | 0 refills | Status: DC | PRN

## 2019-08-05 MED ORDER — CEPHALEXIN 250 MG PO CAPS: 250.0000 mg | ORAL_CAPSULE | Freq: Four times a day (QID) | ORAL | 0 refills | Status: AC

## 2019-08-05 NOTE — NC FL2 (Signed)
[place] MEDICAID FL2 LEVEL OF CARE SCREENING TOOL     IDENTIFICATION  Patient Name: [name] Birthdate: Apr 05, 1940 Sex: male Admission Date (Current Location): 08/02/2019  [facility] [place] and [place] Number:  Herbalist and Address:  [facility], [place] [address] [place], [place] [address]      Provider Number:    Attending Physician Name and Address:  Little Ishikawa, MD  Relative Name and Phone Number:  [name]   [phone]    Current Level of Care: Hospital Recommended Level of Care: [place] Prior Approval Number:    Date Approved/Denied: 07/30/19 PASRR Number: [id number]  Discharge Plan: SNF    Current Diagnoses: Patient Active Problem List   Diagnosis Date Noted  . HCAP (healthcare-associated pneumonia) 08/03/2019  . Cardiac pacemaker in situ 10/29/2018  . Major depressive disorder, recurrent severe without psychotic features ([place]) 04/15/2018  . Atrial fibrillation with RVR ([place]) 11/08/2017  . HTN (hypertension) 05/21/2017  . Diabetes mellitus, type II ([place]) 05/21/2017  . Diastolic CHF, chronic ([place]) 05/21/2017  . COPD (chronic obstructive pulmonary disease) ([place]) 05/21/2017  . H/O atrial flutter 05/21/2017  . Heme positive stool   . Symptomatic anemia 07/10/2016  . Cardiac syncope 05/15/2015  . Cervical disc disease 05/15/2015  . Anemia 05/15/2015  . History of CVA (cerebrovascular accident) 04/03/2014  . Benign neoplasm of rectum and anal canal 12/02/2013  . Tobacco use disorder 11/30/2013  . Mixed hyperlipidemia 06/06/2013  . Type 2 diabetes mellitus ([place]) 01/22/2013  . Obesity (BMI 30-39.9) 12/14/2012  . Persistent atrial fibrillation ([place]) 10/07/2010  . Atrial flutter - status post CTI ablation 07/29/2010  . Essential hypertension 07/29/2010  . Chronic diastolic congestive heart failure ([place]) 07/29/2010  . Atherosclerotic heart disease of native coronary artery with other forms of angina  pectoris ([place]) 07/29/2010    Orientation RESPIRATION BLADDER Height & Weight     Self, Time, Place  Normal Incontinent(Urinary Catheter) Weight: 174 lb 9.7 oz (79.2 kg) Height:  5\' 6"  (167.6 cm)  BEHAVIORAL SYMPTOMS/MOOD NEUROLOGICAL BOWEL NUTRITION STATUS      Continent Diet(See Discharge Summary)  AMBULATORY STATUS COMMUNICATION OF NEEDS Skin   Extensive Assist Verbally Skin abrasions, Other (Comment)(Approp. for ethnicity, dry, Abrasion cervical leg right non-tenting)                       Personal Care Assistance Level of Assistance  Bathing, Feeding, Dressing Bathing Assistance: Maximum assistance Feeding assistance: Independent(able to feed self) Dressing Assistance: Maximum assistance Total Care Assistance: Maximum assistance   Functional Limitations Info  Sight, Hearing, Speech Sight Info: Impaired Hearing Info: Adequate Speech Info: Adequate    SPECIAL CARE FACTORS FREQUENCY  PT (By licensed PT), OT (By licensed OT)     PT Frequency: 5x min weekly OT Frequency: 5x min weekly            Contractures Contractures Info: Not present    Additional Factors Info  Code Status Code Status Info: FULL Allergies Info: Cyclobenzaprine,Steroids speeds his heart rate           Current Medications (08/05/2019):  This is the current hospital active medication list Current Facility-Administered Medications  Medication Dose Route Frequency Provider Last Rate Last Admin  . acetaminophen (TYLENOL) tablet 650 mg  650 mg Oral Q6H PRN Chauncey Mann, MD   650 mg at 08/04/19 2154  . aspirin EC tablet 81 mg  81 mg Oral Daily Fair, Marin Shutter, MD  81 mg at 08/05/19 0830  . ceFEPIme (MAXIPIME) 2 g in sodium chloride 0.9 % 100 mL IVPB  2 g Intravenous BID Chauncey Mann, MD 200 mL/hr at 08/05/19 0827 2 g at 08/05/19 0827  . Chlorhexidine Gluconate Cloth 2 % PADS 6 each  6 each Topical Daily Little Ishikawa, MD   6 each at 08/04/19 1425  . citalopram (CELEXA) tablet 20  mg  20 mg Oral Daily Fair, Marin Shutter, MD   20 mg at 08/05/19 0830  . diltiazem (CARDIZEM CD) 24 hr capsule 120 mg  120 mg Oral BID Fair, Marin Shutter, MD   120 mg at 08/05/19 0830  . enoxaparin (LOVENOX) injection 40 mg  40 mg Subcutaneous Daily Fair, Marin Shutter, MD   40 mg at 08/05/19 0828  . ferrous sulfate tablet 325 mg  325 mg Oral Q breakfast Chauncey Mann, MD   325 mg at 08/05/19 0831  . gabapentin (NEURONTIN) capsule 100 mg  100 mg Oral QHS Fair, Marin Shutter, MD   100 mg at 08/04/19 2155  . insulin aspart (novoLOG) injection 0-15 Units  0-15 Units Subcutaneous TID WC Chauncey Mann, MD   3 Units at 08/05/19 [phone]  . metoCLOPramide (REGLAN) injection 5 mg  5 mg Intravenous Q6H PRN Fair, Chelsea N, MD      . nitroGLYCERIN (NITROSTAT) SL tablet 0.4 mg  0.4 mg Sublingual Q5 Min x 3 PRN Fair, Chelsea N, MD      . pantoprazole (PROTONIX) injection 40 mg  40 mg Intravenous Q24H Chauncey Mann, MD   40 mg at 08/05/19 0611  . polyethylene glycol (MIRALAX / GLYCOLAX) packet 17 g  17 g Oral TID Little Ishikawa, MD   Stopped at 08/05/19 1000  . pravastatin (PRAVACHOL) tablet 40 mg  40 mg Oral QPM Fair, Marin Shutter, MD   40 mg at 08/04/19 1706  . senna-docusate (Senokot-S) tablet 1 tablet  1 tablet Oral BID Little Ishikawa, MD   1 tablet at 08/04/19 [phone]  . tamsulosin (FLOMAX) capsule 0.4 mg  0.4 mg Oral Daily Fair, Marin Shutter, MD   0.4 mg at 08/05/19 0830     Discharge Medications: Please see discharge summary for a list of discharge medications.  Relevant Imaging Results:  Relevant Lab Results:   Additional Information SSN  Trula Ore, LCSWA

## 2019-08-05 NOTE — Progress Notes (Signed)
Provider:  Rexene Edison. Mariea Clonts, D.O., C.M.D. Location:  [facility] Room Number: [place] of Service:  SNF (31)  PCP: Nicolette Bang, DO Patient Care Team: Nicolette Bang, DO as PCP - General (Family Medicine) Leonie Man, MD as PCP - Cardiology (Cardiology) Constance Haw, MD as PCP - Electrophysiology (Cardiology)  Extended Emergency Contact Information Primary Emergency Contact: Foust,[name] Address: [address]          [place], [place] [address] Johnnette Litter of [place] Mobile Phone: [phone] Relation: Daughter  Code Status: sounds like he wants to be DNR, but appears he needs his daughter to step in and help with decision   Goals of Care: Advanced Directive information Advanced Directives 08/05/2019  Does Patient Have a Medical Advance Directive? -  Type of Advance Directive -  Does patient want to make changes to medical advance directive? No - Patient declined  Copy of [place] in Chart? -  Would patient like information on creating a medical advance directive? -  Pre-existing out of facility DNR order (yellow form or pink MOST form) -      Chief Complaint  Patient presents with  . New Admit To SNF    New Admit to SNF    HPI: Patient is a 79 y.o. male seen today for admission to [facility] for rehab s/p several recent hospitalizations.  He had been living at home with his daughter who is apparently ill herself at present.  He has a h/o DMII with neuropathy, anemia, CAD, COPD, GERD, HTN, frailty, hyperlipidemia, left eye blindness, paroxysmal afib s/p ablation (not on anticoagulation due to GI bleeding and chronic anemia), CVA, PUD, and urinary retention with catheter in place.  He ambulates with a walker with one person assist.  He is continent of bowel.  Looking back, he's had 3 trips to the hospital lately: 5/1 with catheter leaking (ER) 5/3-7 with weakness, falls,  SOB, DOE, bilateral LE edema.  His hgb was 7 and he was FOBT positive.  He was transfused 1 unit of PRBCs, given IVFs, potassium and mag repletion.  He was started on iron and PPI.   5/8-5/11 to hospital with sharp abdominal pain just after arrival here--LUQ and LLQ--he was diagnosed with HCAP LLL and constipation.  He has finished three days of keflex thru 5/14.  He is here getting PT, OT, ST, foley care, and taking glucerna daily.  He's on a heart healthy, carb steady diet w/o added sodium.  His blood pressures have been running low and diltiazem has been held some due to parameters.  He has no other bp meds to stop for hypotension.    When seen, he was feeling quite a bit better--he was eating a meal of bbq.  He admitted that he still gets some left-sided pain.  He does not currently have an incentive spirometer in his room.    He is eager to go back home with his daughter, but it sounds like that may not be in option due to her own treatments.  Past Medical History:  Diagnosis Date  . Allergic rhinitis   . Anemia    takes Ferrous Sulfate daily  . Arthritis    "all over"  . Atrial flutter ([place])   . Balance problem 01/2014  . CAP (community acquired pneumonia) 09/18/2014  . Cervical radiculopathy due to degenerative joint disease of spine   . Chronic coronary artery disease  a. 03/2010 Nonocclusive disease by cath, performed for ST elevations on ECG;  b. 06/2013 Lexi MV: EF 60%, no ischemia.  [name] COPD (chronic obstructive pulmonary disease) ([place])   . Diabetes mellitus type II    takes Metformin and Lantus daily  . Diastolic CHF, chronic ([place])    a. 12/2012 EF 55-60%, diast dysfxn, triv MR, mildly dil LA/RA.  [name] Dyspnea    with pain  . Essential hypertension    takes Diltiazem daily  . Frail elderly   . GERD (gastroesophageal reflux disease)   . History of blood transfusion 1982   "when I had stomach OR"  . History of bronchitis    1998  . History of CVA (cerebrovascular accident)     . History of gastric ulcer   . History of kidney stones   . Hypercholesterolemia    takes Pravastatin daily  . Intrinsic eczema   . Legally blind in left eye, as defined in [place]   . Obesity   . PAF (paroxysmal atrial fibrillation) (HCC)    Recurrent after atrial flutter (a. 07/2010 Status post caval tricuspid isthmus ablation by Dr. Midge Aver Metoprolol daily), currently controlled on flecainide plus diltiazem and Coumadin  . Urinary retention   . Vitamin D deficiency   . Weakness    numbness and tingling both hands   Past Surgical History:  Procedure Laterality Date  . ATRIAL ABLATION SURGERY  08/05/10   CTI ablation for atrial flutter by JA  . CARDIAC CATHETERIZATION  2012   nl LV function, no occlusive CAD, PAF  . CARDIOVERSION  12/07/2010    Successful direct current cardioversion with atrial fibrillation to normal sinus rhythm  . CARPAL TUNNEL RELEASE Bilateral 01/30/2014   Procedure: BILATERAL CARPAL TUNNEL RELEASE;  Surgeon: Marianna Payment, MD;  Location: [facility];  Service: Orthopedics;  Laterality: Bilateral;  . CATARACT EXTRACTION W/ INTRAOCULAR LENS  IMPLANT, BILATERAL Bilateral   . COLONOSCOPY [name] 12/02/2013   Procedure: COLONOSCOPY;  Surgeon: Irene Shipper, MD;  Location: [facility];  Service: Endoscopy;  Laterality: [name];  . ESOPHAGOGASTRODUODENOSCOPY [name] 09/22/2014   Procedure: ESOPHAGOGASTRODUODENOSCOPY (EGD);  Surgeon: Ronald Lobo, MD;  Location: [facility] ENDOSCOPY;  Service: Endoscopy;  Laterality: [name];  . ESOPHAGOGASTRODUODENOSCOPY [name] 07/11/2016   Procedure: ESOPHAGOGASTRODUODENOSCOPY (EGD);  Surgeon: Doran Stabler, MD;  Location: [facility] ENDOSCOPY;  Service: Endoscopy;  Laterality: [name];  . EXTRACORPOREAL SHOCK WAVE LITHOTRIPSY Right 07/15/2018   Procedure: EXTRACORPOREAL SHOCK WAVE LITHOTRIPSY (ESWL);  Surgeon: Lucas Mallow, MD;  Location: [facility];  Service: Urology;  Laterality: Right;  . INCISION AND DRAINAGE ABSCESS / HEMATOMA OF BURSA / KNEE / THIGH Left 1998    knee  . KNEE BURSECTOMY Left 1998  . LAPAROSCOPIC CHOLECYSTECTOMY  03/2010  . LEFT HEART CATH AND CORONARY ANGIOGRAPHY [name] 10/05/2017   Procedure: LEFT HEART CATH AND CORONARY ANGIOGRAPHY;  Surgeon: Martinique, Peter M, MD;  Location: [facility] CV LAB;  Service: Cardiovascular;  Laterality: [name];  . NM MYOVIEW LTD  07/22/2013   Normal EF ~60%, no ischemia or infarction.  [name] PACEMAKER IMPLANT [name] 10/21/2018   Procedure: PACEMAKER IMPLANT;  Surgeon: Deboraha Sprang, MD;  Location: [facility] CV LAB;  Service: Cardiovascular;  Laterality: [name];  . PARTIAL GASTRECTOMY  1982   subtotal; "took out 30% for ulcers"  . TRANSTHORACIC ECHOCARDIOGRAM  02/16/2014   EF 60%, no RWMA. - otherwise normal  . YAG LASER APPLICATION Bilateral     Social History   Socioeconomic History  .  Marital status: Widowed    Spouse name: Not on file  . Number of children: Not on file  . Years of education: Not on file  . Highest education level: Not on file  Occupational History  . Occupation: Retired  Tobacco Use  . Smoking status: Former Smoker    Packs/day: 0.25    Years: 57.00    Pack years: 14.25    Types: Cigarettes  . Smokeless tobacco: Never Used  . Tobacco comment: He's been smoking between 0.5 and 2 ppd since age 11. QUIT 3 MONTHS AGO  Substance and Sexual Activity  . Alcohol use: No    Alcohol/week: 0.0 standard drinks    Comment: "quit drinking in 1986"  . Drug use: No  . Sexual activity: Not Currently  Other Topics Concern  . Not on file  Social History Narrative   He is a widower, who moved to [place] in 2011 to live closer to his daughter. He formerly lived in [place]. He is a father of 21, grandfather, and great-grandfather of 63. Does not drink alcohol, context of smoker.   Social Determinants of Health   Financial Resource Strain:   . Difficulty of Paying Living Expenses:   Food Insecurity:   . Worried About Charity fundraiser in the Last Year:   . Arboriculturist in the Last  Year:   Transportation Needs:   . Film/video editor (Medical):   [name] Lack of Transportation (Non-Medical):   Physical Activity:   . Days of Exercise per Week:   . Minutes of Exercise per Session:   Stress:   . Feeling of Stress :   Social Connections:   . Frequency of Communication with Friends and Family:   . Frequency of Social Gatherings with Friends and Family:   . Attends Religious Services:   . Active Member of Clubs or Organizations:   . Attends Archivist Meetings:   [name] Marital Status:     reports that he has quit smoking. His smoking use included cigarettes. He has a 14.25 pack-year smoking history. He has never used smokeless tobacco. He reports that he does not drink alcohol or use drugs.  Functional Status Survey:    Family History  Problem Relation Age of Onset  . Breast cancer Mother   . Diabetes Mother   . Kidney disease Mother   . Heart attack Father   . Heart disease Father   . Heart attack Brother   . Leukemia Daughter     Health Maintenance  Topic Date Due  . COVID-19 Vaccine (1) Never done  . TETANUS/TDAP  Never done  . OPHTHALMOLOGY EXAM  02/03/2017  . FOOT EXAM  05/21/2018  . URINE MICROALBUMIN  10/02/2019  . INFLUENZA VACCINE  10/26/2019  . HEMOGLOBIN A1C  01/29/2020  . PNA vac Low Risk Adult  Completed    Allergies  Allergen Reactions  . Cyclobenzaprine Other (See Comments)    Unsteady gait  . Other Other (See Comments)    Steroids speeds his heart rate    Outpatient Encounter Medications as of 08/05/2019  Medication Sig  . Accu-Chek FastClix Lancets MISC USE TO TEST BLOOD SUGAR TWICE DAILY. E11.00  . aspirin EC 81 MG tablet Take 81 mg by mouth daily.  . [EXPIRED] cephALEXin (KEFLEX) 250 MG capsule Take 1 capsule (250 mg total) by mouth 4 (four) times daily for 3 days.  . citalopram (CELEXA) 20 MG tablet Take 1 tablet (20 mg total) by  mouth daily.  [name] diltiazem (CARDIZEM CD) 120 MG 24 hr capsule Take 1 capsule (120 mg total)  by mouth 2 (two) times daily.  . ferrous sulfate 325 (65 FE) MG tablet Take 325 mg by mouth daily with breakfast.  . gabapentin (NEURONTIN) 100 MG capsule Take 1 capsule (100 mg total) by mouth at bedtime.  [name] glimepiride (AMARYL) 2 MG tablet Take 0.5 tablets (1 mg total) by mouth daily before breakfast.  . glucose blood (ACCU-CHEK GUIDE) test strip USE TO TEST BLOOD SUGAR TWICE DAILY. E11.00  . nitroGLYCERIN (NITROSTAT) 0.4 MG SL tablet Place 1 tablet (0.4 mg total) under the tongue every 5 (five) minutes x 3 doses as needed for chest pain.  . pantoprazole (PROTONIX) 40 MG tablet Take 1 tablet (40 mg total) by mouth 2 (two) times daily before a meal. Dx: K21.9  . pravastatin (PRAVACHOL) 40 MG tablet Take 1 tablet (40 mg total) by mouth every evening.  . senna-docusate (SENOKOT-S) 8.6-50 MG tablet Take 1 tablet by mouth 2 (two) times daily as needed for mild constipation or moderate constipation.  . tamsulosin (FLOMAX) 0.4 MG CAPS capsule Take 1 capsule (0.4 mg total) by mouth daily.  . [DISCONTINUED] acetaminophen (TYLENOL) tablet 650 mg   . [DISCONTINUED] aspirin EC tablet 81 mg   . [DISCONTINUED] ceFEPIme (MAXIPIME) 2 g in sodium chloride 0.9 % 100 mL IVPB   . [DISCONTINUED] Chlorhexidine Gluconate Cloth 2 % PADS 6 each   . [DISCONTINUED] citalopram (CELEXA) tablet 20 mg   . [DISCONTINUED] diltiazem (CARDIZEM CD) 24 hr capsule 120 mg   . [DISCONTINUED] enoxaparin (LOVENOX) injection 40 mg   . [DISCONTINUED] ferrous sulfate tablet 325 mg   . [DISCONTINUED] gabapentin (NEURONTIN) capsule 100 mg   . [DISCONTINUED] insulin aspart (novoLOG) injection 0-15 Units   . [DISCONTINUED] metoCLOPramide (REGLAN) injection 5 mg   . [DISCONTINUED] nitroGLYCERIN (NITROSTAT) SL tablet 0.4 mg   . [DISCONTINUED] pantoprazole (PROTONIX) injection 40 mg   . [DISCONTINUED] polyethylene glycol (MIRALAX / GLYCOLAX) packet 17 g   . [DISCONTINUED] pravastatin (PRAVACHOL) tablet 40 mg   . [DISCONTINUED]  senna-docusate (Senokot-S) tablet 1 tablet   . [DISCONTINUED] tamsulosin (FLOMAX) capsule 0.4 mg    No facility-administered encounter medications on file as of 08/05/2019.    Review of Systems  Constitutional: Negative for chills and fever.  HENT: Negative for congestion.   Eyes:       Left eye blindness  Respiratory: Positive for cough. Negative for shortness of breath.   Cardiovascular: Negative for chest pain, palpitations and leg swelling.  Gastrointestinal: Positive for abdominal pain. Negative for blood in stool, constipation, diarrhea, melena, nausea and vomiting.       Bowels moving well now  Genitourinary: Negative for dysuria.       Foley in place with dark yellow urine  Musculoskeletal: Negative for falls and joint pain.  Skin: Negative for itching and rash.  Neurological: Positive for sensory change. Negative for dizziness and loss of consciousness.  Psychiatric/Behavioral: Positive for memory loss. Negative for depression. The patient is not nervous/anxious and does not have insomnia.     Vitals:   08/05/19 1559  BP: (!) 122/49  Pulse: 95  Temp: 98.5 F (36.9 C)  SpO2: 95%  Weight: 174 lb 9.6 oz (79.2 kg)  Height: 5\' 6"  (1.676 m)   Body mass index is 28.18 kg/m. Physical Exam Constitutional:      General: He is not in acute distress.    Appearance: He is not ill-appearing  or toxic-appearing.  HENT:     Head: Normocephalic and atraumatic.     Right Ear: External ear normal.     Left Ear: External ear normal.     Nose: Nose normal.     Mouth/Throat:     Pharynx: Oropharynx is clear.  Eyes:     Pupils: Pupils are equal, round, and reactive to light.  Cardiovascular:     Rate and Rhythm: Normal rate. Rhythm irregular.     Pulses: Normal pulses.     Heart sounds: Normal heart sounds.  Pulmonary:     Effort: Pulmonary effort is normal. No respiratory distress.     Breath sounds: Normal breath sounds. No wheezing, rhonchi or rales.  Abdominal:      General: Bowel sounds are normal.     Palpations: Abdomen is soft.     Tenderness: There is abdominal tenderness.     Comments: Left upper and lower abdomen  Musculoskeletal:        General: Normal range of motion.     Cervical back: Neck supple.     Right lower leg: No edema.     Left lower leg: No edema.  Skin:    General: Skin is warm and dry.  Neurological:     General: No focal deficit present.     Mental Status: He is alert and oriented to person, place, and time.     Cranial Nerves: No cranial nerve deficit.     Sensory: Sensory deficit present.     Motor: No weakness.     Gait: Gait abnormal.     Comments: Uses walker  Psychiatric:        Mood and Affect: Mood normal.     Comments: Not giving consistent answers when asked about code status--says he does not want to be kept alive on machines one minute and then he wants [place] to do everything the next     Labs reviewed: Basic Metabolic Panel: Recent Labs    10/20/18 0731 10/22/18 0650 07/29/19 0525 07/30/19 1143 08/01/19 0737 08/01/19 0737 08/03/19 0113 08/04/19 0652 08/05/19 0441  NA 138   < > 138   < > 135   < > 134* 133* 135  K 3.9   < > 3.3*   < > 3.9   < > 3.6 3.4* 3.7  CL 106   < > 105   < > 105   < > 100 103 104  CO2 23   < > 20*   < > 21*  --   --  20* 22  GLUCOSE 151*   < > 158*   < > 138*   < > 215* 171* 151*  BUN 13   < > 18   < > 13   < > 15 15 14   CREATININE 1.05   < > 1.33*   < > 1.31*   < > 1.10 1.32* 1.22  CALCIUM 8.4*   < > 9.3   < > 8.7*  --   --  8.4* 8.7*  MG 1.9  --  1.7  --   --   --   --  1.9 2.4  PHOS 3.5  --  3.0  --   --   --   --   --   --    < > = values in this interval not displayed.   Liver Function Tests: Recent Labs    07/28/19 1634 07/29/19 0525 08/03/19 0110  AST 39 44* 48*  ALT 29 31 34  ALKPHOS 138* 133* 141*  BILITOT 0.8 1.3* 0.8  PROT 8.9* 8.8* 9.4*  ALBUMIN 2.6* 2.5* 2.6*   Recent Labs    06/05/19 0755 07/28/19 1634 08/03/19 0111  LIPASE 34 36 65*   No  results for input(s): AMMONIA in the last 8760 hours. CBC: Recent Labs    06/15/19 0929 06/25/19 1338 07/28/19 1634 07/29/19 0525 08/03/19 0110 08/03/19 0110 08/03/19 0113 08/04/19 0652 08/05/19 0441  WBC 3.7*   < > 3.2*   < > 7.1  --   --  6.5 4.2  NEUTROABS 2.3  --  1.9  --  5.3  --   --   --   --   HGB 10.1*   < > 7.6*   < > 9.9*   < > 11.2* 8.4* 8.9*  HCT 32.4*   < > 24.5*   < > 33.5*   < > 33.0* 27.8* 29.4*  MCV 97.6   < > 84.5   < > 86.3  --   --  84.5 84.2  PLT 114*   < > PLATELET CLUMPS NOTED ON SMEAR, UNABLE TO ESTIMATE   < > 142*  --   --  130* 136*   < > = values in this interval not displayed.   Cardiac Enzymes: No results for input(s): CKTOTAL, CKMB, CKMBINDEX, TROPONINI in the last 8760 hours. BNP: Invalid input(s): POCBNP Lab Results  Component Value Date   HGBA1C 8.0 (H) 07/29/2019   Lab Results  Component Value Date   TSH 1.468 10/20/2018   Lab Results  Component Value Date   VITAMINB12 718 07/28/2019   Lab Results  Component Value Date   FOLATE 21.8 07/28/2019   Lab Results  Component Value Date   IRON 19 (L) 07/28/2019   TIBC 473 (H) 07/28/2019   FERRITIN 12 (L) 07/28/2019    Imaging and Procedures obtained prior to SNF admission: CT ABDOMEN PELVIS W CONTRAST  Result Date: 08/03/2019 CLINICAL DATA:  LEFT lower quadrant pain. EXAM: CT ABDOMEN AND PELVIS WITH CONTRAST TECHNIQUE: Multidetector CT imaging of the abdomen and pelvis was performed using the standard protocol following bolus administration of intravenous contrast. CONTRAST:  127mL OMNIPAQUE IOHEXOL 300 MG/ML  SOLN COMPARISON:  CT 07/28/2019 FINDINGS: Lower chest: New airspace consolidation in the LEFT lower lobe with some air bronchograms most consistent with pneumonia. Hepatobiliary: No focal hepatic lesion. Postcholecystectomy. No biliary dilatation. Pancreas: Pancreas is normal. No ductal dilatation. No pancreatic inflammation. Spleen: Normal spleen Adrenals/urinary tract: Adrenal  glands and kidneys are normal. The ureters and bladder normal. Foley catheter within the bladder. Stomach/Bowel: Partial gastrectomy anatomy. Large volume of food stuff in the stomach. Small bowel normal. Appendix and cecum normal. Colon rectosigmoid colon normal. Vascular/Lymphatic: Abdominal aorta is normal caliber with atherosclerotic calcification. There is no retroperitoneal or periportal lymphadenopathy. No pelvic lymphadenopathy. Reproductive: Prostate small. Other: No free fluid. Musculoskeletal: No aggressive osseous lesion. IMPRESSION: 1. LEFT lower lobe pneumonia. 2. No acute findings in the abdomen pelvis. 3. Foley catheter in bladder. Electronically Signed   By: Suzy Bouchard M.D.   On: 08/03/2019 04:15    Assessment/Plan 1. HCAP (healthcare-associated pneumonia) - due to #2, completed abx and recovering gradually  2. Pneumonia of left lower lobe due to infectious organism -completed abx from hospitalization -seems to be improving -ordered incentive spirometry to help prevent atelectasis  3. Atrial fibrillation with RVR ([place]) -had prior ablation for flutter -not on noac just baby asa due to prior  GI bleed   4. Chronic diastolic congestive heart failure (HCC) -not on diuretics chronically  5. Chronic obstructive pulmonary disease, unspecified COPD type ([place]) -not on inhalers or nebs at this time  6. Mixed hyperlipidemia -cont pravachol therapy   7. Chronic blood loss anemia -continues iron daily with breakfast, had prior transfusion when symptomatic  Family/ staff Communication: discussed with ADON  Labs/tests ordered:  Cbc, bmp per facility protocol  Dorina Ribaudo L. Shaneisha Burkel, D.O. [facility] [address], [place] [address] Cell Phone (Mon-Fri 8am-5pm):  [phone] On Call:  [phone] & follow prompts after 5pm & weekends Office Phone:  [phone] Office Fax:  [phone]

## 2019-08-05 NOTE — Progress Notes (Signed)
Report given to [facility], receiving nurse at [facility] All questions answered. Patient is awaiting transport via [facility] at this time.

## 2019-08-05 NOTE — Plan of Care (Signed)

## 2019-08-05 NOTE — Consult Note (Signed)
   [facility] CM Inpatient Consult   08/05/2019  [name] 1941-01-25 [id number]  [facility] ACO Patient:  Medicare NextGen  Patient screened for high risk score for unplanned readmission score with less than 7 day readmission.  Chart reviewed  to check if potential [place] Management services are needed.  Review of patient's medical record reveals patient is for a skilled nursing facility level of care at [facility].  Primary Care Provider: is  Primary Care at [facility], Nicolette Bang, DO  Plan:  Will alert [facility] RN Post Acute Care follow up at the facility for  progress and disposition to assess for post facility care management needs.    For questions contact:   Natividad Brood, RN BSN [facility] Liaison  [phone] business mobile phone Toll free office [phone]  Fax number: [phone] [email or web address]

## 2019-08-05 NOTE — TOC Transition Note (Signed)
Transition of Care [facility]) - CM/SW Discharge Note   Patient Details  Name: [name] MRN: [id number] Date of Birth: 03-13-1941  Transition of Care [facility]) CM/SW Contact:  Trula Ore, [place] Phone Number: 08/05/2019, 11:43 AM   Clinical Narrative:      Patient will DC to: [facility] date: 08/05/2019  Family notified: [name]  Transport by: Corey Harold  ?  Per MD patient ready for DC to [facility] . RN, patient, patient's family, and facility notified of DC. Discharge Summary sent to facility. RN given number for report tele# [phone] Rm#[phone]. DC packet on chart. Ambulance transport requested for patient.  CSW signing off.   Final next level of care: [place] Barriers to Discharge: No Barriers Identified   Patient Goals and CMS Choice Patient states their goals for this hospitalization and ongoing recovery are:: to go to skilled nursing facility CMS Medicare.gov Compare Post Acute Care list provided to:: Patient Represenative (must comment)(Daughter [name]) Choice offered to / list presented to : Adult Children([name])  Discharge Placement              Patient [facility] bed at: [facility] Patient to be transferred to facility by: [place] Name of family member notified: [name] Patient and family notified of of transfer: 08/05/19  Discharge Plan and Services                                     Social Determinants of Health (SDOH) Interventions     Readmission Risk Interventions No flowsheet data found.

## 2019-08-05 NOTE — TOC Initial Note (Signed)
Transition of Care [facility]) - Initial/Assessment Note    Patient Details  Name: [name] MRN: [id number] Date of Birth: 01/17/1941  Transition [facility]) CM/SW Contact:    Trula Ore, [place] Phone Number: 08/05/2019, 9:45 AM  Clinical Narrative:                  CSW spoke with Rober Minion who confirmed patient is good to discharge back there today. CSW tried to call daughter [name] to confirm. CSW waiting for call back from daughter.  CSW will continue to follow.  Expected Discharge Plan: Skilled Nursing Facility Barriers to Discharge: No Barriers Identified   Patient Goals and CMS Choice        Expected Discharge Plan and Services Expected Discharge Plan: [place]       Living arrangements for the past 2 months: [place]                                      Prior Living Arrangements/Services Living arrangements for the past 2 months: [place] Lives with:: Self Patient language and need for interpreter reviewed:: Yes Do you feel safe going back to the place where you live?: Yes      Need for Family Participation in Patient Care: Yes (Comment) Care giver support system in place?: Yes (comment)   Criminal Activity/Legal Involvement Pertinent to Current Situation/Hospitalization: No - Comment as needed  Activities of Daily Living      Permission Sought/Granted Permission sought to share information with : Case Manager, Customer service manager, Family Supports                Emotional Assessment       Orientation: : Oriented to Self, Oriented to Place, Oriented to  Time Alcohol / Substance Use: Not Applicable Psych Involvement: No (comment)  Admission diagnosis:  HCAP (healthcare-associated pneumonia) [J18.9] Acute pancreatitis, unspecified complication status, unspecified pancreatitis type [K85.90] Patient Active Problem List   Diagnosis Date Noted  . HCAP  (healthcare-associated pneumonia) 08/03/2019  . Cardiac pacemaker in situ 10/29/2018  . Major depressive disorder, recurrent severe without psychotic features ([place]) 04/15/2018  . Atrial fibrillation with RVR ([place]) 11/08/2017  . HTN (hypertension) 05/21/2017  . Diabetes mellitus, type II ([place]) 05/21/2017  . Diastolic CHF, chronic ([place]) 05/21/2017  . COPD (chronic obstructive pulmonary disease) ([place]) 05/21/2017  . H/O atrial flutter 05/21/2017  . Heme positive stool   . Symptomatic anemia 07/10/2016  . Cardiac syncope 05/15/2015  . Cervical disc disease 05/15/2015  . Anemia 05/15/2015  . History of CVA (cerebrovascular accident) 04/03/2014  . Benign neoplasm of rectum and anal canal 12/02/2013  . Tobacco use disorder 11/30/2013  . Mixed hyperlipidemia 06/06/2013  . Type 2 diabetes mellitus ([place]) 01/22/2013  . Obesity (BMI 30-39.9) 12/14/2012  . Persistent atrial fibrillation ([place]) 10/07/2010  . Atrial flutter - status post CTI ablation 07/29/2010  . Essential hypertension 07/29/2010  . Chronic diastolic congestive heart failure ([place]) 07/29/2010  . Atherosclerotic heart disease of native coronary artery with other forms of angina pectoris ([place]) 07/29/2010   PCP:  Nicolette Bang, DO Pharmacy:   [id number] - [place], [place] [address] Phone: [phone] Fax: [phone]     Social Determinants of Health (SDOH) Interventions    Readmission Risk Interventions No flowsheet data found.

## 2019-08-05 NOTE — Discharge Summary (Signed)
Physician Discharge Summary  [name] [id number] DOB: 80-01-1941 DOA: 08/02/2019  PCP: Trevor Bang, DO  Admit date: 08/02/2019 Discharge date: 08/05/2019  Admitted From: SNF ([facility]) Disposition:  SNF ([facility])  Recommendations for Outpatient Follow-up:  1. Follow up with PCP in 1-2 weeks 2. Please obtain BMP/CBC in one week 3. Please follow up on the following pending results:  Discharge Condition: Stable CODE STATUS: Full Diet recommendation: Diabetic diet as tolerated  Brief/Interim Summary: Trevor Powellis a 79 y.o.malewith PMH ofchronic normocytic anemia atrial flutter/atrial fibrillation(not on anticoagulation due to history of GI bleed),chronic diastolic heart failure, COPD, T2DM,nonhemorrhagic CVA who presented to ED with abdominal pain and found to have HCAP. Patient is a poor historian but reports epigastric and LUQ/LLQ pain for several weeks. Reports pain is sharp but does not radiate. Reports constant pain and worsening. Nothing has helped pain, worse by eating certain foods and reports he is eating "light" foods only. Reports that he is very hungry and thirsty currently. He believes he is having an episode of pancreatitis which he has had before. Reports cough and SOB for several weeks as well.Denies headache, dizziness, fever, chills, chest pain, nausea, vomiting, diarrhea, constipation, dysuria, hematuria, hematochezia, melena, difficulty moving arms/legs, speech difficulty, confusion or any other complaints. In the [id number] stable on room air. Labs remarkable forWBC 7.1, Hgb 9.9, AST 48, Lipase 65, glucose 215. UA also appears infected but patient has chronic catheter in place.  Patient presents with a constellation of complaints, somewhat poor historian but ANO x4.  Imaging questionable for left lower lobe HCAP pneumonia although patient is without hypoxia or fever or leukocytosis.  He does appear to be somewhat constipated on imaging(was  otherwise negative) as well, states he does not recall his last bowel movement.  After large BM patient reports feeling much improved, still complains about headache, neck pain, shoulder pain, generalized weakness fatigue but denies chest pain, shortness of breath, nausea, vomiting.  He also indicates now that his constipation has resolved he is running "too fast" as such we have discontinued his bowel regimen.  Patient has been transitioned to p.o. antibiotics, his constipation has resolved and otherwise is stable and he and family are agreeable for discharge back to facility.  Close follow-up with PCP in the next 3 to 5 days as scheduled no medication changes other than initiation of antibiotics as below as well as as needed Senokot for constipation.  Discharge Diagnoses:  Principal Problem:   HCAP (healthcare-associated pneumonia) Active Problems:   Essential hypertension   Chronic diastolic congestive heart failure (HCC)   Persistent atrial fibrillation (HCC)   Mixed hyperlipidemia   Type 2 diabetes mellitus (HCC)   COPD (chronic obstructive pulmonary disease) (HCC)   Major depressive disorder, recurrent severe without psychotic features [facility])    Discharge Instructions  Discharge Instructions    Call MD for:  difficulty breathing, headache or visual disturbances   Complete by: As directed    Call MD for:  extreme fatigue   Complete by: As directed    Call MD for:  hives   Complete by: As directed    Call MD for:  persistant dizziness or light-headedness   Complete by: As directed    Call MD for:  persistant nausea and vomiting   Complete by: As directed    Call MD for:  severe uncontrolled pain   Complete by: As directed    Call MD for:  temperature >100.4   Complete by: As directed  Diet - low sodium heart healthy   Complete by: As directed    Increase activity slowly   Complete by: As directed      Allergies as of 08/05/2019      Reactions   Cyclobenzaprine Other (See  Comments)   Unsteady gait   Other Other (See Comments)   Steroids speeds his heart rate      Medication List    TAKE these medications   Accu-Chek FastClix Lancets Misc USE TO TEST BLOOD SUGAR TWICE DAILY. E11.00   Accu-Chek Guide test strip Generic drug: glucose blood USE TO TEST BLOOD SUGAR TWICE DAILY. E11.00   aspirin EC 81 MG tablet Take 81 mg by mouth daily.   cephALEXin 250 MG capsule Commonly known as: KEFLEX Take 1 capsule (250 mg total) by mouth 4 (four) times daily for 3 days.   citalopram 20 MG tablet Commonly known as: CELEXA Take 1 tablet (20 mg total) by mouth daily.   diltiazem 120 MG 24 hr capsule Commonly known as: CARDIZEM CD Take 1 capsule (120 mg total) by mouth 2 (two) times daily.   ferrous sulfate 325 (65 FE) MG tablet Take 325 mg by mouth daily with breakfast.   gabapentin 100 MG capsule Commonly known as: NEURONTIN Take 1 capsule (100 mg total) by mouth at bedtime.   glimepiride 2 MG tablet Commonly known as: AMARYL Take 0.5 tablets (1 mg total) by mouth daily before breakfast.   nitroGLYCERIN 0.4 MG SL tablet Commonly known as: NITROSTAT Place 1 tablet (0.4 mg total) under the tongue every 5 (five) minutes x 3 doses as needed for chest pain.   pantoprazole 40 MG tablet Commonly known as: PROTONIX Take 1 tablet (40 mg total) by mouth 2 (two) times daily before a meal. Dx: K21.9   pravastatin 40 MG tablet Commonly known as: PRAVACHOL Take 1 tablet (40 mg total) by mouth every evening.   senna-docusate 8.6-50 MG tablet Commonly known as: Senokot-S Take 1 tablet by mouth 2 (two) times daily as needed for mild constipation or moderate constipation.   tamsulosin 0.4 MG Caps capsule Commonly known as: FLOMAX Take 1 capsule (0.4 mg total) by mouth daily.       Allergies  Allergen Reactions  . Cyclobenzaprine Other (See Comments)    Unsteady gait  . Other Other (See Comments)    Steroids speeds his heart rate     Procedures/Studies: DG Ribs Unilateral W/Chest Left  Result Date: 07/28/2019 CLINICAL DATA:  Multiple recent falls.  Left hip, rib and chest pain EXAM: LEFT RIBS AND CHEST - 3+ VIEW COMPARISON:  None. FINDINGS: No fracture or other bone lesions are seen involving the ribs. There is no evidence of pneumothorax or pleural effusion. Mild bilateral interstitial thickening similar in appearance to the prior exams. No focal consolidation, pleural effusion or pneumothorax. Heart size and mediastinal contours are within normal limits. Dual lead cardiac pacemaker. IMPRESSION: Negative. Electronically Signed   By: Kathreen Devoid   On: 07/28/2019 17:06   DG Abd 1 View  Result Date: 07/28/2019 CLINICAL DATA:  Multiple recent falls.  Left hip rib and chest pain EXAM: ABDOMEN - 1 VIEW COMPARISON:  None. FINDINGS: Prior cholecystectomy. Nonobstructive bowel gas pattern. No organomegaly or free air. Degenerative changes within the lumbar spine. No acute bony abnormality. IMPRESSION: No acute findings. Electronically Signed   By: Rolm Baptise M.D.   On: 07/28/2019 17:33   CT Head Wo Contrast  Result Date: 07/28/2019 CLINICAL DATA:  79 year old male  with trauma. EXAM: CT HEAD WITHOUT CONTRAST CT CERVICAL SPINE WITHOUT CONTRAST TECHNIQUE: Multidetector CT imaging of the head and cervical spine was performed following the standard protocol without intravenous contrast. Multiplanar CT image reconstructions of the cervical spine were also generated. COMPARISON:  CT dated 11/14/2018. FINDINGS: CT HEAD FINDINGS Brain: Moderate age-related atrophy and chronic microvascular ischemic changes. There is no acute intracranial hemorrhage. No mass effect or midline shift. No extra-axial fluid collection. Vascular: No hyperdense vessel or unexpected calcification. Skull: Normal. Negative for fracture or focal lesion. Sinuses/Orbits: No acute finding. Other: None. CT CERVICAL SPINE FINDINGS Alignment: No acute subluxation. There is  straightening of normal cervical lordosis which may be positional or due to muscle spasm or chronic. Skull base and vertebrae: No acute fracture. 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 There has been interval increase in the distraction gap since the prior CT. Soft tissues and spinal canal: No prevertebral fluid or swelling. No visible canal hematoma. Disc levels:  Multilevel degenerative changes with bone spurring. Upper chest: Negative. Other: Partially visualized pacemaker wire. Bilateral carotid bulb calcified plaques. Probable subcutaneous lipoma of the posterior neck. IMPRESSION: 1. No acute intracranial pathology. Moderate age-related atrophy and chronic microvascular ischemic changes. 2. No acute/traumatic cervical spine pathology. Old fracture of the dens with nonunion and increased distraction. Electronically Signed   By: Anner Crete M.D.   On: 07/28/2019 21:54   CT Chest W Contrast  Result Date: 07/28/2019 CLINICAL DATA:  79 year old male with trauma. Left-sided chest pain. EXAM: CT CHEST, ABDOMEN, AND PELVIS WITH CONTRAST TECHNIQUE: Multidetector CT imaging of the chest, abdomen and pelvis was performed following the standard protocol during bolus administration of intravenous contrast. CONTRAST:  176mL OMNIPAQUE IOHEXOL 350 MG/ML SOLN COMPARISON:  Chest radiograph dated 11/14/2018. FINDINGS: CT CHEST FINDINGS Cardiovascular: There is no cardiomegaly or pericardial effusion. There is 3 vessel coronary vascular calcification. Left-sided pacemaker. There is moderate atherosclerotic calcification of the thoracic aorta. No aneurysmal dilatation or dissection. The origins of the great vessels of the aortic arch appear patent as visualized. The central pulmonary arteries are grossly unremarkable. Mediastinum/Nodes: There is no hilar or mediastinal adenopathy. The esophagus and the thyroid gland are grossly unremarkable. No mediastinal fluid collection or hematoma. Lungs/Pleura:  The lungs are clear. There is a 4 mm right middle lobe calcified granuloma. There is no pleural effusion or pneumothorax. The central airways are patent. Musculoskeletal: No acute osseous pathology. Degenerative changes of the spine. CT ABDOMEN PELVIS FINDINGS No intra-abdominal free air. Small free fluid within the pelvis. Hepatobiliary: Fatty infiltration of the liver with morphologic changes of early cirrhosis. Cholecystectomy. Pancreas: Mild stranding of the peripancreatic fat may be related to underlying liver disease. Correlation with pancreatic enzymes recommended to exclude pancreatitis. Spleen: Normal in size without focal abnormality. Adrenals/Urinary Tract: The adrenal glands are unremarkable. There is no hydronephrosis on either side. There is symmetric enhancement and excretion of contrast by both kidneys. The visualized ureters appear unremarkable. The urinary bladder is decompressed around a Foley catheter. Two adjacent calcific foci with the larger measuring 4 mm along the bladder wall adjacent to the balloon of the Foley catheter may represent focal calcification of the bladder wall or stone within the bladder. Stomach/Bowel: Postsurgical changes of the gastroesophageal junction. There is postsurgical changes of the bowel. There is moderate stool throughout the colon. There are scattered colonic diverticula without active inflammatory changes. There is no bowel obstruction or active inflammation. The appendix is normal. Vascular/Lymphatic: Advanced aortoiliac atherosclerotic disease.  There is a 2.5 cm infrarenal aortic ectasia. The IVC is unremarkable. No portal venous gas. There is no adenopathy. Reproductive: The prostate is suboptimally visualized. Other: Mild diffuse subcutaneous edema. Musculoskeletal: Osteopenia with degenerative changes of the spine. No acute osseous pathology. IMPRESSION: 1. No traumatic intrathoracic, abdominal, or pelvic pathology. 2. Fatty liver with morphologic changes  of early cirrhosis. 3. Mild stranding of the peripancreatic fat may be related to underlying liver disease. Correlation with pancreatic enzymes recommended to exclude pancreatitis. 4. Colonic diverticulosis. No bowel obstruction. Normal appendix. 5. Aortic Atherosclerosis (ICD10-I70.0). Electronically Signed   By: Anner Crete M.D.   On: 07/28/2019 22:19   CT Cervical Spine Wo Contrast  Result Date: 07/28/2019 CLINICAL DATA:  79 year old male with trauma. EXAM: CT HEAD WITHOUT CONTRAST CT CERVICAL SPINE WITHOUT CONTRAST TECHNIQUE: Multidetector CT imaging of the head and cervical spine was performed following the standard protocol without intravenous contrast. Multiplanar CT image reconstructions of the cervical spine were also generated. COMPARISON:  CT dated 11/14/2018. FINDINGS: CT HEAD FINDINGS Brain: Moderate age-related atrophy and chronic microvascular ischemic changes. There is no acute intracranial hemorrhage. No mass effect or midline shift. No extra-axial fluid collection. Vascular: No hyperdense vessel or unexpected calcification. Skull: Normal. Negative for fracture or focal lesion. Sinuses/Orbits: No acute finding. Other: None. CT CERVICAL SPINE FINDINGS Alignment: No acute subluxation. There is straightening of normal cervical lordosis which may be positional or due to muscle spasm or chronic. Skull base and vertebrae: No acute fracture. 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 There has been interval increase in the distraction gap since the prior CT. Soft tissues and spinal canal: No prevertebral fluid or swelling. No visible canal hematoma. Disc levels:  Multilevel degenerative changes with bone spurring. Upper chest: Negative. Other: Partially visualized pacemaker wire. Bilateral carotid bulb calcified plaques. Probable subcutaneous lipoma of the posterior neck. IMPRESSION: 1. No acute intracranial pathology. Moderate age-related atrophy and chronic microvascular  ischemic changes. 2. No acute/traumatic cervical spine pathology. Old fracture of the dens with nonunion and increased distraction. Electronically Signed   By: Anner Crete M.D.   On: 07/28/2019 21:54   CT ABDOMEN PELVIS W CONTRAST  Result Date: 08/03/2019 CLINICAL DATA:  LEFT lower quadrant pain. EXAM: CT ABDOMEN AND PELVIS WITH CONTRAST TECHNIQUE: Multidetector CT imaging of the abdomen and pelvis was performed using the standard protocol following bolus administration of intravenous contrast. CONTRAST:  119mL OMNIPAQUE IOHEXOL 300 MG/ML  SOLN COMPARISON:  CT 07/28/2019 FINDINGS: Lower chest: New airspace consolidation in the LEFT lower lobe with some air bronchograms most consistent with pneumonia. Hepatobiliary: No focal hepatic lesion. Postcholecystectomy. No biliary dilatation. Pancreas: Pancreas is normal. No ductal dilatation. No pancreatic inflammation. Spleen: Normal spleen Adrenals/urinary tract: Adrenal glands and kidneys are normal. The ureters and bladder normal. Foley catheter within the bladder. Stomach/Bowel: Partial gastrectomy anatomy. Large volume of food stuff in the stomach. Small bowel normal. Appendix and cecum normal. Colon rectosigmoid colon normal. Vascular/Lymphatic: Abdominal aorta is normal caliber with atherosclerotic calcification. There is no retroperitoneal or periportal lymphadenopathy. No pelvic lymphadenopathy. Reproductive: Prostate small. Other: No free fluid. Musculoskeletal: No aggressive osseous lesion. IMPRESSION: 1. LEFT lower lobe pneumonia. 2. No acute findings in the abdomen pelvis. 3. Foley catheter in bladder. Electronically Signed   By: Suzy Bouchard M.D.   On: 08/03/2019 04:15   CT ABDOMEN PELVIS W CONTRAST  Result Date: 07/28/2019 CLINICAL DATA:  79 year old male with trauma. Left-sided chest pain. EXAM: CT CHEST, ABDOMEN, AND PELVIS  WITH CONTRAST TECHNIQUE: Multidetector CT imaging of the chest, abdomen and pelvis was performed following the standard  protocol during bolus administration of intravenous contrast. CONTRAST:  14mL OMNIPAQUE IOHEXOL 350 MG/ML SOLN COMPARISON:  Chest radiograph dated 11/14/2018. FINDINGS: CT CHEST FINDINGS Cardiovascular: There is no cardiomegaly or pericardial effusion. There is 3 vessel coronary vascular calcification. Left-sided pacemaker. There is moderate atherosclerotic calcification of the thoracic aorta. No aneurysmal dilatation or dissection. The origins of the great vessels of the aortic arch appear patent as visualized. The central pulmonary arteries are grossly unremarkable. Mediastinum/Nodes: There is no hilar or mediastinal adenopathy. The esophagus and the thyroid gland are grossly unremarkable. No mediastinal fluid collection or hematoma. Lungs/Pleura: The lungs are clear. There is a 4 mm right middle lobe calcified granuloma. There is no pleural effusion or pneumothorax. The central airways are patent. Musculoskeletal: No acute osseous pathology. Degenerative changes of the spine. CT ABDOMEN PELVIS FINDINGS No intra-abdominal free air. Small free fluid within the pelvis. Hepatobiliary: Fatty infiltration of the liver with morphologic changes of early cirrhosis. Cholecystectomy. Pancreas: Mild stranding of the peripancreatic fat may be related to underlying liver disease. Correlation with pancreatic enzymes recommended to exclude pancreatitis. Spleen: Normal in size without focal abnormality. Adrenals/Urinary Tract: The adrenal glands are unremarkable. There is no hydronephrosis on either side. There is symmetric enhancement and excretion of contrast by both kidneys. The visualized ureters appear unremarkable. The urinary bladder is decompressed around a Foley catheter. Two adjacent calcific foci with the larger measuring 4 mm along the bladder wall adjacent to the balloon of the Foley catheter may represent focal calcification of the bladder wall or stone within the bladder. Stomach/Bowel: Postsurgical changes of the  gastroesophageal junction. There is postsurgical changes of the bowel. There is moderate stool throughout the colon. There are scattered colonic diverticula without active inflammatory changes. There is no bowel obstruction or active inflammation. The appendix is normal. Vascular/Lymphatic: Advanced aortoiliac atherosclerotic disease. There is a 2.5 cm infrarenal aortic ectasia. The IVC is unremarkable. No portal venous gas. There is no adenopathy. Reproductive: The prostate is suboptimally visualized. Other: Mild diffuse subcutaneous edema. Musculoskeletal: Osteopenia with degenerative changes of the spine. No acute osseous pathology. IMPRESSION: 1. No traumatic intrathoracic, abdominal, or pelvic pathology. 2. Fatty liver with morphologic changes of early cirrhosis. 3. Mild stranding of the peripancreatic fat may be related to underlying liver disease. Correlation with pancreatic enzymes recommended to exclude pancreatitis. 4. Colonic diverticulosis. No bowel obstruction. Normal appendix. 5. Aortic Atherosclerosis (ICD10-I70.0). Electronically Signed   By: Anner Crete M.D.   On: 07/28/2019 22:19   CUP PACEART REMOTE DEVICE CHECK  Result Date: 07/21/2019 Scheduled remote reviewed. Normal device function.  55 AMS (84%). Known persistent AF, no OAC due to GIB. 4 new HVR episodes (longest 15 min 42 sec); appears AF/RVR. Next remote 91 days. Felisa Bonier, RN, MSN  DG Hip Unilat W or Wo Pelvis 1 View Left  Result Date: 07/28/2019 CLINICAL DATA:  Fall, left hip pain EXAM: DG HIP (WITH OR WITHOUT PELVIS) 1V*L* COMPARISON:  04/15/2018 FINDINGS: Hip joints are symmetric. Joint spaces maintained. No acute bony abnormality. Specifically, no fracture, subluxation, or dislocation. IMPRESSION: No acute bony abnormality. Electronically Signed   By: Rolm Baptise M.D.   On: 07/28/2019 17:34      Subjective: No acute issues or events overnight, patient's abdominal pain has resolved after bowel movement, only complaint  this morning is somewhat loose stool again in the setting of aggressive bowel regimen.  Patient denies  headache, fever, chills, nausea, vomiting, chest pain, shortness of breath.   Discharge Exam: Vitals:   08/05/19 0017 08/05/19 0247  BP: (!) 102/57 104/69  Pulse: 71 77  Resp: 17 17  Temp: 97.7 F (36.5 C) 97.9 F (36.6 C)  SpO2: 94% 95%   Vitals:   08/04/19 1408 08/04/19 2017 08/05/19 0017 08/05/19 0247  BP: (!) 92/48 (!) 109/50 (!) 102/57 104/69  Pulse: 69 96 71 77  Resp: 18 15 17 17   Temp: 98.2 F (36.8 C) 97.6 F (36.4 C) 97.7 F (36.5 C) 97.9 F (36.6 C)  TempSrc: Oral Oral Oral Oral  SpO2: 95% 95% 94% 95%  Weight:    79.2 kg  Height:        General: Pt is alert, awake, not in acute distress Cardiovascular: RRR, S1/S2 +, no rubs, no gallops Respiratory: CTA bilaterally, no wheezing, no rhonchi Abdominal: Soft, NT, ND, bowel sounds + Extremities: no edema, no cyanosis    The results of significant diagnostics from this hospitalization (including imaging, microbiology, ancillary and laboratory) are listed below for reference.     Microbiology: Recent Results (from the past 240 hour(s))  Urine culture     Status: Abnormal   Collection Time: 07/28/19  4:34 PM   Specimen: Urine, Random  Result Value Ref Range Status   Specimen Description URINE, RANDOM  Final   Special Requests   Final    NONE Performed at [facility], [address] [place], [place] [address]    Culture MULTIPLE SPECIES PRESENT, SUGGEST RECOLLECTION (A)  Final   Report Status 07/29/2019 FINAL  Final  Respiratory Panel by RT PCR (Flu A&B, Covid) - Nasopharyngeal Swab     Status: None   Collection Time: 07/28/19  8:20 PM   Specimen: Nasopharyngeal Swab  Result Value Ref Range Status   SARS Coronavirus 2 by RT PCR NEGATIVE NEGATIVE Final    Comment: (NOTE) SARS-CoV-2 target nucleic acids are NOT DETECTED. The SARS-CoV-2 RNA is generally detectable in upper respiratoy specimens  during the acute phase of infection. The lowest concentration of SARS-CoV-2 viral copies this assay can detect is 131 copies/mL. A negative result does not preclude SARS-Cov-2 infection and should not be used as the sole basis for treatment or other patient management decisions. A negative result may occur with  improper specimen collection/handling, submission of specimen other than nasopharyngeal swab, presence of viral mutation(s) within the areas targeted by this assay, and inadequate number of viral copies (<131 copies/mL). A negative result must be combined with clinical observations, patient history, and epidemiological information. The expected result is Negative. Fact Sheet for Patients:  PinkCheek.be Fact Sheet for Healthcare Providers:  GravelBags.it This test is not yet ap proved or cleared by the [place] FDA and  has been authorized for detection and/or diagnosis of SARS-CoV-2 by FDA under an Emergency Use Authorization (EUA). This EUA will remain  in effect (meaning this test can be used) for the duration of the COVID-19 declaration under Section 564(b)(1) of the Act, 21 U.S.C. section 360bbb-3(b)(1), unless the authorization is terminated or revoked sooner.    Influenza A by PCR NEGATIVE NEGATIVE Final   Influenza B by PCR NEGATIVE NEGATIVE Final    Comment: (NOTE) The Xpert Xpress SARS-CoV-2/FLU/RSV assay is intended as an aid in  the diagnosis of influenza from Nasopharyngeal swab specimens and  should not be used as a sole basis for treatment. Nasal washings and  aspirates are unacceptable for Xpert Xpress SARS-CoV-2/FLU/RSV  testing. Fact Sheet for Patients: PinkCheek.be Fact Sheet for Healthcare Providers: GravelBags.it This test is not yet approved or cleared by the [place] FDA and  has been authorized for detection and/or diagnosis of  SARS-CoV-2 by  FDA under an Emergency Use Authorization (EUA). This EUA will remain  in effect (meaning this test can be used) for the duration of the  Covid-19 declaration under Section 564(b)(1) of the Act, 21  U.S.C. section 360bbb-3(b)(1), unless the authorization is  terminated or revoked. Performed at [facility], [place] [address] [place], [place] [address]   SARS CORONAVIRUS 2 (TAT 6-24 HRS) Nasopharyngeal Nasopharyngeal Swab     Status: None   Collection Time: 08/01/19 12:28 AM   Specimen: Nasopharyngeal Swab  Result Value Ref Range Status   SARS Coronavirus 2 NEGATIVE NEGATIVE Final    Comment: (NOTE) SARS-CoV-2 target nucleic acids are NOT DETECTED. The SARS-CoV-2 RNA is generally detectable in upper and lower respiratory specimens during the acute phase of infection. Negative results do not preclude SARS-CoV-2 infection, do not rule out co-infections with other pathogens, and should not be used as the sole basis for treatment or other patient management decisions. Negative results must be combined with clinical observations, patient history, and epidemiological information. The expected result is Negative. Fact Sheet for Patients: SugarRoll.be Fact Sheet for Healthcare Providers: https://www.woods-mathews.com/ This test is not yet approved or cleared by the [place] FDA and  has been authorized for detection and/or diagnosis of SARS-CoV-2 by FDA under an Emergency Use Authorization (EUA). This EUA will remain  in effect (meaning this test can be used) for the duration of the COVID-19 declaration under Section 56 4(b)(1) of the Act, 21 U.S.C. section 360bbb-3(b)(1), unless the authorization is terminated or revoked sooner. Performed at [facility], [place] [address], [place], [place] [address]   Urine culture     Status: Abnormal   Collection Time: 08/03/19  4:01 AM   Specimen: Urine, Catheterized  Result Value  Ref Range Status   Specimen Description URINE, CATHETERIZED  Final   Special Requests   Final    Normal Performed at [facility], [place] [address], [place], [place] [address]    Culture (A)  Final    >=100,000 COLONIES/mL PSEUDOMONAS AERUGINOSA >=100,000 COLONIES/mL ENTEROCOCCUS FAECALIS    Report Status 08/05/2019 FINAL  Final   Organism ID, Bacteria PSEUDOMONAS AERUGINOSA (A)  Final   Organism ID, Bacteria ENTEROCOCCUS FAECALIS (A)  Final      Susceptibility   Enterococcus faecalis - MIC*    AMPICILLIN <=2 SENSITIVE Sensitive     NITROFURANTOIN <=16 SENSITIVE Sensitive     VANCOMYCIN 1 SENSITIVE Sensitive     * >=100,000 COLONIES/mL ENTEROCOCCUS FAECALIS   Pseudomonas aeruginosa - MIC*    CEFTAZIDIME 4 SENSITIVE Sensitive     CIPROFLOXACIN <=0.25 SENSITIVE Sensitive     GENTAMICIN 2 SENSITIVE Sensitive     IMIPENEM 2 SENSITIVE Sensitive     PIP/TAZO 8 SENSITIVE Sensitive     CEFEPIME 4 SENSITIVE Sensitive     * >=100,000 COLONIES/mL PSEUDOMONAS AERUGINOSA  Blood culture (routine x 2)     Status: None (Preliminary result)   Collection Time: 08/03/19  5:44 AM   Specimen: BLOOD  Result Value Ref Range Status   Specimen Description BLOOD RIGHT ARM  Final   Special Requests   Final    BOTTLES DRAWN AEROBIC AND ANAEROBIC Blood Culture adequate volume   Culture   Final  NO GROWTH 2 DAYS Performed at [facility], [place] [address], [place], [place] [address]    Report Status PENDING  Incomplete  Respiratory Panel by RT PCR (Flu A&B, Covid) - Nasopharyngeal Swab     Status: None   Collection Time: 08/03/19  5:46 AM   Specimen: Nasopharyngeal Swab  Result Value Ref Range Status   SARS Coronavirus 2 by RT PCR NEGATIVE NEGATIVE Final    Comment: (NOTE) SARS-CoV-2 target nucleic acids are NOT DETECTED. The SARS-CoV-2 RNA is generally detectable in upper respiratoy specimens during the acute phase of infection. The lowest concentration of SARS-CoV-2 viral copies this  assay can detect is 131 copies/mL. A negative result does not preclude SARS-Cov-2 infection and should not be used as the sole basis for treatment or other patient management decisions. A negative result may occur with  improper specimen collection/handling, submission of specimen other than nasopharyngeal swab, presence of viral mutation(s) within the areas targeted by this assay, and inadequate number of viral copies (<131 copies/mL). A negative result must be combined with clinical observations, patient history, and epidemiological information. The expected result is Negative. Fact Sheet for Patients:  PinkCheek.be Fact Sheet for Healthcare Providers:  GravelBags.it This test is not yet ap proved or cleared by the [place] FDA and  has been authorized for detection and/or diagnosis of SARS-CoV-2 by FDA under an Emergency Use Authorization (EUA). This EUA will remain  in effect (meaning this test can be used) for the duration of the COVID-19 declaration under Section 564(b)(1) of the Act, 21 U.S.C. section 360bbb-3(b)(1), unless the authorization is terminated or revoked sooner.    Influenza A by PCR NEGATIVE NEGATIVE Final   Influenza B by PCR NEGATIVE NEGATIVE Final    Comment: (NOTE) The Xpert Xpress SARS-CoV-2/FLU/RSV assay is intended as an aid in  the diagnosis of influenza from Nasopharyngeal swab specimens and  should not be used as a sole basis for treatment. Nasal washings and  aspirates are unacceptable for Xpert Xpress SARS-CoV-2/FLU/RSV  testing. Fact Sheet for Patients: PinkCheek.be Fact Sheet for Healthcare Providers: GravelBags.it This test is not yet approved or cleared by the [place] FDA and  has been authorized for detection and/or diagnosis of SARS-CoV-2 by  FDA under an Emergency Use Authorization (EUA). This EUA will remain  in  effect (meaning this test can be used) for the duration of the  Covid-19 declaration under Section 564(b)(1) of the Act, 21  U.S.C. section 360bbb-3(b)(1), unless the authorization is  terminated or revoked. Performed at [facility], [place] [address], [place], [place] [address]   Blood culture (routine x 2)     Status: Abnormal   Collection Time: 08/03/19  7:49 AM   Specimen: BLOOD RIGHT ARM  Result Value Ref Range Status   Specimen Description BLOOD RIGHT ARM  Final   Special Requests   Final    BOTTLES DRAWN AEROBIC AND ANAEROBIC Blood Culture adequate volume   Culture  Setup Time (A)  Final    GRAM VARIABLE ROD ANAEROBIC BOTTLE ONLY Organism ID to follow NO ORGANISM DETECTED CRITICAL RESULT CALLED TO, READ BACK BY AND VERIFIED WITH: PHARMD JAMES LEDFORD AT 2305 BY [place] ON 08/03/2019 Performed at [facility], [address], [place], [place] [address]    Culture CLOSTRIDIUM PERFRINGENS (A)  Final   Report Status 08/05/2019 FINAL  Final  Blood Culture ID Panel (Reflexed)     Status: None   Collection Time: 08/03/19  7:49 AM  Result Value Ref Range Status   Enterococcus species NOT DETECTED NOT DETECTED Final   Listeria monocytogenes NOT DETECTED NOT DETECTED Final   Staphylococcus species NOT DETECTED NOT DETECTED Final   Staphylococcus aureus (BCID) NOT DETECTED NOT DETECTED Final   Streptococcus species NOT DETECTED NOT DETECTED Final   Streptococcus agalactiae NOT DETECTED NOT DETECTED Final   Streptococcus pneumoniae NOT DETECTED NOT DETECTED Final   Streptococcus pyogenes NOT DETECTED NOT DETECTED Final   Acinetobacter baumannii NOT DETECTED NOT DETECTED Final   Enterobacteriaceae species NOT DETECTED NOT DETECTED Final   Enterobacter cloacae complex NOT DETECTED NOT DETECTED Final   Escherichia coli NOT DETECTED NOT DETECTED Final   Klebsiella oxytoca NOT DETECTED NOT DETECTED Final   Klebsiella pneumoniae NOT DETECTED NOT DETECTED Final   Proteus  species NOT DETECTED NOT DETECTED Final   Serratia marcescens NOT DETECTED NOT DETECTED Final   Haemophilus influenzae NOT DETECTED NOT DETECTED Final   Neisseria meningitidis NOT DETECTED NOT DETECTED Final   Pseudomonas aeruginosa NOT DETECTED NOT DETECTED Final   Candida albicans NOT DETECTED NOT DETECTED Final   Candida glabrata NOT DETECTED NOT DETECTED Final   Candida krusei NOT DETECTED NOT DETECTED Final   Candida parapsilosis NOT DETECTED NOT DETECTED Final   Candida tropicalis NOT DETECTED NOT DETECTED Final    Comment: Performed at [facility], [address], [place], [place] [address]  MRSA PCR Screening     Status: None   Collection Time: 08/03/19  5:46 PM   Specimen: Nasopharyngeal  Result Value Ref Range Status   MRSA by PCR NEGATIVE NEGATIVE Final    Comment:        The GeneXpert MRSA Assay (FDA approved for NASAL specimens only), is one component of a comprehensive MRSA colonization surveillance program. It is not intended to diagnose MRSA infection nor to guide or monitor treatment for MRSA infections. Performed at [facility], [place] [address] [place],  [address]      Labs: BNP (last 3 results) No results for input(s): BNP in the last 8760 hours. Basic Metabolic Panel: Recent Labs  Lab 07/30/19 1143 07/30/19 1143 07/31/19 0453 08/01/19 0737 08/03/19 0113 08/04/19 0652 08/05/19 0441  NA 134*   < > 136 135 134* 133* 135  K 4.2   < > 4.2 3.9 3.6 3.4* 3.7  CL 104   < > 107 105 100 103 104  CO2 19*  --  20* 21*  --  20* 22  GLUCOSE 196*   < > 147* 138* 215* 171* 151*  BUN 20   < > 19 13 15 15 14   CREATININE 1.47*   < > 1.53* 1.31* 1.10 1.32* 1.22  CALCIUM 8.6*  --  8.9 8.7*  --  8.4* 8.7*  MG  --   --   --   --   --  1.9 2.4   < > = values in this interval not displayed.   Liver Function Tests: Recent Labs  Lab 08/03/19 0110  AST 48*  ALT 34  ALKPHOS 141*  BILITOT 0.8  PROT 9.4*  ALBUMIN 2.6*   Recent Labs  Lab  08/03/19 0111  LIPASE 65*   No results for input(s): AMMONIA in the last 168 hours. CBC: Recent Labs  Lab 07/30/19 1000 08/03/19 0110 08/03/19 0113 08/04/19 0652 08/05/19 0441  WBC 4.3 7.1  --  6.5 4.2  NEUTROABS  --  5.3  --   --   --  HGB 8.9* 9.9* 11.2* 8.4* 8.9*  HCT 29.2* 33.5* 33.0* 27.8* 29.4*  MCV 84.9 86.3  --  84.5 84.2  PLT 133* 142*  --  130* 136*   Cardiac Enzymes: No results for input(s): CKTOTAL, CKMB, CKMBINDEX, TROPONINI in the last 168 hours. BNP: Invalid input(s): POCBNP CBG: Recent Labs  Lab 08/04/19 0721 08/04/19 1150 08/04/19 1624 08/04/19 2149 08/05/19 0750  GLUCAP 155* 186* 158* 148* 153*   D-Dimer No results for input(s): DDIMER in the last 72 hours. Hgb A1c No results for input(s): HGBA1C in the last 72 hours. Lipid Profile No results for input(s): CHOL, HDL, LDLCALC, TRIG, CHOLHDL, LDLDIRECT in the last 72 hours. Thyroid function studies No results for input(s): TSH, T4TOTAL, T3FREE, THYROIDAB in the last 72 hours.  Invalid input(s): FREET3 Anemia work up No results for input(s): VITAMINB12, FOLATE, FERRITIN, TIBC, IRON, RETICCTPCT in the last 72 hours. Urinalysis    Component Value Date/Time   COLORURINE YELLOW 08/03/2019 0402   APPEARANCEUR HAZY (A) 08/03/2019 0402   LABSPEC >1.046 (H) 08/03/2019 0402   PHURINE 5.0 08/03/2019 0402   GLUCOSEU NEGATIVE 08/03/2019 0402   HGBUR MODERATE (A) 08/03/2019 0402   BILIRUBINUR NEGATIVE 08/03/2019 0402   BILIRUBINUR negative 10/02/2018 0915   KETONESUR NEGATIVE 08/03/2019 0402   PROTEINUR 100 (A) 08/03/2019 0402   UROBILINOGEN 0.2 10/02/2018 0915   UROBILINOGEN 0.2 10/09/2014 2315   NITRITE POSITIVE (A) 08/03/2019 0402   LEUKOCYTESUR MODERATE (A) 08/03/2019 0402   Sepsis Labs Invalid input(s): PROCALCITONIN,  WBC,  LACTICIDVEN Microbiology Recent Results (from the past 240 hour(s))  Urine culture     Status: Abnormal   Collection Time: 07/28/19  4:34 PM   Specimen: Urine, Random   Result Value Ref Range Status   Specimen Description URINE, RANDOM  Final   Special Requests   Final    NONE Performed at [facility], [address] [place], [place] [address]    Culture MULTIPLE SPECIES PRESENT, SUGGEST RECOLLECTION (A)  Final   Report Status 07/29/2019 FINAL  Final  Respiratory Panel by RT PCR (Flu A&B, Covid) - Nasopharyngeal Swab     Status: None   Collection Time: 07/28/19  8:20 PM   Specimen: Nasopharyngeal Swab  Result Value Ref Range Status   SARS Coronavirus 2 by RT PCR NEGATIVE NEGATIVE Final    Comment: (NOTE) SARS-CoV-2 target nucleic acids are NOT DETECTED. The SARS-CoV-2 RNA is generally detectable in upper respiratoy specimens during the acute phase of infection. The lowest concentration of SARS-CoV-2 viral copies this assay can detect is 131 copies/mL. A negative result does not preclude SARS-Cov-2 infection and should not be used as the sole basis for treatment or other patient management decisions. A negative result may occur with  improper specimen collection/handling, submission of specimen other than nasopharyngeal swab, presence of viral mutation(s) within the areas targeted by this assay, and inadequate number of viral copies (<131 copies/mL). A negative result must be combined with clinical observations, patient history, and epidemiological information. The expected result is Negative. Fact Sheet for Patients:  PinkCheek.be Fact Sheet for Healthcare Providers:  GravelBags.it This test is not yet ap proved or cleared by the [place] FDA and  has been authorized for detection and/or diagnosis of SARS-CoV-2 by FDA under an Emergency Use Authorization (EUA). This EUA will remain  in effect (meaning this test can be used) for the duration of the COVID-19 declaration under Section 564(b)(1) of the Act, 21 U.S.C. section 360bbb-3(b)(1), unless the authorization is  terminated  or revoked sooner.    Influenza A by PCR NEGATIVE NEGATIVE Final   Influenza B by PCR NEGATIVE NEGATIVE Final    Comment: (NOTE) The Xpert Xpress SARS-CoV-2/FLU/RSV assay is intended as an aid in  the diagnosis of influenza from Nasopharyngeal swab specimens and  should not be used as a sole basis for treatment. Nasal washings and  aspirates are unacceptable for Xpert Xpress SARS-CoV-2/FLU/RSV  testing. Fact Sheet for Patients: PinkCheek.be Fact Sheet for Healthcare Providers: GravelBags.it This test is not yet approved or cleared by the [place] FDA and  has been authorized for detection and/or diagnosis of SARS-CoV-2 by  FDA under an Emergency Use Authorization (EUA). This EUA will remain  in effect (meaning this test can be used) for the duration of the  Covid-19 declaration under Section 564(b)(1) of the Act, 21  U.S.C. section 360bbb-3(b)(1), unless the authorization is  terminated or revoked. Performed at [facility], [place] [address], [place], [place] [address]   SARS CORONAVIRUS 2 (TAT 6-24 HRS) Nasopharyngeal Nasopharyngeal Swab     Status: None   Collection Time: 08/01/19 12:28 AM   Specimen: Nasopharyngeal Swab  Result Value Ref Range Status   SARS Coronavirus 2 NEGATIVE NEGATIVE Final    Comment: (NOTE) SARS-CoV-2 target nucleic acids are NOT DETECTED. The SARS-CoV-2 RNA is generally detectable in upper and lower respiratory specimens during the acute phase of infection. Negative results do not preclude SARS-CoV-2 infection, do not rule out co-infections with other pathogens, and should not be used as the sole basis for treatment or other patient management decisions. Negative results must be combined with clinical observations, patient history, and epidemiological information. The expected result is Negative. Fact Sheet for Patients: SugarRoll.be Fact Sheet for  Healthcare Providers: https://www.woods-mathews.com/ This test is not yet approved or cleared by the [place] FDA and  has been authorized for detection and/or diagnosis of SARS-CoV-2 by FDA under an Emergency Use Authorization (EUA). This EUA will remain  in effect (meaning this test can be used) for the duration of the COVID-19 declaration under Section 56 4(b)(1) of the Act, 21 U.S.C. section 360bbb-3(b)(1), unless the authorization is terminated or revoked sooner. Performed at [facility], [place] [address] [place], [place] [address]   Urine culture     Status: Abnormal   Collection Time: 08/03/19  4:01 AM   Specimen: Urine, Catheterized  Result Value Ref Range Status   Specimen Description URINE, CATHETERIZED  Final   Special Requests   Final    Normal Performed at [facility], [place] [address] [place], [place] [address]    Culture (A)  Final    >=100,000 COLONIES/mL PSEUDOMONAS AERUGINOSA >=100,000 COLONIES/mL ENTEROCOCCUS FAECALIS    Report Status 08/05/2019 FINAL  Final   Organism ID, Bacteria PSEUDOMONAS AERUGINOSA (A)  Final   Organism ID, Bacteria ENTEROCOCCUS FAECALIS (A)  Final      Susceptibility   Enterococcus faecalis - MIC*    AMPICILLIN <=2 SENSITIVE Sensitive     NITROFURANTOIN <=16 SENSITIVE Sensitive     VANCOMYCIN 1 SENSITIVE Sensitive     * >=100,000 COLONIES/mL ENTEROCOCCUS FAECALIS   Pseudomonas aeruginosa - MIC*    CEFTAZIDIME 4 SENSITIVE Sensitive     CIPROFLOXACIN <=0.25 SENSITIVE Sensitive     GENTAMICIN 2 SENSITIVE Sensitive     IMIPENEM 2 SENSITIVE Sensitive     PIP/TAZO 8 SENSITIVE Sensitive     CEFEPIME 4 SENSITIVE Sensitive     * >=100,000  COLONIES/mL PSEUDOMONAS AERUGINOSA  Blood culture (routine x 2)     Status: None (Preliminary result)   Collection Time: 08/03/19  5:44 AM   Specimen: BLOOD  Result Value Ref Range Status   Specimen Description BLOOD RIGHT ARM  Final   Special Requests   Final    BOTTLES  DRAWN AEROBIC AND ANAEROBIC Blood Culture adequate volume   Culture   Final    NO GROWTH 2 DAYS Performed at [facility], [address], [place], [place] [address]    Report Status PENDING  Incomplete  Respiratory Panel by RT PCR (Flu A&B, Covid) - Nasopharyngeal Swab     Status: None   Collection Time: 08/03/19  5:46 AM   Specimen: Nasopharyngeal Swab  Result Value Ref Range Status   SARS Coronavirus 2 by RT PCR NEGATIVE NEGATIVE Final    Comment: (NOTE) SARS-CoV-2 target nucleic acids are NOT DETECTED. The SARS-CoV-2 RNA is generally detectable in upper respiratoy specimens during the acute phase of infection. The lowest concentration of SARS-CoV-2 viral copies this assay can detect is 131 copies/mL. A negative result does not preclude SARS-Cov-2 infection and should not be used as the sole basis for treatment or other patient management decisions. A negative result may occur with  improper specimen collection/handling, submission of specimen other than nasopharyngeal swab, presence of viral mutation(s) within the areas targeted by this assay, and inadequate number of viral copies (<131 copies/mL). A negative result must be combined with clinical observations, patient history, and epidemiological information. The expected result is Negative. Fact Sheet for Patients:  PinkCheek.be Fact Sheet for Healthcare Providers:  GravelBags.it This test is not yet ap proved or cleared by the [place] FDA and  has been authorized for detection and/or diagnosis of SARS-CoV-2 by FDA under an Emergency Use Authorization (EUA). This EUA will remain  in effect (meaning this test can be used) for the duration of the COVID-19 declaration under Section 564(b)(1) of the Act, 21 U.S.C. section 360bbb-3(b)(1), unless the authorization is terminated or revoked sooner.    Influenza A by PCR NEGATIVE NEGATIVE Final   Influenza B by PCR  NEGATIVE NEGATIVE Final    Comment: (NOTE) The Xpert Xpress SARS-CoV-2/FLU/RSV assay is intended as an aid in  the diagnosis of influenza from Nasopharyngeal swab specimens and  should not be used as a sole basis for treatment. Nasal washings and  aspirates are unacceptable for Xpert Xpress SARS-CoV-2/FLU/RSV  testing. Fact Sheet for Patients: PinkCheek.be Fact Sheet for Healthcare Providers: GravelBags.it This test is not yet approved or cleared by the [place] FDA and  has been authorized for detection and/or diagnosis of SARS-CoV-2 by  FDA under an Emergency Use Authorization (EUA). This EUA will remain  in effect (meaning this test can be used) for the duration of the  Covid-19 declaration under Section 564(b)(1) of the Act, 21  U.S.C. section 360bbb-3(b)(1), unless the authorization is  terminated or revoked. Performed at [facility], [place] [address] [place], [place] [address]   Blood culture (routine x 2)     Status: Abnormal   Collection Time: 08/03/19  7:49 AM   Specimen: BLOOD RIGHT ARM  Result Value Ref Range Status   Specimen Description BLOOD RIGHT ARM  Final   Special Requests   Final    BOTTLES DRAWN AEROBIC AND ANAEROBIC Blood Culture adequate volume   Culture  Setup Time (A)  Final    GRAM VARIABLE ROD ANAEROBIC BOTTLE ONLY Organism ID to  follow NO ORGANISM DETECTED CRITICAL RESULT CALLED TO, READ BACK BY AND VERIFIED WITH: PHARMD JAMES LEDFORD AT 2305 BY [place] ON 08/03/2019 Performed at [place] [address], [place], [place] [address]    Culture CLOSTRIDIUM PERFRINGENS (A)  Final   Report Status 08/05/2019 FINAL  Final  Blood Culture ID Panel (Reflexed)     Status: None   Collection Time: 08/03/19  7:49 AM  Result Value Ref Range Status   Enterococcus species NOT DETECTED NOT DETECTED Final   Listeria monocytogenes NOT DETECTED NOT DETECTED Final   Staphylococcus  species NOT DETECTED NOT DETECTED Final   Staphylococcus aureus (BCID) NOT DETECTED NOT DETECTED Final   Streptococcus species NOT DETECTED NOT DETECTED Final   Streptococcus agalactiae NOT DETECTED NOT DETECTED Final   Streptococcus pneumoniae NOT DETECTED NOT DETECTED Final   Streptococcus pyogenes NOT DETECTED NOT DETECTED Final   Acinetobacter baumannii NOT DETECTED NOT DETECTED Final   Enterobacteriaceae species NOT DETECTED NOT DETECTED Final   Enterobacter cloacae complex NOT DETECTED NOT DETECTED Final   Escherichia coli NOT DETECTED NOT DETECTED Final   Klebsiella oxytoca NOT DETECTED NOT DETECTED Final   Klebsiella pneumoniae NOT DETECTED NOT DETECTED Final   Proteus species NOT DETECTED NOT DETECTED Final   Serratia marcescens NOT DETECTED NOT DETECTED Final   Haemophilus influenzae NOT DETECTED NOT DETECTED Final   Neisseria meningitidis NOT DETECTED NOT DETECTED Final   Pseudomonas aeruginosa NOT DETECTED NOT DETECTED Final   Candida albicans NOT DETECTED NOT DETECTED Final   Candida glabrata NOT DETECTED NOT DETECTED Final   Candida krusei NOT DETECTED NOT DETECTED Final   Candida parapsilosis NOT DETECTED NOT DETECTED Final   Candida tropicalis NOT DETECTED NOT DETECTED Final    Comment: Performed at [facility], [place] [address] [place], [place] [address]  MRSA PCR Screening     Status: None   Collection Time: 08/03/19  5:46 PM   Specimen: Nasopharyngeal  Result Value Ref Range Status   MRSA by PCR NEGATIVE NEGATIVE Final    Comment:        The GeneXpert MRSA Assay (FDA approved for NASAL specimens only), is one component of a comprehensive MRSA colonization surveillance program. It is not intended to diagnose MRSA infection nor to guide or monitor treatment for MRSA infections. Performed at [facility], [place] [address] [place], [place] [address]      Time coordinating discharge: Over 30 minutes  SIGNED:   Little Ishikawa, DO [facility] 08/05/2019, 9:47 AM Pager   If 7PM-7AM, please contact night-coverage [email or web address]

## 2019-08-06 ENCOUNTER — Other Ambulatory Visit: Payer: Medicare Other

## 2019-08-08 LAB — CULTURE, BLOOD (ROUTINE X 2)
Culture: NO GROWTH
Special Requests: ADEQUATE

## 2019-08-13 DIAGNOSIS — J069 Acute upper respiratory infection, unspecified: Secondary | ICD-10-CM | POA: Diagnosis not present

## 2019-08-18 ENCOUNTER — Other Ambulatory Visit: Payer: Self-pay | Admitting: *Deleted

## 2019-08-18 NOTE — Patient Outreach (Signed)
[facility] Post- Acute Care Coordinator follow up  Spoke with [name] about conversation with daughter. Writer informed that member is more appropriate for ALF not long term care.   Telephone call back to [name] daughter/DPR. Patient identifiers confirmed.   Explained to [name] information received by facility SW. [name] states she understands but thinks member still needs someone with him 24/7. States there are no other family members in the area that can assist and that paying out of pocket will be too costly.   [name] has not applied for Medicaid but has been told in the past that member makes too much. Member receives 1350.00 per month. Provided DSS telephone number to call to see if there are any other options. Also provided [employer] telephone number to call to inquire.   Will follow up with [name] about transition plans.   Will continue to follow while member resides in SNF.    Trevor Rolling, MSN-Ed, RN,BSN [place] Acute Care Coordinator [phone] [facility]) [phone]  (Toll free office)

## 2019-08-18 NOTE — Patient Outreach (Signed)
Member screened for potential [facility] Care Management needs as a benefit of [employer].  [name] is receiving skilled therapy at [facility].   Telephone call made to his daughter/DPR [name]. Patient identifiers confirmed.   [name] reports member has lived with her and her husband since Nov. 2011. However, states it is no longer safe since they work 10 hour days. States member can not be left alone by himself and has frequent falls.   [name] states she has health concerns herself. States she in undergoing radiation treatment and has appointments. Reports member has 3 other children. States she was hopeful that member would have gone to stay with her brother in [place].   States she is interested in member staying at the facility for long term. States "he does not need to come home. It's not safe for him." Discussed the need to apply for facility Medicaid. Will need to speak with business office at [employer] [name] plans to discuss further with [employer] SW.  [name] expressed appreciation of writer's call. Provided writer's contact information.  Will continue to follow for transition plans.   Will communicate with facility [place], MSN-Ed, RN,BSN [place] Acute Care Coordinator [phone] [facility]) [phone]  (Toll free office)

## 2019-08-23 ENCOUNTER — Other Ambulatory Visit: Payer: Self-pay | Admitting: Family Medicine

## 2019-08-23 DIAGNOSIS — E1142 Type 2 diabetes mellitus with diabetic polyneuropathy: Secondary | ICD-10-CM

## 2019-08-26 DIAGNOSIS — I69398 Other sequelae of cerebral infarction: Secondary | ICD-10-CM | POA: Diagnosis not present

## 2019-08-26 DIAGNOSIS — I4892 Unspecified atrial flutter: Secondary | ICD-10-CM | POA: Diagnosis not present

## 2019-08-26 DIAGNOSIS — M6281 Muscle weakness (generalized): Secondary | ICD-10-CM | POA: Diagnosis not present

## 2019-08-26 DIAGNOSIS — R2681 Unsteadiness on feet: Secondary | ICD-10-CM | POA: Diagnosis not present

## 2019-08-26 DIAGNOSIS — N390 Urinary tract infection, site not specified: Secondary | ICD-10-CM | POA: Diagnosis not present

## 2019-08-26 DIAGNOSIS — D509 Iron deficiency anemia, unspecified: Secondary | ICD-10-CM | POA: Diagnosis not present

## 2019-08-26 DIAGNOSIS — J449 Chronic obstructive pulmonary disease, unspecified: Secondary | ICD-10-CM | POA: Diagnosis not present

## 2019-08-26 DIAGNOSIS — I69828 Other speech and language deficits following other cerebrovascular disease: Secondary | ICD-10-CM | POA: Diagnosis not present

## 2019-08-26 DIAGNOSIS — R1312 Dysphagia, oropharyngeal phase: Secondary | ICD-10-CM | POA: Diagnosis not present

## 2019-08-26 DIAGNOSIS — R41841 Cognitive communication deficit: Secondary | ICD-10-CM | POA: Diagnosis not present

## 2019-08-26 DIAGNOSIS — Z9181 History of falling: Secondary | ICD-10-CM | POA: Diagnosis not present

## 2019-08-26 DIAGNOSIS — I4819 Other persistent atrial fibrillation: Secondary | ICD-10-CM | POA: Diagnosis not present

## 2019-08-26 DIAGNOSIS — J189 Pneumonia, unspecified organism: Secondary | ICD-10-CM | POA: Diagnosis not present

## 2019-08-26 DIAGNOSIS — I10 Essential (primary) hypertension: Secondary | ICD-10-CM | POA: Diagnosis not present

## 2019-08-26 DIAGNOSIS — B9689 Other specified bacterial agents as the cause of diseases classified elsewhere: Secondary | ICD-10-CM | POA: Diagnosis not present

## 2019-08-26 DIAGNOSIS — I5032 Chronic diastolic (congestive) heart failure: Secondary | ICD-10-CM | POA: Diagnosis not present

## 2019-08-26 DIAGNOSIS — I25118 Atherosclerotic heart disease of native coronary artery with other forms of angina pectoris: Secondary | ICD-10-CM | POA: Diagnosis not present

## 2019-08-26 DIAGNOSIS — I69328 Other speech and language deficits following cerebral infarction: Secondary | ICD-10-CM | POA: Diagnosis not present

## 2019-08-27 ENCOUNTER — Other Ambulatory Visit: Payer: Self-pay | Admitting: *Deleted

## 2019-08-27 NOTE — Patient Outreach (Signed)
Screened for potential [facility] Care Management needs as a benefit of  [employer].  Member is receiving skilled therapy at [facility].  Writer attended telephonic interdisciplinary team meeting to assess for disposition needs and transition plan for resident.   Facility reports member likely to transition home next week. Will follow up with facility SW regarding disposition plans.  Marthenia Rolling, MSN-Ed, RN,BSN [place] Acute Care Coordinator [phone] [facility]) [phone]  (Toll free office)

## 2019-08-27 NOTE — Patient Outreach (Signed)
[facility] Post- Acute Care Coordinator follow up  [name] is currently receiving skilled therapy at [facility].   Telephone call made to daughter/DPR Dimas Millin [phone]. Patient identifiers confirmed. [name] states she did not follow thru with the Medicaid application. States she was not able to provide the 2 years worth of medical bills and the like. Reports member "cannot come back home, my husband will not allow it." She did not have any other suggestions. States facility is recommending ALF and not LTC.   Writer will follow up for additional details during facility IDT meeting today.   Marthenia Rolling, MSN-Ed, RN,BSN [place] Acute Care Coordinator [phone] [facility]) [phone]  (Toll free office)

## 2019-09-02 ENCOUNTER — Encounter: Payer: Medicare Other | Admitting: Cardiology

## 2019-09-02 NOTE — Progress Notes (Deleted)
Electrophysiology Office Note   Date:  09/02/2019   ID:  Calyb Mcquarrie, DOB June 16, 1940, MRN [id number]  PCP:  Nicolette Bang, DO  Cardiologist:  [name] Primary Electrophysiologist:  Renny Gunnarson Meredith Leeds, MD    No chief complaint on file.    History of Present Illness: Haji Delaine is a 79 y.o. male who is being seen today for the evaluation of atrial flutter at the request of Rosaria Ferries. Presenting today for electrophysiology evaluation.  He has a history of diastolic heart failure, COPD, diabetes, hypertension, hyperlipidemia, paroxysmal atrial fibrillation and atrial flutter status post ablation, GI bleeding not anticoagulated.  He had a catheterization 10/05/2017 with nonobstructive coronary disease.  He has had multiple emergency room visits for atrial fibrillation.  He is tolerating his 240 mg of diltiazem without major issue.  He has had a few episodes where he has tachycardia and has to take his 30 mg dose.  His main symptoms are palpitations, weakness, and fatigue.  He continues to smoke up to 4 cigarettes a day.  He had an episode of syncope and is now status post Medtronic dual-chamber pacemaker implanted 10/21/2018.   Today, denies symptoms of palpitations, chest pain, shortness of breath, orthopnea, PND, lower extremity edema, claudication, dizziness, presyncope, syncope, bleeding, or neurologic sequela. The patient is tolerating medications without difficulties. ***   Past Medical History:  Diagnosis Date  . Allergic rhinitis   . Anemia    takes Ferrous Sulfate daily  . Arthritis    "all over"  . Atrial flutter ([place])   . Balance problem 01/2014  . CAP (community acquired pneumonia) 09/18/2014  . Cervical radiculopathy due to degenerative joint disease of spine   . Chronic coronary artery disease    a. 03/2010 Nonocclusive disease by cath, performed for ST elevations on ECG;  b. 06/2013 Lexi MV: EF 60%, no ischemia.  [name] COPD (chronic obstructive pulmonary  disease) ([place])   . Diabetes mellitus type II    takes Metformin and Lantus daily  . Diastolic CHF, chronic ([place])    a. 12/2012 EF 55-60%, diast dysfxn, triv MR, mildly dil LA/RA.  [name] Dyspnea    with pain  . Essential hypertension    takes Diltiazem daily  . Frail elderly   . GERD (gastroesophageal reflux disease)   . History of blood transfusion 1982   "when I had stomach OR"  . History of bronchitis    1998  . History of CVA (cerebrovascular accident)   . History of gastric ulcer   . History of kidney stones   . Hypercholesterolemia    takes Pravastatin daily  . Intrinsic eczema   . Legally blind in left eye, as defined in [place]   . Obesity   . PAF (paroxysmal atrial fibrillation) (H[facility])    Recurrent after atrial flutter (a. 07/2010 Status post caval tricuspid isthmus ablation by Dr. Midge Aver Metoprolol daily), currently controlled on flecainide plus diltiazem and Coumadin  . Urinary retention   . Vitamin D deficiency   . Weakness    numbness and tingling both hands   Past Surgical History:  Procedure Laterality Date  . ATRIAL ABLATION SURGERY  08/05/10   CTI ablation for atrial flutter by JA  . CARDIAC CATHETERIZATION  2012   nl LV function, no occlusive CAD, PAF  . CARDIOVERSION  12/07/2010    Successful direct current cardioversion with atrial fibrillation to normal sinus rhythm  . CARPAL TUNNEL RELEASE Bilateral 01/30/2014   Procedure: BILATERAL CARPAL TUNNEL RELEASE;  Surgeon: Marianna Payment, MD;  Location: [place];  Service: Orthopedics;  Laterality: Bilateral;  . CATARACT EXTRACTION W/ INTRAOCULAR LENS  IMPLANT, BILATERAL Bilateral   . COLONOSCOPY [name] 12/02/2013   Procedure: COLONOSCOPY;  Surgeon: Irene Shipper, MD;  Location: [place];  Service: Endoscopy;  Laterality: [name];  . ESOPHAGOGASTRODUODENOSCOPY [name] 09/22/2014   Procedure: ESOPHAGOGASTRODUODENOSCOPY (EGD);  Surgeon: Ronald Lobo, MD;  Location: [facility] ENDOSCOPY;  Service: Endoscopy;  Laterality: [name];  .  ESOPHAGOGASTRODUODENOSCOPY [name] 07/11/2016   Procedure: ESOPHAGOGASTRODUODENOSCOPY (EGD);  Surgeon: Doran Stabler, MD;  Location: [facility] ENDOSCOPY;  Service: Endoscopy;  Laterality: [name];  . EXTRACORPOREAL SHOCK WAVE LITHOTRIPSY Right 07/15/2018   Procedure: EXTRACORPOREAL SHOCK WAVE LITHOTRIPSY (ESWL);  Surgeon: Lucas Mallow, MD;  Location: [facility];  Service: Urology;  Laterality: Right;  . INCISION AND DRAINAGE ABSCESS / HEMATOMA OF BURSA / KNEE / THIGH Left 1998   knee  . KNEE BURSECTOMY Left 1998  . LAPAROSCOPIC CHOLECYSTECTOMY  03/2010  . LEFT HEART CATH AND CORONARY ANGIOGRAPHY [name] 10/05/2017   Procedure: LEFT HEART CATH AND CORONARY ANGIOGRAPHY;  Surgeon: Martinique, Peter M, MD;  Location: [place] CV LAB;  Service: Cardiovascular;  Laterality: [name];  . NM MYOVIEW LTD  07/22/2013   Normal EF ~60%, no ischemia or infarction.  [name] PACEMAKER IMPLANT [name] 10/21/2018   Procedure: PACEMAKER IMPLANT;  Surgeon: Deboraha Sprang, MD;  Location: [place] CV [facility];  Service: Cardiovascular;  Laterality: [name];  . PARTIAL GASTRECTOMY  1982   subtotal; "took out 30% for ulcers"  . TRANSTHORACIC ECHOCARDIOGRAM  02/16/2014   EF 60%, no RWMA. - otherwise normal  . YAG LASER APPLICATION Bilateral      Current Outpatient Medications  Medication Sig Dispense Refill  . Accu-Chek FastClix Lancets MISC USE TO TEST BLOOD SUGAR TWICE DAILY. E11.00 102 each 0  . aspirin EC 81 MG tablet Take 81 mg by mouth daily.    . citalopram (CELEXA) 20 MG tablet Take 1 tablet (20 mg total) by mouth daily. 90 tablet 1  . diltiazem (CARDIZEM CD) 120 MG 24 hr capsule Take 1 capsule (120 mg total) by mouth 2 (two) times daily. 60 capsule 9  . ferrous sulfate 325 (65 FE) MG tablet Take 325 mg by mouth daily with breakfast.    . gabapentin (NEURONTIN) 100 MG capsule Take 1 capsule (100 mg total) by mouth at bedtime. 90 capsule 3  . glimepiride (AMARYL) 2 MG tablet Take 0.5 tablets (1 mg total) by mouth daily before breakfast. 90  tablet 1  . glucose blood (ACCU-CHEK GUIDE) test strip USE TO TEST BLOOD SUGAR TWICE DAILY. E11.00 100 each 0  . nitroGLYCERIN (NITROSTAT) 0.4 MG SL tablet Place 1 tablet (0.4 mg total) under the tongue every 5 (five) minutes x 3 doses as needed for chest pain. 25 tablet 9  . pantoprazole (PROTONIX) 40 MG tablet Take 1 tablet (40 mg total) by mouth 2 (two) times daily before a meal. Dx: K21.9 180 tablet 0  . pravastatin (PRAVACHOL) 40 MG tablet Take 1 tablet (40 mg total) by mouth every evening. 90 tablet 0  . senna-docusate (SENOKOT-S) 8.6-50 MG tablet Take 1 tablet by mouth 2 (two) times daily as needed for mild constipation or moderate constipation. 30 tablet 0  . tamsulosin (FLOMAX) 0.4 MG CAPS capsule Take 1 capsule (0.4 mg total) by mouth daily. 30 capsule 3   No current facility-administered medications for this visit.    Allergies:   Cyclobenzaprine and Other  Social History:  The patient  reports that he has quit smoking. His smoking use included cigarettes. He has a 14.25 pack-year smoking history. He has never used smokeless tobacco. He reports that he does not drink alcohol or use drugs.   Family History:  The patient's family history includes Breast cancer in his mother; Diabetes in his mother; Heart attack in his brother and father; Heart disease in his father; Kidney disease in his mother; Leukemia in his daughter.   ROS:  Please see the history of present illness.   Otherwise, review of systems is positive for none.   All other systems are reviewed and negative.   PHYSICAL EXAM: VS:  There were no vitals taken for this visit. , BMI There is no height or weight on file to calculate BMI. GEN: Well nourished, well developed, in no acute distress  HEENT: normal  Neck: no JVD, carotid bruits, or masses Cardiac: ***RRR; no murmurs, rubs, or gallops,no edema  Respiratory:  clear to auscultation bilaterally, normal work of breathing GI: soft, nontender, nondistended, + BS MS: no  deformity or atrophy  Skin: warm and dry, device site well healed Neuro:  Strength and sensation are intact Psych: euthymic mood, full affect  EKG:  EKG {ACTION; IS/IS QMG:[id number]} ordered today. Personal review of the ekg ordered *** shows ***  Personal review of the device interrogation today. Results in [place]: 10/20/2018: TSH 1.468 08/03/2019: ALT 34 08/05/2019: BUN 14; Creatinine, Ser 1.22; Hemoglobin 8.9; Magnesium 2.4; Platelets 136; Potassium 3.7; Sodium 135    Lipid Panel     Component Value Date/Time   CHOL 135 10/02/2018 0933   TRIG 94 10/02/2018 0933   HDL 47 10/02/2018 0933   CHOLHDL 2.9 10/02/2018 0933   CHOLHDL 2.2 11/09/2017 0040   VLDL 27 11/09/2017 0040   LDLCALC 69 10/02/2018 0933     Wt Readings from Last 3 Encounters:  08/05/19 174 lb 9.6 oz (79.2 kg)  08/05/19 174 lb 9.7 oz (79.2 kg)  08/01/19 166 lb 1.6 oz (75.3 kg)      Other studies Reviewed: Additional studies/ records that were reviewed today include: LHC 10/05/17  Review of the above records today demonstrates:   Dist RCA lesion is 40% stenosed.  Ost 1st Sept lesion is 95% stenosed.  LV end diastolic pressure is normal.   1. Single vessel CAD involving the ostium of the first septal perforator. Otherwise nonobstructive disease. 2. Normal LVEDP  TTE 10/05/17 - Left ventricle: The cavity size was normal. Wall thickness was   increased in a pattern of mild LVH. Systolic function was normal.   The estimated ejection fraction was in the range of 55% to 60%. - Aortic valve: There was trivial regurgitation. - Mitral valve: There was mild regurgitation. - Left atrium: The atrium was mildly dilated.  ASSESSMENT AND PLAN:  1.  Atypical atrial flutter/atrial fibrillation: Has had recurrent GI bleeds and thus is not anticoagulated.  Status post Medtronic pacemaker.  ***  2.  Chronic diastolic heart failure: No signs of volume overload.  No changes.  3.  Tobacco abuse: Cessation  encouraged  4.  Tachybradycardia syndrome: Status post Medtronic dual-chamber pacemaker.  Device functioning appropriately.  No changes.  Current medicines are reviewed at length with the patient today.   The patient does not have concerns regarding his medicines.  The following changes were made today: *** Labs/ tests ordered today include:  No orders of the defined types were placed in this encounter.  Disposition:   FU with Damontae Loppnow *** months  Signed, Gerrad Welker Meredith Leeds, MD  09/02/2019 9:10 AM     [facility] [address] [place] [address] [phone] (office) [phone] (fax)

## 2019-09-08 ENCOUNTER — Other Ambulatory Visit: Payer: Self-pay | Admitting: *Deleted

## 2019-09-08 NOTE — Patient Outreach (Signed)
[facility] Post- Acute Care Coordinator follow up  Verified with [place] that [name] will remain in facility with intent of staying long term. Applying for Medicaid.  No identifiable [facility] Care Management needs at this time.    Marthenia Rolling, MSN-Ed, RN,BSN [place] Acute Care Coordinator [phone] [facility]) [phone]  (Toll free office)

## 2019-09-18 ENCOUNTER — Other Ambulatory Visit: Payer: Self-pay | Admitting: Family Medicine

## 2019-09-18 DIAGNOSIS — E1142 Type 2 diabetes mellitus with diabetic polyneuropathy: Secondary | ICD-10-CM

## 2019-09-20 IMAGING — CT CT ABDOMEN AND PELVIS WITH CONTRAST
2 of 5 series · 16 of 46 positions shown, 18 images · IV contrast (APPLIED)
Comparison: 07/27/2017

CLINICAL DATA: Nausea and chest pain

EXAM:
CT ABDOMEN AND PELVIS WITH CONTRAST
TECHNIQUE: Multidetector CT imaging of the abdomen and pelvis was performed
using the standard protocol following bolus administration of
intravenous contrast.
CONTRAST:  100mL OMNIPAQUE 300

[Series 3: abdomen 5.0 · axial · 0.90mm/px · z∈[+869,+1264]mm · 13 of 93 slices shown, 15 images]
[im 7/93  soft-tissue]
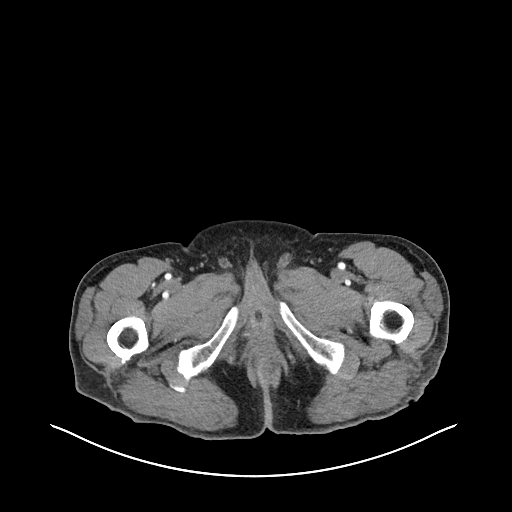
[im 7/93  bone]
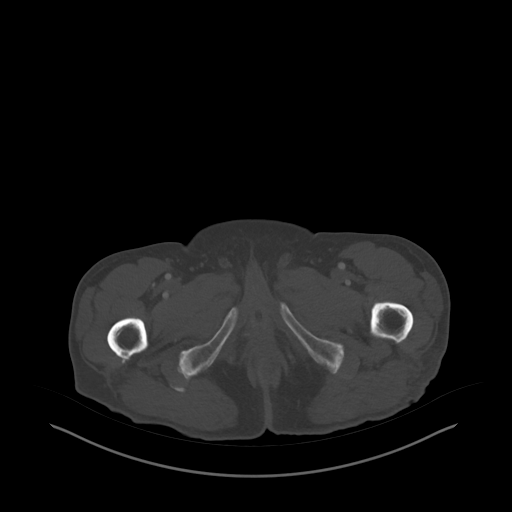
[im 14/93  soft-tissue]
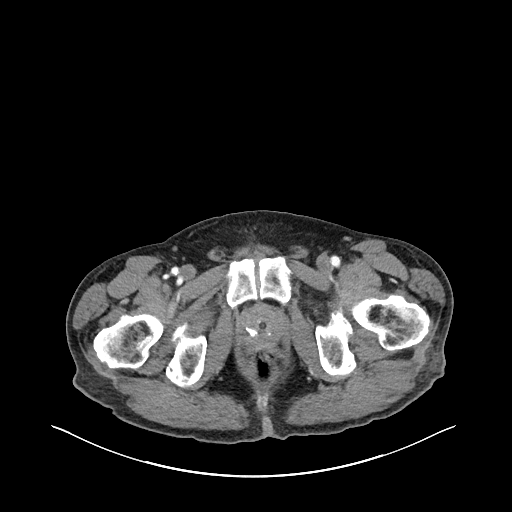
[im 20/93  soft-tissue]
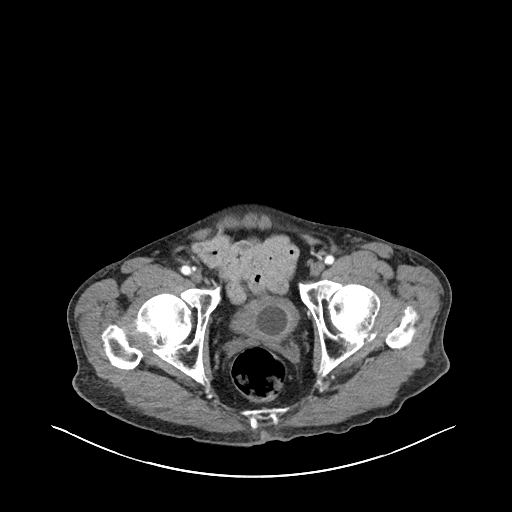
[im 27/93  soft-tissue]
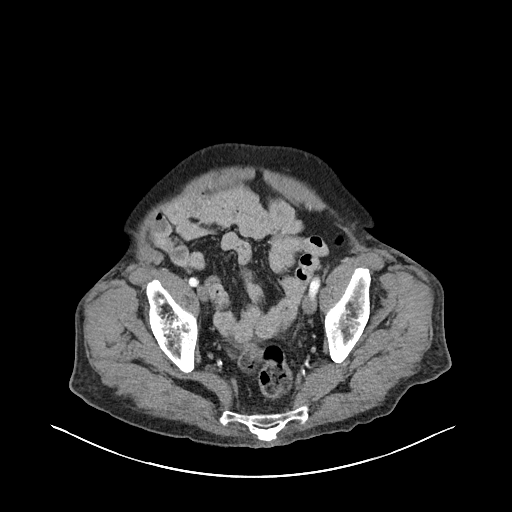
[im 33/93  soft-tissue]
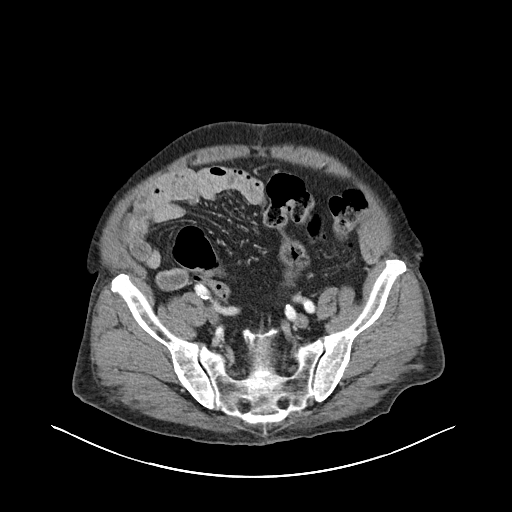
[im 40/93  soft-tissue]
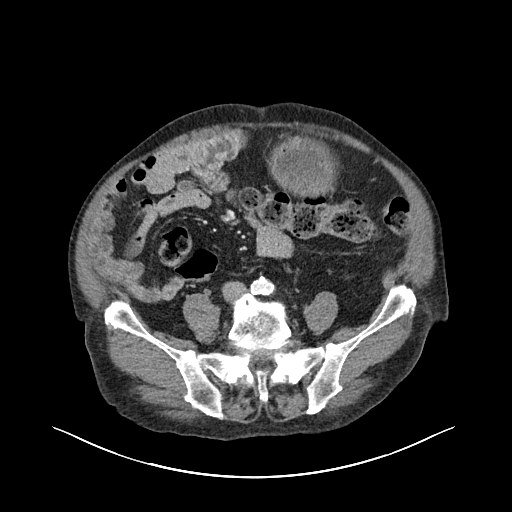
[im 47/93  soft-tissue]
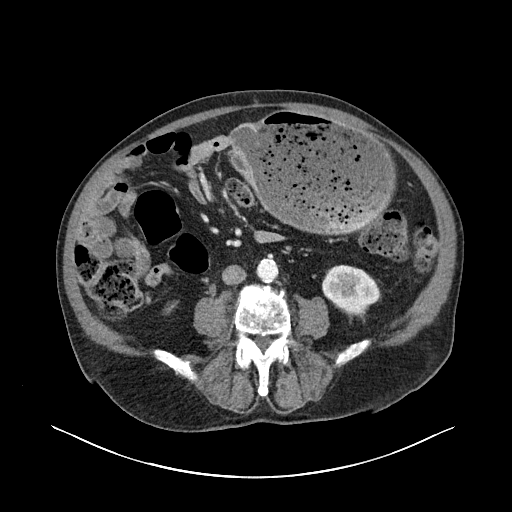
[im 53/93  soft-tissue]
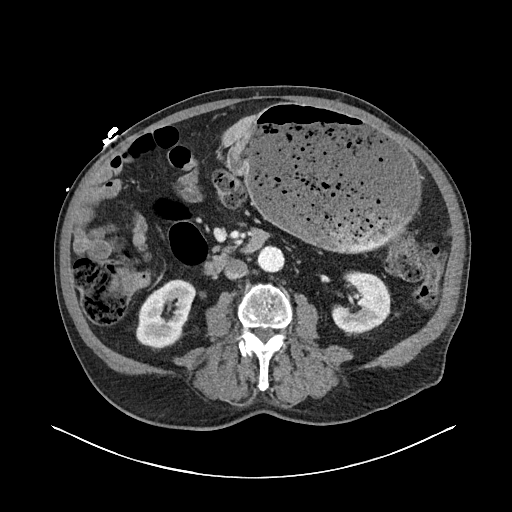
[im 60/93  soft-tissue]
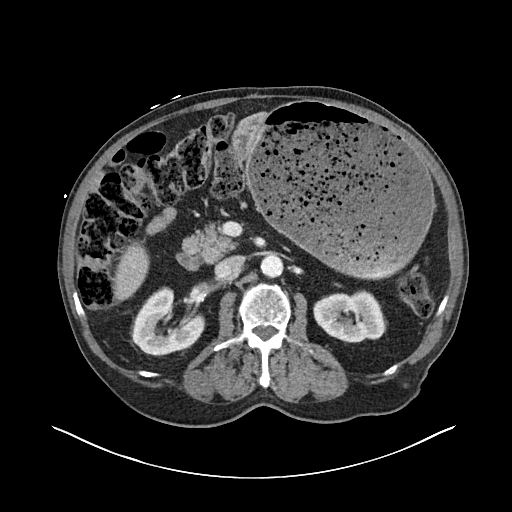
[im 60/93  bone]
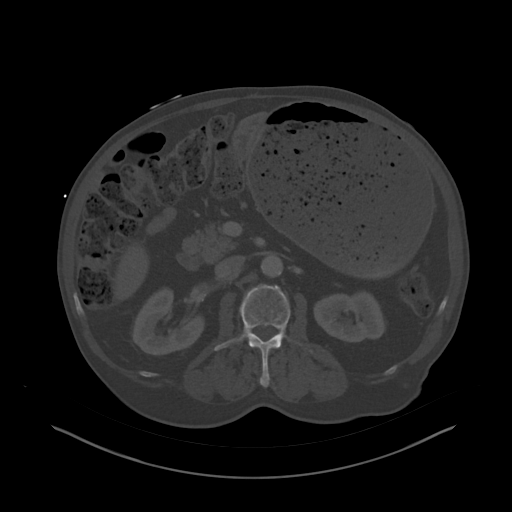
[im 66/93  soft-tissue]
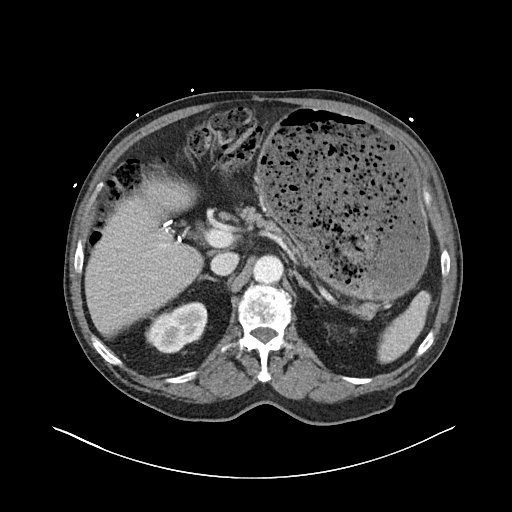
[im 73/93  soft-tissue]
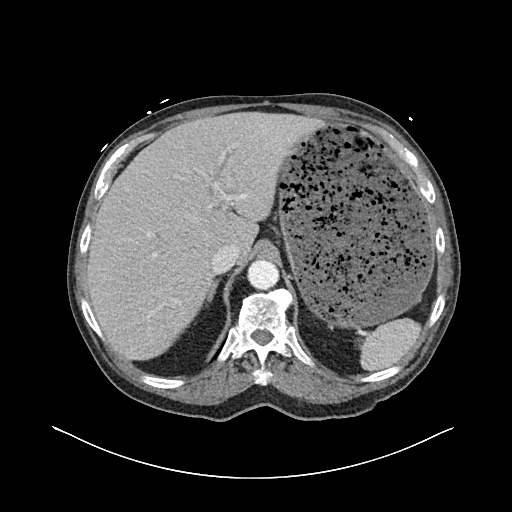
[im 79/93  soft-tissue]
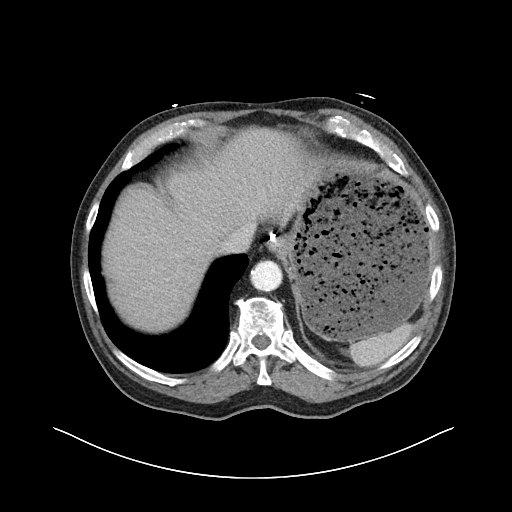
[im 86/93  soft-tissue]
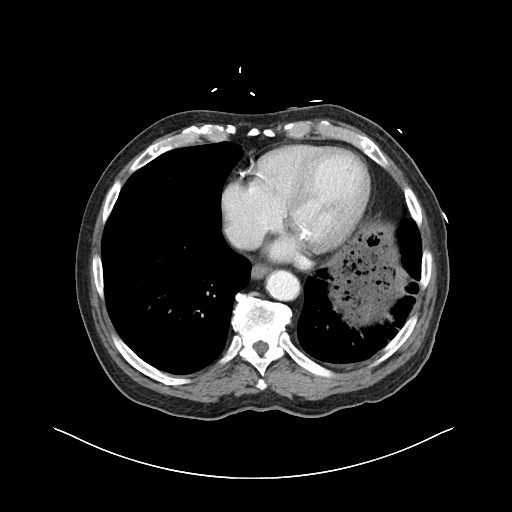

[Series 6: abdomen 3.0 mpr cor · coronal · 0.89mm/px · 3 of 112 slices shown]
[im 38/112  soft-tissue]
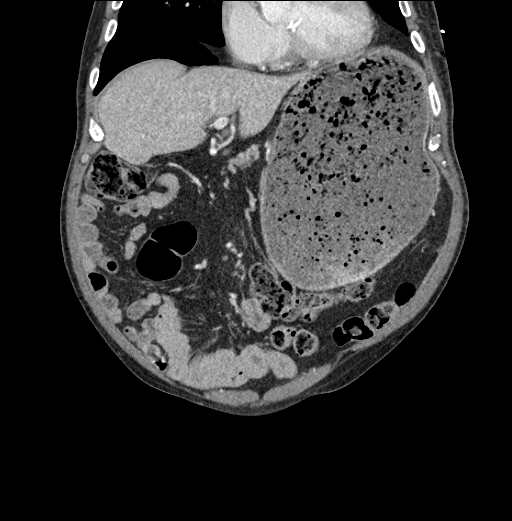
[im 50/112  soft-tissue]
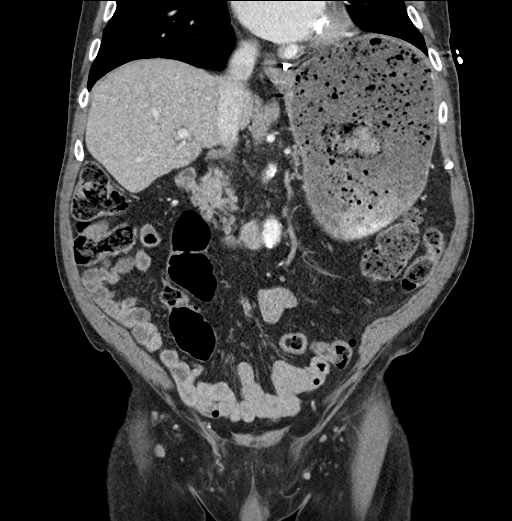
[im 62/112  soft-tissue]
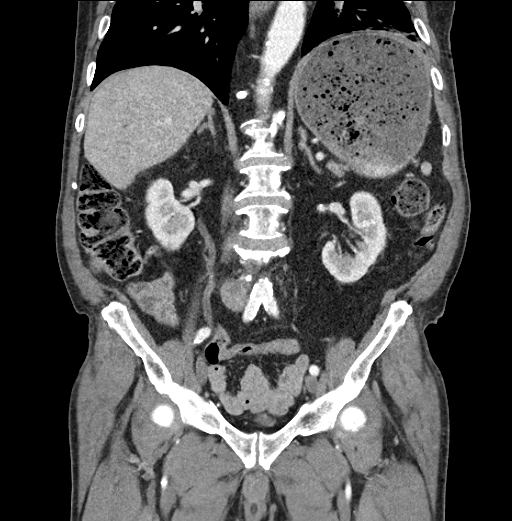

[16 of 46 positions shown; findings below may reference images not displayed]

FINDINGS: Lower chest: Lung bases are free of acute infiltrate or sizable
effusion. Mild left basilar atelectasis is seen.

Hepatobiliary: No focal liver abnormality is seen. Status post
cholecystectomy. No biliary dilatation.

Pancreas: Unremarkable. No pancreatic ductal dilatation or
surrounding inflammatory changes.

Spleen: Normal in size without focal abnormality.

Adrenals/Urinary Tract: Adrenal glands are within normal limits.
Kidneys are well visualized bilaterally. Left kidney demonstrates no
renal calculi or obstructive change. The bladder is decompressed by
Foley catheter. The right kidney demonstrates no renal calculi
although minimal fullness of the collecting system is noted
secondary to a 11 mm proximal right ureteral stone. This has
migrated from the right kidney seen on the prior exam. The more
distal right ureter is within normal limits.

Stomach/Bowel: Scattered diverticular change of the colon is noted.
No diverticulitis is seen. The appendix is within normal limits.
Postsurgical changes are noted in the stomach with evidence of a
gastrojejunostomy. The stomach is significantly distended with food
stuffs slightly increased when compared with the prior exam. The
anastomosis appears widely patent with the small bowel although some
adhesions may contribute to the degree of dilatation in the stomach.

Vascular/Lymphatic: Aortic atherosclerosis. No enlarged abdominal or
pelvic lymph nodes.

Reproductive: Prostate is unremarkable.

Other: No abdominal wall hernia or abnormality. No abdominopelvic
ascites.

Musculoskeletal: Degenerative changes of lumbar spine are noted. No
acute bony abnormality is seen.
IMPRESSION: 11 mm proximal right ureteral stone with mild hydronephrosis. This
stone has migrated from the right kidney seen on the prior exam.

Changes of gastrojejunostomy with significant distention of the
stomach with ingested food stuffs. The anastomosis appears patent
although the jejunal loop superior intimately apposed to the stomach
which may be related to a degree of adhesions. Clinical correlation
is recommended.

## 2019-09-20 IMAGING — CR CHEST - 2 VIEW
2 series · 2 of 2 positions shown · non-contrast
Comparison: 04/28/2018, 04/14/2018

CLINICAL DATA: 78-year-old male with a history of chest pain and
shortness of breath

EXAM:
CHEST - 2 VIEW

[chest pa]
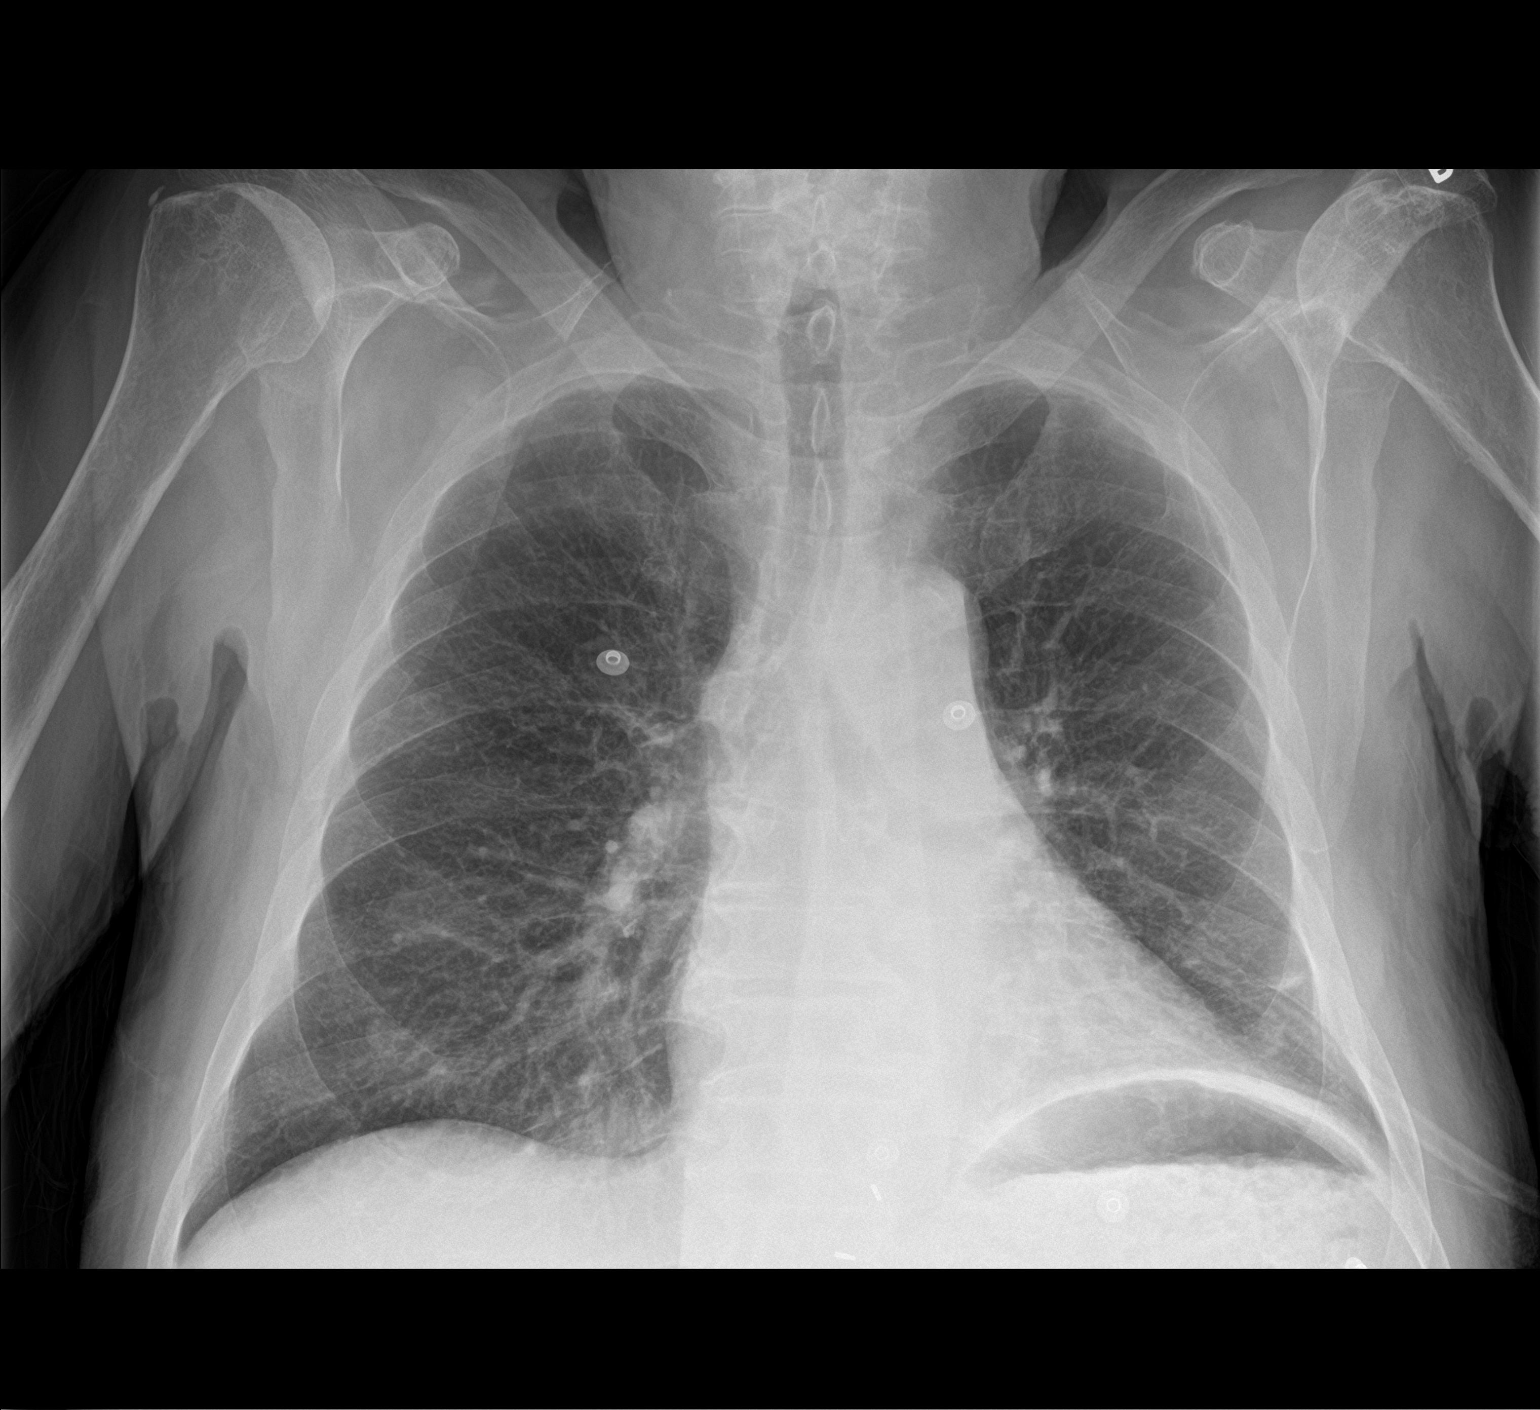

[chest lat]
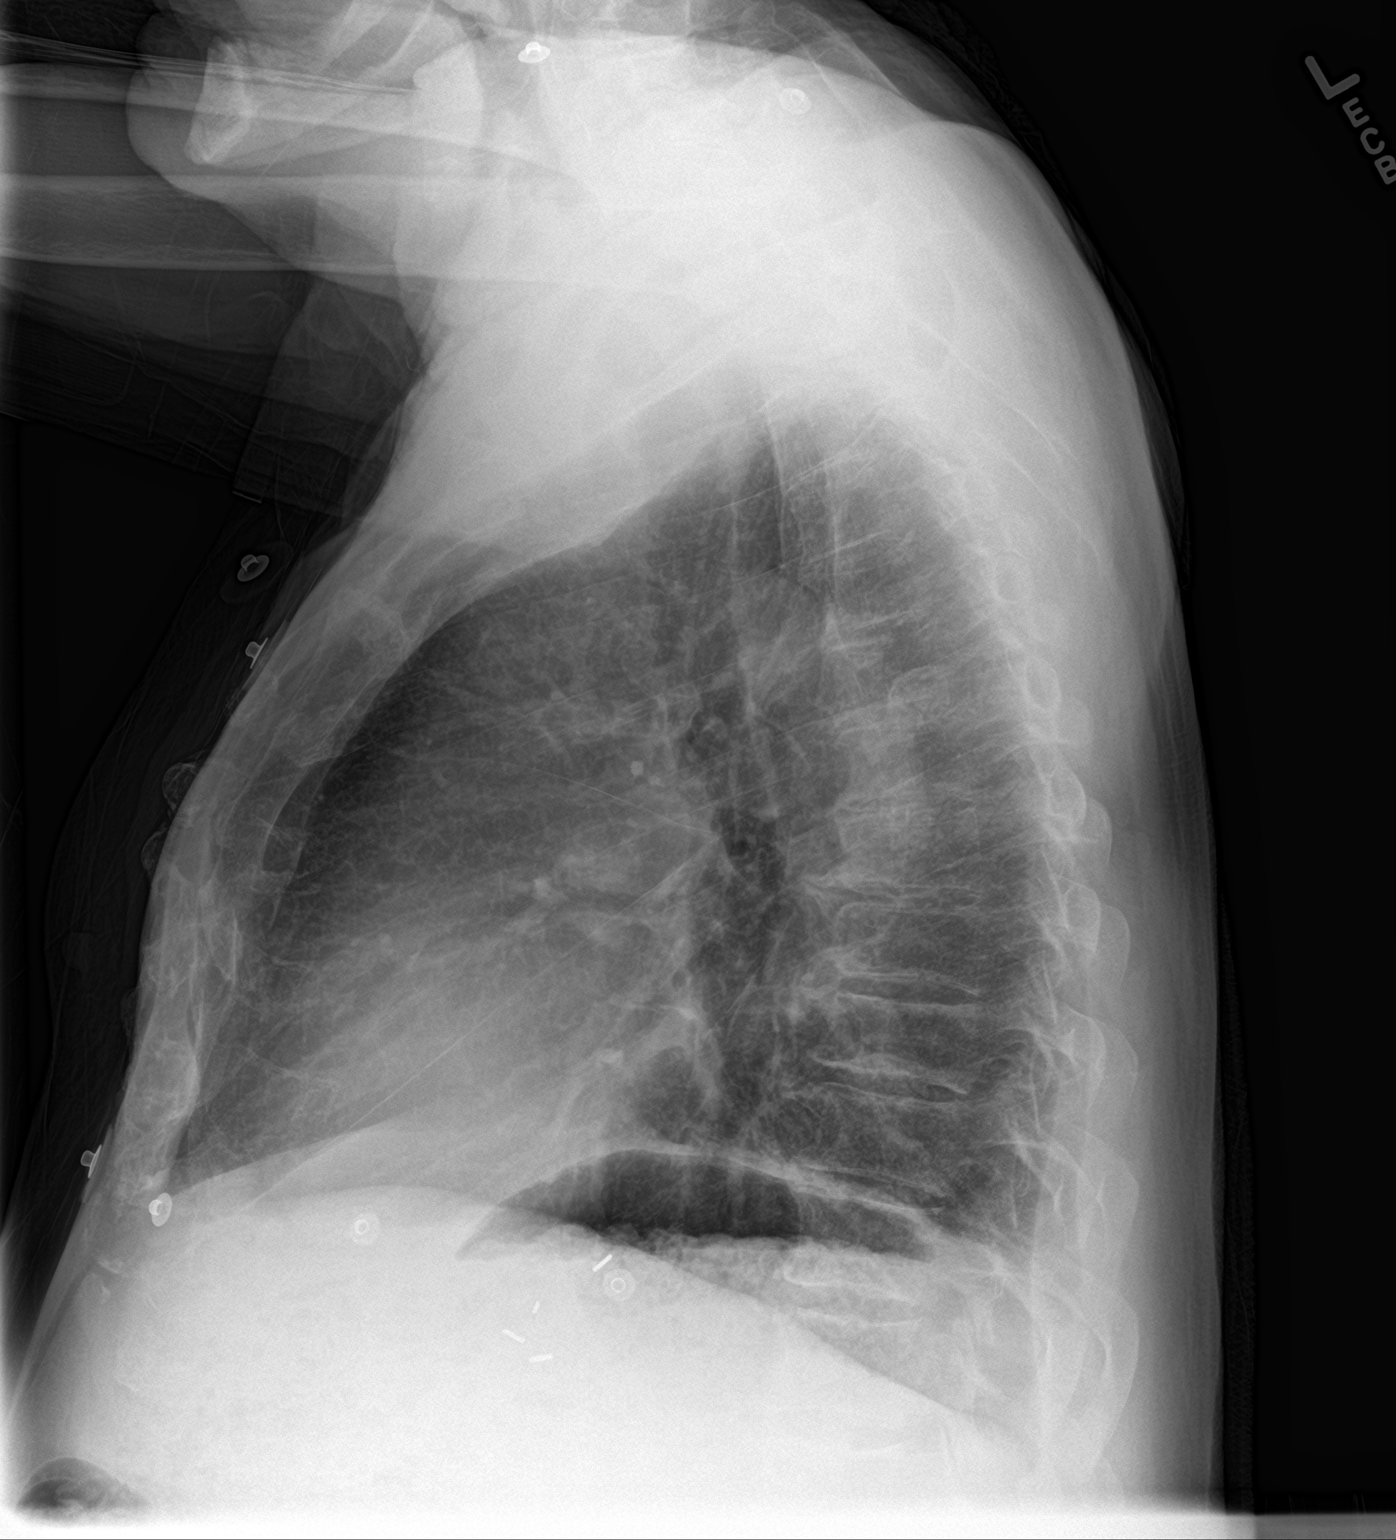

[2 of 2 positions shown; findings below may reference images not displayed]

FINDINGS: Cardiomediastinal silhouette unchanged in size and contour. No
evidence of central vascular congestion. No pneumothorax or pleural
effusion. No confluent airspace disease. Similar appearance of
coarsened interstitial markings throughout. No displaced fracture
with degenerative changes of the spine. Surgical changes in the
epigastric region.
IMPRESSION: Chronic lung changes without evidence of acute cardiopulmonary
disease.

## 2019-09-22 ENCOUNTER — Encounter: Payer: Self-pay | Admitting: Internal Medicine

## 2019-09-22 ENCOUNTER — Non-Acute Institutional Stay (SKILLED_NURSING_FACILITY): Payer: Medicare Other | Admitting: Internal Medicine

## 2019-09-22 DIAGNOSIS — D509 Iron deficiency anemia, unspecified: Secondary | ICD-10-CM | POA: Diagnosis not present

## 2019-09-22 DIAGNOSIS — I959 Hypotension, unspecified: Secondary | ICD-10-CM | POA: Diagnosis not present

## 2019-09-22 DIAGNOSIS — E1142 Type 2 diabetes mellitus with diabetic polyneuropathy: Secondary | ICD-10-CM | POA: Diagnosis not present

## 2019-09-22 DIAGNOSIS — I4891 Unspecified atrial fibrillation: Secondary | ICD-10-CM

## 2019-09-22 NOTE — Progress Notes (Signed)
Location:    [facility] Room Number: 595/G Place of Service:  SNF [phone]) Provider:  Tillie Rung, DO  Patient Care Team: Nicolette Bang, DO as PCP - General (Family Medicine) Leonie Man, MD as PCP - Cardiology (Cardiology) Constance Haw, MD as PCP - Electrophysiology (Cardiology)  Extended Emergency Contact Information Primary Emergency Contact: Foust,[name] Address: [address]          [place], [place] [address] Johnnette Litter of [place] Mobile Phone: [phone] Relation: Daughter  Code Status:  Full Code Goals of care: Advanced Directive information Advanced Directives 09/22/2019  Does Patient Have a Medical Advance Directive? Yes  Type of Advance Directive (No Data)  Does patient want to make changes to medical advance directive? No - Patient declined  Copy of [place] in Chart? -  Would patient like information on creating a medical advance directive? -  Pre-existing out of facility DNR order (yellow form or pink MOST form) -     Chief Complaint  Patient presents with  . Follow-up    Hypotension   Also follow-up of atrial fibrillation-type 2 diabetes-anemia  HPI:  Pt is a 79 y.o. male seen today for an acute visit for intermittent hypotension.  Patient was admitted here last month for rehab-he has had several recent hospitalizations.  He has a history of type 2 diabetes with neuropathy as well as anemia coronary artery disease COPD GERD hypertension frailty as well as hyperlipidemia and left eye blindness in addition to atrial fibrillation not on anticoagulant because of GI bleed and chronic anemia.  Her is also a history of peptic ulcer disease CVA and urinary retention with a Foley catheter in place.  He was hospitalized in early May with weakness fall shortness of breath as well as increasing edema his hemoglobin was 7.  Occult blood testing was positive.  He did  receive a transfusion of 1 unit of packed red blood cells and given IV fluids potassium magnesium he was started on iron and a PPI.  Subsequently was hospitalized later in May with a sharp abdominal pain.  He was diagnosed with pneumonia and constipation.  He was treated with Keflex.  Constipation apparently resolved.  Despite his numerous issues nursing staff reports he has been stable here and has no complaints today-he appears to be feeling relatively well.  It has been noted by staff at times his systolics are in the 60Y and they will hold his diltiazem-he does have orders for diltiazem extended release 120 mg twice daily.  Heart rate appears to be controlled running from the 60s to the 90s I see rare readings above 100 this is not common   Staff feels he does better when the diltiazem is given only once a day-and this tends to avoid the low blood pressures-and pulse remains generally well controlled.  Regards to other issues he is a type II diabetic he is on Amaryl 1 mg a day blood sugars appear to be quite stable mainly in the low to mid 100s occasional spikes but these are not persistent.  He also has a history of anemia he is on iron last hemoglobin was 9.0 appears previous range was in the mid sevens to around 11 will update this.  In regards to constipation he continues on Senexon twice daily as needed and apparently this has been stable as well.     Past Medical History:  Diagnosis Date  . Allergic  rhinitis   . Anemia    takes Ferrous Sulfate daily  . Arthritis    "all over"  . Atrial flutter ([facility])   . Balance problem 01/2014  . CAP (community acquired pneumonia) 09/18/2014  . Cervical radiculopathy due to degenerative joint disease of spine   . Chronic coronary artery disease    a. 03/2010 Nonocclusive disease by cath, performed for ST elevations on ECG;  b. 06/2013 Lexi MV: EF 60%, no ischemia.  [name] COPD (chronic obstructive pulmonary disease) ([facility])   . Diabetes  mellitus type II    takes Metformin and Lantus daily  . Diastolic CHF, chronic ([facility])    a. 12/2012 EF 55-60%, diast dysfxn, triv MR, mildly dil LA/RA.  [name] Dyspnea    with pain  . Essential hypertension    takes Diltiazem daily  . Frail elderly   . GERD (gastroesophageal reflux disease)   . History of blood transfusion 1982   "when I had stomach OR"  . History of bronchitis    1998  . History of CVA (cerebrovascular accident)   . History of gastric ulcer   . History of kidney stones   . Hypercholesterolemia    takes Pravastatin daily  . Intrinsic eczema   . Legally blind in left eye, as defined in [place]   . Obesity   . PAF (paroxysmal atrial fibrillation) ([facility])    Recurrent after atrial flutter (a. 07/2010 Status post caval tricuspid isthmus ablation by Dr. Midge Aver Metoprolol daily), currently controlled on flecainide plus diltiazem and Coumadin  . Urinary retention   . Vitamin D deficiency   . Weakness    numbness and tingling both hands   Past Surgical History:  Procedure Laterality Date  . ATRIAL ABLATION SURGERY  08/05/10   CTI ablation for atrial flutter by JA  . CARDIAC CATHETERIZATION  2012   nl LV function, no occlusive CAD, PAF  . CARDIOVERSION  12/07/2010    Successful direct current cardioversion with atrial fibrillation to normal sinus rhythm  . CARPAL TUNNEL RELEASE Bilateral 01/30/2014   Procedure: BILATERAL CARPAL TUNNEL RELEASE;  Surgeon: Marianna Payment, MD;  Location: [facility];  Service: Orthopedics;  Laterality: Bilateral;  . CATARACT EXTRACTION W/ INTRAOCULAR LENS  IMPLANT, BILATERAL Bilateral   . COLONOSCOPY [name] 12/02/2013   Procedure: COLONOSCOPY;  Surgeon: Irene Shipper, MD;  Location: [facility];  Service: Endoscopy;  Laterality: [name];  . ESOPHAGOGASTRODUODENOSCOPY [name] 09/22/2014   Procedure: ESOPHAGOGASTRODUODENOSCOPY (EGD);  Surgeon: Ronald Lobo, MD;  Location[facility] END[facility]COPY;  Service: Endoscopy;  Laterality: [name];  . ESOPHAGOGASTRODUODENOSCOPY [name]  07/11/2016   Procedure: ESOPHAGOGASTRODUODENOSCOPY (EGD);  Surgeon: Doran Stabler, MD;  Location: [facility] ENDOSCOPY;  Service: Endoscopy;  Laterality: [name];  . EXTRACORPOREAL SHOCK WAVE LITHOTRIPSY Right 07/15/2018   Procedure: EXTRACORPOREAL SHOCK WAVE LITHOTRIPSY (ESWL);  Surgeon: Lucas Mallow, MD;  Location: [facility];  Service: Urology;  Laterality: Right;  . INCISION AND DRAINAGE ABSCESS / HEMATOMA OF BURSA / KNEE / THIGH Left 1998   knee  . KNEE BURSECTOMY Left 1998  . LAPAROSCOPIC CHOLECYSTECTOMY  03/2010  . LEFT HEART CATH AND CORONARY ANGIOGRAPHY [name] 10/05/2017   Procedure: LEFT HEART CATH AND CORONARY ANGIOGRAPHY;  Surgeon: Martinique, Peter M, MD;  Location: [facility] CV LAB;  Service: Cardiovascular;  Laterality: [name];  . NM MYOVIEW LTD  07/22/2013   Normal EF ~60%, no ischemia or infarction.  [name] PACEMAKER IMPLANT [name] 10/21/2018   Procedure: PACEMAKER IMPLANT;  Surgeon: Deboraha Sprang, MD;  Location: [facility] CV LAB;  Service: Cardiovascular;  Laterality: [name];  . PARTIAL GASTRECTOMY  1982   subtotal; "took out 30% for ulcers"  . TRANSTHORACIC ECHOCARDIOGRAM  02/16/2014   EF 60%, no RWMA. - otherwise normal  . YAG LASER APPLICATION Bilateral     Allergies  Allergen Reactions  . Cyclobenzaprine Other (See Comments)    Unsteady gait  . Other Other (See Comments)    Steroids speeds his heart rate    Outpatient Encounter Medications as of 09/22/2019  Medication Sig  . acetaminophen (TYLENOL) 650 MG CR tablet Take 1,300 mg by mouth at bedtime. FOR LEFT SIDE PAIN (DO NOT CRUSH)  . aspirin EC 81 MG tablet Take 81 mg by mouth daily.  . bisacodyl (DULCOLAX) 10 MG suppository If not relieved by MOM, give 10 mg Bisacodyl suppositiory rectally X 1 dose in 24 hours as needed (Do not use constipation standing orders for residents with renal failure/CFR less than 30. Contact MD for orders) (Physician Order)  . citalopram (CELEXA) 20 MG tablet Take 1 tablet (20 mg total) by mouth daily.  [name]  diltiazem (CARDIZEM CD) 120 MG 24 hr capsule Take 1 capsule (120 mg total) by mouth 2 (two) times daily.  . ferrous sulfate 325 (65 FE) MG tablet Take 325 mg by mouth daily with breakfast.  . gabapentin (NEURONTIN) 100 MG capsule Take 1 capsule (100 mg total) by mouth at bedtime.  [name] glimepiride (AMARYL) 2 MG tablet TAKE 1 TABLET BY MOUTH DAILY BEFORE BREAKFAST.  [name] Glucerna (GLUCERNA) LIQD Take 237 mLs by mouth daily. Risk of malnutrition.  . magnesium hydroxide (MILK OF MAGNESIA) 400 MG/5ML suspension If no BM in 3 days, give 30 cc Milk of Magnesium p.o. x 1 dose in 24 hours as needed (Do not use standing constipation orders for residents with renal failure CFR less than 30. Contact MD for orders) (Physician Order)  . nitroGLYCERIN (NITROSTAT) 0.4 MG SL tablet Place 1 tablet (0.4 mg total) under the tongue every 5 (five) minutes x 3 doses as needed for chest pain.  . NON FORMULARY DIET :Regular NAS Heart healthy CCD  . pantoprazole (PROTONIX) 40 MG tablet Take 1 tablet (40 mg total) by mouth 2 (two) times daily before a meal. Dx: K21.9  . pravastatin (PRAVACHOL) 40 MG tablet Take 1 tablet (40 mg total) by mouth every evening.  . senna-docusate (SENOKOT-S) 8.6-50 MG tablet Take 1 tablet by mouth 2 (two) times daily as needed for mild constipation or moderate constipation.  . Sodium Phosphates (RA SALINE ENEMA RE) If not relieved by Biscodyl suppository, give disposable Saline Enema rectally X 1 dose/24 hrs as needed (Do not use constipation standing orders for residents with renal failure/CFR less than 30. Contact MD for orders)(Physician Or  . tamsulosin (FLOMAX) 0.4 MG CAPS capsule Take 1 capsule (0.4 mg total) by mouth daily.  . [DISCONTINUED] Accu-Chek FastClix Lancets MISC USE TO TEST BLOOD SUGAR TWICE DAILY. E11.00  . [DISCONTINUED] glucose blood (ACCU-CHEK GUIDE) test strip USE TO TEST BLOOD SUGAR TWICE DAILY. E11.00   No facility-administered encounter medications on file as of 09/22/2019.     Review of Systems   General is not complaining of any fever or chills.  Skin does not complain of rashes or itching.  Eyes he does have blindness of the left eye he does not complain of any acute visual changes.  Respiratory is not complaining of being short of breath or having a cough apparently there is been some previous  history of cough.  Cardiac is not complaining of chest pain or increasing edema.  GI is not really complaining of any abdominal pain or constipation today.  GU does have an indwelling Foley catheter apparently this is well-tolerated he is not complaining of dysuria.  Musculoskeletal is not complaining of pain.  Neurologic does not complain of dizziness headache or syncope despite the low blood pressures at times.  Psych is not complaining of being depressed or anxious appears to have some cognitive deficits but is pleasant.    Immunization History  Administered Date(s) Administered  . Influenza Split 12/07/2010, 12/18/2011  . Influenza, High Dose Seasonal PF 01/23/2017  . Influenza,inj,Quad PF,6+ Mos 01/09/2014, 01/13/2016, 02/19/2019  . Influenza-Unspecified 01/09/2014, 01/31/2015, 12/19/2017  . Pneumococcal Conjugate-13 12/18/2011  . Pneumococcal Polysaccharide-23 03/27/2004, 10/23/2014  . Pneumococcal-Unspecified 01/08/2013   Pertinent  Health Maintenance Due  Topic Date Due  . OPHTHALMOLOGY EXAM  02/03/2017  . FOOT EXAM  05/21/2018  . URINE MICROALBUMIN  10/02/2019  . INFLUENZA VACCINE  10/26/2019  . HEMOGLOBIN A1C  01/29/2020  . PNA vac Low Risk Adult  Completed   Fall Risk  04/22/2019  Falls in the past year? 0   Functional Status Survey:    Vitals:   09/22/19 1630  BP: (!) 90/56  Pulse: 74  Resp: 18  Temp: 98 F (36.7 C)  TempSrc: Oral  SpO2: 96%  Weight: 158 lb (71.7 kg)  Height: 5\' 6"  (1.676 m)   Body mass index is 25.5 kg/m. Physical Exam General this is a pleasant elderly male in no distress sitting comfortably in his  wheelchair.  His skin is warm and dry.  Eyes visual acuity appears to be at baseline he does have left eye blindness.  Oropharynx is clear mucous membranes moist.  Chest is clear to auscultation there is no labored breathing.  Heart is irregular irregular rate and rhythm without murmur gallop or rub could not really appreciate significant lower extremity edema.  Abdomen is soft nontender with positive bowel sounds.  GU does have an indwelling Foley catheter draining amber-colored urine  Musculoskeletal is in a wheelchair appears able to move all extremities x4 with lower extremity weakness.  Neurologic as noted above cannot really appreciate lateralizing findings his speech is clear  Psych he is pleasant and appropriate does have some cognitive deficits but follows simple verbal commands without difficulty.     Labs reviewed:  Aug 07, 2019.  WBC 6.4 hemoglobin 9.0 platelets 129.  Sodium 136 potassium 3.6 BUN 16 creatinine 0.93.   Recent Labs    10/20/18 0731 10/22/18 0650 07/29/19 0525 07/30/19 1143 08/01/19 0737 08/01/19 0737 08/03/19 0113 08/04/19 0652 08/05/19 0441  NA 138   < > 138   < > 135   < > 134* 133* 135  K 3.9   < > 3.3*   < > 3.9   < > 3.6 3.4* 3.7  CL 106   < > 105   < > 105   < > 100 103 104  CO2 23   < > 20*   < > 21*  --   --  20* 22  GLUCOSE 151*   < > 158*   < > 138*   < > 215* 171* 151*  BUN 13   < > 18   < > 13   < > 15 15 14   CREATININE 1.05   < > 1.33*   < > 1.31*   < > 1.10 1.32* 1.22  CALCIUM 8.4*   < > 9.3   < > 8.7*  --   --  8.4* 8.7*  MG 1.9  --  1.7  --   --   --   --  1.9 2.4  PHOS 3.5  --  3.0  --   --   --   --   --   --    < > = values in this interval not displayed.   Recent Labs    07/28/19 1634 07/29/19 0525 08/03/19 0110  AST 39 44* 48*  ALT 29 31 34  ALKPHOS 138* 133* 141*  BILITOT 0.8 1.3* 0.8  PROT 8.9* 8.8* 9.4*  ALBUMIN 2.6* 2.5* 2.6*   Recent Labs    06/15/19 0929 06/25/19 1338 07/28/19 1634  07/29/19 0525 08/03/19 0110 08/03/19 0110 08/03/19 0113 08/04/19 0652 08/05/19 0441  WBC 3.7*   < > 3.2*   < > 7.1  --   --  6.5 4.2  NEUTROABS 2.3  --  1.9  --  5.3  --   --   --   --   HGB 10.1*   < > 7.6*   < > 9.9*   < > 11.2* 8.4* 8.9*  HCT 32.4*   < > 24.5*   < > 33.5*   < > 33.0* 27.8* 29.4*  MCV 97.6   < > 84.5   < > 86.3  --   --  84.5 84.2  PLT 114*   < > PLATELET CLUMPS NOTED ON SMEAR, UNABLE TO ESTIMATE   < > 142*  --   --  130* 136*   < > = values in this interval not displayed.   Lab Results  Component Value Date   TSH 1.468 10/20/2018   Lab Results  Component Value Date   HGBA1C 8.0 (H) 07/29/2019   Lab Results  Component Value Date   CHOL 135 10/02/2018   HDL 47 10/02/2018   LDLCALC 69 10/02/2018   TRIG 94 10/02/2018   CHOLHDL 2.9 10/02/2018    Significant Diagnostic Results in last 30 days:  No results found.  Assessment/Plan   #1 history of hypotension-on diltiazem extended release 120 mg twice daily-it appears this is sometimes held per discussion with nursing -systolic is in the 73A today but apparently this is not really unusual   We will reduce the diltiazem to once a day I do note it is extended release-and monitor blood pressures-mentally he appears to be stable without overt signs of hypotension.  But would like to avoid systolics in the 19F.  2.  Atrial fibrillation at this point rate appears to be controlled with pulses ranging from the 60s to the 90s per chart review I only see rare readings above 100.  At this point will reduce diltiazem extended release to 100 mg twice daily and monitor he is not on anticoagulation because of history of chronic anemia as well as GI bleed in the past.--I note he is only on low-dose aspirin  3.  History of anemia he does continue on iron last hemoglobin was 9.0 which appears to be relatively baseline he has had some variability-he did get 1 unit of packed red blood cells during a previous  hospitalization-will update a CBC.  I note he is on Protonix with a history of previous GI bleed  4.  History of neuropathy he continues on Neurontin 100 mg at night.  5.  History of type 2 diabetes he is on Amaryl 1  mg a day as noted above blood sugars appear to be pretty well controlled averaging in the lower to mid 100s the lowest recent 1 I see is 81 and the highest is 247 but the spikes above 200 do not appear to be all that common.--His hemoglobin A1c was 8 when it was done in the hospital in May-I suspect this reflects prior control  6.  History of chronic diastolic CHF-his weight appears to be stable at 166.3 pounds there is been some variability but weight is stable compared to last month's weight I do not see concerning edema-he is not on diuretics.  7.  History of mixed hyperlipidemia he is on Pravachol not stated as uncontrolled-.  8.  History of constipation he is on Senexon twice daily as needed apparently this is stabilized.  9.  History of urinary retention --he is on Flomax 0.4 mg a day and continues with an indwelling Foley catheter.  10.  History of pneumonia-he does not really show signs of that today staff does not report any complaints of shortness of breath and concerning congestion or increased coughing.  Or fever  11.  History of depression this appears stable on Celexa 20 mg a day.   [id number]

## 2019-09-23 ENCOUNTER — Encounter (HOSPITAL_COMMUNITY): Payer: Self-pay

## 2019-09-23 ENCOUNTER — Other Ambulatory Visit: Payer: Self-pay

## 2019-09-23 ENCOUNTER — Encounter: Payer: Self-pay | Admitting: Internal Medicine

## 2019-09-23 ENCOUNTER — Inpatient Hospital Stay (HOSPITAL_COMMUNITY)
Admission: EM | Admit: 2019-09-23 | Discharge: 2019-10-09 | DRG: 377 | Disposition: A | Discharge status: Hospice | Payer: Medicare Other | Source: Skilled Nursing Facility | Attending: Internal Medicine | Admitting: Internal Medicine

## 2019-09-23 DIAGNOSIS — K921 Melena: Principal | ICD-10-CM | POA: Diagnosis present

## 2019-09-23 DIAGNOSIS — E1122 Type 2 diabetes mellitus with diabetic chronic kidney disease: Secondary | ICD-10-CM | POA: Diagnosis present

## 2019-09-23 DIAGNOSIS — N1832 Chronic kidney disease, stage 3b: Secondary | ICD-10-CM | POA: Diagnosis present

## 2019-09-23 DIAGNOSIS — E7800 Pure hypercholesterolemia, unspecified: Secondary | ICD-10-CM | POA: Diagnosis present

## 2019-09-23 DIAGNOSIS — J449 Chronic obstructive pulmonary disease, unspecified: Secondary | ICD-10-CM | POA: Diagnosis present

## 2019-09-23 DIAGNOSIS — Z6826 Body mass index (BMI) 26.0-26.9, adult: Secondary | ICD-10-CM

## 2019-09-23 DIAGNOSIS — E440 Moderate protein-calorie malnutrition: Secondary | ICD-10-CM | POA: Insufficient documentation

## 2019-09-23 DIAGNOSIS — R296 Repeated falls: Secondary | ICD-10-CM | POA: Diagnosis present

## 2019-09-23 DIAGNOSIS — I700 Atherosclerosis of aorta: Secondary | ICD-10-CM | POA: Diagnosis not present

## 2019-09-23 DIAGNOSIS — I130 Hypertensive heart and chronic kidney disease with heart failure and stage 1 through stage 4 chronic kidney disease, or unspecified chronic kidney disease: Secondary | ICD-10-CM | POA: Diagnosis present

## 2019-09-23 DIAGNOSIS — Z515 Encounter for palliative care: Secondary | ICD-10-CM | POA: Diagnosis not present

## 2019-09-23 DIAGNOSIS — D62 Acute posthemorrhagic anemia: Secondary | ICD-10-CM | POA: Diagnosis present

## 2019-09-23 DIAGNOSIS — R531 Weakness: Secondary | ICD-10-CM | POA: Diagnosis not present

## 2019-09-23 DIAGNOSIS — G9340 Encephalopathy, unspecified: Secondary | ICD-10-CM

## 2019-09-23 DIAGNOSIS — E874 Mixed disorder of acid-base balance: Secondary | ICD-10-CM | POA: Diagnosis present

## 2019-09-23 DIAGNOSIS — K5641 Fecal impaction: Secondary | ICD-10-CM | POA: Diagnosis not present

## 2019-09-23 DIAGNOSIS — Z0189 Encounter for other specified special examinations: Secondary | ICD-10-CM

## 2019-09-23 DIAGNOSIS — Z903 Acquired absence of stomach [part of]: Secondary | ICD-10-CM

## 2019-09-23 DIAGNOSIS — E876 Hypokalemia: Secondary | ICD-10-CM

## 2019-09-23 DIAGNOSIS — Z7189 Other specified counseling: Secondary | ICD-10-CM

## 2019-09-23 DIAGNOSIS — H548 Legal blindness, as defined in USA: Secondary | ICD-10-CM | POA: Diagnosis present

## 2019-09-23 DIAGNOSIS — A419 Sepsis, unspecified organism: Secondary | ICD-10-CM | POA: Diagnosis present

## 2019-09-23 DIAGNOSIS — R1310 Dysphagia, unspecified: Secondary | ICD-10-CM | POA: Diagnosis present

## 2019-09-23 DIAGNOSIS — I2510 Atherosclerotic heart disease of native coronary artery without angina pectoris: Secondary | ICD-10-CM | POA: Diagnosis present

## 2019-09-23 DIAGNOSIS — D6959 Other secondary thrombocytopenia: Secondary | ICD-10-CM | POA: Diagnosis present

## 2019-09-23 DIAGNOSIS — R600 Localized edema: Secondary | ICD-10-CM | POA: Diagnosis not present

## 2019-09-23 DIAGNOSIS — G9341 Metabolic encephalopathy: Secondary | ICD-10-CM | POA: Diagnosis not present

## 2019-09-23 DIAGNOSIS — Z20822 Contact with and (suspected) exposure to covid-19: Secondary | ICD-10-CM | POA: Diagnosis present

## 2019-09-23 DIAGNOSIS — R7989 Other specified abnormal findings of blood chemistry: Secondary | ICD-10-CM

## 2019-09-23 DIAGNOSIS — K922 Gastrointestinal hemorrhage, unspecified: Secondary | ICD-10-CM | POA: Diagnosis not present

## 2019-09-23 DIAGNOSIS — D649 Anemia, unspecified: Secondary | ICD-10-CM

## 2019-09-23 DIAGNOSIS — E785 Hyperlipidemia, unspecified: Secondary | ICD-10-CM | POA: Diagnosis present

## 2019-09-23 DIAGNOSIS — I480 Paroxysmal atrial fibrillation: Secondary | ICD-10-CM | POA: Diagnosis present

## 2019-09-23 DIAGNOSIS — Z7984 Long term (current) use of oral hypoglycemic drugs: Secondary | ICD-10-CM

## 2019-09-23 DIAGNOSIS — Z66 Do not resuscitate: Secondary | ICD-10-CM | POA: Diagnosis present

## 2019-09-23 DIAGNOSIS — Z03818 Encounter for observation for suspected exposure to other biological agents ruled out: Secondary | ICD-10-CM | POA: Diagnosis not present

## 2019-09-23 DIAGNOSIS — E870 Hyperosmolality and hypernatremia: Secondary | ICD-10-CM | POA: Diagnosis present

## 2019-09-23 DIAGNOSIS — Z8673 Personal history of transient ischemic attack (TIA), and cerebral infarction without residual deficits: Secondary | ICD-10-CM

## 2019-09-23 DIAGNOSIS — R627 Adult failure to thrive: Secondary | ICD-10-CM | POA: Diagnosis present

## 2019-09-23 DIAGNOSIS — K7200 Acute and subacute hepatic failure without coma: Secondary | ICD-10-CM | POA: Diagnosis present

## 2019-09-23 DIAGNOSIS — N3289 Other specified disorders of bladder: Secondary | ICD-10-CM | POA: Diagnosis not present

## 2019-09-23 DIAGNOSIS — Y846 Urinary catheterization as the cause of abnormal reaction of the patient, or of later complication, without mention of misadventure at the time of the procedure: Secondary | ICD-10-CM | POA: Diagnosis present

## 2019-09-23 DIAGNOSIS — Z7982 Long term (current) use of aspirin: Secondary | ICD-10-CM

## 2019-09-23 DIAGNOSIS — N390 Urinary tract infection, site not specified: Secondary | ICD-10-CM | POA: Diagnosis present

## 2019-09-23 DIAGNOSIS — E119 Type 2 diabetes mellitus without complications: Secondary | ICD-10-CM

## 2019-09-23 DIAGNOSIS — R52 Pain, unspecified: Secondary | ICD-10-CM

## 2019-09-23 DIAGNOSIS — Z87891 Personal history of nicotine dependence: Secondary | ICD-10-CM

## 2019-09-23 DIAGNOSIS — Z950 Presence of cardiac pacemaker: Secondary | ICD-10-CM

## 2019-09-23 DIAGNOSIS — R0600 Dyspnea, unspecified: Secondary | ICD-10-CM

## 2019-09-23 DIAGNOSIS — T83518A Infection and inflammatory reaction due to other urinary catheter, initial encounter: Secondary | ICD-10-CM | POA: Diagnosis present

## 2019-09-23 DIAGNOSIS — N179 Acute kidney failure, unspecified: Secondary | ICD-10-CM | POA: Diagnosis present

## 2019-09-23 DIAGNOSIS — Z79899 Other long term (current) drug therapy: Secondary | ICD-10-CM

## 2019-09-23 DIAGNOSIS — Z9049 Acquired absence of other specified parts of digestive tract: Secondary | ICD-10-CM

## 2019-09-23 DIAGNOSIS — I5032 Chronic diastolic (congestive) heart failure: Secondary | ICD-10-CM | POA: Diagnosis present

## 2019-09-23 LAB — CBC WITH DIFFERENTIAL/PLATELET
Abs Immature Granulocytes: 0.04 10*3/uL (ref 0.00–0.07)
Basophils Absolute: 0.1 10*3/uL (ref 0.0–0.1)
Basophils Relative: 2 %
Eosinophils Absolute: 0.2 10*3/uL (ref 0.0–0.5)
Eosinophils Relative: 4 %
HCT: 20.5 % — ABNORMAL LOW (ref 39.0–52.0)
Hemoglobin: 6.2 g/dL — CL (ref 13.0–17.0)
Immature Granulocytes: 1 %
Lymphocytes Relative: 17 %
Lymphs Abs: 0.8 10*3/uL (ref 0.7–4.0)
MCH: 27.9 pg (ref 26.0–34.0)
MCHC: 30.2 g/dL (ref 30.0–36.0)
MCV: 92.3 fL (ref 80.0–100.0)
Monocytes Absolute: 1.1 10*3/uL — ABNORMAL HIGH (ref 0.1–1.0)
Monocytes Relative: 22 %
Neutro Abs: 2.6 10*3/uL (ref 1.7–7.7)
Neutrophils Relative %: 54 %
Platelets: 111 10*3/uL — ABNORMAL LOW (ref 150–400)
RBC: 2.22 MIL/uL — ABNORMAL LOW (ref 4.22–5.81)
RDW: 25 % — ABNORMAL HIGH (ref 11.5–15.5)
WBC: 4.8 10*3/uL (ref 4.0–10.5)
nRBC: 0.4 % — ABNORMAL HIGH (ref 0.0–0.2)

## 2019-09-23 LAB — POC OCCULT BLOOD, ED: Fecal Occult Bld: POSITIVE — AB

## 2019-09-23 LAB — COMPREHENSIVE METABOLIC PANEL
ALT: 50 U/L — ABNORMAL HIGH (ref 0–44)
AST: 82 U/L — ABNORMAL HIGH (ref 15–41)
Albumin: 1.8 g/dL — ABNORMAL LOW (ref 3.5–5.0)
Alkaline Phosphatase: 146 U/L — ABNORMAL HIGH (ref 38–126)
Anion gap: 12 (ref 5–15)
BUN: 15 mg/dL (ref 8–23)
CO2: 20 mmol/L — ABNORMAL LOW (ref 22–32)
Calcium: 9.5 mg/dL (ref 8.9–10.3)
Chloride: 104 mmol/L (ref 98–111)
Creatinine, Ser: 1.41 mg/dL — ABNORMAL HIGH (ref 0.61–1.24)
GFR calc Af Amer: 54 mL/min — ABNORMAL LOW (ref >60)
GFR calc non Af Amer: 47 mL/min — ABNORMAL LOW (ref >60)
Glucose, Bld: 106 mg/dL — ABNORMAL HIGH (ref 70–99)
Potassium: 3.2 mmol/L — ABNORMAL LOW (ref 3.5–5.1)
Sodium: 136 mmol/L (ref 135–145)
Total Bilirubin: 0.7 mg/dL (ref 0.3–1.2)
Total Protein: 9.1 g/dL — ABNORMAL HIGH (ref 6.5–8.1)

## 2019-09-23 NOTE — ED Provider Notes (Signed)
[facility] EMERGENCY DEPARTMENT Provider Note   CSN: [id number] Arrival date & time: 09/23/19  4010     History Chief Complaint  Patient presents with   Abnormal Lab    [name] is a 79 y.o. male.  Patient sent to the emergency department from skilled nursing facility for evaluation of anemia.  Patient does have a reported history of chronic microcytic anemia but has had previous GI bleed in the past.  Outpatient hemoglobin found to be well below his baseline and therefore was sent to the ER.  He has been complaining of generalized weakness at the nursing home.  He reports that he is having pain in his penis secondary to chronic indwelling Foley catheter.  He has not been experiencing any significant abdominal pain.        Past Medical History:  Diagnosis Date   Allergic rhinitis    Anemia    takes Ferrous Sulfate daily   Arthritis    "all over"   Atrial flutter ([place])    Balance problem 01/2014   CAP (community acquired pneumonia) 09/18/2014   Cervical radiculopathy due to degenerative joint disease of spine    Chronic coronary artery disease    a. 03/2010 Nonocclusive disease by cath, performed for ST elevations on ECG;  b. 06/2013 Lexi MV: EF 60%, no ischemia.   COPD (chronic obstructive pulmonary disease) (HCC)    Diabetes mellitus type II    takes Metformin and Lantus daily   Diastolic CHF, chronic ([place])    a. 12/2012 EF 55-60%, diast dysfxn, triv MR, mildly dil LA/RA.   Dyspnea    with pain   Essential hypertension    takes Diltiazem daily   Frail elderly    GERD (gastroesophageal reflux disease)    History of blood transfusion 1982   "when I had stomach OR"   History of bronchitis    1998   History of CVA (cerebrovascular accident)    History of gastric ulcer    History of kidney stones    Hypercholesterolemia    takes Pravastatin daily   Intrinsic eczema    Legally blind in left eye, as defined in [place]     Obesity    PAF (paroxysmal atrial fibrillation) ([place])    Recurrent after atrial flutter (a. 07/2010 Status post caval tricuspid isthmus ablation by Dr. Midge Aver Metoprolol daily), currently controlled on flecainide plus diltiazem and Coumadin   Urinary retention    Vitamin D deficiency    Weakness    numbness and tingling both hands    Patient Active Problem List   Diagnosis Date Noted   HCAP (healthcare-associated pneumonia) 08/03/2019   Cardiac pacemaker in situ 10/29/2018   Major depressive disorder, recurrent severe without psychotic features ([place]) 04/15/2018   Atrial fibrillation with RVR ([place]) 11/08/2017   HTN (hypertension) 05/21/2017   Diabetes mellitus, type II ([place]) 27/25/3664   Diastolic CHF, chronic ([place]) 05/21/2017   COPD (chronic obstructive pulmonary disease) ([place]) 05/21/2017   H/O atrial flutter 05/21/2017   Heme positive stool    Symptomatic anemia 07/10/2016   Cardiac syncope 05/15/2015   Cervical disc disease 05/15/2015   Anemia 05/15/2015   History of CVA (cerebrovascular accident) 04/03/2014   Benign neoplasm of rectum and anal canal 12/02/2013   Tobacco use disorder 11/30/2013   Mixed hyperlipidemia 06/06/2013   Type 2 diabetes mellitus ([place]) 01/22/2013   Obesity (BMI 30-39.9) 12/14/2012   Persistent atrial fibrillation ([place]) 10/07/2010   Atrial flutter - status post  CTI ablation 07/29/2010   Essential hypertension 07/29/2010   Chronic diastolic congestive heart failure ([place]) 07/29/2010   Atherosclerotic heart disease of native coronary artery with other forms of angina pectoris ([place]) 07/29/2010    Past Surgical History:  Procedure Laterality Date   ATRIAL ABLATION SURGERY  08/05/10   CTI ablation for atrial flutter by [place]  2012   nl LV function, no occlusive CAD, PAF   CARDIOVERSION  12/07/2010    Successful direct current cardioversion with atrial fibrillation to normal sinus rhythm    CARPAL TUNNEL RELEASE Bilateral 01/30/2014   Procedure: BILATERAL CARPAL TUNNEL RELEASE;  Surgeon: Marianna Payment, MD;  Location: [place];  Service: Orthopedics;  Laterality: Bilateral;   CATARACT EXTRACTION W/ INTRAOCULAR LENS  IMPLANT, BILATERAL Bilateral    COLONOSCOPY [name] 12/02/2013   Procedure: COLONOSCOPY;  Surgeon: Irene Shipper, MD;  Location: [facility];  Service: Endoscopy;  Laterality: [name];   ESOPHAGOGASTRODUODENOSCOPY [name] 09/22/2014   Procedure: ESOPHAGOGASTRODUODENOSCOPY (EGD);  Surgeon: Ronald Lobo, MD;  Location: [facility] ENDOSCOPY;  Service: Endoscopy;  Laterality: [name];   ESOPHAGOGASTRODUODENOSCOPY [name] 07/11/2016   Procedure: ESOPHAGOGASTRODUODENOSCOPY (EGD);  Surgeon: Doran Stabler, MD;  Location: [facility] ENDOSCOPY;  Service: Endoscopy;  Laterality: [name];   EXTRACORPOREAL SHOCK WAVE LITHOTRIPSY Right 07/15/2018   Procedure: EXTRACORPOREAL SHOCK WAVE LITHOTRIPSY (ESWL);  Surgeon: Lucas Mallow, MD;  Location: [facility];  Service: Urology;  Laterality: Right;   INCISION AND DRAINAGE ABSCESS / HEMATOMA OF BURSA / KNEE / THIGH Left 1998   knee   KNEE BURSECTOMY Left 1998   LAPAROSCOPIC CHOLECYSTECTOMY  03/2010   LEFT HEART CATH AND CORONARY ANGIOGRAPHY [name] 10/05/2017   Procedure: LEFT HEART CATH AND CORONARY ANGIOGRAPHY;  Surgeon: Martinique, Peter M, MD;  Location: [place] CV LAB;  Service: Cardiovascular;  Laterality: [name];   NM MYOVIEW LTD  07/22/2013   Normal EF ~60%, no ischemia or infarction.   PACEMAKER IMPLANT [name] 10/21/2018   Procedure: PACEMAKER IMPLANT;  Surgeon: Deboraha Sprang, MD;  Location: [facility];  Service: Cardiovascular;  Laterality: [name];   PARTIAL GASTRECTOMY  1982   subtotal; "took out 30% for ulcers"   TRANSTHORACIC ECHOCARDIOGRAM  02/16/2014   EF 60%, no RWMA. - otherwise normal   YAG LASER APPLICATION Bilateral        Family History  Problem Relation Age of Onset   Breast cancer Mother    Diabetes Mother    Kidney disease Mother     Heart attack Father    Heart disease Father    Heart attack Brother    Leukemia Daughter     Social History   Tobacco Use   Smoking status: Former Smoker    Packs/day: 0.25    Years: 57.00    Pack years: 14.25    Types: Cigarettes   Smokeless tobacco: Never Used   Tobacco comment: He's been smoking between 0.5 and 2 ppd since age 34. QUIT 3 MONTHS AGO  Vaping Use   Vaping Use: Never used  Substance Use Topics   Alcohol use: No    Alcohol/week: 0.0 standard drinks    Comment: "quit drinking in 1986"   Drug use: No    Home Medications Prior to Admission medications   Medication Sig Start Date End Date Taking? Authorizing Provider  acetaminophen (TYLENOL) 650 MG CR tablet Take 1,300 mg by mouth at bedtime. FOR LEFT SIDE PAIN (DO NOT CRUSH) 08/14/19   [provider]  aspirin EC  81 MG tablet Take 81 mg by mouth daily.    [provider]  bisacodyl (DULCOLAX) 10 MG suppository If not relieved by MOM, give 10 mg Bisacodyl suppositiory rectally X 1 dose in 24 hours as needed (Do not use constipation standing orders for residents with renal failure/CFR less than 30. Contact MD for orders) (Physician Order) 08/06/19   [provider]  citalopram (CELEXA) 20 MG tablet Take 1 tablet (20 mg total) by mouth daily. 10/02/18   Scot Jun, FNP  diltiazem (CARDIZEM CD) 120 MG 24 hr capsule Take 1 capsule (120 mg total) by mouth 2 (two) times daily. Patient taking differently: Take 120 mg by mouth daily.  04/07/19   Camnitz, Ocie Doyne, MD  ferrous sulfate 325 (65 FE) MG tablet Take 325 mg by mouth daily with breakfast.    [provider]  gabapentin (NEURONTIN) 100 MG capsule Take 1 capsule (100 mg total) by mouth at bedtime. 04/22/19   Ladell Pier, MD  glimepiride (AMARYL) 2 MG tablet TAKE 1 TABLET BY MOUTH DAILY BEFORE BREAKFAST. 09/18/19   Nicolette Bang, DO  Glucerna (GLUCERNA) LIQD Take 237 mLs by mouth daily. Risk of  malnutrition. 08/06/19   [provider]  magnesium hydroxide (MILK OF MAGNESIA) 400 MG/5ML suspension If no BM in 3 days, give 30 cc Milk of Magnesium p.o. x 1 dose in 24 hours as needed (Do not use standing constipation orders for residents with renal failure CFR less than 30. Contact MD for orders) (Physician Order) 08/06/19   [provider]  nitroGLYCERIN (NITROSTAT) 0.4 MG SL tablet Place 1 tablet (0.4 mg total) under the tongue every 5 (five) minutes x 3 doses as needed for chest pain. 11/19/15   Erlene Quan, PA-C  NON FORMULARY DIET :Regular NAS Heart healthy CCD 08/05/19   [provider]  pantoprazole (PROTONIX) 40 MG tablet Take 1 tablet (40 mg total) by mouth 2 (two) times daily before a meal. Dx: K21.9 07/01/19   Nicolette Bang, DO  pravastatin (PRAVACHOL) 40 MG tablet Take 1 tablet (40 mg total) by mouth every evening. 07/01/19   Nicolette Bang, DO  senna-docusate (SENOKOT-S) 8.6-50 MG tablet Take 1 tablet by mouth 2 (two) times daily as needed for mild constipation or moderate constipation. 08/05/19   Little Ishikawa, MD  Sodium Phosphates (RA SALINE ENEMA RE) If not relieved by Biscodyl suppository, give disposable Saline Enema rectally X 1 dose/24 hrs as needed (Do not use constipation standing orders for residents with renal failure/CFR less than 30. Contact MD for orders)(Physician Or 08/06/19   [provider]  tamsulosin (FLOMAX) 0.4 MG CAPS capsule Take 1 capsule (0.4 mg total) by mouth daily. 10/02/18   Scot Jun, FNP    Allergies    Cyclobenzaprine and Other  Review of Systems   Review of Systems  Constitutional: Positive for fatigue.  Genitourinary: Positive for penile pain.  All other systems reviewed and are negative.   Physical Exam Updated Vital Signs BP 134/72 (BP Location: Right Arm)    Pulse (!) 105    Temp 98.7 F (37.1 C)    Resp 18    SpO2 100%   Physical Exam Vitals and nursing note reviewed.   Constitutional:      General: He is not in acute distress.    Appearance: Normal appearance. He is well-developed.  HENT:     Head: Normocephalic and atraumatic.     Right Ear: Hearing normal.  Left Ear: Hearing normal.     Nose: Nose normal.  Eyes:     Conjunctiva/sclera: Conjunctivae normal.     Pupils: Pupils are equal, round, and reactive to light.  Cardiovascular:     Rate and Rhythm: Regular rhythm.     Heart sounds: S1 normal and S2 normal. No murmur heard.  No friction rub. No gallop.   Pulmonary:     Effort: Pulmonary effort is normal. No respiratory distress.     Breath sounds: Normal breath sounds.  Chest:     Chest wall: No tenderness.  Abdominal:     General: Bowel sounds are normal.     Palpations: Abdomen is soft.     Tenderness: There is no abdominal tenderness. There is no guarding or rebound. Negative signs include Murphy's sign and McBurney's sign.     Hernia: No hernia is present.  Genitourinary:    Comments: Phonic indwelling Foley catheter with some edema of the penis and ulceration  Stool brown, no gross blood, Heme+ Musculoskeletal:        General: Normal range of motion.     Cervical back: Normal range of motion and neck supple.  Skin:    General: Skin is warm and dry.     Coloration: Skin is pale.     Findings: No rash.  Neurological:     Mental Status: He is alert and oriented to person, place, and time.     GCS: GCS eye subscore is 4. GCS verbal subscore is 5. GCS motor subscore is 6.     Cranial Nerves: No cranial nerve deficit.     Sensory: No sensory deficit.     Coordination: Coordination normal.  Psychiatric:        Speech: Speech normal.        Behavior: Behavior normal.        Thought Content: Thought content normal.     ED Results / Procedures / Treatments   Labs (all labs ordered are listed, but only abnormal results are displayed) Labs Reviewed  CBC WITH DIFFERENTIAL/PLATELET - Abnormal; Notable for the following  components:      Result Value   RBC 2.22 (*)    Hemoglobin 6.2 (*)    HCT 20.5 (*)    RDW 25.0 (*)    Platelets 111 (*)    nRBC 0.4 (*)    Monocytes Absolute 1.1 (*)    All other components within normal limits  COMPREHENSIVE METABOLIC PANEL - Abnormal; Notable for the following components:   Potassium 3.2 (*)    CO2 20 (*)    Glucose, Bld 106 (*)    Creatinine, Ser 1.41 (*)    Total Protein 9.1 (*)    Albumin 1.8 (*)    AST 82 (*)    ALT 50 (*)    Alkaline Phosphatase 146 (*)    GFR calc non Af Amer 47 (*)    GFR calc Af Amer 54 (*)    All other components within normal limits  POC OCCULT BLOOD, ED - Abnormal; Notable for the following components:   Fecal Occult Bld POSITIVE (*)    All other components within normal limits  URINE CULTURE  URINALYSIS, ROUTINE W REFLEX MICROSCOPIC  LACTIC ACID, PLASMA  TYPE AND SCREEN    EKG None  Radiology No results found.  Procedures Procedures (including critical care time)  Medications Ordered in ED Medications - No data to display  ED Course  I have reviewed the triage vital signs  and the nursing notes.  Pertinent labs & imaging results that were available during my care of the patient were reviewed by me and considered in my medical decision making (see chart for details).    MDM Rules/Calculators/A&P                          Patient sent to the emergency department for evaluation of generalized weakness and anemia.  Patient does have a history of chronic microcytic anemia but hemoglobin was found to be far below baseline today so he was sent to the ER from nursing home.  Hemoglobin today is 6.2, baseline appears to be 8-9.  Will require blood transfusion.  Hemoccult was positive, no melena, no gross blood.  Patient complaining of penile pain.  He has a chronic indwelling Foley catheter.  Examination of the catheter tubing arrival showed thick adherent sediment in the tubing.  There is no data on the tubing to the indicate  how old the Foley is.  This was changed out.  Urinalysis is grossly abnormal.  Reviewing previous urine cultures reveals Klebsiella, Enterococcus, Pseudomonas have all grown out of his most recent urine cultures this year.  It is unclear if these are colonization's or acute infection.  He does not appear infected at this time, no fever or other infectious type symptoms.  White blood cell count is 4.8.  At this point will not initiate antibiotics, cultures sent.  Final Clinical Impression(s) / ED Diagnoses Final diagnoses:  Symptomatic anemia  Gastrointestinal hemorrhage, unspecified gastrointestinal hemorrhage type    Rx / DC Orders ED Discharge Orders    None       Orpah Greek, MD 09/24/19 [phone]

## 2019-09-23 NOTE — ED Notes (Signed)
Date and time results received: 09/23/19  (use smartphrase ".now" to insert current time)   Test: Hg Critical Value:6.2 Hct 20.5 Name of Provider Notified: Darl Householder Orders Received? Or Actions Taken?:

## 2019-09-23 NOTE — ED Triage Notes (Signed)
Pt here via [facility] from [facility]. Pt c/o weakness to PCP, labs drawn, Hgb 6.2. PCP wants him to have a blood transfusion.    BP 121/57 HR 105 96% RA  CBG 146

## 2019-09-24 ENCOUNTER — Emergency Department (HOSPITAL_COMMUNITY): Payer: Medicare Other

## 2019-09-24 ENCOUNTER — Inpatient Hospital Stay (HOSPITAL_COMMUNITY): Payer: Medicare Other

## 2019-09-24 ENCOUNTER — Observation Stay (HOSPITAL_COMMUNITY): Payer: Medicare Other

## 2019-09-24 DIAGNOSIS — H548 Legal blindness, as defined in USA: Secondary | ICD-10-CM | POA: Diagnosis present

## 2019-09-24 DIAGNOSIS — Z7401 Bed confinement status: Secondary | ICD-10-CM | POA: Diagnosis not present

## 2019-09-24 DIAGNOSIS — Z7189 Other specified counseling: Secondary | ICD-10-CM | POA: Diagnosis not present

## 2019-09-24 DIAGNOSIS — Z6835 Body mass index (BMI) 35.0-35.9, adult: Secondary | ICD-10-CM | POA: Diagnosis not present

## 2019-09-24 DIAGNOSIS — N3289 Other specified disorders of bladder: Secondary | ICD-10-CM | POA: Diagnosis not present

## 2019-09-24 DIAGNOSIS — R627 Adult failure to thrive: Secondary | ICD-10-CM | POA: Diagnosis not present

## 2019-09-24 DIAGNOSIS — E1122 Type 2 diabetes mellitus with diabetic chronic kidney disease: Secondary | ICD-10-CM | POA: Diagnosis present

## 2019-09-24 DIAGNOSIS — E870 Hyperosmolality and hypernatremia: Secondary | ICD-10-CM | POA: Diagnosis present

## 2019-09-24 DIAGNOSIS — R531 Weakness: Secondary | ICD-10-CM | POA: Diagnosis not present

## 2019-09-24 DIAGNOSIS — E876 Hypokalemia: Secondary | ICD-10-CM | POA: Diagnosis present

## 2019-09-24 DIAGNOSIS — K7200 Acute and subacute hepatic failure without coma: Secondary | ICD-10-CM | POA: Diagnosis present

## 2019-09-24 DIAGNOSIS — I480 Paroxysmal atrial fibrillation: Secondary | ICD-10-CM | POA: Diagnosis present

## 2019-09-24 DIAGNOSIS — I2510 Atherosclerotic heart disease of native coronary artery without angina pectoris: Secondary | ICD-10-CM | POA: Diagnosis present

## 2019-09-24 DIAGNOSIS — K922 Gastrointestinal hemorrhage, unspecified: Secondary | ICD-10-CM | POA: Diagnosis not present

## 2019-09-24 DIAGNOSIS — R0902 Hypoxemia: Secondary | ICD-10-CM | POA: Diagnosis not present

## 2019-09-24 DIAGNOSIS — I110 Hypertensive heart disease with heart failure: Secondary | ICD-10-CM | POA: Diagnosis not present

## 2019-09-24 DIAGNOSIS — E785 Hyperlipidemia, unspecified: Secondary | ICD-10-CM | POA: Diagnosis present

## 2019-09-24 DIAGNOSIS — R1310 Dysphagia, unspecified: Secondary | ICD-10-CM | POA: Diagnosis not present

## 2019-09-24 DIAGNOSIS — K921 Melena: Secondary | ICD-10-CM | POA: Diagnosis present

## 2019-09-24 DIAGNOSIS — E1165 Type 2 diabetes mellitus with hyperglycemia: Secondary | ICD-10-CM | POA: Diagnosis not present

## 2019-09-24 DIAGNOSIS — T83518A Infection and inflammatory reaction due to other urinary catheter, initial encounter: Secondary | ICD-10-CM | POA: Diagnosis present

## 2019-09-24 DIAGNOSIS — I130 Hypertensive heart and chronic kidney disease with heart failure and stage 1 through stage 4 chronic kidney disease, or unspecified chronic kidney disease: Secondary | ICD-10-CM | POA: Diagnosis present

## 2019-09-24 DIAGNOSIS — N390 Urinary tract infection, site not specified: Secondary | ICD-10-CM | POA: Diagnosis present

## 2019-09-24 DIAGNOSIS — G459 Transient cerebral ischemic attack, unspecified: Secondary | ICD-10-CM | POA: Diagnosis not present

## 2019-09-24 DIAGNOSIS — R000 Tachycardia, unspecified: Secondary | ICD-10-CM | POA: Diagnosis not present

## 2019-09-24 DIAGNOSIS — Z4659 Encounter for fitting and adjustment of other gastrointestinal appliance and device: Secondary | ICD-10-CM | POA: Diagnosis not present

## 2019-09-24 DIAGNOSIS — E119 Type 2 diabetes mellitus without complications: Secondary | ICD-10-CM | POA: Diagnosis not present

## 2019-09-24 DIAGNOSIS — G9340 Encephalopathy, unspecified: Secondary | ICD-10-CM | POA: Diagnosis not present

## 2019-09-24 DIAGNOSIS — R4182 Altered mental status, unspecified: Secondary | ICD-10-CM | POA: Diagnosis not present

## 2019-09-24 DIAGNOSIS — K5641 Fecal impaction: Secondary | ICD-10-CM | POA: Diagnosis not present

## 2019-09-24 DIAGNOSIS — I5032 Chronic diastolic (congestive) heart failure: Secondary | ICD-10-CM | POA: Diagnosis present

## 2019-09-24 DIAGNOSIS — Z741 Need for assistance with personal care: Secondary | ICD-10-CM | POA: Diagnosis not present

## 2019-09-24 DIAGNOSIS — I700 Atherosclerosis of aorta: Secondary | ICD-10-CM | POA: Diagnosis not present

## 2019-09-24 DIAGNOSIS — R0609 Other forms of dyspnea: Secondary | ICD-10-CM | POA: Diagnosis not present

## 2019-09-24 DIAGNOSIS — A419 Sepsis, unspecified organism: Secondary | ICD-10-CM | POA: Diagnosis present

## 2019-09-24 DIAGNOSIS — I4891 Unspecified atrial fibrillation: Secondary | ICD-10-CM | POA: Diagnosis not present

## 2019-09-24 DIAGNOSIS — G9341 Metabolic encephalopathy: Secondary | ICD-10-CM | POA: Diagnosis present

## 2019-09-24 DIAGNOSIS — R52 Pain, unspecified: Secondary | ICD-10-CM | POA: Diagnosis not present

## 2019-09-24 DIAGNOSIS — Z20822 Contact with and (suspected) exposure to covid-19: Secondary | ICD-10-CM | POA: Diagnosis present

## 2019-09-24 DIAGNOSIS — E440 Moderate protein-calorie malnutrition: Secondary | ICD-10-CM | POA: Diagnosis present

## 2019-09-24 DIAGNOSIS — Z66 Do not resuscitate: Secondary | ICD-10-CM | POA: Diagnosis present

## 2019-09-24 DIAGNOSIS — R600 Localized edema: Secondary | ICD-10-CM | POA: Diagnosis not present

## 2019-09-24 DIAGNOSIS — J449 Chronic obstructive pulmonary disease, unspecified: Secondary | ICD-10-CM | POA: Diagnosis present

## 2019-09-24 DIAGNOSIS — D649 Anemia, unspecified: Secondary | ICD-10-CM | POA: Diagnosis not present

## 2019-09-24 DIAGNOSIS — R945 Abnormal results of liver function studies: Secondary | ICD-10-CM | POA: Diagnosis not present

## 2019-09-24 DIAGNOSIS — N179 Acute kidney failure, unspecified: Secondary | ICD-10-CM | POA: Diagnosis present

## 2019-09-24 DIAGNOSIS — E874 Mixed disorder of acid-base balance: Secondary | ICD-10-CM | POA: Diagnosis present

## 2019-09-24 DIAGNOSIS — D62 Acute posthemorrhagic anemia: Secondary | ICD-10-CM | POA: Diagnosis present

## 2019-09-24 DIAGNOSIS — Z515 Encounter for palliative care: Secondary | ICD-10-CM | POA: Diagnosis not present

## 2019-09-24 DIAGNOSIS — E7800 Pure hypercholesterolemia, unspecified: Secondary | ICD-10-CM | POA: Diagnosis present

## 2019-09-24 DIAGNOSIS — K219 Gastro-esophageal reflux disease without esophagitis: Secondary | ICD-10-CM | POA: Diagnosis not present

## 2019-09-24 DIAGNOSIS — M2550 Pain in unspecified joint: Secondary | ICD-10-CM | POA: Diagnosis not present

## 2019-09-24 DIAGNOSIS — Y846 Urinary catheterization as the cause of abnormal reaction of the patient, or of later complication, without mention of misadventure at the time of the procedure: Secondary | ICD-10-CM | POA: Diagnosis present

## 2019-09-24 DIAGNOSIS — I509 Heart failure, unspecified: Secondary | ICD-10-CM | POA: Diagnosis not present

## 2019-09-24 LAB — COMPREHENSIVE METABOLIC PANEL
ALT: 48 U/L — ABNORMAL HIGH (ref 0–44)
AST: 98 U/L — ABNORMAL HIGH (ref 15–41)
Albumin: 1.8 g/dL — ABNORMAL LOW (ref 3.5–5.0)
Alkaline Phosphatase: 161 U/L — ABNORMAL HIGH (ref 38–126)
Anion gap: 14 (ref 5–15)
BUN: 16 mg/dL (ref 8–23)
CO2: 16 mmol/L — ABNORMAL LOW (ref 22–32)
Calcium: 9.8 mg/dL (ref 8.9–10.3)
Chloride: 106 mmol/L (ref 98–111)
Creatinine, Ser: 1.71 mg/dL — ABNORMAL HIGH (ref 0.61–1.24)
GFR calc Af Amer: 43 mL/min — ABNORMAL LOW (ref >60)
GFR calc non Af Amer: 37 mL/min — ABNORMAL LOW (ref >60)
Glucose, Bld: 104 mg/dL — ABNORMAL HIGH (ref 70–99)
Potassium: 3.3 mmol/L — ABNORMAL LOW (ref 3.5–5.1)
Sodium: 136 mmol/L (ref 135–145)
Total Bilirubin: 1.4 mg/dL — ABNORMAL HIGH (ref 0.3–1.2)
Total Protein: 8.8 g/dL — ABNORMAL HIGH (ref 6.5–8.1)

## 2019-09-24 LAB — RETICULOCYTES
Immature Retic Fract: 45.2 % — ABNORMAL HIGH (ref 2.3–15.9)
RBC.: 2.72 MIL/uL — ABNORMAL LOW (ref 4.22–5.81)
Retic Count, Absolute: 97.4 10*3/uL (ref 19.0–186.0)
Retic Ct Pct: 3.6 % — ABNORMAL HIGH (ref 0.4–3.1)

## 2019-09-24 LAB — URINALYSIS, ROUTINE W REFLEX MICROSCOPIC
Bilirubin Urine: NEGATIVE
Glucose, UA: NEGATIVE mg/dL
Ketones, ur: NEGATIVE mg/dL
Nitrite: POSITIVE — AB
Protein, ur: 100 mg/dL — AB
RBC / HPF: >50 RBC/hpf — ABNORMAL HIGH (ref 0–5)
Specific Gravity, Urine: 1.012 (ref 1.005–1.030)
WBC, UA: >50 WBC/hpf — ABNORMAL HIGH (ref 0–5)
pH: 6 (ref 5.0–8.0)

## 2019-09-24 LAB — CBC
HCT: 24.5 % — ABNORMAL LOW (ref 39.0–52.0)
Hemoglobin: 7.4 g/dL — ABNORMAL LOW (ref 13.0–17.0)
MCH: 27.6 pg (ref 26.0–34.0)
MCHC: 30.2 g/dL (ref 30.0–36.0)
MCV: 91.4 fL (ref 80.0–100.0)
Platelets: 123 10*3/uL — ABNORMAL LOW (ref 150–400)
RBC: 2.68 MIL/uL — ABNORMAL LOW (ref 4.22–5.81)
RDW: 24.1 % — ABNORMAL HIGH (ref 11.5–15.5)
WBC: 4.9 10*3/uL (ref 4.0–10.5)
nRBC: 0.8 % — ABNORMAL HIGH (ref 0.0–0.2)

## 2019-09-24 LAB — VITAMIN B12: Vitamin B-12: 854 pg/mL (ref 180–914)

## 2019-09-24 LAB — I-STAT ARTERIAL BLOOD GAS, ED
Acid-base deficit: 4 mmol/L — ABNORMAL HIGH (ref 0.0–2.0)
Bicarbonate: 18.7 mmol/L — ABNORMAL LOW (ref 20.0–28.0)
Calcium, Ion: 1.34 mmol/L (ref 1.15–1.40)
HCT: 23 % — ABNORMAL LOW (ref 39.0–52.0)
Hemoglobin: 7.8 g/dL — ABNORMAL LOW (ref 13.0–17.0)
O2 Saturation: 96 %
Patient temperature: 98.3
Potassium: 2.9 mmol/L — ABNORMAL LOW (ref 3.5–5.1)
Sodium: 141 mmol/L (ref 135–145)
TCO2: 19 mmol/L — ABNORMAL LOW (ref 22–32)
pCO2 arterial: 24.9 mmHg — ABNORMAL LOW (ref 32.0–48.0)
pH, Arterial: 7.483 — ABNORMAL HIGH (ref 7.350–7.450)
pO2, Arterial: 73 mmHg — ABNORMAL LOW (ref 83.0–108.0)

## 2019-09-24 LAB — PREPARE RBC (CROSSMATCH)

## 2019-09-24 LAB — RAPID URINE DRUG SCREEN, HOSP PERFORMED
Amphetamines: NOT DETECTED
Barbiturates: NOT DETECTED
Benzodiazepines: NOT DETECTED
Cocaine: NOT DETECTED
Opiates: NOT DETECTED
Tetrahydrocannabinol: NOT DETECTED

## 2019-09-24 LAB — FOLATE: Folate: 12.1 ng/mL (ref >5.9)

## 2019-09-24 LAB — IRON AND TIBC
Iron: 36 ug/dL — ABNORMAL LOW (ref 45–182)
Saturation Ratios: 11 % — ABNORMAL LOW (ref 17.9–39.5)
TIBC: 330 ug/dL (ref 250–450)
UIBC: 294 ug/dL

## 2019-09-24 LAB — CBG MONITORING, ED
Glucose-Capillary: 97 mg/dL (ref 70–99)
Glucose-Capillary: 101 mg/dL — ABNORMAL HIGH (ref 70–99)
Glucose-Capillary: 154 mg/dL — ABNORMAL HIGH (ref 70–99)
Glucose-Capillary: 152 mg/dL — ABNORMAL HIGH (ref 70–99)

## 2019-09-24 LAB — MAGNESIUM: Magnesium: 1.8 mg/dL (ref 1.7–2.4)

## 2019-09-24 LAB — GLUCOSE, CAPILLARY
Glucose-Capillary: 111 mg/dL — ABNORMAL HIGH (ref 70–99)
Glucose-Capillary: 123 mg/dL — ABNORMAL HIGH (ref 70–99)
Glucose-Capillary: 87 mg/dL (ref 70–99)

## 2019-09-24 LAB — AMMONIA: Ammonia: 66 umol/L — ABNORMAL HIGH (ref 9–35)

## 2019-09-24 LAB — FERRITIN: Ferritin: 47 ng/mL (ref 24–336)

## 2019-09-24 LAB — SARS CORONAVIRUS 2 BY RT PCR (HOSPITAL ORDER, PERFORMED IN ~~LOC~~ HOSPITAL LAB): SARS Coronavirus 2: NEGATIVE

## 2019-09-24 LAB — BRAIN NATRIURETIC PEPTIDE: B Natriuretic Peptide: 550.9 pg/mL — ABNORMAL HIGH (ref 0.0–100.0)

## 2019-09-24 MED ORDER — INSULIN ASPART 100 UNIT/ML ~~LOC~~ SOLN
0.0000 [IU] | SUBCUTANEOUS | Status: DC
  Administered 2019-09-24 (×2): 2 [IU] via SUBCUTANEOUS
  Administered 2019-09-25 – 2019-09-28 (×7): 1 [IU] via SUBCUTANEOUS
  Administered 2019-09-29 (×2): 2 [IU] via SUBCUTANEOUS
  Administered 2019-09-29: 1 [IU] via SUBCUTANEOUS
  Administered 2019-09-29 (×3): 2 [IU] via SUBCUTANEOUS
  Administered 2019-09-30 (×5): 3 [IU] via SUBCUTANEOUS
  Administered 2019-10-01 – 2019-10-02 (×7): 2 [IU] via SUBCUTANEOUS
  Administered 2019-10-02: 1 [IU] via SUBCUTANEOUS
  Administered 2019-10-02: 2 [IU] via SUBCUTANEOUS
  Administered 2019-10-02: 3 [IU] via SUBCUTANEOUS
  Administered 2019-10-02: 2 [IU] via SUBCUTANEOUS
  Administered 2019-10-02: 1 [IU] via SUBCUTANEOUS
  Administered 2019-10-03 – 2019-10-05 (×13): 2 [IU] via SUBCUTANEOUS

## 2019-09-24 MED ORDER — DILTIAZEM HCL-DEXTROSE 125-5 MG/125ML-% IV SOLN (PREMIX)
5.0000 mg/h | INTRAVENOUS | Status: DC
  Filled 2019-09-24: qty 125

## 2019-09-24 MED ORDER — POTASSIUM CHLORIDE CRYS ER 20 MEQ PO TBCR: 40.0000 meq | EXTENDED_RELEASE_TABLET | Freq: Once | ORAL | Status: DC

## 2019-09-24 MED ORDER — SODIUM CHLORIDE 0.9 % IV BOLUS
1000.0000 mL | Freq: Once | INTRAVENOUS | Status: AC
  Administered 2019-09-24: 1000 mL via INTRAVENOUS

## 2019-09-24 MED ORDER — LACTULOSE ENEMA
300.0000 mL | Freq: Once | ORAL | Status: AC
  Administered 2019-09-24: 300 mL via RECTAL
  Filled 2019-09-24: qty 300

## 2019-09-24 MED ORDER — POTASSIUM CHLORIDE 10 MEQ/100ML IV SOLN
10.0000 meq | INTRAVENOUS | Status: AC
  Administered 2019-09-24 (×3): 10 meq via INTRAVENOUS
  Filled 2019-09-24 (×3): qty 100

## 2019-09-24 MED ORDER — IOHEXOL 300 MG/ML  SOLN
100.0000 mL | Freq: Once | INTRAMUSCULAR | Status: AC | PRN
  Administered 2019-09-24: 100 mL via INTRAVENOUS

## 2019-09-24 MED ORDER — ACETAMINOPHEN 325 MG PO TABS
650.0000 mg | ORAL_TABLET | Freq: Four times a day (QID) | ORAL | Status: DC | PRN
  Administered 2019-10-04: 650 mg via ORAL
  Filled 2019-09-24: qty 2

## 2019-09-24 MED ORDER — TAMSULOSIN HCL 0.4 MG PO CAPS
0.4000 mg | ORAL_CAPSULE | Freq: Every day | ORAL | Status: DC
  Filled 2019-09-24 (×6): qty 1

## 2019-09-24 MED ORDER — GABAPENTIN 100 MG PO CAPS: 100.0000 mg | ORAL_CAPSULE | Freq: Every day | ORAL | Status: DC

## 2019-09-24 MED ORDER — POTASSIUM CHLORIDE 10 MEQ/100ML IV SOLN: 10.0000 meq | INTRAVENOUS | Status: DC

## 2019-09-24 MED ORDER — SODIUM CHLORIDE 0.9 % IV BOLUS
500.0000 mL | Freq: Once | INTRAVENOUS | Status: AC
  Administered 2019-09-24: 500 mL via INTRAVENOUS

## 2019-09-24 MED ORDER — SODIUM CHLORIDE 0.9 % IV SOLN
10.0000 mL/h | Freq: Once | INTRAVENOUS | Status: AC
  Administered 2019-09-24: 10 mL/h via INTRAVENOUS

## 2019-09-24 MED ORDER — SODIUM CHLORIDE 0.9 % IV SOLN: INTRAVENOUS | Status: DC

## 2019-09-24 MED ORDER — DILTIAZEM HCL ER COATED BEADS 120 MG PO CP24: 120.0000 mg | ORAL_CAPSULE | Freq: Every day | ORAL | Status: DC

## 2019-09-24 MED ORDER — CITALOPRAM HYDROBROMIDE 10 MG PO TABS: 20.0000 mg | ORAL_TABLET | Freq: Every day | ORAL | Status: DC

## 2019-09-24 MED ORDER — SODIUM CHLORIDE 0.9 % IV SOLN
2.0000 g | Freq: Two times a day (BID) | INTRAVENOUS | Status: DC
  Administered 2019-09-24 – 2019-09-26 (×5): 2 g via INTRAVENOUS
  Filled 2019-09-24: qty 0.1
  Filled 2019-09-24 (×6): qty 2

## 2019-09-24 MED ORDER — PANTOPRAZOLE SODIUM 40 MG IV SOLR
40.0000 mg | Freq: Two times a day (BID) | INTRAVENOUS | Status: DC
  Administered 2019-09-24 – 2019-10-05 (×23): 40 mg via INTRAVENOUS
  Filled 2019-09-24 (×23): qty 40

## 2019-09-24 MED ORDER — ACETAMINOPHEN 10 MG/ML IV SOLN
1000.0000 mg | Freq: Once | INTRAVENOUS | Status: AC
  Administered 2019-09-24: 1000 mg via INTRAVENOUS
  Filled 2019-09-24: qty 100

## 2019-09-24 MED ORDER — CHLORHEXIDINE GLUCONATE CLOTH 2 % EX PADS
6.0000 | MEDICATED_PAD | Freq: Every day | CUTANEOUS | Status: DC
  Administered 2019-09-24 – 2019-10-05 (×12): 6 via TOPICAL

## 2019-09-24 MED ORDER — ACETAMINOPHEN 650 MG RE SUPP
650.0000 mg | Freq: Four times a day (QID) | RECTAL | Status: DC | PRN
  Administered 2019-09-25: 650 mg via RECTAL
  Filled 2019-09-24: qty 1

## 2019-09-24 MED ORDER — PRAVASTATIN SODIUM 40 MG PO TABS: 40.0000 mg | ORAL_TABLET | Freq: Every evening | ORAL | Status: DC

## 2019-09-24 MED ORDER — FERROUS SULFATE 325 (65 FE) MG PO TABS: 325.0000 mg | ORAL_TABLET | Freq: Every day | ORAL | Status: DC

## 2019-09-24 NOTE — Progress Notes (Signed)
Pharmacy Antibiotic Note  [name] is a 79 y.o. male admitted on 09/23/2019 with UTI.  Pharmacy has been consulted for cefepime dosing.  Plan: Cefepime 2gm IV q12 hours F/u renal function, cultures and clinical course    Temp (24hrs), Avg:98.3 F (36.8 C), Min:97.8 F (36.6 C), Max:98.9 F (37.2 C)  Recent Labs  Lab 09/23/19 2054  WBC 4.8  CREATININE 1.41*    Estimated Creatinine Clearance: 37.7 mL/min (A) (by C-G formula based on SCr of 1.41 mg/dL (H)).    Allergies  Allergen Reactions  . Cyclobenzaprine Other (See Comments)    Unsteady gait  . Other Other (See Comments)    Steroids speeds his heart rate     Thank you for allowing pharmacy to be a part of this patient's care.  Excell Seltzer Poteet 09/24/2019 6:24 AM

## 2019-09-24 NOTE — Progress Notes (Addendum)
Patient seen and examined.  HPI reviewed. Patient admitted with acute metabolic encephalopathy.  Patient present with  anemia and occult blood positive.  1-symptomatic acute blood loss anemia presumed to occult GI bleed: 70 hemoglobin of 6.  At baseline 8-9 Occult blood positive. Received 1 unit of packed red blood cell CT with presumed fecal impaction.  Tapwater enema ordered GI has been consulted.  2-acute metabolic encephalopathy: Patient presented with altered mental status.  Keep eyes closed, say a few words. ABG pH with respiratory alkalosis.  Metabolic acidosis on the vent Support care Protecting airway Check ammonia level  3-early sepsis: Possible CAUTI; patient has chronic Foley catheter due to urinary retention  Patient febrile hypotensive. Continue with fluids.  IV antibiotics. With cefepime. With more than 50 white blood cell.  Follow cultures  4-Transaminases: Right upper quadrant ultrasound; Status post cholecystectomy.  No biliary dilatation. Unremarkable sonographic appearance of the liver.  Paroxysmal A. fib holding aspirin due to concern for GI bleed  Diabetes: Continue with sliding scale insulin Chronic diastolic heart failure: Chest x-ray with progressive central vascular congestion.  Monitor for pulmonary edema on IVF.  Call by nurse, patient was lethargic no responsive, she perform sternal rub. HR elevated (200). . I came to bedside. Patient keeps eyes close, would answer few question, he was able to open his mouth.  Patient in A fib RVR. Febrile.  -Plan for IV bolus, IV tylenol.  -Replete Potasium IV.  -Start Cardizem Gtt.  -Transfer to telemetry.  Left message to daughter, no answer.  Elmarie Shiley, MD

## 2019-09-24 NOTE — ED Notes (Signed)
Patient transported to CT 

## 2019-09-24 NOTE — H&P (Addendum)
History and Physical    [name] XNA:[id number] DOB: 1940/05/29 DOA: 09/23/2019  PCP: Nicolette Bang, DO Patient coming from: [facility]  Chief Complaint: Generalized weakness, anemia  HPI: [name] is a 79 y.o. male with medical history significant of paroxysmal A. fib on low-dose aspirin but not anticoagulation due to chronic anemia and history of GI bleed, CAD, COPD, CVA, non-insulin-dependent type 2 diabetes, chronic diastolic CHF, hypertension, hyperlipidemia, GERD, history of gastric ulcer, history of urinary retention with a chronic indwelling Foley catheter presenting to the ED from his rehab facility for evaluation of generalized weakness and anemia.  Labs drawn at the facility revealed worsening anemia with hemoglobin 6.2.  Patient is currently very somnolent and difficult to arouse.  Upon waking up, he is able to tell me his name but is not oriented to place or time.  Not able to give any history or follow any commands.  ED Course: Mildly tachycardic.  Afebrile.  Labs showing no leukocytosis.  Hemoglobin 6.2, baseline 8-9.  FOBT positive but no grossly bloody stool noted on rectal exam.  Platelet count 111k, previously in the 120-130k range.  Potassium 3.2.  Bicarb 20, slightly low on previous labs as well.  Creatinine 1.4, no significant change from baseline.  Transaminases and alk phos mildly elevated.  T bili normal.  UA with positive nitrite, large amount of leukocytes, greater than 50 WBCs, and many bacteria.  Urine culture pending.  Chest x-ray showing progressive central vascular congestion without overt edema.  Lower lungs visualized on CT abdomen pelvis showing a small right pleural effusion.  CT showing moderate stool burden throughout the colon.  1 unit PRBCs ordered.  Review of Systems:  All systems reviewed and apart from history of presenting illness, are negative.  Past Medical History:  Diagnosis Date  . Allergic rhinitis   . Anemia     takes Ferrous Sulfate daily  . Arthritis    "all over"  . Atrial flutter ([place])   . Balance problem 01/2014  . CAP (community acquired pneumonia) 09/18/2014  . Cervical radiculopathy due to degenerative joint disease of spine   . Chronic coronary artery disease    a. 03/2010 Nonocclusive disease by cath, performed for ST elevations on ECG;  b. 06/2013 Lexi MV: EF 60%, no ischemia.  [name] COPD (chronic obstructive pulmonary disease) ([place])   . Diabetes mellitus type II    takes Metformin and Lantus daily  . Diastolic CHF, chronic ([place])    a. 12/2012 EF 55-60%, diast dysfxn, triv MR, mildly dil LA/RA.  [name] Dyspnea    with pain  . Essential hypertension    takes Diltiazem daily  . Frail elderly   . GERD (gastroesophageal reflux disease)   . History of blood transfusion 1982   "when I had stomach OR"  . History of bronchitis    1998  . History of CVA (cerebrovascular accident)   . History of gastric ulcer   . History of kidney stones   . Hypercholesterolemia    takes Pravastatin daily  . Intrinsic eczema   . Legally blind in left eye, as defined in [place]   . Obesity   . PAF (paroxysmal atrial fibrillation) ([facility])    Recurrent after atrial flutter (a. 07/2010 Status post caval tricuspid isthmus ablation by Dr. Midge Aver Metoprolol daily), currently controlled on flecainide plus diltiazem and Coumadin  . Urinary retention   . Vitamin D deficiency   . Weakness    numbness and tingling both hands  Past Surgical History:  Procedure Laterality Date  . ATRIAL ABLATION SURGERY  08/05/10   CTI ablation for atrial flutter by [facility]  . CARDIAC CATHETERIZATION  2012   nl LV function, no occlusive CAD, PAF  . CARDIOVERSION  12/07/2010    Successful direct current cardioversion with atrial fibrillation to normal sinus rhythm  . CARPAL TUNNEL RELEASE Bilateral 01/30/2014   Procedure: BILATERAL CARPAL TUNNEL RELEASE;  Surgeon: Marianna Payment, MD;  Location: [facility];  Service: Orthopedics;  Laterality:  Bilateral;  . CATARACT EXTRACTION W/ INTRAOCULAR LENS  IMPLANT, BILATERAL Bilateral   . COLONOSCOPY [name] 12/02/2013   Procedure: COLONOSCOPY;  Surgeon: Irene Shipper, MD;  Location: [facility];  Service: Endoscopy;  Laterality: [name];  . ESOPHAGOGASTRODUODENOSCOPY [name] 09/22/2014   Procedure: ESOPHAGOGASTRODUODENOSCOPY (EGD);  Surgeon: Ronald Lobo, MD;  Location: [facility] ENDOSCOPY;  Service: Endoscopy;  Laterality: [name];  . ESOPHAGOGASTRODUODENOSCOPY [name] 07/11/2016   Procedure: ESOPHAGOGASTRODUODENOSCOPY (EGD);  Surgeon: Doran Stabler, MD;  Location: [facility] ENDOSCOPY;  Service: Endoscopy;  Laterality: [name];  . EXTRACORPOREAL SHOCK WAVE LITHOTRIPSY Right 07/15/2018   Procedure: EXTRACORPOREAL SHOCK WAVE LITHOTRIPSY (ESWL);  Surgeon: Lucas Mallow, MD;  Location: [facility];  Service: Urology;  Laterality: Right;  . INCISION AND DRAINAGE ABSCESS / HEMATOMA OF BURSA / KNEE / THIGH Left 1998   knee  . KNEE BURSECTOMY Left 1998  . LAPAROSCOPIC CHOLECYSTECTOMY  03/2010  . LEFT HEART CATH AND CORONARY ANGIOGRAPHY [name] 10/05/2017   Procedure: LEFT HEART CATH AND CORONARY ANGIOGRAPHY;  Surgeon: Martinique, Peter M, MD;  Location: [facility];  Service: Cardiovascular;  Laterality: [name];  . NM MYOVIEW LTD  07/22/2013   Normal EF ~60%, no ischemia or infarction.  [name] PACEMAKER IMPLANT [name] 10/21/2018   Procedure: PACEMAKER IMPLANT;  Surgeon: Deboraha Sprang, MD;  Location: [facility];  Service: Cardiovascular;  Laterality: [name];  . PARTIAL GASTRECTOMY  1982   subtotal; "took out 30% for ulcers"  . TRANSTHORACIC ECHOCARDIOGRAM  02/16/2014   EF 60%, no RWMA. - otherwise normal  . YAG LASER APPLICATION Bilateral      reports that he has quit smoking. His smoking use included cigarettes. He has a 14.25 pack-year smoking history. He has never used smokeless tobacco. He reports that he does not drink alcohol and does not use drugs.  Allergies  Allergen Reactions  . Cyclobenzaprine Other (See Comments)     Unsteady gait  . Other Other (See Comments)    Steroids speeds his heart rate    Family History  Problem Relation Age of Onset  . Breast cancer Mother   . Diabetes Mother   . Kidney disease Mother   . Heart attack Father   . Heart disease Father   . Heart attack Brother   . Leukemia Daughter     Prior to Admission medications   Medication Sig Start Date End Date Taking? Authorizing Provider  acetaminophen (TYLENOL) 650 MG CR tablet Take 1,300 mg by mouth at bedtime. FOR LEFT SIDE PAIN (DO NOT CRUSH) 08/14/19  Yes [provider]  aspirin EC 81 MG tablet Take 81 mg by mouth daily.   Yes [provider]  bisacodyl (DULCOLAX) 10 MG suppository If not relieved by MOM, give 10 mg Bisacodyl suppositiory rectally X 1 dose in 24 hours as needed (Do not use constipation standing orders for residents with renal failure/CFR less than 30. Contact MD for orders) (Physician Order) 08/06/19  Yes [provider]  citalopram (CELEXA) 20  MG tablet Take 1 tablet (20 mg total) by mouth daily. 10/02/18  Yes Scot Jun, FNP  diltiazem (CARDIZEM CD) 120 MG 24 hr capsule Take 1 capsule (120 mg total) by mouth 2 (two) times daily. 04/07/19  Yes Camnitz, Will Hassell Done, MD  ferrous sulfate 325 (65 FE) MG tablet Take 325 mg by mouth daily with breakfast.   Yes [provider]  gabapentin (NEURONTIN) 100 MG capsule Take 1 capsule (100 mg total) by mouth at bedtime. 04/22/19  Yes Ladell Pier, MD  glimepiride (AMARYL) 1 MG tablet Take 1 mg by mouth daily before breakfast. 09/15/19  Yes [provider]  Glucerna (GLUCERNA) LIQD Take 237 mLs by mouth daily. Risk of malnutrition. 08/06/19  Yes [provider]  magnesium hydroxide (MILK OF MAGNESIA) 400 MG/5ML suspension If no BM in 3 days, give 30 cc Milk of Magnesium p.o. x 1 dose in 24 hours as needed (Do not use standing constipation orders for residents with renal failure CFR less than 30. Contact MD for  orders) (Physician Order) 08/06/19  Yes [provider]  nitroGLYCERIN (NITROSTAT) 0.4 MG SL tablet Place 1 tablet (0.4 mg total) under the tongue every 5 (five) minutes x 3 doses as needed for chest pain. 11/19/15  Yes Kilroy, Luke K, PA-C  pantoprazole (PROTONIX) 40 MG tablet Take 1 tablet (40 mg total) by mouth 2 (two) times daily before a meal. Dx: K21.9 07/01/19  Yes Nicolette Bang, DO  pravastatin (PRAVACHOL) 40 MG tablet Take 1 tablet (40 mg total) by mouth every evening. 07/01/19  Yes Nicolette Bang, DO  senna-docusate (SENOKOT-S) 8.6-50 MG tablet Take 1 tablet by mouth 2 (two) times daily as needed for mild constipation or moderate constipation. 08/05/19  Yes Little Ishikawa, MD  Sodium Phosphates (RA SALINE ENEMA RE) If not relieved by Biscodyl suppository, give disposable Saline Enema rectally X 1 dose/24 hrs as needed (Do not use constipation standing orders for residents with renal failure/CFR less than 30. Contact MD for orders)(Physician Or 08/06/19  Yes [provider]  tamsulosin (FLOMAX) 0.4 MG CAPS capsule Take 1 capsule (0.4 mg total) by mouth daily. 10/02/18  Yes Scot Jun, FNP  NON FORMULARY DIET :Regular NAS Heart healthy CCD 08/05/19   [provider]    Physical Exam: Vitals:   09/24/19 0300 09/24/19 0345 09/24/19 0400 09/24/19 0500  BP: (!) 111/55 111/60 (!) 92/48 (!) 92/56  Pulse: 90 (!) 104 (!) 103 90  Resp: 15 17 15 15   Temp:    98.9 F (37.2 C)  TempSrc:    Oral  SpO2: 98% 100% 100% 100%    Physical Exam Constitutional:      Comments: Very somnolent  HENT:     Head: Normocephalic.     Mouth/Throat:     Mouth: Mucous membranes are dry.  Eyes:     Pupils: Pupils are equal, round, and reactive to light.  Cardiovascular:     Rate and Rhythm: Normal rate and regular rhythm.     Pulses: Normal pulses.  Pulmonary:     Effort: Pulmonary effort is normal. No respiratory distress.     Breath sounds: Normal  breath sounds. No wheezing or rales.  Abdominal:     General: Bowel sounds are normal.     Palpations: Abdomen is soft.     Tenderness: There is no abdominal tenderness. There is no guarding or rebound.  Musculoskeletal:        General: No swelling.  Cervical back: Neck supple.  Skin:    General: Skin is warm and dry.  Neurological:     Comments: Oriented to self only Not answering any questions or following any commands     Labs on Admission: I have personally reviewed following labs and imaging studies  CBC: Recent Labs  Lab 09/23/19 2054  WBC 4.8  NEUTROABS 2.6  HGB 6.2*  HCT 20.5*  MCV 92.3  PLT 161*   Basic Metabolic Panel: Recent Labs  Lab 09/23/19 2054  NA 136  K 3.2*  CL 104  CO2 20*  GLUCOSE 106*  BUN 15  CREATININE 1.41*  CALCIUM 9.5   GFR: Estimated Creatinine Clearance: 37.7 mL/min (A) (by C-G formula based on SCr of 1.41 mg/dL (H)). Liver Function Tests: Recent Labs  Lab 09/23/19 2054  AST 82*  ALT 50*  ALKPHOS 146*  BILITOT 0.7  PROT 9.1*  ALBUMIN 1.8*   No results for input(s): LIPASE, AMYLASE in the last 168 hours. No results for input(s): AMMONIA in the last 168 hours. Coagulation Profile: No results for input(s): INR, PROTIME in the last 168 hours. Cardiac Enzymes: No results for input(s): CKTOTAL, CKMB, CKMBINDEX, TROPONINI in the last 168 hours. BNP (last 3 results) No results for input(s): PROBNP in the last 8760 hours. HbA1C: No results for input(s): HGBA1C in the last 72 hours. CBG: No results for input(s): GLUCAP in the last 168 hours. Lipid Profile: No results for input(s): CHOL, HDL, LDLCALC, TRIG, CHOLHDL, LDLDIRECT in the last 72 hours. Thyroid Function Tests: No results for input(s): TSH, T4TOTAL, FREET4, T3FREE, THYROIDAB in the last 72 hours. Anemia Panel: No results for input(s): VITAMINB12, FOLATE, FERRITIN, TIBC, IRON, RETICCTPCT in the last 72 hours. Urine analysis:    Component Value Date/Time    COLORURINE YELLOW 09/23/2019 2348   APPEARANCEUR CLOUDY (A) 09/23/2019 2348   LABSPEC 1.012 09/23/2019 2348   PHURINE 6.0 09/23/2019 2348   GLUCOSEU NEGATIVE 09/23/2019 2348   HGBUR LARGE (A) 09/23/2019 2348   BILIRUBINUR NEGATIVE 09/23/2019 2348   BILIRUBINUR negative 10/02/2018 0915   KETONESUR NEGATIVE 09/23/2019 2348   PROTEINUR 100 (A) 09/23/2019 2348   UROBILINOGEN 0.2 10/02/2018 0915   UROBILINOGEN 0.2 10/09/2014 2315   NITRITE POSITIVE (A) 09/23/2019 2348   LEUKOCYTESUR LARGE (A) 09/23/2019 2348    Radiological Exams on Admission: CT ABDOMEN PELVIS W CONTRAST  Result Date: 09/24/2019 CLINICAL DATA:  Weakness, anemia EXAM: CT ABDOMEN AND PELVIS WITH CONTRAST TECHNIQUE: Multidetector CT imaging of the abdomen and pelvis was performed using the standard protocol following bolus administration of intravenous contrast. CONTRAST:  168m OMNIPAQUE IOHEXOL 300 MG/ML  SOLN COMPARISON:  08/03/2019 FINDINGS: Lower chest: There is a small right pleural effusion volume estimated less than 1 L. minimal dependent atelectasis within the lower lobes. Hepatobiliary: Gallbladder is surgically absent. Slight increase in periportal edema since prior study. This is nonspecific. The remainder of the liver is unremarkable. Pancreas: Unremarkable. No pancreatic ductal dilatation or surrounding inflammatory changes. Spleen: Normal in size without focal abnormality. Adrenals/Urinary Tract: The kidneys enhance normally and symmetrically. No urinary tract calculi or obstruction. The adrenals are normal. Bladder is decompressed with a Foley catheter. Stomach/Bowel: No bowel obstruction or ileus. Moderate stool throughout the colon, most pronounced within the rectal vault which may reflect impaction. Normal appendix right lower quadrant. Vascular/Lymphatic: Aortic atherosclerosis. No enlarged abdominal or pelvic lymph nodes. Reproductive: Prostate is unremarkable. Other: Haziness of the central mesenteric fat at the  level of the celiac axis is unchanged. No  free fluid or free gas. No abdominal wall hernia. Musculoskeletal: No acute or destructive bony lesions. Reconstructed images demonstrate no additional findings. IMPRESSION: 1. Small right pleural effusion volume estimated less than 1 L. 2. Moderate stool throughout the colon, most pronounced within the rectal vault which may reflect impaction. 3. Slight increase in periportal edema since prior study. This is nonspecific. 4. Aortic Atherosclerosis (ICD10-I70.0). Electronically Signed   By: Randa Ngo M.D.   On: 09/24/2019 02:52   DG Chest Port 1 View  Result Date: 09/24/2019 CLINICAL DATA:  Weakness EXAM: PORTABLE CHEST 1 VIEW COMPARISON:  07/28/2019 FINDINGS: Single frontal view of the chest demonstrates stable dual lead pacemaker. Cardiac silhouette is mildly enlarged. There is chronic central vascular congestion, which has progressed slightly on this exam. No airspace disease, effusion, or pneumothorax. No acute bony abnormalities. IMPRESSION: 1. Progressive central vascular congestion without overt edema. Electronically Signed   By: Randa Ngo M.D.   On: 09/24/2019 00:55    EKG: Independently reviewed.  Atrial fibrillation, heart rate 97.  PVCs.  Assessment/Plan Principal Problem:   Symptomatic anemia Active Problems:   Diabetes mellitus, type II (HCC)   Diastolic CHF, chronic (HCC)   GI bleed   Hypokalemia   Symptomatic acute blood loss anemia secondary to suspected occult GI bleed: Hemoglobin 6.2, baseline 8-9.  FOBT positive but no grossly bloody stool noted on rectal exam.  CT abdomen pelvis without obvious source of bleeding.  Colonoscopy done in 2015 with evidence of mild diverticulitis, hemorrhoids, and a polyp (tubular adenoma) which was resected.  Prior EGD done in 2018 showing evidence of severe reflux esophagitis. -1 unit PRBCs ordered.  Follow-up posttransfusion H&H.  Start IV PPI twice daily.  Keep n.p.o. consult GI in  a.m.  Altered mental status: Per nursing staff, patient was more awake and alert upon arrival to the ED.  At the time of my evaluation, he appeared very somnolent and was difficult to arouse.  Upon waking up, he was able to tell me his name but was not oriented to place or time.  Not able to give any history or follow any commands.  Blood glucose was checked and patient was not hypoglycemic.  He has not received any sedating medications.  Has a chronic indwelling Foley catheter and urinalysis with signs of infection. -Order stat head CT, UDS.  Treat possible UTI.  Possible CAUTI: Patient has a chronic indwelling Foley catheter due to history of urinary retention. UA with positive nitrite, large amount of leukocytes, greater than 50 WBCs, and many bacteria.  Urine culture from hospitalization last month grew Pseudomonas and Enterococcus. -Although no fever or leukocytosis, will start cefepime given altered mental status.  Urine culture pending.  ADDENDUM: Concern for sepsis. Patient's blood pressure dropped with systolic in the 29J and rectal temperature 100.1 F.  He had a bowel movement which was grossly nonbloody.  Slightly tachycardic with heart rate in the 90-110 range. -1 L fluid bolus has been ordered.  Lactic acid pending. Blood culture x2 ordered.  Mild hypokalemia -Replete potassium.  Check magnesium level and replete if low.  Continue to monitor electrolytes.  Abnormal LFTs: Transaminases and alk phos mildly elevated.  T bili normal.  -Right upper quadrant ultrasound  Paroxysmal A. fib: On low-dose aspirin but not on anticoagulation due to chronic anemia and history of GI bleed. -Hold aspirin at this time.  Blood pressure currently low with systolic in the 18A, hold Cardizem.  Non-insulin-dependent type 2 diabetes: A1c 8.0 on 07/29/2019. -  Sliding scale insulin sensitive every 4 hours as patient is currently n.p.o.  Chronic diastolic CHF: Chest x-ray showing progressive central vascular  congestion without overt edema.  Lower lungs visualized on CT abdomen pelvis showing a small right pleural effusion.  Patient is not tachypneic or hypoxic. -Check BNP  DVT prophylaxis: SCDs Code Status: Full code Family Communication: No family available at this time. Disposition Plan: Status is: Inpatient  Remains inpatient appropriate because:Ongoing diagnostic testing needed not appropriate for outpatient work up, IV treatments appropriate due to intensity of illness or inability to take PO and Inpatient level of care appropriate due to severity of illness   Dispo: The patient is from: SNF              Anticipated d/c is to: SNF              Anticipated d/c date is: > 3 days              Patient currently is not medically stable to d/c.  The medical decision making on this patient was of high complexity and the patient is at high risk for clinical deterioration, therefore this is a level 3 visit.  Shela Leff MD [facility]  If 7PM-7AM, please contact night-coverage [email or web address]  09/24/2019, 5:25 AM

## 2019-09-24 NOTE — Consult Note (Signed)
Referring Provider: Dr. Niel Hummer Primary Care Physician:  Nicolette Bang, DO Primary Gastroenterologist:  Dr. Paulita Fujita [facility])  Reason for Consultation:  Anemia, heme-positive stools  HPI: [name] is a 79 y.o. male with history of Billroth II gastrojejunostomy, reflux esophagitis, A fib (on 81mg  aspirin but no anticoagulation) presenting with anemia and heme-positive stools.  Patient is somnolent and unable to provide any subjective data.  He does not respond to questions.  Per chart review, patient presents with anemia, lethargy, and heme positive stools.  Per ED PAs examination, stool in the rectal vault was brown with no gross blood.  CT today showed moderate stool throughout the colon, most pronounced within the rectal vault which may reflect impaction.  Last EGD 07/11/2016 revealed moderately severe reflux esophagitis, and Moderately severe reflux esophagitis, and the gastrojejunal anastomosis was characterized by extensive linear ulceration and friability.  Last EGD was 12/02/2013 and showed one rectal polyp (bx: tubular adenoma), mild diverticulosis in the left colon, and internal hemorrhoids, though to be the source of minor chronic bleeding.   Past Medical History:  Diagnosis Date  . Allergic rhinitis   . Anemia    takes Ferrous Sulfate daily  . Arthritis    "all over"  . Atrial flutter ([place])   . Balance problem 01/2014  . CAP (community acquired pneumonia) 09/18/2014  . Cervical radiculopathy due to degenerative joint disease of spine   . Chronic coronary artery disease    a. 03/2010 Nonocclusive disease by cath, performed for ST elevations on ECG;  b. 06/2013 Lexi MV: EF 60%, no ischemia.  [name] COPD (chronic obstructive pulmonary disease) ([place])   . Diabetes mellitus type II    takes Metformin and Lantus daily  . Diastolic CHF, chronic ([place])    a. 12/2012 EF 55-60%, diast dysfxn, triv MR, mildly dil LA/RA.  [name] Dyspnea    with pain  . Essential  hypertension    takes Diltiazem daily  . Frail elderly   . GERD (gastroesophageal reflux disease)   . History of blood transfusion 1982   "when I had stomach OR"  . History of bronchitis    1998  . History of CVA (cerebrovascular accident)   . History of gastric ulcer   . History of kidney stones   . Hypercholesterolemia    takes Pravastatin daily  . Intrinsic eczema   . Legally blind in left eye, as defined in [place]   . Obesity   . PAF (paroxysmal atrial fibrillation) (HCC)    Recurrent after atrial flutter (a. 07/2010 Status post caval tricuspid isthmus ablation by Dr. Midge Aver Metoprolol daily), currently controlled on flecainide plus diltiazem and Coumadin  . Urinary retention   . Vitamin D deficiency   . Weakness    numbness and tingling both hands    Past Surgical History:  Procedure Laterality Date  . ATRIAL ABLATION SURGERY  08/05/10   CTI ablation for atrial flutter by [facility]  . CARDIAC CATHETERIZATION  2012   nl LV function, no occlusive CAD, PAF  . CARDIOVERSION  12/07/2010    Successful direct current cardioversion with atrial fibrillation to normal sinus rhythm  . CARPAL TUNNEL RELEASE Bilateral 01/30/2014   Procedure: BILATERAL CARPAL TUNNEL RELEASE;  Surgeon: Marianna Payment, MD;  Location: [facility];  Service: Orthopedics;  Laterality: Bilateral;  . CATARACT EXTRACTION W/ INTRAOCULAR LENS  IMPLANT, BILATERAL Bilateral   . COLONOSCOPY [name] 12/02/2013   Procedure: COLONOSCOPY;  Surgeon: Irene Shipper, MD;  Location: [facility];  Service: Endoscopy;  Laterality: [name];  . ESOPHAGOGASTRODUODENOSCOPY [name] 09/22/2014   Procedure: ESOPHAGOGASTRODUODENOSCOPY (EGD);  Surgeon: Ronald Lobo, MD;  Location: [facility];  Service: Endoscopy;  Laterality: [name];  . ESOPHAGOGASTRODUODENOSCOPY [name] 07/11/2016   Procedure: ESOPHAGOGASTRODUODENOSCOPY (EGD);  Surgeon: Doran Stabler, MD;  Location[facility] ENDOSCOPY;  Service: Endoscopy;  Laterality: [name];  . EXTRACORPOREAL SHOCK WAVE  LITHOTRIPSY Right 07/15/2018   Procedure: EXTRACORPOREAL SHOCK WAVE LITHOTRIPSY (ESWL);  Surgeon: Lucas Mallow, MD;  Location: [facility];  Service: Urology;  Laterality: Right;  . INCISION AND DRAINAGE ABSCESS / HEMATOMA OF BURSA / KNEE / THIGH Left 1998   knee  . KNEE BURSECTOMY Left 1998  . LAPAROSCOPIC CHOLECYSTECTOMY  03/2010  . LEFT HEART CATH AND CORONARY ANGIOGRAPHY [name] 10/05/2017   Procedure: LEFT HEART CATH AND CORONARY ANGIOGRAPHY;  Surgeon: Martinique, Peter M, MD;  Location: [place] [facility];  Service: Cardiovascular;  Laterality: [name];  . NM MYOVIEW LTD  07/22/2013   Normal EF ~60%, no ischemia or infarction.  [name] PACEMAKER IMPLANT [name] 10/21/2018   Procedure: PACEMAKER IMPLANT;  Surgeon: Deboraha Sprang, MD;  Location: [facility];  Service: Cardiovascular;  Laterality: [name];  . PARTIAL GASTRECTOMY  1982   subtotal; "took out 30% for ulcers"  . TRANSTHORACIC ECHOCARDIOGRAM  02/16/2014   EF 60%, no RWMA. - otherwise normal  . YAG LASER APPLICATION Bilateral     Prior to Admission medications   Medication Sig Start Date End Date Taking? Authorizing Provider  acetaminophen (TYLENOL) 650 MG CR tablet Take 1,300 mg by mouth at bedtime. FOR LEFT SIDE PAIN (DO NOT CRUSH) 08/14/19  Yes [provider]  aspirin EC 81 MG tablet Take 81 mg by mouth daily.   Yes [provider]  bisacodyl (DULCOLAX) 10 MG suppository If not relieved by MOM, give 10 mg Bisacodyl suppositiory rectally X 1 dose in 24 hours as needed (Do not use constipation standing orders for residents with renal failure/CFR less than 30. Contact MD for orders) (Physician Order) 08/06/19  Yes [provider]  citalopram (CELEXA) 20 MG tablet Take 1 tablet (20 mg total) by mouth daily. 10/02/18  Yes Scot Jun, FNP  diltiazem (CARDIZEM CD) 120 MG 24 hr capsule Take 1 capsule (120 mg total) by mouth 2 (two) times daily. 04/07/19  Yes Camnitz, Will Hassell Done, MD  ferrous sulfate 325 (65 FE) MG tablet  Take 325 mg by mouth daily with breakfast.   Yes [provider]  gabapentin (NEURONTIN) 100 MG capsule Take 1 capsule (100 mg total) by mouth at bedtime. 04/22/19  Yes Ladell Pier, MD  glimepiride (AMARYL) 1 MG tablet Take 1 mg by mouth daily before breakfast. 09/15/19  Yes [provider]  Glucerna (GLUCERNA) LIQD Take 237 mLs by mouth daily. Risk of malnutrition. 08/06/19  Yes [provider]  magnesium hydroxide (MILK OF MAGNESIA) 400 MG/5ML suspension If no BM in 3 days, give 30 cc Milk of Magnesium p.o. x 1 dose in 24 hours as needed (Do not use standing constipation orders for residents with renal failure CFR less than 30. Contact MD for orders) (Physician Order) 08/06/19  Yes [provider]  nitroGLYCERIN (NITROSTAT) 0.4 MG SL tablet Place 1 tablet (0.4 mg total) under the tongue every 5 (five) minutes x 3 doses as needed for chest pain. 11/19/15  Yes Kilroy, Luke K, PA-C  pantoprazole (PROTONIX) 40 MG tablet Take 1 tablet (40 mg total) by mouth 2 (two) times daily before  a meal. Dx: K21.9 07/01/19  Yes Nicolette Bang, DO  pravastatin (PRAVACHOL) 40 MG tablet Take 1 tablet (40 mg total) by mouth every evening. 07/01/19  Yes Nicolette Bang, DO  senna-docusate (SENOKOT-S) 8.6-50 MG tablet Take 1 tablet by mouth 2 (two) times daily as needed for mild constipation or moderate constipation. 08/05/19  Yes Little Ishikawa, MD  Sodium Phosphates (RA SALINE ENEMA RE) If not relieved by Biscodyl suppository, give disposable Saline Enema rectally X 1 dose/24 hrs as needed (Do not use constipation standing orders for residents with renal failure/CFR less than 30. Contact MD for orders)(Physician Or 08/06/19  Yes [provider]  tamsulosin (FLOMAX) 0.4 MG CAPS capsule Take 1 capsule (0.4 mg total) by mouth daily. 10/02/18  Yes Scot Jun, FNP  NON FORMULARY DIET :Regular NAS Heart healthy CCD 08/05/19   [provider]     Scheduled Meds: . insulin aspart  0-9 Units Subcutaneous Q4H  . pantoprazole (PROTONIX) IV  40 mg Intravenous Q12H  . potassium chloride  40 mEq Oral Once  . tamsulosin  0.4 mg Oral Daily   Continuous Infusions: . sodium chloride 75 mL/hr at 09/24/19 0921  . ceFEPime (MAXIPIME) IV Stopped (09/24/19 2878)   PRN Meds:.acetaminophen **OR** acetaminophen  Allergies as of 09/23/2019 - Review Complete 09/23/2019  Allergen Reaction Noted  . Cyclobenzaprine Other (See Comments) 10/22/2016  . Other Other (See Comments) 10/19/2018    Family History  Problem Relation Age of Onset  . Breast cancer Mother   . Diabetes Mother   . Kidney disease Mother   . Heart attack Father   . Heart disease Father   . Heart attack Brother   . Leukemia Daughter     Social History   Socioeconomic History  . Marital status: Widowed    Spouse name: Not on file  . Number of children: Not on file  . Years of education: Not on file  . Highest education level: Not on file  Occupational History  . Occupation: Retired  Tobacco Use  . Smoking status: Former Smoker    Packs/day: 0.25    Years: 57.00    Pack years: 14.25    Types: Cigarettes  . Smokeless tobacco: Never Used  . Tobacco comment: He's been smoking between 0.5 and 2 ppd since age 6. QUIT 3 MONTHS AGO  Vaping Use  . Vaping Use: Never used  Substance and Sexual Activity  . Alcohol use: No    Alcohol/week: 0.0 standard drinks    Comment: "quit drinking in 1986"  . Drug use: No  . Sexual activity: Not Currently  Other Topics Concern  . Not on file  Social History Narrative   He is a widower, who moved to [place] in 2011 to live closer to his daughter. He formerly lived in [place]. He is a father of 18, grandfather, and great-grandfather of 24. Does not drink alcohol, context of smoker.   Social Determinants of Health   Financial Resource Strain:   . Difficulty of Paying Living Expenses:   Food Insecurity:   . Worried  About Charity fundraiser in the Last Year:   . Arboriculturist in the Last Year:   Transportation Needs:   . Film/video editor (Medical):   [name] Lack of Transportation (Non-Medical):   Physical Activity:   . Days of Exercise per Week:   . Minutes of Exercise per Session:   Stress:   . Feeling of Stress :  Social Connections:   . Frequency of Communication with Friends and Family:   . Frequency of Social Gatherings with Friends and Family:   . Attends Religious Services:   . Active Member of Clubs or Organizations:   . Attends Archivist Meetings:   [name] Marital Status:   Intimate Partner Violence:   . Fear of Current or Ex-Partner:   . Emotionally Abused:   [name] Physically Abused:   . Sexually Abused:     Review of Systems: Limited by patient status (somnolence).  Physical Exam: Vital signs: Vitals:   09/24/19 1000 09/24/19 1100  BP: (!) 115/54 (!) 102/53  Pulse: 98 94  Resp: 17 17  Temp:    SpO2: 96% 98%     Physical Exam Constitutional:      General: He is not in acute distress.    Appearance: He is normal weight.  HENT:     Head: Normocephalic and atraumatic.     Nose: Nose normal.     Mouth/Throat:     Mouth: Mucous membranes are moist.     Pharynx: Oropharynx is clear.  Eyes:     General: No scleral icterus.    Extraocular Movements: Extraocular movements intact.     Comments: Conjunctival pallor  Cardiovascular:     Rate and Rhythm: Normal rate. Rhythm irregular.     Pulses: Normal pulses.     Heart sounds: Normal heart sounds.  Pulmonary:     Effort: Pulmonary effort is normal. No respiratory distress.     Breath sounds: Normal breath sounds.  Abdominal:     General: Bowel sounds are normal. There is no distension.     Palpations: Abdomen is soft. There is no mass.     Tenderness: There is no abdominal tenderness. There is no guarding or rebound.     Hernia: No hernia is present.  Musculoskeletal:        General: No swelling or  tenderness.     Cervical back: Normal range of motion and neck supple.     Right lower leg: No edema.     Left lower leg: No edema.  Skin:    General: Skin is warm and dry.  Neurological:     Mental Status: He is unresponsive.     Comments: Somnolent, unable to assess orientation or neuro status  Psychiatric:     Comments: Unable to assess mood/behavior due to somnolence      GI:  Lab Results: Recent Labs    09/23/19 2054 09/24/19 0559 09/24/19 1020  WBC 4.8 4.9  --   HGB 6.2* 7.4* 7.8*  HCT 20.5* 24.5* 23.0*  PLT 111* 123*  --    BMET Recent Labs    09/23/19 2054 09/24/19 0559 09/24/19 1020  NA 136 136 141  K 3.2* 3.3* 2.9*  CL 104 106  --   CO2 20* 16*  --   GLUCOSE 106* 104*  --   BUN 15 16  --   CREATININE 1.41* 1.71*  --   CALCIUM 9.5 9.8  --    LFT Recent Labs    09/24/19 0559  PROT 8.8*  ALBUMIN 1.8*  AST 98*  ALT 48*  ALKPHOS 161*  BILITOT 1.4*   PT/INR No results for input(s): LABPROT, INR in the last 72 hours.   Studies/Results: CT HEAD WO CONTRAST  Result Date: 09/24/2019 CLINICAL DATA:  Altered mental status EXAM: CT HEAD WITHOUT CONTRAST TECHNIQUE: Contiguous axial images were obtained from the base of the  skull through the vertex without intravenous contrast. COMPARISON:  07/28/2019 FINDINGS: Brain: There is no acute intracranial hemorrhage, mass effect, or edema. Gray-white differentiation is preserved. There is no extra-axial fluid collection. Prominence of the ventricles and sulci reflects stable parenchymal volume loss. There are small chronic infarcts of the right caudate, corona radiata, and left cerebellum. Additional patchy and confluent areas of hypoattenuation in the supratentorial white matter are nonspecific but probably reflects stable moderate chronic microvascular ischemic changes. Vascular: There is atherosclerotic calcification at the skull base. Skull: Calvarium is unremarkable. Sinuses/Orbits: Mild mucosal thickening.  Bilateral lens replacements. Other: Mastoid air cells are clear. IMPRESSION: No acute intracranial hemorrhage, mass effect, or evidence of acute infarction. Stable chronic findings detailed above. Electronically Signed   By: Macy Mis M.D.   On: 09/24/2019 08:08   CT ABDOMEN PELVIS W CONTRAST  Result Date: 09/24/2019 CLINICAL DATA:  Weakness, anemia EXAM: CT ABDOMEN AND PELVIS WITH CONTRAST TECHNIQUE: Multidetector CT imaging of the abdomen and pelvis was performed using the standard protocol following bolus administration of intravenous contrast. CONTRAST:  135mL OMNIPAQUE IOHEXOL 300 MG/ML  SOLN COMPARISON:  08/03/2019 FINDINGS: Lower chest: There is a small right pleural effusion volume estimated less than 1 L. minimal dependent atelectasis within the lower lobes. Hepatobiliary: Gallbladder is surgically absent. Slight increase in periportal edema since prior study. This is nonspecific. The remainder of the liver is unremarkable. Pancreas: Unremarkable. No pancreatic ductal dilatation or surrounding inflammatory changes. Spleen: Normal in size without focal abnormality. Adrenals/Urinary Tract: The kidneys enhance normally and symmetrically. No urinary tract calculi or obstruction. The adrenals are normal. Bladder is decompressed with a Foley catheter. Stomach/Bowel: No bowel obstruction or ileus. Moderate stool throughout the colon, most pronounced within the rectal vault which may reflect impaction. Normal appendix right lower quadrant. Vascular/Lymphatic: Aortic atherosclerosis. No enlarged abdominal or pelvic lymph nodes. Reproductive: Prostate is unremarkable. Other: Haziness of the central mesenteric fat at the level of the celiac axis is unchanged. No free fluid or free gas. No abdominal wall hernia. Musculoskeletal: No acute or destructive bony lesions. Reconstructed images demonstrate no additional findings. IMPRESSION: 1. Small right pleural effusion volume estimated less than 1 L. 2. Moderate  stool throughout the colon, most pronounced within the rectal vault which may reflect impaction. 3. Slight increase in periportal edema since prior study. This is nonspecific. 4. Aortic Atherosclerosis (ICD10-I70.0). Electronically Signed   By: Randa Ngo M.D.   On: 09/24/2019 02:52   DG Chest Port 1 View  Result Date: 09/24/2019 CLINICAL DATA:  Weakness EXAM: PORTABLE CHEST 1 VIEW COMPARISON:  07/28/2019 FINDINGS: Single frontal view of the chest demonstrates stable dual lead pacemaker. Cardiac silhouette is mildly enlarged. There is chronic central vascular congestion, which has progressed slightly on this exam. No airspace disease, effusion, or pneumothorax. No acute bony abnormalities. IMPRESSION: 1. Progressive central vascular congestion without overt edema. Electronically Signed   By: Randa Ngo M.D.   On: 09/24/2019 00:55   US Abdomen Limited RUQ  Result Date: 09/24/2019 CLINICAL DATA:  Elevated liver function studies. EXAM: ULTRASOUND ABDOMEN LIMITED RIGHT UPPER QUADRANT COMPARISON:  CT scan 09/24/2019 FINDINGS: Gallbladder: Surgically absent. Common bile duct: Diameter: 5.8 mm Liver: Normal echogenicity. No focal hepatic lesions. Portal vein is patent on color Doppler imaging with normal direction of blood flow towards the liver. Other: Right pleural effusion noted. IMPRESSION: 1. Status post cholecystectomy.  No biliary dilatation 2. Unremarkable sonographic appearance of the liver. 3. Right pleural effusion. Electronically Signed   By: Mamie Nick.  Gallerani M.D.   On: 09/24/2019 06:56    Impression: Acute on chronic anemia and heme positive stools -Hgb 6.2 yesterday, decreased from 8-9 range as of 07/2019.  Today, Hgb 7.4 after 1u pRBCs -BUN 16/Cr 1.71 -Ferritin normal (47), iron decreased to 36 with saturation decreased to 11%  Plan: Patient has chronic ulceration of anastomosis with chronic bleeding and anemia.  Case discussed with Dr. Paulita Fujita, who is seen the patient in the office. At  this time, he does not think there is utility in repeating endoscopic or colonoscopic evaluation, as the patient is not having any frank bleeding and has known chronic bleeding from anastomosis site.  Recommend IV Protonix 40 mg twice daily.  Recommend continuation of Protonix 40 mg twice daily twice daily for 2 months at time of discharge, followed by once daily dosing indefinitely.  Recommend iron supplementation.  Continue to monitor H&H with transfusion as needed to maintain hemoglobin greater than 7-8.  [facility] will sign off.  Please contact us if we can be of any further assistance during this hospital stay.    LOS: 0 days   Salley Slaughter  PA-C 09/24/2019, 11:41 AM  Contact #  [phone]

## 2019-09-24 NOTE — Progress Notes (Signed)
Patient just transferred to 4E14 and received and endorsed to RN Tie P. Appropriately.  VSS, still difficult to arouse.

## 2019-09-24 NOTE — ED Notes (Signed)
Patient transported to us 

## 2019-09-24 NOTE — ED Notes (Signed)
Per MD Regalado she would like to hold oral medications

## 2019-09-24 NOTE — Significant Event (Signed)
Rapid Response Event Note  Overview: "Second Set of Eyes" - Increased HR, Decreased LOC  Initial Focused Assessment: Nursing staff called with concerns of patient's HR being in the 200s and patient was hard to arouse as well. Upon my arrival, patient's HR was in the 90 AF - EKG confirmed it, TRH MD was at the bedside. Patient did respond to voice, answered some questions, but quickly fell asleep - he does localize to pain in all extremities. Currently protecting his airway, has good cough reflex. Blood sugar was 123. Lung sounds - clear throughout, abdomen - soft and non-tender, + 2 radial pulses, good capillary refill, and good veins - easy to access. Warm to touch - per nursing temperature was 100.2, MD ordered APAP IV x 1 and a NS 500 cc bolus. Also a Cardizem Infusion was ordered, patient does take Cardizem at home, does have a pacemaker as well  VS: Temp 98.5 (O), BP 116/59 (76), HR 84, 100% on 2L [place], RR 18-20.   Interventions: -- Placed 3rd PIV -- NS 500 cc bolus x 1 -- APAP IV x 1 -- Diltiazem Infusion ordered but on hold, HR controlled 80s-90s AF -- K+ replacement ordered by MD and RN started infusion  Plan of Care: -- Transfer to PCU when bed is available -- Monitor and trend VS -- Assess neurologic status closely -- Aspiration Precautions - high risk for aspiration given patient's mental status - keep HOB elevated, suction at the bedside, etc. -- Delirium Precautions  -- Rest per medical team   Event Summary:  Call Time 1804 Arrival Time 1806 End Time - Pending   [name] R

## 2019-09-25 ENCOUNTER — Inpatient Hospital Stay (HOSPITAL_COMMUNITY): Payer: Medicare Other

## 2019-09-25 DIAGNOSIS — D649 Anemia, unspecified: Secondary | ICD-10-CM

## 2019-09-25 DIAGNOSIS — R4182 Altered mental status, unspecified: Secondary | ICD-10-CM

## 2019-09-25 LAB — CBC
HCT: 22.4 % — ABNORMAL LOW (ref 39.0–52.0)
Hemoglobin: 6.6 g/dL — CL (ref 13.0–17.0)
MCH: 27.5 pg (ref 26.0–34.0)
MCHC: 29.5 g/dL — ABNORMAL LOW (ref 30.0–36.0)
MCV: 93.3 fL (ref 80.0–100.0)
Platelets: 84 10*3/uL — ABNORMAL LOW (ref 150–400)
RBC: 2.4 MIL/uL — ABNORMAL LOW (ref 4.22–5.81)
RDW: 25.2 % — ABNORMAL HIGH (ref 11.5–15.5)
WBC: 7.6 10*3/uL (ref 4.0–10.5)
nRBC: 0.3 % — ABNORMAL HIGH (ref 0.0–0.2)

## 2019-09-25 LAB — PREPARE RBC (CROSSMATCH)

## 2019-09-25 LAB — HEPATIC FUNCTION PANEL
ALT: 49 U/L — ABNORMAL HIGH (ref 0–44)
AST: 87 U/L — ABNORMAL HIGH (ref 15–41)
Albumin: 1.6 g/dL — ABNORMAL LOW (ref 3.5–5.0)
Alkaline Phosphatase: 119 U/L (ref 38–126)
Bilirubin, Direct: 0.4 mg/dL — ABNORMAL HIGH (ref 0.0–0.2)
Indirect Bilirubin: 0.8 mg/dL (ref 0.3–0.9)
Total Bilirubin: 1.2 mg/dL (ref 0.3–1.2)
Total Protein: 8.3 g/dL — ABNORMAL HIGH (ref 6.5–8.1)

## 2019-09-25 LAB — BLOOD GAS, ARTERIAL
Acid-base deficit: 5.2 mmol/L — ABNORMAL HIGH (ref 0.0–2.0)
Bicarbonate: 17.8 mmol/L — ABNORMAL LOW (ref 20.0–28.0)
Drawn by: 519031
FIO2: 28
O2 Saturation: 97.2 %
Patient temperature: 36.9
pCO2 arterial: 24.5 mmHg — ABNORMAL LOW (ref 32.0–48.0)
pH, Arterial: 7.473 — ABNORMAL HIGH (ref 7.350–7.450)
pO2, Arterial: 86.4 mmHg (ref 83.0–108.0)

## 2019-09-25 LAB — HEMOGLOBIN AND HEMATOCRIT, BLOOD
HCT: 26.3 % — ABNORMAL LOW (ref 39.0–52.0)
Hemoglobin: 8 g/dL — ABNORMAL LOW (ref 13.0–17.0)

## 2019-09-25 LAB — LACTIC ACID, PLASMA: Lactic Acid, Venous: 1.8 mmol/L (ref 0.5–1.9)

## 2019-09-25 LAB — BASIC METABOLIC PANEL
Anion gap: 8 (ref 5–15)
BUN: 24 mg/dL — ABNORMAL HIGH (ref 8–23)
CO2: 18 mmol/L — ABNORMAL LOW (ref 22–32)
Calcium: 9.5 mg/dL (ref 8.9–10.3)
Chloride: 113 mmol/L — ABNORMAL HIGH (ref 98–111)
Creatinine, Ser: 1.84 mg/dL — ABNORMAL HIGH (ref 0.61–1.24)
GFR calc Af Amer: 39 mL/min — ABNORMAL LOW (ref >60)
GFR calc non Af Amer: 34 mL/min — ABNORMAL LOW (ref >60)
Glucose, Bld: 117 mg/dL — ABNORMAL HIGH (ref 70–99)
Potassium: 3.2 mmol/L — ABNORMAL LOW (ref 3.5–5.1)
Sodium: 139 mmol/L (ref 135–145)

## 2019-09-25 LAB — GLUCOSE, CAPILLARY
Glucose-Capillary: 100 mg/dL — ABNORMAL HIGH (ref 70–99)
Glucose-Capillary: 119 mg/dL — ABNORMAL HIGH (ref 70–99)
Glucose-Capillary: 120 mg/dL — ABNORMAL HIGH (ref 70–99)
Glucose-Capillary: 127 mg/dL — ABNORMAL HIGH (ref 70–99)
Glucose-Capillary: 120 mg/dL — ABNORMAL HIGH (ref 70–99)

## 2019-09-25 LAB — PROCALCITONIN: Procalcitonin: 1.63 ng/mL

## 2019-09-25 LAB — AMMONIA: Ammonia: 86 umol/L — ABNORMAL HIGH (ref 9–35)

## 2019-09-25 IMAGING — CR ABDOMEN - 1 VIEW
2 series · 2 of 2 positions shown · non-contrast
Comparison: Prior KUB 07/12/2018; prior CT abdomen/pelvis
07/10/2018

CLINICAL DATA: 78-year-old male undergoing preoperative evaluation
prior to lithotripsy for right-sided nephrolithiasis

EXAM:
ABDOMEN - 1 VIEW

[t abdomen supine (1 of 2)]
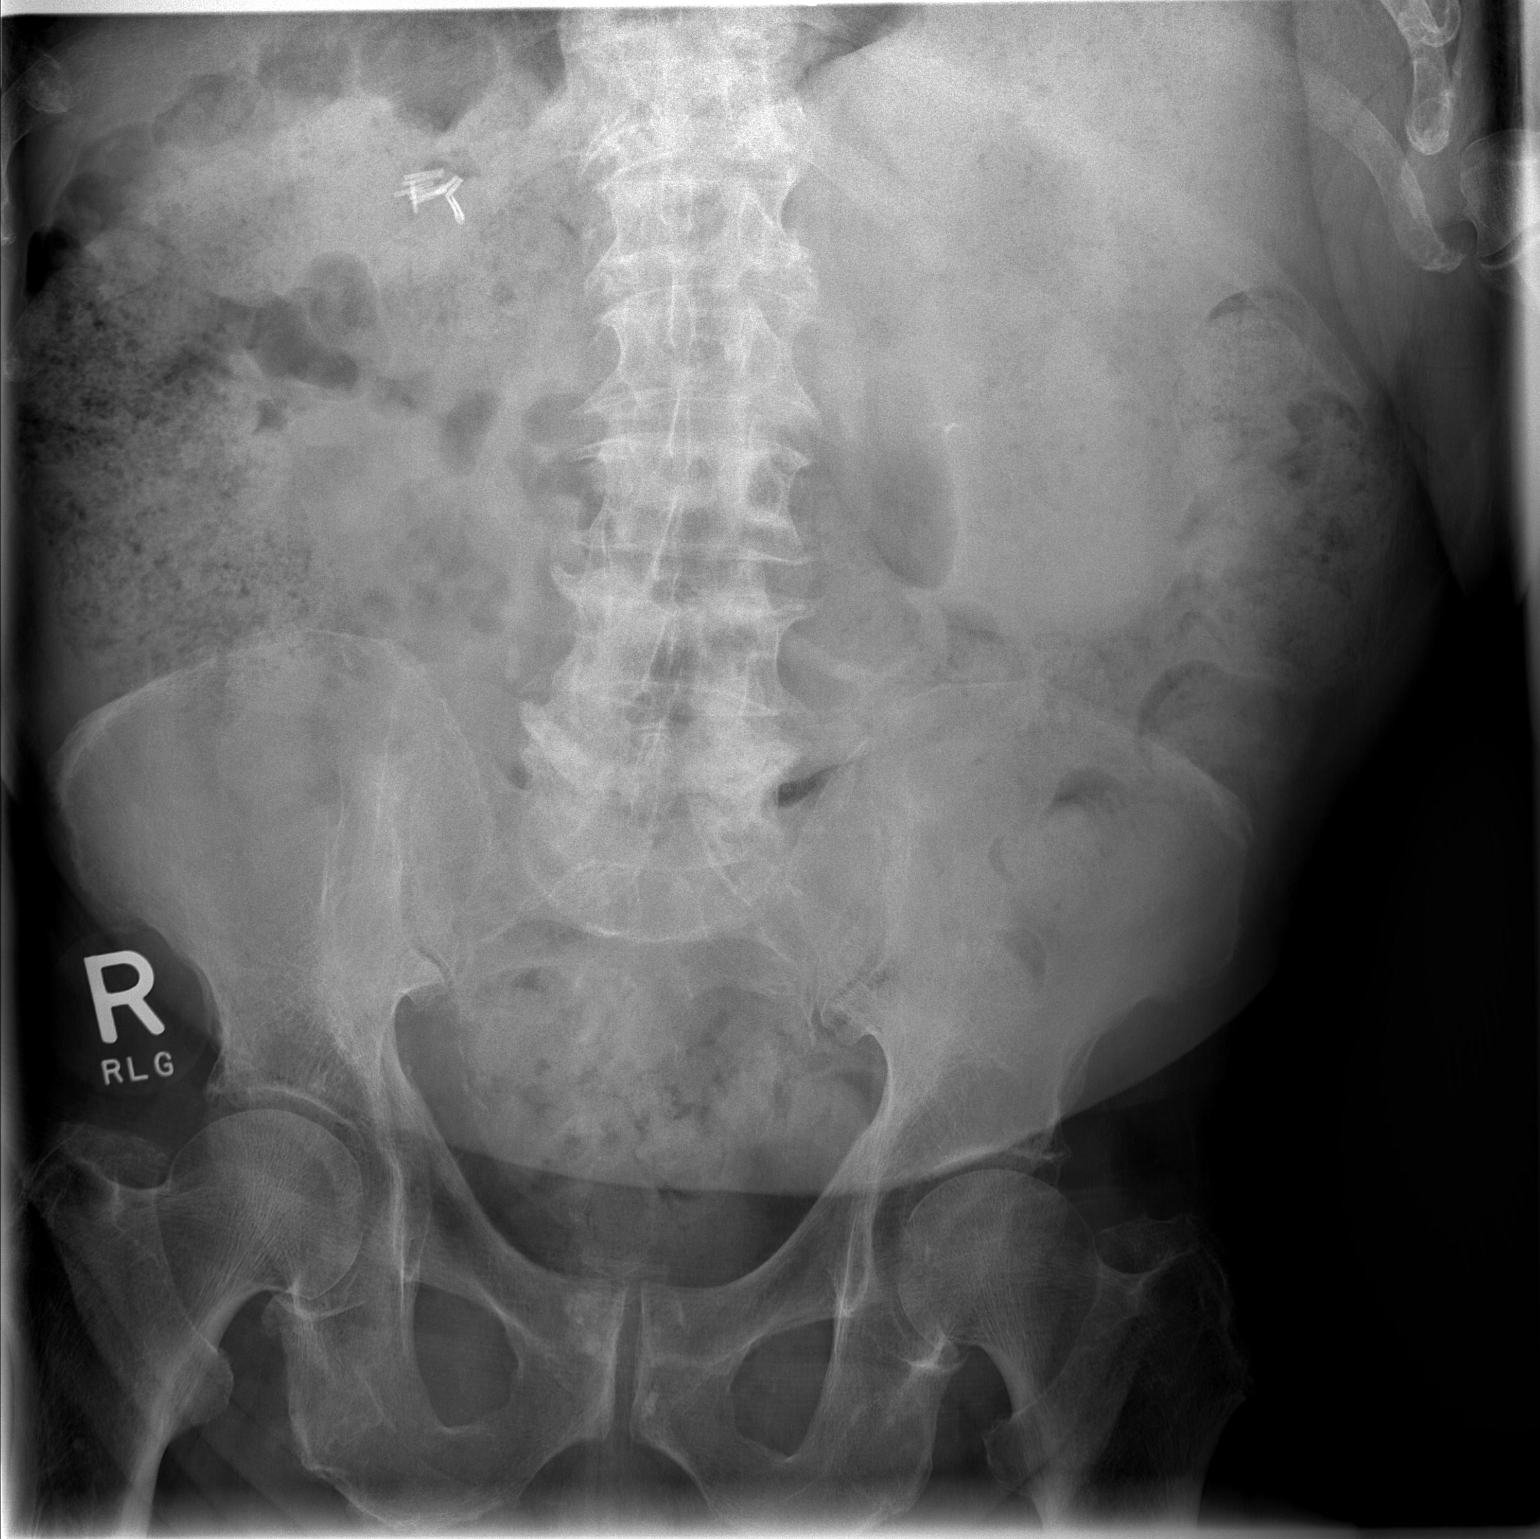

[t abdomen supine (2 of 2)]
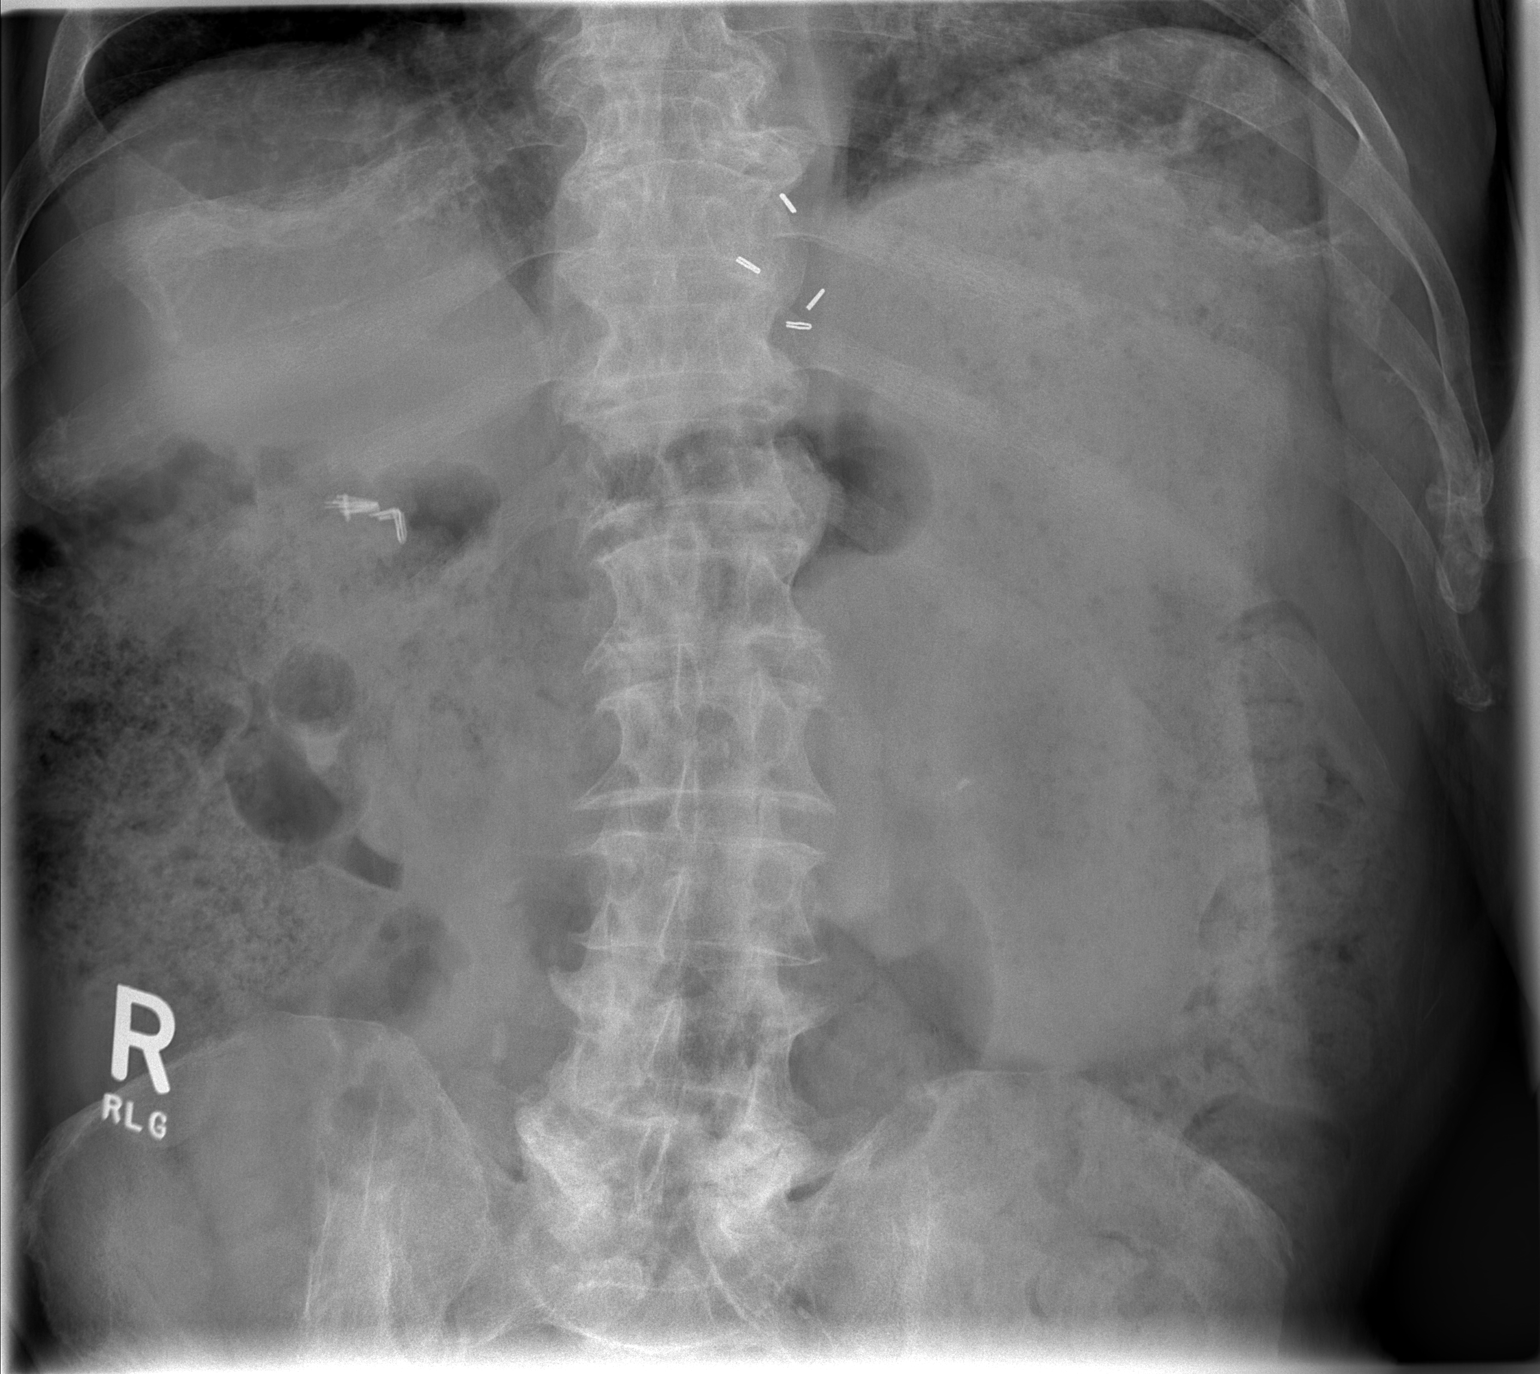

[2 of 2 positions shown; findings below may reference images not displayed]

FINDINGS: Stable size and position of approximately 1 cm stone in the mid
right ureter at the level of L4-L5. A 3 mm stone overlying the
interpolar collecting system of the left renal shadow is also
stable. Surgical clips in the right upper quadrant suggest prior
cholecystectomy. The bowel gas pattern is unremarkable. No acute
osseous abnormality.
IMPRESSION: 1. Stable size and appearance of right ureteral and left renal
stones.

## 2019-09-25 MED ORDER — POTASSIUM CHLORIDE 10 MEQ/100ML IV SOLN
10.0000 meq | INTRAVENOUS | Status: AC
  Administered 2019-09-25 (×3): 10 meq via INTRAVENOUS
  Filled 2019-09-25 (×3): qty 100

## 2019-09-25 MED ORDER — LACTULOSE ENEMA
300.0000 mL | Freq: Two times a day (BID) | ORAL | Status: DC
  Administered 2019-09-25 – 2019-09-26 (×4): 300 mL via RECTAL
  Filled 2019-09-25 (×6): qty 300

## 2019-09-25 MED ORDER — SODIUM CHLORIDE 0.9% IV SOLUTION: Freq: Once | INTRAVENOUS | Status: AC

## 2019-09-25 MED ORDER — POTASSIUM CHLORIDE 10 MEQ/100ML IV SOLN
10.0000 meq | Freq: Once | INTRAVENOUS | Status: AC
  Administered 2019-09-25: 10 meq via INTRAVENOUS
  Filled 2019-09-25: qty 100

## 2019-09-25 NOTE — Progress Notes (Signed)
PROGRESS NOTE    [name]  FYB:[id number] DOB: July 01, 1940 DOA: 09/23/2019 PCP: Nicolette Bang, DO   Brief Narrative: 79 year old with past medical history significant for paroxysmal A. fib on low-dose aspirin but not on anticoagulation due to chronic anemia history of GI bleed, CAD, COPD, CVA, diabetes type 2, chronic diastolic heart failure, GERD history of gastric ulcer, urinary retention with chronic indwelling Foley catheter who presents from rehab for evaluation of generalized weakness and anemia.  Labs drawn at the facility show worsening anemia hemoglobin of 6.2.  Patient was very somnolent and difficult to arouse on admission.  He was able to say his name, say a few words.  Patient admitted with acute metabolic and cephalopathy, anemia, metabolic acidosis, increased ammonia level. He continued to be more lethargic today CCM consulted.    Assessment & Plan:   Principal Problem:   Symptomatic anemia Active Problems:   Diabetes mellitus, type II (HCC)   Diastolic CHF, chronic (HCC)   GI bleed   Hypokalemia   1-Symptomatic acute blood loss anemia presumed to occult GI bleed: 70 hemoglobin of 6.  At baseline 8-9 Occult blood positive. Received 1 unit of packed red blood cell 6-30. CT with presumed fecal impaction.  Tapwater enema ordered GI has been consulted. Hemoglobin down to 6, plan to transfuse another unit. Dated by GI, not a candidate for endoscopy.  Continue with IV Protonix  2-Acute metabolic encephalopathy: Patient presented with altered mental status.  Keep eyes closed, say a few words. ABG pH with respiratory alkalosis.  Metabolic acidosis on the vent Support care Protecting airway Ammonia level elevated at 88 today.  Will schedule twice a day lactulose per rectum Neurology consulted. MRI , EEG ordered.  ABG repeated.   3-Early sepsis: Possible CAUTI; patient has chronic Foley catheter due to urinary retention  Patient febrile  hypotensive. Continue with fluids.  IV antibiotics. Continue With cefepime. With more than 50 white blood cell.  Follow cultures  4-Transaminases: Right upper quadrant ultrasound; Status post cholecystectomy. No biliary dilatation. Unremarkable sonographic appearance of the liver.  Paroxysmal A. fib holding aspirin due to concern for GI bleed  Diabetes: Continue with sliding scale insulin Chronic diastolic heart failure: Chest x-ray with progressive central vascular congestion.  Monitor for pulmonary edema on IVF.   Estimated body mass index is 25.01 kg/m as calculated from the following:   Height as of 09/22/19: 5\' 6"  (1.676 m).   Weight as of this encounter: 70.3 kg.   DVT prophylaxis: SCDs no anticoagulation due to GI bleed Code Status: Full code Family Communication: Care discussed with daughter over the phone on explained to her critical condition. Disposition Plan:  Status is: Inpatient  Remains inpatient appropriate because:Hemodynamically unstable   Dispo: The patient is from: SNF              Anticipated d/c is to: SNF              Anticipated d/c date is: 3 days              Patient currently is not medically stable to d/c.  Still lethargic encephalopathic requiring blood transfusion IV antibiotics        Consultants:   CCM  Neurology  Procedures:   EEG  Antimicrobials:  Cefepime  Subjective: Patient is more lethargic today not responsive.  Objective: Vitals:   09/25/19 0500 09/25/19 0600 09/25/19 0700 09/25/19 0739  BP: (!) 115/58 (!) 108/59 (!) 109/53   Pulse: (!) 109 [name])  102 (!) 110   Resp: 20 20 (!) 22   Temp:    98.4 F (36.9 C)  TempSrc:    Axillary  SpO2: 96% 97% 96%   Weight:        Intake/Output Summary (Last 24 hours) at 09/25/2019 0827 Last data filed at 09/25/2019 0417 Gross per 24 hour  Intake 1542.57 ml  Output 1500 ml  Net 42.57 ml   Filed Weights   09/24/19 2042  Weight: 70.3 kg    Examination:  General exam:  Allergic Respiratory system: Clear to auscultation. Respiratory effort normal. Cardiovascular system: S1 & S2 heard, RRR. No JVD, murmurs, rubs, gallops or clicks. No pedal edema. Gastrointestinal system: Abdomen is nondistended, soft and nontender. No organomegaly or masses felt. Normal bowel sounds heard. Central nervous system: Lethargic. Extremities: No edema   Data Reviewed: I have personally reviewed following labs and imaging studies  CBC: Recent Labs  Lab 09/23/19 2054 09/24/19 0559 09/24/19 1020  WBC 4.8 4.9  --   NEUTROABS 2.6  --   --   HGB 6.2* 7.4* 7.8*  HCT 20.5* 24.5* 23.0*  MCV 92.3 91.4  --   PLT 111* 123*  --    Basic Metabolic Panel: Recent Labs  Lab 09/23/19 2054 09/24/19 0559 09/24/19 1020  NA 136 136 141  K 3.2* 3.3* 2.9*  CL 104 106  --   CO2 20* 16*  --   GLUCOSE 106* 104*  --   BUN 15 16  --   CREATININE 1.41* 1.71*  --   CALCIUM 9.5 9.8  --   MG  --  1.8  --    GFR: Estimated Creatinine Clearance: 31.1 mL/min (A) (by C-G formula based on SCr of 1.71 mg/dL (H)). Liver Function Tests: Recent Labs  Lab 09/23/19 2054 09/24/19 0559  AST 82* 98*  ALT 50* 48*  ALKPHOS 146* 161*  BILITOT 0.7 1.4*  PROT 9.1* 8.8*  ALBUMIN 1.8* 1.8*   No results for input(s): LIPASE, AMYLASE in the last 168 hours. Recent Labs  Lab 09/24/19 1823  AMMONIA 66*   Coagulation Profile: No results for input(s): INR, PROTIME in the last 168 hours. Cardiac Enzymes: No results for input(s): CKTOTAL, CKMB, CKMBINDEX, TROPONINI in the last 168 hours. BNP (last 3 results) No results for input(s): PROBNP in the last 8760 hours. HbA1C: No results for input(s): HGBA1C in the last 72 hours. CBG: Recent Labs  Lab 09/24/19 1820 09/24/19 2000 09/24/19 2349 09/25/19 0418 09/25/19 0812  GLUCAP 123* 111* 87 100* 119*   Lipid Profile: No results for input(s): CHOL, HDL, LDLCALC, TRIG, CHOLHDL, LDLDIRECT in the last 72 hours. Thyroid Function Tests: No results  for input(s): TSH, T4TOTAL, FREET4, T3FREE, THYROIDAB in the last 72 hours. Anemia Panel: Recent Labs    09/24/19 0822  VITAMINB12 854  FOLATE 12.1  FERRITIN 47  TIBC 330  IRON 36*  RETICCTPCT 3.6*   Sepsis Labs: No results for input(s): PROCALCITON, LATICACIDVEN in the last 168 hours.  Recent Results (from the past 240 hour(s))  Urine Culture     Status: Abnormal (Preliminary result)   Collection Time: 09/23/19 11:48 PM   Specimen: Urine, Random  Result Value Ref Range Status   Specimen Description URINE, RANDOM  Final   Special Requests   Final    NONE Performed at [facility], [address], [place], [place] [address]    Culture >=100,000 COLONIES/mL GRAM NEGATIVE RODS (A)  Final   Report Status PENDING  Incomplete  SARS Coronavirus 2 by RT PCR (hospital order, performed in [facility] hospital lab) Nasopharyngeal Nasopharyngeal Swab     Status: None   Collection Time: 09/24/19 12:56 AM   Specimen: Nasopharyngeal Swab  Result Value Ref Range Status   SARS Coronavirus 2 NEGATIVE NEGATIVE Final    Comment: (NOTE) SARS-CoV-2 target nucleic acids are NOT DETECTED.  The SARS-CoV-2 RNA is generally detectable in upper and lower respiratory specimens during the acute phase of infection. The lowest concentration of SARS-CoV-2 viral copies this assay can detect is 250 copies / mL. A negative result does not preclude SARS-CoV-2 infection and should not be used as the sole basis for treatment or other patient management decisions.  A negative result may occur with improper specimen collection / handling, submission of specimen other than nasopharyngeal swab, presence of viral mutation(s) within the areas targeted by this assay, and inadequate number of viral copies (<250 copies / mL). A negative result must be combined with clinical observations, patient history, and epidemiological information.  Fact Sheet for Patients:    StrictlyIdeas.no  Fact Sheet for Healthcare Providers: BankingDealers.co.za  This test is not yet approved or  cleared by the [place] FDA and has been authorized for detection and/or diagnosis of SARS-CoV-2 by FDA under an Emergency Use Authorization (EUA).  This EUA will remain in effect (meaning this test can be used) for the duration of the COVID-19 declaration under Section 564(b)(1) of the Act, 21 U.S.C. section 360bbb-3(b)(1), unless the authorization is terminated or revoked sooner.  Performed at [facility], [place] [address], [place], [place] [address]   Culture, blood (routine x 2)     Status: None (Preliminary result)   Collection Time: 09/24/19  7:12 AM   Specimen: BLOOD LEFT FOREARM  Result Value Ref Range Status   Specimen Description BLOOD LEFT FOREARM  Final   Special Requests   Final    BOTTLES DRAWN AEROBIC ONLY Blood Culture adequate volume   Culture   Final    NO GROWTH < 24 HOURS Performed at [facility], [place] [address], [place], [place] [address]    Report Status PENDING  Incomplete  Culture, blood (routine x 2)     Status: None (Preliminary result)   Collection Time: 09/24/19  8:22 AM   Specimen: BLOOD  Result Value Ref Range Status   Specimen Description BLOOD SITE NOT SPECIFIED  Final   Special Requests   Final    BOTTLES DRAWN AEROBIC AND ANAEROBIC Blood Culture results may not be optimal due to an excessive volume of blood received in culture bottles   Culture   Final    NO GROWTH < 24 HOURS Performed at [facility], [place] [address] [place], [place] [address]    Report Status PENDING  Incomplete         Radiology Studies: CT HEAD WO CONTRAST  Result Date: 09/24/2019 CLINICAL DATA:  Altered mental status EXAM: CT HEAD WITHOUT CONTRAST TECHNIQUE: Contiguous axial images were obtained from the base of the skull through the vertex without intravenous contrast. COMPARISON:   07/28/2019 FINDINGS: Brain: There is no acute intracranial hemorrhage, mass effect, or edema. Gray-white differentiation is preserved. There is no extra-axial fluid collection. Prominence of the ventricles and sulci reflects stable parenchymal volume loss. There are small chronic infarcts of the right caudate, corona radiata, and left cerebellum. Additional patchy and confluent areas of hypoattenuation in the supratentorial white matter are nonspecific but probably reflects stable  moderate chronic microvascular ischemic changes. Vascular: There is atherosclerotic calcification at the skull base. Skull: Calvarium is unremarkable. Sinuses/Orbits: Mild mucosal thickening. Bilateral lens replacements. Other: Mastoid air cells are clear. IMPRESSION: No acute intracranial hemorrhage, mass effect, or evidence of acute infarction. Stable chronic findings detailed above. Electronically Signed   By: Macy Mis M.D.   On: 09/24/2019 08:08   CT ABDOMEN PELVIS W CONTRAST  Result Date: 09/24/2019 CLINICAL DATA:  Weakness, anemia EXAM: CT ABDOMEN AND PELVIS WITH CONTRAST TECHNIQUE: Multidetector CT imaging of the abdomen and pelvis was performed using the standard protocol following bolus administration of intravenous contrast. CONTRAST:  135mL OMNIPAQUE IOHEXOL 300 MG/ML  SOLN COMPARISON:  08/03/2019 FINDINGS: Lower chest: There is a small right pleural effusion volume estimated less than 1 L. minimal dependent atelectasis within the lower lobes. Hepatobiliary: Gallbladder is surgically absent. Slight increase in periportal edema since prior study. This is nonspecific. The remainder of the liver is unremarkable. Pancreas: Unremarkable. No pancreatic ductal dilatation or surrounding inflammatory changes. Spleen: Normal in size without focal abnormality. Adrenals/Urinary Tract: The kidneys enhance normally and symmetrically. No urinary tract calculi or obstruction. The adrenals are normal. Bladder is decompressed with a  Foley catheter. Stomach/Bowel: No bowel obstruction or ileus. Moderate stool throughout the colon, most pronounced within the rectal vault which may reflect impaction. Normal appendix right lower quadrant. Vascular/Lymphatic: Aortic atherosclerosis. No enlarged abdominal or pelvic lymph nodes. Reproductive: Prostate is unremarkable. Other: Haziness of the central mesenteric fat at the level of the celiac axis is unchanged. No free fluid or free gas. No abdominal wall hernia. Musculoskeletal: No acute or destructive bony lesions. Reconstructed images demonstrate no additional findings. IMPRESSION: 1. Small right pleural effusion volume estimated less than 1 L. 2. Moderate stool throughout the colon, most pronounced within the rectal vault which may reflect impaction. 3. Slight increase in periportal edema since prior study. This is nonspecific. 4. Aortic Atherosclerosis (ICD10-I70.0). Electronically Signed   By: Randa Ngo M.D.   On: 09/24/2019 02:52   DG Chest Port 1 View  Result Date: 09/24/2019 CLINICAL DATA:  Weakness EXAM: PORTABLE CHEST 1 VIEW COMPARISON:  07/28/2019 FINDINGS: Single frontal view of the chest demonstrates stable dual lead pacemaker. Cardiac silhouette is mildly enlarged. There is chronic central vascular congestion, which has progressed slightly on this exam. No airspace disease, effusion, or pneumothorax. No acute bony abnormalities. IMPRESSION: 1. Progressive central vascular congestion without overt edema. Electronically Signed   By: Randa Ngo M.D.   On: 09/24/2019 00:55   US Abdomen Limited RUQ  Result Date: 09/24/2019 CLINICAL DATA:  Elevated liver function studies. EXAM: ULTRASOUND ABDOMEN LIMITED RIGHT UPPER QUADRANT COMPARISON:  CT scan 09/24/2019 FINDINGS: Gallbladder: Surgically absent. Common bile duct: Diameter: 5.8 mm Liver: Normal echogenicity. No focal hepatic lesions. Portal vein is patent on color Doppler imaging with normal direction of blood flow towards the  liver. Other: Right pleural effusion noted. IMPRESSION: 1. Status post cholecystectomy.  No biliary dilatation 2. Unremarkable sonographic appearance of the liver. 3. Right pleural effusion. Electronically Signed   By: Marijo Sanes M.D.   On: 09/24/2019 06:56        Scheduled Meds: . Chlorhexidine Gluconate Cloth  6 each Topical Daily  . insulin aspart  0-9 Units Subcutaneous Q4H  . pantoprazole (PROTONIX) IV  40 mg Intravenous Q12H  . potassium chloride  40 mEq Oral Once  . tamsulosin  0.4 mg Oral Daily   Continuous Infusions: . sodium chloride 75 mL/hr at 09/24/19 2154  .  ceFEPime (MAXIPIME) IV 2 g (09/24/19 2157)  . diltiazem (CARDIZEM) infusion Stopped (09/24/19 1848)     LOS: 1 day    Time spent: 35 minutes.     Elmarie Shiley, MD [facility]   If 7PM-7AM, please contact night-coverage [email or web address]  09/25/2019, 8:27 AM

## 2019-09-25 NOTE — Progress Notes (Signed)
[place], RN received patient from ED. Patient arrive to [facility] and was very difficult to arouse. Patient was not following commands and would not respond to questions at this time. Telemetry monitor placed on patient and HR in the 200's, temp 100.2, BP 111/55, 99% on 2L.  [place] Dr. Tyrell Antonio made aware and ordered a STAT EKG, 566mL bolus, and IV tylenol. Puja, RRT notified.   1800 MD and Puja, RRT at bedside. Orders placed to transfer patient to PCU.

## 2019-09-25 NOTE — Progress Notes (Signed)
Pt transferred from 6N to 4E14. Appeared lethargic,drowsy. Unable to follow commands. Responded to localized pain stimulied. He answered a few question, incomprehensibly with eyes closed then felt back to sleep. Pupil responded to light, brisk ,3 mm bilaterally. He's hemodynamically stable at arrival.   Temp 99.9, axillary, BP 103/56 mmHg, HR 91, Atrial fibrillation on monitor, 2nd person verified with CCMD, RR 18, SPO2 98% on 2 LPM of O2 NCL  CHG bath given, Foley care and peri care given. At the tip of his penis appeared to have scant to moderate milky drainage.   Lactulose given per temporaly rectal tube. Pt's unable to tolerate. He pushed lactulose and rectal tube with balloon out with lage brownish and soft stool.   We will continue to monitor.  Kennyth Lose, RN

## 2019-09-25 NOTE — Progress Notes (Signed)
EEG complete - results pending 

## 2019-09-25 NOTE — Consult Note (Signed)
NEURO HOSPITALIST CONSULT NOTE   Requestig physician: Dr. Tyrell Antonio   Reason for Consult: Encephalopathy   History obtained from:   Chart. No family at bedside.   HPI:                                                                                                                                          [name] is an 79 y.o. male with a history of atrial fibrillation, CAD, COPD, CVA, Diabetes Type 2, GERD, chronic diastolic heart failure, GERD, gastric ulcer, urinary retention and chronic foley, who is also legally blind in his left eye. He has a history of anticoagulation with coumadin, but currently is on aspirin only due to chronic anemia and history of GI bleed.   He presented yesterday 09/24/19 to the ED from his rehab facility with generalized weakness and anemia with hemoglobin 6.2. Upon arrival in the ED he was somnolent and difficult to arouse. He was able to say his name, but not able to give history or follow commands.    Admitted for acute blood loss, encephalopathy and sepsis. Acute blood loss was presumed to be due to a GI bleed and the Gastroenterology service was consulted. He had findings consistent with an acute metabolic encephalopathy, most notably with an ammonia of 86. Neurology was consulted, MRI and EEG were ordered. He also has possible sepsis related to chronic foley and is on IV antibiotics. CCM was consulted today for help with management; they suggested altered mental status secondary to sepsis and noted CT head was normal. He did not require ICU care this morning at the time of the critical care consult.  According to a note from 09/22/19, 2 days prior to admission, the patient was appropriately answering all questions at that time. Notes state "pleasant and appropriate does have some cognitive deficits but follows simple verbal commands without difficulty". That note indicates that he "is in a wheelchair appears able to move all extremities x4  with lower extremity weakness".  CT Head yesterday on 6/31/21: IMPRESSION: No acute intracranial hemorrhage, mass effect, or evidence of acute infarction. Stable chronic findings detailed above.   Past Medical History:  Diagnosis Date   Allergic rhinitis    Anemia    takes Ferrous Sulfate daily   Arthritis    "all over"   Atrial flutter ([place])    Balance problem 01/2014   CAP (community acquired pneumonia) 09/18/2014   Cervical radiculopathy due to degenerative joint disease of spine    Chronic coronary artery disease    a. 03/2010 Nonocclusive disease by cath, performed for ST elevations on ECG;  b. 06/2013 Lexi MV: EF 60%, no ischemia.   COPD (chronic obstructive pulmonary disease) (HCC)    Diabetes mellitus type II    takes Metformin  and Lantus daily   Diastolic CHF, chronic ([place])    a. 12/2012 EF 55-60%, diast dysfxn, triv MR, mildly dil LA/RA.   Dyspnea    with pain   Essential hypertension    takes Diltiazem daily   Frail elderly    GERD (gastroesophageal reflux disease)    History of blood transfusion 1982   "when I had stomach OR"   History of bronchitis    1998   History of CVA (cerebrovascular accident)    History of gastric ulcer    History of kidney stones    Hypercholesterolemia    takes Pravastatin daily   Intrinsic eczema    Legally blind in left eye, as defined in [place]    Obesity    PAF (paroxysmal atrial fibrillation) ([place])    Recurrent after atrial flutter (a. 07/2010 Status post caval tricuspid isthmus ablation by Dr. Midge Aver Metoprolol daily), currently controlled on flecainide plus diltiazem and Coumadin   Urinary retention    Vitamin D deficiency    Weakness    numbness and tingling both hands    Past Surgical History:  Procedure Laterality Date   ATRIAL ABLATION SURGERY  08/05/10   CTI ablation for atrial flutter by [facility]  2012   nl LV function, no occlusive CAD, PAF   CARDIOVERSION   12/07/2010    Successful direct current cardioversion with atrial fibrillation to normal sinus rhythm   CARPAL TUNNEL RELEASE Bilateral 01/30/2014   Procedure: BILATERAL CARPAL TUNNEL RELEASE;  Surgeon: Marianna Payment, MD;  Location: [facility];  Service: Orthopedics;  Laterality: Bilateral;   CATARACT EXTRACTION W/ INTRAOCULAR LENS  IMPLANT, BILATERAL Bilateral    COLONOSCOPY [name] 12/02/2013   Procedure: COLONOSCOPY;  Surgeon: Irene Shipper, MD;  Location: [facility];  Service: Endoscopy;  Laterality: [name];   ESOPHAGOGASTRODUODENOSCOPY [name] 09/22/2014   Procedure: ESOPHAGOGASTRODUODENOSCOPY (EGD);  Surgeon: Ronald Lobo, MD;  Location: [facility];  Service: Endoscopy;  Laterality: [name];   ESOPHAGOGASTRODUODENOSCOPY [name] 07/11/2016   Procedure: ESOPHAGOGASTRODUODENOSCOPY (EGD);  Surgeon: Doran Stabler, MD;  Location[facility];  Service: Endoscopy;  Laterality: [name];   EXTRACORPOREAL SHOCK WAVE LITHOTRIPSY Right 07/15/2018   Procedure: EXTRACORPOREAL SHOCK WAVE LITHOTRIPSY (ESWL);  Surgeon: Lucas Mallow, MD;  Location: [facility];  Service: Urology;  Laterality: Right;   INCISION AND DRAINAGE ABSCESS / HEMATOMA OF BURSA / KNEE / THIGH Left 1998   knee   KNEE BURSECTOMY Left 1998   LAPAROSCOPIC CHOLECYSTECTOMY  03/2010   LEFT HEART CATH AND CORONARY ANGIOGRAPHY [name] 10/05/2017   Procedure: LEFT HEART CATH AND CORONARY ANGIOGRAPHY;  Surgeon: Martinique, Peter M, MD;  Location: [facility] CV LAB;  Service: Cardiovascular;  Laterality: [name];   NM MYOVIEW LTD  07/22/2013   Normal EF ~60%, no ischemia or infarction.   PACEMAKER IMPLANT [name] 10/21/2018   Procedure: PACEMAKER IMPLANT;  Surgeon: Deboraha Sprang, MD;  Location: [facility];  Service: Cardiovascular;  Laterality: [name];   PARTIAL GASTRECTOMY  1982   subtotal; "took out 30% for ulcers"   TRANSTHORACIC ECHOCARDIOGRAM  02/16/2014   EF 60%, no RWMA. - otherwise normal   YAG LASER APPLICATION Bilateral     Family History   Problem Relation Age of Onset   Breast cancer Mother    Diabetes Mother    Kidney disease Mother    Heart attack Father    Heart disease Father    Heart attack Brother    Leukemia Daughter  Social History:  reports that he has quit smoking. His smoking use included cigarettes. He has a 14.25 pack-year smoking history. He has never used smokeless tobacco. He reports that he does not drink alcohol and does not use drugs.  Allergies  Allergen Reactions   Cyclobenzaprine Other (See Comments)    Unsteady gait   Other Other (See Comments)    Steroids speeds his heart rate    MEDICATIONS:                                                                                                                     Prior to Admission:  Medications Prior to Admission  Medication Sig Dispense Refill Last Dose   acetaminophen (TYLENOL) 650 MG CR tablet Take 1,300 mg by mouth at bedtime. FOR LEFT SIDE PAIN (DO NOT CRUSH)   09/22/2019 at 2100   aspirin EC 81 MG tablet Take 81 mg by mouth daily.   09/22/2019 at 1000   bisacodyl (DULCOLAX) 10 MG suppository If not relieved by MOM, give 10 mg Bisacodyl suppositiory rectally X 1 dose in 24 hours as needed (Do not use constipation standing orders for residents with renal failure/CFR less than 30. Contact MD for orders) (Physician Order)   unknown   citalopram (CELEXA) 20 MG tablet Take 1 tablet (20 mg total) by mouth daily. 90 tablet 1 09/22/2019 at 1000   diltiazem (CARDIZEM CD) 120 MG 24 hr capsule Take 1 capsule (120 mg total) by mouth 2 (two) times daily. 60 capsule 9 09/22/2019 at 2200   ferrous sulfate 325 (65 FE) MG tablet Take 325 mg by mouth daily with breakfast.   09/22/2019 at 0800   gabapentin (NEURONTIN) 100 MG capsule Take 1 capsule (100 mg total) by mouth at bedtime. 90 capsule 3 09/22/2019 at 2200   glimepiride (AMARYL) 1 MG tablet Take 1 mg by mouth daily before breakfast.   09/22/2019 at 0730   Glucerna (GLUCERNA) LIQD  Take 237 mLs by mouth daily. Risk of malnutrition.   09/23/2019 at 1000   magnesium hydroxide (MILK OF MAGNESIA) 400 MG/5ML suspension If no BM in 3 days, give 30 cc Milk of Magnesium p.o. x 1 dose in 24 hours as needed (Do not use standing constipation orders for residents with renal failure CFR less than 30. Contact MD for orders) (Physician Order)   09/05/2019 at 1028   nitroGLYCERIN (NITROSTAT) 0.4 MG SL tablet Place 1 tablet (0.4 mg total) under the tongue every 5 (five) minutes x 3 doses as needed for chest pain. 25 tablet 9 unknown   pantoprazole (PROTONIX) 40 MG tablet Take 1 tablet (40 mg total) by mouth 2 (two) times daily before a meal. Dx: K21.9 180 tablet 0 09/23/2019 at 1630   pravastatin (PRAVACHOL) 40 MG tablet Take 1 tablet (40 mg total) by mouth every evening. 90 tablet 0 09/22/2019 at 1900   senna-docusate (SENOKOT-S) 8.6-50 MG tablet Take 1 tablet by mouth 2 (two) times daily as needed for mild constipation or moderate  constipation. 30 tablet 0 09/16/2019 at 0853   Sodium Phosphates (RA SALINE ENEMA RE) If not relieved by Biscodyl suppository, give disposable Saline Enema rectally X 1 dose/24 hrs as needed (Do not use constipation standing orders for residents with renal failure/CFR less than 30. Contact MD for orders)(Physician Or   unknown   tamsulosin (FLOMAX) 0.4 MG CAPS capsule Take 1 capsule (0.4 mg total) by mouth daily. 30 capsule 3 09/22/2019 at 1000   NON FORMULARY DIET :Regular NAS Heart healthy CCD      Scheduled:  Chlorhexidine Gluconate Cloth  6 each Topical Daily   insulin aspart  0-9 Units Subcutaneous Q4H   lactulose  300 mL Rectal BID   pantoprazole (PROTONIX) IV  40 mg Intravenous Q12H   potassium chloride  40 mEq Oral Once   tamsulosin  0.4 mg Oral Daily   Continuous:  sodium chloride 75 mL/hr at 09/24/19 2154   ceFEPime (MAXIPIME) IV 2 g (09/25/19 1155)   diltiazem (CARDIZEM) infusion Stopped (09/24/19 1848)     ROS:                                                                                                                                        History obtained from unobtainable from patient due to mental status  Blood pressure (!) 109/53, pulse (!) 110, temperature 98.4 F (36.9 C), temperature source Axillary, resp. rate (!) 22, weight 70.3 kg, SpO2 96 %.   General Examination:                                                                                                       Physical Exam  HEENT-  Normocephalic, no lesions, without obvious abnormality.  Normal external eye and conjunctiva.   Cardiovascular- S1-S2 audible, pulses palpable throughout   Lungs-no rhonchi or wheezing noted, no excessive working breathing.  Saturations within normal limits Abdomen- All 4 quadrants palpated and nontender Extremities- Warm, dry and intact Musculoskeletal-no joint tenderness, deformity or swelling Skin-warm and dry, no hyperpigmentation, vitiligo, or suspicious lesions  Neurological Examination Mental Status: Drowsy, opens eyes to speech. Incomprehensible moaning, only word able to understand is patient repeating "No". Not following any commands.  Cranial Nerves: II: Unable to test visual fields, patient briefly keeps eyes open and then closes them again.  III,IV, VI: ptosis not present. Exotropia; left eye is abducted (history lists he is legally blind in left eye)  V,VII: smile symmetric, unable to test sensation VIII: Opens eyes to  auditory stimuli IX,X: Noncooperative XI: Head is midline XII: Noncooperative Motor: Attempted to lift extremities off of bed and patient would not hold them antigravity. When attempting to move the patient he moaned louder and repeated "no". He did have some spontaneous movement noted in his upper extremities, but not against gravity. He did show resistance in the lower extremities when attempting to lift them from the bed. He did not follow any verbal or demonstrated commands. Increased  tone in BLE also noted.  Sensory: Sensation to tactile stimuli is grossly intact throughout, bilaterally Plantars: Right: downgoing   Left: downgoing Cerebellar and gait: unable to test   Lab Results: Basic Metabolic Panel: Recent Labs  Lab 09/23/19 2054 09/24/19 0559 09/24/19 1020  NA 136 136 141  K 3.2* 3.3* 2.9*  CL 104 106  --   CO2 20* 16*  --   GLUCOSE 106* 104*  --   BUN 15 16  --   CREATININE 1.41* 1.71*  --   CALCIUM 9.5 9.8  --   MG  --  1.8  --     CBC: Recent Labs  Lab 09/23/19 2054 09/24/19 0559 09/24/19 1020  WBC 4.8 4.9  --   NEUTROABS 2.6  --   --   HGB 6.2* 7.4* 7.8*  HCT 20.5* 24.5* 23.0*  MCV 92.3 91.4  --   PLT 111* 123*  --     Cardiac Enzymes: No results for input(s): CKTOTAL, CKMB, CKMBINDEX, TROPONINI in the last 168 hours.  Lipid Panel: No results for input(s): CHOL, TRIG, HDL, CHOLHDL, VLDL, LDLCALC in the last 168 hours.  Imaging: CT HEAD WO CONTRAST  Result Date: 09/24/2019 CLINICAL DATA:  Altered mental status EXAM: CT HEAD WITHOUT CONTRAST TECHNIQUE: Contiguous axial images were obtained from the base of the skull through the vertex without intravenous contrast. COMPARISON:  07/28/2019 FINDINGS: Brain: There is no acute intracranial hemorrhage, mass effect, or edema. Gray-white differentiation is preserved. There is no extra-axial fluid collection. Prominence of the ventricles and sulci reflects stable parenchymal volume loss. There are small chronic infarcts of the right caudate, corona radiata, and left cerebellum. Additional patchy and confluent areas of hypoattenuation in the supratentorial white matter are nonspecific but probably reflects stable moderate chronic microvascular ischemic changes. Vascular: There is atherosclerotic calcification at the skull base. Skull: Calvarium is unremarkable. Sinuses/Orbits: Mild mucosal thickening. Bilateral lens replacements. Other: Mastoid air cells are clear. IMPRESSION: No acute intracranial  hemorrhage, mass effect, or evidence of acute infarction. Stable chronic findings detailed above. Electronically Signed   By: Macy Mis M.D.   On: 09/24/2019 08:08   CT ABDOMEN PELVIS W CONTRAST  Result Date: 09/24/2019 CLINICAL DATA:  Weakness, anemia EXAM: CT ABDOMEN AND PELVIS WITH CONTRAST TECHNIQUE: Multidetector CT imaging of the abdomen and pelvis was performed using the standard protocol following bolus administration of intravenous contrast. CONTRAST:  164mL OMNIPAQUE IOHEXOL 300 MG/ML  SOLN COMPARISON:  08/03/2019 FINDINGS: Lower chest: There is a small right pleural effusion volume estimated less than 1 L. minimal dependent atelectasis within the lower lobes. Hepatobiliary: Gallbladder is surgically absent. Slight increase in periportal edema since prior study. This is nonspecific. The remainder of the liver is unremarkable. Pancreas: Unremarkable. No pancreatic ductal dilatation or surrounding inflammatory changes. Spleen: Normal in size without focal abnormality. Adrenals/Urinary Tract: The kidneys enhance normally and symmetrically. No urinary tract calculi or obstruction. The adrenals are normal. Bladder is decompressed with a Foley catheter. Stomach/Bowel: No bowel obstruction or ileus. Moderate stool throughout the  colon, most pronounced within the rectal vault which may reflect impaction. Normal appendix right lower quadrant. Vascular/Lymphatic: Aortic atherosclerosis. No enlarged abdominal or pelvic lymph nodes. Reproductive: Prostate is unremarkable. Other: Haziness of the central mesenteric fat at the level of the celiac axis is unchanged. No free fluid or free gas. No abdominal wall hernia. Musculoskeletal: No acute or destructive bony lesions. Reconstructed images demonstrate no additional findings. IMPRESSION: 1. Small right pleural effusion volume estimated less than 1 L. 2. Moderate stool throughout the colon, most pronounced within the rectal vault which may reflect impaction. 3.  Slight increase in periportal edema since prior study. This is nonspecific. 4. Aortic Atherosclerosis (ICD10-I70.0). Electronically Signed   By: Randa Ngo M.D.   On: 09/24/2019 02:52   DG Chest Port 1 View  Result Date: 09/24/2019 CLINICAL DATA:  Weakness EXAM: PORTABLE CHEST 1 VIEW COMPARISON:  07/28/2019 FINDINGS: Single frontal view of the chest demonstrates stable dual lead pacemaker. Cardiac silhouette is mildly enlarged. There is chronic central vascular congestion, which has progressed slightly on this exam. No airspace disease, effusion, or pneumothorax. No acute bony abnormalities. IMPRESSION: 1. Progressive central vascular congestion without overt edema. Electronically Signed   By: Randa Ngo M.D.   On: 09/24/2019 00:55   US Abdomen Limited RUQ  Result Date: 09/24/2019 CLINICAL DATA:  Elevated liver function studies. EXAM: ULTRASOUND ABDOMEN LIMITED RIGHT UPPER QUADRANT COMPARISON:  CT scan 09/24/2019 FINDINGS: Gallbladder: Surgically absent. Common bile duct: Diameter: 5.8 mm Liver: Normal echogenicity. No focal hepatic lesions. Portal vein is patent on color Doppler imaging with normal direction of blood flow towards the liver. Other: Right pleural effusion noted. IMPRESSION: 1. Status post cholecystectomy.  No biliary dilatation 2. Unremarkable sonographic appearance of the liver. 3. Right pleural effusion. Electronically Signed   By: Marijo Sanes M.D.   On: 09/24/2019 06:56    Assessment: 79 year old male with acute encephalopathy 1. Exam reveals lethargic patient with brief eye opening to voice. Incompressible moaning with only understandable word "no". Left eye exotropia noted, history lists that he is legally blind in his left eye. Some spontaneous movement of extremities noted, but not to gravity. Patient did not hold extremities up when lifted. Not following any verbal or demonstrated commands. No adventitious movements suggestive of clinical seizures are seen.  2. CT head:  No acute intracranial hemorrhage, mass effect, or evidence of acute infarction. Stable chronic ischemic changes and mild to moderate atrophy.  3. Metabolic derangements include the following: Hyperammonemia (86), abnormal ABG, hypokalemia, low bicarb level, elevated BUN and Cr, mildly elevated transaminases, elevated direct bilirubin. Urine toxicology screen normal.  4. Hematology labs: White count is normal. Most recent Hgb is low at 6.6. Also with thrombocytopenia. Fecal occult blood positive. Also with low serum iron level. B12 level is normal. 5. Urinalysis and urine culture are consistent with UTI.  6. DDx includes a hypoactive delirium due to toxic/metabolic/infectious etiology, with hyperammonemia due to GIB being the most likely etiology, in conjunction with infection. A stroke not detectable on initial CT is also possible - will need to call the MRI techs with model # of the patient's pacemaker to determine if it is MRI compatible.   Recommendations:  1. Thiamine level,  2. EEG completed with results pending 3. MRI Pending: clarify if pacemaker is compatible  4. Treatment of UTI, hyperammonemia and other metabolic derangements.    Electronically signed: Dr. Kerney Elbe 09/25/2019, 9:25 AM

## 2019-09-25 NOTE — Consult Note (Signed)
NAME:  [name], MRN:  [id number], DOB:  1941/02/18, LOS: 1 ADMISSION DATE:  09/23/2019, CONSULTATION DATE: 09/25/2019 REFERRING MD: [facility]ad, CHIEF COMPLAINT: Altered mental status  Brief History   79 year old with ongoing decline over several months now with anemia, altered mental status, atrial fibrillation ventricular response.  History of present illness   79 year old male has recently been falling down and has been seen multiple times in the emergency room.  Subsequently discharged to [facility] skilled nursing facility where he was noted to have altered mental status and he was transferred to the emergency department [facility].  He was noted to have anemia with a hemoglobin of 6.0 requiring transfusion of 1 packed cells elevating it to 7.0.  CT of the head did not reveal any acute abnormalities.  At the time of my examination he does have a downward gaze preference.  Currently is hemodynamically stable with adequate oxygenation and ventilation but the concern is his downward gaze preference neuro has been consulted for this.  We will leave him in the stepdown unit for now his daughter was updated 09/25/2019.  Should he deteriorate we will transfer to the intensive care unit as he is a full code.   Past Medical History   Past Medical History:  Diagnosis Date  . Allergic rhinitis   . Anemia    takes Ferrous Sulfate daily  . Arthritis    "all over"  . Atrial flutter ([place])   . Balance problem 01/2014  . CAP (community acquired pneumonia) 09/18/2014  . Cervical radiculopathy due to degenerative joint disease of spine   . Chronic coronary artery disease    a. 03/2010 Nonocclusive disease by cath, performed for ST elevations on ECG;  b. 06/2013 Lexi MV: EF 60%, no ischemia.  [name] COPD (chronic obstructive pulmonary disease) ([place])   . Diabetes mellitus type II    takes Metformin and Lantus daily  . Diastolic CHF, chronic ([place])    a. 12/2012 EF 55-60%, diast dysfxn, triv MR, mildly  dil LA/RA.  [name] Dyspnea    with pain  . Essential hypertension    takes Diltiazem daily  . Frail elderly   . GERD (gastroesophageal reflux disease)   . History of blood transfusion 1982   "when I had stomach OR"  . History of bronchitis    1998  . History of CVA (cerebrovascular accident)   . History of gastric ulcer   . History of kidney stones   . Hypercholesterolemia    takes Pravastatin daily  . Intrinsic eczema   . Legally blind in left eye, as defined in [place]   . Obesity   . PAF (paroxysmal atrial fibrillation) (HCC)    Recurrent after atrial flutter (a. 07/2010 Status post caval tricuspid isthmus ablation by Dr. Midge Aver Metoprolol daily), currently controlled on flecainide plus diltiazem and Coumadin  . Urinary retention   . Vitamin D deficiency   . Weakness    numbness and tingling both hands     Significant Hospital Events   Admitted 09/24/2019 with worsening mental status anemia transfer to stepdown unit  Consults:  09/25/2019 PCCM 09/25/2019 neuro  Procedures:    Significant Diagnostic Tests:  Pending lactic acid Pending procalcitonin  Micro Data:  09/24/2019 urine culture>> 09/24/2019 blood culture>>  Antimicrobials:    09/24/2019 Maxipime>> Interim history/subjective:  79 year old male with anemia, altered mental status negative CT of the head but with downward gaze preference  Objective   Blood pressure (!) 109/53, pulse (!) 110, temperature  98.4 F (36.9 C), temperature source Axillary, resp. rate (!) 22, weight 70.3 kg, SpO2 96 %.        Intake/Output Summary (Last 24 hours) at 09/25/2019 0920 Last data filed at 09/25/2019 0417 Gross per 24 hour  Intake 1542.57 ml  Output 1500 ml  Net 42.57 ml   Filed Weights   09/24/19 2042  Weight: 70.3 kg    Examination: General: Morbidly obese male who is obtunded HENT: No lymphadenopathy of or JVD is appreciated.  Edentulous noted to have either an ulcer rotten tooth or molar implant in the lower  jaw Lungs: Coarse rhonchi with respiratory rate of 24 Cardiovascular: Ventricular rate of 114 with atrial fibrillation Abdomen: Soft nontender positive bowel sounds Extremities: Positive edema Neuro: Obtunded except to medical rubs were he grimaces GU: No Foley catheter in place with 30 amber urine  Resolved Hospital Problem list     Assessment & Plan:  Altered mental status most likely multifactorial with elevated ammonia level, chronic anemia with a negative CT of the head but showing downward gaze preference. Repeat ammonia level MRI of the head Neurology consult Avoid sedation Remain in stepdown unit  Acute on chronic anemia with a history of GI bleeds Recheck CBC Monitor for GI bleed Transfuse per protocol Questionable GI consult in future Avoid anticoagulations Twice daily PPI  History of chronic indwelling Foley with findings consistent with UTI on this admission.  Findings consistent with UTI Foley was changed 09/24/2019 Continue current antimicrobial therapy May need to broaden antibiotics a later date Repeat lactic acid Check procalcitonin   History of paroxysmal atrial fibrillation with episode of rapid ventricular response.  Congestive heart failure Currently hemodynamically stable He has been followed by cardiology with ablation in the past .Continue to monitor  Diabetes CBG (last 3)  Recent Labs    09/24/19 2349 09/25/19 0418 09/25/19 0812  GLUCAP 87 100* 119*  Sliding-scale insulin protocol   History of COPD and prolonged tobacco use No need for intubation at this time Bronchodilators Supplemental oxygen   Best practice:  Diet: NPO Pain/Anxiety/Delirium protocol (if indicated): No sedation now due to altered mental status VAP protocol (if indicated): Not indicated DVT prophylaxis: Hold on anticoagulation due to history of GI bleed anemia GI prophylaxis: PPI Glucose control: Sliding scale insulin protocol Mobility bedrest Code Status: Full  code Family Communication: Daughter updated he remains full code for now Disposition: Continue in stepdown for now  Labs   CBC: Recent Labs  Lab 09/23/19 2054 09/24/19 0559 09/24/19 1020  WBC 4.8 4.9  --   NEUTROABS 2.6  --   --   HGB 6.2* 7.4* 7.8*  HCT 20.5* 24.5* 23.0*  MCV 92.3 91.4  --   PLT 111* 123*  --     Basic Metabolic Panel: Recent Labs  Lab 09/23/19 2054 09/24/19 0559 09/24/19 1020  NA 136 136 141  K 3.2* 3.3* 2.9*  CL 104 106  --   CO2 20* 16*  --   GLUCOSE 106* 104*  --   BUN 15 16  --   CREATININE 1.41* 1.71*  --   CALCIUM 9.5 9.8  --   MG  --  1.8  --    GFR: Estimated Creatinine Clearance: 31.1 mL/min (A) (by C-G formula based on SCr of 1.71 mg/dL (H)). Recent Labs  Lab 09/23/19 2054 09/24/19 0559  WBC 4.8 4.9    Liver Function Tests: Recent Labs  Lab 09/23/19 2054 09/24/19 0559  AST 82* 98*  ALT  50* 48*  ALKPHOS 146* 161*  BILITOT 0.7 1.4*  PROT 9.1* 8.8*  ALBUMIN 1.8* 1.8*   No results for input(s): LIPASE, AMYLASE in the last 168 hours. Recent Labs  Lab 09/24/19 1823  AMMONIA 66*    ABG    Component Value Date/Time   PHART 7.483 (H) 09/24/2019 1020   PCO2ART 24.9 (L) 09/24/2019 1020   PO2ART 73 (L) 09/24/2019 1020   HCO3 18.7 (L) 09/24/2019 1020   TCO2 19 (L) 09/24/2019 1020   ACIDBASEDEF 4.0 (H) 09/24/2019 1020   O2SAT 96.0 09/24/2019 1020     Coagulation Profile: No results for input(s): INR, PROTIME in the last 168 hours.  Cardiac Enzymes: No results for input(s): CKTOTAL, CKMB, CKMBINDEX, TROPONINI in the last 168 hours.  HbA1C: Hemoglobin A1C  Date/Time Value Ref Range Status  04/22/2019 09:29 AM 6.4 (A) 4.0 - 5.6 % Final   Hgb A1c MFr Bld  Date/Time Value Ref Range Status  07/29/2019 05:25 AM 8.0 (H) 4.8 - 5.6 % Final    Comment:    (NOTE) Pre diabetes:          5.7%-6.4% Diabetes:              >6.4% Glycemic control for   <7.0% adults with diabetes   10/02/2018 09:33 AM 7.2 (H) 4.8 - 5.6 %  Final    Comment:             Prediabetes: 5.7 - 6.4          Diabetes: >6.4          Glycemic control for adults with diabetes: <7.0     CBG: Recent Labs  Lab 09/24/19 1820 09/24/19 2000 09/24/19 2349 09/25/19 0418 09/25/19 0812  GLUCAP 123* 111* 87 100* 119*    Review of Systems:   na  Past Medical History  He,  has a past medical history of Allergic rhinitis, Anemia, Arthritis, Atrial flutter ([place]), Balance problem (01/2014), CAP (community acquired pneumonia) (09/18/2014), Cervical radiculopathy due to degenerative joint disease of spine, Chronic coronary artery disease, COPD (chronic obstructive pulmonary disease) ([place]), Diabetes mellitus type II, Diastolic CHF, chronic ([place]), Dyspnea, Essential hypertension, Frail elderly, GERD (gastroesophageal reflux disease), History of blood transfusion (1982), History of bronchitis, History of CVA (cerebrovascular accident), History of gastric ulcer, History of kidney stones, Hypercholesterolemia, Intrinsic eczema, Legally blind in left eye, as defined in [place], Obesity, PAF (paroxysmal atrial fibrillation) ([place]), Urinary retention, Vitamin D deficiency, and Weakness.   Surgical History    Past Surgical History:  Procedure Laterality Date  . ATRIAL ABLATION SURGERY  08/05/10   CTI ablation for atrial flutter by JA  . CARDIAC CATHETERIZATION  2012   nl LV function, no occlusive CAD, PAF  . CARDIOVERSION  12/07/2010    Successful direct current cardioversion with atrial fibrillation to normal sinus rhythm  . CARPAL TUNNEL RELEASE Bilateral 01/30/2014   Procedure: BILATERAL CARPAL TUNNEL RELEASE;  Surgeon: Marianna Payment, MD;  Location: [facility];  Service: Orthopedics;  Laterality: Bilateral;  . CATARACT EXTRACTION W/ INTRAOCULAR LENS  IMPLANT, BILATERAL Bilateral   . COLONOSCOPY [name] 12/02/2013   Procedure: COLONOSCOPY;  Surgeon: Irene Shipper, MD;  Location: [facility];  Service: Endoscopy;  Laterality: [name];  . ESOPHAGOGASTRODUODENOSCOPY  [name] 09/22/2014   Procedure: ESOPHAGOGASTRODUODENOSCOPY (EGD);  Surgeon: Ronald Lobo, MD;  Location[facility] ENDOSCOPY;  Service: Endoscopy;  Laterality: [name];  . ESOPHAGOGASTRODUODENOSCOPY [name] 07/11/2016   Procedure: ESOPHAGOGASTRODUODENOSCOPY (EGD);  Surgeon: Doran Stabler, MD;  Location:  [facility] ENDOSCOPY;  Service: Endoscopy;  Laterality: [name];  . EXTRACORPOREAL SHOCK WAVE LITHOTRIPSY Right 07/15/2018   Procedure: EXTRACORPOREAL SHOCK WAVE LITHOTRIPSY (ESWL);  Surgeon: Lucas Mallow, MD;  Location: [facility];  Service: Urology;  Laterality: Right;  . INCISION AND DRAINAGE ABSCESS / HEMATOMA OF BURSA / KNEE / THIGH Left 1998   knee  . KNEE BURSECTOMY Left 1998  . LAPAROSCOPIC CHOLECYSTECTOMY  03/2010  . LEFT HEART CATH AND CORONARY ANGIOGRAPHY [name] 10/05/2017   Procedure: LEFT HEART CATH AND CORONARY ANGIOGRAPHY;  Surgeon: Martinique, Peter M, MD;  Location: [place];  Service: Cardiovascular;  Laterality: [name];  . NM MYOVIEW LTD  07/22/2013   Normal EF ~60%, no ischemia or infarction.  [name] PACEMAKER IMPLANT [name] 10/21/2018   Procedure: PACEMAKER IMPLANT;  Surgeon: Deboraha Sprang, MD;  Location: [place];  Service: Cardiovascular;  Laterality: [name];  . PARTIAL GASTRECTOMY  1982   subtotal; "took out 30% for ulcers"  . TRANSTHORACIC ECHOCARDIOGRAM  02/16/2014   EF 60%, no RWMA. - otherwise normal  . YAG LASER APPLICATION Bilateral      Social History   reports that he has quit smoking. His smoking use included cigarettes. He has a 14.25 pack-year smoking history. He has never used smokeless tobacco. He reports that he does not drink alcohol and does not use drugs.   Family History   His family history includes Breast cancer in his mother; Diabetes in his mother; Heart attack in his brother and father; Heart disease in his father; Kidney disease in his mother; Leukemia in his daughter.   Allergies Allergies  Allergen Reactions  . Cyclobenzaprine Other (See Comments)    Unsteady gait   . Other Other (See Comments)    Steroids speeds his heart rate     Home Medications  Prior to Admission medications   Medication Sig Start Date End Date Taking? Authorizing Provider  acetaminophen (TYLENOL) 650 MG CR tablet Take 1,300 mg by mouth at bedtime. FOR LEFT SIDE PAIN (DO NOT CRUSH) 08/14/19  Yes [provider]  aspirin EC 81 MG tablet Take 81 mg by mouth daily.   Yes [provider]  bisacodyl (DULCOLAX) 10 MG suppository If not relieved by MOM, give 10 mg Bisacodyl suppositiory rectally X 1 dose in 24 hours as needed (Do not use constipation standing orders for residents with renal failure/CFR less than 30. Contact MD for orders) (Physician Order) 08/06/19  Yes [provider]  citalopram (CELEXA) 20 MG tablet Take 1 tablet (20 mg total) by mouth daily. 10/02/18  Yes Scot Jun, FNP  diltiazem (CARDIZEM CD) 120 MG 24 hr capsule Take 1 capsule (120 mg total) by mouth 2 (two) times daily. 04/07/19  Yes Camnitz, Will Hassell Done, MD  ferrous sulfate 325 (65 FE) MG tablet Take 325 mg by mouth daily with breakfast.   Yes [provider]  gabapentin (NEURONTIN) 100 MG capsule Take 1 capsule (100 mg total) by mouth at bedtime. 04/22/19  Yes Ladell Pier, MD  glimepiride (AMARYL) 1 MG tablet Take 1 mg by mouth daily before breakfast. 09/15/19  Yes [provider]  Glucerna (GLUCERNA) LIQD Take 237 mLs by mouth daily. Risk of malnutrition. 08/06/19  Yes [provider]  magnesium hydroxide (MILK OF MAGNESIA) 400 MG/5ML suspension If no BM in 3 days, give 30 cc Milk of Magnesium p.o. x 1 dose in 24 hours as needed (Do not use standing constipation orders for residents with renal  failure CFR less than 30. Contact MD for orders) (Physician Order) 08/06/19  Yes [provider]  nitroGLYCERIN (NITROSTAT) 0.4 MG SL tablet Place 1 tablet (0.4 mg total) under the tongue every 5 (five) minutes x 3 doses as needed for chest pain. 11/19/15  Yes  Kilroy, Luke K, PA-C  pantoprazole (PROTONIX) 40 MG tablet Take 1 tablet (40 mg total) by mouth 2 (two) times daily before a meal. Dx: K21.9 07/01/19  Yes Nicolette Bang, DO  pravastatin (PRAVACHOL) 40 MG tablet Take 1 tablet (40 mg total) by mouth every evening. 07/01/19  Yes Nicolette Bang, DO  senna-docusate (SENOKOT-S) 8.6-50 MG tablet Take 1 tablet by mouth 2 (two) times daily as needed for mild constipation or moderate constipation. 08/05/19  Yes Little Ishikawa, MD  Sodium Phosphates (RA SALINE ENEMA RE) If not relieved by Biscodyl suppository, give disposable Saline Enema rectally X 1 dose/24 hrs as needed (Do not use constipation standing orders for residents with renal failure/CFR less than 30. Contact MD for orders)(Physician Or 08/06/19  Yes [provider]  tamsulosin (FLOMAX) 0.4 MG CAPS capsule Take 1 capsule (0.4 mg total) by mouth daily. 10/02/18  Yes Scot Jun, FNP  NON FORMULARY DIET :Regular NAS Heart healthy CCD 08/05/19   [provider]     Critical care time: 45 min     Richardson Landry Casmer Yepiz ACNP Acute Care Nurse Practitioner [place] Please consult Amion 09/25/2019, 9:21 AM

## 2019-09-25 NOTE — Progress Notes (Signed)
Lactulose enema given approximately over an hour, pt had BM prior to enema, during enema and after enema. Pt not able to hold the lactulose mixture long nor able to hold the entire amount.   Pt would leak around the retention enema (with bulb inflated) and eventually pushed the inflated bulb out of rectum.   Pt had lots of formed soft stool.   Pt more awake during the administration of the enema. Pt would moan out, and talk a little to staff.   Afterwards pt went back to sleep.

## 2019-09-25 NOTE — Progress Notes (Signed)
RT NOTES: ABG obtained and sent to lab. Lab tech notified.  

## 2019-09-26 DIAGNOSIS — G9340 Encephalopathy, unspecified: Secondary | ICD-10-CM

## 2019-09-26 LAB — BPAM RBC
Blood Product Expiration Date: 202107062359
Blood Product Expiration Date: 202107082359
ISSUE DATE / TIME: 202106300233
ISSUE DATE / TIME: 202107011441
Unit Type and Rh: 600
Unit Type and Rh: 600

## 2019-09-26 LAB — CBC
HCT: 26.5 % — ABNORMAL LOW (ref 39.0–52.0)
Hemoglobin: 8.1 g/dL — ABNORMAL LOW (ref 13.0–17.0)
MCH: 28.3 pg (ref 26.0–34.0)
MCHC: 30.6 g/dL (ref 30.0–36.0)
MCV: 92.7 fL (ref 80.0–100.0)
Platelets: 77 10*3/uL — ABNORMAL LOW (ref 150–400)
RBC: 2.86 MIL/uL — ABNORMAL LOW (ref 4.22–5.81)
RDW: 24.1 % — ABNORMAL HIGH (ref 11.5–15.5)
WBC: 4.2 10*3/uL (ref 4.0–10.5)
nRBC: 0 % (ref 0.0–0.2)

## 2019-09-26 LAB — TYPE AND SCREEN
ABO/RH(D): A NEG
Antibody Screen: NEGATIVE
Unit division: 0
Unit division: 0

## 2019-09-26 LAB — GLUCOSE, CAPILLARY
Glucose-Capillary: 107 mg/dL — ABNORMAL HIGH (ref 70–99)
Glucose-Capillary: 108 mg/dL — ABNORMAL HIGH (ref 70–99)
Glucose-Capillary: 108 mg/dL — ABNORMAL HIGH (ref 70–99)
Glucose-Capillary: 117 mg/dL — ABNORMAL HIGH (ref 70–99)
Glucose-Capillary: 122 mg/dL — ABNORMAL HIGH (ref 70–99)
Glucose-Capillary: 130 mg/dL — ABNORMAL HIGH (ref 70–99)

## 2019-09-26 LAB — BASIC METABOLIC PANEL
Anion gap: 10 (ref 5–15)
BUN: 28 mg/dL — ABNORMAL HIGH (ref 8–23)
CO2: 15 mmol/L — ABNORMAL LOW (ref 22–32)
Calcium: 10.1 mg/dL (ref 8.9–10.3)
Chloride: 116 mmol/L — ABNORMAL HIGH (ref 98–111)
Creatinine, Ser: 1.62 mg/dL — ABNORMAL HIGH (ref 0.61–1.24)
GFR calc Af Amer: 46 mL/min — ABNORMAL LOW (ref >60)
GFR calc non Af Amer: 39 mL/min — ABNORMAL LOW (ref >60)
Glucose, Bld: 108 mg/dL — ABNORMAL HIGH (ref 70–99)
Potassium: 4.1 mmol/L (ref 3.5–5.1)
Sodium: 141 mmol/L (ref 135–145)

## 2019-09-26 LAB — PROCALCITONIN: Procalcitonin: 1.31 ng/mL

## 2019-09-26 LAB — AMMONIA: Ammonia: 68 umol/L — ABNORMAL HIGH (ref 9–35)

## 2019-09-26 MED ORDER — SODIUM BICARBONATE 8.4 % IV SOLN
INTRAVENOUS | Status: DC
  Filled 2019-09-26 (×2): qty 100

## 2019-09-26 MED ORDER — ALBUTEROL SULFATE (2.5 MG/3ML) 0.083% IN NEBU: 2.5000 mg | INHALATION_SOLUTION | Freq: Four times a day (QID) | RESPIRATORY_TRACT | Status: DC | PRN

## 2019-09-26 MED ORDER — PIPERACILLIN-TAZOBACTAM 3.375 G IVPB
3.3750 g | Freq: Three times a day (TID) | INTRAVENOUS | Status: AC
  Administered 2019-09-26 – 2019-10-02 (×19): 3.375 g via INTRAVENOUS
  Filled 2019-09-26 (×19): qty 50

## 2019-09-26 NOTE — Progress Notes (Addendum)
PROGRESS NOTE    [name]  AST:[id number] DOB: Jun 19, 1940 DOA: 09/23/2019 PCP: Nicolette Bang, DO   Brief Narrative: 79 year old with past medical history significant for paroxysmal A. fib on low-dose aspirin but not on anticoagulation due to chronic anemia history of GI bleed, CAD, COPD, CVA, diabetes type 2, chronic diastolic heart failure, GERD history of gastric ulcer, urinary retention with chronic indwelling Foley catheter who presents from rehab for evaluation of generalized weakness and anemia.  Labs drawn at the facility show worsening anemia hemoglobin of 6.2.  Patient was very somnolent and difficult to arouse on admission.  He was able to say his name, say a few words.  Patient admitted with acute metabolic and cephalopathy, anemia, metabolic acidosis, increased ammonia level. He continued to be more lethargic today CCM consulted.    Assessment & Plan:   Principal Problem:   Symptomatic anemia Active Problems:   Diabetes mellitus, type II (HCC)   Diastolic CHF, chronic (HCC)   GI bleed   Hypokalemia   1-Symptomatic acute blood loss anemia presumed to occult GI bleed: 70 hemoglobin of 6.  At baseline 8-9 Occult blood positive. Received 1 unit of packed red blood cell 6-30. And second unit 7/01 CT with presumed fecal impaction.  Tapwater enema ordered GI has been consulted. Hb stable at 8.  Evaluated by GI, not a candidate for endoscopy.  Continue with IV Protonix  2-Acute metabolic encephalopathy: Patient presented with altered mental status.  Keep eyes closed, say a few words. ABG pH with respiratory alkalosis.  Metabolic acidosis on the vent Support care Protecting airway Ammonia level elevated at 88 today.  Continue with lactulose enema BID.  EEG negative for seizure. Unable to do MRI due to pacemaker.  Appreciate neurology evaluation.  Less lethargic today. Speech evaluation.   3-Sepsis secondary to  CAUTI; patient has chronic Foley  catheter due to urinary retention  Patient febrile hypotensive. Continue with fluids.  IV antibiotics. Plan to change cefepime to Zosyn to cover: Citrobacter and enterococcus Faecalis.  With more than 50 white blood cell.   4-Transaminases: Right upper quadrant ultrasound; Status post cholecystectomy. No biliary dilatation. Unremarkable sonographic appearance of the liver. Might be related to sepsis.   Hyperammonemia; might be related to bacteria overgrowth. Liver US normal appearance of liver.  Continue with lactulose.   Paroxysmal A. fib holding aspirin due to concern for GI bleed Acute on CKD stage III b; Prior Cr 1.4--1.5  Presents with cr at 1.8. Improved with IV fluids.   Diabetes: Continue with sliding scale insulin Chronic diastolic heart failure: Chest x-ray with progressive central vascular congestion.  Monitor for pulmonary edema on IVF. B/L ronchus; start nebulizer Thrombocytopenia; might be related to acute illness Estimated body mass index is 25.01 kg/m as calculated from the following:   Height as of 09/22/19: 5\' 6"  (1.676 m).   Weight as of this encounter: 70.3 kg.   DVT prophylaxis: SCDs no anticoagulation due to GI bleed Code Status: Full code Family Communication: Care discussed with daughter who was at bedside.  Disposition Plan:  Status is: Inpatient  Remains inpatient appropriate because:Hemodynamically unstable   Dispo: The patient is from: SNF              Anticipated d/c is to: SNF              Anticipated d/c date is: 3 days              Patient currently is not medically stable  to d/c.  Still lethargic encephalopathic requiring blood transfusion IV antibiotics        Consultants:   CCM  Neurology  Procedures:   EEG  Antimicrobials:  Cefepime  Subjective: Patient is less lethargic, open eyes to voice. Say few words.   Objective: Vitals:   09/26/19 0600 09/26/19 0700 09/26/19 0751 09/26/19 0941  BP: 106/63 113/62 121/64  121/65  Pulse: 96 92 71 97  Resp: 18 19 20 20   Temp:   99 F (37.2 C) 98.8 F (37.1 C)  TempSrc:   Oral Axillary  SpO2:   95% 92%  Weight:        Intake/Output Summary (Last 24 hours) at 09/26/2019 1104 Last data filed at 09/26/2019 0700 Gross per 24 hour  Intake 3111.88 ml  Output 1700 ml  Net 1411.88 ml   Filed Weights   09/24/19 2042  Weight: 70.3 kg    Examination:  General exam: Lethargic  Respiratory system: B/L ronchus.  Cardiovascular system: S 1, S 2 RRR Gastrointestinal system: BS present, soft, nt Central nervous system: Lethargic, open eyes to voice, say few words.  Extremities: trace edema hands   Data Reviewed: I have personally reviewed following labs and imaging studies  CBC: Recent Labs  Lab 09/23/19 2054 09/23/19 2054 09/24/19 0559 09/24/19 1020 09/25/19 0927 09/25/19 2038 09/26/19 1004  WBC 4.8  --  4.9  --  7.6  --  4.2  NEUTROABS 2.6  --   --   --   --   --   --   HGB 6.2*   < > 7.4* 7.8* 6.6* 8.0* 8.1*  HCT 20.5*   < > 24.5* 23.0* 22.4* 26.3* 26.5*  MCV 92.3  --  91.4  --  93.3  --  92.7  PLT 111*  --  123*  --  84*  --  77*   < > = values in this interval not displayed.   Basic Metabolic Panel: Recent Labs  Lab 09/23/19 2054 09/24/19 0559 09/24/19 1020 09/25/19 0927 09/26/19 0807  NA 136 136 141 139 141  K 3.2* 3.3* 2.9* 3.2* 4.1  CL 104 106  --  113* 116*  CO2 20* 16*  --  18* 15*  GLUCOSE 106* 104*  --  117* 108*  BUN 15 16  --  24* 28*  CREATININE 1.41* 1.71*  --  1.84* 1.62*  CALCIUM 9.5 9.8  --  9.5 10.1  MG  --  1.8  --   --   --    GFR: Estimated Creatinine Clearance: 32.8 mL/min (A) (by C-G formula based on SCr of 1.62 mg/dL (H)). Liver Function Tests: Recent Labs  Lab 09/23/19 2054 09/24/19 0559 09/25/19 0927  AST 82* 98* 87*  ALT 50* 48* 49*  ALKPHOS 146* 161* 119  BILITOT 0.7 1.4* 1.2  PROT 9.1* 8.8* 8.3*  ALBUMIN 1.8* 1.8* 1.6*   No results for input(s): LIPASE, AMYLASE in the last 168 hours. Recent  Labs  Lab 09/24/19 1823 09/25/19 0927 09/26/19 0807  AMMONIA 66* 86* 68*   Coagulation Profile: No results for input(s): INR, PROTIME in the last 168 hours. Cardiac Enzymes: No results for input(s): CKTOTAL, CKMB, CKMBINDEX, TROPONINI in the last 168 hours. BNP (last 3 results) No results for input(s): PROBNP in the last 8760 hours. HbA1C: No results for input(s): HGBA1C in the last 72 hours. CBG: Recent Labs  Lab 09/25/19 1613 09/25/19 2012 09/26/19 0037 09/26/19 0346 09/26/19 0746  GLUCAP 127* 120* 122* 107*  108*   Lipid Profile: No results for input(s): CHOL, HDL, LDLCALC, TRIG, CHOLHDL, LDLDIRECT in the last 72 hours. Thyroid Function Tests: No results for input(s): TSH, T4TOTAL, FREET4, T3FREE, THYROIDAB in the last 72 hours. Anemia Panel: Recent Labs    09/24/19 0822  VITAMINB12 854  FOLATE 12.1  FERRITIN 47  TIBC 330  IRON 36*  RETICCTPCT 3.6*   Sepsis Labs: Recent Labs  Lab 09/25/19 0927 09/26/19 0253  PROCALCITON 1.63 1.31  LATICACIDVEN 1.8  --     Recent Results (from the past 240 hour(s))  Urine Culture     Status: Abnormal (Preliminary result)   Collection Time: 09/23/19 11:48 PM   Specimen: Urine, Random  Result Value Ref Range Status   Specimen Description URINE, RANDOM  Final   Special Requests   Final    NONE Performed at [facility], [place] [address] [place], [place] [address]    Culture >=100,000 COLONIES/mL GRAM NEGATIVE RODS (A)  Final   Report Status PENDING  Incomplete  SARS Coronavirus 2 by RT PCR (hospital order, performed in [facility] hospital lab) Nasopharyngeal Nasopharyngeal Swab     Status: None   Collection Time: 09/24/19 12:56 AM   Specimen: Nasopharyngeal Swab  Result Value Ref Range Status   SARS Coronavirus 2 NEGATIVE NEGATIVE Final    Comment: (NOTE) SARS-CoV-2 target nucleic acids are NOT DETECTED.  The SARS-CoV-2 RNA is generally detectable in upper and lower respiratory specimens during the acute phase  of infection. The lowest concentration of SARS-CoV-2 viral copies this assay can detect is 250 copies / mL. A negative result does not preclude SARS-CoV-2 infection and should not be used as the sole basis for treatment or other patient management decisions.  A negative result may occur with improper specimen collection / handling, submission of specimen other than nasopharyngeal swab, presence of viral mutation(s) within the areas targeted by this assay, and inadequate number of viral copies (<250 copies / mL). A negative result must be combined with clinical observations, patient history, and epidemiological information.  Fact Sheet for Patients:   StrictlyIdeas.no  Fact Sheet for Healthcare Providers: BankingDealers.co.za  This test is not yet approved or  cleared by the [place] FDA and has been authorized for detection and/or diagnosis of SARS-CoV-2 by FDA under an Emergency Use Authorization (EUA).  This EUA will remain in effect (meaning this test can be used) for the duration of the COVID-19 declaration under Section 564(b)(1) of the Act, 21 U.S.C. section 360bbb-3(b)(1), unless the authorization is terminated or revoked sooner.  Performed at [facility], [place] [address], [place], [place] [address]   Culture, blood (routine x 2)     Status: None (Preliminary result)   Collection Time: 09/24/19  7:12 AM   Specimen: BLOOD LEFT FOREARM  Result Value Ref Range Status   Specimen Description BLOOD LEFT FOREARM  Final   Special Requests   Final    BOTTLES DRAWN AEROBIC ONLY Blood Culture adequate volume   Culture   Final    NO GROWTH 2 DAYS Performed at [facility], [place] [address], [place], [place] [address]    Report Status PENDING  Incomplete  Culture, blood (routine x 2)     Status: None (Preliminary result)   Collection Time: 09/24/19  8:22 AM   Specimen: BLOOD  Result Value Ref Range Status   Specimen  Description BLOOD SITE NOT SPECIFIED  Final   Special Requests   Final  BOTTLES DRAWN AEROBIC AND ANAEROBIC Blood Culture results may not be optimal due to an excessive volume of blood received in culture bottles   Culture   Final    NO GROWTH 2 DAYS Performed at [facility] [address] [place], [place] [address]    Report Status PENDING  Incomplete         Radiology Studies: No results found.      Scheduled Meds: . Chlorhexidine Gluconate Cloth  6 each Topical Daily  . insulin aspart  0-9 Units Subcutaneous Q4H  . lactulose  300 mL Rectal BID  . pantoprazole (PROTONIX) IV  40 mg Intravenous Q12H  . tamsulosin  0.4 mg Oral Daily   Continuous Infusions: . sodium chloride 75 mL/hr at 09/26/19 0817  . ceFEPime (MAXIPIME) IV 2 g (09/26/19 1003)  . diltiazem (CARDIZEM) infusion Stopped (09/24/19 1848)     LOS: 2 days    Time spent: 35 minutes.     Elmarie Shiley, MD [facility]   If 7PM-7AM, please contact night-coverage [email or web address]  09/26/2019, 11:04 AM

## 2019-09-26 NOTE — Progress Notes (Signed)
NAME:  [name], MRN:  [id number], DOB:  Oct 18, 1940, LOS: 2 ADMISSION DATE:  09/23/2019, CONSULTATION DATE: 09/25/2019 REFERRING MD: [facility], CHIEF COMPLAINT: Altered mental status  Brief History   79 year old with ongoing decline over several months now with anemia, altered mental status, atrial fibrillation ventricular response.  History of present illness   79 year old male has recently been falling down and has been seen multiple times in the emergency room.  Subsequently discharged to [facility] skilled nursing facility where he was noted to have altered mental status and he was transferred to the emergency department [facility].  He was noted to have anemia with a hemoglobin of 6.0 requiring transfusion of 1 packed cells elevating it to 7.0.  CT of the head did not reveal any acute abnormalities.  At the time of my examination he does have a downward gaze preference.  Currently is hemodynamically stable with adequate oxygenation and ventilation but the concern is his downward gaze preference neuro has been consulted for this.  We will leave him in the stepdown unit for now his daughter was updated 09/25/2019.  Should he deteriorate we will transfer to the intensive care unit as he is a full code.   Past Medical History   Past Medical History:  Diagnosis Date  . Allergic rhinitis   . Anemia    takes Ferrous Sulfate daily  . Arthritis    "all over"  . Atrial flutter ([place])   . Balance problem 01/2014  . CAP (community acquired pneumonia) 09/18/2014  . Cervical radiculopathy due to degenerative joint disease of spine   . Chronic coronary artery disease    a. 03/2010 Nonocclusive disease by cath, performed for ST elevations on ECG;  b. 06/2013 Lexi MV: EF 60%, no ischemia.  [name] COPD (chronic obstructive pulmonary disease) ([place])   . Diabetes mellitus type II    takes Metformin and Lantus daily  . Diastolic CHF, chronic ([place])    a. 12/2012 EF 55-60%, diast dysfxn, triv MR, mildly  dil LA/RA.  [name] Dyspnea    with pain  . Essential hypertension    takes Diltiazem daily  . Frail elderly   . GERD (gastroesophageal reflux disease)   . History of blood transfusion 1982   "when I had stomach OR"  . History of bronchitis    1998  . History of CVA (cerebrovascular accident)   . History of gastric ulcer   . History of kidney stones   . Hypercholesterolemia    takes Pravastatin daily  . Intrinsic eczema   . Legally blind in left eye, as defined in [place]   . Obesity   . PAF (paroxysmal atrial fibrillation) (HCC)    Recurrent after atrial flutter (a. 07/2010 Status post caval tricuspid isthmus ablation by Dr. Midge Aver Metoprolol daily), currently controlled on flecainide plus diltiazem and Coumadin  . Urinary retention   . Vitamin D deficiency   . Weakness    numbness and tingling both hands     Significant Hospital Events   Admitted 09/24/2019 with worsening mental status anemia transfer to stepdown unit  Consults:  09/25/2019 PCCM 09/25/2019 neuro  Procedures:    Significant Diagnostic Tests:    Micro Data:  09/24/2019 urine culture>> 09/24/2019 blood culture>>  Antimicrobials:    09/24/2019 Maxipime>> Interim history/subjective:  79 year old male with anemia, altered mental status negative CT of the head but with downward gaze preference  Objective   Blood pressure 121/65, pulse 97, temperature 98.8 F (37.1 C), temperature source  Axillary, resp. rate 20, weight 70.3 kg, SpO2 92 %.        Intake/Output Summary (Last 24 hours) at 09/26/2019 1052 Last data filed at 09/26/2019 0700 Gross per 24 hour  Intake 3111.88 ml  Output 1700 ml  Net 1411.88 ml   Filed Weights   09/24/19 2042  Weight: 70.3 kg    Examination: General more awake but still lethargic HEENT no JVD or lymphadenopathy is appreciated at this time Heart sounds are distant Decreased breath sounds in the bases Abdomen soft nontender  extremities some mild edema   Resolved Hospital  Problem list     Assessment & Plan:  Altered mental status most likely multifactorial with elevated ammonia level, chronic anemia with a negative CT of the head but showing downward gaze preference. Follow ammonia levels MRI is not possible due to pacemaker Appreciate neurology's involvement Avoid sedation No need to go to intensive care unit Pulmonary critical care will be available as needed   Acute on chronic anemia with a history of GI bleeds Recent Labs    09/25/19 0927 09/25/19 2038  HGB 6.6* 8.0*    Hemoglobin is improved GI services is following Serial CBCs  History of chronic indwelling Foley with findings consistent with UTI on this admission.  Findings consistent with UTI Peers are improving improving on antimicrobial therapy Continue antibiotics   History of paroxysmal atrial fibrillation with episode of rapid ventricular response.  Congestive heart failure Hemodynamically stable  Diabetes CBG (last 3)  Recent Labs    09/26/19 0037 09/26/19 0346 09/26/19 0746  GLUCAP 122* 107* 108*  Continue sliding scale insulin protocol   History of COPD and prolonged tobacco use More awake On room air No indication for intubation   Best practice:  Diet: NPO Pain/Anxiety/Delirium protocol (if indicated): No sedation now due to altered mental status VAP protocol (if indicated): Not indicated DVT prophylaxis: Hold on anticoagulation due to history of GI bleed anemia GI prophylaxis: PPI Glucose control: Sliding scale insulin protocol Mobility bedrest Code Status: Full code Family Communication: Daughter updated he remains full code for now Disposition: Continue in stepdown for now  Labs   CBC: Recent Labs  Lab 09/23/19 2054 09/24/19 0559 09/24/19 1020 09/25/19 0927 09/25/19 2038  WBC 4.8 4.9  --  7.6  --   NEUTROABS 2.6  --   --   --   --   HGB 6.2* 7.4* 7.8* 6.6* 8.0*  HCT 20.5* 24.5* 23.0* 22.4* 26.3*  MCV 92.3 91.4  --  93.3  --   PLT 111* 123*   --  84*  --     Basic Metabolic Panel: Recent Labs  Lab 09/23/19 2054 09/24/19 0559 09/24/19 1020 09/25/19 0927 09/26/19 0807  NA 136 136 141 139 141  K 3.2* 3.3* 2.9* 3.2* 4.1  CL 104 106  --  113* 116*  CO2 20* 16*  --  18* 15*  GLUCOSE 106* 104*  --  117* 108*  BUN 15 16  --  24* 28*  CREATININE 1.41* 1.71*  --  1.84* 1.62*  CALCIUM 9.5 9.8  --  9.5 10.1  MG  --  1.8  --   --   --    GFR: Estimated Creatinine Clearance: 32.8 mL/min (A) (by C-G formula based on SCr of 1.62 mg/dL (H)). Recent Labs  Lab 09/23/19 2054 09/24/19 0559 09/25/19 0927 09/26/19 0253  PROCALCITON  --   --  1.63 1.31  WBC 4.8 4.9 7.6  --  LATICACIDVEN  --   --  1.8  --     Liver Function Tests: Recent Labs  Lab 09/23/19 2054 09/24/19 0559 09/25/19 0927  AST 82* 98* 87*  ALT 50* 48* 49*  ALKPHOS 146* 161* 119  BILITOT 0.7 1.4* 1.2  PROT 9.1* 8.8* 8.3*  ALBUMIN 1.8* 1.8* 1.6*   No results for input(s): LIPASE, AMYLASE in the last 168 hours. Recent Labs  Lab 09/24/19 1823 09/25/19 0927 09/26/19 0807  AMMONIA 66* 86* 68*    ABG    Component Value Date/Time   PHART 7.473 (H) 09/25/2019 0904   PCO2ART 24.5 (L) 09/25/2019 0904   PO2ART 86.4 09/25/2019 0904   HCO3 17.8 (L) 09/25/2019 0904   TCO2 19 (L) 09/24/2019 1020   ACIDBASEDEF 5.2 (H) 09/25/2019 0904   O2SAT 97.2 09/25/2019 0904     Coagulation Profile: No results for input(s): INR, PROTIME in the last 168 hours.  Cardiac Enzymes: No results for input(s): CKTOTAL, CKMB, CKMBINDEX, TROPONINI in the last 168 hours.  HbA1C: Hemoglobin A1C  Date/Time Value Ref Range Status  04/22/2019 09:29 AM 6.4 (A) 4.0 - 5.6 % Final   Hgb A1c MFr Bld  Date/Time Value Ref Range Status  07/29/2019 05:25 AM 8.0 (H) 4.8 - 5.6 % Final    Comment:    (NOTE) Pre diabetes:          5.7%-6.4% Diabetes:              >6.4% Glycemic control for   <7.0% adults with diabetes   10/02/2018 09:33 AM 7.2 (H) 4.8 - 5.6 % Final    Comment:              Prediabetes: 5.7 - 6.4          Diabetes: >6.4          Glycemic control for adults with diabetes: <7.0     CBG: Recent Labs  Lab 09/25/19 1613 09/25/19 2012 09/26/19 0037 09/26/19 0346 09/26/19 0746  GLUCAP 127* 120* 122* 107* 108*      Critical care time:      Richardson Landry Tameya Kuznia ACNP Acute Care Nurse Practitioner [facility] Pulmonary/Critical Care Please consult Amion 09/26/2019, 10:52 AM

## 2019-09-26 NOTE — Progress Notes (Signed)
Cannot have MRI due to his pacemaker being [employer] and the leads being [employer]. Generator and leads must be of the same vendor to safely scan.   Electronically signed: Dr. Kerney Elbe

## 2019-09-26 NOTE — Consult Note (Signed)
Palliative Medicine Inpatient Consult Note  Reason for consult:  Goals Of Care  HPI:  Per intake H&P --> [name] is a 79 y.o. male with medical history significant of paroxysmal A. fib on low-dose aspirin but not anticoagulation due to chronic anemia and history of GI bleed, CAD, COPD, CVA, non-insulin-dependent type 2 diabetes, chronic diastolic CHF, hypertension, hyperlipidemia, GERD, history of gastric ulcer, history of urinary retention with a chronic indwelling Foley catheter presenting to the ED from his rehab facility for evaluation of generalized weakness and anemia.  Labs drawn at the facility revealed worsening anemia with hemoglobin 6.2.   Palliative care was asked to aid in goals of care conversations.  Clinical Assessment/Goals of Care: I have reviewed medical records including EPIC notes, labs and imaging, received report from bedside RN, assessed the patient.    I called [name] to further discuss diagnosis prognosis, GOC, EOL wishes, disposition and options.   I introduced Palliative Medicine as specialized medical care for people living with serious illness. It focuses on providing relief from the symptoms and stress of a serious illness. The goal is to improve quality of life for both the patient and the family.  I asked [name] to tell me about her father. She states that he is from [place] originally. He moved here to be cared for by her about ten years ago. He is a widower, his wife passed away in July 02, 1997. He had six children one of whom are deceased. 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 He enjoyed watching sports on television. He is not a religious man.  In regards to patients functional state. Per [name] he was fairly functional until about May when he was having recurrent falls and increased difficulty maintaining proper hygiene. [name] stated that she and her husband work second shift therefore [name] was often left at home alone for  a period of time during the day. [name] realized that this would no longer be possible given his falls therefore he was placed from his prior hospitalization at [facility] for rehab and then  custodial care.   [name] goes on to say that over the last two years, [name] has suffered a decline. He was noted once he got a long term catheter placed to no longer wish to go out of the house. He lost interested in things that he had enjoyed prior like sports.   A detailed discussion was had today regarding advanced directives, there are none on file and per [name] no documents had ever been formally completed to her knowledge.    A MOST form was introduced. Concepts specific to code status, artifical feeding and hydration, continued IV antibiotics and rehospitalization was had. I shared with [name] that if we placed [name] through CPR we would likely cause him tremendous trauma and cause greater burden for him than benefit. She vocalized understanding though stated that she would like to discuss this with her brother and sister who plan on coming into the hospital tomorrow.   [name] is leaning towards DNR as she feels that her father has lived a full life. She states that if her were "hooked up to machines" it would not be the life he would have desired. I shared with her that if she feels this way it is of the utmost importance that this is placed into writing.   The difference between a aggressive medical intervention path  and a palliative comfort care path for this patient at  this time was had.    Discussed the importance of continued conversation with family and their  medical providers regarding overall plan of care and treatment options, ensuring decisions are within the context of the patients values and GOCs.  Provided "Hard Choices for [name]" booklet.   Decision Maker: [name] (Daughter) [phone]  SUMMARY OF RECOMMENDATIONS   Full Code --> strongly recommended DNR to  patient daughter  Family meeting tomorrow afternoon to further discuss goals inclusive of resuscitation and nutrition  Ongoing PMT support  Code Status/Advance Care Planning: FULL CODE   Palliative Prophylaxis:   Oral care, Constipation   Additional Recommendations (Limitations, Scope, Preferences):  Continue current scope of care   Psycho-social/Spiritual:   Desire for further Chaplaincy support: No  Additional Recommendations: Education on palliative care   Prognosis: Poor (< 6 months) in the setting of frailty (+) 3 admissions in the last six months (+) multiple chronic diseases  Discharge Planning: Discharge to [facility] with OP Palliative Care  Review of Systems  Unable to perform ROS: Mental status change   Vitals with BMI 09/26/2019 09/26/2019 09/26/2019  Height - - -  Weight - - -  BMI - - -  Systolic 502 774 128  Diastolic 76 65 64  Pulse 99 97 71   Physical Exam Vitals reviewed.  Constitutional:      Appearance: He is ill-appearing.  HENT:     Head: Normocephalic.     Nose: Nose normal.     Mouth/Throat:     Mouth: Mucous membranes are dry.  Eyes:     Pupils: Pupils are equal, round, and reactive to light.  Cardiovascular:     Rate and Rhythm: Tachycardia present.     Pulses: Normal pulses.  Pulmonary:     Breath sounds: Rhonchi present.  Abdominal:     General: Abdomen is flat.  Musculoskeletal:     Cervical back: Normal range of motion.  Skin:    General: Skin is warm.     Capillary Refill: Capillary refill takes less than 2 seconds.  Neurological:     Comments: Mumbles a few words    PPS: 20%   This conversation/these recommendations were discussed with patient primary care team, Dr. Tyrell Antonio  Time In: 1115 Time Out: 1230 Total Time: 75 Greater than 50%  of this time was spent counseling and coordinating care related to the above assessment and plan.  [place] Team Team Cell Phone:  [phone] Please utilize secure chat with additional questions, if there is no response within 30 minutes please call the above phone number  Palliative Medicine Team providers are available by phone from 7am to 7pm daily and can be reached through the team cell phone.  Should this patient require assistance outside of these hours, please call the patient's attending physician.

## 2019-09-26 NOTE — Procedures (Signed)
[facility] Consult Services  Routine Inpatient Electroencephalogram (EEG)  Indication: Encephalopathy  Date of Study: 09/25/2019  Brief History: 79 year old white male presenting with altered mental status and recurrent falls.  He was found to have a downward gaze.  Description:   This is a routine inpatient EEG using the International Standard 10-20 system of electrode placement. Photic stimulation and hyperventilation were deferred.  The background showed theta slowing at 4-6 hertz, with also diffuse theta slowing as well. There were no definitive epileptiform discharges or electrographic seizures seen.  Impression:   This is an abnormal EEG due to the presence of moderate diffuse slowing.  Findings are suggestive of an underlying encephalopathy possibly related to toxic, metabolic conditions, versus diffuse structural brain abnormalities.  The absence of epileptiform discharges does not necessarily rule out an underlying seizure disorder.  Clinical correlation is advised.

## 2019-09-26 NOTE — Social Work (Signed)
Unable to complete assessment- left voice message with patient's daughter,[name].  Thurmond Butts, MSW, [place] Clinical Social Worker

## 2019-09-26 NOTE — Progress Notes (Signed)
Plan of care reviewed. Pt's hemodynamically stable. Atrial fib on monitor, HR 80s-90s, BP within normal limtits, SPO2 97-98% on 2 LPM of NCL, Temp 99.4- 99.6 F, RR 20-21.   He's lethargic, drowsy, and responded to voice, withdrew from painful stimulated, incomprehensible speech, mostly moaning when staff turned his positions or giving nursing care.   We're unable to complete Lactulose enema due to Pt continously pushed it out. Notified Dr. Roger Kill, on-call provider, recommended to use rectal tube [facility]) because Pt had continuously watery with mushy stool multiple times today. No bloody stool or bleeding through rectum noted after rectal tube applied. We will continue to monitor.  Kennyth Lose, RN

## 2019-09-26 NOTE — Progress Notes (Signed)
Pt unable to have MRI as ordered. Pt has Doctor, hospital and [employer] lead. To maintain MRI conditional labeling, must be same vendor for both lead and generator that are both MR conditionally labeled. Notified RN and sent secure chat message to Dr Tyrell Antonio and Dr Cheral Marker informing them of this. Awaiting response.

## 2019-09-26 NOTE — Progress Notes (Signed)
Lactulose enema attempted.  Flexi seal dislodged and reinserted.  With flexi seal properly inserted Pt could only retain about 110mL of medication out of 1,073mL.

## 2019-09-26 NOTE — Evaluation (Signed)
Clinical/Bedside Swallow Evaluation Patient Details  Name: Edwin Baines MRN: [id number] Date of Birth: Nov 06, 1940  Today's Date: 09/26/2019 Time: SLP Start Time (ACUTE ONLY): 1520 SLP Stop Time (ACUTE ONLY): 1528 SLP Time Calculation (min) (ACUTE ONLY): 8 min  Past Medical History:  Past Medical History:  Diagnosis Date  . Allergic rhinitis   . Anemia    takes Ferrous Sulfate daily  . Arthritis    "all over"  . Atrial flutter ([place])   . Balance problem 01/2014  . CAP (community acquired pneumonia) 09/18/2014  . Cervical radiculopathy due to degenerative joint disease of spine   . Chronic coronary artery disease    a. 03/2010 Nonocclusive disease by cath, performed for ST elevations on ECG;  b. 06/2013 Lexi MV: EF 60%, no ischemia.  [name] COPD (chronic obstructive pulmonary disease) ([place])   . Diabetes mellitus type II    takes Metformin and Lantus daily  . Diastolic CHF, chronic ([place])    a. 12/2012 EF 55-60%, diast dysfxn, triv MR, mildly dil LA/RA.  [name] Dyspnea    with pain  . Essential hypertension    takes Diltiazem daily  . Frail elderly   . GERD (gastroesophageal reflux disease)   . History of blood transfusion 1982   "when I had stomach OR"  . History of bronchitis    1998  . History of CVA (cerebrovascular accident)   . History of gastric ulcer   . History of kidney stones   . Hypercholesterolemia    takes Pravastatin daily  . Intrinsic eczema   . Legally blind in left eye, as defined in [place]   . Obesity   . PAF (paroxysmal atrial fibrillation) ([facility])    Recurrent after atrial flutter (a. 07/2010 Status post caval tricuspid isthmus ablation by Dr. Midge Aver Metoprolol daily), currently controlled on flecainide plus diltiazem and Coumadin  . Urinary retention   . Vitamin D deficiency   . Weakness    numbness and tingling both hands   Past Surgical History:  Past Surgical History:  Procedure Laterality Date  . ATRIAL ABLATION SURGERY  08/05/10   CTI ablation for  atrial flutter by [facility]  . CARDIAC CATHETERIZATION  2012   nl LV function, no occlusive CAD, PAF  . CARDIOVERSION  12/07/2010    Successful direct current cardioversion with atrial fibrillation to normal sinus rhythm  . CARPAL TUNNEL RELEASE Bilateral 01/30/2014   Procedure: BILATERAL CARPAL TUNNEL RELEASE;  Surgeon: Marianna Payment, MD;  Location: [place];  Service: Orthopedics;  Laterality: Bilateral;  . CATARACT EXTRACTION W/ INTRAOCULAR LENS  IMPLANT, BILATERAL Bilateral   . COLONOSCOPY [name] 12/02/2013   Procedure: COLONOSCOPY;  Surgeon: Irene Shipper, MD;  Location: [place];  Service: Endoscopy;  Laterality: [name];  . ESOPHAGOGASTRODUODENOSCOPY [name] 09/22/2014   Procedure: ESOPHAGOGASTRODUODENOSCOPY (EGD);  Surgeon: Ronald Lobo, MD;  [facility] ENDOSCOPY;  Service: Endoscopy;  Laterality: [name];  . ESOPHAGOGASTRODUODENOSCOPY [name] 07/11/2016   Procedure: ESOPHAGOGASTRODUODENOSCOPY (EGD);  Surgeon: Doran Stabler, MD;  Location: [facility] ENDOSCOPY;  Service: Endoscopy;  Laterality: [name];  . EXTRACORPOREAL SHOCK WAVE LITHOTRIPSY Right 07/15/2018   Procedure: EXTRACORPOREAL SHOCK WAVE LITHOTRIPSY (ESWL);  Surgeon: Lucas Mallow, MD;  Location: [facility];  Service: Urology;  Laterality: Right;  . INCISION AND DRAINAGE ABSCESS / HEMATOMA OF BURSA / KNEE / THIGH Left 1998   knee  . KNEE BURSECTOMY Left 1998  . LAPAROSCOPIC CHOLECYSTECTOMY  03/2010  . LEFT HEART CATH AND CORONARY ANGIOGRAPHY [name] 10/05/2017   Procedure: LEFT  HEART CATH AND CORONARY ANGIOGRAPHY;  Surgeon: Martinique, Peter M, MD;  Location: [place] CV LAB;  Service: Cardiovascular;  Laterality: [name];  . NM MYOVIEW LTD  07/22/2013   Normal EF ~60%, no ischemia or infarction.  [name] PACEMAKER IMPLANT [name] 10/21/2018   Procedure: PACEMAKER IMPLANT;  Surgeon: Deboraha Sprang, MD;  Location: [place] CV LAB;  Service: Cardiovascular;  Laterality: [name];  . PARTIAL GASTRECTOMY  1982   subtotal; "took out 30% for ulcers"  . TRANSTHORACIC  ECHOCARDIOGRAM  02/16/2014   EF 60%, no RWMA. - otherwise normal  . YAG LASER APPLICATION Bilateral    HPI:  Pt is an 79 y.o. male with medical history significant of paroxysmal A. fib on low-dose aspirin but not anticoagulation due to chronic anemia and history of GI bleed, CAD, COPD, CVA, non-insulin-dependent type 2 diabetes, chronic diastolic CHF, hypertension, hyperlipidemia, GERD, history of gastric ulcer, history of urinary retention with a chronic indwelling Foley catheter who presented to the ED from his rehab facility for evaluation of generalized weakness and anemia. CT head 6/30 negative for acute changes. CXR 6/30: Progressive central vascular congestion without overt edema. EEG: moderate diffuse slowing.  Findings are suggestive of an underlying encephalopathy possibly related to toxic, metabolic conditions, versus diffuse structural brain abnormalities.   Assessment / Plan / Recommendation Clinical Impression  Pt was seen for bedside swallow evaluation which was limited due to the pt's reduced level of cooperation/participation. He did not open his eyes throughout the assessment but verbalized and responded to some of the SLP's questions. Pt did not open his mouth adequately for a thorough oral assessment and he was unable to complete an oral mechanism exam. However, he presented with a dry oral mucosa and intermittently spat out dried secretions. Pt required a considerable amount of encouragement to accept p.o. trials and he frequently pursed his lips when trials were presented. Pt spat out all trials and stated, "No" or "I don't want it" following presentation. He does not present as a candidate for safe oral intake at this time and an NPO status is therefore recommended. Pt's family is currently in communication with the Palliative Care team for establishment of goals of care. Short-term non-oral nutrition may be warranted if his behavior persists and this aligns with the family's goals of  care. SLP will follow to assess improvement in swallow function and his ability to safely tolerate a p.o. diet.  SLP Visit Diagnosis: Dysphagia, oral phase (R13.11)    Aspiration Risk       Diet Recommendation NPO   Medication Administration: Via alternative means Postural Changes: Seated upright at 90 degrees    Other  Recommendations Oral Care Recommendations: Oral care QID   Follow up Recommendations Other (comment) (TBD)      Frequency and Duration min 2x/week  2 weeks       Prognosis Prognosis for Safe Diet Advancement: Fair Barriers to Reach Goals: Cognitive deficits      Swallow Study   General Date of Onset: 09/25/19 HPI: Pt is an 79 y.o. male with medical history significant of paroxysmal A. fib on low-dose aspirin but not anticoagulation due to chronic anemia and history of GI bleed, CAD, COPD, CVA, non-insulin-dependent type 2 diabetes, chronic diastolic CHF, hypertension, hyperlipidemia, GERD, history of gastric ulcer, history of urinary retention with a chronic indwelling Foley catheter who presented to the ED from his rehab facility for evaluation of generalized weakness and anemia. CT head 6/30 negative for acute changes. CXR 6/30: Progressive  central vascular congestion without overt edema. EEG: moderate diffuse slowing.  Findings are suggestive of an underlying encephalopathy possibly related to toxic, metabolic conditions, versus diffuse structural brain abnormalities. Type of Study: Bedside Swallow Evaluation Previous Swallow Assessment: None Diet Prior to this Study: NPO Temperature Spikes Noted: No Respiratory Status: Room air History of Recent Intubation: No Behavior/Cognition: Cooperative;Pleasant mood Oral Cavity Assessment: Dried secretions;Dry Oral Care Completed by SLP: No Oral Cavity - Dentition: Other (Comment) (UTA due to limited oral opening) Vision:  (UTA) Self-Feeding Abilities: Total assist Patient Positioning: Upright in bed;Postural control  adequate for testing Baseline Vocal Quality: Hoarse Volitional Cough: Cognitively unable to elicit Volitional Swallow: Unable to elicit    Oral/Motor/Sensory Function Overall Oral Motor/Sensory Function: Other (comment) (UTA)   Ice Chips Ice chips: Impaired Presentation: Spoon Oral Phase Impairments: Other (comment) (Pt spat out all boluses presented )   Thin Liquid Thin Liquid: Impaired Presentation: Cup Oral Phase Impairments: Other (comment) (Pt spat out all boluses as they were presented via cup. )    Nectar Thick Nectar Thick Liquid: Not tested   Honey Thick Honey Thick Liquid: Not tested   Puree Puree: Impaired Presentation: Spoon Oral Phase Impairments: Other (comment)   Solid    Santresa Levett I. Hardin Negus, [place], [place] Office number [phone] Pager [phone]  Solid: Not tested      Horton Marshall 09/26/2019,3:42 PM

## 2019-09-27 ENCOUNTER — Inpatient Hospital Stay (HOSPITAL_COMMUNITY): Payer: Medicare Other

## 2019-09-27 DIAGNOSIS — Z515 Encounter for palliative care: Secondary | ICD-10-CM

## 2019-09-27 DIAGNOSIS — Z66 Do not resuscitate: Secondary | ICD-10-CM

## 2019-09-27 DIAGNOSIS — Z7189 Other specified counseling: Secondary | ICD-10-CM

## 2019-09-27 LAB — GLUCOSE, CAPILLARY
Glucose-Capillary: 112 mg/dL — ABNORMAL HIGH (ref 70–99)
Glucose-Capillary: 112 mg/dL — ABNORMAL HIGH (ref 70–99)
Glucose-Capillary: 119 mg/dL — ABNORMAL HIGH (ref 70–99)
Glucose-Capillary: 122 mg/dL — ABNORMAL HIGH (ref 70–99)
Glucose-Capillary: 130 mg/dL — ABNORMAL HIGH (ref 70–99)
Glucose-Capillary: 133 mg/dL — ABNORMAL HIGH (ref 70–99)

## 2019-09-27 LAB — CBC
HCT: 25.8 % — ABNORMAL LOW (ref 39.0–52.0)
Hemoglobin: 7.9 g/dL — ABNORMAL LOW (ref 13.0–17.0)
MCH: 28.4 pg (ref 26.0–34.0)
MCHC: 30.6 g/dL (ref 30.0–36.0)
MCV: 92.8 fL (ref 80.0–100.0)
Platelets: 75 10*3/uL — ABNORMAL LOW (ref 150–400)
RBC: 2.78 MIL/uL — ABNORMAL LOW (ref 4.22–5.81)
RDW: 23.6 % — ABNORMAL HIGH (ref 11.5–15.5)
WBC: 3.4 10*3/uL — ABNORMAL LOW (ref 4.0–10.5)
nRBC: 0.9 % — ABNORMAL HIGH (ref 0.0–0.2)

## 2019-09-27 LAB — COMPREHENSIVE METABOLIC PANEL
ALT: 54 U/L — ABNORMAL HIGH (ref 0–44)
AST: 98 U/L — ABNORMAL HIGH (ref 15–41)
Albumin: 1.4 g/dL — ABNORMAL LOW (ref 3.5–5.0)
Alkaline Phosphatase: 144 U/L — ABNORMAL HIGH (ref 38–126)
Anion gap: 9 (ref 5–15)
BUN: 24 mg/dL — ABNORMAL HIGH (ref 8–23)
CO2: 20 mmol/L — ABNORMAL LOW (ref 22–32)
Calcium: 10.2 mg/dL (ref 8.9–10.3)
Chloride: 117 mmol/L — ABNORMAL HIGH (ref 98–111)
Creatinine, Ser: 1.61 mg/dL — ABNORMAL HIGH (ref 0.61–1.24)
GFR calc Af Amer: 46 mL/min — ABNORMAL LOW (ref >60)
GFR calc non Af Amer: 40 mL/min — ABNORMAL LOW (ref >60)
Glucose, Bld: 125 mg/dL — ABNORMAL HIGH (ref 70–99)
Potassium: 2.9 mmol/L — ABNORMAL LOW (ref 3.5–5.1)
Sodium: 146 mmol/L — ABNORMAL HIGH (ref 135–145)
Total Bilirubin: 1.4 mg/dL — ABNORMAL HIGH (ref 0.3–1.2)
Total Protein: 8.1 g/dL (ref 6.5–8.1)

## 2019-09-27 LAB — AMMONIA: Ammonia: 61 umol/L — ABNORMAL HIGH (ref 9–35)

## 2019-09-27 LAB — PROCALCITONIN: Procalcitonin: 0.92 ng/mL

## 2019-09-27 IMAGING — CR LEFT HAND - COMPLETE 3+ VIEW
3 series · 3 of 3 positions shown · non-contrast
Comparison: Left wrist series 07/21/2014.

CLINICAL DATA: 78-year-old male with pain and swelling in the left
hand. Fall 2 weeks ago. Bruising at that time which has resolved.

EXAM:
LEFT HAND - COMPLETE 3+ VIEW

[hand pa]
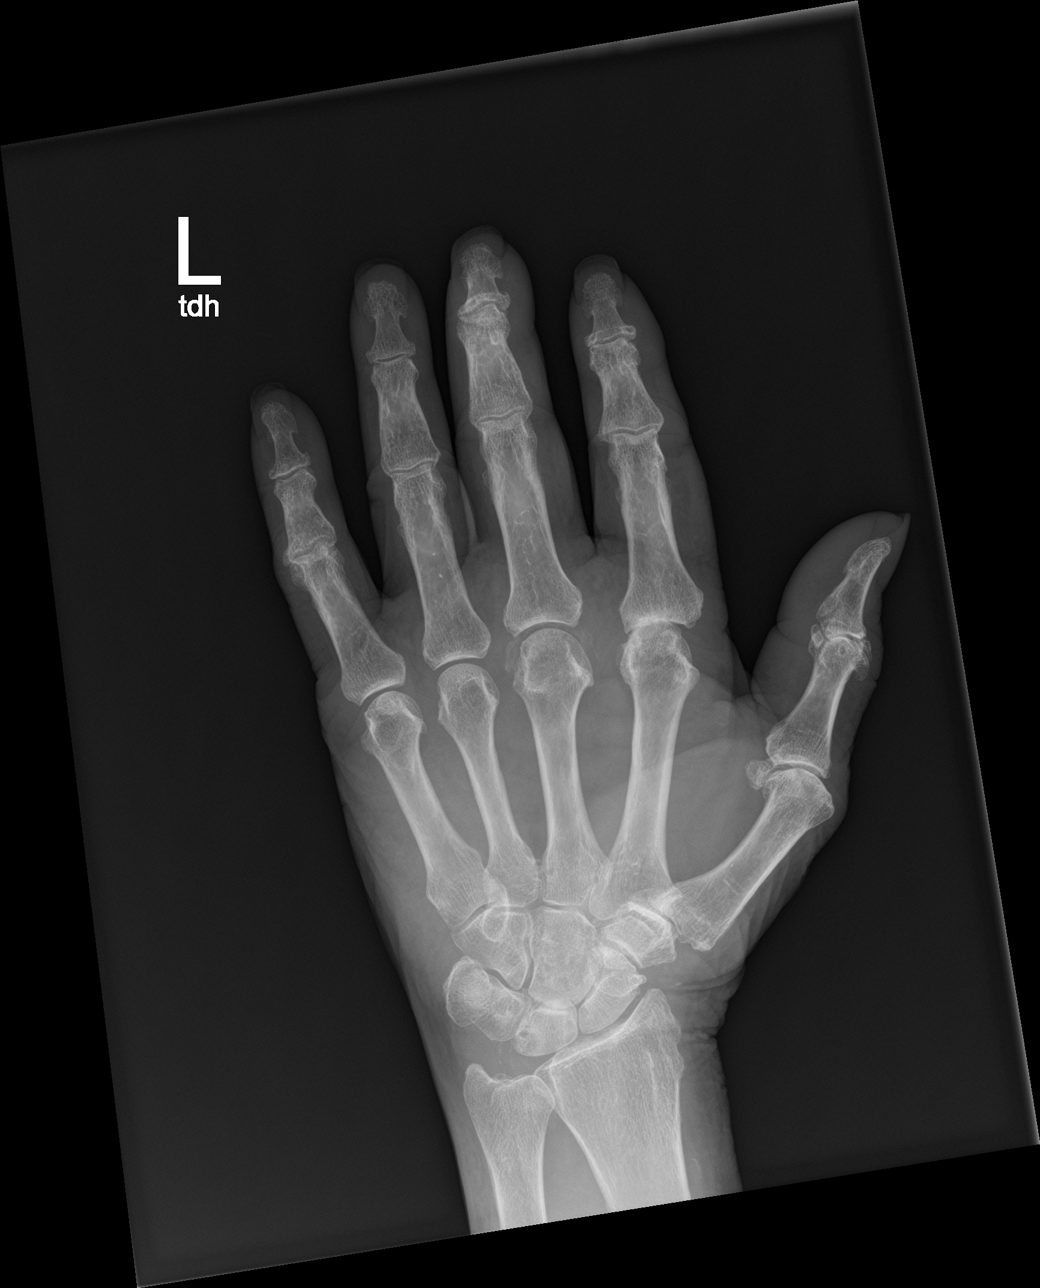

[hand obl]
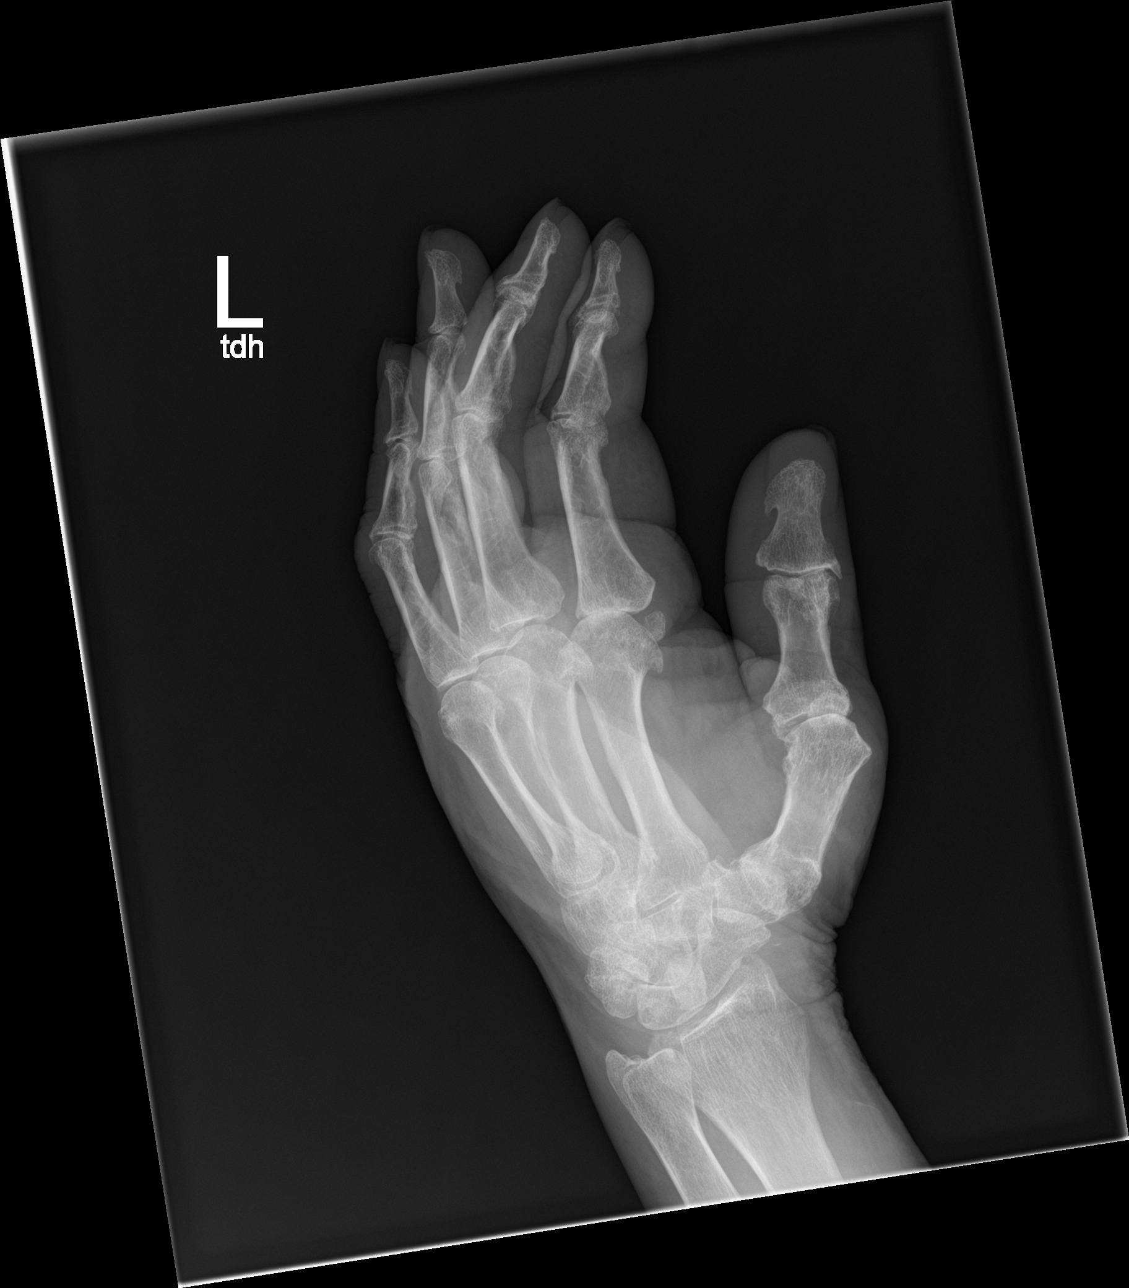

[hand lat]
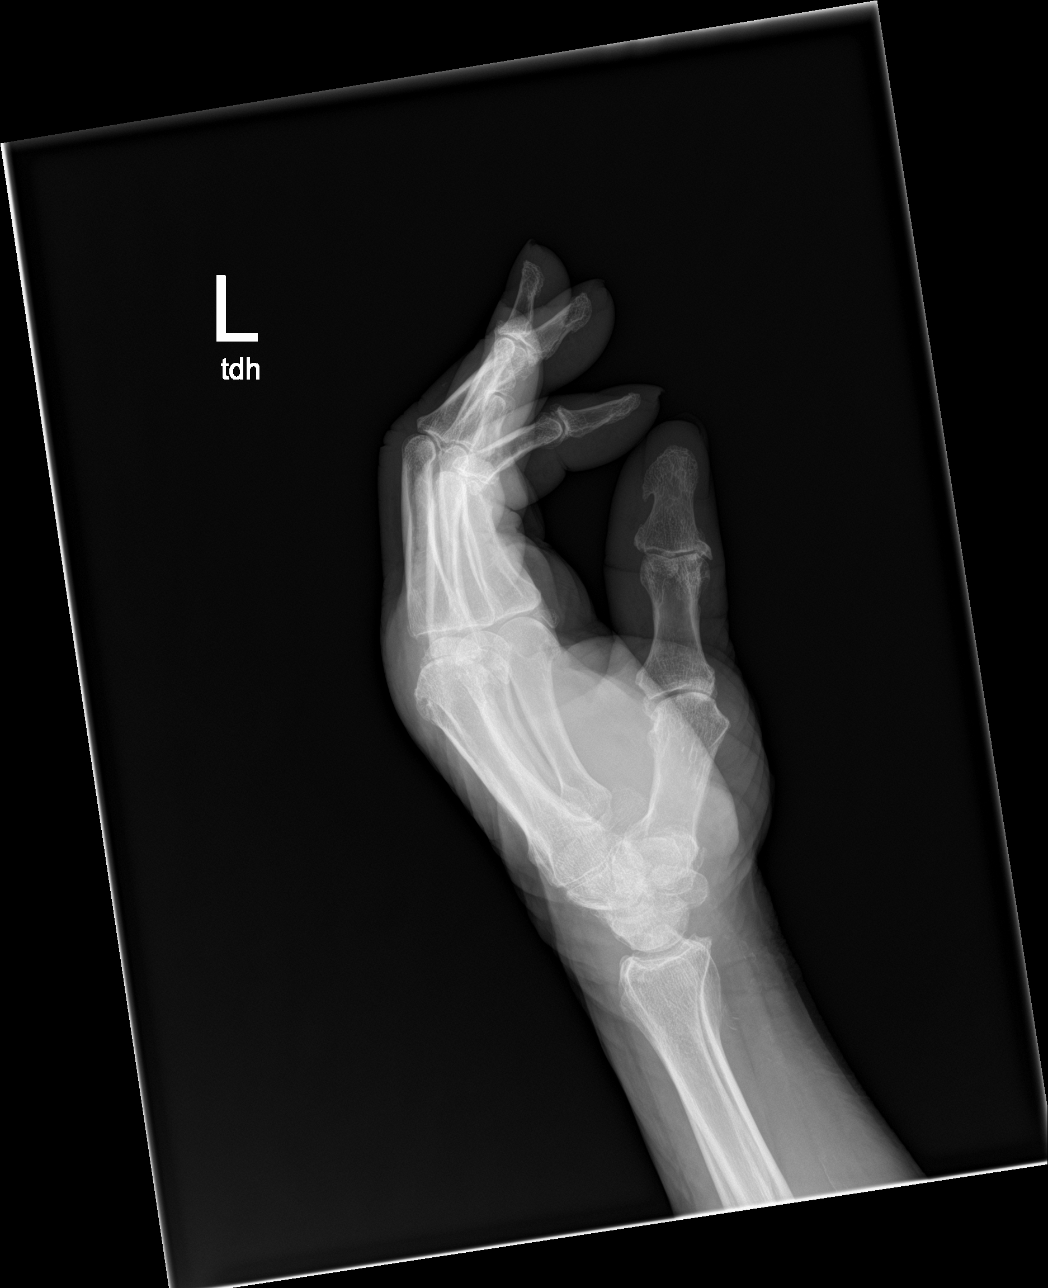

[3 of 3 positions shown; findings below may reference images not displayed]

FINDINGS: Distal radius and ulna appear stable and intact. There is
radiocarpal joint space loss. There is joint space loss between the
scaphoid and lunate. There is joint space loss with subchondral
sclerosis at the 2nd MCP joint. No periarticular erosions are
identified. The ulnar styloid is intact. Superimposed IP joint space
loss with osteophytosis, the 4th IP joints are least affected.

No acute osseous abnormality identified. Generalized soft tissue
swelling.
IMPRESSION: Multifocal chronic arthritis in the left hand and wrist appears
progressed since 5288. Generalized soft tissue swelling. No acute
osseous abnormality identified.

## 2019-09-27 MED ORDER — LACTULOSE ENEMA: 300.0000 mL | Freq: Two times a day (BID) | ORAL | Status: DC

## 2019-09-27 MED ORDER — LACTULOSE 10 GM/15ML PO SOLN: 30.0000 g | Freq: Three times a day (TID) | ORAL | Status: DC

## 2019-09-27 MED ORDER — POTASSIUM CHLORIDE 10 MEQ/100ML IV SOLN: 10.0000 meq | INTRAVENOUS | Status: DC

## 2019-09-27 MED ORDER — LACTULOSE ENEMA
300.0000 mL | Freq: Two times a day (BID) | ORAL | Status: DC
  Administered 2019-09-27: 300 mL via RECTAL
  Filled 2019-09-27 (×4): qty 300

## 2019-09-27 MED ORDER — ACETAMINOPHEN 160 MG/5ML PO SOLN: 325.0000 mg | Freq: Three times a day (TID) | ORAL | Status: DC

## 2019-09-27 MED ORDER — THIAMINE HCL 100 MG/ML IJ SOLN
100.0000 mg | Freq: Every day | INTRAMUSCULAR | Status: DC
  Administered 2019-09-27 – 2019-10-01 (×5): 100 mg via INTRAVENOUS
  Filled 2019-09-27 (×5): qty 2

## 2019-09-27 MED ORDER — DEXTROSE 5 % IV SOLN: INTRAVENOUS | Status: DC

## 2019-09-27 MED ORDER — ACETAMINOPHEN 10 MG/ML IV SOLN
1000.0000 mg | Freq: Two times a day (BID) | INTRAVENOUS | Status: AC
  Administered 2019-09-27 – 2019-09-28 (×2): 1000 mg via INTRAVENOUS
  Filled 2019-09-27 (×2): qty 100

## 2019-09-27 MED ORDER — LACTULOSE 10 GM/15ML PO SOLN
30.0000 g | Freq: Two times a day (BID) | ORAL | Status: DC
  Administered 2019-09-28 – 2019-09-29 (×3): 30 g
  Filled 2019-09-27 (×3): qty 45

## 2019-09-27 MED ORDER — OSMOLITE 1.2 CAL PO LIQD
1000.0000 mL | ORAL | Status: DC
  Administered 2019-09-28: 1000 mL
  Filled 2019-09-27 (×3): qty 1000

## 2019-09-27 MED ORDER — LACTULOSE ENEMA
300.0000 mL | Freq: Three times a day (TID) | ORAL | Status: DC
  Filled 2019-09-27 (×2): qty 300

## 2019-09-27 MED ORDER — POTASSIUM CHLORIDE 10 MEQ/100ML IV SOLN
10.0000 meq | INTRAVENOUS | Status: AC
  Administered 2019-09-27 (×4): 10 meq via INTRAVENOUS
  Filled 2019-09-27 (×4): qty 100

## 2019-09-27 NOTE — Progress Notes (Signed)
Brief Nutrition Note RD working remotely.   Consult received for enteral/tube feeding initiation and management.  Able to communicate with Palliative Care NP to confirm that family is desiring NGT placement and initiation of TF. She reports that family desires 72 hour trial of TF and to further assess GOC at that time. NP states she has ordered consult for NGT placement.   Adult Enteral Nutrition Protocol initiated. Full assessment to follow. Did note that patient had fecal impaction on admission. Will initiate Osmolite 1.2 @ 20 ml/hr and advance by 10 ml every 8 hours (slower advancement) to reach goal rate of 50 ml/hr. TF regimen to be adjusted when full assessment is completed.   Admitting Dx: GI bleed [K92.2] Elevated LFTs [R79.89] Symptomatic anemia [D64.9] Gastrointestinal hemorrhage, unspecified gastrointestinal hemorrhage type [K92.2]  Body mass index is 25.01 kg/m. Pt meets criteria for overweight status based on current BMI.  Labs:  Recent Labs  Lab 09/24/19 0559 09/24/19 1020 09/25/19 0927 09/26/19 0807 09/27/19 0322  NA 136   < > 139 141 146*  K 3.3*   < > 3.2* 4.1 2.9*  CL 106  --  113* 116* 117*  CO2 16*  --  18* 15* 20*  BUN 16  --  24* 28* 24*  CREATININE 1.71*  --  1.84* 1.62* 1.61*  CALCIUM 9.8  --  9.5 10.1 10.2  MG 1.8  --   --   --   --   GLUCOSE 104*  --  117* 108* 125*   < > = values in this interval not displayed.      Jarome Matin, MS, RD, LDN, CNSC Inpatient Clinical Dietitian RD pager # available in [place]  After hours/weekend pager # available in [facility]

## 2019-09-27 NOTE — Progress Notes (Signed)
SLP Cancellation Note  Patient Details Name: [name] MRN: [id number] DOB: 1941-01-25   Cancelled treatment:       Reason Eval/Treat Not Completed: Medical issues which prohibited therapy. Spoke with RN who stated that no change in patient's alertness and that multiple family members here to discuss and decide on goals of care. Will await palliative care meeting and check in with patient for readiness to trial PO's.   Sonia Baller, MA, CCC-SLP Speech Therapy [facility] Acute Rehab

## 2019-09-27 NOTE — Progress Notes (Signed)
PROGRESS NOTE    [name]  QQP:[id number] DOB: [birth year] DOA: 09/23/2019 PCP: Nicolette Bang, DO   Brief Narrative: 79 year old with past medical history significant for paroxysmal A. fib on low-dose aspirin but not on anticoagulation due to chronic anemia history of GI bleed, CAD, COPD, CVA, diabetes type 2, chronic diastolic heart failure, GERD history of gastric ulcer, urinary retention with chronic indwelling Foley catheter who presents from rehab for evaluation of generalized weakness and anemia.  Labs drawn at the facility show worsening anemia hemoglobin of 6.2.  Patient was very somnolent and difficult to arouse on admission.  He was able to say his name, say a few words.  Patient admitted with acute metabolic and cephalopathy, anemia, metabolic acidosis, increased ammonia level. He continued to be more lethargic today CCM consulted.    Assessment & Plan:   Principal Problem:   Symptomatic anemia Active Problems:   Diabetes mellitus, type II (HCC)   Diastolic CHF, chronic (HCC)   GI bleed   Hypokalemia   Acute encephalopathy   1-Symptomatic acute blood loss anemia presumed to occult GI bleed: 70 hemoglobin of 6.  At baseline 8-9 Occult blood positive. Received 1 unit of packed red blood cell 6-30. And second unit 7/01 CT with presumed fecal impaction.  Tapwater enema ordered GI has been consulted. Hb stable 8---7.9 Evaluated by GI, not a candidate for endoscopy.  Continue with IV Protonix Monitor hb.   2-Acute metabolic encephalopathy: Patient presented with altered mental status.  Keep eyes closed, say a few words. ABG pH with respiratory alkalosis.  Metabolic acidosis on the vent Support care Protecting airway Ammonia level elevated at 88 today.  Continue with lactulose enema BID.  EEG negative for seizure. Unable to do MRI due to pacemaker.  Appreciate neurology evaluation.  Sleepy this am. Continue to monitor.   3-Sepsis secondary to   CAUTI; patient has chronic Foley catheter due to urinary retention  Patient febrile hypotensive. Continue with fluids.  IV antibiotics. Plan to change cefepime to Zosyn (7/02) to cover: Citrobacter and enterococcus Faecalis.  With more than 50 white blood cell.  Continue with Zosyn day 2.   4-Transaminases: Right upper quadrant ultrasound; Status post cholecystectomy. No biliary dilatation. Unremarkable sonographic appearance of the liver. Might be related to sepsis.   Hyperammonemia; might be related to bacteria overgrowth. Liver US normal appearance of liver.  Continue with lactulose.  Trending down.   Paroxysmal A. fib holding aspirin due to concern for GI bleed Acute on CKD stage III b; Prior Cr 1.4--1.5  Presents with cr at 1.8. Improved with IV fluids.  Improving. Cr down to 1. 6  Diabetes: Continue with sliding scale insulin Chronic diastolic heart failure: Chest x-ray with progressive central vascular congestion.  Monitor for pulmonary edema on IVF. B/L ronchus; started  nebulizer Thrombocytopenia; might be related to acute illness. Stable.  Hypernatremia; change fluids to D 5.  Hypokalemia; Replete IV.  Metabolic acidosis; related to infection, AKI, improved with Bicarb gtt.   Estimated body mass index is 25.01 kg/m as calculated from the following:   Height as of 09/22/19: 5\' 6"  (1.676 m).   Weight as of this encounter: 70.3 kg.   DVT prophylaxis: SCDs no anticoagulation due to GI bleed Code Status: Full code Family Communication: Care discussed with daughter who was at bedside. 7/02 Disposition Plan:  Status is: Inpatient  Remains inpatient appropriate because:Hemodynamically unstable   Dispo: The patient is from: SNF  Anticipated d/c is to: SNF              Anticipated d/c date is: 3 days              Patient currently is not medically stable to d/c.  Still lethargic encephalopathic requiring blood transfusion IV  antibiotics        Consultants:   CCM  Neurology  Procedures:   EEG  Antimicrobials:  Cefepime  Subjective: Patient is sleepy.   Objective: Vitals:   09/26/19 1500 09/26/19 1900 09/26/19 2300 09/27/19 0000  BP: 118/68 113/64 124/63 112/62  Pulse: 99 99 86 85  Resp: 20 20 (!) 21 19  Temp: 99.3 F (37.4 C) 98 F (36.7 C) 98 F (36.7 C) 97.9 F (36.6 C)  TempSrc: Oral Oral Oral Oral  SpO2: 90% 93% 95% 94%  Weight:        Intake/Output Summary (Last 24 hours) at 09/27/2019 0753 Last data filed at 09/27/2019 7672 Gross per 24 hour  Intake 1080.96 ml  Output 900 ml  Net 180.96 ml   Filed Weights   09/24/19 2042  Weight: 70.3 kg    Examination:  General exam: Sleepy Respiratory system: B/L ronchus Cardiovascular system: S 1, S 2 RRR Gastrointestinal system: BS present, soft, nt Central nervous system: sleepy this morning Extremities: trace edema   Data Reviewed: I have personally reviewed following labs and imaging studies  CBC: Recent Labs  Lab 09/23/19 2054 09/23/19 2054 09/24/19 0559 09/24/19 0559 09/24/19 1020 09/25/19 0927 09/25/19 2038 09/26/19 1004 09/27/19 0322  WBC 4.8  --  4.9  --   --  7.6  --  4.2 3.4*  NEUTROABS 2.6  --   --   --   --   --   --   --   --   HGB 6.2*   < > 7.4*   < > 7.8* 6.6* 8.0* 8.1* 7.9*  HCT 20.5*   < > 24.5*   < > 23.0* 22.4* 26.3* 26.5* 25.8*  MCV 92.3  --  91.4  --   --  93.3  --  92.7 92.8  PLT 111*  --  123*  --   --  84*  --  77* 75*   < > = values in this interval not displayed.   Basic Metabolic Panel: Recent Labs  Lab 09/23/19 2054 09/23/19 2054 09/24/19 0559 09/24/19 1020 09/25/19 0927 09/26/19 0807 09/27/19 0322  NA 136   < > 136 141 139 141 146*  K 3.2*   < > 3.3* 2.9* 3.2* 4.1 2.9*  CL 104  --  106  --  113* 116* 117*  CO2 20*  --  16*  --  18* 15* 20*  GLUCOSE 106*  --  104*  --  117* 108* 125*  BUN 15  --  16  --  24* 28* 24*  CREATININE 1.41*  --  1.71*  --  1.84* 1.62* 1.61*   CALCIUM 9.5  --  9.8  --  9.5 10.1 10.2  MG  --   --  1.8  --   --   --   --    < > = values in this interval not displayed.   GFR: Estimated Creatinine Clearance: 33 mL/min (A) (by C-G formula based on SCr of 1.61 mg/dL (H)). Liver Function Tests: Recent Labs  Lab 09/23/19 2054 09/24/19 0559 09/25/19 0927 09/27/19 0322  AST 82* 98* 87* 98*  ALT 50* 48* 49* 54*  ALKPHOS 146* 161* 119 144*  BILITOT 0.7 1.4* 1.2 1.4*  PROT 9.1* 8.8* 8.3* 8.1  ALBUMIN 1.8* 1.8* 1.6* 1.4*   No results for input(s): LIPASE, AMYLASE in the last 168 hours. Recent Labs  Lab 09/24/19 1823 09/25/19 0927 09/26/19 0807 09/27/19 0322  AMMONIA 66* 86* 68* 61*   Coagulation Profile: No results for input(s): INR, PROTIME in the last 168 hours. Cardiac Enzymes: No results for input(s): CKTOTAL, CKMB, CKMBINDEX, TROPONINI in the last 168 hours. BNP (last 3 results) No results for input(s): PROBNP in the last 8760 hours. HbA1C: No results for input(s): HGBA1C in the last 72 hours. CBG: Recent Labs  Lab 09/26/19 1110 09/26/19 1607 09/26/19 2043 09/27/19 0052 09/27/19 0414  GLUCAP 117* 108* 130* 119* 122*   Lipid Profile: No results for input(s): CHOL, HDL, LDLCALC, TRIG, CHOLHDL, LDLDIRECT in the last 72 hours. Thyroid Function Tests: No results for input(s): TSH, T4TOTAL, FREET4, T3FREE, THYROIDAB in the last 72 hours. Anemia Panel: Recent Labs    09/24/19 0822  VITAMINB12 854  FOLATE 12.1  FERRITIN 47  TIBC 330  IRON 36*  RETICCTPCT 3.6*   Sepsis Labs: Recent Labs  Lab 09/25/19 0927 09/26/19 0253 09/27/19 0322  PROCALCITON 1.63 1.31 0.92  LATICACIDVEN 1.8  --   --     Recent Results (from the past 240 hour(s))  Urine Culture     Status: Abnormal (Preliminary result)   Collection Time: 09/23/19 11:48 PM   Specimen: Urine, Random  Result Value Ref Range Status   Specimen Description URINE, RANDOM  Final   Special Requests NONE  Final   Culture (A)  Final    >=100,000  COLONIES/mL CITROBACTER FREUNDII >=100,000 COLONIES/mL ENTEROCOCCUS FAECALIS CULTURE REINCUBATED FOR BETTER GROWTH Performed at [facility], [place] [address], [place], [place] [address]    Report Status PENDING  Incomplete   Organism ID, Bacteria CITROBACTER FREUNDII (A)  Final      Susceptibility   Citrobacter freundii - MIC*    CEFAZOLIN >=64 RESISTANT Resistant     CEFTRIAXONE <=0.25 SENSITIVE Sensitive     CIPROFLOXACIN <=0.25 SENSITIVE Sensitive     GENTAMICIN <=1 SENSITIVE Sensitive     IMIPENEM <=0.25 SENSITIVE Sensitive     NITROFURANTOIN <=16 SENSITIVE Sensitive     TRIMETH/SULFA <=20 SENSITIVE Sensitive     PIP/TAZO <=4 SENSITIVE Sensitive     * >=100,000 COLONIES/mL CITROBACTER FREUNDII  SARS Coronavirus 2 by RT PCR (hospital order, performed in [place] hospital lab) Nasopharyngeal Nasopharyngeal Swab     Status: None   Collection Time: 09/24/19 12:56 AM   Specimen: Nasopharyngeal Swab  Result Value Ref Range Status   SARS Coronavirus 2 NEGATIVE NEGATIVE Final    Comment: (NOTE) SARS-CoV-2 target nucleic acids are NOT DETECTED.  The SARS-CoV-2 RNA is generally detectable in upper and lower respiratory specimens during the acute phase of infection. The lowest concentration of SARS-CoV-2 viral copies this assay can detect is 250 copies / mL. A negative result does not preclude SARS-CoV-2 infection and should not be used as the sole basis for treatment or other patient management decisions.  A negative result may occur with improper specimen collection / handling, submission of specimen other than nasopharyngeal swab, presence of viral mutation(s) within the areas targeted by this assay, and inadequate number of viral copies (<250 copies / mL). A negative result must be combined with clinical observations, patient history, and epidemiological information.  Fact Sheet for Patients:   StrictlyIdeas.no  Fact Sheet  for Healthcare  Providers: BankingDealers.co.za  This test is not yet approved or  cleared by the [place] and has been authorized for detection and/or diagnosis of SARS-CoV-2 by FDA under an Emergency Use Authorization (EUA).  This EUA will remain in effect (meaning this test can be used) for the duration of the COVID-19 declaration under Section 564(b)(1) of the Act, 21 U.S.C. section 360bbb-3(b)(1), unless the authorization is terminated or revoked sooner.  Performed at [facility], [place] [address], [place], [place] [address]   Culture, blood (routine x 2)     Status: None (Preliminary result)   Collection Time: 09/24/19  7:12 AM   Specimen: BLOOD LEFT FOREARM  Result Value Ref Range Status   Specimen Description BLOOD LEFT FOREARM  Final   Special Requests   Final    BOTTLES DRAWN AEROBIC ONLY Blood Culture adequate volume   Culture   Final    NO GROWTH 2 DAYS Performed at [facility], [place] [address] [place], [place] [address]    Report Status PENDING  Incomplete  Culture, blood (routine x 2)     Status: None (Preliminary result)   Collection Time: 09/24/19  8:22 AM   Specimen: BLOOD  Result Value Ref Range Status   Specimen Description BLOOD SITE NOT SPECIFIED  Final   Special Requests   Final    BOTTLES DRAWN AEROBIC AND ANAEROBIC Blood Culture results may not be optimal due to an excessive volume of blood received in culture bottles   Culture   Final    NO GROWTH 2 DAYS Performed at [facility], [place] [address] [place],  [address]    Report Status PENDING  Incomplete         Radiology Studies: EEG adult  Result Date: October 13, 2019 Antionette Poles, MD     2019/10/13 11:59 AM [facility] Routine Inpatient Electroencephalogram (EEG) Indication: Encephalopathy Date of Study: 09/25/2019 Brief History: 79 year old white male presenting with altered mental status and recurrent falls.  He was found to  have a downward gaze. Description:  This is a routine inpatient EEG using the International Standard 10-20 system of electrode placement. Photic stimulation and hyperventilation were deferred. The background showed theta slowing at 4-6 hertz, with also diffuse theta slowing as well. There were no definitive epileptiform discharges or electrographic seizures seen. Impression:  This is an abnormal EEG due to the presence of moderate diffuse slowing.  Findings are suggestive of an underlying encephalopathy possibly related to toxic, metabolic conditions, versus diffuse structural brain abnormalities.  The absence of epileptiform discharges does not necessarily rule out an underlying seizure disorder.  Clinical correlation is advised.        Scheduled Meds:  Chlorhexidine Gluconate Cloth  6 each Topical Daily   insulin aspart  0-9 Units Subcutaneous Q4H   lactulose  300 mL Rectal BID   pantoprazole (PROTONIX) IV  40 mg Intravenous Q12H   tamsulosin  0.4 mg Oral Daily   Continuous Infusions:  dextrose     diltiazem (CARDIZEM) infusion Stopped (09/24/19 1848)   piperacillin-tazobactam (ZOSYN)  IV Stopped (09/27/19 0559)   potassium chloride       LOS: 3 days    Time spent: 35 minutes.     Elmarie Shiley, MD [facility]   If 7PM-7AM, please contact night-coverage [email or web address]  09/27/2019, 7:53 AM

## 2019-09-27 NOTE — Evaluation (Signed)
Physical Therapy Evaluation Patient Details Name: [name] MRN: [id number] DOB: 01/06/1941 Today's Date: 09/27/2019   History of Present Illness  79 year old with past medical history significant for paroxysmal A. fib on low-dose aspirin but not on anticoagulation due to chronic anemia history of GI bleed, CAD, COPD, CVA, diabetes type 2, chronic diastolic heart failure, GERD history of gastric ulcer, urinary retention with chronic indwelling Foley catheter who presents from SNF for evaluation of generalized weakness and anemia. Patient admitted with acute metabolic and cephalopathy, anemia, metabolic acidosis     Clinical Impression  Pt admitted with above diagnosis. PTA pt resided at [facility]. On eval, pt received in bed, very lethargic, primarily only responding to pain.  Eyes closed during session with pt moaning during ROM or mobility attempts. LEs noted to be limited in flexion and abduction. Pt requires total assist bed mobility. Pt currently with functional limitations due to the deficits listed below (see PT Problem List). Pt will benefit from skilled PT to increase their independence and safety with mobility to allow discharge to the venue listed below.  PT to follow pt for trial period to further assess potential for progress with mobility.      Follow Up Recommendations SNF    Equipment Recommendations  None recommended by PT    Recommendations for Other Services       Precautions / Restrictions Precautions Precautions: Fall Restrictions Weight Bearing Restrictions: No      Mobility  Bed Mobility Overal bed mobility: Needs Assistance             General bed mobility comments: total assist  Transfers                 General transfer comment: unable to attempt   Ambulation/Gait                Stairs            Wheelchair Mobility    Modified Rankin (Stroke Patients Only)       Balance                                              Pertinent Vitals/Pain Pain Assessment: Faces Faces Pain Scale: Hurts even more Pain Location: generalized with ROM or mobility Pain Descriptors / Indicators: Discomfort;Moaning;Grimacing;Guarding Pain Intervention(s): Limited activity within patient's tolerance;Repositioned    Home Living Family/patient expects to be discharged to:: Skilled nursing facility                 Additional Comments: [name]    Prior Function Level of Independence: Needs assistance   Gait / Transfers Assistance Needed: reports needing assist   ADL's / Homemaking Assistance Needed: assist for ADL  Comments: unsure of mobility status or assist level needed at SNF     Hand Dominance   Dominant Hand: Right    Extremity/Trunk Assessment   Upper Extremity Assessment Upper Extremity Assessment: Defer to OT evaluation    Lower Extremity Assessment Lower Extremity Assessment: Generalized weakness;Difficult to assess due to impaired cognition (grimacing, moaning, and resisting ROM attempts)       Communication   Communication: Expressive difficulties;Other (comment) (garbled speech/mumbling)  Cognition Arousal/Alertness: Lethargic Behavior During Therapy: Flat affect Overall Cognitive Status: Difficult to assess  General Comments: Little to no verbal interaction. Moaning in response to pain. Not following commands. Eyes closed.      General Comments General comments (skin integrity, edema, etc.): VSS on 2L O2    Exercises Other Exercises Other Exercises: gentle PROM to bil UEs within pt tolerance  Other Exercises: heel/cord stretch bil LE   Assessment/Plan    PT Assessment Patient needs continued PT services  PT Problem List Decreased strength;Decreased mobility;Decreased activity tolerance;Decreased cognition;Pain;Decreased balance       PT Treatment Interventions Therapeutic activities;Cognitive  remediation;Patient/family education;Therapeutic exercise;Balance training;Functional mobility training;Gait training    PT Goals (Current goals can be found in the Care Plan section)  Acute Rehab PT Goals Patient Stated Goal: none stated  PT Goal Formulation: Patient unable to participate in goal setting Time For Goal Achievement: 10/11/19 Potential to Achieve Goals: Poor    Frequency Min 2X/week   Barriers to discharge        Co-evaluation               AM-PAC PT "6 Clicks" Mobility  Outcome Measure Help needed turning from your back to your side while in a flat bed without using bedrails?: Total Help needed moving from lying on your back to sitting on the side of a flat bed without using bedrails?: Total Help needed moving to and from a bed to a chair (including a wheelchair)?: Total Help needed standing up from a chair using your arms (e.g., wheelchair or bedside chair)?: Total Help needed to walk in hospital room?: Total Help needed climbing 3-5 steps with a railing? : Total 6 Click Score: 6    End of Session Equipment Utilized During Treatment: Oxygen Activity Tolerance: Patient limited by pain;Patient limited by lethargy Patient left: in bed;with call bell/phone within reach;with bed alarm set Nurse Communication: Mobility status PT Visit Diagnosis: Muscle weakness (generalized) (M62.81);Pain;Other abnormalities of gait and mobility (R26.89)    Time: 7867-5449 PT Time Calculation (min) (ACUTE ONLY): 11 min   Charges:   PT Evaluation $PT Eval Moderate Complexity: 1 Mod          Lorrin Goodell, PT  Office # [phone] Pager [phone]   Lorriane Shire 09/27/2019, 2:42 PM

## 2019-09-27 NOTE — Progress Notes (Signed)
Palliative Medicine Inpatient Consult Note  Reason for consult:  Goals Of Care  HPI:  Per intake H&P --> Trevor Powellis a 79 y.o.malewith medical history significant ofparoxysmal A. fib on low-dose aspirin but not anticoagulation due to chronic anemia and history of GI bleed, CAD, COPD, CVA, non-insulin-dependent type 2 diabetes, chronic diastolic CHF, hypertension, hyperlipidemia, GERD, history of gastric ulcer, history of urinary retention with a chronic indwelling Foley catheter presenting to the ED from his rehab facility for evaluation of generalized weakness and anemia. Labs drawn at the facility revealed worsening anemia with hemoglobin 6.2.  Palliative care was asked to aid in goals of care conversations.  Today's Discussion (09/27/2019): Chart reviewed. I met with [name] (daughter), [name] (daughter), [name] (daughters boyfriend), [name] (sons wife), and [name].   I introduced Palliative Medicine as specialized medical care for people living with serious illness. It focuses on providing relief from the symptoms and stress of a serious illness. The goal is to improve quality of life for both the patient and the family.  I asked [name] what they understood about his present illness. His family shared that [name] had been less responsive at [facility]. He was taken in and found to have a recurrence of his UTI. They felt that his ongoing mental decline was related to his ammonia levels. We discussed ammonia levels in greater detail and how there are various potential causes for why he may have had an increase. We reviewed laboratory results and imaging studies. On of the recurring questions the patients family had was how was he doing at [facility]? I called this facility to gain more insights though I was unable to get through to anyone.   I shared with [name] that per my discussion with their sister, [name] who he lives with that [name] had been declining  over the last year or so. He has had worsening urinary infections since the placement of his long term urinary catheter. We discussed reasons why this could be in terms of any instrumentation or device would cause a migration of bacteria. We discussed the difficulty in treating these infections given increasing antibiotic resistance.   I brought to the attending of Capps children the MOST form. I shared with them that it is important that we talk about resuscitation. I shared my concern for causing more harm than true benefit.   We discussed the patients somnolence and poor nutritional state in the setting of ongoing delirium. Capps children had asked if Dr. Tyrell [name] could join the meeting in order for them to get a more defined medical update from her. I was able to reach her and she was able to come by.  Dr. Tyrell [name] introduced herself and again reviewed Capps present situation. We discussed etiologies as to why he may be less responsive and the serious nature of his disease processes.   We further discussed DNR status which the family elected would be the best decision for their father.   In terms of artificial feeing. Patients son, [name]. Stated that he believes hi father would wish for them to continue with a trial of nutrition and enable the team to get his medications in him more regularly. He states hope for improvement. We further talked about a times trial to see if [name] turns the corner. We agreed to a 72 hour trial of NGT feeding to see if any improvements are made.  Patients daughter, [name] plans to stay at bedside to help reorient patient and  deter him from pulling at his NGT.   Patients bedside RN, [name] updated on the plan.   SUMMARY OF RECOMMENDATIONS DNAR/DNI  Trial of NGT feeding for 72 hours to see if patient improves  Dietary consult for TF recs.   Ongoing PMT support and [name] conversations  This conversation/these recommendations were discussed with patient  primary care team, Dr. Tyrell [name]  Time In: 1200 Time Out: 1345 Total Time: 105 Greater than 50%  of this time was spent counseling and coordinating care related to the above assessment and plan.  [place] Team Team Cell Phone: [phone] Please utilize secure chat with additional questions, if there is no response within 30 minutes please call the above phone number  Palliative Medicine Team providers are available by phone from 7am to 7pm daily and can be reached through the team cell phone.  Should this patient require assistance outside of these hours, please call the patient's attending physician.

## 2019-09-27 NOTE — Evaluation (Addendum)
Occupational Therapy Evaluation Patient Details Name: [name] MRN: [id number] DOB: 12-Jan-1941 Today's Date: 09/27/2019    History of Present Illness 79 year old with past medical history significant for paroxysmal A. fib on low-dose aspirin but not on anticoagulation due to chronic anemia history of GI bleed, CAD, COPD, CVA, diabetes type 2, chronic diastolic heart failure, GERD history of gastric ulcer, urinary retention with chronic indwelling Foley catheter who presents from SNF for evaluation of generalized weakness and anemia. Patient admitted with acute metabolic and cephalopathy, anemia, metabolic acidosis    Clinical Impression   This 79 y/o male presents with the above. PTA pt residing at [facility], per most recent hospital admission (May 2021) pt was receiving assist for mobility/ADL. Pt very lethargic this session, will arouse to voice but with difficulty remaining awake/alert. Pt with mumbled/unintelligible speech when attempting to speak to therapist and unable to follow simple commands during session. Pt grimacing with certain aspects of bil UE PROM, repositioned at bed level for edema management and comfort. He is currently totalA for ADL. Will continue to follow on a trial basis with acute OT to further assess ADL needs as well as address any positioning needs PRN (noted pt also being followed by palliative at this time). Recommend pt return to previous SNF at time of discharge.     Follow Up Recommendations  SNF    Equipment Recommendations  Other (comment) (defer to next venue)           Precautions / Restrictions Precautions Precautions: Fall Restrictions Weight Bearing Restrictions: No      Mobility Bed Mobility Overal bed mobility: Needs Assistance             General bed mobility comments: totalA for repositioning  Transfers                 General transfer comment: unable to attempt     Balance                                            ADL either performed or assessed with clinical judgement   ADL Overall ADL's : Needs assistance/impaired                                       General ADL Comments: currently totalA                         Pertinent Vitals/Pain Pain Assessment: Faces Faces Pain Scale: Hurts little more Pain Location: generalized with UE ROM, repositioning in bed  Pain Descriptors / Indicators: Discomfort;Moaning;Grimacing Pain Intervention(s): Monitored during session;Repositioned     Hand Dominance     Extremity/Trunk Assessment Upper Extremity Assessment Upper Extremity Assessment: Generalized weakness;Difficult to assess due to impaired cognition (pt appears uncomfortable with PROM attempts, grimacing)   Lower Extremity Assessment Lower Extremity Assessment: Defer to PT evaluation       Communication Communication Communication: Expressive difficulties;Other (comment) (mumbled speech)   Cognition Arousal/Alertness: Lethargic Behavior During Therapy: Flat affect Overall Cognitive Status: Difficult to assess                                 General Comments: pt intermittently arouses to therapist but with mumbled/unintelligible  speech, no command follow    General Comments  VSS    Exercises Exercises: Other exercises Other Exercises Other Exercises: gentle PROM to bil UEs within pt tolerance  Other Exercises: heel/cord stretch bil LE   Shoulder Instructions      Home Living Family/patient expects to be discharged to:: Skilled nursing facility                                 Additional Comments: [place]      Prior Functioning/Environment Level of Independence: Needs assistance  Gait / Transfers Assistance Needed: reports needing assist  ADL's / Homemaking Assistance Needed: assist for ADL   Comments: PLOF obtained from previous admission in May 2021        OT Problem List: Decreased strength;Decreased  range of motion;Decreased activity tolerance;Impaired balance (sitting and/or standing);Impaired UE functional use;Decreased cognition;Pain;Increased edema      OT Treatment/Interventions: Self-care/ADL training;Therapeutic exercise;Neuromuscular education;Therapeutic activities;Cognitive remediation/compensation;Patient/family education;Balance training    OT Goals(Current goals can be found in the care plan section) Acute Rehab OT Goals Patient Stated Goal: none stated  OT Goal Formulation: Patient unable to participate in goal setting Time For Goal Achievement: 10/11/19 Potential to Achieve Goals: Fair  OT Frequency: Min 1X/week   Barriers to D/C:            Co-evaluation              AM-PAC OT "6 Clicks" Daily Activity     Outcome Measure Help from another person eating meals?: Total Help from another person taking care of personal grooming?: Total Help from another person toileting, which includes using toliet, bedpan, or urinal?: Total Help from another person bathing (including washing, rinsing, drying)?: Total Help from another person to put on and taking off regular upper body clothing?: Total Help from another person to put on and taking off regular lower body clothing?: Total 6 Click Score: 6   End of Session Equipment Utilized During Treatment: Oxygen Nurse Communication: Mobility status  Activity Tolerance: Patient limited by lethargy Patient left: in bed;with call bell/phone within reach  OT Visit Diagnosis: Muscle weakness (generalized) (M62.81);Other abnormalities of gait and mobility (R26.89);Other symptoms and signs involving cognitive function                Time: 5364-6803 OT Time Calculation (min): 9 min Charges:  OT General Charges $OT Visit: 1 Visit OT Evaluation $OT Eval Moderate Complexity: [place], OT Acute Rehabilitation Services Pager [phone] Office [phone]   Raymondo Band 09/27/2019, 1:44 PM

## 2019-09-28 ENCOUNTER — Inpatient Hospital Stay (HOSPITAL_COMMUNITY): Payer: Medicare Other

## 2019-09-28 LAB — CBC
HCT: 24.7 % — ABNORMAL LOW (ref 39.0–52.0)
Hemoglobin: 7.5 g/dL — ABNORMAL LOW (ref 13.0–17.0)
MCH: 28.5 pg (ref 26.0–34.0)
MCHC: 30.4 g/dL (ref 30.0–36.0)
MCV: 93.9 fL (ref 80.0–100.0)
Platelets: 74 10*3/uL — ABNORMAL LOW (ref 150–400)
RBC: 2.63 MIL/uL — ABNORMAL LOW (ref 4.22–5.81)
RDW: 23.6 % — ABNORMAL HIGH (ref 11.5–15.5)
WBC: 3.7 10*3/uL — ABNORMAL LOW (ref 4.0–10.5)
nRBC: 1.1 % — ABNORMAL HIGH (ref 0.0–0.2)

## 2019-09-28 LAB — BASIC METABOLIC PANEL
Anion gap: 8 (ref 5–15)
BUN: 18 mg/dL (ref 8–23)
CO2: 20 mmol/L — ABNORMAL LOW (ref 22–32)
Calcium: 9.6 mg/dL (ref 8.9–10.3)
Chloride: 113 mmol/L — ABNORMAL HIGH (ref 98–111)
Creatinine, Ser: 1.62 mg/dL — ABNORMAL HIGH (ref 0.61–1.24)
GFR calc Af Amer: 46 mL/min — ABNORMAL LOW (ref >60)
GFR calc non Af Amer: 39 mL/min — ABNORMAL LOW (ref >60)
Glucose, Bld: 110 mg/dL — ABNORMAL HIGH (ref 70–99)
Potassium: 2.6 mmol/L — CL (ref 3.5–5.1)
Sodium: 141 mmol/L (ref 135–145)

## 2019-09-28 LAB — GLUCOSE, CAPILLARY
Glucose-Capillary: 112 mg/dL — ABNORMAL HIGH (ref 70–99)
Glucose-Capillary: 115 mg/dL — ABNORMAL HIGH (ref 70–99)
Glucose-Capillary: 116 mg/dL — ABNORMAL HIGH (ref 70–99)
Glucose-Capillary: 132 mg/dL — ABNORMAL HIGH (ref 70–99)
Glucose-Capillary: 140 mg/dL — ABNORMAL HIGH (ref 70–99)
Glucose-Capillary: 150 mg/dL — ABNORMAL HIGH (ref 70–99)

## 2019-09-28 LAB — URINE CULTURE: Culture: >=100000 — AB

## 2019-09-28 LAB — AMMONIA: Ammonia: 49 umol/L — ABNORMAL HIGH (ref 9–35)

## 2019-09-28 MED ORDER — MAGNESIUM SULFATE 2 GM/50ML IV SOLN
2.0000 g | Freq: Once | INTRAVENOUS | Status: AC
  Administered 2019-09-28: 2 g via INTRAVENOUS
  Filled 2019-09-28: qty 50

## 2019-09-28 MED ORDER — IOHEXOL 300 MG/ML  SOLN
25.0000 mL | Freq: Once | INTRAMUSCULAR | Status: AC | PRN
  Administered 2019-09-28: 25 mL via ORAL

## 2019-09-28 MED ORDER — POTASSIUM CHLORIDE 10 MEQ/100ML IV SOLN
10.0000 meq | INTRAVENOUS | Status: AC
  Administered 2019-09-28 (×4): 10 meq via INTRAVENOUS
  Filled 2019-09-28 (×4): qty 100

## 2019-09-28 MED ORDER — FERROUS SULFATE 75 (15 FE) MG/ML PO SOLN
15.0000 mg | Freq: Two times a day (BID) | ORAL | Status: DC
  Administered 2019-09-28 – 2019-10-01 (×7): 15 mg via ORAL
  Filled 2019-09-28 (×9): qty 1

## 2019-09-28 MED ORDER — LIDOCAINE VISCOUS HCL 2 % MT SOLN: 15.0000 mL | Freq: Once | OROMUCOSAL | Status: DC

## 2019-09-28 MED ORDER — LIDOCAINE VISCOUS HCL 2 % MT SOLN
OROMUCOSAL | Status: AC
  Filled 2019-09-28: qty 15

## 2019-09-28 NOTE — Progress Notes (Signed)
   Palliative Medicine Inpatient Follow Up Note   Reason for consult:Goals Of Care  HPI: Per intake H&P -->Trevor Powellis a 79 y.o.malewith medical history significant ofparoxysmal A. fib on low-dose aspirin but not anticoagulation due to chronic anemia and history of GI bleed, CAD, COPD, CVA, non-insulin-dependent type 2 diabetes, chronic diastolic CHF, hypertension, hyperlipidemia, GERD, history of gastric ulcer, history of urinary retention with a chronic indwelling Foley catheter presenting to the ED from his rehab facility for evaluation of generalized weakness and anemia. Labs drawn at the facility revealed worsening anemia with hemoglobin 6.2.  Palliative care was asked to aid in goals of care conversations.  Today's Discussion (09/28/2019): Chart reviewed. I met with bedside RN, Shanon Brow this morning. Unable to get NGT in yesterday plan for radiology to complete this today. Will observe for 72 hours to see if patient makes any improvements. If not we will further discuss the option of hospice. This I complicated by the patients daughter, [name] being unable to care for her. [name] would likely need to transition to a facility versus home with hospice in [place] near to his son if he does not improve.   Discussed the importance of continued conversation with family and their  medical providers regarding overall plan of care and treatment options, ensuring decisions are within the context of the patients values and GOCs.  Provided "Hard Choices for [employer]" booklet.   Questions and concerns addressed   Vital Signs Vitals:   09/28/19 0400 09/28/19 0810  BP: (!) 101/54 117/70  Pulse: 70 82  Resp: 20 18  Temp: 99 F (37.2 C) 98.5 F (36.9 C)  SpO2: 93% 94%    Intake/Output Summary (Last 24 hours) at 09/28/2019 0827 Last data filed at 09/28/2019 6962 Gross per 24 hour  Intake 1348.9 ml  Output 1850 ml  Net -501.1 ml   Last Weight  Most recent update: 09/28/2019   4:34 AM   Weight  74.2 kg (163 lb 9.3 oz)           Gen:  Elderly M, (+) somnolence  HEENT: dry mucous membranes CV: Regular rate and rhythm, no murmurs rubs or gallops PULM: On 3LPm [place] ABD: soft/nontender/nondistended  Neuro: Lethargic  SUMMARY OF RECOMMENDATIONS DNAR/DNI  Radiology to place NGT  Trial of NGT feeding for 72 hours to see if patient improves if not ongoing conversations r/t hospice care  Ongoing PMT support and [place] conversations  Symptoms: Generalized Pain:  - Tylenol IV right now though will transition to elixir once NGT in place  Failure to Thrive:  - Trial of NGT feeding  Generalized Weakness:  - PT/OT  Time Spent: 25 Greater than 50% of the time was spent in counseling and coordination of care ______________________________________________________________________________________ [place] Team Team Cell Phone: [phone] Please utilize secure chat with additional questions, if there is no response within 30 minutes please call the above phone number  Palliative Medicine Team providers are available by phone from 7am to 7pm daily and can be reached through the team cell phone.  Should this patient require assistance outside of these hours, please call the patient's attending physician.

## 2019-09-28 NOTE — Social Work (Signed)
CSW attempted to reach daughter [name] and had to leave a message.  TOC team will continue to assist with discharge planning needs.

## 2019-09-28 NOTE — Progress Notes (Signed)
CRITICAL VALUE ALERT  Critical Value:  Potassium 2.6  Date & Time Notied:  09/28/2019 05:50  Provider Notified: Kennon Holter   Orders Received/Actions taken:

## 2019-09-28 NOTE — Progress Notes (Signed)
PROGRESS NOTE    [name]  AUQ:[id number] DOB: Aug 06, 1940 DOA: 09/23/2019 PCP: Nicolette Bang, DO   Brief Narrative: 79 year old with past medical history significant for paroxysmal A. fib on low-dose aspirin but not on anticoagulation due to chronic anemia history of GI bleed, CAD, COPD, CVA, diabetes type 2, chronic diastolic heart failure, GERD history of gastric ulcer, urinary retention with chronic indwelling Foley catheter who presents from rehab for evaluation of generalized weakness and anemia.  Labs drawn at the facility show worsening anemia hemoglobin of 6.2.  Patient was very somnolent and difficult to arouse on admission.  He was able to say his name, say a few words.  Patient admitted with acute metabolic and cephalopathy, anemia, metabolic acidosis, increased ammonia level. He continued to be more lethargic. He was evaluated by CCM no need for intubation, medical management. Neurology consulted as well, AMS is thought to be related to infection, elevated ammonia level, electrolytes abnormalities.     Assessment & Plan:   Principal Problem:   Symptomatic anemia Active Problems:   Diabetes mellitus, type II (HCC)   Diastolic CHF, chronic (HCC)   GI bleed   Hypokalemia   Acute encephalopathy   Palliative care by specialist   Goals of care, counseling/discussion   DNR (do not resuscitate)   1-Symptomatic acute blood loss anemia presumed to occult GI bleed: 70 hemoglobin of 6.  At baseline 8-9 Occult blood positive. Received 1 unit of packed red blood cell 6-30. And second unit 7/01 CT with presumed fecal impaction.  Tapwater enema ordered GI has been consulted. Hb stable 8---7.9--7.5 no evidence of active GI bleed, continue to monitor hb. Transfusion for hb less than 7. Evaluated by GI, not a candidate for endoscopy.  Continue with IV Protonix Monitor hb.   2-Acute metabolic encephalopathy: Patient presented with altered mental status.  Keep eyes  closed, say a few words. ABG pH with respiratory alkalosis.  Metabolic acidosis on the vent Support care Protecting airway Ammonia level elevated at 88 today.  Continue with lactulose enema BID.  EEG negative for seizure. Unable to do MRI due to pacemaker.  Appreciate neurology evaluation.  Continue to be lethargic. He said few words today.   3-Sepsis secondary to  CAUTI; patient has chronic Foley catheter due to urinary retention  Patient febrile hypotensive. Continue with fluids.  IV antibiotics. Plan to change cefepime to Zosyn (7/02) to cover: Citrobacter and enterococcus Faecalis.  With more than 50 white blood cell.  Continue with Zosyn day 3   4-Transaminases: Right upper quadrant ultrasound; Status post cholecystectomy. No biliary dilatation. Unremarkable sonographic appearance of the liver. Might be related to sepsis.   Nutrition;  Plan for feeding tube trial for 72 hours.  Start Tube feeding today.   Hyperammonemia; might be related to bacteria overgrowth. Liver US normal appearance of liver.  Continue with lactulose.  Trending down. 65---49  Paroxysmal A. fib holding aspirin due to concern for GI bleed.  Acute on CKD stage III b; Prior Cr 1.4--1.5  Presents with cr at 1.8. Improved with IV fluids.  Improving. Cr down to 1. 6  Diabetes: Continue with sliding scale insulin. Chronic diastolic heart failure: Chest x-ray with progressive central vascular congestion.  Monitor for pulmonary edema on IVF. B/L ronchus; started  nebulizer Thrombocytopenia; might be related to acute illness. Stable.  Hypernatremia; change fluids to D 5. Improved.  Hypokalemia; Replete IV. Total 8 runs.  Metabolic acidosis; related to infection, AKI, improved with Bicarb gtt.  Estimated body mass index is 26.4 kg/m as calculated from the following:   Height as of 09/22/19: 5\' 6"  (1.676 m).   Weight as of this encounter: 74.2 kg.   DVT prophylaxis: SCDs no anticoagulation due to GI  bleed Code Status: Full code Family Communication: Care discussed with daughter who was at bedside. 7/04 Disposition Plan:  Status is: Inpatient  Remains inpatient appropriate because:Hemodynamically unstable   Dispo: The patient is from: SNF              Anticipated d/c is to: SNF              Anticipated d/c date is: 3 days              Patient currently is not medically stable to d/c.  Still lethargic encephalopathic requiring iv fluids,  IV antibiotics.        Consultants:   CCM  Neurology  Procedures:   EEG  Antimicrobials:  Cefepime  Subjective: Lethargic, would say no to pain. Repeated two sentences. Said daughter name.   Objective: Vitals:   09/28/19 0400 09/28/19 0434 09/28/19 0810 09/28/19 1100  BP: (!) 101/54  117/70 115/62  Pulse: 70  82 78  Resp: 20  18 15   Temp: 99 F (37.2 C)  98.5 F (36.9 C)   TempSrc: Axillary  Oral   SpO2: 93%  94% 92%  Weight:  74.2 kg      Intake/Output Summary (Last 24 hours) at 09/28/2019 1326 Last data filed at 09/28/2019 0314 Gross per 24 hour  Intake 1348.9 ml  Output 1200 ml  Net 148.9 ml   Filed Weights   09/24/19 2042 09/28/19 0434  Weight: 70.3 kg 74.2 kg    Examination:  General exam: Lethargic.  Respiratory system: B.L  ronchus Cardiovascular system: S 1, S  2 RRR Gastrointestinal system: BS present, soft, nt Central nervous system: lethargic, said few words Extremities: Trace edema   Data Reviewed: I have personally reviewed following labs and imaging studies  CBC: Recent Labs  Lab 09/23/19 2054 09/23/19 2054 09/24/19 0559 09/24/19 1020 09/25/19 0927 09/25/19 2038 09/26/19 1004 09/27/19 0322 09/28/19 0513  WBC 4.8   < > 4.9  --  7.6  --  4.2 3.4* 3.7*  NEUTROABS 2.6  --   --   --   --   --   --   --   --   HGB 6.2*   < > 7.4*   < > 6.6* 8.0* 8.1* 7.9* 7.5*  HCT 20.5*   < > 24.5*   < > 22.4* 26.3* 26.5* 25.8* 24.7*  MCV 92.3   < > 91.4  --  93.3  --  92.7 92.8 93.9  PLT 111*   < >  123*  --  84*  --  77* 75* 74*   < > = values in this interval not displayed.   Basic Metabolic Panel: Recent Labs  Lab 09/24/19 0559 09/24/19 0559 09/24/19 1020 09/25/19 0927 09/26/19 0807 09/27/19 0322 09/28/19 0513  NA 136   < > 141 139 141 146* 141  K 3.3*   < > 2.9* 3.2* 4.1 2.9* 2.6*  CL 106  --   --  113* 116* 117* 113*  CO2 16*  --   --  18* 15* 20* 20*  GLUCOSE 104*  --   --  117* 108* 125* 110*  BUN 16  --   --  24* 28* 24* 18  CREATININE 1.71*  --   --  1.84* 1.62* 1.61* 1.62*  CALCIUM 9.8  --   --  9.5 10.1 10.2 9.6  MG 1.8  --   --   --   --   --   --    < > = values in this interval not displayed.   GFR: Estimated Creatinine Clearance: 32.8 mL/min (A) (by C-G formula based on SCr of 1.62 mg/dL (H)). Liver Function Tests: Recent Labs  Lab 09/23/19 2054 09/24/19 0559 09/25/19 0927 09/27/19 0322  AST 82* 98* 87* 98*  ALT 50* 48* 49* 54*  ALKPHOS 146* 161* 119 144*  BILITOT 0.7 1.4* 1.2 1.4*  PROT 9.1* 8.8* 8.3* 8.1  ALBUMIN 1.8* 1.8* 1.6* 1.4*   No results for input(s): LIPASE, AMYLASE in the last 168 hours. Recent Labs  Lab 09/24/19 1823 09/25/19 0927 09/26/19 0807 09/27/19 0322 09/28/19 0513  AMMONIA 66* 86* 68* 61* 49*   Coagulation Profile: No results for input(s): INR, PROTIME in the last 168 hours. Cardiac Enzymes: No results for input(s): CKTOTAL, CKMB, CKMBINDEX, TROPONINI in the last 168 hours. BNP (last 3 results) No results for input(s): PROBNP in the last 8760 hours. HbA1C: No results for input(s): HGBA1C in the last 72 hours. CBG: Recent Labs  Lab 09/27/19 1957 09/28/19 0023 09/28/19 0438 09/28/19 0808 09/28/19 1207  GLUCAP 133* 112* 116* 115* 132*   Lipid Profile: No results for input(s): CHOL, HDL, LDLCALC, TRIG, CHOLHDL, LDLDIRECT in the last 72 hours. Thyroid Function Tests: No results for input(s): TSH, T4TOTAL, FREET4, T3FREE, THYROIDAB in the last 72 hours. Anemia Panel: No results for input(s): VITAMINB12,  FOLATE, FERRITIN, TIBC, IRON, RETICCTPCT in the last 72 hours. Sepsis Labs: Recent Labs  Lab 09/25/19 0927 09/26/19 0253 09/27/19 0322  PROCALCITON 1.63 1.31 0.92  LATICACIDVEN 1.8  --   --     Recent Results (from the past 240 hour(s))  Urine Culture     Status: Abnormal   Collection Time: 09/23/19 11:48 PM   Specimen: Urine, Random  Result Value Ref Range Status   Specimen Description URINE, RANDOM  Final   Special Requests   Final    NONE Performed at [facility], [place] [address], [place], [place] [address]    Culture (A)  Final    >=100,000 COLONIES/mL CITROBACTER FREUNDII >=100,000 COLONIES/mL ENTEROCOCCUS FAECALIS    Report Status 09/28/2019 FINAL  Final   Organism ID, Bacteria CITROBACTER FREUNDII (A)  Final   Organism ID, Bacteria ENTEROCOCCUS FAECALIS (A)  Final      Susceptibility   Citrobacter freundii - MIC*    CEFAZOLIN >=64 RESISTANT Resistant     CEFTRIAXONE <=0.25 SENSITIVE Sensitive     CIPROFLOXACIN <=0.25 SENSITIVE Sensitive     GENTAMICIN <=1 SENSITIVE Sensitive     IMIPENEM <=0.25 SENSITIVE Sensitive     NITROFURANTOIN <=16 SENSITIVE Sensitive     TRIMETH/SULFA <=20 SENSITIVE Sensitive     PIP/TAZO <=4 SENSITIVE Sensitive     * >=100,000 COLONIES/mL CITROBACTER FREUNDII   Enterococcus faecalis - MIC*    AMPICILLIN <=2 SENSITIVE Sensitive     NITROFURANTOIN <=16 SENSITIVE Sensitive     VANCOMYCIN 1 SENSITIVE Sensitive     * >=100,000 COLONIES/mL ENTEROCOCCUS FAECALIS  SARS Coronavirus 2 by RT PCR (hospital order, performed in [place] hospital lab) Nasopharyngeal Nasopharyngeal Swab     Status: None   Collection Time: 09/24/19 12:56 AM   Specimen: Nasopharyngeal Swab  Result Value Ref Range Status   SARS Coronavirus 2 NEGATIVE NEGATIVE Final  Comment: (NOTE) SARS-CoV-2 target nucleic acids are NOT DETECTED.  The SARS-CoV-2 RNA is generally detectable in upper and lower respiratory specimens during the acute phase of infection. The  lowest concentration of SARS-CoV-2 viral copies this assay can detect is 250 copies / mL. A negative result does not preclude SARS-CoV-2 infection and should not be used as the sole basis for treatment or other patient management decisions.  A negative result may occur with improper specimen collection / handling, submission of specimen other than nasopharyngeal swab, presence of viral mutation(s) within the areas targeted by this assay, and inadequate number of viral copies (<250 copies / mL). A negative result must be combined with clinical observations, patient history, and epidemiological information.  Fact Sheet for Patients:   StrictlyIdeas.no  Fact Sheet for Healthcare Providers: BankingDealers.co.za  This test is not yet approved or  cleared by the [place] FDA and has been authorized for detection and/or diagnosis of SARS-CoV-2 by FDA under an Emergency Use Authorization (EUA).  This EUA will remain in effect (meaning this test can be used) for the duration of the COVID-19 declaration under Section 564(b)(1) of the Act, 21 U.S.C. section 360bbb-3(b)(1), unless the authorization is terminated or revoked sooner.  Performed at [facility], [place] [address], [place], [place] [address]   Culture, blood (routine x 2)     Status: None (Preliminary result)   Collection Time: 09/24/19  7:12 AM   Specimen: BLOOD LEFT FOREARM  Result Value Ref Range Status   Specimen Description BLOOD LEFT FOREARM  Final   Special Requests   Final    BOTTLES DRAWN AEROBIC ONLY Blood Culture adequate volume   Culture   Final    NO GROWTH 4 DAYS Performed at [facility], [place] [address] [place], [place] [address]    Report Status PENDING  Incomplete  Culture, blood (routine x 2)     Status: None (Preliminary result)   Collection Time: 09/24/19  8:22 AM   Specimen: BLOOD  Result Value Ref Range Status   Specimen Description BLOOD  SITE NOT SPECIFIED  Final   Special Requests   Final    BOTTLES DRAWN AEROBIC AND ANAEROBIC Blood Culture results may not be optimal due to an excessive volume of blood received in culture bottles   Culture   Final    NO GROWTH 4 DAYS Performed at [facility], [place] [address] [place], [place] [address]    Report Status PENDING  Incomplete         Radiology Studies: DG Loyce Dys Tube Plc W/Fl W/Rad  Result Date: 09/28/2019 CLINICAL DATA:  79 year old male in need of enteral access for feeding. EXAM: NASO G TUBE PLACEMENT WITH FL AND WITH RAD CONTRAST:  15mL OMNIPAQUE IOHEXOL 300 MG/ML  SOLN FLUOROSCOPY TIME:  Fluoroscopy Time:  1 minutes and 18 seconds Radiation Exposure Index (if provided by the fluoroscopic device): 10.8 mGy COMPARISON:  None. FINDINGS: Feeding tube placed under fluoroscopic guidance, with tip extending into the jejunum via the patient's gastrojejunostomy. Injection of the tube confirmed proper placement of the tip within the small bowel. IMPRESSION: 1. Fluoroscopic guided placement of feeding tube into the jejunum, as above. Electronically Signed   By: Vinnie Langton M.D.   On: 09/28/2019 11:11        Scheduled Meds:  Chlorhexidine Gluconate Cloth  6 each Topical Daily   ferrous sulfate  15 mg of iron Oral BID   insulin aspart  0-9 Units  Subcutaneous Q4H   lactulose  300 mL Rectal BID   Or   lactulose  30 g Per Tube BID   lidocaine  15 mL Mouth/Throat Once   lidocaine       pantoprazole (PROTONIX) IV  40 mg Intravenous Q12H   tamsulosin  0.4 mg Oral Daily   thiamine injection  100 mg Intravenous Daily   Continuous Infusions:  dextrose 75 mL/hr at 09/28/19 0123   diltiazem (CARDIZEM) infusion Stopped (09/24/19 1848)   feeding supplement (OSMOLITE 1.2 CAL)     magnesium sulfate bolus IVPB     piperacillin-tazobactam (ZOSYN)  IV 3.375 g (09/28/19 0932)   potassium chloride       LOS: 4 days    Time spent: 35 minutes.     Elmarie Shiley, MD [facility]   If 7PM-7AM, please contact night-coverage [email or web address]  09/28/2019, 1:26 PM

## 2019-09-28 NOTE — Progress Notes (Signed)
RN notified by CCMD that patient had a 9 beat run of PVC.  RN notified provider on call

## 2019-09-29 DIAGNOSIS — E440 Moderate protein-calorie malnutrition: Secondary | ICD-10-CM | POA: Insufficient documentation

## 2019-09-29 LAB — CBC
HCT: 27.1 % — ABNORMAL LOW (ref 39.0–52.0)
Hemoglobin: 8.2 g/dL — ABNORMAL LOW (ref 13.0–17.0)
MCH: 28.5 pg (ref 26.0–34.0)
MCHC: 30.3 g/dL (ref 30.0–36.0)
MCV: 94.1 fL (ref 80.0–100.0)
Platelets: 55 10*3/uL — ABNORMAL LOW (ref 150–400)
RBC: 2.88 MIL/uL — ABNORMAL LOW (ref 4.22–5.81)
RDW: 23.3 % — ABNORMAL HIGH (ref 11.5–15.5)
WBC: 4.2 10*3/uL (ref 4.0–10.5)
nRBC: 0.5 % — ABNORMAL HIGH (ref 0.0–0.2)

## 2019-09-29 LAB — BASIC METABOLIC PANEL
Anion gap: 7 (ref 5–15)
BUN: 11 mg/dL (ref 8–23)
CO2: 21 mmol/L — ABNORMAL LOW (ref 22–32)
Calcium: 8.7 mg/dL — ABNORMAL LOW (ref 8.9–10.3)
Chloride: 108 mmol/L (ref 98–111)
Creatinine, Ser: 1.39 mg/dL — ABNORMAL HIGH (ref 0.61–1.24)
GFR calc Af Amer: 55 mL/min — ABNORMAL LOW (ref >60)
GFR calc non Af Amer: 48 mL/min — ABNORMAL LOW (ref >60)
Glucose, Bld: 148 mg/dL — ABNORMAL HIGH (ref 70–99)
Potassium: 3.2 mmol/L — ABNORMAL LOW (ref 3.5–5.1)
Sodium: 136 mmol/L (ref 135–145)

## 2019-09-29 LAB — GLUCOSE, CAPILLARY
Glucose-Capillary: 145 mg/dL — ABNORMAL HIGH (ref 70–99)
Glucose-Capillary: 152 mg/dL — ABNORMAL HIGH (ref 70–99)
Glucose-Capillary: 168 mg/dL — ABNORMAL HIGH (ref 70–99)
Glucose-Capillary: 174 mg/dL — ABNORMAL HIGH (ref 70–99)
Glucose-Capillary: 175 mg/dL — ABNORMAL HIGH (ref 70–99)
Glucose-Capillary: 178 mg/dL — ABNORMAL HIGH (ref 70–99)
Glucose-Capillary: 192 mg/dL — ABNORMAL HIGH (ref 70–99)

## 2019-09-29 LAB — CULTURE, BLOOD (ROUTINE X 2)
Culture: NO GROWTH
Culture: NO GROWTH
Special Requests: ADEQUATE

## 2019-09-29 LAB — MAGNESIUM: Magnesium: 2.1 mg/dL (ref 1.7–2.4)

## 2019-09-29 LAB — AMMONIA: Ammonia: 31 umol/L (ref 9–35)

## 2019-09-29 MED ORDER — RIFAXIMIN 550 MG PO TABS
550.0000 mg | ORAL_TABLET | Freq: Two times a day (BID) | ORAL | Status: DC
  Administered 2019-09-29 – 2019-10-05 (×13): 550 mg
  Filled 2019-09-29 (×13): qty 1

## 2019-09-29 MED ORDER — OSMOLITE 1.5 CAL PO LIQD
1000.0000 mL | ORAL | Status: DC
  Administered 2019-09-29 – 2019-10-05 (×7): 1000 mL
  Filled 2019-09-29 (×10): qty 1000

## 2019-09-29 MED ORDER — POTASSIUM CHLORIDE 10 MEQ/100ML IV SOLN
10.0000 meq | INTRAVENOUS | Status: AC
  Administered 2019-09-29 (×4): 10 meq via INTRAVENOUS
  Filled 2019-09-29 (×4): qty 100

## 2019-09-29 MED ORDER — WHITE PETROLATUM EX OINT
TOPICAL_OINTMENT | CUTANEOUS | Status: DC | PRN
  Filled 2019-09-29: qty 28.35

## 2019-09-29 MED ORDER — PRO-STAT SUGAR FREE PO LIQD
30.0000 mL | Freq: Two times a day (BID) | ORAL | Status: DC
  Administered 2019-09-29 – 2019-10-05 (×13): 30 mL
  Filled 2019-09-29 (×13): qty 30

## 2019-09-29 MED ORDER — RIFAXIMIN 550 MG PO TABS: 550.0000 mg | ORAL_TABLET | Freq: Two times a day (BID) | ORAL | Status: DC

## 2019-09-29 NOTE — TOC Initial Note (Signed)
Transition of Care [facility]) - Initial/Assessment Note    Patient Details  Name: [name] MRN: [id number] Date of Birth: [birth year]  [facility]) CM/SW Contact:    Vinie Sill, [place] Phone Number: 09/29/2019, 2:54 PM  Clinical Narrative:                  Patient's son, Kunaal returned call. CSW introduced self and explained role. CSW advised unable to reach [name] via phone. Patient's son verified patient's daughter, [name] phone number is correct but explains she works. Patient's son states family is agreeable to the patient returning to [facility]. However, they are having conversation about how they can move the patient closer to family. Patient's son states he lives 6 hrs away in [place]. He explained another one of the patient's daughter lives on the [place]. He states they will do what is best for the patient and will talk with the MD regarding if patient can travel by car,etc but in the meantime he is agreeable for the patient to return to [facility]. He wants to be able to visit and call to discuss patient's care. CSW encourage the family to contact [facility] regarding their visitation policies. Family states no further questions or concerns at this time.  Tried calling patient's daughter, 12:07pm and 2:51 pm. No answer  Thurmond Butts, MSW, [place] Clinical Social Worker   Expected Discharge Plan: Skilled Nursing Facility Barriers to Discharge: Continued Medical Work up   Patient Goals and CMS Choice Patient states their goals for this hospitalization and ongoing recovery are:: per family" to move closer to family"      Expected Discharge Plan and Services Expected Discharge Plan: [place] In-house Referral: Clinical Social Work     Living arrangements for the past 2 months: [place]                                      Prior Living Arrangements/Services Living arrangements for the past 2 months:  [place] Lives with:: Facility Resident Patient language and need for interpreter reviewed:: No        Need for Family Participation in Patient Care: Yes (Comment) Care giver support system in place?: Yes (comment)   Criminal Activity/Legal Involvement Pertinent to Current Situation/Hospitalization: No - Comment as needed  Activities of Daily Living      Permission Sought/Granted Permission sought to share information with : Family Supports Permission granted to share information with : Yes, Verbal Permission Granted  Share Information with NAME: Dimas Millin  Permission granted to share info w AGENCY: SNFs  Permission granted to share info w Relationship: daughter     Emotional Assessment       Orientation: : Oriented to Self Alcohol / Substance Use: Not Applicable Psych Involvement: No (comment)  Admission diagnosis:  GI bleed [K92.2] Elevated LFTs [R79.89] Symptomatic anemia [D64.9] Gastrointestinal hemorrhage, unspecified gastrointestinal hemorrhage type [K92.2] Patient Active Problem List   Diagnosis Date Noted  . Malnutrition of moderate degree 09/29/2019  . Palliative care by specialist   . Goals of care, counseling/discussion   . DNR (do not resuscitate)   . Acute encephalopathy   . GI bleed 09/24/2019  . Hypokalemia 09/24/2019  . HCAP (healthcare-associated pneumonia) 08/03/2019  . Cardiac pacemaker in situ 10/29/2018  . Major depressive disorder, recurrent severe without psychotic features ([place]) 04/15/2018  . Atrial fibrillation with RVR (  [place]) 11/08/2017  . HTN (hypertension) 05/21/2017  . Diabetes mellitus, type II ([place]) 05/21/2017  . Diastolic CHF, chronic ([place]) 05/21/2017  . COPD (chronic obstructive pulmonary disease) ([place]) 05/21/2017  . H/O atrial flutter 05/21/2017  . Heme positive stool   . Symptomatic anemia 07/10/2016  . Cardiac syncope 05/15/2015  . Cervical disc disease 05/15/2015  . Anemia 05/15/2015  . History of CVA  (cerebrovascular accident) 04/03/2014  . Benign neoplasm of rectum and anal canal 12/02/2013  . Tobacco use disorder 11/30/2013  . Mixed hyperlipidemia 06/06/2013  . Type 2 diabetes mellitus ([place]) 01/22/2013  . Obesity (BMI 30-39.9) 12/14/2012  . Persistent atrial fibrillation ([place]) 10/07/2010  . Atrial flutter - status post CTI ablation 07/29/2010  . Essential hypertension 07/29/2010  . Chronic diastolic congestive heart failure ([place]) 07/29/2010  . Atherosclerotic heart disease of native coronary artery with other forms of angina pectoris ([place]) 07/29/2010   PCP:  Nicolette Bang, DO Pharmacy:   [id number] - [place], [place] [address] [place] [address] Phone: [phone] Fax: [phone]     Social Determinants of Health (SDOH) Interventions    Readmission Risk Interventions Readmission Risk Prevention Plan 08/05/2019  Transportation Screening Complete  PCP or Specialist Appt within 3-5 Days Complete  HRI or [place] Complete  Social Work Consult for [employer] Planning/Counseling Complete  Palliative Care Screening Not Applicable  Medication Review Press photographer) Complete  Some recent data might be hidden

## 2019-09-29 NOTE — Progress Notes (Addendum)
  Speech Language Pathology Treatment: Dysphagia  Patient Details Name: [name] MRN: [id number] DOB: 1940-09-01 Today's Date: 09/29/2019 Time: 2633-3545 SLP Time Calculation (min) (ACUTE ONLY): 9 min  Assessment / Plan / Recommendation Clinical Impression  Pt was seen for dysphagia treatment. He was more alert than when he was last seen by this SLP and responded to some of the SLP's questions. Pt verbalized desire to having something to eat and accepted limited p.o. trials of puree solids. Impaired bolus formation and/or an oropharyngeal delay is suspected considering time of bolus presentation compared to that of hyolaryngeal movement. He demonstrated coughing with 1/2 tsp boluses of puree solids, suggesting aspiration and refused all additional boluses. NGT was placed on 7/3 and, per palliative care's note, family is waiting 72 hours to assess whether pt is making any improvements prior to discussing the option of hospice. SLP will continue to follow pt.    HPI HPI: Pt is an 79 y.o. male with medical history significant of paroxysmal A. fib on low-dose aspirin but not anticoagulation due to chronic anemia and history of GI bleed, CAD, COPD, CVA, non-insulin-dependent type 2 diabetes, chronic diastolic CHF, hypertension, hyperlipidemia, GERD, history of gastric ulcer, history of urinary retention with a chronic indwelling Foley catheter who presented to the ED from his rehab facility for evaluation of generalized weakness and anemia. CT head 6/30 negative for acute changes. CXR 6/30: Progressive central vascular congestion without overt edema. EEG: moderate diffuse slowing.  Findings are suggestive of an underlying encephalopathy possibly related to toxic, metabolic conditions, versus diffuse structural brain abnormalities.      SLP Plan  Continue with current plan of care       Recommendations  Diet recommendations: NPO Medication Administration: Via alternative means                 Oral Care Recommendations: Oral care QID Follow up Recommendations: Other (comment) (TBD) SLP Visit Diagnosis: Dysphagia, oropharyngeal phase (R13.12) Plan: Continue with current plan of care       Jaimeson Gopal I. Hardin Negus, [place], [place] Office number [phone] Pager [place] 09/29/2019, 5:51 PM

## 2019-09-29 NOTE — Progress Notes (Signed)
Initial Nutrition Assessment  DOCUMENTATION CODES:   Non-severe (moderate) malnutrition in context of chronic illness  INTERVENTION:   Tube feeding:  -Osmolite 1.5 @ 50 ml/hr via small bore feeding tube (1200 ml) -30 ml Prostat BID  Provides: 2000 kcals, 105 grams protein, 914 ml free water.   NUTRITION DIAGNOSIS:   Moderate Malnutrition related to chronic illness (recurrent GI bleed, CHF) as evidenced by mild fat depletion, moderate muscle depletion.  GOAL:   Patient will meet greater than or equal to 90% of their needs  MONITOR:   Diet advancement, TF tolerance, Weight trends, Labs, I & O's  REASON FOR ASSESSMENT:   Consult Enteral/tube feeding initiation and management  ASSESSMENT:   Patient with PMH significant for CAD, COPD, CVA, DM, CHF, HTN, HLD, GERD, and gastric ulcer. Presents this admission with ABLA 2/2 to suspected GI bleed.   Per GI pt has longstanding history of oozing/slow blood loss from gastrojejunostomy anastomosis. Poor candidate for surgery.   Pt unable to provide history due to confusion. No family at bedside. Small bore placed yesterday and TF protocol was entered. Plan to trial tube feeding for 72 hours and if no improvement then PMT to discuss possible hospice. SLP unable to see pt given mental status. Will change tube feeding formula to better meet needs. Will attempt to obtain history if possible.   Unsure of pt's UBW. Records indicate pt weighed 79.2 kg on 5/11 and 70.3 kg this admission. Unable to determine dry wt loss vs fluid fluctuation given CHF history.   Drips: D5 @ 75 ml/hr  Medications: SS novolog, thimaine Labs: K 3.2 (L) CBG 140-178  NUTRITION - FOCUSED PHYSICAL EXAM:    Most Recent Value  Orbital Region Mild depletion  Upper Arm Region Mild depletion  Thoracic and Lumbar Region Unable to assess  Buccal Region No depletion  Temple Region Severe depletion  Clavicle Bone Region Moderate depletion  Clavicle and Acromion Bone  Region Severe depletion  Scapular Bone Region Unable to assess  Dorsal Hand Mild depletion  Patellar Region Moderate depletion  Anterior Thigh Region Moderate depletion  Posterior Calf Region Moderate depletion  Edema (RD Assessment) None  Hair Reviewed  Eyes Unable to assess  Mouth Unable to assess  Skin Reviewed  Nails Reviewed     Diet Order:   Diet Order            Diet NPO time specified Except for: Sips with Meds  Diet effective now                 EDUCATION NEEDS:   Not appropriate for education at this time  Skin:  Skin Assessment: Reviewed RN Assessment  Last BM:  7/5  Height:   Ht Readings from Last 1 Encounters:  09/22/19 5\' 6"  (1.676 m)    Weight:   Wt Readings from Last 1 Encounters:  09/28/19 74.2 kg    BMI:  Body mass index is 26.4 kg/m.  Estimated Nutritional Needs:   Kcal:  2000-2200 kcal  Protein:  100-120 grams  Fluid:  >/= 2 L/day   Mariana Single RD, LDN Clinical Nutrition Pager listed in [place]

## 2019-09-29 NOTE — Progress Notes (Signed)
PROGRESS NOTE    [name]  NTZ:[id number] DOB: 25-Sep-1940 DOA: 09/23/2019 PCP: Nicolette Bang, DO   Brief Narrative: 79 year old with past medical history significant for paroxysmal A. fib on low-dose aspirin but not on anticoagulation due to chronic anemia history of GI bleed, CAD, COPD, CVA, diabetes type 2, chronic diastolic heart failure, GERD history of gastric ulcer, urinary retention with chronic indwelling Foley catheter who presents from rehab for evaluation of generalized weakness and anemia.  Labs drawn at the facility show worsening anemia hemoglobin of 6.2.  Patient was very somnolent and difficult to arouse on admission.  He was able to say his name, say a few words.  Patient admitted with acute metabolic and cephalopathy, anemia, metabolic acidosis, increased ammonia level. He continued to be more lethargic. He was evaluated by CCM no need for intubation, medical management. Neurology consulted as well, AMS is thought to be related to infection, elevated ammonia level, electrolytes abnormalities.     Assessment & Plan:   Principal Problem:   Symptomatic anemia Active Problems:   Diabetes mellitus, type II (HCC)   Diastolic CHF, chronic (HCC)   GI bleed   Hypokalemia   Acute encephalopathy   Palliative care by specialist   Goals of care, counseling/discussion   DNR (do not resuscitate)   Malnutrition of moderate degree   1-Symptomatic acute blood loss anemia presumed to occult GI bleed: 70 hemoglobin of 6.  At baseline 8-9 Occult blood positive. Received 1 unit of packed red blood cell 6-30. And second unit 7/01 CT with presumed fecal impaction.  Tapwater enema ordered GI has been consulted. Hb stable 8---7.9--7.5 no evidence of active GI bleed, continue to monitor hb. Transfusion for hb less than 7. Evaluated by GI, not a candidate for endoscopy.  Continue with IV Protonix Monitor hb. Today at 8   2-Acute metabolic and hepatic   encephalopathy: Patient presented with altered mental status.  Keep eyes closed, say a few words. ABG pH with respiratory alkalosis.  Metabolic acidosis Support care Protecting airway Ammonia level elevated at 88 --68--60--48--31 EEG negative for seizure. Unable to do MRI due to pacemaker.  Appreciate neurology evaluation.  Continue to keep sleepy, say few words.  Hold lactulose due to diarrhea. Plan to start rifaximin.  3-Sepsis secondary to  CAUTI; patient has chronic Foley catheter due to urinary retention  Patient febrile, hypotensive. Continue with fluids.  IV antibiotics. Plan to change cefepime to Zosyn (7/02) to cover: Citrobacter and enterococcus Faecalis.  With more than 50 white blood cell.  Continue with Zosyn day 4  4-Transaminases: Right upper quadrant ultrasound; Status post cholecystectomy. No biliary dilatation. Unremarkable sonographic appearance of the liver. Might be related to sepsis.   Nutrition;  Plan for feeding tube trial for 72 hours.  Started  Tube feeding 7/04  Hyperammonemia; might be related to bacteria overgrowth. Liver US normal appearance of liver.  Treated  with lactulose. Hold lactulose due to diarrhea. Start rifaximin.  Trending down. 65---49---31  Paroxysmal A. fib holding aspirin due to concern for GI bleed.  Acute on CKD stage III b; Prior Cr 1.4--1.5  Presents with cr at 1.8. Improved with IV fluids.  Improving. Cr down to 1. 6--1.3  Diabetes: Continue with sliding scale insulin. Chronic diastolic heart failure: Chest x-ray with progressive central vascular congestion.  Monitor for pulmonary edema on IVF. B/L ronchus; started  nebulizer Thrombocytopenia; might be related to acute illness. Stable.  Hypernatremia; change fluids to D 5. Improved.  Hypokalemia; Replete IV  Metabolic acidosis; related to infection, AKI, improved with Bicarb gtt.   Estimated body mass index is 26.4 kg/m as calculated from the following:   Height as of  09/22/19: 5\' 6"  (1.676 m).   Weight as of this encounter: 74.2 kg.   DVT prophylaxis: SCDs no anticoagulation due to GI bleed Code Status: Full code Family Communication: will update family later  Disposition Plan:  Status is: Inpatient  Remains inpatient appropriate because:Hemodynamically unstable   Dispo: The patient is from: SNF              Anticipated d/c is to: SNF              Anticipated d/c date is: 3 days              Patient currently is not medically stable to d/c.  Still lethargic encephalopathic requiring iv fluids,  IV antibiotics.        Consultants:   CCM  Neurology  Procedures:   EEG  Antimicrobials:  Cefepime  Subjective: Keep eyes close, says a few words.    Objective: Vitals:   09/29/19 0437 09/29/19 0800 09/29/19 1000 09/29/19 1100  BP: 116/61 122/84 111/68 112/67  Pulse: 72 68 67 67  Resp: 14 15 15 14   Temp: (!) 97.3 F (36.3 C) (!) 97.5 F (36.4 C)  98 F (36.7 C)  TempSrc: Axillary Oral  Oral  SpO2: 99% 100% 99% 97%  Weight:        Intake/Output Summary (Last 24 hours) at 09/29/2019 1411 Last data filed at 09/29/2019 1153 Gross per 24 hour  Intake 2324.79 ml  Output 2000 ml  Net 324.79 ml   Filed Weights   09/24/19 2042 09/28/19 0434  Weight: 70.3 kg 74.2 kg    Examination:  General exam: lethargic Respiratory system:less ronchus Cardiovascular system: S 1 , S 2 RRR Gastrointestinal system: BS present, soft, nt Central nervous system: lethargic Extremities: trace edema   Data Reviewed: I have personally reviewed following labs and imaging studies  CBC: Recent Labs  Lab 09/23/19 2054 09/24/19 0559 09/25/19 0927 09/25/19 0927 09/25/19 2038 09/26/19 1004 09/27/19 0322 09/28/19 0513 09/29/19 0411  WBC 4.8   < > 7.6  --   --  4.2 3.4* 3.7* 4.2  NEUTROABS 2.6  --   --   --   --   --   --   --   --   HGB 6.2*   < > 6.6*   < > 8.0* 8.1* 7.9* 7.5* 8.2*  HCT 20.5*   < > 22.4*   < > 26.3* 26.5* 25.8* 24.7* 27.1*   MCV 92.3   < > 93.3  --   --  92.7 92.8 93.9 94.1  PLT 111*   < > 84*  --   --  77* 75* 74* 55*   < > = values in this interval not displayed.   Basic Metabolic Panel: Recent Labs  Lab 09/24/19 0559 09/24/19 1020 09/25/19 0927 09/26/19 0807 09/27/19 0322 09/28/19 0513 09/29/19 0411  NA 136   < > 139 141 146* 141 136  K 3.3*   < > 3.2* 4.1 2.9* 2.6* 3.2*  CL 106  --  113* 116* 117* 113* 108  CO2 16*  --  18* 15* 20* 20* 21*  GLUCOSE 104*  --  117* 108* 125* 110* 148*  BUN 16  --  24* 28* 24* 18 11  CREATININE 1.71*  --  1.84* 1.62* 1.61* 1.62* 1.39*  CALCIUM 9.8  --  9.5 10.1 10.2 9.6 8.7*  MG 1.8  --   --   --   --   --  2.1   < > = values in this interval not displayed.   GFR: Estimated Creatinine Clearance: 38.2 mL/min (A) (by C-G formula based on SCr of 1.39 mg/dL (H)). Liver Function Tests: Recent Labs  Lab 09/23/19 2054 09/24/19 0559 09/25/19 0927 09/27/19 0322  AST 82* 98* 87* 98*  ALT 50* 48* 49* 54*  ALKPHOS 146* 161* 119 144*  BILITOT 0.7 1.4* 1.2 1.4*  PROT 9.1* 8.8* 8.3* 8.1  ALBUMIN 1.8* 1.8* 1.6* 1.4*   No results for input(s): LIPASE, AMYLASE in the last 168 hours. Recent Labs  Lab 09/25/19 0927 09/26/19 0807 09/27/19 0322 09/28/19 0513 09/29/19 1027  AMMONIA 86* 68* 61* 49* 31   Coagulation Profile: No results for input(s): INR, PROTIME in the last 168 hours. Cardiac Enzymes: No results for input(s): CKTOTAL, CKMB, CKMBINDEX, TROPONINI in the last 168 hours. BNP (last 3 results) No results for input(s): PROBNP in the last 8760 hours. HbA1C: No results for input(s): HGBA1C in the last 72 hours. CBG: Recent Labs  Lab 09/28/19 2033 09/29/19 0026 09/29/19 0438 09/29/19 0811 09/29/19 1125  GLUCAP 140* 145* 152* 175* 178*   Lipid Profile: No results for input(s): CHOL, HDL, LDLCALC, TRIG, CHOLHDL, LDLDIRECT in the last 72 hours. Thyroid Function Tests: No results for input(s): TSH, T4TOTAL, FREET4, T3FREE, THYROIDAB in the last 72  hours. Anemia Panel: No results for input(s): VITAMINB12, FOLATE, FERRITIN, TIBC, IRON, RETICCTPCT in the last 72 hours. Sepsis Labs: Recent Labs  Lab 09/25/19 0927 09/26/19 0253 09/27/19 0322  PROCALCITON 1.63 1.31 0.92  LATICACIDVEN 1.8  --   --     Recent Results (from the past 240 hour(s))  Urine Culture     Status: Abnormal   Collection Time: 09/23/19 11:48 PM   Specimen: Urine, Random  Result Value Ref Range Status   Specimen Description URINE, RANDOM  Final   Special Requests   Final    NONE Performed at [facility], [place] [address] [place], [place] [address]    Culture (A)  Final    >=100,000 COLONIES/mL CITROBACTER FREUNDII >=100,000 COLONIES/mL ENTEROCOCCUS FAECALIS    Report Status 09/28/2019 FINAL  Final   Organism ID, Bacteria CITROBACTER FREUNDII (A)  Final   Organism ID, Bacteria ENTEROCOCCUS FAECALIS (A)  Final      Susceptibility   Citrobacter freundii - MIC*    CEFAZOLIN >=64 RESISTANT Resistant     CEFTRIAXONE <=0.25 SENSITIVE Sensitive     CIPROFLOXACIN <=0.25 SENSITIVE Sensitive     GENTAMICIN <=1 SENSITIVE Sensitive     IMIPENEM <=0.25 SENSITIVE Sensitive     NITROFURANTOIN <=16 SENSITIVE Sensitive     TRIMETH/SULFA <=20 SENSITIVE Sensitive     PIP/TAZO <=4 SENSITIVE Sensitive     * >=100,000 COLONIES/mL CITROBACTER FREUNDII   Enterococcus faecalis - MIC*    AMPICILLIN <=2 SENSITIVE Sensitive     NITROFURANTOIN <=16 SENSITIVE Sensitive     VANCOMYCIN 1 SENSITIVE Sensitive     * >=100,000 COLONIES/mL ENTEROCOCCUS FAECALIS  SARS Coronavirus 2 by RT PCR (hospital order, performed in [facility] hospital lab) Nasopharyngeal Nasopharyngeal Swab     Status: None   Collection Time: 09/24/19 12:56 AM   Specimen: Nasopharyngeal Swab  Result Value Ref Range Status   SARS Coronavirus 2 NEGATIVE NEGATIVE Final    Comment: (NOTE) SARS-CoV-2 target nucleic acids are NOT  DETECTED.  The SARS-CoV-2 RNA is generally detectable in upper and  lower respiratory specimens during the acute phase of infection. The lowest concentration of SARS-CoV-2 viral copies this assay can detect is 250 copies / mL. A negative result does not preclude SARS-CoV-2 infection and should not be used as the sole basis for treatment or other patient management decisions.  A negative result may occur with improper specimen collection / handling, submission of specimen other than nasopharyngeal swab, presence of viral mutation(s) within the areas targeted by this assay, and inadequate number of viral copies (<250 copies / mL). A negative result must be combined with clinical observations, patient history, and epidemiological information.  Fact Sheet for Patients:   StrictlyIdeas.no  Fact Sheet for Healthcare Providers: BankingDealers.co.za  This test is not yet approved or  cleared by the [place] FDA and has been authorized for detection and/or diagnosis of SARS-CoV-2 by FDA under an Emergency Use Authorization (EUA).  This EUA will remain in effect (meaning this test can be used) for the duration of the COVID-19 declaration under Section 564(b)(1) of the Act, 21 U.S.C. section 360bbb-3(b)(1), unless the authorization is terminated or revoked sooner.  Performed at [facility], [place] [address], [place], [place] [address]   Culture, blood (routine x 2)     Status: None   Collection Time: 09/24/19  7:12 AM   Specimen: BLOOD LEFT FOREARM  Result Value Ref Range Status   Specimen Description BLOOD LEFT FOREARM  Final   Special Requests   Final    BOTTLES DRAWN AEROBIC ONLY Blood Culture adequate volume   Culture   Final    NO GROWTH 5 DAYS Performed at [facility], [place] [address], [place], [place] [address]    Report Status 09/29/2019 FINAL  Final  Culture, blood (routine x 2)     Status: None   Collection Time: 09/24/19  8:22 AM   Specimen: BLOOD  Result Value Ref Range Status    Specimen Description BLOOD SITE NOT SPECIFIED  Final   Special Requests   Final    BOTTLES DRAWN AEROBIC AND ANAEROBIC Blood Culture results may not be optimal due to an excessive volume of blood received in culture bottles   Culture   Final    NO GROWTH 5 DAYS Performed at [facility], [place] [address] [place], [place] [address]    Report Status 09/29/2019 FINAL  Final         Radiology Studies: DG Loyce Dys Tube Plc W/Fl W/Rad  Result Date: 09/28/2019 CLINICAL DATA:  79 year old male in need of enteral access for feeding. EXAM: NASO G TUBE PLACEMENT WITH FL AND WITH RAD CONTRAST:  66mL OMNIPAQUE IOHEXOL 300 MG/ML  SOLN FLUOROSCOPY TIME:  Fluoroscopy Time:  1 minutes and 18 seconds Radiation Exposure Index (if provided by the fluoroscopic device): 10.8 mGy COMPARISON:  None. FINDINGS: Feeding tube placed under fluoroscopic guidance, with tip extending into the jejunum via the patient's gastrojejunostomy. Injection of the tube confirmed proper placement of the tip within the small bowel. IMPRESSION: 1. Fluoroscopic guided placement of feeding tube into the jejunum, as above. Electronically Signed   By: Vinnie Langton M.D.   On: 09/28/2019 11:11        Scheduled Meds:  Chlorhexidine Gluconate Cloth  6 each Topical Daily   feeding supplement (PRO-STAT SUGAR FREE 64)  30 mL Per Tube BID   ferrous sulfate  15 mg of iron Oral BID   insulin  aspart  0-9 Units Subcutaneous Q4H   lidocaine  15 mL Mouth/Throat Once   pantoprazole (PROTONIX) IV  40 mg Intravenous Q12H   rifaximin  550 mg Per Tube BID   tamsulosin  0.4 mg Oral Daily   thiamine injection  100 mg Intravenous Daily   Continuous Infusions:  dextrose 75 mL/hr at 09/29/19 0517   feeding supplement (OSMOLITE 1.5 CAL)     piperacillin-tazobactam (ZOSYN)  IV 3.375 g (09/29/19 0820)     LOS: 5 days    Time spent: 35 minutes.     Elmarie Shiley, MD [facility]   If 7PM-7AM, please contact  night-coverage [email or web address]  09/29/2019, 2:11 PM

## 2019-09-29 NOTE — Social Work (Signed)
  CSW called patient's daughter - unable to reach her. Called patient's son ,Hester, Forget, left voice message to return call.    Thurmond Butts, MSW, [place] Clinical Social Worker

## 2019-09-30 LAB — GLUCOSE, CAPILLARY
Glucose-Capillary: 208 mg/dL — ABNORMAL HIGH (ref 70–99)
Glucose-Capillary: 205 mg/dL — ABNORMAL HIGH (ref 70–99)
Glucose-Capillary: 203 mg/dL — ABNORMAL HIGH (ref 70–99)
Glucose-Capillary: 210 mg/dL — ABNORMAL HIGH (ref 70–99)
Glucose-Capillary: 183 mg/dL — ABNORMAL HIGH (ref 70–99)

## 2019-09-30 LAB — CBC
HCT: 25.5 % — ABNORMAL LOW (ref 39.0–52.0)
Hemoglobin: 7.7 g/dL — ABNORMAL LOW (ref 13.0–17.0)
MCH: 27.7 pg (ref 26.0–34.0)
MCHC: 30.2 g/dL (ref 30.0–36.0)
MCV: 91.7 fL (ref 80.0–100.0)
Platelets: 58 10*3/uL — ABNORMAL LOW (ref 150–400)
RBC: 2.78 MIL/uL — ABNORMAL LOW (ref 4.22–5.81)
RDW: 23 % — ABNORMAL HIGH (ref 11.5–15.5)
WBC: 5.1 10*3/uL (ref 4.0–10.5)
nRBC: 0.6 % — ABNORMAL HIGH (ref 0.0–0.2)

## 2019-09-30 LAB — BASIC METABOLIC PANEL
Anion gap: 8 (ref 5–15)
BUN: 9 mg/dL (ref 8–23)
CO2: 22 mmol/L (ref 22–32)
Calcium: 8.2 mg/dL — ABNORMAL LOW (ref 8.9–10.3)
Chloride: 103 mmol/L (ref 98–111)
Creatinine, Ser: 1.23 mg/dL (ref 0.61–1.24)
GFR calc Af Amer: >60 mL/min (ref >60)
GFR calc non Af Amer: 55 mL/min — ABNORMAL LOW (ref >60)
Glucose, Bld: 211 mg/dL — ABNORMAL HIGH (ref 70–99)
Potassium: 3.2 mmol/L — ABNORMAL LOW (ref 3.5–5.1)
Sodium: 133 mmol/L — ABNORMAL LOW (ref 135–145)

## 2019-09-30 LAB — AMMONIA: Ammonia: 53 umol/L — ABNORMAL HIGH (ref 9–35)

## 2019-09-30 MED ORDER — POTASSIUM CHLORIDE 10 MEQ/100ML IV SOLN
10.0000 meq | Freq: Once | INTRAVENOUS | Status: AC
  Administered 2019-09-30: 10 meq via INTRAVENOUS

## 2019-09-30 MED ORDER — SODIUM CHLORIDE 0.9 % IV SOLN: INTRAVENOUS | Status: DC

## 2019-09-30 MED ORDER — POTASSIUM CHLORIDE 10 MEQ/100ML IV SOLN
10.0000 meq | INTRAVENOUS | Status: AC
  Administered 2019-09-30 (×3): 10 meq via INTRAVENOUS
  Filled 2019-09-30 (×4): qty 100

## 2019-09-30 NOTE — Care Management Important Message (Signed)
Important Message  Patient Details  Name: [name] MRN: [id number] Date of Birth: 07/09/1940   Medicare Important Message Given:  Yes     [name] 09/30/2019, 3:28 PM

## 2019-09-30 NOTE — Progress Notes (Signed)
Inpatient Diabetes Program Recommendations  AACE/ADA: New Consensus Statement on Inpatient Glycemic Control  Target Ranges:  Prepandial:   less than 140 mg/dL      Peak postprandial:   less than 180 mg/dL (1-2 hours)      Critically ill patients:  140 - 180 mg/dL   Results for [name], Trevor "[name]" (MRN [id number]) as of 09/30/2019 09:51  Ref. Range 09/29/2019 08:11 09/29/2019 11:25 09/29/2019 16:11 09/29/2019 20:06 09/29/2019 23:46 09/30/2019 03:52 09/30/2019 08:26  Glucose-Capillary Latest Ref Range: 70 - 99 mg/dL 175 (H) 178 (H) 192 (H) 174 (H) 168 (H) 208 (H) 205 (H)   Review of Glycemic Control  Diabetes history: DM2 Outpatient Diabetes medications: Amaryl 1 mg QAM Current orders for Inpatient glycemic control: Novolog 0-9 units Q4H; Osmolite @ 50 ml/hr  Inpatient Diabetes Program Recommendations:   Insulin-Tube Feeding Coverage:  Please consider ordering Novolog 3 units Q4H for tube feeding coverage. If tube feeding is stopped or held then Novolog tube feeding coverage should also be stopped or held.  Thanks, Barnie Alderman, RN, MSN, [name] Diabetes Coordinator Inpatient Diabetes Program [phone] (Team Pager from 8am to 5pm)

## 2019-09-30 NOTE — Progress Notes (Addendum)
PROGRESS NOTE    [name]  IDP:[id number] DOB: 1940-06-11 DOA: 09/23/2019 PCP: Nicolette Bang, DO   Brief Narrative: 79 year old with past medical history significant for paroxysmal A. fib on low-dose aspirin but not on anticoagulation due to chronic anemia history of GI bleed, CAD, COPD, CVA, diabetes type 2, chronic diastolic heart failure, GERD history of gastric ulcer, urinary retention with chronic indwelling Foley catheter who presents from rehab for evaluation of generalized weakness and anemia.  Labs drawn at the facility show worsening anemia hemoglobin of 6.2.  Patient was very somnolent and difficult to arouse on admission.  He was able to say his name, say a few words.  Patient admitted with acute metabolic and cephalopathy, anemia, metabolic acidosis, increased ammonia level. He continued to be  lethargic. He was evaluated by CCM no need for intubation, medical management. Neurology consulted as well, AMS is thought to be related to infection, elevated ammonia level, electrolytes abnormalities.   Palliative consulted. Patient Code status change to DNR. Plan for feeding tube trial for 72 hours.     Assessment & Plan:   Principal Problem:   Symptomatic anemia Active Problems:   Diabetes mellitus, type II (HCC)   Diastolic CHF, chronic (HCC)   GI bleed   Hypokalemia   Acute encephalopathy   Palliative care by specialist   Goals of care, counseling/discussion   DNR (do not resuscitate)   Malnutrition of moderate degree   1-Symptomatic acute blood loss anemia presumed to occult GI bleed: Presents with  hemoglobin of 6.  At baseline 8-9 Occult blood positive. Received 1 unit of packed red blood cell 6-30. And second unit 7/01 CT with presumed fecal impaction.  Tapwater enema ordered GI has been consulted. Hb stable 8---7.9--7.5--8--7.7  no evidence of active GI bleed, continue to monitor hb. Transfusion for hb less than 7. Evaluated by GI, not a  candidate for endoscopy.  Continue with IV Protonix  2-Acute metabolic and hepatic  encephalopathy: Patient presented with altered mental status.  Keep eyes closed, say a few words. ABG pH with respiratory alkalosis.  Metabolic acidosis Support care. Protecting airway Ammonia level elevated at 88 --68--60--48--31--53 EEG negative for seizure. Unable to do MRI due to pacemaker.  Appreciate neurology evaluation.  Hold lactulose due to diarrhea. Started rifaximin. Ammonia 53 but patient today open his eyes, say few words.   3-Sepsis secondary to  CAUTI; patient has chronic Foley catheter due to urinary retention  Patient febrile, hypotensive. Continue with fluids.  IV antibiotics. Plan to change cefepime to Zosyn (7/02) to cover: Citrobacter and enterococcus Faecalis.  UA; with more than 50 white blood cell.  Continue with Zosyn day 5  4-Transaminases: Right upper quadrant ultrasound; Status post cholecystectomy. No biliary dilatation. Unremarkable sonographic appearance of the liver. Might be related to sepsis.   Nutrition;  Plan for feeding tube trial for 72 hours.  Started  Tube feeding 7/04  Hyperammonemia; might be related to bacteria overgrowth. Liver US normal appearance of liver.  Treated  with lactulose. Hold lactulose due to diarrhea. Start rifaximin.  Trend:  65---49---31--58  Paroxysmal A. fib holding aspirin due to concern for GI bleed.  Acute on CKD stage III b; Prior Cr 1.4--1.5  Presents with cr at 1.8. Improved with IV fluids.  Improving. Cr down to 1. 6--1.3--1.2  Diabetes: Continue with sliding scale insulin. Chronic diastolic heart failure: Chest x-ray with progressive central vascular congestion.  Monitor for pulmonary edema on IVF.  B/L ronchus; started  nebulizer Thrombocytopenia;  might be related to acute illness. Stable.  Hypernatremia; change fluids to D 5. Improved.  Hypokalemia; Replete IV Metabolic acidosis; related to infection, AKI, improved  with Bicarb gtt.   Estimated body mass index is 26.4 kg/m as calculated from the following:   Height as of 09/22/19: 5\' 6"  (1.676 m).   Weight as of this encounter: 74.2 kg.   DVT prophylaxis: SCDs no anticoagulation due to GI bleed Code Status: Full code Family Communication: family updated 7/05---7/06 Disposition Plan:  Status is: Inpatient  Remains inpatient appropriate because:Hemodynamically unstable   Dispo: The patient is from: SNF              Anticipated d/c is to: SNF              Anticipated d/c date is: 3 days              Patient currently is not medically stable to d/c.  Still lethargic encephalopathic requiring iv fluids,  IV antibiotics.        Consultants:   CCM  Neurology  Procedures:   EEG  Antimicrobials:  Cefepime  Subjective: He has eyes open, appears less lethargic.  I called [facility] today per family request; they relates patient was eating 50--75 % of his meals, unknown last BM. He was weak the day of transfer and was talking. He was transfer because his hb was low.    Objective: Vitals:   09/30/19 0315 09/30/19 0348 09/30/19 0823 09/30/19 1138  BP: 112/61 (!) 103/57 115/72 113/60  Pulse: 89 99 86 77  Resp: 20 16 18 19   Temp: 99.3 F (37.4 C) 99.7 F (37.6 C) 99.3 F (37.4 C) 98.1 F (36.7 C)  TempSrc: Oral Oral Oral   SpO2: 92% 96% 96% 96%  Weight:        Intake/Output Summary (Last 24 hours) at 09/30/2019 1507 Last data filed at 09/30/2019 0903 Gross per 24 hour  Intake 2735.98 ml  Output 2645 ml  Net 90.98 ml   Filed Weights   09/24/19 2042 09/28/19 0434  Weight: 70.3 kg 74.2 kg    Examination:  General exam: alert, eyes open.  Respiratory system: B/L Ronchus. Cardiovascular system: S 1, S 2 RRR Gastrointestinal system: BS present, soft, nt Central nervous system: he is more alert today, he has his eyes open, he answer yes and no, speech is not clear at times.  Extremities: trace edema   Data Reviewed: I have  personally reviewed following labs and imaging studies  CBC: Recent Labs  Lab 09/23/19 2054 09/24/19 0559 09/26/19 1004 09/27/19 0322 09/28/19 0513 09/29/19 0411 09/30/19 0810  WBC 4.8   < > 4.2 3.4* 3.7* 4.2 5.1  NEUTROABS 2.6  --   --   --   --   --   --   HGB 6.2*   < > 8.1* 7.9* 7.5* 8.2* 7.7*  HCT 20.5*   < > 26.5* 25.8* 24.7* 27.1* 25.5*  MCV 92.3   < > 92.7 92.8 93.9 94.1 91.7  PLT 111*   < > 77* 75* 74* 55* 58*   < > = values in this interval not displayed.   Basic Metabolic Panel: Recent Labs  Lab 09/24/19 0559 09/24/19 1020 09/26/19 0807 09/27/19 0322 09/28/19 0513 09/29/19 0411 09/30/19 0810  NA 136   < > 141 146* 141 136 133*  K 3.3*   < > 4.1 2.9* 2.6* 3.2* 3.2*  CL 106   < > 116* 117* 113*  108 103  CO2 16*   < > 15* 20* 20* 21* 22  GLUCOSE 104*   < > 108* 125* 110* 148* 211*  BUN 16   < > 28* 24* 18 11 9   CREATININE 1.71*   < > 1.62* 1.61* 1.62* 1.39* 1.23  CALCIUM 9.8   < > 10.1 10.2 9.6 8.7* 8.2*  MG 1.8  --   --   --   --  2.1  --    < > = values in this interval not displayed.   GFR: Estimated Creatinine Clearance: 43.2 mL/min (by C-G formula based on SCr of 1.23 mg/dL). Liver Function Tests: Recent Labs  Lab 09/23/19 2054 09/24/19 0559 09/25/19 0927 09/27/19 0322  AST 82* 98* 87* 98*  ALT 50* 48* 49* 54*  ALKPHOS 146* 161* 119 144*  BILITOT 0.7 1.4* 1.2 1.4*  PROT 9.1* 8.8* 8.3* 8.1  ALBUMIN 1.8* 1.8* 1.6* 1.4*   No results for input(s): LIPASE, AMYLASE in the last 168 hours. Recent Labs  Lab 09/26/19 0807 09/27/19 0322 09/28/19 0513 09/29/19 1027 09/30/19 0810  AMMONIA 68* 61* 49* 31 53*   Coagulation Profile: No results for input(s): INR, PROTIME in the last 168 hours. Cardiac Enzymes: No results for input(s): CKTOTAL, CKMB, CKMBINDEX, TROPONINI in the last 168 hours. BNP (last 3 results) No results for input(s): PROBNP in the last 8760 hours. HbA1C: No results for input(s): HGBA1C in the last 72 hours. CBG: Recent Labs    Lab 09/29/19 2006 09/29/19 2346 09/30/19 0352 09/30/19 0826 09/30/19 1136  GLUCAP 174* 168* 208* 205* 203*   Lipid Profile: No results for input(s): CHOL, HDL, LDLCALC, TRIG, CHOLHDL, LDLDIRECT in the last 72 hours. Thyroid Function Tests: No results for input(s): TSH, T4TOTAL, FREET4, T3FREE, THYROIDAB in the last 72 hours. Anemia Panel: No results for input(s): VITAMINB12, FOLATE, FERRITIN, TIBC, IRON, RETICCTPCT in the last 72 hours. Sepsis Labs: Recent Labs  Lab 09/25/19 0927 09/26/19 0253 09/27/19 0322  PROCALCITON 1.63 1.31 0.92  LATICACIDVEN 1.8  --   --     Recent Results (from the past 240 hour(s))  Urine Culture     Status: Abnormal   Collection Time: 09/23/19 11:48 PM   Specimen: Urine, Random  Result Value Ref Range Status   Specimen Description URINE, RANDOM  Final   Special Requests   Final    NONE Performed at [facility], [place] [address], [place], [place] [address]    Culture (A)  Final    >=100,000 COLONIES/mL CITROBACTER FREUNDII >=100,000 COLONIES/mL ENTEROCOCCUS FAECALIS    Report Status 09/28/2019 FINAL  Final   Organism ID, Bacteria CITROBACTER FREUNDII (A)  Final   Organism ID, Bacteria ENTEROCOCCUS FAECALIS (A)  Final      Susceptibility   Citrobacter freundii - MIC*    CEFAZOLIN >=64 RESISTANT Resistant     CEFTRIAXONE <=0.25 SENSITIVE Sensitive     CIPROFLOXACIN <=0.25 SENSITIVE Sensitive     GENTAMICIN <=1 SENSITIVE Sensitive     IMIPENEM <=0.25 SENSITIVE Sensitive     NITROFURANTOIN <=16 SENSITIVE Sensitive     TRIMETH/SULFA <=20 SENSITIVE Sensitive     PIP/TAZO <=4 SENSITIVE Sensitive     * >=100,000 COLONIES/mL CITROBACTER FREUNDII   Enterococcus faecalis - MIC*    AMPICILLIN <=2 SENSITIVE Sensitive     NITROFURANTOIN <=16 SENSITIVE Sensitive     VANCOMYCIN 1 SENSITIVE Sensitive     * >=100,000 COLONIES/mL ENTEROCOCCUS FAECALIS  SARS Coronavirus 2 by RT PCR (hospital order, performed  in [place] lab)  Nasopharyngeal Nasopharyngeal Swab     Status: None   Collection Time: 09/24/19 12:56 AM   Specimen: Nasopharyngeal Swab  Result Value Ref Range Status   SARS Coronavirus 2 NEGATIVE NEGATIVE Final    Comment: (NOTE) SARS-CoV-2 target nucleic acids are NOT DETECTED.  The SARS-CoV-2 RNA is generally detectable in upper and lower respiratory specimens during the acute phase of infection. The lowest concentration of SARS-CoV-2 viral copies this assay can detect is 250 copies / mL. A negative result does not preclude SARS-CoV-2 infection and should not be used as the sole basis for treatment or other patient management decisions.  A negative result may occur with improper specimen collection / handling, submission of specimen other than nasopharyngeal swab, presence of viral mutation(s) within the areas targeted by this assay, and inadequate number of viral copies (<250 copies / mL). A negative result must be combined with clinical observations, patient history, and epidemiological information.  Fact Sheet for Patients:   StrictlyIdeas.no  Fact Sheet for Healthcare Providers: BankingDealers.co.za  This test is not yet approved or  cleared by the [place] FDA and has been authorized for detection and/or diagnosis of SARS-CoV-2 by FDA under an Emergency Use Authorization (EUA).  This EUA will remain in effect (meaning this test can be used) for the duration of the COVID-19 declaration under Section 564(b)(1) of the Act, 21 U.S.C. section 360bbb-3(b)(1), unless the authorization is terminated or revoked sooner.  Performed at [facility], [place] [address], [place], [place] [address]   Culture, blood (routine x 2)     Status: None   Collection Time: 09/24/19  7:12 AM   Specimen: BLOOD LEFT FOREARM  Result Value Ref Range Status   Specimen Description BLOOD LEFT FOREARM  Final   Special Requests   Final    BOTTLES DRAWN AEROBIC  ONLY Blood Culture adequate volume   Culture   Final    NO GROWTH 5 DAYS Performed at [facility], [place] [address], [place], [place] [address]    Report Status 09/29/2019 FINAL  Final  Culture, blood (routine x 2)     Status: None   Collection Time: 09/24/19  8:22 AM   Specimen: BLOOD  Result Value Ref Range Status   Specimen Description BLOOD SITE NOT SPECIFIED  Final   Special Requests   Final    BOTTLES DRAWN AEROBIC AND ANAEROBIC Blood Culture results may not be optimal due to an excessive volume of blood received in culture bottles   Culture   Final    NO GROWTH 5 DAYS Performed at [facility], [place] [address], [place], [place] [address]    Report Status 09/29/2019 FINAL  Final         Radiology Studies: No results found.      Scheduled Meds: . Chlorhexidine Gluconate Cloth  6 each Topical Daily  . feeding supplement (PRO-STAT SUGAR FREE 64)  30 mL Per Tube BID  . ferrous sulfate  15 mg of iron Oral BID  . insulin aspart  0-9 Units Subcutaneous Q4H  . lidocaine  15 mL Mouth/Throat Once  . pantoprazole (PROTONIX) IV  40 mg Intravenous Q12H  . rifaximin  550 mg Per Tube BID  . tamsulosin  0.4 mg Oral Daily  . thiamine injection  100 mg Intravenous Daily   Continuous Infusions: . dextrose 75 mL/hr at 09/30/19 0317  . feeding supplement (OSMOLITE 1.5 CAL) 1,000 mL (09/30/19 1008)  .  piperacillin-tazobactam (ZOSYN)  IV 3.375 g (09/30/19 0826)     LOS: 6 days    Time spent: 35 minutes.     Elmarie Shiley, MD [facility]   If 7PM-7AM, please contact night-coverage [email or web address]  09/30/2019, 3:07 PM

## 2019-09-30 NOTE — Progress Notes (Signed)
Palliative:  Visited patient - opens eyes but does not look at me. Tells me he feels fine. Little verbal interaction. SLP visit reviewed - patient with aspiration and poor intake. Attempted to speak with daughter [name] - no answer, voicemail left. Will attempt to setup meeting with her.   Juel Burrow, DNP, AGNP-C Palliative Medicine Team Team Phone # [phone]  Pager # [phone]  NO CHARGE

## 2019-10-01 DIAGNOSIS — E440 Moderate protein-calorie malnutrition: Secondary | ICD-10-CM

## 2019-10-01 DIAGNOSIS — R1310 Dysphagia, unspecified: Secondary | ICD-10-CM

## 2019-10-01 LAB — COMPREHENSIVE METABOLIC PANEL
ALT: 43 U/L (ref 0–44)
AST: 66 U/L — ABNORMAL HIGH (ref 15–41)
Albumin: 1.3 g/dL — ABNORMAL LOW (ref 3.5–5.0)
Alkaline Phosphatase: 157 U/L — ABNORMAL HIGH (ref 38–126)
Anion gap: 8 (ref 5–15)
BUN: 10 mg/dL (ref 8–23)
CO2: 22 mmol/L (ref 22–32)
Calcium: 8.4 mg/dL — ABNORMAL LOW (ref 8.9–10.3)
Chloride: 107 mmol/L (ref 98–111)
Creatinine, Ser: 1.07 mg/dL (ref 0.61–1.24)
GFR calc Af Amer: >60 mL/min (ref >60)
GFR calc non Af Amer: >60 mL/min (ref >60)
Glucose, Bld: 204 mg/dL — ABNORMAL HIGH (ref 70–99)
Potassium: 3.5 mmol/L (ref 3.5–5.1)
Sodium: 137 mmol/L (ref 135–145)
Total Bilirubin: 1 mg/dL (ref 0.3–1.2)
Total Protein: 8.7 g/dL — ABNORMAL HIGH (ref 6.5–8.1)

## 2019-10-01 LAB — GLUCOSE, CAPILLARY
Glucose-Capillary: 160 mg/dL — ABNORMAL HIGH (ref 70–99)
Glucose-Capillary: 184 mg/dL — ABNORMAL HIGH (ref 70–99)
Glucose-Capillary: 184 mg/dL — ABNORMAL HIGH (ref 70–99)
Glucose-Capillary: 191 mg/dL — ABNORMAL HIGH (ref 70–99)
Glucose-Capillary: 193 mg/dL — ABNORMAL HIGH (ref 70–99)
Glucose-Capillary: 195 mg/dL — ABNORMAL HIGH (ref 70–99)

## 2019-10-01 MED ORDER — THIAMINE HCL 100 MG PO TABS
100.0000 mg | ORAL_TABLET | Freq: Every day | ORAL | Status: DC
  Administered 2019-10-02 – 2019-10-05 (×4): 100 mg
  Filled 2019-10-01 (×4): qty 1

## 2019-10-01 MED ORDER — FERROUS SULFATE 75 (15 FE) MG/ML PO SOLN
15.0000 mg | Freq: Two times a day (BID) | ORAL | Status: DC
  Administered 2019-10-01 – 2019-10-04 (×7): 15 mg
  Filled 2019-10-01 (×9): qty 1

## 2019-10-01 NOTE — Progress Notes (Signed)
SLP Cancellation Note  Patient Details Name: [name] MRN: [id number] DOB: [birth year]   Cancelled treatment:       Reason Eval/Treat Not Completed: Patient declined, no reason specified (Pt was approached for treatment. He was adequately alert for p.o. trials and conversed with the SLP; however, he refused all p.o. intake despite education from SLP regarding the purpose of the session. SLP will follow up on subsequent date.)  Fatin Bachicha I. Hardin Negus, [place], [place] Office number [phone] Pager [place] 10/01/2019, 2:58 PM

## 2019-10-01 NOTE — Progress Notes (Signed)
Occupational Therapy Treatment Patient Details Name: [name] MRN: [id number] DOB: 1940/08/02 Today's Date: 10/01/2019    History of present illness Pt is an 79 y.o. male admitted from [facility] SNF on [facility]/23/19 with generalized weakness. Workup for acute metabolic encephalopathy, anemia, metabolic acidosis, sepsis secondary to CAUTI. PMH includes anemia, atrial flutter/atrial fibrillation, chronic diastolic HF, COPD, W0JW, nonhemorrhagic CVA, chronic indwelling foley catheter.   OT comments  Patient met lying supine in bed in agreement with bed level activity with focus on hygiene/clothing management after leakage of flexiseal and bed mobility to decrease risk of pressure injury. Patient able to state his first name but unable to state last name or D.O.B this session. Total A +2 for rolling R<>L in supine. Anterior scoot toward [facility] with Total A +2. Patient positioned with pillows supporting RUE and between BLE to prevent pressure injury. Patient would benefit from continued acute OT services to maximize independence with UB BADLs and bed mobility and to decrease caregiver burden. Continued recommendation for return to SNF.   Follow Up Recommendations  SNF    Equipment Recommendations  Other (comment) (Defer to next level of care. )    Recommendations for Other Services      Precautions / Restrictions Precautions Precautions: Fall;Other (comment) Precaution Comments: Multiple lines - NGT, catheter, flexiseal Restrictions Weight Bearing Restrictions: No       Mobility Bed Mobility Overal bed mobility: Needs Assistance Bed Mobility: Rolling Rolling: Total assist;+2 for physical assistance   Supine to sit: Total assist;+2 for physical assistance Sit to supine: Total assist;+2 for physical assistance   General bed mobility comments: Rolling R<>L for toileting hygiene after leak of rectal tube. Patient resistant to movement of R<L LE  Transfers                 General  transfer comment: unable to attempt - will require maximove lift transfer OOB    Balance Overall balance assessment: Needs assistance   Sitting balance-Leahy Scale: Zero Sitting balance - Comments: TotalA to maintain balance sitting EOB                                   ADL either performed or assessed with clinical judgement   ADL                                               Vision       Perception     Praxis      Cognition Arousal/Alertness: Lethargic Behavior During Therapy: Flat affect Overall Cognitive Status: Difficult to assess                                 General Comments: Patient able to state first name but unable to state his last name or D.O.B. Patient intermittently follows 1-step vc's this date.         Exercises Other Exercises Other Exercises: Gentle PROM BLE at hip/ankle/knee  Other Exercises: Positioned in supine with LUE elevated, pillow between BLEs to reduce pressure at knee and medial malleoulus (pt rests with BLEs adducted)   Shoulder Instructions       General Comments Pre-mobility BP 125/78, HR 70, SpO2 96% on RA; post-mobility BP 118/75, HR 74, SpO2  96% on RA    Pertinent Vitals/ Pain       Pain Assessment: Faces Faces Pain Scale: Hurts little more Pain Location: Moaning/facial grimacing with rolling R<>L and with movement of all extremities. Patient also c/o pain with perineal hygiene. Pain Descriptors / Indicators: Grimacing;Moaning Pain Intervention(s): Limited activity within patient's tolerance;Monitored during session  Home Living                                          Prior Functioning/Environment              Frequency  Min 1X/week        Progress Toward Goals  OT Goals(current goals can now be found in the care plan section)  Progress towards OT goals: Progressing toward goals  Acute Rehab OT Goals Patient Stated Goal: No goals stated OT  Goal Formulation: Patient unable to participate in goal setting Time For Goal Achievement: 10/11/19 Potential to Achieve Goals: Fair ADL Goals Pt Will Perform Grooming: with mod assist;bed level Pt/caregiver will Perform Home Exercise Program: Increased strength;Both right and left upper extremity;With written HEP provided Additional ADL Goal #1: Pt will follow 1 step simple command with 50% accuracy during ADL/functional task.  Plan Discharge plan remains appropriate    Co-evaluation                 AM-PAC OT "6 Clicks" Daily Activity     Outcome Measure   Help from another person eating meals?: Total Help from another person taking care of personal grooming?: Total Help from another person toileting, which includes using toliet, bedpan, or urinal?: Total Help from another person bathing (including washing, rinsing, drying)?: Total Help from another person to put on and taking off regular upper body clothing?: Total Help from another person to put on and taking off regular lower body clothing?: Total 6 Click Score: 6    End of Session    OT Visit Diagnosis: Muscle weakness (generalized) (M62.81);Other abnormalities of gait and mobility (R26.89);Other symptoms and signs involving cognitive function   Activity Tolerance     Patient Left     Nurse Communication          Time: 9611-6435 OT Time Calculation (min): 32 min  Charges: OT General Charges $OT Visit: 1 Visit OT Treatments $Self Care/Home Management : 23-37 mins  Majesty Stehlin H. OTR/L Supplemental OT, Department of rehab services [phone]   Shericka Johnstone R H. 10/01/2019, 3:51 PM

## 2019-10-01 NOTE — Progress Notes (Signed)
Daily Progress Note   Patient Name: [name]       Date: 10/01/2019 DOB: 24-Jul-1940  Age: 79 y.o. MRN#: [id number] Attending Physician: Oswald Hillock, MD Primary Care Physician: Nicolette Bang, DO Admit Date: 09/23/2019  Reason for Consultation/Follow-up: Establishing goals of care  Subjective: Patient opens eyes when physically stimulated and will say a few words than quickly falls back asleep, per nursing, minimally interactive today  Length of Stay: 7  Current Medications: Scheduled Meds:  . Chlorhexidine Gluconate Cloth  6 each Topical Daily  . feeding supplement (PRO-STAT SUGAR FREE 64)  30 mL Per Tube BID  . ferrous sulfate  15 mg of iron Oral BID  . insulin aspart  0-9 Units Subcutaneous Q4H  . pantoprazole (PROTONIX) IV  40 mg Intravenous Q12H  . rifaximin  550 mg Per Tube BID  . tamsulosin  0.4 mg Oral Daily  . [START ON 10/02/2019] thiamine  100 mg Per Tube Daily    Continuous Infusions: . sodium chloride 75 mL/hr at 10/01/19 0445  . feeding supplement (OSMOLITE 1.5 CAL) 1,000 mL (10/01/19 0446)  . piperacillin-tazobactam (ZOSYN)  IV 3.375 g (10/01/19 0856)    PRN Meds: acetaminophen **OR** acetaminophen, albuterol, white petrolatum  Physical Exam Constitutional:      General: He is not in acute distress.    Comments: lethargic  Pulmonary:     Effort: Pulmonary effort is normal. No respiratory distress.  Abdominal:     Comments: Small bore NG with tube feeding infusing  Musculoskeletal:     Right lower leg: No edema.     Left lower leg: No edema.  Skin:    General: Skin is warm and dry.  Neurological:     Comments: UTA d/t lethargy             Vital Signs: BP 134/62 (BP Location: Right Arm)   Pulse 80   Temp 98.4 F (36.9 C) (Axillary)   Resp 19    Wt 98 kg Comment: taken on air mattress  SpO2 96%   BMI 34.87 kg/m  SpO2: SpO2: 96 % O2 Device: O2 Device: Room Air O2 Flow Rate: O2 Flow Rate (L/min): 1 L/min  Intake/output summary:   Intake/Output Summary (Last 24 hours) at 10/01/2019 1343 Last data filed at 10/01/2019 0174 Gross per 24 hour  Intake 2000.31 ml  Output 3225 ml  Net -1224.69 ml   LBM: Last BM Date: 09/30/19 Baseline Weight: Weight: 70.3 kg Most recent weight: Weight: 98 kg (taken on air mattress)       Palliative Assessment/Data: PPS 20%    Flowsheet Rows     Most Recent Value  Intake Tab  Referral Department Hospitalist  Unit at Time of Referral Cardiac/Telemetry Unit  Date Notified 09/26/19  Palliative Care Type New Palliative care  Reason for referral Clarify Goals of Care  Date of Admission 09/23/19  Date first seen by Palliative Care 09/26/19  # of days Palliative referral response time 0 Day(s)  # of days IP prior to Palliative referral 3  Clinical Assessment  Psychosocial & Spiritual Assessment  Palliative Care Outcomes      Patient Active Problem List   Diagnosis Date Noted  .  Malnutrition of moderate degree 09/29/2019  . Palliative care by specialist   . Goals of care, counseling/discussion   . DNR (do not resuscitate)   . Acute encephalopathy   . GI bleed 09/24/2019  . Hypokalemia 09/24/2019  . HCAP (healthcare-associated pneumonia) 08/03/2019  . Cardiac pacemaker in situ 10/29/2018  . Major depressive disorder, recurrent severe without psychotic features ([place]) 04/15/2018  . Atrial fibrillation with RVR ([place]) 11/08/2017  . HTN (hypertension) 05/21/2017  . Diabetes mellitus, type II ([place]) 05/21/2017  . Diastolic CHF, chronic ([place]) 05/21/2017  . COPD (chronic obstructive pulmonary disease) ([place]) 05/21/2017  . H/O atrial flutter 05/21/2017  . Heme positive stool   . Symptomatic anemia 07/10/2016  . Cardiac syncope 05/15/2015  . Cervical disc disease 05/15/2015  . Anemia 05/15/2015   . History of CVA (cerebrovascular accident) 04/03/2014  . Benign neoplasm of rectum and anal canal 12/02/2013  . Tobacco use disorder 11/30/2013  . Mixed hyperlipidemia 06/06/2013  . Type 2 diabetes mellitus ([place]) 01/22/2013  . Obesity (BMI 30-39.9) 12/14/2012  . Persistent atrial fibrillation ([place]) 10/07/2010  . Atrial flutter - status post CTI ablation 07/29/2010  . Essential hypertension 07/29/2010  . Chronic diastolic congestive heart failure ([place]) 07/29/2010  . Atherosclerotic heart disease of native coronary artery with other forms of angina pectoris ([place]) 07/29/2010    Palliative Care Assessment & Plan   HPI: Per intake H&P -->Decklin Powellis a 79 y.o.malewith medical history significant ofparoxysmal A. fib on [name]-dose aspirin but not anticoagulation due to chronic anemia and history of GI bleed, CAD, COPD, CVA, non-insulin-dependent type 2 diabetes, chronic diastolic CHF, hypertension, hyperlipidemia, GERD, history of gastric ulcer, history of urinary retention with a chronic indwelling Foley catheter presenting to the ED from his rehab facility for evaluation of generalized weakness and anemia. Labs drawn at the facility revealed worsening anemia with hemoglobin 6.2.  Palliative care was asked to aid in goals of care conversations.  Assessment: Attempted to speak with patient's daughter [name] [facility] is listed beside her name in demographics) however she has been unable to answer calls and has not returned voicemail.  Therefore I called other daughter listed in demographics, [name]. [name] tells me that [name] is difficult to speak with over phone d/t work. [name] shares that she lives 6 hours away.  Provided clinical update for [name] - discussed patient's continued AMS. Discussed that we had reached 72 hour trial of tube feedings. Discussed that patient aspirated during SLP evaluation on 7/5. Discussed concern that patient is not improving.    The difference  between aggressive medical intervention and comfort care was considered in light of the patient's goals of care. Discussed patient's lack of improvement with aggressive medical care; discussed consideration of comfort focused care. [name] is unsure about decisions and would like time to discuss with other family members.   Henrine Screws shares that her brother will be at the hospital Friday and Saturday and she asks about PMT meeting with him then.  Discussed with [name] the importance of continued conversation with family and the medical providers regarding overall plan of care and treatment options, ensuring decisions are within the context of the patient's values and GOCs.    I offered to call [name] back tomorrow to further discuss goals of care; however, she took my contact information and plans to call me back whenever she is able to speak to other family members.  Recommendations/Plan:  Patient remains lethargic following 72 hour trial of tube feeding - information shared with daughter -  difficulty reaching other daughter who is listed as POA  Daughter requests time to speak with other family members and plans to call back to further discuss goals of care  Code Status:  DNR  Discharge Planning:  To Be Determined  Care plan was discussed with patient's daughter Henrine Screws, voice mail left for daughter [name]  Thank you for allowing the Palliative Medicine Team to assist in the care of this patient.   Total Time 35 minutes Prolonged Time Billed  no       Greater than 50%  of this time was spent counseling and coordinating care related to the above assessment and plan.  Juel Burrow, DNP, [facility] Palliative Medicine Team Team Phone # [phone]  Pager [phone]

## 2019-10-01 NOTE — Progress Notes (Signed)
Physical Therapy Treatment Patient Details Name: [name] MRN: [id number] DOB: 23-May-1940 Today's Date: 10/01/2019    History of Present Illness Pt is an 79 y.o. male admitted from [facility] SNF on 09/23/19 with generalized weakness. Workup for acute metabolic encephalopathy, anemia, metabolic acidosis, sepsis secondary to CAUTI. PMH includes anemia, atrial flutter/atrial fibrillation, chronic diastolic HF, COPD, S8NI, nonhemorrhagic CVA, chronic indwelling foley catheter.   PT Comments    Pt remains lethargic, requires totalA+2 for bed mobility and sitting EOB activity. Can state name and verbalize a few answers to questions, although difficult to understand; follows simple commands <25% of session. Tolerated prolonged sitting EOB with totalA for BUE/BLE PROM, inconsistent command following for AROM. VSS on RA with SpO2 maintaining >/95%. Pt left with HOB elevated and pillow support to encourage pressure relief. Will continue to follow acutely as pt able to participate.  Pre-mobility BP 125/78 Post-mobility BP 118/73 HR 70s    Follow Up Recommendations  SNF     Equipment Recommendations  None recommended by PT    Recommendations for Other Services       Precautions / Restrictions Precautions Precautions: Fall;Other (comment) Precaution Comments: Multiple lines - NGT, catheter, flexiseal Restrictions Weight Bearing Restrictions: No    Mobility  Bed Mobility Overal bed mobility: Needs Assistance Bed Mobility: Supine to Sit;Sit to Supine     Supine to sit: Total assist;+2 for physical assistance Sit to supine: Total assist;+2 for physical assistance   General bed mobility comments: TotalA+2 for supine<>sit via helicopter technique, pt with minimal truck activation to assist  Transfers                 General transfer comment: unable to attempt - will require maximove lift transfer OOB  Ambulation/Gait                 Stairs              Wheelchair Mobility    Modified Rankin (Stroke Patients Only)       Balance Overall balance assessment: Needs assistance   Sitting balance-Leahy Scale: Zero Sitting balance - Comments: TotalA to maintain balance sitting EOB                                    Cognition Arousal/Alertness: Lethargic Behavior During Therapy: Flat affect Overall Cognitive Status: Difficult to assess                                 General Comments: Responds to nickname and able to state last name, following one-step commands <25% of session, responds to most questions but speech difficult to understand. Eyes closed, but minimal response or interaction with therapist      Exercises Other Exercises Other Exercises: Gentle PROM - bilateral shoulder flex/abd, bilateral elbow flex/ext, knee flex ext -- pt able to actively bring L hand almost to chin without assist (not consistently following cues to perform 2x), would not perform with RUE, although muscle activation noted when asked to shake therapist's hand Other Exercises: Positioned in supine with LUE elevated, pillow between BLEs to reduce pressure at knee and medial malleoulus (pt rests with BLEs adducted)    General Comments General comments (skin integrity, edema, etc.): Pre-mobility BP 125/78, HR 70, SpO2 96% on RA; post-mobility BP 118/75, HR 74, SpO2 96% on RA  Pertinent Vitals/Pain Pain Assessment: Faces Faces Pain Scale: Hurts a little bit Pain Location: Inconsistent grimacing with BLE/BUE PROM Pain Descriptors / Indicators: Grimacing Pain Intervention(s): Monitored during session;Limited activity within patient's tolerance    Home Living                      Prior Function            PT Goals (current goals can now be found in the care plan section) Progress towards PT goals: Not progressing toward goals - comment (limited by lethargy, minimal interaction)    Frequency    Min  2X/week      PT Plan Current plan remains appropriate    Co-evaluation              AM-PAC PT "6 Clicks" Mobility   Outcome Measure  Help needed turning from your back to your side while in a flat bed without using bedrails?: Total Help needed moving from lying on your back to sitting on the side of a flat bed without using bedrails?: Total Help needed moving to and from a bed to a chair (including a wheelchair)?: Total Help needed standing up from a chair using your arms (e.g., wheelchair or bedside chair)?: Total Help needed to walk in hospital room?: Total Help needed climbing 3-5 steps with a railing? : Total 6 Click Score: 6    End of Session   Activity Tolerance: Patient tolerated treatment well;Patient limited by lethargy Patient left: in bed;with call bell/phone within reach;with bed alarm set Nurse Communication: Mobility status;Need for lift equipment PT Visit Diagnosis: Muscle weakness (generalized) (M62.81);Pain;Other abnormalities of gait and mobility (R26.89)     Time: 3536-1443 PT Time Calculation (min) (ACUTE ONLY): 28 min  Charges:  $Therapeutic Exercise: 8-22 mins $Therapeutic Activity: 8-22 mins                    Mabeline Caras, PT, DPT Acute Rehabilitation Services  Pager [phone] [place] 10/01/2019, 12:13 PM

## 2019-10-01 NOTE — Progress Notes (Signed)
[facility]  PROGRESS NOTE  [name] IWP:[id number] DOB: 12-02-1940 DOA: 09/23/2019 PCP: Nicolette Bang, DO   Brief HPI:   79 year old male with medical history of paroxysmal atrial fibrillation on low-dose aspirin, not on anticoagulation due to chronic anemia and history of GI bleed, CAD, COPD, CVA, diabetes mellitus type 2, chronic diastolic heart failure, GERD, history of gastric ulcer, urine retention with chronic indwelling Foley catheter who presented from rehab for evaluation of generalized weakness and anemia.  Labs done at the facility showed hemoglobin of 6.2.  Patient was very somnolent and difficult to arouse on admission.  He was admitted with diagnosis of acute metabolic encephalopathy, anemia, metabolic acidosis, hyperammonemia.  Patient continues to be lethargic he was evaluated by CCM, with CCM recommending no need for intubation.  Neurology consulted and altered mental status thought to be due to metabolic encephalopathy.  Perative care was consulted, CODE STATUS was changed to DNR.  Plan for trial of feeding tube for 72 hours.    Subjective   Patient seen and examined, feeding tube in place, still continues to be lethargic.   Assessment/Plan:     1. Acute blood loss anemia-secondary to GI bleed.  Patient presents with hemoglobin of 6.2, baseline hemoglobin of around 8-9.  Patient received 2 units PRBC.  Hemoglobin is stable at 7.7.  No evidence of active GI bleed.  Continue to monitor hemoglobin and transfuse for hemoglobin less than 7.0.  GI evaluation done, not a candidate for endoscopy.  Continue IV Protonix. 2. Acute metabolic encephalopathy-neurology saw the patient and deemed that he has acute metabolic encephalopathy.  ABG showed pH with respiratory callosal and metabolic acidosis.  EEG negative for seizure.  MRI could not be done due to pacemaker. 3. Hyperammonemia-ammonia was elevated, 88 has come down to 53.  Likely from GI bleed.  Started on  rifaximin.  Lactulose was held due to diarrhea.  Right upper quadrant ultrasound showed no cirrhosis.  S/p cholecystectomy. 4. Transaminitis-right upper quadrant ultrasound unremarkable.  No abnormality seen on liver.  Albumin 1.4, AST 98, ALT 54, alk phos 144, total bili 1.4.  Likely hypotension induced ischemic liver injury.  Follow LFTs in a.m. 5. Sepsis due to CAUTI-patient has chronic indwelling Foley catheter due to urinary tension.  UA showed more than 50 WBCs per high-power field.  IV antibiotics were changed to Zosyn.  Continue IV Zosyn. 6. Nutrition-family and palliative care discussed feeding trial for 72 hours.  Tube feeding was started on 09/28/2019.  72 hours have been completed.  Palliative care discussed with daughter and she wants to discuss with other family members before making decision regarding comfort measures. 7. Paroxysmal atrial fibrillation-aspirin hold due to GI bleed. 8. Acute on chronic kidney disease stage IIIb-prior creatinine 1.4-1.5.  Presented with creatinine 1.8, which improved with IV fluids.  Creatinine is down to 1.2. 9. Diabetes mellitus type 2-continue sliding scale insulin with NovoLog. 10. Hypokalemia-potassium was 3.2 yesterday.  Replaced.  Will recheck potassium today. 11. Metabolic acidosis-likely from sepsis, AKI.  Improved with IV bicarb GTT.    SpO2: 96 % O2 Flow Rate (L/min): 1 L/min   COVID-19 Labs  No results for input(s): DDIMER, FERRITIN, LDH, CRP in the last 72 hours.  Lab Results  Component Value Date   SARSCOV2NAA NEGATIVE 09/24/2019   [place] NEGATIVE 08/03/2019   [place] NEGATIVE 08/01/2019   [place] NEGATIVE 07/28/2019     CBG: Recent Labs  Lab 09/30/19 2212 09/30/19 2336 10/01/19 0532 10/01/19 0816 10/01/19 1236  GLUCAP 183* 195* 160* 184* 191*    CBC: Recent Labs  Lab 09/26/19 1004 09/27/19 0322 09/28/19 0513 09/29/19 0411 09/30/19 0810  WBC 4.2 3.4* 3.7* 4.2 5.1  HGB 8.1* 7.9* 7.5* 8.2* 7.7*  HCT  26.5* 25.8* 24.7* 27.1* 25.5*  MCV 92.7 92.8 93.9 94.1 91.7  PLT 77* 75* 74* 55* 58*    Basic Metabolic Panel: Recent Labs  Lab 09/26/19 0807 09/27/19 0322 09/28/19 0513 09/29/19 0411 09/30/19 0810  NA 141 146* 141 136 133*  K 4.1 2.9* 2.6* 3.2* 3.2*  CL 116* 117* 113* 108 103  CO2 15* 20* 20* 21* 22  GLUCOSE 108* 125* 110* 148* 211*  BUN 28* 24* _0 CREATININE 1.62* 1.61* 1.62* 1.39* 1.23  CALCIUM 10.1 10.2 9.6 8.7* 8.2*  MG  --   --   --  2.1  --      Liver Function Tests: Recent Labs  Lab 09/25/19 0927 09/27/19 0322  AST 87* 98*  ALT 49* 54*  ALKPHOS 119 144*  BILITOT 1.2 1.4*  PROT 8.3* 8.1  ALBUMIN 1.6* 1.4*        DVT prophylaxis: SCDs, no anticoagulation due to GI bleed.  Code Status: DNR  Family Communication: No family at bedside    Status is: Inpatient  Dispo: The patient is from: Skilled nursing facility              Anticipated d/c is to: Skilled nursing facility              Anticipated d/c date is: 10/06/2019              Patient currently not medically stable for discharge  Barrier to discharge-awaiting family discussion with palliative care to clarify goals of care.        Scheduled medications:   Chlorhexidine Gluconate Cloth  6 each Topical Daily   feeding supplement (PRO-STAT SUGAR FREE 64)  30 mL Per Tube BID   ferrous sulfate  15 mg of iron Oral BID   insulin aspart  0-9 Units Subcutaneous Q4H   pantoprazole (PROTONIX) IV  40 mg Intravenous Q12H   rifaximin  550 mg Per Tube BID   tamsulosin  0.4 mg Oral Daily   [START ON 10/02/2019] thiamine  100 mg Per Tube Daily    Consultants:  Perative care  Procedures:  None  Antibiotics:   Anti-infectives (From admission, onward)   Start     Dose/Rate Route Frequency Ordered Stop   09/29/19 1115  rifaximin (XIFAXAN) tablet 550 mg     Discontinue     550 mg Per Tube 2 times daily 09/29/19 1100     09/29/19 1000  rifaximin (XIFAXAN) tablet 550 mg  Status:   Discontinued        550 mg Oral 2 times daily 09/29/19 0917 09/29/19 1100   09/26/19 1800  piperacillin-tazobactam (ZOSYN) IVPB 3.375 g     Discontinue     3.375 g 12.5 mL/hr over 240 Minutes Intravenous Every 8 hours 09/26/19 1405     09/24/19 0630  ceFEPIme (MAXIPIME) 2 g in sodium chloride 0.9 % 100 mL IVPB  Status:  Discontinued        2 g 200 mL/hr over 30 Minutes Intravenous Every 12 hours 09/24/19 0623 09/26/19 1405       Objective   Vitals:   10/01/19 0443 10/01/19 0452 10/01/19 0814 10/01/19 1233  BP:  131/64 130/65 134/62  Pulse:  94 69 80  Resp:  _0 Temp:  98.5 F (36.9 C) 97.9 F (36.6 C) 98.4 F (36.9 C)  TempSrc:  Oral Axillary Axillary  SpO2:  94% 98% 96%  Weight: 98 kg       Intake/Output Summary (Last 24 hours) at 10/01/2019 1634 Last data filed at 10/01/2019 1509 Gross per 24 hour  Intake 2841.87 ml  Output 2125 ml  Net 716.87 ml    07/05 1901 - 07/07 0700 In: 3231.3 [I.V.:1335.2] Out: 9390 [ZESPQ:3300]  Filed Weights   09/24/19 2042 09/28/19 0434 10/01/19 0443  Weight: 70.3 kg 74.2 kg 98 kg    Physical Examination:    General: Appears lethargic  Cardiovascular: S1-S2, regular, no murmur auscultated  Respiratory: Clear to auscultation bilaterally, no wheezing auscultated  Abdomen: Abdomen is soft, nontender, no organomegaly  Extremities: No edema of the lower extremities  Neurologic: Lethargic, opens eyes briefly to verbal stimuli    Data Reviewed:   Recent Results (from the past 240 hour(s))  Urine Culture     Status: Abnormal   Collection Time: 09/23/19 11:48 PM   Specimen: Urine, Random  Result Value Ref Range Status   Specimen Description URINE, RANDOM  Final   Special Requests   Final    NONE Performed at [facility], [place] [address], [place], [place] [address]    Culture (A)  Final    >=100,000 COLONIES/mL CITROBACTER FREUNDII >=100,000 COLONIES/mL ENTEROCOCCUS FAECALIS    Report Status 09/28/2019 FINAL   Final   Organism ID, Bacteria CITROBACTER FREUNDII (A)  Final   Organism ID, Bacteria ENTEROCOCCUS FAECALIS (A)  Final      Susceptibility   Citrobacter freundii - MIC*    CEFAZOLIN >=64 RESISTANT Resistant     CEFTRIAXONE <=0.25 SENSITIVE Sensitive     CIPROFLOXACIN <=0.25 SENSITIVE Sensitive     GENTAMICIN <=1 SENSITIVE Sensitive     IMIPENEM <=0.25 SENSITIVE Sensitive     NITROFURANTOIN <=16 SENSITIVE Sensitive     TRIMETH/SULFA <=20 SENSITIVE Sensitive     PIP/TAZO <=4 SENSITIVE Sensitive     * >=100,000 COLONIES/mL CITROBACTER FREUNDII   Enterococcus faecalis - MIC*    AMPICILLIN <=2 SENSITIVE Sensitive     NITROFURANTOIN <=16 SENSITIVE Sensitive     VANCOMYCIN 1 SENSITIVE Sensitive     * >=100,000 COLONIES/mL ENTEROCOCCUS FAECALIS  SARS Coronavirus 2 by RT PCR (hospital order, performed in [facility] hospital lab) Nasopharyngeal Nasopharyngeal Swab     Status: None   Collection Time: 09/24/19 12:56 AM   Specimen: Nasopharyngeal Swab  Result Value Ref Range Status   SARS Coronavirus 2 NEGATIVE NEGATIVE Final    Comment: (NOTE) SARS-CoV-2 target nucleic acids are NOT DETECTED.  The SARS-CoV-2 RNA is generally detectable in upper and lower respiratory specimens during the acute phase of infection. The lowest concentration of SARS-CoV-2 viral copies this assay can detect is 250 copies / mL. A negative result does not preclude SARS-CoV-2 infection and should not be used as the sole basis for treatment or other patient management decisions.  A negative result may occur with improper specimen collection / handling, submission of specimen other than nasopharyngeal swab, presence of viral mutation(s) within the areas targeted by this assay, and inadequate number of viral copies (<250 copies / mL). A negative result must be combined with clinical observations, patient history, and epidemiological information.  Fact Sheet for Patients:    StrictlyIdeas.no  Fact Sheet for Healthcare Providers: BankingDealers.co.za  This test is not yet approved or  cleared by  the [employer] and has been authorized for detection and/or diagnosis of SARS-CoV-2 by FDA under an Emergency Use Authorization (EUA).  This EUA will remain in effect (meaning this test can be used) for the duration of the COVID-19 declaration under Section 564(b)(1) of the Act, 21 U.S.C. section 360bbb-3(b)(1), unless the authorization is terminated or revoked sooner.  Performed at [facility], [place] [address], [place], [place] [address]   Culture, blood (routine x 2)     Status: None   Collection Time: 09/24/19  7:12 AM   Specimen: BLOOD LEFT FOREARM  Result Value Ref Range Status   Specimen Description BLOOD LEFT FOREARM  Final   Special Requests   Final    BOTTLES DRAWN AEROBIC ONLY Blood Culture adequate volume   Culture   Final    NO GROWTH 5 DAYS Performed at [facility], [place] [address], [place], [place] [address]    Report Status 09/29/2019 FINAL  Final  Culture, blood (routine x 2)     Status: None   Collection Time: 09/24/19  8:22 AM   Specimen: BLOOD  Result Value Ref Range Status   Specimen Description BLOOD SITE NOT SPECIFIED  Final   Special Requests   Final    BOTTLES DRAWN AEROBIC AND ANAEROBIC Blood Culture results may not be optimal due to an excessive volume of blood received in culture bottles   Culture   Final    NO GROWTH 5 DAYS Performed at [facility], [place] [address] [place],  [address]    Report Status 09/29/2019 FINAL  Final    No results for input(s): LIPASE, AMYLASE in the last 168 hours. Recent Labs  Lab 09/26/19 0807 09/27/19 0322 09/28/19 0513 09/29/19 1027 09/30/19 0810  AMMONIA 68* 61* 49* 31 53*    Cardiac Enzymes: No results for input(s): CKTOTAL, CKMB, CKMBINDEX, TROPONINI in the last 168 hours. BNP (last 3 results) Recent  Labs    09/24/19 0559  BNP 550.9*    ProBNP (last 3 results) No results for input(s): PROBNP in the last 8760 hours.  Studies:  No results found.     Oswald Hillock   [facility] If 7PM-7AM, please contact night-coverage at [email or web address], Office  [phone]   10/01/2019, 4:34 PM  LOS: 7 days

## 2019-10-01 NOTE — Progress Notes (Signed)
Inpatient Diabetes Program Recommendations  AACE/ADA: New Consensus Statement on Inpatient Glycemic Control  Target Ranges:  Prepandial:   less than 140 mg/dL      Peak postprandial:   less than 180 mg/dL (1-2 hours)      Critically ill patients:  140 - 180 mg/dL   Results for [name], Trevor "[name]" (MRN [id number]) as of 10/01/2019 09:28  Ref. Range 09/30/2019 08:26 09/30/2019 11:36 09/30/2019 15:33 09/30/2019 22:12 09/30/2019 23:36 10/01/2019 05:32 10/01/2019 08:16  Glucose-Capillary Latest Ref Range: 70 - 99 mg/dL 205 (H) 203 (H) 210 (H) 183 (H) 195 (H) 160 (H) 184 (H)    Review of Glycemic Control  Diabetes history: DM2 Outpatient Diabetes medications: Amaryl 1 mg QAM Current orders for Inpatient glycemic control: Novolog 0-9 units Q4H; Osmolite @ 50 ml/hr  Inpatient Diabetes Program Recommendations:   Insulin-Tube Feeding Coverage:  Please consider ordering Novolog 3 units Q4H for tube feeding coverage. If tube feeding is stopped or held then Novolog tube feeding coverage should also be stopped or held.  Thanks, Tama Headings RN, MSN, BC-ADM Inpatient Diabetes Coordinator Team Pager [phone] (8a-5p)

## 2019-10-01 NOTE — Consult Note (Signed)
   [facility] Inpatient Consult   10/01/2019  [name] Aug 16, 1940 [id number]   [facility] [employer] [ACO] Patient:  Medicare Next Gen   Patient screened for extreme high risk score for unplanned readmission score and for 3 hospitalizations and 4 ED visits in the past 6 months.  Medical record reviewed to check for potential [facility] service needs.  Review of patient's medical record reveals patient is has been followed by [facility] RN PAC while patient was in [facility] skilled nursing facility. Also review reveals a palliative care consult has been initiated.   Plan: Continue to follow for progress and disposition to assess for post hospital care management needs.    For questions contact:   Natividad Brood, RN BSN [facility] Liaison  [phone] business mobile phone Toll free office [phone]  Fax number: [phone] [email or web address]

## 2019-10-02 LAB — GLUCOSE, CAPILLARY
Glucose-Capillary: 141 mg/dL — ABNORMAL HIGH (ref 70–99)
Glucose-Capillary: 154 mg/dL — ABNORMAL HIGH (ref 70–99)
Glucose-Capillary: 164 mg/dL — ABNORMAL HIGH (ref 70–99)
Glucose-Capillary: 165 mg/dL — ABNORMAL HIGH (ref 70–99)
Glucose-Capillary: 167 mg/dL — ABNORMAL HIGH (ref 70–99)
Glucose-Capillary: 182 mg/dL — ABNORMAL HIGH (ref 70–99)
Glucose-Capillary: 214 mg/dL — ABNORMAL HIGH (ref 70–99)

## 2019-10-02 NOTE — Progress Notes (Signed)
Palliative:  Was able to speak to daughter Henrine Screws 7/7 (see 7/7 note for details) - unable to reach any decisions regarding goals of care and [name] asked for time to speak with other family members. Asked that I not call back and she would call us back when she was ready.  Assessed patient today - remains minimally interactive, mostly sleeping, SLP note indicates he would not accept any POs - Did attempt to call daughter [name] 7/8 as she is listed as POA in demographics (do not have  legal documentation of this) - I have attempted to call her 7/6 and 7/7 - no answer and no return call despite multiple attempts.   Will await call back from family today - Son is supposed to be at bedside per [facility] 7/9 & 7/10 - PMT will attempt to follow up with him then.  Juel Burrow, DNP, AGNP-C Palliative Medicine Team Team Phone # [phone]  Pager # [phone]  NO CHARGE

## 2019-10-02 NOTE — Progress Notes (Signed)
Inpatient Diabetes Program Recommendations  AACE/ADA: New Consensus Statement on Inpatient Glycemic Control  Target Ranges:  Prepandial:   less than 140 mg/dL      Peak postprandial:   less than 180 mg/dL (1-2 hours)      Critically ill patients:  140 - 180 mg/dL   Results for [name], Trevor "[name]" (MRN [id number]) as of 10/01/2019 09:28  Ref. Range 09/30/2019 08:26 09/30/2019 11:36 09/30/2019 15:33 09/30/2019 22:12 09/30/2019 23:36 10/01/2019 05:32 10/01/2019 08:16  Glucose-Capillary Latest Ref Range: 70 - 99 mg/dL 205 (H) 203 (H) 210 (H) 183 (H) 195 (H) 160 (H) 184 (H)   Results for [name], Trevor "[name]" (MRN [id number]) as of 10/02/2019 11:02  Ref. Range 10/01/2019 08:16 10/01/2019 12:36 10/01/2019 16:56 10/01/2019 20:04 10/01/2019 23:33 10/02/2019 03:25 10/02/2019 08:26  Glucose-Capillary Latest Ref Range: 70 - 99 mg/dL 184 (H) 191 (H) 193 (H) 184 (H) 164 (H) 141 (H) 182 (H)   Review of Glycemic Control  Diabetes history: DM2 Outpatient Diabetes medications: Amaryl 1 mg QAM Current orders for Inpatient glycemic control: Novolog 0-9 units Q4H; Osmolite @ 50 ml/hr  Inpatient Diabetes Program Recommendations:   Insulin-Tube Feeding Coverage:  Please consider ordering Novolog 3 units Q4H for tube feeding coverage. If tube feeding is stopped or held then Novolog tube feeding coverage should also be stopped or held.  Thanks, Tama Headings RN, MSN, BC-ADM Inpatient Diabetes Coordinator Team Pager [phone] (8a-5p)

## 2019-10-02 NOTE — Progress Notes (Signed)
  Speech Language Pathology Treatment: Dysphagia  Patient Details Name: [name] MRN: [id number] DOB: [birth year] Today's Date: 10/02/2019 Time: 5364-6803 SLP Time Calculation (min) (ACUTE ONLY): 10 min  Assessment / Plan / Recommendation Clinical Impression  Pt was seen for dysphagia treatment. He was alert throughout the session and communicated verbally with reduced speech intelligibility. SLP educated pt regarding the purpose and benefits of the session. Pt accepted less than a 1/2 tsp bolus of puree and spat out/blew the majority of the puree bolus and all liquid/ice chip boluses which were presented to/between his lips. A single swallow was noted with puree and wet vocal quality was demonstrated following reclining at end of session which was improved with being repositioned more upright. Aspiration of trace puree and/or poor secretion management are therefore suspected. Pt has not demonstrated any significant progress since he was initially evaluated by speech pathology on 09/26/19 and he continues to present as a poor candidate for p.o. intake. SLP will continue to follow for a brief while longer.    HPI HPI: Pt is an 79 y.o. male with medical history significant of paroxysmal A. fib on low-dose aspirin but not anticoagulation due to chronic anemia and history of GI bleed, CAD, COPD, CVA, non-insulin-dependent type 2 diabetes, chronic diastolic CHF, hypertension, hyperlipidemia, GERD, history of gastric ulcer, history of urinary retention with a chronic indwelling Foley catheter who presented to the ED from his rehab facility for evaluation of generalized weakness and anemia. CT head 6/30 negative for acute changes. CXR 6/30: Progressive central vascular congestion without overt edema. EEG: moderate diffuse slowing.  Findings are suggestive of an underlying encephalopathy possibly related to toxic, metabolic conditions, versus diffuse structural brain abnormalities.      SLP Plan  Continue  with current plan of care       Recommendations  Diet recommendations: NPO Medication Administration: Via alternative means                Oral Care Recommendations: Oral care QID Follow up Recommendations: Other (comment) (TBD) SLP Visit Diagnosis: Dysphagia, oropharyngeal phase (R13.12) Plan: Continue with current plan of care       [name] I. Hardin Negus, [place], [place] Office number [phone] Pager [place] 10/02/2019, 5:21 PM

## 2019-10-02 NOTE — Plan of Care (Signed)
  Problem: Education: Goal: Knowledge of General Education information will improve Description: Including pain rating scale, medication(s)/side effects and non-pharmacologic comfort measures Outcome: Not Progressing   Problem: Health Behavior/Discharge Planning: Goal: Ability to manage health-related needs will improve Outcome: Not Progressing   

## 2019-10-02 NOTE — Progress Notes (Signed)
[facility]  PROGRESS NOTE  [name] IOX:[id number] DOB: 1940/08/07 DOA: 09/23/2019 PCP: Nicolette Bang, DO   Brief HPI:   79 year old male with medical history of paroxysmal atrial fibrillation on low-dose aspirin, not on anticoagulation due to chronic anemia and history of GI bleed, CAD, COPD, CVA, diabetes mellitus type 2, chronic diastolic heart failure, GERD, history of gastric ulcer, urine retention with chronic indwelling Foley catheter who presented from rehab for evaluation of generalized weakness and anemia.  Labs done at the facility showed hemoglobin of 6.2.  Patient was very somnolent and difficult to arouse on admission.  He was admitted with diagnosis of acute metabolic encephalopathy, anemia, metabolic acidosis, hyperammonemia.  Patient continues to be lethargic he was evaluated by CCM, with CCM recommending no need for intubation.  Neurology consulted and altered mental status thought to be due to metabolic encephalopathy.  Perative care was consulted, CODE STATUS was changed to DNR.  Plan for trial of feeding tube for 72 hours.    Subjective   Patient seen and examined, he is more interactive today.  Answered questions appropriately.  Though he has been refusing p.o.'s per SLP.[name]   Assessment/Plan:     1. Acute blood loss anemia-secondary to GI bleed.  Patient presents with hemoglobin of 6.2, baseline hemoglobin of around 8-9.  Patient received 2 units PRBC.  Hemoglobin is stable at 7.7.  No evidence of active GI bleed.  Continue to monitor hemoglobin and transfuse for hemoglobin less than 7.0.  GI evaluation done, not a candidate for endoscopy.  Continue IV Protonix. 2. Acute metabolic encephalopathy-neurology saw the patient and deemed that he has acute metabolic encephalopathy.  ABG showed pH with respiratory callosal and metabolic acidosis.  EEG negative for seizure.  MRI could not be done due to pacemaker. 3. Hyperammonemia-ammonia was elevated, 88  has come down to 53.  Likely from GI bleed.  Started on rifaximin.  Lactulose was held due to diarrhea.  Right upper quadrant ultrasound showed no cirrhosis.  S/p cholecystectomy. 4. Transaminitis-right upper quadrant ultrasound unremarkable.  No abnormality seen on liver.  Albumin 1.4, AST 98, ALT 54, alk phos 144, total bili 1.4.  Likely hypotension induced ischemic liver injury.  Follow LFTs in a.m. 5. Sepsis due to CAUTI-patient has chronic indwelling Foley catheter due to urinary tension.  UA showed more than 50 WBCs per high-power field.  IV antibiotics were changed to Zosyn.  Continue IV Zosyn. 6. Nutrition-family and palliative care discussed feeding trial for 72 hours.  Tube feeding was started on 09/28/2019.  72 hours have been completed.  Palliative care discussed with daughter and she wants to discuss with other family members before making decision regarding comfort measures. 7. Paroxysmal atrial fibrillation-aspirin hold due to GI bleed. 8. Acute on chronic kidney disease stage IIIb-prior creatinine 1.4-1.5.  Presented with creatinine 1.8, which improved with IV fluids.  Creatinine is down to 1.2. 9. Diabetes mellitus type 2-continue sliding scale insulin with NovoLog. 10. Hypokalemia-potassium was 3.2 yesterday.  Replaced.  Will recheck potassium today. 11. Metabolic acidosis-likely from sepsis, AKI.  Improved with IV bicarb GTT.    SpO2: 97 % O2 Flow Rate (L/min): 1 L/min   COVID-19 Labs  No results for input(s): DDIMER, FERRITIN, LDH, CRP in the last 72 hours.  Lab Results  Component Value Date   SARSCOV2NAA NEGATIVE 09/24/2019   [place] NEGATIVE 08/03/2019   [place] NEGATIVE 08/01/2019   [place] NEGATIVE 07/28/2019     CBG: Recent Labs  Lab 10/01/19 2004  10/01/19 2333 10/02/19 0325 10/02/19 0826 10/02/19 1208  GLUCAP 184* 164* 141* 182* 214*    CBC: Recent Labs  Lab 09/26/19 1004 09/27/19 0322 09/28/19 0513 09/29/19 0411 09/30/19 0810  WBC 4.2  3.4* 3.7* 4.2 5.1  HGB 8.1* 7.9* 7.5* 8.2* 7.7*  HCT 26.5* 25.8* 24.7* 27.1* 25.5*  MCV 92.7 92.8 93.9 94.1 91.7  PLT 77* 75* 74* 55* 58*    Basic Metabolic Panel: Recent Labs  Lab 09/27/19 0322 09/28/19 0513 09/29/19 0411 09/30/19 0810 10/01/19 1702  NA 146* 141 136 133* 137  K 2.9* 2.6* 3.2* 3.2* 3.5  CL 117* 113* 108 103 107  CO2 20* 20* 21* 22 22  GLUCOSE 125* 110* 148* 211* 204*  BUN 24* 18 11 9 10   CREATININE 1.61* 1.62* 1.39* 1.23 1.07  CALCIUM 10.2 9.6 8.7* 8.2* 8.4*  MG  --   --  2.1  --   --      Liver Function Tests: Recent Labs  Lab 09/27/19 0322 10/01/19 1702  AST 98* 66*  ALT 54* 43  ALKPHOS 144* 157*  BILITOT 1.4* 1.0  PROT 8.1 8.7*  ALBUMIN 1.4* 1.3*        DVT prophylaxis: SCDs, no anticoagulation due to GI bleed.  Code Status: DNR  Family Communication: No family at bedside    Status is: Inpatient  Dispo: The patient is from: Skilled nursing facility              Anticipated d/c is to: Skilled nursing facility              Anticipated d/c date is: 10/06/2019              Patient currently not medically stable for discharge  Barrier to discharge-awaiting family discussion with palliative care to clarify goals of care.        Scheduled medications:  . Chlorhexidine Gluconate Cloth  6 each Topical Daily  . feeding supplement (PRO-STAT SUGAR FREE 64)  30 mL Per Tube BID  . ferrous sulfate  15 mg of iron Per Tube BID  . insulin aspart  0-9 Units Subcutaneous Q4H  . pantoprazole (PROTONIX) IV  40 mg Intravenous Q12H  . rifaximin  550 mg Per Tube BID  . tamsulosin  0.4 mg Oral Daily  . thiamine  100 mg Per Tube Daily    Consultants:  Perative care  Procedures:  None  Antibiotics:   Anti-infectives (From admission, onward)   Start     Dose/Rate Route Frequency Ordered Stop   09/29/19 1115  rifaximin (XIFAXAN) tablet 550 mg     Discontinue     550 mg Per Tube 2 times daily 09/29/19 1100     09/29/19 1000  rifaximin  (XIFAXAN) tablet 550 mg  Status:  Discontinued        550 mg Oral 2 times daily 09/29/19 0917 09/29/19 1100   09/26/19 1800  piperacillin-tazobactam (ZOSYN) IVPB 3.375 g     Discontinue     3.375 g 12.5 mL/hr over 240 Minutes Intravenous Every 8 hours 09/26/19 1405 10/02/19 2359   09/24/19 0630  ceFEPIme (MAXIPIME) 2 g in sodium chloride 0.9 % 100 mL IVPB  Status:  Discontinued        2 g 200 mL/hr over 30 Minutes Intravenous Every 12 hours 09/24/19 0623 09/26/19 1405       Objective   Vitals:   10/01/19 2334 10/02/19 0323 10/02/19 0821 10/02/19 1212  BP:  (!) 118/59 (!) 112/56 [name])  114/56  Pulse: 91 76 75 86  Resp: 19 16 17 18   Temp:  99.2 F (37.3 C) 98.4 F (36.9 C) 98.4 F (36.9 C)  TempSrc:  Axillary Axillary Oral  SpO2: 92% 92% 97% 97%  Weight:  99 kg      Intake/Output Summary (Last 24 hours) at 10/02/2019 1531 Last data filed at 10/02/2019 1220 Gross per 24 hour  Intake --  Output 3000 ml  Net -3000 ml    07/06 1901 - 07/08 0700 In: 1475.7 [I.V.:775.8] Out: 7124 [Urine:4125]  Filed Weights   09/28/19 0434 10/01/19 0443 10/02/19 0323  Weight: 74.2 kg 98 kg 99 kg    Physical Examination:   General-continues to be lethargic  Heart-S1-S2, regular, no murmur auscultated  Lungs-clear to auscultation bilaterally, no wheezing or crackles auscultated  Abdomen-soft, nontender, no organomegaly  Extremities-no edema in the lower extremities  Neuro-alert, following commands    Data Reviewed:   Recent Results (from the past 240 hour(s))  Urine Culture     Status: Abnormal   Collection Time: 09/23/19 11:48 PM   Specimen: Urine, Random  Result Value Ref Range Status   Specimen Description URINE, RANDOM  Final   Special Requests   Final    NONE Performed at [facility], [address] [place], [place] [address]    Culture (A)  Final    >=100,000 COLONIES/mL CITROBACTER FREUNDII >=100,000 COLONIES/mL ENTEROCOCCUS FAECALIS    Report Status  09/28/2019 FINAL  Final   Organism ID, Bacteria CITROBACTER FREUNDII (A)  Final   Organism ID, Bacteria ENTEROCOCCUS FAECALIS (A)  Final      Susceptibility   Citrobacter freundii - MIC*    CEFAZOLIN >=64 RESISTANT Resistant     CEFTRIAXONE <=0.25 SENSITIVE Sensitive     CIPROFLOXACIN <=0.25 SENSITIVE Sensitive     GENTAMICIN <=1 SENSITIVE Sensitive     IMIPENEM <=0.25 SENSITIVE Sensitive     NITROFURANTOIN <=16 SENSITIVE Sensitive     TRIMETH/SULFA <=20 SENSITIVE Sensitive     PIP/TAZO <=4 SENSITIVE Sensitive     * >=100,000 COLONIES/mL CITROBACTER FREUNDII   Enterococcus faecalis - MIC*    AMPICILLIN <=2 SENSITIVE Sensitive     NITROFURANTOIN <=16 SENSITIVE Sensitive     VANCOMYCIN 1 SENSITIVE Sensitive     * >=100,000 COLONIES/mL ENTEROCOCCUS FAECALIS  SARS Coronavirus 2 by RT PCR (hospital order, performed in [place] hospital lab) Nasopharyngeal Nasopharyngeal Swab     Status: None   Collection Time: 09/24/19 12:56 AM   Specimen: Nasopharyngeal Swab  Result Value Ref Range Status   SARS Coronavirus 2 NEGATIVE NEGATIVE Final    Comment: (NOTE) SARS-CoV-2 target nucleic acids are NOT DETECTED.  The SARS-CoV-2 RNA is generally detectable in upper and lower respiratory specimens during the acute phase of infection. The lowest concentration of SARS-CoV-2 viral copies this assay can detect is 250 copies / mL. A negative result does not preclude SARS-CoV-2 infection and should not be used as the sole basis for treatment or other patient management decisions.  A negative result may occur with improper specimen collection / handling, submission of specimen other than nasopharyngeal swab, presence of viral mutation(s) within the areas targeted by this assay, and inadequate number of viral copies (<250 copies / mL). A negative result must be combined with clinical observations, patient history, and epidemiological information.  Fact Sheet for Patients:    StrictlyIdeas.no  Fact Sheet for Healthcare Providers: BankingDealers.co.za  This test is not yet approved or  cleared by  the [employer] and has been authorized for detection and/or diagnosis of SARS-CoV-2 by FDA under an Emergency Use Authorization (EUA).  This EUA will remain in effect (meaning this test can be used) for the duration of the COVID-19 declaration under Section 564(b)(1) of the Act, 21 U.S.C. section 360bbb-3(b)(1), unless the authorization is terminated or revoked sooner.  Performed at [facility], [place] [address] [place], [place] [address]   Culture, blood (routine x 2)     Status: None   Collection Time: 09/24/19  7:12 AM   Specimen: BLOOD LEFT FOREARM  Result Value Ref Range Status   Specimen Description BLOOD LEFT FOREARM  Final   Special Requests   Final    BOTTLES DRAWN AEROBIC ONLY Blood Culture adequate volume   Culture   Final    NO GROWTH 5 DAYS Performed at [facility], [place] [address], [place], [place] [address]    Report Status 09/29/2019 FINAL  Final  Culture, blood (routine x 2)     Status: None   Collection Time: 09/24/19  8:22 AM   Specimen: BLOOD  Result Value Ref Range Status   Specimen Description BLOOD SITE NOT SPECIFIED  Final   Special Requests   Final    BOTTLES DRAWN AEROBIC AND ANAEROBIC Blood Culture results may not be optimal due to an excessive volume of blood received in culture bottles   Culture   Final    NO GROWTH 5 DAYS Performed at [facility], [place] [address] [place], [place] [address]    Report Status 09/29/2019 FINAL  Final    No results for input(s): LIPASE, AMYLASE in the last 168 hours. Recent Labs  Lab 09/26/19 0807 09/27/19 0322 09/28/19 0513 09/29/19 1027 09/30/19 0810  AMMONIA 68* 61* 49* 31 53*    Cardiac Enzymes: No results for input(s): CKTOTAL, CKMB, CKMBINDEX, TROPONINI in the last 168 hours. BNP (last 3 results) Recent  Labs    09/24/19 0559  BNP 550.9*    ProBNP (last 3 results) No results for input(s): PROBNP in the last 8760 hours.  Studies:  No results found.     Oswald Hillock   [facility] If 7PM-7AM, please contact night-coverage at [email or web address], Office  [phone]   10/02/2019, 3:31 PM  LOS: 8 days

## 2019-10-02 NOTE — Progress Notes (Signed)
Ok to add stop date of 7d for zosyn per Dr. Darrick Meigs.  Onnie Boer, PharmD, BCIDP, AAHIVP, CPP Infectious Disease Pharmacist 10/02/2019 10:46 AM

## 2019-10-03 DIAGNOSIS — E1165 Type 2 diabetes mellitus with hyperglycemia: Secondary | ICD-10-CM

## 2019-10-03 DIAGNOSIS — I5032 Chronic diastolic (congestive) heart failure: Secondary | ICD-10-CM

## 2019-10-03 LAB — TROPONIN I (HIGH SENSITIVITY)
Troponin I (High Sensitivity): 12 ng/L (ref <18)
Troponin I (High Sensitivity): 12 ng/L (ref <18)

## 2019-10-03 LAB — GLUCOSE, CAPILLARY
Glucose-Capillary: 153 mg/dL — ABNORMAL HIGH (ref 70–99)
Glucose-Capillary: 171 mg/dL — ABNORMAL HIGH (ref 70–99)
Glucose-Capillary: 181 mg/dL — ABNORMAL HIGH (ref 70–99)
Glucose-Capillary: 183 mg/dL — ABNORMAL HIGH (ref 70–99)

## 2019-10-03 NOTE — Progress Notes (Addendum)
[facility]  PROGRESS NOTE  [name] JHE:[id number] DOB: 1941/02/10 DOA: 09/23/2019 PCP: Nicolette Bang, DO   Brief HPI:   79 year old male with medical history of paroxysmal atrial fibrillation on low-dose aspirin, not on anticoagulation due to chronic anemia and history of GI bleed, CAD, COPD, CVA, diabetes mellitus type 2, chronic diastolic heart failure, GERD, history of gastric ulcer, urine retention with chronic indwelling Foley catheter who presented from rehab for evaluation of generalized weakness and anemia.  Labs done at the facility showed hemoglobin of 6.2.  Patient was very somnolent and difficult to arouse on admission.  He was admitted with diagnosis of acute metabolic encephalopathy, anemia, metabolic acidosis, hyperammonemia.  Patient continues to be lethargic he was evaluated by CCM, with CCM recommending no need for intubation.  Neurology consulted and altered mental status thought to be due to metabolic encephalopathy.  Perative care was consulted, CODE STATUS was changed to DNR.  Plan for trial of feeding tube for 72 hours.    Subjective   Patient seen and examined, he is alert, complained of chest pain today   Assessment/Plan:     1. Acute blood loss anemia-secondary to GI bleed.  Patient presents with hemoglobin of 6.2, baseline hemoglobin of around 8-9.  Patient received 2 units PRBC.  Hemoglobin is stable at 7.7.  No evidence of active GI bleed.  Continue to monitor hemoglobin and transfuse for hemoglobin less than 7.0.  GI evaluation done, not a candidate for endoscopy.  Continue IV Protonix. 2. Chest pain-EKG showed atrial fibrillation, will obtain troponin x2.  He is not a candidate for aggressive cardiac intervention. 3. Acute metabolic encephalopathy-neurology saw the patient and deemed that he has acute metabolic encephalopathy.  ABG showed pH with respiratory callosal and metabolic acidosis.  EEG negative for seizure.  MRI could not be done  due to pacemaker. 4. Hyperammonemia-ammonia was elevated, 88 has come down to 53.  Likely from GI bleed.  Started on rifaximin.  Lactulose was held due to diarrhea.  Right upper quadrant ultrasound showed no cirrhosis.  S/p cholecystectomy. 5. Transaminitis-right upper quadrant ultrasound unremarkable.  No abnormality seen on liver.  Albumin 1.4, AST 98, ALT 54, alk phos 144, total bili 1.4.  Likely hypotension induced ischemic liver injury.  Follow LFTs in a.m. 6. Sepsis due to CAUTI-patient has chronic indwelling Foley catheter due to urinary retention.  UA showed more than 50 WBCs per high-power field.  IV antibiotics were changed to Zosyn.  Continue IV Zosyn. 7. Nutrition-family and palliative care discussed feeding trial for 72 hours.  Tube feeding was started on 09/28/2019.  72 hours have been completed.  Palliative care discussed with daughter and she wants to discuss with other family members before making decision regarding comfort measures.  Patient's son is arriving tomorrow.  Will discuss with patient's son and palliative care regarding further clarification of goals of care. 8. Paroxysmal atrial fibrillation-aspirin hold due to GI bleed. 9. Acute on chronic kidney disease stage IIIb-prior creatinine 1.4-1.5.  Presented with creatinine 1.8, which improved with IV fluids.  Creatinine is down to 1.07 10. Diabetes mellitus type 2-continue sliding scale insulin with NovoLog. 11. Hypokalemia-replete 12. Metabolic acidosis-likely from sepsis, AKI.  Improved with IV bicarb GTT.    SpO2: 100 % O2 Flow Rate (L/min): 1 L/min   COVID-19 Labs  No results for input(s): DDIMER, FERRITIN, LDH, CRP in the last 72 hours.  Lab Results  Component Value Date   SARSCOV2NAA NEGATIVE 09/24/2019   SARSCOV2NAA NEGATIVE  08/03/2019   [place] NEGATIVE 08/01/2019   [place] NEGATIVE 07/28/2019     CBG: Recent Labs  Lab 10/02/19 2012 10/02/19 2351 10/03/19 0427 10/03/19 0747 10/03/19 1212   GLUCAP 167* 154* 153* 171* 181*    CBC: Recent Labs  Lab 09/27/19 0322 09/28/19 0513 09/29/19 0411 09/30/19 0810  WBC 3.4* 3.7* 4.2 5.1  HGB 7.9* 7.5* 8.2* 7.7*  HCT 25.8* 24.7* 27.1* 25.5*  MCV 92.8 93.9 94.1 91.7  PLT 75* 74* 55* 58*    Basic Metabolic Panel: Recent Labs  Lab 09/27/19 0322 09/28/19 0513 09/29/19 0411 09/30/19 0810 10/01/19 1702  NA 146* 141 136 133* 137  K 2.9* 2.6* 3.2* 3.2* 3.5  CL 117* 113* 108 103 107  CO2 20* 20* 21* 22 22  GLUCOSE 125* 110* 148* 211* 204*  BUN 24* 18 11 9 10   CREATININE 1.61* 1.62* 1.39* 1.23 1.07  CALCIUM 10.2 9.6 8.7* 8.2* 8.4*  MG  --   --  2.1  --   --      Liver Function Tests: Recent Labs  Lab 09/27/19 0322 10/01/19 1702  AST 98* 66*  ALT 54* 43  ALKPHOS 144* 157*  BILITOT 1.4* 1.0  PROT 8.1 8.7*  ALBUMIN 1.4* 1.3*        DVT prophylaxis: SCDs, no anticoagulation due to GI bleed.  Code Status: DNR  Family Communication: No family at bedside    Status is: Inpatient  Dispo: The patient is from: Skilled nursing facility              Anticipated d/c is to: Skilled nursing facility              Anticipated d/c date is: 10/06/2019              Patient currently not medically stable for discharge  Barrier to discharge-awaiting family discussion with palliative care to clarify goals of care.        Scheduled medications:  . Chlorhexidine Gluconate Cloth  6 each Topical Daily  . feeding supplement (PRO-STAT SUGAR FREE 64)  30 mL Per Tube BID  . ferrous sulfate  15 mg of iron Per Tube BID  . insulin aspart  0-9 Units Subcutaneous Q4H  . pantoprazole (PROTONIX) IV  40 mg Intravenous Q12H  . rifaximin  550 mg Per Tube BID  . tamsulosin  0.4 mg Oral Daily  . thiamine  100 mg Per Tube Daily    Consultants:  Perative care  Procedures:  None  Antibiotics:   Anti-infectives (From admission, onward)   Start     Dose/Rate Route Frequency Ordered Stop   09/29/19 1115  rifaximin (XIFAXAN)  tablet 550 mg     Discontinue     550 mg Per Tube 2 times daily 09/29/19 1100     09/29/19 1000  rifaximin (XIFAXAN) tablet 550 mg  Status:  Discontinued        550 mg Oral 2 times daily 09/29/19 0917 09/29/19 1100   09/26/19 1800  piperacillin-tazobactam (ZOSYN) IVPB 3.375 g        3.375 g 12.5 mL/hr over 240 Minutes Intravenous Every 8 hours 09/26/19 1405 10/02/19 2128   09/24/19 0630  ceFEPIme (MAXIPIME) 2 g in sodium chloride 0.9 % 100 mL IVPB  Status:  Discontinued        2 g 200 mL/hr over 30 Minutes Intravenous Every 12 hours 09/24/19 0623 09/26/19 1405       Objective   Vitals:   10/02/19 2009  10/02/19 2323 10/03/19 0355 10/03/19 0748  BP: (!) 119/55 132/64 124/75 131/74  Pulse: 94 77 74   Resp: 17 18 15 14   Temp: 98.1 F (36.7 C) 98.1 F (36.7 C) 97.6 F (36.4 C) (!) 97.4 F (36.3 C)  TempSrc: Oral Oral Oral Oral  SpO2: 96% 98% 97% 100%  Weight:        Intake/Output Summary (Last 24 hours) at 10/03/2019 1459 Last data filed at 10/03/2019 0955 Gross per 24 hour  Intake 3035.27 ml  Output 3400 ml  Net -364.73 ml    07/07 1901 - 07/09 0700 In: 3035.3 [I.V.:2835.3] Out: 7209 [Urine:5350]  Filed Weights   09/28/19 0434 10/01/19 0443 10/02/19 0323  Weight: 74.2 kg 98 kg 99 kg    Physical Examination:   General-lethargic  Heart-S1-S2, regular, no murmur auscultated  Lungs-clear to auscultation bilaterally, no wheezing or crackles auscultated  Abdomen-soft, nontender, no organomegaly  Extremities-no edema in the lower extremities  Neuro-alert, communicating, answer questions appropriately    Data Reviewed:   Recent Results (from the past 240 hour(s))  Urine Culture     Status: Abnormal   Collection Time: 09/23/19 11:48 PM   Specimen: Urine, Random  Result Value Ref Range Status   Specimen Description URINE, RANDOM  Final   Special Requests   Final    NONE Performed at [facility], [address] [place], [place] [address]    Culture  (A)  Final    >=100,000 COLONIES/mL CITROBACTER FREUNDII >=100,000 COLONIES/mL ENTEROCOCCUS FAECALIS    Report Status 09/28/2019 FINAL  Final   Organism ID, Bacteria CITROBACTER FREUNDII (A)  Final   Organism ID, Bacteria ENTEROCOCCUS FAECALIS (A)  Final      Susceptibility   Citrobacter freundii - MIC*    CEFAZOLIN >=64 RESISTANT Resistant     CEFTRIAXONE <=0.25 SENSITIVE Sensitive     CIPROFLOXACIN <=0.25 SENSITIVE Sensitive     GENTAMICIN <=1 SENSITIVE Sensitive     IMIPENEM <=0.25 SENSITIVE Sensitive     NITROFURANTOIN <=16 SENSITIVE Sensitive     TRIMETH/SULFA <=20 SENSITIVE Sensitive     PIP/TAZO <=4 SENSITIVE Sensitive     * >=100,000 COLONIES/mL CITROBACTER FREUNDII   Enterococcus faecalis - MIC*    AMPICILLIN <=2 SENSITIVE Sensitive     NITROFURANTOIN <=16 SENSITIVE Sensitive     VANCOMYCIN 1 SENSITIVE Sensitive     * >=100,000 COLONIES/mL ENTEROCOCCUS FAECALIS  SARS Coronavirus 2 by RT PCR (hospital order, performed in [place] hospital lab) Nasopharyngeal Nasopharyngeal Swab     Status: None   Collection Time: 09/24/19 12:56 AM   Specimen: Nasopharyngeal Swab  Result Value Ref Range Status   SARS Coronavirus 2 NEGATIVE NEGATIVE Final    Comment: (NOTE) SARS-CoV-2 target nucleic acids are NOT DETECTED.  The SARS-CoV-2 RNA is generally detectable in upper and lower respiratory specimens during the acute phase of infection. The lowest concentration of SARS-CoV-2 viral copies this assay can detect is 250 copies / mL. A negative result does not preclude SARS-CoV-2 infection and should not be used as the sole basis for treatment or other patient management decisions.  A negative result may occur with improper specimen collection / handling, submission of specimen other than nasopharyngeal swab, presence of viral mutation(s) within the areas targeted by this assay, and inadequate number of viral copies (<250 copies / mL). A negative result must be combined with  clinical observations, patient history, and epidemiological information.  Fact Sheet for Patients:   StrictlyIdeas.no  Fact Sheet  for Healthcare Providers: BankingDealers.co.za  This test is not yet approved or  cleared by the [place] and has been authorized for detection and/or diagnosis of SARS-CoV-2 by FDA under an Emergency Use Authorization (EUA).  This EUA will remain in effect (meaning this test can be used) for the duration of the COVID-19 declaration under Section 564(b)(1) of the Act, 21 U.S.C. section 360bbb-3(b)(1), unless the authorization is terminated or revoked sooner.  Performed at [facility], [place] [address], [place], [place] [address]   Culture, blood (routine x 2)     Status: None   Collection Time: 09/24/19  7:12 AM   Specimen: BLOOD LEFT FOREARM  Result Value Ref Range Status   Specimen Description BLOOD LEFT FOREARM  Final   Special Requests   Final    BOTTLES DRAWN AEROBIC ONLY Blood Culture adequate volume   Culture   Final    NO GROWTH 5 DAYS Performed at [facility], [place] [address], [place], [place] [address]    Report Status 09/29/2019 FINAL  Final  Culture, blood (routine x 2)     Status: None   Collection Time: 09/24/19  8:22 AM   Specimen: BLOOD  Result Value Ref Range Status   Specimen Description BLOOD SITE NOT SPECIFIED  Final   Special Requests   Final    BOTTLES DRAWN AEROBIC AND ANAEROBIC Blood Culture results may not be optimal due to an excessive volume of blood received in culture bottles   Culture   Final    NO GROWTH 5 DAYS Performed at [facility], [place] [address], [place], [place] [address]    Report Status 09/29/2019 FINAL  Final    No results for input(s): LIPASE, AMYLASE in the last 168 hours. Recent Labs  Lab 09/27/19 0322 09/28/19 0513 09/29/19 1027 09/30/19 0810  AMMONIA 61* 49* 31 53*    Cardiac Enzymes: No results for input(s):  CKTOTAL, CKMB, CKMBINDEX, TROPONINI in the last 168 hours. BNP (last 3 results) Recent Labs    09/24/19 0559  BNP 550.9*    ProBNP (last 3 results) No results for input(s): PROBNP in the last 8760 hours.  Studies:  No results found.     Oswald Hillock   [facility] If 7PM-7AM, please contact night-coverage at [email or web address], Office  [phone]   10/03/2019, 2:59 PM  LOS: 9 days

## 2019-10-03 NOTE — Progress Notes (Signed)
  Speech Language Pathology Treatment: Dysphagia  Patient Details Name: [name] MRN: [id number] DOB: [birth year] Today's Date: 10/03/2019 Time: 4401-0272 SLP Time Calculation (min) (ACUTE ONLY): 16 min  Assessment / Plan / Recommendation Clinical Impression  Pt was seen for dysphagia treatment with his daughter, [name], present for part of the session. Pt was alert and more conversant than during prior sessions. He briefly allowed oral inspection which revealed dried mucous on the palate and an edentulous status. Oral care was attempted but pt was resistant to this. Following education regarding the purpose of the session and need for him to have p.o. trials, he stated, "I have to have something". SLP agreed with the pt and he accepted the initial puree bolus with encouragement. Mild lingual residue was noted but cleared with subsequent swallows. Pt subsequently pursed his lips and required moderate encouragement to open his mouth and to accept boluses. However, even with encouragement, he only accepted very small boluses and ultimately refused to open his mouth for additional boluses. Coughing was noted with puree solids, suggesting aspiration. A modified barium swallow study would be beneficial to assess pt's physiology; however, considering his participation at bedside and cooperation with p.o. intake, his ability/willingness to complete a swallow study is questioned. [name] was educated regarding the pt's performance since the initial evaluation and she verbalized understanding. SLP will continue to follow pt pending family's goals of care.     HPI HPI: Pt is an 79 y.o. male with medical history significant of paroxysmal A. fib on low-dose aspirin but not anticoagulation due to chronic anemia and history of GI bleed, CAD, COPD, CVA, non-insulin-dependent type 2 diabetes, chronic diastolic CHF, hypertension, hyperlipidemia, GERD, history of gastric ulcer, history of urinary retention with a  chronic indwelling Foley catheter who presented to the ED from his rehab facility for evaluation of generalized weakness and anemia. CT head 6/30 negative for acute changes. CXR 6/30: Progressive central vascular congestion without overt edema. EEG: moderate diffuse slowing.  Findings are suggestive of an underlying encephalopathy possibly related to toxic, metabolic conditions, versus diffuse structural brain abnormalities.      SLP Plan  Continue with current plan of care       Recommendations  Diet recommendations: NPO Medication Administration: Via alternative means                Oral Care Recommendations: Oral care QID Follow up Recommendations: Other (comment) (TBD) SLP Visit Diagnosis: Dysphagia, oropharyngeal phase (R13.12) Plan: Continue with current plan of care       Jaray Boliver I. Hardin Negus, [place], [place] Office number [phone] Pager [place] 10/03/2019, 5:19 PM

## 2019-10-03 NOTE — Progress Notes (Signed)
Physical Therapy Treatment Patient Details Name: [name] MRN: [id number] DOB: [birth year] Today's Date: 10/03/2019    History of Present Illness Pt is an 79 y.o. male admitted from [facility] SNF on 09/23/19 with generalized weakness. Workup for acute metabolic encephalopathy, anemia, metabolic acidosis, sepsis secondary to CAUTI. PMH includes anemia, atrial flutter/atrial fibrillation, chronic diastolic HF, COPD, F0XN, nonhemorrhagic CVA, chronic indwelling foley catheter.    PT Comments    Pt supine on arrival able to state his name as "capp". Directed pt to squeeze therapist hands bil with no response and then when assisted to lift arms he stated "I can't" then assisted with moving bil UE. Pt with limited bil knee and hip motion with grimace and resistance to further movement. Pt very resistant to rolling attempts this session and unable to even begin to progress toward sitting. Noted pt was walking in May and reports he wants to move more when asked but appears to lack awareness of mobility needed at bed level to progress toward gait with very limited activity tolerance. Will continue to follow for trial through goal date but ultimately believe pt will need therapy deferral to SNF without significant acute participation.     Follow Up Recommendations  SNF     Equipment Recommendations  None recommended by PT    Recommendations for Other Services       Precautions / Restrictions Precautions Precautions: Fall;Other (comment) Precaution Comments: cortrak, catheter, flexiseal    Mobility  Bed Mobility               General bed mobility comments: attempted bil rolling with 1 person assist but pt resisting and unable to get pt to move at all with knee flexion and crossbody reach. Total assist to slide to [facility] and reposition in chair position  Transfers                 General transfer comment: unable to attempt - will require maximove lift transfer  OOB  Ambulation/Gait                 Stairs             Wheelchair Mobility    Modified Rankin (Stroke Patients Only)       Balance                                            Cognition Arousal/Alertness: Awake/alert Behavior During Therapy: Flat affect Overall Cognitive Status: Difficult to assess                                 General Comments: pt stating name but then will state " I can't do that" and then proceed to assist at times with HEP. Resitant to bed mobility      Exercises General Exercises - Upper Extremity Shoulder Flexion: AAROM;Both;Supine;10 reps General Exercises - Lower Extremity Heel Slides: AAROM;Both;Supine;10 reps Hip ABduction/ADduction: PROM;Both;Supine;10 reps Hip Flexion/Marching: AAROM;Both;Supine;10 reps    General Comments        Pertinent Vitals/Pain Pain Score: 5  Pain Location: bil LE with full ROM Pain Descriptors / Indicators: Grimacing;Moaning Pain Intervention(s): Limited activity within patient's tolerance;Monitored during session;Repositioned    Home Living  Prior Function            PT Goals (current goals can now be found in the care plan section) Progress towards PT goals: Not progressing toward goals - comment    Frequency    Min 2X/week      PT Plan Current plan remains appropriate    Co-evaluation              AM-PAC PT "6 Clicks" Mobility   Outcome Measure  Help needed turning from your back to your side while in a flat bed without using bedrails?: Total Help needed moving from lying on your back to sitting on the side of a flat bed without using bedrails?: Total Help needed moving to and from a bed to a chair (including a wheelchair)?: Total Help needed standing up from a chair using your arms (e.g., wheelchair or bedside chair)?: Total Help needed to walk in hospital room?: Total Help needed climbing 3-5 steps with a  railing? : Total 6 Click Score: 6    End of Session   Activity Tolerance: Patient tolerated treatment well Patient left: in bed;with call bell/phone within reach;with bed alarm set Nurse Communication: Mobility status;Need for lift equipment PT Visit Diagnosis: Muscle weakness (generalized) (M62.81);Pain;Other abnormalities of gait and mobility (R26.89)     Time: 6720-9470 PT Time Calculation (min) (ACUTE ONLY): 21 min  Charges:  $Therapeutic Activity: 8-22 mins                     Nethra Mehlberg P, PT Acute Rehabilitation Services Pager: [phone] Office: [place] 10/03/2019, 1:38 PM

## 2019-10-03 NOTE — Progress Notes (Signed)
° °  Palliative Medicine Inpatient Follow Up Note   I spoke to [facility] this afternoon. He is aware that his father has had six days of supplemental nutrition. I shared that it is important that we meet again to further discuss goals as the NGT is not a long term solution.   Plan for palliative care meeting tomorrow with patients family.   Time Spent: No Charge  ______________________________________________________________________________________ [place] Team Team Cell Phone: [phone] Please utilize secure chat with additional questions, if there is no response within 30 minutes please call the above phone number  Palliative Medicine Team providers are available by phone from 7am to 7pm daily and can be reached through the team cell phone.  Should this patient require assistance outside of these hours, please call the patient's attending physician.

## 2019-10-04 LAB — GLUCOSE, CAPILLARY
Glucose-Capillary: 154 mg/dL — ABNORMAL HIGH (ref 70–99)
Glucose-Capillary: 157 mg/dL — ABNORMAL HIGH (ref 70–99)
Glucose-Capillary: 173 mg/dL — ABNORMAL HIGH (ref 70–99)
Glucose-Capillary: 177 mg/dL — ABNORMAL HIGH (ref 70–99)
Glucose-Capillary: 183 mg/dL — ABNORMAL HIGH (ref 70–99)
Glucose-Capillary: 195 mg/dL — ABNORMAL HIGH (ref 70–99)
Glucose-Capillary: 198 mg/dL — ABNORMAL HIGH (ref 70–99)

## 2019-10-04 NOTE — Progress Notes (Addendum)
Palliative Medicine Inpatient Consult Note  Reason for consult:Goals Of Care  HPI: Per intake H&P -->Trevor Powellis a 79 y.o.malewith medical history significant ofparoxysmal A. fib on low-dose aspirin but not anticoagulation due to chronic anemia and history of GI bleed, CAD, COPD, CVA, non-insulin-dependent type 2 diabetes, chronic diastolic CHF, hypertension, hyperlipidemia, GERD, history of gastric ulcer, history of urinary retention with a chronic indwelling Foley catheter presenting to the ED from his rehab facility for evaluation of generalized weakness and anemia. Labs drawn at the facility revealed worsening anemia with hemoglobin 6.2.  Palliative care was asked to aid in goals of care conversations.  Today's Discussion (10/04/2019): Chart reviewed. I met with [name] (daughter),[name] (daughter) [name] (daughter), [name] (daughters boyfriend), [name] (sons wife), and [name].   I again, introduced Palliative Medicine as specialized medical care for people living with serious illness. It focuses on providing relief from the symptoms and stress of a serious illness. The goal is to improve quality of life for both the patient and the family.  I shared with [name]'s family that over the last week he has not made great improvements. We discussed how he has been hospitalized for the past ten days and he has been on artificial nutrition for the last six days. He has not shown great strides despite all interventions. He has remained somnolent throughout his hospitalization with short episodes of arousal.   We discussed [name]'s ongoing aspiration risk with our without tube feeding given that we naturally produce saliva which can travel into our lungs irregardless. He at this time is unable to appropriately and safely swallow per review of evaluations.   In terms of mobility [name] has been seen by PT/OT though has been able to do very little. We discussed the  loss of muscle mass per day when hospitalization and how devastating this can be in the long run for geriatric patients.   Dr. Darrick Meigs entered the meeting around 245PM and offered that he feels we have done all that can be done from a medical perspective. He feels that it would be reasonable to consider making [name] comfortable and allowing nature to take its course. The family requested information on what makes Dr. Darrick Meigs feel this way. He was able to answer these questions. [name] was reasonable tearful during this conversation.  His family requested some time to deliberate about what their next steps would be. They expressed interest in [name] traveling 6 hours to [place] we discussed how this could be very trial some for him in his present condition. We discussed how that could also altered his medical coverage and if they wanted him transported via ambulance how this would impact them. Ultimately, it was decided that the patient would be best if kept in the [place] area as it is most central to all family members.  I explained hospice as a service for patients who have a prognosis of < 6 months to live. We discussed how [name] had not been eating or drinking therefore once the NGT is removed his course will likely be very short. The family asked me what the alternatives would be. I told them that if he were to go to a SNF I am not sure he would fair well given his current state and inability to follow directions well. I strongly suggested transitioning to comfort care and hospice.  I allowed the family over forty minutes to discuss this.  When I returned they asked more questions and finally determined that they  will not be prepared to make a decision until tomorrow. I encouraged them to think about it thoroughly as I am concerned the longer we wait the harder it will be. They stated that they will have a determination by tomorrow.  Discussed with [name] the importance of continued conversation with  family and the medical providers regarding overall plan of care and treatment options, ensuring decisions are within the context of the patient's values and GOCs.    Patients bedside RN, [name] updated on the plan.   SUMMARY OF RECOMMENDATIONS DNAR/DNI  Continue current scope of care  Family is deciding on if they would like to pursue comfort care  This conversation/these recommendations were discussed with patient primary care team, Dr. Darrick Meigs  Time In: 1400 Time Out: 1630 Total Time: 150 Greater than 50%  of this time was spent counseling and coordinating care related to the above assessment and plan.  [place] Team Team Cell Phone: [phone] Please utilize secure chat with additional questions, if there is no response within 30 minutes please call the above phone number  Palliative Medicine Team providers are available by phone from 7am to 7pm daily and can be reached through the team cell phone.  Should this patient require assistance outside of these hours, please call the patient's attending physician.

## 2019-10-04 NOTE — Progress Notes (Signed)
[facility]  PROGRESS NOTE  [name] MRN:[id number] DOB: 10/17/1940 DOA: 09/23/2019 PCP: Wallace, Catherine Lauren, DO   Brief HPI:   79-year-old male with medical history of paroxysmal atrial fibrillation on low-dose aspirin, not on anticoagulation due to chronic anemia and history of GI bleed, CAD, COPD, CVA, diabetes mellitus type 2, chronic diastolic heart failure, GERD, history of gastric ulcer, urine retention with chronic indwelling Foley catheter who presented from rehab for evaluation of generalized weakness and anemia.  Labs done at the facility showed hemoglobin of 6.2.  Patient was very somnolent and difficult to arouse on admission.  He was admitted with diagnosis of acute metabolic encephalopathy, anemia, metabolic acidosis, hyperammonemia.  Patient continues to be lethargic he was evaluated by CCM, with CCM recommending no need for intubation.  Neurology consulted and altered mental status thought to be due to metabolic encephalopathy.  Perative care was consulted, CODE STATUS was changed to DNR.  Plan for trial of feeding tube for 72 hours.    Subjective   Patient seen, continues to lethargic.  Getting core track tube feeding.   Assessment/Plan:     1. Acute blood loss anemia-secondary to GI bleed.  Patient presents with hemoglobin of 6.2, baseline hemoglobin of around 8-9.  Patient received 2 units PRBC.  Hemoglobin is stable at 7.7.  No evidence of active GI bleed.  Continue to monitor hemoglobin and transfuse for hemoglobin less than 7.0.  GI evaluation done, not a candidate for endoscopy.  Continue IV Protonix. 2. Chest pain-EKG showed atrial fibrillation, will obtain troponin x2.  He is not a candidate for aggressive cardiac intervention. 3. Acute metabolic encephalopathy-neurology saw the patient and deemed that he has acute metabolic encephalopathy.  ABG showed pH with respiratory callosal and metabolic acidosis.  EEG negative for seizure.  MRI could not be  done due to pacemaker. 4. Hyperammonemia-ammonia was elevated, 88 has come down to 53.  Likely from GI bleed.  Started on rifaximin.  Lactulose was held due to diarrhea.  Right upper quadrant ultrasound showed no cirrhosis.  S/p cholecystectomy. 5. Transaminitis-right upper quadrant ultrasound unremarkable.  No abnormality seen on liver.  Albumin 1.4, AST 98, ALT 54, alk phos 144, total bili 1.4.  Likely hypotension induced ischemic liver injury.  Follow LFTs in a.m. 6. Sepsis due to CAUTI-patient has chronic indwelling Foley catheter due to urinary retention.  UA showed more than 50 WBCs per high-power field.  IV antibiotics were changed to Zosyn.  Continue IV Zosyn. 7. Nutrition-family and palliative care discussed feeding trial for 72 hours.  Tube feeding was started on 09/28/2019.  72 hours have been completed.  Palliative care discussed with daughter and she wants to discuss with other family members before making decision regarding comfort measures.  Patient's son is arriving tomorrow.  Will discuss with patient's son and palliative care regarding further clarification of goals of care. 8. Paroxysmal atrial fibrillation-aspirin hold due to GI bleed. 9. Acute on chronic kidney disease stage IIIb-prior creatinine 1.4-1.5.  Presented with creatinine 1.8, which improved with IV fluids.  Creatinine is down to 1.07 10. Diabetes mellitus type 2-continue sliding scale insulin with NovoLog. 11. Hypokalemia-replete 12. Metabolic acidosis-likely from sepsis, AKI.  Improved with IV bicarb GTT. 13. Goals of care-patient has very poor prognosis.  Palliative care was consulted for discussion of goals of care with the patient's family.  Patient's son is arriving today and will have a meeting with palliative care and myself at 2:30 PM.    SpO2: 97 %   O2 Flow Rate (L/min): 1 L/min   COVID-19 Labs  No results for input(s): DDIMER, FERRITIN, LDH, CRP in the last 72 hours.  Lab Results  Component Value Date    SARSCOV2NAA NEGATIVE 09/24/2019   SARSCOV2NAA NEGATIVE 08/03/2019   SARSCOV2NAA NEGATIVE 08/01/2019   SARSCOV2NAA NEGATIVE 07/28/2019     CBG: Recent Labs  Lab 10/03/19 1720 10/03/19 2031 10/04/19 0012 10/04/19 0403 10/04/19 0750  GLUCAP 183* 183* 157* 154* 198*    CBC: Recent Labs  Lab 09/28/19 0513 09/29/19 0411 09/30/19 0810  WBC 3.7* 4.2 5.1  HGB 7.5* 8.2* 7.7*  HCT 24.7* 27.1* 25.5*  MCV 93.9 94.1 91.7  PLT 74* 55* 58*    Basic Metabolic Panel: Recent Labs  Lab 09/28/19 0513 09/29/19 0411 09/30/19 0810 10/01/19 1702  NA 141 136 133* 137  K 2.6* 3.2* 3.2* 3.5  CL 113* 108 103 107  CO2 20* 21* 22 22  GLUCOSE 110* 148* 211* 204*  BUN 18 11 9 10  CREATININE 1.62* 1.39* 1.23 1.07  CALCIUM 9.6 8.7* 8.2* 8.4*  MG  --  2.1  --   --      Liver Function Tests: Recent Labs  Lab 10/01/19 1702  AST 66*  ALT 43  ALKPHOS 157*  BILITOT 1.0  PROT 8.7*  ALBUMIN 1.3*        DVT prophylaxis: SCDs, no anticoagulation due to GI bleed.  Code Status: DNR  Family Communication: No family at bedside    Status is: Inpatient  Dispo: The patient is from: Skilled nursing facility              Anticipated d/c is to: Skilled nursing facility              Anticipated d/c date is: 10/06/2019              Patient currently not medically stable for discharge  Barrier to discharge-awaiting family discussion with palliative care to clarify goals of care.        Scheduled medications:  . Chlorhexidine Gluconate Cloth  6 each Topical Daily  . feeding supplement (PRO-STAT SUGAR FREE 64)  30 mL Per Tube BID  . ferrous sulfate  15 mg of iron Per Tube BID  . insulin aspart  0-9 Units Subcutaneous Q4H  . pantoprazole (PROTONIX) IV  40 mg Intravenous Q12H  . rifaximin  550 mg Per Tube BID  . tamsulosin  0.4 mg Oral Daily  . thiamine  100 mg Per Tube Daily    Consultants:  Perative care  Procedures:  None  Antibiotics:   Anti-infectives (From  admission, onward)   Start     Dose/Rate Route Frequency Ordered Stop   09/29/19 1115  rifaximin (XIFAXAN) tablet 550 mg     Discontinue     550 mg Per Tube 2 times daily 09/29/19 1100     09/29/19 1000  rifaximin (XIFAXAN) tablet 550 mg  Status:  Discontinued        550 mg Oral 2 times daily 09/29/19 0917 09/29/19 1100   09/26/19 1800  piperacillin-tazobactam (ZOSYN) IVPB 3.375 g        3.375 g 12.5 mL/hr over 240 Minutes Intravenous Every 8 hours 09/26/19 1405 10/02/19 2128   09/24/19 0630  ceFEPIme (MAXIPIME) 2 g in sodium chloride 0.9 % 100 mL IVPB  Status:  Discontinued        2 g 200 mL/hr over 30 Minutes Intravenous Every 12 hours 09/24/19 0623 09/26/19 1405         Objective   Vitals:   10/04/19 0024 10/04/19 0327 10/04/19 0700 10/04/19 0755  BP: (!) 143/81 117/70  128/66  Pulse: 97 92  83  Resp: 16 14  17  Temp: 97.9 F (36.6 C) 98.4 F (36.9 C)  (!) 97.5 F (36.4 C)  TempSrc: Oral Oral  Oral  SpO2: 95% 95%  97%  Weight:   102.5 kg   Height:   5' 6" (1.676 m)     Intake/Output Summary (Last 24 hours) at 10/04/2019 1116 Last data filed at 10/04/2019 1000 Gross per 24 hour  Intake --  Output 3500 ml  Net -3500 ml    07/08 1901 - 07/10 0700 In: 3035.3 [I.V.:2835.3] Out: 5600 [Urine:5600]  Filed Weights   10/01/19 0443 10/02/19 0323 10/04/19 0700  Weight: 98 kg 99 kg 102.5 kg    Physical Examination:  General-appears lethargic Heart-S1-S2, regular, no murmur auscultated Lungs-clear to auscultation bilaterally, no wheezing or crackles auscultated Abdomen-soft, nontender, no organomegaly Extremities-no edema in the lower extremities Neuro-lethargic, not following commands   Data Reviewed:   No results found for this or any previous visit (from the past 240 hour(s)).  No results for input(s): LIPASE, AMYLASE in the last 168 hours. Recent Labs  Lab 09/28/19 0513 09/29/19 1027 09/30/19 0810  AMMONIA 49* 31 53*    Cardiac Enzymes: No results for  input(s): CKTOTAL, CKMB, CKMBINDEX, TROPONINI in the last 168 hours. BNP (last 3 results) Recent Labs    09/24/19 0559  BNP 550.9*    ProBNP (last 3 results) No results for input(s): PROBNP in the last 8760 hours.  Studies:  No results found.      S    [facility] If 7PM-7AM, please contact night-coverage at [email or web address], Office  [phone]   10/04/2019, 11:16 AM  LOS: 10 days             

## 2019-10-05 LAB — GLUCOSE, CAPILLARY
Glucose-Capillary: 175 mg/dL — ABNORMAL HIGH (ref 70–99)
Glucose-Capillary: 111 mg/dL — ABNORMAL HIGH (ref 70–99)
Glucose-Capillary: 190 mg/dL — ABNORMAL HIGH (ref 70–99)
Glucose-Capillary: 207 mg/dL — ABNORMAL HIGH (ref 70–99)

## 2019-10-05 MED ORDER — LORAZEPAM 2 MG/ML IJ SOLN
1.0000 mg | INTRAMUSCULAR | Status: DC | PRN
  Administered 2019-10-06: 1 mg via INTRAVENOUS
  Filled 2019-10-05: qty 1

## 2019-10-05 MED ORDER — GLYCOPYRROLATE 0.2 MG/ML IJ SOLN
0.4000 mg | INTRAMUSCULAR | Status: DC
  Administered 2019-10-05 – 2019-10-09 (×24): 0.4 mg via INTRAVENOUS
  Filled 2019-10-05 (×25): qty 2

## 2019-10-05 MED ORDER — BIOTENE DRY MOUTH MT LIQD: 15.0000 mL | OROMUCOSAL | Status: DC | PRN

## 2019-10-05 MED ORDER — ONDANSETRON HCL 4 MG/2ML IJ SOLN: 4.0000 mg | Freq: Four times a day (QID) | INTRAMUSCULAR | Status: DC | PRN

## 2019-10-05 MED ORDER — POLYVINYL ALCOHOL 1.4 % OP SOLN
1.0000 [drp] | Freq: Four times a day (QID) | OPHTHALMIC | Status: DC | PRN
  Filled 2019-10-05: qty 15

## 2019-10-05 MED ORDER — MORPHINE SULFATE (PF) 2 MG/ML IV SOLN
1.0000 mg | INTRAVENOUS | Status: DC | PRN
  Administered 2019-10-05 (×2): 2 mg via INTRAVENOUS
  Administered 2019-10-06: 1 mg via INTRAVENOUS
  Administered 2019-10-08: 2 mg via INTRAVENOUS
  Filled 2019-10-05 (×4): qty 1

## 2019-10-05 MED ORDER — MORPHINE SULFATE (PF) 2 MG/ML IV SOLN
1.0000 mg | Freq: Three times a day (TID) | INTRAVENOUS | Status: DC
  Administered 2019-10-05 – 2019-10-09 (×12): 1 mg via INTRAVENOUS
  Filled 2019-10-05 (×12): qty 1

## 2019-10-05 MED ORDER — MORPHINE SULFATE 10 MG/5ML PO SOLN: 2.5000 mg | ORAL | Status: DC | PRN

## 2019-10-05 MED ORDER — ONDANSETRON 4 MG PO TBDP: 4.0000 mg | ORAL_TABLET | Freq: Four times a day (QID) | ORAL | Status: DC | PRN

## 2019-10-05 MED ORDER — LORAZEPAM 2 MG/ML PO CONC: 1.0000 mg | ORAL | Status: DC | PRN

## 2019-10-05 NOTE — Progress Notes (Signed)
Engineer, maintenance [facility]) Hospital Liaison note.     Received request from [place], [place] for family interest in [facility]. [facility] is unable to offer a room today. Hospital Liaison will follow up tomorrow or sooner if a room becomes available.     A Please do not hesitate to call with questions.     Thank you,    Farrel Gordon, RN, [facility]       [place] (listed on AMION under [place])     [phone]

## 2019-10-05 NOTE — Progress Notes (Signed)
Pt placed on comfort care per MD order and CCMD notified. Pt's NG tube removed per MD order. Pt tolerated we with no complications.  [employer] contacted about turning off pacemaker per MD order.  Spoke with [employer] representative Dondra Prader and she stated that a rep. will come tomorrow (10/06/2019) to turn off pacemaker.

## 2019-10-05 NOTE — Progress Notes (Signed)
  Palliative Medicine Inpatient Consult Note  Reason for consult:Goals Of Care  HPI: Per intake H&P -->[name] a 79 y.o.malewith medical history significant ofparoxysmal A. fib on low-dose aspirin but not anticoagulation due to chronic anemia and history of GI bleed, CAD, COPD, CVA, non-insulin-dependent type 2 diabetes, chronic diastolic CHF, hypertension, hyperlipidemia, GERD, history of gastric ulcer, history of urinary retention with a chronic indwelling Foley catheter presenting to the ED from his rehab facility for evaluation of generalized weakness and anemia. Labs drawn at the facility revealed worsening anemia with hemoglobin 6.2.  Palliative care was asked to aid in goals of care conversations.  Today's Discussion (10/05/2019): Chart reviewed. I met with Sharolyn Douglas (daughter) at bedside. She shares that she and her family have decided to keep [name] comfortable and moved forward with the formal orders for comfort oriented care. We talked about transition to comfort measures in house and what that would entail inclusive of medications to control pain, dyspnea, agitation, nausea, itching, and hiccups.  We discussed stopping all uneccessary measures such as NGT feedings, blood draws, needle sticks, and frequent vital signs. Utilized reflective listening throughout our time together.   [name] and her family had requested to keep fluids going at 34m/hr until patient transitions to a residential hospice home. She and her family stated it would make them feel that he was not "starvig to death". I shared that we can accommodate that here but he would not receive fluids upon transfer. She vocalized being okay with this.  Reviewed in detail all medications patient would and would not receive.  We further discussed patients overall clinical course. Offered support through silence and therapeutic listening.    Patients bedside RN, DShanon Browupdated on the plan.   SUMMARY OF  RECOMMENDATIONS DNAR/DNI  Transition to comfort care  TOC --> Request a bed at [facility]idential [facility]  Symptoms Management: Dyspnea: Pain:  - Morphine 2.5- '5mg'$  PO/SL Q1H PRN  - Morphine 1-'2mg'$  IV Q1H PRN  - Morphine '1mg'$  IV TID ATC Fever:  - Tylenol '650mg'$  PO/PR Q6H PRN Agitation: Anxiety:  - Lorazepam '1mg'$  PO/SL/IV  Q1H PRN Nausea:  - Zofran '4mg'$  PO/IV Q6H PRN  Secretions:  - Glycopyrrolate 0.'4mg'$  IV Q4H ATC Dry Eyes:  - Artificial Tears PRN Xerostomia:  - Biotene 134mPRN  - BID oral care Urinary Retention:  - Maintain foley  Stool Incontinence:  - Maintain rectal tube Constipation:  - Bisacodyl '10mg'$  PR PRN QDay Spiritual:  - Chaplain consult  This conversation/these recommendations were discussed with patient primary care team, Dr. LaDarrick MeigsTotal Time: 4534reater than 50%  of this time was spent counseling and coordinating care related to the above assessment and plan.  [place] Team Cell Phone: [phone] utilize secure chat with additional questions, if there is no response within 30 minutes please call the above phone number  Palliative Medicine Team providers are available by phone from 7am to 7pm daily and can be reached through the team cell phone.  Should this patient require assistance outside of these hours, please call the patient's attending physician.

## 2019-10-05 NOTE — Progress Notes (Signed)
[facility]  PROGRESS NOTE  [name] KYH:[id number] DOB: 1940/06/18 DOA: 09/23/2019 PCP: Nicolette Bang, DO   Brief HPI:   79 year old male with medical history of paroxysmal atrial fibrillation on low-dose aspirin, not on anticoagulation due to chronic anemia and history of GI bleed, CAD, COPD, CVA, diabetes mellitus type 2, chronic diastolic heart failure, GERD, history of gastric ulcer, urine retention with chronic indwelling Foley catheter who presented from rehab for evaluation of generalized weakness and anemia.  Labs done at the facility showed hemoglobin of 6.2.  Patient was very somnolent and difficult to arouse on admission.  He was admitted with diagnosis of acute metabolic encephalopathy, anemia, metabolic acidosis, hyperammonemia.  Patient continues to be lethargic he was evaluated by CCM, with CCM recommending no need for intubation.  Neurology consulted and altered mental status thought to be due to metabolic encephalopathy.  Perative care was consulted, CODE STATUS was changed to DNR.  Plan for trial of feeding tube for 72 hours.    Subjective   Patient seen, continues to lethargic.  Family has decided for full comfort measures and transferred to residential hospice.   Assessment/Plan:     1. Acute blood loss anemia-secondary to GI bleed.  Patient presents with hemoglobin of 6.2, baseline hemoglobin of around 8-9.  Patient received 2 units PRBC.  Hemoglobin is stable at 7.7.  No evidence of active GI bleed.  Continue to monitor hemoglobin and transfuse for hemoglobin less than 7.0.  GI evaluation done, not a candidate for endoscopy.  Continue IV Protonix. 2. Chest pain-EKG showed atrial fibrillation, will obtain troponin x2.  He is not a candidate for aggressive cardiac intervention. 3. Acute metabolic encephalopathy-neurology saw the patient and deemed that he has acute metabolic encephalopathy.  ABG showed pH with respiratory callosal and metabolic  acidosis.  EEG negative for seizure.  MRI could not be done due to pacemaker. 4. Hyperammonemia-ammonia was elevated, 88 has come down to 53.  Likely from GI bleed.  Started on rifaximin.  Lactulose was held due to diarrhea.  Right upper quadrant ultrasound showed no cirrhosis.  S/p cholecystectomy. 5. Transaminitis-right upper quadrant ultrasound unremarkable.  No abnormality seen on liver.  Albumin 1.4, AST 98, ALT 54, alk phos 144, total bili 1.4.  Likely hypotension induced ischemic liver injury.  Follow LFTs in a.m. 6. Sepsis due to CAUTI-patient has chronic indwelling Foley catheter due to urinary retention.  UA showed more than 50 WBCs per high-power field.  IV antibiotics were changed to Zosyn.  Continue IV Zosyn. 7. Nutrition-family and palliative care discussed feeding trial for 72 hours.  Tube feeding was started on 09/28/2019.  72 hours have been completed.  Palliative care discussed with daughter and she wants to discuss with other family members before making decision regarding comfort measures.  Patient's son is arriving tomorrow.  Will discuss with patient's son and palliative care regarding further clarification of goals of care. 8. Paroxysmal atrial fibrillation-aspirin hold due to GI bleed. 9. Acute on chronic kidney disease stage IIIb-prior creatinine 1.4-1.5.  Presented with creatinine 1.8, which improved with IV fluids.  Creatinine is down to 1.07 10. Diabetes mellitus type 2-continue sliding scale insulin with NovoLog. 11. Hypokalemia-replete 12. Metabolic acidosis-likely from sepsis, AKI.  Improved with IV bicarb GTT. 13. Goals of care-patient has very poor prognosis.  Palliative care was consulted for discussion of goals of care with the patient's family.  I discussed with patient's family yesterday regarding patient's poor prognosis and options including residential hospice.  Family has chosen for residential hospice.    SpO2: 98 % O2 Flow Rate (L/min): 1 L/min   COVID-19  Labs  No results for input(s): DDIMER, FERRITIN, LDH, CRP in the last 72 hours.  Lab Results  Component Value Date   SARSCOV2NAA NEGATIVE 09/24/2019   [place] NEGATIVE 08/03/2019   [place] NEGATIVE 08/01/2019   [place] NEGATIVE 07/28/2019     CBG: Recent Labs  Lab 10/04/19 2007 10/05/19 0118 10/05/19 0417 10/05/19 0804 10/05/19 1133  GLUCAP 177* 175* 111* 190* 207*    CBC: Recent Labs  Lab 09/29/19 0411 09/30/19 0810  WBC 4.2 5.1  HGB 8.2* 7.7*  HCT 27.1* 25.5*  MCV 94.1 91.7  PLT 55* 58*    Basic Metabolic Panel: Recent Labs  Lab 09/29/19 0411 09/30/19 0810 10/01/19 1702  NA 136 133* 137  K 3.2* 3.2* 3.5  CL 108 103 107  CO2 21* 22 22  GLUCOSE 148* 211* 204*  BUN 11 9 10   CREATININE 1.39* 1.23 1.07  CALCIUM 8.7* 8.2* 8.4*  MG 2.1  --   --      Liver Function Tests: Recent Labs  Lab 10/01/19 1702  AST 66*  ALT 43  ALKPHOS 157*  BILITOT 1.0  PROT 8.7*  ALBUMIN 1.3*        DVT prophylaxis: SCDs, no anticoagulation due to GI bleed.  Code Status: DNR  Family Communication: No family at bedside    Status is: Inpatient  Dispo: The patient is from: Skilled nursing facility              Anticipated d/c is to: Skilled nursing facility              Anticipated d/c date is: 10/06/2019              Patient currently not medically stable for discharge  Barrier to discharge-awaiting family discussion with palliative care to clarify goals of care.        Scheduled medications:  . glycopyrrolate  0.4 mg Intravenous Q4H  .  morphine injection  1 mg Intravenous TID    Consultants:  Perative care  Procedures:  None  Antibiotics:   Anti-infectives (From admission, onward)   Start     Dose/Rate Route Frequency Ordered Stop   09/29/19 1115  rifaximin (XIFAXAN) tablet 550 mg  Status:  Discontinued        550 mg Per Tube 2 times daily 09/29/19 1100 10/05/19 1204   09/29/19 1000  rifaximin (XIFAXAN) tablet 550 mg   Status:  Discontinued        550 mg Oral 2 times daily 09/29/19 0917 09/29/19 1100   09/26/19 1800  piperacillin-tazobactam (ZOSYN) IVPB 3.375 g        3.375 g 12.5 mL/hr over 240 Minutes Intravenous Every 8 hours 09/26/19 1405 10/02/19 2128   09/24/19 0630  ceFEPIme (MAXIPIME) 2 g in sodium chloride 0.9 % 100 mL IVPB  Status:  Discontinued        2 g 200 mL/hr over 30 Minutes Intravenous Every 12 hours 09/24/19 0623 09/26/19 1405       Objective   Vitals:   10/05/19 0249 10/05/19 0415 10/05/19 0805 10/05/19 1135  BP:  131/62 (!) 107/50   Pulse:  79    Resp:  12    Temp:  97.6 F (36.4 C) 97.9 F (36.6 C) 98.5 F (36.9 C)  TempSrc:  Oral Oral Oral  SpO2:  98%    Weight: 100.5 kg  Height:        Intake/Output Summary (Last 24 hours) at 10/05/2019 1450 Last data filed at 10/05/2019 0500 Gross per 24 hour  Intake 2232 ml  Output 2600 ml  Net -368 ml    07/09 1901 - 07/11 0700 In: 2232 [I.V.:612] Out: 6100 [Urine:5500]  Filed Weights   10/02/19 0323 10/04/19 0700 10/05/19 0249  Weight: 99 kg 102.5 kg 100.5 kg    Physical Examination:  General-appears lethargic Heart-S1-S2, regular, no murmur auscultated Lungs-clear to auscultation bilaterally, no wheezing or crackles auscultated Abdomen-soft, nontender, no organomegaly Extremities-no edema in the lower extremities Neuro-lethargic, not following commands   Data Reviewed:   No results found for this or any previous visit (from the past 240 hour(s)).  No results for input(s): LIPASE, AMYLASE in the last 168 hours. Recent Labs  Lab 09/29/19 1027 09/30/19 0810  AMMONIA 31 53*    Cardiac Enzymes: No results for input(s): CKTOTAL, CKMB, CKMBINDEX, TROPONINI in the last 168 hours. BNP (last 3 results) Recent Labs    09/24/19 0559  BNP 550.9*    ProBNP (last 3 results) No results for input(s): PROBNP in the last 8760 hours.  Studies:  No results found.     Oswald Hillock   [facility] If 7PM-7AM, please contact night-coverage at [email or web address], Office  [phone]   10/05/2019, 2:50 PM  LOS: 11 days

## 2019-10-05 NOTE — Progress Notes (Signed)
[employer] representative called back and stated that they do not turn off permanent pacemakers only ICD's so they will no longer be coming tomorrow 10/06/2019.

## 2019-10-05 NOTE — TOC Progression Note (Signed)
Transition of Care [facility]) - Progression Note    Patient Details  Name: [name] MRN: [id number] Date of Birth: 1940/07/16  Transition of Care [facility] CM/SW [place], [place] Phone Number: [phone]  10/05/2019, 12:42 PM  Clinical Narrative:     CSW received a consult for residential hospice. CSW reach out to Bevely Palmer with [facility] and made a referral. CSW was informed that referral has been taken and she will reach out to family.   TOC team will continue to assist with discharge planning needs.   Expected Discharge Plan: Skilled Nursing Facility Barriers to Discharge: Continued Medical Work up  Expected Discharge Plan and Services Expected Discharge Plan: [place] In-house Referral: Clinical Social Work     Living arrangements for the past 2 months: [place]                                       Social Determinants of Health (SDOH) Interventions    Readmission Risk Interventions Readmission Risk Prevention Plan 08/05/2019  Transportation Screening Complete  PCP or Specialist Appt within 3-5 Days Complete  HRI or [place] Complete  Social Work Consult for Gap Planning/Counseling Complete  Palliative Care Screening Not Applicable  Medication Review Press photographer) Complete  Some recent data might be hidden

## 2019-10-06 DIAGNOSIS — R627 Adult failure to thrive: Secondary | ICD-10-CM

## 2019-10-06 NOTE — Progress Notes (Signed)
[facility] ([facility]) Hospital Liaison note.  [facility] is unable to offer a room today. Hospital Liaison will follow up tomorrow or sooner if a room becomes available.     A Please do not hesitate to call with questions.     Thank you,    Mary Anne Robertson, RN, CCM       [facility] Hospital Liaison (listed on AMION under Hospice /[facility])     [phone] 

## 2019-10-06 NOTE — Progress Notes (Signed)
Nutrition Brief Note  Chart reviewed. Pt now transitioning to comfort care.  No further nutrition interventions warranted at this time.  Please re-consult as needed.   Mariana Single RD, LDN Clinical Nutrition Pager listed in [place]

## 2019-10-06 NOTE — Progress Notes (Signed)
[facility]  PROGRESS NOTE  Lindwood Mogel ZYS:[id number] DOB: 13-Sep-1940 DOA: 09/23/2019 PCP: Nicolette Bang, DO   Brief HPI:   79 year old male with medical history of paroxysmal atrial fibrillation on low-dose aspirin, not on anticoagulation due to chronic anemia and history of GI bleed, CAD, COPD, CVA, diabetes mellitus type 2, chronic diastolic heart failure, GERD, history of gastric ulcer, urine retention with chronic indwelling Foley catheter who presented from rehab for evaluation of generalized weakness and anemia.  Labs done at the facility showed hemoglobin of 6.2.  Patient was very somnolent and difficult to arouse on admission.  He was admitted with diagnosis of acute metabolic encephalopathy, anemia, metabolic acidosis, hyperammonemia.  Patient continues to be lethargic he was evaluated by CCM, with CCM recommending no need for intubation.  Neurology consulted and altered mental status thought to be due to metabolic encephalopathy.  Perative care was consulted, CODE STATUS was changed to DNR.  Plan for trial of feeding tube for 72 hours.    Subjective   Patient seen and examined, continues to lethargic.  Awaiting bed at [facility].   Assessment/Plan:     1. Acute blood loss anemia-secondary to GI bleed.  Patient presents with hemoglobin of 6.2, baseline hemoglobin of around 8-9.  Patient received 2 units PRBC.  Hemoglobin is stable at 7.7.  No evidence of active GI bleed.  Continue to monitor hemoglobin and transfuse for hemoglobin less than 7.0.  GI evaluation done, not a candidate for endoscopy.  Continue IV Protonix. 2. Chest pain-EKG showed atrial fibrillation, will obtain troponin x2.  He is not a candidate for aggressive cardiac intervention. 3. Acute metabolic encephalopathy-neurology saw the patient and deemed that he has acute metabolic encephalopathy.  ABG showed pH with respiratory callosal and metabolic acidosis.  EEG negative for seizure.  MRI could  not be done due to pacemaker. 4. Hyperammonemia-ammonia was elevated, 88 has come down to 53.  Likely from GI bleed.  Started on rifaximin.  Lactulose was held due to diarrhea.  Right upper quadrant ultrasound showed no cirrhosis.  S/p cholecystectomy. 5. Transaminitis-right upper quadrant ultrasound unremarkable.  No abnormality seen on liver.  Albumin 1.4, AST 98, ALT 54, alk phos 144, total bili 1.4.  Likely hypotension induced ischemic liver injury.  Follow LFTs in a.m. 6. Sepsis due to CAUTI-patient has chronic indwelling Foley catheter due to urinary retention.  UA showed more than 50 WBCs per high-power field.  IV antibiotics were changed to Zosyn.  Continue IV Zosyn. 7. Nutrition-family and palliative care discussed feeding trial for 72 hours.  Tube feeding was started on 09/28/2019.  72 hours have been completed.  Palliative care discussed with daughter and she wants to discuss with other family members before making decision regarding comfort measures.  Patient's son is arriving tomorrow.  Will discuss with patient's son and palliative care regarding further clarification of goals of care. 8. Paroxysmal atrial fibrillation-aspirin hold due to GI bleed. 9. Acute on chronic kidney disease stage IIIb-prior creatinine 1.4-1.5.  Presented with creatinine 1.8, which improved with IV fluids.  Creatinine is down to 1.07 10. Diabetes mellitus type 2-continue sliding scale insulin with NovoLog. 11. Hypokalemia-replete 12. Metabolic acidosis-likely from sepsis, AKI.  Improved with IV bicarb GTT. 13. Goals of care-patient has very poor prognosis.  Palliative care was consulted for discussion of goals of care with the patient's family.  I discussed with patient's family yesterday regarding patient's poor prognosis and options including residential hospice.  Family has chosen for residential  hospice.    SpO2: 97 % O2 Flow Rate (L/min): 1 L/min   COVID-19 Labs  No results for input(s): DDIMER, FERRITIN,  LDH, CRP in the last 72 hours.  Lab Results  Component Value Date   SARSCOV2NAA NEGATIVE 09/24/2019   [place] NEGATIVE 08/03/2019   [place] NEGATIVE 08/01/2019   [place] NEGATIVE 07/28/2019     CBG: Recent Labs  Lab 10/04/19 2007 10/05/19 0118 10/05/19 0417 10/05/19 0804 10/05/19 1133  GLUCAP 177* 175* 111* 190* 207*    CBC: Recent Labs  Lab 09/30/19 0810  WBC 5.1  HGB 7.7*  HCT 25.5*  MCV 91.7  PLT 58*    Basic Metabolic Panel: Recent Labs  Lab 09/30/19 0810 10/01/19 1702  NA 133* 137  K 3.2* 3.5  CL 103 107  CO2 22 22  GLUCOSE 211* 204*  BUN 9 10  CREATININE 1.23 1.07  CALCIUM 8.2* 8.4*     Liver Function Tests: Recent Labs  Lab 10/01/19 1702  AST 66*  ALT 43  ALKPHOS 157*  BILITOT 1.0  PROT 8.7*  ALBUMIN 1.3*        DVT prophylaxis: SCDs, no anticoagulation due to GI bleed.  Code Status: DNR  Family Communication: No family at bedside    Status is: Inpatient  Dispo: The patient is from: Skilled nursing facility              Anticipated d/c is to: Skilled nursing facility              Anticipated d/c date is: 10/07/2019              Patient currently  medically stable for discharge  Barrier to discharge-awaiting bed at [facility]        Scheduled medications:  . glycopyrrolate  0.4 mg Intravenous Q4H  .  morphine injection  1 mg Intravenous TID    Consultants:  Perative care  Procedures:  None  Antibiotics:   Anti-infectives (From admission, onward)   Start     Dose/Rate Route Frequency Ordered Stop   09/29/19 1115  rifaximin (XIFAXAN) tablet 550 mg  Status:  Discontinued        550 mg Per Tube 2 times daily 09/29/19 1100 10/05/19 1204   09/29/19 1000  rifaximin (XIFAXAN) tablet 550 mg  Status:  Discontinued        550 mg Oral 2 times daily 09/29/19 0917 09/29/19 1100   09/26/19 1800  piperacillin-tazobactam (ZOSYN) IVPB 3.375 g        3.375 g 12.5 mL/hr over 240 Minutes Intravenous Every 8  hours 09/26/19 1405 10/02/19 2128   09/24/19 0630  ceFEPIme (MAXIPIME) 2 g in sodium chloride 0.9 % 100 mL IVPB  Status:  Discontinued        2 g 200 mL/hr over 30 Minutes Intravenous Every 12 hours 09/24/19 0623 09/26/19 1405       Objective   Vitals:   10/05/19 1135 10/05/19 2021 10/06/19 0431 10/06/19 0932  BP: 120/70 138/83 118/75 124/70  Pulse: 78  94 (!) 109  Resp: 15 20 14 18   Temp: 98.5 F (36.9 C) 98 F (36.7 C) 97.7 F (36.5 C) (!) 97.5 F (36.4 C)  TempSrc: Oral Oral Axillary Oral  SpO2: 98%  96% 97%  Weight:      Height:        Intake/Output Summary (Last 24 hours) at 10/06/2019 1622 Last data filed at 10/06/2019 0936 Gross per 24 hour  Intake --  Output  3950 ml  Net -3950 ml    07/10 1901 - 07/12 0700 In: 2232 [I.V.:612] Out: 6100 [Urine:6100]  Filed Weights   10/02/19 0323 10/04/19 0700 10/05/19 0249  Weight: 99 kg 102.5 kg 100.5 kg    Physical Examination:  General-lethargic Heart-S1-S2, regular, no murmur auscultated Lungs-clear to auscultation bilaterally, no wheezing or crackles auscultated Abdomen-soft, nontender, no organomegaly Extremities-no edema in the lower extremities Neuro-lethargic, not following commands   Data Reviewed:   No results found for this or any previous visit (from the past 240 hour(s)).  No results for input(s): LIPASE, AMYLASE in the last 168 hours. Recent Labs  Lab 09/30/19 0810  AMMONIA 53*    Cardiac Enzymes: No results for input(s): CKTOTAL, CKMB, CKMBINDEX, TROPONINI in the last 168 hours. BNP (last 3 results) Recent Labs    09/24/19 0559  BNP 550.9*    ProBNP (last 3 results) No results for input(s): PROBNP in the last 8760 hours.  Studies:  No results found.     Oswald Hillock   [facility] If 7PM-7AM, please contact night-coverage at [email or web address], Office  [phone]   10/06/2019, 4:22 PM  LOS: 12 days

## 2019-10-06 NOTE — Progress Notes (Signed)
Patient ID: [name], male   DOB: 1941-03-19, 79 y.o.   MRN: [id number]  This NP visited patient at the bedside as a follow up for palliative medicine needs and emotional support.  Patient is minimally responsive, appears comfortable.  Plan has been clearly delineated and its focus is comfort, quality and dignity.  I reached out to family to offer support and answer questions and concerns.  I was able to reach daughter Dimas Millin by telephone, she did not verbalize any questions or concerns and we were unfortunately disconnected.  Education offered on natural trajectory and expectation at end of life  I attempted to call back but did not receive an answer.  I was able to leave my name and number and encouraged family to call with questions or concerns.  Questions and concerns addressed   Discussed with Dr Darrick Meigs  Total time spent on the unit was 15 min   PMT will continue to support holistically.  Greater than 50% of the time was spent in counseling and coordination of care  Wadie Lessen NP  Palliative Medicine Team Team Phone # [phone] Pager [phone]

## 2019-10-07 DIAGNOSIS — R627 Adult failure to thrive: Secondary | ICD-10-CM

## 2019-10-07 MED ORDER — ONDANSETRON 4 MG PO TBDP: 4.0000 mg | ORAL_TABLET | Freq: Four times a day (QID) | ORAL | 0 refills | Status: AC | PRN

## 2019-10-07 NOTE — Discharge Summary (Signed)
Physician Discharge Summary  [name] QPY:[id number] DOB: April 15, 1940 DOA: 09/23/2019  PCP: Nicolette Bang, DO  Admit date: 09/23/2019 Discharge date: 10/07/2019  Time spent: 50 minutes  Recommendations for Outpatient Follow-up:  1. Transfer to residential hospice, [facility] today for end-of-life care.    Discharge Diagnoses:  Principal Problem:   Symptomatic anemia Active Problems:   Diabetes mellitus, type II (HCC)   Diastolic CHF, chronic (HCC)   GI bleed   Hypokalemia   Acute encephalopathy   Palliative care by specialist   Goals of care, counseling/discussion   DNR (do not resuscitate)   Malnutrition of moderate degree   Dysphagia   Adult failure to thrive   Discharge Condition: Stable  Diet recommendation: Comfort diet  Filed Weights   10/02/19 0323 10/04/19 0700 10/05/19 0249  Weight: 99 kg 102.5 kg 100.5 kg    History of present illness:  79 year old male with medical history of paroxysmal atrial fibrillation on low-dose aspirin, not on anticoagulation due to chronic anemia and history of GI bleed, CAD, COPD, diabetes mellitus type 2, chronic diastolic heart failure, GERD, history of gastric ulcer, urine retention with chronic indwelling Foley catheter who presented from rehab for evaluation of generalized weakness and anemia.  Labs done at the facility showed hemoglobin of 6.2.  Patient was very somnolent and difficult to arouse on admission.  He was admitted with diagnosis of acute metabolic encephalopathy, anemia, metabolic acidosis, hyperammonemia.  Patient continues to be lethargic he was evaluated by CCM, with CCM recommending no need for intubation.  Neurology consulted and altered mental status thought to be due to metabolic encephalopathy.  Perative care was consulted, CODE STATUS was changed to DNR.  Plan for trial of feeding tube for 72 hours.   Hospital Course:   1. Acute blood loss anemia-secondary to GI bleed.  Patient presents with  hemoglobin of 6.2, baseline hemoglobin of around 8-9.  Patient received 2 units PRBC.  Hemoglobin is stable at 7.7.  No evidence of active GI bleed.  Continue to monitor hemoglobin and transfuse for hemoglobin less than 7.0.  GI evaluation done, not a candidate for endoscopy.  2. Chest pain-EKG showed atrial fibrillation, will obtain troponin x2.  He is not a candidate for aggressive cardiac intervention. 3. Acute metabolic encephalopathy-neurology saw the patient and deemed that he has acute metabolic encephalopathy.  ABG showed pH with respiratory callosal and metabolic acidosis.  EEG negative for seizure.  MRI could not be done due to pacemaker. 4. Hyperammonemia-ammonia was elevated, 88 has come down to 53.  Likely from GI bleed.  Started on rifaximin.  Lactulose was held due to diarrhea.  Right upper quadrant ultrasound showed no cirrhosis.  S/p cholecystectomy. 5. Transaminitis-right upper quadrant ultrasound unremarkable.  No abnormality seen on liver.  Albumin 1.4, AST 98, ALT 54, alk phos 144, total bili 1.4.  Likely hypotension induced ischemic liver injury.   6. Sepsis present on admission due to CAUTI-patient has chronic indwelling Foley catheter due to urinary retention.  UA showed more than 50 WBCs per high-power field.  IV antibiotics were changed to Zosyn.  Antibiotics stopped after admission made comfort care only. 7. Nutrition-family and palliative care discussed feeding trial for 72 hours.  Tube feeding was started on 09/28/2019.  72 hours have been completed.  Palliative care discussed with daughter and patient's son.  No PEG tube. 8. Paroxysmal atrial fibrillation-aspirin hold due to GI bleed. 9. Acute on chronic kidney disease stage IIIb-prior creatinine 1.4-1.5.  Presented with creatinine  1.8, which improved with IV fluids.  Creatinine is down to 1.07 10. Diabetes mellitus type 2-stable.  Patient is off feeding tube.  Not eating and drinking. 11. Hypokalemia-replete 12. Metabolic  acidosis-likely from sepsis, AKI.  Improved with IV bicarb GTT. 13. Goals of care-patient has very poor prognosis.  Palliative care was consulted for discussion of goals of care with the patient's family.  I discussed with patient's family yesterday regarding patient's poor prognosis and options including residential hospice.  Family has chosen for residential hospice  Patient will transfer to residential hospice for end-of-life care today.  Procedures:    Consultations:    Discharge Exam: Vitals:   10/06/19 2039 10/07/19 1340  BP: 121/71 (!) 107/55  Pulse: (!) 105 (!) 106  Resp: 20 20  Temp: 98.4 F (36.9 C) 98.3 F (36.8 C)  SpO2: 98% 94%    General: Appears lethargic Cardiovascular: S1-S2, regular Respiratory: Clear to auscultation bilaterally  Discharge Instructions   Discharge Instructions    Diet - low sodium heart healthy   Complete by: As directed    Increase activity slowly   Complete by: As directed      Allergies as of 10/07/2019      Reactions   Cyclobenzaprine Other (See Comments)   Unsteady gait   Other Other (See Comments)   Steroids speeds his heart rate      Medication List    STOP taking these medications   aspirin EC 81 MG tablet   bisacodyl 10 MG suppository Commonly known as: DULCOLAX   citalopram 20 MG tablet Commonly known as: CELEXA   diltiazem 120 MG 24 hr capsule Commonly known as: CARDIZEM CD   ferrous sulfate 325 (65 FE) MG tablet   gabapentin 100 MG capsule Commonly known as: NEURONTIN   glimepiride 1 MG tablet Commonly known as: AMARYL   Glucerna Liqd   magnesium hydroxide 400 MG/5ML suspension Commonly known as: MILK OF MAGNESIA   nitroGLYCERIN 0.4 MG SL tablet Commonly known as: NITROSTAT   NON FORMULARY   pantoprazole 40 MG tablet Commonly known as: PROTONIX   pravastatin 40 MG tablet Commonly known as: PRAVACHOL   RA SALINE ENEMA RE   senna-docusate 8.6-50 MG tablet Commonly known as: Senokot-S    tamsulosin 0.4 MG Caps capsule Commonly known as: FLOMAX     TAKE these medications   acetaminophen 650 MG CR tablet Commonly known as: TYLENOL Take 1,300 mg by mouth at bedtime. FOR LEFT SIDE PAIN (DO NOT CRUSH)   ondansetron 4 MG disintegrating tablet Commonly known as: ZOFRAN-ODT Take 1 tablet (4 mg total) by mouth every 6 (six) hours as needed for nausea.      Allergies  Allergen Reactions  . Cyclobenzaprine Other (See Comments)    Unsteady gait  . Other Other (See Comments)    Steroids speeds his heart rate      The results of significant diagnostics from this hospitalization (including imaging, microbiology, ancillary and laboratory) are listed below for reference.    Significant Diagnostic Studies: CT HEAD WO CONTRAST  Result Date: 09/24/2019 CLINICAL DATA:  Altered mental status EXAM: CT HEAD WITHOUT CONTRAST TECHNIQUE: Contiguous axial images were obtained from the base of the skull through the vertex without intravenous contrast. COMPARISON:  07/28/2019 FINDINGS: Brain: There is no acute intracranial hemorrhage, mass effect, or edema. Gray-white differentiation is preserved. There is no extra-axial fluid collection. Prominence of the ventricles and sulci reflects stable parenchymal volume loss. There are small chronic infarcts of the right  caudate, corona radiata, and left cerebellum. Additional patchy and confluent areas of hypoattenuation in the supratentorial white matter are nonspecific but probably reflects stable moderate chronic microvascular ischemic changes. Vascular: There is atherosclerotic calcification at the skull base. Skull: Calvarium is unremarkable. Sinuses/Orbits: Mild mucosal thickening. Bilateral lens replacements. Other: Mastoid air cells are clear. IMPRESSION: No acute intracranial hemorrhage, mass effect, or evidence of acute infarction. Stable chronic findings detailed above. Electronically Signed   By: Macy Mis M.D.   On: 09/24/2019 08:08    CT ABDOMEN PELVIS W CONTRAST  Result Date: 09/24/2019 CLINICAL DATA:  Weakness, anemia EXAM: CT ABDOMEN AND PELVIS WITH CONTRAST TECHNIQUE: Multidetector CT imaging of the abdomen and pelvis was performed using the standard protocol following bolus administration of intravenous contrast. CONTRAST:  123m OMNIPAQUE IOHEXOL 300 MG/ML  SOLN COMPARISON:  08/03/2019 FINDINGS: Lower chest: There is a small right pleural effusion volume estimated less than 1 L. minimal dependent atelectasis within the lower lobes. Hepatobiliary: Gallbladder is surgically absent. Slight increase in periportal edema since prior study. This is nonspecific. The remainder of the liver is unremarkable. Pancreas: Unremarkable. No pancreatic ductal dilatation or surrounding inflammatory changes. Spleen: Normal in size without focal abnormality. Adrenals/Urinary Tract: The kidneys enhance normally and symmetrically. No urinary tract calculi or obstruction. The adrenals are normal. Bladder is decompressed with a Foley catheter. Stomach/Bowel: No bowel obstruction or ileus. Moderate stool throughout the colon, most pronounced within the rectal vault which may reflect impaction. Normal appendix right lower quadrant. Vascular/Lymphatic: Aortic atherosclerosis. No enlarged abdominal or pelvic lymph nodes. Reproductive: Prostate is unremarkable. Other: Haziness of the central mesenteric fat at the level of the celiac axis is unchanged. No free fluid or free gas. No abdominal wall hernia. Musculoskeletal: No acute or destructive bony lesions. Reconstructed images demonstrate no additional findings. IMPRESSION: 1. Small right pleural effusion volume estimated less than 1 L. 2. Moderate stool throughout the colon, most pronounced within the rectal vault which may reflect impaction. 3. Slight increase in periportal edema since prior study. This is nonspecific. 4. Aortic Atherosclerosis (ICD10-I70.0). Electronically Signed   By: MRanda NgoM.D.    On: 09/24/2019 02:52   DG Chest Port 1 View  Result Date: 09/24/2019 CLINICAL DATA:  Weakness EXAM: PORTABLE CHEST 1 VIEW COMPARISON:  07/28/2019 FINDINGS: Single frontal view of the chest demonstrates stable dual lead pacemaker. Cardiac silhouette is mildly enlarged. There is chronic central vascular congestion, which has progressed slightly on this exam. No airspace disease, effusion, or pneumothorax. No acute bony abnormalities. IMPRESSION: 1. Progressive central vascular congestion without overt edema. Electronically Signed   By: MRanda NgoM.D.   On: 09/24/2019 00:55   DG NLoyce DysTube Plc W/Fl W/Rad  Result Date: 09/28/2019 CLINICAL DATA:  79year old male in need of enteral access for feeding. EXAM: NASO G TUBE PLACEMENT WITH FL AND WITH RAD CONTRAST:  25mOMNIPAQUE IOHEXOL 300 MG/ML  SOLN FLUOROSCOPY TIME:  Fluoroscopy Time:  1 minutes and 18 seconds Radiation Exposure Index (if provided by the fluoroscopic device): 10.8 mGy COMPARISON:  None. FINDINGS: Feeding tube placed under fluoroscopic guidance, with tip extending into the jejunum via the patient's gastrojejunostomy. Injection of the tube confirmed proper placement of the tip within the small bowel. IMPRESSION: 1. Fluoroscopic guided placement of feeding tube into the jejunum, as above. Electronically Signed   By: DaVinnie Langton.D.   On: 09/28/2019 11:11   EEG adult  Result Date: 09/26/2019 SoAntionette PolesMD     09/26/2019 11:59 AM  [facility] Routine Inpatient Electroencephalogram (EEG) Indication: Encephalopathy Date of Study: 09/25/2019 Brief History: 79 year old white male presenting with altered mental status and recurrent falls.  He was found to have a downward gaze. Description:  This is a routine inpatient EEG using the International Standard 10-20 system of electrode placement. Photic stimulation and hyperventilation were deferred. The background showed theta slowing at 4-6 hertz, with also  diffuse theta slowing as well. There were no definitive epileptiform discharges or electrographic seizures seen. Impression:  This is an abnormal EEG due to the presence of moderate diffuse slowing.  Findings are suggestive of an underlying encephalopathy possibly related to toxic, metabolic conditions, versus diffuse structural brain abnormalities.  The absence of epileptiform discharges does not necessarily rule out an underlying seizure disorder.  Clinical correlation is advised.   US Abdomen Limited RUQ  Result Date: 09/24/2019 CLINICAL DATA:  Elevated liver function studies. EXAM: ULTRASOUND ABDOMEN LIMITED RIGHT UPPER QUADRANT COMPARISON:  CT scan 09/24/2019 FINDINGS: Gallbladder: Surgically absent. Common bile duct: Diameter: 5.8 mm Liver: Normal echogenicity. No focal hepatic lesions. Portal vein is patent on color Doppler imaging with normal direction of blood flow towards the liver. Other: Right pleural effusion noted. IMPRESSION: 1. Status post cholecystectomy.  No biliary dilatation 2. Unremarkable sonographic appearance of the liver. 3. Right pleural effusion. Electronically Signed   By: Marijo Sanes M.D.   On: 09/24/2019 06:56    Microbiology: No results found for this or any previous visit (from the past 240 hour(s)).   Labs: Basic Metabolic Panel: Recent Labs  Lab 10/01/19 1702  NA 137  K 3.5  CL 107  CO2 22  GLUCOSE 204*  BUN 10  CREATININE 1.07  CALCIUM 8.4*   Liver Function Tests: Recent Labs  Lab 10/01/19 1702  AST 66*  ALT 43  ALKPHOS 157*  BILITOT 1.0  PROT 8.7*  ALBUMIN 1.3*    BNP (last 3 results) Recent Labs    09/24/19 0559  BNP 550.9*    ProBNP (last 3 results) No results for input(s): PROBNP in the last 8760 hours.  CBG: Recent Labs  Lab 10/04/19 2007 10/05/19 0118 10/05/19 0417 10/05/19 0804 10/05/19 1133  GLUCAP 177* 175* 111* 190* 207*       Signed:  Oswald Hillock MD.  [facility] 10/07/2019, 2:00 PM

## 2019-10-07 NOTE — Plan of Care (Signed)

## 2019-10-07 NOTE — Progress Notes (Signed)
[facility] ([facility]) Hospital Liaison note.    Received request from TOC manager for family interest in [facility]. Chart reviewed and eligibility confirmed. Spoke with family to confirm interest and explain services. Family agreeable to transfer today. TOC aware.     [facility] will notify TOC when registration paperwork has been completed to arrange transport.    RN please call report to [phone].   Thank you,      Mary Anne Robertson, RN, CCM       [facility] Hospital Liaison (listed on [facility] under [facility]osp[facility])     [phone]  

## 2019-10-08 DIAGNOSIS — R0609 Other forms of dyspnea: Secondary | ICD-10-CM

## 2019-10-08 DIAGNOSIS — R52 Pain, unspecified: Secondary | ICD-10-CM

## 2019-10-08 MED ORDER — ACETAMINOPHEN 160 MG/5ML PO SOLN: 650.0000 mg | Freq: Four times a day (QID) | ORAL | Status: DC | PRN

## 2019-10-08 NOTE — Progress Notes (Signed)
Patient ID: [name], male   DOB: 07-22-1940, 79 y.o.   MRN: [id number]  This NP visited patient at the bedside as a follow up for palliative medicine needs and emotional support.  Patient is minimally responsive, appears comfortable.  Plan has been clearly delineated by family and focus of care  is comfort, quality and dignity.  Apparently there has been some confusion between family and transition of care team regarding patient's transition to [facility].  Spoke to daughter/ [employer] by telephone.  Continued conversation regarding current medical situation; diagnosis, prognosis, end-of-life wishes, disposition and options.      [name] is clear that the focus of care is comfort for her father.   Education offered on the natural trajectory and expectations at end of life.  When [name]  and I first spoke this morning she was still wavering on whether the decision was for her father to transition to [facility].  She wanted to speak to her other siblings before verbalizing a definitive answer.  Her worry was "my dad may have to leave there after 2 or 3 weeks".  Education offered on the patient's prognosis of being likely less than 2 weeks.  Education offered on the focus of care at [facility] quality care at end of life.  Questions and concerns addressed      [name] called me back this afternoon and verbalizes her families hope and desire that the patient can transition to [facility] tomorrow for end-of-life care.  I spoke to [facility] love/liaison with hospice.  Patient will hopefully transition to [facility].  Total time spent on the unit was 25 min   PMT will continue to support holistically.  Greater than 50% of the time was spent in counseling and coordination of care  Wadie Lessen NP  Palliative Medicine Team Team Phone # [phone] Pager [phone]

## 2019-10-08 NOTE — Progress Notes (Signed)
Patient currently waiting for placement at hospice, he has been deemed  poor prognosis, currently under comfort care.  He remains poorly responsive, will continue palliative care with morphine and lorazepam.  Follow-up with social services for placement.

## 2019-10-08 NOTE — Progress Notes (Signed)
[facility] Documentation  Liaison call pt's daughter, [name], at [phone] to offer pt bed at [facility] with no answer. VM left to return call.   Liaison then called pt's daughter, Henrine Screws, at 1141 to offer bed and she stated that they have not decided if they want pt to come to [facility].   As of 1307, liaison has not heard back from family and we have offered bed to the next pt on the list.   There is a possibility of bed availability tomorrow should family decide they want [facility].   Please do not hesitate to outreach with any questions.   Thank you,  Freddie Breech, RN  [facility] Liaison [phone]

## 2019-10-08 NOTE — Progress Notes (Signed)
Chaplain responded to consult where patient had requested prayer. No family present, patient was sleeping.  Chaplain offered words of prayer.  Chaplain available as needed. [place], MDiv.     10/08/19 2200  Clinical Encounter Type  Visited With Patient  Visit Type Spiritual support  Referral From Patient;Chaplain  Consult/Referral To [employer]

## 2019-10-09 NOTE — Progress Notes (Signed)
Patient has remained stable, continue to be poorly reactive. Temperature 98.2, blood pressure 123/64 heart rate 113, oxygen saturation 93% on room air. Patient poorly responsive, his lungs are clear to auscultation bilaterally, heart S1-S2 present rhythmic, soft abdomen and no lower extremity edema.  Plan to transfer patient today to hospice.

## 2019-10-09 NOTE — Progress Notes (Signed)
Report called to [facility]. All questions answered.   Arletta Bale, RN

## 2019-10-09 NOTE — Progress Notes (Signed)
   10/09/19 0940  Clinical Encounter Type  Visited With Patient  Visit Type Follow-up;Spiritual support  Referral From Palliative care team  Consult/Referral To Chaplain  Spiritual Encounters  Spiritual Needs Prayer  This chaplain responded to PMT consult for spiritual care. The chaplain introduced herself to the Pt. RN-[place]la[place].  The chaplain understands from the RN the Pt. has been without visitors.  The chaplain was pastorally present bedside with the Pt. The Pt. was able to follow the chaplain's voice with his eyes.  The chaplain held the Pt. hand and prayed.  F/U spiritual care will continue to be available as needed.

## 2019-10-09 NOTE — Progress Notes (Signed)
Plan of care reviewed. Continue comfort care and monitor.   10/08/19 1956  Vitals  Temp 99 F (37.2 C)  Temp Source Oral  BP 131/72  MAP (mmHg) 88  BP Location Right Arm  BP Method Automatic  Patient Position (if appropriate) Lying  Pulse Rate (!) 121  Pulse Rate Source Monitor  ECG Heart Rate (!) 124  Cardiac Rhythm Atrial fibrillation  Resp 16  Level of Consciousness  Level of Consciousness Responds to Voice  Oxygen Therapy  SpO2 93 %  O2 Device Room Air  Patient Activity (if Appropriate) In bed  Pulse Oximetry Type Continuous  Pain Assessment  Pain Scale Faces  Pain Score Asleep  Faces Pain Scale 0  Complaints & Interventions  Neuro symptoms relieved by Rest;Relaxation techniques (Comment)  Diarrhea relieved by Nothing  PCA/Epidural/Spinal Assessment  Respiratory Pattern Regular;Unlabored;Symmetrical  Glasgow Coma Scale  Eye Opening 3  Best Verbal Response (NON-intubated) 2  Best Motor Response 5  Glasgow Coma Scale Score 10    Kennyth Lose, RN

## 2019-10-09 NOTE — Progress Notes (Signed)
Manufacturing engineer [facility]) Hospital Liaison note.     Family agreeable to transfer to [facility] today.      Consents are complete, please arrange transport to [facility].  RN please call report to [phone].   Thank you,     Farrel Gordon, RN, CCM       [facility] (listed on [place] under Hosp[facility])     [phone]

## 2019-10-09 NOTE — TOC Transition Note (Signed)
Transition of Care [facility]) - CM/SW Discharge Note   Patient Details  Name: [name] MRN: [id number] Date of Birth: [birth year]  Transition of Care [facility]) CM/SW Contact:  Servando Snare, LCSW Phone Number: 10/09/2019, 11:46 AM   Clinical Narrative:   Patient to transfer to [facility] place by EMS. EMS transport arranged at 11:47 AM.  RN please call report to [phone].   Servando Snare, [place] CSW [phone]    Final next level of care: [place] Barriers to Discharge: No Barriers Identified   Patient Goals and CMS Choice Patient states their goals for this hospitalization and ongoing recovery are:: per family" to move closer to family"   Choice offered to / list presented to : NA  Discharge Placement              Patient chooses bed at:  [facility]) Patient to be transferred to facility by: EMS      Discharge Plan and Services In-house Referral: Clinical Social Work                DME Agency: NA       HH Arranged: NA [place] Agency: NA        Social Determinants of Health (SDOH) Interventions     Readmission Risk Interventions Readmission Risk Prevention Plan 08/05/2019  Transportation Screening Complete  PCP or Specialist Appt within 3-5 Days Complete  HRI or Home Care Consult Complete  Social Work Consult for [place] Planning/Counseling Complete  Palliative Care Screening Not Applicable  Medication Review Press photographer) Complete  Some recent data might be hidden

## 2019-10-26 DEATH — deceased

## 2019-12-12 ENCOUNTER — Telehealth: Payer: Self-pay | Admitting: *Deleted

## 2019-12-12 NOTE — Telephone Encounter (Signed)
Received returned mail stamped with "Return to sender.  Deceased.  Unable to forward.  Return to Sender."  Unable to find obituary online.  Pt discharged to [facility] center on 10/09/19 per notes.  Unenrolled from [facility], upcoming appointments and recalls canceled.  Routed to Dr. Curt Bears and Venida Jarvis RN, as Juluis Rainier.

## 2019-12-30 IMAGING — CT CT HEAD WITHOUT CONTRAST
4 series · 14 of 47 positions shown, 16 images · non-contrast
Comparison: CT head dated April 14, 2018

CLINICAL DATA: Head trauma.  Ataxia.

EXAM:
CT HEAD WITHOUT CONTRAST
TECHNIQUE: Contiguous axial images were obtained from the base of the skull
through the vertex without intravenous contrast.

[Series 3: head without · axial · non-contrast · 0.48mm/px · z∈[-168,-48]mm · 7 of 34 slices shown, 9 images]
[im 5/34  brain]
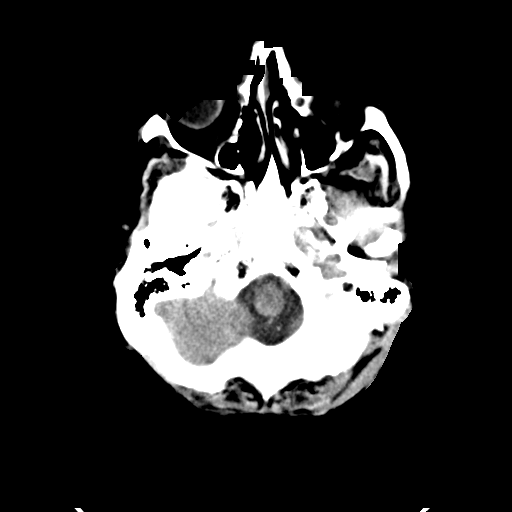
[im 5/34  bone]
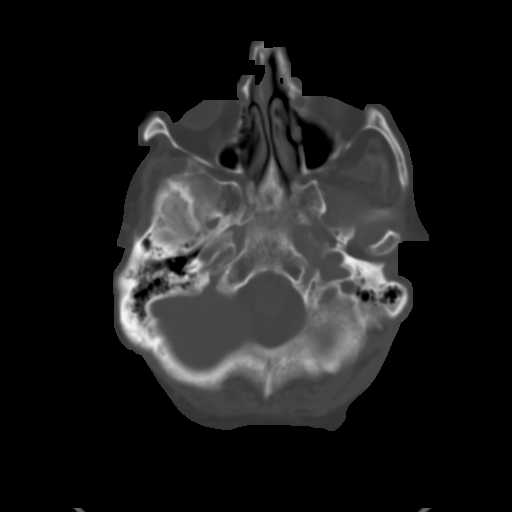
[im 9/34  brain]
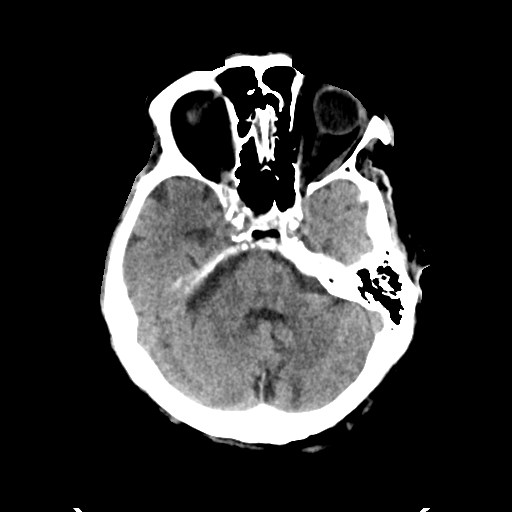
[im 13/34  brain]
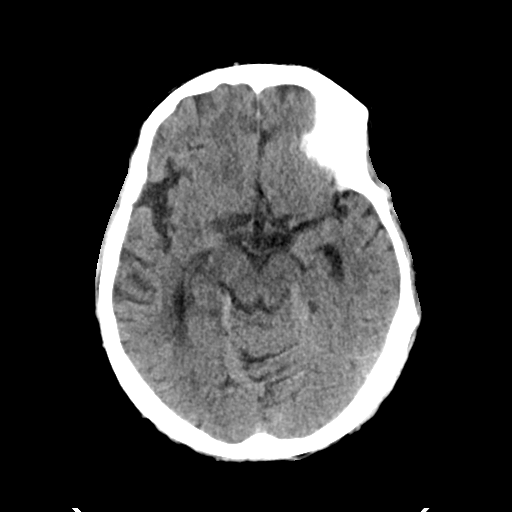
[im 17/34  brain]
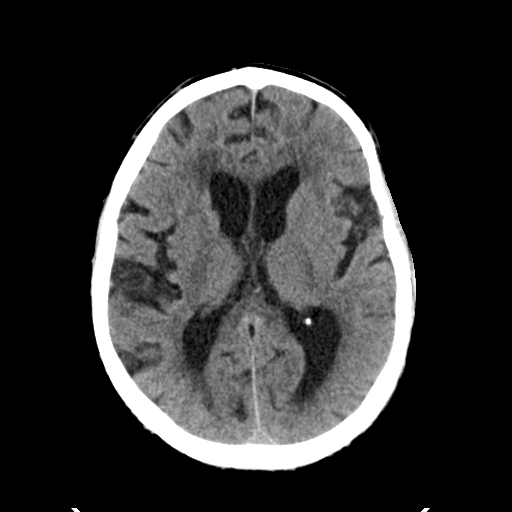
[im 21/34  brain]
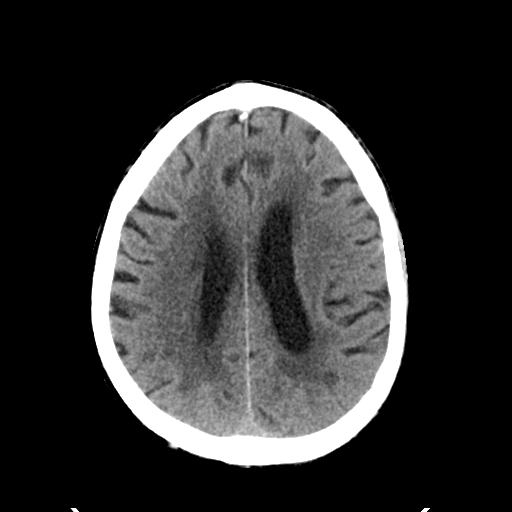
[im 21/34  bone]
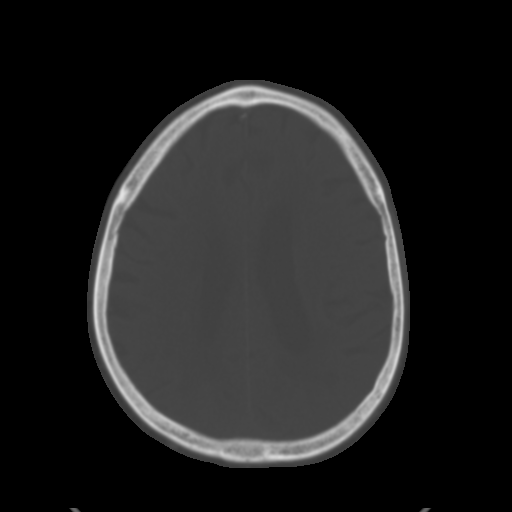
[im 25/34  brain]
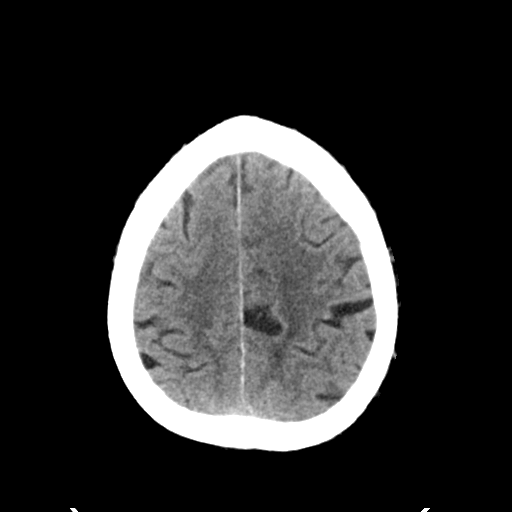
[im 29/34  brain]
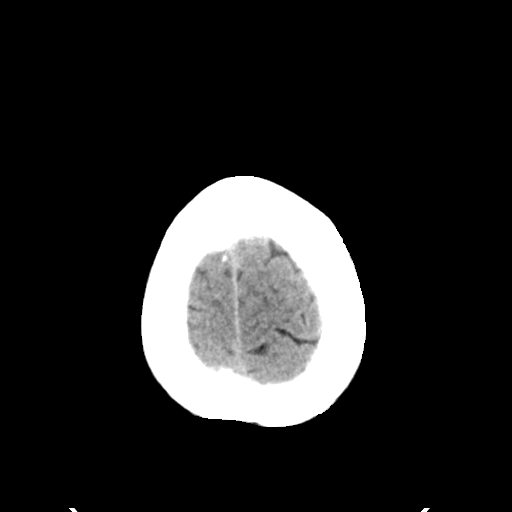

[Series 4: head bone · axial · 0.48mm/px · 1 of 84 slices shown]
[im 9/84  bone]
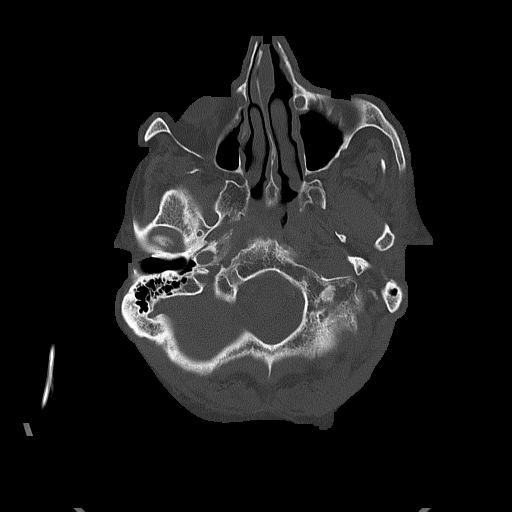

[Series 5: head without cor · coronal · non-contrast · 0.33mm/px · 3 of 72 slices shown]
[im 24/72  brain]
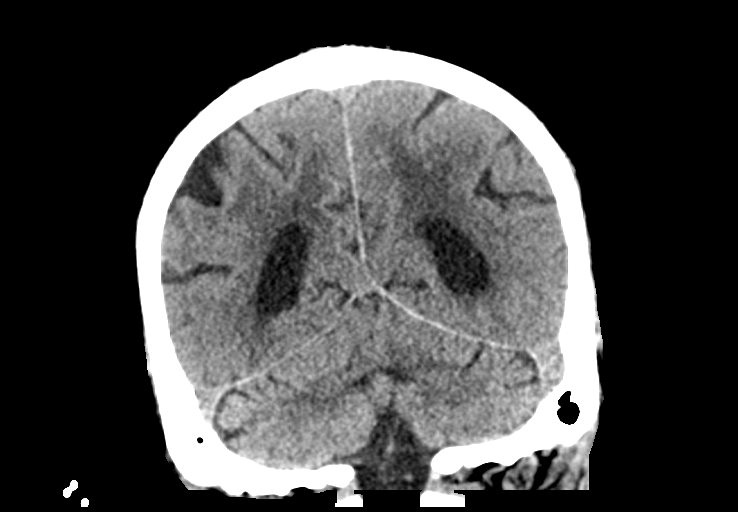
[im 32/72  brain]
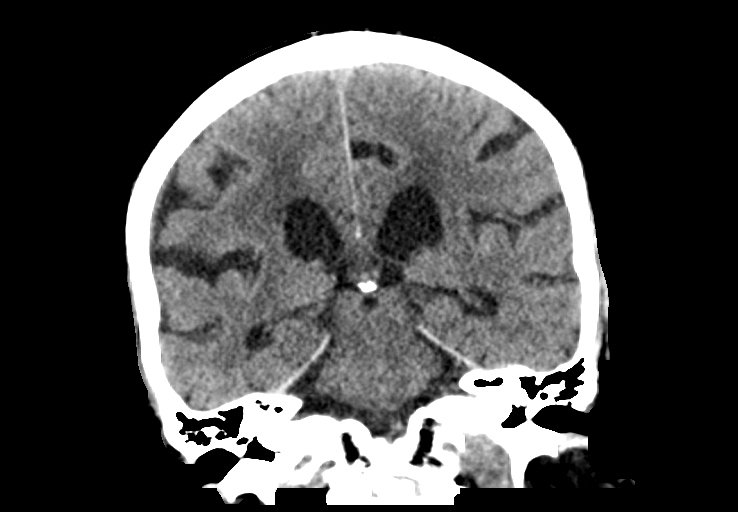
[im 40/72  brain]
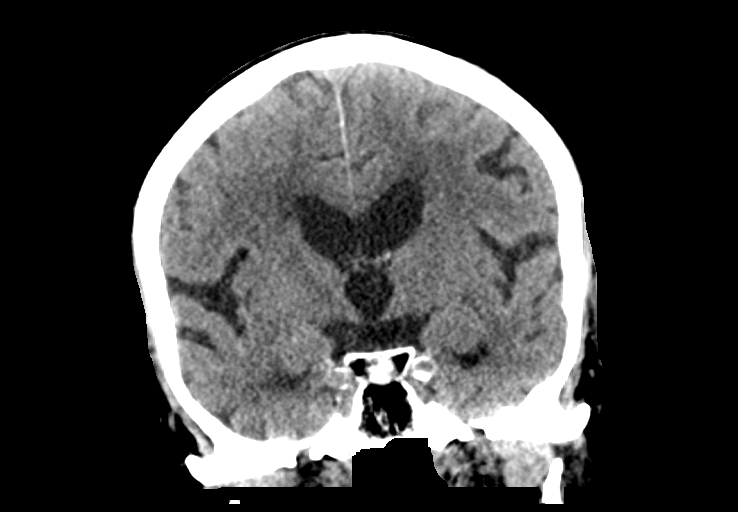

[Series 6: head without sag · sagittal · non-contrast · 0.33mm/px · 3 of 67 slices shown]
[im 23/67  brain]
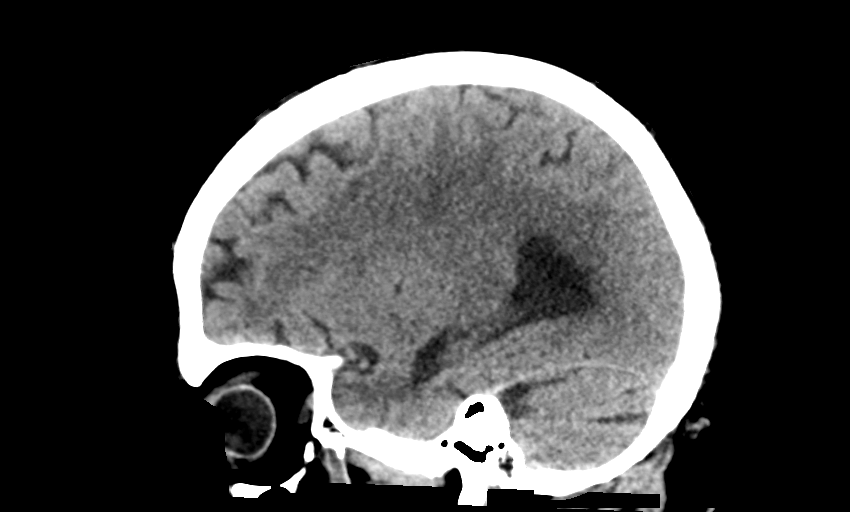
[im 34/67  brain]
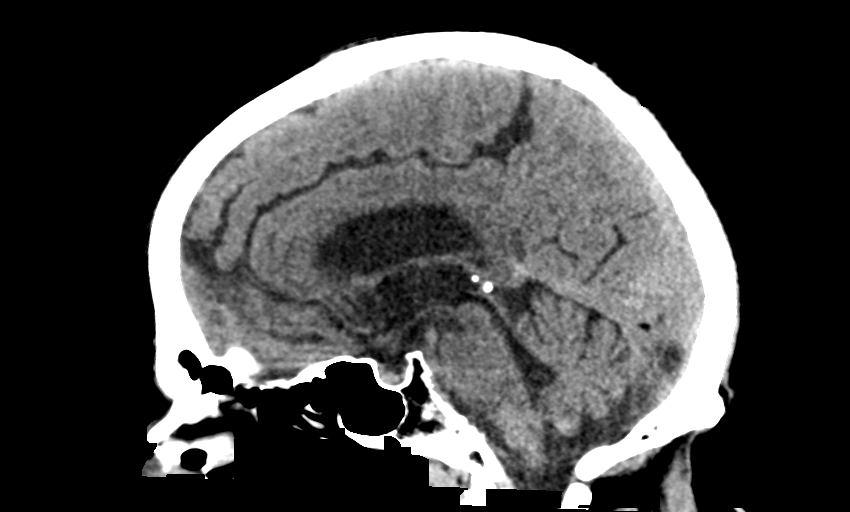
[im 45/67  brain]
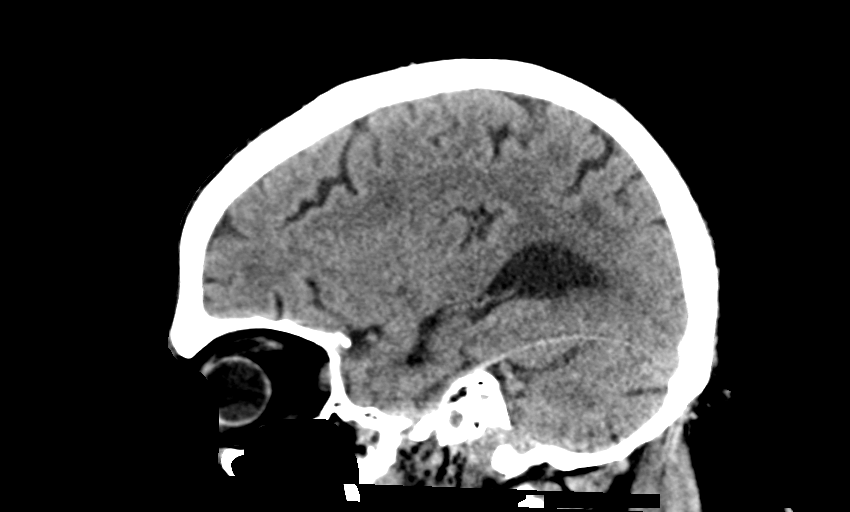

[14 of 47 positions shown; findings below may reference images not displayed]

FINDINGS: Brain: No evidence of acute infarction, hemorrhage, hydrocephalus,
extra-axial collection or mass lesion/mass effect. Volume loss and
chronic microvascular ischemic changes are noted. There is an old
infarct in the left cerebellum.

Vascular: No hyperdense vessel or unexpected calcification.

Skull: Normal. Negative for fracture or focal lesion.

Sinuses/Orbits: No acute finding.

Other: None.
IMPRESSION: No acute intracranial abnormality.

## 2020-01-02 IMAGING — DX CHEST - 2 VIEW
2 series · 3 of 3 positions shown · non-contrast
Comparison: PA and lateral chest 07/10/2018

CLINICAL DATA: Patient status post pacemaker placement 10/21/2018.

EXAM:
CHEST - 2 VIEW

[Series 2: chest lat · 0.14mm/px · 2 of 2 slices shown]
[im 1/2]
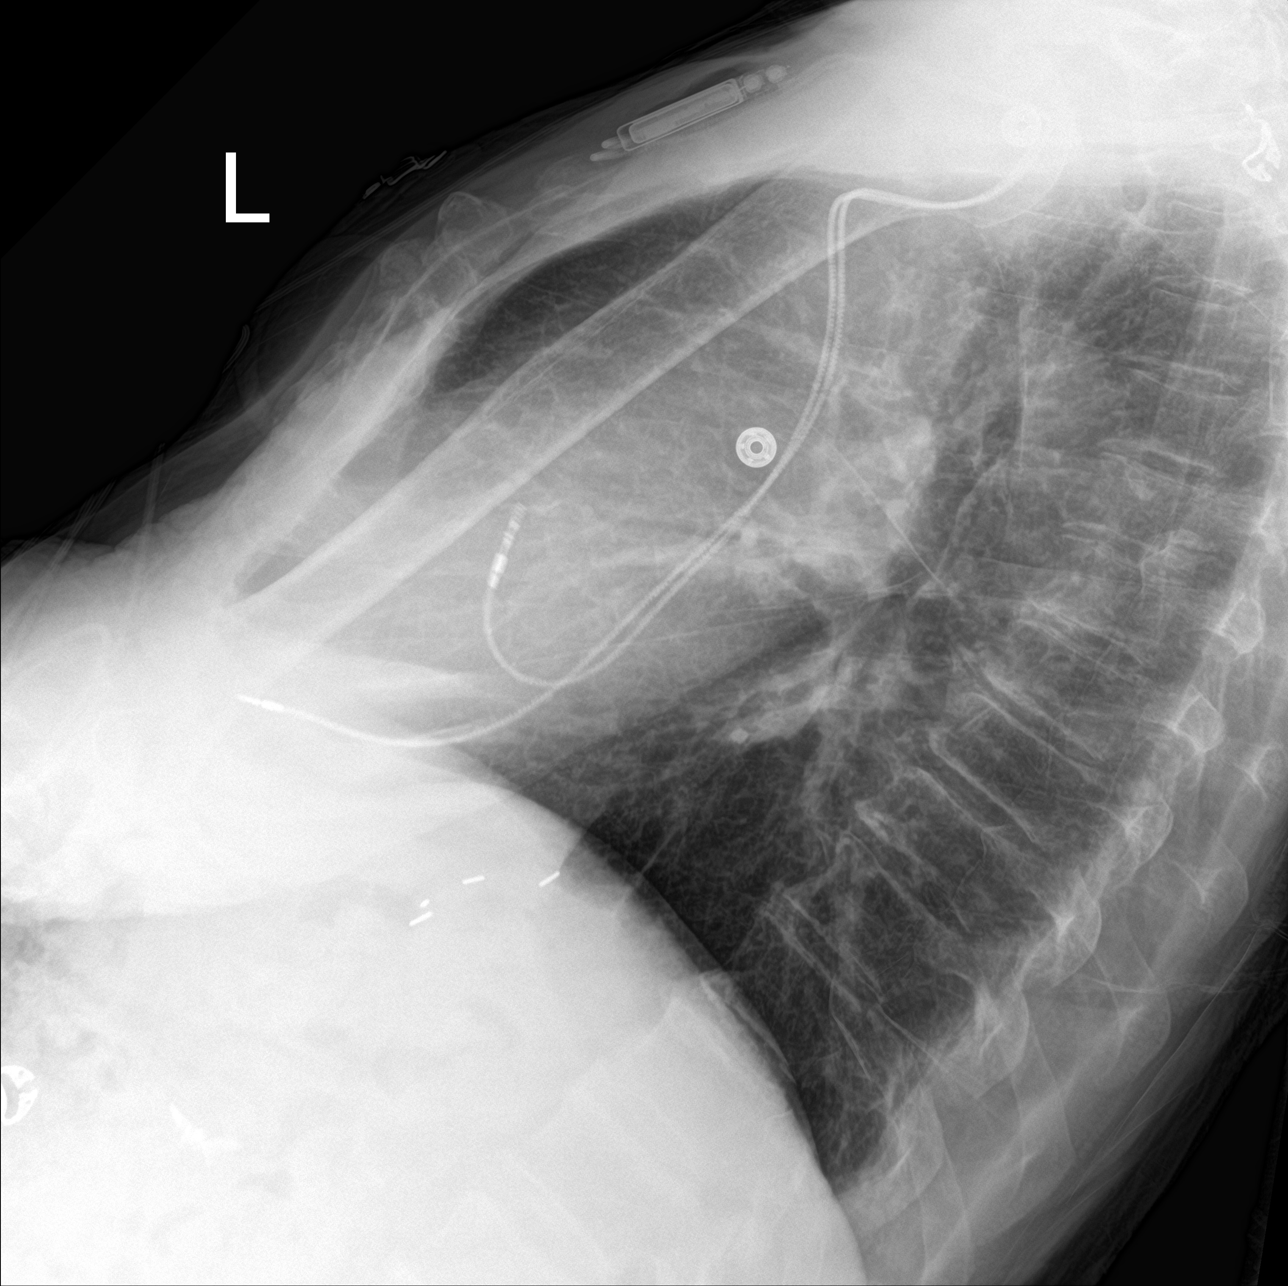
[im 2/2]
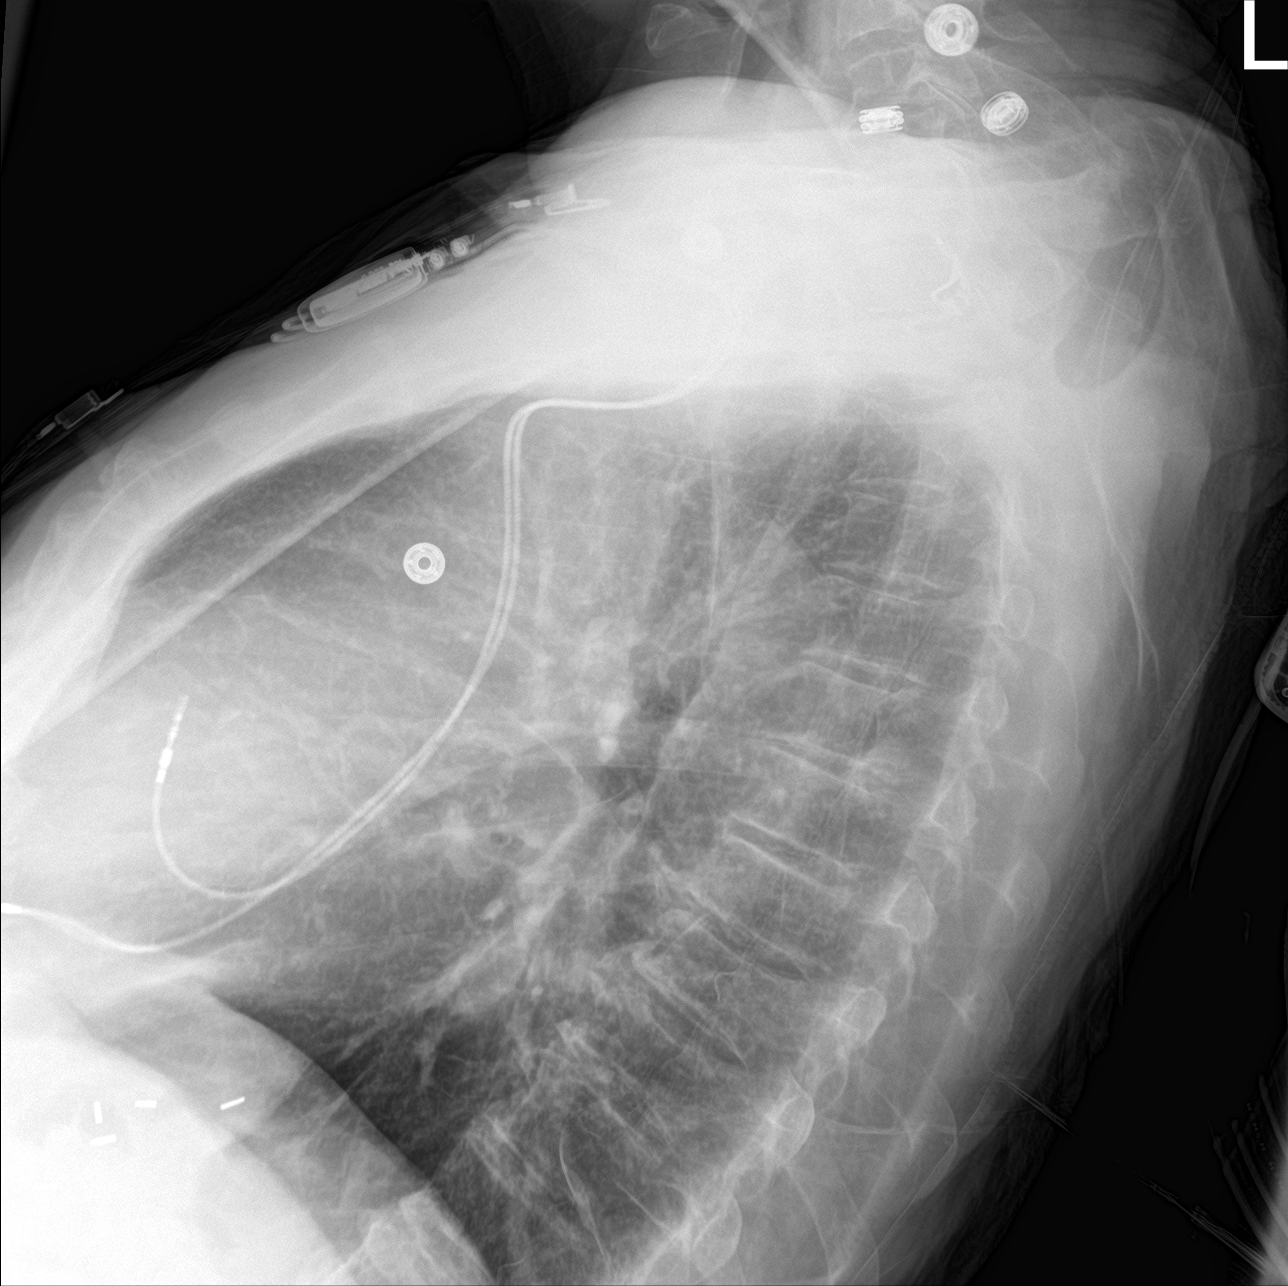

[chest ap]
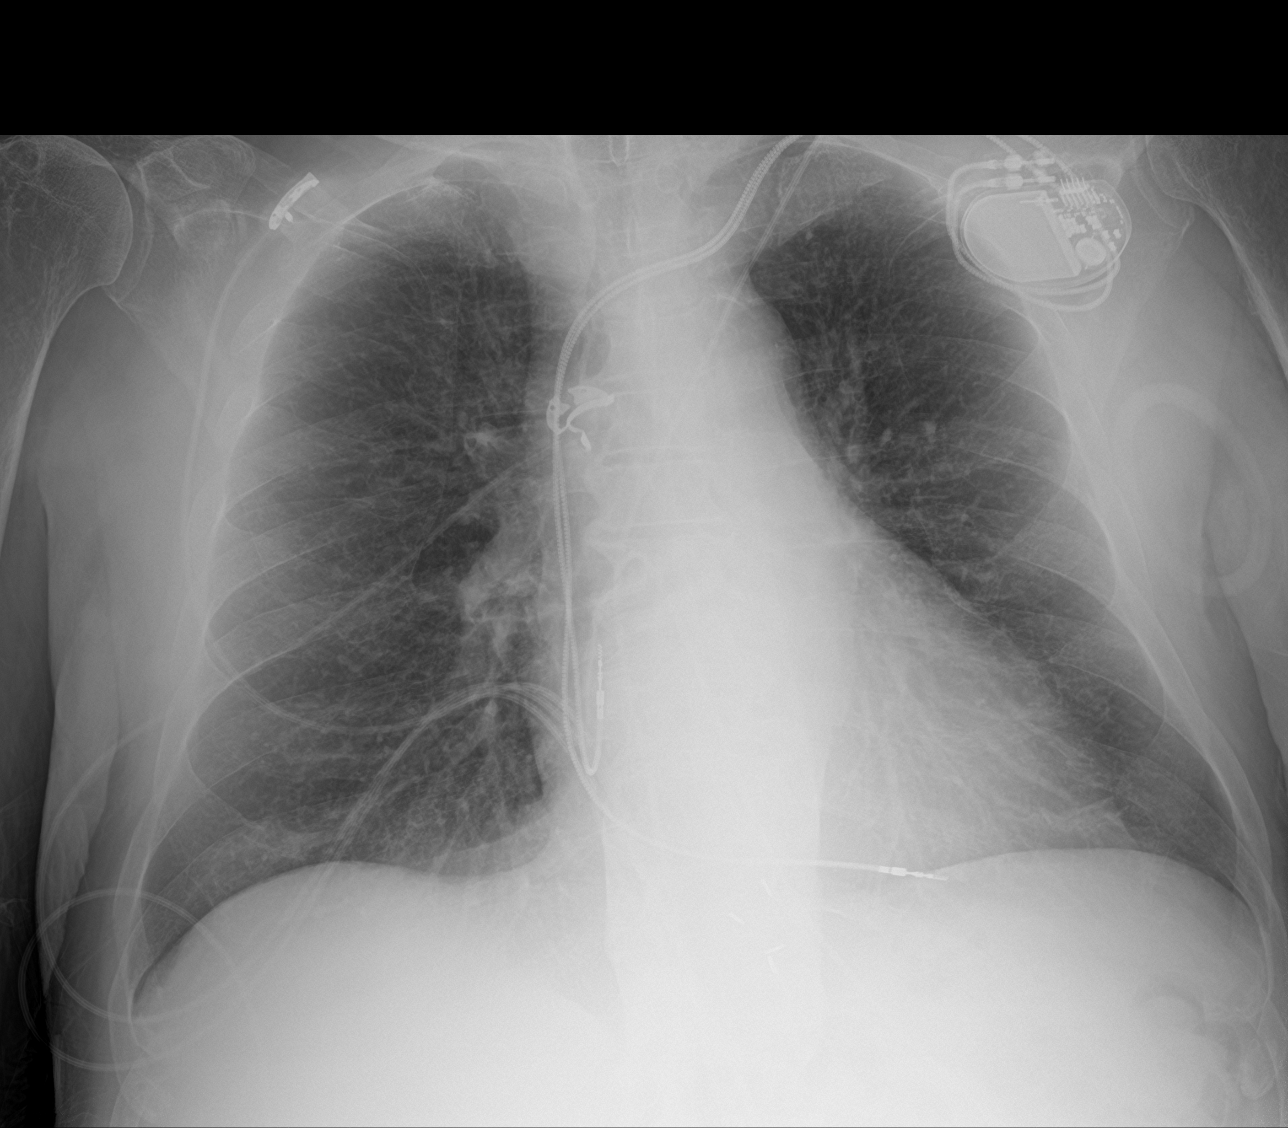

[3 of 3 positions shown; findings below may reference images not displayed]

FINDINGS: New dual lead pacing device is in place with tips of the leads in
the right atrium and right ventricle. Leads are intact. Generator is
over the upper left chest. Lungs are clear. No pneumothorax or
pleural effusion. Emphysema noted. Heart size is normal. Aortic
atherosclerosis is identified. No acute or focal bony abnormality.
IMPRESSION: Pacing leads project in the right atrium and right ventricle.
Negative for pneumothorax or acute disease.

Emphysema.

Atherosclerosis.

## 2020-01-24 IMAGING — CR LEFT WRIST - COMPLETE 3+ VIEW
4 series · 4 of 4 positions shown · non-contrast
Comparison: 07/21/2014.

CLINICAL DATA: Ten day history of LEFT wrist pain, edema and
erythema. No known injuries.

EXAM:
LEFT WRIST - COMPLETE 3+ VIEW

[wrist pa]
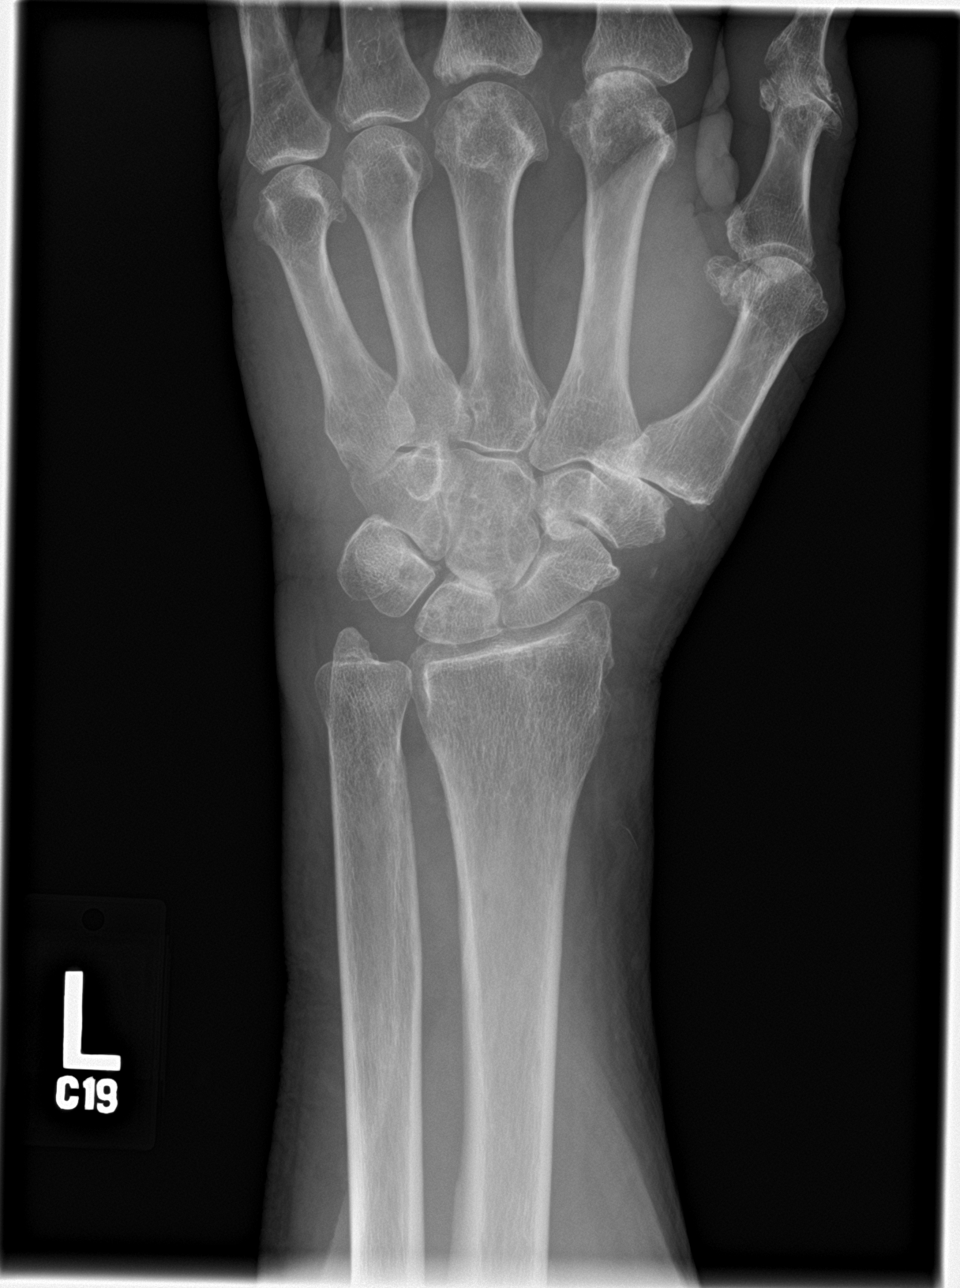

[wrist lat]
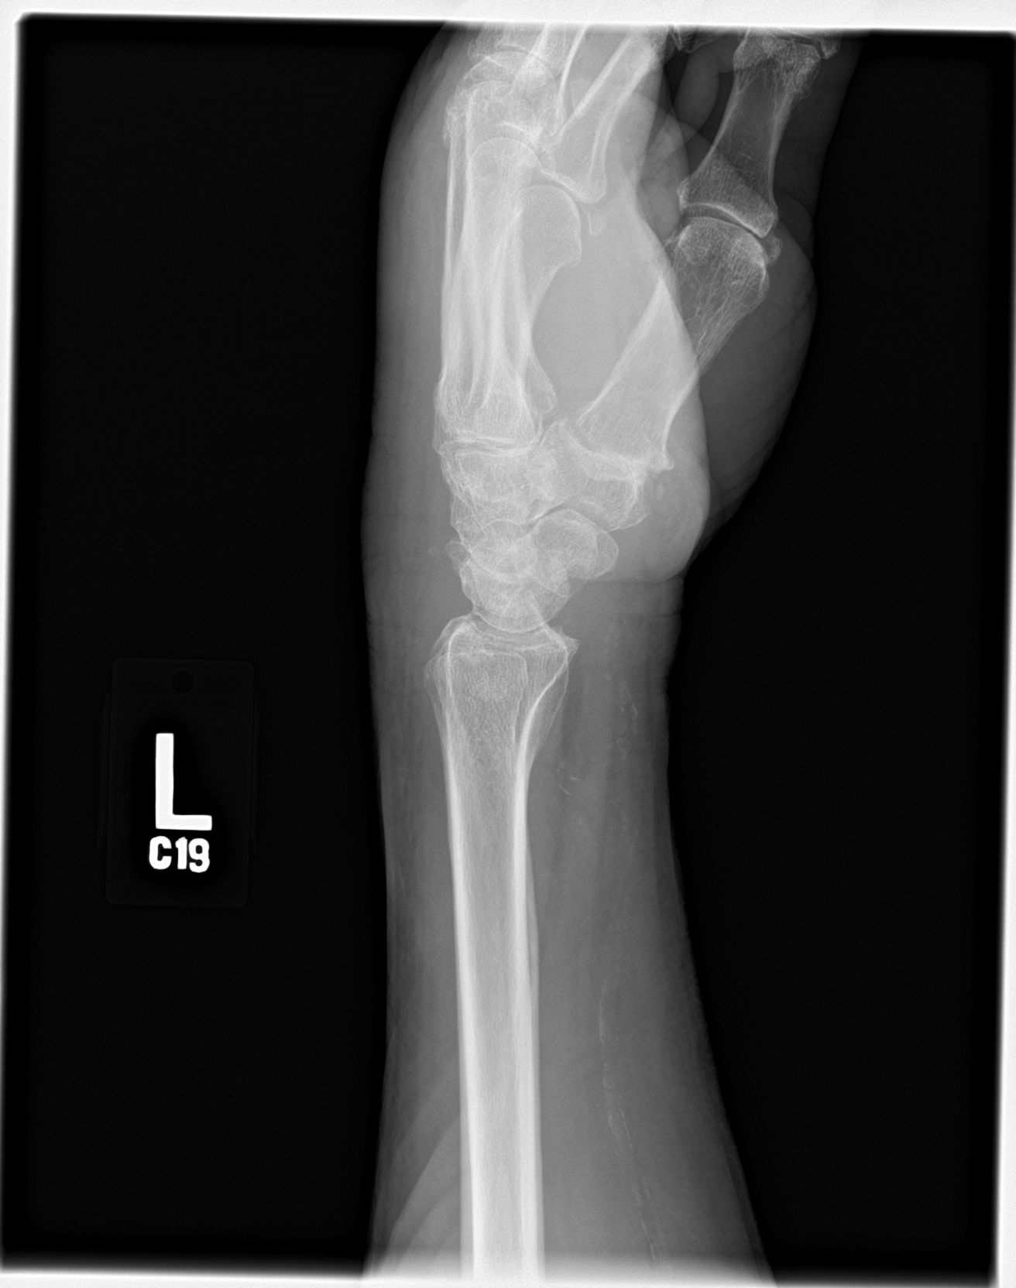

[wrist navicular]
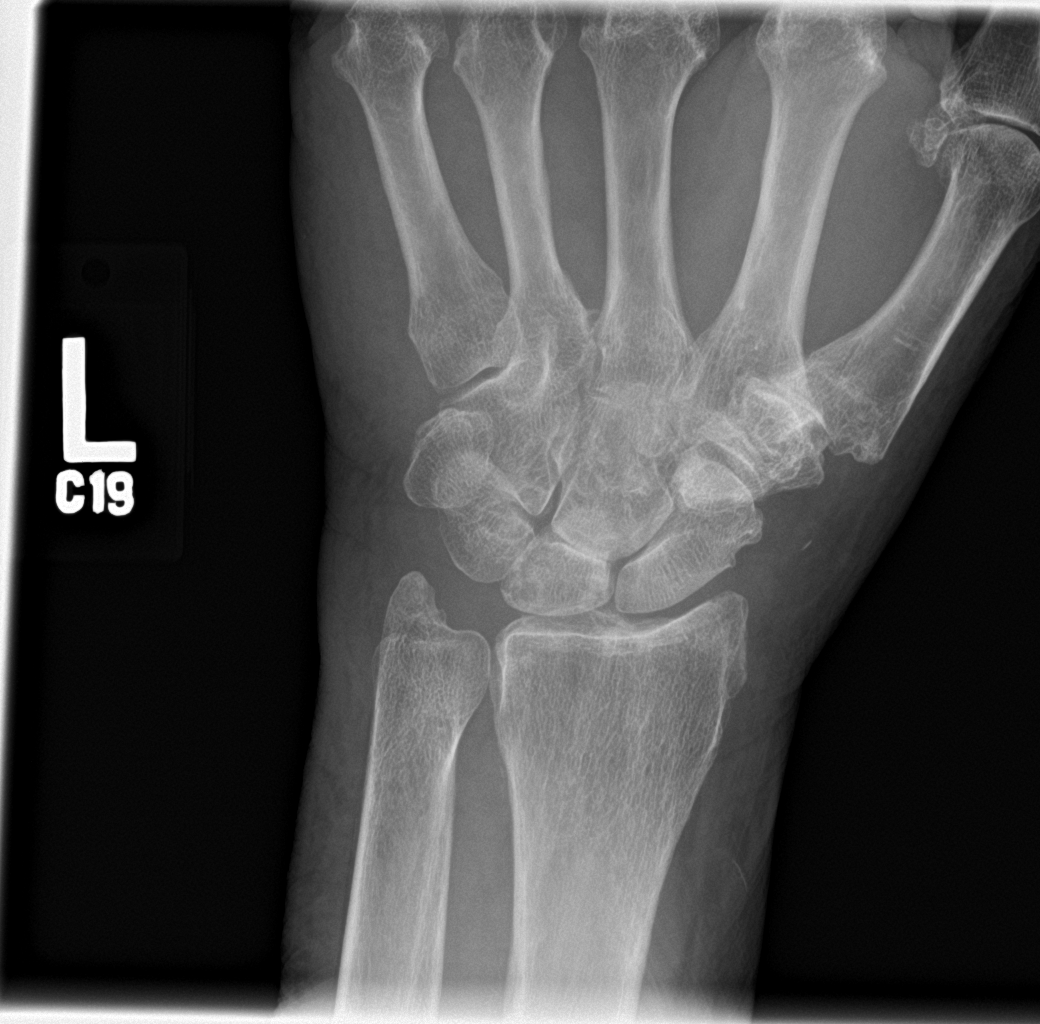

[wrist obl]
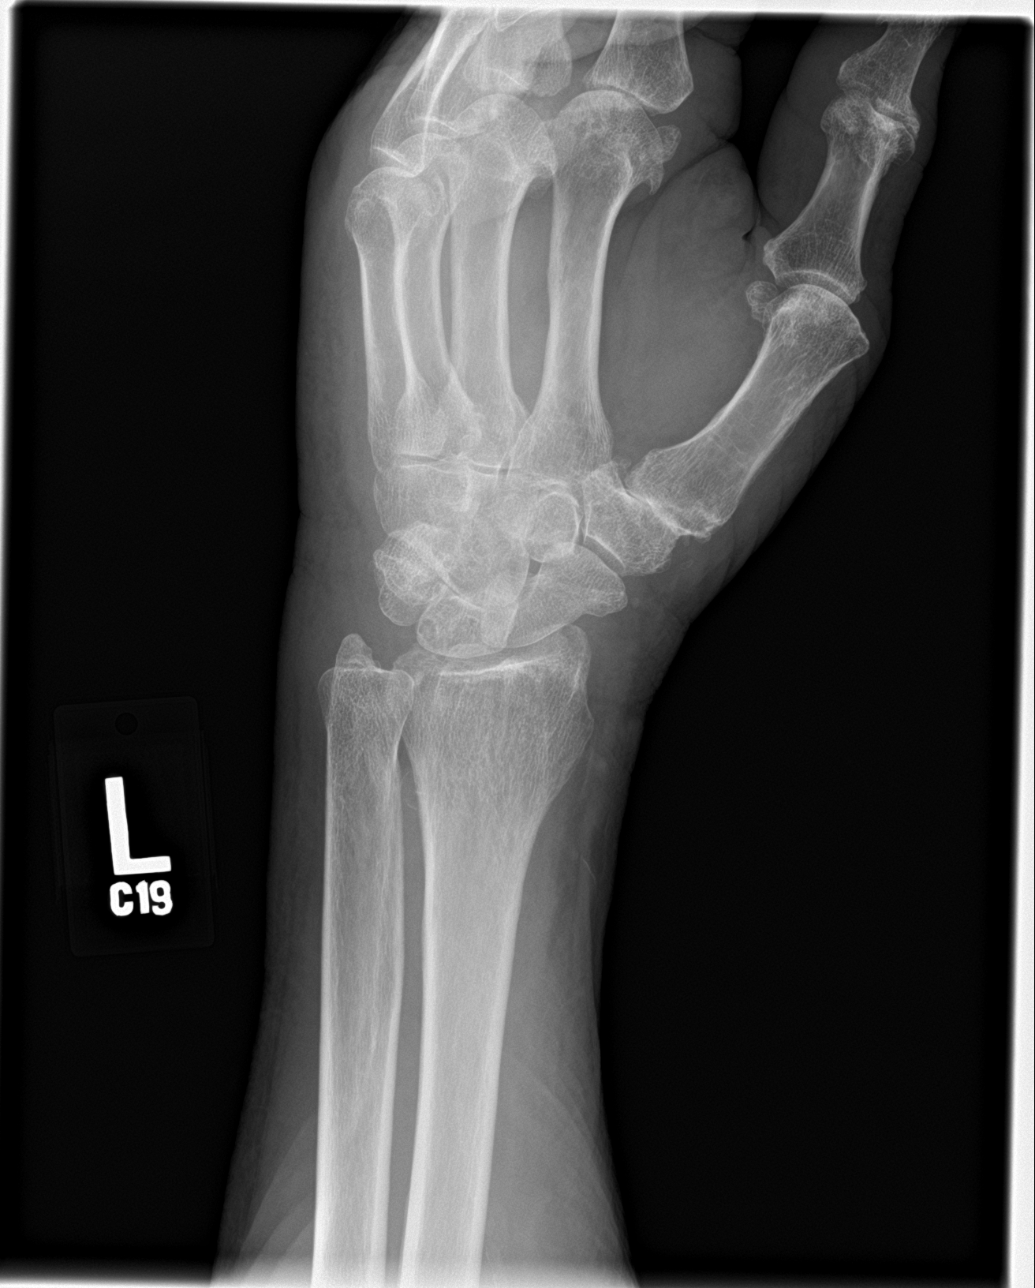

[4 of 4 positions shown; findings below may reference images not displayed]

FINDINGS: No evidence of acute or subacute fracture or dislocation. Cystic
changes in the lunate, capitate, cystic changes throughout the
carpal bones, most prominent in the capitate bone, progressive since
the prior examination in [id number]. Similar subchondral cystic changes
are present in the head of the second and third metacarpal.
Narrowing of the trapezium-first metacarpal joint space, the
scapholunate joint space and the lunate-capitate joint space.
Remaining joint spaces well-preserved. Bone mineral density
well-preserved. Soft tissue swelling overlying the wrist. Joint
effusion suspected.
IMPRESSION: No acute or subacute osseous abnormality. Possible crystalline or
inflammatory arthropathy involving the LEFT wrist.

## 2020-01-25 IMAGING — CR CHEST - 2 VIEW
2 series · 2 of 2 positions shown · non-contrast
Comparison: 10/22/2018, 07/10/2018

CLINICAL DATA: Fall

EXAM:
CHEST - 2 VIEW

[chest lat]
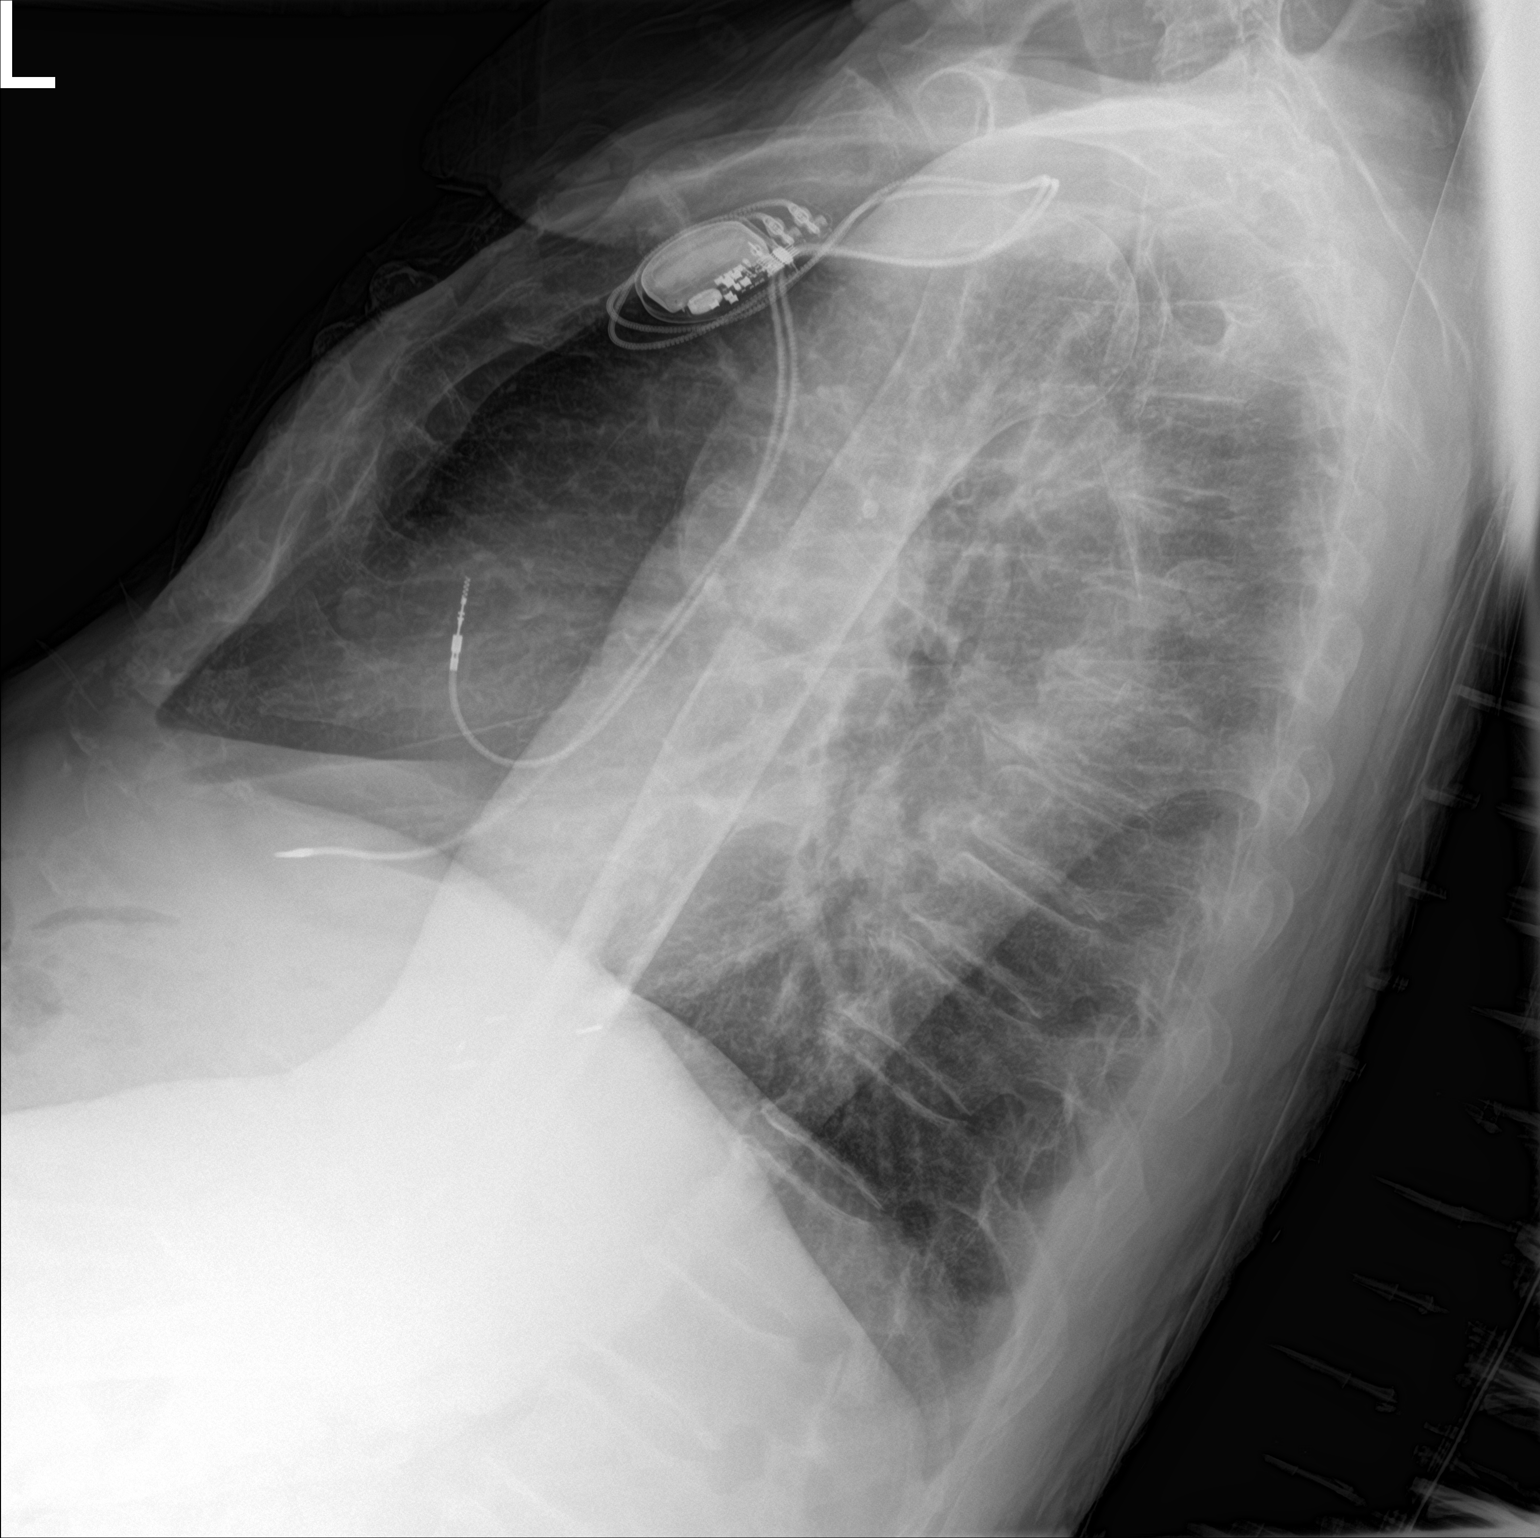

[chest ap]
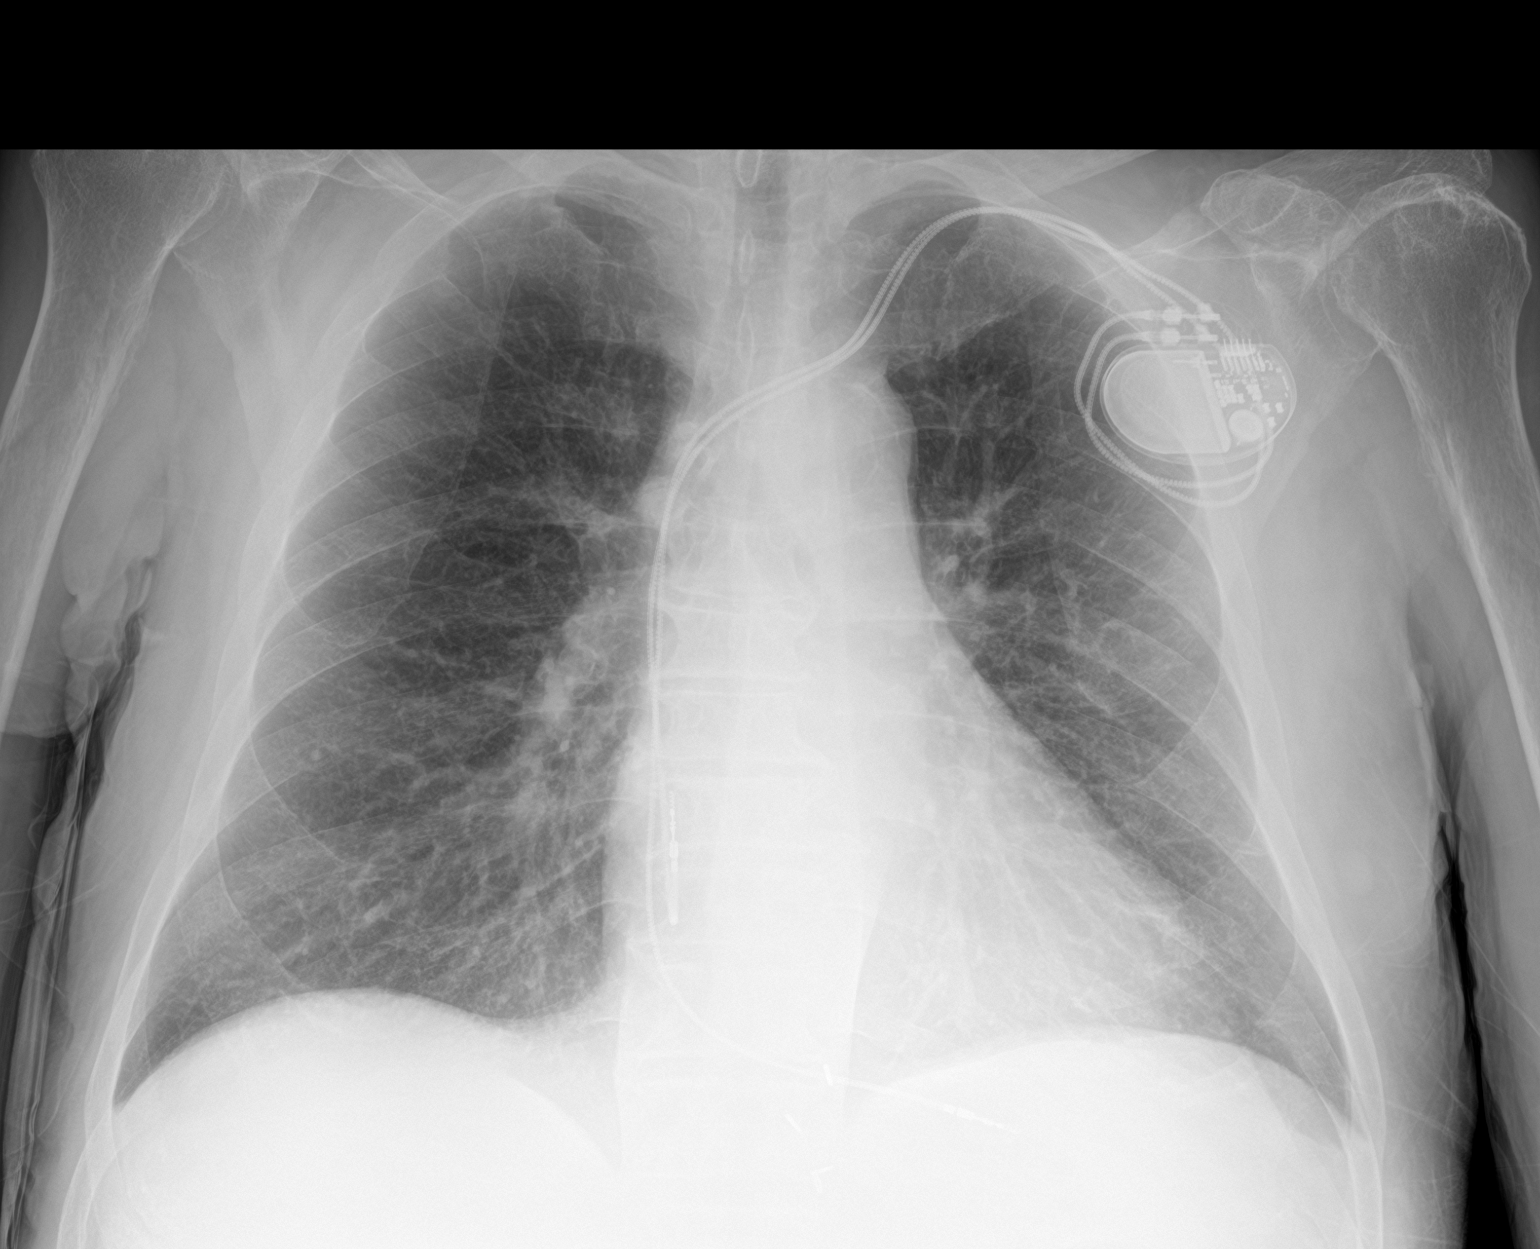

[2 of 2 positions shown; findings below may reference images not displayed]

FINDINGS: Left-sided pacing device as before. Coarse chronic interstitial
opacity. No consolidation or effusion. Stable cardiomediastinal
silhouette. Aortic atherosclerosis. No pneumothorax.
IMPRESSION: No active cardiopulmonary disease. Chronic interstitial lung
opacities.

## 2020-10-07 IMAGING — DX DG HIP (WITH OR WITHOUT PELVIS) 1V*L*
3 series · 3 of 3 positions shown · non-contrast
Comparison: 04/15/2018

CLINICAL DATA: Fall, left hip pain

EXAM:
DG HIP (WITH OR WITHOUT PELVIS) 1V*L*

[pelvis ap]
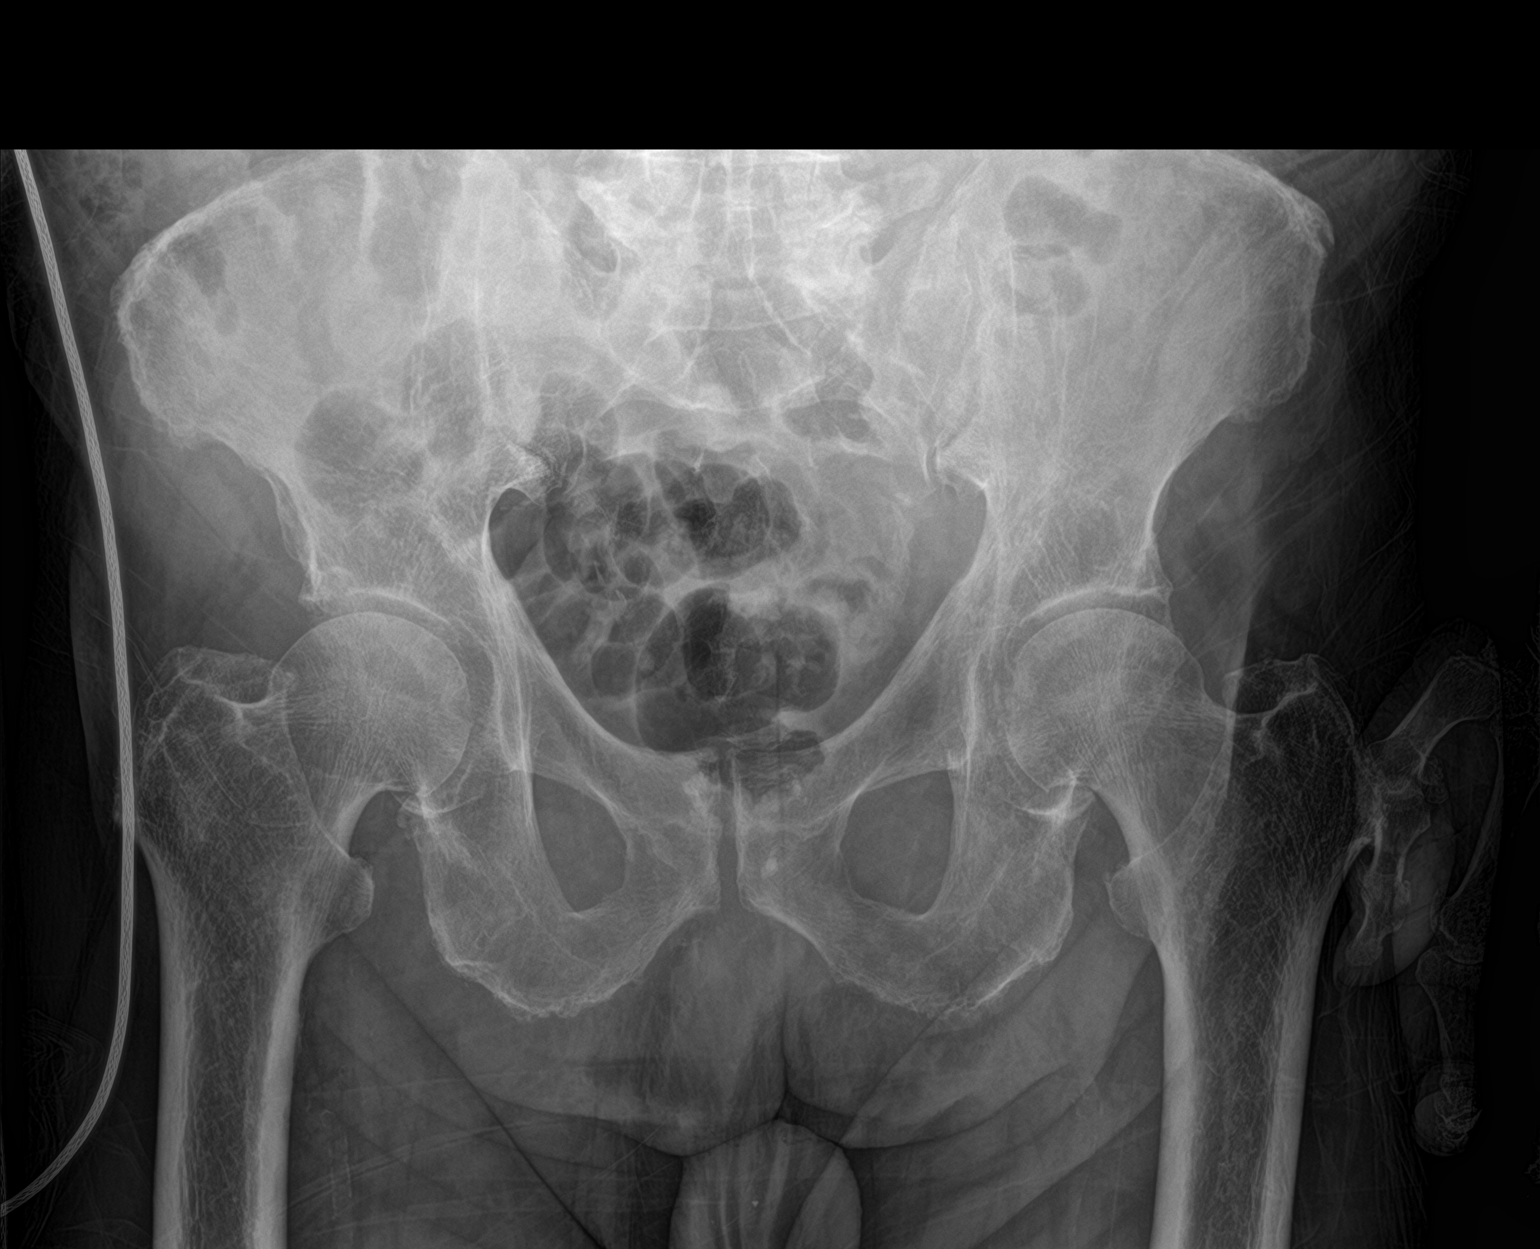

[hip ap]
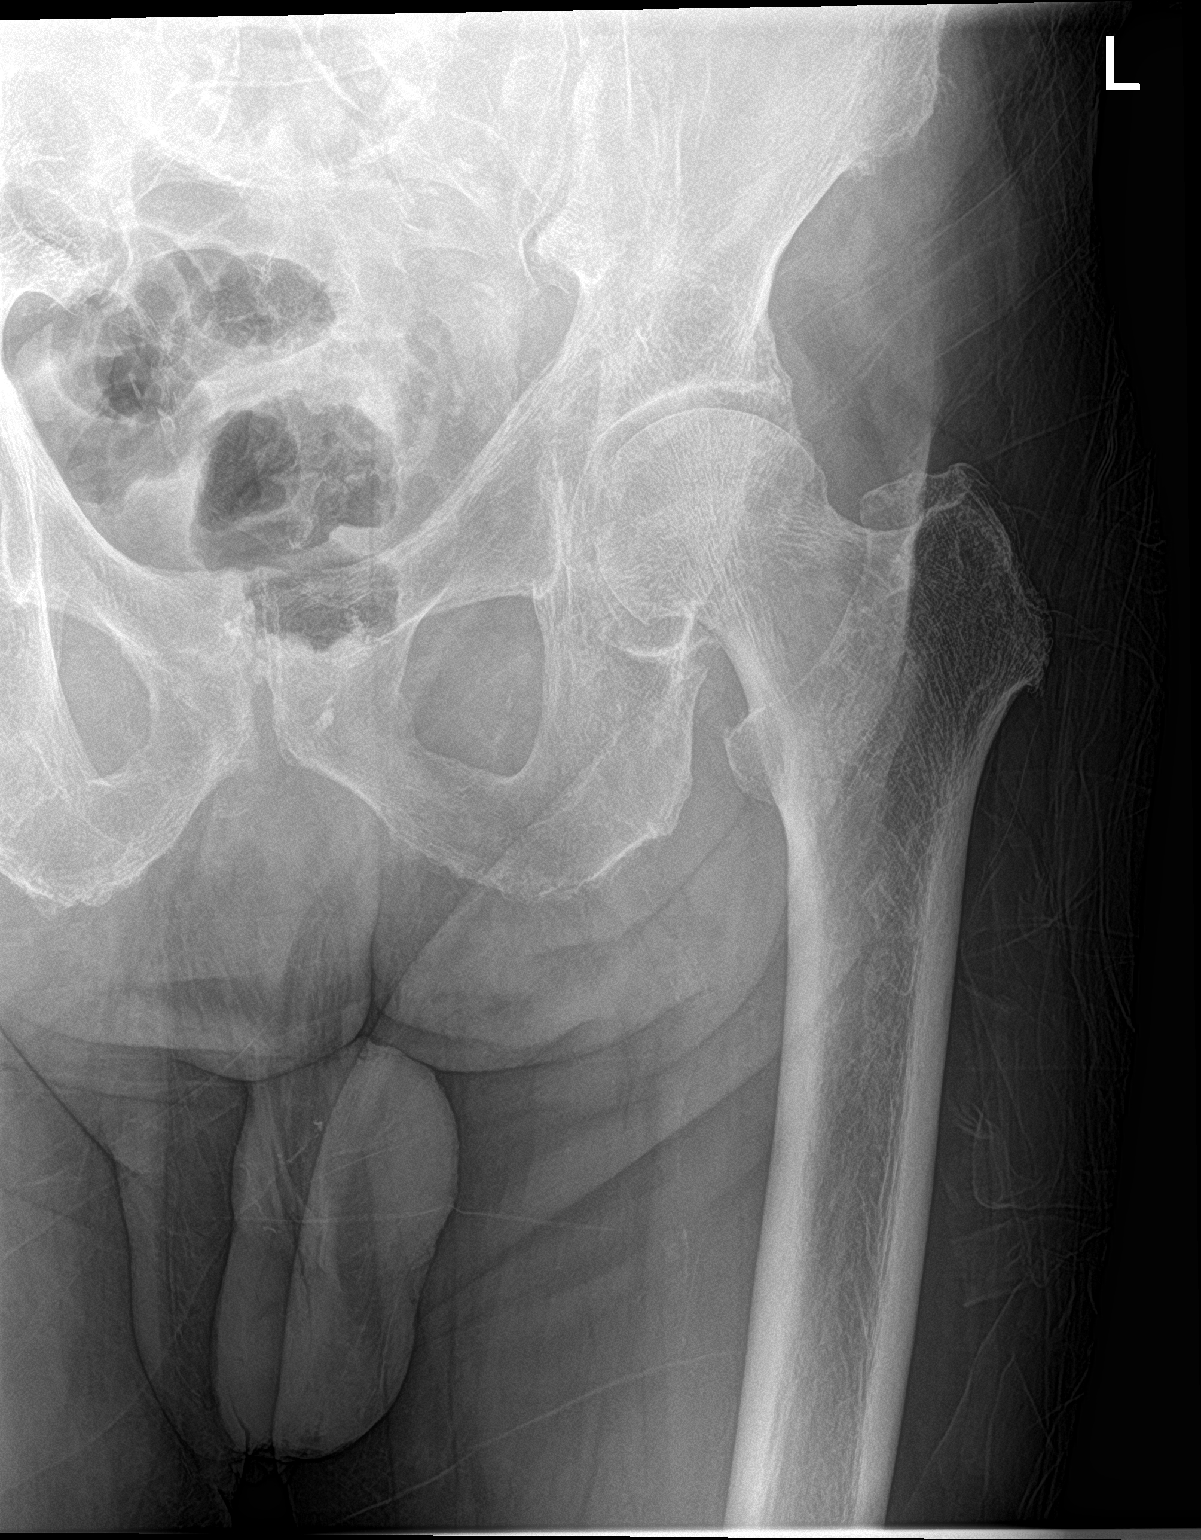

[hip lat]
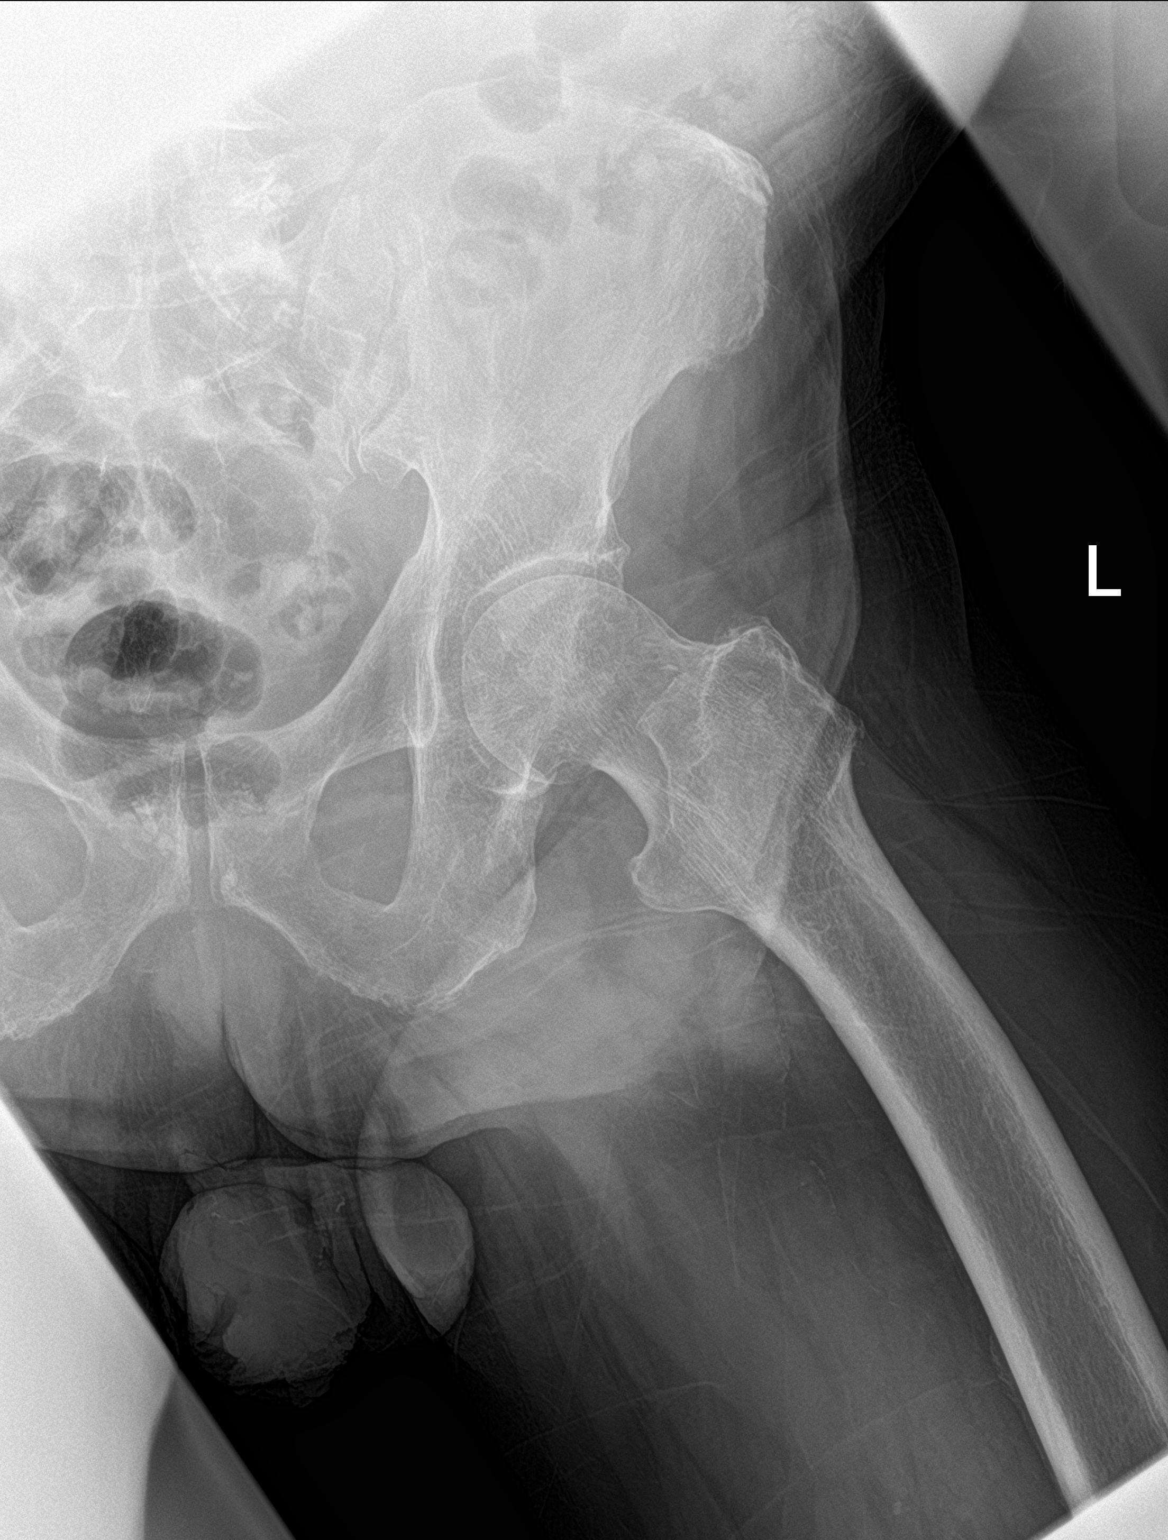

[3 of 3 positions shown; findings below may reference images not displayed]

FINDINGS: Hip joints are symmetric. Joint spaces maintained. No acute bony
abnormality. Specifically, no fracture, subluxation, or dislocation.
IMPRESSION: No acute bony abnormality.

## 2020-10-07 IMAGING — CT CT CERVICAL SPINE W/O CM
3 of 4 series · 12 of 33 positions shown, 14 images · non-contrast
Comparison: CT dated 11/14/2018.

CLINICAL DATA: 79-year-old male with trauma.

EXAM:
CT HEAD WITHOUT CONTRAST
CT CERVICAL SPINE WITHOUT CONTRAST
TECHNIQUE: Multidetector CT imaging of the head and cervical spine was
performed following the standard protocol without intravenous
contrast. Multiplanar CT image reconstructions of the cervical spine
were also generated.

[Series 5: c_spine 2.0 st · axial · 0.24mm/px · z∈[+1325,+1433]mm · 4 of 82 slices shown, 5 images]
[im 14/82  soft-tissue]
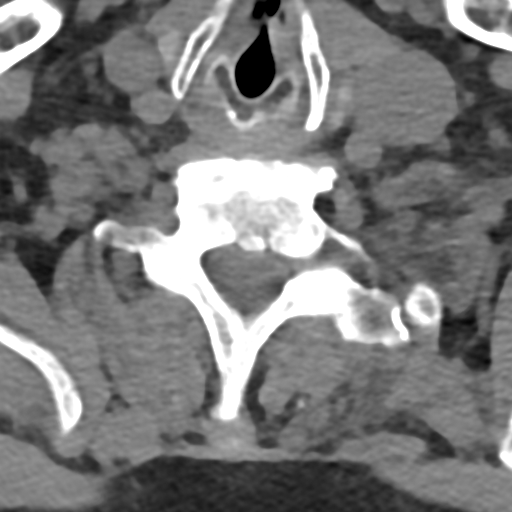
[im 14/82  bone]
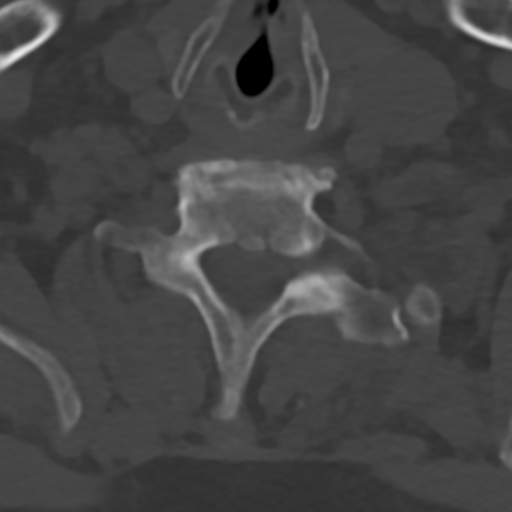
[im 28/82  bone]
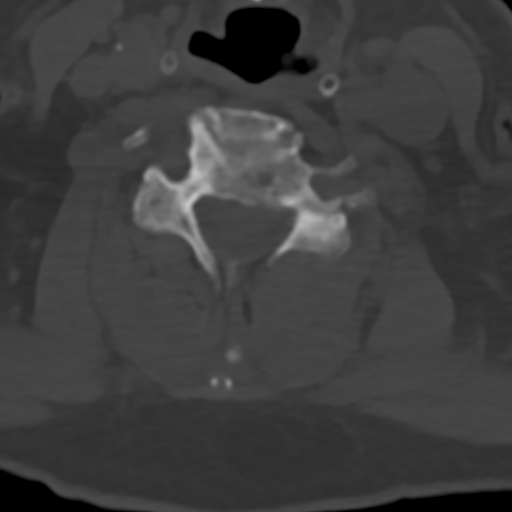
[im 55/82  bone]
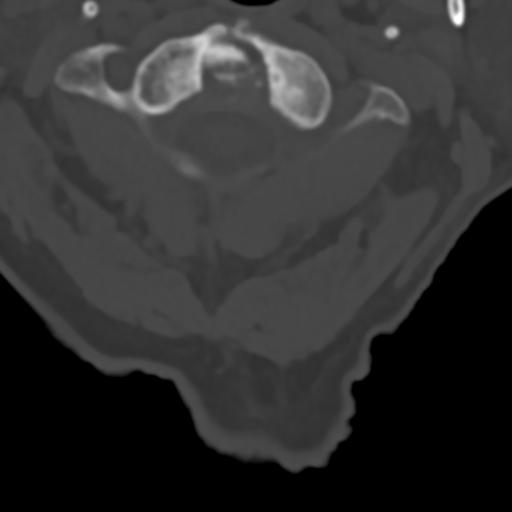
[im 68/82  bone]
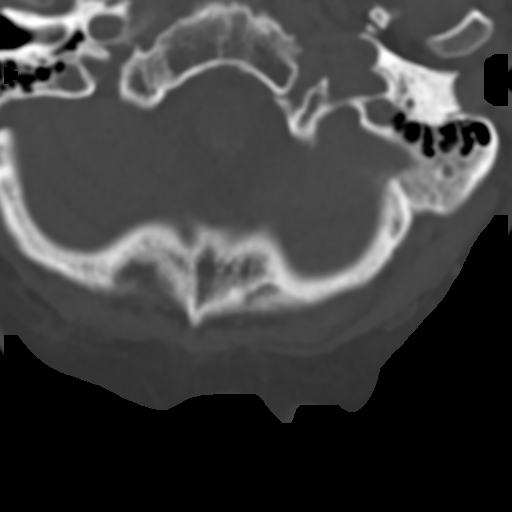

[Series 7: c_spine 2.0 sag bone · sagittal · 0.24mm/px · 5 of 52 slices shown, 6 images]
[im 18/52  bone]
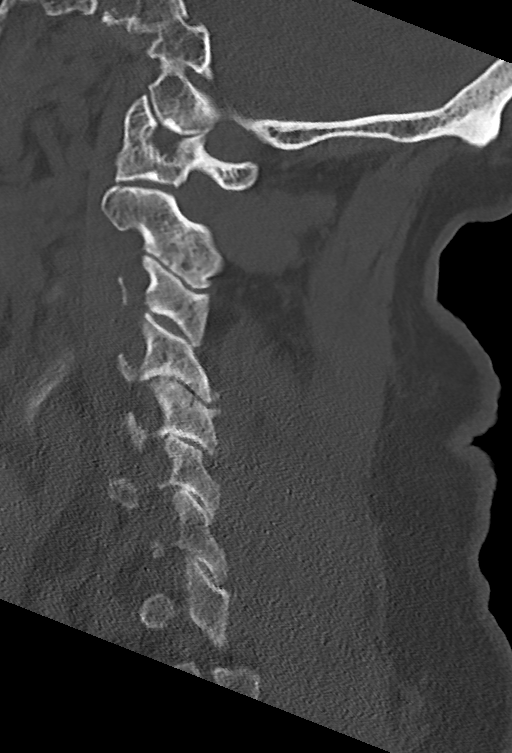
[im 22/52  bone]
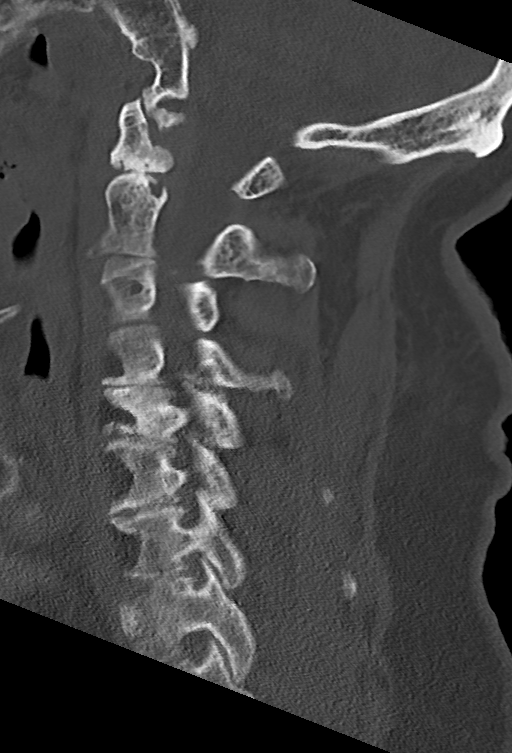
[im 26/52  soft-tissue]
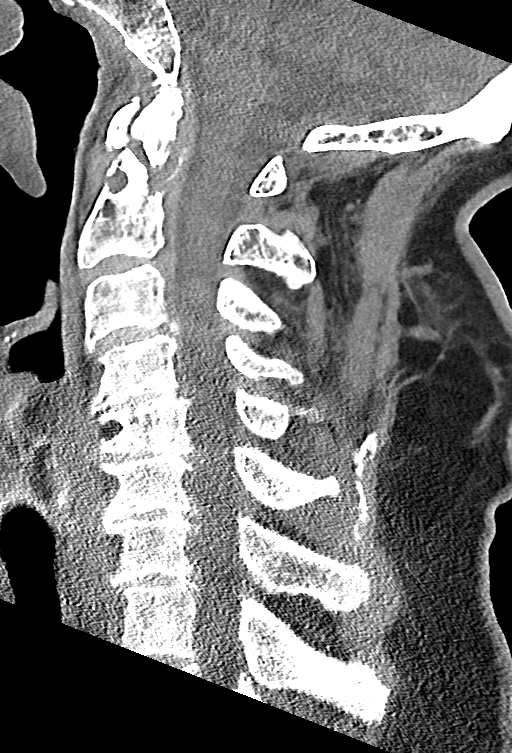
[im 26/52  bone]
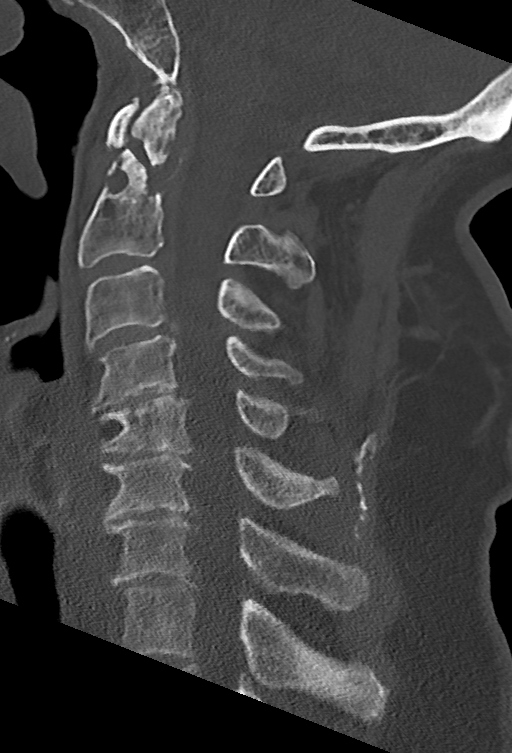
[im 30/52  bone]
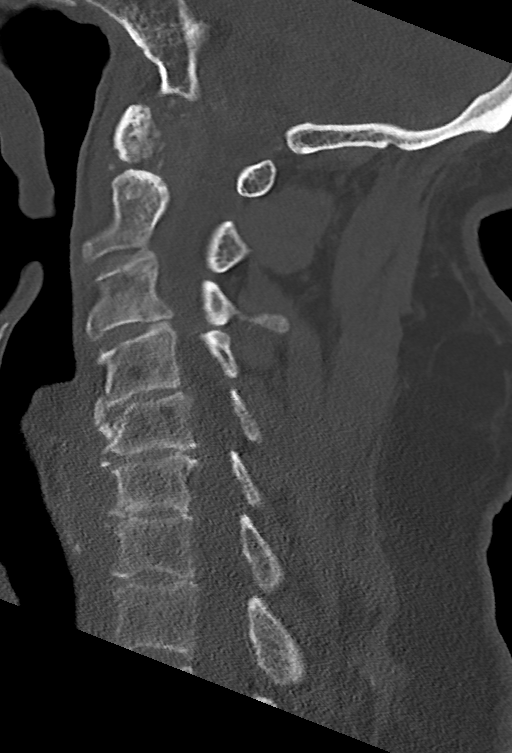
[im 35/52  bone]
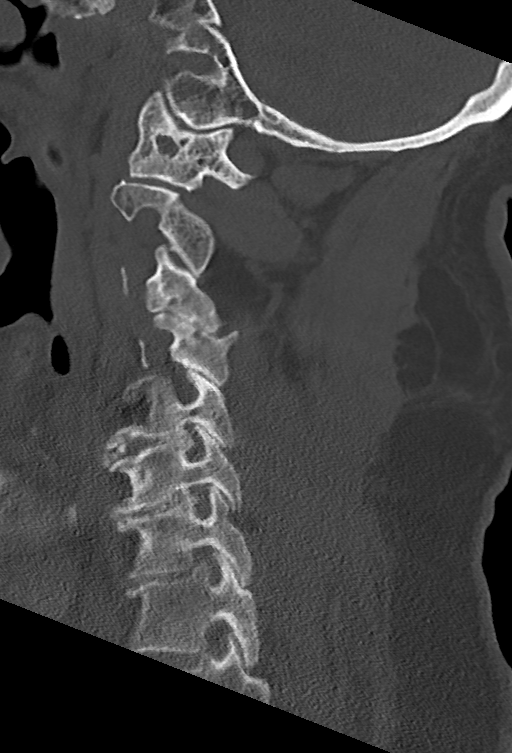

[Series 8: c_spine 2.0 cor bone · coronal · 0.23mm/px · 3 of 58 slices shown]
[im 12/58  bone]
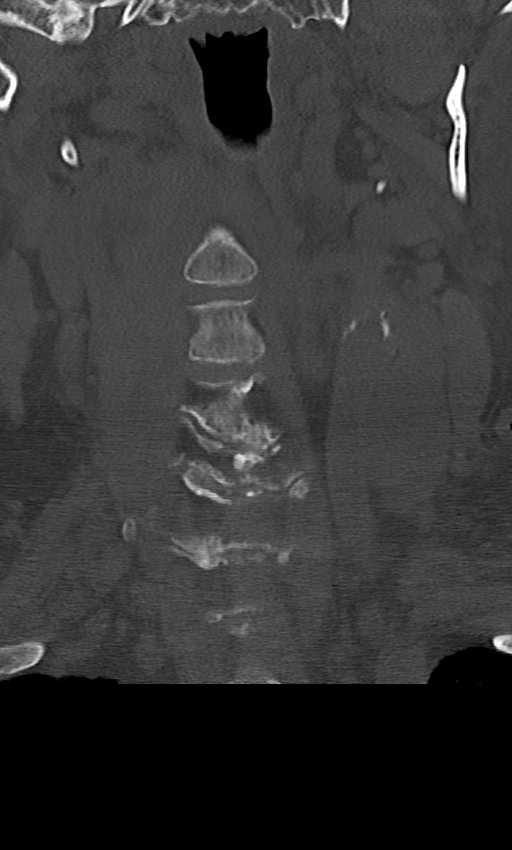
[im 23/58  bone]
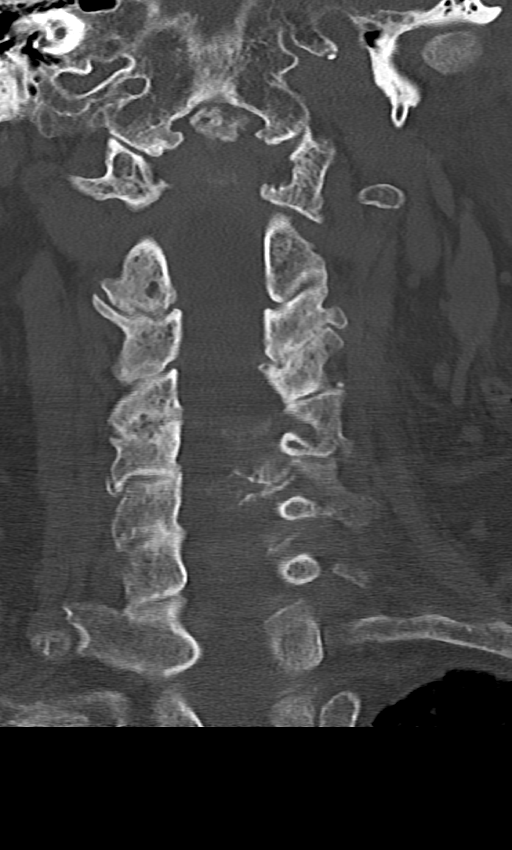
[im 35/58  bone]
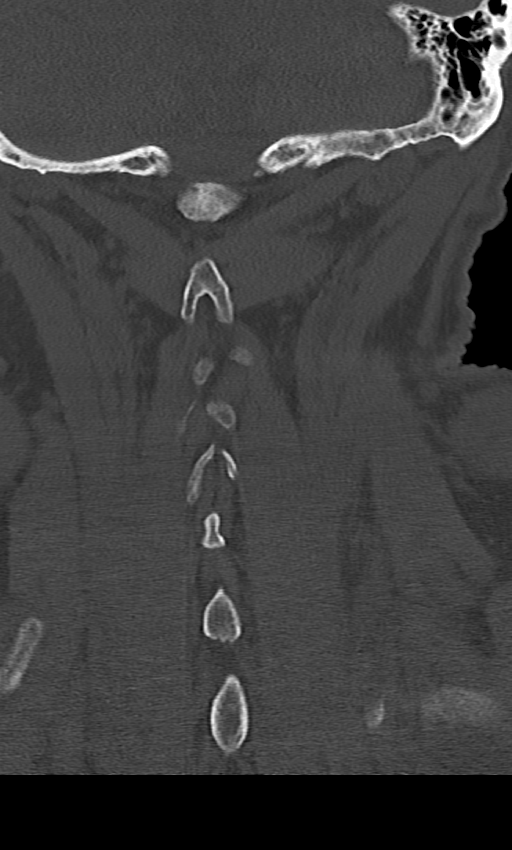

[12 of 33 positions shown; findings below may reference images not displayed]

FINDINGS: CT HEAD FINDINGS

Brain: Moderate age-related atrophy and chronic microvascular
ischemic changes. There is no acute intracranial hemorrhage. No mass
effect or midline shift. No extra-axial fluid collection.

Vascular: No hyperdense vessel or unexpected calcification.

Skull: Normal. Negative for fracture or focal lesion.

Sinuses/Orbits: No acute finding.

Other: None.

CT CERVICAL SPINE FINDINGS

Alignment: No acute subluxation. There is straightening of normal
cervical lordosis which may be positional or due to muscle spasm or
chronic.

Skull base and vertebrae: No acute fracture. Old fracture of the
dens with nonunion and 3-4 mm distraction. There has been interval
increase in the distraction gap since the prior CT.

Soft tissues and spinal canal: No prevertebral fluid or swelling. No
visible canal hematoma.

Disc levels:  Multilevel degenerative changes with bone spurring.

Upper chest: Negative.

Other: Partially visualized pacemaker wire. Bilateral carotid bulb
calcified plaques. Probable subcutaneous lipoma of the posterior
neck.
IMPRESSION: 1. No acute intracranial pathology. Moderate age-related atrophy and
chronic microvascular ischemic changes.
2. No acute/traumatic cervical spine pathology. Old fracture of the
dens with nonunion and increased distraction.

## 2020-10-07 IMAGING — CT CT HEAD W/O CM
4 series · 15 of 47 positions shown, 17 images · non-contrast
Comparison: CT dated 11/14/2018.

CLINICAL DATA: 79-year-old male with trauma.

EXAM:
CT HEAD WITHOUT CONTRAST
CT CERVICAL SPINE WITHOUT CONTRAST
TECHNIQUE: Multidetector CT imaging of the head and cervical spine was
performed following the standard protocol without intravenous
contrast. Multiplanar CT image reconstructions of the cervical spine
were also generated.

[Series 3: head without · axial · non-contrast · 0.43mm/px · z∈[+1426,+1552]mm · 7 of 35 slices shown, 9 images]
[im 5/35  brain]
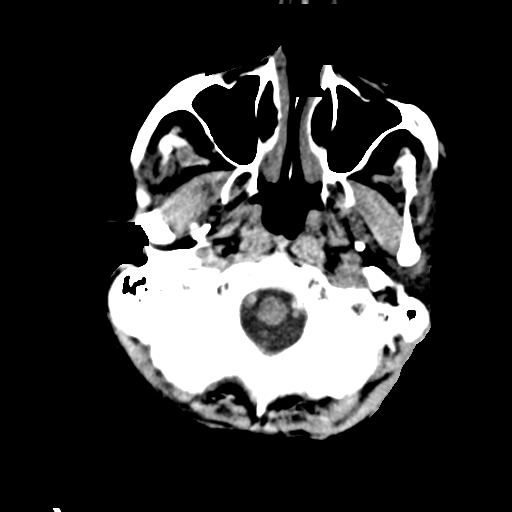
[im 5/35  bone]
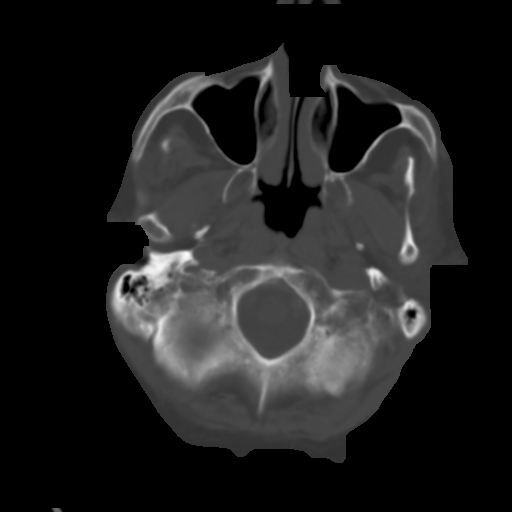
[im 9/35  brain]
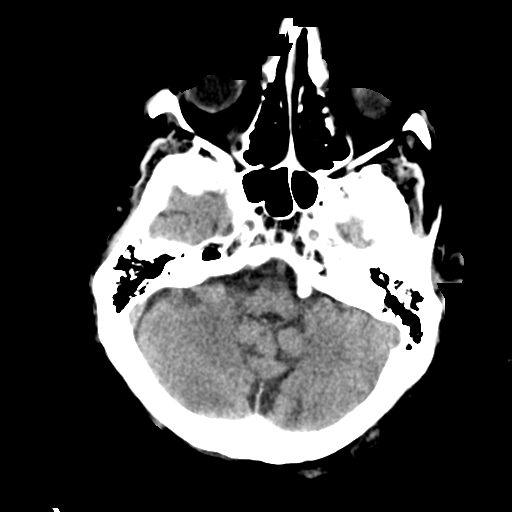
[im 13/35  brain]
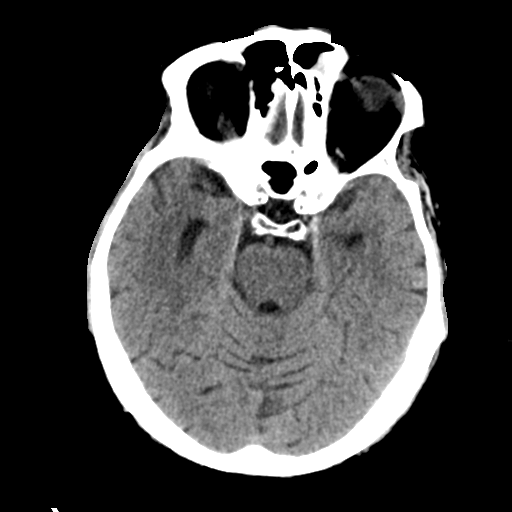
[im 18/35  brain]
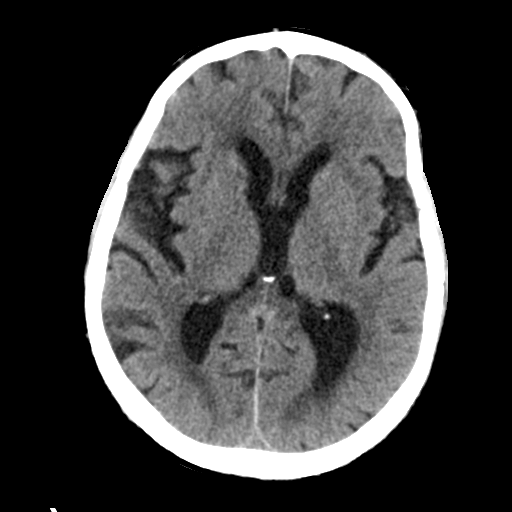
[im 22/35  brain]
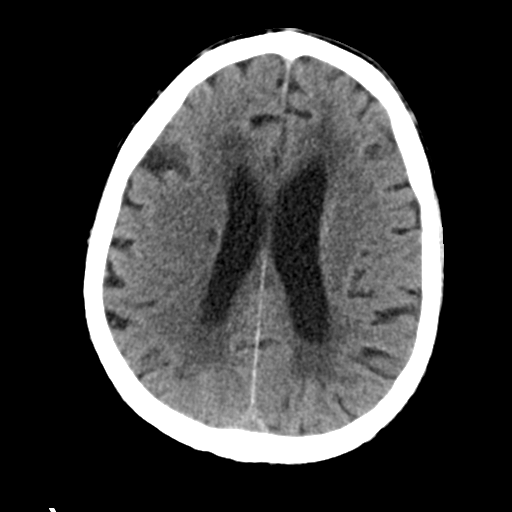
[im 22/35  bone]
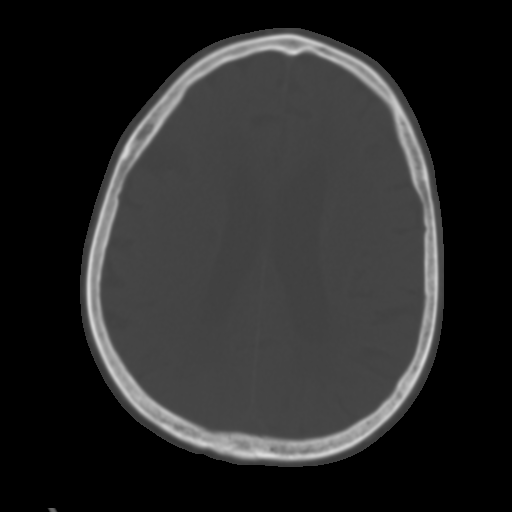
[im 26/35  brain]
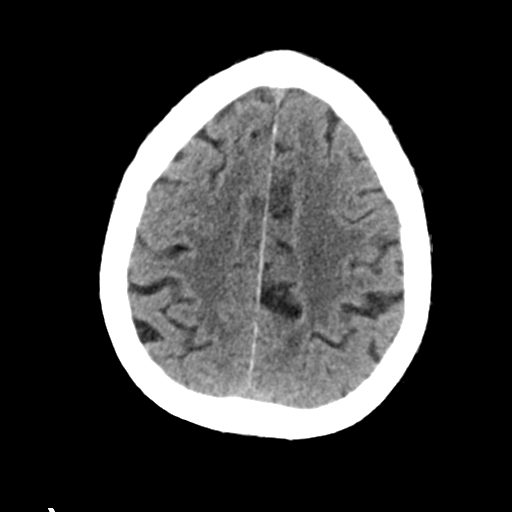
[im 30/35  brain]
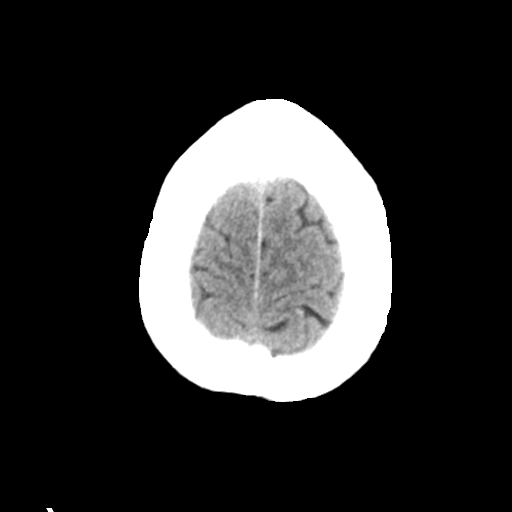

[Series 4: head bone · axial · 0.43mm/px · z∈[+1422,+1440]mm · 2 of 87 slices shown]
[im 9/87  bone]
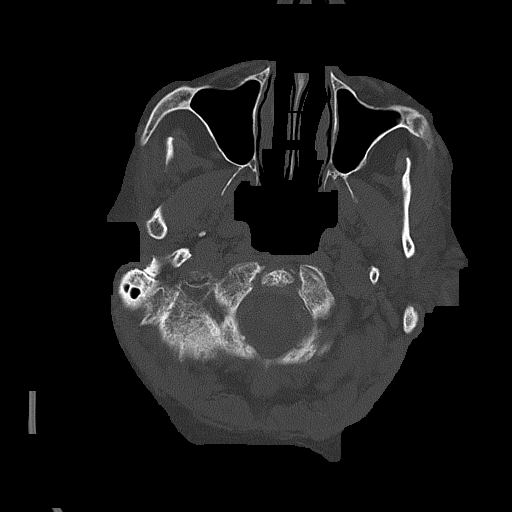
[im 18/87  bone]
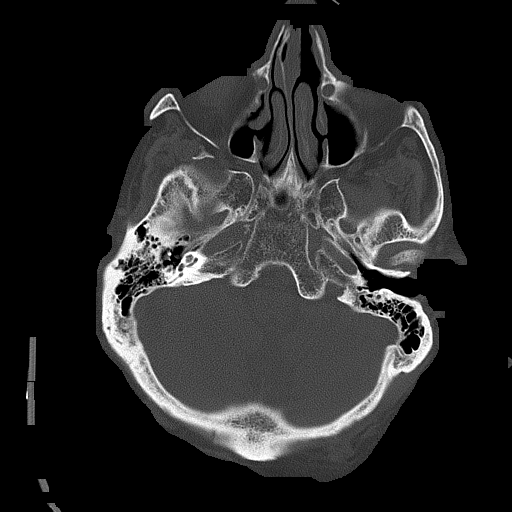

[Series 5: head without cor · coronal · non-contrast · 0.34mm/px · 3 of 72 slices shown]
[im 24/72  brain]
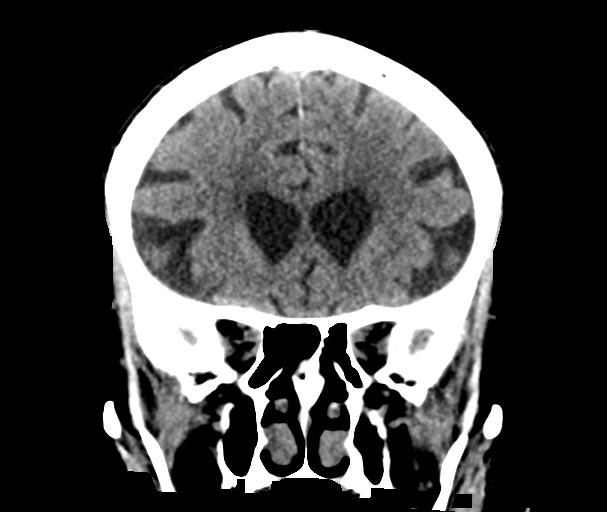
[im 32/72  brain]
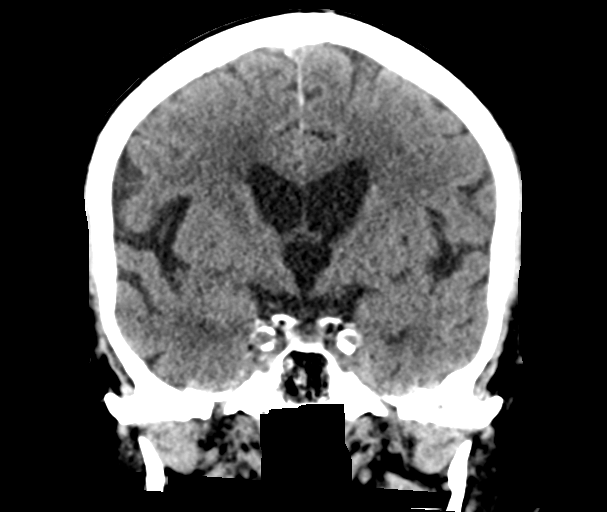
[im 40/72  brain]
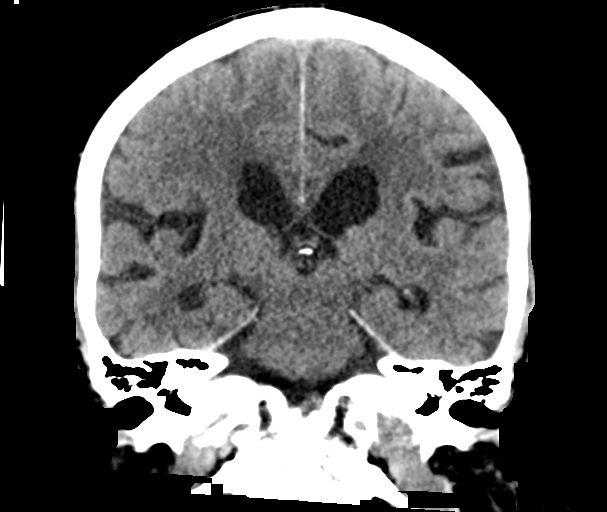

[Series 6: head without sag · sagittal · non-contrast · 0.34mm/px · 3 of 62 slices shown]
[im 21/62  brain]
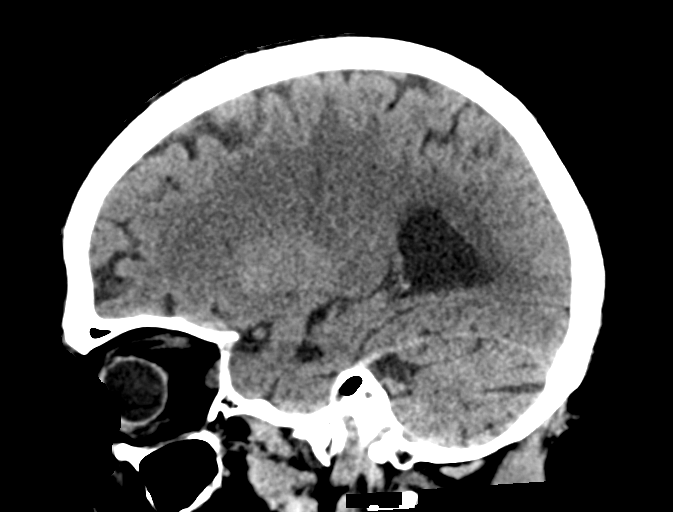
[im 31/62  brain]
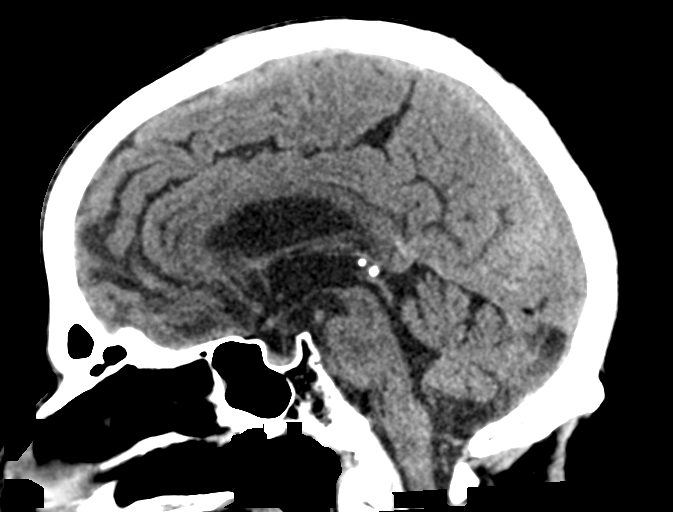
[im 41/62  brain]
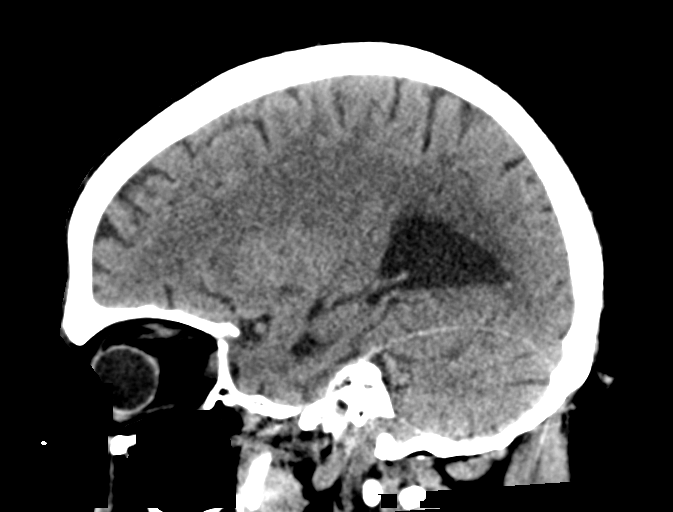

[15 of 47 positions shown; findings below may reference images not displayed]

FINDINGS: CT HEAD FINDINGS

Brain: Moderate age-related atrophy and chronic microvascular
ischemic changes. There is no acute intracranial hemorrhage. No mass
effect or midline shift. No extra-axial fluid collection.

Vascular: No hyperdense vessel or unexpected calcification.

Skull: Normal. Negative for fracture or focal lesion.

Sinuses/Orbits: No acute finding.

Other: None.

CT CERVICAL SPINE FINDINGS

Alignment: No acute subluxation. There is straightening of normal
cervical lordosis which may be positional or due to muscle spasm or
chronic.

Skull base and vertebrae: No acute fracture. Old fracture of the
dens with nonunion and 3-4 mm distraction. There has been interval
increase in the distraction gap since the prior CT.

Soft tissues and spinal canal: No prevertebral fluid or swelling. No
visible canal hematoma.

Disc levels:  Multilevel degenerative changes with bone spurring.

Upper chest: Negative.

Other: Partially visualized pacemaker wire. Bilateral carotid bulb
calcified plaques. Probable subcutaneous lipoma of the posterior
neck.
IMPRESSION: 1. No acute intracranial pathology. Moderate age-related atrophy and
chronic microvascular ischemic changes.
2. No acute/traumatic cervical spine pathology. Old fracture of the
dens with nonunion and increased distraction.

## 2020-10-07 IMAGING — DX DG RIBS W/ CHEST 3+V*L*
3 series · 3 of 3 positions shown · non-contrast
Comparison: None.

CLINICAL DATA: Multiple recent falls.  Left hip, rib and chest pain

EXAM:
LEFT RIBS AND CHEST - 3+ VIEW

[rib ap]
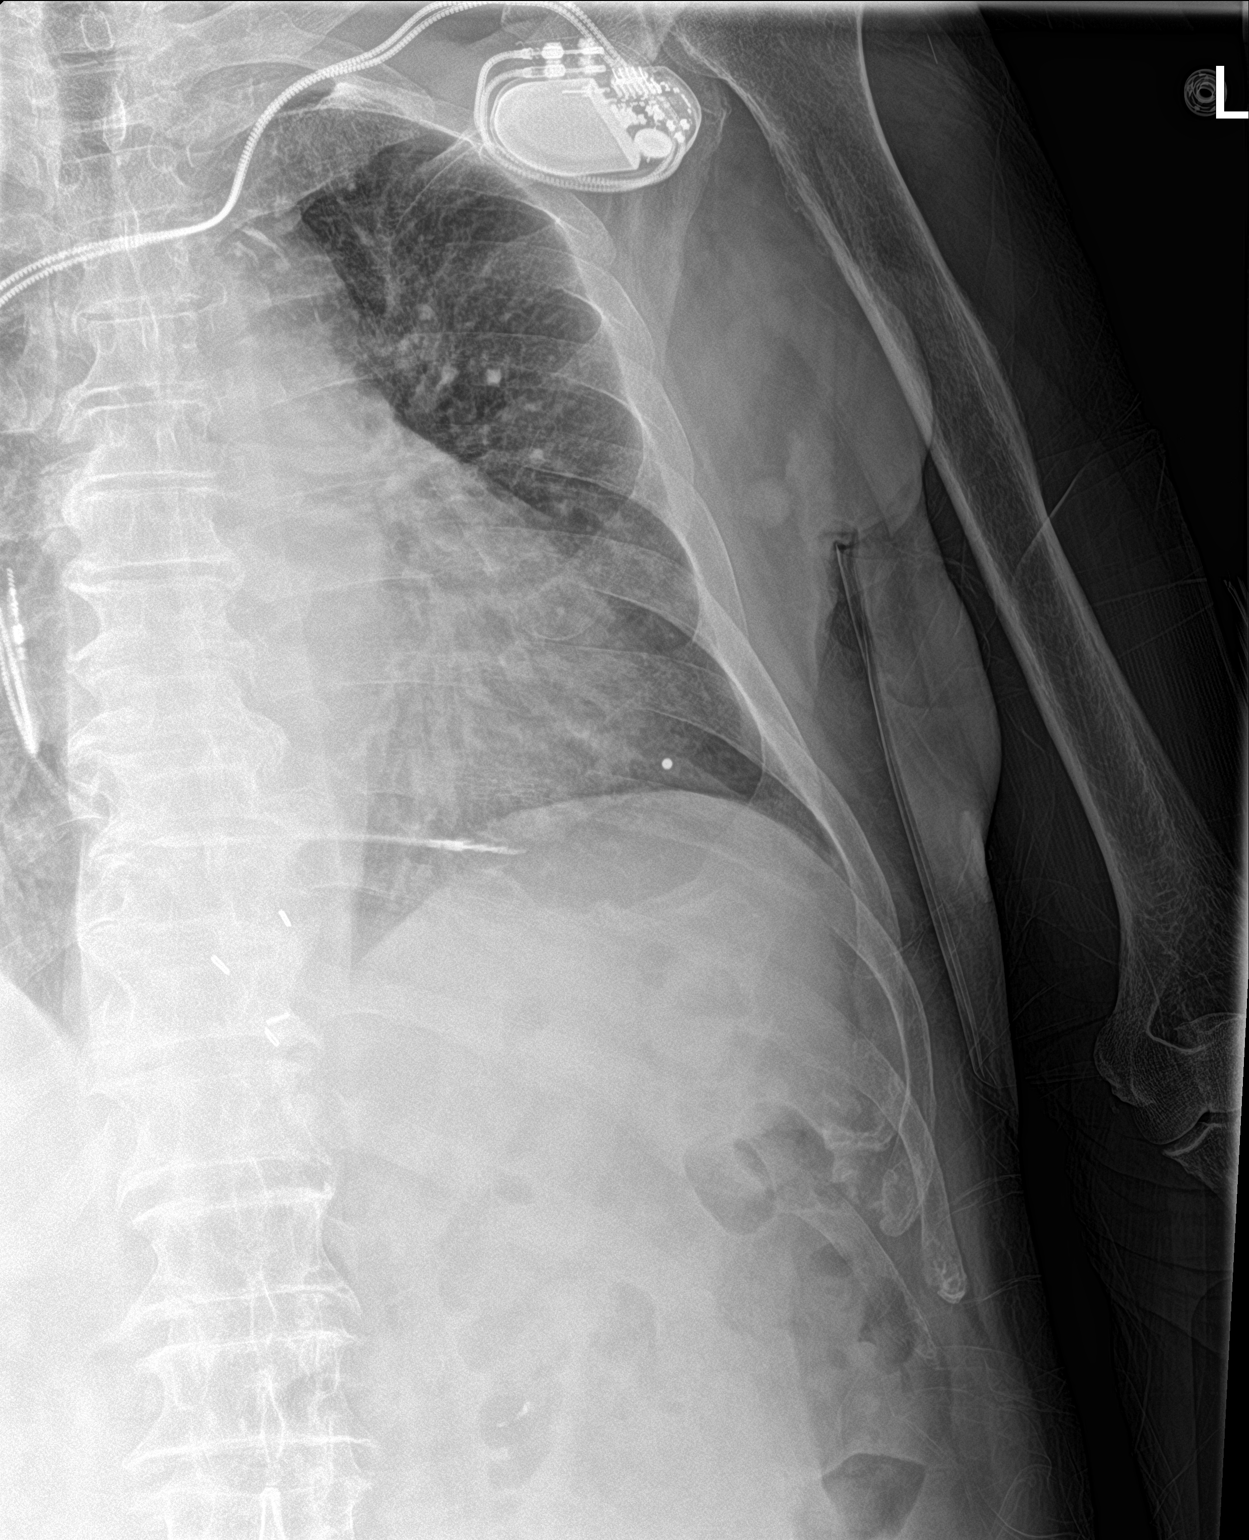

[rib ap obl]
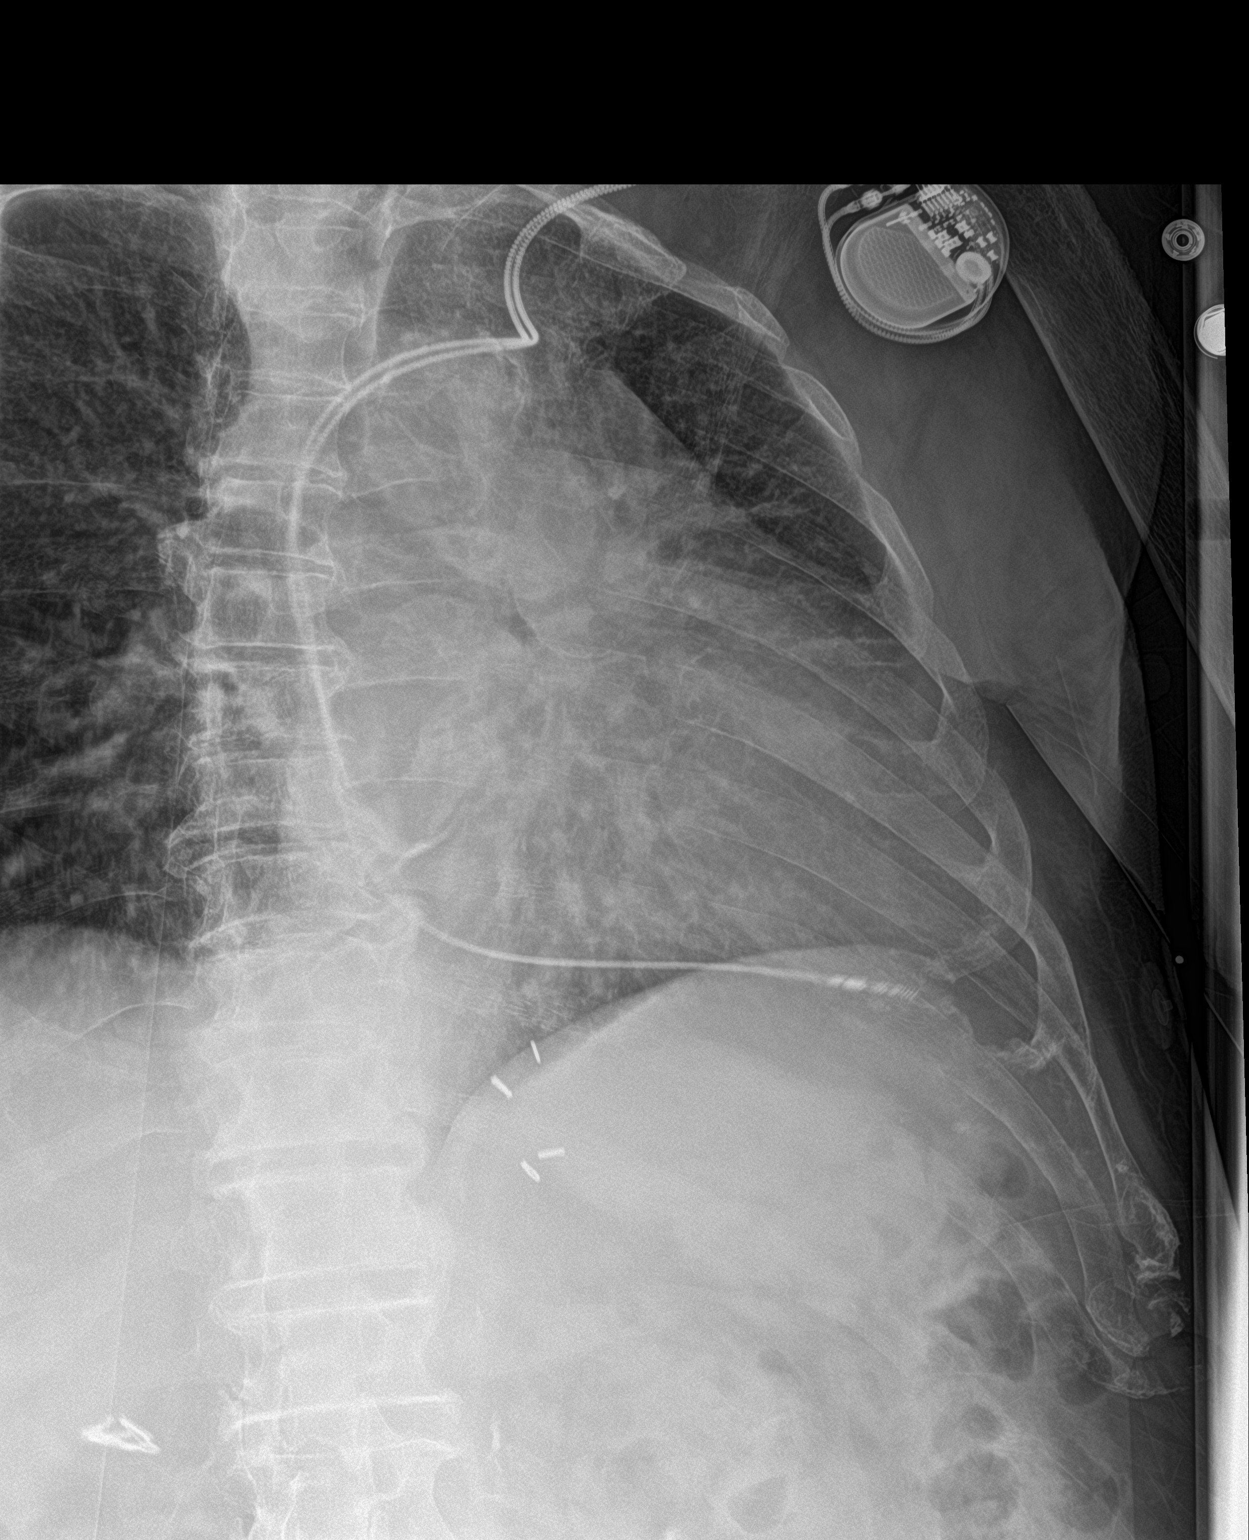

[chest ap]
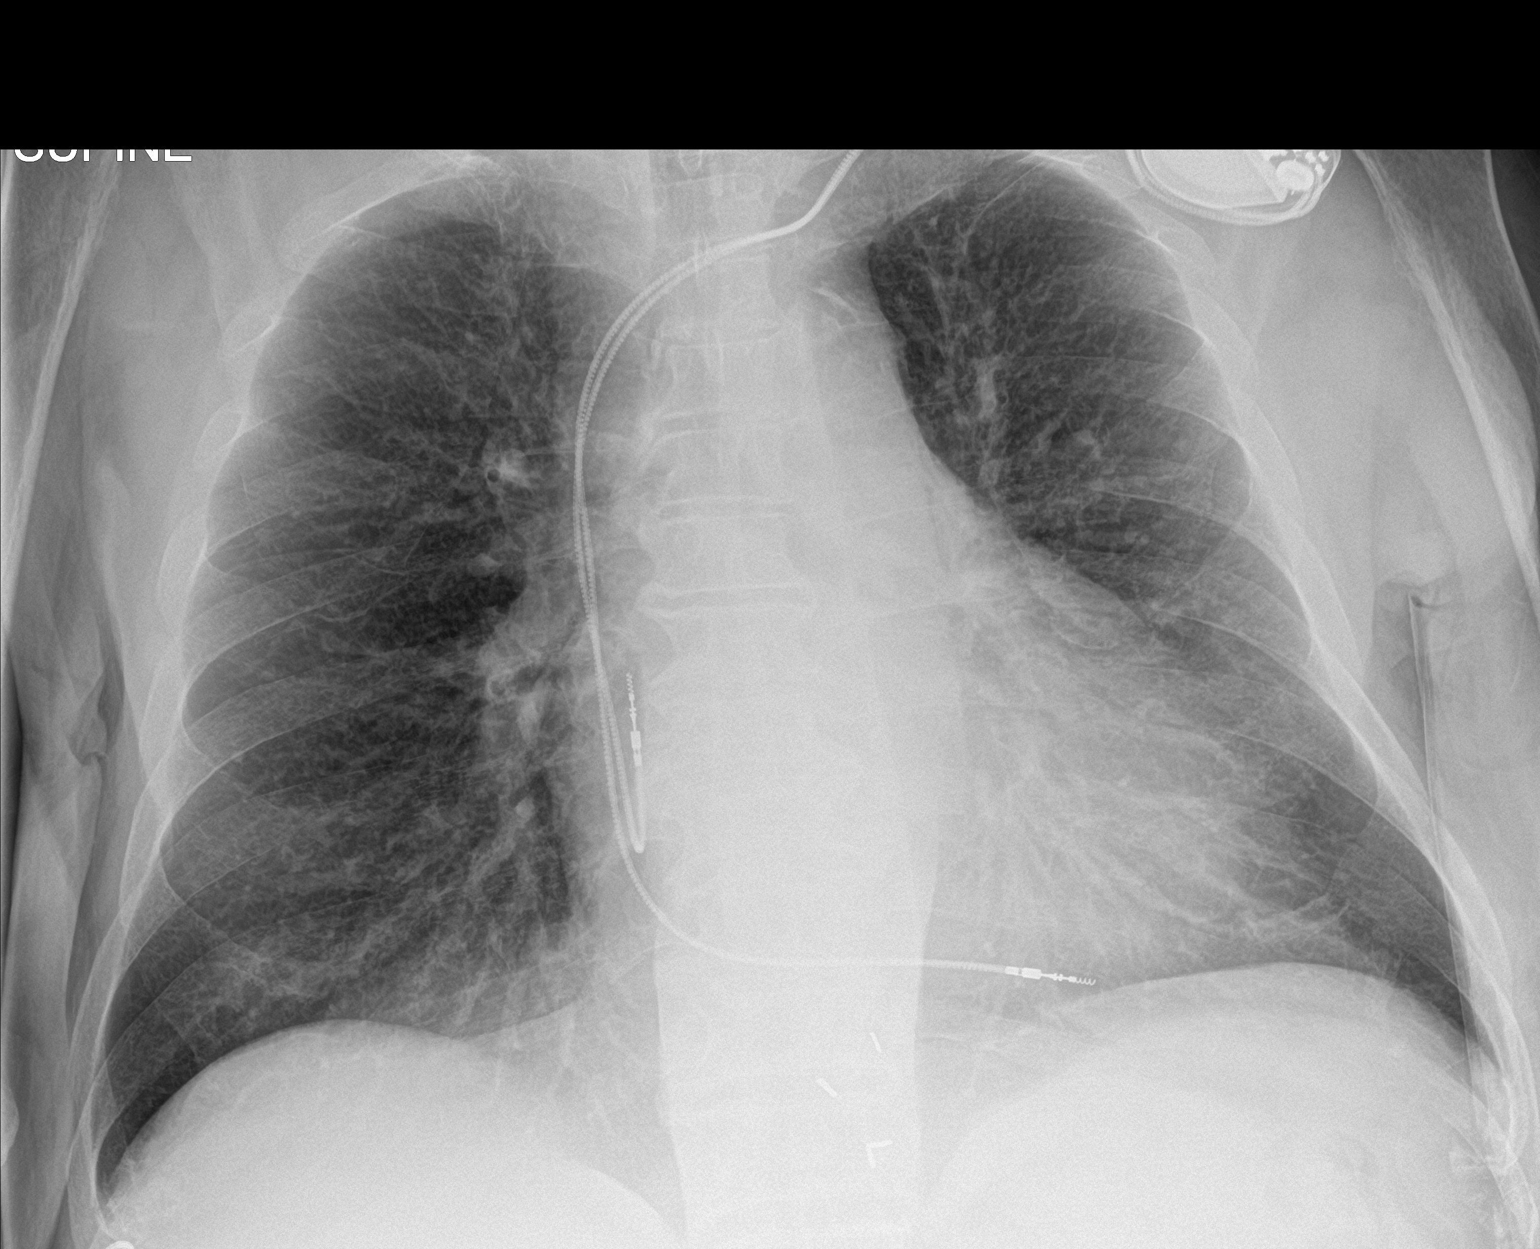

[3 of 3 positions shown; findings below may reference images not displayed]

FINDINGS: No fracture or other bone lesions are seen involving the ribs. There
is no evidence of pneumothorax or pleural effusion. Mild bilateral
interstitial thickening similar in appearance to the prior exams. No
focal consolidation, pleural effusion or pneumothorax. Heart size
and mediastinal contours are within normal limits. Dual lead cardiac
pacemaker.
IMPRESSION: Negative.

## 2020-10-07 IMAGING — DX DG ABDOMEN 1V
1 series · 1 of 1 positions shown · non-contrast
Comparison: None.

CLINICAL DATA: Multiple recent falls.  Left hip rib and chest pain

EXAM:
ABDOMEN - 1 VIEW

[abdomen kub]
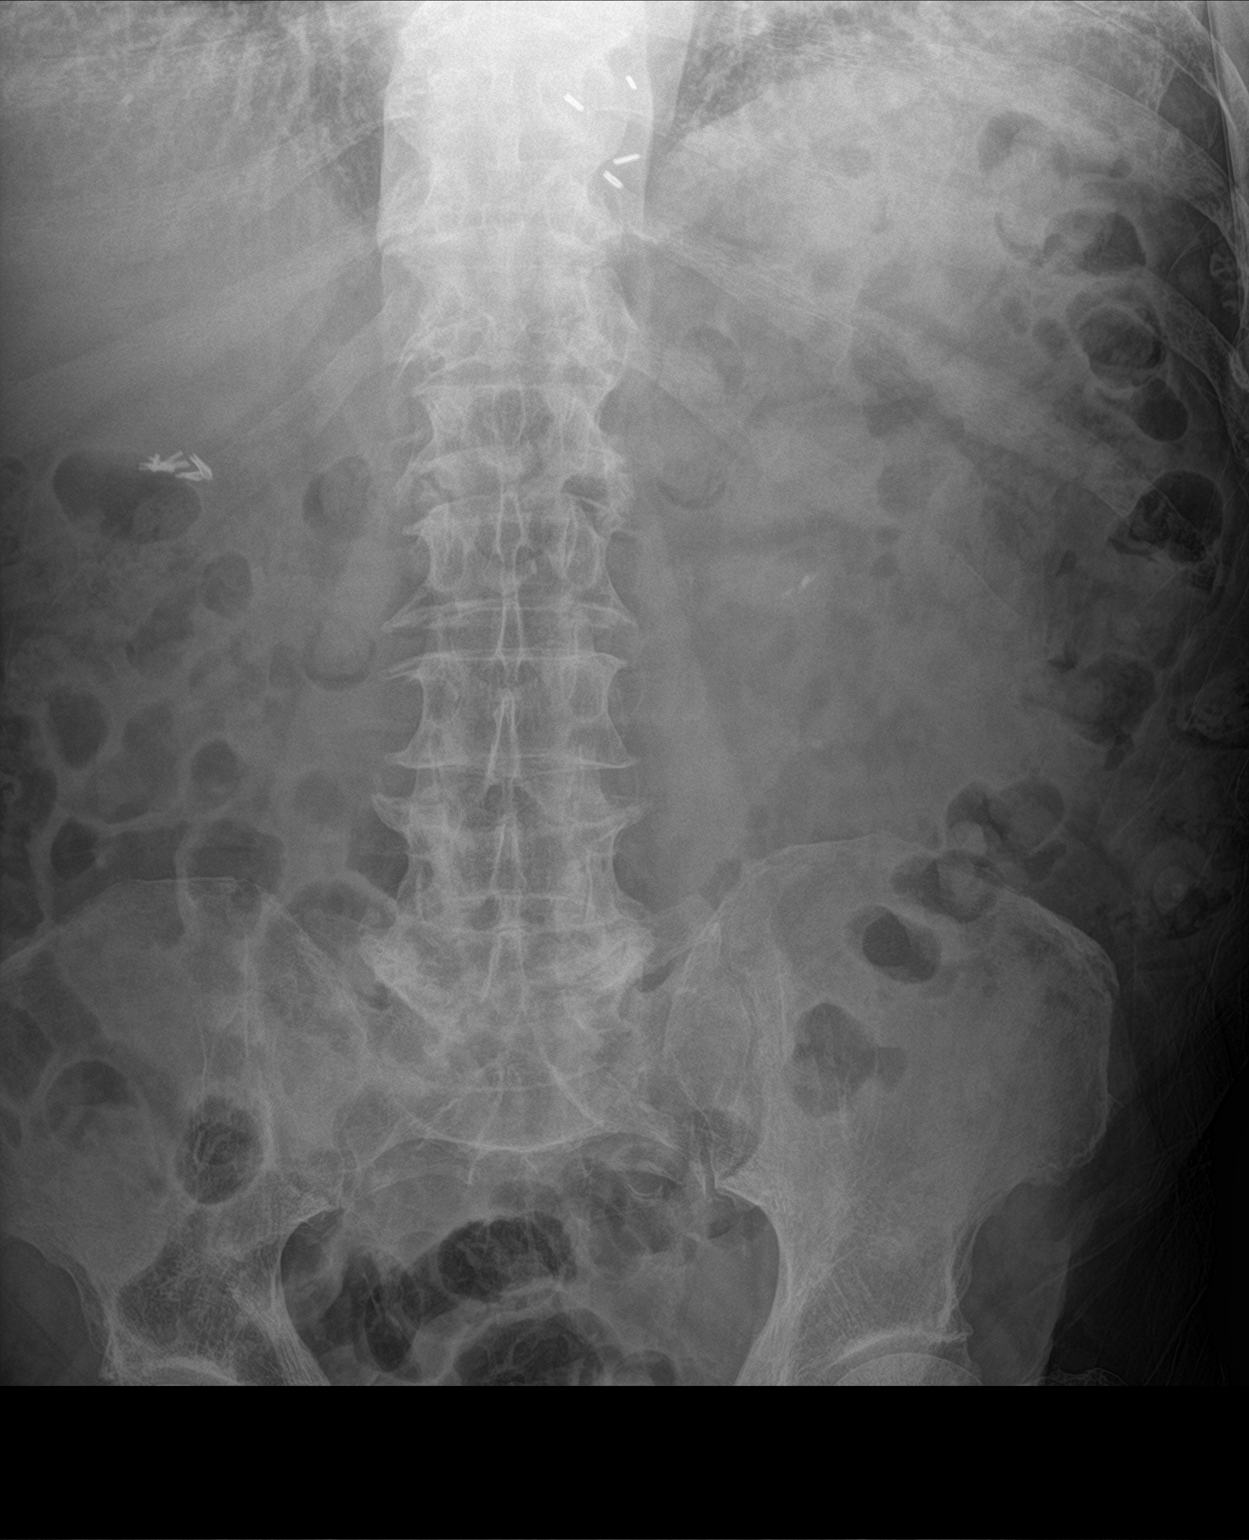

[1 of 1 positions shown; findings below may reference images not displayed]

FINDINGS: Prior cholecystectomy. Nonobstructive bowel gas pattern. No
organomegaly or free air. Degenerative changes within the lumbar
spine. No acute bony abnormality.
IMPRESSION: No acute findings.

## 2020-10-13 IMAGING — CT CT ABD-PELV W/ CM
2 of 5 series · 16 of 46 positions shown, 18 images · IV contrast (omnipaque)
Comparison: CT 07/28/2019

CLINICAL DATA: LEFT lower quadrant pain.

EXAM:
CT ABDOMEN AND PELVIS WITH CONTRAST
TECHNIQUE: Multidetector CT imaging of the abdomen and pelvis was performed
using the standard protocol following bolus administration of
intravenous contrast.
CONTRAST:  100mL OMNIPAQUE IOHEXOL 300 MG/ML  SOLN

[Series 3: a/p w/ 5mm · axial · 0.74mm/px · z∈[-586,-181]mm · 13 of 93 slices shown, 15 images]
[im 6/93  soft-tissue]
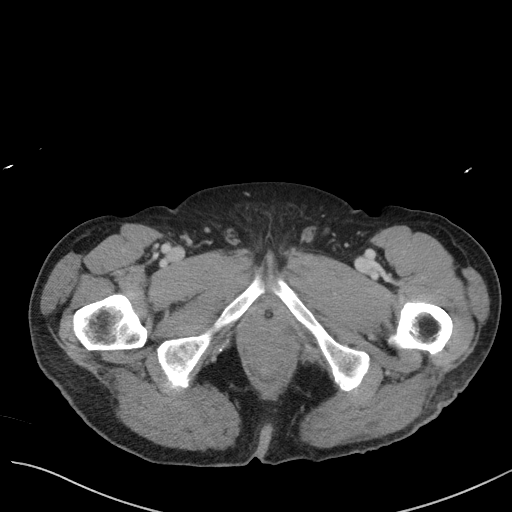
[im 6/93  bone]
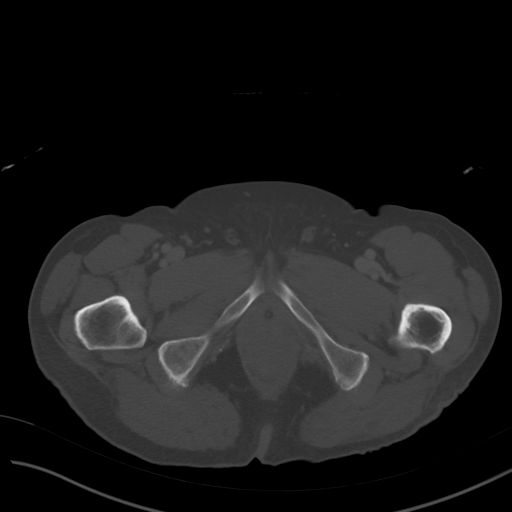
[im 12/93  soft-tissue]
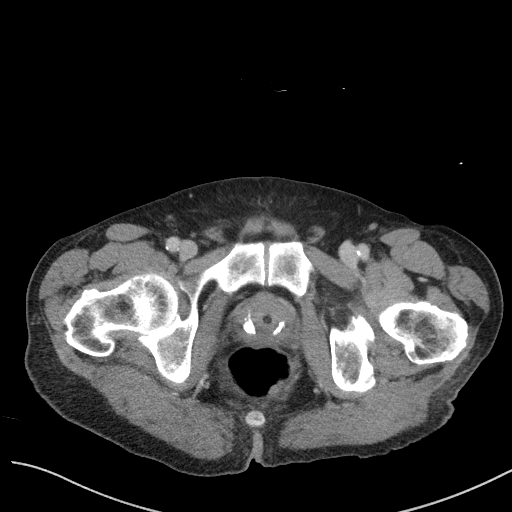
[im 18/93  soft-tissue]
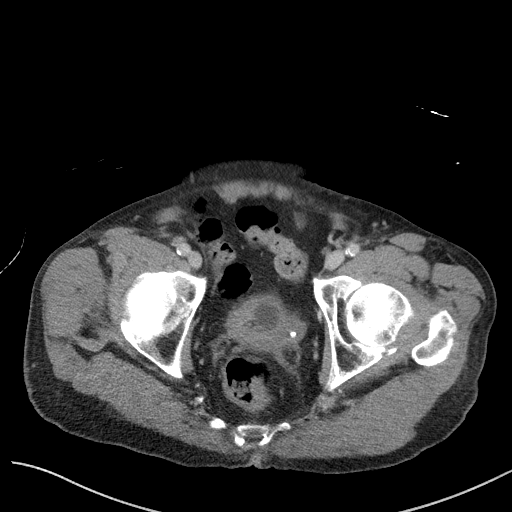
[im 29/93  soft-tissue]
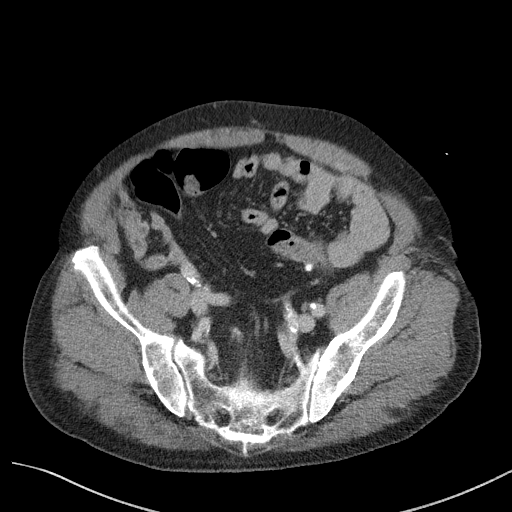
[im 35/93  soft-tissue]
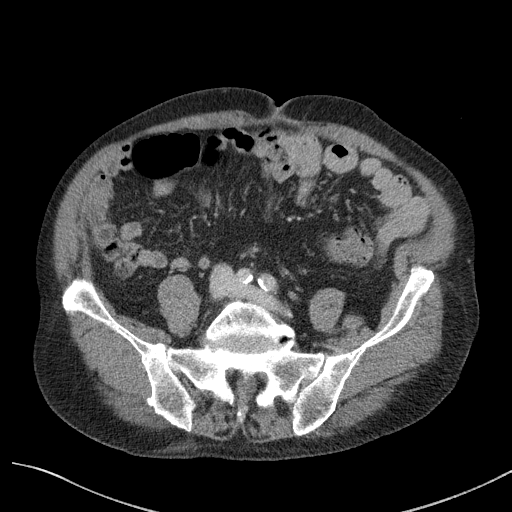
[im 41/93  soft-tissue]
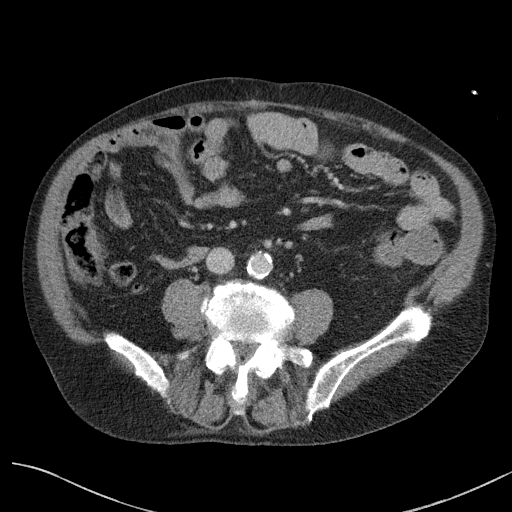
[im 47/93  soft-tissue]
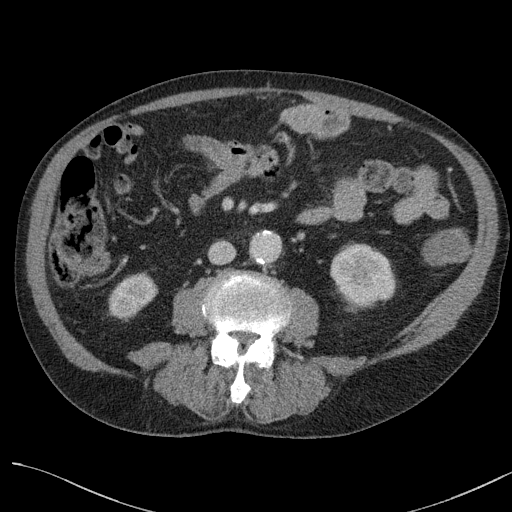
[im 52/93  soft-tissue]
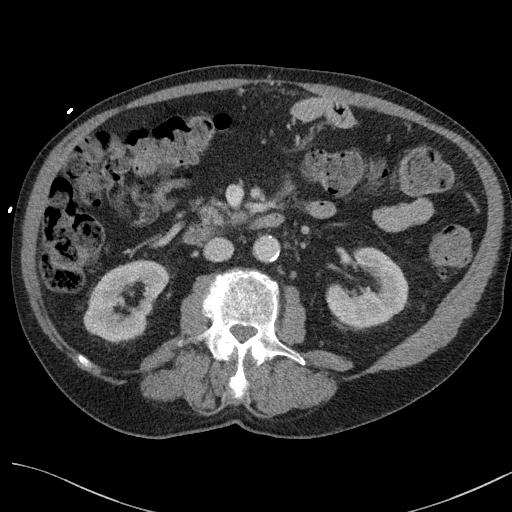
[im 58/93  soft-tissue]
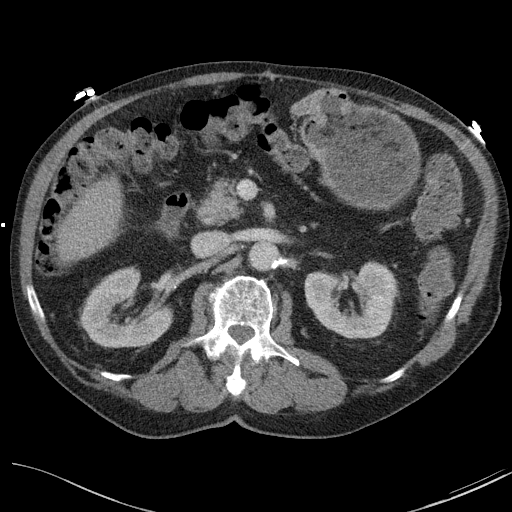
[im 58/93  bone]
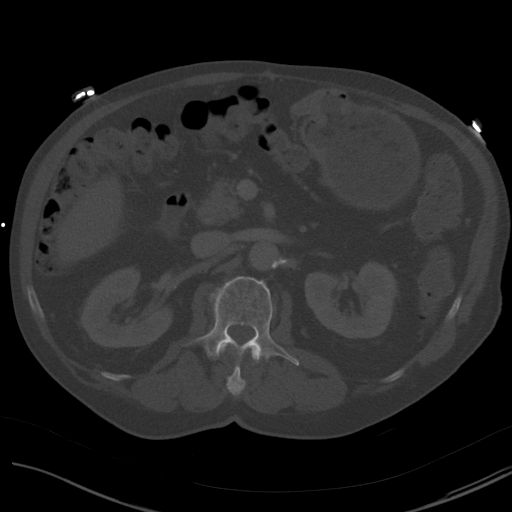
[im 64/93  soft-tissue]
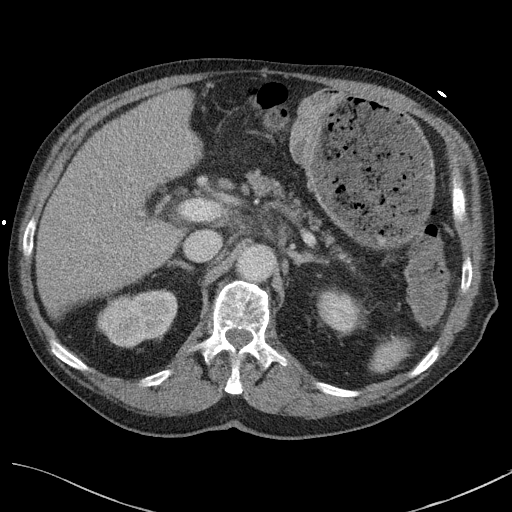
[im 75/93  soft-tissue]
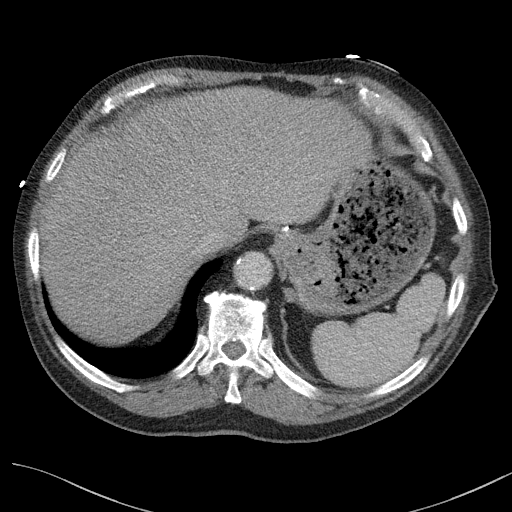
[im 81/93  soft-tissue]
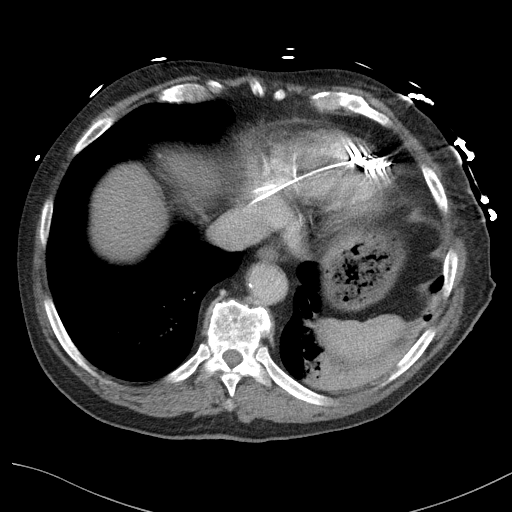
[im 87/93  soft-tissue]
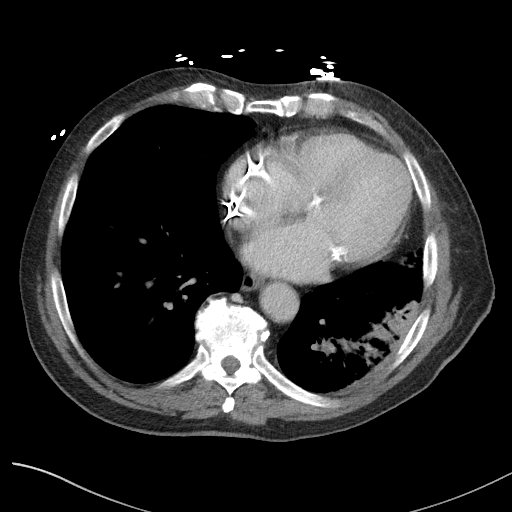

[Series 6: a/p w/ cor · coronal · 0.78mm/px · 3 of 155 slices shown]
[im 52/155  soft-tissue]
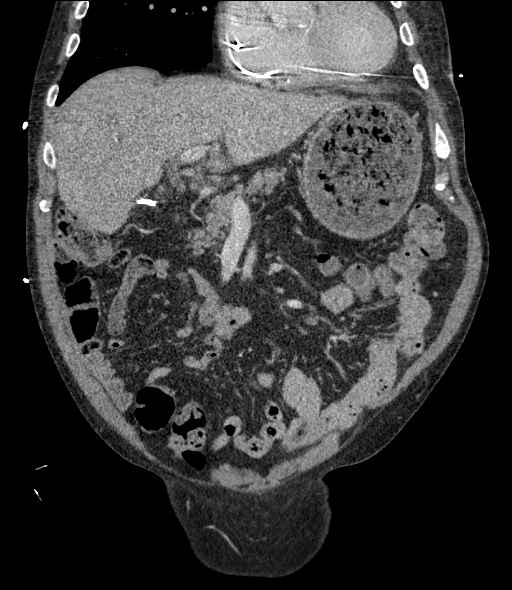
[im 69/155  soft-tissue]
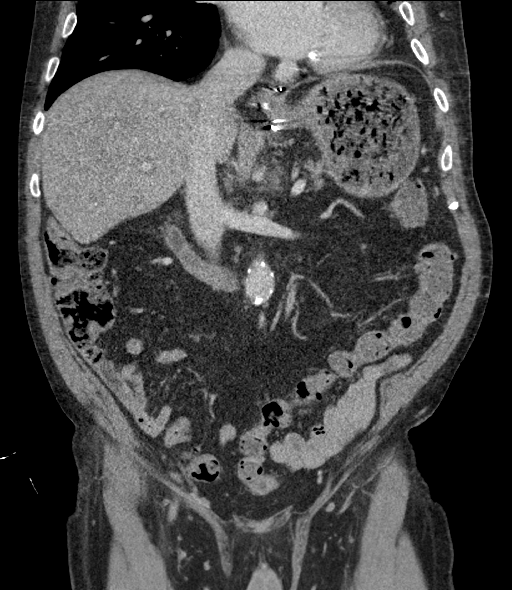
[im 86/155  soft-tissue]
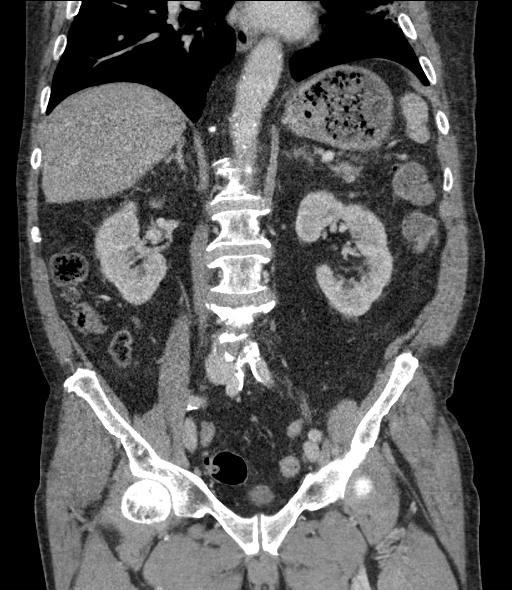

[16 of 46 positions shown; findings below may reference images not displayed]

FINDINGS: Lower chest: New airspace consolidation in the LEFT lower lobe with
some air bronchograms most consistent with pneumonia.

Hepatobiliary: No focal hepatic lesion. Postcholecystectomy. No
biliary dilatation.

Pancreas: Pancreas is normal. No ductal dilatation. No pancreatic
inflammation.

Spleen: Normal spleen

Adrenals/urinary tract: Adrenal glands and kidneys are normal. The
ureters and bladder normal. Foley catheter within the bladder.

Stomach/Bowel: Partial gastrectomy anatomy. Large volume of food
stuff in the stomach. Small bowel normal. Appendix and cecum normal.
Colon rectosigmoid colon normal.

Vascular/Lymphatic: Abdominal aorta is normal caliber with
atherosclerotic calcification. There is no retroperitoneal or
periportal lymphadenopathy. No pelvic lymphadenopathy.

Reproductive: Prostate small.

Other: No free fluid.

Musculoskeletal: No aggressive osseous lesion.
IMPRESSION: 1. LEFT lower lobe pneumonia.
2. No acute findings in the abdomen pelvis.
3. Foley catheter in bladder.

## 2020-10-29 LAB — LEGIONELLA PNEUMOPHILA SEROGP 1 UR AG: L. pneumophila Serogp 1 Ur Ag: NEGATIVE

## 2020-12-04 IMAGING — CT CT ABD-PELV W/ CM
2 of 5 series · 16 of 46 positions shown, 18 images · IV contrast (APPLIED)
Comparison: 08/03/2019

CLINICAL DATA: Weakness, anemia

EXAM:
CT ABDOMEN AND PELVIS WITH CONTRAST
TECHNIQUE: Multidetector CT imaging of the abdomen and pelvis was performed
using the standard protocol following bolus administration of
intravenous contrast.
CONTRAST:  100mL OMNIPAQUE IOHEXOL 300 MG/ML  SOLN

[Series 3: abdomen 5.0 · axial · 0.76mm/px · z∈[-391,-6]mm · 13 of 89 slices shown, 15 images]
[im 6/89  soft-tissue]
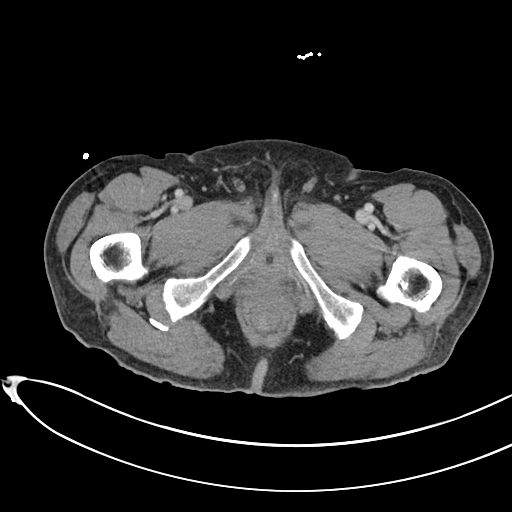
[im 6/89  bone]
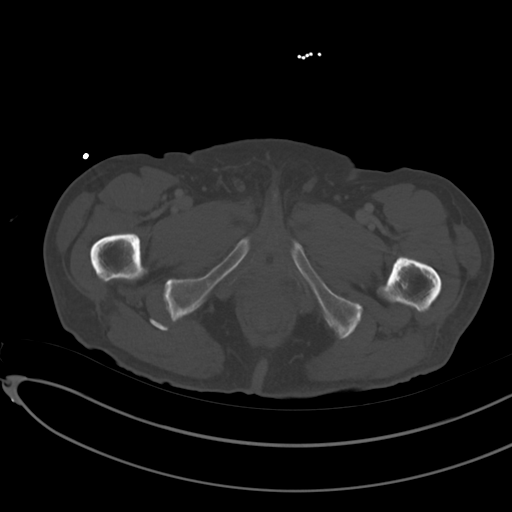
[im 12/89  soft-tissue]
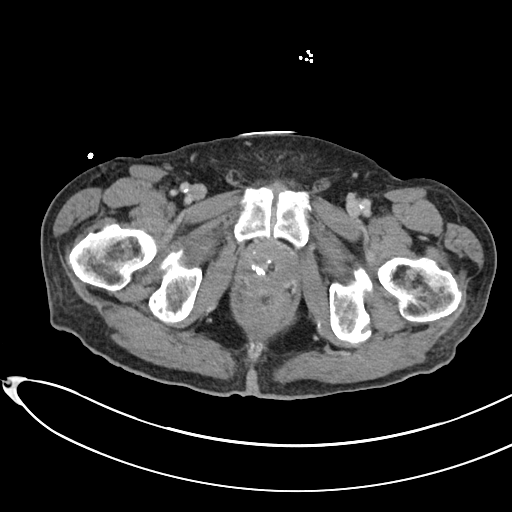
[im 18/89  soft-tissue]
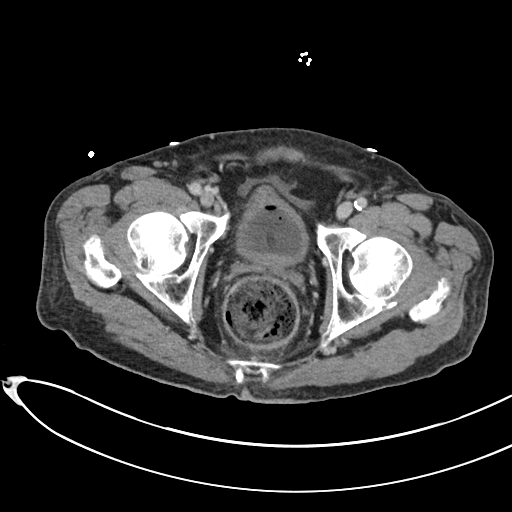
[im 24/89  soft-tissue]
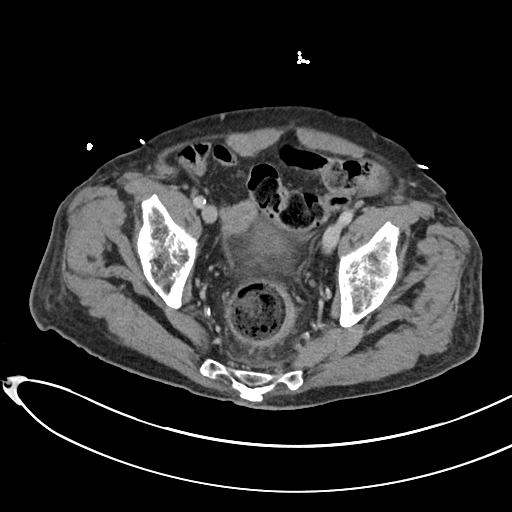
[im 30/89  soft-tissue]
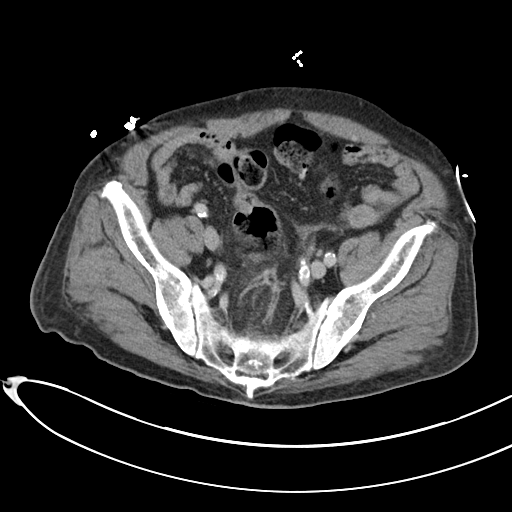
[im 36/89  soft-tissue]
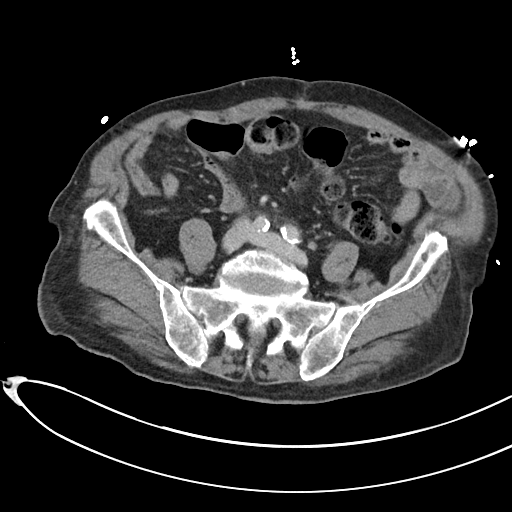
[im 47/89  soft-tissue]
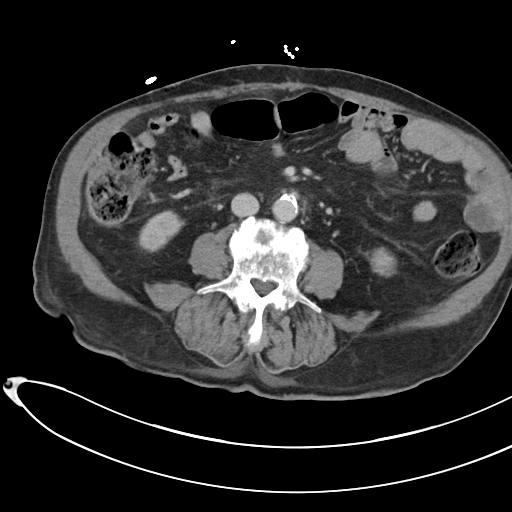
[im 53/89  soft-tissue]
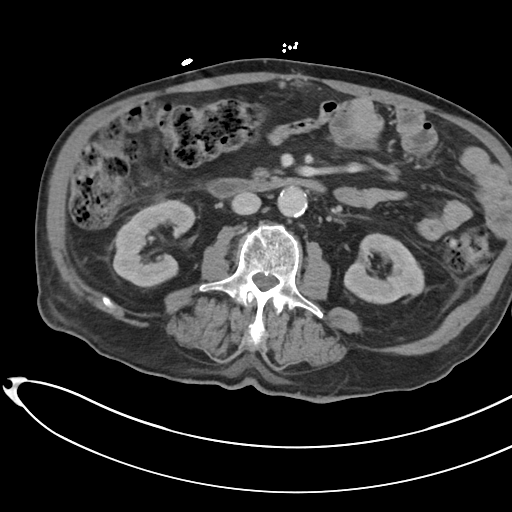
[im 59/89  soft-tissue]
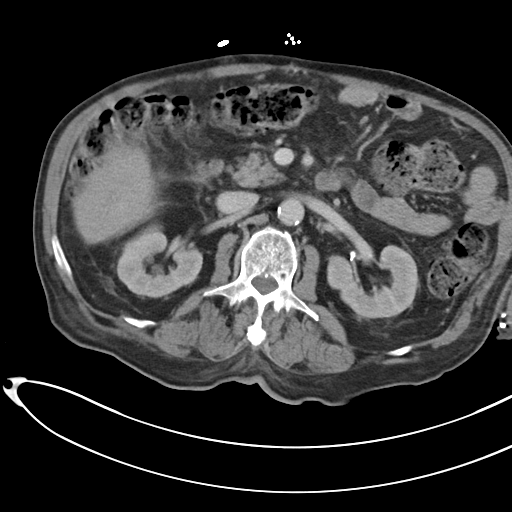
[im 59/89  bone]
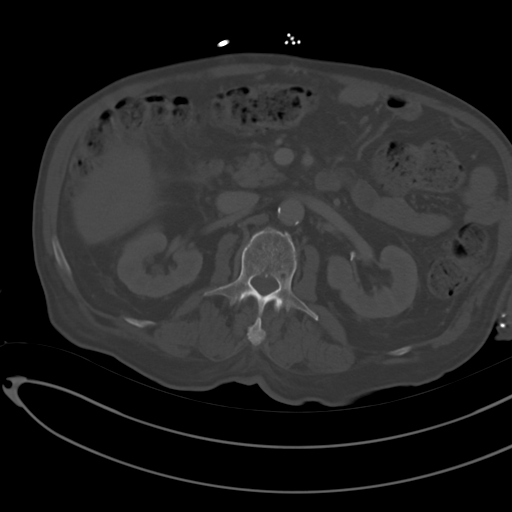
[im 65/89  soft-tissue]
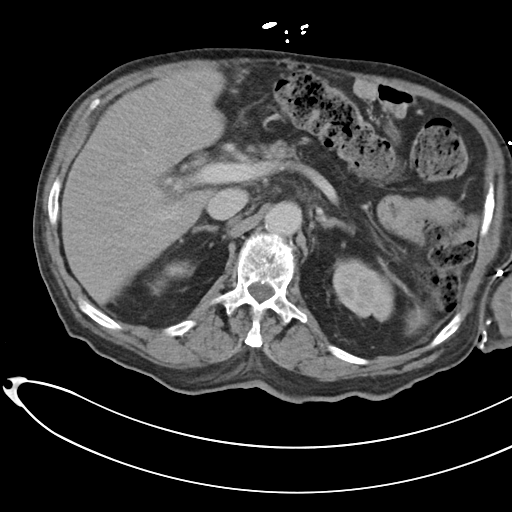
[im 71/89  soft-tissue]
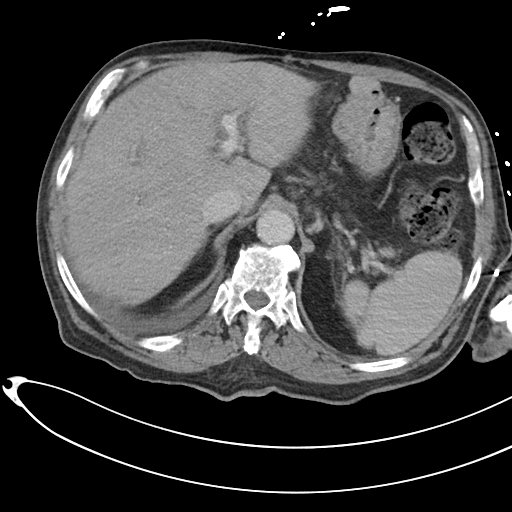
[im 77/89  soft-tissue]
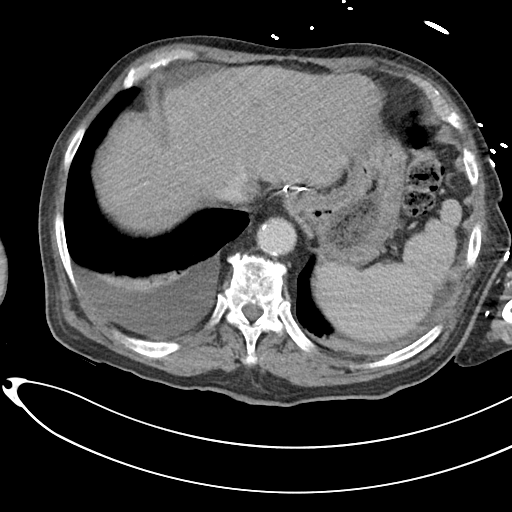
[im 83/89  soft-tissue]
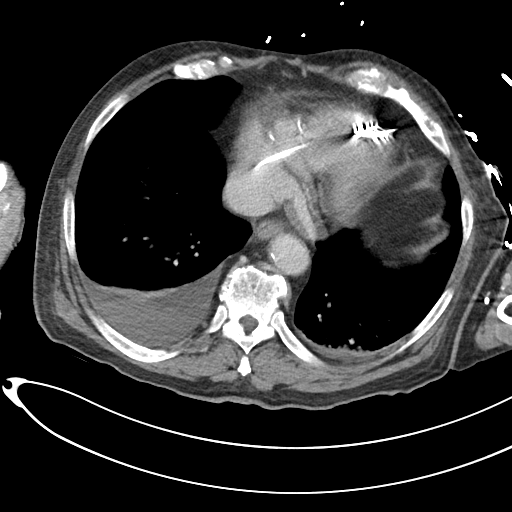

[Series 6: abdomen 3.0 mpr cor · coronal · 0.77mm/px · 3 of 98 slices shown]
[im 33/98  soft-tissue]
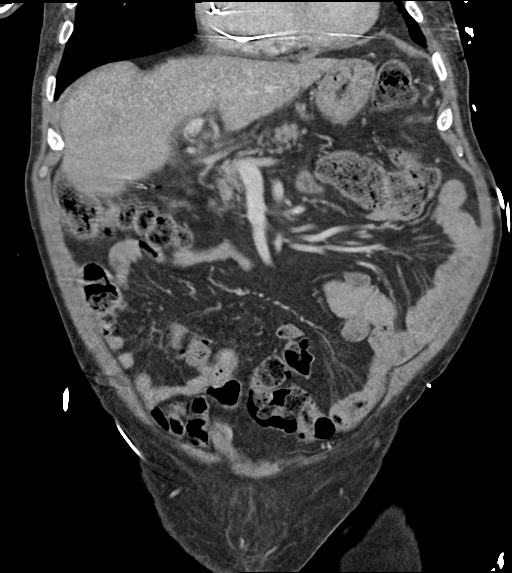
[im 44/98  soft-tissue]
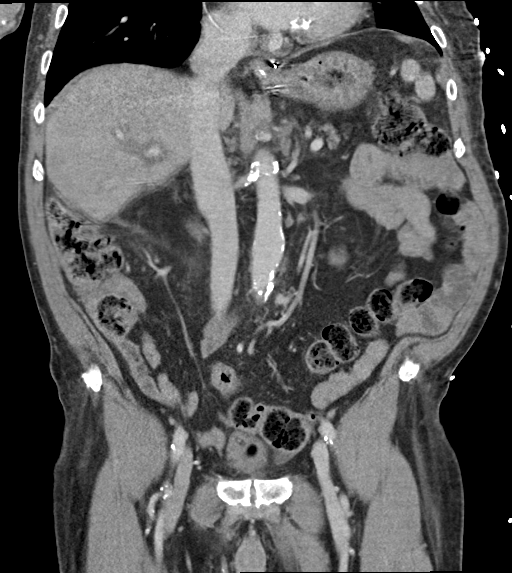
[im 54/98  soft-tissue]
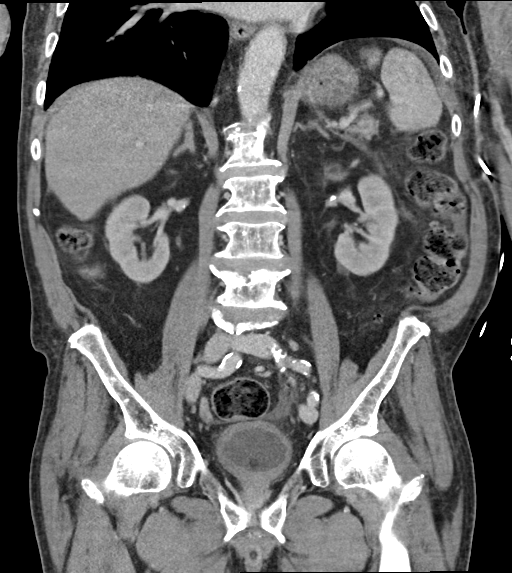

[16 of 46 positions shown; findings below may reference images not displayed]

FINDINGS: Lower chest: There is a small right pleural effusion volume
estimated less than 1 L. minimal dependent atelectasis within the
lower lobes.

Hepatobiliary: Gallbladder is surgically absent. Slight increase in
periportal edema since prior study. This is nonspecific. The
remainder of the liver is unremarkable.

Pancreas: Unremarkable. No pancreatic ductal dilatation or
surrounding inflammatory changes.

Spleen: Normal in size without focal abnormality.

Adrenals/Urinary Tract: The kidneys enhance normally and
symmetrically. No urinary tract calculi or obstruction. The adrenals
are normal.

Bladder is decompressed with a Foley catheter.

Stomach/Bowel: No bowel obstruction or ileus. Moderate stool
throughout the colon, most pronounced within the rectal vault which
may reflect impaction. Normal appendix right lower quadrant.

Vascular/Lymphatic: Aortic atherosclerosis. No enlarged abdominal or
pelvic lymph nodes.

Reproductive: Prostate is unremarkable.

Other: Haziness of the central mesenteric fat at the level of the
celiac axis is unchanged. No free fluid or free gas. No abdominal
wall hernia.

Musculoskeletal: No acute or destructive bony lesions. Reconstructed
images demonstrate no additional findings.
IMPRESSION: 1. Small right pleural effusion volume estimated less than 1 L.
2. Moderate stool throughout the colon, most pronounced within the
rectal vault which may reflect impaction.
3. Slight increase in periportal edema since prior study. This is
nonspecific.
4. Aortic Atherosclerosis ([id number]).

## 2020-12-04 IMAGING — US US ABDOMEN LIMITED
1 series · 14 of 25 positions shown · non-contrast
Comparison: CT scan 09/24/2019

CLINICAL DATA: Elevated liver function studies.

EXAM:
ULTRASOUND ABDOMEN LIMITED RIGHT UPPER QUADRANT

[Series 1: us abdomen limited ruq · 14 of 40 slices shown]
[im 1/40]
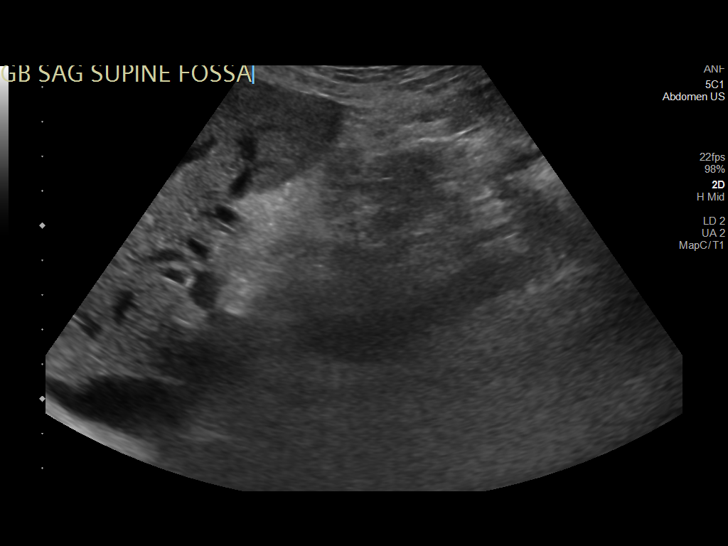
[im 4/40]
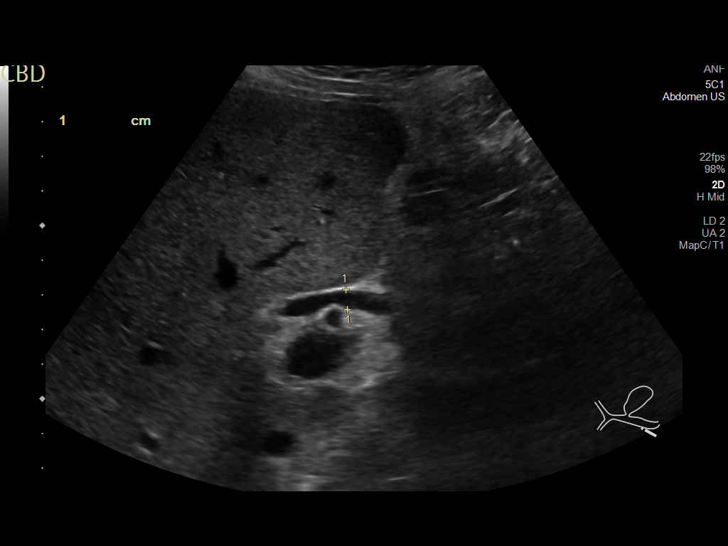
[im 7/40]
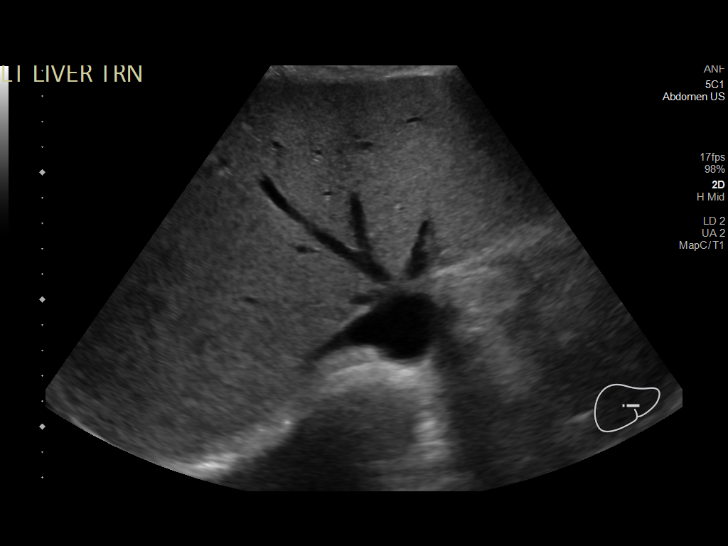
[im 10/40]
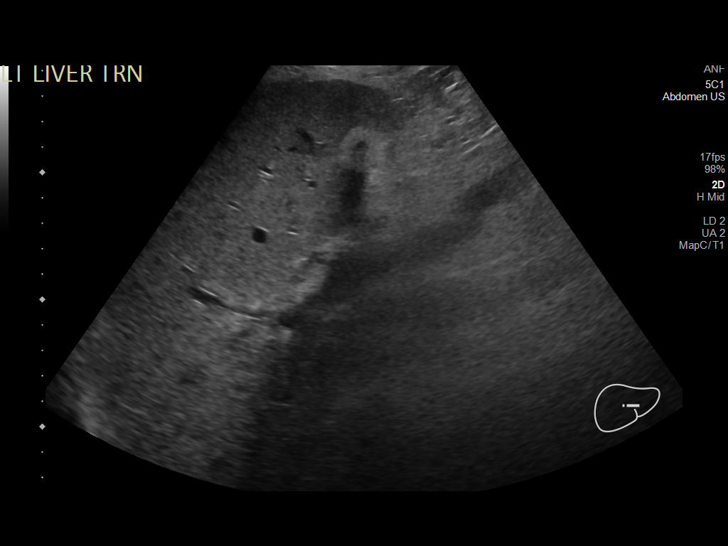
[im 14/40]
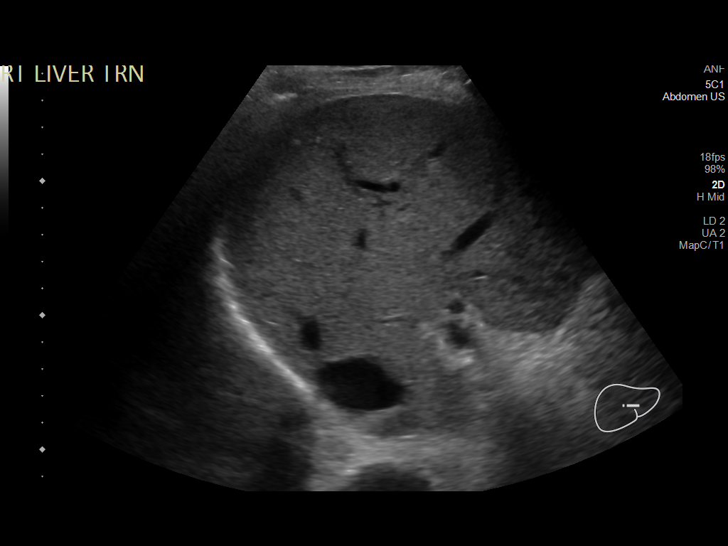
[im 15/40]
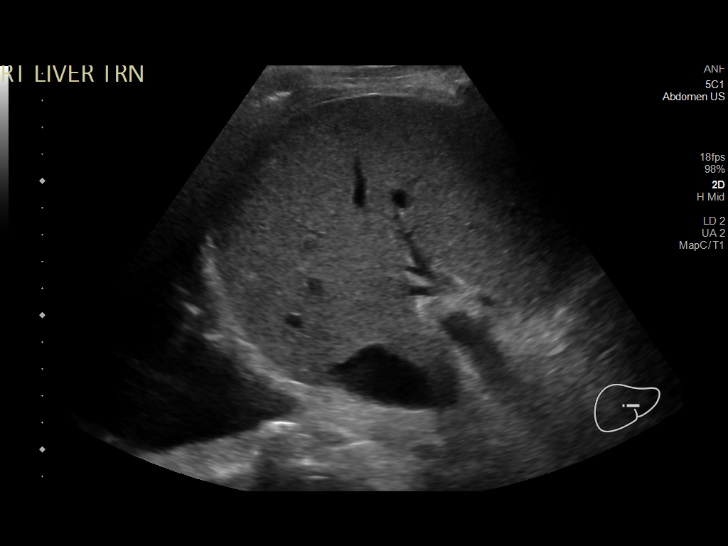
[im 18/40]
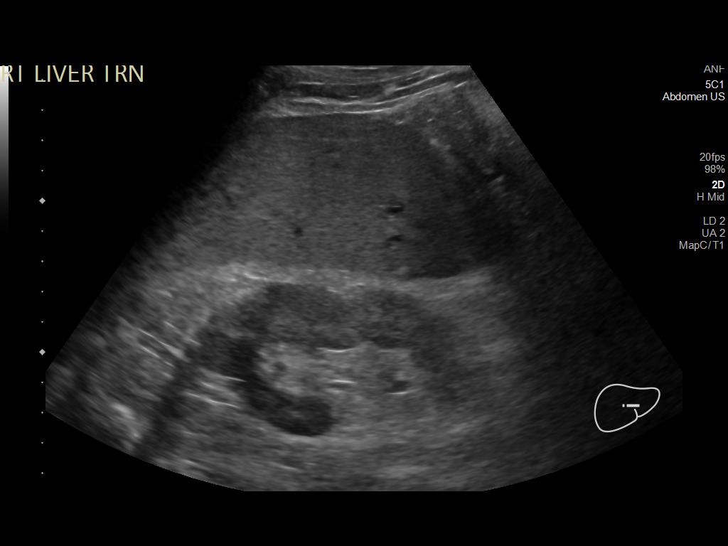
[im 22/40]
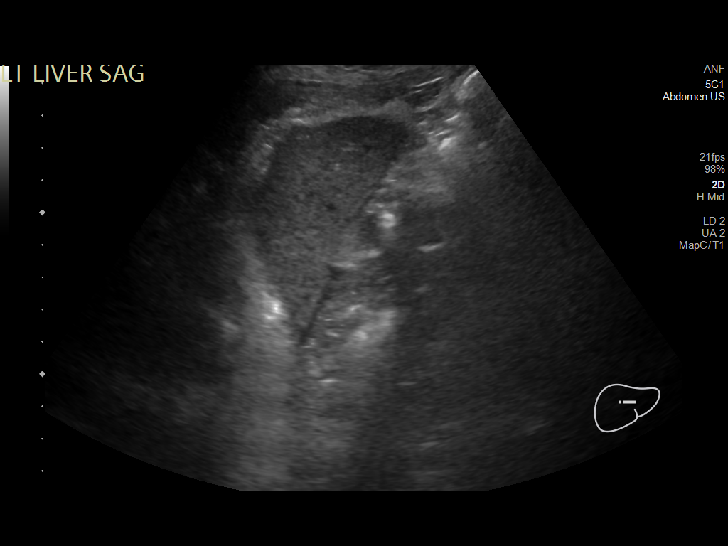
[im 25/40]
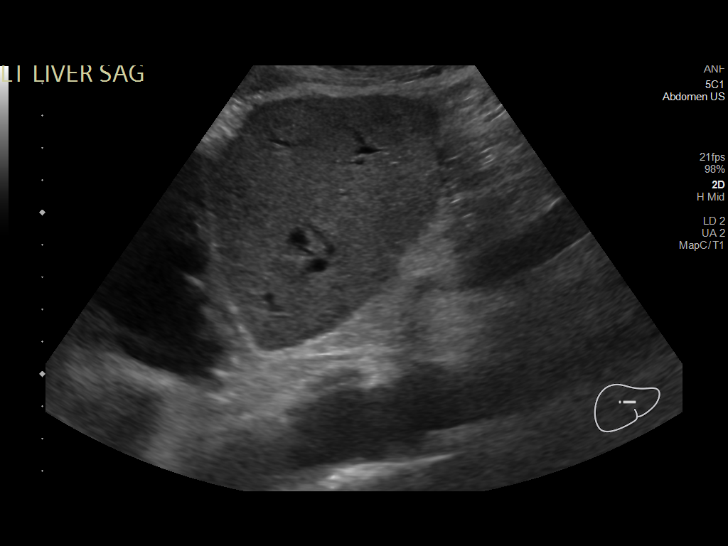
[im 27/40]
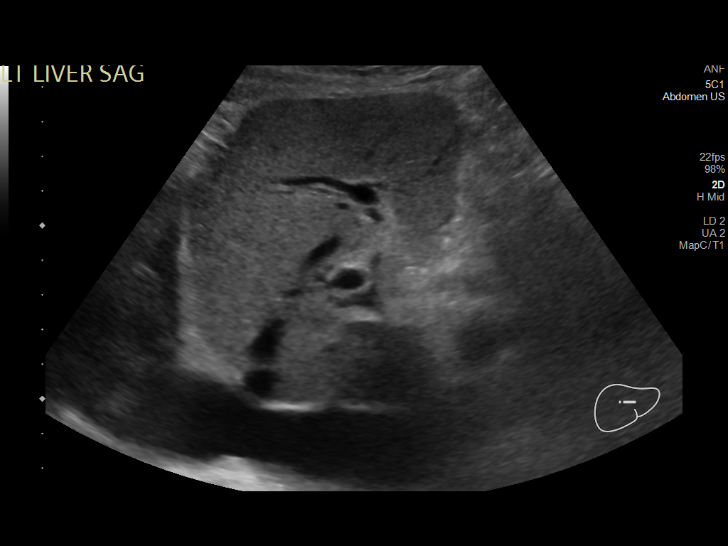
[im 30/40]
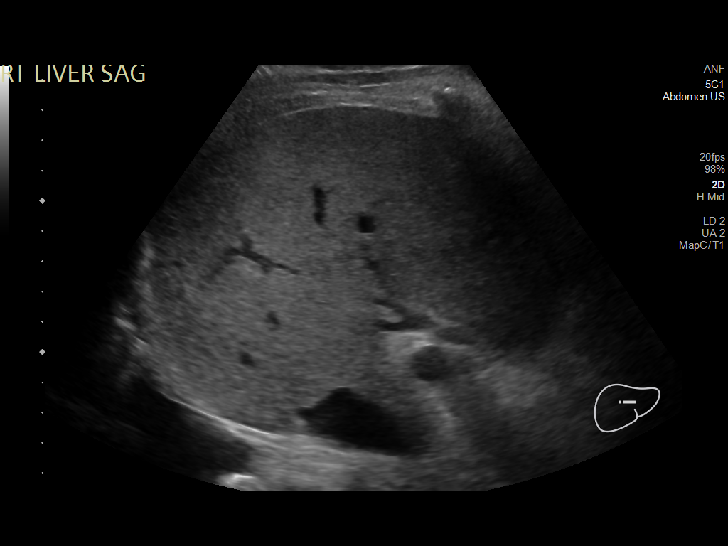
[im 33/40]
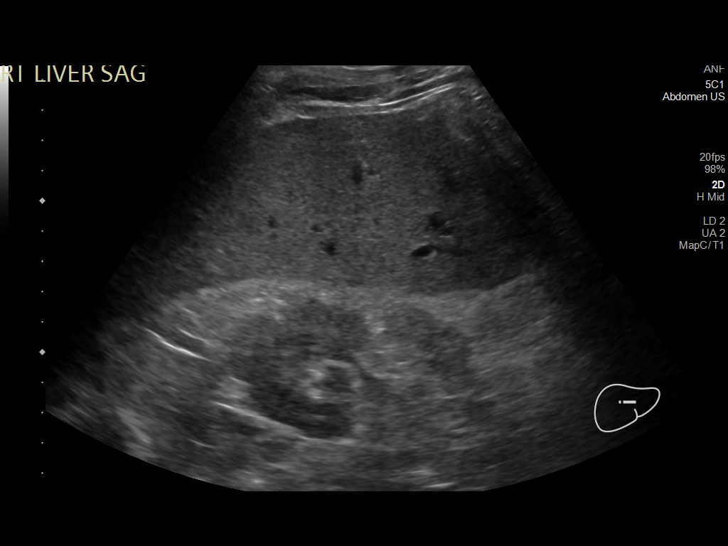
[im 36/40]
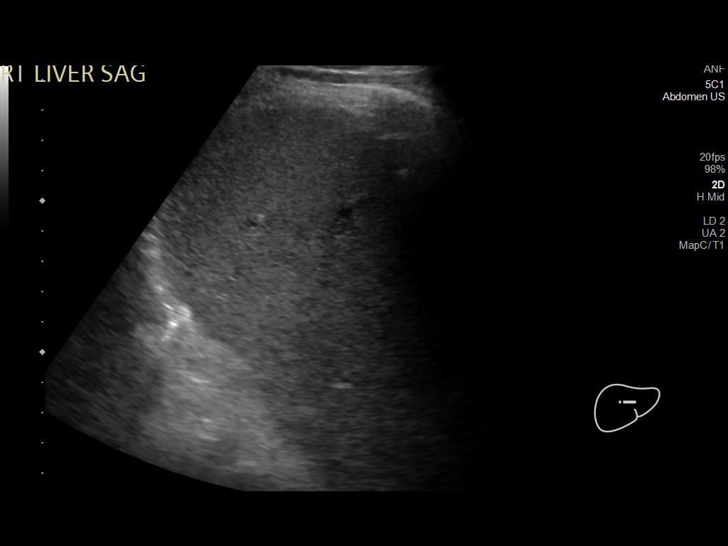
[im 40/40]
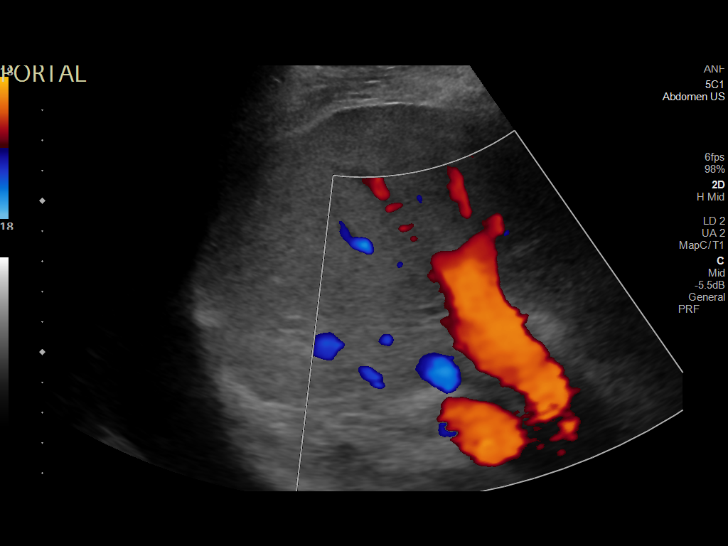

[14 of 25 positions shown; findings below may reference images not displayed]

FINDINGS: Gallbladder:

Surgically absent.

Common bile duct:

Diameter: 5.8 mm

Liver:

Normal echogenicity. No focal hepatic lesions. Portal vein is patent
on color Doppler imaging with normal direction of blood flow towards
the liver.

Other: Right pleural effusion noted.
IMPRESSION: 1. Status post cholecystectomy.  No biliary dilatation
2. Unremarkable sonographic appearance of the liver.
3. Right pleural effusion.

## 2020-12-04 IMAGING — CT CT HEAD W/O CM
4 series · 16 of 47 positions shown, 18 images · non-contrast
Comparison: 07/28/2019

CLINICAL DATA: Altered mental status

EXAM:
CT HEAD WITHOUT CONTRAST
TECHNIQUE: Contiguous axial images were obtained from the base of the skull
through the vertex without intravenous contrast.

[Series 3: head without · axial · non-contrast · 0.46mm/px · z∈[-46,+79]mm · 7 of 35 slices shown, 9 images]
[im 5/35  brain]
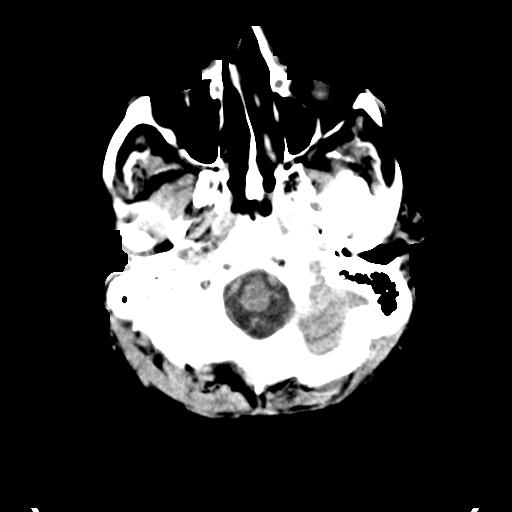
[im 5/35  bone]
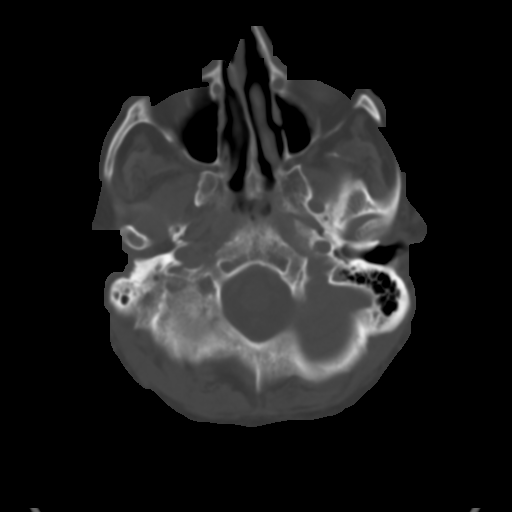
[im 9/35  brain]
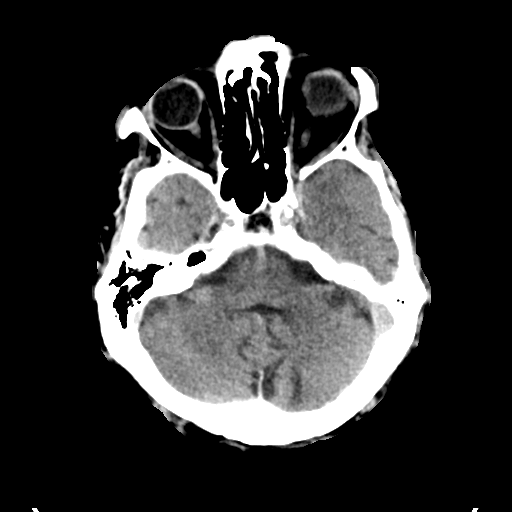
[im 13/35  brain]
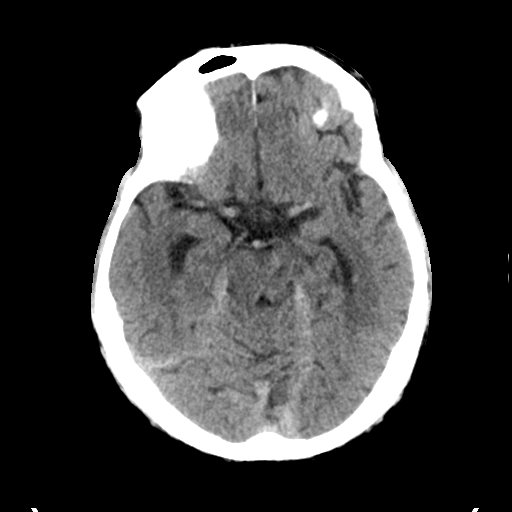
[im 18/35  brain]
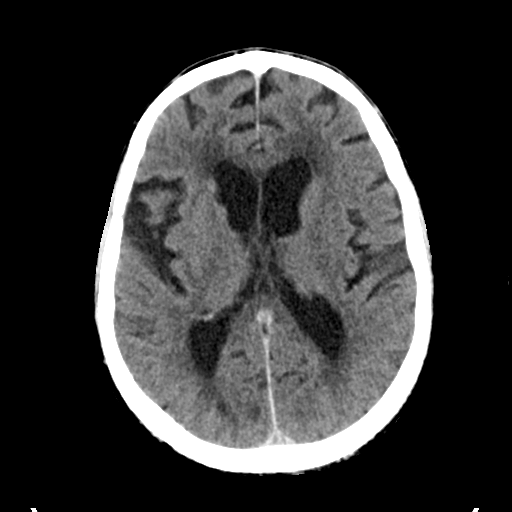
[im 22/35  brain]
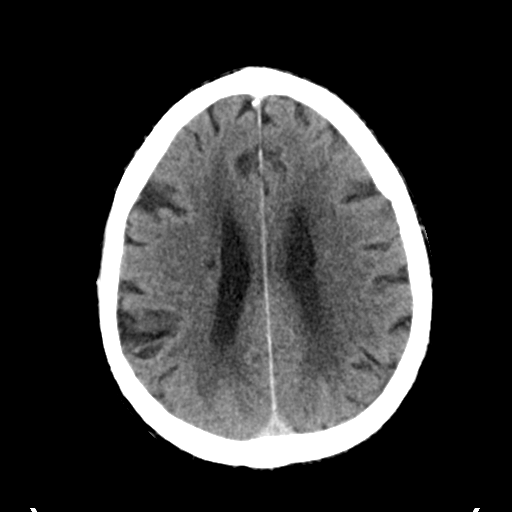
[im 22/35  bone]
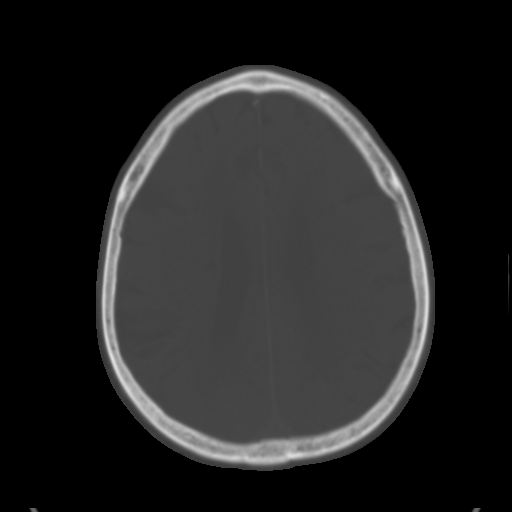
[im 26/35  brain]
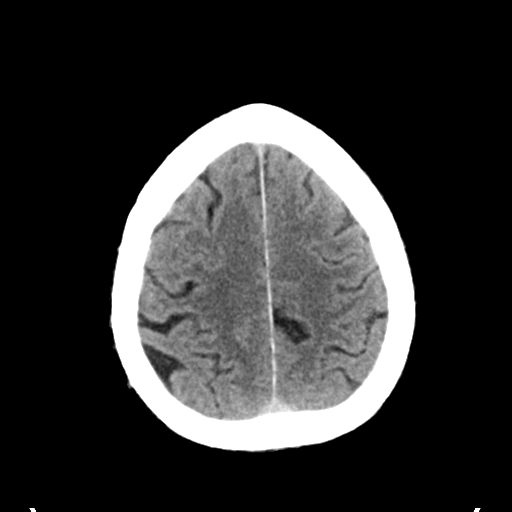
[im 30/35  brain]
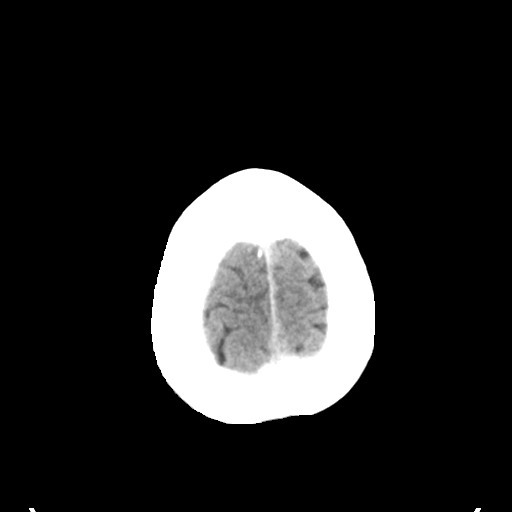

[Series 4: head bone · axial · 0.46mm/px · z∈[-50,-16]mm · 3 of 87 slices shown]
[im 9/87  bone]
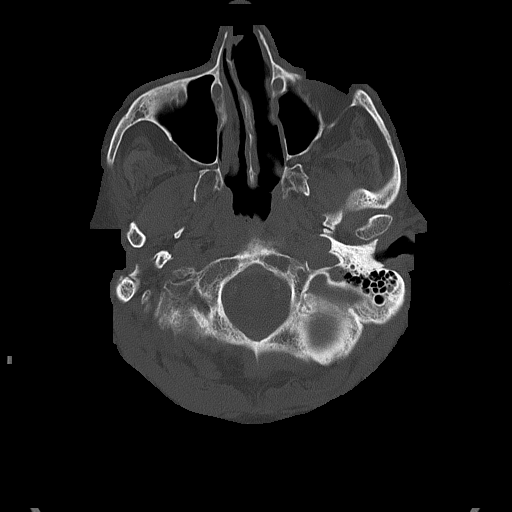
[im 18/87  bone]
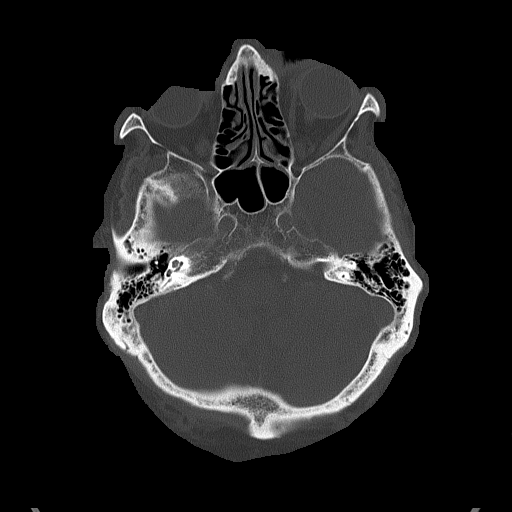
[im 26/87  bone]
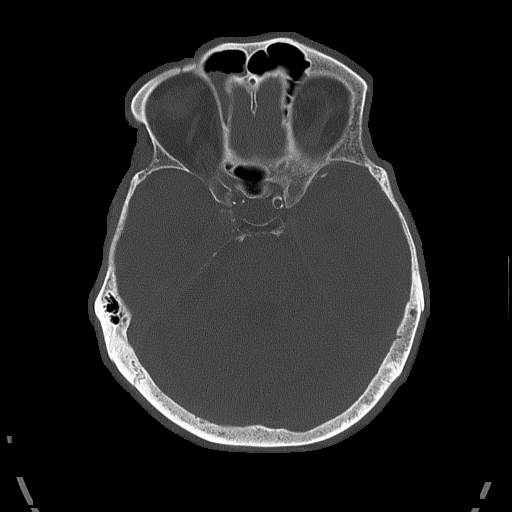

[Series 5: head without cor · coronal · non-contrast · 0.35mm/px · 3 of 73 slices shown]
[im 25/73  brain]
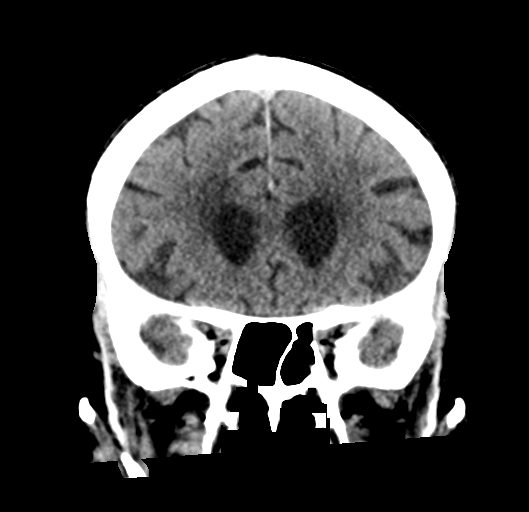
[im 33/73  brain]
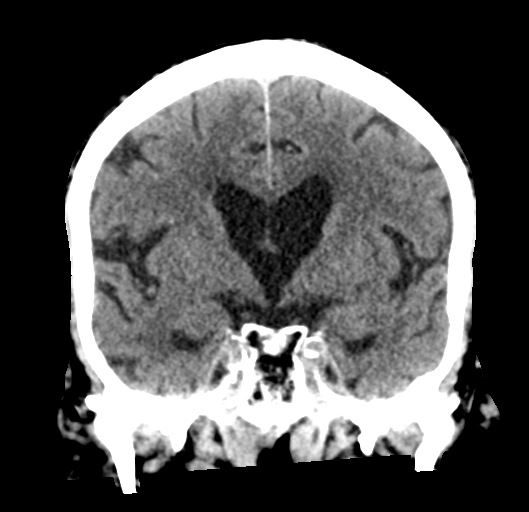
[im 41/73  brain]
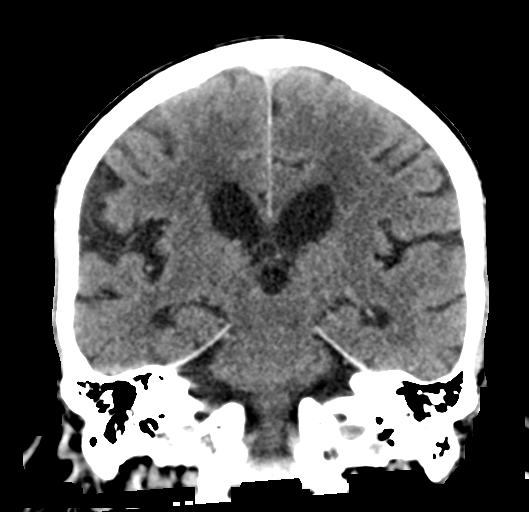

[Series 6: head without sag · sagittal · non-contrast · 0.34mm/px · 3 of 67 slices shown]
[im 23/67  brain]
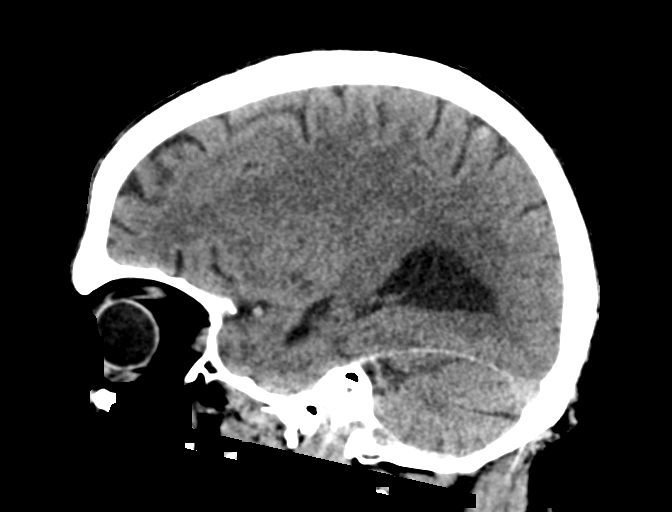
[im 34/67  brain]
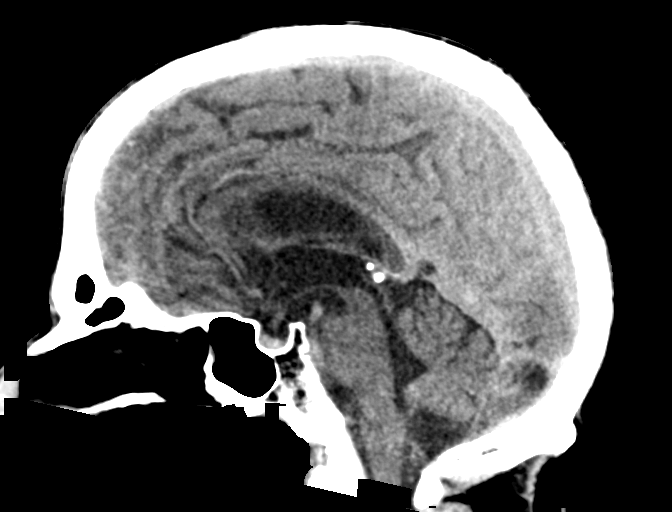
[im 45/67  brain]
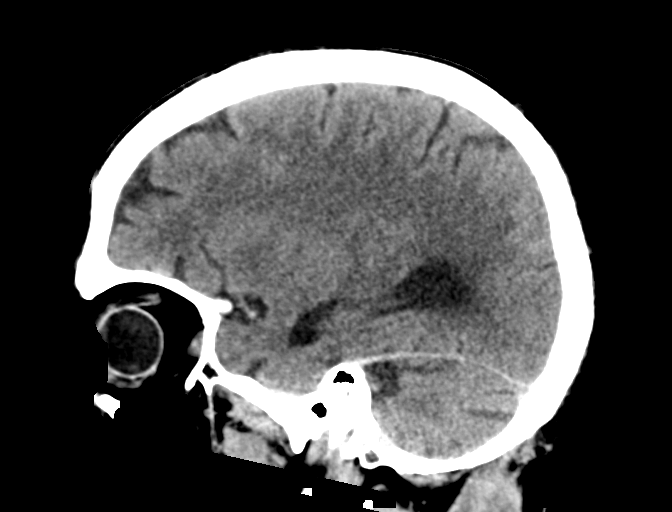

[16 of 47 positions shown; findings below may reference images not displayed]

FINDINGS: Brain: There is no acute intracranial hemorrhage, mass effect, or
edema. Gray-white differentiation is preserved. There is no
extra-axial fluid collection. Prominence of the ventricles and sulci
reflects stable parenchymal volume loss. There are small chronic
infarcts of the right caudate, corona radiata, and left cerebellum.
Additional patchy and confluent areas of hypoattenuation in the
supratentorial white matter are nonspecific but probably reflects
stable moderate chronic microvascular ischemic changes.

Vascular: There is atherosclerotic calcification at the skull base.

Skull: Calvarium is unremarkable.

Sinuses/Orbits: Mild mucosal thickening. Bilateral lens
replacements.

Other: Mastoid air cells are clear.
IMPRESSION: No acute intracranial hemorrhage, mass effect, or evidence of acute
infarction. Stable chronic findings detailed above.

## 2020-12-04 IMAGING — DX DG CHEST 1V PORT
1 series · 1 of 1 positions shown · non-contrast
Comparison: 07/28/2019

CLINICAL DATA: Weakness

EXAM:
PORTABLE CHEST 1 VIEW

[chest]
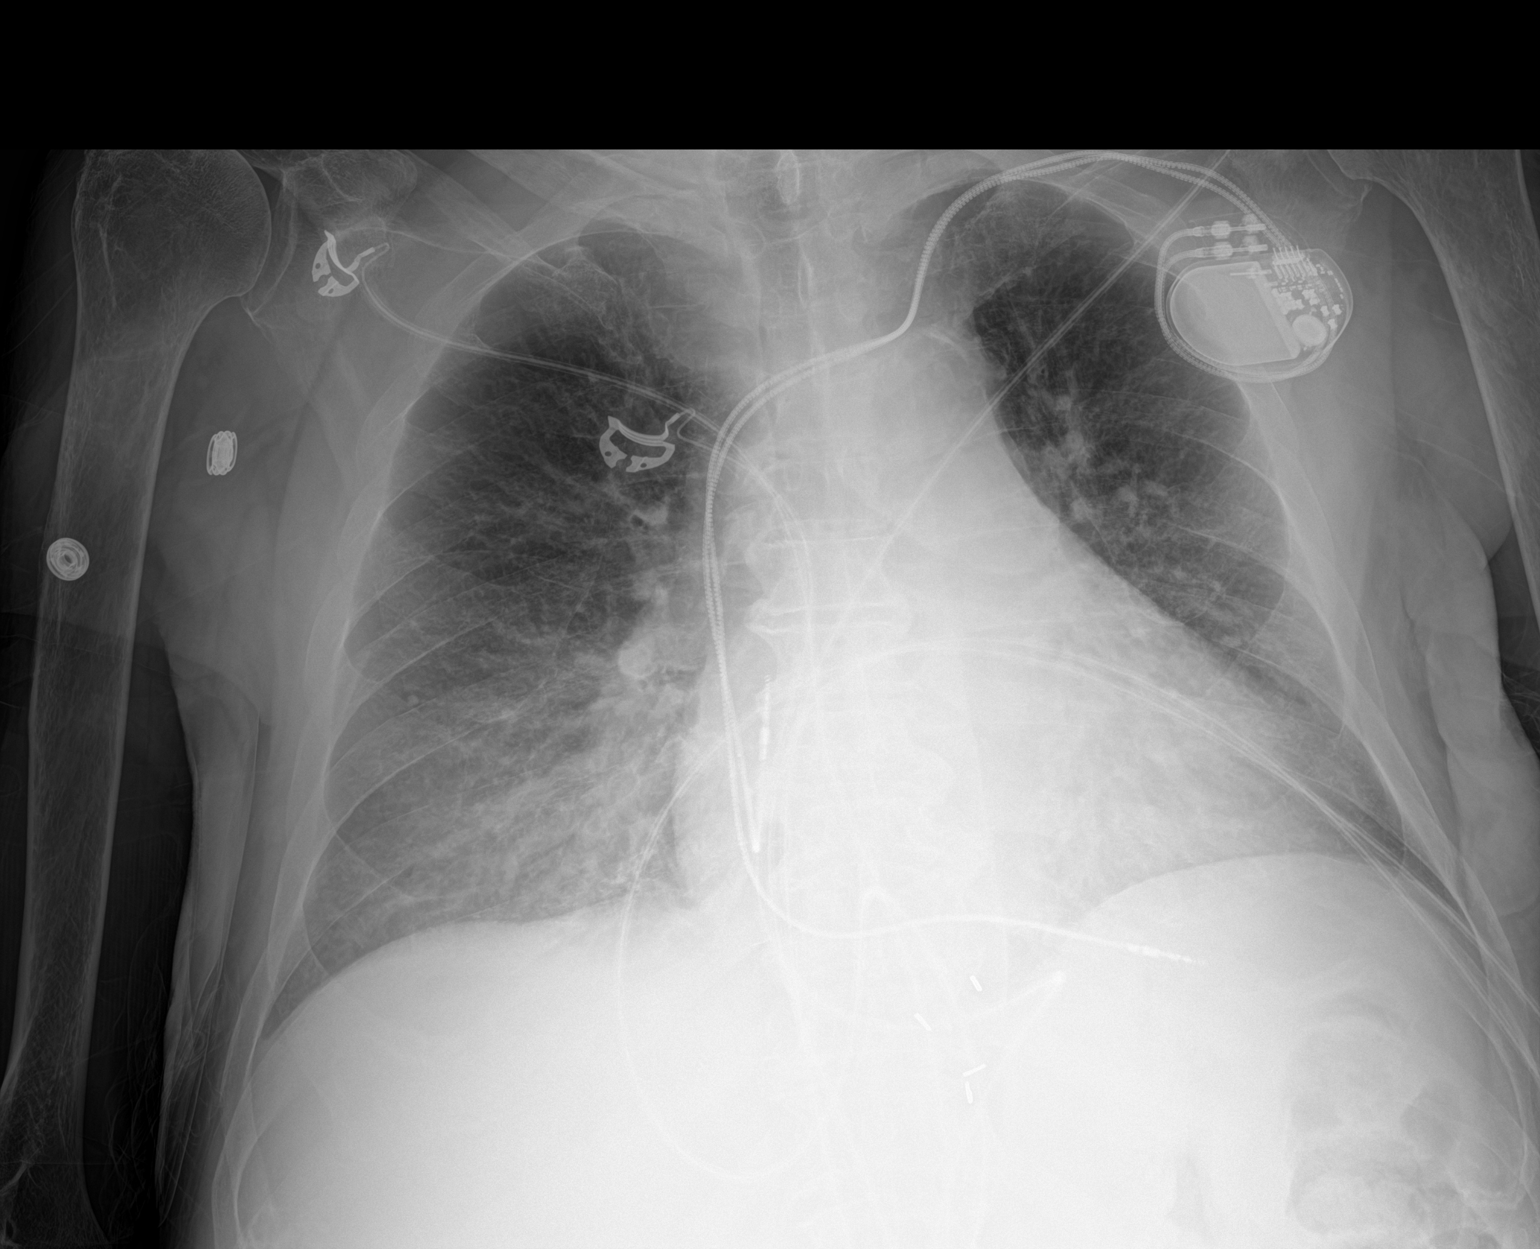

[1 of 1 positions shown; findings below may reference images not displayed]

FINDINGS: Single frontal view of the chest demonstrates stable dual lead
pacemaker. Cardiac silhouette is mildly enlarged. There is chronic
central vascular congestion, which has progressed slightly on this
exam. No airspace disease, effusion, or pneumothorax. No acute bony
abnormalities.
IMPRESSION: 1. Progressive central vascular congestion without overt edema.

## 2020-12-08 IMAGING — RF DG NASO G TUBE PLC W/FL W/RAD
1 series · 1 of 1 positions shown · IV contrast (omnipaque)
Comparison: None.

CLINICAL DATA: 79-year-old male in need of enteral access for
feeding.

EXAM:
NASO G TUBE PLACEMENT WITH FL AND WITH RAD
CONTRAST:  25mL OMNIPAQUE IOHEXOL 300 MG/ML  SOLN
FLUOROSCOPY TIME:  Fluoroscopy Time:  1 minutes and 18 seconds
Radiation Exposure Index (if provided by the fluoroscopic device):
10.8 mGy

[Series 1: fluoro_iodine_singleshot_bw · 0.17mm/px · 1 of 1 slices shown]
[im 1/1]
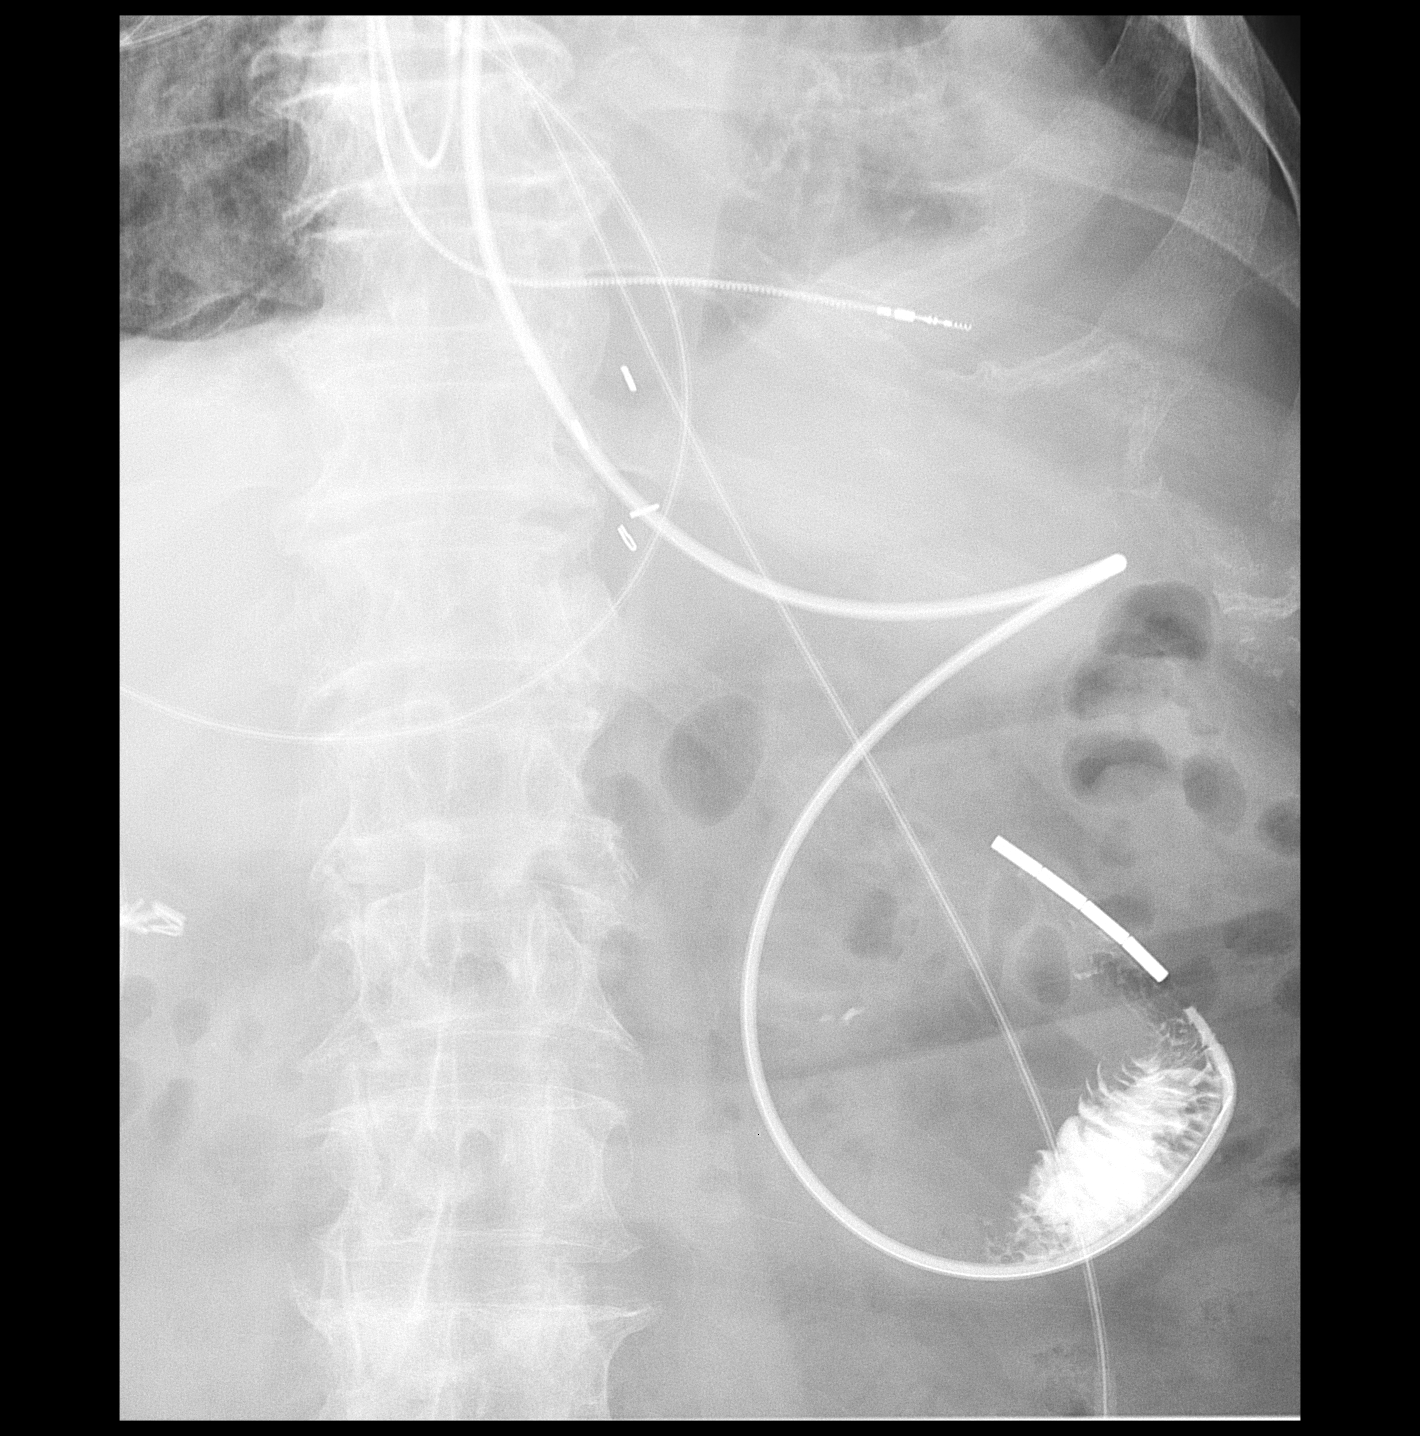

[1 of 1 positions shown; findings below may reference images not displayed]

FINDINGS: Feeding tube placed under fluoroscopic guidance, with tip extending
into the jejunum via the patient's gastrojejunostomy. Injection of
the tube confirmed proper placement of the tip within the small
bowel.
IMPRESSION: 1. Fluoroscopic guided placement of feeding tube into the jejunum,
as above.
